# Patient Record
Sex: Male | Born: 1945
Health system: Southern US, Community
[De-identification: ages and names within clinical notes are randomized; demographics above are authoritative.]

## PROBLEM LIST (undated history)

## (undated) DIAGNOSIS — I219 Acute myocardial infarction, unspecified: Secondary | ICD-10-CM

## (undated) DIAGNOSIS — I340 Nonrheumatic mitral (valve) insufficiency: Secondary | ICD-10-CM

## (undated) DIAGNOSIS — K922 Gastrointestinal hemorrhage, unspecified: Secondary | ICD-10-CM

## (undated) DIAGNOSIS — C3490 Malignant neoplasm of unspecified part of unspecified bronchus or lung: Secondary | ICD-10-CM

## (undated) DIAGNOSIS — J9621 Acute and chronic respiratory failure with hypoxia: Secondary | ICD-10-CM

## (undated) DIAGNOSIS — J9383 Other pneumothorax: Secondary | ICD-10-CM

## (undated) DIAGNOSIS — I712 Thoracic aortic aneurysm, without rupture: Secondary | ICD-10-CM

## (undated) DIAGNOSIS — I5042 Chronic combined systolic (congestive) and diastolic (congestive) heart failure: Secondary | ICD-10-CM

## (undated) DIAGNOSIS — I5022 Chronic systolic (congestive) heart failure: Secondary | ICD-10-CM

## (undated) DIAGNOSIS — Q211 Atrial septal defect, unspecified: Secondary | ICD-10-CM

## (undated) DIAGNOSIS — I639 Cerebral infarction, unspecified: Secondary | ICD-10-CM

## (undated) DIAGNOSIS — I499 Cardiac arrhythmia, unspecified: Secondary | ICD-10-CM

## (undated) DIAGNOSIS — R51 Headache: Secondary | ICD-10-CM

## (undated) DIAGNOSIS — J189 Pneumonia, unspecified organism: Secondary | ICD-10-CM

## (undated) DIAGNOSIS — I10 Essential (primary) hypertension: Secondary | ICD-10-CM

## (undated) DIAGNOSIS — Z95810 Presence of automatic (implantable) cardiac defibrillator: Secondary | ICD-10-CM

## (undated) DIAGNOSIS — E119 Type 2 diabetes mellitus without complications: Secondary | ICD-10-CM

## (undated) DIAGNOSIS — Q2549 Other congenital malformations of aorta: Secondary | ICD-10-CM

## (undated) DIAGNOSIS — I272 Pulmonary hypertension, unspecified: Secondary | ICD-10-CM

## (undated) DIAGNOSIS — I482 Chronic atrial fibrillation, unspecified: Secondary | ICD-10-CM

## (undated) DIAGNOSIS — E785 Hyperlipidemia, unspecified: Secondary | ICD-10-CM

## (undated) DIAGNOSIS — I701 Atherosclerosis of renal artery: Secondary | ICD-10-CM

## (undated) DIAGNOSIS — I629 Nontraumatic intracranial hemorrhage, unspecified: Secondary | ICD-10-CM

## (undated) DIAGNOSIS — R9430 Abnormal result of cardiovascular function study, unspecified: Secondary | ICD-10-CM

## (undated) DIAGNOSIS — Z950 Presence of cardiac pacemaker: Secondary | ICD-10-CM

## (undated) DIAGNOSIS — J449 Chronic obstructive pulmonary disease, unspecified: Secondary | ICD-10-CM

## (undated) DIAGNOSIS — I509 Heart failure, unspecified: Secondary | ICD-10-CM

## (undated) DIAGNOSIS — K635 Polyp of colon: Secondary | ICD-10-CM

## (undated) DIAGNOSIS — I38 Endocarditis, valve unspecified: Secondary | ICD-10-CM

## (undated) DIAGNOSIS — Z953 Presence of xenogenic heart valve: Secondary | ICD-10-CM

## (undated) DIAGNOSIS — T8209XA Other mechanical complication of heart valve prosthesis, initial encounter: Secondary | ICD-10-CM

## (undated) DIAGNOSIS — I4891 Unspecified atrial fibrillation: Secondary | ICD-10-CM

## (undated) HISTORY — DX: Atherosclerosis of renal artery: I70.1

## (undated) HISTORY — DX: Other mechanical complication of heart valve prosthesis, initial encounter: T82.09XA

## (undated) HISTORY — PX: HERNIA REPAIR: SHX51

## (undated) HISTORY — DX: Chronic combined systolic (congestive) and diastolic (congestive) heart failure: I50.42

## (undated) HISTORY — DX: Chronic obstructive pulmonary disease, unspecified: J44.9

## (undated) HISTORY — DX: Nonrheumatic mitral (valve) insufficiency: I34.0

## (undated) HISTORY — PX: TOE SURGERY: SHX1073

## (undated) HISTORY — PX: PENILE PROSTHESIS IMPLANT: SHX240

## (undated) HISTORY — PX: INSERT / REPLACE / REMOVE PACEMAKER: SUR710

## (undated) HISTORY — PX: APPENDECTOMY: SHX54

## (undated) HISTORY — DX: Endocarditis, valve unspecified: I38

## (undated) HISTORY — DX: Atrial septal defect: Q21.1

## (undated) HISTORY — DX: Nontraumatic intracranial hemorrhage, unspecified: I62.9

## (undated) HISTORY — PX: CARDIAC CATHETERIZATION: SHX172

## (undated) HISTORY — PX: MASS BIOPSY: SHX5445

## (undated) HISTORY — PX: THORACOTOMY: SUR1349

## (undated) HISTORY — DX: Other pneumothorax: J93.83

## (undated) HISTORY — DX: Hyperlipidemia, unspecified: E78.5

## (undated) HISTORY — PX: CARDIAC VALVE REPLACEMENT: SHX585

## (undated) HISTORY — DX: Other congenital malformations of aorta: Q25.49

## (undated) HISTORY — DX: Unspecified atrial fibrillation: I48.91

## (undated) HISTORY — DX: Polyp of colon: K63.5

## (undated) HISTORY — PX: TONSILLECTOMY: SUR1361

## (undated) HISTORY — PX: CARDIAC DEFIBRILLATOR PLACEMENT: SHX171

## (undated) HISTORY — PX: CATARACT EXTRACTION W/ INTRAOCULAR LENS  IMPLANT, BILATERAL: SHX1307

## (undated) HISTORY — DX: Abnormal result of cardiovascular function study, unspecified: R94.30

## (undated) HISTORY — DX: Atrial septal defect, unspecified: Q21.10

## (undated) HISTORY — DX: Essential (primary) hypertension: I10

## (undated) HISTORY — DX: Presence of xenogenic heart valve: Z95.3

## (undated) HISTORY — DX: Pulmonary hypertension, unspecified: I27.20

---

## 1998-08-20 ENCOUNTER — Ambulatory Visit (HOSPITAL_COMMUNITY): Admission: RE | Admit: 1998-08-20 | Discharge: 1998-08-20 | Payer: Self-pay | Admitting: Unknown Physician Specialty

## 2000-11-27 ENCOUNTER — Encounter: Payer: Self-pay | Admitting: Family Medicine

## 2000-11-27 ENCOUNTER — Ambulatory Visit (HOSPITAL_COMMUNITY): Admission: RE | Admit: 2000-11-27 | Discharge: 2000-11-27 | Payer: Self-pay | Admitting: Family Medicine

## 2001-05-16 ENCOUNTER — Encounter: Payer: Self-pay | Admitting: Emergency Medicine

## 2001-05-16 ENCOUNTER — Inpatient Hospital Stay (HOSPITAL_COMMUNITY): Admission: EM | Admit: 2001-05-16 | Discharge: 2001-05-24 | Payer: Self-pay | Admitting: *Deleted

## 2001-05-17 ENCOUNTER — Encounter: Payer: Self-pay | Admitting: Internal Medicine

## 2001-05-19 ENCOUNTER — Encounter: Payer: Self-pay | Admitting: Internal Medicine

## 2001-05-20 ENCOUNTER — Encounter: Payer: Self-pay | Admitting: Internal Medicine

## 2001-05-21 ENCOUNTER — Encounter: Payer: Self-pay | Admitting: Internal Medicine

## 2001-05-24 ENCOUNTER — Encounter: Payer: Self-pay | Admitting: Internal Medicine

## 2001-08-31 ENCOUNTER — Emergency Department (HOSPITAL_COMMUNITY): Admission: EM | Admit: 2001-08-31 | Discharge: 2001-08-31 | Payer: Self-pay

## 2001-09-12 ENCOUNTER — Encounter: Payer: Self-pay | Admitting: Emergency Medicine

## 2001-09-12 ENCOUNTER — Inpatient Hospital Stay (HOSPITAL_COMMUNITY): Admission: EM | Admit: 2001-09-12 | Discharge: 2001-09-19 | Payer: Self-pay | Admitting: Emergency Medicine

## 2001-09-14 ENCOUNTER — Encounter: Payer: Self-pay | Admitting: Family Medicine

## 2003-04-29 ENCOUNTER — Emergency Department (HOSPITAL_COMMUNITY): Admission: EM | Admit: 2003-04-29 | Discharge: 2003-04-29 | Payer: Self-pay | Admitting: Emergency Medicine

## 2003-07-18 ENCOUNTER — Encounter: Admission: RE | Admit: 2003-07-18 | Discharge: 2003-07-18 | Payer: Self-pay | Admitting: General Surgery

## 2004-07-14 ENCOUNTER — Ambulatory Visit: Payer: Self-pay | Admitting: Family Medicine

## 2004-08-02 ENCOUNTER — Ambulatory Visit: Payer: Self-pay | Admitting: Family Medicine

## 2004-08-30 ENCOUNTER — Ambulatory Visit: Payer: Self-pay | Admitting: Family Medicine

## 2004-09-17 ENCOUNTER — Ambulatory Visit: Payer: Self-pay | Admitting: Internal Medicine

## 2004-09-23 ENCOUNTER — Ambulatory Visit: Payer: Self-pay | Admitting: Family Medicine

## 2004-10-28 ENCOUNTER — Ambulatory Visit: Payer: Self-pay | Admitting: Family Medicine

## 2004-12-14 ENCOUNTER — Ambulatory Visit: Payer: Self-pay | Admitting: Family Medicine

## 2005-01-13 ENCOUNTER — Ambulatory Visit: Payer: Self-pay | Admitting: Family Medicine

## 2005-03-01 ENCOUNTER — Ambulatory Visit: Payer: Self-pay | Admitting: Family Medicine

## 2005-03-31 ENCOUNTER — Ambulatory Visit: Payer: Self-pay | Admitting: Family Medicine

## 2005-05-04 ENCOUNTER — Ambulatory Visit: Payer: Self-pay | Admitting: Family Medicine

## 2005-05-11 ENCOUNTER — Ambulatory Visit: Payer: Self-pay | Admitting: Family Medicine

## 2005-06-06 ENCOUNTER — Ambulatory Visit: Payer: Self-pay | Admitting: Family Medicine

## 2005-07-07 ENCOUNTER — Ambulatory Visit: Payer: Self-pay | Admitting: Family Medicine

## 2005-09-30 ENCOUNTER — Ambulatory Visit: Payer: Self-pay | Admitting: Family Medicine

## 2005-09-30 ENCOUNTER — Encounter: Admission: RE | Admit: 2005-09-30 | Discharge: 2005-09-30 | Payer: Self-pay | Admitting: Family Medicine

## 2005-10-27 ENCOUNTER — Ambulatory Visit: Payer: Self-pay | Admitting: Family Medicine

## 2005-11-03 ENCOUNTER — Ambulatory Visit: Payer: Self-pay | Admitting: Family Medicine

## 2005-11-10 ENCOUNTER — Ambulatory Visit: Payer: Self-pay | Admitting: Family Medicine

## 2005-11-18 ENCOUNTER — Ambulatory Visit: Payer: Self-pay | Admitting: Gastroenterology

## 2005-12-14 ENCOUNTER — Encounter: Payer: Self-pay | Admitting: Gastroenterology

## 2005-12-14 ENCOUNTER — Ambulatory Visit: Payer: Self-pay | Admitting: Gastroenterology

## 2005-12-28 ENCOUNTER — Ambulatory Visit: Payer: Self-pay | Admitting: Family Medicine

## 2006-01-13 ENCOUNTER — Ambulatory Visit: Payer: Self-pay | Admitting: Family Medicine

## 2006-02-27 ENCOUNTER — Ambulatory Visit: Payer: Self-pay | Admitting: Family Medicine

## 2006-03-17 ENCOUNTER — Ambulatory Visit: Payer: Self-pay | Admitting: Family Medicine

## 2006-07-19 ENCOUNTER — Ambulatory Visit: Payer: Self-pay | Admitting: Family Medicine

## 2006-07-19 LAB — CONVERTED CEMR LAB: Microalb, Ur: 13.3 mg/dL — ABNORMAL HIGH (ref 0.0–1.9)

## 2006-08-15 ENCOUNTER — Ambulatory Visit: Payer: Self-pay | Admitting: Family Medicine

## 2006-09-14 ENCOUNTER — Emergency Department (HOSPITAL_COMMUNITY): Admission: EM | Admit: 2006-09-14 | Discharge: 2006-09-14 | Payer: Self-pay | Admitting: Emergency Medicine

## 2006-10-12 ENCOUNTER — Ambulatory Visit: Payer: Self-pay | Admitting: Family Medicine

## 2006-10-12 LAB — CONVERTED CEMR LAB
AST: 14 units/L (ref 0–37)
Albumin: 4.2 g/dL (ref 3.5–5.2)
Alkaline Phosphatase: 75 units/L (ref 39–117)
BUN: 12 mg/dL (ref 6–23)
Basophils Absolute: 0 10*3/uL (ref 0.0–0.1)
Calcium: 9.2 mg/dL (ref 8.4–10.5)
Chloride: 98 meq/L (ref 96–112)
Cholesterol: 134 mg/dL (ref 0–200)
Creatinine, Ser: 0.8 mg/dL (ref 0.4–1.5)
Eosinophils Absolute: 0.1 10*3/uL (ref 0.0–0.6)
GFR calc non Af Amer: 105 mL/min
HCT: 50.7 % (ref 39.0–52.0)
HDL: 38.7 mg/dL — ABNORMAL LOW (ref >39.0)
LDL Cholesterol: 72 mg/dL (ref 0–99)
MCHC: 35 g/dL (ref 30.0–36.0)
Monocytes Relative: 7.8 % (ref 3.0–11.0)
PSA: 0.73 ng/mL
PSA: 0.73 ng/mL (ref 0.10–4.00)
Platelets: 146 10*3/uL — ABNORMAL LOW (ref 150–400)
RBC: 5.41 M/uL (ref 4.22–5.81)
RDW: 14 % (ref 11.5–14.6)
Total Bilirubin: 3.4 mg/dL — ABNORMAL HIGH (ref 0.3–1.2)
Total CHOL/HDL Ratio: 3.5
Triglycerides: 115 mg/dL (ref 0–149)

## 2006-11-09 ENCOUNTER — Ambulatory Visit: Payer: Self-pay | Admitting: Family Medicine

## 2006-12-11 ENCOUNTER — Ambulatory Visit: Payer: Self-pay | Admitting: Family Medicine

## 2006-12-18 ENCOUNTER — Ambulatory Visit: Payer: Self-pay | Admitting: Family Medicine

## 2007-01-11 ENCOUNTER — Encounter: Payer: Self-pay | Admitting: Family Medicine

## 2007-01-11 ENCOUNTER — Ambulatory Visit: Payer: Self-pay | Admitting: Family Medicine

## 2007-01-11 DIAGNOSIS — K219 Gastro-esophageal reflux disease without esophagitis: Secondary | ICD-10-CM

## 2007-01-11 DIAGNOSIS — J449 Chronic obstructive pulmonary disease, unspecified: Secondary | ICD-10-CM

## 2007-02-07 ENCOUNTER — Ambulatory Visit: Payer: Self-pay | Admitting: Gastroenterology

## 2007-02-12 ENCOUNTER — Ambulatory Visit: Payer: Self-pay | Admitting: Family Medicine

## 2007-02-12 LAB — CONVERTED CEMR LAB: INR: 2.4

## 2007-03-15 ENCOUNTER — Ambulatory Visit: Payer: Self-pay | Admitting: Family Medicine

## 2007-03-15 LAB — CONVERTED CEMR LAB
INR: 2.2
Prothrombin Time: 18 s

## 2007-03-22 ENCOUNTER — Encounter: Payer: Self-pay | Admitting: Family Medicine

## 2007-03-22 ENCOUNTER — Ambulatory Visit: Payer: Self-pay | Admitting: Gastroenterology

## 2007-03-22 ENCOUNTER — Encounter: Payer: Self-pay | Admitting: Gastroenterology

## 2007-03-22 DIAGNOSIS — D126 Benign neoplasm of colon, unspecified: Secondary | ICD-10-CM | POA: Insufficient documentation

## 2007-04-19 ENCOUNTER — Ambulatory Visit: Payer: Self-pay | Admitting: Family Medicine

## 2007-05-01 IMAGING — CR DG CHEST 2V
2 series · 2 of 2 positions shown · non-contrast
Comparison: 07/18/03.

CLINICAL DATA: Cough.    Atrial fibrillation.
 CHEST X-RAY:

[w chest pa]
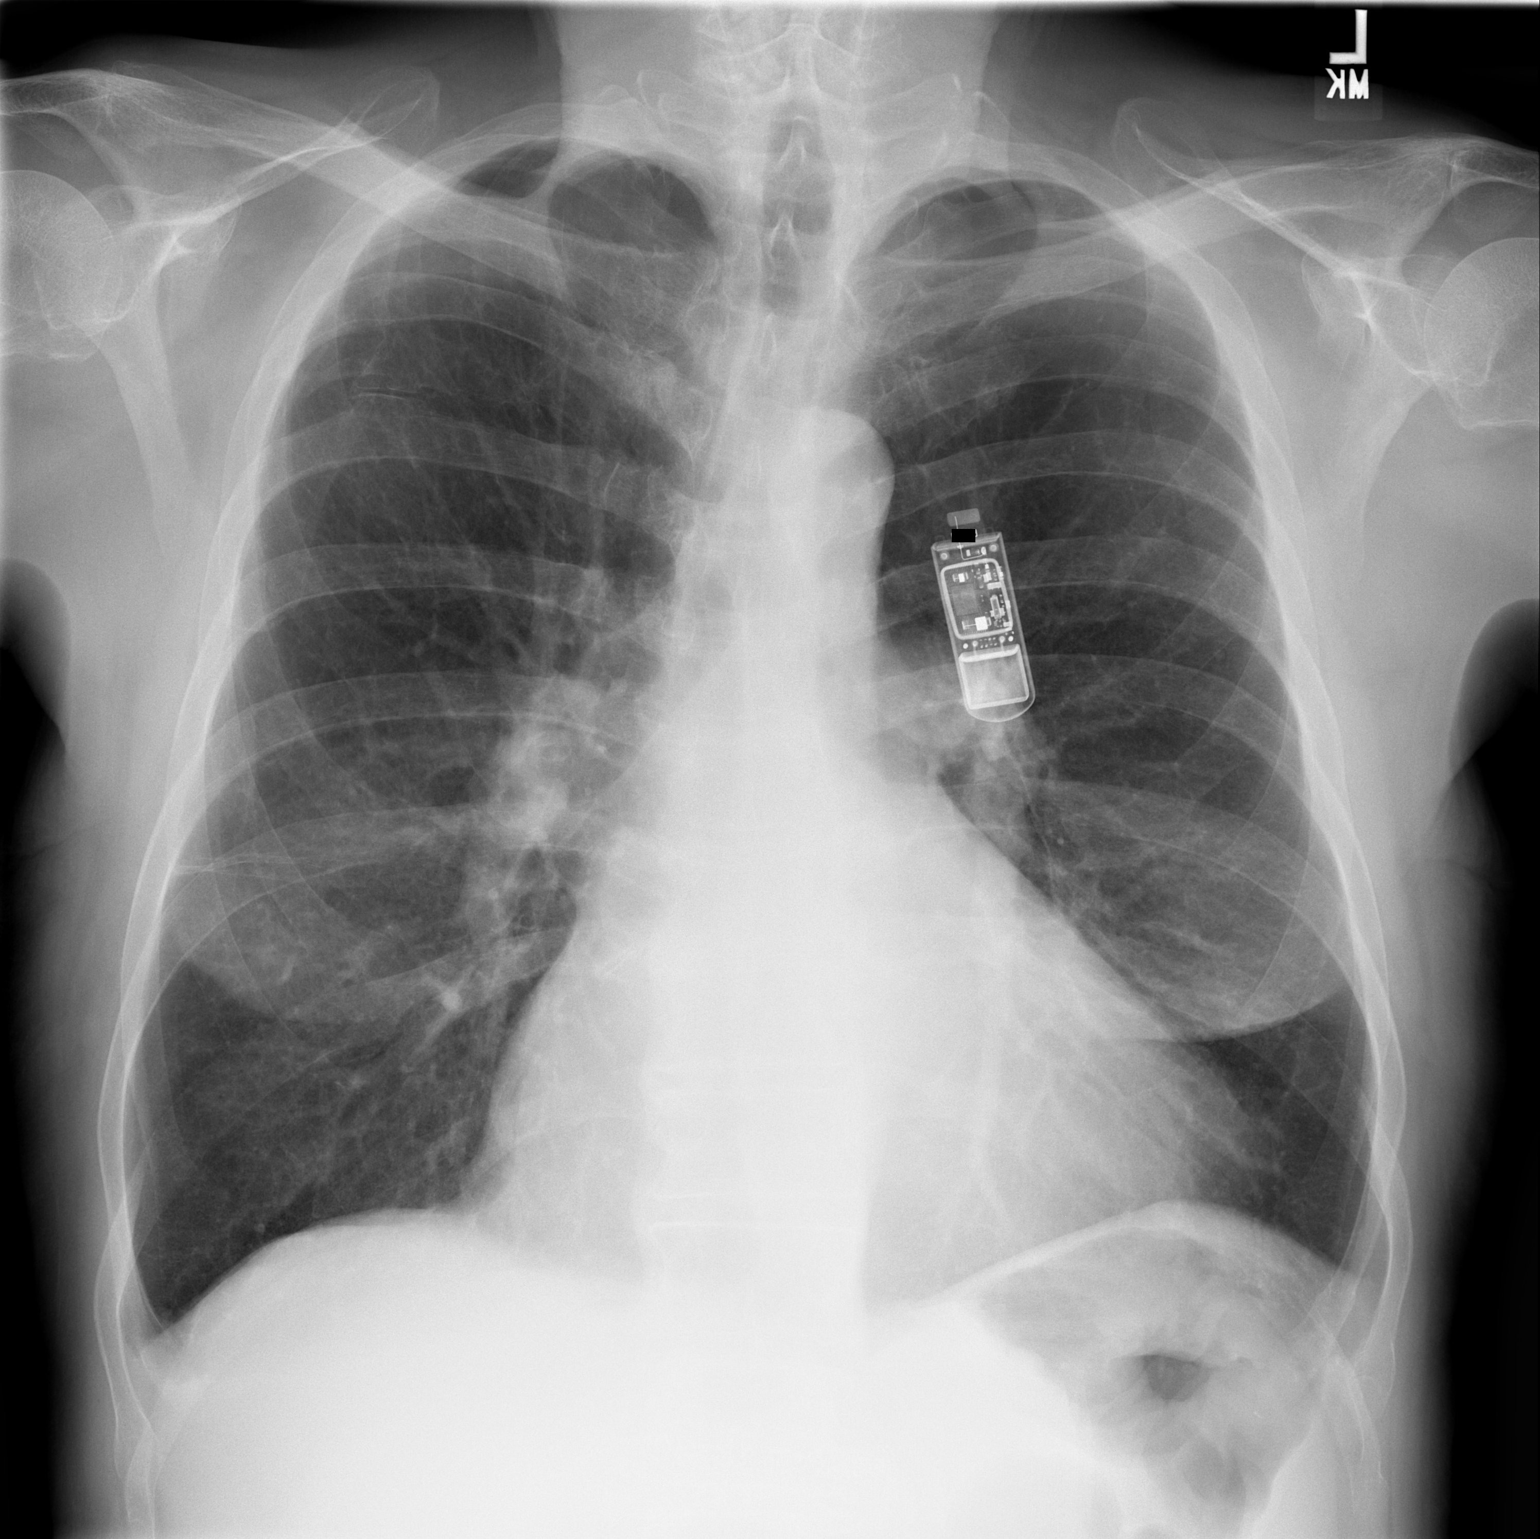

[w chest lat]
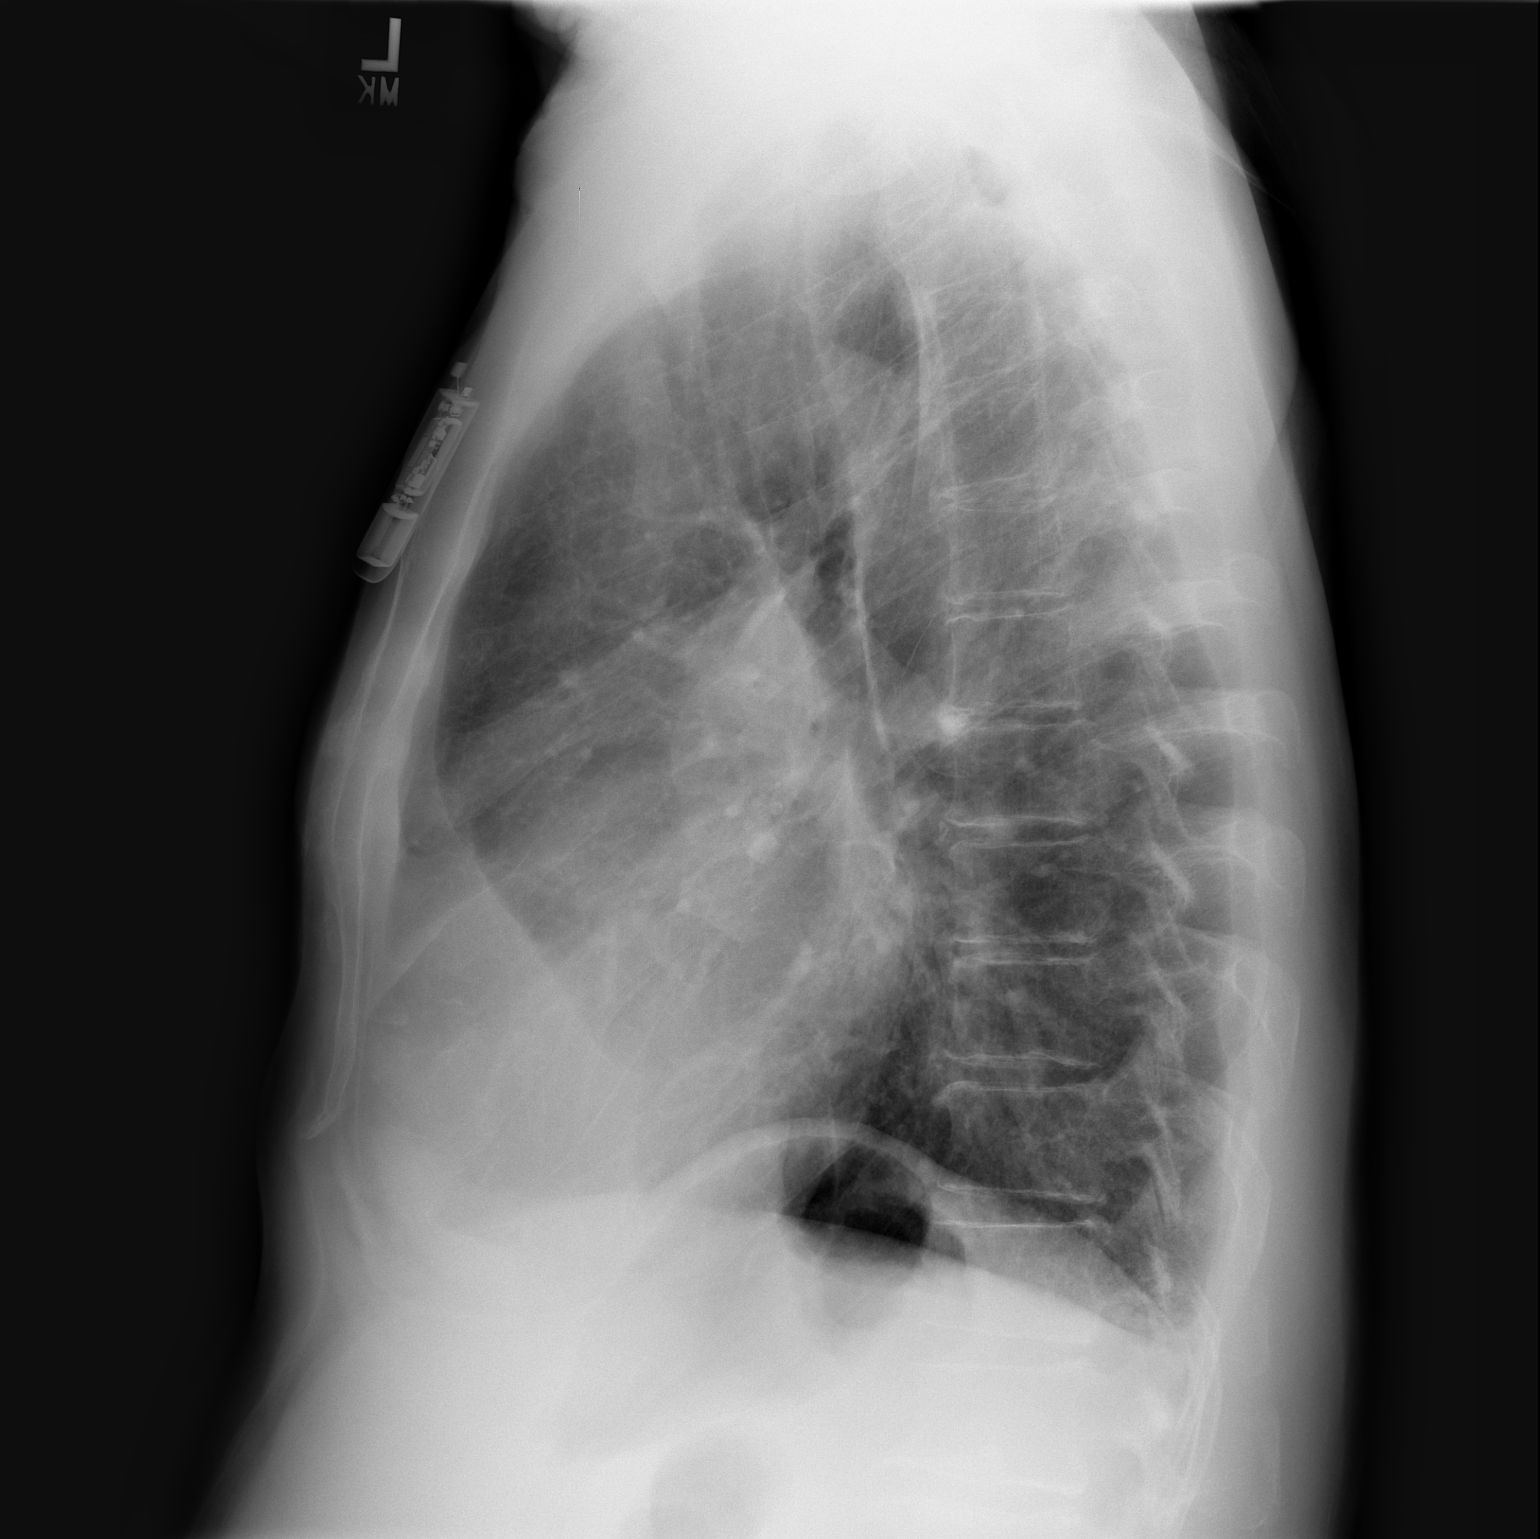

[2 of 2 positions shown; findings below may reference images not displayed]

The lungs are hyperaerated consistent with COPD.  Cardiomegaly is stable.  A loop recorder is noted overlying the anterior left chest.  No bony abnormality is seen.
IMPRESSION: Stable moderate cardiomegaly with COPD.  No active lung disease.

## 2007-05-21 ENCOUNTER — Ambulatory Visit: Payer: Self-pay | Admitting: Family Medicine

## 2007-05-21 ENCOUNTER — Encounter: Payer: Self-pay | Admitting: Family Medicine

## 2007-05-21 LAB — CONVERTED CEMR LAB
INR: 3.4
Prothrombin Time: 22.2 s

## 2007-06-18 ENCOUNTER — Ambulatory Visit: Payer: Self-pay | Admitting: Family Medicine

## 2007-06-18 LAB — CONVERTED CEMR LAB: Prothrombin Time: 19.5 s

## 2007-07-12 DIAGNOSIS — I639 Cerebral infarction, unspecified: Secondary | ICD-10-CM

## 2007-07-12 HISTORY — DX: Cerebral infarction, unspecified: I63.9

## 2007-07-16 ENCOUNTER — Ambulatory Visit: Payer: Self-pay | Admitting: Family Medicine

## 2007-07-16 LAB — CONVERTED CEMR LAB
INR: 3.2
Prothrombin Time: 21.5 s

## 2007-08-17 ENCOUNTER — Ambulatory Visit: Payer: Self-pay | Admitting: Family Medicine

## 2007-09-14 ENCOUNTER — Ambulatory Visit: Payer: Self-pay | Admitting: Family Medicine

## 2007-09-14 LAB — CONVERTED CEMR LAB: INR: 2.7

## 2007-10-12 ENCOUNTER — Ambulatory Visit: Payer: Self-pay | Admitting: Family Medicine

## 2007-10-12 LAB — CONVERTED CEMR LAB: Prothrombin Time: 20.4 s

## 2007-11-08 ENCOUNTER — Ambulatory Visit: Payer: Self-pay | Admitting: Family Medicine

## 2007-11-08 LAB — CONVERTED CEMR LAB: INR: 2.7

## 2007-11-10 ENCOUNTER — Ambulatory Visit: Payer: Self-pay | Admitting: Cardiology

## 2007-11-10 ENCOUNTER — Inpatient Hospital Stay (HOSPITAL_COMMUNITY): Admission: AD | Admit: 2007-11-10 | Discharge: 2007-11-15 | Payer: Self-pay | Admitting: Neurology

## 2007-11-10 ENCOUNTER — Encounter: Payer: Self-pay | Admitting: Emergency Medicine

## 2007-11-12 ENCOUNTER — Telehealth: Payer: Self-pay | Admitting: Family Medicine

## 2007-11-12 ENCOUNTER — Encounter (INDEPENDENT_AMBULATORY_CARE_PROVIDER_SITE_OTHER): Payer: Self-pay | Admitting: Neurology

## 2007-11-16 ENCOUNTER — Encounter: Admission: RE | Admit: 2007-11-16 | Discharge: 2008-02-14 | Payer: Self-pay | Admitting: Neurology

## 2007-11-29 ENCOUNTER — Ambulatory Visit: Payer: Self-pay

## 2007-12-11 ENCOUNTER — Ambulatory Visit: Payer: Self-pay | Admitting: Family Medicine

## 2007-12-27 ENCOUNTER — Telehealth: Payer: Self-pay | Admitting: *Deleted

## 2008-01-07 ENCOUNTER — Ambulatory Visit: Payer: Self-pay | Admitting: Cardiology

## 2008-01-16 ENCOUNTER — Encounter: Admission: RE | Admit: 2008-01-16 | Discharge: 2008-01-16 | Payer: Self-pay | Admitting: Neurology

## 2008-02-29 ENCOUNTER — Telehealth: Payer: Self-pay | Admitting: Gastroenterology

## 2008-03-20 ENCOUNTER — Telehealth: Payer: Self-pay | Admitting: Gastroenterology
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
## 2008-04-25 ENCOUNTER — Encounter: Payer: Self-pay | Admitting: Cardiology

## 2008-04-25 ENCOUNTER — Inpatient Hospital Stay (HOSPITAL_COMMUNITY): Admission: EM | Admit: 2008-04-25 | Discharge: 2008-05-08 | Payer: Self-pay | Admitting: Emergency Medicine

## 2008-04-25 ENCOUNTER — Ambulatory Visit: Payer: Self-pay | Admitting: *Deleted

## 2008-05-01 ENCOUNTER — Ambulatory Visit: Payer: Self-pay | Admitting: Internal Medicine

## 2008-05-01 ENCOUNTER — Encounter: Payer: Self-pay | Admitting: Cardiology

## 2008-05-29 ENCOUNTER — Ambulatory Visit: Payer: Self-pay | Admitting: Cardiology

## 2008-05-29 ENCOUNTER — Ambulatory Visit: Payer: Self-pay

## 2008-05-29 LAB — CONVERTED CEMR LAB
BUN: 16 mg/dL (ref 6–23)
Chloride: 97 meq/L (ref 96–112)
GFR calc Af Amer: 111 mL/min
GFR calc non Af Amer: 91 mL/min
Potassium: 4.3 meq/L (ref 3.5–5.1)
Sodium: 134 meq/L — ABNORMAL LOW (ref 135–145)

## 2008-06-07 ENCOUNTER — Inpatient Hospital Stay (HOSPITAL_COMMUNITY): Admission: EM | Admit: 2008-06-07 | Discharge: 2008-06-09 | Payer: Self-pay | Admitting: Emergency Medicine

## 2008-06-07 ENCOUNTER — Ambulatory Visit: Payer: Self-pay | Admitting: *Deleted

## 2008-06-10 ENCOUNTER — Ambulatory Visit: Payer: Self-pay

## 2008-06-10 ENCOUNTER — Encounter: Payer: Self-pay | Admitting: Infectious Diseases

## 2008-06-10 ENCOUNTER — Ambulatory Visit: Payer: Self-pay | Admitting: Thoracic Surgery (Cardiothoracic Vascular Surgery)

## 2008-06-10 ENCOUNTER — Ambulatory Visit: Payer: Self-pay | Admitting: Cardiology

## 2008-06-10 ENCOUNTER — Inpatient Hospital Stay (HOSPITAL_COMMUNITY): Admission: AD | Admit: 2008-06-10 | Discharge: 2008-06-20 | Payer: Self-pay | Admitting: Cardiology

## 2008-06-11 ENCOUNTER — Ambulatory Visit: Payer: Self-pay | Admitting: Infectious Diseases

## 2008-06-11 ENCOUNTER — Encounter: Payer: Self-pay | Admitting: Cardiology

## 2008-06-11 ENCOUNTER — Ambulatory Visit: Payer: Self-pay | Admitting: Surgery

## 2008-06-12 ENCOUNTER — Encounter: Payer: Self-pay | Admitting: Cardiology

## 2008-06-12 ENCOUNTER — Encounter: Payer: Self-pay | Admitting: Internal Medicine

## 2008-06-16 ENCOUNTER — Ambulatory Visit: Payer: Self-pay | Admitting: Dentistry

## 2008-06-17 ENCOUNTER — Encounter: Payer: Self-pay | Admitting: Thoracic Surgery (Cardiothoracic Vascular Surgery)

## 2008-06-18 ENCOUNTER — Encounter: Payer: Self-pay | Admitting: Cardiology

## 2008-06-25 ENCOUNTER — Ambulatory Visit: Payer: Self-pay | Admitting: Cardiology

## 2008-06-25 LAB — CONVERTED CEMR LAB
CO2: 33 meq/L — ABNORMAL HIGH (ref 19–32)
Chloride: 101 meq/L (ref 96–112)
GFR calc non Af Amer: 104 mL/min
Potassium: 5.5 meq/L — ABNORMAL HIGH (ref 3.5–5.1)
Sodium: 137 meq/L (ref 135–145)

## 2008-07-01 ENCOUNTER — Ambulatory Visit: Payer: Self-pay | Admitting: Cardiovascular Disease

## 2008-07-11 DIAGNOSIS — I219 Acute myocardial infarction, unspecified: Secondary | ICD-10-CM

## 2008-07-11 HISTORY — PX: AV NODE ABLATION: SHX1209

## 2008-07-11 HISTORY — DX: Acute myocardial infarction, unspecified: I21.9

## 2008-07-14 ENCOUNTER — Ambulatory Visit: Payer: Self-pay | Admitting: Internal Medicine

## 2008-07-14 ENCOUNTER — Inpatient Hospital Stay (HOSPITAL_COMMUNITY): Admission: AD | Admit: 2008-07-14 | Discharge: 2008-07-28 | Payer: Self-pay | Admitting: Internal Medicine

## 2008-07-14 ENCOUNTER — Ambulatory Visit: Payer: Self-pay | Admitting: Pulmonary Disease

## 2008-07-16 ENCOUNTER — Ambulatory Visit: Payer: Self-pay | Admitting: Thoracic Surgery (Cardiothoracic Vascular Surgery)

## 2008-07-17 ENCOUNTER — Other Ambulatory Visit: Payer: Self-pay | Admitting: Thoracic Surgery (Cardiothoracic Vascular Surgery)

## 2008-07-17 ENCOUNTER — Encounter: Payer: Self-pay | Admitting: Thoracic Surgery (Cardiothoracic Vascular Surgery)

## 2008-07-17 HISTORY — PX: MITRAL VALVE REPLACEMENT: SHX147

## 2008-07-17 HISTORY — PX: ASD REPAIR, SECUNDUM: SHX1195

## 2008-07-22 ENCOUNTER — Encounter: Payer: Self-pay | Admitting: Internal Medicine

## 2008-08-04 ENCOUNTER — Ambulatory Visit: Payer: Self-pay | Admitting: Thoracic Surgery (Cardiothoracic Vascular Surgery)

## 2008-08-04 ENCOUNTER — Encounter
Admission: RE | Admit: 2008-08-04 | Discharge: 2008-08-04 | Payer: Self-pay | Admitting: Thoracic Surgery (Cardiothoracic Vascular Surgery)

## 2008-08-07 ENCOUNTER — Ambulatory Visit: Payer: Self-pay

## 2008-08-07 ENCOUNTER — Encounter (INDEPENDENT_AMBULATORY_CARE_PROVIDER_SITE_OTHER): Payer: Self-pay | Admitting: *Deleted

## 2008-08-12 ENCOUNTER — Ambulatory Visit: Payer: Self-pay | Admitting: Cardiology

## 2008-08-13 ENCOUNTER — Ambulatory Visit: Payer: Self-pay | Admitting: Infectious Diseases

## 2008-08-27 ENCOUNTER — Telehealth: Payer: Self-pay | Admitting: *Deleted

## 2008-09-15 ENCOUNTER — Ambulatory Visit: Payer: Self-pay | Admitting: Thoracic Surgery (Cardiothoracic Vascular Surgery)

## 2008-10-24 ENCOUNTER — Ambulatory Visit: Payer: Self-pay | Admitting: Internal Medicine

## 2008-10-24 ENCOUNTER — Encounter: Payer: Self-pay | Admitting: Internal Medicine

## 2008-11-13 ENCOUNTER — Encounter: Payer: Self-pay | Admitting: Cardiology

## 2008-12-04 ENCOUNTER — Telehealth: Payer: Self-pay | Admitting: Cardiology

## 2008-12-04 ENCOUNTER — Ambulatory Visit: Payer: Self-pay | Admitting: Cardiology

## 2009-02-06 ENCOUNTER — Encounter (INDEPENDENT_AMBULATORY_CARE_PROVIDER_SITE_OTHER): Payer: Self-pay | Admitting: *Deleted

## 2009-02-11 ENCOUNTER — Telehealth: Payer: Self-pay | Admitting: Internal Medicine

## 2009-02-20 ENCOUNTER — Encounter: Payer: Self-pay | Admitting: Internal Medicine

## 2009-02-27 ENCOUNTER — Telehealth (INDEPENDENT_AMBULATORY_CARE_PROVIDER_SITE_OTHER): Payer: Self-pay | Admitting: *Deleted

## 2009-03-19 ENCOUNTER — Telehealth (INDEPENDENT_AMBULATORY_CARE_PROVIDER_SITE_OTHER): Payer: Self-pay | Admitting: *Deleted

## 2009-03-25 ENCOUNTER — Ambulatory Visit: Payer: Self-pay | Admitting: Family Medicine

## 2009-03-25 LAB — CONVERTED CEMR LAB
Albumin: 4.4 g/dL (ref 3.5–5.2)
BUN: 18 mg/dL (ref 6–23)
Basophils Absolute: 0 10*3/uL (ref 0.0–0.1)
Bilirubin Urine: NEGATIVE
Blood in Urine, dipstick: NEGATIVE
CO2: 33 meq/L — ABNORMAL HIGH (ref 19–32)
Calcium: 9.7 mg/dL (ref 8.4–10.5)
Cholesterol: 143 mg/dL (ref 0–200)
Eosinophils Absolute: 0.3 10*3/uL (ref 0.0–0.7)
Glucose, Bld: 171 mg/dL — ABNORMAL HIGH (ref 70–99)
Glucose, Urine, Semiquant: NEGATIVE
HDL: 42.2 mg/dL (ref >39.00)
Hemoglobin: 17.4 g/dL — ABNORMAL HIGH (ref 13.0–17.0)
Ketones, urine, test strip: NEGATIVE
Lymphocytes Relative: 12.3 % (ref 12.0–46.0)
Lymphs Abs: 1.1 10*3/uL (ref 0.7–4.0)
MCHC: 34.1 g/dL (ref 30.0–36.0)
Neutro Abs: 7.2 10*3/uL (ref 1.4–7.7)
Platelets: 161 10*3/uL (ref 150.0–400.0)
Protein, U semiquant: NEGATIVE
RDW: 14.5 % (ref 11.5–14.6)
Sodium: 145 meq/L (ref 135–145)
Specific Gravity, Urine: 1.02
TSH: 2.04 microintl units/mL (ref 0.35–5.50)
Total Bilirubin: 2.8 mg/dL — ABNORMAL HIGH (ref 0.3–1.2)
Triglycerides: 128 mg/dL (ref 0.0–149.0)
VLDL: 25.6 mg/dL (ref 0.0–40.0)
WBC Urine, dipstick: NEGATIVE
pH: 6

## 2009-03-31 ENCOUNTER — Telehealth (INDEPENDENT_AMBULATORY_CARE_PROVIDER_SITE_OTHER): Payer: Self-pay | Admitting: *Deleted

## 2009-04-01 ENCOUNTER — Ambulatory Visit: Payer: Self-pay | Admitting: Family Medicine

## 2009-04-01 DIAGNOSIS — R7309 Other abnormal glucose: Secondary | ICD-10-CM

## 2009-04-03 LAB — CONVERTED CEMR LAB
Creatinine,U: 39.3 mg/dL
GFR calc non Af Amer: 90.93 mL/min (ref >60)
Hgb A1c MFr Bld: 6.3 % (ref 4.6–6.5)
Microalb Creat Ratio: 63.6 mg/g — ABNORMAL HIGH (ref 0.0–30.0)
Microalb, Ur: 2.5 mg/dL — ABNORMAL HIGH (ref 0.0–1.9)
Potassium: 4.1 meq/L (ref 3.5–5.1)
Sodium: 141 meq/L (ref 135–145)

## 2009-04-14 ENCOUNTER — Ambulatory Visit: Payer: Self-pay | Admitting: Family Medicine

## 2009-04-17 ENCOUNTER — Encounter: Payer: Self-pay | Admitting: Internal Medicine

## 2009-04-20 ENCOUNTER — Telehealth: Payer: Self-pay | Admitting: Family Medicine

## 2009-04-20 ENCOUNTER — Ambulatory Visit: Payer: Self-pay | Admitting: Internal Medicine

## 2009-04-20 DIAGNOSIS — C3412 Malignant neoplasm of upper lobe, left bronchus or lung: Secondary | ICD-10-CM

## 2009-04-29 ENCOUNTER — Encounter: Payer: Self-pay | Admitting: Internal Medicine

## 2009-06-09 ENCOUNTER — Ambulatory Visit: Payer: Self-pay | Admitting: Cardiology

## 2009-06-10 IMAGING — CT CT HEAD W/O CM
1 series · 16 of 30 positions shown, 20 images · non-contrast
Comparison: None

CLINICAL DATA: Headache.  On Coumadin.

CT HEAD WITHOUT CONTRAST
TECHNIQUE: Contiguous axial images were obtained from the base of
the skull through the vertex without contrast.

[Series 2: headseq 4.8 h45s · axial · 0.43mm/px · z∈[-178,-26]mm · 16 of 36 slices shown, 20 images]
[im 2/36  brain]
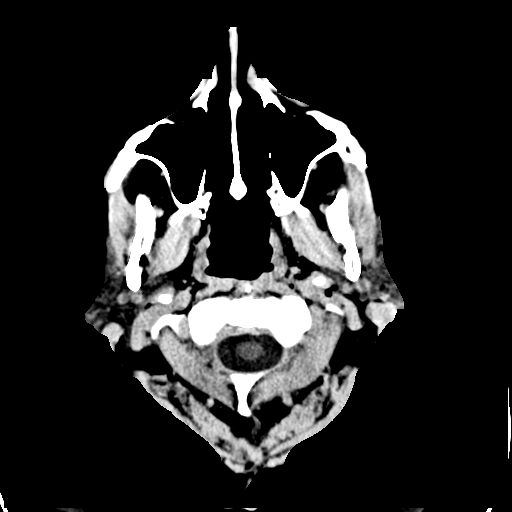
[im 2/36  bone]
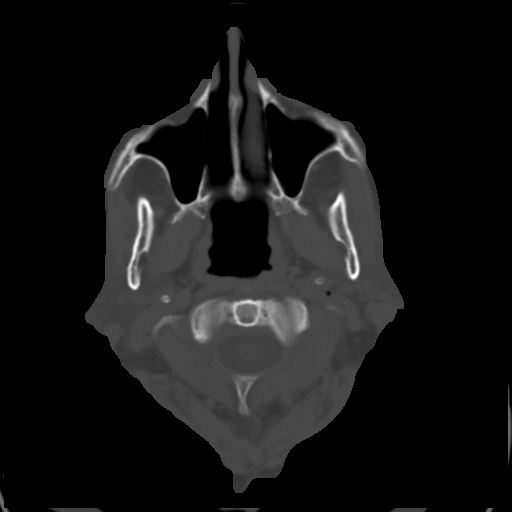
[im 4/36  brain]
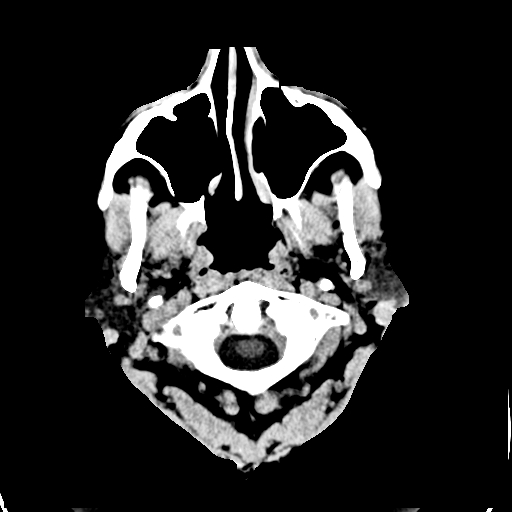
[im 7/36  brain]
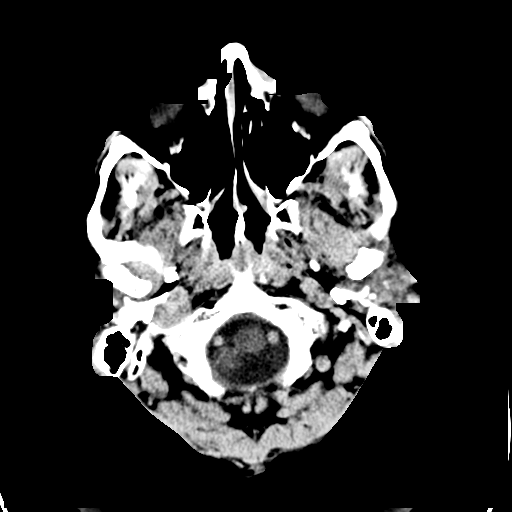
[im 9/36  brain]
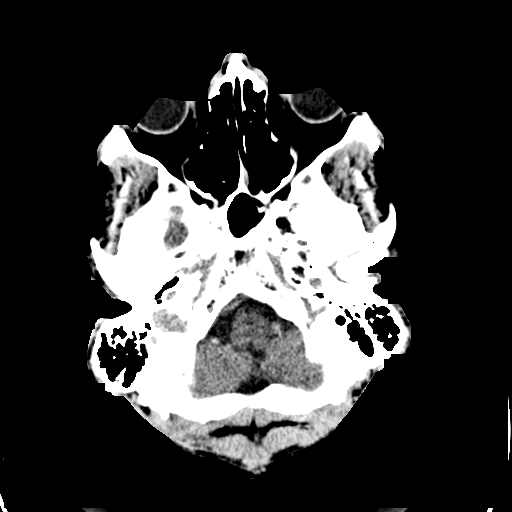
[im 10/36  brain]
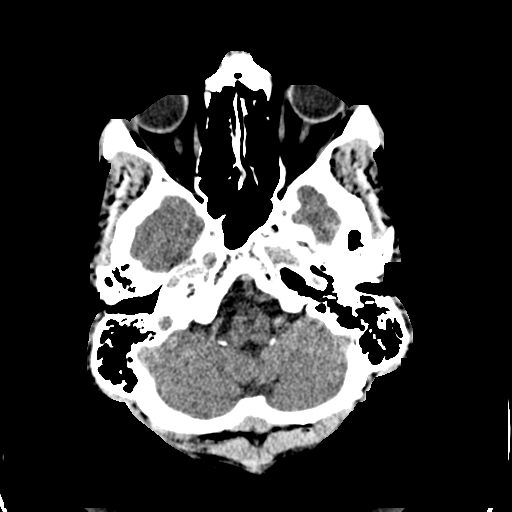
[im 10/36  bone]
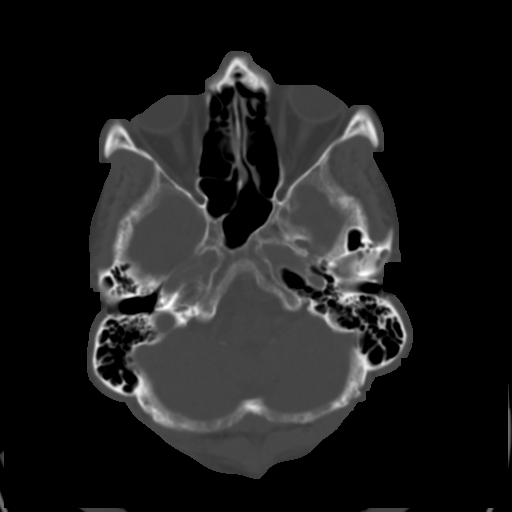
[im 13/36  brain]
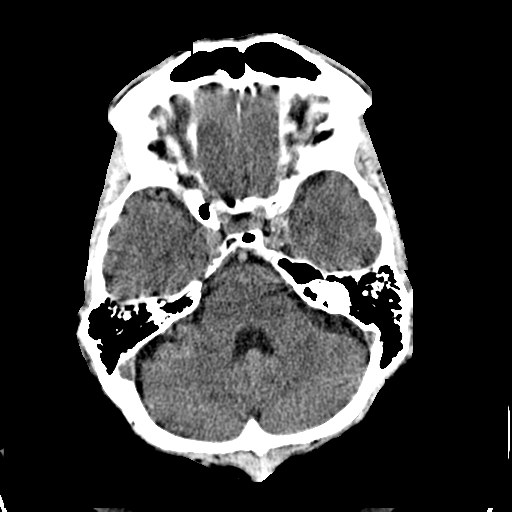
[im 15/36  brain]
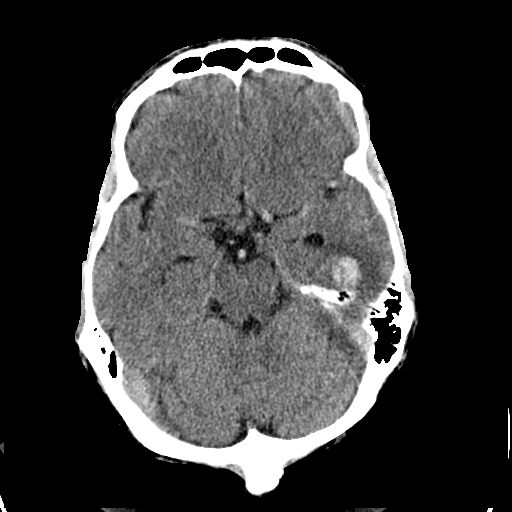
[im 17/36  brain]
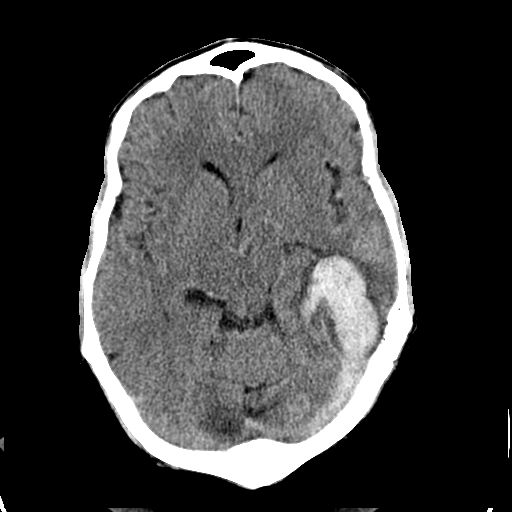
[im 19/36  brain]
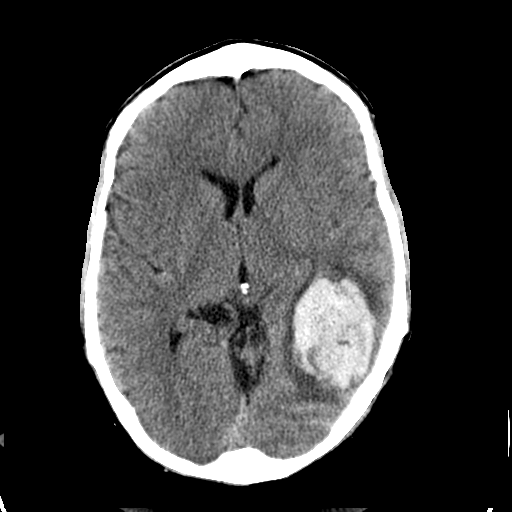
[im 19/36  bone]
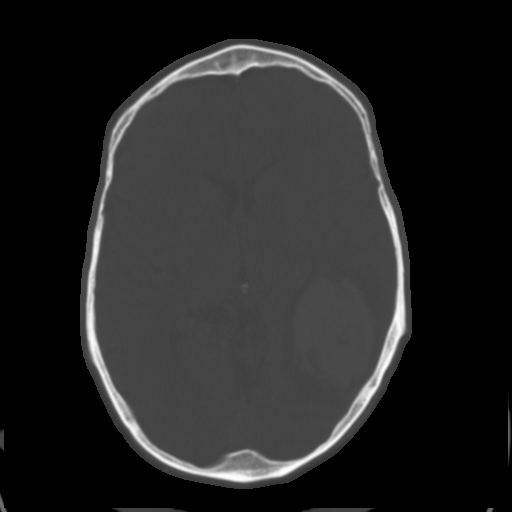
[im 21/36  brain]
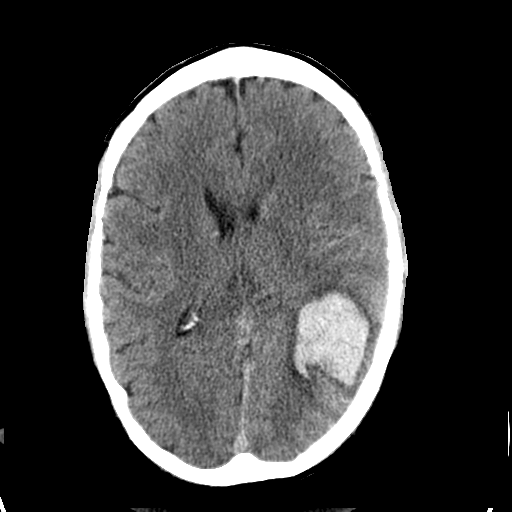
[im 23/36  brain]
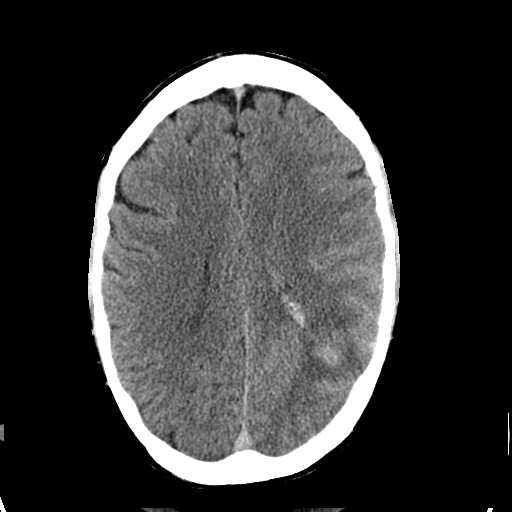
[im 26/36  brain]
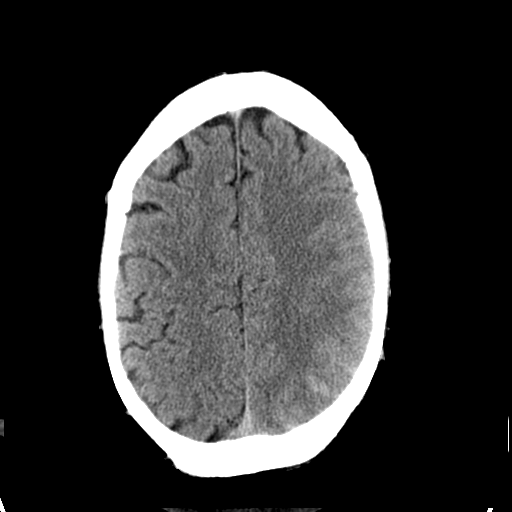
[im 27/36  brain]
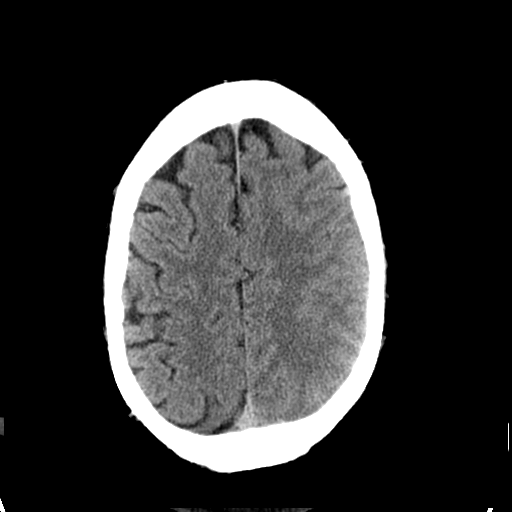
[im 27/36  bone]
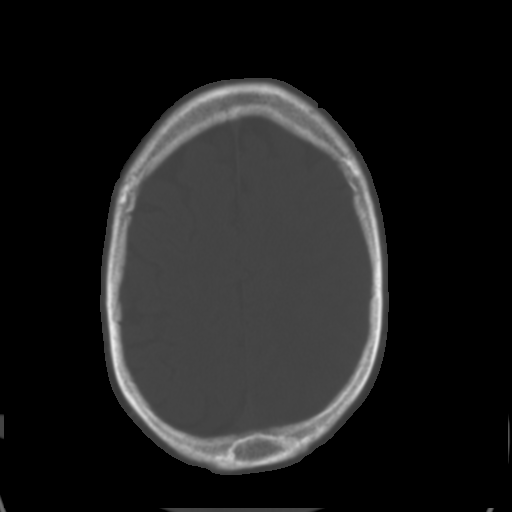
[im 29/36  brain]
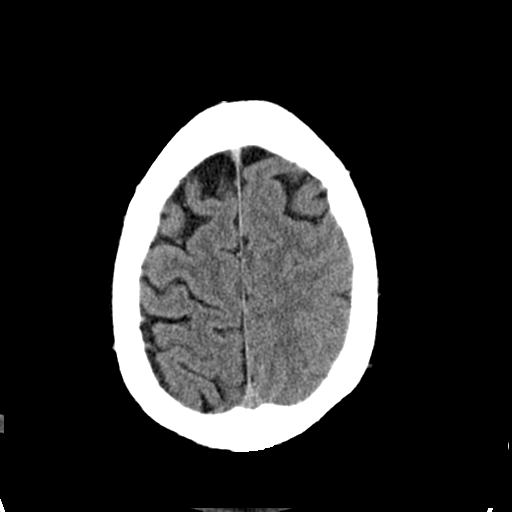
[im 32/36  brain]
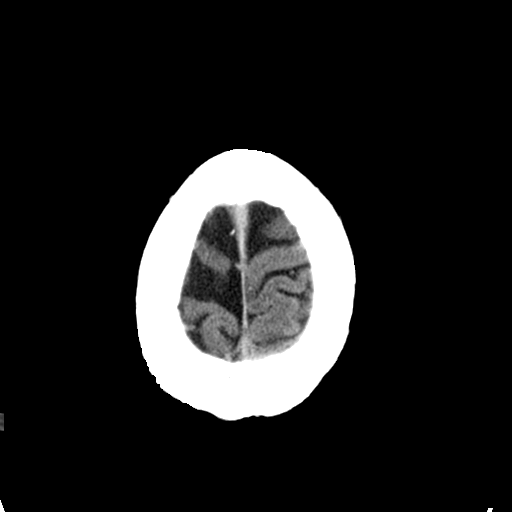
[im 34/36  brain]
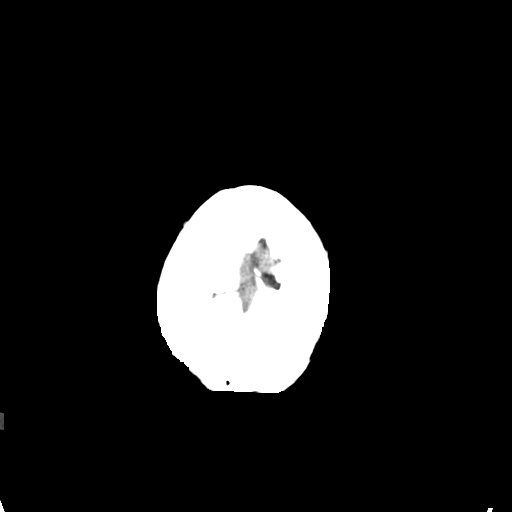

[16 of 30 positions shown; findings below may reference images not displayed]

FINDINGS: A 3.7 x 5 cm hematoma within the left temporoparietal
region is noted with adjacent edema and mass effect.  A small
amount of subarachnoid hemorrhage in the the left parietal region
is also identified.

No evidence of midline shift.

No evidence of cortical infarct or hydrocephalus.

The visualized bony calvarium is unremarkable.
IMPRESSION: 3.7 x 5 cm left temporoparietal hematoma with small amount of
adjacent subarachnoid hemorrhage.

Dr. Kushkaki notified of these results on 11/10/2007 at [DATE] p.m..

## 2009-06-10 IMAGING — CR DG CHEST 2V
2 series · 2 of 2 positions shown · non-contrast
Comparison: 09/16/2006

CLINICAL DATA: Headache

CHEST - 2 VIEW

[w chest pa]
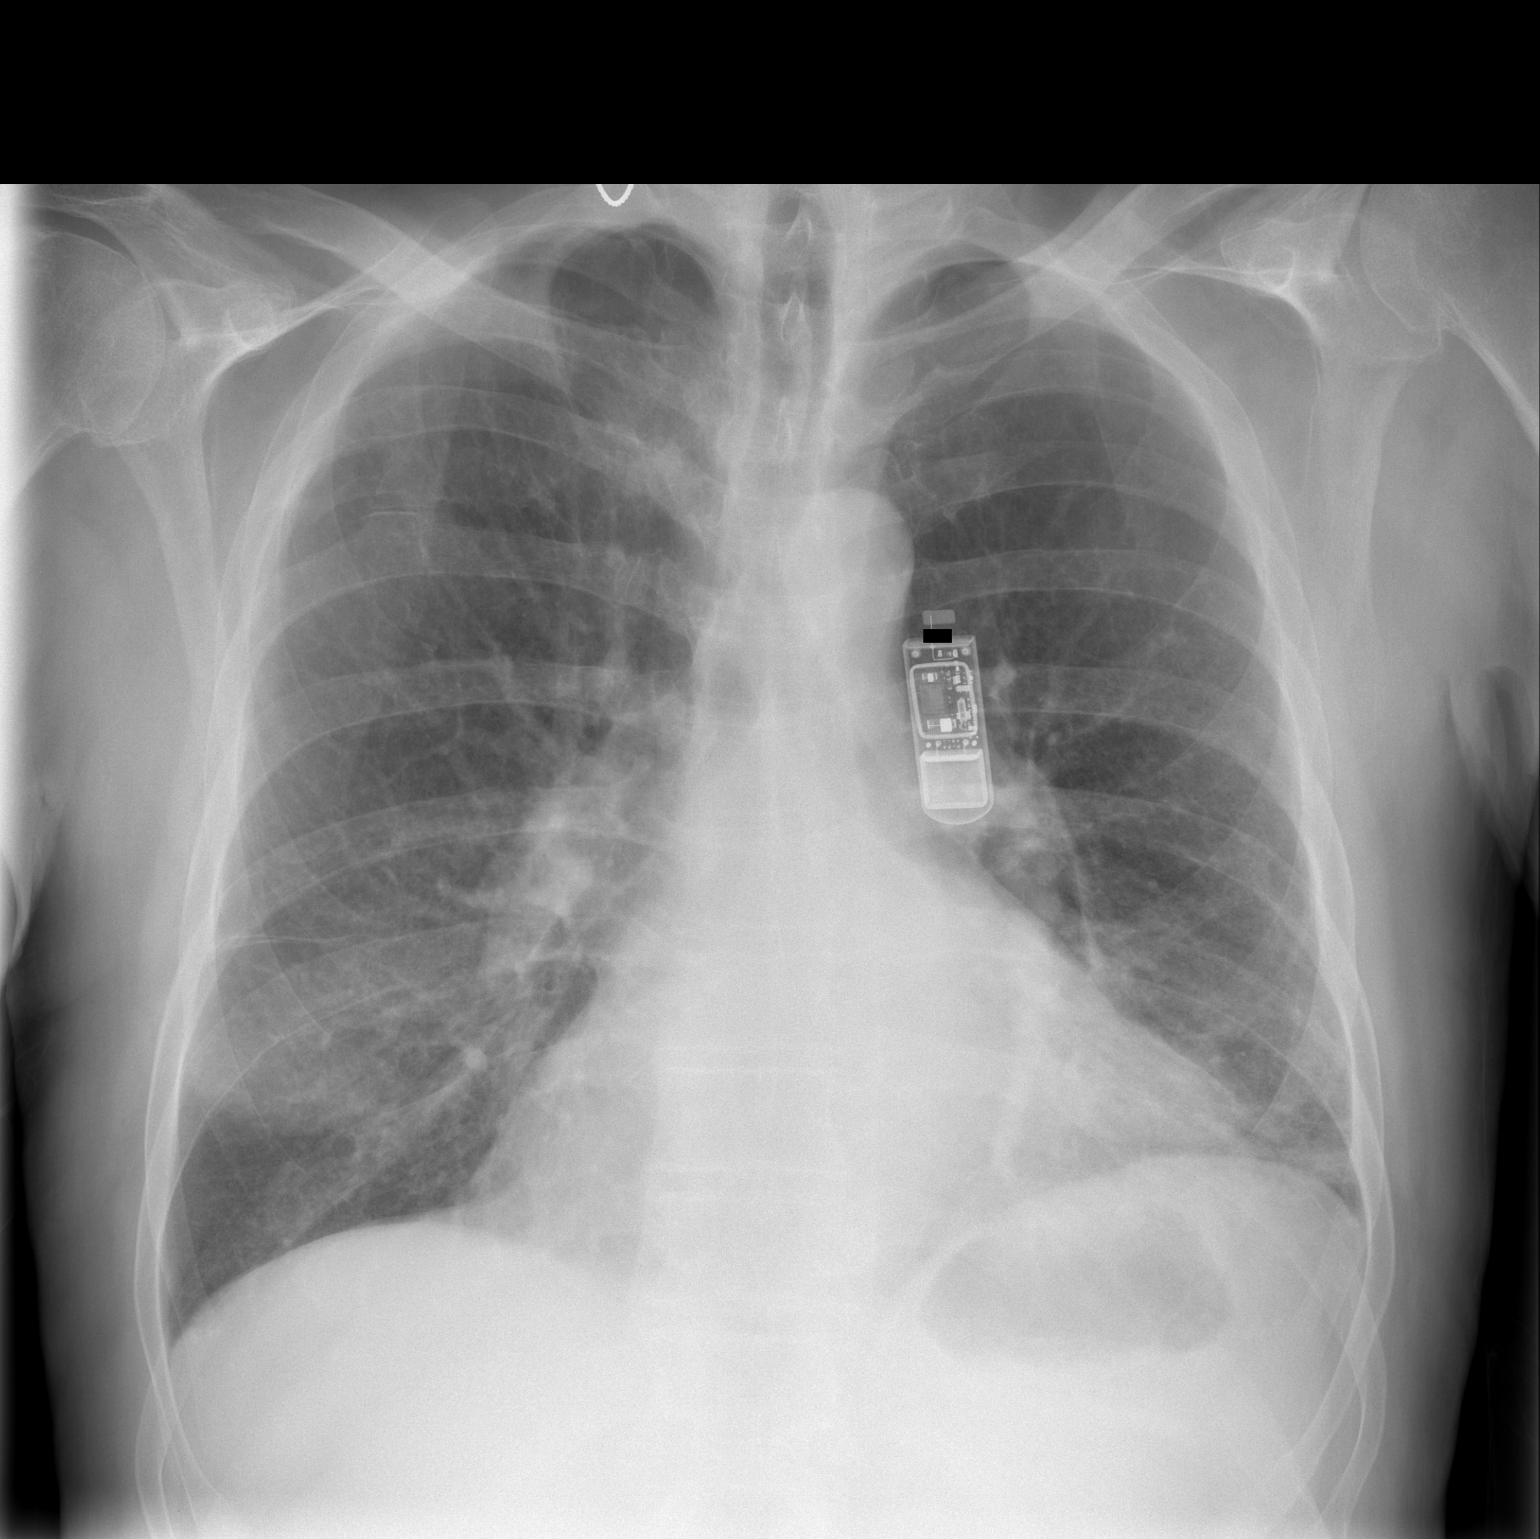

[w chest lat]
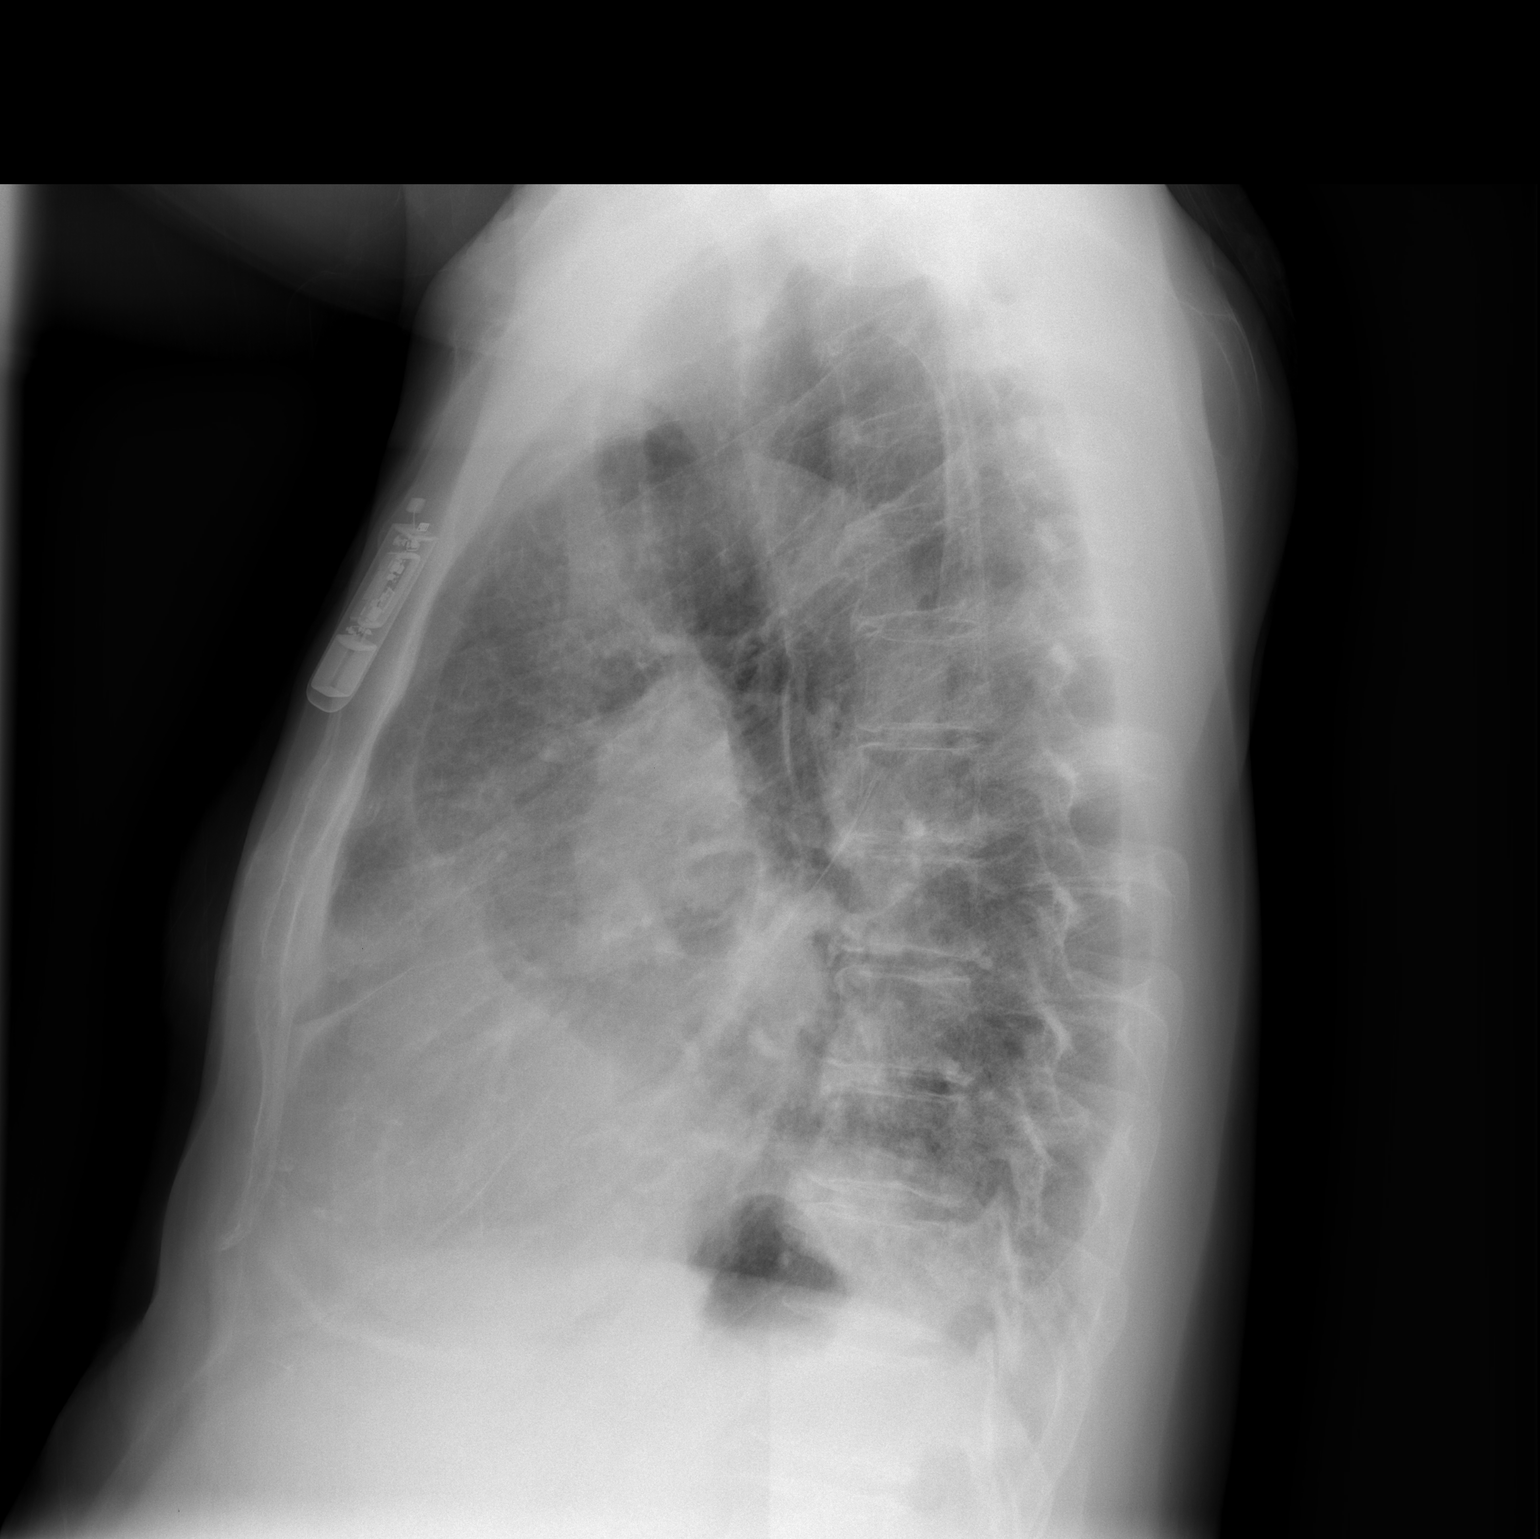

[2 of 2 positions shown; findings below may reference images not displayed]

FINDINGS: A neural stimulator is noted over the left hemithorax.
Cardiomegaly, no edema.  Left lower lobe atelectasis noted with
elevation of the left hemidiaphragm.  Right lung is grossly clear.
IMPRESSION: Left lower lobe atelectasis.

## 2009-06-11 ENCOUNTER — Ambulatory Visit: Payer: Self-pay | Admitting: Family Medicine

## 2009-06-13 IMAGING — CT CT HEAD W/O CM
1 series · 16 of 30 positions shown, 20 images · non-contrast
Comparison: 11/11/2007

CLINICAL DATA: Intracranial hemorrhage.  Confusion.  Headache.

CT HEAD WITHOUT CONTRAST
TECHNIQUE: Contiguous axial images were obtained from the base of
the skull through the vertex without contrast.

[Series 2: brain · axial · 0.47mm/px · z∈[+159,+293]mm · 16 of 32 slices shown, 20 images]
[im 2/32  brain]
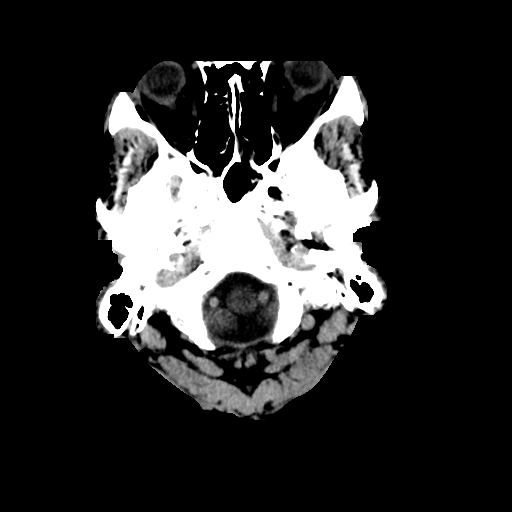
[im 2/32  bone]
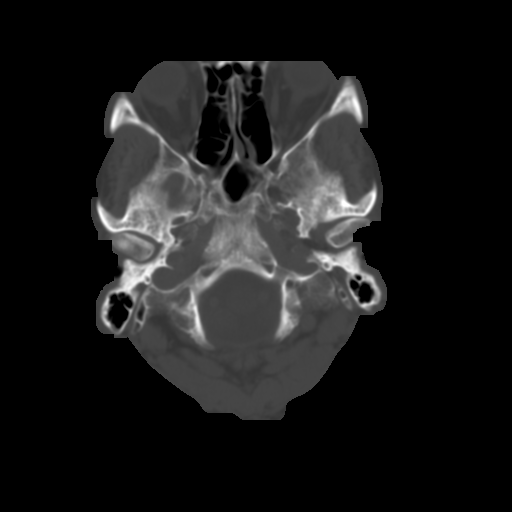
[im 4/32  brain]
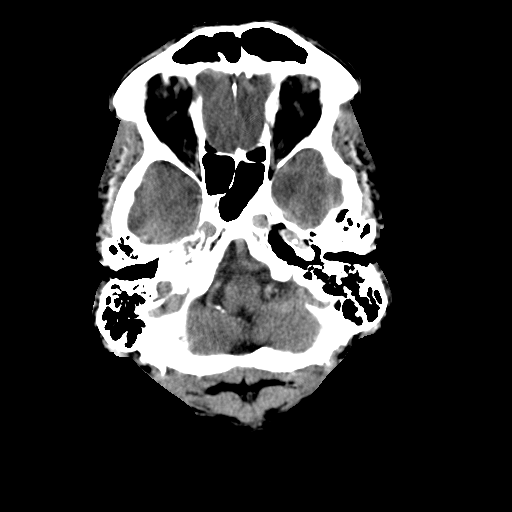
[im 6/32  brain]
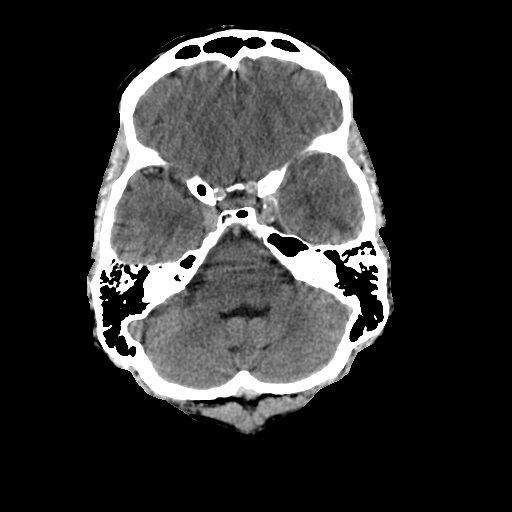
[im 8/32  brain]
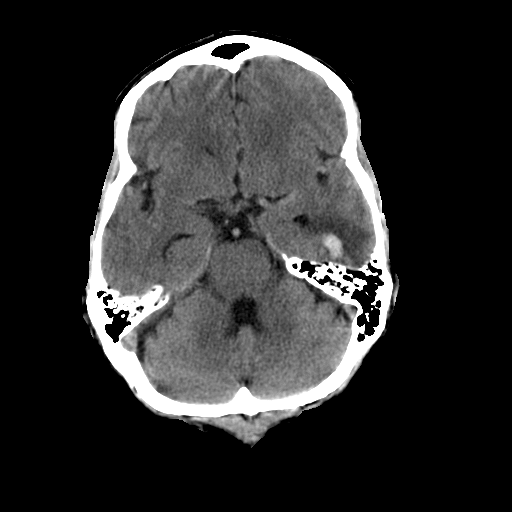
[im 9/32  brain]
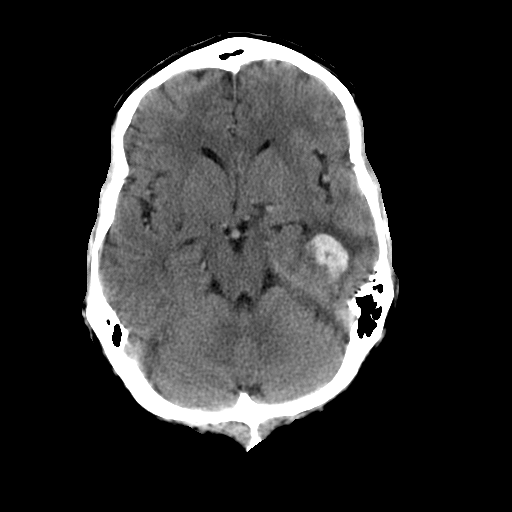
[im 9/32  bone]
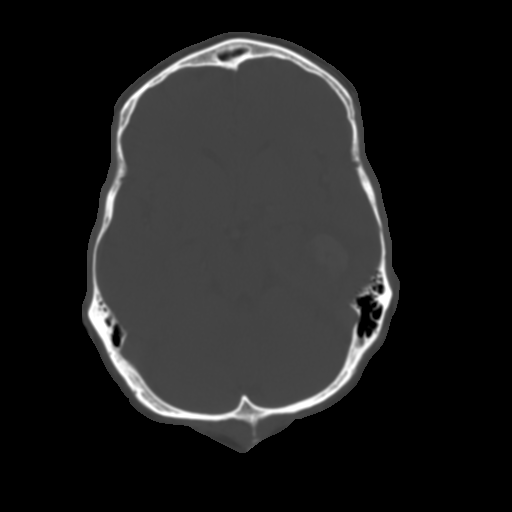
[im 11/32  brain]
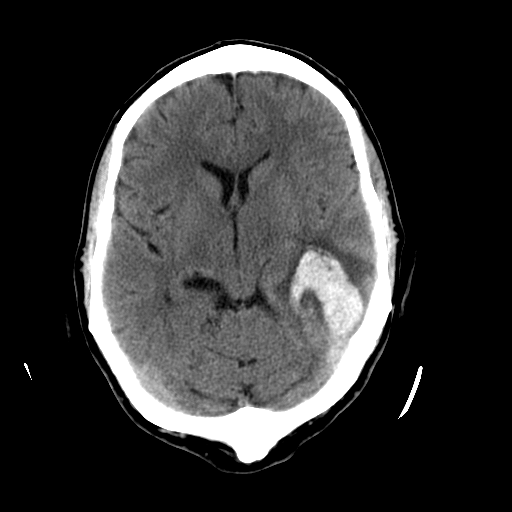
[im 13/32  brain]
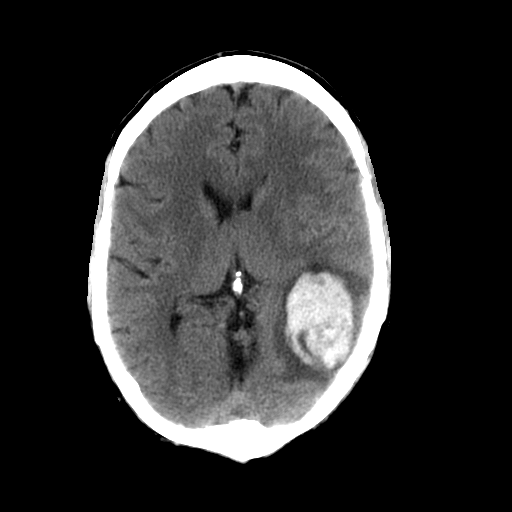
[im 15/32  brain]
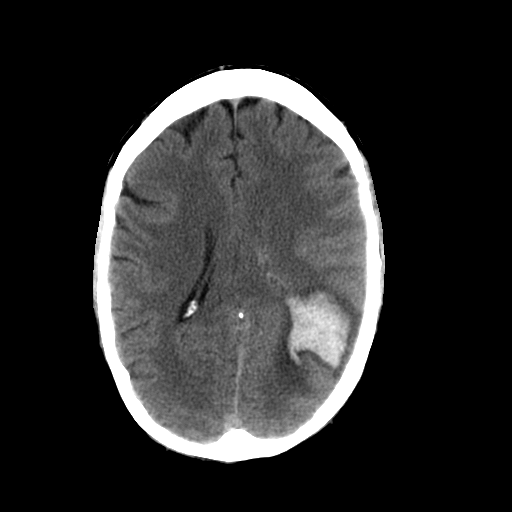
[im 17/32  brain]
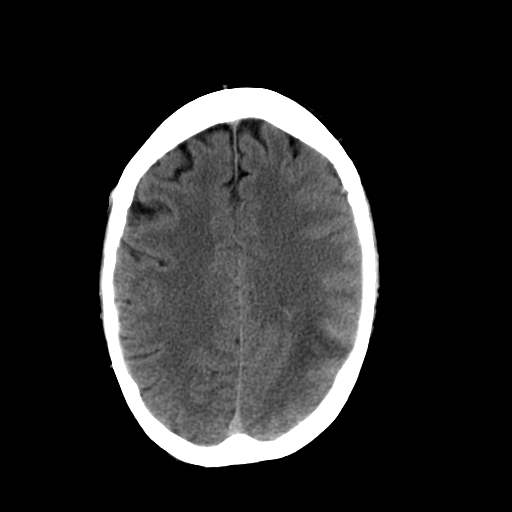
[im 17/32  bone]
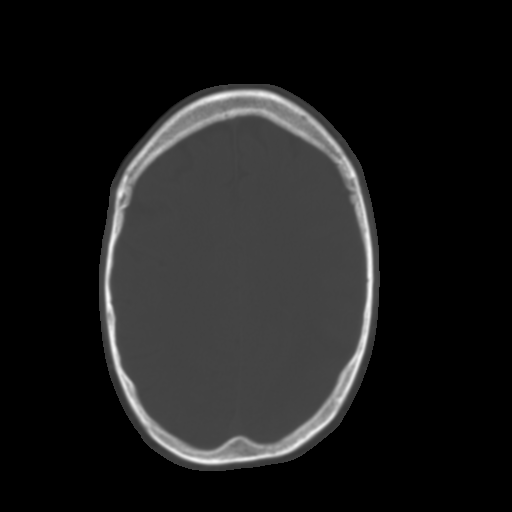
[im 19/32  brain]
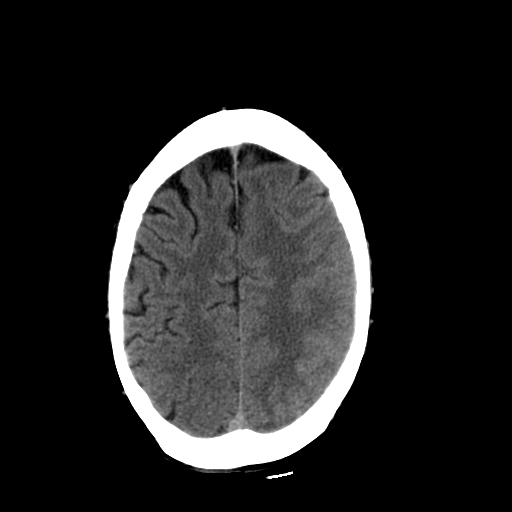
[im 21/32  brain]
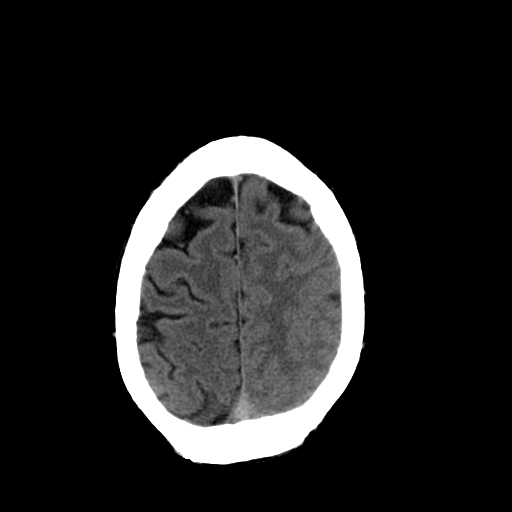
[im 23/32  brain]
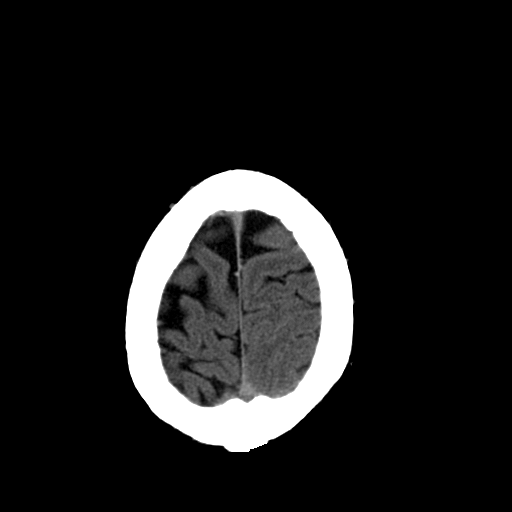
[im 24/32  brain]
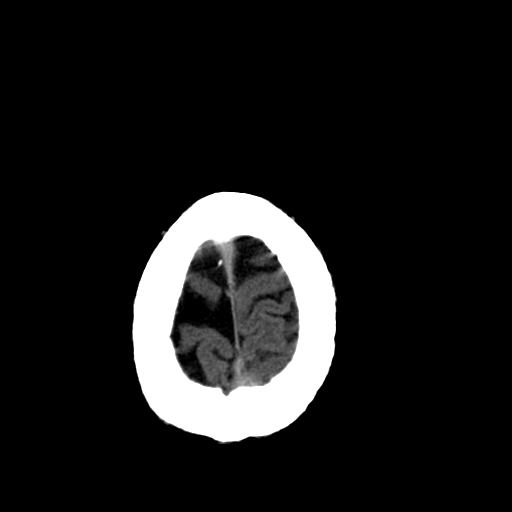
[im 24/32  bone]
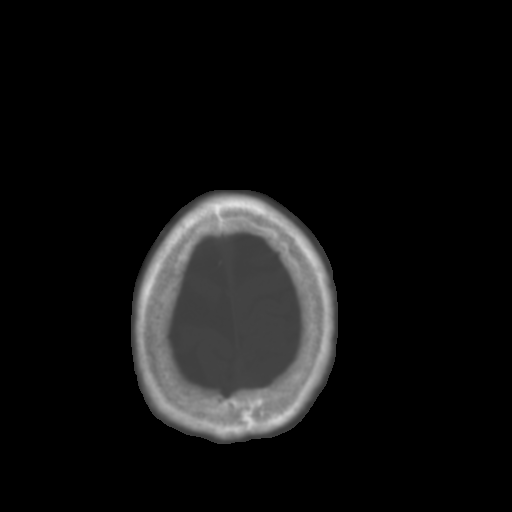
[im 26/32  brain]
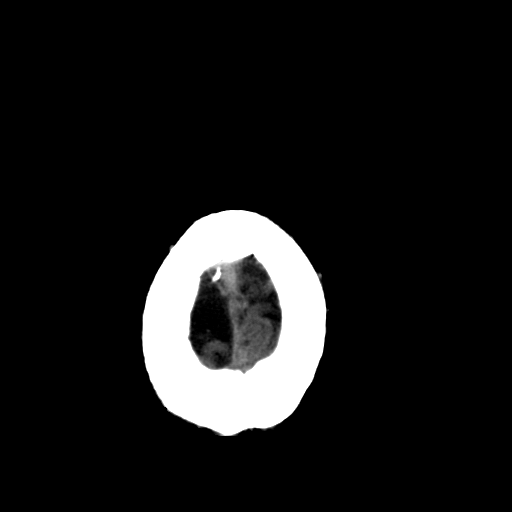
[im 28/32  brain]
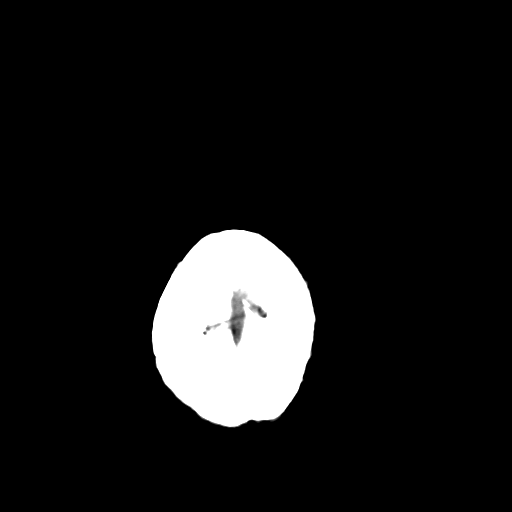
[im 30/32  brain]
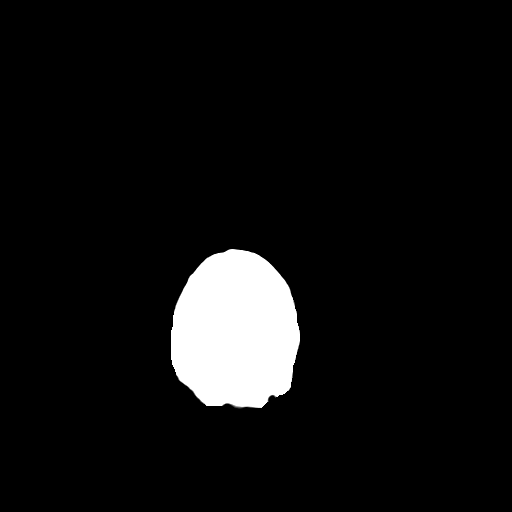

[16 of 30 positions shown; findings below may reference images not displayed]

FINDINGS: An intraparenchymal hematoma in the left temporal lobe
and temporoparietal region is no larger, measuring maximally 3.3 x
4.6 cm.  Surrounding vasogenic edema is very slightly increased.
There is left to right shift of 1 mm.  No sign of any new areas of
infarction or hemorrhage.  No extra-axial collection.
IMPRESSION: Stable examination with respect to the size of the intraparenchymal
hematoma on the left.  Perhaps minimal increase in vasogenic edema.

## 2009-07-06 ENCOUNTER — Ambulatory Visit (HOSPITAL_COMMUNITY): Admission: RE | Admit: 2009-07-06 | Discharge: 2009-07-06 | Payer: Self-pay | Admitting: Cardiology

## 2009-07-06 ENCOUNTER — Ambulatory Visit: Payer: Self-pay

## 2009-07-06 ENCOUNTER — Ambulatory Visit: Payer: Self-pay | Admitting: Cardiology

## 2009-07-06 ENCOUNTER — Encounter: Payer: Self-pay | Admitting: Cardiology

## 2009-07-15 ENCOUNTER — Encounter (INDEPENDENT_AMBULATORY_CARE_PROVIDER_SITE_OTHER): Payer: Self-pay | Admitting: *Deleted

## 2009-07-17 ENCOUNTER — Ambulatory Visit: Payer: Self-pay | Admitting: Cardiology

## 2009-07-28 ENCOUNTER — Ambulatory Visit: Payer: Self-pay | Admitting: Internal Medicine

## 2009-07-28 ENCOUNTER — Encounter: Payer: Self-pay | Admitting: Cardiology

## 2009-08-16 IMAGING — CT CT HEAD W/O CM
1 series · 16 of 30 positions shown, 20 images · non-contrast
Comparison: none

[Series 32: 3d filtered head · axial · 0.49mm/px · z∈[+17,+162]mm · 16 of 32 slices shown, 20 images]
[im 2/32  brain]
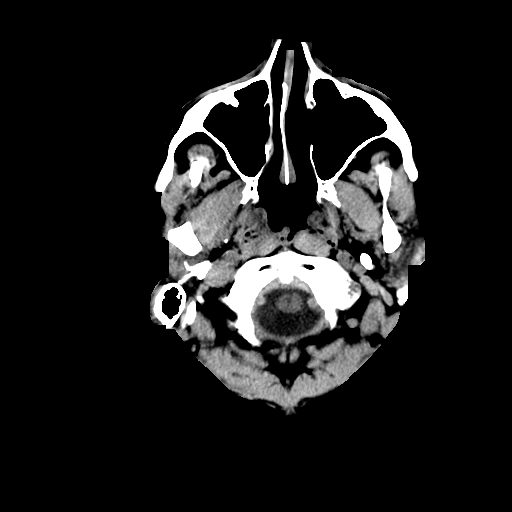
[im 2/32  bone]
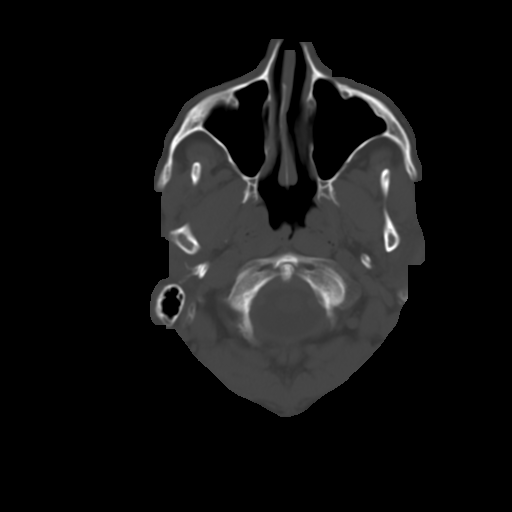
[im 4/32  brain]
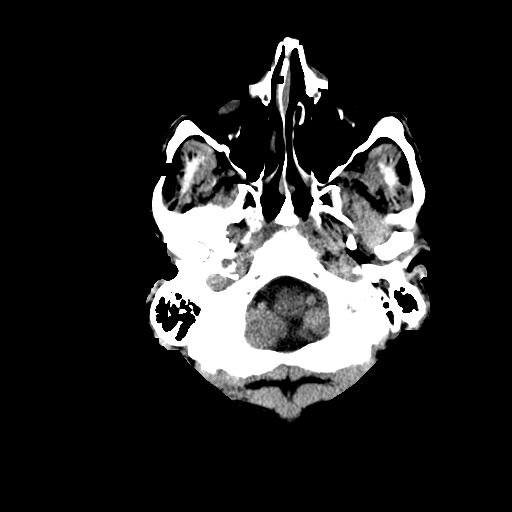
[im 6/32  brain]
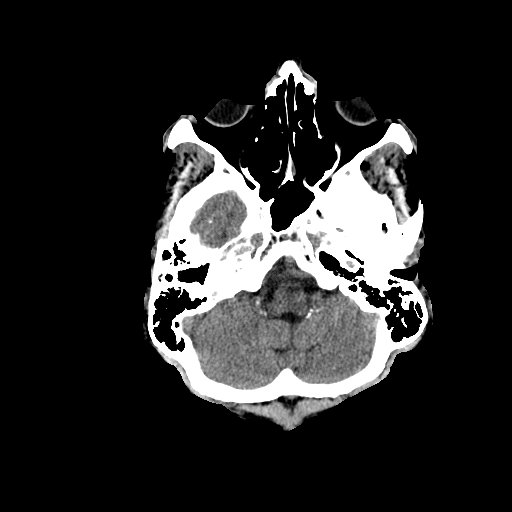
[im 8/32  brain]
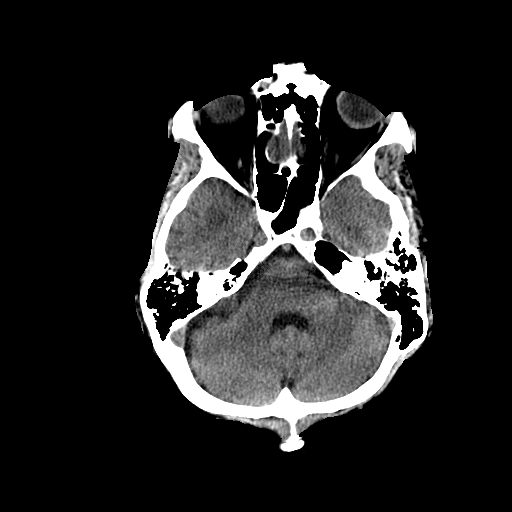
[im 9/32  brain]
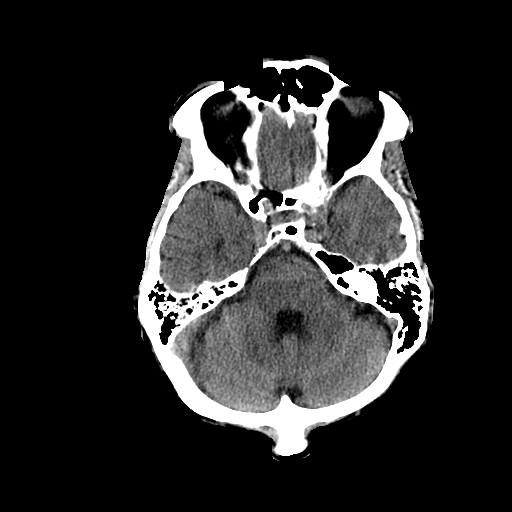
[im 9/32  bone]
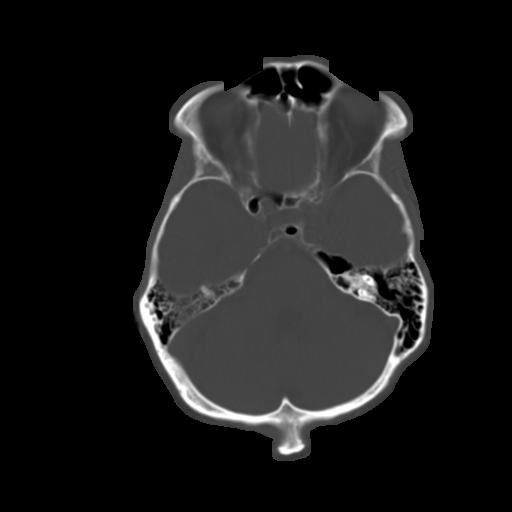
[im 11/32  brain]
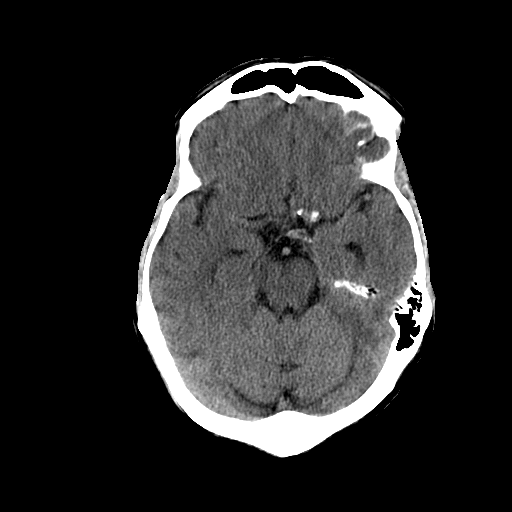
[im 13/32  brain]
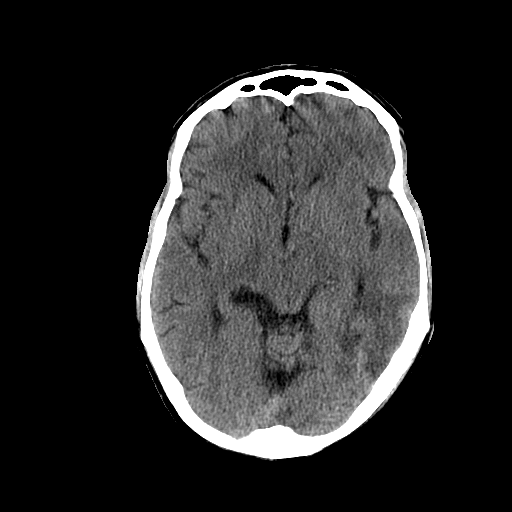
[im 15/32  brain]
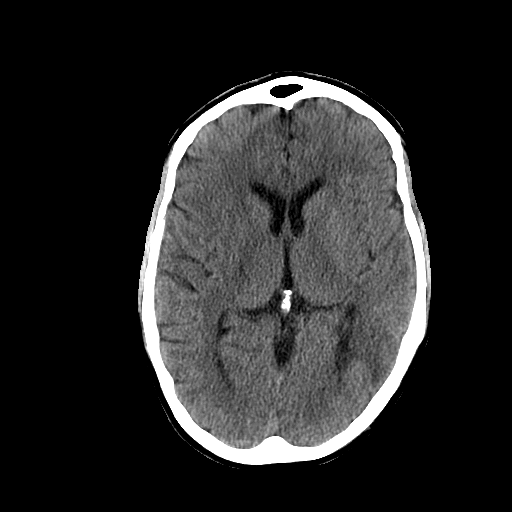
[im 17/32  brain]
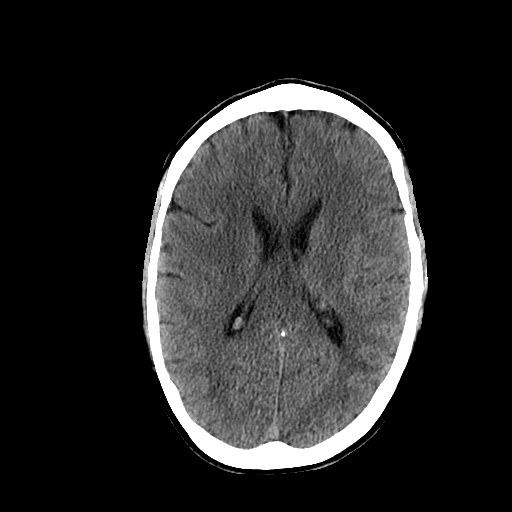
[im 17/32  bone]
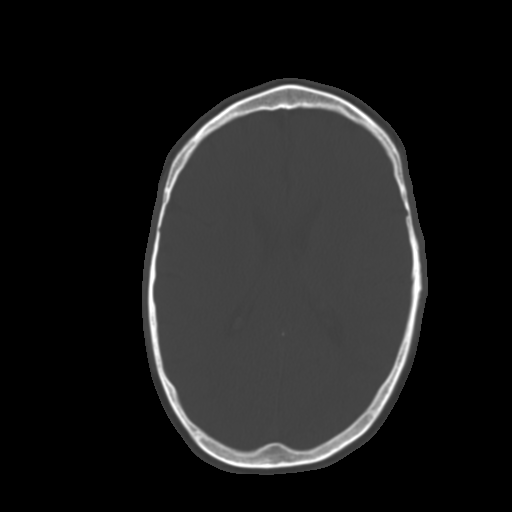
[im 19/32  brain]
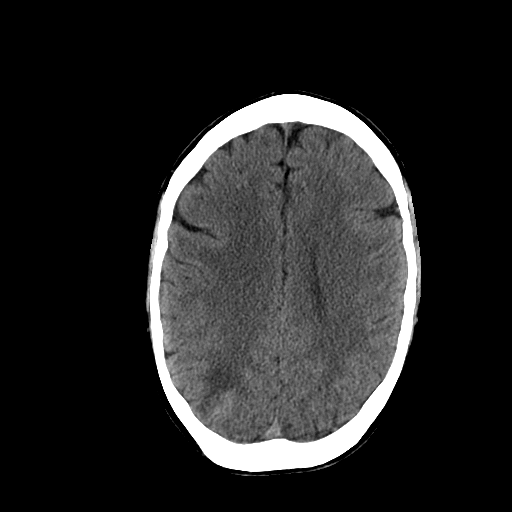
[im 21/32  brain]
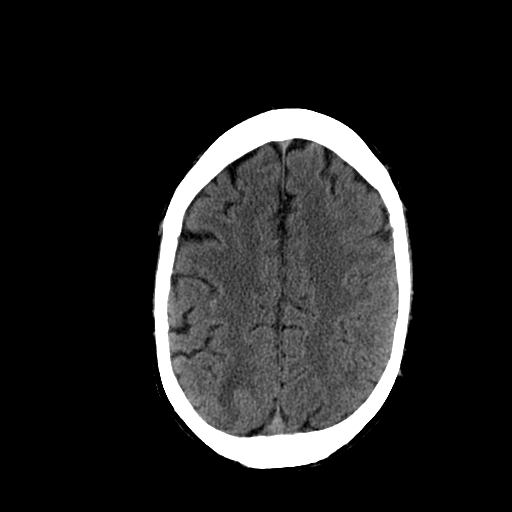
[im 23/32  brain]
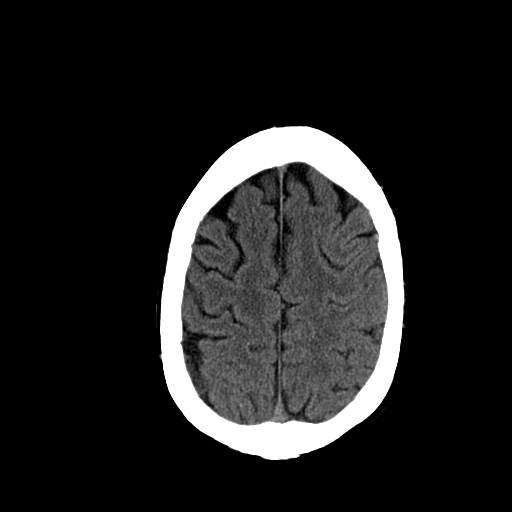
[im 24/32  brain]
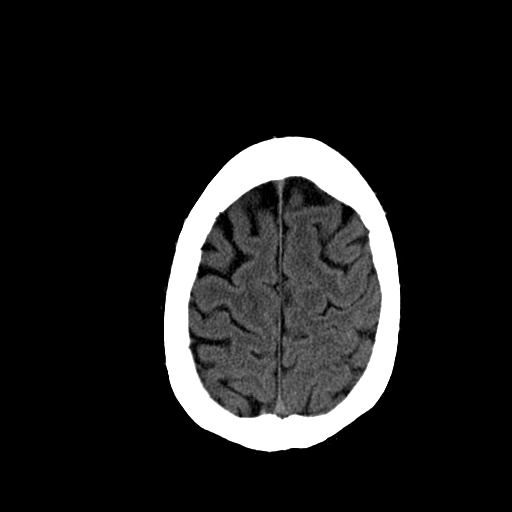
[im 24/32  bone]
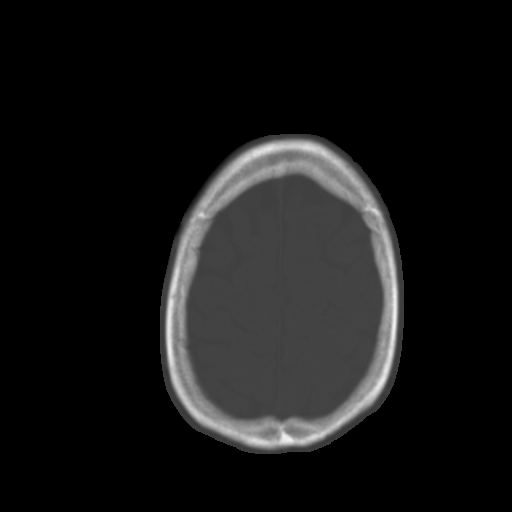
[im 26/32  brain]
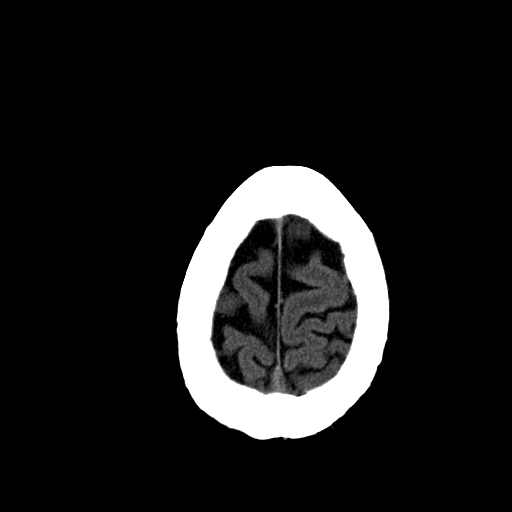
[im 28/32  brain]
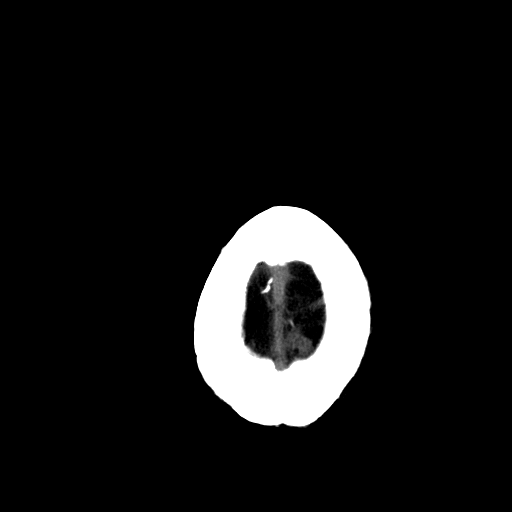
[im 30/32  brain]
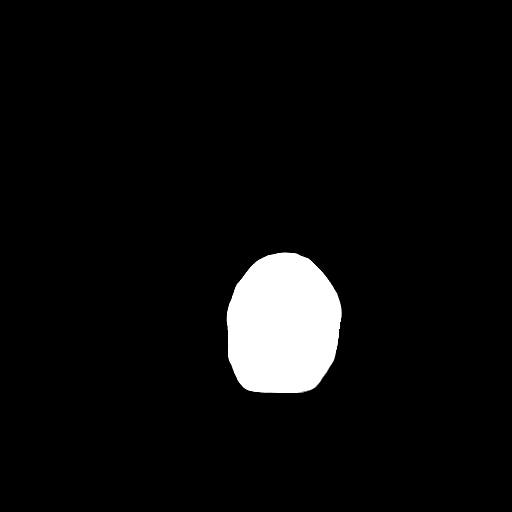

[16 of 30 positions shown; findings below may reference images not displayed]

This examination was performed at [HOSPITAL] at [HOSPITAL]
[HOSPITAL]. The interpretation will be provided by [REDACTED]

## 2009-09-15 ENCOUNTER — Ambulatory Visit: Payer: Self-pay | Admitting: Cardiology

## 2009-10-25 ENCOUNTER — Encounter: Payer: Self-pay | Admitting: Internal Medicine

## 2009-10-26 ENCOUNTER — Ambulatory Visit: Payer: Self-pay | Admitting: Internal Medicine

## 2009-11-06 ENCOUNTER — Encounter: Payer: Self-pay | Admitting: Internal Medicine

## 2009-11-24 IMAGING — CR DG CHEST 1V PORT
1 series · 1 of 1 positions shown · non-contrast
Comparison: 11/10/2007

CLINICAL DATA: Chest pain.

PORTABLE CHEST - 1 VIEW

[AP]
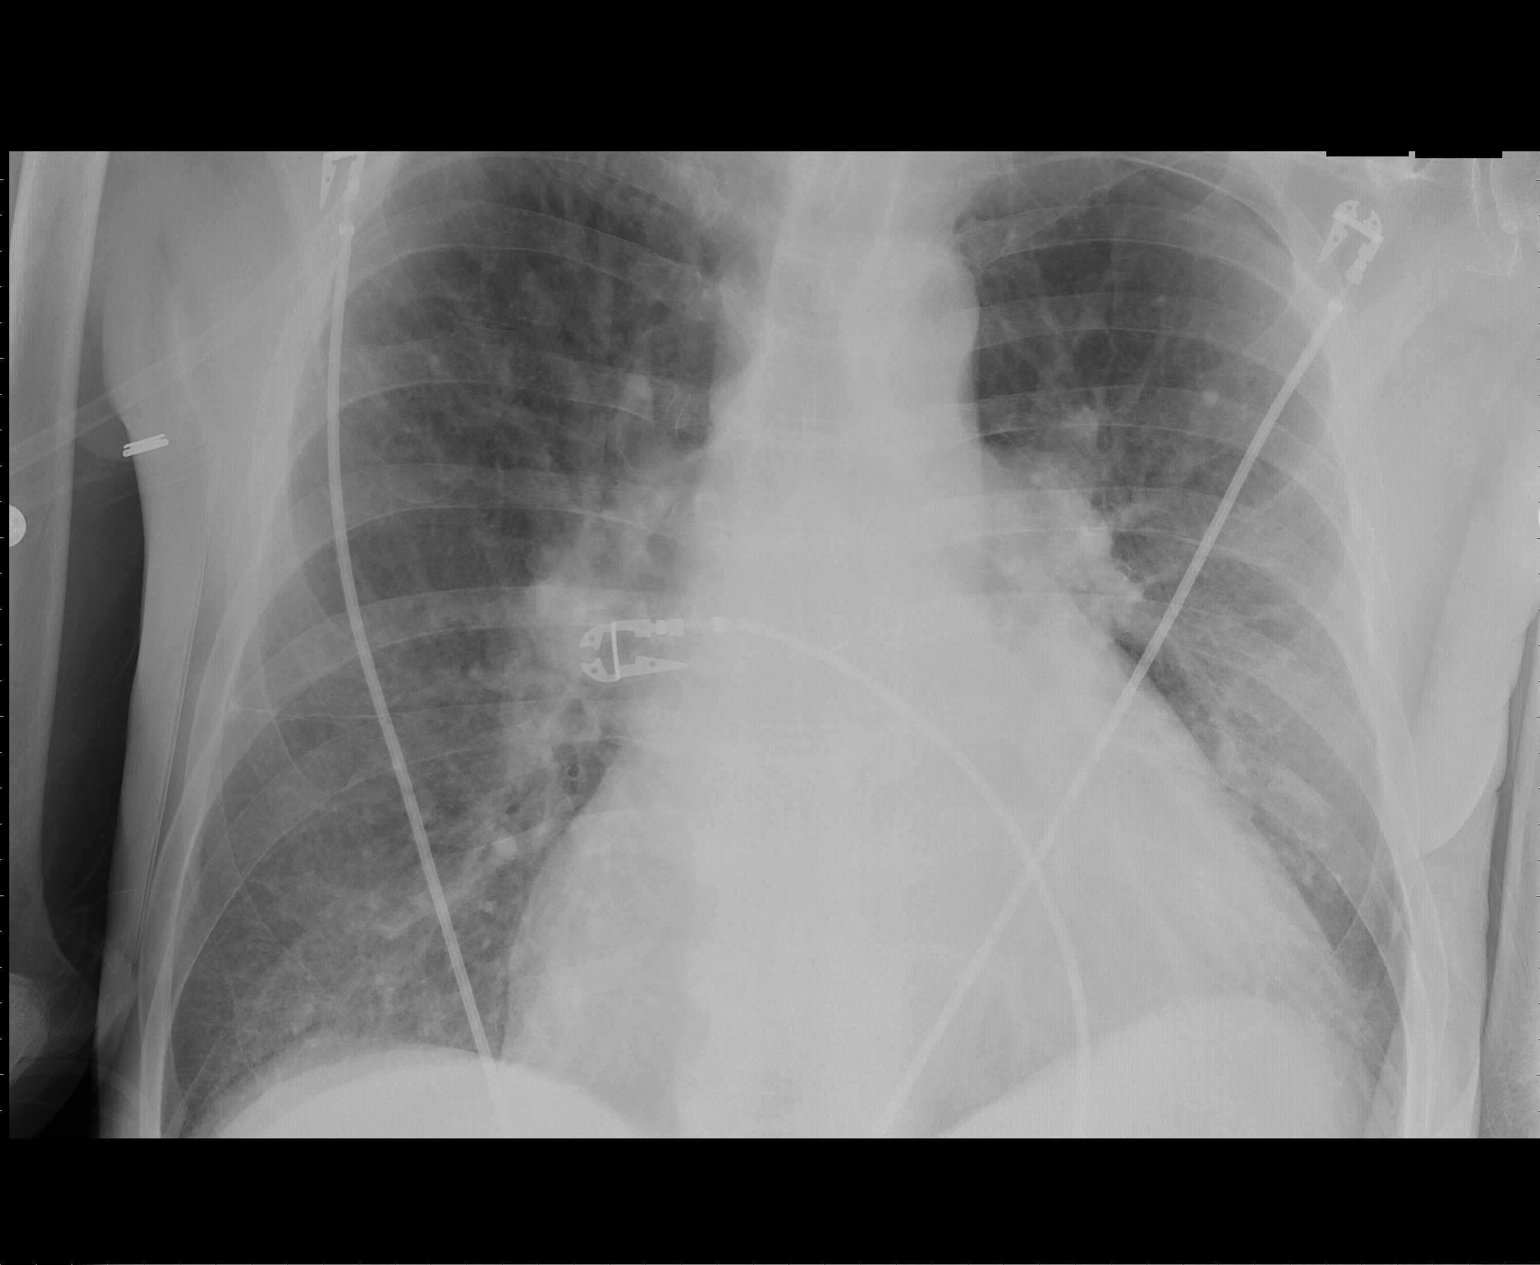

[1 of 1 positions shown; findings below may reference images not displayed]

FINDINGS: Prominent cardiomegaly is once again noted.

Pulmonary venous hypertension is present with faint Kerley B lines.
The appearance is compatible with mild congestive heart failure.
IMPRESSION: 1.  Prominent cardiomegaly with mild interstitial edema compatible
with mild congestive heart failure.

## 2009-11-29 IMAGING — CT CT HEAD W/O CM
1 of 2 series · 13 of 30 positions shown, 17 images · non-contrast
Comparison: 01/16/2008

CLINICAL DATA: Stroke.  Seizure.

CT HEAD WITHOUT CONTRAST
TECHNIQUE: Contiguous axial images were obtained from the base of
the skull through the vertex without contrast.

[Series 2: brain · axial · 0.49mm/px · z∈[+165,+295]mm · 13 of 32 slices shown, 17 images]
[im 3/32  brain]
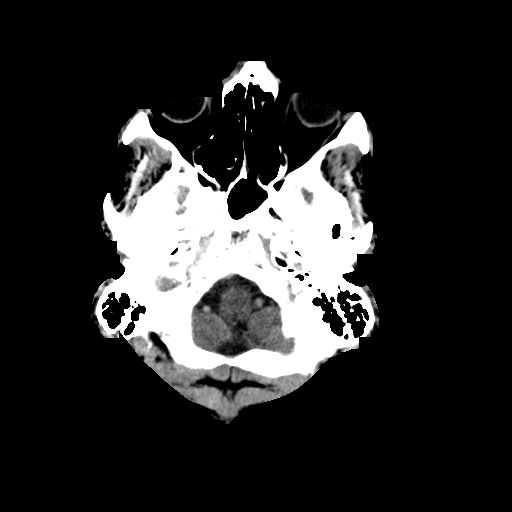
[im 3/32  bone]
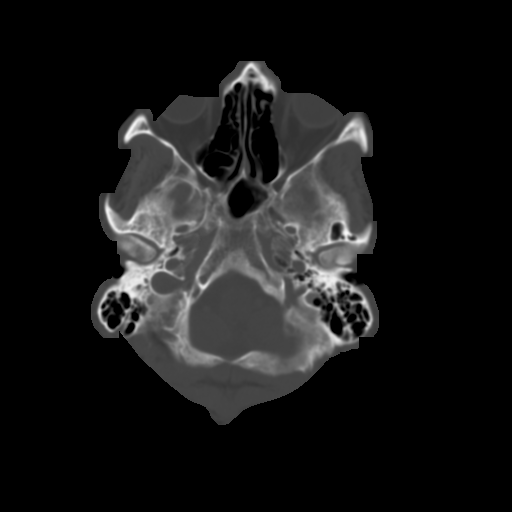
[im 5/32  brain]
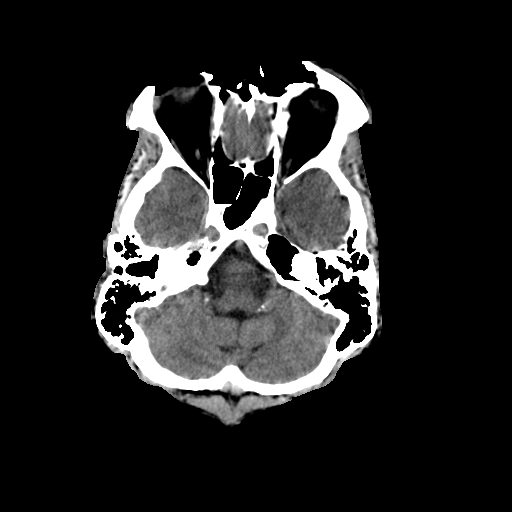
[im 7/32  brain]
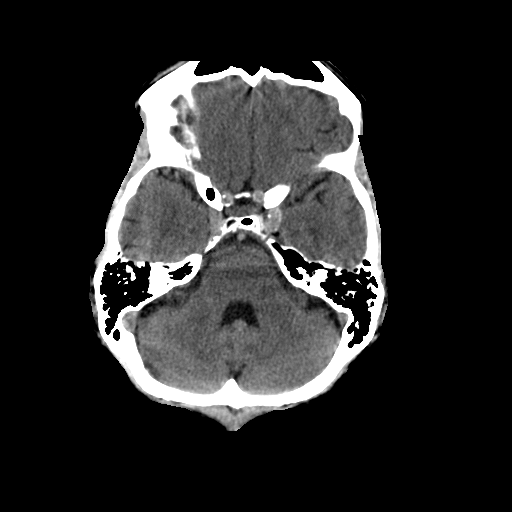
[im 9/32  brain]
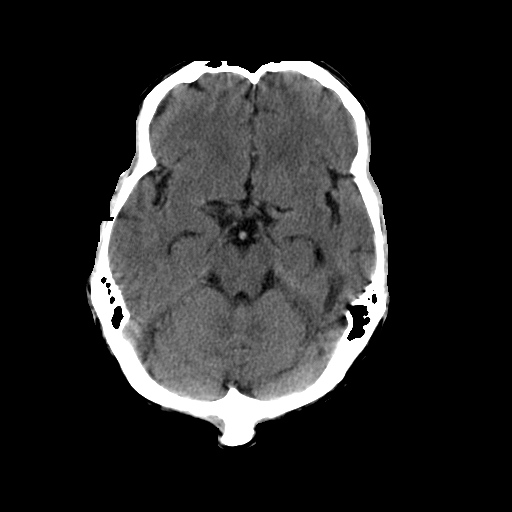
[im 12/32  brain]
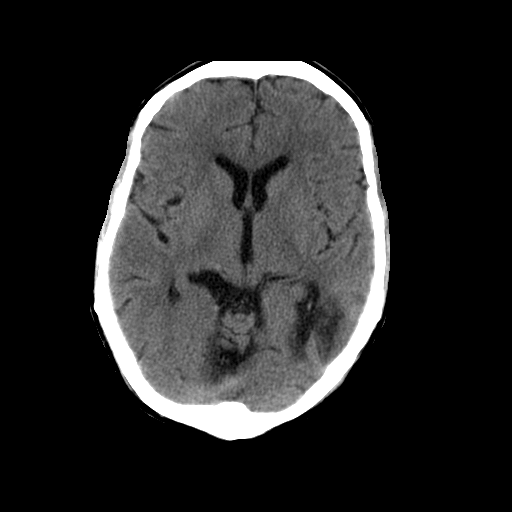
[im 12/32  bone]
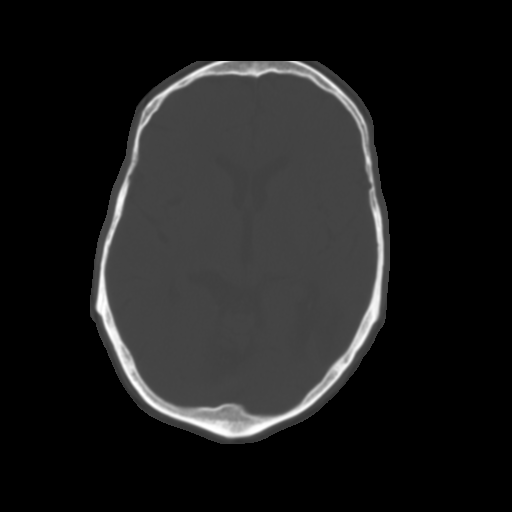
[im 14/32  brain]
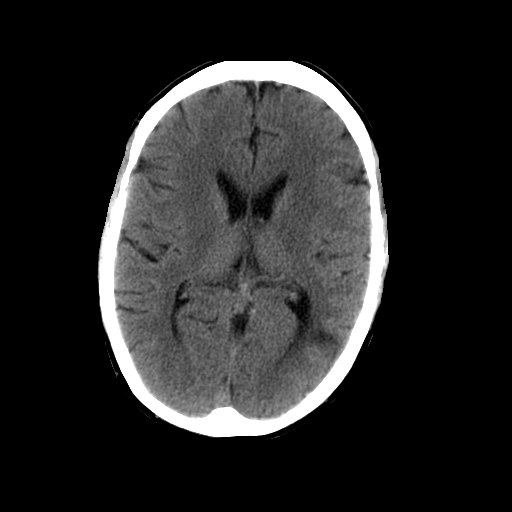
[im 16/32  brain]
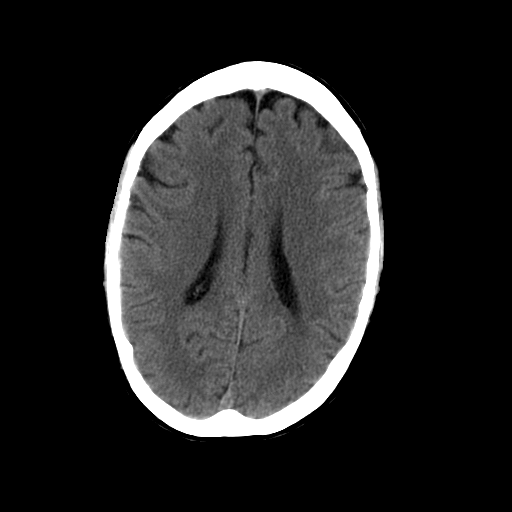
[im 18/32  brain]
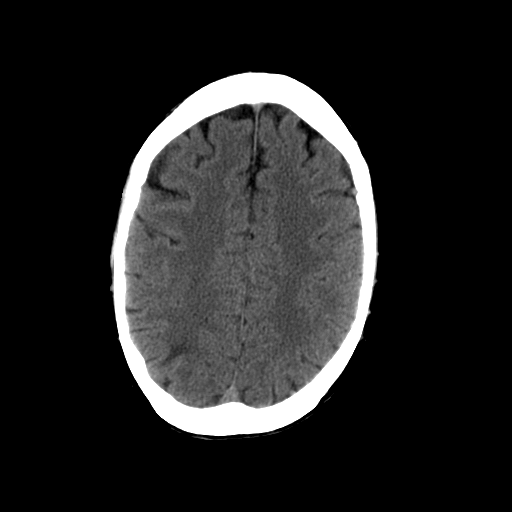
[im 20/32  brain]
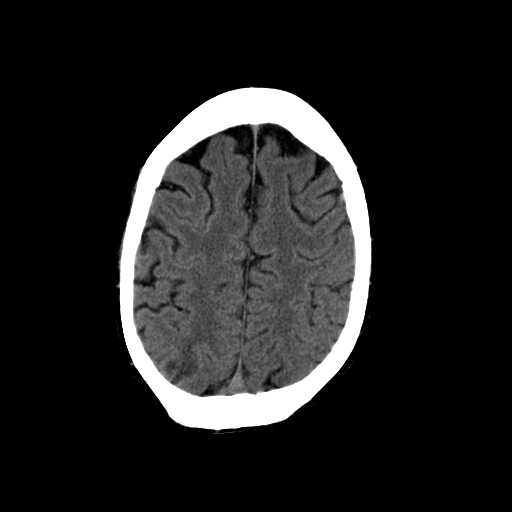
[im 20/32  bone]
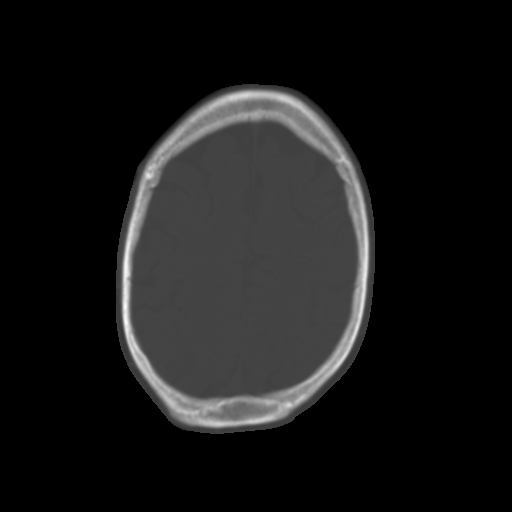
[im 23/32  brain]
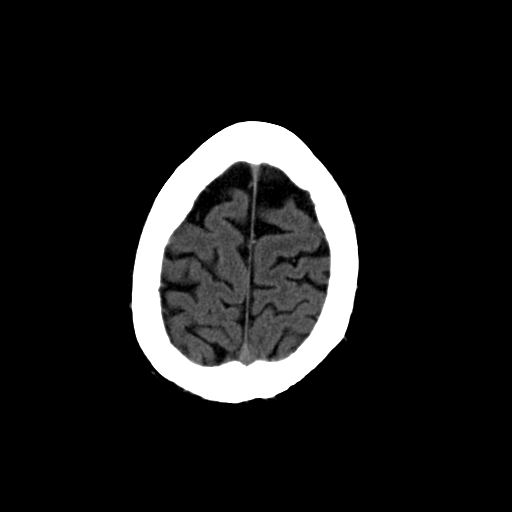
[im 25/32  brain]
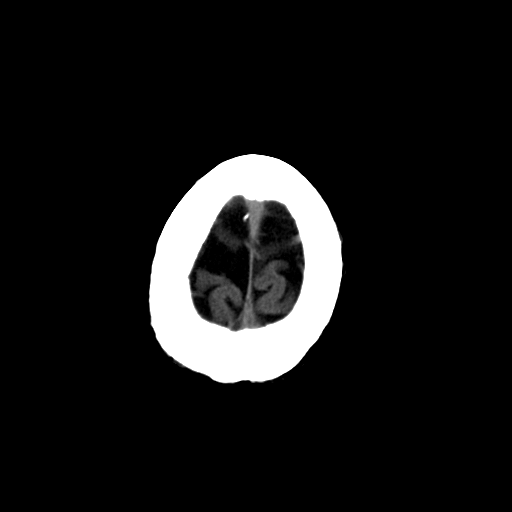
[im 27/32  brain]
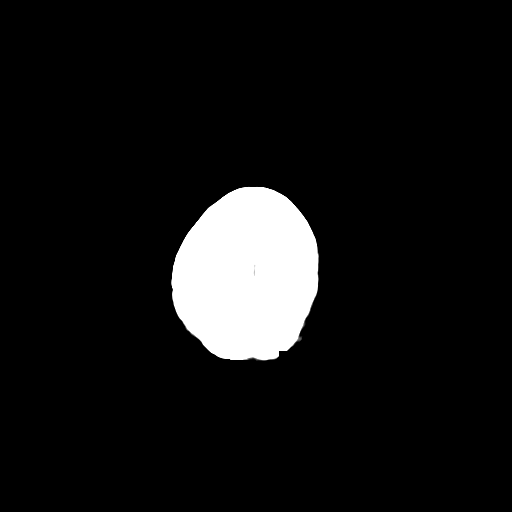
[im 29/32  brain]
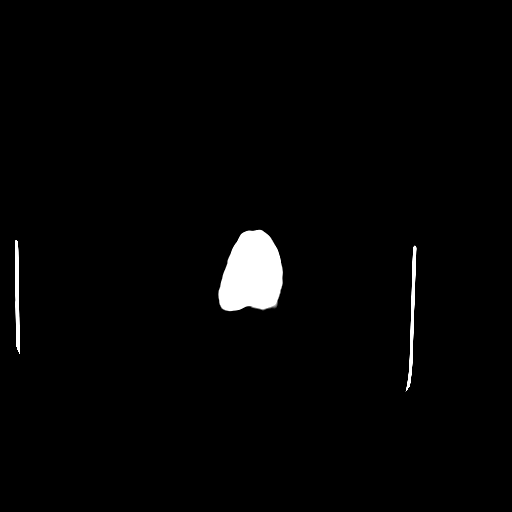
[im 29/32  bone]
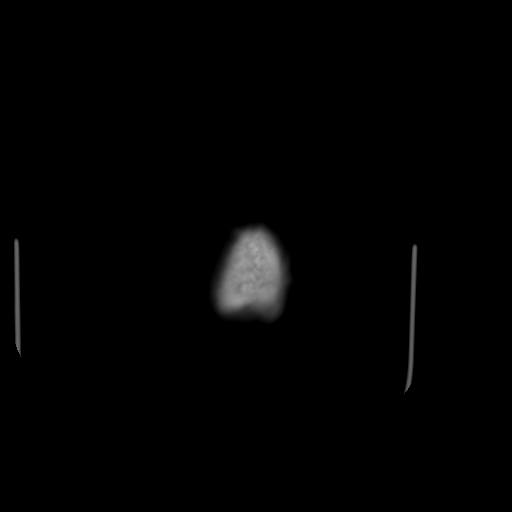

[13 of 30 positions shown; findings below may reference images not displayed]

FINDINGS: There are old infarctions in the right parietal lobe and
left temporo-parietal region which have progressed atrophy and
encephalomalacia.  No sign of acute infarction, mass lesion,
hemorrhage, hydrocephalus or extra-axial collection.  The calvarium
is unremarkable.  The sinuses, middle ears and mastoids are clear.
IMPRESSION: No evidence of acute pathology.  Old infarctions in the right
parietal and left temporo-parietal regions.

## 2009-12-01 ENCOUNTER — Telehealth (INDEPENDENT_AMBULATORY_CARE_PROVIDER_SITE_OTHER): Payer: Self-pay | Admitting: *Deleted

## 2009-12-06 IMAGING — CR DG CHEST 1V PORT
1 series · 1 of 1 positions shown · non-contrast
Comparison: Portable chest 04/25/2008.

CLINICAL DATA: Chest pain.  Status post pacemaker placement.

PORTABLE CHEST - 1 VIEW

[AP]
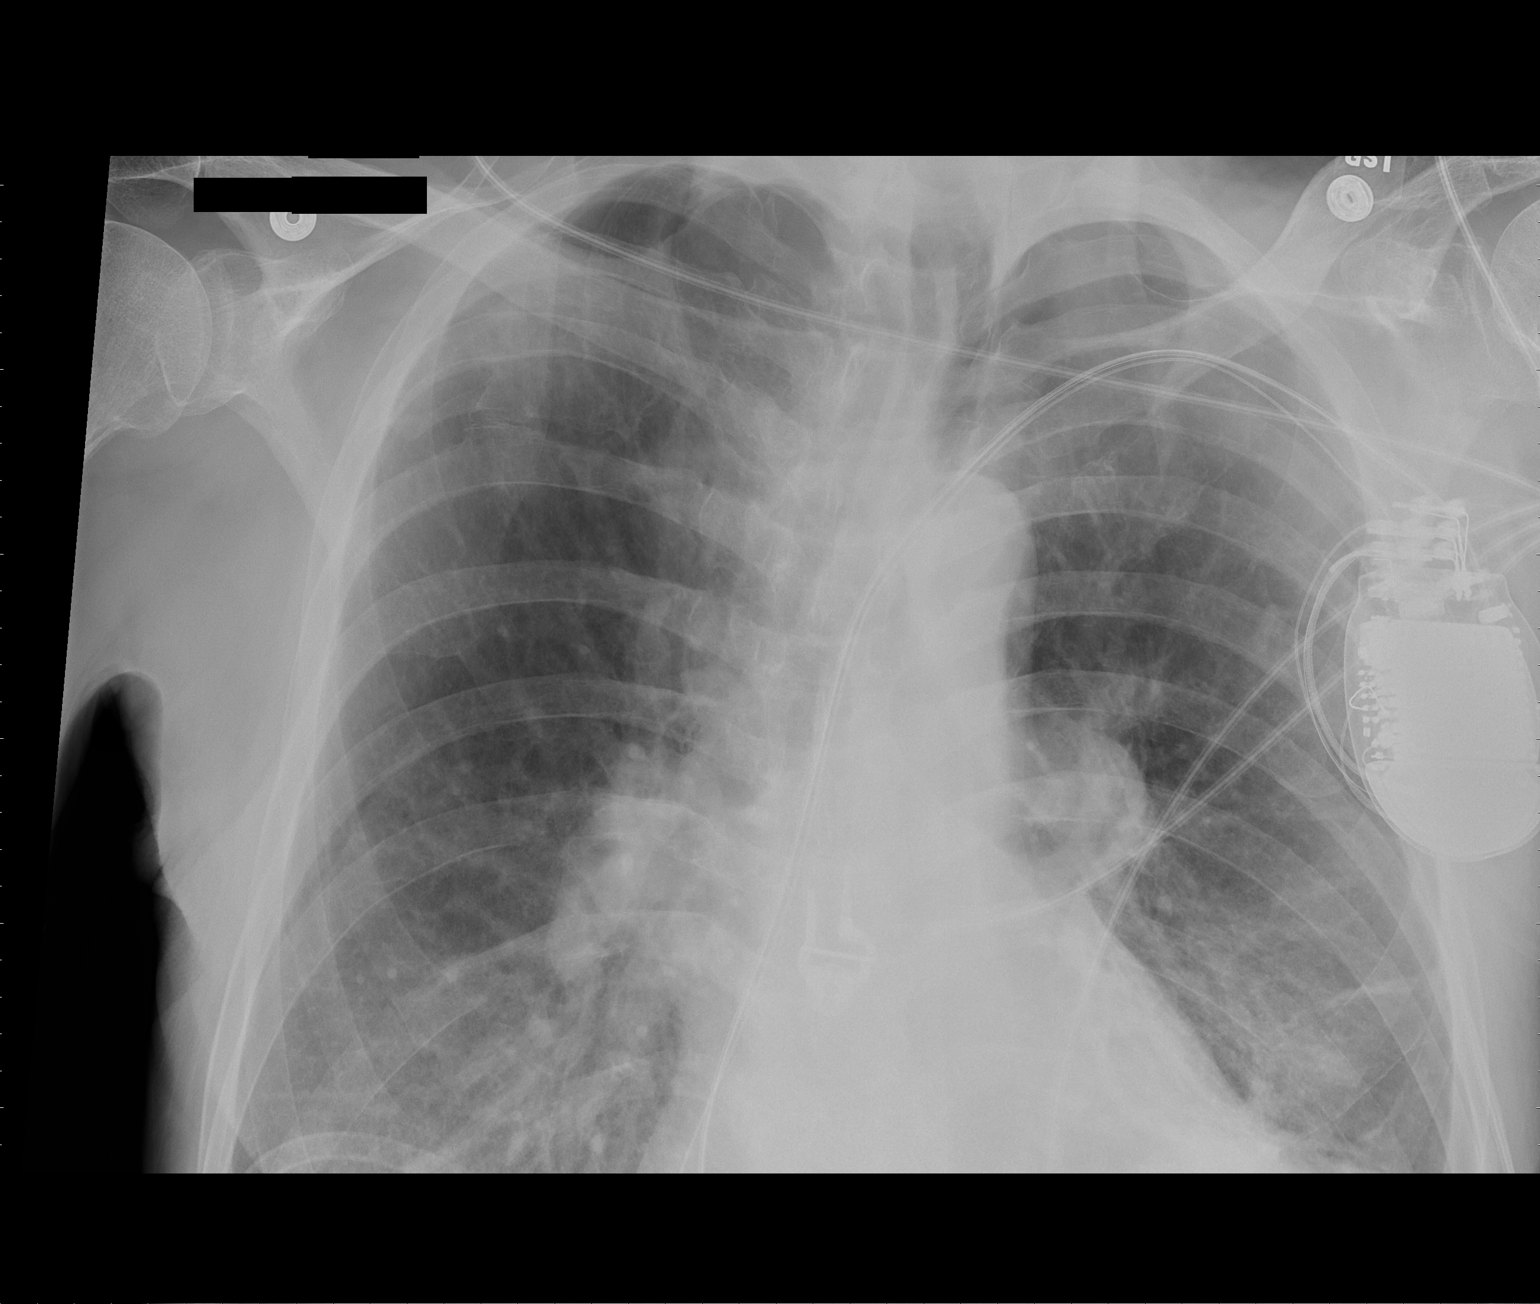

[1 of 1 positions shown; findings below may reference images not displayed]

FINDINGS: The patient has a new dual lead pacing device with both
leads projecting over the right ventricle.  There is no
pneumothorax.  Aeration is improved since the comparison study with
mild vascular congestion noted.  Cardiomegaly.
IMPRESSION: 1.  Pacing device in place.  Exact position of leads to be assessed
with PA and lateral films.

## 2010-01-06 IMAGING — CR DG CHEST 1V PORT
1 series · 1 of 1 positions shown · non-contrast
Comparison: Portable chest 05/07/2008.

CLINICAL DATA: Fall.

PORTABLE CHEST - 1 VIEW

[AP]
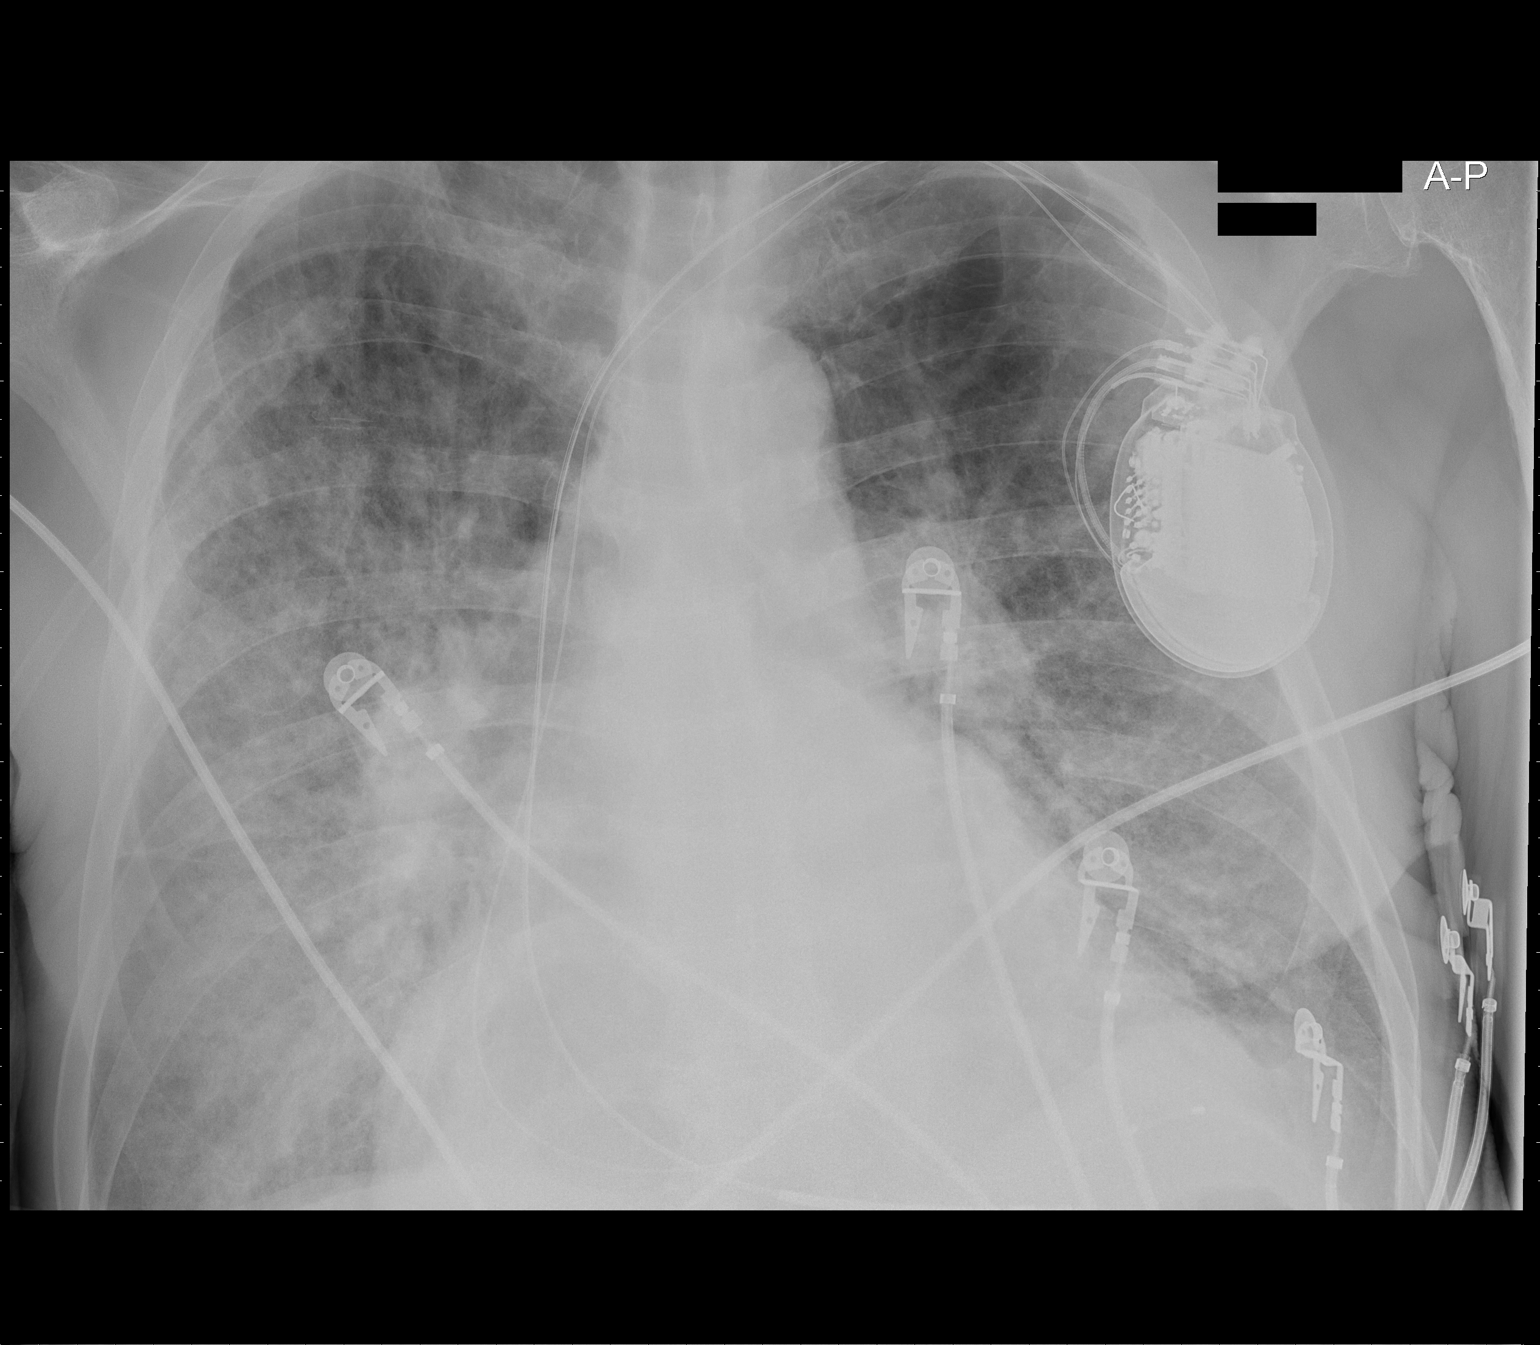

[1 of 1 positions shown; findings below may reference images not displayed]

FINDINGS: There is marked cardiomegaly.  Bilateral alveolar and
interstitial opacities are most consistent with pulmonary edema.
No effusion.
IMPRESSION: Cardiomegaly and pulmonary edema.

## 2010-01-06 IMAGING — CT CT HEAD W/O CM
1 of 2 series · 12 of 30 positions shown, 15 images · IV contrast (agent unspecified)
Comparison: 04/30/2008 and earlier.
COMPARISON: None.

CT ABDOMEN

Addendum Begins

On review, the head CT portion of this exam is remarkable for new
hypodensity and loss of gray-white matter differentiation in the
right occipital lobe suggestive of an acute or subacute right PCA
territory infarct.  No mass effect or hemorrhagic conversion is
evident on these images.  This finding is new since 04/30/2008.
This case was reviewed with Dr. Heia on 06/25/2008 at 7297 hours.
Addendum Ends
CLINICAL DATA: 62-year-old male status post fall.  The patient is
on Coumadin status post defibrillator placement 3 weeks ago.
Abdominal pain.
CT HEAD WITHOUT CONTRAST
TECHNIQUE: Contiguous axial images were obtained from the base of
the skull through the vertex without intravenous contrast.
TECHNIQUE: Multidetector CT imaging of the abdomen and pelvis was
performed following the standard protocol during bolus
administration of intravenous contrast.
Contrast: 80 ml Xmnipaque-GHH.

[Series 2: head routine 4.8 h37s · axial · 0.45mm/px · z∈[+1133,+1255]mm · 12 of 30 slices shown, 15 images]
[im 3/30  brain]
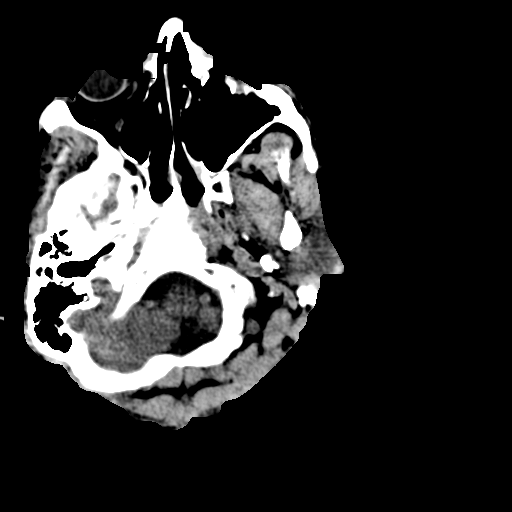
[im 3/30  bone]
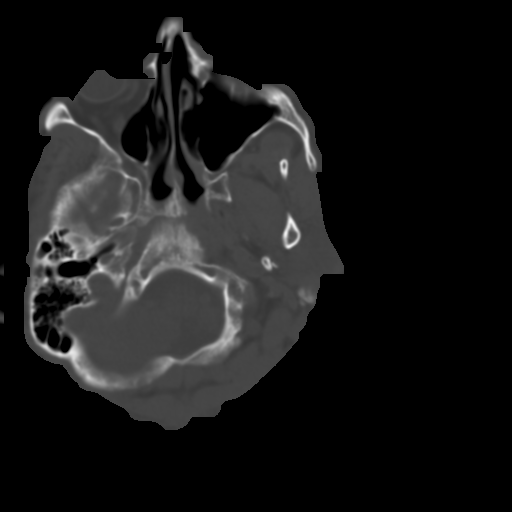
[im 5/30  brain]
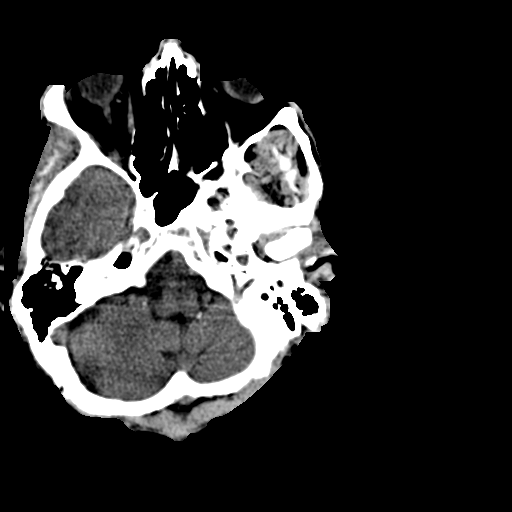
[im 7/30  brain]
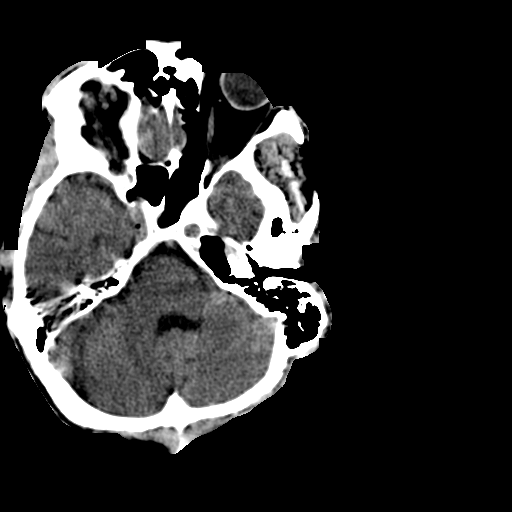
[im 9/30  brain]
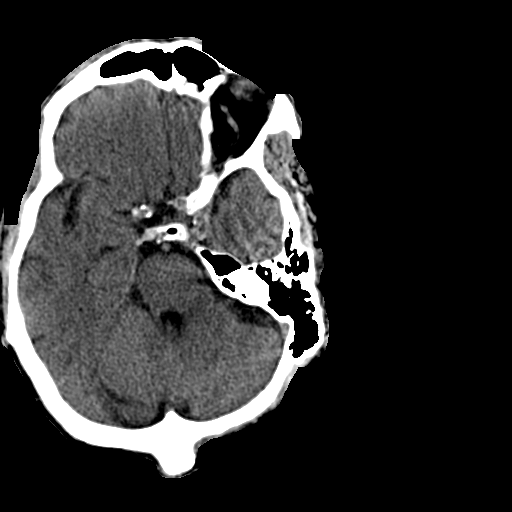
[im 11/30  brain]
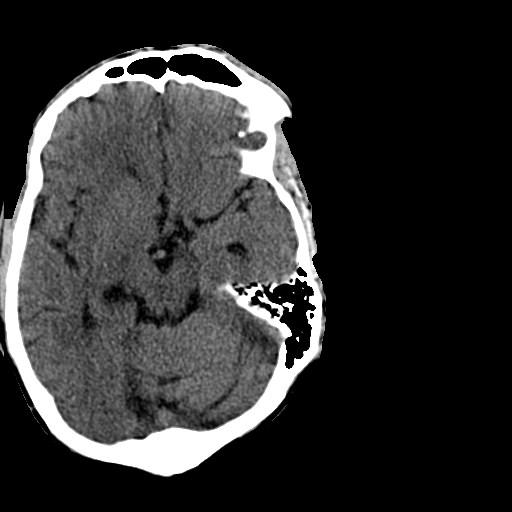
[im 11/30  bone]
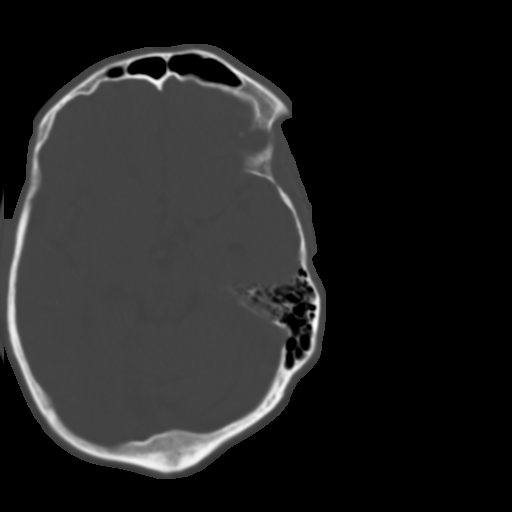
[im 13/30  brain]
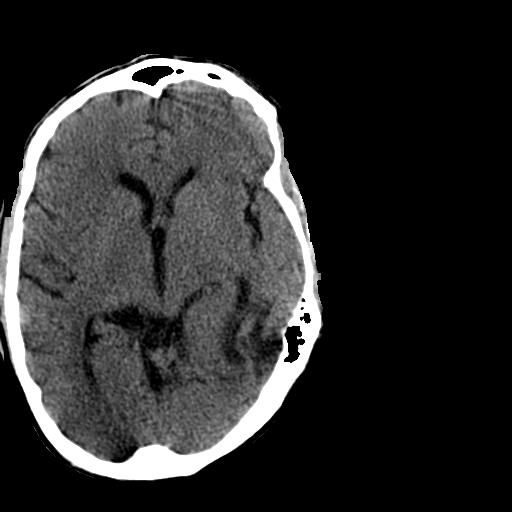
[im 17/30  brain]
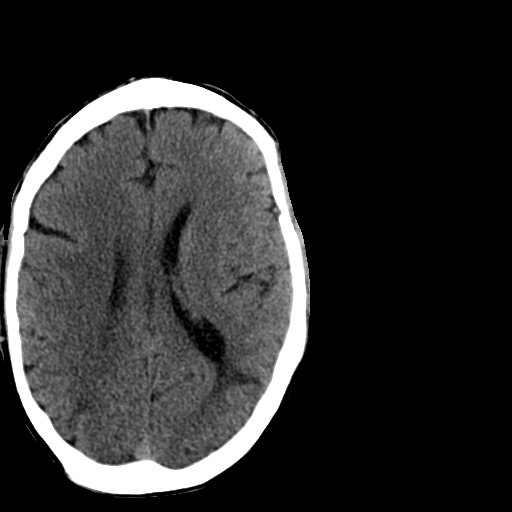
[im 19/30  brain]
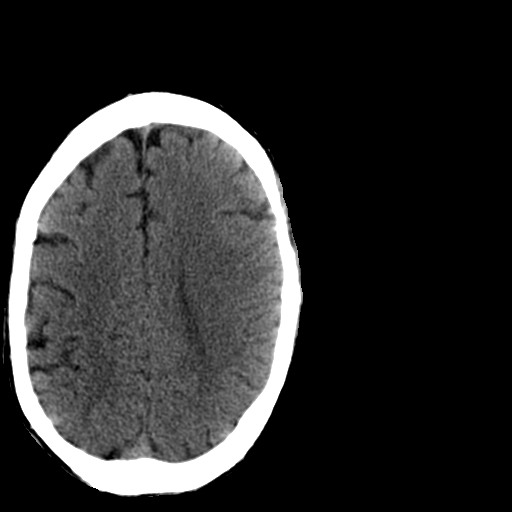
[im 21/30  brain]
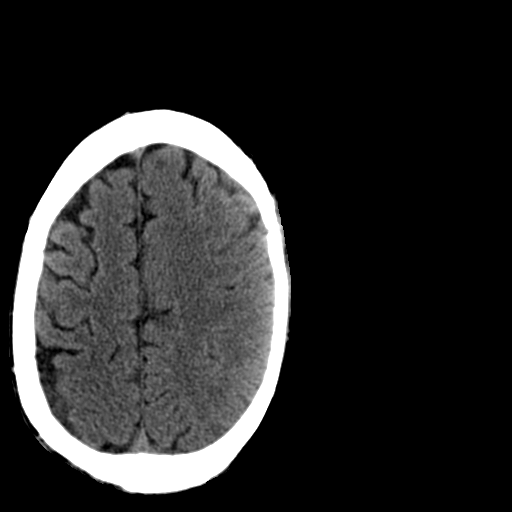
[im 21/30  bone]
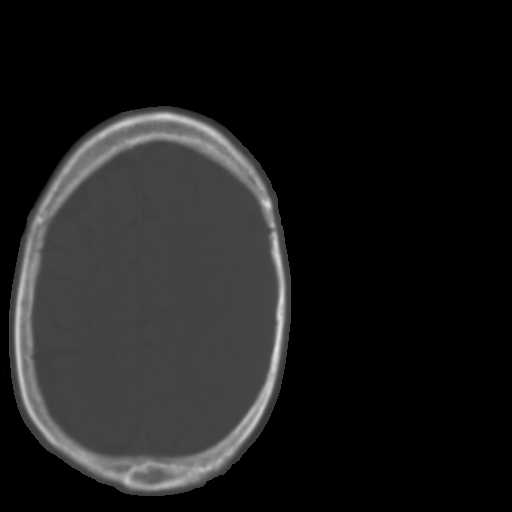
[im 23/30  brain]
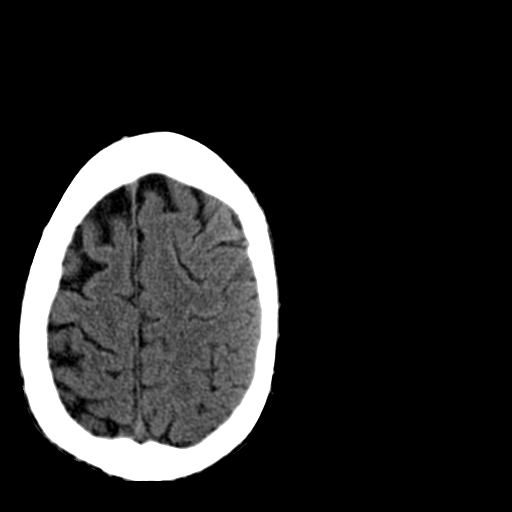
[im 25/30  brain]
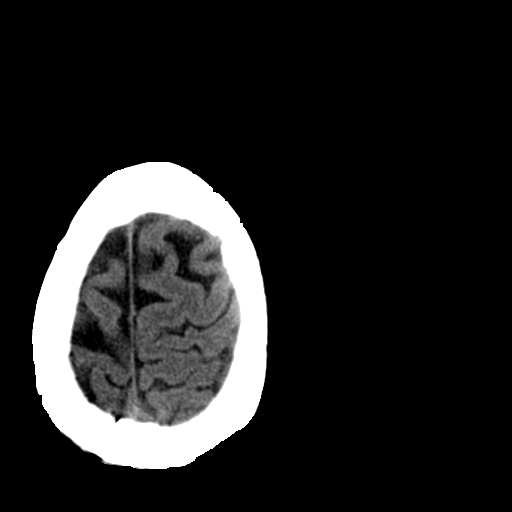
[im 27/30  brain]
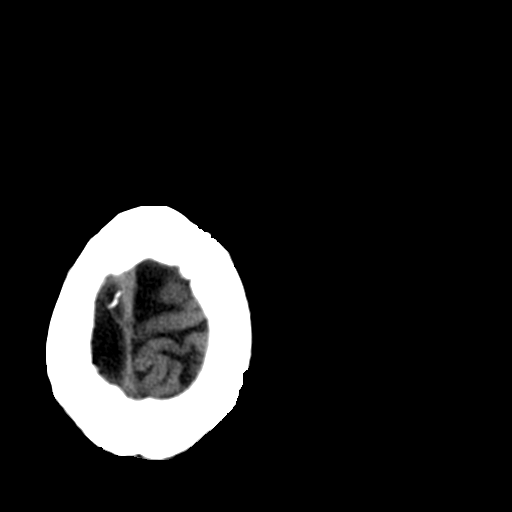

[12 of 30 positions shown; findings below may reference images not displayed]

FINDINGS: Visualized orbits and scalp soft tissues are within
normal limits.  Visualized paranasal sinuses and mastoids are
clear.  No acute osseous abnormality identified.  Sequelae of left
posterior temporal lobe hemorrhage with encephalomalacia and mild
ex vacuo dilatation of the atrium of the left lateral ventricle.
Stable cerebral volume.  Stable ventricle size and configuration.
No midline shift or mass effect. No acute intracranial hemorrhage
identified.  Stable smaller focus of encephalomalacia in the high
right parietal lobe. Stable gray-white matter differentiation
throughout the brain.  No evidence of acute cortically based
infarct identified.
IMPRESSION: No acute intracranial abnormality.

CT  ABDOMEN AND PELVIS WITH CONTRAST
FINDINGS: Scout view shows cardiomegaly and cardiac AICD in place.
Layering pleural effusions are present, right larger than left.
There is ground-glass opacity throughout the lung bases with a
dependent predominance.  The appearance favors pulmonary edema.
There is a small pericardial effusion.  There is marked biatrial
enlargement.  The the liver, gallbladder, pancreas and adrenal
glands are within normal limits.  The spleen is remarkable for a
wedge shaped hypodense lesion at the superior pole, suggestive of
splenic infarct.  There is diffuse mesenteric venous congestion and
haziness.  The portal venous system is inadequately opacified for
evaluation on this exam.  The major abdominal arterial structures
are patent.  There is calcified atherosclerosis of the abdominal
aorta extending into the iliac vessels.  The kidneys are enhancing
with bilateral low-density lesions present measuring 2-2.6 cm
bilaterally and with densitometry suggesting a simple cyst.
Smaller low density areas are also noted, too small to
characterize.  Delayed phase images show renal excretion of
contrast bilaterally.  The stomach is decompressed.  The duodenum
and proximal small bowel loops are within normal limits.  No oral
contrast was administered.  The sigmoid is mildly redundant.  There
are occasional colonic diverticula. Trauma, but visualized distal
small bowel loops are within normal limits.  The appendix is not
delineated, but no focal inflammatory changes are identified in the
cecum.  No free fluid identified in the abdomen.
IMPRESSION: 1.  Anasarca.  Pulmonary edema with layering pleural effusions, and
diffuse abdominal mesenteric venous congestion.
2.  Upper pole splenic infarct, age indeterminate.
3.  Bilateral probable simple renal cysts.

CT PELVIS
FINDINGS: There is a small volume of free fluid layering in the
deep pelvis.  The bladder is distended but otherwise within normal
limits.  Several distal small bowel loops in the pelvis are mildly
increased in size, but measure up to 2.1 cm in diameter, within
normal limits.  Distal colon is within normal limits aside from
mild tortuosity of the sigmoid.  Major pelvic arterial structures
are patent with calcified atherosclerosis throughout.  Diffuse
stranding of pelvic and to a lesser extent subcutaneous fat
suggestive of anasarca.  Degenerative changes in the lower lumbar
spine as detailed above. Advanced multilevel degenerative changes
in the spine.  This includes grade 1 anterolisthesis of L4 on L5
with chronic pars defects and calcified disc osteophyte complex.
Bilateral L5-S1 pars defects are also present, but there is no
spondylolisthesis at that level.  No acute osseous abnormality
identified.
IMPRESSION: 1.  Anasarca.  Small volume of free fluid in the deep pelvis.
2.  Multilevel spondylolysis and spondylolisthesis in the lower
lumbar spine as detailed above.

## 2010-01-08 IMAGING — CR DG CHEST 2V
2 series · 2 of 2 positions shown · non-contrast
Comparison: 06/07/2008

CLINICAL DATA: Syncope/weakness/pulmonary edema

CHEST - 2 VIEW

[w chest pa]
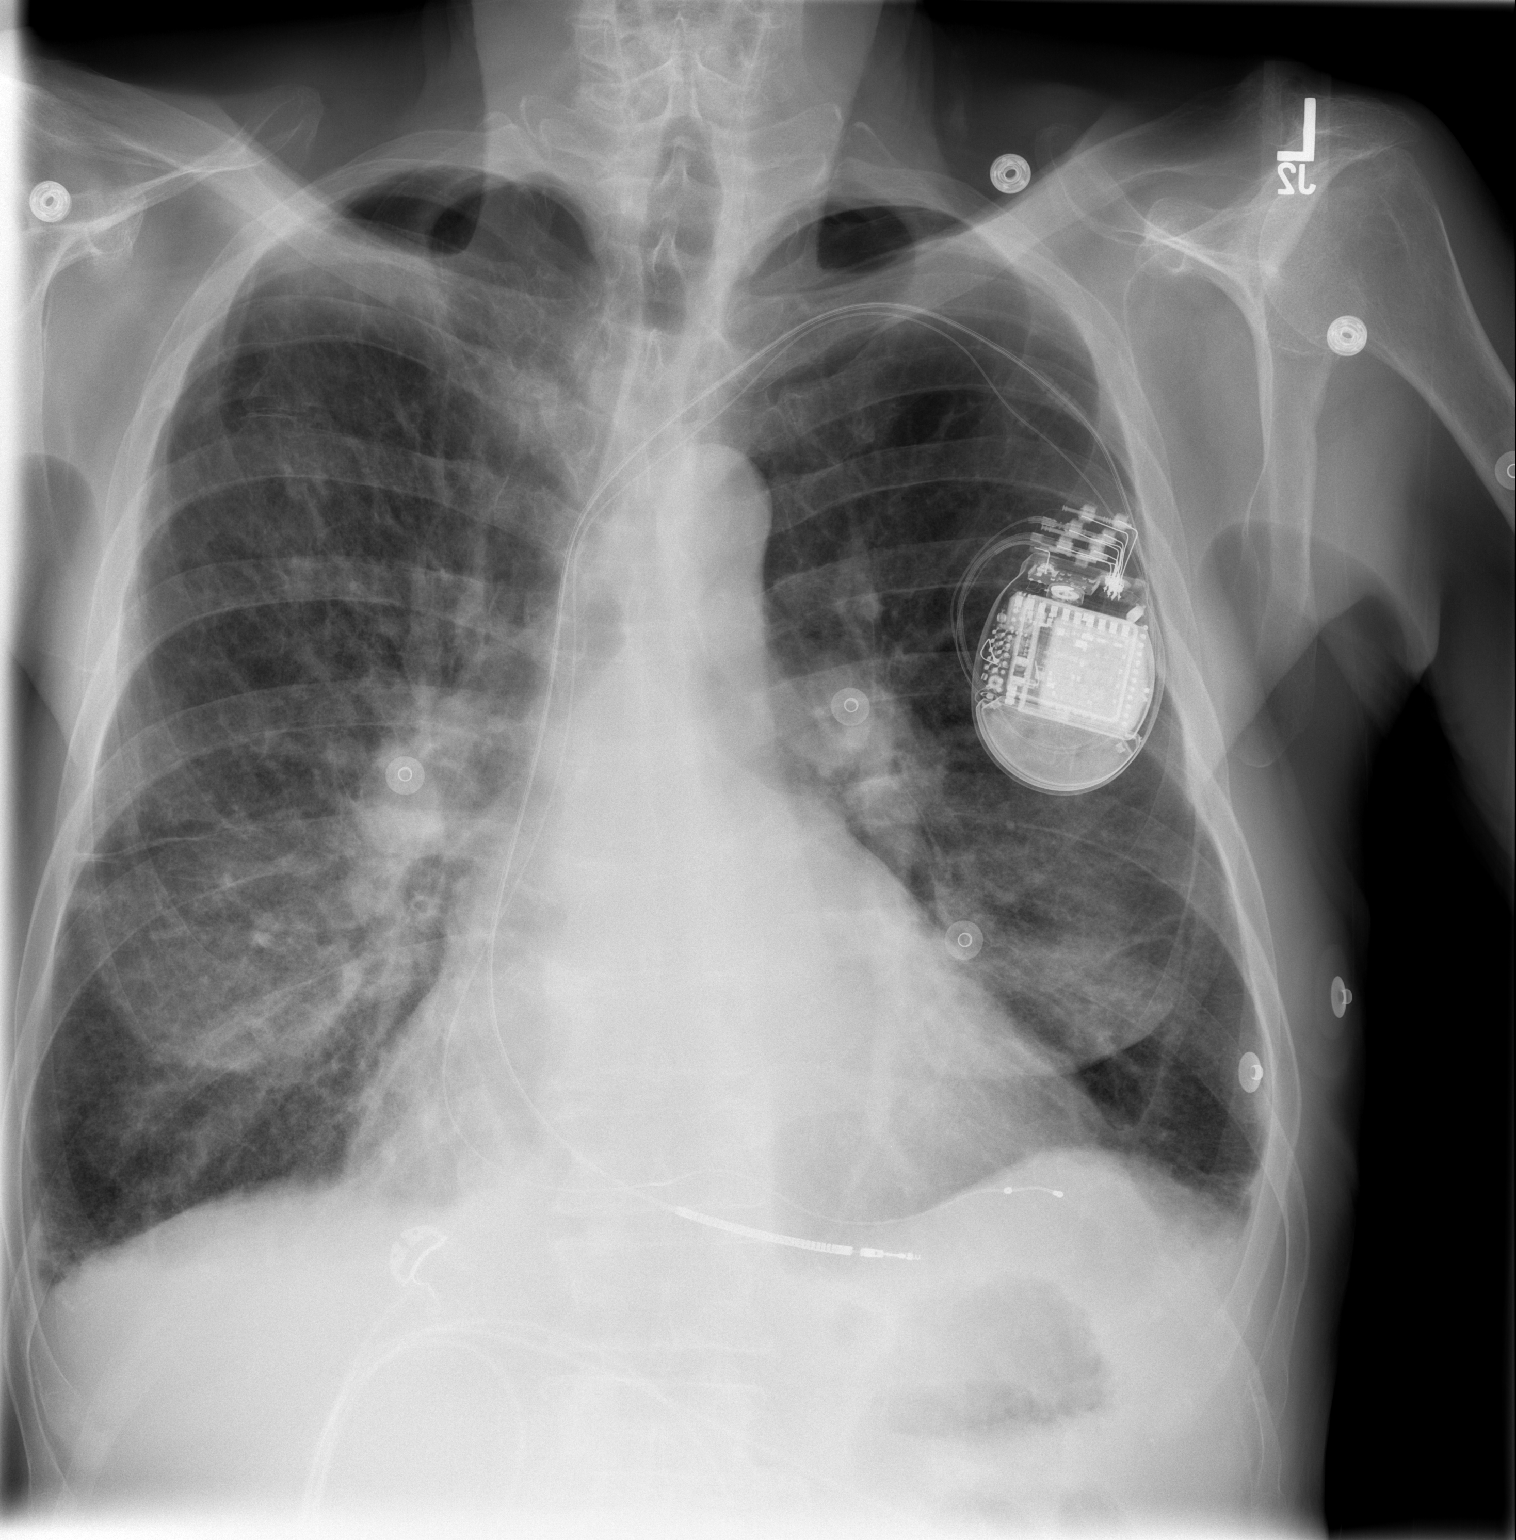

[w chest lat]
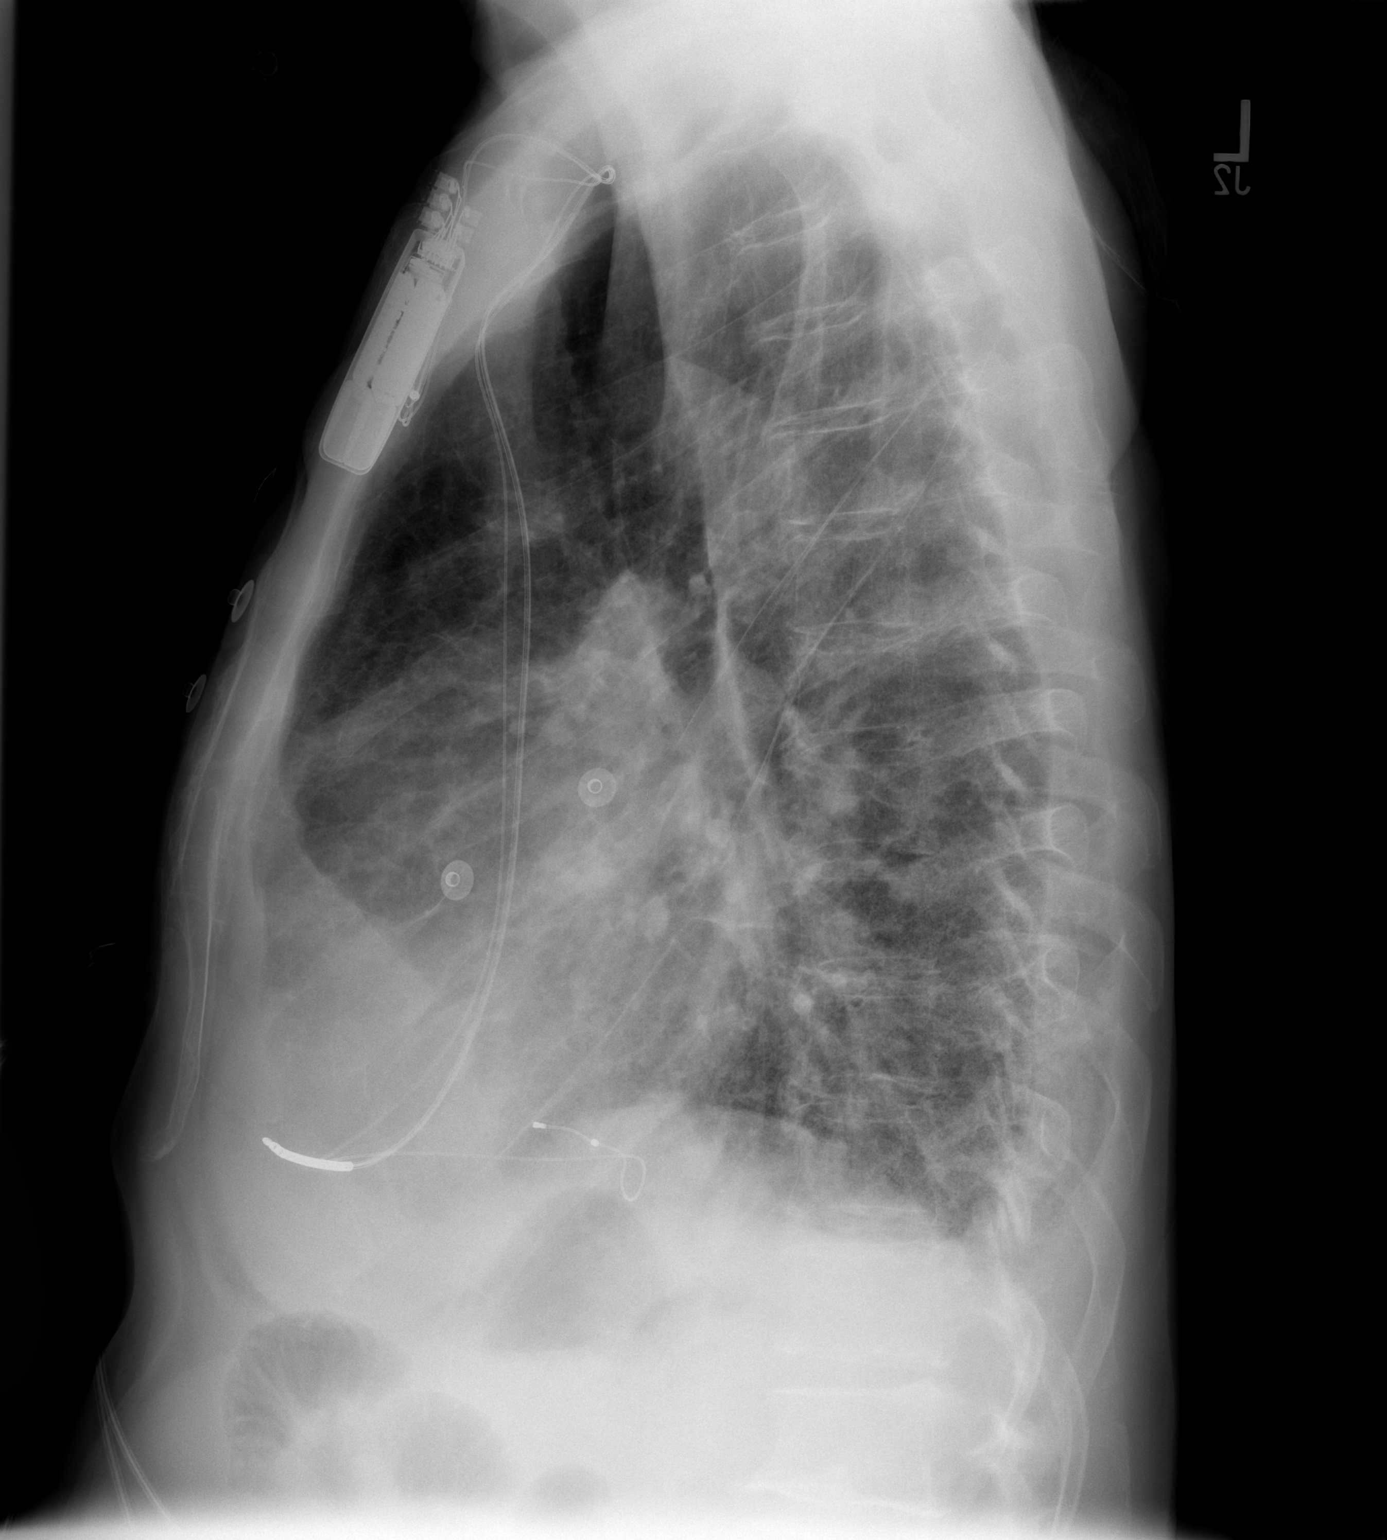

[2 of 2 positions shown; findings below may reference images not displayed]

FINDINGS: The significant interval improvement with decreased
interstitial pulmonary edema.  The heart remains enlarged.  There
is still some prominence of interstitial markings.  No significant
pleural fluid.  Pacemaker unchanged.
IMPRESSION: Significant interval improvement in congestive heart
failure/pulmonary edema.

## 2010-01-19 IMAGING — CR DG CHEST 1V PORT
1 series · 1 of 1 positions shown · non-contrast
Comparison: 06/09/2008

CLINICAL DATA: Assess PICC line placement

PORTABLE CHEST - 1 VIEW

[view not recorded]
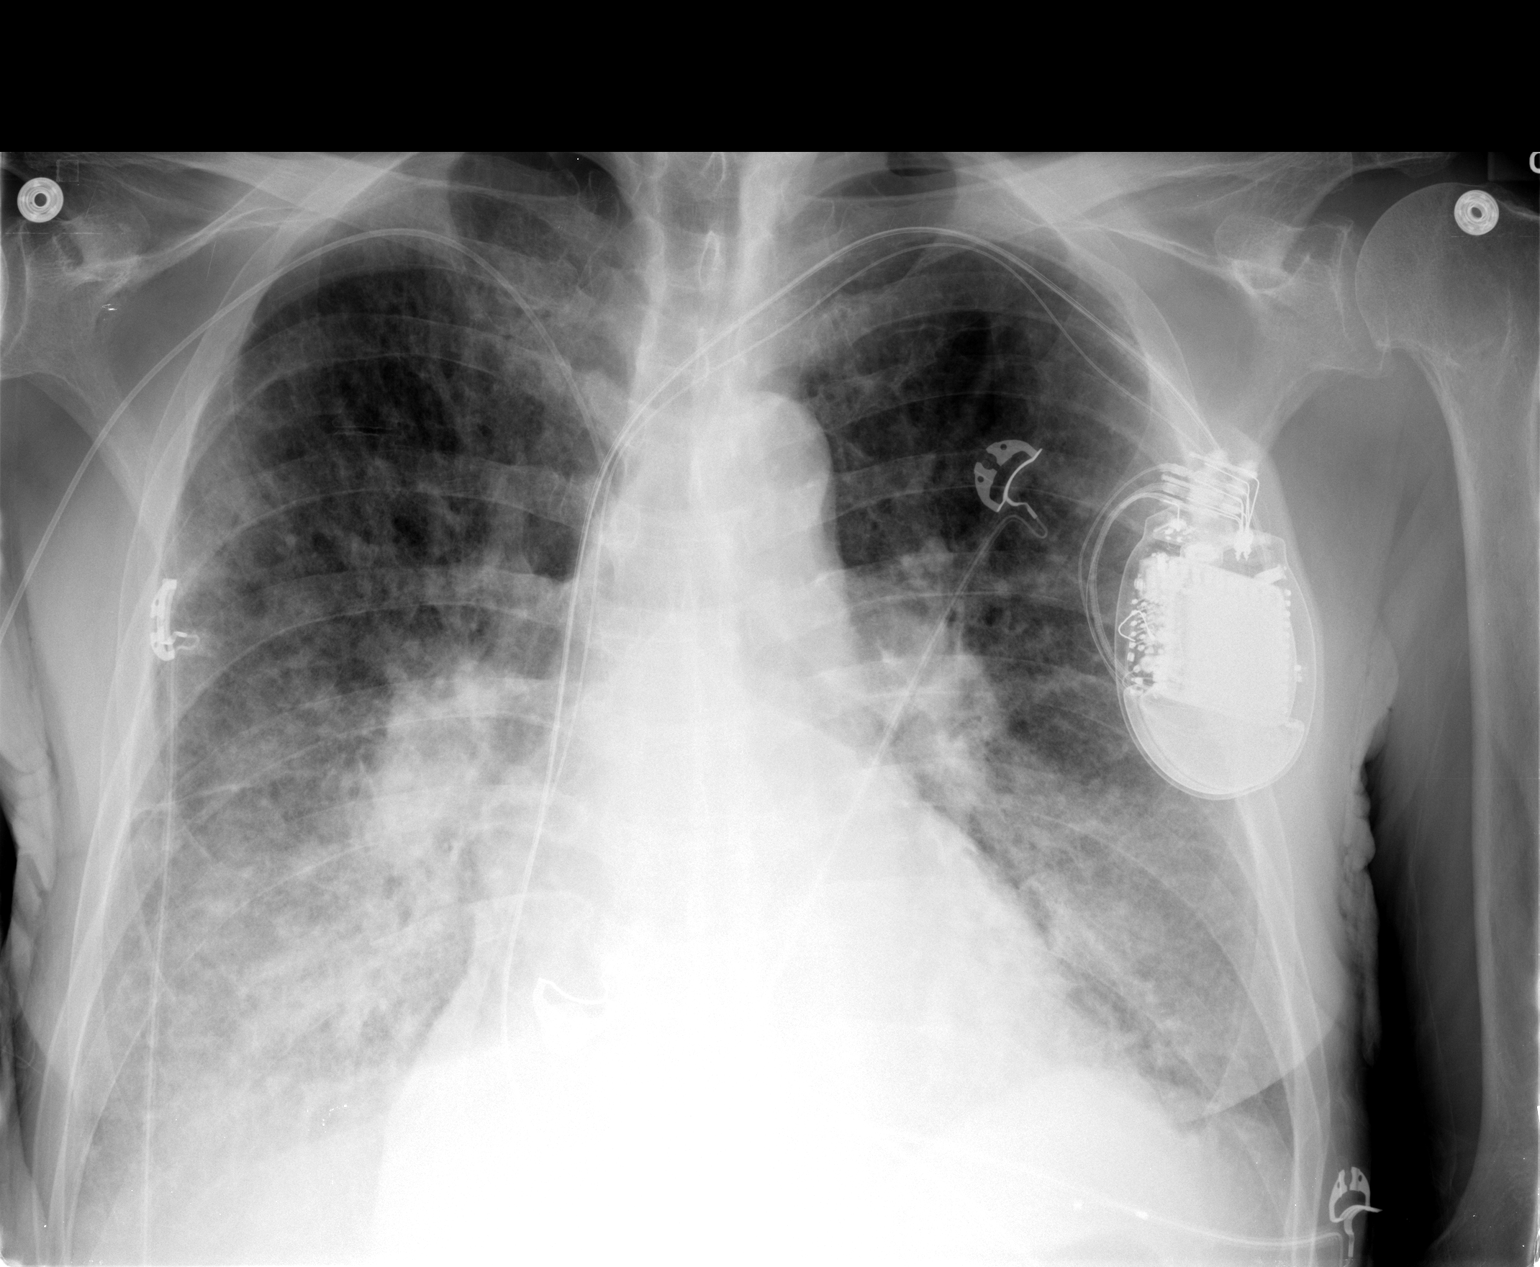

[1 of 1 positions shown; findings below may reference images not displayed]

FINDINGS: There is a right arm PICC line with tip in the SVC.

There is a left chest wall pacer device with leads in the right
ventricle.

The heart size is enlarged.

There is diffuse pulmonary interstitial edema which has increased
from prior exam.
IMPRESSION: 1.  Moderate to marked CHF pattern.
2.  PICC line tip is in the SVC.

## 2010-01-30 IMAGING — CT CT HEAD W/O CM
1 series · 16 of 30 positions shown, 20 images · non-contrast
Comparison: 06/07/2008.

CLINICAL DATA: Headaches.  History of stroke.

CT HEAD WITHOUT CONTRAST
TECHNIQUE: Contiguous axial images were obtained from the base of
the skull through the vertex without contrast.

[Series 2: head_seq 4.5 h37s st · axial · 0.43mm/px · z∈[-141,+3]mm · 16 of 36 slices shown, 20 images]
[im 2/36  brain]
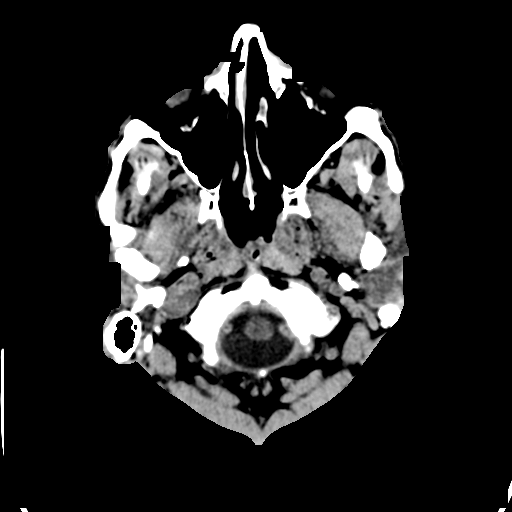
[im 2/36  bone]
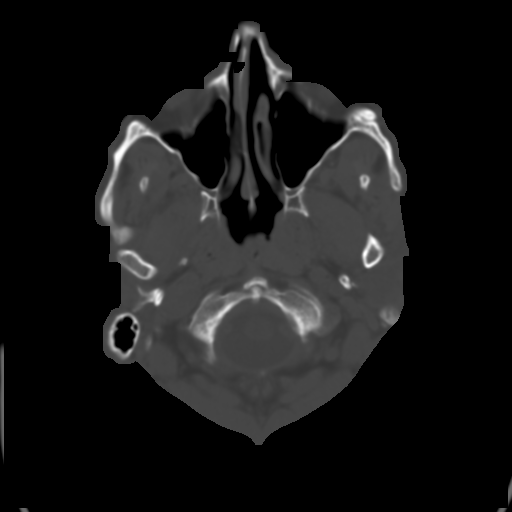
[im 4/36  brain]
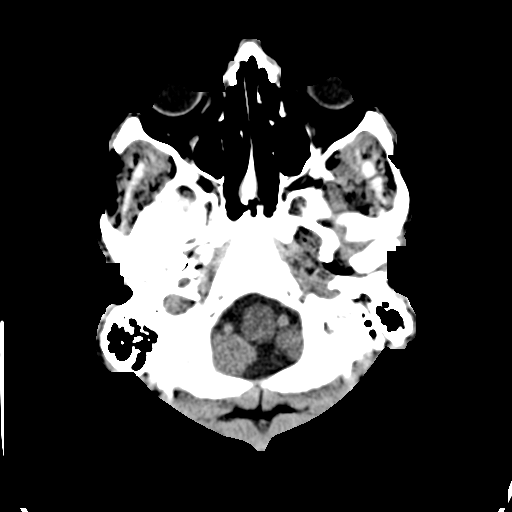
[im 7/36  brain]
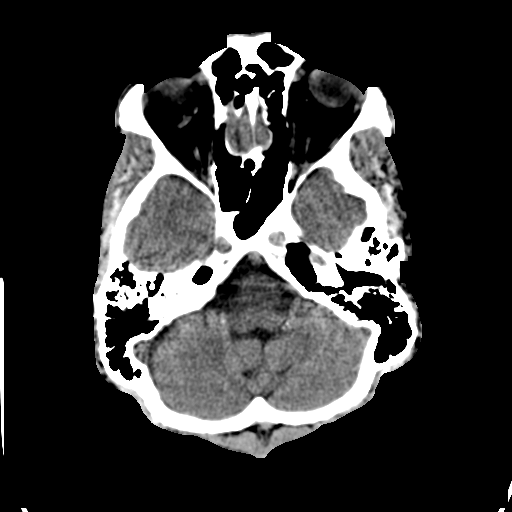
[im 9/36  brain]
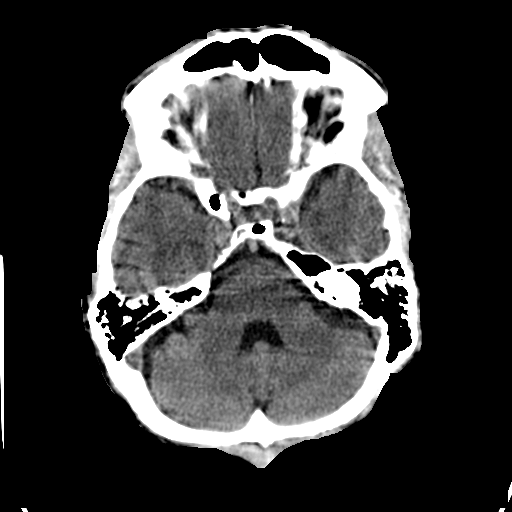
[im 10/36  brain]
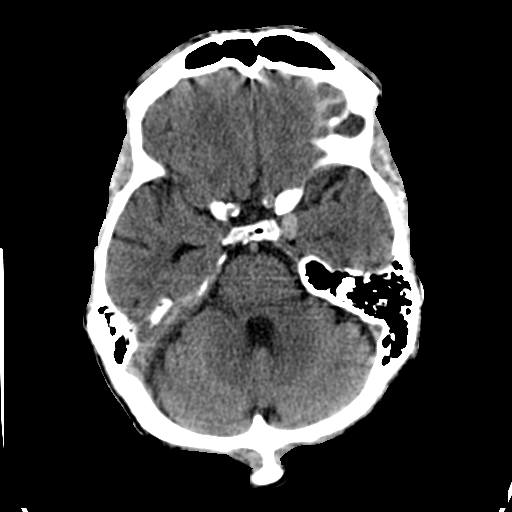
[im 10/36  bone]
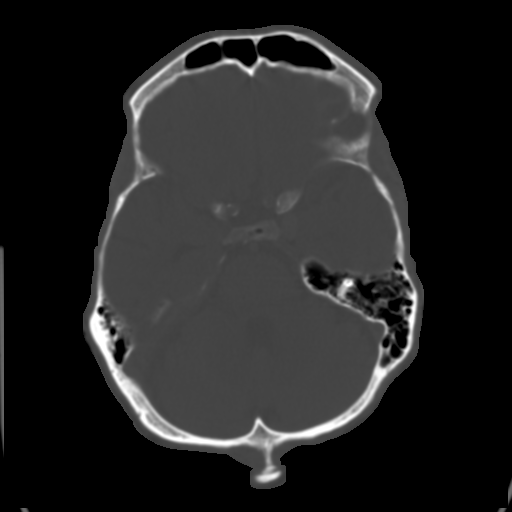
[im 13/36  brain]
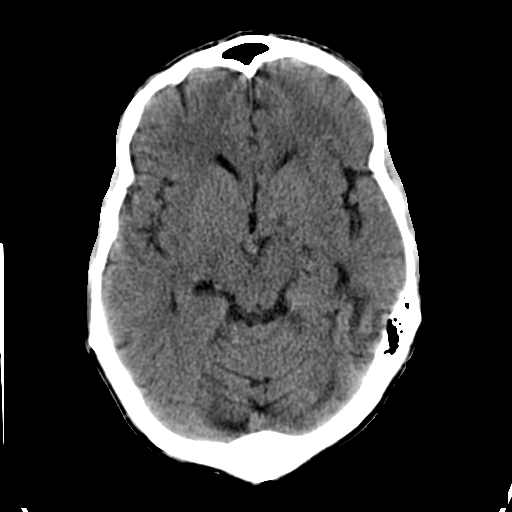
[im 15/36  brain]
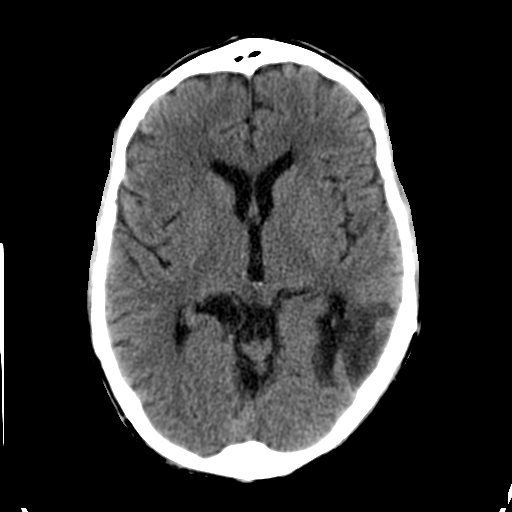
[im 17/36  brain]
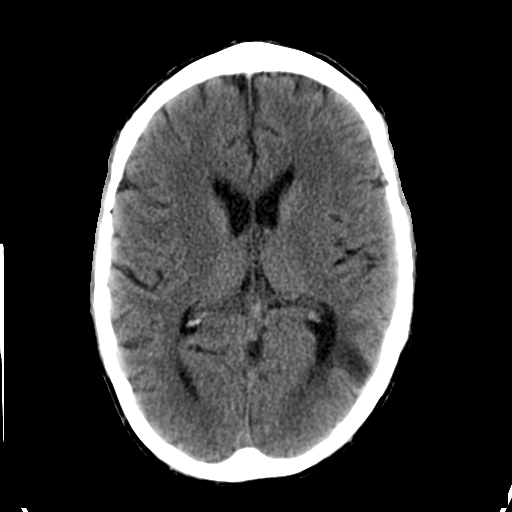
[im 19/36  brain]
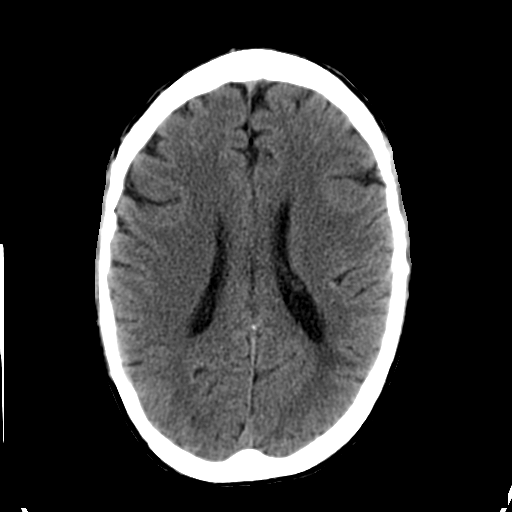
[im 19/36  bone]
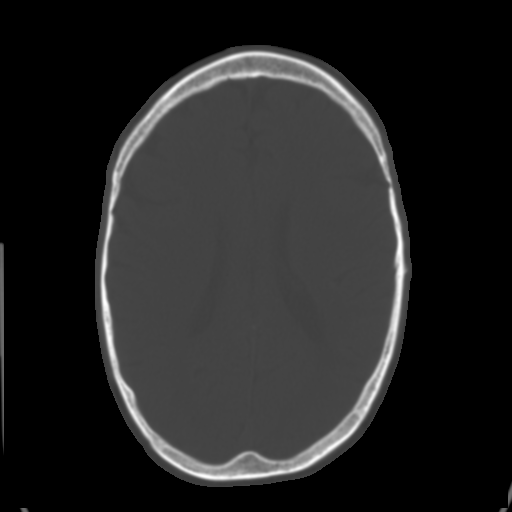
[im 21/36  brain]
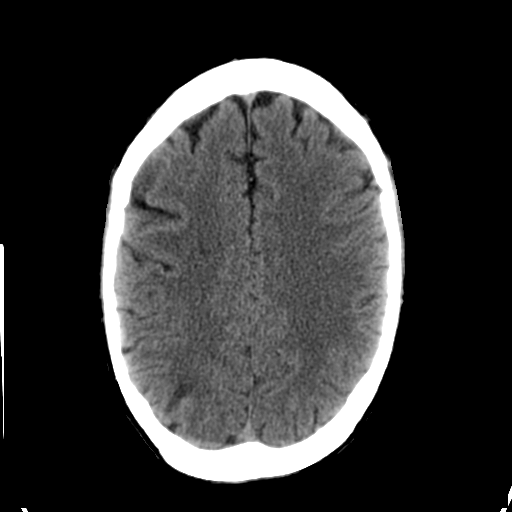
[im 23/36  brain]
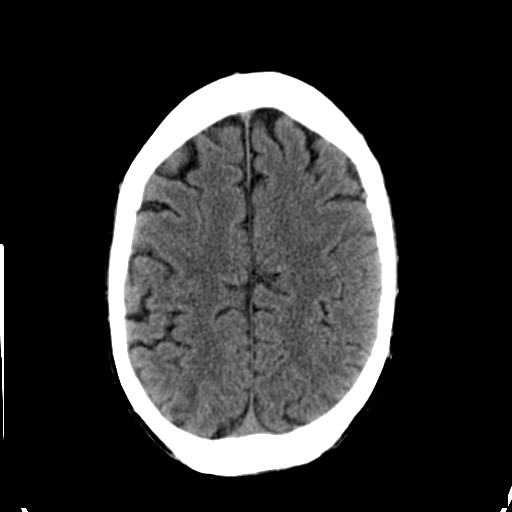
[im 26/36  brain]
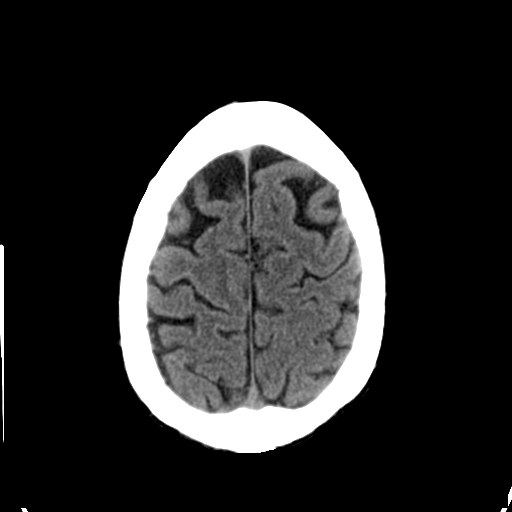
[im 27/36  brain]
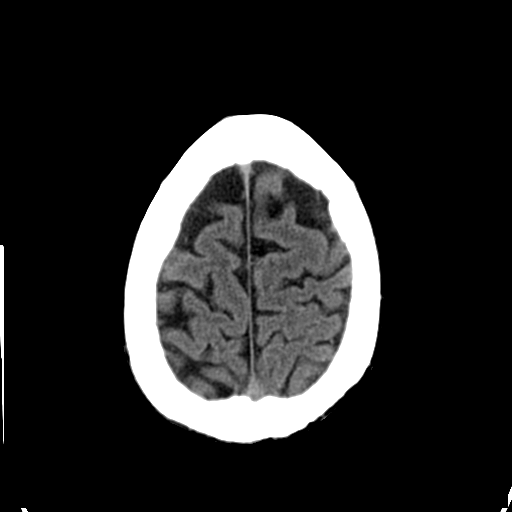
[im 27/36  bone]
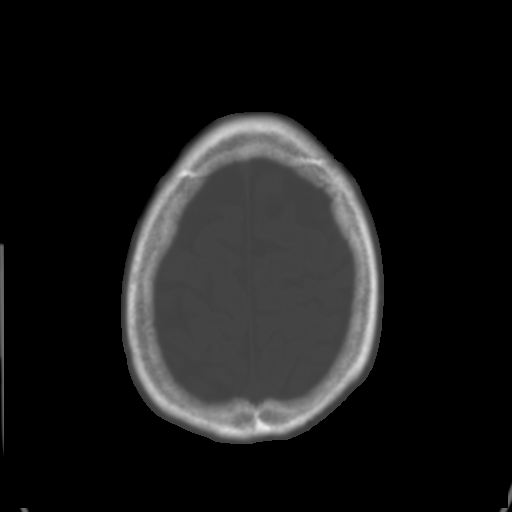
[im 29/36  brain]
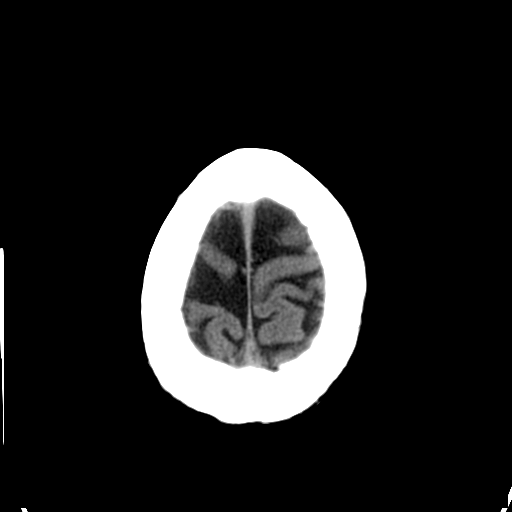
[im 32/36  brain]
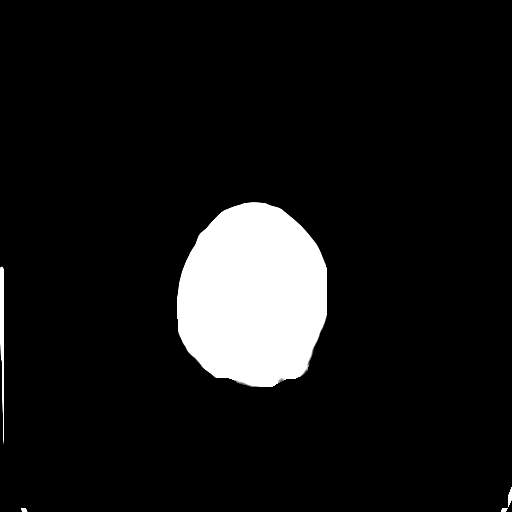
[im 34/36  brain]
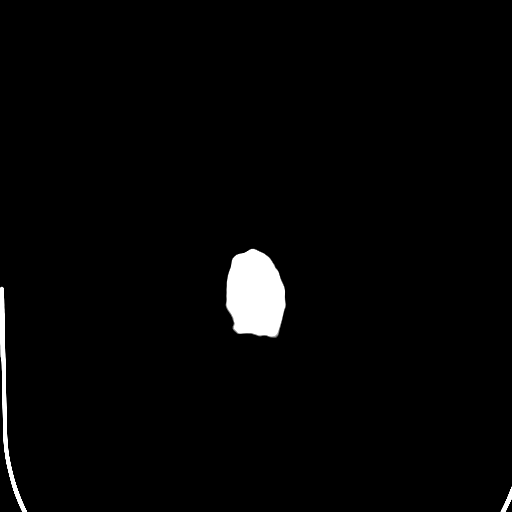

[16 of 30 positions shown; findings below may reference images not displayed]

FINDINGS: Chronic infarct and encephalomalacia in the left temporal
parietal lobe is unchanged.  There is also a chronic infarct in the
right occipital parietal lobe which is unchanged.  There is no
acute infarct.  There is no hemorrhage.  The ventricles are not
enlarged.  Negative for mass lesion.  There is no acute skull
abnormality.
IMPRESSION: Chronic ischemia bilaterally.  No acute abnormality.

## 2010-02-12 ENCOUNTER — Encounter: Payer: Self-pay | Admitting: Internal Medicine

## 2010-02-13 IMAGING — CR DG CHEST 1V PORT
1 series · 1 of 1 positions shown · non-contrast
Comparison: 06/20/2008

CLINICAL DATA: Pacemaker insertion

PORTABLE CHEST - 1 VIEW

[AP]
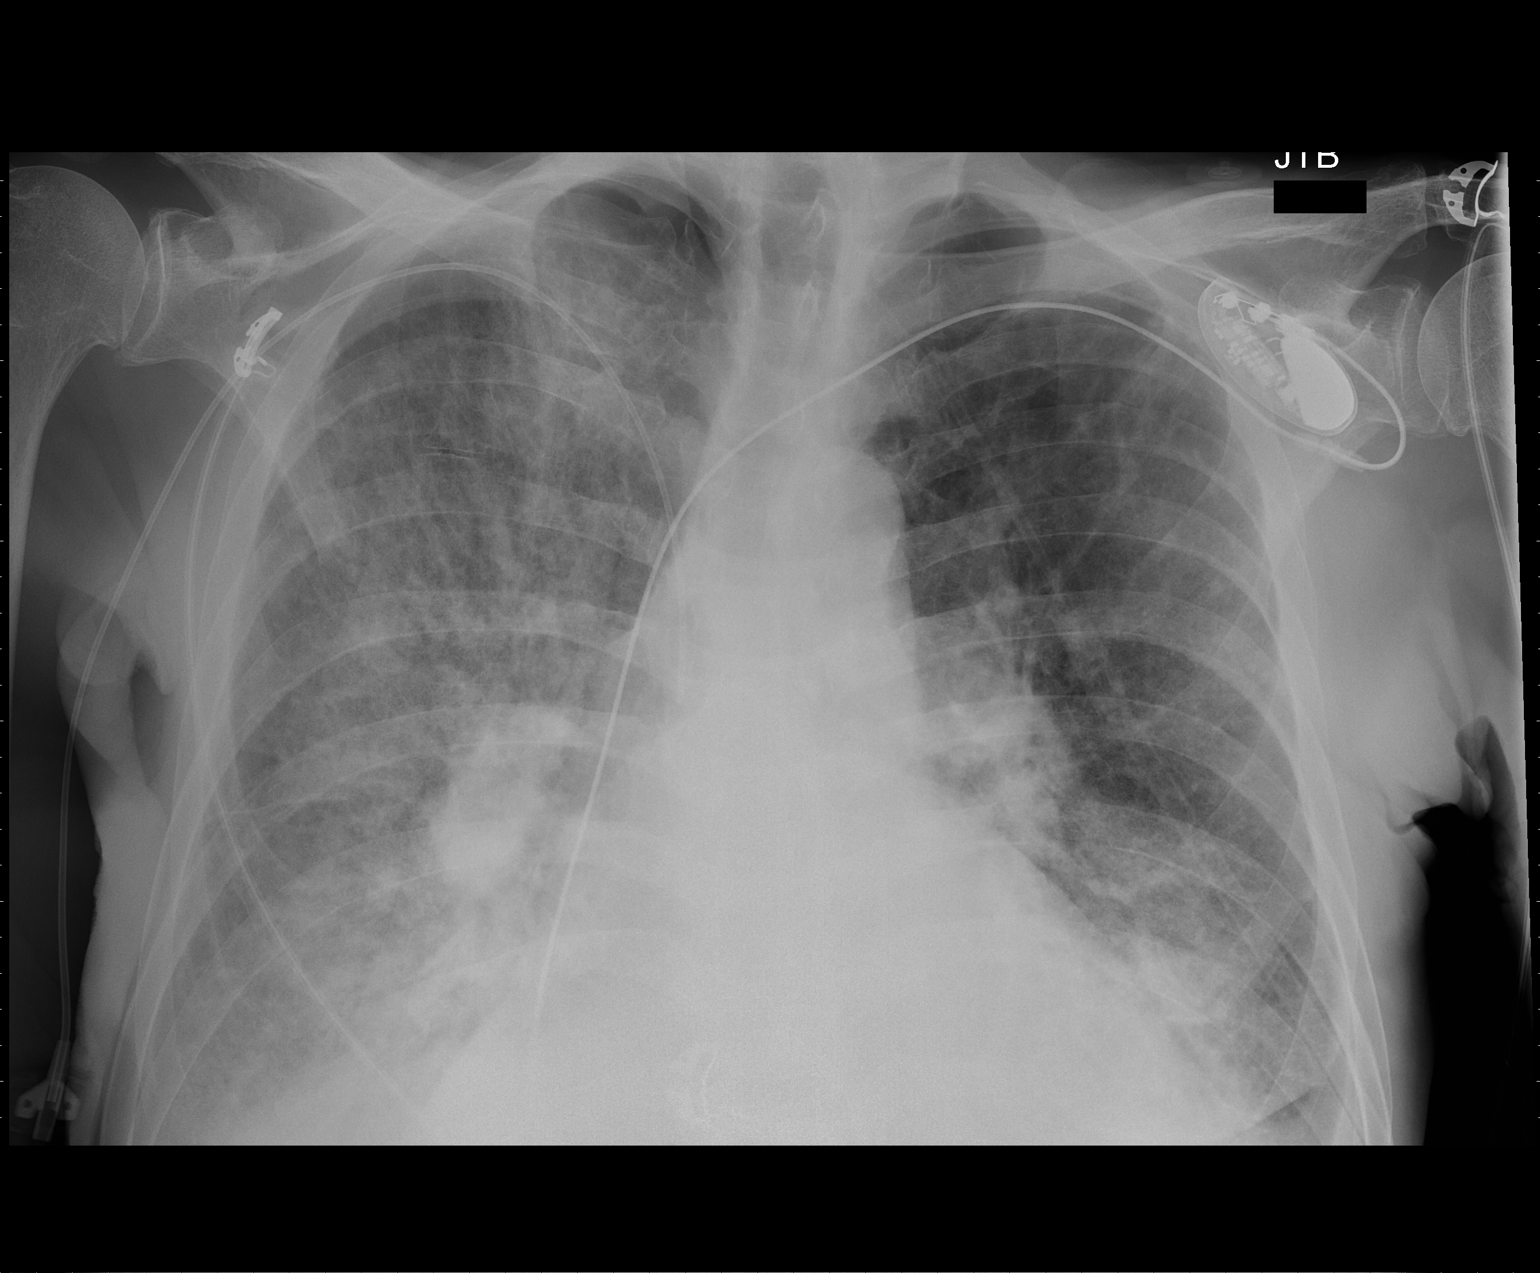

[1 of 1 positions shown; findings below may reference images not displayed]

FINDINGS: Is moderately enlarged.  Vascular congestion is stable.
Bilateral airspace edema right greater than left has worsened.
Bilateral effusions are again noted.  No pneumothorax.  Left
subclavian pacemaker device with only one lead is present.  This is
a new device.  The prior device with two leads have been removed.
The tip is beyond the lower limit the study.  A right upper
extremity PICC is stable.  Thorax.
IMPRESSION: Cardiomegaly and slight worsening of edema.

New left subclavian pacemaker device.  Tip is beyond the lower
limit of this study.

Right PICC is stable.

## 2010-02-14 IMAGING — CR DG CHEST 2V
2 series · 2 of 2 positions shown · non-contrast
Comparison: Portable chest x-rays yesterday and 06/20/2008.  Two-
view chest x-ray 06/09/2008.

CLINICAL DATA: History of endocarditis.  Follow up edema and
effusions.  Preop CABG.

CHEST - 2 VIEW 07/16/2008:

[w chest pa]
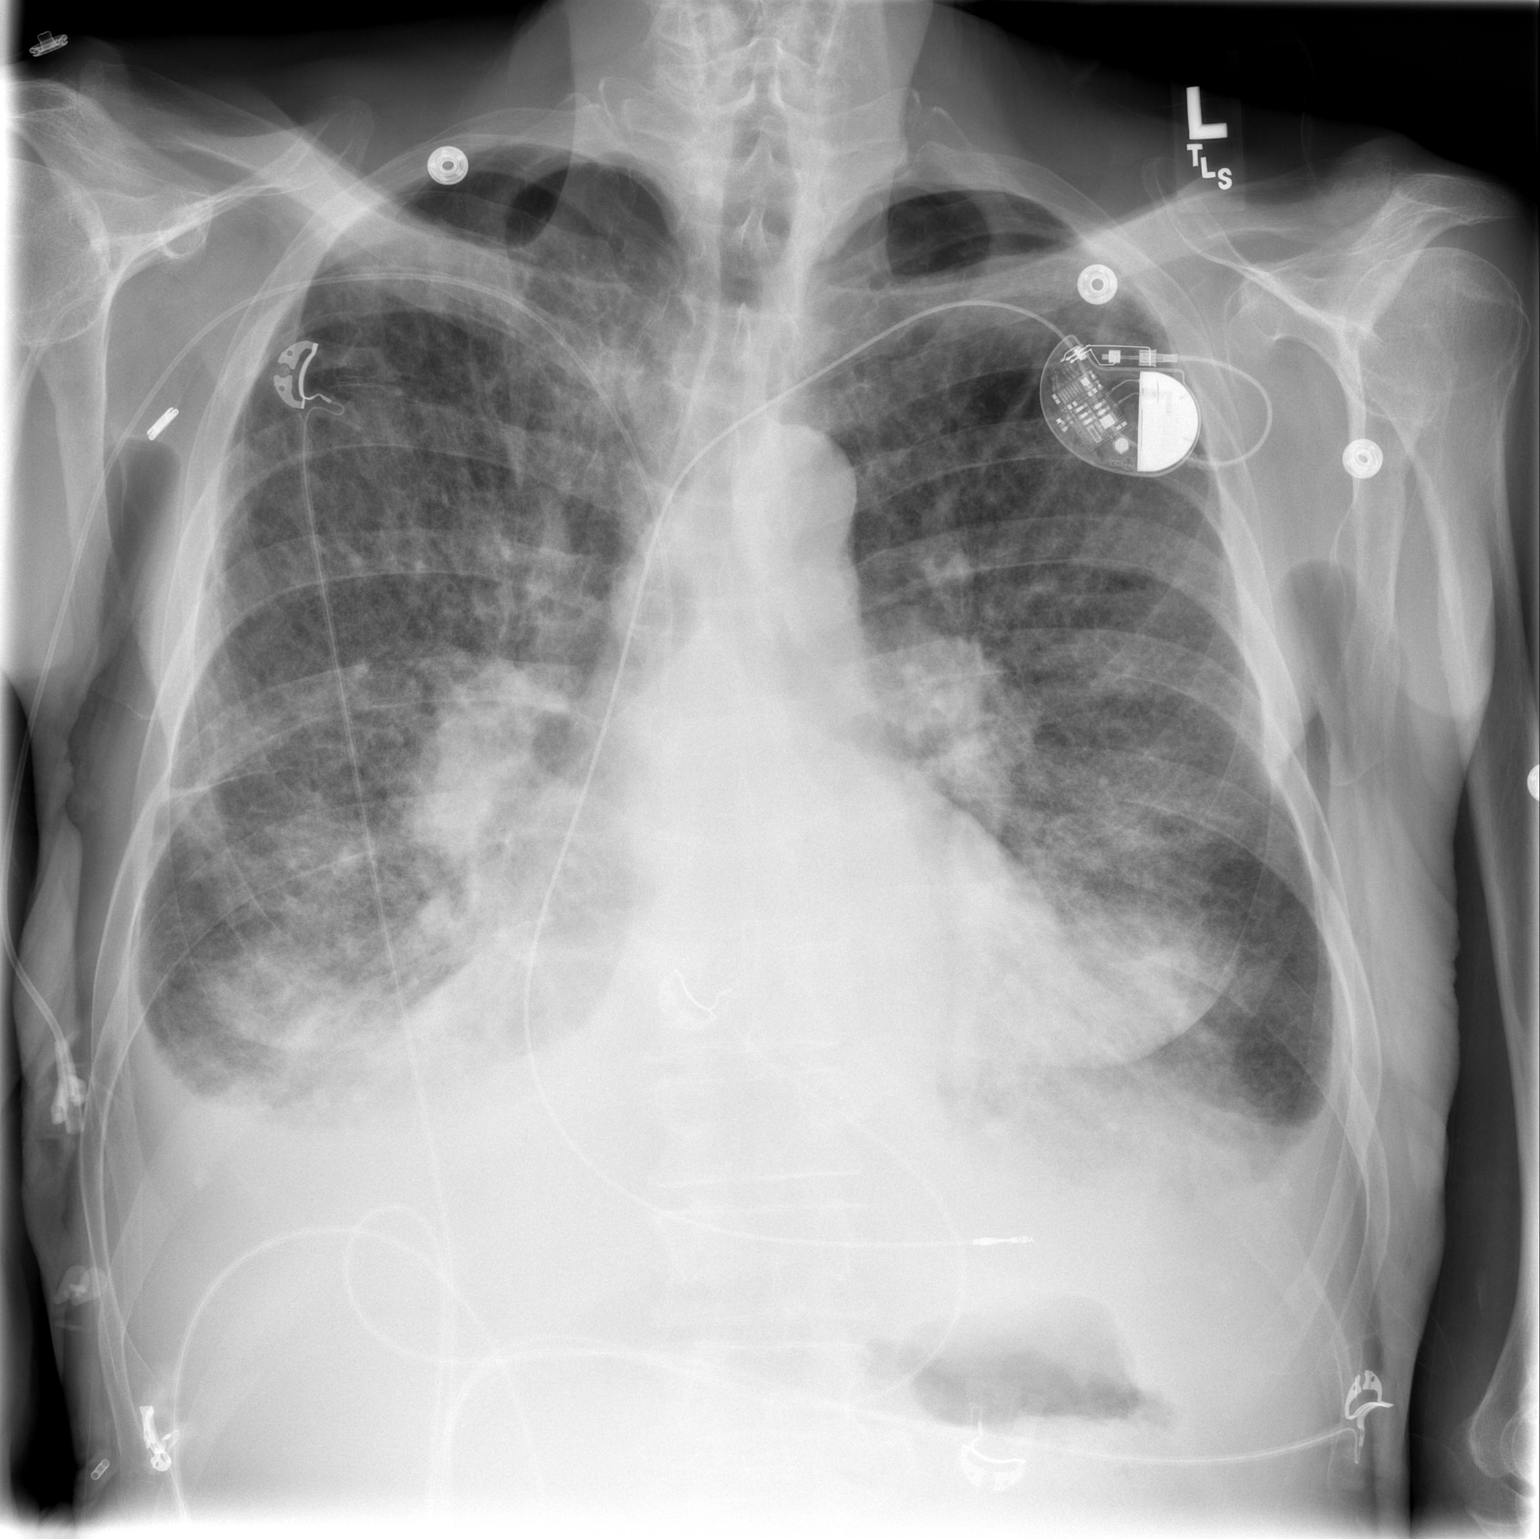

[w chest lat]
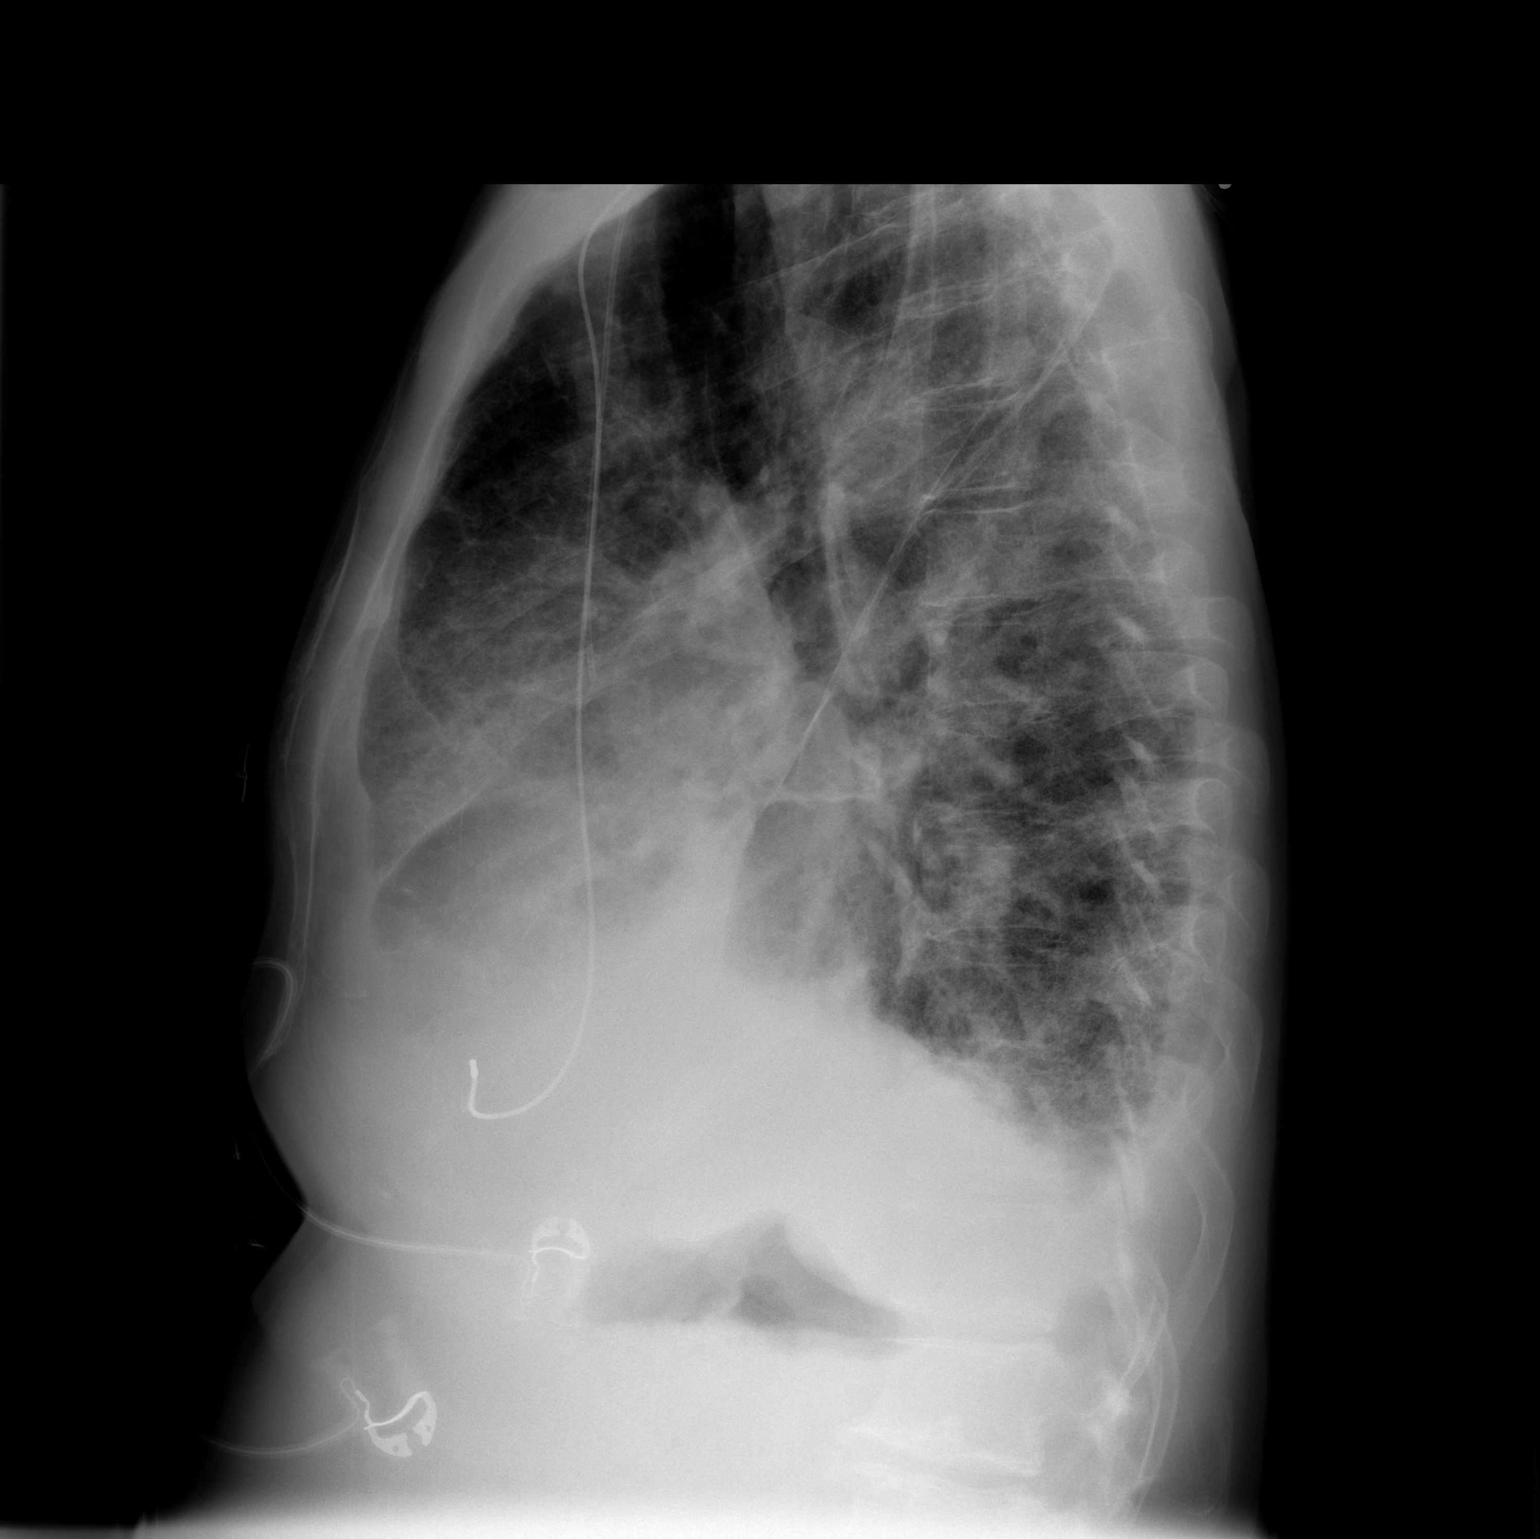

[2 of 2 positions shown; findings below may reference images not displayed]

FINDINGS: Heart enlarged but stable.  Improvement in the
interstitial and airspace pulmonary edema since yesterday, though
moderate edema persists.  Stable moderately large bilateral pleural
effusions and associated passive atelectasis in the lower lobes.
Left subclavian single lead transvenous pacemaker with its lead tip
at the RV apex.  Right arm PICC tip in the SVC.  Visualized bony
thorax intact.
IMPRESSION: Improvement in the interstitial and airspace pulmonary edema since
yesterday, though moderate edema persists.  Stable bilateral
pleural effusions and passive atelectasis in the lower lobes.  No
new abnormalities.

## 2010-02-15 IMAGING — CR DG CHEST 1V PORT
2 series · 2 of 2 positions shown · non-contrast
Comparison: Portable exam 1718 hours compared to 07/16/2008

CLINICAL DATA: Coronary artery disease status post CABG

PORTABLE CHEST - 1 VIEW

[AP (1 of 2)]
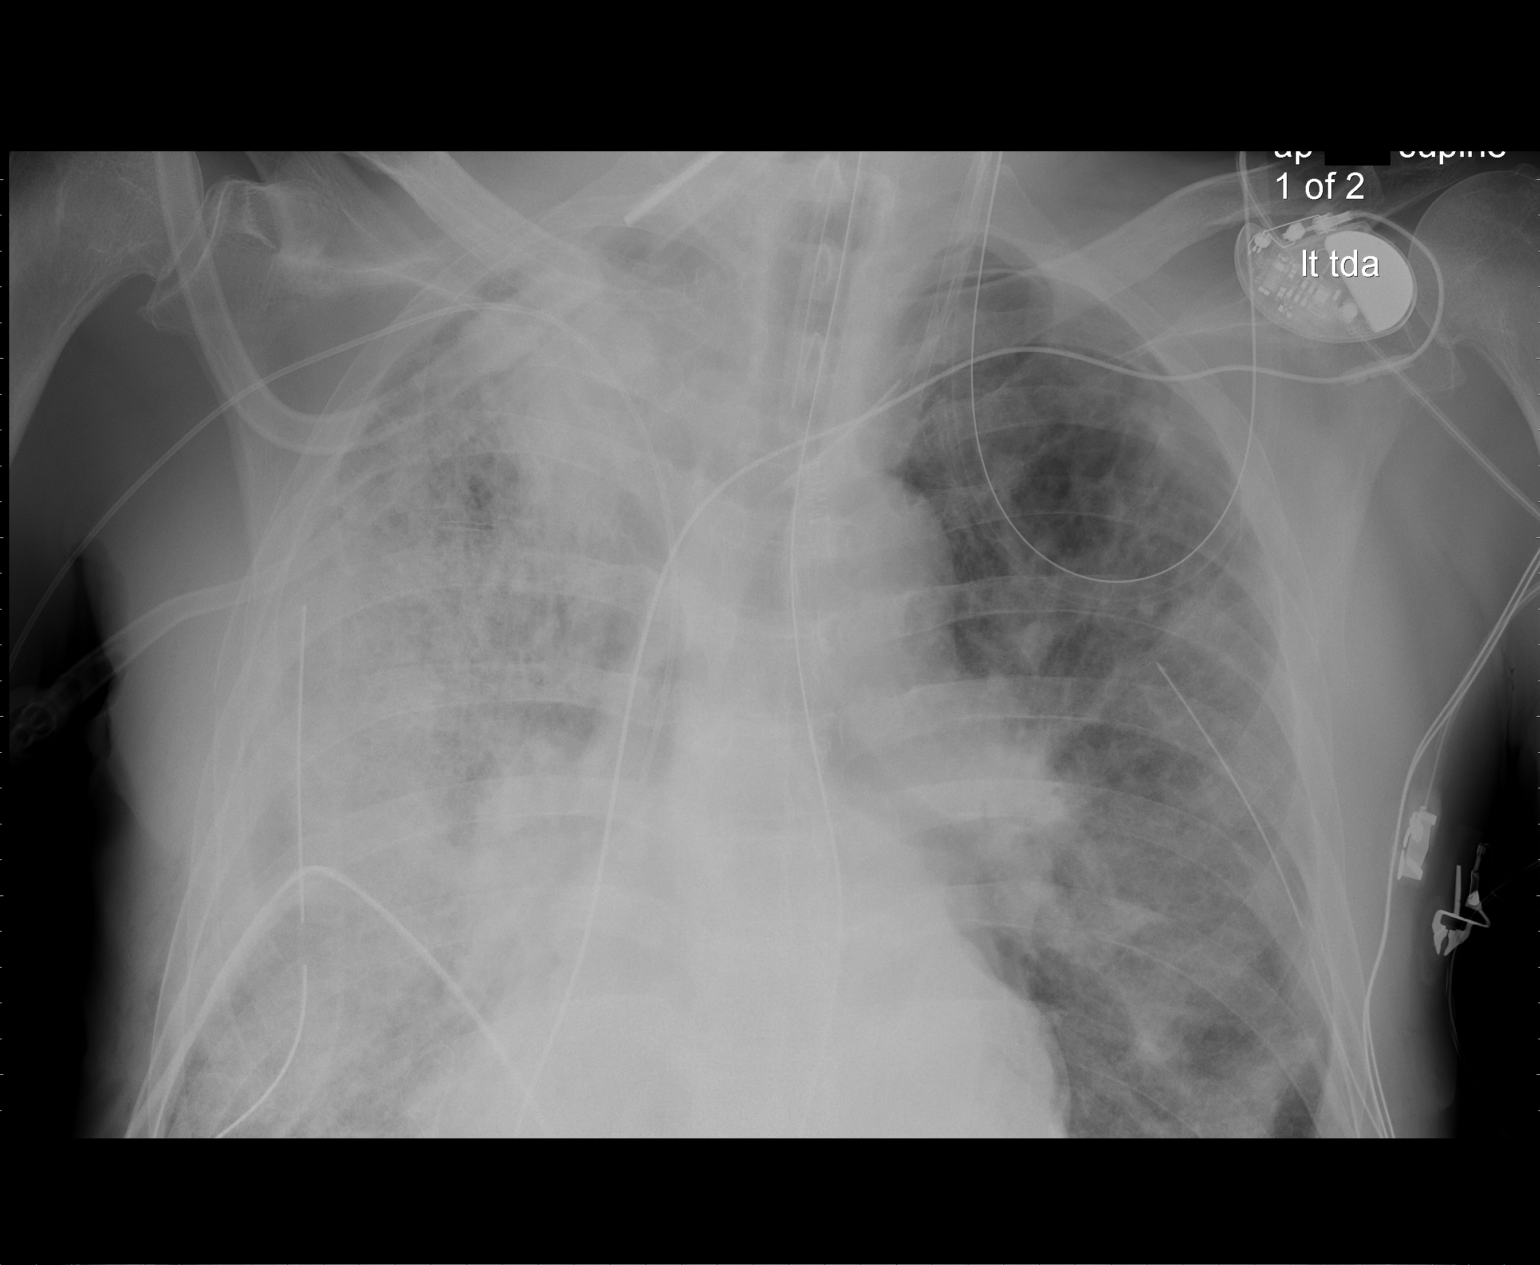

[AP (2 of 2)]
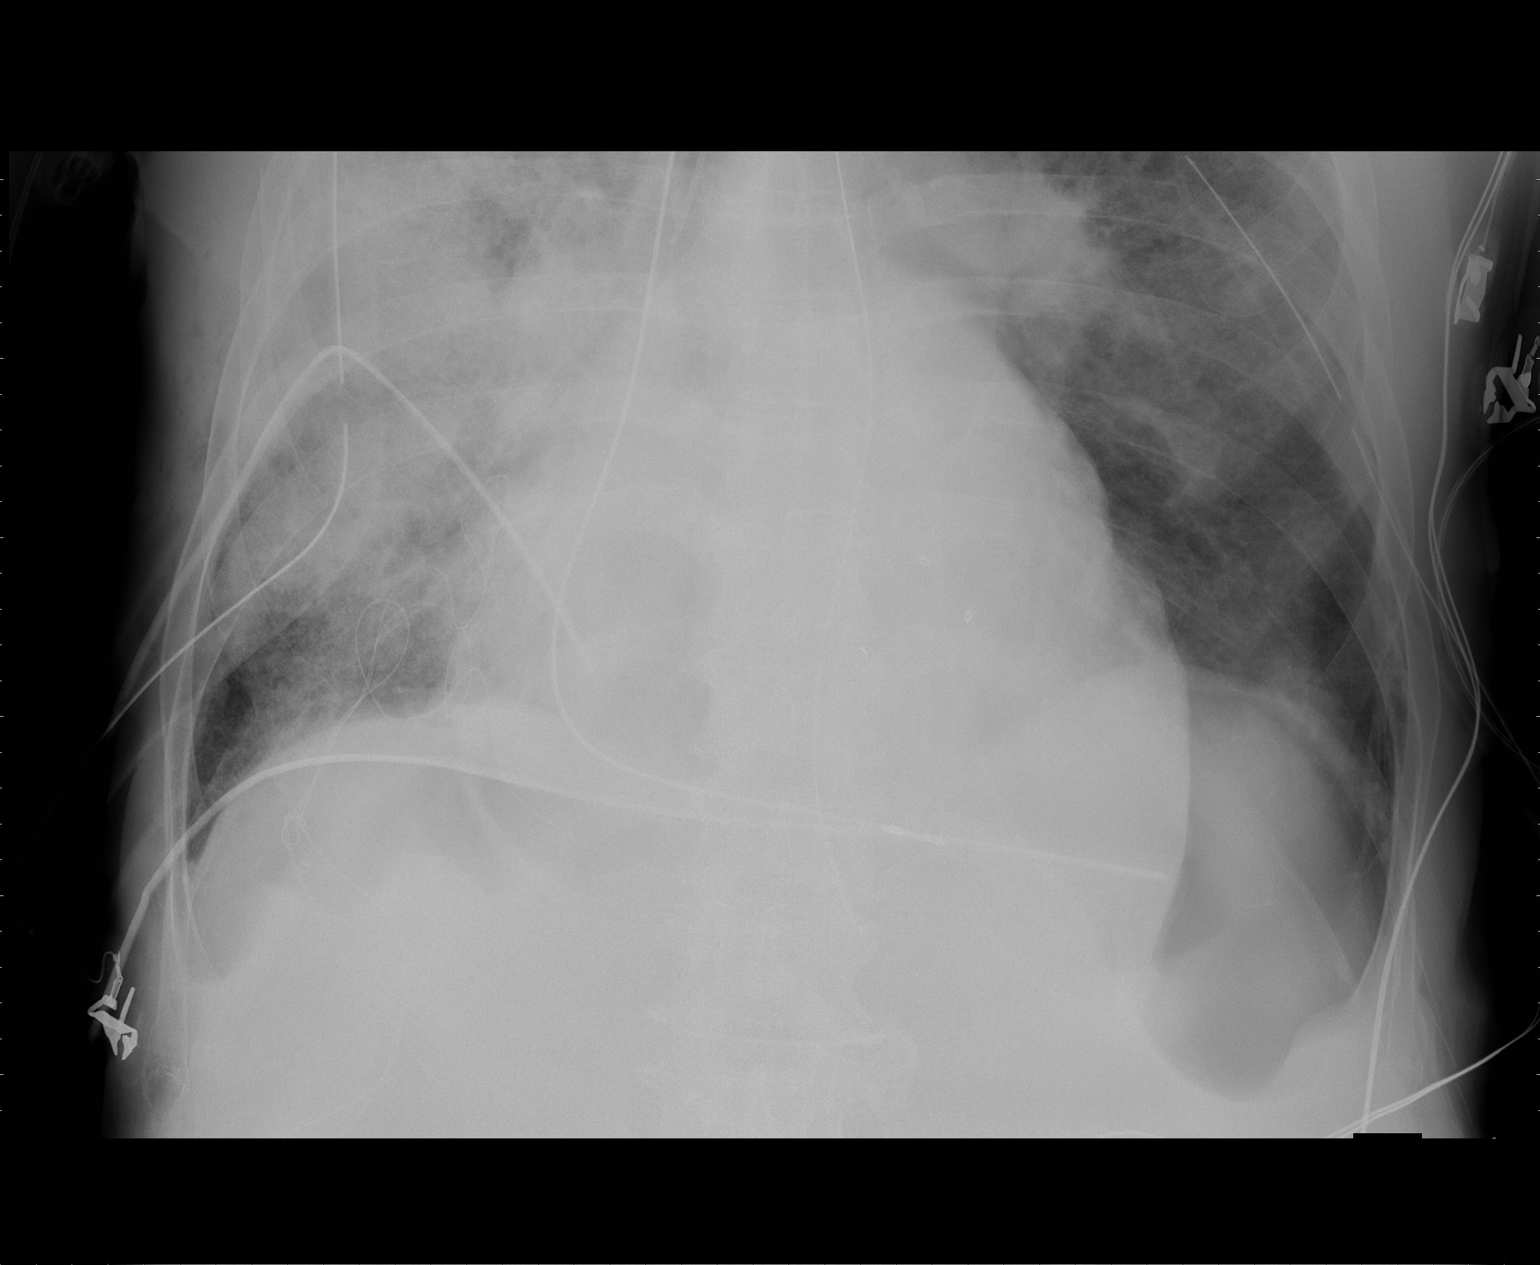

[2 of 2 positions shown; findings below may reference images not displayed]

FINDINGS: Tip of endotracheal tube 7.8 cm above carina.
Nasogastric tube extends to the gastroesophageal junction.
Bilateral thoracostomy tubes.
Right jugular line, tip left brachial cephalic vein.
Right arm PICC line, tip SVC.
Cardiac enlargement pulmonary vascular congestion.
Diffuse pulmonary edema increased since previous exam particularly
in the right lung.
Epicardial pacing wires project over lower right chest.
No definite right-sided pneumothorax.
Question fracture at lateral margin of right fifth rib.

Gaseous lucency identified adjacent to left heart border in the
left upper quadrant.
It is uncertain whether this gas collection represents air within
the stomach, lingula with poor visualization of pulmonary markings,
or basilar pneumothorax.
IMPRESSION: Recommend advance nasogastric tube into stomach, approximately 10
cm to position proximal side port at the gastroesophageal junction
Gas collection in left upper quadrant, adjacent left heart border,
cannot exclude basilar pneumothorax though this could represent
clear lingula or gas within the gastric fundus, recommend follow-
up/repeat portable chest radiograph following advancement of
nasogastric tube and aspiration of stomach, in order is to
determine whether gas collection represents stomach or potentially
pneumothorax.
Diffuse pulmonary edema with remaining heights and tubes as above.

Critical test results telephoned to patient's nurse Reboux RN on
[phone] at the time of interpretation on 07/17/2008 at 6518.

## 2010-02-15 IMAGING — CR DG CHEST 1V PORT
1 series · 1 of 1 positions shown · non-contrast
Comparison: Repeat portable exam 3232 hours compared to 3636 hours.

CLINICAL DATA: Reevaluated left lung, questionable basilar
pneumothorax, repositioning of nasogastric tube

PORTABLE CHEST - 1 VIEW

[view not recorded]
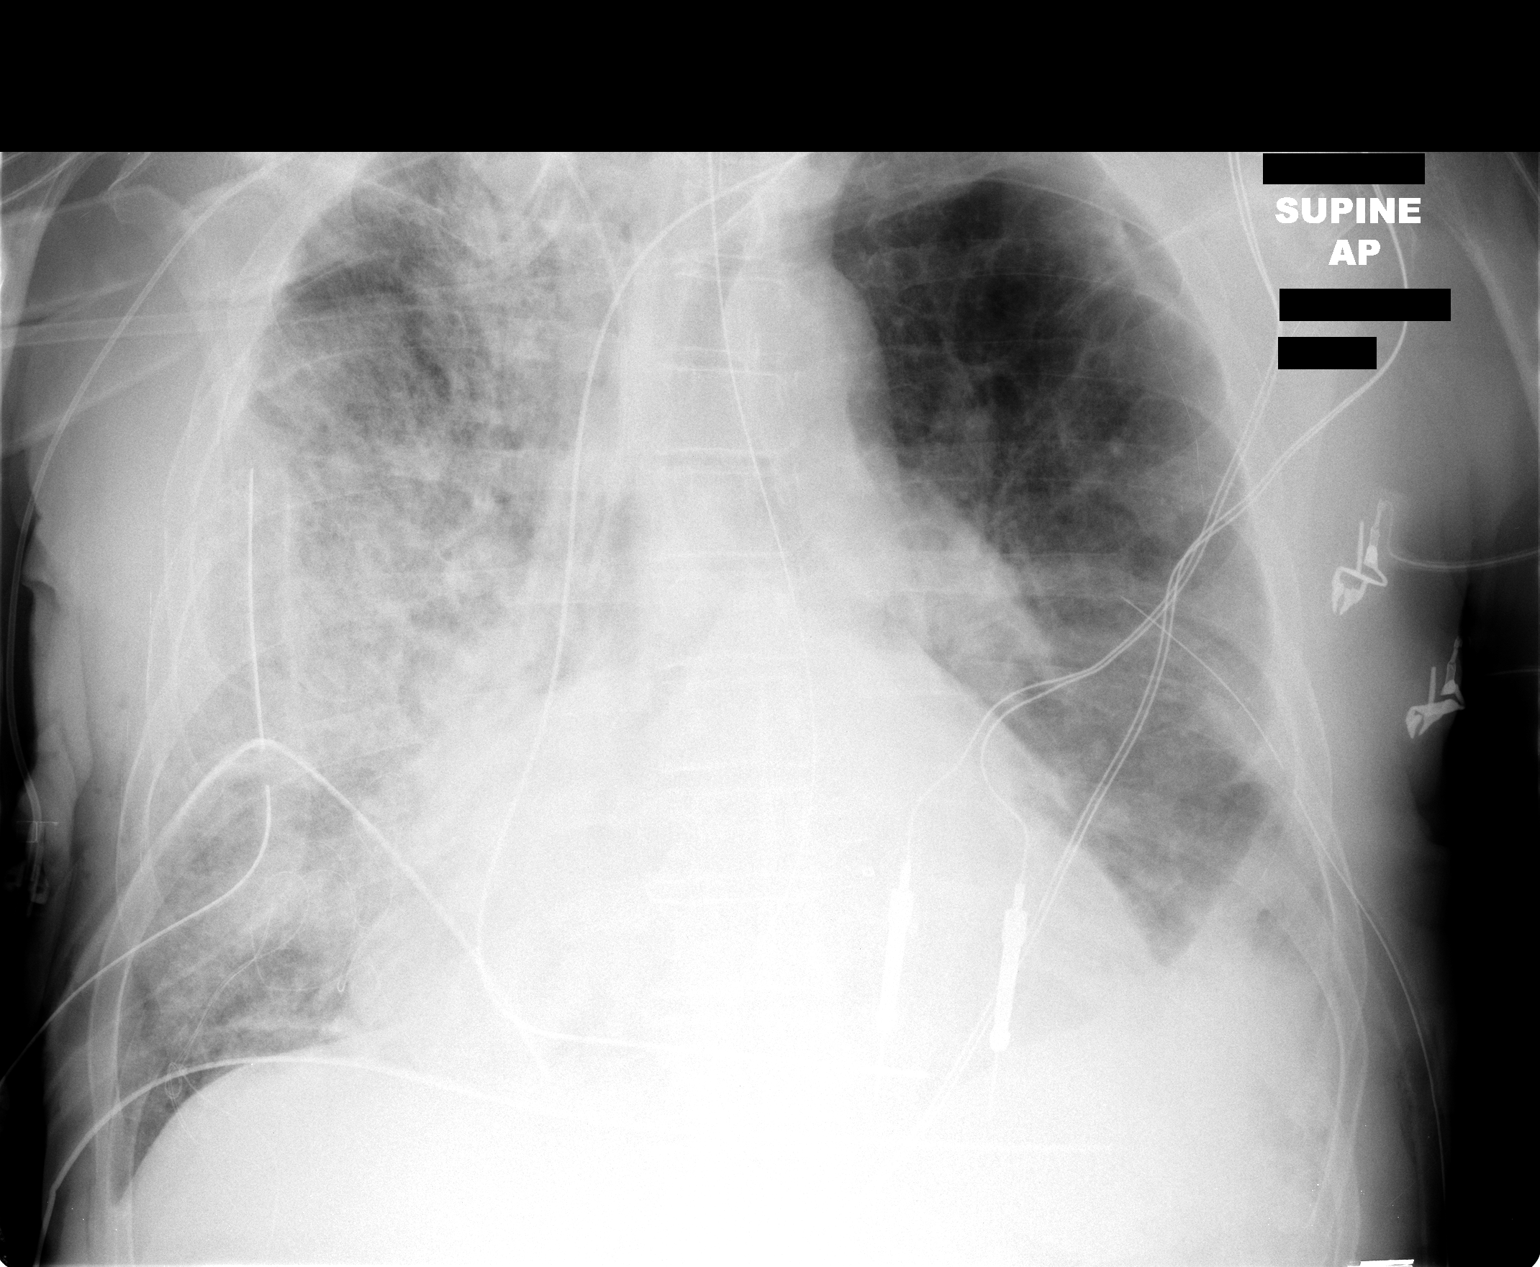

[1 of 1 positions shown; findings below may reference images not displayed]

FINDINGS: Tip of endotracheal tube 10.2 cm above carina.
Nasogastric tube extends into stomach.
Left subclavian pain pacemaker lead stable.
Right arm PICC line, and bilateral thoracostomy tubes unchanged.
Resolution of gas collection seen previously at left lung base.
Tiny pneumothorax at lateral aspect of mid left chest.
Severe diffuse infiltrate throughout right lung, minimally in left
lung, with increased left basilar atelectasis and effusion.
Epicardial pacing wires project over lower right chest.
IMPRESSION: Pulmonary edema, asymmetrically greater on right.
Resolution of gas collection seen previously at left lung base/left
upper quadrant.
Increased left basilar atelectasis and effusion.
Tiny pneumothorax lateral aspect mid left chest.

## 2010-02-16 IMAGING — CR DG CHEST 1V PORT
1 series · 2 of 2 positions shown · non-contrast
Comparison: 07/17/2008

CLINICAL DATA: Endocarditis and sepsis.

PORTABLE CHEST - 1 VIEW

[Series 1: AP · 0.16mm/px · 2 of 2 slices shown]
[im 1/2]
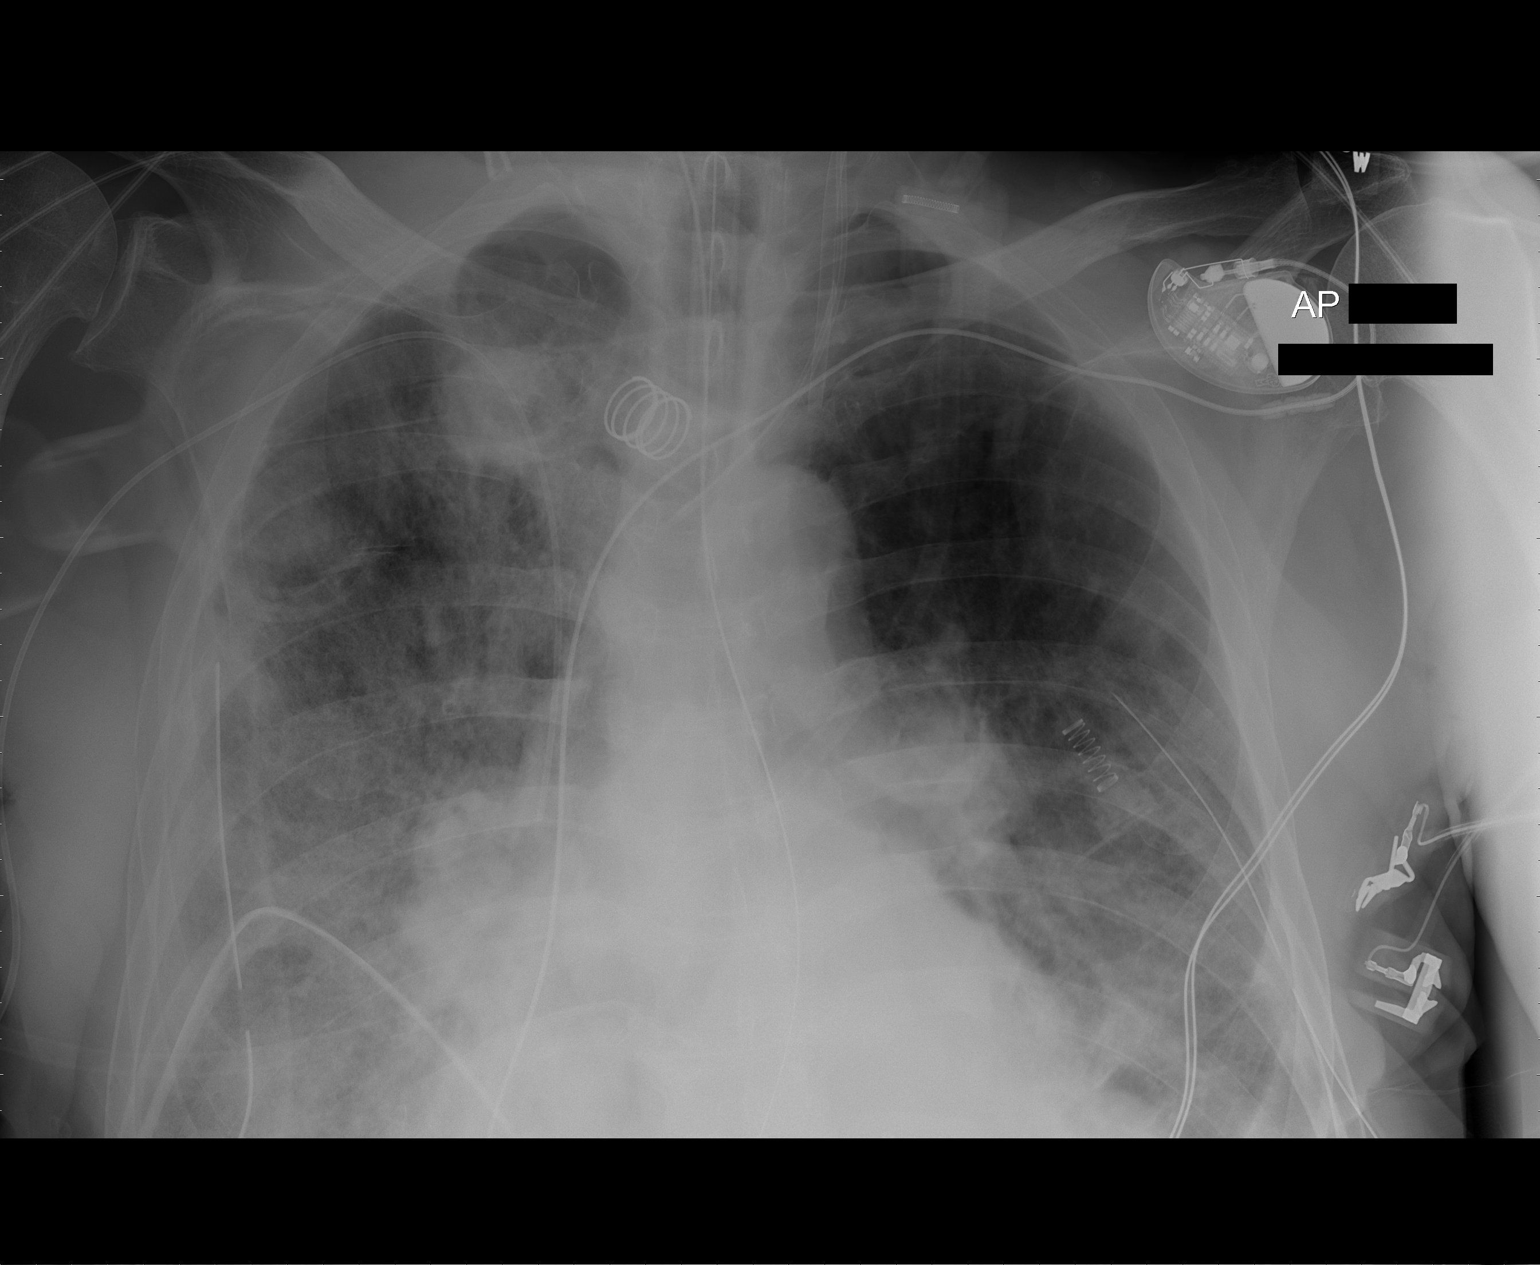
[im 2/2]
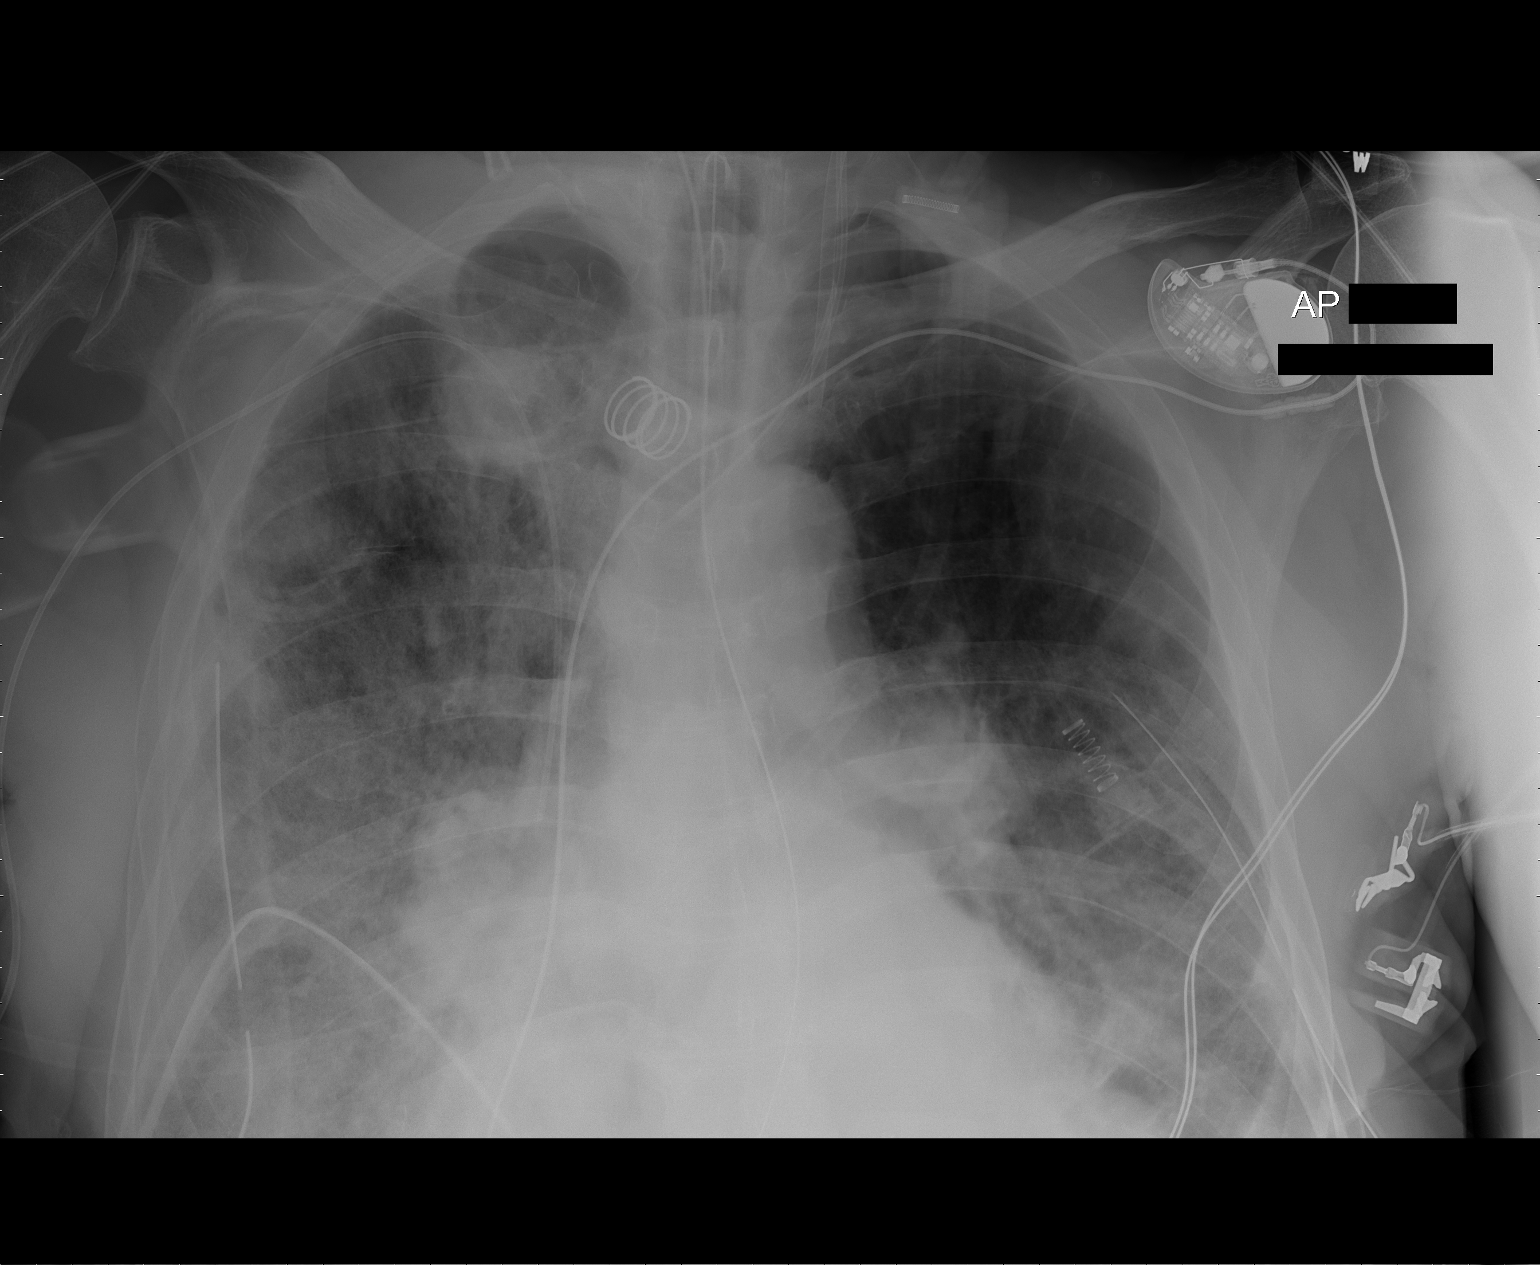

[2 of 2 positions shown; findings below may reference images not displayed]

FINDINGS: The tip of the endotracheal tube is 7 cm above the
carina.  There is a left internal jugular sheath and a separate
left internal jugular central venous catheter; the catheter
projects over the brachiocephalic vein.

The support apparatus is otherwise stable, with bilateral chest
tubes in place.  No definite pneumothorax is currently identified.
However, please note that the lung bases were inadvertently
excluded.

There is continued atelectasis at the left lung base and
considerable right-sided airspace opacity.  Underlying COPD is
suspected.  The degree of airspace opacity on the right side is
stable.
IMPRESSION: 1.  Similar degree of bilateral airspace and interstitial
opacities.
2.  No definite pneumothorax is currently identified.
3.  Left IJ catheter tip projects over the brachiocephalic vein.
4.  The endotracheal tube appears satisfactorily positioned

## 2010-02-17 IMAGING — CR DG CHEST 1V PORT
1 series · 1 of 1 positions shown · non-contrast
Comparison: 07/18/2008

CLINICAL DATA: History of endocarditis and sepsis.

PORTABLE CHEST - 1 VIEW

[view not recorded]
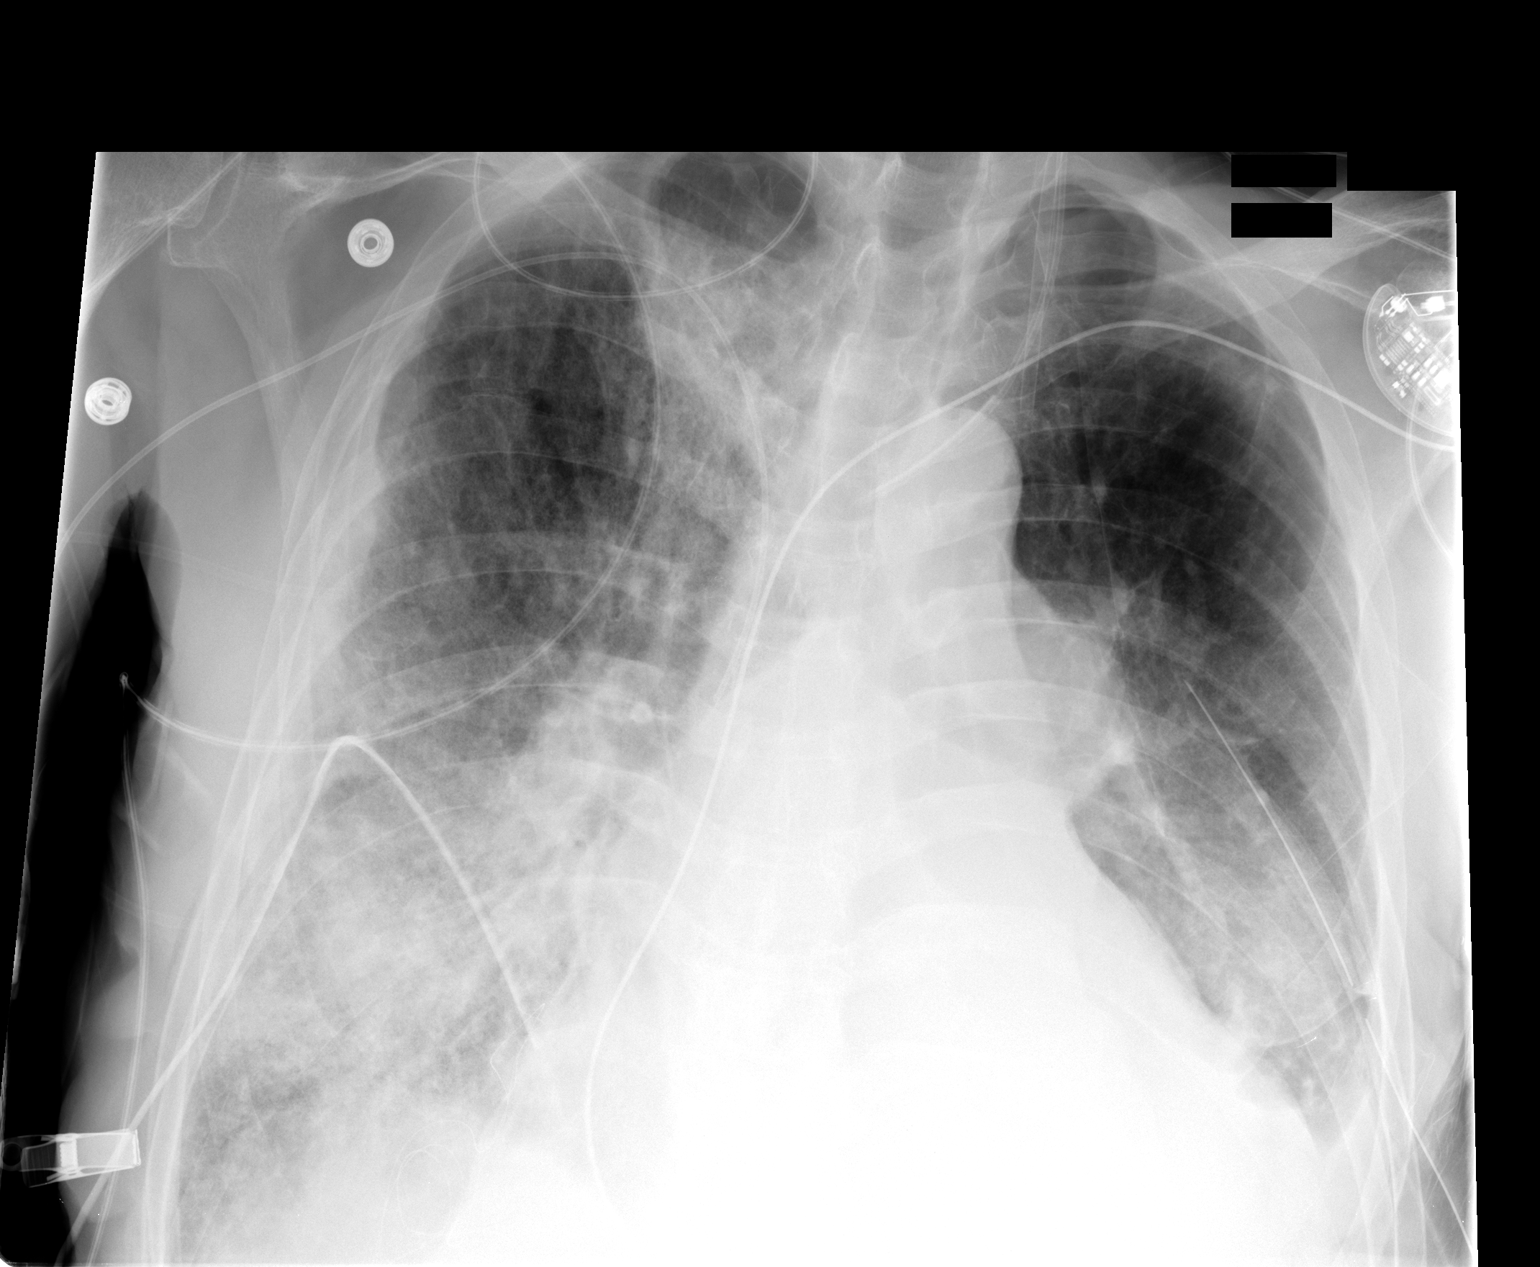

[1 of 1 positions shown; findings below may reference images not displayed]

FINDINGS: Portable view of the chest demonstrates removal of the
endotracheal tube and nasogastric tube.  The patient continues to
have bilateral chest tubes.  One of the right chest tubes has been
removed.  Diffuse interstitial densities throughout the right lung
have not significantly changed.  There is slightly increased
opacification or consolidation in the right lung base.  Heart size
remains upper limits of normal.  Suggestion for left pleural fluid.
The patient has a two left jugular central lines which terminate in
the left innominate vein region.  There is also a right PICC line
which terminates in the SVC .  Persistent opacification in the
medial right upper lung near the right clavicular head.
IMPRESSION: Minimal change in the lung fields.

Removal of support apparatuses as described.  No evidence for a
large pneumothorax.

## 2010-02-18 IMAGING — CR DG CHEST 1V PORT
1 series · 1 of 1 positions shown · non-contrast
Comparison: 07/19/2008

CLINICAL DATA: Endocarditis.  Sepsis.  Follow-up.

PORTABLE CHEST - 1 VIEW

[view not recorded]
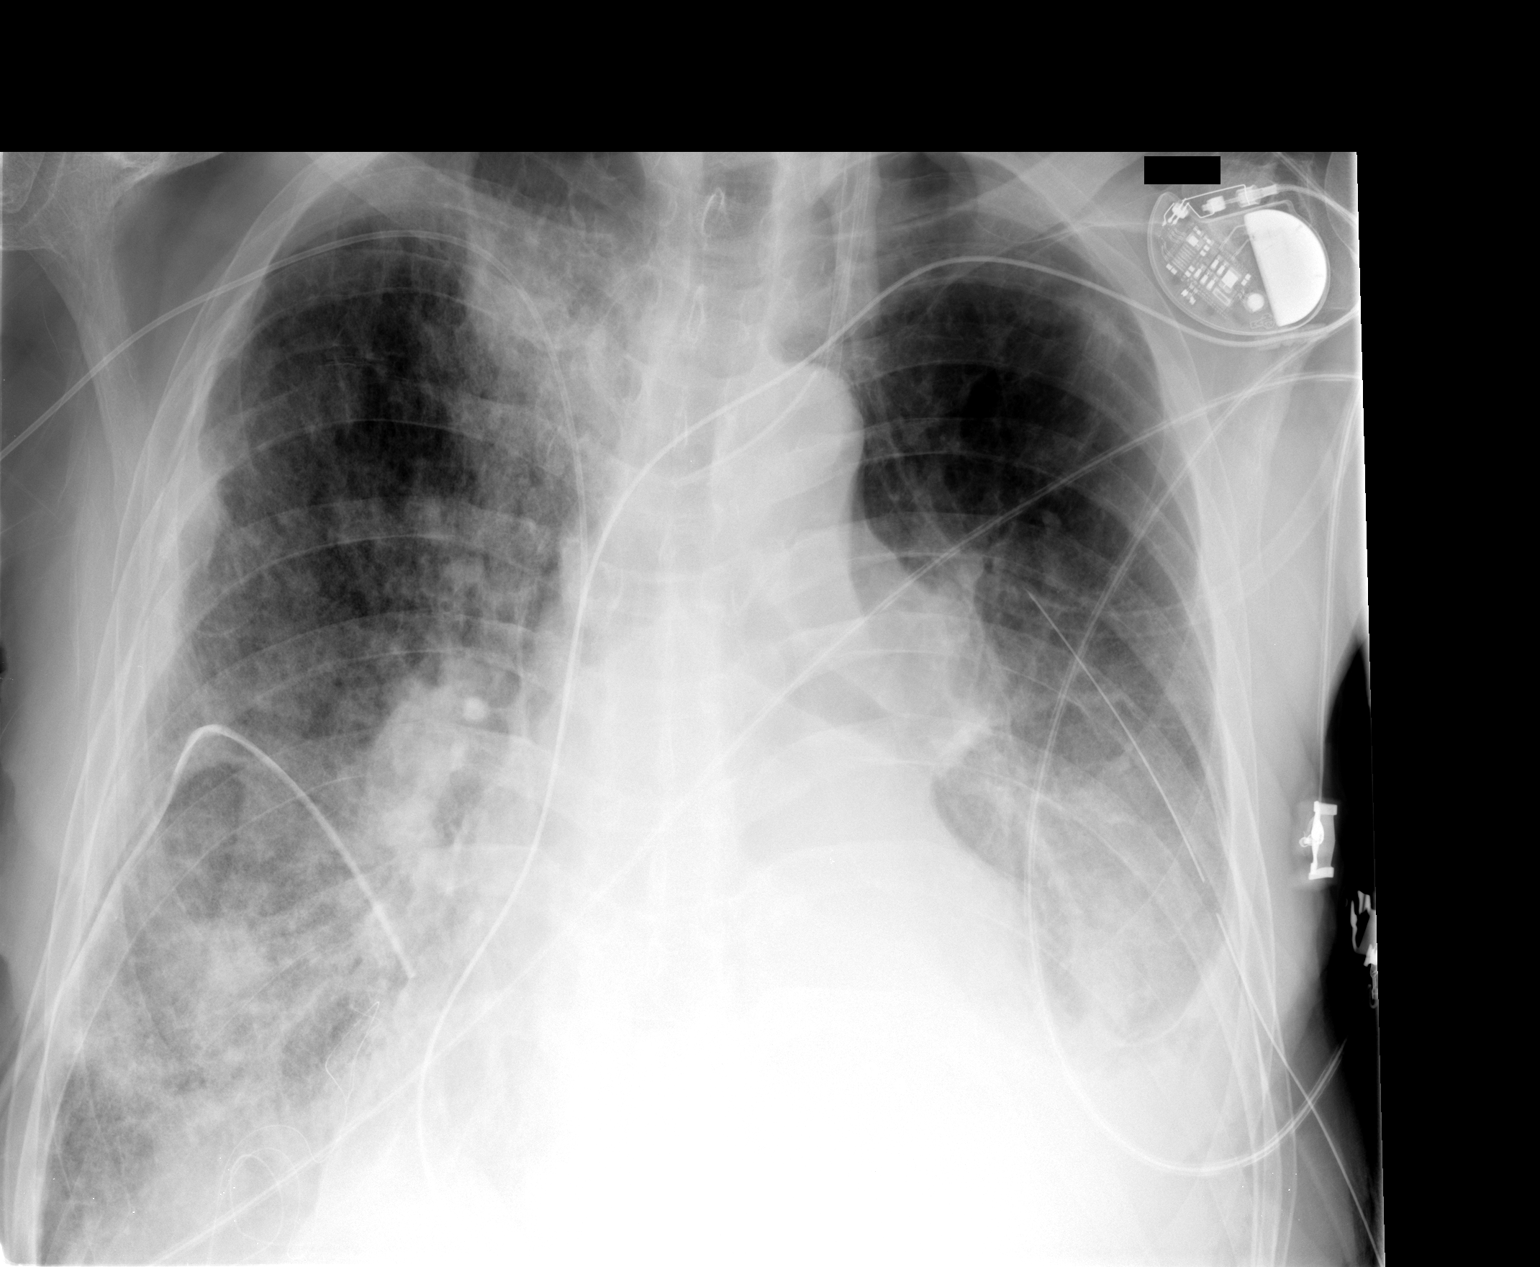

[1 of 1 positions shown; findings below may reference images not displayed]

FINDINGS: Patient has left-sided transvenous pacemaker with lead
off the film.  Right PICC line, left internal jugular line and
bilateral chest tubes are in place as before.

Cardiac, pericardial silhouette is enlarged.  The lung bases are
excluded from this film.  Patchy parenchymal infiltrates are again
noted.  Bilateral loculated pleural effusions or pleural thickening
appears stable.
IMPRESSION: Overall, little interval change since prior study.

## 2010-02-19 IMAGING — CR DG CHEST 1V PORT
1 series · 1 of 1 positions shown · non-contrast
Comparison: 07/20/2008

CLINICAL DATA: Postop endocarditis, sepsis.

PORTABLE CHEST - 1 VIEW

[view not recorded]
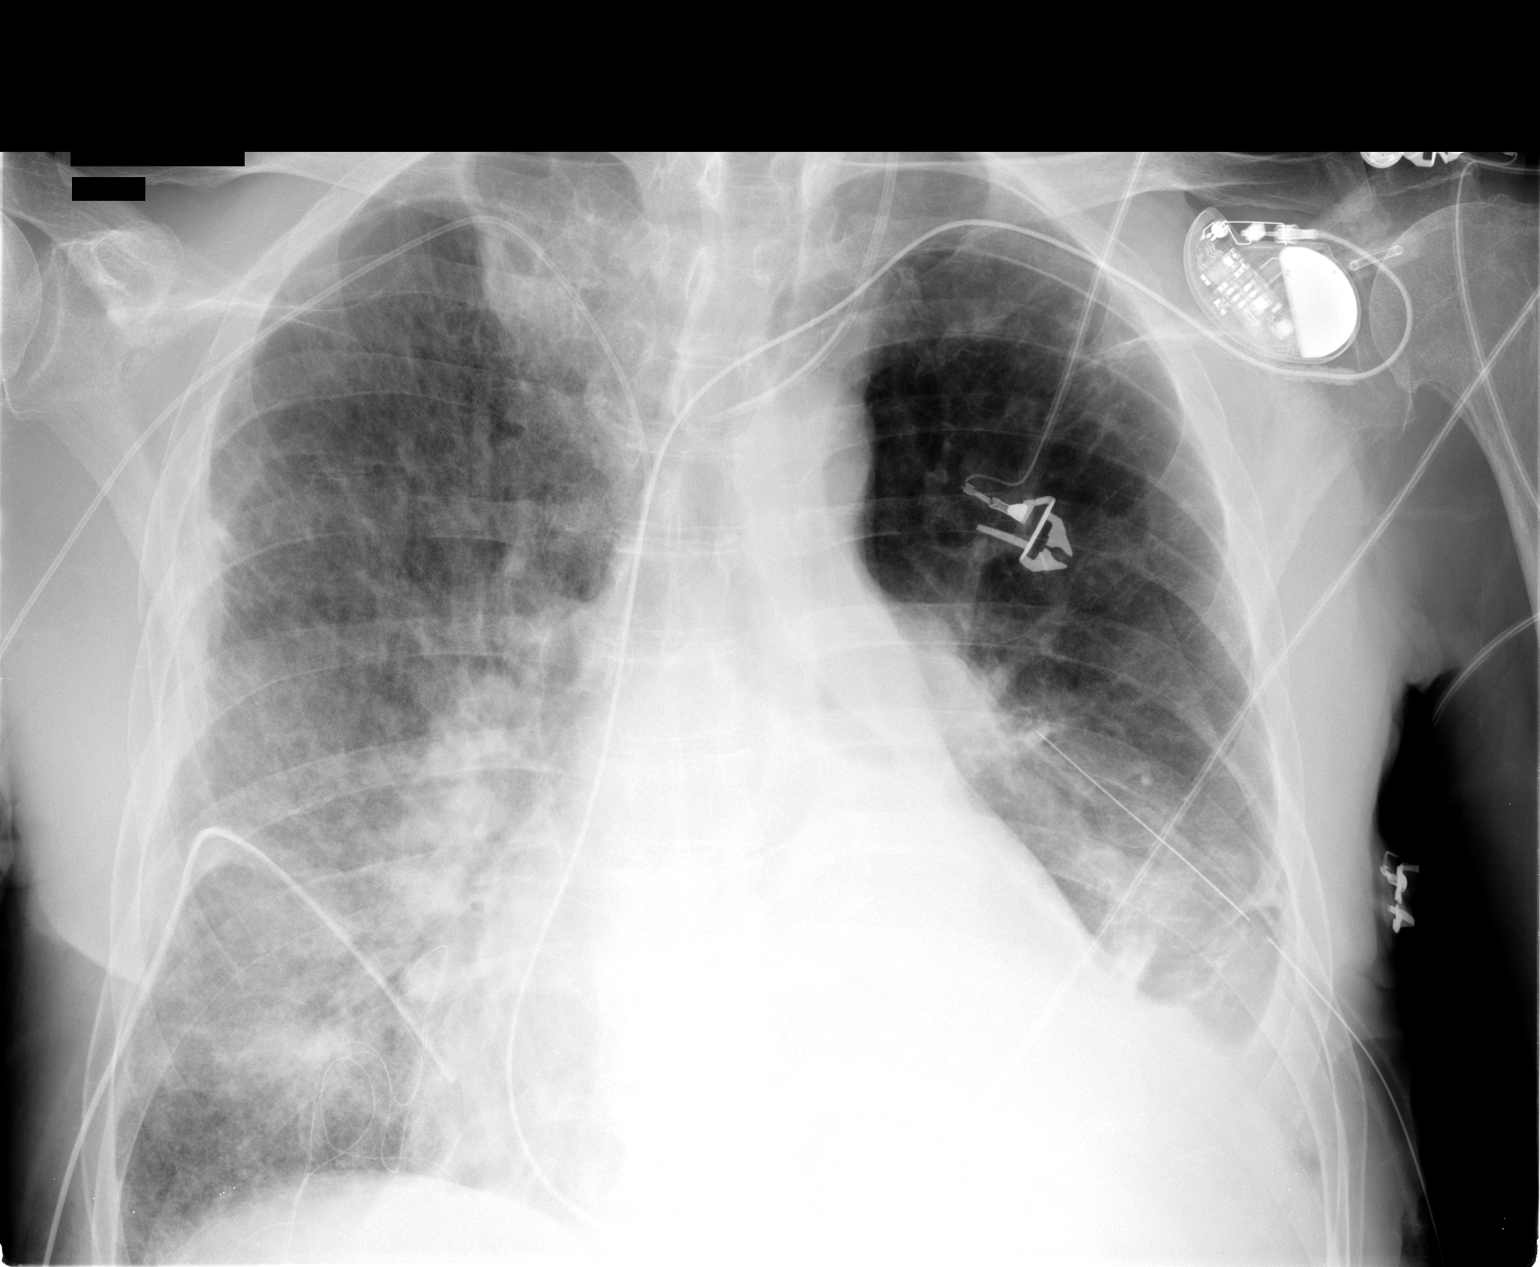

[1 of 1 positions shown; findings below may reference images not displayed]

FINDINGS: Trachea is midline.  Heart is enlarged, stable.  Support
apparatus is unchanged.  Left pleural effusion and mixed
interstitial and airspace disease persist.  There is left lower
lobe consolidation.
IMPRESSION: Left pleural effusion and bilateral mixed interstitial airspace
disease.  Left lower lobe consolidation.

## 2010-02-20 IMAGING — CR DG CHEST 1V PORT
1 series · 1 of 1 positions shown · non-contrast
Comparison: 07/21/2008

CLINICAL DATA: Status post PICC line insertion

PORTABLE CHEST - 1 VIEW

[view not recorded]
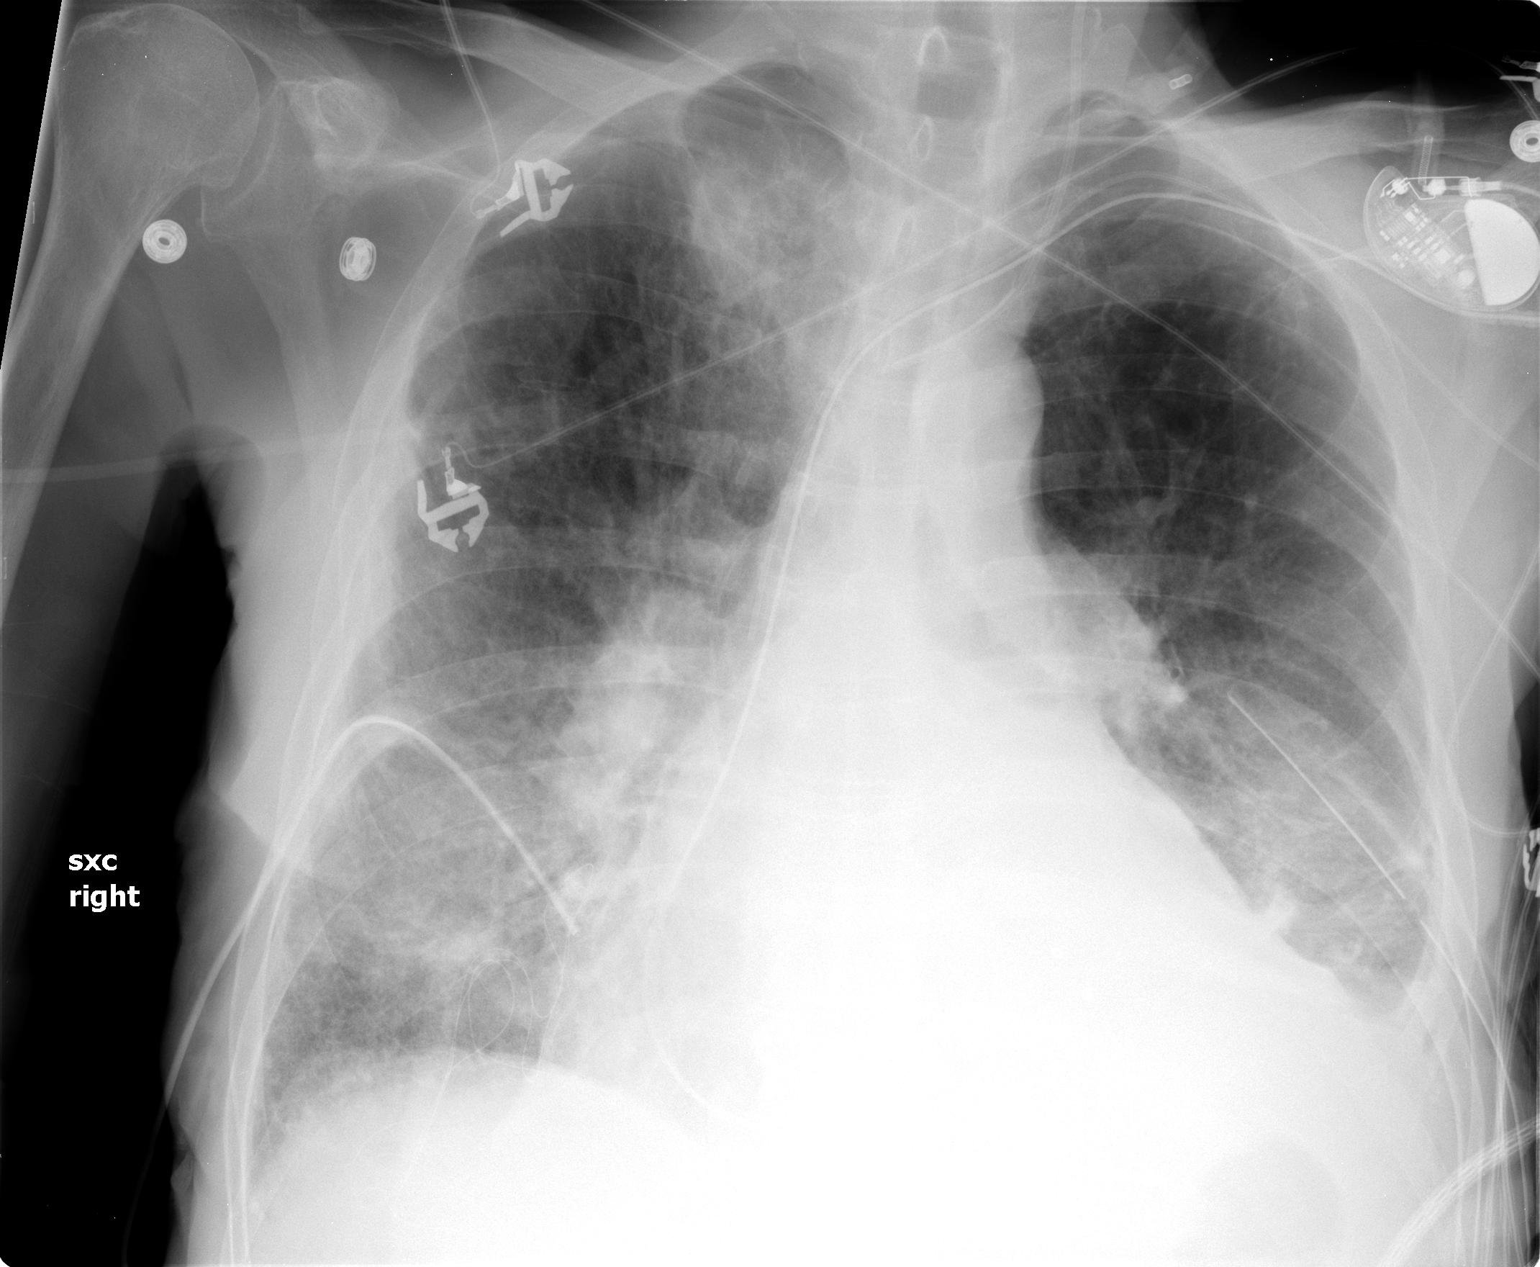

[1 of 1 positions shown; findings below may reference images not displayed]

FINDINGS: The left arm PICC line tip is in the SVC.

There is a left chest wall pacer device with lead in the right
ventricle.

The patient has bilateral chest tubes.  There is no visible
pneumothorax.

There is a left IJ catheter with tip in the innominate vein.

There is a left pleural effusion which is unchanged from prior
exam.

Moderate interstitial edema pattern is also unchanged.

Left lower lobe consolidation persists.
IMPRESSION: 1.  The PICC line tip is in the SVC.
2.  No change in aeration to the lungs.

## 2010-02-22 IMAGING — CR DG CHEST 1V PORT
1 series · 1 of 1 positions shown · non-contrast
Comparison: 07/24/2008 at [DATE] a.m.

CLINICAL DATA: Left pneumothorax.  Chest tube removal.

PORTABLE CHEST - 1 VIEW [DATE] p.m.

[view not recorded]
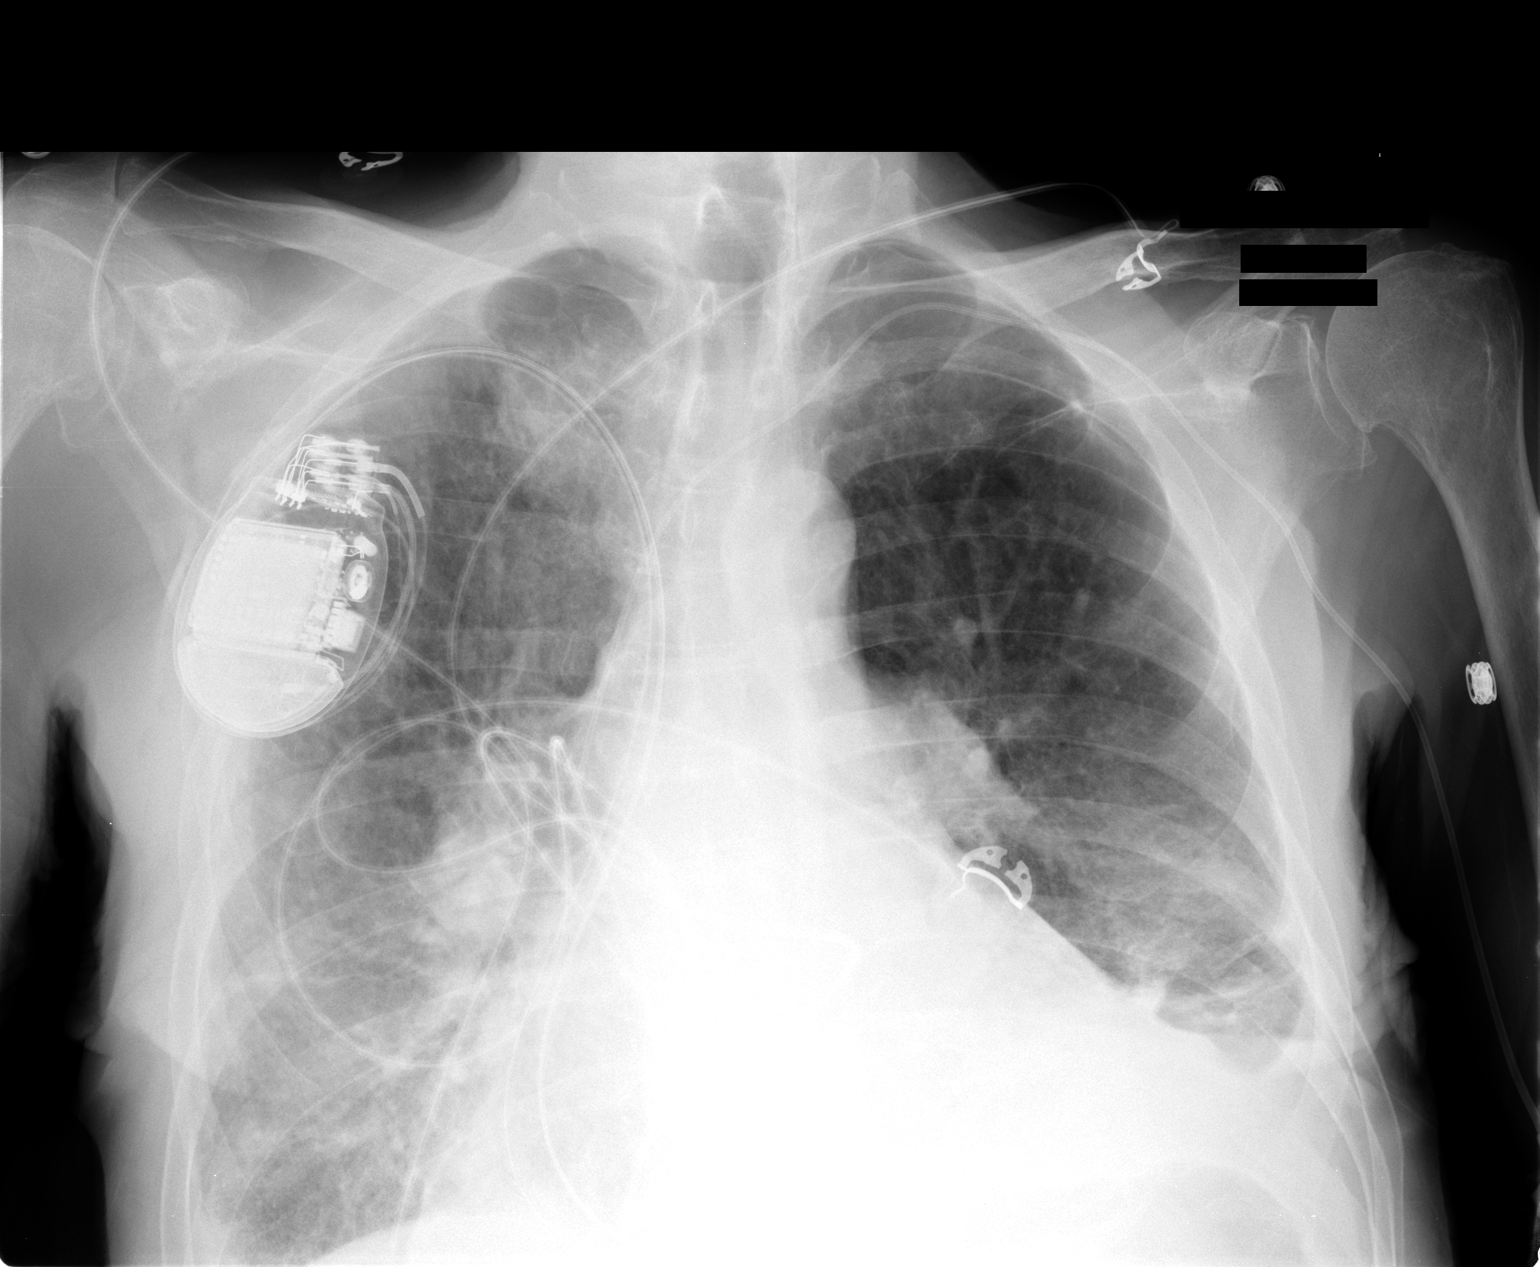

[1 of 1 positions shown; findings below may reference images not displayed]

FINDINGS: Left-sided and right sided chest tubes have been removed.
There is a tiny crescent of  air adjacent to the apex of the left
lung with no significant pneumothorax. No right pneumothorax.
There is slight increased atelectasis at the bases.  Left-sided
PICC line remains in good position.  Dual lead pacer appears
unchanged.
IMPRESSION: No significant pneumothorax after chest tube removal.  Slight
increased atelectasis at the bases.

## 2010-02-22 IMAGING — CR DG CHEST 2V
2 series · 2 of 2 positions shown · non-contrast
Comparison: 07/22/2008

CLINICAL DATA: Post pacemaker placement.  Bilateral chest tubes.
Endocarditis.

CHEST - 2 VIEW

[w chest pa]
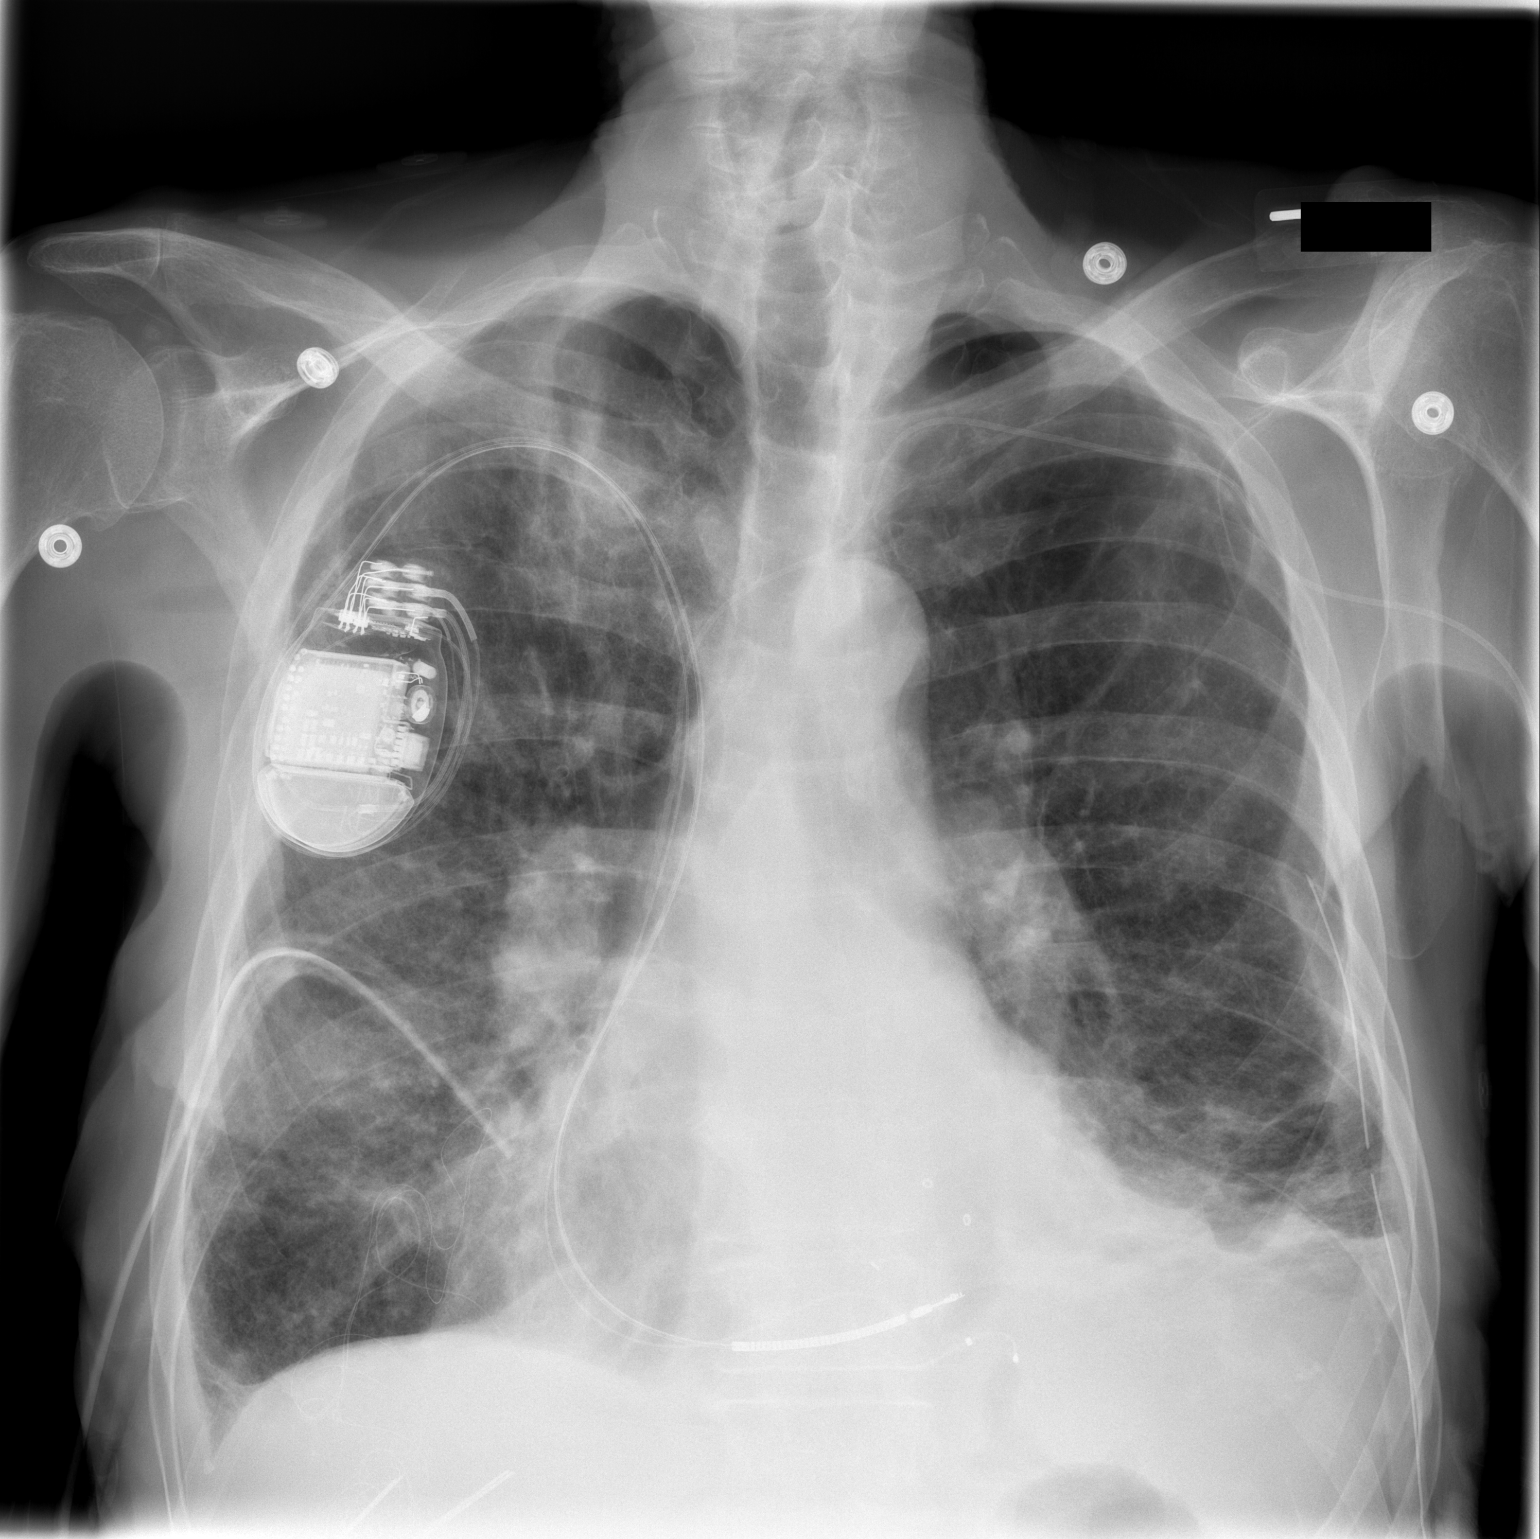

[w chest lat]
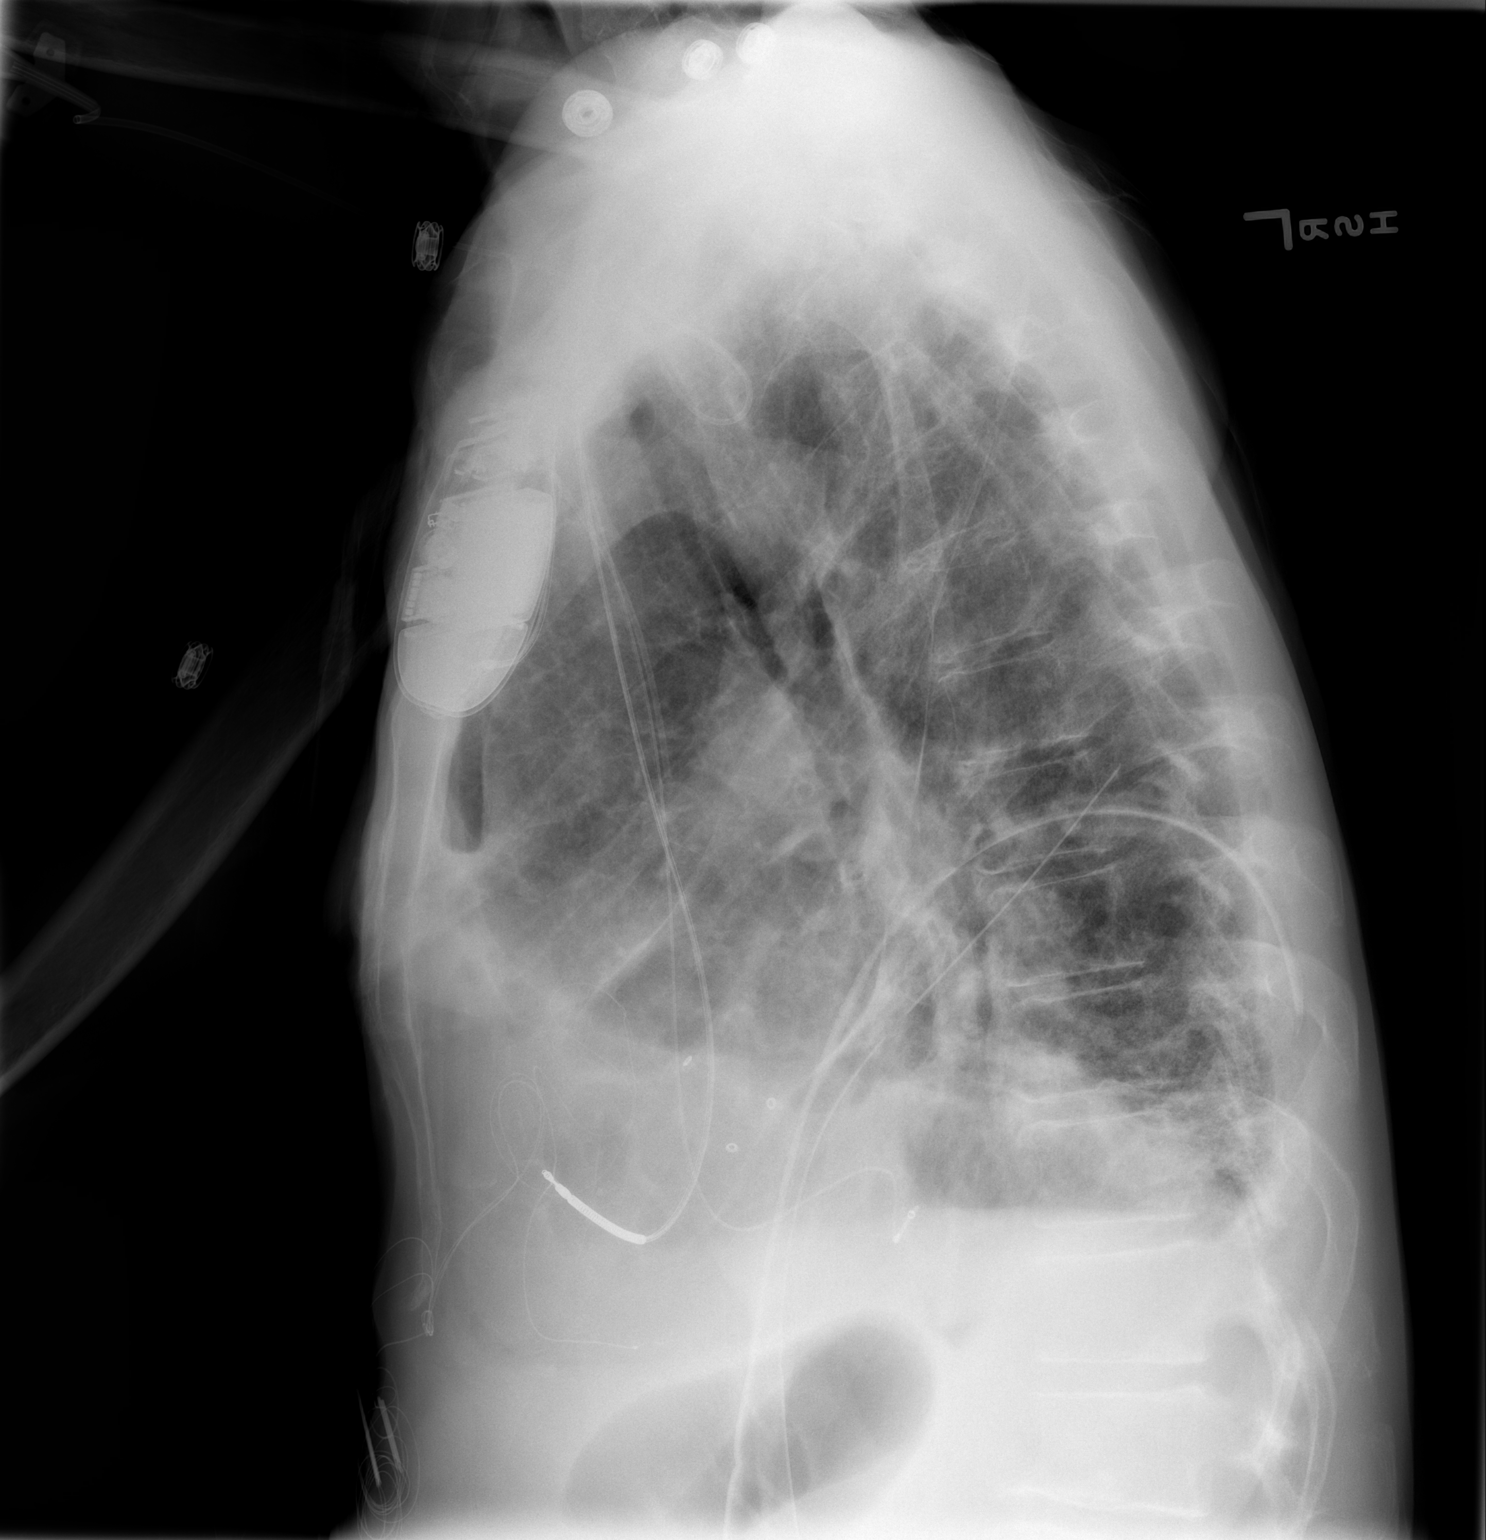

[2 of 2 positions shown; findings below may reference images not displayed]

FINDINGS: 7023 hours.  Placement of a pacer / AICD device.  Leads
terminate over the right ventricle and coronary sinus.  There is no
lead discontinuity.  Left-sided PICC line is unchanged.

Right-sided chest tube remains in place.  A left-sided chest tube
terminates more laterally and may been repositioned.

Small left apical pneumothorax.  Approximately 5%.  Apparent
anteriorly on lateral view as well.

Small left greater right pleural effusions are similar.  Normal
heart size.  Slight improvement in mild interstitial edema.
Decreased bibasilar airspace disease.
IMPRESSION: 1.  Bilateral chest tubes in place with development of a small left
apical pneumothorax. I called and personally discussed this report
with Tarla, rn at [DATE] a.m. on 07/24/2008.
2.  Interval placement of a dual lead pacer / AICD device.
3.  Slight improvement in congestive failure with persistent
bibasilar airspace disease and pleural effusions.

## 2010-02-23 IMAGING — CR DG CHEST 2V
2 series · 2 of 2 positions shown · non-contrast
Comparison: 07/24/2008

CLINICAL DATA: Endocarditis.  Sepsis.

CHEST - 2 VIEW

[w chest pa]
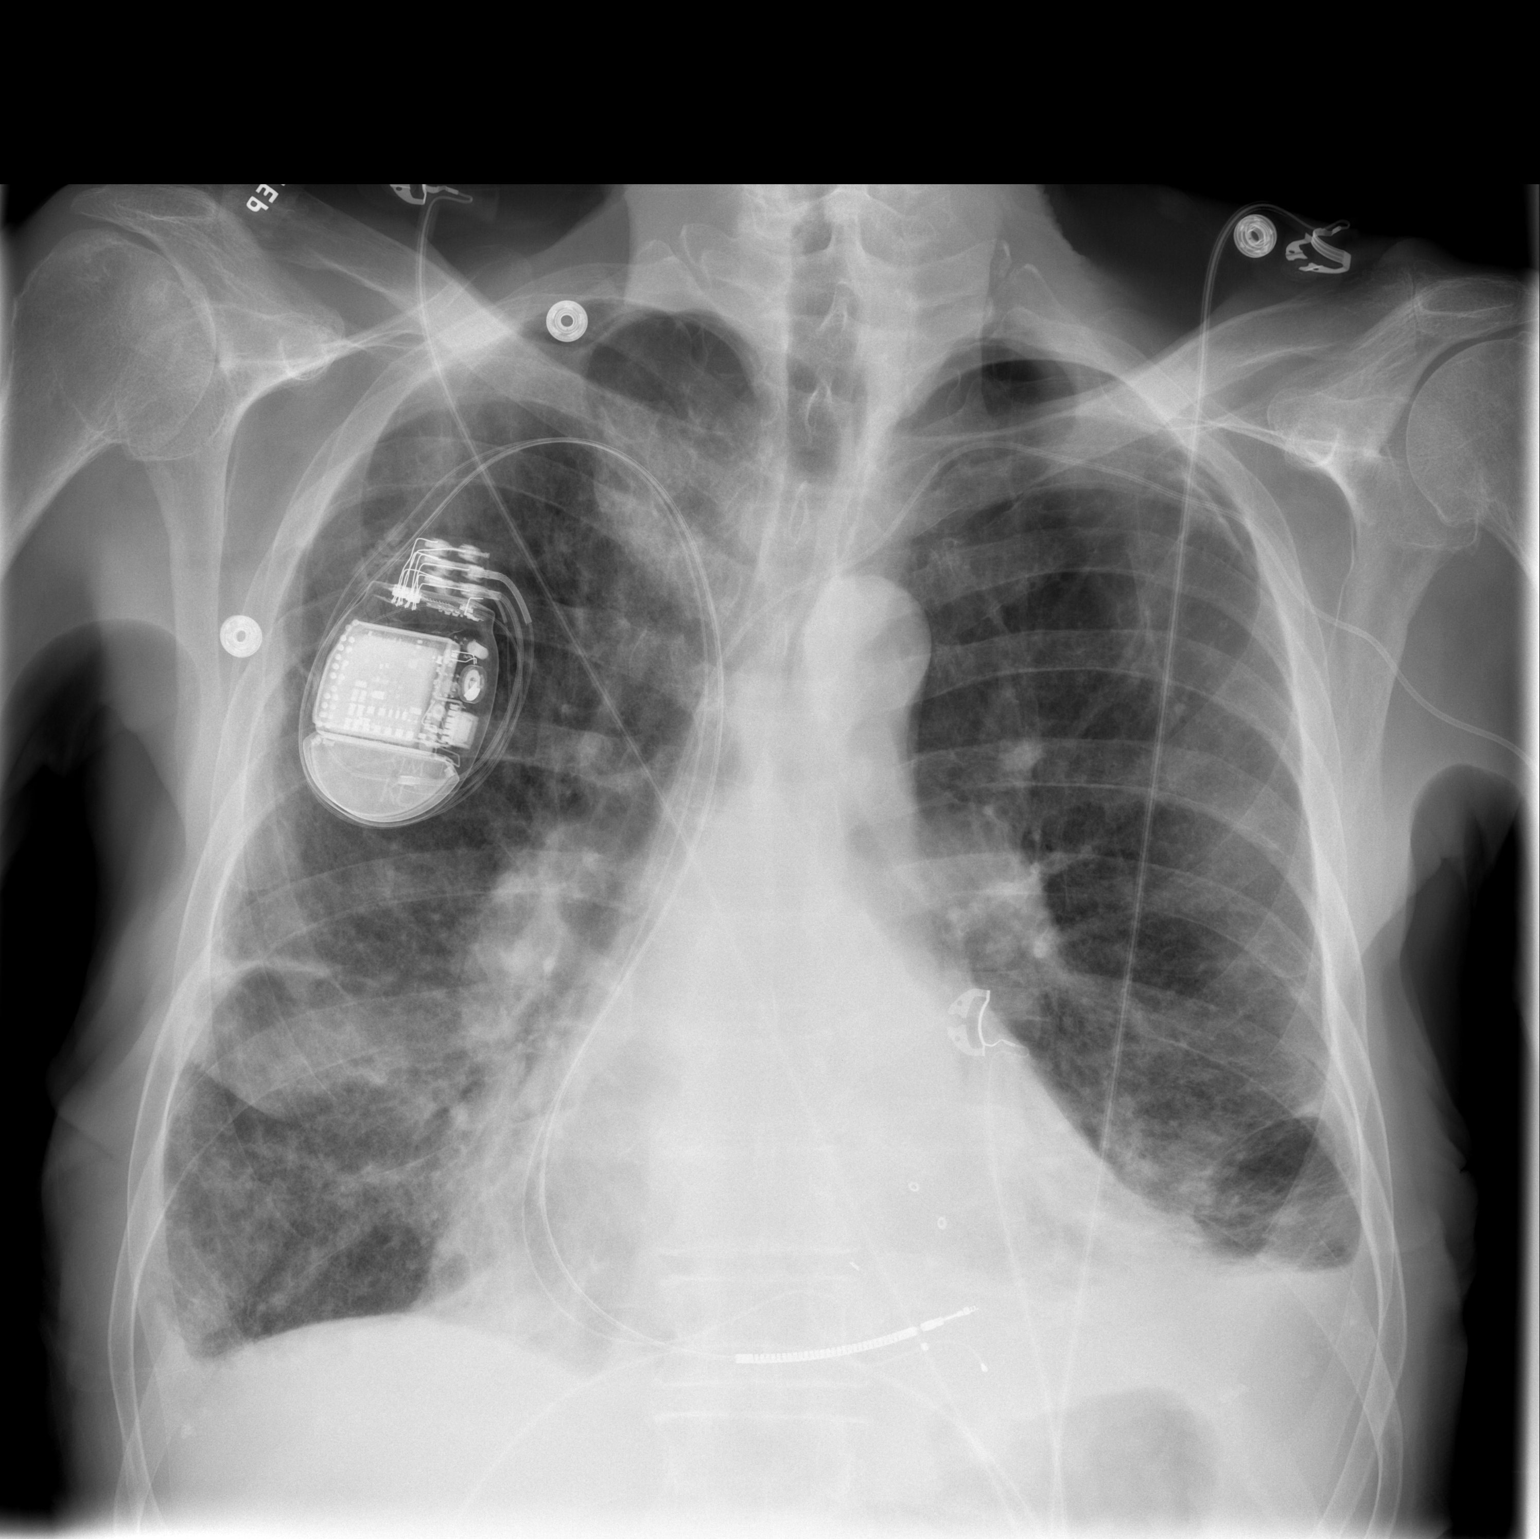

[w chest lat]
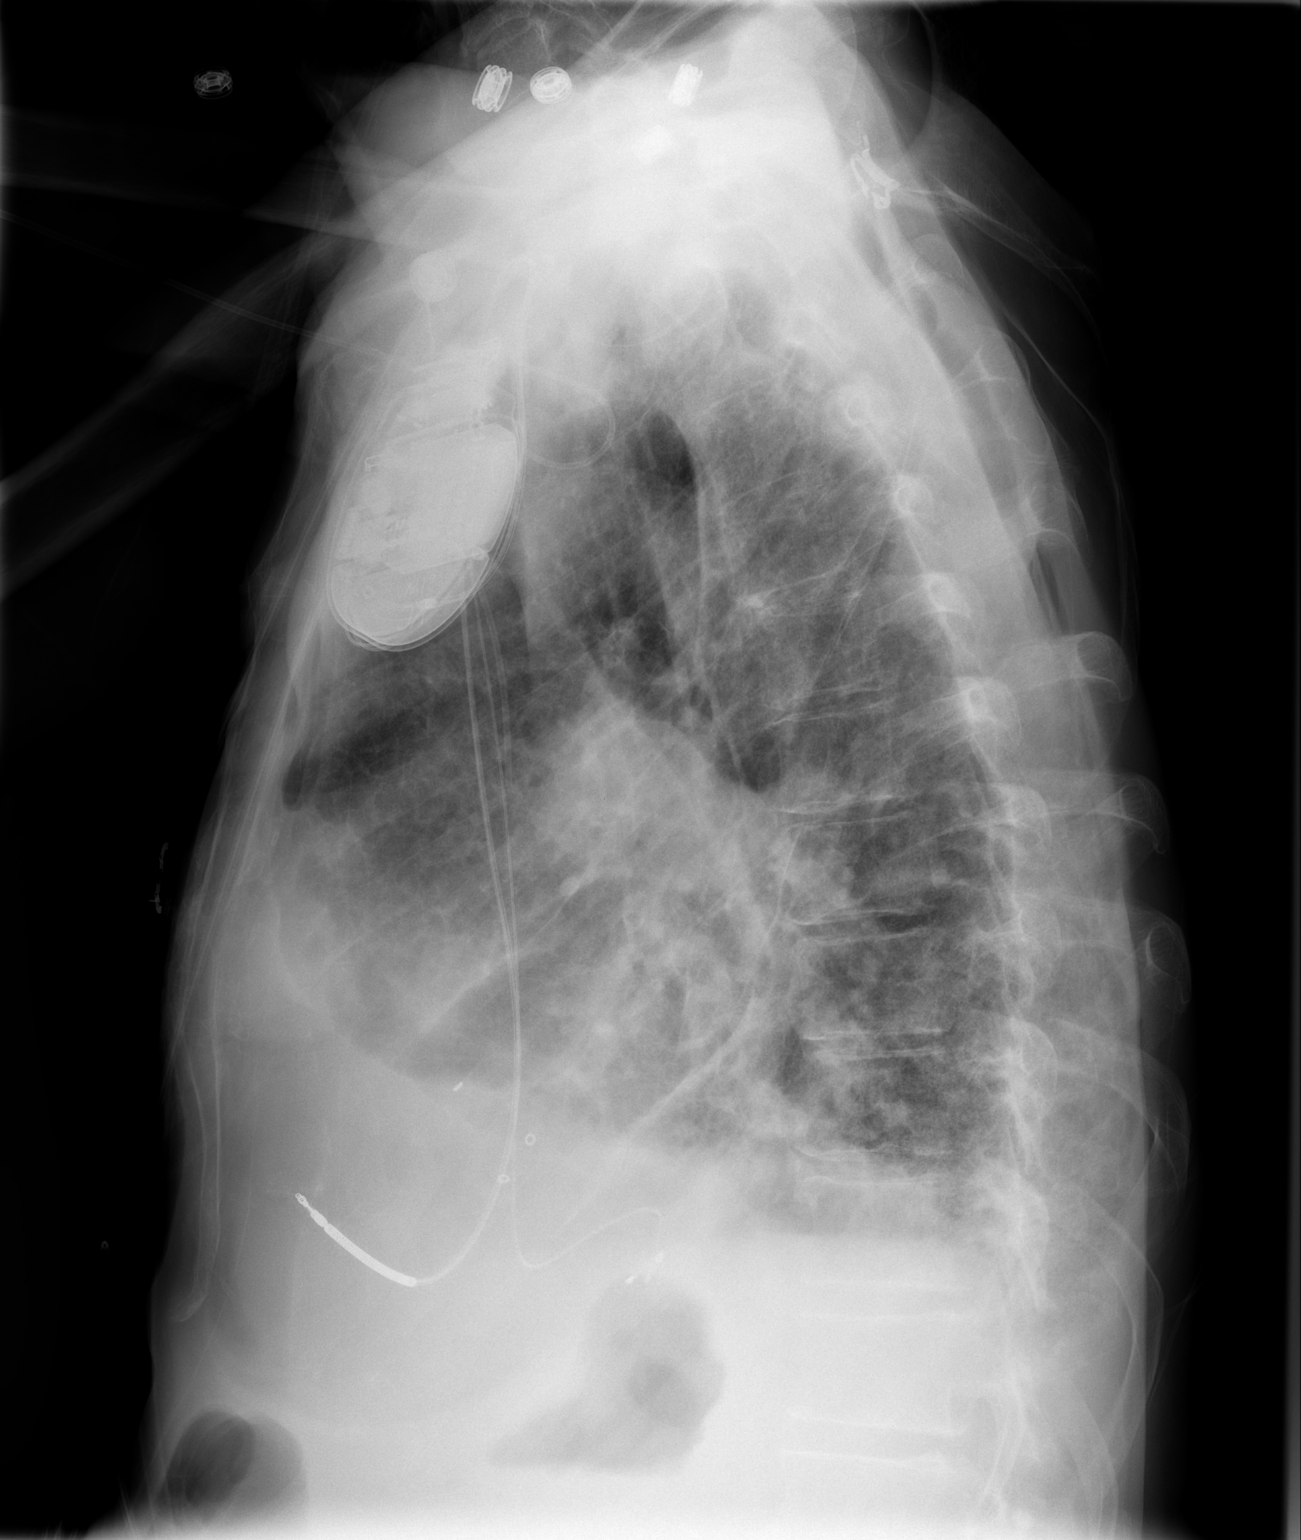

[2 of 2 positions shown; findings below may reference images not displayed]

FINDINGS: Small pleural effusions.  Cardiomegaly.  Minimal
atelectasis at the lung bases.  Overall slight improved lung
aeration. PICC line is in the SVC.  Dual lead pacer appears
unchanged.
IMPRESSION: Slight improved aeration of the lungs overall.  Mild atelectasis
and small pleural effusions.

## 2010-03-05 IMAGING — CR DG CHEST 2V
2 series · 2 of 2 positions shown · non-contrast
Comparison: 07/25/2008

CLINICAL DATA: Mitral valve replacement with cough.

CHEST - 2 VIEW

[w chest pa]
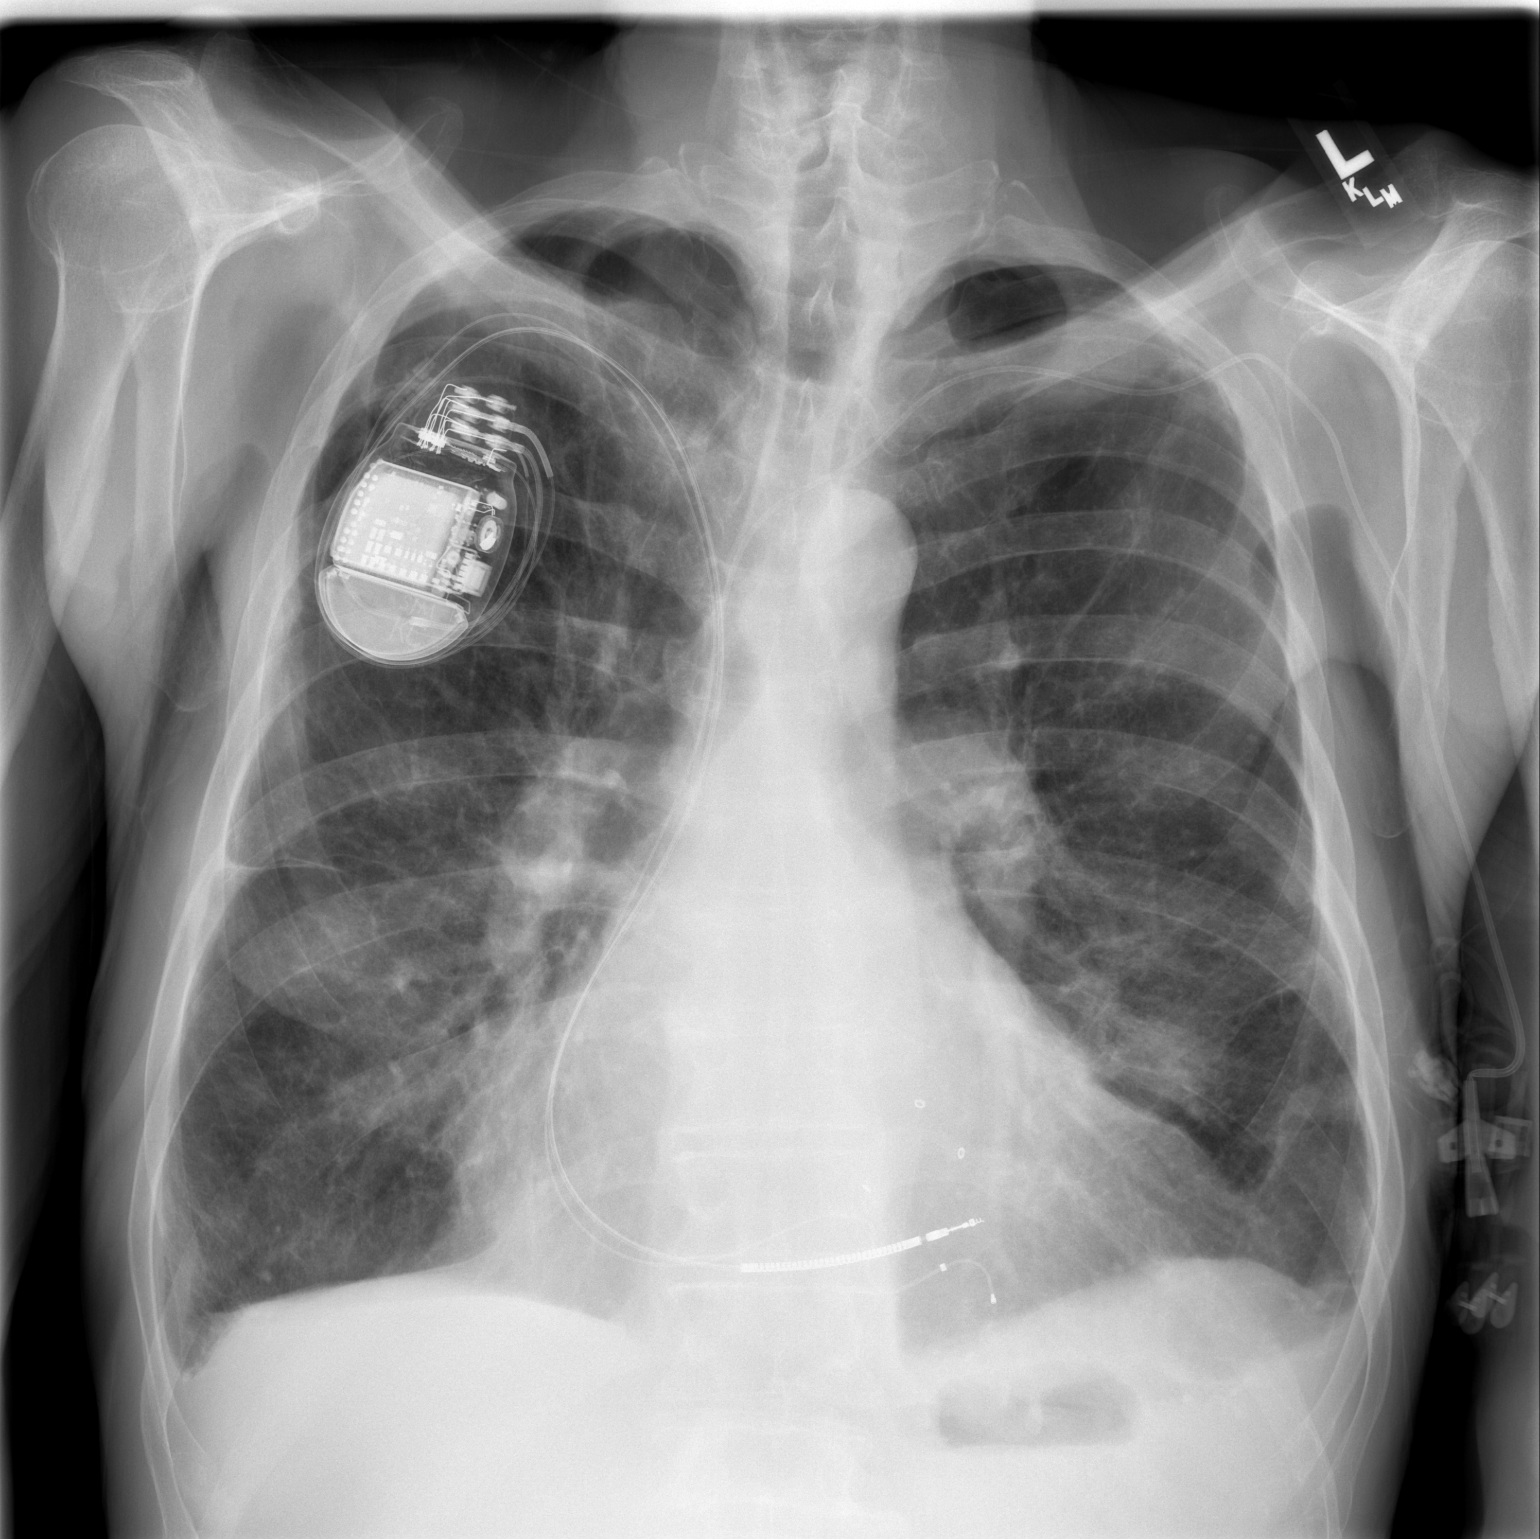

[w chest lat]
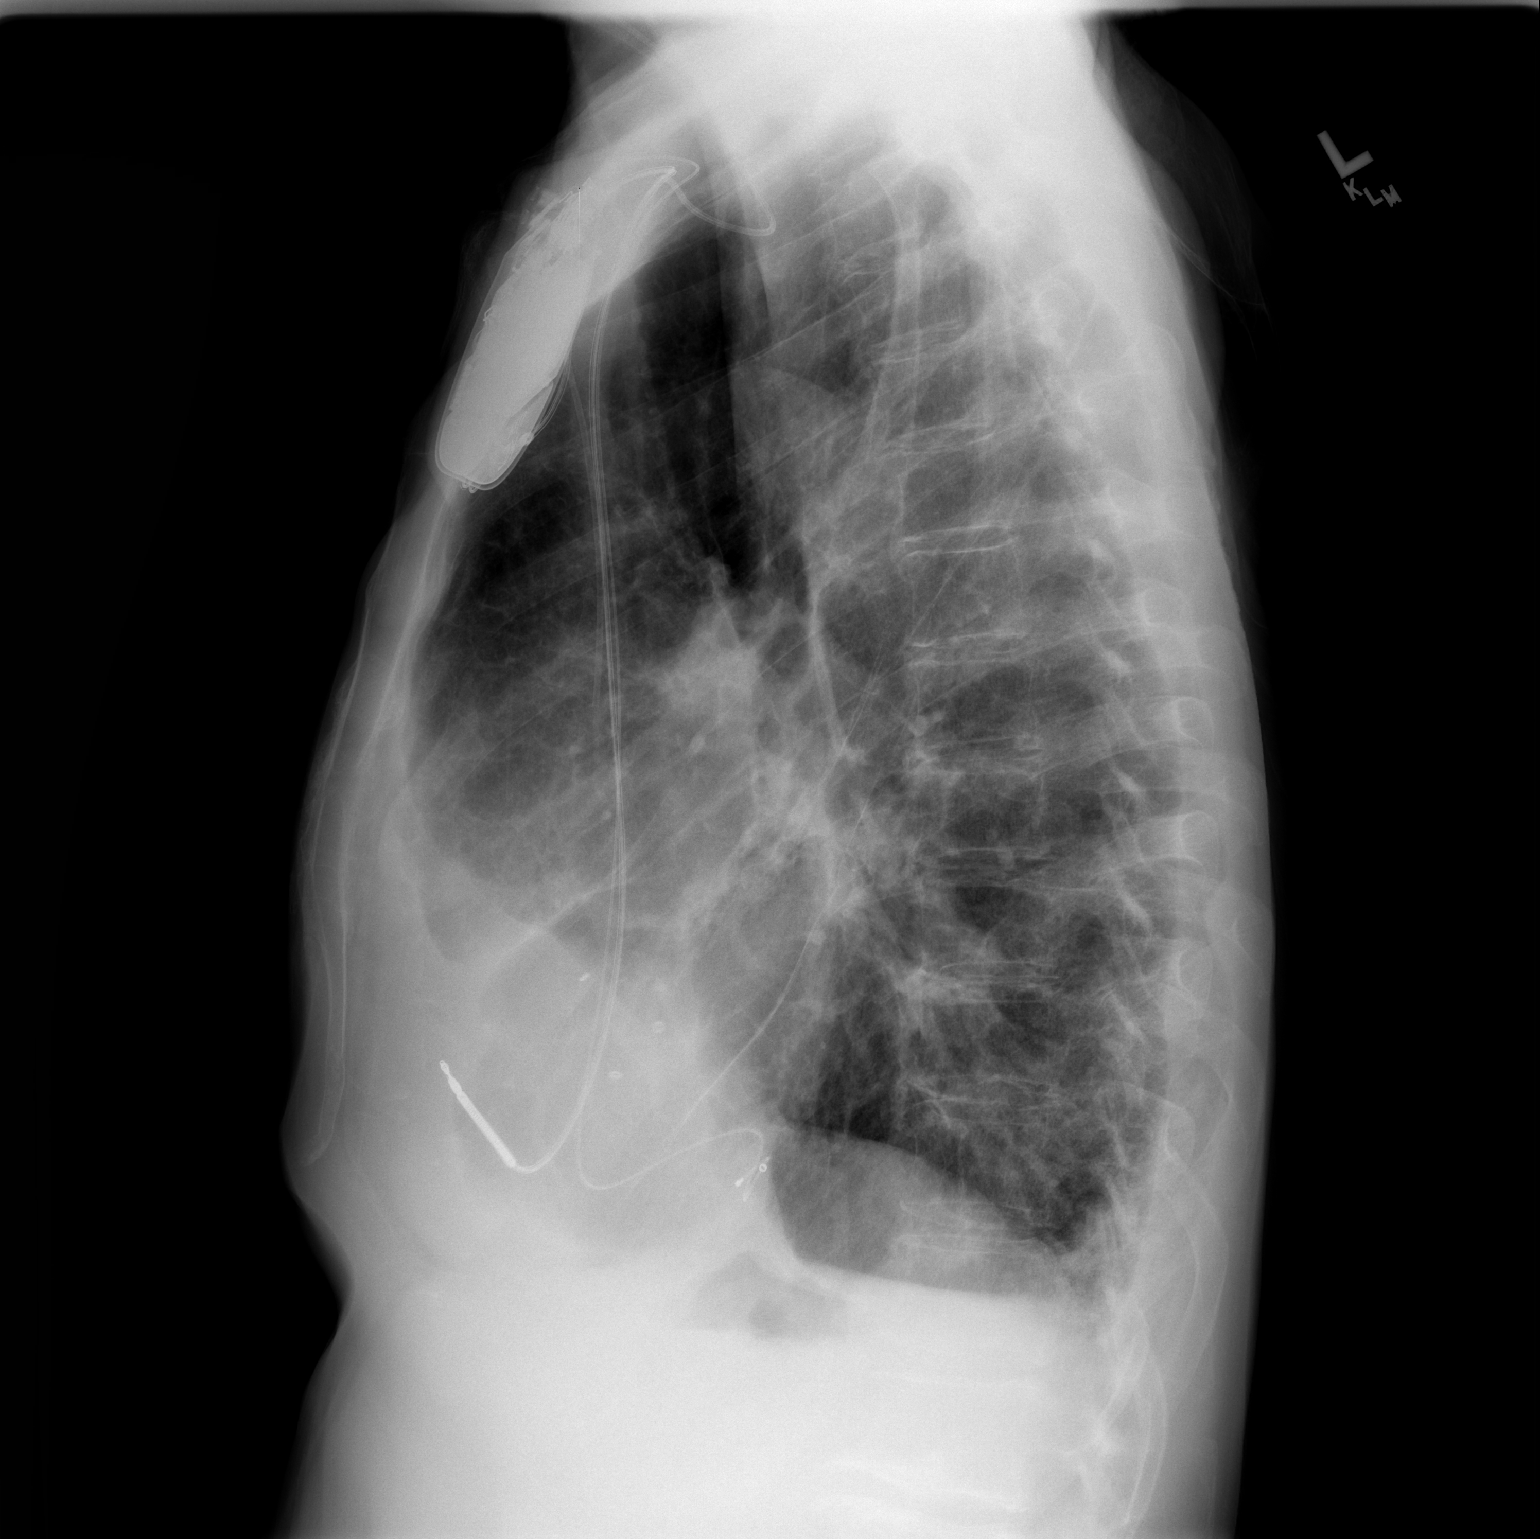

[2 of 2 positions shown; findings below may reference images not displayed]

FINDINGS: Trachea is midline.  Heart is enlarged.  Pacemaker / AICD
lead tips are stable in position.

There has been some interval improvement in mixed interstitial and
airspace disease.  Small bilateral pleural effusions have decreased
in the interval.
IMPRESSION: Decrease in pulmonary edema and bilateral pleural effusions.

## 2010-03-11 ENCOUNTER — Ambulatory Visit: Payer: Self-pay | Admitting: Cardiology

## 2010-03-11 ENCOUNTER — Encounter: Payer: Self-pay | Admitting: Internal Medicine

## 2010-04-05 ENCOUNTER — Ambulatory Visit: Payer: Self-pay | Admitting: Family Medicine

## 2010-04-05 LAB — CONVERTED CEMR LAB
AST: 12 units/L (ref 0–37)
Albumin: 4 g/dL (ref 3.5–5.2)
Alkaline Phosphatase: 70 units/L (ref 39–117)
Basophils Relative: 0.9 % (ref 0.0–3.0)
CO2: 32 meq/L (ref 19–32)
Calcium: 9 mg/dL (ref 8.4–10.5)
Chloride: 102 meq/L (ref 96–112)
Eosinophils Absolute: 0.2 10*3/uL (ref 0.0–0.7)
HDL: 35.4 mg/dL — ABNORMAL LOW (ref >39.00)
Hemoglobin: 16.9 g/dL (ref 13.0–17.0)
Lymphocytes Relative: 15.1 % (ref 12.0–46.0)
MCHC: 34 g/dL (ref 30.0–36.0)
Neutro Abs: 4.9 10*3/uL (ref 1.4–7.7)
Nitrite: NEGATIVE
Potassium: 4.9 meq/L (ref 3.5–5.1)
RBC: 5.19 M/uL (ref 4.22–5.81)
Sodium: 140 meq/L (ref 135–145)
Specific Gravity, Urine: 1.02
Total CHOL/HDL Ratio: 4
Total Protein: 6.7 g/dL (ref 6.0–8.3)
WBC Urine, dipstick: NEGATIVE

## 2010-04-12 ENCOUNTER — Ambulatory Visit: Payer: Self-pay | Admitting: Family Medicine

## 2010-05-23 ENCOUNTER — Emergency Department (HOSPITAL_COMMUNITY): Admission: EM | Admit: 2010-05-23 | Discharge: 2010-05-23 | Payer: Self-pay | Admitting: Emergency Medicine

## 2010-06-07 ENCOUNTER — Emergency Department (HOSPITAL_COMMUNITY): Admission: EM | Admit: 2010-06-07 | Discharge: 2010-06-07 | Payer: Self-pay | Admitting: Emergency Medicine

## 2010-06-08 ENCOUNTER — Ambulatory Visit: Payer: Self-pay | Admitting: Family Medicine

## 2010-06-14 ENCOUNTER — Ambulatory Visit: Payer: Self-pay | Admitting: Family Medicine

## 2010-06-23 ENCOUNTER — Encounter: Payer: Self-pay | Admitting: Infectious Diseases

## 2010-08-03 ENCOUNTER — Encounter: Payer: Self-pay | Admitting: Internal Medicine

## 2010-08-03 ENCOUNTER — Ambulatory Visit
Admission: RE | Admit: 2010-08-03 | Discharge: 2010-08-03 | Payer: Self-pay | Source: Home / Self Care | Attending: Internal Medicine | Admitting: Internal Medicine

## 2010-08-10 NOTE — Miscellaneous (Signed)
  Clinical Lists Changes  Observations: Added new observation of PAST MED HX: MYOCARDIAL INFARCTION, ACUTE (ICD-410.90)...... 10/16/ 2009 HYPERTENSION (ICD-401.9) CEREBROVASCULAR ACCIDENT (ICD-434.91)....2009 ENDOCARDITIS, BACTERIAL (ICD-421.0)....2009 COLONIC POLYPS, ADENOMATOUS (ICD-211.3) AFTERCARE, LONG-TERM USE, ANTICOAGULANTS (ICD-V58.61) ENCOUNTER FOR THERAPEUTIC DRUG MONITORING (ICD-V58.83) HYPERLIPIDEMIA (ICD-272.4) COPD (ICD-496) GERD (ICD-530.81) TRANSIENT ISCHEMIC ATTACK, HX OF (ICD-V12.50) ATRIAL FIBRILLATION (ICD-427.31) .Marland KitchenMarland KitchenCould not have Maze at time of MVR... AV node ablation Cardiomyopathy---Ischemic...hypotension from ACE inhibitor EF 30%... echo.... July 06, 2009.Marland Kitchen..apex dyskinetic... moderate hypokinesis posterior wall and the base and mid inferior walls ASD... closed....2010 Mitral Valve replacement  07/2008....tissue valve  /  echo... December, 2010... ICD / pacer (AV node ablation for rapid atrial fib NO COUMADIN because of prior CNS bleed Renal atery stenosis - mild pulmonary hypertension -  moderate   (07/17/2009 12:01) Added new observation of PRIMARY MD: Roderick Pee MD (07/17/2009 12:01)       Past History:  Past Medical History: MYOCARDIAL INFARCTION, ACUTE (ICD-410.90)...... 10/16/ 2009 HYPERTENSION (ICD-401.9) CEREBROVASCULAR ACCIDENT (ICD-434.91)....2009 ENDOCARDITIS, BACTERIAL (ICD-421.0)....2009 COLONIC POLYPS, ADENOMATOUS (ICD-211.3) AFTERCARE, LONG-TERM USE, ANTICOAGULANTS (ICD-V58.61) ENCOUNTER FOR THERAPEUTIC DRUG MONITORING (ICD-V58.83) HYPERLIPIDEMIA (ICD-272.4) COPD (ICD-496) GERD (ICD-530.81) TRANSIENT ISCHEMIC ATTACK, HX OF (ICD-V12.50) ATRIAL FIBRILLATION (ICD-427.31) .Marland KitchenMarland KitchenCould not have Maze at time of MVR... AV node ablation Cardiomyopathy---Ischemic...hypotension from ACE inhibitor EF 30%... echo.... July 06, 2009.Marland Kitchen..apex dyskinetic... moderate hypokinesis posterior wall and the base and mid inferior walls ASD...  closed....2010 Mitral Valve replacement  07/2008....tissue valve  /  echo... December, 2010... ICD / pacer (AV node ablation for rapid atrial fib NO COUMADIN because of prior CNS bleed Renal atery stenosis - mild pulmonary hypertension -  moderate

## 2010-08-10 NOTE — Letter (Signed)
Summary: Device-Delinqu[facility] Phone Journalist, newspaper, Main Office  [address]   [place], [place] [address]   Phone: [phone]  Fax: [phone]     February 12, 2010 MRN: [id number]   [name] [address] [place], [place]  [address]   Dear Mr. KEPNER,  According to our records, you were scheduled for a device phone transmission on 02-11-2010.     We did not receive any results from this check.  If you transmitted on your scheduled day, please call us to help troubleshoot your system.  If you forgot to send your transmission, please send one upon receipt of this letter.  Thank you,   Arch[facility]ural [facility]

## 2010-08-10 NOTE — Assessment & Plan Note (Signed)
Summary: 8 MO F/U [facility]   Visit Type:  Follow-up Primary Provider:  Roderick Pee MD   History of Present Illness: The patient is seen for followup of coronary artery disease, ischemic cardiomyopathy, bacterial endocarditis, mitral valve replacement, atrial fibrillation with a rapid rate, AV nodal ablation, s/p BiV  ICD placement.  The patient has not received Coumadin because of a prior significant CNS bleed.  Patient also had a TIA in the past. Patient is doing remarkably well. He denies c/p, sob, or peripheral edema.  Current Medications (verified): 1)  Nizatidine 150 Mg  Caps (Nizatidine) .... Two Times A Day 2)  Coreg 3.125 Mg Tabs (Carvedilol) .... Take 1 Tablet By Mouth Two Times A Day 3)  Furosemide 40 Mg Tabs (Furosemide) .... Take 1 Tablet By Mouth Once A Day 4)  Bufpirin 325 Mg Tabs (Aspirin Buffered) .... Take 1 Tablet By Mouth Once A Day 5)  Nitroglycerin 0.4 Mg Subl (Nitroglycerin) .... As Needed 6)  Advair Diskus 500-50 Mcg/dose Misc (Fluticasone-Salmeterol) .... Inhaled Two Times A Day 7)  Amoxicillin 500 Mg Caps (Amoxicillin) .... 4 Caps Before Dental Appts 8)  Ramipril 2.5 Mg Caps (Ramipril) .... Take One Capsule By Mouth Daily  Allergies: 1)  ! Coumadin  Past History:  Past Medical History: Last updated: 07/17/2009 MYOCARDIAL INFARCTION, ACUTE (ICD-410.90)...... 10/16/ 2009 HYPERTENSION (ICD-401.9) CEREBROVASCULAR ACCIDENT (ICD-434.91)....2009 ENDOCARDITIS, BACTERIAL (ICD-421.0)....2009 COLONIC POLYPS, ADENOMATOUS (ICD-211.3) AFTERCARE, LONG-TERM USE, ANTICOAGULANTS (ICD-V58.61) ENCOUNTER FOR THERAPEUTIC DRUG MONITORING (ICD-V58.83) HYPERLIPIDEMIA (ICD-272.4) COPD (ICD-496) GERD (ICD-530.81) TRANSIENT ISCHEMIC ATTACK, HX OF (ICD-V12.50) ATRIAL FIBRILLATION (ICD-427.31) .Marland KitchenMarland KitchenCould not have Maze at time of MVR... AV node ablation Cardiomyopathy---Ischemic...hypotension from ACE inhibitor EF 30%... echo.... July 06, 2009.Marland Kitchen..apex dyskinetic... moderate  hypokinesis posterior wall and the base and mid inferior walls ASD... closed....2010 Mitral Valve replacement  07/2008....tissue valve  /  echo... December, 2010...valve working very well ICD / pacer (AV node ablation for rapid atrial fib NO COUMADIN because of prior CNS bleed Renal atery stenosis - mild pulmonary hypertension -  moderate  Past Surgical History: Last updated: 10/23/2008 PENILE IMPLANT Appendectomy BX MASS L NECK  loop recorder 05/23/01   Explantation of an expired loop recorder.11/10/2007  Right thoracotomy for mitral valve replacement  07/17/2008   Review of Systems  The patient denies chest pain, syncope, dyspnea on exertion, and peripheral edema.    Vital Signs:  Patient profile:   65 year old male Height:      72 inches Weight:      189 pounds BMI:     25.73 Pulse rate:   68 / minute BP sitting:   100 / 66  (left arm)  Vitals Entered By: Laurance Flatten CMA (July 28, 2009 3:34 PM)  Physical Exam  General:  Well developed, middle aged man, well nourished, in no acute distress.  HEENT: normal Neck: supple. No JVD. Carotids 2+ bilaterally no bruits Cor: RRR no rubs, gallops or murmur Lungs: CTA.  Well healed ICD incision. Ab: soft, nontender. nondistended. No HSM. Good bowel sounds Ext: warm. no cyanosis, clubbing or edema Neuro: alert and oriented. Grossly nonfocal. affect pleasant     ICD Specifications Following MD:  Lewayne Bunting, MD     ICD Vendor:  [employer] Jude     ICD Model Number:  ZO1096-04     ICD Serial Number:  [id number] ICD DOI:  07/23/2008     ICD Implanting MD:  Sherryl Manges, MD  Lead 1:    Location: RV     DOI: 07/23/2008  Model #: [id number]     Serial #: [id number]     Status: active Lead 2:    Location: LV     DOI: 07/23/2008     Model #: 1158T     Serial #: [id number]     Status: active  ICD Follow Up Remote Check?  No Battery Voltage:  3.20 V     Charge Time:  11  seconds     Battery Est. Longevity:  5.7 years Underlying rhythm:   dependent ICD Dependent:  Yes       ICD Device Measurements Right Ventricle:  Amplitude: 11.7 mV, Impedance: 460 ohms, Threshold: 0.5 V at 0.5 msec Left Ventricle:  Impedance: 950 ohms, Threshold: 0.75 V at 0.5 msec  Episodes Coumadin:  No Shock:  0     ATP:  0     Nonsustained:  0     Ventricular Pacing:  100%  Brady Parameters Mode VVIR     Lower Rate Limit:  70     Upper Rate Limit 110  Tachy Zones VF:  222     VT:  200     VT1:  162     Next Remote Date:  10/26/2009     Next Cardiology Appt Due:  07/11/2010 Tech Comments:  No parameter changes.   Device function normal.  [facility] transmissions every 3 months.  ROV 1 year Dr. Ladona Ridgel. Altha Harm, LPN  July 28, 2009 3:48 PM  MD Comments:  Agree with above.  Impression & Recommendations:  Problem # 1:  AUTOMATIC IMPLANTABLE CARDIAC DEFIBRILLATOR SITU (ICD-V45.02) His BiV ICD is working normally today.  I will recheck in several months.    Problem # 2:  HYPERTENSION (ICD-401.9) His blood pressure is well controlled.  A low sodium diet is recommended. His updated medication list for this problem includes:    Coreg 3.125 Mg Tabs (Carvedilol) .Marland Kitchen... Take 1 tablet by mouth two times a day    Furosemide 40 Mg Tabs (Furosemide) .Marland Kitchen... Take 1 tablet by mouth once a day    Bufpirin 325 Mg Tabs (Aspirin buffered) .Marland Kitchen... Take 1 tablet by mouth once a day    Ramipril 2.5 Mg Caps (Ramipril) .Marland Kitchen... Take one capsule by mouth daily  Problem # 3:  ATRIAL FIBRILLATION (ICD-427.31) He remains in chronic atrial fibrillation s/p AV Node ablation. His updated medication list for this problem includes:    Coreg 3.125 Mg Tabs (Carvedilol) .Marland Kitchen... Take 1 tablet by mouth two times a day    Bufpirin 325 Mg Tabs (Aspirin buffered) .Marland Kitchen... Take 1 tablet by mouth once a day  Problem # 4:  CHRONIC SYSTOLIC HEART FAILURE (ICD-428.22) He remains class 2.  Continue meds below and maintain a low sodium diet. His updated medication list for this problem includes:     Coreg 3.125 Mg Tabs (Carvedilol) .Marland Kitchen... Take 1 tablet by mouth two times a day    Furosemide 40 Mg Tabs (Furosemide) .Marland Kitchen... Take 1 tablet by mouth once a day    Bufpirin 325 Mg Tabs (Aspirin buffered) .Marland Kitchen... Take 1 tablet by mouth once a day    Nitroglycerin 0.4 Mg Subl (Nitroglycerin) .Marland Kitchen... As needed    Ramipril 2.5 Mg Caps (Ramipril) .Marland Kitchen... Take one capsule by mouth daily  Patient Instructions: 1)  Your physician recommends that you schedule a follow-up appointment in: 12 months with Dr Ladona Ridgel

## 2010-08-10 NOTE — Cardiovascular Report (Signed)
Summary: Office Visit Remote   Office Visit Remote   Imported By: Roderic Ovens 11/11/2009 10:07:54  _____________________________________________________________________  External Attachment:    Type:   Image     Comment:   External Document

## 2010-08-10 NOTE — Letter (Signed)
Summary: Remote Device Check  [employer], Main Office  [address]   [place], [place] [address]   Phone: [phone]  Fax: [phone]     November 06, 2009 MRN: [id number]   [name] [address] [place], [place]  [address]   Dear [name],   Your remote transmission was recieved and reviewed by your physician.  All diagnostics were within normal limits for you.  __X___Your next transmission is scheduled for:  January 28, 2010.  Please transmit at any time this day.  If you have a wireless device your transmission will be sent automatically.     Sincerely,  Proofreader

## 2010-08-10 NOTE — Progress Notes (Signed)
Summary: Question about pacer phone check  due 01/28/10  Phone Note Call from Patient Call back at Home Phone [phone]   Caller: Patient Summary of Call: Pt have question about pacer phone check for 01/28/10 Initial call taken by: Judie Grieve,  Dec 01, 2009 3:14 PM  Follow-up for Phone Call        Patient will be out of town on 7/21, we rescheduled remote for 02/11/10. Follow-up by: Altha Harm, LPN,  Dec 02, 2009 4:52 PM

## 2010-08-10 NOTE — Cardiovascular Report (Signed)
Summary: Office Visit   Office Visit   Imported By: Roderic Ovens 08/03/2009 13:24:59  _____________________________________________________________________  External Attachment:    Type:   Image     Comment:   External Document

## 2010-08-10 NOTE — Cardiovascular Report (Signed)
Summary: Office Visit   Office Visit   Imported By: Roderic Ovens 04/06/2010 15:27:37  _____________________________________________________________________  External Attachment:    Type:   Image     Comment:   External Document

## 2010-08-10 NOTE — Procedures (Signed)
Summary: [facility]   Current Medications (verified): 1)  Nizatidine 150 Mg  Caps (Nizatidine) .... Two Times A Day 2)  Coreg 3.125 Mg Tabs (Carvedilol) .... Take 1 Tablet By Mouth Two Times A Day 3)  Furosemide 40 Mg Tabs (Furosemide) .... Take 1 Tablet By Mouth Once A Day 4)  Bufpirin 325 Mg Tabs (Aspirin Buffered) .... Take 1 Tablet By Mouth Once A Day 5)  Nitroglycerin 0.4 Mg Subl (Nitroglycerin) .... As Needed 6)  Advair Diskus 500-50 Mcg/dose Misc (Fluticasone-Salmeterol) .... Inhaled Two Times A Day 7)  Amoxicillin 500 Mg Caps (Amoxicillin) .... 4 Caps Before Dental Appts 8)  Ramipril 2.5 Mg Caps (Ramipril) .... Take One Capsule By Mouth Daily  Allergies (verified): 1)  ! Coumadin   ICD Specifications Following MD:  Lewayne Bunting, MD     ICD Vendor:  [employer]     ICD Model Number:  ZO1096-04     ICD Serial Number:  [id number] ICD DOI:  07/23/2008     ICD Implanting MD:  Sherryl Manges, MD  Lead 1:    Location: RV     DOI: 07/23/2008     Model #: [id number]     Serial #: [id number]     Status: active Lead 2:    Location: LV     DOI: 07/23/2008     Model #: 1158T     Serial #: [id number]     Status: active  ICD Follow Up Battery Voltage:  3.17 V     Charge Time:  10.9 seconds     Battery Est. Longevity:  5.0-5.3 YRS Underlying rhythm:  SINUS BRADY ICD Dependent:  Yes       ICD Device Measurements Right Ventricle:  Amplitude: 11.7 mV, Impedance: 560 ohms, Threshold: 0.5 V at 0.5 msec Left Ventricle:  Impedance: 960 ohms, Threshold: 0.75 V at 0.8 msec Shock Impedance: 77 ohms   Episodes MS Episodes:  0     Coumadin:  No Shock:  0     ATP:  0     Nonsustained:  0     Atrial Therapies:  0 Ventricular Pacing:  99%  Brady Parameters Mode VVIR     Lower Rate Limit:  70     Upper Rate Limit 110  Tachy Zones VF:  222     VT:  200     VT1:  162     Next Cardiology Appt Due:  07/12/2010 Tech Comments:  PT IN FOR OV W/DR KATZ.  PT WAS OUT OF TOWN FOR REMOTE CHECK.  PT IS AWARE THAT  [facility] TRANSMITTER CAN BE TAKEN WITH HIM.  NORMAL DEVICE FUNCTION.  NO EPISODES SINCE LAST CHECK.  NO CHANGES MADE.  ROV IN JAN 2012 W/GT. Vella Kohler  March 11, 2010 1:43 PM

## 2010-08-10 NOTE — Miscellaneous (Signed)
  Clinical Lists Changes  Observations: Added new observation of PAST MED HX: MYOCARDIAL INFARCTION, ACUTE (ICD-410.90)...... 10/16/ 2009 HYPERTENSION (ICD-401.9) CEREBROVASCULAR ACCIDENT (ICD-434.91)....2009 ENDOCARDITIS, BACTERIAL (ICD-421.0)....2009 COLONIC POLYPS, ADENOMATOUS (ICD-211.3) AFTERCARE, LONG-TERM USE, ANTICOAGULANTS (ICD-V58.61) ENCOUNTER FOR THERAPEUTIC DRUG MONITORING (ICD-V58.83) HYPERLIPIDEMIA (ICD-272.4) COPD (ICD-496) GERD (ICD-530.81) TRANSIENT ISCHEMIC ATTACK, HX OF (ICD-V12.50) ATRIAL FIBRILLATION (ICD-427.31) .Marland KitchenMarland KitchenCould not have Maze at time of MVR... AV node ablation Cardiomyopathy---Ischemic...hypotension from ACE inhibitor ASD... closed....2010 Mitral Valve replacement  07/2008....tissue valve  /   ICD / pacer (AV node ablation for rapid atrial fib NO COUMADIN because of prior CNS bleed Renal atery stenosis - mild pulmonary hypertension -  moderate   (07/17/2009 10:33) Added new observation of PRIMARY MD: Roderick Pee MD (07/17/2009 10:33)       Past History:  Past Medical History: MYOCARDIAL INFARCTION, ACUTE (ICD-410.90)...... 10/16/ 2009 HYPERTENSION (ICD-401.9) CEREBROVASCULAR ACCIDENT (ICD-434.91)....2009 ENDOCARDITIS, BACTERIAL (ICD-421.0)....2009 COLONIC POLYPS, ADENOMATOUS (ICD-211.3) AFTERCARE, LONG-TERM USE, ANTICOAGULANTS (ICD-V58.61) ENCOUNTER FOR THERAPEUTIC DRUG MONITORING (ICD-V58.83) HYPERLIPIDEMIA (ICD-272.4) COPD (ICD-496) GERD (ICD-530.81) TRANSIENT ISCHEMIC ATTACK, HX OF (ICD-V12.50) ATRIAL FIBRILLATION (ICD-427.31) .Marland KitchenMarland KitchenCould not have Maze at time of MVR... AV node ablation Cardiomyopathy---Ischemic...hypotension from ACE inhibitor ASD... closed....2010 Mitral Valve replacement  07/2008....tissue valve  /   ICD / pacer (AV node ablation for rapid atrial fib NO COUMADIN because of prior CNS bleed Renal atery stenosis - mild pulmonary hypertension -  moderate

## 2010-08-10 NOTE — Miscellaneous (Signed)
  Clinical Lists Changes  Observations: Added new observation of ECHOINTERP:  - Normal LV size with moderate to severely decreased systolic     function, EF 30%. The apex was dyskinetic; the apical inferior,     anterior, and septal walls were akinetic; and the posterior wall     and basal to mid inferior walls were moderately hypokinetic. No     definite LV apical thrombus was seen. There was a bioprosthetic     mitral valve with normal function. The RV was normal in size with     mild hypokinesis. (07/06/2009 14:47)      Echocardiogram  Procedure date:  07/06/2009  Findings:       - Normal LV size with moderate to severely decreased systolic     function, EF 30%. The apex was dyskinetic; the apical inferior,     anterior, and septal walls were akinetic; and the posterior wall     and basal to mid inferior walls were moderately hypokinetic. No     definite LV apical thrombus was seen. There was a bioprosthetic     mitral valve with normal function. The RV was normal in size with     mild hypokinesis.

## 2010-08-10 NOTE — Assessment & Plan Note (Signed)
Summary: seen in ER /concerned about low BP/dm   Vital Signs:  Patient profile:   65 year old male Weight:      202 pounds Temp:     97.7 degrees F oral Pulse rate:   64 / minute Pulse rhythm:   regular BP sitting:   98 / 52  (left arm) Cuff size:   regular  Vitals Entered By: Kern Reap CMA Duncan Dull) (June 08, 2010 1:30 PM) CC: follow-up visit   Primary Care Provider:  Roderick Pee MD  CC:  follow-up visit.  History of Present Illness: [name] is a 65 year old male, who comes in today accompanied by his wife, who now is evolving into his primary caregiver because he is having difficulty remembering things.  Over the weekend.  He went to the emergency room for evaluation of a headache as been present for two days.  Workup including CT brain scan was unrevealing.  His blood pressure today 98/52.  Right arm sitting position, and he is lightheaded when he stands up.  We will decrease his medication slowly because we want to be sure his pressure is not too low.  We also want to be sure that we give him the cardiac protection than he needs because of his chronic systolic heart failure   Allergies: 1)  ! Coumadin  Past History:  Past medical, surgical, family and social histories (including risk factors) reviewed for relevance to current acute and chronic problems.  Past Medical History: Reviewed history from 03/11/2010 and no changes required. MYOCARDIAL INFARCTION, ACUTE (ICD-410.90)...... 10/16/ 2009 HYPERTENSION (ICD-401.9) CEREBROVASCULAR ACCIDENT (ICD-434.91)....2009 ENDOCARDITIS, BACTERIAL (ICD-421.0)....2009 COLONIC POLYPS, ADENOMATOUS (ICD-211.3) AFTERCARE, LONG-TERM USE, ANTICOAGULANTS (ICD-V58.61) ENCOUNTER FOR THERAPEUTIC DRUG MONITORING (ICD-V58.83) HYPERLIPIDEMIA (ICD-272.4) COPD (ICD-496) GERD (ICD-530.81) TRANSIENT ISCHEMIC ATTACK, HX OF (ICD-V12.50) ATRIAL FIBRILLATION (ICD-427.31) .Marland KitchenMarland KitchenCould not have Maze at time of MVR... AV node  ablation Cardiomyopathy---Ischemic...hypotension from ACE inhibitor  / low dose ACE inhibitor started January, 2010 EF 30%... echo.... July 06, 2009.Marland Kitchen..apex dyskinetic... moderate hypokinesis posterior wall and the base and mid inferior walls ASD... closed....2010 Mitral Valve replacement  07/2008....tissue valve  /  echo... December, 2010...valve working very well ICD / pacer (AV node ablation for rapid atrial fib NO COUMADIN because of prior CNS bleed Renal atery stenosis - mild pulmonary hypertension -  moderate  Past Surgical History: Reviewed history from 10/23/2008 and no changes required. PENILE IMPLANT Appendectomy BX MASS L NECK  loop recorder 05/23/01   Explantation of an expired loop recorder.11/10/2007  Right thoracotomy for mitral valve replacement  07/17/2008   Family History: Reviewed history from 08/13/2008 and no changes required. cancer- stom[facility] - father.  Family History of Stroke F 1st degree relative <60  Social History: Reviewed history from 08/13/2008 and no changes required. Former Smoker quit 2009 Married Alcohol use-no  Review of Systems      See HPI  Physical Exam  General:  Well-developed,well-nourished,in no acute distress; alert,appropriate and cooperative throughout examination Head:  Normocephalic and atraumatic without obvious abnormalities. No apparent alopecia or balding. Eyes:  No corneal or conjunctival inflammation noted. EOMI. Perrla. Funduscopic exam benign, without hemorrhages, exudates or papilledema. Vision grossly normal. Ears:  External ear exam shows no significant lesions or deformities.  Otoscopic examination reveals clear canals, tympanic membranes are intact bilaterally without bulging, retraction, inflammation or discharge. Hearing is grossly normal bilaterally. Nose:  External nasal examination shows no deformity or inflammation. Nasal mucosa are pink and moist without lesions or exudates. Mouth:  Oral mucosa and oropharynx  without lesions  or exudates.  Teeth in good repair.   Impression & Recommendations:  Problem # 1:  HYPERTENSION (ICD-401.9) Assessment Deteriorated  His updated medication list for this problem includes:    Coreg 3.125 Mg Tabs (Carvedilol) .Marland Kitchen... Take 1 tablet by mouth two times a day    Furosemide 40 Mg Tabs (Furosemide) .Marland Kitchen... Take 1 tablet by mouth once a day    Ramipril 2.5 Mg Caps (Ramipril) .Marland Kitchen... Take one capsule by mouth daily  Complete Medication List: 1)  Nizatidine 150 Mg Caps (Nizatidine) .... Two times a day 2)  Coreg 3.125 Mg Tabs (Carvedilol) .... Take 1 tablet by mouth two times a day 3)  Furosemide 40 Mg Tabs (Furosemide) .... Take 1 tablet by mouth once a day 4)  Bufpirin 325 Mg Tabs (Aspirin buffered) .... Take 1 tablet by mouth once a day 5)  Nitroglycerin 0.4 Mg Subl (Nitroglycerin) .... As needed 6)  Advair Diskus 500-50 Mcg/dose Misc (Fluticasone-salmeterol) .... Inhaled two times a day 7)  Ramipril 2.5 Mg Caps (Ramipril) .... Take one capsule by mouth daily  Patient Instructions: 1)  only take one coreg daily. 2)  Continue your other medications. 3)  Check your blood pressure morning midafternoon and bedtime. 4)  Return in one week for follow-up with the data and the device   Orders Added: 1)  Est. Patient Level III [73220]

## 2010-08-10 NOTE — Assessment & Plan Note (Signed)
Summary: f14m   Visit Type:  Follow-up Primary Sonali Wivell:  Roderick Pee MD  CC:  mitral valve surgery.  History of Present Illness: The patient returns for followup of his multiple cardiac problems. He is doing great.  He is fully active.  He has no chest pain or shortness of breath.  Current Medications (verified): 1)  Nizatidine 150 Mg  Caps (Nizatidine) .... Two Times A Day 2)  Coreg 3.125 Mg Tabs (Carvedilol) .... Take 1 Tablet By Mouth Two Times A Day 3)  Furosemide 40 Mg Tabs (Furosemide) .... Take 1 Tablet By Mouth Once A Day 4)  Bufpirin 325 Mg Tabs (Aspirin Buffered) .... Take 1 Tablet By Mouth Once A Day 5)  Nitroglycerin 0.4 Mg Subl (Nitroglycerin) .... As Needed 6)  Advair Diskus 500-50 Mcg/dose Misc (Fluticasone-Salmeterol) .... Inhaled Two Times A Day 7)  Amoxicillin 500 Mg Caps (Amoxicillin) .... 4 Caps Before Dental Appts 8)  Ramipril 2.5 Mg Caps (Ramipril) .... Take One Capsule By Mouth Daily  Allergies (verified): 1)  ! Coumadin  Past History:  Past Medical History: MYOCARDIAL INFARCTION, ACUTE (ICD-410.90)...... 10/16/ 2009 HYPERTENSION (ICD-401.9) CEREBROVASCULAR ACCIDENT (ICD-434.91)....2009 ENDOCARDITIS, BACTERIAL (ICD-421.0)....2009 COLONIC POLYPS, ADENOMATOUS (ICD-211.3) AFTERCARE, LONG-TERM USE, ANTICOAGULANTS (ICD-V58.61) ENCOUNTER FOR THERAPEUTIC DRUG MONITORING (ICD-V58.83) HYPERLIPIDEMIA (ICD-272.4) COPD (ICD-496) GERD (ICD-530.81) TRANSIENT ISCHEMIC ATTACK, HX OF (ICD-V12.50) ATRIAL FIBRILLATION (ICD-427.31) .Marland KitchenMarland KitchenCould not have Maze at time of MVR... AV node ablation Cardiomyopathy---Ischemic...hypotension from ACE inhibitor  / low dose ACE inhibitor started January, 2010 EF 30%... echo.... July 06, 2009.Marland Kitchen..apex dyskinetic... moderate hypokinesis posterior wall and the base and mid inferior walls ASD... closed....2010 Mitral Valve replacement  07/2008....tissue valve  /  echo... December, 2010...valve working very well ICD / pacer (AV node  ablation for rapid atrial fib NO COUMADIN because of prior CNS bleed Renal atery stenosis - mild pulmonary hypertension -  moderate  Review of Systems       Patient denies fever, chills, headache, sweats, rash, change in vision, change in hearing, chest pain, cough, nausea vomiting, urinary symptoms.  All of the systems are reviewed and are negative.  Vital Signs:  Patient profile:   65 year old male Height:      72 inches Weight:      197 pounds BMI:     26.81 Pulse rate:   70 / minute BP sitting:   110 / 82  (right arm) Cuff size:   regular  Vitals Entered By: Hardin Negus, RMA (March 11, 2010 10:52 AM)  Physical Exam  General:  patient looked great. Eyes:  no xanthelasma. Neck:  no jugular venous attention. Lungs:  lungs are clear.  Respiratory effort is nonlabored. Heart:  cardiac exam reveals S1-S2.  No clicks or significant murmurs. Abdomen:  abdomen is soft. Extremities:  no peripheral edema. Psych:  patient is oriented to person time and place.  Affect is normal.    ICD Specifications Following MD:  Lewayne Bunting, MD     ICD Vendor:  St Jude     ICD Model Number:  NF6213-08     ICD Serial Number:  [id number] ICD DOI:  07/23/2008     ICD Implanting MD:  Sherryl Manges, MD  Lead 1:    Location: [facility]     DOI: 07/23/2008     Model #: [id number]     Serial #: [id number]     Status: active Lead 2:    Location: [facility]     DOI: 07/23/2008     Model #: [id number]  Serial #: [id number]     Status: active  ICD Follow Up ICD Dependent:  Yes      Episodes Coumadin:  No  Brady Parameters Mode VVIR     Lower Rate Limit:  70     Upper Rate Limit 110  Tachy Zones VF:  222     VT:  200     VT1:  162     Impression & Recommendations:  Problem # 1:  CHRONIC SYSTOLIC HEART FAILURE (ICD-428.22)  His updated medication list for this problem includes:    Coreg 3.125 Mg Tabs (Carvedilol) .Marland Kitchen... Take 1 tablet by mouth two times a day    Furosemide 40 Mg Tabs (Furosemide) .Marland Kitchen... Take 1 tablet by  mouth once a day    Bufpirin 325 Mg Tabs (Aspirin buffered) .Marland Kitchen... Take 1 tablet by mouth once a day    Nitroglycerin 0.4 Mg Subl (Nitroglycerin) .Marland Kitchen... As needed    Ramipril 2.5 Mg Caps (Ramipril) .Marland Kitchen... Take one capsule by mouth daily The patient's heart failure is under great control.  I will not be pushing his medications as he is at significant symptoms when we push his medicines.  I will consider a followup echo at some point.  Problem # 2:  MITRAL VALVE REPLACEMENT, HX OF (ICD-V15.1) The patient is doing extremely well post mitral valve replacement.  There is no murmur or mitral regurgitation.  Problem # 3:  AUTOMATIC IMPLANTABLE CARDIAC DEFIBRILLATOR SITU (ICD-V45.02) ICD is interrogated today.  He's had no unusual activity.  Problem # 4:  ATRIAL FIBRILLATION (ICD-427.31)  His updated medication list for this problem includes:    Coreg 3.125 Mg Tabs (Carvedilol) .Marland Kitchen... Take 1 tablet by mouth two times a day    Bufpirin 325 Mg Tabs (Aspirin buffered) .Marland Kitchen... Take 1 tablet by mouth once a day of course I am concerned about the fact that he has atrial fib and anticoagulation cannot be used.  He is on aspirin and is doing extremely well.  Six-month followup.  Patient Instructions: 1)  Your physician recommends that you continue on your current medications as directed. Please refer to the Current Medication list given to you today. 2)  Your physician wants you to follow-up in:  6 months You will receive a reminder letter in the mail two months in advance. If you don't receive a letter, please call our office to schedule the follow-up appointment.

## 2010-08-10 NOTE — Assessment & Plan Note (Signed)
Summary: per check out/sf   Visit Type:  Follow-up Primary Provider:  Roderick Pee MD  CC:  cardiomyopathy.  History of Present Illness: Patient returns today to followup cardiomyopathy and mitral regurgitation.  When I saw him last decision was made to repeat his 2-D echo.  This was done and his mitral valve prosthesis is working very well.  He continues to have an ejection fraction of 30%.  He is fully active..  He has made an incredible recovery.  Current Medications (verified): 1)  Nizatidine 150 Mg  Caps (Nizatidine) .... Two Times A Day 2)  Coreg 3.125 Mg Tabs (Carvedilol) .... Take 1 Tablet By Mouth Two Times A Day 3)  Furosemide 40 Mg Tabs (Furosemide) .... Take 1 Tablet By Mouth Once A Day 4)  Bufpirin 325 Mg Tabs (Aspirin Buffered) .... Take 1 Tablet By Mouth Once A Day 5)  Nitroglycerin 0.4 Mg Subl (Nitroglycerin) .... As Needed 6)  Advair Diskus 500-50 Mcg/dose Misc (Fluticasone-Salmeterol) .... Inhaled Two Times A Day 7)  Amoxicillin 500 Mg Caps (Amoxicillin) .... 4 Caps Before Dental Appts  Allergies (verified): 1)  ! Coumadin  Past History:  Past Medical History: MYOCARDIAL INFARCTION, ACUTE (ICD-410.90)...... 10/16/ 2009 HYPERTENSION (ICD-401.9) CEREBROVASCULAR ACCIDENT (ICD-434.91)....2009 ENDOCARDITIS, BACTERIAL (ICD-421.0)....2009 COLONIC POLYPS, ADENOMATOUS (ICD-211.3) AFTERCARE, LONG-TERM USE, ANTICOAGULANTS (ICD-V58.61) ENCOUNTER FOR THERAPEUTIC DRUG MONITORING (ICD-V58.83) HYPERLIPIDEMIA (ICD-272.4) COPD (ICD-496) GERD (ICD-530.81) TRANSIENT ISCHEMIC ATTACK, HX OF (ICD-V12.50) ATRIAL FIBRILLATION (ICD-427.31) .Marland KitchenMarland KitchenCould not have Maze at time of MVR... AV node ablation Cardiomyopathy---Ischemic...hypotension from ACE inhibitor EF 30%... echo.... July 06, 2009.Marland Kitchen..apex dyskinetic... moderate hypokinesis posterior wall and the base and mid inferior walls ASD... closed....2010 Mitral Valve replacement  07/2008....tissue valve  /  echo... December,  2010...valve working very well ICD / pacer (AV node ablation for rapid atrial fib NO COUMADIN because of prior CNS bleed Renal atery stenosis - mild pulmonary hypertension -  moderate  Review of Systems       Patient denies fever, chills, headache, sweats, rash, change in vision, change in hearing, chest pain, shortness of breath, cough, nausea vomiting, urinary symptoms, musculoskeletal problems.  All other systems are reviewed and are negative.  Vital Signs:  Patient profile:   65 year old male Height:      72 inches Weight:      191 pounds BMI:     26.00 Pulse rate:   70 / minute BP sitting:   114 / 66  (left arm) Cuff size:   regular  Vitals Entered By: Hardin Negus, RMA (July 17, 2009 2:01 PM)  Physical Exam  General:  he looks great today. Eyes:  no xanthelasma. Neck:  no jugular venous distention. Lungs:  lungs are clear respiratory effort is nonlabored. Heart:  cardiac exam reveals S1-S2.  No clicks or significant murmurs. Abdomen:  abdomen is soft. Extremities:  no peripheral edema. Psych:  patient is oriented to person time and place.  Affect is normal.    ICD Specifications ICD Vendor:  [employer] Jude     ICD Model Number:  [id number]     ICD Serial Number:  [id number] ICD DOI:  07/23/2008     ICD Implanting MD:  Sherryl Manges, MD  Lead 1:    Location: [facility]     DOI: 07/23/2008     Model #: [id number]     Serial #: [id number]     Status: active Lead 2:    Location: [facility]     DOI: 07/23/2008     Model #: [id number]  Serial #: [id number]     Status: active  ICD Follow Up ICD Dependent:  Yes      Episodes Coumadin:  No  Brady Parameters Mode VVIR     Lower Rate Limit:  70     Upper Rate Limit 110  Tachy Zones VF:  222     VT:  200     VT1:  162     Impression & Recommendations:  Problem # 1:  MITRAL VALVE REPLACEMENT, HX OF (ICD-V15.1) The patient's mitral valve works very well by followup echo.  No further workup.  Problem # 2:  CARDIOMYOPATHY (ICD-425.4)  His updated  medication list for this problem includes:    Coreg 3.125 Mg Tabs (Carvedilol) .Marland Kitchen... Take 1 tablet by mouth two times a day    Furosemide 40 Mg Tabs (Furosemide) .Marland Kitchen... Take 1 tablet by mouth once a day    Bufpirin 325 Mg Tabs (Aspirin buffered) .Marland Kitchen... Take 1 tablet by mouth once a day    Nitroglycerin 0.4 Mg Subl (Nitroglycerin) .Marland Kitchen... As needed    Ramipril 2.5 Mg Caps (Ramipril) .Marland Kitchen... Take one capsule by mouth daily The patient has significant ongoing cardiomyopathy.  He has no significant symptoms.  In the past I was not able to use an ACE inhibitor because of hypotension.  His blood pressure is now stable enough that we can give this a try carefully.  We'll put him on 2.5 mg of ramapril.  Problem # 3:  HYPERTENSION, BENIGN (ICD-401.1)  His updated medication list for this problem includes:    Coreg 3.125 Mg Tabs (Carvedilol) .Marland Kitchen... Take 1 tablet by mouth two times a day    Furosemide 40 Mg Tabs (Furosemide) .Marland Kitchen... Take 1 tablet by mouth once a day    Bufpirin 325 Mg Tabs (Aspirin buffered) .Marland Kitchen... Take 1 tablet by mouth once a day    Ramipril 2.5 Mg Caps (Ramipril) .Marland Kitchen... Take one capsule by mouth daily blood pressure is 115 today.  We're adding 2.5 mg ramapril  Patient Instructions: 1)  Start Ramipril 2.5mg  daily 2)  Follow up in 2 months Prescriptions: RAMIPRIL 2.5 MG CAPS (RAMIPRIL) Take one capsule by mouth daily  #30 x 6   Entered by:   Meredith Staggers, RN   Authorized by:   Talitha Givens, MD, [facility]   Signed by:   Meredith Staggers, RN on 07/17/2009   Method used:   Electronically to        [facility] [phone]* (retail)       [address]       [place], [place]  [address]       Ph: [phone] or [phone]       Fax: [phone]   RxID:   [id number]

## 2010-08-10 NOTE — Assessment & Plan Note (Signed)
Summary: F2M   Visit Type:  Follow-up Primary Provider:  Roderick Pee MD  CC:  cardiomyopathy.  History of Present Illness: This patient continues to have a remarkable recovery.  Is not having any chest pain or shortness of breath.  He has cardiomyopathy.  His blood pressure historically has been low, but I was able to start low dose ACE inhibitor recently.  He is tolerating this and he returns today for followup.  Current Medications (verified): 1)  Nizatidine 150 Mg  Caps (Nizatidine) .... Two Times A Day 2)  Coreg 3.125 Mg Tabs (Carvedilol) .... Take 1 Tablet By Mouth Two Times A Day 3)  Furosemide 40 Mg Tabs (Furosemide) .... Take 1 Tablet By Mouth Once A Day 4)  Bufpirin 325 Mg Tabs (Aspirin Buffered) .... Take 1 Tablet By Mouth Once A Day 5)  Nitroglycerin 0.4 Mg Subl (Nitroglycerin) .... As Needed 6)  Advair Diskus 500-50 Mcg/dose Misc (Fluticasone-Salmeterol) .... Inhaled Two Times A Day 7)  Amoxicillin 500 Mg Caps (Amoxicillin) .... 4 Caps Before Dental Appts 8)  Ramipril 2.5 Mg Caps (Ramipril) .... Take One Capsule By Mouth Daily  Allergies (verified): 1)  ! Coumadin  Past History:  Past Medical History: MYOCARDIAL INFARCTION, ACUTE (ICD-410.90)...... 10/16/ 2009 HYPERTENSION (ICD-401.9) CEREBROVASCULAR ACCIDENT (ICD-434.91)....2009 ENDOCARDITIS, BACTERIAL (ICD-421.0)....2009 COLONIC POLYPS, ADENOMATOUS (ICD-211.3) AFTERCARE, LONG-TERM USE, ANTICOAGULANTS (ICD-V58.61) ENCOUNTER FOR THERAPEUTIC DRUG MONITORING (ICD-V58.83) HYPERLIPIDEMIA (ICD-272.4) COPD (ICD-496) GERD (ICD-530.81) TRANSIENT ISCHEMIC ATTACK, HX OF (ICD-V12.50) ATRIAL FIBRILLATION (ICD-427.31) .Marland KitchenMarland KitchenCould not have Maze at time of MVR... AV node ablation Cardiomyopathy---Ischemic...hypotension from ACE inhibitor  / low dose ACE inhibitor started January, 2010 EF 30%... echo.... July 06, 2009.Marland Kitchen..apex dyskinetic... moderate hypokinesis posterior wall and the base and mid inferior walls ASD...  closed....2010 Mitral Valve replacement  07/2008....tissue valve  /  echo... December, 2010...valve working very well ICD / pacer (AV node ablation for rapid atrial fib NO COUMADIN because of prior CNS bleed Renal atery stenosis - mild pulmonary hypertension -  moderate  Review of Systems       Patient denies fever, chills, headache, sweats, rash, change in vision, change in hearing, chest pain, shortness of breath, cough, nausea vomiting, urinary symptoms.  All other systems are reviewed and are negative.  Vital Signs:  Patient profile:   65 year old male Height:      72 inches Weight:      189 pounds BMI:     25.73 Pulse rate:   70 / minute BP sitting:   100 / 60  (left arm) Cuff size:   regular  Vitals Entered By: Burnett Kanaris, CNA (September 15, 2009 9:04 AM)  Physical Exam  General:  he looks great today. Eyes:  no xanthelasma. Neck:  no jugular venous distention. Lungs:  lungs are clear.  Respiratory effort is not labored. Heart:  cardiac exam reveals an S1 and S2.  There is no mitral regurgitation. Abdomen:  abdomen is soft. Msk:  no musculoskeletal deformities. Extremities:  no peripheral edema. Psych:  patient is oriented to person time and place.  Affect is normal.    ICD Specifications Following MD:  Lewayne Bunting, MD     ICD Vendor:  [employer] Jude     ICD Model Number:  [id number]     ICD Serial Number:  [id number] ICD DOI:  07/23/2008     ICD Implanting MD:  Sherryl Manges, MD  Lead 1:    Location: RV     DOI: 07/23/2008     Model #: [id number]  Serial #: [id number]     Status: active Lead 2:    Location: [facility]     DOI: 07/23/2008     Model #: [id number]     Serial #: [id number]     Status: active  ICD Follow Up ICD Dependent:  Yes      Episodes Coumadin:  No  Brady Parameters Mode VVIR     Lower Rate Limit:  70     Upper Rate Limit 110  Tachy Zones VF:  222     VT:  200     VT1:  162     Impression & Recommendations:  Problem # 1:  MITRAL VALVE REPLACEMENT, HX OF (ICD-V15.1) The  patient continues to do extremely well after his mitral valve replacement.  Problem # 2:  CARDIOMYOPATHY (ICD-425.4)  His updated medication list for this problem includes:    Coreg 3.125 Mg Tabs (Carvedilol) .Marland Kitchen... Take 1 tablet by mouth two times a day    Furosemide 40 Mg Tabs (Furosemide) .Marland Kitchen... Take 1 tablet by mouth once a day    Bufpirin 325 Mg Tabs (Aspirin buffered) .Marland Kitchen... Take 1 tablet by mouth once a day    Nitroglycerin 0.4 Mg Subl (Nitroglycerin) .Marland Kitchen... As needed    Ramipril 2.5 Mg Caps (Ramipril) .Marland Kitchen... Take one capsule by mouth daily  I was able to successfully add low dose of an ACE inhibitor.  He is tolerating this.  Blood pressure is borderline today.  We will not push the dose higher.  Problem # 3:  ATRIAL FIBRILLATION (ICD-427.31)  His updated medication list for this problem includes:    Coreg 3.125 Mg Tabs (Carvedilol) .Marland Kitchen... Take 1 tablet by mouth two times a day    Bufpirin 325 Mg Tabs (Aspirin buffered) .Marland Kitchen... Take 1 tablet by mouth once a day Rhythm is regular on physical exam today.  Followup in 6 months.  Patient Instructions: 1)  Follow up in 6 months

## 2010-08-10 NOTE — Assessment & Plan Note (Signed)
Summary: 1 WEEK FUP PER DR//CCM   Vital Signs:  Patient profile:   65 year old male Height:      72 inches Weight:      200 pounds O2 Sat:      96 % on Room air Temp:     97.7 degrees F oral Pulse rate:   77 / minute BP sitting:   98 / 64  (left arm) Cuff size:   regular  Vitals Entered By: Kathlene November LPN (June 14, 2010 10:51 AM)  O2 Flow:  Room air CC: followup low BP   Primary Care Provider:  Roderick Pee MD  CC:  followup low BP.  History of Present Illness: [name] is a 65 year old male, who comes in today for follow-up of hypo-tension.  He was having episodes of lightheadedness when he stood up.  We decreased his beta blocker by 50% and his symptoms now abated.  Blood pressure remains in the 115 to 120 range systolic with a diastolic between 60 and 70.  No cardiac symptoms  Current Medications (verified): 1)  Nizatidine 150 Mg  Caps (Nizatidine) .... Two Times A Day 2)  Coreg 3.125 Mg Tabs (Carvedilol) .... Take 1 Tablet By Mouth Once A Day in The Mornings 3)  Furosemide 40 Mg Tabs (Furosemide) .... Take 1 Tablet By Mouth Once A Day 4)  Bufpirin 325 Mg Tabs (Aspirin Buffered) .... Take 1 Tablet By Mouth Once A Day 5)  Nitroglycerin 0.4 Mg Subl (Nitroglycerin) .... As Needed 6)  Advair Diskus 500-50 Mcg/dose Misc (Fluticasone-Salmeterol) .... Inhaled Two Times A Day 7)  Ramipril 2.5 Mg Caps (Ramipril) .... Take One Capsule By Mouth Daily  Allergies (verified): 1)  ! Coumadin  Comments:  Nurse/Medical Assistant: The patient's medications and allergies were reviewed with the patient and were updated in the Medication and Allergy Lists. Kathlene November LPN (June 14, 2010 10:52 AM)  Past History:  Past medical, surgical, family and social histories (including risk factors) reviewed for relevance to current acute and chronic problems.  Past Medical History: Reviewed history from 03/11/2010 and no changes required. MYOCARDIAL INFARCTION, ACUTE  (ICD-410.90)...... 10/16/ 2009 HYPERTENSION (ICD-401.9) CEREBROVASCULAR ACCIDENT (ICD-434.91)....2009 ENDOCARDITIS, BACTERIAL (ICD-421.0)....2009 COLONIC POLYPS, ADENOMATOUS (ICD-211.3) AFTERCARE, LONG-TERM USE, ANTICOAGULANTS (ICD-V58.61) ENCOUNTER FOR THERAPEUTIC DRUG MONITORING (ICD-V58.83) HYPERLIPIDEMIA (ICD-272.4) COPD (ICD-496) GERD (ICD-530.81) TRANSIENT ISCHEMIC ATTACK, HX OF (ICD-V12.50) ATRIAL FIBRILLATION (ICD-427.31) .Marland KitchenMarland KitchenCould not have Maze at time of MVR... AV node ablation Cardiomyopathy---Ischemic...hypotension from ACE inhibitor  / low dose ACE inhibitor started January, 2010 EF 30%... echo.... July 06, 2009.Marland Kitchen..apex dyskinetic... moderate hypokinesis posterior wall and the base and mid inferior walls ASD... closed....2010 Mitral Valve replacement  07/2008....tissue valve  /  echo... December, 2010...valve working very well ICD / pacer (AV node ablation for rapid atrial fib NO COUMADIN because of prior CNS bleed Renal atery stenosis - mild pulmonary hypertension -  moderate  Past Surgical History: Reviewed history from 10/23/2008 and no changes required. PENILE IMPLANT Appendectomy BX MASS L NECK  loop recorder 05/23/01   Explantation of an expired loop recorder.11/10/2007  Right thoracotomy for mitral valve replacement  07/17/2008   Family History: Reviewed history from 08/13/2008 and no changes required. cancer- stomahc - father.  Family History of Stroke F 1st degree relative <60  Social History: Reviewed history from 08/13/2008 and no changes required. Former Smoker quit 2009 Married Alcohol use-no  Review of Systems      See HPI  Physical Exam  General:  Well-developed,well-nourished,in no acute distress; alert,appropriate  and cooperative throughout examination   Impression & Recommendations:  Problem # 1:  HYPERTENSION, BENIGN (ICD-401.1) Assessment Improved  His updated medication list for this problem includes:    Coreg 3.125 Mg Tabs  (Carvedilol) .Marland Kitchen... Take 1 tablet by mouth once a day in the mornings    Furosemide 40 Mg Tabs (Furosemide) .Marland Kitchen... Take 1 tablet by mouth once a day    Ramipril 2.5 Mg Caps (Ramipril) .Marland Kitchen... Take one capsule by mouth daily  Complete Medication List: 1)  Nizatidine 150 Mg Caps (Nizatidine) .... Two times a day 2)  Coreg 3.125 Mg Tabs (Carvedilol) .... Take 1 tablet by mouth once a day in the mornings 3)  Furosemide 40 Mg Tabs (Furosemide) .... Take 1 tablet by mouth once a day 4)  Bufpirin 325 Mg Tabs (Aspirin buffered) .... Take 1 tablet by mouth once a day 5)  Nitroglycerin 0.4 Mg Subl (Nitroglycerin) .... As needed 6)  Advair Diskus 500-50 Mcg/dose Misc (Fluticasone-salmeterol) .... Inhaled two times a day 7)  Ramipril 2.5 Mg Caps (Ramipril) .... Take one capsule by mouth daily  Patient Instructions: 1)  continue current medications return p.r.n.   Orders Added: 1)  Est. Patient Level III [16109]

## 2010-08-10 NOTE — Assessment & Plan Note (Signed)
Summary: CPX//CCM/PT REQ TO GET A FLU SHOT/CJR   Vital Signs:  Patient profile:   65 year old male Height:      72 inches Weight:      200 pounds Temp:     97.6 degrees F oral BP sitting:   118 / 74  (left arm) Cuff size:   regular  Vitals Entered By: Kern Reap CMA Duncan Dull) (April 12, 2010 1:53 PM) CC: cpx Is Patient Diabetic? No Pain Assessment Patient in pain? no        Primary Care Provider:  Roderick Pee MD  CC:  cpx.  History of Present Illness: [name] is a 65 year old, married male ex smoker, who comes in today for a general physical examination  He has a history of underlying heart failure and atrial fibrillation is currently in sinus rhythm with a defibrillator.  He's on Corag 3.125 mg b.i.d., Lasix, 40 mg daily, ramapril , 2.5 mg daily, BP 118/74.  He states he is not lightheaded when he stands up.  He also takes an aspirin tablet daily.  He has underlying COPD from smoking.  He takes Advair 500 -- 50........ two puffs b.i.d..  His hearing is getting worse.  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  He has hearing aids.  The purchase through the [place] however, they do not improve.  His hearing  He also has a history of a mitral valve replacement renal artery stenosis ASD repair, myocardial infarction, and numerous other problems as outlined in this problem list.  He states he is otherwise asymptomatic and feels well now.  History GI care, dental care, colonoscopy, normal, that is 2002, Pneumovax 2010, seasonal flu shot today  Allergies: 1)  ! Coumadin  Past History:  Past medical, surgical, family and social histories (including risk factors) reviewed, and no changes noted (except as noted below).  Past Medical History: Reviewed history from 03/11/2010 and no changes required. MYOCARDIAL INFARCTION, ACUTE (ICD-410.90)...... 10/16/ 2009 HYPERTENSION (ICD-401.9) CEREBROVASCULAR ACCIDENT (ICD-434.91)....2009 ENDOCARDITIS, BACTERIAL  (ICD-421.0)....2009 COLONIC POLYPS, ADENOMATOUS (ICD-211.3) AFTERCARE, LONG-TERM USE, ANTICOAGULANTS (ICD-V58.61) ENCOUNTER FOR THERAPEUTIC DRUG MONITORING (ICD-V58.83) HYPERLIPIDEMIA (ICD-272.4) COPD (ICD-496) GERD (ICD-530.81) TRANSIENT ISCHEMIC ATTACK, HX OF (ICD-V12.50) ATRIAL FIBRILLATION (ICD-427.31) .Marland KitchenMarland KitchenCould not have Maze at time of MVR... AV node ablation Cardiomyopathy---Ischemic...hypotension from ACE inhibitor  / low dose ACE inhibitor started January, 2010 EF 30%... echo.... July 06, 2009.Marland Kitchen..apex dyskinetic... moderate hypokinesis posterior wall and the base and mid inferior walls ASD... closed....2010 Mitral Valve replacement  07/2008....tissue valve  /  echo... December, 2010...valve working very well ICD / pacer (AV node ablation for rapid atrial fib NO COUMADIN because of prior CNS bleed Renal atery stenosis - mild pulmonary hypertension -  moderate  Past Surgical History: Reviewed history from 10/23/2008 and no changes required. PENILE IMPLANT Appendectomy BX MASS L NECK  loop recorder 05/23/01   Explantation of an expired loop recorder.11/10/2007  Right thoracotomy for mitral valve replacement  07/17/2008   Family History: Reviewed history from 08/13/2008 and no changes required. cancer- stomahc - father.  Family History of Stroke F 1st degree relative <60  Social History: Reviewed history from 08/13/2008 and no changes required. Former Smoker quit 2009 Married Alcohol use-no  Review of Systems      See HPI  Physical Exam  General:  Well-developed,well-nourished,in no acute distress; alert,appropriate and cooperative throughout examination Head:  Normocephalic and atraumatic without obvious abnormalities. No apparent alopecia or balding. Eyes:  No corneal or conjunctival inflammation noted. EOMI. Perrla. Funduscopic exam  benign, without hemorrhages, exudates or papilledema. Vision grossly normal. Ears:  ear canals are normal.  The, eardrums appear  normal.  Almost no hearing at all in his right ear some in his left Nose:  External nasal examination shows no deformity or inflammation. Nasal mucosa are pink and moist without lesions or exudates. Mouth:  Oral mucosa and oropharynx without lesions or exudates.  Teeth in good repair. Neck:  No deformities, masses, or tenderness noted. Chest Wall:  No deformities, masses, tenderness or gynecomastia noted. Breasts:  No masses or gynecomastia noted Lungs:  Normal respiratory effort, chest expands symmetrically. Lungs are clear to auscultation, no crackles or wheezes. Heart:  Normal rate and regular rhythm. S1 and S2 normal without gallop, murmur, click, rub or other extra sounds. Abdomen:  Bowel sounds positive,abdomen soft and non-tender without masses, organomegaly or hernias noted. Rectal:  No external abnormalities noted. Normal sphincter tone. No rectal masses or tenderness. Genitalia:  Testes bilaterally descended without nodularity, tenderness or masses. No scrotal masses or lesions. No penis lesions or urethral discharge. Prostate:  Prostate gland firm and smooth, no enlargement, nodularity, tenderness, mass, asymmetry or induration. Msk:  No deformity or scoliosis noted of thoracic or lumbar spine.   Pulses:  R and L carotid,radial,femoral,dorsalis pedis and posterior tibial pulses are full and equal bilaterally Extremities:  No clubbing, cyanosis, edema, or deformity noted with normal full range of motion of all joints.   Neurologic:  No cranial nerve deficits noted. Station and gait are normal. Plantar reflexes are down-going bilaterally. DTRs are symmetrical throughout. Sensory, motor and coordinative functions appear intact. Skin:  skin exam normal except for scar in the left neck from previous skin cancer removal and scar on the anterior right chest wall, where he has an implantable defibrillator Cervical Nodes:  No lymphadenopathy noted Axillary Nodes:  No palpable  lymphadenopathy Inguinal Nodes:  No significant adenopathy Psych:  Cognition and judgment appear intact. Alert and cooperative with normal attention span and concentration. No apparent delusions, illusions, hallucinations   Impression & Recommendations:  Problem # 1:  HYPERTENSION, BENIGN (ICD-401.1) Assessment Improved  His updated medication list for this problem includes:    Coreg 3.125 Mg Tabs (Carvedilol) .Marland Kitchen... Take 1 tablet by mouth two times a day    Furosemide 40 Mg Tabs (Furosemide) .Marland Kitchen... Take 1 tablet by mouth once a day    Ramipril 2.5 Mg Caps (Ramipril) .Marland Kitchen... Take one capsule by mouth daily  Problem # 2:  HYPERLIPIDEMIA (ICD-272.4) Assessment: Improved  Problem # 3:  COPD (ICD-496) Assessment: Unchanged  His updated medication list for this problem includes:    Advair Diskus 500-50 Mcg/dose Misc (Fluticasone-salmeterol) ..... Inhaled two times a day  Problem # 4:  Preventive Health Care (ICD-V70.0) Assessment: Unchanged  Complete Medication List: 1)  Nizatidine 150 Mg Caps (Nizatidine) .... Two times a day 2)  Coreg 3.125 Mg Tabs (Carvedilol) .... Take 1 tablet by mouth two times a day 3)  Furosemide 40 Mg Tabs (Furosemide) .... Take 1 tablet by mouth once a day 4)  Bufpirin 325 Mg Tabs (Aspirin buffered) .... Take 1 tablet by mouth once a day 5)  Nitroglycerin 0.4 Mg Subl (Nitroglycerin) .... As needed 6)  Advair Diskus 500-50 Mcg/dose Misc (Fluticasone-salmeterol) .... Inhaled two times a day 7)  Amoxicillin 500 Mg Caps (Amoxicillin) .... 4 caps before dental appts 8)  Ramipril 2.5 Mg Caps (Ramipril) .... Take one capsule by mouth daily  Other Orders: Flu Vaccine 46yrs + (16109) Admin 1st Vaccine (  16109)  Patient Instructions: 1)  continue current medications return in one year for follow-up Prescriptions: RAMIPRIL 2.5 MG CAPS (RAMIPRIL) Take one capsule by mouth daily  #100 x 3   Entered and Authorized by:   Roderick Pee MD   Signed by:   Roderick Pee  MD on 04/12/2010   Method used:   Electronically to        [facility]. [phone]* (retail)       [address]       [place], [place]  [address]       Ph: [phone] or [phone]       Fax: [phone]   RxID:   [id number] ADVAIR DISKUS 500-50 MCG/DOSE MISC (FLUTICASONE-SALMETEROL) inhaled two times a day  #3 x 3   Entered and Authorized by:   Roderick Pee MD   Signed by:   Roderick Pee MD on 04/12/2010   Method used:   Electronically to        [facility] [phone]* (retail)       [address]       [place], [place]  [address]       Ph: [phone] or [phone]       Fax: [phone]   RxID:   [id number] NITROGLYCERIN 0.4 MG SUBL (NITROGLYCERIN) as needed  #25 x 1   Entered and Authorized by:   Roderick Pee MD   Signed by:   Roderick Pee MD on 04/12/2010   Method used:   Electronically to        [facility] [phone]* (retail)       [address] [place]  [address]       Ph: [phone] or [phone]       Fax: [phone]   RxID:   [id number] FUROSEMIDE 40 MG TABS (FUROSEMIDE) Take 1 tablet by mouth once a day  #100 x 3   Entered and Authorized by:   Roderick Pee MD   Signed by:   Roderick Pee MD on 04/12/2010   Method used:   Electronically to        [facility] [phone]* (retail)       [address], [place]  [address]       Ph: [phone] or [phone]       Fax: [phone]   RxID:   [id number] COREG 3.125 MG TABS (CARVEDILOL) Take 1 tablet by mouth two times a day  #200 Tablet x 3   Entered and Authorized by:   Roderick Pee MD   Signed by:   Roderick Pee MD on 04/12/2010   Method used:   Electronically to        [facility] [phone]* (retail)       [address] [place]  [address]       Ph: [phone] or [phone]       Fax: [phone]   RxID:   [id number] NIZATIDINE 150 MG  CAPS (NIZATIDINE) two times a day  #200 x 3   Entered and Authorized by:   Roderick Pee MD   Signed by:   Roderick Pee MD on 04/12/2010   Method used:   [facility]        [facility] [place] [phone]* (retail)       [phone], [place]  [address]       Ph: [phone] or [phone]       Fax: [phone]   RxID:   [id number]    Immunizations Administered:  Influenza Vaccine # 1:    Vaccine Type: Fluvax 3+    Site: left deltoid    Mfr: [employer]Smith[employer]    Dose: 0.5 ml    Route: IM    Given by: Kern Reap CMA (AAMA)    Exp. Date: 01/08/2011    Lot #: [id number]    Physician counseled: yes

## 2010-08-12 NOTE — Assessment & Plan Note (Signed)
Summary: Charles jude   Visit Type:  Follow-up Primary Provider:  Roderick Pee MD   History of Present Illness: [name] returns today for followup.  He is a pleasant middle aged man with coronary artery disease, ischemic cardiomyopathy, bacterial endocarditis, mitral valve replacement, atrial fibrillation with a rapid rate, AV nodal ablation, s/p BiV  ICD placement.  The patient has not received Coumadin because of a prior significant CNS bleed.  Patient also had a TIA in the past. Patient is doing remarkably well. He denies c/p, sob, or peripheral edema. No intercurrent ICD therapies.   Current Medications (verified): 1)  Nizatidine 150 Mg  Caps (Nizatidine) .... Two Times A Day 2)  Coreg 3.125 Mg Tabs (Carvedilol) .... Take 1 Tablet By Mouth Once A Day in The Mornings 3)  Furosemide 40 Mg Tabs (Furosemide) .... Take 1 Tablet By Mouth Once A Day 4)  Bufpirin 325 Mg Tabs (Aspirin Buffered) .... Take 1 Tablet By Mouth Once A Day 5)  Nitroglycerin 0.4 Mg Subl (Nitroglycerin) .... As Needed 6)  Advair Diskus 500-50 Mcg/dose Misc (Fluticasone-Salmeterol) .... Inhaled Two Times A Day 7)  Ramipril 2.5 Mg Caps (Ramipril) .... Take One Capsule By Mouth Daily 8)  Amoxicillin 500 Mg Caps (Amoxicillin) .... 4 Tabs Before Dental Work  Allergies: 1)  ! Coumadin  Past History:  Past Medical History: Last updated: 03/11/2010 MYOCARDIAL INFARCTION, ACUTE (ICD-410.90)...... 10/16/ 2009 HYPERTENSION (ICD-401.9) CEREBROVASCULAR ACCIDENT (ICD-434.91)....2009 ENDOCARDITIS, BACTERIAL (ICD-421.0)....2009 COLONIC POLYPS, ADENOMATOUS (ICD-211.3) AFTERCARE, LONG-TERM USE, ANTICOAGULANTS (ICD-V58.61) ENCOUNTER FOR THERAPEUTIC DRUG MONITORING (ICD-V58.83) HYPERLIPIDEMIA (ICD-272.4) COPD (ICD-496) GERD (ICD-530.81) TRANSIENT ISCHEMIC ATTACK, HX OF (ICD-V12.50) ATRIAL FIBRILLATION (ICD-427.31) .Marland KitchenMarland KitchenCould not have Maze at time of MVR... AV node ablation Cardiomyopathy---Ischemic...hypotension from ACE inhibitor   / low dose ACE inhibitor started January, 2010 EF 30%... echo.... July 06, 2009.Marland Kitchen..apex dyskinetic... moderate hypokinesis posterior wall and the base and mid inferior walls ASD... closed....2010 Mitral Valve replacement  07/2008....tissue valve  /  echo... December, 2010...valve working very well ICD / pacer (AV node ablation for rapid atrial fib NO COUMADIN because of prior CNS bleed Renal atery stenosis - mild pulmonary hypertension -  moderate  Past Surgical History: Last updated: 10/23/2008 PENILE IMPLANT Appendectomy BX MASS L NECK  loop recorder 05/23/01   Explantation of an expired loop recorder.11/10/2007  Right thoracotomy for mitral valve replacement  07/17/2008   Review of Systems  The patient denies chest pain, syncope, dyspnea on exertion, and peripheral edema.    Vital Signs:  Patient profile:   65 year old male Height:      72 inches Weight:      196 pounds BMI:     26.68 Pulse rate:   76 / minute BP sitting:   102 / 62  (left arm)  Vitals Entered By: Laurance Flatten CMA (August 03, 2010 11:36 AM)  Physical Exam  General:  Well-developed,well-nourished,in no acute distress; alert,appropriate and cooperative throughout examination Head:  Normocephalic and atraumatic without obvious abnormalities. No apparent alopecia or balding. Mouth:  Oral mucosa and oropharynx without lesions or exudates.  Teeth in good repair. Neck:  No deformities, masses, or tenderness noted. Chest Wall:  Well healed ICD incision. Lungs:  Normal respiratory effort, chest expands symmetrically. Lungs are clear to auscultation, no crackles or wheezes. Heart:  Normal rate and regular rhythm. S1 and S2 normal without gallop, murmur, click, rub or other extra sounds. Abdomen:  Bowel sounds positive,abdomen soft and non-tender without masses, organomegaly or hernias noted. Msk:  No deformity or  scoliosis noted of thoracic or lumbar spine.   Pulses:  R and L carotid,radial,femoral,dorsalis  pedis and posterior tibial pulses are full and equal bilaterally Extremities:  No clubbing, cyanosis, edema, or deformity noted with normal full range of motion of all joints.   Neurologic:  No cranial nerve deficits noted. Station and gait are normal. Plantar reflexes are down-going bilaterally. DTRs are symmetrical throughout. Sensory, motor and coordinative functions appear intact.    ICD Specifications Following MD:  Lewayne Bunting, MD     ICD Vendor:  [facility]     ICD Model Number:  [id number]     ICD Serial Number:  [id number] ICD DOI:  07/23/2008     ICD Implanting MD:  Sherryl Manges, MD  Lead 1:    Location: RV     DOI: 07/23/2008     Model #: [id number]     Serial #: [id number]     Status: active Lead 2:    Location: LV     DOI: 07/23/2008     Model #: [id number]     Serial #: [id number]     Status: active  ICD Follow Up Remote Check?  No Battery Voltage:  3.08 V     Charge Time:  11.3 seconds     Battery Est. Longevity:  4.5 years Underlying rhythm:  Brady ICD Dependent:  Yes       ICD Device Measurements Right Ventricle:  Amplitude: 11.7 mV, Impedance: 530 ohms, Threshold: 0.75 V at 0.5 msec Left Ventricle:  Impedance: 1050 ohms, Threshold: 0.75 V at 0.8 msec Shock Impedance: 77 ohms   Episodes Coumadin:  No Shock:  0     ATP:  0     Nonsustained:  0     Ventricular Pacing:  100%  Brady Parameters Mode VVIR     Lower Rate Limit:  70     Upper Rate Limit 110 Rate Response Parameters:  Threshold Auto (-0.5).  Tachy Zones VF:  222     VT:  200     VT1:  162     Next Remote Date:  11/04/2010     Next Cardiology Appt Due:  07/12/2011 Tech Comments:  Sensor reprogrammed as above for blunted rate response.Arsenio Katz transmissions every 3 months.  ROV 1 year with Dr. Ladona Ridgel. Altha Harm, LPN  August 03, 2010 11:34 AM  MD Comments:  Agree with above.  Impression & Recommendations:  Problem # 1:  AUTOMATIC IMPLANTABLE CARDIAC DEFIBRILLATOR SITU (ICD-V45.02) His device is working normally. Will  recheck in several months.  Problem # 2:  CHRONIC SYSTOLIC HEART FAILURE (ICD-428.22) His current symptoms are class 1-2. He will continue a low sodium diet. Continue meds as below. His updated medication list for this problem includes:    Coreg 3.125 Mg Tabs (Carvedilol) .Marland Kitchen... Take 1 tablet by mouth once a day in the mornings    Furosemide 40 Mg Tabs (Furosemide) .Marland Kitchen... Take 1 tablet by mouth once a day    Bufpirin 325 Mg Tabs (Aspirin buffered) .Marland Kitchen... Take 1 tablet by mouth once a day    Nitroglycerin 0.4 Mg Subl (Nitroglycerin) .Marland Kitchen... As needed    Ramipril 2.5 Mg Caps (Ramipril) .Marland Kitchen... Take one capsule by mouth daily  Patient Instructions: 1)  Your physician wants you to follow-up in: 12 months with  You will receive a reminder letter in the mail two months in advance. If you don't receive a letter, please call our office to schedule the follow-up appointment.

## 2010-08-13 NOTE — Miscellaneous (Signed)
Summary: [facility]: Verbal Order  [facility]: Verbal Order   Imported By: Florinda Marker 07/01/2010 10:13:37  _____________________________________________________________________  External Attachment:    Type:   Image     Comment:   External Document

## 2010-08-26 NOTE — Cardiovascular Report (Signed)
Summary: Office Visit   Office Visit   Imported By: Roderic Ovens 08/17/2010 16:14:01  _____________________________________________________________________  External Attachment:    Type:   Image     Comment:   External Document

## 2010-09-03 ENCOUNTER — Other Ambulatory Visit: Payer: Self-pay | Admitting: Dermatology

## 2010-09-17 ENCOUNTER — Ambulatory Visit (INDEPENDENT_AMBULATORY_CARE_PROVIDER_SITE_OTHER): Payer: Federal, State, Local not specified - PPO | Admitting: Cardiology

## 2010-09-17 ENCOUNTER — Encounter: Payer: Self-pay | Admitting: Cardiology

## 2010-09-17 DIAGNOSIS — I4891 Unspecified atrial fibrillation: Secondary | ICD-10-CM

## 2010-09-17 DIAGNOSIS — I059 Rheumatic mitral valve disease, unspecified: Secondary | ICD-10-CM

## 2010-09-21 LAB — BASIC METABOLIC PANEL
BUN: 20 mg/dL (ref 6–23)
CO2: 26 mEq/L (ref 19–32)
Calcium: 9.1 mg/dL (ref 8.4–10.5)
Chloride: 103 mEq/L (ref 96–112)
Creatinine, Ser: 0.97 mg/dL (ref 0.4–1.5)
GFR calc Af Amer: >60 mL/min (ref >60)
GFR calc non Af Amer: >60 mL/min (ref >60)
Glucose, Bld: 120 mg/dL — ABNORMAL HIGH (ref 70–99)
Potassium: 4.3 mEq/L (ref 3.5–5.1)
Sodium: 139 mEq/L (ref 135–145)

## 2010-09-21 LAB — POCT CARDIAC MARKERS
CKMB, poc: <1 ng/mL — ABNORMAL LOW (ref 1.0–8.0)
Troponin i, poc: <0.05 ng/mL (ref 0.00–0.09)

## 2010-09-21 LAB — CBC
HCT: 47.9 % (ref 39.0–52.0)
HCT: 48.5 % (ref 39.0–52.0)
Hemoglobin: 16.6 g/dL (ref 13.0–17.0)
Hemoglobin: 16.9 g/dL (ref 13.0–17.0)
MCH: 32.4 pg (ref 26.0–34.0)
MCV: 93.1 fL (ref 78.0–100.0)
MCV: 93.6 fL (ref 78.0–100.0)
Platelets: 160 10*3/uL (ref 150–400)
RBC: 5.12 MIL/uL (ref 4.22–5.81)
RBC: 5.21 MIL/uL (ref 4.22–5.81)
RDW: 13.7 % (ref 11.5–15.5)
WBC: 7.5 10*3/uL (ref 4.0–10.5)
WBC: 8.5 10*3/uL (ref 4.0–10.5)

## 2010-09-21 LAB — DIFFERENTIAL
Basophils Absolute: 0.1 10*3/uL (ref 0.0–0.1)
Eosinophils Relative: 2 % (ref 0–5)
Lymphocytes Relative: 13 % (ref 12–46)
Lymphs Abs: 1 10*3/uL (ref 0.7–4.0)
Monocytes Absolute: 0.8 10*3/uL (ref 0.1–1.0)
Neutro Abs: 5.5 10*3/uL (ref 1.7–7.7)

## 2010-09-28 NOTE — Assessment & Plan Note (Signed)
Summary: follow up from 03/11/10/   Visit Type:  Follow-up Primary Provider:  Roderick Pee MD  CC:  atrial fibrillation and mitral valve replacement.  History of Present Illness: The patient is seen for cardiology followup.  He continues to do remarkably well.  His history is very complex.  Fortunately he is recovered from his CVA and his endocarditis.  He has atrial fibrillation and cannot be anticoagulated because of his prior CNS bleed.  He could not have a Maze procedure at the time of his mitral valve replacement.  He hasn't had an AV node ablation.  Current Medications (verified): 1)  Nizatidine 150 Mg  Caps (Nizatidine) .... Two Times A Day 2)  Coreg 3.125 Mg Tabs (Carvedilol) .... Take 1 Tablet By Mouth Once A Day in The Mornings 3)  Furosemide 40 Mg Tabs (Furosemide) .... Take 1 Tablet By Mouth Once A Day 4)  Bufpirin 325 Mg Tabs (Aspirin Buffered) .... Take 1 Tablet By Mouth Once A Day 5)  Nitroglycerin 0.4 Mg Subl (Nitroglycerin) .... As Needed 6)  Advair Diskus 500-50 Mcg/dose Misc (Fluticasone-Salmeterol) .... Inhaled Two Times A Day 7)  Ramipril 2.5 Mg Caps (Ramipril) .... Take One Capsule By Mouth Daily 8)  Amoxicillin 500 Mg Caps (Amoxicillin) .... 4 Tabs Before Dental Work  Allergies: 1)  ! Coumadin  Past History:  Past Medical History: MYOCARDIAL INFARCTION, ACUTE (ICD-410.90)...... 10/16/ 2009 HYPERTENSION (ICD-401.9) CEREBROVASCULAR ACCIDENT (ICD-434.91)....2009 ENDOCARDITIS, BACTERIAL (ICD-421.0)....2009 COLONIC POLYPS, ADENOMATOUS (ICD-211.3) AFTERCARE, LONG-TERM USE, ANTICOAGULANTS (ICD-V58.61) ENCOUNTER FOR THERAPEUTIC DRUG MONITORING (ICD-V58.83) HYPERLIPIDEMIA (ICD-272.4) COPD (ICD-496).Marland Kitchen GERD (ICD-530.81) TRANSIENT ISCHEMIC ATTACK, HX OF (ICD-V12.50) ATRIAL FIBRILLATION (ICD-427.31) .Marland KitchenMarland KitchenCould not have Maze at time of MVR... AV node ablation Cardiomyopathy---Ischemic...hypotension from ACE inhibitor  / low dose ACE inhibitor started January,  2010 EF 30%... echo.... July 06, 2009.Marland Kitchen..apex dyskinetic... moderate hypokinesis posterior wall and the base and mid inferior walls ASD... closed....2010 Mitral Valve replacement  07/2008....tissue valve  /  echo... December, 2010...valve working very well ICD / pacer (AV node ablation for rapid atrial fib NO COUMADIN because of prior CNS bleed Renal atery stenosis - mild pulmonary hypertension -  moderate  Review of Systems       Patient denies fever, chills, headache, sweats, rash, she vision, change in hearing, chest pain, cough, nausea vomiting, urinary symptoms.  All other systems are reviewed and are negative.  Vital Signs:  Patient profile:   65 year old male Height:      72 inches Weight:      202 pounds BMI:     27.50 Pulse rate:   71 / minute Resp:     14 per minute BP sitting:   108 / 66  (left arm)  Vitals Entered By: Kem Parkinson (September 17, 2010 10:36 AM)  Physical Exam  General:  patient is stable. Eyes:  no xanthelasma. Neck:  no jugular venous stent. Lungs:  lungs are clear.  Respiratory effort is nonlabored. Heart:  cardiac exam reveals S1-S2.  There no significant murmurs. Abdomen:  abdomen is soft. Extremities:  no peripheral edema. Psych:  patient is oriented to person time and place.  Affect is normal.    ICD Specifications Following MD:  Lewayne Bunting, MD     ICD Vendor:  St Jude     ICD Model Number:  [id number]     ICD Serial Number:  [id number] ICD DOI:  07/23/2008     ICD Implanting MD:  Sherryl Manges, MD  Lead 1:    Location:  RV     DOI: 07/23/2008     Model #: [id number]     Serial #: [id number]     Status: active Lead 2:    Location: LV     DOI: 07/23/2008     Model #: [id number]     Serial #: [id number]     Status: active  ICD Follow Up ICD Dependent:  Yes      Episodes Coumadin:  No  Brady Parameters Mode VVIR     Lower Rate Limit:  70     Upper Rate Limit 110 Rate Response Parameters:  Threshold Auto (-0.5).  Tachy Zones VF:  222     VT:  200      VT1:  162     Impression & Recommendations:  Problem # 1:  CHRONIC SYSTOLIC HEART FAILURE (ICD-428.22) The patient has no significant signs or symptoms of heart failure.  Problem # 2:  MITRAL VALVE REPLACEMENT, HX OF (ICD-V15.1) He has a tissue mitral valve replacement.  He is stable.  Problem # 3:  SYNCOPE (ICD-780.2) There's been no recurrent syncope.  Problem # 4:  AUTOMATIC IMPLANTABLE CARDIAC DEFIBRILLATOR SITU (ICD-V45.02) His ICD and pacemaker were quite well.  Patient is quite stable.  No change in therapy.  EKG today shows the rhythm is paced.  There is underlying atrial fib or flutter.  Other Orders: EKG w/ Interpretation (93000)  Patient Instructions: 1)  Your physician recommends that you schedule a follow-up appointment in 6 months with Dr. Myrtis Ser 2)  Your physician recommends that you continue on your current medications as directed. Please refer to the Current Medication list given to you today.

## 2010-10-25 LAB — CBC
HCT: 27.5 % — ABNORMAL LOW (ref 39.0–52.0)
HCT: 27.6 % — ABNORMAL LOW (ref 39.0–52.0)
HCT: 28 % — ABNORMAL LOW (ref 39.0–52.0)
HCT: 28.5 % — ABNORMAL LOW (ref 39.0–52.0)
HCT: 31.3 % — ABNORMAL LOW (ref 39.0–52.0)
HCT: 33.7 % — ABNORMAL LOW (ref 39.0–52.0)
HCT: 37.5 % — ABNORMAL LOW (ref 39.0–52.0)
HCT: 38.4 % — ABNORMAL LOW (ref 39.0–52.0)
HCT: 39.1 % (ref 39.0–52.0)
HCT: 25 % — ABNORMAL LOW (ref 39.0–52.0)
Hemoglobin: 10.3 g/dL — ABNORMAL LOW (ref 13.0–17.0)
Hemoglobin: 11 g/dL — ABNORMAL LOW (ref 13.0–17.0)
Hemoglobin: 12.8 g/dL — ABNORMAL LOW (ref 13.0–17.0)
Hemoglobin: 7.7 g/dL — CL (ref 13.0–17.0)
Hemoglobin: 9.2 g/dL — ABNORMAL LOW (ref 13.0–17.0)
Hemoglobin: 9.3 g/dL — ABNORMAL LOW (ref 13.0–17.0)
Hemoglobin: 9.6 g/dL — ABNORMAL LOW (ref 13.0–17.0)
Hemoglobin: 8.3 g/dL — ABNORMAL LOW (ref 13.0–17.0)
MCHC: 32.1 g/dL (ref 30.0–36.0)
MCHC: 32.5 g/dL (ref 30.0–36.0)
MCHC: 32.7 g/dL (ref 30.0–36.0)
MCHC: 32.7 g/dL (ref 30.0–36.0)
MCHC: 32.9 g/dL (ref 30.0–36.0)
MCHC: 33.1 g/dL (ref 30.0–36.0)
MCHC: 33.2 g/dL (ref 30.0–36.0)
MCHC: 33.3 g/dL (ref 30.0–36.0)
MCHC: 33.5 g/dL (ref 30.0–36.0)
MCHC: 33.4 g/dL (ref 30.0–36.0)
MCV: 91 fL (ref 78.0–100.0)
MCV: 91.7 fL (ref 78.0–100.0)
MCV: 91.7 fL (ref 78.0–100.0)
MCV: 91.7 fL (ref 78.0–100.0)
MCV: 92.2 fL (ref 78.0–100.0)
MCV: 92.4 fL (ref 78.0–100.0)
MCV: 93 fL (ref 78.0–100.0)
MCV: 93.3 fL (ref 78.0–100.0)
MCV: 93.9 fL (ref 78.0–100.0)
MCV: 90.4 fL (ref 78.0–100.0)
Platelets: 104 10*3/uL — ABNORMAL LOW (ref 150–400)
Platelets: 114 10*3/uL — ABNORMAL LOW (ref 150–400)
Platelets: 120 10*3/uL — ABNORMAL LOW (ref 150–400)
Platelets: 136 10*3/uL — ABNORMAL LOW (ref 150–400)
Platelets: 148 10*3/uL — ABNORMAL LOW (ref 150–400)
Platelets: 151 10*3/uL (ref 150–400)
Platelets: 153 10*3/uL (ref 150–400)
Platelets: 271 10*3/uL (ref 150–400)
RBC: 2.52 MIL/uL — ABNORMAL LOW (ref 4.22–5.81)
RBC: 2.96 MIL/uL — ABNORMAL LOW (ref 4.22–5.81)
RBC: 3.01 MIL/uL — ABNORMAL LOW (ref 4.22–5.81)
RBC: 3.01 MIL/uL — ABNORMAL LOW (ref 4.22–5.81)
RBC: 3.02 MIL/uL — ABNORMAL LOW (ref 4.22–5.81)
RBC: 3.06 MIL/uL — ABNORMAL LOW (ref 4.22–5.81)
RBC: 3.13 MIL/uL — ABNORMAL LOW (ref 4.22–5.81)
RBC: 3.41 MIL/uL — ABNORMAL LOW (ref 4.22–5.81)
RBC: 3.68 MIL/uL — ABNORMAL LOW (ref 4.22–5.81)
RBC: 4.12 MIL/uL — ABNORMAL LOW (ref 4.22–5.81)
RBC: 4.19 MIL/uL — ABNORMAL LOW (ref 4.22–5.81)
RBC: 2.76 MIL/uL — ABNORMAL LOW (ref 4.22–5.81)
RDW: 17.6 % — ABNORMAL HIGH (ref 11.5–15.5)
RDW: 18.8 % — ABNORMAL HIGH (ref 11.5–15.5)
RDW: 18.8 % — ABNORMAL HIGH (ref 11.5–15.5)
RDW: 18.8 % — ABNORMAL HIGH (ref 11.5–15.5)
RDW: 19 % — ABNORMAL HIGH (ref 11.5–15.5)
RDW: 17.2 % — ABNORMAL HIGH (ref 11.5–15.5)
WBC: 10 10*3/uL (ref 4.0–10.5)
WBC: 5.1 10*3/uL (ref 4.0–10.5)
WBC: 5.3 10*3/uL (ref 4.0–10.5)
WBC: 6.1 10*3/uL (ref 4.0–10.5)
WBC: 6.2 10*3/uL (ref 4.0–10.5)
WBC: 6.3 10*3/uL (ref 4.0–10.5)
WBC: 6.6 10*3/uL (ref 4.0–10.5)
WBC: 7.3 10*3/uL (ref 4.0–10.5)
WBC: 7.5 10*3/uL (ref 4.0–10.5)
WBC: 7.9 10*3/uL (ref 4.0–10.5)
WBC: 5.3 10*3/uL (ref 4.0–10.5)

## 2010-10-25 LAB — URINALYSIS, ROUTINE W REFLEX MICROSCOPIC
Protein, ur: 30 mg/dL — AB
Urobilinogen, UA: 1 mg/dL (ref 0.0–1.0)

## 2010-10-25 LAB — PREPARE FRESH FROZEN PLASMA

## 2010-10-25 LAB — HEPATIC FUNCTION PANEL
ALT: 11 U/L (ref 0–53)
AST: 17 U/L (ref 0–37)
Albumin: 3 g/dL — ABNORMAL LOW (ref 3.5–5.2)
Alkaline Phosphatase: 68 U/L (ref 39–117)
Bilirubin, Direct: 0.7 mg/dL — ABNORMAL HIGH (ref 0.0–0.3)
Indirect Bilirubin: 1.2 mg/dL — ABNORMAL HIGH (ref 0.3–0.9)
Total Bilirubin: 1.9 mg/dL — ABNORMAL HIGH (ref 0.3–1.2)
Total Protein: 6.5 g/dL (ref 6.0–8.3)

## 2010-10-25 LAB — POCT I-STAT 3, ART BLOOD GAS (G3+)
Acid-Base Excess: 3 mmol/L — ABNORMAL HIGH (ref 0.0–2.0)
Acid-Base Excess: 3 mmol/L — ABNORMAL HIGH (ref 0.0–2.0)
Acid-Base Excess: 3 mmol/L — ABNORMAL HIGH (ref 0.0–2.0)
Acid-Base Excess: 4 mmol/L — ABNORMAL HIGH (ref 0.0–2.0)
Acid-Base Excess: 4 mmol/L — ABNORMAL HIGH (ref 0.0–2.0)
Acid-Base Excess: 5 mmol/L — ABNORMAL HIGH (ref 0.0–2.0)
Acid-Base Excess: 6 mmol/L — ABNORMAL HIGH (ref 0.0–2.0)
Acid-Base Excess: 7 mmol/L — ABNORMAL HIGH (ref 0.0–2.0)
Bicarbonate: 29.3 mEq/L — ABNORMAL HIGH (ref 20.0–24.0)
Bicarbonate: 29.6 mEq/L — ABNORMAL HIGH (ref 20.0–24.0)
Bicarbonate: 29.6 mEq/L — ABNORMAL HIGH (ref 20.0–24.0)
Bicarbonate: 30.8 mEq/L — ABNORMAL HIGH (ref 20.0–24.0)
Bicarbonate: 31.5 mEq/L — ABNORMAL HIGH (ref 20.0–24.0)
Bicarbonate: 31.8 mEq/L — ABNORMAL HIGH (ref 20.0–24.0)
Bicarbonate: 32.4 mEq/L — ABNORMAL HIGH (ref 20.0–24.0)
Bicarbonate: 33.7 mEq/L — ABNORMAL HIGH (ref 20.0–24.0)
O2 Saturation: 100 %
O2 Saturation: 100 %
O2 Saturation: 73 %
O2 Saturation: 81 %
O2 Saturation: 89 %
O2 Saturation: 94 %
O2 Saturation: 97 %
O2 Saturation: 99 %
TCO2: 31 mmol/L (ref 0–100)
TCO2: 31 mmol/L (ref 0–100)
TCO2: 33 mmol/L (ref 0–100)
TCO2: 33 mmol/L (ref 0–100)
TCO2: 34 mmol/L (ref 0–100)
TCO2: 34 mmol/L (ref 0–100)
TCO2: 36 mmol/L (ref 0–100)
pCO2 arterial: 44.7 mmHg (ref 35.0–45.0)
pCO2 arterial: 50.4 mmHg — ABNORMAL HIGH (ref 35.0–45.0)
pCO2 arterial: 53.6 mmHg — ABNORMAL HIGH (ref 35.0–45.0)
pCO2 arterial: 54.7 mmHg — ABNORMAL HIGH (ref 35.0–45.0)
pCO2 arterial: 64.6 mmHg (ref 35.0–45.0)
pCO2 arterial: 67.4 mmHg (ref 35.0–45.0)
pCO2 arterial: 70 mmHg (ref 35.0–45.0)
pH, Arterial: 7.266 — ABNORMAL LOW (ref 7.350–7.450)
pH, Arterial: 7.326 — ABNORMAL LOW (ref 7.350–7.450)
pH, Arterial: 7.343 — ABNORMAL LOW (ref 7.350–7.450)
pH, Arterial: 7.35 (ref 7.350–7.450)
pH, Arterial: 7.416 (ref 7.350–7.450)
pO2, Arterial: 148 mmHg — ABNORMAL HIGH (ref 80.0–100.0)
pO2, Arterial: 221 mmHg — ABNORMAL HIGH (ref 80.0–100.0)
pO2, Arterial: 238 mmHg — ABNORMAL HIGH (ref 80.0–100.0)
pO2, Arterial: 52 mmHg — ABNORMAL LOW (ref 80.0–100.0)
pO2, Arterial: 53 mmHg — ABNORMAL LOW (ref 80.0–100.0)
pO2, Arterial: 60 mmHg — ABNORMAL LOW (ref 80.0–100.0)
pO2, Arterial: 68 mmHg — ABNORMAL LOW (ref 80.0–100.0)
pO2, Arterial: 76 mmHg — ABNORMAL LOW (ref 80.0–100.0)
pO2, Arterial: 97 mmHg (ref 80.0–100.0)

## 2010-10-25 LAB — GLUCOSE, CAPILLARY
Glucose-Capillary: 102 mg/dL — ABNORMAL HIGH (ref 70–99)
Glucose-Capillary: 110 mg/dL — ABNORMAL HIGH (ref 70–99)
Glucose-Capillary: 110 mg/dL — ABNORMAL HIGH (ref 70–99)
Glucose-Capillary: 116 mg/dL — ABNORMAL HIGH (ref 70–99)
Glucose-Capillary: 117 mg/dL — ABNORMAL HIGH (ref 70–99)
Glucose-Capillary: 124 mg/dL — ABNORMAL HIGH (ref 70–99)
Glucose-Capillary: 125 mg/dL — ABNORMAL HIGH (ref 70–99)
Glucose-Capillary: 127 mg/dL — ABNORMAL HIGH (ref 70–99)
Glucose-Capillary: 129 mg/dL — ABNORMAL HIGH (ref 70–99)
Glucose-Capillary: 130 mg/dL — ABNORMAL HIGH (ref 70–99)
Glucose-Capillary: 133 mg/dL — ABNORMAL HIGH (ref 70–99)
Glucose-Capillary: 133 mg/dL — ABNORMAL HIGH (ref 70–99)
Glucose-Capillary: 134 mg/dL — ABNORMAL HIGH (ref 70–99)
Glucose-Capillary: 135 mg/dL — ABNORMAL HIGH (ref 70–99)
Glucose-Capillary: 139 mg/dL — ABNORMAL HIGH (ref 70–99)
Glucose-Capillary: 143 mg/dL — ABNORMAL HIGH (ref 70–99)
Glucose-Capillary: 144 mg/dL — ABNORMAL HIGH (ref 70–99)
Glucose-Capillary: 145 mg/dL — ABNORMAL HIGH (ref 70–99)
Glucose-Capillary: 157 mg/dL — ABNORMAL HIGH (ref 70–99)
Glucose-Capillary: 175 mg/dL — ABNORMAL HIGH (ref 70–99)
Glucose-Capillary: 176 mg/dL — ABNORMAL HIGH (ref 70–99)
Glucose-Capillary: 188 mg/dL — ABNORMAL HIGH (ref 70–99)
Glucose-Capillary: 37 mg/dL — CL (ref 70–99)
Glucose-Capillary: 44 mg/dL — ABNORMAL LOW (ref 70–99)
Glucose-Capillary: 78 mg/dL (ref 70–99)
Glucose-Capillary: 93 mg/dL (ref 70–99)

## 2010-10-25 LAB — BLOOD GAS, ARTERIAL
Acid-Base Excess: 8.5 mmol/L — ABNORMAL HIGH (ref 0.0–2.0)
Bicarbonate: 32.7 mEq/L — ABNORMAL HIGH (ref 20.0–24.0)
Bicarbonate: 34.8 mEq/L — ABNORMAL HIGH (ref 20.0–24.0)
Drawn by: 12206
O2 Content: 3 L/min
O2 Saturation: 90.5 %
Patient temperature: 98.6
TCO2: 34.1 mmol/L (ref 0–100)
TCO2: 36.4 mmol/L (ref 0–100)
pCO2 arterial: 47 mmHg — ABNORMAL HIGH (ref 35.0–45.0)
pCO2 arterial: 54.4 mmHg — ABNORMAL HIGH (ref 35.0–45.0)
pH, Arterial: 7.421 (ref 7.350–7.450)
pH, Arterial: 7.456 — ABNORMAL HIGH (ref 7.350–7.450)
pO2, Arterial: 44.5 mmHg — ABNORMAL LOW (ref 80.0–100.0)
pO2, Arterial: 57.5 mmHg — ABNORMAL LOW (ref 80.0–100.0)

## 2010-10-25 LAB — BASIC METABOLIC PANEL
BUN: 10 mg/dL (ref 6–23)
BUN: 10 mg/dL (ref 6–23)
BUN: 13 mg/dL (ref 6–23)
BUN: 22 mg/dL (ref 6–23)
BUN: 27 mg/dL — ABNORMAL HIGH (ref 6–23)
BUN: 27 mg/dL — ABNORMAL HIGH (ref 6–23)
BUN: 7 mg/dL (ref 6–23)
CO2: 31 mEq/L (ref 19–32)
CO2: 32 mEq/L (ref 19–32)
CO2: 33 mEq/L — ABNORMAL HIGH (ref 19–32)
CO2: 33 mEq/L — ABNORMAL HIGH (ref 19–32)
CO2: 35 mEq/L — ABNORMAL HIGH (ref 19–32)
CO2: 35 mEq/L — ABNORMAL HIGH (ref 19–32)
CO2: 30 mEq/L (ref 19–32)
Calcium: 7.6 mg/dL — ABNORMAL LOW (ref 8.4–10.5)
Calcium: 7.9 mg/dL — ABNORMAL LOW (ref 8.4–10.5)
Calcium: 8.3 mg/dL — ABNORMAL LOW (ref 8.4–10.5)
Calcium: 7.3 mg/dL — ABNORMAL LOW (ref 8.4–10.5)
Chloride: 103 mEq/L (ref 96–112)
Chloride: 92 mEq/L — ABNORMAL LOW (ref 96–112)
Chloride: 93 mEq/L — ABNORMAL LOW (ref 96–112)
Chloride: 93 mEq/L — ABNORMAL LOW (ref 96–112)
Chloride: 94 mEq/L — ABNORMAL LOW (ref 96–112)
Chloride: 94 mEq/L — ABNORMAL LOW (ref 96–112)
Chloride: 96 mEq/L (ref 96–112)
Chloride: 97 mEq/L (ref 96–112)
Chloride: 99 mEq/L (ref 96–112)
Creatinine, Ser: 0.6 mg/dL (ref 0.4–1.5)
Creatinine, Ser: 0.6 mg/dL (ref 0.4–1.5)
Creatinine, Ser: 0.62 mg/dL (ref 0.4–1.5)
Creatinine, Ser: 0.73 mg/dL (ref 0.4–1.5)
Creatinine, Ser: 0.8 mg/dL (ref 0.4–1.5)
Creatinine, Ser: 0.81 mg/dL (ref 0.4–1.5)
Creatinine, Ser: 0.52 mg/dL (ref 0.4–1.5)
GFR calc Af Amer: >60 mL/min (ref >60)
GFR calc Af Amer: >60 mL/min (ref >60)
GFR calc Af Amer: >60 mL/min (ref >60)
GFR calc Af Amer: >60 mL/min (ref >60)
GFR calc Af Amer: >60 mL/min (ref >60)
GFR calc non Af Amer: >60 mL/min (ref >60)
GFR calc non Af Amer: >60 mL/min (ref >60)
Glucose, Bld: 107 mg/dL — ABNORMAL HIGH (ref 70–99)
Glucose, Bld: 129 mg/dL — ABNORMAL HIGH (ref 70–99)
Glucose, Bld: 136 mg/dL — ABNORMAL HIGH (ref 70–99)
Glucose, Bld: 117 mg/dL — ABNORMAL HIGH (ref 70–99)
Potassium: 3.4 mEq/L — ABNORMAL LOW (ref 3.5–5.1)
Potassium: 3.8 mEq/L (ref 3.5–5.1)
Potassium: 3.9 mEq/L (ref 3.5–5.1)
Potassium: 4 mEq/L (ref 3.5–5.1)
Potassium: 4.3 mEq/L (ref 3.5–5.1)
Potassium: 4.5 mEq/L (ref 3.5–5.1)
Potassium: 3.9 mEq/L (ref 3.5–5.1)
Sodium: 133 mEq/L — ABNORMAL LOW (ref 135–145)
Sodium: 134 mEq/L — ABNORMAL LOW (ref 135–145)
Sodium: 134 mEq/L — ABNORMAL LOW (ref 135–145)
Sodium: 136 mEq/L (ref 135–145)

## 2010-10-25 LAB — PREPARE PLATELETS

## 2010-10-25 LAB — PROTIME-INR
INR: 1.7 — ABNORMAL HIGH (ref 0.00–1.49)
Prothrombin Time: 18.4 seconds — ABNORMAL HIGH (ref 11.6–15.2)
Prothrombin Time: 20.5 seconds — ABNORMAL HIGH (ref 11.6–15.2)

## 2010-10-25 LAB — DIFFERENTIAL
Basophils Relative: 0 % (ref 0–1)
Eosinophils Absolute: 0.1 10*3/uL (ref 0.0–0.7)
Eosinophils Relative: 2 % (ref 0–5)
Lymphs Abs: 0.4 10*3/uL — ABNORMAL LOW (ref 0.7–4.0)
Neutrophils Relative %: 82 % — ABNORMAL HIGH (ref 43–77)

## 2010-10-25 LAB — URINE MICROSCOPIC-ADD ON

## 2010-10-25 LAB — CULTURE, BLOOD (SINGLE): Culture: NO GROWTH

## 2010-10-25 LAB — APTT: aPTT: 51 seconds — ABNORMAL HIGH (ref 24–37)

## 2010-10-25 LAB — PREPARE RBC (CROSSMATCH)

## 2010-10-25 LAB — COMPREHENSIVE METABOLIC PANEL
ALT: 17 U/L (ref 0–53)
AST: 29 U/L (ref 0–37)
Albumin: 2.3 g/dL — ABNORMAL LOW (ref 3.5–5.2)
Alkaline Phosphatase: 40 U/L (ref 39–117)
BUN: 11 mg/dL (ref 6–23)
CO2: 30 mEq/L (ref 19–32)
Calcium: 7.7 mg/dL — ABNORMAL LOW (ref 8.4–10.5)
Chloride: 99 mEq/L (ref 96–112)
Creatinine, Ser: 0.68 mg/dL (ref 0.4–1.5)
GFR calc Af Amer: >60 mL/min (ref >60)
GFR calc non Af Amer: >60 mL/min (ref >60)
Glucose, Bld: 101 mg/dL — ABNORMAL HIGH (ref 70–99)
Potassium: 3.8 mEq/L (ref 3.5–5.1)
Sodium: 134 mEq/L — ABNORMAL LOW (ref 135–145)
Total Bilirubin: 3.7 mg/dL — ABNORMAL HIGH (ref 0.3–1.2)
Total Protein: 4.6 g/dL — ABNORMAL LOW (ref 6.0–8.3)

## 2010-10-25 LAB — TYPE AND SCREEN
ABO/RH(D): O POS
Antibody Screen: NEGATIVE

## 2010-10-25 LAB — POCT I-STAT 4, (NA,K, GLUC, HGB,HCT)
Glucose, Bld: 128 mg/dL — ABNORMAL HIGH (ref 70–99)
Glucose, Bld: 130 mg/dL — ABNORMAL HIGH (ref 70–99)
Glucose, Bld: 138 mg/dL — ABNORMAL HIGH (ref 70–99)
Glucose, Bld: 140 mg/dL — ABNORMAL HIGH (ref 70–99)
Glucose, Bld: 193 mg/dL — ABNORMAL HIGH (ref 70–99)
Glucose, Bld: 55 mg/dL — ABNORMAL LOW (ref 70–99)
HCT: 27 % — ABNORMAL LOW (ref 39.0–52.0)
HCT: 30 % — ABNORMAL LOW (ref 39.0–52.0)
HCT: 30 % — ABNORMAL LOW (ref 39.0–52.0)
HCT: 38 % — ABNORMAL LOW (ref 39.0–52.0)
HCT: 40 % (ref 39.0–52.0)
Hemoglobin: 10.2 g/dL — ABNORMAL LOW (ref 13.0–17.0)
Hemoglobin: 10.2 g/dL — ABNORMAL LOW (ref 13.0–17.0)
Hemoglobin: 12.9 g/dL — ABNORMAL LOW (ref 13.0–17.0)
Hemoglobin: 9.2 g/dL — ABNORMAL LOW (ref 13.0–17.0)
Potassium: 3.2 mEq/L — ABNORMAL LOW (ref 3.5–5.1)
Potassium: 3.5 mEq/L (ref 3.5–5.1)
Potassium: 3.7 mEq/L (ref 3.5–5.1)
Potassium: 4.2 mEq/L (ref 3.5–5.1)
Potassium: 4.4 mEq/L (ref 3.5–5.1)
Sodium: 132 mEq/L — ABNORMAL LOW (ref 135–145)
Sodium: 133 mEq/L — ABNORMAL LOW (ref 135–145)
Sodium: 134 mEq/L — ABNORMAL LOW (ref 135–145)
Sodium: 136 mEq/L (ref 135–145)
Sodium: 138 mEq/L (ref 135–145)

## 2010-10-25 LAB — MAGNESIUM
Magnesium: 2 mg/dL (ref 1.5–2.5)
Magnesium: 2.4 mg/dL (ref 1.5–2.5)

## 2010-10-25 LAB — PLATELET COUNT: Platelets: 106 10*3/uL — ABNORMAL LOW (ref 150–400)

## 2010-10-25 LAB — POCT I-STAT, CHEM 8
Calcium, Ion: 1.09 mmol/L — ABNORMAL LOW (ref 1.12–1.32)
Calcium, Ion: 1.15 mmol/L (ref 1.12–1.32)
Chloride: 100 mEq/L (ref 96–112)
Creatinine, Ser: 0.7 mg/dL (ref 0.4–1.5)
Glucose, Bld: 118 mg/dL — ABNORMAL HIGH (ref 70–99)
HCT: 29 % — ABNORMAL LOW (ref 39.0–52.0)
Hemoglobin: 8.5 g/dL — ABNORMAL LOW (ref 13.0–17.0)
Potassium: 4.5 mEq/L (ref 3.5–5.1)
TCO2: 28 mmol/L (ref 0–100)

## 2010-10-25 LAB — CREATININE, SERUM
Creatinine, Ser: 0.67 mg/dL (ref 0.4–1.5)
GFR calc Af Amer: >60 mL/min (ref >60)
GFR calc non Af Amer: >60 mL/min (ref >60)
GFR calc non Af Amer: >60 mL/min (ref >60)

## 2010-10-25 LAB — BRAIN NATRIURETIC PEPTIDE: Pro B Natriuretic peptide (BNP): 700 pg/mL — ABNORMAL HIGH (ref 0.0–100.0)

## 2010-10-25 LAB — POCT I-STAT GLUCOSE
Glucose, Bld: 138 mg/dL — ABNORMAL HIGH (ref 70–99)
Operator id: 3406
Operator id: 3406

## 2010-10-25 LAB — HEMOGLOBIN AND HEMATOCRIT, BLOOD: HCT: 26.9 % — ABNORMAL LOW (ref 39.0–52.0)

## 2010-10-25 LAB — TISSUE CULTURE: Culture: NO GROWTH

## 2010-11-04 ENCOUNTER — Encounter: Payer: Self-pay | Admitting: *Deleted

## 2010-11-07 ENCOUNTER — Encounter: Payer: Self-pay | Admitting: *Deleted

## 2010-11-23 NOTE — Discharge Summary (Signed)
NAME:  [name], [name] NO.:  [id number]   MEDICAL RECORD NO.:  [id number]          PATIENT TYPE:  INP   LOCATION:  4703                         FACILITY:  [facility]   PHYSICIAN:  Duke Salvia, MD, FACCDATE OF BIRTH:  [birth year]   DATE OF ADMISSION:  04/25/2008  DATE OF DISCHARGE:  05/08/2008                               DISCHARGE SUMMARY   ALLERGIES:  The patient has no known drug allergies, but he is under no  circumstances to have ASPIRIN, PLAVIX, or COUMADIN in the future.   FINAL DIAGNOSES:  1. Admitted with chest pain at rest radiating to the left arm.  2. Unstable angina.  3. Finding in emergency department, acute inferolateral myocardial      infarction (ST-segment elevation myocardial infarction).      a.     Allowed to complete.      b.     Zenith troponin I 31.30.      c.     Unable to intervene - would require IV heparin and       percutaneous intervention would require followup aspirin and       Plavix.      d.     History of left occipital parietal intracranial hemorrhage,       on anticoagulation therapy in the past.  4. Serial echocardiograms.  5. May 2009, ejection fraction 55%.  6. April 25, 2008, ejection fraction 30% with mid-to-apical inferior      wall akinesis.  7. May 01, 2008, ejection fraction 25-30%.  8. Atrial fibrillation/rapid ventricular rate, striving demand      ischemia this admission.  9. Implant of St. Jude PROMOTE biventricular implantable cardioverter      defibrillator, May 06, 2008.      a.     Atrioventricular node ablation, May 06, 2008 (atrial       fibrillation disconnect).  10.Relative hypotension, covered under this medical therapy at      discharge, unable yet to add ACE-I.  11.Urgent control of atrial fibrillation this admission with      amiodarone load until atrioventricular node ablation was      accomplished (amiodarone now off).  12.CT of the head, April 30, 2008.  Old infarcts, left   temporoparietal and right parietal infarcts with atrophy and      encephalomalacia.   SECONDARY DIAGNOSES:  1. Permanent atrial fibrillation.  2. Diabetes.  3. Hypertension.  4. History of syncope, unclear etiology, loop implant in 2003.  5. Ongoing tobacco habituation.  6. Chronic obstructive pulmonary disease.   PROCEDURES THIS ADMISSION:  1. May 06, 2008, implant of a St. Jude PROMOTE CRT-D system, Dr.      Sherryl Manges.  2. May 06, 2008, atrioventricular nodal ablation, Dr. Sherryl Manges.   BRIEF HISTORY:  [name] presents on April 25, 2008 with chest  pain radiating to the left arm.  This pain lasted about 2 hours in the  hospital before his medical therapy would allow cessation.  The patient  had STEMI consistent with inferolateral myocardial  infarction in the  setting of atrial fibrillation with rapid ventricular rate.  We were  able to treat medically only because he has a history of left  occipitoparietal intracranial hemorrhage in April 2009 on Coumadin.  We  are going to allow his myocardial infarction to self complete.  He will  be treated with digoxin, diltiazem beta-blockers for rate control of his  atrial fibrillation.  He may need amiodarone therapy urgently if these  measures do not help.   HOSPITAL COURSE:  The patient presents with chest pain on April 25, 2008.  He had an acute inferolateral myocardial infarction.  IV heparin  could not be started and percutaneous intervention could not be  considered secondary to history of occipitoparietal intracranial  hemorrhage on any anticoagulation.  He was started on rate control  medications and blood pressure control medication with an idea to  control his atrial fibrillation.  His chest pain did abate about 2 hours  after presentation to the emergency room with medical therapy.  His  troponin I cascaded to 31.3, that is the zenith.  The patient has had no  recurrent chest pain this  hospitalization.  His previous echocardiogram  in May 2009 was 55%.  On admission, his ejection fraction on April 25, 2008 was 30% with mid-to-apical inferior wall akinesis.  A subsequent  echocardiogram on May 01, 2008 showed an ejection fraction of 25-30%  with apical akinesis.  The lateral wall base and mid anterior wall are  the only walls moving well.  He had mild-to-moderate mitral  regurgitation and mild tricuspid regurgitation.  On hospital day #8, he  was started on IV amiodarone for better rate control, trying to get his  heart rate less than 100 beats per minute.  He was loaded with 5 mg  prior to his only procedure this admission which was implant of a [employer].  Jude PROMOTE CRT-D system, which is a biventricular ICD and then AV node  ablation to disconnect the AFib from causing increased ventricular rate.  This was as a result of an electrophysiology consult this admission.  The patient is maintaining medical therapy and is ready for discharge  postprocedure day #2 after the implant of the ICD and after the AV node  ablation.   Of note, the patient is pacing at 90 beats per minute.  This rate will  be adjusted at followup with the [facility] in 2 weeks.  The patient was  asked to stop aspirin and also he is to stop [place].  He is also to stop  his atenolol and to make sure that he never gets Plavix or Coumadin in  the future.  The patient is to weigh himself daily and I have stressed  this both on his discharge summary and to the patient himself.   DISCHARGE MEDICATIONS:  1. Lipitor 80 mg daily at bedtime.  This is a new medication.  2. Digoxin 0.125 mg daily.  3. Coreg 3.125 mg, a new medication.  4. Lasix 40 mg to take only if weight increases by 3-4 pounds over a      36-hour period.  5. Potassium chloride 20 mEq to take only if weight increases by 3-4      pounds over a 36-hour period.  6. Nitroglycerin 0.4 mg 1 tablet under the tongue every 5 minutes x3      doses  for chest pain and then he is to call 911.   DISCHARGE INSTRUCTIONS:  Once again,  he is to weigh himself every day.  He has a followup with [facility], [address] in  [place].  1. [facility], Thursday, May 29, 2008 at 9:40.  2. To see Dr. Myrtis Ser, Thursday, May 29, 2008 at 10:15.  3. To see Dr. Graciela Husbands, February 2010.  Dr. Odessa Fleming office will call for      that appointment.  4. At followup with Dr. Myrtis Ser, his relative hypotension will be      considered and maybe he will be able to tolerate an ACE-I.   LABORATORY STUDIES:  Troponin I studies were 0.02 in the emergency room,  then 11.25, then 18.08, and then 31.3.  Lipid panel this admission:  Cholesterol 74, triglycerides 71, HDL cholesterol 21, and LDL  cholesterol 39.  HGB A1c 6.5.  TSH is 0.669.  No cholesterol was taken  in setting of hypotension.  It was 15.5 during the morning hours which  was within normal limits.  Complete blood count this admission:  Hemoglobin 13.1, hematocrit 38.7,  white cells 11.3, and platelets of 196.  Serum electrolytes on the day  of discharge:  Sodium 134, potassium 4.2, chloride 96, carbonate 29, BUN  16, creatinine 0.81, and glucose 136.  Urinalysis was negative.  BNP on  May 03, 2008 was 269.      Maple Mirza, [place]      Duke Salvia, MD, [facility]  Electronically Signed    GM/MEDQ  D:  05/08/2008  T:  05/08/2008  Job:  [id number]

## 2010-11-23 NOTE — Op Note (Signed)
NAME:  [name], WIRZ NO.:  [id number]   MEDICAL RECORD NO.:  [id number]          PATIENT TYPE:  INP   LOCATION:  3020                         FACILITY:  [facility]   PHYSICIAN:  Duke Salvia, MD, FACCDATE OF BIRTH:  May 29, 1946   DATE OF PROCEDURE:  DATE OF DISCHARGE:  11/10/2007                               OPERATIVE REPORT   PREOPERATIVE DIAGNOSIS:  Syncope with the previously implanted loop  recorder.   POSTOPERATIVE DIAGNOSIS:  Syncope with the previously implanted loop  recorder.   PROCEDURE:  Explantation of an expired loop recorder.   Following obtaining informed consent, the patient was brought to the  electrophysiology laboratory and placed on the fluoroscopic table in  supine position.  After routine prep and drape, lidocaine was  infiltrated overlying the previous incision carried down to the layer of  the device pocket using sharp dissection electrocautery.  The pocket was  opened.  The device was explanted.  The retaining sutures were removed.  The anterior and posterior aspect of the pocket were opposed with a 3-0  Vicryl suture.  Hemostasis was obtained.  The pocket was copiously  irrigated with antibiotic containing saline solution and the wound was  then closed in three layers in normal fashion.  The wound was washed,  dried and Benzoin and Steri-Strip dressing was applied.  Needle counts,  sponge counts, and instrument counts were correct at the end of  procedure according to the staff.  The patient tolerated the procedure  without apparent complication.      Duke Salvia, MD, [facility]  Electronically Signed     SCK/MEDQ  D:  11/14/2007  T:  11/15/2007  Job:  [id number]   cc:   Electrophysiology Laboratory

## 2010-11-23 NOTE — Discharge Summary (Signed)
NAME:  ERDEM, NAAS NO.:  [id number]   MEDICAL RECORD NO.:  [id number]           PATIENT TYPE:   LOCATION:                                 FACILITY:   PHYSICIAN:  Maple Mirza, PA   DATE OF BIRTH:  04-Dec-1945   DATE OF ADMISSION:  DATE OF DISCHARGE:                               DISCHARGE SUMMARY   This concerns a loop recorder.  This was implanted for syncope, unclear  etiology 2003.  It is now expired.  He was in the hospital here for  about a week with embolic insults secondary to his atrial fibrillation  and a question of a cerebral bleed.  He is going home today on Nov 14, 2007, and we are taking the loop recorder out prior to his discharge.  He follows up at the [facility] on Thursday, Nov 29, 2007, at  9:40 for incision check and we would like to note that the patient has  had no recurrent syncope since loop recorder was implanted.   Laboratories have been reviewed prior to the explantation.  The patient  will received 1 dose of IV Ancef prior to on-call to the explantation,  probably discharging the same day on Nov 14, 2007.      Maple Mirza, PA     GM/MEDQ  D:  11/14/2007  T:  11/15/2007  Job:  [id number]   cc:   Duke Salvia, MD, [facility]

## 2010-11-23 NOTE — Op Note (Signed)
NAME:  [name], [name] NO.:  [id number]   MEDICAL RECORD NO.:  [id number]          PATIENT TYPE:  INP   LOCATION:  2302                         FACILITY:  [facility]   PHYSICIAN:  Salvatore Decent. Cornelius Moras, M.D. DATE OF BIRTH:  Jun 02, 1946   DATE OF PROCEDURE:  07/17/2008  DATE OF DISCHARGE:                               OPERATIVE REPORT   PREOPERATIVE DIAGNOSES:  1. Bacterial endocarditis.  2. Severe mitral regurgitation.  3. Patent foramen ovale.  4. Permanent atrial fibrillation.  5. Bilateral pleural effusions.  6. Class IV congestive heart failure.   POSTOPERATIVE DIAGNOSES:  1. Bacterial endocarditis.  2. Severe mitral regurgitation.  3. Secundum type atrial septal defect.  4. Permanent atrial fibrillation.  5. Bilateral pleural effusions.  6. Class IV congestive heart failure.   PROCEDURE:  Right thoracotomy for mitral valve replacement (33-mm  Medtronic Mosaic porcine bioprosthesis), autologous pericardial patch  closure of atrial septal defect and closure of left atrial appendage.   SURGEON:  Salvatore Decent. Cornelius Moras, MD   ASSISTANT:  Peterson Lombard   ANESTHESIA:  General.   BRIEF CLINICAL NOTE:  The patient is a 65 year old male with complicated  medical history.  The patient has a long-standing history of mitral  regurgitation and permanent atrial fibrillation.  The patient suffered a  stroke in May 2009 but recovered.  In October 2009, the patient  presented with congestive heart failure.  At that time, there was  concern regarding whether or not the patient could be anticoagulated,  and as a result his congestive heart failure and rapid atrial  fibrillation were treated with A-V node ablation followed by placement  of permanent pacemaker with defibrillator and biventricular pacing.  Class IV congestive heart failure symptoms improved somewhat, but the  patient then was noted to have blood cultures positive with  Streptococcus viridans species.  In early  December, a transesophageal  echocardiogram was performed demonstrating wide-open severe mitral  regurgitation with vegetations on the anterior leaflet of the mitral  valve.  There also was evidence for patent foramen ovale.  The patient  improved with medical therapy and was treated with a 4-week course of IV  antibiotics.  Blood cultures cleared.  The patient now presents for  elective surgical intervention.  In the meanwhile, the patient was re-  evaluated by the Neurology Team.  The patient is felt not to be a  candidate for long-term anticoagulation with Coumadin.  Given the life-  threatening nature of the patient's cardiac problems, short-term  anticoagulation with heparin for purposes of proceeding with heart  surgery is felt to be an acceptable risk under the circumstances.  A  full consultation note had been dictated previously.  The patient and  his wife have been counseled regarding the indications, risks, and  potential benefits of surgery.  The high-risk nature of surgical  intervention has been discussed with particular concern regarding the  potential for repeat intracranial bleeding or stroke as well as the  potential for prolonged respiratory failure postoperatively.  The  patient and his wife understand and accept all potential associated  risks  of surgery including but not limited to risk of death, stroke,  respiratory failure, congestive heart failure, bleeding requiring blood  transfusion, arrhythmia, recurrent infection or other late complications  related to valve repair or replacement.  The patient understands that  valve will be assessed as to whether or not it may be repairable, and if  it cannot be repaired the valve will be replaced using a bioprosthetic  tissue valve due to the inability for the patient to tolerate long-term  anticoagulation with Coumadin.  Prior to surgery, the patient was  admitted to the hospital and the indwelling permanent pacemaker and   defibrillator leads were all removed.  A temporary pacemaker was  replaced at the time due to the patient's pacer dependence following AV  node ablation.  The patient now desires to proceed with surgical  intervention as described.   OPERATIVE FINDINGS:  1. Severe (4+) mitral regurgitation.  2. Active vegetations on the anterior leaf of the mitral valve.  3. Flail anterior leaf of the mitral valve due to severe destruction      related to endocarditis, precluding any attempt at repair.  4. Secundum-type atrial septal defect measuring 8 mm in diameter.  5. Mild left ventricular dysfunction with ejection fraction estimated      40-45%.  6. Severe bi-atrial enlargement.  7. Moderate-to-severe left ventricular chamber enlargement.  8. Mild adhesions surrounding the right lung, presumably related to      previous spontaneous pneumothorax in the distant past.   OPERATIVE NOTE IN DETAIL:  The patient is brought to the operating room  on the above-mentioned date.  Of note, the patient is transported to the  preoperative holding area without oxygen, and on arrival the patient is  severely hypoxemic.  The patient rapidly responded to administration of  oxygen under the care and direction of the Anesthesia members present at  the time.  Central monitoring is established by the Anesthesia Team  under the care and direction of Dr. Kipp Brood.  An attempt at  placement of a Swan-Ganz catheter is performed via left internal jugular  approach.  The catheter could not be floated successfully through the  right ventricle into the pulmonary artery, and subsequently the catheter  is left in place functioning as a central venous pressure monitor.  A  radial arterial line is placed.  Intravenous antibiotics are  administered.  The patient is placed in the supine position on the  operating table.  General endotracheal anesthesia is induced  uneventfully under the care and direction of Dr. Noreene Larsson.  A  Foley  catheter is placed.  The patient placed in the supine position on the  operating table with a soft roll on the right scapula and the neck  gently extended and rotated towards the left side.   Baseline transesophageal echocardiogram is performed by Dr. Noreene Larsson.  This confirms the presence of severe (4+) mitral regurgitation with  nearly flail middle scallop of the anterior leaflet of the mitral valve  with obvious ruptured chordae tendineae.  There is moderate left  ventricular chamber dilatation with mild left ventricular dysfunction.  Ejection fraction is estimated at 40-45%.  There is biatrial  enlargement.  There is a fairly sizable shunt between the left and right  atrium consistent with patent foramen ovale or atrial septal defect.  There is  continuous left-to-right flow that fills the right atrium.   The patient's neck, chest, abdomen, both groins, and both lower  extremities were prepared and draped in  a sterile manner.  A small  incision is made over the anterior axillary line in the seventh  intercostal space.  The incision is completed through the subcutaneous  tissues and intercostal musculature with electrocautery.  The right  pleural space is entered bluntly.  A suction catheter is placed into the  right pleural space and a large right pleural effusion is drained.  A 10-  mm thoracoscopic port is passed through the incision and the right chest  is insufflated with carbon dioxide gas continuously throughout the  duration of the operation.  A 5-mm thoracoscope is passed through the  port and the right chest is explored visually.  There are some adhesions  posterolaterally around the right upper lobe.  Anteriorly, the right  lung is completely free with clear exposure of the medial chest and the  pericardium.  Subsequently, right anterolateral thoracotomy incision is  performed through the fourth intercostal space beginning at the level of  the right nipple.  Through  this incision, a few additional adhesions  surrounding the right upper lobe were divided with electrocautery.  The  incision is extended somewhat larger than typical due to the need to  divide these adhesions under direct vision.  A soft tissue retractor is  placed.   An oblique incision is made in the right groin.  The right common  femoral artery and right common femoral vein are dissected from  associated structures anteriorly.  The common femoral vein is isolated  at the level of the greater saphenous bulb.  The patient is heparinized  systemically.  Concentric pursestring sutures are placed around the  greater saphenous bulb.  Two concentric pursestring sutures are placed  in the anterior surface of the common femoral artery.  The common  femoral vein is then cannulated using a Seldinger technique with a  guidewire advanced through the saphenous bulb into the common femoral  vein up through the inferior vena cava through the right atrium into the  superior vena cava using transesophageal echocardiogram for guidance.  A  small stab incision is made in the right neck.  The right internal  jugular vein is cannulated using a Seldinger technique.  However,  despite multiple attempts with easy cannulation of the vessel, a  guidewire does not easily pass into the right atrium.  Therefore,  superior vena cava cannulation through the right neck is aborted and a  pursestring suture is placed across the small stab incision.  The right  common femoral artery is now cannulated using a Seldinger technique and  an 18-French femoral artery cannula is easily advanced into the femoral  artery.  Adequate heparinization is verified.   Cardiopulmonary bypass is begun and vacuum-assist venous drainage is  utilized.  A longitudinal incision is made in the pericardium 2 cm  anterior to the phrenic nerve.  Traction sutures are placed in either  direction to retract the pericardium.  There is excellent  exposure of  the interatrial groove and the right atrium.  There is an excellent  exposure of the proximal ascending thoracic aorta.  No other  abnormalities are noted.  There is severe biatrial enlargement.  There  is excellent venous drainage.  A cardioplegic cannula is placed directly  in the proximal ascending thoracic aorta through two concentric  pursestring pledgeted sutures.   The patient is cooled to 28 degrees systemic temperature.  The aortic  cross-clamp is applied and cold blood cardioplegia is delivered in  antegrade fashion through the aortic root.  The initial cardioplegic  arrest is rapid.  Repeat doses of cardioplegia are administered  intermittently throughout the entire cross-clamp portion of the  operation every 20 minutes or whenever return of any myocardial activity  is noted on the electrocardiogram.   A left atriotomy incision is performed posteriorly through the  interatrial groove.  The left atrium is examined from inside.  There is  an obvious secundum-type atrial septal defect with blood flow continuing  from the right atrium into the left atrium through this hole.  This hole  is temporarily closed with an oversewing 4-0 Prolene suture.   The mitral valve is exposed using a self-retaining retractor with the  retractor exited through the fourth intercostal space just lateral to  the right border of the sternum.  Exposure is felt to be excellent.  The  mitral valve is carefully examined.  There is active vegetations on the  free margin of the anterior leaflet of the mitral valve along the atrial  surface.  The majority of the primary chords to the anterior papillary  muscle and several chords to the posterior papillary muscle have been  destroyed and are flail.  Approximately, 50% of the free margin of the  anterior leaflet is involved with active vegetation with need to be  excised.  An attempt at repairing valve is felt to be unlikely to  succeed and  extremely unwise under the circumstances.  The anterior  leaflet is excised sharply including all of the vegetations and infected  tissue.  A portion of the subvalvular apparatus to the anterior leaflet  from the posterior papillary muscle is preserved.  All of the  subvalvular apparatus from the anterior leaflet to the anterior  papillary muscle has been involved and destroyed with endocarditis and  must be discarded.  The posterior leaflet is remarkably normal with the  exception of one small area and an area of vegetation immediately across  from the large vegetation from the anterior leaflet.  This is excised  sharply splitting the posterior leaflet in the midline between P1 and  P2.  The remainder of the posterior leaflet and subvalvular apparatus is  preserved.  Instruments are changed.  Cultures from the resected valve  specimens are sent for routine culture and sensitivity.  The remaining  mitral valve tissue, the left atrium and the left ventricle are now  irrigated with copious saline solution.   The valve is sized to accept a 33-mm stented bioprosthetic tissue valve.  Mitral valve replacement is performed using interrupted 2-0 Ethibond  horizontal mattress pledgeted sutures with pledgets in the supra-annular  position.  A Medtronic Mosaic porcine bioprosthetic tissue valve (serial  number [id number]) is secured in place uneventfully.  The valve appears  to seat normally.  The valve is carefully checked by pressurizing the  left ventricle with saline to make sure it is secured circumferentially  with no sign of any gaps for possible perivalvular leak.  Rewarming is  begun.   The left atrial appendage is oversewn from within the left atrium using  a two-layer closure of running 3-0 Prolene suture.   The atrial septal defect is now closed using a piece of autologous  pericardium placed to cover the entire atrial defect with a running 5-0  Prolene suture from within the left  atrium.   Left ventricular vent is placed across the mitral valve.  The left  atriotomy incision is now closed using a two-layer closure of running 3-  0 Prolene suture.  Epicardial pacing wires are fixed to the right  ventricular free wall.  A 28-French Bard drain is placed to drain the  pericardial sac.  One final dose of warm hot shot cardioplegia is administered.  The patient is placed in a Trendelenburg position.  The  aortic cross-clamp is removed after a total cross-clamp time of 140  minutes.   The heart begins to beat spontaneously without need for cardioversion.  Epicardial pacing wires are fixed to the right atrial appendage.  The  patient is rewarmed to 37 degrees centigrade temperature.  The lungs are  ventilated and heart allowed to fill briefly and then the left  ventricular vent is removed.  The patient is examined with  transesophageal echocardiogram and the mitral valve prosthesis appears  to be functioning normally.  Full cardiopulmonary bypass is reinstituted  and the antegrade cardioplegic cannula is removed.  The antegrade  cardioplegia site is carefully inspected for hemostasis.   The patient is weaned from cardiopulmonary bypass without difficulty.  The patient's rhythm at separation from bypass is ventricular paced.  Total cardiopulmonary bypass time of the operation is 193 minutes.  The  patient is weaned from bypass on dopamine at 3 mcg/kg/minute.  Followup  transesophageal echocardiogram performed by Dr. Noreene Larsson after separation  from bypass demonstrates a well-seated bioprosthetic tissue valve in the  mitral position.  The valve is functioning normally with no leak.  There  is no sign of perivalvular leak.  Left ventricular function is preserved  and if anything may appear improved in comparison with preoperatively.  There are no segmental wall motion abnormalities.  There is no air.  There is no residual defect across the interatrial septum with no sign   of any residual patent foramen ovale or septal defect.   Protamine is administered to reverse the anticoagulation.  The femoral  venous cannula is removed and the saphenous bulb ligated.  The femoral  arterial cannula is then removed and the pursestring sutures secured on  the anterior surface of the femoral artery.  There is an easily palpable  pulse in the distal femoral artery.   The right chest is irrigated with saline solution and inspected for  hemostasis.  There is some bleeding along the visceral surface of the  pleura along the right upper lobe along some adhesions which have torn  posteriorly.  This area is stapled off with two fires of  Echelon  endoscopic stapler.  The lung is now inspected and there is no sign of  any air leak or residual bleeding.  The right pleural space is drained  using one 28-French Bard drain and a 36-French chest tube placed  posteriorly through the two previous port incisions inferiorly.  The  thoracotomy incision is closed in multiple layers.  The skin incision is  closed with subcuticular skin closure.  The groin incision is inspected  for hemostasis, closed in multiple layers with skin incision closed with  subcuticular skin closure.  The chest tubes are fixed to a closed  suction drainage device.   The patient is now turned back to the flat supine position and the left  pleural chest tube is placed through the anterior axillary line and the  seventh intercostal space.  A large amount of thin, bloody fluid  emanates from the left pleural space, consistent with the patient's  known pleural effusion preoperatively as well as possible crossing into  the left pleural space intraoperatively.   Another attempt at passing a Swan-Ganz catheter is  performed by Dr.  Noreene Larsson.  This is unsuccessful and the Swan-Ganz catheter is removed.  The patient is reintubated using a single-lumen endotracheal tube.  The  patient is transported to the surgical intensive  care unit in stable  condition.  There are no intraoperative complications.  All sponge,  instrument and needle counts are verified and correct at completion of  the operation.  The patient was transfused 2 units of fresh frozen  plasma and two packs of platelets for coagulopathy after separation from  bypass.      Salvatore Decent. Cornelius Moras, M.D.  Electronically Signed     CHO/MEDQ  D:  07/17/2008  T:  07/18/2008  Job:  [id number]   cc:   Luis Abed, MD, [facility]  Duke Salvia, MD, [facility]  Tinnie Gens A. Tawanna Cooler, MD

## 2010-11-23 NOTE — H&P (Signed)
NAME:  [name], [name] NO.:  [id number]   MEDICAL RECORD NO.:  [id number]          PATIENT TYPE:  INP   LOCATION:  3702                         FACILITY:  [facility]   PHYSICIAN:  Unice Cobble, MD     DATE OF BIRTH:  1946/04/06   DATE OF ADMISSION:  06/07/2008  DATE OF DISCHARGE:                              HISTORY & PHYSICAL   CARDIOLOGIST:  Luis Abed, MD, [facility]   CHIEF COMPLAINT:  Syncope.   HISTORY OF PRESENT ILLNESS:  This is a 65 year old white male with a  recent history of MI treated medically status post AV nodal ablation for  chronic atrial fibrillation with placement of BiV ICD, CHF, COPD and  diabetes mellitus who presents with syncope.  The patient had stood up  and became lightheaded with resultant syncope.  He tells me he hit his  forehead on the floor with a resultant headache.  There has been no  bleed seen on CT.  No chest pain or shortness of breath or palpitations  preceded his syncope.  He did not have a headache prior to his syncope.  He denies edema, PND or orthopnea.  He is taking medications as  prescribed.   PAST MEDICAL HISTORY:  1. Status post inferolateral myocardial infarction on April 25, 2008, treated medically without intervention secondary to problems      with anticoagulants.  2. History of intercerebral hemorrhage with anticoagulation.  3. Status post AV nodal ablation with [employer] BiV ICD placed for      chronic atrial fibrillation.  4. Congestive heart failure with an ejection fraction of 25-30%      evaluated on May 01, 2008, with an echocardiogram.  5. History of relative hypotension.  6. COPD.  7. Diabetes mellitus.  8. History of syncope.  9. History of PFO.  10.Remote spontaneous pneumothorax.   ALLERGIES:  No known drug allergies.   MEDICATIONS:  These medications have been verified with the wife by  phone.  1. Nizatidine 150 mg b.i.d.  2. Potassium chloride 10 mg daily and 20 mg p.r.n. with  weight gain.  3. Lasix 20 mg daily and 40 mg p.r.n. with weight gain.  4. Ramipril 2.5 mg daily (started 1 week ago).  5. Nitroglycerin as needed.  6. Coreg 3.125 mg b.i.d.  7. Lipitor 80 mg q.h.s.   SOCIAL HISTORY:  He lives in [place] with his wife.  He quit smoking  in October with his myocardial infarction.  No alcohol or drugs.   FAMILY HISTORY:  Noncontributory.   REVIEW OF SYSTEMS:  Complete review of systems found to be otherwise  negative except as stated in the HPI.   PHYSICAL EXAM:  VITAL SIGNS:  His temperature is 97.3 with a pulse of  80, respiratory rate of 18, blood pressure is 96/59 lying down, 95/51  standing and 100/54 sitting.  His pulse with these same maneuvers is 80,  88 and 87.  His O2 sats are 92% on 2 liters.  GENERAL:  In general he is in no acute distress.  HEENT:  Shows NCAT, PERRLA, EOMI, MMM, oropharynx without erythema or  exudates.  NECK:  Supple without lymphadenopathy, thyromegaly, bruits or jugular  venous distention.  HEART:  Has a regular rate and rhythm with a normal S1-S2.  He is a 2/6  sharp early systolic murmur which is likely an ejection murmur.  LUNGS:  Lungs have bilateral rales with decreased sounds in the bases.  ABDOMEN:  Abdomen is soft and nontender with normal bowel sounds.  No  rebound or guarding.  EXTREMITIES:  Show no cyanosis, clubbing or edema.  His pacer is normal  without erythema or warmth.  MUSCULOSKELETAL:  Shows no joint deformities or effusions.  No spine or  CVA tenderness.  NEUROLOGICAL:  He is alert and oriented x3 with cranial nerves II-XII  grossly intact.  Strength is 5/5 all extremities and axial groups with  normal sensation throughout.   LABORATORY AND RADIOLOGY REVIEW:  A head CT showed small sequelae of  left posterotemporal hemorrhage without acute changes.  Abdominal CT was  done which showed pulmonary edema with layering effusions.  He has an  upper pole splenic infarct of unknown age.  He has  bilateral simple  renal cysts.  Chest x-ray showed cardiomegaly with pulmonary edema.  EKG  showed a rate of 84 in atrial flutter with V pacing.  Labs show a white  count of 12 with a neutrophil count of 10.6.  His hemoglobin is 11.7.  His basic metabolic panel is unremarkable.  Total bilirubin is 2.3 with  normal AST and ALT.  Total protein is 6.8 with an albumin of 2.8.  CK-MB  and troponins are both negative.  His digoxin is nondetectable.   ASSESSMENT AND PLAN:  This is a 65 year old white male with a history of  ischemic cardiomyopathy with a recent myocardial infarction and a BiV  ICD (implantable cardioverter defibrillator) placed secondary to atrial  fibrillation (who cannot take anticoagulation) who presented with  syncope.  1. Syncope:  It appears that his syncope is likely related to      hypotension, possibly related to starting his ACE inhibitor earlier      this week.  I will stop his ramipril and continue his other      medications as prescribed and evaluate overnight for blood pressure      changes.  He will need rule out of myocardial infarction as well.  2. Congestive heart failure:  Symptomatically he is stable but has      lung crackles on exam and effusions on x-ray.  Will attempt mild      diuresis with IV Lasix and reevaluate in the morning.  3. Increased white blood cell count:  Urinalysis and blood cultures      will be checked.  His pacer site is not warm which is comforting.      No evidence for pneumonia present.  4. Increased bilirubin:  Likely from hematoma pocket resorption.  As      he recieved a negative abdominal CT in the ED, no further workup      needed unless it continues a month down the line.  5. Low oxygen:  The patient has chronic obstructive pulmonary disease      history and has extra fluid in his lungs.  I suspect his baseline      O2 sats are around 90%.  I will attempt diuresis and try not to      give him too much oxygen  as I suspect  he is a CO2 retainer.  6. Gastrointestinal prophylaxis with his home medications.  Sequential      compressive devices.  7. No anticoagulants.      Unice Cobble, MD  Electronically Signed     ACJ/MEDQ  D:  06/07/2008  T:  06/08/2008  Job:  [id number]

## 2010-11-23 NOTE — Discharge Summary (Signed)
NAME:  [name], [name] NO.:  [id number]   MEDICAL RECORD NO.:  [id number]          PATIENT TYPE:  INP   LOCATION:  2003                         FACILITY:  [facility]   PHYSICIAN:  Luis Abed, MD, FACCDATE OF BIRTH:  May 31, 1946   DATE OF ADMISSION:  06/10/2008  DATE OF DISCHARGE:  06/19/2008                               DISCHARGE SUMMARY   ADDENDUM:   PRIMARY CARDIOLOGIST:  Dr. Willa Rough.   ORAL SURGEON:  Dr. Cindra Eves.   INFECTIOUS DISEASE:  Dr. Johny Sax.   NEURO:  Dr. Delia Heady.   THORACIC SURGERY:  Dr. Tressie Stalker.   This is an addendum to a previously dictated discharge summary, job  number is [id number].  Please refer to that dictation for complete details  of this admission.   Prior to discharge, patient will have a PICC line placed so that he may  receive his intravenous ceftriaxone 2 g IV daily for the next 31 days.   Complete list of discharge medications include:  1. Ceftriaxone 2 g IV daily x31 days.  2. Rifampin 30 mg p.o. b.i.d. x31 days.  3. Potassium chloride 10 mEq daily.  4. Lasix 20 mg daily.  5. Coreg 3.125 mg b.i.d.  6. Aspirin 81 mg daily.  7. Nizatidine 150 mg b.i.d.   FOLLOWUP PLANS AND APPOINTMENTS:  Patient will follow up with D. Willa Rough in our [facility] office on June 25, 2008, at 12:15 p.m.  On  the same day, June 25, 2008, he will follow up with Dr.  Cindra Eves in his office at 3 p.m. for teeth cleaning.  Dr.  Kristin Bruins is also in the process of setting the patient up for followup  with regard to root canal in tooth #30 with a Dr. Retta Mac who is an  endodontist.  Electrophysiology and thoracic surgery followup will be  arranged through Dr. Willa Rough as the patient's course progresses.      Nicolasa Ducking, ANP      Luis Abed, MD, [facility]  Electronically Signed    CB/MEDQ  D:  06/19/2008  T:  06/19/2008  Job:  [id number]   cc:   Salvatore Decent. Cornelius Moras, M.D.  Charlynne Pander, D.D.S.  Lacretia Leigh. Ninetta Lights, M.D.  Pramod P. Pearlean Brownie, MD

## 2010-11-23 NOTE — Discharge Summary (Signed)
NAME:  [name], [name] NO.:  [id number]   MEDICAL RECORD NO.:  [id number]          PATIENT TYPE:  INP   LOCATION:  4703                         FACILITY:  [facility]   PHYSICIAN:  Duke Salvia, MD, FACCDATE OF BIRTH:  1945-11-06   DATE OF ADMISSION:  04/25/2008  DATE OF DISCHARGE:  05/08/2008                               DISCHARGE SUMMARY   ADDENDUM:  The patient had been given at discharge a prescription for  Lasix 40 mg and potassium 20 mEq, to be taken as a rescue basis if his  weight increased by 3-4 pounds over a 36-hour period.  He also takes at  home Lasix 20 mg daily and potassium 10 mEq daily.  I thought it would  be best to just restart him on those, although with his relative  hypotension that might not be the best thing, but I thought it would be  best to restart him on those.      Maple Mirza, [place]      Duke Salvia, MD, [facility]  Electronically Signed    GM/MEDQ  D:  05/08/2008  T:  05/09/2008  Job:  [id number]   cc:   Tinnie Gens A. Tawanna Cooler, MD

## 2010-11-23 NOTE — H&P (Signed)
NAME:  DICK, HARK NO.:  [id number]   MEDICAL RECORD NO.:  [id number]          PATIENT TYPE:  INP   LOCATION:  1828                         FACILITY:  [facility]   PHYSICIAN:  Unice Cobble, MD     DATE OF BIRTH:  07-Nov-1945   DATE OF ADMISSION:  04/25/2008  DATE OF DISCHARGE:                              HISTORY & PHYSICAL   CARDIOLOGIST:  Dr. Myrtis Ser with [facility].   CHIEF COMPLAINT:  Chest pain.   HISTORY OF PRESENT ILLNESS:  This is a 65 year old white male with a  history of hemorrhagic CVA, atrial fibrillation, diabetes mellitus who  presents with STEMI.  The patient had onset of chest pain at rest  tonight, approximately one hour prior to admission.  The chest pain was  substernal and radiated to his left arm.  He had no shortness of breath,  nausea or diaphoresis.  He does not complain of orthopnea, PND or edema.  He has no residual deficits from his hemorrhagic stroke.  His original  stroke occurred in January in the setting of Coumadin use which was  subsequently stopped.  He thereafter was placed on aspirin only.  He  rebled on aspirin alone in May and has not been on any anticoagulation  since.   PAST MEDICAL HISTORY:  1. Atrial fibrillation.  2. Hemorrhagic stroke in February in the left temporal parietal region      in the setting of atrial fibrillation with an INR of 2.3.  He has      had no aspirin since July.  3. Diabetes mellitus.  4. CHF.  5. Syncope.  6. PFO.  7. Remote spontaneous pneumothorax.  8. Echo done in Nov 16, 2007 showed an ejection fraction of 55% with      mitral valve regurgitation and aortic sclerosis and left atrial      enlargement.   ALLERGIES:  NO KNOWN DRUG ALLERGIES.   MEDICATIONS:  1. Atenolol 100 mg b.i.d.  2. Lanoxin 0.25 mg.  3. Cardia XT 240.  4. Nizatidine.  5. Lasix 20.  6. Potassium   SOCIAL HISTORY:  Lives in [place] with his wife.  He smokes one pack  per day, started at the age of 75.   No alcohol or drugs.   FAMILY HISTORY:  Noncontributory.   REVIEW OF SYSTEMS:  Left groin pain last week.  All other systems  reviewed and found to be negative.   PHYSICAL EXAMINATION:  VITAL SIGNS:  Temperature is afebrile with pulse  of 124, respiratory rate 21, blood pressure is 107/71 with an O2 sat 91%  on room air.  GENERAL:  He is thin and in pain.  HEENT:  Shows PERRLA, EOMI, MMM, oropharynx without erythema or  exudates.  Supple without lymphadenopathy, thyromegaly, bruits, jugular  venous distention.  HEART:  Has regular rate and rhythm.  Normal S1-S2 with 2/6 systolic  murmur radiating to his axilla.  LUNGS:  Clear to auscultation bilaterally.  ABDOMEN:  Soft and nontender with normal bowel sounds.  No rebound or  guarding.  No hepatosplenomegaly.  EXTREMITIES:  Show no cyanosis, clubbing or edema.  NEUROLOGICAL:  He is alert and oriented x3 with cranial nerves II-XII  grossly intact.  Strength is 5/5 all extremities and axial groups.   LABORATORY DATA:  EKG shows a rate of 106 when sinus tachycardia.  His  axis is normal.  He has acute ST elevations indicating injury in the  inferior and lateral leads.  His labs show a hemoglobin of 15.3 with a  potassium of 3.5, creatinine of 1.0, and a glucose of 142.  CK-MB and  troponin are not yet back.   ASSESSMENT/PLAN:  This is a 64 year old white male with a history of  diabetes mellitus and hemorrhagic stroke who presents with STEMI in the  inferio-lateral regions.  He has a high intracerebral bleeding risk with  anticoagulation for PCI.  The alternative to PCI is to complete his  infarct medically with the likely results of CHF and possibly death.  Both options are potentially fatal.  I have discussed the risks of  catheterization with the patient and his wife and Dr. Amil Amen has  reiterated these in my attendance.  A conservative route has been chosen  as he is such high risk from anticoagulation for neurological bleed.   He  will be started on an aspirin, beta blocker, and statin without extra  heparin or Plavix.  He will receive an echocardiogram in the a.m.      Unice Cobble, MD  Electronically Signed     ACJ/MEDQ  D:  04/25/2008  T:  04/25/2008  Job:  [id number]

## 2010-11-23 NOTE — Discharge Summary (Signed)
NAME:  [name], [name] NO.:  [id number]   MEDICAL RECORD NO.:  [id number]          PATIENT TYPE:  INP   LOCATION:  4703                         FACILITY:  [facility]   PHYSICIAN:  Duke Salvia, MD, FACCDATE OF BIRTH:  August 13, 1945   DATE OF ADMISSION:  04/25/2008  DATE OF DISCHARGE:  05/08/2008                               DISCHARGE SUMMARY   ADDENDUM  Addendum covers medication change.   He is going home also on Lasix 20 mg daily and potassium chloride 10 mEq  daily.  He has been given a prescription as well for Lasix 40 to be  taken if his weight increases about 3-4 pounds over 36-hour period, but  I thought, he could go back on his regular Lasix and potassium that he  has been checking at home.  In addition, I wanted to mention that at his  ICD appointment in 2 weeks.  His device will probably be turned down  from 90 beats per minute with bi-V pacing to 80 beats per minute and  then subsequently to 70 beats per minute following up in 2 more weeks.      Maple Mirza, [place]      Duke Salvia, MD, [facility]  Electronically Signed    GM/MEDQ  D:  05/08/2008  T:  05/09/2008  Job:  [id number]

## 2010-11-23 NOTE — Assessment & Plan Note (Signed)
[facility]                            CARDIOLOGY OFFICE NOTE   Charles, [name]                        MRN:          [id number]  DATE:05/29/2008                            DOB:          1946/03/20    [name] is seen post hospitalization.  He had a complicated course, but  stabilized.  He presented with an acute inferolateral MI.  His peak  troponin was 31.  Because of his history of bleeding into his head, very  careful decision was made and an acute intervention could not be done.  He has a history of left occipital, parietal, intracranial bleed.  He  did have a followup CT scan that showed that this area was stable.  The  patient has atrial fibrillation.  Ultimately, we were not able to  control his rate.  Because of this, on May 06, 2008, he underwent AV  nodal ablation with the placement of a St. Jude Promote biventricular  implantable cardiac defibrillator because he has LV dysfunction.  He  recovered nicely from this and he has done well.  He is not having any  chest pain or shortness of breath.  He had a sweat one evening.  The  etiology is not clear.  His ICD has been interrogated and he did not  have a marked problem at that time.  He has relative hypotension and up  into this point we have not been able to use an ACE inhibitor.  Today he  returns.  He is not having any chest pain or shortness of breath.  As  mentioned, he did have one sweat.  He talks about a sensation of food  sticking in his throat intermittently when eating.  He has not had any  vomiting.  This is new since the hospital.   PAST MEDICAL HISTORY:   ALLERGIES:  No known drug allergies.   MEDICATIONS:  1. Nizatidine 150 b.i.d.  2. Furosemide 20 daily.  3. Potassium 10 daily.  4. Carvedilol 3.125 b.i.d.  5. Lipitor 80.   OTHER MEDICAL PROBLEMS:  See the complete list below.   REVIEW OF SYSTEMS:  He is not having any GU symptoms now.  He is having  some  swallowing difficulties.  He was not intubated while at the  hospital.  He is not having any rashes or fevers or chills.  Otherwise,  his review of systems is negative.   PHYSICAL EXAMINATION:  VITAL SIGNS:  Blood pressure is 112/56 with a  pulse of 65.  Weight is 165 pounds.  GENERAL:  The patient is oriented to person, time, and place.  Affect is  normal.  He is here with his wife today.  HEENT:  No xanthelasma.  He has normal extraocular motion.  There are no  carotid bruits.  There is no jugular venous distention.  LUNGS:  Clear.  Respiratory effort is not labored.  CARDIAC:  S1 with an S2.  There are no clicks or significant murmurs.  ABDOMEN:  Soft.  EXTREMITIES:  He has no significant peripheral edema.   His ICD site  is nicely healed.  His ICD has been interrogated.  [name]  will be checking further evaluating his lead configuration.  There was  no marked abnormality noted.   PROBLEMS:  1. Chronic atrial fibrillation.  2. Hypertension controlled.  3. Status post inferolateral myocardial infarction on April 25, 2008, treated medically because he cannot have anticoagulants of      any type used because of his head.  4. Left ventricular dysfunction.  His ejection fraction is reduced.      We will need to try to increase his medications now including the      addition of an ACE inhibitor and the titration of his carvedilol as      the best he tolerates.  5. History of an left occipital parietal intracranial hemorrhage.  He      had a followup CT scan that showed that this area was stable.  He      follows with Dr. Pearlean Brownie.  6. Rapid atrial fibrillation rate status post AV nodal ablation,      stable.  7. AV nodal ablation and left ventricular dysfunction leading to the      placement of a St. Jude biventricular implantable cardiac      defibrillator on May 06, 2008.  8. Relative hypotension.  This is somewhat better and we will      carefully add a low-dose ACE  inhibitor.  9. Chronic obstructive pulmonary disease.  We will add low-dose ACE      inhibitor and I will see him back in followup.     Charles Abed, MD, [facility]  Electronically Signed    JDK/MedQ  DD: 05/29/2008  DT: 05/29/2008  Job #: [id number]   cc:   Pramod P. Pearlean Brownie, MD  Eugenio Hoes Tawanna Cooler, MD

## 2010-11-23 NOTE — Assessment & Plan Note (Signed)
[facility]                            CARDIOLOGY OFFICE NOTE   [name], [name]                        MRN:          [id number]  DATE:01/07/2008                            DOB:          1946/02/13    [name] has chronic atrial fibrillation.  Recently, he had been  hospitalized.  He had a left temporal parieto-occipital hemorrhage in  the setting of atrial fibrillation on chronic Coumadin, with an INR of  2.3.  While in the hospital, he stabilized.  His anticoagulation was  reversed.  He improved over time.  It was felt that the hemorrhage was  likely due to Coumadin, but there was no way to identify if it was  possibly a hemorrhagic transfusion.  The patient had had a long-term  loop recorder in place, and it was removed.  There was a plan to  consider starting aspirin at 1 month.  The patient should not receive  Coumadin in the future.  The issue as to whether or not the patient  should have a left atrial occlusive device over time could be  considered.  He was discharged home.  Aspirin has not yet been  restarted.   The patient is feeling well.  He is not having any significant problems.   PAST MEDICAL HISTORY:  ALLERGIES:  No known drug allergies.   MEDICATIONS:  1. Atenolol 100 b.i.d.  2. Lanoxin 0.25.  3. Cartia XT 240.  4. Nizatidine.  5. Furosemide 20.  6. Potassium.   OTHER MEDICAL PROBLEMS:  See the list below.   REVIEW OF SYSTEMS:  He is feeling well and has recovered nicely from his  neurologic event.   PHYSICAL EXAMINATION:  VITAL SIGNS:  Blood pressure is 96/52 with a rate  of 86.  GENERAL:  The patient is oriented to person, time, and place.  Affect is  normal.  His skin is tan.  HEENT:  No xanthelasma.  He has normal extraocular motion.  NECK:  There are no carotid bruits.  There is no jugular venous  distention.  LUNGS:  Clear.  Respiratory effort is not labored.  CARDIAC:  S1 with an S2.  There are no clicks or  significant murmurs.  The rhythm is irregularly irregular.  ABDOMEN:  Soft.  EXTREMITIES:  He has no peripheral edema.   EKG reveals nonspecific ST-T wave changes and atrial fibrillation.   PROBLEMS:  1. History of a temporal parietooccipital hemorrhage in the setting of      good INR on Coumadin and therefore Coumadin was stopped.  2. Atrial fibrillation that will have to be followed over time.  3. Diabetes.  4. Question of history of congestive heart failure in the past.  5. History of syncope.  6. Incidental patent foramen ovale found during the workup.  7. Chronic obstructive pulmonary disease.  8. Continued smoking despite all recommendations.  9. History of remote spontaneous pneumothorax.  10.Status post loop recorder, that was finally removed recently.  11.Ejection fraction of 55% by recent echocardiogram.   Considering all issues, he is clinically stable and I will  not recommend  any other changes.  He will see Dr. Pearlean Brownie soon, and Dr. Pearlean Brownie can decide  if his aspirin can be restarted.     Luis Abed, MD, [facility]  Electronically Signed    JDK/MedQ  DD: 01/07/2008  DT: 01/08/2008  Job #: [id number]   cc:   Pramod P. Pearlean Brownie, MD  Eugenio Hoes Tawanna Cooler, MD

## 2010-11-23 NOTE — Consult Note (Signed)
NAME:  [name] NO.:  [id number]   MEDICAL RECORD NO.:  [id number]          PATIENT TYPE:  INP   LOCATION:  2003                         FACILITY:  [facility]   PHYSICIAN:  Salvatore Decent. Cornelius Moras, M.D. DATE OF BIRTH:  29-Sep-1945   DATE OF CONSULTATION:  06/17/2008  DATE OF DISCHARGE:                                 CONSULTATION   REQUESTING PHYSICIAN:  Luis Abed, MD, [facility]   REASON FOR CONSULTATION:  Severe mitral regurgitation with possible  bacterial endocarditis.   HISTORY OF PRESENT ILLNESS:  [name] is a 65 year old gentleman from  [place] with complicated past medical history.  The patient has  longstanding history of atrial fibrillation with recurrent syncopal  events.  The patient's atrial fibrillation apparently goes back more  than 25 years ago, and he has been followed recently through Dr. Myrtis Ser  and Dr. Graciela Husbands.  He has had several syncopal episodes in the past, with  subsequent negative cardiac loop monitors and EP testing for cardiac  sources of syncope.  Along with this, the patient has had intermittent  episodes of congestive heart failure and serial echocardiograms have  demonstrated at least mild mitral regurgitation for years.  In May 2009,  the patient presented acutely with an acute intracranial bleed while he  was therapeutic on Coumadin.  The patient recovered remarkably well  following this event with virtually no residual neurologic deficit.  All  anticoagulation was discontinued at the time of this event.  In October  2009, the patient presented with an acute ST-segment elevation  myocardial infarction reportedly with electrocardiograms consistent with  inferior lateral wall ischemia.  Because of the history of previous  intracranial bleed, the patient was treated without intervention and  never underwent cardiac catheterization.  An echocardiogram at that time  demonstrated moderate-to-severe mitral regurgitation and severe left  ventricular dysfunction.  Congestive heart failure was treated medically  initially.  Later that hospitalization the patient had implantation of a  biventricular pacemaker/ICD with AV ablation and ultimately discharged  without any type of anticoagulation.  The patient was readmitted to the  hospital in November with another syncopal episode and class IV  congestive heart failure.  Reportedly no definite sources for syncope  were identified and his defibrillator has never discharged.  His  symptoms were improved with diuretic therapy, and he was discharged  home.  However, blood cultures obtained during that hospital admission  were notably positive for Streptococcus viridans, and the patient was  then readmitted to the hospital on June 10, 2008 for further  management therapy.  Multiple sets of blood cultures have grown  Streptococcus viridans.  Repeat echocardiogram was performed confirming  the presence of persistent severe mitral regurgitation with flail  anterior leaflet of the mitral valve and possible vegetations.  This was  confirmed on transesophageal echocardiogram performed on June 12, 2008.  Transesophageal echocardiogram also confirms the presence of a  small patent foramen ovale with continuous left-to-right flow.  Cardiothoracic surgical consultation was requested.   REVIEW OF SYSTEMS:  GENERAL:  The patient reports that overall he has  been doing reasonably well at home despite the stroke he suffered last  May and the subsequent of heart attack in October.  He reports to be  ambulatory and overall, he denies other constitutional complaints.  He  specifically denies any history of fevers, chills, or night sweats.  He  is currently alone and no other family members including his wife are  currently available to assist with review of systems.  CARDIAC:  The  patient denies any episodes of chest pain, or chest tightness, or chest  pressure either with activity or at  rest other than that which occurred  at the time of his heart attack in October.  The patient has had some  shortness of breath and orthopnea, but this resolved after his recent  hospitalization and medical therapy with diuresis.  He currently denies  any shortness of breath either with activity or at rest.  He denies any  recent dizzy spells or syncopal events.  RESPIRATORY:  Notably the  absence of productive cough, hemoptysis, wheezing.  GASTROINTESTINAL:  Negative.  The patient has no difficulty swallowing.  He reports normal  bowel function.  He denies hematochezia, hematemesis, or melena.  MUSCULOSKELETAL:  Negative.  NEUROLOGIC:  Notable for some mild problems  with short long-term memory that the patient admits may be slightly  worse since his stroke.  The patient otherwise denies any significant  neurologic symptoms and he denies transient monocular blindness or  transient numbness or weakness involving either upper or lower  extremity.  He has not had problems with recurrent headaches.  GENITOURINARY:  Negative.  PSYCHIATRIC:  Negative.  HEENT:  Negative.  The patient has not seen to dentist in some time, but dental  consultation has been obtained this admission and is pending.  The  patient has no loose teeth or ongoing painful problems in his mouth.   PAST MEDICAL HISTORY:  1. Permanent atrial fibrillation, status post AV node ablation and      permanent dual-chamber ICD pacemaker placement, May 06, 2008.  2. Presumed coronary artery disease, status post acute myocardial      infarction October 2009.  3. Mitral regurgitation.  4. Recurrent congestive heart failure.  5. Recurrent syncopal episodes.  6. Chronic obstructive pulmonary disease.  7. Patent foramen ovale.  8. Cerebrovascular disease, status post acute intracranial bleed, May      2009.  9. Type 2 diabetes mellitus.  10.Hypertension.  11.Remote history of spontaneous pneumothorax.   PAST SURGICAL HISTORY:   Right inguinal hernia repair.   SOCIAL HISTORY:  The patient is married and lives with his wife here in  [place].  He is retired from the post office.  The patient has a  remote history of tobacco use and quit smoking in October 2009.  He  denies significant alcohol consumption.   DRUG ALLERGIES:  None known.   MEDICATIONS PRIOR TO ADMISSION:  1. Nizatidine 150 mg p.o. twice daily.  2. Lasix 20 mg daily.  3. Potassium 10 mEq daily.  4. Coreg 3.125 mg twice daily.  5. Lipitor 80 mg daily.   PHYSICAL EXAMINATION:  GENERAL:  The patient is a thin white male who  appears of stated age in no acute distress.  He is currently in a paced  rhythm with what appears to be underlying atrial fibrillation.  He is  afebrile.  HEENT:  Unrevealing.  NECK:  There is no lymphadenopathy.  There is no jugular venous distention.  CHEST:  Auscultation of the  chest reveals clear breath sounds bilaterally.  No wheezes or rhonchi  noted.  CARDIOVASCULAR:  Regular rate and rhythm.  There is a prominent  grade 3/6 holosystolic murmur heard best at the apex with radiation to  the axilla and back.  No diastolic murmurs are noted.  ABDOMEN:  Soft,  nondistended, and nontender.  There is no obvious ascites.  There are no  palpable masses.  Bowel sounds are present.  EXTREMITIES:  Warm and  adequately perfused.  There is no lower extremity edema.  Distal pulses  are diminished, but palpable in the posterior tibial position and  femoral pulses are strong and palpable bilaterally.  There is no  peripheral stigmata of bacterial endocarditis or embolization.  NEUROLOGIC:  Grossly nonfocal and symmetrical throughout.  RECTAL and  GU:  Both deferred.   DIAGNOSTIC TESTS:  Transesophageal echocardiogram performed June 12, 2008 is reviewed.  This confirms the presence of flail segment of the  anterior leaflet of the mitral valve with severe (4+) mitral  regurgitation.  There is some thickening of the leaflet and  there  appears to be a vegetation.  There does not appear to be any prolapse  involving the posterior leaflet of the mitral valve.  The left atrium is  severely enlarged.  There is a small patent foramen ovale.  There is  trace aortic regurgitation.  There is no obvious left atrial thrombus.  There is mild-to-moderate tricuspid regurgitation with pacing wires  present across the tricuspid valve.  There are no obvious vegetations on  the tricuspid valve or the pacing wires.  There was moderate left  ventricular dysfunction with ejection fraction probably 35-40% and  moderate left ventricular chamber dilatation.   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  The  patient has longstanding history of permanent atrial fibrillation,  congestive heart failure, and recurrent syncopal episodes.  The patient  suffered an acute intracranial bleed while anticoagulated on Coumadin in  May 2009.  Because of this, the patient was not felt to be candidate for  cardiac catheterization or otherwise typical medical therapy for  ischemic heart disease at the time of recent inferior wall ST-segment  elevation myocardial infarction that occurred in October 2009.  The  patient has now been readmitted with positive blood cultures presumably  related to bacterial endocarditis.  The patient has had symptoms of  congestive heart failure that have been improved on medical therapy and  are quite stable presently.  Long-term prognosis with medical therapy  for this patient's underlying severe mitral regurgitation would be  guarded.  However, it remains unclear whether or not this patient should  be considered candidate for surgery due to concerns regarding the  potential for repeat intracranial hemorrhage.  Recent consultation from  the Neurology Service  has been noted with suggestion that the patient  would likely tolerate medical therapy with aspirin, but questions  regarding whether or not the patient could be systemically  anticoagulated and undergo open heart surgery had not been addressed.  The presumed etiology of the patient's original intracranial bleed has  not been discussed.  The patient has yet to undergo cardiac  catheterization.  Upon my initial evaluation, I would tend to believe  that [name] could be candidate for surgical intervention, but I would  defer this decision somewhat until further consultation with our  Neurology colleagues can be addressed.  Left and right heart  catheterization will need to be done.  The patient needs a dental  consultation which is in process.  The patient appears to be responding  favorably to medical therapy for presumed bacterial endocarditis.  Optimal management of the recently placed pacemaker/defibrillator  remains unclear.   PLAN:  We will wait further consultation with the Neurology Team and  results of left and right heart catheterization.  We will continue to  follow along and help with subsequent decision making as desired.      Salvatore Decent. Cornelius Moras, M.D.  Electronically Signed     CHO/MEDQ  D:  06/17/2008  T:  06/17/2008  Job:  [id number]   cc:   Luis Abed, MD, [facility]  Duke Salvia, MD, [facility]  Tinnie Gens A. Tawanna Cooler, MD

## 2010-11-23 NOTE — Discharge Summary (Signed)
NAME:  [name], [name] NO.:  [id number]   MEDICAL RECORD NO.:  [id number]          PATIENT TYPE:  INP   LOCATION:  3702                         FACILITY:  [facility]   PHYSICIAN:  [name], MDDATE OF BIRTH:  17-Jul-1945   DATE OF ADMISSION:  06/07/2008  DATE OF DISCHARGE:  06/09/2008                               DISCHARGE SUMMARY   PRIMARY CARDIOLOGIST:  Charles Abed, MD, [facility]   PRIMARY CARE PHYSICIAN:  Not listed.   PROCEDURES PERFORMED DURING HOSPITALIZATION:  None.   FINAL DISCHARGE DIAGNOSES:  1. Syncopal episode.  A:  Question of vasovagal versus hypotension.  1. Acute on chronic systolic congestive heart failure.  2. Atrial fibrillation flutter.  A:  Status post arteriovenous nodal ablation with placement of  biventricular implantable cardioverter-defibrillator.  B:  Not a Coumadin candidate secondary to hemorrhagic stroke in May  2009.  1. Coronary artery disease.  2. Chronic obstructive pulmonary disease.  3. Diabetes.  4. History of patent foramen ovale.  5. History of intracranial hemorrhage with anticoagulation, not a      Coumadin candidate.   HOSPITAL COURSE:  This is a 65 year old Caucasian male with recent  history of MI, treated medically, also history of AV nodal ablation for  chronic atrial fibrillation and placement of BiV ICD with also history  of CHF and COPD with diabetes, who presented with syncope after standing  up and becoming lightheaded.  He said he hit his forehead on the floor  with resultant headache.  A CT scan was completed and revealed no bleed.  The patient had no chest pain, shortness of breath, or palpitations  preceding syncope.  The patient states he did not have a headache prior  to syncope, but presented to the emergency room for further evaluation  where he was seen and examined by Dr. Arabella [name], fellow for Dr.  Willa [name].  The patient was admitted for observation and to rule out  myocardial  infarction and evaluation of etiology of syncope.  The  patient's blood pressure was 98/54 on initial assessment the following  day.  His ACE inhibitor was discontinued and his beta-blocker was held  the night prior to evaluate its effectiveness on his blood pressure.  The patient's BNP was found to be mildly elevated at 624 and he was  given IV diuresis x1.  The patient did have a CT scan of the chest  showing diffuse anasarca, pulmonary edema, and mesenteric and venous  congestion.  The patient's IV diuresis resulted in a 2-liter urine  output with improvement in symptoms.  The patient's Lasix was changed to  p.o. on June 09, 2008, day of discharge and was monitored for his  responsiveness.  ICD pacemaker was evaluated and found to be functioning  appropriately with no need for discharges or evidence of arrhythmias.   The patient was seen and evaluated by Dr. Verne [name] on day of  discharge and found to be stable.  The patient's weight had decreased  from 76.3 kg to 73.8 kg.  The patient's blood pressure remained mildly  hypotensive with a  blood pressure of 93/62, pulse 85, and respirations  20 with a temperature 97.7.  The patient had no evidence of pulmonary  edema and no clinical evidence of heart failure.  The patient was  recommended to discontinue his ACE inhibitor until followed by Dr. Myrtis [name].  Also, the patient will be followed up in our office in the morning on a  previously scheduled appointment, but prior to this he will have carotid  Doppler studies completed for evaluation of syncope.   DISCHARGE LABORATORY DATA:  Hemoglobin 11.9, hematocrit 33.8, white  blood cells 9.4, and platelets 200.  Sodium 136, potassium 4.5, chloride  98, CO2 of 33, BUN 11, creatinine 0.72, and glucose 132.  BNP 557.  EKG  revealing atrial flutter, V-paced at 80 beats per minute.  Cardiac  enzymes were found to be negative x3 with troponin of 0.02, 0.02, and  0.01 respectively.  Chest  x-ray dated on June 09, 2008, revealed  significant interval improvement in congestive heart failure and  pulmonary edema.   DISCHARGE MEDICATIONS:  1. Furosemide 20 mg 1 p.o. q.a.m.  2. Carvedilol 3.125 mg b.i.d.  3. Nizatidine 150 mg b.i.d.  4. Klor-Con 10 mEq in the a.m.  5. Lipitor 80 mg at bedtime.  6. Nitroglycerin sublingual 0.4 mg p.r.n.  7. Furosemide 40 mg p.r.n. fluid retention with an additional 10 mg of      potassium to be added if needed.  8. THE PATIENT HAS BEEN ADVISED TO STOP TAKING RAMIPRIL.   FOLLOWUP PLANS AND APPOINTMENT:  1. The patient is to follow up with Dr. Willa [name] on a previously      scheduled appointment on June 10, 2008, at 11:30 a.m.  2. The patient is to have carotid Doppler studies on June 10, 2008,      at 10:30 a.m. prior to his scheduled office appointment.  3. The patient has been advised to stop taking ramipril.  4. The patient has been advised to continue taking all other      medications as prescribed.  5. The patient has been advised to bring all medications with him to      office appointment.   Time spent with the patient to include physician time 35 minutes.      Bettey Mare. Lyman Bishop, NP      Charles Carrow, MD  Electronically Signed    KML/MEDQ  D:  06/09/2008  T:  06/09/2008  Job:  [id number]

## 2010-11-23 NOTE — Discharge Summary (Signed)
NAME:  [name], JOLES NO.:  [id number]   MEDICAL RECORD NO.:  [id number]          PATIENT TYPE:  INP   LOCATION:  2018                         FACILITY:  [facility]   PHYSICIAN:  Salvatore Decent. Cornelius Moras, M.D. DATE OF BIRTH:  10-20-1945   DATE OF ADMISSION:  07/14/2008  DATE OF DISCHARGE:  07/28/2008                               DISCHARGE SUMMARY   ADDENDUM:  This is an addendum to the patient's discharge summary  dictated on July 25, 2008.  At time of dictation this date, the  patient is tentatively ready for discharge home in the next 2-3 days  pending he remained stable.  During this time, the patient remained on  IV antibiotics for his endocarditis.  All incisions remained clean, dry,  and intact and healing well.  His vital signs continued to be monitored  and remained stable.  He was arranged for home health nurse to continue  IV antibiotics at home and also to monitor his oxygen saturation.  He  was began on oxygen at home and was maintaining greater than 90% O2 sats  on 2 L.  The patient noted to be in normal sinus rhythm.  He was below  his preoperative weight.  He continued to progress well with cardiac  rehab.   The patient was felt to be stable and ready for discharge home on  July 28, 2008.   For details of the patient's follow-up appointments and discharge  instructions, please see dictated discharge summary.   DISCHARGE MEDICATIONS:  1. Lasix 40 mg b.i.d.  2. Carvedilol 3.125 mg b.i.d.  3. Nizatidine 150 mg b.i.d.  4. Enteric-coated aspirin 325 mg daily.  5. Rocephin 2 grams daily x2 weeks.  6. Oxycodone 5 mg 1-2 tablets q.4 h. p.r.n. pain.  7. Spiriva 18 mcg daily.  8. Potassium chloride 20 mEq b.i.d.  9. Advair 500/50 one puff b.i.d.  10.Combivent inhaler 2 puffs t.i.d.      Theda Belfast, PA      Salvatore Decent. Cornelius Moras, M.D.  Electronically Signed    KMD/MEDQ  D:  10/14/2008  T:  10/15/2008  Job:  [id number]

## 2010-11-23 NOTE — Op Note (Signed)
NAME:  [name], ANTONACCI NO.:  [id number]   MEDICAL RECORD NO.:  [id number]          PATIENT TYPE:  INP   LOCATION:  2018                         FACILITY:  [facility]   PHYSICIAN:  Doylene Canning. Ladona Ridgel, MD    DATE OF BIRTH:  [birth year]   DATE OF PROCEDURE:  07/23/2008  DATE OF DISCHARGE:                               OPERATIVE REPORT   PROCEDURE PERFORMED:  Insertion of a biventricular implantable  cardioverter-defibrillator.   INDICATION:  Nonischemic cardiomyopathy, congestive heart failure class  IV, status post mitral valve replacement with EF postprocedure of 30-  35%, and underlying complete heart block.   INTRODUCTION:  The patient is a 65 year old man who developed Strep  endocarditis with interventional mitral regurg.  The patient had a prior  AV node ablation and BiV ICD inserted, which was removed several weeks  ago with a temporary permanent transvenous pacemaker placed.  He is now  referred after undergoing mitral valve replacement for insertion of a  biventricular ICD secondary to the previous indications and removal of  his previously implanted temporary permanent pacemaker.   PROCEDURE:  After informed consent was obtained, the patient was taken  to the Diagnostic EP Lab in a fasting state.  After usual preparation  and draping, intravenous midazolam was given for sedation.  A 30 mL of  lidocaine was infiltrated in the right infraclavicular region.  A 7-cm  incision was carried out over this region.  Electrocautery was utilized  to dissect down to the fascial plane.  A 10 mL of contrast was injected  in the right upper venous system and demonstrated to be patent.  The  right subclavian vein was then punctured x2, and the St. Jude, model  7122, 60-cm active fixation single coil defibrillation lead, serial  #[id number] was advanced into the right ventricle.  Mapping was carried  out at the final site, and the R-waves measured 7 mV, and the pacing  impedance  with the lead actively fixed was 481 ohms with threshold of  0.7 volts at 0.5 msec.  With the RV lead in satisfactory position, it  was secured to the subpectoralis fascia with figure-of-eight silk  suture.  The sewing sleeve was secured with silk suture.  With the RV  lead in satisfactory position, attention was then turned to placement of  the LV lead.  The coronary sinus guiding catheter was utilized.  Initial  attempts utilizing the St. Jude device was unsuccessful.  A Medtronic  MB2 catheter was utilized, and a 6-French hexapolar EP catheter utilized  to cannulate the coronary sinus.  This was made much more difficult by  the patient's right atrial enlargement and posteriorly displaced  coronary sinus.  Venography of the coronary sinus was carried out.  This  demonstrated a small lateral vein, which was unacceptable for LV lead  placement and a posterior vein, which was utilized for LV lead pacing.  A 0.014 angioplasty guidewire was advanced with a subselective catheter  into the posterior vein.  The subselective catheter was removed.  The  St. Jude QuickFlex XL LV pacing lead, serial #  [id number] was advanced over  the 0.014 angioplasty guidewire into the posterior vein.  It should be  noted that at the most distal portion of the vein, where we initially  went, the patient's threshold was elevated, and diaphragmatic  stimulation was observed.  The lead was retracted back about halfway  from base to apex, and in this location, satisfactory pacing parameters  were demonstrated with pacing impedance of 550 ohms, and a threshold was  around 2.5 volts at 0.5 msec.  A 10-volt pacing did not stimulate the  diaphragm in this location.  With these leads in satisfactory position  and the LV lead liberated from its MB2 guiding catheter, the leads were  secured to the subpectoralis fascia with figure-of-eight silk suture.  The sewing sleeve was also secured with silk suture.  Electrocautery was   utilized to make subcutaneous pocket.  Kanamycin irrigation was utilized  to irrigate the pocket.  Electrocautery was utilized to assure  hemostasis.  The St. Jude Promote Plus BiV ICD, serial [id number] was  connected to the RV and LV leads and placed back in the subcutaneous  pocket.  Generator was secured with silk suture.  At this point, the  pocket was irrigated with kanamycin and incision closed with 2-0 Vicryl  and 3-0 Vicryl.   After the patient was more deeply sedated with fentanyl and Versed, VF  was induced with T-wave shock.  A 17-joule shock was delivered  terminating VF and restoring sinus rhythm.  At this point, no additional  defibrillation threshold testing was carried out, and the incision  closed with 2-0 Vicryl and 3-0 Vicryl.   This demonstrated successful implantation of a biventricular ICD.  The  atrial lead was plugged as was the SVC coil.  The patient was returned  to his room in satisfactory condition.   COMPLICATIONS:  There were no immediate procedure complications.   RESULTS:  This demonstrates successful implantation of a St. Jude  biventricular ICD in a patient with a nonischemic cardiomyopathy, class  IV heart failure, complete heart block, and is status post mitral valve  replacement.      Doylene Canning. Ladona Ridgel, MD  Electronically Signed     GWT/MEDQ  D:  07/23/2008  T:  07/24/2008  Job:  [id number]

## 2010-11-23 NOTE — Assessment & Plan Note (Signed)
[facility]                            CARDIOLOGY OFFICE NOTE   Charles, [name]                        MRN:          [id number]  DATE:11/14/2007                            DOB:          22-Apr-1946    I had seen [name] in approximately 2001.  He has some atrial fib.  After that time, he had some syncope and, in fact, he had an implantable  loop recorder placed in 2002 or 2003.  He is followed by Dr. Tawanna Cooler.  I  have not seen the patient since then.   It turns out that the patient is currently admitted to [facility] with a  CVA.  It seems to affect primarily position for him.  There is a  hemorrhagic CVA.  I cannot tell from the chart if it is felt that he  definitely had an embolus at that became hemorrhagic, or if it was  primarily hemorrhagic.  He was on Coumadin with a therapeutic INR.   He now has to be off Coumadin.  He is going to be going home soon.  He  still has his implantable loop in place.   I was in touch with the EP team and they will be removing his  implantable loop today.   I can see him for cardiology followup, but the key issues will be his  primary care followup and his neurologic followup, and rehab.     Luis Abed, MD, [facility]  Electronically Signed    JDK/MedQ  DD: 11/14/2007  DT: 11/14/2007  Job #: [id number]

## 2010-11-23 NOTE — Assessment & Plan Note (Signed)
OFFICE VISIT   JESTER, KLINGBERG A  DOB:  1945-10-29                                        September 15, 2008  CHART #:  [id number]   The patient returns for further followup today, status post right  miniature thoracotomy for mitral valve replacement and closure of atrial  septal defect on July 17, 2008.  He was last seen here in the office  on August 04, 2008.  Since then, he has continued to do remarkably  well.  The patient is now back to normal physical activity including  doing fairly strenuous chores around the house and yard work.  He states  that he feels great.  He has mild residual soreness in his right  anterior chest wall that is appropriate for his recent surgery.  This  does not bother him much and has not limited his physical activity  whatsoever.  He has no shortness of breath either with activity or at  rest.  He has had no fevers or chills.  He has had no tachypalpitations.  The remainder of his review of systems is unremarkable.  The remainder  of his past medical history is unchanged.   PHYSICAL EXAMINATION:  GENERAL:  A well-appearing male.  VITAL SIGNS:  Blood pressure 98/57, pulse 91 and regular, and oxygen  saturation 98% on room air.  HEENT:  Unrevealing.  NECK:  Supple.  There is no jugular venous distention.  LUNGS:  Auscultation of the chest reveals clear breath sounds which are  symmetrical bilaterally.  No wheezes or rhonchi are noted.  CARDIOVASCULAR:  Regular rate and rhythm.  No murmurs, rubs, or gallops  are noted.  The miniature thoracotomy incision has healed nicely.  ABDOMEN:  Soft and nontender.  EXTREMITIES:  Warm and well perfused.  There is no lower extremity  edema.  The remainder of his physical exam is unrevealing.   IMPRESSION:  The patient has done remarkably well.   PLAN:  In the future, the patient will call or return to see [place] here at  [facility] only should further problems or  difficulties arise.  I think he can return to normal, unrestricted  physical activity.  All of his questions had been addressed.   Salvatore Decent. Cornelius Moras, M.D.  Electronically Signed   CHO/MEDQ  D:  09/15/2008  T:  09/16/2008  Job:  [id number]   cc:   Luis Abed, MD, [employer]  Duke Salvia, MD, [facility]  Tinnie Gens A. Tawanna Cooler, MD  Lacretia Leigh Ninetta Lights, M.D.

## 2010-11-23 NOTE — Assessment & Plan Note (Signed)
[facility]                            CARDIOLOGY OFFICE NOTE   VONZELL, LINDBLAD                        MRN:          [id number]  DATE:08/12/2008                            DOB:          Sep 26, 1945    Mr. Marxen is seen for a followup after his mitral valve surgery.  He is  doing remarkably well.  He has seen Dr. Cornelius Moras back for followup and Dr.  Cornelius Moras agrees that he is doing remarkably well.  The patient ultimately  underwent a mitral valve surgery through a right thoracotomy.  His  mitral valve could not be repaired and he received a tissue prosthesis.  He had an ASD that was closed and his left atrial appendage was removed.  He says he feels the best that he has in 10 years.  His postoperative  antibiotics were completed in the last day or two and his PICC line is  to come out today.  He does not appear to need home oxygen.  He is here  with his wife today.   PAST MEDICAL HISTORY:   ALLERGIES:  The patient cannot use COUMADIN because of bleeding in his  head in the past.   MEDICATIONS:  See the flow sheet.   OTHER MEDICAL PROBLEMS:  See the list below.   REVIEW OF SYSTEMS:  He has no GI or GU complaints.  He has no fevers or  chills.  There are no skin rashes.  He is doing remarkably well.  His  review of systems is negative.   PHYSICAL EXAMINATION:  VITAL SIGNS:  Blood pressure is 100/55 with a  pulse of 70.  His weight is down at 151 pounds.  GENERAL:  The patient is oriented to person, time, and place.  Affect is  normal.  HEENT:  No xanthelasma.  He has normal extraocular motion.  NECK:  There are no carotid bruits.  There is no jugular venous  distention.  CHEST:  His new ICD is in place in the right anterior chest.  CARDIAC:  An S1 with an S2.  There are no clicks or significant murmurs.  His right thoracotomy scar is difficult to find under his right breast.  ABDOMEN:  Soft.  EXTREMITIES:  He has no significant edema.   EKG is  paced.   PROBLEMS INCLUDE:  1. Prior history of hypertension.  No longer a problem as blood      pressure runs low.  2. Inferolateral myocardial infarction on April 25, 2008, most      probably from an embolus as he has normal coronary artery.  3. Left ventricular dysfunction.  This had been severe.  His ejection      fraction then improved up into the 40% range.  We have not yet done      a 2-dimensional echo after his surgery and this will be considered.  4. History of left occipitoparietal intracranial hemorrhage in the      past.  He is never to receive Coumadin.  He is on aspirin.  We had      research  at great length whether he could undergo heart surgery and      eventually this was done.  5. Chronic atrial fibrillation.  He had an atrioventricular node      ablation for rate control and he is pacemaker dependent, although      he has a very slow escape rate.  He could not have a maze procedure      at the time of his mitral surgery.  Dr. Cornelius Moras felt this would be too      much to add to his operation.  However, he did have his atrial      appendage removed.  6. Significant chronic obstructive pulmonary disease.  Despite his      lung problems, he has done remarkably well and his O2 can be      stopped today.  7. Episode of syncope in June 07, 2008, probably related to low      blood pressure with the addition of a small dose of ACE inhibitor.  8. No Coumadin because of prior hemorrhage.  9. Atrial septal defect.  We thought he had a patent foramen ovale,      but in the operating room, it was clear that this was an atrial      septal defect and it was repaired.  10.History of mild renal artery stenosis.  This was documented in the      cath lab.  11.Moderate pulmonary hypertension.  12.Status post Strep viridans endocarditis.  He received 6 weeks of      antibiotics before his surgery and 2 weeks after this.  His PICC      line is to come out today.  13.Status post  severe mitral regurgitation with a flail mitral      leaflet.  He now has a new bioprosthetic mitral valve in place.  14.Dental abnormalities.  He had his teeth cleaned well before his      surgery.  He may need to have a crown done later and he will be      stable for this.  15.History of an implantable cardioverter-defibrillator in place.      This was carefully removed prior to his mitral surgery.  He had a      temporary perm pacer through his surgery and then a new implantable      cardioverter-defibrillator was placed.  Therefore, we feel that we      have absolutely minimized the possibility of any ongoing infection.   The patient looks remarkably good.  I am very pleased with his overall  progress.  Of course, it would be optimal for him to be on Coumadin, but  we cannot do this.  I will see him back for followup in 3 months.     Luis Abed, MD, [facility]  Electronically Signed    JDK/MedQ  DD: 08/12/2008  DT: 08/13/2008  Job #: [id number]

## 2010-11-23 NOTE — Assessment & Plan Note (Signed)
[facility]                                 ON-CALL NOTE   [name], [name]                        MRN:          [id number]  DATE:07/12/2008                            DOB:          01/08/1946    PRIMARY CARDIOLOGIST:  Luis Abed, MD, [facility]   [name]'s wife called because she was concerned.  She stated he is  coming in for upcoming procedure which include the removing a pacemaker  and then going to an open heart surgery.  She wanted to know if she was  supposed to stop his 81 mg of aspirin.  She had not been given  instructions to stop his 81 mg of aspirin.  I advised her that it would  be acceptable to continue it.  [name] is having no other issues or  problems.      Theodore Demark, PA-C  Electronically Signed      Luis Abed, MD, [facility]  Electronically Signed   RB/MedQ  DD: 07/12/2008  DT: 07/13/2008  Job #: [id number]

## 2010-11-23 NOTE — Consult Note (Signed)
NAME:  [name], [name] NO.:  [id number]   MEDICAL RECORD NO.:  [id number]          PATIENT TYPE:  INP   LOCATION:  2923                         FACILITY:  [facility]   PHYSICIAN:  Duke Salvia, MD, FACCDATE OF BIRTH:  [birth year]   DATE OF CONSULTATION:  05/05/2008  DATE OF DISCHARGE:                                 CONSULTATION   Thank you very much for asking [place] to see [name] in  electrophysiological consultation for atrial fibrillation with  uncontrolled ventricular response.   [name] is a 65 year old gentleman with a history of permanent atrial  fibrillation who presented to the hospital about 10 days ago with chest  pain and subsequently was found to be involving an inferolateral  myocardial infarction.  Because of an antecedent history of subdural  hemorrhage in the preceding months, it was elected to treat his  myocardial infarction medically.  His antecedent atrial fibrillation,  however, is becoming increasingly a problem with rapid rates into the  120s-150s range, notwithstanding multiple drugs and limited by  hypotension with blood pressure in the 90-100s.   He has intercurrently developed severe systolic LV dysfunction with  ejection fraction of about 30-35%.   He is not an anticoagulation candidate for the reasons noted above.   Thromboembolic risk factors notable for diabetes and hypertension.   He does have a prior history of syncope for which he underwent loop  recorder implantation and subsequent explantation without a diagnosis.   PAST MEDICAL HISTORY:  In addition to above is notable for:  1. Congestive heart failure.  2. PFO.  3. Spontaneous pneumothorax.   SOCIAL HISTORY:  He is married.  He lives in [place] with his wife.  He smokes but has said he has stopped since he had his heart attack 10  days ago that will be the first time in 56 years.  He does not use  alcohol or recreational drugs.   FAMILY HISTORY:   Noncontributory.   REVIEW OF SYSTEMS:  Broadly reviewed across multiple organ systems and  was noncontributory.   MEDICATIONS:  Yesterday included:  1. Aspirin.  2. Colace.  3. Crestor 40.  4. Metoprolol 37.5 q.6.  5. Digoxin 0.125.  6. Amiodarone 400 q.8 as well as Ambien.   He has no known drug allergies.   PHYSICAL EXAMINATION:  VITAL SIGNS:  On examination today, his blood  pressure was 110/70.  His pulse was about 95 and irregular.  His  respirations were 18.  GENERAL:  He is in no acute distress.  HEENT:  Demonstrated no icterus or xanthoma.  NECK:  His neck veins were 8-10 cm.  His carotids were brisk and full  bilaterally out bruits.  BACK:  Without kyphosis or scoliosis.  LUNGS:  Clear but had decreased breath sounds.  HEART:  Heart sounds were irregular with a displaced PMI.  ABDOMEN:  Soft and nonprotuberant.  EXTREMITIES:  Femoral pulses were 2+.  Distal pulses were trace.  There  was no clubbing, cyanosis, or edema.  NEUROLOGICAL:  Grossly normal.   Electrocardiograms demonstrated an atrial fibrillation with  rates in the  110 range.  Around the time of April 25, 2008, early the morning, he  has striking ST-segment elevation in the lateral leads and ST elevation  inferior leads which then gradually resolved over the ensuing days with  T-wave inversions then noted in the lateral leads.  His intervals  included a QRS duration of 82 milliseconds and QT interval about 350  milliseconds.   Echocardiogram demonstrated significant LV dysfunction as noted.   Creatinine was 0.78, hemoglobin of 13.1, and hemoglobin A1c was 6.5.   IMPRESSION:  1. Atrial fibrillation with a rapid ventricular response - permanent.  2. Two prior hemorrhagic cerebrovascular accident precluding Coumadin.  3. Intercurrent inferolateral myocardial infarct being treated      medically with subsequent deterioration of left ventricular      systolic function from 55 to 25.  4. Hypotension  interfering with use a rate controlling medications.  5. Chronic obstructing pulmonary disease.  The patient stopped      smoking.  6. Prior syncope with a negative loop recorder.  7. Diabetes.   DISCUSSION:  [name] has atrial fibrillation with a rapid ventricular  response deteriorating LV systolic function and hypotension precluding  further up titration of medications.  Amiodarone was utilized over the  weekend; has a class IIB indication here, but I can share the concerns  of Dr. Myrtis Ser about not using in long-term for rate control.  Thus I think  our best options are especially with his prior intracerebral bleed  precluding coumadinization which prevents AFib ablation, his prior  syncope, and hypotension that AV junction ablation with CRTD  implantation makes the most sense.  I have reviewed this with the  patient in the wake of Dr. Myrtis 65 Ser and Loura Pardon having done so  extensively.  He understood, agreed, and was willing to proceed.  We  discussed the potential benefits as well as potential risks including  but not limited to death, perforation, infection, and lead dislodgement.  He understands these risks and is willing to proceed.   I told him we would plan to put him in an intensive care unit as he has  some chance of being device dependent following the procedure.      Duke Salvia, MD, [facility]  Electronically Signed     SCK/MEDQ  D:  05/06/2008  T:  05/07/2008  Job:  [id number]

## 2010-11-23 NOTE — Assessment & Plan Note (Signed)
[facility]                         GASTROENTEROLOGY OFFICE NOTE   NAME:COOKNygel, Prokop                        MRN:          [id number]  DATE:02/07/2007                            DOB:          Apr 20, 1946    PROBLEM:  Mr. Kataoka has returned to set up his colonoscopy.  He was seen  in May 2007 for a similar problem.  He takes Coumadin for atrial  fibrillation.  He has no current GI complaints.   EXAM:  Pulse 76, blood pressure 106/64, weight 197.   ASSESSMENT AND PLAN:  I will schedule Mr. Coole for a screening  colonoscopy.  Coumadin will be held for 5 days.     Barbette Hair. Arlyce Dice, MD,FACG  Electronically Signed    RDK/MedQ  DD: 02/07/2007  DT: 02/08/2007  Job #: [id number]   cc:   Tinnie Gens A. Tawanna Cooler, MD

## 2010-11-23 NOTE — Assessment & Plan Note (Signed)
[facility]                            CARDIOLOGY OFFICE NOTE   CUONG, MOORMAN                        MRN:          [id number]  DATE:06/25/2008                            DOB:          19-Jun-1946     [name] is here today for cardiology followup after being discharged  from [facility] on June 19, 2008.  He is here with his wife.  His  problems are complex.  I have spoken with the patient and his wife  directly in counseling them about his current medical status for greater  than 1 hour today.   He is being treated with home IV antibiotics for endocarditis.  We are  trying to decide if he can have mitral valve surgery.  I am trying to  arrange for home oxygen and arrange to be sure that he has his followup  dental work.  I have explained all the aspects about the pros and cons  of the replacement of his pacemaker.   The patient is not having chest pain.  He is having some shortness of  breath at night.  We need to arrange for oxygen for him to sleep.   PAST MEDICAL HISTORY:  Allergies, the patient cannot take COUMADIN  because of his history of CNS bleeding.   MEDICATIONS:  1. Furosemide 20.  Today we will be checking a BMET and increasing his      furosemide 40 mg daily.  2. Potassium is 10 mEq today.  3. Carvedilol 3.125 b.i.d.  4. Aspirin 81.  5. Rifampin 300 mg b.i.d. p.o.  6. Ceftriaxone 2 grams IV daily.   OTHER MEDICAL PROBLEMS:  See the list below.   REVIEW OF SYSTEMS:  He is not having any fevers or chills.  He is not  having any headaches.  There are no skin rashes.  He has no GI or GU  symptoms.  Otherwise his review of systems is negative.   PHYSICAL EXAM:  VITAL SIGNS:  Today blood pressure is 98/60 with a pulse  of 75 and this is a usual blood pressure for him.  GENERAL:  He is here with his wife today.  The patient is oriented x3.  Affect is normal.  HEENT:  Reveals no xanthelasma.  He has normal extraocular  motions.  NECK:  There are no carotid bruits.  There is no jugular venous  distention.  LUNGS:  Lungs are clear.  Respiratory effort is not labored.  CARDIAC:  Exam reveals S1 and S2.  There is a murmur of mitral  regurgitation.  ABDOMEN:  His abdomen is soft.  He does have 1+ edema now in his left  leg.   The patient had labs that I have reviewed from December 14.  His white  count was 8200.  Hemoglobin 11.2.  BUN is 15 with a creatinine of 0.67.  Potassium is 4.7.   PROBLEMS INCLUDE:  #1.  Prior history of hypertension.  He is no longer  hypertensive and tends to run a low blood pressure.  #2.  History of an inferolateral MI  occurring on April 25, 2008.  This  was most likely from an embolus.  He has only very minimal coronary  artery disease.  #3.  Severe left ventricular dysfunction related to his infarct.  His  ejection fraction may now be as high as 40%.  However, this is in face  of significant mitral regurgitation.  #4.  History of a left occipital parietal intracranial hemorrhage in the  past.  He is not to have Coumadin.  He is on aspirin.  He is allowed to  have low dose heparin.  However, we are still deciding as to the heparin  that will be needed for potential heart surgery.  #5.  Chronic atrial fibrillation.  He has an AV node ablation.  He still  has atrial fibrillation however.  We are hopeful that with heart surgery  he will have his atrial appendage removed and a maze procedure such that  this will reduce his ongoing clot risk.  #6.  Significant COPD.  #7.  History of syncope on June 07, 2008, that could have been  related to a small dose of an ACE inhibitor along with his low blood  pressure.  #8.  Episode of volume overload on June 07, 2008, that is stable,  although I believe he is a little wet at this time and his diuretic dose  will be increased.  #9.  No Coumadin because of his prior hemorrhage.  #10.  History of a patent foramen ovale.  This  would be repaired at the  time of heart surgery.  #11.  Mild renal artery stenosis.  The right renal artery documented in  the cath lab.  #12.  Moderate pulmonary hypertension.  #13.  Strep viridans endocarditis.  This is being treated with  antibiotics.  #14.  Severe mitral regurgitation with a flail mitral leaflet.  We are  hopeful that he can have surgery for this.   We will try to arrange for oxygen for him at home because he is having  difficulties resting at night.  I understand now that he has not  followed through on having his dental cleaning.  We will make sure that  this arranged.  Dr. Kristin Bruins called me to say that the patient cancelled  his appointment.  He will have a BMET.  His Lasix will be increased to  40 mg daily.  He is receiving some heparin and keeping his PICC line  opened.  I will be checking to make sure that this is okay for him.  I  will then be arranging for him to remain on antibiotics until the next  step.  We will be using at least 6 weeks total.  He is now 2 weeks into  therapy.  I will work further on the timing of further visits.     Luis Abed, MD, [facility]  Electronically Signed    JDK/MedQ  DD: 06/25/2008  DT: 06/25/2008  Job #: [id number]

## 2010-11-23 NOTE — Op Note (Signed)
NAME:  [name], [name] NO.:  [id number]   MEDICAL RECORD NO.:  [id number]          PATIENT TYPE:  INP   LOCATION:  2923                         FACILITY:  [facility]   PHYSICIAN:  Duke Salvia, MD, FACCDATE OF BIRTH:  [birth year]   DATE OF PROCEDURE:  DATE OF DISCHARGE:                               OPERATIVE REPORT   PREOPERATIVE DIAGNOSIS:  Uncontrolled atrial fibrillation.   POSTOPERATIVE DIAGNOSIS:  Uncontrolled atrial fibrillation.   PROCEDURES:  Arteriovenous junction ablation; His bundle recording.   Following obtaining informed consent and the patient having had a CRTD  implanted, the patient's right groin was prepped.  Catheterization was  performed with local anesthesia.  An SR-0 sheath was used and a 4-mm tip  catheter was inserted to the AV junction where H-V interval was recorded  60 milliseconds, 53 seconds of RF was applied where the His bundle  potential could be recorder with a small atrial signal.  At the end,  patient had a junctional rhythm of 1700 milliseconds.  The patient was  watched and the underlying junctional rhythm persisted.  The sheath was  removed under fluoroscopic guidance.  The patient tolerated the  procedure without apparent complication.      Duke Salvia, MD, [facility]  Electronically Signed     SCK/MEDQ  D:  05/06/2008  T:  05/07/2008  Job:  [id number]

## 2010-11-23 NOTE — Consult Note (Signed)
NAME:  [name], [name] NO.:  [id number]   MEDICAL RECORD NO.:  [id number]          PATIENT TYPE:  INP   LOCATION:  3020                         FACILITY:  [facility]   PHYSICIAN:  Rollene Rotunda, MD, FACCDATE OF BIRTH:  10-07-1945   DATE OF CONSULTATION:  DATE OF DISCHARGE:                                 CONSULTATION   PRIMARY CARE PHYSICIAN:  Dr. Tawanna Cooler.   PRIMARY CARDIOLOGIST:  1. Luis Abed, MD, FACC  2. Duke Salvia, MD, [facility], Electrophysiologist.   REASON FOR CONSULTATION:  Embolic CVA, on Coumadin, in the setting of  atrial fibrillation.   HISTORY OF PRESENT ILLNESS:  This is a 66 year old Caucasian male with  known history of chronic atrial fibrillation, on Coumadin therapy with  complaints of headache, starting 4 days ago with increasing confusion  and staggering, along with increased headache pain.  The wife brought  the patient to the emergency room.  A CT scan was completed, showing a  large parietal parenchymal bleed.  The patient was diagnosed with CVA  and brought to the neuro unit.  On admission, the patient's INR was 2.3.  We  were asked to see the patient concerning Coumadin therapy in this  setting.  The patient has not seen a Cardiologist in years, only sees  primary care physician, Dr. Tawanna Cooler, at [facility].  The patient  does have an implantable loop recorder, which was placed in 2003, which  remains in place.   REVIEW OF SYSTEMS:  The patient denies any falls or injuries.  He did  complain of a headache, confusion, aphasia, and unsteady gait.  Denied  any chest pain or shortness of breath.   PAST MEDICAL HISTORY:  1. Chronic atrial fibrillation, greater than 20 years.  2. Type 2 diabetes.  3. COPD.  4. Implantable loop recorder, placed November 2002.  5. EP study on November 2001, secondary to syncope.  6. CHF.  7. Remote history of spontaneous pneumothorax.  8. The patient had a Cardiolite in 2003, which was negative for      ischemia.  Last echocardiogram revealed an EF of 40%, in November      2003.   SOCIAL HISTORY:  He lives in [place] with his wife.  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  He is married.  He is 1 to  1-1/2 pack a day smoker for 40 plus years.  Negative EtOH.  Negative for  drug use.   FAMILY HISTORY:  Mother with CVA, father with cancer, and brother with  cancer.   CURRENT MEDICATIONS:  At home:  1. Coumadin, for PT/INR.  2. Atenolol 100 mg b.i.d.  3. Nystatin 150 mg b.i.d.  4. Potassium 20 mEq daily.  5. Lanoxin 0.25 mg daily.  6. Cartia XT 240 mg daily.  7. Furosemide 40 mg daily.  8. Pepcid 20 mg twice a day.   ALLERGIES:  No known drug allergies.   CURRENT LABS:  Hemoglobin 13.0, hematocrit 30.0, white blood cells 11.9,  and platelets 158.  Sodium 133, potassium 3.8, chloride 99,  CO2 27, BUN  11, creatinine 7.71, and glucose 147.  Total bili 5.3, alkaline  phosphatase 67, AST 20, ALT 19, total protein 6.1, and albumin 3.1.  Hemoglobin A1c 6.5.  PT 15.8 and INR 1.2.   PHYSICAL EXAMINATION:  VITAL SIGNS:  Blood pressure 120/70, pulse 88,  respirations 15, temperature 97.6, and O2 sat 93% on 2 L.  GENERAL:  He is awake and alert.  When questioned, responses easily.  HEENT:  Head is normocephalic and atraumatic.  Eyes, PERRLA.  Mucous  membranes of mouth, pink moist.  Tongue is midline.  NECK:  Supple.  There is no JVD.  No carotid bruits appreciated.  CARDIOVASCULAR:  Irregular rate and rhythm with 2/6 systolic murmur and  AFib, radiating to the axilla.  LUNGS:  Poor inspiratory effort with some crackles noted.  ABDOMEN:  Soft, nontender, with 2+ bowel sounds.  EXTREMITIES:  Without clubbing, cyanosis, or edema.  MUSCULOSKELETAL:  The patient moves all extremities without any  weakness.  NEUROLOGIC:  Cranial nerves II-XII are grossly intact; however, the  patient is slow to respond with some memory deficit, knows his wife's  name and has some  confusion about where he is.   RADIOLOGY:  1. CT scan revealed a large parietal parenchymal hemorrhage, evolving      with now slightly increased mass effect.  2. Chest x-ray revealing left lower lobe atelectasis.  3. EKG, atrial fibrillation with a ventricular rate of 150 per minute.   IMPRESSION:  1. Acute hemorrhagic cerebrovascular accident in the setting of      Coumadin treatment.  2. History of chronic atrial fibrillation.  3. Alcohol and tobacco abuse.  4. Chronic obstructive pulmonary disease.  5. Diabetes.   PLAN:  The patient is seen and examined by myself and Dr. Rollene Rotunda.  The patient's AFib is rate controlled.  He is on chronic  Coumadin therapy with a therapeutic INR.  He did have a history of  syncope with a loop recorder in 2003, but no syncope since the loop was  implanted, now with hemorrhagic stroke, despite therapeutic INR.   Our plan will be to discontinue the Coumadin.  Obviously, he will no  longer be on Coumadin for a good while now.  His rate is well-controlled  on current medication regimen.  We will remove the loop recorder, while  in the hospital and recheck echocardiogram, secondary to mitral  regurgitation and make further recommendations depending upon hospital  course.      Bettey Mare. Lyman Bishop, NP      Rollene Rotunda, MD, [facility]  Electronically Signed    KML/MEDQ  D:  11/12/2007  T:  11/13/2007  Job:  [id number]   cc:   Tinnie Gens A. Tawanna Cooler, MD

## 2010-11-23 NOTE — Assessment & Plan Note (Signed)
[facility]                            CARDIOLOGY OFFICE NOTE   Charles, [name]                        MRN:          [id number]  DATE:06/27/2008                            DOB:          [birth year]    I saw [name] in the office on June 25, 2008.  After that time, I  called Dr. Avie Echevaria to ask him to review the entire neurologic status  of the patient.  He was nice enough to help with this and contacted me  back.  During his review, he actually reviewed all of the scans that the  patient has had of the head.  The patient had a CT scan of the head on  June 07, 2008, during a brief hospitalization.  Dr. Sandria Manly and  Radiology decided that in fact there was a slight change on that study  at that time.  It is therefore recommended that [name] have a followup  CT to see how this area has evolved overtime.   I will be arranging a CT of the head without contrast for [name].  All of this information will be integrated into the multiple  decisions that are being made about the approach to his care.     Luis Abed, MD, [facility]  Electronically Signed    JDK/MedQ  DD: 06/27/2008  DT: 06/27/2008  Job #: [id number]   cc:   Salvatore Decent. Cornelius Moras, M.D.  Genene Churn. Love, M.D.

## 2010-11-23 NOTE — Discharge Summary (Signed)
NAME:  [name], [name] NO.:  [id number]   MEDICAL RECORD NO.:  [id number]          PATIENT TYPE:  INP   LOCATION:  2003                         FACILITY:  [facility]   PHYSICIAN:  Luis Abed, MD, FACCDATE OF BIRTH:  June 28, 1946   DATE OF ADMISSION:  06/10/2008  DATE OF DISCHARGE:  06/19/2008                               DISCHARGE SUMMARY   [name] is being discharged from [facility] today after or  treatment for bacterial endocarditis.  See my complete H and P done on  June 10, 2008.  The patient had strep viridans bacteremia and was  seen in the office on December 1. As soon as this was learned he was  admitted to the hospital.  He was stable during his hospital stay.  There was an extensive evaluation done to help to decide how to approach  his endocarditis.  This included being seen by infectious disease.  He  was placed on ceftriaxone (Rocephin) and rifampin.  His blood cultures  became negative after 2 days of treatment.  Multiple consultations were  done as outlined below in the problem list to decide the best approach  for his care.   Laboratory studies during the hospitalization included his blood work.  His initial white count was mildly elevated.  This then came down into  the range of 8100.  His hemoglobin at the time of discharge is 10.9.  Blood cultures were done on June 10, 2008, the day of admission.  Those were positive also for strep viridans.  Cultures were repeated on  June 12, 2008.  As of today, June 19, 2008, these cultures remain  negative.  The patient underwent cardiac catheterization.  See the  discussion below.  He had carotid Dopplers done.  This revealed mixed  plaque bilaterally. Vertebral flow was antegrade bilaterally.  There  were no significant internal carotid artery stenoses.  The patient had a  2-D echo on June 12, 2008.  He had transesophageal echo on June 12, 2008.  He had a follow-up 2-D echo  on June 18, 2008.  See the  discussion below.  He had venous Dopplers of his right lower extremity.  There was no evidence of DVT, superficial thrombosis or Baker's cyst. He  was seen also in consultation by dental medicine. He had an ortho  pantogram. There was no obvious periapical abscess.  His renal function  remained normal.  Most recent BUN was 9 with a creatinine 0.67. Sodium  135 and potassium 3.9.  Blood pressure remained low in the range of  95/70.   HOSPITAL COURSE:  The patient did not show any signs of heart failure  while in the hospital.  He paced and had no signs of AV block.  Cardiac  catheterization was done on June 17, 2008.  This was done with a  right heart cath and coronaries.  No left heart cath was done.  There  was also distal aortography.  See the cath report for complete data.  Analysis of the coronary arteries revealed that there was possible 30%  narrowing of the distal LAD.  Otherwise there was no marked coronary  disease.  Distal aortography revealed the renal arteries to be patent.  There was perhaps 30-40% right renal artery ostial narrowing.  The aorta  distal was smooth.  There was calcification.  There were no critical  stenoses.  The left renal artery was widely patent.  Right heart cath  was done.  The central aortic pressure was 94/54.  Right atrial pressure  6, right ventricular pressure 50/6, pulmonary artery pressure 49/17 with  a mean of 32. Pulmonary capillary wedge pressure 19.  This data was  compatible with pulmonary hypertension that appeared to be related to  mitral regurgitation with elevated V-waves.  There were no critical  coronary stenoses.  There was 30-40% right renal artery ostial narrowing  without significant obstruction.  Full consultation was done by Dr. Terrace Arabia  of neurology.  Further discussion took place with Dr. Pearlean Brownie, who also  knows the patient.  It was felt that the patient could be placed on  aspirin.  It was hoped  that Coumadin would always be avoided.  Further  discussion with Dr. Pearlean Brownie by me led to the understanding that the  patient probably could have short-term heparin if needed, but hopefully  with no bolus heparin.  The patient was also seen in consultation by Dr.  Cindra Eves of dental medicine.  Overall it was felt that there was  no obvious abscess.  It was felt that he could have a follow-up careful  dental cleaning on antibiotics and follow-up of prior endodontic work  but that he had no obvious acute infection.  Consultation was obtained  from Dr. Tressie Stalker of [facility].  He continues to follow the patient very  carefully concerning the possibility of mitral valve surgery for his  endocarditis with flail mitral leaflet.  There is ongoing question as to  the safety of the overall procedure with heparin in the operating room  for the pump, and this issue will be discussed further at to be very  clear going forward.  The patient was also seen by Dr. Johny Sax  of infectious disease.  He helped to oversee the patient's antibiotic  regimens and the patient is on ceftriaxone and rifampin for at least 4  weeks.  The patient was seen by Dr. Hillis Range and Dr. Sherryl Manges of  electrophysiology.  It is felt that his original pacer ICD will have to  be replaced at some point and the timing is to be carefully related to  if and when the patient has heart surgery.  The patient does have an  underlying rhythm if his unit is turned off completely.   DISCHARGE MEDICATIONS:  The exact dosing and discharge medication list  will be added as an addendum to this note.  The medicines will include  Lasix 20 mg, potassium 10 mEq, Coreg 3.125 b.i.d., rifampicin 300 p.o.  b.i.d., aspirin 81 mg daily, chlorhexidine oral rinse, ceftriaxone  (Rocephin) 1 gram IV q.24h.   FINAL DIAGNOSES WITH DISCUSSION:  1. Prior history of hypertension, but this is no longer an issue.  The      patient's blood  pressure tends to be on the low side and he is      stable with this.  2. History of inferolateral myocardial infarction, occurring on      April 25, 2008.  This was treated medically because the patient      was felt not to be  able to obtain any type of anticoagulants at      that time.  Cardiac catheterization now shows that the patient has      only minimal coronary disease.  Presumably his infarct on April 25, 2008 was related to some type of embolus.  3. Severe left ventricular dysfunction.  The patient's ejection      fraction is reduced.  He had further echoes during this      hospitalization.  His ejection fraction at the time of discharge      may be as high as 40% with some dilatation of his ventricle and      decreased motion of the septum and apex.  He is receiving low-dose      beta blockade at this time.  He will not receive an ACE inhibitor      because of his low blood pressure.  4. History of left occipital parietal intracranial hemorrhage in the      past.  The patient has had repeated CT scans.  This area is      completely stable.  Historically it is my understanding that this      area was not wedge shaped, and it was felt not to have been an      embolus.  There will be further discussion about this.  It is felt      by all that he is quite stable.  He has had complete neurologic      recovery.  He is allowed now to be on aspirin.  He cannot receive      Coumadin.  The issue of Plavix is not issue at this time.  The      issue possible anticoagulation at the time of possible mitral valve      surgery continues to be discussed, and this will be reviewed      carefully.  It is felt that he could receive some heparin.  It is      felt that we should try to limit any type of bolus IV heparin.  How      this relates to the operating room will be discussed further.  5. Chronic atrial fibrillation.  During April 25, 2008 admission the      patient's atrial  fibrillation rate could not be controlled.  It is      possible that this played a role in his LV dysfunction also.      Ultimately he received AV nodal ablation and pacemaker.  At that      time a biventricular pacer was placed also because of significant      left ventricular dysfunction.  This was before we had any data      about his coronary arteries.  He cannot receive Coumadin.  He is on      aspirin.  If he can undergo surgery for his mitral valve it is      hoped that his left atrial appendage could be tied off and that      possibly he could receive a maze procedure.  This would decrease      the concern for his need for Coumadin in the future.  6. Significant chronic obstructive pulmonary disease.  This has been      stable.  7. History of syncope on June 07, 2008.  It was felt that this      episode was probably related to the beginning of very  low dose of      ACE inhibitor that I had started specifically because of his LV      dysfunction.  He may have had a vasovagal component to this.  This      was not a neurologic event.  8. Episode of volume overload with congestive heart failure when he      was hospitalized on July 07, 2008.  Fortunately with diuresis      he is stabilized, and despite his LV dysfunction and mitral      regurgitation he is remaining stable on low dose diuretics.  9. No Coumadin due to his prior hemorrhagic stroke.  10.Problem history of a patent foramen ovale.  This could be repaired      if he has heart surgery.  11.Mild discomfort in his right leg on admission.  Venous analysis      with Doppler of this area showed no significant abnormalities.  12.Mild renal artery stenosis of the right renal artery documented in      the cath lab.  13.Mild to moderate pulmonary hypertension related to the patient's      mitral regurgitation at this time.  14.Strep viridans endocarditis.  A dental evaluation has not shown any      obvious abscess.  The  plan will be for the patient to have dental      cleaning done within the next week as an outpatient while on      antibiotics.  There is question of follow-up of his endodontic      status.  Also need for his chipped tooth to be repaired in the      future.  The issues will be managed carefully while the patient is      on antibiotics before any potential mitral valve surgery.  The      patient will remain on ceftriaxone for at least [redacted] weeks along with      rifampin.  15.Severe mitral regurgitation with flail mitral leaflet and a large      vegetation on the mitral valve related to prior mitral valve      disease and current mitral endocarditis.  It is hoped that the      patient will be able to be a surgical candidate.  He is followed by      Dr. Tressie Stalker.  He may be able to receive mitral valve repair.      If not, there would have to be a mitral replacement with a tissue      valve as he cannot receive Coumadin.  He may be a candidate for      atrial appendage occlusion and for a maze procedure.  The issue of      possible surgery is still pending.  In summary, the ongoing plan      will be for continued electrophysiology follow-up by my      communicating with Dr. Graciela Husbands about the timing of his removal of his      current ICD with a new device placed.  All decisions about the      final device to be chosen are pending.  There will be further      discussion about the risk of heparinization at the time of possible      mitral valve surgery, and this discussion we will help with Dr.      Cornelius Moras come to his final decision.  His endocarditis will be treated  with antibiotics.  I will be sure that his antibiotics are      continued until all decisions and therapy are complete.  His dental      work arrangements will be made.  I will be in contact on an ongoing      basis ongoing basis with Dr. Tressie Stalker.   There will be an addendum to this note dictated listing exactly what  his  discharge meds are and exactly what the specifics of all of his post  hospital visits will be, time and place.      Luis Abed, MD, [facility]  Electronically Signed     JDK/MEDQ  D:  06/19/2008  T:  06/19/2008  Job:  [id number]   cc:   Salvatore Decent. Cornelius Moras, M.D.  Luis Abed, MD, [facility]  Charlynne Pander, D.D.S.  Lacretia Leigh. Ninetta Lights, M.D.  Pramod P. Pearlean Brownie, MD

## 2010-11-23 NOTE — Op Note (Signed)
NAMEREINHART, SAULTERS NO.:  [id number]   MEDICAL RECORD NO.:  [id number]          PATIENT TYPE:  INP   LOCATION:  2041                         FACILITY:  [facility]   PHYSICIAN:  Doylene Canning. Ladona Ridgel, MD    DATE OF BIRTH:  06/23/1946   DATE OF PROCEDURE:  07/15/2008  DATE OF DISCHARGE:                               OPERATIVE REPORT   PROCEDURE PERFORMED:  Removal of a previously implanted biventricular  ICD and insertion of a new temporary permanent RV pacing lead.   INDICATIONS:  The patient is status post ICD insertion who essentially  developed Strept viridans endocarditis with presumed involvement of the  defibrillator lead.  He has complete heart block.   PROCEDURE:  After informed consent was obtained, the patient was taken  to the diagnostic EP lab in fasting state.  After usual preparation and  draping, no intravenous fentanyl or midazolam was given secondary to the  patient's oxygen saturation which started the case at 80%.  ABG was  obtained to confirm the pulse oximetry.  Findings demonstrating a pH of  7.42, a CO2 of 54, and a pO2 of 44.  Despite these findings fairly  stable from a respiratory status, the left subclavian vein was punctured  and the St. Jude 7-French active fixation pacing lead advanced into the  right ventricle.  Mapping was carried out at the final site.  The R-  waves (paced) were 5 mV and the pacing threshold was less than a volt of  0.5 msec.  The St. Jude lead was a 1788T, 58-cm lead, serial number  [id number].  With these satisfactory parameters, the lead was secured to  the skin with a figure-of-eight silk suture and the sewing sleeve was  also secured to the skin with silk suture.  At this point, the old ICD  pocket incision was carried out and this was a 7-cm incision.  Electrocautery was utilized to dissect down to the ICD pocket and was  removed with gentle traction.  Electrocautery was utilized to free up  the ICD lead and the LV  lead without particular difficulty.  The sewing  sleeves were also freed up and the silk suture was cut.  At this point,  the active fixation defibrillation lead was a helix, was retracted back  into the body of the RV lead, and then the lead was pulled out with  gentle traction.  In the same way, the LV lead was also pulled out.  Pressure dressing was placed.  Blood pressure was obtained demonstrating  no hemodynamic sequela of removal of the lead.  Electrocautery was  utilized to assure hemostasis.  Gentamicin irrigation was utilized to  irrigate the pocket, and the incision closed with 2-0 Vicryl and 3-0  Vicryl.  At this point, the St. Jude single-chamber pacemaker was  connected to the newly implanted temporary permanent RV pacing leads and  placed outside the skin and secured with silk suture.  An OpSite was  placed over this, and a pressure dressing placed over the old  defibrillator incision and pocket, and the patient was  returned to his  room in satisfactory condition, albeit with reduced oxygen saturation.   COMPLICATIONS:  There were no immediate procedure complications.   RESULTS:  This demonstrates successful extraction of a previously  implanted BiV ICD insertion of a new device.  A new temporary permanent  RV pacing lead in a patient with complete heart block and Strept  viridans endocarditis for who was previously scheduled for mitral valve  surgery.       Doylene Canning. Ladona Ridgel, MD  Electronically Signed     GWT/MEDQ  D:  07/15/2008  T:  07/15/2008  Job:  [id number]   cc:   Duke Salvia, MD, [facility]  Luis Abed, MD, [facility]

## 2010-11-23 NOTE — Discharge Summary (Signed)
NAME:  [name], [name]                 ACCOUNT NO.:  [id number]   MEDICAL RECORD NO.:  [id number]          PATIENT TYPE:  INP   LOCATION:  3020                         FACILITY:  [facility]   PHYSICIAN:  Pramod P. Pearlean Brownie, MD    DATE OF BIRTH:  May 12, 1946   DATE OF ADMISSION:  11/10/2007  DATE OF DISCHARGE:  11/15/2007                               DISCHARGE SUMMARY   DIAGNOSES AT THE TIME OF DISCHARGE:  1. Left temporo-parieto-occipital hemorrhage in the setting of atrial      fibrillation on chronic Coumadin with INR of 2.3.  2. Atrial fibrillation followed by cardiology.  3. Diabetes.  4. Congestive heart failure.  5. Syncope.  6. Incidental patent foramen ovale found during workup.  7. Chronic obstructive pulmonary disease.  8. No history of remote spontaneous pneumothorax.  9. Status post loop recorder removal this admission had been present      for several years, but had been implanted in 2002.   MEDICINES AT THE TIME OF DISCHARGE:  1. Nizatidine 150 mg two tablets a day.  2. Atenolol 100 mg b.i.d.  3. Potassium 10 mEq a day.  4. Lanoxin 250 mcg a day.  5. Cartia XT 240 mg a day.  6. Lasix 20 mg a day.  7. No aspirin, Motrin, or Coumadin or any containing products.   STUDIES PERFORMED:  1. Chest x-ray shows left lower lobe atelectasis.  Initial CT of the      head shows a 3.7- x 5-cm left temperoparietal hematoma with small      amount of adjacent subarachnoid hemorrhage.  Follow up CT at 24      hours shows left parietal parenchymal hemorrhage evolving with      slightly increased mass effect, but no new hemorrhage or      hydrocephalus.  2. Follow up CT at 3 days shows stable exam with respect to hematoma      on the left, perhaps some minimal increase in vasogenic edema.  3. Transthoracic echocardiogram showed EF of 55%, aortic valve      calcified, now the aortic root dilatation.  Mild mitral valve      regurgitation.  Left atrium markedly dilated.  Right atrium  moderately to markedly dilated and PFO seen due to marked atrial      enlargement.  4. Carotid Doppler shows no ICA stenosis, vertebral artery flow      antegrade.  5. EKG shows AFib and atrial flutter.  6. Loop recorder removed Nov 14, 2007, by cardiologist without      incident.   LABORATORY STUDIES:  CBC with hemoglobin 17.2, hematocrit 50, white  blood cells 11.9, platelets 168,000, and neutrophils 90.  Otherwise,  normal chemistry.  Initial sodium 138 down to 133 and glucose 147.  Otherwise, normal coagulation studies on admission with INR 2.3 down to  1.2.  Liver function tests with total bilirubin 5.3, albumin 3.1,  otherwise normal.  Urinalysis was 0-2, red blood cells 0-2, white blood  cells, and rare bacteria.  No leukocyte esterase and hemoglobin A1c 6.5.  Alcohol level less  than 5.   HISTORY OF PRESENT ILLNESS:  [name] is a 65 year old left-  handed Caucasian male with history of atrial fibrillation who was on  Coumadin therapeutic at 2.3.  According to his wife on Thursday night,  he complained of posterior headache and the next few days seemed to be  increasingly confused and staggering.  She brought him to the emergency  room on Nov 10, 2007, where a CT revealed a large intracranial hemorrhage  in the left occipital parietal region.  The patient was aphasic and  confused and not really able to give a very good history.  The patient's  wife states he did not have any falls as far as she knew.  He was  admitted to the Neuro ICU.  His anticoagulation was reversed with fresh  frozen plasma and vitamin K.  He was not a TPA candidate secondary to  hemorrhage.   HOSPITAL COURSE:  INR was reversed to an INR of 1.4 within the first 24  hours using fresh-frozen plasma and vitamin K.  He was initially placed  on a Cardizem drip for rapid ventricular rate.  This was able to be  weaned.  He was transferred to the floor.  It was felt his hemorrhage  was likely secondary to  Coumadin, but there was no way to identify as it  was possibly a hemorrhagic transformation though the location leads more  towards a hypertensive spontaneous hemorrhage regardless we cannot do an  MRI at this time secondary to implantable loop recorder that was placed  in November 2002.  Cardiology was consulted due to the loop recorder and  atrial fibrillation and plans were made to remove the loop recorder.  Loop recorder was removed this hospitalization without difficulty.  Plans are to resume aspirin within 1 month if stable from the stroke  standpoint for his atrial fibrillation.  There is no indication for  Coumadin in the future due to a cerebral hemorrhage.  There is some  discussion by the cardiologist that the patient may be a candidate down  the road for the left atrial occlusive device.  He has follow up with  the [facility] on Nov 29, 2007, at 9:40 a.m.  From the stroke  standpoint, he was evaluated by PT, OT, and speech therapy, felt to  benefit from outpatient therapies.  We will set him up with outpatient  therapy at discharge.  The patient has been advised to stop smoking and  follow up with his cardiologist, regular medical physician, and Dr.  Pearlean Brownie.   CONDITION ON DISCHARGE:  The patient is alert and oriented to person and  place.  He has right hemianopsia.  He has nonfluent speech.  He does  follow commands.  His face is symmetric.  He moves all 4 extremities  well.  He has no sensory losses.  Gait is slightly unsteady.  Heart rate  is irregular.  His breath sounds are clear   DISCHARGE PLAN:  1. Discharge home with family.  2. No aspirin, Coumadin, Motrin, or containing products at time of      discharge.  He will not ever be able to take Coumadin again.  Plans      to resume aspirin in 1 month if remains stable.  3. Follow up primary care physician within 1 month for risk factor      control.  4. [facility] 2007, at 9:40 a.m.   5. Follow  up with Dr. Pearlean Brownie in 2-3 months.  6. Stop smoking.  7. Outpatient PT, OT, and speech therapy.  8. The patient has been advised not to drive or operate any other      large machinery.      Annie Main, N.P.    ______________________________  Sunny Schlein. Pearlean Brownie, MD    SB/MEDQ  D:  11/15/2007  T:  11/16/2007  Job:  [id number]   cc:   Pramod P. Pearlean Brownie, MD  Luis Abed, MD, [facility]  Duke Salvia, MD, [facility]  Tinnie Gens A. Tawanna Cooler, MD

## 2010-11-23 NOTE — Consult Note (Signed)
NAME:  GURINDER, TORAL NO.:  [id number]   MEDICAL RECORD NO.:  [id number]          PATIENT TYPE:  INP   LOCATION:  2003                         FACILITY:  [facility]   PHYSICIAN:  Charlynne Pander, D.D.S.DATE OF BIRTH:  Aug 09, 1945   DATE OF CONSULTATION:  06/16/2008  DATE OF DISCHARGE:                                 CONSULTATION   [name] is a 65 year old male referred by Dr. Myrtis Ser for dental  consultation.  The patient with recent identification of Streptococcus  viridans bacteremia.  Dental consultation requested to rule out dental  etiology as potential source of this infection.   PAST MEDICAL HISTORY:  1. Streptococcal viridans bacteremia.      a.     Recent TEE, which revealed anterior mitral leaflet being       flail secondary to ruptured chordae, thickened leaflet with       apparent vegetation, and tricuspid valve regurgitation.      b.     Current IV antibiotic therapy under infectious disease       protocol.  2. Ischemic cardiomyopathy.  3. Coronary disease status post acute myocardial infarction in October      2009.  4. Heart failure.  5. Atrial fibrillation.  6. History of intracerebral hemorrhage secondary to Coumadin therapy.  7. COPD.  8. Diabetes mellitus - type 2.  9. Hypertension.  10.History of patent foramen ovale.  11.Severe mitral regurgitation and moderate tricuspid valve      regurgitation with possible mitral valve/tricuspid valve      replacement in the future.   ALLERGIES/ADVERSE DRUG REACTION:  COUMADIN caused hemorrhagic stroke.   MEDICATIONS:  1. Aspirin 81 mg daily.  2. Coreg 3.125 mg twice daily.  3. Rocephin 2 g IV every 24 hours.  4. Pepcid 20 mg twice daily.  5. Lasix 20 mg daily.  6. Potassium chloride 10 mEq daily.  7. Rifampin 300 mg twice daily.   SOCIAL HISTORY:  The patient is married, lives with his wife.  The  patient is a retired Paramedic.  The patient with previous history  of smoking, but  quit in October 2009.  The patient denies current use of  alcohol.   FAMILY HISTORY:  Noncontributory.   FUNCTIONAL ASSESSMENT:  The patient remains independent with basic ADLs  at this time.   REVIEW OF SYSTEMS:  This is reviewed from the health history assessment  form for this admission.   DENTAL HISTORY:   CHIEF COMPLAINT:  Dental consultation requested to rule out dental  etiology for Streptococcal viridans bacteremia.   HISTORY OF PRESENT ILLNESS:  The patient recently identified to have a  streptococcal viridans bacteremia/infective endocarditis.  The patient  with recent TEE, which revealed probable mitral valve vegetation on the  anterior leaflet along with a flail leaflet.  The patient also with  tricuspid valve regurgitation.  Dental consultation requested to rule  out the etiology for the streptococcal viridans infection.   The patient currently denies acute toothache, swellings, or abscesses.  The patient was last seen by dentist anywhere from 6-10 years  ago.  The  patient does not seek regular dental care at this time.  The patient  denies the presence of partial dentures.  The patient indicates that he  last had a root canal therapy anywhere from 10-15 years ago.  The  patient indicates that he has a broken filling on the lower left molar,  which does not hurt him and is not sensitive to cold, hot, or any others  stimulus.  The patient denies toothache symptoms with this tooth.   PHYSICAL EXAMINATION:  GENERAL:  The patient is a well-developed and  well-nourished male in no acute distress.  VITAL SIGNS:  Blood pressure is 93/58, pulse rate is 73, temperature is  97.2, respirations are 20.  HEAD AND NECK:  There is no palpable lymphadenopathy.  The patient  denies acute TMJ symptoms.   INTRAORAL EXAM:  The patient with normal saliva.  There is no evidence  of abscess formation within the mouth.  There is no evidence of any  persistent symptoms associated with  tooth #30 at this time.   DENTITION:  The patient has missing teeth, #2, #3, #4, #5, #14, #15,  #17.   PERIODONTAL:  The patient with chronic periodontitis with minimal plaque  and calculus accumulations, selective areas of gingival recession, and  incipient to moderate bone loss.  There is no obvious tooth mobility  noted at this time.   DENTAL CARIES:  I do not see any significant dental caries, but would  need a full series of dental radiographs to rule out incipient dental  caries.  Tooth #19 does have a fractured lingual cusp and could be best  served by fabrication of a crown.   ENDODONTIC:  The patient currently denies acute pulpitis symptoms.  The  patient has a previous root canal therapy associated with tooth #30.  The patient denies acute symptoms at this time. Panoramic Xray indicates  a possible increase in the periodontal ligament space of tooth #30 taht  may be chronic in nature.  Ideally, he would benefit from an evaluation  by an endodontist for possible retreatment of the root canal therapy.  Tooth #19 with a fractured restoration, does not have any apparent  periapical pathology.   CROWN OR BRIDGE:  The patient has a crown restoration on tooth #30,  which appears to be acceptable.  The patient could benefit from  evaluation for other crowns including tooth #19.   PROSTHODONTIC:  The patient denies presence of partial dentures.   OCCLUSION:  The patient with a poor occlusal scheme secondary to  multiple missing teeth, supereruption and drifting of the unopposed  teeth into the edentulous areas, and lack of replacement of the missing  teeth with dental prosthesis.   RADIOGRAPHIC INTERPRETATION:  A panoramic x-ray was obtained on June 16, 2008.   There are multiple missing teeth.  There is incipient to moderate bone  loss.  There are no obvious periapical radiolucencies.  There is a  slight increase in the periodontal ligament space associated with the   mesial roots of tooth #30.  Ideally, this could be reevaluated by an  endodontist for retreatment as indicated.  There is a fractured  restoration associated tooth #19..   ASSESSMENT:  1. Chronic periodontitis with incipient bone loss.  2. Minimal plaque and calculus accumulations.  3. Selective areas of gingival recession.  4. No obvious tooth mobility.  5. Multiple missing teeth.  6. Supereruption and drifting of the unopposed teeth into the  edentulous areas.  7. No history of partial dentures.  8. Poor occlusal scheme.  9. Fractured lingual cusps on tooth #19, which ideally could be      restored with a crown.  10.Previous root canal therapy on tooth #30 with no obvious persistent      periapical pathology or symptoms.  There is an increased      periodontal ligament space, which could be reevaluated by an      endodontist for possible retreatment of tooth #30 as indicated.  11.The need for antibiotic premedication prior to invasive dental      procedures per American Heart Association guidelines.   PLAN/RECOMMENDATIONS:  1. I have discussed risks, benefits, complications, and various      treatment options with the patient in relation to his medical and      dental conditions. We also discussed selective extractions with      alveoloplasty, periodontal therapy, dental restorations, root canal      therapy, retreatment of root canal therapy #30, crown restorations,      partial denture fabrication, possible implant therapy, and no      treatment.  The patient currently would determine what he wishes to      proceed with after further discussion with Cardiology team.  The      patient is not interested in extraction of teeth at this time, but      could benefit from initial periodontal therapy prior to anticipated      heart valve surgery if this occurs.  The patient knows that he      should follow up with a general dentist of his choice for crown on      tooth #19 as well  as reevaluation tooth #30 for retreatment of the      root canal therapy.  2. Discussion of findings with cardiology and thoracic surgery team as      indicated.  The patient appears to have no real reason for dental      etiology for Strep viridans bacteremia other then activities of      daily living.  The patient understands that he needs to keep his      mouth in the utmost condition to prevent future episodes of      bacteremia and infective endocarditis.  Therefore, a follow up with      general dentist for routine periodontal therapy is indicated.  I      will discuss this further with Cardiology team as indicated      concerning provision of care either in the hospital operating room      as indicated or with general dentist of his choice once medically      stable.      Charlynne Pander, D.D.S.  Electronically Signed     RFK/MEDQ  D:  06/16/2008  T:  06/17/2008  Job:  [id number]   cc:   Luis Abed, MD, [facility]  Lacretia Leigh. Ninetta Lights, M.D.

## 2010-11-23 NOTE — Discharge Summary (Signed)
NAME:  [name], [name] NO.:  [id number]   MEDICAL RECORD NO.:  [id number]           PATIENT TYPE:   LOCATION:                                 FACILITY:   PHYSICIAN:  Duke Salvia, MD, FACCDATE OF BIRTH:  [birth year]   DATE OF ADMISSION:  06/10/2008  DATE OF DISCHARGE:  06/20/2008                               DISCHARGE SUMMARY   ADDENDUM:  The patient is stable at this time; blood pressure 93/60,  heart rate 75, afebrile.  Dr. Berton Mount has assessed the patient and  has spoken with Dr. Johny Sax.  Question is do we want to do  surgery on or off antibiotics.  Dr. Ninetta Lights and Dr. Graciela Husbands discussed  options.  Dr. Ninetta Lights states he will follow up with Dr. Cornelius Moras in regards  to timing of the patient's surgery whether it be on or off the  antibiotics.  Dr. Graciela Husbands states that he will also follow up with Dr. Myrtis Ser  regarding further arrangements.      Dorian Pod, ACNP      Duke Salvia, MD, [facility]  Electronically Signed    MB/MEDQ  D:  06/20/2008  T:  06/20/2008  Job:  [id number]   cc:   Luis Abed, MD, [facility]  Salvatore Decent. Cornelius Moras, M.D.  Lacretia Leigh. Ninetta Lights, M.D.  Charlynne Pander, D.D.S.  Pramod P. Pearlean Brownie, MD

## 2010-11-23 NOTE — Assessment & Plan Note (Signed)
[facility]                            CARDIOLOGY OFFICE NOTE   [name], [name]                        MRN:          [id number]  DATE:06/10/2008                            DOB:          28-Dec-1945    [name] was seen post very recent hospitalization.  I had seen him in  the office on May 29, 2008.  An ACE inhibitor was started.  He was  then admitted to [facility] on June 08, 2008.  He had a syncopal  episode.  There was question that it could be vasovagal.  It was also  noted that he had some white count elevation and blood cultures were  drawn.  He was volume overloaded.  He was discharged home, but his blood  cultures have come back 4/4 positive for gram-positive cocci in pairs  and chains.  Therefore, he is admitted to the hospital from the office  today.  See the hospital based complete history and physical exam.     Charles Abed, MD, [facility]  Electronically Signed    JDK/MedQ  DD: 06/10/2008  DT: 06/11/2008  Job #: [id number]

## 2010-11-23 NOTE — Consult Note (Signed)
NAME:  [name], [name] NO.:  [id number]   MEDICAL RECORD NO.:  [id number]          PATIENT TYPE:  INP   LOCATION:  2003                         FACILITY:  [facility]   PHYSICIAN:  Levert Feinstein, MD          DATE OF BIRTH:  [birth year]   DATE OF CONSULTATION:  DATE OF DISCHARGE:                                 CONSULTATION   REASON FOR CONSULTATION:  Is patient a candidate for antiplatelet agent  with aspirin or platelet, is MRI of the brain necessary to guide the  decision.   HISTORY OF PRESENT ILLNESS:  The patient is a 65 year old left-handed  Caucasian gentleman, with complicated past medical history, including  ischemic cardiomyopathy, coronary artery disease, congestive heart  failure, permanent atrial fibrillation, diabetes, COPD, recent AV node  ablation with biventricular ICD implantation, was admitted for  bacteremia, possible valvular endocarditis.   He has a long complicated hospital course, initially admitted in Nov 10, 2007, to Neurology Service for left temporoparietal area large size  intracranial hemorrhage (3.7 x 5 x 5 cm).  He was on long-term Coumadin  treatment because of atrial fibrillation with INR 2.3.  He apparently  recovered uneventfully, but all the anticoagulation was therefore  discontinued.   He subsequently suffered acute myocardial infarction in October 2009  with peak troponin of 31.  No anticoagulation or antiplatelet agent was  given at that time due to previous history of intracranial bleeding.  He  has significant decline in his cardiac function with ejection fraction  dropped from 55% to 25% to 30%.  He underwent implant of [employer] RF biventricular ICD on May 06, 2008, also AV node  ablation due to rapid ventricular rate, poorly controlled atrial  fibrillation.  He did well until May 07, 2008, has developed  syncope, orthostatic hypotension, later blood culture found viridian  streptococcus, most recent  positive being June 10, 2008.  He has been  on antibiotic treatment.   Cardiologist, Dr. Myrtis Ser is considering further cardiac evaluation or  intervention including cardiac cath, potential bypass, or mitral valve  replacement.  Neurology is consulted concerning long-term  anticoagulation versus antiplatelet agent, and also question of, if MRI  of the brain absolutely needed for further management and decision  making in his case.   REVIEW OF SYSTEMS:  The patient actually stated he is feeling well.  He  denies headache, fever, vision change, lateralized motor sensory  deficit, or chest pain.   PAST MEDICAL HISTORY:  1. Coronary artery disease.  2. Ischemic cardiomyopathy, ejection fraction of 25%-30%.  3. Congestive heart failure.  4. Permanent atrial fibrillation status post dual-chamber ICD on      May 06, 2008, status post AV node ablation on May 06, 2008,      and bacteremia with possible valvular endocarditis, previous left      temporoparietal intracranial hemorrhage with Coumadin, INR 2.3.  5. COPD.  6. Diabetes.  7. Hypertension.  8. History of patent foramen ovale.  9. Remote spontaneous pneumothorax.   ALLERGIES:  No known drug  allergies.   SOCIAL HISTORY:  He lives with his spouse in [name]  He is retired  from post office and quit smoking in October 2009 and denied drinking.   FAMILY HISTORY:  Noncontributory.   CURRENT MEDICATIONS:  Coreg, Rocephin, Pepcid, fentanyl p.r.n., Lasix  and Versedp.r.n., Crestor, Xanax, and Zofran.   PHYSICAL EXAMINATION:  VITAL SIGNS:  T-max is 99.1, pulse is 80, and  blood pressure was 90-102/53-52, heart rate of 80, and saturation 96%.  CARDIAC:  Regular with occasional PVCs.  PULMONARY:  Clear to auscultation bilaterally.  NECK:  Supple.  No carotid bruits.  NEUROLOGIC:  He is awake, alert, oriented to [name], June 12, 2008, and Thursday.  Cranial nerves II through XII.  HEENT:  Pupils equal, round, and  reactive to light.  Right fundi was  sharp.  Extraocular movements were full.  Facial sensation and strength  was normal.  Visual fields were full on confrontational test.  Uvula,  tongue midline.  Head turning, shoulder shrugging were normal and  symmetric.  Tongue protrusion and cheek strength was normal.  MOTOR:  Normal tone, bulk, and strength.  Sensory, normal to light  touch, pinprick, vibratory sense and sensation.  Deep tendon reflexes,  brisk in the upper extremity, patella, trace on the Achilles.  Plantar  responses were flexor.  Coordination, normal finger-to-nose and heel-to-  shin.  Gait, narrow based and steady.   LABORATORY DATA:  CT of the brain without contrast reviewed from Nov 10, 2007, Nov 11, 2007, Nov 13, 2007, January 16, 2008, most recent being April 30, 2008, and June 07, 2008, which has revealed gradual evolution  and maturation of the left temporoparietal lobe hemorrhagic bleeding,  the most recent June 07, 2008, had demonstrated encephalomalacia but  no acute change.   ASSESSMENT AND PLAN:  A 65 year old gentleman with complicated medical  history detailed above and previous hemorrhagic stroke, now with  possible valvular endocarditis, coronary artery disease, potential  candidate for cardiac cath, bypass surgery, and valve replacement.  1. The patient presently has multiple ischemic vascular risk factor.      He would benefit antiplatelet agents.  With stable neurological      status and repeat CAT scan, should be okay to proceed with      antiplatelet agents, but I do not think he is a good candidate for      anticoagulation treatment.  2. The patient is certainly prone to develop embolic stroke with      possible valvular endocarditis, especially with septic emboli prone      to develop bleeding complications; however, based on current stable      neurological examination and imaging studies, I think the benefit      of antiplatelet agent will  outweigh risk.  I would caution against      anticoagulation treatment.  3. We can follow the patient by clinical history and examination.  The      patient is not a candidate for MRI due to pacemaker placement, and      I do not think it is absolutely necessary.  If patient develops      neurological      complaints such as headache, vision change, focal deficit, we can      follow up with examination and the CT scan of the brain.   The patient will also be seen by our stroke specialist Dr. Pearlean Brownie in the  a.m.  Levert Feinstein, MD  Electronically Signed     YY/MEDQ  D:  06/12/2008  T:  06/13/2008  Job:  [id number]

## 2010-11-23 NOTE — H&P (Signed)
NAME:  [name], [name] NO.:  [id number]   MEDICAL RECORD NO.:  [id number]          PATIENT TYPE:  EMS   LOCATION:  ED                           FACILITY:  [facility]   PHYSICIAN:  Gustavus Messing. Orlin Hilding, M.D.DATE OF BIRTH:  1945-09-09   DATE OF ADMISSION:  11/10/2007  DATE OF DISCHARGE:                              HISTORY & PHYSICAL   CHIEF COMPLAINT:  Headache and confusion.   HISTORY OF PRESENT ILLNESS:  [name] is a 65 year old left-handed  white male with a history of atrial fibrillation, who was on Coumadin  which is therapeutic with an INR of 2.3.  According to his wife, on  Thursday night he complained of posterior headache and of the next few  days seemed to become increasingly confused with some staggering.  She  was brought into the emergency room today where CT revealed a large  intracranial hemorrhage in the left occipitoparietal region.  The  patient is somewhat aphasic and confused, not really able to give very  good history.  He did not have any falls according to the wife as far as  she knows.   REVIEW OF SYSTEMS:  She says it is negative except for the headache and  some environmental allergies.  Detailed review was elicited with 13  systems investigated.   PAST MEDICAL HISTORY:  Significant for:  1. Atrial fibrillation, on Coumadin.  2. Syncope.  3. CHF.  4. Type 2 diabetes, diet controlled.  5. COPD.  6. History of remote spontaneous pneumothorax.   MEDICATIONS:  Include:  1. Coumadin in varying doses.  2. Atenolol 100 mg b.i.d.  3. Nizatidine 150 mg b.i.d.  4. Potassium 20 mEq daily.  5. Lanoxin 250 mcg daily.  6. Cartia XT 240 mg daily.  7. Furosemide 40 mg daily.   ALLERGIES:  NO KNOWN DRUG ALLERGIES.   SOCIAL HISTORY:  He is married and just recently retired from the [employer] just a few months ago, has one child.  Smokes a pack and  half of cigarettes a day.  No alcohol or illicit drug use.   FAMILY HISTORY:   Positive for stroke in the mother and cancer in the  father.   OBJECTIVE:  On exam:  Temperature is 9.1, BP 103/71, heart rate 102, respirations 16, and 95%  saturation.  Head is normocephalic and atraumatic.  NECK: Supple without bruits.  HEART: Irregularly irregular with somewhat rapid response in the low  100s.  LUNGS: Clear to auscultation.  ABDOMEN: Soft and nontender.  SKIN: Dry.  Mucosa is moist.  NEUROLOGIC Exam: On the NIH stroke scale, he scored 7.  He was drowsy,  answered one question out of two correctly, but did follow 2/2 commands  correctly.  He had a normal gaze and a partial hemianopsia to the right  only on double simultaneous stimulation.  Minor right facial weakness.  No drift in either upper extremity or lower extremity.  Mild apraxia  with finger-to-nose rather than ataxia.  He does have trouble  understanding what I wanted him to do and performing it.  Sensory was  negative for any clear-cut asymmetry.  He had a mild-to-moderate  aphasia, mild dysarthria, and mild neglect with right visual field.  Deep tendon reflexes were diminished.  Toes are downgoing.   CT of the brain showed the hemorrhage in the left parietooccipital  region measuring about 3.5 x 5-cm temporoparietal or occipitoparietal  location, little bit of subarachnoid hemorrhage in the left parietal  region in the same area.  No mass effect as noted, however, is otherwise  unremarkable.   Sodium is 138, potassium 4.4, chloride 101, CO2 of 28, BUN is 10,  creatinine 0.78, glucose 171, and calcium 8.9.  White blood cell count  is 11.9, hemoglobin 17.2, hematocrit 50, and platelets 168,000.  PT 25.7  and INR 2.3.   ASSESSMENT:  Left parietooccipital hemorrhage, question whether this was  spontaneous.  It could be secondary conversion of an embolic ischemic  stroke, but the initial symptom was headache, and the shape of the bleed  is rounded and lobar, as opposed to wedge shaped within a  specific  vascular territory. There is no history of trauma.  No hypertension.  He  is on anticoagulation, however, for atrial fibrillation.  He has  confusion, aphasia, an extinguishing visual field cut, and headache.  1. Atrial fibrillation with ventricular rate in the low 100s, on      Lanoxin, Cartia, and atenolol.   PLAN:  Admit him to the Neuro ICU at [facility] for monitoring.  We will need  to reverse his anticoagulation with fresh frozen plasma and vitamin K.  We will recheck PT and INR in about 6 hours, recheck his CT of the brain  in the morning, and check MRI scan of the brain.  He will need  therapies, speech particularly, as well as occupational and physical  therapy.  He is not a TPA candidate secondary to hemorrhage.  He is not  an Accelerate candidate secondary to the time elapsed and the absence of  significantly elevated blood pressure.  He will be admitted to the  Stroke Service.      Catherine A. Orlin Hilding, M.D.  Electronically Signed     CAW/MEDQ  D:  11/10/2007  T:  11/10/2007  Job:  [id number]

## 2010-11-23 NOTE — Cardiovascular Report (Signed)
NAME:  [name], [name] NO.:  [id number]   MEDICAL RECORD NO.:  [id number]          PATIENT TYPE:  INP   LOCATION:  2003                         FACILITY:  [facility]   PHYSICIAN:  Arturo Morton. Riley Kill, MD, FACCDATE OF BIRTH:  11-Jun-1946   DATE OF PROCEDURE:  DATE OF DISCHARGE:                            CARDIAC CATHETERIZATION   INDICATIONS:  The patient is a 65 year old gentleman with a complex  recent history.  Please refer to the chart for complete evaluation.  Possible mitral valve surgery has been planned.  He has also had a prior  infarct.  As a result, coronary arteriography was felt to be necessary.  He underwent implantation of a Medtronic BiV defibrillator in October of  this last year due to rapid ventricular rate, poor controlled atrial  fibrillation.  He did well until May 07, 2008, where he developed  syncope, orthostatic hypotension, and was found to have viridans  streptococci.  The current study was done to assess his coronary  anatomy, right heart pressures, and distal aortogram.   PROCEDURES:  1. Right heart catheterization.  2. Selective coronary arteriography.  3. Distal aortography.   DESCRIPTION OF PROCEDURE:  The patient was brought to the Cath Lab and  prepped and draped in usual fashion.  We used the left groin.  Through  an anterior puncture, the femoral artery was entered and a 5-French  sheath was placed.  Following this, views of the left and right coronary  arteries were obtained in multiple angiographic projections.  Following  this, distal aortography was performed.  Right heart pressures were then  obtained.  Following this, all catheters were subsequently removed and  he was taken to the holding area in satisfactory clinical condition.   HEMODYNAMIC DATA:  1. Central aortic pressure 94/54, mean 70.  2. RA pressure 6.  3. RV pressure 50/6.  4. Pulmonary artery pressure 49/17, mean 32.  5. Pulmonary capillary wedge 19.  6. Fick  cardiac output 6.9 L per minute.  7. Fick cardiac index 3.48 L per minute per m2.  8. Thermodilution cardiac output 5.7 L per minute.  9. Fick cardiac index 2.8.  Thermodilution cardiac index 2.8 L per      minute per m2.  10.Aortic saturation 90%.  11.Pulmonary artery saturation 66%.   ANGIOGRAPHIC DATA:  1. The left main coronary artery is a long-caliber vessel and appears      to be free of critical disease.  2. The circumflex demonstrates some luminal irregularities proximally,      but divides distally in 2 moderate size marginal branches and      appears free of critical disease.  3. The left anterior descending artery courses to the apex as a major      diagonal branch.  There are some smooth luminal irregularities, but      no critical stenosis.  There is probably tapered narrowing of about      30% of the distal vessel.  4. The right coronary artery is a very large-caliber vessel.  No      obstruction is noted.  There is a posterior descending and      posterolateral system both of which appear to be free of critical      disease.   Distal aortography reveals what appear to be patent renal arteries with  perhaps 30-40% right renal artery ostial narrowing.  The aorta distal to  this is relatively smooth, but there is a fair amount of calcification,  it bifurcates distally.  There is mild plaque in both iliacs, but no  critical stenosis noted.  The left renal artery appears to be widely  patent.   CONCLUSIONS:  1. Pulmonary hypertension with what appears to be mitral regurgitation      with elevated V-waves.  2. No critical coronary artery stenosis noted.  3. A 30-40% right renal ostial narrowing without significant      obstruction.   The etiology of this infarct may well have been thromboembolic as he was  not on anticoagulation.  The patient currently has endocarditis.  Further evaluation will be with Dr. Myrtis Ser and Dr. Barry Dienes.      Arturo Morton. Riley Kill, MD, [facility]   Electronically Signed     TDS/MEDQ  D:  06/17/2008  T:  06/18/2008  Job:  [id number]   cc:   Luis Abed, MD, [facility]  CV Laboratory  Duke Salvia, MD, [facility]  Salvatore Decent. Cornelius Moras, M.D.

## 2010-11-23 NOTE — Op Note (Signed)
NAME:  [name], [name] NO.:  [id number]   MEDICAL RECORD NO.:  [id number]          PATIENT TYPE:  INP   LOCATION:  2302                         FACILITY:  [facility]   PHYSICIAN:  Guadalupe Maple, M.D.  DATE OF BIRTH:  [birth year]   DATE OF PROCEDURE:  07/17/2008  DATE OF DISCHARGE:                               OPERATIVE REPORT   PROCEDURE:  Intraoperative transesophageal echocardiography.   [name] is a 65 year old African American male with a history of  endocarditis and severe mitral regurgitation who was here to undergo  mitral valve repair or replacement by Dr. Cornelius Moras.  Intraoperative  transesophageal echocardiography was requested to evaluate the mitral  valve to determine if any valvular pathology was present to assess left  ventricular and right ventricular function.   The patient was brought to the operating room at [facility].  General anesthesia was induced without difficulty.  The trachea was  intubated without difficulty.  A transesophageal echocardiography probe  was inserted into the esophagus without difficulty.   PREBYPASS FINDINGS:  1. Aortic valve.  The aortic valve was trileaflet and appeared to open      normally with no aortic insufficiency and no calcification of the      leaflets.  2. Mitral valve.  There was a severe mitral regurgitation.  The      anterior leaflet in the area of A2 and A1 was flail.  There was a      severe jet of mitral regurgitation which was directed posteriorly.      There were torn chordae present which had appeared to have been      attached to the anterolateral papillary muscle and anterior      leaflet.  There was no obvious vegetation noted on the mitral      valve.  3. Left ventricle.  The left ventricle was dilated and had mild to      moderate global hypokinesis with an ejection fraction enlisted at      40-45%, a left ventricular end-diastolic diameter measured 75 mm at      the end diastole at  the mid papillary level, left ventricular wall      thickness measured 1.1-1.2 cm at the end diastole at the mid      papillary level.  There was no thrombus noted in the left      ventricular apex and no regional wall motion abnormalities.  4. Right ventricle.  The right ventricle appeared moderately dilated      but there was good contractility to the right ventricular free      wall.  5.  Tricuspid valve.  There was a pacing lead noted crossing      the tricuspid valve.  There was trace to 1+ tricuspid      insufficiency.  5. Interatrial septum.  There was a large patent foramen ovale or      atrial septal defect noted which appeared to create a jet which      wrapped around the wall of the right atrium.  The shunting  appeared      left-to-right.  There was a few bubbles noted in the left atrium      when agitated saline was injected into the right atrium.  6. Left atrium.  The left atrial size was enlarged.  There was no      thrombus noted in the left atrium and left atrial appendage.  7. Ascending aorta.  The ascending aorta with a well-defined      sinotubular ridge and no significant atheromatous disease noted.  8. Descending aorta.  The descending aorta showed no significant      atheromatous disease.   POSTBYPASS FINDINGS:  1. Aortic valve.  The aortic valve was unchanged from the prebypass      study and appeared normal.  2. Mitral valve.  There was a bioprosthetic valve noted in the mitral      position.  The leaflets opened well.  There was no perivalvular      insufficiency.  There was a central jet of mitral insufficiency.      The prosthesis was well seated.  There was no rocking noted.  3. Left ventricle.  The left ventricle again was dilated.  There was      mild-to-moderate global hypokinesis, ejection fraction estimated at      45%.  4. Right ventricle.  The right ventricular size was within normal      limits.  5. Interatrial septum.  There was no residual patent  foramen ovale      noted but appeared to be a pericardial patch noted in the area of      the interatrial septum at the foramen ovale.  No residual blood      flow across the interatrial septum could be appreciated.  6. Left atrium.  The left atrial size again was enlarged.  The left      atrial appendage appeared to be obliterated and there was no      residual left atrial appendage noted.           ______________________________  Guadalupe Maple, M.D.     DCJ/MEDQ  D:  07/17/2008  T:  07/18/2008  Job:  [id number]

## 2010-11-23 NOTE — H&P (Signed)
NAME:  Charles, [name] NO.:  [id number]   MEDICAL RECORD NO.:  [id number]          PATIENT TYPE:  INP   LOCATION:  3702                         FACILITY:  [facility]   PHYSICIAN:  Luis Abed, MD, FACCDATE OF BIRTH:  May 01, 1946   DATE OF ADMISSION:  06/10/2008  DATE OF DISCHARGE:                              HISTORY & PHYSICAL   [name] is admitted to the office today with history of bacteremia.  He  was recently in the hospital, and in fact went home yesterday.  Blood  cultures drawn on June 08, 2008 are reported back this morning as  showing gram-positive cocci in pairs and chains, and this is present in  4/4 bottles.  On this basis he needs to be admitted to the hospital.  More blood cultures need to be done.  He will be started on antibiotics.  Will obtain ID consultation and consultation from the EP team since he  has a recent IV ICD in place.   [name] also has significant other medical problems.  Recently he had  had an inferolateral MI on April 25, 2008.  He was treated in the  hospital.  He had to be treated medically because he has evidence of a  CNS bleed in the past, and he cannot receive aspirin and Plavix.  Then  because his atrial fib rate could not be controlled, he had an AV nodal  ablation and the placement of a biventricular [employer] ICD.   ALLERGIES:  No known definite drug allergies.   MEDICATIONS:  1. Nizatidine 150 mg b.i.d.  2. Furosemide 20.  3. Potassium 10 mEq daily.  4. Carvedilol 3.125 b.i.d.  5. Lipitor 80 mg daily.   OTHER MEDICAL PROBLEMS:  See the extensive problem list below.   SOCIAL HISTORY:  See the prior documented social histories in the  records.   FAMILY HISTORY:  See the prior family history as recorded.   REVIEW OF SYSTEMS:  Today he is actually feeling relatively well.  He is  having no chest pain.  He has no shortness of breath.  He is oriented  fully.  He is not having any fevers or chills at this  point.  Otherwise,  his review of systems is negative.   PHYSICAL EXAMINATION:  Blood pressure is 89/59 with a pulse of 80.  The patient is oriented to person, time, and place.  Affect is normal.  He is here with his wife today.  HEENT:  Reveals no xanthelasma.  He has normal extraocular motion.  There are no carotid bruits.  There is no jugular venous distention.  LUNGS:  Clear.  Respiratory effort is not labored.  CARDIAC:  Reveals S1 with an S2.  There is a systolic murmur.  ABDOMEN:  Soft.  He has no significant peripheral edema.  He has no skin lesions.  He has no petechiae.  He has no splinter  hemorrhages in his nail bed.   LABORATORIES:  The labs that I have available as of today relate to the  positive blood cultures that were called in.  He has recent labs in the  hospital.   He actually had a carotid Doppler done today before my seeing him.  On a  preliminary basis he had no significant carotid disease.  There was  possible aneurysmal change in the proximal internal carotid arteries  versus a normal variant.   PROBLEMS:  1. Chronic atrial fibrillation status post atrioventricular nodal      ablation.  2. Hypertension.  At this point he is hypotensive.  3. Status post inferolateral myocardial infarction on April 25, 2008      treated medically because he cannot have anticoagulants due to      central nervous system bleeding in the past.  4. Severe left ventricular dysfunction.  His ejection fraction is      reduced.  I have been trying to adjust his medicines recently by      adding an angiotensin-converting enzyme inhibitor.  He was      hypotensive with this, and he is not on an angiotensin-converting      enzyme inhibitor at this time.  5. History of a left occipital parietal intracranial hemorrhage in the      past.  He had a follow-up CT while in the hospital in October and      also recently in the emergency room showing no change in this area.      He follows  with Dr. Pearlean Brownie.  6. Status post atrioventricular nodal ablation for his atrial      fibrillation in late October 2009.  7. Atrioventricular nodal ablation with placement of a [employer]. Jude      biventricular pacemaker and implantable cardioverter-defibrillator      placed on May 06, 2008.  8. Ongoing relative hypotension.  He cannot tolerate the angiotensin-      converting enzyme inhibitor addition.  9. Significant chronic obstructive pulmonary disease.  10.Hospitalization on June 07, 2008 with syncope.  There was      question that it was vasovagal and related to hypotension from his      angiotensin-converting enzyme inhibitor.  11.Congestive heart failure while hospitalized on June 07, 2008.      He diuresed significantly.  12.The patient is not a Coumadin candidate related to his hemorrhagic      stroke in the past.  13.History of a patent foramen ovale.  14.***Positive blood cultures drawn in the past few days showing 4/4      cultures positive for gram-positive cocci in pairs and chains.  The      organism has not been definitively delineated yet.  The patient      will be hospitalized today.  He does not appear to be clinically      septic.  He will be admitted today.  We will call infectious      disease for consultation, and I will call electrophysiology to see      him as he recently had a device implanted.  He will have another      full set of blood cultures.  Then he will receive a dose of      Rocephin intravenous pending further recommendations from      infectious disease.  He will have a two-dimensional echocardiogram      to see if we can see any obvious vegetations.  Consideration will      be given to a transesophageal echocardiogram.  I explained all of      these issues to the patient and his wife.  We are all, of course,      disappointed with the finding of these positive blood cultures, but      we need to treat this immediately.  He is admitted at this  time.  15.No proven carotid stenoses, but question of aneurysmal changes at      the proximal internal carotid arteries.  This may be a variant.  We      will need to keep this in mind.      Luis Abed, MD, [facility]  Electronically Signed     JDK/MEDQ  D:  06/10/2008  T:  06/10/2008  Job:  [id number]

## 2010-11-23 NOTE — Consult Note (Signed)
NAME:  OMARR, HANN NO.:  [id number]   MEDICAL RECORD NO.:  [id number]          PATIENT TYPE:  INP   LOCATION:  2003                         FACILITY:  [facility]   PHYSICIAN:  Hillis Range, MD       DATE OF BIRTH:  01/20/1946   DATE OF CONSULTATION:  DATE OF DISCHARGE:                                 CONSULTATION   REQUESTING PHYSICIAN:  Luis Abed, MD, FACC   REASON FOR CONSULTATION:  Possible implantable cardioverter-  defibrillator system infection.   HISTORY OF PRESENT ILLNESS:  Mr. Feigel is a pleasant 65 year old  gentleman with multiple comorbidities including ischemic cardiomyopathy,  coronary artery disease, New York Heart Association class II/III heart  failure, permanent atrial fibrillation, diabetes, COPD, and recent AV  nodal ablation with biventricular ICD implantation who now presents for  further evaluation and management of bacteremia.  The patient has had a  long an unfortunate hospital course.  He was initially admitted in May  of this year with an intracranial hemorrhage while taking Coumadin.  All  anticoagulation was therefore discontinued.  He subsequently had an  acute inferolateral ST elevation MI in October with a peak troponin of  31.  Due to inability to anticoagulate the patient, intervention was not  performed.  The patient had significant decline in his ejection fraction  from 55% to between 25 and 30%.  He subsequently underwent implantation  of a [employer]. [employer] Promote RF biventricular ICD by Dr. Sherryl Manges  on May 06, 2008.  Due to rapid ventricular rates and poorly  controlled atrial fibrillation, he subsequent underwent AV nodal  ablation.  The patient did well until May 07, 2008, when he had a  syncopal episode and was readmitted to [facility].  During the  patient's hospital stay, he was diagnosed with orthostatic hypotension,  which was felt to be exacerbated by recently started ACE inhibitors.  During his hospital stay, he apparently did have blood cultures  obtained.  The patient subsequently was found to have positive blood  cultures with presently 2 separate blood cultures from June 07, 2008, growing viridans Streptococcus.  Subsequent blood cultures  obtained on June 10, 2008, remain negative to date.  The patient was  admitted for further evaluation.  He denies chest discomfort,  palpitations, further episodes of presyncope or syncope, nausea,  vomiting, fevers, chills, night sweats, or other symptoms.   PAST MEDICAL HISTORY:  1. Coronary artery disease.  2. Ischemic cardiomyopathy (ejection fraction 25-30%).  3. New York Heart Association class II/III heart failure symptoms.  4. Permanent atrial fibrillation.  5. Status post implantation of a St. Jude Medical Promote RF dual-      chamber ICD biventricular ICD implanted on May 06, 2008.  6. Status post AV nodal ablation, May 06, 2008.  7. Recent hospitalization for orthostatic hypotension.  8. Prior intracranial hemorrhage with Coumadin.  9. COPD.  10.Diabetes mellitus.  11.Hypertension.  12.History of patent foramen ovale.  13.Remote spontaneous pneumothorax.   ALLERGIES:  No known drug allergies.   CURRENT MEDICATIONS:  1. Pepcid  20 mg b.i.d.  2. Lasix 20 mg daily.  3. Potassium chloride 10 mEq daily.  4. Coreg 3.125 mg b.i.d.  5. Crestor 40 mg daily.  6. Ambien 5 mg nightly p.r.n.  7. Ceftriaxone 2 g IV daily.   SOCIAL HISTORY:  The patient lives with his spouse in [place].  He  quit smoking in October.  He denies alcohol or drug use.   FAMILY HISTORY:  Noncontributory.   REVIEW OF SYSTEMS:  All systems were reviewed and negative except as  outlined in the HPI above.   PHYSICAL EXAMINATION:  VITALS:  Blood pressure of 100/56, heart rate 77,  respirations 18, temperature 97.5, T max 99.1, and sats 90% on room air.  GENERAL:  The patient is a thin chronically ill-appearing male in no   acute distress.  He is alert and oriented x3.  HEENT:  Normocephalic and atraumatic.  Sclerae clear.  Conjunctivae  pink.  Oropharynx clear.  NECK:  Supple.  No JVD, lymphadenopathy, or bruits.  LUNGS:  Clear to auscultation bilaterally.  HEART:  Regular rate and rhythm, 2/6 systolic ejection murmur along the  left sternal border, and 3/6 systolic ejection murmur at the apex.  GI:  Soft, nontender, and nondistended.  Positive bowel sounds.  EXTREMITIES:  No clubbing, cyanosis, or edema.  SKIN:  No ecchymoses or lacerations.  MUSCULOSKELETAL:  No deformity or atrophy.  PSYCH:  Euthymic mood, full affect, ICD pocket without evidence of  obvious infection.   TELEMETRY:  AFib with ventricular paced rhythm at 70 beats per minute.   Labs are reviewed.   IMPRESSION:  Mr. Bergum is a pleasant 65 year old gentleman with a history  of coronary artery disease, ischemic cardiomyopathy, New York Heart  Association class II/III heart failure, permanent atrial fibrillation  status post AV nodal ablation, and status post biventricular ICD  implantation who now presents with Strep viridans bacteremia.  The  etiology of the patient's bacteremia is unknown, presently though I am  concerned that he may have endocarditis.  A transthoracic echocardiogram  has been obtained and the results are currently pending.  I think that a  transesophageal echocardiogram may also be helpful to evaluate the right  ventricular defibrillator lead as well as the tricuspid valve.  The  patient is currently being evaluated by Infectious Disease and has been  initiated on ceftriaxone and rifampin.  I think that we will need to  follow the patient closely.  Certainly, it is a little reassuring that  he has an  underlying ventricular escape rate of 40 beats per minute.  Further  decision regarding device extraction will be made based on the results  of the patient's transthoracic echocardiogram and transesophageal   echocardiogram, though I suspect that the device extraction for presumed  ICD system infection will be warranted.      Hillis Range, MD  Electronically Signed     JA/MEDQ  D:  06/11/2008  T:  06/12/2008  Job:  [id number]

## 2010-11-23 NOTE — Consult Note (Signed)
[name], [name] NO.:  [id number]   MEDICAL RECORD NO.:  [id number]          PATIENT TYPE:  INP   LOCATION:  2041                         FACILITY:  [facility]   PHYSICIAN:  Coralyn Helling, MD        DATE OF BIRTH:  1946/02/05   DATE OF CONSULTATION:  07/15/2008  DATE OF DISCHARGE:                                 CONSULTATION   REFERRING PHYSICIAN:  Doylene Canning. Ladona Ridgel, MD   REASON FOR CONSULTATION:  Preoperative pulmonary evaluation.   HISTORY OF PRESENT ILLNESS:  [name] is a 65 year old male who was  admitted on January 4, for further treatment of his mitral valve disease  secondary to endocarditis with Strep viridans as well as removal of his  pacemaker and repair of his PFO.  He sustained a myocardial infarction  in September 2009, and had a pacemaker inserted around that time.  He  was treated in December for Strep viridans bacteremia.  He has a history  of a significant tobacco abuse, and he smoked about 2 packets cigarettes  a day, up until the time of his myocardial infarction.  He has  occasional cough with a clear sputum.  He does get dyspnea on exertion  and has occasional wheeze.  He denies any symptoms of allergies or sinus  congestion.  He has been currently having any chest pain or abdominal  pain.  He denies any fevers, chills, or sweats.  He has not had any  significant worsening of his leg swelling.   PAST MEDICAL HISTORY:  1. ST elevation MI in October 2009.  2. Atrial fibrillation status post ablation with a BiV-ICD implant.  3. Diabetes.  4. Viridans Streptococcus bacteremia.  5. Mitral regurgitation.  6. Intracerebral bleed secondary to anticoagulation.   ALLERGIES:  COUMADIN.   CURRENT MEDICATIONS:  1. Aspirin 81 mg daily.  2. Coreg 3.125 mg b.i.d.  3. K-Dur 20 mEq daily.  4. Lasix 40 mg b.i.d.  5. Sliding scale insulin.  6. Pepcid 20 mg b.i.d.  7. Rifadin 300 mg b.i.d.  8. Rocephin 2 g daily.  9. Xopenex 0.63 mg nebulize  q.i.d.   SOCIAL HISTORY:  He smoked from the age of [age], and he smoked 2-pack  cigarettes a day.  He is a retired Paramedic.   FAMILY HISTORY:  Nonsignificant.   PHYSICAL EXAMINATION:  GENERAL:  He is seen in his hospital room,  sitting in a hospital chair.  He is awake, alert, does not appear to be  in any acute distress.  VITAL SIGNS:  Blood pressure 96/59, heart rate 69, respiratory rate 18,  temperature 98.6, oxygen saturation 96% on 4 L.  HEENT:  Pupils reactive.  Extraocular muscles intact.  There is no sinus  tenderness.  No oral lesions.  NECK:  No lymphadenopathy.  No jugular venous distention.  No  thyromegaly.  HEART:  S1 and S2, irregular with a 2/6 systolic murmur.  He has a  dressing over his chest.  CHEST:  He has coarse breath sounds bilaterally with a prolonged  expiratory phase but no wheezing.  ABDOMEN:  Soft, nontender, positive bowel sounds.  EXTREMITIES:  There is minimal ankle edema.  NEUROLOGIC:  He has normal strength.   LABORATORY DATA:  Arterial blood gas showed a pH of 7.42, pCO2 of 54,  pO2 of 44.5.  Hemoglobin 12.5, hematocrit 37.5, WBC is 5.3, platelet  count is 163.  Sodium is 134, potassium is 3.8, chloride is 92, CO2 is  35, BUN is 20, creatinine is 0.7, glucose is 107.  PT is 16.5, INR is  1.3, PTT is 40, and calcium is 8.1.  Chest x-ray shows only vascular  congestion with cardiomegaly.   ASSESSMENT:  1. Chronic obstructive pulmonary disease with history of tobacco      abuse.  Based on his symptoms, clinical findings, and radiographic      findings, I do believe that he does have underlying chronic      obstructive pulmonary disease with emphysema.  I do not have any      pulmonary function test for my review at this time.  I do not think      he would need to have pulmonary function testing done      preoperatively, although ideally it would be nice to have that done      at some point.  What I will do is try to optimize his inhaler       regimen.  I will start him on Spiriva 1 puff daily and change him      over to Xopenex nebulizer q.i.d.  I will also add a flutter valve      to help with expectoration.  2. Hypoxemia.  This is likely on a basis of his underlying chronic      obstructive disease, but also probably to some degree related to      his patent foramen ovale as well as heart failure related to his      mitral regurgitation.  I will continue him on supplemental oxygen.      I then reassess him after his surgical intervention for his cardiac      disease to determine what his oxygen requirements will be at that      time.  3. Mitral regurgitation.  I had a detailed discussion with [name]      regarding the perioperative pulmonary risks with related to valve      repair.  Given his overall clinical status, I think that he would      be at high risk for perioperative pulmonary complications including      possibility of pneumonia and prolonged stay in the ventilator.      However, I do not think that this would preclude him from having      surgery and certainly given the severity of his cardiac disease, I      think it would be in his best interest to pursue surgical      intervention.  4. Viridans Streptococcus endocarditis.  He is to continue on his      current antibiotic regimen.      Coralyn Helling, MD  Electronically Signed     VS/MEDQ  D:  07/15/2008  T:  07/16/2008  Job:  [id number]

## 2010-11-23 NOTE — Op Note (Signed)
NAME:  [name], [name] NO.:  [id number]   MEDICAL RECORD NO.:  [id number]          PATIENT TYPE:  INP   LOCATION:                               FACILITY:  [facility]   PHYSICIAN:  Duke Salvia, MD, FACCDATE OF BIRTH:  08-05-1945   DATE OF PROCEDURE:  05/06/2008  DATE OF DISCHARGE:                               OPERATIVE REPORT   PREOPERATIVE DIAGNOSIS:  Uncontrolled atrial fibrillation with an  ischemic cardiomyopathy.   POSTOPERATIVE DIAGNOSIS:  Uncontrolled atrial fibrillation with an  ischemic cardiomyopathy.   PROCEDURE:  Cardiac resynchronization therapy defibrillator  implantation, anticipation of atrioventricular junction ablation;  contrast venogram.   Following obtaining informed consent, the patient was brought to the  Electrophysiology Laboratory and placed on the fluoroscopic table in  supine position.  After routine prep and drape of the left upper chest,  lidocaine was infiltrated into prepectoral subclavicular region.  An  incision was made and carried down to the layer of the prepectoral  fascia.  Using electrocautery and sharp dissection, a pocket was formed  similarly.  Hemostasis was obtained.   Thereafter, attention was turned to gain access to the extrathoracic  left subclavian vein, which was accomplished with moderate difficulty  indeed requiring a venogram to demonstrate rather cephalad course of the  vein.  This then allowed for 2 separate venipunctures.  Guidewires were  placed and retained.  Then sequentially, an 8-French and 9.5 French  sheath were placed, which were passed through a St. Jude Durata 7122  single-coil defibrillator lead, serial number [id number], which was passed  under fluoroscopic guidance at the right ventricular apex.  Multiple  sites were mapped and then indeed at the procedure, we had to map again.  Because of the amplitudes, we finally found a 12 mV, which fell to about  5 mV.  This was true notwithstanding  inferior to the brisk current of  injury.  In the final location, after multiple sites of mapping, the RV  amplitude was 12 with a pace impedance of 521, threshold 1.1 volts at  0.5 milliseconds.  Current threshold is 2.2 MA.  The current injury was  brisk and there was no diaphragmatic pacing at 10 volts.  This lead was  secured and as suggested above was resecured at the end of the procedure  in this final location.  At this point, coronary artery sinus access was  obtained relatively easily with an MB2 catheter and a Wholey wire.  The  patient turned out to have a huge coronary sinus.  There was a low  lateral vein and a high lateral stub.  We initially targeted the high  lateral stub.  We were able to cannulate with wire, but it only went  about 0.5 cm before it petered out.  We then went back down to the low  lateral vein and using a variety of techniques, we were able to  cannulate it initially with an attained select sheath.  We passed a wire  to a distal ramification, however, the angles were so tight that the  attempted use of the  1158 St. Jude lead failed because  I was not able  to pass it past the curvatures.  Given the size of this vein, it was  appreciated that this was not related to the orifice, but the angle of  traversing of the leads and I tried a variety of ways with wires to  allow the lead to pass, it was that was ultimately unsuccessful.  We  then took and attained two 135 catheter.  This allowed for cannulation  of the vein and then subselection of this vein with the attained two  sheaths.  We then passed through this a 41 x 96 Medtronic LV lead serial  # [id number].  Thankfully, in our contrast free working window, the  patient had a vein that coursed up on the lateral wall, so that we were  able to deploy the entire curvature of the lead up into this branch.  In  this final location, the L wave had an amplitude of 30 with a pace  impedance of 507, with threshold  1.6 and 0.5 milliseconds and current  threshold 3.8 MA.  We then tried a variety of pacing configurations and  we planned to try and use the ring to coil cpnfiguration.  In any case,  these leads were then secured to the prepectoral fascia.  Following  removal of the 2 delivery systems under fluoroscopic guidance ensuring  stable location, the leads were secured and then attached to Promote RF  ICD model [id number].  Through the device, the R-wave was 4.8 with a  pacing impedance of 430 ohms and threshold of 0.75 @ 0.5 and the LV  impedance was 390 with threshold of 1 volt at 0.8.  The high-voltage  impedance was 55 ohms.  At this point, the pocket was copiously  irrigated with antibiotic containing saline solution.  Hemostasis was  assured.  The leads and the pulse generator were placed in the pocket,  secured to the prepectoral fascia, and the wound was closed in 3 layers  in the normal fashion.  The wound was washed, dried, and a benzoin Steri-  Strip dressings were applied.  The patient was then prepared for AV  junction ablation.      Duke Salvia, MD, [facility]  Electronically Signed     SCK/MEDQ  D:  05/06/2008  T:  05/07/2008  Job:  [id number]

## 2010-11-23 NOTE — Discharge Summary (Signed)
NAME:  [name], CAMPILLO NO.:  [id number]   MEDICAL RECORD NO.:  [id number]          PATIENT TYPE:  INP   LOCATION:  2003                         FACILITY:  [facility]   PHYSICIAN:  Luis Abed, MD, FACCDATE OF BIRTH:  12/23/1945   DATE OF ADMISSION:  06/10/2008  DATE OF DISCHARGE:  06/19/2008                               DISCHARGE SUMMARY   ADDENDUM:   ADDITIONAL DISCHARGE MEDICATIONS:  1. Chlorhexidine rinse 0.12% 15 mL swish and spit b.i.d.      Nicolasa Ducking, ANP      Luis Abed, MD, [facility]  Electronically Signed    CB/MEDQ  D:  06/19/2008  T:  06/19/2008  Job:  [id number]   cc:   Salvatore Decent. Cornelius Moras, M.D.  Dr. Veneta Penton, D.D.S.

## 2010-11-23 NOTE — Discharge Summary (Signed)
NAME:  [name], [name] NO.:  [id number]   MEDICAL RECORD NO.:  [id number]          PATIENT TYPE:  INP   LOCATION:  2018                         FACILITY:  [facility]   PHYSICIAN:  Salvatore Decent. Cornelius Moras, M.D. DATE OF BIRTH:  [birth year]   DATE OF ADMISSION:  07/14/2008  DATE OF DISCHARGE:                               DISCHARGE SUMMARY   PRIMARY ADMITTING DIAGNOSES:  1. Severe mitral regurgitation.  2. Patent foramen ovale.  3. Streptococcus viridans bacterial endocarditis.   ADDITIONAL/DISCHARGE DIAGNOSES:  1. Severe mitral regurgitation.  2. Secundum atrial septal defect.  3. Streptococcus viridans bacterial endocarditis.  4. Permanent atrial fibrillation status post atrioventricular node      ablation and permanent dual-chamber implantable cardioverter-      defibrillator pacemaker placement in October 2009.  5. History of acute myocardial infarction in October 2009.  6. Recurrent congestive heart failure, class IV.  7. Recurrent syncope.  8. Chronic obstructive pulmonary disease.  9. History of hemorrhagic cerebrovascular accident in May 2009.  10.Hypertension.   PROCEDURES PERFORMED:  1. Removal of biventricular ICD and insertion of temporary RV pacing      lead on July 15, 2008.  2. Right mini-thoracotomy for mitral valve replacement (33-mm      Medtronic Mosaic porcine bioprosthesis), autologous pericardial      patch closure of atrial septal defect and closure of left atrial      appendage on July 17, 2008.  3. Insertion of biventricular implantable cardioverter defibrillator      on July 23, 2008.   HISTORY:  The patient is a 65 year old male with a complicated past  medical history.  He has a long-standing history of atrial fibrillation  with recurrent syncopal episodes and CHF which has been followed by Dr.  Myrtis Ser and Dr. Graciela Husbands and has ultimately been treated with AV node ablation  and ICD placement.  He had been controlled medically for his  congestive  heart failure until around November 2009 when he required admission to  the hospital with syncope and class IV congestive heart failure.  His  symptoms improved with diuretic therapy and he was discharged home.  However, it was noted that blood cultures obtained during the hospital  stay were positive for Strep viridans and the patient was subsequently  readmitted on June 10, 2008 for further investigation.  Since that  time, he has had multiple sets of blood cultures which have been  positive for Strep viridans.  An echocardiogram showed persistent mitral  regurgitation in the severe range with a flail anterior leaflet and  possible vegetations.  A transesophageal echocardiogram was also  performed which confirmed the above findings as well as the presence of  a small patent foramen ovale.  At that time, a Cardiothoracic Surgery  consultation was obtained.  Dr. Tressie Stalker saw the patient and  reviewed his films.  It was felt at that time that he should undergo a  Neurology consultation in light of his history of hemorrhagic stroke and  inability to tolerate anticoagulation.  I have also felt that he should  undergo a  left and right heart catheterization for further delineation  of his coronary anatomy prior to any intervention of his valve.  He did  undergo a cardiac catheterization on June 17, 2008 and was found to  have no significant coronary artery stenosis with pulmonary hypertension  and mitral regurgitation.  He was also reevaluated by Neurology and was  again thought to be not a candidate for long-term anticoagulation with  Coumadin.  He has been treated with 4 weeks of IV antibiotics and blood  cultures have since cleared.  He has also have been seen by Dr. Kristin Bruins  for a dental evaluation and treatment prior to proceeding with surgery.  At this time, it is felt that the patient should be re-admitted to the  hospital for removal of permanent pacemaker,  placement of a temporary  pacemaker and to proceed with intervention on his mitral valve.  All  parties involved in his care have discussed the risks, benefits and  alternatives of surgery with the patient and he has agreed to proceed.   HOSPITAL COURSE:  Mr. Stainback was admitted to [facility] on  July 14, 2008.  He was taken to the Cath Lab by Dr. Ladona Ridgel and  underwent removal of his previously implanted biventricular ICD and a  new temporary RV pacing lead was placed.  The patient tolerated the  procedure well and he was returned to the floor in stable condition.  He  remained stable and was seen by Pulmonary for preoperative clearance.  He underwent PFTs and was found to have severe obstructive airway  disease and his condition was optimized with inhaler and nebulizer  therapy prior to surgery.  He was ultimately taken to the operating room  on July 17, 2008 where he underwent a right thoracotomy and mitral  valve replacement as well as closure of PFO and left atrial appendage  performed by Dr. Tressie Stalker.  He tolerated the procedure well and was  transferred to the SICU in stable condition.  He initially had a large  amount of drainage from his left pleural tube, however, it was felt that  this was secondary to a large preoperative effusion and this was watched  conservatively.  He remained stable and was slowly weaned from the  ventilator per protocol.  He was started on diuretic therapy and  responded well.  He was slowly mobilized with cardiac rehab phase 1 and  made good progress.  He was treated with aggressive pulmonary toilet  measures and was followed by Pulmonary and Critical Care  postoperatively.  By postop day #4, he was ready for transfer to the  step-down unit.  He has continued with treatment for his endocarditis  via a PICC line and he is tolerating the antibiotics therapy well.  He  was returned to the Cardiac Cath Lab on July 23, 2008 for placement   of a new biventricular ICD by Dr. Ladona Ridgel.  The device has been  interrogated and is working well.  A followup chest x-ray showed only  minimal evidence of pneumothorax.  He does have some mild atelectasis  and some mild pleural effusions but otherwise his x-rays have remained  stable.  He continues to slowly progress.  His incisions are all healing  well.  He is ambulating in the halls without difficulty.  He remained  somewhat volume overloaded but continues to diurese well on Lasix.  Also  he continued to have increased O2 requirements currently around 3 L to  maintain O2  sats greater than 90%.  His pulmonary medications have been  adjusted and he has been restarted on his preoperative Spiriva and  continued on Flovent as well as p.r.n. Combivent.  We will continue to  work on weaning and subsequently discontinuing his supplemental oxygen.  Hopefully, this will be resolved at the time of discharge.   His most recent labs show a sodium 134, potassium 4.2, BUN 10,  creatinine 0.6.  White count 7.3, hemoglobin 9.6, hematocrit 28.8,  platelets 214.  Followup blood cultures drawn on admission had been  negative x2.  It is Dr. Orvan July opinion that he will require an  additional 2 weeks worth of IV antibiotics and home health will be  arranged.  Overall, he is progressing well and it is anticipated that he  may be ready for discharge home within the next 2-3 days.   Discharge medications will be as follows.  1. Lasix 40 mg daily.  2. Potassium 20 mEq daily.  3. Coreg 3.125 mg b.i.d.  4. Axid 150 mg b.i.d.  5. Enteric-coated aspirin 325 mg daily.  6. Flovent 220 mcg 2 puffs b.i.d.  7. Rocephin 2 g IV daily x2 weeks.  8. Spiriva 18 mcg daily.  9. Oxycodone 5 mg one to two q.4 h. p.r.n. for pain.   DISCHARGE INSTRUCTIONS:  He was asked to refrain from driving, heavy  lifting or strenuous activity.  He may continue ambulating daily and  using his incentive spirometer.  He may shower daily  and clean his  incisions with soap and water.  He will continue a low-fat and low-  sodium diet.   DISCHARGE FOLLOWUP:  Mr. Klima will be scheduled to see Dr. Myrtis Ser in 2  weeks.  He will then follow up with Dr. Cornelius Moras on August 04, 2008 at 2:15  p.m. with a chest x-ray from [facility].  He also has followup  appointments with the [facility] and to see Dr. Ladona Ridgel in the coming  weeks.  In the interim, if he experiences problems or has questions, he  is asked to call our office immediately.      Coral Ceo, P.A.      Salvatore Decent. Cornelius Moras, M.D.  Electronically Signed    GC/MEDQ  D:  07/25/2008  T:  07/26/2008  Job:  [id number]   cc:   Luis Abed, MD, [facility]  Duke Salvia, MD, [facility]  Tinnie Gens A. Tawanna Cooler, MD

## 2010-11-23 NOTE — Assessment & Plan Note (Signed)
[facility]                                 ON-CALL NOTE   KOURY, RODDY                        MRN:          [id number]  DATE:07/04/2008                            DOB:          January 27, 1946    PRIMARY CARDIOLOGIST:  Luis Abed, MD, [facility]   Mr. Ricke's wife called this evening because she was concerned about  increasing lower extremity edema and weight gain.  She states that they  have done pretty well throughout the holidays, but she does admit to  some dietary indiscretions.  She feels that his weight is up about 9  pounds in the last couple of weeks.  He seems to be a little bit more  uncomfortable because of the increased lower extremity edema, but is not  having chest pain or new shortness of breath.   I reviewed the data.  Mr. Somera was recently taken off his potassium,  because his potassium level was 5.5.  He was put on Lasix with a dose  increase on June 25, 2008 from 20-40 mg a day.  I requested that she  gave him another 40 mg today.  She is to give him 40 mg p.o. b.i.d.  [facility] to request a home health nurse go  out tomorrow for BMET.  If his weight and lower extremity edema did not  improve on this increased Lasix dose, she is to contact me and he will  be brought in for IV diuretics.  They were in agreement with this as a  plan of care.      Theodore Demark, PA-C  Electronically Signed      Luis Abed, MD, [facility]  Electronically Signed   RB/MedQ  DD: 07/04/2008  DT: 07/04/2008  Job #: [id number]

## 2010-11-23 NOTE — Assessment & Plan Note (Signed)
OFFICE VISIT   ANGELINO, RUMERY A  DOB:  1946/01/08                                        August 04, 2008  CHART #:  [id number]   HISTORY OF PRESENT ILLNESS:  The patient returns for followup status  post right thoracotomy for mitral valve replacement with closure of  atrial septal defect and closure of left atrial appendage on July 17, 2008.  Despite the patient's long list of multiple complicated problems  and chronic illness, he has done remarkably well postoperatively.  Following hospital discharge, he has continued to improve without  setback or complication.  He has been receiving IV Rocephin at home, and  is due to complete his course of antibiotics within the next week.  He  returns to the office today with his wife, and he probably reports that  he feels better than he has in more than 10 years.  He reports  essentially no shortness of breath.  He has been walking some at home,  and getting around reasonably well.  He has still been using oxygen at  home, but his oxygen saturation is 98% on 2 liters and 93% on room air  here in our office today.  His appetite is excellent.  He has mild  soreness in the right chest wall related to his thoracotomy incision,  but he has not required any sort of pain medication.  He has not had any  dizzy spells or tachypalpitations.  Overall, he feels remarkably well  and has no complaints.   CURRENT MEDICATIONS:  Lasix 40 mg twice daily, Coreg 3.125 mg twice  daily, nizatidine 150 mg twice daily, enteric-coated aspirin 325 mg  daily, Spiriva 1 capsule daily, Advair Diskus 1 puff twice daily,  Combivent inhaler as needed, potassium chloride 20 mEq twice daily, and  Rocephin 2 g IV daily.   PHYSICAL EXAMINATION:  GENERAL:  Notable for a thin white male who  appears much better than he has in the past.  VITAL SIGNS:  Blood pressure is 93/50, pulse 68, and oxygen saturation  98% on 2 liters and 93% on room air.  HEENT:  Unrevealing.  CHEST:  The small incision from pacemaker defibrillator placement in the  right deltopectoral groove is healing nicely and there is no sign of  fluid around the defibrillator device itself.  The incision on the  contralateral side is also healing well.  The right thoracotomy incision  is healing nicely.  Breath sounds are remarkably clear to auscultation.  No wheezes or rhonchi are noted and breath sounds are symmetrical.  CARDIOVASCULAR:  Regular rate and rhythm.  No murmurs, rubs, or gallops  are noted.  ABDOMEN:  Soft, nontender.  EXTREMITIES:  Warm and well perfused.  There are no lower extremity  edema.  The right groin incision is healing nicely.   DIAGNOSTIC TEST:  Chest x-ray obtained today at the [facility] is reviewed.  This demonstrates improved aeration with trivial  bilateral pleural effusions, much better than the last x-ray prior to  hospital discharge.   IMPRESSION:  The patient looks remarkably good and is getting along  quite well.  He is now nearly completed his course of IV antibiotics.  He may no longer require oxygen.   PLAN:  I have encouraged the patient to continue to increase his  physical activity as tolerated without particular limitation.  I have  suggested that we try to wean him off the oxygen at home and it might be  that he will be able to get rid of the oxygen altogether.  Once his IV  antibiotics has completed, his PICC line should be removed as soon as  practical.  We will plan to see the patient back in 6 weeks for a  routine followup.  All of his questions have been addressed.   Salvatore Decent. Cornelius Moras, M.D.  Electronically Signed   CHO/MEDQ  D:  08/04/2008  T:  08/04/2008  Job:  [id number]   cc:   Luis Abed, MD, [facility]  Duke Salvia, MD, [facility]  Tinnie Gens A. Tawanna Cooler, MD  Lacretia Leigh Ninetta Lights, M.D.

## 2010-11-26 NOTE — Op Note (Signed)
[place]  Patient:    [name], [name] Visit Number: [id number] MRN: [id number]          Service Type: Attending:  Nathen May, M.D., [facility] Dictated by:   Nathen May, M.D., [facility] Proc. Date: 05/23/01   CC:         Corwin Levins, M.D. [facility]  Electrophysiology Lab   Operative Report  PREOPERATIVE DIAGNOSIS:  Syncope.  POSTOPERATIVE DIAGNOSIS:  Syncope.  PROCEDURE:  Insertion of an implantable loop monitor.  INDICATION:  [name] was admitted for implantable loop insertion following a negative electrophysiology study for syncope.  DESCRIPTION:  On obtaining informed consent the patient was brought to the cardiac catheterization laboratory and prepped in the usual fashion.  After routine draping, lidocaine was infiltrated 2-3 cm below and 2-3 cm lateral to the clavicle and sternum respectively.  This incision was carried down to the layer of the prepectoral fascia using sharp dissection and the pocket was formed using sharp dissection and electrocautery.  Hemostasis was obtained.  Then a Medtronic Reveal Plus [id number] implantable loop was inserted, serial number [id number].  It was secured to the cephalad portion of the pocket using two 2-0 sutures.  The pocket was copiously irrigated with antibiotic-containing saline solution.  Hemostasis was again ensured and the wound was then closed in three layers in the normal fashion.  The wound was washed, dried, and benzoin, Steri-Strips, dressing were then applied.  Needle counts, sponge counts, and instrument counts were correct after the procedure according to the staff.  COMPLICATIONS:  None apparent. Dictated by:   Nathen May, M.D., [facility] Attending:  Nathen May, M.D., [facility] DD:  05/23/01 TD:  05/23/01 Job: [id number]

## 2010-11-26 NOTE — Discharge Summary (Signed)
[place]  Patient:    [name], [name] Visit Number: [id number] MRN: [id number]          Service Type: MED Location: 2000 2030 01 Attending Physician:  Corwin Levins Dictated by:   Cornell Barman, P.A. Admit Date:  05/16/2001 Disc. Date: 05/24/01   CC:         Doylene Canning. Ladona Ridgel, M.D. [facility]  Luis Abed, M.D. [facility]  Evette Georges, M.D. [facility]   Discharge Summary  DISCHARGE DIAGNOSES: 1. Syncope. 2. Atrial fibrillation with rapid ventricular response. 3. Low normal ejection fraction. 4. Hyperglycemia. 5. Elevated total bilirubin. 6. Hypertriglyceridemia.  HISTORY OF PRESENT ILLNESS:  The patient is a 65 year old white male who had a sudden episode of syncope around 2:15 a.m. on the morning of admission.  He described loss of consciousness for approximately four seconds.  This was witnessed by his wife.  She denied any evidence of jerking or incontinence. He did not have any post loss of consciousness confusion.  The patient felt well on standing after falling.  He had no presyncopal nausea or dizziness.  By Dr. Clent Ridges in the office, he was found to be in atrial fibrillation with a rapid ventricular response.  He then sent him to the emergency room.  The patient was given IV Cardizem with good control of his rate.  PAST MEDICAL HISTORY: 1. History of SVT. 2. Elevated triglycerides. 3. History of pneumothorax, spontaneous, on the right in the 1970s.  HOSPITAL COURSE:  Syncope:  The patient was admitted for both cardiac and neurologic workup.  As noted, the patient did have atrial fibrillation with rapid ventricular response on admission.  This was managed with a Cardizem drip.  He did convert to sinus rhythm.  Cardiology was asked to see the patient.  They did obtain a 2D echo.  This revealed his overall left ventricular systolic function to be at the lower limits of normal.  His EF was estimated to be 50% to 55%.  The etiology of the  syncope was still unclear and could not exclude an event secondary to an arrhythmia, tachybrady arrhythmia. They recommended pursuing a Cardiolite which was negative for any ischemia. At that point, we did pursue an EP study.  Dr. Ladona Ridgel consulted on the patient.  The patient had an EP study on May 23, 2001 that was essentially normal.  He then had an inflatable loop recorder placed on May 23, 2001 secondary to syncope without any obvious source.  The patient has not had any further syncopal episodes.  Of note, we also asked neurology to evaluate for any neurologic cause for his syncope. They noted that his MRA was negative for any vertebrobasilar insufficiency and that his EEG was normal.  In other words, there was no neurologic source of syncope identified either.  At this time, in regards to his Coumadin, we will hold off on Coumadin. Dr. Ladona Ridgel feels that the patient is still at high risk for fall related to syncope and would not want to start Coumadin.  He did state, however, if the patient did not have any further syncopal episodes in the next six months that he would consider starting him on Coumadin at that time.  DISCHARGE DIAGNOSTIC STUDIES:  Coags are normal.  Hemoglobin was 15.1.  Total bilirubin was 2.3.  Hemoglobin A1C was 5.7.  Total cholesterol was 135. Triglycerides were elevated at 248, HDL 36, LDL 49.  TSH was 0.445.  MR/MRA of the head:  There  was intracranial atherosclerotic changes in the small to medium-size vessel distal the MCA trifurcation.  There is no acute finding.  No sign of stroke or other acute intracranial abnormalities.  There was a small arachnoid cyst dorsal to the mid brain to the right of the midline, likely incidental finding.  DISCHARGE MEDICATIONS: 1. Inderal 80 mg q.d. 2. Digoxin 0.125 mg q.d. 3. Aspirin 325 mg q.d. 4. Axid 150 mg q.d. 5. No Coumadin at this time.  WOUND CARE:  Keep clean and dry.  No shower for one week.  The  Steri-Strips will be removed on follow up with Dr. Ladona Ridgel.  FOLLOW-UP APPOINTMENT:  Follow up with Dr. Ladona Ridgel June 11, 2001 at 12 noon and with Dr. Tawanna Cooler in two to three weeks. Dictated by:   Cornell Barman, P.A. Attending Physician:  Corwin Levins DD:  05/24/01 TD:  05/24/01 Job: [id number]

## 2010-11-26 NOTE — Consult Note (Signed)
[place]. [facility]  Patient:    [name], BUENGER Visit Number: [id number] MRN: [id number]          Service Type: MED Location: 2000 2030 01 Attending Physician:  Corwin Levins Dictated by:   Marlan Palau, M.D. Proc. Date: 05/21/01 Admit Date:  05/16/2001   CC:         Corwin Levins, M.D. [facility]  [facility]  [address]  Luis Abed, M.D. [facility]   Consultation Report  HISTORY OF PRESENT ILLNESS:  Elana Alm. Mullin is a 65 year old left-handed white male born March 07, 1946 with a history of hyperlipidemia.  He has a history of atrial flutter with episodes of elevated heart rate in the past. This patient had an episode of very sudden onset of syncope that occurred on the day of admission.  The patient was at home, got up in the morning to go to work, and went to the bathroom, got a glass of water and came back, and then suddenly blacked out.  The patient notes no sensation of dizziness, light-headed sensations prior to the blackout.  Wife heard the fall and noted the patient was limp, not stiff or jerking on the floor of the room.  The patient hit his right maxillary area.  The patient came to in just a few seconds.  The patient was not diaphoretic or nauseated following the event.  The patient was brought to the emergency room.  He was noted to have atrial flutter with elevated heart rates in the 130 range with a systolic blood pressures down to the 90 range.  The patient was treated with Cardizem, has improved with the rates, and converted to normal sinus rhythm.  This patient has undergone fairly extensive workup with electrophysiology evaluation that has been relatively unremarkable.  The patient has had a MRI scan of the brain that shows no acute changes noted.  A small arachnoid cyst dorsal to the brain was noted.  The patient had myocardial perfusion study with no evidence of a perfusion defect. CT of the head was  unremarkable.  TEE was also performed showing left ventricular chamber as mildly dilated.  Ejection fraction 50-55%.  Aortic valve was mildly calcified.  Moderate aortic root dilation was noted.  There was mild mitral valve prolapse, mill mitral valve regurgitation, left atrium was mildly to moderately dilated.  Right atrium was mildly to moderately dilated.  No septal aneurysm or shunt was seen.  EEG studies also had been performed and was unremarkable.  Neurology was asked to see this patient for further evaluation.  PAST MEDICAL HISTORY:  Significant for history of syncope as above.  Atrial flutter with increased heart rate.  History of hyperlipidemia.  SVT.  History of pneumothorax in the past.  The patient currently is on IV heparin.  MEDICATIONS: 1. The patient is on aspirin one a day. 2. Inderal 80 mg q.d. 3. Lanoxin 0.125 mg q.d. 4. Restoril if needed.  ALLERGIES:  The patient states no known allergies.  HABITS:  Smokes 10 cigarettes a day.  Does not drink alcohol.  SOCIAL HISTORY:  This patient is married and lives in the [place] area.  He is employed.  FAMILY HISTORY:  Notable that father is alive with history of cancer.  Mother died with stroke.  The patient has a brother that is alive with a history of cancer.  REVIEW OF SYSTEMS:  Notable for no fevers or chills.  The  patient denies headaches, visual field changes.  Denies any shortness of breath, nausea, or vomiting.  Denies chest pain.  Denies any vertigo.  The patient has no prior history of syncope.  PHYSICAL EXAMINATION:  VITAL SIGNS:  Blood pressure is 100/78, heart rate is 80, respiratory rate 20, temperature afebrile.  GENERAL:  This patient is a fairly well-developed white male who is alert and cooperative at the time of the examination.  HEENT:  Head reveals ecchymosis around the right eye.  Abrasion on the right maxillary region.  Pupils are equal, round and reactive to light.   Disks are flat bilaterally.  NECK:  Supple.  No carotid bruits noted.  LUNGS:  Respiratory examination is clear.  CARDIOVASCULAR:  Regular rate and rhythm without obvious murmurs or rubs noted.  EXTREMITIES:  Without significant edema.  NEUROLOGICAL:  Cranial nerves as above.  Facial symmetry is present.  The patient has good sensation to face with pinprick and soft touch bilaterally. Patient has good strength to facial muscles and to muscles with head turning and shoulder shrug bilaterally.  The patient has full visual fields.  Good speech pattern.  No aphasia seen.  Motor testing reveals 5/5 strength in all fours.  Good symmetric motor tone is noted throughout.  Sensory testing is intact to pinprick, soft touch, vibratory sensation throughout.  The patient has good finger-nose-finger, heel-to-shin.  The patient has no drift.  Gait was not tested.  Deep tendon reflexes are depressed but symmetric throughout. Toes are neutral bilaterally.  LABORATORY DATA:  Notable for white count 5.5, hemoglobin 15.3, hematocrit 43.8, MCV of 89.2, platelets of 155,000.  INR of 1.0, sodium of 138, potassium 4.3, chloride of 100, CO2 of 27, glucose of 193, BUN of 17, creatinine of 1.0, calcium at 9.3, total protein 6.5, albumin 3.6.  AST of 24, ALT of 14, ALP 65, total bilirubin of 2.3, magnesium 1.9.  CPK of 96.  Troponin I of 0.3.  IMPRESSION: 1. History of syncope as above. 2. Atrial flutter. 3. Hypotension on admission.  DISPOSITION:  This patient appears to have had a syncopal event that is likely cardiogenic in nature associated with hypotension.  The patient had an event associated with rising from bed early in the morning and with onset of rapid  ventricular response with atrial flutter and hypotension.  The two events combined together likely is what lead to the syncopal event.  I doubt that this event was a primary neurologic episode.  An EEG study today was normal. A MRI scan of the  brain done previously was normal.  We will pursue a MR angiogram to fully exclude a vertebral basilar insufficiency, but I doubt that this is a significant risk for this patient.  PLAN:  Check orthostatic blood pressures.  MR angiogram.  Ongoing platelet therapy recommended. Dictated by:   Marlan Palau, M.D. Attending Physician:  Corwin Levins DD:  05/21/01 TD:  05/21/01 Job: [id number]

## 2010-11-26 NOTE — H&P (Signed)
[place]. [facility]  Patient:    [name], [name] Visit Number: [id number] MRN: [id number]          Service Type: MED Location: [phone] Attending Physician:  Nelwyn Salisbury Dictated by:   Gordy Savers, M.D. [facility] Admit Date:  09/12/2001                           History and Physical  CHIEF COMPLAINT:  Lower extremity swelling.  HISTORY OF PRESENT ILLNESS:  The patient is a 65 year old gentleman with a history of paroxysmal atrial fibrillation who was last admitted to the hospital on November 6th of 2002 for atrial fibrillation with rapid ventricular response.  It was associated with cardiac syncope.  The patient has had a four-week history of worsening lower extremity edema.  He presented to the emergency department approximately two weeks ago for evaluation and was discharged on no new medications.  He was seen in the office of his primary care Donnelle Olmeda on the afternoon of admission, where he was noted to be in atrial fibrillation with rapid ventricular response and sent to the emergency department for evaluation.  Evaluation included a chest x-ray that revealed cardiomegaly, bilateral small effusions and some edematous changes consistent with congestive heart failure.  The patient is now admitted for further evaluation and treatment of his atrial fibrillation with rapid ventricular response.  PAST MEDICAL HISTORY:  As mentioned, the patient was hospitalized approximately four months ago for uncontrolled atrial fibrillation.  He had an extensive cardiac and neurologic evaluation; this included EP study that was negative, a 2-D echocardiogram revealed an ejection fraction in the low-normal range of 50-55%, an adenosine Cardiolite stress test was negative. Neurological workup included an electroencephalogram as well as an MRA that were essentially negative.  During that hospital admission, an implantable loop recorder was inserted.  Medical  problems include COPD, type 2 diabetes, history of hyperbilirubinemia and hypertriglyceridemia.  He has remote history of a spontaneous pneumothorax in the 1970s and a history of ongoing tobacco use.  MEDICATIONS:  His medical regimen includes the following: 1. Axid 150 mg b.i.d. 2. Aspirin 325 mg daily. 3. Lanoxin 0.125 mg daily. 4. The patient apparently has taken both propranolol 80 mg daily and atenolol    100 mg daily, one of which was a preadmission medication and the other a    discharge medication.  SOCIAL HISTORY:  The patient is employed and married and lives with his wife. He continues to smoke one to one and one-half packs of cigarettes daily.  FAMILY HISTORY:  Please see prior note.  Father is living with a history of cancer.  Mother died of cerebrovascular disease.  PHYSICAL EXAMINATION:  VITAL SIGNS:  Blood pressure 130/80, pulse 130 to 150 and irregular, O2 saturation 93%.  GENERAL:  Exam revealed a well-developed, healthy-appearing male in no acute distress.  SKIN:  Unremarkable without rash.  HEENT:  Normal pupillary responses.  Conjunctivae clear.  ENT negative.  NECK:  No obvious neck vein distention and no bruits were appreciated.  CHEST:  Some ______ diminished breath sounds at the bases but essentially clear.  CARDIOVASCULAR:  Rapid irregular rhythm without murmurs or gallops.  ABDOMEN:  Soft, nontender.  No organomegaly.  GU:  External genitalia normal.  EXTREMITIES:  Peripheral edema +2 to 3.  The right dorsalis pedis pulse was full; other peripheral pulses were not easily palpable.  NEUROLOGIC:  Negative.  IMPRESSION: 1. Recurrent  atrial fibrillation with rapid ventricular response. 2. Mild congestive heart failure.  DISPOSITION:  The patient will be admitted to the hospital to a telemetry setting.  He will be carefully diuresed.  He will be maintained on digoxin and beta blocker therapy and will be placed on IV Cardizem for rate control.   He will also be placed on IV heparin. Dictated by:   Gordy Savers, M.D. [facility] Attending Physician:  Nelwyn Salisbury DD:  09/12/01 TD:  09/13/01 Job: [id number]

## 2010-11-26 NOTE — Cardiovascular Report (Signed)
[place]. [facility]  Patient:    ALEXSANDER, CAVINS Visit Number: [id number] MRN: [id number]          Service Type: MED Location: 2000 2030 01 Attending Physician:  Corwin Levins Dictated by:   Nathen May, M.D., [facility] Proc. Date: 05/23/01 Admit Date:  05/16/2001   CC:         Corwin Levins, M.D. [facility]  Electrophysiology Laboratory  [place], Attention: Device Clinic   Cardiac Catheterization  PREOPERATIVE DIAGNOSIS:  Syncope with longstanding history of tachycardic palpitations.  POSTOPERATIVE DIAGNOSIS:  Syncope with longstanding history of tachycardic palpitations.  PROCEDURE:  Invasive electrophysiological study and isoproterenol infusion.  CARDIOLOGIST:  Nathen May, M.D., [facility]  DESCRIPTION OF PROCEDURE:  Following obtained informed consent, the patient was brought to the electrophysiology laboratory and placed on the fluoroscopic table in the supine position.  After routine prep and drape, cardiac catheterization was performed with local anesthesia and conscious sedation. Noninvasive blood pressure monitoring and transcutaneous oxygen saturation monitoring performed continuously throughout the procedure.  Following the procedure, the catheter was removed.  Hemostasis was obtained, and the patient was transferred to the catheterization lab in stable condition.  Catheters:  A 5 French quadripolar catheter was inserted via the left femoral vein to high right atrium.  A 5 French quadripolar catheter was inserted via the left femoral vein to the A-V junction.  A 5-French quadripolar catheter was inserted via the left femora vein to the right ventricular apex.  Surface leads I, aVF, and V1 were monitored continuously throughout the procedure.  Following insertion of the catheters, the stimulation protocol included: 1. Incremental atrial pacing. 2. Incremental ventricular pacing. 3. Single atrial extrastimuli at 600  and 500 msec. 4. Single ventricular extrastimuli at pacing cycle length of 600 msec.  RESULTS: Surface Electrocardiogram:   Rhythm                         Sinus   Cycle Length                 1007 msec   P-R Interval                  170 msec   QRS Duration                   92 msec   Q-T Interval                  409 msec   P-Wave Duration               114 msec   Bundle Branch Block            Absent   Pre-excitation                 Absent  AV Nodal Function:   Baseline A-H INterval         085 msec   AV Wenckebach                 300 msec   VA Wenckebach                 350 msec   Retrograde conduction        discontinuity.  AV Nodal effective refractory period at a paced cycle length of 600 msec was 270 msec  Accessory Pathway Function: No evidence of accessory pathway was identified in the absence or presence  of isoproterenol.  His-Purkinje System Function: H-V Interval was 49 msec. His bundle duration is 14 msec.  Ventricular response to programmed stimulation:  Normal for stimulations described above.  IMPRESSION: 1. Normal sinus function. 2. Normal atrial function. 3. Normal antegrade atrioventricular nodal function with retrograde    discontinuity without inducible supraventricular tachycardia. 4. Normal His-Purkinje system function. 5. No evidence of accessory pathway. 6. Normal ventricular response as noted above.  SUMMARY AND CONCLUSION:  The results of electrophysiological testing failed to identify a substrate for supraventricular tachycardia which might be the triggering rhythm for the patients recurrent and longstanding atrial fibrillation.  Given the patients profound syncope that occurred without obvious provocation, unassociated with injury, will plan to proceed with loop recorder insertion. Dictated by:   Nathen May, M.D., [facility] Attending Physician:  Corwin Levins DD:  05/23/01 TD:  05/23/01 Job: [id number]

## 2010-11-26 NOTE — Consult Note (Signed)
. [facility]  Patient:    [name], [name] Visit Number: [id number] MRN: [id number]          Service Type: MED Location: 2000 2030 01 Attending Physician:  Corwin Levins Dictated by:   Chinita Pester, N.P. Proc. Date: 05/21/01 Admit Date:  05/16/2001   CC:         Corwin Levins, M.D. [facility]  Luis Abed, M.D. [facility]  Nathen May, M.D., [facility]  Evette Georges, M.D. [facility]   Consultation Report  REASON FOR CONSULTATION:  Syncope.  HISTORY OF PRESENT ILLNESS:  This is a 65 year old male with a past medical history of atrial fibrillation with a rapid ventricular response and borderline hypotension. He woke up and turned off his alarm clock, came from the bathroom back into his bedroom, remembers putting on glasses on a dresser. Next, he remembers his wife waking him on the floor. He hit the right side of his face on a dresser. He felt fine upon awakening. Denies presyncope or previous syncope. States a few years ago he had dizziness with standing too fast. Went to a family physician. He was sent to the emergency room and placed on a Cardizem drip for atrial fibrillation with a rate of 120 to 140. Denies chest pain, palpitations, or shortness of breath. He states that he was out for a few seconds and felt well awakening.  PAST MEDICAL HISTORY:  Positive for SVT, PAF for 18 years; hypotension; hypertriglyceridemia; spontaneous pneumothorax in the 1970s.  PAST SURGICAL HISTORY:  Right lung surgery for his pneumothorax.  ALLERGIES:  He states no known drug allergies.  SOCIAL HISTORY:  He lives with his wife. Positive tobacco, less than a pack a day. No alcohol.  FAMILY HISTORY:  His mother deceased of a CVA.  MEDICATIONS PRIOR TO ADMISSION: 1. Inderal 80 q.d. 2. Digoxin 0.125 daily. 3. Aspirin 325 daily.  REVIEW OF SYSTEMS:  HEENT:  Painful right orbit. CARDIOVASCULAR:  No chest pain or palpitations. RESPIRATORY:  No shortness of  breath or dyspnea. GI:  No nausea, vomiting, diarrhea. GU:  Negative. MUSCULOSKELETAL:  No joint pain.  LABORATORY DATA:  Last EKG May 2002 showed normal sinus rhythm. Present EKG shows atrial flutter with variable block.  Echo:  EF 50 to 55%, no wall motion abnormality.  Cardiolite was negative.  Telemetry shows atrial flutter, bradycardic at times, rate is in the 30s. November 11, sinus brady with PACs and November 11, normal sinus rhythm.  MRI of the brain showed no acute findings, small arachnoid cyst dorsal to midline.  Cranial CT was negative.  Chest x-ray:  Cardiomegaly, COPD, questionable bibasilar atelectasis.  C-spine with normal alignment.  WBCs 6.3, H&H 15.4 and 44.4, platelets 161. INR of 1.1, PTT 87. Cardiac enzymes are negative. Sodium 140, potassium 4, chloride 101, CO2 28, BUN 13, creatinine 0.7, glucose was 128. TSH 0.445. Digoxin level was 0.8.  PHYSICAL EXAMINATION:  VITAL SIGNS:  97.8, 80, 20, 100/78, saturation was 96% on room air, telemetry normal sinus rhythm.  GENERAL:  This is a well-developed, 65 year old male sitting on the bed in no apparent distress.  HEENT:  Sclerae clear. Right orbit ecchymotic.  NECK:  Supple. Nontender. No bruits.  CARDIOVASCULAR:  Rate regular rhythm. Positive S1, S2. No murmur. PMI slightly displaced to the left.  CHEST:  Clear to auscultation bilaterally.  ABDOMEN:  Soft, round, nontender. Normoactive bowel sounds.  EXTREMITIES:  No edema.  NEUROLOGICAL:  Awake, alert and oriented x  3.  ASSESSMENT AND PLAN:  As per Doylene Canning. Ladona Ridgel, M.D. 1. Unexplained syncope. Symptoms worrisome for arrhythmia. 2. Borderline low left ventricular function. Could this represent    cardiomyopathy? 3. Paroxysmal atrial fibrillation.  PLAN:  Agree if normal MRI. If negative, EP testing probably warranted, plus or minus a loop recorder. Dictated by:   Chinita Pester, N.P. Attending Physician:  Corwin Levins DD:  05/22/01 TD:   05/22/01 Job: [id number]

## 2010-11-26 NOTE — Discharge Summary (Signed)
[place]. [facility]  Patient:    [name], [name] Visit Number: [id number] MRN: [id number]          Service Type: MED Location: [phone] Attending Physician:  Nelwyn Salisbury Dictated by:   Cornell Barman, P.A. Admit Date:  09/12/2001 Discharge Date: 09/19/2001   CC:         Evette Georges, M.D. [facility]  Doylene Canning. Ladona Ridgel, M.D. [facility]   Discharge Summary  DISCHARGE DIAGNOSES: 1. Atrial fibrillation. 2. Rapid ventricular response. 3. Congestive heart failure. 4. Hypoxemia.  HISTORY OF PRESENT ILLNESS:  [name] is a 65 year old, white male with a history of paroxysmal atrial fibrillation last admitted May 16, 2001.  At that time, he had atrial fibrillation with a rapid ventricular response.  It was also associated with cardiac syncope.  On this admission, the patient describes a four-week history of worsening lower extremity edema.  He did present to the emergency department two weeks prior to this admission for evaluation and was discharged on new medications.  The patient was seen in the office of his primary care giver on the afternoon of admission where he was noted to be in atrial fibrillation with a rapid ventricular response.  He was then sent to the emergency department for evaluation.  Evaluation in the ER revealed a chest x-ray with cardiomegaly, bilateral small effusions and some edematous changes consistent with congestive heart failure.  PAST MEDICAL HISTORY:  1. Cardiovascular, atrial fibrillation with a rapid ventricular response,     status post EP study that was negative.  There was no SVT to trigger the     atrial fibrillation.  The patient underwent an implantable loop recorder     at that time.  2. Preserved LV function by 2D echocardiogram in November 2002.  3. Adenosine Cardiolite stress test negative for ischemia.  4. EEG, MRI and MRA were negative for any neurological etiology for syncope.  5. COPD.  6. Adult-onset  diabetes mellitus, apparently diet controlled.  7. History of hyperbilirubinemia.  8. Hypertriglyceridemia.  9. History of spontaneous pneumothorax in the 1970s. 10. Continued tobacco use.  HOSPITAL COURSE:  #1 - CARDIOVASCULAR:  As noted, the patient presented in atrial fibrillation with rapid ventricular response.  We were able to control his rhythm with IV Cardizem.  We felt that the patient should be anticoagulated at this time as he apparently is at risk for stroke.  The patient was started on heparin and then Lovenox and Coumadin.  We did ask for EP to see the patient again.  Dr. Ladona Ridgel saw the patient and initially discussed permanent pacemaker with the patient.  However, after further reviewing the patient, he noted the patient had spontaneously converted to sinus rhythm on his own.  Therefore, he recommended three to four weeks of anticoagulation and then starting the patient on an antiarrhythmic medication and/or pursing a TEE/VCCV.  At this time, we are not considering permanent pacemaker placement.  Of note, the patient also had congestive heart failure on admission.  This was initially thought to be secondary to the atrial fibrillation.  However, we did repeat the echocardiogram which now shows EF to be moderately depressed at 40% with septal, lateral and posterior hypokinesis. The patient will be discharged home on a diuretic.  Of note, the patients weight at discharge as 183.9 pounds.  #2 - GASTROINTESTINAL/HYPERBILIRUBINEMIA:  As noted, the patient does have a history of this.  His bilirubins were quite elevated on admission and are  currently normalizing.  We did obtain an abdominal ultrasound that showed no gallbladder dilatation or obstruction.  We have not pursued any other workup.  #3 - ADULT-ONSET DIABETES MELLITUS:  CBGs were well-controlled in the hospital, but his hemoglobin A1C was mildly elevated indicating he may not have good glycemic control in the  outpatient setting.  #4 - ABNORMAL THYROID FUNCTION:  This is thought more likely to be secondary to thick euthyroid, but we would have his primary physician followup.  DISCHARGE LABORATORY DATA AND X-RAY FINDINGS:  Protein C was low at 43. Protime 14.8, INR 1.2.  Hemoglobin 17.5.  Total bilirubin 1.8, direct bilirubin 0.5, indirect bilirubin 1.3, otherwise LFTs were normal.  Hemoglobin A1C was 6.6%.  TSH was 0.272, FT4 1.29, FT3 2.7.  DISCHARGE MEDICATIONS: 1. Cardizem CD 240 mg q.d. 2. Axid 150 mg b.i.d. 3. Lasix 40 mg q.d. 4. Potassium chloride 20 mEq q.d. 5. Atenolol 50 mg b.i.d. 6. Digoxin 0.125 mg one tablet Monday, Wednesday, Friday and Sunday; 1.5 mg    tablet on Tuesday, Thursday and Saturday. 7. Lovenox 85 mg subcu q.12h. x5 days. 8. Coumadin, dose to be determined at discharge.  SPECIAL INSTRUCTIONS:  The patient is instructed not to take propranolol.  FOLLOWUP:  The patient is to follow up with Dr. Tawanna Cooler in two to three weeks. Follow up with Dr. Ladona Ridgel in three to four weeks.  I have called and left a message with his nurse, Larita Fife, and they should call to schedule follow-up appointment.  He has been given a phone number for Larita Fife if he does not hear from them.  He is to follow up at Dr. Barbette Or office on Friday, March 14, for a PT and BMET with results to be forwarded to Dr. Tawanna Cooler for management. Dictated by:   Cornell Barman, P.A. Attending Physician:  Nelwyn Salisbury DD:  09/19/01 TD:  09/21/01 Job: [id number]

## 2010-11-26 NOTE — H&P (Signed)
[place]. [facility]  Patient:    [name], [name] Visit Number: [id number] MRN: [id number]          Service Type: MED Location: 2000 2030 01 Attending Physician:  Corwin Levins Dictated by:   Corwin Levins, M.D. [facility] Admit Date:  05/16/2001   CC:         Evette Georges, M.D. [facility]  Luis Abed, M.D. [facility]   History and Physical  CHIEF COMPLAINT:  Sudden syncope, 0215 this morning with loss of consciousness of approximately four seconds.  HISTORY OF PRESENT ILLNESS:  [name] is a 65 year old white male with a history of atrial fibrillation with rapid ventricular response and borderline hypotension in the past who has been relatively well-controlled with medication for over 20 years who presents with the above complaint.  He was getting ready for work as he goes to work at 4 a.m. and was simply walking across the bedroom and found himself next waking up on the floor after he had fallen down on the right side of his face and neck.  He suffered a contusion and bruise to the right maxillary area as well as subconjunctival hemorrhage of the right eye.  This was very sudden.  Prior to that he had felt no orthostatic-like symptoms, nausea, or other symptoms.  He felt remarkably well, as well, standing up just after the fall with fairly good strength; no nausea, dizziness, palpitation, chest pain, shortness of breath, or other symptoms.  He got an appointment with Dr. Clent Ridges at 9 a.m. as an outpatient, was found to be in atrial fibrillation with rapid ventricular response and borderline hypotension, was sent to the emergency room where he was treated with IV Cardizem drip with good control of the heart rate now.  Blood pressure initially prior to the bolus was around 90-100 systolic, and despite the Cardizem bolus he has hung in there with blood pressure now 107/67 with heart rate 63, whereas it had been in the 120-140 range.  He never, throughout this ordeal,  had any palpitations, chest pain, shortness of breath.  Now complains only of mild weakness and some stiffness of the neck and swelling around the eye where he fell and hit the floor.  PAST MEDICAL HISTORY:  ILLNESSES: 1. History of SVT, paroxysmal atrial fibrillation, with borderline hypotension    per patient for the last 20 years. 2. Hypertriglyceridemia.  SURGERIES:  History of spontaneous pneumothorax of the right side in 1970.  ALLERGIES:  No known drug allergies.  CURRENT MEDICATIONS: 1. Inderal LA 80 mg p.o. q.d. 2. Lanoxin 0.125 mg p.o. q.d. 3. Ecotrin 325 mg p.o. q.d.  SOCIAL HISTORY:  Tobacco:  Less than one pack per day.  Alcohol:  None.  FAMILY HISTORY:  Mother deceased with a stroke.  REVIEW OF SYSTEMS:  Otherwise noncontributory except for a stiff neck.  PHYSICAL EXAMINATION:  VITAL SIGNS:  As above; otherwise, afebrile.  HEENT:  With bruise and contusion of the right cheek, right subconjunctival hemorrhage.  HEENT otherwise negative.  NECK:  Without JVD.  CHEST:  No rales or wheezing.  CARDIAC:  Irregularly irregular, no murmur.  ABDOMEN:  Soft, nontender.  Positive bowel sounds.  No organomegaly, no masses.  EXTREMITIES:  No edema.  NEUROLOGIC:  Cranial nerves 2-12 intact.  Otherwise, nonfocal.  LABORATORY DATA:  C spine films negative.  Chest x-ray with minor atelectasis and infiltrate; otherwise, no acute disease.  ECG with atrial fibrillation with rapid ventricular response as  per sent from Dr. Carver Fila office.  Troponin I normal.  CPK 93.  White blood cell count 10.8, hemoglobin 17.6. INR 1.0.  Glucose 193.  CMET otherwise within normal limits.  ASSESSMENT AND PLAN: 1. Atrial fibrillation with rapid ventricular response, now improved in the    emergency room with acute treatment with Cardizem bolus.  He will be    maintained on the Cardizem drip.  Continue medications as outpatient.  I    suspect hypotension as the etiology for syncope.  He  is relatively    insensitive to any symptoms related to this problem.  He is otherwise    doing well without acute illness that seems to have caused the    exacerbation.  I think he is not on Coumadin per Dr. Myrtis Ser because he may    have lone atrial fibrillation, apparently had a previous echocardiogram    negative per patient but this was done more than a year ago.  He will be    admitted.  We will continue the home medications and Cardizem drip as    well as usual home medications.  I will check TSH.  Rule out MI with    serial enzymes.  Telemetry, to be monitored.  Will follow up 2-D    echocardiogram and cardiology consult in the morning. 2. Hyperglycemia.  Apparently a new problem.  Unsure how significant this is.    Will check CBGs, apply some sliding scale insulin, and check overall    hemoglobin A1c. Dictated by:   Corwin Levins, M.D. [facility] Attending Physician:  Corwin Levins DD:  05/16/01 TD:  05/17/01 Job: [id number]

## 2010-11-26 NOTE — Consult Note (Signed)
[name]. [facility]  Patient:    JAH, ALARID Visit Number: [id number] MRN: [id number]          Service Type: MED Location: [phone] Attending Physician:  Nelwyn Salisbury Dictated by:   Doylene Canning. Ladona Ridgel, M.D. [facility] Proc. Date: 09/18/01 Admit Date:  09/12/2001   CC:         Gordy Savers, M.D., [facility]  Kathrine Cords, [facility]   Consultation Report  REASON FOR CONSULTATION:  Evaluation of atrial fibrillation with rapid ventricular rate associated with congestive heart failure.  The patients consultation is requested by Dr. Gordy Savers.  HISTORY OF PRESENT ILLNESS:  The patient is a very pleasant 65 year old man with a long history of atrial fibrillation (at least 18 years).  He was hospitalized back in November 2002 with syncope, atrial fibrillation, and rapid ventricular rates, and at that time evaluation was notable for an EF of 50 to 55%, an adenosine Cardiolite stress test which was negative for ischemia, and an EP study demonstrating no SVT or VT.   He underwent implantation of an implantable loop recorder.  Over the last several week, he has had progressive increasing dyspnea and was subsequently admitted with congestive heart failure.  In the hospital, his atrial fibrillation has been fairly poorly controlled with rates of over 120 beats per minute.  This is despite calcium channel blockers, A-V nodal blockers, and digoxin.   He is now admitted for additional evaluation.  He denies any additional syncope.   He denies chest pain.  PAST MEDICAL HISTORY:  As previously stated.  He also has a history of type 2 diabetes and COPD.  MEDICATIONS ON ADMISSION: 1. Axid 150 a day. 2. Aspirin daily. 3. Lanoxin 0.125 a day. 4. Atenolol 100 mg a day.  SOCIAL HISTORY:  The patient is employed and lives with his wife.  He still smokes over a pack of cigarettes a day.  FAMILY HISTORY:  Notable for father with a history of cancer.   His mother died of a stroke.  REVIEW OF SYSTEMS:  As per the HPI.  In addition, there is no headache or vision changes.  There are no hearing problems.  There is no sore throat. There is no cough or hemoptysis.  There is no nausea, vomiting, or diarrhea. There are no arthritic complaints.  PHYSICAL EXAMINATION:  GENERAL:  He is a pleasant, well-appearing, middle-aged man in no distress.  VITAL SIGNS:  Blood pressure was 120/70, pulse between 95 and 120 and irregularly irregular, respirations 16.  NECK:  No jugular venous distension.  The trachea was midline.  There was no thyromegaly.  CARDIOVASCULAR:  Irregular irregular rhythm without murmur.  LUNGS:  Clear bilaterally to auscultation.  ABDOMEN:  Soft, nontender, nondistended.  EXTREMITIES:  No cyanosis, clubbing, or edema.  DIAGNOSTIC DATA:  EKG demonstrates atrial fibrillation with a rapid ventricular rate.  IMPRESSION:  The patient has had poorly controlled atrial fibrillation despite fairly reasonable medical therapy.  In addition to this, he has had episodes of bradycardia in the past with documented heart rates in the 30s.  Possible treatment options would include A-V node ablation and permanent pacemaker versus implantation of permanent pacemaker and up titration of his A-V nodal blocking drugs.   Because of his long history of atrial fibrillation, I do not think he is a candidate for primary antiarrhythmic drug therapy.  I will have to review his records to confirm this.   His left ventricular dysfunction  may, in fact, be related to his poorly controlled heart rate.  I have discussed the treatment options with the patient, and he is to decide on these options.  We will plan to hold Coumadin. Dictated by:   Doylene Canning. Ladona Ridgel, M.D. [facility] Attending Physician:  Nelwyn Salisbury DD:  09/18/01 TD:  09/18/01 Job: [id number]

## 2010-12-07 ENCOUNTER — Telehealth: Payer: Self-pay | Admitting: Internal Medicine

## 2010-12-07 NOTE — Telephone Encounter (Signed)
Spoke with wife and explained lead alert issues.  They will send a manuel transmission today and we will schedule the next remote for 03/10/11.

## 2010-12-07 NOTE — Telephone Encounter (Signed)
Pt wife has question re two letters pt received re pt device. One letter was re a defect and the other re transmitting. Pt wife has question wants to talk to the device clinic.

## 2010-12-08 ENCOUNTER — Ambulatory Visit (INDEPENDENT_AMBULATORY_CARE_PROVIDER_SITE_OTHER): Payer: Federal, State, Local not specified - PPO | Admitting: *Deleted

## 2010-12-08 ENCOUNTER — Other Ambulatory Visit: Payer: Self-pay | Admitting: Internal Medicine

## 2010-12-08 DIAGNOSIS — I428 Other cardiomyopathies: Secondary | ICD-10-CM

## 2010-12-08 DIAGNOSIS — Z95810 Presence of automatic (implantable) cardiac defibrillator: Secondary | ICD-10-CM

## 2010-12-08 DIAGNOSIS — I5022 Chronic systolic (congestive) heart failure: Secondary | ICD-10-CM

## 2010-12-08 DIAGNOSIS — I4891 Unspecified atrial fibrillation: Secondary | ICD-10-CM

## 2010-12-08 NOTE — Progress Notes (Signed)
icd remote  

## 2010-12-31 ENCOUNTER — Encounter: Payer: Self-pay | Admitting: *Deleted

## 2011-02-28 ENCOUNTER — Other Ambulatory Visit: Payer: Self-pay | Admitting: Family Medicine

## 2011-03-10 ENCOUNTER — Other Ambulatory Visit: Payer: Self-pay | Admitting: Internal Medicine

## 2011-03-10 ENCOUNTER — Encounter: Payer: Self-pay | Admitting: Internal Medicine

## 2011-03-10 ENCOUNTER — Ambulatory Visit (INDEPENDENT_AMBULATORY_CARE_PROVIDER_SITE_OTHER): Payer: Federal, State, Local not specified - PPO | Admitting: *Deleted

## 2011-03-10 DIAGNOSIS — I428 Other cardiomyopathies: Secondary | ICD-10-CM

## 2011-03-10 LAB — REMOTE ICD DEVICE
BRDY-0002RV: 70 {beats}/min
BRDY-0004RV: 110 {beats}/min
LV LEAD IMPEDENCE ICD: 1075 Ohm
RV LEAD IMPEDENCE ICD: 480 Ohm
TZAT-0001SLOWVT: 1
TZAT-0004FASTVT: 8
TZAT-0004SLOWVT: 8
TZAT-0012SLOWVT: 200 ms
TZAT-0013FASTVT: 2
TZAT-0013SLOWVT: 4
TZAT-0018SLOWVT: NEGATIVE
TZAT-0019SLOWVT: 7.5 V
TZON-0005SLOWVT: 6
TZON-0010SLOWVT: 80 ms
TZST-0001FASTVT: 2
TZST-0001FASTVT: 3
TZST-0001FASTVT: 5
TZST-0001SLOWVT: 3
TZST-0001SLOWVT: 5
TZST-0003FASTVT: 36 J
TZST-0003SLOWVT: 36 J
VENTRICULAR PACING ICD: 100 pct

## 2011-03-17 ENCOUNTER — Ambulatory Visit: Payer: Federal, State, Local not specified - PPO | Admitting: Cardiology

## 2011-03-23 ENCOUNTER — Encounter: Payer: Self-pay | Admitting: *Deleted

## 2011-03-24 NOTE — Progress Notes (Signed)
ICD checked by remote. 

## 2011-03-29 ENCOUNTER — Encounter: Payer: Self-pay | Admitting: Cardiology

## 2011-03-29 DIAGNOSIS — I10 Essential (primary) hypertension: Secondary | ICD-10-CM | POA: Insufficient documentation

## 2011-03-29 DIAGNOSIS — I4821 Permanent atrial fibrillation: Secondary | ICD-10-CM | POA: Insufficient documentation

## 2011-03-29 DIAGNOSIS — E785 Hyperlipidemia, unspecified: Secondary | ICD-10-CM | POA: Insufficient documentation

## 2011-03-29 DIAGNOSIS — I629 Nontraumatic intracranial hemorrhage, unspecified: Secondary | ICD-10-CM | POA: Insufficient documentation

## 2011-03-29 DIAGNOSIS — I272 Pulmonary hypertension, unspecified: Secondary | ICD-10-CM | POA: Insufficient documentation

## 2011-03-29 DIAGNOSIS — I639 Cerebral infarction, unspecified: Secondary | ICD-10-CM | POA: Insufficient documentation

## 2011-03-29 DIAGNOSIS — I701 Atherosclerosis of renal artery: Secondary | ICD-10-CM | POA: Insufficient documentation

## 2011-03-29 DIAGNOSIS — I340 Nonrheumatic mitral (valve) insufficiency: Secondary | ICD-10-CM | POA: Insufficient documentation

## 2011-03-29 DIAGNOSIS — Z95810 Presence of automatic (implantable) cardiac defibrillator: Secondary | ICD-10-CM | POA: Insufficient documentation

## 2011-03-29 DIAGNOSIS — Q211 Atrial septal defect: Secondary | ICD-10-CM | POA: Insufficient documentation

## 2011-03-29 DIAGNOSIS — I38 Endocarditis, valve unspecified: Secondary | ICD-10-CM | POA: Insufficient documentation

## 2011-03-29 DIAGNOSIS — R9430 Abnormal result of cardiovascular function study, unspecified: Secondary | ICD-10-CM | POA: Insufficient documentation

## 2011-03-30 ENCOUNTER — Encounter: Payer: Self-pay | Admitting: Cardiology

## 2011-03-30 ENCOUNTER — Ambulatory Visit (INDEPENDENT_AMBULATORY_CARE_PROVIDER_SITE_OTHER): Payer: Federal, State, Local not specified - PPO | Admitting: Cardiology

## 2011-03-30 DIAGNOSIS — I428 Other cardiomyopathies: Secondary | ICD-10-CM

## 2011-03-30 DIAGNOSIS — E785 Hyperlipidemia, unspecified: Secondary | ICD-10-CM

## 2011-03-30 DIAGNOSIS — I4891 Unspecified atrial fibrillation: Secondary | ICD-10-CM

## 2011-03-30 NOTE — Assessment & Plan Note (Signed)
We need to check a fasting lipid profile and proceed with treatment if appropriate.

## 2011-03-30 NOTE — Patient Instructions (Signed)
Your physician recommends that you return for lab work  For a fasting cholesterol level.  Your physician recommends that you schedule a follow-up appointment in: 6 months with Dr. Myrtis Ser

## 2011-03-30 NOTE — Assessment & Plan Note (Signed)
He had AV node ablation so the rate of his atrial fib is no longer a problem.  He cannot be anticoagulated.

## 2011-03-30 NOTE — Assessment & Plan Note (Signed)
The patient is on appropriate medications.  I will check again on the dose of his beta blocker and ACE inhibitor and consider titrating the dose is up.

## 2011-03-30 NOTE — Progress Notes (Signed)
HPI The patient is seen today to followup his atrial fibrillation and mitral valve disease.  The patient's history is very complex.  I have reviewed all the data from the old record and carefully updated the new EMR..  The patient had a CNS bleed in the past.  He cannot be anticoagulated.  He also has had myocardial infarction.  He had severe mitral regurgitation and endocarditis.  Ultimately he underwent mitral valve replacement with a tissue mitral valve.  This was done through a lateral thoracotomy.  His atrial fib rate was fast.  His atrial appendage was oversewn at the time of surgery but he could not have a Maze procedure.  He received an ICD for LV dysfunction and for pacing function and then had his AV node ablated to control his atrial fib rate.  He has done remarkably well.  He is not anticoagulated and of course this is a significant risk with his arrhythmia.  However he does extremely well.  It is of note also that he had an incidental ASD repaired at the time of his surgery. Allergies  Allergen Reactions  . Warfarin Sodium     Current Outpatient Prescriptions  Medication Sig Dispense Refill  . amoxicillin (AMOXIL) 500 MG capsule Take 500 mg by mouth. Before dental work       . aspirin (BUFPIRIN) 325 MG buffered tablet Take 325 mg by mouth daily.        . carvedilol (COREG) 3.125 MG tablet        . Fluticasone-Salmeterol (ADVAIR) 500-50 MCG/DOSE AEPB Inhale 1 puff into the lungs every 12 (twelve) hours.        . furosemide (LASIX) 40 MG tablet Take 40 mg by mouth 2 (two) times daily.       . nitroGLYCERIN (NITROSTAT) 0.4 MG SL tablet Place 0.4 mg under the tongue every 5 (five) minutes as needed.        . nizatidine (AXID) 150 MG capsule Take 150 mg by mouth 2 (two) times daily.        . ramipril (ALTACE) 2.5 MG capsule Take 2.5 mg by mouth 2 (two) times daily.         History   Social History  . Marital Status: Married    Spouse Name: N/A    Number of Children: N/A  . Years of  Education: N/A   Occupational History  . Not on file.   Social History Main Topics  . Smoking status: Former Games developer  . Smokeless tobacco: Not on file   Comment: quit 2009  . Alcohol Use: No  . Drug Use: No  . Sexually Active: Not on file   Other Topics Concern  . Not on file   Social History Narrative  . No narrative on file    Family History  Problem Relation Age of Onset  . Stomach cancer Father   . Stroke      family history    Past Medical History  Diagnosis Date  . CAD (coronary artery disease)     CABG was not done at that time mitral valve replacement done through lateral thoracotomy  . Hypertension   . CVA (cerebral infarction)     2009  . Endocarditis     Bacterial, 2009  . Colon polyps   . Dyslipidemia   . COPD (chronic obstructive pulmonary disease)   . GERD (gastroesophageal reflux disease)   . Atrial fibrillation     AV Node ablation January, 2010, for rapid atrial  fib  . Cardiomyopathy     Ischemic  . Ejection fraction < 50%     EF 30%, echo, December, 2010, apex dyskinetic, moderate hypokinesis posterior wall and inferior wall  . Atrial septal defect     Closed with surgery January, 2010  . Mitral regurgitation     Severe,Mitral valve replacement  . ICD (implantable cardiac defibrillator) battery depletion     LV dysfunction and pacer needed for AV node lesion  . Intracranial hemorrhage     Coumadin cannot be used because of the history of his bleed  . Renal artery stenosis     Mild by history  . Pulmonary hypertension     Moderate  . HLD (hyperlipidemia)     Past Surgical History  Procedure Date  . Mitral valve replacement     Tissue valve, January, 2010  / Echo, December, 2010, valve working well  . Cardiac defibrillator placement     St Jude, Remote  . Penile prosthesis implant   . Appendectomy   . Other surgical history     biospy of left neck mass  . Leep   . Thoracotomy     right, for mitral valve replacement  . Mitral  valve replacement 07/17/2008    ROS  Patient denies fever, chills, headache, sweats, rash, change in vision, change in hearing, chest pain, cough, nausea vomiting, urinary symptoms.  All other systems are reviewed and are negative. PHYSICAL EXAM Patient has gained some weight.  He knows it is beginning to lose some.  He is oriented to person time and place.  Affect is normal.  Head is atraumatic.  There is no jugular venous extension.  Lungs are clear.  Respiratory effort is nonlabored.  Cardiac exam reveals S1 and S2.  The rhythm is regular because it is paced.  There is no mitral regurgitation heard.  His abdomen is soft.  He has no significant peripheral edema. Filed Vitals:   03/30/11 1353  BP: 112/68  Pulse: 70  Height: 6\' 2"  (1.88 m)  Weight: 209 lb (94.802 kg)   EKG is done today and reviewed by me.  It is paced and he has underlying atrial fib.  ASSESSMENT & PLAN

## 2011-03-31 ENCOUNTER — Encounter: Payer: Self-pay | Admitting: Family Medicine

## 2011-03-31 ENCOUNTER — Ambulatory Visit (INDEPENDENT_AMBULATORY_CARE_PROVIDER_SITE_OTHER): Payer: Federal, State, Local not specified - PPO | Admitting: Family Medicine

## 2011-03-31 VITALS — BP 94/60 | Temp 98.0°F | Wt 211.0 lb

## 2011-03-31 DIAGNOSIS — I10 Essential (primary) hypertension: Secondary | ICD-10-CM

## 2011-03-31 DIAGNOSIS — K219 Gastro-esophageal reflux disease without esophagitis: Secondary | ICD-10-CM

## 2011-03-31 DIAGNOSIS — J449 Chronic obstructive pulmonary disease, unspecified: Secondary | ICD-10-CM

## 2011-03-31 DIAGNOSIS — Z23 Encounter for immunization: Secondary | ICD-10-CM

## 2011-03-31 LAB — CBC WITH DIFFERENTIAL/PLATELET
Basophils Absolute: 0 10*3/uL (ref 0.0–0.1)
Eosinophils Absolute: 0.2 10*3/uL (ref 0.0–0.7)
Hemoglobin: 16.3 g/dL (ref 13.0–17.0)
Lymphocytes Relative: 15.9 % (ref 12.0–46.0)
MCHC: 33.2 g/dL (ref 30.0–36.0)
Monocytes Relative: 8.8 % (ref 3.0–12.0)
Neutro Abs: 5.2 10*3/uL (ref 1.4–7.7)
Neutrophils Relative %: 72.3 % (ref 43.0–77.0)
RBC: 5.21 Mil/uL (ref 4.22–5.81)
RDW: 15.9 % — ABNORMAL HIGH (ref 11.5–14.6)

## 2011-03-31 LAB — PSA: PSA: 0.99 ng/mL (ref 0.10–4.00)

## 2011-03-31 LAB — HEPATIC FUNCTION PANEL
AST: 15 U/L (ref 0–37)
Albumin: 4.1 g/dL (ref 3.5–5.2)
Alkaline Phosphatase: 80 U/L (ref 39–117)
Bilirubin, Direct: 0.3 mg/dL (ref 0.0–0.3)

## 2011-03-31 LAB — LIPID PANEL
HDL: 37.7 mg/dL — ABNORMAL LOW (ref >39.00)
Total CHOL/HDL Ratio: 4
VLDL: 51.8 mg/dL — ABNORMAL HIGH (ref 0.0–40.0)

## 2011-03-31 LAB — BASIC METABOLIC PANEL
Calcium: 9 mg/dL (ref 8.4–10.5)
Chloride: 101 mEq/L (ref 96–112)
Creatinine, Ser: 0.9 mg/dL (ref 0.4–1.5)
GFR: 85.92 mL/min (ref >60.00)
Potassium: 4 mEq/L (ref 3.5–5.1)

## 2011-03-31 LAB — TSH: TSH: 0.9 u[IU]/mL (ref 0.35–5.50)

## 2011-03-31 LAB — LDL CHOLESTEROL, DIRECT: Direct LDL: 80.2 mg/dL

## 2011-03-31 MED ORDER — NIZATIDINE 150 MG PO CAPS: 150.0000 mg | ORAL_CAPSULE | Freq: Two times a day (BID) | ORAL | Status: DC

## 2011-03-31 MED ORDER — FUROSEMIDE 40 MG PO TABS: 40.0000 mg | ORAL_TABLET | Freq: Two times a day (BID) | ORAL | Status: DC

## 2011-03-31 MED ORDER — RAMIPRIL 2.5 MG PO CAPS: 2.5000 mg | ORAL_CAPSULE | Freq: Two times a day (BID) | ORAL | Status: DC

## 2011-03-31 MED ORDER — FLUTICASONE-SALMETEROL 500-50 MCG/DOSE IN AEPB: 1.0000 | INHALATION_SPRAY | Freq: Two times a day (BID) | RESPIRATORY_TRACT | Status: DC

## 2011-03-31 MED ORDER — CARVEDILOL 3.125 MG PO TABS: 3.1250 mg | ORAL_TABLET | ORAL | Status: DC

## 2011-03-31 NOTE — Progress Notes (Signed)
  Subjective:    Patient ID: [name], male    DOB: 1945/10/04, 65 y.o.   MRN: [id number]  HPI [name] is a 65 year old male, who comes in today for follow-up of hypertension, COPD, and reflux esophagitis.  He hasn't been here in a couple years because of his cardiac problems and is followed in cardiology by Dr. Myrtis Ser.  He takes carvedilol 3.125 mg daily and Lasix 40 mg b.i.d., Altace 2.5 mg b.i.d., blood pressure today 94/60.  He takes Advair 500 -- 51-cup b.i.d. For COPD he is an ex-smoker.  He takes Axid 150 mg b.i.d. For reflux esophagitis.  Is also allergic to come in and.  Not taking any blood thinner, except one aspirin tablet daily.  He states he recently within the last 30 days went for his annual [place] evaluation.  He told them.  His hearing aids were not working, but they told him they were fine.  However, he can not  hear   Review of Systems General he is systems otherwise negative    Objective:   Physical Exam  Well-developed male, very hard of hearing despite his hearing aids.  HEENT negative.  Neck was supple.  No adenopathy.  Thyroid normal.  Lungs showed symmetrical decrease in breath sounds.  No wheezing.  Cardiac exam negative.  Abdominal exam negative.  Extremities no edema      Assessment & Plan:  COPD continue the Advair one p.o. T.i.d., ......... Flu shot given today.  Tetanus and Pneumovax were updated last year.  Reflux esophagitis.  Continue Axid 150 b.i.d.  Hypertension.  Continue above medications............ His blood pressure is 94/60, however, he says he is not lightheaded when he stands up.  Because of his history of cardiac disease.  Dr. Myrtis Ser.......,  May want to Keep his blood pressure on the low side.  Significant hearing loss despite the hearing aids recommend he go back for reevaluation

## 2011-03-31 NOTE — Patient Instructions (Signed)
Continue your current medications.  We will call you within a week with your blood reports and also send a copy to Dr. Myrtis Ser, your cardiologist.  Return to see me in one year or sooner if any problems

## 2011-04-05 ENCOUNTER — Other Ambulatory Visit: Payer: Federal, State, Local not specified - PPO | Admitting: *Deleted

## 2011-04-06 LAB — COMPREHENSIVE METABOLIC PANEL
BUN: 11
Calcium: 8.6
Creatinine, Ser: 0.71
Glucose, Bld: 147 — ABNORMAL HIGH
Total Protein: 6.1

## 2011-04-06 LAB — URINALYSIS, ROUTINE W REFLEX MICROSCOPIC
Ketones, ur: 15 — AB
Specific Gravity, Urine: 1.024
Urobilinogen, UA: 1
pH: 6

## 2011-04-06 LAB — PREPARE FRESH FROZEN PLASMA

## 2011-04-06 LAB — DIFFERENTIAL
Lymphocytes Relative: 4 — ABNORMAL LOW
Lymphs Abs: 0.5 — ABNORMAL LOW
Monocytes Absolute: 0.6
Monocytes Relative: 5
Neutro Abs: 10.7 — ABNORMAL HIGH

## 2011-04-06 LAB — HEMOGLOBIN A1C
Hgb A1c MFr Bld: 6.5 — ABNORMAL HIGH
Mean Plasma Glucose: 154

## 2011-04-06 LAB — BASIC METABOLIC PANEL
Calcium: 8.9
GFR calc Af Amer: >60
GFR calc non Af Amer: >60
Potassium: 4.4
Sodium: 138

## 2011-04-06 LAB — URINE MICROSCOPIC-ADD ON

## 2011-04-06 LAB — CBC
HCT: 50
Hemoglobin: 17.2 — ABNORMAL HIGH
RBC: 5.48

## 2011-04-06 LAB — PROTIME-INR: Prothrombin Time: 15.8 — ABNORMAL HIGH

## 2011-04-06 LAB — APTT: aPTT: 35

## 2011-04-06 LAB — ABO/RH: ABO/RH(D): O POS

## 2011-04-11 LAB — GLUCOSE, CAPILLARY
Glucose-Capillary: 107 — ABNORMAL HIGH
Glucose-Capillary: 108 — ABNORMAL HIGH
Glucose-Capillary: 112 — ABNORMAL HIGH
Glucose-Capillary: 120 — ABNORMAL HIGH
Glucose-Capillary: 125 — ABNORMAL HIGH
Glucose-Capillary: 135 — ABNORMAL HIGH
Glucose-Capillary: 139 — ABNORMAL HIGH
Glucose-Capillary: 145 — ABNORMAL HIGH
Glucose-Capillary: 146 — ABNORMAL HIGH
Glucose-Capillary: 162 — ABNORMAL HIGH
Glucose-Capillary: 185 — ABNORMAL HIGH
Glucose-Capillary: 189 — ABNORMAL HIGH
Glucose-Capillary: 198 — ABNORMAL HIGH
Glucose-Capillary: 96

## 2011-04-11 LAB — BASIC METABOLIC PANEL
BUN: 10
BUN: 11
BUN: 12
BUN: 13
BUN: 16
BUN: 9
CO2: 28
CO2: 29
CO2: 29
CO2: 33 — ABNORMAL HIGH
Calcium: 8.5
Calcium: 8.5
Calcium: 8.5
Calcium: 8.6
Calcium: 8.6
Calcium: 9
Chloride: 100
Chloride: 100
Chloride: 106
Chloride: 96
Chloride: 97
Chloride: 98
Creatinine, Ser: 0.62
Creatinine, Ser: 0.69
Creatinine, Ser: 0.74
Creatinine, Ser: 0.83
GFR calc Af Amer: >60
GFR calc Af Amer: >60
GFR calc Af Amer: >60
GFR calc Af Amer: >60
GFR calc Af Amer: >60
GFR calc non Af Amer: >60
GFR calc non Af Amer: >60
GFR calc non Af Amer: >60
Glucose, Bld: 123 — ABNORMAL HIGH
Glucose, Bld: 127 — ABNORMAL HIGH
Glucose, Bld: 136 — ABNORMAL HIGH
Glucose, Bld: 145 — ABNORMAL HIGH
Potassium: 3.9
Potassium: 4.2
Potassium: 4.5
Sodium: 134 — ABNORMAL LOW
Sodium: 138
Sodium: 139

## 2011-04-11 LAB — URINE MICROSCOPIC-ADD ON

## 2011-04-11 LAB — COMPREHENSIVE METABOLIC PANEL
Albumin: 3.4 — ABNORMAL LOW
BUN: 16
Chloride: 99
Creatinine, Ser: 0.79
Total Bilirubin: 1.6 — ABNORMAL HIGH

## 2011-04-11 LAB — POCT I-STAT, CHEM 8
Chloride: 100
HCT: 45
Hemoglobin: 15.3
Potassium: 3.5
Sodium: 138

## 2011-04-11 LAB — CBC
HCT: 43
MCHC: 33.6
MCHC: 34.4
MCV: 92.2
Platelets: 196
Platelets: 209
RBC: 4.29
RBC: 4.44
RDW: 14.9
RDW: 14.9
RDW: 15.2
WBC: 11.3 — ABNORMAL HIGH

## 2011-04-11 LAB — CK TOTAL AND CKMB (NOT AT ARMC)
CK, MB: 0.9
CK, MB: 19.6 — ABNORMAL HIGH
Total CK: 400 — ABNORMAL HIGH
Total CK: 43

## 2011-04-11 LAB — PROTIME-INR
INR: 1.1
Prothrombin Time: 14.9

## 2011-04-11 LAB — DIGOXIN LEVEL
Digoxin Level: 1
Digoxin Level: 1.4
Digoxin Level: 1.7

## 2011-04-11 LAB — LIPID PANEL
Cholesterol: 88
LDL Cholesterol: 39
Total CHOL/HDL Ratio: 3.5
Total CHOL/HDL Ratio: 3.5
Triglycerides: 71
VLDL: 14
VLDL: 17

## 2011-04-11 LAB — CARDIAC PANEL(CRET KIN+CKTOT+MB+TROPI)
CK, MB: 72.8 — ABNORMAL HIGH
CK, MB: 78.8 — ABNORMAL HIGH
Relative Index: 11.6 — ABNORMAL HIGH
Total CK: 679 — ABNORMAL HIGH
Total CK: 761 — ABNORMAL HIGH

## 2011-04-11 LAB — DIFFERENTIAL
Basophils Absolute: 0
Basophils Absolute: 0.1
Lymphocytes Relative: 12
Lymphocytes Relative: 8 — ABNORMAL LOW
Lymphs Abs: 0.9
Monocytes Absolute: 0.6
Neutro Abs: 9.3 — ABNORMAL HIGH
Neutro Abs: 9.7 — ABNORMAL HIGH
Neutrophils Relative %: 81 — ABNORMAL HIGH

## 2011-04-11 LAB — URINALYSIS, ROUTINE W REFLEX MICROSCOPIC
Glucose, UA: NEGATIVE
Ketones, ur: NEGATIVE
Protein, ur: 30 — AB

## 2011-04-11 LAB — CORTISOL: Cortisol, Plasma: 15.5

## 2011-04-11 LAB — APTT: aPTT: 38 — ABNORMAL HIGH

## 2011-04-11 LAB — HEMOGLOBIN A1C: Hgb A1c MFr Bld: 6.4 — ABNORMAL HIGH

## 2011-04-11 LAB — B-NATRIURETIC PEPTIDE (CONVERTED LAB): Pro B Natriuretic peptide (BNP): 269 — ABNORMAL HIGH

## 2011-04-11 LAB — MAGNESIUM: Magnesium: 2.1

## 2011-04-12 LAB — POCT CARDIAC MARKERS
CKMB, poc: <1 ng/mL — ABNORMAL LOW (ref 1.0–8.0)
Troponin i, poc: <0.05 ng/mL (ref 0.00–0.09)

## 2011-04-12 LAB — CBC
HCT: 33.8 % — ABNORMAL LOW (ref 39.0–52.0)
HCT: 34.5 % — ABNORMAL LOW (ref 39.0–52.0)
HCT: 35.1 % — ABNORMAL LOW (ref 39.0–52.0)
Hemoglobin: 11.4 g/dL — ABNORMAL LOW (ref 13.0–17.0)
Hemoglobin: 11.7 g/dL — ABNORMAL LOW (ref 13.0–17.0)
MCHC: 33.7 g/dL (ref 30.0–36.0)
MCV: 88.6 fL (ref 78.0–100.0)
MCV: 89.4 fL (ref 78.0–100.0)
MCV: 90.6 fL (ref 78.0–100.0)
Platelets: 195 10*3/uL (ref 150–400)
Platelets: 200 10*3/uL (ref 150–400)
RBC: 3.86 MIL/uL — ABNORMAL LOW (ref 4.22–5.81)
RDW: 16.6 % — ABNORMAL HIGH (ref 11.5–15.5)
WBC: 10.9 10*3/uL — ABNORMAL HIGH (ref 4.0–10.5)
WBC: 9.4 10*3/uL (ref 4.0–10.5)

## 2011-04-12 LAB — GLUCOSE, CAPILLARY
Glucose-Capillary: 129 mg/dL — ABNORMAL HIGH (ref 70–99)
Glucose-Capillary: 138 mg/dL — ABNORMAL HIGH (ref 70–99)
Glucose-Capillary: 164 mg/dL — ABNORMAL HIGH (ref 70–99)

## 2011-04-12 LAB — CARDIAC PANEL(CRET KIN+CKTOT+MB+TROPI)
CK, MB: 1 ng/mL (ref 0.3–4.0)
CK, MB: 1.1 ng/mL (ref 0.3–4.0)
Relative Index: INVALID (ref 0.0–2.5)
Relative Index: INVALID (ref 0.0–2.5)
Total CK: 34 U/L (ref 7–232)
Troponin I: 0.02 ng/mL (ref 0.00–0.06)

## 2011-04-12 LAB — URINALYSIS, ROUTINE W REFLEX MICROSCOPIC
Bilirubin Urine: NEGATIVE
Ketones, ur: NEGATIVE mg/dL
Nitrite: NEGATIVE
Specific Gravity, Urine: 1.014 (ref 1.005–1.030)
pH: 5.5 (ref 5.0–8.0)

## 2011-04-12 LAB — COMPREHENSIVE METABOLIC PANEL
Alkaline Phosphatase: 62 U/L (ref 39–117)
BUN: 13 mg/dL (ref 6–23)
Chloride: 96 mEq/L (ref 96–112)
Creatinine, Ser: 0.86 mg/dL (ref 0.4–1.5)
Glucose, Bld: 138 mg/dL — ABNORMAL HIGH (ref 70–99)
Potassium: 4.9 mEq/L (ref 3.5–5.1)
Total Bilirubin: 2.3 mg/dL — ABNORMAL HIGH (ref 0.3–1.2)
Total Protein: 6.3 g/dL (ref 6.0–8.3)

## 2011-04-12 LAB — BASIC METABOLIC PANEL
BUN: 11 mg/dL (ref 6–23)
BUN: 9 mg/dL (ref 6–23)
CO2: 33 mEq/L — ABNORMAL HIGH (ref 19–32)
Chloride: 98 mEq/L (ref 96–112)
Chloride: 99 mEq/L (ref 96–112)
Creatinine, Ser: 0.72 mg/dL (ref 0.4–1.5)
GFR calc non Af Amer: >60 mL/min (ref >60)
Potassium: 4.1 mEq/L (ref 3.5–5.1)
Potassium: 4.5 mEq/L (ref 3.5–5.1)
Sodium: 135 mEq/L (ref 135–145)

## 2011-04-12 LAB — DIFFERENTIAL
Basophils Absolute: 0.1 10*3/uL (ref 0.0–0.1)
Basophils Relative: 0 % (ref 0–1)
Lymphocytes Relative: 6 % — ABNORMAL LOW (ref 12–46)
Neutro Abs: 10.6 10*3/uL — ABNORMAL HIGH (ref 1.7–7.7)
Neutrophils Relative %: 88 % — ABNORMAL HIGH (ref 43–77)

## 2011-04-12 LAB — URINE MICROSCOPIC-ADD ON

## 2011-04-12 LAB — CULTURE, BLOOD (ROUTINE X 2)

## 2011-04-12 LAB — PROTIME-INR: INR: 1.3 (ref 0.00–1.49)

## 2011-04-12 LAB — B-NATRIURETIC PEPTIDE (CONVERTED LAB): Pro B Natriuretic peptide (BNP): 674 pg/mL — ABNORMAL HIGH (ref 0.0–100.0)

## 2011-04-12 LAB — APTT
aPTT: 40 seconds — ABNORMAL HIGH (ref 24–37)
aPTT: 43 seconds — ABNORMAL HIGH (ref 24–37)

## 2011-04-14 LAB — BASIC METABOLIC PANEL
BUN: 9 mg/dL (ref 6–23)
CO2: 28 mEq/L (ref 19–32)
CO2: 28 mEq/L (ref 19–32)
Calcium: 8.6 mg/dL (ref 8.4–10.5)
Chloride: 100 mEq/L (ref 96–112)
Chloride: 95 mEq/L — ABNORMAL LOW (ref 96–112)
Creatinine, Ser: 0.67 mg/dL (ref 0.4–1.5)
Creatinine, Ser: 0.84 mg/dL (ref 0.4–1.5)
GFR calc Af Amer: >60 mL/min (ref >60)
GFR calc Af Amer: >60 mL/min (ref >60)
GFR calc Af Amer: >60 mL/min (ref >60)
GFR calc non Af Amer: >60 mL/min (ref >60)
Glucose, Bld: 112 mg/dL — ABNORMAL HIGH (ref 70–99)
Glucose, Bld: 146 mg/dL — ABNORMAL HIGH (ref 70–99)
Potassium: 4.6 mEq/L (ref 3.5–5.1)
Sodium: 131 mEq/L — ABNORMAL LOW (ref 135–145)
Sodium: 137 mEq/L (ref 135–145)

## 2011-04-14 LAB — CULTURE, BLOOD (ROUTINE X 2): Culture: NO GROWTH

## 2011-04-14 LAB — CBC
HCT: 35.3 % — ABNORMAL LOW (ref 39.0–52.0)
Hemoglobin: 11.7 g/dL — ABNORMAL LOW (ref 13.0–17.0)
Hemoglobin: 11.7 g/dL — ABNORMAL LOW (ref 13.0–17.0)
MCHC: 33.2 g/dL (ref 30.0–36.0)
MCHC: 33.3 g/dL (ref 30.0–36.0)
MCHC: 33.5 g/dL (ref 30.0–36.0)
MCV: 89.4 fL (ref 78.0–100.0)
MCV: 90.9 fL (ref 78.0–100.0)
Platelets: 227 10*3/uL (ref 150–400)
RBC: 3.58 MIL/uL — ABNORMAL LOW (ref 4.22–5.81)
RBC: 3.84 MIL/uL — ABNORMAL LOW (ref 4.22–5.81)
RBC: 3.91 MIL/uL — ABNORMAL LOW (ref 4.22–5.81)
RBC: 3.92 MIL/uL — ABNORMAL LOW (ref 4.22–5.81)
RDW: 16.7 % — ABNORMAL HIGH (ref 11.5–15.5)
RDW: 18.2 % — ABNORMAL HIGH (ref 11.5–15.5)
WBC: 9.8 10*3/uL (ref 4.0–10.5)

## 2011-04-14 LAB — COMPREHENSIVE METABOLIC PANEL
Albumin: 2.7 g/dL — ABNORMAL LOW (ref 3.5–5.2)
Alkaline Phosphatase: 66 U/L (ref 39–117)
BUN: 14 mg/dL (ref 6–23)
Creatinine, Ser: 0.83 mg/dL (ref 0.4–1.5)
Glucose, Bld: 127 mg/dL — ABNORMAL HIGH (ref 70–99)
Potassium: 4.3 mEq/L (ref 3.5–5.1)
Total Bilirubin: 2.3 mg/dL — ABNORMAL HIGH (ref 0.3–1.2)
Total Protein: 6.3 g/dL (ref 6.0–8.3)

## 2011-04-14 LAB — PROTIME-INR: INR: 1.3 (ref 0.00–1.49)

## 2011-04-14 LAB — POCT I-STAT 3, ART BLOOD GAS (G3+)
Bicarbonate: 25.6 mEq/L — ABNORMAL HIGH (ref 20.0–24.0)
pCO2 arterial: 35.9 mmHg (ref 35.0–45.0)
pH, Arterial: 7.461 — ABNORMAL HIGH (ref 7.350–7.450)
pO2, Arterial: 56 mmHg — ABNORMAL LOW (ref 80.0–100.0)

## 2011-04-14 LAB — POCT I-STAT 3, VENOUS BLOOD GAS (G3P V)
Bicarbonate: 26.7 mEq/L — ABNORMAL HIGH (ref 20.0–24.0)
O2 Saturation: 66 %
TCO2: 28 mmol/L (ref 0–100)
pCO2, Ven: 39.1 mmHg — ABNORMAL LOW (ref 45.0–50.0)
pO2, Ven: 33 mmHg (ref 30.0–45.0)

## 2011-04-14 LAB — APTT: aPTT: 43 seconds — ABNORMAL HIGH (ref 24–37)

## 2011-05-21 ENCOUNTER — Other Ambulatory Visit: Payer: Self-pay | Admitting: Family Medicine

## 2011-06-09 ENCOUNTER — Ambulatory Visit (INDEPENDENT_AMBULATORY_CARE_PROVIDER_SITE_OTHER): Payer: Federal, State, Local not specified - PPO | Admitting: *Deleted

## 2011-06-09 DIAGNOSIS — I4891 Unspecified atrial fibrillation: Secondary | ICD-10-CM

## 2011-06-09 DIAGNOSIS — I428 Other cardiomyopathies: Secondary | ICD-10-CM

## 2011-06-10 ENCOUNTER — Other Ambulatory Visit: Payer: Self-pay | Admitting: Internal Medicine

## 2011-06-10 ENCOUNTER — Encounter: Payer: Self-pay | Admitting: Internal Medicine

## 2011-06-10 LAB — REMOTE ICD DEVICE
LV LEAD IMPEDENCE ICD: 1050 Ohm
RV LEAD IMPEDENCE ICD: 450 Ohm
TZAT-0001FASTVT: 1
TZAT-0004FASTVT: 8
TZAT-0004SLOWVT: 8
TZAT-0018SLOWVT: NEGATIVE
TZAT-0019FASTVT: 7.5 V
TZAT-0019SLOWVT: 7.5 V
TZAT-0020FASTVT: 1 ms
TZON-0003SLOWVT: 370 ms
TZON-0004FASTVT: 16
TZON-0004SLOWVT: 24
TZON-0005FASTVT: 6
TZON-0010FASTVT: 80 ms
TZON-0010SLOWVT: 80 ms
TZST-0001FASTVT: 4
TZST-0001SLOWVT: 2
TZST-0001SLOWVT: 3
TZST-0003FASTVT: 27.5 J
TZST-0003SLOWVT: 27.5 J
TZST-0003SLOWVT: 36 J
VENTRICULAR PACING ICD: 100 pct

## 2011-06-14 NOTE — Progress Notes (Signed)
Remote icd check  

## 2011-06-17 ENCOUNTER — Encounter: Payer: Self-pay | Admitting: *Deleted

## 2011-07-19 ENCOUNTER — Ambulatory Visit (INDEPENDENT_AMBULATORY_CARE_PROVIDER_SITE_OTHER): Payer: Federal, State, Local not specified - PPO | Admitting: Internal Medicine

## 2011-07-19 ENCOUNTER — Encounter: Payer: Self-pay | Admitting: Internal Medicine

## 2011-07-19 DIAGNOSIS — Z4502 Encounter for adjustment and management of automatic implantable cardiac defibrillator: Secondary | ICD-10-CM

## 2011-07-19 DIAGNOSIS — I2510 Atherosclerotic heart disease of native coronary artery without angina pectoris: Secondary | ICD-10-CM

## 2011-07-19 DIAGNOSIS — I428 Other cardiomyopathies: Secondary | ICD-10-CM

## 2011-07-19 DIAGNOSIS — I4891 Unspecified atrial fibrillation: Secondary | ICD-10-CM

## 2011-07-19 LAB — ICD DEVICE OBSERVATION
BATTERY VOLTAGE: 2.6922 V
DEVICE MODEL ICD: 471151
HV IMPEDENCE: 80 Ohm
LV LEAD IMPEDENCE ICD: 1012.5 Ohm
MODE SWITCH EPISODES: 0
RV LEAD AMPLITUDE: 11.7 mv
RV LEAD IMPEDENCE ICD: 450 Ohm
TOT-0007: 2
TOT-0008: 0
TOT-0009: 1
TOT-0010: 16
TZAT-0001FASTVT: 1
TZAT-0001SLOWVT: 1
TZAT-0004SLOWVT: 8
TZAT-0012SLOWVT: 200 ms
TZAT-0018SLOWVT: NEGATIVE
TZAT-0019FASTVT: 7.5 V
TZAT-0019SLOWVT: 7.5 V
TZAT-0020FASTVT: 1 ms
TZAT-0020SLOWVT: 1 ms
TZON-0003SLOWVT: 370 ms
TZON-0004FASTVT: 16
TZON-0004SLOWVT: 24
TZON-0005FASTVT: 6
TZON-0005SLOWVT: 6
TZON-0010FASTVT: 80 ms
TZST-0001FASTVT: 2
TZST-0001FASTVT: 4
TZST-0001FASTVT: 5
TZST-0001SLOWVT: 3
TZST-0001SLOWVT: 4
TZST-0003FASTVT: 27.5 J
TZST-0003FASTVT: 36 J
TZST-0003FASTVT: 36 J
TZST-0003SLOWVT: 17.5 J
TZST-0003SLOWVT: 27.5 J
VF: 0

## 2011-07-19 NOTE — Patient Instructions (Signed)
Your physician wants you to follow-up in: 12 months with Dr Taylor You will receive a reminder letter in the mail two months in advance. If you don't receive a letter, please call our office to schedule the follow-up appointment.   Remote monitoring is used to monitor your Pacemaker of ICD from home. This monitoring reduces the number of office visits required to check your device to one time per year. It allows us to keep an eye on the functioning of your device to ensure it is working properly. You are scheduled for a device check from home on 10/20/2011. You may send your transmission at any time that day. If you have a wireless device, the transmission will be sent automatically. After your physician reviews your transmission, you will receive a postcard with your next transmission date.   

## 2011-07-19 NOTE — Assessment & Plan Note (Signed)
His ventricular rate is well controlled. He will continue his current medical therapy. He is not an anticoagulation candidate secondary to a history of intracranial bleed.

## 2011-07-19 NOTE — Assessment & Plan Note (Signed)
He denies anginal symptoms. He will continue his current medical therapy. 

## 2011-07-19 NOTE — Assessment & Plan Note (Signed)
His device is working normally. We'll plan a recheck in several months. 

## 2011-07-19 NOTE — Progress Notes (Signed)
HPI [name] returns today for followup. He is a pleasant 66 year old man with a history of mitral valve replacement secondary to endocarditis, complete heart block after AV node ablation, atrial fibrillation with an uncontrolled response, status post BIV ICD implantation. The patient denies chest pain, shortness of breath, or peripheral edema. No recent ICD shocks. Allergies  Allergen Reactions  . Warfarin Sodium      Current Outpatient Prescriptions  Medication Sig Dispense Refill  . amoxicillin (AMOXIL) 500 MG capsule Take 500 mg by mouth. Before dental work       . aspirin (BUFPIRIN) 325 MG buffered tablet Take 325 mg by mouth daily.        . carvedilol (COREG) 3.125 MG tablet Take 1 tablet (3.125 mg total) by mouth 1 day or 1 dose.  100 tablet  3  . Fluticasone-Salmeterol (ADVAIR) 500-50 MCG/DOSE AEPB Inhale 1 puff into the lungs every 12 (twelve) hours.  60 each  11  . furosemide (LASIX) 40 MG tablet TAKE 1 TABLET BY MOUTH ONCE A DAY  90 tablet  3  . nitroGLYCERIN (NITROSTAT) 0.4 MG SL tablet Place 0.4 mg under the tongue every 5 (five) minutes as needed.        . nizatidine (AXID) 150 MG capsule TAKE ONE CAPSULE BY MOUTH TWICE A DAY  180 capsule  3  . ramipril (ALTACE) 2.5 MG capsule Take 1 capsule (2.5 mg total) by mouth 2 (two) times daily.  100 capsule  3     Past Medical History  Diagnosis Date  . CAD (coronary artery disease)     CABG was not done at that time mitral valve replacement done through lateral thoracotomy  . Hypertension   . CVA (cerebral infarction)     2009  . Endocarditis     Bacterial, 2009  . Colon polyps   . Dyslipidemia   . COPD (chronic obstructive pulmonary disease)   . GERD (gastroesophageal reflux disease)   . Atrial fibrillation     AV Node ablation January, 2010, for rapid atrial fib  . Cardiomyopathy     Ischemic  . Ejection fraction < 50%     EF 30%, echo, December, 2010, apex dyskinetic, moderate hypokinesis posterior wall and inferior  wall  . Atrial septal defect     Closed with surgery January, 2010  . Mitral regurgitation     Severe,Mitral valve replacement  . ICD (implantable cardiac defibrillator) battery depletion     LV dysfunction and pacer needed for AV node lesion  . Intracranial hemorrhage     Coumadin cannot be used because of the history of his bleed  . Renal artery stenosis     Mild by history  . Pulmonary hypertension     Moderate  . HLD (hyperlipidemia)     ROS:   All systems reviewed and negative except as noted in the HPI.   Past Surgical History  Procedure Date  . Mitral valve replacement     Tissue valve, January, 2010  / Echo, December, 2010, valve working well  . Cardiac defibrillator placement     St Jude, Remote  . Penile prosthesis implant   . Appendectomy   . Other surgical history     biospy of left neck mass  . Leep   . Thoracotomy     right, for mitral valve replacement  . Mitral valve replacement 07/17/2008     Family History  Problem Relation Age of Onset  . Stomach cancer Father   .  Stroke      family history     History   Social History  . Marital Status: Married    Spouse Name: N/A    Number of Children: N/A  . Years of Education: N/A   Occupational History  . Not on file.   Social History Main Topics  . Smoking status: Former Games developer  . Smokeless tobacco: Not on file   Comment: quit 2009  . Alcohol Use: No  . Drug Use: No  . Sexually Active: Not on file   Other Topics Concern  . Not on file   Social History Narrative  . No narrative on file     BP 120/68  Pulse 70  Ht 6\' 2"  (1.88 m)  Wt 96.072 kg (211 lb 12.8 oz)  BMI 27.19 kg/m2  Physical Exam:  Well appearing NAD HEENT: Unremarkable Neck:  No JVD, no thyromegally Lymphatics:  No adenopathy Back:  No CVA tenderness Lungs:  Clear with no wheezes, rales, or rhonchi. Well-healed ICD incision. HEART:  Regular rate rhythm, no murmurs, no rubs, no clicks Abd:  soft, positive bowel  sounds, no organomegally, no rebound, no guarding Ext:  2 plus pulses, no edema, no cyanosis, no clubbing Skin:  No rashes no nodules Neuro:  CN II through XII intact, motor grossly intact  DEVICE  Normal device function.  See PaceArt for details.   Assess/Plan:

## 2011-09-17 ENCOUNTER — Other Ambulatory Visit: Payer: Self-pay | Admitting: Family Medicine

## 2011-09-26 ENCOUNTER — Ambulatory Visit: Payer: Federal, State, Local not specified - PPO | Admitting: Cardiology

## 2011-10-20 ENCOUNTER — Encounter: Payer: Federal, State, Local not specified - PPO | Admitting: *Deleted

## 2011-11-08 ENCOUNTER — Ambulatory Visit (INDEPENDENT_AMBULATORY_CARE_PROVIDER_SITE_OTHER): Payer: Federal, State, Local not specified - PPO | Admitting: Cardiology

## 2011-11-08 ENCOUNTER — Encounter: Payer: Self-pay | Admitting: Cardiology

## 2011-11-08 VITALS — BP 94/40 | HR 73 | Ht 74.0 in | Wt 212.0 lb

## 2011-11-08 DIAGNOSIS — I2510 Atherosclerotic heart disease of native coronary artery without angina pectoris: Secondary | ICD-10-CM

## 2011-11-08 DIAGNOSIS — Z4502 Encounter for adjustment and management of automatic implantable cardiac defibrillator: Secondary | ICD-10-CM

## 2011-11-08 DIAGNOSIS — I428 Other cardiomyopathies: Secondary | ICD-10-CM

## 2011-11-08 DIAGNOSIS — I38 Endocarditis, valve unspecified: Secondary | ICD-10-CM

## 2011-11-08 DIAGNOSIS — I4891 Unspecified atrial fibrillation: Secondary | ICD-10-CM

## 2011-11-08 NOTE — Assessment & Plan Note (Signed)
Patient has atrophic. However he has an AV node ablation. His left atrial appendage was closed at the time of cardiac surgery. A maze procedure could not be done at that time. He cannot be anticoagulated because of a prior intracerebral bleed.

## 2011-11-08 NOTE — Assessment & Plan Note (Signed)
His ICD is needed for pacing for his AV nodal ablation and for backup for his LV dysfunction.

## 2011-11-08 NOTE — Assessment & Plan Note (Signed)
Historically he has LV dysfunction. I cannot push his medications as he walks around with a very low blood pressure.

## 2011-11-08 NOTE — Patient Instructions (Signed)
Your physician wants you to follow-up in:  6 months. You will receive a reminder letter in the mail two months in advance. If you don't receive a letter, please call our office to schedule the follow-up appointment.   

## 2011-11-08 NOTE — Progress Notes (Signed)
HPI  The patient continues to do remarkably well. His history is complex. Ultimately he received the tissue mitral valve in January, 2010. He has an ICD in. He had endocarditis in 2009. He has atrial fibrillation. He had an AV node ablation in 08/11/2002 atrial fib bradycardia could not be controlled. Unfortunately he can not use Coumadin because of a prior intracranial hemorrhage. He does have some coronary disease. However CABG could not be done at the time of his mitral valve replacement because was done through a lateral thoracotomy. Despite all these issues he looks great and he is fully active.  Allergies  Allergen Reactions  . Warfarin Sodium     Current Outpatient Prescriptions  Medication Sig Dispense Refill  . amoxicillin (AMOXIL) 500 MG capsule Take 500 mg by mouth. Before dental work       . aspirin (BUFPIRIN) 325 MG buffered tablet Take 325 mg by mouth daily.        . carvedilol (COREG) 3.125 MG tablet Take 1 tablet (3.125 mg total) by mouth 1 day or 1 dose.  100 tablet  3  . Fluticasone-Salmeterol (ADVAIR) 500-50 MCG/DOSE AEPB Inhale 1 puff into the lungs every 12 (twelve) hours.  60 each  11  . furosemide (LASIX) 40 MG tablet TAKE 1 TABLET BY MOUTH ONCE A DAY  90 tablet  3  . nitroGLYCERIN (NITROSTAT) 0.4 MG SL tablet Place 0.4 mg under the tongue every 5 (five) minutes as needed.        . nizatidine (AXID) 150 MG capsule TAKE ONE CAPSULE BY MOUTH TWICE A DAY  180 capsule  3  . ramipril (ALTACE) 2.5 MG capsule Take 2.5 mg by mouth daily.      Marland Kitchen DISCONTD: ramipril (ALTACE) 2.5 MG capsule Take 1 capsule (2.5 mg total) by mouth 2 (two) times daily.  100 capsule  3  . DISCONTD: carvedilol (COREG) 3.125 MG tablet TAKE 1 TABLET BY MOUTH TWICE A DAY  200 tablet  2    History   Social History  . Marital Status: Married    Spouse Name: N/A    Number of Children: N/A  . Years of Education: N/A   Occupational History  . Not on file.   Social History Main Topics  . Smoking  status: Former Games developer  . Smokeless tobacco: Not on file   Comment: quit 2009  . Alcohol Use: No  . Drug Use: No  . Sexually Active: Not on file   Other Topics Concern  . Not on file   Social History Narrative  . No narrative on file    Family History  Problem Relation Age of Onset  . Stomach cancer Father   . Stroke      family history    Past Medical History  Diagnosis Date  . CAD (coronary artery disease)     CABG was not done at that time mitral valve replacement done through lateral thoracotomy  . Hypertension   . CVA (cerebral infarction)     2009  . Endocarditis     Bacterial, 2009  . Colon polyps   . Dyslipidemia   . COPD (chronic obstructive pulmonary disease)   . GERD (gastroesophageal reflux disease)   . Atrial fibrillation     AV Node ablation January, 2010, for rapid atrial fib  . Cardiomyopathy     Ischemic  . Ejection fraction < 50%     EF 30%, echo, December, 2010, apex dyskinetic, moderate hypokinesis posterior wall and inferior  wall  . Atrial septal defect     Closed with surgery January, 2010  . Mitral regurgitation     Severe,Mitral valve replacement  . ICD (implantable cardiac defibrillator) battery depletion     LV dysfunction and pacer needed for AV node lesion  . Intracranial hemorrhage     Coumadin cannot be used because of the history of his bleed  . Renal artery stenosis     Mild by history  . Pulmonary hypertension     Moderate  . HLD (hyperlipidemia)     Past Surgical History  Procedure Date  . Mitral valve replacement     Tissue valve, January, 2010  / Echo, December, 2010, valve working well  . Cardiac defibrillator placement     St Jude, Remote  . Penile prosthesis implant   . Appendectomy   . Other surgical history     biospy of left neck mass  . Leep   . Thoracotomy     right, for mitral valve replacement  . Mitral valve replacement 07/17/2008    ROS  Patient denies fever, chills, headache, sweats, rash, change  in vision, change in hearing, chest pain, cough, nausea vomiting, urinary symptoms. All of the systems are reviewed and are negative.  PHYSICAL EXAM  Filed Vitals:   11/08/11 1602  BP: 94/40  Pulse: 73  Height: 6\' 2"  (1.88 m)  Weight: 212 lb (96.163 kg)   EKG is done today and reviewed by me. There is atrial fib with pacing.    ASSESSMENT & PLAN

## 2011-11-08 NOTE — Assessment & Plan Note (Signed)
He had bacterial endocarditis in the past. He's had no recurrent problems.

## 2011-11-08 NOTE — Assessment & Plan Note (Signed)
He has not had any symptomatic coronary disease over time. He does have some coronary disease that could not be bypassed at the time of his surgery because it was done through a lateral thoracotomy.

## 2011-11-10 ENCOUNTER — Encounter: Payer: Federal, State, Local not specified - PPO | Admitting: *Deleted

## 2011-11-15 ENCOUNTER — Encounter: Payer: Self-pay | Admitting: *Deleted

## 2011-12-01 ENCOUNTER — Encounter: Payer: Self-pay | Admitting: Internal Medicine

## 2011-12-01 ENCOUNTER — Ambulatory Visit (INDEPENDENT_AMBULATORY_CARE_PROVIDER_SITE_OTHER): Payer: Federal, State, Local not specified - PPO | Admitting: *Deleted

## 2011-12-01 DIAGNOSIS — Z4502 Encounter for adjustment and management of automatic implantable cardiac defibrillator: Secondary | ICD-10-CM

## 2011-12-01 DIAGNOSIS — I428 Other cardiomyopathies: Secondary | ICD-10-CM

## 2011-12-02 LAB — REMOTE ICD DEVICE
BATTERY VOLTAGE: 2.57 V
DEVICE MODEL ICD: 471151
HV IMPEDENCE: 72 Ohm
LV LEAD IMPEDENCE ICD: 950 Ohm
RV LEAD AMPLITUDE: 11.7 mv
RV LEAD IMPEDENCE ICD: 410 Ohm
TZAT-0004FASTVT: 8
TZAT-0004SLOWVT: 8
TZAT-0012FASTVT: 200 ms
TZAT-0012SLOWVT: 200 ms
TZAT-0013SLOWVT: 4
TZAT-0018FASTVT: NEGATIVE
TZAT-0018SLOWVT: NEGATIVE
TZAT-0019FASTVT: 7.5 V
TZAT-0020FASTVT: 1 ms
TZON-0003FASTVT: 300 ms
TZON-0003SLOWVT: 370 ms
TZON-0004FASTVT: 16
TZON-0005FASTVT: 6
TZON-0010FASTVT: 80 ms
TZST-0001FASTVT: 3
TZST-0001FASTVT: 4
TZST-0001SLOWVT: 3
TZST-0001SLOWVT: 5
TZST-0003FASTVT: 36 J
TZST-0003SLOWVT: 27.5 J
TZST-0003SLOWVT: 36 J

## 2011-12-12 ENCOUNTER — Encounter: Payer: Self-pay | Admitting: *Deleted

## 2011-12-22 IMAGING — CR DG CHEST 1V PORT
1 series · 1 of 1 positions shown · non-contrast
Comparison: 08/04/2008

CLINICAL DATA: Defibrillator fired today.  Chest pain with
weakness.

PORTABLE CHEST - 1 VIEW

[AP]
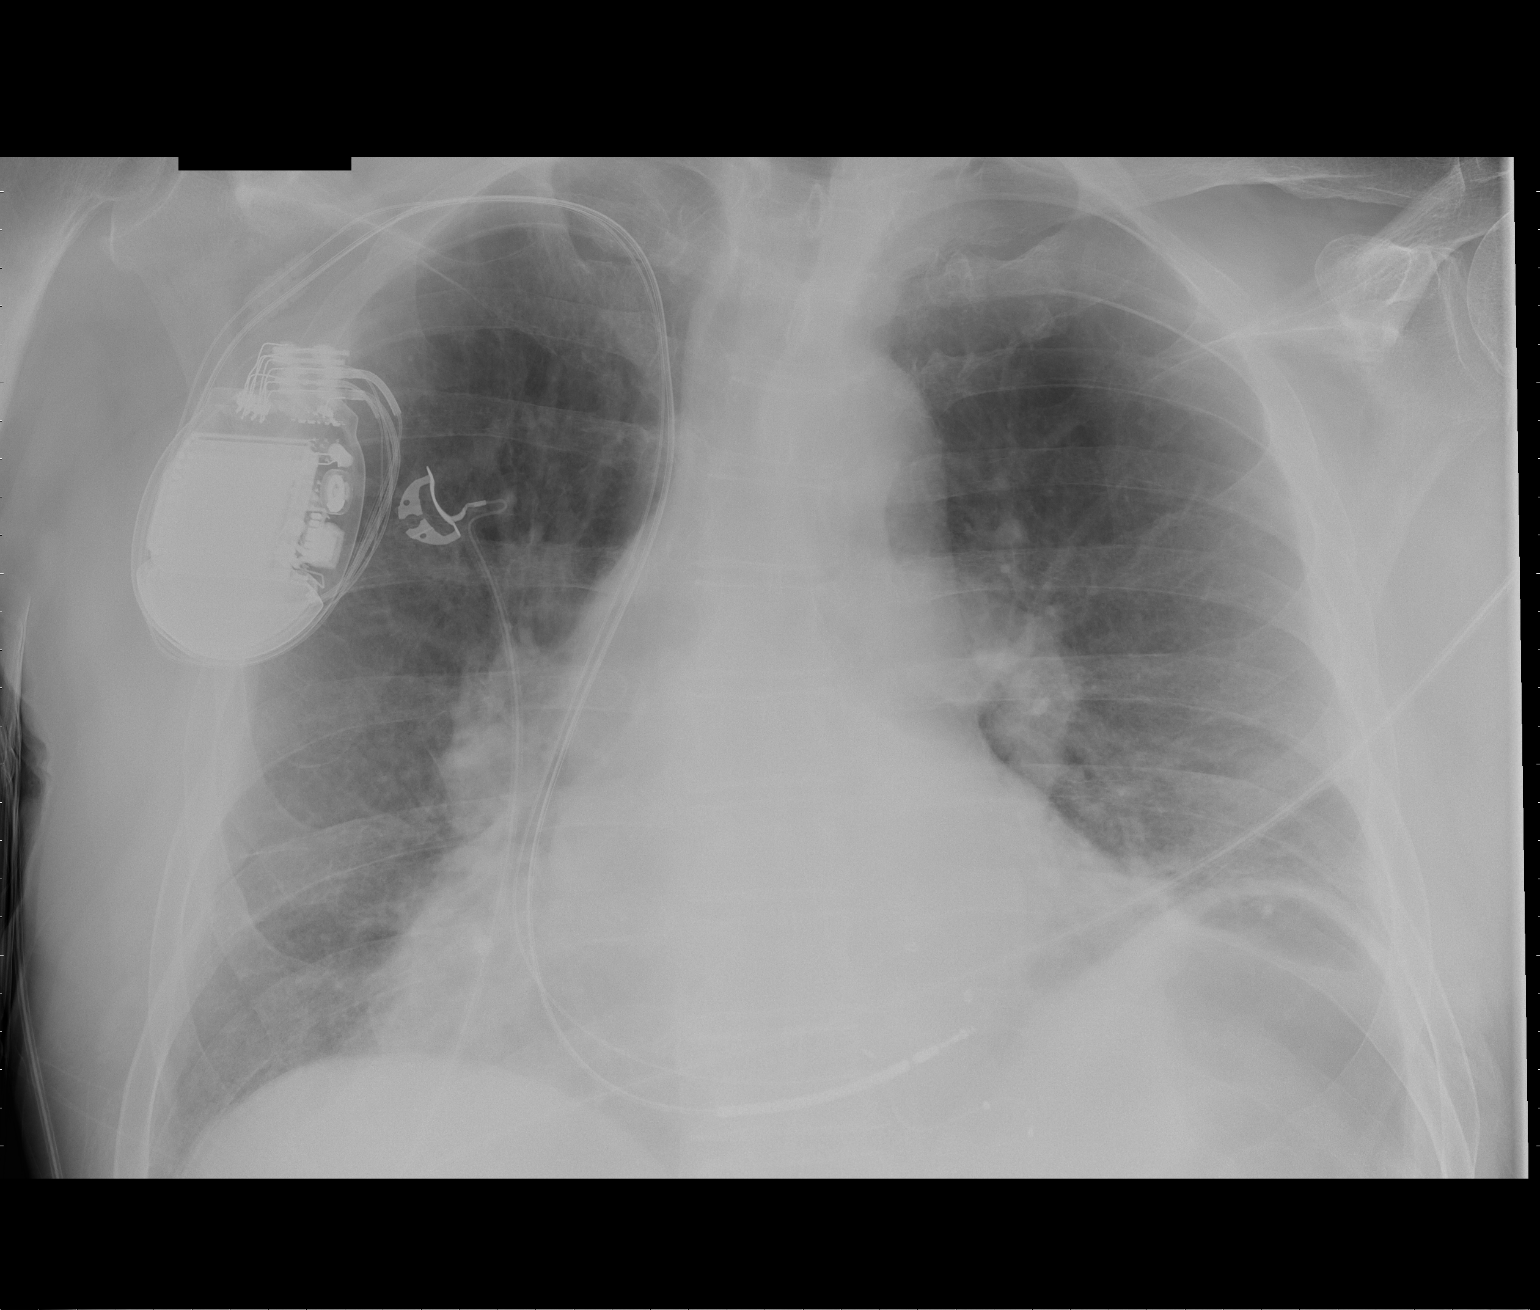

[1 of 1 positions shown; findings below may reference images not displayed]

FINDINGS: Cardiomegaly is present.  AICD device with leads entering
the heart from a right subclavian approach.  Low lung volumes are
present.  There is mild vascular congestion.  No definite failure
or infiltrates.  No pneumothorax is visible.  Worsening aeration
compared with priors.  Probable underlying COPD.
IMPRESSION: Low lung volumes.  Cardiomegaly with mild vascular congestion.
AICD.

## 2012-01-01 ENCOUNTER — Encounter: Payer: Self-pay | Admitting: Internal Medicine

## 2012-01-06 ENCOUNTER — Encounter: Payer: Self-pay | Admitting: Internal Medicine

## 2012-01-06 ENCOUNTER — Encounter: Payer: Self-pay | Admitting: *Deleted

## 2012-01-06 ENCOUNTER — Ambulatory Visit (INDEPENDENT_AMBULATORY_CARE_PROVIDER_SITE_OTHER): Payer: Federal, State, Local not specified - PPO | Admitting: Internal Medicine

## 2012-01-06 VITALS — BP 117/71 | HR 78 | Ht 74.0 in | Wt 215.1 lb

## 2012-01-06 DIAGNOSIS — I509 Heart failure, unspecified: Secondary | ICD-10-CM

## 2012-01-06 DIAGNOSIS — I4891 Unspecified atrial fibrillation: Secondary | ICD-10-CM

## 2012-01-06 DIAGNOSIS — Z4502 Encounter for adjustment and management of automatic implantable cardiac defibrillator: Secondary | ICD-10-CM

## 2012-01-06 DIAGNOSIS — I5022 Chronic systolic (congestive) heart failure: Secondary | ICD-10-CM

## 2012-01-06 LAB — ICD DEVICE OBSERVATION
AL IMPEDENCE ICD: 3187.5 Ohm
BATTERY VOLTAGE: 2.4064 V
DEVICE MODEL ICD: 471151
LV LEAD IMPEDENCE ICD: 1112.5 Ohm
MODE SWITCH EPISODES: 0
TOT-0006: 20100113000000
TOT-0007: 2
TOT-0010: 21
TZAT-0004SLOWVT: 8
TZAT-0012SLOWVT: 200 ms
TZAT-0013FASTVT: 2
TZAT-0013SLOWVT: 4
TZAT-0018FASTVT: NEGATIVE
TZAT-0019FASTVT: 7.5 V
TZAT-0019SLOWVT: 7.5 V
TZAT-0020FASTVT: 1 ms
TZAT-0020SLOWVT: 1 ms
TZON-0003FASTVT: 300 ms
TZON-0004SLOWVT: 24
TZON-0005FASTVT: 6
TZON-0005SLOWVT: 6
TZST-0001FASTVT: 2
TZST-0001SLOWVT: 3
TZST-0001SLOWVT: 4
TZST-0003FASTVT: 36 J
TZST-0003FASTVT: 36 J
TZST-0003SLOWVT: 27.5 J

## 2012-01-06 IMAGING — CT CT HEAD W/O CM
1 of 2 series · 13 of 30 positions shown, 17 images · non-contrast
Comparison: 07/01/2008 and earlier

CLINICAL DATA: Headache/right facial droop

CT HEAD WITHOUT CONTRAST
TECHNIQUE: Contiguous axial images were obtained from the base of
the skull through the vertex without contrast.

[Series 2: brain · axial · 0.49mm/px · z∈[+141,+275]mm · 13 of 32 slices shown, 17 images]
[im 3/32  brain]
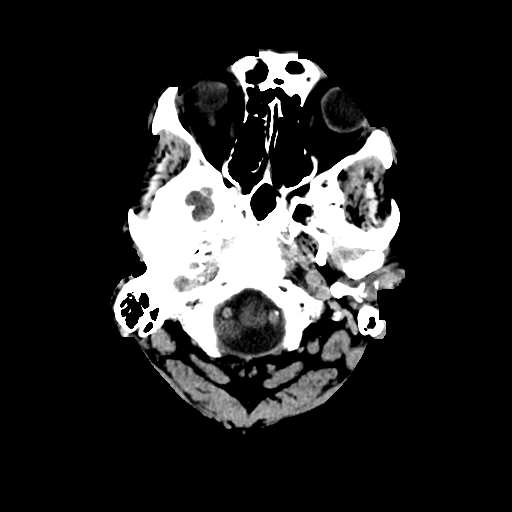
[im 3/32  bone]
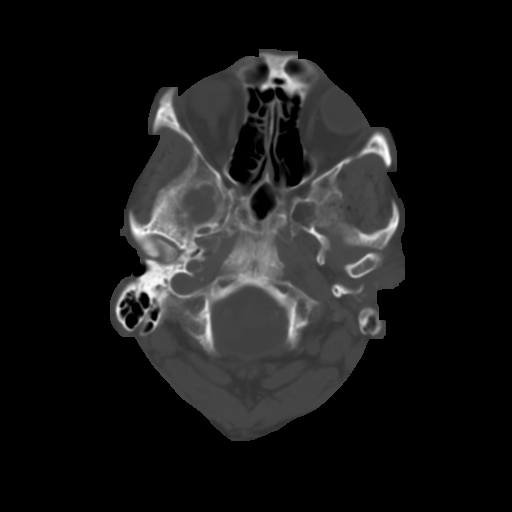
[im 5/32  brain]
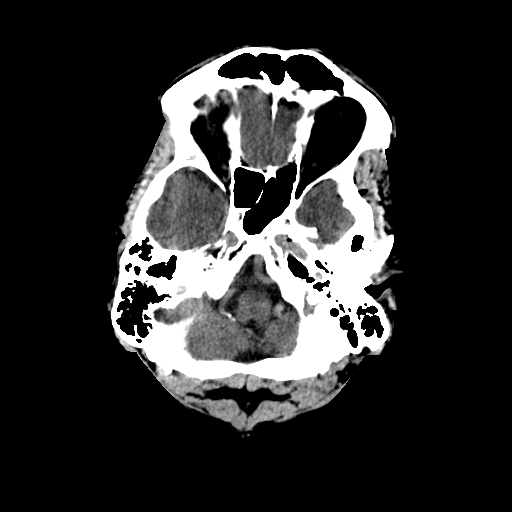
[im 7/32  brain]
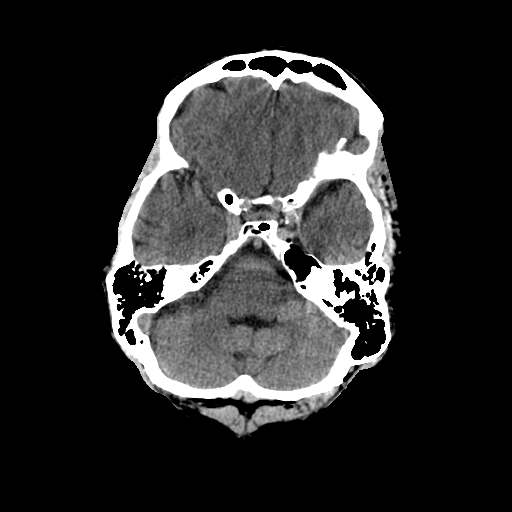
[im 9/32  brain]
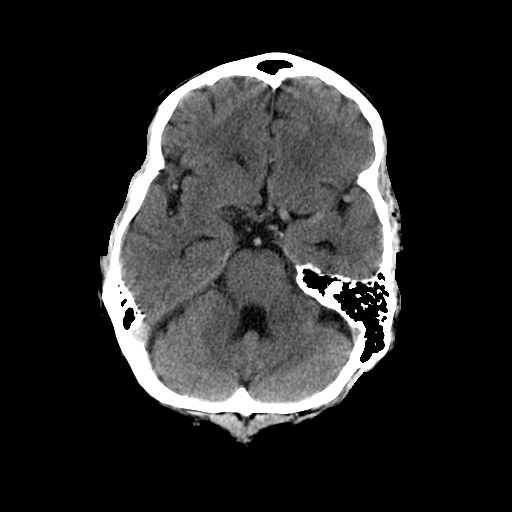
[im 12/32  brain]
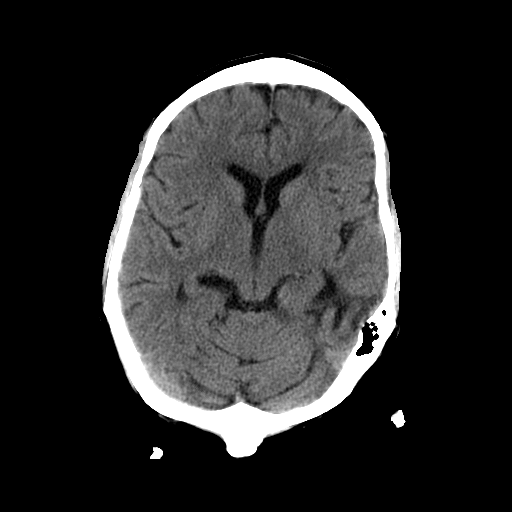
[im 12/32  bone]
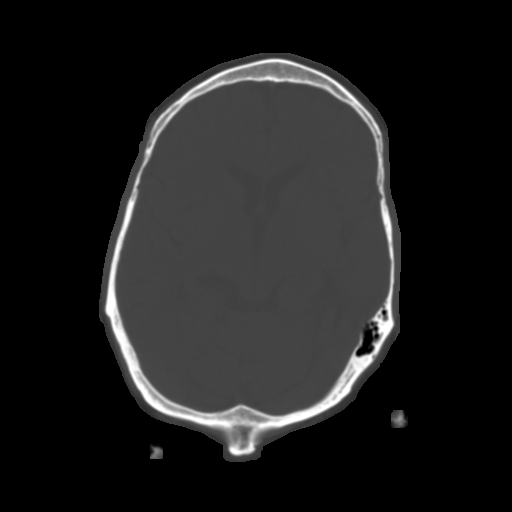
[im 14/32  brain]
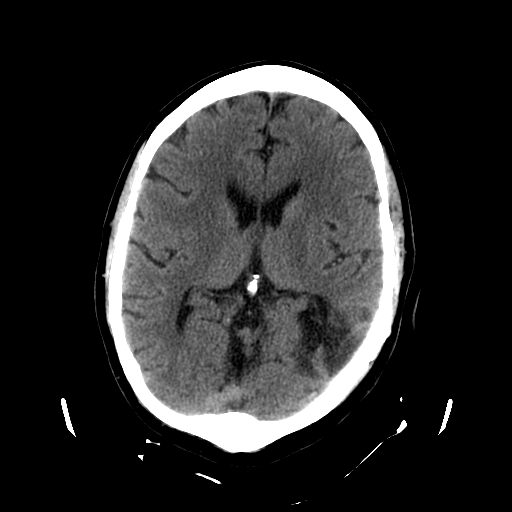
[im 16/32  brain]
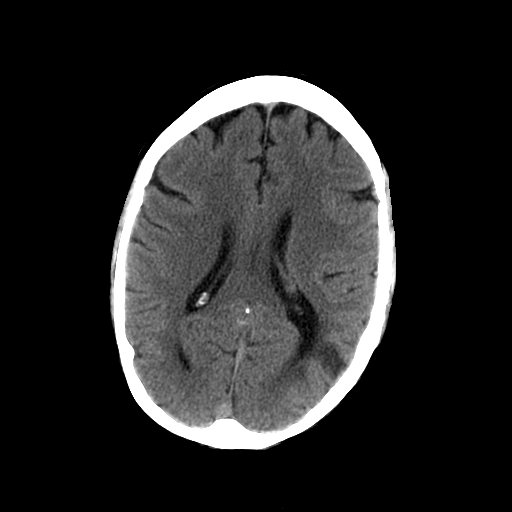
[im 18/32  brain]
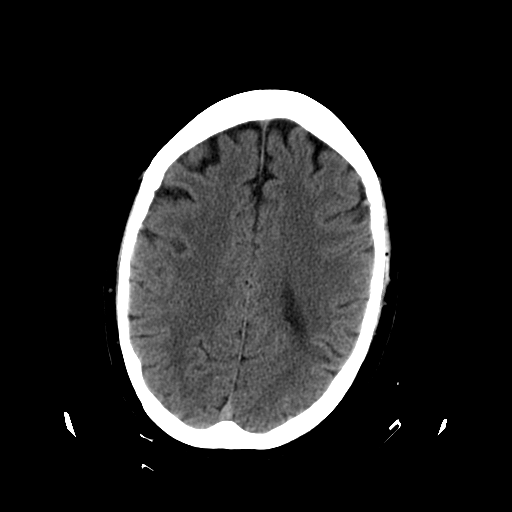
[im 20/32  brain]
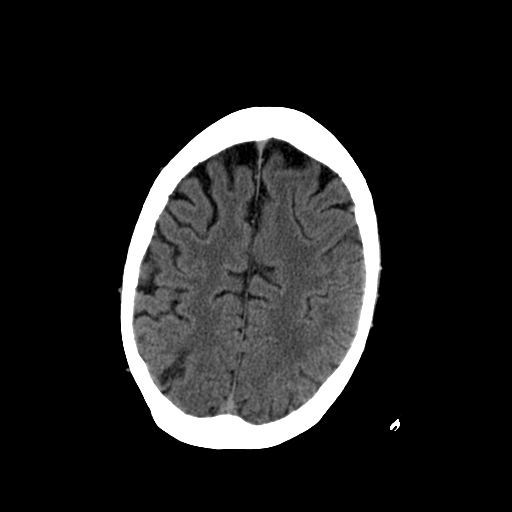
[im 20/32  bone]
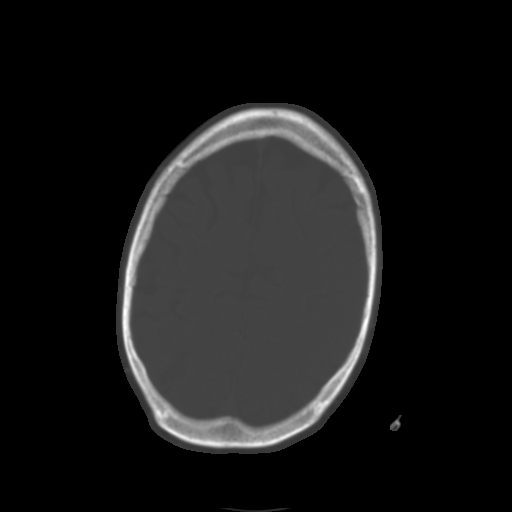
[im 23/32  brain]
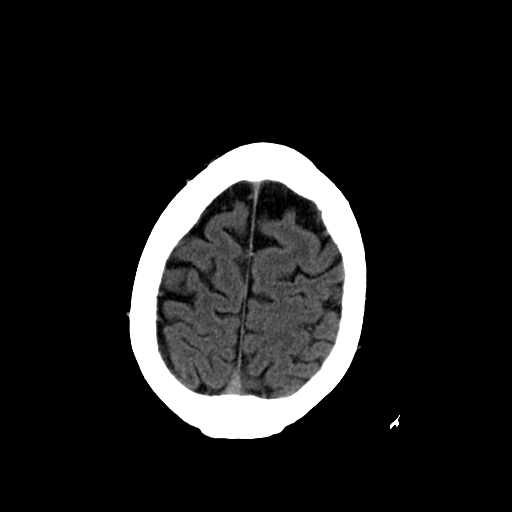
[im 25/32  brain]
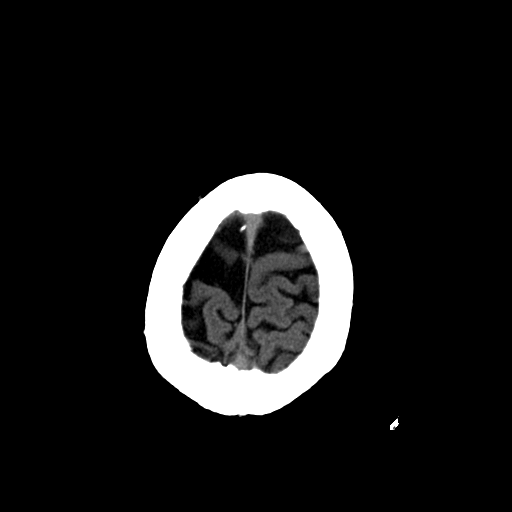
[im 27/32  brain]
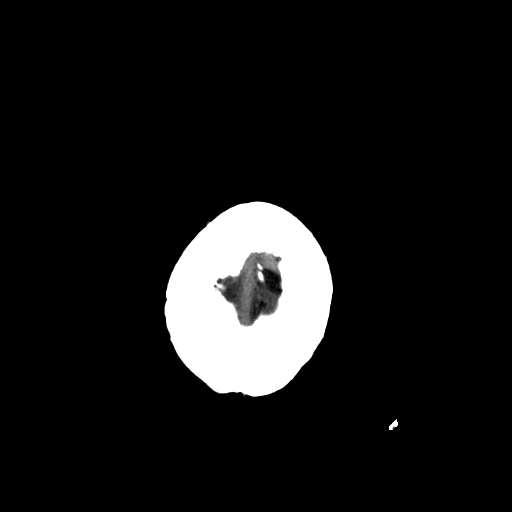
[im 29/32  brain]
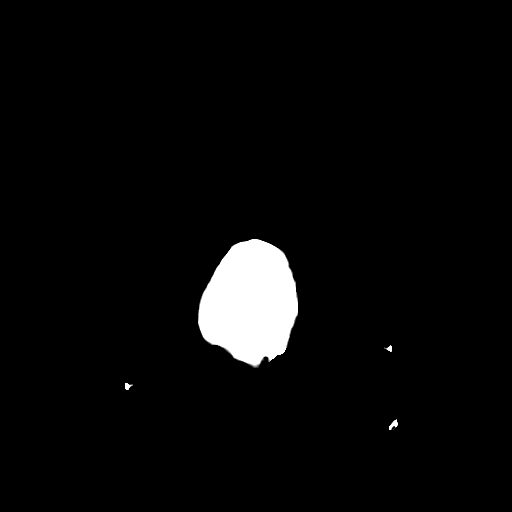
[im 29/32  bone]
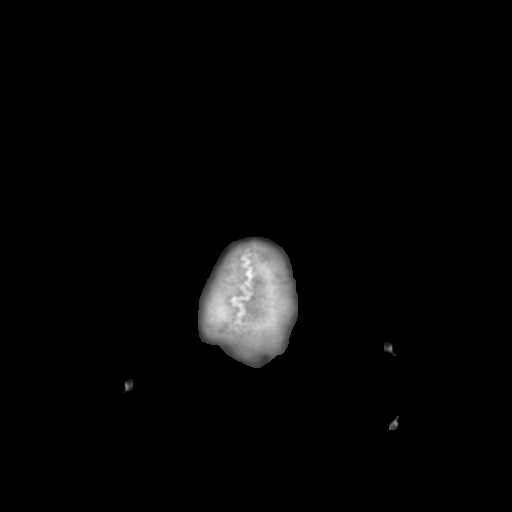

[13 of 30 positions shown; findings below may reference images not displayed]

FINDINGS: Again noted is an area of encephalomalacia in the left
temporal/parietal region due to an old infarct.  Also once again
noted is an old right parietal infarct.   No evidence for acute
infarct, hemorrhage, or mass lesion. No extra-axial fluid
collections or midline shift.  Calvarium intact.  No fluid in the
sinuses visualized.

Ventricular size and CSF spaces normal.

The left cavernous carotid artery is calcified and ectatic as
before.
IMPRESSION: 1.  Chronic bilateral ischemic changes as previously noted.
2.  No definite acute abnormality or significant interval change.

## 2012-01-08 ENCOUNTER — Encounter: Payer: Self-pay | Admitting: Internal Medicine

## 2012-01-08 DIAGNOSIS — I5022 Chronic systolic (congestive) heart failure: Secondary | ICD-10-CM | POA: Insufficient documentation

## 2012-01-08 NOTE — Progress Notes (Signed)
HPI [name] returns today for followup. He is a pleasant 66 yo man with a h/o ICM, mitral valve replacement, HTN, atrial fib s/p AV node ablation, s/p BIV ICD implant. The patient has done well in the interim but his ICD has reached ERI. He denies chest pain or sob. No palpitations, syncope, edema or recent ICD shock. Allergies  Allergen Reactions  . Warfarin Sodium      Current Outpatient Prescriptions  Medication Sig Dispense Refill  . amoxicillin (AMOXIL) 500 MG capsule Take 500 mg by mouth. Before dental work       . aspirin (BUFPIRIN) 325 MG buffered tablet Take 325 mg by mouth daily.        . carvedilol (COREG) 3.125 MG tablet Take 1 tablet (3.125 mg total) by mouth 1 day or 1 dose.  100 tablet  3  . Fluticasone-Salmeterol (ADVAIR) 500-50 MCG/DOSE AEPB Inhale 1 puff into the lungs every 12 (twelve) hours.  60 each  11  . furosemide (LASIX) 40 MG tablet TAKE 1 TABLET BY MOUTH ONCE A DAY  90 tablet  3  . nitroGLYCERIN (NITROSTAT) 0.4 MG SL tablet Place 0.4 mg under the tongue every 5 (five) minutes as needed.        . nizatidine (AXID) 150 MG capsule TAKE ONE CAPSULE BY MOUTH TWICE A DAY  180 capsule  3  . ramipril (ALTACE) 2.5 MG capsule Take 2.5 mg by mouth daily.         Past Medical History  Diagnosis Date  . CAD (coronary artery disease)     CABG was not done at that time mitral valve replacement done through lateral thoracotomy  . Hypertension   . CVA (cerebral infarction)     2009  . Endocarditis     Bacterial, 2009  . Colon polyps   . Dyslipidemia   . COPD (chronic obstructive pulmonary disease)   . GERD (gastroesophageal reflux disease)   . Atrial fibrillation     AV Node ablation January, 2010, for rapid atrial fib  . Cardiomyopathy     Ischemic  . Ejection fraction < 50%     EF 30%, echo, December, 2010, apex dyskinetic, moderate hypokinesis posterior wall and inferior wall  . Atrial septal defect     Closed with surgery January, 2010  . Mitral regurgitation      Severe,Mitral valve replacement  . ICD (implantable cardiac defibrillator) battery depletion     LV dysfunction and pacer needed for AV node lesion  . Intracranial hemorrhage     Coumadin cannot be used because of the history of his bleed  . Renal artery stenosis     Mild by history  . Pulmonary hypertension     Moderate  . HLD (hyperlipidemia)     ROS:   All systems reviewed and negative except as noted in the HPI.   Past Surgical History  Procedure Date  . Mitral valve replacement     Tissue valve, January, 2010  / Echo, December, 2010, valve working well  . Cardiac defibrillator placement     St Jude, Remote  . Penile prosthesis implant   . Appendectomy   . Other surgical history     biospy of left neck mass  . Leep   . Thoracotomy     right, for mitral valve replacement  . Mitral valve replacement 07/17/2008     Family History  Problem Relation Age of Onset  . Stomach cancer Father   . Stroke  family history     History   Social History  . Marital Status: Married    Spouse Name: N/A    Number of Children: N/A  . Years of Education: N/A   Occupational History  . Not on file.   Social History Main Topics  . Smoking status: Former Games developer  . Smokeless tobacco: Not on file   Comment: quit 2009  . Alcohol Use: No  . Drug Use: No  . Sexually Active: Not on file   Other Topics Concern  . Not on file   Social History Narrative  . No narrative on file     BP 117/71  Pulse 78  Ht 6\' 2"  (1.88 m)  Wt 215 lb 1.9 oz (97.578 kg)  BMI 27.62 kg/m2  Physical Exam:  Well appearing middle aged man, NAD HEENT: Unremarkable Neck:  No JVD, no thyromegally Lungs:  Clear with no wheezes, rales, or rhonchi. Well healed ICD incision. HEART:  Regular rate rhythm, no murmurs, no rubs, no clicks Abd:  soft, positive bowel sounds, no organomegally, no rebound, no guarding Ext:  2 plus pulses, no edema, no cyanosis, no clubbing Skin:  No rashes no  nodules Neuro:  CN II through XII intact, motor grossly intact  DEVICE  Normal device function.  See PaceArt for details. Device at [employer].  Assess/Plan:

## 2012-01-08 NOTE — Assessment & Plan Note (Signed)
His ventricular rate is well controlled. He will continue his current medical therapy. 

## 2012-01-08 NOTE — Assessment & Plan Note (Signed)
His BiV ICD is at elective replacement. Will schedule generator change.

## 2012-01-08 NOTE — Assessment & Plan Note (Signed)
His symptoms are class 2. He will continue his current meds. I have asked him to maintain a low sodium diet. 

## 2012-01-23 ENCOUNTER — Encounter (HOSPITAL_COMMUNITY): Payer: Self-pay | Admitting: Respiratory Therapy

## 2012-01-26 ENCOUNTER — Other Ambulatory Visit: Payer: Self-pay | Admitting: *Deleted

## 2012-01-26 DIAGNOSIS — I429 Cardiomyopathy, unspecified: Secondary | ICD-10-CM

## 2012-01-26 DIAGNOSIS — I4891 Unspecified atrial fibrillation: Secondary | ICD-10-CM

## 2012-01-26 DIAGNOSIS — I4892 Unspecified atrial flutter: Secondary | ICD-10-CM

## 2012-01-30 ENCOUNTER — Other Ambulatory Visit: Payer: Federal, State, Local not specified - PPO

## 2012-02-03 ENCOUNTER — Other Ambulatory Visit (INDEPENDENT_AMBULATORY_CARE_PROVIDER_SITE_OTHER): Payer: Federal, State, Local not specified - PPO

## 2012-02-03 DIAGNOSIS — E785 Hyperlipidemia, unspecified: Secondary | ICD-10-CM

## 2012-02-03 DIAGNOSIS — I429 Cardiomyopathy, unspecified: Secondary | ICD-10-CM

## 2012-02-03 DIAGNOSIS — I4891 Unspecified atrial fibrillation: Secondary | ICD-10-CM

## 2012-02-03 DIAGNOSIS — I428 Other cardiomyopathies: Secondary | ICD-10-CM

## 2012-02-03 LAB — HEPATIC FUNCTION PANEL
ALT: 10 U/L (ref 0–53)
AST: 16 U/L (ref 0–37)
Bilirubin, Direct: 0.2 mg/dL (ref 0.0–0.3)
Total Bilirubin: 1.6 mg/dL — ABNORMAL HIGH (ref 0.3–1.2)

## 2012-02-03 LAB — LIPID PANEL
LDL Cholesterol: 45 mg/dL (ref 0–99)
Total CHOL/HDL Ratio: 3
Triglycerides: 190 mg/dL — ABNORMAL HIGH (ref 0.0–149.0)
VLDL: 38 mg/dL (ref 0.0–40.0)

## 2012-02-03 LAB — BASIC METABOLIC PANEL
Chloride: 100 mEq/L (ref 96–112)
Creatinine, Ser: 1.1 mg/dL (ref 0.4–1.5)
GFR: 73.01 mL/min (ref >60.00)
Potassium: 4.5 mEq/L (ref 3.5–5.1)

## 2012-02-03 LAB — CBC WITH DIFFERENTIAL/PLATELET
Basophils Absolute: 0.1 10*3/uL (ref 0.0–0.1)
Eosinophils Absolute: 0.2 10*3/uL (ref 0.0–0.7)
Lymphocytes Relative: 11.5 % — ABNORMAL LOW (ref 12.0–46.0)
MCHC: 33.5 g/dL (ref 30.0–36.0)
Neutrophils Relative %: 77.7 % — ABNORMAL HIGH (ref 43.0–77.0)
RDW: 15 % — ABNORMAL HIGH (ref 11.5–14.6)

## 2012-02-04 ENCOUNTER — Other Ambulatory Visit: Payer: Self-pay | Admitting: Family Medicine

## 2012-02-05 MED ORDER — CEFAZOLIN SODIUM-DEXTROSE 2-3 GM-% IV SOLR
2.0000 g | INTRAVENOUS | Status: DC
  Filled 2012-02-05 (×2): qty 50

## 2012-02-05 MED ORDER — SODIUM CHLORIDE 0.9 % IR SOLN
80.0000 mg | Status: DC
  Filled 2012-02-05 (×2): qty 2

## 2012-02-06 ENCOUNTER — Ambulatory Visit (HOSPITAL_COMMUNITY): Payer: Federal, State, Local not specified - PPO

## 2012-02-06 ENCOUNTER — Ambulatory Visit (HOSPITAL_COMMUNITY)
Admission: RE | Admit: 2012-02-06 | Discharge: 2012-02-06 | Disposition: A | Discharge status: Home / Self Care | Payer: Federal, State, Local not specified - PPO | Source: Ambulatory Visit | Attending: Internal Medicine | Admitting: Internal Medicine

## 2012-02-06 ENCOUNTER — Encounter (HOSPITAL_COMMUNITY)
Admission: RE | Disposition: A | Discharge status: Home / Self Care | Payer: Self-pay | Source: Ambulatory Visit | Attending: Internal Medicine

## 2012-02-06 DIAGNOSIS — I4891 Unspecified atrial fibrillation: Secondary | ICD-10-CM | POA: Insufficient documentation

## 2012-02-06 DIAGNOSIS — I429 Cardiomyopathy, unspecified: Secondary | ICD-10-CM

## 2012-02-06 DIAGNOSIS — K219 Gastro-esophageal reflux disease without esophagitis: Secondary | ICD-10-CM | POA: Insufficient documentation

## 2012-02-06 DIAGNOSIS — I2510 Atherosclerotic heart disease of native coronary artery without angina pectoris: Secondary | ICD-10-CM | POA: Insufficient documentation

## 2012-02-06 DIAGNOSIS — Z954 Presence of other heart-valve replacement: Secondary | ICD-10-CM | POA: Insufficient documentation

## 2012-02-06 DIAGNOSIS — I2589 Other forms of chronic ischemic heart disease: Secondary | ICD-10-CM | POA: Insufficient documentation

## 2012-02-06 DIAGNOSIS — Z4502 Encounter for adjustment and management of automatic implantable cardiac defibrillator: Secondary | ICD-10-CM | POA: Insufficient documentation

## 2012-02-06 DIAGNOSIS — I10 Essential (primary) hypertension: Secondary | ICD-10-CM | POA: Insufficient documentation

## 2012-02-06 HISTORY — PX: IMPLANTABLE CARDIOVERTER DEFIBRILLATOR (ICD) GENERATOR CHANGE: SHX5469

## 2012-02-06 LAB — SURGICAL PCR SCREEN: MRSA, PCR: NEGATIVE

## 2012-02-06 SURGERY — ICD GENERATOR CHANGE
Anesthesia: LOCAL

## 2012-02-06 MED ORDER — SODIUM CHLORIDE 0.9 % IV SOLN: 250.0000 mL | INTRAVENOUS | Status: DC

## 2012-02-06 MED ORDER — LIDOCAINE HCL (PF) 1 % IJ SOLN
INTRAMUSCULAR | Status: AC
  Filled 2012-02-06: qty 60

## 2012-02-06 MED ORDER — MUPIROCIN 2 % EX OINT
TOPICAL_OINTMENT | Freq: Two times a day (BID) | CUTANEOUS | Status: DC
  Administered 2012-02-06: 1 via NASAL

## 2012-02-06 MED ORDER — SODIUM CHLORIDE 0.9 % IJ SOLN: 3.0000 mL | INTRAMUSCULAR | Status: DC | PRN

## 2012-02-06 MED ORDER — MUPIROCIN 2 % EX OINT
TOPICAL_OINTMENT | CUTANEOUS | Status: AC
  Filled 2012-02-06: qty 22

## 2012-02-06 MED ORDER — MIDAZOLAM HCL 5 MG/5ML IJ SOLN
INTRAMUSCULAR | Status: AC
  Filled 2012-02-06: qty 5

## 2012-02-06 MED ORDER — FENTANYL CITRATE 0.05 MG/ML IJ SOLN
INTRAMUSCULAR | Status: AC
  Filled 2012-02-06: qty 2

## 2012-02-06 MED ORDER — CHLORHEXIDINE GLUCONATE 4 % EX LIQD: 60.0000 mL | Freq: Once | CUTANEOUS | Status: DC

## 2012-02-06 MED ORDER — SODIUM CHLORIDE 0.9 % IJ SOLN: 3.0000 mL | Freq: Two times a day (BID) | INTRAMUSCULAR | Status: DC

## 2012-02-06 NOTE — Op Note (Signed)
ICD generator removal and insertion of a new ICD without immediate complication. [id number].

## 2012-02-06 NOTE — H&P (Addendum)
HPI  [name] returns today for ICD generator removal and insertion of a new device. He is a pleasant 66 yo man with a h/o ICM, mitral valve replacement, HTN, atrial fib s/p AV node ablation, s/p BIV ICD implant. The patient has done well in the interim but his ICD has reached ERI. He denies chest pain or sob. No palpitations, syncope, edema or recent ICD shock.  Allergies   Allergen  Reactions   .  Warfarin Sodium     Current Outpatient Prescriptions   Medication  Sig  Dispense  Refill   .  amoxicillin (AMOXIL) 500 MG capsule  Take 500 mg by mouth. Before dental work     .  aspirin (BUFPIRIN) 325 MG buffered tablet  Take 325 mg by mouth daily.     .  carvedilol (COREG) 3.125 MG tablet  Take 1 tablet (3.125 mg total) by mouth 1 day or 1 dose.  100 tablet  3   .  Fluticasone-Salmeterol (ADVAIR) 500-50 MCG/DOSE AEPB  Inhale 1 puff into the lungs every 12 (twelve) hours.  60 each  11   .  furosemide (LASIX) 40 MG tablet  TAKE 1 TABLET BY MOUTH ONCE A DAY  90 tablet  3   .  nitroGLYCERIN (NITROSTAT) 0.4 MG SL tablet  Place 0.4 mg under the tongue every 5 (five) minutes as needed.     .  nizatidine (AXID) 150 MG capsule  TAKE ONE CAPSULE BY MOUTH TWICE A DAY  180 capsule  3   .  ramipril (ALTACE) 2.5 MG capsule  Take 2.5 mg by mouth daily.      Past Medical History   Diagnosis  Date   .  CAD (coronary artery disease)      CABG was not done at that time mitral valve replacement done through lateral thoracotomy   .  Hypertension    .  CVA (cerebral infarction)      2009   .  Endocarditis      Bacterial, 2009   .  Colon polyps    .  Dyslipidemia    .  COPD (chronic obstructive pulmonary disease)    .  GERD (gastroesophageal reflux disease)    .  Atrial fibrillation      AV Node ablation January, 2010, for rapid atrial fib   .  Cardiomyopathy      Ischemic   .  Ejection fraction < 50%      EF 30%, echo, December, 2010, apex dyskinetic, moderate hypokinesis posterior wall and inferior wall    .  Atrial septal defect      Closed with surgery January, 2010   .  Mitral regurgitation      Severe,Mitral valve replacement   .  ICD (implantable cardiac defibrillator) battery depletion      LV dysfunction and pacer needed for AV node lesion   .  Intracranial hemorrhage      Coumadin cannot be used because of the history of his bleed   .  Renal artery stenosis      Mild by history   .  Pulmonary hypertension      Moderate   .  HLD (hyperlipidemia)     ROS:  All systems reviewed and negative except as noted in the HPI.  Past Surgical History   Procedure  Date   .  Mitral valve replacement      Tissue valve, January, 2010 / Echo, December, 2010, valve working well   .  Cardiac defibrillator placement      St Jude, Remote   .  Penile prosthesis implant    .  Appendectomy    .  Other surgical history      biospy of left neck mass   .  Leep    .  Thoracotomy      right, for mitral valve replacement   .  Mitral valve replacement  07/17/2008    Family History   Problem  Relation  Age of Onset   .  Stomach cancer  Father    .  Stroke        family history    History    Social History   .  Marital Status:  Married     Spouse Name:  N/A     Number of Children:  N/A   .  Years of Education:  N/A    Occupational History   .  Not on file.    Social History Main Topics   .  Smoking status:  Former Games developer   .  Smokeless tobacco:  Not on file     Comment: quit 2009    .  Alcohol Use:  No   .  Drug Use:  No   .  Sexually Active:  Not on file    Other Topics  Concern   .  Not on file    Social History Narrative   .  No narrative on file    BP 117/71  Pulse 78  Ht 6\' 2"  (1.88 m)  Wt 215 lb 1.9 oz (97.578 kg)  BMI 27.62 kg/m2  Physical Exam:  Well appearing middle aged man, NAD  HEENT: Unremarkable  Neck: No JVD, no thyromegally  Lungs: Clear with no wheezes, rales, or rhonchi. Well healed ICD incision.  HEART: Regular rate rhythm, no murmurs, no rubs, no  clicks  Abd: soft, positive bowel sounds, no organomegally, no rebound, no guarding  Ext: 2 plus pulses, no edema, no cyanosis, no clubbing  Skin: No rashes no nodules  Neuro: CN II through XII intact, motor grossly intact  DEVICE  Normal device function. See PaceArt for details. Device at [employer].  Assess/Plan:   ICD (implantable cardiac defibrillator) battery depletion - Lewayne Bunting, MD 01/08/2012 10:23 PM Signed  His BiV ICD is at elective replacement. Will schedule generator change. I have discussed the risks/benefits/goals/expectations of ICD generator change out and he wishes to proceed. Atrial fibrillation - Lewayne Bunting, MD 01/08/2012 10:23 PM Signed  His ventricular rate is well controlled. He will continue his current medical therapy. Chronic systolic CHF (congestive heart failure) - Lewayne Bunting, MD 01/08/2012 10:25 PM Signed  His symptoms are class 2. He will continue his current meds. I have asked him to maintain a low sodium diet.  Lewayne Bunting, M.D.

## 2012-02-06 NOTE — Op Note (Signed)
NAME:  [name], YEHLE NO.:  [id number]  MEDICAL RECORD NO.:  [id number]  LOCATION:  [facility]                         FACILITY:  [facility]  PHYSICIAN:  Doylene Canning. Ladona Ridgel, MD    DATE OF BIRTH:  May 30, 1946  DATE OF PROCEDURE:  02/06/2012 DATE OF DISCHARGE:  02/06/2012                              OPERATIVE REPORT   PROCEDURE PERFORMED:  Removal of previously implanted biventricular ICD, which had reached elective replacement prematurely and insertion of a new biventricular ICD.  INTRODUCTION:  The patient is a very pleasant 66 year old man with ischemic as well as nonischemic cardiomyopathy, chronic atrial fibrillation status post AV node ablation and severe left ventricular dysfunction, status post BiV ICD insertion.  His device reached elective replacement prematurely and he is now referred for insertion of a new device and removal of the old one.  PROCEDURE:  After informed consent was obtained, the patient was taken to the diagnostic EP lab in a fasting state.  After usual preparation and draping, intravenous fentanyl and midazolam was given for sedation. 30 mL of lidocaine was infiltrated into the right infraclavicular region.  A 6 cm incision was carried out over this region. Electrocautery was utilized to dissect down to the ICD pocket. Electrocautery was utilized to free up the dense fibrous adhesions.  The device was removed with gentle traction.  The leads were evaluated and found to be working satisfactorily.  The patient had an underlying escape rhythm at 30 beats per minute.  The new St. Jude [employer] BiV ICD serial number [id number] was connected to the old RV and LV lead and placed back in the subcutaneous pocket.  The pocket was irrigated with antibiotic irrigation and the incision was closed with 2-0 and 3-0 Vicryl.  Benzoin and Steri-Strips were painted on the skin, pressure dressing was applied, and the patient was returned to his room in  a satisfactory condition.  COMPLICATIONS:  There were no immediate procedure complications.  RESULTS:  This demonstrates successful removal of a previously implanted BiV ICD followed by insertion of a new device without immediate procedure complication.  Defibrillation threshold testing was not carried out because of the patient's chronic atrial fibrillation and inability to take anticoagulation.     Doylene Canning. Ladona Ridgel, MD     GWT/MEDQ  D:  02/06/2012  T:  02/06/2012  Job:  [id number]

## 2012-02-13 ENCOUNTER — Telehealth: Payer: Self-pay | Admitting: Internal Medicine

## 2012-02-13 NOTE — Telephone Encounter (Signed)
Spoke w/pt's wife in regards to [employer]. New merlin ordered from change out. Pt to bring old Merlin to appt on 02-15-12.

## 2012-02-13 NOTE — Telephone Encounter (Signed)
Please return call to patient wife [phone], regarding questions about remote monitor.

## 2012-02-15 ENCOUNTER — Encounter: Payer: Self-pay | Admitting: Internal Medicine

## 2012-02-15 ENCOUNTER — Ambulatory Visit (INDEPENDENT_AMBULATORY_CARE_PROVIDER_SITE_OTHER): Payer: Federal, State, Local not specified - PPO | Admitting: *Deleted

## 2012-02-15 DIAGNOSIS — I428 Other cardiomyopathies: Secondary | ICD-10-CM

## 2012-02-15 DIAGNOSIS — I4891 Unspecified atrial fibrillation: Secondary | ICD-10-CM

## 2012-02-15 LAB — ICD DEVICE OBSERVATION
CHARGE TIME: 8.5 s
DEV-0020ICD: NEGATIVE
HV IMPEDENCE: 68 Ohm
MODE SWITCH EPISODES: 0
TOT-0007: 0
TOT-0008: 0
TZAT-0001FASTVT: 1
TZAT-0001SLOWVT: 1
TZAT-0012SLOWVT: 200 ms
TZAT-0018SLOWVT: NEGATIVE
TZAT-0019FASTVT: 7.5 V
TZAT-0019SLOWVT: 7.5 V
TZAT-0020FASTVT: 1 ms
TZAT-0020SLOWVT: 1 ms
TZON-0003FASTVT: 300 ms
TZON-0003SLOWVT: 370 ms
TZON-0004FASTVT: 30
TZON-0004SLOWVT: 40
TZON-0005FASTVT: 6
TZST-0001FASTVT: 4
TZST-0001SLOWVT: 4
TZST-0003FASTVT: 27.5 J
TZST-0003SLOWVT: 17.5 J
TZST-0003SLOWVT: 30 J
VENTRICULAR PACING ICD: 99.75 pct

## 2012-02-15 NOTE — Progress Notes (Signed)
Wound check defib in clinic  

## 2012-03-27 ENCOUNTER — Other Ambulatory Visit (INDEPENDENT_AMBULATORY_CARE_PROVIDER_SITE_OTHER): Payer: Federal, State, Local not specified - PPO

## 2012-03-27 DIAGNOSIS — Z0000 Encounter for general adult medical examination without abnormal findings: Secondary | ICD-10-CM

## 2012-03-27 LAB — CBC WITH DIFFERENTIAL/PLATELET
Basophils Relative: 0.5 % (ref 0.0–3.0)
Eosinophils Relative: 1.6 % (ref 0.0–5.0)
HCT: 49.7 % (ref 39.0–52.0)
Hemoglobin: 16.2 g/dL (ref 13.0–17.0)
Lymphs Abs: 0.9 10*3/uL (ref 0.7–4.0)
MCV: 92.8 fl (ref 78.0–100.0)
Monocytes Absolute: 0.6 10*3/uL (ref 0.1–1.0)
Monocytes Relative: 7.6 % (ref 3.0–12.0)
RBC: 5.35 Mil/uL (ref 4.22–5.81)
WBC: 7.5 10*3/uL (ref 4.5–10.5)

## 2012-03-27 LAB — POCT URINALYSIS DIPSTICK
Bilirubin, UA: NEGATIVE
Blood, UA: NEGATIVE
Glucose, UA: NEGATIVE
Nitrite, UA: NEGATIVE
Spec Grav, UA: 1.01
pH, UA: 6

## 2012-03-27 LAB — LIPID PANEL
HDL: 35.7 mg/dL — ABNORMAL LOW (ref >39.00)
LDL Cholesterol: 69 mg/dL (ref 0–99)
Total CHOL/HDL Ratio: 3
Triglycerides: 97 mg/dL (ref 0.0–149.0)

## 2012-03-27 LAB — BASIC METABOLIC PANEL
Chloride: 97 mEq/L (ref 96–112)
GFR: 77.95 mL/min (ref >60.00)
Potassium: 5.3 mEq/L — ABNORMAL HIGH (ref 3.5–5.1)
Sodium: 141 mEq/L (ref 135–145)

## 2012-03-27 LAB — HEPATIC FUNCTION PANEL
ALT: 10 U/L (ref 0–53)
AST: 15 U/L (ref 0–37)
Bilirubin, Direct: 0.3 mg/dL (ref 0.0–0.3)
Total Protein: 6.9 g/dL (ref 6.0–8.3)

## 2012-03-27 LAB — TSH: TSH: 1.55 u[IU]/mL (ref 0.35–5.50)

## 2012-03-27 LAB — PSA: PSA: 0.92 ng/mL (ref 0.10–4.00)

## 2012-04-02 ENCOUNTER — Encounter: Payer: Self-pay | Admitting: Family Medicine

## 2012-04-02 ENCOUNTER — Ambulatory Visit (INDEPENDENT_AMBULATORY_CARE_PROVIDER_SITE_OTHER): Payer: Federal, State, Local not specified - PPO | Admitting: Family Medicine

## 2012-04-02 VITALS — BP 120/88 | Temp 98.1°F | Ht 72.5 in | Wt 213.0 lb

## 2012-04-02 DIAGNOSIS — J449 Chronic obstructive pulmonary disease, unspecified: Secondary | ICD-10-CM

## 2012-04-02 DIAGNOSIS — I2510 Atherosclerotic heart disease of native coronary artery without angina pectoris: Secondary | ICD-10-CM

## 2012-04-02 DIAGNOSIS — I10 Essential (primary) hypertension: Secondary | ICD-10-CM

## 2012-04-02 DIAGNOSIS — K219 Gastro-esophageal reflux disease without esophagitis: Secondary | ICD-10-CM

## 2012-04-02 DIAGNOSIS — D126 Benign neoplasm of colon, unspecified: Secondary | ICD-10-CM

## 2012-04-02 DIAGNOSIS — I272 Pulmonary hypertension, unspecified: Secondary | ICD-10-CM

## 2012-04-02 DIAGNOSIS — I059 Rheumatic mitral valve disease, unspecified: Secondary | ICD-10-CM

## 2012-04-02 DIAGNOSIS — Z23 Encounter for immunization: Secondary | ICD-10-CM

## 2012-04-02 DIAGNOSIS — I340 Nonrheumatic mitral (valve) insufficiency: Secondary | ICD-10-CM

## 2012-04-02 DIAGNOSIS — E785 Hyperlipidemia, unspecified: Secondary | ICD-10-CM

## 2012-04-02 DIAGNOSIS — R7309 Other abnormal glucose: Secondary | ICD-10-CM

## 2012-04-02 DIAGNOSIS — I2789 Other specified pulmonary heart diseases: Secondary | ICD-10-CM

## 2012-04-02 DIAGNOSIS — I701 Atherosclerosis of renal artery: Secondary | ICD-10-CM

## 2012-04-02 DIAGNOSIS — J4489 Other specified chronic obstructive pulmonary disease: Secondary | ICD-10-CM

## 2012-04-02 MED ORDER — FLUTICASONE-SALMETEROL 500-50 MCG/DOSE IN AEPB: 1.0000 | INHALATION_SPRAY | Freq: Two times a day (BID) | RESPIRATORY_TRACT | Status: DC

## 2012-04-02 MED ORDER — CARVEDILOL 3.125 MG PO TABS: 3.1250 mg | ORAL_TABLET | Freq: Two times a day (BID) | ORAL | Status: DC

## 2012-04-02 MED ORDER — NIZATIDINE 150 MG PO CAPS: 150.0000 mg | ORAL_CAPSULE | Freq: Two times a day (BID) | ORAL | Status: DC

## 2012-04-02 MED ORDER — FUROSEMIDE 40 MG PO TABS: 40.0000 mg | ORAL_TABLET | Freq: Every day | ORAL | Status: DC

## 2012-04-02 MED ORDER — RAMIPRIL 2.5 MG PO CAPS: 2.5000 mg | ORAL_CAPSULE | Freq: Every day | ORAL | Status: DC

## 2012-04-02 NOTE — Progress Notes (Signed)
  Subjective:    Patient ID: [name], male    DOB: July 24, 1945, 66 y.o.   MRN: [id number]  HPI C89 year old male X. smoker who comes in today for physical examination because of a history of smoking-related COPD, coronary disease, hypertension, reflux esophagitis,  He states he gets routine eye care dental care colonoscopy 8 years ago normal vaccinations up-to-date seasonal flu shot done.     Review of Systems  Constitutional: Negative.   HENT: Negative.   Eyes: Negative.   Respiratory: Negative.   Cardiovascular: Negative.   Gastrointestinal: Negative.   Genitourinary: Negative.   Musculoskeletal: Negative.   Skin: Negative.   Neurological: Negative.   Hematological: Negative.   Psychiatric/Behavioral: Negative.        Objective:   Physical Exam  Constitutional: He is oriented to person, place, and time. He appears well-developed and well-nourished.  HENT:  Head: Normocephalic and atraumatic.  Right Ear: External ear normal.  Left Ear: External ear normal.  Nose: Nose normal.  Mouth/Throat: Oropharynx is clear and moist.       Bilateral hearing aids the do not function he can't hear  Eyes: Conjunctivae normal and EOM are normal. Pupils are equal, round, and reactive to light.  Neck: Normal range of motion. Neck supple. No JVD present. No tracheal deviation present. No thyromegaly present.  Cardiovascular: Normal rate, regular rhythm, normal heart sounds and intact distal pulses.  Exam reveals no gallop and no friction rub.   No murmur heard. Pulmonary/Chest: Effort normal. No stridor. No respiratory distress. He has no wheezes. He has no rales. He exhibits no tenderness.       Symmetrical but distant breath sounds consistent with COPD  Abdominal: Soft. Bowel sounds are normal. He exhibits no distension and no mass. There is no tenderness. There is no rebound and no guarding.  Genitourinary: Rectum normal and penis normal. Guaiac negative stool. No penile tenderness.         Implant  3+ BPH nonnodular  Musculoskeletal: Normal range of motion. He exhibits no edema and no tenderness.  Lymphadenopathy:    He has no cervical adenopathy.  Neurological: He is alert and oriented to person, place, and time. He has normal reflexes. No cranial nerve deficit. He exhibits normal muscle tone.  Skin: Skin is warm and dry. No rash noted. No erythema. No pallor.       Venous insufficiency a lot of varicose veins right and left lower extremity trace edema  Psychiatric: He has a normal mood and affect. His behavior is normal. Judgment and thought content normal.          Assessment & Plan:  COPD stable continue Advair 1 puff twice a day  Peripheral edema continue Lasix 40 mg daily  Coronary disease continue Coreg 3.125 mg twice a day  Reflux esophagitis continue Axid 150 mg daily  Hypertension continue Altace 2.5 mg daily  H[facility] loss [facility]  Coronary disease with pacemaker defibrillator followup by Dr. Myrtis Ser in cardiology

## 2012-04-02 NOTE — Patient Instructions (Signed)
Continue your current medications  [facility] and get [facility] for an aud[facility] evaluation since your hearing aids do not work  Return in one year for general medical exam sooner if any problems

## 2012-05-02 ENCOUNTER — Encounter: Payer: Self-pay | Admitting: *Deleted

## 2012-05-08 ENCOUNTER — Ambulatory Visit (INDEPENDENT_AMBULATORY_CARE_PROVIDER_SITE_OTHER): Payer: Federal, State, Local not specified - PPO | Admitting: Internal Medicine

## 2012-05-08 ENCOUNTER — Encounter: Payer: Self-pay | Admitting: Internal Medicine

## 2012-05-08 VITALS — BP 113/67 | HR 71 | Ht 74.0 in | Wt 214.0 lb

## 2012-05-08 DIAGNOSIS — I4891 Unspecified atrial fibrillation: Secondary | ICD-10-CM

## 2012-05-08 DIAGNOSIS — Z4502 Encounter for adjustment and management of automatic implantable cardiac defibrillator: Secondary | ICD-10-CM

## 2012-05-08 DIAGNOSIS — I5022 Chronic systolic (congestive) heart failure: Secondary | ICD-10-CM

## 2012-05-08 DIAGNOSIS — I428 Other cardiomyopathies: Secondary | ICD-10-CM

## 2012-05-08 DIAGNOSIS — I509 Heart failure, unspecified: Secondary | ICD-10-CM

## 2012-05-08 LAB — ICD DEVICE OBSERVATION
CHARGE TIME: 8.5 s
DEV-0020ICD: NEGATIVE
FVT: 0
HV IMPEDENCE: 80 Ohm
LV LEAD IMPEDENCE ICD: 1075 Ohm
LV LEAD THRESHOLD: 0.875 V
RV LEAD AMPLITUDE: 12 mv
RV LEAD IMPEDENCE ICD: 487.5 Ohm
RV LEAD THRESHOLD: 0.625 V
TOT-0008: 0
TOT-0009: 0
TOT-0010: 1
TZAT-0001FASTVT: 1
TZAT-0001SLOWVT: 1
TZAT-0004FASTVT: 8
TZAT-0012FASTVT: 200 ms
TZAT-0013FASTVT: 3
TZAT-0020FASTVT: 1 ms
TZAT-0020SLOWVT: 1 ms
TZON-0004FASTVT: 30
TZON-0004SLOWVT: 40
TZON-0005SLOWVT: 6
TZST-0001FASTVT: 5
TZST-0001SLOWVT: 2
TZST-0001SLOWVT: 4
TZST-0003FASTVT: 27.5 J
TZST-0003FASTVT: 36 J
TZST-0003FASTVT: 40 J
TZST-0003SLOWVT: 17.5 J
TZST-0003SLOWVT: 40 J
VF: 0

## 2012-05-08 NOTE — Assessment & Plan Note (Signed)
His chronic systolic heart failure appears to be class II. I've asked the patient to increase his physical activity, and maintain a low-sodium diet.

## 2012-05-08 NOTE — Progress Notes (Signed)
HPI [name] returns today for followup. He is a very pleasant 66 year old man with a ischemic cardiomyopathy, chronic systolic heart failure, status post biventricular ICD implantation. He had his generator changed out several months ago. He is healed up nicely. He admits to a sedentary lifestyle. He denies peripheral edema or chest pain. No syncope. His heart failure symptoms are class II. He is not particularly motivated to exercise. He denies dietary indiscretion. Allergies  Allergen Reactions  . Warfarin Sodium      Current Outpatient Prescriptions  Medication Sig Dispense Refill  . amoxicillin (AMOXIL) 500 MG capsule Take 500 mg by mouth. Before dental work       . aspirin (BUFPIRIN) 325 MG buffered tablet Take 325 mg by mouth daily.       . carvedilol (COREG) 3.125 MG tablet Take 3.125 mg by mouth daily.      . Fluticasone-Salmeterol (ADVAIR) 500-50 MCG/DOSE AEPB Inhale 1 puff into the lungs every 12 (twelve) hours.  60 each  11  . furosemide (LASIX) 40 MG tablet Take 1 tablet (40 mg total) by mouth daily.  100 tablet  3  . nitroGLYCERIN (NITROSTAT) 0.4 MG SL tablet Place 0.4 mg under the tongue every 5 (five) minutes as needed. For chest pain      . nizatidine (AXID) 150 MG capsule Take 1 capsule (150 mg total) by mouth 2 (two) times daily.  200 capsule  3  . ramipril (ALTACE) 2.5 MG capsule Take 1 capsule (2.5 mg total) by mouth daily.  100 capsule  3     Past Medical History  Diagnosis Date  . CAD (coronary artery disease)     CABG was not done at that time mitral valve replacement done through lateral thoracotomy  . Hypertension   . CVA (cerebral infarction)     2009  . Endocarditis     Bacterial, 2009  . Colon polyps   . Dyslipidemia   . COPD (chronic obstructive pulmonary disease)   . GERD (gastroesophageal reflux disease)   . Atrial fibrillation     AV Node ablation January, 2010, for rapid atrial fib  . Cardiomyopathy     Ischemic  . Ejection fraction < 50%    EF 30%, echo, December, 2010, apex dyskinetic, moderate hypokinesis posterior wall and inferior wall  . Atrial septal defect     Closed with surgery January, 2010  . Mitral regurgitation     Severe,Mitral valve replacement  . ICD (implantable cardiac defibrillator) battery depletion     LV dysfunction and pacer needed for AV node lesion  . Intracranial hemorrhage     Coumadin cannot be used because of the history of his bleed  . Renal artery stenosis     Mild by history  . Pulmonary hypertension     Moderate  . HLD (hyperlipidemia)     ROS:   All systems reviewed and negative except as noted in the HPI.   Past Surgical History  Procedure Date  . Mitral valve replacement     Tissue valve, January, 2010  / Echo, December, 2010, valve working well  . Cardiac defibrillator placement     St Jude, Remote  . Penile prosthesis implant   . Appendectomy   . Other surgical history     biospy of left neck mass  . Leep   . Thoracotomy     right, for mitral valve replacement  . Mitral valve replacement 07/17/2008     Family History  Problem Relation Age  of Onset  . Stomach cancer Father   . Stroke      family history     History   Social History  . Marital Status: Married    Spouse Name: N/A    Number of Children: N/A  . Years of Education: N/A   Occupational History  . Not on file.   Social History Main Topics  . Smoking status: Former Games developer  . Smokeless tobacco: Not on file   Comment: quit 2009  . Alcohol Use: No  . Drug Use: No  . Sexually Active: Not on file   Other Topics Concern  . Not on file   Social History Narrative  . No narrative on file     BP 113/67  Pulse 71  Ht 6\' 2"  (1.88 m)  Wt 214 lb (97.07 kg)  BMI 27.48 kg/m2  SpO2 94%  Physical Exam:  Well appearing middle-aged man, NAD HEENT: Unremarkable Neck:  No JVD, no thyromegally Lungs:  Clear with no wheezes, rales, or rhonchi. Well-healed ICD incision. HEART:  Regular rate rhythm,  no murmurs, no rubs, no clicks Abd:  soft, positive bowel sounds, no organomegally, no rebound, no guarding Ext:  2 plus pulses, no edema, no cyanosis, no clubbing Skin:  No rashes no nodules Neuro:  CN II through XII intact, motor grossly intact  DEVICE  Normal device function.  See PaceArt for details.   Assess/Plan:

## 2012-05-08 NOTE — Assessment & Plan Note (Signed)
His St. Jude biventricular ICD is working normally. We'll plan to recheck in several months. 

## 2012-05-08 NOTE — Patient Instructions (Signed)
Your physician wants you to follow-up in: July 2014 with Dr Court Joy will receive a reminder letter in the mail two months in advance. If you don't receive a letter, please call our office to schedule the follow-up appointment.  Remote monitoring is used to monitor your Pacemaker of ICD from home. This monitoring reduces the number of office visits required to check your device to one time per year. It allows [place] to keep an eye on the functioning of your device to ensure it is working properly. You are scheduled for a device check from home on 08/13/12. You may send your transmission at any time that day. If you have a wireless device, the transmission will be sent automatically. After your physician reviews your transmission, you will receive a postcard with your next transmission date.

## 2012-05-08 NOTE — Assessment & Plan Note (Signed)
He has chronic atrial fibrillation, and his ventricular rate appears to be well-controlled.

## 2012-05-14 ENCOUNTER — Encounter: Payer: Self-pay | Admitting: Cardiology

## 2012-05-14 ENCOUNTER — Ambulatory Visit (INDEPENDENT_AMBULATORY_CARE_PROVIDER_SITE_OTHER): Payer: Medicare Other | Admitting: Cardiology

## 2012-05-14 VITALS — BP 120/70 | HR 74 | Ht 74.0 in | Wt 215.0 lb

## 2012-05-14 DIAGNOSIS — I5022 Chronic systolic (congestive) heart failure: Secondary | ICD-10-CM

## 2012-05-14 DIAGNOSIS — I629 Nontraumatic intracranial hemorrhage, unspecified: Secondary | ICD-10-CM

## 2012-05-14 DIAGNOSIS — I059 Rheumatic mitral valve disease, unspecified: Secondary | ICD-10-CM

## 2012-05-14 DIAGNOSIS — I2510 Atherosclerotic heart disease of native coronary artery without angina pectoris: Secondary | ICD-10-CM | POA: Diagnosis not present

## 2012-05-14 DIAGNOSIS — R0989 Other specified symptoms and signs involving the circulatory and respiratory systems: Secondary | ICD-10-CM

## 2012-05-14 DIAGNOSIS — E785 Hyperlipidemia, unspecified: Secondary | ICD-10-CM | POA: Diagnosis not present

## 2012-05-14 DIAGNOSIS — I340 Nonrheumatic mitral (valve) insufficiency: Secondary | ICD-10-CM

## 2012-05-14 DIAGNOSIS — I509 Heart failure, unspecified: Secondary | ICD-10-CM

## 2012-05-14 DIAGNOSIS — I4891 Unspecified atrial fibrillation: Secondary | ICD-10-CM | POA: Diagnosis not present

## 2012-05-14 DIAGNOSIS — R9430 Abnormal result of cardiovascular function study, unspecified: Secondary | ICD-10-CM

## 2012-05-14 NOTE — Assessment & Plan Note (Signed)
At the time of his cath he had only minimal coronary disease. However we have not followed up his lipids. This will be done in in all make decision about treatment.

## 2012-05-14 NOTE — Assessment & Plan Note (Signed)
Patient is on medications that are appropriate for his LV dysfunction. I've chosen not to repeat his echo at this time. I may consider at the time of the next visit and also consider further titration of his medications.

## 2012-05-14 NOTE — Assessment & Plan Note (Signed)
Patient had an AV node ablation in 2010. This was done for rapid atrial fib. Also his left atrial appendage was closed with cardiac surgery. Maze procedure could not be done at the time of his cardiac surgery. He had his valve replaced from a lateral incision. He is not anticoagulated because of his prior cerebral bleed.

## 2012-05-14 NOTE — Assessment & Plan Note (Signed)
Mitral regurgitation was treated with surgery with mitral valve replacement 2010.

## 2012-05-14 NOTE — Patient Instructions (Addendum)
Your physician wants you to follow-up in: 6 months.   You will receive a reminder letter in the mail two months in advance. If you don't receive a letter, please call our office to schedule the follow-up appointment.  Your physician recommends that you return for lab work in: fasting lipid on 05/21/12 between 8:30am and 4:00pm

## 2012-05-14 NOTE — Assessment & Plan Note (Signed)
Patient's volume status is stable. No change in therapy. 

## 2012-05-14 NOTE — Progress Notes (Signed)
Patient ID: [name], male   DOB: [birth year], 66 y.o.   MRN: [id number]   HPI  Patient is seen for cardiology followup. He is a remarkable gentleman who is done extremely well with a complex history. Ultimately he received a tissue mitral valve in January, 2010. He has an ICD. He has atrial fibrillation. He had AV nodal ablation when his atrial fib could not be controlled in the past. We cannot use Coumadin because of a prior intracranial hemorrhage. He had only very minimal coronary disease at the time of his cath in 2010. Overall he is doing well. He says he feels the best that he has ever felt.  As part of today's evaluation I have gone back to review older records once again. I needed to reassess what we knew about his coronary anatomy. Decisions have to be made about lipid therapy.  The patient saw Dr. Ladona Ridgel for followup of his ICD May 08, 2012. He is unit is working well.  Allergies  Allergen Reactions  . Warfarin Sodium     Current Outpatient Prescriptions  Medication Sig Dispense Refill  . amoxicillin (AMOXIL) 500 MG capsule Take 500 mg by mouth. Before dental work       . aspirin (BUFPIRIN) 325 MG buffered tablet Take 325 mg by mouth daily.       . carvedilol (COREG) 3.125 MG tablet Take 3.125 mg by mouth daily.      . Fluticasone-Salmeterol (ADVAIR) 500-50 MCG/DOSE AEPB Inhale 1 puff into the lungs every 12 (twelve) hours.  60 each  11  . furosemide (LASIX) 40 MG tablet Take 1 tablet (40 mg total) by mouth daily.  100 tablet  3  . nitroGLYCERIN (NITROSTAT) 0.4 MG SL tablet Place 0.4 mg under the tongue every 5 (five) minutes as needed. For chest pain      . nizatidine (AXID) 150 MG capsule Take 1 capsule (150 mg total) by mouth 2 (two) times daily.  200 capsule  3  . ramipril (ALTACE) 2.5 MG capsule Take 1 capsule (2.5 mg total) by mouth daily.  100 capsule  3    History   Social History  . Marital Status: Married    Spouse Name: N/A    Number of Children: N/A  .  Years of Education: N/A   Occupational History  . Not on file.   Social History Main Topics  . Smoking status: Former Games developer  . Smokeless tobacco: Not on file     Comment: quit 2009  . Alcohol Use: No  . Drug Use: No  . Sexually Active: Not on file   Other Topics Concern  . Not on file   Social History Narrative  . No narrative on file    Family History  Problem Relation Age of Onset  . Stomach cancer Father   . Stroke      family history    Past Medical History  Diagnosis Date  . CAD (coronary artery disease)     CABG was not done at that time mitral valve replacement done through lateral thoracotomy  . Hypertension   . CVA (cerebral infarction)     2009  . Endocarditis     Bacterial, 2009  . Colon polyps   . Dyslipidemia   . COPD (chronic obstructive pulmonary disease)   . GERD (gastroesophageal reflux disease)   . Atrial fibrillation     AV Node ablation January, 2010, for rapid atrial fib  . Cardiomyopathy     Ischemic  .  Ejection fraction < 50%     EF 30%, echo, December, 2010, apex dyskinetic, moderate hypokinesis posterior wall and inferior wall  . Atrial septal defect     Closed with surgery January, 2010  . Mitral regurgitation     Severe,Mitral valve replacement  . ICD (implantable cardiac defibrillator) battery depletion     LV dysfunction and pacer needed for AV node lesion  . Intracranial hemorrhage     Coumadin cannot be used because of the history of his bleed  . Renal artery stenosis     Mild by history  . Pulmonary hypertension     Moderate  . HLD (hyperlipidemia)     Past Surgical History  Procedure Date  . Mitral valve replacement     Tissue valve, January, 2010  / Echo, December, 2010, valve working well  . Cardiac defibrillator placement     St Jude, Remote  . Penile prosthesis implant   . Appendectomy   . Other surgical history     biospy of left neck mass  . Leep   . Thoracotomy     right, for mitral valve replacement  .  Mitral valve replacement 07/17/2008    Patient Active Problem List  Diagnosis  . CARCINOMA, SKIN, SQUAMOUS CELL  . COLONIC POLYPS, ADENOMATOUS  . COPD  . GERD  . HYPERGLYCEMIA, FASTING  . CAD (coronary artery disease)  . Hypertension  . CVA (cerebral infarction)  . Endocarditis  . Dyslipidemia  . Atrial fibrillation  . Cardiomyopathy  . Ejection fraction < 50%  . Atrial septal defect  . Mitral regurgitation  . ICD-St.Jude  . Intracranial hemorrhage  . Renal artery stenosis  . Pulmonary hypertension  . Chronic systolic CHF (congestive heart failure)    ROS   Patient denies fever, chills, headache, sweats, rash, change in vision, change in hearing, chest pain, cough, nausea vomiting, urinary symptoms. All other systems are reviewed and are negative.  PHYSICAL EXAM  Patient is in a good mood as always. He is oriented to person time and place. Affect is normal. There is no jugulovenous distention. Lungs are clear. Respiratory effort is nonlabored. Cardiac exam reveals S1 and S2. There are no clicks or significant murmurs. The abdomen is soft. There is no peripheral edema.  Filed Vitals:   05/14/12 1344  BP: 120/70  Pulse: 74  Height: 6\' 2"  (1.88 m)  Weight: 215 lb (97.523 kg)  SpO2: 96%      ASSESSMENT & PLAN

## 2012-05-14 NOTE — Assessment & Plan Note (Signed)
Patient cannot be anticoagulated because of this.

## 2012-05-16 ENCOUNTER — Other Ambulatory Visit: Payer: Self-pay | Admitting: *Deleted

## 2012-05-16 DIAGNOSIS — E785 Hyperlipidemia, unspecified: Secondary | ICD-10-CM

## 2012-05-16 DIAGNOSIS — I2510 Atherosclerotic heart disease of native coronary artery without angina pectoris: Secondary | ICD-10-CM

## 2012-05-16 DIAGNOSIS — I10 Essential (primary) hypertension: Secondary | ICD-10-CM

## 2012-05-21 ENCOUNTER — Other Ambulatory Visit (INDEPENDENT_AMBULATORY_CARE_PROVIDER_SITE_OTHER): Payer: Federal, State, Local not specified - PPO

## 2012-05-21 DIAGNOSIS — E785 Hyperlipidemia, unspecified: Secondary | ICD-10-CM

## 2012-05-21 DIAGNOSIS — I2510 Atherosclerotic heart disease of native coronary artery without angina pectoris: Secondary | ICD-10-CM | POA: Diagnosis not present

## 2012-05-21 DIAGNOSIS — I10 Essential (primary) hypertension: Secondary | ICD-10-CM

## 2012-05-21 LAB — LIPID PANEL
HDL: 37.7 mg/dL — ABNORMAL LOW (ref >39.00)
LDL Cholesterol: 80 mg/dL (ref 0–99)
Total CHOL/HDL Ratio: 4
VLDL: 28.4 mg/dL (ref 0.0–40.0)

## 2012-05-22 ENCOUNTER — Telehealth: Payer: Self-pay | Admitting: Internal Medicine

## 2012-05-28 ENCOUNTER — Telehealth: Payer: Self-pay | Admitting: Internal Medicine

## 2012-05-28 NOTE — Telephone Encounter (Signed)
Spoke to Ms. Ging in reference to her husband's bill for his device replacement.  This device was under warranty and was not to be charged to the patient.  After investigating and speaking with multiple individuals the below is the outcome.   Per Garrison [place], in the cath lab, the [facility]. Jude representative did not indicate this as a warranty device.  Therefore, the account was billed incorrectly.  Deanna had a conference call with billing Nehemiah Massed I think) and they are reversing the charges and contacting the insurance company.  I advised the patient she would receive a corrected bill once the insurance responds to the corrected claim.

## 2012-06-05 ENCOUNTER — Encounter: Payer: Self-pay | Admitting: Cardiology

## 2012-06-05 NOTE — Progress Notes (Signed)
   When I saw the patient last we decided to check his lipids. His LDL is 80. He has not on a statin. I have reviewed all the prior data. He actually had only minimal coronary disease in the past. He has done extremely well on all of his current medications. My inclination is to not start a statin at this time. I will rereview this issue over time and talk with him when I see him next

## 2012-06-11 ENCOUNTER — Telehealth: Payer: Self-pay | Admitting: Cardiology

## 2012-06-11 NOTE — Telephone Encounter (Signed)
Lab results were given to Mrs Serrette.

## 2012-06-11 NOTE — Telephone Encounter (Signed)
N/A.  LMTC. 

## 2012-06-11 NOTE — Telephone Encounter (Signed)
New Problem:    Called returning your call from 06/06/12 about her husbands lab work.  Please call back.

## 2012-06-19 ENCOUNTER — Other Ambulatory Visit: Payer: Self-pay | Admitting: Family Medicine

## 2012-07-02 DIAGNOSIS — I619 Nontraumatic intracerebral hemorrhage, unspecified: Secondary | ICD-10-CM | POA: Diagnosis not present

## 2012-07-02 DIAGNOSIS — I6789 Other cerebrovascular disease: Secondary | ICD-10-CM | POA: Diagnosis not present

## 2012-07-02 DIAGNOSIS — I6350 Cerebral infarction due to unspecified occlusion or stenosis of unspecified cerebral artery: Secondary | ICD-10-CM | POA: Diagnosis not present

## 2012-08-13 ENCOUNTER — Encounter: Payer: Self-pay | Admitting: Internal Medicine

## 2012-08-13 ENCOUNTER — Ambulatory Visit (INDEPENDENT_AMBULATORY_CARE_PROVIDER_SITE_OTHER): Payer: Medicare Other | Admitting: *Deleted

## 2012-08-13 DIAGNOSIS — I428 Other cardiomyopathies: Secondary | ICD-10-CM | POA: Diagnosis not present

## 2012-08-13 DIAGNOSIS — Z4502 Encounter for adjustment and management of automatic implantable cardiac defibrillator: Secondary | ICD-10-CM

## 2012-08-13 DIAGNOSIS — I509 Heart failure, unspecified: Secondary | ICD-10-CM

## 2012-08-13 LAB — REMOTE ICD DEVICE
HV IMPEDENCE: 78 Ohm
LV LEAD IMPEDENCE ICD: 1125 Ohm
RV LEAD IMPEDENCE ICD: 460 Ohm
RV LEAD THRESHOLD: 0.625 V
TZAT-0001FASTVT: 1
TZAT-0001SLOWVT: 1
TZAT-0012FASTVT: 200 ms
TZAT-0020FASTVT: 1 ms
TZAT-0020SLOWVT: 1 ms
TZON-0004FASTVT: 30
TZON-0004SLOWVT: 40
TZON-0005SLOWVT: 6
TZST-0001FASTVT: 5
TZST-0001SLOWVT: 2
TZST-0001SLOWVT: 4
TZST-0003FASTVT: 27.5 J
TZST-0003FASTVT: 40 J
TZST-0003SLOWVT: 17.5 J
VENTRICULAR PACING ICD: 100 pct

## 2012-08-30 ENCOUNTER — Encounter: Payer: Self-pay | Admitting: *Deleted

## 2012-09-13 ENCOUNTER — Telehealth: Payer: Self-pay | Admitting: Cardiology

## 2012-09-13 DIAGNOSIS — I5022 Chronic systolic (congestive) heart failure: Secondary | ICD-10-CM

## 2012-09-13 NOTE — Telephone Encounter (Signed)
Mrs Zelek states that Mr Callaway has a history of passing out and low bp while on the coreg bid which subsided when he decreased it to qd.  They want to know if he can go back to once daily.  Mrs Griggs states that Dr Tawanna Cooler told him he needs to stay take the Coreg bid.  She wants the prescription to read once daily in case something happens to him. She wants to know if Dr Myrtis Ser will order it once daily?

## 2012-09-13 NOTE — Telephone Encounter (Signed)
Pt wife wants to reduce his carvetilol and because he passes out and they need to discuss this

## 2012-09-14 NOTE — Telephone Encounter (Signed)
I am okay with ordering his carvedilol once daily.

## 2012-09-19 MED ORDER — CARVEDILOL 3.125 MG PO TABS: 3.1250 mg | ORAL_TABLET | Freq: Every day | ORAL | Status: DC

## 2012-09-19 NOTE — Telephone Encounter (Signed)
Pt.notified

## 2012-09-19 NOTE — Telephone Encounter (Signed)
N/A.  LMTC. 

## 2012-10-17 ENCOUNTER — Encounter: Payer: Self-pay | Admitting: Family Medicine

## 2012-11-05 ENCOUNTER — Encounter: Payer: Medicare Other | Admitting: *Deleted

## 2012-11-06 ENCOUNTER — Encounter: Payer: Self-pay | Admitting: *Deleted

## 2012-12-07 ENCOUNTER — Encounter: Payer: Self-pay | Admitting: Cardiology

## 2012-12-07 ENCOUNTER — Ambulatory Visit (INDEPENDENT_AMBULATORY_CARE_PROVIDER_SITE_OTHER): Payer: Medicare Other | Admitting: Cardiology

## 2012-12-07 VITALS — BP 117/69 | HR 74 | Ht 74.0 in | Wt 212.8 lb

## 2012-12-07 DIAGNOSIS — I059 Rheumatic mitral valve disease, unspecified: Secondary | ICD-10-CM

## 2012-12-07 DIAGNOSIS — I629 Nontraumatic intracranial hemorrhage, unspecified: Secondary | ICD-10-CM | POA: Diagnosis not present

## 2012-12-07 DIAGNOSIS — I340 Nonrheumatic mitral (valve) insufficiency: Secondary | ICD-10-CM

## 2012-12-07 DIAGNOSIS — I5022 Chronic systolic (congestive) heart failure: Secondary | ICD-10-CM | POA: Diagnosis not present

## 2012-12-07 DIAGNOSIS — E785 Hyperlipidemia, unspecified: Secondary | ICD-10-CM

## 2012-12-07 DIAGNOSIS — I4891 Unspecified atrial fibrillation: Secondary | ICD-10-CM

## 2012-12-07 DIAGNOSIS — I509 Heart failure, unspecified: Secondary | ICD-10-CM

## 2012-12-07 DIAGNOSIS — I428 Other cardiomyopathies: Secondary | ICD-10-CM

## 2012-12-07 DIAGNOSIS — I429 Cardiomyopathy, unspecified: Secondary | ICD-10-CM

## 2012-12-07 MED ORDER — ASPIRIN EC 81 MG PO TBEC: 81.0000 mg | DELAYED_RELEASE_TABLET | Freq: Every day | ORAL | Status: DC

## 2012-12-07 MED ORDER — CARVEDILOL 3.125 MG PO TABS: 3.1250 mg | ORAL_TABLET | Freq: Every day | ORAL | Status: DC

## 2012-12-07 MED ORDER — CARVEDILOL 3.125 MG PO TABS: 3.1250 mg | ORAL_TABLET | Freq: Two times a day (BID) | ORAL | Status: DC

## 2012-12-07 MED ORDER — ATORVASTATIN CALCIUM 20 MG PO TABS: 20.0000 mg | ORAL_TABLET | Freq: Every day | ORAL | Status: DC

## 2012-12-07 NOTE — Assessment & Plan Note (Addendum)
I want to continue to adjust his meds. Currently carvedilol is listed as 3.125 once daily. He will call us back from home to verify this. If he is taking it once daily, the dose will be increased to twice daily. If he is taking it twice daily, the dose will be increased to 6.25 twice a day.  I made this plan with the patient. He then called back to say that when we tried to raise his medicine the past he had a syncopal episode. This has been a long time. However I decided not to increase his medicines.

## 2012-12-07 NOTE — Assessment & Plan Note (Signed)
I've carefully reviewed all the issues concerning his lipids. His LDL is not elevated. He does have mild coronary disease. His 10 year risk score is in the range of 10%. The guidelines do recommend therapy. I will start him on 20 mg of Lipitor daily.

## 2012-12-07 NOTE — Assessment & Plan Note (Signed)
The patient could not have a Maze procedure at the time of his heart surgery. He has had an AV node ablation. He cannot be anticoagulated

## 2012-12-07 NOTE — Assessment & Plan Note (Signed)
The patient did not receive an anticoagulant for his atrial fib because of this problem.

## 2012-12-07 NOTE — Assessment & Plan Note (Signed)
He's doing very well after his mitral valve replacement.

## 2012-12-07 NOTE — Progress Notes (Signed)
HPI  Patient returns for followup of his multiple cardiac issues. He continues to do remarkably well. He's not having any chest pain or shortness of breath. There's been no syncope or presyncope.  Allergies  Allergen Reactions  . Warfarin Sodium     Current Outpatient Prescriptions  Medication Sig Dispense Refill  . amoxicillin (AMOXIL) 500 MG capsule Take 500 mg by mouth. Before dental work       . aspirin (BUFPIRIN) 325 MG buffered tablet Take 325 mg by mouth daily.       . carvedilol (COREG) 3.125 MG tablet Take 3.125 mg by mouth daily.      . carvedilol (COREG) 3.125 MG tablet Take 1 tablet (3.125 mg total) by mouth daily.  90 tablet  2  . Fluticasone-Salmeterol (ADVAIR) 500-50 MCG/DOSE AEPB Inhale 1 puff into the lungs every 12 (twelve) hours.  60 each  11  . furosemide (LASIX) 40 MG tablet Take 1 tablet (40 mg total) by mouth daily.  100 tablet  3  . nitroGLYCERIN (NITROSTAT) 0.4 MG SL tablet Place 0.4 mg under the tongue every 5 (five) minutes as needed. For chest pain      . nizatidine (AXID) 150 MG capsule Take 1 capsule (150 mg total) by mouth 2 (two) times daily.  200 capsule  3  . ramipril (ALTACE) 2.5 MG capsule Take 1 capsule (2.5 mg total) by mouth daily.  100 capsule  3   No current facility-administered medications for this visit.    History   Social History  . Marital Status: Married    Spouse Name: N/A    Number of Children: N/A  . Years of Education: N/A   Occupational History  . Not on file.   Social History Main Topics  . Smoking status: Former Games developer  . Smokeless tobacco: Not on file     Comment: quit 2009  . Alcohol Use: No  . Drug Use: No  . Sexually Active: Not on file   Other Topics Concern  . Not on file   Social History Narrative  . No narrative on file    Family History  Problem Relation Age of Onset  . Stomach cancer Father   . Stroke      family history    Past Medical History  Diagnosis Date  . CAD (coronary artery  disease)     CABG was not done at that time mitral valve replacement done through lateral thoracotomy  . Hypertension   . CVA (cerebral infarction)     2009  . Endocarditis     Bacterial, 2009  . Colon polyps   . Dyslipidemia   . COPD (chronic obstructive pulmonary disease)   . GERD (gastroesophageal reflux disease)   . Atrial fibrillation     AV Node ablation January, 2010, for rapid atrial fib  . Cardiomyopathy     Ischemic  . Ejection fraction < 50%     EF 30%, echo, December, 2010, apex dyskinetic, moderate hypokinesis posterior wall and inferior wall  . Atrial septal defect     Closed with surgery January, 2010  . Mitral regurgitation     Severe,Mitral valve replacement  . ICD (implantable cardiac defibrillator) battery depletion     LV dysfunction and pacer needed for AV node lesion  . Intracranial hemorrhage     Coumadin cannot be used because of the history of his bleed  . Renal artery stenosis     Mild by history  . Pulmonary hypertension  Moderate  . HLD (hyperlipidemia)     Past Surgical History  Procedure Laterality Date  . Mitral valve replacement      Tissue valve, January, 2010  / Echo, December, 2010, valve working well  . Cardiac defibrillator placement      St Jude, Remote  . Penile prosthesis implant    . Appendectomy    . Other surgical history      biospy of left neck mass  . Leep    . Thoracotomy      right, for mitral valve replacement  . Mitral valve replacement  07/17/2008    Patient Active Problem List   Diagnosis Date Noted  . Dyslipidemia     Priority: High  . Atrial fibrillation     Priority: High  . Cardiomyopathy     Priority: High  . Ejection fraction < 50%     Priority: High  . ICD-St.Jude     Priority: High  . Intracranial hemorrhage     Priority: High  . Chronic systolic CHF (congestive heart failure) 01/08/2012  . CAD (coronary artery disease)   . Hypertension   . CVA (cerebral infarction)   . Endocarditis   .  Atrial septal defect   . Mitral regurgitation   . Renal artery stenosis   . Pulmonary hypertension   . CARCINOMA, SKIN, SQUAMOUS CELL 04/20/2009  . HYPERGLYCEMIA, FASTING 04/01/2009  . COLONIC POLYPS, ADENOMATOUS 03/22/2007  . COPD 01/11/2007  . GERD 01/11/2007    ROS   Patient denies fever, chills, headache, sweats, rash, change in vision, change in hearing, chest pain, cough, nausea vomiting, urinary symptoms. All other systems are reviewed and are negative.  PHYSICAL EXAM  Patient is oriented to person time and place. Affect is normal. Lungs are clear. Respiratory effort is nonlabored. Cardiac exam reveals S1 and S2. The abdomen is soft. There's no peripheral edema.  Filed Vitals:   12/07/12 1112  BP: 117/69  Pulse: 74  Height: 6\' 2"  (1.88 m)  Weight: 212 lb 12.8 oz (96.525 kg)  SpO2: 97%     ASSESSMENT & PLAN

## 2012-12-07 NOTE — Patient Instructions (Addendum)
Your physician has recommended you make the following change in your medication: decrease Aspirin to 81 mg daily, start taking Lipitor 20 mg daily at bedtime and increase Coreg (Carvedilol) to 3.125 mg twice daily  Your physician wants you to follow-up in: 6 months. You will receive a reminder letter in the mail two months in advance. If you don't receive a letter, please call our office to schedule the follow-up appointment.

## 2012-12-07 NOTE — Assessment & Plan Note (Signed)
He continues to do remarkably well.

## 2012-12-10 ENCOUNTER — Telehealth: Payer: Self-pay | Admitting: Neurology

## 2012-12-10 NOTE — Telephone Encounter (Signed)
Ok to decrease aspirin to 81 mg daily

## 2012-12-10 NOTE — Telephone Encounter (Signed)
He went to see Dr. Myrtis Ser one day last week and Dr. Pearlean Brownie put him on a aspirin and Dr. Myrtis Ser dropped it to 61 and put him on Lipitor. Before she changes this she wantst to get an ok from "Dr. Pearlean Brownie. Please call and advisel.

## 2012-12-11 NOTE — Telephone Encounter (Signed)
Called the patient to let him know per Dr. Pearlean Brownie,  its ok to decrease medication to 81 MG daily.

## 2013-01-04 ENCOUNTER — Other Ambulatory Visit: Payer: Self-pay | Admitting: *Deleted

## 2013-01-04 MED ORDER — ATORVASTATIN CALCIUM 20 MG PO TABS: 20.0000 mg | ORAL_TABLET | Freq: Every day | ORAL | Status: DC

## 2013-01-04 NOTE — Telephone Encounter (Signed)
Patient calling to have rx of lipitor 90 day instead 30 day.  rx sent for 90day with 1RF    Micki Riley, CMA

## 2013-01-09 ENCOUNTER — Ambulatory Visit: Payer: Self-pay | Admitting: Neurology

## 2013-02-08 ENCOUNTER — Encounter: Payer: Self-pay | Admitting: Internal Medicine

## 2013-02-08 ENCOUNTER — Ambulatory Visit (INDEPENDENT_AMBULATORY_CARE_PROVIDER_SITE_OTHER): Payer: Medicare Other | Admitting: Internal Medicine

## 2013-02-08 VITALS — BP 105/68 | HR 74 | Ht 73.0 in | Wt 214.4 lb

## 2013-02-08 DIAGNOSIS — I509 Heart failure, unspecified: Secondary | ICD-10-CM

## 2013-02-08 DIAGNOSIS — Z4502 Encounter for adjustment and management of automatic implantable cardiac defibrillator: Secondary | ICD-10-CM

## 2013-02-08 DIAGNOSIS — I429 Cardiomyopathy, unspecified: Secondary | ICD-10-CM

## 2013-02-08 DIAGNOSIS — I428 Other cardiomyopathies: Secondary | ICD-10-CM | POA: Diagnosis not present

## 2013-02-08 DIAGNOSIS — I5022 Chronic systolic (congestive) heart failure: Secondary | ICD-10-CM | POA: Diagnosis not present

## 2013-02-08 DIAGNOSIS — I4891 Unspecified atrial fibrillation: Secondary | ICD-10-CM

## 2013-02-08 LAB — ICD DEVICE OBSERVATION
DEVICE MODEL ICD: 7053988
FVT: 0
HV IMPEDENCE: 80 Ohm
LV LEAD THRESHOLD: 0.75 V
MODE SWITCH EPISODES: 0
PACEART VT: 0
RV LEAD AMPLITUDE: 12 mv
TOT-0007: 0
TZAT-0004FASTVT: 8
TZAT-0004SLOWVT: 8
TZAT-0012FASTVT: 200 ms
TZAT-0012SLOWVT: 200 ms
TZAT-0013FASTVT: 3
TZAT-0018FASTVT: NEGATIVE
TZAT-0018SLOWVT: NEGATIVE
TZAT-0019FASTVT: 7.5 V
TZAT-0019SLOWVT: 7.5 V
TZON-0003FASTVT: 300 ms
TZON-0003SLOWVT: 370 ms
TZST-0001FASTVT: 3
TZST-0001FASTVT: 4
TZST-0001SLOWVT: 3
TZST-0001SLOWVT: 5
TZST-0003FASTVT: 36 J
TZST-0003FASTVT: 40 J
TZST-0003SLOWVT: 17.5 J
TZST-0003SLOWVT: 40 J

## 2013-02-08 NOTE — Assessment & Plan Note (Signed)
His chronic systolic heart failure is currently class II and well compensated. He will continue his current medical therapy, and I encouraged the patient to maintain a low-sodium diet.

## 2013-02-08 NOTE — Progress Notes (Signed)
HPI Mr. Spratling returns today for followup. He is a very pleasant 67 year old man with multiple medical problems including an ischemic cardiomyopathy, chronic systolic heart failure, atrial fibrillation, unable to take Coumadin, status post biventricular ICD implantation secondary to complete heart block and all of the above. In the interim, he has done well. He walks 2 miles a day. He denies chest pain or shortness of breath. No peripheral edema. Allergies  Allergen Reactions  . Warfarin Sodium      Current Outpatient Prescriptions  Medication Sig Dispense Refill  . amoxicillin (AMOXIL) 500 MG capsule Take 500 mg by mouth. Before dental work       . aspirin EC 81 MG tablet Take 1 tablet (81 mg total) by mouth daily.  90 tablet  3  . atorvastatin (LIPITOR) 20 MG tablet Take 1 tablet (20 mg total) by mouth daily.  90 tablet  1  . carvedilol (COREG) 3.125 MG tablet Take 1 tablet (3.125 mg total) by mouth daily.  90 tablet  1  . Fluticasone-Salmeterol (ADVAIR) 500-50 MCG/DOSE AEPB Inhale 1 puff into the lungs every 12 (twelve) hours.  60 each  11  . furosemide (LASIX) 40 MG tablet Take 1 tablet (40 mg total) by mouth daily.  100 tablet  3  . nitroGLYCERIN (NITROSTAT) 0.4 MG SL tablet Place 0.4 mg under the tongue every 5 (five) minutes as needed. For chest pain      . nizatidine (AXID) 150 MG capsule Take 1 capsule (150 mg total) by mouth 2 (two) times daily.  200 capsule  3  . ramipril (ALTACE) 2.5 MG capsule Take 1 capsule (2.5 mg total) by mouth daily.  100 capsule  3   No current facility-administered medications for this visit.     Past Medical History  Diagnosis Date  . CAD (coronary artery disease)     CABG was not done at that time mitral valve replacement done through lateral thoracotomy  . Hypertension   . CVA (cerebral infarction)     2009  . Endocarditis     Bacterial, 2009  . Colon polyps   . Dyslipidemia   . COPD (chronic obstructive pulmonary disease)   . GERD  (gastroesophageal reflux disease)   . Atrial fibrillation     AV Node ablation January, 2010, for rapid atrial fib  . Cardiomyopathy     Ischemic  . Ejection fraction < 50%     EF 30%, echo, December, 2010, apex dyskinetic, moderate hypokinesis posterior wall and inferior wall  . Atrial septal defect     Closed with surgery January, 2010  . Mitral regurgitation     Severe,Mitral valve replacement  . ICD (implantable cardiac defibrillator) battery depletion     LV dysfunction and pacer needed for AV node lesion  . Intracranial hemorrhage     Coumadin cannot be used because of the history of his bleed  . Renal artery stenosis     Mild by history  . Pulmonary hypertension     Moderate  . HLD (hyperlipidemia)     ROS:   All systems reviewed and negative except as noted in the HPI.   Past Surgical History  Procedure Laterality Date  . Mitral valve replacement      Tissue valve, January, 2010  / Echo, December, 2010, valve working well  . Cardiac defibrillator placement      St Jude, Remote  . Penile prosthesis implant    . Appendectomy    . Other surgical history  biospy of left neck mass  . Leep    . Thoracotomy      right, for mitral valve replacement  . Mitral valve replacement  07/17/2008     Family History  Problem Relation Age of Onset  . Stomach cancer Father   . Stroke      family history     History   Social History  . Marital Status: Married    Spouse Name: N/A    Number of Children: N/A  . Years of Education: N/A   Occupational History  . Not on file.   Social History Main Topics  . Smoking status: Former Games developer  . Smokeless tobacco: Not on file     Comment: quit 2009  . Alcohol Use: No  . Drug Use: No  . Sexually Active: Not on file   Other Topics Concern  . Not on file   Social History Narrative  . No narrative on file     BP 105/68  Pulse 74  Ht 6\' 1"  (1.854 m)  Wt 214 lb 6.4 oz (97.251 kg)  BMI 28.29 kg/m2  Physical  Exam:  Well appearing 67 year old man, NAD HEENT: Unremarkable Neck:  7 cm JVD, no thyromegally Back:  No CVA tenderness Lungs:  Clear with no wheezes, rales, or rhonchi. HEART:  Regular rate rhythm, no murmurs, no rubs, no clicks Abd:  soft, positive bowel sounds, no organomegally, no rebound, no guarding Ext:  2 plus pulses, no edema, no cyanosis, no clubbing Skin:  No rashes no nodules Neuro:  CN II through XII intact, motor grossly intact   DEVICE  Normal device function.  See PaceArt for details.   Assess/Plan:

## 2013-02-08 NOTE — Assessment & Plan Note (Signed)
His St. Jude biventricular ICD is working normally. We'll plan to recheck in several months. 

## 2013-02-08 NOTE — Patient Instructions (Addendum)
Your physician wants you to follow-up in: 12 months with Dr Taylor You will receive a reminder letter in the mail two months in advance. If you don't receive a letter, please call our office to schedule the follow-up appointment.   Remote monitoring is used to monitor your Pacemaker of ICD from home. This monitoring reduces the number of office visits required to check your device to one time per year. It allows us to keep an eye on the functioning of your device to ensure it is working properly. You are scheduled for a device check from home on 05/13/13. You may send your transmission at any time that day. If you have a wireless device, the transmission will be sent automatically. After your physician reviews your transmission, you will receive a postcard with your next transmission date.   

## 2013-03-05 DIAGNOSIS — E119 Type 2 diabetes mellitus without complications: Secondary | ICD-10-CM | POA: Diagnosis not present

## 2013-03-05 DIAGNOSIS — H25019 Cortical age-related cataract, unspecified eye: Secondary | ICD-10-CM | POA: Diagnosis not present

## 2013-03-05 DIAGNOSIS — H2510 Age-related nuclear cataract, unspecified eye: Secondary | ICD-10-CM | POA: Diagnosis not present

## 2013-03-05 DIAGNOSIS — H25049 Posterior subcapsular polar age-related cataract, unspecified eye: Secondary | ICD-10-CM | POA: Diagnosis not present

## 2013-03-05 DIAGNOSIS — H18419 Arcus senilis, unspecified eye: Secondary | ICD-10-CM | POA: Diagnosis not present

## 2013-03-05 DIAGNOSIS — H02839 Dermatochalasis of unspecified eye, unspecified eyelid: Secondary | ICD-10-CM | POA: Diagnosis not present

## 2013-03-25 ENCOUNTER — Telehealth: Payer: Self-pay | Admitting: Family Medicine

## 2013-03-25 DIAGNOSIS — I10 Essential (primary) hypertension: Secondary | ICD-10-CM

## 2013-03-25 DIAGNOSIS — I5022 Chronic systolic (congestive) heart failure: Secondary | ICD-10-CM

## 2013-03-25 DIAGNOSIS — K219 Gastro-esophageal reflux disease without esophagitis: Secondary | ICD-10-CM

## 2013-03-25 DIAGNOSIS — J449 Chronic obstructive pulmonary disease, unspecified: Secondary | ICD-10-CM

## 2013-03-25 MED ORDER — NIZATIDINE 150 MG PO CAPS: 150.0000 mg | ORAL_CAPSULE | Freq: Two times a day (BID) | ORAL | Status: DC

## 2013-03-25 MED ORDER — CARVEDILOL 3.125 MG PO TABS: 3.1250 mg | ORAL_TABLET | Freq: Every day | ORAL | Status: DC

## 2013-03-25 MED ORDER — RAMIPRIL 2.5 MG PO CAPS: 2.5000 mg | ORAL_CAPSULE | Freq: Every day | ORAL | Status: DC

## 2013-03-25 MED ORDER — ATORVASTATIN CALCIUM 20 MG PO TABS: 20.0000 mg | ORAL_TABLET | Freq: Every day | ORAL | Status: DC

## 2013-03-25 MED ORDER — FLUTICASONE-SALMETEROL 500-50 MCG/DOSE IN AEPB: 1.0000 | INHALATION_SPRAY | Freq: Two times a day (BID) | RESPIRATORY_TRACT | Status: DC

## 2013-03-25 MED ORDER — FUROSEMIDE 40 MG PO TABS: 40.0000 mg | ORAL_TABLET | Freq: Every day | ORAL | Status: DC

## 2013-03-25 NOTE — Telephone Encounter (Signed)
Patient is scheduled for a cpx w/ Dr. Tawanna Cooler on 06/18/13. He is requesting refills on all meds to get him to this appt. [facility] [address]

## 2013-03-25 NOTE — Telephone Encounter (Signed)
Rx sent to pharmacy   

## 2013-04-04 ENCOUNTER — Ambulatory Visit (INDEPENDENT_AMBULATORY_CARE_PROVIDER_SITE_OTHER): Payer: Medicare Other | Admitting: *Deleted

## 2013-04-04 DIAGNOSIS — Z23 Encounter for immunization: Secondary | ICD-10-CM | POA: Diagnosis not present

## 2013-04-09 ENCOUNTER — Ambulatory Visit (INDEPENDENT_AMBULATORY_CARE_PROVIDER_SITE_OTHER): Payer: Medicare Other | Admitting: Neurology

## 2013-04-09 ENCOUNTER — Encounter: Payer: Self-pay | Admitting: Neurology

## 2013-04-09 VITALS — BP 117/70 | HR 85 | Temp 97.5°F | Ht 73.0 in | Wt 215.5 lb

## 2013-04-09 DIAGNOSIS — I6789 Other cerebrovascular disease: Secondary | ICD-10-CM

## 2013-04-09 NOTE — Progress Notes (Signed)
[facility] [address] [place]. [place] [address] [phone]       OFFICE FOLLOW-UP NOTE  [name] Date of Birth:  [birth year] Medical Record Number:  [id number]   HPI:  67 year old male with remote left parieto-occipital intracerebral hemorrhage in May 2009 from which he has recovered quite well. Remote history of bacterial endocarditis in 2009 and valve replacement surgery , pacemaker and defibrillator in January 2010. Update 04/09/2013  he returns for followup of her last visit 07/02/12. Continues to do well without recurrence stroke or TIA symptoms. He has recovered his peripheral vision was completely. His quite active moving his yard and cutting trees. He states his blood pressure is well controlled and it is 117/70 now for today. He had his lipid profile checked at last visit with Dr. Tawanna Cooler and as per the patient was fine. He has not had any new health concerns since her last visit and has no complaints today. Carotid Doppler at last visit was fine.   ROS:   14 system review of systems is positive for no complaints today PMH:  Past Medical History  Diagnosis Date  . CAD (coronary artery disease)     CABG was not done at that time mitral valve replacement done through lateral thoracotomy  . Hypertension   . CVA (cerebral infarction)     2009  . Endocarditis     Bacterial, 2009  . Colon polyps   . Dyslipidemia   . COPD (chronic obstructive pulmonary disease)   . GERD (gastroesophageal reflux disease)   . Atrial fibrillation     AV Node ablation January, 2010, for rapid atrial fib  . Cardiomyopathy     Ischemic  . Ejection fraction < 50%     EF 30%, echo, December, 2010, apex dyskinetic, moderate hypokinesis posterior wall and inferior wall  . Atrial septal defect     Closed with surgery January, 2010  . Mitral regurgitation     Severe,Mitral valve replacement  . ICD (implantable cardiac defibrillator) battery depletion     LV dysfunction and  pacer needed for AV node lesion  . Intracranial hemorrhage     Coumadin cannot be used because of the history of his bleed  . Renal artery stenosis     Mild by history  . Pulmonary hypertension     Moderate  . HLD (hyperlipidemia)     Social History:  History   Social History  . Marital Status: Married    Spouse Name: Amil Amen    Number of Children: 0  . Years of Education: College   Occupational History  . Retired     Paramedic   Social History Main Topics  . Smoking status: Former Games developer  . Smokeless tobacco: Never Used     Comment: quit 2009  . Alcohol Use: No     Comment: quit: 1982  . Drug Use: No  . Sexual Activity: Not on file   Other Topics Concern  . Not on file   Social History Narrative   Patient lives at home with his spouse.   Caffeine Use: none    Medications:   Current Outpatient Prescriptions on File Prior to Visit  Medication Sig Dispense Refill  . amoxicillin (AMOXIL) 500 MG capsule Take 500 mg by mouth. Before dental work       . aspirin EC 81 MG tablet Take 1 tablet (81 mg total) by mouth daily.  90 tablet  3  . atorvastatin (LIPITOR) 20 MG  tablet Take 1 tablet (20 mg total) by mouth daily.  90 tablet  0  . carvedilol (COREG) 3.125 MG tablet Take 1 tablet (3.125 mg total) by mouth daily.  90 tablet  0  . Fluticasone-Salmeterol (ADVAIR) 500-50 MCG/DOSE AEPB Inhale 1 puff into the lungs every 12 (twelve) hours.  60 each  1  . furosemide (LASIX) 40 MG tablet Take 1 tablet (40 mg total) by mouth daily.  100 tablet  0  . nitroGLYCERIN (NITROSTAT) 0.4 MG SL tablet Place 0.4 mg under the tongue every 5 (five) minutes as needed. For chest pain      . nizatidine (AXID) 150 MG capsule Take 1 capsule (150 mg total) by mouth 2 (two) times daily.  200 capsule  0  . ramipril (ALTACE) 2.5 MG capsule Take 1 capsule (2.5 mg total) by mouth daily.  100 capsule  0   No current facility-administered medications on file prior to visit.    Allergies:    Allergies  Allergen Reactions  . Warfarin Sodium     Physical Exam General: well developed, well nourished, seated, in no evident distress Head: head normocephalic and atraumatic. Orohparynx benign Neck: supple with no carotid or supraclavicular bruits Cardiovascular: regular rate and rhythm, no murmurs Musculoskeletal: no deformity Skin:  no rash/petichiae. Pacemaker left infraclavicular Vascular:  Normal pulses all extremities Filed Vitals:   04/09/13 1424  BP: 117/70  Pulse: 85  Temp: 97.5 F (36.4 C)    Neurologic Exam Mental Status: Awake and fully alert. Oriented to place and time. Recent and remote memory intact. Attention span, concentration and fund of knowledge appropriate. Mood and affect appropriate.  Cranial Nerves: Fundoscopic exam reveals sharp disc margins. Pupils equal, briskly reactive to light. Extraocular movements full without nystagmus. Visual fields full to confrontation. Hearing diminished bilaterally with hearing aids. Facial sensation intact. Face, tongue, palate moves normally and symmetrically.  Motor: Normal bulk and tone. Normal strength in all tested extremity muscles. Sensory.: intact to tough and pinprick and vibratory.  Coordination: Rapid alternating movements normal in all extremities. Finger-to-nose and heel-to-shin performed accurately bilaterally. Gait and Station: Arises from chair without difficulty. Stance is normal. Gait demonstrates normal stride length and balance . Able to heel, toe and tandem walk without difficulty.  Reflexes: 1+ and symmetric. Toes downgoing.   NIHSS  0 Modified Rankin  1   ASSESSMENT: 68 year old male with remote left parieto-occipital intracerebral hemorrhage in May 2009 from which he has recovered quite well. Remote history of bacterial endocarditis in 2009 and valve replacement surgery , pacemaker and defibrillator in January 2010.    PLAN:  Continue aspirin for stroke prevention and strict control of  hypertension with blood pressure goal below 130/90 and lipids with LDL cholesterol goal below 100 mg percent. No routine followup is necessary with me as his brain hemorrhage was 5 years ago. Followup with his primary physician and may be referred back in the future if necessary.

## 2013-04-09 NOTE — Patient Instructions (Addendum)
Continue aspirin for stroke prevention and strict control of hypertension with blood pressure goal below 130/90 and lipids with LDL cholesterol goal below 100 mg percent. No routine followup is necessary with me as his brain hemorrhage was 5 years ago. Followup with his primary physician and may be referred back in the future if necessary.

## 2013-04-22 DIAGNOSIS — Z961 Presence of intraocular lens: Secondary | ICD-10-CM | POA: Diagnosis not present

## 2013-04-22 DIAGNOSIS — H269 Unspecified cataract: Secondary | ICD-10-CM | POA: Diagnosis not present

## 2013-04-22 DIAGNOSIS — H2510 Age-related nuclear cataract, unspecified eye: Secondary | ICD-10-CM | POA: Diagnosis not present

## 2013-04-23 DIAGNOSIS — H2510 Age-related nuclear cataract, unspecified eye: Secondary | ICD-10-CM | POA: Diagnosis not present

## 2013-04-30 DIAGNOSIS — H2510 Age-related nuclear cataract, unspecified eye: Secondary | ICD-10-CM | POA: Diagnosis not present

## 2013-04-30 DIAGNOSIS — Z961 Presence of intraocular lens: Secondary | ICD-10-CM | POA: Diagnosis not present

## 2013-05-13 ENCOUNTER — Ambulatory Visit (INDEPENDENT_AMBULATORY_CARE_PROVIDER_SITE_OTHER): Payer: Medicare Other | Admitting: *Deleted

## 2013-05-13 DIAGNOSIS — I509 Heart failure, unspecified: Secondary | ICD-10-CM | POA: Diagnosis not present

## 2013-05-13 DIAGNOSIS — I4891 Unspecified atrial fibrillation: Secondary | ICD-10-CM

## 2013-05-13 DIAGNOSIS — I5022 Chronic systolic (congestive) heart failure: Secondary | ICD-10-CM

## 2013-05-14 ENCOUNTER — Encounter: Payer: Self-pay | Admitting: Internal Medicine

## 2013-05-14 LAB — REMOTE ICD DEVICE
DEV-0020ICD: NEGATIVE
LV LEAD IMPEDENCE ICD: 1100 Ohm
LV LEAD THRESHOLD: 1 V
RV LEAD AMPLITUDE: 11.9 mv
RV LEAD IMPEDENCE ICD: 490 Ohm
TZAT-0004FASTVT: 8
TZAT-0004SLOWVT: 8
TZAT-0012SLOWVT: 200 ms
TZAT-0018FASTVT: NEGATIVE
TZAT-0018SLOWVT: NEGATIVE
TZAT-0019FASTVT: 7.5 V
TZAT-0019SLOWVT: 7.5 V
TZAT-0020FASTVT: 1 ms
TZAT-0020SLOWVT: 1 ms
TZON-0003FASTVT: 300 ms
TZON-0003SLOWVT: 370 ms
TZON-0004FASTVT: 30
TZON-0004SLOWVT: 40
TZON-0005FASTVT: 6
TZST-0001FASTVT: 2
TZST-0001FASTVT: 3
TZST-0001FASTVT: 4
TZST-0001FASTVT: 5
TZST-0001SLOWVT: 2
TZST-0001SLOWVT: 3
TZST-0003FASTVT: 36 J
TZST-0003FASTVT: 40 J
TZST-0003FASTVT: 40 J
TZST-0003SLOWVT: 17.5 J

## 2013-05-16 NOTE — Progress Notes (Signed)
remot defib received

## 2013-05-17 ENCOUNTER — Encounter: Payer: Self-pay | Admitting: *Deleted

## 2013-05-22 ENCOUNTER — Telehealth: Payer: Self-pay | Admitting: Family Medicine

## 2013-05-22 NOTE — Telephone Encounter (Signed)
Pt called stating he received a letter from [facility] stating that he needs a colonoscopy (pt last had one on 06/02/97) and a zostavax vaccine (pt states he got that here).  Pt wants to make sure that he does not need a colonoscopy and also wants to make sure that he had enough meds filled until he comes in to see Dr. Tawanna Cooler on 12/19. Please advise.

## 2013-05-24 ENCOUNTER — Other Ambulatory Visit: Payer: Self-pay | Admitting: Family Medicine

## 2013-05-24 DIAGNOSIS — R351 Nocturia: Secondary | ICD-10-CM

## 2013-05-24 DIAGNOSIS — R7309 Other abnormal glucose: Secondary | ICD-10-CM

## 2013-05-24 DIAGNOSIS — E785 Hyperlipidemia, unspecified: Secondary | ICD-10-CM

## 2013-05-24 DIAGNOSIS — I639 Cerebral infarction, unspecified: Secondary | ICD-10-CM

## 2013-05-24 DIAGNOSIS — I10 Essential (primary) hypertension: Secondary | ICD-10-CM

## 2013-05-24 NOTE — Telephone Encounter (Signed)
Patient will call GI regarding colonoscopy. Please schedule patient for CPX labs.  I will put orders.

## 2013-05-24 NOTE — Telephone Encounter (Signed)
Spoke with wife.  Patient did not have a zoster vaccine. Patient

## 2013-06-11 ENCOUNTER — Encounter: Payer: Self-pay | Admitting: Family Medicine

## 2013-06-11 ENCOUNTER — Ambulatory Visit (INDEPENDENT_AMBULATORY_CARE_PROVIDER_SITE_OTHER): Payer: Medicare Other | Admitting: Family Medicine

## 2013-06-11 VITALS — BP 118/70 | HR 79 | Temp 97.6°F | Wt 216.0 lb

## 2013-06-11 DIAGNOSIS — L0291 Cutaneous abscess, unspecified: Secondary | ICD-10-CM | POA: Diagnosis not present

## 2013-06-11 MED ORDER — DOXYCYCLINE HYCLATE 100 MG PO CAPS: 100.0000 mg | ORAL_CAPSULE | Freq: Two times a day (BID) | ORAL | Status: AC

## 2013-06-11 NOTE — Progress Notes (Signed)
Pre visit review using our clinic review tool, if applicable. No additional management support is needed unless otherwise documented below in the visit note. 

## 2013-06-11 NOTE — Progress Notes (Signed)
   Subjective:    Patient ID: [name], male    DOB: 02-21-1946, 67 y.o.   MRN: [id number]  HPI Here for one month of a painful boil in the perineum which opens and drains from time to time. No fever.    Review of Systems  Constitutional: Negative.   Skin: Positive for wound.       Objective:   Physical Exam  Constitutional: He appears well-developed and well-nourished.  Skin:  Large firm tender cystic lesion in the perineum          Assessment & Plan:  Use Doxycycline and hot soaks. If not better in 2 weeks I would suggest he see Surgery for an I and D of the area.

## 2013-06-12 ENCOUNTER — Encounter: Payer: Self-pay | Admitting: Cardiology

## 2013-06-12 ENCOUNTER — Ambulatory Visit (INDEPENDENT_AMBULATORY_CARE_PROVIDER_SITE_OTHER): Payer: Medicare Other | Admitting: Cardiology

## 2013-06-12 VITALS — BP 122/78 | HR 70 | Ht 74.0 in | Wt 218.0 lb

## 2013-06-12 DIAGNOSIS — R0989 Other specified symptoms and signs involving the circulatory and respiratory systems: Secondary | ICD-10-CM | POA: Diagnosis not present

## 2013-06-12 DIAGNOSIS — Z953 Presence of xenogenic heart valve: Secondary | ICD-10-CM

## 2013-06-12 DIAGNOSIS — I4891 Unspecified atrial fibrillation: Secondary | ICD-10-CM | POA: Diagnosis not present

## 2013-06-12 DIAGNOSIS — R9430 Abnormal result of cardiovascular function study, unspecified: Secondary | ICD-10-CM

## 2013-06-12 DIAGNOSIS — E785 Hyperlipidemia, unspecified: Secondary | ICD-10-CM

## 2013-06-12 DIAGNOSIS — Z952 Presence of prosthetic heart valve: Secondary | ICD-10-CM

## 2013-06-12 NOTE — Assessment & Plan Note (Signed)
The patient is doing extremely well. He had a tissue mitral prosthesis placed in 2010. There has been no echo since that time. Echocardiogram will be arranged.

## 2013-06-12 NOTE — Patient Instructions (Signed)

## 2013-06-12 NOTE — Assessment & Plan Note (Signed)
Patient had AV node ablation for rapid atrial fib. At the time of his mitral valve surgery in 2010 his left atrial appendage was closed with surgery. Maze procedure could not be done. He is not anticoagulated because of a cerebral hemorrhage in the past. He is doing extremely well.

## 2013-06-12 NOTE — Progress Notes (Signed)
HPI  The patient is seen to followup coronary disease and mitral valve disease and atrial fibrillation. This pleasant gentleman continues to do remarkably well. He had severe mitral regurgitation and left ventricular dysfunction and atrial fib. He cannot be anticoagulated because of an intracranial hemorrhage in the past. He had mitral valve surgery. He's done extremely well. His atrial fib bradycardia was very fast and he required AV node ablation. With this of course he requires pacing that is done in the form of biventricular pacing. He does extremely well.  Allergies  Allergen Reactions  . Warfarin Sodium     Current Outpatient Prescriptions  Medication Sig Dispense Refill  . amoxicillin (AMOXIL) 500 MG capsule Take 500 mg by mouth. Before dental work      . aspirin EC 81 MG tablet Take 1 tablet (81 mg total) by mouth daily.  90 tablet  3  . atorvastatin (LIPITOR) 20 MG tablet Take 1 tablet (20 mg total) by mouth daily.  90 tablet  0  . carvedilol (COREG) 3.125 MG tablet Take 1 tablet (3.125 mg total) by mouth daily.  90 tablet  0  . doxycycline (VIBRAMYCIN) 100 MG capsule Take 1 capsule (100 mg total) by mouth 2 (two) times daily.  28 capsule  0  . Fluticasone-Salmeterol (ADVAIR) 500-50 MCG/DOSE AEPB Inhale 1 puff into the lungs every 12 (twelve) hours.  60 each  1  . furosemide (LASIX) 40 MG tablet Take 1 tablet (40 mg total) by mouth daily.  100 tablet  0  . nitroGLYCERIN (NITROSTAT) 0.4 MG SL tablet Place 0.4 mg under the tongue every 5 (five) minutes as needed. For chest pain      . nizatidine (AXID) 150 MG capsule Take 1 capsule (150 mg total) by mouth 2 (two) times daily.  200 capsule  0  . ramipril (ALTACE) 2.5 MG capsule Take 1 capsule (2.5 mg total) by mouth daily.  100 capsule  0   No current facility-administered medications for this visit.    History   Social History  . Marital Status: Married    Spouse Name: Amil Amen    Number of Children: 0  . Years of Education:  College   Occupational History  . Retired     Paramedic   Social History Main Topics  . Smoking status: Former Games developer  . Smokeless tobacco: Never Used     Comment: quit 2009  . Alcohol Use: No     Comment: quit: 1982  . Drug Use: No  . Sexual Activity: Not on file   Other Topics Concern  . Not on file   Social History Narrative   Patient lives at home with his spouse.   Caffeine Use: none    Family History  Problem Relation Age of Onset  . Stomach cancer Father   . Cancer Father   . Stroke      family history  . Stroke Mother     Past Medical History  Diagnosis Date  . CAD (coronary artery disease)     CABG was not done at that time mitral valve replacement done through lateral thoracotomy  . Hypertension   . CVA (cerebral infarction)     2009  . Endocarditis     Bacterial, 2009  . Colon polyps   . Dyslipidemia   . COPD (chronic obstructive pulmonary disease)   . GERD (gastroesophageal reflux disease)   . Atrial fibrillation     AV Node ablation January, 2010, for rapid  atrial fib  . Cardiomyopathy     Ischemic  . Ejection fraction < 50%     EF 30%, echo, December, 2010, apex dyskinetic, moderate hypokinesis posterior wall and inferior wall  . Atrial septal defect     Closed with surgery January, 2010  . Mitral regurgitation     Severe,Mitral valve replacement  . ICD (implantable cardiac defibrillator) battery depletion     LV dysfunction and pacer needed for AV node lesion  . Intracranial hemorrhage     Coumadin cannot be used because of the history of his bleed  . Renal artery stenosis     Mild by history  . Pulmonary hypertension     Moderate  . HLD (hyperlipidemia)     Past Surgical History  Procedure Laterality Date  . Mitral valve replacement      Tissue valve, January, 2010  / Echo, December, 2010, valve working well  . Cardiac defibrillator placement      St Jude, Remote  . Penile prosthesis implant    . Appendectomy    . Other  surgical history      biospy of left neck mass  . Leep    . Thoracotomy      right, for mitral valve replacement  . Mitral valve replacement  07/17/2008    Patient Active Problem List   Diagnosis Date Noted  . Dyslipidemia     Priority: High  . Atrial fibrillation     Priority: High  . Cardiomyopathy     Priority: High  . Ejection fraction < 50%     Priority: High  . ICD (implantable cardioverter-defibrillator), biventricular, in situ     Priority: High  . Intracranial hemorrhage     Priority: High  . Chronic systolic CHF (congestive heart failure) 01/08/2012  . CAD (coronary artery disease)   . Hypertension   . CVA (cerebral infarction)   . Endocarditis   . Atrial septal defect   . Mitral regurgitation   . Renal artery stenosis   . Pulmonary hypertension   . CARCINOMA, SKIN, SQUAMOUS CELL 04/20/2009  . HYPERGLYCEMIA, FASTING 04/01/2009  . COLONIC POLYPS, ADENOMATOUS 03/22/2007  . COPD 01/11/2007  . GERD 01/11/2007    ROS   Patient denies fever, chills, headache, sweats, rash, change in vision, change in hearing, chest pain, cough, nausea vomiting, urinary symptoms. All other systems are reviewed and are negative.  PHYSICAL EXAM  Patient is oriented to person time and place. Affect is normal. There is no jugulovenous distention. Lungs are clear. Respiratory effort is nonlabored. Cardiac exam reveals S1 and S2. There are no significant murmurs. The abdomen is soft. There is no peripheral edema.  Filed Vitals:   06/12/13 1051  BP: 122/78  Pulse: 70  Height: 6\' 2"  (1.88 m)  Weight: 218 lb (98.884 kg)   EKG is done today and reviewed by me. There is old atrial fibrillation. There is a pacemaker suggestive of biventricular pacing.  ASSESSMENT & PLAN

## 2013-06-12 NOTE — Assessment & Plan Note (Signed)
Historically his ejection fraction was in the range of 30%. He's not had an echo since 2010. It is now time to reassess LV function and his mitral prosthetic function.

## 2013-06-12 NOTE — Assessment & Plan Note (Signed)
After his last visit I decided to start him on a statin. He is receiving atorvastatin. His labs will be checked by his primary care team.

## 2013-06-17 DIAGNOSIS — H269 Unspecified cataract: Secondary | ICD-10-CM | POA: Diagnosis not present

## 2013-06-17 DIAGNOSIS — H2510 Age-related nuclear cataract, unspecified eye: Secondary | ICD-10-CM | POA: Diagnosis not present

## 2013-06-18 ENCOUNTER — Encounter: Payer: Medicare Other | Admitting: Family Medicine

## 2013-06-18 DIAGNOSIS — H5210 Myopia, unspecified eye: Secondary | ICD-10-CM | POA: Diagnosis not present

## 2013-06-18 DIAGNOSIS — Z961 Presence of intraocular lens: Secondary | ICD-10-CM | POA: Diagnosis not present

## 2013-06-18 DIAGNOSIS — H52229 Regular astigmatism, unspecified eye: Secondary | ICD-10-CM | POA: Diagnosis not present

## 2013-06-18 DIAGNOSIS — H2510 Age-related nuclear cataract, unspecified eye: Secondary | ICD-10-CM | POA: Diagnosis not present

## 2013-06-26 ENCOUNTER — Ambulatory Visit (HOSPITAL_COMMUNITY): Payer: Medicare Other | Attending: Cardiology | Admitting: Radiology

## 2013-06-26 ENCOUNTER — Other Ambulatory Visit: Payer: Self-pay

## 2013-06-26 DIAGNOSIS — I059 Rheumatic mitral valve disease, unspecified: Secondary | ICD-10-CM

## 2013-06-26 DIAGNOSIS — I10 Essential (primary) hypertension: Secondary | ICD-10-CM | POA: Diagnosis not present

## 2013-06-26 DIAGNOSIS — Z952 Presence of prosthetic heart valve: Secondary | ICD-10-CM | POA: Insufficient documentation

## 2013-06-26 DIAGNOSIS — J4489 Other specified chronic obstructive pulmonary disease: Secondary | ICD-10-CM | POA: Insufficient documentation

## 2013-06-26 DIAGNOSIS — E785 Hyperlipidemia, unspecified: Secondary | ICD-10-CM | POA: Diagnosis not present

## 2013-06-26 DIAGNOSIS — I428 Other cardiomyopathies: Secondary | ICD-10-CM | POA: Diagnosis not present

## 2013-06-26 DIAGNOSIS — Z953 Presence of xenogenic heart valve: Secondary | ICD-10-CM

## 2013-06-26 DIAGNOSIS — J449 Chronic obstructive pulmonary disease, unspecified: Secondary | ICD-10-CM | POA: Insufficient documentation

## 2013-06-26 DIAGNOSIS — I509 Heart failure, unspecified: Secondary | ICD-10-CM | POA: Insufficient documentation

## 2013-06-26 DIAGNOSIS — I4891 Unspecified atrial fibrillation: Secondary | ICD-10-CM | POA: Diagnosis not present

## 2013-06-26 DIAGNOSIS — Z09 Encounter for follow-up examination after completed treatment for conditions other than malignant neoplasm: Secondary | ICD-10-CM | POA: Diagnosis not present

## 2013-06-26 DIAGNOSIS — I2510 Atherosclerotic heart disease of native coronary artery without angina pectoris: Secondary | ICD-10-CM | POA: Insufficient documentation

## 2013-06-26 NOTE — Progress Notes (Signed)
Echocardiogram performed.  

## 2013-07-01 ENCOUNTER — Encounter: Payer: Self-pay | Admitting: Cardiology

## 2013-07-01 ENCOUNTER — Other Ambulatory Visit (INDEPENDENT_AMBULATORY_CARE_PROVIDER_SITE_OTHER): Payer: Medicare Other

## 2013-07-01 DIAGNOSIS — R351 Nocturia: Secondary | ICD-10-CM

## 2013-07-01 DIAGNOSIS — I6350 Cerebral infarction due to unspecified occlusion or stenosis of unspecified cerebral artery: Secondary | ICD-10-CM

## 2013-07-01 DIAGNOSIS — R7309 Other abnormal glucose: Secondary | ICD-10-CM

## 2013-07-01 DIAGNOSIS — I639 Cerebral infarction, unspecified: Secondary | ICD-10-CM

## 2013-07-01 DIAGNOSIS — E785 Hyperlipidemia, unspecified: Secondary | ICD-10-CM

## 2013-07-01 DIAGNOSIS — I10 Essential (primary) hypertension: Secondary | ICD-10-CM | POA: Diagnosis not present

## 2013-07-01 LAB — LIPID PANEL
Cholesterol: 105 mg/dL (ref 0–200)
HDL: 40.8 mg/dL (ref >39.00)
LDL Cholesterol: 39 mg/dL (ref 0–99)
Triglycerides: 125 mg/dL (ref 0.0–149.0)
VLDL: 25 mg/dL (ref 0.0–40.0)

## 2013-07-01 LAB — CBC WITH DIFFERENTIAL/PLATELET
Basophils Relative: 0.2 % (ref 0.0–3.0)
Eosinophils Relative: 2.3 % (ref 0.0–5.0)
Lymphocytes Relative: 13.6 % (ref 12.0–46.0)
MCV: 92.3 fl (ref 78.0–100.0)
Monocytes Absolute: 0.6 10*3/uL (ref 0.1–1.0)
Monocytes Relative: 8.8 % (ref 3.0–12.0)
Neutro Abs: 5.5 10*3/uL (ref 1.4–7.7)
Neutrophils Relative %: 75.1 % (ref 43.0–77.0)
Platelets: 149 10*3/uL — ABNORMAL LOW (ref 150.0–400.0)
RBC: 5.59 Mil/uL (ref 4.22–5.81)
WBC: 7.4 10*3/uL (ref 4.5–10.5)

## 2013-07-01 LAB — BASIC METABOLIC PANEL
BUN: 21 mg/dL (ref 6–23)
Chloride: 95 mEq/L — ABNORMAL LOW (ref 96–112)
Potassium: 3.9 mEq/L (ref 3.5–5.1)
Sodium: 139 mEq/L (ref 135–145)

## 2013-07-01 LAB — HEPATIC FUNCTION PANEL
Albumin: 4.4 g/dL (ref 3.5–5.2)
Alkaline Phosphatase: 67 U/L (ref 39–117)
Bilirubin, Direct: 0.2 mg/dL (ref 0.0–0.3)
Total Protein: 7.2 g/dL (ref 6.0–8.3)

## 2013-07-01 LAB — TSH: TSH: 1.98 u[IU]/mL (ref 0.35–5.50)

## 2013-07-01 LAB — PSA: PSA: 0.94 ng/mL (ref 0.10–4.00)

## 2013-07-02 ENCOUNTER — Encounter: Payer: Medicare Other | Admitting: Family Medicine

## 2013-07-08 ENCOUNTER — Encounter: Payer: Medicare Other | Admitting: Family Medicine

## 2013-07-26 NOTE — Telephone Encounter (Signed)
NO OTHER INFO

## 2013-07-29 ENCOUNTER — Encounter: Payer: Medicare Other | Admitting: Family Medicine

## 2013-08-05 ENCOUNTER — Ambulatory Visit (INDEPENDENT_AMBULATORY_CARE_PROVIDER_SITE_OTHER): Payer: Medicare Other | Admitting: Family Medicine

## 2013-08-05 ENCOUNTER — Encounter: Payer: Self-pay | Admitting: Family Medicine

## 2013-08-05 VITALS — BP 120/70 | Temp 98.4°F | Ht 72.25 in | Wt 216.0 lb

## 2013-08-05 DIAGNOSIS — Z953 Presence of xenogenic heart valve: Secondary | ICD-10-CM

## 2013-08-05 DIAGNOSIS — J449 Chronic obstructive pulmonary disease, unspecified: Secondary | ICD-10-CM

## 2013-08-05 DIAGNOSIS — R7309 Other abnormal glucose: Secondary | ICD-10-CM | POA: Diagnosis not present

## 2013-08-05 DIAGNOSIS — Z23 Encounter for immunization: Secondary | ICD-10-CM | POA: Diagnosis not present

## 2013-08-05 DIAGNOSIS — J4489 Other specified chronic obstructive pulmonary disease: Secondary | ICD-10-CM

## 2013-08-05 DIAGNOSIS — I2510 Atherosclerotic heart disease of native coronary artery without angina pectoris: Secondary | ICD-10-CM | POA: Diagnosis not present

## 2013-08-05 DIAGNOSIS — I10 Essential (primary) hypertension: Secondary | ICD-10-CM

## 2013-08-05 DIAGNOSIS — I429 Cardiomyopathy, unspecified: Secondary | ICD-10-CM

## 2013-08-05 DIAGNOSIS — I509 Heart failure, unspecified: Secondary | ICD-10-CM

## 2013-08-05 DIAGNOSIS — K219 Gastro-esophageal reflux disease without esophagitis: Secondary | ICD-10-CM

## 2013-08-05 DIAGNOSIS — R7301 Impaired fasting glucose: Secondary | ICD-10-CM | POA: Insufficient documentation

## 2013-08-05 DIAGNOSIS — E785 Hyperlipidemia, unspecified: Secondary | ICD-10-CM

## 2013-08-05 DIAGNOSIS — I428 Other cardiomyopathies: Secondary | ICD-10-CM

## 2013-08-05 DIAGNOSIS — Z952 Presence of prosthetic heart valve: Secondary | ICD-10-CM

## 2013-08-05 DIAGNOSIS — I5022 Chronic systolic (congestive) heart failure: Secondary | ICD-10-CM

## 2013-08-05 MED ORDER — FLUTICASONE-SALMETEROL 500-50 MCG/DOSE IN AEPB: 1.0000 | INHALATION_SPRAY | Freq: Two times a day (BID) | RESPIRATORY_TRACT | Status: DC

## 2013-08-05 MED ORDER — NIZATIDINE 150 MG PO CAPS: 150.0000 mg | ORAL_CAPSULE | Freq: Two times a day (BID) | ORAL | Status: DC

## 2013-08-05 MED ORDER — RAMIPRIL 2.5 MG PO CAPS: 2.5000 mg | ORAL_CAPSULE | Freq: Every day | ORAL | Status: DC

## 2013-08-05 MED ORDER — ATORVASTATIN CALCIUM 20 MG PO TABS: 20.0000 mg | ORAL_TABLET | Freq: Every day | ORAL | Status: DC

## 2013-08-05 MED ORDER — CARVEDILOL 3.125 MG PO TABS: 3.1250 mg | ORAL_TABLET | Freq: Every day | ORAL | Status: DC

## 2013-08-05 MED ORDER — FUROSEMIDE 40 MG PO TABS: 40.0000 mg | ORAL_TABLET | Freq: Every day | ORAL | Status: DC

## 2013-08-05 MED ORDER — NITROGLYCERIN 0.4 MG SL SUBL: 0.4000 mg | SUBLINGUAL_TABLET | SUBLINGUAL | Status: DC | PRN

## 2013-08-05 NOTE — Patient Instructions (Addendum)
Continue your current medications  Call the [facility] and get set up to be seen ASAP for repeat hearing evaluation [facility]  Return in one year for general physical examination sooner if any problems  Stay on a sugar-free diet  Walk 30 minutes daily  Return in 3 months for followup  Labs one week prior

## 2013-08-05 NOTE — Progress Notes (Signed)
   Subjective:    Patient ID: [name], male    DOB: 17-Apr-1946, 68 y.o.   MRN: [id number]  HPI [name] is a 68 year old male X. Smoker,,,,, veteran,,,,,, who comes in today for a Medicare wellness examination because of a history of hyperlipidemia, coronary disease, congestive heart failure, COPD, hypertension, reflux esophagitis, hearing loss, and multiple vascular problems.  His medicines reviewed the been no changes  His hearing aid city got the [place] did not work.  Cognitive function normal he walks on a regular basis home health safety reviewed no issues identified, no guns in the house, he does have a health care power of attorney and living well  He gets routine eye care, dental care, colonoscopy and GI, vaccinations updated Prevnar given today   Review of Systems  Constitutional: Negative.   HENT: Negative.   Eyes: Negative.   Respiratory: Negative.   Cardiovascular: Negative.   Gastrointestinal: Negative.   Endocrine: Negative.   Genitourinary: Negative.   Musculoskeletal: Negative.   Skin: Negative.   Allergic/Immunologic: Negative.   Neurological: Negative.   Hematological: Negative.   Psychiatric/Behavioral: Negative.        Objective:   Physical Exam  Nursing note and vitals reviewed. Constitutional: He is oriented to person, place, and time. He appears well-developed and well-nourished.  HENT:  Head: Normocephalic and atraumatic.  Right Ear: External ear normal.  Left Ear: External ear normal.  Nose: Nose normal.  Mouth/Throat: Oropharynx is clear and moist.  Profound deafness you with the hearing aids ,,,,,,,, they obviously don't work  Eyes: Conjunctivae and EOM are normal. Pupils are equal, round, and reactive to light.  Neck: Normal range of motion. Neck supple. No JVD present. No tracheal deviation present. No thyromegaly present.  Cardiovascular: Normal rate, regular rhythm, normal heart sounds and intact distal pulses.  Exam reveals no gallop and  no friction rub.   No murmur heard. Pulmonary/Chest: Effort normal and breath sounds normal. No stridor. No respiratory distress. He has no wheezes. He has no rales. He exhibits no tenderness.  Abdominal: Soft. Bowel sounds are normal. He exhibits no distension and no mass. There is no tenderness. There is no rebound and no guarding.  Genitourinary: Rectum normal, prostate normal and penis normal. Guaiac negative stool. No penile tenderness.  Musculoskeletal: Normal range of motion. He exhibits no edema and no tenderness.  Lymphadenopathy:    He has no cervical adenopathy.  Neurological: He is alert and oriented to person, place, and time. He has normal reflexes. No cranial nerve deficit. He exhibits normal muscle tone.  Skin: Skin is warm and dry. No rash noted. No erythema. No pallor.  Psychiatric: He has a normal mood and affect. His behavior is normal. Judgment and thought content normal.          Assessment & Plan:  Hypertension at goal continue current therapy  Hyperlipidemia goal continue current therapy  COPD continue Advair 1 puff twice a day  Hypertension continue current treatment program  Reflux esophagitis continue Axid 150 mg daily or twice a day  Profound hearing loss referred back to the [place] for hearing aid evaluation  Coronary disease asymptomatic followed by Dr. Ron Parker  Multiple other cardiovascular problems again followed by Dr. Ron Parker

## 2013-08-05 NOTE — Progress Notes (Signed)
Pre visit review using our clinic review tool, if applicable. No additional management support is needed unless otherwise documented below in the visit note. 

## 2013-08-06 ENCOUNTER — Telehealth: Payer: Self-pay | Admitting: Family Medicine

## 2013-08-06 NOTE — Telephone Encounter (Signed)
Relevant patient education mailed to patient.  

## 2013-08-11 DIAGNOSIS — R6889 Other general symptoms and signs: Secondary | ICD-10-CM | POA: Diagnosis not present

## 2013-08-12 ENCOUNTER — Encounter: Payer: Self-pay | Admitting: Cardiology

## 2013-08-12 ENCOUNTER — Inpatient Hospital Stay (HOSPITAL_COMMUNITY)
Admission: AD | Admit: 2013-08-12 | Discharge: 2013-08-18 | DRG: 190 | Disposition: A | Discharge status: Home / Self Care | Payer: Medicare Other | Source: Ambulatory Visit | Attending: Family Medicine | Admitting: Family Medicine

## 2013-08-12 ENCOUNTER — Observation Stay (HOSPITAL_COMMUNITY): Payer: Medicare Other

## 2013-08-12 ENCOUNTER — Ambulatory Visit (INDEPENDENT_AMBULATORY_CARE_PROVIDER_SITE_OTHER): Payer: Medicare Other | Admitting: Cardiology

## 2013-08-12 VITALS — BP 99/61 | HR 85 | Ht 74.0 in | Wt 217.0 lb

## 2013-08-12 DIAGNOSIS — I272 Pulmonary hypertension, unspecified: Secondary | ICD-10-CM

## 2013-08-12 DIAGNOSIS — Z823 Family history of stroke: Secondary | ICD-10-CM

## 2013-08-12 DIAGNOSIS — Z800 Family history of malignant neoplasm of digestive organs: Secondary | ICD-10-CM | POA: Diagnosis not present

## 2013-08-12 DIAGNOSIS — I428 Other cardiomyopathies: Secondary | ICD-10-CM

## 2013-08-12 DIAGNOSIS — I2789 Other specified pulmonary heart diseases: Secondary | ICD-10-CM

## 2013-08-12 DIAGNOSIS — Q211 Atrial septal defect, unspecified: Secondary | ICD-10-CM

## 2013-08-12 DIAGNOSIS — Z95810 Presence of automatic (implantable) cardiac defibrillator: Secondary | ICD-10-CM | POA: Diagnosis not present

## 2013-08-12 DIAGNOSIS — R0902 Hypoxemia: Secondary | ICD-10-CM | POA: Diagnosis not present

## 2013-08-12 DIAGNOSIS — J4489 Other specified chronic obstructive pulmonary disease: Secondary | ICD-10-CM

## 2013-08-12 DIAGNOSIS — E785 Hyperlipidemia, unspecified: Secondary | ICD-10-CM

## 2013-08-12 DIAGNOSIS — I509 Heart failure, unspecified: Secondary | ICD-10-CM | POA: Diagnosis present

## 2013-08-12 DIAGNOSIS — I279 Pulmonary heart disease, unspecified: Secondary | ICD-10-CM | POA: Diagnosis not present

## 2013-08-12 DIAGNOSIS — R911 Solitary pulmonary nodule: Secondary | ICD-10-CM

## 2013-08-12 DIAGNOSIS — I4821 Permanent atrial fibrillation: Secondary | ICD-10-CM | POA: Diagnosis present

## 2013-08-12 DIAGNOSIS — J189 Pneumonia, unspecified organism: Secondary | ICD-10-CM

## 2013-08-12 DIAGNOSIS — I5022 Chronic systolic (congestive) heart failure: Secondary | ICD-10-CM | POA: Diagnosis present

## 2013-08-12 DIAGNOSIS — I252 Old myocardial infarction: Secondary | ICD-10-CM

## 2013-08-12 DIAGNOSIS — J439 Emphysema, unspecified: Secondary | ICD-10-CM

## 2013-08-12 DIAGNOSIS — I429 Cardiomyopathy, unspecified: Secondary | ICD-10-CM

## 2013-08-12 DIAGNOSIS — K219 Gastro-esophageal reflux disease without esophagitis: Secondary | ICD-10-CM

## 2013-08-12 DIAGNOSIS — R7301 Impaired fasting glucose: Secondary | ICD-10-CM

## 2013-08-12 DIAGNOSIS — R7309 Other abnormal glucose: Secondary | ICD-10-CM

## 2013-08-12 DIAGNOSIS — I38 Endocarditis, valve unspecified: Secondary | ICD-10-CM | POA: Diagnosis present

## 2013-08-12 DIAGNOSIS — I6350 Cerebral infarction due to unspecified occlusion or stenosis of unspecified cerebral artery: Secondary | ICD-10-CM

## 2013-08-12 DIAGNOSIS — I10 Essential (primary) hypertension: Secondary | ICD-10-CM | POA: Diagnosis present

## 2013-08-12 DIAGNOSIS — J9610 Chronic respiratory failure, unspecified whether with hypoxia or hypercapnia: Secondary | ICD-10-CM | POA: Diagnosis present

## 2013-08-12 DIAGNOSIS — I639 Cerebral infarction, unspecified: Secondary | ICD-10-CM | POA: Diagnosis present

## 2013-08-12 DIAGNOSIS — J449 Chronic obstructive pulmonary disease, unspecified: Secondary | ICD-10-CM

## 2013-08-12 DIAGNOSIS — I2510 Atherosclerotic heart disease of native coronary artery without angina pectoris: Secondary | ICD-10-CM | POA: Diagnosis present

## 2013-08-12 DIAGNOSIS — Z952 Presence of prosthetic heart valve: Secondary | ICD-10-CM | POA: Diagnosis not present

## 2013-08-12 DIAGNOSIS — I2589 Other forms of chronic ischemic heart disease: Secondary | ICD-10-CM | POA: Diagnosis present

## 2013-08-12 DIAGNOSIS — IMO0002 Reserved for concepts with insufficient information to code with codable children: Secondary | ICD-10-CM

## 2013-08-12 DIAGNOSIS — J9620 Acute and chronic respiratory failure, unspecified whether with hypoxia or hypercapnia: Secondary | ICD-10-CM | POA: Diagnosis not present

## 2013-08-12 DIAGNOSIS — J438 Other emphysema: Principal | ICD-10-CM | POA: Diagnosis present

## 2013-08-12 DIAGNOSIS — R9430 Abnormal result of cardiovascular function study, unspecified: Secondary | ICD-10-CM

## 2013-08-12 DIAGNOSIS — Z8673 Personal history of transient ischemic attack (TIA), and cerebral infarction without residual deficits: Secondary | ICD-10-CM

## 2013-08-12 DIAGNOSIS — I517 Cardiomegaly: Secondary | ICD-10-CM | POA: Diagnosis not present

## 2013-08-12 DIAGNOSIS — I4891 Unspecified atrial fibrillation: Secondary | ICD-10-CM

## 2013-08-12 DIAGNOSIS — J9621 Acute and chronic respiratory failure with hypoxia: Secondary | ICD-10-CM

## 2013-08-12 DIAGNOSIS — Z87891 Personal history of nicotine dependence: Secondary | ICD-10-CM | POA: Diagnosis not present

## 2013-08-12 DIAGNOSIS — Q2111 Secundum atrial septal defect: Secondary | ICD-10-CM

## 2013-08-12 DIAGNOSIS — Z9889 Other specified postprocedural states: Secondary | ICD-10-CM

## 2013-08-12 DIAGNOSIS — Z79899 Other long term (current) drug therapy: Secondary | ICD-10-CM

## 2013-08-12 DIAGNOSIS — C4490 Unspecified malignant neoplasm of skin, unspecified: Secondary | ICD-10-CM

## 2013-08-12 DIAGNOSIS — D126 Benign neoplasm of colon, unspecified: Secondary | ICD-10-CM

## 2013-08-12 DIAGNOSIS — Z7982 Long term (current) use of aspirin: Secondary | ICD-10-CM | POA: Diagnosis not present

## 2013-08-12 DIAGNOSIS — Z953 Presence of xenogenic heart valve: Secondary | ICD-10-CM

## 2013-08-12 DIAGNOSIS — R0989 Other specified symptoms and signs involving the circulatory and respiratory systems: Secondary | ICD-10-CM

## 2013-08-12 DIAGNOSIS — I701 Atherosclerosis of renal artery: Secondary | ICD-10-CM

## 2013-08-12 DIAGNOSIS — I340 Nonrheumatic mitral (valve) insufficiency: Secondary | ICD-10-CM

## 2013-08-12 DIAGNOSIS — Z85828 Personal history of other malignant neoplasm of skin: Secondary | ICD-10-CM | POA: Diagnosis not present

## 2013-08-12 DIAGNOSIS — Z8679 Personal history of other diseases of the circulatory system: Secondary | ICD-10-CM

## 2013-08-12 DIAGNOSIS — I629 Nontraumatic intracranial hemorrhage, unspecified: Secondary | ICD-10-CM

## 2013-08-12 DIAGNOSIS — J90 Pleural effusion, not elsewhere classified: Secondary | ICD-10-CM | POA: Diagnosis not present

## 2013-08-12 HISTORY — DX: Heart failure, unspecified: I50.9

## 2013-08-12 HISTORY — DX: Cerebral infarction, unspecified: I63.9

## 2013-08-12 HISTORY — DX: Presence of automatic (implantable) cardiac defibrillator: Z95.810

## 2013-08-12 HISTORY — DX: Acute myocardial infarction, unspecified: I21.9

## 2013-08-12 HISTORY — DX: Cardiac arrhythmia, unspecified: I49.9

## 2013-08-12 HISTORY — DX: Pneumonia, unspecified organism: J18.9

## 2013-08-12 LAB — COMPREHENSIVE METABOLIC PANEL
ALT: 14 U/L (ref 0–53)
AST: 15 U/L (ref 0–37)
Albumin: 3.4 g/dL — ABNORMAL LOW (ref 3.5–5.2)
Alkaline Phosphatase: 61 U/L (ref 39–117)
BILIRUBIN TOTAL: 2.9 mg/dL — AB (ref 0.3–1.2)
BUN: 25 mg/dL — AB (ref 6–23)
CHLORIDE: 96 meq/L (ref 96–112)
CO2: 25 mEq/L (ref 19–32)
Calcium: 8.5 mg/dL (ref 8.4–10.5)
Creatinine, Ser: 1.08 mg/dL (ref 0.50–1.35)
GFR calc non Af Amer: 70 mL/min — ABNORMAL LOW (ref >90)
GFR, EST AFRICAN AMERICAN: 81 mL/min — AB (ref >90)
GLUCOSE: 164 mg/dL — AB (ref 70–99)
POTASSIUM: 4 meq/L (ref 3.7–5.3)
Sodium: 138 mEq/L (ref 137–147)
Total Protein: 6.3 g/dL (ref 6.0–8.3)

## 2013-08-12 LAB — CBC
HEMATOCRIT: 48.7 % (ref 39.0–52.0)
HEMOGLOBIN: 16.4 g/dL (ref 13.0–17.0)
MCH: 31.1 pg (ref 26.0–34.0)
MCHC: 33.7 g/dL (ref 30.0–36.0)
MCV: 92.2 fL (ref 78.0–100.0)
Platelets: 197 10*3/uL (ref 150–400)
RBC: 5.28 MIL/uL (ref 4.22–5.81)
RDW: 15.7 % — ABNORMAL HIGH (ref 11.5–15.5)
WBC: 8.1 10*3/uL (ref 4.0–10.5)

## 2013-08-12 LAB — PROTIME-INR
INR: 1.22 (ref 0.00–1.49)
PROTHROMBIN TIME: 15.1 s (ref 11.6–15.2)

## 2013-08-12 LAB — MRSA PCR SCREENING: MRSA BY PCR: NEGATIVE

## 2013-08-12 LAB — BILIRUBIN, DIRECT: BILIRUBIN DIRECT: 0.2 mg/dL (ref 0.0–0.3)

## 2013-08-12 LAB — TROPONIN I
Troponin I: <0.3 ng/mL (ref <0.30)
Troponin I: <0.3 ng/mL (ref <0.30)

## 2013-08-12 LAB — APTT: APTT: 30 s (ref 24–37)

## 2013-08-12 LAB — GLUCOSE, CAPILLARY: Glucose-Capillary: 166 mg/dL — ABNORMAL HIGH (ref 70–99)

## 2013-08-12 MED ORDER — SODIUM CHLORIDE 0.9 % IV SOLN: 250.0000 mL | INTRAVENOUS | Status: DC | PRN

## 2013-08-12 MED ORDER — ALPRAZOLAM 0.25 MG PO TABS
0.2500 mg | ORAL_TABLET | Freq: Two times a day (BID) | ORAL | Status: DC | PRN
Start: 2013-08-12 — End: 2013-08-18

## 2013-08-12 MED ORDER — NITROGLYCERIN 0.4 MG SL SUBL: 0.4000 mg | SUBLINGUAL_TABLET | SUBLINGUAL | Status: DC | PRN

## 2013-08-12 MED ORDER — ZOLPIDEM TARTRATE 5 MG PO TABS: 5.0000 mg | ORAL_TABLET | Freq: Every evening | ORAL | Status: DC | PRN

## 2013-08-12 MED ORDER — ACETAMINOPHEN 325 MG PO TABS: 650.0000 mg | ORAL_TABLET | ORAL | Status: DC | PRN

## 2013-08-12 MED ORDER — ONDANSETRON HCL 4 MG/2ML IJ SOLN: 4.0000 mg | Freq: Four times a day (QID) | INTRAMUSCULAR | Status: DC | PRN

## 2013-08-12 MED ORDER — SODIUM CHLORIDE 0.9 % IJ SOLN: 3.0000 mL | INTRAMUSCULAR | Status: DC | PRN

## 2013-08-12 MED ORDER — SODIUM CHLORIDE 0.9 % IJ SOLN
3.0000 mL | Freq: Two times a day (BID) | INTRAMUSCULAR | Status: DC
  Administered 2013-08-12 – 2013-08-17 (×8): 3 mL via INTRAVENOUS

## 2013-08-12 NOTE — Assessment & Plan Note (Signed)
He had an atrial septal defect repaired at the time of his mitral valve surgery.

## 2013-08-12 NOTE — Assessment & Plan Note (Signed)
At this point today the etiology of his hypoxia is not yet clear. We have no labs or x-rays yet. All blood studies will be obtained. He'll be placed on oxygen. Chest x-ray will be done. We will then decide about further testing. Strong consideration will be given to CT scanning of the lungs. He does have a dry cough which seems to be new. He does have a history of COPD.

## 2013-08-12 NOTE — Assessment & Plan Note (Signed)
There is a history of moderate pulmonary hypertension from the past related to his mitral valve disease. We will have to see if this has had a dramatic worsening. This was not the case at the time of his echo in December, 2014

## 2013-08-12 NOTE — Progress Notes (Signed)
History and physical exam for admission August 12, 2013   HPI:    The patient called our office and was added into the office today. He is seen in the office and admitted to the hospital. The major problem at this moment is hypoxia with an O2 saturation of 79% at rest. There has been also some mild mental status change.  The most recent events her given best by the patient's wife who is in the room. She says that he has had a dry cough. He's also had some shortness of breath. Yesterday he told her he felt poorly. She says that he looked quite pale. She was concerned and called EMS. They came to the house and noted that his O2 saturation was 78%. They encouraged hospitalization. He was not willing and waited today to try to be seen in the office by me. He does have some chest heaviness. He has not been having nausea vomiting or diaphoresis. His overall history is extremely complex and is outlined below. Until we have labs and further data I cannot postulate what the basis of his current hypoxia is. However he clearly needs to be hospitalized for further evaluation.  The patient has a very complex prior history. He has coronary disease and mitral valve disease and atrial fibrillation. In the past he had severe mitral regurgitation and left ventricular dysfunction and atrial fibrillation. He cannot be anticoagulated because of the history of an intracranial hemorrhage in the past. Ultimately he underwent mitral valve surgery with a mitral valve repair. He had very fast atrial fibrillation and this required AV node ablation. Despite his atrial fib however, he cannot be anticoagulated. Of course with his AV node ablation he requires pacing. This is done in the form of biventricular pacing. He has done extremely well over time. He took his significant problems his listed in the problem list below.  Allergies  Allergen Reactions  . Warfarin Sodium     Current Outpatient Prescriptions  Medication Sig  Dispense Refill  . aspirin EC 81 MG tablet Take 1 tablet (81 mg total) by mouth daily.  90 tablet  3  . atorvastatin (LIPITOR) 20 MG tablet Take 1 tablet (20 mg total) by mouth daily.  90 tablet  0  . carvedilol (COREG) 3.125 MG tablet Take 1 tablet (3.125 mg total) by mouth daily.  90 tablet  3  . Fluticasone-Salmeterol (ADVAIR) 500-50 MCG/DOSE AEPB Inhale 1 puff into the lungs every 12 (twelve) hours.  60 each  10  . furosemide (LASIX) 40 MG tablet Take 1 tablet (40 mg total) by mouth daily.  100 tablet  3  . nitroGLYCERIN (NITROSTAT) 0.4 MG SL tablet Place 1 tablet (0.4 mg total) under the tongue every 5 (five) minutes as needed. For chest pain  25 tablet  1  . nizatidine (AXID) 150 MG capsule Take 1 capsule (150 mg total) by mouth 2 (two) times daily.  200 capsule  3  . ramipril (ALTACE) 2.5 MG capsule Take 1 capsule (2.5 mg total) by mouth daily.  100 capsule  3   No current facility-administered medications for this visit.    History   Social History  . Marital Status: Married    Spouse Name: [name]    Number of Children: 0  . Years of Education: College   Occupational History  . Retired     Tour manager   Social History Main Topics  . Smoking status: Former Research scientist (life sciences)  . Smokeless tobacco: Never Used  Comment: quit 2009  . Alcohol Use: No     Comment: quit: 1982  . Drug Use: No  . Sexual Activity: Not on file   Other Topics Concern  . Not on file   Social History Narrative   Patient lives at home with his spouse.   Caffeine Use: none    Family History  Problem Relation Age of Onset  . Stomach cancer Father   . Cancer Father   . Stroke      family history  . Stroke Mother     Past Medical History  Diagnosis Date  . CAD (coronary artery disease)     CABG was not done at that time mitral valve replacement done through lateral thoracotomy  . Hypertension   . CVA (cerebral infarction)     2009  . Endocarditis     Bacterial, 2009  . Colon polyps   .  Dyslipidemia   . COPD (chronic obstructive pulmonary disease)   . GERD (gastroesophageal reflux disease)   . Atrial fibrillation     AV Node ablation January, 2010, for rapid atrial fib  . Cardiomyopathy     Ischemic  . Ejection fraction < 50%     EF 30%, echo, December, 2010, apex dyskinetic, moderate hypokinesis posterior wall and inferior wall  . Atrial septal defect     Closed with surgery January, 2010  . Mitral regurgitation     Severe,Mitral valve replacement  . ICD (implantable cardiac defibrillator) battery depletion     LV dysfunction and pacer needed for AV node lesion  . Intracranial hemorrhage     Coumadin cannot be used because of the history of his bleed  . Renal artery stenosis     Mild by history  . Pulmonary hypertension     Moderate  . HLD (hyperlipidemia)   . Status post mitral valve replacement with bioprosthetic valve     Past Surgical History  Procedure Laterality Date  . Mitral valve replacement      Tissue valve, January, 2010  / Echo, December, 2010, valve working well  . Cardiac defibrillator placement      St Jude, Remote  . Penile prosthesis implant    . Appendectomy    . Other surgical history      biospy of left neck mass  . Leep    . Thoracotomy      right, for mitral valve replacement  . Mitral valve replacement  07/17/2008    Patient Active Problem List   Diagnosis Date Noted  . Dyslipidemia     Priority: High  . Atrial fibrillation     Priority: High  . Cardiomyopathy     Priority: High  . Ejection fraction < 50%     Priority: High  . ICD (implantable cardioverter-defibrillator), biventricular, in situ     Priority: High  . Intracranial hemorrhage     Priority: High  . Fasting hyperglycemia 08/05/2013  . Status post mitral valve replacement with bioprosthetic valve   . Chronic systolic CHF (congestive heart failure) 01/08/2012  . CAD (coronary artery disease)   . Hypertension   . CVA (cerebral infarction)   . Endocarditis    . Atrial septal defect   . Mitral regurgitation   . Renal artery stenosis   . Pulmonary hypertension   . CARCINOMA, SKIN, SQUAMOUS CELL 04/20/2009  . HYPERGLYCEMIA, FASTING 04/01/2009  . COLONIC POLYPS, ADENOMATOUS 03/22/2007  . COPD 01/11/2007  . GERD 01/11/2007    ROS   Patient  denies fever, chills, headache, sweats, rash, change in vision, change in hearing, nausea vomiting, urinary symptoms. All other systems are reviewed and are negative.  PHYSICAL EXAM   Patient's here with his wife. He is oriented to person time and place. Affect is normal. Lung exam reveals fine crackles. There is no respiratory distress. His O2 saturation however is 78%. This is at rest on room air. Cardiac exam reveals S1 and S2. His abdomen is soft. There is no significant peripheral edema.  Filed Vitals:   08/12/13 1416  BP: 99/61  Pulse: 85  Height: 6\' 2"  (1.88 m)  Weight: 217 lb (98.431 kg)  SpO2: 82%   EKG is done today and reviewed by me. He appears that P waves at a regular rate. However I do not believe that the pacing function is related to the P waves. There is a paced rhythm that appears to be biventricular pacing with a rate of 74.  ASSESSMENT & PLAN

## 2013-08-12 NOTE — Significant Event (Signed)
Patient accepted into 2W, 27 from direct admission from Dr. Ron Parker office. Patient arrived on RA with spouse, endorsed CP. Saturations 88% on RA, placed on 2L [place], saturations 93% afterwards. Spoke with MD Ron Parker to update and for orders-received telephone orders. Will continue to monitor. Tyson Parkison, Therapist, sports.

## 2013-08-12 NOTE — Assessment & Plan Note (Signed)
Most recent ejection fraction is 35-40% by echo in December, 2014.

## 2013-08-12 NOTE — Assessment & Plan Note (Signed)
His volume status has been under excellent control. It is possible that he now may have new congestive heart failure as the basis of his dyspnea. He does not have peripheral edema. His rales on exam sound very fine.

## 2013-08-12 NOTE — Assessment & Plan Note (Signed)
Patient had AV node ablation after his biventricular pacemaker was placed because he had uncontrollable rates with his atrial fibrillation

## 2013-08-12 NOTE — H&P (Addendum)
Hilda Blades      History and physical exam for admission August 12, 2013     HPI:     The patient called our office and was added into the office today. He is seen in the office and admitted to the hospital. The major problem at this moment is hypoxia with an O2 saturation of 79% at rest. There has been also some mild mental status change.   The most recent events her given best by the patient's wife who is in the room. She says that he has had a dry cough. He's also had some shortness of breath. Yesterday he told her he felt poorly. She says that he looked quite pale. She was concerned and called EMS. They came to the house and noted that his O2 saturation was 78%. They encouraged hospitalization. He was not willing and waited today to try to be seen in the office by me. He does have some chest heaviness. He has not been having nausea vomiting or diaphoresis. His overall history is extremely complex and is outlined below. Until we have labs and further data I cannot postulate what the basis of his current hypoxia is. However he clearly needs to be hospitalized for further evaluation.   The patient has a very complex prior history. He has coronary disease and mitral valve disease and atrial fibrillation. In the past he had severe mitral regurgitation and left ventricular dysfunction and atrial fibrillation. He cannot be anticoagulated because of the history of an intracranial hemorrhage in the past. Ultimately he underwent mitral valve surgery with a mitral valve repair. He had very fast atrial fibrillation and this required AV node ablation. Despite his atrial fib however, he cannot be anticoagulated. Of course with his AV node ablation he requires pacing. This is done in the form of biventricular pacing. He has done extremely well over time. He took his significant problems his listed in the problem list below.    Allergies   Allergen  Reactions   .  Warfarin Sodium           Current  Outpatient Prescriptions   Medication  Sig  Dispense  Refill   .  aspirin EC 81 MG tablet  Take 1 tablet (81 mg total) by mouth daily.   90 tablet   3   .  atorvastatin (LIPITOR) 20 MG tablet  Take 1 tablet (20 mg total) by mouth daily.   90 tablet   0   .  carvedilol (COREG) 3.125 MG tablet  Take 1 tablet (3.125 mg total) by mouth daily.   90 tablet   3   .  Fluticasone-Salmeterol (ADVAIR) 500-50 MCG/DOSE AEPB  Inhale 1 puff into the lungs every 12 (twelve) hours.   60 each   10   .  furosemide (LASIX) 40 MG tablet  Take 1 tablet (40 mg total) by mouth daily.   100 tablet   3   .  nitroGLYCERIN (NITROSTAT) 0.4 MG SL tablet  Place 1 tablet (0.4 mg total) under the tongue every 5 (five) minutes as needed. For chest pain   25 tablet   1   .  nizatidine (AXID) 150 MG capsule  Take 1 capsule (150 mg total) by mouth 2 (two) times daily.   200 capsule   3   .  ramipril (ALTACE) 2.5 MG capsule  Take 1 capsule (2.5 mg total) by mouth daily.   100 capsule   3  No current facility-administered medications for this visit.         History       Social History   .  Marital Status:  Married       Spouse Name:  [name]       Number of Children:  0   .  Years of Education:  College       Occupational History   .  Retired         Tour manager       Social History Main Topics   .  Smoking status:  Former Research scientist (life sciences)   .  Smokeless tobacco:  Never Used         Comment: quit 2009   .  Alcohol Use:  No         Comment: quit: 1982   .  Drug Use:  No   .  Sexual Activity:  Not on file       Other Topics  Concern   .  Not on file       Social History Narrative     Patient lives at home with his spouse.     Caffeine Use: none         Family History   Problem  Relation  Age of Onset   .  Stomach cancer  Father     .  Cancer  Father     .  Stroke           family history   .  Stroke  Mother           Past Medical History   Diagnosis  Date   .  CAD (coronary artery disease)          CABG was not done at that time mitral valve replacement done through lateral thoracotomy   .  Hypertension     .  CVA (cerebral infarction)         2009   .  Endocarditis         Bacterial, 2009   .  Colon polyps     .  Dyslipidemia     .  COPD (chronic obstructive pulmonary disease)     .  GERD (gastroesophageal reflux disease)     .  Atrial fibrillation         AV Node ablation January, 2010, for rapid atrial fib   .  Cardiomyopathy         Ischemic   .  Ejection fraction < 50%         EF 30%, echo, December, 2010, apex dyskinetic, moderate hypokinesis posterior wall and inferior wall   .  Atrial septal defect         Closed with surgery January, 2010   .  Mitral regurgitation         Severe,Mitral valve replacement   .  ICD (implantable cardiac defibrillator) battery depletion         LV dysfunction and pacer needed for AV node lesion   .  Intracranial hemorrhage         Coumadin cannot be used because of the history of his bleed   .  Renal artery stenosis         Mild by history   .  Pulmonary hypertension         Moderate   .  HLD (hyperlipidemia)     .  Status post mitral valve replacement with bioprosthetic valve  Past Surgical History   Procedure  Laterality  Date   .  Mitral valve replacement           Tissue valve, January, 2010  / Echo, December, 2010, valve working well   .  Cardiac defibrillator placement           St Jude, Remote   .  Penile prosthesis implant       .  Appendectomy       .  Other surgical history           biospy of left neck mass   .  Leep       .  Thoracotomy           right, for mitral valve replacement   .  Mitral valve replacement    07/17/2008         Patient Active Problem List     Diagnosis  Date Noted   .  Dyslipidemia         Priority: High   .  Atrial fibrillation         Priority: High   .  Cardiomyopathy         Priority: High   .  Ejection fraction < 50%         Priority: High   .  ICD (implantable  cardioverter-defibrillator), biventricular, in situ         Priority: High   .  Intracranial hemorrhage         Priority: High   .  Fasting hyperglycemia  08/05/2013   .  Status post mitral valve replacement with bioprosthetic valve     .  Chronic systolic CHF (congestive heart failure)  01/08/2012   .  CAD (coronary artery disease)     .  Hypertension     .  CVA (cerebral infarction)     .  Endocarditis     .  Atrial septal defect     .  Mitral regurgitation     .  Renal artery stenosis     .  Pulmonary hypertension     .  CARCINOMA, SKIN, SQUAMOUS CELL  04/20/2009   .  HYPERGLYCEMIA, FASTING  04/01/2009   .  COLONIC POLYPS, ADENOMATOUS  03/22/2007   .  COPD  01/11/2007   .  GERD  01/11/2007        ROS    Patient denies fever, chills, headache, sweats, rash, change in vision, change in hearing, nausea vomiting, urinary symptoms. All other systems are reviewed and are negative.   PHYSICAL EXAM   Patient's here with his wife. He is oriented to person time and place. Affect is normal. Lung exam reveals fine crackles. There is no respiratory distress. His O2 saturation however is 78%. This is at rest on room air. Cardiac exam reveals S1 and S2. His abdomen is soft. There is no significant peripheral edema.    Filed Vitals:     08/12/13 1416   BP:  99/61   Pulse:  85   Height:  6\' 2"  (1.88 m)   Weight:  217 lb (98.431 kg)   SpO2:  82%      EKG is done today and reviewed by me. He appears that P waves at a regular rate. However I do not believe that the pacing function is related to the P waves. There is a paced rhythm that appears to be biventricular pacing with a rate of 74.   ASSESSMENT &  PLAN         Hypoxia -   At this point today the etiology of his hypoxia is not yet clear. We have no labs or x-rays yet. All blood studies will be obtained. He'll be placed on oxygen. Chest x-ray will be done. We will then decide about further testing. Strong consideration will be  given to CT scanning of the lungs. He does have a dry cough which seems to be new. He does have a history of COPD. He does have a dry cough that has been persistent recently. The cough has not been productive.     Atrial fibrillation -    The patient has underlying atrial fib and atrial flutter by history. His left atrial appendage was closed at the time of his mitral valve surgery. Maze procedure could not be done at that time. He's had an AV node ablation. He has ventricular pacing in the mode of biventricular pacing. He cannot be anticoagulated because of his history of a cerebral bleed.       Atrial septal defect -    He had an atrial septal defect repaired at the time of his mitral valve surgery.       CAD (coronary artery disease) -    The patient had minimal coronary disease in 2010. CABG was not done. He had mitral valve surgery through a lateral thoracotomy. He has been having some chest discomfort. Ongoing ischemia will be ruled out.      Cardiomyopathy -    His most recent ejection fraction was 35-40%. This was by echo in December, 2014.       Chronic systolic CHF (congestive heart failure) -    His volume status has been under excellent control. It is possible that he now may have new congestive heart failure as the basis of his dyspnea. He does not have peripheral edema. His rales on exam sound very fine.      COPD -    There is a history of COPD. However it seems very unlikely that this is the cause of his recent marked hypoxia.       CVA (cerebral infarction) -    The patient did have a neurologic event in 2009. He recovered fully from this. In the past few days he's had some variation in his mental status but this is very nonspecific. We need to consider whether there has been a neurologic event.       Ejection fraction    Most recent ejection fraction is 35-40% by echo in December, 2014.       Endocarditis -   The patient had bacterial endocarditis in 2009. He's had no  recurrence since that time. Blood cultures will be obtained.       Fasting hyperglycemia    He has had some hyperglycemia recently that we'll need to be followed.        ICD (implantable cardioverter-defibrillator), biventricular, in situ -    The patient has an ICD in place. He has biventricular pacing. This will be interrogated.       Intracranial hemorrhage -   There is a history of intracranial hemorrhage in the past. The patient will not be heparinized. We will not use Coumadin or a novel anticoagulant.       Pulmonary hypertension -   There is a history of moderate pulmonary hypertension from the past related to his mitral valve disease. We will have to see if this has had a dramatic worsening.  This was not the case at the time of his echo in December, 2014        H/O atrioventricular nodal ablation -     Patient had AV node ablation after his biventricular pacemaker was placed because he had uncontrollable rates with his atrial fibrillation     Status post mitral valve replacement   2010, patient had a tissue mitral valve replacement.   Chest discomfort   The patient has noted some chest discomfort over the past several days. It is nonspecific. Ischemia will be ruled out.   The patient felt poorly yesterday. His wife says that he had an ashen appearance. EMS documented resting O2 sat of 78% in his home. He decided not to come to the hospital. He had some mild mental confusion. He came into the office today with the same symptoms. His O2 sat is 79% on room air. Etiology of the current events is not clear. The patient is admitted from the office into the hospital. He is not to receive any anticoagulants. Blood work and blood cultures and chest x-ray will be obtained tonight. Based on these results, further decisions can be made. He may need chest CT to rule out pulmonary emboli. Unfortunately, we will have no way to treat this other than an umbrella as he cannot receive an anticoagulant.  He is to be listed as allergic to Coumadin, Pradaxa Xarelto, Eliquis, heparin.. he can receive low-dose aspirin.  Daryel November, MD

## 2013-08-12 NOTE — Assessment & Plan Note (Signed)
The patient has an ICD in place. He has biventricular pacing. This will be interrogated.

## 2013-08-12 NOTE — Assessment & Plan Note (Signed)
The patient had bacterial endocarditis in 2009. He's had no recurrence since that time. Blood cultures will be obtained.

## 2013-08-12 NOTE — Assessment & Plan Note (Signed)
The patient had minimal coronary disease in 2010. CABG was not done. He had mitral valve surgery through a lateral thoracotomy.

## 2013-08-12 NOTE — Assessment & Plan Note (Signed)
The patient did have a neurologic event in 2009. He recovered fully from this. In the past few days he's had some variation in his mental status but this is very nonspecific. We need to consider whether there has been a neurologic event.

## 2013-08-12 NOTE — Assessment & Plan Note (Signed)
The patient has underlying atrial fib and atrial flutter by history. His left atrial appendage was closed at the time of his mitral valve surgery. Maze procedure could not be done at that time. He's had an AV node ablation. He has ventricular pacing in the mode of biventricular pacing. He cannot be anticoagulated because of his history of a cerebral bleed.

## 2013-08-12 NOTE — Assessment & Plan Note (Addendum)
His most recent ejection fraction was 35-40%. This was by echo in December, 2014.

## 2013-08-12 NOTE — Assessment & Plan Note (Signed)
There is a history of intracranial hemorrhage in the past. The patient will not be heparinized. We will not use Coumadin or a novel anticoagulant.

## 2013-08-12 NOTE — Assessment & Plan Note (Signed)
There is a history of COPD. However it seems very unlikely that this is the cause of his recent marked hypoxia.

## 2013-08-12 NOTE — Assessment & Plan Note (Signed)
He has had some hyperglycemia recently that we'll need to be followed.

## 2013-08-13 ENCOUNTER — Encounter (HOSPITAL_COMMUNITY): Payer: Self-pay

## 2013-08-13 ENCOUNTER — Telehealth: Payer: Self-pay | Admitting: Cardiology

## 2013-08-13 ENCOUNTER — Observation Stay (HOSPITAL_COMMUNITY): Payer: Medicare Other

## 2013-08-13 DIAGNOSIS — Z8673 Personal history of transient ischemic attack (TIA), and cerebral infarction without residual deficits: Secondary | ICD-10-CM

## 2013-08-13 DIAGNOSIS — I517 Cardiomegaly: Secondary | ICD-10-CM

## 2013-08-13 DIAGNOSIS — R0902 Hypoxemia: Secondary | ICD-10-CM

## 2013-08-13 DIAGNOSIS — I4891 Unspecified atrial fibrillation: Secondary | ICD-10-CM

## 2013-08-13 DIAGNOSIS — J189 Pneumonia, unspecified organism: Secondary | ICD-10-CM | POA: Insufficient documentation

## 2013-08-13 DIAGNOSIS — I2510 Atherosclerotic heart disease of native coronary artery without angina pectoris: Secondary | ICD-10-CM

## 2013-08-13 DIAGNOSIS — I10 Essential (primary) hypertension: Secondary | ICD-10-CM

## 2013-08-13 DIAGNOSIS — Z8679 Personal history of other diseases of the circulatory system: Secondary | ICD-10-CM

## 2013-08-13 DIAGNOSIS — J438 Other emphysema: Secondary | ICD-10-CM | POA: Diagnosis not present

## 2013-08-13 LAB — COMPREHENSIVE METABOLIC PANEL
ALT: 15 U/L (ref 0–53)
AST: 14 U/L (ref 0–37)
Albumin: 3.3 g/dL — ABNORMAL LOW (ref 3.5–5.2)
Alkaline Phosphatase: 59 U/L (ref 39–117)
BUN: 20 mg/dL (ref 6–23)
CO2: 28 meq/L (ref 19–32)
Calcium: 8.3 mg/dL — ABNORMAL LOW (ref 8.4–10.5)
Chloride: 98 mEq/L (ref 96–112)
Creatinine, Ser: 0.92 mg/dL (ref 0.50–1.35)
GFR, EST NON AFRICAN AMERICAN: 86 mL/min — AB (ref >90)
GLUCOSE: 175 mg/dL — AB (ref 70–99)
Potassium: 4.1 mEq/L (ref 3.7–5.3)
Sodium: 139 mEq/L (ref 137–147)
Total Bilirubin: 3 mg/dL — ABNORMAL HIGH (ref 0.3–1.2)
Total Protein: 6.2 g/dL (ref 6.0–8.3)

## 2013-08-13 LAB — GLUCOSE, CAPILLARY: Glucose-Capillary: 208 mg/dL — ABNORMAL HIGH (ref 70–99)

## 2013-08-13 LAB — TSH: TSH: 2.257 u[IU]/mL (ref 0.350–4.500)

## 2013-08-13 LAB — TROPONIN I: Troponin I: <0.3 ng/mL (ref <0.30)

## 2013-08-13 MED ORDER — DEXTROSE 5 % IV SOLN
1.0000 g | INTRAVENOUS | Status: DC
  Filled 2013-08-13: qty 10

## 2013-08-13 MED ORDER — LEVOFLOXACIN 500 MG PO TABS
500.0000 mg | ORAL_TABLET | Freq: Every day | ORAL | Status: DC
  Administered 2013-08-13 – 2013-08-15 (×3): 500 mg via ORAL
  Filled 2013-08-13 (×3): qty 1

## 2013-08-13 MED ORDER — FUROSEMIDE 40 MG PO TABS
40.0000 mg | ORAL_TABLET | Freq: Every day | ORAL | Status: DC
  Administered 2013-08-13 – 2013-08-18 (×5): 40 mg via ORAL
  Filled 2013-08-13 (×6): qty 1

## 2013-08-13 MED ORDER — IOHEXOL 300 MG/ML  SOLN
75.0000 mL | Freq: Once | INTRAMUSCULAR | Status: AC | PRN
  Administered 2013-08-13: 75 mL via INTRAVENOUS

## 2013-08-13 NOTE — Progress Notes (Signed)
ANTIBIOTIC CONSULT NOTE - INITIAL  Pharmacy Consult for Levaquin Indication: rule out pneumonia  Allergies  Allergen Reactions  . Anticoagulant Compound     Pt had intracranial bleed, therefore all anticoagulation is contra-indicated per Dr. Ron Parker.  . Warfarin Sodium     Patient Measurements: Weight: 213 lb 3 oz (96.7 kg)   Vital Signs: Temp: 97.5 F (36.4 C) (02/03 0504) Temp src: Oral (02/03 0504) BP: 111/72 mmHg (02/03 0504) Pulse Rate: 73 (02/03 0504) Intake/Output from previous day: 02/02 0701 - 02/03 0700 In: 240 [P.O.:240] Out: 225 [Urine:225] Intake/Output from this shift:    Labs:  Recent Labs  08/12/13 1810 08/13/13 0715  WBC 8.1  --   HGB 16.4  --   PLT 197  --   CREATININE 1.08 0.92   The CrCl is unknown because both a height and weight (above a minimum accepted value) are required for this calculation. No results found for this basename: VANCOTROUGH, Corlis Leak, VANCORANDOM, Vaughn, GENTPEAK, GENTRANDOM, TOBRATROUGH, TOBRAPEAK, TOBRARND, AMIKACINPEAK, AMIKACINTROU, AMIKACIN,  in the last 72 hours   Microbiology: Recent Results (from the past 720 hour(s))  MRSA PCR SCREENING     Status: None   Collection Time    08/12/13  8:45 PM      Result Value Range Status   MRSA by PCR NEGATIVE  NEGATIVE Final   Comment:            The GeneXpert MRSA Assay (FDA     approved for NASAL specimens     only), is one component of a     comprehensive MRSA colonization     surveillance program. It is not     intended to diagnose MRSA     infection nor to guide or     monitor treatment for     MRSA infections.    Medical History: Past Medical History  Diagnosis Date  . CAD (coronary artery disease)     CABG was not done at that time mitral valve replacement done through lateral thoracotomy  . Hypertension   . CVA (cerebral infarction)     2009  . Endocarditis     Bacterial, 2009  . Colon polyps   . Dyslipidemia   . COPD (chronic obstructive pulmonary  disease)   . GERD (gastroesophageal reflux disease)   . Atrial fibrillation     AV Node ablation January, 2010, for rapid atrial fib  . Cardiomyopathy     Ischemic  . Ejection fraction < 50%     EF 30%, echo, December, 2010, apex dyskinetic, moderate hypokinesis posterior wall and inferior wall  . Atrial septal defect     Closed with surgery January, 2010  . Mitral regurgitation     Severe,Mitral valve replacement  . ICD (implantable cardiac defibrillator) battery depletion     LV dysfunction and pacer needed for AV node lesion  . Intracranial hemorrhage     Coumadin cannot be used because of the history of his bleed  . Renal artery stenosis     Mild by history  . Pulmonary hypertension     Moderate  . HLD (hyperlipidemia)   . Status post mitral valve replacement with bioprosthetic valve   . H/O atrioventricular nodal ablation     Assessment: Patient is a 68 y.o M admitted to [facility] with c/o SOB.  Chest x-ray with suspicion for PNA.  To start Levaquin for PNA.  Plan:  1)  Levaquin 500mg  PO q24h 2) pharmacy will sign off  Dia Sitter  P 08/13/2013,10:50 AM

## 2013-08-13 NOTE — H&P (Addendum)
PCP:   Joycelyn Man, MD   Chief Complaint:  Worsening shortness of breath  HPI: 68 year old male who   has a past medical history of CAD (coronary artery disease); Hypertension; CVA (cerebral infarction); Endocarditis; Colon polyps; Dyslipidemia; COPD (chronic obstructive pulmonary disease); GERD (gastroesophageal reflux disease); Atrial fibrillation; Cardiomyopathy; Ejection fraction < 50%; Atrial septal defect; Mitral regurgitation; ICD (implantable cardiac defibrillator) battery depletion; Intracranial hemorrhage; Renal artery stenosis; Pulmonary hypertension; HLD (hyperlipidemia); Status post mitral valve replacement with bioprosthetic valve; and H/O atrioventricular nodal ablation. Patient was seen at the cardiology office yesterday, and was found to be hypoxic with O2 sats of 79% at rest. Patient has chronic shortness of breath, patient had dry cough but did not have any fever or chest pains. Patient denies fever, no nausea vomiting or diarrhea, denies chills. Has not been coughing up any phlegm. Chest x-ray showed bilateral infiltrates right more the left concerning for pneumonia more than asymmetric edema, triad hospitalist consulted for management of pneumonia.    Allergies:   Allergies  Allergen Reactions  . Anticoagulant Compound     Pt had intracranial bleed, therefore all anticoagulation is contra-indicated per Dr. Ron Parker.  . Warfarin Sodium       Past Medical History  Diagnosis Date  . CAD (coronary artery disease)     CABG was not done at that time mitral valve replacement done through lateral thoracotomy  . Hypertension   . CVA (cerebral infarction)     2009  . Endocarditis     Bacterial, 2009  . Colon polyps   . Dyslipidemia   . COPD (chronic obstructive pulmonary disease)   . GERD (gastroesophageal reflux disease)   . Atrial fibrillation     AV Node ablation January, 2010, for rapid atrial fib  . Cardiomyopathy     Ischemic  . Ejection fraction < 50%     EF 30%, echo, December, 2010, apex dyskinetic, moderate hypokinesis posterior wall and inferior wall  . Atrial septal defect     Closed with surgery January, 2010  . Mitral regurgitation     Severe,Mitral valve replacement  . ICD (implantable cardiac defibrillator) battery depletion     LV dysfunction and pacer needed for AV node lesion  . Intracranial hemorrhage     Coumadin cannot be used because of the history of his bleed  . Renal artery stenosis     Mild by history  . Pulmonary hypertension     Moderate  . HLD (hyperlipidemia)   . Status post mitral valve replacement with bioprosthetic valve   . H/O atrioventricular nodal ablation     Past Surgical History  Procedure Laterality Date  . Mitral valve replacement      Tissue valve, January, 2010  / Echo, December, 2010, valve working well  . Cardiac defibrillator placement      St Jude, Remote  . Penile prosthesis implant    . Appendectomy    . Other surgical history      biospy of left neck mass  . Leep    . Thoracotomy      right, for mitral valve replacement  . Mitral valve replacement  07/17/2008    Prior to Admission medications   Medication Sig Start Date End Date Taking? Authorizing Provider  aspirin EC 81 MG tablet Take 1 tablet (81 mg total) by mouth daily. 12/07/12  Yes Carlena Bjornstad, MD  atorvastatin (LIPITOR) 20 MG tablet Take 1 tablet (20 mg total) by mouth daily. 08/05/13  Yes Dellis Filbert  Delora Fuel, MD  carvedilol (COREG) 3.125 MG tablet Take 1 tablet (3.125 mg total) by mouth daily. 08/05/13  Yes Dorena Cookey, MD  Fluticasone-Salmeterol (ADVAIR) 500-50 MCG/DOSE AEPB Inhale 1 puff into the lungs every 12 (twelve) hours. 08/05/13  Yes Dorena Cookey, MD  furosemide (LASIX) 40 MG tablet Take 1 tablet (40 mg total) by mouth daily. 08/05/13  Yes Dorena Cookey, MD  nitroGLYCERIN (NITROSTAT) 0.4 MG SL tablet Place 1 tablet (0.4 mg total) under the tongue every 5 (five) minutes as needed. For chest pain 08/05/13  Yes Dorena Cookey, MD  nizatidine (AXID) 150 MG capsule Take 1 capsule (150 mg total) by mouth 2 (two) times daily. 08/05/13  Yes Dorena Cookey, MD  ramipril (ALTACE) 2.5 MG capsule Take 1 capsule (2.5 mg total) by mouth daily. 08/05/13  Yes Dorena Cookey, MD    Social History:  reports that he has quit smoking. He has never used smokeless tobacco. He reports that he does not drink alcohol or use illicit drugs.  Family History  Problem Relation Age of Onset  . Stomach cancer Father   . Cancer Father   . Stroke      family history  . Stroke Mother      All the positives are listed in BOLD  Review of Systems:  HEENT: Headache, blurred vision, runny nose, sore throat Neck: Hypothyroidism, hyperthyroidism,,lymphadenopathy Chest : Shortness of breath, history of COPD, Asthma Heart : Chest pain, history of coronary arterey disease GI:  Nausea, vomiting, diarrhea, constipation, GERD GU: Dysuria, urgency, frequency of urination, hematuria Neuro: Stroke, seizures, syncope Psych: Depression, anxiety, hallucinations   Physical Exam: Blood pressure 111/72, pulse 73, temperature 97.5 F (36.4 C), temperature source Oral, resp. rate 17, weight 96.7 kg (213 lb 3 oz), SpO2 93.00%. Constitutional:   Patient is a well-developed and well-nourished *male in no acute distress and cooperative with exam. Head: Normocephalic and atraumatic Mouth: Mucus membranes moist Eyes: PERRL, EOMI, conjunctivae normal Neck: Supple, No Thyromegaly Cardiovascular: RRR, S1 normal, S2 normal Pulmonary/Chest: Bibasilar crackles Abdominal: Soft. Non-tender, non-distended, bowel sounds are normal, no masses, organomegaly, or guarding present.  Neurological: A&O x3, Strenght is normal and symmetric bilaterally, cranial nerve II-XII are grossly intact, no focal motor deficit, sensory intact to light touch bilaterally.  Extremities : No Cyanosis, Clubbing or Edema   Labs on Admission:  Results for orders placed during the  hospital encounter of 08/12/13 (from the past 48 hour(s))  GLUCOSE, CAPILLARY     Status: Abnormal   Collection Time    08/12/13  5:41 PM      Result Value Range   Glucose-Capillary 166 (*) 70 - 99 mg/dL  CBC     Status: Abnormal   Collection Time    08/12/13  6:10 PM      Result Value Range   WBC 8.1  4.0 - 10.5 K/uL   RBC 5.28  4.22 - 5.81 MIL/uL   Hemoglobin 16.4  13.0 - 17.0 g/dL   HCT 48.7  39.0 - 52.0 %   MCV 92.2  78.0 - 100.0 fL   MCH 31.1  26.0 - 34.0 pg   MCHC 33.7  30.0 - 36.0 g/dL   RDW 15.7 (*) 11.5 - 15.5 %   Platelets 197  150 - 400 K/uL  COMPREHENSIVE METABOLIC PANEL     Status: Abnormal   Collection Time    08/12/13  6:10 PM      Result Value Range  Sodium 138  137 - 147 mEq/L   Potassium 4.0  3.7 - 5.3 mEq/L   Chloride 96  96 - 112 mEq/L   CO2 25  19 - 32 mEq/L   Glucose, Bld 164 (*) 70 - 99 mg/dL   BUN 25 (*) 6 - 23 mg/dL   Creatinine, Ser 1.08  0.50 - 1.35 mg/dL   Calcium 8.5  8.4 - 10.5 mg/dL   Total Protein 6.3  6.0 - 8.3 g/dL   Albumin 3.4 (*) 3.5 - 5.2 g/dL   AST 15  0 - 37 U/L   ALT 14  0 - 53 U/L   Alkaline Phosphatase 61  39 - 117 U/L   Total Bilirubin 2.9 (*) 0.3 - 1.2 mg/dL   GFR calc non Af Amer 70 (*) >90 mL/min   GFR calc Af Amer 81 (*) >90 mL/min   Comment: (NOTE)     The eGFR has been calculated using the CKD EPI equation.     This calculation has not been validated in all clinical situations.     eGFR's persistently <90 mL/min signify possible Chronic Kidney     Disease.  APTT     Status: None   Collection Time    08/12/13  6:10 PM      Result Value Range   aPTT 30  24 - 37 seconds  TROPONIN I     Status: None   Collection Time    08/12/13  6:10 PM      Result Value Range   Troponin I <0.30  <0.30 ng/mL   Comment:            Due to the release kinetics of cTnI,     a negative result within the first hours     of the onset of symptoms does not rule out     myocardial infarction with certainty.     If myocardial infarction  is still suspected,     repeat the test at appropriate intervals.  TSH     Status: None   Collection Time    08/12/13  6:10 PM      Result Value Range   TSH 2.257  0.350 - 4.500 uIU/mL   Comment: Performed at [facility]     Status: None   Collection Time    08/12/13  6:10 PM      Result Value Range   Prothrombin Time 15.1  11.6 - 15.2 seconds   INR 1.22  0.00 - 1.49  BILIRUBIN, DIRECT     Status: None   Collection Time    08/12/13  6:10 PM      Result Value Range   Bilirubin, Direct 0.2  0.0 - 0.3 mg/dL  MRSA PCR SCREENING     Status: None   Collection Time    08/12/13  8:45 PM      Result Value Range   MRSA by PCR NEGATIVE  NEGATIVE   Comment:            The GeneXpert MRSA Assay (FDA     approved for NASAL specimens     only), is one component of a     comprehensive MRSA colonization     surveillance program. It is not     intended to diagnose MRSA     infection nor to guide or     monitor treatment for     MRSA infections.  TROPONIN I     Status:  None   Collection Time    08/12/13 11:02 PM      Result Value Range   Troponin I <0.30  <0.30 ng/mL   Comment:            Due to the release kinetics of cTnI,     a negative result within the first hours     of the onset of symptoms does not rule out     myocardial infarction with certainty.     If myocardial infarction is still suspected,     repeat the test at appropriate intervals.  COMPREHENSIVE METABOLIC PANEL     Status: Abnormal   Collection Time    08/13/13  7:15 AM      Result Value Range   Sodium 139  137 - 147 mEq/L   Potassium 4.1  3.7 - 5.3 mEq/L   Chloride 98  96 - 112 mEq/L   CO2 28  19 - 32 mEq/L   Glucose, Bld 175 (*) 70 - 99 mg/dL   BUN 20  6 - 23 mg/dL   Creatinine, Ser 0.92  0.50 - 1.35 mg/dL   Calcium 8.3 (*) 8.4 - 10.5 mg/dL   Total Protein 6.2  6.0 - 8.3 g/dL   Albumin 3.3 (*) 3.5 - 5.2 g/dL   AST 14  0 - 37 U/L   ALT 15  0 - 53 U/L   Alkaline Phosphatase 59  39 -  117 U/L   Total Bilirubin 3.0 (*) 0.3 - 1.2 mg/dL   GFR calc non Af Amer 86 (*) >90 mL/min   GFR calc Af Amer >90  >90 mL/min   Comment: (NOTE)     The eGFR has been calculated using the CKD EPI equation.     This calculation has not been validated in all clinical situations.     eGFR's persistently <90 mL/min signify possible Chronic Kidney     Disease.  TROPONIN I     Status: None   Collection Time    08/13/13  7:15 AM      Result Value Range   Troponin I <0.30  <0.30 ng/mL   Comment:            Due to the release kinetics of cTnI,     a negative result within the first hours     of the onset of symptoms does not rule out     myocardial infarction with certainty.     If myocardial infarction is still suspected,     repeat the test at appropriate intervals.    Radiological Exams on Admission: Dg Chest 2 View  08/12/2013   CLINICAL DATA:  Shortness of breath.  Hypoxia.  EXAM: CHEST  2 VIEW  COMPARISON:  PA and lateral chest 02/06/2012.  FINDINGS: There is cardiomegaly. There is extensive right middle and lower lobe airspace disease. Milder degree of airspace opacity is seen in the lingula. No pneumothorax identified. The chest is hyperexpanded. Pacing device in place.  IMPRESSION: Right much worse than left airspace disease likely reflects pneumonia rather than asymmetric edema. Followup films to clearing recommended.  Cardiomegaly.  Pulmonary hyperexpansion compatible with emphysema.   Electronically Signed   By: Inge Rise M.D.   On: 08/12/2013 22:33    Assessment/Plan Active Problems:   COPD   CAD (coronary artery disease)   Atrial fibrillation   Cardiomyopathy   Ejection fraction < 50%   ICD (implantable cardioverter-defibrillator), biventricular, in situ   Pulmonary hypertension  Chronic systolic CHF (congestive heart failure)   Status post mitral valve replacement with bioprosthetic valve   Hypoxia   H/O atrioventricular nodal ablation   H/O intracranial  hemorrhage   H/O endocarditis   H/O: CVA (cerebrovascular accident)  Pneumonia Patient was started on Rocephin when he was admitted to the hospital, patient has allergy to anticoagulants so we'll not be able to give Zithromax. I will discontinue Rocephin at this time and start him on Levaquin per pharmacy consult. He does not have leukocytosis, no fever.  Will also obtain CT chest to r/o ILD  Chronic systolic CHF Patient takes Lasix 40 mg at home, we'll restart Lasix underlying  this time. Cardiology following.  Atrial fibrillation Patient has had AV node ablation,He has ventricular pacing in the mode of biventricular pacing. He cannot be anticoagulated because of his history of a cerebral bleed  Coronary artery disease Patient has history of CAD, troponin x3 are negative in the hospital  Elevated bilirubin Patient's liver enzymes are normal, does have mild elevation of bilirubin. We'll obtain abdominal ultrasound to rule out biliary disease.  COPD Patient has  underlying COPD, we'll continue with the Advair.  Code status: Patient is full code  Will take over the care patient under hospitalist service  Time Spent on Admission: 65 min  [facility] Pager: [phone] 08/13/2013, 10:28 AM  If 7PM-7AM, please contact night-coverage  [email or web address]  Password [email or web address]

## 2013-08-13 NOTE — Telephone Encounter (Signed)
LMTCB

## 2013-08-13 NOTE — Telephone Encounter (Signed)
Follow Up ° °Pt returning call from earlier. Please call. °

## 2013-08-13 NOTE — Progress Notes (Signed)
UR completed 

## 2013-08-13 NOTE — Progress Notes (Signed)
Patient ID: [name], male   DOB: 1946-01-22, 68 y.o.   MRN: [id number]    Subjective:  No complaints  Hearing aids not in   Objective:  Filed Vitals:   08/12/13 2100 08/12/13 2143 08/13/13 0504 08/13/13 0640  BP:   111/72   Pulse:   73   Temp:   97.5 F (36.4 C)   TempSrc:   Oral   Resp:  20 17   Weight:   213 lb 3 oz (96.7 kg)   SpO2: 90% 94% 91% 93%    Intake/Output from previous day:  Intake/Output Summary (Last 24 hours) at 08/13/13 0854 Last data filed at 08/13/13 0506  Gross per 24 hour  Intake    240 ml  Output    225 ml  Net     15 ml    Physical Exam: Affect appropriate Chronically ill white male  HEENT: normal Neck supple with no adenopathy JVP normal no bruits no thyromegaly Lungs Poor air movement no active wheezing decreased BS right base  Heart:  S1/S2 no murmur, no rub, gallop or click PMI normal Abdomen: benighn, BS positve, no tenderness, no AAA no bruit.  No HSM or HJR Distal pulses intact with no bruits No edema Neuro non-focal Skin warm and dry No muscular weakness   Lab Results: Basic Metabolic Panel:  Recent Labs  08/12/13 1810 08/13/13 0715  NA 138 139  K 4.0 4.1  CL 96 98  CO2 25 28  GLUCOSE 164* 175*  BUN 25* 20  CREATININE 1.08 0.92  CALCIUM 8.5 8.3*   Liver Function Tests:  Recent Labs  08/12/13 1810 08/13/13 0715  AST 15 14  ALT 14 15  ALKPHOS 61 59  BILITOT 2.9* 3.0*  PROT 6.3 6.2  ALBUMIN 3.4* 3.3*   CBC:  Recent Labs  08/12/13 1810  WBC 8.1  HGB 16.4  HCT 48.7  MCV 92.2  PLT 197   Cardiac Enzymes:  Recent Labs  08/12/13 1810 08/12/13 2302 08/13/13 0715  TROPONINI <0.30 <0.30 <0.30   Thyroid Function Tests:  Recent Labs  08/12/13 1810  TSH 2.257    Imaging: Dg Chest 2 View  08/12/2013   CLINICAL DATA:  Shortness of breath.  Hypoxia.  EXAM: CHEST  2 VIEW  COMPARISON:  PA and lateral chest 02/06/2012.  FINDINGS: There is cardiomegaly. There is extensive right middle and lower  lobe airspace disease. Milder degree of airspace opacity is seen in the lingula. No pneumothorax identified. The chest is hyperexpanded. Pacing device in place.  IMPRESSION: Right much worse than left airspace disease likely reflects pneumonia rather than asymmetric edema. Followup films to clearing recommended.  Cardiomegaly.  Pulmonary hyperexpansion compatible with emphysema.   Electronically Signed   By: Inge Rise M.D.   On: 08/12/2013 22:33    Cardiac Studies:  ECG:  Orders placed during the hospital encounter of 08/12/13  . EKG 12-LEAD     Telemetry:  SR biV pacing with RBBB pattern  Echo:  EF 40%  Bioprosthetic MV  Current facility-administered medications:0.9 %  sodium chloride infusion, 250 mL, Intravenous, PRN, Rhonda G Barrett, PA-C;  acetaminophen (TYLENOL) tablet 650 mg, 650 mg, Oral, Q4H PRN, Rhonda G Barrett, PA-C;  ALPRAZolam (XANAX) tablet 0.25 mg, 0.25 mg, Oral, BID PRN, Rhonda G Barrett, PA-C;  nitroGLYCERIN (NITROSTAT) SL tablet 0.4 mg, 0.4 mg, Sublingual, Q5 Min x 3 PRN, Rhonda G Barrett, PA-C ondansetron (ZOFRAN) injection 4 mg, 4 mg, Intravenous, Q6H PRN, Evelene Croon Barrett,  PA-C;  sodium chloride 0.9 % injection 3 mL, 3 mL, Intravenous, Q12H, Rhonda G Barrett, PA-C, 3 mL at 08/12/13 2141;  sodium chloride 0.9 % injection 3 mL, 3 mL, Intravenous, PRN, Rhonda G Barrett, PA-C;  zolpidem (AMBIEN) tablet 5 mg, 5 mg, Oral, QHS PRN, Evelene Croon Barrett, PA-C    Assessment/Plan:  Pneumonia:  Admitted from office with hypoxemia  Baseline smoking with COPD  CXR with RLL pneumonia.  Will ask hospitalist to take on their service   Will start Rx with ceftriaxone  Afebrile and WBC ok   MV Disease:  Bioprosthetic MV EF 40%  Appears euvolemic Check echo and BNP   PAF:  IN NSR with biV pacing now no anticoagulation per Dr April Manson [facility] 08/13/2013, 8:54 AM

## 2013-08-13 NOTE — Telephone Encounter (Signed)
New problem   Pt's wife want your to know that the pt has cataract implants on both eyes. FYI

## 2013-08-13 NOTE — Telephone Encounter (Signed)
**Note De-Identified [name] Obfuscation** Left detailed message on the pts VM that I received FYI message concerning the pts cataract implants and that if they need to speak to me to please call back @ [phone].

## 2013-08-14 ENCOUNTER — Encounter (HOSPITAL_COMMUNITY): Payer: Self-pay | Admitting: General Practice

## 2013-08-14 ENCOUNTER — Encounter: Payer: Medicare Other | Admitting: *Deleted

## 2013-08-14 ENCOUNTER — Telehealth: Payer: Self-pay | Admitting: Family Medicine

## 2013-08-14 DIAGNOSIS — I428 Other cardiomyopathies: Secondary | ICD-10-CM | POA: Diagnosis not present

## 2013-08-14 DIAGNOSIS — R911 Solitary pulmonary nodule: Secondary | ICD-10-CM

## 2013-08-14 DIAGNOSIS — R0902 Hypoxemia: Secondary | ICD-10-CM | POA: Diagnosis not present

## 2013-08-14 DIAGNOSIS — I4891 Unspecified atrial fibrillation: Secondary | ICD-10-CM | POA: Diagnosis not present

## 2013-08-14 MED ORDER — MOMETASONE FURO-FORMOTEROL FUM 200-5 MCG/ACT IN AERO
2.0000 | INHALATION_SPRAY | Freq: Two times a day (BID) | RESPIRATORY_TRACT | Status: DC
Start: 2013-08-14 — End: 2013-08-15
  Administered 2013-08-14 – 2013-08-15 (×2): 2 via RESPIRATORY_TRACT
  Filled 2013-08-14: qty 8.8

## 2013-08-14 MED ORDER — ATORVASTATIN CALCIUM 20 MG PO TABS
20.0000 mg | ORAL_TABLET | Freq: Every day | ORAL | Status: DC
  Administered 2013-08-14 – 2013-08-18 (×5): 20 mg via ORAL
  Filled 2013-08-14 (×5): qty 1

## 2013-08-14 NOTE — Telephone Encounter (Signed)
Relevant patient education mailed to patient.  

## 2013-08-14 NOTE — Progress Notes (Signed)
Weaned pt down to 3L [place]. Pt sats 93% on 3L. Will continue to monitor.

## 2013-08-14 NOTE — Progress Notes (Signed)
Patient ID: [name], male   DOB: 17-Oct-1945, 68 y.o.   MRN: [id number]    Subjective:  No complaints  Oxygen down to 3 L  Objective:  Filed Vitals:   08/13/13 0640 08/13/13 1135 08/13/13 2027 08/14/13 0330  BP:  109/64 107/68 106/65  Pulse:  76  74  Temp:   97.5 F (36.4 C) 97.6 F (36.4 C)  TempSrc:   Oral Oral  Resp:   18 18  Weight:    212 lb 4.9 oz (96.3 kg)  SpO2: 93%  95% 94%    Intake/Output from previous day:  Intake/Output Summary (Last 24 hours) at 08/14/13 0932 Last data filed at 08/14/13 7062  Gross per 24 hour  Intake    240 ml  Output   1425 ml  Net  -1185 ml    Physical Exam: Affect appropriate Chronically ill white male  HEENT: normal Neck supple with no adenopathy JVP normal no bruits no thyromegaly Lungs Poor air movement no active wheezing decreased BS right base  Heart:  S1/S2 no murmur, no rub, gallop or click PMI normal Abdomen: benighn, BS positve, no tenderness, no AAA no bruit.  No HSM or HJR Distal pulses intact with no bruits No edema Neuro non-focal Skin warm and dry No muscular weakness   Lab Results: Basic Metabolic Panel:  Recent Labs  08/12/13 1810 08/13/13 0715  NA 138 139  K 4.0 4.1  CL 96 98  CO2 25 28  GLUCOSE 164* 175*  BUN 25* 20  CREATININE 1.08 0.92  CALCIUM 8.5 8.3*   Liver Function Tests:  Recent Labs  08/12/13 1810 08/13/13 0715  AST 15 14  ALT 14 15  ALKPHOS 61 59  BILITOT 2.9* 3.0*  PROT 6.3 6.2  ALBUMIN 3.4* 3.3*   CBC:  Recent Labs  08/12/13 1810  WBC 8.1  HGB 16.4  HCT 48.7  MCV 92.2  PLT 197   Cardiac Enzymes:  Recent Labs  08/12/13 1810 08/12/13 2302 08/13/13 0715  TROPONINI <0.30 <0.30 <0.30   Thyroid Function Tests:  Recent Labs  08/12/13 1810  TSH 2.257    Imaging: Dg Chest 2 View  08/12/2013   CLINICAL DATA:  Shortness of breath.  Hypoxia.  EXAM: CHEST  2 VIEW  COMPARISON:  PA and lateral chest 02/06/2012.  FINDINGS: There is cardiomegaly. There is  extensive right middle and lower lobe airspace disease. Milder degree of airspace opacity is seen in the lingula. No pneumothorax identified. The chest is hyperexpanded. Pacing device in place.  IMPRESSION: Right much worse than left airspace disease likely reflects pneumonia rather than asymmetric edema. Followup films to clearing recommended.  Cardiomegaly.  Pulmonary hyperexpansion compatible with emphysema.   Electronically Signed   By: Inge Rise M.D.   On: 08/12/2013 22:33   Ct Chest W Contrast  08/13/2013   CLINICAL DATA:  Progressive shortness of breath. History of coronary artery disease, hypertension, CVA, COPD, atrial fibrillation and pulmonary hypertension.  EXAM: CT CHEST WITH CONTRAST  TECHNIQUE: Multidetector CT imaging of the chest was performed during intravenous contrast administration.  CONTRAST:  19mL OMNIPAQUE IOHEXOL 300 MG/ML  SOLN  COMPARISON:  DG CHEST 2 VIEW dated 08/12/2013; DG CHEST 2 VIEW dated 02/06/2012; CT ABD W/CM dated 06/07/2008  No prior chest CTs available.  FINDINGS: Right subclavian pacemaker leads extend into the right ventricle and coronary sinus. There is moderate cardiomegaly with asymmetric enlargement of the right cardiac chambers and the left atrium. There is central enlargement  of the pulmonary arteries. No acute pulmonary emboli are demonstrated. There is relatively mild atherosclerosis of the aorta, great vessels and coronary arteries.  There is a partially loculated pleural effusion on the right which extends into the fissures. There is a small dependent pleural effusion on the left. There are pleural calcifications bilaterally without demonstrated pleural-based mass. There is no significant pericardial effusion.  Numerous small mediastinal and hilar lymph nodes are not pathologically enlarged. There is no axillary adenopathy or chest wall abnormality. Asymmetric gynecomastia is noted on the left.  Advanced emphysematous changes are present in both lungs with  biapical bulla. There is atelectasis at both lung bases adjacent to the pleural effusions. In the left upper lobe, there is a somewhat spiculated nodule measuring up to 1.5 cm transverse on image 16. No other focal nodules are identified. There is no endobronchial lesion.  The visualized upper abdomen appears stable without suspicious findings. There are no worrisome osseous findings.  IMPRESSION: 1. Spiculated left upper lobe nodule is morphologically concerning for possible bronchogenic carcinoma. Given the patient's multiple other medical problems, consider chest CT follow-up in 4- 6 months to assess stability. PET-CT could be performed if clinically warranted. 2. Cardiomegaly with central enlargement of the pulmonary arteries consistent with pulmonary arterial hypertension. 3. Bilateral pleural effusions, partially loculated on the right. There is mild adjacent atelectasis, but no focal airspace disease. There underlying pleural calcifications bilaterally, but no evidence of pleural-based mass. 4. Emphysema and atherosclerosis noted.   Electronically Signed   By: Camie Patience M.D.   On: 08/13/2013 14:53    Cardiac Studies:  ECG:  SR RBBB biV paicing    Telemetry:  SR biV pacing with RBBB pattern  Echo:  EF 40%  Bioprosthetic MV  Current facility-administered medications:0.9 %  sodium chloride infusion, 250 mL, Intravenous, PRN, Rhonda G Barrett, PA-C;  acetaminophen (TYLENOL) tablet 650 mg, 650 mg, Oral, Q4H PRN, Rhonda G Barrett, PA-C;  ALPRAZolam (XANAX) tablet 0.25 mg, 0.25 mg, Oral, BID PRN, Rhonda G Barrett, PA-C;  furosemide (LASIX) tablet 40 mg, 40 mg, Oral, Daily, Oswald Hillock, MD, 40 mg at 08/13/13 1150 levofloxacin (LEVAQUIN) tablet 500 mg, 500 mg, Oral, Daily, Anh P Pham, RPH, 500 mg at 08/13/13 1157;  nitroGLYCERIN (NITROSTAT) SL tablet 0.4 mg, 0.4 mg, Sublingual, Q5 Min x 3 PRN, Rhonda G Barrett, PA-C;  ondansetron (ZOFRAN) injection 4 mg, 4 mg, Intravenous, Q6H PRN, Rhonda G Barrett,  PA-C;  sodium chloride 0.9 % injection 3 mL, 3 mL, Intravenous, Q12H, Rhonda G Barrett, PA-C, 3 mL at 08/13/13 2129 sodium chloride 0.9 % injection 3 mL, 3 mL, Intravenous, PRN, Rhonda G Barrett, PA-C;  zolpidem (AMBIEN) tablet 5 mg, 5 mg, Oral, QHS PRN, Evelene Croon Barrett, PA-C    Assessment/Plan:  Pulm:  Reviewed CT  He has a lot of chronic COPD scarring and airspace disease  Effusons loculated and would not be able to beDrained.  I discussed possible diagnosis of cancer with him  Would suggest pulmonary consult so he has outpatient f/u and PETScan would need to be done as outpatient  On Levaquin     MV Disease:  Bioprosthetic MV EF 40%  Appears euvolemic continue daily lasix  PAF:  IN NSR with biV pacing now no anticoagulation per Dr April Manson [facility] 08/14/2013, 9:32 AM

## 2013-08-14 NOTE — Progress Notes (Signed)
[facility] PROGRESS NOTE  [name] YSA:[id number] DOB: 03/21/1946 DOA: 08/12/2013 PCP: Joycelyn Man, MD  Assessment/Plan:  COPD/pulmonary nodule/Pulmonary hypertension/ CAP - Consult pulmonology next am for disposition planning regarding new pulmonary nodule - levaquin - supplemental oxygen - Dulera while in house    CAD (coronary artery disease) - stable continue statin    Atrial fibrillation Patient has had AV node ablation,He has ventricular pacing in the mode of biventricular pacing. He cannot be anticoagulated because of his history of a cerebral bleed    Cardiomyopathy - Cardiology on board. We will f/u with their recommendations.   Ejection fraction < 50%   ICD (implantable cardioverter-defibrillator), biventricular, in situ   Code Status: full Family Communication: no family at bedside Disposition Plan: Pending pulmonology evaluation next am and cardiology recommendations.   Consultants:  Cardiology  Procedures:  none  Antibiotics:  On levaquin  HPI/Subjective: No new complaints. Patient states that he feels about the same  Objective: Filed Vitals:   08/14/13 1404  BP: 113/70  Pulse: 71  Temp: 97.8 F (36.6 C)  Resp: 18    Intake/Output Summary (Last 24 hours) at 08/14/13 1832 Last data filed at 08/14/13 1726  Gross per 24 hour  Intake    720 ml  Output    725 ml  Net     -5 ml   Filed Weights   08/13/13 0504 08/14/13 0330  Weight: 96.7 kg (213 lb 3 oz) 96.3 kg (212 lb 4.9 oz)    Exam:   General:  In no acute distress, alert and awake  Cardiovascular: Normal S1 and S2 no murmurs  Respiratory: No wheezes, breathing comfortably with nasal cannula in place breath sounds heard bilaterally, speaking in full sentences  Abdomen: Soft, nondistended, nontender  Musculoskeletal: No cyanosis or clubbing   Data Reviewed: Basic Metabolic Panel:  Recent Labs Lab 08/12/13 1810 08/13/13 0715  NA 138 139  K 4.0 4.1  CL 96  98  CO2 25 28  GLUCOSE 164* 175*  BUN 25* 20  CREATININE 1.08 0.92  CALCIUM 8.5 8.3*   Liver Function Tests:  Recent Labs Lab 08/12/13 1810 08/13/13 0715  AST 15 14  ALT 14 15  ALKPHOS 61 59  BILITOT 2.9* 3.0*  PROT 6.3 6.2  ALBUMIN 3.4* 3.3*   No results found for this basename: LIPASE, AMYLASE,  in the last 168 hours No results found for this basename: AMMONIA,  in the last 168 hours CBC:  Recent Labs Lab 08/12/13 1810  WBC 8.1  HGB 16.4  HCT 48.7  MCV 92.2  PLT 197   Cardiac Enzymes:  Recent Labs Lab 08/12/13 1810 08/12/13 2302 08/13/13 0715  TROPONINI <0.30 <0.30 <0.30   BNP (last 3 results) No results found for this basename: PROBNP,  in the last 8760 hours CBG:  Recent Labs Lab 08/12/13 1741 08/13/13 1151  GLUCAP 166* 208*    Recent Results (from the past 240 hour(s))  CULTURE, BLOOD (ROUTINE X 2)     Status: None   Collection Time    08/12/13  5:52 PM      Result Value Range Status   Specimen Description BLOOD UPPER RIGHT ARM   Final   Special Requests BOTTLES DRAWN AEROBIC AND ANAEROBIC 10CC   Final   Culture  Setup Time     Final   Value: 08/13/2013 01:12     Performed at [facility]   Culture     Final   Value:  BLOOD CULTURE RECEIVED NO GROWTH TO DATE CULTURE WILL BE HELD FOR 5 DAYS BEFORE ISSUING A FINAL NEGATIVE REPORT     Performed at [facility]   Report Status PENDING   Incomplete  CULTURE, BLOOD (ROUTINE X 2)     Status: None   Collection Time    08/12/13  6:10 PM      Result Value Range Status   Specimen Description BLOOD LEFT UPPER ARM   Final   Special Requests BOTTLES DRAWN AEROBIC AND ANAEROBIC 10CC   Final   Culture  Setup Time     Final   Value: 08/13/2013 01:12     Performed at [facility]   Culture     Final   Value:        BLOOD CULTURE RECEIVED NO GROWTH TO DATE CULTURE WILL BE HELD FOR 5 DAYS BEFORE ISSUING A FINAL NEGATIVE REPORT     Performed at [facility]   Report  Status PENDING   Incomplete  MRSA PCR SCREENING     Status: None   Collection Time    08/12/13  8:45 PM      Result Value Range Status   MRSA by PCR NEGATIVE  NEGATIVE Final   Comment:            The GeneXpert MRSA Assay (FDA     approved for NASAL specimens     only), is one component of a     comprehensive MRSA colonization     surveillance program. It is not     intended to diagnose MRSA     infection nor to guide or     monitor treatment for     MRSA infections.     Studies: Dg Chest 2 View  08/12/2013   CLINICAL DATA:  Shortness of breath.  Hypoxia.  EXAM: CHEST  2 VIEW  COMPARISON:  PA and lateral chest 02/06/2012.  FINDINGS: There is cardiomegaly. There is extensive right middle and lower lobe airspace disease. Milder degree of airspace opacity is seen in the lingula. No pneumothorax identified. The chest is hyperexpanded. Pacing device in place.  IMPRESSION: Right much worse than left airspace disease likely reflects pneumonia rather than asymmetric edema. Followup films to clearing recommended.  Cardiomegaly.  Pulmonary hyperexpansion compatible with emphysema.   Electronically Signed   By: Inge Rise M.D.   On: 08/12/2013 22:33   Ct Chest W Contrast  08/13/2013   CLINICAL DATA:  Progressive shortness of breath. History of coronary artery disease, hypertension, CVA, COPD, atrial fibrillation and pulmonary hypertension.  EXAM: CT CHEST WITH CONTRAST  TECHNIQUE: Multidetector CT imaging of the chest was performed during intravenous contrast administration.  CONTRAST:  41mL OMNIPAQUE IOHEXOL 300 MG/ML  SOLN  COMPARISON:  DG CHEST 2 VIEW dated 08/12/2013; DG CHEST 2 VIEW dated 02/06/2012; CT ABD W/CM dated 06/07/2008  No prior chest CTs available.  FINDINGS: Right subclavian pacemaker leads extend into the right ventricle and coronary sinus. There is moderate cardiomegaly with asymmetric enlargement of the right cardiac chambers and the left atrium. There is central enlargement of the  pulmonary arteries. No acute pulmonary emboli are demonstrated. There is relatively mild atherosclerosis of the aorta, great vessels and coronary arteries.  There is a partially loculated pleural effusion on the right which extends into the fissures. There is a small dependent pleural effusion on the left. There are pleural calcifications bilaterally without demonstrated pleural-based mass. There is no significant pericardial effusion.  Numerous small  mediastinal and hilar lymph nodes are not pathologically enlarged. There is no axillary adenopathy or chest wall abnormality. Asymmetric gynecomastia is noted on the left.  Advanced emphysematous changes are present in both lungs with biapical bulla. There is atelectasis at both lung bases adjacent to the pleural effusions. In the left upper lobe, there is a somewhat spiculated nodule measuring up to 1.5 cm transverse on image 16. No other focal nodules are identified. There is no endobronchial lesion.  The visualized upper abdomen appears stable without suspicious findings. There are no worrisome osseous findings.  IMPRESSION: 1. Spiculated left upper lobe nodule is morphologically concerning for possible bronchogenic carcinoma. Given the patient's multiple other medical problems, consider chest CT follow-up in 4- 6 months to assess stability. PET-CT could be performed if clinically warranted. 2. Cardiomegaly with central enlargement of the pulmonary arteries consistent with pulmonary arterial hypertension. 3. Bilateral pleural effusions, partially loculated on the right. There is mild adjacent atelectasis, but no focal airspace disease. There underlying pleural calcifications bilaterally, but no evidence of pleural-based mass. 4. Emphysema and atherosclerosis noted.   Electronically Signed   By: Camie Patience M.D.   On: 08/13/2013 14:53    Scheduled Meds: . furosemide  40 mg Oral Daily  . levofloxacin  500 mg Oral Daily  . sodium chloride  3 mL Intravenous Q12H    Continuous Infusions:   Active Problems:   Time spent: > 35 minutes    Velvet Bathe  [facility] Pager [phone] If 7PM-7AM, please contact night-coverage at [email or web address], password [email or web address] 08/14/2013, 6:32 PM  LOS: 2 days

## 2013-08-15 ENCOUNTER — Observation Stay (HOSPITAL_COMMUNITY): Payer: Medicare Other

## 2013-08-15 DIAGNOSIS — I428 Other cardiomyopathies: Secondary | ICD-10-CM

## 2013-08-15 DIAGNOSIS — I2789 Other specified pulmonary heart diseases: Secondary | ICD-10-CM

## 2013-08-15 DIAGNOSIS — J9621 Acute and chronic respiratory failure with hypoxia: Secondary | ICD-10-CM

## 2013-08-15 DIAGNOSIS — R911 Solitary pulmonary nodule: Secondary | ICD-10-CM

## 2013-08-15 DIAGNOSIS — J438 Other emphysema: Principal | ICD-10-CM

## 2013-08-15 DIAGNOSIS — J9620 Acute and chronic respiratory failure, unspecified whether with hypoxia or hypercapnia: Secondary | ICD-10-CM

## 2013-08-15 DIAGNOSIS — J90 Pleural effusion, not elsewhere classified: Secondary | ICD-10-CM | POA: Diagnosis not present

## 2013-08-15 LAB — PRO B NATRIURETIC PEPTIDE
PRO B NATRI PEPTIDE: 1738 pg/mL — AB (ref 0–125)
Pro B Natriuretic peptide (BNP): 1743 pg/mL — ABNORMAL HIGH (ref 0–125)

## 2013-08-15 MED ORDER — FUROSEMIDE 10 MG/ML IJ SOLN
40.0000 mg | Freq: Two times a day (BID) | INTRAMUSCULAR | Status: AC
  Administered 2013-08-15 – 2013-08-16 (×2): 40 mg via INTRAVENOUS
  Filled 2013-08-15 (×2): qty 4

## 2013-08-15 MED ORDER — IPRATROPIUM BROMIDE 0.02 % IN SOLN: 0.5000 mg | Freq: Four times a day (QID) | RESPIRATORY_TRACT | Status: DC

## 2013-08-15 MED ORDER — POTASSIUM CHLORIDE CRYS ER 20 MEQ PO TBCR
40.0000 meq | EXTENDED_RELEASE_TABLET | Freq: Once | ORAL | Status: AC
  Administered 2013-08-15: 40 meq via ORAL
  Filled 2013-08-15: qty 2

## 2013-08-15 MED ORDER — ALBUTEROL SULFATE (2.5 MG/3ML) 0.083% IN NEBU
2.5000 mg | INHALATION_SOLUTION | RESPIRATORY_TRACT | Status: DC | PRN
  Administered 2013-08-15: 2.5 mg via RESPIRATORY_TRACT
  Filled 2013-08-15: qty 3

## 2013-08-15 MED ORDER — IPRATROPIUM-ALBUTEROL 0.5-2.5 (3) MG/3ML IN SOLN
3.0000 mL | Freq: Two times a day (BID) | RESPIRATORY_TRACT | Status: DC
  Administered 2013-08-16 – 2013-08-18 (×5): 3 mL via RESPIRATORY_TRACT
  Filled 2013-08-15 (×5): qty 3

## 2013-08-15 MED ORDER — IPRATROPIUM-ALBUTEROL 0.5-2.5 (3) MG/3ML IN SOLN: 3.0000 mL | Freq: Four times a day (QID) | RESPIRATORY_TRACT | Status: DC

## 2013-08-15 MED ORDER — BUDESONIDE 0.25 MG/2ML IN SUSP
0.2500 mg | Freq: Four times a day (QID) | RESPIRATORY_TRACT | Status: DC
  Administered 2013-08-16 – 2013-08-18 (×7): 0.25 mg via RESPIRATORY_TRACT
  Filled 2013-08-15 (×15): qty 2

## 2013-08-15 MED ORDER — ALBUTEROL SULFATE (2.5 MG/3ML) 0.083% IN NEBU: 2.5000 mg | INHALATION_SOLUTION | Freq: Four times a day (QID) | RESPIRATORY_TRACT | Status: DC

## 2013-08-15 NOTE — Progress Notes (Signed)
Patient changed to inpatient- con't to require supplemental O2 and requiring IV lasix.

## 2013-08-15 NOTE — Consult Note (Signed)
Name: [name] MRN: [id number] DOB: [birth year]    ADMISSION DATE:  08/12/2013 CONSULTATION DATE:  Darrall Dears  REFERRING MD :  Ron Parker  PRIMARY SERVICE:  [facility]ad  CHIEF COMPLAINT:  Hypoxia and left upper lobe lung mass   BRIEF PATIENT DESCRIPTION:  This is a 68 year old male w/ multiple medical comorbids, including significant cardiac history and chronic dyspnea. Admitted 2/2 from cardiology office where he reported for evaluation of chest pain. He was sent to [facility] after being found w/ resting O2 sats of 79% and working Dx of CAP. PCCM asked to see on 2/5 for persistent hypoxia and also new finding of LUL spiculated lung mass.    SIGNIFICANT EVENTS / STUDIES:  Echo 2/3: EF 30-35% akinesis of the apex, apical anterior, septal, inferior walls and hypokinesis of the mid and basal inferior and inferolateral wallsRight ventricle: The cavity size was mildly dilated.Systolic function was moderately reduced. Right atrium: The atrium was moderately dilated.   CULTURES: bcx2 2/2>>>  ANTIBIOTICS: levaquin 2/3>> 2/05  HISTORY OF PRESENT ILLNESS:   68 year old male w/ complex PMH:  of CAD (coronary artery disease); Hypertension; CVA (cerebral infarction); Endocarditis; Colon polyps; Dyslipidemia; COPD (chronic obstructive pulmonary disease); GERD (gastroesophageal reflux disease); Atrial fibrillation; Cardiomyopathy, Atrial septal defect; Mitral regurgitation; ICD (implantable cardiac defibrillator) battery depletion; Intracranial hemorrhage; Renal artery stenosis; Pulmonary hypertension; HLD (hyperlipidemia); Status post mitral valve replacement with bioprosthetic valve; and H/O atrioventricular nodal ablation.  Patient was seen at the cardiology office 2/1 for evaluation of CP, and was found to be hypoxic with O2 sats of 79% at rest. Patient has chronic shortness of breath, did not feel any different than baseline from a breathing standpoint. Did have a cough, this was non-productive . Chest x-ray showed  bilateral infiltrates right >  Left. Was admitted for possible CAP on 2/2. Diagnostics to date have included ECHO which showed decline in systolic fxn from 62-70% in dec 2014 to 30-35% currently, CT angio: neg for PE, CM w/ enlarged PAs, bilateral effusions (right appeared partially loculated), pulmonary edema, basilar atx and Left upper lobe lung mass. Treatment to date has included: empiric antibiotics, maintenance of home cardiac/diuretic regimen, and supplemental oxygen. As of 2/5 he has had little improvement in his dyspnea so PCCM was asked to see.    PAST MEDICAL HISTORY :  Past Medical History  Diagnosis Date  . CAD (coronary artery disease)     CABG was not done at that time mitral valve replacement done through lateral thoracotomy  . Hypertension   . Endocarditis     Bacterial, 2009  . Colon polyps   . Dyslipidemia   . COPD (chronic obstructive pulmonary disease)   . Atrial fibrillation     AV Node ablation January, 2010, for rapid atrial fib  . Cardiomyopathy     Ischemic  . Ejection fraction < 50%     EF 30%, echo, December, 2010, apex dyskinetic, moderate hypokinesis posterior wall and inferior wall  . Atrial septal defect     Closed with surgery January, 2010  . Mitral regurgitation     Severe,Mitral valve replacement  . Intracranial hemorrhage     Coumadin cannot be used because of the history of his bleed  . Renal artery stenosis     Mild by history  . Pulmonary hypertension     Moderate  . HLD (hyperlipidemia)   . Status post mitral valve replacement with bioprosthetic valve   . Automatic implantable cardioverter-defibrillator in situ  LV dysfunction and pacer needed for AV node lesion  . CVA (cerebral vascular accident) 2009    denies residual on 08/14/2013  . CHF (congestive heart failure)   . Myocardial infarction 2010  . Dysrhythmia   . Pre-diabetes   . Pneumonia     "this is my first case" (08/14/2013)   Past Surgical History  Procedure Laterality Date   . Mitral valve replacement  07/17/2008    Tissue valve / Echo, December, 2010, valve working well  . Cardiac defibrillator placement  ~ 2010    St Jude, Remote  . Penile prosthesis implant    . Appendectomy    . Mass biopsy Left     neck mass  . Leep    . Thoracotomy Right     right, for mitral valve replacement  . Cataract extraction w/ intraocular lens  implant, bilateral Bilateral   . Cardiac valve replacement    . Av node ablation  07/2008    for rapid atrial fib   Prior to Admission medications   Medication Sig Start Date End Date Taking? Authorizing Provider  aspirin EC 81 MG tablet Take 1 tablet (81 mg total) by mouth daily. 12/07/12  Yes Carlena Bjornstad, MD  atorvastatin (LIPITOR) 20 MG tablet Take 1 tablet (20 mg total) by mouth daily. 08/05/13  Yes Dorena Cookey, MD  carvedilol (COREG) 3.125 MG tablet Take 1 tablet (3.125 mg total) by mouth daily. 08/05/13  Yes Dorena Cookey, MD  Fluticasone-Salmeterol (ADVAIR) 500-50 MCG/DOSE AEPB Inhale 1 puff into the lungs every 12 (twelve) hours. 08/05/13  Yes Dorena Cookey, MD  furosemide (LASIX) 40 MG tablet Take 1 tablet (40 mg total) by mouth daily. 08/05/13  Yes Dorena Cookey, MD  nitroGLYCERIN (NITROSTAT) 0.4 MG SL tablet Place 1 tablet (0.4 mg total) under the tongue every 5 (five) minutes as needed. For chest pain 08/05/13  Yes Dorena Cookey, MD  nizatidine (AXID) 150 MG capsule Take 1 capsule (150 mg total) by mouth 2 (two) times daily. 08/05/13  Yes Dorena Cookey, MD  ramipril (ALTACE) 2.5 MG capsule Take 1 capsule (2.5 mg total) by mouth daily. 08/05/13  Yes Dorena Cookey, MD   Allergies  Allergen Reactions  . Anticoagulant Compound     Pt had intracranial bleed, therefore all anticoagulation is contra-indicated per Dr. Ron Parker.  . Warfarin Sodium     FAMILY HISTORY:  Family History  Problem Relation Age of Onset  . Stomach cancer Father   . Cancer Father   . Stroke      family history  . Stroke Mother    SOCIAL  HISTORY:  reports that he has quit smoking. His smoking use included Cigarettes. He has a 45 pack-year smoking history. He has never used smokeless tobacco. He reports that he drinks alcohol. He reports that he does not use illicit drugs. Review of Systems:   Bolds are positive  Constitutional: weight loss, gain, night sweats, Fevers, chills, fatigue .  HEENT: headaches, Sore throat, sneezing, nasal congestion, post nasal drip, Difficulty swallowing, Tooth/dental problems, visual complaints visual changes, ear ache CV:  chest pain, radiates: ,Orthopnea, PND, swelling in lower extremities, dizziness, palpitations, syncope.  GI  heartburn, indigestion, abdominal pain, nausea, vomiting, diarrhea, change in bowel habits, loss of appetite, bloody stools.  Resp: cough, resolved, productive: , hemoptysis, dyspnea, chest pain, pleuritic.  Skin: rash or itching or icterus GU: dysuria, change in color of urine, urgency or frequency. flank pain, hematuria  MS: joint pain or swelling. decreased range of motion  Psych: change in mood or affect. depression or anxiety.  Neuro: difficulty with speech, weakness, numbness, ataxia    SUBJECTIVE:  No acute distress  VITAL SIGNS: Temp:  [97.6 F (36.4 C)-98.2 F (36.8 C)] 98.2 F (36.8 C) (02/05 0609) Pulse Rate:  [71-74] 71 (02/05 0609) Resp:  [18] 18 (02/05 0609) BP: (102-115)/(63-72) 102/63 mmHg (02/05 0609) SpO2:  [90 %-92 %] 92 % (02/05 0838) Weight:  [96.1 kg (211 lb 13.8 oz)] 96.1 kg (211 lb 13.8 oz) (02/05 0609)  PHYSICAL EXAMINATION: General:  No distress.  Neuro:  Awake, alert, hard or hearing  HEENT:  [place], no JVD Cardiovascular:  rrr Lungs:  Crackles in bases  Abdomen:  Soft, non-tender  Musculoskeletal:  Intact  Skin:  Intact    Recent Labs Lab 08/12/13 1810 08/13/13 0715  NA 138 139  K 4.0 4.1  CL 96 98  CO2 25 28  BUN 25* 20  CREATININE 1.08 0.92  GLUCOSE 164* 175*    Recent Labs Lab 08/12/13 1810  HGB 16.4  HCT 48.7   WBC 8.1  PLT 197   CXR: CM, chronic appearing R pleural disease with small partially loculated effusion, probable emphysema  CT chest: Bullous emphysema, diffuse IS prominence, partially loculated R pleural effusion, minimal R pleural calcifications, very small L effusion, Marked bi-atrial enlargement as well as enlargement of PAs/LV/RV.   ASSESSMENT / PLAN New finding of hypoxemia Acute on chronic resp failure - multifactorial.  COPD, emphysema - by CT chest  40+ PY hx of smoking  No discernible benefit from Advair (has been on for several years) Chronic appearing pleural disease  Doubt significant contributor to his current symptoms Biventricular HF on basis of IHD and valvular disease Suspect severe PAH (on basis of findings of CT chest and Echo)  Secondary to LA hypertension, COPD and hypoxemia LUL oblong opacity worrisome for malignancy  DISC:  It is impossible to determine with certainty but I think the acute decompensation is probably related primarily to cardiac disease (evidenced by deterioration demonstrated on Echocardiogram). This might have triggered a chain reaction of interstitial edema > hypoxemia > worsening PAH, etc).   I don't find any convincing evidence of an acute infectious process.   There does not appear to be much evidence of reversible airflow obstruction  Other than treatment of underlying causes, there is no specific therapy for secondary PAH. Would not consider pulmonary vasodilators here  The LUL opacity is worrisome in appearance for malignancy but he is unlikely to ever be a candidate for aggressive eval and treatment of such. He certainly would not be benefited presently from making the dx of cancer.   Rec: -BNP and PCT ordered -Repeat CXR AM 2/06 -Maximize Rx of CHF/pulmonary edema - per Cards -D/C abx -Trial of nebulized BDs - if no benefit, DC. If seems to benefit, can set him up with budesonide/aformoterol as outpt -He will need long term  O2 therapy titrated to maintain SpO2 > 93 % (in setting of PAH) -He will need serial CT's of chest to follow LUL nodule. Repeat q3 months X 12 months, then q 6 months X 12 months. These can be limited, non-contrasted studies to minimize radiatio exposure. If at any time, the nodule increases in size, biopsy can be considered (a transthoracic approach would likely be feasible given its location)   Merton Border, MD ; [facility] service Mobile [phone].  After 5:30 PM or weekends, call [phone]  08/15/2013, 11:45 AM  \

## 2013-08-15 NOTE — Progress Notes (Signed)
Patient ambulated in hallway x1 assist with oxygen at 3-4 L [place], 150 feet. Back in room call bell within reach. Kadee Philyaw, Bettina Gavia RN

## 2013-08-15 NOTE — Progress Notes (Signed)
Patient ID: [name], male   DOB: [birth year], 68 y.o.   MRN: [id number]    Subjective:  No complaints  Oxygen down to 3 L needs portable oxygen to ambulate   Objective:  Filed Vitals:   08/14/13 1404 08/14/13 2100 08/15/13 0609 08/15/13 0838  BP: 113/70 115/72 102/63   Pulse: 71 74 71   Temp: 97.8 F (36.6 C) 97.6 F (36.4 C) 98.2 F (36.8 C)   TempSrc: Oral Oral Oral   Resp: 18 18 18    Weight:   211 lb 13.8 oz (96.1 kg)   SpO2: 92% 91% 90% 92%    Intake/Output from previous day:  Intake/Output Summary (Last 24 hours) at 08/15/13 6222 Last data filed at 08/15/13 0700  Gross per 24 hour  Intake    720 ml  Output    400 ml  Net    320 ml    Physical Exam: Affect appropriate Chronically ill white male  HEENT: normal Neck supple with no adenopathy JVP normal no bruits no thyromegaly Lungs Poor air movement no active wheezing decreased BS right base  Heart:  S1/S2 no murmur, no rub, gallop or click PMI normal Abdomen: benighn, BS positve, no tenderness, no AAA no bruit.  No HSM or HJR Distal pulses intact with no bruits No edema Neuro non-focal Skin warm and dry No muscular weakness   Lab Results: Basic Metabolic Panel:  Recent Labs  08/12/13 1810 08/13/13 0715  NA 138 139  K 4.0 4.1  CL 96 98  CO2 25 28  GLUCOSE 164* 175*  BUN 25* 20  CREATININE 1.08 0.92  CALCIUM 8.5 8.3*   Liver Function Tests:  Recent Labs  08/12/13 1810 08/13/13 0715  AST 15 14  ALT 14 15  ALKPHOS 61 59  BILITOT 2.9* 3.0*  PROT 6.3 6.2  ALBUMIN 3.4* 3.3*   CBC:  Recent Labs  08/12/13 1810  WBC 8.1  HGB 16.4  HCT 48.7  MCV 92.2  PLT 197   Cardiac Enzymes:  Recent Labs  08/12/13 1810 08/12/13 2302 08/13/13 0715  TROPONINI <0.30 <0.30 <0.30   Thyroid Function Tests:  Recent Labs  08/12/13 1810  TSH 2.257    Imaging: Dg Chest 2 View  08/15/2013   CLINICAL DATA:  Pneumonia versus CHF.  EXAM: CHEST  2 VIEW  COMPARISON:  CT, 08/13/2013 and chest  radiograph, 08/12/2013.  FINDINGS: Small bilateral pleural effusions, loculated on the right, are stable. There is interstitial thickening and fell areas of coarse reticular opacity in the lungs. Hazy airspace type opacity this also noted in the lower lungs. Stable focal opacity in the left upper lobe reflects the spiculated nodule noted on the current CT.  Cardiac silhouette is enlarged. Mediastinum is normal in contour. There is enlargement of both pulmonary arteries.  Right anterior chest wall sequential pacemaker is stable in well positioned.  IMPRESSION: 1. No change from the previous chest radiograph. 2. Interstitial thickening may be chronic. A component of interstitial edema is possible. No convincing pneumonia. 3. Left upper lobe mass as detailed on the previous CT. 4. Small pleural effusions, loculated on the right.   Electronically Signed   By: Lajean Manes M.D.   On: 08/15/2013 09:45   Ct Chest W Contrast  08/13/2013   CLINICAL DATA:  Progressive shortness of breath. History of coronary artery disease, hypertension, CVA, COPD, atrial fibrillation and pulmonary hypertension.  EXAM: CT CHEST WITH CONTRAST  TECHNIQUE: Multidetector CT imaging of the chest was performed  during intravenous contrast administration.  CONTRAST:  2mL OMNIPAQUE IOHEXOL 300 MG/ML  SOLN  COMPARISON:  DG CHEST 2 VIEW dated 08/12/2013; DG CHEST 2 VIEW dated 02/06/2012; CT ABD W/CM dated 06/07/2008  No prior chest CTs available.  FINDINGS: Right subclavian pacemaker leads extend into the right ventricle and coronary sinus. There is moderate cardiomegaly with asymmetric enlargement of the right cardiac chambers and the left atrium. There is central enlargement of the pulmonary arteries. No acute pulmonary emboli are demonstrated. There is relatively mild atherosclerosis of the aorta, great vessels and coronary arteries.  There is a partially loculated pleural effusion on the right which extends into the fissures. There is a small  dependent pleural effusion on the left. There are pleural calcifications bilaterally without demonstrated pleural-based mass. There is no significant pericardial effusion.  Numerous small mediastinal and hilar lymph nodes are not pathologically enlarged. There is no axillary adenopathy or chest wall abnormality. Asymmetric gynecomastia is noted on the left.  Advanced emphysematous changes are present in both lungs with biapical bulla. There is atelectasis at both lung bases adjacent to the pleural effusions. In the left upper lobe, there is a somewhat spiculated nodule measuring up to 1.5 cm transverse on image 16. No other focal nodules are identified. There is no endobronchial lesion.  The visualized upper abdomen appears stable without suspicious findings. There are no worrisome osseous findings.  IMPRESSION: 1. Spiculated left upper lobe nodule is morphologically concerning for possible bronchogenic carcinoma. Given the patient's multiple other medical problems, consider chest CT follow-up in 4- 6 months to assess stability. PET-CT could be performed if clinically warranted. 2. Cardiomegaly with central enlargement of the pulmonary arteries consistent with pulmonary arterial hypertension. 3. Bilateral pleural effusions, partially loculated on the right. There is mild adjacent atelectasis, but no focal airspace disease. There underlying pleural calcifications bilaterally, but no evidence of pleural-based mass. 4. Emphysema and atherosclerosis noted.   Electronically Signed   By: Camie Patience M.D.   On: 08/13/2013 14:53    Cardiac Studies:  ECG:  SR RBBB biV paicing    Telemetry:  SR biV pacing with RBBB pattern  Echo:  EF 40%  Bioprosthetic MV  Current facility-administered medications:0.9 %  sodium chloride infusion, 250 mL, Intravenous, PRN, Rhonda G Barrett, PA-C;  acetaminophen (TYLENOL) tablet 650 mg, 650 mg, Oral, Q4H PRN, Rhonda G Barrett, PA-C;  ALPRAZolam (XANAX) tablet 0.25 mg, 0.25 mg, Oral,  BID PRN, Rhonda G Barrett, PA-C;  atorvastatin (LIPITOR) tablet 20 mg, 20 mg, Oral, Daily, Velvet Bathe, MD, 20 mg at 08/14/13 2020 furosemide (LASIX) tablet 40 mg, 40 mg, Oral, Daily, Oswald Hillock, MD, 40 mg at 08/14/13 1004;  levofloxacin (LEVAQUIN) tablet 500 mg, 500 mg, Oral, Daily, Anh P Pham, RPH, 500 mg at 08/14/13 1005;  mometasone-formoterol (DULERA) 200-5 MCG/ACT inhaler 2 puff, 2 puff, Inhalation, BID, Velvet Bathe, MD, 2 puff at 08/15/13 9833;  nitroGLYCERIN (NITROSTAT) SL tablet 0.4 mg, 0.4 mg, Sublingual, Q5 Min x 3 PRN, Rhonda G Barrett, PA-C ondansetron (ZOFRAN) injection 4 mg, 4 mg, Intravenous, Q6H PRN, Rhonda G Barrett, PA-C;  sodium chloride 0.9 % injection 3 mL, 3 mL, Intravenous, Q12H, Rhonda G Barrett, PA-C, 3 mL at 08/14/13 2215;  sodium chloride 0.9 % injection 3 mL, 3 mL, Intravenous, PRN, Rhonda G Barrett, PA-C;  zolpidem (AMBIEN) tablet 5 mg, 5 mg, Oral, QHS PRN, Evelene Croon Barrett, PA-C    Assessment/Plan:  Pulm:  Reviewed CT  He has a lot of chronic COPD scarring  and airspace disease  Effusons loculated and would not be able to be drained.  I discussed possible diagnosis of cancer with him  Called Dr Alva Garnet for consult today and Dr Ron Parker had already spoken with him.  Spoke with nurse to ambulate and check sats to see if he needs home oxygen.  CXR today looks same or worse to me More apparent bilateral disease.  Will check BNP but clinically volume is ok    MV Disease:  Bioprosthetic MV EF 40%  Appears euvolemic continue daily lasix  PAF:  IN NSR with biV pacing now no anticoagulation per Dr April Manson [facility] 08/15/2013, 9:51 AM

## 2013-08-15 NOTE — Care Management Note (Unsigned)
    Page 1 of 1   08/15/2013     4:04:35 PM   CARE MANAGEMENT NOTE 08/15/2013  Patient:  [name], [name]   Account Number:  [id number]  Date Initiated:  08/15/2013  Documentation initiated by:  [name] Beckles  Subjective/Objective Assessment:   PT ADM ON 08/12/13 WITH HYPOXIA/PNA WITH COPD.  PTA, PT INDEPENDENT,LIVES AT HOME WITH SPOUSE.     Action/Plan:   WILL FOLLOW FOR DC NEEDS AS PT PROGRESSES.  MAY NEED HOME O2 UPON DC.   Anticipated DC Date:  08/17/2013   Anticipated DC Plan:  [place]  CM consult      Choice offered to / List presented to:             Status of service:  In process, will continue to follow Medicare Important Message given?   (If response is "NO", the following Medicare IM given date fields will be blank) Date Medicare IM given:   Date Additional Medicare IM given:    Discharge Disposition:    Per UR Regulation:  Reviewed for med. necessity/level of care/duration of stay  If discussed at [place] of Stay Meetings, dates discussed:    Comments:

## 2013-08-15 NOTE — Progress Notes (Signed)
SATURATION QUALIFICATIONS: (This note is used to comply with regulatory documentation for home oxygen)  Patient Saturations on Room Air at Rest = 90  Patient Saturations on Room Air while Ambulating = 82  Patient Saturations on 4 Liters of oxygen while Ambulating = 90  Please briefly explain why patient needs home oxygen:Patients oxygen saturation on room air dipped to as low as 82 on room air while ambulating, took up to 4L [place] of oxygen and a few minutes for 02 sat to rise to 90-91. Patient slighly short winded. Back in room on 02 at 3L at rest sat 94% on 3L resting. Kree Rafter, Bettina Gavia RN

## 2013-08-15 NOTE — Progress Notes (Signed)
[facility] PROGRESS NOTE  [name] BTD:[id number] DOB: 1945-09-20 DOA: 08/12/2013 PCP: Joycelyn Man, MD  Assessment/Plan:  COPD/pulmonary nodule/Pulmonary hypertension/ CAP - Pulmonology consult: check BNP and PCT, escalate diuresis, f/u CXR and if clear perform inpatient PFTs - re-image pulmonary nodule as recommended by Pulmonologist: Repeat q3 months X 12 months, then q 6 months X 12 months. These can be limited, non-contrasted studies to minimize radiatio exposure. If at any time, the nodule increases in size, biopsy can be considered (a transthoracic approach would likely be feasible given its location - Agree with discontinuing antibiotic given lack of fever and normal white blood cell count - requiring supplemental oxygen - Dulera while in house    CAD (coronary artery disease) - stable continue statin    Atrial fibrillation - Patient has had AV node ablation, He is biventricular paced.  - He cannot be anticoagulated because of his history of a cerebral bleed    Cardiomyopathy - Cardiology on board. We will f/u with their recommendations. -  Ejection fraction < 50% -  ICD (implantable cardioverter-defibrillator), biventricular, in situ   Code Status: full Family Communication: Discussed plan of care with patient and wife. Disposition Plan: Pending pulmonology and cardiology recommendations.   Consultants:  Cardiology  Pulmonology  Procedures:  none  Antibiotics:  On levaquin- day 3  HPI/Subjective: No new complaints. Reports persistent dry cough. Patient states that he feels about the same.   Objective: Filed Vitals:   08/15/13 1411  BP: 114/71  Pulse: 73  Temp: 97.3 F (36.3 C)  Resp: 18    Intake/Output Summary (Last 24 hours) at 08/15/13 1439 Last data filed at 08/15/13 1300  Gross per 24 hour  Intake    723 ml  Output      0 ml  Net    723 ml   Filed Weights   08/13/13 0504 08/14/13 0330 08/15/13 0609  Weight: 96.7 kg  (213 lb 3 oz) 96.3 kg (212 lb 4.9 oz) 96.1 kg (211 lb 13.8 oz)    Exam:   General:  In no acute distress, alert and awake  Cardiovascular: NSR, biventricular pacing, no MGR, + dry cough  Respiratory: No wheezes, breathing comfortably with nasal cannula in place breath sounds clear bilaterally, speaking in full sentences  Abdomen: Soft, nondistended, nontender  Musculoskeletal: No cyanosis or clubbing. +1 edema  Data Reviewed: Basic Metabolic Panel:  Recent Labs Lab 08/12/13 1810 08/13/13 0715  NA 138 139  K 4.0 4.1  CL 96 98  CO2 25 28  GLUCOSE 164* 175*  BUN 25* 20  CREATININE 1.08 0.92  CALCIUM 8.5 8.3*   Liver Function Tests:  Recent Labs Lab 08/12/13 1810 08/13/13 0715  AST 15 14  ALT 14 15  ALKPHOS 61 59  BILITOT 2.9* 3.0*  PROT 6.3 6.2  ALBUMIN 3.4* 3.3*   No results found for this basename: LIPASE, AMYLASE,  in the last 168 hours No results found for this basename: AMMONIA,  in the last 168 hours CBC:  Recent Labs Lab 08/12/13 1810  WBC 8.1  HGB 16.4  HCT 48.7  MCV 92.2  PLT 197   Cardiac Enzymes:  Recent Labs Lab 08/12/13 1810 08/12/13 2302 08/13/13 0715  TROPONINI <0.30 <0.30 <0.30   BNP (last 3 results)  Recent Labs  08/15/13 1205  PROBNP 1743.0*   CBG:  Recent Labs Lab 08/12/13 1741 08/13/13 1151  GLUCAP 166* 208*    Recent Results (from the past 240 hour(s))  CULTURE, BLOOD (  ROUTINE X 2)     Status: None   Collection Time    08/12/13  5:52 PM      Result Value Range Status   Specimen Description BLOOD UPPER RIGHT ARM   Final   Special Requests BOTTLES DRAWN AEROBIC AND ANAEROBIC 10CC   Final   Culture  Setup Time     Final   Value: 08/13/2013 01:12     Performed at [facility]   Culture     Final   Value:        BLOOD CULTURE RECEIVED NO GROWTH TO DATE CULTURE WILL BE HELD FOR 5 DAYS BEFORE ISSUING A FINAL NEGATIVE REPORT     Performed at [facility]   Report Status PENDING   Incomplete   CULTURE, BLOOD (ROUTINE X 2)     Status: None   Collection Time    08/12/13  6:10 PM      Result Value Range Status   Specimen Description BLOOD LEFT UPPER ARM   Final   Special Requests BOTTLES DRAWN AEROBIC AND ANAEROBIC 10CC   Final   Culture  Setup Time     Final   Value: 08/13/2013 01:12     Performed at [facility]   Culture     Final   Value:        BLOOD CULTURE RECEIVED NO GROWTH TO DATE CULTURE WILL BE HELD FOR 5 DAYS BEFORE ISSUING A FINAL NEGATIVE REPORT     Performed at [facility]   Report Status PENDING   Incomplete  MRSA PCR SCREENING     Status: None   Collection Time    08/12/13  8:45 PM      Result Value Range Status   MRSA by PCR NEGATIVE  NEGATIVE Final   Comment:            The GeneXpert MRSA Assay (FDA     approved for NASAL specimens     only), is one component of a     comprehensive MRSA colonization     surveillance program. It is not     intended to diagnose MRSA     infection nor to guide or     monitor treatment for     MRSA infections.     Studies: Dg Chest 2 View  08/15/2013   CLINICAL DATA:  Pneumonia versus CHF.  EXAM: CHEST  2 VIEW  COMPARISON:  CT, 08/13/2013 and chest radiograph, 08/12/2013.  FINDINGS: Small bilateral pleural effusions, loculated on the right, are stable. There is interstitial thickening and fell areas of coarse reticular opacity in the lungs. Hazy airspace type opacity this also noted in the lower lungs. Stable focal opacity in the left upper lobe reflects the spiculated nodule noted on the current CT.  Cardiac silhouette is enlarged. Mediastinum is normal in contour. There is enlargement of both pulmonary arteries.  Right anterior chest wall sequential pacemaker is stable in well positioned.  IMPRESSION: 1. No change from the previous chest radiograph. 2. Interstitial thickening may be chronic. A component of interstitial edema is possible. No convincing pneumonia. 3. Left upper lobe mass as detailed on the  previous CT. 4. Small pleural effusions, loculated on the right.   Electronically Signed   By: Lajean Manes M.D.   On: 08/15/2013 09:45    Scheduled Meds: . atorvastatin  20 mg Oral Daily  . furosemide  40 mg Intravenous BID  . furosemide  40 mg Oral Daily  .  mometasone-formoterol  2 puff Inhalation BID  . sodium chloride  3 mL Intravenous Q12H   Continuous Infusions:   Active Problems:   Time spent: > 35 minutes    Larwance Sachs  [facility] Pager [phone] If 7PM-7AM, please contact night-coverage at [email or web address], password [facility] 08/15/2013, 2:39 PM  LOS: 3 days

## 2013-08-15 NOTE — Progress Notes (Signed)
No pulmicort available at this time. Pt states he has not been having difficulty breathing while sitting or on exertion. No noticeable difference in breathing after tx.

## 2013-08-16 ENCOUNTER — Inpatient Hospital Stay (HOSPITAL_COMMUNITY): Payer: Medicare Other

## 2013-08-16 ENCOUNTER — Encounter (HOSPITAL_COMMUNITY)
Admission: AD | Disposition: A | Discharge status: Home / Self Care | Payer: Self-pay | Source: Ambulatory Visit | Attending: Family Medicine

## 2013-08-16 ENCOUNTER — Ambulatory Visit (HOSPITAL_COMMUNITY): Admit: 2013-08-16 | Payer: Self-pay | Admitting: Cardiovascular Disease

## 2013-08-16 DIAGNOSIS — J4489 Other specified chronic obstructive pulmonary disease: Secondary | ICD-10-CM

## 2013-08-16 DIAGNOSIS — J449 Chronic obstructive pulmonary disease, unspecified: Secondary | ICD-10-CM

## 2013-08-16 DIAGNOSIS — R911 Solitary pulmonary nodule: Secondary | ICD-10-CM

## 2013-08-16 DIAGNOSIS — I279 Pulmonary heart disease, unspecified: Secondary | ICD-10-CM

## 2013-08-16 HISTORY — PX: RIGHT HEART CATHETERIZATION: SHX5447

## 2013-08-16 LAB — POCT I-STAT 3, VENOUS BLOOD GAS (G3P V)
ACID-BASE EXCESS: 6 mmol/L — AB (ref 0.0–2.0)
Bicarbonate: 32.4 mEq/L — ABNORMAL HIGH (ref 20.0–24.0)
O2 SAT: 51 %
TCO2: 34 mmol/L (ref 0–100)
pCO2, Ven: 52.6 mmHg — ABNORMAL HIGH (ref 45.0–50.0)
pH, Ven: 7.398 — ABNORMAL HIGH (ref 7.250–7.300)
pO2, Ven: 28 mmHg — CL (ref 30.0–45.0)

## 2013-08-16 LAB — BASIC METABOLIC PANEL
BUN: 17 mg/dL (ref 6–23)
CO2: 33 mEq/L — ABNORMAL HIGH (ref 19–32)
CREATININE: 1.01 mg/dL (ref 0.50–1.35)
Calcium: 9.2 mg/dL (ref 8.4–10.5)
Chloride: 94 mEq/L — ABNORMAL LOW (ref 96–112)
GFR calc Af Amer: 87 mL/min — ABNORMAL LOW (ref >90)
GFR, EST NON AFRICAN AMERICAN: 75 mL/min — AB (ref >90)
Glucose, Bld: 287 mg/dL — ABNORMAL HIGH (ref 70–99)
Potassium: 4.3 mEq/L (ref 3.7–5.3)
SODIUM: 138 meq/L (ref 137–147)

## 2013-08-16 SURGERY — RIGHT HEART CATH
Anesthesia: LOCAL

## 2013-08-16 MED ORDER — SODIUM CHLORIDE 0.9 % IJ SOLN: 3.0000 mL | INTRAMUSCULAR | Status: DC | PRN

## 2013-08-16 MED ORDER — SODIUM CHLORIDE 0.9 % IJ SOLN
3.0000 mL | Freq: Two times a day (BID) | INTRAMUSCULAR | Status: DC
Start: 2013-08-16 — End: 2013-08-18
  Administered 2013-08-17 – 2013-08-18 (×2): 3 mL via INTRAVENOUS

## 2013-08-16 MED ORDER — ACETAMINOPHEN 325 MG PO TABS: 650.0000 mg | ORAL_TABLET | ORAL | Status: DC | PRN

## 2013-08-16 MED ORDER — HEPARIN (PORCINE) IN NACL 2-0.9 UNIT/ML-% IJ SOLN
INTRAMUSCULAR | Status: AC
  Filled 2013-08-16: qty 1000

## 2013-08-16 MED ORDER — ISOSORBIDE DINITRATE 10 MG PO TABS
10.0000 mg | ORAL_TABLET | Freq: Three times a day (TID) | ORAL | Status: DC
  Administered 2013-08-16 – 2013-08-18 (×5): 10 mg via ORAL
  Filled 2013-08-16 (×9): qty 1

## 2013-08-16 MED ORDER — ONDANSETRON HCL 4 MG/2ML IJ SOLN: 4.0000 mg | Freq: Four times a day (QID) | INTRAMUSCULAR | Status: DC | PRN

## 2013-08-16 MED ORDER — OXYCODONE-ACETAMINOPHEN 5-325 MG PO TABS: 1.0000 | ORAL_TABLET | ORAL | Status: DC | PRN

## 2013-08-16 MED ORDER — ASPIRIN 81 MG PO CHEW: 81.0000 mg | CHEWABLE_TABLET | ORAL | Status: DC

## 2013-08-16 MED ORDER — SODIUM CHLORIDE 0.9 % IJ SOLN: 3.0000 mL | Freq: Two times a day (BID) | INTRAMUSCULAR | Status: DC

## 2013-08-16 MED ORDER — LIDOCAINE HCL (PF) 1 % IJ SOLN
INTRAMUSCULAR | Status: AC
  Filled 2013-08-16: qty 30

## 2013-08-16 MED ORDER — SODIUM CHLORIDE 0.9 % IV SOLN: 250.0000 mL | INTRAVENOUS | Status: DC | PRN

## 2013-08-16 MED ORDER — MIDAZOLAM HCL 2 MG/2ML IJ SOLN
INTRAMUSCULAR | Status: AC
  Filled 2013-08-16: qty 2

## 2013-08-16 MED ORDER — BENZONATATE 100 MG PO CAPS
100.0000 mg | ORAL_CAPSULE | Freq: Three times a day (TID) | ORAL | Status: DC | PRN
  Administered 2013-08-16: 100 mg via ORAL
  Filled 2013-08-16: qty 1

## 2013-08-16 MED ORDER — SODIUM CHLORIDE 0.9 % IV SOLN: 1.0000 mL/kg/h | INTRAVENOUS | Status: DC

## 2013-08-16 NOTE — Interval H&P Note (Signed)
History and Physical Interval Note:  08/16/2013 11:39 AM  [name]  has presented today for surgery, with the diagnosis of cp  The various methods of treatment have been discussed with the patient and family. After consideration of risks, benefits and other options for treatment, the patient has consented to  Procedure(s): RIGHT HEART CATH (N/A) as a surgical intervention .  The patient's history has been reviewed, patient examined, no change in status, stable for surgery.  I have reviewed the patient's chart and labs.  Questions were answered to the patient's satisfaction.     [name]

## 2013-08-16 NOTE — Progress Notes (Signed)
Patient ID: [name], male   DOB: 1946-05-17, 68 y.o.   MRN: [id number]    Subjective:  Requires 4L to keep sats over 90% with ambulation No cough fever or sputum   Objective:  Filed Vitals:   08/15/13 0838 08/15/13 1411 08/15/13 1948 08/16/13 0624  BP:  114/71 108/50 113/68  Pulse:  73 71 77  Temp:  97.3 F (36.3 C) 97.8 F (36.6 C) 97.4 F (36.3 C)  TempSrc:  Oral Oral Oral  Resp:  18 18 18   Height: 6\' 2"  (1.88 m)     Weight:    208 lb 8 oz (94.575 kg)  SpO2: 92% 90% 93% 93%    Intake/Output from previous day:  Intake/Output Summary (Last 24 hours) at 08/16/13 1021 Last data filed at 08/16/13 1000  Gross per 24 hour  Intake    480 ml  Output   2900 ml  Net  -2420 ml    Physical Exam: Affect appropriate Chronically ill white male  HEENT: normal Neck supple with no adenopathy JVP normal no bruits no thyromegaly Lungs Poor air movement no active wheezing decreased BS right base  Heart:  S1/S2 no murmur, no rub, gallop or click PMI normal Abdomen: benighn, BS positve, no tenderness, no AAA no bruit.  No HSM or HJR Distal pulses intact with no bruits No edema Neuro non-focal Skin warm and dry No muscular weakness   Lab Results:  Imaging: Dg Chest 2 View  08/16/2013   CLINICAL DATA:  Follow-up pleural effusion.  Weakness.  Edema.  EXAM: CHEST  2 VIEW  COMPARISON:  08/15/2013  FINDINGS: Right subclavian dual lead pacemaker is unchanged. Enlargement of the cardiac silhouette is unchanged. Partially loculated right pleural effusion does not appear significantly changed. There may be trace left pleural fluid. Pulmonary vascular congestion has decreased. Coarse interstitial opacities remain bilaterally. Hazy airspace opacities in the right lower lung have improved. Spiculated left upper lobe nodule is unchanged. No acute osseous abnormality is seen.  IMPRESSION: 1. Mildly improved vascular congestion and right lower lobe airspace opacity, suggestive of improved  edema. 2. Unchanged, loculated right pleural effusion. Possible trace residual left pleural effusion.   Electronically Signed   By: Logan Bores   On: 08/16/2013 07:51   Dg Chest 2 View  08/15/2013   CLINICAL DATA:  Pneumonia versus CHF.  EXAM: CHEST  2 VIEW  COMPARISON:  CT, 08/13/2013 and chest radiograph, 08/12/2013.  FINDINGS: Small bilateral pleural effusions, loculated on the right, are stable. There is interstitial thickening and fell areas of coarse reticular opacity in the lungs. Hazy airspace type opacity this also noted in the lower lungs. Stable focal opacity in the left upper lobe reflects the spiculated nodule noted on the current CT.  Cardiac silhouette is enlarged. Mediastinum is normal in contour. There is enlargement of both pulmonary arteries.  Right anterior chest wall sequential pacemaker is stable in well positioned.  IMPRESSION: 1. No change from the previous chest radiograph. 2. Interstitial thickening may be chronic. A component of interstitial edema is possible. No convincing pneumonia. 3. Left upper lobe mass as detailed on the previous CT. 4. Small pleural effusions, loculated on the right.   Electronically Signed   By: Lajean Manes M.D.   On: 08/15/2013 09:45    Cardiac Studies:  ECG:  SR RBBB biV paicing    Telemetry:  SR biV pacing with RBBB pattern  Echo:  EF 40%  Bioprosthetic MV  No current facility-administered medications on file  prior to encounter.   Current Outpatient Prescriptions on File Prior to Encounter  Medication Sig Dispense Refill  . aspirin EC 81 MG tablet Take 1 tablet (81 mg total) by mouth daily.  90 tablet  3  . atorvastatin (LIPITOR) 20 MG tablet Take 1 tablet (20 mg total) by mouth daily.  90 tablet  0  . carvedilol (COREG) 3.125 MG tablet Take 1 tablet (3.125 mg total) by mouth daily.  90 tablet  3  . Fluticasone-Salmeterol (ADVAIR) 500-50 MCG/DOSE AEPB Inhale 1 puff into the lungs every 12 (twelve) hours.  60 each  10  . furosemide (LASIX)  40 MG tablet Take 1 tablet (40 mg total) by mouth daily.  100 tablet  3  . nitroGLYCERIN (NITROSTAT) 0.4 MG SL tablet Place 1 tablet (0.4 mg total) under the tongue every 5 (five) minutes as needed. For chest pain  25 tablet  1  . nizatidine (AXID) 150 MG capsule Take 1 capsule (150 mg total) by mouth 2 (two) times daily.  200 capsule  3  . ramipril (ALTACE) 2.5 MG capsule Take 1 capsule (2.5 mg total) by mouth daily.  100 capsule  3       Assessment/Plan:  Pulm:  Reviewed CT  He has a lot of chronic COPD scarring and airspace disease  Effusons loculated and would not be able to be drained.  I discussed possible diagnosis of cancer with him  Still requiring high oxygen.  BNP elevated but really not that much in 1700 range. Appreciate Pulmonary note Agree with stopping antibiotics but not about vasodilators.  Will arrange right heart cath today to see what PA and PCWP are with current Rx som we can optimize and hopefully d/c over weekend   [name] 08/16/2013, 10:21 AM

## 2013-08-16 NOTE — Progress Notes (Signed)
Name: STEFANO TRULSON MRN: [id number] DOB: [birth year]    ADMISSION DATE:  08/12/2013 CONSULTATION DATE:  Darrall Dears  REFERRING MD :  Ron Parker  PRIMARY SERVICE:  [facility]  CHIEF COMPLAINT:  Hypoxia and left upper lobe lung mass   BRIEF PATIENT DESCRIPTION:  This is a 68 year old male w/ multiple medical comorbids, including significant cardiac history and chronic dyspnea. Admitted 2/2 from cardiology office where he reported for evaluation of chest pain. He was sent to [facility] after being found w/ resting O2 sats of 79% and working Dx of CAP. PCCM asked to see on 2/5 for persistent hypoxia and also new finding of LUL spiculated lung mass.    SIGNIFICANT EVENTS / STUDIES:  Echo 2/3: EF 30-35% akinesis of the apex, apical anterior, septal, inferior walls and hypokinesis of the mid and basal inferior and inferolateral wallsRight ventricle: The cavity size was mildly dilated.Systolic function was moderately reduced. Right atrium: The atrium was moderately dilated. Cath 2/6 PCWP 25, Mean RA 11 mmHg  RV 54/5 mmHg  PA: 56/29 mean 39 mmHg CO: 3.39     CULTURES: bcx2 2/2>>>ng  ANTIBIOTICS: levaquin 2/3>> 2/05  SUBJECTIVE:  No acute distress  VITAL SIGNS: Temp:  [97.3 F (36.3 C)-97.8 F (36.6 C)] 97.4 F (36.3 C) (02/06 0624) Pulse Rate:  [71-77] 77 (02/06 0624) Resp:  [18] 18 (02/06 0624) BP: (108-114)/(50-71) 113/68 mmHg (02/06 0624) SpO2:  [90 %-93 %] 93 % (02/06 0624) Weight:  [94.575 kg (208 lb 8 oz)] 94.575 kg (208 lb 8 oz) (02/06 0624)  PHYSICAL EXAMINATION: General:  No distress.  Neuro:  Awake, alert, hard or hearing  HEENT:  Somers, no JVD Cardiovascular:  rrr Lungs:  Crackles in bases  Abdomen:  Soft, non-tender  Musculoskeletal:  Intact  Skin:  Intact    Recent Labs Lab 08/12/13 1810 08/13/13 0715  NA 138 139  K 4.0 4.1  CL 96 98  CO2 25 28  BUN 25* 20  CREATININE 1.08 0.92  GLUCOSE 164* 175*    Recent Labs Lab 08/12/13 1810  HGB 16.4  HCT 48.7  WBC 8.1  PLT 197     CXR: loculated chronic right effusion. Otherwise improved aeration s/p diuresis   CT chest: Bullous emphysema, diffuse IS prominence, partially loculated R pleural effusion, minimal R pleural calcifications, very small L effusion, Marked bi-atrial enlargement as well as enlargement of PAs/LV/RV.   ASSESSMENT / PLAN New finding of hypoxemia Acute on chronic resp failure - multifactorial.   -Think the acute decompensation is probably related primarily to cardiac disease (evidenced by deterioration demonstrated on Echocardiogram). This might have triggered a chain reaction of interstitial edema > hypoxemia > worsening PAH, etc). Has tolerated stopping abx and seems to be improving.    -Other than treatment of underlying causes, there is no specific therapy for secondary PAH. Would not consider pulmonary vasodilators here COPD, emphysema - by CT chest  40+ PY hx of smoking  No discernible benefit from Advair (has been on for several years) Chronic appearing pleural disease  -Doubt significant contributor to his current symptoms Biventricular HF on basis of IHD and valvular disease Suspect severe PAH (on basis of findings of CT chest and Echo)  -Secondary to LA hypertension, COPD and hypoxemia LUL oblong opacity worrisome for malignancy  - worrisome in appearance for malignancy   Rec: -Maximize Rx of CHF/pulmonary edema - per Cards -Trial of nebulized BDs - if no benefit, DC. If seems to benefit, can set him up  with budesonide/aformoterol as outpt -He will need long term O2 therapy titrated to maintain SpO2 > 93 % (in setting of PAH) -He will need serial CT's of chest to follow LUL nodule. Repeat CT in 3 months  These can be limited, non-contrasted studies to minimize radiatio exposure. If at any time, the nodule increases in size, biopsy can be considered (a transthoracic approach would likely be feasible given its location)  Pl arrange Fu with Tammy parrett , NP on discharge in 4-6  wks  pCCM to sign off  ALVA,RAKESH V. [phone]  08/16/2013, 9:30 AM  \

## 2013-08-16 NOTE — H&P (View-Only) (Signed)
Patient ID: [name], male   DOB: 25-May-1946, 68 y.o.   MRN: [id number]    Subjective:  Requires 4L to keep sats over 90% with ambulation No cough fever or sputum   Objective:  Filed Vitals:   08/15/13 0838 08/15/13 1411 08/15/13 1948 08/16/13 0624  BP:  114/71 108/50 113/68  Pulse:  73 71 77  Temp:  97.3 F (36.3 C) 97.8 F (36.6 C) 97.4 F (36.3 C)  TempSrc:  Oral Oral Oral  Resp:  18 18 18   Height: 6\' 2"  (1.88 m)     Weight:    208 lb 8 oz (94.575 kg)  SpO2: 92% 90% 93% 93%    Intake/Output from previous day:  Intake/Output Summary (Last 24 hours) at 08/16/13 1021 Last data filed at 08/16/13 1000  Gross per 24 hour  Intake    480 ml  Output   2900 ml  Net  -2420 ml    Physical Exam: Affect appropriate Chronically ill white male  HEENT: normal Neck supple with no adenopathy JVP normal no bruits no thyromegaly Lungs Poor air movement no active wheezing decreased BS right base  Heart:  S1/S2 no murmur, no rub, gallop or click PMI normal Abdomen: benighn, BS positve, no tenderness, no AAA no bruit.  No HSM or HJR Distal pulses intact with no bruits No edema Neuro non-focal Skin warm and dry No muscular weakness   Lab Results:  Imaging: Dg Chest 2 View  08/16/2013   CLINICAL DATA:  Follow-up pleural effusion.  Weakness.  Edema.  EXAM: CHEST  2 VIEW  COMPARISON:  08/15/2013  FINDINGS: Right subclavian dual lead pacemaker is unchanged. Enlargement of the cardiac silhouette is unchanged. Partially loculated right pleural effusion does not appear significantly changed. There may be trace left pleural fluid. Pulmonary vascular congestion has decreased. Coarse interstitial opacities remain bilaterally. Hazy airspace opacities in the right lower lung have improved. Spiculated left upper lobe nodule is unchanged. No acute osseous abnormality is seen.  IMPRESSION: 1. Mildly improved vascular congestion and right lower lobe airspace opacity, suggestive of improved  edema. 2. Unchanged, loculated right pleural effusion. Possible trace residual left pleural effusion.   Electronically Signed   By: Logan Bores   On: 08/16/2013 07:51   Dg Chest 2 View  08/15/2013   CLINICAL DATA:  Pneumonia versus CHF.  EXAM: CHEST  2 VIEW  COMPARISON:  CT, 08/13/2013 and chest radiograph, 08/12/2013.  FINDINGS: Small bilateral pleural effusions, loculated on the right, are stable. There is interstitial thickening and fell areas of coarse reticular opacity in the lungs. Hazy airspace type opacity this also noted in the lower lungs. Stable focal opacity in the left upper lobe reflects the spiculated nodule noted on the current CT.  Cardiac silhouette is enlarged. Mediastinum is normal in contour. There is enlargement of both pulmonary arteries.  Right anterior chest wall sequential pacemaker is stable in well positioned.  IMPRESSION: 1. No change from the previous chest radiograph. 2. Interstitial thickening may be chronic. A component of interstitial edema is possible. No convincing pneumonia. 3. Left upper lobe mass as detailed on the previous CT. 4. Small pleural effusions, loculated on the right.   Electronically Signed   By: Lajean Manes M.D.   On: 08/15/2013 09:45    Cardiac Studies:  ECG:  SR RBBB biV paicing    Telemetry:  SR biV pacing with RBBB pattern  Echo:  EF 40%  Bioprosthetic MV  No current facility-administered medications on file  prior to encounter.   Current Outpatient Prescriptions on File Prior to Encounter  Medication Sig Dispense Refill  . aspirin EC 81 MG tablet Take 1 tablet (81 mg total) by mouth daily.  90 tablet  3  . atorvastatin (LIPITOR) 20 MG tablet Take 1 tablet (20 mg total) by mouth daily.  90 tablet  0  . carvedilol (COREG) 3.125 MG tablet Take 1 tablet (3.125 mg total) by mouth daily.  90 tablet  3  . Fluticasone-Salmeterol (ADVAIR) 500-50 MCG/DOSE AEPB Inhale 1 puff into the lungs every 12 (twelve) hours.  60 each  10  . furosemide (LASIX)  40 MG tablet Take 1 tablet (40 mg total) by mouth daily.  100 tablet  3  . nitroGLYCERIN (NITROSTAT) 0.4 MG SL tablet Place 1 tablet (0.4 mg total) under the tongue every 5 (five) minutes as needed. For chest pain  25 tablet  1  . nizatidine (AXID) 150 MG capsule Take 1 capsule (150 mg total) by mouth 2 (two) times daily.  200 capsule  3  . ramipril (ALTACE) 2.5 MG capsule Take 1 capsule (2.5 mg total) by mouth daily.  100 capsule  3       Assessment/Plan:  Pulm:  Reviewed CT  He has a lot of chronic COPD scarring and airspace disease  Effusons loculated and would not be able to be drained.  I discussed possible diagnosis of cancer with him  Still requiring high oxygen.  BNP elevated but really not that much in 1700 range. Appreciate Pulmonary note Agree with stopping antibiotics but not about vasodilators.  Will arrange right heart cath today to see what PA and PCWP are with current Rx som we can optimize and hopefully d/c over weekend   [name] 08/16/2013, 10:21 AM

## 2013-08-16 NOTE — Progress Notes (Signed)
Pt coughing requesting medication, Dr Wendee Beavers made aware and orders recieved, given medication for cough as ordered. Will monitor . Shaaron Golliday, Bettina Gavia RN

## 2013-08-16 NOTE — Progress Notes (Signed)
[facility] PROGRESS NOTE  [name] AYT:[id number] DOB: [birth year] DOA: 08/12/2013 PCP: Joycelyn Man, MD  Assessment/Plan:  COPD/pulmonary nodule/Pulmonary hypertension/ CAP - Pulmonology consult: BNP 08/16/13-1738, escalated diuresis with good response, f/u CXR with improved vascular congestion/edema. Awaiting decision regarding inpatient PFTs - re-image pulmonary nodule as recommended by Pulmonologist: Repeat q3 months X 12 months, then q 6 months X 12 months. These can be limited, non-contrasted studies to minimize radiatio exposure. If at any time, the nodule increases in size, biopsy can be considered (a transthoracic approach would likely be feasible given its location - requiring supplemental oxygen - [place] while in house    CAD (coronary artery disease) - stable continue statin    Atrial fibrillation - Patient has had AV node ablation, He is biventricular paced.  - He cannot be anticoagulated because of his history of a cerebral bleed    Cardiomyopathy - Cardiology on board. We will f/u with their recommendations. -  Ejection fraction < 50% - Right heart cath on 08/16/13: significant pulmonary hypertension PCWP not that high given EF per cardiology -  ICD (implantable cardioverter-defibrillator), biventricular, in situ   Code Status: full Family Communication: Discussed plan of care with patient and wife. Disposition Plan: Pending pulmonology and cardiology recommendations.   Consultants:  Cardiology  Pulmonology  Procedures:  Right heart cath on 08/15/13  Antibiotics:  On levaquin- d/c 2/5  HPI/Subjective: No new complaints. Reports persistent dry cough. Patient states that he is breathing a little better.   Objective: Filed Vitals:   08/16/13 1400  BP: 104/61  Pulse:   Temp:   Resp:     Intake/Output Summary (Last 24 hours) at 08/16/13 1552 Last data filed at 08/16/13 1000  Gross per 24 hour  Intake    240 ml  Output   2900 ml  Net   -2660 ml   Filed Weights   08/14/13 0330 08/15/13 0609 08/16/13 0624  Weight: 96.3 kg (212 lb 4.9 oz) 96.1 kg (211 lb 13.8 oz) 94.575 kg (208 lb 8 oz)    Exam:   General:  In no acute distress, alert and awake  Cardiovascular: NSR, biventricular pacing, no MGR, + dry cough  Respiratory: No wheezes, breathing comfortably with nasal cannula in place breath sounds clear bilaterally, speaking in full sentences  Abdomen: Soft, nondistended, nontender  Musculoskeletal: No cyanosis or clubbing.  Data Reviewed: Basic Metabolic Panel:  Recent Labs Lab 08/12/13 1810 08/13/13 0715 08/16/13 1024  NA 138 139 138  K 4.0 4.1 4.3  CL 96 98 94*  CO2 25 28 33*  GLUCOSE 164* 175* 287*  BUN 25* 20 17  CREATININE 1.08 0.92 1.01  CALCIUM 8.5 8.3* 9.2   Liver Function Tests:  Recent Labs Lab 08/12/13 1810 08/13/13 0715  AST 15 14  ALT 14 15  ALKPHOS 61 59  BILITOT 2.9* 3.0*  PROT 6.3 6.2  ALBUMIN 3.4* 3.3*   No results found for this basename: LIPASE, AMYLASE,  in the last 168 hours No results found for this basename: AMMONIA,  in the last 168 hours CBC:  Recent Labs Lab 08/12/13 1810  WBC 8.1  HGB 16.4  HCT 48.7  MCV 92.2  PLT 197   Cardiac Enzymes:  Recent Labs Lab 08/12/13 1810 08/12/13 2302 08/13/13 0715  TROPONINI <0.30 <0.30 <0.30   BNP (last 3 results)  Recent Labs  08/15/13 1205 08/15/13 1405  PROBNP 1743.0* 1738.0*   CBG:  Recent Labs Lab 08/12/13 1741 08/13/13 1151  GLUCAP 166*  208*    Recent Results (from the past 240 hour(s))  CULTURE, BLOOD (ROUTINE X 2)     Status: None   Collection Time    08/12/13  5:52 PM      Result Value Range Status   Specimen Description BLOOD UPPER RIGHT ARM   Final   Special Requests BOTTLES DRAWN AEROBIC AND ANAEROBIC 10CC   Final   Culture  Setup Time     Final   Value: 08/13/2013 01:12     Performed at [facility]   Culture     Final   Value:        BLOOD CULTURE RECEIVED NO GROWTH TO DATE  CULTURE WILL BE HELD FOR 5 DAYS BEFORE ISSUING A FINAL NEGATIVE REPORT     Performed at [facility]   Report Status PENDING   Incomplete  CULTURE, BLOOD (ROUTINE X 2)     Status: None   Collection Time    08/12/13  6:10 PM      Result Value Range Status   Specimen Description BLOOD LEFT UPPER ARM   Final   Special Requests BOTTLES DRAWN AEROBIC AND ANAEROBIC 10CC   Final   Culture  Setup Time     Final   Value: 08/13/2013 01:12     Performed at [facility]   Culture     Final   Value:        BLOOD CULTURE RECEIVED NO GROWTH TO DATE CULTURE WILL BE HELD FOR 5 DAYS BEFORE ISSUING A FINAL NEGATIVE REPORT     Performed at [facility]   Report Status PENDING   Incomplete  MRSA PCR SCREENING     Status: None   Collection Time    08/12/13  8:45 PM      Result Value Range Status   MRSA by PCR NEGATIVE  NEGATIVE Final   Comment:            The GeneXpert MRSA Assay (FDA     approved for NASAL specimens     only), is one component of a     comprehensive MRSA colonization     surveillance program. It is not     intended to diagnose MRSA     infection nor to guide or     monitor treatment for     MRSA infections.     Studies: Dg Chest 2 View  08/16/2013   CLINICAL DATA:  Follow-up pleural effusion.  Weakness.  Edema.  EXAM: CHEST  2 VIEW  COMPARISON:  08/15/2013  FINDINGS: Right subclavian dual lead pacemaker is unchanged. Enlargement of the cardiac silhouette is unchanged. Partially loculated right pleural effusion does not appear significantly changed. There may be trace left pleural fluid. Pulmonary vascular congestion has decreased. Coarse interstitial opacities remain bilaterally. Hazy airspace opacities in the right lower lung have improved. Spiculated left upper lobe nodule is unchanged. No acute osseous abnormality is seen.  IMPRESSION: 1. Mildly improved vascular congestion and right lower lobe airspace opacity, suggestive of improved edema. 2. Unchanged,  loculated right pleural effusion. Possible trace residual left pleural effusion.   Electronically Signed   By: Logan Bores   On: 08/16/2013 07:51   Dg Chest 2 View  08/15/2013   CLINICAL DATA:  Pneumonia versus CHF.  EXAM: CHEST  2 VIEW  COMPARISON:  CT, 08/13/2013 and chest radiograph, 08/12/2013.  FINDINGS: Small bilateral pleural effusions, loculated on the right, are stable. There is interstitial thickening and fell areas  of coarse reticular opacity in the lungs. Hazy airspace type opacity this also noted in the lower lungs. Stable focal opacity in the left upper lobe reflects the spiculated nodule noted on the current CT.  Cardiac silhouette is enlarged. Mediastinum is normal in contour. There is enlargement of both pulmonary arteries.  Right anterior chest wall sequential pacemaker is stable in well positioned.  IMPRESSION: 1. No change from the previous chest radiograph. 2. Interstitial thickening may be chronic. A component of interstitial edema is possible. No convincing pneumonia. 3. Left upper lobe mass as detailed on the previous CT. 4. Small pleural effusions, loculated on the right.   Electronically Signed   By: Lajean Manes M.D.   On: 08/15/2013 09:45    Scheduled Meds: . atorvastatin  20 mg Oral Daily  . budesonide  0.25 mg Nebulization Q6H  . furosemide  40 mg Oral Daily  . ipratropium-albuterol  3 mL Nebulization BID  . isosorbide dinitrate  10 mg Oral TID  . sodium chloride  3 mL Intravenous Q12H  . sodium chloride  3 mL Intravenous Q12H   Continuous Infusions: . sodium chloride      Active Problems:   Time spent: > 35 minutes    Larwance Sachs  [facility] Pager [phone] If 7PM-7AM, please contact night-coverage at [email or web address], password [facility] 08/16/2013, 3:52 PM  LOS: 4 days

## 2013-08-16 NOTE — CV Procedure (Signed)
Right Heart Catheterization:  Hypoxia, CHF and pulmonary Hypertension  Mean RA 11 mmHg RV 54/5 mmHg  PA:  56/29 mean 39 mmHg PCWP:  Mean 25 mmHg  CO:  3.39   Arterial Sat 90% PA Sat 51%  Impression:  Significant pulmonary hypertension  PCWP not that high given EF  Certainly his resting hypoxia is dysproportionate to CHF And implies it is from his advanced underlying lung disease.  Would not want his PCWP much lower as his CO is already borderline.   Add nitrates and continue current diuretic dose.  Tolerated procedure well 2mg  Versed   [name]

## 2013-08-17 LAB — BASIC METABOLIC PANEL
BUN: 20 mg/dL (ref 6–23)
CO2: 31 mEq/L (ref 19–32)
CREATININE: 1.04 mg/dL (ref 0.50–1.35)
Calcium: 8.8 mg/dL (ref 8.4–10.5)
Chloride: 96 mEq/L (ref 96–112)
GFR, EST AFRICAN AMERICAN: 84 mL/min — AB (ref >90)
GFR, EST NON AFRICAN AMERICAN: 73 mL/min — AB (ref >90)
GLUCOSE: 180 mg/dL — AB (ref 70–99)
Potassium: 4.4 mEq/L (ref 3.7–5.3)
Sodium: 140 mEq/L (ref 137–147)

## 2013-08-17 LAB — CBC
HEMATOCRIT: 47.1 % (ref 39.0–52.0)
HEMOGLOBIN: 15.4 g/dL (ref 13.0–17.0)
MCH: 30.7 pg (ref 26.0–34.0)
MCHC: 32.7 g/dL (ref 30.0–36.0)
MCV: 94 fL (ref 78.0–100.0)
Platelets: 180 10*3/uL (ref 150–400)
RBC: 5.01 MIL/uL (ref 4.22–5.81)
RDW: 15.7 % — ABNORMAL HIGH (ref 11.5–15.5)
WBC: 8 10*3/uL (ref 4.0–10.5)

## 2013-08-17 LAB — GLUCOSE, CAPILLARY: Glucose-Capillary: 266 mg/dL — ABNORMAL HIGH (ref 70–99)

## 2013-08-17 MED ORDER — CARVEDILOL 3.125 MG PO TABS
3.1250 mg | ORAL_TABLET | Freq: Two times a day (BID) | ORAL | Status: DC
  Administered 2013-08-18: 3.125 mg via ORAL
  Filled 2013-08-17 (×4): qty 1

## 2013-08-17 MED ORDER — RAMIPRIL 2.5 MG PO CAPS
2.5000 mg | ORAL_CAPSULE | Freq: Every day | ORAL | Status: DC
  Administered 2013-08-18: 2.5 mg via ORAL
  Filled 2013-08-17 (×2): qty 1

## 2013-08-17 NOTE — Progress Notes (Signed)
Patient c/o dizziness while sitting on side of bed. BP 99/55, O2 sat 93%. Assisted back to bed but patient continued to c/o dizziness after 5 min in bed. BP 112/59. HR 60's. No neuro deficits noted; +grips noted. Wife at bedside. Wife stated that he was told he was pre-diabetic 1 week ago. She stated is glucose was high and he was given diet advice but has not followed up d/t hospitalization. Dr. Wendee Beavers notifed; order given for one time CBG.Cindee Salt

## 2013-08-17 NOTE — Progress Notes (Signed)
Patient ID: [name], male   DOB: 06-Apr-1946, 68 y.o.   MRN: [id number]    Subjective:  Breathing better Sore throat    Objective:  Filed Vitals:   08/16/13 1945 08/17/13 0456 08/17/13 0724 08/17/13 0952  BP: 113/69 100/62    Pulse: 73 79    Temp: 97.9 F (36.6 C) 98.7 F (37.1 C)    TempSrc: Oral Oral    Resp: 18 18    Height:      Weight:   209 lb 6.4 oz (94.983 kg)   SpO2: 96% 95%  98%    Intake/Output from previous day:  Intake/Output Summary (Last 24 hours) at 08/17/13 1116 Last data filed at 08/17/13 1035  Gross per 24 hour  Intake    243 ml  Output    400 ml  Net   -157 ml    Physical Exam: Affect appropriate Chronically ill white male  HEENT: normal Neck supple with no adenopathy JVP normal no bruits no thyromegaly Lungs Poor air movement no active wheezing decreased BS right base  Heart:  S1/S2 no murmur, no rub, gallop or click PMI normal Abdomen: benighn, BS positve, no tenderness, no AAA no bruit.  No HSM or HJR Distal pulses intact with no bruits No edema Neuro non-focal Skin warm and dry No muscular weakness   Lab Results:  Imaging: Dg Chest 2 View  08/16/2013   CLINICAL DATA:  Follow-up pleural effusion.  Weakness.  Edema.  EXAM: CHEST  2 VIEW  COMPARISON:  08/15/2013  FINDINGS: Right subclavian dual lead pacemaker is unchanged. Enlargement of the cardiac silhouette is unchanged. Partially loculated right pleural effusion does not appear significantly changed. There may be trace left pleural fluid. Pulmonary vascular congestion has decreased. Coarse interstitial opacities remain bilaterally. Hazy airspace opacities in the right lower lung have improved. Spiculated left upper lobe nodule is unchanged. No acute osseous abnormality is seen.  IMPRESSION: 1. Mildly improved vascular congestion and right lower lobe airspace opacity, suggestive of improved edema. 2. Unchanged, loculated right pleural effusion. Possible trace residual left pleural  effusion.   Electronically Signed   By: Logan Bores   On: 08/16/2013 07:51    Cardiac Studies:  ECG:  SR RBBB biV paicing    Telemetry:  SR biV pacing with RBBB pattern  Echo:  EF 40%  Bioprosthetic MV  No current facility-administered medications on file prior to encounter.   Current Outpatient Prescriptions on File Prior to Encounter  Medication Sig Dispense Refill  . aspirin EC 81 MG tablet Take 1 tablet (81 mg total) by mouth daily.  90 tablet  3  . atorvastatin (LIPITOR) 20 MG tablet Take 1 tablet (20 mg total) by mouth daily.  90 tablet  0  . carvedilol (COREG) 3.125 MG tablet Take 1 tablet (3.125 mg total) by mouth daily.  90 tablet  3  . Fluticasone-Salmeterol (ADVAIR) 500-50 MCG/DOSE AEPB Inhale 1 puff into the lungs every 12 (twelve) hours.  60 each  10  . furosemide (LASIX) 40 MG tablet Take 1 tablet (40 mg total) by mouth daily.  100 tablet  3  . nitroGLYCERIN (NITROSTAT) 0.4 MG SL tablet Place 1 tablet (0.4 mg total) under the tongue every 5 (five) minutes as needed. For chest pain  25 tablet  1  . nizatidine (AXID) 150 MG capsule Take 1 capsule (150 mg total) by mouth 2 (two) times daily.  200 capsule  3  . ramipril (ALTACE) 2.5 MG capsule Take 1  capsule (2.5 mg total) by mouth daily.  100 capsule  3     . sodium chloride      Assessment/Plan:  CHF:  Compensated with PCWP 25 on right heart  Continue current dose of lasix, ACE and beta blocker  PA pressure elevated near 60 systolic Added isordil for vasodilatation  Biggest issue is Gold Class 4 COPD.  Will need continuous home oxygen and close pulmonary f/u for lung nodule Will sign off Ok to go home Charles weekend Will arrange f/u with Dr April Manson [facility] 08/17/2013, 11:16 AM

## 2013-08-17 NOTE — Progress Notes (Signed)
CBG 266. Dr. Wendee Beavers notified; no new orders. Will continue to monitor.[name]

## 2013-08-17 NOTE — Progress Notes (Signed)
BP 110/63 HR 76. Coreg and Isordil held at this time due to previous dizziness. Dr. Wendee Beavers aware of situation.

## 2013-08-17 NOTE — Progress Notes (Signed)
[facility] PROGRESS NOTE  [name] GYI:[id number] DOB: [birth year] DOA: 08/12/2013 PCP: Joycelyn Man, MD  Assessment/Plan:  COPD/pulmonary nodule/Pulmonary hypertension/ CAP - Pulmonology consulted, plan will be for discharge from hospital with pulmonologist follow up for further evaluation (pulmonary function testing) and recommendations. - re-image pulmonary nodule as recommended by Pulmonologist: Repeat q3 months X 12 months, then q 6 months X 12 months. These can be limited, non-contrasted studies to minimize radiatio exposure. If at any time, the nodule increases in size, biopsy can be considered (a transthoracic approach would likely be feasible given its location - requiring supplemental oxygen    CAD (coronary artery disease) - stable continue statin    Atrial fibrillation - Patient has had AV node ablation, He is biventricular paced.  - He cannot be anticoagulated because of his history of a cerebral bleed    Cardiomyopathy -  Ejection fraction < 50% - Right heart cath on 08/16/13: significant pulmonary hypertension. Per last cardiology notes patient PA pressure elevated near 60 systolic. Biggest issue Gold Class 4 COPD -  ICD (implantable cardioverter-defibrillator), biventricular, in situ - Plan is to continue current dose of lasix, ACE and B Blocker   Code Status: full Family Communication: Discussed plan of care with patient and wife. Disposition Plan: Discharge most likely next am 08/18/13   Consultants:  Cardiology  Pulmonology  Procedures:  Right heart cath on 08/15/13  Antibiotics:  On levaquin- d/c 2/5  HPI/Subjective: No new complaints. Reports persistent dry cough. Patient states that he is breathing a little better.   Objective: Filed Vitals:   08/17/13 1231  BP: 112/59  Pulse: 73  Temp:   Resp:     Intake/Output Summary (Last 24 hours) at 08/17/13 1444 Last data filed at 08/17/13 1035  Gross per 24 hour  Intake    243 ml   Output    400 ml  Net   -157 ml   Filed Weights   08/15/13 0609 08/16/13 0624 08/17/13 0724  Weight: 96.1 kg (211 lb 13.8 oz) 94.575 kg (208 lb 8 oz) 94.983 kg (209 lb 6.4 oz)    Exam:   General:  In no acute distress, alert and awake  Cardiovascular: NSR, biventricular pacing, no MGR, + dry cough  Respiratory: No wheezes, breathing comfortably with nasal cannula in place breath sounds clear bilaterally, speaking in full sentences  Abdomen: Soft, nondistended, nontender  Musculoskeletal: No cyanosis or clubbing.  Data Reviewed: Basic Metabolic Panel:  Recent Labs Lab 08/12/13 1810 08/13/13 0715 08/16/13 1024 08/17/13 0443  NA 138 139 138 140  K 4.0 4.1 4.3 4.4  CL 96 98 94* 96  CO2 25 28 33* 31  GLUCOSE 164* 175* 287* 180*  BUN 25* 20 17 20   CREATININE 1.08 0.92 1.01 1.04  CALCIUM 8.5 8.3* 9.2 8.8   Liver Function Tests:  Recent Labs Lab 08/12/13 1810 08/13/13 0715  AST 15 14  ALT 14 15  ALKPHOS 61 59  BILITOT 2.9* 3.0*  PROT 6.3 6.2  ALBUMIN 3.4* 3.3*   No results found for this basename: LIPASE, AMYLASE,  in the last 168 hours No results found for this basename: AMMONIA,  in the last 168 hours CBC:  Recent Labs Lab 08/12/13 1810 08/17/13 0443  WBC 8.1 8.0  HGB 16.4 15.4  HCT 48.7 47.1  MCV 92.2 94.0  PLT 197 180   Cardiac Enzymes:  Recent Labs Lab 08/12/13 1810 08/12/13 2302 08/13/13 0715  TROPONINI <0.30 <0.30 <0.30   BNP (last  3 results)  Recent Labs  08/15/13 1205 08/15/13 1405  PROBNP 1743.0* 1738.0*   CBG:  Recent Labs Lab 08/12/13 1741 08/13/13 1151 08/17/13 1319  GLUCAP 166* 208* 266*    Recent Results (from the past 240 hour(s))  CULTURE, BLOOD (ROUTINE X 2)     Status: None   Collection Time    08/12/13  5:52 PM      Result Value Range Status   Specimen Description BLOOD UPPER RIGHT ARM   Final   Special Requests BOTTLES DRAWN AEROBIC AND ANAEROBIC 10CC   Final   Culture  Setup Time     Final   Value:  08/13/2013 01:12     Performed at [facility]   Culture     Final   Value:        BLOOD CULTURE RECEIVED NO GROWTH TO DATE CULTURE WILL BE HELD FOR 5 DAYS BEFORE ISSUING A FINAL NEGATIVE REPORT     Performed at [facility]   Report Status PENDING   Incomplete  CULTURE, BLOOD (ROUTINE X 2)     Status: None   Collection Time    08/12/13  6:10 PM      Result Value Range Status   Specimen Description BLOOD LEFT UPPER ARM   Final   Special Requests BOTTLES DRAWN AEROBIC AND ANAEROBIC 10CC   Final   Culture  Setup Time     Final   Value: 08/13/2013 01:12     Performed at [facility]   Culture     Final   Value:        BLOOD CULTURE RECEIVED NO GROWTH TO DATE CULTURE WILL BE HELD FOR 5 DAYS BEFORE ISSUING A FINAL NEGATIVE REPORT     Performed at [facility]   Report Status PENDING   Incomplete  MRSA PCR SCREENING     Status: None   Collection Time    08/12/13  8:45 PM      Result Value Range Status   MRSA by PCR NEGATIVE  NEGATIVE Final   Comment:            The GeneXpert MRSA Assay (FDA     approved for NASAL specimens     only), is one component of a     comprehensive MRSA colonization     surveillance program. It is not     intended to diagnose MRSA     infection nor to guide or     monitor treatment for     MRSA infections.     Studies: Dg Chest 2 View  08/16/2013   CLINICAL DATA:  Follow-up pleural effusion.  Weakness.  Edema.  EXAM: CHEST  2 VIEW  COMPARISON:  08/15/2013  FINDINGS: Right subclavian dual lead pacemaker is unchanged. Enlargement of the cardiac silhouette is unchanged. Partially loculated right pleural effusion does not appear significantly changed. There may be trace left pleural fluid. Pulmonary vascular congestion has decreased. Coarse interstitial opacities remain bilaterally. Hazy airspace opacities in the right lower lung have improved. Spiculated left upper lobe nodule is unchanged. No acute osseous abnormality is seen.   IMPRESSION: 1. Mildly improved vascular congestion and right lower lobe airspace opacity, suggestive of improved edema. 2. Unchanged, loculated right pleural effusion. Possible trace residual left pleural effusion.   Electronically Signed   By: Logan Bores   On: 08/16/2013 07:51    Scheduled Meds: . atorvastatin  20 mg Oral Daily  . budesonide  0.25 mg Nebulization  Q6H  . carvedilol  3.125 mg Oral BID WC  . furosemide  40 mg Oral Daily  . ipratropium-albuterol  3 mL Nebulization BID  . isosorbide dinitrate  10 mg Oral TID  . ramipril  2.5 mg Oral Daily  . sodium chloride  3 mL Intravenous Q12H  . sodium chloride  3 mL Intravenous Q12H   Continuous Infusions: . sodium chloride      Active Problems:   Time spent: > 35 minutes    [name]  [facility] Pager [phone] If 7PM-7AM, please contact night-coverage at [email or web address], password [facility] 08/17/2013, 2:44 PM  LOS: 5 days

## 2013-08-18 DIAGNOSIS — E785 Hyperlipidemia, unspecified: Secondary | ICD-10-CM

## 2013-08-18 MED ORDER — CARVEDILOL 3.125 MG PO TABS: 3.1250 mg | ORAL_TABLET | Freq: Two times a day (BID) | ORAL | Status: DC

## 2013-08-18 MED ORDER — ISOSORBIDE DINITRATE 10 MG PO TABS: 10.0000 mg | ORAL_TABLET | Freq: Three times a day (TID) | ORAL | Status: DC

## 2013-08-18 MED ORDER — BUDESONIDE-FORMOTEROL FUMARATE 160-4.5 MCG/ACT IN AERO: 2.0000 | INHALATION_SPRAY | Freq: Two times a day (BID) | RESPIRATORY_TRACT | Status: DC

## 2013-08-18 MED ORDER — BENZONATATE 100 MG PO CAPS: 100.0000 mg | ORAL_CAPSULE | Freq: Three times a day (TID) | ORAL | Status: DC | PRN

## 2013-08-18 NOTE — Progress Notes (Signed)
Small amount of bleeding noted to right groin; appears superficial. Manual pressure applied x 10 min. No further bleeding or hematoma palpable. Dr. Wendee Beavers notified; okay to continue with discharge. 2x2 pressure dressing applied. Activity and precautions explained. IVs d/c'd with catheters intact. Patient transported via wheelchair to lobby for d/c home. Belongings and home O2 (x2) with patient and family.[name]

## 2013-08-18 NOTE — Progress Notes (Signed)
Oxygen delivered to patient's room. Pulmonology notified for follow up visit. Instructed that pulmonary office (Dr. Alva Garnet) will notify patient in am for date/time of appointment.Cindee Salt

## 2013-08-18 NOTE — Progress Notes (Signed)
SATURATION QUALIFICATIONS: (This note is used to comply with regulatory documentation for home oxygen)  Patient Saturations on Room Air at Rest = 90%  Patient Saturations on Room Air while Ambulating = 83%  Patient Saturations on 4Liters of oxygen while Ambulating = 93%  Please briefly explain why patient needs home oxygen: At rest patient is 90% on room air. While ambulating on room air patient desats to 83% and when returned to bed desat to 80%. Patient becomes very short of breath while ambulating.

## 2013-08-18 NOTE — Discharge Summary (Addendum)
Physician Discharge Summary  [name] OIB:[id number] DOB: 28-Jan-1946 DOA: 08/12/2013  PCP: Joycelyn Man, MD  Admit date: 08/12/2013 Discharge date: 08/18/2013  Time spent: > 35 minutes  Recommendations for Outpatient Follow-up:  1. Patient will need continued follow up with pulmonologist and cardiology 2. Discharged on supplemental oxygen 3. Will need pulmonary nodule monitoring as indicated below.  Discharge Diagnoses:  Active Problems:   COPD   CAD (coronary artery disease)   Atrial fibrillation   Cardiomyopathy   Ejection fraction < 50%   ICD (implantable cardioverter-defibrillator), biventricular, in situ   Pulmonary hypertension   Chronic systolic CHF (congestive heart failure)   Status post mitral valve replacement with bioprosthetic valve   Hypoxia   H/O atrioventricular nodal ablation   H/O intracranial hemorrhage   H/O endocarditis   H/O: CVA (cerebrovascular accident)   Solitary pulmonary nodule   Acute on chronic respiratory failure   Lung nodule   Emphysema/COPD   Discharge Condition: stable  Diet recommendation: heart healthy  Filed Weights   08/16/13 0624 08/17/13 0724 08/18/13 0551  Weight: 94.575 kg (208 lb 8 oz) 94.983 kg (209 lb 6.4 oz) 95.709 kg (211 lb)    History of present illness:  The patient is a 68 year old Caucasian male with complicated past medical history significant for mitral valve repair, atrial fibrillation not anticoagulated secondary to history of intracranial hemorrhage, history of AV node ablation paced with biventricular pacer. Who presented to the ED complaining of worsening shortness of breath.  Hospital Course:  COPD/pulmonary nodule/Pulmonary hypertension - Pulmonology consulted while patient in house. Plans will be for patient to keep continue to follow up with pulmonology as outpatient. - re-image pulmonary nodule as recommended by Pulmonologist:  Repeat q3 months X 12 months, then q 6 months X 12 months. These can  be limited, non-contrasted studies to minimize radiatio exposure. If at any time, the nodule increases in size, biopsy can be considered (a transthoracic approach would likely be feasible given its location  - Next CT should be repeated around mid-May - requiring supplemental oxygen as such will discharge on supplemental oxygen - Discharge on Symbicort.  CAD (coronary artery disease)  - stable continue statin and asa  Atrial fibrillation  - Patient has had AV node ablation, He is biventricular paced.  - He cannot be anticoagulated because of his history of a cerebral bleed   Cardiomyopathy  - Ejection fraction < 50%  - Right heart cath on 08/16/13: significant pulmonary hypertension.  Per last cardiology notes patient PA pressure elevated near 60 systolic. Biggest issue Gold Class 4 COPD  - ICD (implantable cardioverter-defibrillator), biventricular, in situ  - Plan is to continue current dose of lasix, ACE and B Blocker    Procedures:  Right heart catheterization on 08/16/2013  Consultations:  Cardiology: Dr. Johnsie Cancel  Pulmonology: Dr. Alva Garnet  Discharge Exam: Filed Vitals:   08/18/13 1015  BP: 106/58  Pulse: 75  Temp:   Resp:     General: Pt in NAD, Alert and awake Cardiovascular: S1 and S2 WNL, no rubs Respiratory: CTA BL, no wheezes, speaking in full sentences on supplemental oxygen via nasal cannula  Discharge Instructions  Discharge Orders   Future Appointments Provider Department Dept Phone   10/28/2013 9:15 AM [facility] [place] [place]   11/04/2013 11:45 AM Dorena Cookey, MD [facility] [place]   Future Orders Complete By Expires   Call MD for:  difficulty breathing, headache or visual disturbances  As directed  Call MD for:  temperature >100.4  As directed    Diet - low sodium heart healthy  As directed    Increase activity slowly  As directed        Medication List    STOP taking these  medications       nizatidine 150 MG capsule  Commonly known as:  AXID      TAKE these medications       aspirin EC 81 MG tablet  Take 1 tablet (81 mg total) by mouth daily.     atorvastatin 20 MG tablet  Commonly known as:  LIPITOR  Take 1 tablet (20 mg total) by mouth daily.     benzonatate 100 MG capsule  Commonly known as:  TESSALON  Take 1 capsule (100 mg total) by mouth 3 (three) times daily as needed for cough.     budesonide-formoterol 160-4.5 MCG/ACT inhaler  Commonly known as:  SYMBICORT  Inhale 2 puffs into the lungs 2 (two) times daily.     carvedilol 3.125 MG tablet  Commonly known as:  COREG  Take 1 tablet (3.125 mg total) by mouth 2 (two) times daily with a meal.     Fluticasone-Salmeterol 500-50 MCG/DOSE Aepb  Commonly known as:  ADVAIR  Inhale 1 puff into the lungs every 12 (twelve) hours.     furosemide 40 MG tablet  Commonly known as:  LASIX  Take 1 tablet (40 mg total) by mouth daily.     isosorbide dinitrate 10 MG tablet  Commonly known as:  ISORDIL  Take 1 tablet (10 mg total) by mouth 3 (three) times daily.     nitroGLYCERIN 0.4 MG SL tablet  Commonly known as:  NITROSTAT  Place 1 tablet (0.4 mg total) under the tongue every 5 (five) minutes as needed. For chest pain     ramipril 2.5 MG capsule  Commonly known as:  ALTACE  Take 1 capsule (2.5 mg total) by mouth daily.       Allergies  Allergen Reactions  . Anticoagulant Compound     Pt had intracranial bleed, therefore all anticoagulation is contra-indicated per Dr. Ron Parker.  . Warfarin Sodium       The results of significant diagnostics from this hospitalization (including imaging, microbiology, ancillary and laboratory) are listed below for reference.    Significant Diagnostic Studies: Dg Chest 2 View  08/16/2013   CLINICAL DATA:  Follow-up pleural effusion.  Weakness.  Edema.  EXAM: CHEST  2 VIEW  COMPARISON:  08/15/2013  FINDINGS: Right subclavian dual lead pacemaker is unchanged.  Enlargement of the cardiac silhouette is unchanged. Partially loculated right pleural effusion does not appear significantly changed. There may be trace left pleural fluid. Pulmonary vascular congestion has decreased. Coarse interstitial opacities remain bilaterally. Hazy airspace opacities in the right lower lung have improved. Spiculated left upper lobe nodule is unchanged. No acute osseous abnormality is seen.  IMPRESSION: 1. Mildly improved vascular congestion and right lower lobe airspace opacity, suggestive of improved edema. 2. Unchanged, loculated right pleural effusion. Possible trace residual left pleural effusion.   Electronically Signed   By: Logan Bores   On: 08/16/2013 07:51   Dg Chest 2 View  08/15/2013   CLINICAL DATA:  Pneumonia versus CHF.  EXAM: CHEST  2 VIEW  COMPARISON:  CT, 08/13/2013 and chest radiograph, 08/12/2013.  FINDINGS: Small bilateral pleural effusions, loculated on the right, are stable. There is interstitial thickening and fell areas of coarse reticular opacity in the lungs. Hazy airspace  type opacity this also noted in the lower lungs. Stable focal opacity in the left upper lobe reflects the spiculated nodule noted on the current CT.  Cardiac silhouette is enlarged. Mediastinum is normal in contour. There is enlargement of both pulmonary arteries.  Right anterior chest wall sequential pacemaker is stable in well positioned.  IMPRESSION: 1. No change from the previous chest radiograph. 2. Interstitial thickening may be chronic. A component of interstitial edema is possible. No convincing pneumonia. 3. Left upper lobe mass as detailed on the previous CT. 4. Small pleural effusions, loculated on the right.   Electronically Signed   By: Lajean Manes M.D.   On: 08/15/2013 09:45   Dg Chest 2 View  08/12/2013   CLINICAL DATA:  Shortness of breath.  Hypoxia.  EXAM: CHEST  2 VIEW  COMPARISON:  PA and lateral chest 02/06/2012.  FINDINGS: There is cardiomegaly. There is extensive right  middle and lower lobe airspace disease. Milder degree of airspace opacity is seen in the lingula. No pneumothorax identified. The chest is hyperexpanded. Pacing device in place.  IMPRESSION: Right much worse than left airspace disease likely reflects pneumonia rather than asymmetric edema. Followup films to clearing recommended.  Cardiomegaly.  Pulmonary hyperexpansion compatible with emphysema.   Electronically Signed   By: Inge Rise M.D.   On: 08/12/2013 22:33   Ct Chest W Contrast  08/13/2013   CLINICAL DATA:  Progressive shortness of breath. History of coronary artery disease, hypertension, CVA, COPD, atrial fibrillation and pulmonary hypertension.  EXAM: CT CHEST WITH CONTRAST  TECHNIQUE: Multidetector CT imaging of the chest was performed during intravenous contrast administration.  CONTRAST:  36mL OMNIPAQUE IOHEXOL 300 MG/ML  SOLN  COMPARISON:  DG CHEST 2 VIEW dated 08/12/2013; DG CHEST 2 VIEW dated 02/06/2012; CT ABD W/CM dated 06/07/2008  No prior chest CTs available.  FINDINGS: Right subclavian pacemaker leads extend into the right ventricle and coronary sinus. There is moderate cardiomegaly with asymmetric enlargement of the right cardiac chambers and the left atrium. There is central enlargement of the pulmonary arteries. No acute pulmonary emboli are demonstrated. There is relatively mild atherosclerosis of the aorta, great vessels and coronary arteries.  There is a partially loculated pleural effusion on the right which extends into the fissures. There is a small dependent pleural effusion on the left. There are pleural calcifications bilaterally without demonstrated pleural-based mass. There is no significant pericardial effusion.  Numerous small mediastinal and hilar lymph nodes are not pathologically enlarged. There is no axillary adenopathy or chest wall abnormality. Asymmetric gynecomastia is noted on the left.  Advanced emphysematous changes are present in both lungs with biapical bulla.  There is atelectasis at both lung bases adjacent to the pleural effusions. In the left upper lobe, there is a somewhat spiculated nodule measuring up to 1.5 cm transverse on image 16. No other focal nodules are identified. There is no endobronchial lesion.  The visualized upper abdomen appears stable without suspicious findings. There are no worrisome osseous findings.  IMPRESSION: 1. Spiculated left upper lobe nodule is morphologically concerning for possible bronchogenic carcinoma. Given the patient's multiple other medical problems, consider chest CT follow-up in 4- 6 months to assess stability. PET-CT could be performed if clinically warranted. 2. Cardiomegaly with central enlargement of the pulmonary arteries consistent with pulmonary arterial hypertension. 3. Bilateral pleural effusions, partially loculated on the right. There is mild adjacent atelectasis, but no focal airspace disease. There underlying pleural calcifications bilaterally, but no evidence of pleural-based mass. 4. Emphysema  and atherosclerosis noted.   Electronically Signed   By: Camie Patience M.D.   On: 08/13/2013 14:53    Microbiology: Recent Results (from the past 240 hour(s))  CULTURE, BLOOD (ROUTINE X 2)     Status: None   Collection Time    08/12/13  5:52 PM      Result Value Range Status   Specimen Description BLOOD UPPER RIGHT ARM   Final   Special Requests BOTTLES DRAWN AEROBIC AND ANAEROBIC 10CC   Final   Culture  Setup Time     Final   Value: 08/13/2013 01:12     Performed at [facility]   Culture     Final   Value:        BLOOD CULTURE RECEIVED NO GROWTH TO DATE CULTURE WILL BE HELD FOR 5 DAYS BEFORE ISSUING A FINAL NEGATIVE REPORT     Performed at [facility]   Report Status PENDING   Incomplete  CULTURE, BLOOD (ROUTINE X 2)     Status: None   Collection Time    08/12/13  6:10 PM      Result Value Range Status   Specimen Description BLOOD LEFT UPPER ARM   Final   Special Requests BOTTLES  DRAWN AEROBIC AND ANAEROBIC 10CC   Final   Culture  Setup Time     Final   Value: 08/13/2013 01:12     Performed at [facility]   Culture     Final   Value:        BLOOD CULTURE RECEIVED NO GROWTH TO DATE CULTURE WILL BE HELD FOR 5 DAYS BEFORE ISSUING A FINAL NEGATIVE REPORT     Performed at [facility]   Report Status PENDING   Incomplete  MRSA PCR SCREENING     Status: None   Collection Time    08/12/13  8:45 PM      Result Value Range Status   MRSA by PCR NEGATIVE  NEGATIVE Final   Comment:            The GeneXpert MRSA Assay (FDA     approved for NASAL specimens     only), is one component of a     comprehensive MRSA colonization     surveillance program. It is not     intended to diagnose MRSA     infection nor to guide or     monitor treatment for     MRSA infections.     Labs: Basic Metabolic Panel:  Recent Labs Lab 08/12/13 1810 08/13/13 0715 08/16/13 1024 08/17/13 0443  NA 138 139 138 140  K 4.0 4.1 4.3 4.4  CL 96 98 94* 96  CO2 25 28 33* 31  GLUCOSE 164* 175* 287* 180*  BUN 25* 20 17 20   CREATININE 1.08 0.92 1.01 1.04  CALCIUM 8.5 8.3* 9.2 8.8   Liver Function Tests:  Recent Labs Lab 08/12/13 1810 08/13/13 0715  AST 15 14  ALT 14 15  ALKPHOS 61 59  BILITOT 2.9* 3.0*  PROT 6.3 6.2  ALBUMIN 3.4* 3.3*   No results found for this basename: LIPASE, AMYLASE,  in the last 168 hours No results found for this basename: AMMONIA,  in the last 168 hours CBC:  Recent Labs Lab 08/12/13 1810 08/17/13 0443  WBC 8.1 8.0  HGB 16.4 15.4  HCT 48.7 47.1  MCV 92.2 94.0  PLT 197 180   Cardiac Enzymes:  Recent Labs Lab 08/12/13 1810 08/12/13 2302  08/13/13 0715  TROPONINI <0.30 <0.30 <0.30   BNP: BNP (last 3 results)  Recent Labs  08/15/13 1205 08/15/13 1405  PROBNP 1743.0* 1738.0*   CBG:  Recent Labs Lab 08/12/13 1741 08/13/13 1151 08/17/13 1319  GLUCAP 166* 208* 266*       Signed:  Velvet Bathe  [facility] 08/18/2013, 11:20 AM

## 2013-08-19 ENCOUNTER — Ambulatory Visit (INDEPENDENT_AMBULATORY_CARE_PROVIDER_SITE_OTHER): Payer: Medicare Other | Admitting: *Deleted

## 2013-08-19 DIAGNOSIS — I4891 Unspecified atrial fibrillation: Secondary | ICD-10-CM

## 2013-08-19 DIAGNOSIS — I429 Cardiomyopathy, unspecified: Secondary | ICD-10-CM

## 2013-08-19 DIAGNOSIS — I428 Other cardiomyopathies: Secondary | ICD-10-CM

## 2013-08-19 DIAGNOSIS — I5022 Chronic systolic (congestive) heart failure: Secondary | ICD-10-CM

## 2013-08-19 DIAGNOSIS — I509 Heart failure, unspecified: Secondary | ICD-10-CM | POA: Diagnosis not present

## 2013-08-19 LAB — MDC_IDC_ENUM_SESS_TYPE_REMOTE
HIGH POWER IMPEDANCE MEASURED VALUE: 75 Ohm
Implantable Pulse Generator Model: 3257
Implantable Pulse Generator Serial Number: 7053988
Lead Channel Pacing Threshold Amplitude: 0.625 V
Lead Channel Pacing Threshold Amplitude: 0.875 V
Lead Channel Pacing Threshold Pulse Width: 0.5 ms
Lead Channel Setting Pacing Amplitude: 2 V
Lead Channel Setting Sensing Sensitivity: 0.5 mV
MDC IDC MSMT LEADCHNL LV IMPEDANCE VALUE: 900 Ohm
MDC IDC MSMT LEADCHNL RV IMPEDANCE VALUE: 410 Ohm
MDC IDC MSMT LEADCHNL RV PACING THRESHOLD PULSEWIDTH: 0.5 ms
MDC IDC MSMT LEADCHNL RV SENSING INTR AMPL: 11.9 mV
MDC IDC SET LEADCHNL LV PACING AMPLITUDE: 2 V
MDC IDC SET LEADCHNL LV PACING PULSEWIDTH: 0.5 ms
MDC IDC SET LEADCHNL RV PACING PULSEWIDTH: 0.5 ms
Zone Setting Detection Interval: 270 ms
Zone Setting Detection Interval: 300 ms
Zone Setting Detection Interval: 370 ms

## 2013-08-19 LAB — CULTURE, BLOOD (ROUTINE X 2)
CULTURE: NO GROWTH
Culture: NO GROWTH

## 2013-08-22 ENCOUNTER — Encounter: Payer: Self-pay | Admitting: Internal Medicine

## 2013-08-22 ENCOUNTER — Ambulatory Visit (INDEPENDENT_AMBULATORY_CARE_PROVIDER_SITE_OTHER): Payer: Medicare Other | Admitting: Internal Medicine

## 2013-08-22 VITALS — BP 94/62 | HR 75 | Temp 97.7°F | Ht 74.0 in | Wt 213.6 lb

## 2013-08-22 DIAGNOSIS — J9610 Chronic respiratory failure, unspecified whether with hypoxia or hypercapnia: Secondary | ICD-10-CM | POA: Diagnosis not present

## 2013-08-22 DIAGNOSIS — I2510 Atherosclerotic heart disease of native coronary artery without angina pectoris: Secondary | ICD-10-CM

## 2013-08-22 DIAGNOSIS — J449 Chronic obstructive pulmonary disease, unspecified: Secondary | ICD-10-CM

## 2013-08-22 DIAGNOSIS — R911 Solitary pulmonary nodule: Secondary | ICD-10-CM

## 2013-08-22 MED ORDER — IRBESARTAN 75 MG PO TABS: 75.0000 mg | ORAL_TABLET | Freq: Every day | ORAL | Status: DC

## 2013-08-22 NOTE — Patient Instructions (Addendum)
Stop advair and altace  Start Ibesartan 75 mg one daily   See Tammy NP w/in  1 weeks with all your medications, even over the counter meds, separated in two separate bags, the ones you take no matter what vs the ones you stop once you feel better and take only as needed when you feel you need them.   Tammy  will generate for you a new user friendly medication calendar that will put [place] all on the same page re: your medication use.     Without this process, it simply isn't possible to assure that we are providing  your outpatient care  with  the attention to detail we feel you deserve.   If we cannot assure that you're getting that kind of care,  then we cannot manage your problem effectively from this clinic.  Once you have seen Tammy and we are sure that we're all on the same page with your medication use she will arrange follow up with > Dr Elsworth Soho with pfts

## 2013-08-23 ENCOUNTER — Encounter: Payer: Self-pay | Admitting: Internal Medicine

## 2013-08-23 NOTE — Progress Notes (Signed)
Subjective:     Patient ID: [name], male   DOB: 1945/10/11 MRN: [id number]  HPI  68 yo male ? quit smoking on admit:  Admit date: 08/12/2013  Discharge date: 08/18/2013  Time spent: > 35 minutes  Recommendations for Outpatient Follow-up:  1. Patient will need continued follow up with pulmonologist and cardiology 2. Discharged on supplemental oxygen 3. Will need pulmonary nodule monitoring as indicated below. Discharge Diagnoses:  Active Problems:  COPD  CAD (coronary artery disease)  Atrial fibrillation  Cardiomyopathy  Ejection fraction < 50%  ICD (implantable cardioverter-defibrillator), biventricular, in situ  Pulmonary hypertension  Chronic systolic CHF (congestive heart failure)  Status post mitral valve replacement with bioprosthetic valve  Hypoxia  H/O atrioventricular nodal ablation  H/O intracranial hemorrhage  H/O endocarditis  H/O: CVA (cerebrovascular accident)  Solitary pulmonary nodule  Acute on chronic respiratory failure  Lung nodule  Emphysema/COPD   08/23/2013 1st [facility] office visit/ Yanette [place]/  Comprehensive transition of care  Chief Complaint  Patient presents with  . Follow-up    PHOS x 4days,no sob,denies wheezing,denies cp or tightness,cough-clear,   main complaint is cough day > night min productive on acei.   No obvious day to day or daytime variabilty or assoc  cp or chest tightness, subjective wheeze overt sinus or hb symptoms. No unusual exp hx or h/o childhood pna/ asthma or knowledge of premature birth.  Sleeping ok without nocturnal  or early am exacerbation  of respiratory  c/o's or need for noct saba. Also denies any obvious fluctuation of symptoms with weather or environmental changes or other aggravating or alleviating factors except as outlined above   Current Medications, Allergies, Complete Past Medical History, Past Surgical History, Family History, and Social History were reviewed in [employer]  record.  ROS  The following are not active complaints unless bolded sore throat, dysphagia, dental problems, itching, sneezing,  nasal congestion or excess/ purulent secretions, ear ache,   fever, chills, sweats, unintended wt loss, pleuritic or exertional cp, hemoptysis,  orthopnea pnd or leg swelling, presyncope, palpitations, heartburn, abdominal pain, anorexia, nausea, vomiting, diarrhea  or change in bowel or urinary habits, change in stools or urine, dysuria,hematuria,  rash, arthralgias, visual complaints, headache, numbness weakness or ataxia or problems with walking or coordination,  change in mood/affect or memory.         Review of Systems     Objective:   Physical Exam amb male ? Slowed mentation   Wt Readings from Last 3 Encounters:  08/22/13 213 lb 9.6 oz (96.888 kg)  08/18/13 211 lb (95.709 kg)  08/18/13 211 lb (95.709 kg)     HEENT mild turbinate edema.  Oropharynx no thrush or excess pnd or cobblestoning.  No JVD or cervical adenopathy. Mild accessory muscle hypertrophy. Trachea midline, nl thryroid. Chest was hyperinflated by percussion with diminished breath sounds and moderate increased exp time without wheeze. Hoover sign positive at mid inspiration. Regular rate and rhythm without murmur gallop or rub or increase P2 or edema.  Abd: no hsm, nl excursion. Ext warm without cyanosis or clubbing.    cxr 08/16/13  1. Mildly improved vascular congestion and right lower lobe airspace  opacity, suggestive of improved edema.  2. Unchanged, loculated right pleural effusion. Possible trace  residual left pleural effusion.     Assessment:

## 2013-08-23 NOTE — Assessment & Plan Note (Signed)
1. Spiculated left upper lobe nodule is morphologically concerning  for possible bronchogenic carcinoma. Given the patient's multiple  other medical problems, consider chest CT follow-up in 4- 6 months  to assess stability. PET-CT could be performed if clinically  Warranted. Clearly not a candidate for early intervention >>> placed in tickle for recall 12/11/13

## 2013-08-23 NOTE — Assessment & Plan Note (Addendum)
DDX of  difficult airways managment all start with A and  include Adherence, Ace Inhibitors, Acid Reflux, Active Sinus Disease, Alpha 1 Antitripsin deficiency, Anxiety masquerading as Airways dz,  ABPA,  allergy(esp in young), Aspiration (esp in elderly), Adverse effects of DPI,  Active smokers, plus two Bs  = Bronchiectasis and Beta blocker use..and one C= CHF  Adherence is always the initial "prime suspect" and is a multilayered concern that requires a "trust but verify" approach in every patient - starting with knowing how to use medications, especially inhalers, correctly, keeping up with refills and understanding the fundamental difference between maintenance and prns vs those medications only taken for a very short course and then stopped and not refilled.  - desperately needs med rec.  To keep things simple, I have asked the patient to first separate medicines that are perceived as maintenance, that is to be taken daily "no matter what", from those medicines that are taken on only on an as-needed basis and I have given the patient examples of both, and then return to see our NP to generate a  detailed  medication calendar which should be followed until the next physician sees the patient and updates it.     ?acei effects contributing to cough > try off x one week and on arb and regroup then  ? chf > recheck cxr in one week  ? Adverse effects of advair > he doesn't think it's helping and Dr Elsworth Soho already rec d/c so should leave it off for now and continue symbicort  ? Active smoking > clarify on return whether he's really quit but emphasized he can't smoke on 02

## 2013-08-23 NOTE — Assessment & Plan Note (Signed)
-   rx  4lpm 24/7 as of ov 08/22/13 as he has chf/pah

## 2013-08-26 ENCOUNTER — Encounter: Payer: Self-pay | Admitting: Internal Medicine

## 2013-08-28 ENCOUNTER — Encounter: Payer: Self-pay | Admitting: *Deleted

## 2013-08-30 ENCOUNTER — Ambulatory Visit (INDEPENDENT_AMBULATORY_CARE_PROVIDER_SITE_OTHER): Payer: Medicare Other | Admitting: Adult Health

## 2013-08-30 ENCOUNTER — Encounter: Payer: Self-pay | Admitting: Adult Health

## 2013-08-30 VITALS — BP 102/64 | HR 90 | Temp 96.9°F | Ht 74.0 in | Wt 211.6 lb

## 2013-08-30 DIAGNOSIS — R911 Solitary pulmonary nodule: Secondary | ICD-10-CM

## 2013-08-30 DIAGNOSIS — J449 Chronic obstructive pulmonary disease, unspecified: Secondary | ICD-10-CM

## 2013-08-30 DIAGNOSIS — J9610 Chronic respiratory failure, unspecified whether with hypoxia or hypercapnia: Secondary | ICD-10-CM

## 2013-08-30 NOTE — Assessment & Plan Note (Signed)
O2 sats are improved, will decrease O2 2l/m continuous flow

## 2013-08-30 NOTE — Patient Instructions (Signed)
Follow up Dr. Elsworth Soho  In 3-4 weeks with PFT  Follow med calendar closely and bring to each visit.  May decrease Oxygen 2l/m continuously.  Please contact office for sooner follow up if symptoms do not improve or worsen or seek emergency care

## 2013-08-30 NOTE — Progress Notes (Signed)
Subjective:     Patient ID: [name], male   DOB: 12/06/1945 MRN: [id number]  HPI 68 yo male ? quit smoking on admit:  Admit date: 08/12/2013  Discharge date: 08/18/2013  Time spent: > 35 minutes  Recommendations for Outpatient Follow-up:  1. Patient will need continued follow up with pulmonologist and cardiology 2. Discharged on supplemental oxygen 3. Will need pulmonary nodule monitoring as indicated below. Discharge Diagnoses:  Active Problems:  COPD  CAD (coronary artery disease)  Atrial fibrillation  Cardiomyopathy  Ejection fraction < 50%  ICD (implantable cardioverter-defibrillator), biventricular, in situ  Pulmonary hypertension  Chronic systolic CHF (congestive heart failure)  Status post mitral valve replacement with bioprosthetic valve  Hypoxia  H/O atrioventricular nodal ablation  H/O intracranial hemorrhage  H/O endocarditis  H/O: CVA (cerebrovascular accident)  Solitary pulmonary nodule  Acute on chronic respiratory failure  Lung nodule  Emphysema/COPD   08/23/2013 1st [facility] office visit/ [place]/  Comprehensive transition of care  Chief Complaint  Patient presents with  . Follow-up    PHOS x 4days,no sob,denies wheezing,denies cp or tightness,cough-clear,   main complaint is cough day > night min productive on acei.  >>d/c ACE , rx Avapro   08/30/2013 Follow up and Med review  Patient returns for a followup visit and medication review. We reviewed all his medications and organized them into a medication calendar with patient education. His wife helps him with his medications and it appears that he is taking them correctly. Last visit. Patient was changed over from head to ACE inhibitor to Avapro. Patient denies any chest pain, orthopnea, PND, leg swelling.  Patient has an occasional cough, mainly dry. He denies any significant shortness, of breath. Wife says that he is a "couch potato." And she also says that he continues to smoke  intermittently     Current Medications, Allergies, Complete Past Medical History, Past Surgical History, Family History, and Social History were reviewed in [employer] record.  ROS  The following are not active complaints unless bolded sore throat, dysphagia, dental problems, itching, sneezing,  nasal congestion or excess/ purulent secretions, ear ache,   fever, chills, sweats, unintended wt loss, pleuritic or exertional cp, hemoptysis,  orthopnea pnd or leg swelling, presyncope, palpitations, heartburn, abdominal pain, anorexia, nausea, vomiting, diarrhea  or change in bowel or urinary habits, change in stools or urine, dysuria,hematuria,  rash, arthralgias, visual complaints, headache, numbness weakness or ataxia or problems with walking or coordination,  change in mood/affect or memory.              Objective:   Physical Exam amb male   HEENT mild turbinate edema.  Oropharynx no thrush or excess pnd or cobblestoning.  No JVD or cervical adenopathy. Mild accessory muscle hypertrophy. Trachea midline, nl thryroid. Chest was hyperinflated by percussion with diminished breath sounds and moderate increased exp time without wheeze. Hoover sign positive at mid inspiration. Regular rate and rhythm without murmur gallop or rub or increase P2 or edema.  Abd: no hsm, nl excursion. Ext warm without cyanosis or clubbing.    cxr 08/16/13  1. Mildly improved vascular congestion and right lower lobe airspace  opacity, suggestive of improved edema.  2. Unchanged, loculated right pleural effusion. Possible trace  residual left pleural effusion.     Assessment:

## 2013-08-30 NOTE — Assessment & Plan Note (Signed)
Needs repeat CT chest in ~12/2013

## 2013-08-30 NOTE — Assessment & Plan Note (Signed)
Continue on Symbicort  Patient's medications were reviewed today and patient education was given. Computerized medication calendar was adjusted/completed PFT in  3-4 weeks

## 2013-09-04 ENCOUNTER — Encounter: Payer: Self-pay | Admitting: *Deleted

## 2013-09-06 IMAGING — CR DG CHEST 2V
2 series · 2 of 2 positions shown · non-contrast
Comparison: 05/23/2010

CLINICAL DATA: Pre-procedure

CHEST - 2 VIEW

[view not recorded (1 of 2)]
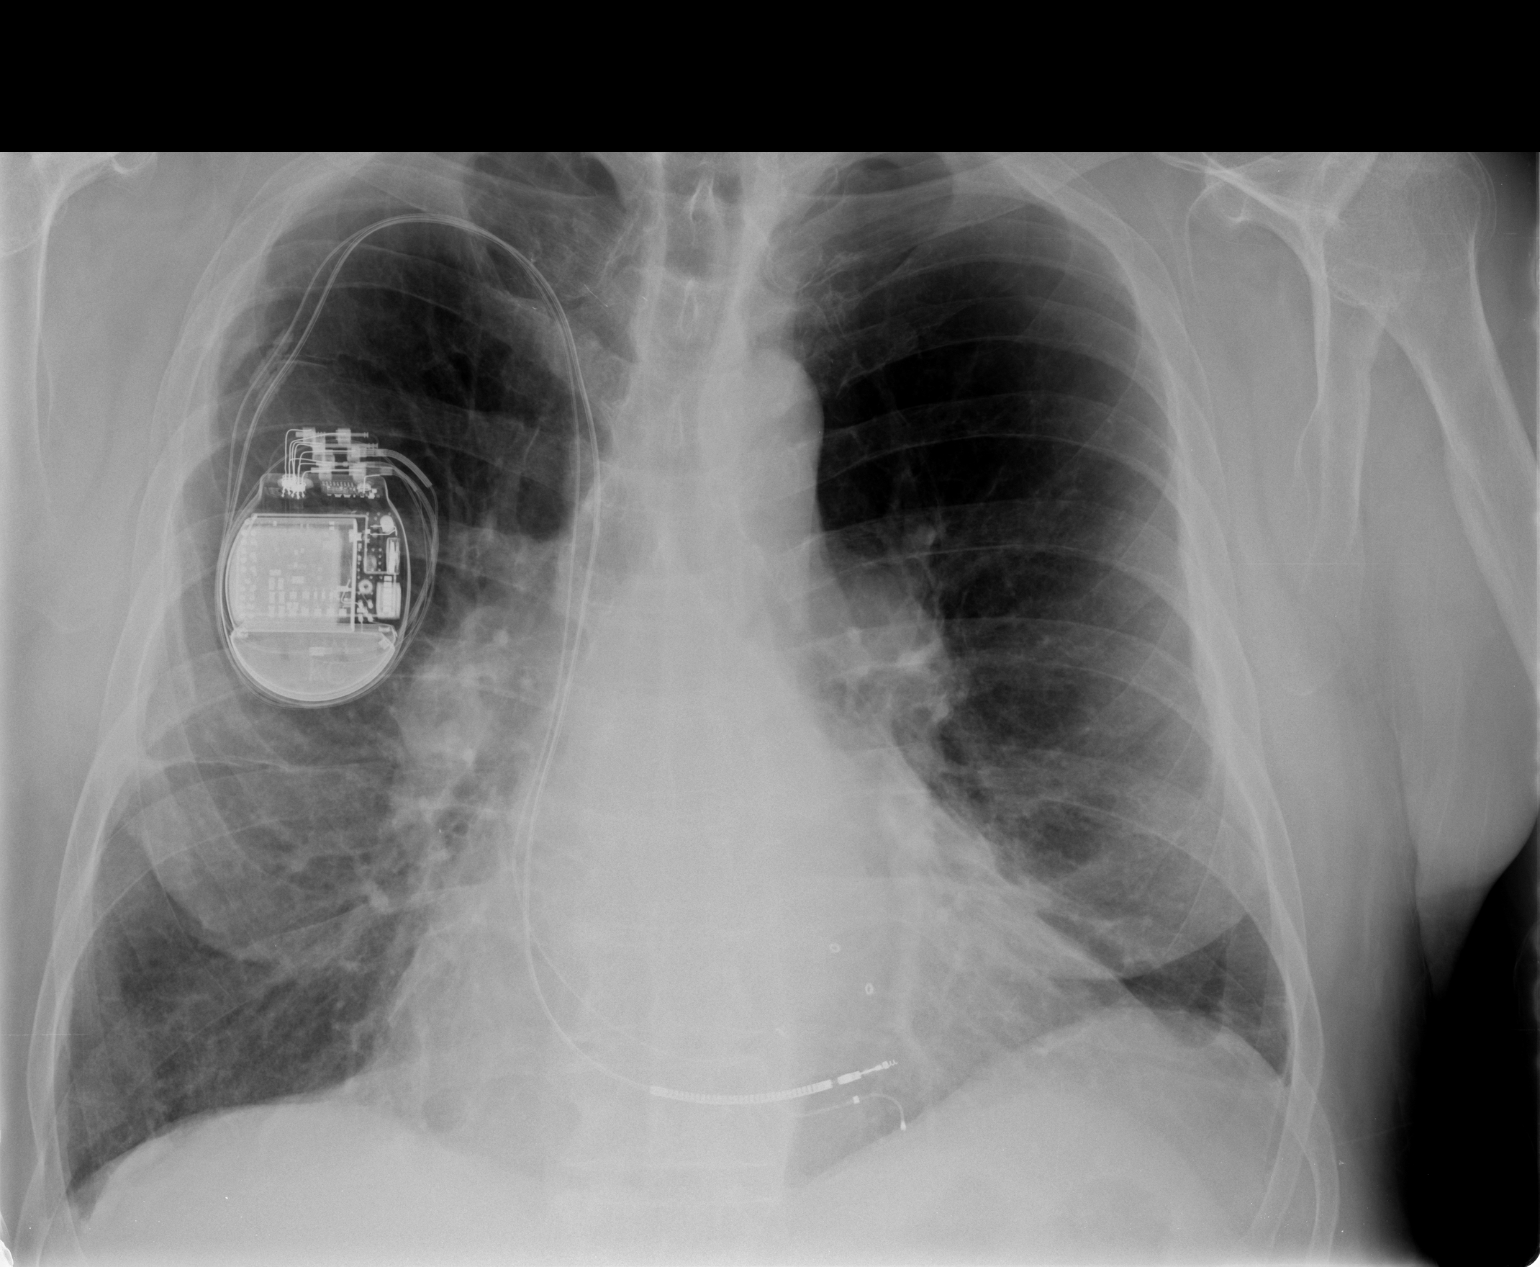

[view not recorded (2 of 2)]
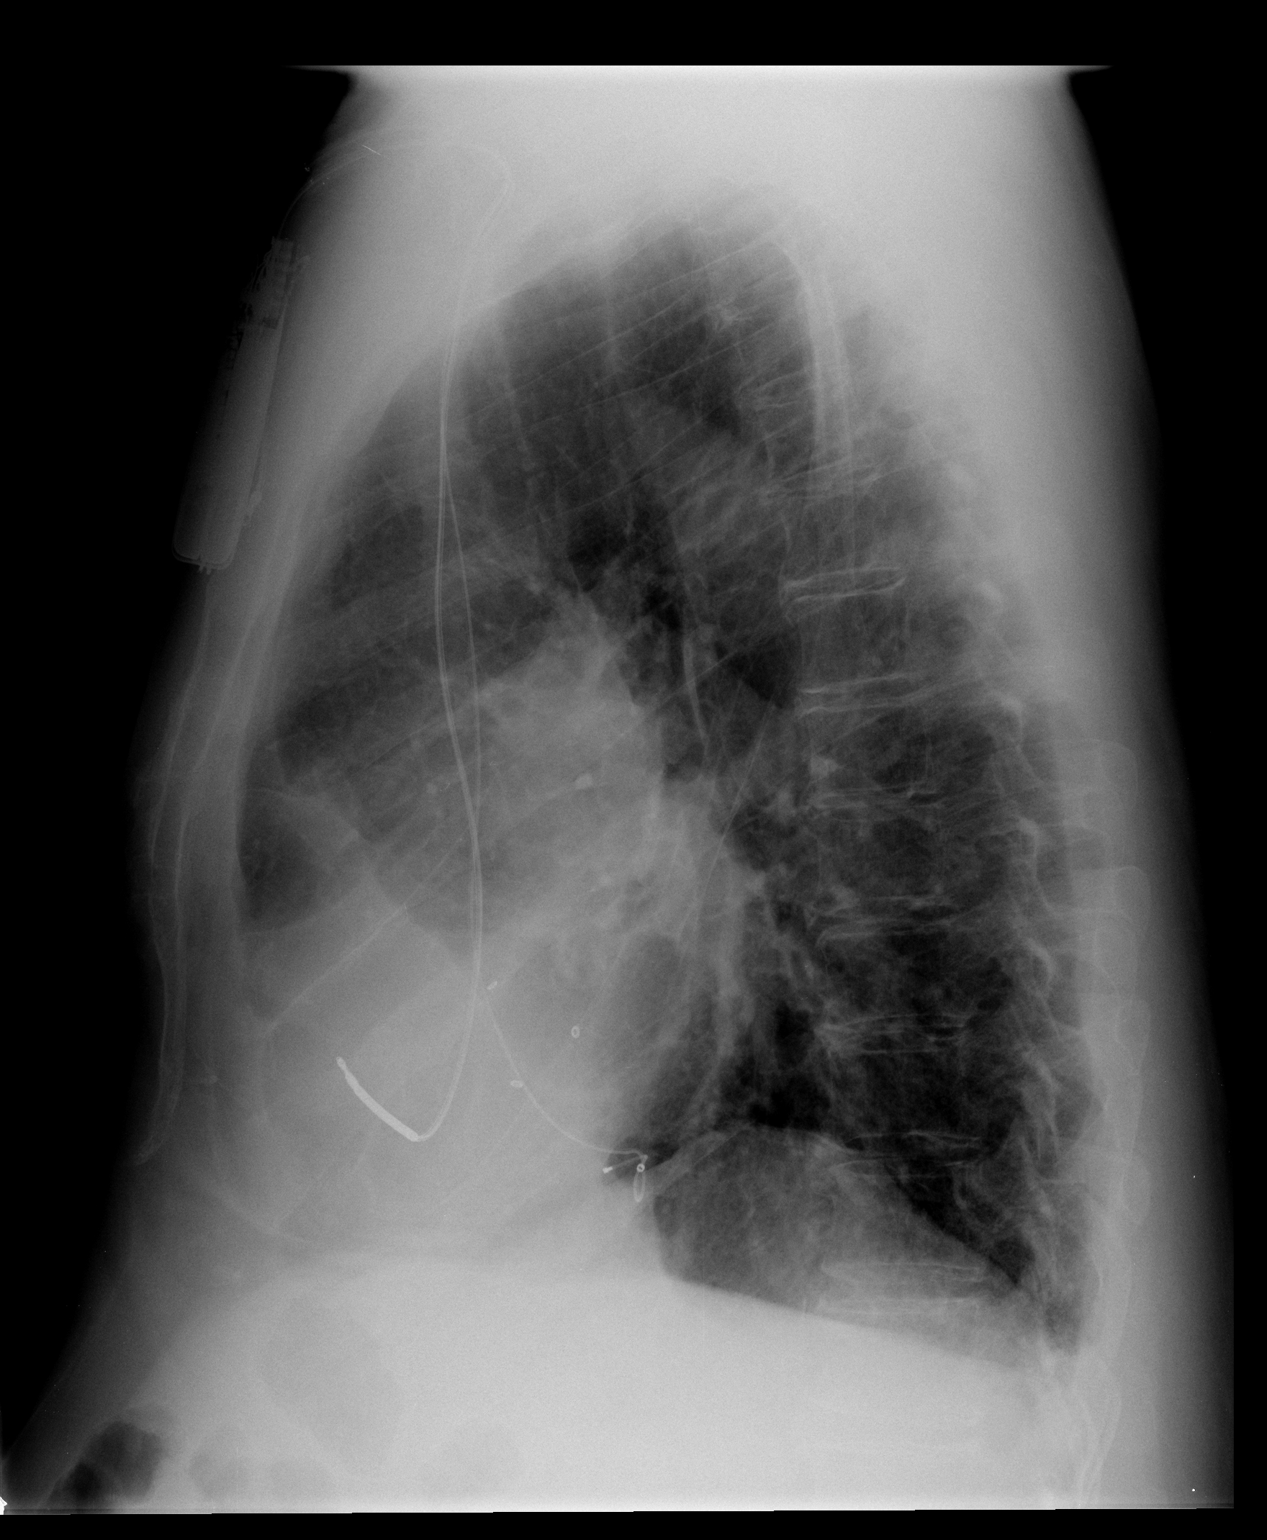

[2 of 2 positions shown; findings below may reference images not displayed]

FINDINGS: Right chest wall battery pack with lead tips unchanged in
position.  Cardiomegaly.  Central vascular congestion.  Mild left
hemidiaphragm elevation.  No definite consolidation or
pneumothorax.  No pleural effusion.  No acute osseous finding.
IMPRESSION: Cardiomegaly and central vascular fullness.  No focal
consolidation.

## 2013-09-07 ENCOUNTER — Other Ambulatory Visit: Payer: Self-pay | Admitting: Family Medicine

## 2013-09-08 ENCOUNTER — Other Ambulatory Visit: Payer: Self-pay | Admitting: Internal Medicine

## 2013-09-09 NOTE — Addendum Note (Signed)
Addended by: Parke Poisson E on: 09/09/2013 11:25 AM   Modules accepted: Orders, Medications

## 2013-09-11 ENCOUNTER — Other Ambulatory Visit: Payer: Self-pay | Admitting: Internal Medicine

## 2013-09-11 MED ORDER — IRBESARTAN 75 MG PO TABS: 75.0000 mg | ORAL_TABLET | Freq: Every day | ORAL | Status: DC

## 2013-09-24 ENCOUNTER — Other Ambulatory Visit: Payer: Self-pay | Admitting: Internal Medicine

## 2013-09-24 DIAGNOSIS — J449 Chronic obstructive pulmonary disease, unspecified: Secondary | ICD-10-CM

## 2013-09-25 ENCOUNTER — Ambulatory Visit (INDEPENDENT_AMBULATORY_CARE_PROVIDER_SITE_OTHER): Payer: Medicare Other | Admitting: Pulmonary Disease

## 2013-09-25 DIAGNOSIS — J449 Chronic obstructive pulmonary disease, unspecified: Secondary | ICD-10-CM

## 2013-09-25 LAB — PULMONARY FUNCTION TEST
DL/VA % PRED: 47 %
DL/VA: 2.23 ml/min/mmHg/L
DLCO unc % pred: 32 %
DLCO unc: 11 ml/min/mmHg
FEF 25-75 POST: 1.5 L/s
FEF 25-75 PRE: 1.17 L/s
FEF2575-%CHANGE-POST: 27 %
FEF2575-%PRED-POST: 54 %
FEF2575-%Pred-Pre: 42 %
FEV1-%Change-Post: 8 %
FEV1-%Pred-Post: 67 %
FEV1-%Pred-Pre: 61 %
FEV1-PRE: 2.18 L
FEV1-Post: 2.37 L
FEV1FVC-%CHANGE-POST: 1 %
FEV1FVC-%PRED-PRE: 86 %
FEV6-%CHANGE-POST: 7 %
FEV6-%PRED-POST: 78 %
FEV6-%Pred-Pre: 73 %
FEV6-Post: 3.52 L
FEV6-Pre: 3.28 L
FEV6FVC-%Change-Post: 0 %
FEV6FVC-%Pred-Post: 103 %
FEV6FVC-%Pred-Pre: 102 %
FVC-%CHANGE-POST: 7 %
FVC-%PRED-POST: 77 %
FVC-%Pred-Pre: 71 %
FVC-POST: 3.65 L
FVC-Pre: 3.4 L
POST FEV6/FVC RATIO: 98 %
PRE FEV1/FVC RATIO: 64 %
Post FEV1/FVC ratio: 65 %
Pre FEV6/FVC Ratio: 98 %
RV % pred: 83 %
RV: 2.03 L
TLC % PRED: 77 %
TLC: 5.61 L

## 2013-09-25 NOTE — Progress Notes (Signed)
PFT done today. 

## 2013-09-26 ENCOUNTER — Encounter: Payer: Self-pay | Admitting: Pulmonary Disease

## 2013-09-26 ENCOUNTER — Ambulatory Visit (INDEPENDENT_AMBULATORY_CARE_PROVIDER_SITE_OTHER): Payer: Medicare Other | Admitting: Pulmonary Disease

## 2013-09-26 VITALS — BP 112/62 | HR 74 | Ht 74.0 in | Wt 210.8 lb

## 2013-09-26 DIAGNOSIS — I428 Other cardiomyopathies: Secondary | ICD-10-CM | POA: Diagnosis not present

## 2013-09-26 DIAGNOSIS — I2789 Other specified pulmonary heart diseases: Secondary | ICD-10-CM | POA: Diagnosis not present

## 2013-09-26 DIAGNOSIS — J449 Chronic obstructive pulmonary disease, unspecified: Secondary | ICD-10-CM

## 2013-09-26 DIAGNOSIS — R911 Solitary pulmonary nodule: Secondary | ICD-10-CM | POA: Diagnosis not present

## 2013-09-26 DIAGNOSIS — I2510 Atherosclerotic heart disease of native coronary artery without angina pectoris: Secondary | ICD-10-CM | POA: Diagnosis not present

## 2013-09-26 DIAGNOSIS — J9610 Chronic respiratory failure, unspecified whether with hypoxia or hypercapnia: Secondary | ICD-10-CM

## 2013-09-26 DIAGNOSIS — I429 Cardiomyopathy, unspecified: Secondary | ICD-10-CM

## 2013-09-26 DIAGNOSIS — I272 Pulmonary hypertension, unspecified: Secondary | ICD-10-CM

## 2013-09-26 NOTE — Patient Instructions (Signed)
You have moderate COPD Stay on symbicort Referral to pulmonary rehab Make appt with Dr Kai Levins also have pulmonary hypertension - use oxygen as recommended You have 1.5 cm nodule in left upper lung -PET scan will be scheduled

## 2013-09-26 NOTE — Assessment & Plan Note (Signed)
You have moderate COPD Stay on symbicort Referral to pulmonary rehab

## 2013-09-26 NOTE — Progress Notes (Signed)
Subjective:    Patient ID: [name], male    DOB: 1945/10/15, 68 y.o.   MRN: [id number]  HPI   68 year old male smoker with ischemic cardiomyopathy and chronic dyspnea. Admitted 08/2013  from cardiology office for chest pain & resting O2 sats of 79% and working Dx of CAP.   Echo 2/3: EF 30-35% akinesis of the apex, apical anterior, septal, inferior walls and hypokinesis of the mid and basal inferior and inferolateral walls Right ventricle: The cavity size was mildly dilated.Systolic function was moderately reduced. Right atrium: The atrium was moderately dilated.  Cath 08/16/13 PCWP 25, Mean RA 11 mmHg  RV 54/5 mmHg  PA: 56/29 mean 39 mmHg  CO: 3.39   He has a 40+ PY hx of smoking  No discernible benefit from Advair (has been on for several years)  He had Chronic appearing pleural disease on right on CT chest & LUL 1.5 cm oblong opacity worrisome for malignancy    09/26/2013  Chief Complaint  Patient presents with  . Follow-up    Pt denies any complaints of cough, SOB, or congestion.   Accompanied by wife PFTs 09/2013 - FEV1 2.18- 61%, ratio 64, FVC 3.40 -71%, DLCO 11.0- 32% Desatn to 87% on RA, improved with 2L o2 Overall he is breathing better, goes without oxygen for short periods, no orhtopnea, PND, pedal edema is resolved  Past Medical History  Diagnosis Date  . CAD (coronary artery disease)     CABG was not done at that time mitral valve replacement done through lateral thoracotomy  . Hypertension   . Endocarditis     Bacterial, 2009  . Colon polyps   . Dyslipidemia   . COPD (chronic obstructive pulmonary disease)   . Atrial fibrillation     AV Node ablation January, 2010, for rapid atrial fib  . Cardiomyopathy     Ischemic  . Ejection fraction < 50%     EF 30%, echo, December, 2010, apex dyskinetic, moderate hypokinesis posterior wall and inferior wall  . Atrial septal defect     Closed with surgery January, 2010  . Mitral regurgitation     Severe,Mitral  valve replacement  . Intracranial hemorrhage     Coumadin cannot be used because of the history of his bleed  . Renal artery stenosis     Mild by history  . Pulmonary hypertension     Moderate  . HLD (hyperlipidemia)   . Status post mitral valve replacement with bioprosthetic valve   . Automatic implantable cardioverter-defibrillator in situ     LV dysfunction and pacer needed for AV node lesion  . CVA (cerebral vascular accident) 2009    denies residual on 08/14/2013  . CHF (congestive heart failure)   . Myocardial infarction 2010  . Dysrhythmia   . Pre-diabetes   . Pneumonia     "this is my first case" (08/14/2013)     Review of Systems neg for any significant sore throat, dysphagia, itching, sneezing, nasal congestion or excess/ purulent secretions, fever, chills, sweats, unintended wt loss, pleuritic or exertional cp, hempoptysis, orthopnea pnd or change in chronic leg swelling. Also denies presyncope, palpitations, heartburn, abdominal pain, nausea, vomiting, diarrhea or change in bowel or urinary habits, dysuria,hematuria, rash, arthralgias, visual complaints, headache, numbness weakness or ataxia.     Objective:   Physical Exam  Gen. Pleasant, well-nourished, in no distress, normal affect ENT - no lesions, no post nasal drip Neck: No JVD, no thyromegaly, no carotid bruits  Lungs: no use of accessory muscles, no dullness to percussion, clear without rales or rhonchi  Cardiovascular: Rhythm regular, heart sounds  normal, no murmurs or gallops, no peripheral edema Abdomen: soft and non-tender, no hepatosplenomegaly, BS normal. Musculoskeletal: No deformities, no cyanosis or clubbing Neuro:  alert, non focal       Assessment & Plan:

## 2013-09-26 NOTE — Assessment & Plan Note (Signed)
Ct 2L/m O2 on exertion & sleep

## 2013-09-26 NOTE — Assessment & Plan Note (Signed)
PET scan Optimise cardiopulm function, may need surgery- note low DLCO

## 2013-09-26 NOTE — Assessment & Plan Note (Signed)
You also have pulmonary hypertension - use oxygen as recommended -2L during exertion & sleep  -Other than treatment of underlying causes, there is no specific therapy for secondary PAH. Would not consider pulmonary vasodilators here You have 1.5 cm nodule in left upper lung -PET scan will be scheduled

## 2013-09-26 NOTE — Progress Notes (Deleted)
   Subjective:    Patient ID: [name], male    DOB: Apr 13, 1946, 68 y.o.   MRN: [id number]  HPI    Review of Systems     Objective:   Physical Exam        Assessment & Plan:

## 2013-09-26 NOTE — Assessment & Plan Note (Signed)
Make appt with Dr Ron Parker May need cardiac clearance & optimisation if LUL nodule does turn out to be cancer

## 2013-09-27 ENCOUNTER — Encounter: Payer: Self-pay | Admitting: Internal Medicine

## 2013-09-29 ENCOUNTER — Other Ambulatory Visit: Payer: Self-pay | Admitting: Family Medicine

## 2013-10-07 ENCOUNTER — Telehealth (HOSPITAL_COMMUNITY): Payer: Self-pay

## 2013-10-07 NOTE — Telephone Encounter (Signed)
Spoke with patient regarding Pulmonary Rehab.  Patient is interested in program but wants to start in the Fall because he and his wife go to the beach all summer.  I will follow up with Herbie Baltimore in November.

## 2013-10-13 ENCOUNTER — Other Ambulatory Visit: Payer: Self-pay | Admitting: Family Medicine

## 2013-10-16 ENCOUNTER — Other Ambulatory Visit: Payer: Self-pay | Admitting: Pulmonary Disease

## 2013-10-26 ENCOUNTER — Other Ambulatory Visit: Payer: Self-pay | Admitting: Family Medicine

## 2013-10-28 ENCOUNTER — Other Ambulatory Visit (INDEPENDENT_AMBULATORY_CARE_PROVIDER_SITE_OTHER): Payer: Medicare Other

## 2013-10-28 DIAGNOSIS — R7301 Impaired fasting glucose: Secondary | ICD-10-CM

## 2013-10-28 LAB — BASIC METABOLIC PANEL
BUN: 19 mg/dL (ref 6–23)
CHLORIDE: 99 meq/L (ref 96–112)
CO2: 32 meq/L (ref 19–32)
Calcium: 9.3 mg/dL (ref 8.4–10.5)
Creatinine, Ser: 1 mg/dL (ref 0.4–1.5)
GFR: 81.23 mL/min (ref >60.00)
Glucose, Bld: 174 mg/dL — ABNORMAL HIGH (ref 70–99)
Potassium: 4.4 mEq/L (ref 3.5–5.1)
SODIUM: 139 meq/L (ref 135–145)

## 2013-10-28 LAB — HEMOGLOBIN A1C: Hgb A1c MFr Bld: 7.6 % — ABNORMAL HIGH (ref 4.6–6.5)

## 2013-10-28 LAB — MICROALBUMIN / CREATININE URINE RATIO
CREATININE, U: 57.3 mg/dL
MICROALB UR: 2.7 mg/dL — AB (ref 0.0–1.9)
MICROALB/CREAT RATIO: 4.7 mg/g (ref 0.0–30.0)

## 2013-10-29 ENCOUNTER — Ambulatory Visit (HOSPITAL_COMMUNITY)
Admission: RE | Admit: 2013-10-29 | Discharge: 2013-10-29 | Disposition: A | Discharge status: Home / Self Care | Payer: Medicare Other | Source: Ambulatory Visit | Attending: Pulmonary Disease | Admitting: Pulmonary Disease

## 2013-10-29 DIAGNOSIS — J438 Other emphysema: Secondary | ICD-10-CM | POA: Diagnosis not present

## 2013-10-29 DIAGNOSIS — I2510 Atherosclerotic heart disease of native coronary artery without angina pectoris: Secondary | ICD-10-CM | POA: Insufficient documentation

## 2013-10-29 DIAGNOSIS — Z95810 Presence of automatic (implantable) cardiac defibrillator: Secondary | ICD-10-CM | POA: Insufficient documentation

## 2013-10-29 DIAGNOSIS — R911 Solitary pulmonary nodule: Secondary | ICD-10-CM | POA: Diagnosis not present

## 2013-10-29 DIAGNOSIS — I517 Cardiomegaly: Secondary | ICD-10-CM | POA: Diagnosis not present

## 2013-10-29 DIAGNOSIS — I700 Atherosclerosis of aorta: Secondary | ICD-10-CM | POA: Insufficient documentation

## 2013-10-29 LAB — GLUCOSE, CAPILLARY: Glucose-Capillary: 161 mg/dL — ABNORMAL HIGH (ref 70–99)

## 2013-10-29 MED ORDER — FLUDEOXYGLUCOSE F - 18 (FDG) INJECTION
10.3000 | Freq: Once | INTRAVENOUS | Status: AC | PRN
  Administered 2013-10-29: 10.3 via INTRAVENOUS

## 2013-11-01 ENCOUNTER — Ambulatory Visit (INDEPENDENT_AMBULATORY_CARE_PROVIDER_SITE_OTHER): Payer: Medicare Other | Admitting: Cardiology

## 2013-11-01 ENCOUNTER — Encounter: Payer: Self-pay | Admitting: Cardiology

## 2013-11-01 VITALS — BP 108/58 | HR 75 | Ht 74.0 in | Wt 208.1 lb

## 2013-11-01 DIAGNOSIS — Z8679 Personal history of other diseases of the circulatory system: Secondary | ICD-10-CM

## 2013-11-01 DIAGNOSIS — Q2111 Secundum atrial septal defect: Secondary | ICD-10-CM

## 2013-11-01 DIAGNOSIS — Z9889 Other specified postprocedural states: Secondary | ICD-10-CM

## 2013-11-01 DIAGNOSIS — I4891 Unspecified atrial fibrillation: Secondary | ICD-10-CM | POA: Diagnosis not present

## 2013-11-01 DIAGNOSIS — Z01810 Encounter for preprocedural cardiovascular examination: Secondary | ICD-10-CM | POA: Diagnosis not present

## 2013-11-01 DIAGNOSIS — I340 Nonrheumatic mitral (valve) insufficiency: Secondary | ICD-10-CM

## 2013-11-01 DIAGNOSIS — R9430 Abnormal result of cardiovascular function study, unspecified: Secondary | ICD-10-CM

## 2013-11-01 DIAGNOSIS — I2510 Atherosclerotic heart disease of native coronary artery without angina pectoris: Secondary | ICD-10-CM | POA: Diagnosis not present

## 2013-11-01 DIAGNOSIS — Q211 Atrial septal defect, unspecified: Secondary | ICD-10-CM

## 2013-11-01 DIAGNOSIS — Z95810 Presence of automatic (implantable) cardiac defibrillator: Secondary | ICD-10-CM

## 2013-11-01 DIAGNOSIS — Z953 Presence of xenogenic heart valve: Secondary | ICD-10-CM

## 2013-11-01 DIAGNOSIS — J9610 Chronic respiratory failure, unspecified whether with hypoxia or hypercapnia: Secondary | ICD-10-CM

## 2013-11-01 DIAGNOSIS — R0989 Other specified symptoms and signs involving the circulatory and respiratory systems: Secondary | ICD-10-CM

## 2013-11-01 DIAGNOSIS — I219 Acute myocardial infarction, unspecified: Secondary | ICD-10-CM | POA: Diagnosis not present

## 2013-11-01 DIAGNOSIS — Z952 Presence of prosthetic heart valve: Secondary | ICD-10-CM

## 2013-11-01 DIAGNOSIS — I059 Rheumatic mitral valve disease, unspecified: Secondary | ICD-10-CM

## 2013-11-01 DIAGNOSIS — R911 Solitary pulmonary nodule: Secondary | ICD-10-CM

## 2013-11-01 DIAGNOSIS — I428 Other cardiomyopathies: Secondary | ICD-10-CM | POA: Insufficient documentation

## 2013-11-01 NOTE — Patient Instructions (Signed)
Your physician recommends that you continue on your current medications as directed. Please refer to the Current Medication list given to you today.  Your physician recommends that you schedule a follow-up appointment in: 3 months  

## 2013-11-01 NOTE — Progress Notes (Signed)
Patient ID: AIRON [name], male   DOB: Apr 12, 1946, 68 y.o.   MRN: [id number]    HPI   The patient is seen after his hospitalization for severe hypoxia. I saw him last in the office on August 12, 2013. He walked in with a very low O2 saturation. He was then admitted to the hospital.  I have spent an extensive amount of time re\re reviewing all of the complexities of his prior medical history and updating the current problem list. The patient had atrial fibrillation in the past. He was on Coumadin. He had an intracerebral bleed in June, 2009 while his INR was 2.2. Therefore it was felt that he could never be anticoagulated again. In October, 2009 he presented with a myocardial infarction. Catheterization was not done because anticoagulation could not be used. Ejection fraction was 35%. He had persistent rapid atrial fibrillation in the hospital. Because of this he had AV node ablation. An ICD pacemaker was placed for his AV node ablation and left ventricular dysfunction. 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 Antibiotics were started. His ICD was removed on July 15, 2008. Catheterization was done revealing no significant coronary disease. He had severe mitral regurgitation with his endocarditis. He underwent bioprosthetic mitral valve replacement through a thoracotomy on July 17, 2008. At the same time and ASD was closed. His atrial appendage was tied off. Maze procedure could not be done because the procedure was done through a thoracotomy. On July 23, 2008 he received a new ICD. The patient had a remarkable recovery over the years.  He then presented to my office with very low O2 sat on August 12, 2013. He was admitted to the hospital. The exact etiology is not completely clear to me. I believe there was a significant pulmonary component, but I am still not clear about the pulmonary diagnosis. The patient did have some pleural effusions that he eventually improved. He  did not have marked diuresis. 2-D echo did show that his ejection fraction remains 30-35%. He had a right heart cath that showed significant pulmonary hypertension. In addition his wedge pressure was 25 and right atrial pressure was 11. It was felt that he might need these filling pressure because of his low cardiac output. He has improved since being out of the hospital. He has lost 8 pounds. However he has also been dieting because of elevated glucose. He went home on oxygen. He is now using it in frequently. When he walked in the office today his O2 sat was 95%. I am therefore not sure exactly why he has improved. I wonder if he did not have some type of pneumonic process along with worsening CHF which has now improved.  There was an additional finding in the hospital. He has a left upper lobe nodule. Most recently he saw Dr. Elsworth Soho of the pulmonary team. A PET scan was ordered. I have seen the report and it does show a spiculated lesion that does have hyperactivity. The patient has not been informed of the exact result by the pulmonary team yet. I told him that the study was abnormal. I told him that I would ask the pulmonary team to be in touch with him soon. I am aware that there is question that he might require surgery for this lesion. Part of the evaluation today is to see if he can be cleared from a cardiac basis.  He actually feels much better than when I saw him August 12, 2013.  Allergies  Allergen Reactions  . Anticoagulant Compound     Pt had intracranial bleed, therefore all anticoagulation is contra-indicated per Dr. Ron Parker.  . Warfarin Sodium     Current Outpatient Prescriptions  Medication Sig Dispense Refill  . acetaminophen (TYLENOL) 500 MG tablet Per bottle as needed for pain      . amoxicillin (AMOXIL) 500 MG capsule 4 capsules 1 hour prior to dental procedures      . aspirin EC 81 MG tablet Take 1 tablet (81 mg total) by mouth daily.  90 tablet  3  . atorvastatin (LIPITOR) 20  MG tablet Take 1 tablet (20 mg total) by mouth daily.  90 tablet  0  . benzonatate (TESSALON) 100 MG capsule Take 1 capsule (100 mg total) by mouth 3 (three) times daily as needed for cough.  20 capsule  0  . carvedilol (COREG) 3.125 MG tablet TAKE 1 TABLET BY MOUTH TWICE A DAY WITH MEALS  180 tablet  0  . furosemide (LASIX) 40 MG tablet Take 40 mg by mouth daily. May take 1 extra daily as needed      . irbesartan (AVAPRO) 75 MG tablet Take 1 tablet (75 mg total) by mouth daily.  90 tablet  3  . isosorbide dinitrate (ISORDIL) 10 MG tablet TAKE 1 TABLET BY MOUTH 3 TIMES A DAY  90 tablet  1  . nitroGLYCERIN (NITROSTAT) 0.4 MG SL tablet Place 1 tablet (0.4 mg total) under the tongue every 5 (five) minutes as needed. For chest pain  25 tablet  1  . SYMBICORT 160-4.5 MCG/ACT inhaler INHALE 2 PUFFS INTO THE LUNGS TWICE A DAY  10.2 g  6   No current facility-administered medications for this visit.    History   Social History  . Marital Status: Married    Spouse Name: [name]    Number of Children: 0  . Years of Education: College   Occupational History  . Retired     Tour manager   Social History Main Topics  . Smoking status: Former Smoker -- 1.00 packs/day for 45 years    Types: Cigarettes  . Smokeless tobacco: Never Used     Comment: 08/14/2013 "quit smoking in 2009"  . Alcohol Use: Yes     Comment: 08/14/2013 "used to drink beer; quit:in 1982"  . Drug Use: No  . Sexual Activity: Yes   Other Topics Concern  . Not on file   Social History Narrative   Patient lives at home with his spouse.   Caffeine Use: none    Family History  Problem Relation Age of Onset  . Stomach cancer Father   . Cancer Father   . Stroke      family history  . Stroke Mother     Past Medical History  Diagnosis Date  . CAD (coronary artery disease)     CABG was not done at that time mitral valve replacement done through lateral thoracotomy  . Hypertension   . Endocarditis     Bacterial, 2009  .  Colon polyps   . Dyslipidemia   . COPD (chronic obstructive pulmonary disease)   . Atrial fibrillation     AV Node ablation January, 2010, for rapid atrial fib  . Cardiomyopathy     Ischemic  . Ejection fraction < 50%     EF 30%, echo, December, 2010, apex dyskinetic, moderate hypokinesis posterior wall and inferior wall  . Atrial septal defect     Closed with surgery January, 2010  .  Mitral regurgitation     Severe,Mitral valve replacement  . Intracranial hemorrhage     Coumadin cannot be used because of the history of his bleed  . Renal artery stenosis     Mild by history  . Pulmonary hypertension     Moderate  . HLD (hyperlipidemia)   . Status post mitral valve replacement with bioprosthetic valve   . Automatic implantable cardioverter-defibrillator in situ     LV dysfunction and pacer needed for AV node lesion  . CVA (cerebral vascular accident) 2009    denies residual on 08/14/2013  . CHF (congestive heart failure)   . Myocardial infarction 2010  . Dysrhythmia   . Pre-diabetes   . Pneumonia     "this is my first case" (08/14/2013)    Past Surgical History  Procedure Laterality Date  . Mitral valve replacement  07/17/2008    Tissue valve / Echo, December, 2010, valve working well  . Cardiac defibrillator placement  ~ 2010    [employer] Jude, Remote  . Penile prosthesis implant    . Appendectomy    . Mass biopsy Left     neck mass  . Leep    . Thoracotomy Right     right, for mitral valve replacement  . Cataract extraction w/ intraocular lens  implant, bilateral Bilateral   . Cardiac valve replacement    . Av node ablation  07/2008    for rapid atrial fib    Patient Active Problem List   Diagnosis Date Noted  . Chronic respiratory failure assoc with chf/ [facility] 08/12/2013    Priority: High  . Dyslipidemia     Priority: High  . Status post mitral valve replacement with bioprosthetic valve     Priority: Medium  . Atrial fibrillation     Priority: Medium  . Ejection  fraction < 50%     Priority: Medium  . ICD (implantable cardioverter-defibrillator), biventricular, in situ     Priority: Medium  . Myocardial infarction 11/01/2013  . Cardiomyopathy, nonischemic 11/01/2013  . Solitary pulmonary nodule 08/14/2013  . H/O intracranial hemorrhage 08/13/2013  . H/O endocarditis 08/13/2013  . H/O: CVA (cerebrovascular accident) 08/13/2013  . H/O atrioventricular nodal ablation   . Fasting hyperglycemia 08/05/2013  . Chronic systolic CHF (congestive heart failure) 01/08/2012  . Hypertension   . Atrial septal defect   . Mitral regurgitation   . Renal artery stenosis   . Pulmonary hypertension   . CARCINOMA, SKIN, SQUAMOUS CELL 04/20/2009  . HYPERGLYCEMIA, FASTING 04/01/2009  . COLONIC POLYPS, ADENOMATOUS 03/22/2007  . COPD GOLD II 01/11/2007  . GERD 01/11/2007    ROS   Patient denies fever, chills, headache, sweats, rash, change in vision, change in hearing, chest pain, cough, nausea or vomiting, urinary symptoms. All other systems are reviewed and are negative.  PHYSICAL EXAM  Patient is here with his wife. He is oriented to person time and place. Affect is normal. There is no jugulovenous distention. Head is atraumatic. Conjunctiva are normal. Lungs are clear. Respiratory effort is nonlabored. Cardiac exam reveals S1 and S2. The abdomen is soft. There is no peripheral edema. There are no musculoskeletal deformities. There are no skin rashes.  Filed Vitals:   11/01/13 1008  BP: 108/58  Pulse: 75  Height: 6\' 2"  (1.88 m)  Weight: 208 lb 1.9 oz (94.403 kg)  SpO2: 95%    ASSESSMENT & PLAN

## 2013-11-01 NOTE — Assessment & Plan Note (Signed)
It appears that the patient's PET scan is abnormal. He may well need surgery. There is question in the report that a biopsy could be done first. I will leave this evaluation to Dr. Elsworth Soho.

## 2013-11-01 NOTE — Assessment & Plan Note (Signed)
The patient can be cleared for pulmonary surgery if it is appropriate for his lung nodule. I have spent greater than 90 minutes today researching all aspects of his care over many many years. This was necessary so that I could understand fully his overall status. He does have left ventricular dysfunction. He has never had proven coronary disease. He does not have ventricular arrhythmias. He did have a recent significant episode with hypoxia in February, 2015. I outlined at great length that I am not completely sure of the etiology of this event. There probably was some component of congestive heart failure. This is stable at this time.

## 2013-11-01 NOTE — Assessment & Plan Note (Signed)
He had a myocardial infarction in October, 2009. He was not cath at that time. Catheterization December, 2009 revealed no significant coronary disease. We do not know the exact etiology of his infarction in October, 2009

## 2013-11-01 NOTE — Assessment & Plan Note (Signed)
He has underlying atrial fibrillation. He has AV node ablation. His left atrial appendage was tied off in January, 2010. Because of a prior intracerebral bleed he cannot be anticoagulated.

## 2013-11-01 NOTE — Assessment & Plan Note (Signed)
He is being followed and assessed by Dr. Elsworth Soho.

## 2013-11-01 NOTE — Assessment & Plan Note (Signed)
His cardiomyopathy is in fact nonischemic by standard criteria. He is on the appropriate medications that he can tolerate.

## 2013-11-01 NOTE — Assessment & Plan Note (Signed)
His mitral valve replacement has worked extremely well. It is a tissue valve because he cannot be anticoagulated.

## 2013-11-01 NOTE — Assessment & Plan Note (Signed)
I have outlined the history of his ICD in October 2 009 that was explanted prior to his mitral valve surgery. He received second ICD July 23, 2008. This has been stable

## 2013-11-04 ENCOUNTER — Encounter: Payer: Self-pay | Admitting: Family Medicine

## 2013-11-04 ENCOUNTER — Telehealth: Payer: Self-pay | Admitting: Family Medicine

## 2013-11-04 ENCOUNTER — Ambulatory Visit (INDEPENDENT_AMBULATORY_CARE_PROVIDER_SITE_OTHER): Payer: Medicare Other | Admitting: Family Medicine

## 2013-11-04 VITALS — BP 110/70 | Temp 98.3°F | Wt 210.0 lb

## 2013-11-04 DIAGNOSIS — E119 Type 2 diabetes mellitus without complications: Secondary | ICD-10-CM

## 2013-11-04 DIAGNOSIS — I2510 Atherosclerotic heart disease of native coronary artery without angina pectoris: Secondary | ICD-10-CM | POA: Diagnosis not present

## 2013-11-04 MED ORDER — GLUCOSE BLOOD VI STRP: ORAL_STRIP | Status: DC

## 2013-11-04 MED ORDER — METFORMIN HCL 500 MG PO TABS: ORAL_TABLET | ORAL | Status: DC

## 2013-11-04 MED ORDER — ACCU-CHEK SOFTCLIX LANCETS MISC: Status: DC

## 2013-11-04 NOTE — Telephone Encounter (Signed)
Pt would like you to give him a call when you get a chance

## 2013-11-04 NOTE — Telephone Encounter (Signed)
Rx sent to pharmacy   

## 2013-11-04 NOTE — Progress Notes (Signed)
Pre visit review using our clinic review tool, if applicable. No additional management support is needed unless otherwise documented below in the visit note. 

## 2013-11-04 NOTE — Progress Notes (Signed)
   Subjective:    Patient ID: [name], male    DOB: 02/04/1946, 68 y.o.   MRN: [id number]  HPI [name] is a 68 year old male who comes in today to discuss new-onset diabetes  His A1c in the past at the normal recently his blood sugar has gone up slowly it's now in the 177 range fasting with an A1c of 7.6%  Asymptomatic    Review of Systems Review of systems otherwise negative    Objective:   Physical Exam  Well-developed well-nourished male in no acute distress vital signs stable he is afebrile      Assessment & Plan:  New-onset diabetes type 2.........Marland Kitchen metformin 500 daily followup in one month.

## 2013-11-04 NOTE — Patient Instructions (Signed)
Metformin 500 mg........ one tablet prior to breakfast  Check a fasting blood sugar Monday Wednesday Friday  Return in one month for followup  When you return bring a record of all your blood sugar readings and the meter

## 2013-11-08 ENCOUNTER — Encounter: Payer: Self-pay | Admitting: Pulmonary Disease

## 2013-11-08 ENCOUNTER — Ambulatory Visit (INDEPENDENT_AMBULATORY_CARE_PROVIDER_SITE_OTHER): Payer: Medicare Other | Admitting: Emergency Medicine

## 2013-11-08 ENCOUNTER — Ambulatory Visit: Payer: Medicare Other | Admitting: Pulmonary Disease

## 2013-11-08 ENCOUNTER — Telehealth: Payer: Self-pay

## 2013-11-08 VITALS — BP 108/64 | HR 72 | Temp 98.2°F | Resp 16 | Ht 73.1 in | Wt 208.6 lb

## 2013-11-08 VITALS — BP 106/58 | HR 71 | Temp 98.0°F | Wt 208.6 lb

## 2013-11-08 DIAGNOSIS — L02219 Cutaneous abscess of trunk, unspecified: Secondary | ICD-10-CM | POA: Diagnosis not present

## 2013-11-08 DIAGNOSIS — L03319 Cellulitis of trunk, unspecified: Secondary | ICD-10-CM

## 2013-11-08 DIAGNOSIS — E119 Type 2 diabetes mellitus without complications: Secondary | ICD-10-CM

## 2013-11-08 DIAGNOSIS — L02215 Cutaneous abscess of perineum: Secondary | ICD-10-CM

## 2013-11-08 DIAGNOSIS — R911 Solitary pulmonary nodule: Secondary | ICD-10-CM

## 2013-11-08 MED ORDER — ACETAMINOPHEN-CODEINE #3 300-30 MG PO TABS: 1.0000 | ORAL_TABLET | ORAL | Status: DC | PRN

## 2013-11-08 MED ORDER — SULFAMETHOXAZOLE-TMP DS 800-160 MG PO TABS: 1.0000 | ORAL_TABLET | Freq: Two times a day (BID) | ORAL | Status: DC

## 2013-11-08 NOTE — Telephone Encounter (Signed)
Relevant patient education mailed to patient.  

## 2013-11-08 NOTE — Assessment & Plan Note (Addendum)
Lung nodule lighted up on PET scan , probable cancer Referral to surgery If not a candidate , you will need biopsy & radiation - prefer CT guided to ENB given peripheral location, although would carry risk  I discussed with IR & they would be willing to perform CT guided bx - will await surgical opinion prior to scheduling

## 2013-11-08 NOTE — Patient Instructions (Signed)
Lung nodule lighted up on PET scan , probable cancer Referral to surgery If not a candidate , you will need biopsy & radiation

## 2013-11-08 NOTE — Progress Notes (Signed)
   Subjective:    Patient ID: [name], male    DOB: 25-Feb-1946, 68 y.o.   MRN: [id number]  HPI  68 year old male smoker with ischemic cardiomyopathy and chronic dyspnea presents for followup of COPD and pulmonary nodule. Admitted 08/2013 from cardiology office for chest pain & resting O2 sats of 79% and working Dx of CAP.  Echo showed EF 30-35% akinesis of the apex, apical anterior, septal, inferior walls and hypokinesis of the mid and basal inferior and inferolateral walls  Right ventricle: The cavity size was mildly dilated.Systolic function was moderately reduced. Right atrium: The atrium was moderately dilated.  Cath 08/16/13 PCWP 25, Mean RA 11 mmHg  RV 54/5 mmHg  PA: 56/29 mean 39 mmHg  CO: 3.39  He has a 40+ PY hx of smoking  No discernible benefit from Advair (has been on for several years)  He had Chronic appearing pleural disease on right on CT chest & LUL 1.5 cm oblong opacity worrisome for malignancy   PFTs 09/2013 - FEV1 2.18- 61%, ratio 64, FVC 3.40 -71%, DLCO 11.0- 32%  Desatn to 87% on RA, improved with 2L o2  Overall he is breathing better, goes without oxygen for short periods, no orhtopnea, PND, pedal edema is resolved PET - 1.6 x 1.1 cm macrolobulated pleural-based nodule with spiculated margins and hypermetabolism (SUVmax = 3.6)  Satn 95% on 2L , desat to 85% on walking on room air  Review of Systems neg for any significant sore throat, dysphagia, itching, sneezing, nasal congestion or excess/ purulent secretions, fever, chills, sweats, unintended wt loss, pleuritic or exertional cp, hempoptysis, orthopnea pnd or change in chronic leg swelling. Also denies presyncope, palpitations, heartburn, abdominal pain, nausea, vomiting, diarrhea or change in bowel or urinary habits, dysuria,hematuria, rash, arthralgias, visual complaints, headache, numbness weakness or ataxia.     Objective:   Physical Exam  Gen. Pleasant, well-nourished, in no distress ENT - no  lesions, no post nasal drip Neck: No JVD, no thyromegaly, no carotid bruits Lungs: no use of accessory muscles, no dullness to percussion, clear without rales or rhonchi  Cardiovascular: Rhythm regular, heart sounds  normal, no murmurs or gallops, no peripheral edema Musculoskeletal: No deformities, no cyanosis or clubbing        Assessment & Plan:

## 2013-11-08 NOTE — Patient Instructions (Signed)
Do warm soaks to help make sure all the pus is out of the wound.  Take the antibiotics until they are gone.  If you are not feeling better in 48h return for a recheck otherwise no follow-up is needed.

## 2013-11-08 NOTE — Progress Notes (Signed)
[facility] [address] [place] [address] [phone]  Date:  11/08/2013   Name:  [name]   DOB:  April 26, 1946   MRN:  [id number]  PCP:  Joycelyn Man, MD    Chief Complaint: abcess on bottom   History of Present Illness:  [name] is a 68 y.o. very pleasant male patient who presents with the following:  3 day history of painful swelling in left perineum just lateral to posterior scrotum.  No history of injury.  No fever or chills.  No nausea or vomiting.  No improvement with over the counter medications or other home remedies. Denies other complaint or health concern today.   Patient Active Problem List   Diagnosis Date Noted  . Diabetes 11/04/2013  . Myocardial infarction 11/01/2013  . Cardiomyopathy, nonischemic 11/01/2013  . Preop cardiovascular exam 11/01/2013  . Solitary pulmonary nodule 08/14/2013  . H/O intracranial hemorrhage 08/13/2013  . H/O endocarditis 08/13/2013  . H/O: CVA (cerebrovascular accident) 08/13/2013  . Chronic respiratory failure assoc with chf/ PAH 08/12/2013  . H/O atrioventricular nodal ablation   . Status post mitral valve replacement with bioprosthetic valve   . Chronic systolic CHF (congestive heart failure) 01/08/2012  . Hypertension   . Dyslipidemia   . Atrial fibrillation   . Ejection fraction < 50%   . Atrial septal defect   . Mitral regurgitation   . ICD (implantable cardioverter-defibrillator), biventricular, in situ   . Renal artery stenosis   . Pulmonary hypertension   . CARCINOMA, SKIN, SQUAMOUS CELL 04/20/2009  . HYPERGLYCEMIA, FASTING 04/01/2009  . COLONIC POLYPS, ADENOMATOUS 03/22/2007  . COPD GOLD II 01/11/2007  . GERD 01/11/2007    Past Medical History  Diagnosis Date  . CAD (coronary artery disease)     CABG was not done at that time mitral valve replacement done through lateral thoracotomy  . Hypertension   . Endocarditis     Bacterial, 2009  . Colon polyps   .  Dyslipidemia   . COPD (chronic obstructive pulmonary disease)   . Atrial fibrillation     AV Node ablation January, 2010, for rapid atrial fib  . Cardiomyopathy     Ischemic  . Ejection fraction < 50%     EF 30%, echo, December, 2010, apex dyskinetic, moderate hypokinesis posterior wall and inferior wall  . Atrial septal defect     Closed with surgery January, 2010  . Mitral regurgitation     Severe,Mitral valve replacement  . Intracranial hemorrhage     Coumadin cannot be used because of the history of his bleed  . Renal artery stenosis     Mild by history  . Pulmonary hypertension     Moderate  . HLD (hyperlipidemia)   . Status post mitral valve replacement with bioprosthetic valve   . Automatic implantable cardioverter-defibrillator in situ     LV dysfunction and pacer needed for AV node lesion  . CVA (cerebral vascular accident) 2009    denies residual on 08/14/2013  . CHF (congestive heart failure)   . Myocardial infarction 2010  . Dysrhythmia   . Pre-diabetes   . Pneumonia     "this is my first case" (08/14/2013)    Past Surgical History  Procedure Laterality Date  . Mitral valve replacement  07/17/2008    Tissue valve / Echo, December, 2010, valve working well  . Cardiac defibrillator placement  ~ 2010    [employer] Jude, Remote  . Penile prosthesis  implant    . Appendectomy    . Mass biopsy Left     neck mass  . Leep    . Thoracotomy Right     right, for mitral valve replacement  . Cataract extraction w/ intraocular lens  implant, bilateral Bilateral   . Cardiac valve replacement    . Av node ablation  07/2008    for rapid atrial fib    History  Substance Use Topics  . Smoking status: Former Smoker -- 1.00 packs/day for 45 years    Types: Cigarettes  . Smokeless tobacco: Never Used     Comment: 08/14/2013 "quit smoking in 2009"  . Alcohol Use: Yes     Comment: 08/14/2013 "used to drink beer; quit:in 1982"    Family History  Problem Relation Age of Onset  . Stomach  cancer Father   . Cancer Father   . Stroke      family history  . Stroke Mother     Allergies  Allergen Reactions  . Anticoagulant Compound     Pt had intracranial bleed, therefore all anticoagulation is contra-indicated per Dr. Ron Parker.  . Warfarin Sodium     Medication list has been reviewed and updated.  Current Outpatient Prescriptions on File Prior to Visit  Medication Sig Dispense Refill  . ACCU-CHEK SOFTCLIX LANCETS lancets Test once daily, dx 250.00  100 each  12  . acetaminophen (TYLENOL) 500 MG tablet Per bottle as needed for pain      . amoxicillin (AMOXIL) 500 MG capsule 4 capsules 1 hour prior to dental procedures      . aspirin EC 81 MG tablet Take 1 tablet (81 mg total) by mouth daily.  90 tablet  3  . atorvastatin (LIPITOR) 20 MG tablet Take 1 tablet (20 mg total) by mouth daily.  90 tablet  0  . glucose blood (ACCU-CHEK AVIVA PLUS) test strip Test once daily, dx 250.00  100 each  12  . irbesartan (AVAPRO) 75 MG tablet Take 1 tablet (75 mg total) by mouth daily.  90 tablet  3  . benzonatate (TESSALON) 100 MG capsule Take 1 capsule (100 mg total) by mouth 3 (three) times daily as needed for cough.  20 capsule  0  . carvedilol (COREG) 3.125 MG tablet TAKE 1 TABLET BY MOUTH TWICE A DAY WITH MEALS  180 tablet  0  . furosemide (LASIX) 40 MG tablet Take 40 mg by mouth daily. May take 1 extra daily as needed      . isosorbide dinitrate (ISORDIL) 10 MG tablet TAKE 1 TABLET BY MOUTH 3 TIMES A DAY  90 tablet  1  . metFORMIN (GLUCOPHAGE) 500 MG tablet One tablet prior to breakfast  100 tablet  3  . nitroGLYCERIN (NITROSTAT) 0.4 MG SL tablet Place 1 tablet (0.4 mg total) under the tongue every 5 (five) minutes as needed. For chest pain  25 tablet  1  . SYMBICORT 160-4.5 MCG/ACT inhaler INHALE 2 PUFFS INTO THE LUNGS TWICE A DAY  10.2 g  6   No current facility-administered medications on file prior to visit.    Review of Systems:  As per HPI, otherwise negative.    Physical  Examination: Filed Vitals:   11/08/13 1457  BP: 108/64  Pulse: 72  Temp: 98.2 F (36.8 C)  Resp: 16   Filed Vitals:   11/08/13 1457  Height: 6' 1.1" (1.857 m)  Weight: 208 lb 9.6 oz (94.62 kg)   Body mass index is 27.44 kg/(m^2).  Ideal Body Weight: Weight in (lb) to have BMI = 25: 189.6   GEN: WDWN, NAD, Non-toxic, Alert & Oriented x 3 HEENT: Atraumatic, Normocephalic.  Ears and Nose: No external deformity. EXTR: No clubbing/cyanosis/edema NEURO: Normal gait.  PSYCH: Normally interactive. Conversant. Not depressed or anxious appearing.  Calm demeanor.  Genitalia:  Normal male circumcised.  7 x 4 x 3 cm abscess Assessment and Plan: Perineal abscess I& D Septra Follow up   Signed,  Ellison Carwin, MD

## 2013-11-11 LAB — WOUND CULTURE: Gram Stain: NONE SEEN

## 2013-11-18 ENCOUNTER — Institutional Professional Consult (permissible substitution) (INDEPENDENT_AMBULATORY_CARE_PROVIDER_SITE_OTHER): Payer: Medicare Other | Admitting: Thoracic Surgery (Cardiothoracic Vascular Surgery)

## 2013-11-18 ENCOUNTER — Encounter: Payer: Self-pay | Admitting: Thoracic Surgery (Cardiothoracic Vascular Surgery)

## 2013-11-18 ENCOUNTER — Other Ambulatory Visit: Payer: Self-pay | Admitting: *Deleted

## 2013-11-18 VITALS — BP 78/51 | HR 84 | Resp 20 | Ht 74.0 in | Wt 208.0 lb

## 2013-11-18 DIAGNOSIS — R911 Solitary pulmonary nodule: Secondary | ICD-10-CM

## 2013-11-18 DIAGNOSIS — I5042 Chronic combined systolic (congestive) and diastolic (congestive) heart failure: Secondary | ICD-10-CM

## 2013-11-18 DIAGNOSIS — Z952 Presence of prosthetic heart valve: Secondary | ICD-10-CM | POA: Diagnosis not present

## 2013-11-18 DIAGNOSIS — I509 Heart failure, unspecified: Secondary | ICD-10-CM

## 2013-11-18 DIAGNOSIS — I4891 Unspecified atrial fibrillation: Secondary | ICD-10-CM

## 2013-11-18 DIAGNOSIS — I97190 Other postprocedural cardiac functional disturbances following cardiac surgery: Secondary | ICD-10-CM | POA: Diagnosis not present

## 2013-11-18 DIAGNOSIS — Z953 Presence of xenogenic heart valve: Secondary | ICD-10-CM

## 2013-11-18 DIAGNOSIS — T8209XA Other mechanical complication of heart valve prosthesis, initial encounter: Secondary | ICD-10-CM

## 2013-11-18 NOTE — H&P (Addendum)
[address]       [place] [address]             [phone]     CARDIOTHORACIC SURGERY CONSULTATION REPORT  Referring Provider is Rigoberto Noel, MD PCP is Joycelyn Man, MD  Chief Complaint  Patient presents with  . Lung Lesion    Surgical eval on  left upper lobe Pulmonary nodule, Chest CT 08/13/13, PET Scan 10/29/13    HPI:  Patient is a 68 year old white male with complex past medical history referred for possible surgical resection of lung nodule in the left upper lobe suspicious for possible primary lung cancer.  The patient has a long-standing history of tobacco abuse, COPD, hypertension, nonischemic cardiomyopathy, permanent atrial fibrillation, and chronic combined systolic and diastolic congestive heart failure.  In 2010 he underwent minimally invasive mitral valve replacement using a porcine bioprosthetic tissue valve and autologous pericardial patch closure of a secundum type atrial septal defect.  At the time he had bacterial endocarditis complicated by severe mitral regurgitation and congestive heart failure with history of previous embolic stroke.  He did not undergo Maze procedure at the time because he had previously been treated with AV node ablation.  His left atrial appendage was over-sewn at the time of surgery.  He recovered from his surgery uneventfully and has done well from a cardiac standpoint until recently.  In February of this year he presented with a brief episode of chest discomfort and shortness of breath associated with hypoxemia.  He was hospitalized and treated for possible pneumonia, COPD exacerbation and acute exacerbation of chronic combined systolic and diastolic congestive heart failure. He ruled out for acute myocardial infarction.  Left heart catheterization was not performed.  An echocardiogram was performed demonstrating stable chronic left ventricular dysfunction with ejection fraction estimated 30-35%. There was some  suggestion of slightly increased gradient across the patient's bioprosthetic tissue valve in the mitral position, although there was no significant mitral regurgitation nor any sign of perivalvular leak.  Right heart catheterization was performed, demonstrating significant pulmonary hypertension.  Transesophageal echocardiogram was suggested but not performed.  CT angiogram of the chest performed at the time of admission revealed a small spiculated nodule in the left upper lobe suspicious for primary lung cancer. The patient was seen in followup for this nodule recently by Dr. Elsworth Soho who subsequently obtained a PET-CT scan that confirmed the presence of the nodule seen on CT, which was noted to have increased metabolic activity (max SUV 3.6) further raising the suspicion of primary lung cancer. There was no sign of metastatic disease in the mediastinum or elsewhere in the body.  The patient was referred for surgical consultation.  The patient lives at home with his wife. He complains that he has been told not to perform any sort of strenuous activity since he left the hospital.  He claims that he feels well and that he thinks he could be doing more than he has been recently.  He does admit to some degree of exertional shortness of breath, although he states that this is really minimal. He denies any recent problems with exertional shortness of breath or chest discomfort since his most recent hospital discharge. He reports no PND, orthopnea, or lower extremity edema.  He has an intermittent dry cough. He denies any productive cough, hemoptysis, wheezing. He denies any dizzy spells, tachypalpitations, or syncope.  He was discharged from the hospital on home oxygen therapy, but according to the  patient and his wife the patient's oxygen saturations have been stable in the mid 90s off oxygen at rest, with a tendency to decrease to the 80s with exertion.  He states that he just quit smoking this past February.    Past  Medical History  Diagnosis Date  . Hypertension   . Endocarditis     Bacterial, 2009  . Colon polyps   . Dyslipidemia   . COPD (chronic obstructive pulmonary disease)   . Atrial fibrillation     AV Node ablation January, 2010, for rapid atrial fib  . Cardiomyopathy     non-ischemic  . Ejection fraction < 50%   . Atrial septal defect     Closed with surgery January, 2010  . Mitral regurgitation     Severe symptomatic primary MR due to bacterial endocarditis, treated w/ MVR  . Intracranial hemorrhage     Coumadin cannot be used because of the history of his bleed  . Renal artery stenosis     Mild by history  . Pulmonary hypertension     Moderate  . HLD (hyperlipidemia)   . Automatic implantable cardioverter-defibrillator in situ     LV dysfunction and pacer needed for AV node lesion  . CVA (cerebral vascular accident) 2009    denies residual on 08/14/2013  . CHF (congestive heart failure)   . Myocardial infarction 2010  . Dysrhythmia   . Pre-diabetes   . Pneumonia     "this is my first case" (08/14/2013)  . Spontaneous pneumothorax     right thoracotomy - distant past  . Permanent atrial fibrillation     Originally Coumadin use for atrial fibrillation  //   he had intracerebral hemorrhage with an INR of 2.3 June, 2009. Anticoagulation could no longer be used.  //  Rapid atrial fibrillation after inferior MI October, 2010..........Marland Kitchen AV node ablation done at that time with ICD pacemaker placed (EF 35%).   //   Left atrial appendage tied off at the time of mitral valve surgery January, 2010 (maze pro  . Status post minimally invasive mitral valve replacement with bioprosthetic valve     33 mm Medtronic Mosaic porcine bioprosthesis placed via right mini thoracotomy for bacterial endocarditis complicated by severe MR and CHF   . Prosthetic valve dysfunction     Mild mitral stenosis  . Chronic combined systolic and diastolic CHF (congestive heart failure)   . COPD GOLD II 01/11/2007     PFT's 09/25/13  FEV1  2.18 (61%) ratio 64 no change p B2 and DLC0  32% corrects to 68% - trial off advair and acei rec starting  08/23/2013       Past Surgical History  Procedure Laterality Date  . Mitral valve replacement Right 07/17/2008    71mm Medtronic Mosaic porcine bioprosthesis  . Cardiac defibrillator placement  ~ 2010    St Jude  . Penile prosthesis implant    . Appendectomy    . Mass biopsy Left     neck mass  . Leep    . Cataract extraction w/ intraocular lens  implant, bilateral Bilateral   . Asd repair, secundum  07/17/2008    pericardial patch closure of ASD  . Av node ablation  07/2008    for rapid atrial fib  . 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 Stomach cancer Father   . Cancer Father   .  Stroke      family history  . Stroke Mother     History   Social History  . Marital Status: Married    Spouse Name: [name]    Number of Children: 0  . Years of Education: College   Occupational History  . Retired     Tour manager   Social History Main Topics  . Smoking status: Former Smoker -- 1.00 packs/day for 45 years    Types: Cigarettes  . Smokeless tobacco: Never Used     Comment: 08/14/2013 "quit smoking in 2009"  . Alcohol Use: Yes     Comment: 08/14/2013 "used to drink beer; quit:in 1982"  . Drug Use: No  . Sexual Activity: Yes   Other Topics Concern  . Not on file   Social History Narrative   Patient lives at home with his spouse.   Caffeine Use: none    Current Outpatient Prescriptions  Medication Sig Dispense Refill  . ACCU-CHEK SOFTCLIX LANCETS lancets Test once daily, dx 250.00  100 each  12  . acetaminophen (TYLENOL) 500 MG tablet Per bottle as needed for pain      . acetaminophen-codeine (TYLENOL #3) 300-30 MG per tablet Take 1-2 tablets by mouth every 4 (four) hours as needed.  30 tablet  0  . amoxicillin (AMOXIL) 500 MG capsule 4 capsules 1 hour  prior to dental procedures      . aspirin EC 81 MG tablet Take 1 tablet (81 mg total) by mouth daily.  90 tablet  3  . atorvastatin (LIPITOR) 20 MG tablet Take 1 tablet (20 mg total) by mouth daily.  90 tablet  0  . benzonatate (TESSALON) 100 MG capsule Take 1 capsule (100 mg total) by mouth 3 (three) times daily as needed for cough.  20 capsule  0  . carvedilol (COREG) 3.125 MG tablet TAKE 1 TABLET BY MOUTH TWICE A DAY WITH MEALS  180 tablet  0  . furosemide (LASIX) 40 MG tablet Take 40 mg by mouth daily. May take 1 extra daily as needed      . glucose blood (ACCU-CHEK AVIVA PLUS) test strip Test once daily, dx 250.00  100 each  12  . irbesartan (AVAPRO) 75 MG tablet Take 1 tablet (75 mg total) by mouth daily.  90 tablet  3  . isosorbide dinitrate (ISORDIL) 10 MG tablet TAKE 1 TABLET BY MOUTH 3 TIMES A DAY  90 tablet  1  . metFORMIN (GLUCOPHAGE) 500 MG tablet One tablet prior to breakfast  100 tablet  3  . nitroGLYCERIN (NITROSTAT) 0.4 MG SL tablet Place 1 tablet (0.4 mg total) under the tongue every 5 (five) minutes as needed. For chest pain  25 tablet  1  . sulfamethoxazole-trimethoprim (BACTRIM DS) 800-160 MG per tablet Take 1 tablet by mouth 2 (two) times daily.  20 tablet  0  . SYMBICORT 160-4.5 MCG/ACT inhaler INHALE 2 PUFFS INTO THE LUNGS TWICE A DAY  10.2 g  6   No current facility-administered medications for this visit.    Allergies  Allergen Reactions  . Anticoagulant Compound     Pt had intracranial bleed, therefore all anticoagulation is contra-indicated per Dr. Ron Parker.  . Warfarin Sodium       Review of Systems:   General:  normal appetite, normal energy, no weight gain, no weight loss, no fever  Cardiac:  no chest pain with exertion, no chest pain at rest, perhaps mild SOB with exertion, no resting SOB, no PND, no  orthopnea, no palpitations, + arrhythmia, + atrial fibrillation, no LE edema, no dizzy spells, no syncope  Respiratory:  + mild shortness of breath, + home  oxygen, no productive cough, + intermittent dry cough, no bronchitis, no wheezing, no hemoptysis, no asthma, no pain with inspiration or cough, no sleep apnea, no CPAP at night  GI:   no difficulty swallowing, no reflux, no frequent heartburn, no hiatal hernia, no abdominal pain, no constipation, no diarrhea, no hematochezia, no hematemesis, no melena  GU:   no dysuria,  no frequency, no urinary tract infection, no hematuria, no enlarged prostate, no kidney stones, no kidney disease  Vascular:  no pain suggestive of claudication, no pain in feet, no leg cramps, no varicose veins, no DVT, no non-healing foot ulcer  Neuro:   + stroke, no TIA's, no seizures, no headaches, no temporary blindness one eye,  no slurred speech, no peripheral neuropathy, no chronic pain, no instability of gait, no memory/cognitive dysfunction  Musculoskeletal: no arthritis, no joint swelling, no myalgias, no difficulty walking, normal mobility   Skin:   no rash, no itching, no skin infections, no pressure sores or ulcerations  Psych:   no anxiety, no depression, no nervousness, no unusual recent stress  Eyes:   no blurry vision, no floaters, no recent vision changes, + wears glasses or contacts  ENT:   + hearing loss, no loose or painful teeth, no dentures, last saw dentist December 2014 - gets antibiotic prophylaxis  Hematologic:  no easy bruising, no abnormal bleeding, no clotting disorder, no frequent epistaxis  Endocrine:  + diabetes, checks CBG's at home     Physical Exam:   BP 78/51  Pulse 84  Resp 20  Ht 6\' 2"  (1.88 m)  Wt 208 lb (94.348 kg)  BMI 26.69 kg/m2  SpO2 94%  General:    well-appearing  HEENT:  Unremarkable   Neck:   no JVD, no bruits, no adenopathy   Chest:   clear to auscultation, symmetrical breath sounds, no wheezes, no rhonchi   CV:   RRR, no murmur   Abdomen:  soft, non-tender, no masses   Extremities:  warm, well-perfused, pulses palpable, no LE edema, + chronic varicose  veins  Rectal/GU  Deferred  Neuro:   Grossly non-focal and symmetrical throughout  Skin:   Clean and dry, no rashes, no breakdown   Diagnostic Tests:  Transthoracic Echocardiography  Patient: [name], [name] MR #: [id number] Study Date: 08/13/2013 Gender: M Age: 72 Height: 188cm Weight: 96.6kg BSA: 2.9m^2 Pt. Status: Room: 2W27C  ADMITTING Dola Argyle Celene Squibb ATTENDING Eleonore Chiquito SONOGRAPHER Donata Clay PERFORMING [facility], Inpatient cc:  ------------------------------------------------------------ LV EF: 30% - 35%  ------------------------------------------------------------ Indications: Shortness of breath 786.05.  ------------------------------------------------------------ History: PMH: Atrial Septal Defect. Hypoxia. Pulmonary Hypertension. AV Nodal Ablasion. Endocarditis.Ischemic Cardiomyopathy Atrial fibrillation. Coronary artery disease. Congestive heart failure. Chronic obstructive pulmonary disease.  ------------------------------------------------------------ Study Conclusions  - Left ventricle: There is akinesis of the apex, apical anterior, septal, inferior walls and hypokinesis of the mid and basal inferior and inferolateral walls. The cavity size was mildly dilated. There was mild concentric hypertrophy. Systolic function was moderately to severely reduced. The estimated ejection fraction was in the range of 30% to 35%. - Aortic root: Mildly dilated aortic root measuring 44 mm. - Ascending aorta: The ascending aorta was normal in size. - Mitral valve: There is a bioprosthetic mitral valve, that is well seated. There is no mitral regurgitation and no paravalvular leak. Transmitral gradients areelevated when compared  to the prior study fromDember 17,2014(average mean gradient 74mmHg, previosuly6 mmHg). Mobility cannot be assessed on current study.A TEE or fluoroscopy should be considered to rule out leaflet thrombosis. Valve area  by continuity equation (using LVOT flow): 0.87cm^2. - Left atrium: The atrium was severely dilated. - Right ventricle: The cavity size was mildly dilated. Systolic function was moderately reduced. - Right atrium: The atrium was moderately dilated. - Atrial septum: No defect or patent foramen ovale was identified. - Impressions: When compared to the prior study from 06/26/2013 the LV cavity is now mildly dilated. Transmitral gradient across bioprosthetic valve are elevated when compared to the prior study. Leaflet motion is not well visualized. If clinically indicated afluoroscopy or TEE is recommended. Impressions:  - When compared to the prior study from 06/26/2013 the LV cavity is now mildly dilated. Transmitral gradient across bioprosthetic valve are elevated when compared to the prior study. Leaflet motion is not well visualized. If clinically indicated afluoroscopy or TEE is recommended.  ------------------------------------------------------------ Labs, prior tests, procedures, and surgery: Permanent pacemaker system implantation.  Echocardiography. M-mode, complete 2D, spectral Doppler, and color Doppler. Height: Height: 188cm. Height: 74in. Weight: Weight: 96.6kg. Weight: 212.6lb. Body mass index: BMI: 27.3kg/m^2. Body surface area: BSA: 2.62m^2. Blood pressure: 111/72. Patient status: Inpatient. Location: Bedside.  ------------------------------------------------------------  ------------------------------------------------------------ Left ventricle: There is akinesis of the apex, apical anterior, septal, inferior walls and hypokinesis of the mid and basal inferior and inferolateral walls. The cavity size was mildly dilated. There was mild concentric hypertrophy. Systolic function was moderately to severely reduced. The estimated ejection fraction was in the range of 30% to 35%.  ------------------------------------------------------------ Aortic valve:  Structurally normal valve. Cusp separation was normal. Doppler: Transvalvular velocity was within the normal range. There was no stenosis. No regurgitation.  ------------------------------------------------------------ Aorta: Aortic root: Mildly dilated aortic root measuring 44 mm. Ascending aorta: The ascending aorta was normal in size.  ------------------------------------------------------------ Mitral valve: There is a bioprosthetic mitral valve, that is well seated. There is no mitral regurgitation and no paravalvular leak. Transmitral gradients areelevated when compared to the prior study fromDember 17,2014(average mean gradient 42mmHg, previosuly6 mmHg). Mobility cannot be assessed on current study.A TEE or fluoroscopy should be considered to rule out leaflet thrombosis. Doppler: Valve area by continuity equation (using LVOT flow): 0.87cm^2. Indexed valve area by continuity equation (using LVOT flow): 0.39cm^2/m^2. Mean gradient: 81mm Hg (D). Peak gradient: 97mm Hg (D).  ------------------------------------------------------------ Left atrium: The atrium was severely dilated.  ------------------------------------------------------------ Atrial septum: No defect or patent foramen ovale was identified.  ------------------------------------------------------------ Right ventricle: The cavity size was mildly dilated. Pacer wire or catheter noted in right ventricle. Systolic function was moderately reduced.  ------------------------------------------------------------ Right atrium: The atrium was moderately dilated.  ------------------------------------------------------------ Pericardium: There is mild posteriorly located pericardial effusin without evidence of hemodynamic compromise.  ------------------------------------------------------------  2D measurements Normal Doppler measurements Normal Left ventricle LVOT LVID ED, 55.9 mm 43-52 Peak vel, 55.5 cm/s  ------ chord, S PLAX VTI, S 8.63 cm ------ LVID ES, 46.6 mm 23-38 Mitral valve chord, Mean vel, 120 cm/s ------ PLAX D FS, chord, 17 % >29 Mean 8 mm Hg ------ PLAX gradient, LVPW, ED 11.4 mm ------ D IVS/LVPW 0.71 <1.3 Peak 9 mm Hg ------ ratio, ED gradient, Vol ED, 164 ml ------ D MOD1 Area 0.87 cm^2 ------ Vol ES, 116 ml ------ (LVOT) MOD1 continuit EF, MOD1 29 % ------ y Vol index, 74 ml/m^2 ------ Area 0.39 cm^2/m^ ------ ED, MOD1 index 2 Vol index, 52 ml/m^2 ------ (LVOT ES, MOD1  cont) Vol ED, 153 ml ------ Annulus 57.1 cm ------ MOD2 VTI Vol ES, 96 ml ------ Systemic veins MOD2 Estimated 3 mm Hg ------ EF, MOD2 37 % ------ CVP Stroke 57 ml ------ vol, MOD2 Vol index, 69 ml/m^2 ------ ED, MOD2 Vol index, 43 ml/m^2 ------ ES, MOD2 Stroke 25.6 ml/m^2 ------ index, MOD2 Ventricular septum IVS, ED 8.14 mm ------ LVOT Diam, S 27 mm ------ Area 5.73 cm^2 ------ Left atrium AP dim 65 mm ------ AP dim 2.91 cm/m^2 <2.2 index  ------------------------------------------------------------ Prepared and Electronically Authenticated by  Ena Dawley, M.D. 2015-02-03T19:[id number]    CT CHEST WITH CONTRAST   TECHNIQUE: Multidetector CT imaging of the chest was performed during intravenous contrast administration.   CONTRAST:  34mL OMNIPAQUE IOHEXOL 300 MG/ML  SOLN   COMPARISON:  DG CHEST 2 VIEW dated 08/12/2013; DG CHEST 2 VIEW dated 02/06/2012; CT ABD W/CM dated 06/07/2008   No prior chest CTs available.   FINDINGS: Right subclavian pacemaker leads extend into the right ventricle and coronary sinus. There is moderate cardiomegaly with asymmetric enlargement of the right cardiac chambers and the left atrium. There is central enlargement of the pulmonary arteries. No acute pulmonary emboli are demonstrated. There is relatively mild atherosclerosis of the aorta, great vessels and coronary arteries.   There is a partially loculated pleural effusion on the  right which extends into the fissures. There is a small dependent pleural effusion on the left. There are pleural calcifications bilaterally without demonstrated pleural-based mass. There is no significant pericardial effusion.   Numerous small mediastinal and hilar lymph nodes are not pathologically enlarged. There is no axillary adenopathy or chest wall abnormality. Asymmetric gynecomastia is noted on the left.   Advanced emphysematous changes are present in both lungs with biapical bulla. There is atelectasis at both lung bases adjacent to the pleural effusions. In the left upper lobe, there is a somewhat spiculated nodule measuring up to 1.5 cm transverse on image 16. No other focal nodules are identified. There is no endobronchial lesion.   The visualized upper abdomen appears stable without suspicious findings. There are no worrisome osseous findings.   IMPRESSION: 1. Spiculated left upper lobe nodule is morphologically concerning for possible bronchogenic carcinoma. Given the patient's multiple other medical problems, consider chest CT follow-up in 4- 6 months to assess stability. PET-CT could be performed if clinically warranted. 2. Cardiomegaly with central enlargement of the pulmonary arteries consistent with pulmonary arterial hypertension. 3. Bilateral pleural effusions, partially loculated on the right. There is mild adjacent atelectasis, but no focal airspace disease. There underlying pleural calcifications bilaterally, but no evidence of pleural-based mass. 4. Emphysema and atherosclerosis noted.     Electronically Signed   By: Camie Patience M.D.   On: 08/13/2013 14:53     NUCLEAR MEDICINE PET SKULL BASE TO THIGH   TECHNIQUE: 10.3 mCi F-18 FDG was injected intravenously. Full-ring PET imaging was performed from the skull base to thigh after the radiotracer. CT data was obtained and used for attenuation correction and anatomic localization.   FASTING  BLOOD GLUCOSE:  Value: 161 mg/dl   COMPARISON:  Chest CT 08/13/2013.   FINDINGS: NECK   No hypermetabolic lymph nodes in the neck.   CHEST   Image 20 of series 6 demonstrates a 1.6 x 1.1 cm macrolobulated lesion with slightly spiculated margins in the apex of the left upper lobe, abutting the pleura, which is mildly hypermetabolic (SUVmax = 3.6). No other definite suspicious appearing pulmonary nodules or masses are identified. There  is a background of mild to moderate centrilobular and paraseptal emphysema, most pronounced throughout the lung apices bilaterally. Compared to the prior examination, the previously noted bilateral pleural effusions have resolved. Multifocal areas of mild pleural thickening are again noted in the thorax bilaterally, some of which appear partially calcified, without a frank plaque-like appearance. Possible suture line in the periphery of the right middle lobe, suggesting prior wedge resection. No hypermetabolic mediastinal or hilar nodes. Heart size is markedly enlarged with biatrial dilatation. Curvilinear hypoattenuation throughout the apex of the left ventricle, presumably fibrofatty remodeling related to prior apical infarction. There is no significant pericardial fluid, thickening or pericardial calcification. There is atherosclerosis of the thoracic aorta, the great vessels of the mediastinum and the coronary arteries, including calcified atherosclerotic plaque in the left anterior descending, left circumflex and right coronary arteries. Right-sided biventricular pacemaker/AICD with lead tips terminating in the right ventricular apex and overlying the lateral wall of the left ventricle via the coronary sinus and coronary veins.   ABDOMEN/PELVIS   No abnormal hypermetabolic activity within the liver, pancreas, adrenal glands, or spleen. No hypermetabolic lymph nodes in the abdomen or pelvis. Low-attenuation lesions in the kidneys bilaterally  are incompletely characterized on today's non contrast CT examination, but favored to represent cysts, largest of which measures up to 3.5 cm in diameter in the posterior aspect of the interpolar region of the right kidney. Atherosclerosis throughout the abdominal and pelvic vasculature, without evidence of aneurysm. No significant volume of ascites. No pneumoperitoneum. No pathologic distention of small bowel. Penile prosthesis incidentally noted.   SKELETON   No focal hypermetabolic activity to suggest skeletal metastasis.   IMPRESSION: 1. 1.6 x 1.1 cm macrolobulated pleural-based nodule with spiculated margins and hypermetabolism (SUVmax = 3.6) in the apex of the left upper lobe, concerning for primary bronchogenic neoplasm. No associated mediastinal or hilar lymphadenopathy, and no definite signs of metastatic disease on today's examination. These findings suggest T1a, N0, M0 disease (i.e., likely stage IA disease). Correlation with biopsy is suggested to establish a tissue diagnosis. 2. Resolution of previously noted bilateral pleural effusions. 3. Mild to moderate paraseptal and centrilobular emphysema most pronounced in the lung apices. 4. Cardiomegaly with biatrial dilatation. 5. Atherosclerosis, including 3 vessel coronary artery disease. Please note that although the presence of coronary artery calcium documents the presence of coronary artery disease, the severity of this disease and any potential stenosis cannot be assessed on this non-gated CT examination. Assessment for potential risk factor modification, dietary therapy or pharmacologic therapy may be warranted, if clinically indicated. 6. Additional incidental findings, as above.     Electronically Signed   By: Vinnie Langton M.D.   On: 10/29/2013 10:58      Pulmonary Function Tests  Baseline      Post-bronchodilator  FVC  3.40 L  (71% predicted) FVC  3.65 L  (77% predicted) FEV1  2.18 L  (61%  predicted) FEV1  2.37 L  (67% predicted) FEF25-75 1.17 L  (42% predicted) FEF25-75 1.50 L  (54% predicted)  TLC  5.61 L  (77% predicted) RV  2.03 L  (83% predicted) DLCO  32% predicted     Impression:  The patient has a small nodule located peripherally in the left upper lobe that is hypermetabolic on PET imaging and highly suspicious for primary lung cancer, clinical stage I (T1-2,N0,M0).  He has underlying COPD, chronic combined systolic and diastolic congestive heart failure, nonischemic cardiomyopathy, permanent atrial fibrillation, moderate pulmonary hypertension, and possible mild-moderate mitral stenosis  related to prosthetic valve dysfunction.  He is pacemaker-dependent, having undergone AV node ablation for treatment of atrial fibrillation in 2009.  He has been very stable from a cardiac standpoint until this past February when he was hospitalized for a brief episode of chest discomfort and shortness of breath associated with hypoxemia.  He ruled out for acute myocardial infarction at that time.  He has numerous risk factors for the presence of ischemic heart disease, but cardiac catheterization performed in 2009 was notable for the absence of any significant coronary artery disease.  He has had 2 previous thoracotomies in the past, including a large right posterolateral thoracotomy more than 30 years ago for spontaneous pneumothorax and more recent right miniature anterior thoracotomy for mitral valve replacement and closure of atrial septal defect in 2010.  Fortunately, the suspicious lung lesion is on the left side.  Spirometry demonstrates the presence of moderate obstructive disease, but his diffusion capacity is severely reduced.  The patient reportedly quit smoking 3 months ago.  Risks associated with surgery will unquestionably elevated but are likely not neccessarily prohibitive.  SBRT could be considered as an alternative to high risk surgical resection if tissue diagnosis of  malignancy could be ascertained using TTNA or ENB for biopsy.    Plan:  I discussed matters at length with the patient and his wife in the office today. We discussed alternative treatment strategies including either proceeding directly to surgical resection or continued close followup with or without an attempt at biopsy for tissue diagnosis.  We also discussed the possibility of consultation with one of my partners who is more actively enrolled in the thoracic oncology program prior to making a definitive decision. We will obtain a cardiopulmonary stress test to better assess his respiratory capacity given his history of hypoxemia and reduced diffusion capacity.  We will also obtain CT scan of the brain to rule out metastatic disease.  The patient will return once these tests have been done to discuss matters further.  All of his questions been addressed.   I spent in excess of 90 minutes during the conduct of this office consultation and >50% of this time involved direct face-to-face encounter with the patient for counseling and/or coordination of their care.   Valentina Gu. Roxy Manns, MD 11/18/2013 12:04 PM

## 2013-11-19 ENCOUNTER — Other Ambulatory Visit: Payer: Self-pay | Admitting: *Deleted

## 2013-11-19 DIAGNOSIS — J449 Chronic obstructive pulmonary disease, unspecified: Secondary | ICD-10-CM

## 2013-11-19 DIAGNOSIS — J9610 Chronic respiratory failure, unspecified whether with hypoxia or hypercapnia: Secondary | ICD-10-CM

## 2013-11-19 MED ORDER — BD LANCET ULTRAFINE 30G MISC: 1.0000 | Freq: Every day | Status: DC

## 2013-11-20 ENCOUNTER — Ambulatory Visit (HOSPITAL_COMMUNITY): Payer: Medicare Other | Attending: Thoracic Surgery (Cardiothoracic Vascular Surgery)

## 2013-11-20 DIAGNOSIS — J4489 Other specified chronic obstructive pulmonary disease: Secondary | ICD-10-CM | POA: Insufficient documentation

## 2013-11-20 DIAGNOSIS — R0602 Shortness of breath: Secondary | ICD-10-CM | POA: Diagnosis not present

## 2013-11-20 DIAGNOSIS — J9610 Chronic respiratory failure, unspecified whether with hypoxia or hypercapnia: Secondary | ICD-10-CM | POA: Insufficient documentation

## 2013-11-20 DIAGNOSIS — J449 Chronic obstructive pulmonary disease, unspecified: Secondary | ICD-10-CM

## 2013-11-21 ENCOUNTER — Other Ambulatory Visit: Payer: Self-pay | Admitting: Family Medicine

## 2013-11-21 ENCOUNTER — Ambulatory Visit (INDEPENDENT_AMBULATORY_CARE_PROVIDER_SITE_OTHER): Payer: Medicare Other | Admitting: *Deleted

## 2013-11-21 DIAGNOSIS — I428 Other cardiomyopathies: Secondary | ICD-10-CM | POA: Diagnosis not present

## 2013-11-21 DIAGNOSIS — I5022 Chronic systolic (congestive) heart failure: Secondary | ICD-10-CM

## 2013-11-21 DIAGNOSIS — I4891 Unspecified atrial fibrillation: Secondary | ICD-10-CM

## 2013-11-21 DIAGNOSIS — R911 Solitary pulmonary nodule: Secondary | ICD-10-CM

## 2013-11-21 DIAGNOSIS — I5042 Chronic combined systolic (congestive) and diastolic (congestive) heart failure: Secondary | ICD-10-CM

## 2013-11-21 DIAGNOSIS — E119 Type 2 diabetes mellitus without complications: Secondary | ICD-10-CM

## 2013-11-21 DIAGNOSIS — I509 Heart failure, unspecified: Secondary | ICD-10-CM

## 2013-11-21 LAB — MDC_IDC_ENUM_SESS_TYPE_REMOTE
HIGH POWER IMPEDANCE MEASURED VALUE: 78 Ohm
Implantable Pulse Generator Model: 3257
Implantable Pulse Generator Serial Number: 7053988
Lead Channel Pacing Threshold Amplitude: 0.625 V
Lead Channel Pacing Threshold Amplitude: 1 V
Lead Channel Pacing Threshold Pulse Width: 0.5 ms
Lead Channel Setting Pacing Amplitude: 2 V
Lead Channel Setting Pacing Pulse Width: 0.5 ms
MDC IDC MSMT LEADCHNL LV IMPEDANCE VALUE: 1125 Ohm
MDC IDC MSMT LEADCHNL LV PACING THRESHOLD PULSEWIDTH: 0.5 ms
MDC IDC MSMT LEADCHNL RV IMPEDANCE VALUE: 490 Ohm
MDC IDC SET LEADCHNL LV PACING AMPLITUDE: 2 V
MDC IDC SET LEADCHNL LV PACING PULSEWIDTH: 0.5 ms
MDC IDC SET LEADCHNL RV SENSING SENSITIVITY: 0.5 mV
MDC IDC SET ZONE DETECTION INTERVAL: 270 ms
MDC IDC SET ZONE DETECTION INTERVAL: 300 ms
MDC IDC STAT BRADY RV PERCENT PACED: 99 % — AB
Zone Setting Detection Interval: 370 ms

## 2013-11-21 NOTE — Progress Notes (Signed)
Remote ICD transmission.   

## 2013-11-22 ENCOUNTER — Ambulatory Visit
Admission: RE | Admit: 2013-11-22 | Discharge: 2013-11-22 | Disposition: A | Discharge status: Home / Self Care | Payer: Medicare Other | Source: Ambulatory Visit | Attending: Thoracic Surgery (Cardiothoracic Vascular Surgery) | Admitting: Thoracic Surgery (Cardiothoracic Vascular Surgery)

## 2013-11-22 DIAGNOSIS — C3490 Malignant neoplasm of unspecified part of unspecified bronchus or lung: Secondary | ICD-10-CM | POA: Diagnosis not present

## 2013-11-22 DIAGNOSIS — R911 Solitary pulmonary nodule: Secondary | ICD-10-CM

## 2013-11-22 MED ORDER — IOHEXOL 300 MG/ML  SOLN
75.0000 mL | Freq: Once | INTRAMUSCULAR | Status: AC | PRN
  Administered 2013-11-22: 75 mL via INTRAVENOUS

## 2013-11-25 ENCOUNTER — Ambulatory Visit (INDEPENDENT_AMBULATORY_CARE_PROVIDER_SITE_OTHER): Payer: Medicare Other | Admitting: Thoracic Surgery (Cardiothoracic Vascular Surgery)

## 2013-11-25 ENCOUNTER — Other Ambulatory Visit: Payer: Self-pay

## 2013-11-25 ENCOUNTER — Encounter: Payer: Self-pay | Admitting: Thoracic Surgery (Cardiothoracic Vascular Surgery)

## 2013-11-25 VITALS — BP 87/51 | HR 83 | Resp 18 | Ht 74.0 in | Wt 208.0 lb

## 2013-11-25 DIAGNOSIS — R911 Solitary pulmonary nodule: Secondary | ICD-10-CM

## 2013-11-25 DIAGNOSIS — J449 Chronic obstructive pulmonary disease, unspecified: Secondary | ICD-10-CM

## 2013-11-25 DIAGNOSIS — R918 Other nonspecific abnormal finding of lung field: Secondary | ICD-10-CM

## 2013-11-25 NOTE — Progress Notes (Signed)
[address]       [place] [address]             [phone]     CARDIOTHORACIC SURGERY OFFICE NOTE  Referring Provider is Rigoberto Noel, MD PCP is Joycelyn Man, MD   HPI:  Patient returns for followup of recently discovered left lung nodule suspicious for possible primary lung cancer. He was originally seen in consultation on 11/18/2013. Past week the patient underwent formal cardioplasty stress testing and brain CT scan. He returns to the office today to review the results of the test and discuss treatment options further. He reports no new problems or complaints over the past week.   Current Outpatient Prescriptions  Medication Sig Dispense Refill  . ACCU-CHEK SOFTCLIX LANCETS lancets Test once daily, dx 250.00  100 each  12  . acetaminophen (TYLENOL) 500 MG tablet Per bottle as needed for pain      . acetaminophen-codeine (TYLENOL #3) 300-30 MG per tablet Take 1-2 tablets by mouth every 4 (four) hours as needed.  30 tablet  0  . amoxicillin (AMOXIL) 500 MG capsule 4 capsules 1 hour prior to dental procedures      . aspirin EC 81 MG tablet Take 1 tablet (81 mg total) by mouth daily.  90 tablet  3  . atorvastatin (LIPITOR) 20 MG tablet Take 1 tablet (20 mg total) by mouth daily.  90 tablet  0  . benzonatate (TESSALON) 100 MG capsule Take 1 capsule (100 mg total) by mouth 3 (three) times daily as needed for cough.  20 capsule  0  . carvedilol (COREG) 3.125 MG tablet TAKE 1 TABLET BY MOUTH TWICE A DAY WITH MEALS  180 tablet  0  . furosemide (LASIX) 40 MG tablet Take 40 mg by mouth daily. May take 1 extra daily as needed      . glucose blood (ACCU-CHEK AVIVA PLUS) test strip Test once daily, dx 250.00  100 each  12  . irbesartan (AVAPRO) 75 MG tablet Take 1 tablet (75 mg total) by mouth daily.  90 tablet  3  . isosorbide dinitrate (ISORDIL) 10 MG tablet TAKE 1 TABLET BY MOUTH 3 TIMES A DAY  90 tablet  1  . Lancets (BD LANCET ULTRAFINE 30G) MISC 1 each by  Does not apply route daily.  100 each  3  . metFORMIN (GLUCOPHAGE) 500 MG tablet One tablet prior to breakfast  100 tablet  3  . nitroGLYCERIN (NITROSTAT) 0.4 MG SL tablet Place 1 tablet (0.4 mg total) under the tongue every 5 (five) minutes as needed. For chest pain  25 tablet  1  . SYMBICORT 160-4.5 MCG/ACT inhaler INHALE 2 PUFFS INTO THE LUNGS TWICE A DAY  10.2 g  6   No current facility-administered medications for this visit.      Physical Exam:   BP 87/51  Pulse 83  Resp 18  Ht 6\' 2"  (1.88 m)  Wt 208 lb (94.348 kg)  BMI 26.69 kg/m2  SpO2 92%  General:  Well appearing  Chest:   clear  CV:   RRR w/out murmur  Incisions:  n/a  Abdomen:  soft  Extremities:  warm  Diagnostic Tests:  CARDIOPULMONARY EXERCISE TEST  Referred for: Chronic respiratory failure/pre-operative evaluation  Procedure: This patient underwent staged symptom-limited exercise treadmill testing using a modified Naughton protocol with expired gas analysis metabolic evaluation during exercise.  Demographics  Age: 68 Ht. (in.) 61 Wt. (lb) 203 BMI: 26.1  Predicted Peak VO2: 29 ml/kg/min  Gender: Male Ht (cm) 188 Wt. (kg) 92.1    Results  Pre-Exercise PFTs   FVC 3.70 (70%)   FEV1 2.28 (58%)   FEV1/FVC 62%   MVV 105 (71%)  Exercise Time: 6:00 Speed (mph): 2.0 Grade (%): 3.5    RPE: 13  Reason stopped: Patient stopped due to leg fatigue (8/10)  Additional symptoms: None reported  Resting HR: 83 Peak HR: 98 (64% age predicted max HR)  BP rest: 100/58 BP peak: 106/60  Peak VO2: 12.7 (43.7% predicted peak VO2)  VE/VCO2 slope: 40.5  OUES: 1.61  Peak RER: 0.95  Ventilatory Threshold: Could not be determined  Peak RR 31  Peak Ventilation: 49.2  VE/MVV: 46.7%  PETCO2 at peak: 29  O2pulse: 12 (71% predicted O2pulse)   Interpretation  Notes: Patient gave a good effort. Pulse-oximetry was 96% at rest and dropped steadily throughout the exercise to 90% at  peak exercise.  ECG: Resting ECG with complete heart block and RV pacing. There was an inadequate HR response to the exercise with continued complete heart block and RV pacing throughout the exercise. The BP response was relatively unchanging throughout the exercise.   PFT: Resting spirometry demonstrates a moderate obstructive pattern. The MVV was mildly reduced.  CPX: Evaluation of this exercise test with gas exchange is limited due to the sub-maximal aerobic effort demonstrated by the RER of 0.95. The peak VO2 was measured to be 12.7 ml/kg/min (44% of the age/gender/weight matched sedentary norms). The VE/VCO2 slope is significantly elevated indicating increased dead space ventilation and poor ventilatory efficiency. The oxygen uptake efficiency slope (OUES) is moderately reduced and suggestive of a higher potential functional capacity than was measured (supports sub-maximal exercise). The VO2 at the ventilatory threshold could not be determined. At peak exercise, the ventilation reached 47% of the measured MVV indicating ventilatory reserve remained (respiratory rate was below the expected range, Vt/IC [55%] was below the expected range, PETCO2 was below normal). The O2pulse (a surrogate for stroke volume) increased slowly throughout the exercise.   Conclusion: Interpretation of the exercise test with gas exchange is limited due to the submaximal aerobic effort. Based on available data, there is a moderate-to-severe functional limitation due primarily to a circulatory limitation. There is significant chronotropic incompetence. Given the patient's test results he is felt to be at moderate to high risk for peri-operative cardiac complications.   Daniel R Bensimhon,MD 12:14 AM    CT HEAD WITHOUT AND WITH CONTRAST  TECHNIQUE:  Contiguous axial images were obtained from the base of the skull  through the vertex without and with intravenous contrast  CONTRAST: 27mL OMNIPAQUE  IOHEXOL 300 MG/ML SOLN  COMPARISON: 06/07/2010  FINDINGS:  No evidence for acute infarction, hemorrhage, mass lesion,  hydrocephalus, or extra-axial fluid. Mild atrophy. Mild small vessel  disease. Bilateral chronic cerebral infarcts affect the left  posterior temporal and right parietal regions, stable from 2011. No  vasogenic edema. No abnormal postcontrast enhancement. No proximal  vascular occlusion. Prominent cortical veins. Negative orbits. No  acute sinus or mastoid fluid.  IMPRESSION:  No intracranial metastatic disease. Chronic changes as described. No  osseous lesions are evident. Similar appearance to 2011.  Electronically Signed  By: Rolla Flatten M.D.  On: 11/22/2013 14:08    Impression:  The patient has a small nodule located peripherally in the left upper lobe that is hypermetabolic on PET imaging and highly suspicious for primary lung cancer, clinical stage I (T1-2,N0,M0). He has underlying COPD, chronic  combined systolic and diastolic congestive heart failure, nonischemic cardiomyopathy, permanent atrial fibrillation, moderate pulmonary hypertension, and possible mild-moderate mitral stenosis related to prosthetic valve dysfunction. He is pacemaker-dependent, having undergone AV node ablation for treatment of atrial fibrillation in 2009. He has been very stable from a cardiac standpoint until this past February when he was hospitalized for a brief episode of chest discomfort and shortness of breath associated with hypoxemia. He ruled out for acute myocardial infarction at that time. He has numerous risk factors for the presence of ischemic heart disease, but cardiac catheterization performed in 2009 was notable for the absence of any significant coronary artery disease. He has had 2 previous thoracotomies in the past, including a large right posterolateral thoracotomy more than 30 years ago for spontaneous pneumothorax and more recent right miniature anterior thoracotomy for  mitral valve replacement and closure of atrial septal defect in 2010. Fortunately, the suspicious lung lesion is on the left side. Spirometry demonstrates the presence of moderate obstructive disease, but his diffusion capacity is severely reduced. The patient quit smoking 3 months ago. Risks associated with surgery will unquestionably elevated but are likely not neccessarily prohibitive. Because of the severity of underlying chronic lung disease with combination of chronic diastolic congestive heart failure the patient would not be considered candidate for lobectomy. However, the left lung nodule is peripherally located and potentially suitable for wedge resection. SBRT could be considered as an alternative to high risk surgical resection if tissue diagnosis of malignancy could be ascertained using TTNA or ENB for biopsy.     Plan:  I discussed options at length with the patient and his wife in the office today.  We discussed the risks associated with surgical resection including particular concerns regarding the possibility of postoperative respiratory failure. We contrasted surgical therapy for wedge resection with an alternative strategy of SBRT.  The patient desires to proceed with surgery as soon as practical. He understands and accepts all potential associated risks including but not limited to risk of death, stroke, myocardial infarction, respiratory failure, congestive heart failure, bleeding requiring blood transfusion, arrhythmia, prolonged chest wall pain, prolonged air leak requiring chest tube drainage, or late recurrence of malignancy. All of his questions been addressed.  We plan to proceed with surgery on Friday, 11/29/2013. The patient has been instructed to continue all medications without change through the night before surgery. On the morning of surgery the patient will take only carvedilol with a sip of water.  I spent in excess of 15 minutes during the conduct of this office  consultation and >50% of this time involved direct face-to-face encounter with the patient for counseling and/or coordination of their care.   Valentina Gu. Roxy Manns, MD 11/25/2013 10:30 AM

## 2013-11-25 NOTE — Patient Instructions (Signed)
Continue all current medications without change through the night before surgery.  On the morning of surgery take only your carvedilol (COREG) with a sip of water.

## 2013-11-26 ENCOUNTER — Encounter (HOSPITAL_COMMUNITY): Payer: Self-pay | Admitting: Pharmacy Technician

## 2013-11-28 ENCOUNTER — Encounter (HOSPITAL_COMMUNITY)
Admission: RE | Admit: 2013-11-28 | Discharge: 2013-11-28 | Disposition: A | Discharge status: Home / Self Care | Payer: Medicare Other | Source: Ambulatory Visit | Attending: Thoracic Surgery (Cardiothoracic Vascular Surgery) | Admitting: Thoracic Surgery (Cardiothoracic Vascular Surgery)

## 2013-11-28 ENCOUNTER — Encounter (HOSPITAL_COMMUNITY): Payer: Self-pay

## 2013-11-28 VITALS — BP 94/68 | HR 74 | Temp 97.7°F | Resp 18 | Ht 74.0 in | Wt 208.2 lb

## 2013-11-28 DIAGNOSIS — Z87891 Personal history of nicotine dependence: Secondary | ICD-10-CM | POA: Diagnosis not present

## 2013-11-28 DIAGNOSIS — R339 Retention of urine, unspecified: Secondary | ICD-10-CM | POA: Diagnosis not present

## 2013-11-28 DIAGNOSIS — I701 Atherosclerosis of renal artery: Secondary | ICD-10-CM | POA: Diagnosis present

## 2013-11-28 DIAGNOSIS — Z8673 Personal history of transient ischemic attack (TIA), and cerebral infarction without residual deficits: Secondary | ICD-10-CM | POA: Diagnosis not present

## 2013-11-28 DIAGNOSIS — J90 Pleural effusion, not elsewhere classified: Secondary | ICD-10-CM | POA: Diagnosis not present

## 2013-11-28 DIAGNOSIS — Z823 Family history of stroke: Secondary | ICD-10-CM | POA: Diagnosis not present

## 2013-11-28 DIAGNOSIS — C3410 Malignant neoplasm of upper lobe, unspecified bronchus or lung: Secondary | ICD-10-CM | POA: Diagnosis not present

## 2013-11-28 DIAGNOSIS — E785 Hyperlipidemia, unspecified: Secondary | ICD-10-CM | POA: Diagnosis present

## 2013-11-28 DIAGNOSIS — I739 Peripheral vascular disease, unspecified: Secondary | ICD-10-CM | POA: Diagnosis present

## 2013-11-28 DIAGNOSIS — I4891 Unspecified atrial fibrillation: Secondary | ICD-10-CM | POA: Diagnosis present

## 2013-11-28 DIAGNOSIS — I2789 Other specified pulmonary heart diseases: Secondary | ICD-10-CM | POA: Diagnosis not present

## 2013-11-28 DIAGNOSIS — Z951 Presence of aortocoronary bypass graft: Secondary | ICD-10-CM | POA: Diagnosis not present

## 2013-11-28 DIAGNOSIS — R091 Pleurisy: Secondary | ICD-10-CM | POA: Diagnosis present

## 2013-11-28 DIAGNOSIS — I69919 Unspecified symptoms and signs involving cognitive functions following unspecified cerebrovascular disease: Secondary | ICD-10-CM | POA: Diagnosis not present

## 2013-11-28 DIAGNOSIS — E119 Type 2 diabetes mellitus without complications: Secondary | ICD-10-CM | POA: Diagnosis present

## 2013-11-28 DIAGNOSIS — J438 Other emphysema: Secondary | ICD-10-CM | POA: Diagnosis present

## 2013-11-28 DIAGNOSIS — Z7982 Long term (current) use of aspirin: Secondary | ICD-10-CM | POA: Diagnosis not present

## 2013-11-28 DIAGNOSIS — J984 Other disorders of lung: Secondary | ICD-10-CM | POA: Diagnosis not present

## 2013-11-28 DIAGNOSIS — I501 Left ventricular failure: Secondary | ICD-10-CM | POA: Diagnosis not present

## 2013-11-28 DIAGNOSIS — R0602 Shortness of breath: Secondary | ICD-10-CM | POA: Diagnosis not present

## 2013-11-28 DIAGNOSIS — Z4682 Encounter for fitting and adjustment of non-vascular catheter: Secondary | ICD-10-CM | POA: Diagnosis not present

## 2013-11-28 DIAGNOSIS — R222 Localized swelling, mass and lump, trunk: Secondary | ICD-10-CM | POA: Diagnosis not present

## 2013-11-28 DIAGNOSIS — J9383 Other pneumothorax: Secondary | ICD-10-CM | POA: Diagnosis not present

## 2013-11-28 DIAGNOSIS — Z950 Presence of cardiac pacemaker: Secondary | ICD-10-CM | POA: Diagnosis not present

## 2013-11-28 DIAGNOSIS — Z800 Family history of malignant neoplasm of digestive organs: Secondary | ICD-10-CM | POA: Diagnosis not present

## 2013-11-28 DIAGNOSIS — I5042 Chronic combined systolic (congestive) and diastolic (congestive) heart failure: Secondary | ICD-10-CM | POA: Diagnosis not present

## 2013-11-28 DIAGNOSIS — J9610 Chronic respiratory failure, unspecified whether with hypoxia or hypercapnia: Secondary | ICD-10-CM | POA: Diagnosis not present

## 2013-11-28 DIAGNOSIS — I509 Heart failure, unspecified: Secondary | ICD-10-CM | POA: Diagnosis present

## 2013-11-28 DIAGNOSIS — I252 Old myocardial infarction: Secondary | ICD-10-CM | POA: Diagnosis not present

## 2013-11-28 DIAGNOSIS — R918 Other nonspecific abnormal finding of lung field: Secondary | ICD-10-CM

## 2013-11-28 DIAGNOSIS — Z954 Presence of other heart-valve replacement: Secondary | ICD-10-CM | POA: Diagnosis not present

## 2013-11-28 DIAGNOSIS — J9819 Other pulmonary collapse: Secondary | ICD-10-CM | POA: Diagnosis not present

## 2013-11-28 DIAGNOSIS — K219 Gastro-esophageal reflux disease without esophagitis: Secondary | ICD-10-CM | POA: Diagnosis present

## 2013-11-28 DIAGNOSIS — I2510 Atherosclerotic heart disease of native coronary artery without angina pectoris: Secondary | ICD-10-CM | POA: Diagnosis present

## 2013-11-28 DIAGNOSIS — I10 Essential (primary) hypertension: Secondary | ICD-10-CM | POA: Diagnosis not present

## 2013-11-28 HISTORY — DX: Headache: R51

## 2013-11-28 HISTORY — DX: Presence of cardiac pacemaker: Z95.0

## 2013-11-28 HISTORY — DX: Type 2 diabetes mellitus without complications: E11.9

## 2013-11-28 LAB — COMPREHENSIVE METABOLIC PANEL
ALBUMIN: 3.7 g/dL (ref 3.5–5.2)
ALT: 14 U/L (ref 0–53)
AST: 19 U/L (ref 0–37)
Alkaline Phosphatase: 62 U/L (ref 39–117)
BUN: 22 mg/dL (ref 6–23)
CHLORIDE: 101 meq/L (ref 96–112)
CO2: 21 mEq/L (ref 19–32)
Calcium: 9.6 mg/dL (ref 8.4–10.5)
Creatinine, Ser: 0.84 mg/dL (ref 0.50–1.35)
GFR calc Af Amer: >90 mL/min (ref >90)
GFR calc non Af Amer: 89 mL/min — ABNORMAL LOW (ref >90)
Glucose, Bld: 174 mg/dL — ABNORMAL HIGH (ref 70–99)
POTASSIUM: 4.7 meq/L (ref 3.7–5.3)
SODIUM: 138 meq/L (ref 137–147)
TOTAL PROTEIN: 6.8 g/dL (ref 6.0–8.3)
Total Bilirubin: 2.1 mg/dL — ABNORMAL HIGH (ref 0.3–1.2)

## 2013-11-28 LAB — BLOOD GAS, ARTERIAL
Acid-Base Excess: 1.8 mmol/L (ref 0.0–2.0)
Bicarbonate: 25.7 mEq/L — ABNORMAL HIGH (ref 20.0–24.0)
DRAWN BY: 20634
FIO2: 0.21 %
O2 SAT: 93.3 %
PATIENT TEMPERATURE: 98.6
TCO2: 26.9 mmol/L (ref 0–100)
pCO2 arterial: 39.1 mmHg (ref 35.0–45.0)
pH, Arterial: 7.433 (ref 7.350–7.450)
pO2, Arterial: 65.5 mmHg — ABNORMAL LOW (ref 80.0–100.0)

## 2013-11-28 LAB — URINALYSIS, ROUTINE W REFLEX MICROSCOPIC
BILIRUBIN URINE: NEGATIVE
Glucose, UA: 100 mg/dL — AB
Hgb urine dipstick: NEGATIVE
Ketones, ur: NEGATIVE mg/dL
LEUKOCYTES UA: NEGATIVE
Nitrite: NEGATIVE
Protein, ur: NEGATIVE mg/dL
Specific Gravity, Urine: 1.014 (ref 1.005–1.030)
UROBILINOGEN UA: 1 mg/dL (ref 0.0–1.0)
pH: 5 (ref 5.0–8.0)

## 2013-11-28 LAB — CBC
HCT: 45.3 % (ref 39.0–52.0)
Hemoglobin: 15.2 g/dL (ref 13.0–17.0)
MCH: 31.7 pg (ref 26.0–34.0)
MCHC: 33.6 g/dL (ref 30.0–36.0)
MCV: 94.4 fL (ref 78.0–100.0)
PLATELETS: 143 10*3/uL — AB (ref 150–400)
RBC: 4.8 MIL/uL (ref 4.22–5.81)
RDW: 15.8 % — AB (ref 11.5–15.5)
WBC: 7.4 10*3/uL (ref 4.0–10.5)

## 2013-11-28 LAB — TYPE AND SCREEN
ABO/RH(D): O POS
ANTIBODY SCREEN: NEGATIVE

## 2013-11-28 LAB — APTT: APTT: 39 s — AB (ref 24–37)

## 2013-11-28 LAB — PROTIME-INR
INR: 1.09 (ref 0.00–1.49)
PROTHROMBIN TIME: 13.9 s (ref 11.6–15.2)

## 2013-11-28 LAB — SURGICAL PCR SCREEN
MRSA, PCR: NEGATIVE
STAPHYLOCOCCUS AUREUS: NEGATIVE

## 2013-11-28 MED ORDER — DEXTROSE 5 % IV SOLN
1.5000 g | INTRAVENOUS | Status: AC
  Administered 2013-11-29: 1.5 g via INTRAVENOUS
  Filled 2013-11-28: qty 1.5

## 2013-11-28 NOTE — Pre-Procedure Instructions (Signed)
CARR SHARTZER  11/28/2013   Your procedure is scheduled on: Friday, Nov 29, 2013   Report to [facility] (use Main Entrance "A') at 5:30 AM.  Call this number if you have problems the morning of surgery: [phone]   Remember:   Do not eat food or drink liquids after midnight. TONIGHT   Take these medicines the morning of surgery with A SIP OF WATER: carvedilol (COREG),   isosorbide dinitrate (ISORDIL), budesonide-formoterol (SYMBICORT), if needed: tylenol for mild pain, Nitrostat for chest pain  Do not wear jewelry  Do not wear lotions, powders, or perfumes. You may NOT wear deodorant.    Men may shave face and neck.   Do not bring valuables to the hospital.  [facility] is not responsible for any belongings or valuables.                Contacts, dentures or bridgework may not be worn into surgery.   Leave suitcase in the car. After surgery it may be brought to your room.   For patients admitted to the hospital, discharge time is determined by your treatment team.                Patients discharged the day of surgery will not be allowed to drive home.  Name and phone number of your driver:   Special Instructions:  Special Instructions:Special Instructions: [facility] - Preparing for Surgery  Before surgery, you can play an important role.  Because skin is not sterile, your skin needs to be as free of germs as possible.  You can reduce the number of germs on you skin by washing with CHG (chlorahexidine gluconate) soap before surgery.  CHG is an antiseptic cleaner which kills germs and bonds with the skin to continue killing germs even after washing.  Please DO NOT use if you have an allergy to CHG or antibacterial soaps.  If your skin becomes reddened/irritated stop using the CHG and inform your nurse when you arrive at Short Stay.  Do not shave (including legs and underarms) for at least 48 hours prior to the first CHG shower.  You may shave your face.  Please  follow these instructions carefully:   1.  Shower with CHG Soap the night before surgery and the morning of Surgery.  2.  If you choose to wash your hair, wash your hair first as usual with your normal shampoo.  3.  After you shampoo, rinse your hair and body thoroughly to remove the Shampoo.  4.  Use CHG as you would any other liquid soap.  You can apply chg directly  to the skin and wash gently with scrungie or a clean washcloth.  5.  Apply the CHG Soap to your body ONLY FROM THE NECK DOWN.  Do not use on open wounds or open sores.  Avoid contact with your eyes, ears, mouth and genitals (private parts).  Wash genitals (private parts) with your normal soap.  6.  Wash thoroughly, paying special attention to the area where your surgery will be performed.  7.  Thoroughly rinse your body with warm water from the neck down.  8.  DO NOT shower/wash with your normal soap after using and rinsing off the CHG Soap.  9.  Pat yourself dry with a clean towel.            10.  Wear clean pajamas.            11.  Place clean  sheets on your bed the night of your first shower and do not sleep with pets.  Day of Surgery  Do not apply any lotions/deodorants the morning of surgery.  Please wear clean clothes to the hospital/surgery center.   Please read over the following fact sheets that you were given: Pain Booklet, Coughing and Deep Breathing, Blood Transfusion Information, MRSA Information and Surgical Site Infection Prevention

## 2013-11-28 NOTE — Progress Notes (Signed)
Spoke with [employer] rep., made him aware of pt. Surgery , in addition the time & what the order states from Dr. Lovena Le.

## 2013-11-28 NOTE — Progress Notes (Signed)
Call to [place], asked for rep. To be paged for informing them that surgery is scheduled for 0730 tomorrow a.m.

## 2013-11-28 NOTE — Progress Notes (Signed)
Anesthesia Chart Review:  Patient is a 68 year old male scheduled for left VATS, wedge resection tomorrow by Dr. Roxy Manns.  He has a left lung nodule suspicious for primary lung cancer.   History includes former smoker, HTN, dyslipidemia, afib, endocarditis, right mini-thoracotomy for MV replacement (tissue) and closure of ASD '10, non-ischemic cardiomyopathy, chronic combined CHF, s/p [employer] biventricular ICD (generator change 02/06/12), pulmonary hypertension, renal artery stenosis (30-40% right RAS by 2009 cath), HLD, DM2, COPD, spontaneous right PTX s/p right thoracotomy > 30 years ago. Cardiologist is Dr. Ron Parker who cleared patient for pulmonary surgery.  EP cardiologist is Dr. Cristopher Peru. Pulmonologist is Dr. Elsworth Soho who referred patient to [facility].  11/20/13 Cardiopulmonary stress test showed: Conclusion: Interpretation of the exercise test with gas exchange is limited due to the submaximal aerobic effort. Based on available data, there is a moderate-to-severe functional limitation due primarily to a circulatory limitation. There is significant chronotropic incompetence. Given the patient's test results he is felt to be at moderate to high risk for peri-operative cardiac complications. (See Results Review tab for complete details.)  Echo from 08/13/13 showed: Impressions: When compared to the prior study from 06/26/2013 the LV cavity is now mildly dilated. Transmitral gradient across bioprosthetic valve are elevated when compared to the prior study. Leaflet motion is not well visualized. If clinically indicated afluoroscopy or TEE is recommended. LV akinesis of the apex, apical anterior, septal, inferior wall and hypokinesis of the mid and basal inferior and inferolateral wall, LVEF 30-35%, mildly dilated aortic root (44 mm). (See Results Review tab for full report.)  Right heart cath on 08/16/13 showed: Impression: Significant pulmonary hypertension PCWP not that high given EF Certainly his resting hypoxia  is dysproportionate to CHF  And implies it is from his advanced underlying lung disease. Would not want his PCWP much lower as his CO is already borderline. Add nitrates and continue current diuretic dose. (Left heart cath on 06/17/08 showed luminal irregularities of the LCX, LAD but otherwise no critical coronary artery stenosis.)  EKG on 11/28/13 showed: SR with first degree AVB, right BBB, cannot rule out anterior infarct (age undetermined).  Preoperative CXR, PFTs, labs noted.   Cardiology and pulmonology are aware of plans for surgery. If no acute changes then I would anticipate that he could proceed as planned.  George Hugh [facility] Stay [facility]/Anesthesiology Phone [phone] 11/28/2013 6:39 PM

## 2013-11-28 NOTE — Progress Notes (Signed)
Call to [facility], Anesth. PA-C,  Reported that pt. Is seen by [facility] & has PPC/ICD. In addition, pt. Uses O2 2 liters at night .  She will review the chart.

## 2013-11-29 ENCOUNTER — Inpatient Hospital Stay (HOSPITAL_COMMUNITY): Payer: Medicare Other

## 2013-11-29 ENCOUNTER — Encounter (HOSPITAL_COMMUNITY): Payer: Self-pay | Admitting: *Deleted

## 2013-11-29 ENCOUNTER — Encounter (HOSPITAL_COMMUNITY): Payer: Medicare Other | Admitting: Vascular Surgery

## 2013-11-29 ENCOUNTER — Inpatient Hospital Stay (HOSPITAL_COMMUNITY)
Admission: RE | Admit: 2013-11-29 | Discharge: 2013-12-03 | DRG: 164 | Disposition: A | Discharge status: Home / Self Care | Payer: Medicare Other | Source: Ambulatory Visit | Attending: Thoracic Surgery (Cardiothoracic Vascular Surgery) | Admitting: Thoracic Surgery (Cardiothoracic Vascular Surgery)

## 2013-11-29 ENCOUNTER — Encounter (HOSPITAL_COMMUNITY)
Admission: RE | Disposition: A | Discharge status: Home / Self Care | Payer: Self-pay | Source: Ambulatory Visit | Attending: Thoracic Surgery (Cardiothoracic Vascular Surgery)

## 2013-11-29 ENCOUNTER — Inpatient Hospital Stay (HOSPITAL_COMMUNITY): Payer: Medicare Other | Admitting: Certified Registered Nurse Anesthetist

## 2013-11-29 DIAGNOSIS — Z7982 Long term (current) use of aspirin: Secondary | ICD-10-CM

## 2013-11-29 DIAGNOSIS — I501 Left ventricular failure: Secondary | ICD-10-CM | POA: Diagnosis not present

## 2013-11-29 DIAGNOSIS — I4821 Permanent atrial fibrillation: Secondary | ICD-10-CM | POA: Diagnosis present

## 2013-11-29 DIAGNOSIS — C3410 Malignant neoplasm of upper lobe, unspecified bronchus or lung: Principal | ICD-10-CM | POA: Diagnosis present

## 2013-11-29 DIAGNOSIS — T8209XA Other mechanical complication of heart valve prosthesis, initial encounter: Secondary | ICD-10-CM | POA: Diagnosis present

## 2013-11-29 DIAGNOSIS — I4891 Unspecified atrial fibrillation: Secondary | ICD-10-CM | POA: Diagnosis present

## 2013-11-29 DIAGNOSIS — K219 Gastro-esophageal reflux disease without esophagitis: Secondary | ICD-10-CM | POA: Diagnosis present

## 2013-11-29 DIAGNOSIS — C3490 Malignant neoplasm of unspecified part of unspecified bronchus or lung: Secondary | ICD-10-CM

## 2013-11-29 DIAGNOSIS — I152 Hypertension secondary to endocrine disorders: Secondary | ICD-10-CM | POA: Diagnosis present

## 2013-11-29 DIAGNOSIS — Z954 Presence of other heart-valve replacement: Secondary | ICD-10-CM

## 2013-11-29 DIAGNOSIS — I5042 Chronic combined systolic (congestive) and diastolic (congestive) heart failure: Secondary | ICD-10-CM | POA: Diagnosis present

## 2013-11-29 DIAGNOSIS — E1159 Type 2 diabetes mellitus with other circulatory complications: Secondary | ICD-10-CM | POA: Diagnosis present

## 2013-11-29 DIAGNOSIS — Z952 Presence of prosthetic heart valve: Secondary | ICD-10-CM

## 2013-11-29 DIAGNOSIS — I272 Pulmonary hypertension, unspecified: Secondary | ICD-10-CM | POA: Diagnosis present

## 2013-11-29 DIAGNOSIS — Z823 Family history of stroke: Secondary | ICD-10-CM

## 2013-11-29 DIAGNOSIS — I2789 Other specified pulmonary heart diseases: Secondary | ICD-10-CM | POA: Diagnosis present

## 2013-11-29 DIAGNOSIS — Z800 Family history of malignant neoplasm of digestive organs: Secondary | ICD-10-CM

## 2013-11-29 DIAGNOSIS — Z95810 Presence of automatic (implantable) cardiac defibrillator: Secondary | ICD-10-CM | POA: Diagnosis present

## 2013-11-29 DIAGNOSIS — I428 Other cardiomyopathies: Secondary | ICD-10-CM | POA: Diagnosis present

## 2013-11-29 DIAGNOSIS — J9819 Other pulmonary collapse: Secondary | ICD-10-CM | POA: Diagnosis not present

## 2013-11-29 DIAGNOSIS — I69919 Unspecified symptoms and signs involving cognitive functions following unspecified cerebrovascular disease: Secondary | ICD-10-CM | POA: Diagnosis not present

## 2013-11-29 DIAGNOSIS — I701 Atherosclerosis of renal artery: Secondary | ICD-10-CM | POA: Diagnosis present

## 2013-11-29 DIAGNOSIS — I10 Essential (primary) hypertension: Secondary | ICD-10-CM | POA: Diagnosis not present

## 2013-11-29 DIAGNOSIS — I5022 Chronic systolic (congestive) heart failure: Secondary | ICD-10-CM | POA: Diagnosis present

## 2013-11-29 DIAGNOSIS — I252 Old myocardial infarction: Secondary | ICD-10-CM

## 2013-11-29 DIAGNOSIS — Z8673 Personal history of transient ischemic attack (TIA), and cerebral infarction without residual deficits: Secondary | ICD-10-CM

## 2013-11-29 DIAGNOSIS — J9610 Chronic respiratory failure, unspecified whether with hypoxia or hypercapnia: Secondary | ICD-10-CM | POA: Diagnosis not present

## 2013-11-29 DIAGNOSIS — R918 Other nonspecific abnormal finding of lung field: Secondary | ICD-10-CM

## 2013-11-29 DIAGNOSIS — Z951 Presence of aortocoronary bypass graft: Secondary | ICD-10-CM

## 2013-11-29 DIAGNOSIS — E119 Type 2 diabetes mellitus without complications: Secondary | ICD-10-CM | POA: Diagnosis present

## 2013-11-29 DIAGNOSIS — E785 Hyperlipidemia, unspecified: Secondary | ICD-10-CM | POA: Diagnosis present

## 2013-11-29 DIAGNOSIS — I2510 Atherosclerotic heart disease of native coronary artery without angina pectoris: Secondary | ICD-10-CM | POA: Diagnosis present

## 2013-11-29 DIAGNOSIS — J449 Chronic obstructive pulmonary disease, unspecified: Secondary | ICD-10-CM | POA: Diagnosis present

## 2013-11-29 DIAGNOSIS — I739 Peripheral vascular disease, unspecified: Secondary | ICD-10-CM | POA: Diagnosis present

## 2013-11-29 DIAGNOSIS — Z950 Presence of cardiac pacemaker: Secondary | ICD-10-CM

## 2013-11-29 DIAGNOSIS — R911 Solitary pulmonary nodule: Secondary | ICD-10-CM | POA: Diagnosis present

## 2013-11-29 DIAGNOSIS — J438 Other emphysema: Secondary | ICD-10-CM | POA: Diagnosis present

## 2013-11-29 DIAGNOSIS — R339 Retention of urine, unspecified: Secondary | ICD-10-CM | POA: Diagnosis not present

## 2013-11-29 DIAGNOSIS — R091 Pleurisy: Secondary | ICD-10-CM | POA: Diagnosis present

## 2013-11-29 DIAGNOSIS — I509 Heart failure, unspecified: Secondary | ICD-10-CM | POA: Diagnosis present

## 2013-11-29 DIAGNOSIS — Z87891 Personal history of nicotine dependence: Secondary | ICD-10-CM

## 2013-11-29 DIAGNOSIS — Z9889 Other specified postprocedural states: Secondary | ICD-10-CM

## 2013-11-29 DIAGNOSIS — R222 Localized swelling, mass and lump, trunk: Secondary | ICD-10-CM

## 2013-11-29 HISTORY — PX: VIDEO ASSISTED THORACOSCOPY (VATS)/WEDGE RESECTION: SHX6174

## 2013-11-29 HISTORY — DX: Malignant neoplasm of unspecified part of unspecified bronchus or lung: C34.90

## 2013-11-29 LAB — GLUCOSE, CAPILLARY
GLUCOSE-CAPILLARY: 175 mg/dL — AB (ref 70–99)
GLUCOSE-CAPILLARY: 176 mg/dL — AB (ref 70–99)
Glucose-Capillary: 152 mg/dL — ABNORMAL HIGH (ref 70–99)
Glucose-Capillary: 148 mg/dL — ABNORMAL HIGH (ref 70–99)

## 2013-11-29 SURGERY — VIDEO ASSISTED THORACOSCOPY (VATS)/WEDGE RESECTION
Anesthesia: General | Site: Chest | Laterality: Left

## 2013-11-29 MED ORDER — GLYCOPYRROLATE 0.2 MG/ML IJ SOLN
INTRAMUSCULAR | Status: AC
  Filled 2013-11-29: qty 4

## 2013-11-29 MED ORDER — OXYCODONE HCL 5 MG PO TABS
ORAL_TABLET | ORAL | Status: AC
  Filled 2013-11-29: qty 2

## 2013-11-29 MED ORDER — KCL IN DEXTROSE-NACL 20-5-0.45 MEQ/L-%-% IV SOLN
INTRAVENOUS | Status: DC
  Administered 2013-11-29: 16:00:00 via INTRAVENOUS
  Filled 2013-11-29 (×2): qty 1000

## 2013-11-29 MED ORDER — ROCURONIUM BROMIDE 100 MG/10ML IV SOLN
INTRAVENOUS | Status: DC | PRN
  Administered 2013-11-29: 50 mg via INTRAVENOUS
  Administered 2013-11-29: 30 mg via INTRAVENOUS

## 2013-11-29 MED ORDER — BUPIVACAINE 0.5 % ON-Q PUMP SINGLE CATH 400 ML
400.0000 mL | INJECTION | Status: AC
  Filled 2013-11-29: qty 400

## 2013-11-29 MED ORDER — CARVEDILOL 3.125 MG PO TABS
3.1250 mg | ORAL_TABLET | Freq: Two times a day (BID) | ORAL | Status: DC
  Administered 2013-11-29 – 2013-12-03 (×7): 3.125 mg via ORAL
  Filled 2013-11-29 (×10): qty 1

## 2013-11-29 MED ORDER — MIDAZOLAM HCL 2 MG/2ML IJ SOLN
INTRAMUSCULAR | Status: AC
  Administered 2013-11-29: 2 mg via INTRAVENOUS
  Filled 2013-11-29: qty 2

## 2013-11-29 MED ORDER — DEXTROSE 5 % IV SOLN
1.5000 g | Freq: Two times a day (BID) | INTRAVENOUS | Status: AC
  Administered 2013-11-29 – 2013-11-30 (×2): 1.5 g via INTRAVENOUS
  Filled 2013-11-29 (×2): qty 1.5

## 2013-11-29 MED ORDER — FENTANYL CITRATE 0.05 MG/ML IJ SOLN
INTRAMUSCULAR | Status: DC | PRN
  Administered 2013-11-29 (×2): 50 ug via INTRAVENOUS
  Administered 2013-11-29 (×2): 100 ug via INTRAVENOUS

## 2013-11-29 MED ORDER — BISACODYL 5 MG PO TBEC
10.0000 mg | DELAYED_RELEASE_TABLET | Freq: Every day | ORAL | Status: DC
  Administered 2013-11-30 – 2013-12-01 (×2): 10 mg via ORAL
  Filled 2013-11-29 (×2): qty 2

## 2013-11-29 MED ORDER — FUROSEMIDE 10 MG/ML IJ SOLN
40.0000 mg | Freq: Once | INTRAMUSCULAR | Status: AC
  Administered 2013-11-29: 40 mg via INTRAVENOUS
  Filled 2013-11-29: qty 4

## 2013-11-29 MED ORDER — TRAMADOL HCL 50 MG PO TABS
50.0000 mg | ORAL_TABLET | Freq: Four times a day (QID) | ORAL | Status: DC | PRN
  Administered 2013-11-29 – 2013-12-03 (×2): 100 mg via ORAL
  Filled 2013-11-29: qty 2
  Filled 2013-11-29 (×2): qty 1

## 2013-11-29 MED ORDER — ALBUTEROL SULFATE (2.5 MG/3ML) 0.083% IN NEBU
2.5000 mg | INHALATION_SOLUTION | RESPIRATORY_TRACT | Status: DC
  Administered 2013-11-29 – 2013-12-01 (×5): 2.5 mg via RESPIRATORY_TRACT
  Filled 2013-11-29 (×5): qty 3

## 2013-11-29 MED ORDER — ACETAMINOPHEN 160 MG/5ML PO SOLN: 325.0000 mg | ORAL | Status: DC | PRN

## 2013-11-29 MED ORDER — ONDANSETRON HCL 4 MG/2ML IJ SOLN
INTRAMUSCULAR | Status: DC | PRN
  Administered 2013-11-29: 4 mg via INTRAVENOUS

## 2013-11-29 MED ORDER — OXYCODONE HCL 5 MG PO TABS
5.0000 mg | ORAL_TABLET | ORAL | Status: DC | PRN
  Administered 2013-11-29 – 2013-11-30 (×4): 10 mg via ORAL
  Administered 2013-12-01: 5 mg via ORAL
  Administered 2013-12-01 (×2): 10 mg via ORAL
  Administered 2013-12-01: 5 mg via ORAL
  Administered 2013-12-02 (×3): 10 mg via ORAL
  Filled 2013-11-29 (×3): qty 2
  Filled 2013-11-29: qty 1
  Filled 2013-11-29 (×6): qty 2

## 2013-11-29 MED ORDER — INSULIN ASPART 100 UNIT/ML ~~LOC~~ SOLN
0.0000 [IU] | SUBCUTANEOUS | Status: DC
  Administered 2013-11-29: 4 [IU] via SUBCUTANEOUS
  Administered 2013-11-29 – 2013-11-30 (×3): 2 [IU] via SUBCUTANEOUS
  Administered 2013-11-30: 4 [IU] via SUBCUTANEOUS

## 2013-11-29 MED ORDER — ASPIRIN EC 81 MG PO TBEC
81.0000 mg | DELAYED_RELEASE_TABLET | Freq: Every day | ORAL | Status: DC
  Administered 2013-11-29 – 2013-12-02 (×4): 81 mg via ORAL
  Filled 2013-11-29 (×5): qty 1

## 2013-11-29 MED ORDER — OXYCODONE HCL 5 MG PO TABS: 5.0000 mg | ORAL_TABLET | Freq: Once | ORAL | Status: DC | PRN

## 2013-11-29 MED ORDER — SENNOSIDES-DOCUSATE SODIUM 8.6-50 MG PO TABS
1.0000 | ORAL_TABLET | Freq: Every day | ORAL | Status: DC
  Administered 2013-11-29 – 2013-12-02 (×4): 1 via ORAL
  Filled 2013-11-29 (×5): qty 1

## 2013-11-29 MED ORDER — NEOSTIGMINE METHYLSULFATE 10 MG/10ML IV SOLN
INTRAVENOUS | Status: AC
  Filled 2013-11-29: qty 1

## 2013-11-29 MED ORDER — FENTANYL CITRATE 0.05 MG/ML IJ SOLN
INTRAMUSCULAR | Status: AC
  Filled 2013-11-29: qty 5

## 2013-11-29 MED ORDER — BUPIVACAINE 0.5 % ON-Q PUMP SINGLE CATH 400 ML
INJECTION | Status: DC | PRN
  Administered 2013-11-29: 400 mL

## 2013-11-29 MED ORDER — ATORVASTATIN CALCIUM 20 MG PO TABS
20.0000 mg | ORAL_TABLET | Freq: Every day | ORAL | Status: DC
  Administered 2013-11-29 – 2013-12-02 (×4): 20 mg via ORAL
  Filled 2013-11-29 (×5): qty 1

## 2013-11-29 MED ORDER — MIDAZOLAM HCL 2 MG/2ML IJ SOLN
INTRAMUSCULAR | Status: AC
  Filled 2013-11-29: qty 2

## 2013-11-29 MED ORDER — 0.9 % SODIUM CHLORIDE (POUR BTL) OPTIME
TOPICAL | Status: DC | PRN
  Administered 2013-11-29: 1000 mL

## 2013-11-29 MED ORDER — IRBESARTAN 75 MG PO TABS
75.0000 mg | ORAL_TABLET | Freq: Every day | ORAL | Status: DC
  Administered 2013-11-29 – 2013-12-02 (×4): 75 mg via ORAL
  Filled 2013-11-29 (×5): qty 1

## 2013-11-29 MED ORDER — ROCURONIUM BROMIDE 50 MG/5ML IV SOLN
INTRAVENOUS | Status: AC
  Filled 2013-11-29: qty 1

## 2013-11-29 MED ORDER — PROPOFOL 10 MG/ML IV BOLUS
INTRAVENOUS | Status: DC | PRN
  Administered 2013-11-29: 100 mg via INTRAVENOUS

## 2013-11-29 MED ORDER — BUDESONIDE-FORMOTEROL FUMARATE 160-4.5 MCG/ACT IN AERO
2.0000 | INHALATION_SPRAY | Freq: Two times a day (BID) | RESPIRATORY_TRACT | Status: DC
  Administered 2013-11-29 – 2013-12-03 (×8): 2 via RESPIRATORY_TRACT
  Filled 2013-11-29: qty 6

## 2013-11-29 MED ORDER — ARTIFICIAL TEARS OP OINT
TOPICAL_OINTMENT | OPHTHALMIC | Status: DC | PRN
  Administered 2013-11-29: 1 via OPHTHALMIC

## 2013-11-29 MED ORDER — PROPOFOL 10 MG/ML IV BOLUS
INTRAVENOUS | Status: AC
  Filled 2013-11-29: qty 20

## 2013-11-29 MED ORDER — LIDOCAINE HCL (CARDIAC) 20 MG/ML IV SOLN
INTRAVENOUS | Status: AC
  Filled 2013-11-29: qty 5

## 2013-11-29 MED ORDER — GLYCOPYRROLATE 0.2 MG/ML IJ SOLN
INTRAMUSCULAR | Status: DC | PRN
  Administered 2013-11-29: .8 mg via INTRAVENOUS

## 2013-11-29 MED ORDER — FUROSEMIDE 10 MG/ML IJ SOLN
INTRAMUSCULAR | Status: AC
Start: 2013-11-29 — End: 2013-11-29
  Filled 2013-11-29: qty 4

## 2013-11-29 MED ORDER — FENTANYL CITRATE 0.05 MG/ML IJ SOLN
INTRAMUSCULAR | Status: AC
  Administered 2013-11-29: 100 ug via INTRAVENOUS
  Filled 2013-11-29: qty 2

## 2013-11-29 MED ORDER — HEMOSTATIC AGENTS (NO CHARGE) OPTIME
TOPICAL | Status: DC | PRN
  Administered 2013-11-29: 1 via TOPICAL

## 2013-11-29 MED ORDER — HYDROMORPHONE HCL PF 1 MG/ML IJ SOLN
0.2500 mg | INTRAMUSCULAR | Status: DC | PRN
  Administered 2013-11-29 (×2): 0.5 mg via INTRAVENOUS

## 2013-11-29 MED ORDER — POTASSIUM CHLORIDE 10 MEQ/50ML IV SOLN
10.0000 meq | Freq: Every day | INTRAVENOUS | Status: DC | PRN
  Filled 2013-11-29: qty 50

## 2013-11-29 MED ORDER — ISOSORBIDE DINITRATE 10 MG PO TABS
10.0000 mg | ORAL_TABLET | Freq: Three times a day (TID) | ORAL | Status: DC
  Administered 2013-11-29 – 2013-12-02 (×8): 10 mg via ORAL
  Filled 2013-11-29 (×14): qty 1

## 2013-11-29 MED ORDER — HYDROMORPHONE HCL PF 1 MG/ML IJ SOLN
INTRAMUSCULAR | Status: AC
  Filled 2013-11-29: qty 1

## 2013-11-29 MED ORDER — ACETAMINOPHEN 500 MG PO TABS
1000.0000 mg | ORAL_TABLET | Freq: Four times a day (QID) | ORAL | Status: DC
  Administered 2013-11-29 – 2013-12-03 (×15): 1000 mg via ORAL
  Filled 2013-11-29 (×20): qty 2

## 2013-11-29 MED ORDER — OXYCODONE HCL 5 MG/5ML PO SOLN: 5.0000 mg | Freq: Once | ORAL | Status: DC | PRN

## 2013-11-29 MED ORDER — ACETAMINOPHEN 160 MG/5ML PO SOLN
1000.0000 mg | Freq: Four times a day (QID) | ORAL | Status: DC
  Filled 2013-11-29: qty 40

## 2013-11-29 MED ORDER — ONDANSETRON HCL 4 MG/2ML IJ SOLN: 4.0000 mg | Freq: Four times a day (QID) | INTRAMUSCULAR | Status: DC | PRN

## 2013-11-29 MED ORDER — MORPHINE SULFATE 2 MG/ML IJ SOLN
2.0000 mg | INTRAMUSCULAR | Status: DC | PRN
  Administered 2013-11-29 – 2013-12-01 (×3): 2 mg via INTRAVENOUS
  Filled 2013-11-29 (×3): qty 1

## 2013-11-29 MED ORDER — ACETAMINOPHEN 325 MG PO TABS: 325.0000 mg | ORAL_TABLET | ORAL | Status: DC | PRN

## 2013-11-29 MED ORDER — HYDROMORPHONE HCL PF 1 MG/ML IJ SOLN
0.2500 mg | INTRAMUSCULAR | Status: DC | PRN
  Administered 2013-11-29: 0.5 mg via INTRAVENOUS

## 2013-11-29 MED ORDER — LACTATED RINGERS IV SOLN
INTRAVENOUS | Status: DC | PRN
  Administered 2013-11-29: 07:00:00 via INTRAVENOUS

## 2013-11-29 MED ORDER — NEOSTIGMINE METHYLSULFATE 10 MG/10ML IV SOLN
INTRAVENOUS | Status: DC | PRN
  Administered 2013-11-29: 5 mg via INTRAVENOUS

## 2013-11-29 MED ORDER — ONDANSETRON HCL 4 MG/2ML IJ SOLN
INTRAMUSCULAR | Status: AC
  Filled 2013-11-29: qty 2

## 2013-11-29 MED ORDER — PROMETHAZINE HCL 25 MG/ML IJ SOLN: 6.2500 mg | INTRAMUSCULAR | Status: DC | PRN

## 2013-11-29 SURGICAL SUPPLY — 72 items
APPLICATOR TIP EXT COSEAL (VASCULAR PRODUCTS) IMPLANT
APPLIER CLIP ROT 10 11.4 M/L (STAPLE)
BLADE SURG 11 STRL SS (BLADE) ×2 IMPLANT
CANISTER SUCTION 2500CC (MISCELLANEOUS) ×2 IMPLANT
CATH KIT ON Q 5IN SLV (PAIN MANAGEMENT) ×2 IMPLANT
CATH THORACIC 28FR (CATHETERS) IMPLANT
CATH THORACIC 36FR (CATHETERS) IMPLANT
CATH THORACIC 36FR RT ANG (CATHETERS) IMPLANT
CLIP APPLIE ROT 10 11.4 M/L (STAPLE) IMPLANT
CLIP TI MEDIUM 6 (CLIP) ×2 IMPLANT
CONN ST 1/4X3/8  BEN (MISCELLANEOUS) ×1
CONN ST 1/4X3/8 BEN (MISCELLANEOUS) ×1 IMPLANT
CONT SPEC 4OZ CLIKSEAL STRL BL (MISCELLANEOUS) ×4 IMPLANT
COVER SURGICAL LIGHT HANDLE (MISCELLANEOUS) ×2 IMPLANT
DERMABOND ADHESIVE PROPEN (GAUZE/BANDAGES/DRESSINGS) ×1
DERMABOND ADVANCED (GAUZE/BANDAGES/DRESSINGS)
DERMABOND ADVANCED .7 DNX12 (GAUZE/BANDAGES/DRESSINGS) IMPLANT
DERMABOND ADVANCED .7 DNX6 (GAUZE/BANDAGES/DRESSINGS) ×1 IMPLANT
DRAIN CHANNEL 32F RND 10.7 FF (WOUND CARE) ×2 IMPLANT
DRAPE LAPAROSCOPIC ABDOMINAL (DRAPES) ×2 IMPLANT
DRAPE WARM FLUID 44X44 (DRAPE) ×4 IMPLANT
ELECT REM PT RETURN 9FT ADLT (ELECTROSURGICAL) ×2
ELECTRODE REM PT RTRN 9FT ADLT (ELECTROSURGICAL) ×1 IMPLANT
FLUID NSS /IRRIG 3000 ML XXX (IV SOLUTION) ×2 IMPLANT
GLOVE BIOGEL M STRL SZ7.5 (GLOVE) ×2 IMPLANT
GLOVE EUDERMIC 7 POWDERFREE (GLOVE) ×4 IMPLANT
GOWN STRL REUS W/ TWL LRG LVL3 (GOWN DISPOSABLE) ×2 IMPLANT
GOWN STRL REUS W/TWL LRG LVL3 (GOWN DISPOSABLE) ×2
HEMOSTAT SURGICEL 2X14 (HEMOSTASIS) ×2 IMPLANT
KIT BASIN OR (CUSTOM PROCEDURE TRAY) ×2 IMPLANT
KIT ROOM TURNOVER OR (KITS) ×2 IMPLANT
NS IRRIG 1000ML POUR BTL (IV SOLUTION) ×4 IMPLANT
PACK CHEST (CUSTOM PROCEDURE TRAY) ×2 IMPLANT
PAD ARMBOARD 7.5X6 YLW CONV (MISCELLANEOUS) ×4 IMPLANT
PAD ELECT DEFIB RADIOL ZOLL (MISCELLANEOUS) ×2 IMPLANT
RELOAD TRI 45 ART MED THCK BLK (STAPLE) ×2 IMPLANT
RELOAD TRI 45 ART MED THCK PUR (STAPLE) ×4 IMPLANT
RELOAD TRI 60 ART MED THCK PUR (STAPLE) ×4 IMPLANT
RETRACTOR PVM SOFT TISSUE M (INSTRUMENTS) ×2 IMPLANT
SEALANT SURG COSEAL 4ML (VASCULAR PRODUCTS) IMPLANT
SEALANT SURG COSEAL 8ML (VASCULAR PRODUCTS) IMPLANT
SET IRRIG TUBING LAPAROSCOPIC (IRRIGATION / IRRIGATOR) ×2 IMPLANT
SOLUTION ANTI FOG 6CC (MISCELLANEOUS) ×2 IMPLANT
SPONGE GAUZE 4X4 12PLY (GAUZE/BANDAGES/DRESSINGS) ×2 IMPLANT
SPONGE TONSIL 1 RF SGL (DISPOSABLE) ×2 IMPLANT
SUT MNCRL AB 3-0 PS2 18 (SUTURE) ×2 IMPLANT
SUT PROLENE 3 0 SH DA (SUTURE) IMPLANT
SUT PROLENE 4 0 RB 1 (SUTURE)
SUT PROLENE 4-0 RB1 .5 CRCL 36 (SUTURE) IMPLANT
SUT SILK  1 MH (SUTURE) ×3
SUT SILK 1 MH (SUTURE) ×3 IMPLANT
SUT SILK 2 0 SH CR/8 (SUTURE) ×2 IMPLANT
SUT SILK 2 0SH CR/8 30 (SUTURE) IMPLANT
SUT SILK 3 0 SH CR/8 (SUTURE) ×2 IMPLANT
SUT VIC AB 1 CTX 18 (SUTURE) ×4 IMPLANT
SUT VIC AB 1 CTX 36 (SUTURE)
SUT VIC AB 1 CTX36XBRD ANBCTR (SUTURE) IMPLANT
SUT VIC AB 2-0 CTX 36 (SUTURE) ×4 IMPLANT
SUT VIC AB 2-0 UR6 27 (SUTURE) ×2 IMPLANT
SUT VIC AB 3-0 SH 27 (SUTURE) ×1
SUT VIC AB 3-0 SH 27X BRD (SUTURE) ×1 IMPLANT
SUT VIC AB 3-0 SH 8-18 (SUTURE) ×4 IMPLANT
SUT VIC AB 3-0 X1 27 (SUTURE) ×2 IMPLANT
SUT VICRYL 2 TP 1 (SUTURE) ×2 IMPLANT
SYSTEM SAHARA CHEST DRAIN RE-I (WOUND CARE) ×2 IMPLANT
TAPE CLOTH 4X10 WHT NS (GAUZE/BANDAGES/DRESSINGS) ×2 IMPLANT
TIP APPLICATOR SPRAY EXTEND 16 (VASCULAR PRODUCTS) IMPLANT
TOWEL OR 17X24 6PK STRL BLUE (TOWEL DISPOSABLE) ×4 IMPLANT
TOWEL OR 17X26 10 PK STRL BLUE (TOWEL DISPOSABLE) ×4 IMPLANT
TRAY FOLEY CATH 14FRSI W/METER (CATHETERS) ×2 IMPLANT
TUNNELER SHEATH ON-Q 11GX8 DSP (PAIN MANAGEMENT) ×2 IMPLANT
WATER STERILE IRR 1000ML POUR (IV SOLUTION) ×4 IMPLANT

## 2013-11-29 NOTE — Progress Notes (Signed)
Pt's O2 sat continues to drop to 85% while sleeping despite being on 6 L O2 via nasal cannula. Pt place on venti mask 45% 10 L O2. O2 sat in low 90s. Will continue to monitor.  Vella Raring, RN

## 2013-11-29 NOTE — Transfer of Care (Signed)
Immediate Anesthesia Transfer of Care Note  Patient: [name]  Procedure(s) Performed: Procedure(s): Video assisted thoracoscopy for wedge resection; mini thoracotomy (Left)  Patient Location: PACU  Anesthesia Type:General  Level of Consciousness: awake, alert  and oriented  Airway & Oxygen Therapy: Patient Spontanous Breathing and Patient connected to face mask oxygen  Post-op Assessment: Report given to PACU RN, Post -op Vital signs reviewed and stable and Patient moving all extremities X 4  Post vital signs: Reviewed and stable  Complications: No apparent anesthesia complications

## 2013-11-29 NOTE — Op Note (Signed)
CARDIOTHORACIC SURGERY OPERATIVE NOTE  Date of Procedure:   11/29/2013  Preoperative Diagnosis:  Left Lung Nodule  Postoperative Diagnosis:  Non-small Cell Carcinoma Left Lung  Procedure:     Left Video-assisted Thoracoscopy for Wedge Resection Left Upper Lobe   Surgeon:    Valentina Gu. Roxy Manns, MD  Assistant:    John Giovanni, PA-C  Anesthesia:    Arabella Merles, MD  Operative Findings:   Severe COPD with moderate pleural adhesions, primarily surrounding left lower lobe  Small mass peripherally located in left upper lobe  Frozen section histology c/w non-small cell carcinoma  All surgical margins free of tumor     BRIEF CLINICAL NOTE AND INDICATIONS FOR SURGERY   Patient is a 68 year old white male with complex past medical history referred for possible surgical resection of lung nodule in the left upper lobe suspicious for possible primary lung cancer. The patient has a long-standing history of tobacco abuse, COPD, hypertension, nonischemic cardiomyopathy, permanent atrial fibrillation, and chronic combined systolic and diastolic congestive heart failure. In 2010 he underwent minimally invasive mitral valve replacement using a porcine bioprosthetic tissue valve and autologous pericardial patch closure of a secundum type atrial septal defect. At the time he had bacterial endocarditis complicated by severe mitral regurgitation and congestive heart failure with history of previous embolic stroke. He did not undergo Maze procedure at the time because he had previously been treated with AV node ablation. His left atrial appendage was over-sewn at the time of surgery. He recovered from his surgery uneventfully and has done well from a cardiac standpoint until recently. In February of this year he presented with a brief episode of chest discomfort and shortness of breath associated with hypoxemia. He was hospitalized and treated for possible pneumonia, COPD exacerbation and acute  exacerbation of chronic combined systolic and diastolic congestive heart failure. He ruled out for acute myocardial infarction. Left heart catheterization was not performed. An echocardiogram was performed demonstrating stable chronic left ventricular dysfunction with ejection fraction estimated 30-35%. There was some suggestion of slightly increased gradient across the patient's bioprosthetic tissue valve in the mitral position, although there was no significant mitral regurgitation nor any sign of perivalvular leak. Right heart catheterization was performed, demonstrating significant pulmonary hypertension. Transesophageal echocardiogram was suggested but not performed. CT angiogram of the chest performed at the time of admission revealed a small spiculated nodule in the left upper lobe suspicious for primary lung cancer. The patient was seen in followup for this nodule recently by Dr. Elsworth Soho who subsequently obtained a PET-CT scan that confirmed the presence of the nodule seen on CT, which was noted to have increased metabolic activity (max SUV 3.6) further raising the suspicion of primary lung cancer. There was no sign of metastatic disease in the mediastinum or elsewhere in the body. The patient was referred for surgical consultation.  The patient has been seen in consultation and counseled at length regarding the indications, risks and potential benefits of surgery.  All questions have been answered, and the patient provides full informed consent for the operation as described.      DETAILS OF THE OPERATIVE PROCEDURE  Prior to surgery the patient is existing pacemaker defibrillator is turned to continuous pacemaker with the defibrillator mode turned off.  The patient is brought to the operating room on the above mentioned date and placed in the supine position on the operating table. Central venous line and radial arterial line are placed by the anesthesia team. Intravenous antibiotics are administered  and pneumatic sequential compression boots are placed on both lower extremities. General endotracheal anesthesia is induced uneventfully.  The patient was intubated using a dual lumen endotracheal tube. The patient is turned to the right lateral decubitus position.  An axillary roll is placed and the patient's left chest was prepared and draped in a sterile manner.  A time out procedure is performed.  A small incision is made in the left midaxillary line overlying the seventh intercostal space. The left pleural space is entered bluntly. A 10 mm thoracoscopic port is placed through the incision. The left pleural space was explored visually using a thoracoscopic camera. There are moderate pleural adhesions located primarily surrounding the left lower lobe. A miniature anterior thoracotomy incision is performed overlying the fourth intercostal space. A soft tissue retractor is placed and rib spreading is completely avoided. Many of the pleural adhesions are divided with electrocautery to facilitate mobilization of the left upper lobe. There are no gross abnormalities identified with exception of anatomical findings consistent with severe COPD. There are large thin-walled blebs and areas of emphysema throughout the left upper lobe. No other gross anatomical abnormalities are identified.  The left upper lobe is mobilized to facilitate palpation. A firm nodule is easily located close to the apex anteriorly consistent with the nodule seen on preoperative radiographic imaging studies. Wedge resection is performed using endoscopic GIA stapling device with Peri-Guard strips to buttress the staple lines. A generous wedge resection is performed given the fact that the patient is felt not to be candidate for completion lobectomy. The specimen is removed and sent to pathology for frozen section histology.  The mediastinum was explored. No pathologic lymphadenopathy is identified. A lymph node specimen from the AP window is  removed and sent for permanent histology labeled as level V lymph node. Hemostasis was ascertained.  An On-Q catheter is placed through separate stab incision posteriorly and positioned in the subpleural space posteriorly to cover the nerve roots from the second through the sixth intercostal spaces.    Frozen section histology reports the primary nodule consistent with non-small cell carcinoma, favor subtype adenocarcinoma. All surgical margins were free of tumor.  The pleural space is irrigated with copious warm saline solution. The pleural space is drained using a single 65 French chest tube exited through the original port incision. The mini thoracotomy incision is closed in multiple layers in routine fashion. The chest tube was fixed to close suction drainage device.  The patient tolerated the procedure well, was extubated in the operative room, and transported to the postanesthesia care in stable condition. All sponge and instrument counts are verified correct. The needle count was incorrect. A chest x-ray was obtained in the operating room prior to completion of the procedure verifying no retained needle.  There were no intraoperative complications. Estimated blood loss was trivial.    Valentina Gu. Roxy Manns MD 11/29/2013 9:44 AM

## 2013-11-29 NOTE — Progress Notes (Signed)
[facility] BRIEF PROGRESS NOTE  Day of Surgery  S/P Procedure(s) (LRB): Video assisted thoracoscopy for wedge resection; mini thoracotomy (Left)   Awake and alert in PACU, c/o soreness in left chest Breathing comfortably on O2 via facemask No air leak, minimal chest tube output pCXR w/ increased CHF  Plan: Will give 1 dose lasix.  Needs defibrillator turned back on.  Monitor in SICU  [name] 11/29/2013 11:15 AM

## 2013-11-29 NOTE — Anesthesia Postprocedure Evaluation (Signed)
  Anesthesia Post-op Note  Patient: [name]  Procedure(s) Performed: Procedure(s): Video assisted thoracoscopy for wedge resection; mini thoracotomy (Left)  Patient Location: PACU  Anesthesia Type:General  Level of Consciousness: awake and alert   Airway and Oxygen Therapy: Patient Spontanous Breathing  Post-op Pain: mild  Post-op Assessment: Post-op Vital signs reviewed, Patient's Cardiovascular Status Stable and Respiratory Function Stable  Post-op Vital Signs: Reviewed  Filed Vitals:   11/29/13 1115  BP:   Pulse: 70  Temp:   Resp: 17    Complications: No apparent anesthesia complications

## 2013-11-29 NOTE — Anesthesia Preprocedure Evaluation (Addendum)
Anesthesia Evaluation  Patient identified by MRN, date of birth, ID band Patient awake    Reviewed: Allergy & Precautions, H&P , NPO status , Patient's Chart, lab work & pertinent test results, reviewed documented beta blocker date and time   Airway Mallampati: I TM Distance: >3 FB Neck ROM: Full    Dental no notable dental hx. (+) Teeth Intact, Dental Advisory Given   Pulmonary shortness of breath and with exertion, neg sleep apnea, COPD COPD inhaler, neg recent URI, former smoker,  breath sounds clear to auscultation  Pulmonary exam normal       Cardiovascular hypertension, Pt. on medications and On Home Beta Blockers - angina+ Past MI, + CABG, + Peripheral Vascular Disease and +CHF + dysrhythmias Atrial Fibrillation + pacemaker + Cardiac Defibrillator Rhythm:Regular Rate:Normal  EF 30-35%, quit stress due to short of breath and leg fatigue, deemed mod to high cardiac risk, St Jude biV pacing ICD, no shocks or pacing, VVIR   Neuro/Psych  Headaches, CVA, No Residual Symptoms negative psych ROS   GI/Hepatic negative GI ROS, Neg liver ROS, GERD-  Medicated and Controlled,  Endo/Other  diabetes, Well Controlled, Type 2  Renal/GU negative Renal ROS  negative genitourinary   Musculoskeletal   Abdominal   Peds  Hematology negative hematology ROS (+)   Anesthesia Other Findings   Reproductive/Obstetrics negative OB ROS                         Anesthesia Physical Anesthesia Plan  ASA: IV  Anesthesia Plan: General   Post-op Pain Management:    Induction: Intravenous  Airway Management Planned: Double Lumen EBT  Additional Equipment: Arterial line, CVP and Ultrasound Guidance Line Placement  Intra-op Plan:   Post-operative Plan: Extubation in OR and Possible Post-op intubation/ventilation  Informed Consent: I have reviewed the patients History and Physical, chart, labs and discussed the  procedure including the risks, benefits and alternatives for the proposed anesthesia with the patient or authorized representative who has indicated his/her understanding and acceptance.   Dental advisory given  Plan Discussed with: CRNA and Surgeon  Anesthesia Plan Comments:        Anesthesia Quick Evaluation

## 2013-11-29 NOTE — Brief Op Note (Addendum)
11/29/2013  9:42 AM  PATIENT:  [name]  68 y.o. male  PRE-OPERATIVE DIAGNOSIS:  Left lung mass  POST-OPERATIVE DIAGNOSIS:  Left lung mass  PROCEDURE:  Procedure(s): Video assisted thoracoscopy for wedge resection; mini thoracotomy (Left)  SURGEON:  Surgeon(s) and Role:    * [name], MD - Primary  PHYSICIAN ASSISTANT: John Giovanni, PA-C  ASSISTANTS: Genie Bantel   ANESTHESIA:   general  EBL:  Total I/O In: 1200 [I.V.:1200] Out: 650 [Urine:550; Blood:100]  BLOOD ADMINISTERED:none  DRAINS: single 32 French Chest Tube(s) in the left pleural space   LOCAL MEDICATIONS USED:  BUPIVICAINE   SPECIMEN:  Source of Specimen:  Wedge Resection Left Upper Lobe  DISPOSITION OF SPECIMEN:  PATHOLOGY  COUNTS:  INCORRECT  TOURNIQUET:  * No tourniquets in log *  DICTATION: .Note written in EPIC  PLAN OF CARE: Admit to inpatient   PATIENT DISPOSITION:  PACU - hemodynamically stable.   Delay start of Pharmacological VTE agent (>24hrs) due to surgical blood loss or risk of bleeding: yes  [name] 11/29/2013 9:44 AM

## 2013-11-29 NOTE — Progress Notes (Signed)
[facility] BRIEF SICU PROGRESS NOTE  Day of Surgery  S/P Procedure(s) (LRB): Video assisted thoracoscopy for wedge resection; mini thoracotomy (Left)   Resting comfortably Good analgesia Breathing comfortably w/ O2 sats 93% on 3 L/min via [place] UOP adequate Chest tube output low, no air leak  Plan: Continue routine care  [name] 11/29/2013 8:13 PM

## 2013-11-29 NOTE — Progress Notes (Signed)
Utilization Review Completed.Charles Laming T Dowell5/22/2015

## 2013-11-29 NOTE — H&P (Signed)
[address]       [place] [address]             [phone]          CARDIOTHORACIC SURGERY HISTORY AND PHYSICAL EXAM  Referring Provider is Rigoberto Noel, MD PCP is Joycelyn Man, MD    Chief Complaint   Patient presents with   .  Lung Lesion       Surgical eval on  left upper lobe Pulmonary nodule, Chest CT 08/13/13, PET Scan 10/29/13     HPI:  Patient is a 68 year old white male with complex past medical history referred for possible surgical resection of lung nodule in the left upper lobe suspicious for possible primary lung cancer.  The patient has a long-standing history of tobacco abuse, COPD, hypertension, nonischemic cardiomyopathy, permanent atrial fibrillation, and chronic combined systolic and diastolic congestive heart failure.  In 2010 he underwent minimally invasive mitral valve replacement using a porcine bioprosthetic tissue valve and autologous pericardial patch closure of a secundum type atrial septal defect.  At the time he had bacterial endocarditis complicated by severe mitral regurgitation and congestive heart failure with history of previous embolic stroke.  He did not undergo Maze procedure at the time because he had previously been treated with AV node ablation.  His left atrial appendage was over-sewn at the time of surgery.  He recovered from his surgery uneventfully and has done well from a cardiac standpoint until recently.  In February of this year he presented with a brief episode of chest discomfort and shortness of breath associated with hypoxemia.  He was hospitalized and treated for possible pneumonia, COPD exacerbation and acute exacerbation of chronic combined systolic and diastolic congestive heart failure. He ruled out for acute myocardial infarction.  Left heart catheterization was not performed.  An echocardiogram was performed demonstrating stable chronic left ventricular dysfunction with ejection fraction estimated 30-35%.  There was some suggestion of slightly increased gradient across the patient's bioprosthetic tissue valve in the mitral position, although there was no significant mitral regurgitation nor any sign of perivalvular leak.  Right heart catheterization was performed, demonstrating significant pulmonary hypertension.  Transesophageal echocardiogram was suggested but not performed.  CT angiogram of the chest performed at the time of admission revealed a small spiculated nodule in the left upper lobe suspicious for primary lung cancer. The patient was seen in followup for this nodule recently by Dr. Elsworth Soho who subsequently obtained a PET-CT scan that confirmed the presence of the nodule seen on CT, which was noted to have increased metabolic activity (max SUV 3.6) further raising the suspicion of primary lung cancer. There was no sign of metastatic disease in the mediastinum or elsewhere in the body.  The patient was referred for surgical consultation. He was originally seen in consultation on 11/18/2013. This past week the patient underwent formal cardioplasty stress testing and brain CT scan. He returns to the office today to review the results of the test and discuss treatment options further. He reports no new problems or complaints over the past week.  The patient lives at home with his wife. He complains that he has been told not to perform any sort of strenuous activity since he left the hospital.  He claims that he feels well and that he thinks he could be doing more than he has been recently.  He does admit to some degree of exertional shortness of breath, although he states that this is  really minimal. He denies any recent problems with exertional shortness of breath or chest discomfort since his most recent hospital discharge. He reports no PND, orthopnea, or lower extremity edema.  He has an intermittent dry cough. He denies any productive cough, hemoptysis, wheezing. He denies any dizzy spells,  tachypalpitations, or syncope.  He was discharged from the hospital on home oxygen therapy, but according to the patient and his wife the patient's oxygen saturations have been stable in the mid 90s off oxygen at rest, with a tendency to decrease to the 80s with exertion.  He states that he just quit smoking this past February.     Past Medical History  Diagnosis Date  . Hypertension   . Endocarditis     Bacterial, 2009  . Colon polyps   . Dyslipidemia   . Atrial fibrillation     AV Node ablation January, 2010, for rapid atrial fib  . Cardiomyopathy     non-ischemic  . Ejection fraction < 50%   . Atrial septal defect     Closed with surgery January, 2010  . Mitral regurgitation     Severe symptomatic primary MR due to bacterial endocarditis, treated w/ MVR  . Intracranial hemorrhage     Coumadin cannot be used because of the history of his bleed  . Renal artery stenosis     Mild by history  . Pulmonary hypertension     Moderate  . HLD (hyperlipidemia)   . Automatic implantable cardioverter-defibrillator in situ     LV dysfunction and pacer needed for AV node lesion  . CVA (cerebral vascular accident) 2009    denies residual on 08/14/2013  . CHF (congestive heart failure)   . Myocardial infarction 2010  . Dysrhythmia   . Pre-diabetes   . Pneumonia     "this is my first case" (08/14/2013)  . Spontaneous pneumothorax     right thoracotomy - distant past  . Permanent atrial fibrillation     Originally Coumadin use for atrial fibrillation  //   he had intracerebral hemorrhage with an INR of 2.3 June, 2009. Anticoagulation could no longer be used.  //  Rapid atrial fibrillation after inferior MI October, 2010..........Marland Kitchen AV node ablation done at that time with ICD pacemaker placed (EF 35%).   //   Left atrial appendage tied off at the time of mitral valve surgery January, 2010 (maze pro  . Status post minimally invasive mitral valve replacement with bioprosthetic valve     33 mm  Medtronic Mosaic porcine bioprosthesis placed via right mini thoracotomy for bacterial endocarditis complicated by severe MR and CHF   . Prosthetic valve dysfunction     Mild mitral stenosis  . Chronic combined systolic and diastolic CHF (congestive heart failure)   . Pacemaker   . Diabetes mellitus without complication   . Headache(784.0)     related to stroke only  . Cancer     lung- per pt.   Marland Kitchen COPD (chronic obstructive pulmonary disease)     O2- 2 liters, nasal cannula, q night   . COPD GOLD II 01/11/2007    PFT's 09/25/13  FEV1  2.18 (61%) ratio 64 no change p B2 and DLC0  32% corrects to 68% - trial off advair and acei rec starting  08/23/2013       Past Surgical History  Procedure Laterality Date  . Mitral valve replacement Right 07/17/2008    23mm Medtronic Mosaic porcine bioprosthesis  . Cardiac defibrillator placement  ~ 2010  [employer] Jude  . Penile prosthesis implant    . Appendectomy    . Mass biopsy Left     neck mass  . Leep    . Cataract extraction w/ intraocular lens  implant, bilateral Bilateral   . Asd repair, secundum  07/17/2008    pericardial patch closure of ASD  . Av node ablation  07/2008    for rapid atrial fib  . Thoracotomy Right 1970's    spontaneous pneumothorax - while in the [employer]  . Tonsillectomy    . Insert / replace / remove pacemaker    . Cardiac valve replacement    . Cardiac catheterization      Family History  Problem Relation Age of Onset  . Stomach cancer Father   . Cancer Father   . Stroke      family history  . Stroke Mother     Social History History  Substance Use Topics  . Smoking status: Former Smoker -- 1.00 packs/day for 45 years    Types: Cigarettes  . Smokeless tobacco: Never Used     Comment: 08/14/2013 "quit smoking in 2009"  . Alcohol Use: Yes     Comment: 08/14/2013 "used to drink beer; quit:in 1982"    Prior to Admission medications   Medication Sig Start Date End Date Taking? Authorizing Provider  amoxicillin  (AMOXIL) 500 MG capsule Take 2,000 mg by mouth See admin instructions. 1 hour prior to dental procedures   Yes Historical Provider, MD  aspirin EC 81 MG tablet Take 1 tablet (81 mg total) by mouth daily. 12/07/12  Yes Carlena Bjornstad, MD  atorvastatin (LIPITOR) 20 MG tablet Take 20 mg by mouth at bedtime. 08/05/13  Yes Dorena Cookey, MD  budesonide-formoterol [facility]) 160-4.5 MCG/ACT inhaler Inhale 2 puffs into the lungs 2 (two) times daily.   Yes Historical Provider, MD  carvedilol (COREG) 3.125 MG tablet Take 3.125 mg by mouth 2 (two) times daily with a meal.   Yes Historical Provider, MD  furosemide (LASIX) 40 MG tablet Take 40 mg by mouth daily. May take 1 extra daily as needed for edema 08/05/13  Yes Dorena Cookey, MD  glucose blood (ACCU-CHEK AVIVA PLUS) test strip Test once daily, dx 250.00 11/04/13  Yes Dorena Cookey, MD  irbesartan (AVAPRO) 75 MG tablet Take 1 tablet (75 mg total) by mouth daily. 09/11/13  Yes Tanda Rockers, MD  isosorbide dinitrate (ISORDIL) 10 MG tablet Take 10 mg by mouth 3 (three) times daily.   Yes Historical Provider, MD  Lancets (BD LANCET ULTRAFINE 30G) MISC 1 each by Does not apply route daily. 11/19/13  Yes Dorena Cookey, MD  metFORMIN (GLUCOPHAGE) 500 MG tablet Take 500 mg by mouth daily with breakfast.   Yes Historical Provider, MD  ACCU-CHEK SOFTCLIX LANCETS lancets Test once daily, dx 250.00 11/04/13   Dorena Cookey, MD  acetaminophen (TYLENOL) 500 MG tablet Take 500 mg by mouth every 6 (six) hours as needed for mild pain.     Historical Provider, MD  acetaminophen-codeine (TYLENOL #3) 300-30 MG per tablet Take 1-2 tablets by mouth every 4 (four) hours as needed. 11/08/13   Ellison Carwin, MD  nitroGLYCERIN (NITROSTAT) 0.4 MG SL tablet Place 1 tablet (0.4 mg total) under the tongue every 5 (five) minutes as needed. For chest pain 08/05/13   Dorena Cookey, MD    Allergies  Allergen Reactions  . Anticoagulant Compound     Pt had intracranial bleed, therefore  all anticoagulation is contra-indicated per Dr. Ron Parker.  . Warfarin Sodium      Review of Systems:              General:                      normal appetite, normal energy, no weight gain, no weight loss, no fever             Cardiac:                      no chest pain with exertion, no chest pain at rest, perhaps mild SOB with exertion, no resting SOB, no PND, no orthopnea, no palpitations, + arrhythmia, + atrial fibrillation, no LE edema, no dizzy spells, no syncope             Respiratory:                + mild shortness of breath, + home oxygen, no productive cough, + intermittent dry cough, no bronchitis, no wheezing, no hemoptysis, no asthma, no pain with inspiration or cough, no sleep apnea, no CPAP at night             GI:                                no difficulty swallowing, no reflux, no frequent heartburn, no hiatal hernia, no abdominal pain, no constipation, no diarrhea, no hematochezia, no hematemesis, no melena             GU:                              no dysuria,  no frequency, no urinary tract infection, no hematuria, no enlarged prostate, no kidney stones, no kidney disease             Vascular:                     no pain suggestive of claudication, no pain in feet, no leg cramps, no varicose veins, no DVT, no non-healing foot ulcer             Neuro:                         + stroke, no TIA's, no seizures, no headaches, no temporary blindness one eye,  no slurred speech, no peripheral neuropathy, no chronic pain, no instability of gait, no memory/cognitive dysfunction             Musculoskeletal:         no arthritis, no joint swelling, no myalgias, no difficulty walking, normal mobility               Skin:                            no rash, no itching, no skin infections, no pressure sores or ulcerations             Psych:                         no anxiety, no depression, no nervousness, no unusual recent stress             Eyes:  no blurry vision,  no floaters, no recent vision changes, + wears glasses or contacts             ENT:                            + hearing loss, no loose or painful teeth, no dentures, last saw dentist December 2014 - gets antibiotic prophylaxis             Hematologic:               no easy bruising, no abnormal bleeding, no clotting disorder, no frequent epistaxis             Endocrine:                   + diabetes, checks CBG's at home                           Physical Exam:              BP 78/51  Pulse 84  Resp 20  Ht 6\' 2"  (1.88 m)  Wt 208 lb (94.348 kg)  BMI 26.69 kg/m2  SpO2 94%             General:                        well-appearing             HEENT:                       Unremarkable               Neck:                           no JVD, no bruits, no adenopathy               Chest:                         clear to auscultation, symmetrical breath sounds, no wheezes, no rhonchi               CV:                              RRR, no murmur               Abdomen:                    soft, non-tender, no masses               Extremities:                 warm, well-perfused, pulses palpable, no LE edema, + chronic varicose veins             Rectal/GU                   Deferred             Neuro:                         Grossly non-focal and symmetrical throughout  Skin:                            Clean and dry, no rashes, no breakdown   Diagnostic Tests:  Transthoracic Echocardiography  Patient: [name], [name] MR #: [id number] Study Date: 08/13/2013 Gender: M Age: 65 Height: 188cm Weight: 96.6kg BSA: 2.40m^2 Pt. Status: Room: 2W27C  ADMITTING Dola Argyle Celene Squibb ATTENDING Eleonore Chiquito SONOGRAPHER Donata Clay PERFORMING [facility], Inpatient cc:  ------------------------------------------------------------ LV EF: 30% - 35%  ------------------------------------------------------------ Indications: Shortness of breath  786.05.  ------------------------------------------------------------ History: PMH: Atrial Septal Defect. Hypoxia. Pulmonary Hypertension. AV Nodal Ablasion. Endocarditis.Ischemic Cardiomyopathy Atrial fibrillation. Coronary artery disease. Congestive heart failure. Chronic obstructive pulmonary disease.  ------------------------------------------------------------ Study Conclusions  - Left ventricle: There is akinesis of the apex, apical anterior, septal, inferior walls and hypokinesis of the mid and basal inferior and inferolateral walls. The cavity size was mildly dilated. There was mild concentric hypertrophy. Systolic function was moderately to severely reduced. The estimated ejection fraction was in the range of 30% to 35%. - Aortic root: Mildly dilated aortic root measuring 44 mm. - Ascending aorta: The ascending aorta was normal in size. - Mitral valve: There is a bioprosthetic mitral valve, that is well seated. There is no mitral regurgitation and no paravalvular leak. Transmitral gradients areelevated when compared to the prior study fromDember 17,2014(average mean gradient 70mmHg, previosuly6 mmHg). Mobility cannot be assessed on current study.A TEE or fluoroscopy should be considered to rule out leaflet thrombosis. Valve area by continuity equation (using LVOT flow): 0.87cm^2. - Left atrium: The atrium was severely dilated. - Right ventricle: The cavity size was mildly dilated. Systolic function was moderately reduced. - Right atrium: The atrium was moderately dilated. - Atrial septum: No defect or patent foramen ovale was identified. - Impressions: When compared to the prior study from 06/26/2013 the LV cavity is now mildly dilated. Transmitral gradient across bioprosthetic valve are elevated when compared to the prior study. Leaflet motion is not well visualized. If clinically indicated afluoroscopy or TEE is recommended. Impressions:  - When compared to the  prior study from 06/26/2013 the LV cavity is now mildly dilated. Transmitral gradient across bioprosthetic valve are elevated when compared to the prior study. Leaflet motion is not well visualized. If clinically indicated afluoroscopy or TEE is recommended.  ------------------------------------------------------------ Labs, prior tests, procedures, and surgery: Permanent pacemaker system implantation.  Echocardiography. M-mode, complete 2D, spectral Doppler, and color Doppler. Height: Height: 188cm. Height: 74in. Weight: Weight: 96.6kg. Weight: 212.6lb. Body mass index: BMI: 27.3kg/m^2. Body surface area: BSA: 2.34m^2. Blood pressure: 111/72. Patient status: Inpatient. Location: Bedside.  ------------------------------------------------------------  ------------------------------------------------------------ Left ventricle: There is akinesis of the apex, apical anterior, septal, inferior walls and hypokinesis of the mid and basal inferior and inferolateral walls. The cavity size was mildly dilated. There was mild concentric hypertrophy. Systolic function was moderately to severely reduced. The estimated ejection fraction was in the range of 30% to 35%.  ------------------------------------------------------------ Aortic valve: Structurally normal valve. Cusp separation was normal. Doppler: Transvalvular velocity was within the normal range. There was no stenosis. No regurgitation.  ------------------------------------------------------------ Aorta: Aortic root: Mildly dilated aortic root measuring 44 mm. Ascending aorta: The ascending aorta was normal in size.  ------------------------------------------------------------ Mitral valve: There is a bioprosthetic mitral valve, that is well seated. There is no mitral regurgitation and no paravalvular leak. Transmitral gradients areelevated when compared to the prior study fromDember 17,2014(average mean gradient  49mmHg, previosuly6 mmHg). Mobility cannot be assessed  on current study.A TEE or fluoroscopy should be considered to rule out leaflet thrombosis. Doppler: Valve area by continuity equation (using LVOT flow): 0.87cm^2. Indexed valve area by continuity equation (using LVOT flow): 0.39cm^2/m^2. Mean gradient: 38mm Hg (D). Peak gradient: 66mm Hg (D).  ------------------------------------------------------------ Left atrium: The atrium was severely dilated.  ------------------------------------------------------------ Atrial septum: No defect or patent foramen ovale was identified.  ------------------------------------------------------------ Right ventricle: The cavity size was mildly dilated. Pacer wire or catheter noted in right ventricle. Systolic function was moderately reduced.  ------------------------------------------------------------ Right atrium: The atrium was moderately dilated.  ------------------------------------------------------------ Pericardium: There is mild posteriorly located pericardial effusin without evidence of hemodynamic compromise.  ------------------------------------------------------------  2D measurements Normal Doppler measurements Normal Left ventricle LVOT LVID ED, 55.9 mm 43-52 Peak vel, 55.5 cm/s ------ chord, S PLAX VTI, S 8.63 cm ------ LVID ES, 46.6 mm 23-38 Mitral valve chord, Mean vel, 120 cm/s ------ PLAX D FS, chord, 17 % >29 Mean 8 mm Hg ------ PLAX gradient, LVPW, ED 11.4 mm ------ D IVS/LVPW 0.71 <1.3 Peak 9 mm Hg ------ ratio, ED gradient, Vol ED, 164 ml ------ D MOD1 Area 0.87 cm^2 ------ Vol ES, 116 ml ------ (LVOT) MOD1 continuit EF, MOD1 29 % ------ y Vol index, 74 ml/m^2 ------ Area 0.39 cm^2/m^ ------ ED, MOD1 index 2 Vol index, 52 ml/m^2 ------ (LVOT ES, MOD1 cont) Vol ED, 153 ml ------ Annulus 57.1 cm ------ MOD2 VTI Vol ES, 96 ml ------ Systemic veins MOD2 Estimated 3 mm Hg ------ EF, MOD2 37 % ------  CVP Stroke 57 ml ------ vol, MOD2 Vol index, 69 ml/m^2 ------ ED, MOD2 Vol index, 43 ml/m^2 ------ ES, MOD2 Stroke 25.6 ml/m^2 ------ index, MOD2 Ventricular septum IVS, ED 8.14 mm ------ LVOT Diam, S 27 mm ------ Area 5.73 cm^2 ------ Left atrium AP dim 65 mm ------ AP dim 2.91 cm/m^2 <2.2 index  ------------------------------------------------------------ Prepared and Electronically Authenticated by  Ena Dawley, M.D. [id number]:[id number]    CT CHEST WITH CONTRAST   TECHNIQUE: Multidetector CT imaging of the chest was performed during intravenous contrast administration.   CONTRAST:  34mL OMNIPAQUE IOHEXOL 300 MG/ML  SOLN   COMPARISON:  DG CHEST 2 VIEW dated 08/12/2013; DG CHEST 2 VIEW dated 02/06/2012; CT ABD W/CM dated 06/07/2008   No prior chest CTs available.   FINDINGS: Right subclavian pacemaker leads extend into the right ventricle and coronary sinus. There is moderate cardiomegaly with asymmetric enlargement of the right cardiac chambers and the left atrium. There is central enlargement of the pulmonary arteries. No acute pulmonary emboli are demonstrated. There is relatively mild atherosclerosis of the aorta, great vessels and coronary arteries.   There is a partially loculated pleural effusion on the right which extends into the fissures. There is a small dependent pleural effusion on the left. There are pleural calcifications bilaterally without demonstrated pleural-based mass. There is no significant pericardial effusion.   Numerous small mediastinal and hilar lymph nodes are not pathologically enlarged. There is no axillary adenopathy or chest wall abnormality. Asymmetric gynecomastia is noted on the left.   Advanced emphysematous changes are present in both lungs with biapical bulla. There is atelectasis at both lung bases adjacent to the pleural effusions. In the left upper lobe, there is a somewhat spiculated nodule measuring up to  1.5 cm transverse on image 16. No other focal nodules are identified. There is no endobronchial lesion.   The visualized upper abdomen appears stable without suspicious findings. There are no worrisome osseous findings.   IMPRESSION: 1.  Spiculated left upper lobe nodule is morphologically concerning for possible bronchogenic carcinoma. Given the patient's multiple other medical problems, consider chest CT follow-up in 4- 6 months to assess stability. PET-CT could be performed if clinically warranted. 2. Cardiomegaly with central enlargement of the pulmonary arteries consistent with pulmonary arterial hypertension. 3. Bilateral pleural effusions, partially loculated on the right. There is mild adjacent atelectasis, but no focal airspace disease. There underlying pleural calcifications bilaterally, but no evidence of pleural-based mass. 4. Emphysema and atherosclerosis noted.     Electronically Signed   By: Camie Patience M.D.   On: 08/13/2013 14:53     NUCLEAR MEDICINE PET SKULL BASE TO THIGH   TECHNIQUE: 10.3 mCi F-18 FDG was injected intravenously. Full-ring PET imaging was performed from the skull base to thigh after the radiotracer. CT data was obtained and used for attenuation correction and anatomic localization.   FASTING BLOOD GLUCOSE:  Value: 161 mg/dl   COMPARISON:  Chest CT 08/13/2013.   FINDINGS: NECK   No hypermetabolic lymph nodes in the neck.   CHEST   Image 20 of series 6 demonstrates a 1.6 x 1.1 cm macrolobulated lesion with slightly spiculated margins in the apex of the left upper lobe, abutting the pleura, which is mildly hypermetabolic (SUVmax = 3.6). No other definite suspicious appearing pulmonary nodules or masses are identified. There is a background of mild to moderate centrilobular and paraseptal emphysema, most pronounced throughout the lung apices bilaterally. Compared to the prior examination, the previously noted bilateral pleural  effusions have resolved. Multifocal areas of mild pleural thickening are again noted in the thorax bilaterally, some of which appear partially calcified, without a frank plaque-like appearance. Possible suture line in the periphery of the right middle lobe, suggesting prior wedge resection. No hypermetabolic mediastinal or hilar nodes. Heart size is markedly enlarged with biatrial dilatation. Curvilinear hypoattenuation throughout the apex of the left ventricle, presumably fibrofatty remodeling related to prior apical infarction. There is no significant pericardial fluid, thickening or pericardial calcification. There is atherosclerosis of the thoracic aorta, the great vessels of the mediastinum and the coronary arteries, including calcified atherosclerotic plaque in the left anterior descending, left circumflex and right coronary arteries. Right-sided biventricular pacemaker/AICD with lead tips terminating in the right ventricular apex and overlying the lateral wall of the left ventricle via the coronary sinus and coronary veins.   ABDOMEN/PELVIS   No abnormal hypermetabolic activity within the liver, pancreas, adrenal glands, or spleen. No hypermetabolic lymph nodes in the abdomen or pelvis. Low-attenuation lesions in the kidneys bilaterally are incompletely characterized on today's non contrast CT examination, but favored to represent cysts, largest of which measures up to 3.5 cm in diameter in the posterior aspect of the interpolar region of the right kidney. Atherosclerosis throughout the abdominal and pelvic vasculature, without evidence of aneurysm. No significant volume of ascites. No pneumoperitoneum. No pathologic distention of small bowel. Penile prosthesis incidentally noted.   SKELETON   No focal hypermetabolic activity to suggest skeletal metastasis.   IMPRESSION: 1. 1.6 x 1.1 cm macrolobulated pleural-based nodule with spiculated margins and hypermetabolism (SUVmax  = 3.6) in the apex of the left upper lobe, concerning for primary bronchogenic neoplasm. No associated mediastinal or hilar lymphadenopathy, and no definite signs of metastatic disease on today's examination. These findings suggest T1a, N0, M0 disease (i.e., likely stage IA disease). Correlation with biopsy is suggested to establish a tissue diagnosis. 2. Resolution of previously noted bilateral pleural effusions. 3. Mild to moderate paraseptal and centrilobular emphysema  most pronounced in the lung apices. 4. Cardiomegaly with biatrial dilatation. 5. Atherosclerosis, including 3 vessel coronary artery disease. Please note that although the presence of coronary artery calcium documents the presence of coronary artery disease, the severity of this disease and any potential stenosis cannot be assessed on this non-gated CT examination. Assessment for potential risk factor modification, dietary therapy or pharmacologic therapy may be warranted, if clinically indicated. 6. Additional incidental findings, as above.     Electronically Signed   By: Vinnie Langton M.D.   On: 10/29/2013 10:58      Pulmonary Function Tests  Baseline                                                                      Post-bronchodilator  FVC                 3.40 L  (71% predicted)          FVC                 3.65 L  (77% predicted) FEV1               2.18 L  (61% predicted)          FEV1               2.37 L  (67% predicted) FEF25-75        1.17 L  (42% predicted)          FEF25-75        1.50 L  (54% predicted)  TLC                 5.61 L  (77% predicted) RV                   2.03 L  (83% predicted) DLCO              32% predicted    CARDIOPULMONARY EXERCISE TEST  Referred for: Chronic respiratory failure/pre-operative evaluation  Procedure: This patient underwent staged symptom-limited exercise treadmill testing using a modified Naughton protocol with expired gas analysis metabolic  evaluation during exercise.  Demographics  Age: 30 Ht. (in.) 81 Wt. (lb) 203 BMI: 26.1 Predicted Peak VO2: 29 ml/kg/min  Gender: Male Ht (cm) 188 Wt. (kg) 92.1    Results  Pre-Exercise PFTs   FVC 3.70 (70%)   FEV1 2.28 (58%)   FEV1/FVC 62%   MVV 105 (71%)  Exercise Time: 6:00 Speed (mph): 2.0 Grade (%): 3.5    RPE: 13  Reason stopped: Patient stopped due to leg fatigue (8/10)  Additional symptoms: None reported  Resting HR: 83 Peak HR: 98 (64% age predicted max HR)  BP rest: 100/58 BP peak: 106/60  Peak VO2: 12.7 (43.7% predicted peak VO2)  VE/VCO2 slope: 40.5  OUES: 1.61  Peak RER: 0.95  Ventilatory Threshold: Could not be determined  Peak RR 31  Peak Ventilation: 49.2  VE/MVV: 46.7%  PETCO2 at peak: 29  O2pulse: 12 (71% predicted O2pulse)   Interpretation  Notes: Patient gave a good effort. Pulse-oximetry was 96% at rest and dropped steadily throughout the exercise to 90% at peak exercise.  ECG: Resting ECG with complete heart block and RV pacing. There  was an inadequate HR response to the exercise with continued complete heart block and RV pacing throughout the exercise. The BP response was relatively unchanging throughout the exercise.   PFT: Resting spirometry demonstrates a moderate obstructive pattern. The MVV was mildly reduced.  CPX: Evaluation of this exercise test with gas exchange is limited due to the sub-maximal aerobic effort demonstrated by the RER of 0.95. The peak VO2 was measured to be 12.7 ml/kg/min (44% of the age/gender/weight matched sedentary norms). The VE/VCO2 slope is significantly elevated indicating increased dead space ventilation and poor ventilatory efficiency. The oxygen uptake efficiency slope (OUES) is moderately reduced and suggestive of a higher potential functional capacity than was measured (supports sub-maximal exercise). The VO2 at the ventilatory threshold could not be determined. At peak  exercise, the ventilation reached 47% of the measured MVV indicating ventilatory reserve remained (respiratory rate was below the expected range, Vt/IC [55%] was below the expected range, PETCO2 was below normal). The O2pulse (a surrogate for stroke volume) increased slowly throughout the exercise.   Conclusion: Interpretation of the exercise test with gas exchange is limited due to the submaximal aerobic effort. Based on available data, there is a moderate-to-severe functional limitation due primarily to a circulatory limitation. There is significant chronotropic incompetence. Given the patient's test results he is felt to be at moderate to high risk for peri-operative cardiac complications.   Daniel R Bensimhon,MD 12:14 AM    CT HEAD WITHOUT AND WITH CONTRAST  TECHNIQUE:   Contiguous axial images were obtained from the base of the skull   through the vertex without and with intravenous contrast   CONTRAST: 80mL OMNIPAQUE IOHEXOL 300 MG/ML SOLN   COMPARISON: 06/07/2010   FINDINGS:   No evidence for acute infarction, hemorrhage, mass lesion,   hydrocephalus, or extra-axial fluid. Mild atrophy. Mild small vessel   disease. Bilateral chronic cerebral infarcts affect the left   posterior temporal and right parietal regions, stable from 2011. No   vasogenic edema. No abnormal postcontrast enhancement. No proximal   vascular occlusion. Prominent cortical veins. Negative orbits. No   acute sinus or mastoid fluid.   IMPRESSION:   No intracranial metastatic disease. Chronic changes as described. No   osseous lesions are evident. Similar appearance to 2011.   Electronically Signed   By: Rolla Flatten M.D.   On: 11/22/2013 14:08    Impression:  The patient has a small nodule located peripherally in the left upper lobe that is hypermetabolic on PET imaging and highly suspicious for primary lung cancer, clinical stage I (T1-2,N0,M0). 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 He is pacemaker-dependent, having undergone AV node ablation for treatment of atrial fibrillation in 2009. He has been very stable from a cardiac standpoint until this past February when he was hospitalized for a brief episode of chest discomfort and shortness of breath associated with hypoxemia. He ruled out for acute myocardial infarction at that time. He has numerous risk factors for the presence of ischemic heart disease, but cardiac catheterization performed in 2009 was notable for the absence of any significant coronary artery disease. He has had 2 previous thoracotomies in the past, including a large right posterolateral thoracotomy more than 30 years ago for spontaneous pneumothorax and more recent right miniature anterior thoracotomy for mitral valve replacement and closure of atrial septal defect in 2010. Fortunately, the suspicious lung lesion  is on the left side. Spirometry demonstrates the presence of moderate obstructive disease, but his diffusion capacity is severely reduced. The patient quit smoking 3 months ago. Risks associated with surgery will unquestionably elevated but are likely not neccessarily prohibitive. Because of the severity of underlying chronic lung disease with combination of chronic diastolic congestive heart failure the patient would not be considered candidate for lobectomy. However, the left lung nodule is peripherally located and potentially suitable for wedge resection. SBRT could be considered as an alternative to high risk surgical resection if tissue diagnosis of malignancy could be ascertained using TTNA or ENB for biopsy.     Plan:  I discussed options at length with the patient and his wife in the office today.  We discussed the risks associated with  surgical resection including particular concerns regarding the possibility of postoperative respiratory failure. We contrasted surgical therapy for wedge resection with an alternative strategy of SBRT.  The patient desires to proceed with surgery as soon as practical. He understands and accepts all potential associated risks including but not limited to risk of death, stroke, myocardial infarction, respiratory failure, congestive heart failure, bleeding requiring blood transfusion, arrhythmia, prolonged chest wall pain, prolonged air leak requiring chest tube drainage, or late recurrence of malignancy. All of his questions been addressed.  We plan to proceed with surgery on Friday, 11/29/2013. The patient has been instructed to continue all medications without change through the night before surgery. On the morning of surgery the patient will take only carvedilol with a sip of water.    Valentina Gu. Roxy Manns, MD

## 2013-11-29 NOTE — Anesthesia Procedure Notes (Addendum)
Procedure Name: Intubation Date/Time: 11/29/2013 8:05 AM Performed by: Raphael Gibney T Pre-anesthesia Checklist: Patient identified, Timeout performed, Emergency Drugs available, Suction available and Patient being monitored Patient Re-evaluated:Patient Re-evaluated prior to inductionOxygen Delivery Method: Circle system utilized and Simple face mask Preoxygenation: Pre-oxygenation with 100% oxygen Intubation Type: IV induction Ventilation: Mask ventilation without difficulty Laryngoscope Size: Miller and 3 Grade View: Grade I Endobronchial tube: Left, Double lumen EBT, EBT position confirmed by fiberoptic bronchoscope and EBT position confirmed by auscultation and 41 Fr Number of attempts: 1 Airway Equipment and Method: Patient positioned with wedge pillow and Stylet Placement Confirmation: ETT inserted through vocal cords under direct vision,  positive ETCO2 and breath sounds checked- equal and bilateral Tube secured with: Tape Dental Injury: Teeth and Oropharynx as per pre-operative assessment     Right IJ CVL placed under US guidance ASA monitors, Time out, Sterile prep and drape, Anatomy Identified with [place], Vascular access obtained, CVL placed over wire via Seldinger technique All ports aspirated and free flowing Sutured and secured.

## 2013-11-30 ENCOUNTER — Inpatient Hospital Stay (HOSPITAL_COMMUNITY): Payer: Medicare Other

## 2013-11-30 DIAGNOSIS — J9383 Other pneumothorax: Secondary | ICD-10-CM | POA: Diagnosis not present

## 2013-11-30 DIAGNOSIS — Z4682 Encounter for fitting and adjustment of non-vascular catheter: Secondary | ICD-10-CM | POA: Diagnosis not present

## 2013-11-30 DIAGNOSIS — J984 Other disorders of lung: Secondary | ICD-10-CM | POA: Diagnosis not present

## 2013-11-30 LAB — POCT I-STAT 3, ART BLOOD GAS (G3+)
Acid-Base Excess: 6 mmol/L — ABNORMAL HIGH (ref 0.0–2.0)
BICARBONATE: 31.7 meq/L — AB (ref 20.0–24.0)
O2 SAT: 89 %
PCO2 ART: 49.2 mmHg — AB (ref 35.0–45.0)
PO2 ART: 56 mmHg — AB (ref 80.0–100.0)
Patient temperature: 97.4
TCO2: 33 mmol/L (ref 0–100)
pH, Arterial: 7.414 (ref 7.350–7.450)

## 2013-11-30 LAB — GLUCOSE, CAPILLARY
GLUCOSE-CAPILLARY: 105 mg/dL — AB (ref 70–99)
GLUCOSE-CAPILLARY: 142 mg/dL — AB (ref 70–99)
GLUCOSE-CAPILLARY: 146 mg/dL — AB (ref 70–99)
Glucose-Capillary: 166 mg/dL — ABNORMAL HIGH (ref 70–99)
Glucose-Capillary: 237 mg/dL — ABNORMAL HIGH (ref 70–99)
Glucose-Capillary: 242 mg/dL — ABNORMAL HIGH (ref 70–99)

## 2013-11-30 LAB — CBC
HCT: 43 % (ref 39.0–52.0)
Hemoglobin: 13.9 g/dL (ref 13.0–17.0)
MCH: 31.1 pg (ref 26.0–34.0)
MCHC: 32.3 g/dL (ref 30.0–36.0)
MCV: 96.2 fL (ref 78.0–100.0)
Platelets: 126 10*3/uL — ABNORMAL LOW (ref 150–400)
RBC: 4.47 MIL/uL (ref 4.22–5.81)
RDW: 16 % — ABNORMAL HIGH (ref 11.5–15.5)
WBC: 10.8 10*3/uL — AB (ref 4.0–10.5)

## 2013-11-30 LAB — BASIC METABOLIC PANEL
BUN: 20 mg/dL (ref 6–23)
CHLORIDE: 97 meq/L (ref 96–112)
CO2: 29 mEq/L (ref 19–32)
Calcium: 8.7 mg/dL (ref 8.4–10.5)
Creatinine, Ser: 0.93 mg/dL (ref 0.50–1.35)
GFR calc non Af Amer: 86 mL/min — ABNORMAL LOW (ref >90)
Glucose, Bld: 156 mg/dL — ABNORMAL HIGH (ref 70–99)
POTASSIUM: 4.2 meq/L (ref 3.7–5.3)
Sodium: 136 mEq/L — ABNORMAL LOW (ref 137–147)

## 2013-11-30 MED ORDER — FUROSEMIDE 40 MG PO TABS
40.0000 mg | ORAL_TABLET | Freq: Every day | ORAL | Status: DC
  Administered 2013-11-30 – 2013-12-02 (×3): 40 mg via ORAL
  Filled 2013-11-30 (×4): qty 1

## 2013-11-30 MED ORDER — METFORMIN HCL 500 MG PO TABS
500.0000 mg | ORAL_TABLET | Freq: Every day | ORAL | Status: DC
  Administered 2013-12-01 – 2013-12-02 (×2): 500 mg via ORAL
  Filled 2013-11-30 (×5): qty 1

## 2013-11-30 MED ORDER — INSULIN ASPART 100 UNIT/ML ~~LOC~~ SOLN
0.0000 [IU] | SUBCUTANEOUS | Status: DC
  Administered 2013-11-30 (×2): 8 [IU] via SUBCUTANEOUS
  Administered 2013-11-30: 2 [IU] via SUBCUTANEOUS
  Administered 2013-12-01 (×2): 4 [IU] via SUBCUTANEOUS
  Administered 2013-12-01: 8 [IU] via SUBCUTANEOUS
  Administered 2013-12-01: 4 [IU] via SUBCUTANEOUS
  Administered 2013-12-01 – 2013-12-02 (×4): 2 [IU] via SUBCUTANEOUS
  Administered 2013-12-02 (×3): 4 [IU] via SUBCUTANEOUS
  Administered 2013-12-03 (×2): 2 [IU] via SUBCUTANEOUS

## 2013-11-30 NOTE — Progress Notes (Signed)
[facility] BRIEF SICU PROGRESS NOTE  1 Day Post-Op  S/P Procedure(s) (LRB): Video assisted thoracoscopy for wedge resection; mini thoracotomy (Left)   Stable day  PORTABLE CHEST - 1 VIEW  COMPARISON: the previous day's study  FINDINGS:  Left chest tube has retracted slightly, tip still above the level of  the aortic arch. Pleural line projects over the posterior aspect  left third rib as before consistent with small residual apical  pneumothorax. Staple line in the left suprahilar region with some  adjacent parenchymal opacification, stable. Right IJ central line  stable. Right subclavian AICD stable. Moderate cardiomegaly stable.  Coarse interstitial and alveolar opacities bilaterally left greater  than right involving bases more than apices as before. No definite  effusion. .  Degenerative changes in the left shoulder.  IMPRESSION:  1. Slight retraction of left chest tube with stable small apical  pneumothorax.  2. Little change in asymmetric pulmonary opacities left greater than  right.  3. Stable cardiomegaly.  Electronically Signed  By: Arne Cleveland M.D.  On: 11/30/2013 10:15   Plan: Continue current plan  [name] 11/30/2013 6:19 PM

## 2013-11-30 NOTE — Progress Notes (Addendum)
      [address]       [place] [address]             [phone]     CARDIOTHORACIC SURGERY PROGRESS NOTE  1 Day Post-Op  S/P Procedure(s) (LRB): Video assisted thoracoscopy for wedge resection; mini thoracotomy (Left)  Subjective: Looks good.  Mild soreness in chest.  Denies SOB.  Just ate breakfast.  Objective: Vital signs in last 24 hours: Temp:  [97.3 F (36.3 C)-98.7 F (37.1 C)] 97.7 F (36.5 C) (05/23 0838) Pulse Rate:  [50-77] 75 (05/23 0900) Cardiac Rhythm:  [-] Ventricular paced (05/23 0800) Resp:  [12-23] 21 (05/23 0900) BP: (83-127)/(54-74) 98/63 mmHg (05/23 0900) SpO2:  [86 %-98 %] 90 % (05/23 0900) Arterial Line BP: (94-145)/(38-66) 104/50 mmHg (05/23 0900) FiO2 (%):  [45 %-55 %] 55 % (05/23 0200) Weight:  [92 kg (202 lb 13.2 oz)] 92 kg (202 lb 13.2 oz) (05/23 0500)  Physical Exam:  Rhythm:   AV paced  Breath sounds: clear  Heart sounds:  RRR  Incisions:  Dressing dry, intact  Abdomen:  Soft, non-distended, non-tender  Extremities:  Warm, well-perfused  Chest tube:  Trivial output, no air leak   Intake/Output from previous day: 05/22 0701 - 05/23 0700 In: 2810 [P.O.:360; I.V.:2350; IV Piggyback:100] Out: 3090 [Urine:2690; Blood:100; Chest Tube:300] Intake/Output this shift: Total I/O In: 300 [P.O.:200; I.V.:100] Out: 40 [Urine:40]  Lab Results:  Recent Labs  11/28/13 1001 11/30/13 0439  WBC 7.4 10.8*  HGB 15.2 13.9  HCT 45.3 43.0  PLT 143* 126*   BMET:  Recent Labs  11/28/13 1001 11/30/13 0439  NA 138 136*  K 4.7 4.2  CL 101 97  CO2 21 29  GLUCOSE 174* 156*  BUN 22 20  CREATININE 0.84 0.93  CALCIUM 9.6 8.7    CBG (last 3)   Recent Labs  11/30/13 0031 11/30/13 0426 11/30/13 0836  GLUCAP 142* 146* 166*   PT/INR:   Recent Labs  11/28/13 1001  LABPROT 13.9  INR 1.09    CXR:  Not done  Assessment/Plan: S/P Procedure(s) (LRB): Video assisted thoracoscopy for wedge resection; mini thoracotomy  (Left)  Overall looks very good POD1 although routine CXR has not been done Mobilize D/C a-line D/C IV fluids D/C foley Restart home dose Metformin and continue CBG's and SSI ac/hs Transfer step down if CXR looks okay  [name] 11/30/2013 9:42 AM

## 2013-12-01 ENCOUNTER — Encounter (HOSPITAL_COMMUNITY): Payer: Self-pay | Admitting: *Deleted

## 2013-12-01 ENCOUNTER — Inpatient Hospital Stay (HOSPITAL_COMMUNITY): Payer: Medicare Other

## 2013-12-01 DIAGNOSIS — J90 Pleural effusion, not elsewhere classified: Secondary | ICD-10-CM | POA: Diagnosis not present

## 2013-12-01 LAB — GLUCOSE, CAPILLARY
GLUCOSE-CAPILLARY: 176 mg/dL — AB (ref 70–99)
GLUCOSE-CAPILLARY: 214 mg/dL — AB (ref 70–99)
Glucose-Capillary: 126 mg/dL — ABNORMAL HIGH (ref 70–99)
Glucose-Capillary: 149 mg/dL — ABNORMAL HIGH (ref 70–99)
Glucose-Capillary: 167 mg/dL — ABNORMAL HIGH (ref 70–99)
Glucose-Capillary: 171 mg/dL — ABNORMAL HIGH (ref 70–99)

## 2013-12-01 LAB — COMPREHENSIVE METABOLIC PANEL
ALBUMIN: 3.1 g/dL — AB (ref 3.5–5.2)
ALT: 10 U/L (ref 0–53)
AST: 16 U/L (ref 0–37)
Alkaline Phosphatase: 52 U/L (ref 39–117)
BUN: 25 mg/dL — ABNORMAL HIGH (ref 6–23)
CALCIUM: 8.6 mg/dL (ref 8.4–10.5)
CO2: 30 meq/L (ref 19–32)
Chloride: 95 mEq/L — ABNORMAL LOW (ref 96–112)
Creatinine, Ser: 1.05 mg/dL (ref 0.50–1.35)
GFR calc Af Amer: 83 mL/min — ABNORMAL LOW (ref >90)
GFR, EST NON AFRICAN AMERICAN: 72 mL/min — AB (ref >90)
Glucose, Bld: 175 mg/dL — ABNORMAL HIGH (ref 70–99)
Potassium: 4 mEq/L (ref 3.7–5.3)
Sodium: 135 mEq/L — ABNORMAL LOW (ref 137–147)
Total Bilirubin: 2.6 mg/dL — ABNORMAL HIGH (ref 0.3–1.2)
Total Protein: 6 g/dL (ref 6.0–8.3)

## 2013-12-01 LAB — CBC
HCT: 40.2 % (ref 39.0–52.0)
Hemoglobin: 13 g/dL (ref 13.0–17.0)
MCH: 30.9 pg (ref 26.0–34.0)
MCHC: 32.3 g/dL (ref 30.0–36.0)
MCV: 95.5 fL (ref 78.0–100.0)
Platelets: DECREASED 10*3/uL (ref 150–400)
RBC: 4.21 MIL/uL — AB (ref 4.22–5.81)
RDW: 16.4 % — ABNORMAL HIGH (ref 11.5–15.5)
WBC: 8.3 10*3/uL (ref 4.0–10.5)

## 2013-12-01 MED ORDER — ALBUTEROL SULFATE (2.5 MG/3ML) 0.083% IN NEBU
2.5000 mg | INHALATION_SOLUTION | Freq: Two times a day (BID) | RESPIRATORY_TRACT | Status: DC
  Administered 2013-12-01 – 2013-12-03 (×4): 2.5 mg via RESPIRATORY_TRACT
  Filled 2013-12-01 (×4): qty 3

## 2013-12-01 MED ORDER — ALBUTEROL SULFATE (2.5 MG/3ML) 0.083% IN NEBU: 2.5000 mg | INHALATION_SOLUTION | RESPIRATORY_TRACT | Status: DC | PRN

## 2013-12-01 NOTE — Progress Notes (Addendum)
      [address]       [place] [address]             [phone]        CARDIOTHORACIC SURGERY PROGRESS NOTE   R2 Days Post-Op Procedure(s) (LRB): Video assisted thoracoscopy for wedge resection; mini thoracotomy (Left)  Subjective: Feels good.  Still w/ some soreness in chest.  Had to undergo I+O cath overnight due to urinary retention  Objective: Vital signs: BP Readings from Last 1 Encounters:  12/01/13 113/62   Pulse Readings from Last 1 Encounters:  12/01/13 74   Resp Readings from Last 1 Encounters:  12/01/13 15   Temp Readings from Last 1 Encounters:  12/01/13 97.9 F (36.6 C) Oral    Hemodynamics:    Physical Exam:  Rhythm:   AV paced  Breath sounds: clear  Heart sounds:  RRR  Incisions:  Dressing dry, intact  Abdomen:  soft  Extremities:  Warm  Chest tube:  Low volume output, no air leak   Intake/Output from previous day: 05/23 0701 - 05/24 0700 In: 920 [P.O.:820; I.V.:100] Out: 1195 [Urine:1045; Chest Tube:150] Intake/Output this shift:    Lab Results:  CBC: Recent Labs  11/30/13 0439 12/01/13 0400  WBC 10.8* 8.3  HGB 13.9 13.0  HCT 43.0 40.2  PLT 126* PLATELET CLUMPS NOTED ON SMEAR, COUNT APPEARS DECREASED    BMET:  Recent Labs  11/30/13 0439 12/01/13 0400  NA 136* 135*  K 4.2 4.0  CL 97 95*  CO2 29 30  GLUCOSE 156* 175*  BUN 20 25*  CREATININE 0.93 1.05  CALCIUM 8.7 8.6     CBG (last 3)   Recent Labs  11/30/13 1953 11/30/13 2353 12/01/13 0400  GLUCAP 242* 126* 171*    ABG    Component Value Date/Time   PHART 7.414 11/30/2013 0439   PCO2ART 49.2* 11/30/2013 0439   PO2ART 56.0* 11/30/2013 0439   HCO3 31.7* 11/30/2013 0439   TCO2 33 11/30/2013 0439   O2SAT 89.0 11/30/2013 0439    CXR: stable  Assessment/Plan: S/P Procedure(s) (LRB): Video assisted thoracoscopy for wedge resection; mini thoracotomy (Left)  Overall progressing well POD2 CBG's up yesterday but pharmacy refused to let RN give  metformin Chest tube to water seal Mobilize I+O cath as needed Transfer step down  [name] 12/01/2013 8:03 AM

## 2013-12-02 ENCOUNTER — Inpatient Hospital Stay (HOSPITAL_COMMUNITY): Payer: Medicare Other

## 2013-12-02 DIAGNOSIS — Z4682 Encounter for fitting and adjustment of non-vascular catheter: Secondary | ICD-10-CM | POA: Diagnosis not present

## 2013-12-02 DIAGNOSIS — J984 Other disorders of lung: Secondary | ICD-10-CM | POA: Diagnosis not present

## 2013-12-02 DIAGNOSIS — J9383 Other pneumothorax: Secondary | ICD-10-CM | POA: Diagnosis not present

## 2013-12-02 DIAGNOSIS — R0602 Shortness of breath: Secondary | ICD-10-CM | POA: Diagnosis not present

## 2013-12-02 LAB — GLUCOSE, CAPILLARY
GLUCOSE-CAPILLARY: 175 mg/dL — AB (ref 70–99)
GLUCOSE-CAPILLARY: 175 mg/dL — AB (ref 70–99)
Glucose-Capillary: 126 mg/dL — ABNORMAL HIGH (ref 70–99)
Glucose-Capillary: 142 mg/dL — ABNORMAL HIGH (ref 70–99)
Glucose-Capillary: 147 mg/dL — ABNORMAL HIGH (ref 70–99)
Glucose-Capillary: 177 mg/dL — ABNORMAL HIGH (ref 70–99)

## 2013-12-02 NOTE — Plan of Care (Signed)
Problem: Phase II Progression Outcomes Goal: Weaning O2 for sats > or equal to 88% Outcome: Progressing Weaning O2

## 2013-12-02 NOTE — Progress Notes (Signed)
      [address]       [place] [address]             [phone]      CARDIOTHORACIC SURGERY PROGRESS NOTE  3 Days Post-Op  S/P Procedure(s) (LRB): Video assisted thoracoscopy for wedge resection; mini thoracotomy (Left)  Subjective: No complaints.   Slept well.  Objective: Vital signs in last 24 hours: Temp:  [97.4 F (36.3 C)-98.7 F (37.1 C)] 98.3 F (36.8 C) (05/25 0400) Pulse Rate:  [70-78] 77 (05/25 0600) Cardiac Rhythm:  [-] Ventricular paced (05/25 0400) Resp:  [14-23] 14 (05/25 0600) BP: (69-127)/(29-72) 127/70 mmHg (05/25 0600) SpO2:  [87 %-99 %] 96 % (05/25 0600) Weight:  [94.8 kg (208 lb 15.9 oz)] 94.8 kg (208 lb 15.9 oz) (05/25 0500)  Physical Exam:  Rhythm:   AV paced  Breath sounds: clear  Heart sounds:  RRR  Incisions:  Clean and dry  Abdomen:  soft  Extremities:  warm  Chest tube(s):  No air leak.  Low volume output.   Intake/Output from previous day: 05/24 0701 - 05/25 0700 In: 1220 [P.O.:1220] Out: 3338 [Urine:3350; Chest Tube:100] Intake/Output this shift:    Lab Results:  Recent Labs  11/30/13 0439 12/01/13 0400  WBC 10.8* 8.3  HGB 13.9 13.0  HCT 43.0 40.2  PLT 126* PLATELET CLUMPS NOTED ON SMEAR, COUNT APPEARS DECREASED   BMET:  Recent Labs  11/30/13 0439 12/01/13 0400  NA 136* 135*  K 4.2 4.0  CL 97 95*  CO2 29 30  GLUCOSE 156* 175*  BUN 20 25*  CREATININE 0.93 1.05  CALCIUM 8.7 8.6    CBG (last 3)   Recent Labs  12/02/13 0005 12/02/13 0348 12/02/13 0738  GLUCAP 175* 126* 142*    CXR:  stable  Assessment/Plan: S/P Procedure(s) (LRB): Video assisted thoracoscopy for wedge resection; mini thoracotomy (Left)  Doing well Final path pending D/C chest tube Transfer floor Tentatively plan d/c home tomorrow  [name] 12/02/2013 8:18 AM

## 2013-12-03 ENCOUNTER — Inpatient Hospital Stay (HOSPITAL_COMMUNITY): Payer: Medicare Other

## 2013-12-03 ENCOUNTER — Encounter (HOSPITAL_COMMUNITY): Payer: Self-pay | Admitting: Thoracic Surgery (Cardiothoracic Vascular Surgery)

## 2013-12-03 DIAGNOSIS — J9383 Other pneumothorax: Secondary | ICD-10-CM | POA: Diagnosis not present

## 2013-12-03 LAB — GLUCOSE, CAPILLARY
GLUCOSE-CAPILLARY: 125 mg/dL — AB (ref 70–99)
Glucose-Capillary: 159 mg/dL — ABNORMAL HIGH (ref 70–99)
Glucose-Capillary: 225 mg/dL — ABNORMAL HIGH (ref 70–99)

## 2013-12-03 MED ORDER — OXYCODONE HCL 5 MG PO TABS: 5.0000 mg | ORAL_TABLET | Freq: Four times a day (QID) | ORAL | Status: DC | PRN

## 2013-12-03 NOTE — Discharge Summary (Signed)
[address]       [place] [address]             [phone]      Physician Discharge Summary  Patient ID: [name] MRN: [id number] DOB/AGE: 67-Nov-1947 67 y.o.  Admit date: 11/29/2013 Discharge date: 12/03/2013  Admission Diagnoses:Left upper lobe lung nodule (final pathology currently pending. Frozen section consistent with non-small cell carcinoma, favor adenocarcinoma)  Discharge Diagnoses:  Principal Problem:   Lung cancer Active Problems:   COPD Everrett Lacasse II   Hypertension   Permanent atrial fibrillation   ICD (implantable cardioverter-defibrillator), biventricular, in situ   Renal artery stenosis   Pulmonary hypertension   Chronic systolic CHF (congestive heart failure)   Status post minimally invasive mitral valve replacement with bioprosthetic valve   Chronic respiratory failure assoc with chf/ PAH   H/O atrioventricular nodal ablation   Solitary pulmonary nodule   Cardiomyopathy, nonischemic   Diabetes   Prosthetic valve dysfunction   Chronic combined systolic and diastolic CHF (congestive heart failure)   Nodule of left lung   COPD (chronic obstructive pulmonary disease)   Lung nodule   Discharged Condition: good  History of the present illness: Patient is a 68 year old white male with complex past medical history referred for possible surgical resection of lung nodule in the left upper lobe suspicious for possible primary lung cancer. The patient has a long-standing history of tobacco abuse, COPD, hypertension, nonischemic cardiomyopathy, permanent atrial fibrillation, and chronic combined systolic and diastolic congestive heart failure. In 2010 he underwent minimally invasive mitral valve replacement using a porcine bioprosthetic tissue valve and autologous pericardial patch closure of a secundum type atrial septal defect. At the time he had bacterial endocarditis complicated by severe mitral regurgitation and congestive heart failure  with history of previous embolic stroke. He did not undergo Maze procedure at the time because he had previously been treated with AV node ablation. His left atrial appendage was over-sewn at the time of surgery. He recovered from his surgery uneventfully and has done well from a cardiac standpoint until recently. In February of this year he presented with a brief episode of chest discomfort and shortness of breath associated with hypoxemia. He was hospitalized and treated for possible pneumonia, COPD exacerbation and acute exacerbation of chronic combined systolic and diastolic congestive heart failure. He ruled out for acute myocardial infarction. Left heart catheterization was not performed. An echocardiogram was performed demonstrating stable chronic left ventricular dysfunction with ejection fraction estimated 30-35%. There was some suggestion of slightly increased gradient across the patient's bioprosthetic tissue valve in the mitral position, although there was no significant mitral regurgitation nor any sign of perivalvular leak. Right heart catheterization was performed, demonstrating significant pulmonary hypertension. Transesophageal echocardiogram was suggested but not performed. CT angiogram of the chest performed at the time of admission revealed a small spiculated nodule in the left upper lobe suspicious for primary lung cancer. The patient was seen in followup for this nodule recently by Dr. Elsworth Soho who subsequently obtained a PET-CT scan that confirmed the presence of the nodule seen on CT, which was noted to have increased metabolic activity (max SUV 3.6) further raising the suspicion of primary lung cancer. There was no sign of metastatic disease in the mediastinum or elsewhere in the body. The patient was referred for surgical consultation. He was originally seen in consultation on 11/18/2013. This past week the patient underwent formal cardioplasty stress testing and brain CT scan. He returns to  the office today to review the results of the test and discuss treatment options further. He reports no new problems or complaints over the past week.  The patient lives at home with his wife. He complains that he has been told not to perform any sort of strenuous activity since he left the hospital. He claims that he feels well and that he thinks he could be doing more than he has been recently. He does admit to some degree of exertional shortness of breath, although he states that this is really minimal. He denies any recent problems with exertional shortness of breath or chest discomfort since his most recent hospital discharge. He reports no PND, orthopnea, or lower extremity edema. He has an intermittent dry cough. He denies any productive cough, hemoptysis, wheezing. He denies any dizzy spells, tachypalpitations, or syncope. He was discharged from the hospital on home oxygen therapy, but according to the patient and his wife the patient's oxygen saturations have been stable in the mid 90s off oxygen at rest, with a tendency to decrease to the 80s with exertion. He states that he just quit smoking this past February.  Dr. Darylene Price saw patient in cardiothoracic surgical consultation. After evaluating the patient and his studies he recommended left thoracoscopic surgery for resection.  Past Medical History   Diagnosis  Date   .  Hypertension    .  Endocarditis      Bacterial, 2009   .  Colon polyps    .  Dyslipidemia    .  Atrial fibrillation      AV Node ablation January, 2010, for rapid atrial fib   .  Cardiomyopathy      non-ischemic   .  Ejection fraction < 50%    .  Atrial septal defect      Closed with surgery January, 2010   .  Mitral regurgitation      Severe symptomatic primary MR due to bacterial endocarditis, treated w/ MVR   .  Intracranial hemorrhage      Coumadin cannot be used because of the history of his bleed   .  Renal artery stenosis      Mild by history   .   Pulmonary hypertension      Moderate   .  HLD (hyperlipidemia)    .  Automatic implantable cardioverter-defibrillator in situ      LV dysfunction and pacer needed for AV node lesion   .  CVA (cerebral vascular accident)  2009     denies residual on 08/14/2013   .  CHF (congestive heart failure)    .  Myocardial infarction  2010   .  Dysrhythmia    .  Pre-diabetes    .  Pneumonia      "this is my first case" (08/14/2013)   .  Spontaneous pneumothorax      right thoracotomy - distant past   .  Permanent atrial fibrillation      Originally Coumadin use for atrial fibrillation // he had intracerebral hemorrhage with an INR of 2.3 June, 2009. Anticoagulation could no longer be used. // Rapid atrial fibrillation after inferior MI October, 2010..........Marland Kitchen AV node ablation done at that time with ICD pacemaker placed (EF 35%). // Left atrial appendage tied off at the time of mitral valve surgery January, 2010 (maze pro   .  Status post minimally invasive mitral valve replacement with bioprosthetic valve      33 mm Medtronic Mosaic porcine bioprosthesis placed via  right mini thoracotomy for bacterial endocarditis complicated by severe MR and CHF   .  Prosthetic valve dysfunction      Mild mitral stenosis   .  Chronic combined systolic and diastolic CHF (congestive heart failure)    .  Pacemaker    .  Diabetes mellitus without complication    .  Headache(784.0)      related to stroke only   .  Cancer      lung- per pt.   Marland Kitchen  COPD (chronic obstructive pulmonary disease)      O2- 2 liters, nasal cannula, q night   .  COPD Tanelle Lanzo II  01/11/2007     PFT's 09/25/13 FEV1 2.18 (61%) ratio 64 no change p B2 and DLC0 32% corrects to 68% - trial off advair and acei rec starting 08/23/2013    Past Surgical History   Procedure  Laterality  Date   .  Mitral valve replacement  Right  07/17/2008     7mm Medtronic Mosaic porcine bioprosthesis   .  Cardiac defibrillator placement   ~ 2010     St Jude   .  Penile  prosthesis implant     .  Appendectomy     .  Mass biopsy  Left      neck mass   .  Leep     .  Cataract extraction w/ intraocular lens implant, bilateral  Bilateral    .  Asd repair, secundum   07/17/2008     pericardial patch closure of ASD   .  Av node ablation   07/2008     for rapid atrial fib   .  Thoracotomy  Right  1970's     spontaneous pneumothorax - while in the [employer]   .  Tonsillectomy     .  Insert / replace / remove pacemaker     .  Cardiac valve replacement     .  Cardiac catheterization      Family History   Problem  Relation  Age of Onset   .  Stomach cancer  Father    .  Cancer  Father    .  Stroke       family history   .  Stroke  Mother     Social History  History   Substance Use Topics   .  Smoking status:  Former Smoker -- 1.00 packs/day for 45 years     Types:  Cigarettes   .  Smokeless tobacco:  Never Used      Comment: 08/14/2013 "quit smoking in 2009"   .  Alcohol Use:  Yes      Comment: 08/14/2013 "used to drink beer; quit:in 1982"    Prior to Admission medications   Medication  Sig  Start Date  End Date  Taking?  Authorizing Provider   amoxicillin (AMOXIL) 500 MG capsule  Take 2,000 mg by mouth See admin instructions. 1 hour prior to dental procedures    Yes  Historical Provider, MD   aspirin EC 81 MG tablet  Take 1 tablet (81 mg total) by mouth daily.  12/07/12   Yes  Carlena Bjornstad, MD   atorvastatin (LIPITOR) 20 MG tablet  Take 20 mg by mouth at bedtime.  08/05/13   Yes  Dorena Cookey, MD   budesonide-formoterol [facility]) 160-4.5 MCG/ACT inhaler  Inhale 2 puffs into the lungs 2 (two) times daily.    Yes  Historical Provider, MD   carvedilol (  COREG) 3.125 MG tablet  Take 3.125 mg by mouth 2 (two) times daily with a meal.    Yes  Historical Provider, MD   furosemide (LASIX) 40 MG tablet  Take 40 mg by mouth daily. May take 1 extra daily as needed for edema  08/05/13   Yes  Dorena Cookey, MD   glucose blood (ACCU-CHEK AVIVA PLUS) test strip  Test  once daily, dx 250.00  11/04/13   Yes  Dorena Cookey, MD   irbesartan (AVAPRO) 75 MG tablet  Take 1 tablet (75 mg total) by mouth daily.  09/11/13   Yes  Tanda Rockers, MD   isosorbide dinitrate (ISORDIL) 10 MG tablet  Take 10 mg by mouth 3 (three) times daily.    Yes  Historical Provider, MD   Lancets (BD LANCET ULTRAFINE 30G) MISC  1 each by Does not apply route daily.  11/19/13   Yes  Dorena Cookey, MD   metFORMIN (GLUCOPHAGE) 500 MG tablet  Take 500 mg by mouth daily with breakfast.    Yes  Historical Provider, MD   ACCU-CHEK SOFTCLIX LANCETS lancets  Test once daily, dx 250.00  11/04/13    Dorena Cookey, MD   acetaminophen (TYLENOL) 500 MG tablet  Take 500 mg by mouth every 6 (six) hours as needed for mild pain.     Historical Provider, MD   acetaminophen-codeine (TYLENOL #3) 300-30 MG per tablet  Take 1-2 tablets by mouth every 4 (four) hours as needed.  11/08/13    Ellison Carwin, MD   nitroGLYCERIN (NITROSTAT) 0.4 MG SL tablet  Place 1 tablet (0.4 mg total) under the tongue every 5 (five) minutes as needed. For chest pain  08/05/13    Dorena Cookey, MD    Allergies   Allergen  Reactions   .  Anticoagulant Compound      Pt had intracranial bleed, therefore all anticoagulation is contra-indicated per Dr. Ron Parker.   .  Warfarin Sodium       Hospital Course: the patient was admitted electively and on 11/29/2013 taken to the operating room at which time he underwent the following procedure: CARDIOTHORACIC SURGERY OPERATIVE NOTE  Date of Procedure: 11/29/2013  Preoperative Diagnosis: Left Lung Nodule  Postoperative Diagnosis: Non-small Cell Carcinoma Left Lung  Procedure: Left Video-assisted Thoracoscopy for Wedge Resection Left Upper Lobe  Surgeon: Valentina Gu. Roxy Manns, MD  Assistant: John Giovanni, PA-C  Anesthesia: Arabella Merles, MD  Operative Findings:  Severe COPD with moderate pleural adhesions, primarily surrounding left lower lobe  Small mass peripherally located in left upper  lobe  Frozen section histology c/w non-small cell carcinoma  All surgical margins free of tumor  The patient tolerated the procedure well, was extubated in the operative room, and transported to the postanesthesia care in stable condition. All sponge and instrument counts are verified correct. The needle count was incorrect. A chest x-ray was obtained in the operating room prior to completion of the procedure verifying no retained needle. There were no intraoperative complications. Estimated blood loss was trivial.  Postoperative hospital course:  The patient has overall progressed quite nicely. Final pathology is currently pending. He has remained hemodynamically stable. His pacemaker is functioning within normal limits. All routine lines, monitors and drainage devices have been discontinued in a stepwise standard fashion. Incision is noted to be healing well without evidence of infection. He continues to require oxygen which she used at home at 2 L nasal cannula. His blood sugars have  been under adequate control using standard protocols with resumption of his metformin. He is tolerating gradually increasing activities using standard protocols. His overall status is felt to be stable for discharge on today's date.  Consults: None  Significant Diagnostic Studies:none   Discharge Exam: Blood pressure 103/65, pulse 73, temperature 98.7 F (37.1 C), temperature source Oral, resp. rate 18, height 6\' 2"  (1.88 m), weight 208 lb 15.9 oz (94.8 kg), SpO2 95.00%. General appearance: alert, cooperative and no distress  Heart: regular rate and rhythm  Lungs: fairly clear throughout  Abdomen: bemign  Extremities: warm  Wound: incis healing well   Disposition: 01-Home or Self Care     Medication List    STOP taking these medications       acetaminophen-codeine 300-30 MG per tablet  Commonly known as:  TYLENOL #3      TAKE these medications       ACCU-CHEK SOFTCLIX LANCETS lancets  Test  once daily, dx 250.00     BD LANCET ULTRAFINE 30G Misc  1 each by Does not apply route daily.     acetaminophen 500 MG tablet  Commonly known as:  TYLENOL  Take 500 mg by mouth every 6 (six) hours as needed for mild pain.     amoxicillin 500 MG capsule  Commonly known as:  AMOXIL  Take 2,000 mg by mouth See admin instructions. 1 hour prior to dental procedures     aspirin EC 81 MG tablet  Take 1 tablet (81 mg total) by mouth daily.     atorvastatin 20 MG tablet  Commonly known as:  LIPITOR  Take 20 mg by mouth at bedtime.     budesonide-formoterol 160-4.5 MCG/ACT inhaler  Commonly known as:  SYMBICORT  Inhale 2 puffs into the lungs 2 (two) times daily.     carvedilol 3.125 MG tablet  Commonly known as:  COREG  Take 3.125 mg by mouth 2 (two) times daily with a meal.     furosemide 40 MG tablet  Commonly known as:  LASIX  Take 40 mg by mouth daily. May take 1 extra daily as needed for edema     glucose blood test strip  Commonly known as:  ACCU-CHEK AVIVA PLUS  Test once daily, dx 250.00     irbesartan 75 MG tablet  Commonly known as:  AVAPRO  Take 1 tablet (75 mg total) by mouth daily.     isosorbide dinitrate 10 MG tablet  Commonly known as:  ISORDIL  Take 10 mg by mouth 3 (three) times daily.     metFORMIN 500 MG tablet  Commonly known as:  GLUCOPHAGE  Take 500 mg by mouth daily with breakfast.     nitroGLYCERIN 0.4 MG SL tablet  Commonly known as:  NITROSTAT  Place 1 tablet (0.4 mg total) under the tongue every 5 (five) minutes as needed. For chest pain     oxyCODONE 5 MG immediate release tablet  Commonly known as:  Oxy IR/ROXICODONE  Take 1-2 tablets (5-10 mg total) by mouth every 6 (six) hours as needed for moderate pain or severe pain.           Follow-up Information   Follow up with [name], MD. (office will contact you for appts)    Specialty:  Cardiothoracic Surgery   Contact information:   [address] [place]  [address] [phone]       Signed: John Giovanni 12/03/2013, 8:57 AM

## 2013-12-03 NOTE — Progress Notes (Addendum)
[address]       [place] [address]             [phone]      4 Days Post-Op Procedure(s) (LRB): Video assisted thoracoscopy for wedge resection; mini thoracotomy (Left) Subjective: Feels ok, has O2 at home which he typically wears at night  Objective: Vital signs in last 24 hours: Temp:  [97.7 F (36.5 C)-98.7 F (37.1 C)] 98.7 F (37.1 C) (05/26 0433) Pulse Rate:  [73-76] 73 (05/26 0609) Cardiac Rhythm:  [-] Ventricular paced (05/25 1950) Resp:  [16-21] 18 (05/26 0609) BP: (90-122)/(52-66) 103/65 mmHg (05/26 0609) SpO2:  [91 %-98 %] 95 % (05/26 0715)  Hemodynamic parameters for last 24 hours:    Intake/Output from previous day: 05/25 0701 - 05/26 0700 In: 1040 [P.O.:1040] Out: 1870 [Urine:1850; Chest Tube:20] Intake/Output this shift:    General appearance: alert, cooperative and no distress Heart: regular rate and rhythm Lungs: fairly clear throughout Abdomen: bemign Extremities: warm Wound: incis healing well  Lab Results:  Recent Labs  12/01/13 0400  WBC 8.3  HGB 13.0  HCT 40.2  PLT PLATELET CLUMPS NOTED ON SMEAR, COUNT APPEARS DECREASED   BMET:  Recent Labs  12/01/13 0400  NA 135*  K 4.0  CL 95*  CO2 30  GLUCOSE 175*  BUN 25*  CREATININE 1.05  CALCIUM 8.6    PT/INR: No results found for this basename: LABPROT, INR,  in the last 72 hours ABG    Component Value Date/Time   PHART 7.414 11/30/2013 0439   HCO3 31.7* 11/30/2013 0439   TCO2 33 11/30/2013 0439   O2SAT 89.0 11/30/2013 0439   CBG (last 3)   Recent Labs  12/02/13 2033 12/03/13 0015 12/03/13 0430  GLUCAP 177* 159* 125*   Scheduled Meds: . acetaminophen  1,000 mg Oral 4 times per day   Or  . acetaminophen (TYLENOL) oral liquid 160 mg/5 mL  1,000 mg Oral 4 times per day  . albuterol  2.5 mg Nebulization BID  . aspirin EC  81 mg Oral Daily  . atorvastatin  20 mg Oral QHS  . bisacodyl  10 mg Oral Daily  . budesonide-formoterol  2 puff Inhalation  BID  . carvedilol  3.125 mg Oral BID WC  . furosemide  40 mg Oral Daily  . insulin aspart  0-24 Units Subcutaneous 6 times per day  . irbesartan  75 mg Oral Daily  . isosorbide dinitrate  10 mg Oral TID  . metFORMIN  500 mg Oral Q breakfast  . senna-docusate  1 tablet Oral QHS   Continuous Infusions:  PRN Meds:.albuterol, morphine injection, ondansetron (ZOFRAN) IV, oxyCODONE, potassium chloride, traMADol  Dg Chest Port 1 View  12/02/2013   CLINICAL DATA:  Chest tube removal.  Shortness of breath.  EXAM: PORTABLE CHEST - 1 VIEW  COMPARISON:  12/02/2013 and 12/01/2013.  FINDINGS: 0950 hr. Left chest tube has been removed. The left apical pneumothorax appears unchanged. Left perihilar atelectasis/ contusion and pleural thickening or loculated pleural fluid on the left are stable. There is persistent vascular congestion. The heart size and mediastinal contours are stable. Right subclavian pacemaker leads appear unchanged.  IMPRESSION: No significant change in left-sided apical pneumothorax following left chest tube removal. Stable probable left perihilar atelectasis/ contusion and vascular congestion.   Electronically Signed   By: Camie Patience M.D.   On: 12/02/2013 10:58   Dg Chest Port 1 View  12/02/2013   CLINICAL DATA:  chest tubes in place  EXAM: PORTABLE CHEST - 1 VIEW  COMPARISON:  the previous day's study  FINDINGS: Left chest tube remains in place with probable a small residual apical pneumothorax, projecting just below the posterior aspect third rib. Staple line noted in the left hilar and suprahilar regions. The right IJ central line has been removed. Right subclavian AICD stable. Mild cardiomegaly stable. Coarse interstitial and airspace opacities in both lung bases and around the left hilum as before. Blunting of the lateral costophrenic angle suggesting tiny effusions. .  Visualized skeletal structures are unremarkable.  IMPRESSION: 1. Central line removal. Otherwise stable appearance since  prior study   Electronically Signed   By: Arne Cleveland M.D.   On: 12/02/2013 09:22   Assessment/Plan: S/P Procedure(s) (LRB): Video assisted thoracoscopy for wedge resection; mini thoracotomy (Left) Plan for discharge: see discharge orders Has O2 at home which hecan use all the time for now Sugars adeq controlled    LOS: 4 days    [name] 12/03/2013  I have seen and examined the patient and agree with the assessment and plan as outlined.  [name] 12/03/2013 9:36 AM

## 2013-12-03 NOTE — Discharge Instructions (Signed)
Thoracoscopy Care After Refer to this sheet in the next few weeks. These discharge instructions provide you with general information on caring for yourself after you leave the hospital. Your caregiver may also give you specific instructions. Your treatment has been planned according to the most current medical practices available, but unavoidable complications sometimes occur. If you have any problems or questions after discharge, call your caregiver. HOME CARE INSTRUCTIONS   Remove the bandage (dressing) over your chest tube site as directed by your caregiver.  It is normal to be sore for a couple weeks following surgery. See your caregiver if this seems to be getting worse rather than better.  Only take over-the-counter or prescription medicines for pain, discomfort, or fever as directed by your caregiver. It is very important to take pain medicine when you need it so that you will cough and breathe deeply enough to clear mucus (phlegm) and expand your lungs.  If it hurts to cough, hold a pillow against your chest when you cough. This may help with the discomfort. In spite of the discomfort, cough frequently, as this helps protect against getting an infection in your lung (pneumonia).  Taking deep breaths keeps lungs inflated and protects against pneumonia. Most patients will go home with an incentive spirometer that encourages deep breathing.  You may resume a normal diet and activities as directed.  Use showers for bathing until you see your caregiver, or as instructed.  Change dressings if necessary or as directed.  Avoid lifting or driving until you are instructed otherwise.  Make an appointment to see your caregiver for stitch (suture) or staple removal when instructed.  Do not travel by airplane for 2 weeks after the chest tube is removed. SEEK MEDICAL CARE IF:   You are bleeding from your wounds.  You have redness, swelling, or increasing pain in the wounds.  Your heartbeat  feels irregular or very fast.  There is pus coming from your wounds.  There is a bad smell coming from the wound or dressing. SEEK IMMEDIATE MEDICAL CARE IF:   You have a fever.  You develop a rash.  You have difficulty breathing.  You develop any reaction or side effects to medicines given.  You develop lightheadedness or feel faint.  You develop shortness of breath or chest pain. MAKE SURE YOU:   Understand these instructions.  Will watch your condition.  Will get help right away if you are not doing well or get worse. Document Released: 01/14/2005 Document Revised: 09/19/2011 Document Reviewed: 12/15/2010 [facility] Patient Information 2014 [place], [place].

## 2013-12-05 ENCOUNTER — Telehealth: Payer: Self-pay

## 2013-12-05 ENCOUNTER — Ambulatory Visit: Payer: Medicare Other | Admitting: Family Medicine

## 2013-12-05 NOTE — Telephone Encounter (Signed)
**Note De-Identified [name] Obfuscation** We received an O2 order from [facility] for the pt. The pt was not on O2 at his last OV with Dr Ron Parker but has since had surgery with Dr Roxy Manns and according to the pts wife Dr Ricard Dillon wants the pt to be on O2 24/7.  I spoke with Charlene at [employer] who states that she will contact the pts PCP and Dr Ricard Dillon office to find out what the pts needs are at this time. [name] is advised to call me back if she has any questions, she verbalized understanding.

## 2013-12-09 ENCOUNTER — Ambulatory Visit: Payer: Medicare Other | Admitting: Pulmonary Disease

## 2013-12-11 ENCOUNTER — Ambulatory Visit (INDEPENDENT_AMBULATORY_CARE_PROVIDER_SITE_OTHER): Payer: Self-pay

## 2013-12-11 DIAGNOSIS — Z4802 Encounter for removal of sutures: Secondary | ICD-10-CM

## 2013-12-11 DIAGNOSIS — D381 Neoplasm of uncertain behavior of trachea, bronchus and lung: Secondary | ICD-10-CM

## 2013-12-11 DIAGNOSIS — G8918 Other acute postprocedural pain: Secondary | ICD-10-CM

## 2013-12-11 MED ORDER — OXYCODONE HCL 5 MG PO TABS: 5.0000 mg | ORAL_TABLET | Freq: Four times a day (QID) | ORAL | Status: DC | PRN

## 2013-12-11 NOTE — Progress Notes (Signed)
Removed 2 chest tube sutures from incision sites, no signs of infection and patient tolerated well. Refilled RX for oxycodone 5 mg.

## 2013-12-12 ENCOUNTER — Other Ambulatory Visit: Payer: Self-pay | Admitting: Family Medicine

## 2013-12-13 ENCOUNTER — Other Ambulatory Visit: Payer: Self-pay | Admitting: Thoracic Surgery (Cardiothoracic Vascular Surgery)

## 2013-12-13 DIAGNOSIS — I97190 Other postprocedural cardiac functional disturbances following cardiac surgery: Secondary | ICD-10-CM

## 2013-12-16 ENCOUNTER — Encounter: Payer: Self-pay | Admitting: Thoracic Surgery (Cardiothoracic Vascular Surgery)

## 2013-12-16 ENCOUNTER — Ambulatory Visit
Admission: RE | Admit: 2013-12-16 | Discharge: 2013-12-16 | Disposition: A | Discharge status: Home / Self Care | Payer: Medicare Other | Source: Ambulatory Visit | Attending: Thoracic Surgery (Cardiothoracic Vascular Surgery) | Admitting: Thoracic Surgery (Cardiothoracic Vascular Surgery)

## 2013-12-16 ENCOUNTER — Other Ambulatory Visit: Payer: Self-pay | Admitting: *Deleted

## 2013-12-16 ENCOUNTER — Ambulatory Visit (INDEPENDENT_AMBULATORY_CARE_PROVIDER_SITE_OTHER): Payer: Self-pay | Admitting: Thoracic Surgery (Cardiothoracic Vascular Surgery)

## 2013-12-16 ENCOUNTER — Encounter (INDEPENDENT_AMBULATORY_CARE_PROVIDER_SITE_OTHER): Payer: Self-pay

## 2013-12-16 VITALS — BP 106/63 | HR 79 | Resp 16 | Ht 74.0 in | Wt 207.0 lb

## 2013-12-16 DIAGNOSIS — G8918 Other acute postprocedural pain: Secondary | ICD-10-CM

## 2013-12-16 DIAGNOSIS — Z09 Encounter for follow-up examination after completed treatment for conditions other than malignant neoplasm: Secondary | ICD-10-CM

## 2013-12-16 DIAGNOSIS — C3410 Malignant neoplasm of upper lobe, unspecified bronchus or lung: Secondary | ICD-10-CM

## 2013-12-16 DIAGNOSIS — I97190 Other postprocedural cardiac functional disturbances following cardiac surgery: Secondary | ICD-10-CM

## 2013-12-16 DIAGNOSIS — J9383 Other pneumothorax: Secondary | ICD-10-CM | POA: Diagnosis not present

## 2013-12-16 MED ORDER — OXYCODONE HCL 5 MG PO TABS: 5.0000 mg | ORAL_TABLET | Freq: Four times a day (QID) | ORAL | Status: DC | PRN

## 2013-12-16 NOTE — Progress Notes (Signed)
[address]       [place] [address]             [phone]     CARDIOTHORACIC SURGERY OFFICE NOTE  Referring Provider is Rigoberto Noel, MD PCP is Joycelyn Man, MD   HPI:  Patient returns for routine followup status post wedge resection of small, peripherally located non-small cell carcinoma of the lung on 11/29/2013. Despite the patient's numerous comorbid medical problems including Chronic combined systolic and diastolic congestive heart failure, severe COPD, and permanent atrial fibrillation, his postoperative recovery has been remarkably uncomplicated. Final surgical pathology was notably consistent with well-differentiated invasive adenocarcinoma (T1aN0 Stage IA) with negative surgical margins.  The patient returns to the office for routine followup today. He reports that other than the presence of persistent soreness in his left lateral chest wall he is doing well. He was on home oxygen therapy prior to surgery, and he has continued to use oxygen since discharge. He denies any problems with shortness of breath. He is sleeping well although he still sleep in a recliner because it is uncomfortable to lay flat in bed. He is eating well. He is ambulating without limitation. He denies any productive cough. He has not had any dizzy spells. The remainder of his review of systems is unremarkable.   Current Outpatient Prescriptions  Medication Sig Dispense Refill  . ACCU-CHEK SOFTCLIX LANCETS lancets Test once daily, dx 250.00  100 each  12  . acetaminophen (TYLENOL) 500 MG tablet Take 500 mg by mouth every 6 (six) hours as needed for mild pain.       Marland Kitchen amoxicillin (AMOXIL) 500 MG capsule Take 2,000 mg by mouth See admin instructions. 1 hour prior to dental procedures      . aspirin EC 81 MG tablet Take 1 tablet (81 mg total) by mouth daily.  90 tablet  3  . atorvastatin (LIPITOR) 20 MG tablet Take 20 mg by mouth at bedtime.      . budesonide-formoterol  (SYMBICORT) 160-4.5 MCG/ACT inhaler Inhale 2 puffs into the lungs 2 (two) times daily.      . carvedilol (COREG) 3.125 MG tablet Take 3.125 mg by mouth 2 (two) times daily with a meal.      . furosemide (LASIX) 40 MG tablet Take 40 mg by mouth daily. May take 1 extra daily as needed for edema      . glucose blood (ACCU-CHEK AVIVA PLUS) test strip Test once daily, dx 250.00  100 each  12  . irbesartan (AVAPRO) 75 MG tablet Take 1 tablet (75 mg total) by mouth daily.  90 tablet  3  . isosorbide dinitrate (ISORDIL) 10 MG tablet Take 10 mg by mouth 3 (three) times daily.      . isosorbide dinitrate (ISORDIL) 10 MG tablet TAKE 1 TABLET BY MOUTH 3 TIMES A DAY  90 tablet  3  . Lancets (BD LANCET ULTRAFINE 30G) MISC 1 each by Does not apply route daily.  100 each  3  . metFORMIN (GLUCOPHAGE) 500 MG tablet Take 500 mg by mouth daily with breakfast.      . nitroGLYCERIN (NITROSTAT) 0.4 MG SL tablet Place 1 tablet (0.4 mg total) under the tongue every 5 (five) minutes as needed. For chest pain  25 tablet  1  . oxyCODONE (OXY IR/ROXICODONE) 5 MG immediate release tablet Take 1-2 tablets (5-10 mg total) by mouth every 6 (six) hours as needed for moderate pain or severe pain.  50 tablet  0   No current facility-administered medications for this visit.      Physical Exam:   BP 106/63  Pulse 79  Resp 16  Ht 6\' 2"  (1.88 m)  Wt 207 lb (93.895 kg)  BMI 26.57 kg/m2  SpO2 94%  General:  Well-appearing  Chest:   Clear to auscultation  CV:   Regular rate and rhythm  Incisions:  Clean and dry and healing nicely  Abdomen:  Soft and nontender  Extremities:  Warm and well-perfused  Diagnostic Tests:  CHEST 2 VIEW  COMPARISON: 12/03/2013.  FINDINGS:  Allowing for differences in technique, there is little interval  change compared to the prior exam 12/03/2013. Right subclavian AICD  is present. Bioprosthetic mitral valve replacement is present.  Cardiopericardial silhouette remains enlarged. Lung staples  are  present in the left upper lobe.  The left apical pneumothorax the still present with hazy opacity  around the pleural line, suggesting accumulated fluid and loculated  hydro pneumothorax. Stable appearance of the right long, with hilar  fullness.  Chronic eventration of the left hemidiaphragm. This accounts for  opacity over the lower thoracic spine on the lateral view.  IMPRESSION:  1. Left apical pneumothorax, with opacity along the margin  suggesting developing loculated hydro pneumothorax. Adjacent left  upper lobe lung staples. Postsurgical volume loss in the left lung.  2. Cardiomegaly with unchanged pacemaker apparatus and mitral valve  replacement.  3. Compared to prior exam 12/03/2013, little interval change when  accounting for technical differences.  Electronically Signed  By: Dereck Ligas M.D.  On: 12/16/2013 16:23    Impression:  The patient is recovering uneventfully following recent left upper lobe wedge resection for T1aN0 stage IA adenocarcinoma of the lung.  He appears to be back to his baseline with chronic respiratory failure related to chronic combined systolic and diastolic congestive heart failure, COPD, and permanent atrial fibrillation.    Plan:  The patient will be referred to Dr. [name] at the thoracic oncology clinic to discuss treatment options and long-term followup plans, although under the circumstances it would appear that there is probably no role for adjuvant chemotherapy. The patient has been reminded to followup with Dr. Ron Parker and Dr. Elsworth Soho for continued management of chronic respiratory failure and his underlying cardiac disease.  His prescription for oxycodone has been renewed in the office today. He will return in 6 weeks for further followup and repeat chest x-ray.   Valentina Gu. Roxy Manns, MD 12/16/2013 5:16 PM

## 2013-12-17 ENCOUNTER — Telehealth: Payer: Self-pay | Admitting: *Deleted

## 2013-12-17 NOTE — Telephone Encounter (Signed)
Called pt with an appt with Dr. Julien Nordmann. I spoke to both pt and his wife.  Wife verbalized understanding of appt time and place.

## 2013-12-20 ENCOUNTER — Encounter: Payer: Self-pay | Admitting: Cardiology

## 2013-12-23 ENCOUNTER — Other Ambulatory Visit: Payer: Self-pay | Admitting: *Deleted

## 2013-12-23 DIAGNOSIS — C3490 Malignant neoplasm of unspecified part of unspecified bronchus or lung: Secondary | ICD-10-CM

## 2013-12-24 ENCOUNTER — Other Ambulatory Visit: Payer: Self-pay | Admitting: Internal Medicine

## 2013-12-24 ENCOUNTER — Encounter: Payer: Self-pay | Admitting: Internal Medicine

## 2013-12-24 ENCOUNTER — Ambulatory Visit (HOSPITAL_BASED_OUTPATIENT_CLINIC_OR_DEPARTMENT_OTHER): Payer: Medicare Other | Admitting: Internal Medicine

## 2013-12-24 ENCOUNTER — Other Ambulatory Visit (HOSPITAL_BASED_OUTPATIENT_CLINIC_OR_DEPARTMENT_OTHER): Payer: Medicare Other

## 2013-12-24 VITALS — BP 94/51 | HR 72 | Temp 97.7°F | Resp 18 | Ht 74.0 in | Wt 204.7 lb

## 2013-12-24 DIAGNOSIS — C3490 Malignant neoplasm of unspecified part of unspecified bronchus or lung: Secondary | ICD-10-CM

## 2013-12-24 DIAGNOSIS — J449 Chronic obstructive pulmonary disease, unspecified: Secondary | ICD-10-CM | POA: Diagnosis not present

## 2013-12-24 DIAGNOSIS — C3410 Malignant neoplasm of upper lobe, unspecified bronchus or lung: Secondary | ICD-10-CM

## 2013-12-24 LAB — COMPREHENSIVE METABOLIC PANEL (CC13)
ALK PHOS: 67 U/L (ref 40–150)
ALT: 14 U/L (ref 0–55)
AST: 15 U/L (ref 5–34)
Albumin: 3.9 g/dL (ref 3.5–5.0)
Anion Gap: 10 mEq/L (ref 3–11)
BILIRUBIN TOTAL: 2.24 mg/dL — AB (ref 0.20–1.20)
BUN: 22.4 mg/dL (ref 7.0–26.0)
CO2: 32 mEq/L — ABNORMAL HIGH (ref 22–29)
Calcium: 9.2 mg/dL (ref 8.4–10.4)
Chloride: 100 mEq/L (ref 98–109)
Creatinine: 1 mg/dL (ref 0.7–1.3)
GLUCOSE: 118 mg/dL (ref 70–140)
POTASSIUM: 4.4 meq/L (ref 3.5–5.1)
Sodium: 141 mEq/L (ref 136–145)
Total Protein: 7 g/dL (ref 6.4–8.3)

## 2013-12-24 LAB — CBC WITH DIFFERENTIAL/PLATELET
BASO%: 1.3 % (ref 0.0–2.0)
Basophils Absolute: 0.1 10*3/uL (ref 0.0–0.1)
EOS ABS: 0.3 10*3/uL (ref 0.0–0.5)
EOS%: 4 % (ref 0.0–7.0)
HCT: 41.1 % (ref 38.4–49.9)
HGB: 13.3 g/dL (ref 13.0–17.1)
LYMPH%: 10.8 % — AB (ref 14.0–49.0)
MCH: 30.7 pg (ref 27.2–33.4)
MCHC: 32.5 g/dL (ref 32.0–36.0)
MCV: 94.7 fL (ref 79.3–98.0)
MONO#: 0.5 10*3/uL (ref 0.1–0.9)
MONO%: 7.2 % (ref 0.0–14.0)
NEUT%: 76.7 % — ABNORMAL HIGH (ref 39.0–75.0)
NEUTROS ABS: 5.6 10*3/uL (ref 1.5–6.5)
PLATELETS: 196 10*3/uL (ref 140–400)
RBC: 4.34 10*6/uL (ref 4.20–5.82)
RDW: 15.5 % — ABNORMAL HIGH (ref 11.0–14.6)
WBC: 7.3 10*3/uL (ref 4.0–10.3)
lymph#: 0.8 10*3/uL — ABNORMAL LOW (ref 0.9–3.3)

## 2013-12-24 NOTE — Progress Notes (Signed)
[place] [phone]   [phone]) [phone]  CONSULT NOTE  REFERRING PHYSICIAN: Dr. Darylene Price  REASON FOR CONSULTATION:  68 years old white male recently diagnosed with lung cancer. HPI [name] is a 68 y.o. male with past medical history significant for multiple medical problems including history of COPD, hypertension, dyslipidemia, pulmonary hypertension, cardiomyopathy, myocardial infarction, renal artery stenosis, diabetes mellitus, stroke, atrial fibrillation and long history of smoking. The patient was admitted to [facility] in February of 2015 complaining of increasing shortness of breath and chest discomfort was questionable pneumonia. During his evaluation CT scan of the chest was performed on 08/13/2013 and it showed spiculated left upper lobe nodule measuring up to 1.5 CM suspicious for primary bronchogenic carcinoma. The patient was seen by Dr. Lillie Fragmin and a PET scan was performed on 10/29/2013 and it showed a 1.6 x 1.1 cmmacrolobulated  lesion with slightly spiculated margins in the apex of the left upper lobe, abutting the pleura, which is mildly hypermetabolic (SUVmax = 3.6) concerning for primary bronchogenic neoplasm. No associated mediastinal or hilar lymphadenopathy and no definite signs of metastatic disease. No other definite suspicious appearing pulmonary nodules or masses are identified. CT scan of the head on 11/22/2013 showed no evidence for metastatic disease to the brain. On Nov 29, 2013 the patient underwent left VATS with wedge resection of the left upper lobe under the care of Dr. Roxy Manns. The final pathology (Accession: [id number]) showed invasive well-differentiated adenocarcinoma measuring 1.3 CM was negative visceral pleural invasion and negative lymphovascular invasion. The invasive tumor is 1.1 CM from the nearest surgical margin. The dissected lymph nodes were negative for malignancy. Dr. Roxy Manns kindly referred the patient  to me today for further evaluation and recommendation regarding treatment of his condition. When seen today the patient continues to have soreness on the left side of the chest after the surgical resection and he is currently on oxycodone 5 mg by mouth every 6 hours as needed for pain. He also has baseline shortness of breath and currently on home oxygen. He denied having any significant cough or hemoptysis. He lost around 10 pounds over the last 4 months. He denied having any nausea or vomiting, no fever or chills. Family history significant for a mother diagnosed with a stroke, father had lung cancer and brother head and neck cancer. The patient is married and has 1 daughter. He was accompanied by his wife [name]. He is currently retired and used to work for the [employer]. He has a history of smoking one half pack per day for around 41 years and quit in February of 2015. He has no history of alcohol or drug abuse. HPI  Past Medical History  Diagnosis Date  . Hypertension   . Endocarditis     Bacterial, 2009  . Colon polyps   . Dyslipidemia   . Atrial fibrillation     AV Node ablation January, 2010, for rapid atrial fib  . Cardiomyopathy     non-ischemic  . Ejection fraction < 50%   . Atrial septal defect     Closed with surgery January, 2010  . Mitral regurgitation     Severe symptomatic primary MR due to bacterial endocarditis, treated w/ MVR  . Intracranial hemorrhage     Coumadin cannot be used because of the history of his bleed  . Renal artery stenosis     Mild by history  . Pulmonary hypertension     Moderate  . HLD (  hyperlipidemia)   . Automatic implantable cardioverter-defibrillator in situ     LV dysfunction and pacer needed for AV node lesion  . CVA (cerebral vascular accident) 2009    denies residual on 08/14/2013  . CHF (congestive heart failure)   . Myocardial infarction 2010  . Dysrhythmia   . Pre-diabetes   . Pneumonia     "this is my first case" (08/14/2013)    . Spontaneous pneumothorax     right thoracotomy - distant past  . Permanent atrial fibrillation     Originally Coumadin use for atrial fibrillation  //   he had intracerebral hemorrhage with an INR of 2.3 June, 2009. Anticoagulation could no longer be used.  //  Rapid atrial fibrillation after inferior MI October, 2010..........Marland Kitchen AV node ablation done at that time with ICD pacemaker placed (EF 35%).   //   Left atrial appendage tied off at the time of mitral valve surgery January, 2010 (maze pro  . Status post minimally invasive mitral valve replacement with bioprosthetic valve     33 mm Medtronic Mosaic porcine bioprosthesis placed via right mini thoracotomy for bacterial endocarditis complicated by severe MR and CHF   . Prosthetic valve dysfunction     Mild mitral stenosis  . Chronic combined systolic and diastolic CHF (congestive heart failure)   . Pacemaker   . Diabetes mellitus without complication   . Headache(784.0)     related to stroke only  . Cancer     lung- per pt.   Marland Kitchen COPD (chronic obstructive pulmonary disease)     O2- 2 liters, nasal cannula, q night   . COPD GOLD II 01/11/2007    PFT's 09/25/13  FEV1  2.18 (61%) ratio 64 no change p B2 and DLC0  32% corrects to 68% - trial off advair and acei rec starting  08/23/2013     . Lung cancer 11/29/2013    T1N0 Stage Ia non-small cell carcinoma left lung treated with wedge resection    Past Surgical History  Procedure Laterality Date  . Mitral valve replacement Right 07/17/2008    56mm Medtronic Mosaic porcine bioprosthesis  . Cardiac defibrillator placement  ~ 2010    St Jude  . Penile prosthesis implant    . Appendectomy    . Mass biopsy Left     neck mass  . Leep    . Cataract extraction w/ intraocular lens  implant, bilateral Bilateral   . Asd repair, secundum  07/17/2008    pericardial patch closure of ASD  . Av node ablation  07/2008    for rapid atrial fib  . Thoracotomy Right 1970's    spontaneous pneumothorax - while  in the [employer]  . Tonsillectomy    . Insert / replace / remove pacemaker    . Cardiac valve replacement    . Cardiac catheterization    . Video assisted thoracoscopy (vats)/wedge resection Left 11/29/2013    Procedure: Video assisted thoracoscopy for wedge resection; mini thoracotomy;  Surgeon: [name], MD;  Location[facility] OR;  Service: Thoracic;  Laterality: Left;    Family History  Problem Relation Age of Onset  . Stomach cancer Father   . Cancer Father   . Stroke      family history  . Stroke Mother     Social History History  Substance Use Topics  . Smoking status: Former Smoker -- 1.00 packs/day for 45 years    Types: Cigarettes    Quit date: 08/11/2013  .  Smokeless tobacco: Never Used     Comment: 08/14/2013 "quit smoking in 2009"  . Alcohol Use: Yes     Comment: 08/14/2013 "used to drink beer; quit:in 1982"    Allergies  Allergen Reactions  . Anticoagulant Compound     Pt had intracranial bleed, therefore all anticoagulation is contra-indicated per Dr. Ron Parker.  . Warfarin Sodium     Current Outpatient Prescriptions  Medication Sig Dispense Refill  . ACCU-CHEK SOFTCLIX LANCETS lancets Test once daily, dx 250.00  100 each  12  . acetaminophen (TYLENOL) 500 MG tablet Take 500 mg by mouth every 6 (six) hours as needed for mild pain.       Marland Kitchen amoxicillin (AMOXIL) 500 MG capsule Take 2,000 mg by mouth See admin instructions. 1 hour prior to dental procedures      . aspirin EC 81 MG tablet Take 1 tablet (81 mg total) by mouth daily.  90 tablet  3  . atorvastatin (LIPITOR) 20 MG tablet Take 20 mg by mouth at bedtime.      . budesonide-formoterol (SYMBICORT) 160-4.5 MCG/ACT inhaler Inhale 2 puffs into the lungs 2 (two) times daily.      . carvedilol (COREG) 3.125 MG tablet Take 3.125 mg by mouth 2 (two) times daily with a meal.      . furosemide (LASIX) 40 MG tablet Take 40 mg by mouth daily. May take 1 extra daily as needed for edema      . glucose blood (ACCU-CHEK AVIVA  PLUS) test strip Test once daily, dx 250.00  100 each  12  . irbesartan (AVAPRO) 75 MG tablet Take 1 tablet (75 mg total) by mouth daily.  90 tablet  3  . isosorbide dinitrate (ISORDIL) 10 MG tablet TAKE 1 TABLET BY MOUTH 3 TIMES A DAY  90 tablet  3  . metFORMIN (GLUCOPHAGE) 500 MG tablet Take 500 mg by mouth daily with breakfast.      . nitroGLYCERIN (NITROSTAT) 0.4 MG SL tablet Place 1 tablet (0.4 mg total) under the tongue every 5 (five) minutes as needed. For chest pain  25 tablet  1  . oxyCODONE (OXY IR/ROXICODONE) 5 MG immediate release tablet Take 1-2 tablets (5-10 mg total) by mouth every 6 (six) hours as needed for moderate pain or severe pain.  50 tablet  0   No current facility-administered medications for this visit.    Review of Systems  Constitutional: positive for weight loss Eyes: negative Ears, nose, mouth, throat, and face: negative Respiratory: positive for dyspnea on exertion and pleurisy/chest pain Cardiovascular: negative Gastrointestinal: negative Genitourinary:negative Integument/breast: negative Hematologic/lymphatic: negative Musculoskeletal:negative Neurological: negative Behavioral/Psych: negative Endocrine: negative Allergic/Immunologic: negative  Physical Exam  NAT:FTDDU, healthy, no distress, well nourished and well developed SKIN: skin color, texture, turgor are normal HEAD: Normocephalic, No masses, lesions, tenderness or abnormalities EYES: normal, PERRLA EARS: External ears normal, Canals clear OROPHARYNX:no exudate and no erythema  NECK: supple, no adenopathy, no JVD LYMPH:  no palpable lymphadenopathy, no hepatosplenomegaly LUNGS: clear to auscultation , and palpation HEART: irregularly irregular ABDOMEN:abdomen soft, non-tender, normal bowel sounds and no masses or organomegaly BACK: Back symmetric, no curvature., No CVA tenderness EXTREMITIES:no joint deformities, effusion, or inflammation, no edema, no skin discoloration  NEURO: alert  & oriented x 3 with fluent speech, no focal motor/sensory deficits  PERFORMANCE STATUS: ECOG 1  LABORATORY DATA: Lab Results  Component Value Date   WBC 7.3 12/24/2013   HGB 13.3 12/24/2013   HCT 41.1 12/24/2013   MCV 94.7 12/24/2013  PLT 196 12/24/2013      Chemistry      Component Value Date/Time   NA 141 12/24/2013 1420   NA 135* 12/01/2013 0400   K 4.4 12/24/2013 1420   K 4.0 12/01/2013 0400   CL 95* 12/01/2013 0400   CO2 32* 12/24/2013 1420   CO2 30 12/01/2013 0400   BUN 22.4 12/24/2013 1420   BUN 25* 12/01/2013 0400   CREATININE 1.0 12/24/2013 1420   CREATININE 1.05 12/01/2013 0400      Component Value Date/Time   CALCIUM 9.2 12/24/2013 1420   CALCIUM 8.6 12/01/2013 0400   ALKPHOS 67 12/24/2013 1420   ALKPHOS 52 12/01/2013 0400   AST 15 12/24/2013 1420   AST 16 12/01/2013 0400   ALT 14 12/24/2013 1420   ALT 10 12/01/2013 0400   BILITOT 2.24* 12/24/2013 1420   BILITOT 2.6* 12/01/2013 0400       RADIOGRAPHIC STUDIES: Dg Chest 2 View  12/16/2013   CLINICAL DATA:  Vats 11/29/2013.  Short of breath.  EXAM: CHEST  2 VIEW  COMPARISON:  12/03/2013.  FINDINGS: Allowing for differences in technique, there is little interval change compared to the prior exam 12/03/2013. Right subclavian AICD is present. Bioprosthetic mitral valve replacement is present. Cardiopericardial silhouette remains enlarged. Lung staples are present in the left upper lobe.  The left apical pneumothorax the still present with hazy opacity around the pleural line, suggesting accumulated fluid and loculated hydro pneumothorax. Stable appearance of the right long, with hilar fullness.  Chronic eventration of the left hemidiaphragm. This accounts for opacity over the lower thoracic spine on the lateral view.  IMPRESSION: 1. Left apical pneumothorax, with opacity along the margin suggesting developing loculated hydro pneumothorax. Adjacent left upper lobe lung staples. Postsurgical volume loss in the left lung. 2. Cardiomegaly with  unchanged pacemaker apparatus and mitral valve replacement. 3. Compared to prior exam 12/03/2013, little interval change when accounting for technical differences.   Electronically Signed   By: Dereck Ligas M.D.   On: 12/16/2013 16:23   Dg Chest 2 View  12/03/2013   CLINICAL DATA:  Left upper lobe lung resection for cancer  EXAM: CHEST  2 VIEW  COMPARISON:  12/02/2013  FINDINGS: Moderate to severe cardiac enlargement stable. 2 lead cardiac pacer is in unchanged position. Postsurgical change superior to the left hilum is stable. Lateral left pleural thickening/loculated effusion, unchanged. There is mild diffuse interstitial prominence. When compared to the prior study, the severity of interstitial opacification has decreased.  IMPRESSION: 1. Stable small left upper lobe pneumothorax 2. Interval decrease in the severity of bilateral interstitial change likely representing resolving pulmonary edema.   Electronically Signed   By: Skipper Cliche M.D.   On: 12/03/2013 08:18   Chest 2 View  11/28/2013   CLINICAL DATA:  Hypertension.  Endocarditis.  EXAM: CHEST  2 VIEW  COMPARISON:  08/16/2013.  FINDINGS: The heart is enlarged but stable. The pacer wires are stable. There are advanced emphysematous changes and chronic pulmonary scarring along with pleural thickening. No acute overlying pulmonary process. No pleural effusion.  IMPRESSION: Stable cardiac enlargement and significant chronic lung changes but no definite acute overlying pulmonary process.   Electronically Signed   By: Kalman Jewels M.D.   On: 11/28/2013 10:31   Dg Chest Port 1 View  12/02/2013   CLINICAL DATA:  Chest tube removal.  Shortness of breath.  EXAM: PORTABLE CHEST - 1 VIEW  COMPARISON:  12/02/2013 and 12/01/2013.  FINDINGS: 0950 hr. Left  chest tube has been removed. The left apical pneumothorax appears unchanged. Left perihilar atelectasis/ contusion and pleural thickening or loculated pleural fluid on the left are stable. There is  persistent vascular congestion. The heart size and mediastinal contours are stable. Right subclavian pacemaker leads appear unchanged.  IMPRESSION: No significant change in left-sided apical pneumothorax following left chest tube removal. Stable probable left perihilar atelectasis/ contusion and vascular congestion.   Electronically Signed   By: Camie Patience M.D.   On: 12/02/2013 10:58   Dg Chest Port 1 View  12/02/2013   CLINICAL DATA:  chest tubes in place  EXAM: PORTABLE CHEST - 1 VIEW  COMPARISON:  the previous day's study  FINDINGS: Left chest tube remains in place with probable a small residual apical pneumothorax, projecting just below the posterior aspect third rib. Staple line noted in the left hilar and suprahilar regions. The right IJ central line has been removed. Right subclavian AICD stable. Mild cardiomegaly stable. Coarse interstitial and airspace opacities in both lung bases and around the left hilum as before. Blunting of the lateral costophrenic angle suggesting tiny effusions. .  Visualized skeletal structures are unremarkable.  IMPRESSION: 1. Central line removal. Otherwise stable appearance since prior study   Electronically Signed   By: Arne Cleveland M.D.   On: 12/02/2013 09:22   Dg Chest Port 1 View  12/01/2013   CLINICAL DATA:  Status post left upper lobe resection.  EXAM: PORTABLE CHEST - 1 VIEW  COMPARISON:  DG CHEST 1V PORT dated 11/30/2013; DG CHEST 1V PORT dated 11/29/2013  FINDINGS: Right IJ central venous catheter tip projects over the superior vena cava, unchanged. Multi lead pacer apparatus overlies the right chest wall, leads appear stable in position. Stable cardiomegaly. Slight interval increase an bilateral heterogeneous opacities, most pronounced within the right mid lower lung. Probable small bilateral pleural effusions. Left chest tube remains in place. Tiny residual left apical pneumothorax, not significantly changed from prior.  IMPRESSION: Tiny residual left apical  pneumothorax unchanged from prior with left chest tube in place.  Stable support apparatus.  Cardiomegaly with slight interval increase in bilateral heterogeneous opacities.   Electronically Signed   By: Lovey Newcomer M.D.   On: 12/01/2013 10:57   Dg Chest Port 1 View  11/30/2013   CLINICAL DATA:  Chest tube placement  EXAM: PORTABLE CHEST - 1 VIEW  COMPARISON:  the previous day's study  FINDINGS: Left chest tube has retracted slightly, tip still above the level of the aortic arch. Pleural line projects over the posterior aspect left third rib as before consistent with small residual apical pneumothorax. Staple line in the left suprahilar region with some adjacent parenchymal opacification, stable. Right IJ central line stable. Right subclavian AICD stable. Moderate cardiomegaly stable. Coarse interstitial and alveolar opacities bilaterally left greater than right involving bases more than apices as before. No definite effusion. .  Degenerative changes in the left shoulder.  IMPRESSION: 1. Slight retraction of left chest tube with stable small apical pneumothorax. 2. Little change in asymmetric pulmonary opacities left greater than right. 3. Stable cardiomegaly.   Electronically Signed   By: Arne Cleveland M.D.   On: 11/30/2013 10:15   Dg Chest Port 1 View  11/29/2013   CLINICAL DATA:  Cardiac arrhythmia  EXAM: PORTABLE CHEST - 1 VIEW  COMPARISON:  Study obtained earlier in the day  FINDINGS: Endotracheal tube is no longer appreciable. There is a right jugular catheter with tip in the superior vena cava. There is  a chest tube on the left. There is a small left apical pneumothorax which is stable. There is consolidation in the left upper lobe. There is cardiomegaly with diffuse interstitial edema. Pacemaker leads are attached to the right atrium and right ventricle, stable.  IMPRESSION: Endotracheal tube no longer present. Central catheter tip in superior vena cava. There is a chest tube on the left with stable  left apical pneumothorax. There is cardiomegaly with interstitial edema consistent with a degree of congestive heart failure. Consolidation in the left upper lobe persists without change. There is no appreciable new opacity.   Electronically Signed   By: Lowella Grip M.D.   On: 11/29/2013 11:10   Dg Chest Portable 1 View  11/29/2013   CLINICAL DATA:  Incorrect needle count  EXAM: PORTABLE CHEST - 1 VIEW  COMPARISON:  Portable exam 0957 hr compared to 11/28/2013  FINDINGS: Tip of endotracheal tube projects 3.8 cm above carina with additional limb in LEFT mainstem bronchus.  RIGHT jugular central venous catheter tip projects over SVC confluence.  RIGHT subclavian transvenous pacemaker/ AICD leads project over RIGHT ventricle and coronary sinus.  LEFT thoracostomy tube present.  Staple line at LEFT upper lobe.  No definite radiopaque foreign body sites retained needle identified.  Diffuse BILATERAL pulmonary infiltrates question edema.  Atelectasis at LEFT upper lobe with small LEFT apex pneumothorax.  IMPRESSION: No definite retained metallic foreign body/needle identified.  Enlargement of cardiac silhouette with diffuse pulmonary infiltrates likely edema.  Atelectasis in LEFT upper lobe with LEFT apex pneumothorax despite thoracostomy tube.  Findings called to OR 15 on 11/29/2013 at 1021 hr.   Electronically Signed   By: Lavonia Dana M.D.   On: 11/29/2013 10:22    ASSESSMENT: This is a very pleasant 68 years old white male recently diagnosed with stage IA (T1a, N0, M0) non-small cell lung cancer, well-differentiated adenocarcinoma diagnosed in February of 2015 status post wedge resection of the left upper lobe under the care of Dr. Roxy Manns on 11/29/2013.   PLAN: I had a lengthy discussion with the patient and his wife today about his current disease stage, prognosis and treatment options. I explained to the patient that there is no survival benefit for adjuvant chemotherapy for stage IA non-small cell lung  cancer. The 5 year survival for stage IA non-small cell lung cancer is in the range of 80%. I recommended for the patient to continue on observation with repeat CT scan of the chest every 6 months for the first 2 years then annually for a total of 5 years. His surgical resection has close resection margin but no involvement of the margin. I will continue to monitor him closely with repeat scans. For COPD and shortness of breath, the patient will follow up with Dr. Elsworth Soho. He was advised to call immediately if he has any concerning symptoms in the interval.  The patient voices understanding of current disease status and treatment options and is in agreement with the current care plan.  All questions were answered. The patient knows to call the clinic with any problems, questions or concerns. We can certainly see the patient much sooner if necessary.  Thank you so much for allowing me to participate in the care of [name]. I will continue to follow up the patient with you and assist in his care.  I spent 40 minutes counseling the patient face to face. The total time spent in the appointment was 60 minutes.  Disclaimer: This note was dictated with voice  recognition software. Similar sounding words can inadvertently be transcribed and may not be corrected upon review.   MOHAMED,MOHAMED K. 12/24/2013, 3:25 PM

## 2013-12-25 ENCOUNTER — Ambulatory Visit: Payer: Medicare Other | Admitting: Cardiology

## 2013-12-25 ENCOUNTER — Encounter: Payer: Self-pay | Admitting: Internal Medicine

## 2013-12-26 ENCOUNTER — Telehealth: Payer: Self-pay | Admitting: Internal Medicine

## 2013-12-26 NOTE — Telephone Encounter (Signed)
s.w. pt wife and advised on Dec appt ok and aware

## 2014-01-04 ENCOUNTER — Other Ambulatory Visit: Payer: Self-pay | Admitting: Family Medicine

## 2014-01-08 ENCOUNTER — Ambulatory Visit: Payer: Medicare Other | Admitting: Pulmonary Disease

## 2014-01-08 ENCOUNTER — Encounter: Payer: Self-pay | Admitting: Pulmonary Disease

## 2014-01-08 VITALS — BP 124/76 | HR 70 | Temp 98.0°F | Ht 74.0 in | Wt 209.0 lb

## 2014-01-08 DIAGNOSIS — R911 Solitary pulmonary nodule: Secondary | ICD-10-CM

## 2014-01-08 DIAGNOSIS — J449 Chronic obstructive pulmonary disease, unspecified: Secondary | ICD-10-CM

## 2014-01-08 NOTE — Progress Notes (Signed)
   Subjective:    Patient ID: [name], male    DOB: March 09, 1946, 68 y.o.   MRN: [id number]  HPI  68 year old male smoker with ischemic cardiomyopathy for followup of COPD, hypoxia and lung cancer.  He has a 40+ PY hx of smoking  No discernible benefit from Advair (has been on for several years) , now on Symbicort  Significant tests/ events : Admitted 08/2013 from cardiology office for chest pain & resting O2 sats of 79% and working Dx of CAP.   Echo 08/2013 -EF 30-35% akinesis of the apex and multiple wall motion abnormalities. RV showed reduced systolic function . Cath 08/16/13 PCWP 25, Mean RA 11 mmHg  RV 54/5 mmHg  PA: 56/29 mean 39 mmHg  CO: 3.39    He had Chronic appearing pleural disease on right on CT chest & LUL 1.5 cm oblong opacity worrisome for malignancy  PFTs 09/2013 - FEV1 2.18- 61%, ratio 64, FVC 3.40 -71%, DLCO 11.0- 32%  CPET 11/20/13 FEV1 2.28 -58%, MVV 105 (71%), peak V O2 12.7, moderate-to-severe functional limitation due primarily to a circulatory limitation   11/29/13  left VATS with wedge resection of the left upper lobe - Dr. Roxy Manns.-invasive well-differentiated adenocarcinoma , neg LNs, close margins but no involvement  Chief Complaint  Patient presents with  . Follow-up    Pt reports breathing has been fine. Pt has very little dry cough.  No wheezing, no chest tx. Pt uses 2 liters O2 24/7.    Overall he is breathing better, goes without oxygen for short periods, no orhtopnea, PND,no pedal edema His breathing is doing okay He desaturates to 87% off oxygen    Review of Systems neg for any significant sore throat, dysphagia, itching, sneezing, nasal congestion or excess/ purulent secretions, fever, chills, sweats, unintended wt loss, pleuritic or exertional cp, hempoptysis, orthopnea pnd or change in chronic leg swelling. Also denies presyncope, palpitations, heartburn, abdominal pain, nausea, vomiting, diarrhea or change in bowel or urinary habits,  dysuria,hematuria, rash, arthralgias, visual complaints, headache, numbness weakness or ataxia.     Objective:   Physical Exam  Gen. Pleasant, well-nourished, in no distress ENT - no lesions, no post nasal drip Neck: No JVD, no thyromegaly, no carotid bruits Lungs: no use of accessory muscles, no dullness to percussion, clear without rales or rhonchi  Cardiovascular: Rhythm regular, heart sounds  normal, no murmurs or gallops, no peripheral edema Musculoskeletal: No deformities, no cyanosis or clubbing        Assessment & Plan:

## 2014-01-08 NOTE — Assessment & Plan Note (Signed)
stable You have to stay on oxygen - we will ask DME to evaluate for a portable concentrator OK to get your own oximeter Stay on symbicort

## 2014-01-08 NOTE — Assessment & Plan Note (Signed)
Surveillance CT scan in december

## 2014-01-08 NOTE — Patient Instructions (Signed)
COPD is stable You have to stay on oxygen - we will ask DME to evaluate for a portable concentrator OK to get your own oximeter Stay on symbicort CT scan in december

## 2014-01-09 ENCOUNTER — Telehealth: Payer: Self-pay | Admitting: Pulmonary Disease

## 2014-01-09 NOTE — Telephone Encounter (Signed)
Called spoke with Halesite from [facility]. He reports the POC's goes up to 2-3 l/m. I advised him we sent an order to evaluate pt for POC to see if he can tolerate 2-3 l/m w/ exertion. He will let the team know. Nothing further needed

## 2014-01-16 ENCOUNTER — Ambulatory Visit (INDEPENDENT_AMBULATORY_CARE_PROVIDER_SITE_OTHER): Payer: Medicare Other | Admitting: Family Medicine

## 2014-01-16 ENCOUNTER — Encounter: Payer: Self-pay | Admitting: Family Medicine

## 2014-01-16 VITALS — BP 90/56 | HR 75 | Temp 98.1°F | Wt 205.2 lb

## 2014-01-16 DIAGNOSIS — E139 Other specified diabetes mellitus without complications: Secondary | ICD-10-CM

## 2014-01-16 DIAGNOSIS — I219 Acute myocardial infarction, unspecified: Secondary | ICD-10-CM | POA: Diagnosis not present

## 2014-01-16 DIAGNOSIS — E089 Diabetes mellitus due to underlying condition without complications: Secondary | ICD-10-CM

## 2014-01-16 LAB — MICROALBUMIN / CREATININE URINE RATIO
CREATININE, U: 78.8 mg/dL
MICROALB/CREAT RATIO: 5.2 mg/g (ref 0.0–30.0)
Microalb, Ur: 4.1 mg/dL — ABNORMAL HIGH (ref 0.0–1.9)

## 2014-01-16 LAB — BASIC METABOLIC PANEL
BUN: 17 mg/dL (ref 6–23)
CO2: 29 meq/L (ref 19–32)
Calcium: 9.2 mg/dL (ref 8.4–10.5)
Chloride: 99 mEq/L (ref 96–112)
Creatinine, Ser: 0.9 mg/dL (ref 0.4–1.5)
GFR: 87.32 mL/min (ref >60.00)
GLUCOSE: 186 mg/dL — AB (ref 70–99)
POTASSIUM: 4.4 meq/L (ref 3.5–5.1)
SODIUM: 136 meq/L (ref 135–145)

## 2014-01-16 LAB — HEMOGLOBIN A1C: Hgb A1c MFr Bld: 7.3 % — ABNORMAL HIGH (ref 4.6–6.5)

## 2014-01-16 NOTE — Progress Notes (Signed)
   Subjective:    Patient ID: [name], male    DOB: August 30, 1945, 68 y.o.   MRN: [id number]  HPI Abayomi is a 68 year old male who comes in today for followup of diabetes  4 him in April with new-onset diabetes and start him on metformin 500 mg daily and a diabetic diet. He's not been following his diet he is taking his medication  He had a pulmonary lesion removed which turned out to be malignant. He is on oxygen continuously now   Review of Systems Review of systems otherwise negative    Objective:   Physical Exam  Well-developed well-nourished male no acute distress vital signs stable he is afebrile      Assessment & Plan:  Diabetes type 2.............. check labs.

## 2014-01-16 NOTE — Progress Notes (Signed)
Pre visit review using our clinic review tool, if applicable. No additional management support is needed unless otherwise documented below in the visit note. 

## 2014-01-16 NOTE — Patient Instructions (Signed)
Continue the metformin 500 mg............... one tablet daily in the morning with breakfast  Labs today  I will call you next week with the report

## 2014-01-22 ENCOUNTER — Other Ambulatory Visit: Payer: Self-pay | Admitting: Thoracic Surgery (Cardiothoracic Vascular Surgery)

## 2014-01-22 DIAGNOSIS — C3490 Malignant neoplasm of unspecified part of unspecified bronchus or lung: Secondary | ICD-10-CM

## 2014-01-27 ENCOUNTER — Encounter: Payer: Self-pay | Admitting: Thoracic Surgery (Cardiothoracic Vascular Surgery)

## 2014-01-27 ENCOUNTER — Ambulatory Visit (INDEPENDENT_AMBULATORY_CARE_PROVIDER_SITE_OTHER): Payer: Self-pay | Admitting: Thoracic Surgery (Cardiothoracic Vascular Surgery)

## 2014-01-27 ENCOUNTER — Ambulatory Visit: Payer: Medicare Other | Admitting: Thoracic Surgery (Cardiothoracic Vascular Surgery)

## 2014-01-27 ENCOUNTER — Ambulatory Visit
Admission: RE | Admit: 2014-01-27 | Discharge: 2014-01-27 | Disposition: A | Discharge status: Home / Self Care | Payer: Medicare Other | Source: Ambulatory Visit | Attending: Cardiothoracic Surgery | Admitting: Cardiothoracic Surgery

## 2014-01-27 ENCOUNTER — Other Ambulatory Visit: Payer: Self-pay | Admitting: Family Medicine

## 2014-01-27 VITALS — BP 86/52 | HR 77 | Resp 16 | Ht 74.0 in | Wt 196.0 lb

## 2014-01-27 DIAGNOSIS — C3410 Malignant neoplasm of upper lobe, unspecified bronchus or lung: Secondary | ICD-10-CM

## 2014-01-27 DIAGNOSIS — C3402 Malignant neoplasm of left main bronchus: Secondary | ICD-10-CM

## 2014-01-27 DIAGNOSIS — Z85118 Personal history of other malignant neoplasm of bronchus and lung: Secondary | ICD-10-CM | POA: Diagnosis not present

## 2014-01-27 DIAGNOSIS — E089 Diabetes mellitus due to underlying condition without complications: Secondary | ICD-10-CM

## 2014-01-27 DIAGNOSIS — C3400 Malignant neoplasm of unspecified main bronchus: Secondary | ICD-10-CM

## 2014-01-27 DIAGNOSIS — Z09 Encounter for follow-up examination after completed treatment for conditions other than malignant neoplasm: Secondary | ICD-10-CM

## 2014-01-27 DIAGNOSIS — C3490 Malignant neoplasm of unspecified part of unspecified bronchus or lung: Secondary | ICD-10-CM

## 2014-01-27 DIAGNOSIS — J9383 Other pneumothorax: Secondary | ICD-10-CM | POA: Diagnosis not present

## 2014-01-27 NOTE — Patient Instructions (Signed)
Patient may resume normal physical activity without any particular limitations at this time, and return to our office only as needed should any further problems or questions arise.

## 2014-01-27 NOTE — Progress Notes (Signed)
[address]       [place] [address]             [phone]     CARDIOTHORACIC SURGERY OFFICE NOTE  Referring Provider is Rigoberto Noel, MD PCP is Joycelyn Man, MD   HPI:  Patient returns for routine followup status post wedge resection of small, peripherally located non-small cell carcinoma of the lung on 11/29/2013.  He was last seen here in the office on 12/16/2013 at which time he was recovering well. Since then he has been seen in consultation by Dr. Julien Nordmann at the [facility].  He plans to continue to followup with Dr. Mckinley Jewel every 6 months.  He reports that he is doing very well. His breathing is back to baseline. He has mild residual soreness in his chest, but he is no longer taking any sort of pain relievers other than aspirin or Tylenol. Overall he has no complaints.    Current Outpatient Prescriptions  Medication Sig Dispense Refill  . ACCU-CHEK SOFTCLIX LANCETS lancets Test once daily, dx 250.00  100 each  12  . amoxicillin (AMOXIL) 500 MG capsule Take 2,000 mg by mouth See admin instructions. 1 hour prior to dental procedures      . aspirin EC 81 MG tablet Take 1 tablet (81 mg total) by mouth daily.  90 tablet  3  . atorvastatin (LIPITOR) 20 MG tablet TAKE 1 TABLET BY MOUTH EVERY DAY  90 tablet  2  . budesonide-formoterol (SYMBICORT) 160-4.5 MCG/ACT inhaler Inhale 2 puffs into the lungs 2 (two) times daily.      . carvedilol (COREG) 3.125 MG tablet TAKE 1 TABLET BY MOUTH TWICE A DAY WITH MEALS  180 tablet  2  . furosemide (LASIX) 40 MG tablet Take 40 mg by mouth daily. May take 1 extra daily as needed for edema      . glucose blood (ACCU-CHEK AVIVA PLUS) test strip Test once daily, dx 250.00  100 each  12  . irbesartan (AVAPRO) 75 MG tablet Take 1 tablet (75 mg total) by mouth daily.  90 tablet  3  . isosorbide dinitrate (ISORDIL) 10 MG tablet TAKE 1 TABLET BY MOUTH 3 TIMES A DAY  90 tablet  3  . metFORMIN (GLUCOPHAGE) 500 MG tablet Take  500 mg by mouth daily with breakfast.      . nitroGLYCERIN (NITROSTAT) 0.4 MG SL tablet Place 1 tablet (0.4 mg total) under the tongue every 5 (five) minutes as needed. For chest pain  25 tablet  1   No current facility-administered medications for this visit.      Physical Exam:   BP 86/52  Pulse 77  Resp 16  Ht 6\' 2"  (1.88 m)  Wt 196 lb (88.905 kg)  BMI 25.15 kg/m2  SpO2 98%  General:  Well-appearing  Chest:   Clear to auscultation  CV:   Irregular rate and rhythm without murmur  Incisions:  Well-healed  Abdomen:  Soft and nontender  Extremities:  Warm and well-perfused  Diagnostic Tests:  CHEST 2 VIEW  COMPARISON: 12/16/2013  FINDINGS:  Left upper lobe curvilinear scarring is reidentified, with trace  residual left apical pneumothorax reidentified. Right-sided AICD in  place. Moderate enlargement of the cardiac silhouette is  reidentified with hilar prominence. Hyperinflation suggests  emphysema. Linear bilateral lower lobe scarring or atelectasis is  noted. Elevation of the left hemidiaphragm is noted. Trace if any  pleural fluid.  IMPRESSION:  Trace residual left  apical pneumothorax reidentified without change  otherwise.  Electronically Signed  By: Conchita Paris M.D.  On: 01/27/2014 10:27    Impression:  The patient is recovering uneventfully following recent left upper lobe wedge resection for T1aN0 stage IA adenocarcinoma of the lung. He appears to be back to his baseline with chronic respiratory failure related to chronic combined systolic and diastolic congestive heart failure, COPD, and permanent atrial fibrillation.   Plan:  The patient will continue to followup with Dr. Mckinley Jewel for long-term surveillance related to his lung cancer. He will return to see [place] here in the office next May for routine followup 1 year postop.  Otherwise he will call and return to see [place] only as needed.    Valentina Gu. Roxy Manns, MD 01/27/2014 11:16 AM

## 2014-02-06 ENCOUNTER — Ambulatory Visit (INDEPENDENT_AMBULATORY_CARE_PROVIDER_SITE_OTHER): Payer: Medicare Other | Admitting: Cardiology

## 2014-02-06 ENCOUNTER — Encounter: Payer: Self-pay | Admitting: Cardiology

## 2014-02-06 VITALS — BP 100/62 | HR 73 | Ht 74.0 in | Wt 203.8 lb

## 2014-02-06 DIAGNOSIS — J9610 Chronic respiratory failure, unspecified whether with hypoxia or hypercapnia: Secondary | ICD-10-CM

## 2014-02-06 DIAGNOSIS — I5022 Chronic systolic (congestive) heart failure: Secondary | ICD-10-CM | POA: Diagnosis not present

## 2014-02-06 DIAGNOSIS — I219 Acute myocardial infarction, unspecified: Secondary | ICD-10-CM | POA: Diagnosis not present

## 2014-02-06 DIAGNOSIS — Z952 Presence of prosthetic heart valve: Secondary | ICD-10-CM

## 2014-02-06 DIAGNOSIS — I482 Chronic atrial fibrillation, unspecified: Secondary | ICD-10-CM

## 2014-02-06 DIAGNOSIS — I509 Heart failure, unspecified: Secondary | ICD-10-CM

## 2014-02-06 DIAGNOSIS — J9611 Chronic respiratory failure with hypoxia: Secondary | ICD-10-CM

## 2014-02-06 DIAGNOSIS — I4891 Unspecified atrial fibrillation: Secondary | ICD-10-CM | POA: Diagnosis not present

## 2014-02-06 DIAGNOSIS — Z953 Presence of xenogenic heart valve: Secondary | ICD-10-CM

## 2014-02-06 DIAGNOSIS — R0902 Hypoxemia: Secondary | ICD-10-CM

## 2014-02-06 NOTE — Assessment & Plan Note (Signed)
His volume status is stable. No change in therapy. 

## 2014-02-06 NOTE — Assessment & Plan Note (Signed)
He continues to do well after his tissue mitral valve in the past.

## 2014-02-06 NOTE — Progress Notes (Signed)
Patient ID: [name], male   DOB: 1945-12-15, 68 y.o.   MRN: [id number]    HPI  Patient is seen today to followup his overall cardiac status. When I saw him in the office last in April, 2015, high 8 outlined extensively his entire history. I also made a decision at that time that he could proceed with surgery for a lung nodule. This was done successfully and turned out to be lung cancer. He was treated with surgery only. He has not required chemotherapy or radiation. He does continue to have significant hypoxia and is using oxygen. He will be following up with the pulmonary team. He also has been seen in followup by Dr. Roxy Manns who removed his lung nodule. Overall the patient is doing very well. He is a remarkable gentleman who has undergone a multitude of procedures and has done well.  Allergies  Allergen Reactions  . Anticoagulant Compound     Pt had intracranial bleed, therefore all anticoagulation is contra-indicated per Dr. Ron Parker.  . Warfarin Sodium     Current Outpatient Prescriptions  Medication Sig Dispense Refill  . amoxicillin (AMOXIL) 500 MG capsule Take 2,000 mg by mouth See admin instructions. 1 hour prior to dental procedures      . aspirin EC 81 MG tablet Take 1 tablet (81 mg total) by mouth daily.  90 tablet  3  . atorvastatin (LIPITOR) 20 MG tablet TAKE 1 TABLET BY MOUTH EVERY DAY  90 tablet  2  . budesonide-formoterol (SYMBICORT) 160-4.5 MCG/ACT inhaler Inhale 2 puffs into the lungs 2 (two) times daily.      . carvedilol (COREG) 3.125 MG tablet TAKE 1 TABLET BY MOUTH TWICE A DAY WITH MEALS  180 tablet  2  . furosemide (LASIX) 40 MG tablet Take 40 mg by mouth daily. May take 1 extra daily as needed for edema      . irbesartan (AVAPRO) 75 MG tablet Take 1 tablet (75 mg total) by mouth daily.  90 tablet  3  . isosorbide dinitrate (ISORDIL) 10 MG tablet TAKE 1 TABLET BY MOUTH 3 TIMES A DAY  90 tablet  3  . metFORMIN (GLUCOPHAGE) 500 MG tablet Take 500 mg by mouth daily with  breakfast.      . nitroGLYCERIN (NITROSTAT) 0.4 MG SL tablet Place 1 tablet (0.4 mg total) under the tongue every 5 (five) minutes as needed. For chest pain  25 tablet  1   No current facility-administered medications for this visit.    History   Social History  . Marital Status: Married    Spouse Name: [name]    Number of Children: 0  . Years of Education: College   Occupational History  . Retired     Tour manager   Social History Main Topics  . Smoking status: Former Smoker -- 1.00 packs/day for 45 years    Types: Cigarettes    Quit date: 08/11/2013  . Smokeless tobacco: Never Used     Comment: 08/14/2013 "quit smoking in 2009"  . Alcohol Use: Yes     Comment: 08/14/2013 "used to drink beer; quit:in 1982"  . Drug Use: No  . Sexual Activity: Yes   Other Topics Concern  . Not on file   Social History Narrative   Patient lives at home with his spouse.   Caffeine Use: none    Family History  Problem Relation Age of Onset  . Stomach cancer Father   . Cancer Father   . Stroke  family history  . Stroke Mother     Past Medical History  Diagnosis Date  . Hypertension   . Endocarditis     Bacterial, 2009  . Colon polyps   . Dyslipidemia   . Atrial fibrillation     AV Node ablation January, 2010, for rapid atrial fib  . Cardiomyopathy     non-ischemic  . Ejection fraction < 50%   . Atrial septal defect     Closed with surgery January, 2010  . Mitral regurgitation     Severe symptomatic primary MR due to bacterial endocarditis, treated w/ MVR  . Intracranial hemorrhage     Coumadin cannot be used because of the history of his bleed  . Renal artery stenosis     Mild by history  . Pulmonary hypertension     Moderate  . HLD (hyperlipidemia)   . Automatic implantable cardioverter-defibrillator in situ     LV dysfunction and pacer needed for AV node lesion  . CVA (cerebral vascular accident) 2009    denies residual on 08/14/2013  . CHF (congestive heart  failure)   . Myocardial infarction 2010  . Dysrhythmia   . Pre-diabetes   . Pneumonia     "this is my first case" (08/14/2013)  . Spontaneous pneumothorax     right thoracotomy - distant past  . Permanent atrial fibrillation     Originally Coumadin use for atrial fibrillation  //   he had intracerebral hemorrhage with an INR of 2.3 June, 2009. Anticoagulation could no longer be used.  //  Rapid atrial fibrillation after inferior MI October, 2010..........Marland Kitchen AV node ablation done at that time with ICD pacemaker placed (EF 35%).   //   Left atrial appendage tied off at the time of mitral valve surgery January, 2010 (maze pro  . Status post minimally invasive mitral valve replacement with bioprosthetic valve     33 mm Medtronic Mosaic porcine bioprosthesis placed via right mini thoracotomy for bacterial endocarditis complicated by severe MR and CHF   . Prosthetic valve dysfunction     Mild mitral stenosis  . Chronic combined systolic and diastolic CHF (congestive heart failure)   . Pacemaker   . Diabetes mellitus without complication   . Headache(784.0)     related to stroke only  . Cancer     lung- per pt.   Marland Kitchen COPD (chronic obstructive pulmonary disease)     O2- 2 liters, nasal cannula, q night   . COPD GOLD II 01/11/2007    PFT's 09/25/13  FEV1  2.18 (61%) ratio 64 no change p B2 and DLC0  32% corrects to 68% - trial off advair and acei rec starting  08/23/2013     . Lung cancer 11/29/2013    T1N0 Stage Ia non-small cell carcinoma left lung treated with wedge resection    Past Surgical History  Procedure Laterality Date  . Mitral valve replacement Right 07/17/2008    15mm Medtronic Mosaic porcine bioprosthesis  . Cardiac defibrillator placement  ~ 2010    St Jude  . Penile prosthesis implant    . Appendectomy    . Mass biopsy Left     neck mass  . Leep    . Cataract extraction w/ intraocular lens  implant, bilateral Bilateral   . Asd repair, secundum  07/17/2008    pericardial patch  closure of ASD  . Av node ablation  07/2008    for rapid atrial fib  . Thoracotomy Right 1970's  spontaneous pneumothorax - while in the [employer]  . Tonsillectomy    . Insert / replace / remove pacemaker    . Cardiac valve replacement    . Cardiac catheterization    . Video assisted thoracoscopy (vats)/wedge resection Left 11/29/2013    Procedure: Video assisted thoracoscopy for wedge resection; mini thoracotomy;  Surgeon: [name], MD;  Location: [facility];  Service: Thoracic;  Laterality: Left;    Patient Active Problem List   Diagnosis Date Noted  . Chronic respiratory failure assoc with chf/ [facility] 08/12/2013    Priority: High  . Dyslipidemia     Priority: High  . Status post minimally invasive mitral valve replacement with bioprosthetic valve     Priority: Medium  . Permanent atrial fibrillation     Priority: Medium  . Ejection fraction < 50%     Priority: Medium  . ICD (implantable cardioverter-defibrillator), biventricular, in situ     Priority: Medium  . Follow-up examination, following unspecified surgery 01/27/2014  . Lung cancer 11/29/2013  . COPD (chronic obstructive pulmonary disease) 11/25/2013  . Prosthetic valve dysfunction   . Chronic combined systolic and diastolic CHF (congestive heart failure)   . Diabetes 11/04/2013  . Myocardial infarction 11/01/2013  . Cardiomyopathy, nonischemic 11/01/2013  . H/O intracranial hemorrhage 08/13/2013  . H/O endocarditis 08/13/2013  . H/O: CVA (cerebrovascular accident) 08/13/2013  . H/O atrioventricular nodal ablation   . Chronic systolic CHF (congestive heart failure) 01/08/2012  . Hypertension   . Renal artery stenosis   . Pulmonary hypertension   . CARCINOMA, SKIN, SQUAMOUS CELL 04/20/2009  . HYPERGLYCEMIA, FASTING 04/01/2009  . COLONIC POLYPS, ADENOMATOUS 03/22/2007  . COPD GOLD II 01/11/2007  . GERD 01/11/2007    ROS   Patient denies fever, chills, headache, sweats, rash, change in vision, change in  hearing, chest pain, cough, nausea or vomiting, urinary symptoms. All other systems are reviewed and are negative.  PHYSICAL EXAM  Patient is oriented to person time and place. Affect is normal. He is wearing his portable O2. He is here with his wife. Head is atraumatic. Sclera and conjunctiva are normal. There is no jugulovenous distention. Lungs are clear. Respiratory effort is nonlabored. Cardiac exam reveals S1 and S2. The rhythm is regular. Abdomen is soft. There is no peripheral edema.  Filed Vitals:   02/06/14 1026  BP: 100/62  Pulse: 73  Height: 6\' 2"  (1.88 m)  Weight: 203 lb 12.8 oz (92.443 kg)     ASSESSMENT & PLAN

## 2014-02-06 NOTE — Assessment & Plan Note (Signed)
The patient continues to require oxygen at home. This will be followed through the pulmonary team.

## 2014-02-06 NOTE — Assessment & Plan Note (Signed)
He has a non-small cell carcinoma of the lung. This was removed successfully. He is being followed by oncology.

## 2014-02-06 NOTE — Assessment & Plan Note (Signed)
He has chronic atrial fibrillation. His left appendage was tied off at the time of his mitral surgery in 2010. He's had an AV node ablation and has an ICD pacemaker in place. He is stable. He cannot be anticoagulated because of a prior cerebral bleed.

## 2014-02-06 NOTE — Patient Instructions (Signed)
Your physician recommends that you continue on your current medications as directed. Please refer to the Current Medication list given to you today.  Your physician wants you to follow-up in: 6 months. You will receive a reminder letter in the mail two months in advance. If you don't receive a letter, please call our office to schedule the follow-up appointment.  

## 2014-02-13 ENCOUNTER — Ambulatory Visit (INDEPENDENT_AMBULATORY_CARE_PROVIDER_SITE_OTHER): Payer: Medicare Other | Admitting: Internal Medicine

## 2014-02-13 ENCOUNTER — Encounter: Payer: Self-pay | Admitting: Internal Medicine

## 2014-02-13 VITALS — BP 103/54 | HR 75 | Ht 74.0 in | Wt 202.0 lb

## 2014-02-13 DIAGNOSIS — I509 Heart failure, unspecified: Secondary | ICD-10-CM | POA: Diagnosis not present

## 2014-02-13 DIAGNOSIS — I5022 Chronic systolic (congestive) heart failure: Secondary | ICD-10-CM | POA: Diagnosis not present

## 2014-02-13 DIAGNOSIS — I5042 Chronic combined systolic (congestive) and diastolic (congestive) heart failure: Secondary | ICD-10-CM | POA: Diagnosis not present

## 2014-02-13 DIAGNOSIS — Z95810 Presence of automatic (implantable) cardiac defibrillator: Secondary | ICD-10-CM

## 2014-02-13 DIAGNOSIS — I428 Other cardiomyopathies: Secondary | ICD-10-CM

## 2014-02-13 DIAGNOSIS — I482 Chronic atrial fibrillation, unspecified: Secondary | ICD-10-CM

## 2014-02-13 DIAGNOSIS — I4891 Unspecified atrial fibrillation: Secondary | ICD-10-CM | POA: Diagnosis not present

## 2014-02-13 DIAGNOSIS — I219 Acute myocardial infarction, unspecified: Secondary | ICD-10-CM | POA: Diagnosis not present

## 2014-02-13 LAB — MDC_IDC_ENUM_SESS_TYPE_INCLINIC
Brady Statistic RA Percent Paced: 0 %
Brady Statistic RV Percent Paced: 99.45 %
Date Time Interrogation Session: 20150806204435
HIGH POWER IMPEDANCE MEASURED VALUE: 69.75 Ohm
Implantable Pulse Generator Model: 3257
Implantable Pulse Generator Serial Number: 7053988
Lead Channel Impedance Value: 400 Ohm
Lead Channel Pacing Threshold Amplitude: 0.625 V
Lead Channel Pacing Threshold Pulse Width: 0.5 ms
Lead Channel Setting Pacing Amplitude: 2 V
Lead Channel Setting Pacing Pulse Width: 0.5 ms
Lead Channel Setting Pacing Pulse Width: 0.5 ms
Lead Channel Setting Sensing Sensitivity: 0.5 mV
MDC IDC MSMT BATTERY REMAINING LONGEVITY: 69.6 mo
MDC IDC MSMT LEADCHNL LV IMPEDANCE VALUE: 925 Ohm
MDC IDC MSMT LEADCHNL LV PACING THRESHOLD AMPLITUDE: 1 V
MDC IDC MSMT LEADCHNL LV PACING THRESHOLD PULSEWIDTH: 0.5 ms
MDC IDC MSMT LEADCHNL RV SENSING INTR AMPL: 12 mV
MDC IDC SET LEADCHNL LV PACING AMPLITUDE: 2 V
MDC IDC SET ZONE DETECTION INTERVAL: 270 ms
MDC IDC SET ZONE DETECTION INTERVAL: 300 ms
Zone Setting Detection Interval: 370 ms

## 2014-02-13 MED ORDER — ATORVASTATIN CALCIUM 20 MG PO TABS: 10.0000 mg | ORAL_TABLET | ORAL | Status: DC

## 2014-02-13 NOTE — Assessment & Plan Note (Signed)
His symptoms are class 2. His blood pressure has been running low. I have asked him to stop his isosorbide.

## 2014-02-13 NOTE — Assessment & Plan Note (Signed)
His device is working normally. Will recheck in several months. 

## 2014-02-13 NOTE — Assessment & Plan Note (Signed)
S/p AV node ablation. He is not a coumadin candidate secondary to intracranial hemorrhage on coumadin several years ago. His rate is well controlled.

## 2014-02-13 NOTE — Progress Notes (Signed)
HPI [name] returns today for followup. He is a very pleasant 68 year old man with multiple medical problems including a non-ischemic cardiomyopathy, chronic systolic heart failure, atrial fibrillation, unable to take Coumadin, status post biventricular ICD implantation secondary to complete heart block. In the interim, he has been diagnosed with lung CA and undergone resection. His convalescence has been uneventful.  He continues to walk but in a reduced capacity. He denies chest pain or shortness of breath. No peripheral edema.His blood pressure has been low.  Allergies  Allergen Reactions  . Anticoagulant Compound     Pt had intracranial bleed, therefore all anticoagulation is contra-indicated per Dr. Ron Parker.  . Warfarin Sodium      Current Outpatient Prescriptions  Medication Sig Dispense Refill  . amoxicillin (AMOXIL) 500 MG capsule Take 2,000 mg by mouth See admin instructions. 1 hour prior to dental procedures      . aspirin EC 81 MG tablet Take 1 tablet (81 mg total) by mouth daily.  90 tablet  3  . atorvastatin (LIPITOR) 20 MG tablet Take 0.5 tablets (10 mg total) by mouth daily after supper.  90 tablet  2  . budesonide-formoterol (SYMBICORT) 160-4.5 MCG/ACT inhaler Inhale 2 puffs into the lungs 2 (two) times daily.      . carvedilol (COREG) 3.125 MG tablet TAKE 1 TABLET BY MOUTH TWICE A DAY WITH MEALS  180 tablet  2  . furosemide (LASIX) 40 MG tablet Take 40 mg by mouth daily. May take 1 extra daily as needed for edema      . irbesartan (AVAPRO) 75 MG tablet Take 1 tablet (75 mg total) by mouth daily.  90 tablet  3  . metFORMIN (GLUCOPHAGE) 500 MG tablet Take 500 mg by mouth daily with breakfast.      . nitroGLYCERIN (NITROSTAT) 0.4 MG SL tablet Place 1 tablet (0.4 mg total) under the tongue every 5 (five) minutes as needed. For chest pain  25 tablet  1   No current facility-administered medications for this visit.     Past Medical History  Diagnosis Date  . Hypertension   .  Endocarditis     Bacterial, 2009  . Colon polyps   . Dyslipidemia   . Atrial fibrillation     AV Node ablation January, 2010, for rapid atrial fib  . Cardiomyopathy     non-ischemic  . Ejection fraction < 50%   . Atrial septal defect     Closed with surgery January, 2010  . Mitral regurgitation     Severe symptomatic primary MR due to bacterial endocarditis, treated w/ MVR  . Intracranial hemorrhage     Coumadin cannot be used because of the history of his bleed  . Renal artery stenosis     Mild by history  . Pulmonary hypertension     Moderate  . HLD (hyperlipidemia)   . Automatic implantable cardioverter-defibrillator in situ     LV dysfunction and pacer needed for AV node lesion  . CVA (cerebral vascular accident) 2009    denies residual on 08/14/2013  . CHF (congestive heart failure)   . Myocardial infarction 2010  . Dysrhythmia   . Pre-diabetes   . Pneumonia     "this is my first case" (08/14/2013)  . Spontaneous pneumothorax     right thoracotomy - distant past  . Permanent atrial fibrillation     Originally Coumadin use for atrial fibrillation  //   he had intracerebral hemorrhage with an INR of 2.3 June, 2009. Anticoagulation  could no longer be used.  //  Rapid atrial fibrillation after inferior MI October, 2010..........Marland Kitchen AV node ablation done at that time with ICD pacemaker placed (EF 35%).   //   Left atrial appendage tied off at the time of mitral valve surgery January, 2010 (maze pro  . Status post minimally invasive mitral valve replacement with bioprosthetic valve     33 mm Medtronic Mosaic porcine bioprosthesis placed via right mini thoracotomy for bacterial endocarditis complicated by severe MR and CHF   . Prosthetic valve dysfunction     Mild mitral stenosis  . Chronic combined systolic and diastolic CHF (congestive heart failure)   . Pacemaker   . Diabetes mellitus without complication   . Headache(784.0)     related to stroke only  . Cancer     lung- per  pt.   Marland Kitchen COPD (chronic obstructive pulmonary disease)     O2- 2 liters, nasal cannula, q night   . COPD GOLD II 01/11/2007    PFT's 09/25/13  FEV1  2.18 (61%) ratio 64 no change p B2 and DLC0  32% corrects to 68% - trial off advair and acei rec starting  08/23/2013     . Lung cancer 11/29/2013    T1N0 Stage Ia non-small cell carcinoma left lung treated with wedge resection    ROS:   All systems reviewed and negative except as noted in the HPI.   Past Surgical History  Procedure Laterality Date  . Mitral valve replacement Right 07/17/2008    91mm Medtronic Mosaic porcine bioprosthesis  . Cardiac defibrillator placement  ~ 2010    St Jude  . Penile prosthesis implant    . Appendectomy    . Mass biopsy Left     neck mass  . Leep    . Cataract extraction w/ intraocular lens  implant, bilateral Bilateral   . Asd repair, secundum  07/17/2008    pericardial patch closure of ASD  . Av node ablation  07/2008    for rapid atrial fib  . Thoracotomy Right 1970's    spontaneous pneumothorax - while in the [employer]  . Tonsillectomy    . Insert / replace / remove pacemaker    . Cardiac valve replacement    . Cardiac catheterization    . Video assisted thoracoscopy (vats)/wedge resection Left 11/29/2013    Procedure: Video assisted thoracoscopy for wedge resection; mini thoracotomy;  Surgeon: [name], MD;  Location: [facility] OR;  Service: Thoracic;  Laterality: Left;     Family History  Problem Relation Age of Onset  . Stomach cancer Father   . Cancer Father   . Stroke      family history  . Stroke Mother      History   Social History  . Marital Status: Married    Spouse Name: [name]    Number of Children: 0  . Years of Education: College   Occupational History  . Retired     Tour manager   Social History Main Topics  . Smoking status: Former Smoker -- 1.00 packs/day for 45 years    Types: Cigarettes    Quit date: 08/11/2013  . Smokeless tobacco: Never Used     Comment:  08/14/2013 "quit smoking in 2009"  . Alcohol Use: Yes     Comment: 08/14/2013 "used to drink beer; quit:in 1982"  . Drug Use: No  . Sexual Activity: Yes   Other Topics Concern  . Not on file   Social History  Narrative   Patient lives at home with his spouse.   Caffeine Use: none     BP 103/54  Pulse 75  Ht 6\' 2"  (1.88 m)  Wt 202 lb (91.627 kg)  BMI 25.92 kg/m2  Physical Exam:  Well appearing 68 year old man, NAD HEENT: Unremarkable Neck:  7 cm JVD, no thyromegally Back:  No CVA tenderness Lungs:  Clear with no wheezes, rales, or rhonchi. HEART:  Regular rate rhythm, no murmurs, no rubs, no clicks Abd:  soft, positive bowel sounds, no organomegally, no rebound, no guarding Ext:  2 plus pulses, no edema, no cyanosis, no clubbing Skin:  No rashes no nodules Neuro:  CN II through XII intact, motor grossly intact   DEVICE  Normal device function.  See PaceArt for details.   Assess/Plan:

## 2014-02-13 NOTE — Patient Instructions (Addendum)
Remote monitoring is used to monitor your ICD from home. This monitoring reduces the number of office visits required to check your device to one time per year. It allows [place] to keep an eye on the functioning of your device to ensure it is working properly. You are scheduled for a device check from home on 05-19-2014. You may send your transmission at any time that day. If you have a wireless device, the transmission will be sent automatically. After your physician reviews your transmission, you will receive a postcard with your next transmission date.  Your physician recommends that you schedule a follow-up appointment in: 12 months with Dr.Taylor  Merlin [phone].  Your physician has recommended you make the following change in your medication:  1) Stop Isosorbide 2) take 1/2 Lipitor

## 2014-02-19 ENCOUNTER — Encounter: Payer: Self-pay | Admitting: Internal Medicine

## 2014-04-18 ENCOUNTER — Encounter: Payer: Self-pay | Admitting: Pulmonary Disease

## 2014-04-18 ENCOUNTER — Ambulatory Visit (INDEPENDENT_AMBULATORY_CARE_PROVIDER_SITE_OTHER): Payer: Medicare Other | Admitting: Pulmonary Disease

## 2014-04-18 VITALS — BP 110/64 | HR 94 | Temp 96.8°F | Ht 73.0 in | Wt 203.8 lb

## 2014-04-18 DIAGNOSIS — Z23 Encounter for immunization: Secondary | ICD-10-CM

## 2014-04-18 DIAGNOSIS — J449 Chronic obstructive pulmonary disease, unspecified: Secondary | ICD-10-CM

## 2014-04-18 DIAGNOSIS — I2510 Atherosclerotic heart disease of native coronary artery without angina pectoris: Secondary | ICD-10-CM

## 2014-04-18 DIAGNOSIS — J9610 Chronic respiratory failure, unspecified whether with hypoxia or hypercapnia: Secondary | ICD-10-CM

## 2014-04-18 DIAGNOSIS — C3412 Malignant neoplasm of upper lobe, left bronchus or lung: Secondary | ICD-10-CM | POA: Diagnosis not present

## 2014-04-18 MED ORDER — ALBUTEROL SULFATE HFA 108 (90 BASE) MCG/ACT IN AERS
2.0000 | INHALATION_SPRAY | Freq: Four times a day (QID) | RESPIRATORY_TRACT | Status: DC | PRN
Start: 2014-04-18 — End: 2015-08-25

## 2014-04-18 NOTE — Progress Notes (Signed)
   Subjective:    Patient ID: [name], male    DOB: [birth year], 68 y.o.   MRN: [id number]  HPI 68 year old male smoker with ischemic cardiomyopathy for followup of COPD, hypoxia and lung cancer.  He has a 40+ PY hx of smoking  No discernible benefit from Advair (has been on for several years) , now on Symbicort   Significant tests/ events :  Admitted 08/2013 >> dc'd on O2 Echo 08/2013 -EF 30-35% akinesis of the apex and multiple wall motion abnormalities. RV showed reduced systolic function .  Cath 08/16/13 PCWP 25, Mean RA 11 mmHg  RV 54/5 mmHg  PA: 56/29 mean 39 mmHg  CO: 3.39  He had Chronic appearing pleural disease on right on CT chest & LUL 1.5 cm oblong opacity worrisome for malignancy  PFTs 09/2013 - FEV1 2.18- 61%, ratio 64, FVC 3.40 -71%, DLCO 11.0- 32%  CPET 11/20/13 FEV1 2.28 -58%, MVV 105 (71%), peak V O2 12.7, moderate-to-severe functional limitation due primarily to a circulatory limitation   11/29/13 left VATS with wedge resection of the left upper lobe - Dr. Roxy Manns.-invasive well-differentiated adenocarcinoma , neg LNs, close margins but no involvement  04/18/2014  Chief Complaint  Patient presents with  . Follow-up    Pulmonary nodule; no complaints    Overall he is breathing better, goes without oxygen for short periods, no orhtopnea, PND,no pedal edema  His breathing is doing okay  He desaturates to 87% off oxygen     Review of Systems neg for any significant sore throat, dysphagia, itching, sneezing, nasal congestion or excess/ purulent secretions, fever, chills, sweats, unintended wt loss, pleuritic or exertional cp, hempoptysis, orthopnea pnd or change in chronic leg swelling. Also denies presyncope, palpitations, heartburn, abdominal pain, nausea, vomiting, diarrhea or change in bowel or urinary habits, dysuria,hematuria, rash, arthralgias, visual complaints, headache, numbness weakness or ataxia.     Objective:   Physical Exam Gen. Pleasant,  well-nourished, in no distress ENT - no lesions, no post nasal drip Neck: No JVD, no thyromegaly, no carotid bruits Lungs: no use of accessory muscles, no dullness to percussion, decreased BL without rales or rhonchi  Cardiovascular: Rhythm regular, heart sounds  normal, no murmurs or gallops, no peripheral edema Musculoskeletal: No deformities, no cyanosis or clubbing         Assessment & Plan:

## 2014-04-18 NOTE — Patient Instructions (Signed)
Stay on symbicort We can provide you with albuterol pump to use as needed (RESCUE) Flu shot today

## 2014-04-21 NOTE — Assessment & Plan Note (Signed)
71m surveillance CT scheduled

## 2014-04-21 NOTE — Assessment & Plan Note (Signed)
Stay on symbicort We can provide you with albuterol pump to use as needed (RESCUE) Flu shot today Rpt spirometry for post op FEV1 at some point

## 2014-04-22 ENCOUNTER — Other Ambulatory Visit (INDEPENDENT_AMBULATORY_CARE_PROVIDER_SITE_OTHER): Payer: Medicare Other

## 2014-04-22 DIAGNOSIS — E089 Diabetes mellitus due to underlying condition without complications: Secondary | ICD-10-CM | POA: Diagnosis not present

## 2014-04-22 LAB — BASIC METABOLIC PANEL
BUN: 21 mg/dL (ref 6–23)
CHLORIDE: 101 meq/L (ref 96–112)
CO2: 32 meq/L (ref 19–32)
CREATININE: 1 mg/dL (ref 0.4–1.5)
Calcium: 9.3 mg/dL (ref 8.4–10.5)
GFR: 75.74 mL/min (ref >60.00)
GLUCOSE: 165 mg/dL — AB (ref 70–99)
Potassium: 5.3 mEq/L — ABNORMAL HIGH (ref 3.5–5.1)
Sodium: 139 mEq/L (ref 135–145)

## 2014-04-22 LAB — MICROALBUMIN / CREATININE URINE RATIO
CREATININE, U: 36.6 mg/dL
Microalb Creat Ratio: 6.8 mg/g (ref 0.0–30.0)
Microalb, Ur: 2.5 mg/dL — ABNORMAL HIGH (ref 0.0–1.9)

## 2014-04-22 LAB — HEMOGLOBIN A1C: Hgb A1c MFr Bld: 7.2 % — ABNORMAL HIGH (ref 4.6–6.5)

## 2014-04-29 ENCOUNTER — Encounter: Payer: Self-pay | Admitting: Family Medicine

## 2014-04-29 ENCOUNTER — Ambulatory Visit (INDEPENDENT_AMBULATORY_CARE_PROVIDER_SITE_OTHER): Payer: Medicare Other | Admitting: Family Medicine

## 2014-04-29 VITALS — BP 110/70 | HR 85 | Temp 97.8°F | Wt 203.6 lb

## 2014-04-29 DIAGNOSIS — I2510 Atherosclerotic heart disease of native coronary artery without angina pectoris: Secondary | ICD-10-CM

## 2014-04-29 DIAGNOSIS — E119 Type 2 diabetes mellitus without complications: Secondary | ICD-10-CM

## 2014-04-29 MED ORDER — METFORMIN HCL 500 MG PO TABS: ORAL_TABLET | ORAL | Status: DC

## 2014-04-29 NOTE — Progress Notes (Signed)
   Subjective:    Patient ID: [name], male    DOB: 01-Jun-1946, 67 y.o.   MRN: [id number]  HPI  [name]. 68 year old male he's had a history of COPD recent wedge resection for lung cancer who still smokes who comes in today for followup of diabetes  He's on metformin 500 mg once daily in the morning. Blood sugar a week ago was 165 with an A1c of 7.2% review of systems otherwise negative  Review of Systems     Objective:   Physical Exam Well-developed well-nourished male in no acute distress vital signs stable he is afebrile BP 110/70 he's got O2 continuously however he takes his oxygen off and smokes       Assessment & Plan:  Diabetes type 2.........Marland Kitchen

## 2014-04-29 NOTE — Progress Notes (Signed)
Pre visit review using our clinic review tool, if applicable. No additional management support is needed unless otherwise documented below in the visit note. Lab Results  Component Value Date   HGBA1C 7.2* 04/22/2014   HGBA1C 7.3* 01/16/2014   HGBA1C 7.6* 10/28/2013   Lab Results  Component Value Date   MICROALBUR 2.5* 04/22/2014   LDLCALC 39 07/01/2013   CREATININE 1.0 04/22/2014

## 2014-04-29 NOTE — Patient Instructions (Signed)
Metformin 500 mg.......Marland Kitchen 1 in the morning with breakfast............ half a tablet 3 male  Followup office visit in 3 months  Labs one week prior

## 2014-05-19 ENCOUNTER — Ambulatory Visit (INDEPENDENT_AMBULATORY_CARE_PROVIDER_SITE_OTHER): Payer: Medicare Other | Admitting: *Deleted

## 2014-05-19 DIAGNOSIS — I5022 Chronic systolic (congestive) heart failure: Secondary | ICD-10-CM

## 2014-05-19 DIAGNOSIS — I429 Cardiomyopathy, unspecified: Secondary | ICD-10-CM | POA: Diagnosis not present

## 2014-05-19 DIAGNOSIS — I428 Other cardiomyopathies: Secondary | ICD-10-CM

## 2014-05-19 NOTE — Progress Notes (Signed)
Remote ICD transmission.   

## 2014-05-20 ENCOUNTER — Encounter: Payer: Self-pay | Admitting: Internal Medicine

## 2014-05-20 LAB — MDC_IDC_ENUM_SESS_TYPE_REMOTE
Battery Remaining Longevity: 70 mo
Battery Remaining Percentage: 75 %
Battery Voltage: 2.96 V
Date Time Interrogation Session: 20151109070015
HIGH POWER IMPEDANCE MEASURED VALUE: 77 Ohm
HIGH POWER IMPEDANCE MEASURED VALUE: 77 Ohm
Implantable Pulse Generator Model: 3257
Implantable Pulse Generator Serial Number: 7053988
Lead Channel Impedance Value: 410 Ohm
Lead Channel Pacing Threshold Amplitude: 0.625 V
Lead Channel Pacing Threshold Amplitude: 1 V
Lead Channel Pacing Threshold Pulse Width: 0.5 ms
Lead Channel Pacing Threshold Pulse Width: 0.5 ms
Lead Channel Setting Pacing Amplitude: 2 V
Lead Channel Setting Pacing Pulse Width: 0.5 ms
Lead Channel Setting Sensing Sensitivity: 0.5 mV
MDC IDC MSMT LEADCHNL LV IMPEDANCE VALUE: 990 Ohm
MDC IDC SET LEADCHNL LV PACING AMPLITUDE: 2 V
MDC IDC SET LEADCHNL LV PACING PULSEWIDTH: 0.5 ms
MDC IDC SET ZONE DETECTION INTERVAL: 300 ms
Zone Setting Detection Interval: 270 ms
Zone Setting Detection Interval: 370 ms

## 2014-05-28 ENCOUNTER — Encounter: Payer: Self-pay | Admitting: Cardiology

## 2014-06-10 ENCOUNTER — Ambulatory Visit (INDEPENDENT_AMBULATORY_CARE_PROVIDER_SITE_OTHER): Payer: Medicare Other | Admitting: Pulmonary Disease

## 2014-06-10 ENCOUNTER — Encounter: Payer: Self-pay | Admitting: Pulmonary Disease

## 2014-06-10 VITALS — BP 100/62 | HR 72 | Temp 96.1°F | Ht 73.0 in | Wt 210.8 lb

## 2014-06-10 DIAGNOSIS — J449 Chronic obstructive pulmonary disease, unspecified: Secondary | ICD-10-CM | POA: Diagnosis not present

## 2014-06-10 DIAGNOSIS — I2510 Atherosclerotic heart disease of native coronary artery without angina pectoris: Secondary | ICD-10-CM

## 2014-06-10 MED ORDER — AZITHROMYCIN 250 MG PO TABS: ORAL_TABLET | ORAL | Status: DC

## 2014-06-10 NOTE — Progress Notes (Signed)
   Subjective:    Patient ID: [name], male    DOB: [birth year], 68 y.o.   MRN: [id number]  HPI  68 year old male smoker with ischemic cardiomyopathy for followup of COPD, hypoxia and lung cancer.  He has a 40+ PY hx of smoking  No discernible benefit from Advair (was on for several years) , now on Symbicort   Significant tests/ events :  Admitted 08/2013 >> dc'd on O2 Echo 08/2013 -EF 30-35% akinesis of the apex and multiple wall motion abnormalities. RV showed reduced systolic function .  Cath 08/16/13 PCWP 25, Mean RA 11 mmHg  RV 54/5 mmHg  PA: 56/29 mean 39 mmHg  CO: 3.39  He had  pleural disease on right on CT chest & LUL 1.5 cm oblong opacity worrisome for malignancy  PFTs 09/2013 - FEV1 2.18- 61%, ratio 64, FVC 3.40 -71%, DLCO 11.0- 32%  CPET 11/20/13 FEV1 2.28 -58%, MVV 105 (71%), peak V O2 12.7, moderate-to-severe functional limitation due primarily to a circulatory limitation   11/29/13 left VATS with wedge resection of the left upper lobe - Dr. Roxy Manns.-invasive well-differentiated adenocarcinoma , neg LNs, close margins but no involvement   06/10/2014  Chief Complaint  Patient presents with  . Acute Visit    sore throat, nose runny, cough, eyes watery, gets chills at night   Wife sick with same complaints -got flu shot last visit Cough with minimal green phlegm no orthopnea, PND,no pedal edema  His breathing is doing okay  He is compliant with oxygen FU CT scheduled for mid dec     Review of Systems neg for any significant sore throat, dysphagia, itching, sneezing, nasal congestion or excess/ purulent secretions, fever, chills, sweats, unintended wt loss, pleuritic or exertional cp, hempoptysis, orthopnea pnd or change in chronic leg swelling. Also denies presyncope, palpitations, heartburn, abdominal pain, nausea, vomiting, diarrhea or change in bowel or urinary habits, dysuria,hematuria, rash, arthralgias, visual complaints, headache, numbness weakness or ataxia.     Objective:   Physical Exam  Gen. Pleasant, well-nourished, in no distress ENT - no lesions, no post nasal drip Neck: No JVD, no thyromegaly, no carotid bruits Lungs: no use of accessory muscles, no dullness to percussion, clear without rales or rhonchi  Cardiovascular: Rhythm regular, heart sounds  normal, no murmurs or gallops, no peripheral edema Musculoskeletal: No deformities, no cyanosis or clubbing         Assessment & Plan:

## 2014-06-10 NOTE — Assessment & Plan Note (Signed)
Treat as acute bronchitis Zpak No bspasm - so does not need steroids Call if no better by Friday

## 2014-06-10 NOTE — Patient Instructions (Signed)
Zpak Mucinex for cough Tylenol for fever - plenty of oral fluids Call if no better by Friday

## 2014-06-19 ENCOUNTER — Encounter (HOSPITAL_COMMUNITY): Payer: Self-pay | Admitting: Internal Medicine

## 2014-06-21 ENCOUNTER — Other Ambulatory Visit: Payer: Self-pay | Admitting: Pulmonary Disease

## 2014-06-23 ENCOUNTER — Other Ambulatory Visit (HOSPITAL_BASED_OUTPATIENT_CLINIC_OR_DEPARTMENT_OTHER): Payer: Medicare Other

## 2014-06-23 ENCOUNTER — Encounter (HOSPITAL_COMMUNITY): Payer: Self-pay

## 2014-06-23 ENCOUNTER — Ambulatory Visit (HOSPITAL_COMMUNITY): Payer: Medicare Other

## 2014-06-23 ENCOUNTER — Ambulatory Visit (HOSPITAL_COMMUNITY)
Admission: RE | Admit: 2014-06-23 | Discharge: 2014-06-23 | Disposition: A | Discharge status: Home / Self Care | Payer: Medicare Other | Source: Ambulatory Visit | Attending: Internal Medicine | Admitting: Internal Medicine

## 2014-06-23 DIAGNOSIS — C3492 Malignant neoplasm of unspecified part of left bronchus or lung: Secondary | ICD-10-CM | POA: Diagnosis not present

## 2014-06-23 DIAGNOSIS — I255 Ischemic cardiomyopathy: Secondary | ICD-10-CM | POA: Diagnosis not present

## 2014-06-23 DIAGNOSIS — C3490 Malignant neoplasm of unspecified part of unspecified bronchus or lung: Secondary | ICD-10-CM

## 2014-06-23 DIAGNOSIS — J438 Other emphysema: Secondary | ICD-10-CM | POA: Diagnosis not present

## 2014-06-23 DIAGNOSIS — R0902 Hypoxemia: Secondary | ICD-10-CM | POA: Insufficient documentation

## 2014-06-23 DIAGNOSIS — J432 Centrilobular emphysema: Secondary | ICD-10-CM | POA: Diagnosis not present

## 2014-06-23 DIAGNOSIS — J449 Chronic obstructive pulmonary disease, unspecified: Secondary | ICD-10-CM | POA: Insufficient documentation

## 2014-06-23 DIAGNOSIS — C3410 Malignant neoplasm of upper lobe, unspecified bronchus or lung: Secondary | ICD-10-CM | POA: Diagnosis not present

## 2014-06-23 DIAGNOSIS — I517 Cardiomegaly: Secondary | ICD-10-CM | POA: Diagnosis not present

## 2014-06-23 DIAGNOSIS — I2510 Atherosclerotic heart disease of native coronary artery without angina pectoris: Secondary | ICD-10-CM | POA: Diagnosis not present

## 2014-06-23 LAB — COMPREHENSIVE METABOLIC PANEL (CC13)
ALT: 20 U/L (ref 0–55)
AST: 16 U/L (ref 5–34)
Albumin: 4.1 g/dL (ref 3.5–5.0)
Alkaline Phosphatase: 70 U/L (ref 40–150)
Anion Gap: 12 mEq/L — ABNORMAL HIGH (ref 3–11)
BUN: 22.2 mg/dL (ref 7.0–26.0)
CHLORIDE: 100 meq/L (ref 98–109)
CO2: 28 mEq/L (ref 22–29)
CREATININE: 1 mg/dL (ref 0.7–1.3)
Calcium: 9.4 mg/dL (ref 8.4–10.4)
EGFR: 74 mL/min/{1.73_m2} — ABNORMAL LOW (ref >90)
GLUCOSE: 147 mg/dL — AB (ref 70–140)
Potassium: 4.6 mEq/L (ref 3.5–5.1)
Sodium: 140 mEq/L (ref 136–145)
Total Bilirubin: 3.48 mg/dL — ABNORMAL HIGH (ref 0.20–1.20)
Total Protein: 7.1 g/dL (ref 6.4–8.3)

## 2014-06-23 LAB — CBC WITH DIFFERENTIAL/PLATELET
BASO%: 1.3 % (ref 0.0–2.0)
Basophils Absolute: 0.1 10*3/uL (ref 0.0–0.1)
EOS ABS: 0.2 10*3/uL (ref 0.0–0.5)
EOS%: 2.4 % (ref 0.0–7.0)
HCT: 48 % (ref 38.4–49.9)
HGB: 15.7 g/dL (ref 13.0–17.1)
LYMPH%: 10.9 % — ABNORMAL LOW (ref 14.0–49.0)
MCH: 30.4 pg (ref 27.2–33.4)
MCHC: 32.6 g/dL (ref 32.0–36.0)
MCV: 93 fL (ref 79.3–98.0)
MONO#: 0.8 10*3/uL (ref 0.1–0.9)
MONO%: 8.1 % (ref 0.0–14.0)
NEUT%: 77.3 % — ABNORMAL HIGH (ref 39.0–75.0)
NEUTROS ABS: 7.4 10*3/uL — AB (ref 1.5–6.5)
Platelets: 185 10*3/uL (ref 140–400)
RBC: 5.16 10*6/uL (ref 4.20–5.82)
RDW: 15.5 % — ABNORMAL HIGH (ref 11.0–14.6)
WBC: 9.6 10*3/uL (ref 4.0–10.3)
lymph#: 1 10*3/uL (ref 0.9–3.3)

## 2014-06-25 ENCOUNTER — Telehealth: Payer: Self-pay | Admitting: Family Medicine

## 2014-06-25 NOTE — Telephone Encounter (Signed)
Pt states Dr Elsworth Soho has not responded to his rx request for budesonide-formoterol (SYMBICORT) 160-4.5 MCG/ACT inhaler and wants to know if dr todd will refill this for him.  [facility] [address]  Pt requested this 12/12 and has not heard form their office,

## 2014-06-26 MED ORDER — BUDESONIDE-FORMOTEROL FUMARATE 160-4.5 MCG/ACT IN AERO: INHALATION_SPRAY | RESPIRATORY_TRACT | Status: DC

## 2014-06-30 ENCOUNTER — Ambulatory Visit (HOSPITAL_BASED_OUTPATIENT_CLINIC_OR_DEPARTMENT_OTHER): Payer: Medicare Other | Admitting: Internal Medicine

## 2014-06-30 ENCOUNTER — Encounter: Payer: Self-pay | Admitting: Internal Medicine

## 2014-06-30 ENCOUNTER — Telehealth: Payer: Self-pay | Admitting: Internal Medicine

## 2014-06-30 VITALS — BP 159/64 | HR 116 | Temp 97.8°F | Resp 18 | Ht 73.0 in | Wt 208.4 lb

## 2014-06-30 DIAGNOSIS — I10 Essential (primary) hypertension: Secondary | ICD-10-CM

## 2014-06-30 DIAGNOSIS — R17 Unspecified jaundice: Secondary | ICD-10-CM

## 2014-06-30 DIAGNOSIS — C3412 Malignant neoplasm of upper lobe, left bronchus or lung: Secondary | ICD-10-CM | POA: Diagnosis not present

## 2014-06-30 NOTE — Progress Notes (Signed)
[facility] [phone]   [phone]  OFFICE PROGRESS NOTE  Joycelyn Man, MD [place] [address]  DIAGNOSIS: Stage IA (T1a, N0, M0) non-small cell lung cancer, well-differentiated adenocarcinoma diagnosed in February of 2015   PRIOR THERAPY: Status post wedge resection of the left upper lobe under the care of Dr. Roxy Manns on 11/29/2013.  CURRENT THERAPY: Observation.  INTERVAL HISTORY: [name] 68 y.o. male returns to the clinic today for six-month follow-up visit accompanied by his wife. The patient is feeling fine today with no specific complaints except for the baseline shortness of breath and he is currently on home oxygen. He denied having any significant weight loss or night sweats. The patient denied having any nausea, vomiting, abdominal pain, diarrhea or constipation. He has no chest pain, cough or hemoptysis. His serum bilirubin has been elevated over the last few years with unclear etiology. The patient has repeat CT scan of the chest performed recently and he is here for evaluation and discussion of his scan results.  MEDICAL HISTORY: Past Medical History  Diagnosis Date  . Hypertension   . Endocarditis     Bacterial, 2009  . Colon polyps   . Dyslipidemia   . Atrial fibrillation     AV Node ablation January, 2010, for rapid atrial fib  . Cardiomyopathy     non-ischemic  . Ejection fraction < 50%   . Atrial septal defect     Closed with surgery January, 2010  . Mitral regurgitation     Severe symptomatic primary MR due to bacterial endocarditis, treated w/ MVR  . Intracranial hemorrhage     Coumadin cannot be used because of the history of his bleed  . Renal artery stenosis     Mild by history  . Pulmonary hypertension     Moderate  . HLD (hyperlipidemia)   . Automatic implantable cardioverter-defibrillator in situ     LV dysfunction and pacer needed for AV node lesion  . CVA (cerebral  vascular accident) 2009    denies residual on 08/14/2013  . CHF (congestive heart failure)   . Myocardial infarction 2010  . Dysrhythmia   . Pre-diabetes   . Pneumonia     "this is my first case" (08/14/2013)  . Spontaneous pneumothorax     right thoracotomy - distant past  . Permanent atrial fibrillation     Originally Coumadin use for atrial fibrillation  //   he had intracerebral hemorrhage with an INR of 2.3 June, 2009. Anticoagulation could no longer be used.  //  Rapid atrial fibrillation after inferior MI October, 2010..........Marland Kitchen AV node ablation done at that time with ICD pacemaker placed (EF 35%).   //   Left atrial appendage tied off at the time of mitral valve surgery January, 2010 (maze pro  . Status post minimally invasive mitral valve replacement with bioprosthetic valve     33 mm Medtronic Mosaic porcine bioprosthesis placed via right mini thoracotomy for bacterial endocarditis complicated by severe MR and CHF   . Prosthetic valve dysfunction     Mild mitral stenosis  . Chronic combined systolic and diastolic CHF (congestive heart failure)   . Pacemaker   . Diabetes mellitus without complication   . Headache(784.0)     related to stroke only  . COPD (chronic obstructive pulmonary disease)     O2- 2 liters, nasal cannula, q night   . COPD GOLD II 01/11/2007    PFT's  09/25/13  FEV1  2.18 (61%) ratio 64 no change p B2 and DLC0  32% corrects to 68% - trial off advair and acei rec starting  08/23/2013     . Cancer     lung- per pt.   . Lung cancer 11/29/2013    T1N0 Stage Ia non-small cell carcinoma left lung treated with wedge resection    ALLERGIES:  is allergic to anticoagulant compound and warfarin sodium.  MEDICATIONS:  Current Outpatient Prescriptions  Medication Sig Dispense Refill  . albuterol (PROVENTIL HFA;VENTOLIN HFA) 108 (90 BASE) MCG/ACT inhaler Inhale 2 puffs into the lungs every 6 (six) hours as needed for wheezing or shortness of breath. 1 Inhaler 6  . aspirin  EC 81 MG tablet Take 1 tablet (81 mg total) by mouth daily. 90 tablet 3  . atorvastatin (LIPITOR) 20 MG tablet Take 0.5 tablets (10 mg total) by mouth daily after supper. (Patient taking differently: Take 10 mg by mouth daily after supper. 1 tablet in AM, 1/2 tablet in PM) 90 tablet 2  . budesonide-formoterol (SYMBICORT) 160-4.5 MCG/ACT inhaler Inhale 2 puffs into the lungs 2 (two) times daily.    . carvedilol (COREG) 3.125 MG tablet TAKE 1 TABLET BY MOUTH TWICE A DAY WITH MEALS 180 tablet 2  . furosemide (LASIX) 40 MG tablet Take 40 mg by mouth daily. May take 1 extra daily as needed for edema    . irbesartan (AVAPRO) 75 MG tablet Take 1 tablet (75 mg total) by mouth daily. 90 tablet 3  . metFORMIN (GLUCOPHAGE) 500 MG tablet When the morning with breakfast and one half tablet with your evening meal 150 tablet 3  . amoxicillin (AMOXIL) 500 MG capsule Take 2,000 mg by mouth See admin instructions. 1 hour prior to dental procedures    . nitroGLYCERIN (NITROSTAT) 0.4 MG SL tablet Place 1 tablet (0.4 mg total) under the tongue every 5 (five) minutes as needed. For chest pain (Patient not taking: Reported on 06/30/2014) 25 tablet 1   No current facility-administered medications for this visit.    SURGICAL HISTORY:  Past Surgical History  Procedure Laterality Date  . Mitral valve replacement Right 07/17/2008    88mm Medtronic Mosaic porcine bioprosthesis  . Cardiac defibrillator placement  ~ 2010    [employer] Jude  . Penile prosthesis implant    . Appendectomy    . Mass biopsy Left     neck mass  . Leep    . Cataract extraction w/ intraocular lens  implant, bilateral Bilateral   . Asd repair, secundum  07/17/2008    pericardial patch closure of ASD  . Av node ablation  07/2008    for rapid atrial fib  . Thoracotomy Right 1970's    spontaneous pneumothorax - while in the [employer]  . Tonsillectomy    . Insert / replace / remove pacemaker    . Cardiac valve replacement    . Cardiac catheterization      . Video assisted thoracoscopy (vats)/wedge resection Left 11/29/2013    Procedure: Video assisted thoracoscopy for wedge resection; mini thoracotomy;  Surgeon: [name], MD;  Location: [facility];  Service: Thoracic;  Laterality: Left;  . Implantable cardioverter defibrillator (icd) generator change N/A 02/06/2012    Procedure: ICD GENERATOR CHANGE;  Surgeon: [name], MD;  Location: [facility] CATH LAB;  Service: Cardiovascular;  Laterality: N/A;  . Right heart catheterization N/A 08/16/2013    Procedure: RIGHT HEART CATH;  Surgeon: Josue Hector, MD;  Location: [facility]  CATH LAB;  Service: Cardiovascular;  Laterality: N/A;    REVIEW OF SYSTEMS:  A comprehensive review of systems was negative except for: Respiratory: positive for dyspnea on exertion   PHYSICAL EXAMINATION: General appearance: alert, cooperative, fatigued and no distress Head: Normocephalic, without obvious abnormality, atraumatic Neck: no adenopathy, no JVD, supple, symmetrical, trachea midline and thyroid not enlarged, symmetric, no tenderness/mass/nodules Lymph nodes: Cervical, supraclavicular, and axillary nodes normal. Resp: clear to auscultation bilaterally Back: negative, symmetric, no curvature. ROM normal. No CVA tenderness. Cardio: regular rate and rhythm, S1, S2 normal, no murmur, click, rub or gallop GI: soft, non-tender; bowel sounds normal; no masses,  no organomegaly Extremities: extremities normal, atraumatic, no cyanosis or edema  ECOG PERFORMANCE STATUS: 1 - Symptomatic but completely ambulatory  Blood pressure 159/64, pulse 116, temperature 97.8 F (36.6 C), temperature source Oral, resp. rate 18, height 6\' 1"  (1.854 m), weight 208 lb 6.4 oz (94.53 kg), SpO2 98 %.  LABORATORY DATA: Lab Results  Component Value Date   WBC 9.6 06/23/2014   HGB 15.7 06/23/2014   HCT 48.0 06/23/2014   MCV 93.0 06/23/2014   PLT 185 06/23/2014      Chemistry      Component Value Date/Time   NA 140 06/23/2014 1359   NA 139  04/22/2014 0808   K 4.6 06/23/2014 1359   K 5.3* 04/22/2014 0808   CL 101 04/22/2014 0808   CO2 28 06/23/2014 1359   CO2 32 04/22/2014 0808   BUN 22.2 06/23/2014 1359   BUN 21 04/22/2014 0808   CREATININE 1.0 06/23/2014 1359   CREATININE 1.0 04/22/2014 0808      Component Value Date/Time   CALCIUM 9.4 06/23/2014 1359   CALCIUM 9.3 04/22/2014 0808   ALKPHOS 70 06/23/2014 1359   ALKPHOS 52 12/01/2013 0400   AST 16 06/23/2014 1359   AST 16 12/01/2013 0400   ALT 20 06/23/2014 1359   ALT 10 12/01/2013 0400   BILITOT 3.48* 06/23/2014 1359   BILITOT 2.6* 12/01/2013 0400       RADIOGRAPHIC STUDIES: Ct Chest Wo Contrast  06/23/2014   CLINICAL DATA:  68 year old male with history of ischemic cardiomyopathy and COPD, presenting with history of hypoxia and lung cancer. Restaging examination.  EXAM: CT CHEST WITHOUT CONTRAST  TECHNIQUE: Multidetector CT imaging of the chest was performed following the standard protocol without IV contrast.  COMPARISON:  PET-CT 10/29/2013.  FINDINGS: Mediastinum: Heart size is moderately enlarged with biatrial dilatation. There is no significant pericardial fluid, thickening or pericardial calcification. There is atherosclerosis of the thoracic aorta, the great vessels of the mediastinum and the coronary arteries, including calcified atherosclerotic plaque in the left anterior descending, left circumflex and right coronary arteries. Status post mitral valve replacement with a bioprosthesis. Right-sided biventricular pacemaker/AICD in position with lead tips terminating in the right ventricular apex and overlying the inferolateral wall of the left ventricle via the coronary sinus and coronary veins. No pathologically enlarged mediastinal or hilar lymph nodes. Please note that accurate exclusion of hilar adenopathy is limited on noncontrast CT scans. Esophagus is unremarkable in appearance.  Lungs/Pleura: Compared to the prior examination there has been interval wedge  resection in the apex of the left upper lobe. No definite soft tissue nodularity along the suture line to suggest local recurrence of disease. No other suspicious appearing pulmonary nodules or masses are noted in the lungs at this time. Several tiny calcified pulmonary nodules are seen in the left lower lobe, compatible with calcified granulomas. Mild to  moderate centrilobular and paraseptal emphysema, most evident the lung apices. Focal area of architectural distortion in the inferior segment of the lingula is compatible with chronic scarring and is unchanged. No significant pleural effusions.  Upper Abdomen: The liver has a shrunken appearance and nodular contour, with expansion of the preportal fat, and expansion of fat in the fissure for the ligamentum venosum, suggestive of underlying cirrhosis.  Musculoskeletal: There are no aggressive appearing lytic or blastic lesions noted in the visualized portions of the skeleton.  IMPRESSION: 1. Status post wedge resection in the apex of the left upper lobe. No definite findings to suggest local recurrence of disease or metastatic disease in the chest on today's examination. 2. Mild diffuse bronchial wall thickening with moderate centrilobular and paraseptal emphysema; imaging findings compatible with report history of COPD. 3. Atherosclerosis, including three-vessel coronary artery disease. Please note that although the presence of coronary artery calcium documents the presence of coronary artery disease, the severity of this disease and any potential stenosis cannot be assessed on this non-gated CT examination. Assessment for potential risk factor modification, dietary therapy or pharmacologic therapy may be warranted, if clinically indicated. 4. Cardiomegaly with biatrial dilatation again noted. 5. The appearance of the liver suggests underlying cirrhosis, as above.   Electronically Signed   By: Vinnie Langton M.D.   On: 06/23/2014 15:05    ASSESSMENT AND PLAN:  This is a very pleasant 68 years old white male recently diagnosed with a stage IA non-small cell lung cancer status post wedge resection of the left upper lobe and he is currently on observation. The recent CT scan of the chest showed no evidence for disease recurrence. I discussed the scan results with the patient and his wife. I recommended for him to continue on observation with repeat CT scan of the chest in 6 months. The patient has elevated serum bilirubin for the last few years and I'm not sure of the etiology but this could be secondary to hemolysis in a patient with mitral valve replacement. His other liver enzymes are unremarkable which is clearly against an obstructive or destructive liver disease. I gave the patient a copy of his blood work to share with his primary care physician to see if he has any other explanation for his persistent serum bilirubin over the last few years. The patient would come back for follow-up visit in 6 months after repeating CT scan of the chest. He was advised to call immediately if he has any concerning symptoms in the interval. The patient voices understanding of current disease status and treatment options and is in agreement with the current care plan.  All questions were answered. The patient knows to call the clinic with any problems, questions or concerns. We can certainly see the patient much sooner if necessary.  Disclaimer: This note was dictated with voice recognition software. Similar sounding words can inadvertently be transcribed and may not be corrected upon review.

## 2014-06-30 NOTE — Telephone Encounter (Signed)
Gave avs & cal for June 2016.

## 2014-07-07 ENCOUNTER — Telehealth (HOSPITAL_COMMUNITY): Payer: Self-pay

## 2014-07-07 NOTE — Telephone Encounter (Signed)
Called patient regarding pulmonary rehab. Patient states he is not interested in the program.

## 2014-07-22 ENCOUNTER — Other Ambulatory Visit (INDEPENDENT_AMBULATORY_CARE_PROVIDER_SITE_OTHER): Payer: Medicare Other

## 2014-07-22 DIAGNOSIS — E119 Type 2 diabetes mellitus without complications: Secondary | ICD-10-CM | POA: Diagnosis not present

## 2014-07-22 LAB — HEMOGLOBIN A1C: Hgb A1c MFr Bld: 7.4 % — ABNORMAL HIGH (ref 4.6–6.5)

## 2014-07-22 LAB — BASIC METABOLIC PANEL
BUN: 22 mg/dL (ref 6–23)
CALCIUM: 9 mg/dL (ref 8.4–10.5)
CO2: 33 mEq/L — ABNORMAL HIGH (ref 19–32)
Chloride: 101 mEq/L (ref 96–112)
Creatinine, Ser: 0.9 mg/dL (ref 0.4–1.5)
GFR: 90.58 mL/min (ref >60.00)
Glucose, Bld: 169 mg/dL — ABNORMAL HIGH (ref 70–99)
Potassium: 4.4 mEq/L (ref 3.5–5.1)
Sodium: 137 mEq/L (ref 135–145)

## 2014-07-29 ENCOUNTER — Ambulatory Visit: Payer: Medicare Other | Admitting: Family Medicine

## 2014-07-30 ENCOUNTER — Encounter: Payer: Self-pay | Admitting: Family Medicine

## 2014-07-30 ENCOUNTER — Ambulatory Visit (INDEPENDENT_AMBULATORY_CARE_PROVIDER_SITE_OTHER): Payer: Medicare Other | Admitting: Family Medicine

## 2014-07-30 VITALS — BP 124/70 | HR 78 | Temp 97.9°F | Wt 212.0 lb

## 2014-07-30 DIAGNOSIS — E119 Type 2 diabetes mellitus without complications: Secondary | ICD-10-CM

## 2014-07-30 DIAGNOSIS — R17 Unspecified jaundice: Secondary | ICD-10-CM | POA: Diagnosis not present

## 2014-07-30 MED ORDER — METFORMIN HCL 500 MG PO TABS: ORAL_TABLET | ORAL | Status: DC

## 2014-07-30 MED ORDER — ATORVASTATIN CALCIUM 10 MG PO TABS: 10.0000 mg | ORAL_TABLET | Freq: Every day | ORAL | Status: DC

## 2014-07-30 NOTE — Progress Notes (Signed)
   Subjective:    Patient ID: [name], male    DOB: 1946/04/19, 69 y.o.   MRN: [id number]  HPI [name] is a 69 year old male who has numerous underlying cardiac and pulmonary problems who comes in today for follow-up of diabetes  He's on metformin 500 mg in the morning and 250 mg prior to his evening meal. Blood sugar in the 169 range A1c 7.4%  He states his bilirubin has been elevated for the past couple months. 6 months ago was 2.24 week ago was 3.48. He states his oncologist is concerned and wants it evaluated. Liver function studies are all normal.  He is splitting the 20 mg of Lipitor but has difficulty doing that. Will prescribe the 10 mg tablet   Review of Systems    review of systems otherwise negative no hypoglycemia Objective:   Physical Exam  Well-developed well-nourished male no acute distress vital signs stable he is afebrile. He has oxygen on continuous      Assessment & Plan:  Diabetes type 2 not at goal,,,,,,, increase metformin to 500 mg twice a day  Elevated bilirubin,,,,,,,, with normal liver functions,,,,,, I'm not sure I would pursue this,,,,, however patient would like this persisted,,,,,, therefore referred to GI for evaluation  Elevated cholesterol,,,,,,,,,,,, Lipitor 10 mg daily

## 2014-07-30 NOTE — Progress Notes (Signed)
Pre visit review using our clinic review tool, if applicable. No additional management support is needed unless otherwise documented below in the visit note. Lab Results  Component Value Date   HGBA1C 7.4* 07/22/2014   HGBA1C 7.2* 04/22/2014   HGBA1C 7.3* 01/16/2014   Lab Results  Component Value Date   MICROALBUR 2.5* 04/22/2014   LDLCALC 39 07/01/2013   CREATININE 0.9 07/22/2014

## 2014-07-30 NOTE — Patient Instructions (Signed)
Metformin 500 mg,,,,,,,, one in the morning........ One prior to your evening meal  Lipitor 10 mg.........Marland Kitchen 1 tablet at bedtime  Follow-up office visit in 3 months  Nonfasting labs one week prior

## 2014-08-05 ENCOUNTER — Ambulatory Visit (INDEPENDENT_AMBULATORY_CARE_PROVIDER_SITE_OTHER): Payer: Medicare Other | Admitting: Physician Assistant

## 2014-08-05 ENCOUNTER — Encounter: Payer: Self-pay | Admitting: Physician Assistant

## 2014-08-05 ENCOUNTER — Other Ambulatory Visit (INDEPENDENT_AMBULATORY_CARE_PROVIDER_SITE_OTHER): Payer: Medicare Other

## 2014-08-05 VITALS — BP 104/60 | HR 72 | Ht 72.0 in | Wt 212.5 lb

## 2014-08-05 DIAGNOSIS — K7460 Unspecified cirrhosis of liver: Secondary | ICD-10-CM

## 2014-08-05 DIAGNOSIS — R17 Unspecified jaundice: Secondary | ICD-10-CM

## 2014-08-05 DIAGNOSIS — Z86010 Personal history of colonic polyps: Secondary | ICD-10-CM

## 2014-08-05 DIAGNOSIS — Z1211 Encounter for screening for malignant neoplasm of colon: Secondary | ICD-10-CM

## 2014-08-05 LAB — HEPATITIS C ANTIBODY: HCV Ab: NEGATIVE

## 2014-08-05 LAB — AFP TUMOR MARKER: AFP-Tumor Marker: 2.2 ng/mL (ref <6.1)

## 2014-08-05 LAB — PROTIME-INR
INR: 1.1 ratio — ABNORMAL HIGH (ref 0.8–1.0)
Prothrombin Time: 11.7 s (ref 9.6–13.1)

## 2014-08-05 LAB — HEPATITIS B SURFACE ANTIBODY,QUALITATIVE: Hep B S Ab: NEGATIVE

## 2014-08-05 LAB — HEPATITIS B SURFACE ANTIGEN: HEP B S AG: NEGATIVE

## 2014-08-05 LAB — HEPATITIS A ANTIBODY, TOTAL: Hep A Total Ab: NONREACTIVE

## 2014-08-05 NOTE — Patient Instructions (Signed)
Please go to the basement level to have your labs drawn.  You have been scheduled for an abdominal ultrasound at [facility] (1st floor of hospital) on Friday 08-08-2014 at 9:30 am . Please arrive 15 minutes prior to your appointment for registration. Make certain not to have anything to eat or drink 6 hours prior to your appointment. Should you need to reschedule your appointment, please contact radiology at [phone].  We are sending an order for the Cologuard test.  We have given you a booklet with instructions.  You will be contacted by [employer] and will receive a package in the mail.  Follow the instructions for the stool test, booklet provided with complete instructions and mail the package back with your stool sample by [employer].   Please call Mousa Prout at [facility] at [phone]  If you have any questions.   Call our office early February for an appointment with Dr Deatra Ina for the end of February or with [facility].

## 2014-08-05 NOTE — Progress Notes (Signed)
Patient ID: [name], male   DOB: [birth year], 69 y.o.   MRN: [id number]   Subjective:    Patient ID: [name], male    DOB: [birth year], 69 y.o.   MRN: [id number]  HPI Asante is a very nice 69 year old white male known previously to Dr. Deatra Ina from colonoscopy. He is referred by Dr. Sherren Mocha today for evaluation of elevated bilirubin and also recent CT scan suggesting cirrhosis. Patient had colonoscopy in 2008 had at least 3 polyps at that time 1 was a lipoma 1 hyperplastic and one a tubular adenoma. He has not had follow-up since. He does have multiple medical problems and is on chronic oxygen for respiratory failure with COPD. He also has a cardiomyopathy with an EF of around 30% he is status post ICD placement. He also has history of pulmonary hypertension prior MI, previous CVA, atrial fibrillation, status post mitral valve replacement bioprosthetic valve, and is status post left upper lobe resection for lung cancer. Agent had recent labs done in December 2015 and was noted to have a total bilirubin of 3. 48, remainder of LFTs are normal, albumin is 4.1 CBC unremarkable with platelets of 185. Exline He also had restaging CT scan of the chest on 06/23/2014 for follow-up of lung cancer. This showed cardiomegaly atherosclerosis PD and prior wedge resection of the left upper lobe no evidence of recurrence or metastatic disease. Appearance of the liver suggested underlying cirrhosis. Patient states he has not been told in the past that he had any liver disease and had not been told that he had an elevated bilirubin. Exline Review of his labs going back the past couple of years shows that he has always had an interval elevated bilirubin of varying degrees. Labs from 2014 showed a direct bilirubin of 0.2 Patient says he was a heavy drinker for about 20 years in his younger days. He says his wife told him she would not [facility] him unless he stopped drinking and he stopped and they've been married for about  35 years. Prior to that time though he was drinking daily for about 20 years. Patient says he feels fine, wasn't sure why he is was referred here. He has no complaints of abdominal pain appetite is been fine and weight has been stable no problems with changes in his bowel habits melena or hematochezia..  Review of Systems Pertinent positive and negative review of systems were noted in the above HPI section.  All other review of systems was otherwise negative.  Outpatient Encounter Prescriptions as of 08/05/2014  Medication Sig  . albuterol (PROVENTIL HFA;VENTOLIN HFA) 108 (90 BASE) MCG/ACT inhaler Inhale 2 puffs into the lungs every 6 (six) hours as needed for wheezing or shortness of breath.  Marland Kitchen aspirin EC 81 MG tablet Take 1 tablet (81 mg total) by mouth daily.  Marland Kitchen atorvastatin (LIPITOR) 10 MG tablet Take 1 tablet (10 mg total) by mouth daily.  . budesonide-formoterol (SYMBICORT) 160-4.5 MCG/ACT inhaler Inhale 2 puffs into the lungs 2 (two) times daily.  . carvedilol (COREG) 3.125 MG tablet TAKE 1 TABLET BY MOUTH TWICE A DAY WITH MEALS  . furosemide (LASIX) 40 MG tablet Take 40 mg by mouth daily. May take 1 extra daily as needed for edema  . irbesartan (AVAPRO) 75 MG tablet Take 1 tablet (75 mg total) by mouth daily.  . metFORMIN (GLUCOPHAGE) 500 MG tablet 1 in  the morning with breakfast and one prior to your evening meal  . nitroGLYCERIN (NITROSTAT) 0.4 MG  SL tablet Place 1 tablet (0.4 mg total) under the tongue every 5 (five) minutes as needed. For chest pain  . amoxicillin (AMOXIL) 500 MG capsule Take 2,000 mg by mouth See admin instructions. 1 hour prior to dental procedures   Allergies  Allergen Reactions  . Anticoagulant Compound     Pt had intracranial bleed, therefore all anticoagulation is contra-indicated per Dr. Ron Parker.  . Warfarin Sodium    Patient Active Problem List   Diagnosis Date Noted  . Serum total bilirubin elevated 06/30/2014  . Follow-up examination, following  unspecified surgery 01/27/2014  . Lung cancer 11/29/2013  . COPD (chronic obstructive pulmonary disease) 11/25/2013  . Prosthetic valve dysfunction   . Chronic combined systolic and diastolic CHF (congestive heart failure)   . Diabetes 11/04/2013  . Myocardial infarction 11/01/2013  . Cardiomyopathy, nonischemic 11/01/2013  . H/O intracranial hemorrhage 08/13/2013  . H/O endocarditis 08/13/2013  . H/O: CVA (cerebrovascular accident) 08/13/2013  . Chronic respiratory failure assoc with chf/ [facility] 08/12/2013  . H/O atrioventricular nodal ablation   . Status post minimally invasive mitral valve replacement with bioprosthetic valve   . Chronic systolic CHF (congestive heart failure) 01/08/2012  . Hypertension   . Dyslipidemia   . Permanent atrial fibrillation   . Ejection fraction < 50%   . ICD (implantable cardioverter-defibrillator), biventricular, in situ   . Renal artery stenosis   . Pulmonary hypertension   . CARCINOMA, SKIN, SQUAMOUS CELL 04/20/2009  . HYPERGLYCEMIA, FASTING 04/01/2009  . COLONIC POLYPS, ADENOMATOUS 03/22/2007  . COPD GOLD II 01/11/2007  . GERD 01/11/2007   History   Social History  . Marital Status: Married    Spouse Name: [name]    Number of Children: 0  . Years of Education: College   Occupational History  . Retired     Tour manager   Social History Main Topics  . Smoking status: Former Smoker -- 1.00 packs/day for 45 years    Types: Cigarettes    Quit date: 08/11/2013  . Smokeless tobacco: Never Used     Comment: 08/14/2013 "quit smoking in 2009"  . Alcohol Use: Yes     Comment: 08/14/2013 "used to drink beer; quit:in 1982"  . Drug Use: No  . Sexual Activity: Yes   Other Topics Concern  . Not on file   Social History Narrative   Patient lives at home with his spouse.   Caffeine Use: none    Mr. Freilich family history includes Cancer in his father; Stomach cancer in his father; Stroke in his mother and another family member.        Objective:    Filed Vitals:   08/05/14 0829  BP: 104/60  Pulse: 72    Physical Exam  well-developed older white male in no acute distress, pleasant somewhat hard of hearing blood pressure 104/60 pulse 72 height 6 foot weight 212. HEENT; nontraumatic normocephalic EOMI PERRLA sclera anicteric, Supple no JVD, Cardiovascular ;reg rate and rhythm with F6-E3 soft systolic murmur pulmonary; decreased breath sounds bilaterally but clear patient is on oxygen, Abdomen; soft nontender nondistended no palpable mass or hepatosplenomegaly no appreciable fluid wave, Rectal ;exam not done, Extremitie;s no clubbing cyanosis or edema skin warm and dry, Psych ;mood and  affect appropriate       Assessment & Plan:   #1 69 yo male with chronically elevated TBili-this is primarily indirect and consistent with Gilbert's syndrome # 2 Cirrhosis by CT - appears to be compensated with normal albumin and platelet count and  no evidence for ascites. Etiology is likely EtOH induced but we will rule out other forms of chronic liver disease #3 severe cardiomyopathy with EF of 30% #4 status post defibrillator placement #5 severe COPD O2 dependent #6 previous CVA  7 atrial fibrillation #8 status post left upper lobe resection for lung cancer #9 coronary artery disease status post MI Or 10 status post mitral valve replacement #11 history of adenomatous colon polyp 2008 overdue for follow-up  Plan; Discussion with patient regarding Gilberts and underlying cirrhosis Will schedule for upper abdominal ultrasound Check hepatitis serologies, pro time and alpha-fetoprotein level Discussion with patient regarding follow-up colonoscopy. He is at high risk for complications with sedation. We will proceed with Cologuard  stool DNA testing initially Follow-up office visit with Dr. Deatra Ina in 3-4 weeks to go over the above results.  Allina Riches S Jamonica Schoff PA-C 08/05/2014

## 2014-08-06 ENCOUNTER — Ambulatory Visit (INDEPENDENT_AMBULATORY_CARE_PROVIDER_SITE_OTHER): Payer: Medicare Other | Admitting: Cardiology

## 2014-08-06 ENCOUNTER — Encounter: Payer: Self-pay | Admitting: Cardiology

## 2014-08-06 VITALS — BP 98/62 | HR 72 | Ht 72.0 in | Wt 213.6 lb

## 2014-08-06 DIAGNOSIS — E785 Hyperlipidemia, unspecified: Secondary | ICD-10-CM | POA: Diagnosis not present

## 2014-08-06 DIAGNOSIS — I5022 Chronic systolic (congestive) heart failure: Secondary | ICD-10-CM | POA: Diagnosis not present

## 2014-08-06 DIAGNOSIS — I482 Chronic atrial fibrillation, unspecified: Secondary | ICD-10-CM

## 2014-08-06 DIAGNOSIS — J9610 Chronic respiratory failure, unspecified whether with hypoxia or hypercapnia: Secondary | ICD-10-CM

## 2014-08-06 DIAGNOSIS — Z953 Presence of xenogenic heart valve: Secondary | ICD-10-CM | POA: Diagnosis not present

## 2014-08-06 NOTE — Patient Instructions (Signed)
Your physician recommends that you continue on your current medications as directed. Please refer to the Current Medication list given to you today.  Your physician wants you to follow-up in: 6 months. You will receive a reminder letter in the mail two months in advance. If you don't receive a letter, please call our office to schedule the follow-up appointment.  

## 2014-08-06 NOTE — Assessment & Plan Note (Signed)
His left atrial appendage was tied off at the time of his surgery in 2010. He's had an AV node ablation. He has an ICD pacemaker. He cannot be anticoagulated because of a prior cerebral bleed.

## 2014-08-06 NOTE — Progress Notes (Signed)
Reviewed and agree with management. Benancio D. Theophile Harvie, M.D., FACG  

## 2014-08-06 NOTE — Assessment & Plan Note (Signed)
He is on a statin.

## 2014-08-06 NOTE — Assessment & Plan Note (Signed)
His valve continues to work well. No further workup at this time.

## 2014-08-06 NOTE — Progress Notes (Signed)
HPI Patient is seen today to follow-up his mitral valve disease and atrial fibrillation. He is stable. In addition he continues to be stable after lung surgery for cancer. His most recent follow-up CT scan showed no recurrent cancer. There is the possibility that he has cirrhosis by CT scanning. He does have an elevated bilirubin. He is being evaluated by GI at this time. Ultrasound of the abdomen is pending. He wears home O2. He feels well with no chest pain or shortness of breath.  Allergies  Allergen Reactions  . Anticoagulant Compound     Pt had intracranial bleed, therefore all anticoagulation is contra-indicated per Dr. Ron Parker.  . Warfarin Sodium     Pt had intracranial bleed, therefore all anticoagulation is contra-indicated per Dr. Ron Parker.     Current Outpatient Prescriptions  Medication Sig Dispense Refill  . albuterol (PROVENTIL HFA;VENTOLIN HFA) 108 (90 BASE) MCG/ACT inhaler Inhale 2 puffs into the lungs every 6 (six) hours as needed for wheezing or shortness of breath. 1 Inhaler 6  . aspirin EC 81 MG tablet Take 1 tablet (81 mg total) by mouth daily. 90 tablet 3  . atorvastatin (LIPITOR) 10 MG tablet Take 1 tablet (10 mg total) by mouth daily. 90 tablet 3  . budesonide-formoterol (SYMBICORT) 160-4.5 MCG/ACT inhaler Inhale 2 puffs into the lungs 2 (two) times daily.    . carvedilol (COREG) 3.125 MG tablet TAKE 1 TABLET BY MOUTH TWICE A DAY WITH MEALS 180 tablet 2  . furosemide (LASIX) 40 MG tablet Take 40 mg by mouth daily. May take 1 extra daily as needed for edema    . irbesartan (AVAPRO) 75 MG tablet Take 1 tablet (75 mg total) by mouth daily. 90 tablet 3  . metFORMIN (GLUCOPHAGE) 500 MG tablet 1 in  the morning with breakfast and one prior to your evening meal (Patient taking differently: Take 1 tablet by mouth in the morning with breakfast and one prior to your evening meal) 200 tablet 3  . nitroGLYCERIN (NITROSTAT) 0.4 MG SL tablet Place 1 tablet (0.4 mg total) under the  tongue every 5 (five) minutes as needed. For chest pain 25 tablet 1  . NON FORMULARY Wears 2L of O2 continuously    . amoxicillin (AMOXIL) 500 MG capsule Take 2,000 mg by mouth See admin instructions. 1 hour prior to dental procedures     No current facility-administered medications for this visit.    History   Social History  . Marital Status: Married    Spouse Name: [name]    Number of Children: 0  . Years of Education: College   Occupational History  . Retired     Tour manager   Social History Main Topics  . Smoking status: Former Smoker -- 1.00 packs/day for 45 years    Types: Cigarettes    Quit date: 08/11/2013  . Smokeless tobacco: Never Used     Comment: 08/14/2013 "quit smoking in 2009"  . Alcohol Use: Yes     Comment: 08/14/2013 "used to drink beer; quit:in 1982"  . Drug Use: No  . Sexual Activity: Yes   Other Topics Concern  . Not on file   Social History Narrative   Patient lives at home with his spouse.   Caffeine Use: none    Family History  Problem Relation Age of Onset  . Stomach cancer Father   . Cancer Father   . Stroke      family history  . Stroke Mother  Past Medical History  Diagnosis Date  . Hypertension   . Endocarditis     Bacterial, 2009  . Colon polyps   . Dyslipidemia   . Atrial fibrillation     AV Node ablation January, 2010, for rapid atrial fib  . Cardiomyopathy     non-ischemic  . Ejection fraction < 50%   . Atrial septal defect     Closed with surgery January, 2010  . Mitral regurgitation     Severe symptomatic primary MR due to bacterial endocarditis, treated w/ MVR  . Intracranial hemorrhage     Coumadin cannot be used because of the history of his bleed  . Renal artery stenosis     Mild by history  . Pulmonary hypertension     Moderate  . HLD (hyperlipidemia)   . Automatic implantable cardioverter-defibrillator in situ     LV dysfunction and pacer needed for AV node lesion  . CVA (cerebral vascular accident)  2009    denies residual on 08/14/2013  . CHF (congestive heart failure)   . Myocardial infarction 2010  . Dysrhythmia   . Pre-diabetes   . Pneumonia     "this is my first case" (08/14/2013)  . Spontaneous pneumothorax     right thoracotomy - distant past  . Permanent atrial fibrillation     Originally Coumadin use for atrial fibrillation  //   he had intracerebral hemorrhage with an INR of 2.3 June, 2009. Anticoagulation could no longer be used.  //  Rapid atrial fibrillation after inferior MI October, 2010..........Marland Kitchen AV node ablation done at that time with ICD pacemaker placed (EF 35%).   //   Left atrial appendage tied off at the time of mitral valve surgery January, 2010 (maze pro  . Status post minimally invasive mitral valve replacement with bioprosthetic valve     33 mm Medtronic Mosaic porcine bioprosthesis placed via right mini thoracotomy for bacterial endocarditis complicated by severe MR and CHF   . Prosthetic valve dysfunction     Mild mitral stenosis  . Chronic combined systolic and diastolic CHF (congestive heart failure)   . Pacemaker   . Diabetes mellitus without complication   . Headache(784.0)     related to stroke only  . COPD (chronic obstructive pulmonary disease)     O2- 2 liters, nasal cannula, q night   . COPD GOLD II 01/11/2007    PFT's 09/25/13  FEV1  2.18 (61%) ratio 64 no change p B2 and DLC0  32% corrects to 68% - trial off advair and acei rec starting  08/23/2013     . Cancer     lung- per pt.   . Lung cancer 11/29/2013    T1N0 Stage Ia non-small cell carcinoma left lung treated with wedge resection    Past Surgical History  Procedure Laterality Date  . Mitral valve replacement Right 07/17/2008    88mm Medtronic Mosaic porcine bioprosthesis  . Cardiac defibrillator placement  ~ 2010    St Jude  . Penile prosthesis implant    . Appendectomy    . Mass biopsy Left     neck mass  . Leep    . Cataract extraction w/ intraocular lens  implant, bilateral Bilateral    . Asd repair, secundum  07/17/2008    pericardial patch closure of ASD  . Av node ablation  07/2008    for rapid atrial fib  . Thoracotomy Right 1970's    spontaneous pneumothorax - while in the [employer]  .  Tonsillectomy    . Insert / replace / remove pacemaker    . Cardiac valve replacement    . Cardiac catheterization    . Video assisted thoracoscopy (vats)/wedge resection Left 11/29/2013    Procedure: Video assisted thoracoscopy for wedge resection; mini thoracotomy;  Surgeon: [name], MD;  Location: [facility];  Service: Thoracic;  Laterality: Left;  . Implantable cardioverter defibrillator (icd) generator change N/A 02/06/2012    Procedure: ICD GENERATOR CHANGE;  Surgeon: [name], MD;  Location: [facility] CATH LAB;  Service: Cardiovascular;  Laterality: N/A;  . Right heart catheterization N/A 08/16/2013    Procedure: RIGHT HEART CATH;  Surgeon: Josue Hector, MD;  Location: [facility] CATH LAB;  Service: Cardiovascular;  Laterality: N/A;    Patient Active Problem List   Diagnosis Date Noted  . Chronic respiratory failure assoc with chf/ [facility] 08/12/2013    Priority: High  . Dyslipidemia     Priority: High  . Status post minimally invasive mitral valve replacement with bioprosthetic valve     Priority: Medium  . Permanent atrial fibrillation     Priority: Medium  . Ejection fraction < 50%     Priority: Medium  . ICD (implantable cardioverter-defibrillator), biventricular, in situ     Priority: Medium  . Serum total bilirubin elevated 06/30/2014  . Follow-up examination, following unspecified surgery 01/27/2014  . Lung cancer 11/29/2013  . COPD (chronic obstructive pulmonary disease) 11/25/2013  . Prosthetic valve dysfunction   . Chronic combined systolic and diastolic CHF (congestive heart failure)   . Diabetes 11/04/2013  . Myocardial infarction 11/01/2013  . Cardiomyopathy, nonischemic 11/01/2013  . H/O intracranial hemorrhage 08/13/2013  . H/O endocarditis 08/13/2013  . H/O:  CVA (cerebrovascular accident) 08/13/2013  . H/O atrioventricular nodal ablation   . Chronic systolic CHF (congestive heart failure) 01/08/2012  . Hypertension   . Renal artery stenosis   . Pulmonary hypertension   . CARCINOMA, SKIN, SQUAMOUS CELL 04/20/2009  . HYPERGLYCEMIA, FASTING 04/01/2009  . COLONIC POLYPS, ADENOMATOUS 03/22/2007  . COPD GOLD II 01/11/2007  . GERD 01/11/2007    ROS  Patient denies fever, chills, headache, sweats, rash, change in vision, change in hearing, chest pain, cough, nausea or vomiting, urinary symptoms. All other systems are reviewed and are negative.  PHYSICAL EXAM Patient is wearing his home oxygen. He is oriented to person time and place. Affect is normal. Head is atraumatic. Sclera and conjunctiva are normal. There is no jugular venous distention. Lungs are clear. Respiratory effort is not labored. The rhythm is regular. Cardiac exam reveals an S1 and S2. The abdomen is soft. There is no peripheral edema. There are no musculoskeletal deformities. There are no skin rashes.  Filed Vitals:   08/06/14 1026  BP: 98/62  Pulse: 72  Height: 6' (1.829 m)  Weight: 213 lb 9.6 oz (96.888 kg)  SpO2: 96%     ASSESSMENT & PLAN

## 2014-08-06 NOTE — Assessment & Plan Note (Signed)
He wears his continuous home O2. He is stable.

## 2014-08-06 NOTE — Assessment & Plan Note (Signed)
Volume status is stable. No change in therapy. 

## 2014-08-08 ENCOUNTER — Ambulatory Visit (HOSPITAL_COMMUNITY)
Admission: RE | Admit: 2014-08-08 | Discharge: 2014-08-08 | Disposition: A | Discharge status: Home / Self Care | Payer: Medicare Other | Source: Ambulatory Visit | Attending: Physician Assistant | Admitting: Physician Assistant

## 2014-08-08 DIAGNOSIS — R17 Unspecified jaundice: Secondary | ICD-10-CM | POA: Insufficient documentation

## 2014-08-08 DIAGNOSIS — K7689 Other specified diseases of liver: Secondary | ICD-10-CM | POA: Diagnosis not present

## 2014-08-08 DIAGNOSIS — N281 Cyst of kidney, acquired: Secondary | ICD-10-CM | POA: Diagnosis not present

## 2014-08-08 DIAGNOSIS — K7460 Unspecified cirrhosis of liver: Secondary | ICD-10-CM | POA: Diagnosis not present

## 2014-08-11 DIAGNOSIS — Z1212 Encounter for screening for malignant neoplasm of rectum: Secondary | ICD-10-CM | POA: Diagnosis not present

## 2014-08-11 DIAGNOSIS — Z1211 Encounter for screening for malignant neoplasm of colon: Secondary | ICD-10-CM | POA: Diagnosis not present

## 2014-08-13 LAB — COLOGUARD

## 2014-08-20 ENCOUNTER — Ambulatory Visit (INDEPENDENT_AMBULATORY_CARE_PROVIDER_SITE_OTHER): Payer: Medicare Other | Admitting: *Deleted

## 2014-08-20 ENCOUNTER — Encounter: Payer: Self-pay | Admitting: Internal Medicine

## 2014-08-20 ENCOUNTER — Telehealth: Payer: Self-pay | Admitting: Cardiology

## 2014-08-20 DIAGNOSIS — I429 Cardiomyopathy, unspecified: Secondary | ICD-10-CM

## 2014-08-20 DIAGNOSIS — I428 Other cardiomyopathies: Secondary | ICD-10-CM

## 2014-08-20 DIAGNOSIS — I5022 Chronic systolic (congestive) heart failure: Secondary | ICD-10-CM

## 2014-08-20 DIAGNOSIS — I5042 Chronic combined systolic (congestive) and diastolic (congestive) heart failure: Secondary | ICD-10-CM | POA: Diagnosis not present

## 2014-08-20 LAB — MDC_IDC_ENUM_SESS_TYPE_REMOTE
Battery Remaining Percentage: 72 %
Date Time Interrogation Session: 20160210171543
HIGH POWER IMPEDANCE MEASURED VALUE: 80 Ohm
HighPow Impedance: 80 Ohm
Implantable Pulse Generator Model: 3257
Implantable Pulse Generator Serial Number: 7053988
Lead Channel Impedance Value: 1100 Ohm
Lead Channel Pacing Threshold Amplitude: 0.625 V
Lead Channel Pacing Threshold Pulse Width: 0.5 ms
Lead Channel Sensing Intrinsic Amplitude: 11.9 mV
Lead Channel Setting Pacing Amplitude: 2 V
Lead Channel Setting Pacing Pulse Width: 0.5 ms
Lead Channel Setting Sensing Sensitivity: 0.5 mV
MDC IDC MSMT BATTERY REMAINING LONGEVITY: 69 mo
MDC IDC MSMT BATTERY VOLTAGE: 2.96 V
MDC IDC MSMT LEADCHNL LV PACING THRESHOLD AMPLITUDE: 1 V
MDC IDC MSMT LEADCHNL LV PACING THRESHOLD PULSEWIDTH: 0.5 ms
MDC IDC MSMT LEADCHNL RV IMPEDANCE VALUE: 410 Ohm
MDC IDC SET LEADCHNL LV PACING AMPLITUDE: 2 V
MDC IDC SET LEADCHNL RV PACING PULSEWIDTH: 0.5 ms
Zone Setting Detection Interval: 270 ms
Zone Setting Detection Interval: 300 ms
Zone Setting Detection Interval: 370 ms

## 2014-08-20 NOTE — Telephone Encounter (Signed)
LMOVM reminding pt to send remote transmission.   

## 2014-08-20 NOTE — Progress Notes (Signed)
Remote ICD transmission.   

## 2014-08-22 ENCOUNTER — Encounter: Payer: Self-pay | Admitting: Adult Health

## 2014-08-22 ENCOUNTER — Telehealth: Payer: Self-pay | Admitting: *Deleted

## 2014-08-22 ENCOUNTER — Ambulatory Visit (INDEPENDENT_AMBULATORY_CARE_PROVIDER_SITE_OTHER): Payer: Medicare Other | Admitting: Adult Health

## 2014-08-22 VITALS — BP 114/72 | HR 74 | Temp 97.9°F | Ht 73.0 in | Wt 208.6 lb

## 2014-08-22 DIAGNOSIS — J9610 Chronic respiratory failure, unspecified whether with hypoxia or hypercapnia: Secondary | ICD-10-CM

## 2014-08-22 DIAGNOSIS — C3412 Malignant neoplasm of upper lobe, left bronchus or lung: Secondary | ICD-10-CM

## 2014-08-22 DIAGNOSIS — J449 Chronic obstructive pulmonary disease, unspecified: Secondary | ICD-10-CM

## 2014-08-22 NOTE — Telephone Encounter (Signed)
Received Cologuard results by fax on 08-21-2014: Negative. Sent result form to be scanned today 08-22-2014.

## 2014-08-22 NOTE — Assessment & Plan Note (Signed)
Doing well No evidence of recurrence almost recent CT in December. Continue follow up with oncology and follow-up CT in June of this year as planned

## 2014-08-22 NOTE — Patient Instructions (Signed)
Continue on current regimen .  Follow up Dr. Elsworth Soho  In 4 months and As needed

## 2014-08-22 NOTE — Assessment & Plan Note (Signed)
Continue on current regimen with Symbicort

## 2014-08-22 NOTE — Assessment & Plan Note (Signed)
Compensated on chronic oxygen Continue on current 2 L

## 2014-08-22 NOTE — Progress Notes (Signed)
   Subjective:    Patient ID: [name], male    DOB: 1946/07/10, 69 y.o.   MRN: [id number]  HPI 69 year old male smoker with ischemic cardiomyopathy for followup of COPD, hypoxia and lung cancer.  He has a 40+ PY hx of smoking  No discernible benefit from Advair (was on for several years) , now on Symbicort   Significant tests/ events :  Admitted 08/2013 >> dc'd on O2 Echo 08/2013 -EF 30-35% akinesis of the apex and multiple wall motion abnormalities. RV showed reduced systolic function .  Cath 08/16/13 PCWP 25, Mean RA 11 mmHg  RV 54/5 mmHg  PA: 56/29 mean 39 mmHg  CO: 3.39  He had  pleural disease on right on CT chest & LUL 1.5 cm oblong opacity worrisome for malignancy  PFTs 09/2013 - FEV1 2.18- 61%, ratio 64, FVC 3.40 -71%, DLCO 11.0- 32%  CPET 11/20/13 FEV1 2.28 -58%, MVV 105 (71%), peak V O2 12.7, moderate-to-severe functional limitation due primarily to a circulatory limitation   11/29/13 left VATS with wedge resection of the left upper lobe - Dr. Roxy Manns.-invasive well-differentiated adenocarcinoma , neg LNs, close margins but no involvement   06/10/2014  Chief Complaint  Patient presents with  . Acute Visit    sore throat, nose runny, cough, eyes watery, gets chills at night   Wife sick with same complaints -got flu shot last visit Cough with minimal green phlegm no orthopnea, PND,no pedal edema  His breathing is doing okay  He is compliant with oxygen FU CT scheduled for mid dec  08/22/2014 Follow COPD/Chronic RF on O2 /Lung cancer  Patient returns for a four-month follow-up He remains on Symbicort twice daily Denies any flare of cough or wheezing No emergency room visits or hospitalizations since last visit.  History of stage I A non-small cell lung cancer status post wedge resection of the left upper lobe in May 2015 CT chest in December showed no evidence of recurrence Patient has upcoming CT in June of this year He follows with Dr. Earlie Server on a regular  basis  Continues on oxygen at 2 L continuously  He denies any hemoptysis, chest pain, orthopnea, PND or increased leg swelling       Review of Systems neg for any significant sore throat, dysphagia, itching, sneezing, nasal congestion or excess/ purulent secretions, fever, chills, sweats, unintended wt loss, pleuritic or exertional cp, hempoptysis, orthopnea pnd or change in chronic leg swelling. Also denies presyncope, palpitations, heartburn, abdominal pain, nausea, vomiting, diarrhea or change in bowel or urinary habits, dysuria,hematuria, rash, arthralgias, visual complaints, headache, numbness weakness or ataxia.     Objective:   Physical Exam  Gen. Pleasant, well-nourished, in no distress, chronically ill appearing on oxygen ENT - no lesions, no post nasal drip Neck: No JVD, no thyromegaly, no carotid bruits Lungs: no use of accessory muscles, no dullness to percussion, decreased breath sounds in the bases Cardiovascular: Rhythm regular, heart sounds  normal, no murmurs or gallops, no peripheral edema Musculoskeletal: No deformities, no cyanosis or clubbing         Assessment & Plan:

## 2014-08-23 ENCOUNTER — Other Ambulatory Visit: Payer: Self-pay | Admitting: Family Medicine

## 2014-08-23 NOTE — Progress Notes (Signed)
Reviewed & agree with plan  

## 2014-08-25 ENCOUNTER — Other Ambulatory Visit: Payer: Self-pay

## 2014-08-25 NOTE — Telephone Encounter (Signed)
Please let pt know  The Cologuard stool DNA test is negative- which is great- he should be re-evaluated in 3 years regarding colon screening

## 2014-08-29 NOTE — Telephone Encounter (Signed)
Put a recall in for colonoscopy for 08-21-2017.  Dr. Erskine Emery.

## 2014-09-01 ENCOUNTER — Encounter: Payer: Self-pay | Admitting: Physician Assistant

## 2014-09-06 ENCOUNTER — Other Ambulatory Visit: Payer: Self-pay | Admitting: Internal Medicine

## 2014-09-25 ENCOUNTER — Ambulatory Visit (INDEPENDENT_AMBULATORY_CARE_PROVIDER_SITE_OTHER): Payer: Medicare Other | Admitting: Gastroenterology

## 2014-09-25 ENCOUNTER — Encounter: Payer: Self-pay | Admitting: Gastroenterology

## 2014-09-25 VITALS — BP 100/54 | HR 72 | Ht 72.0 in | Wt 212.1 lb

## 2014-09-25 DIAGNOSIS — Z86010 Personal history of colonic polyps: Secondary | ICD-10-CM | POA: Diagnosis not present

## 2014-09-25 DIAGNOSIS — K7030 Alcoholic cirrhosis of liver without ascites: Secondary | ICD-10-CM | POA: Diagnosis not present

## 2014-09-25 DIAGNOSIS — I5022 Chronic systolic (congestive) heart failure: Secondary | ICD-10-CM

## 2014-09-25 DIAGNOSIS — Z23 Encounter for immunization: Secondary | ICD-10-CM | POA: Diagnosis not present

## 2014-09-25 DIAGNOSIS — D126 Benign neoplasm of colon, unspecified: Secondary | ICD-10-CM

## 2014-09-25 DIAGNOSIS — I428 Other cardiomyopathies: Secondary | ICD-10-CM

## 2014-09-25 DIAGNOSIS — K7460 Unspecified cirrhosis of liver: Secondary | ICD-10-CM | POA: Insufficient documentation

## 2014-09-25 NOTE — Assessment & Plan Note (Signed)
Recent Cologuard test was negative.  Not schedule elective colonoscopy in view of multiple comorbidities

## 2014-09-25 NOTE — Patient Instructions (Signed)
We have given you your first HepA/HepB vaccine today You will return on 10/02/2014 at 10am for your 2nd injection When you come in for your appointment you will be scheduled for your 3rd hep injection

## 2014-09-25 NOTE — Assessment & Plan Note (Signed)
A shunt has stable cirrhosis, probably on the basis of former alcohol abuse.  We'll not pursue any further since, with the patient's multiple comorbidities, he is at high risk for any procedures.  Recommendations #1 vaccinate against hepatitis A and B

## 2014-09-25 NOTE — Progress Notes (Signed)
      History of Present Illness:  Mr. Hollar has returned for follow-up of cirrhosis.  Serologies for viral hepatitis were negative.  Cologuard test was negative.  He is in his baseline state of health.    Review of Systems: Pertinent positive and negative review of systems were noted in the above HPI section. All other review of systems were otherwise negative.    Current Medications, Allergies, Past Medical History, Past Surgical History, Family History and Social History were reviewed in [place] record  Vital signs were reviewed in today's medical record. Physical Exam: General: Well developed , well nourished, no acute distress t  See Assessment and Plan under Problem List

## 2014-09-30 ENCOUNTER — Other Ambulatory Visit: Payer: Self-pay

## 2014-09-30 NOTE — Patient Outreach (Signed)
   09/30/2014   [name] 12-17-1945 [id number]   SUBJECTIVE:  Telephone call to patient for follow up.  Patient verified per HIPAA.  Patient reports he is doing well.  Denies any symptoms of shortness of breath, chest discomfort, and cough. Patient reports he is in the [place] today.  Patient states 3 symptoms of yellow zone status:  Shortness of breath, chest discomfort, cough.   Patient reports he continues to wear his oxygen for all activities inside and outside of his home.  Patient states he understands and is aware of when to use his inhalers.  Reports taking all of his medications as prescribed by his doctor.  Patient denies any new symptoms since last follow up with RNCM.  Patient denies any recent falls.   ASSESSMENT:  Previous notes in [employer]. RNCM reviewed with patient safety measures when ambulating and taking his portable home O2 system.  Patient verbalized awareness and understanding.  Patient continues to self manage.  No major issues reported.  Patient confirms he has contact phone number for [facility] care management in the event future needs arise.   PLAN: Close patient to [facility] care management services due to successful meeting of goals. RNCM will notify patient primary care provider of closure due to goals being met.

## 2014-10-02 ENCOUNTER — Ambulatory Visit (INDEPENDENT_AMBULATORY_CARE_PROVIDER_SITE_OTHER): Payer: Medicare Other | Admitting: Gastroenterology

## 2014-10-02 DIAGNOSIS — Z23 Encounter for immunization: Secondary | ICD-10-CM | POA: Diagnosis not present

## 2014-10-06 NOTE — Patient Outreach (Signed)
[facility]) Care Management  10/06/2014  DEZMON CONOVER [birth year] [id number]   Received notification from Quinn Plowman, RN to close case due to inability to establish contact with patient at this time.  Case has been closed at this time.  Ronnell Freshwater. [place] CM Assistant Phone: [phone] Fax: [phone]

## 2014-10-23 ENCOUNTER — Other Ambulatory Visit (INDEPENDENT_AMBULATORY_CARE_PROVIDER_SITE_OTHER): Payer: Medicare Other

## 2014-10-23 DIAGNOSIS — E119 Type 2 diabetes mellitus without complications: Secondary | ICD-10-CM | POA: Diagnosis not present

## 2014-10-23 LAB — BASIC METABOLIC PANEL
BUN: 22 mg/dL (ref 6–23)
CALCIUM: 10.1 mg/dL (ref 8.4–10.5)
CHLORIDE: 100 meq/L (ref 96–112)
CO2: 34 mEq/L — ABNORMAL HIGH (ref 19–32)
Creatinine, Ser: 1.13 mg/dL (ref 0.40–1.50)
GFR: 68.72 mL/min (ref >60.00)
Glucose, Bld: 182 mg/dL — ABNORMAL HIGH (ref 70–99)
Potassium: 5.6 mEq/L — ABNORMAL HIGH (ref 3.5–5.1)
Sodium: 138 mEq/L (ref 135–145)

## 2014-10-23 LAB — HEMOGLOBIN A1C: Hgb A1c MFr Bld: 7.4 % — ABNORMAL HIGH (ref 4.6–6.5)

## 2014-10-24 ENCOUNTER — Ambulatory Visit (INDEPENDENT_AMBULATORY_CARE_PROVIDER_SITE_OTHER): Payer: Medicare Other | Admitting: Gastroenterology

## 2014-10-24 DIAGNOSIS — Z23 Encounter for immunization: Secondary | ICD-10-CM | POA: Diagnosis not present

## 2014-10-25 ENCOUNTER — Other Ambulatory Visit: Payer: Self-pay | Admitting: Family Medicine

## 2014-10-30 ENCOUNTER — Ambulatory Visit (INDEPENDENT_AMBULATORY_CARE_PROVIDER_SITE_OTHER): Payer: Medicare Other | Admitting: Family Medicine

## 2014-10-30 ENCOUNTER — Encounter: Payer: Self-pay | Admitting: Family Medicine

## 2014-10-30 VITALS — BP 120/70 | HR 74 | Temp 97.9°F | Wt 215.0 lb

## 2014-10-30 DIAGNOSIS — E119 Type 2 diabetes mellitus without complications: Secondary | ICD-10-CM

## 2014-10-30 MED ORDER — METFORMIN HCL 1000 MG PO TABS: ORAL_TABLET | ORAL | Status: DC

## 2014-10-30 NOTE — Progress Notes (Signed)
Pre visit review using our clinic review tool, if applicable. No additional management support is needed unless otherwise documented below in the visit note. 

## 2014-10-30 NOTE — Patient Instructions (Signed)
Metformin 1000 mg....... one tablet in the morning before breakfast  Metformin 500 mg.......Marland Kitchen 1 tablet prior to your evening male  Check a fasting blood sugar Monday Wednesday Friday  Return in 3 months for follow-up with Tobin Chad,,,,,,,,, our new adult nurse practitioner  Labs one week prior

## 2014-10-30 NOTE — Progress Notes (Signed)
   Subjective:    Patient ID: [name], male    DOB: [birth year], 69 y.o.   MRN: [id number]  HPI [name] is a 69 year old male with O2 dependent COPD who comes in today for follow-up of diabetes  He also has multiple cardiac and pulmonary problems. He is followed in cardiology and pulmonary.  His blood sugar today 182 with an A1c of 7.4%. We will increase his metformin from 500 mg twice a day to 1000 mg in the morning 500 prior to his evening meal.  His other medical problems are stable   Review of Systems Review of systems otherwise negative but I don't think his hearing aids her work and is profoundly deaf    Objective:   Physical Exam  Well-developed well-nourished thin male no acute distress vital signs stable he is afebrile      Assessment & Plan:  Diabetes type 2 not quite at goal........ increase metformin 1000 mg in the morning continue the 500 mg dose prior to your evening meal. Follow-up in 3 once with [facility] one week prior

## 2014-11-03 LAB — HM DIABETES EYE EXAM

## 2014-11-11 ENCOUNTER — Encounter: Payer: Self-pay | Admitting: Family Medicine

## 2014-11-16 ENCOUNTER — Other Ambulatory Visit: Payer: Self-pay | Admitting: Family Medicine

## 2014-11-19 ENCOUNTER — Encounter: Payer: Self-pay | Admitting: Internal Medicine

## 2014-11-19 ENCOUNTER — Ambulatory Visit (INDEPENDENT_AMBULATORY_CARE_PROVIDER_SITE_OTHER): Payer: Medicare Other | Admitting: *Deleted

## 2014-11-19 DIAGNOSIS — I428 Other cardiomyopathies: Secondary | ICD-10-CM

## 2014-11-19 DIAGNOSIS — I429 Cardiomyopathy, unspecified: Secondary | ICD-10-CM | POA: Diagnosis not present

## 2014-11-19 DIAGNOSIS — I5022 Chronic systolic (congestive) heart failure: Secondary | ICD-10-CM | POA: Diagnosis not present

## 2014-11-19 NOTE — Progress Notes (Signed)
Remote ICD transmission.   

## 2014-11-20 LAB — CUP PACEART REMOTE DEVICE CHECK
Battery Remaining Longevity: 65 mo
Battery Remaining Percentage: 69 %
Battery Voltage: 2.96 V
Date Time Interrogation Session: 20160511080016
HIGH POWER IMPEDANCE MEASURED VALUE: 78 Ohm
HighPow Impedance: 78 Ohm
Lead Channel Impedance Value: 1075 Ohm
Lead Channel Impedance Value: 400 Ohm
Lead Channel Pacing Threshold Amplitude: 0.625 V
Lead Channel Pacing Threshold Pulse Width: 0.5 ms
Lead Channel Pacing Threshold Pulse Width: 0.5 ms
Lead Channel Sensing Intrinsic Amplitude: 11.9 mV
Lead Channel Setting Pacing Amplitude: 2 V
Lead Channel Setting Pacing Amplitude: 2 V
Lead Channel Setting Pacing Pulse Width: 0.5 ms
MDC IDC MSMT LEADCHNL LV PACING THRESHOLD AMPLITUDE: 1 V
MDC IDC PG MODEL: 3257
MDC IDC PG SERIAL: 7053988
MDC IDC SET LEADCHNL RV PACING PULSEWIDTH: 0.5 ms
MDC IDC SET LEADCHNL RV SENSING SENSITIVITY: 0.5 mV
MDC IDC SET ZONE DETECTION INTERVAL: 270 ms
MDC IDC SET ZONE DETECTION INTERVAL: 300 ms
Zone Setting Detection Interval: 370 ms

## 2014-11-28 ENCOUNTER — Encounter: Payer: Self-pay | Admitting: Cardiology

## 2014-12-01 ENCOUNTER — Ambulatory Visit (INDEPENDENT_AMBULATORY_CARE_PROVIDER_SITE_OTHER): Payer: Medicare Other | Admitting: Thoracic Surgery (Cardiothoracic Vascular Surgery)

## 2014-12-01 ENCOUNTER — Encounter: Payer: Self-pay | Admitting: Thoracic Surgery (Cardiothoracic Vascular Surgery)

## 2014-12-01 VITALS — BP 119/69 | HR 76 | Resp 16 | Ht 74.0 in | Wt 216.0 lb

## 2014-12-01 DIAGNOSIS — Z953 Presence of xenogenic heart valve: Secondary | ICD-10-CM

## 2014-12-01 DIAGNOSIS — C3412 Malignant neoplasm of upper lobe, left bronchus or lung: Secondary | ICD-10-CM | POA: Diagnosis not present

## 2014-12-01 NOTE — Progress Notes (Signed)
[address]       [place] [address]             [phone]     CARDIOTHORACIC SURGERY OFFICE NOTE  Referring Provider is Rigoberto Noel, MD PCP is Joycelyn Man, MD   HPI:  Patient returns for routine follow-up approximately one year status post wedge resection of stage IA (T1A, N0, M0) non-small cell lung cancer w/ histology c/w well-differentiated adenocarcinoma on 11/29/2013.  His postoperative recovery was uneventful and has been followed carefully ever since by Dr. Julien Nordmann who saw him most recently on 06/30/2014.  He returns to the office today reporting no new problems or complaints. Specifically denies any change in appetite, weight loss, chest pain, cough, or hemoptysis. He has chronic shortness of breath that remained stable. He has no residual pain or other symptoms related to his minithoracotomy incision.  Overall he feels well.   Current Outpatient Prescriptions  Medication Sig Dispense Refill  . albuterol (PROVENTIL HFA;VENTOLIN HFA) 108 (90 BASE) MCG/ACT inhaler Inhale 2 puffs into the lungs every 6 (six) hours as needed for wheezing or shortness of breath. 1 Inhaler 6  . amoxicillin (AMOXIL) 500 MG capsule Take 2,000 mg by mouth See admin instructions. 1 hour prior to dental procedures    . aspirin EC 81 MG tablet Take 1 tablet (81 mg total) by mouth daily. 90 tablet 3  . atorvastatin (LIPITOR) 10 MG tablet Take 1 tablet (10 mg total) by mouth daily. 90 tablet 3  . budesonide-formoterol (SYMBICORT) 160-4.5 MCG/ACT inhaler Inhale 2 puffs into the lungs 2 (two) times daily.    . carvedilol (COREG) 3.125 MG tablet TAKE 1 TABLET BY MOUTH TWICE A DAY WITH MEALS 180 tablet 1  . furosemide (LASIX) 40 MG tablet Take 40 mg by mouth daily. May take 1 extra daily as needed for edema    . irbesartan (AVAPRO) 75 MG tablet TAKE 1 TABLET BY MOUTH DAILY. 90 tablet 0  . nitroGLYCERIN (NITROSTAT) 0.4 MG SL tablet Place 1 tablet (0.4 mg total) under the tongue  every 5 (five) minutes as needed. For chest pain 25 tablet 1  . NON FORMULARY Wears 2L of O2 continuously     No current facility-administered medications for this visit.      Physical Exam:   BP 119/69 mmHg  Pulse 76  Resp 16  Ht '6\' 2"'$  (1.88 m)  Wt 216 lb (97.977 kg)  BMI 27.72 kg/m2  SpO2 96%  General:  Well-appearing  Chest:   Clear to auscultation  CV:   Regular rate and rhythm without murmur  Incisions:  Completely healed  Abdomen:  Soft and nontender  Extremities:  Warm and well-perfused  Diagnostic Tests:  n/a   Impression:  Patient is doing very well up proximally one year status post wedge resection for small, peripherally located early stage non-small cell lung cancer, stage IA (T1A, N0, M0).  The patient is doing well and has no signs or symptoms to suggest the possibility of locally recurrent or distant metastatic disease.  Plan:  The patient will continue to follow-up with Dr. Mckinley Jewel every 6 months as previously planned. In the future he will call and return to see [place] here at [facility] as needed. All of his questions been addressed.  I spent in excess of 10 minutes during the conduct of this office consultation and >50% of this time involved direct face-to-face encounter with the patient for counseling and/or coordination of their  care.   Valentina Gu. Roxy Manns, MD 12/01/2014 10:50 AM

## 2014-12-01 NOTE — Patient Instructions (Addendum)
Continue to follow up with Dr [name] as planned for long term surveillance of lung cancer    Endocarditis is a potentially serious infection of heart valves or inside lining of the heart.  It occurs more commonly in patients with diseased heart valves (such as patient's with aortic or mitral valve disease) and in patients who have undergone heart valve repair or replacement.  Certain surgical and dental procedures may put you at risk, such as dental cleaning, other dental procedures, or any surgery involving the respiratory, urinary, gastrointestinal tract, gallbladder or prostate gland.   To minimize your chances for develooping endocarditis, maintain good oral health and seek prompt medical attention for any infections involving the mouth, teeth, gums, skin or urinary tract.  Always notify your doctor or dentist about your underlying heart valve condition before having any invasive procedures. You will need to take antibiotics before certain procedures.

## 2014-12-11 ENCOUNTER — Other Ambulatory Visit: Payer: Self-pay | Admitting: Internal Medicine

## 2014-12-11 ENCOUNTER — Telehealth: Payer: Self-pay | Admitting: Family Medicine

## 2014-12-11 MED ORDER — IRBESARTAN 75 MG PO TABS: 75.0000 mg | ORAL_TABLET | Freq: Every day | ORAL | Status: DC

## 2014-12-11 NOTE — Telephone Encounter (Signed)
Pt last seen 4/21 by dr todd.  Pt has appt w/ cory on 7/21.  Pt request refill  irbesartan (AVAPRO) 75 MG tablet And states it was denied.  Can pt get enough to get him through until he sees cory? [address]

## 2014-12-21 ENCOUNTER — Other Ambulatory Visit: Payer: Self-pay | Admitting: Family Medicine

## 2014-12-22 ENCOUNTER — Ambulatory Visit (INDEPENDENT_AMBULATORY_CARE_PROVIDER_SITE_OTHER): Payer: Medicare Other | Admitting: Adult Health

## 2014-12-22 ENCOUNTER — Encounter: Payer: Self-pay | Admitting: Adult Health

## 2014-12-22 VITALS — BP 106/58 | HR 86 | Temp 97.3°F | Ht 74.0 in | Wt 215.0 lb

## 2014-12-22 DIAGNOSIS — C3492 Malignant neoplasm of unspecified part of left bronchus or lung: Secondary | ICD-10-CM | POA: Diagnosis not present

## 2014-12-22 DIAGNOSIS — J449 Chronic obstructive pulmonary disease, unspecified: Secondary | ICD-10-CM | POA: Diagnosis not present

## 2014-12-22 DIAGNOSIS — J9611 Chronic respiratory failure with hypoxia: Secondary | ICD-10-CM | POA: Diagnosis not present

## 2014-12-22 NOTE — Assessment & Plan Note (Signed)
Patient is to continue on oxygen at 2 L Encouraged on compliance

## 2014-12-22 NOTE — Patient Instructions (Signed)
Continue on current regimen .  Follow up Dr. Alva  In 4 months and As needed     

## 2014-12-22 NOTE — Assessment & Plan Note (Signed)
Compensated on present regimen  Plan Continue on current regimen

## 2014-12-22 NOTE — Progress Notes (Signed)
Subjective:    Patient ID: [name], male    DOB: 1945/08/04, 69 y.o.   MRN: [id number]  HPI 69 year old male smoker with ischemic cardiomyopathy for followup of COPD, hypoxia and lung cancer.  He has a 40+ PY hx of smoking  No discernible benefit from Advair (was on for several years) , now on Symbicort   Significant tests/ events :  Admitted 08/2013 >> dc'd on O2 Echo 08/2013 -EF 30-35% akinesis of the apex and multiple wall motion abnormalities. RV showed reduced systolic function .  Cath 08/16/13 PCWP 25, Mean RA 11 mmHg  RV 54/5 mmHg  PA: 56/29 mean 39 mmHg  CO: 3.39  He had  pleural disease on right on CT chest & LUL 1.5 cm oblong opacity worrisome for malignancy  PFTs 09/2013 - FEV1 2.18- 61%, ratio 64, FVC 3.40 -71%, DLCO 11.0- 32%  CPET 11/20/13 FEV1 2.28 -58%, MVV 105 (71%), peak V O2 12.7, moderate-to-severe functional limitation due primarily to a circulatory limitation   11/29/13 left VATS with wedge resection of the left upper lobe - Dr. Roxy Manns.-invasive well-differentiated adenocarcinoma , neg LNs, close margins but no involvement   06/10/2014  Chief Complaint  Patient presents with  . Acute Visit    sore throat, nose runny, cough, eyes watery, gets chills at night   Wife sick with same complaints -got flu shot last visit Cough with minimal green phlegm no orthopnea, PND,no pedal edema  His breathing is doing okay  He is compliant with oxygen FU CT scheduled for mid dec  08/22/2014 Follow COPD/Chronic RF on O2 /Lung cancer  Patient returns for a four-month follow-up He remains on Symbicort twice daily Denies any flare of cough or wheezing No emergency room visits or hospitalizations since last visit.  History of stage I A non-small cell lung cancer status post wedge resection of the left upper lobe in May 2015 CT chest in December showed no evidence of recurrence Patient has upcoming CT in June of this year He follows with Dr. Earlie Server on a regular  basis  Continues on oxygen at 2 L continuously >>no changes   12/22/2014 Follow up COPD and chronic respiratory failure on oxygen and history of lung cancer Patient returns for a four-month follow-up. He remains on Symbicort twice daily. He denies any flare cough, wheezing or shortness of breath. Wife did complain that patient does leave off his oxygen at time and has low oxygen levels. Unfortunately, patient and wife were  argumentative during the office visit. We discussed oxygen compliance and dangers of hypoxemia.  Continues to follow with Dr. Earlie Server for history of non-small lung cancer status post wedge resection of the left upper lobe in May 2015. CT chest in December showed no evidence of recurrence. Has a follow-up CT later on this month.  Denies any hemoptysis, chest pain, orthopnea, PND or leg swelling.      Review of Systems neg for any significant sore throat, dysphagia, itching, sneezing, nasal congestion or excess/ purulent secretions, fever, chills, sweats, unintended wt loss, pleuritic or exertional cp, hempoptysis, orthopnea pnd or change in chronic leg swelling. Also denies presyncope, palpitations, heartburn, abdominal pain, nausea, vomiting, diarrhea or change in bowel or urinary habits, dysuria,hematuria, rash, arthralgias, visual complaints, headache, numbness weakness or ataxia.     Objective:   Physical Exam  Gen. Pleasant, elderly, in no distress, chronically ill appearing on oxygen ENT - no lesions, no post nasal drip Neck: No JVD, no thyromegaly, no carotid  bruits Lungs: no use of accessory muscles, no dullness to percussion, decreased breath sounds in the bases Cardiovascular: Rhythm regular, heart sounds  normal, no murmurs or gallops, no peripheral edema Musculoskeletal: No deformities, no cyanosis or clubbing         Assessment & Plan:

## 2014-12-22 NOTE — Assessment & Plan Note (Signed)
Stage I a non-small cell carcinoma of the left lung, status post wedge resection Most recent CT in December showed no evidence of recurrence. Has an upcoming CT chest in June

## 2014-12-23 ENCOUNTER — Other Ambulatory Visit: Payer: Medicare Other

## 2014-12-24 NOTE — Progress Notes (Signed)
Reviewed & agree with plan  

## 2014-12-29 ENCOUNTER — Ambulatory Visit (HOSPITAL_COMMUNITY)
Admission: RE | Admit: 2014-12-29 | Discharge: 2014-12-29 | Disposition: A | Discharge status: Home / Self Care | Payer: Medicare Other | Source: Ambulatory Visit | Attending: Internal Medicine | Admitting: Internal Medicine

## 2014-12-29 ENCOUNTER — Other Ambulatory Visit (HOSPITAL_BASED_OUTPATIENT_CLINIC_OR_DEPARTMENT_OTHER): Payer: Medicare Other

## 2014-12-29 DIAGNOSIS — I517 Cardiomegaly: Secondary | ICD-10-CM | POA: Diagnosis not present

## 2014-12-29 DIAGNOSIS — R911 Solitary pulmonary nodule: Secondary | ICD-10-CM | POA: Diagnosis not present

## 2014-12-29 DIAGNOSIS — K7460 Unspecified cirrhosis of liver: Secondary | ICD-10-CM | POA: Diagnosis not present

## 2014-12-29 DIAGNOSIS — J432 Centrilobular emphysema: Secondary | ICD-10-CM | POA: Diagnosis not present

## 2014-12-29 DIAGNOSIS — R17 Unspecified jaundice: Secondary | ICD-10-CM

## 2014-12-29 DIAGNOSIS — C3412 Malignant neoplasm of upper lobe, left bronchus or lung: Secondary | ICD-10-CM | POA: Diagnosis not present

## 2014-12-29 LAB — CBC WITH DIFFERENTIAL/PLATELET
BASO%: 0.4 % (ref 0.0–2.0)
Basophils Absolute: 0 10*3/uL (ref 0.0–0.1)
EOS%: 2.2 % (ref 0.0–7.0)
Eosinophils Absolute: 0.2 10*3/uL (ref 0.0–0.5)
HCT: 48.2 % (ref 38.4–49.9)
HGB: 16.1 g/dL (ref 13.0–17.1)
LYMPH%: 10.4 % — ABNORMAL LOW (ref 14.0–49.0)
MCH: 31.4 pg (ref 27.2–33.4)
MCHC: 33.4 g/dL (ref 32.0–36.0)
MCV: 94.1 fL (ref 79.3–98.0)
MONO#: 0.6 10*3/uL (ref 0.1–0.9)
MONO%: 9 % (ref 0.0–14.0)
NEUT#: 5.3 10*3/uL (ref 1.5–6.5)
NEUT%: 78 % — ABNORMAL HIGH (ref 39.0–75.0)
Platelets: 143 10*3/uL (ref 140–400)
RBC: 5.12 10*6/uL (ref 4.20–5.82)
RDW: 14.9 % — ABNORMAL HIGH (ref 11.0–14.6)
WBC: 6.8 10*3/uL (ref 4.0–10.3)
lymph#: 0.7 10*3/uL — ABNORMAL LOW (ref 0.9–3.3)

## 2014-12-29 LAB — COMPREHENSIVE METABOLIC PANEL (CC13)
ALBUMIN: 4 g/dL (ref 3.5–5.0)
ALT: 12 U/L (ref 0–55)
ANION GAP: 8 meq/L (ref 3–11)
AST: 15 U/L (ref 5–34)
Alkaline Phosphatase: 67 U/L (ref 40–150)
BUN: 20 mg/dL (ref 7.0–26.0)
CO2: 31 mEq/L — ABNORMAL HIGH (ref 22–29)
Calcium: 9.3 mg/dL (ref 8.4–10.4)
Chloride: 100 mEq/L (ref 98–109)
Creatinine: 1 mg/dL (ref 0.7–1.3)
EGFR: 74 mL/min/{1.73_m2} — ABNORMAL LOW (ref >90)
Glucose: 194 mg/dl — ABNORMAL HIGH (ref 70–140)
Potassium: 4.6 mEq/L (ref 3.5–5.1)
Sodium: 139 mEq/L (ref 136–145)
TOTAL PROTEIN: 6.8 g/dL (ref 6.4–8.3)
Total Bilirubin: 2.87 mg/dL — ABNORMAL HIGH (ref 0.20–1.20)

## 2014-12-29 MED ORDER — IOHEXOL 300 MG/ML  SOLN
80.0000 mL | Freq: Once | INTRAMUSCULAR | Status: AC | PRN
  Administered 2014-12-29: 80 mL via INTRAVENOUS

## 2015-01-05 ENCOUNTER — Encounter: Payer: Self-pay | Admitting: Internal Medicine

## 2015-01-05 ENCOUNTER — Telehealth: Payer: Self-pay | Admitting: Internal Medicine

## 2015-01-05 ENCOUNTER — Ambulatory Visit (HOSPITAL_BASED_OUTPATIENT_CLINIC_OR_DEPARTMENT_OTHER): Payer: Medicare Other | Admitting: Internal Medicine

## 2015-01-05 VITALS — BP 105/57 | HR 79 | Temp 97.4°F | Resp 18 | Ht 74.0 in | Wt 213.8 lb

## 2015-01-05 DIAGNOSIS — C3412 Malignant neoplasm of upper lobe, left bronchus or lung: Secondary | ICD-10-CM

## 2015-01-05 DIAGNOSIS — C3492 Malignant neoplasm of unspecified part of left bronchus or lung: Secondary | ICD-10-CM

## 2015-01-05 NOTE — Progress Notes (Signed)
[facility] [phone]   [phone]  OFFICE PROGRESS NOTE  Joycelyn Man, MD [place] [address]  DIAGNOSIS: Stage IA (T1a, N0, M0) non-small cell lung cancer, well-differentiated adenocarcinoma diagnosed in February of 2015   PRIOR THERAPY: Status post wedge resection of the left upper lobe under the care of Dr. Roxy Manns on 11/29/2013.  CURRENT THERAPY: Observation.  INTERVAL HISTORY: [name] 69 y.o. male returns to the clinic today for six-month follow-up visit. The patient is feeling fine today with no specific complaints except for the baseline shortness of breath and he is currently on home oxygen. He denied having any significant weight loss or night sweats. The patient denied having any nausea, vomiting, abdominal pain, diarrhea or constipation. He has no chest pain, cough or hemoptysis. His serum bilirubin has been elevated over the last few years with unclear etiology. The patient has repeat CT scan of the chest performed recently and he is here for evaluation and discussion of his scan results.  MEDICAL HISTORY: Past Medical History  Diagnosis Date  . Hypertension   . Endocarditis     Bacterial, 2009  . Colon polyps   . Dyslipidemia   . Atrial fibrillation     AV Node ablation January, 2010, for rapid atrial fib  . Cardiomyopathy     non-ischemic  . Ejection fraction < 50%   . Atrial septal defect     Closed with surgery January, 2010  . Mitral regurgitation     Severe symptomatic primary MR due to bacterial endocarditis, treated w/ MVR  . Intracranial hemorrhage     Coumadin cannot be used because of the history of his bleed  . Renal artery stenosis     Mild by history  . Pulmonary hypertension     Moderate  . HLD (hyperlipidemia)   . Automatic implantable cardioverter-defibrillator in situ     LV dysfunction and pacer needed for AV node lesion  . CVA (cerebral vascular accident) 2009   denies residual on 08/14/2013  . CHF (congestive heart failure)   . Myocardial infarction 2010  . Dysrhythmia   . Pre-diabetes   . Pneumonia     "this is my first case" (08/14/2013)  . Spontaneous pneumothorax     right thoracotomy - distant past  . Permanent atrial fibrillation     Originally Coumadin use for atrial fibrillation  //   he had intracerebral hemorrhage with an INR of 2.3 June, 2009. Anticoagulation could no longer be used.  //  Rapid atrial fibrillation after inferior MI October, 2010..........Marland Kitchen AV node ablation done at that time with ICD pacemaker placed (EF 35%).   //   Left atrial appendage tied off at the time of mitral valve surgery January, 2010 (maze pro  . Status post minimally invasive mitral valve replacement with bioprosthetic valve     33 mm Medtronic Mosaic porcine bioprosthesis placed via right mini thoracotomy for bacterial endocarditis complicated by severe MR and CHF   . Prosthetic valve dysfunction     Mild mitral stenosis  . Chronic combined systolic and diastolic CHF (congestive heart failure)   . Pacemaker   . Diabetes mellitus without complication   . Headache(784.0)     related to stroke only  . COPD (chronic obstructive pulmonary disease)     O2- 2 liters, nasal cannula, q night   . COPD GOLD II 01/11/2007    PFT's 09/25/13  FEV1  2.18 (61%)  ratio 64 no change p B2 and DLC0  32% corrects to 68% - trial off advair and acei rec starting  08/23/2013     . Cancer     lung- per pt.   . Lung cancer 11/29/2013    T1N0 Stage Ia non-small cell carcinoma left lung treated with wedge resection    ALLERGIES:  is allergic to anticoagulant compound and warfarin sodium.  MEDICATIONS:  Current Outpatient Prescriptions  Medication Sig Dispense Refill  . aspirin EC 81 MG tablet Take 1 tablet (81 mg total) by mouth daily. 90 tablet 3  . atorvastatin (LIPITOR) 10 MG tablet Take 1 tablet (10 mg total) by mouth daily. 90 tablet 3  . budesonide-formoterol (SYMBICORT)  160-4.5 MCG/ACT inhaler Inhale 2 puffs into the lungs 2 (two) times daily.    . carvedilol (COREG) 3.125 MG tablet TAKE 1 TABLET BY MOUTH TWICE A DAY WITH MEALS 180 tablet 1  . furosemide (LASIX) 40 MG tablet Take 40 mg by mouth daily. May take 1 extra daily as needed for edema    . irbesartan (AVAPRO) 75 MG tablet Take 1 tablet (75 mg total) by mouth daily. 90 tablet 0  . metFORMIN (GLUCOPHAGE) 1000 MG tablet Take 1,000 mg by mouth every morning.  3  . metFORMIN (GLUCOPHAGE) 500 MG tablet Take 500 mg by mouth every evening.    . NON FORMULARY Wears 2L of O2 continuously    . albuterol (PROVENTIL HFA;VENTOLIN HFA) 108 (90 BASE) MCG/ACT inhaler Inhale 2 puffs into the lungs every 6 (six) hours as needed for wheezing or shortness of breath. (Patient not taking: Reported on 01/05/2015) 1 Inhaler 6  . amoxicillin (AMOXIL) 500 MG capsule Take 2,000 mg by mouth See admin instructions. 1 hour prior to dental procedures    . nitroGLYCERIN (NITROSTAT) 0.4 MG SL tablet Place 1 tablet (0.4 mg total) under the tongue every 5 (five) minutes as needed. For chest pain (Patient not taking: Reported on 01/05/2015) 25 tablet 1   No current facility-administered medications for this visit.    SURGICAL HISTORY:  Past Surgical History  Procedure Laterality Date  . Mitral valve replacement Right 07/17/2008    74m Medtronic Mosaic porcine bioprosthesis  . Cardiac defibrillator placement  ~ 2010    [employer] Jude  . Penile prosthesis implant    . Appendectomy    . Mass biopsy Left     neck mass  . Leep    . Cataract extraction w/ intraocular lens  implant, bilateral Bilateral   . Asd repair, secundum  07/17/2008    pericardial patch closure of ASD  . Av node ablation  07/2008    for rapid atrial fib  . Thoracotomy Right 1970's    spontaneous pneumothorax - while in the [employer] . Tonsillectomy    . Insert / replace / remove pacemaker    . Cardiac valve replacement    . Cardiac catheterization    . Video assisted  thoracoscopy (vats)/wedge resection Left 11/29/2013    Procedure: Video assisted thoracoscopy for wedge resection; mini thoracotomy;  Surgeon: CRexene Alberts MD;  [facility]  Service: Thoracic;  Laterality: Left;  . Implantable cardioverter defibrillator (icd) generator change N/A 02/06/2012    Procedure: ICD GENERATOR CHANGE;  Surgeon: GEvans Lance MD;  [facility];  Service: Cardiovascular;  Laterality: N/A;  . Right heart catheterization N/A 08/16/2013    Procedure: RIGHT HEART CATH;  Surgeon: PJosue Hector MD;  [facility];  Service: Cardiovascular;  Laterality: N/A;    REVIEW OF SYSTEMS:  A comprehensive review of systems was negative except for: Respiratory: positive for dyspnea on exertion   PHYSICAL EXAMINATION: General appearance: alert, cooperative, fatigued and no distress Head: Normocephalic, without obvious abnormality, atraumatic Neck: no adenopathy, no JVD, supple, symmetrical, trachea midline and thyroid not enlarged, symmetric, no tenderness/mass/nodules Lymph nodes: Cervical, supraclavicular, and axillary nodes normal. Resp: clear to auscultation bilaterally Back: negative, symmetric, no curvature. ROM normal. No CVA tenderness. Cardio: regular rate and rhythm, S1, S2 normal, no murmur, click, rub or gallop GI: soft, non-tender; bowel sounds normal; no masses,  no organomegaly Extremities: extremities normal, atraumatic, no cyanosis or edema  ECOG PERFORMANCE STATUS: 1 - Symptomatic but completely ambulatory  Blood pressure 105/57, pulse 79, temperature 97.4 F (36.3 C), temperature source Oral, resp. rate 18, height '6\' 2"'$  (1.88 m), weight 213 lb 12.8 oz (96.979 kg), SpO2 95 %.  LABORATORY DATA: Lab Results  Component Value Date   WBC 6.8 12/29/2014   HGB 16.1 12/29/2014   HCT 48.2 12/29/2014   MCV 94.1 12/29/2014   PLT 143 12/29/2014      Chemistry      Component Value Date/Time   NA 139 12/29/2014 0756   NA 138 10/23/2014 0825    K 4.6 12/29/2014 0756   K 5.6* 10/23/2014 0825   CL 100 10/23/2014 0825   CO2 31* 12/29/2014 0756   CO2 34* 10/23/2014 0825   BUN 20.0 12/29/2014 0756   BUN 22 10/23/2014 0825   CREATININE 1.0 12/29/2014 0756   CREATININE 1.13 10/23/2014 0825      Component Value Date/Time   CALCIUM 9.3 12/29/2014 0756   CALCIUM 10.1 10/23/2014 0825   ALKPHOS 67 12/29/2014 0756   ALKPHOS 52 12/01/2013 0400   AST 15 12/29/2014 0756   AST 16 12/01/2013 0400   ALT 12 12/29/2014 0756   ALT 10 12/01/2013 0400   BILITOT 2.87* 12/29/2014 0756   BILITOT 2.6* 12/01/2013 0400       RADIOGRAPHIC STUDIES: Ct Chest W Contrast  12/29/2014   CLINICAL DATA:  Restaging left lung cancer  EXAM: CT CHEST WITH CONTRAST  TECHNIQUE: Multidetector CT imaging of the chest was performed during intravenous contrast administration.  CONTRAST:  37m OMNIPAQUE IOHEXOL 300 MG/ML  SOLN  COMPARISON:  06/23/2014  FINDINGS: Mediastinum: Moderate cardiac enlargement. Aortic atherosclerosis noted. There are calcifications within the RCA and LAD coronary artery. No significant mediastinal or hilar adenopathy.  Lungs/Pleura: No pleural effusion. There is no airspace consolidation identified. Changes of centrilobular and paraseptal emphysema identified. Status post wedge resection of the left upper lobe. No focal evidence to suggest residual or recurrence of tumor. Small left upper lobe lung nodule is stable measuring 3 mm, image 21/series 5. No new or enlarging pulmonary nodules or masses.  Upper Abdomen: The liver has a slightly irregular contour compatible with cirrhosis. The adrenal glands are unremarkable. The visualized portions of the pancreas and spleen are normal. The visualized portions of the kidneys are also on unremarkable.  Musculoskeletal: Multi level thoracic spondylosis identified. No aggressive lytic or sclerotic bone lesions.  IMPRESSION: 1. Stable CT of the chest. Status post wedge resection of the left upper lobe. No  definite findings to suggest local recurrence of disease or metastatic disease. 2. Stable 4 mm left upper lobe lung nodule. Attention on follow-up imaging recommended. 3. Emphysema 4. Aortic atherosclerosis. 5. Cardiac enlargement 6. Cirrhosis.   Electronically Signed   By: TQueen SloughD.  On: 12/29/2014 10:23    ASSESSMENT AND PLAN: This is a very pleasant 69 years old white male recently diagnosed with a stage IA non-small cell lung cancer status post wedge resection of the left upper lobe and he is currently on observation. The recent CT scan of the chest showed no evidence for disease recurrence. I discussed the scan results with the patient. I recommended for him to continue on observation with repeat CT scan of the chest in 6 months. The patient has elevated serum bilirubin for the last few years and I'm not sure of the etiology but this could be secondary to hemolysis in a patient with mitral valve replacement. His other liver enzymes are unremarkable which is clearly against an obstructive or destructive liver disease. The patient would come back for follow-up visit in 6 months after repeating CT scan of the chest. He was advised to call immediately if he has any concerning symptoms in the interval. The patient voices understanding of current disease status and treatment options and is in agreement with the current care plan.  All questions were answered. The patient knows to call the clinic with any problems, questions or concerns. We can certainly see the patient much sooner if necessary.  Disclaimer: This note was dictated with voice recognition software. Similar sounding words can inadvertently be transcribed and may not be corrected upon review.

## 2015-01-05 NOTE — Telephone Encounter (Signed)
Gave adn printed appt sched and avs for pt for DEC

## 2015-01-22 ENCOUNTER — Other Ambulatory Visit (INDEPENDENT_AMBULATORY_CARE_PROVIDER_SITE_OTHER): Payer: Medicare Other

## 2015-01-22 DIAGNOSIS — E119 Type 2 diabetes mellitus without complications: Secondary | ICD-10-CM

## 2015-01-22 LAB — BASIC METABOLIC PANEL
BUN: 20 mg/dL (ref 6–23)
CALCIUM: 9.4 mg/dL (ref 8.4–10.5)
CHLORIDE: 102 meq/L (ref 96–112)
CO2: 31 mEq/L (ref 19–32)
Creatinine, Ser: 0.98 mg/dL (ref 0.40–1.50)
GFR: 80.93 mL/min (ref >60.00)
GLUCOSE: 187 mg/dL — AB (ref 70–99)
Potassium: 4.7 mEq/L (ref 3.5–5.1)
Sodium: 141 mEq/L (ref 135–145)

## 2015-01-22 LAB — HEMOGLOBIN A1C: Hgb A1c MFr Bld: 7.5 % — ABNORMAL HIGH (ref 4.6–6.5)

## 2015-01-22 LAB — MICROALBUMIN / CREATININE URINE RATIO
Creatinine,U: 68.2 mg/dL
Microalb Creat Ratio: 4.7 mg/g (ref 0.0–30.0)
Microalb, Ur: 3.2 mg/dL — ABNORMAL HIGH (ref 0.0–1.9)

## 2015-01-29 ENCOUNTER — Encounter: Payer: Self-pay | Admitting: Adult Health

## 2015-01-29 ENCOUNTER — Ambulatory Visit (INDEPENDENT_AMBULATORY_CARE_PROVIDER_SITE_OTHER): Payer: Medicare Other | Admitting: Adult Health

## 2015-01-29 VITALS — BP 120/60 | Temp 98.3°F | Ht 74.0 in | Wt 216.3 lb

## 2015-01-29 DIAGNOSIS — E119 Type 2 diabetes mellitus without complications: Secondary | ICD-10-CM | POA: Diagnosis not present

## 2015-01-29 DIAGNOSIS — I10 Essential (primary) hypertension: Secondary | ICD-10-CM | POA: Diagnosis not present

## 2015-01-29 DIAGNOSIS — C3492 Malignant neoplasm of unspecified part of left bronchus or lung: Secondary | ICD-10-CM

## 2015-01-29 DIAGNOSIS — Z7689 Persons encountering health services in other specified circumstances: Secondary | ICD-10-CM

## 2015-01-29 DIAGNOSIS — Z7189 Other specified counseling: Secondary | ICD-10-CM

## 2015-01-29 MED ORDER — METFORMIN HCL 1000 MG PO TABS: 1000.0000 mg | ORAL_TABLET | Freq: Two times a day (BID) | ORAL | Status: DC

## 2015-01-29 NOTE — Patient Instructions (Signed)
It was great meeting you today!   Start taking '1000mg'$  Metformin in the morning and in the evening. If you have any nausea, please notify me and go back to 500 mg in the morning and '1000mg'$  in the evening.   Continue to eat healthy and exercise.   Follow up with me in 3 months.

## 2015-01-29 NOTE — Progress Notes (Signed)
HPI:  [name] is here to establish care. He is a very pleasant 69 year old with significant PMH. He most recently was seen by Oncology for follow hx of lung CA. He had CT of chest done during this exam which showed no new cancer. He is to repeat CT in 6 months. His last hospital stay was 12/04/2014 for wedge resection of left upper lobe. Last PCP and physical: 11/04/2014.  Has the following chronic problems that require follow up and concerns today:  Diabetes: - He is currently taking Metformin '500mg'$  every morning and '1000mg'$  at night.  Lab Results  Component Value Date   HGBA1C 7.5* 01/22/2015   He is checking his blood sugars at home on M/W/F and endorses that they are in the 130's.  Hypertension - Well managed on current medication regimen.   He has no other complaints. States " I feel pretty good!"  ROS negative for unless reported above: fevers, chills,feeling poorly, unintentional weight loss, hearing or vision loss, chest pain, palpitations, leg claudication, struggling to breath,Not feeling congested in the chest, no orthopenia, no cough,no wheezing, normal appetite, no soft tissue swelling, no hemoptysis, melena, hematochezia, hematuria, falls, loc, si, or thoughts of self harm.  Immunizations:UTD Diet:Does not follow diabetic diet Exercise: He exercises as much as he can.  Colonoscopy: He did the mail order in February 2016 - Negative   Past Medical History  Diagnosis Date  . Hypertension   . Endocarditis     Bacterial, 2009  . Colon polyps   . Dyslipidemia   . Atrial fibrillation     AV Node ablation January, 2010, for rapid atrial fib  . Cardiomyopathy     non-ischemic  . Ejection fraction < 50%   . Atrial septal defect     Closed with surgery January, 2010  . Mitral regurgitation     Severe symptomatic primary MR due to bacterial endocarditis, treated w/ MVR  . Intracranial hemorrhage     Coumadin cannot be used because of the history of his bleed  .  Renal artery stenosis     Mild by history  . Pulmonary hypertension     Moderate  . HLD (hyperlipidemia)   . Automatic implantable cardioverter-defibrillator in situ     LV dysfunction and pacer needed for AV node lesion  . CVA (cerebral vascular accident) 2009    denies residual on 08/14/2013  . CHF (congestive heart failure)   . Myocardial infarction 2010  . Dysrhythmia   . Pre-diabetes   . Pneumonia     "this is my first case" (08/14/2013)  . Spontaneous pneumothorax     right thoracotomy - distant past  . Permanent atrial fibrillation     Originally Coumadin use for atrial fibrillation  //   he had intracerebral hemorrhage with an INR of 2.3 June, 2009. Anticoagulation could no longer be used.  //  Rapid atrial fibrillation after inferior MI October, 2010..........Marland Kitchen AV node ablation done at that time with ICD pacemaker placed (EF 35%).   //   Left atrial appendage tied off at the time of mitral valve surgery January, 2010 (maze pro  . Status post minimally invasive mitral valve replacement with bioprosthetic valve     33 mm Medtronic Mosaic porcine bioprosthesis placed via right mini thoracotomy for bacterial endocarditis complicated by severe MR and CHF   . Prosthetic valve dysfunction     Mild mitral stenosis  . Chronic combined systolic and diastolic CHF (congestive heart failure)   .  Pacemaker   . Diabetes mellitus without complication   . Headache(784.0)     related to stroke only  . COPD (chronic obstructive pulmonary disease)     O2- 2 liters, nasal cannula, q night   . COPD GOLD II 01/11/2007    PFT's 09/25/13  FEV1  2.18 (61%) ratio 64 no change p B2 and DLC0  32% corrects to 68% - trial off advair and acei rec starting  08/23/2013     . Cancer     lung- per pt.   . Lung cancer 11/29/2013    T1N0 Stage Ia non-small cell carcinoma left lung treated with wedge resection    Past Surgical History  Procedure Laterality Date  . Mitral valve replacement Right 07/17/2008    91m  Medtronic Mosaic porcine bioprosthesis  . Cardiac defibrillator placement  ~ 2010    St Jude  . Penile prosthesis implant    . Appendectomy    . Mass biopsy Left     neck mass  . Leep    . Cataract extraction w/ intraocular lens  implant, bilateral Bilateral   . Asd repair, secundum  07/17/2008    pericardial patch closure of ASD  . Av node ablation  07/2008    for rapid atrial fib  . Thoracotomy Right 1970's    spontaneous pneumothorax - while in the [employer] . Tonsillectomy    . Insert / replace / remove pacemaker    . Cardiac valve replacement    . Cardiac catheterization    . Video assisted thoracoscopy (vats)/wedge resection Left 11/29/2013    Procedure: Video assisted thoracoscopy for wedge resection; mini thoracotomy;  Surgeon: CRexene Alberts MD;  [place] [facility]  Service: Thoracic;  Laterality: Left;  . Implantable cardioverter defibrillator (icd) generator change N/A 02/06/2012    Procedure: ICD GENERATOR CHANGE;  Surgeon: GEvans Lance MD;  [facility];  Service: Cardiovascular;  Laterality: N/A;  . Right heart catheterization N/A 08/16/2013    Procedure: RIGHT HEART CATH;  Surgeon: PJosue Hector MD;  [facility];  Service: Cardiovascular;  Laterality: N/A;    Family History  Problem Relation Age of Onset  . Stomach cancer Father   . Cancer Father   . Stroke      family history  . Stroke Mother     History   Social History  . Marital Status: Married    Spouse Name: JGregary Signs . Number of Children: 0  . Years of Education: College   Occupational History  . Retired     PTour manager  Social History Main Topics  . Smoking status: Former Smoker -- 1.00 packs/day for 45 years    Types: Cigarettes    Quit date: 08/11/2013  . Smokeless tobacco: Never Used     Comment: 08/14/2013 "quit smoking in 2009"  . Alcohol Use: Yes     Comment: 08/14/2013 "used to drink beer; quit:in 1982"  . Drug Use: No  . Sexual Activity: Yes   Other Topics Concern   . None   Social History Narrative   Patient lives at home with his spouse.   Caffeine Use: none     Current outpatient prescriptions:  .  albuterol (PROVENTIL HFA;VENTOLIN HFA) 108 (90 BASE) MCG/ACT inhaler, Inhale 2 puffs into the lungs every 6 (six) hours as needed for wheezing or shortness of breath., Disp: 1 Inhaler, Rfl: 6 .  aspirin EC 81 MG tablet, Take 1 tablet (81 mg  total) by mouth daily., Disp: 90 tablet, Rfl: 3 .  atorvastatin (LIPITOR) 10 MG tablet, Take 1 tablet (10 mg total) by mouth daily., Disp: 90 tablet, Rfl: 3 .  budesonide-formoterol (SYMBICORT) 160-4.5 MCG/ACT inhaler, Inhale 2 puffs into the lungs 2 (two) times daily., Disp: , Rfl:  .  carvedilol (COREG) 3.125 MG tablet, TAKE 1 TABLET BY MOUTH TWICE A DAY WITH MEALS, Disp: 180 tablet, Rfl: 1 .  furosemide (LASIX) 40 MG tablet, Take 40 mg by mouth daily. May take 1 extra daily as needed for edema, Disp: , Rfl:  .  irbesartan (AVAPRO) 75 MG tablet, Take 1 tablet (75 mg total) by mouth daily., Disp: 90 tablet, Rfl: 0 .  metFORMIN (GLUCOPHAGE) 1000 MG tablet, Take 1,000 mg by mouth every morning., Disp: , Rfl: 3 .  metFORMIN (GLUCOPHAGE) 500 MG tablet, Take 500 mg by mouth every evening., Disp: , Rfl:  .  nitroGLYCERIN (NITROSTAT) 0.4 MG SL tablet, Place 1 tablet (0.4 mg total) under the tongue every 5 (five) minutes as needed. For chest pain, Disp: 25 tablet, Rfl: 1 .  NON FORMULARY, Wears 2L of O2 continuously, Disp: , Rfl:   EXAM:  Filed Vitals:   01/29/15 1344  BP: 120/60  Temp: 98.3 F (36.8 C)    Body mass index is 27.76 kg/(m^2).  GENERAL: vitals reviewed and listed above, alert, oriented, appears well hydrated and in no acute distress . Wearing [place] with 2 L O2.   HEENT: atraumatic, conjunttiva clear, no obvious abnormalities on inspection of external nose and ears. Wearing hearing aids. No cerumen impaction  NECK: Neck is soft and supple without masses, no adenopathy or thyromegaly, trachea midline, no  JVD. Normal range of motion.   LUNGS: clear to auscultation bilaterally, no wheezes, rales or rhonchi, good air movement  CV: Regular rate and rhythm, normal S1/S2, no audible murmurs, gallops, or rubs. No carotid bruit and no peripheral edema. Pacemaker on right chest wall  MS: moves all extremities without noticeable abnormality. No edema noted  Abd: soft/nontender/nondistended/normal bowel sounds   Skin: warm and dry, no rash. Varicose veins in bilateral legs  Extremities: No clubbing, cyanosis, or edema. Capillary refill is WNL. Pulses intact bilaterally in upper and lower extremities.   Neuro: CN II-XII intact, sensation and reflexes normal throughout, 5/5 muscle strength in bilateral upper and lower extremities. Normal finger to nose. Normal rapid alternating movements.   PSYCH: pleasant and cooperative, no obvious depression or anxiety  ASSESSMENT AND PLAN:  Discussed the following assessment and plan:  1. Encounter to establish care - Follow up in 3 months for A1c recheck - Follow up in April for next CPE - Follow up as needed  2. Type 2 diabetes mellitus without complication - metFORMIN (GLUCOPHAGE) 1000 MG tablet; Take 1 tablet (1,000 mg total) by mouth 2 (two) times daily with a meal.  Dispense: 60 tablet; Refill: 3 - Hemoglobin A1c; Future - Basic metabolic panel; Future - If any nausea, decrease dose by '500mg'$  in the morning - Information given on diabetic diet - Continue to monitor blood sugar  3. Essential hypertension - Controlled- no change  4. Malignant neoplasm of left lung, unspecified part of lung -Seen by Oncology in June 2016 for follow hx of lung CA. He had CT of chest done during this exam which showed no new cancer. He is to repeat CT in 6 months.  -We reviewed the PMH, PSH, FH, SH, Meds and Allergies. -We provided refills for any medications we  will prescribe as needed. -We addressed current concerns per orders and patient instructions. -We have  asked for records for pertinent exams, studies, vaccines and notes from previous providers. -We have advised patient to follow up per instructions below.   -Patient advised to return or notify a provider immediately if symptoms worsen or persist or new concerns arise.  There are no Patient Instructions on file for this visit.   [name], AGNP

## 2015-03-03 ENCOUNTER — Ambulatory Visit (INDEPENDENT_AMBULATORY_CARE_PROVIDER_SITE_OTHER): Payer: Medicare Other | Admitting: Internal Medicine

## 2015-03-03 ENCOUNTER — Encounter: Payer: Self-pay | Admitting: Internal Medicine

## 2015-03-03 VITALS — BP 126/72 | HR 98 | Ht 73.0 in | Wt 212.8 lb

## 2015-03-03 DIAGNOSIS — I482 Chronic atrial fibrillation, unspecified: Secondary | ICD-10-CM

## 2015-03-03 DIAGNOSIS — I429 Cardiomyopathy, unspecified: Secondary | ICD-10-CM | POA: Diagnosis not present

## 2015-03-03 DIAGNOSIS — I428 Other cardiomyopathies: Secondary | ICD-10-CM

## 2015-03-03 DIAGNOSIS — I5042 Chronic combined systolic (congestive) and diastolic (congestive) heart failure: Secondary | ICD-10-CM

## 2015-03-03 DIAGNOSIS — Z95810 Presence of automatic (implantable) cardiac defibrillator: Secondary | ICD-10-CM

## 2015-03-03 LAB — CUP PACEART INCLINIC DEVICE CHECK
Battery Remaining Longevity: 62.4 mo
Date Time Interrogation Session: 20160823131920
HighPow Impedance: 72 Ohm
Lead Channel Pacing Threshold Amplitude: 0.625 V
Lead Channel Pacing Threshold Amplitude: 0.875 V
Lead Channel Pacing Threshold Pulse Width: 0.5 ms
Lead Channel Sensing Intrinsic Amplitude: 11.9 mV
Lead Channel Setting Pacing Amplitude: 2 V
Lead Channel Setting Pacing Amplitude: 2 V
Lead Channel Setting Pacing Pulse Width: 0.5 ms
MDC IDC MSMT LEADCHNL LV IMPEDANCE VALUE: 1187.5 Ohm
MDC IDC MSMT LEADCHNL LV PACING THRESHOLD PULSEWIDTH: 0.5 ms
MDC IDC MSMT LEADCHNL RV IMPEDANCE VALUE: 450 Ohm
MDC IDC SET LEADCHNL RV PACING PULSEWIDTH: 0.5 ms
MDC IDC SET LEADCHNL RV SENSING SENSITIVITY: 0.5 mV
MDC IDC SET ZONE DETECTION INTERVAL: 270 ms
MDC IDC SET ZONE DETECTION INTERVAL: 300 ms
MDC IDC STAT BRADY RA PERCENT PACED: 0 %
MDC IDC STAT BRADY RV PERCENT PACED: 99.32 %
Pulse Gen Model: 3257
Pulse Gen Serial Number: 7053988
Zone Setting Detection Interval: 370 ms

## 2015-03-03 NOTE — Patient Instructions (Addendum)
  Medication Instructions:  Your physician recommends that you continue on your current medications as directed. Please refer to the Current Medication list given to you today.   Labwork: None ordred  Testing/Procedures: None ordered  Follow-Up: Your physician wants you to follow-up in: 12 months with Dr Knox Saliva will receive a reminder letter in the mail two months in advance. If you don't receive a letter, please call our office to schedule the follow-up appointment.   Remote monitoring is used to monitor your  ICD from home. This monitoring reduces the number of office visits required to check your device to one time per year. It allows [place] to keep an eye on the functioning of your device to ensure it is working properly. You are scheduled for a device check from home on 06/02/15. You may send your transmission at any time that day. If you have a wireless device, the transmission will be sent automatically. After your physician reviews your transmission, you will receive a postcard with your next transmission date.    Any Other Special Instructions Will Be Listed Below (If Applicable).

## 2015-03-05 ENCOUNTER — Encounter: Payer: Self-pay | Admitting: Internal Medicine

## 2015-03-05 NOTE — Assessment & Plan Note (Addendum)
His ventricular rate is controlled. Will follow. Not a candidate for systemic anti-coagulation in the setting of intracerebral bleeding

## 2015-03-05 NOTE — Assessment & Plan Note (Signed)
His symptoms are class 2-3. Difficult to assess as he also has chronic lung disease on oxygen. He will continue his current meds.

## 2015-03-05 NOTE — Progress Notes (Signed)
HPI [name] returns today for followup. He is a very pleasant 69 year old man with multiple medical problems including a non-ischemic cardiomyopathy, chronic systolic heart failure, atrial fibrillation, unable to take Coumadin, status post biventricular ICD implantation secondary to complete heart block. In the interim, he has been diagnosed with lung CA and undergone resection. His convalescence has been uneventful.  He continues to walk but in a reduced capacity. He denies chest pain or shortness of breath. No peripheral edema.His blood pressure has been low.  He requires home oxygen.  Allergies  Allergen Reactions  . Anticoagulant Compound Other (See Comments)    Pt had intracranial bleed, therefore all anticoagulation is contra-indicated per Dr. Ron Parker.  . Warfarin Sodium Other (See Comments)    Pt had intracranial bleed, therefore all anticoagulation is contra-indicated per Dr. Ron Parker.      Current Outpatient Prescriptions  Medication Sig Dispense Refill  . albuterol (PROVENTIL HFA;VENTOLIN HFA) 108 (90 BASE) MCG/ACT inhaler Inhale 2 puffs into the lungs every 6 (six) hours as needed for wheezing or shortness of breath. 1 Inhaler 6  . aspirin EC 81 MG tablet Take 1 tablet (81 mg total) by mouth daily. 90 tablet 3  . atorvastatin (LIPITOR) 10 MG tablet Take 1 tablet (10 mg total) by mouth daily. 90 tablet 3  . budesonide-formoterol (SYMBICORT) 160-4.5 MCG/ACT inhaler Inhale 2 puffs into the lungs 2 (two) times daily.    . carvedilol (COREG) 3.125 MG tablet TAKE 1 TABLET BY MOUTH TWICE A DAY WITH MEALS 180 tablet 1  . furosemide (LASIX) 40 MG tablet Take 40 mg by mouth daily. May take 1 extra daily as needed for edema    . irbesartan (AVAPRO) 75 MG tablet Take 1 tablet (75 mg total) by mouth daily. 90 tablet 0  . metFORMIN (GLUCOPHAGE) 1000 MG tablet Take 1 tablet (1,000 mg total) by mouth 2 (two) times daily with a meal. 60 tablet 3  . nitroGLYCERIN (NITROSTAT) 0.4 MG SL tablet Place 1 tablet  (0.4 mg total) under the tongue every 5 (five) minutes as needed. For chest pain 25 tablet 1  . NON FORMULARY Wears 2L of O2 continuously     No current facility-administered medications for this visit.     Past Medical History  Diagnosis Date  . Hypertension   . Endocarditis     Bacterial, 2009  . Colon polyps   . Dyslipidemia   . Atrial fibrillation     AV Node ablation January, 2010, for rapid atrial fib  . Cardiomyopathy     non-ischemic  . Ejection fraction < 50%   . Atrial septal defect     Closed with surgery January, 2010  . Mitral regurgitation     Severe symptomatic primary MR due to bacterial endocarditis, treated w/ MVR  . Intracranial hemorrhage     Coumadin cannot be used because of the history of his bleed  . Renal artery stenosis     Mild by history  . Pulmonary hypertension     Moderate  . HLD (hyperlipidemia)   . Automatic implantable cardioverter-defibrillator in situ     LV dysfunction and pacer needed for AV node lesion  . CVA (cerebral vascular accident) 2009    denies residual on 08/14/2013  . CHF (congestive heart failure)   . Myocardial infarction 2010  . Dysrhythmia   . Pneumonia     "this is my first case" (08/14/2013)  . Spontaneous pneumothorax     right thoracotomy - distant past  .  Permanent atrial fibrillation     Originally Coumadin use for atrial fibrillation  //   he had intracerebral hemorrhage with an INR of 2.3 June, 2009. Anticoagulation could no longer be used.  //  Rapid atrial fibrillation after inferior MI October, 2010..........Marland Kitchen AV node ablation done at that time with ICD pacemaker placed (EF 35%).   //   Left atrial appendage tied off at the time of mitral valve surgery January, 2010 (maze pro  . Status post minimally invasive mitral valve replacement with bioprosthetic valve     33 mm Medtronic Mosaic porcine bioprosthesis placed via right mini thoracotomy for bacterial endocarditis complicated by severe MR and CHF   . Prosthetic  valve dysfunction     Mild mitral stenosis  . Chronic combined systolic and diastolic CHF (congestive heart failure)   . Pacemaker   . Diabetes mellitus without complication   . Headache(784.0)     related to stroke only  . COPD (chronic obstructive pulmonary disease)     O2- 2 liters, nasal cannula, q night   . COPD GOLD II 01/11/2007    PFT's 09/25/13  FEV1  2.18 (61%) ratio 64 no change p B2 and DLC0  32% corrects to 68% - trial off advair and acei rec starting  08/23/2013     . Lung cancer 11/29/2013    T1N0 Stage Ia non-small cell carcinoma left lung treated with wedge resection    ROS:   All systems reviewed and negative except as noted in the HPI.   Past Surgical History  Procedure Laterality Date  . Mitral valve replacement Right 07/17/2008    45m Medtronic Mosaic porcine bioprosthesis  . Cardiac defibrillator placement  ~ 2010    St Jude  . Penile prosthesis implant    . Appendectomy    . Mass biopsy Left     neck mass  . Leep    . Cataract extraction w/ intraocular lens  implant, bilateral Bilateral   . Asd repair, secundum  07/17/2008    pericardial patch closure of ASD  . Av node ablation  07/2008    for rapid atrial fib  . Thoracotomy Right 1970's    spontaneous pneumothorax - while in the [employer] . Tonsillectomy    . Insert / replace / remove pacemaker    . Cardiac valve replacement    . Cardiac catheterization    . Video assisted thoracoscopy (vats)/wedge resection Left 11/29/2013    Procedure: Video assisted thoracoscopy for wedge resection; mini thoracotomy;  Surgeon: CRexene Alberts MD;  [place] [facility]  Service: Thoracic;  Laterality: Left;  . Implantable cardioverter defibrillator (icd) generator change N/A 02/06/2012    Procedure: ICD GENERATOR CHANGE;  Surgeon: GEvans Lance MD;  [facility];  Service: Cardiovascular;  Laterality: N/A;  . Right heart catheterization N/A 08/16/2013    Procedure: RIGHT HEART CATH;  Surgeon: PJosue Hector MD;   [facility];  Service: Cardiovascular;  Laterality: N/A;     Family History  Problem Relation Age of Onset  . Stomach cancer Father   . Cancer Father   . Stroke      family history  . Stroke Mother      Social History   Social History  . Marital Status: Married    Spouse Name: JGregary Signs . Number of Children: 0  . Years of Education: College   Occupational History  . Retired     PHotel manager  History Main Topics  . Smoking status: Former Smoker -- 1.00 packs/day for 45 years    Types: Cigarettes    Quit date: 08/11/2013  . Smokeless tobacco: Never Used     Comment: 08/14/2013 "quit smoking in 2009"  . Alcohol Use: Yes     Comment: 08/14/2013 "used to drink beer; quit:in 1982"  . Drug Use: No  . Sexual Activity: Yes   Other Topics Concern  . Not on file   Social History Narrative   Patient lives at home with his spouse.   Caffeine Use: none   Worked for the post office   Has two boys and a girl. All live local.      BP 126/72 mmHg  Pulse 98  Ht '6\' 1"'$  (1.854 m)  Wt 212 lb 12.8 oz (96.525 kg)  BMI 28.08 kg/m2  Physical Exam:  stable appearing 69 year old man, chronically ill, NAD HEENT: Unremarkable Neck:  7 cm JVD, no thyromegally, wearing nasal cannula Back:  No CVA tenderness Lungs:  Clear with no wheezes, rales, or rhonchi. HEART:  Regular rate rhythm, no murmurs, no rubs, no clicks Abd:  soft, positive bowel sounds, no organomegally, no rebound, no guarding Ext:  2 plus pulses, no edema, no cyanosis, no clubbing Skin:  No rashes no nodules Neuro:  CN II through XII intact, motor grossly intact   DEVICE  Normal device function.  See PaceArt for details.   Assess/Plan:

## 2015-03-05 NOTE — Assessment & Plan Note (Signed)
His St. Jude BiV ICD is working normally. Will recheck in several months.

## 2015-03-06 ENCOUNTER — Encounter: Payer: Self-pay | Admitting: Cardiology

## 2015-03-06 ENCOUNTER — Ambulatory Visit (INDEPENDENT_AMBULATORY_CARE_PROVIDER_SITE_OTHER): Payer: Medicare Other | Admitting: Cardiology

## 2015-03-06 VITALS — BP 110/54 | HR 76 | Ht 73.0 in | Wt 211.8 lb

## 2015-03-06 DIAGNOSIS — I428 Other cardiomyopathies: Secondary | ICD-10-CM

## 2015-03-06 DIAGNOSIS — R0989 Other specified symptoms and signs involving the circulatory and respiratory systems: Secondary | ICD-10-CM

## 2015-03-06 DIAGNOSIS — J9610 Chronic respiratory failure, unspecified whether with hypoxia or hypercapnia: Secondary | ICD-10-CM

## 2015-03-06 DIAGNOSIS — I5042 Chronic combined systolic (congestive) and diastolic (congestive) heart failure: Secondary | ICD-10-CM

## 2015-03-06 DIAGNOSIS — I482 Chronic atrial fibrillation, unspecified: Secondary | ICD-10-CM

## 2015-03-06 DIAGNOSIS — I429 Cardiomyopathy, unspecified: Secondary | ICD-10-CM

## 2015-03-06 DIAGNOSIS — Z95810 Presence of automatic (implantable) cardiac defibrillator: Secondary | ICD-10-CM | POA: Diagnosis not present

## 2015-03-06 DIAGNOSIS — Z8679 Personal history of other diseases of the circulatory system: Secondary | ICD-10-CM

## 2015-03-06 DIAGNOSIS — Z953 Presence of xenogenic heart valve: Secondary | ICD-10-CM

## 2015-03-06 DIAGNOSIS — R9430 Abnormal result of cardiovascular function study, unspecified: Secondary | ICD-10-CM

## 2015-03-06 NOTE — Assessment & Plan Note (Signed)
His volume status remains quite stable.

## 2015-03-06 NOTE — Assessment & Plan Note (Signed)
The patient had stage IA non-small cell carcinoma of the left lung treated by wedge resection. He is recovered nicely from this.

## 2015-03-06 NOTE — Assessment & Plan Note (Signed)
He has permanent atrial fibrillation. Initially he had been on Coumadin. He had an intracerebral bleed with an INR of 2.3. After that time he could no longer be anticoagulated. He had rapid atrial fibrillation after an inferior MI in October, 2010. His atrial fib rate could never be controlled. Therefore he received AV node ablation with an ICD at that time. Later his left atrial appendage was tied off when he had mitral valve surgery. Maze procedure was not done because of his previous AV node ablation.

## 2015-03-06 NOTE — Assessment & Plan Note (Signed)
Originally the patient had an MI in 2009. Cardiac catheterization could not be done at that time because he could not receive any antiplatelets medications. Catheterization was done in 2010 in preparation for mitral valve surgery. Coronaries were normal. Etiology of the original MI was never known.

## 2015-03-06 NOTE — Patient Instructions (Signed)
Medication Instructions:  Same-No changes  Labwork: None  Testing/Procedures: None  Follow-Up: Your physician wants you to follow-up in: 6 months with Dr Marlou Porch. You will receive a reminder letter in the mail two months in advance. If you don't receive a letter, please call our office to schedule the follow-up appointment.

## 2015-03-06 NOTE — Assessment & Plan Note (Addendum)
The patient received a porcine mitral bioprosthesis for bacterial endocarditis complicated by severe MR in the past. He has done well over many years since that time.  Despite the patient's multiple medical problems over the years, he is stable. He never complains. He is very appreciative of all the care that he is received.

## 2015-03-06 NOTE — Assessment & Plan Note (Signed)
The patient had an intracerebral bleed in 2009 with an INR of 2.3. After that time he could no longer be anticoagulated.

## 2015-03-06 NOTE — Assessment & Plan Note (Signed)
The patient had a second ICD placed in January, 2010. This was placed several days after his mitral valve surgery.

## 2015-03-06 NOTE — Progress Notes (Signed)
Cardiology Office Note   Date:  03/06/2015   ID:  [name], DOB October 24, 1945, MRN [id number]  PCP:  [name], NP  Cardiologist:  Dola Argyle, MD   Chief Complaint  Patient presents with  . Appointment    Follow-up atrial fibrillation and mitral valve disease.      History of Present Illness: [name] is a 69 y.o. male who presents for follow-up of his mitral valve disease and atrial fibrillation. He continues to do remarkably well. He is stable. His history is very complex. I have reviewed his problems in the problem was below. He had his mitral valve repaired many years ago. He had AV node ablation for atrial fibrillation rate that could not be controlled. He cannot be anticoagulated because of a prior CNS bleed. His left atrial appendage was tied off. Fortunately he has continued without any sign of a CVA over all of these years. He then developed lung cancer and has responded very well to treatment. He does wear continuous oxygen. He has always been very appreciative of everyone's care.  Past Medical History  Diagnosis Date  . Hypertension   . Endocarditis     Bacterial, 2009  . Colon polyps   . Dyslipidemia   . Atrial fibrillation     AV Node ablation January, 2010, for rapid atrial fib  . Cardiomyopathy     non-ischemic  . Ejection fraction < 50%   . Atrial septal defect     Closed with surgery January, 2010  . Mitral regurgitation     Severe symptomatic primary MR due to bacterial endocarditis, treated w/ MVR  . Intracranial hemorrhage     Coumadin cannot be used because of the history of his bleed  . Renal artery stenosis     Mild by history  . Pulmonary hypertension     Moderate  . HLD (hyperlipidemia)   . Automatic implantable cardioverter-defibrillator in situ     LV dysfunction and pacer needed for AV node lesion  . CVA (cerebral vascular accident) 2009    denies residual on 08/14/2013  . CHF (congestive heart failure)   . Myocardial  infarction 2010  . Dysrhythmia   . Pneumonia     "this is my first case" (08/14/2013)  . Spontaneous pneumothorax     right thoracotomy - distant past  . Permanent atrial fibrillation     Originally Coumadin use for atrial fibrillation  //   he had intracerebral hemorrhage with an INR of 2.3 June, 2009. Anticoagulation could no longer be used.  //  Rapid atrial fibrillation after inferior MI October, 2010..........Marland Kitchen AV node ablation done at that time with ICD pacemaker placed (EF 35%).   //   Left atrial appendage tied off at the time of mitral valve surgery January, 2010 (maze pro  . Status post minimally invasive mitral valve replacement with bioprosthetic valve     33 mm Medtronic Mosaic porcine bioprosthesis placed via right mini thoracotomy for bacterial endocarditis complicated by severe MR and CHF   . Prosthetic valve dysfunction     Mild mitral stenosis  . Chronic combined systolic and diastolic CHF (congestive heart failure)   . Pacemaker   . Diabetes mellitus without complication   . Headache(784.0)     related to stroke only  . COPD (chronic obstructive pulmonary disease)     O2- 2 liters, nasal cannula, q night   . COPD GOLD II 01/11/2007    PFT's 09/25/13  FEV1  2.18 (61%) ratio 64 no change p B2 and DLC0  32% corrects to 68% - trial off advair and acei rec starting  08/23/2013     . Lung cancer 11/29/2013    T1N0 Stage Ia non-small cell carcinoma left lung treated with wedge resection    Past Surgical History  Procedure Laterality Date  . Mitral valve replacement Right 07/17/2008    20m Medtronic Mosaic porcine bioprosthesis  . Cardiac defibrillator placement  ~ 2010    St Jude  . Penile prosthesis implant    . Appendectomy    . Mass biopsy Left     neck mass  . Leep    . Cataract extraction w/ intraocular lens  implant, bilateral Bilateral   . Asd repair, secundum  07/17/2008    pericardial patch closure of ASD  . Av node ablation  07/2008    for rapid atrial fib  .  Thoracotomy Right 1970's    spontaneous pneumothorax - while in the [employer] . Tonsillectomy    . Insert / replace / remove pacemaker    . Cardiac valve replacement    . Cardiac catheterization    . Video assisted thoracoscopy (vats)/wedge resection Left 11/29/2013    Procedure: Video assisted thoracoscopy for wedge resection; mini thoracotomy;  Surgeon: CRexene Alberts MD;  [place] [facility]  Service: Thoracic;  Laterality: Left;  . Implantable cardioverter defibrillator (icd) generator change N/A 02/06/2012    Procedure: ICD GENERATOR CHANGE;  Surgeon: GEvans Lance MD;  [facility];  Service: Cardiovascular;  Laterality: N/A;  . Right heart catheterization N/A 08/16/2013    Procedure: RIGHT HEART CATH;  Surgeon: PJosue Hector MD;  [facility];  Service: Cardiovascular;  Laterality: N/A;    Patient Active Problem List   Diagnosis Date Noted  . [facility]    Priority: High  . Dyslipidemia     Priority: High  . Status post minimally invasive mitral valve replacement with bioprosthetic valve     Priority: Medium  . Permanent atrial fibrillation     Priority: Medium  . Ejection fraction < 50%     Priority: Medium  . ICD (implantable cardioverter-defibrillator), biventricular, in situ     Priority: Medium  . Hepatic cirrhosis 09/25/2014  . History of colonic polyps 09/25/2014  . Serum total bilirubin elevated 06/30/2014  . Follow-up examination, following unspecified surgery 01/27/2014  . Lung cancer 11/29/2013  . Prosthetic valve dysfunction   . Chronic combined systolic and diastolic CHF (congestive heart failure)   . Diabetes 11/04/2013  . Myocardial infarction 11/01/2013  . Cardiomyopathy, nonischemic 11/01/2013  . H/O intracranial hemorrhage 08/13/2013  . H/O endocarditis 08/13/2013  . H/O: CVA (cerebrovascular accident) 08/13/2013  . H/O atrioventricular nodal ablation   . Chronic systolic CHF  (congestive heart failure) 01/08/2012  . Hypertension   . Renal artery stenosis   . Pulmonary hypertension   . CARCINOMA, SKIN, SQUAMOUS CELL 04/20/2009  . COLONIC POLYPS, ADENOMATOUS 03/22/2007  . COPD GOLD II 01/11/2007  . GERD 01/11/2007      Current Outpatient Prescriptions  Medication Sig Dispense Refill  . albuterol (PROVENTIL HFA;VENTOLIN HFA) 108 (90 BASE) MCG/ACT inhaler Inhale 2 puffs into the lungs every 6 (six) hours as needed for wheezing or shortness of breath. 1 Inhaler 6  . aspirin EC 81 MG tablet Take 1 tablet (81 mg total) by mouth daily. 90 tablet 3  . atorvastatin (LIPITOR) 10 MG tablet  Take 1 tablet (10 mg total) by mouth daily. 90 tablet 3  . budesonide-formoterol (SYMBICORT) 160-4.5 MCG/ACT inhaler Inhale 2 puffs into the lungs 2 (two) times daily.    . carvedilol (COREG) 3.125 MG tablet TAKE 1 TABLET BY MOUTH TWICE A DAY WITH MEALS 180 tablet 1  . furosemide (LASIX) 40 MG tablet Take 40 mg by mouth daily. May take 1 extra daily as needed for edema    . irbesartan (AVAPRO) 75 MG tablet Take 1 tablet (75 mg total) by mouth daily. 90 tablet 0  . metFORMIN (GLUCOPHAGE) 1000 MG tablet Take 1 tablet (1,000 mg total) by mouth 2 (two) times daily with a meal. 60 tablet 3  . nitroGLYCERIN (NITROSTAT) 0.4 MG SL tablet Place 1 tablet (0.4 mg total) under the tongue every 5 (five) minutes as needed. For chest pain 25 tablet 1  . NON FORMULARY Wears 2L of O2 continuously     No current facility-administered medications for this visit.    Allergies:   Anticoagulant compound and Warfarin sodium    Social History:  The patient  reports that he quit smoking about 18 months ago. His smoking use included Cigarettes. He has a 45 pack-year smoking history. He has never used smokeless tobacco. He reports that he drinks alcohol. He reports that he does not use illicit drugs.   Family History:  The patient's family history includes Cancer in his father; Stomach cancer in his father;  Stroke in his mother and another family member.    ROS:  Please see the history of present illness.      Patient denies fever, chills, headache, sweats, rash, change in vision, change in hearing, chest pain, cough, nausea or vomiting, urinary symptoms. All other systems are reviewed and are negative.   PHYSICAL EXAM: VS:  BP 110/54 mmHg  Pulse 76  Ht '6\' 1"'$  (1.854 m)  Wt 211 lb 12.8 oz (96.072 kg)  BMI 27.95 kg/m2 , Patient is wearing his home O2. He is oriented to person time and place. Affect is normal. Head is atraumatic. Sclera and conjunctiva are normal. There is no jugulovenous distention. Lungs are clear. Respiratory effort is not labored. Cardiac exam reveals an S1 and S2. Abdomen is soft. There is no peripheral edema. There are no skin rashes. Neurologic is grossly intact.  EKG:   EKG is done today and reviewed by me. His rhythm is paced.   Recent Labs: 12/29/2014: ALT 12; HGB 16.1; Platelets 143 01/22/2015: BUN 20; Creatinine, Ser 0.98; Potassium 4.7; Sodium 141    Lipid Panel    Component Value Date/Time   CHOL 105 07/01/2013 0957   TRIG 125.0 07/01/2013 0957   HDL 40.80 07/01/2013 0957   CHOLHDL 3 07/01/2013 0957   VLDL 25.0 07/01/2013 0957   LDLCALC 39 07/01/2013 0957   LDLDIRECT 80.2 03/31/2011 1411      Wt Readings from Last 3 Encounters:  03/06/15 211 lb 12.8 oz (96.072 kg)  03/03/15 212 lb 12.8 oz (96.525 kg)  01/29/15 216 lb 4.8 oz (98.113 kg)      Current medicines are reviewed  The patient understands his medications.     ASSESSMENT AND PLAN:

## 2015-03-06 NOTE — Assessment & Plan Note (Signed)
His ejection fraction has been reduced since at least 2010. Most recent ejection fraction is 35% by echo in 2015. He is on the medications that he can tolerate. He has an ICD in place.

## 2015-03-06 NOTE — Assessment & Plan Note (Signed)
In the past few years he's had significant problems with COPD. He wears continuous oxygen.

## 2015-03-08 ENCOUNTER — Other Ambulatory Visit: Payer: Self-pay | Admitting: Family Medicine

## 2015-03-12 NOTE — Patient Outreach (Signed)
[facility]) Care Management  03/12/2015  BROADY LAFOY [birth year] [id number]   Referral from [place] [facility], assigned Dannielle Huh, RN to outreach.  Thanks, Ronnell Freshwater. [place], [place] Assistant Phone: [phone] Fax: [phone]

## 2015-03-13 IMAGING — CR DG CHEST 2V
2 series · 2 of 2 positions shown · non-contrast
Comparison: PA and lateral chest 02/06/2012.

CLINICAL DATA: Shortness of breath.  Hypoxia.

EXAM:
CHEST  2 VIEW

[w chest pa]
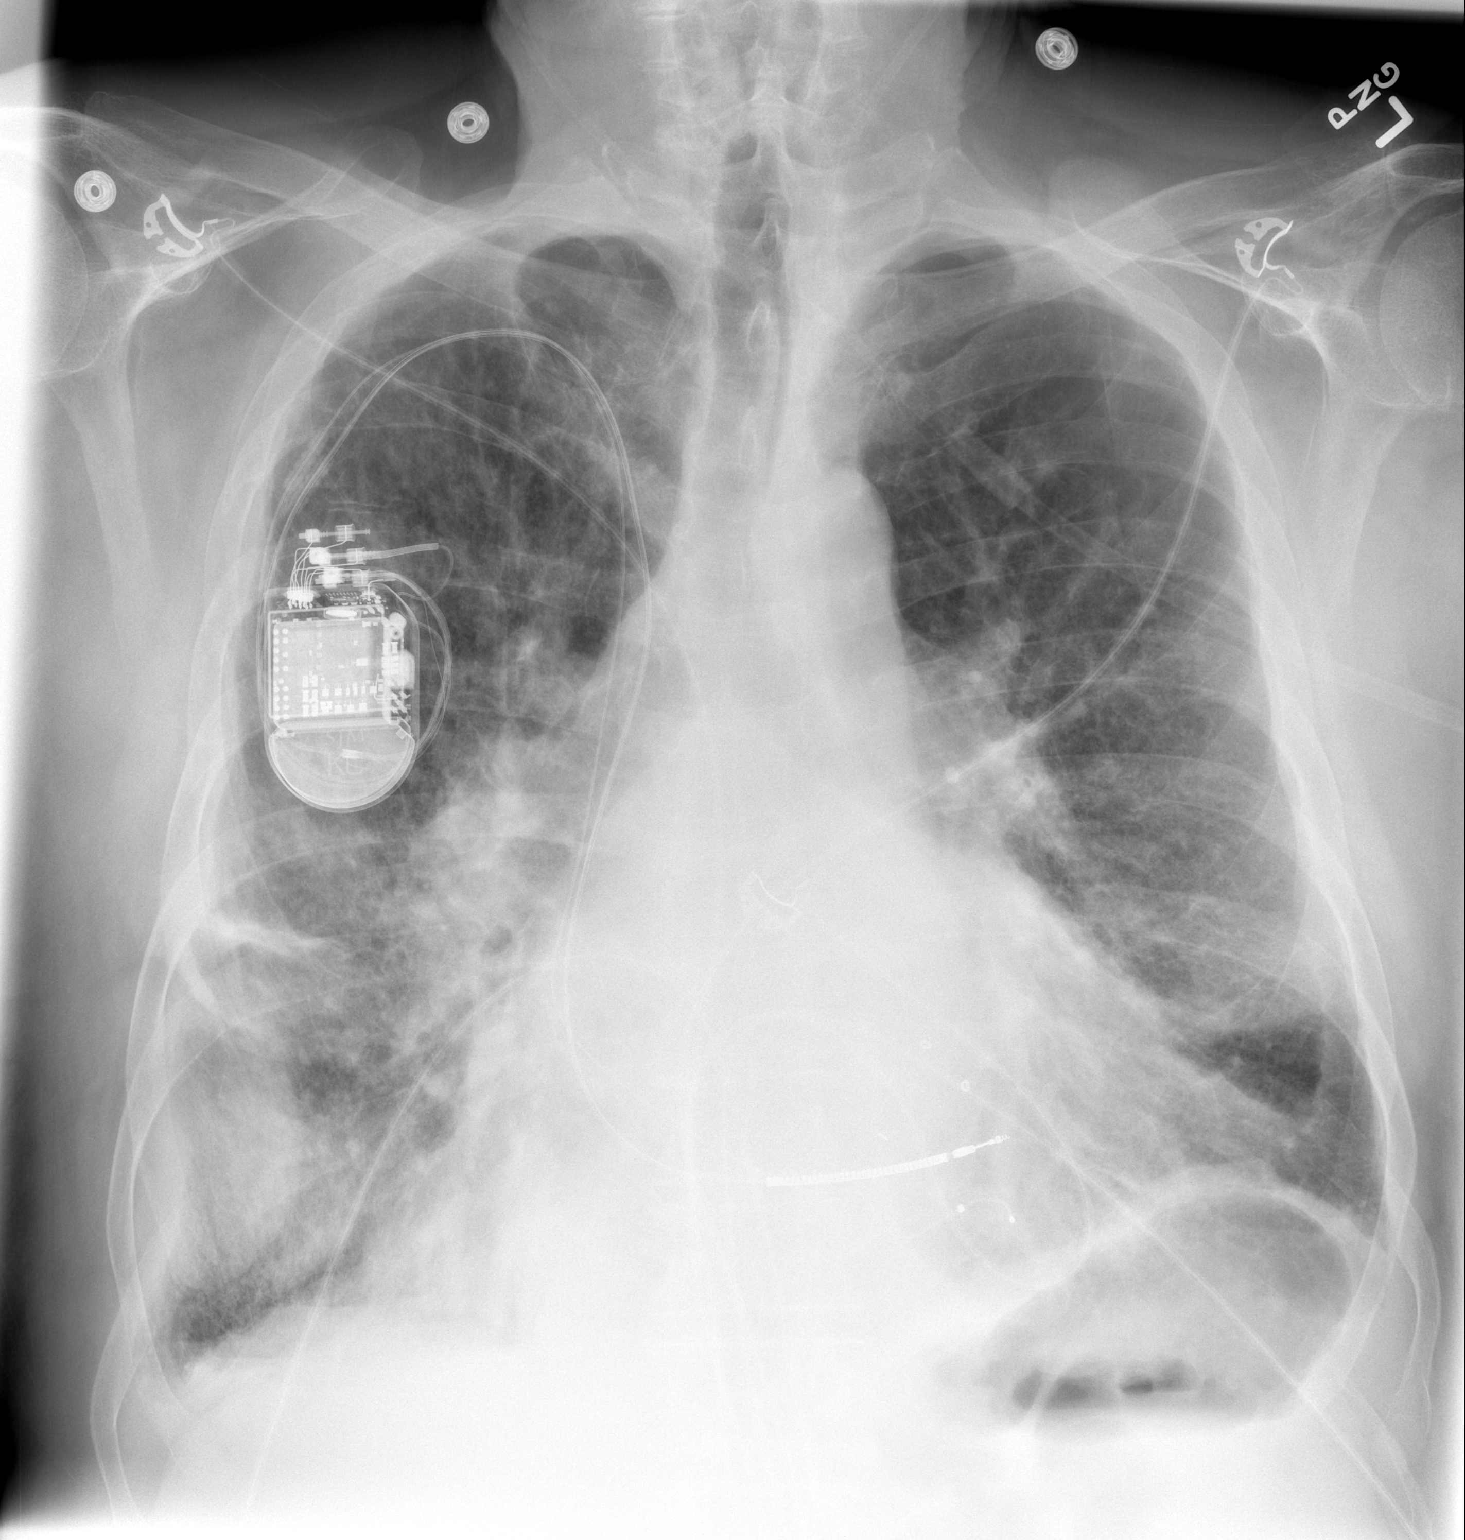

[w chest lat]
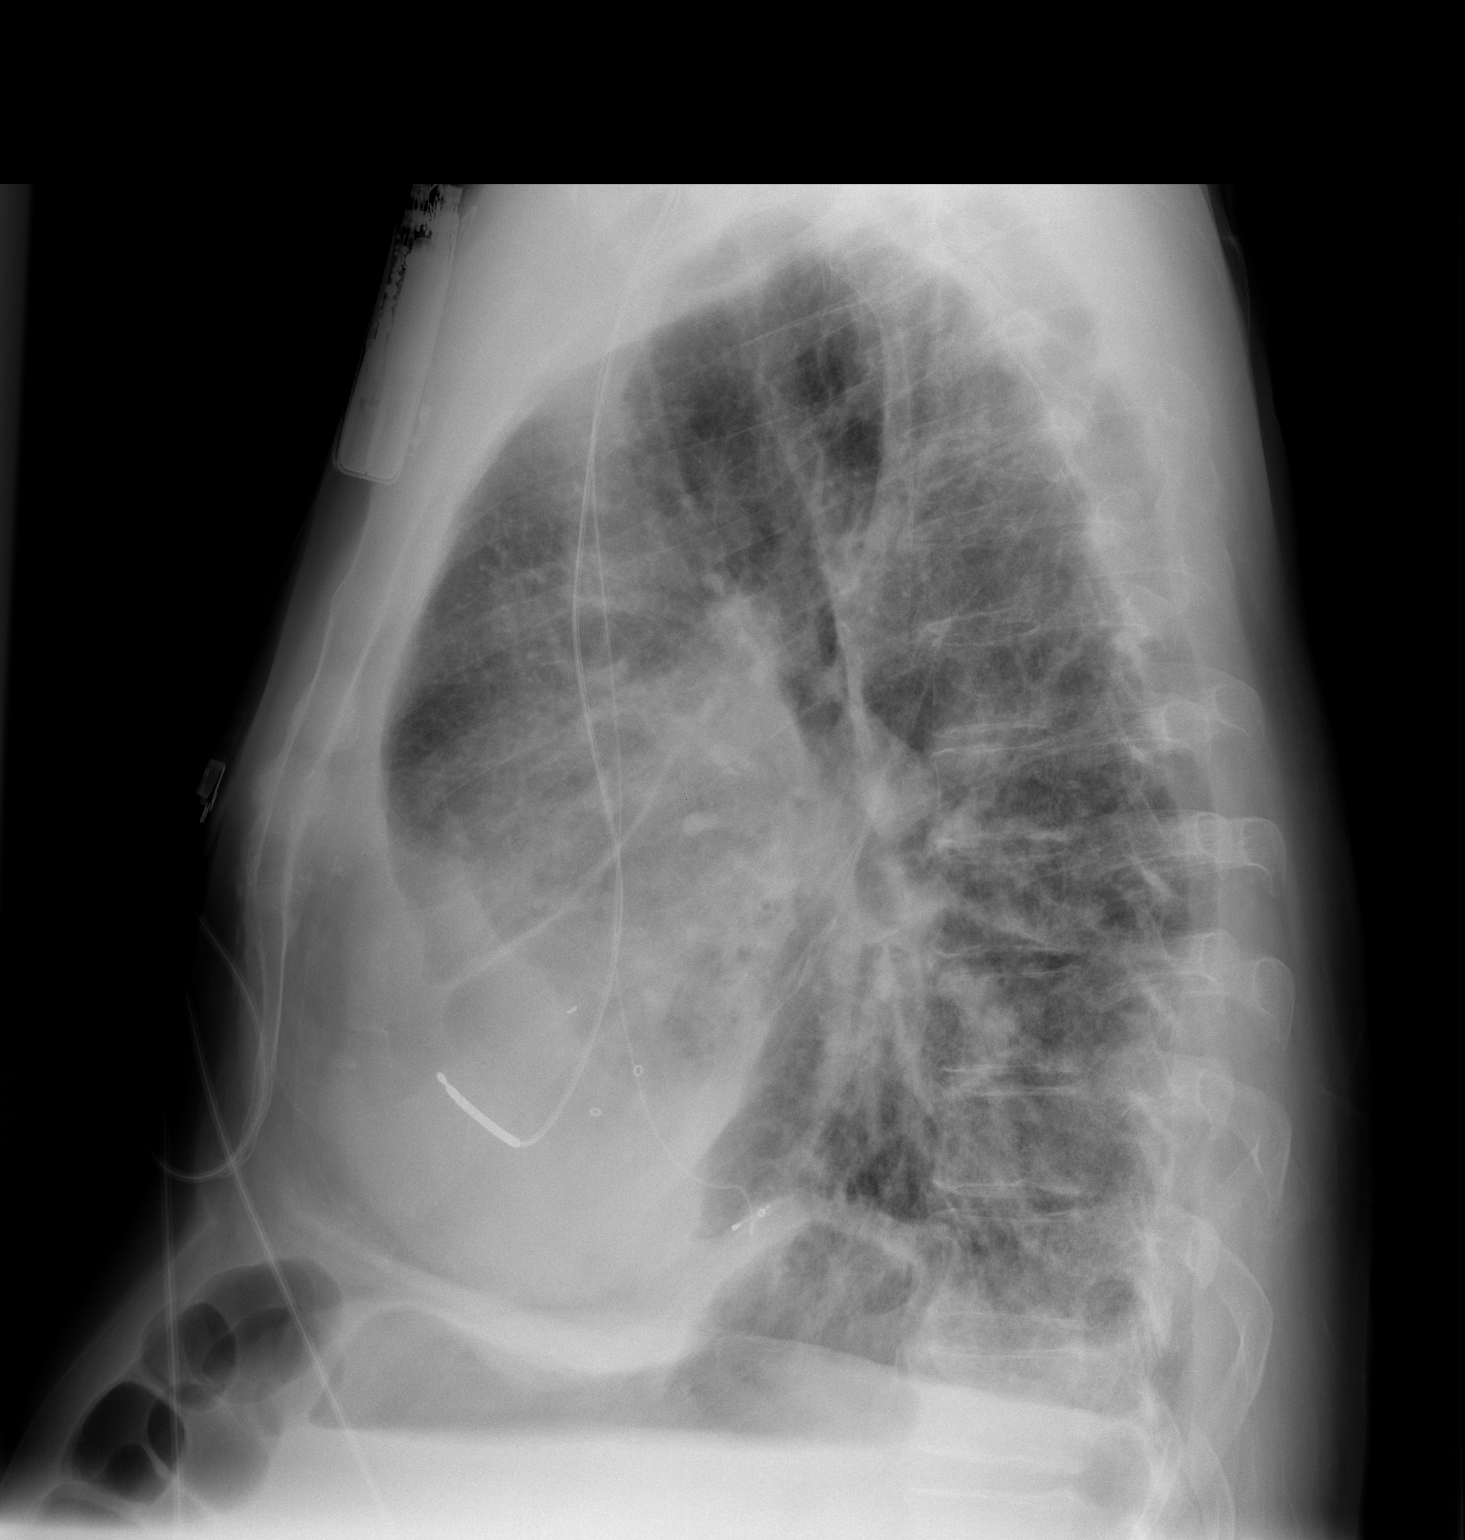

[2 of 2 positions shown; findings below may reference images not displayed]

FINDINGS: There is cardiomegaly. There is extensive right middle and lower
lobe airspace disease. Milder degree of airspace opacity is seen in
the lingula. No pneumothorax identified. The chest is hyperexpanded.
Pacing device in place.
IMPRESSION: Right much worse than left airspace disease likely reflects
pneumonia rather than asymmetric edema. Followup films to clearing
recommended.

Cardiomegaly.

Pulmonary hyperexpansion compatible with emphysema.

## 2015-03-14 IMAGING — CT CT CHEST W/ CM
2 of 3 series · 14 of 36 positions shown, 17 images · IV contrast (APPLIED)
Comparison: DG CHEST 2 VIEW dated 08/12/2013;

CLINICAL DATA: Progressive shortness of breath. History of coronary
artery disease, hypertension, CVA, COPD, atrial fibrillation and
pulmonary hypertension.

EXAM:
CT CHEST WITH CONTRAST
TECHNIQUE: Multidetector CT imaging of the chest was performed during
intravenous contrast administration.
CONTRAST:  75mL OMNIPAQUE IOHEXOL 300 MG/ML  SOLN

[Series 2: thorax 5.0 i31f 1 · axial · 0.85mm/px · z∈[-224,+60]mm · 11 of 69 slices shown, 14 images]
[im 6/69  mediastinal]
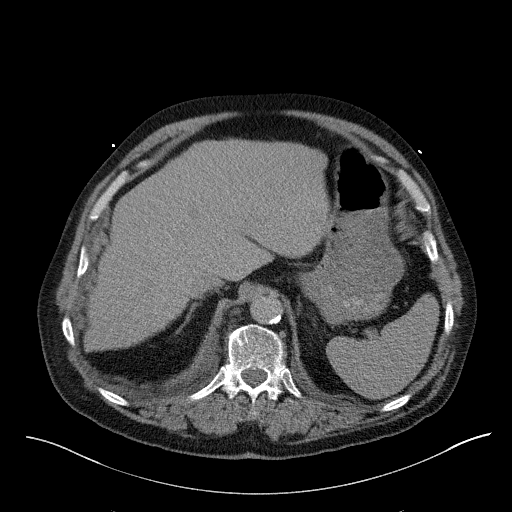
[im 6/69  lung]
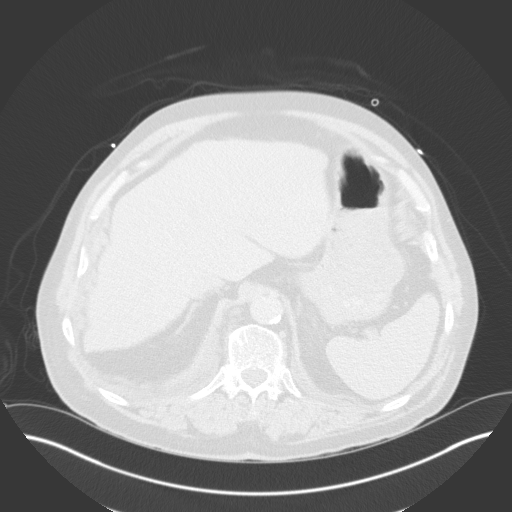
[im 11/69  lung]
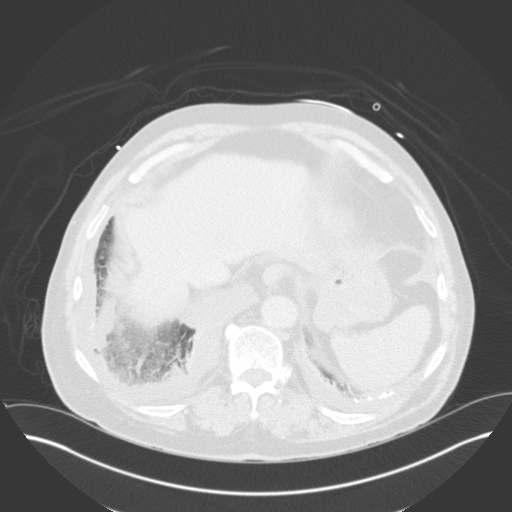
[im 16/69  lung]
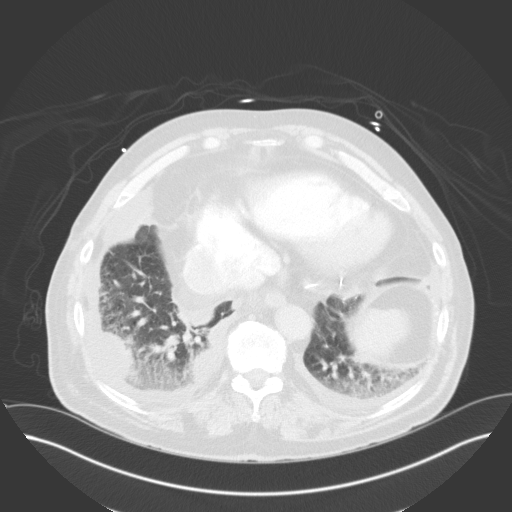
[im 23/69  lung]
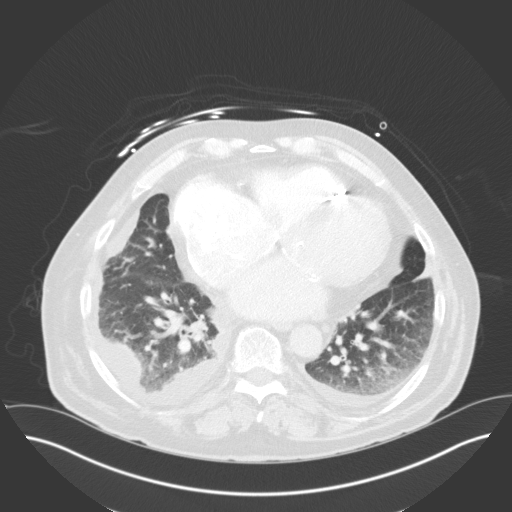
[im 28/69  mediastinal]
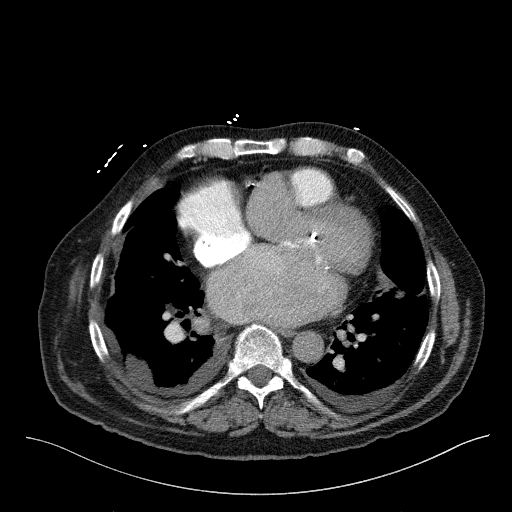
[im 28/69  lung]
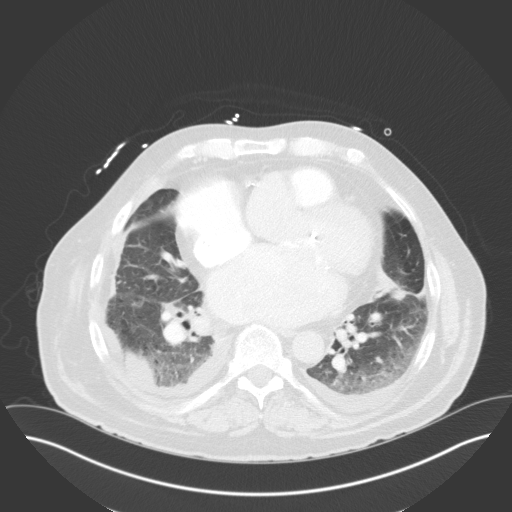
[im 36/69  lung]
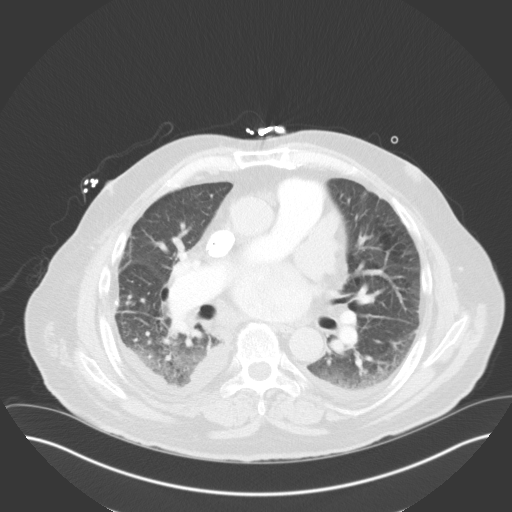
[im 41/69  lung]
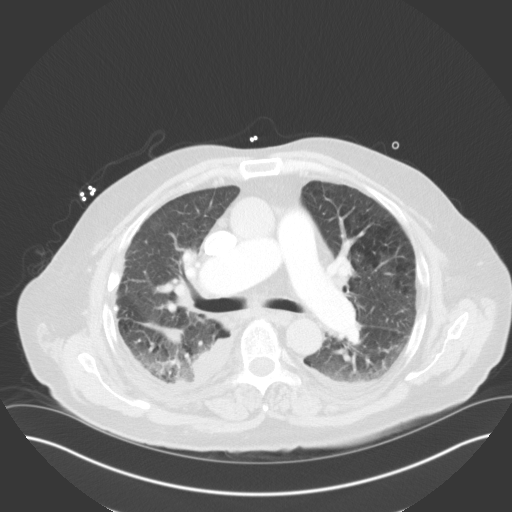
[im 46/69  lung]
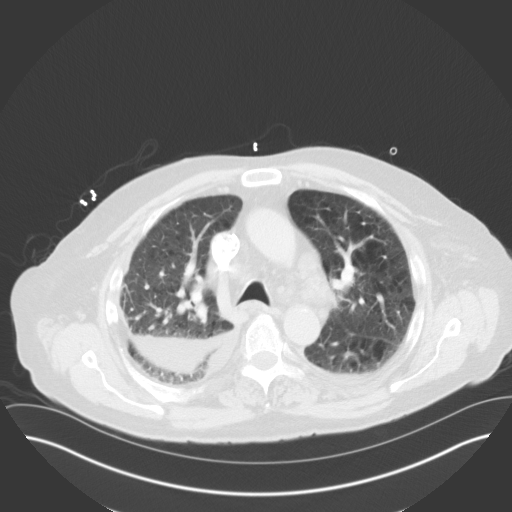
[im 53/69  mediastinal]
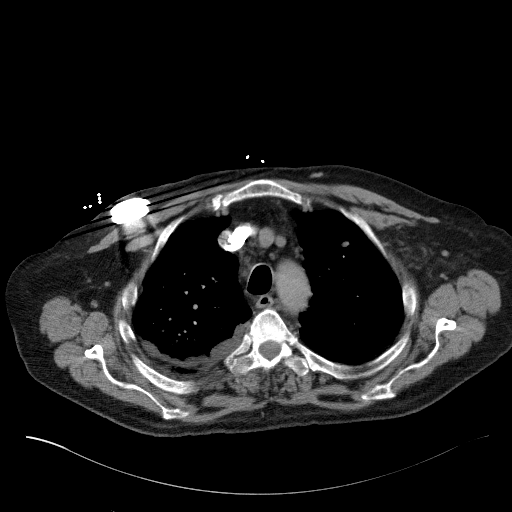
[im 53/69  lung]
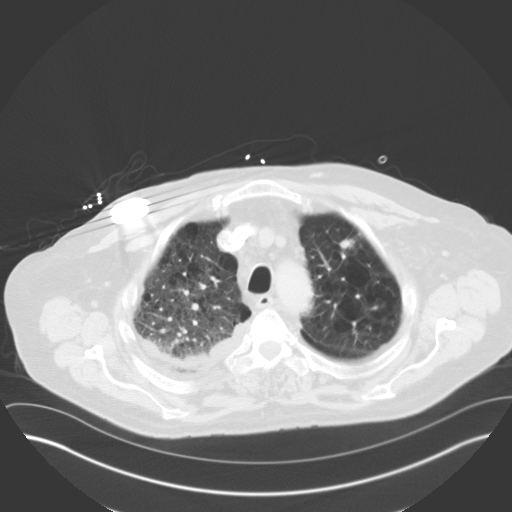
[im 58/69  lung]
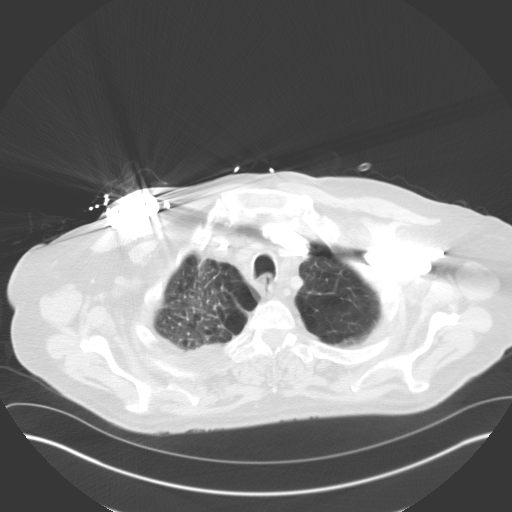
[im 63/69  lung]
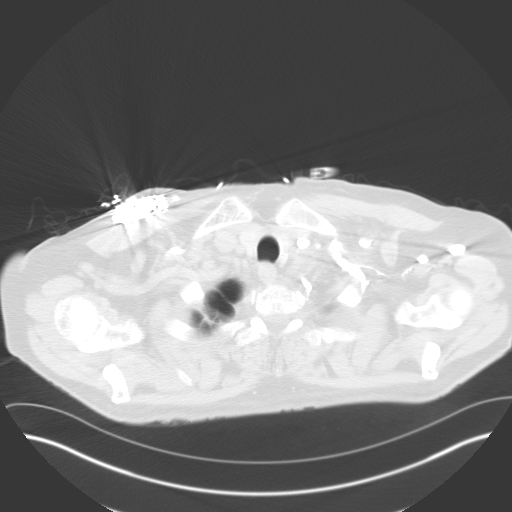

[Series 5: coronal · coronal · 0.67mm/px · 3 of 98 slices shown]
[im 20/98  lung]
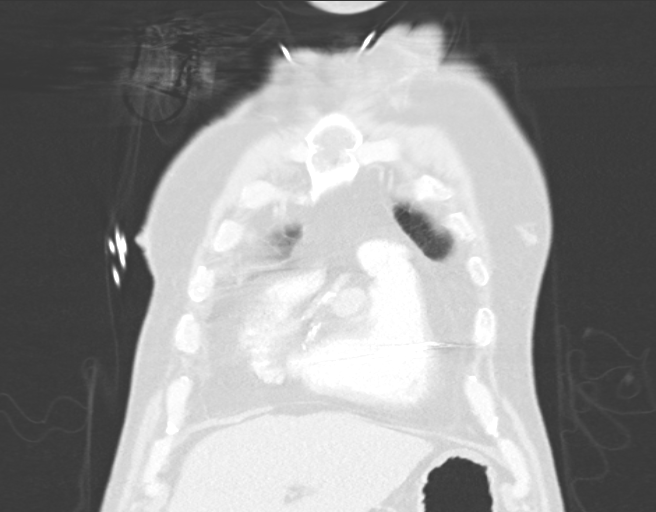
[im 39/98  lung]
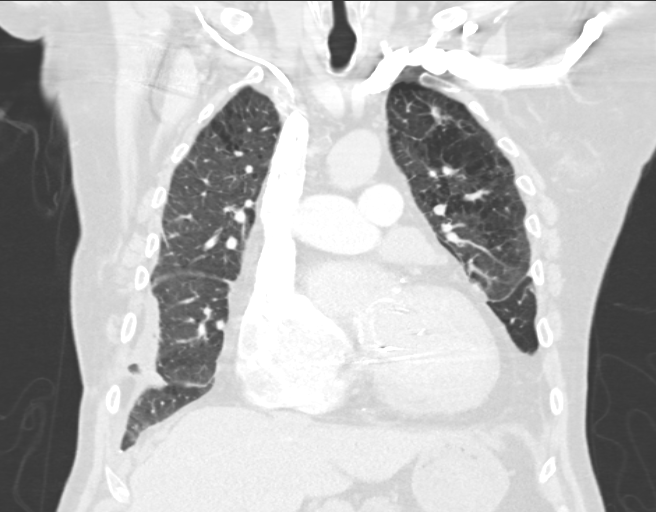
[im 59/98  lung]
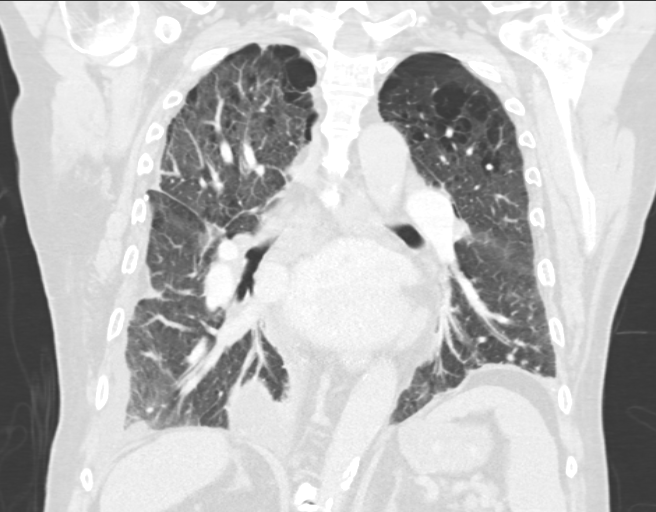

[14 of 36 positions shown; findings below may reference images not displayed]

DG CHEST 2 VIEW dated
02/06/2012; CT ABD W/CM dated 06/07/2008

No prior chest CTs available.
FINDINGS: Right subclavian pacemaker leads extend into the right ventricle and
coronary sinus. There is moderate cardiomegaly with asymmetric
enlargement of the right cardiac chambers and the left atrium. There
is central enlargement of the pulmonary arteries. No acute pulmonary
emboli are demonstrated. There is relatively mild atherosclerosis of
the aorta, great vessels and coronary arteries.

There is a partially loculated pleural effusion on the right which
extends into the fissures. There is a small dependent pleural
effusion on the left. There are pleural calcifications bilaterally
without demonstrated pleural-based mass. There is no significant
pericardial effusion.

Numerous small mediastinal and hilar lymph nodes are not
pathologically enlarged. There is no axillary adenopathy or chest
wall abnormality. Asymmetric gynecomastia is noted on the left.

Advanced emphysematous changes are present in both lungs with
biapical bulla. There is atelectasis at both lung bases adjacent to
the pleural effusions. In the left upper lobe, there is a somewhat
spiculated nodule measuring up to 1.5 cm transverse on image 16. No
other focal nodules are identified. There is no endobronchial
lesion.

The visualized upper abdomen appears stable without suspicious
findings. There are no worrisome osseous findings.
IMPRESSION: 1. Spiculated left upper lobe nodule is morphologically concerning
for possible bronchogenic carcinoma. Given the patient's multiple
other medical problems, consider chest CT follow-up in 4- 6 months
to assess stability. PET-CT could be performed if clinically
warranted.
2. Cardiomegaly with central enlargement of the pulmonary arteries
consistent with pulmonary arterial hypertension.
3. Bilateral pleural effusions, partially loculated on the right.
There is mild adjacent atelectasis, but no focal airspace disease.
There underlying pleural calcifications bilaterally, but no evidence
of pleural-based mass.
4. Emphysema and atherosclerosis noted.

## 2015-03-16 IMAGING — CR DG CHEST 2V
2 series · 2 of 2 positions shown · non-contrast
Comparison: CT, 08/13/2013 and chest radiograph, 08/12/2013.

CLINICAL DATA: Pneumonia versus CHF.

EXAM:
CHEST  2 VIEW

[w chest pa]
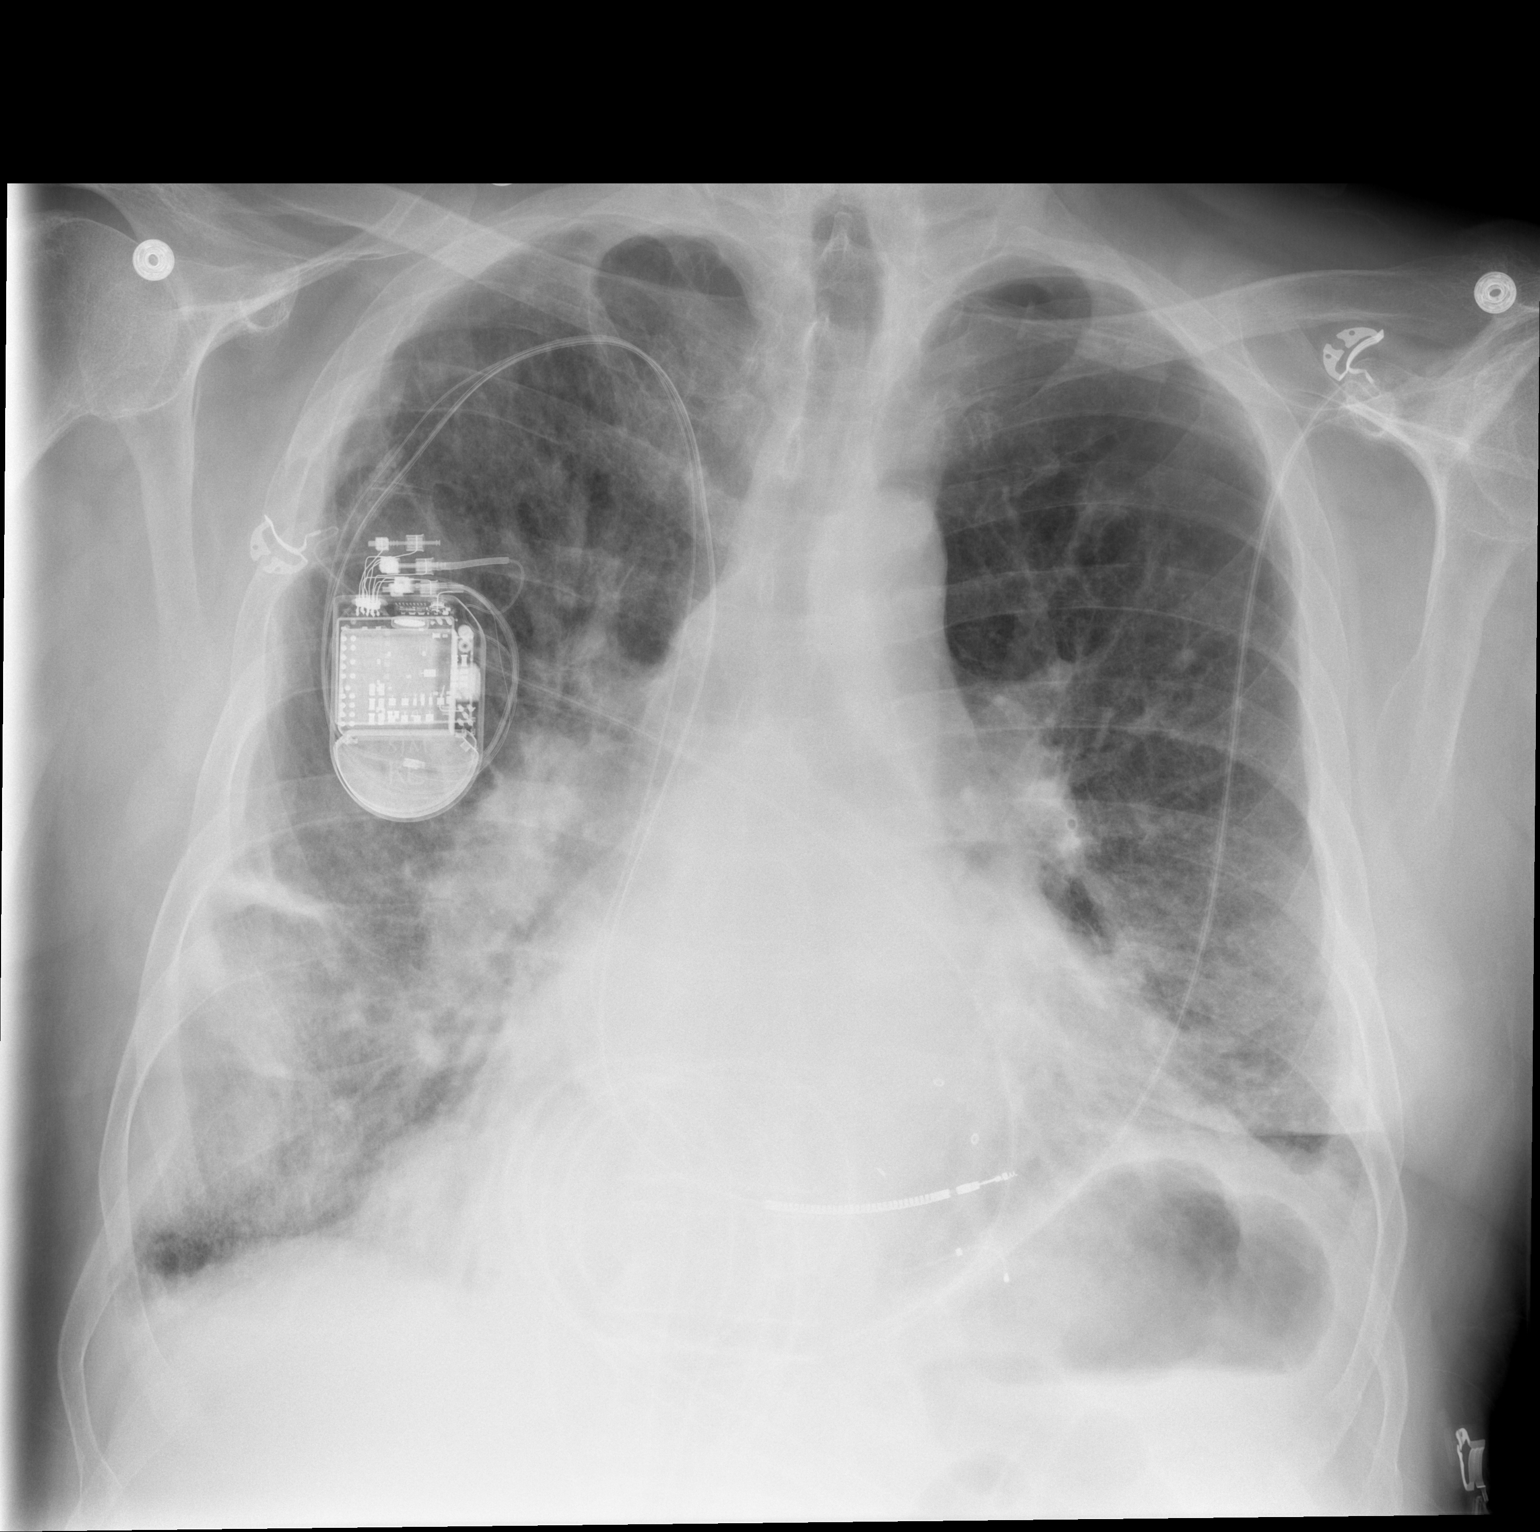

[w chest lat]
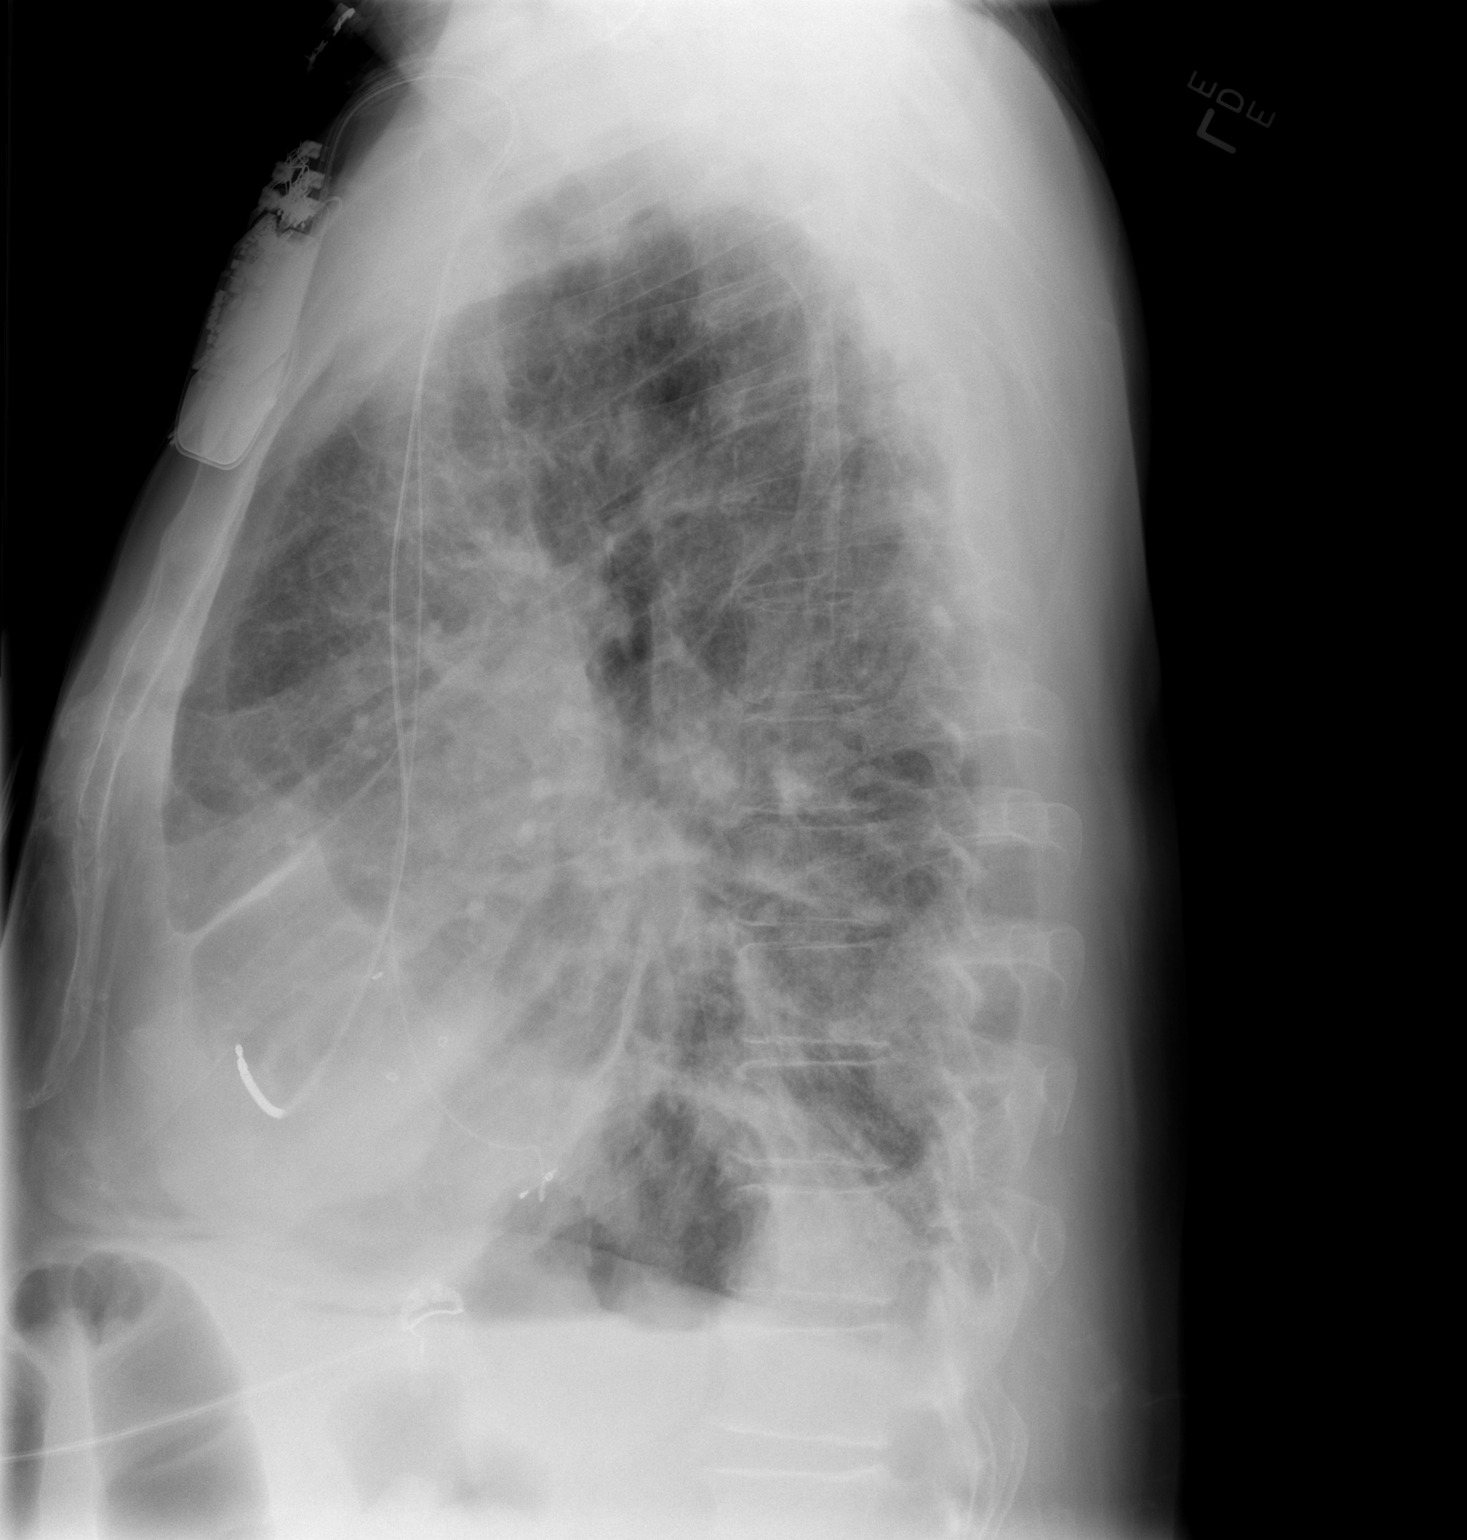

[2 of 2 positions shown; findings below may reference images not displayed]

FINDINGS: Small bilateral pleural effusions, loculated on the right, are
stable. There is interstitial thickening and fell areas of coarse
reticular opacity in the lungs. Hazy airspace type opacity this also
noted in the lower lungs. Stable focal opacity in the left upper
lobe reflects the spiculated nodule noted on the current CT.

Cardiac silhouette is enlarged. Mediastinum is normal in contour.
There is enlargement of both pulmonary arteries.

Right anterior chest wall sequential pacemaker is stable in well
positioned.
IMPRESSION: 1. No change from the previous chest radiograph.
2. Interstitial thickening may be chronic. A component of
interstitial edema is possible. No convincing pneumonia.
3. Left upper lobe mass as detailed on the previous CT.
4. Small pleural effusions, loculated on the right.

## 2015-03-17 IMAGING — CR DG CHEST 2V
2 series · 2 of 2 positions shown · non-contrast
Comparison: 08/15/2013

CLINICAL DATA: Follow-up pleural effusion.  Weakness.  Edema.

EXAM:
CHEST  2 VIEW

[w chest pa]
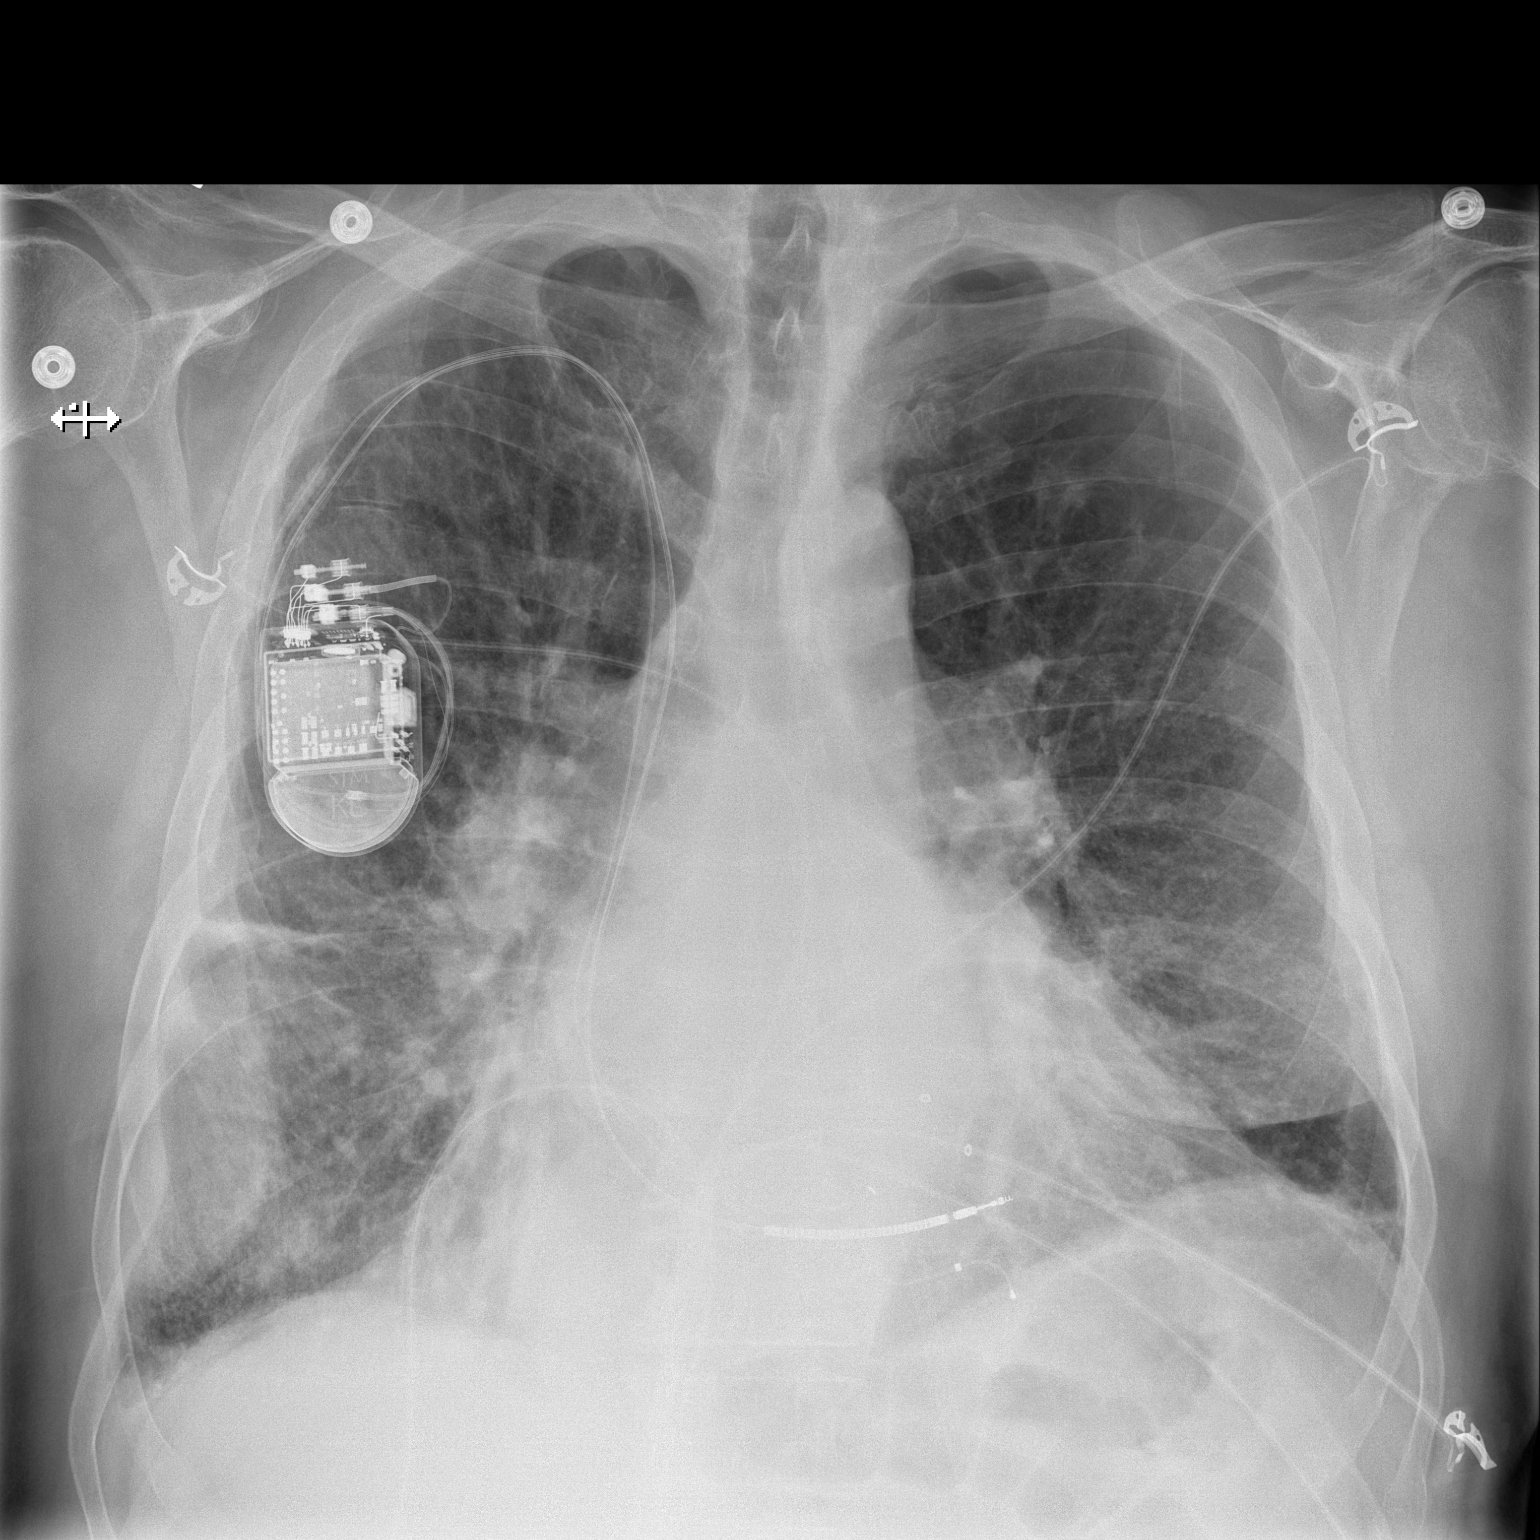

[w chest lat]
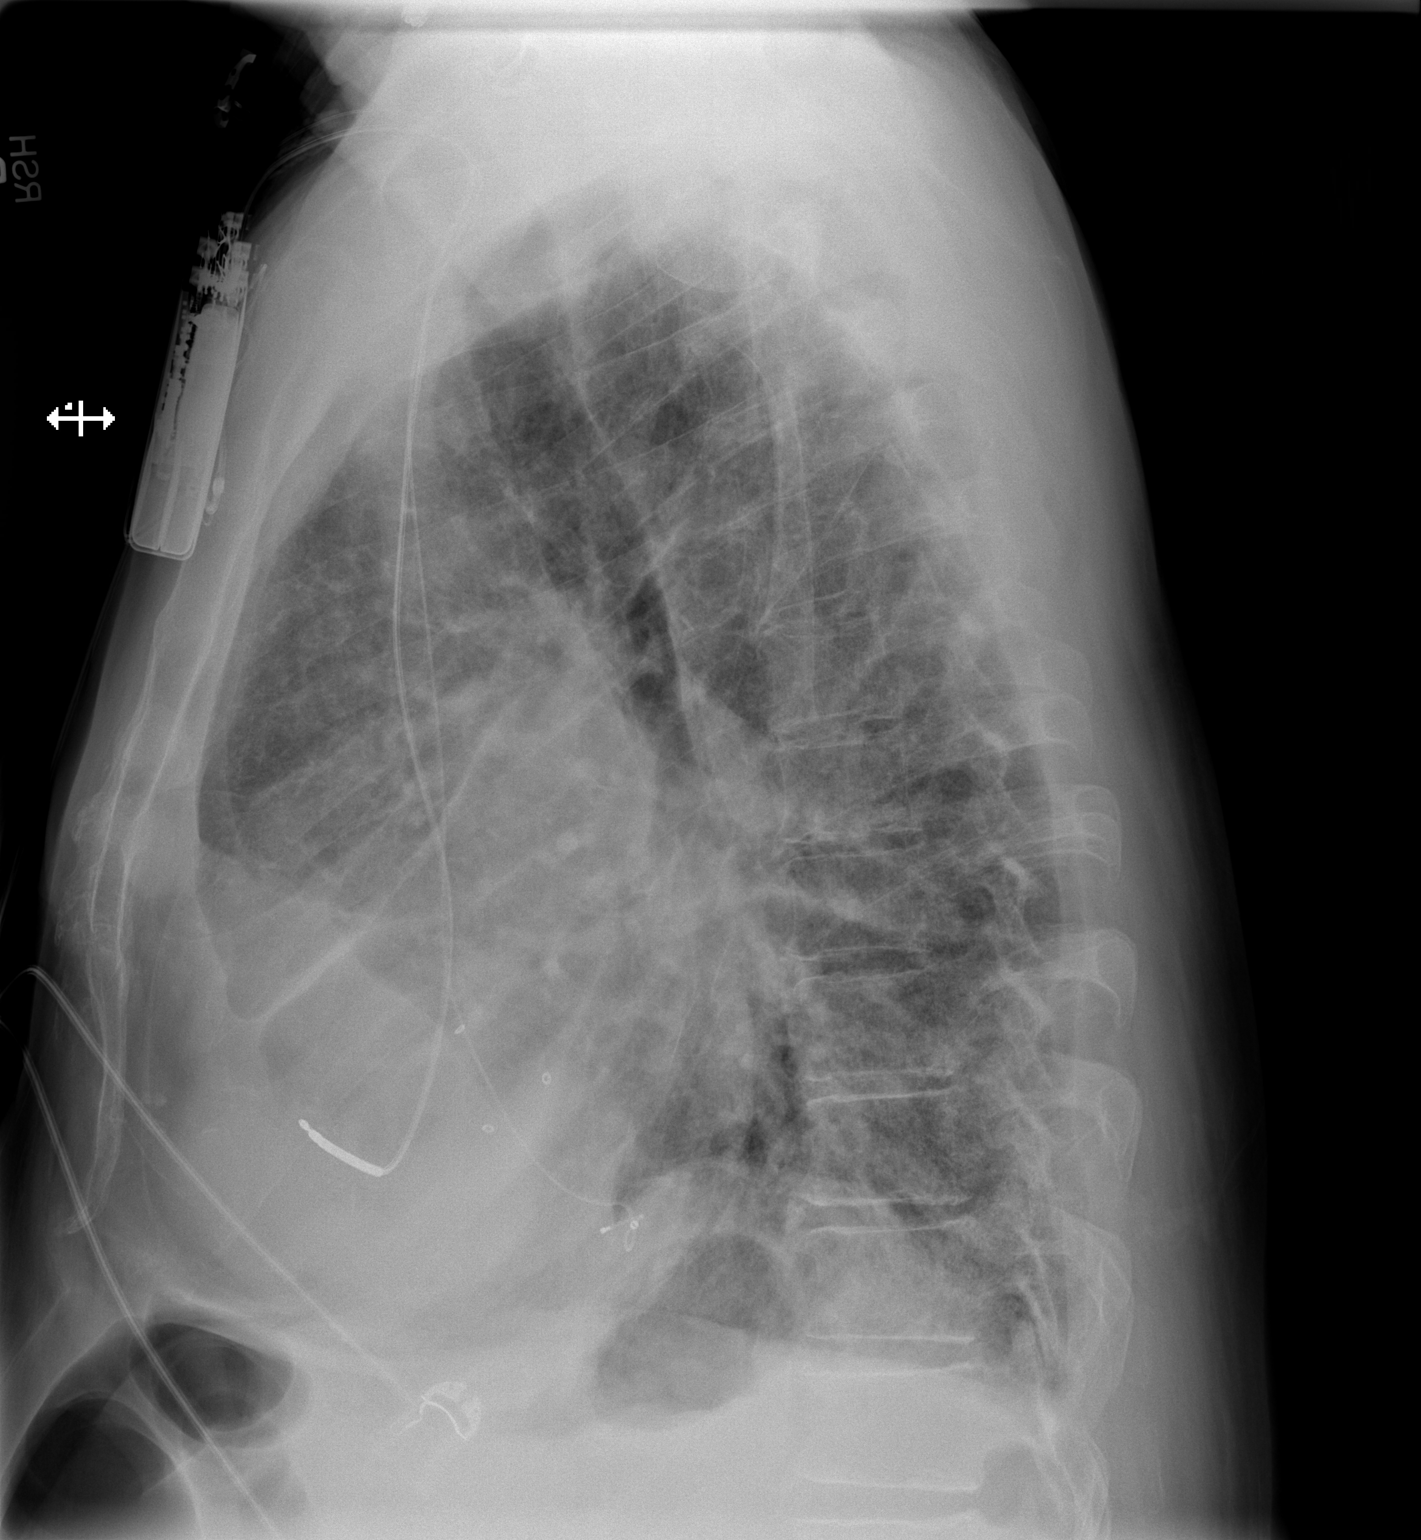

[2 of 2 positions shown; findings below may reference images not displayed]

FINDINGS: Right subclavian dual lead pacemaker is unchanged. Enlargement of
the cardiac silhouette is unchanged. Partially loculated right
pleural effusion does not appear significantly changed. There may be
trace left pleural fluid. Pulmonary vascular congestion has
decreased. Coarse interstitial opacities remain bilaterally. Hazy
airspace opacities in the right lower lung have improved. Spiculated
left upper lobe nodule is unchanged. No acute osseous abnormality is
seen.
IMPRESSION: 1. Mildly improved vascular congestion and right lower lobe airspace
opacity, suggestive of improved edema.
2. Unchanged, loculated right pleural effusion. Possible trace
residual left pleural effusion.

## 2015-03-19 ENCOUNTER — Other Ambulatory Visit: Payer: Self-pay | Admitting: *Deleted

## 2015-03-19 NOTE — Patient Outreach (Signed)
[facility]) Care Management  03/19/2015  [name] 1946/06/27 [id number]   Assessment: Inbound call Patient is High Risk List Referral Receive an incoming call from a lady who identified herself as patient's wife [name]). She asked what company I work with. Care management coordinator reintroduced self to her, and to [facility] care management. Explained to her the purpose of the call. She then explained that patient will not do anything with the program "as what he did in the past 5 years". Wife reports that nobody can persuade patient to get involved with anything. She states, "patient's doctor tried", "daughter tried", wife tried and "even home health nurse tried but unable to convince him". "It's like beating your head on the brick wall" as stated by wife. She further states, "you are just wasting your time since he will refuse every single thing". "All he wants is to sit in the living room and watch TV" per wife's report.  Care management coordinator thanked wife for taking time to call back. Encouraged to call [facility] care management if  needed services in the future.  Plan: Will close case. Will notify primary care provider for case closure.  [name] A. Lidiya Reise, BSN, RN-BC [place] Management Coordinator Cell: [phone]

## 2015-03-19 NOTE — Patient Outreach (Signed)
[facility]) Care Management  03/19/2015  [name] May 16, 1946 [id number]   Assessment: Telephone Assessment (Screening) Patient is a High Risk List Referral. Call placed to patient and was able to speak to him very briefly. Care management coordinator introduced self and purpose of the call. Patient identified himself and states "I don't need anything" and hang up the phone. Dialed back patient's phone number and spoke to a lady who identified self as wife and stated, " I am cooking breakfast right now and I would appreciate it if you quit calling here".  Plan: Will close case due to refusal to engage with [facility] services. Will notify primary care physician of case closure.  [name] [name], BSN, RN-BC [place] Management Coordinator Cell: [phone]

## 2015-03-19 NOTE — Patient Outreach (Signed)
[facility]) Care Management  03/19/2015  PRIMO INNIS 04-24-1946 [id number]   Notification from Dannielle Huh, RN to close case due to patient refused [place] Management services.  Thanks, Ronnell Freshwater. [place], [place] Assistant Phone: [phone] Fax: [phone]

## 2015-03-26 ENCOUNTER — Other Ambulatory Visit: Payer: Self-pay | Admitting: Family Medicine

## 2015-04-07 ENCOUNTER — Telehealth: Payer: Self-pay | Admitting: Pulmonary Disease

## 2015-04-07 NOTE — Telephone Encounter (Signed)
Per 12/22/14 OV w/ TP: Patient Instructions       Continue on current regimen .   Follow up Dr. Elsworth Soho  In 4 months and As needed    --  Dr. Elsworth Soho, do you want pt to be brought back in to see TP since no available appts? thanks

## 2015-04-07 NOTE — Telephone Encounter (Signed)
ok 

## 2015-04-07 NOTE — Telephone Encounter (Signed)
Called spoke with pt. appt scheduled to see TP on 06/19/15. Nothing further needed

## 2015-04-19 ENCOUNTER — Other Ambulatory Visit: Payer: Self-pay | Admitting: Family Medicine

## 2015-04-20 ENCOUNTER — Encounter: Payer: Self-pay | Admitting: Internal Medicine

## 2015-04-27 ENCOUNTER — Other Ambulatory Visit (INDEPENDENT_AMBULATORY_CARE_PROVIDER_SITE_OTHER): Payer: Medicare Other

## 2015-04-27 DIAGNOSIS — E119 Type 2 diabetes mellitus without complications: Secondary | ICD-10-CM | POA: Diagnosis not present

## 2015-04-27 LAB — BASIC METABOLIC PANEL
BUN: 20 mg/dL (ref 6–23)
CALCIUM: 9.6 mg/dL (ref 8.4–10.5)
CO2: 31 meq/L (ref 19–32)
CREATININE: 0.95 mg/dL (ref 0.40–1.50)
Chloride: 101 mEq/L (ref 96–112)
GFR: 83.82 mL/min (ref >60.00)
Glucose, Bld: 179 mg/dL — ABNORMAL HIGH (ref 70–99)
Potassium: 5.1 mEq/L (ref 3.5–5.1)
Sodium: 143 mEq/L (ref 135–145)

## 2015-04-27 LAB — HEMOGLOBIN A1C: HEMOGLOBIN A1C: 7 % — AB (ref 4.6–6.5)

## 2015-05-01 ENCOUNTER — Ambulatory Visit (INDEPENDENT_AMBULATORY_CARE_PROVIDER_SITE_OTHER): Payer: Medicare Other | Admitting: Adult Health

## 2015-05-01 ENCOUNTER — Encounter: Payer: Self-pay | Admitting: Adult Health

## 2015-05-01 VITALS — BP 120/68 | HR 74 | Temp 98.1°F | Ht 73.0 in | Wt 212.4 lb

## 2015-05-01 DIAGNOSIS — E119 Type 2 diabetes mellitus without complications: Secondary | ICD-10-CM | POA: Diagnosis not present

## 2015-05-01 DIAGNOSIS — Z23 Encounter for immunization: Secondary | ICD-10-CM

## 2015-05-01 NOTE — Patient Instructions (Signed)
It was great seeing you again!  Your A1c is right where we want it to be, but your blood sugars are still elevated. Continue to monitor at home and watch your diet.   Follow up with me in April for your physical.   If you need anything in the meantime, please let me know.

## 2015-05-01 NOTE — Progress Notes (Signed)
Subjective:    Patient ID: [name], male    DOB: 10-01-1945, 69 y.o.   MRN: [id number]  HPI  69 year old male who presents to the office today for three months follow up visit regarding his diabetes. His A1c was 7.5 in July, currently it is 7.0. He endorses feeling good, states " I have felt great over the last week and a half."  He has no specific complaints except for the baseline shortness of breath and he is currently on home oxygen. He denied having any significant weight loss or night sweats. The patient denied having any nausea, vomiting, abdominal pain, diarrhea or constipation.   He checks his blood sugars on M/W/F and reports that his sugars have been ranging in the 120's-130's. He did not bring his monitor with him.    Review of Systems  Constitutional: Negative.   Respiratory: Positive for shortness of breath. Negative for apnea, cough, choking, chest tightness and wheezing.   Cardiovascular: Negative.   Gastrointestinal: Negative.   Genitourinary: Negative.   Musculoskeletal: Negative.   Psychiatric/Behavioral: Negative.   All other systems reviewed and are negative.  Past Medical History  Diagnosis Date  . Hypertension   . Endocarditis     Bacterial, 2009  . Colon polyps   . Dyslipidemia   . Atrial fibrillation ([place])     AV Node ablation January, 2010, for rapid atrial fib  . Cardiomyopathy     non-ischemic  . Ejection fraction < 50%   . Atrial septal defect     Closed with surgery January, 2010  . Mitral regurgitation     Severe symptomatic primary MR due to bacterial endocarditis, treated w/ MVR  . Intracranial hemorrhage (HCC)     Coumadin cannot be used because of the history of his bleed  . Renal artery stenosis (HCC)     Mild by history  . Pulmonary hypertension (HCC)     Moderate  . HLD (hyperlipidemia)   . Automatic implantable cardioverter-defibrillator in situ     LV dysfunction and pacer needed for AV node lesion  . CVA (cerebral  vascular accident) ([place]) 2009    denies residual on 08/14/2013  . CHF (congestive heart failure) ([place])   . Myocardial infarction ([place]) 2010  . Dysrhythmia   . Pneumonia     "this is my first case" (08/14/2013)  . Spontaneous pneumothorax     right thoracotomy - distant past  . Permanent atrial fibrillation     Originally Coumadin use for atrial fibrillation  //   he had intracerebral hemorrhage with an INR of 2.3 June, 2009. Anticoagulation could no longer be used.  //  Rapid atrial fibrillation after inferior MI October, 2010..........Marland Kitchen AV node ablation done at that time with ICD pacemaker placed (EF 35%).   //   Left atrial appendage tied off at the time of mitral valve surgery January, 2010 (maze pro  . Status post minimally invasive mitral valve replacement with bioprosthetic valve     33 mm Medtronic Mosaic porcine bioprosthesis placed via right mini thoracotomy for bacterial endocarditis complicated by severe MR and CHF   . Prosthetic valve dysfunction     Mild mitral stenosis  . Chronic combined systolic and diastolic CHF (congestive heart failure) ([place])   . Pacemaker   . Diabetes mellitus without complication ([place])   . Headache(784.0)     related to stroke only  . COPD (chronic obstructive pulmonary disease) (HCC)     O2- 2 liters,  nasal cannula, q night   . COPD GOLD II 01/11/2007    PFT's 09/25/13  FEV1  2.18 (61%) ratio 64 no change p B2 and DLC0  32% corrects to 68% - trial off advair and acei rec starting  08/23/2013     . Lung cancer ([place]) 11/29/2013    T1N0 Stage Ia non-small cell carcinoma left lung treated with wedge resection    Social History   Social History  . Marital Status: Married    Spouse Name: [name]  . Number of Children: 0  . Years of Education: College   Occupational History  . Retired     Tour manager   Social History Main Topics  . Smoking status: Former Smoker -- 1.00 packs/day for 45 years    Types: Cigarettes    Quit date: 08/11/2013  . Smokeless  tobacco: Never Used     Comment: 08/14/2013 "quit smoking in 2009"  . Alcohol Use: Yes     Comment: 08/14/2013 "used to drink beer; quit:in 1982"  . Drug Use: No  . Sexual Activity: Yes   Other Topics Concern  . Not on file   Social History Narrative   Patient lives at home with his spouse.   Caffeine Use: none   Worked for the post office   Has two boys and a girl. All live local.     Past Surgical History  Procedure Laterality Date  . Mitral valve replacement Right 07/17/2008    35m Medtronic Mosaic porcine bioprosthesis  . Cardiac defibrillator placement  ~ 2010    St Jude  . Penile prosthesis implant    . Appendectomy    . Mass biopsy Left     neck mass  . Leep    . Cataract extraction w/ intraocular lens  implant, bilateral Bilateral   . Asd repair, secundum  07/17/2008    pericardial patch closure of ASD  . Av node ablation  07/2008    for rapid atrial fib  . Thoracotomy Right 1970's    spontaneous pneumothorax - while in the [employer] . Tonsillectomy    . Insert / replace / remove pacemaker    . Cardiac valve replacement    . Cardiac catheterization    . Video assisted thoracoscopy (vats)/wedge resection Left 11/29/2013    Procedure: Video assisted thoracoscopy for wedge resection; mini thoracotomy;  Surgeon: CRexene Alberts MD;  [facility]  Service: Thoracic;  Laterality: Left;  . Implantable cardioverter defibrillator (icd) generator change N/A 02/06/2012    Procedure: ICD GENERATOR CHANGE;  Surgeon: GEvans Lance MD;  [facility];  Service: Cardiovascular;  Laterality: N/A;  . Right heart catheterization N/A 08/16/2013    Procedure: RIGHT HEART CATH;  Surgeon: PJosue Hector MD;  [facility];  Service: Cardiovascular;  Laterality: N/A;    Family History  Problem Relation Age of Onset  . Stomach cancer Father   . Cancer Father   . Stroke      family history  . Stroke Mother     Allergies  Allergen Reactions  . Anticoagulant Compound  Other (See Comments)    Pt had intracranial bleed, therefore all anticoagulation is contra-indicated per Dr. KRon Parker  . Warfarin Sodium Other (See Comments)    Pt had intracranial bleed, therefore all anticoagulation is contra-indicated per Dr. KRon Parker     Current Outpatient Prescriptions on File Prior to Visit  Medication Sig Dispense Refill  . albuterol (PROVENTIL HFA;VENTOLIN HFA) 108 (90  BASE) MCG/ACT inhaler Inhale 2 puffs into the lungs every 6 (six) hours as needed for wheezing or shortness of breath. 1 Inhaler 6  . aspirin EC 81 MG tablet Take 1 tablet (81 mg total) by mouth daily. 90 tablet 3  . atorvastatin (LIPITOR) 10 MG tablet Take 1 tablet (10 mg total) by mouth daily. 90 tablet 3  . budesonide-formoterol (SYMBICORT) 160-4.5 MCG/ACT inhaler Inhale 2 puffs into the lungs 2 (two) times daily.    . carvedilol (COREG) 3.125 MG tablet TAKE 1 TABLET BY MOUTH TWICE A DAY WITH MEALS 180 tablet 1  . furosemide (LASIX) 40 MG tablet Take 40 mg by mouth daily. May take 1 extra daily as needed for edema    . irbesartan (AVAPRO) 75 MG tablet TAKE 1 TABLET BY MOUTH EVERY DAY 90 tablet 1  . metFORMIN (GLUCOPHAGE) 1000 MG tablet Take 1 tablet (1,000 mg total) by mouth 2 (two) times daily with a meal. 60 tablet 3  . nitroGLYCERIN (NITROSTAT) 0.4 MG SL tablet Place 1 tablet (0.4 mg total) under the tongue every 5 (five) minutes as needed. For chest pain 25 tablet 1  . NON FORMULARY Wears 2L of O2 continuously    . SYMBICORT 160-4.5 MCG/ACT inhaler INHALE 2 PUFFS INTO THE LUNGS TWICE A DAY 10.2 Inhaler 2   No current facility-administered medications on file prior to visit.    BP 120/68 mmHg  Pulse 74  Temp(Src) 98.1 F (36.7 C) (Oral)  Ht '6\' 1"'$  (1.854 m)  Wt 212 lb 6.4 oz (96.344 kg)  BMI 28.03 kg/m2  SpO2 94%       Objective:   Physical Exam  Constitutional: He is oriented to person, place, and time. He appears well-developed and well-nourished. No distress.  Neck: Normal range of  motion. Neck supple.  Cardiovascular: Normal rate, regular rhythm, normal heart sounds and intact distal pulses.  Exam reveals no gallop and no friction rub.   No murmur heard. Pulmonary/Chest: Effort normal and breath sounds normal. No respiratory distress. He has no wheezes. He has no rales. He exhibits no tenderness.  2 L via [place]  Musculoskeletal: Normal range of motion. He exhibits no edema or tenderness.  Lymphadenopathy:    He has no cervical adenopathy.  Neurological: He is alert and oriented to person, place, and time.  Skin: Skin is warm and dry. No rash noted. He is not diaphoretic. No erythema. No pallor.  Psychiatric: He has a normal mood and affect. His behavior is normal. Judgment and thought content normal.      Assessment & Plan:  1. Type 2 diabetes mellitus without complication, without long-term current use of insulin (HCC) - A1c at goal.  - Follow up in 6 months for recheck at [facility] - Follow up sooner if needed - Continue to monitor blood sugar at home and follow a diabetic diet - Follow up with highs or lows.  - I considered starting Glipizide, but patient did not want to start a new medication at this time.   2. Encounter for immunization - flu shot given

## 2015-05-01 NOTE — Progress Notes (Signed)
Pre visit review using our clinic review tool, if applicable. No additional management support is needed unless otherwise documented below in the visit note. 

## 2015-05-16 ENCOUNTER — Other Ambulatory Visit: Payer: Self-pay | Admitting: Family Medicine

## 2015-05-17 ENCOUNTER — Other Ambulatory Visit: Payer: Self-pay | Admitting: Family Medicine

## 2015-05-30 IMAGING — CT NM PET TUM IMG INITIAL (PI) SKULL BASE T - THIGH
8 series · 23 of 25 positions shown · non-contrast
Comparison: Chest CT 08/13/2013.

CLINICAL DATA: Additional treatment strategy for lung nodule.

EXAM:
NUCLEAR MEDICINE PET SKULL BASE TO THIGH
TECHNIQUE: 10.3 mCi F-18 FDG was injected intravenously. Full-ring PET imaging
was performed from the skull base to thigh after the radiotracer. CT
data was obtained and used for attenuation correction and anatomic
localization.
FASTING BLOOD GLUCOSE:  Value: 161 mg/dl

[Series 3: pet sk_thigh ac · axial · 5.0mm · 4.07mm/px · z∈[-1320,-396]mm · 4 of 232 slices shown]
[im 1/232]
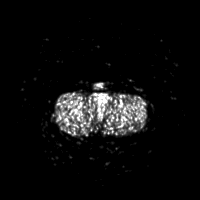
[im 78/232]
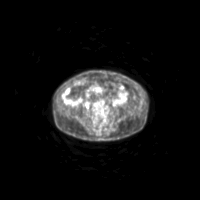
[im 155/232]
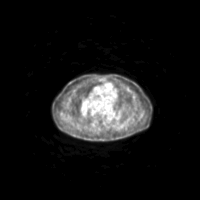
[im 232/232]
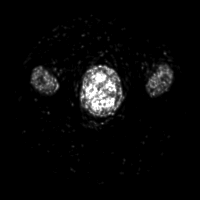

[Series 4: ct sk_thigh 5.0 b31f · axial · 5.0mm · 0.98mm/px · z∈[-1320,-396]mm · 4 of 232 slices shown]
[im 1/232]
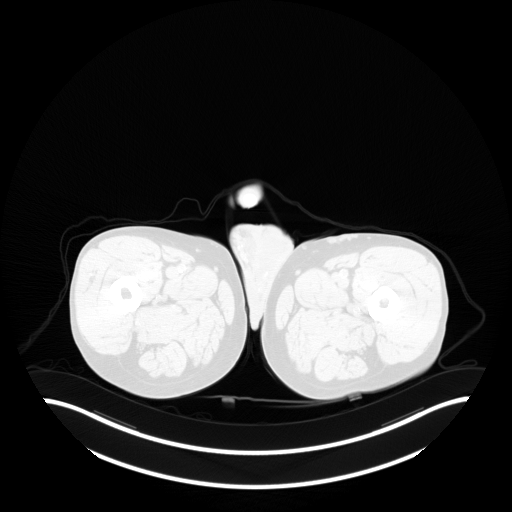
[im 58/232]
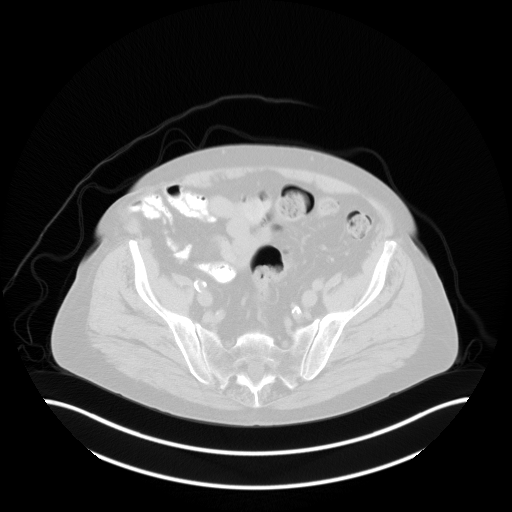
[im 174/232]
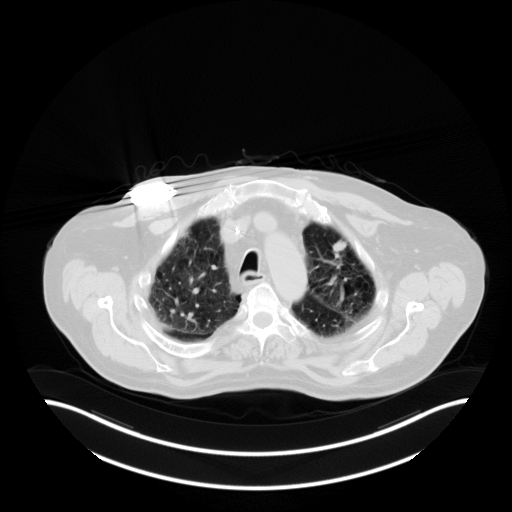
[im 232/232  bone]
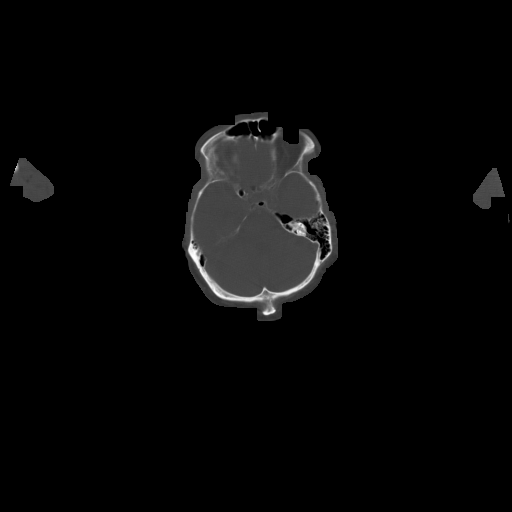

[Series 6: ct sk_thigh 5.0 b70f (id)_bone · axial · 5.0mm · 0.74mm/px · z∈[-840,-552]mm · 2 of 73 slices shown]
[im 1/73  bone]
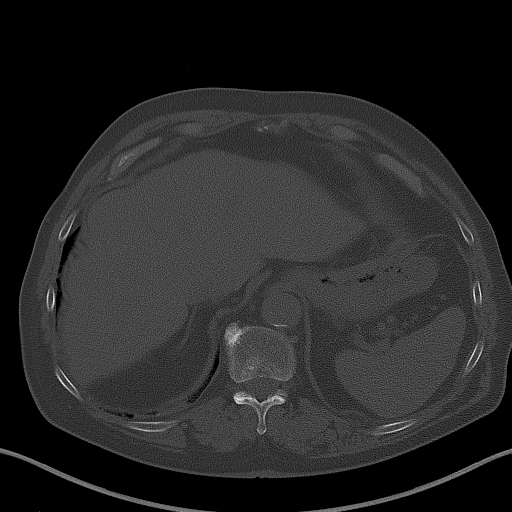
[im 73/73  bone]
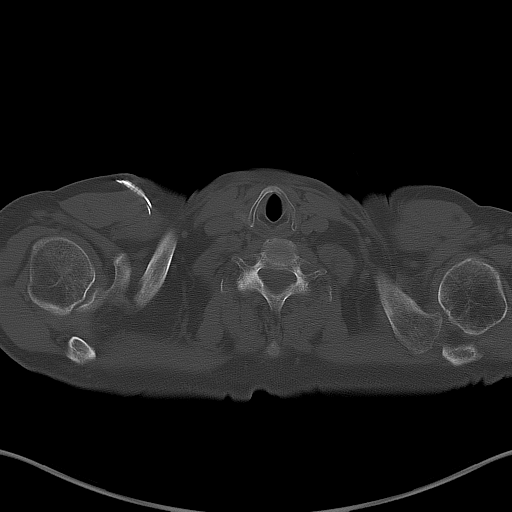

[Series 8: pet sk_thigh nac · axial · 5.0mm · 4.07mm/px · z∈[-1320,-396]mm · 5 of 232 slices shown]
[im 1/232]
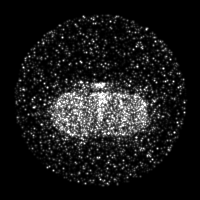
[im 58/232]
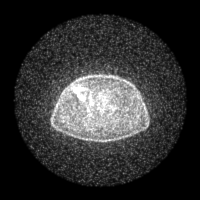
[im 116/232]
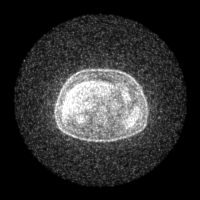
[im 174/232]
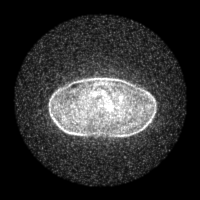
[im 232/232]
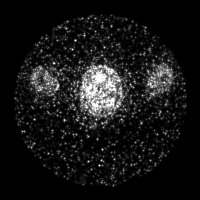

[Series 604: mip collection<mip range> · coronal · 1.92mm/px · 1 of 32 slices shown]
[im 1/32]
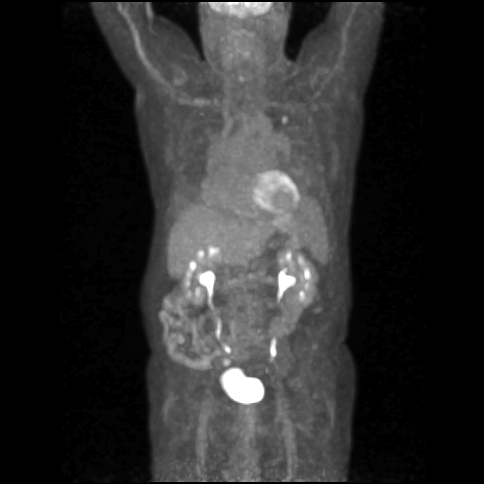

[Series 605: range-ct sk_thigh 5.0 (id)<alpha range> · 1 of 88 slices shown (1 of 2)]
[im 1/88]
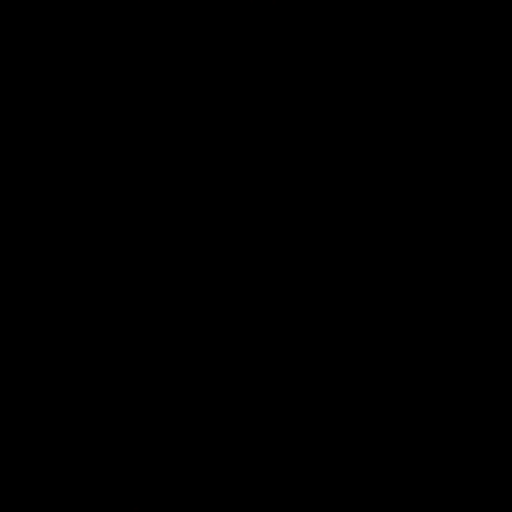

[Series 606: range-ct sk_thigh 5.0 (id)<alpha range> · 5 of 218 slices shown (2 of 2)]
[im 1/218]
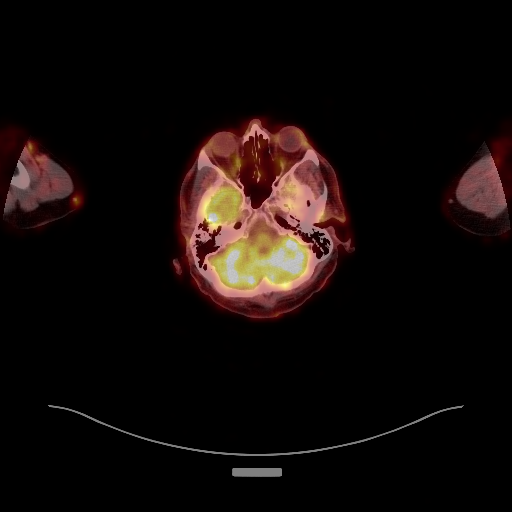
[im 55/218]
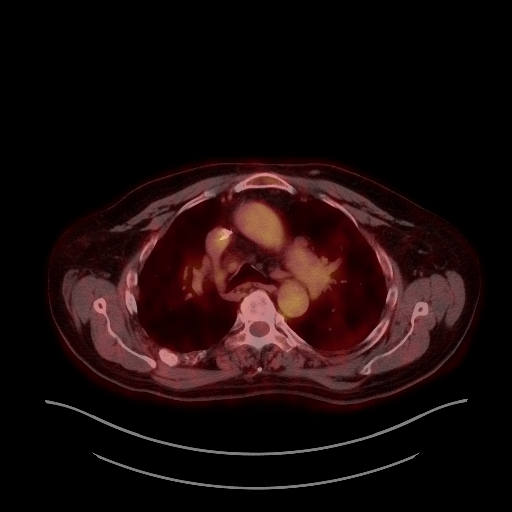
[im 109/218]
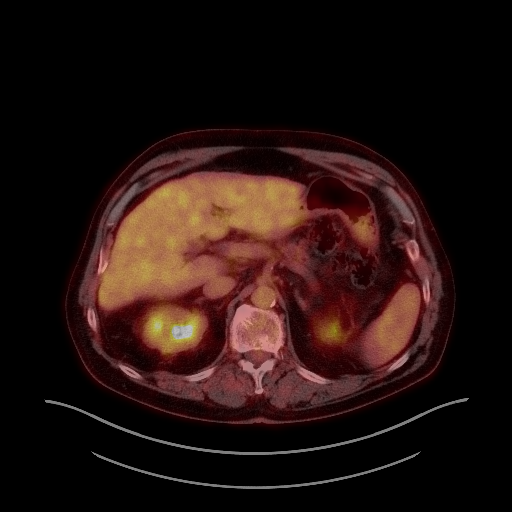
[im 163/218]
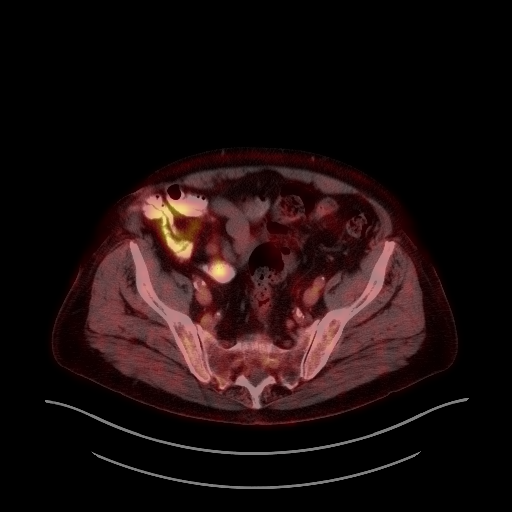
[im 218/218]
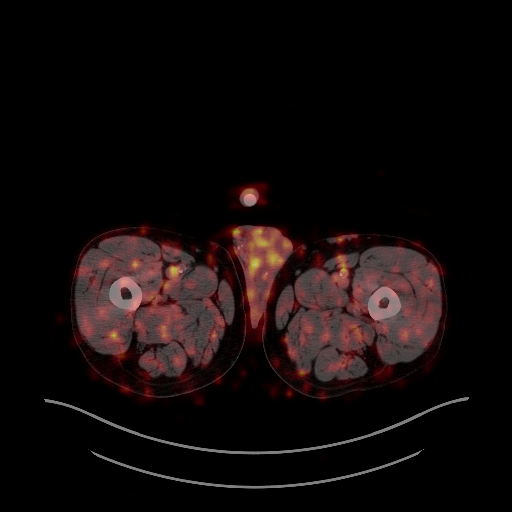

[Series 1032: results mm oncology reading · 0.85mm/px · 1 of 1 slices shown]
[im 1/1]
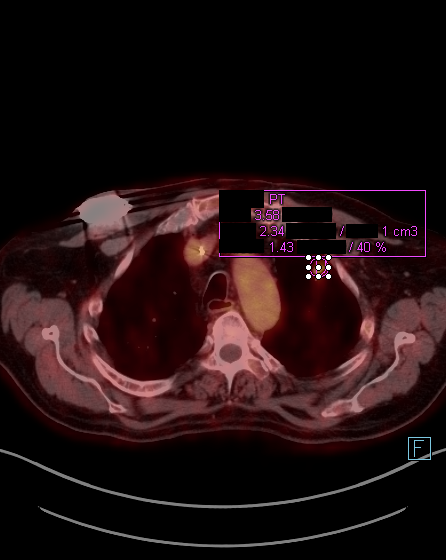

[23 of 25 positions shown; findings below may reference images not displayed]

FINDINGS: NECK

No hypermetabolic lymph nodes in the neck.

CHEST

Image 20 of series 6 demonstrates a 1.6 x 1.1 cm macrolobulated
lesion with slightly spiculated margins in the apex of the left
upper lobe, abutting the pleura, which is mildly hypermetabolic
(SUVmax = 3.6). No other definite suspicious appearing pulmonary
nodules or masses are identified. There is a background of mild to
moderate centrilobular and paraseptal emphysema, most pronounced
throughout the lung apices bilaterally. Compared to the prior
examination, the previously noted bilateral pleural effusions have
resolved. Multifocal areas of mild pleural thickening are again
noted in the thorax bilaterally, some of which appear partially
calcified, without a frank plaque-like appearance. Possible suture
line in the periphery of the right middle lobe, suggesting prior
wedge resection. No hypermetabolic mediastinal or hilar nodes. Heart
size is markedly enlarged with biatrial dilatation. Curvilinear
hypoattenuation throughout the apex of the left ventricle,
presumably fibrofatty remodeling related to prior apical infarction.
There is no significant pericardial fluid, thickening or pericardial
calcification. There is atherosclerosis of the thoracic aorta, the
great vessels of the mediastinum and the coronary arteries,
including calcified atherosclerotic plaque in the left anterior
descending, left circumflex and right coronary arteries. Right-sided
biventricular pacemaker/AICD with lead tips terminating in the right
ventricular apex and overlying the lateral wall of the left
ventricle via the coronary sinus and coronary veins.

ABDOMEN/PELVIS

No abnormal hypermetabolic activity within the liver, pancreas,
adrenal glands, or spleen. No hypermetabolic lymph nodes in the
abdomen or pelvis. Low-attenuation lesions in the kidneys
bilaterally are incompletely characterized on today's non contrast
CT examination, but favored to represent cysts, largest of which
measures up to 3.5 cm in diameter in the posterior aspect of the
interpolar region of the right kidney. Atherosclerosis throughout
the abdominal and pelvic vasculature, without evidence of aneurysm.
No significant volume of ascites. No pneumoperitoneum. No pathologic
distention of small bowel. Penile prosthesis incidentally noted.

SKELETON

No focal hypermetabolic activity to suggest skeletal metastasis.
IMPRESSION: 1. 1.6 x 1.1 cm macrolobulated pleural-based nodule with spiculated
margins and hypermetabolism (SUVmax = 3.6) in the apex of the left
upper lobe, concerning for primary bronchogenic neoplasm. No
associated mediastinal or hilar lymphadenopathy, and no definite
signs of metastatic disease on today's examination. These findings
suggest T1a, N0, M0 disease (i.e., likely stage IA disease).
Correlation with biopsy is suggested to establish a tissue
diagnosis.
2. Resolution of previously noted bilateral pleural effusions.
3. Mild to moderate paraseptal and centrilobular emphysema most
pronounced in the lung apices.
4. Cardiomegaly with biatrial dilatation.
5. Atherosclerosis, including 3 vessel coronary artery disease.
Please note that although the presence of coronary artery calcium
documents the presence of coronary artery disease, the severity of
this disease and any potential stenosis cannot be assessed on this
non-gated CT examination. Assessment for potential risk factor
modification, dietary therapy or pharmacologic therapy may be
warranted, if clinically indicated.
6. Additional incidental findings, as above.

## 2015-06-02 ENCOUNTER — Ambulatory Visit (INDEPENDENT_AMBULATORY_CARE_PROVIDER_SITE_OTHER): Payer: Medicare Other | Admitting: *Deleted

## 2015-06-02 DIAGNOSIS — I429 Cardiomyopathy, unspecified: Secondary | ICD-10-CM | POA: Diagnosis not present

## 2015-06-02 DIAGNOSIS — I5042 Chronic combined systolic (congestive) and diastolic (congestive) heart failure: Secondary | ICD-10-CM

## 2015-06-02 DIAGNOSIS — I482 Chronic atrial fibrillation, unspecified: Secondary | ICD-10-CM

## 2015-06-02 DIAGNOSIS — I428 Other cardiomyopathies: Secondary | ICD-10-CM

## 2015-06-02 NOTE — Progress Notes (Signed)
Remote ICD transmission.   

## 2015-06-15 LAB — CUP PACEART REMOTE DEVICE CHECK
Date Time Interrogation Session: 20161122070013
HighPow Impedance: 74 Ohm
HighPow Impedance: 74 Ohm
Implantable Lead Location: 753860
Implantable Lead Model: 1158
Implantable Lead Model: 7122
Lead Channel Pacing Threshold Amplitude: 1.125 V
Lead Channel Pacing Threshold Pulse Width: 0.5 ms
Lead Channel Setting Sensing Sensitivity: 0.5 mV
MDC IDC LEAD IMPLANT DT: 20100113
MDC IDC LEAD IMPLANT DT: 20100113
MDC IDC LEAD LOCATION: 753858
MDC IDC MSMT BATTERY REMAINING LONGEVITY: 60 mo
MDC IDC MSMT BATTERY REMAINING PERCENTAGE: 64 %
MDC IDC MSMT BATTERY VOLTAGE: 2.95 V
MDC IDC MSMT LEADCHNL LV IMPEDANCE VALUE: 1000 Ohm
MDC IDC MSMT LEADCHNL RV IMPEDANCE VALUE: 390 Ohm
MDC IDC MSMT LEADCHNL RV PACING THRESHOLD AMPLITUDE: 0.625 V
MDC IDC MSMT LEADCHNL RV PACING THRESHOLD PULSEWIDTH: 0.5 ms
MDC IDC MSMT LEADCHNL RV SENSING INTR AMPL: 12 mV
MDC IDC PG SERIAL: 7053988
MDC IDC SET LEADCHNL LV PACING AMPLITUDE: 2.125
MDC IDC SET LEADCHNL LV PACING PULSEWIDTH: 0.5 ms
MDC IDC SET LEADCHNL RV PACING AMPLITUDE: 2 V
MDC IDC SET LEADCHNL RV PACING PULSEWIDTH: 0.5 ms

## 2015-06-16 ENCOUNTER — Encounter: Payer: Self-pay | Admitting: Cardiology

## 2015-06-19 ENCOUNTER — Encounter: Payer: Self-pay | Admitting: Adult Health

## 2015-06-19 ENCOUNTER — Ambulatory Visit (INDEPENDENT_AMBULATORY_CARE_PROVIDER_SITE_OTHER): Payer: Medicare Other | Admitting: Adult Health

## 2015-06-19 VITALS — BP 118/66 | HR 70 | Temp 97.5°F | Ht 73.0 in | Wt 212.0 lb

## 2015-06-19 DIAGNOSIS — J449 Chronic obstructive pulmonary disease, unspecified: Secondary | ICD-10-CM | POA: Diagnosis not present

## 2015-06-19 DIAGNOSIS — J9611 Chronic respiratory failure with hypoxia: Secondary | ICD-10-CM

## 2015-06-19 DIAGNOSIS — C3490 Malignant neoplasm of unspecified part of unspecified bronchus or lung: Secondary | ICD-10-CM

## 2015-06-19 NOTE — Assessment & Plan Note (Signed)
Follow up with oncology as planned  CT next week as planned

## 2015-06-19 NOTE — Progress Notes (Signed)
   Subjective:    Patient ID: [name], male    DOB: May 16, 1946, 69 y.o.   MRN: [id number]  HPI 69 year old male smoker with ischemic cardiomyopathy for followup of COPD, hypoxia and lung cancer.  He has a 40+ PY hx of smoking  No discernible benefit from Advair (was on for several years) , now on Symbicort   Significant tests/ events :  Admitted 08/2013 >> dc'd on O2 Echo 08/2013 -EF 30-35% akinesis of the apex and multiple wall motion abnormalities. RV showed reduced systolic function .  Cath 08/16/13 PCWP 25, Mean RA 11 mmHg  RV 54/5 mmHg  PA: 56/29 mean 39 mmHg  CO: 3.39  He had  pleural disease on right on CT chest & LUL 1.5 cm oblong opacity worrisome for malignancy  PFTs 09/2013 - FEV1 2.18- 61%, ratio 64, FVC 3.40 -71%, DLCO 11.0- 32%  CPET 11/20/13 FEV1 2.28 -58%, MVV 105 (71%), peak V O2 12.7, moderate-to-severe functional limitation due primarily to a circulatory limitation   11/29/13 left VATS with wedge resection of the left upper lobe - Dr. Roxy Manns.-invasive well-differentiated adenocarcinoma , neg LNs, close margins but no involvement  History of stage I A non-small cell lung cancer status post wedge resection of the left upper lobe in May 2015 CT chest in December showed no evidence of recurrence  06/19/2015 Follow up COPD and chronic respiratory failure on oxygen and history of lung cancer Patient returns for a six -month follow-up. Says overall he is doing well.  Wears oxygen which helps with dyspnea.  He remains on Symbicort twice daily. He denies any flare cough, wheezing or shortness of breath.  Continues to follow with Dr. Earlie Server for history of non-small lung cancer status post wedge resection of the left upper lobe in May 2015. CT chest in June  showed no evidence of recurrence. Has a follow-up CT next week planned.  Denies any hemoptysis, chest pain, orthopnea, PND or leg swelling.      Review of Systems neg for any significant sore throat, dysphagia,  itching, sneezing, nasal congestion or excess/ purulent secretions, fever, chills, sweats, unintended wt loss, pleuritic or exertional cp, hempoptysis, orthopnea pnd or change in chronic leg swelling. Also denies presyncope, palpitations, heartburn, abdominal pain, nausea, vomiting, diarrhea or change in bowel or urinary habits, dysuria,hematuria, rash, arthralgias, visual complaints, headache, numbness weakness or ataxia.     Objective:   Physical Exam  Gen. Pleasant, elderly, in no distress, chronically ill appearing on oxygen ENT - no lesions, no post nasal drip Neck: No JVD, no thyromegaly, no carotid bruits Lungs: no use of accessory muscles, no dullness to percussion, decreased breath sounds in the bases Cardiovascular: Rhythm regular, heart sounds  normal, no murmurs or gallops, no peripheral edema Musculoskeletal: No deformities, no cyanosis or clubbing         Assessment & Plan:

## 2015-06-19 NOTE — Assessment & Plan Note (Signed)
Doing well on o2 .  Cont current regimen

## 2015-06-19 NOTE — Patient Instructions (Signed)
Continue on current regimen .  Follow up for CT next week as planned  Follow up Dr. Elsworth Soho  In  6 months and As needed

## 2015-06-19 NOTE — Assessment & Plan Note (Signed)
Compensated on present regimen  No changes  

## 2015-06-22 ENCOUNTER — Other Ambulatory Visit: Payer: Self-pay | Admitting: Adult Health

## 2015-06-22 ENCOUNTER — Ambulatory Visit (HOSPITAL_COMMUNITY)
Admission: RE | Admit: 2015-06-22 | Discharge: 2015-06-22 | Disposition: A | Discharge status: Home / Self Care | Payer: Medicare Other | Source: Ambulatory Visit | Attending: Internal Medicine | Admitting: Internal Medicine

## 2015-06-22 ENCOUNTER — Other Ambulatory Visit (HOSPITAL_BASED_OUTPATIENT_CLINIC_OR_DEPARTMENT_OTHER): Payer: Medicare Other

## 2015-06-22 ENCOUNTER — Other Ambulatory Visit: Payer: Medicare Other

## 2015-06-22 ENCOUNTER — Encounter (HOSPITAL_COMMUNITY): Payer: Self-pay

## 2015-06-22 ENCOUNTER — Other Ambulatory Visit: Payer: Self-pay | Admitting: Medical Oncology

## 2015-06-22 DIAGNOSIS — C3412 Malignant neoplasm of upper lobe, left bronchus or lung: Secondary | ICD-10-CM

## 2015-06-22 DIAGNOSIS — Z08 Encounter for follow-up examination after completed treatment for malignant neoplasm: Secondary | ICD-10-CM | POA: Insufficient documentation

## 2015-06-22 DIAGNOSIS — Z85118 Personal history of other malignant neoplasm of bronchus and lung: Secondary | ICD-10-CM | POA: Diagnosis not present

## 2015-06-22 DIAGNOSIS — C3490 Malignant neoplasm of unspecified part of unspecified bronchus or lung: Secondary | ICD-10-CM

## 2015-06-22 DIAGNOSIS — J439 Emphysema, unspecified: Secondary | ICD-10-CM | POA: Insufficient documentation

## 2015-06-22 DIAGNOSIS — C3492 Malignant neoplasm of unspecified part of left bronchus or lung: Secondary | ICD-10-CM

## 2015-06-22 DIAGNOSIS — R911 Solitary pulmonary nodule: Secondary | ICD-10-CM | POA: Insufficient documentation

## 2015-06-22 LAB — CBC WITH DIFFERENTIAL/PLATELET
BASO%: 0.6 % (ref 0.0–2.0)
Basophils Absolute: 0.1 10*3/uL (ref 0.0–0.1)
EOS%: 1.9 % (ref 0.0–7.0)
Eosinophils Absolute: 0.2 10*3/uL (ref 0.0–0.5)
HEMATOCRIT: 50.6 % — AB (ref 38.4–49.9)
HEMOGLOBIN: 16.7 g/dL (ref 13.0–17.1)
LYMPH#: 1.1 10*3/uL (ref 0.9–3.3)
LYMPH%: 12.8 % — ABNORMAL LOW (ref 14.0–49.0)
MCH: 31.6 pg (ref 27.2–33.4)
MCHC: 33 g/dL (ref 32.0–36.0)
MCV: 95.7 fL (ref 79.3–98.0)
MONO#: 0.7 10*3/uL (ref 0.1–0.9)
MONO%: 8.6 % (ref 0.0–14.0)
NEUT%: 76.1 % — AB (ref 39.0–75.0)
NEUTROS ABS: 6.2 10*3/uL (ref 1.5–6.5)
PLATELETS: 179 10*3/uL (ref 140–400)
RBC: 5.29 10*6/uL (ref 4.20–5.82)
RDW: 14.8 % — AB (ref 11.0–14.6)
WBC: 8.2 10*3/uL (ref 4.0–10.3)

## 2015-06-22 LAB — COMPREHENSIVE METABOLIC PANEL
ALBUMIN: 4.5 g/dL (ref 3.5–5.0)
ALK PHOS: 63 U/L (ref 40–150)
ALT: 13 U/L (ref 0–55)
ANION GAP: 11 meq/L (ref 3–11)
AST: 15 U/L (ref 5–34)
BILIRUBIN TOTAL: 4.09 mg/dL — AB (ref 0.20–1.20)
BUN: 20.8 mg/dL (ref 7.0–26.0)
CALCIUM: 9.7 mg/dL (ref 8.4–10.4)
CO2: 29 mEq/L (ref 22–29)
CREATININE: 1.1 mg/dL (ref 0.7–1.3)
Chloride: 101 mEq/L (ref 98–109)
EGFR: 68 mL/min/{1.73_m2} — ABNORMAL LOW (ref >90)
Glucose: 144 mg/dl — ABNORMAL HIGH (ref 70–140)
Potassium: 4.6 mEq/L (ref 3.5–5.1)
Sodium: 141 mEq/L (ref 136–145)
TOTAL PROTEIN: 8 g/dL (ref 6.4–8.3)

## 2015-06-22 MED ORDER — IOHEXOL 300 MG/ML  SOLN
75.0000 mL | Freq: Once | INTRAMUSCULAR | Status: AC | PRN
  Administered 2015-06-22: 75 mL via INTRAVENOUS

## 2015-06-23 ENCOUNTER — Telehealth: Payer: Self-pay | Admitting: Adult Health

## 2015-06-23 IMAGING — CT CT HEAD WO/W CM
1 of 2 series · 13 of 30 positions shown, 17 images · IV contrast (omnipaque)
Comparison: 06/07/2010

CLINICAL DATA: Staging lung cancer.

EXAM:
CT HEAD WITHOUT AND WITH CONTRAST
TECHNIQUE: Contiguous axial images were obtained from the base of the skull
through the vertex without and with intravenous contrast
CONTRAST:  75mL OMNIPAQUE IOHEXOL 300 MG/ML  SOLN

[Series 32: 3d filtered head w/o · axial · non-contrast · 0.45mm/px · z∈[+27,+151]mm · 13 of 28 slices shown, 17 images]
[im 2/28  brain]
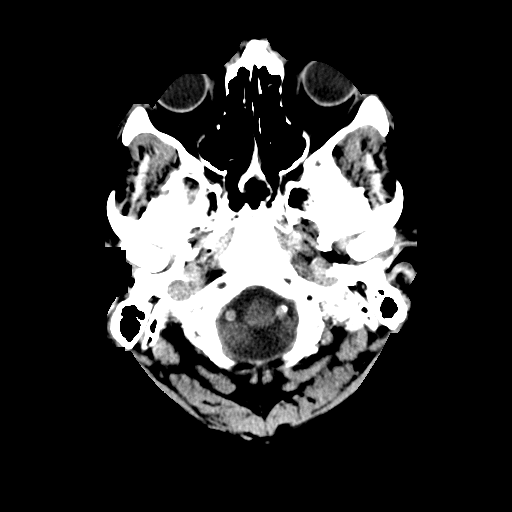
[im 2/28  bone]
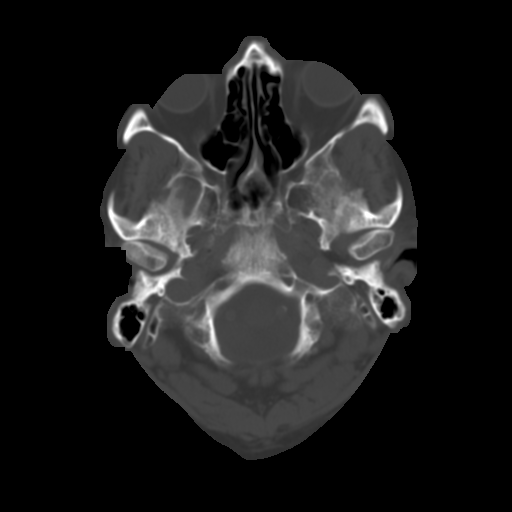
[im 4/28  brain]
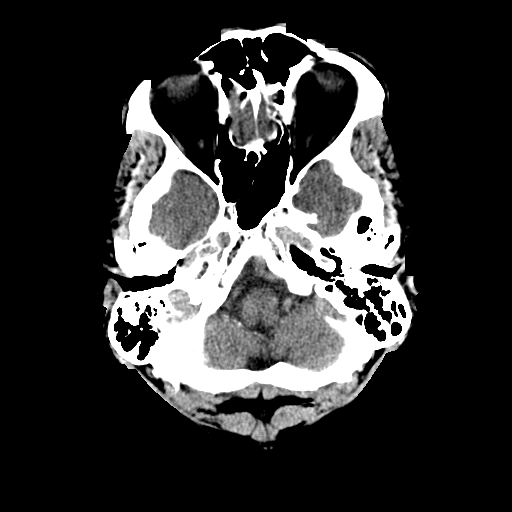
[im 6/28  brain]
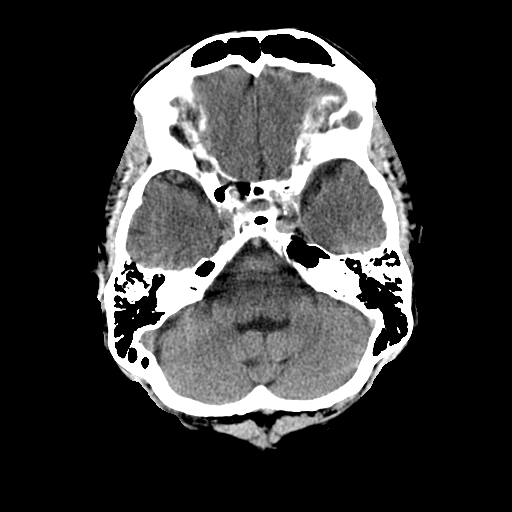
[im 8/28  brain]
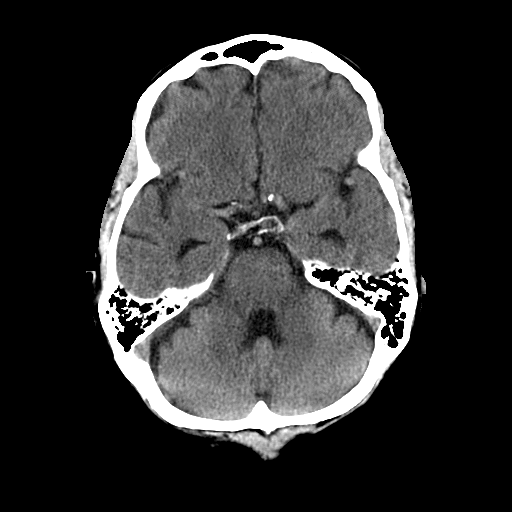
[im 10/28  brain]
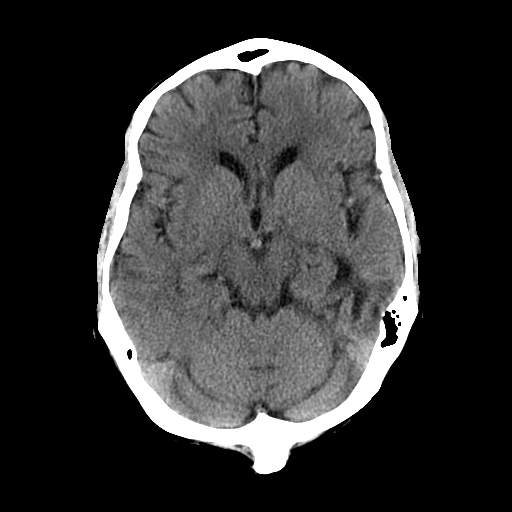
[im 10/28  bone]
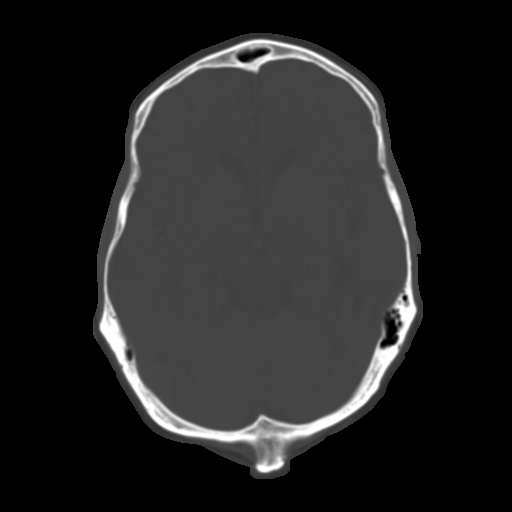
[im 12/28  brain]
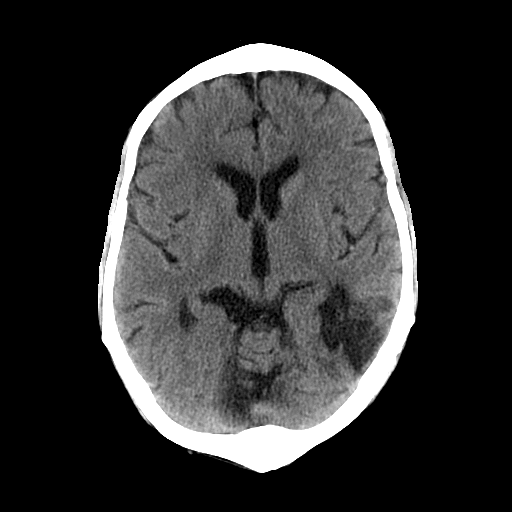
[im 14/28  brain]
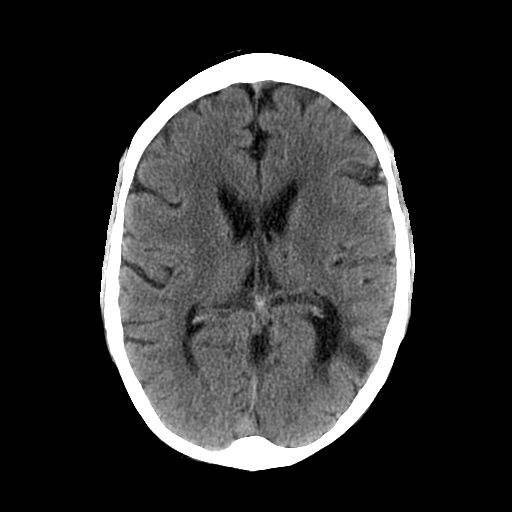
[im 16/28  brain]
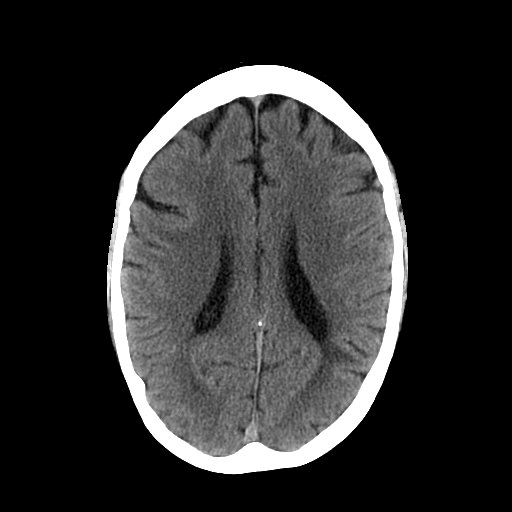
[im 18/28  brain]
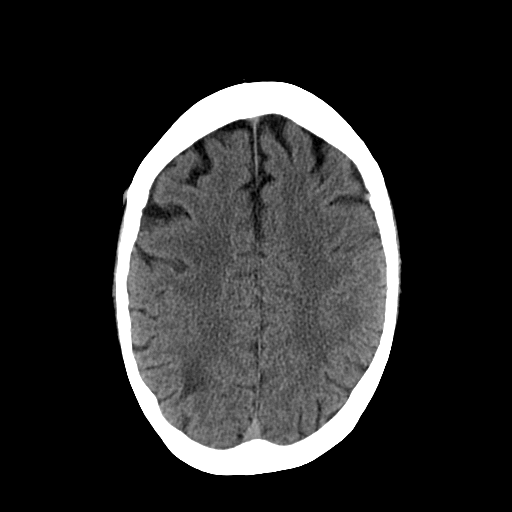
[im 18/28  bone]
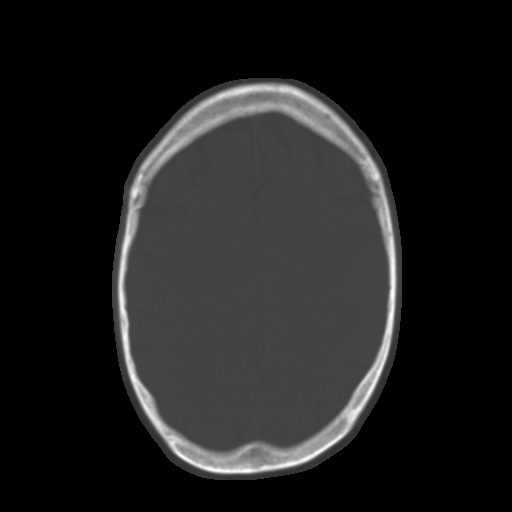
[im 20/28  brain]
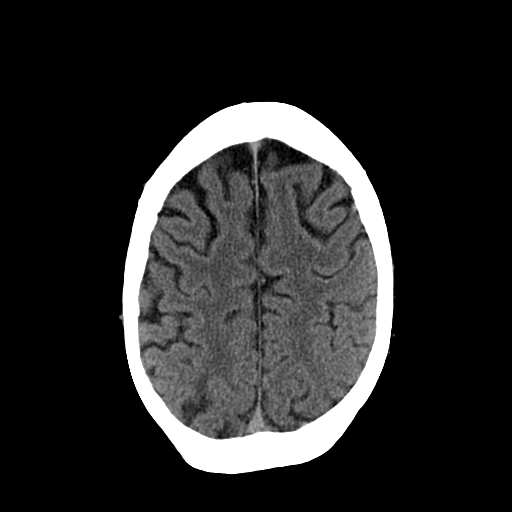
[im 22/28  brain]
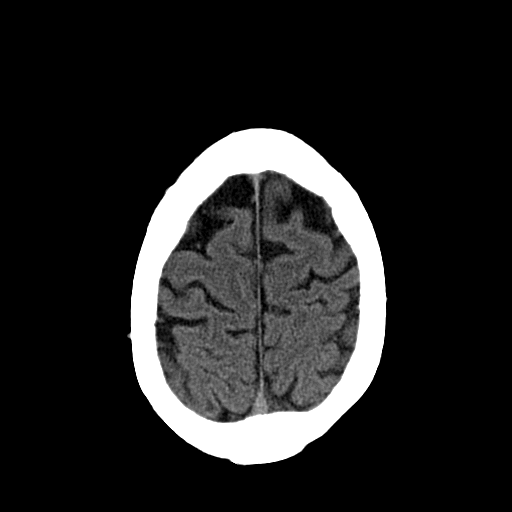
[im 24/28  brain]
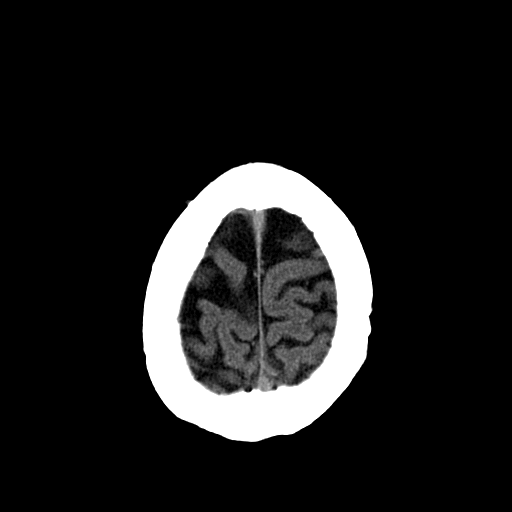
[im 26/28  brain]
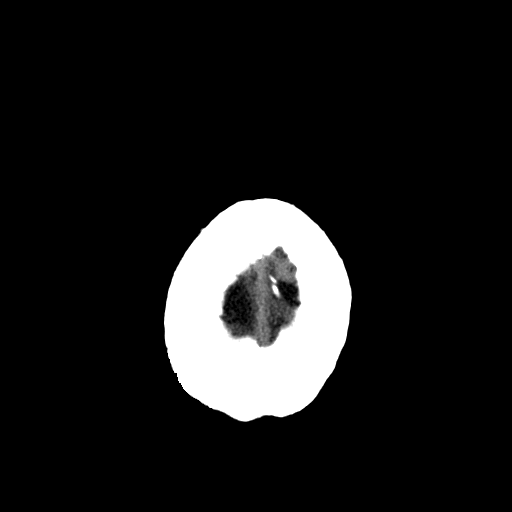
[im 26/28  bone]
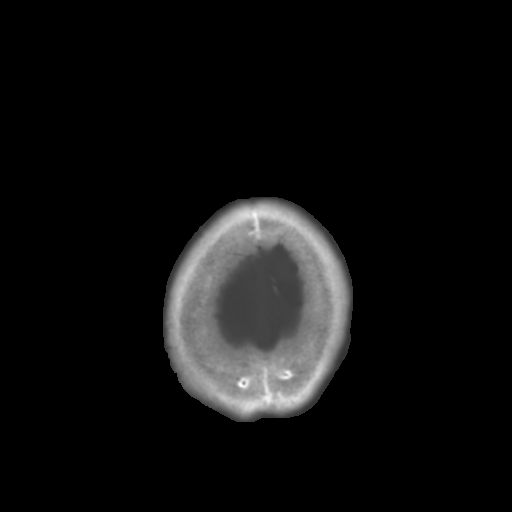

[13 of 30 positions shown; findings below may reference images not displayed]

FINDINGS: No evidence for acute infarction, hemorrhage, mass lesion,
hydrocephalus, or extra-axial fluid. Mild atrophy. Mild small vessel
disease. Bilateral chronic cerebral infarcts affect the left
posterior temporal and right parietal regions, stable from 0055. No
vasogenic edema. No abnormal postcontrast enhancement. No proximal
vascular occlusion. Prominent cortical veins. Negative orbits. No
acute sinus or mastoid fluid.
IMPRESSION: No intracranial metastatic disease. Chronic changes as described. No
osseous lesions are evident. Similar appearance to 0055.

## 2015-06-23 NOTE — Telephone Encounter (Signed)
[name] w/ Dr. Julien Nordmann office wanted the patient's PCP to know that his total bilirubin is 4.09.  It was taken on 06/22/15.

## 2015-06-24 ENCOUNTER — Telehealth: Payer: Self-pay | Admitting: Adult Health

## 2015-06-24 ENCOUNTER — Other Ambulatory Visit: Payer: Self-pay

## 2015-06-24 ENCOUNTER — Telehealth: Payer: Self-pay | Admitting: Gastroenterology

## 2015-06-24 DIAGNOSIS — R17 Unspecified jaundice: Secondary | ICD-10-CM

## 2015-06-24 NOTE — Telephone Encounter (Signed)
Pt scheduled to see Dr. Ardis Hughs 06/26/15'@1'$ :30pm. Wife aware of appt. Pt scheduled for [place] of abd at [facility] tommorow 06/25/15'@11am'$ , pt to arrive there at 10:45am and be NPO after midnight.

## 2015-06-24 NOTE — Telephone Encounter (Signed)
History of cirrhosis Would schedule abdominal ultrasound, last was in January 2016 Needs to be worked in to clinic this week with APP or available MD

## 2015-06-24 NOTE — Telephone Encounter (Signed)
Previous [name] pt. Wife called stating that pts PCP wants him seen by GI asap for elevated bili of 4.09. There are no App appts available. Dr. Hilarie Fredrickson as doc of the day please advise.

## 2015-06-24 NOTE — Telephone Encounter (Signed)
Spoke to [place] wife and informed her of her husbands elevated Bilirubin level. I advised her to make an appointment with his gastroenterologist. She will do that today

## 2015-06-25 ENCOUNTER — Ambulatory Visit (HOSPITAL_BASED_OUTPATIENT_CLINIC_OR_DEPARTMENT_OTHER)
Admission: RE | Admit: 2015-06-25 | Discharge: 2015-06-25 | Disposition: A | Discharge status: Home / Self Care | Payer: Medicare Other | Source: Ambulatory Visit | Attending: Internal Medicine | Admitting: Internal Medicine

## 2015-06-25 ENCOUNTER — Telehealth: Payer: Self-pay | Admitting: Internal Medicine

## 2015-06-25 DIAGNOSIS — R17 Unspecified jaundice: Secondary | ICD-10-CM | POA: Diagnosis not present

## 2015-06-25 DIAGNOSIS — D7389 Other diseases of spleen: Secondary | ICD-10-CM | POA: Insufficient documentation

## 2015-06-25 DIAGNOSIS — N281 Cyst of kidney, acquired: Secondary | ICD-10-CM | POA: Insufficient documentation

## 2015-06-25 DIAGNOSIS — I700 Atherosclerosis of aorta: Secondary | ICD-10-CM | POA: Insufficient documentation

## 2015-06-25 DIAGNOSIS — I701 Atherosclerosis of renal artery: Secondary | ICD-10-CM | POA: Insufficient documentation

## 2015-06-25 DIAGNOSIS — K7460 Unspecified cirrhosis of liver: Secondary | ICD-10-CM | POA: Insufficient documentation

## 2015-06-25 NOTE — Telephone Encounter (Signed)
Noted  

## 2015-06-26 ENCOUNTER — Other Ambulatory Visit (INDEPENDENT_AMBULATORY_CARE_PROVIDER_SITE_OTHER): Payer: Medicare Other

## 2015-06-26 ENCOUNTER — Encounter: Payer: Self-pay | Admitting: Gastroenterology

## 2015-06-26 ENCOUNTER — Ambulatory Visit (INDEPENDENT_AMBULATORY_CARE_PROVIDER_SITE_OTHER): Payer: Medicare Other | Admitting: Gastroenterology

## 2015-06-26 VITALS — BP 110/60 | HR 72 | Ht 73.0 in | Wt 210.8 lb

## 2015-06-26 DIAGNOSIS — R17 Unspecified jaundice: Secondary | ICD-10-CM | POA: Diagnosis not present

## 2015-06-26 DIAGNOSIS — K7460 Unspecified cirrhosis of liver: Secondary | ICD-10-CM

## 2015-06-26 LAB — PROTIME-INR
INR: 1.2 ratio — ABNORMAL HIGH (ref 0.8–1.0)
Prothrombin Time: 12.2 s (ref 9.6–13.1)

## 2015-06-26 NOTE — Patient Instructions (Signed)
[place] in 6 months for hepatoma screening. You will have labs checked today in the basement lab.  Please head down after you check out with the front desk  (inr). Your bilirubin has been elevated for 7 years at least. Please return to see Dr. Ardis Hughs in 12 months sooner if needed.

## 2015-06-26 NOTE — Progress Notes (Signed)
HPI: This is a  very pleasant 69 year old man whom I am meeting for the first time today. He was previously a patient of Dr. Kelby Fam.  Chief complaint is elevated liver tests   Recently he had labs drawn by his oncologist Was found to have elevated t bilirubin, drawn by Dr. Julien Nordmann, not sure who told him to come here for evaluation.  Getting around his usual. He is on nasal cannula oxygen he carries this around with him everywhere he goes. He is status post lung cancer surgery. He has severe CHF with an ejection fraction between 30 and 35% and an implanted defibrillator.  He has had no abdominal pains, he has noticed no yellowing of his eyes. He has no lower extremity edema.    Past Medical History  Diagnosis Date  . Hypertension   . Endocarditis     Bacterial, 2009  . Colon polyps   . Dyslipidemia   . Atrial fibrillation ([place])     AV Node ablation January, 2010, for rapid atrial fib  . Cardiomyopathy     non-ischemic  . Ejection fraction < 50%   . Atrial septal defect     Closed with surgery January, 2010  . Mitral regurgitation     Severe symptomatic primary MR due to bacterial endocarditis, treated w/ MVR  . Intracranial hemorrhage (HCC)     Coumadin cannot be used because of the history of his bleed  . Renal artery stenosis (HCC)     Mild by history  . Pulmonary hypertension (HCC)     Moderate  . HLD (hyperlipidemia)   . Automatic implantable cardioverter-defibrillator in situ     LV dysfunction and pacer needed for AV node lesion  . CVA (cerebral vascular accident) ([place]) 2009    denies residual on 08/14/2013  . CHF (congestive heart failure) ([place])   . Myocardial infarction ([place]) 2010  . Dysrhythmia   . Pneumonia     "this is my first case" (08/14/2013)  . Spontaneous pneumothorax     right thoracotomy - distant past  . Permanent atrial fibrillation     Originally Coumadin use for atrial fibrillation  //   he had intracerebral hemorrhage with an INR of 2.3 June,  2009. Anticoagulation could no longer be used.  //  Rapid atrial fibrillation after inferior MI October, 2010..........Marland Kitchen AV node ablation done at that time with ICD pacemaker placed (EF 35%).   //   Left atrial appendage tied off at the time of mitral valve surgery January, 2010 (maze pro  . Status post minimally invasive mitral valve replacement with bioprosthetic valve     33 mm Medtronic Mosaic porcine bioprosthesis placed via right mini thoracotomy for bacterial endocarditis complicated by severe MR and CHF   . Prosthetic valve dysfunction     Mild mitral stenosis  . Chronic combined systolic and diastolic CHF (congestive heart failure) ([place])   . Pacemaker   . Diabetes mellitus without complication ([place])   . Headache(784.0)     related to stroke only  . COPD (chronic obstructive pulmonary disease) (HCC)     O2- 2 liters, nasal cannula, q night   . COPD GOLD II 01/11/2007    PFT's 09/25/13  FEV1  2.18 (61%) ratio 64 no change p B2 and DLC0  32% corrects to 68% - trial off advair and acei rec starting  08/23/2013     . Lung cancer ([place]) 11/29/2013    T1N0 Stage Ia non-small cell carcinoma left lung treated with  wedge resection    Past Surgical History  Procedure Laterality Date  . Mitral valve replacement Right 07/17/2008    67m Medtronic Mosaic porcine bioprosthesis  . Cardiac defibrillator placement  ~ 2010    St Jude  . Penile prosthesis implant    . Appendectomy    . Mass biopsy Left     neck mass  . Leep    . Cataract extraction w/ intraocular lens  implant, bilateral Bilateral   . Asd repair, secundum  07/17/2008    pericardial patch closure of ASD  . Av node ablation  07/2008    for rapid atrial fib  . Thoracotomy Right 1970's    spontaneous pneumothorax - while in the [employer] . Tonsillectomy    . Insert / replace / remove pacemaker    . Cardiac valve replacement    . Cardiac catheterization    . Video assisted thoracoscopy (vats)/wedge resection Left 11/29/2013     Procedure: Video assisted thoracoscopy for wedge resection; mini thoracotomy;  Surgeon: CRexene Alberts MD;  [facility]  Service: Thoracic;  Laterality: Left;  . Implantable cardioverter defibrillator (icd) generator change N/A 02/06/2012    Procedure: ICD GENERATOR CHANGE;  Surgeon: GEvans Lance MD;  [facility];  Service: Cardiovascular;  Laterality: N/A;  . Right heart catheterization N/A 08/16/2013    Procedure: RIGHT HEART CATH;  Surgeon: PJosue Hector MD;  [facility];  Service: Cardiovascular;  Laterality: N/A;    Current Outpatient Prescriptions  Medication Sig Dispense Refill  . albuterol (PROVENTIL HFA;VENTOLIN HFA) 108 (90 BASE) MCG/ACT inhaler Inhale 2 puffs into the lungs every 6 (six) hours as needed for wheezing or shortness of breath. 1 Inhaler 6  . aspirin EC 81 MG tablet Take 1 tablet (81 mg total) by mouth daily. 90 tablet 3  . atorvastatin (LIPITOR) 10 MG tablet Take 1 tablet (10 mg total) by mouth daily. 90 tablet 3  . budesonide-formoterol (SYMBICORT) 160-4.5 MCG/ACT inhaler Inhale 2 puffs into the lungs 2 (two) times daily.    . carvedilol (COREG) 3.125 MG tablet TAKE 1 TABLET BY MOUTH TWICE A DAY WITH MEALS 180 tablet 1  . furosemide (LASIX) 40 MG tablet Take 40 mg by mouth daily. May take 1 extra daily as needed for edema    . irbesartan (AVAPRO) 75 MG tablet TAKE 1 TABLET BY MOUTH EVERY DAY 90 tablet 1  . metFORMIN (GLUCOPHAGE) 1000 MG tablet Take 1 tablet (1,000 mg total) by mouth 2 (two) times daily with a meal. (Patient taking differently: Take 1,000 mg by mouth daily with breakfast. ) 60 tablet 3  . metFORMIN (GLUCOPHAGE) 500 MG tablet TAKE 1 TABLET IN THE MORNING WITH BREAKFAST AND ONE HALF TABLET WITH YOUR EVENING MEAL (Patient taking differently: Take 1 tablet by mouth at bedtime) 150 tablet 1  . nitroGLYCERIN (NITROSTAT) 0.4 MG SL tablet Place 1 tablet (0.4 mg total) under the tongue every 5 (five) minutes as needed. For chest pain 25  tablet 1  . NON FORMULARY Wears 2L of O2 continuously     No current facility-administered medications for this visit.    Allergies as of 06/26/2015 - Review Complete 06/26/2015  Allergen Reaction Noted  . Anticoagulant compound Other (See Comments) 08/12/2013  . Warfarin sodium Other (See Comments)     Family History  Problem Relation Age of Onset  . Stomach cancer Father   . Stroke Mother     Social History   Social  History  . Marital Status: Married    Spouse Name: [name]  . Number of Children: 0  . Years of Education: College   Occupational History  . Retired     Tour manager   Social History Main Topics  . Smoking status: Former Smoker -- 1.00 packs/day for 45 years    Types: Cigarettes    Quit date: 08/11/2013  . Smokeless tobacco: Never Used     Comment: 08/14/2013 "quit smoking in 2009"  . Alcohol Use: No     Comment: 08/14/2013 "used to drink beer; quit:in 1982"  . Drug Use: No  . Sexual Activity: Yes   Other Topics Concern  . Not on file   Social History Narrative   Patient lives at home with his spouse.   Caffeine Use: none   Worked for the [employer]   Has two boys and a girl. All live local.      Physical Exam: BP 110/60 mmHg  Pulse 72  Ht '6\' 1"'$  (1.854 m)  Wt 210 lb 12.8 oz (95.618 kg)  BMI 27.82 kg/m2 Constitutional:Chronically ill-appearing with nasal cannula oxygen in place  Psychiatric: alert and oriented x3 Abdomen: soft, nontender, nondistended, no obvious ascites, no peritoneal signs, normal bowel sounds No lower extremity edema  Assessment and plan: 68 y.o. male with cirrhosis, chronically elevated total bilirubin  He recently had lab tests by his oncologist and it showed his total bilirubin was 4.1. This is not new for him, in fact his bilirubin has been ranging from 2-4 since at least 2010, 7 years ago. He does have cirrhosis based on imaging. This was evaluated by Nicoletta Ba in Dr. Deatra Ina earlier this year. He had a strong  history of alcohol abuse and they suspected that was the cause. Viral serologies were drawn and they were all negative.. He began immunization series for hepatitis A and B. Recent ultrasound done earlier this week shows cirrhotic liver without any discrete liver lesions. This is the same as his liver looked earlier this year.  He clearly has cirrhosis as well as several other significant medical issues including oxygen dependent COPD, severe heart failure with an EF of 30-35%, status post implanted defibrillator. I don't believe he is symptomatic from his cirrhosis area he was sent here for evaluation of elevated bilirubin which on review is a chronic situation for him. This is probably a combination of cirrhosis and also possible she'll Barrett's syndrome. His total bilirubin is always elevated and the direct is always normal. This is what he was told several months ago when he was in the office by Dr. Deatra Ina and Nicoletta Ba.  He will get repeat coags drawn today. Of note his platelet count is normal and the rest of his liver tests are all completely normal and so I do not think he has significant synthetic dysfunction for his cirrhosis.  He will need periodic hepatoma screening. The next test will be in 6 months with ultrasound and we will arrange for that to be done.  He will return to see me in 12 months and sooner if needed.

## 2015-06-29 ENCOUNTER — Encounter: Payer: Self-pay | Admitting: Internal Medicine

## 2015-06-29 ENCOUNTER — Ambulatory Visit (HOSPITAL_BASED_OUTPATIENT_CLINIC_OR_DEPARTMENT_OTHER): Payer: Medicare Other | Admitting: Internal Medicine

## 2015-06-29 ENCOUNTER — Telehealth: Payer: Self-pay | Admitting: Internal Medicine

## 2015-06-29 VITALS — BP 114/58 | HR 70 | Temp 98.0°F | Resp 18 | Ht 74.0 in | Wt 210.9 lb

## 2015-06-29 DIAGNOSIS — C3412 Malignant neoplasm of upper lobe, left bronchus or lung: Secondary | ICD-10-CM

## 2015-06-29 DIAGNOSIS — R748 Abnormal levels of other serum enzymes: Secondary | ICD-10-CM | POA: Diagnosis not present

## 2015-06-29 IMAGING — CR DG CHEST 2V
2 series · 2 of 2 positions shown · non-contrast
Comparison: 08/16/2013.

CLINICAL DATA: Hypertension.  Endocarditis.

EXAM:
CHEST  2 VIEW

[w chest pa]
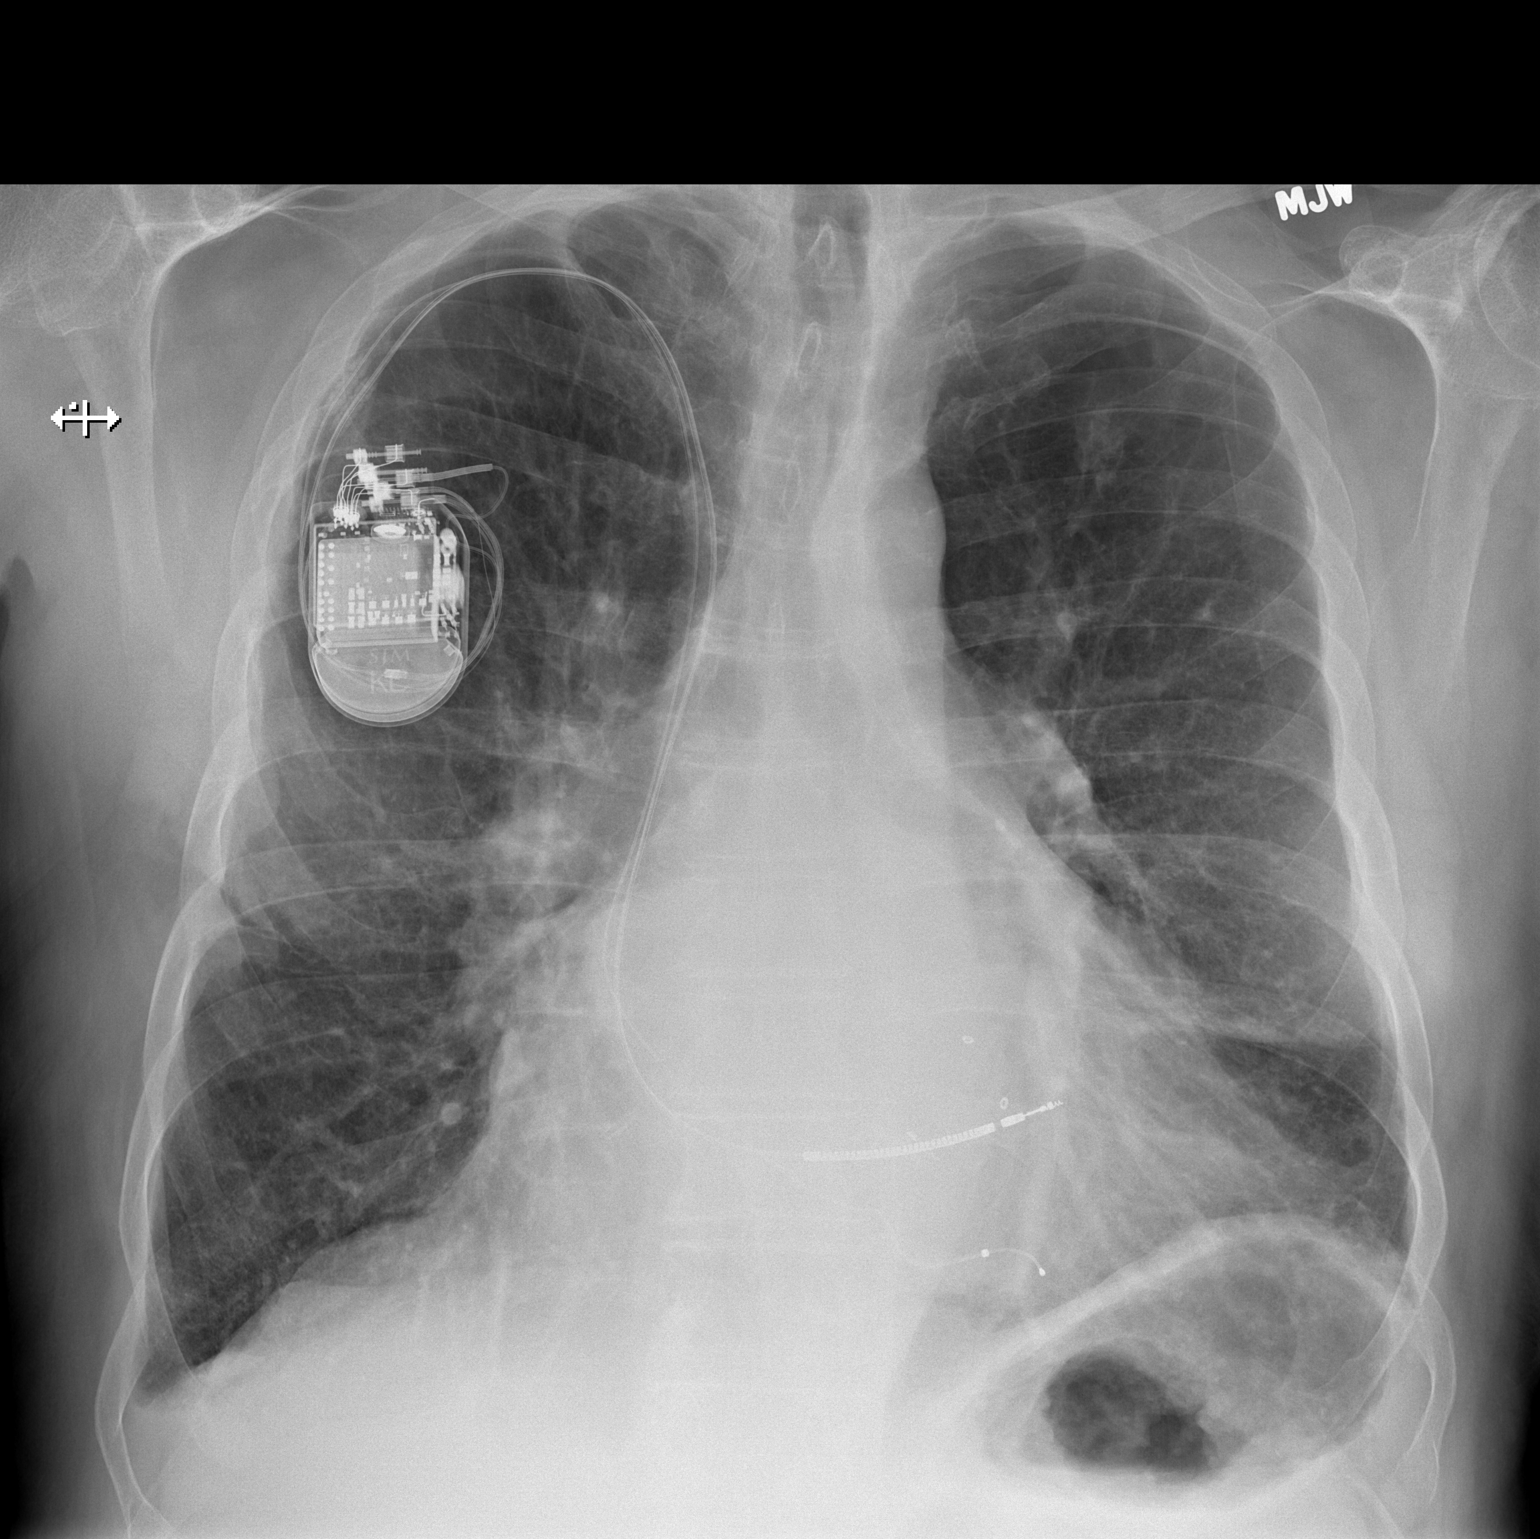

[w chest lat]
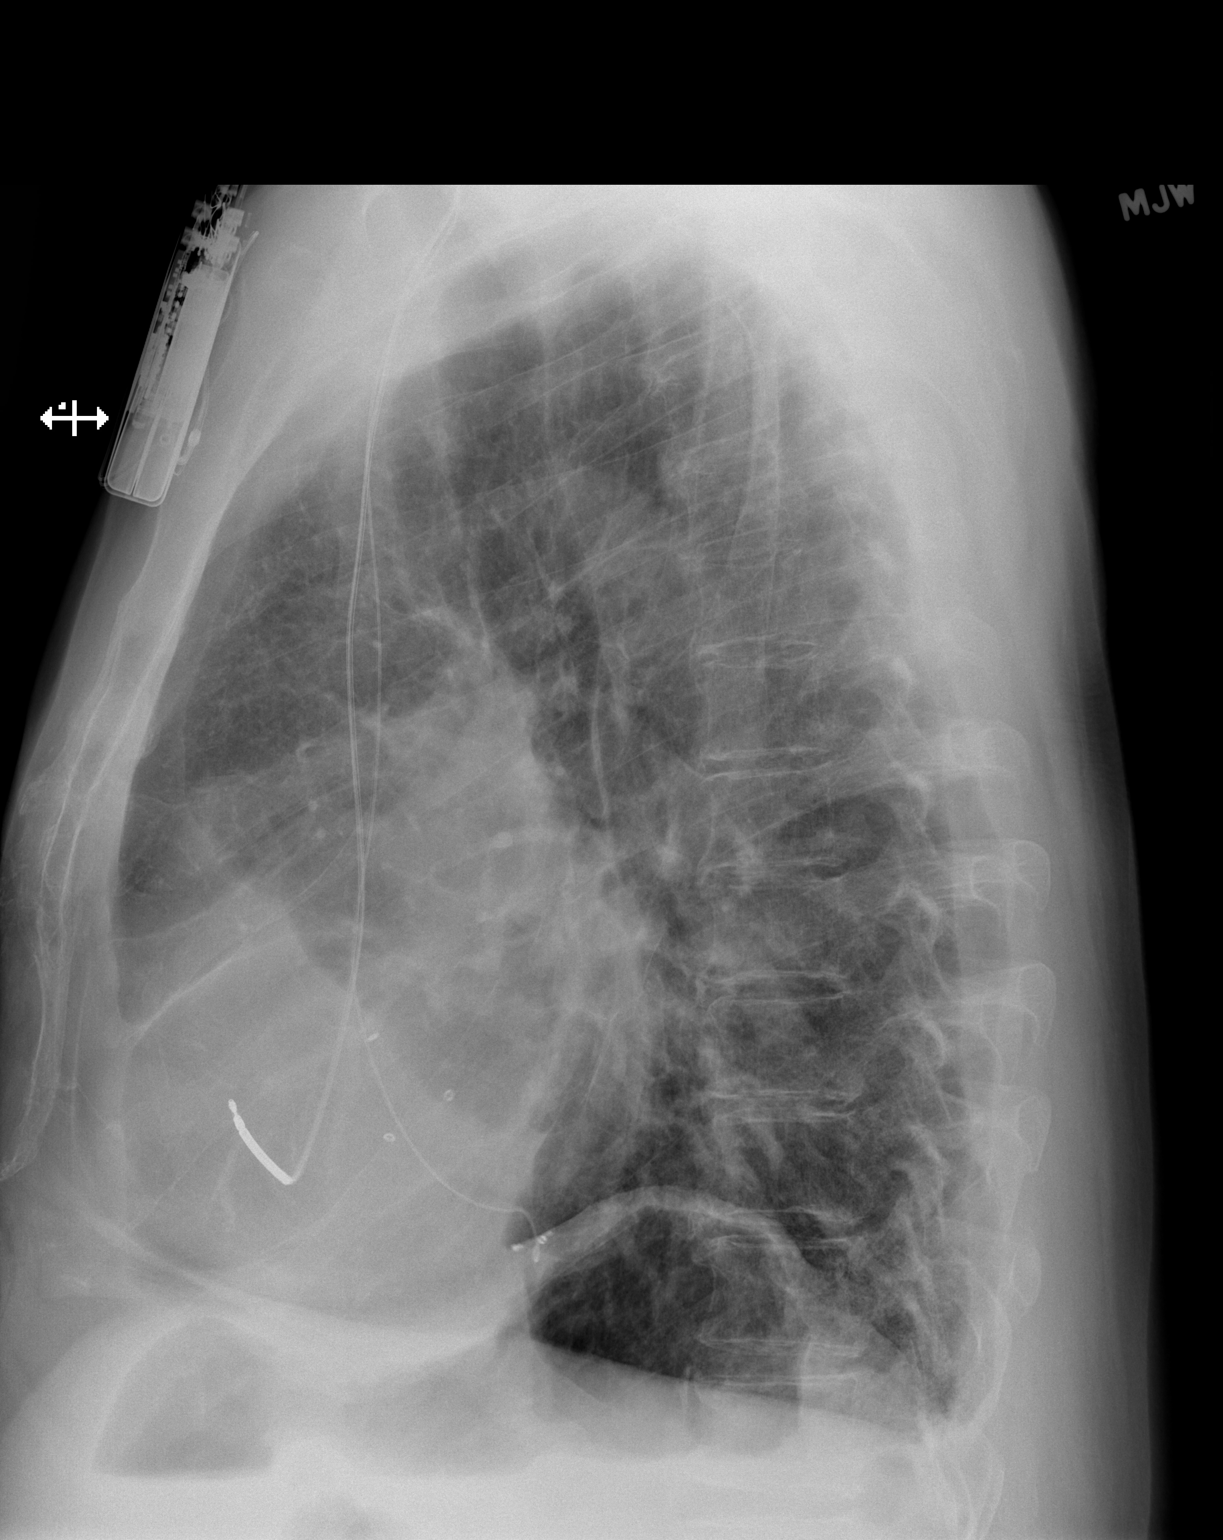

[2 of 2 positions shown; findings below may reference images not displayed]

FINDINGS: The heart is enlarged but stable. The pacer wires are stable. There
are advanced emphysematous changes and chronic pulmonary scarring
along with pleural thickening. No acute overlying pulmonary process.
No pleural effusion.
IMPRESSION: Stable cardiac enlargement and significant chronic lung changes but
no definite acute overlying pulmonary process.

## 2015-06-29 NOTE — Telephone Encounter (Signed)
per pof to sch pt appt-gave pt copy of avs-adv pt [facility] will call to sch

## 2015-06-29 NOTE — Progress Notes (Signed)
[place] [phone]   [phone]  OFFICE PROGRESS NOTE  [name], NP [place] [address]  DIAGNOSIS: Stage IA (T1a, N0, M0) non-small cell lung cancer, well-differentiated adenocarcinoma diagnosed in February of 2015   PRIOR THERAPY: Status post wedge resection of the left upper lobe under the care of Dr. Roxy Manns on 11/29/2013.  CURRENT THERAPY: Observation.  INTERVAL HISTORY: [name] 69 y.o. male returns to the clinic today for six-month follow-up visit. The patient is feeling fine today with no specific complaints except for the baseline shortness of breath and he is currently on home oxygen. He denied having any significant weight loss or night sweats. The patient denied having any nausea, vomiting, abdominal pain, diarrhea or constipation. He has no chest pain, cough or hemoptysis. His serum bilirubin has been elevated over the last few years with unclear etiology. He had ultrasound of the abdomen performed recently that showed evidence for liver cirrhosis. He was seen by gastroenterology by Dr. Hilarie Fredrickson recommended continuous observation. The patient has repeat CT scan of the chest performed recently and he is here for evaluation and discussion of his scan results.  MEDICAL HISTORY: Past Medical History  Diagnosis Date  . Hypertension   . Endocarditis     Bacterial, 2009  . Colon polyps   . Dyslipidemia   . Atrial fibrillation ([place])     AV Node ablation January, 2010, for rapid atrial fib  . Cardiomyopathy     non-ischemic  . Ejection fraction < 50%   . Atrial septal defect     Closed with surgery January, 2010  . Mitral regurgitation     Severe symptomatic primary MR due to bacterial endocarditis, treated w/ MVR  . Intracranial hemorrhage (HCC)     Coumadin cannot be used because of the history of his bleed  . Renal artery stenosis (HCC)     Mild by history  . Pulmonary hypertension (HCC)    Moderate  . HLD (hyperlipidemia)   . Automatic implantable cardioverter-defibrillator in situ     LV dysfunction and pacer needed for AV node lesion  . CVA (cerebral vascular accident) ([place]) 2009    denies residual on 08/14/2013  . CHF (congestive heart failure) ([place])   . Myocardial infarction ([place]) 2010  . Dysrhythmia   . Pneumonia     "this is my first case" (08/14/2013)  . Spontaneous pneumothorax     right thoracotomy - distant past  . Permanent atrial fibrillation     Originally Coumadin use for atrial fibrillation  //   he had intracerebral hemorrhage with an INR of 2.3 June, 2009. Anticoagulation could no longer be used.  //  Rapid atrial fibrillation after inferior MI October, 2010..........Marland Kitchen AV node ablation done at that time with ICD pacemaker placed (EF 35%).   //   Left atrial appendage tied off at the time of mitral valve surgery January, 2010 (maze pro  . Status post minimally invasive mitral valve replacement with bioprosthetic valve     33 mm Medtronic Mosaic porcine bioprosthesis placed via right mini thoracotomy for bacterial endocarditis complicated by severe MR and CHF   . Prosthetic valve dysfunction     Mild mitral stenosis  . Chronic combined systolic and diastolic CHF (congestive heart failure) ([place])   . Pacemaker   . Diabetes mellitus without complication ([place])   . Headache(784.0)     related to stroke only  . COPD (chronic obstructive  pulmonary disease) (HCC)     O2- 2 liters, nasal cannula, q night   . COPD GOLD II 01/11/2007    PFT's 09/25/13  FEV1  2.18 (61%) ratio 64 no change p B2 and DLC0  32% corrects to 68% - trial off advair and acei rec starting  08/23/2013     . Lung cancer ([place]) 11/29/2013    T1N0 Stage Ia non-small cell carcinoma left lung treated with wedge resection    ALLERGIES:  is allergic to anticoagulant compound and warfarin sodium.  MEDICATIONS:  Current Outpatient Prescriptions  Medication Sig Dispense Refill  . albuterol (PROVENTIL  HFA;VENTOLIN HFA) 108 (90 BASE) MCG/ACT inhaler Inhale 2 puffs into the lungs every 6 (six) hours as needed for wheezing or shortness of breath. 1 Inhaler 6  . aspirin EC 81 MG tablet Take 1 tablet (81 mg total) by mouth daily. 90 tablet 3  . atorvastatin (LIPITOR) 10 MG tablet Take 1 tablet (10 mg total) by mouth daily. 90 tablet 3  . budesonide-formoterol (SYMBICORT) 160-4.5 MCG/ACT inhaler Inhale 2 puffs into the lungs 2 (two) times daily.    . carvedilol (COREG) 3.125 MG tablet TAKE 1 TABLET BY MOUTH TWICE A DAY WITH MEALS 180 tablet 1  . furosemide (LASIX) 40 MG tablet Take 40 mg by mouth daily. May take 1 extra daily as needed for edema    . irbesartan (AVAPRO) 75 MG tablet TAKE 1 TABLET BY MOUTH EVERY DAY 90 tablet 1  . metFORMIN (GLUCOPHAGE) 1000 MG tablet Take 1 tablet (1,000 mg total) by mouth 2 (two) times daily with a meal. (Patient taking differently: Take 1,000 mg by mouth daily with breakfast. ) 60 tablet 3  . metFORMIN (GLUCOPHAGE) 500 MG tablet TAKE 1 TABLET IN THE MORNING WITH BREAKFAST AND ONE HALF TABLET WITH YOUR EVENING MEAL (Patient taking differently: Take 1 tablet by mouth at bedtime) 150 tablet 1  . nitroGLYCERIN (NITROSTAT) 0.4 MG SL tablet Place 1 tablet (0.4 mg total) under the tongue every 5 (five) minutes as needed. For chest pain 25 tablet 1  . NON FORMULARY Wears 2L of O2 continuously     No current facility-administered medications for this visit.    SURGICAL HISTORY:  Past Surgical History  Procedure Laterality Date  . Mitral valve replacement Right 07/17/2008    75m Medtronic Mosaic porcine bioprosthesis  . Cardiac defibrillator placement  ~ 2010    St Jude  . Penile prosthesis implant    . Appendectomy    . Mass biopsy Left     neck mass  . Leep    . Cataract extraction w/ intraocular lens  implant, bilateral Bilateral   . Asd repair, secundum  07/17/2008    pericardial patch closure of ASD  . Av node ablation  07/2008    for rapid atrial fib  .  Thoracotomy Right 1970's    spontaneous pneumothorax - while in the [employer] . Tonsillectomy    . Insert / replace / remove pacemaker    . Cardiac valve replacement    . Cardiac catheterization    . Video assisted thoracoscopy (vats)/wedge resection Left 11/29/2013    Procedure: Video assisted thoracoscopy for wedge resection; mini thoracotomy;  Surgeon: CRexene Alberts MD;  [facility]  Service: Thoracic;  Laterality: Left;  . Implantable cardioverter defibrillator (icd) generator change N/A 02/06/2012    Procedure: ICD GENERATOR CHANGE;  Surgeon: GEvans Lance MD;  [facility];  Service: Cardiovascular;  Laterality: N/A;  .  Right heart catheterization N/A 08/16/2013    Procedure: RIGHT HEART CATH;  Surgeon: Josue Hector, MD;  Location: [facility];  Service: Cardiovascular;  Laterality: N/A;    REVIEW OF SYSTEMS:  A comprehensive review of systems was negative except for: Respiratory: positive for dyspnea on exertion   PHYSICAL EXAMINATION: General appearance: alert, cooperative, fatigued and no distress Head: Normocephalic, without obvious abnormality, atraumatic Neck: no adenopathy, no JVD, supple, symmetrical, trachea midline and thyroid not enlarged, symmetric, no tenderness/mass/nodules Lymph nodes: Cervical, supraclavicular, and axillary nodes normal. Resp: clear to auscultation bilaterally Back: negative, symmetric, no curvature. ROM normal. No CVA tenderness. Cardio: regular rate and rhythm, S1, S2 normal, no murmur, click, rub or gallop GI: soft, non-tender; bowel sounds normal; no masses,  no organomegaly Extremities: extremities normal, atraumatic, no cyanosis or edema  ECOG PERFORMANCE STATUS: 1 - Symptomatic but completely ambulatory  Blood pressure 114/58, pulse 70, temperature 98 F (36.7 C), temperature source Oral, resp. rate 18, height '6\' 2"'$  (1.88 m), weight 210 lb 14.4 oz (95.664 kg), SpO2 94 %.  LABORATORY DATA: Lab Results  Component Value Date    WBC 8.2 06/22/2015   HGB 16.7 06/22/2015   HCT 50.6* 06/22/2015   MCV 95.7 06/22/2015   PLT 179 06/22/2015      Chemistry      Component Value Date/Time   NA 141 06/22/2015 1128   NA 143 04/27/2015 0852   K 4.6 06/22/2015 1128   K 5.1 04/27/2015 0852   CL 101 04/27/2015 0852   CO2 29 06/22/2015 1128   CO2 31 04/27/2015 0852   BUN 20.8 06/22/2015 1128   BUN 20 04/27/2015 0852   CREATININE 1.1 06/22/2015 1128   CREATININE 0.95 04/27/2015 0852      Component Value Date/Time   CALCIUM 9.7 06/22/2015 1128   CALCIUM 9.6 04/27/2015 0852   ALKPHOS 63 06/22/2015 1128   ALKPHOS 52 12/01/2013 0400   AST 15 06/22/2015 1128   AST 16 12/01/2013 0400   ALT 13 06/22/2015 1128   ALT 10 12/01/2013 0400   BILITOT 4.09* 06/22/2015 1128   BILITOT 2.6* 12/01/2013 0400       RADIOGRAPHIC STUDIES: Ct Chest W Contrast  06/22/2015  CLINICAL DATA:  Subsequent encounter for stage I IA non-small-cell lung cancer, well differentiated adenocarcinoma diagnosed in February of 2015. Patient is status post wedge resection of the left upper lobe lesion. EXAM: CT CHEST WITH CONTRAST TECHNIQUE: Multidetector CT imaging of the chest was performed during intravenous contrast administration. CONTRAST:  73m OMNIPAQUE IOHEXOL 300 MG/ML  SOLN COMPARISON:  12/29/2014. FINDINGS: Mediastinum / Lymph Nodes: There is no axillary lymphadenopathy. No mediastinal lymphadenopathy. Scattered small paratracheal and subcarinal lymph nodes are stable. There is no axillary lymphadenopathy. Heart is enlarged. Right-sided permanent pacer/ AICD noted in situ. No pericardial effusion. The esophagus has normal imaging features. Lungs / Pleura: Centrilobular and paraseptal emphysema noted bilaterally. Suture line again noted in the left upper lobe, stable in appearance. Tiny 2-3 mm nodule in the lung near the suture line (image 20 series 5 today) is unchanged in the interval. Multiple other scattered tiny bilateral pulmonary nodules  are unchanged. Suture line also noted laterally in the right middle lobe. No new or progressive findings on today's exam. Upper Abdomen:  Unremarkable. MSK / Soft Tissues: Bone windows reveal no worrisome lytic or sclerotic osseous lesions. IMPRESSION: 1. Stable exam. No new or progressive findings to suggest recurrent or metastatic disease in the chest. 2. Stable left upper lobe  pulmonary nodule with other stable scattered tiny bilateral parenchymal nodules. 3. Emphysema. Electronically Signed   By: Misty [name] M.D.   On: 06/22/2015 13:41   US Abdomen Complete  06/25/2015  CLINICAL DATA:  Cirrhosis. Elevated bilirubin. Renal artery stenosis. History of lung cancer. EXAM: ULTRASOUND ABDOMEN COMPLETE COMPARISON:  08/08/2014 and 10/29/2013 FINDINGS: Gallbladder: Mild gallbladder wall thickening at 4 mm. Sonographic Murphy's sign absent. No stones identified. Common bile duct: Diameter: 2 mm Liver: Nodular margins compatible with cirrhosis. No focal lesion identified. IVC: Poorly seen due to overlying bowel gas. Pancreas: Visualized pancreatic body normal. Pancreatic tail and head not seen due to overlying bowel gas. Spleen: Bandlike hyperechoic structure in the superior anterior spleen favoring site of prior infarct, faintly calcified. No splenomegaly. Right Kidney: Length: 13.0 cm. Renal cysts observed, including a 3.0 by 3.0 by 4.0 cm cyst in the mid to lower kidney and also a 2.2 cm cyst in the mid kidney which has subtle medial septation with mild calcification. Left Kidney: Length: 13.7 cm. Bilateral renal cysts measuring up to 3.3 cm in long axis. Abdominal aorta: No aneurysm visualized. Atherosclerotic. Incompletely visualized due to overlying bowel gas. Other findings: None. IMPRESSION: 1. Hepatic cirrhosis. No biliary dilatation identified to suggest an obstructive cause for the hyperbilirubinemia, query hepatocellular dysfunction. 2. Gallbladder wall thickening without sonographic Murphy's sign. Cause  uncertain, cannot exclude acalculous cholecystitis. No gallstones identified. 3. Bilateral renal cysts are present. A small cyst in the right mid kidney has a small septation with some calcification along the medial border. 4. Abdominal aortic atherosclerosis. 5. Faint linear calcification in the spleen likely from prior splenic infarct. Electronically Signed   By: Van Clines M.D.   On: 06/25/2015 11:46    ASSESSMENT AND PLAN: This is a very pleasant 69 years old white male recently diagnosed with a stage IA non-small cell lung cancer status post wedge resection of the left upper lobe and he is currently on observation. The recent CT scan of the chest showed no evidence for disease recurrence.  I discussed the scan results with the patient. I recommended for him to continue on observation with repeat CT scan of the chest in 6 months. The patient has elevated serum bilirubin for the last few years of unclear etiology. He is followed by gastroenterology.  The patient would come back for follow-up visit in 6 months after repeating CT scan of the chest. He was advised to call immediately if he has any concerning symptoms in the interval. The patient voices understanding of current disease status and treatment options and is in agreement with the current care plan.  All questions were answered. The patient knows to call the clinic with any problems, questions or concerns. We can certainly see the patient much sooner if necessary.  Disclaimer: This note was dictated with voice recognition software. Similar sounding words can inadvertently be transcribed and may not be corrected upon review.

## 2015-06-30 IMAGING — CR DG CHEST 1V PORT
1 series · 1 of 1 positions shown · non-contrast
Comparison: Portable exam 0777 hr compared to 11/28/2013

CLINICAL DATA: Incorrect needle count

EXAM:
PORTABLE CHEST - 1 VIEW

[AP]
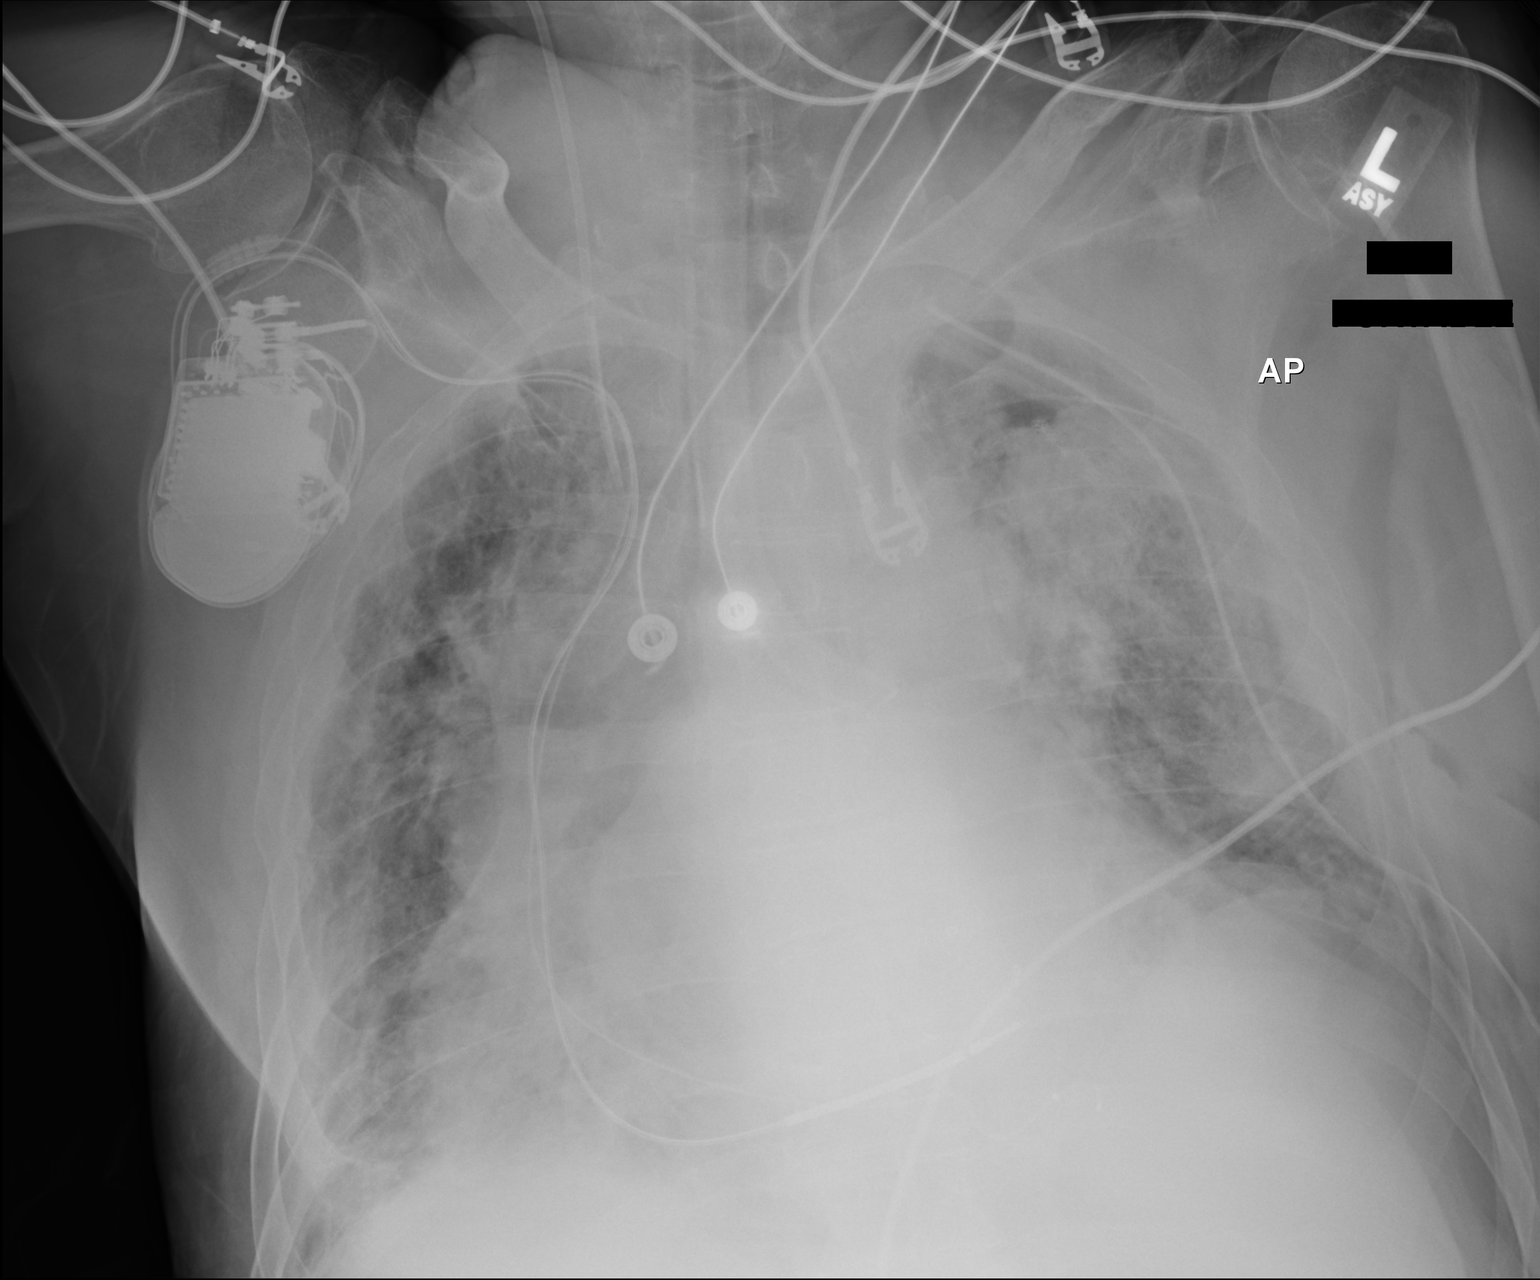

[1 of 1 positions shown; findings below may reference images not displayed]

FINDINGS: Tip of endotracheal tube projects 3.8 cm above carina with
additional limb in LEFT mainstem bronchus.

RIGHT jugular central venous catheter tip projects over SVC
confluence.

RIGHT subclavian transvenous pacemaker/ AICD leads project over
RIGHT ventricle and coronary sinus.

LEFT thoracostomy tube present.

Staple line at LEFT upper lobe.

No definite radiopaque foreign body sites retained needle
identified.

Diffuse BILATERAL pulmonary infiltrates question edema.

Atelectasis at LEFT upper lobe with small LEFT apex pneumothorax.
IMPRESSION: No definite retained metallic foreign body/needle identified.

Enlargement of cardiac silhouette with diffuse pulmonary infiltrates
likely edema.

Atelectasis in LEFT upper lobe with LEFT apex pneumothorax despite
thoracostomy tube.

Findings called to OR 15 on 11/29/2013 at 0460 hr.

## 2015-07-01 IMAGING — CR DG CHEST 1V PORT
1 series · 1 of 1 positions shown · non-contrast
Comparison: the previous day's study

CLINICAL DATA: Chest tube placement

EXAM:
PORTABLE CHEST - 1 VIEW

[AP]
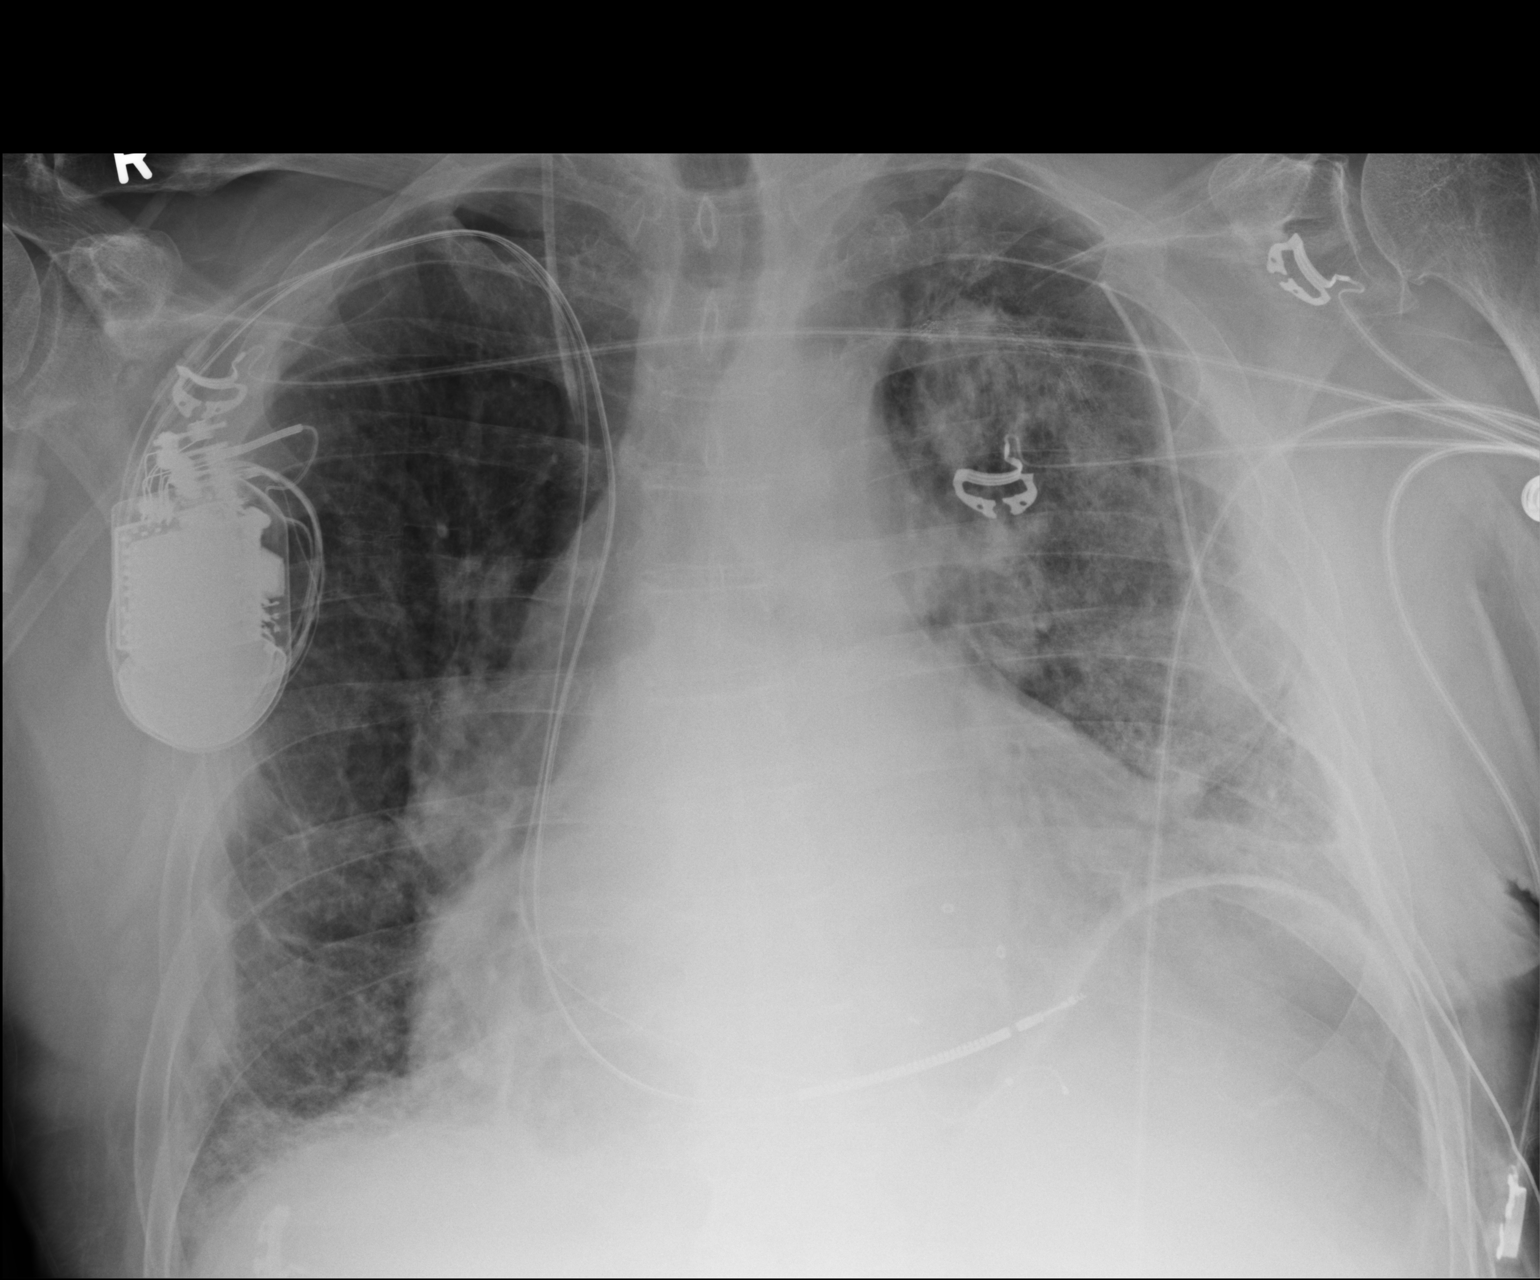

[1 of 1 positions shown; findings below may reference images not displayed]

FINDINGS: Left chest tube has retracted slightly, tip still above the level of
the aortic arch. Pleural line projects over the posterior aspect
left third rib as before consistent with small residual apical
pneumothorax. Staple line in the left suprahilar region with some
adjacent parenchymal opacification, stable. Right IJ central line
stable. Right subclavian AICD stable. Moderate cardiomegaly stable.
Coarse interstitial and alveolar opacities bilaterally left greater
than right involving bases more than apices as before. No definite
effusion. .

Degenerative changes in the left shoulder.
IMPRESSION: 1. Slight retraction of left chest tube with stable small apical
pneumothorax.
2. Little change in asymmetric pulmonary opacities left greater than
right.
3. Stable cardiomegaly.

## 2015-07-02 IMAGING — CR DG CHEST 1V PORT
1 series · 1 of 1 positions shown · non-contrast
Comparison: DG CHEST 1V PORT dated 11/30/2013; DG CHEST 1V PORT
dated 11/29/2013

CLINICAL DATA: Status post left upper lobe resection.

EXAM:
PORTABLE CHEST - 1 VIEW

[AP]
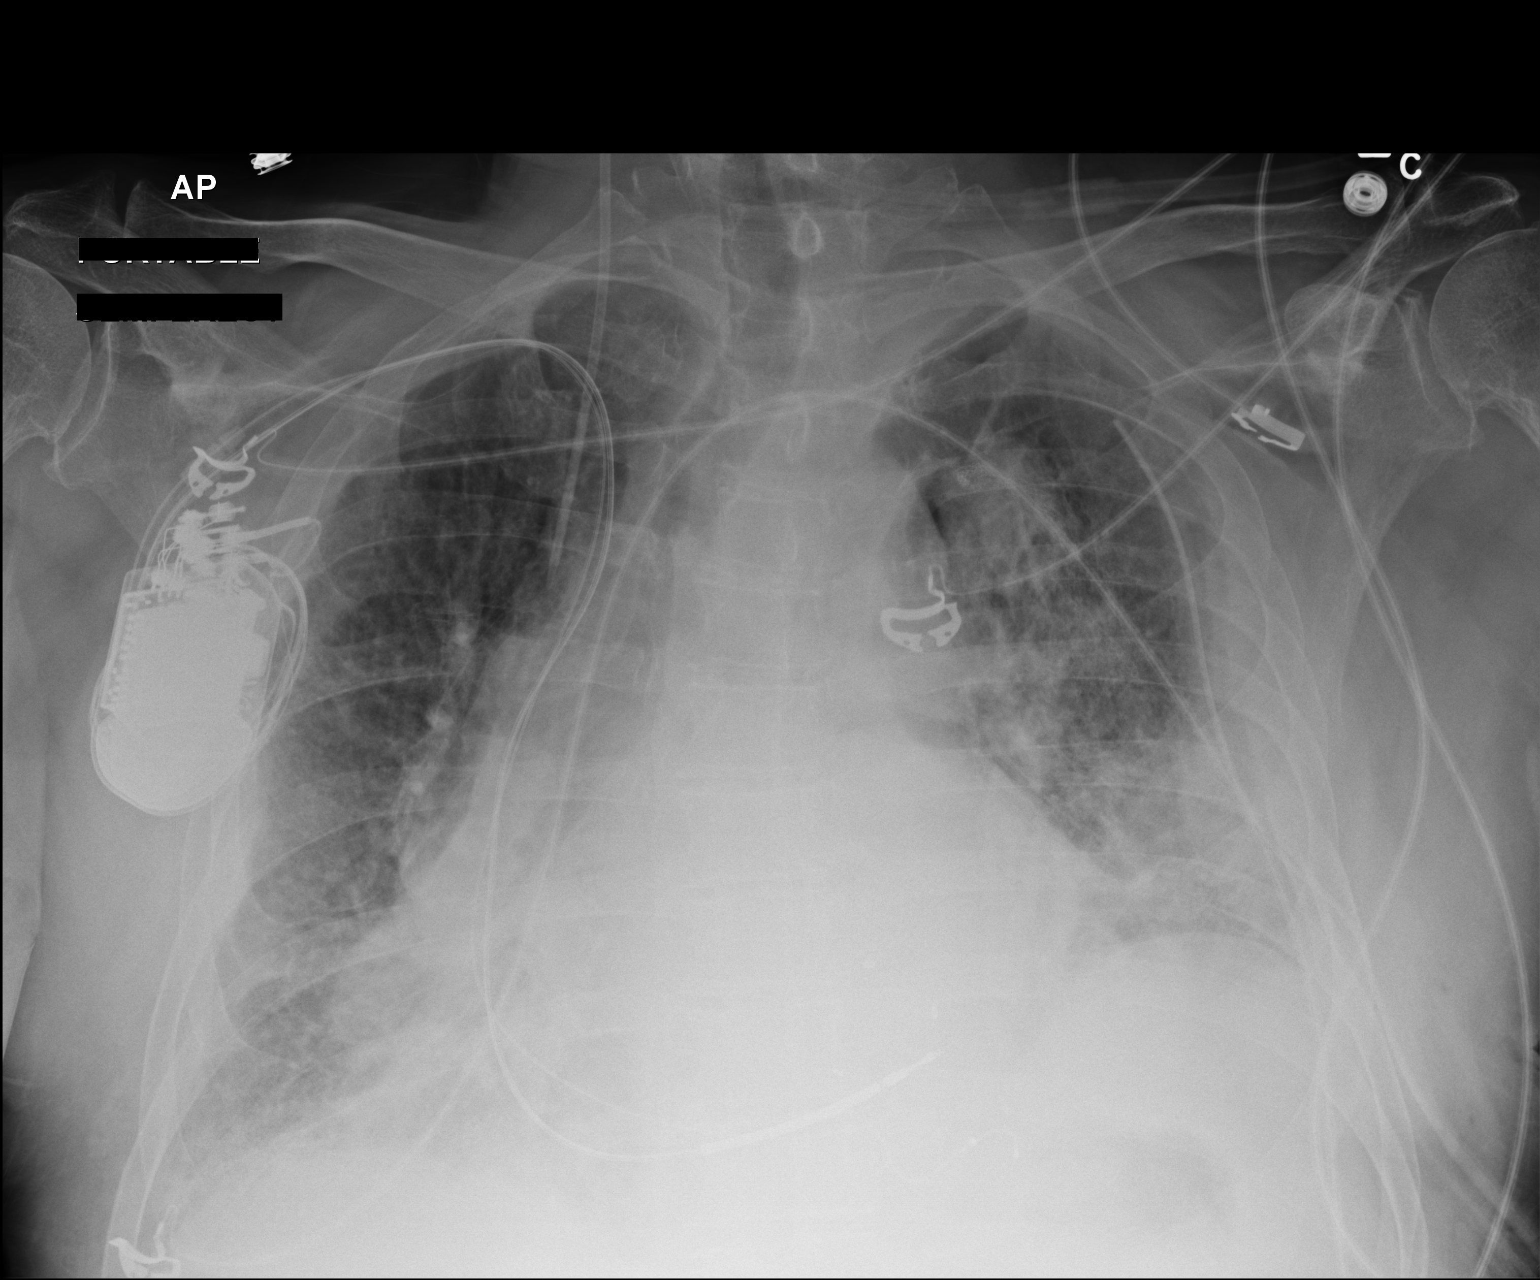

[1 of 1 positions shown; findings below may reference images not displayed]

FINDINGS: Right IJ central venous catheter tip projects over the superior vena
cava, unchanged. Multi lead pacer apparatus overlies the right chest
wall, leads appear stable in position. Stable cardiomegaly. Slight
interval increase an bilateral heterogeneous opacities, most
pronounced within the right mid lower lung. Probable small bilateral
pleural effusions. Left chest tube remains in place. Tiny residual
left apical pneumothorax, not significantly changed from prior.
IMPRESSION: Tiny residual left apical pneumothorax unchanged from prior with
left chest tube in place.

Stable support apparatus.

Cardiomegaly with slight interval increase in bilateral
heterogeneous opacities.

## 2015-07-03 IMAGING — CR DG CHEST 1V PORT
1 series · 1 of 1 positions shown · non-contrast
Comparison: 12/02/2013 and 12/01/2013.

CLINICAL DATA: Chest tube removal.  Shortness of breath.

EXAM:
PORTABLE CHEST - 1 VIEW

[portable]
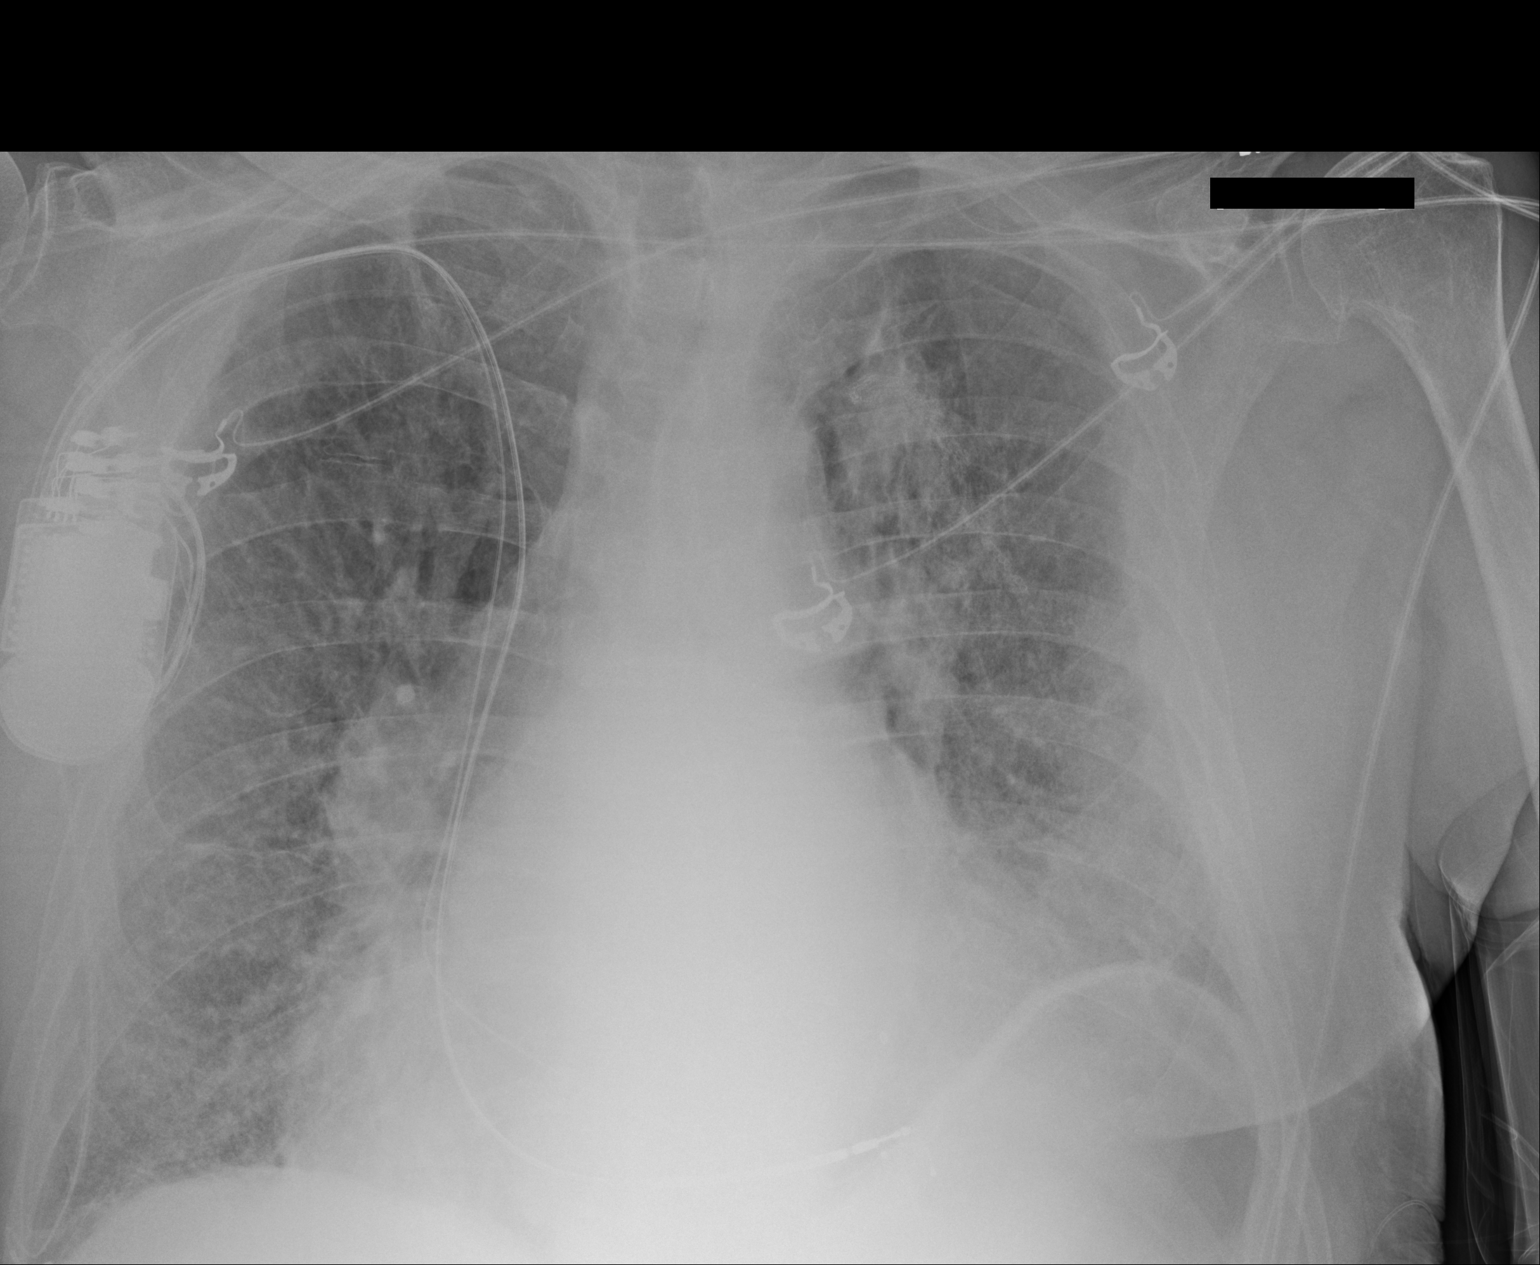

[1 of 1 positions shown; findings below may reference images not displayed]

FINDINGS: 5405 hr. Left chest tube has been removed. The left apical
pneumothorax appears unchanged. Left perihilar atelectasis/
contusion and pleural thickening or loculated pleural fluid on the
left are stable. There is persistent vascular congestion. The heart
size and mediastinal contours are stable. Right subclavian pacemaker
leads appear unchanged.
IMPRESSION: No significant change in left-sided apical pneumothorax following
left chest tube removal. Stable probable left perihilar atelectasis/
contusion and vascular congestion.

## 2015-07-03 IMAGING — CR DG CHEST 1V PORT
1 series · 1 of 1 positions shown · non-contrast
Comparison: the previous day's study

CLINICAL DATA: chest tubes in place

EXAM:
PORTABLE CHEST - 1 VIEW

[portable]
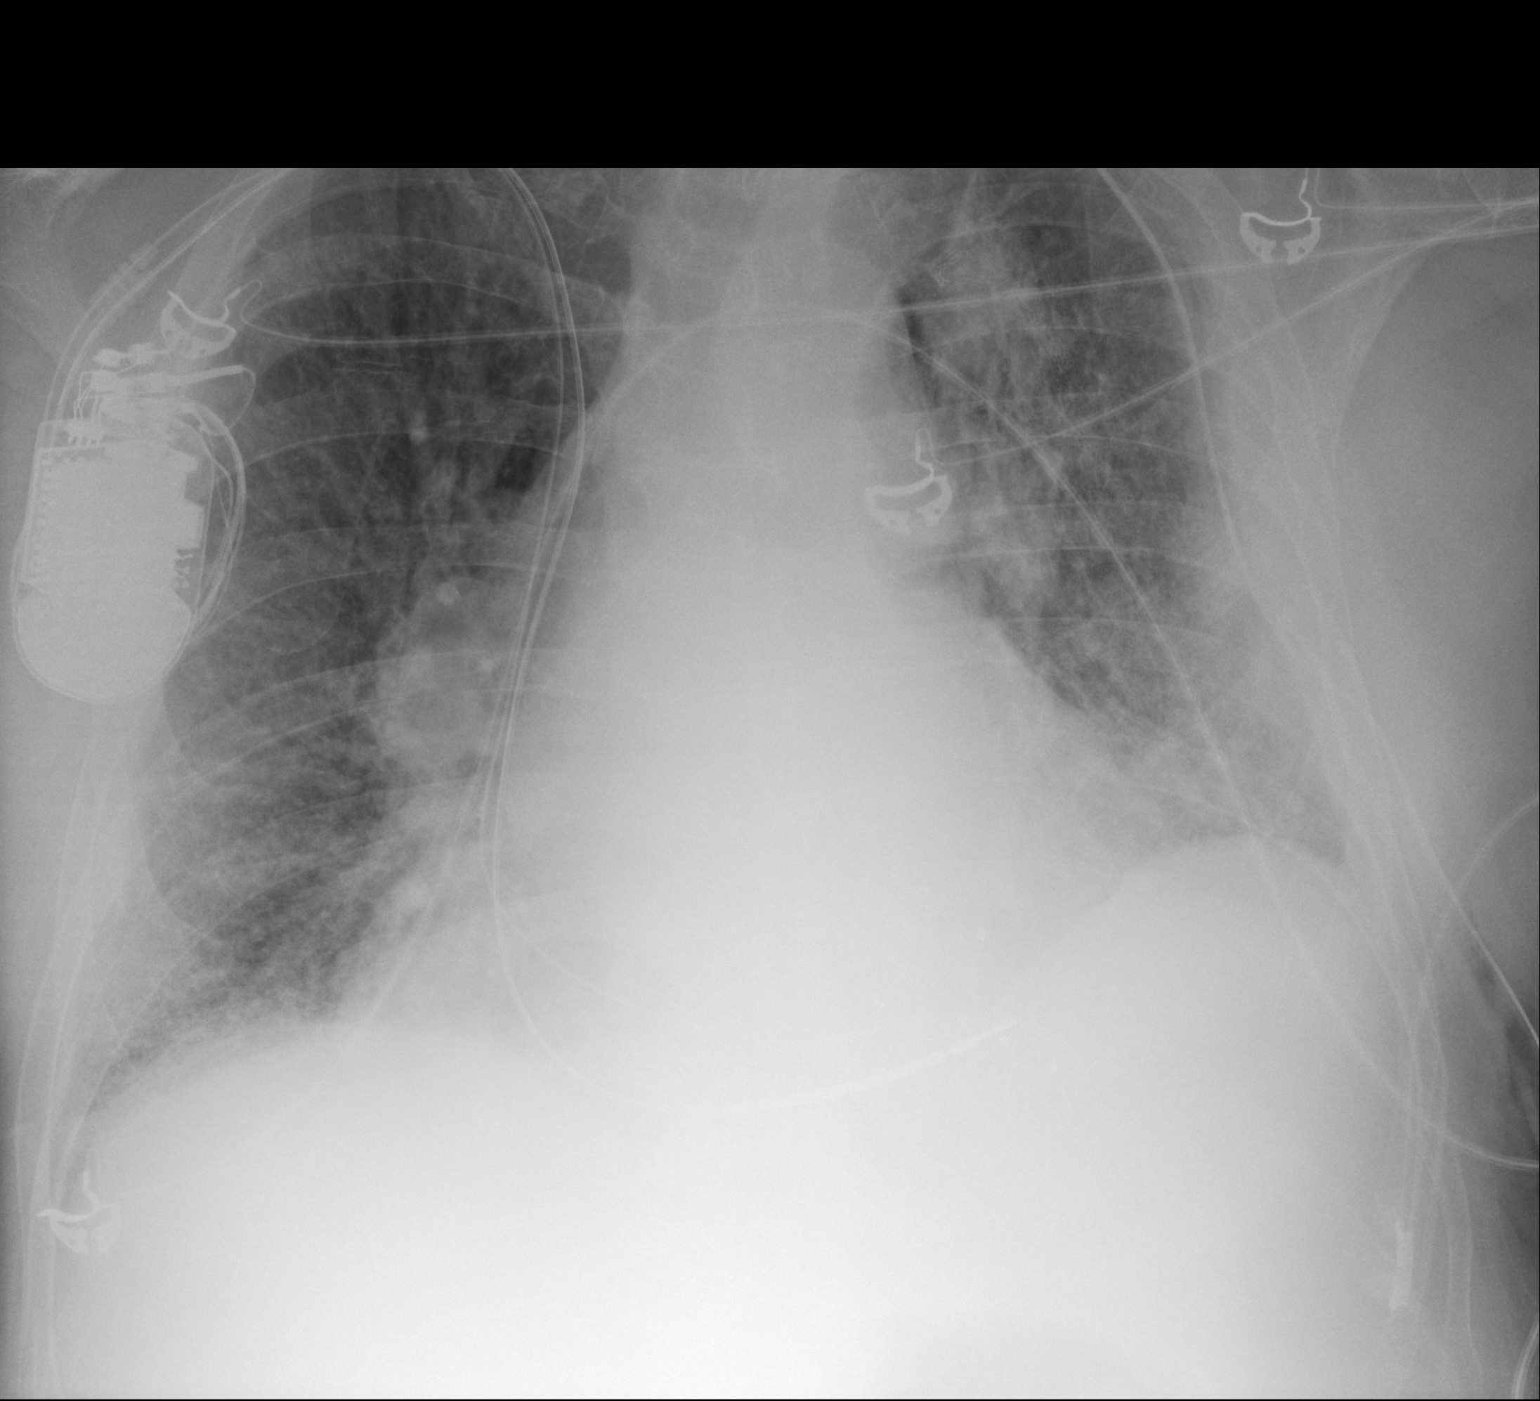

[1 of 1 positions shown; findings below may reference images not displayed]

FINDINGS: Left chest tube remains in place with probable a small residual
apical pneumothorax, projecting just below the posterior aspect
third rib. Staple line noted in the left hilar and suprahilar
regions. The right IJ central line has been removed. Right
subclavian AICD stable. Mild cardiomegaly stable. Coarse
interstitial and airspace opacities in both lung bases and around
the left hilum as before. Blunting of the lateral costophrenic angle
suggesting tiny effusions. .

Visualized skeletal structures are unremarkable.
IMPRESSION: 1. Central line removal. Otherwise stable appearance since prior
study

## 2015-07-04 IMAGING — CR DG CHEST 2V
2 series · 2 of 2 positions shown · non-contrast
Comparison: 12/02/2013

CLINICAL DATA: Left upper lobe lung resection for cancer

EXAM:
CHEST  2 VIEW

[w chest pa]
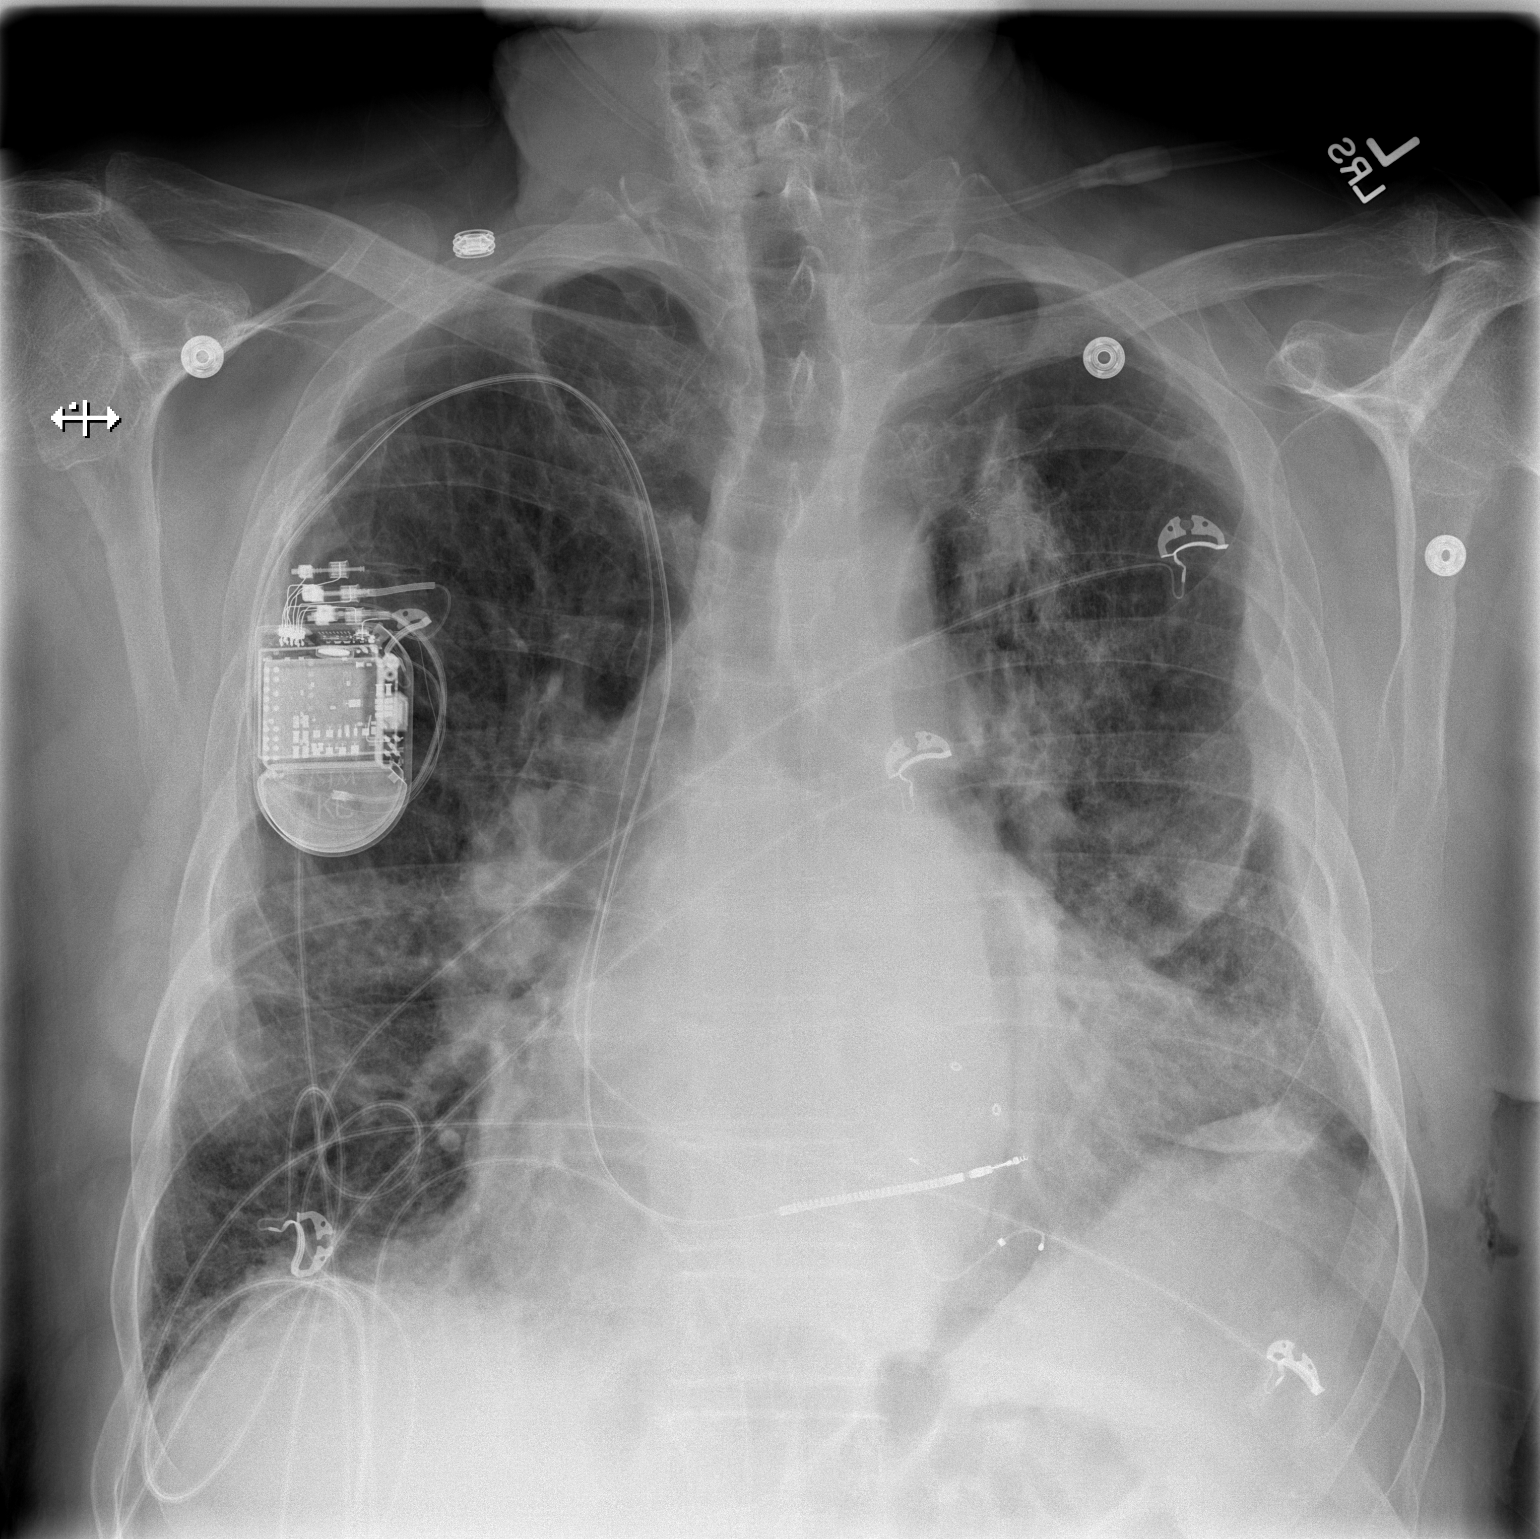

[w chest lat]
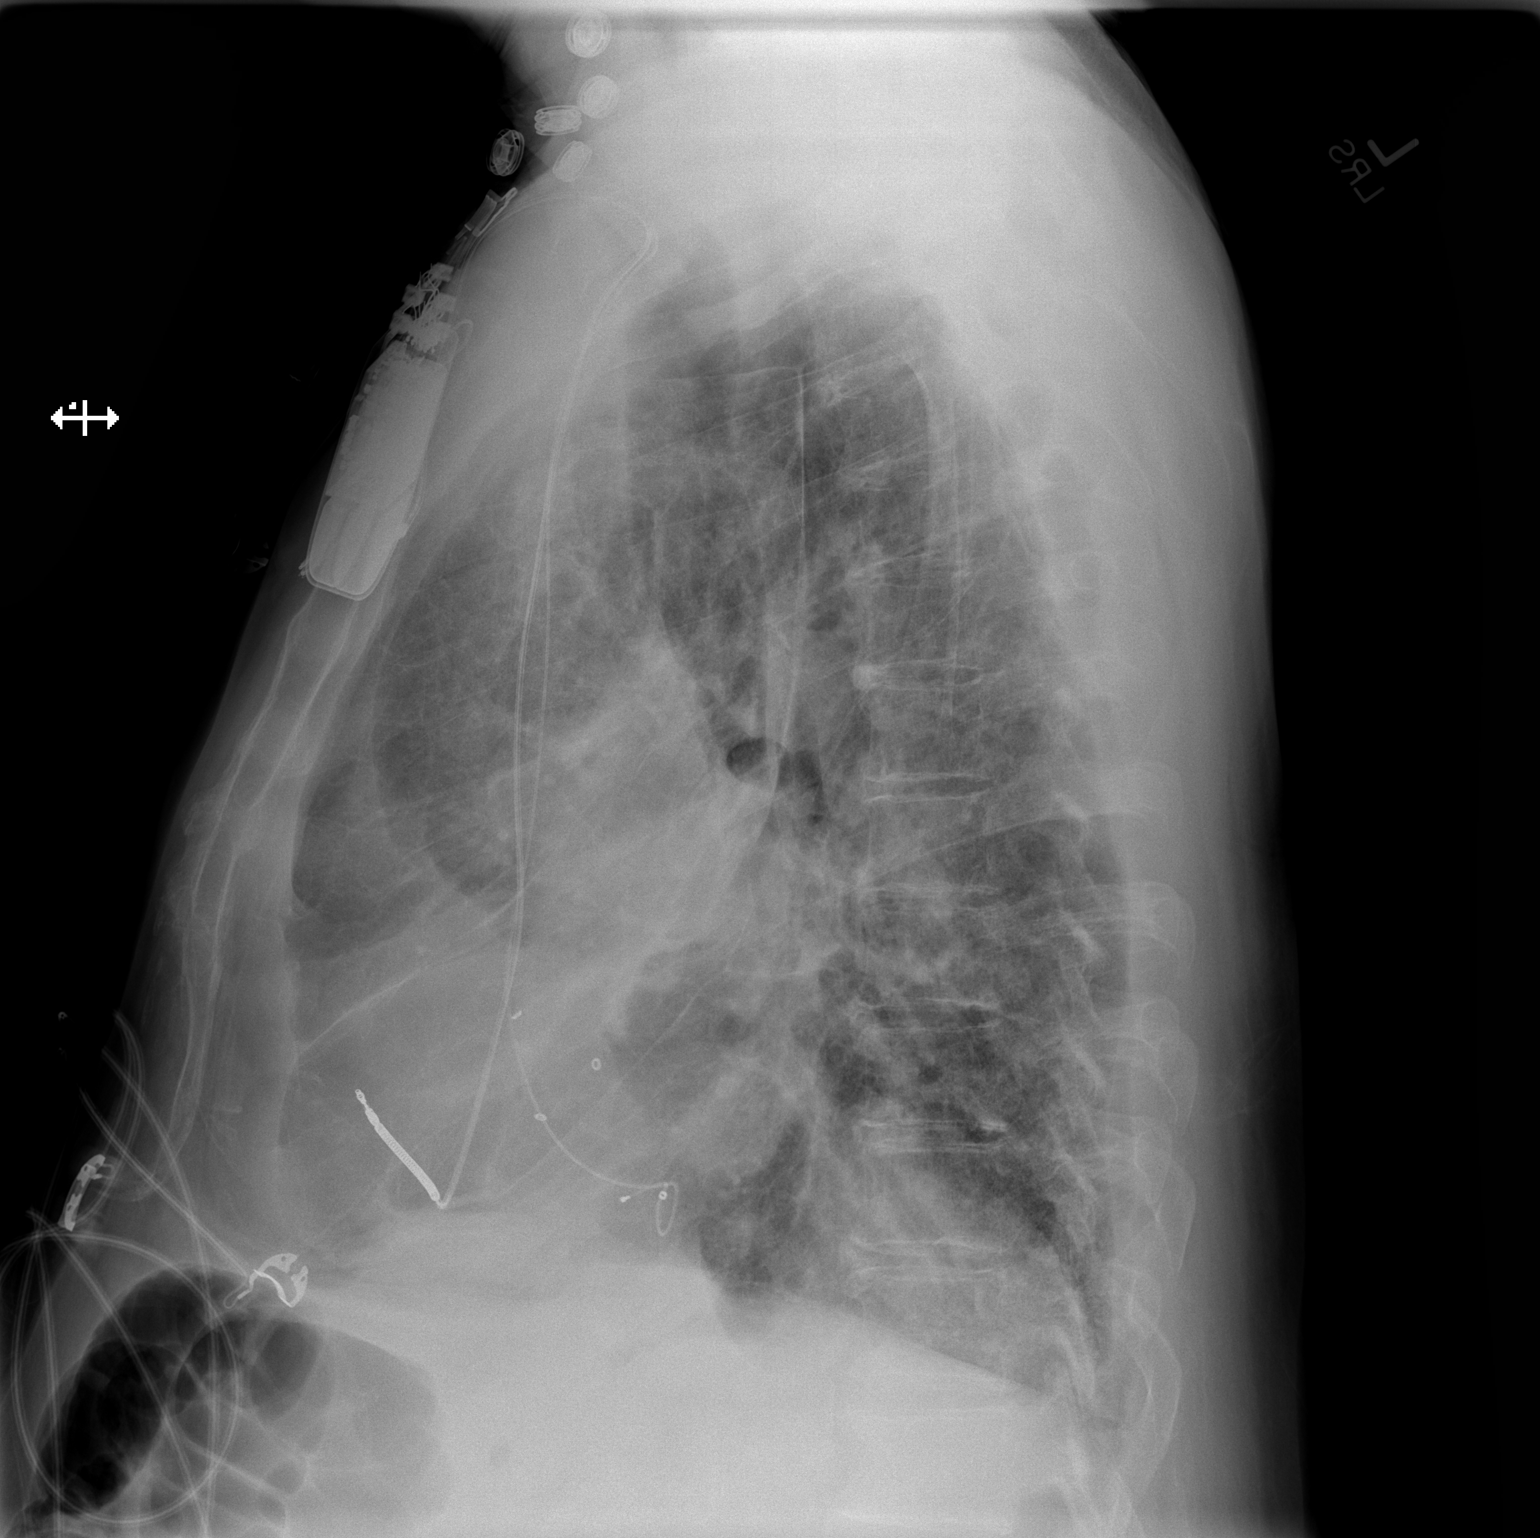

[2 of 2 positions shown; findings below may reference images not displayed]

FINDINGS: Moderate to severe cardiac enlargement stable. 2 lead cardiac pacer
is in unchanged position. Postsurgical change superior to the left
hilum is stable. Lateral left pleural thickening/loculated effusion,
unchanged. There is mild diffuse interstitial prominence. When
compared to the prior study, the severity of interstitial
opacification has decreased.
IMPRESSION: 1. Stable small left upper lobe pneumothorax
2. Interval decrease in the severity of bilateral interstitial
change likely representing resolving pulmonary edema.

## 2015-07-14 NOTE — Progress Notes (Signed)
Reviewed & agree with plan  

## 2015-07-17 IMAGING — CR DG CHEST 2V
2 series · 2 of 2 positions shown · non-contrast
Comparison: 12/03/2013.

CLINICAL DATA: Vats 11/29/2013.  Short of breath.

EXAM:
CHEST  2 VIEW

[w chest pa]
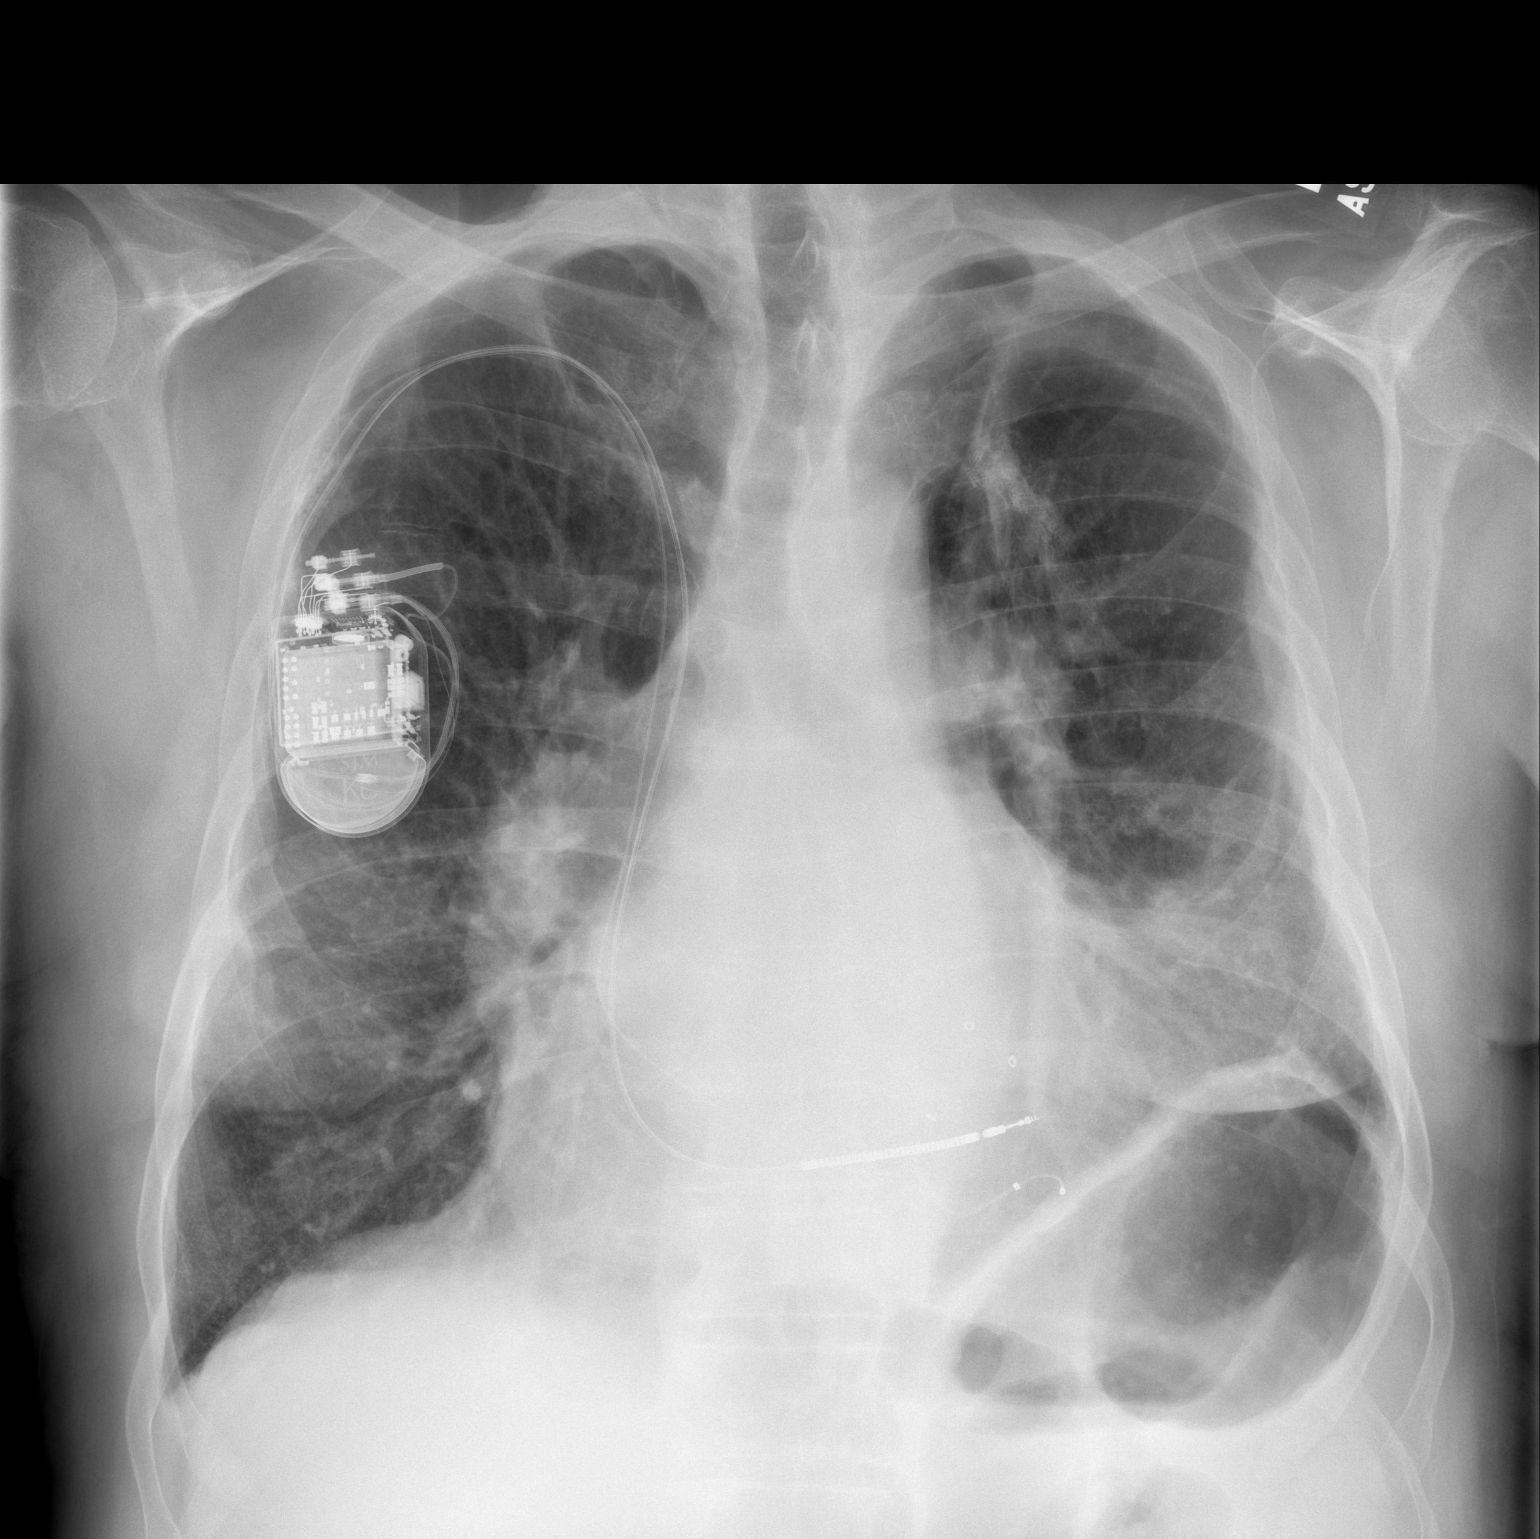

[w chest lat]
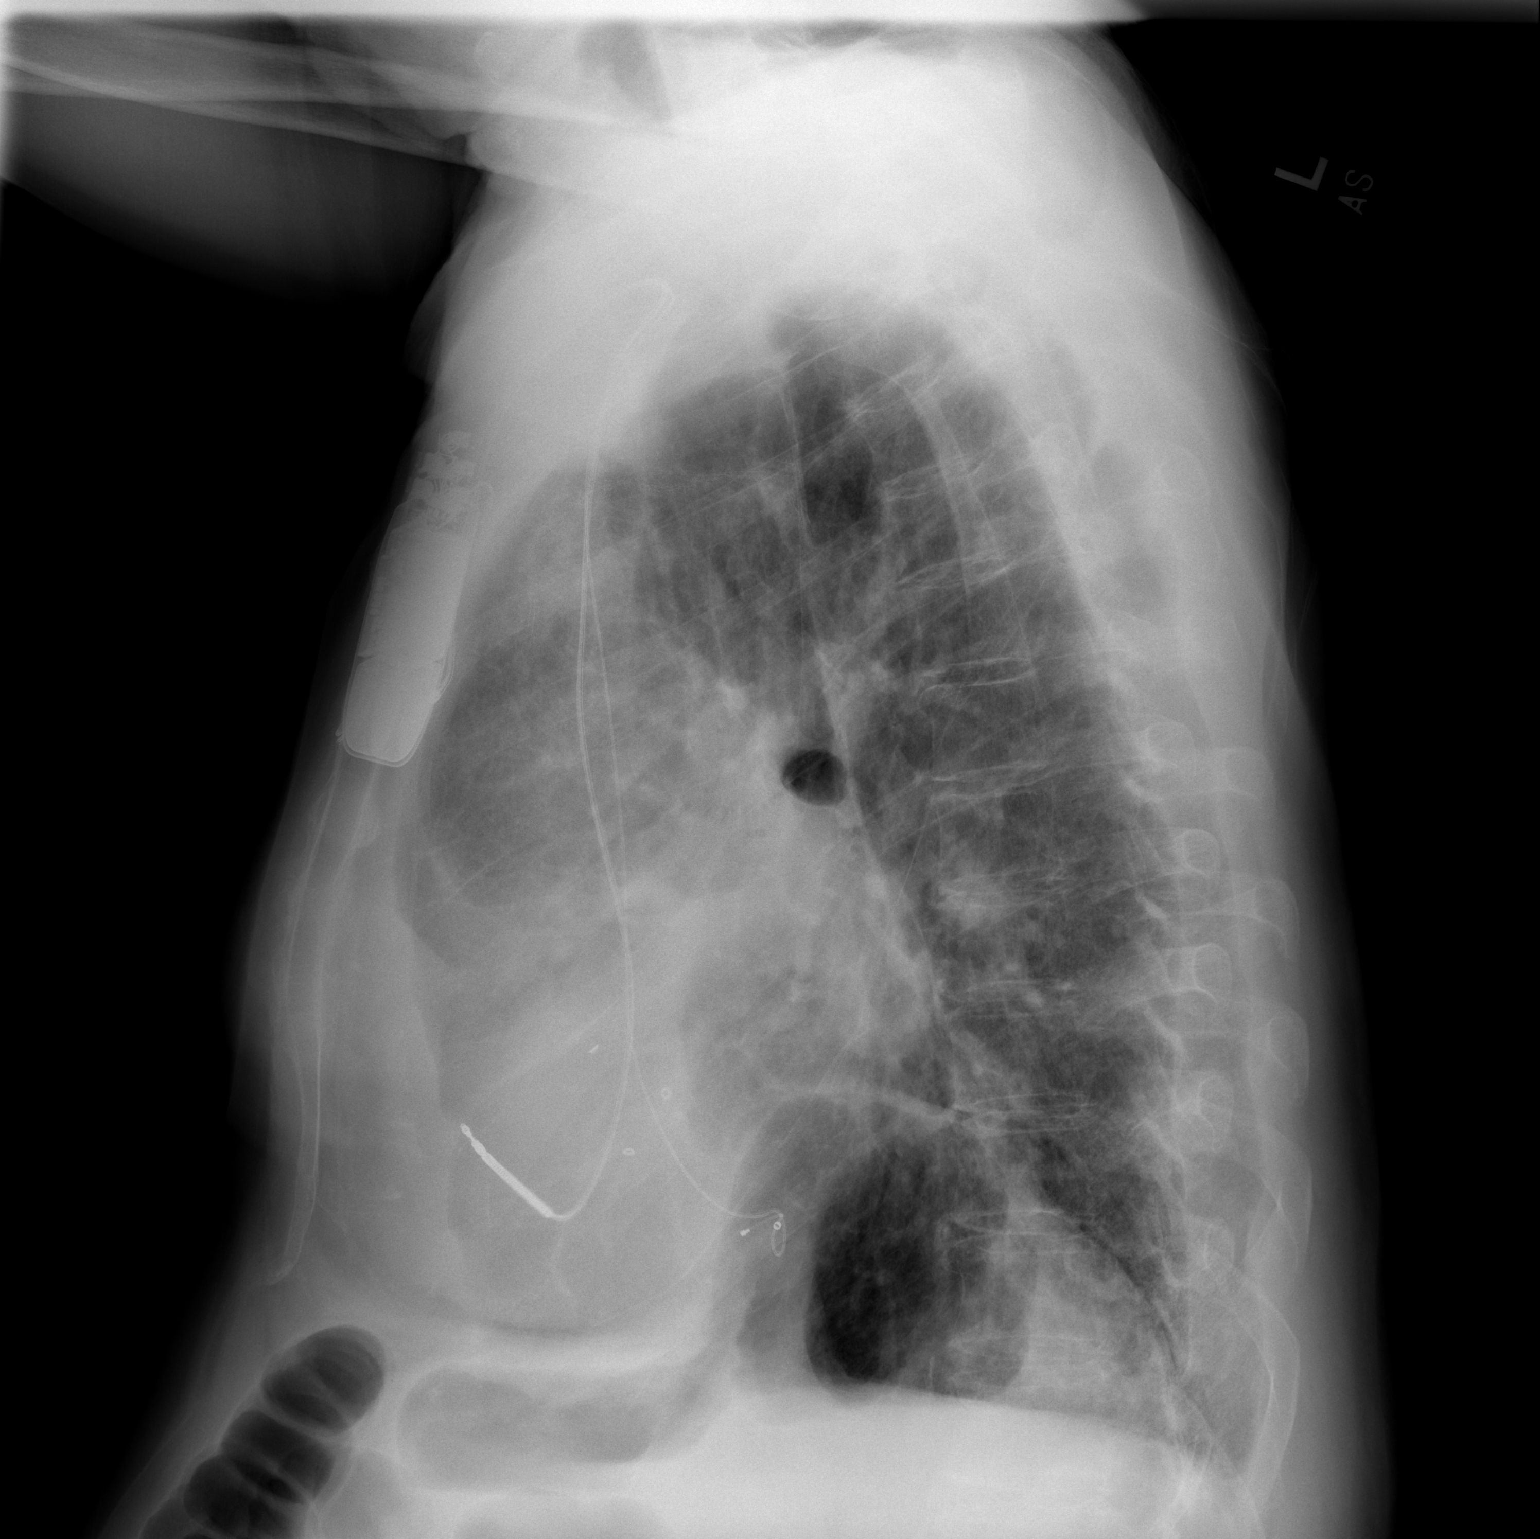

[2 of 2 positions shown; findings below may reference images not displayed]

FINDINGS: Allowing for differences in technique, there is little interval
change compared to the prior exam 12/03/2013. Right subclavian AICD
is present. Bioprosthetic mitral valve replacement is present.
Cardiopericardial silhouette remains enlarged. Lung staples are
present in the left upper lobe.

The left apical pneumothorax the still present with hazy opacity
around the pleural line, suggesting accumulated fluid and loculated
hydro pneumothorax. Stable appearance of the right long, with hilar
fullness.

Chronic eventration of the left hemidiaphragm. This accounts for
opacity over the lower thoracic spine on the lateral view.
IMPRESSION: 1. Left apical pneumothorax, with opacity along the margin
suggesting developing loculated hydro pneumothorax. Adjacent left
upper lobe lung staples. Postsurgical volume loss in the left lung.
2. Cardiomegaly with unchanged pacemaker apparatus and mitral valve
replacement.
3. Compared to prior exam 12/03/2013, little interval change when
accounting for technical differences.

## 2015-08-13 ENCOUNTER — Other Ambulatory Visit: Payer: Self-pay | Admitting: Family Medicine

## 2015-08-25 ENCOUNTER — Ambulatory Visit (INDEPENDENT_AMBULATORY_CARE_PROVIDER_SITE_OTHER): Payer: Medicare Other | Admitting: Cardiology

## 2015-08-25 ENCOUNTER — Encounter: Payer: Self-pay | Admitting: Cardiology

## 2015-08-25 VITALS — BP 102/60 | HR 70 | Ht 74.0 in | Wt 211.8 lb

## 2015-08-25 DIAGNOSIS — I5022 Chronic systolic (congestive) heart failure: Secondary | ICD-10-CM | POA: Diagnosis not present

## 2015-08-25 DIAGNOSIS — Z953 Presence of xenogenic heart valve: Secondary | ICD-10-CM

## 2015-08-25 DIAGNOSIS — Z9889 Other specified postprocedural states: Secondary | ICD-10-CM | POA: Diagnosis not present

## 2015-08-25 DIAGNOSIS — I482 Chronic atrial fibrillation, unspecified: Secondary | ICD-10-CM

## 2015-08-25 DIAGNOSIS — Z95810 Presence of automatic (implantable) cardiac defibrillator: Secondary | ICD-10-CM

## 2015-08-25 NOTE — Patient Instructions (Signed)
Medication Instructions:  Your physician recommends that you continue on your current medications as directed. Please refer to the Current Medication list given to you today.   Labwork: None ordered  Testing/Procedures: None ordered  Follow-Up: Your physician wants you to follow-up in: 6 months with Dr.Skains You will receive a reminder letter in the mail two months in advance. If you don't receive a letter, please call our office to schedule the follow-up appointment.   Any Other Special Instructions Will Be Listed Below (If Applicable).     If you need a refill on your cardiac medications before your next appointment, please call your pharmacy.

## 2015-08-25 NOTE — Progress Notes (Signed)
Cardiology Office Note    Date:  08/25/2015   ID:  [name], DOB Aug 10, 1945, MRN [id number]  PCP:  Charles Peng, NP  Cardiologist:   Charles Furbish, MD (former Dr. Ron [name])    History of Present Illness:  [name] is a 70 y.o. male former Dr. Ron [name] patient here for follow-up of mitral valve disease, bioprosthetic valve in mitral position. as well as atrial fibrillation. He also had AV node ablation, pacemaker. A fibrillation in the past could not be rate controlled. No anticoagulation because of prior CNS bleed. He had prior left atrial appendage occlusion/tied.  He developed lung cancer and is responded to treatments. Dr. Earlie [name]. Continuous oxygen. Resection  ICD in place January 2010 several days after his mitral valve surgery. Most recent ejection fraction was 35% in 2015.   Overall these feeling well without any shortness of breath, no orthopnea, no chest pain, no syncope, no bleeding.    Past Medical History  Diagnosis Date  . Hypertension   . Endocarditis     Bacterial, 2009  . Colon polyps   . Dyslipidemia   . Atrial fibrillation ([place])     AV Node ablation January, 2010, for rapid atrial fib  . Cardiomyopathy     non-ischemic  . Ejection fraction < 50%   . Atrial septal defect     Closed with surgery January, 2010  . Mitral regurgitation     Severe symptomatic primary MR due to bacterial endocarditis, treated w/ MVR  . Intracranial hemorrhage (HCC)     Coumadin cannot be used because of the history of his bleed  . Renal artery stenosis (HCC)     Mild by history  . Pulmonary hypertension (HCC)     Moderate  . HLD (hyperlipidemia)   . Automatic implantable cardioverter-defibrillator in situ     LV dysfunction and pacer needed for AV node lesion  . CVA (cerebral vascular accident) ([place]) 2009    denies residual on 08/14/2013  . CHF (congestive heart failure) ([place])   . Myocardial infarction ([place]) 2010  . Dysrhythmia   . Pneumonia     "this is my first  case" (08/14/2013)  . Spontaneous pneumothorax     right thoracotomy - distant past  . Permanent atrial fibrillation     Originally Coumadin use for atrial fibrillation  //   he had intracerebral hemorrhage with an INR of 2.3 June, 2009. Anticoagulation could no longer be used.  //  Rapid atrial fibrillation after inferior MI October, 2010..........Marland Kitchen AV node ablation done at that time with ICD pacemaker placed (EF 35%).   //   Left atrial appendage tied off at the time of mitral valve surgery January, 2010 (maze pro  . Status post minimally invasive mitral valve replacement with bioprosthetic valve     33 mm Medtronic Mosaic porcine bioprosthesis placed via right mini thoracotomy for bacterial endocarditis complicated by severe MR and CHF   . Prosthetic valve dysfunction     Mild mitral stenosis  . Chronic combined systolic and diastolic CHF (congestive heart failure) ([place])   . Pacemaker   . Diabetes mellitus without complication ([place])   . Headache(784.0)     related to stroke only  . COPD (chronic obstructive pulmonary disease) (HCC)     O2- 2 liters, nasal cannula, q night   . COPD GOLD II 01/11/2007    PFT's 09/25/13  FEV1  2.18 (61%) ratio 64 no change p B2 and DLC0  32% corrects to  68% - trial off advair and acei rec starting  08/23/2013     . Lung cancer ([place]) 11/29/2013    T1N0 Stage Ia non-small cell carcinoma left lung treated with wedge resection    Past Surgical History  Procedure Laterality Date  . Mitral valve replacement Right 07/17/2008    29m Medtronic Mosaic porcine bioprosthesis  . Cardiac defibrillator placement  ~ 2010    St Jude  . Penile prosthesis implant    . Appendectomy    . Mass biopsy Left     neck mass  . Leep    . Cataract extraction w/ intraocular lens  implant, bilateral Bilateral   . Asd repair, secundum  07/17/2008    pericardial patch closure of ASD  . Av node ablation  07/2008    for rapid atrial fib  . Thoracotomy Right 1970's    spontaneous  pneumothorax - while in the [employer] . Tonsillectomy    . Insert / replace / remove pacemaker    . Cardiac valve replacement    . Cardiac catheterization    . Video assisted thoracoscopy (vats)/wedge resection Left 11/29/2013    Procedure: Video assisted thoracoscopy for wedge resection; mini thoracotomy;  Surgeon: CRexene Alberts MD;  [facility]  Service: Thoracic;  Laterality: Left;  . Implantable cardioverter defibrillator (icd) generator change N/A 02/06/2012    Procedure: ICD GENERATOR CHANGE;  Surgeon: GEvans Lance MD;  [facility];  Service: Cardiovascular;  Laterality: N/A;  . Right heart catheterization N/A 08/16/2013    Procedure: RIGHT HEART CATH;  Surgeon: PJosue Hector MD;  [facility];  Service: Cardiovascular;  Laterality: N/A;    Outpatient Prescriptions Prior to Visit  Medication Sig Dispense Refill  . aspirin EC 81 MG tablet Take 1 tablet (81 mg total) by mouth daily. 90 tablet 3  . atorvastatin (LIPITOR) 10 MG tablet TAKE 1 TABLET BY MOUTH EVERY DAY 90 tablet 3  . budesonide-formoterol (SYMBICORT) 160-4.5 MCG/ACT inhaler Inhale 2 puffs into the lungs 2 (two) times daily.    . carvedilol (COREG) 3.125 MG tablet TAKE 1 TABLET BY MOUTH TWICE A DAY WITH MEALS 180 tablet 1  . furosemide (LASIX) 40 MG tablet Take 40 mg by mouth daily. May take 1 extra daily as needed for edema    . irbesartan (AVAPRO) 75 MG tablet TAKE 1 TABLET BY MOUTH EVERY DAY 90 tablet 1  . metFORMIN (GLUCOPHAGE) 1000 MG tablet Take 1 tablet (1,000 mg total) by mouth 2 (two) times daily with a meal. (Patient taking differently: Take 1,000 mg by mouth daily with breakfast. ) 60 tablet 3  . nitroGLYCERIN (NITROSTAT) 0.4 MG SL tablet Place 1 tablet (0.4 mg total) under the tongue every 5 (five) minutes as needed. For chest pain 25 tablet 1  . NON FORMULARY Wears 2L of O2 continuously    . albuterol (PROVENTIL HFA;VENTOLIN HFA) 108 (90 BASE) MCG/ACT inhaler Inhale 2 puffs into the  lungs every 6 (six) hours as needed for wheezing or shortness of breath. (Patient not taking: Reported on 08/25/2015) 1 Inhaler 6  . metFORMIN (GLUCOPHAGE) 500 MG tablet TAKE 1 TABLET IN THE MORNING WITH BREAKFAST AND ONE HALF TABLET WITH YOUR EVENING MEAL (Patient not taking: Reported on 08/25/2015) 150 tablet 1   No facility-administered medications prior to visit.     Allergies:   Anticoagulant compound and Warfarin sodium   Social History   Social History  . Marital Status: Married  Spouse Name: [name]  . Number of Children: 0  . Years of Education: College   Occupational History  . Retired     Tour manager   Social History Main Topics  . Smoking status: Former Smoker -- 1.00 packs/day for 45 years    Types: Cigarettes    Quit date: 08/11/2013  . Smokeless tobacco: Never Used     Comment: 08/14/2013 "quit smoking in 2009"  . Alcohol Use: No     Comment: 08/14/2013 "used to drink beer; quit:in 1982"  . Drug Use: No  . Sexual Activity: Yes   Other Topics Concern  . None   Social History Narrative   Patient lives at home with his spouse.   Caffeine Use: none   Worked for the [employer]   Has two boys and a girl. All live local.      Family History:  The patient's family history includes Stomach cancer in his father; Stroke in his mother.   ROS:   Please see the history of present illness.    ROS  no chest pain, no shortness of breath other than baseline, no bleeding. All other systems reviewed and are negative.   PHYSICAL EXAM:   VS:  BP 102/60 mmHg  Pulse 70  Ht '6\' 2"'$  (1.88 m)  Wt 211 lb 12.8 oz (96.072 kg)  BMI 27.18 kg/m2  SpO2 96%   GEN: Well nourished, well developed, in no acute distress HEENT: normal Neck: no JVD, carotid bruits, or masses Cardiac: RRR; no murmurs, rubs, or gallops,no edema  Respiratory:  clear to auscultation bilaterally, normal work of breathing, home oxygen noted GI: soft, nontender, nondistended, + BS MS: no deformity or  atrophy Skin: warm and dry, no rash Neuro:  Alert and Oriented x 3, Strength and sensation are intact Psych: euthymic mood, full affect  Wt Readings from Last 3 Encounters:  08/25/15 211 lb 12.8 oz (96.072 kg)  06/29/15 210 lb 14.4 oz (95.664 kg)  06/26/15 210 lb 12.8 oz (95.618 kg)      Studies/Labs Reviewed:   EKG:  EKG is not ordered today.    Recent Labs: 06/22/2015: ALT 13; BUN 20.8; Creatinine 1.1; HGB 16.7; Platelets 179; Potassium 4.6; Sodium 141   Lipid Panel    Component Value Date/Time   CHOL 105 07/01/2013 0957   TRIG 125.0 07/01/2013 0957   HDL 40.80 07/01/2013 0957   CHOLHDL 3 07/01/2013 0957   VLDL 25.0 07/01/2013 0957   LDLCALC 39 07/01/2013 0957   LDLDIRECT 80.2 03/31/2011 1411    Additional studies/ records that were reviewed today include:  Prior office notes reviewed, lab work reviewed    ASSESSMENT:    1. Chronic atrial fibrillation ([place])   2. H/O atrioventricular nodal ablation   3. ICD (implantable cardioverter-defibrillator), biventricular, in situ   4. Chronic systolic CHF (congestive heart failure) (HCC)   5. Status post minimally invasive mitral valve replacement with bioprosthetic valve      PLAN:  In order of problems listed above:  1. Chronic atrial fibrillation-Overall doing well. Unfortunately cannot anticoagulate secondary to history of intracranial hemorrhage. Had rapid atrial fibrillation with inferior MI in October 2010 rate control was always an issue therefore he went AV nodal ablation with ICD placement. His atrial appendage was then tied off when he had mitral valve surgery thankfully. 2. Chronic systolic heart failure-ejection fraction 35%. Stable fluid balance. Weight has been stable. Understands fluid restrictions. Good medications. 3. Nose to take antibiotics prior to dental work.  Had previous bacterial endocarditis, gated by severe MR. Very stable. Coronary arteries were normal prior to valve surgery. 2010. 4. Prior  intracranial bleed occurred in 2009 with INR of 2.3. After that point, he was no longer anticoagulated. 5. Has a history of stage IA non-small cell lung carcinoma left wedge resection. Home oxygen.    Medication Adjustments/Labs and Tests Ordered: Current medicines are reviewed at length with the patient today.  Concerns regarding medicines are outlined above.  Medication changes, Labs and Tests ordered today are listed in the Patient Instructions below. There are no Patient Instructions on file for this visit.     Bobby Rumpf, MD  08/25/2015 10:23 AM    [facility] [address], [place], [place]  [address] Phone: [phone]; Fax: [phone]

## 2015-08-28 IMAGING — CR DG CHEST 2V
3 series · 3 of 3 positions shown · non-contrast
Comparison: 12/16/2013

CLINICAL DATA: Chest pain and shortness of breath. Surgery 8 weeks
ago for lung cancer.

EXAM:
CHEST  2 VIEW

[view not recorded (1 of 3)]
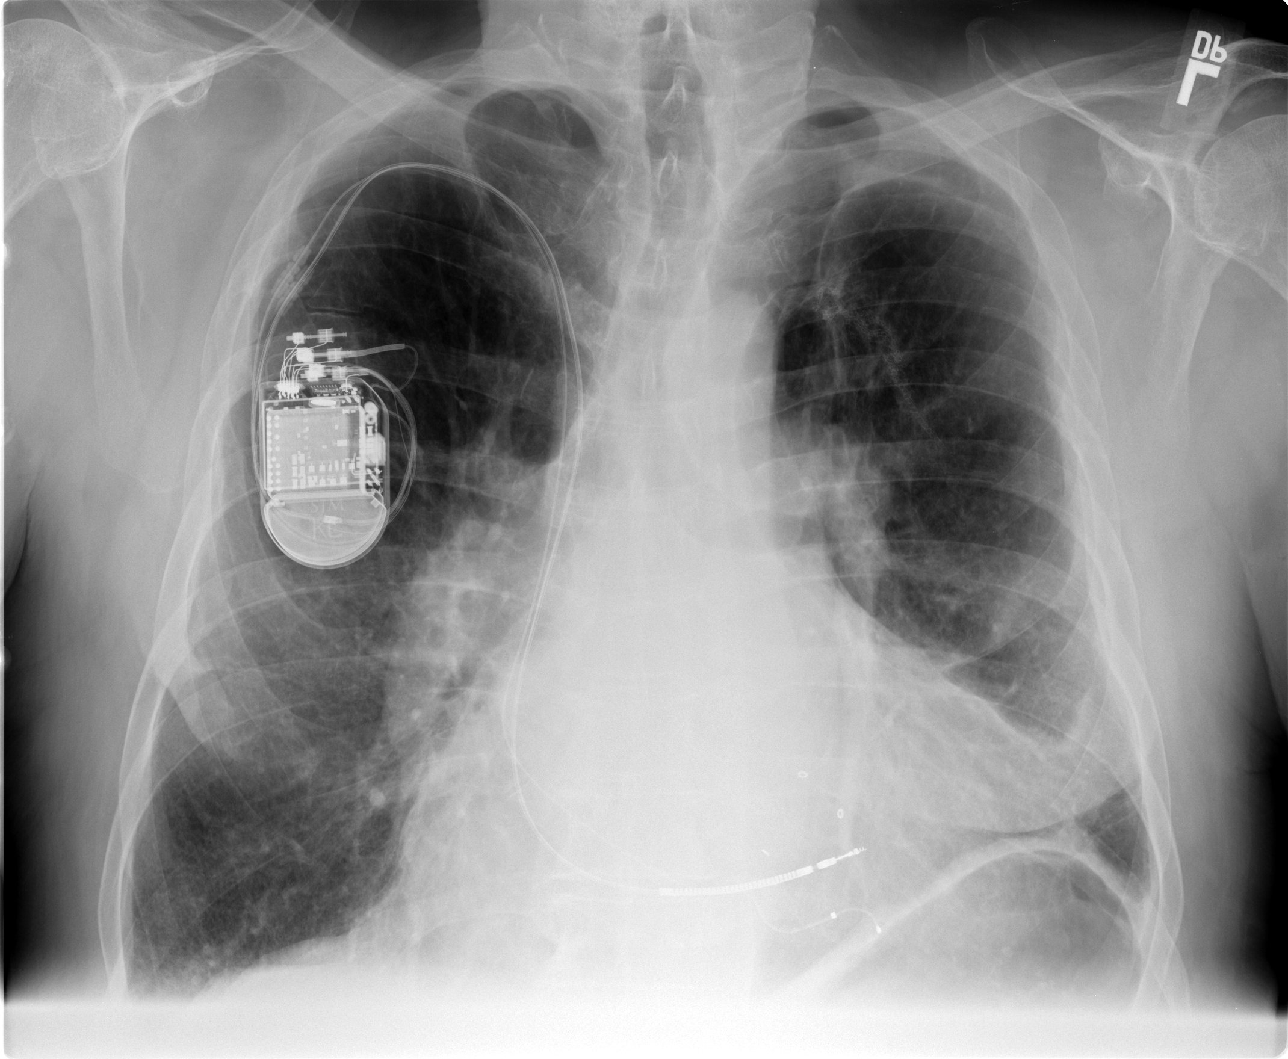

[view not recorded (2 of 3)]
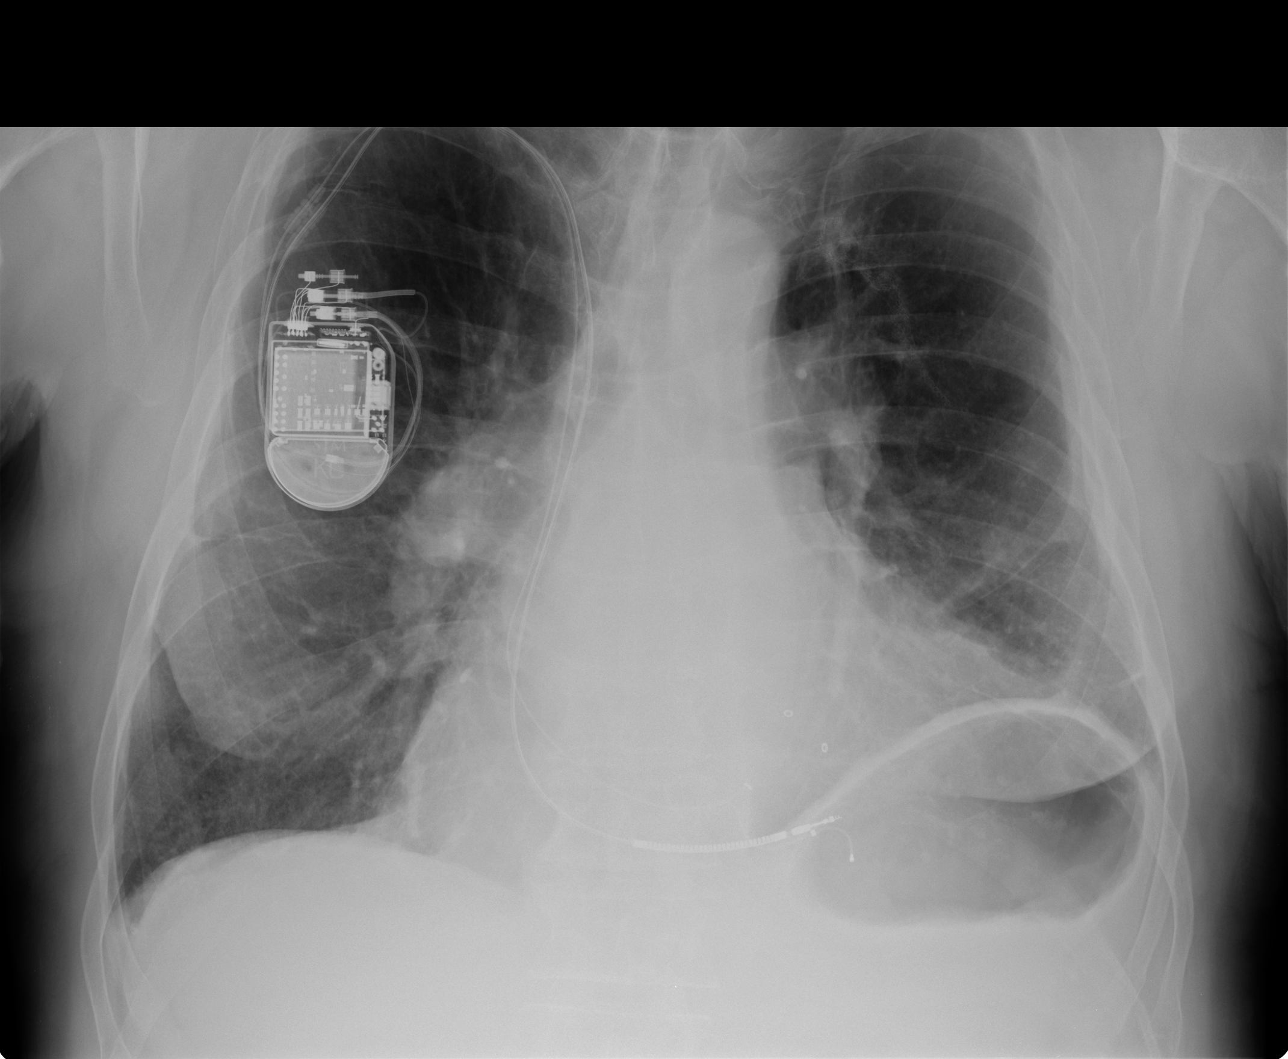

[view not recorded (3 of 3)]
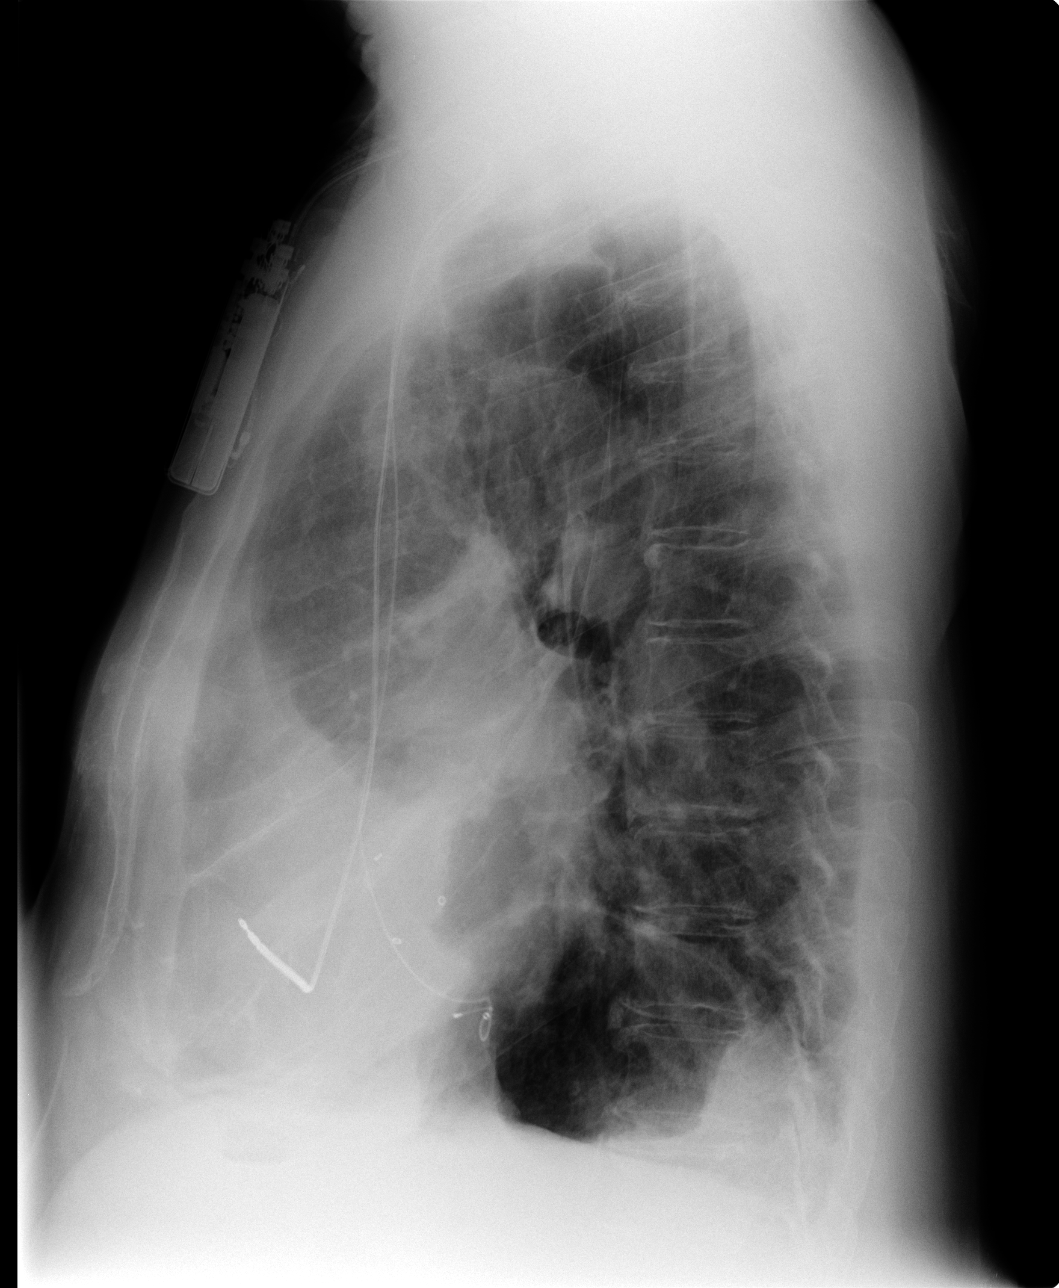

[3 of 3 positions shown; findings below may reference images not displayed]

FINDINGS: Left upper lobe curvilinear scarring is reidentified, with trace
residual left apical pneumothorax reidentified. Right-sided AICD in
place. Moderate enlargement of the cardiac silhouette is
reidentified with hilar prominence. Hyperinflation suggests
emphysema. Linear bilateral lower lobe scarring or atelectasis is
noted. Elevation of the left hemidiaphragm is noted. Trace if any
pleural fluid.
IMPRESSION: Trace residual left apical pneumothorax reidentified without change
otherwise.

## 2015-08-31 ENCOUNTER — Other Ambulatory Visit: Payer: Self-pay

## 2015-09-01 ENCOUNTER — Ambulatory Visit (INDEPENDENT_AMBULATORY_CARE_PROVIDER_SITE_OTHER): Payer: Medicare Other | Admitting: *Deleted

## 2015-09-01 DIAGNOSIS — Z95810 Presence of automatic (implantable) cardiac defibrillator: Secondary | ICD-10-CM | POA: Diagnosis not present

## 2015-09-01 DIAGNOSIS — I428 Other cardiomyopathies: Secondary | ICD-10-CM

## 2015-09-01 DIAGNOSIS — I429 Cardiomyopathy, unspecified: Secondary | ICD-10-CM | POA: Diagnosis not present

## 2015-09-01 DIAGNOSIS — I5042 Chronic combined systolic (congestive) and diastolic (congestive) heart failure: Secondary | ICD-10-CM | POA: Diagnosis not present

## 2015-09-02 ENCOUNTER — Emergency Department (HOSPITAL_COMMUNITY): Payer: Medicare Other

## 2015-09-02 ENCOUNTER — Encounter (HOSPITAL_COMMUNITY): Payer: Self-pay

## 2015-09-02 ENCOUNTER — Emergency Department (HOSPITAL_COMMUNITY)
Admission: EM | Admit: 2015-09-02 | Discharge: 2015-09-02 | Disposition: A | Discharge status: Home / Self Care | Payer: Medicare Other | Attending: Emergency Medicine | Admitting: Emergency Medicine

## 2015-09-02 DIAGNOSIS — Z79899 Other long term (current) drug therapy: Secondary | ICD-10-CM | POA: Diagnosis not present

## 2015-09-02 DIAGNOSIS — Q211 Atrial septal defect: Secondary | ICD-10-CM | POA: Diagnosis not present

## 2015-09-02 DIAGNOSIS — Z7951 Long term (current) use of inhaled steroids: Secondary | ICD-10-CM | POA: Diagnosis not present

## 2015-09-02 DIAGNOSIS — S79911A Unspecified injury of right hip, initial encounter: Secondary | ICD-10-CM | POA: Diagnosis present

## 2015-09-02 DIAGNOSIS — Z7982 Long term (current) use of aspirin: Secondary | ICD-10-CM | POA: Diagnosis not present

## 2015-09-02 DIAGNOSIS — Z85118 Personal history of other malignant neoplasm of bronchus and lung: Secondary | ICD-10-CM | POA: Insufficient documentation

## 2015-09-02 DIAGNOSIS — I4891 Unspecified atrial fibrillation: Secondary | ICD-10-CM | POA: Diagnosis not present

## 2015-09-02 DIAGNOSIS — I5042 Chronic combined systolic (congestive) and diastolic (congestive) heart failure: Secondary | ICD-10-CM | POA: Diagnosis not present

## 2015-09-02 DIAGNOSIS — Y998 Other external cause status: Secondary | ICD-10-CM | POA: Insufficient documentation

## 2015-09-02 DIAGNOSIS — Z8673 Personal history of transient ischemic attack (TIA), and cerebral infarction without residual deficits: Secondary | ICD-10-CM | POA: Diagnosis not present

## 2015-09-02 DIAGNOSIS — Z86010 Personal history of colonic polyps: Secondary | ICD-10-CM | POA: Diagnosis not present

## 2015-09-02 DIAGNOSIS — I10 Essential (primary) hypertension: Secondary | ICD-10-CM | POA: Diagnosis not present

## 2015-09-02 DIAGNOSIS — Y9289 Other specified places as the place of occurrence of the external cause: Secondary | ICD-10-CM | POA: Insufficient documentation

## 2015-09-02 DIAGNOSIS — I252 Old myocardial infarction: Secondary | ICD-10-CM | POA: Diagnosis not present

## 2015-09-02 DIAGNOSIS — Z7984 Long term (current) use of oral hypoglycemic drugs: Secondary | ICD-10-CM | POA: Insufficient documentation

## 2015-09-02 DIAGNOSIS — Y9389 Activity, other specified: Secondary | ICD-10-CM | POA: Diagnosis not present

## 2015-09-02 DIAGNOSIS — X58XXXA Exposure to other specified factors, initial encounter: Secondary | ICD-10-CM | POA: Diagnosis not present

## 2015-09-02 DIAGNOSIS — J449 Chronic obstructive pulmonary disease, unspecified: Secondary | ICD-10-CM | POA: Diagnosis not present

## 2015-09-02 DIAGNOSIS — M25551 Pain in right hip: Secondary | ICD-10-CM | POA: Diagnosis not present

## 2015-09-02 DIAGNOSIS — E785 Hyperlipidemia, unspecified: Secondary | ICD-10-CM | POA: Diagnosis not present

## 2015-09-02 DIAGNOSIS — M545 Low back pain: Secondary | ICD-10-CM | POA: Diagnosis not present

## 2015-09-02 DIAGNOSIS — M5431 Sciatica, right side: Secondary | ICD-10-CM | POA: Insufficient documentation

## 2015-09-02 DIAGNOSIS — M79606 Pain in leg, unspecified: Secondary | ICD-10-CM | POA: Diagnosis not present

## 2015-09-02 DIAGNOSIS — E119 Type 2 diabetes mellitus without complications: Secondary | ICD-10-CM | POA: Diagnosis not present

## 2015-09-02 DIAGNOSIS — Z950 Presence of cardiac pacemaker: Secondary | ICD-10-CM | POA: Insufficient documentation

## 2015-09-02 DIAGNOSIS — Z8701 Personal history of pneumonia (recurrent): Secondary | ICD-10-CM | POA: Diagnosis not present

## 2015-09-02 DIAGNOSIS — Z9889 Other specified postprocedural states: Secondary | ICD-10-CM | POA: Insufficient documentation

## 2015-09-02 MED ORDER — IBUPROFEN 200 MG PO TABS: 600.0000 mg | ORAL_TABLET | Freq: Three times a day (TID) | ORAL | Status: DC

## 2015-09-02 MED ORDER — KETOROLAC TROMETHAMINE 30 MG/ML IJ SOLN
30.0000 mg | Freq: Once | INTRAMUSCULAR | Status: AC
  Administered 2015-09-02: 30 mg via INTRAMUSCULAR
  Filled 2015-09-02: qty 1

## 2015-09-02 MED ORDER — IBUPROFEN 600 MG PO TABS: 600.0000 mg | ORAL_TABLET | Freq: Three times a day (TID) | ORAL | Status: DC

## 2015-09-02 MED ORDER — HYDROCODONE-ACETAMINOPHEN 5-325 MG PO TABS: 2.0000 | ORAL_TABLET | ORAL | Status: DC | PRN

## 2015-09-02 NOTE — ED Notes (Signed)
Per [facility]- Pt bent over to pick up tennis ball and felt a pop to right hip. Pain to right hip and right leg. No pain medication taken. Denis numbness and tingling.

## 2015-09-02 NOTE — Discharge Instructions (Signed)
Radicular Pain Radicular pain in either the arm or leg is usually from a bulging or herniated disk in the spine. A piece of the herniated disk may press against the nerves as the nerves exit the spine. This causes pain which is felt at the tips of the nerves down the arm or leg. Other causes of radicular pain may include:  Fractures.  Heart disease.  Cancer.  An abnormal and usually degenerative state of the nervous system or nerves (neuropathy). Diagnosis may require CT or MRI scanning to determine the primary cause.  Nerves that start at the neck (nerve roots) may cause radicular pain in the outer shoulder and arm. It can spread down to the thumb and fingers. The symptoms vary depending on which nerve root has been affected. In most cases radicular pain improves with conservative treatment. Neck problems may require physical therapy, a neck collar, or cervical traction. Treatment may take many weeks, and surgery may be considered if the symptoms do not improve.  Conservative treatment is also recommended for sciatica. Sciatica causes pain to radiate from the lower back or buttock area down the leg into the foot. Often there is a history of back problems. Most patients with sciatica are better after 2 to 4 weeks of rest and other supportive care. Short term bed rest can reduce the disk pressure considerably. Sitting, however, is not a good position since this increases the pressure on the disk. You should avoid bending, lifting, and all other activities which make the problem worse. Traction can be used in severe cases. Surgery is usually reserved for patients who do not improve within the first months of treatment. Only take over-the-counter or prescription medicines for pain, discomfort, or fever as directed by your caregiver. Narcotics and muscle relaxants may help by relieving more severe pain and spasm and by providing mild sedation. Cold or massage can give significant relief. Spinal manipulation  is not recommended. It can increase the degree of disc protrusion. Epidural steroid injections are often effective treatment for radicular pain. These injections deliver medicine to the spinal nerve in the space between the protective covering of the spinal cord and back bones (vertebrae). Your caregiver can give you more information about steroid injections. These injections are most effective when given within two weeks of the onset of pain.  You should see your caregiver for follow up care as recommended. A program for neck and back injury rehabilitation with stretching and strengthening exercises is an important part of management.  SEEK IMMEDIATE MEDICAL CARE IF:  You develop increased pain, weakness, or numbness in your arm or leg.  You develop difficulty with bladder or bowel control.  You develop abdominal pain.   This information is not intended to replace advice given to you by your health care provider. Make sure you discuss any questions you have with your health care provider.   Document Released: 08/04/2004 Document Revised: 07/18/2014 Document Reviewed: 01/21/2015 Elsevier Interactive Patient Education 2016 [place] The sciatic nerve runs from the back down the leg and is responsible for sensation and control of the muscles in the back (posterior) side of the thigh, lower leg, and foot. Sciatica is a condition that is characterized by inflammation of this nerve.  SYMPTOMS   Signs of nerve damage, including numbness and/or weakness along the posterior side of the lower extremity.  Pain in the back of the thigh that may also travel down the leg.  Pain that worsens when sitting for  long periods of time.  Occasionally, pain in the back or buttock. CAUSES  Inflammation of the sciatic nerve is the cause of sciatica. The inflammation is due to something irritating the nerve. Common sources of irritation include:  Sitting for long periods of  time.  Direct trauma to the nerve.  Arthritis of the spine.  Herniated or ruptured disk.  Slipping of the vertebrae (spondylolisthesis).  Pressure from soft tissues, such as muscles or ligament-like tissue (fascia). RISK INCREASES WITH:  Sports that place pressure or stress on the spine (football or weightlifting).  Poor strength and flexibility.  Failure to warm up properly before activity.  Family history of low back pain or disk disorders.  Previous back injury or surgery.  Poor body mechanics, especially when lifting, or poor posture. PREVENTION   Warm up and stretch properly before activity.  Maintain physical fitness:  Strength, flexibility, and endurance.  Cardiovascular fitness.  Learn and use proper technique, especially with posture and lifting. When possible, have coach correct improper technique.  Avoid activities that place stress on the spine. PROGNOSIS If treated properly, then sciatica usually resolves within 6 weeks. However, occasionally surgery is necessary.  RELATED COMPLICATIONS   Permanent nerve damage, including pain, numbness, tingle, or weakness.  Chronic back pain.  Risks of surgery: infection, bleeding, nerve damage, or damage to surrounding tissues. TREATMENT Treatment initially involves resting from any activities that aggravate your symptoms. The use of ice and medication may help reduce pain and inflammation. The use of strengthening and stretching exercises may help reduce pain with activity. These exercises may be performed at home or with referral to a therapist. A therapist may recommend further treatments, such as transcutaneous electronic nerve stimulation (TENS) or ultrasound. Your caregiver may recommend corticosteroid injections to help reduce inflammation of the sciatic nerve. If symptoms persist despite non-surgical (conservative) treatment, then surgery may be recommended. MEDICATION  If pain medication is necessary, then  nonsteroidal anti-inflammatory medications, such as aspirin and ibuprofen, or other minor pain relievers, such as acetaminophen, are often recommended.  Do not take pain medication for 7 days before surgery.  Prescription pain relievers may be given if deemed necessary by your caregiver. Use only as directed and only as much as you need.  Ointments applied to the skin may be helpful.  Corticosteroid injections may be given by your caregiver. These injections should be reserved for the most serious cases, because they may only be given a certain number of times. HEAT AND COLD  Cold treatment (icing) relieves pain and reduces inflammation. Cold treatment should be applied for 10 to 15 minutes every 2 to 3 hours for inflammation and pain and immediately after any activity that aggravates your symptoms. Use ice packs or massage the area with a piece of ice (ice massage).  Heat treatment may be used prior to performing the stretching and strengthening activities prescribed by your caregiver, physical therapist, or athletic trainer. Use a heat pack or soak the injury in warm water. SEEK MEDICAL CARE IF:  Treatment seems to offer no benefit, or the condition worsens.  Any medications produce adverse side effects. EXERCISES  RANGE OF MOTION (ROM) AND STRETCHING EXERCISES - Sciatica Most people with sciatic will find that their symptoms worsen with either excessive bending forward (flexion) or arching at the low back (extension). The exercises which will help resolve your symptoms will focus on the opposite motion. Your physician, physical therapist or athletic trainer will help you determine which exercises will be most helpful to  resolve your low back pain. Do not complete any exercises without first consulting with your clinician. Discontinue any exercises which worsen your symptoms until you speak to your clinician. If you have pain, numbness or tingling which travels down into your buttocks, leg or  foot, the goal of the therapy is for these symptoms to move closer to your back and eventually resolve. Occasionally, these leg symptoms will get better, but your low back pain may worsen; this is typically an indication of progress in your rehabilitation. Be certain to be very alert to any changes in your symptoms and the activities in which you participated in the 24 hours prior to the change. Sharing this information with your clinician will allow him/her to most efficiently treat your condition. These exercises may help you when beginning to rehabilitate your injury. Your symptoms may resolve with or without further involvement from your physician, physical therapist or athletic trainer. While completing these exercises, remember:   Restoring tissue flexibility helps normal motion to return to the joints. This allows healthier, less painful movement and activity.  An effective stretch should be held for at least 30 seconds.  A stretch should never be painful. You should only feel a gentle lengthening or release in the stretched tissue. FLEXION RANGE OF MOTION AND STRETCHING EXERCISES: STRETCH - Flexion, Single Knee to Chest   Lie on a firm bed or floor with both legs extended in front of you.  Keeping one leg in contact with the floor, bring your opposite knee to your chest. Hold your leg in place by either grabbing behind your thigh or at your knee.  Pull until you feel a gentle stretch in your low back. Hold __________ seconds.  Slowly release your grasp and repeat the exercise with the opposite side. Repeat __________ times. Complete this exercise __________ times per day.  STRETCH - Flexion, Double Knee to Chest  Lie on a firm bed or floor with both legs extended in front of you.  Keeping one leg in contact with the floor, bring your opposite knee to your chest.  Tense your stomach muscles to support your back and then lift your other knee to your chest. Hold your legs in place by  either grabbing behind your thighs or at your knees.  Pull both knees toward your chest until you feel a gentle stretch in your low back. Hold __________ seconds.  Tense your stomach muscles and slowly return one leg at a time to the floor. Repeat __________ times. Complete this exercise __________ times per day.  STRETCH - Low Trunk Rotation   Lie on a firm bed or floor. Keeping your legs in front of you, bend your knees so they are both pointed toward the ceiling and your feet are flat on the floor.  Extend your arms out to the side. This will stabilize your upper body by keeping your shoulders in contact with the floor.  Gently and slowly drop both knees together to one side until you feel a gentle stretch in your low back. Hold for __________ seconds.  Tense your stomach muscles to support your low back as you bring your knees back to the starting position. Repeat the exercise to the other side. Repeat __________ times. Complete this exercise __________ times per day  EXTENSION RANGE OF MOTION AND FLEXIBILITY EXERCISES: STRETCH - Extension, Prone on Elbows  Lie on your stomach on the floor, a bed will be too soft. Place your palms about shoulder width apart and at the  height of your head.  Place your elbows under your shoulders. If this is too painful, stack pillows under your chest.  Allow your body to relax so that your hips drop lower and make contact more completely with the floor.  Hold this position for __________ seconds.  Slowly return to lying flat on the floor. Repeat __________ times. Complete this exercise __________ times per day.  RANGE OF MOTION - Extension, Prone Press Ups  Lie on your stomach on the floor, a bed will be too soft. Place your palms about shoulder width apart and at the height of your head.  Keeping your back as relaxed as possible, slowly straighten your elbows while keeping your hips on the floor. You may adjust the placement of your hands to  maximize your comfort. As you gain motion, your hands will come more underneath your shoulders.  Hold this position __________ seconds.  Slowly return to lying flat on the floor. Repeat __________ times. Complete this exercise __________ times per day.  STRENGTHENING EXERCISES - Sciatica  These exercises may help you when beginning to rehabilitate your injury. These exercises should be done near your "sweet spot." This is the neutral, low-back arch, somewhere between fully rounded and fully arched, that is your least painful position. When performed in this safe range of motion, these exercises can be used for people who have either a flexion or extension based injury. These exercises may resolve your symptoms with or without further involvement from your physician, physical therapist or athletic trainer. While completing these exercises, remember:   Muscles can gain both the endurance and the strength needed for everyday activities through controlled exercises.  Complete these exercises as instructed by your physician, physical therapist or athletic trainer. Progress with the resistance and repetition exercises only as your caregiver advises.  You may experience muscle soreness or fatigue, but the pain or discomfort you are trying to eliminate should never worsen during these exercises. If this pain does worsen, stop and make certain you are following the directions exactly. If the pain is still present after adjustments, discontinue the exercise until you can discuss the trouble with your clinician. STRENGTHENING - Deep Abdominals, Pelvic Tilt   Lie on a firm bed or floor. Keeping your legs in front of you, bend your knees so they are both pointed toward the ceiling and your feet are flat on the floor.  Tense your lower abdominal muscles to press your low back into the floor. This motion will rotate your pelvis so that your tail bone is scooping upwards rather than pointing at your feet or into  the floor.  With a gentle tension and even breathing, hold this position for __________ seconds. Repeat __________ times. Complete this exercise __________ times per day.  STRENGTHENING - Abdominals, Crunches   Lie on a firm bed or floor. Keeping your legs in front of you, bend your knees so they are both pointed toward the ceiling and your feet are flat on the floor. Cross your arms over your chest.  Slightly tip your chin down without bending your neck.  Tense your abdominals and slowly lift your trunk high enough to just clear your shoulder blades. Lifting higher can put excessive stress on the low back and does not further strengthen your abdominal muscles.  Control your return to the starting position. Repeat __________ times. Complete this exercise __________ times per day.  STRENGTHENING - Quadruped, Opposite UE/LE Lift  Assume a hands and knees position on a firm surface.  Keep your hands under your shoulders and your knees under your hips. You may place padding under your knees for comfort.  Find your neutral spine and gently tense your abdominal muscles so that you can maintain this position. Your shoulders and hips should form a rectangle that is parallel with the floor and is not twisted.  Keeping your trunk steady, lift your right hand no higher than your shoulder and then your left leg no higher than your hip. Make sure you are not holding your breath. Hold this position __________ seconds.  Continuing to keep your abdominal muscles tense and your back steady, slowly return to your starting position. Repeat with the opposite arm and leg. Repeat __________ times. Complete this exercise __________ times per day.  STRENGTHENING - Abdominals and Quadriceps, Straight Leg Raise   Lie on a firm bed or floor with both legs extended in front of you.  Keeping one leg in contact with the floor, bend the other knee so that your foot can rest flat on the floor.  Find your neutral spine,  and tense your abdominal muscles to maintain your spinal position throughout the exercise.  Slowly lift your straight leg off the floor about 6 inches for a count of 15, making sure to not hold your breath.  Still keeping your neutral spine, slowly lower your leg all the way to the floor. Repeat this exercise with each leg __________ times. Complete this exercise __________ times per day. POSTURE AND BODY MECHANICS CONSIDERATIONS - Sciatica Keeping correct posture when sitting, standing or completing your activities will reduce the stress put on different body tissues, allowing injured tissues a chance to heal and limiting painful experiences. The following are general guidelines for improved posture. Your physician or physical therapist will provide you with any instructions specific to your needs. While reading these guidelines, remember:  The exercises prescribed by your provider will help you have the flexibility and strength to maintain correct postures.  The correct posture provides the optimal environment for your joints to work. All of your joints have less wear and tear when properly supported by a spine with good posture. This means you will experience a healthier, less painful body.  Correct posture must be practiced with all of your activities, especially prolonged sitting and standing. Correct posture is as important when doing repetitive low-stress activities (typing) as it is when doing a single heavy-load activity (lifting). RESTING POSITIONS Consider which positions are most painful for you when choosing a resting position. If you have pain with flexion-based activities (sitting, bending, stooping, squatting), choose a position that allows you to rest in a less flexed posture. You would want to avoid curling into a fetal position on your side. If your pain worsens with extension-based activities (prolonged standing, working overhead), avoid resting in an extended position such as  sleeping on your stomach. Most people will find more comfort when they rest with their spine in a more neutral position, neither too rounded nor too arched. Lying on a non-sagging bed on your side with a pillow between your knees, or on your back with a pillow under your knees will often provide some relief. Keep in mind, being in any one position for a prolonged period of time, no matter how correct your posture, can still lead to stiffness. PROPER SITTING POSTURE In order to minimize stress and discomfort on your spine, you must sit with correct posture Sitting with good posture should be effortless for a healthy body. Returning to good  posture is a gradual process. Many people can work toward this most comfortably by using various supports until they have the flexibility and strength to maintain this posture on their own. When sitting with proper posture, your ears will fall over your shoulders and your shoulders will fall over your hips. You should use the back of the chair to support your upper back. Your low back will be in a neutral position, just slightly arched. You may place a small pillow or folded towel at the base of your low back for support.  When working at a desk, create an environment that supports good, upright posture. Without extra support, muscles fatigue and lead to excessive strain on joints and other tissues. Keep these recommendations in mind: CHAIR:   A chair should be able to slide under your desk when your back makes contact with the back of the chair. This allows you to work closely.  The chair's height should allow your eyes to be level with the upper part of your monitor and your hands to be slightly lower than your elbows. BODY POSITION  Your feet should make contact with the floor. If this is not possible, use a foot rest.  Keep your ears over your shoulders. This will reduce stress on your neck and low back. INCORRECT SITTING POSTURES   If you are feeling tired and  unable to assume a healthy sitting posture, do not slouch or slump. This puts excessive strain on your back tissues, causing more damage and pain. Healthier options include:  Using more support, like a lumbar pillow.  Switching tasks to something that requires you to be upright or walking.  Talking a brief walk.  Lying down to rest in a neutral-spine position. PROLONGED STANDING WHILE SLIGHTLY LEANING FORWARD  When completing a task that requires you to lean forward while standing in one place for a long time, place either foot up on a stationary 2-4 inch high object to help maintain the best posture. When both feet are on the ground, the low back tends to lose its slight inward curve. If this curve flattens (or becomes too large), then the back and your other joints will experience too much stress, fatigue more quickly and can cause pain.  CORRECT STANDING POSTURES Proper standing posture should be assumed with all daily activities, even if they only take a few moments, like when brushing your teeth. As in sitting, your ears should fall over your shoulders and your shoulders should fall over your hips. You should keep a slight tension in your abdominal muscles to brace your spine. Your tailbone should point down to the ground, not behind your body, resulting in an over-extended swayback posture.  INCORRECT STANDING POSTURES  Common incorrect standing postures include a forward head, locked knees and/or an excessive swayback. WALKING Walk with an upright posture. Your ears, shoulders and hips should all line-up. PROLONGED ACTIVITY IN A FLEXED POSITION When completing a task that requires you to bend forward at your waist or lean over a low surface, try to find a way to stabilize 3 of 4 of your limbs. You can place a hand or elbow on your thigh or rest a knee on the surface you are reaching across. This will provide you more stability so that your muscles do not fatigue as quickly. By keeping your  knees relaxed, or slightly bent, you will also reduce stress across your low back. CORRECT LIFTING TECHNIQUES DO :   Assume a wide stance. This will  provide you more stability and the opportunity to get as close as possible to the object which you are lifting.  Tense your abdominals to brace your spine; then bend at the knees and hips. Keeping your back locked in a neutral-spine position, lift using your leg muscles. Lift with your legs, keeping your back straight.  Test the weight of unknown objects before attempting to lift them.  Try to keep your elbows locked down at your sides in order get the best strength from your shoulders when carrying an object.  Always ask for help when lifting heavy or awkward objects. INCORRECT LIFTING TECHNIQUES DO NOT:   Lock your knees when lifting, even if it is a small object.  Bend and twist. Pivot at your feet or move your feet when needing to change directions.  Assume that you cannot safely pick up a paperclip without proper posture.   This information is not intended to replace advice given to you by your health care provider. Make sure you discuss any questions you have with your health care provider.   Document Released: 06/27/2005 Document Revised: 11/11/2014 Document Reviewed: 10/09/2008 Elsevier Interactive Patient Education [employer].

## 2015-09-02 NOTE — ED Notes (Signed)
Bed: RF54 Expected date:  Expected time:  Means of arrival:  Comments: EMS/48M/R hip/leg pain/non-ambulatory

## 2015-09-02 NOTE — Progress Notes (Signed)
Remote ICD transmission.   

## 2015-09-02 NOTE — ED Notes (Signed)
MD at bedside. 

## 2015-09-02 NOTE — ED Notes (Signed)
Pt and wife ready to go

## 2015-09-02 NOTE — ED Provider Notes (Signed)
CSN: [id number]     Arrival date & time 09/02/15  1349 History   First MD Initiated Contact with Patient 09/02/15 1401     Chief Complaint  Patient presents with  . Hip Pain      HPI Patient states he was sitting on the commode and bent over on Monday when he felt pop and is low back on the right side.  Since that time he said pain and numbness going down his buttocks and into the right lower extremity.  Pain is worse when he walks bends or stoops.  Patient has previous history of lung cancer and had surgery for it approximately 2 years ago.  He had negative x-rays done in December and is followed twice a year with cancer Center for that area Past Medical History  Diagnosis Date  . Hypertension   . Endocarditis     Bacterial, 2009  . Colon polyps   . Dyslipidemia   . Atrial fibrillation ([place])     AV Node ablation January, 2010, for rapid atrial fib  . Cardiomyopathy     non-ischemic  . Ejection fraction < 50%   . Atrial septal defect     Closed with surgery January, 2010  . Mitral regurgitation     Severe symptomatic primary MR due to bacterial endocarditis, treated w/ MVR  . Intracranial hemorrhage (HCC)     Coumadin cannot be used because of the history of his bleed  . Renal artery stenosis (HCC)     Mild by history  . Pulmonary hypertension (HCC)     Moderate  . HLD (hyperlipidemia)   . Automatic implantable cardioverter-defibrillator in situ     LV dysfunction and pacer needed for AV node lesion  . CVA (cerebral vascular accident) ([place]) 2009    denies residual on 08/14/2013  . CHF (congestive heart failure) ([place])   . Myocardial infarction ([place]) 2010  . Dysrhythmia   . Pneumonia     "this is my first case" (08/14/2013)  . Spontaneous pneumothorax     right thoracotomy - distant past  . Permanent atrial fibrillation     Originally Coumadin use for atrial fibrillation  //   he had intracerebral hemorrhage with an INR of 2.3 June, 2009. Anticoagulation could no longer be  used.  //  Rapid atrial fibrillation after inferior MI October, 2010..........Marland Kitchen AV node ablation done at that time with ICD pacemaker placed (EF 35%).   //   Left atrial appendage tied off at the time of mitral valve surgery January, 2010 (maze pro  . Status post minimally invasive mitral valve replacement with bioprosthetic valve     33 mm Medtronic Mosaic porcine bioprosthesis placed via right mini thoracotomy for bacterial endocarditis complicated by severe MR and CHF   . Prosthetic valve dysfunction     Mild mitral stenosis  . Chronic combined systolic and diastolic CHF (congestive heart failure) ([place])   . Pacemaker   . Diabetes mellitus without complication ([place])   . Headache(784.0)     related to stroke only  . COPD (chronic obstructive pulmonary disease) (HCC)     O2- 2 liters, nasal cannula, q night   . COPD GOLD II 01/11/2007    PFT's 09/25/13  FEV1  2.18 (61%) ratio 64 no change p B2 and DLC0  32% corrects to 68% - trial off advair and acei rec starting  08/23/2013     . Lung cancer ([place]) 11/29/2013    T1N0 Stage Ia non-small cell carcinoma  left lung treated with wedge resection   Past Surgical History  Procedure Laterality Date  . Mitral valve replacement Right 07/17/2008    31m Medtronic Mosaic porcine bioprosthesis  . Cardiac defibrillator placement  ~ 2010    St Jude  . Penile prosthesis implant    . Appendectomy    . Mass biopsy Left     neck mass  . Leep    . Cataract extraction w/ intraocular lens  implant, bilateral Bilateral   . Asd repair, secundum  07/17/2008    pericardial patch closure of ASD  . Av node ablation  07/2008    for rapid atrial fib  . Thoracotomy Right 1970's    spontaneous pneumothorax - while in the [employer] . Tonsillectomy    . Insert / replace / remove pacemaker    . Cardiac valve replacement    . Cardiac catheterization    . Video assisted thoracoscopy (vats)/wedge resection Left 11/29/2013    Procedure: Video assisted thoracoscopy for wedge  resection; mini thoracotomy;  Surgeon: CRexene Alberts MD;  [facility]  Service: Thoracic;  Laterality: Left;  . Implantable cardioverter defibrillator (icd) generator change N/A 02/06/2012    Procedure: ICD GENERATOR CHANGE;  Surgeon: GEvans Lance MD;  [facility];  Service: Cardiovascular;  Laterality: N/A;  . Right heart catheterization N/A 08/16/2013    Procedure: RIGHT HEART CATH;  Surgeon: PJosue Hector MD;  [facility];  Service: Cardiovascular;  Laterality: N/A;   Family History  Problem Relation Age of Onset  . Stomach cancer Father   . Stroke Mother    Social History  Substance Use Topics  . Smoking status: Former Smoker -- 1.00 packs/day for 45 years    Types: Cigarettes    Quit date: 08/11/2013  . Smokeless tobacco: Never Used     Comment: 08/14/2013 "quit smoking in 2009"  . Alcohol Use: No     Comment: 08/14/2013 "used to drink beer; quit:in 1982"    Review of Systems  Neurological: Positive for numbness.  All other systems reviewed and are negative.     Allergies  Anticoagulant compound and Warfarin sodium  Home Medications   Prior to Admission medications   Medication Sig Start Date End Date Taking? Authorizing Provider  aspirin EC 81 MG tablet Take 1 tablet (81 mg total) by mouth daily. 12/07/12  Yes JCarlena Bjornstad MD  atorvastatin (LIPITOR) 10 MG tablet TAKE 1 TABLET BY MOUTH EVERY DAY 08/13/15  Yes CDorothyann Peng NP  budesonide-formoterol (SYMBICORT) 160-4.5 MCG/ACT inhaler Inhale 2 puffs into the lungs 2 (two) times daily.   Yes Historical Provider, MD  carvedilol (COREG) 3.125 MG tablet TAKE 1 TABLET BY MOUTH TWICE A DAY WITH MEALS 04/21/15  Yes CDorothyann Peng NP  furosemide (LASIX) 40 MG tablet Take 40 mg by mouth daily. May take 1 extra daily as needed for edema 08/05/13  Yes JDorena Cookey MD  irbesartan (AVAPRO) 75 MG tablet TAKE 1 TABLET BY MOUTH EVERY DAY 03/09/15  Yes CDorothyann Peng NP  metFORMIN (GLUCOPHAGE) 1000 MG tablet  Take 1 tablet (1,000 mg total) by mouth 2 (two) times daily with a meal. Patient taking differently: Take 1,000 mg by mouth daily with breakfast.  01/29/15  Yes CDorothyann Peng NP  nitroGLYCERIN (NITROSTAT) 0.4 MG SL tablet Place 1 tablet (0.4 mg total) under the tongue every 5 (five) minutes as needed. For chest pain 08/05/13  Yes JDorena Cookey MD  [facility]  Wears 2L of O2 continuously   Yes Historical Provider, MD  ibuprofen (ADVIL,MOTRIN) 600 MG tablet Take 1 tablet (600 mg total) by mouth 3 (three) times daily. 09/02/15   Leonard Schwartz, MD   BP 112/62 mmHg  Pulse 75  Temp(Src) 97.7 F (36.5 C) (Oral)  Resp 16  Ht '6\' 2"'$  (1.88 m)  Wt 211 lb (95.709 kg)  BMI 27.08 kg/m2  SpO2 97% Physical Exam  Constitutional: He is oriented to person, place, and time. He appears well-developed and well-nourished. No distress.  HENT:  Head: Normocephalic and atraumatic.  Eyes: Pupils are equal, round, and reactive to light.  Neck: Normal range of motion.  Cardiovascular: Normal rate and intact distal pulses.   Pulmonary/Chest: No respiratory distress.  Abdominal: Normal appearance. He exhibits no distension.  Musculoskeletal:       Back:  Tenderness to palpation over the right sciatic notch.  Neurological: He is alert and oriented to person, place, and time. No cranial nerve deficit.  Skin: Skin is warm and dry. No rash noted.  Psychiatric: He has a normal mood and affect. His behavior is normal.  Nursing note and vitals reviewed.   ED Course  Procedures (including critical care time) Medications  ketorolac (TORADOL) 30 MG/ML injection 30 mg (not administered)    Labs Review Labs Reviewed - No data to display  Imaging Review Dg Lumbar Spine Complete  09/02/2015  CLINICAL DATA:  The patient bent over and felt a pop in his low back 3 days ago with onset of pain. Initial encounter. EXAM: LUMBAR SPINE - COMPLETE 4+ VIEW COMPARISON:  CT abdomen and pelvis 06/07/2008. FINDINGS: No fracture  is identified. There is marked multilevel loss of disc space height. The patient has bilateral L4 pars interarticularis defects with associated 0.7 cm anterolisthesis L4 on L5, unchanged since the prior CT. Paraspinous structures demonstrate extensive atherosclerosis. IMPRESSION: No acute abnormality. Advanced multilevel spondylosis. Bilateral L4 pars interarticularis defects with unchanged grade 1 anterolisthesis L4 on L5. Convex right scoliosis. Electronically Signed   By: Inge Rise M.D.   On: 09/02/2015 15:22   Dg Hip Unilat With Pelvis 2-3 Views Right  09/02/2015  CLINICAL DATA:  70 year old male with acute onset lumbar back pain radiating to the posterior right hip. Initial encounter. EXAM: DG HIP (WITH OR WITHOUT PELVIS) 2-3V RIGHT COMPARISON:  None. FINDINGS: Femoral heads are normally located. Hip joint spaces are relatively preserved and appear symmetric. Right inguinal surgical clips. Pelvis intact. Sacral ala appear grossly intact. SI joints within normal limits. Grossly intact proximal left femur. Proximal right femur appears intact. Aortoiliac calcified atherosclerosis noted. Right femoral artery calcified atherosclerosis. IMPRESSION: 1. No acute osseous abnormality identified about the right hip or femur. 2. Calcified aortic atherosclerosis. Electronically Signed   By: Genevie Ann M.D.   On: 09/02/2015 15:12   I have personally reviewed and evaluated these images and lab results as part of my medical decision-making.    MDM   Final diagnoses:  Sciatica of right side        Leonard Schwartz, MD 09/02/15 1535

## 2015-09-08 ENCOUNTER — Other Ambulatory Visit: Payer: Self-pay | Admitting: Adult Health

## 2015-09-11 ENCOUNTER — Telehealth: Payer: Self-pay

## 2015-09-11 NOTE — Telephone Encounter (Signed)
-----   Message from Audrea Muscat, [place] sent at 09/11/2015 11:30 AM EST ----- Not sure why I sent this to myself - its a [name] patient.  I sent it to you because I'm afraid I'll forget to do it!!!!! :(  ----- Message -----    From: Audrea Muscat, CMA    Sent: 08/26/2015      To: Audrea Muscat, CMA  Patient due for 4th twinrix (accelerated)  Due 09/25/2015

## 2015-09-15 NOTE — Telephone Encounter (Signed)
Letter mailed to remind pt to come in for last twin rix

## 2015-09-24 ENCOUNTER — Encounter (HOSPITAL_COMMUNITY): Payer: Self-pay | Admitting: Emergency Medicine

## 2015-09-24 ENCOUNTER — Emergency Department (HOSPITAL_COMMUNITY): Payer: Medicare Other

## 2015-09-24 ENCOUNTER — Observation Stay (HOSPITAL_COMMUNITY)
Admission: EM | Admit: 2015-09-24 | Discharge: 2015-09-27 | Disposition: A | Discharge status: Home / Self Care | Payer: Medicare Other | Attending: Internal Medicine | Admitting: Internal Medicine

## 2015-09-24 DIAGNOSIS — Z85118 Personal history of other malignant neoplasm of bronchus and lung: Secondary | ICD-10-CM | POA: Diagnosis not present

## 2015-09-24 DIAGNOSIS — Z952 Presence of prosthetic heart valve: Secondary | ICD-10-CM | POA: Insufficient documentation

## 2015-09-24 DIAGNOSIS — I252 Old myocardial infarction: Secondary | ICD-10-CM | POA: Insufficient documentation

## 2015-09-24 DIAGNOSIS — J9610 Chronic respiratory failure, unspecified whether with hypoxia or hypercapnia: Secondary | ICD-10-CM | POA: Diagnosis not present

## 2015-09-24 DIAGNOSIS — I272 Other secondary pulmonary hypertension: Secondary | ICD-10-CM | POA: Diagnosis not present

## 2015-09-24 DIAGNOSIS — Z953 Presence of xenogenic heart valve: Secondary | ICD-10-CM

## 2015-09-24 DIAGNOSIS — R918 Other nonspecific abnormal finding of lung field: Secondary | ICD-10-CM | POA: Diagnosis not present

## 2015-09-24 DIAGNOSIS — I2510 Atherosclerotic heart disease of native coronary artery without angina pectoris: Secondary | ICD-10-CM | POA: Insufficient documentation

## 2015-09-24 DIAGNOSIS — I482 Chronic atrial fibrillation: Secondary | ICD-10-CM | POA: Insufficient documentation

## 2015-09-24 DIAGNOSIS — Z7984 Long term (current) use of oral hypoglycemic drugs: Secondary | ICD-10-CM | POA: Insufficient documentation

## 2015-09-24 DIAGNOSIS — R404 Transient alteration of awareness: Secondary | ICD-10-CM | POA: Diagnosis not present

## 2015-09-24 DIAGNOSIS — E119 Type 2 diabetes mellitus without complications: Secondary | ICD-10-CM | POA: Diagnosis not present

## 2015-09-24 DIAGNOSIS — Z9049 Acquired absence of other specified parts of digestive tract: Secondary | ICD-10-CM | POA: Insufficient documentation

## 2015-09-24 DIAGNOSIS — I110 Hypertensive heart disease with heart failure: Secondary | ICD-10-CM | POA: Diagnosis not present

## 2015-09-24 DIAGNOSIS — E785 Hyperlipidemia, unspecified: Secondary | ICD-10-CM | POA: Diagnosis not present

## 2015-09-24 DIAGNOSIS — I152 Hypertension secondary to endocrine disorders: Secondary | ICD-10-CM | POA: Diagnosis present

## 2015-09-24 DIAGNOSIS — R55 Syncope and collapse: Secondary | ICD-10-CM | POA: Diagnosis present

## 2015-09-24 DIAGNOSIS — Z95810 Presence of automatic (implantable) cardiac defibrillator: Secondary | ICD-10-CM | POA: Diagnosis present

## 2015-09-24 DIAGNOSIS — Z8673 Personal history of transient ischemic attack (TIA), and cerebral infarction without residual deficits: Secondary | ICD-10-CM | POA: Insufficient documentation

## 2015-09-24 DIAGNOSIS — J449 Chronic obstructive pulmonary disease, unspecified: Secondary | ICD-10-CM | POA: Diagnosis present

## 2015-09-24 DIAGNOSIS — I701 Atherosclerosis of renal artery: Secondary | ICD-10-CM | POA: Insufficient documentation

## 2015-09-24 DIAGNOSIS — I5042 Chronic combined systolic (congestive) and diastolic (congestive) heart failure: Secondary | ICD-10-CM | POA: Diagnosis present

## 2015-09-24 DIAGNOSIS — R42 Dizziness and giddiness: Secondary | ICD-10-CM | POA: Diagnosis not present

## 2015-09-24 DIAGNOSIS — Z79899 Other long term (current) drug therapy: Secondary | ICD-10-CM | POA: Diagnosis not present

## 2015-09-24 DIAGNOSIS — Z7982 Long term (current) use of aspirin: Secondary | ICD-10-CM | POA: Diagnosis not present

## 2015-09-24 DIAGNOSIS — R531 Weakness: Secondary | ICD-10-CM | POA: Diagnosis not present

## 2015-09-24 DIAGNOSIS — I951 Orthostatic hypotension: Secondary | ICD-10-CM | POA: Diagnosis not present

## 2015-09-24 DIAGNOSIS — Z87891 Personal history of nicotine dependence: Secondary | ICD-10-CM | POA: Diagnosis not present

## 2015-09-24 DIAGNOSIS — J189 Pneumonia, unspecified organism: Secondary | ICD-10-CM

## 2015-09-24 DIAGNOSIS — I10 Essential (primary) hypertension: Secondary | ICD-10-CM | POA: Diagnosis present

## 2015-09-24 LAB — URINALYSIS, ROUTINE W REFLEX MICROSCOPIC
BILIRUBIN URINE: NEGATIVE
Glucose, UA: 100 mg/dL — AB
HGB URINE DIPSTICK: NEGATIVE
Ketones, ur: NEGATIVE mg/dL
Leukocytes, UA: NEGATIVE
NITRITE: NEGATIVE
PROTEIN: NEGATIVE mg/dL
SPECIFIC GRAVITY, URINE: 1.01 (ref 1.005–1.030)
pH: 6 (ref 5.0–8.0)

## 2015-09-24 LAB — BASIC METABOLIC PANEL
Anion gap: 13 (ref 5–15)
BUN: 20 mg/dL (ref 6–20)
CALCIUM: 9.1 mg/dL (ref 8.9–10.3)
CHLORIDE: 101 mmol/L (ref 101–111)
CO2: 25 mmol/L (ref 22–32)
CREATININE: 0.9 mg/dL (ref 0.61–1.24)
GFR calc Af Amer: >60 mL/min (ref >60)
Glucose, Bld: 216 mg/dL — ABNORMAL HIGH (ref 65–99)
Potassium: 4.2 mmol/L (ref 3.5–5.1)
SODIUM: 139 mmol/L (ref 135–145)

## 2015-09-24 LAB — CBC
HCT: 48.4 % (ref 39.0–52.0)
HEMOGLOBIN: 15.5 g/dL (ref 13.0–17.0)
MCH: 31.3 pg (ref 26.0–34.0)
MCHC: 32 g/dL (ref 30.0–36.0)
MCV: 97.8 fL (ref 78.0–100.0)
PLATELETS: 184 10*3/uL (ref 150–400)
RBC: 4.95 MIL/uL (ref 4.22–5.81)
RDW: 15 % (ref 11.5–15.5)
WBC: 9.9 10*3/uL (ref 4.0–10.5)

## 2015-09-24 LAB — PROTIME-INR
INR: 1.1 (ref 0.00–1.49)
PROTHROMBIN TIME: 14.4 s (ref 11.6–15.2)

## 2015-09-24 LAB — GLUCOSE, CAPILLARY
GLUCOSE-CAPILLARY: 225 mg/dL — AB (ref 65–99)
GLUCOSE-CAPILLARY: 167 mg/dL — AB (ref 65–99)

## 2015-09-24 LAB — I-STAT TROPONIN, ED: TROPONIN I, POC: 0 ng/mL (ref 0.00–0.08)

## 2015-09-24 LAB — TROPONIN I: Troponin I: <0.03 ng/mL (ref <0.031)

## 2015-09-24 LAB — CBG MONITORING, ED: GLUCOSE-CAPILLARY: 198 mg/dL — AB (ref 65–99)

## 2015-09-24 LAB — TSH: TSH: 0.592 u[IU]/mL (ref 0.350–4.500)

## 2015-09-24 LAB — BRAIN NATRIURETIC PEPTIDE: B Natriuretic Peptide: 285 pg/mL — ABNORMAL HIGH (ref 0.0–100.0)

## 2015-09-24 MED ORDER — SODIUM CHLORIDE 0.9% FLUSH
3.0000 mL | Freq: Two times a day (BID) | INTRAVENOUS | Status: DC
  Administered 2015-09-26 – 2015-09-27 (×3): 3 mL via INTRAVENOUS

## 2015-09-24 MED ORDER — INSULIN ASPART 100 UNIT/ML ~~LOC~~ SOLN
0.0000 [IU] | Freq: Three times a day (TID) | SUBCUTANEOUS | Status: DC
  Administered 2015-09-25 (×3): 3 [IU] via SUBCUTANEOUS
  Administered 2015-09-26 (×3): 2 [IU] via SUBCUTANEOUS
  Administered 2015-09-27 (×2): 3 [IU] via SUBCUTANEOUS

## 2015-09-24 MED ORDER — FUROSEMIDE 40 MG PO TABS: 40.0000 mg | ORAL_TABLET | Freq: Every day | ORAL | Status: DC

## 2015-09-24 MED ORDER — FUROSEMIDE 10 MG/ML IJ SOLN
60.0000 mg | Freq: Once | INTRAMUSCULAR | Status: AC
  Administered 2015-09-24: 60 mg via INTRAVENOUS
  Filled 2015-09-24: qty 8

## 2015-09-24 MED ORDER — ACETAMINOPHEN 650 MG RE SUPP: 650.0000 mg | Freq: Four times a day (QID) | RECTAL | Status: DC | PRN

## 2015-09-24 MED ORDER — SODIUM CHLORIDE 0.9% FLUSH
3.0000 mL | Freq: Two times a day (BID) | INTRAVENOUS | Status: DC
  Administered 2015-09-24 – 2015-09-25 (×2): 3 mL via INTRAVENOUS

## 2015-09-24 MED ORDER — ACETAMINOPHEN 325 MG PO TABS
650.0000 mg | ORAL_TABLET | Freq: Four times a day (QID) | ORAL | Status: DC | PRN
  Administered 2015-09-27: 650 mg via ORAL
  Filled 2015-09-24: qty 2

## 2015-09-24 MED ORDER — ONDANSETRON HCL 4 MG PO TABS
4.0000 mg | ORAL_TABLET | Freq: Four times a day (QID) | ORAL | Status: DC | PRN
  Administered 2015-09-25: 4 mg via ORAL
  Filled 2015-09-24: qty 1

## 2015-09-24 MED ORDER — ENOXAPARIN SODIUM 40 MG/0.4ML ~~LOC~~ SOLN
40.0000 mg | SUBCUTANEOUS | Status: DC
  Administered 2015-09-24 – 2015-09-26 (×3): 40 mg via SUBCUTANEOUS
  Filled 2015-09-24 (×4): qty 0.4

## 2015-09-24 MED ORDER — IRBESARTAN 75 MG PO TABS
75.0000 mg | ORAL_TABLET | Freq: Every day | ORAL | Status: DC
  Administered 2015-09-25: 75 mg via ORAL
  Filled 2015-09-24: qty 1

## 2015-09-24 MED ORDER — ONDANSETRON HCL 4 MG/2ML IJ SOLN
4.0000 mg | Freq: Four times a day (QID) | INTRAMUSCULAR | Status: DC | PRN
Start: 2015-09-24 — End: 2015-09-27
  Administered 2015-09-27: 4 mg via INTRAVENOUS
  Filled 2015-09-24: qty 2

## 2015-09-24 MED ORDER — SODIUM CHLORIDE 0.9 % IV SOLN: 250.0000 mL | INTRAVENOUS | Status: DC | PRN

## 2015-09-24 MED ORDER — INSULIN ASPART 100 UNIT/ML ~~LOC~~ SOLN: 0.0000 [IU] | Freq: Every day | SUBCUTANEOUS | Status: DC

## 2015-09-24 MED ORDER — HYDROCODONE-ACETAMINOPHEN 5-325 MG PO TABS
2.0000 | ORAL_TABLET | ORAL | Status: DC | PRN
  Administered 2015-09-26 (×2): 2 via ORAL
  Filled 2015-09-24 (×2): qty 2

## 2015-09-24 MED ORDER — METFORMIN HCL 500 MG PO TABS
1000.0000 mg | ORAL_TABLET | Freq: Two times a day (BID) | ORAL | Status: DC
  Administered 2015-09-24 – 2015-09-27 (×6): 1000 mg via ORAL
  Filled 2015-09-24 (×7): qty 2

## 2015-09-24 MED ORDER — ATORVASTATIN CALCIUM 10 MG PO TABS
10.0000 mg | ORAL_TABLET | Freq: Every day | ORAL | Status: DC
  Administered 2015-09-24 – 2015-09-27 (×4): 10 mg via ORAL
  Filled 2015-09-24 (×4): qty 1

## 2015-09-24 MED ORDER — SODIUM CHLORIDE 0.9% FLUSH: 3.0000 mL | INTRAVENOUS | Status: DC | PRN

## 2015-09-24 MED ORDER — MOMETASONE FURO-FORMOTEROL FUM 200-5 MCG/ACT IN AERO
2.0000 | INHALATION_SPRAY | Freq: Two times a day (BID) | RESPIRATORY_TRACT | Status: DC
  Administered 2015-09-24 – 2015-09-26 (×4): 2 via RESPIRATORY_TRACT
  Filled 2015-09-24: qty 8.8

## 2015-09-24 MED ORDER — CARVEDILOL 3.125 MG PO TABS
3.1250 mg | ORAL_TABLET | Freq: Two times a day (BID) | ORAL | Status: DC
  Administered 2015-09-24 – 2015-09-27 (×6): 3.125 mg via ORAL
  Filled 2015-09-24 (×6): qty 1

## 2015-09-24 NOTE — ED Notes (Signed)
Pt can go up at 17:05

## 2015-09-24 NOTE — ED Notes (Signed)
Per EMS, patient lives at home, uses home O2 at 2L. Patient does not use his O2 when he goes out. Patient was out eating lunch when he became weak, nauseated, and dizzy. Patient was 94% on room air, placed patient on 4LNC. Fire initially responded and placed the patient on a NRB. Patient has hx of MI in 2014, has a ventricular pacer, also hx of CVA & DM.

## 2015-09-24 NOTE — ED Provider Notes (Signed)
CSN: [id number]     Arrival date & time 09/24/15  1259 History   First MD Initiated Contact with Patient 09/24/15 1344     Chief Complaint  Patient presents with  . Dizziness  . Nausea   HPI Comments: 70 year old male with complicated medical history who presents with acute onset of nausea and dizziness while eating at a restaurant with his wife this afternoon. Wife and daughter are at bedside. His wife states he suddenly had difficulty speaking, developed pallor and became diaphoretic. He did not have LOC. He currently reports feeling nauseous and dizzy. He was brought in by EMS who gave Zofran. He reports associated generalized weakness and fatigue. Denies fever, history of vertigo, headache, loss of vision, weakness on one side of the body, chest pain, SOB, abdominal pain, vomiting, diarrhea, numbness, blood in vomit or stool. He is currently on ASA 81 mg since he has A.fib but cannot be anticoagulated due to previous hx of ICH in 2009. Has a defibrillator/pacer. Hx of lung cancer with resection and is on 2L O2 via [place] at home and is requiring 4L in the ED today.  The history is provided by the patient, the spouse and a relative (wife and daughter).    Past Medical History  Diagnosis Date  . Hypertension   . Endocarditis     Bacterial, 2009  . Colon polyps   . Dyslipidemia   . Atrial fibrillation ([place])     AV Node ablation January, 2010, for rapid atrial fib  . Cardiomyopathy     non-ischemic  . Ejection fraction < 50%   . Atrial septal defect     Closed with surgery January, 2010  . Mitral regurgitation     Severe symptomatic primary MR due to bacterial endocarditis, treated w/ MVR  . Intracranial hemorrhage (HCC)     Coumadin cannot be used because of the history of his bleed  . Renal artery stenosis (HCC)     Mild by history  . Pulmonary hypertension (HCC)     Moderate  . HLD (hyperlipidemia)   . Automatic implantable cardioverter-defibrillator in situ     LV dysfunction and  pacer needed for AV node lesion  . CVA (cerebral vascular accident) ([place]) 2009    denies residual on 08/14/2013  . CHF (congestive heart failure) ([place])   . Myocardial infarction ([place]) 2010  . Dysrhythmia   . Pneumonia     "this is my first case" (08/14/2013)  . Spontaneous pneumothorax     right thoracotomy - distant past  . Permanent atrial fibrillation     Originally Coumadin use for atrial fibrillation  //   he had intracerebral hemorrhage with an INR of 2.3 June, 2009. Anticoagulation could no longer be used.  //  Rapid atrial fibrillation after inferior MI October, 2010..........Marland Kitchen AV node ablation done at that time with ICD pacemaker placed (EF 35%).   //   Left atrial appendage tied off at the time of mitral valve surgery January, 2010 (maze pro  . Status post minimally invasive mitral valve replacement with bioprosthetic valve     33 mm Medtronic Mosaic porcine bioprosthesis placed via right mini thoracotomy for bacterial endocarditis complicated by severe MR and CHF   . Prosthetic valve dysfunction     Mild mitral stenosis  . Chronic combined systolic and diastolic CHF (congestive heart failure) ([place])   . Pacemaker   . Diabetes mellitus without complication ([place])   . Headache(784.0)     related  to stroke only  . COPD (chronic obstructive pulmonary disease) (HCC)     O2- 2 liters, nasal cannula, q night   . COPD GOLD II 01/11/2007    PFT's 09/25/13  FEV1  2.18 (61%) ratio 64 no change p B2 and DLC0  32% corrects to 68% - trial off advair and acei rec starting  08/23/2013     . Lung cancer ([place]) 11/29/2013    T1N0 Stage Ia non-small cell carcinoma left lung treated with wedge resection   Past Surgical History  Procedure Laterality Date  . Mitral valve replacement Right 07/17/2008    99m Medtronic Mosaic porcine bioprosthesis  . Cardiac defibrillator placement  ~ 2010    St Jude  . Penile prosthesis implant    . Appendectomy    . Mass biopsy Left     neck mass  . Leep    . Cataract  extraction w/ intraocular lens  implant, bilateral Bilateral   . Asd repair, secundum  07/17/2008    pericardial patch closure of ASD  . Av node ablation  07/2008    for rapid atrial fib  . Thoracotomy Right 1970's    spontaneous pneumothorax - while in the [employer] . Tonsillectomy    . Insert / replace / remove pacemaker    . Cardiac valve replacement    . Cardiac catheterization    . Video assisted thoracoscopy (vats)/wedge resection Left 11/29/2013    Procedure: Video assisted thoracoscopy for wedge resection; mini thoracotomy;  Surgeon: CRexene Alberts MD;  [facility]  Service: Thoracic;  Laterality: Left;  . Implantable cardioverter defibrillator (icd) generator change N/A 02/06/2012    Procedure: ICD GENERATOR CHANGE;  Surgeon: GEvans Lance MD;  [facility];  Service: Cardiovascular;  Laterality: N/A;  . Right heart catheterization N/A 08/16/2013    Procedure: RIGHT HEART CATH;  Surgeon: PJosue Hector MD;  [facility];  Service: Cardiovascular;  Laterality: N/A;   Family History  Problem Relation Age of Onset  . Stomach cancer Father   . Stroke Mother    Social History  Substance Use Topics  . Smoking status: Former Smoker -- 1.00 packs/day for 45 years    Types: Cigarettes    Quit date: 08/11/2013  . Smokeless tobacco: Never Used     Comment: 08/14/2013 "quit smoking in 2009"  . Alcohol Use: No     Comment: 08/14/2013 "used to drink beer; quit:in 1982"    Review of Systems  Constitutional: Positive for diaphoresis and fatigue. Negative for fever and activity change.  HENT: Positive for hearing loss. Negative for trouble swallowing.        Hard of hearing  Eyes: Negative for visual disturbance.  Respiratory: Negative for shortness of breath.   Cardiovascular: Negative for chest pain, palpitations and leg swelling.  Gastrointestinal: Positive for nausea. Negative for vomiting, abdominal pain, diarrhea and blood in stool.  Endocrine: Negative.    Genitourinary: Negative.   Musculoskeletal: Positive for arthralgias.       Chronic R hip pain  Skin: Positive for pallor.  Neurological: Positive for dizziness, speech difficulty, weakness and light-headedness. Negative for tremors, syncope, facial asymmetry, numbness and headaches.  Psychiatric/Behavioral: Negative for confusion.      Allergies  Anticoagulant compound and Warfarin sodium  Home Medications   Prior to Admission medications   Medication Sig Start Date End Date Taking? Authorizing Provider  amoxicillin (AMOXIL) 500 MG capsule Take 2,000 mg by mouth See admin instructions.  1 hour prior to dental appointments. 09/09/15  Yes Historical Provider, MD  aspirin EC 81 MG tablet Take 1 tablet (81 mg total) by mouth daily. 12/07/12  Yes Carlena Bjornstad, MD  atorvastatin (LIPITOR) 10 MG tablet TAKE 1 TABLET BY MOUTH EVERY DAY 08/13/15  Yes [name], NP  budesonide-formoterol (SYMBICORT) 160-4.5 MCG/ACT inhaler Inhale 2 puffs into the lungs 2 (two) times daily.   Yes Historical Provider, MD  carvedilol (COREG) 3.125 MG tablet TAKE 1 TABLET BY MOUTH TWICE A DAY WITH MEALS 04/21/15  Yes [name], NP  furosemide (LASIX) 40 MG tablet Take 40 mg by mouth daily. May take 1 extra daily as needed for edema 08/05/13  Yes Dorena Cookey, MD  irbesartan (AVAPRO) 75 MG tablet TAKE 1 TABLET BY MOUTH EVERY DAY 09/08/15  Yes [name], NP  metFORMIN (GLUCOPHAGE) 1000 MG tablet Take 1 tablet (1,000 mg total) by mouth 2 (two) times daily with a meal. Patient taking differently: Take 1,000 mg by mouth daily with breakfast.  01/29/15  Yes [name], NP  nitroGLYCERIN (NITROSTAT) 0.4 MG SL tablet Place 1 tablet (0.4 mg total) under the tongue every 5 (five) minutes as needed. For chest pain 08/05/13  Yes Dorena Cookey, MD  NON FORMULARY Wears 2L of O2 continuously   Yes Historical Provider, MD  HYDROcodone-acetaminophen (NORCO/VICODIN) 5-325 MG tablet Take 2 tablets by mouth every 4 (four)  hours as needed. Patient not taking: Reported on 09/24/2015 09/02/15   Leonard Schwartz, MD  ibuprofen (ADVIL,MOTRIN) 600 MG tablet Take 1 tablet (600 mg total) by mouth 3 (three) times daily. Patient not taking: Reported on 09/24/2015 09/02/15   Leonard Schwartz, MD   BP 128/81 mmHg  Pulse 70  Temp(Src) 97.6 F (36.4 C) (Oral)  Resp 19  Ht '6\' 2"'$  (1.88 m)  Wt 95.709 kg  BMI 27.08 kg/m2  SpO2 94% Physical Exam  Constitutional: He appears well-nourished. No distress.  Appears chronically ill  HENT:  Head: Normocephalic and atraumatic.  Eyes: Pupils are equal, round, and reactive to light.  Neck: No JVD present.  Cardiovascular: Normal rate, regular rhythm, normal heart sounds and intact distal pulses.  Exam reveals no gallop and no friction rub.   No murmur heard. Pulmonary/Chest: No accessory muscle usage. No respiratory distress. He has decreased breath sounds. He has no wheezes. He has no rhonchi. He has no rales.  Abdominal: There is no tenderness.  Neurological:  Mental Status:  Alert, oriented, hard of hearing. Speech fluent without evidence of aphasia. Able to follow 2 step commands without difficulty.  Cranial Nerves:  II:  Peripheral visual fields grossly normal, pupils equal, round, reactive to light III,IV, VI: ptosis not present, extra-ocular motions intact bilaterally  V,VII: smile symmetric, facial light touch sensation equal VIII: hearing grossly normal to voice  X: uvula elevates symmetrically  XI: bilateral shoulder shrug symmetric and strong XII: midline tongue extension without fassiculations Motor:  Normal tone. 5/5 in upper and lower extremities bilaterally including strong and equal grip strength and dorsiflexion/plantar flexion Sensory: Pinprick and light touch normal in all extremities.  Cerebellar: normal finger-to-nose with bilateral upper extremities     Skin: Skin is warm and dry. He is not diaphoretic.  Psychiatric:  Slow to respond    ED Course   Procedures (including critical care time) Labs Review Labs Reviewed  BASIC METABOLIC PANEL - Abnormal; Notable for the following:    Glucose, Bld 216 (*)    All other components within normal  limits  BRAIN NATRIURETIC PEPTIDE - Abnormal; Notable for the following:    B Natriuretic Peptide 285.0 (*)    All other components within normal limits  CBG MONITORING, ED - Abnormal; Notable for the following:    Glucose-Capillary 198 (*)    All other components within normal limits  CBC  PROTIME-INR  URINALYSIS, ROUTINE W REFLEX MICROSCOPIC (NOT AT [facility])  Randolm Idol, ED    Imaging Review Dg Chest 2 View  09/24/2015  CLINICAL DATA:  Weakness and dizziness EXAM: CHEST  2 VIEW COMPARISON:  06/22/2015 FINDINGS: Cardiac shadow is within normal limits. A defibrillator is again noted. The lungs show patchy bibasilar infiltrates. No sizable effusion is seen. Elevation of left hemidiaphragm is noted. IMPRESSION: Patchy bibasilar infiltrates. Electronically Signed   By: Inez Catalina M.D.   On: 09/24/2015 15:01   I have personally reviewed and evaluated these images and lab results as part of my medical decision-making.   EKG Interpretation   Date/Time:  Thursday September 24 2015 13:31:24 EDT Ventricular Rate:  70 PR Interval:    QRS Duration: 135 QT Interval:  453 QTC Calculation: 489 R Axis:   -87 Text Interpretation:  Afib/flutter and ventricular-paced rhythm No further  analysis attempted due to paced rhythm Baseline wander in lead(s) V1 V2  VENTRICULAR PACED RHYTHM Artifact Abnormal ekg Confirmed by Carmin Muskrat  MD [phone]) on 09/24/2015 2:21:55 PM     Interrogation of pacer shows no arrhythmia.  MDM   Final diagnoses:  Near syncope    70 year old male with multiple medical problems presents with near syncope. Still awaiting CT of head results however unlikely he had a repeat stroke since he does not have neuro deficits and interrogation of pacer showed no arrhythmias. Also  unlikely he is having a MI. Troponin is 0. Do not suspect PNA since he is afebrile and does not have a white count. His CXR does show increased bibasilar infiltrates compared to previous CXR from 2015. Gave him '60mg'$  Lasix IV. Pt reports poor adherence with low Na diet. Due to his multiple medical problems he will be admitted over night for obs. Hospitalist was consulted. Pt seen in conjunction with Dr. Vanita Panda.    Recardo Evangelist, PA-C 09/24/15 1621  Carmin Muskrat, MD 09/28/15 [phone]

## 2015-09-24 NOTE — ED Notes (Signed)
Pt. Is unable to urinate at this time.  

## 2015-09-24 NOTE — H&P (Signed)
[facility] History and Physical  [name] ZDG:[id number] DOB: Aug 18, 1945 DOA: 09/24/2015  Referring physician:  PCP: Charles Peng, NP   Chief Complaint: Near Syncope  HPI: [name] is a 70 y.o. male with multiple comorbidities including having a history of endocarditis status post mitral valve replacement with Bob prostatic valve, history of atrial fibrillation, history of intracranial hemorrhage in setting of Coumadin therapy, status post AICD placement, chronic obstructive pulmonary disease, presented to the emergency department after having a near syncope event earlier in the day. [name] reports being in his usual state health, this afternoon went to [employer] with his wife, when he sat down at the [place] he had sided onset dizziness/lightheadedness feeling like he was about to pass out. His wife noted that he became pale and diaphoretic. She called 911 immediately. He denied chest pain, shortness of breath, fevers, chills, cough, sputum production, abdominal pain, bloody stools, focal neurological deficits. In the emergency department he had a CT scan of brain which did not reveal acute intracranial abnormalities. Symptoms resolving by the time he reached the emergency department. He reports having a fall in his bathroom about 3 weeks ago which attributed to losing his balance. Denies any other recent falls.                             Review of Systems:  Constitutional:  No weight loss, night sweats, Fevers, chills, fatigue. Positive for dizziness/lightheadedness  HEENT:  No headaches, Difficulty swallowing,Tooth/dental problems,Sore throat,  No sneezing, itching, ear ache, nasal congestion, post nasal drip,  Cardio-vascular:  No chest pain, Orthopnea, PND, swelling in lower extremities, anasarca, dizziness, palpitations  GI:  No heartburn, indigestion, abdominal pain, nausea, vomiting, diarrhea, change in bowel habits, loss of appetite  Resp:  No shortness of  breath with exertion or at rest. No excess mucus, no productive cough, No non-productive cough, No coughing up of blood.No change in color of mucus.No wheezing.No chest wall deformity  Skin:  no rash or lesions.  GU:  no dysuria, change in color of urine, no urgency or frequency. No flank pain.  Musculoskeletal:  No joint pain or swelling. No decreased range of motion. No back pain.  Psych:  No change in mood or affect. No depression or anxiety. No memory loss.   Past Medical History  Diagnosis Date  . Hypertension   . Endocarditis     Bacterial, 2009  . Colon polyps   . Dyslipidemia   . Atrial fibrillation ([place])     AV Node ablation January, 2010, for rapid atrial fib  . Cardiomyopathy     non-ischemic  . Ejection fraction < 50%   . Atrial septal defect     Closed with surgery January, 2010  . Mitral regurgitation     Severe symptomatic primary MR due to bacterial endocarditis, treated w/ MVR  . Intracranial hemorrhage (HCC)     Coumadin cannot be used because of the history of his bleed  . Renal artery stenosis (HCC)     Mild by history  . Pulmonary hypertension (HCC)     Moderate  . HLD (hyperlipidemia)   . Automatic implantable cardioverter-defibrillator in situ     LV dysfunction and pacer needed for AV node lesion  . CVA (cerebral vascular accident) ([place]) 2009    denies residual on 08/14/2013  . CHF (congestive heart failure) ([place])   . Myocardial infarction ([place]) 2010  . Dysrhythmia   .  Pneumonia     "this is my first case" (08/14/2013)  . Spontaneous pneumothorax     right thoracotomy - distant past  . Permanent atrial fibrillation     Originally Coumadin use for atrial fibrillation  //   he had intracerebral hemorrhage with an INR of 2.3 June, 2009. Anticoagulation could no longer be used.  //  Rapid atrial fibrillation after inferior MI October, 2010..........Marland Kitchen AV node ablation done at that time with ICD pacemaker placed (EF 35%).   //   Left atrial appendage tied  off at the time of mitral valve surgery January, 2010 (maze pro  . Status post minimally invasive mitral valve replacement with bioprosthetic valve     33 mm Medtronic Mosaic porcine bioprosthesis placed via right mini thoracotomy for bacterial endocarditis complicated by severe MR and CHF   . Prosthetic valve dysfunction     Mild mitral stenosis  . Chronic combined systolic and diastolic CHF (congestive heart failure) ([place])   . Pacemaker   . Diabetes mellitus without complication ([place])   . Headache(784.0)     related to stroke only  . COPD (chronic obstructive pulmonary disease) (HCC)     O2- 2 liters, nasal cannula, q night   . COPD GOLD II 01/11/2007    PFT's 09/25/13  FEV1  2.18 (61%) ratio 64 no change p B2 and DLC0  32% corrects to 68% - trial off advair and acei rec starting  08/23/2013     . Lung cancer ([place]) 11/29/2013    T1N0 Stage Ia non-small cell carcinoma left lung treated with wedge resection   Past Surgical History  Procedure Laterality Date  . Mitral valve replacement Right 07/17/2008    41m Medtronic Mosaic porcine bioprosthesis  . Cardiac defibrillator placement  ~ 2010    St Jude  . Penile prosthesis implant    . Appendectomy    . Mass biopsy Left     neck mass  . Leep    . Cataract extraction w/ intraocular lens  implant, bilateral Bilateral   . Asd repair, secundum  07/17/2008    pericardial patch closure of ASD  . Av node ablation  07/2008    for rapid atrial fib  . Thoracotomy Right 1970's    spontaneous pneumothorax - while in the [employer] . Tonsillectomy    . Insert / replace / remove pacemaker    . Cardiac valve replacement    . Cardiac catheterization    . Video assisted thoracoscopy (vats)/wedge resection Left 11/29/2013    Procedure: Video assisted thoracoscopy for wedge resection; mini thoracotomy;  Surgeon: CRexene Alberts MD;  [facility]  Service: Thoracic;  Laterality: Left;  . Implantable cardioverter defibrillator (icd) generator change N/A  02/06/2012    Procedure: ICD GENERATOR CHANGE;  Surgeon: GEvans Lance MD;  [facility];  Service: Cardiovascular;  Laterality: N/A;  . Right heart catheterization N/A 08/16/2013    Procedure: RIGHT HEART CATH;  Surgeon: PJosue Hector MD;  [facility];  Service: Cardiovascular;  Laterality: N/A;   Social History:  reports that he quit smoking about 2 years ago. His smoking use included Cigarettes. He has a 45 pack-year smoking history. He has never used smokeless tobacco. He reports that he does not drink alcohol or use illicit drugs.  Allergies  Allergen Reactions  . Anticoagulant Compound Other (See Comments)    Pt had intracranial bleed, therefore all anticoagulation is contra-indicated per Dr. KRon Parker  . Warfarin  Sodium Other (See Comments)    Pt had intracranial bleed, therefore all anticoagulation is contra-indicated per Dr. Ron Parker.     Family History  Problem Relation Age of Onset  . Stomach cancer Father   . Stroke Mother     Prior to Admission medications   Medication Sig Start Date End Date Taking? Authorizing Provider  amoxicillin (AMOXIL) 500 MG capsule Take 2,000 mg by mouth See admin instructions. 1 hour prior to dental appointments. 09/09/15  Yes Historical Provider, MD  aspirin EC 81 MG tablet Take 1 tablet (81 mg total) by mouth daily. 12/07/12  Yes Charles Bjornstad, MD  atorvastatin (LIPITOR) 10 MG tablet TAKE 1 TABLET BY MOUTH EVERY DAY 08/13/15  Yes Charles Peng, NP  budesonide-formoterol (SYMBICORT) 160-4.5 MCG/ACT inhaler Inhale 2 puffs into the lungs 2 (two) times daily.   Yes Historical Provider, MD  carvedilol (COREG) 3.125 MG tablet TAKE 1 TABLET BY MOUTH TWICE A DAY WITH MEALS 04/21/15  Yes Charles Peng, NP  furosemide (LASIX) 40 MG tablet Take 40 mg by mouth daily. May take 1 extra daily as needed for edema 08/05/13  Yes Dorena Cookey, MD  irbesartan (AVAPRO) 75 MG tablet TAKE 1 TABLET BY MOUTH EVERY DAY 09/08/15  Yes Charles Peng, NP  metFORMIN  (GLUCOPHAGE) 500 MG tablet Take 250-500 mg by mouth See admin instructions. Take 500 mg (1 tablet) every morning, and 250 mg (one-half tablet) every afternoon.   Yes Historical Provider, MD  nitroGLYCERIN (NITROSTAT) 0.4 MG SL tablet Place 1 tablet (0.4 mg total) under the tongue every 5 (five) minutes as needed. For chest pain 08/05/13  Yes Dorena Cookey, MD  NON FORMULARY Wears 2L of O2 continuously   Yes Historical Provider, MD  HYDROcodone-acetaminophen (NORCO/VICODIN) 5-325 MG tablet Take 2 tablets by mouth every 4 (four) hours as needed. Patient not taking: Reported on 09/24/2015 09/02/15   Leonard Schwartz, MD  ibuprofen (ADVIL,MOTRIN) 600 MG tablet Take 1 tablet (600 mg total) by mouth 3 (three) times daily. Patient not taking: Reported on 09/24/2015 09/02/15   Leonard Schwartz, MD  metFORMIN (GLUCOPHAGE) 1000 MG tablet Take 1 tablet (1,000 mg total) by mouth 2 (two) times daily with a meal. Patient not taking: Reported on 09/24/2015 01/29/15   Charles Peng, NP   Physical Exam: Filed Vitals:   09/24/15 1304 09/24/15 1312 09/24/15 1403 09/24/15 1600  BP:  122/74 128/81 122/77  Pulse:  70 70 73  Temp:  97.6 F (36.4 C)    TempSrc:  Oral    Resp:  '20 19 17  '$ Height:  '6\' 2"'$  (1.88 m)    Weight:  95.709 kg (211 lb)    SpO2: 94% 93% 94% 92%    Wt Readings from Last 3 Encounters:  09/24/15 95.709 kg (211 lb)  09/02/15 95.709 kg (211 lb)  08/25/15 96.072 kg (211 lb 12.8 oz)    General:  Appears calm and comfortable, he is in no acute distress, presently denies any symptoms. Eyes: PERRL, normal lids, irises & conjunctiva ENT: grossly normal hearing, lips & tongue Neck: no LAD, masses or thyromegaly Cardiovascular: RRR, no m/r/g. Trace edema to lower extremities Telemetry: SR, no arrhythmias  Respiratory: CTA bilaterally, no w/r/r. Normal respiratory effort. He has bibasilar crackles otherwise normal respiratory effort. Scattered tori wheezes. Diminished breath sounds bilaterally. Abdomen:  soft, ntnd Skin: no rash or induration seen on limited exam Musculoskeletal: grossly normal tone BUE/BLE Psychiatric: grossly normal mood and affect, speech fluent and appropriate Neurologic: grossly non-focal.  Labs on Admission:  Basic Metabolic Panel:  Recent Labs Lab 09/24/15 1350  NA 139  K 4.2  CL 101  CO2 25  GLUCOSE 216*  BUN 20  CREATININE 0.90  CALCIUM 9.1   Liver Function Tests: No results for input(s): AST, ALT, ALKPHOS, BILITOT, PROT, ALBUMIN in the last 168 hours. No results for input(s): LIPASE, AMYLASE in the last 168 hours. No results for input(s): AMMONIA in the last 168 hours. CBC:  Recent Labs Lab 09/24/15 1350  WBC 9.9  HGB 15.5  HCT 48.4  MCV 97.8  PLT 184   Cardiac Enzymes: No results for input(s): CKTOTAL, CKMB, CKMBINDEX, TROPONINI in the last 168 hours.  BNP (last 3 results)  Recent Labs  09/24/15 1350  BNP 285.0*    ProBNP (last 3 results) No results for input(s): PROBNP in the last 8760 hours.  CBG:  Recent Labs Lab 09/24/15 1419  GLUCAP 198*    Radiological Exams on Admission: Dg Chest 2 View  09/24/2015  CLINICAL DATA:  Weakness and dizziness EXAM: CHEST  2 VIEW COMPARISON:  06/22/2015 FINDINGS: Cardiac shadow is within normal limits. A defibrillator is again noted. The lungs show patchy bibasilar infiltrates. No sizable effusion is seen. Elevation of left hemidiaphragm is noted. IMPRESSION: Patchy bibasilar infiltrates. Electronically Signed   By: Inez Catalina M.D.   On: 09/24/2015 15:01   Ct Head Wo Contrast  09/24/2015  CLINICAL DATA:  Dizziness today at lunch Lung ca dx 2015 Left lung surgery only Hx COPD, CVA, EXAM: CT HEAD WITHOUT CONTRAST TECHNIQUE: Contiguous axial images were obtained from the base of the skull through the vertex without intravenous contrast. COMPARISON:  11/22/2013 FINDINGS: There is central and cortical atrophy. Periventricular white matter changes are consistent with small vessel  disease. Remote cortical infarcts involve the left posterior temporal lobe and the right posterior parietal lobe. There is no intra or extra-axial fluid collection or mass lesion. The basilar cisterns and ventricles have a normal appearance. There is no CT evidence for acute infarction or hemorrhage. Bone windows show no acute findings. There is atherosclerotic calcification of the internal carotid arteries. IMPRESSION: 1. Atrophy, small vessel disease. 2. Stable appearance of remote cortical infarcts bilaterally. 3.  No evidence for acute intracranial abnormality. Electronically Signed   By: Nolon Nations M.D.   On: 09/24/2015 16:25    EKG: Independently reviewed.   Assessment/Plan Principal Problem:   Near syncope Active Problems:   COPD GOLD II   Hypertension   ICD (implantable cardioverter-defibrillator), biventricular, in situ   Pulmonary hypertension (HCC)   Status post minimally invasive mitral valve replacement with bioprosthetic valve   Chronic respiratory failure assoc with chf/ PAH   Chronic combined systolic and diastolic CHF (congestive heart failure) (HCC)   Syncope   1. Near syncope. [name] having a complex past medical history, presenting to the emergency department after having an episode of dizziness/lightheadedness that occurred at a local restaurant. He denied worsening shortness of breath, chest pain, fevers, chills associated with this event. An EKG performed in the emergency department revealed a flutter without evidence for acute ischemic changes. Ventricular rates in the 38s. Unclear etiology of his near syncope event. Vitals in the emergency department were stable, had blood pressure 122/70, temp of 97.6, respiratory rate of 17, satting 92% on 2 L supplemental oxygen. Having complex past medical history it would be prudent to keep him overnight under observation with continuous cardiac monitoring. Will check orthostatics, cycle troponins, repeat EKG in  a.m. 2. Chronic systolic congestive heart failure. Last transthoracic echocardiogram performed on 08/13/2013 that showed EF in the range of 30-35%. Status post AICD placement. He is currently receiving his care at the cardiology clinic, following Dr. Ron Parker. On presentation appeared volume overloaded for which she was given 60 mg of IV Lasix 1. Plan to monitor ins and outs, continue 40 mg by mouth daily.  3. History of permanent atrial fibrillation, CHADSVasc score of 6. He was previously on Coumadin therapy which was complicated by intracerebral bleed with an INR of 2.3. Has undergone AV node ablation. Continue Coreg 3.125 mg by mouth twice a day 4. History of bacterial endocarditis. This was complicated by severe mitral regurgitation undergoing porcine mitral valve replacement 5. History of coronary artery disease, status post myocardial infarction in 2009. EKG performed in the emergency department did not reveal acute ischemic changes. Troponin undetectable. He presents with near syncope, plan to cycle troponins overnight. 6. History of non-small cell lung cancer status post right resection of left upper lobe in May 2015. A CT scan performed on 06/22/2015 did not reveal evidence of disease recurrence 7. Type 2 diabetes mellitus. Lab work in the emergency department revealed a glucose of 216. Unlikely that hypoglycemia may have accounted for patient's symptoms. Continue metformin. Accu-Cheks before meals and at bedtime with slight scale coverage.   Code Status: Full code DVT Prophylaxis: Lovenox Family Communication: Spoke with patient's wife was present at bedside Disposition Plan: Will place in overnight observation  Time spent: 55 min  Kelvin Cellar [facility] Pager [phone]

## 2015-09-24 NOTE — ED Notes (Signed)
Bed: WA17 Expected date:  Expected time:  Means of arrival:  Comments: Tr2

## 2015-09-24 NOTE — ED Notes (Signed)
Pt is aware of urine sample 

## 2015-09-25 ENCOUNTER — Observation Stay (HOSPITAL_COMMUNITY): Payer: Medicare Other

## 2015-09-25 ENCOUNTER — Encounter: Payer: Self-pay | Admitting: Cardiology

## 2015-09-25 DIAGNOSIS — R0602 Shortness of breath: Secondary | ICD-10-CM | POA: Diagnosis not present

## 2015-09-25 DIAGNOSIS — I10 Essential (primary) hypertension: Secondary | ICD-10-CM

## 2015-09-25 DIAGNOSIS — I951 Orthostatic hypotension: Secondary | ICD-10-CM

## 2015-09-25 DIAGNOSIS — R55 Syncope and collapse: Secondary | ICD-10-CM | POA: Diagnosis not present

## 2015-09-25 DIAGNOSIS — I5042 Chronic combined systolic (congestive) and diastolic (congestive) heart failure: Secondary | ICD-10-CM | POA: Diagnosis not present

## 2015-09-25 DIAGNOSIS — J449 Chronic obstructive pulmonary disease, unspecified: Secondary | ICD-10-CM | POA: Diagnosis not present

## 2015-09-25 DIAGNOSIS — I482 Chronic atrial fibrillation: Secondary | ICD-10-CM | POA: Diagnosis not present

## 2015-09-25 DIAGNOSIS — J9610 Chronic respiratory failure, unspecified whether with hypoxia or hypercapnia: Secondary | ICD-10-CM | POA: Diagnosis not present

## 2015-09-25 LAB — CUP PACEART REMOTE DEVICE CHECK
Battery Remaining Longevity: 58 mo
Battery Remaining Percentage: 61 %
Date Time Interrogation Session: 20170221070018
HIGH POWER IMPEDANCE MEASURED VALUE: 79 Ohm
HIGH POWER IMPEDANCE MEASURED VALUE: 79 Ohm
Implantable Lead Implant Date: 20100113
Implantable Lead Location: 753858
Implantable Lead Model: 1158
Lead Channel Impedance Value: 410 Ohm
Lead Channel Impedance Value: 940 Ohm
Lead Channel Pacing Threshold Amplitude: 0.625 V
Lead Channel Pacing Threshold Amplitude: 1.125 V
Lead Channel Pacing Threshold Pulse Width: 0.5 ms
Lead Channel Setting Pacing Amplitude: 2.125
Lead Channel Setting Pacing Pulse Width: 0.5 ms
Lead Channel Setting Pacing Pulse Width: 0.5 ms
MDC IDC LEAD IMPLANT DT: 20100113
MDC IDC LEAD LOCATION: 753860
MDC IDC LEAD MODEL: 7122
MDC IDC MSMT BATTERY VOLTAGE: 2.95 V
MDC IDC MSMT LEADCHNL LV PACING THRESHOLD PULSEWIDTH: 0.5 ms
MDC IDC MSMT LEADCHNL RV SENSING INTR AMPL: 11.9 mV
MDC IDC SET LEADCHNL RV PACING AMPLITUDE: 2 V
MDC IDC SET LEADCHNL RV SENSING SENSITIVITY: 0.5 mV
Pulse Gen Serial Number: 7053988

## 2015-09-25 LAB — CBC
HCT: 47.7 % (ref 39.0–52.0)
Hemoglobin: 15.2 g/dL (ref 13.0–17.0)
MCH: 31.5 pg (ref 26.0–34.0)
MCHC: 31.9 g/dL (ref 30.0–36.0)
MCV: 98.8 fL (ref 78.0–100.0)
PLATELETS: 163 10*3/uL (ref 150–400)
RBC: 4.83 MIL/uL (ref 4.22–5.81)
RDW: 15.2 % (ref 11.5–15.5)
WBC: 9.3 10*3/uL (ref 4.0–10.5)

## 2015-09-25 LAB — BASIC METABOLIC PANEL
Anion gap: 10 (ref 5–15)
BUN: 20 mg/dL (ref 6–20)
CALCIUM: 9 mg/dL (ref 8.9–10.3)
CO2: 31 mmol/L (ref 22–32)
CREATININE: 0.95 mg/dL (ref 0.61–1.24)
Chloride: 99 mmol/L — ABNORMAL LOW (ref 101–111)
GFR calc Af Amer: >60 mL/min (ref >60)
GFR calc non Af Amer: >60 mL/min (ref >60)
GLUCOSE: 174 mg/dL — AB (ref 65–99)
Potassium: 4.2 mmol/L (ref 3.5–5.1)
Sodium: 140 mmol/L (ref 135–145)

## 2015-09-25 LAB — GLUCOSE, CAPILLARY
GLUCOSE-CAPILLARY: 176 mg/dL — AB (ref 65–99)
Glucose-Capillary: 166 mg/dL — ABNORMAL HIGH (ref 65–99)
Glucose-Capillary: 171 mg/dL — ABNORMAL HIGH (ref 65–99)
Glucose-Capillary: 157 mg/dL — ABNORMAL HIGH (ref 65–99)

## 2015-09-25 LAB — TROPONIN I: Troponin I: <0.03 ng/mL (ref <0.031)

## 2015-09-25 MED ORDER — SODIUM CHLORIDE 0.9 % IV BOLUS (SEPSIS)
500.0000 mL | Freq: Once | INTRAVENOUS | Status: AC
Start: 2015-09-25 — End: 2015-09-25
  Administered 2015-09-25: 500 mL via INTRAVENOUS

## 2015-09-25 NOTE — Care Management Obs Status (Signed)
[facility] NOTIFICATION   Patient Details  Name: NIKASH MORTENSEN MRN: [id number] Date of Birth: [birth year]   Medicare Observation Status Notification Given:  Yes    MahabirJuliann Pulse, RN 09/25/2015, 2:00 PM

## 2015-09-25 NOTE — Progress Notes (Signed)
[facility] PROGRESS NOTE  [name] HYW:[id number] DOB: 1945/09/05 DOA: 09/24/2015 PCP: [name], NP  Assessment/Plan: 1. Orthostatic hypotension. -[name] presented with complaints of near syncope. On initial evaluation in the emergency room he was given IV Lasix as ED provider felt he could be volume overloaded. -He reported worsening dizziness from following day as he had a 20 mmHg drop in his systolic blood pressure from laying to standing. -I suspect that orthostasis is the cause of his near syncope -Will hold Lasix and provide a 500 mL bolus of NS.  -Repeat orthostatic signs in AM  2.  Chronic systolic congestive heart failure. -Last transthoracic echocardiogram performed on 09/09/2013 that showed EF of 30-35%. -He is orthostatic and continues to complain of dizziness with standing. -Have discontinued oral Lasix and will provide a 500 mL bolus of normal saline.  3.  History of permanent atrial fibrillation -Has a CHADSVasc score of 6. -Previously on anticoagulation which was discontinued due to intracerebral bleed. -Continue Coreg 3.125 Miller as by mouth twice a day  4.  Coronary artery disease. -EKG did not reveal acute ischemic changes. -Opponens cycled and remained negative 3 sets. -He presently denies chest pain or shortness of breath. I suspect dizziness is related to orthostatic hypotension  5.  Type 2 diabetes mellitus -Sugars controlled, continue metformin 1000 mg by mouth twice a day  6.  History of hypertension. -Systolic blood pressures coming down to 89 with standing. -He complains of worsening dizziness today. -Will discontinue ARB therapy  Code Status: Full code Family Communication:  Disposition Plan: Urine bolus of normal saline and stopping diuretic therapy   HPI/Subjective: [name] is a pleasant 70 year old gentleman with multiple comorbidities including chronic systolic congestive heart failure having an ejection fraction of 30-35%  presented to the emergency room with complaints of near syncope. On presentation he was given 60 mg of IV Lasix 1 in the emergency department. He was placed in overnight observation with continuous cardiac monitoring. Troponins were cycled and remained negative 3 sets. He was found to be orthostatic. By the following day reported increasing dizziness with standing feeling like he was going to "pass out". Repeat orthostatics the following day revealed 20 mmHg drop in systolic blood pressure going from laying to standing. His Lasix was held as he was given a 500 mL bolus of normal saline.  Objective: Filed Vitals:   09/25/15 1214 09/25/15 1241  BP: 102/56 102/56  Pulse:  72  Temp: 97.6 F (36.4 C)   Resp: 16     Intake/Output Summary (Last 24 hours) at 09/25/15 1312 Last data filed at 09/25/15 [id number]  Gross per 24 hour  Intake      0 ml  Output   1350 ml  Net  -1350 ml   Filed Weights   09/24/15 1312 09/24/15 1724  Weight: 95.709 kg (211 lb) 93.6 kg (206 lb 5.6 oz)    Exam:   General:  No acute distress awake and alert, reading dizziness upon standing  Cardiovascular: Irregular rate and rhythm normal S1-S2  Respiratory: Normal respiratory effort  Abdomen: Soft nontender nondistended  Musculoskeletal: No edema  Data Reviewed: Basic Metabolic Panel:  Recent Labs Lab 09/24/15 1350 09/25/15 0817  NA 139 140  K 4.2 4.2  CL 101 99*  CO2 25 31  GLUCOSE 216* 174*  BUN 20 20  CREATININE 0.90 0.95  CALCIUM 9.1 9.0   Liver Function Tests: No results for input(s): AST, ALT, ALKPHOS, BILITOT, PROT, ALBUMIN in the  last 168 hours. No results for input(s): LIPASE, AMYLASE in the last 168 hours. No results for input(s): AMMONIA in the last 168 hours. CBC:  Recent Labs Lab 09/24/15 1350 09/25/15 0817  WBC 9.9 9.3  HGB 15.5 15.2  HCT 48.4 47.7  MCV 97.8 98.8  PLT 184 163   Cardiac Enzymes:  Recent Labs Lab 09/24/15 1811 09/25/15 0111 09/25/15 0817  TROPONINI  <0.03 <0.03 <0.03   BNP (last 3 results)  Recent Labs  09/24/15 1350  BNP 285.0*    ProBNP (last 3 results) No results for input(s): PROBNP in the last 8760 hours.  CBG:  Recent Labs Lab 09/24/15 1419 09/24/15 1827 09/24/15 2140 09/25/15 0735 09/25/15 1141  GLUCAP 198* 225* 167* 166* 176*    No results found for this or any previous visit (from the past 240 hour(s)).   Studies: Dg Chest 2 View  09/25/2015  CLINICAL DATA:  70 year old male with history of pneumonia and shortness of breath. EXAM: CHEST  2 VIEW COMPARISON:  Chest x-ray 09/24/2015. FINDINGS: Elevation of left hemidiaphragm is unchanged. Diffuse peribronchial cuffing and interstitial prominence, similar to the prior examination. The ill-defined opacities in the lung bases bilaterally favored to predominantly reflect subsegmental atelectasis, although areas of airspace consolidation are not excluded. Pulmonary vascular engorgement. Mild cardiomegaly. Upper mediastinal contours are within normal limits allowing for patient positioning. Right-sided biventricular pacemaker/AICD with lead tips projecting over the expected location of the right ventricular apex and overlying the lateral wall the left ventricle via the coronary sinus and coronary veins. Marker in place suggestive of prior mitral valve replacement. IMPRESSION: 1. Findings suggest mild congestive heart failure, as above. 2. Additional bibasilar opacities are predominantly favored to reflect subsegmental atelectasis, although underlying airspace consolidation from infection or aspiration in the lung bases is not entirely excluded. 3. Elevation of left hemidiaphragm. Electronically Signed   By: Vinnie Langton M.D.   On: 09/25/2015 09:18   Dg Chest 2 View  09/24/2015  CLINICAL DATA:  Weakness and dizziness EXAM: CHEST  2 VIEW COMPARISON:  06/22/2015 FINDINGS: Cardiac shadow is within normal limits. A defibrillator is again noted. The lungs show patchy bibasilar  infiltrates. No sizable effusion is seen. Elevation of left hemidiaphragm is noted. IMPRESSION: Patchy bibasilar infiltrates. Electronically Signed   By: Inez Catalina M.D.   On: 09/24/2015 15:01   Ct Head Wo Contrast  09/24/2015  CLINICAL DATA:  Dizziness today at lunch Lung ca dx 2015 Left lung surgery only Hx COPD, CVA, EXAM: CT HEAD WITHOUT CONTRAST TECHNIQUE: Contiguous axial images were obtained from the base of the skull through the vertex without intravenous contrast. COMPARISON:  11/22/2013 FINDINGS: There is central and cortical atrophy. Periventricular white matter changes are consistent with small vessel disease. Remote cortical infarcts involve the left posterior temporal lobe and the right posterior parietal lobe. There is no intra or extra-axial fluid collection or mass lesion. The basilar cisterns and ventricles have a normal appearance. There is no CT evidence for acute infarction or hemorrhage. Bone windows show no acute findings. There is atherosclerotic calcification of the internal carotid arteries. IMPRESSION: 1. Atrophy, small vessel disease. 2. Stable appearance of remote cortical infarcts bilaterally. 3.  No evidence for acute intracranial abnormality. Electronically Signed   By: Nolon Nations M.D.   On: 09/24/2015 16:25    Scheduled Meds: . atorvastatin  10 mg Oral Daily  . carvedilol  3.125 mg Oral BID WC  . enoxaparin (LOVENOX) injection  40 mg Subcutaneous Q24H  .  insulin aspart  0-15 Units Subcutaneous TID WC  . insulin aspart  0-5 Units Subcutaneous QHS  . irbesartan  75 mg Oral Daily  . metFORMIN  1,000 mg Oral BID WC  . mometasone-formoterol  2 puff Inhalation BID  . sodium chloride  500 mL Intravenous Once  . sodium chloride flush  3 mL Intravenous Q12H  . sodium chloride flush  3 mL Intravenous Q12H   Continuous Infusions:   Principal Problem:   Near syncope Active Problems:   COPD GOLD II   Hypertension   ICD (implantable cardioverter-defibrillator),  biventricular, in situ   Pulmonary hypertension (HCC)   Status post minimally invasive mitral valve replacement with bioprosthetic valve   Chronic respiratory failure assoc with chf/ PAH   Chronic combined systolic and diastolic CHF (congestive heart failure) ([place])   Syncope    Time spent: 30 min    Kelvin Cellar  [facility] Pager [phone]. If 7PM-7AM, please contact night-coverage at [email or web address], password [facility] 09/25/2015, 1:12 PM

## 2015-09-25 NOTE — Care Management Note (Signed)
Case Management Note  Patient Details  Name: [name] MRN: [id number] Date of Birth: 1946-05-10  Subjective/Objective: 70 y/o m admitted w/Near syncope. From home. Has home 02-[facility]-has travel tank.PT cons-await recc.                   Action/Plan:d/c plan home.   Expected Discharge Date:   (unknown)               Expected Discharge Plan:  Home/Self Care  In-House Referral:     Discharge planning Services  CM Consult  Post Acute Care Choice:  Durable Medical Equipment [facility] home 02-has travel tank.) Choice offered to:     DME Arranged:    DME Agency:     HH Arranged:    HH Agency:     Status of Service:  In process, will continue to follow  Medicare Important Message Given:    Date Medicare IM Given:    Medicare IM give by:    Date Additional Medicare IM Given:    Additional Medicare Important Message give by:     If discussed at [place] of Stay Meetings, dates discussed:    Additional Comments:  Dessa Phi, RN 09/25/2015, 2:00 PM

## 2015-09-26 DIAGNOSIS — J9610 Chronic respiratory failure, unspecified whether with hypoxia or hypercapnia: Secondary | ICD-10-CM | POA: Diagnosis not present

## 2015-09-26 DIAGNOSIS — I482 Chronic atrial fibrillation: Secondary | ICD-10-CM | POA: Diagnosis not present

## 2015-09-26 DIAGNOSIS — R55 Syncope and collapse: Secondary | ICD-10-CM | POA: Diagnosis not present

## 2015-09-26 DIAGNOSIS — I951 Orthostatic hypotension: Secondary | ICD-10-CM | POA: Diagnosis present

## 2015-09-26 DIAGNOSIS — I5042 Chronic combined systolic (congestive) and diastolic (congestive) heart failure: Secondary | ICD-10-CM | POA: Diagnosis not present

## 2015-09-26 DIAGNOSIS — J449 Chronic obstructive pulmonary disease, unspecified: Secondary | ICD-10-CM | POA: Diagnosis not present

## 2015-09-26 LAB — GLUCOSE, CAPILLARY
GLUCOSE-CAPILLARY: 123 mg/dL — AB (ref 65–99)
Glucose-Capillary: 143 mg/dL — ABNORMAL HIGH (ref 65–99)
Glucose-Capillary: 138 mg/dL — ABNORMAL HIGH (ref 65–99)
Glucose-Capillary: 119 mg/dL — ABNORMAL HIGH (ref 65–99)

## 2015-09-26 LAB — TROPONIN I

## 2015-09-26 MED ORDER — NITROGLYCERIN 0.4 MG SL SUBL
0.4000 mg | SUBLINGUAL_TABLET | SUBLINGUAL | Status: DC | PRN
  Filled 2015-09-26: qty 25

## 2015-09-26 MED ORDER — SODIUM CHLORIDE 0.9 % IV BOLUS (SEPSIS)
250.0000 mL | Freq: Once | INTRAVENOUS | Status: AC
  Administered 2015-09-26: 250 mL via INTRAVENOUS

## 2015-09-26 NOTE — Evaluation (Signed)
Physical Therapy Evaluation Patient Details Name: [name] MRN: [id number] DOB: 10-24-1945 Today's Date: 09/26/2015   History of Present Illness  70 yo male admitted with near syncope, +orthostatic hypotension. hx of MI, pacemaker, CVA, DM, endocarditis, Aib, HTN, CHF, CAD.   Clinical Impression  On eval, pt required Min assist +2 for safety for mobility. Pt was able to walk ~35'x2 with RW. + dizziness, feeling unsteady throughout session. BP sitting: 110/63, standing: 105/55, after walking: 116/66.  Will continue to follow and progress activity as able. Recommend ambulation with RW and nursing supervision/assist during stay. Recommend HHPT follow up. At this time, pt will require 24 hour supervision/assist.     Follow Up Recommendations Home health PT;Supervision/Assistance - 24 hour    Equipment Recommendations  Rolling walker with 5" wheels    Recommendations for Other Services       Precautions / Restrictions Precautions Precautions: Fall Precaution Comments: monitor BP Restrictions Weight Bearing Restrictions: No      Mobility  Bed Mobility               General bed mobility comments: oob in recliner  Transfers Overall transfer level: Needs assistance Equipment used: None Transfers: Sit to/from Stand Sit to Stand: Min assist         General transfer comment: Assist to steady. Stood for several minutes to assess BP and allow dizziness to resolve.   Ambulation/Gait Ambulation/Gait assistance: Min assist;+2 safety/equipment Ambulation Distance (Feet): 35 Feet (x2) Assistive device: Rolling walker (2 wheeled) Gait Pattern/deviations: Step-through pattern;Decreased stride length     General Gait Details: Assist to stabilize pt. slow gait speed. unsteady. Seated break needed due to dizziness, pt feeling unsteady.   Stairs            Wheelchair Mobility    Modified Rankin (Stroke Patients Only)       Balance Overall balance assessment:  Needs assistance         Standing balance support: Bilateral upper extremity supported;During functional activity Standing balance-Leahy Scale: Fair                               Pertinent Vitals/Pain Pain Assessment: No/denies pain    Home Living Family/patient expects to be discharged to:: Private residence Living Arrangements: Spouse/significant other   Type of Home: House Home Access: Stairs to enter   Technical brewer of Steps: 2 Home Layout: One level Home Equipment: None      Prior Function Level of Independence: Independent               Hand Dominance        Extremity/Trunk Assessment   Upper Extremity Assessment: Generalized weakness           Lower Extremity Assessment: Generalized weakness      Cervical / Trunk Assessment: Normal  Communication   Communication: No difficulties  Cognition Arousal/Alertness: Awake/alert Behavior During Therapy: WFL for tasks assessed/performed Overall Cognitive Status: Within Functional Limits for tasks assessed                      General Comments      Exercises        Assessment/Plan    PT Assessment Patient needs continued PT services  PT Diagnosis Difficulty walking;Generalized weakness   PT Problem List Decreased strength;Decreased activity tolerance;Decreased balance;Decreased mobility;Decreased knowledge of use of DME  PT Treatment Interventions DME instruction;Gait training;Functional mobility training;Therapeutic activities;Wheelchair  mobility training;Balance training   PT Goals (Current goals can be found in the Care Plan section) Acute Rehab PT Goals Patient Stated Goal: get rid of dizziness. find out why it is occurring PT Goal Formulation: With patient Time For Goal Achievement: 10/10/15 Potential to Achieve Goals: Good    Frequency Min 3X/week   Barriers to discharge        Co-evaluation               End of Session Equipment Utilized  During Treatment: Gait belt Activity Tolerance:  (Limited by weakness, dizziness) Patient left: in chair;with call bell/phone within reach;with chair alarm set;with family/visitor present      Functional Assessment Tool Used: clinical judgement Functional Limitation: Mobility: Walking and moving around Mobility: Walking and Moving Around Current Status [phone]): At least 20 percent but less than 40 percent impaired, limited or restricted Mobility: Walking and Moving Around Goal Status [phone]): At least 1 percent but less than 20 percent impaired, limited or restricted    Time: 1421-1439 PT Time Calculation (min) (ACUTE ONLY): 18 min   Charges:   PT Evaluation $PT Eval Moderate Complexity: 1 Procedure     PT G Codes:   PT G-Codes **NOT FOR INPATIENT CLASS** Functional Assessment Tool Used: clinical judgement Functional Limitation: Mobility: Walking and moving around Mobility: Walking and Moving Around Current Status ([id number]): At least 20 percent but less than 40 percent impaired, limited or restricted Mobility: Walking and Moving Around Goal Status [phone]): At least 1 percent but less than 20 percent impaired, limited or restricted    Weston Anna, MPT Pager: [phone]

## 2015-09-26 NOTE — Progress Notes (Signed)
[facility] PROGRESS NOTE  PHYLLIS WHITEFIELD UKG:[id number] DOB: 10/27/1945 DOA: 09/24/2015 PCP: [name], NP  Assessment/Plan: 1. Orthostatic hypotension. -[name] presented with complaints of near syncope. On initial evaluation in the emergency room he was given IV Lasix as ED provider felt he could be volume overloaded. -He reported worsening dizziness from following day as he had a 20 mmHg drop in his systolic blood pressure from laying to standing. -I suspect that orthostasis is the cause of his near syncope -He was given a 500 mL bolus of normal saline on 09/25/2015 -Repeat orthostatics performed on 09/26/2015 continuing to show a > than 20 mmHg drop in his systolic blood pressure from laying to standing (130 to 105), that is associate with dizziness/lightheadedness. -Provide a 250 mL bolus of NS and watch volume status closely today.  2.  Chronic systolic congestive heart failure. -Last transthoracic echocardiogram performed on 09/09/2013 that showed EF of 30-35%. -He is orthostatic and continues to complain of dizziness with standing. -Discontinued oral Lasix and provided a 500 mL bolus of normal saline on 09/25/2015. -Repeat orthostatics on 09/26/2015 showing > 20 mmHg drop in his systolic blood pressures laying to standing L is associate with dizziness/lightheadedness. On this date I gave him an additional 250 mL bolus of normal saline. -Will need to monitor volume status closely given his reduced ejection fraction  3.  History of permanent atrial fibrillation -Has a CHADSVasc score of 6. -Previously on anticoagulation which was discontinued due to intracerebral bleed. -Continue Coreg 3.125 mg as by mouth twice a day  4.  Coronary artery disease. -EKG did not reveal acute ischemic changes. -Opponens cycled and remained negative 3 sets. -He presently denies chest pain or shortness of breath. I suspect dizziness is related to orthostatic hypotension  5.  Type 2 diabetes  mellitus -Sugars controlled, continue metformin 1000 mg by mouth twice a day  6.  History of hypertension. -Systolic blood pressures coming down to 89 with standing. -He complains of worsening dizziness today. -Discontinued ARB therapy  Code Status: Full code Family Communication:  Disposition Plan: Urine bolus of normal saline and stopping diuretic therapy   HPI/Subjective: [name] is a pleasant 70 year old gentleman with multiple comorbidities including chronic systolic congestive heart failure having an ejection fraction of 30-35% presented to the emergency room with complaints of near syncope. On presentation he was given 60 mg of IV Lasix 1 in the emergency department. He was placed in overnight observation with continuous cardiac monitoring. Troponins were cycled and remained negative 3 sets. He was found to be orthostatic. By the following day reported increasing dizziness with standing feeling like he was going to "pass out". Repeat orthostatics the following day revealed 20 mmHg drop in systolic blood pressure going from laying to standing. His Lasix was held as he was given a 500 mL bolus of normal saline.  Objective: Filed Vitals:   09/26/15 0604 09/26/15 0825  BP: 112/64 128/70  Pulse: 74 74  Temp: 98 F (36.7 C)   Resp: 16     Intake/Output Summary (Last 24 hours) at 09/26/15 0922 Last data filed at 09/26/15 0846  Gross per 24 hour  Intake      0 ml  Output    875 ml  Net   -875 ml   Filed Weights   09/24/15 1312 09/24/15 1724  Weight: 95.709 kg (211 lb) 93.6 kg (206 lb 5.6 oz)    Exam:   General:  He is sitting at bedside chair, appears  comfortable however reports dizziness/lightheadedness upon standing.  Cardiovascular: Irregular rate and rhythm normal S1-S2  Respiratory: Normal respiratory effort  Abdomen: Soft nontender nondistended  Musculoskeletal: No edema  Data Reviewed: Basic Metabolic Panel:  Recent Labs Lab 09/24/15 1350  09/25/15 0817  NA 139 140  K 4.2 4.2  CL 101 99*  CO2 25 31  GLUCOSE 216* 174*  BUN 20 20  CREATININE 0.90 0.95  CALCIUM 9.1 9.0   Liver Function Tests: No results for input(s): AST, ALT, ALKPHOS, BILITOT, PROT, ALBUMIN in the last 168 hours. No results for input(s): LIPASE, AMYLASE in the last 168 hours. No results for input(s): AMMONIA in the last 168 hours. CBC:  Recent Labs Lab 09/24/15 1350 09/25/15 0817  WBC 9.9 9.3  HGB 15.5 15.2  HCT 48.4 47.7  MCV 97.8 98.8  PLT 184 163   Cardiac Enzymes:  Recent Labs Lab 09/24/15 1811 09/25/15 0111 09/25/15 0817  TROPONINI <0.03 <0.03 <0.03   BNP (last 3 results)  Recent Labs  09/24/15 1350  BNP 285.0*    ProBNP (last 3 results) No results for input(s): PROBNP in the last 8760 hours.  CBG:  Recent Labs Lab 09/24/15 2140 09/25/15 0735 09/25/15 1141 09/25/15 1714 09/25/15 2128  GLUCAP 167* 166* 176* 171* 157*    No results found for this or any previous visit (from the past 240 hour(s)).   Studies: Dg Chest 2 View  09/25/2015  CLINICAL DATA:  70 year old male with history of pneumonia and shortness of breath. EXAM: CHEST  2 VIEW COMPARISON:  Chest x-ray 09/24/2015. FINDINGS: Elevation of left hemidiaphragm is unchanged. Diffuse peribronchial cuffing and interstitial prominence, similar to the prior examination. The ill-defined opacities in the lung bases bilaterally favored to predominantly reflect subsegmental atelectasis, although areas of airspace consolidation are not excluded. Pulmonary vascular engorgement. Mild cardiomegaly. Upper mediastinal contours are within normal limits allowing for patient positioning. Right-sided biventricular pacemaker/AICD with lead tips projecting over the expected location of the right ventricular apex and overlying the lateral wall the left ventricle via the coronary sinus and coronary veins. Marker in place suggestive of prior mitral valve replacement. IMPRESSION: 1. Findings  suggest mild congestive heart failure, as above. 2. Additional bibasilar opacities are predominantly favored to reflect subsegmental atelectasis, although underlying airspace consolidation from infection or aspiration in the lung bases is not entirely excluded. 3. Elevation of left hemidiaphragm. Electronically Signed   By: Vinnie Langton M.D.   On: 09/25/2015 09:18   Dg Chest 2 View  09/24/2015  CLINICAL DATA:  Weakness and dizziness EXAM: CHEST  2 VIEW COMPARISON:  06/22/2015 FINDINGS: Cardiac shadow is within normal limits. A defibrillator is again noted. The lungs show patchy bibasilar infiltrates. No sizable effusion is seen. Elevation of left hemidiaphragm is noted. IMPRESSION: Patchy bibasilar infiltrates. Electronically Signed   By: Inez Catalina M.D.   On: 09/24/2015 15:01   Ct Head Wo Contrast  09/24/2015  CLINICAL DATA:  Dizziness today at lunch Lung ca dx 2015 Left lung surgery only Hx COPD, CVA, EXAM: CT HEAD WITHOUT CONTRAST TECHNIQUE: Contiguous axial images were obtained from the base of the skull through the vertex without intravenous contrast. COMPARISON:  11/22/2013 FINDINGS: There is central and cortical atrophy. Periventricular white matter changes are consistent with small vessel disease. Remote cortical infarcts involve the left posterior temporal lobe and the right posterior parietal lobe. There is no intra or extra-axial fluid collection or mass lesion. The basilar cisterns and ventricles have a normal appearance. There is no  CT evidence for acute infarction or hemorrhage. Bone windows show no acute findings. There is atherosclerotic calcification of the internal carotid arteries. IMPRESSION: 1. Atrophy, small vessel disease. 2. Stable appearance of remote cortical infarcts bilaterally. 3.  No evidence for acute intracranial abnormality. Electronically Signed   By: Nolon Nations M.D.   On: 09/24/2015 16:25    Scheduled Meds: . atorvastatin  10 mg Oral Daily  . carvedilol   3.125 mg Oral BID WC  . enoxaparin (LOVENOX) injection  40 mg Subcutaneous Q24H  . insulin aspart  0-15 Units Subcutaneous TID WC  . insulin aspart  0-5 Units Subcutaneous QHS  . metFORMIN  1,000 mg Oral BID WC  . mometasone-formoterol  2 puff Inhalation BID  . sodium chloride flush  3 mL Intravenous Q12H  . sodium chloride flush  3 mL Intravenous Q12H   Continuous Infusions:   Principal Problem:   Near syncope Active Problems:   COPD GOLD II   Hypertension   ICD (implantable cardioverter-defibrillator), biventricular, in situ   Pulmonary hypertension (HCC)   Status post minimally invasive mitral valve replacement with bioprosthetic valve   Chronic respiratory failure assoc with chf/ PAH   Chronic combined systolic and diastolic CHF (congestive heart failure) ([place])   Syncope    Time spent: 30 min    Kelvin Cellar  [facility] Pager [phone]. If 7PM-7AM, please contact night-coverage at [email or web address], password [facility] 09/26/2015, 9:22 AM

## 2015-09-27 DIAGNOSIS — R55 Syncope and collapse: Secondary | ICD-10-CM | POA: Diagnosis not present

## 2015-09-27 DIAGNOSIS — I951 Orthostatic hypotension: Secondary | ICD-10-CM | POA: Diagnosis not present

## 2015-09-27 LAB — BASIC METABOLIC PANEL
ANION GAP: 9 (ref 5–15)
BUN: 21 mg/dL — ABNORMAL HIGH (ref 6–20)
CO2: 28 mmol/L (ref 22–32)
Calcium: 9.1 mg/dL (ref 8.9–10.3)
Chloride: 100 mmol/L — ABNORMAL LOW (ref 101–111)
Creatinine, Ser: 0.74 mg/dL (ref 0.61–1.24)
Glucose, Bld: 162 mg/dL — ABNORMAL HIGH (ref 65–99)
POTASSIUM: 4.5 mmol/L (ref 3.5–5.1)
SODIUM: 137 mmol/L (ref 135–145)

## 2015-09-27 LAB — CBC
HEMATOCRIT: 46.4 % (ref 39.0–52.0)
HEMOGLOBIN: 14.7 g/dL (ref 13.0–17.0)
MCH: 31.1 pg (ref 26.0–34.0)
MCHC: 31.7 g/dL (ref 30.0–36.0)
MCV: 98.1 fL (ref 78.0–100.0)
Platelets: 166 10*3/uL (ref 150–400)
RBC: 4.73 MIL/uL (ref 4.22–5.81)
RDW: 15.4 % (ref 11.5–15.5)
WBC: 7 10*3/uL (ref 4.0–10.5)

## 2015-09-27 LAB — GLUCOSE, CAPILLARY
GLUCOSE-CAPILLARY: 159 mg/dL — AB (ref 65–99)
GLUCOSE-CAPILLARY: 157 mg/dL — AB (ref 65–99)

## 2015-09-27 NOTE — Discharge Summary (Signed)
Physician Discharge Summary  [name]:[id number] DOB: 1946-07-09 DOA: 09/24/2015  PCP: [name], NP  Admit date: 09/24/2015 Discharge date: 09/27/2015  Time spent: 35 minutes  Recommendations for Outpatient Follow-up:  1. Please follow-up on orthostatics, he was admitted for near syncope likely related to orthostatic hypotension. During this hospitalization his Lasix was held. 2. He was given a follow-up upon with cardiology 3. Prior to discharge he was set up with home health services for home RN and physical therapy   Discharge Diagnoses:  Principal Problem:   Near syncope Active Problems:   Orthostatic hypotension   COPD GOLD II   Hypertension   ICD (implantable cardioverter-defibrillator), biventricular, in situ   Pulmonary hypertension (HCC)   Status post minimally invasive mitral valve replacement with bioprosthetic valve   Chronic respiratory failure assoc with chf/ PAH   Chronic combined systolic and diastolic CHF (congestive heart failure) ([place])   Syncope   Discharge Condition: Stable  Diet recommendation: Heart healthy  Filed Weights   09/24/15 1312 09/24/15 1724 09/27/15 0528  Weight: 95.709 kg (211 lb) 93.6 kg (206 lb 5.6 oz) 93.6 kg (206 lb 5.6 oz)    History of present illness:  [name] is a 70 y.o. male with multiple comorbidities including having a history of endocarditis status post mitral valve replacement with Bob prostatic valve, history of atrial fibrillation, history of intracranial hemorrhage in setting of Coumadin therapy, status post AICD placement, chronic obstructive pulmonary disease, presented to the emergency department after having a near syncope event earlier in the day. [name] reports being in his usual state health, this afternoon went to [employer] with his wife, when he sat down at the [place] he had sided onset dizziness/lightheadedness feeling like he was about to pass out. His wife noted that he became pale and  diaphoretic. She called 911 immediately. He denied chest pain, shortness of breath, fevers, chills, cough, sputum production, abdominal pain, bloody stools, focal neurological deficits. In the emergency department he had a CT scan of brain which did not reveal acute intracranial abnormalities. Symptoms resolving by the time he reached the emergency department. He reports having a fall in his bathroom about 3 weeks ago which attributed to losing his balance. Denies any other recent falls.  Hospital Course:  [name] is a pleasant 70 year old gentleman with multiple comorbidities including chronic systolic congestive heart failure having an ejection fraction of 30-35% presented to the emergency room with complaints of near syncope. On presentation he was given 60 mg of IV Lasix 1 in the emergency department. He was placed in overnight observation with continuous cardiac monitoring. Troponins were cycled and remained negative 3 sets. He was found to be orthostatic. By the following day reported increasing dizziness with standing feeling like he was going to "pass out". Repeat orthostatics the following day revealed 20 mmHg drop in systolic blood pressure going from laying to standing. His Lasix was held as he was given a 500 mL bolus of normal saline.   Orthostatic hypotension. -[name] presented with complaints of near syncope. On initial evaluation in the emergency room he was given IV Lasix as ED provider felt he could be volume overloaded. -He reported worsening dizziness from following day as he had a 20 mmHg drop in his systolic blood pressure from laying to standing. -I suspect that orthostasis is the cause of his near syncope -He was given a 500 mL bolus of normal saline on 09/25/2015 -Repeat orthostatics performed on 09/26/2015 continuing  to show a > than 20 mmHg drop in his systolic blood pressure from laying to standing (130 to 105), that is associate with dizziness/lightheadedness. -Provided  a 250 mL bolus of NS on 09/26/2015 -On day of discharge patient orthostatic vitals were checked SBP improved 126-116 laying to standing, where previously had greater than 20 mmHg drop in SBPs.   2. Chronic systolic congestive heart failure. -Last transthoracic echocardiogram performed on 09/09/2013 that showed EF of 30-35%. -He is orthostatic and continues to complain of dizziness with standing. -Discontinued oral Lasix and provided a 500 mL bolus of normal saline on 09/25/2015. -Repeat orthostatics on 09/26/2015 showing > 20 mmHg drop in his systolic blood pressures laying to standing L is associate with dizziness/lightheadedness. On this date I gave him an additional 250 mL bolus of normal saline. -Please follow-up on volume status closely given his reduced ejection fraction  3. History of permanent atrial fibrillation -Has a CHADSVasc score of 6. -Remains rate controlled  -Previously on anticoagulation which was discontinued due to intracerebral bleed. -Continue Coreg 3.125 mg as by mouth twice a day  4. Coronary artery disease. -EKG did not reveal acute ischemic changes. -Opponens cycled and remained negative 3 sets. -He presently denies chest pain or shortness of breath. I suspect dizziness is related to orthostatic hypotension  5. Type 2 diabetes mellitus -Sugars controlled, continue metformin 1000 mg by mouth twice a day  6. History of hypertension. -Systolic blood pressures coming down to 89 with standing. -He complains of worsening dizziness  -Discontinued ARB therapy during this hospitalization, reassess the outpatient setting   Discharge Exam: Filed Vitals:   09/26/15 2217 09/27/15 0528  BP: 114/70 125/70  Pulse: 70 79  Temp: 98 F (36.7 C) 97.8 F (36.6 C)  Resp: 18 18     General: He is sitting at bedside chair, appears comfortable watching the NCAA tournament, in no acute distress  Cardiovascular: Irregular rate and rhythm normal S1-S2  Respiratory:  Normal respiratory effort  Abdomen: Soft nontender nondistended  Musculoskeletal: No edema  Discharge Instructions   Discharge Instructions    Call MD for:  difficulty breathing, headache or visual disturbances    Complete by:  As directed      Call MD for:  extreme fatigue    Complete by:  As directed      Call MD for:  hives    Complete by:  As directed      Call MD for:  persistant dizziness or light-headedness    Complete by:  As directed      Call MD for:  persistant nausea and vomiting    Complete by:  As directed      Call MD for:  redness, tenderness, or signs of infection (pain, swelling, redness, odor or green/yellow discharge around incision site)    Complete by:  As directed      Call MD for:  severe uncontrolled pain    Complete by:  As directed      Call MD for:  temperature >100.4    Complete by:  As directed      Call MD for:    Complete by:  As directed      Diet - low sodium heart healthy    Complete by:  As directed      Increase activity slowly    Complete by:  As directed           Current Discharge Medication List    CONTINUE these medications which  have NOT CHANGED   Details  aspirin EC 81 MG tablet Take 1 tablet (81 mg total) by mouth daily. Qty: 90 tablet, Refills: 3    atorvastatin (LIPITOR) 10 MG tablet TAKE 1 TABLET BY MOUTH EVERY DAY Qty: 90 tablet, Refills: 3    budesonide-formoterol (SYMBICORT) 160-4.5 MCG/ACT inhaler Inhale 2 puffs into the lungs 2 (two) times daily.    carvedilol (COREG) 3.125 MG tablet TAKE 1 TABLET BY MOUTH TWICE A DAY WITH MEALS Qty: 180 tablet, Refills: 1    furosemide (LASIX) 40 MG tablet Take 40 mg by mouth daily. May take 1 extra daily as needed for edema    nitroGLYCERIN (NITROSTAT) 0.4 MG SL tablet Place 1 tablet (0.4 mg total) under the tongue every 5 (five) minutes as needed. For chest pain Qty: 25 tablet, Refills: 1   Associated Diagnoses: CAD (coronary artery disease)    HYDROcodone-acetaminophen  (NORCO/VICODIN) 5-325 MG tablet Take 2 tablets by mouth every 4 (four) hours as needed. Qty: 10 tablet, Refills: 0    metFORMIN (GLUCOPHAGE) 1000 MG tablet Take 1 tablet (1,000 mg total) by mouth 2 (two) times daily with a meal. Qty: 60 tablet, Refills: 3   Associated Diagnoses: Type 2 diabetes mellitus without complication (HCC)      STOP taking these medications     amoxicillin (AMOXIL) 500 MG capsule      irbesartan (AVAPRO) 75 MG tablet      NON FORMULARY      ibuprofen (ADVIL,MOTRIN) 600 MG tablet        Allergies  Allergen Reactions  . Anticoagulant Compound Other (See Comments)    Pt had intracranial bleed, therefore all anticoagulation is contra-indicated per Dr. Ron Parker.  . Warfarin Sodium Other (See Comments)    Pt had intracranial bleed, therefore all anticoagulation is contra-indicated per Dr. Ron Parker.    Follow-up Information    Follow up with [name], NP In 1 week.   Specialty:  Family Medicine   Contact information:   [address] [place] [address] [phone]       Follow up with Dola Argyle, MD.   Specialty:  Cardiology   Contact information:   [address] [place] [address] [phone]        The results of significant diagnostics from this hospitalization (including imaging, microbiology, ancillary and laboratory) are listed below for reference.    Significant Diagnostic Studies: Dg Chest 2 View  09/25/2015  CLINICAL DATA:  70 year old male with history of pneumonia and shortness of breath. EXAM: CHEST  2 VIEW COMPARISON:  Chest x-ray 09/24/2015. FINDINGS: Elevation of left hemidiaphragm is unchanged. Diffuse peribronchial cuffing and interstitial prominence, similar to the prior examination. The ill-defined opacities in the lung bases bilaterally favored to predominantly reflect subsegmental atelectasis, although areas of airspace consolidation are not excluded. Pulmonary vascular engorgement. Mild  cardiomegaly. Upper mediastinal contours are within normal limits allowing for patient positioning. Right-sided biventricular pacemaker/AICD with lead tips projecting over the expected location of the right ventricular apex and overlying the lateral wall the left ventricle via the coronary sinus and coronary veins. Marker in place suggestive of prior mitral valve replacement. IMPRESSION: 1. Findings suggest mild congestive heart failure, as above. 2. Additional bibasilar opacities are predominantly favored to reflect subsegmental atelectasis, although underlying airspace consolidation from infection or aspiration in the lung bases is not entirely excluded. 3. Elevation of left hemidiaphragm. Electronically Signed   By: Vinnie Langton M.D.   On: 09/25/2015 09:18  Dg Chest 2 View  09/24/2015  CLINICAL DATA:  Weakness and dizziness EXAM: CHEST  2 VIEW COMPARISON:  06/22/2015 FINDINGS: Cardiac shadow is within normal limits. A defibrillator is again noted. The lungs show patchy bibasilar infiltrates. No sizable effusion is seen. Elevation of left hemidiaphragm is noted. IMPRESSION: Patchy bibasilar infiltrates. Electronically Signed   By: Inez Catalina M.D.   On: 09/24/2015 15:01   Dg Lumbar Spine Complete  09/02/2015  CLINICAL DATA:  The patient bent over and felt a pop in his low back 3 days ago with onset of pain. Initial encounter. EXAM: LUMBAR SPINE - COMPLETE 4+ VIEW COMPARISON:  CT abdomen and pelvis 06/07/2008. FINDINGS: No fracture is identified. There is marked multilevel loss of disc space height. The patient has bilateral L4 pars interarticularis defects with associated 0.7 cm anterolisthesis L4 on L5, unchanged since the prior CT. Paraspinous structures demonstrate extensive atherosclerosis. IMPRESSION: No acute abnormality. Advanced multilevel spondylosis. Bilateral L4 pars interarticularis defects with unchanged grade 1 anterolisthesis L4 on L5. Convex right scoliosis. Electronically Signed   By:  Inge Rise M.D.   On: 09/02/2015 15:22   Ct Head Wo Contrast  09/24/2015  CLINICAL DATA:  Dizziness today at lunch Lung ca dx 2015 Left lung surgery only Hx COPD, CVA, EXAM: CT HEAD WITHOUT CONTRAST TECHNIQUE: Contiguous axial images were obtained from the base of the skull through the vertex without intravenous contrast. COMPARISON:  11/22/2013 FINDINGS: There is central and cortical atrophy. Periventricular white matter changes are consistent with small vessel disease. Remote cortical infarcts involve the left posterior temporal lobe and the right posterior parietal lobe. There is no intra or extra-axial fluid collection or mass lesion. The basilar cisterns and ventricles have a normal appearance. There is no CT evidence for acute infarction or hemorrhage. Bone windows show no acute findings. There is atherosclerotic calcification of the internal carotid arteries. IMPRESSION: 1. Atrophy, small vessel disease. 2. Stable appearance of remote cortical infarcts bilaterally. 3.  No evidence for acute intracranial abnormality. Electronically Signed   By: Nolon Nations M.D.   On: 09/24/2015 16:25   Dg Hip Unilat With Pelvis 2-3 Views Right  09/02/2015  CLINICAL DATA:  70 year old male with acute onset lumbar back pain radiating to the posterior right hip. Initial encounter. EXAM: DG HIP (WITH OR WITHOUT PELVIS) 2-3V RIGHT COMPARISON:  None. FINDINGS: Femoral heads are normally located. Hip joint spaces are relatively preserved and appear symmetric. Right inguinal surgical clips. Pelvis intact. Sacral ala appear grossly intact. SI joints within normal limits. Grossly intact proximal left femur. Proximal right femur appears intact. Aortoiliac calcified atherosclerosis noted. Right femoral artery calcified atherosclerosis. IMPRESSION: 1. No acute osseous abnormality identified about the right hip or femur. 2. Calcified aortic atherosclerosis. Electronically Signed   By: Genevie Ann M.D.   On: 09/02/2015 15:12     Microbiology: No results found for this or any previous visit (from the past 240 hour(s)).   Labs: Basic Metabolic Panel:  Recent Labs Lab 09/24/15 1350 09/25/15 0817 09/27/15 0551  NA 139 140 137  K 4.2 4.2 4.5  CL 101 99* 100*  CO2 '25 31 28  '$ GLUCOSE 216* 174* 162*  BUN 20 20 21*  CREATININE 0.90 0.95 0.74  CALCIUM 9.1 9.0 9.1   Liver Function Tests: No results for input(s): AST, ALT, ALKPHOS, BILITOT, PROT, ALBUMIN in the last 168 hours. No results for input(s): LIPASE, AMYLASE in the last 168 hours. No results for input(s): AMMONIA in the last 168 hours.  CBC:  Recent Labs Lab 09/24/15 1350 09/25/15 0817 09/27/15 0551  WBC 9.9 9.3 7.0  HGB 15.5 15.2 14.7  HCT 48.4 47.7 46.4  MCV 97.8 98.8 98.1  PLT 184 163 166   Cardiac Enzymes:  Recent Labs Lab 09/24/15 1811 09/25/15 0111 09/25/15 0817 09/26/15 0846  TROPONINI <0.03 <0.03 <0.03 <0.03   BNP: BNP (last 3 results)  Recent Labs  09/24/15 1350  BNP 285.0*    ProBNP (last 3 results) No results for input(s): PROBNP in the last 8760 hours.  CBG:  Recent Labs Lab 09/26/15 0751 09/26/15 1200 09/26/15 1630 09/26/15 2216 09/27/15 0740  GLUCAP 143* 138* 123* 119* 159*       Signed:  Kelvin Cellar MD.  [facility] 09/27/2015, 12:22 PM

## 2015-09-27 NOTE — Progress Notes (Signed)
Pt reports less dizziness and we has been able to ambulate c one person assist and RW tonight

## 2015-09-27 NOTE — Care Management Note (Signed)
Case Management Note  Patient Details  Name: [name] MRN: [id number] Date of Birth: January 07, 1946  Subjective/Objective:  Near syncope                  Action/Plan: Discharge Planning: AVS reviewed:  NCM spoke to pt and offered choice/HH list provided. Pt requested in [facility] for [place]. Contacted [facility] liaison, [place] with new referral. Pt states his wife will bring his portable oxygen tank. Pt states he can afford his medications. Has RW at home.   Melody Comas MD   Expected Discharge Date:  09/27/2015              Expected Discharge Plan:  [place]  In-House Referral:  NA  Discharge planning Services  CM Consult  Post Acute Care Choice:  Home [facility] home 02-has travel tank.) Choice offered to:  Patient  DME Arranged:  N/A DME Agency:  NA  HH Arranged:  RN, PT [place] Agency:  [facility]  Status of Service:  Completed, signed off  Medicare Important Message Given:    Date Medicare IM Given:    Medicare IM give by:    Date Additional Medicare IM Given:    Additional Medicare Important Message give by:     If discussed at [place] of Stay Meetings, dates discussed:    Additional Comments:  Erenest Rasher, RN 09/27/2015, 1:18 PM

## 2015-09-27 NOTE — Progress Notes (Signed)
[facility] PROGRESS NOTE  KOURTLAND COOPMAN NIO:[id number] DOB: 1945-09-29 DOA: 09/24/2015 PCP: [name], NP  Assessment/Plan: 1. Orthostatic hypotension. -Mr Vandermeulen presented with complaints of near syncope. On initial evaluation in the emergency room he was given IV Lasix as ED provider felt he could be volume overloaded. -He reported worsening dizziness from following day as he had a 20 mmHg drop in his systolic blood pressure from laying to standing. -I suspect that orthostasis is the cause of his near syncope -He was given a 500 mL bolus of normal saline on 09/25/2015 -Repeat orthostatics performed on 09/26/2015 continuing to show a > than 20 mmHg drop in his systolic blood pressure from laying to standing (130 to 105), that is associate with dizziness/lightheadedness. -Provided a 250 mL bolus of NS on 09/26/2015 -Still reporting dizziness, plan to repeat orthostatics today  2.  Chronic systolic congestive heart failure. -Last transthoracic echocardiogram performed on 09/09/2013 that showed EF of 30-35%. -He is orthostatic and continues to complain of dizziness with standing. -Discontinued oral Lasix and provided a 500 mL bolus of normal saline on 09/25/2015. -Repeat orthostatics on 09/26/2015 showing > 20 mmHg drop in his systolic blood pressures laying to standing L is associate with dizziness/lightheadedness. On this date I gave him an additional 250 mL bolus of normal saline. -Will need to monitor volume status closely given his reduced ejection fraction  3.  History of permanent atrial fibrillation -Has a CHADSVasc score of 6. -Remains rate controlled  -Previously on anticoagulation which was discontinued due to intracerebral bleed. -Continue Coreg 3.125 mg as by mouth twice a day  4.  Coronary artery disease. -EKG did not reveal acute ischemic changes. -Opponens cycled and remained negative 3 sets. -He presently denies chest pain or shortness of breath. I suspect  dizziness is related to orthostatic hypotension  5.  Type 2 diabetes mellitus -Sugars controlled, continue metformin 1000 mg by mouth twice a day  6.  History of hypertension. -Systolic blood pressures coming down to 89 with standing. -He complains of worsening dizziness  -Discontinued ARB therapy  Code Status: Full code Family Communication:  Disposition Plan: Remains dizzy repeating orthostatics today   HPI/Subjective: Mr. Leisinger is a pleasant 70 year old gentleman with multiple comorbidities including chronic systolic congestive heart failure having an ejection fraction of 30-35% presented to the emergency room with complaints of near syncope. On presentation he was given 60 mg of IV Lasix 1 in the emergency department. He was placed in overnight observation with continuous cardiac monitoring. Troponins were cycled and remained negative 3 sets. He was found to be orthostatic. By the following day reported increasing dizziness with standing feeling like he was going to "pass out". Repeat orthostatics the following day revealed 20 mmHg drop in systolic blood pressure going from laying to standing. His Lasix was held as he was given a 500 mL bolus of normal saline.  Objective: Filed Vitals:   09/26/15 2217 09/27/15 0528  BP: 114/70 125/70  Pulse: 70 79  Temp: 98 F (36.7 C) 97.8 F (36.6 C)  Resp: 18 18    Intake/Output Summary (Last 24 hours) at 09/27/15 0639 Last data filed at 09/27/15 0615  Gross per 24 hour  Intake   1440 ml  Output   1950 ml  Net   -510 ml   Filed Weights   09/24/15 1312 09/24/15 1724 09/27/15 0528  Weight: 95.709 kg (211 lb) 93.6 kg (206 lb 5.6 oz) 93.6 kg (206 lb 5.6 oz)    Exam:  General:  He is sitting at bedside chair, appears comfortable however reports dizziness/lightheadedness upon standing.  Cardiovascular: Irregular rate and rhythm normal S1-S2  Respiratory: Normal respiratory effort  Abdomen: Soft nontender  nondistended  Musculoskeletal: No edema  Data Reviewed: Basic Metabolic Panel:  Recent Labs Lab 09/24/15 1350 09/25/15 0817  NA 139 140  K 4.2 4.2  CL 101 99*  CO2 25 31  GLUCOSE 216* 174*  BUN 20 20  CREATININE 0.90 0.95  CALCIUM 9.1 9.0   Liver Function Tests: No results for input(s): AST, ALT, ALKPHOS, BILITOT, PROT, ALBUMIN in the last 168 hours. No results for input(s): LIPASE, AMYLASE in the last 168 hours. No results for input(s): AMMONIA in the last 168 hours. CBC:  Recent Labs Lab 09/24/15 1350 09/25/15 0817 09/27/15 0551  WBC 9.9 9.3 7.0  HGB 15.5 15.2 14.7  HCT 48.4 47.7 46.4  MCV 97.8 98.8 98.1  PLT 184 163 166   Cardiac Enzymes:  Recent Labs Lab 09/24/15 1811 09/25/15 0111 09/25/15 0817 09/26/15 0846  TROPONINI <0.03 <0.03 <0.03 <0.03   BNP (last 3 results)  Recent Labs  09/24/15 1350  BNP 285.0*    ProBNP (last 3 results) No results for input(s): PROBNP in the last 8760 hours.  CBG:  Recent Labs Lab 09/25/15 2128 09/26/15 0751 09/26/15 1200 09/26/15 1630 09/26/15 2216  GLUCAP 157* 143* 138* 123* 119*    No results found for this or any previous visit (from the past 240 hour(s)).   Studies: Dg Chest 2 View  09/25/2015  CLINICAL DATA:  70 year old male with history of pneumonia and shortness of breath. EXAM: CHEST  2 VIEW COMPARISON:  Chest x-ray 09/24/2015. FINDINGS: Elevation of left hemidiaphragm is unchanged. Diffuse peribronchial cuffing and interstitial prominence, similar to the prior examination. The ill-defined opacities in the lung bases bilaterally favored to predominantly reflect subsegmental atelectasis, although areas of airspace consolidation are not excluded. Pulmonary vascular engorgement. Mild cardiomegaly. Upper mediastinal contours are within normal limits allowing for patient positioning. Right-sided biventricular pacemaker/AICD with lead tips projecting over the expected location of the right ventricular apex  and overlying the lateral wall the left ventricle via the coronary sinus and coronary veins. Marker in place suggestive of prior mitral valve replacement. IMPRESSION: 1. Findings suggest mild congestive heart failure, as above. 2. Additional bibasilar opacities are predominantly favored to reflect subsegmental atelectasis, although underlying airspace consolidation from infection or aspiration in the lung bases is not entirely excluded. 3. Elevation of left hemidiaphragm. Electronically Signed   By: Vinnie Langton M.D.   On: 09/25/2015 09:18    Scheduled Meds: . atorvastatin  10 mg Oral Daily  . carvedilol  3.125 mg Oral BID WC  . enoxaparin (LOVENOX) injection  40 mg Subcutaneous Q24H  . insulin aspart  0-15 Units Subcutaneous TID WC  . insulin aspart  0-5 Units Subcutaneous QHS  . metFORMIN  1,000 mg Oral BID WC  . mometasone-formoterol  2 puff Inhalation BID  . sodium chloride flush  3 mL Intravenous Q12H  . sodium chloride flush  3 mL Intravenous Q12H   Continuous Infusions:   Principal Problem:   Near syncope Active Problems:   Orthostatic hypotension   COPD GOLD II   Hypertension   ICD (implantable cardioverter-defibrillator), biventricular, in situ   Pulmonary hypertension (HCC)   Status post minimally invasive mitral valve replacement with bioprosthetic valve   Chronic respiratory failure assoc with chf/ PAH   Chronic combined systolic and diastolic CHF (congestive heart failure) ([place])  Syncope    Time spent: 15 min    Kelvin Cellar  [facility] Pager [phone]. If 7PM-7AM, please contact night-coverage at [email or web address], password [facility] 09/27/2015, 6:39 AM

## 2015-09-27 NOTE — Care Management Important Message (Deleted)
Important Message  Patient Details  Name: [name] MRN: [id number] Date of Birth: 06-Jul-1946   Medicare Important Message Given:  Yes    Erenest Rasher, RN 09/27/2015, 1:21 PM

## 2015-09-28 ENCOUNTER — Telehealth: Payer: Self-pay | Admitting: Adult Health

## 2015-09-28 NOTE — Telephone Encounter (Signed)
Will route to PCP as FYI.

## 2015-09-28 NOTE — Telephone Encounter (Signed)
[name] with [facility] called to advise pt was dc'd from hospital yesterday. Pt had referral to home health. But when they got there, pt  has refused their services.

## 2015-09-29 ENCOUNTER — Telehealth: Payer: Self-pay | Admitting: General Practice

## 2015-09-29 NOTE — Telephone Encounter (Signed)
Transition Care Management Follow-up Telephone Call   Date discharged?  09/27/15  How have you been since you were released from the hospital? Weak but OK.  Do you understand why you were in the hospital? yes   Do you understand the discharge instructions? yes   Where were you discharged to? Home   Items Reviewed:  Medications reviewed: yes  Allergies reviewed: yes  Dietary changes reviewed: yes  Referrals reviewed: yes   Functional Questionnaire:   Activities of Daily Living (ADLs):   He states they are independent in the following: Ambulation, feeding, toileting, dressing, etc. States they require assistance with the following:    Any transportation issues/concerns?: no   Any patient concerns? no   Confirmed importance and date/time of follow-up visits scheduled yes  Provider Appointment booked with:  [name] on 3/24 @ 1:30.  Confirmed with patient if condition begins to worsen call PCP or go to the ER.  Patient was given the office number and encouraged to call back with question or concerns.  : yes

## 2015-10-02 ENCOUNTER — Ambulatory Visit (INDEPENDENT_AMBULATORY_CARE_PROVIDER_SITE_OTHER): Payer: Medicare Other | Admitting: Gastroenterology

## 2015-10-02 ENCOUNTER — Encounter: Payer: Self-pay | Admitting: Adult Health

## 2015-10-02 ENCOUNTER — Ambulatory Visit (INDEPENDENT_AMBULATORY_CARE_PROVIDER_SITE_OTHER): Payer: Medicare Other | Admitting: Adult Health

## 2015-10-02 VITALS — BP 104/60 | Temp 97.5°F | Ht 74.0 in | Wt 204.8 lb

## 2015-10-02 DIAGNOSIS — K7460 Unspecified cirrhosis of liver: Secondary | ICD-10-CM

## 2015-10-02 DIAGNOSIS — M545 Low back pain, unspecified: Secondary | ICD-10-CM

## 2015-10-02 DIAGNOSIS — Z23 Encounter for immunization: Secondary | ICD-10-CM

## 2015-10-02 DIAGNOSIS — R17 Unspecified jaundice: Secondary | ICD-10-CM

## 2015-10-02 DIAGNOSIS — I951 Orthostatic hypotension: Secondary | ICD-10-CM

## 2015-10-02 DIAGNOSIS — Z5189 Encounter for other specified aftercare: Secondary | ICD-10-CM

## 2015-10-02 DIAGNOSIS — Z09 Encounter for follow-up examination after completed treatment for conditions other than malignant neoplasm: Secondary | ICD-10-CM

## 2015-10-02 MED ORDER — METHYLPREDNISOLONE 4 MG PO TBPK: ORAL_TABLET | ORAL | Status: DC

## 2015-10-02 NOTE — Patient Instructions (Addendum)
It was great seeing you today. I am glad you are feeling better.   I have sent in a prescription for Prednisone, take as directed - this is for the sciatica pain.   Stay hydrated. It is believed that you were dehydrated and that is why you were getting dizzy.   Go to the ER with any continued dizziness.   Follow up with Cardiology

## 2015-10-02 NOTE — Progress Notes (Signed)
Subjective:    Patient ID: [name], male    DOB: [birth year], 70 y.o.   MRN: [id number]  HPI 70 year old male  has a past medical history of Hypertension; Endocarditis; Colon polyps; Dyslipidemia; Atrial fibrillation ([place]); Cardiomyopathy; Ejection fraction < 50%; Atrial septal defect; Mitral regurgitation; Intracranial hemorrhage ([place]); Renal artery stenosis ([place]); Pulmonary hypertension ([place]); HLD (hyperlipidemia); Automatic implantable cardioverter-defibrillator in situ; CVA (cerebral vascular accident) ([place]) (2009); CHF (congestive heart failure) ([place]); Myocardial infarction ([place]) (2010); Dysrhythmia; Pneumonia; Spontaneous pneumothorax; Permanent atrial fibrillation; Status post minimally invasive mitral valve replacement with bioprosthetic valve; Prosthetic valve dysfunction; Chronic combined systolic and diastolic CHF (congestive heart failure) ([place]); Pacemaker; Diabetes mellitus without complication ([place]); Headache(784.0); COPD (chronic obstructive pulmonary disease) ([place]); COPD GOLD II (01/11/2007); and Lung cancer ([place]) (11/29/2013).   Presents to the office today s/p hospital admission. He was admitted on 09/24/2015 and discharged on 09/27/2015.  He originally presented to the emergency room after having a near syncopal event earlier at a restaurant. [name] reports that prior to the incident he was in his usual state of health, when he went to sit down in the booth he had a sudden onset of dizziness, lightheadedness feeling as though he is about the pass out. His wife noted that he became pale and diaphoretic.He denied chest pain, shortness of breath, fevers, chills, cough, sputum production, abdominal pain, bloody stools, focal neurological deficits.  In the hospital he was orthostatic on multiple occasions and continued to complain of dizziness with standing. His oral Lasix was discontinued and he was given boluses of normal saline. Once adequately hydrated orthostatic hypotension had  resolved.  EKG was unremarkable  CT of head performed in the emergency room showed no evidence of acute intracranial abnormality  Labs were unremarkable except for an elevated blood sugar. He had troponins cycled 3 all were negative  In the office today he reports that he has not experienced any dizziness, lightheadedness, abnormal shortness of breath, or near syncopal episodes since being discharged from the hospital.  His only complaint has nothing to do with his most recent hospital admission. He reports that he was seen in the emergency room in February for right-sided low back pain with radiation to the right groin. This happened when he is sitting on the commode and bent over he felt a "pop". He has Been taking over-the-counter pain medications with minimal to moderate relief    Review of Systems  Constitutional: Negative.   Respiratory: Negative.   Cardiovascular: Negative.   Musculoskeletal: Positive for back pain.  Neurological: Negative.   All other systems reviewed and are negative.  Past Medical History  Diagnosis Date  . Hypertension   . Endocarditis     Bacterial, 2009  . Colon polyps   . Dyslipidemia   . Atrial fibrillation ([place])     AV Node ablation January, 2010, for rapid atrial fib  . Cardiomyopathy     non-ischemic  . Ejection fraction < 50%   . Atrial septal defect     Closed with surgery January, 2010  . Mitral regurgitation     Severe symptomatic primary MR due to bacterial endocarditis, treated w/ MVR  . Intracranial hemorrhage (H[facility])     Coumadin cannot be used because of the history of his bleed  . Renal artery stenosis (H[facility])     Mild by history  . Pulmonary hypertension ([facility])     Moderate  . HLD (hyperlipidemia)   . Automatic implantable cardioverter-defibrillator in situ  LV dysfunction and pacer needed for AV node lesion  . CVA (cerebral vascular accident) ([place]) 2009    denies residual on 08/14/2013  . CHF (congestive heart failure) ([place])    . Myocardial infarction ([place]) 2010  . Dysrhythmia   . Pneumonia     "this is my first case" (08/14/2013)  . Spontaneous pneumothorax     right thoracotomy - distant past  . Permanent atrial fibrillation     Originally Coumadin use for atrial fibrillation  //   he had intracerebral hemorrhage with an INR of 2.3 June, 2009. Anticoagulation could no longer be used.  //  Rapid atrial fibrillation after inferior MI October, 2010..........Marland Kitchen AV node ablation done at that time with ICD pacemaker placed (EF 35%).   //   Left atrial appendage tied off at the time of mitral valve surgery January, 2010 (maze pro  . Status post minimally invasive mitral valve replacement with bioprosthetic valve     33 mm Medtronic Mosaic porcine bioprosthesis placed via right mini thoracotomy for bacterial endocarditis complicated by severe MR and CHF   . Prosthetic valve dysfunction     Mild mitral stenosis  . Chronic combined systolic and diastolic CHF (congestive heart failure) ([place])   . Pacemaker   . Diabetes mellitus without complication ([place])   . Headache(784.0)     related to stroke only  . COPD (chronic obstructive pulmonary disease) (H[facility])     O2- 2 liters, nasal cannula, q night   . COPD GOLD II 01/11/2007    PFT's 09/25/13  FEV1  2.18 (61%) ratio 64 no change p B2 and DLC0  32% corrects to 68% - trial off advair and acei rec starting  08/23/2013     . Lung cancer ([place]) 11/29/2013    T1N0 Stage Ia non-small cell carcinoma left lung treated with wedge resection    Social History   Social History  . Marital Status: Married    Spouse Name: [name]  . Number of Children: 0  . Years of Education: College   Occupational History  . Retired     Tour manager   Social History Main Topics  . Smoking status: Former Smoker -- 1.00 packs/day for 45 years    Types: Cigarettes    Quit date: 08/11/2013  . Smokeless tobacco: Never Used     Comment: 08/14/2013 "quit smoking in 2009"  . Alcohol Use: No     Comment:  08/14/2013 "used to drink beer; quit:in 1982"  . Drug Use: No  . Sexual Activity: Yes   Other Topics Concern  . Not on file   Social History Narrative   Patient lives at home with his spouse.   Caffeine Use: none   Worked for the [employer]   Has two boys and a girl. All live local.     Past Surgical History  Procedure Laterality Date  . Mitral valve replacement Right 07/17/2008    12m Medtronic Mosaic porcine bioprosthesis  . Cardiac defibrillator placement  ~ 2010    St Jude  . Penile prosthesis implant    . Appendectomy    . Mass biopsy Left     neck mass  . Leep    . Cataract extraction w/ intraocular lens  implant, bilateral Bilateral   . Asd repair, secundum  07/17/2008    pericardial patch closure of ASD  . Av node ablation  07/2008    for rapid atrial fib  . Thoracotomy Right 1970's    spontaneous  pneumothorax - while in the [employer]  . Tonsillectomy    . Insert / replace / remove pacemaker    . Cardiac valve replacement    . Cardiac catheterization    . Video assisted thoracoscopy (vats)/wedge resection Left 11/29/2013    Procedure: Video assisted thoracoscopy for wedge resection; mini thoracotomy;  Surgeon: [name], MD;  Location: [facility];  Service: Thoracic;  Laterality: Left;  . Implantable cardioverter defibrillator (icd) generator change N/A 02/06/2012    Procedure: ICD GENERATOR CHANGE;  Surgeon: [name], MD;  Location[facility] CATH LAB;  Service: Cardiovascular;  Laterality: N/A;  . Right heart catheterization N/A 08/16/2013    Procedure: RIGHT HEART CATH;  Surgeon: Josue Hector, MD;  Location[facility] CATH LAB;  Service: Cardiovascular;  Laterality: N/A;    Family History  Problem Relation Age of Onset  . Stomach cancer Father   . Stroke Mother     Allergies  Allergen Reactions  . Anticoagulant Compound Other (See Comments)    Pt had intracranial bleed, therefore all anticoagulation is contra-indicated per Dr. Ron Parker.  . Warfarin Sodium Other (See  Comments)    Pt had intracranial bleed, therefore all anticoagulation is contra-indicated per Dr. Ron Parker.     Current Outpatient Prescriptions on File Prior to Visit  Medication Sig Dispense Refill  . aspirin EC 81 MG tablet Take 1 tablet (81 mg total) by mouth daily. 90 tablet 3  . atorvastatin (LIPITOR) 10 MG tablet TAKE 1 TABLET BY MOUTH EVERY DAY 90 tablet 3  . budesonide-formoterol (SYMBICORT) 160-4.5 MCG/ACT inhaler Inhale 2 puffs into the lungs 2 (two) times daily.    . carvedilol (COREG) 3.125 MG tablet TAKE 1 TABLET BY MOUTH TWICE A DAY WITH MEALS 180 tablet 1  . furosemide (LASIX) 40 MG tablet Take 40 mg by mouth daily. May take 1 extra daily as needed for edema    . HYDROcodone-acetaminophen (NORCO/VICODIN) 5-325 MG tablet Take 2 tablets by mouth every 4 (four) hours as needed. 10 tablet 0  . metFORMIN (GLUCOPHAGE) 1000 MG tablet Take 1 tablet (1,000 mg total) by mouth 2 (two) times daily with a meal. 60 tablet 3  . nitroGLYCERIN (NITROSTAT) 0.4 MG SL tablet Place 1 tablet (0.4 mg total) under the tongue every 5 (five) minutes as needed. For chest pain 25 tablet 1   No current facility-administered medications on file prior to visit.    BP 104/60 mmHg  Temp(Src) 97.5 F (36.4 C) (Oral)  Ht '6\' 2"'$  (1.88 m)  Wt 204 lb 12.8 oz (92.897 kg)  BMI 26.28 kg/m2       Objective:   Physical Exam  Constitutional: He is oriented to person, place, and time. He appears well-developed and well-nourished. No distress.  Cardiovascular: Normal rate, regular rhythm, normal heart sounds and intact distal pulses.  Exam reveals no gallop and no friction rub.   No murmur heard. Pulmonary/Chest: Effort normal and breath sounds normal. No respiratory distress. He has no wheezes. He has no rales. He exhibits no tenderness.  2 L via nasal cannula  Abdominal: Soft. Bowel sounds are normal. He exhibits no distension and no mass. There is no tenderness. There is no rebound and no guarding.    Musculoskeletal: Normal range of motion. He exhibits tenderness (Right lower back). He exhibits no edema.  Neurological: He is alert and oriented to person, place, and time.  Skin: Skin is warm and dry. No rash noted. He is not diaphoretic. No erythema. No pallor.  Psychiatric: He has a normal mood and affect. His behavior is normal. Judgment and thought content normal.  Nursing note and vitals reviewed.     Assessment & Plan:  1. Hospital discharge follow-up - Patient was not orthostatic in the office today. Supine blood pressure was 120/80, when sitting up his blood pressure was 118/82. He denied any dizziness or lightheadedness.  - Advised to stay hydrated and eat healthy balanced meals - he has a cardiology appointment in 3 days. - Reviewed hospital labs, EKG, and imaging studies  2. Right-sided low back pain without sciatica - Work on stretching exercises - methylPREDNISolone (MEDROL DOSEPAK) 4 MG TBPK tablet; Take as directed  Dispense: 21 tablet; Refill: 0 - He is heating pad on lower back

## 2015-10-04 NOTE — Progress Notes (Signed)
Cardiology Office Note   Date:  10/05/2015   ID:  [name], DOB [birth year], MRN [id number]  PCP:  [name], NP  Cardiologist:  Dr. Marlou Porch (formerly Dr. Ron Parker)  Chief Complaint  Patient presents with  . Follow-up    seen for Dr. Marlou Porch      History of Present Illness: [name] is a 70 y.o. male who presents for post hospital follow-up. He has a past medical history of hypertension, atrial septal defect s/p surgical correction 07/2008, moderate pulmonary hypertension, hyperlipidemia, DM, COPD, mitral valve disease s/p bioprosthetic valve, atrial fibrillation s/p AV nodal ablation in 07/2008 s/p ICD due to inability to control heart rate. He is not on any systemic anticoagulation because of prior CNS bleed. He has had prior left atrial appendage occlusion/tied. He is been followed by Dr. Earlie Server for lung cancer which was initially diagnosed in 2015 as non-small cell carcinoma, he underwent LUL wedge resection and has been on home O2 ever since.  He was recently admitted from 3/16 - 3/19 for near syncope. On initial evaluation in the ED, he was given IV Lasix as it was felt he may be fluid overloaded. He had worsening dizziness on the following day. He had 20 mmHg drop from his systolic blood pressure from lying to standing. It was felt his near syncope episode may be triggered by orthostatic hypotension.  Since discharge, patient has been doing well. He is on chronic oxygen at 2 L/m. He denies any increasing shortness of breath. He denies any recurrent dizziness since he left the hospital. She is low pressure is borderline today, last visit, his blood pressure was 104/60. Today was 94/56. He denies any significant symptom associated with this. The only blood pressure he is taking is 3.125 twice a day of carvedilol. I have instructed him to obtain home blood pressure cuff and  check blood pressure at 2 hour after morning medication and the second time around 6 PM. If his blood  pressure continued to decrease, we may have to discontinue the carvedilol as well. However given the fact that he is on the lowest dose of Carvedilol, taking away this medication may not improve his HR by much.    Past Medical History  Diagnosis Date  . Hypertension   . Endocarditis     Bacterial, 2009  . Colon polyps   . Dyslipidemia   . Atrial fibrillation ([place])     AV Node ablation January, 2010, for rapid atrial fib  . Cardiomyopathy     non-ischemic  . Ejection fraction < 50%   . Atrial septal defect     Closed with surgery January, 2010  . Mitral regurgitation     Severe symptomatic primary MR due to bacterial endocarditis, treated w/ MVR  . Intracranial hemorrhage (HCC)     Coumadin cannot be used because of the history of his bleed  . Renal artery stenosis (HCC)     Mild by history  . Pulmonary hypertension (HCC)     Moderate  . HLD (hyperlipidemia)   . Automatic implantable cardioverter-defibrillator in situ     LV dysfunction and pacer needed for AV node lesion  . CVA (cerebral vascular accident) ([place]) 2009    denies residual on 08/14/2013  . CHF (congestive heart failure) ([place])   . Myocardial infarction ([place]) 2010  . Dysrhythmia   . Pneumonia     "this is my first case" (08/14/2013)  . Spontaneous pneumothorax     right  thoracotomy - distant past  . Permanent atrial fibrillation     Originally Coumadin use for atrial fibrillation  //   he had intracerebral hemorrhage with an INR of 2.3 June, 2009. Anticoagulation could no longer be used.  //  Rapid atrial fibrillation after inferior MI October, 2010..........Marland Kitchen AV node ablation done at that time with ICD pacemaker placed (EF 35%).   //   Left atrial appendage tied off at the time of mitral valve surgery January, 2010 (maze pro  . Status post minimally invasive mitral valve replacement with bioprosthetic valve     33 mm Medtronic Mosaic porcine bioprosthesis placed via right mini thoracotomy for bacterial endocarditis  complicated by severe MR and CHF   . Prosthetic valve dysfunction     Mild mitral stenosis  . Chronic combined systolic and diastolic CHF (congestive heart failure) ([place])   . Pacemaker   . Diabetes mellitus without complication ([place])   . Headache(784.0)     related to stroke only  . COPD (chronic obstructive pulmonary disease) (HCC)     O2- 2 liters, nasal cannula, q night   . COPD GOLD II 01/11/2007    PFT's 09/25/13  FEV1  2.18 (61%) ratio 64 no change p B2 and DLC0  32% corrects to 68% - trial off advair and acei rec starting  08/23/2013     . Lung cancer ([place]) 11/29/2013    T1N0 Stage Ia non-small cell carcinoma left lung treated with wedge resection    Past Surgical History  Procedure Laterality Date  . Mitral valve replacement Right 07/17/2008    42m Medtronic Mosaic porcine bioprosthesis  . Cardiac defibrillator placement  ~ 2010    St Jude  . Penile prosthesis implant    . Appendectomy    . Mass biopsy Left     neck mass  . Leep    . Cataract extraction w/ intraocular lens  implant, bilateral Bilateral   . Asd repair, secundum  07/17/2008    pericardial patch closure of ASD  . Av node ablation  07/2008    for rapid atrial fib  . Thoracotomy Right 1970's    spontaneous pneumothorax - while in the [employer] . Tonsillectomy    . Insert / replace / remove pacemaker    . Cardiac valve replacement    . Cardiac catheterization    . Video assisted thoracoscopy (vats)/wedge resection Left 11/29/2013    Procedure: Video assisted thoracoscopy for wedge resection; mini thoracotomy;  Surgeon: CRexene Alberts MD;  [facility]  Service: Thoracic;  Laterality: Left;  . Implantable cardioverter defibrillator (icd) generator change N/A 02/06/2012    Procedure: ICD GENERATOR CHANGE;  Surgeon: GEvans Lance MD;  [facility];  Service: Cardiovascular;  Laterality: N/A;  . Right heart catheterization N/A 08/16/2013    Procedure: RIGHT HEART CATH;  Surgeon: PJosue Hector MD;   [facility];  Service: Cardiovascular;  Laterality: N/A;     Current Outpatient Prescriptions  Medication Sig Dispense Refill  . aspirin EC 81 MG tablet Take 1 tablet (81 mg total) by mouth daily. 90 tablet 3  . atorvastatin (LIPITOR) 10 MG tablet TAKE 1 TABLET BY MOUTH EVERY DAY 90 tablet 3  . budesonide-formoterol (SYMBICORT) 160-4.5 MCG/ACT inhaler Inhale 2 puffs into the lungs 2 (two) times daily.    . carvedilol (COREG) 3.125 MG tablet TAKE 1 TABLET BY MOUTH TWICE A DAY WITH MEALS 180 tablet 1  . furosemide (LASIX) 40 MG  tablet Take 40 mg by mouth daily. May take 1 extra daily as needed for edema    . HYDROcodone-acetaminophen (NORCO/VICODIN) 5-325 MG tablet Take 2 tablets by mouth every 4 (four) hours as needed. 10 tablet 0  . metFORMIN (GLUCOPHAGE) 1000 MG tablet Take 1 tablet (1,000 mg total) by mouth 2 (two) times daily with a meal. 60 tablet 3  . nitroGLYCERIN (NITROSTAT) 0.4 MG SL tablet Place 0.4 mg under the tongue every 5 (five) minutes as needed for chest pain (x 3 doses).     No current facility-administered medications for this visit.    Allergies:   Anticoagulant compound and Warfarin sodium    Social History:  The patient  reports that he quit smoking about 2 years ago. His smoking use included Cigarettes. He has a 45 pack-year smoking history. He has never used smokeless tobacco. He reports that he does not drink alcohol or use illicit drugs.   Family History:  The patient's family history includes Stomach cancer in his father; Stroke in his mother.    ROS:  Please see the history of present illness.   Otherwise, review of systems are positive for none.   All other systems are reviewed and negative.    PHYSICAL EXAM: VS:  BP 94/56 mmHg  Pulse 70  Ht '6\' 2"'$  (1.88 m)  Wt 200 lb 12.8 oz (91.082 kg)  BMI 25.77 kg/m2 , BMI Body mass index is 25.77 kg/(m^2). GEN: Well nourished, well developed, in no acute distress HEENT: normal Neck: no JVD, carotid  bruits, or masses Cardiac: RRR; no murmurs, rubs, or gallops,no edema  Respiratory:  clear to auscultation bilaterally, normal work of breathing GI: soft, nontender, nondistended, + BS MS: no deformity or atrophy Skin: warm and dry, no rash Neuro:  Strength and sensation are intact Psych: euthymic mood, full affect   EKG:  EKG is ordered today. The ekg ordered today demonstrates paced rhythm with underlying aflutter   Recent Labs: 06/22/2015: ALT 13 09/24/2015: B Natriuretic Peptide 285.0*; TSH 0.592 09/27/2015: BUN 21*; Creatinine, Ser 0.74; Hemoglobin 14.7; Platelets 166; Potassium 4.5; Sodium 137    Lipid Panel    Component Value Date/Time   CHOL 105 07/01/2013 0957   TRIG 125.0 07/01/2013 0957   HDL 40.80 07/01/2013 0957   CHOLHDL 3 07/01/2013 0957   VLDL 25.0 07/01/2013 0957   LDLCALC 39 07/01/2013 0957   LDLDIRECT 80.2 03/31/2011 1411      Wt Readings from Last 3 Encounters:  10/05/15 200 lb 12.8 oz (91.082 kg)  10/02/15 204 lb 12.8 oz (92.897 kg)  09/27/15 206 lb 5.6 oz (93.6 kg)      Other studies Reviewed: Additional studies/ records that were reviewed today include:   Echo 08/13/2013 LV EF: 30% -  35%  ------------------------------------------------------------ Indications:   Shortness of breath 786.05.  ------------------------------------------------------------ History:  PMH: Atrial Septal Defect. Hypoxia. Pulmonary Hypertension. AV Nodal Ablasion. Endocarditis.Ischemic Cardiomyopathy Atrial fibrillation. Coronary artery disease. Congestive heart failure. Chronic obstructive pulmonary disease.  ------------------------------------------------------------ Study Conclusions  - Left ventricle: There is akinesis of the apex, apical anterior, septal, inferior walls and hypokinesis of the mid and basal inferior and inferolateral walls. The cavity size was mildly dilated. There was mild concentric hypertrophy. Systolic function was  moderately to severely reduced. The estimated ejection fraction was in the range of 30% to 35%. - Aortic root: Mildly dilated aortic root measuring 44 mm. - Ascending aorta: The ascending aorta was normal in size. - Mitral valve: There is a  bioprosthetic mitral valve, that is well seated. There is no mitral regurgitation and no paravalvular leak. Transmitral gradients areelevated when compared to the prior study fromDember 17,2014(average mean gradient 49mHg, previosuly6 mmHg). Mobility cannot be assessed on current study.A TEE or fluoroscopy should be considered to rule out leaflet thrombosis. Valve area by continuity equation (using LVOT flow): 0.87cm^2. - Left atrium: The atrium was severely dilated. - Right ventricle: The cavity size was mildly dilated. Systolic function was moderately reduced. - Right atrium: The atrium was moderately dilated. - Atrial septum: No defect or patent foramen ovale was identified. - Impressions: When compared to the prior study from 06/26/2013 the LV cavity is now mildly dilated. Transmitral gradient across bioprosthetic valve are elevated when compared to the prior study. Leaflet motion is not well visualized. If clinically indicated afluoroscopy or TEE is recommended. Impressions:  - When compared to the prior study from 06/26/2013 the LV cavity is now mildly dilated. Transmitral gradient across bioprosthetic valve are elevated when compared to the prior study. Leaflet motion is not well visualized. If clinically indicated afluoroscopy or TEE is recommended.      Review of the above records demonstrates:   Patient with history of atrial fibrillation status post AV nodal ablation currently been placed on ICD recently admitted with presyncopal episode which was felt to be related to orthostatic hypotension. He presents today for post hospital follow-up.    ASSESSMENT AND PLAN:  1. Borderline  hypotension  - It is possible that his recent presyncope episode is related to sudden drop of blood pressure. He was tested positive for orthostatic hypotension in the hospital. I have instructed him to continue fall prevention and monitor for any symptoms with changing body position. His blood pressure is borderline low today, it has always been borderline before. I asked him to get a blood pressure cuff and measure his blood pressure twice a day. If he is blood pressure continued to be low, we may have to discontinue his carvedilol.  - previous echo reviewed low EF with wall motion abnormality in 2014 and 2015, no recent echo, however given lack of angina, i am not inclined to obtain ischemic workup at this time. Note cath 06/17/2008, no significant CAD  2. Permanent atrial fibrillation s/p AV nodal ablation in 07/2008 s/p ICD due to inability to control heart rate  - CHA2DS2-Vasc score 6 (HTN, DM, CVA, HF, age)  - He is not on any systemic anticoagulation because of prior CNS bleed. He has had prior left atrial appendage occlusion/tied.   3. atrial septal defect s/p surgical correction 07/2008   4. mitral valve disease s/p bioprosthetic valve: no obvious murmur on physical exam    Current medicines are reviewed at length with the patient today.  The patient does not have concerns regarding medicines.  The following changes have been made:  no change  Labs/ tests ordered today include:   Orders Placed This Encounter  Procedures  . EKG 12-Lead     Disposition:   FU with Dr. SMarlou Porchin 3 months  Signed, MAlmyra Deforest PUtah 10/05/2015 5:10 PM    [id number] [place]  [id number]: [phone] Fax: [phone]

## 2015-10-05 ENCOUNTER — Ambulatory Visit (INDEPENDENT_AMBULATORY_CARE_PROVIDER_SITE_OTHER): Payer: Medicare Other | Admitting: Physician Assistant

## 2015-10-05 ENCOUNTER — Encounter: Payer: Self-pay | Admitting: Physician Assistant

## 2015-10-05 VITALS — BP 94/56 | HR 70 | Ht 74.0 in | Wt 200.8 lb

## 2015-10-05 DIAGNOSIS — Z95810 Presence of automatic (implantable) cardiac defibrillator: Secondary | ICD-10-CM

## 2015-10-05 DIAGNOSIS — I428 Other cardiomyopathies: Secondary | ICD-10-CM

## 2015-10-05 DIAGNOSIS — I429 Cardiomyopathy, unspecified: Secondary | ICD-10-CM

## 2015-10-05 DIAGNOSIS — I482 Chronic atrial fibrillation, unspecified: Secondary | ICD-10-CM

## 2015-10-05 NOTE — Patient Instructions (Addendum)
Medication Instructions:  Your physician recommends that you continue on your current medications as directed. Please refer to the Current Medication list given to you today.   Labwork: NONE ORDERED  Testing/Procedures: NONE ORDERED  Follow-Up: Your physician recommends that you schedule a follow-up appointment in: 3 MONTHS WITH DR. Marlou Porch   Any Other Special Instructions Will Be Listed Below (If Applicable).     If you need a refill on your cardiac medications before your next appointment, please call your pharmacy.  Monitor your blood pressure every morning 2 hours after you take your blood pressure medication and recheck it at 6:00 p.m. Call your Cardiologist if your blood pressure decreases down into the 80's (top number)

## 2015-10-19 ENCOUNTER — Encounter: Payer: Self-pay | Admitting: Adult Health

## 2015-10-19 ENCOUNTER — Ambulatory Visit (INDEPENDENT_AMBULATORY_CARE_PROVIDER_SITE_OTHER): Payer: Medicare Other | Admitting: Adult Health

## 2015-10-19 VITALS — BP 102/70 | Temp 97.8°F | Ht 74.0 in | Wt 201.8 lb

## 2015-10-19 DIAGNOSIS — Z0000 Encounter for general adult medical examination without abnormal findings: Secondary | ICD-10-CM

## 2015-10-19 DIAGNOSIS — R1031 Right lower quadrant pain: Secondary | ICD-10-CM | POA: Diagnosis not present

## 2015-10-19 DIAGNOSIS — I10 Essential (primary) hypertension: Secondary | ICD-10-CM | POA: Diagnosis not present

## 2015-10-19 DIAGNOSIS — E785 Hyperlipidemia, unspecified: Secondary | ICD-10-CM | POA: Diagnosis not present

## 2015-10-19 DIAGNOSIS — E119 Type 2 diabetes mellitus without complications: Secondary | ICD-10-CM

## 2015-10-19 LAB — LIPID PANEL
CHOLESTEROL: 107 mg/dL (ref 0–200)
HDL: 35.4 mg/dL — AB (ref >39.00)
LDL Cholesterol: 39 mg/dL (ref 0–99)
NONHDL: 71.54
Total CHOL/HDL Ratio: 3
Triglycerides: 164 mg/dL — ABNORMAL HIGH (ref 0.0–149.0)
VLDL: 32.8 mg/dL (ref 0.0–40.0)

## 2015-10-19 LAB — BASIC METABOLIC PANEL
BUN: 20 mg/dL (ref 6–23)
CALCIUM: 9.7 mg/dL (ref 8.4–10.5)
CO2: 32 mEq/L (ref 19–32)
CREATININE: 0.95 mg/dL (ref 0.40–1.50)
Chloride: 99 mEq/L (ref 96–112)
GFR: 83.7 mL/min (ref >60.00)
GLUCOSE: 143 mg/dL — AB (ref 70–99)
POTASSIUM: 4.4 meq/L (ref 3.5–5.1)
SODIUM: 139 meq/L (ref 135–145)

## 2015-10-19 LAB — CBC WITH DIFFERENTIAL/PLATELET
BASOS ABS: 0 10*3/uL (ref 0.0–0.1)
Basophils Relative: 0.4 % (ref 0.0–3.0)
EOS ABS: 0.1 10*3/uL (ref 0.0–0.7)
Eosinophils Relative: 1.3 % (ref 0.0–5.0)
HCT: 45 % (ref 39.0–52.0)
Hemoglobin: 15.1 g/dL (ref 13.0–17.0)
LYMPHS ABS: 0.9 10*3/uL (ref 0.7–4.0)
Lymphocytes Relative: 9 % — ABNORMAL LOW (ref 12.0–46.0)
MCHC: 33.6 g/dL (ref 30.0–36.0)
MCV: 91.9 fl (ref 78.0–100.0)
MONO ABS: 0.7 10*3/uL (ref 0.1–1.0)
Monocytes Relative: 7.4 % (ref 3.0–12.0)
NEUTROS ABS: 7.8 10*3/uL — AB (ref 1.4–7.7)
NEUTROS PCT: 81.9 % — AB (ref 43.0–77.0)
PLATELETS: 220 10*3/uL (ref 150.0–400.0)
RBC: 4.9 Mil/uL (ref 4.22–5.81)
RDW: 15.6 % — ABNORMAL HIGH (ref 11.5–15.5)
WBC: 9.5 10*3/uL (ref 4.0–10.5)

## 2015-10-19 LAB — HEPATIC FUNCTION PANEL
ALT: 13 U/L (ref 0–53)
AST: 14 U/L (ref 0–37)
Albumin: 4.3 g/dL (ref 3.5–5.2)
Alkaline Phosphatase: 69 U/L (ref 39–117)
BILIRUBIN DIRECT: 0.4 mg/dL — AB (ref 0.0–0.3)
TOTAL PROTEIN: 6.8 g/dL (ref 6.0–8.3)
Total Bilirubin: 2.2 mg/dL — ABNORMAL HIGH (ref 0.2–1.2)

## 2015-10-19 LAB — HEMOGLOBIN A1C: HEMOGLOBIN A1C: 7.7 % — AB (ref 4.6–6.5)

## 2015-10-19 NOTE — Progress Notes (Signed)
Subjective:  Patient presents today for their annual wellness visit.  He is a pleasant caucasian male who has a past medical history of hypertension, atrial septal defect s/p surgical correction 07/2008, moderate pulmonary hypertension, hyperlipidemia, DM, COPD, mitral valve disease s/p bioprosthetic valve, atrial fibrillation s/p AV nodal ablation in 07/2008 s/p ICD due to inability to control heart rate. He is not on any systemic anticoagulation because of prior CNS bleed. He has had prior left atrial appendage occlusion/tied. He is been followed by Dr. Earlie Server for lung cancer which was initially diagnosed in 2015 as non-small cell carcinoma, he underwent LUL wedge resection and has been on home O2 ever since.  He was last admitted to the hospital on 09/24/2015 for near syncopal episode. He has not had any more dizziness since being released.   He has an advanced directive and living will  Preventive Screening-Counseling & Management  Smoking Status: Former Smoker [employer] Smoking status: No smokers in home  Risk Factors Regular exercise: Goes to the gym 2 times a week Diet: Does not follow a specific diet.  Fall Risk: one   Cardiac risk factors:  advanced age (older than 72 for men, 74 for women) Hyperlipidemia  Diabetes. Family History: Stroke- Mom  Depression Screen None. PHQ2 0   Activities of Daily Living Independent ADLs and IADLs   Hearing Difficulties: Has a hard time understanding soft or whispered voices.   Cognitive Testing No reported trouble.   Normal 3 word recall  List the Names of Other Physician/Practitioners you currently use: 1.[name]   Immunization History  Administered Date(s) Administered  . Hep A / Hep B 09/25/2014, 10/02/2014, 10/24/2014, 10/02/2015  . Influenza Split 03/31/2011, 04/02/2012  . Influenza Whole 07/11/2000, 04/01/2009, 04/12/2010  . Influenza, High Dose Seasonal PF 05/01/2015  . Influenza,inj,Quad PF,36+  Mos 04/04/2013, 04/18/2014  . Pneumococcal Conjugate-13 08/05/2013  . Pneumococcal Polysaccharide-23 07/11/2000, 04/01/2009, 04/04/2013  . Td 07/11/2000  . Tdap 08/03/2011   Required Immunizations needed today: None  Screening tests- up to date Health Maintenance Due  Topic Date Due  . ZOSTAVAX  06/03/2007  . FOOT EXAM  07/31/2015    ROS- Complaining of right abdominal/groin pain for 2 weeks. Had pain after picking up large branches in yard. Denies any trauma to the area.   The following were reviewed and entered/updated in epic: Past Medical History  Diagnosis Date  . Hypertension   . Endocarditis     Bacterial, 2009  . Colon polyps   . Dyslipidemia   . Atrial fibrillation ([place])     AV Node ablation January, 2010, for rapid atrial fib  . Cardiomyopathy     non-ischemic  . Ejection fraction < 50%   . Atrial septal defect     Closed with surgery January, 2010  . Mitral regurgitation     Severe symptomatic primary MR due to bacterial endocarditis, treated w/ MVR  . Intracranial hemorrhage (HCC)     Coumadin cannot be used because of the history of his bleed  . Renal artery stenosis (HCC)     Mild by history  . Pulmonary hypertension (HCC)     Moderate  . HLD (hyperlipidemia)   . Automatic implantable cardioverter-defibrillator in situ     LV dysfunction and pacer needed for AV node lesion  . CVA (cerebral vascular accident) ([place]) 2009    denies residual on 08/14/2013  . CHF (congestive heart failure) ([place])   . Myocardial infarction ([place]) 2010  . Dysrhythmia   .  Pneumonia     "this is my first case" (08/14/2013)  . Spontaneous pneumothorax     right thoracotomy - distant past  . Permanent atrial fibrillation     Originally Coumadin use for atrial fibrillation  //   he had intracerebral hemorrhage with an INR of 2.3 June, 2009. Anticoagulation could no longer be used.  //  Rapid atrial fibrillation after inferior MI October, 2010..........Marland Kitchen AV node ablation done at that  time with ICD pacemaker placed (EF 35%).   //   Left atrial appendage tied off at the time of mitral valve surgery January, 2010 (maze pro  . Status post minimally invasive mitral valve replacement with bioprosthetic valve     33 mm Medtronic Mosaic porcine bioprosthesis placed via right mini thoracotomy for bacterial endocarditis complicated by severe MR and CHF   . Prosthetic valve dysfunction     Mild mitral stenosis  . Chronic combined systolic and diastolic CHF (congestive heart failure) ([place])   . Pacemaker   . Diabetes mellitus without complication ([place])   . Headache(784.0)     related to stroke only  . COPD (chronic obstructive pulmonary disease) (HCC)     O2- 2 liters, nasal cannula, q night   . COPD GOLD II 01/11/2007    PFT's 09/25/13  FEV1  2.18 (61%) ratio 64 no change p B2 and DLC0  32% corrects to 68% - trial off advair and acei rec starting  08/23/2013     . Lung cancer ([place]) 11/29/2013    T1N0 Stage Ia non-small cell carcinoma left lung treated with wedge resection   Patient Active Problem List   Diagnosis Date Noted  . Orthostatic hypotension 09/26/2015  . Near syncope 09/24/2015  . Syncope 09/24/2015  . Hepatic cirrhosis ([place]) 09/25/2014  . History of colonic polyps 09/25/2014  . Serum total bilirubin elevated 06/30/2014  . Follow-up examination, following unspecified surgery 01/27/2014  . Lung cancer ([place]) 11/29/2013  . Prosthetic valve dysfunction   . Chronic combined systolic and diastolic CHF (congestive heart failure) ([place])   . Diabetes ([place]) 11/04/2013  . Myocardial infarction ([place]) 11/01/2013  . Cardiomyopathy, nonischemic ([place]) 11/01/2013  . H/O intracranial hemorrhage 08/13/2013  . H/O endocarditis 08/13/2013  . H/O: CVA (cerebrovascular accident) 08/13/2013  . Chronic respiratory failure assoc with chf/ PAH 08/12/2013  . H/O atrioventricular nodal ablation   . Status post minimally invasive mitral valve replacement with bioprosthetic valve   . Chronic  systolic CHF (congestive heart failure) ([place]) 01/08/2012  . Hypertension   . Dyslipidemia   . Permanent atrial fibrillation   . Ejection fraction < 50%   . ICD (implantable cardioverter-defibrillator), biventricular, in situ   . Renal artery stenosis ([place])   . Pulmonary hypertension ([place])   . Primary cancer of left upper lobe of lung ([place]) 04/20/2009  . COLONIC POLYPS, ADENOMATOUS 03/22/2007  . COPD GOLD II 01/11/2007  . GERD 01/11/2007   Past Surgical History  Procedure Laterality Date  . Mitral valve replacement Right 07/17/2008    93m Medtronic Mosaic porcine bioprosthesis  . Cardiac defibrillator placement  ~ 2010    St Jude  . Penile prosthesis implant    . Appendectomy    . Mass biopsy Left     neck mass  . Leep    . Cataract extraction w/ intraocular lens  implant, bilateral Bilateral   . Asd repair, secundum  07/17/2008    pericardial patch closure of ASD  . Av node ablation  07/2008  for rapid atrial fib  . Thoracotomy Right 1970's    spontaneous pneumothorax - while in the [employer]  . Tonsillectomy    . Insert / replace / remove pacemaker    . Cardiac valve replacement    . Cardiac catheterization    . Video assisted thoracoscopy (vats)/wedge resection Left 11/29/2013    Procedure: Video assisted thoracoscopy for wedge resection; mini thoracotomy;  Surgeon: [name], MD;  Location: [facility];  Service: Thoracic;  Laterality: Left;  . Implantable cardioverter defibrillator (icd) generator change N/A 02/06/2012    Procedure: ICD GENERATOR CHANGE;  Surgeon: [name], MD;  [facility] C[facility];  Service: Cardiovascular;  Laterality: N/A;  . Right heart catheterization N/A 08/16/2013    Procedure: RIGHT HEART CATH;  Surgeon: Josue Hector, MD;  Location[facility] CATH LAB;  Service: Cardiovascular;  Laterality: N/A;    Family History  Problem Relation Age of Onset  . Stomach cancer Father   . Stroke Mother     Medications- reviewed and updated Current Outpatient  Prescriptions  Medication Sig Dispense Refill  . aspirin EC 81 MG tablet Take 1 tablet (81 mg total) by mouth daily. 90 tablet 3  . atorvastatin (LIPITOR) 10 MG tablet TAKE 1 TABLET BY MOUTH EVERY DAY 90 tablet 3  . budesonide-formoterol (SYMBICORT) 160-4.5 MCG/ACT inhaler Inhale 2 puffs into the lungs 2 (two) times daily.    . carvedilol (COREG) 3.125 MG tablet TAKE 1 TABLET BY MOUTH TWICE A DAY WITH MEALS 180 tablet 1  . furosemide (LASIX) 40 MG tablet Take 40 mg by mouth daily. May take 1 extra daily as needed for edema    . HYDROcodone-acetaminophen (NORCO/VICODIN) 5-325 MG tablet Take 2 tablets by mouth every 4 (four) hours as needed. 10 tablet 0  . metFORMIN (GLUCOPHAGE) 1000 MG tablet Take 1 tablet (1,000 mg total) by mouth 2 (two) times daily with a meal. 60 tablet 3  . nitroGLYCERIN (NITROSTAT) 0.4 MG SL tablet Place 0.4 mg under the tongue every 5 (five) minutes as needed for chest pain (x 3 doses).     No current facility-administered medications for this visit.    Allergies-reviewed and updated Allergies  Allergen Reactions  . Anticoagulant Compound Other (See Comments)    Pt had intracranial bleed, therefore all anticoagulation is contra-indicated per Dr. Ron Parker.  . Warfarin Sodium Other (See Comments)    Pt had intracranial bleed, therefore all anticoagulation is contra-indicated per Dr. Ron Parker.     Social History   Social History  . Marital Status: Married    Spouse Name: [name]  . Number of Children: 0  . Years of Education: College   Occupational History  . Retired     Tour manager   Social History Main Topics  . Smoking status: Former Smoker -- 1.00 packs/day for 45 years    Types: Cigarettes    Quit date: 08/11/2013  . Smokeless tobacco: Never Used     Comment: 08/14/2013 "quit smoking in 2009"  . Alcohol Use: No     Comment: 08/14/2013 "used to drink beer; quit:in 1982"  . Drug Use: No  . Sexual Activity: Yes   Other Topics Concern  . None   Social  History Narrative   Patient lives at home with his spouse.   Caffeine Use: none   Worked for the post [employer]   Has two boys and a girl. All live local.     Objective: BP 102/70 mmHg  Temp(Src) 97.8 F (36.6  C) (Oral)  Ht '6\' 2"'$  (1.88 m)  Wt 201 lb 12.8 oz (91.536 kg)  BMI 25.90 kg/m2  Constitutional: He is oriented to person, place, and time. He appears well-developed and well-nourished. No distress.  Cardiovascular: Normal rate, regular rhythm, normal heart sounds and intact distal pulses. Exam reveals no gallop and no friction rub.  No murmur heard. Pulmonary/Chest: Effort normal and breath sounds normal. No respiratory distress. He has no wheezes. He has no rales. He exhibits no tenderness.  2 L via nasal cannula  Abdominal: Soft. Bowel sounds are normal. He exhibits no distension and no mass. He does have tenderness in right groin/right lower abdomen.  There is no rebound and no guarding. He has a surgical scar in about the area of the discomfort. Hard to tell if he has a hernia Musculoskeletal: Normal range of motion. He exhibits  He exhibits no edema.  Neurological: He is alert and oriented to person, place, and time.  Skin: Skin is warm and dry. No rash noted. He is not diaphoretic. No erythema. No pallor.  Psychiatric: He has a normal mood and affect. His behavior is normal. Judgment and thought content normal.  Return precautions advised.   Assessment/Plan:  1. Encounter for Medicare annual wellness exam - Follow up in one year for MWE - Follow up sooner if needed  2. Essential hypertension - Hepatic function panel - Basic metabolic panel - CBC with Differential/Platelet - Lipid panel  3. Dyslipidemia - Hepatic function panel - Hemoglobin W5I - Basic metabolic panel - CBC with Differential/Platelet - Lipid panel  4. Right lower quadrant abdominal pain - Hard to tell if this is a hernia or not due to prior surgical scar. Likely muscle strain. Doubt this is  liver related - US Pelvis Complete; Future  - Basic metabolic panel - CBC with Differential/Platelet  5. Type 2 diabetes mellitus without complication, without long-term current use of insulin (HCC)  - Hemoglobin O2V - Basic metabolic panel    No orders of the defined types were placed in this encounter.    No orders of the defined types were placed in this encounter.

## 2015-10-19 NOTE — Patient Instructions (Addendum)
It was great seeing you again today!  I will follow up with you regarding your lab work.  Someone will call you to schedule a CT of the abdomen.   Follow up with me in one year or sooner if needed  Health Maintenance, Male A healthy lifestyle and preventative care can promote health and wellness.  Maintain regular health, dental, and eye exams.  Eat a healthy diet. Foods like vegetables, fruits, whole grains, low-fat dairy products, and lean protein foods contain the nutrients you need and are low in calories. Decrease your intake of foods high in solid fats, added sugars, and salt. Get information about a proper diet from your health care provider, if necessary.  Regular physical exercise is one of the most important things you can do for your health. Most adults should get at least 150 minutes of moderate-intensity exercise (any activity that increases your heart rate and causes you to sweat) each week. In addition, most adults need muscle-strengthening exercises on 2 or more days a week.   Maintain a healthy weight. The body mass index (BMI) is a screening tool to identify possible weight problems. It provides an estimate of body fat based on height and weight. Your health care provider can find your BMI and can help you achieve or maintain a healthy weight. For males 20 years and older:  A BMI below 18.5 is considered underweight.  A BMI of 18.5 to 24.9 is normal.  A BMI of 25 to 29.9 is considered overweight.  A BMI of 30 and above is considered obese.  Maintain normal blood lipids and cholesterol by exercising and minimizing your intake of saturated fat. Eat a balanced diet with plenty of fruits and vegetables. Blood tests for lipids and cholesterol should begin at age 42 and be repeated every 5 years. If your lipid or cholesterol levels are high, you are over age 30, or you are at high risk for heart disease, you may need your cholesterol levels checked more frequently.Ongoing  high lipid and cholesterol levels should be treated with medicines if diet and exercise are not working.  If you smoke, find out from your health care provider how to quit. If you do not use tobacco, do not start.  Lung cancer screening is recommended for adults aged 30-80 years who are at high risk for developing lung cancer because of a history of smoking. A yearly low-dose CT scan of the lungs is recommended for people who have at least a 30-pack-year history of smoking and are current smokers or have quit within the past 15 years. A pack year of smoking is smoking an average of 1 pack of cigarettes a day for 1 year (for example, a 30-pack-year history of smoking could mean smoking 1 pack a day for 30 years or 2 packs a day for 15 years). Yearly screening should continue until the smoker has stopped smoking for at least 15 years. Yearly screening should be stopped for people who develop a health problem that would prevent them from having lung cancer treatment.  If you choose to drink alcohol, do not have more than 2 drinks per day. One drink is considered to be 12 oz (360 mL) of beer, 5 oz (150 mL) of wine, or 1.5 oz (45 mL) of liquor.  Avoid the use of street drugs. Do not share needles with anyone. Ask for help if you need support or instructions about stopping the use of drugs.  High blood pressure causes heart disease and  increases the risk of stroke. High blood pressure is more likely to develop in:  People who have blood pressure in the end of the normal range (100-139/85-89 mm Hg).  People who are overweight or obese.  People who are African American.  If you are 60-62 years of age, have your blood pressure checked every 3-5 years. If you are 51 years of age or older, have your blood pressure checked every year. You should have your blood pressure measured twice--once when you are at a hospital or clinic, and once when you are not at a hospital or clinic. Record the average of the two  measurements. To check your blood pressure when you are not at a hospital or clinic, you can use:  An automated blood pressure machine at a pharmacy.  A home blood pressure monitor.  If you are 1-33 years old, ask your health care provider if you should take aspirin to prevent heart disease.  Diabetes screening involves taking a blood sample to check your fasting blood sugar level. This should be done once every 3 years after age 61 if you are at a normal weight and without risk factors for diabetes. Testing should be considered at a younger age or be carried out more frequently if you are overweight and have at least 1 risk factor for diabetes.  Colorectal cancer can be detected and often prevented. Most routine colorectal cancer screening begins at the age of 14 and continues through age 10. However, your health care provider may recommend screening at an earlier age if you have risk factors for colon cancer. On a yearly basis, your health care provider may provide home test kits to check for hidden blood in the stool. A small camera at the end of a tube may be used to directly examine the colon (sigmoidoscopy or colonoscopy) to detect the earliest forms of colorectal cancer. Talk to your health care provider about this at age 87 when routine screening begins. A direct exam of the colon should be repeated every 5-10 years through age 60, unless early forms of precancerous polyps or small growths are found.  People who are at an increased risk for hepatitis B should be screened for this virus. You are considered at high risk for hepatitis B if:  You were born in a country where hepatitis B occurs often. Talk with your health care provider about which countries are considered high risk.  Your parents were born in a high-risk country and you have not received a shot to protect against hepatitis B (hepatitis B vaccine).  You have HIV or AIDS.  You use needles to inject street drugs.  You live  with, or have sex with, someone who has hepatitis B.  You are a man who has sex with other men (MSM).  You get hemodialysis treatment.  You take certain medicines for conditions like cancer, organ transplantation, and autoimmune conditions.  Hepatitis C blood testing is recommended for all people born from [birth year] through 1964 and any individual with known risk factors for hepatitis C.  Healthy men should no longer receive prostate-specific antigen (PSA) blood tests as part of routine cancer screening. Talk to your health care provider about prostate cancer screening.  Testicular cancer screening is not recommended for adolescents or adult males who have no symptoms. Screening includes self-exam, a health care provider exam, and other screening tests. Consult with your health care provider about any symptoms you have or any concerns you have about testicular cancer.  Practice safe sex. Use condoms and avoid high-risk sexual practices to reduce the spread of sexually transmitted infections (STIs).  You should be screened for STIs, including gonorrhea and chlamydia if:  You are sexually active and are younger than 24 years.  You are older than 24 years, and your health care provider tells you that you are at risk for this type of infection.  Your sexual activity has changed since you were last screened, and you are at an increased risk for chlamydia or gonorrhea. Ask your health care provider if you are at risk.  If you are at risk of being infected with HIV, it is recommended that you take a prescription medicine daily to prevent HIV infection. This is called pre-exposure prophylaxis (PrEP). You are considered at risk if:  You are a man who has sex with other men (MSM).  You are a heterosexual man who is sexually active with multiple partners.  You take drugs by injection.  You are sexually active with a partner who has HIV.  Talk with your health care provider about whether you are at  high risk of being infected with HIV. If you choose to begin PrEP, you should first be tested for HIV. You should then be tested every 3 months for as long as you are taking PrEP.  Use sunscreen. Apply sunscreen liberally and repeatedly throughout the day. You should seek shade when your shadow is shorter than you. Protect yourself by wearing long sleeves, pants, a wide-brimmed hat, and sunglasses year round whenever you are outdoors.  Tell your health care provider of new moles or changes in moles, especially if there is a change in shape or color. Also, tell your health care provider if a mole is larger than the size of a pencil eraser.  A one-time screening for abdominal aortic aneurysm (AAA) and surgical repair of large AAAs by ultrasound is recommended for men aged 8-75 years who are current or former smokers.  Stay current with your vaccines (immunizations).   This information is not intended to replace advice given to you by your health care provider. Make sure you discuss any questions you have with your health care provider.   Document Released: 12/24/2007 Document Revised: 07/18/2014 Document Reviewed: 11/22/2010 Elsevier Interactive Patient Education [employer].

## 2015-10-19 NOTE — Progress Notes (Signed)
Pre visit review using our clinic review tool, if applicable. No additional management support is needed unless otherwise documented below in the visit note. 

## 2015-10-19 NOTE — Telephone Encounter (Signed)
Spoke to patient and informed him of his labs. His A1c has increased from 7.0 to 7.7. He is going to work on diet as he does not want to go on another medication at this time.

## 2015-10-25 ENCOUNTER — Other Ambulatory Visit: Payer: Self-pay | Admitting: Adult Health

## 2015-10-26 ENCOUNTER — Other Ambulatory Visit: Payer: Self-pay | Admitting: Adult Health

## 2015-10-26 ENCOUNTER — Ambulatory Visit
Admission: RE | Admit: 2015-10-26 | Discharge: 2015-10-26 | Disposition: A | Discharge status: Home / Self Care | Payer: Medicare Other | Source: Ambulatory Visit | Attending: Adult Health | Admitting: Adult Health

## 2015-10-26 DIAGNOSIS — R1030 Lower abdominal pain, unspecified: Secondary | ICD-10-CM | POA: Diagnosis not present

## 2015-10-26 DIAGNOSIS — R1031 Right lower quadrant pain: Secondary | ICD-10-CM

## 2015-11-15 ENCOUNTER — Other Ambulatory Visit: Payer: Self-pay | Admitting: Adult Health

## 2015-11-17 NOTE — Telephone Encounter (Signed)
Ok to refill 

## 2015-11-17 NOTE — Telephone Encounter (Signed)
I think it was refilled yesterday?

## 2015-11-17 NOTE — Telephone Encounter (Signed)
Ok to refill for 6 months 

## 2015-11-22 ENCOUNTER — Other Ambulatory Visit: Payer: Self-pay | Admitting: Adult Health

## 2015-11-23 NOTE — Telephone Encounter (Signed)
Ok to refill for 6 months 

## 2015-12-01 ENCOUNTER — Ambulatory Visit (INDEPENDENT_AMBULATORY_CARE_PROVIDER_SITE_OTHER): Payer: Medicare Other | Admitting: *Deleted

## 2015-12-01 DIAGNOSIS — I429 Cardiomyopathy, unspecified: Secondary | ICD-10-CM

## 2015-12-01 DIAGNOSIS — I5042 Chronic combined systolic (congestive) and diastolic (congestive) heart failure: Secondary | ICD-10-CM

## 2015-12-01 DIAGNOSIS — Z95810 Presence of automatic (implantable) cardiac defibrillator: Secondary | ICD-10-CM

## 2015-12-01 DIAGNOSIS — I428 Other cardiomyopathies: Secondary | ICD-10-CM

## 2015-12-01 NOTE — Progress Notes (Signed)
Remote ICD transmission.   

## 2015-12-13 ENCOUNTER — Other Ambulatory Visit: Payer: Self-pay | Admitting: Adult Health

## 2015-12-15 NOTE — Telephone Encounter (Signed)
Ok to refill, 90 pills, 1 refill  

## 2015-12-16 ENCOUNTER — Ambulatory Visit (INDEPENDENT_AMBULATORY_CARE_PROVIDER_SITE_OTHER): Payer: Medicare Other | Admitting: Pulmonary Disease

## 2015-12-16 ENCOUNTER — Encounter: Payer: Self-pay | Admitting: Pulmonary Disease

## 2015-12-16 VITALS — BP 102/66 | HR 74 | Ht 74.0 in | Wt 201.6 lb

## 2015-12-16 DIAGNOSIS — C3412 Malignant neoplasm of upper lobe, left bronchus or lung: Secondary | ICD-10-CM | POA: Diagnosis not present

## 2015-12-16 DIAGNOSIS — J449 Chronic obstructive pulmonary disease, unspecified: Secondary | ICD-10-CM

## 2015-12-16 DIAGNOSIS — J9611 Chronic respiratory failure with hypoxia: Secondary | ICD-10-CM

## 2015-12-16 NOTE — Patient Instructions (Signed)
Stay on Symbicort Continue to use oxygen during walking and during sleep

## 2015-12-16 NOTE — Assessment & Plan Note (Signed)
Stay on Symbicort

## 2015-12-16 NOTE — Assessment & Plan Note (Signed)
Surveillance CT set up for 12/2015

## 2015-12-16 NOTE — Progress Notes (Signed)
   Subjective:    Patient ID: [name], male    DOB: [birth year], 70 y.o.   MRN: [id number]  HPI  70 year old male smoker with ischemic cardiomyopathy for followup of COPD, hypoxia and lung cancer.  He has a 40+ PY hx of smoking , quit 2015 No discernible benefit from Advair (was on for several years) , now on Symbicort   11/2013 left VATS with wedge resection of the left upper lobe - Dr. Roxy Manns.-invasive well-differentiated adenocarcinoma , neg LNs, close margins but no involvement  12/16/2015  Chief Complaint  Patient presents with  . Follow-up    breathing is doing fine, no concerns.    Continues to follow with Dr. Earlie Server for history of non-small lung cancer status post wedge resection of the left upper lobe in May 2015.  A CT scan in 06/2015 did not show any recurrence of malignancy-he is due for another CT scan this month.  His breathing has remained stable, he had no flareups over the last 2 years, he is compliant with Symbicort  His oxygen level stays above 90% at rest but per his report he desaturates to 80% on walking without oxygen   Significant tests/ events :  Admitted 08/2013 >> dc'd on O2 Echo 08/2013 -EF 30-35% akinesis of the apex and multiple wall motion abnormalities. RV showed reduced systolic function .  Cath 08/16/13 PCWP 25, Mean RA 11 mmHg  RV 54/5 mmHg  PA: 56/29 mean 39 mmHg  CO: 3.39  He had  pleural disease on right on CT chest & LUL 1.5 cm oblong opacity worrisome for malignancy   PFTs 09/2013 - FEV1 2.18- 61%, ratio 64, FVC 3.40 -71%, DLCO 11.0- 32%  CPET 11/20/13 FEV1 2.28 -58%, MVV 105 (71%), peak V O2 12.7, moderate-to-severe functional limitation due primarily to a circulatory limitation        Review of Systems Patient denies significant dyspnea,cough, hemoptysis,  chest pain, palpitations, pedal edema, orthopnea, paroxysmal nocturnal dyspnea, lightheadedness, nausea, vomiting, abdominal or  leg pains      Objective:   Physical  Exam  Gen. Pleasant, well-nourished, in no distress ENT -  no post nasal drip, nasal voice Neck: No JVD, no thyromegaly, no carotid bruits Lungs: no use of accessory muscles, no dullness to percussion, decreased without rales or rhonchi  Cardiovascular: Rhythm regular, heart sounds  normal, no murmurs or gallops, no peripheral edema Musculoskeletal: No deformities, no cyanosis or clubbing        Assessment & Plan:

## 2015-12-16 NOTE — Assessment & Plan Note (Signed)
Continue to use 2l  oxygen during walking and during sleep

## 2015-12-19 ENCOUNTER — Other Ambulatory Visit: Payer: Self-pay | Admitting: Adult Health

## 2015-12-21 LAB — CUP PACEART REMOTE DEVICE CHECK
Battery Voltage: 2.93 V
HIGH POWER IMPEDANCE MEASURED VALUE: 75 Ohm
HighPow Impedance: 75 Ohm
Implantable Lead Implant Date: 20100113
Implantable Lead Location: 753860
Lead Channel Impedance Value: 380 Ohm
Lead Channel Impedance Value: 890 Ohm
Lead Channel Pacing Threshold Amplitude: 1.125 V
Lead Channel Pacing Threshold Pulse Width: 0.5 ms
Lead Channel Setting Pacing Amplitude: 2.125
Lead Channel Setting Pacing Pulse Width: 0.5 ms
MDC IDC LEAD IMPLANT DT: 20100113
MDC IDC LEAD LOCATION: 753858
MDC IDC LEAD MODEL: 1158
MDC IDC LEAD MODEL: 7122
MDC IDC MSMT BATTERY REMAINING LONGEVITY: 53 mo
MDC IDC MSMT BATTERY REMAINING PERCENTAGE: 58 %
MDC IDC MSMT LEADCHNL LV PACING THRESHOLD PULSEWIDTH: 0.5 ms
MDC IDC MSMT LEADCHNL RV PACING THRESHOLD AMPLITUDE: 0.625 V
MDC IDC MSMT LEADCHNL RV SENSING INTR AMPL: 12 mV
MDC IDC SESS DTM: 20170523124448
MDC IDC SET LEADCHNL LV PACING PULSEWIDTH: 0.5 ms
MDC IDC SET LEADCHNL RV PACING AMPLITUDE: 2 V
MDC IDC SET LEADCHNL RV SENSING SENSITIVITY: 0.5 mV
Pulse Gen Serial Number: 7053988

## 2015-12-24 ENCOUNTER — Other Ambulatory Visit: Payer: Self-pay | Admitting: Medical Oncology

## 2015-12-24 DIAGNOSIS — C3412 Malignant neoplasm of upper lobe, left bronchus or lung: Secondary | ICD-10-CM

## 2015-12-25 ENCOUNTER — Ambulatory Visit (HOSPITAL_COMMUNITY)
Admission: RE | Admit: 2015-12-25 | Discharge: 2015-12-25 | Disposition: A | Discharge status: Home / Self Care | Payer: Medicare Other | Source: Ambulatory Visit | Attending: Internal Medicine | Admitting: Internal Medicine

## 2015-12-25 ENCOUNTER — Other Ambulatory Visit (HOSPITAL_BASED_OUTPATIENT_CLINIC_OR_DEPARTMENT_OTHER): Payer: Medicare Other

## 2015-12-25 ENCOUNTER — Telehealth: Payer: Self-pay

## 2015-12-25 ENCOUNTER — Encounter (HOSPITAL_COMMUNITY): Payer: Self-pay

## 2015-12-25 DIAGNOSIS — I517 Cardiomegaly: Secondary | ICD-10-CM | POA: Diagnosis not present

## 2015-12-25 DIAGNOSIS — I2510 Atherosclerotic heart disease of native coronary artery without angina pectoris: Secondary | ICD-10-CM | POA: Diagnosis not present

## 2015-12-25 DIAGNOSIS — C3412 Malignant neoplasm of upper lobe, left bronchus or lung: Secondary | ICD-10-CM | POA: Insufficient documentation

## 2015-12-25 DIAGNOSIS — M438X6 Other specified deforming dorsopathies, lumbar region: Secondary | ICD-10-CM | POA: Diagnosis not present

## 2015-12-25 DIAGNOSIS — N62 Hypertrophy of breast: Secondary | ICD-10-CM | POA: Diagnosis not present

## 2015-12-25 DIAGNOSIS — R911 Solitary pulmonary nodule: Secondary | ICD-10-CM | POA: Diagnosis not present

## 2015-12-25 DIAGNOSIS — C220 Liver cell carcinoma: Secondary | ICD-10-CM

## 2015-12-25 DIAGNOSIS — I7789 Other specified disorders of arteries and arterioles: Secondary | ICD-10-CM | POA: Diagnosis not present

## 2015-12-25 DIAGNOSIS — R590 Localized enlarged lymph nodes: Secondary | ICD-10-CM | POA: Insufficient documentation

## 2015-12-25 LAB — CBC WITH DIFFERENTIAL/PLATELET
BASO%: 1 % (ref 0.0–2.0)
Basophils Absolute: 0.1 10*3/uL (ref 0.0–0.1)
EOS ABS: 0.1 10*3/uL (ref 0.0–0.5)
EOS%: 2.1 % (ref 0.0–7.0)
HCT: 45.6 % (ref 38.4–49.9)
HEMOGLOBIN: 15 g/dL (ref 13.0–17.1)
LYMPH%: 11.4 % — AB (ref 14.0–49.0)
MCH: 31 pg (ref 27.2–33.4)
MCHC: 32.8 g/dL (ref 32.0–36.0)
MCV: 94.5 fL (ref 79.3–98.0)
MONO#: 0.6 10*3/uL (ref 0.1–0.9)
MONO%: 10.7 % (ref 0.0–14.0)
NEUT%: 74.8 % (ref 39.0–75.0)
NEUTROS ABS: 4.5 10*3/uL (ref 1.5–6.5)
PLATELETS: 136 10*3/uL — AB (ref 140–400)
RBC: 4.83 10*6/uL (ref 4.20–5.82)
RDW: 15 % — AB (ref 11.0–14.6)
WBC: 6 10*3/uL (ref 4.0–10.3)
lymph#: 0.7 10*3/uL — ABNORMAL LOW (ref 0.9–3.3)

## 2015-12-25 LAB — COMPREHENSIVE METABOLIC PANEL
ALBUMIN: 3.9 g/dL (ref 3.5–5.0)
ALK PHOS: 59 U/L (ref 40–150)
ALT: 10 U/L (ref 0–55)
ANION GAP: 9 meq/L (ref 3–11)
AST: 12 U/L (ref 5–34)
BUN: 22.2 mg/dL (ref 7.0–26.0)
CALCIUM: 9.2 mg/dL (ref 8.4–10.4)
CHLORIDE: 102 meq/L (ref 98–109)
CO2: 28 mEq/L (ref 22–29)
Creatinine: 1 mg/dL (ref 0.7–1.3)
EGFR: 74 mL/min/{1.73_m2} — AB (ref >90)
Glucose: 178 mg/dl — ABNORMAL HIGH (ref 70–140)
POTASSIUM: 4.4 meq/L (ref 3.5–5.1)
Sodium: 139 mEq/L (ref 136–145)
Total Bilirubin: 2.77 mg/dL — ABNORMAL HIGH (ref 0.20–1.20)
Total Protein: 6.9 g/dL (ref 6.4–8.3)

## 2015-12-25 MED ORDER — IOPAMIDOL (ISOVUE-300) INJECTION 61%
75.0000 mL | Freq: Once | INTRAVENOUS | Status: AC | PRN
  Administered 2015-12-25: 75 mL via INTRAVENOUS

## 2015-12-25 NOTE — Telephone Encounter (Signed)
-----   Message from Friend sent at 06/26/2015  1:38 PM EST ----- [place] in 6 months for hepatoma screening.

## 2015-12-29 ENCOUNTER — Telehealth: Payer: Self-pay | Admitting: Internal Medicine

## 2015-12-29 ENCOUNTER — Ambulatory Visit (HOSPITAL_BASED_OUTPATIENT_CLINIC_OR_DEPARTMENT_OTHER): Payer: Medicare Other | Admitting: Internal Medicine

## 2015-12-29 ENCOUNTER — Encounter: Payer: Self-pay | Admitting: Cardiology

## 2015-12-29 ENCOUNTER — Encounter: Payer: Self-pay | Admitting: Internal Medicine

## 2015-12-29 VITALS — BP 111/61 | HR 75 | Temp 97.0°F | Resp 18 | Ht 74.0 in | Wt 200.8 lb

## 2015-12-29 DIAGNOSIS — Z85118 Personal history of other malignant neoplasm of bronchus and lung: Secondary | ICD-10-CM

## 2015-12-29 DIAGNOSIS — C3412 Malignant neoplasm of upper lobe, left bronchus or lung: Secondary | ICD-10-CM

## 2015-12-29 NOTE — Telephone Encounter (Signed)
S.w. Pt wife and advised on June 2018

## 2015-12-29 NOTE — Progress Notes (Signed)
[facility] [phone]   [phone]  OFFICE PROGRESS NOTE  [name], NP [place] [address]  DIAGNOSIS: Stage IA (T1a, N0, M0) non-small cell lung cancer, well-differentiated adenocarcinoma of the left upper lobe diagnosed in February of 2015   PRIOR THERAPY: Status post wedge resection of the left upper lobe under the care of Dr. Roxy Manns on 11/29/2013.  CURRENT THERAPY: Observation.  INTERVAL HISTORY: [name] 70 y.o. male returns to the clinic today for six-month follow-up visit. The patient is feeling fine today with no specific complaints except for the baseline shortness of breath and he is currently on home oxygen. He denied having any significant weight loss or night sweats. The patient denied having any nausea, vomiting, abdominal pain, diarrhea or constipation. He has no chest pain, cough or hemoptysis. He was evaluated in the past by gastroenterology for the persistent elevated serum bilirubin. The patient has repeat CT scan of the chest performed recently and he is here for evaluation and discussion of his scan results.  MEDICAL HISTORY: Past Medical History  Diagnosis Date  . Hypertension   . Endocarditis     Bacterial, 2009  . Colon polyps   . Dyslipidemia   . Atrial fibrillation ([place])     AV Node ablation January, 2010, for rapid atrial fib  . Cardiomyopathy     non-ischemic  . Ejection fraction < 50%   . Atrial septal defect     Closed with surgery January, 2010  . Mitral regurgitation     Severe symptomatic primary MR due to bacterial endocarditis, treated w/ MVR  . Intracranial hemorrhage (HCC)     Coumadin cannot be used because of the history of his bleed  . Renal artery stenosis (HCC)     Mild by history  . Pulmonary hypertension (HCC)     Moderate  . HLD (hyperlipidemia)   . Automatic implantable cardioverter-defibrillator in situ     LV dysfunction and pacer needed for AV node  lesion  . CVA (cerebral vascular accident) ([place]) 2009    denies residual on 08/14/2013  . CHF (congestive heart failure) ([place])   . Myocardial infarction ([place]) 2010  . Dysrhythmia   . Pneumonia     "this is my first case" (08/14/2013)  . Spontaneous pneumothorax     right thoracotomy - distant past  . Permanent atrial fibrillation     Originally Coumadin use for atrial fibrillation  //   he had intracerebral hemorrhage with an INR of 2.3 June, 2009. Anticoagulation could no longer be used.  //  Rapid atrial fibrillation after inferior MI October, 2010..........Marland Kitchen AV node ablation done at that time with ICD pacemaker placed (EF 35%).   //   Left atrial appendage tied off at the time of mitral valve surgery January, 2010 (maze pro  . Status post minimally invasive mitral valve replacement with bioprosthetic valve     33 mm Medtronic Mosaic porcine bioprosthesis placed via right mini thoracotomy for bacterial endocarditis complicated by severe MR and CHF   . Prosthetic valve dysfunction     Mild mitral stenosis  . Chronic combined systolic and diastolic CHF (congestive heart failure) ([place])   . Pacemaker   . Diabetes mellitus without complication ([place])   . Headache(784.0)     related to stroke only  . COPD (chronic obstructive pulmonary disease) (HCC)     O2- 2 liters, nasal cannula, q night   . COPD  GOLD II 01/11/2007    PFT's 09/25/13  FEV1  2.18 (61%) ratio 64 no change p B2 and DLC0  32% corrects to 68% - trial off advair and acei rec starting  08/23/2013     . Lung cancer ([place]) 11/29/2013    T1N0 Stage Ia non-small cell carcinoma left lung treated with wedge resection    ALLERGIES:  is allergic to anticoagulant compound and warfarin sodium.  MEDICATIONS:  Current Outpatient Prescriptions  Medication Sig Dispense Refill  . aspirin EC 81 MG tablet Take 1 tablet (81 mg total) by mouth daily. 90 tablet 3  . atorvastatin (LIPITOR) 10 MG tablet TAKE 1 TABLET BY MOUTH EVERY DAY 90 tablet 3  .  carvedilol (COREG) 3.125 MG tablet TAKE 1 TABLET BY MOUTH TWICE A DAY WITH MEALS 180 tablet 1  . furosemide (LASIX) 40 MG tablet Take 40 mg by mouth daily. May take 1 extra daily as needed for edema    . irbesartan (AVAPRO) 75 MG tablet TAKE 1 TABLET BY MOUTH EVERY DAY 90 tablet 1  . metFORMIN (GLUCOPHAGE) 1000 MG tablet Take 1 tablet (1,000 mg total) by mouth 2 (two) times daily with a meal. 60 tablet 3  . SYMBICORT 160-4.5 MCG/ACT inhaler INHALE 2 PUFFS INTO THE LUNGS TWICE A DAY 10.2 Inhaler 5  . HYDROcodone-acetaminophen (NORCO/VICODIN) 5-325 MG tablet Take 2 tablets by mouth every 4 (four) hours as needed. (Patient not taking: Reported on 12/29/2015) 10 tablet 0  . nitroGLYCERIN (NITROSTAT) 0.4 MG SL tablet Place 0.4 mg under the tongue every 5 (five) minutes as needed for chest pain (x 3 doses). Reported on 12/29/2015     No current facility-administered medications for this visit.    SURGICAL HISTORY:  Past Surgical History  Procedure Laterality Date  . Mitral valve replacement Right 07/17/2008    31m Medtronic Mosaic porcine bioprosthesis  . Cardiac defibrillator placement  ~ 2010    [employer] Jude  . Penile prosthesis implant    . Appendectomy    . Mass biopsy Left     neck mass  . Leep    . Cataract extraction w/ intraocular lens  implant, bilateral Bilateral   . Asd repair, secundum  07/17/2008    pericardial patch closure of ASD  . Av node ablation  07/2008    for rapid atrial fib  . Thoracotomy Right 1970's    spontaneous pneumothorax - while in the [employer] . Tonsillectomy    . Insert / replace / remove pacemaker    . Cardiac valve replacement    . Cardiac catheterization    . Video assisted thoracoscopy (vats)/wedge resection Left 11/29/2013    Procedure: Video assisted thoracoscopy for wedge resection; mini thoracotomy;  Surgeon: CRexene Alberts MD;  [facility]  Service: Thoracic;  Laterality: Left;  . Implantable cardioverter defibrillator (icd) generator change N/A  02/06/2012    Procedure: ICD GENERATOR CHANGE;  Surgeon: GEvans Lance MD;  [facility];  Service: Cardiovascular;  Laterality: N/A;  . Right heart catheterization N/A 08/16/2013    Procedure: RIGHT HEART CATH;  Surgeon: PJosue Hector MD;  [facility];  Service: Cardiovascular;  Laterality: N/A;    REVIEW OF SYSTEMS:  A comprehensive review of systems was negative except for: Respiratory: positive for dyspnea on exertion   PHYSICAL EXAMINATION: General appearance: alert, cooperative, fatigued and no distress Head: Normocephalic, without obvious abnormality, atraumatic Neck: no adenopathy, no JVD, supple, symmetrical, trachea midline and thyroid not  enlarged, symmetric, no tenderness/mass/nodules Lymph nodes: Cervical, supraclavicular, and axillary nodes normal. Resp: clear to auscultation bilaterally Back: negative, symmetric, no curvature. ROM normal. No CVA tenderness. Cardio: regular rate and rhythm, S1, S2 normal, no murmur, click, rub or gallop GI: soft, non-tender; bowel sounds normal; no masses,  no organomegaly Extremities: extremities normal, atraumatic, no cyanosis or edema  ECOG PERFORMANCE STATUS: 1 - Symptomatic but completely ambulatory  Blood pressure 111/61, pulse 75, temperature 97 F (36.1 C), temperature source Oral, resp. rate 18, height '6\' 2"'$  (1.88 m), weight 200 lb 12.8 oz (91.082 kg), SpO2 95 %.  LABORATORY DATA: Lab Results  Component Value Date   WBC 6.0 12/25/2015   HGB 15.0 12/25/2015   HCT 45.6 12/25/2015   MCV 94.5 12/25/2015   PLT 136* 12/25/2015      Chemistry      Component Value Date/Time   NA 139 12/25/2015 0747   NA 139 10/19/2015 1050   K 4.4 12/25/2015 0747   K 4.4 10/19/2015 1050   CL 99 10/19/2015 1050   CO2 28 12/25/2015 0747   CO2 32 10/19/2015 1050   BUN 22.2 12/25/2015 0747   BUN 20 10/19/2015 1050   CREATININE 1.0 12/25/2015 0747   CREATININE 0.95 10/19/2015 1050      Component Value Date/Time   CALCIUM  9.2 12/25/2015 0747   CALCIUM 9.7 10/19/2015 1050   ALKPHOS 59 12/25/2015 0747   ALKPHOS 69 10/19/2015 1050   AST 12 12/25/2015 0747   AST 14 10/19/2015 1050   ALT 10 12/25/2015 0747   ALT 13 10/19/2015 1050   BILITOT 2.77* 12/25/2015 0747   BILITOT 2.2* 10/19/2015 1050       RADIOGRAPHIC STUDIES: Ct Chest W Contrast  12/25/2015  CLINICAL DATA:  Left-sided lung cancer diagnosed in 2014 with surgery only. Left-sided lobectomy. EXAM: CT CHEST WITH CONTRAST TECHNIQUE: Multidetector CT imaging of the chest was performed during intravenous contrast administration. CONTRAST:  66m ISOVUE-300 IOPAMIDOL (ISOVUE-300) INJECTION 61% COMPARISON:  Chest radiograph of 09/25/2015. Most recent chest CT of 06/22/2015. FINDINGS: Mediastinum/Nodes: No supraclavicular adenopathy. Aortic atherosclerosis. Pacer. Tortuous thoracic aorta. Moderate cardiomegaly with moderate to marked left atrial enlargement. Multivessel coronary artery atherosclerosis. Pulmonary artery enlargement, including 3.4 cm right pulmonary artery. No central pulmonary embolism, on this non-dedicated study. Node within the subcarinal station with extension into the azygoesophageal recess measures 11 mm and is similar. There is also borderline right hilar adenopathy at 1.3 cm on image 78/series 2, similar. Lungs/Pleura: Minimal right-sided pleural thickening. Advanced bullous type emphysema. Surgical sutures in the left upper lobe, without locally recurrent disease. 2 mm left upper lobe pulmonary nodule on image 49/ series 5 is not significantly changed. Left base nodules likely represent calcified granulomas and are at similar, on the order of 2 mm or less. Upper abdomen: Normal imaged portions of the liver, spleen, stomach, pancreas, gallbladder, biliary tract,, right kidney. Incompletely imaged low-density left renal lesion is likely a cyst. Abdominal aortic atherosclerosis. Musculoskeletal: Mild left-sided gynecomastia. Mild L1 superior endplate  irregularity with sclerosis, new. IMPRESSION: 1. Status post left upper lobe wedge resection, without recurrent or metastatic disease. 2. A left upper lobe tiny pulmonary nodule is unchanged. 3. Borderline to mild thoracic adenopathy, similar and likely reactive. 4. Cardiomegaly with atherosclerosis, including the coronary arteries. 5. Pulmonary artery enlargement suggests pulmonary arterial hypertension. 6. Left-sided gynecomastia. 7. New mild superior endplate compression deformity at L1. Electronically Signed   By: KAbigail MiyamotoM.D.   On: 12/25/2015 09:33  ASSESSMENT AND PLAN: This is a very pleasant 70 years old white male recently diagnosed with a stage IA non-small cell lung cancer status post wedge resection of the left upper lobe and he is currently on observation. The recent CT scan of the chest showed no evidence for disease recurrence.  I discussed the scan results with the patient. I recommended for him to continue on observation with repeat CT scan of the chest in one year. The patient has elevated serum bilirubin for the last few years of unclear etiology. He is followed by gastroenterology.  He was advised to call immediately if he has any concerning symptoms in the interval. The patient voices understanding of current disease status and treatment options and is in agreement with the current care plan.  All questions were answered. The patient knows to call the clinic with any problems, questions or concerns. We can certainly see the patient much sooner if necessary.  Disclaimer: This note was dictated with voice recognition software. Similar sounding words can inadvertently be transcribed and may not be corrected upon review.

## 2016-01-01 NOTE — Telephone Encounter (Signed)
You have been scheduled for an abdominal ultrasound at [facility] (1st floor of hospital) on 01/08/16 at 930 am. Please arrive 15 minutes prior to your appointment for registration. Make certain not to have anything to eat or drink 6 hours prior to your appointment. Should you need to reschedule your appointment, please contact radiology at [phone]. This test typically takes about 30 minutes to perform.   The pt has been notified and verbalized all instructions

## 2016-01-01 NOTE — Telephone Encounter (Signed)
Left message on machine to call back  

## 2016-01-04 ENCOUNTER — Encounter: Payer: Self-pay | Admitting: Cardiology

## 2016-01-04 ENCOUNTER — Ambulatory Visit (INDEPENDENT_AMBULATORY_CARE_PROVIDER_SITE_OTHER): Payer: Medicare Other | Admitting: Cardiology

## 2016-01-04 VITALS — BP 134/66 | HR 79 | Ht 74.0 in | Wt 203.1 lb

## 2016-01-04 DIAGNOSIS — I429 Cardiomyopathy, unspecified: Secondary | ICD-10-CM

## 2016-01-04 DIAGNOSIS — Z953 Presence of xenogenic heart valve: Secondary | ICD-10-CM

## 2016-01-04 DIAGNOSIS — I5042 Chronic combined systolic (congestive) and diastolic (congestive) heart failure: Secondary | ICD-10-CM

## 2016-01-04 DIAGNOSIS — Z95810 Presence of automatic (implantable) cardiac defibrillator: Secondary | ICD-10-CM

## 2016-01-04 DIAGNOSIS — Z8679 Personal history of other diseases of the circulatory system: Secondary | ICD-10-CM

## 2016-01-04 DIAGNOSIS — I428 Other cardiomyopathies: Secondary | ICD-10-CM

## 2016-01-04 NOTE — Progress Notes (Signed)
Cardiology Office Note    Date:  01/04/2016   ID:  [name], DOB 1946/03/08, MRN [id number]  PCP:  [name], NP  Cardiologist:   Candee Furbish, MD (former Dr. Ron Parker)    History of Present Illness:  [name] is a 70 y.o. male former Dr. Ron Parker patient here for follow-up of mitral valve disease, bioprosthetic valve in mitral position. as well as atrial fibrillation. He also had AV node ablation, pacemaker. A fibrillation in the past could not be rate controlled. No anticoagulation because of prior CNS bleed. He had prior left atrial appendage occlusion/tied.Therefore, not on chronic anticoagulation.  He was recently admitted 3/16 09/27/15 for near syncope. Orthostatic hypotension was noted. Since his discharge he had been doing well.  Bilirubin has been chronically increased- apparently is having an ultrasound of his liver.  He developed lung cancer and is responded to treatments. Dr. Earlie Server. Continuous oxygen. Resection.  ICD in place January 2010 several days after his mitral valve surgery. Most recent ejection fraction was 35% in 2015. No CAD 12/8/9 cath.  Overall these feeling well without any shortness of breath, no orthopnea, no chest pain, no syncope, no bleeding.    Past Medical History  Diagnosis Date  . Hypertension   . Endocarditis     Bacterial, 2009  . Colon polyps   . Dyslipidemia   . Atrial fibrillation ([place])     AV Node ablation January, 2010, for rapid atrial fib  . Cardiomyopathy     non-ischemic  . Ejection fraction < 50%   . Atrial septal defect     Closed with surgery January, 2010  . Mitral regurgitation     Severe symptomatic primary MR due to bacterial endocarditis, treated w/ MVR  . Intracranial hemorrhage (HCC)     Coumadin cannot be used because of the history of his bleed  . Renal artery stenosis (HCC)     Mild by history  . Pulmonary hypertension (HCC)     Moderate  . HLD (hyperlipidemia)   . Automatic implantable  cardioverter-defibrillator in situ     LV dysfunction and pacer needed for AV node lesion  . CVA (cerebral vascular accident) ([place]) 2009    denies residual on 08/14/2013  . CHF (congestive heart failure) ([place])   . Myocardial infarction ([place]) 2010  . Dysrhythmia   . Pneumonia     "this is my first case" (08/14/2013)  . Spontaneous pneumothorax     right thoracotomy - distant past  . Permanent atrial fibrillation     Originally Coumadin use for atrial fibrillation  //   he had intracerebral hemorrhage with an INR of 2.3 June, 2009. Anticoagulation could no longer be used.  //  Rapid atrial fibrillation after inferior MI October, 2010..........Marland Kitchen AV node ablation done at that time with ICD pacemaker placed (EF 35%).   //   Left atrial appendage tied off at the time of mitral valve surgery January, 2010 (maze pro  . Status post minimally invasive mitral valve replacement with bioprosthetic valve     33 mm Medtronic Mosaic porcine bioprosthesis placed via right mini thoracotomy for bacterial endocarditis complicated by severe MR and CHF   . Prosthetic valve dysfunction     Mild mitral stenosis  . Chronic combined systolic and diastolic CHF (congestive heart failure) ([place])   . Pacemaker   . Diabetes mellitus without complication ([place])   . Headache(784.0)     related to stroke only  . COPD (chronic obstructive  pulmonary disease) (HCC)     O2- 2 liters, nasal cannula, q night   . COPD GOLD II 01/11/2007    PFT's 09/25/13  FEV1  2.18 (61%) ratio 64 no change p B2 and DLC0  32% corrects to 68% - trial off advair and acei rec starting  08/23/2013     . Lung cancer ([place]) 11/29/2013    T1N0 Stage Ia non-small cell carcinoma left lung treated with wedge resection    Past Surgical History  Procedure Laterality Date  . Mitral valve replacement Right 07/17/2008    85m Medtronic Mosaic porcine bioprosthesis  . Cardiac defibrillator placement  ~ 2010    St Jude  . Penile prosthesis implant    . Appendectomy     . Mass biopsy Left     neck mass  . Leep    . Cataract extraction w/ intraocular lens  implant, bilateral Bilateral   . Asd repair, secundum  07/17/2008    pericardial patch closure of ASD  . Av node ablation  07/2008    for rapid atrial fib  . Thoracotomy Right 1970's    spontaneous pneumothorax - while in the [employer] . Tonsillectomy    . Insert / replace / remove pacemaker    . Cardiac valve replacement    . Cardiac catheterization    . Video assisted thoracoscopy (vats)/wedge resection Left 11/29/2013    Procedure: Video assisted thoracoscopy for wedge resection; mini thoracotomy;  Surgeon: CRexene Alberts MD;  [facility]  Service: Thoracic;  Laterality: Left;  . Implantable cardioverter defibrillator (icd) generator change N/A 02/06/2012    Procedure: ICD GENERATOR CHANGE;  Surgeon: GEvans Lance MD;  [facility];  Service: Cardiovascular;  Laterality: N/A;  . Right heart catheterization N/A 08/16/2013    Procedure: RIGHT HEART CATH;  Surgeon: PJosue Hector MD;  [facility];  Service: Cardiovascular;  Laterality: N/A;    Outpatient Prescriptions Prior to Visit  Medication Sig Dispense Refill  . aspirin EC 81 MG tablet Take 1 tablet (81 mg total) by mouth daily. 90 tablet 3  . atorvastatin (LIPITOR) 10 MG tablet TAKE 1 TABLET BY MOUTH EVERY DAY 90 tablet 3  . carvedilol (COREG) 3.125 MG tablet TAKE 1 TABLET BY MOUTH TWICE A DAY WITH MEALS 180 tablet 1  . furosemide (LASIX) 40 MG tablet Take 40 mg by mouth daily. May take 1 extra daily as needed for edema    . irbesartan (AVAPRO) 75 MG tablet TAKE 1 TABLET BY MOUTH EVERY DAY 90 tablet 1  . metFORMIN (GLUCOPHAGE) 1000 MG tablet Take 1 tablet (1,000 mg total) by mouth 2 (two) times daily with a meal. 60 tablet 3  . nitroGLYCERIN (NITROSTAT) 0.4 MG SL tablet Place 0.4 mg under the tongue every 5 (five) minutes as needed for chest pain (x 3 doses). Reported on 12/29/2015    . SYMBICORT 160-4.5 MCG/ACT inhaler  INHALE 2 PUFFS INTO THE LUNGS TWICE A DAY 10.2 Inhaler 5  . HYDROcodone-acetaminophen (NORCO/VICODIN) 5-325 MG tablet Take 2 tablets by mouth every 4 (four) hours as needed. (Patient not taking: Reported on 12/29/2015) 10 tablet 0   No facility-administered medications prior to visit.     Allergies:   Anticoagulant compound and Warfarin sodium   Social History   Social History  . Marital Status: Married    Spouse Name: JGregary Signs . Number of Children: 0  . Years of Education: College   Occupational History  .  Retired     Tour manager   Social History Main Topics  . Smoking status: Former Smoker -- 1.00 packs/day for 45 years    Types: Cigarettes    Quit date: 08/11/2013  . Smokeless tobacco: Never Used     Comment: 08/14/2013 "quit smoking in 2009"  . Alcohol Use: No     Comment: 08/14/2013 "used to drink beer; quit:in 1982"  . Drug Use: No  . Sexual Activity: Yes   Other Topics Concern  . None   Social History Narrative   Patient lives at home with his spouse.   Caffeine Use: none   Worked for the post office   Has two boys and a girl. All live local.      Family History:  The patient's family history includes Stomach cancer in his father; Stroke in his mother.   ROS:   Please see the history of present illness.    ROS  no chest pain, no shortness of breath other than baseline, no bleeding. All other systems reviewed and are negative.   PHYSICAL EXAM:   VS:  BP 134/66 mmHg  Pulse 79  Ht '6\' 2"'$  (1.88 m)  Wt 203 lb 1.9 oz (92.135 kg)  BMI 26.07 kg/m2   GEN: Well nourished, well developed, in no acute distress HEENT: normal Neck: no JVD, carotid bruits, or masses Cardiac: RRR; no murmurs, rubs, or gallops,no edema  Respiratory:  clear to auscultation bilaterally, normal work of breathing, home oxygen noted GI: soft, nontender, nondistended, + BS MS: no deformity or atrophy Skin: warm and dry, no rash, varicose veins noted, chronic Neuro:  Alert and Oriented x 3,  Strength and sensation are intact Psych: euthymic mood, full affect  Wt Readings from Last 3 Encounters:  01/04/16 203 lb 1.9 oz (92.135 kg)  12/29/15 200 lb 12.8 oz (91.082 kg)  12/16/15 201 lb 9.6 oz (91.445 kg)      Studies/Labs Reviewed:   EKG:  EKG is not ordered today.    Recent Labs: 09/24/2015: B Natriuretic Peptide 285.0*; TSH 0.592 12/25/2015: ALT 10; BUN 22.2; Creatinine 1.0; HGB 15.0; Platelets 136*; Potassium 4.4; Sodium 139   Lipid Panel    Component Value Date/Time   CHOL 107 10/19/2015 1050   TRIG 164.0* 10/19/2015 1050   HDL 35.40* 10/19/2015 1050   CHOLHDL 3 10/19/2015 1050   VLDL 32.8 10/19/2015 1050   LDLCALC 39 10/19/2015 1050   LDLDIRECT 80.2 03/31/2011 1411    Additional studies/ records that were reviewed today include:  Prior office notes reviewed, lab work reviewed    ASSESSMENT:    1. Cardiomyopathy, nonischemic ([place])   2. Chronic combined systolic and diastolic CHF (congestive heart failure) ([place])   3. ICD (implantable cardioverter-defibrillator), biventricular, in situ   4. Status post minimally invasive mitral valve replacement with bioprosthetic valve   5. H/O intracranial hemorrhage      PLAN:  In order of problems listed above:  1. Chronic atrial fibrillation-Overall doing well. Unfortunately cannot anticoagulate secondary to history of intracranial hemorrhage. Had rapid atrial fibrillation with inferior MI in October 2010 rate control was always an issue therefore he went AV nodal ablation with Bi-V-ICD placement. He is pacemaker dependent. His atrial appendage was then tied off when he had mitral valve surgery thankfully.So markedly reduce his risk of stroke. 2. Chronic systolic heart failure-ejection fraction 35%. Stable fluid balance. Weight has been stable. Understands fluid restrictions. Good medications. 3. Orthostatic hypotension-hospitalized essentially for this. Improved. 4. know to take  antibiotics prior to dental work. Had  previous bacterial endocarditis, gated by severe MR. Very stable. Coronary arteries were normal prior to valve surgery. 2010. 5. Prior intracranial bleed occurred in 2009 with INR of 2.3. After that point, he was no longer anticoagulated. 6. Has a history of stage IA non-small cell lung carcinoma left wedge resection. Home oxygen. 7. Elevated bilirubin-he is having a liver ultrasound performed.    Medication Adjustments/Labs and Tests Ordered: Current medicines are reviewed at length with the patient today.  Concerns regarding medicines are outlined above.  Medication changes, Labs and Tests ordered today are listed in the Patient Instructions below. Patient Instructions  Medication Instructions:  The current medical regimen is effective;  continue present plan and medications.  Follow-Up: Follow up in 6 months with [name], PA.  You will receive a letter in the mail 2 months before you are due.  Please call us when you receive this letter to schedule your follow up appointment.  If you need a refill on your cardiac medications before your next appointment, please call your pharmacy.  Thank you for choosing [facility]!!             Signed, Candee Furbish, MD  01/04/2016 2:44 PM    [address], [place], [place]  [address] Phone: [phone]; Fax: [phone]

## 2016-01-04 NOTE — Patient Instructions (Signed)
Medication Instructions:  The current medical regimen is effective;  continue present plan and medications.  Follow-Up: Follow up in 6 months with [name], PA.  You will receive a letter in the mail 2 months before you are due.  Please call us when you receive this letter to schedule your follow up appointment.  If you need a refill on your cardiac medications before your next appointment, please call your pharmacy.  Thank you for choosing [facility]!!

## 2016-01-08 ENCOUNTER — Ambulatory Visit (HOSPITAL_COMMUNITY)
Admission: RE | Admit: 2016-01-08 | Discharge: 2016-01-08 | Disposition: A | Discharge status: Home / Self Care | Payer: Medicare Other | Source: Ambulatory Visit | Attending: Gastroenterology | Admitting: Gastroenterology

## 2016-01-08 DIAGNOSIS — N281 Cyst of kidney, acquired: Secondary | ICD-10-CM | POA: Insufficient documentation

## 2016-01-08 DIAGNOSIS — C220 Liver cell carcinoma: Secondary | ICD-10-CM | POA: Diagnosis not present

## 2016-01-08 DIAGNOSIS — K7460 Unspecified cirrhosis of liver: Secondary | ICD-10-CM | POA: Diagnosis not present

## 2016-01-19 ENCOUNTER — Encounter: Payer: Self-pay | Admitting: Adult Health

## 2016-01-19 ENCOUNTER — Ambulatory Visit (INDEPENDENT_AMBULATORY_CARE_PROVIDER_SITE_OTHER): Payer: Medicare Other | Admitting: Adult Health

## 2016-01-19 VITALS — BP 110/62 | HR 76 | Temp 97.6°F | Ht 74.0 in | Wt 202.0 lb

## 2016-01-19 DIAGNOSIS — E119 Type 2 diabetes mellitus without complications: Secondary | ICD-10-CM | POA: Diagnosis not present

## 2016-01-19 LAB — POCT GLYCOSYLATED HEMOGLOBIN (HGB A1C): HEMOGLOBIN A1C: 7.2

## 2016-01-19 NOTE — Progress Notes (Signed)
Subjective:    Patient ID: [name], male    DOB: 1946/04/01, 71 y.o.   MRN: [id number]  HPI  70 year old male who presents to the office today for three month follow up regarding diabetes. His last A1c was 7.7 three months ago. He reports that he has been staying away from carbs and sweets.   He is checking his blood sugars at home periodically. He denies any acute issues and has no complaints.     Review of Systems  Constitutional: Negative.   Respiratory: Negative.   Cardiovascular: Negative.   Genitourinary: Negative.   Neurological: Negative.   Psychiatric/Behavioral: Negative.   All other systems reviewed and are negative.  Past Medical History  Diagnosis Date  . Hypertension   . Endocarditis     Bacterial, 2009  . Colon polyps   . Dyslipidemia   . Atrial fibrillation ([place])     AV Node ablation January, 2010, for rapid atrial fib  . Cardiomyopathy     non-ischemic  . Ejection fraction < 50%   . Atrial septal defect     Closed with surgery January, 2010  . Mitral regurgitation     Severe symptomatic primary MR due to bacterial endocarditis, treated w/ MVR  . Intracranial hemorrhage (HCC)     Coumadin cannot be used because of the history of his bleed  . Renal artery stenosis (HCC)     Mild by history  . Pulmonary hypertension (HCC)     Moderate  . HLD (hyperlipidemia)   . Automatic implantable cardioverter-defibrillator in situ     LV dysfunction and pacer needed for AV node lesion  . CVA (cerebral vascular accident) ([place]) 2009    denies residual on 08/14/2013  . CHF (congestive heart failure) ([place])   . Myocardial infarction ([place]) 2010  . Dysrhythmia   . Pneumonia     "this is my first case" (08/14/2013)  . Spontaneous pneumothorax     right thoracotomy - distant past  . Permanent atrial fibrillation     Originally Coumadin use for atrial fibrillation  //   he had intracerebral hemorrhage with an INR of 2.3 June, 2009. Anticoagulation could no longer  be used.  //  Rapid atrial fibrillation after inferior MI October, 2010..........Marland Kitchen AV node ablation done at that time with ICD pacemaker placed (EF 35%).   //   Left atrial appendage tied off at the time of mitral valve surgery January, 2010 (maze pro  . Status post minimally invasive mitral valve replacement with bioprosthetic valve     33 mm Medtronic Mosaic porcine bioprosthesis placed via right mini thoracotomy for bacterial endocarditis complicated by severe MR and CHF   . Prosthetic valve dysfunction     Mild mitral stenosis  . Chronic combined systolic and diastolic CHF (congestive heart failure) ([place])   . Pacemaker   . Diabetes mellitus without complication ([place])   . Headache(784.0)     related to stroke only  . COPD (chronic obstructive pulmonary disease) (HCC)     O2- 2 liters, nasal cannula, q night   . COPD GOLD II 01/11/2007    PFT's 09/25/13  FEV1  2.18 (61%) ratio 64 no change p B2 and DLC0  32% corrects to 68% - trial off advair and acei rec starting  08/23/2013     . Lung cancer ([place]) 11/29/2013    T1N0 Stage Ia non-small cell carcinoma left lung treated with wedge resection    Social History  Social History  . Marital Status: Married    Spouse Name: [name]  . Number of Children: 0  . Years of Education: College   Occupational History  . Retired     Tour manager   Social History Main Topics  . Smoking status: Former Smoker -- 1.00 packs/day for 45 years    Types: Cigarettes    Quit date: 08/11/2013  . Smokeless tobacco: Never Used     Comment: 08/14/2013 "quit smoking in 2009"  . Alcohol Use: No     Comment: 08/14/2013 "used to drink beer; quit:in 1982"  . Drug Use: No  . Sexual Activity: Yes   Other Topics Concern  . Not on file   Social History Narrative   Patient lives at home with his spouse.   Caffeine Use: none   Worked for the post office   Has two boys and a girl. All live local.     Past Surgical History  Procedure Laterality Date  . Mitral valve  replacement Right 07/17/2008    15m Medtronic Mosaic porcine bioprosthesis  . Cardiac defibrillator placement  ~ 2010    St Jude  . Penile prosthesis implant    . Appendectomy    . Mass biopsy Left     neck mass  . Leep    . Cataract extraction w/ intraocular lens  implant, bilateral Bilateral   . Asd repair, secundum  07/17/2008    pericardial patch closure of ASD  . Av node ablation  07/2008    for rapid atrial fib  . Thoracotomy Right 1970's    spontaneous pneumothorax - while in the [employer] . Tonsillectomy    . Insert / replace / remove pacemaker    . Cardiac valve replacement    . Cardiac catheterization    . Video assisted thoracoscopy (vats)/wedge resection Left 11/29/2013    Procedure: Video assisted thoracoscopy for wedge resection; mini thoracotomy;  Surgeon: CRexene Alberts MD;  [facility]  Service: Thoracic;  Laterality: Left;  . Implantable cardioverter defibrillator (icd) generator change N/A 02/06/2012    Procedure: ICD GENERATOR CHANGE;  Surgeon: GEvans Lance MD;  [facility];  Service: Cardiovascular;  Laterality: N/A;  . Right heart catheterization N/A 08/16/2013    Procedure: RIGHT HEART CATH;  Surgeon: PJosue Hector MD;  [facility];  Service: Cardiovascular;  Laterality: N/A;    Family History  Problem Relation Age of Onset  . Stomach cancer Father   . Stroke Mother     Allergies  Allergen Reactions  . Anticoagulant Compound Other (See Comments)    Pt had intracranial bleed, therefore all anticoagulation is contra-indicated per Dr. KRon Parker  . Warfarin Sodium Other (See Comments)    Pt had intracranial bleed, therefore all anticoagulation is contra-indicated per Dr. KRon Parker     Current Outpatient Prescriptions on File Prior to Visit  Medication Sig Dispense Refill  . aspirin EC 81 MG tablet Take 1 tablet (81 mg total) by mouth daily. 90 tablet 3  . atorvastatin (LIPITOR) 10 MG tablet TAKE 1 TABLET BY MOUTH EVERY DAY 90 tablet 3    . carvedilol (COREG) 3.125 MG tablet TAKE 1 TABLET BY MOUTH TWICE A DAY WITH MEALS 180 tablet 1  . furosemide (LASIX) 40 MG tablet Take 40 mg by mouth daily. May take 1 extra daily as needed for edema    . irbesartan (AVAPRO) 75 MG tablet TAKE 1 TABLET BY MOUTH EVERY DAY 90 tablet 1  .  metFORMIN (GLUCOPHAGE) 1000 MG tablet Take 1 tablet (1,000 mg total) by mouth 2 (two) times daily with a meal. 60 tablet 3  . nitroGLYCERIN (NITROSTAT) 0.4 MG SL tablet Place 0.4 mg under the tongue every 5 (five) minutes as needed for chest pain (x 3 doses). Reported on 12/29/2015    . SYMBICORT 160-4.5 MCG/ACT inhaler INHALE 2 PUFFS INTO THE LUNGS TWICE A DAY 10.2 Inhaler 5   No current facility-administered medications on file prior to visit.    BP 110/62 mmHg  Pulse 76  Temp(Src) 97.6 F (36.4 C) (Oral)  Ht '6\' 2"'$  (1.88 m)  Wt 202 lb (91.627 kg)  BMI 25.92 kg/m2  SpO2 94%       Objective:   Physical Exam  Constitutional: He is oriented to person, place, and time. He appears well-nourished. No distress.  Cardiovascular: Normal rate, regular rhythm, normal heart sounds and intact distal pulses.  Exam reveals no gallop and no friction rub.   No murmur heard. Pulmonary/Chest: Effort normal and breath sounds normal. No respiratory distress. He has no wheezes. He has no rales. He exhibits no tenderness.  2 L via [place]  Neurological: He is alert and oriented to person, place, and time.  Skin: Skin is warm and dry. No rash noted. He is not diaphoretic. No erythema. No pallor.  Psychiatric: He has a normal mood and affect. His behavior is normal. Judgment and thought content normal.  Nursing note and vitals reviewed.     Assessment & Plan:  1. Type 2 diabetes mellitus without complication, without long-term current use of insulin (HCC) - POC HgB A1c- 7.2 - Much improved. Continue to eat healthy and exercise - Follow up in 6 months or sooner if needed  [name], NP

## 2016-01-19 NOTE — Patient Instructions (Signed)
Your A1c is 7.2 today. This has improved from 7.7 three months ago. Please continue to do what you are doing.   Follow up in 6 months or sooner if needed

## 2016-01-22 IMAGING — CT CT CHEST W/O CM
1 of 2 series · 14 of 29 positions shown, 18 images · non-contrast
Comparison: PET-CT 10/29/2013.

CLINICAL DATA: 68-year-old male with history of ischemic
cardiomyopathy and COPD, presenting with history of hypoxia and lung
cancer. Restaging examination.

EXAM:
CT CHEST WITHOUT CONTRAST
TECHNIQUE: Multidetector CT imaging of the chest was performed following the
standard protocol without IV contrast..

[Series 2: rtn chest without st · axial · non-contrast · 0.87mm/px · z∈[-196,+110]mm · 14 of 73 slices shown, 18 images]
[im 6/73  mediastinal]
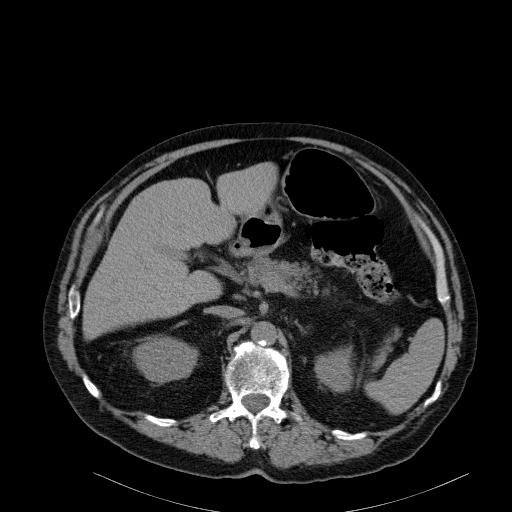
[im 6/73  lung]
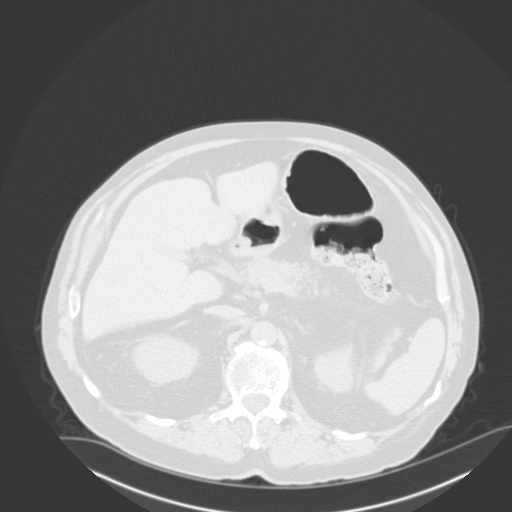
[im 11/73  lung]
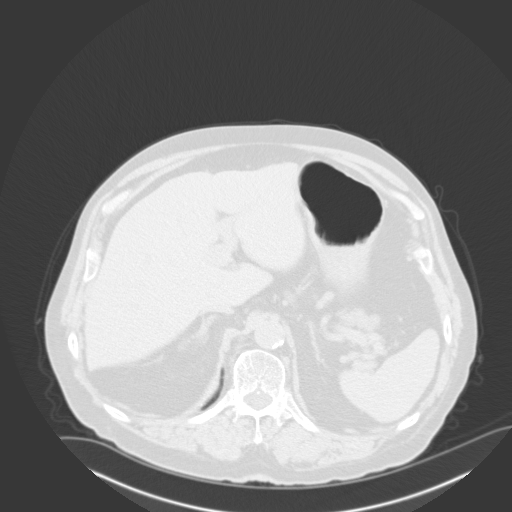
[im 16/73  lung]
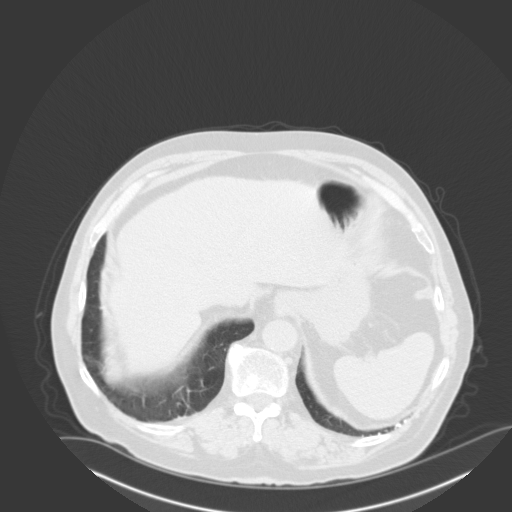
[im 21/73  lung]
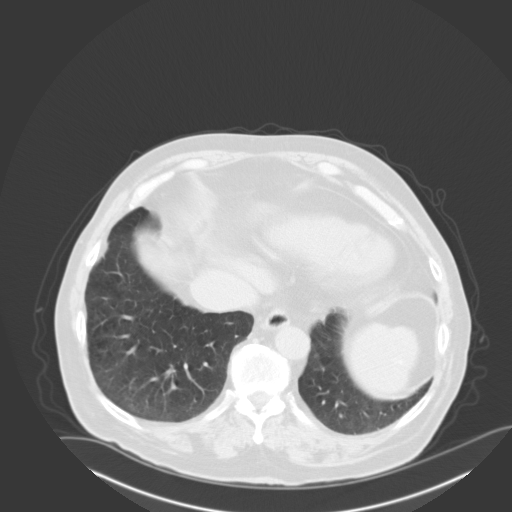
[im 26/73  mediastinal]
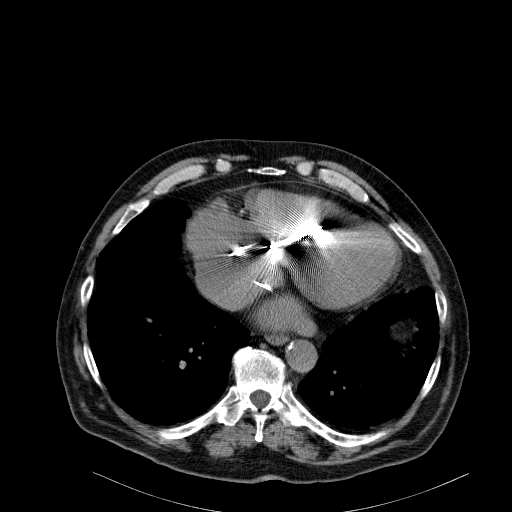
[im 26/73  lung]
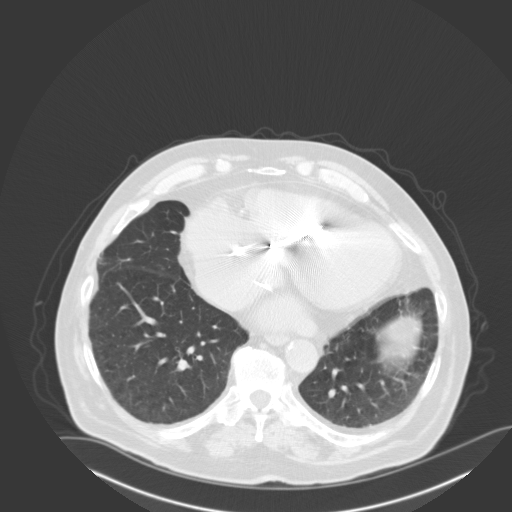
[im 31/73  lung]
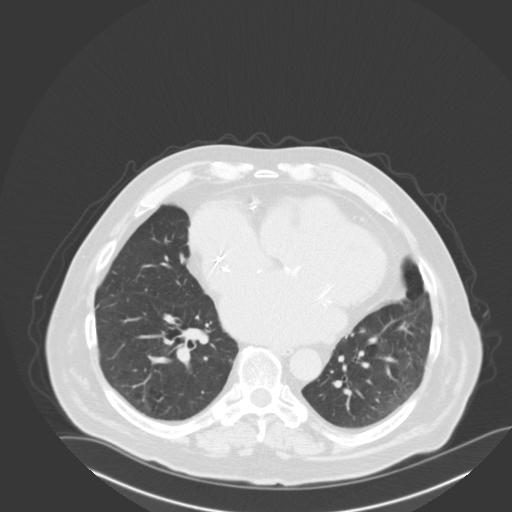
[im 35/73  lung]
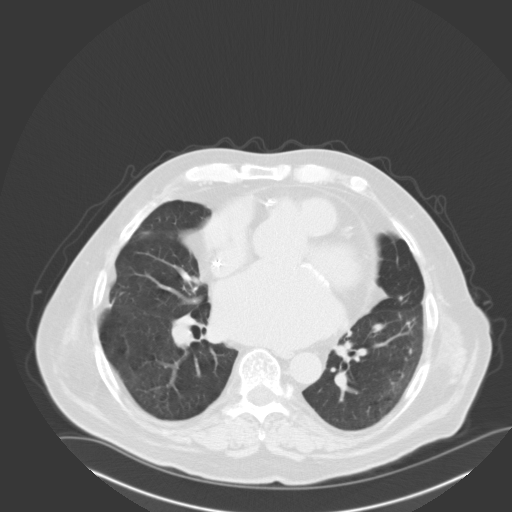
[im 37/73  lung]
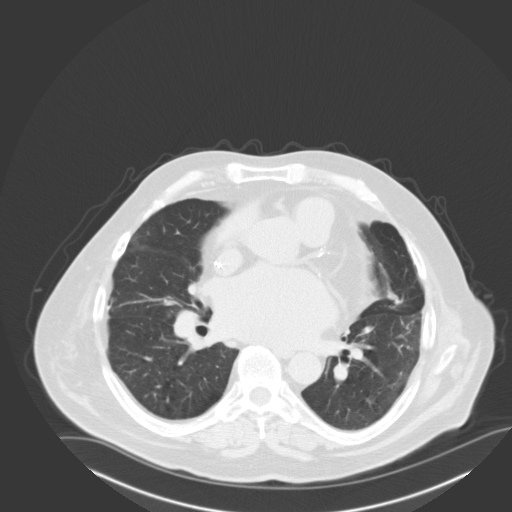
[im 42/73  mediastinal]
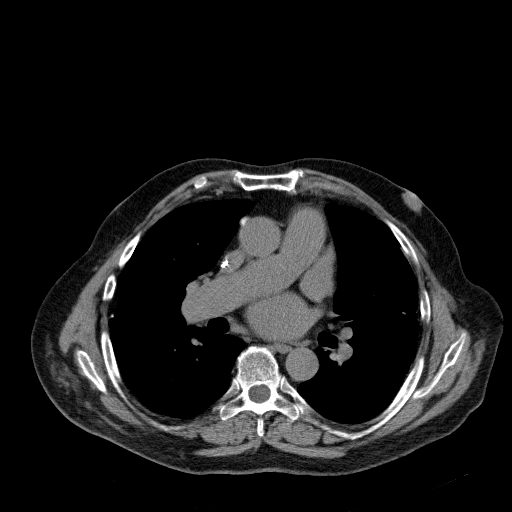
[im 42/73  lung]
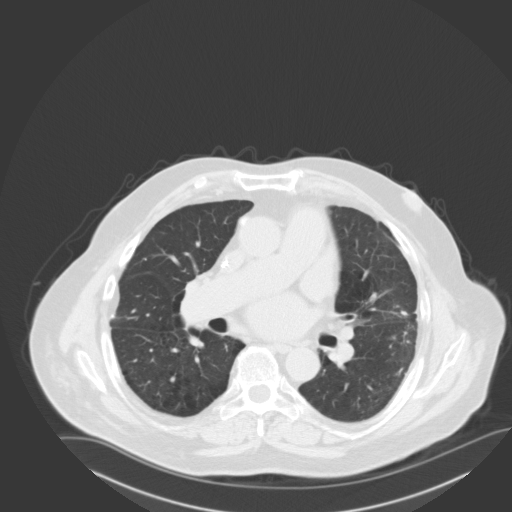
[im 47/73  lung]
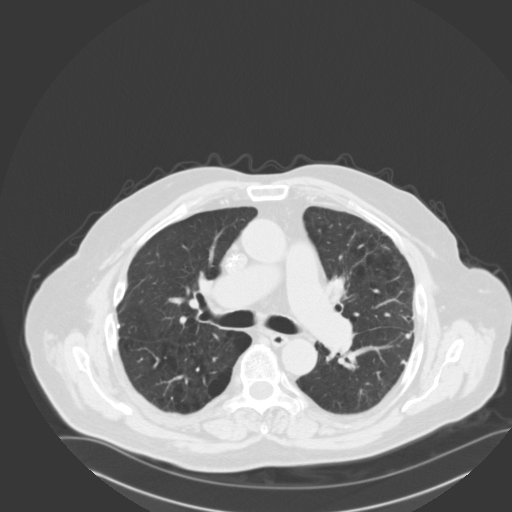
[im 52/73  lung]
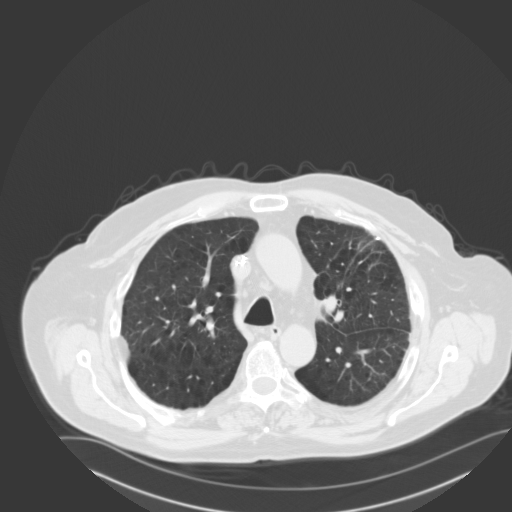
[im 57/73  lung]
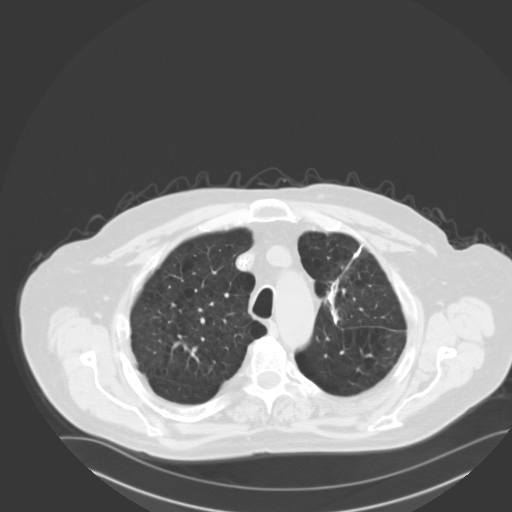
[im 62/73  mediastinal]
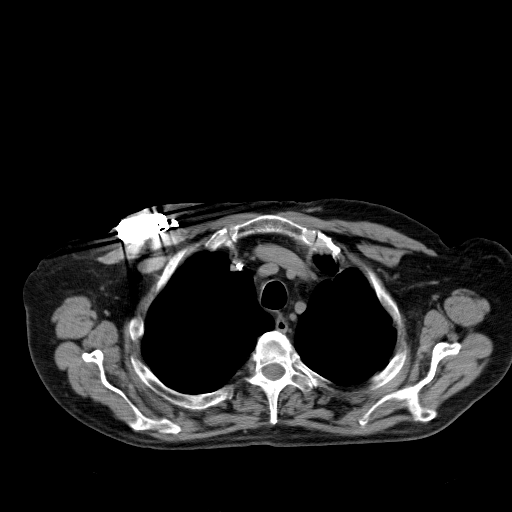
[im 62/73  lung]
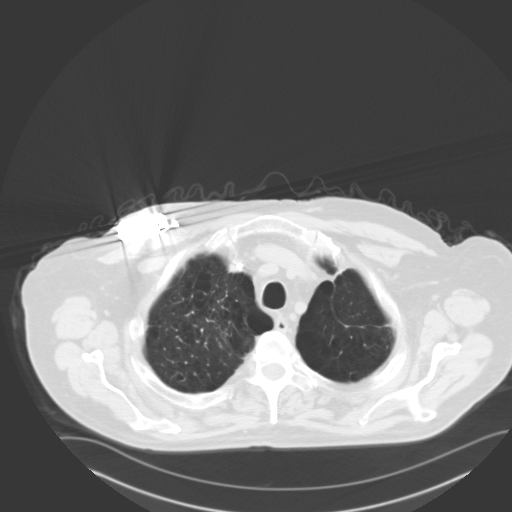
[im 67/73  lung]
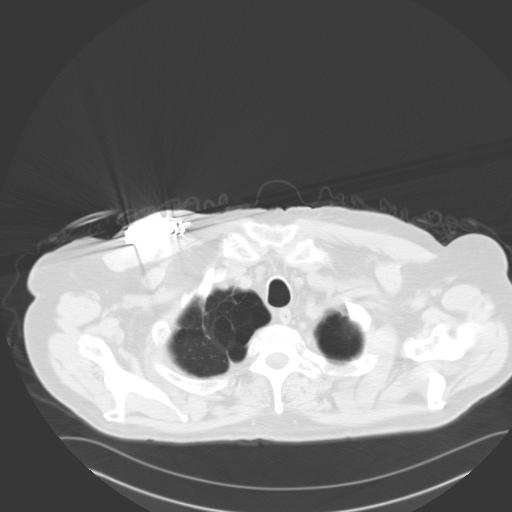

[14 of 29 positions shown; findings below may reference images not displayed]

FINDINGS: Mediastinum: Heart size is moderately enlarged with biatrial
dilatation. There is no significant pericardial fluid, thickening or
pericardial calcification. There is atherosclerosis of the thoracic
aorta, the great vessels of the mediastinum and the coronary
arteries, including calcified atherosclerotic plaque in the left
anterior descending, left circumflex and right coronary arteries.
Status post mitral valve replacement with a bioprosthesis.
Right-sided biventricular pacemaker/AICD in position with lead tips
terminating in the right ventricular apex and overlying the
inferolateral wall of the left ventricle via the coronary sinus and
coronary veins. No pathologically enlarged mediastinal or hilar
lymph nodes. Please note that accurate exclusion of hilar adenopathy
is limited on noncontrast CT scans. Esophagus is unremarkable in
appearance.

Lungs/Pleura: Compared to the prior examination there has been
interval wedge resection in the apex of the left upper lobe. No
definite soft tissue nodularity along the suture line to suggest
local recurrence of disease. No other suspicious appearing pulmonary
nodules or masses are noted in the lungs at this time. Several tiny
calcified pulmonary nodules are seen in the left lower lobe,
compatible with calcified granulomas. Mild to moderate centrilobular
and paraseptal emphysema, most evident the lung apices. Focal area
of architectural distortion in the inferior segment of the lingula
is compatible with chronic scarring and is unchanged. No significant
pleural effusions.

Upper Abdomen: The liver has a shrunken appearance and nodular
contour, with expansion of the preportal fat, and expansion of fat
in the fissure for the ligamentum venosum, suggestive of underlying
cirrhosis.

Musculoskeletal: There are no aggressive appearing lytic or blastic
lesions noted in the visualized portions of the skeleton.
IMPRESSION: 1. Status post wedge resection in the apex of the left upper lobe.
No definite findings to suggest local recurrence of disease or
metastatic disease in the chest on today's examination.
2. Mild diffuse bronchial wall thickening with moderate
centrilobular and paraseptal emphysema; imaging findings compatible
with report history of COPD.
3. Atherosclerosis, including three-vessel coronary artery disease.
Please note that although the presence of coronary artery calcium
documents the presence of coronary artery disease, the severity of
this disease and any potential stenosis cannot be assessed on this
non-gated CT examination. Assessment for potential risk factor
modification, dietary therapy or pharmacologic therapy may be
warranted, if clinically indicated.
4. Cardiomegaly with biatrial dilatation again noted.
5. The appearance of the liver suggests underlying cirrhosis, as
above.

## 2016-02-02 ENCOUNTER — Other Ambulatory Visit: Payer: Self-pay | Admitting: Adult Health

## 2016-02-08 ENCOUNTER — Telehealth: Payer: Self-pay | Admitting: Adult Health

## 2016-02-08 NOTE — Telephone Encounter (Signed)
Pt state that he went to the pharmacy to pick up his metformin and they would not release it to him due to pt telling them the he takes 1000 in the a.m. and p.m.  or 1000 in the a.m and 500 in the p.m.  So [facility] need clarification on the direction. [facility] [id number]

## 2016-02-08 NOTE — Telephone Encounter (Signed)
Called pharmacy to confirm.

## 2016-02-16 ENCOUNTER — Encounter: Payer: Self-pay | Admitting: Internal Medicine

## 2016-03-02 ENCOUNTER — Encounter: Payer: Self-pay | Admitting: Internal Medicine

## 2016-03-02 ENCOUNTER — Ambulatory Visit (INDEPENDENT_AMBULATORY_CARE_PROVIDER_SITE_OTHER): Payer: Medicare Other | Admitting: Internal Medicine

## 2016-03-02 DIAGNOSIS — I428 Other cardiomyopathies: Secondary | ICD-10-CM

## 2016-03-02 DIAGNOSIS — I429 Cardiomyopathy, unspecified: Secondary | ICD-10-CM

## 2016-03-02 LAB — CUP PACEART INCLINIC DEVICE CHECK
Battery Remaining Longevity: 50.4
Brady Statistic RV Percent Paced: 99.59 %
Date Time Interrogation Session: 20170823104012
HighPow Impedance: 74.25 Ohm
Implantable Lead Location: 753858
Lead Channel Pacing Threshold Amplitude: 0.75 V
Lead Channel Pacing Threshold Pulse Width: 0.5 ms
Lead Channel Pacing Threshold Pulse Width: 0.5 ms
Lead Channel Pacing Threshold Pulse Width: 0.5 ms
Lead Channel Setting Sensing Sensitivity: 0.5 mV
MDC IDC LEAD IMPLANT DT: 20100113
MDC IDC LEAD IMPLANT DT: 20100113
MDC IDC LEAD LOCATION: 753860
MDC IDC LEAD MODEL: 7122
MDC IDC MSMT LEADCHNL LV IMPEDANCE VALUE: 900 Ohm
MDC IDC MSMT LEADCHNL LV PACING THRESHOLD AMPLITUDE: 1 V
MDC IDC MSMT LEADCHNL LV PACING THRESHOLD AMPLITUDE: 1 V
MDC IDC MSMT LEADCHNL LV PACING THRESHOLD PULSEWIDTH: 0.5 ms
MDC IDC MSMT LEADCHNL RV IMPEDANCE VALUE: 400 Ohm
MDC IDC MSMT LEADCHNL RV PACING THRESHOLD AMPLITUDE: 0.75 V
MDC IDC PG SERIAL: 7053988
MDC IDC SET LEADCHNL LV PACING AMPLITUDE: 2.125
MDC IDC SET LEADCHNL LV PACING PULSEWIDTH: 0.5 ms
MDC IDC SET LEADCHNL RV PACING AMPLITUDE: 2 V
MDC IDC SET LEADCHNL RV PACING PULSEWIDTH: 0.5 ms
MDC IDC STAT BRADY RA PERCENT PACED: 0 %

## 2016-03-02 NOTE — Progress Notes (Signed)
HPI [name] returns today for followup. He is a very pleasant 70 year old man with multiple medical problems including a non-ischemic cardiomyopathy, chronic systolic heart failure, atrial fibrillation, unable to take Coumadin, status post biventricular ICD implantation secondary to complete heart block. In the interim, he has been stable.  He continues to walk but in a reduced capacity using nasal cannula. He denies chest pain. No peripheral edema. His blood pressure has been low.  He requires home oxygen.  Allergies  Allergen Reactions  . Anticoagulant Compound Other (See Comments)    Pt had intracranial bleed, therefore all anticoagulation is contra-indicated per Dr. Ron Parker.  . Warfarin Sodium Other (See Comments)    Pt had intracranial bleed, therefore all anticoagulation is contra-indicated per Dr. Ron Parker.      Current Outpatient Prescriptions  Medication Sig Dispense Refill  . aspirin EC 81 MG tablet Take 1 tablet (81 mg total) by mouth daily. 90 tablet 3  . atorvastatin (LIPITOR) 10 MG tablet TAKE 1 TABLET BY MOUTH EVERY DAY 90 tablet 3  . carvedilol (COREG) 3.125 MG tablet TAKE 1 TABLET BY MOUTH TWICE A DAY WITH MEALS 180 tablet 1  . furosemide (LASIX) 40 MG tablet Take 40 mg by mouth daily. May take 1 extra daily as needed for edema    . irbesartan (AVAPRO) 75 MG tablet TAKE 1 TABLET BY MOUTH EVERY DAY 90 tablet 1  . metFORMIN (GLUCOPHAGE) 1000 MG tablet Take 1 tablet by mouth in the morning and take 1/2 tablet in the evening    . nitroGLYCERIN (NITROSTAT) 0.4 MG SL tablet Place 0.4 mg under the tongue every 5 (five) minutes as needed for chest pain (x 3 doses). Reported on 12/29/2015    . SYMBICORT 160-4.5 MCG/ACT inhaler INHALE 2 PUFFS INTO THE LUNGS TWICE A DAY 10.2 Inhaler 5   No current facility-administered medications for this visit.      Past Medical History:  Diagnosis Date  . Atrial fibrillation ([place])    AV Node ablation January, 2010, for rapid atrial fib  . Atrial  septal defect    Closed with surgery January, 2010  . Automatic implantable cardioverter-defibrillator in situ    LV dysfunction and pacer needed for AV node lesion  . Cardiomyopathy    non-ischemic  . CHF (congestive heart failure) ([place])   . Chronic combined systolic and diastolic CHF (congestive heart failure) ([place])   . Colon polyps   . COPD (chronic obstructive pulmonary disease) (HCC)    O2- 2 liters, nasal cannula, q night   . COPD GOLD II 01/11/2007   PFT's 09/25/13  FEV1  2.18 (61%) ratio 64 no change p B2 and DLC0  32% corrects to 68% - trial off advair and acei rec starting  08/23/2013     . CVA (cerebral vascular accident) ([place]) 2009   denies residual on 08/14/2013  . Diabetes mellitus without complication ([place])   . Dyslipidemia   . Dysrhythmia   . Ejection fraction < 50%   . Endocarditis    Bacterial, 2009  . Headache(784.0)    related to stroke only  . HLD (hyperlipidemia)   . Hypertension   . Intracranial hemorrhage (HCC)    Coumadin cannot be used because of the history of his bleed  . Lung cancer ([place]) 11/29/2013   T1N0 Stage Ia non-small cell carcinoma left lung treated with wedge resection  . Mitral regurgitation    Severe symptomatic primary MR due to bacterial endocarditis, treated w/ MVR  . Myocardial infarction (  [place]) 2010  . Pacemaker   . Permanent atrial fibrillation    Originally Coumadin use for atrial fibrillation  //   he had intracerebral hemorrhage with an INR of 2.3 June, 2009. Anticoagulation could no longer be used.  //  Rapid atrial fibrillation after inferior MI October, 2010..........Marland Kitchen AV node ablation done at that time with ICD pacemaker placed (EF 35%).   //   Left atrial appendage tied off at the time of mitral valve surgery January, 2010 (maze pro  . Pneumonia    "this is my first case" (08/14/2013)  . Prosthetic valve dysfunction    Mild mitral stenosis  . Pulmonary hypertension (HCC)    Moderate  . Renal artery stenosis (HCC)    Mild by history   . Spontaneous pneumothorax    right thoracotomy - distant past  . Status post minimally invasive mitral valve replacement with bioprosthetic valve    33 mm Medtronic Mosaic porcine bioprosthesis placed via right mini thoracotomy for bacterial endocarditis complicated by severe MR and CHF     ROS:   All systems reviewed and negative except as noted in the HPI.   Past Surgical History:  Procedure Laterality Date  . APPENDECTOMY    . ASD REPAIR, SECUNDUM  07/17/2008   pericardial patch closure of ASD  . AV NODE ABLATION  07/2008   for rapid atrial fib  . CARDIAC CATHETERIZATION    . CARDIAC DEFIBRILLATOR PLACEMENT  ~ [address] Jude  . CARDIAC VALVE REPLACEMENT    . CATARACT EXTRACTION W/ INTRAOCULAR LENS  IMPLANT, BILATERAL Bilateral   . IMPLANTABLE CARDIOVERTER DEFIBRILLATOR (ICD) GENERATOR CHANGE N/A 02/06/2012   Procedure: ICD GENERATOR CHANGE;  Surgeon: [name], MD;  Location: [facility] CATH LAB;  Service: Cardiovascular;  Laterality: N/A;  . INSERT / REPLACE / REMOVE PACEMAKER    . LEEP    . MASS BIOPSY Left    neck mass  . MITRAL VALVE REPLACEMENT Right 07/17/2008   50m Medtronic Mosaic porcine bioprosthesis  . PENILE PROSTHESIS IMPLANT    . RIGHT HEART CATHETERIZATION N/A 08/16/2013   Procedure: RIGHT HEART CATH;  Surgeon: PJosue Hector MD;  [facility];  Service: Cardiovascular;  Laterality: N/A;  . THORACOTOMY Right 1970's   spontaneous pneumothorax - while in the [employer] . TONSILLECTOMY    . VIDEO ASSISTED THORACOSCOPY (VATS)/WEDGE RESECTION Left 11/29/2013   Procedure: Video assisted thoracoscopy for wedge resection; mini thoracotomy;  Surgeon: CRexene Alberts MD;  [facility];  Service: Thoracic;  Laterality: Left;     Family History  Problem Relation Age of Onset  . Stomach cancer Father   . Stroke Mother      Social History   Social History  . Marital status: Married    Spouse name: JGregary Signs . Number of children: 0  . Years of education:  College   Occupational History  . Retired Retired    PTour manager  Social History Main Topics  . Smoking status: Former Smoker    Packs/day: 1.00    Years: 45.00    Types: Cigarettes    Quit date: 08/11/2013  . Smokeless tobacco: Never Used     Comment: 08/14/2013 "quit smoking in 2009"  . Alcohol use No     Comment: 08/14/2013 "used to drink beer; quit:in 1982"  . Drug use: No  . Sexual activity: Yes   Other Topics Concern  . Not on file   Social History Narrative  Patient lives at home with his spouse.   Caffeine Use: none   Worked for the [employer]   Has two boys and a girl. All live local.      BP 108/78   Pulse 71   Ht '6\' 1"'$  (1.854 m)   Wt 206 lb 3.2 oz (93.5 kg)   BMI 27.20 kg/m   Physical Exam:  stable appearing 70 year old man, chronically ill, NAD HEENT: Unremarkable Neck:  7 cm JVD, no thyromegally, wearing nasal cannula Back:  No CVA tenderness Lungs:  Clear with no wheezes, rales, or rhonchi. HEART:  Regular rate rhythm, no murmurs, no rubs, no clicks Abd:  soft, positive bowel sounds, no organomegally, no rebound, no guarding Ext:  2 plus pulses, no edema, no cyanosis, no clubbing Skin:  No rashes no nodules Neuro:  CN II through XII intact, motor grossly intact   DEVICE  Normal device function.  See PaceArt for details.   Assess/Plan: 1. Chronic systolic heart failure - his symptoms are class 2. He is limited mostly by his lungs 2. ICD -his [employer] Jude ICD is working normally. Will recheck in several months 3. COPD - he is stable on oxygen by nasal canula. He will continue his inhalers.  Mikle Bosworth.D.

## 2016-03-02 NOTE — Patient Instructions (Signed)
Medication Instructions:  Your physician recommends that you continue on your current medications as directed. Please refer to the Current Medication list given to you today.   Labwork: None ordered   Testing/Procedures: None ordered   Follow-Up: Remote monitoring is used to monitor your  ICD from home. This monitoring reduces the number of office visits required to check your device to one time per year. It allows [place] to keep an eye on the functioning of your device to ensure it is working properly. You are scheduled for a device check from home on 06/01/16. You may send your transmission at any time that day. If you have a wireless device, the transmission will be sent automatically. After your physician reviews your transmission, you will receive a postcard with your next transmission date.  Your physician wants you to follow-up in: 12 months with Dr Knox Saliva will receive a reminder letter in the mail two months in advance. If you don't receive a letter, please call our office to schedule the follow-up appointment.     Thank you for choosing [facility]!!     Janan Halter, RN [phone]

## 2016-03-08 IMAGING — US US ABDOMEN COMPLETE
1 series · 14 of 25 positions shown · non-contrast
Comparison: PET-CT 10/29/2013 .

CLINICAL DATA: Cirrhosis .

EXAM:
ULTRASOUND ABDOMEN COMPLETE

[Series 1: us abdomen complete · 0.25mm/px · 14 of 80 slices shown]
[im 1/80]
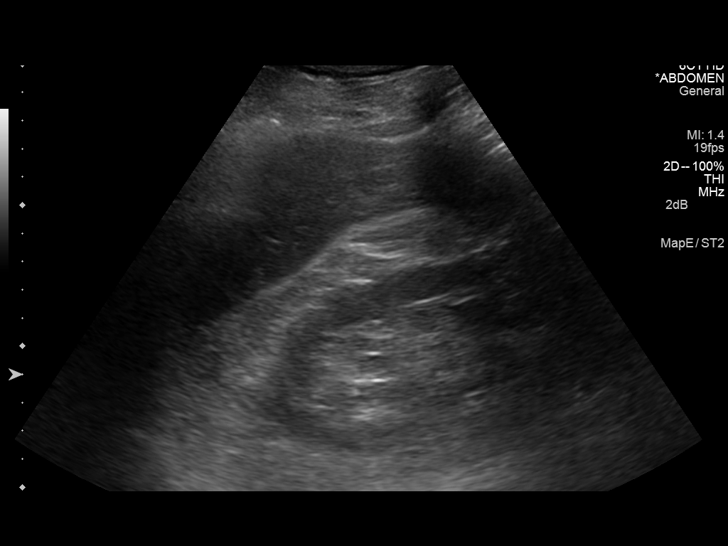
[im 7/80]
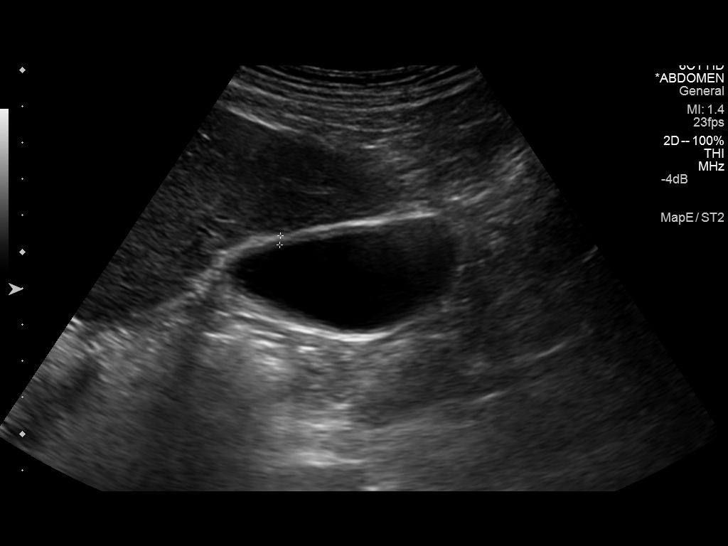
[im 14/80]
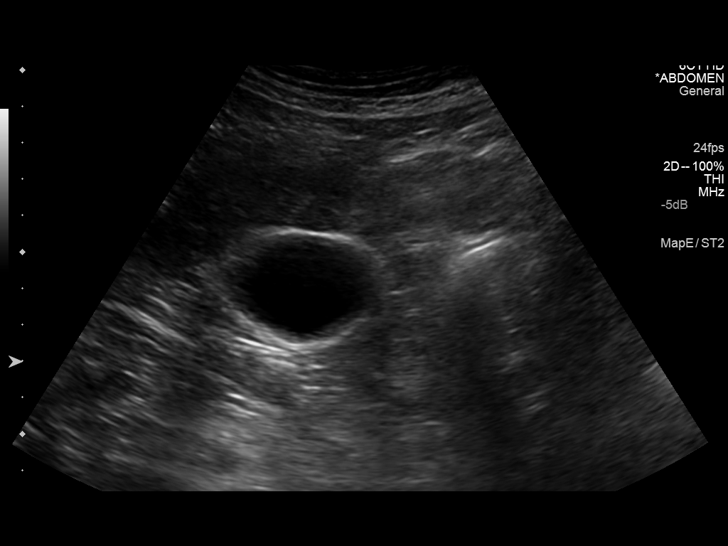
[im 20/80]
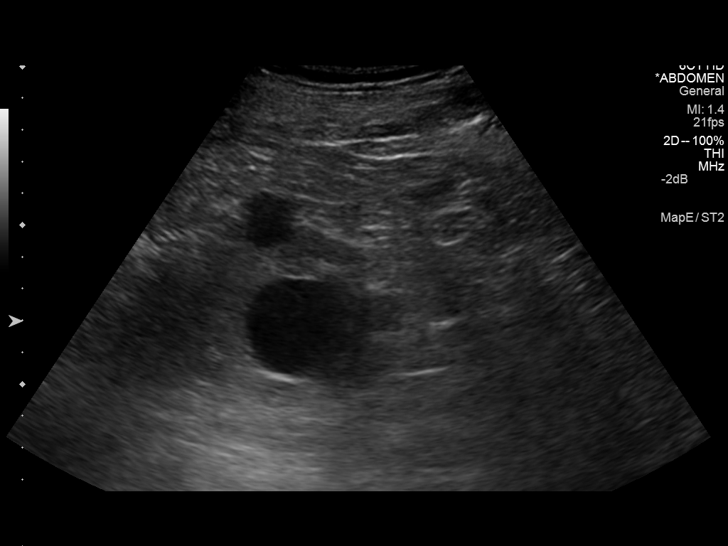
[im 27/80]
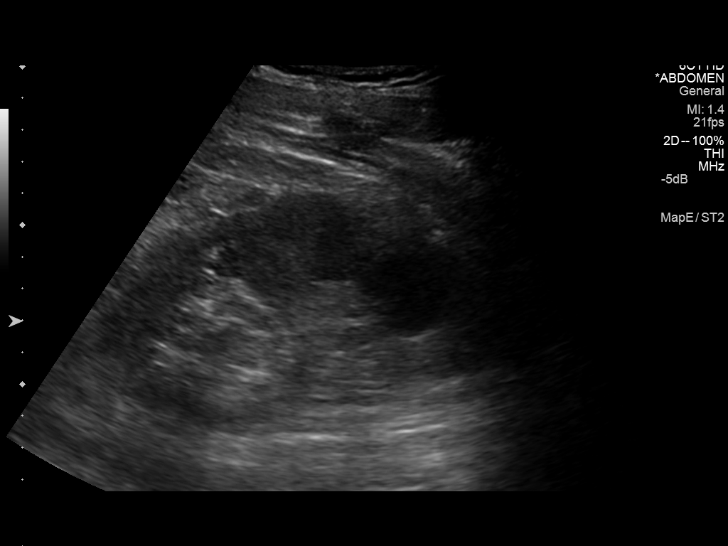
[im 30/80]
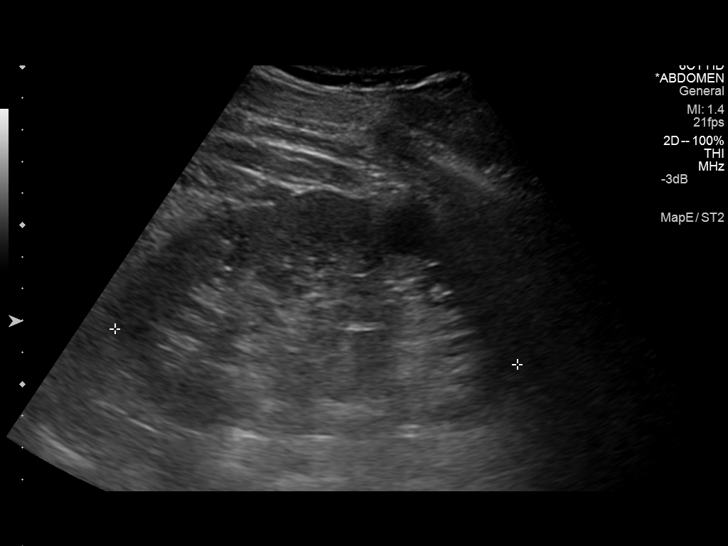
[im 37/80]
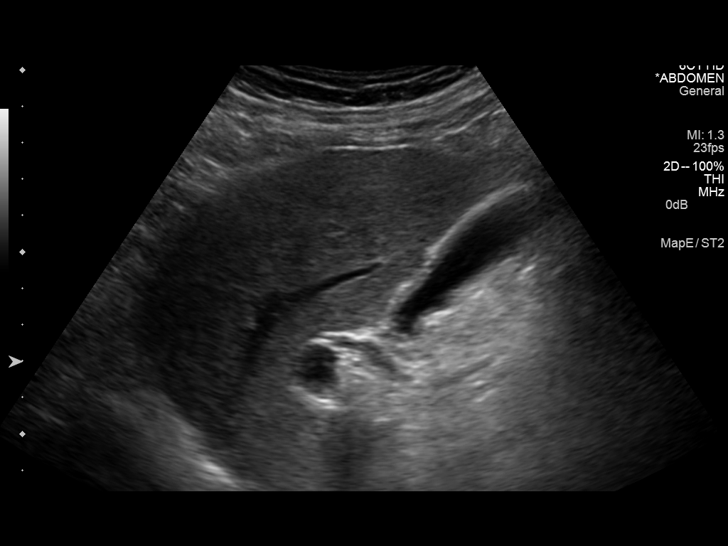
[im 43/80]
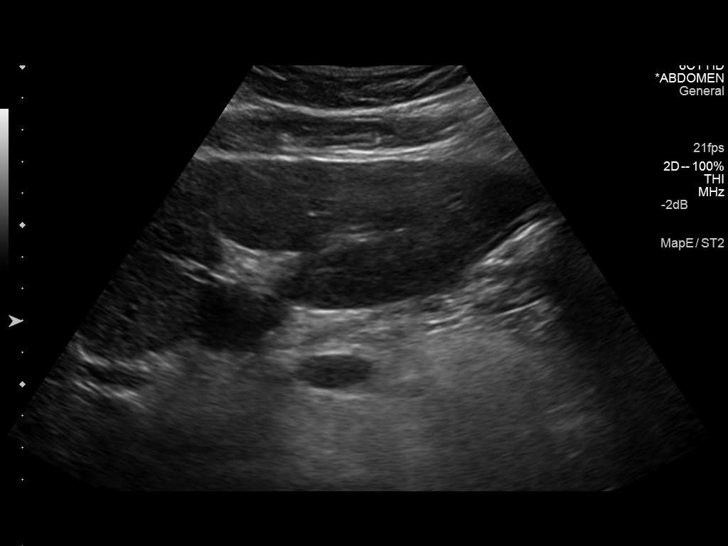
[im 50/80]
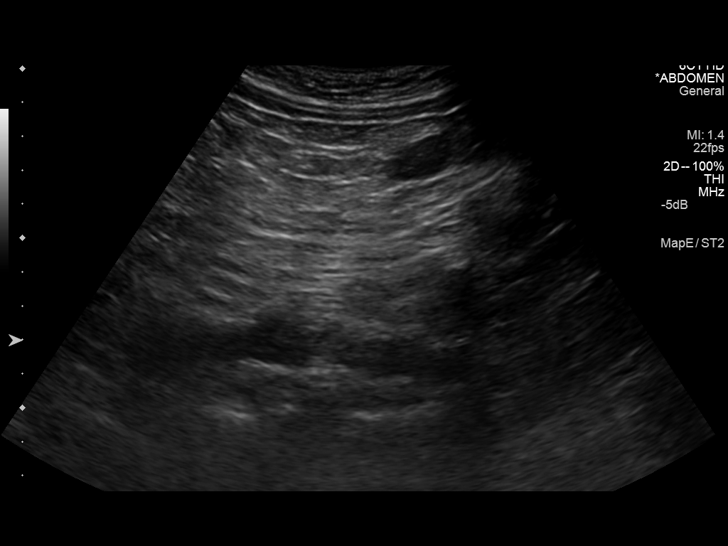
[im 53/80]
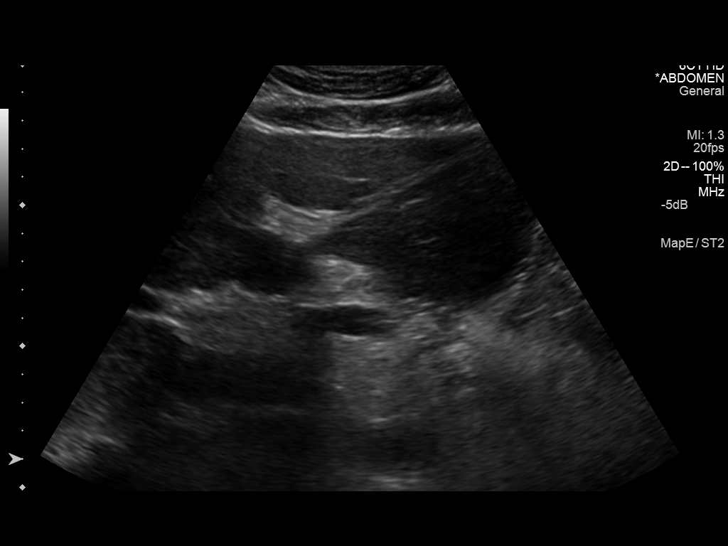
[im 60/80]
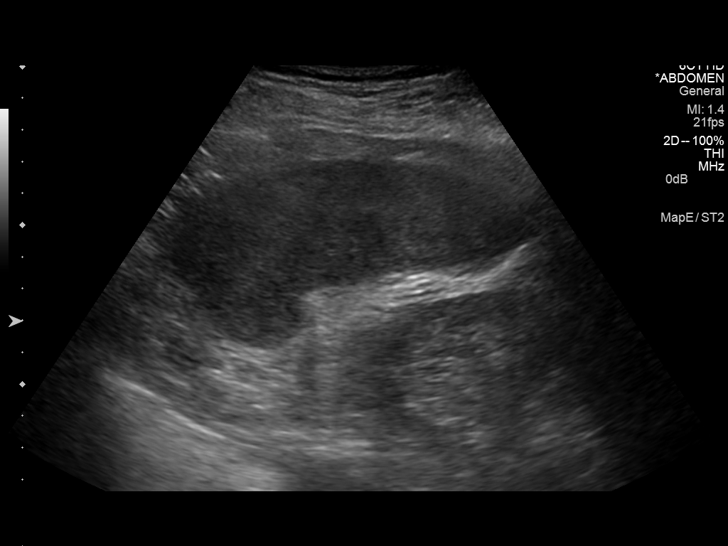
[im 66/80]
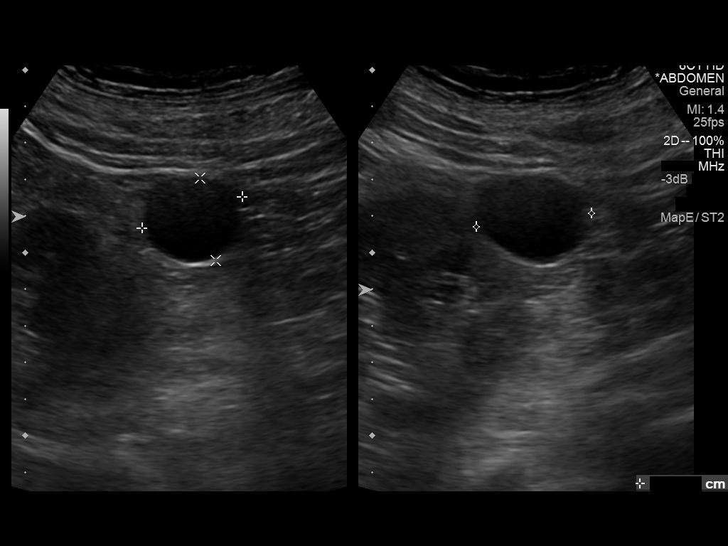
[im 73/80]
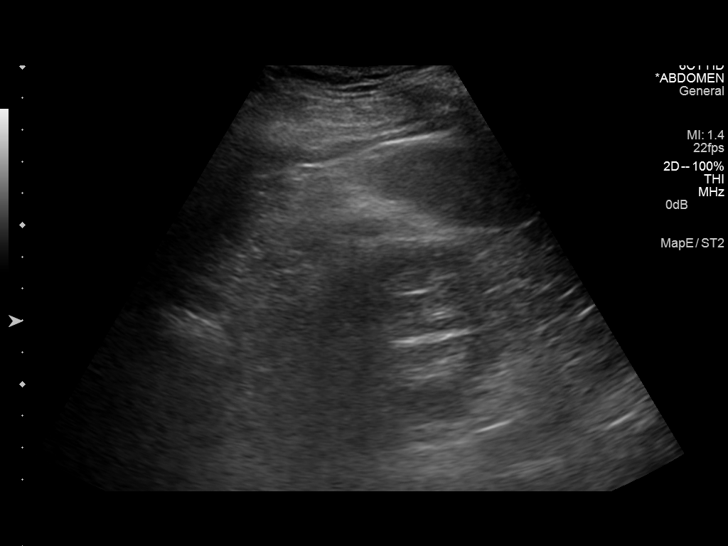
[im 80/80]
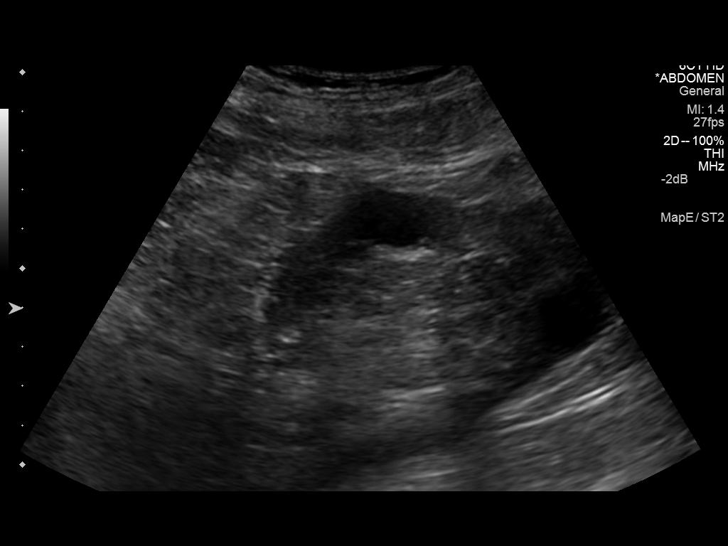

[14 of 25 positions shown; findings below may reference images not displayed]

FINDINGS: Gallbladder: No gallstones or wall thickening visualized. No
sonographic Murphy sign noted.

Common bile duct: Diameter: 4 mm

Liver: Liver is heterogeneous and nodular.  No focal mass.

IVC: No abnormality visualized.

Pancreas: Visualized portion unremarkable.

Spleen: Is prominent at 13.1 cm and 524 cubic cm.

Right Kidney: Length: 13.0 cm. Mild renal cortical thinning .
Echogenicity within normal limits. No hydronephrosis visualized.
Multiple right renal simple cysts.

Left Kidney: Length: 14.4 cm. Mild renal cortical thinning .
Echogenicity within normal limits. No mass or hydronephrosis
visualized. Multiple left renal simple cysts.

Abdominal aorta: No aneurysm visualized.

Other findings: None.
IMPRESSION: 1. Liver has a heterogeneous nodular appearance. Pedis 80 disease
including cirrhosis cannot be excluded. Fatty infiltration cannot be
excluded.

2.  Mild renal cortical thinning.  Simple bilateral renal cysts.

## 2016-03-24 ENCOUNTER — Encounter: Payer: Self-pay | Admitting: Gastroenterology

## 2016-04-17 ENCOUNTER — Other Ambulatory Visit: Payer: Self-pay | Admitting: Adult Health

## 2016-05-05 ENCOUNTER — Other Ambulatory Visit: Payer: Self-pay | Admitting: Adult Health

## 2016-05-05 NOTE — Telephone Encounter (Signed)
Ok to refill for one month  

## 2016-05-05 NOTE — Telephone Encounter (Signed)
Ok to refill 

## 2016-05-12 ENCOUNTER — Other Ambulatory Visit: Payer: Self-pay | Admitting: Adult Health

## 2016-05-12 NOTE — Telephone Encounter (Signed)
Ok to refill for one year  

## 2016-05-17 ENCOUNTER — Other Ambulatory Visit: Payer: Self-pay | Admitting: *Deleted

## 2016-05-17 MED ORDER — METFORMIN HCL 500 MG PO TABS
ORAL_TABLET | ORAL | 1 refills | Status: DC
Start: 2016-05-17 — End: 2016-07-05

## 2016-06-01 ENCOUNTER — Ambulatory Visit (INDEPENDENT_AMBULATORY_CARE_PROVIDER_SITE_OTHER): Payer: Medicare Other | Admitting: *Deleted

## 2016-06-01 DIAGNOSIS — I428 Other cardiomyopathies: Secondary | ICD-10-CM

## 2016-06-01 NOTE — Progress Notes (Signed)
Remote ICD transmission.   

## 2016-06-05 ENCOUNTER — Other Ambulatory Visit: Payer: Self-pay | Admitting: Adult Health

## 2016-06-09 ENCOUNTER — Encounter: Payer: Self-pay | Admitting: Cardiology

## 2016-06-17 ENCOUNTER — Ambulatory Visit (INDEPENDENT_AMBULATORY_CARE_PROVIDER_SITE_OTHER): Payer: Medicare Other | Admitting: Adult Health

## 2016-06-17 ENCOUNTER — Encounter: Payer: Self-pay | Admitting: Adult Health

## 2016-06-17 VITALS — BP 130/60 | HR 77 | Temp 98.0°F | Ht 73.0 in | Wt 203.4 lb

## 2016-06-17 DIAGNOSIS — J449 Chronic obstructive pulmonary disease, unspecified: Secondary | ICD-10-CM | POA: Diagnosis not present

## 2016-06-17 NOTE — Progress Notes (Signed)
   Subjective:    Patient ID: [name], male    DOB: [birth year], 70 y.o.   MRN: [id number]  HPI 70 year old male smoker with ischemic cardiomyopathy for followup of COPD, hypoxia and lung cancer.  He has a 40+ PY hx of smoking , quit 2015 No discernible benefit from Advair (was on for several years) , now on Symbicort   11/2013 left VATS with wedge resection of the left upper lobe - Dr. Roxy Manns.-invasive well-differentiated adenocarcinoma , neg LNs, close margins but no involvement  06/17/2016 Follow up : COPD  Pt returns for 6 month follow up for COPD .   Continues to follow with Dr. Earlie Server for history of non-small lung cancer status post wedge resection of the left upper lobe in May 2015. A CT scan in 12/2015  did not show any recurrence of malignancy  His breathing has remained stable, he had no flare of cough or wheezing.  He is compliant with Symbicort Walks at mall 2-3 weeks .  He remains on O2 at 2l/m walking and At bedtime    PVX , Flu and Prevnar are utd.   Significant tests/ events :  Admitted 08/2013 >> dc'd on O2 Echo 08/2013 -EF 30-35% akinesis of the apex and multiple wall motion abnormalities. RV showed reduced systolic function .  Cath 08/16/13 PCWP 25, Mean RA 11 mmHg  RV 54/5 mmHg  PA: 56/29 mean 39 mmHg  CO: 3.39  He had  pleural disease on right on CT chest & LUL 1.5 cm oblong opacity worrisome for malignancy  PFTs 09/2013 - FEV1 2.18- 61%, ratio 64, FVC 3.40 -71%, DLCO 11.0- 32%  CPET 11/20/13 FEV1 2.28 -58%, MVV 105 (71%), peak V O2 12.7, moderate-to-severe functional limitation due primarily to a circulatory limitation   Review of Systems Patient denies significant dyspnea,cough, hemoptysis,  chest pain, palpitations, pedal edema, orthopnea, paroxysmal nocturnal dyspnea, lightheadedness, nausea, vomiting, abdominal or  leg pains      Objective:   Physical Exam Vitals:   06/17/16 0958  BP: 130/60  Pulse: 77  Temp: 98 F (36.7 C)  TempSrc: Oral  SpO2:  93%  Weight: 203 lb 6.4 oz (92.3 kg)  Height: '6\' 1"'$  (1.854 m)    Gen. Pleasant, well-nourished, in no distress ENT -  no post nasal drip, nasal voice Neck: No JVD, no thyromegaly, no carotid bruits Lungs: no use of accessory muscles, no dullness to percussion, decreased without rales or rhonchi  Cardiovascular: Rhythm regular, heart sounds  normal, no murmurs or gallops, no peripheral edema Musculoskeletal: No deformities, no cyanosis or clubbing   Lateshia Schmoker NP-C  [facility]  06/17/2016

## 2016-06-17 NOTE — Assessment & Plan Note (Signed)
Controlled on Symbicort   Plan  Patient Instructions  Continue on Symbicort 2 puffs Twice daily  , rinse after use.  Order for POC oxygen device evaluation .  Follow up Dr. Elsworth Soho  In  6 months and As needed

## 2016-06-17 NOTE — Assessment & Plan Note (Signed)
Cont w/ o2 with walking and At bedtime

## 2016-06-17 NOTE — Patient Instructions (Signed)
Continue on Symbicort 2 puffs Twice daily  , rinse after use.  Order for POC oxygen device evaluation .  Follow up Dr. Elsworth Soho  In  6 months and As needed

## 2016-06-23 ENCOUNTER — Encounter: Payer: Self-pay | Admitting: Physician Assistant

## 2016-06-23 LAB — CUP PACEART REMOTE DEVICE CHECK
Date Time Interrogation Session: 20171122090025
HIGH POWER IMPEDANCE MEASURED VALUE: 75 Ohm
HighPow Impedance: 75 Ohm
Implantable Lead Implant Date: 20100113
Implantable Lead Implant Date: 20100113
Implantable Lead Location: 753858
Implantable Lead Model: 7122
Implantable Pulse Generator Implant Date: 20130729
Lead Channel Impedance Value: 910 Ohm
Lead Channel Pacing Threshold Amplitude: 0.625 V
Lead Channel Pacing Threshold Amplitude: 1.125 V
Lead Channel Pacing Threshold Pulse Width: 0.5 ms
Lead Channel Pacing Threshold Pulse Width: 0.5 ms
Lead Channel Setting Pacing Amplitude: 2.125
Lead Channel Setting Pacing Pulse Width: 0.5 ms
Lead Channel Setting Sensing Sensitivity: 0.5 mV
MDC IDC LEAD LOCATION: 753860
MDC IDC MSMT BATTERY REMAINING LONGEVITY: 48 mo
MDC IDC MSMT BATTERY REMAINING PERCENTAGE: 53 %
MDC IDC MSMT BATTERY VOLTAGE: 2.93 V
MDC IDC MSMT LEADCHNL RV IMPEDANCE VALUE: 410 Ohm
MDC IDC MSMT LEADCHNL RV SENSING INTR AMPL: 12 mV
MDC IDC PG SERIAL: 7053988
MDC IDC SET LEADCHNL RV PACING AMPLITUDE: 2 V
MDC IDC SET LEADCHNL RV PACING PULSEWIDTH: 0.5 ms

## 2016-06-27 ENCOUNTER — Other Ambulatory Visit: Payer: Self-pay | Admitting: Adult Health

## 2016-06-27 ENCOUNTER — Other Ambulatory Visit: Payer: Self-pay

## 2016-06-27 ENCOUNTER — Telehealth: Payer: Self-pay | Admitting: Pulmonary Disease

## 2016-06-27 MED ORDER — BUDESONIDE-FORMOTEROL FUMARATE 160-4.5 MCG/ACT IN AERO: INHALATION_SPRAY | RESPIRATORY_TRACT | 2 refills | Status: DC

## 2016-06-27 MED ORDER — BUDESONIDE-FORMOTEROL FUMARATE 160-4.5 MCG/ACT IN AERO: INHALATION_SPRAY | RESPIRATORY_TRACT | 5 refills | Status: DC

## 2016-06-27 NOTE — Telephone Encounter (Signed)
Pt needs refill on symbicort [facility]

## 2016-06-27 NOTE — Telephone Encounter (Signed)
Spoke with pt. And they stated that they were contacted from the pharmacy and stated that his refills for his symbicort were denied. The refills were from another office, and I informed the pt. I was unsure as to why they were denied but we could send refills in since according to his pt. instructions TP wanted him to continue on it. Refills were sent in to his pharmacy. Nothing further is needed at this time.   Instructions      Return in about 6 months (around 12/16/2016) for Follow up with Dr. Elsworth Soho.  Continue on Symbicort 2 puffs Twice daily  , rinse after use.  Order for POC oxygen device evaluation .  Follow up Dr. Elsworth Soho  In  6 months and As needed

## 2016-06-27 NOTE — Telephone Encounter (Signed)
Rx sent 

## 2016-07-05 ENCOUNTER — Encounter: Payer: Self-pay | Admitting: Physician Assistant

## 2016-07-05 ENCOUNTER — Ambulatory Visit (INDEPENDENT_AMBULATORY_CARE_PROVIDER_SITE_OTHER): Payer: Medicare Other | Admitting: Physician Assistant

## 2016-07-05 ENCOUNTER — Telehealth: Payer: Self-pay | Admitting: *Deleted

## 2016-07-05 VITALS — BP 118/60 | HR 70 | Ht 74.0 in | Wt 199.0 lb

## 2016-07-05 DIAGNOSIS — Z953 Presence of xenogenic heart valve: Secondary | ICD-10-CM

## 2016-07-05 DIAGNOSIS — I482 Chronic atrial fibrillation: Secondary | ICD-10-CM

## 2016-07-05 DIAGNOSIS — I5022 Chronic systolic (congestive) heart failure: Secondary | ICD-10-CM | POA: Diagnosis not present

## 2016-07-05 DIAGNOSIS — J449 Chronic obstructive pulmonary disease, unspecified: Secondary | ICD-10-CM

## 2016-07-05 DIAGNOSIS — I4821 Permanent atrial fibrillation: Secondary | ICD-10-CM

## 2016-07-05 DIAGNOSIS — E785 Hyperlipidemia, unspecified: Secondary | ICD-10-CM

## 2016-07-05 DIAGNOSIS — Z95810 Presence of automatic (implantable) cardiac defibrillator: Secondary | ICD-10-CM | POA: Diagnosis not present

## 2016-07-05 LAB — BASIC METABOLIC PANEL
BUN: 24 mg/dL (ref 7–25)
CALCIUM: 9.1 mg/dL (ref 8.6–10.3)
CO2: 28 mmol/L (ref 20–31)
Chloride: 101 mmol/L (ref 98–110)
Creat: 1.08 mg/dL (ref 0.70–1.25)
GLUCOSE: 140 mg/dL — AB (ref 65–99)
POTASSIUM: 4.4 mmol/L (ref 3.5–5.3)
SODIUM: 140 mmol/L (ref 135–146)

## 2016-07-05 NOTE — Telephone Encounter (Signed)
DPR on file for pt's wife who has been notified of lab results by phone with verbal understanding. Advised per Brynda Rim. PA to have pt f/u with PCP due to elevated glucose. Pt's wife agreeable to plan of care.

## 2016-07-05 NOTE — Patient Instructions (Signed)
Medication Instructions:  No changes.  See your medication list.  Labwork: Today - BMET   Testing/Procedures: 1. Schedule a follow up echocardiogram (Dx: s/p Mitral Valve Replacement in 0223; Chronic Systolic congestive heart failure) Your physician has requested that you have an echocardiogram. Echocardiography is a painless test that uses sound waves to create images of your heart. It provides your doctor with information about the size and shape of your heart and how well your heart's chambers and valves are working. This procedure takes approximately one hour. There are no restrictions for this procedure.   Follow-Up: Dr. Candee Furbish in 6 months.  Any Other Special Instructions Will Be Listed Below (If Applicable).  If you need a refill on your cardiac medications before your next appointment, please call your pharmacy.

## 2016-07-05 NOTE — Progress Notes (Signed)
Cardiology Office Note:    Date:  07/05/2016   ID:  [name], DOB 10-Nov-1945, MRN [id number]  PCP:  [name], NP  Cardiologist:  Dr. Cleatis Polka (retired) >> Dr. Candee Furbish   Electrophysiologist:  Dr. Cristopher Peru  Pulmonologist: Dr. Elsworth Soho Oncologist: Dr. Julien Nordmann  Referring MD: [name], NP   Chief Complaint  Patient presents with  . Follow-up    CHF, AFib    History of Present Illness:    [name] is a 70 y.o. male with a hx of mitral regurgitation 2/2 endocarditis s/p bioprosthetic MVR, permanent AF, intracerebral hemorrhage in 2009 (anticoagulation stopped), systolic CHF, NICM with EF 35%, inferior MI in 2009, prior CVA, COPD on chronic O2, Lung CA s/p wedge resection, pulmonary HTN.  He underwent AV node ablation due uncontrolled atrial fibrillation in 2010 with placement of BiV-ICD.  He also underwent LA appendage clipping at the time of his MV surgery.  LHC prior to MVR demonstrated normal coronaries (etiology of MI in 2009 unknown).    Admitted in 3/17 for near syncope related to orthostatic hypotension.    Last seen by Dr. Candee Furbish in 6/17.  He returns for Cardiology follow up.  He is here alone.  He is overall doing well. He denies chest pain.  He denies significant changes in his shortness of breath.  He uses continuous O2.  He denies syncope or ICD therapies.  He denies orthopnea, PND, edema.  He denies any bleeding issues.   Prior CV studies that were reviewed today include:    Echo 08/13/13 Apical, apical ant, septal, inf akinesis, inf and inf-lat HK, mild conc LVH, EF 30-35, mildly dilated Ao root (44 mm), MVR ok with mean 10 mmHg, severe LAE, mild RVE, mod reduced RVSF, mod RAE  LHC 12/09 LAD distal 30 R Renal Art  30-40  Past Medical History:  Diagnosis Date  . Atrial fibrillation ([place])    AV Node ablation January, 2010, for rapid atrial fib  . Atrial septal defect    Closed with surgery January, 2010  . Automatic implantable  cardioverter-defibrillator in situ    LV dysfunction and pacer needed for AV node lesion  . Cardiomyopathy    non-ischemic  . CHF (congestive heart failure) ([place])   . Chronic combined systolic and diastolic CHF (congestive heart failure) ([place])   . Colon polyps   . COPD (chronic obstructive pulmonary disease) (HCC)    O2- 2 liters, nasal cannula, q night   . COPD GOLD II 01/11/2007   PFT's 09/25/13  FEV1  2.18 (61%) ratio 64 no change p B2 and DLC0  32% corrects to 68% - trial off advair and acei rec starting  08/23/2013     . CVA (cerebral vascular accident) ([place]) 2009   denies residual on 08/14/2013  . Diabetes mellitus without complication ([place])   . Dyslipidemia   . Dysrhythmia   . Ejection fraction < 50%   . Endocarditis    Bacterial, 2009  . Headache(784.0)    related to stroke only  . HLD (hyperlipidemia)   . Hypertension   . Intracranial hemorrhage (HCC)    Coumadin cannot be used because of the history of his bleed  . Lung cancer ([place]) 11/29/2013   T1N0 Stage Ia non-small cell carcinoma left lung treated with wedge resection  . Mitral regurgitation    Severe symptomatic primary MR due to bacterial endocarditis, treated w/ MVR  . Myocardial infarction 2010  . Pacemaker   .  Permanent atrial fibrillation    Originally Coumadin use for atrial fibrillation  //   he had intracerebral hemorrhage with an INR of 2.3 June, 2009. Anticoagulation could no longer be used.  //  Rapid atrial fibrillation after inferior MI October, 2010..........Marland Kitchen AV node ablation done at that time with ICD pacemaker placed (EF 35%).   //   Left atrial appendage tied off at the time of mitral valve surgery January, 2010 (maze pro  . Pneumonia    "this is my first case" (08/14/2013)  . Prosthetic valve dysfunction    Mild mitral stenosis  . Pulmonary hypertension    Moderate  . Renal artery stenosis (HCC)    Mild by history  . Spontaneous pneumothorax    right thoracotomy - distant past  . Status post  minimally invasive mitral valve replacement with bioprosthetic valve    33 mm Medtronic Mosaic porcine bioprosthesis placed via right mini thoracotomy for bacterial endocarditis complicated by severe MR and CHF     Past Surgical History:  Procedure Laterality Date  . APPENDECTOMY    . ASD REPAIR, SECUNDUM  07/17/2008   pericardial patch closure of ASD  . AV NODE ABLATION  07/2008   for rapid atrial fib  . CARDIAC CATHETERIZATION    . CARDIAC DEFIBRILLATOR PLACEMENT  ~ [address] Jude  . CARDIAC VALVE REPLACEMENT    . CATARACT EXTRACTION W/ INTRAOCULAR LENS  IMPLANT, BILATERAL Bilateral   . IMPLANTABLE CARDIOVERTER DEFIBRILLATOR (ICD) GENERATOR CHANGE N/A 02/06/2012   Procedure: ICD GENERATOR CHANGE;  Surgeon: [name], MD;  [facility] LAB;  Service: Cardiovascular;  Laterality: N/A;  . INSERT / REPLACE / REMOVE PACEMAKER    . LEEP    . MASS BIOPSY Left    neck mass  . MITRAL VALVE REPLACEMENT Right 07/17/2008   59m Medtronic Mosaic porcine bioprosthesis  . PENILE PROSTHESIS IMPLANT    . RIGHT HEART CATHETERIZATION N/A 08/16/2013   Procedure: RIGHT HEART CATH;  Surgeon: PJosue Hector MD;  [facility];  Service: Cardiovascular;  Laterality: N/A;  . THORACOTOMY Right 1970's   spontaneous pneumothorax - while in the [employer] . TONSILLECTOMY    . VIDEO ASSISTED THORACOSCOPY (VATS)/WEDGE RESECTION Left 11/29/2013   Procedure: Video assisted thoracoscopy for wedge resection; mini thoracotomy;  Surgeon: CRexene Alberts MD;  Location: [facility] OR;  Service: Thoracic;  Laterality: Left;    Current Medications: Current Meds  Medication Sig  . aspirin EC 81 MG tablet Take 1 tablet (81 mg total) by mouth daily.  .Marland Kitchenatorvastatin (LIPITOR) 10 MG tablet TAKE 1 TABLET BY MOUTH EVERY DAY  . budesonide-formoterol (SYMBICORT) 160-4.5 MCG/ACT inhaler INHALE 2 PUFFS INTO THE LUNGS TWICE A DAY  . carvedilol (COREG) 3.125 MG tablet TAKE 1 TABLET BY MOUTH TWICE A DAY WITH MEALS  .  furosemide (LASIX) 40 MG tablet Take 40 mg by mouth daily. May take 1 extra daily as needed for edema  . irbesartan (AVAPRO) 75 MG tablet TAKE 1 TABLET BY MOUTH EVERY DAY  . metFORMIN (GLUCOPHAGE) 500 MG tablet TAKE 1 (ONE) TABLET BY MOUTH IN THE MORNING WITH BREAKFAST AND 1/2 TABLET BY MOUTH WITH EVENING MEAL  . nitroGLYCERIN (NITROSTAT) 0.4 MG SL tablet Place 0.4 mg under the tongue every 5 (five) minutes as needed for chest pain (x 3 doses). Reported on 12/29/2015     Allergies:   Anticoagulant compound and Warfarin sodium   Social History   Social History  .  Marital status: Married    Spouse name: [name]  . Number of children: 0  . Years of education: College   Occupational History  . Retired Retired    Tour manager   Social History Main Topics  . Smoking status: Former Smoker    Packs/day: 1.00    Years: 45.00    Types: Cigarettes    Quit date: 08/11/2013  . Smokeless tobacco: Never Used     Comment: 08/14/2013 "quit smoking in 2009"  . Alcohol use No     Comment: 08/14/2013 "used to drink beer; quit:in 1982"  . Drug use: No  . Sexual activity: Yes   Other Topics Concern  . None   Social History Narrative   Patient lives at home with his spouse.   Caffeine Use: none   Worked for the post office   Has two boys and a girl. All live local.      Family History:  The patient's family history includes Stomach cancer in his father; Stroke in his mother.   ROS:   Please see the history of present illness.    Review of Systems  HENT: Positive for hearing loss.   Eyes: Positive for visual disturbance.  Hematologic/Lymphatic: Bruises/bleeds easily.   All other systems reviewed and are negative.   EKGs/Labs/Other Test Reviewed:    EKG:  EKG is  ordered today.  The ekg ordered today demonstrates Probable underlying A. fib/flutter, ventricular pacing, HR 70  Recent Labs: 09/24/2015: B Natriuretic Peptide 285.0; TSH 0.592 12/25/2015: ALT 10; BUN 22.2; Creatinine 1.0; HGB  15.0; Platelets 136; Potassium 4.4; Sodium 139   Recent Lipid Panel    Component Value Date/Time   CHOL 107 10/19/2015 1050   TRIG 164.0 (H) 10/19/2015 1050   HDL 35.40 (L) 10/19/2015 1050   CHOLHDL 3 10/19/2015 1050   VLDL 32.8 10/19/2015 1050   LDLCALC 39 10/19/2015 1050   LDLDIRECT 80.2 03/31/2011 1411     Physical Exam:    VS:  BP 118/60   Pulse 70   Ht '6\' 2"'$  (1.88 m)   Wt 199 lb (90.3 kg)   BMI 25.55 kg/m     Wt Readings from Last 3 Encounters:  07/05/16 199 lb (90.3 kg)  06/17/16 203 lb 6.4 oz (92.3 kg)  03/02/16 206 lb 3.2 oz (93.5 kg)     Physical Exam  Constitutional: He is oriented to person, place, and time. He appears well-developed and well-nourished. No distress.  HENT:  Head: Normocephalic and atraumatic.  Eyes: No scleral icterus.  Neck: No JVD present.  Cardiovascular: Normal rate and regular rhythm.   No murmur heard. Pulmonary/Chest: He has no wheezes. He has no rales.  Abdominal: There is no tenderness.  Musculoskeletal: He exhibits no edema.  Neurological: He is alert and oriented to person, place, and time.  Skin: Skin is warm and dry.  Psychiatric: He has a normal mood and affect.    ASSESSMENT:    1. Chronic systolic CHF (congestive heart failure) (HCC)   2. Status post minimally invasive mitral valve replacement with bioprosthetic valve   3. Permanent atrial fibrillation ([place])   4. ICD (implantable cardioverter-defibrillator), biventricular, in situ   5. Dyslipidemia   6. COPD GOLD II    PLAN:    In order of problems listed above:  1. Chronic systolic CHF - 2/2 NICM. He had no significant CAD at [facility] In [facility] prior to his MVR.  Volume appears stable.  Continue beta-blocker, angiotensin receptor blocker, Lasix.  Check FU BMET.  Obtain follow up echocardiogram.  2. S/p MVR - Overall stable by exam.  Last echo was in 2015.  He had mild MS at that time.  Will arrange follow up Echo.  Continues SBE prophylaxis.     3. Permanent AF - s/p  AV node ablation and BiV-ICD.  Not a candidate for anticoagulation due to prior hx of intracranial hemorrhage on Coumadin.    4. S/p BiV-ICD - Continue FU with EP as planned.   5. HL -  LDL in 4/17 was 39.  Continue statin.  6. COPD - On O2.  Continue FU with Pulmonology as planned.    Medication Adjustments/Labs and Tests Ordered: Current medicines are reviewed at length with the patient today.  Concerns regarding medicines are outlined above.  Medication changes, Labs and Tests ordered today are outlined in the Patient Instructions noted below. Patient Instructions  Medication Instructions:  No changes.  See your medication list.  Labwork: Today - BMET   Testing/Procedures: 1. Schedule a follow up echocardiogram (Dx: s/p Mitral Valve Replacement in 9323; Chronic Systolic congestive heart failure) Your physician has requested that you have an echocardiogram. Echocardiography is a painless test that uses sound waves to create images of your heart. It provides your doctor with information about the size and shape of your heart and how well your heart's chambers and valves are working. This procedure takes approximately one hour. There are no restrictions for this procedure.   Follow-Up: Dr. Candee Furbish in 6 months.  Any Other Special Instructions Will Be Listed Below (If Applicable).  If you need a refill on your cardiac medications before your next appointment, please call your pharmacy.   Signed, [name], PA-C  07/05/2016 11:28 AM    [facility] [place], [place], [place]  [address] Phone: [phone]; Fax: [phone]

## 2016-07-19 ENCOUNTER — Other Ambulatory Visit: Payer: Self-pay

## 2016-07-19 DIAGNOSIS — K7460 Unspecified cirrhosis of liver: Secondary | ICD-10-CM

## 2016-07-19 DIAGNOSIS — R188 Other ascites: Principal | ICD-10-CM

## 2016-07-21 ENCOUNTER — Ambulatory Visit (INDEPENDENT_AMBULATORY_CARE_PROVIDER_SITE_OTHER): Payer: Medicare Other | Admitting: Adult Health

## 2016-07-21 VITALS — BP 124/68 | Temp 97.3°F | Ht 74.0 in | Wt 199.0 lb

## 2016-07-21 DIAGNOSIS — E119 Type 2 diabetes mellitus without complications: Secondary | ICD-10-CM | POA: Diagnosis not present

## 2016-07-21 LAB — POCT GLYCOSYLATED HEMOGLOBIN (HGB A1C): HEMOGLOBIN A1C: 7.7

## 2016-07-21 NOTE — Progress Notes (Signed)
Subjective:    Patient ID: [name], male    DOB: 05-Nov-1945, 71 y.o.   MRN: [id number]  HPI  71 year old male who  has a past medical history of Atrial fibrillation ([place]); Atrial septal defect; Automatic implantable cardioverter-defibrillator in situ; Cardiomyopathy; CHF (congestive heart failure) ([place]); Chronic combined systolic and diastolic CHF (congestive heart failure) ([place]); Colon polyps; COPD (chronic obstructive pulmonary disease) ([place]); COPD GOLD II (01/11/2007); CVA (cerebral vascular accident) ([place]) (2009); Diabetes mellitus without complication ([place]); Dyslipidemia; Dysrhythmia; Ejection fraction < 50%; Endocarditis; Headache(784.0); HLD (hyperlipidemia); Hypertension; Intracranial hemorrhage ([place]); Lung cancer ([place]) (11/29/2013); Mitral regurgitation; Myocardial infarction (2010); Pacemaker; Permanent atrial fibrillation; Pneumonia; Prosthetic valve dysfunction; Pulmonary hypertension; Renal artery stenosis ([place]); Spontaneous pneumothorax; and Status post minimally invasive mitral valve replacement with bioprosthetic valve.   He presents to the office today for 6 month follow up regarding diabetes. His last A1c was 7.2. He does not check his blood sugars on a regular basis. He reports that the holidays were hard for him and trying to eat healthy.   He takes Metformin '1000mg'$  in the morning and 500 mg in the evening.   He does not have any acute issues and has no complaints, states " I am feeling really good."     Review of Systems  Constitutional: Negative.   Respiratory: Negative.   Cardiovascular: Negative.   Endocrine: Negative.   Neurological: Negative.   All other systems reviewed and are negative.  Past Medical History:  Diagnosis Date  . Atrial fibrillation ([place])    AV Node ablation January, 2010, for rapid atrial fib  . Atrial septal defect    Closed with surgery January, 2010  . Automatic implantable cardioverter-defibrillator in situ    LV dysfunction and  pacer needed for AV node lesion  . Cardiomyopathy    non-ischemic  . CHF (congestive heart failure) ([place])   . Chronic combined systolic and diastolic CHF (congestive heart failure) ([place])   . Colon polyps   . COPD (chronic obstructive pulmonary disease) (HCC)    O2- 2 liters, nasal cannula, q night   . COPD GOLD II 01/11/2007   PFT's 09/25/13  FEV1  2.18 (61%) ratio 64 no change p B2 and DLC0  32% corrects to 68% - trial off advair and acei rec starting  08/23/2013     . CVA (cerebral vascular accident) ([place]) 2009   denies residual on 08/14/2013  . Diabetes mellitus without complication ([place])   . Dyslipidemia   . Dysrhythmia   . Ejection fraction < 50%   . Endocarditis    Bacterial, 2009  . Headache(784.0)    related to stroke only  . HLD (hyperlipidemia)   . Hypertension   . Intracranial hemorrhage (HCC)    Coumadin cannot be used because of the history of his bleed  . Lung cancer ([name]) 11/29/2013   T1N0 Stage Ia non-small cell carcinoma left lung treated with wedge resection  . Mitral regurgitation    Severe symptomatic primary MR due to bacterial endocarditis, treated w/ MVR  . Myocardial infarction 2010  . Pacemaker   . Permanent atrial fibrillation    Originally Coumadin use for atrial fibrillation  //   he had intracerebral hemorrhage with an INR of 2.3 June, 2009. Anticoagulation could no longer be used.  //  Rapid atrial fibrillation after inferior MI October, 2010..........Marland Kitchen AV node ablation done at that time with ICD pacemaker placed (EF 35%).   //   Left atrial appendage tied  off at the time of mitral valve surgery January, 2010 (maze pro  . Pneumonia    "this is my first case" (08/14/2013)  . Prosthetic valve dysfunction    Mild mitral stenosis  . Pulmonary hypertension    Moderate  . Renal artery stenosis (HCC)    Mild by history  . Spontaneous pneumothorax    right thoracotomy - distant past  . Status post minimally invasive mitral valve replacement with bioprosthetic  valve    33 mm Medtronic Mosaic porcine bioprosthesis placed via right mini thoracotomy for bacterial endocarditis complicated by severe MR and CHF     Social History   Social History  . Marital status: Married    Spouse name: [name]  . Number of children: 0  . Years of education: College   Occupational History  . Retired Retired    Tour manager   Social History Main Topics  . Smoking status: Former Smoker    Packs/day: 1.00    Years: 45.00    Types: Cigarettes    Quit date: 08/11/2013  . Smokeless tobacco: Never Used     Comment: 08/14/2013 "quit smoking in 2009"  . Alcohol use No     Comment: 08/14/2013 "used to drink beer; quit:in 1982"  . Drug use: No  . Sexual activity: Yes   Other Topics Concern  . Not on file   Social History Narrative   Patient lives at home with his spouse.   Caffeine Use: none   Worked for the post office   Has two boys and a girl. All live local.     Past Surgical History:  Procedure Laterality Date  . APPENDECTOMY    . ASD REPAIR, SECUNDUM  07/17/2008   pericardial patch closure of ASD  . AV NODE ABLATION  07/2008   for rapid atrial fib  . CARDIAC CATHETERIZATION    . CARDIAC DEFIBRILLATOR PLACEMENT  ~ [address] Jude  . CARDIAC VALVE REPLACEMENT    . CATARACT EXTRACTION W/ INTRAOCULAR LENS  IMPLANT, BILATERAL Bilateral   . IMPLANTABLE CARDIOVERTER DEFIBRILLATOR (ICD) GENERATOR CHANGE N/A 02/06/2012   Procedure: ICD GENERATOR CHANGE;  Surgeon: [name], MD;  Location: [facility] CATH LAB;  Service: Cardiovascular;  Laterality: N/A;  . INSERT / REPLACE / REMOVE PACEMAKER    . LEEP    . MASS BIOPSY Left    neck mass  . MITRAL VALVE REPLACEMENT Right 07/17/2008   40m Medtronic Mosaic porcine bioprosthesis  . PENILE PROSTHESIS IMPLANT    . RIGHT HEART CATHETERIZATION N/A 08/16/2013   Procedure: RIGHT HEART CATH;  Surgeon: PJosue Hector MD;  [facility];  Service: Cardiovascular;  Laterality: N/A;  . THORACOTOMY Right 1970's    spontaneous pneumothorax - while in the [employer] . TONSILLECTOMY    . VIDEO ASSISTED THORACOSCOPY (VATS)/WEDGE RESECTION Left 11/29/2013   Procedure: Video assisted thoracoscopy for wedge resection; mini thoracotomy;  Surgeon: CRexene Alberts MD;  [facility];  Service: Thoracic;  Laterality: Left;    Family History  Problem Relation Age of Onset  . Stomach cancer Father   . Stroke Mother     Allergies  Allergen Reactions  . Anticoagulant Compound Other (See Comments)    Pt had intracranial bleed, therefore all anticoagulation is contra-indicated per Dr. KRon Parker  . Warfarin Sodium Other (See Comments)    Pt had intracranial bleed, therefore all anticoagulation is contra-indicated per Dr. KRon Parker     Current Outpatient Prescriptions on File Prior  to Visit  Medication Sig Dispense Refill  . aspirin EC 81 MG tablet Take 1 tablet (81 mg total) by mouth daily. 90 tablet 3  . atorvastatin (LIPITOR) 10 MG tablet TAKE 1 TABLET BY MOUTH EVERY DAY 90 tablet 3  . budesonide-formoterol (SYMBICORT) 160-4.5 MCG/ACT inhaler INHALE 2 PUFFS INTO THE LUNGS TWICE A DAY 10.2 Inhaler 5  . carvedilol (COREG) 3.125 MG tablet TAKE 1 TABLET BY MOUTH TWICE A DAY WITH MEALS 180 tablet 1  . furosemide (LASIX) 40 MG tablet Take 40 mg by mouth daily. May take 1 extra daily as needed for edema    . irbesartan (AVAPRO) 75 MG tablet TAKE 1 TABLET BY MOUTH EVERY DAY 90 tablet 1  . metFORMIN (GLUCOPHAGE) 500 MG tablet TAKE 1 (ONE) TABLET BY MOUTH IN THE MORNING WITH BREAKFAST AND 1/2 TABLET BY MOUTH WITH EVENING MEAL    . nitroGLYCERIN (NITROSTAT) 0.4 MG SL tablet Place 0.4 mg under the tongue every 5 (five) minutes as needed for chest pain (x 3 doses). Reported on 12/29/2015     No current facility-administered medications on file prior to visit.     BP 124/68   Temp 97.3 F (36.3 C) (Oral)   Ht '6\' 2"'$  (1.88 m)   Wt 199 lb (90.3 kg)   BMI 25.55 kg/m       Objective:   Physical Exam  Constitutional: He is  oriented to person, place, and time. He appears well-developed and well-nourished. No distress.  Cardiovascular: Normal rate, regular rhythm, normal heart sounds and intact distal pulses.  Exam reveals no gallop and no friction rub.   No murmur heard. Pulmonary/Chest: Effort normal and breath sounds normal. No respiratory distress. He has no wheezes. He has no rales. He exhibits no tenderness.  Neurological: He is alert and oriented to person, place, and time.  Skin: Skin is warm and dry. No rash noted. He is not diaphoretic. No erythema. No pallor.  Psychiatric: He has a normal mood and affect. His behavior is normal. Thought content normal.  Nursing note and vitals reviewed.     Assessment & Plan:  1. Type 2 diabetes mellitus without complication, without long-term current use of insulin (HCC) - POC HgB A1c- 7.7  - Increase metformin to 1000 BID - Cut back on carbs and sweets - Increase H2o consumption - Follow up in 3 months or sooner if needed  [name], NP

## 2016-07-27 ENCOUNTER — Encounter (HOSPITAL_COMMUNITY): Payer: Self-pay | Admitting: Radiology

## 2016-07-27 ENCOUNTER — Other Ambulatory Visit (HOSPITAL_COMMUNITY): Payer: Medicare Other

## 2016-07-27 NOTE — CV Procedure (Signed)
Called and spoke with Otila Kluver in Radiology [facility], regarding patient's wife concerns about appointment tomorrow and told her patient wanted a call back.

## 2016-07-28 ENCOUNTER — Ambulatory Visit (HOSPITAL_COMMUNITY): Payer: Medicare Other

## 2016-07-29 IMAGING — CT CT CHEST W/ CM
1 of 2 series · 14 of 32 positions shown, 18 images · IV contrast (omnipaque)
Comparison: 06/23/2014

CLINICAL DATA: Restaging left lung cancer

EXAM:
CT CHEST WITH CONTRAST
TECHNIQUE: Multidetector CT imaging of the chest was performed during
intravenous contrast administration.
CONTRAST:  80mL OMNIPAQUE IOHEXOL 300 MG/ML  SOLN

[Series 2: rtn chest with st · axial · 0.83mm/px · z∈[+1004,+1318]mm · 14 of 75 slices shown, 18 images]
[im 6/75  mediastinal]
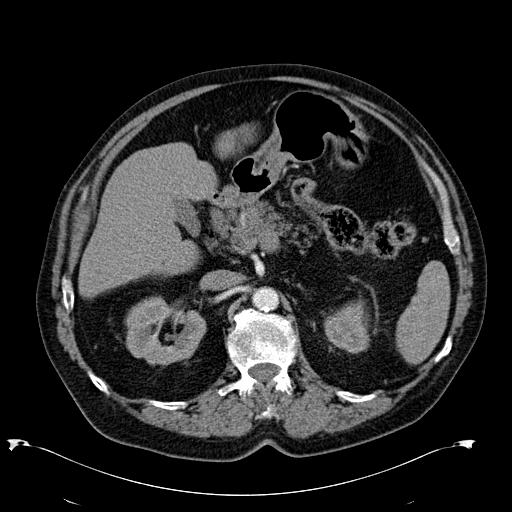
[im 6/75  lung]
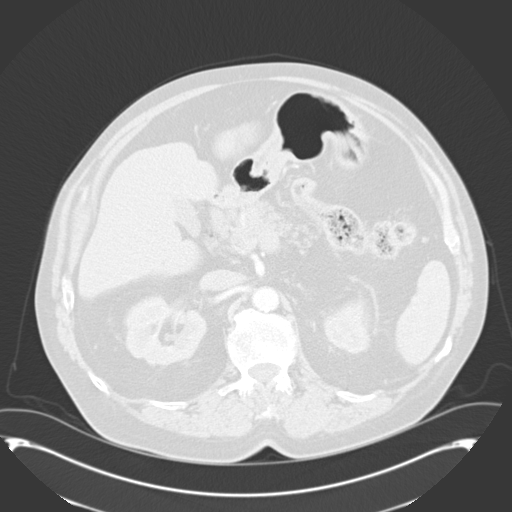
[im 12/75  lung]
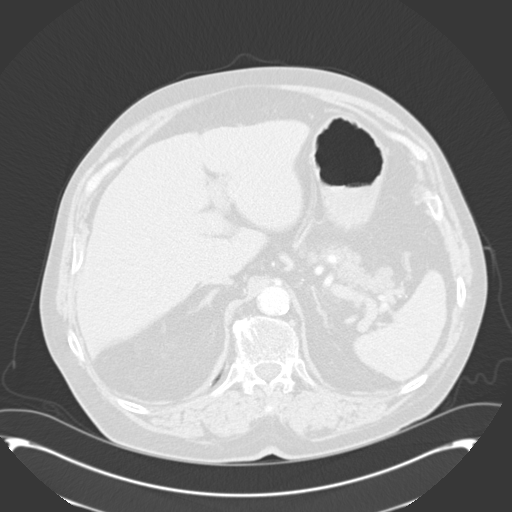
[im 18/75  lung]
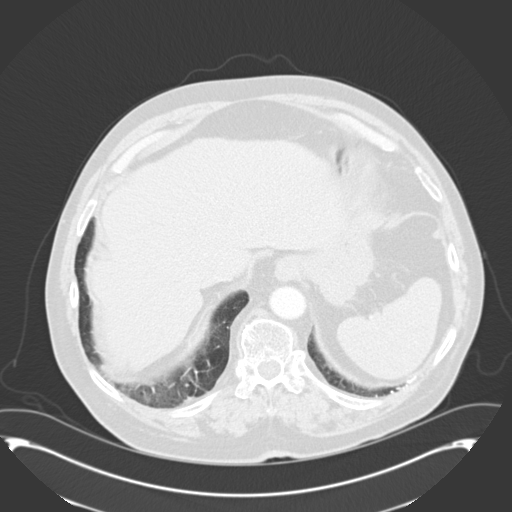
[im 23/75  lung]
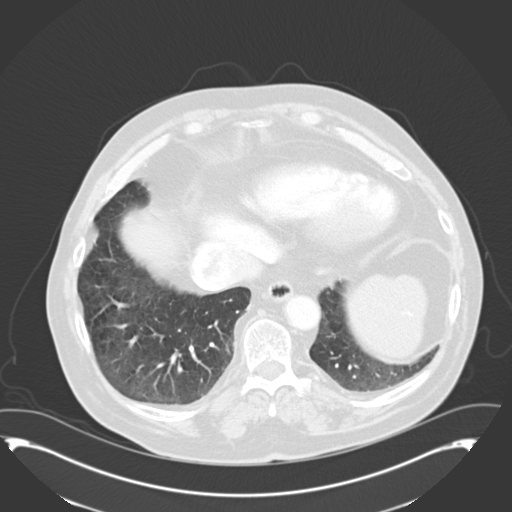
[im 29/75  mediastinal]
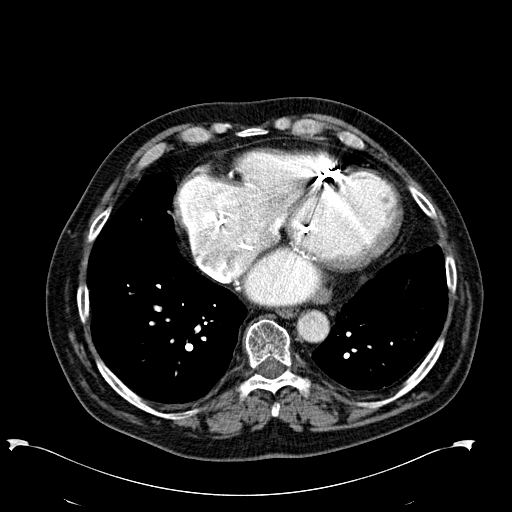
[im 29/75  lung]
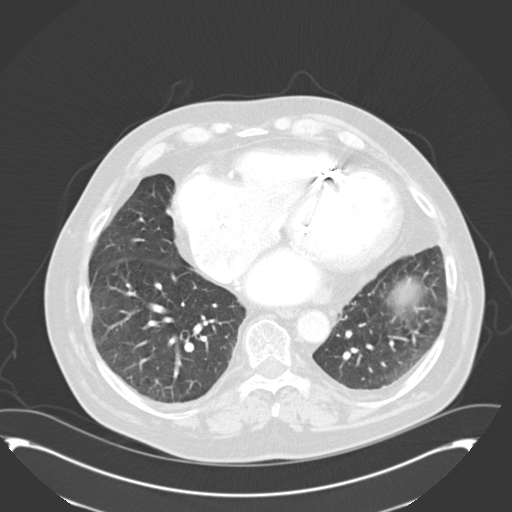
[im 35/75  lung]
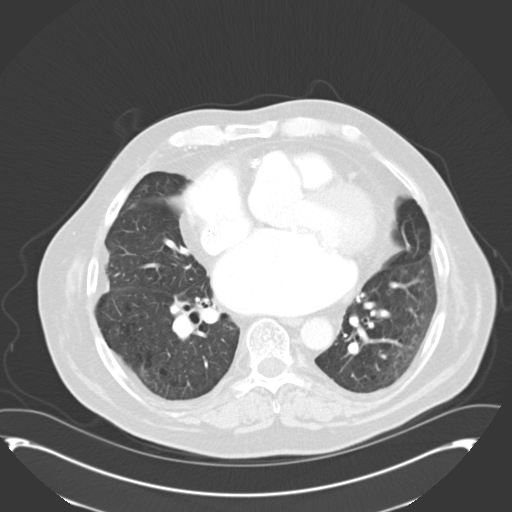
[im 36/75  lung]
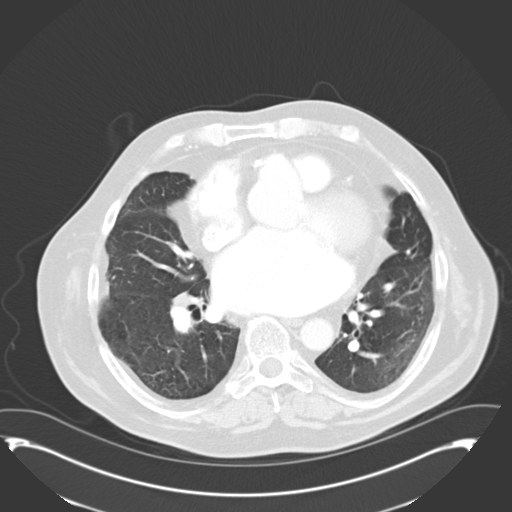
[im 38/75  lung]
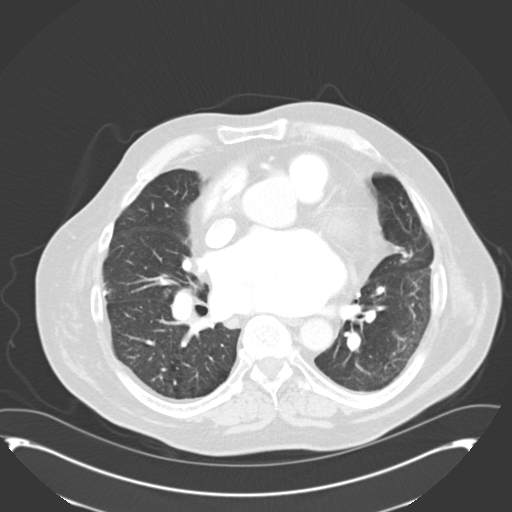
[im 40/75  mediastinal]
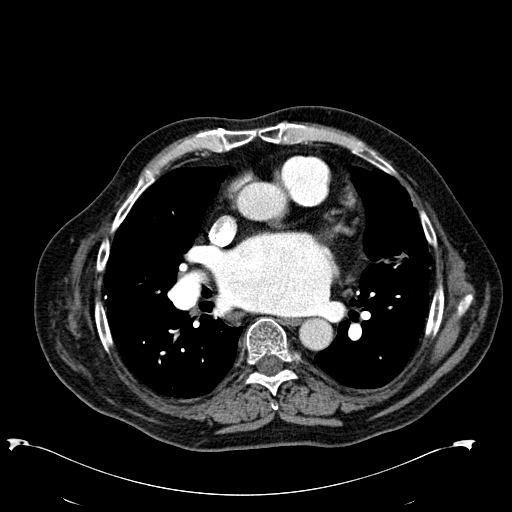
[im 40/75  lung]
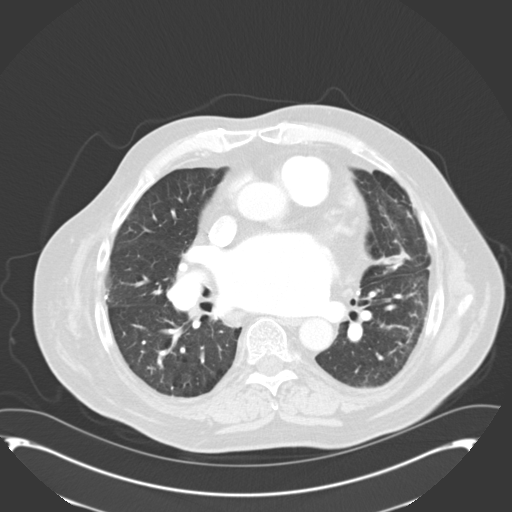
[im 46/75  lung]
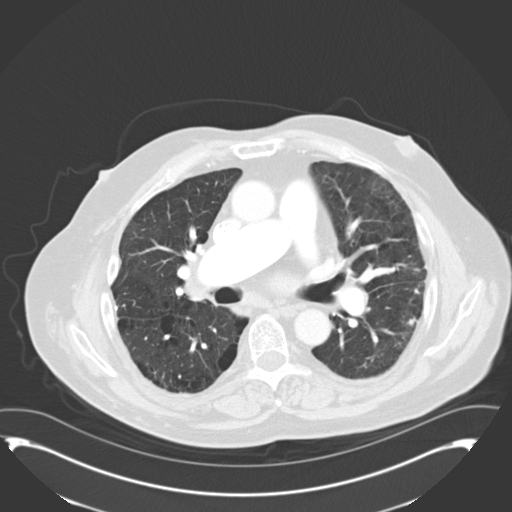
[im 52/75  lung]
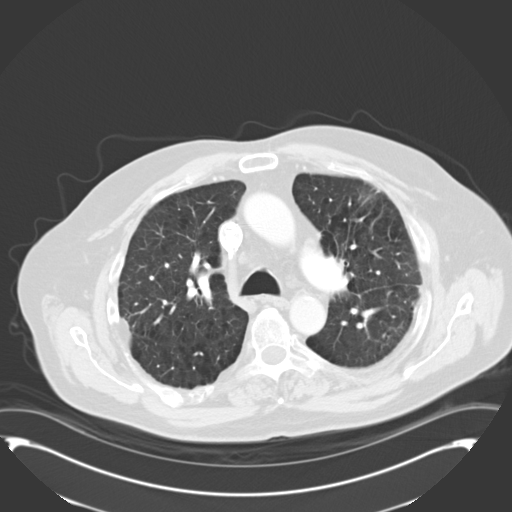
[im 57/75  lung]
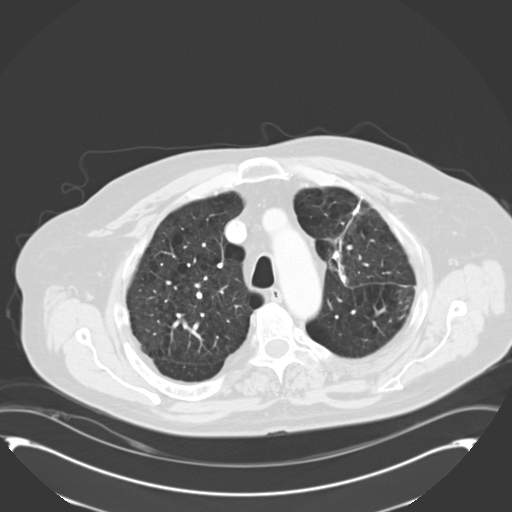
[im 63/75  mediastinal]
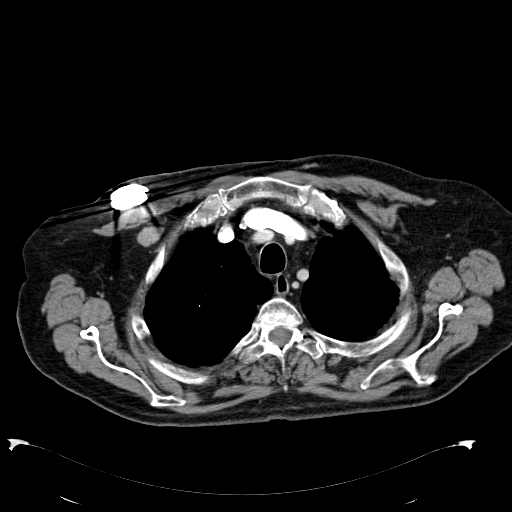
[im 63/75  lung]
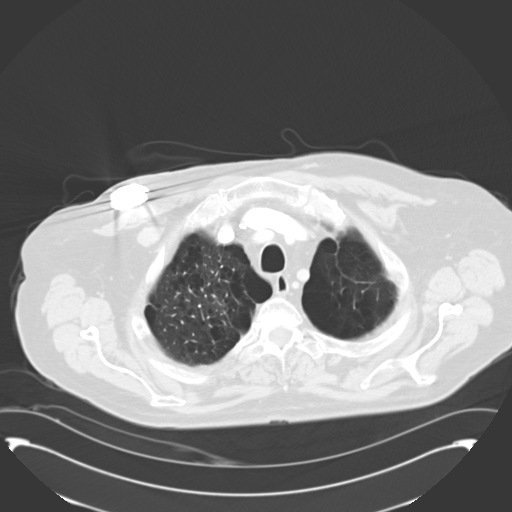
[im 69/75  lung]
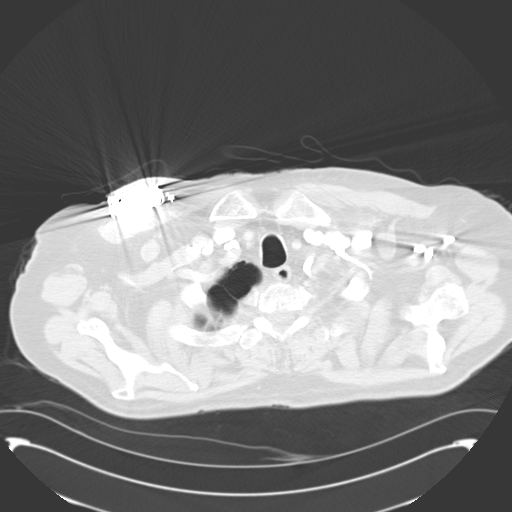

[14 of 32 positions shown; findings below may reference images not displayed]

FINDINGS: Mediastinum: Moderate cardiac enlargement. Aortic atherosclerosis
noted. There are calcifications within the RCA and LAD coronary
artery. No significant mediastinal or hilar adenopathy.

Lungs/Pleura: No pleural effusion. There is no airspace
consolidation identified. Changes of centrilobular and paraseptal
emphysema identified. Status post wedge resection of the left upper
lobe. No focal evidence to suggest residual or recurrence of tumor.
Small left upper lobe lung nodule is stable measuring 3 mm, image
21/series 5. No new or enlarging pulmonary nodules or masses.

Upper Abdomen: The liver has a slightly irregular contour compatible
with cirrhosis. The adrenal glands are unremarkable. The visualized
portions of the pancreas and spleen are normal. The visualized
portions of the kidneys are also on unremarkable.

Musculoskeletal: Multi level thoracic spondylosis identified. No
aggressive lytic or sclerotic bone lesions.
IMPRESSION: 1. Stable CT of the chest. Status post wedge resection of the left
upper lobe. No definite findings to suggest local recurrence of
disease or metastatic disease.
2. Stable 4 mm left upper lobe lung nodule. Attention on follow-up
imaging recommended.
3. Emphysema
4. Aortic atherosclerosis.
5. Cardiac enlargement
6. Cirrhosis.

## 2016-08-02 ENCOUNTER — Other Ambulatory Visit: Payer: Self-pay

## 2016-08-02 ENCOUNTER — Ambulatory Visit (HOSPITAL_COMMUNITY): Payer: Medicare Other | Attending: Internal Medicine

## 2016-08-02 DIAGNOSIS — I712 Thoracic aortic aneurysm, without rupture: Secondary | ICD-10-CM | POA: Diagnosis not present

## 2016-08-02 DIAGNOSIS — Z953 Presence of xenogenic heart valve: Secondary | ICD-10-CM | POA: Diagnosis not present

## 2016-08-02 DIAGNOSIS — I5022 Chronic systolic (congestive) heart failure: Secondary | ICD-10-CM | POA: Insufficient documentation

## 2016-08-02 DIAGNOSIS — I361 Nonrheumatic tricuspid (valve) insufficiency: Secondary | ICD-10-CM | POA: Insufficient documentation

## 2016-08-02 DIAGNOSIS — I420 Dilated cardiomyopathy: Secondary | ICD-10-CM | POA: Diagnosis not present

## 2016-08-03 ENCOUNTER — Encounter: Payer: Self-pay | Admitting: Physician Assistant

## 2016-08-04 ENCOUNTER — Ambulatory Visit (HOSPITAL_COMMUNITY)
Admission: RE | Admit: 2016-08-04 | Discharge: 2016-08-04 | Disposition: A | Discharge status: Home / Self Care | Payer: Medicare Other | Source: Ambulatory Visit | Attending: Gastroenterology | Admitting: Gastroenterology

## 2016-08-04 ENCOUNTER — Telehealth: Payer: Self-pay | Admitting: *Deleted

## 2016-08-04 DIAGNOSIS — N281 Cyst of kidney, acquired: Secondary | ICD-10-CM | POA: Insufficient documentation

## 2016-08-04 DIAGNOSIS — R188 Other ascites: Secondary | ICD-10-CM

## 2016-08-04 DIAGNOSIS — I5022 Chronic systolic (congestive) heart failure: Secondary | ICD-10-CM

## 2016-08-04 DIAGNOSIS — R932 Abnormal findings on diagnostic imaging of liver and biliary tract: Secondary | ICD-10-CM | POA: Diagnosis not present

## 2016-08-04 DIAGNOSIS — K7460 Unspecified cirrhosis of liver: Secondary | ICD-10-CM

## 2016-08-04 NOTE — Telephone Encounter (Signed)
Pt notified of echo results and findings by phone with verbal understanding. Pt is agreeable to having Chest CT-A due to unable to view aorta on echo well . I will have [facility] call the pt and schedule test.

## 2016-08-08 ENCOUNTER — Other Ambulatory Visit: Payer: Medicare Other

## 2016-08-08 DIAGNOSIS — R188 Other ascites: Principal | ICD-10-CM

## 2016-08-08 DIAGNOSIS — K7460 Unspecified cirrhosis of liver: Secondary | ICD-10-CM

## 2016-08-09 LAB — AFP TUMOR MARKER: AFP-Tumor Marker: 1.7 ng/mL (ref <6.1)

## 2016-08-11 ENCOUNTER — Ambulatory Visit (INDEPENDENT_AMBULATORY_CARE_PROVIDER_SITE_OTHER)
Admission: RE | Admit: 2016-08-11 | Discharge: 2016-08-11 | Disposition: A | Discharge status: Home / Self Care | Payer: Medicare Other | Source: Ambulatory Visit | Attending: Physician Assistant | Admitting: Physician Assistant

## 2016-08-11 ENCOUNTER — Encounter: Payer: Self-pay | Admitting: Physician Assistant

## 2016-08-11 DIAGNOSIS — I5022 Chronic systolic (congestive) heart failure: Secondary | ICD-10-CM | POA: Diagnosis not present

## 2016-08-11 DIAGNOSIS — I700 Atherosclerosis of aorta: Secondary | ICD-10-CM | POA: Insufficient documentation

## 2016-08-11 DIAGNOSIS — I712 Thoracic aortic aneurysm, without rupture, unspecified: Secondary | ICD-10-CM

## 2016-08-11 HISTORY — DX: Thoracic aortic aneurysm, without rupture: I71.2

## 2016-08-11 HISTORY — DX: Thoracic aortic aneurysm, without rupture, unspecified: I71.20

## 2016-08-11 MED ORDER — IOPAMIDOL (ISOVUE-370) INJECTION 76%
100.0000 mL | Freq: Once | INTRAVENOUS | Status: AC | PRN
  Administered 2016-08-11: 100 mL via INTRAVENOUS

## 2016-08-12 ENCOUNTER — Telehealth: Payer: Self-pay | Admitting: *Deleted

## 2016-08-12 DIAGNOSIS — I712 Thoracic aortic aneurysm, without rupture, unspecified: Secondary | ICD-10-CM

## 2016-08-12 NOTE — Telephone Encounter (Signed)
S/w both pt and his wife about CT-A results and findings by phone.Both the pt and his wife verbalized understanding to results given today as well as to the recommendations. Pt and wife agreeable to plan of care; Refer to [facility] to be followed by the thoracic surgeon for thoracic aortic aneurysm. . Pt and wife both aware pt should have CT scan or MRI in 6 months per [name], PA. Referral placed today to [facility].

## 2016-08-13 ENCOUNTER — Other Ambulatory Visit: Payer: Self-pay | Admitting: Adult Health

## 2016-08-26 ENCOUNTER — Other Ambulatory Visit: Payer: Self-pay | Admitting: *Deleted

## 2016-08-26 ENCOUNTER — Institutional Professional Consult (permissible substitution) (INDEPENDENT_AMBULATORY_CARE_PROVIDER_SITE_OTHER): Payer: Medicare Other | Admitting: Thoracic Surgery (Cardiothoracic Vascular Surgery)

## 2016-08-26 ENCOUNTER — Encounter: Payer: Self-pay | Admitting: Thoracic Surgery (Cardiothoracic Vascular Surgery)

## 2016-08-26 VITALS — BP 112/68 | HR 77 | Resp 18 | Ht 74.0 in | Wt 199.0 lb

## 2016-08-26 DIAGNOSIS — C3412 Malignant neoplasm of upper lobe, left bronchus or lung: Secondary | ICD-10-CM

## 2016-08-26 DIAGNOSIS — Q2549 Other congenital malformations of aorta: Secondary | ICD-10-CM

## 2016-08-26 DIAGNOSIS — Z953 Presence of xenogenic heart valve: Secondary | ICD-10-CM

## 2016-08-26 DIAGNOSIS — I509 Heart failure, unspecified: Secondary | ICD-10-CM

## 2016-08-26 DIAGNOSIS — I4891 Unspecified atrial fibrillation: Secondary | ICD-10-CM

## 2016-08-26 DIAGNOSIS — I712 Thoracic aortic aneurysm, without rupture, unspecified: Secondary | ICD-10-CM

## 2016-08-26 DIAGNOSIS — Q2543 Congenital aneurysm of aorta: Secondary | ICD-10-CM

## 2016-08-26 HISTORY — DX: Other congenital malformations of aorta: Q25.49

## 2016-08-26 HISTORY — DX: Congenital aneurysm of aorta: Q25.43

## 2016-08-26 NOTE — Progress Notes (Signed)
[name]       [place] [address]             [place] REPORT  Referring Provider is [name], PA-C  Primary Cardiologist is [name], MD Primary Electrophysiologist is [name], MD Primary Pulmonologist is [name], MD Primary Oncologist is [name], MD PCP is [name], NP  Chief Complaint  Patient presents with  . TAA    CTA CHEST 08/11/16    HPI:  Patient is a 71 year old male with complex past medical history who has been referred for evaluation of recently discovered thoracic aortic aneurysm.  The patient has a remote history of heavy tobacco abuse, COPD for which the patient remains O2 dependent, hypertension, nonischemic cardiomyopathy, permanent atrial fibrillation status post AV node ablation with permanent pacemaker and ICD placement, and chronic combined systolic and diastolic congestive heart failure. In 2009 the patient presented with bacterial endocarditis complicated by embolic stroke, severe mitral regurgitation and congestive heart failure.  He ultimately underwent mitral valve replacement using a bioprosthetic tissue valve, closure of secundum type atrial septal defect, and over sewing of left atrial appendage via a right mini thoracotomy approach in January 2010.  He recovered from his surgery uneventfully and remained stable until 2015 when he was hospitalized for pneumonia, COPD exacerbation, an acute exacerbation of congestive heart failure. CT scan performed at that time revealed a small spiculated lesion in the left upper lobe for which he eventually underwent video-assisted thoracoscopy for wedge resection for what turned out to be T1aN0 stage IA adenocarcinoma of the lung.  He again recovered uneventfully although he has remained oxygen dependent ever since. He has been followed regular by Dr. [name] and remains cancer free at this time.  He has had follow-up  CT scans of the chest performed on a regular basis ever since. He was seen in follow-up recently by [name] at [facility] on 07/05/2016. The patient was noted to be doing well at that time and overall very stable from a cardiac standpoint. A routine follow-up echocardiogram was performed 08/02/2016. Left ventricular systolic function remained essentially stable with ejection fraction estimated 40-45%. The patient's mitral valve prosthesis was functioning normally with no mitral regurgitation appreciated and mean transvalvular gradient across the valve estimated at only 3 mmHg. The proximal ascending thoracic aorta was dilated and somewhat abnormal in appearance. CT scan or TEE were recommended to definitively rule out the presence of an aortic dissection. CT angiogram of the chest was performed 08/11/2016. There was aneurysmal dilatation of the aortic root with maximum diameter reported 5.1 cm. There was no evidence of aortic dissection.  The patient was referred for surgical consultation.  The patient is married and lives with his wife locally in [place]. He has 1 adult daughter who is very supportive and accompanies the patient to our office for consultation today. The patient has been retired since 2009, having previously worked for the [employer]. He is not very active physically but he is reportedly functionally independent. He is oxygen dependent because of his COPD and chronic lung disease related to congestive heart failure. He wears oxygen continuously. He states that if oxygen is discontinued or his tach runs out, he does okay for a little while but developed progressive shortness of breath within minutes to hours.  He gets short of breath with moderate level exertion but is comfortable with ordinary activities  around the house. His primary limitation is that of exertional shortness of breath. He specifically denies any history of chest pain or back pain either with activity or at  rest. He denies any PND, orthopnea, or lower extremity edema. Appetite is stable. He has no difficulty swallowing. The remainder of his review of systems is noncontributory.  Past Medical History:  Diagnosis Date  . Atrial fibrillation ([place])    AV Node ablation January, 2010, for rapid atrial fib  . Atrial septal defect    Closed with surgery January, 2010  . Automatic implantable cardioverter-defibrillator in situ    LV dysfunction and pacer needed for AV node lesion  . Cardiomyopathy    non-ischemic  . CHF (congestive heart failure) ([place])   . Chronic combined systolic and diastolic CHF (congestive heart failure) ([place])   . Colon polyps   . COPD (chronic obstructive pulmonary disease) (HCC)    O2- 2 liters, nasal cannula, q night   . COPD GOLD II 01/11/2007   PFT's 09/25/13  FEV1  2.18 (61%) ratio 64 no change p B2 and DLC0  32% corrects to 68% - trial off advair and acei rec starting  08/23/2013     . CVA (cerebral vascular accident) ([place]) 2009   denies residual on 08/14/2013  . Diabetes mellitus without complication ([place])   . Dyslipidemia   . Dysrhythmia   . Ejection fraction < 50%   . Endocarditis    Bacterial, 2009  . Headache(784.0)    related to stroke only  . HLD (hyperlipidemia)   . Hypertension   . Intracranial hemorrhage (HCC)    Coumadin cannot be used because of the history of his bleed  . Lung cancer ([place]) 11/29/2013   T1N0 Stage Ia non-small cell carcinoma left lung treated with wedge resection  . Mitral regurgitation    Severe symptomatic primary MR due to bacterial endocarditis, treated w/ MVR // Echo 1/18 EF 40-45, diffuse HK, dilated aorta at 42 mm/aortic root 47 mm; linear echogenic structure in ascending aorta - suspect reverberation artifact / consider CT or TEE to rule out dissection flap, bioprosthetic MVR with mean gradient 3, severe LAE, low normal RVSF, severe RAE, moderate TR, PASP 42  . Myocardial infarction 2010  . Pacemaker   . Permanent atrial  fibrillation    Originally Coumadin use for atrial fibrillation  //   he had intracerebral hemorrhage with an INR of 2.3 June, 2009. Anticoagulation could no longer be used.  //  Rapid atrial fibrillation after inferior MI October, 2010..........Marland Kitchen AV node ablation done at that time with ICD pacemaker placed (EF 35%).   //   Left atrial appendage tied off at the time of mitral valve surgery January, 2010 (maze pro  . Pneumonia    "this is my first case" (08/14/2013)  . Prosthetic valve dysfunction    Mild mitral stenosis  . Pulmonary hypertension    Moderate  . Renal artery stenosis (HCC)    Mild by history  . Spontaneous pneumothorax    right thoracotomy - distant past  . Status post minimally invasive mitral valve replacement with bioprosthetic valve    33 mm Medtronic Mosaic porcine bioprosthesis placed via right mini thoracotomy for bacterial endocarditis complicated by severe MR and CHF   . Thoracic aortic aneurysm ([place]) 08/11/2016   a - Chest CTA 1/18:  Aneurysmal dilatation of aortic root is noted at 5.1 cm.     Past Surgical History:  Procedure Laterality Date  . APPENDECTOMY    .  ASD REPAIR, SECUNDUM  07/17/2008   pericardial patch closure of ASD  . AV NODE ABLATION  07/2008   for rapid atrial fib  . CARDIAC CATHETERIZATION    . CARDIAC DEFIBRILLATOR PLACEMENT  ~ [address] [employer]  . CARDIAC VALVE REPLACEMENT    . CATARACT EXTRACTION W/ INTRAOCULAR LENS  IMPLANT, BILATERAL Bilateral   . IMPLANTABLE CARDIOVERTER DEFIBRILLATOR (ICD) GENERATOR CHANGE N/A 02/06/2012   Procedure: ICD GENERATOR CHANGE;  Surgeon: [name], MD;  [facility] C[facility] LAB;  Service: Cardiovascular;  Laterality: N/A;  . INSERT / REPLACE / REMOVE PACEMAKER    . LEEP    . MASS BIOPSY Left    neck mass  . MITRAL VALVE REPLACEMENT Right 07/17/2008   52m Medtronic Mosaic porcine bioprosthesis  . PENILE PROSTHESIS IMPLANT    . RIGHT HEART CATHETERIZATION N/A 08/16/2013   Procedure: RIGHT HEART CATH;  Surgeon:  PJosue Hector MD;  [facility];  Service: Cardiovascular;  Laterality: N/A;  . THORACOTOMY Right 1970's   spontaneous pneumothorax - while in the [employer] . TONSILLECTOMY    . VIDEO ASSISTED THORACOSCOPY (VATS)/WEDGE RESECTION Left 11/29/2013   Procedure: Video assisted thoracoscopy for wedge resection; mini thoracotomy;  Surgeon: CRexene Alberts MD;  [facility];  Service: Thoracic;  Laterality: Left;    Family History  Problem Relation Age of Onset  . Stomach cancer Father   . Stroke Mother     Social History   Social History  . Marital status: Married    Spouse name: JGregary Signs . Number of children: 0  . Years of education: College   Occupational History  . Retired Retired    PTour manager  Social History Main Topics  . Smoking status: Former Smoker    Packs/day: 1.00    Years: 45.00    Types: Cigarettes    Quit date: 08/11/2013  . Smokeless tobacco: Never Used     Comment: 08/14/2013 "quit smoking in 2009"  . Alcohol use No     Comment: 08/14/2013 "used to drink beer; quit:in 1982"  . Drug use: No  . Sexual activity: Yes   Other Topics Concern  . Not on file   Social History Narrative   Patient lives at home with his spouse.   Caffeine Use: none   Worked for the [employer]   Has two boys and a girl. All live local.     Current Outpatient Prescriptions  Medication Sig Dispense Refill  . aspirin EC 81 MG tablet Take 1 tablet (81 mg total) by mouth daily. 90 tablet 3  . atorvastatin (LIPITOR) 10 MG tablet TAKE 1 TABLET BY MOUTH EVERY DAY 90 tablet 2  . budesonide-formoterol (SYMBICORT) 160-4.5 MCG/ACT inhaler INHALE 2 PUFFS INTO THE LUNGS TWICE A DAY 10.2 Inhaler 5  . carvedilol (COREG) 3.125 MG tablet TAKE 1 TABLET BY MOUTH TWICE A DAY WITH MEALS 180 tablet 1  . furosemide (LASIX) 40 MG tablet Take 40 mg by mouth daily. May take 1 extra daily as needed for edema    . irbesartan (AVAPRO) 75 MG tablet TAKE 1 TABLET BY MOUTH EVERY DAY 90 tablet 1  .  metFORMIN (GLUCOPHAGE) 500 MG tablet 1,000 mg 2 (two) times daily with a meal. TAKE 1 (ONE) TABLET BY MOUTH IN THE MORNING WITH BREAKFAST AND 1/2 TABLET BY MOUTH WITH EVENING MEAL     . nitroGLYCERIN (NITROSTAT) 0.4 MG SL tablet Place 0.4 mg under the tongue every 5 (  five) minutes as needed for chest pain (x 3 doses). Reported on 12/29/2015     No current facility-administered medications for this visit.     Allergies  Allergen Reactions  . Anticoagulant Compound Other (See Comments)    Pt had intracranial bleed, therefore all anticoagulation is contra-indicated per Dr. Ron Parker.  . Warfarin Sodium Other (See Comments)    Pt had intracranial bleed, therefore all anticoagulation is contra-indicated per Dr. Ron Parker.       Review of Systems:   General:  normal appetite, normal energy, no weight gain, no weight loss, no fever  Cardiac:  no chest pain with exertion, no chest pain at rest, + SOB with exertion, no resting SOB, no PND, no orthopnea, no palpitations, + arrhythmia, + atrial fibrillation, no LE edema, occasional dizzy spells, no syncope  Respiratory:  + exertional shortness of breath, + home oxygen, no productive cough, no dry cough, no bronchitis, no wheezing, no hemoptysis, no asthma, no pain with inspiration or cough, no sleep apnea, no CPAP at night  GI:   no difficulty swallowing, no reflux, no frequent heartburn, no hiatal hernia, no abdominal pain, no constipation, no diarrhea, no hematochezia, no hematemesis, no melena  GU:   no dysuria,  no frequency, no urinary tract infection, no hematuria, no enlarged prostate, no kidney stones, no kidney disease  Vascular:  no pain suggestive of claudication, no pain in feet, no leg cramps, no varicose veins, no DVT, no non-healing foot ulcer  Neuro:   + stroke, no TIA's, no seizures, no headaches, no temporary blindness one eye,  + slurred speech, no peripheral neuropathy, no chronic pain, no instability of gait, no memory/cognitive  dysfunction  Musculoskeletal: no arthritis, no joint swelling, no myalgias, no difficulty walking, somewhat decreased mobility   Skin:   no rash, no itching, no skin infections, no pressure sores or ulcerations  Psych:   no anxiety, no depression, no nervousness, no unusual recent stress  Eyes:   no blurry vision, no floaters, no recent vision changes, + wears glasses or contacts  ENT:   + hearing loss, no loose or painful teeth, no dentures  Hematologic:  + easy bruising, no abnormal bleeding, no clotting disorder, no frequent epistaxis  Endocrine:  + diabetes, does check CBG's at home     Physical Exam:   BP 112/68 (BP Location: Right Arm, Patient Position: Sitting, Cuff Size: Large)   Pulse 77   Resp 18   Ht '6\' 2"'$  (1.88 m)   Wt 199 lb (90.3 kg)   SpO2 95% Comment: ON 2L O2  BMI 25.55 kg/m   General:  Well-appearing but appears older than stated age  HEENT:  Unremarkable   Neck:   no JVD, no bruits, no adenopathy   Chest:   clear to auscultation, symmetrical breath sounds, no wheezes, no rhonchi   CV:   RRR, no  murmur   Abdomen:  soft, non-tender, no masses   Extremities:  warm, well-perfused, pulses diminshed, no LE edema  Rectal/GU  Deferred  Neuro:   Grossly non-focal and symmetrical throughout  Skin:   Clean and dry, no rashes, no breakdown   Diagnostic Tests:  Transthoracic Echocardiography  Patient:    [name], [name] MR #:       [id number] Study Date: 08/02/2016 Gender:     M Age:        39 Height:     188 cm Weight:     90.3 kg BSA:  2.18 m^2 Pt. Status: Room:   ATTENDING    Letha Cape, Scott T  REFERRING    [name] T  SONOGRAPHER  Marygrace Drought, RCS  PERFORMING   [facility], Outpatient  cc:  ------------------------------------------------------------------- LV EF: 40% -   45%  ------------------------------------------------------------------- Indications:      CHF  (!50.22).  ------------------------------------------------------------------- History:   PMH:  Hyperlipidemia. MVR.  Congestive heart failure. Congestive heart failure.  Risk factors:  Diabetes mellitus. Dyslipidemia.  ------------------------------------------------------------------- Study Conclusions  - Left ventricle: The cavity size was mildly dilated. Wall   thickness was normal. Systolic function was mildly to moderately   reduced. The estimated ejection fraction was in the range of 40%   to 45%. Diffuse hypokinesis. The study is not technically   sufficient to allow evaluation of LV diastolic function. - Aorta: Dilated aorta. Linear echogenic structure seen in the   ascending aorta - suspect reverberation artifact. Consider CT or   TEE to definitively r/o dissection flap. Aortic root dimension:   42 mm (ED, M-mode). Aortic root dimension: 47 mm (ED). - Mitral valve: Bioprosthetic MVR. No obstruction. Mean gradient   (D): 3 mm Hg. Valve area by pressure half-time: 1.67 cm^2. Valve   area by continuity equation (using LVOT flow): 1.9 cm^2. - Left atrium: Severely dilated. - Right ventricle: The cavity size was moderately dilated. Pacer   wire or catheter noted in right ventricle. Systolic function was   low normal. - Right atrium: Severely dilated. Pacer wire or catheter noted in   right atrium. - Tricuspid valve: There was moderate regurgitation. - Pulmonary arteries: PA peak pressure: 42 mm Hg (S). - Inferior vena cava: The vessel was normal in size. The   respirophasic diameter changes were in the normal range (= 50%),   consistent with normal central venous pressure.  Impressions:  - Compared to a prior study in 2015, the LVEF has improved to   40-45%. the bioprosthetic mitral valve appears to be functioning   normally. There is a linear density in the ascending aorta -   which is mildly dilated, this is likely a reverberation artifact,   but consider TEE or CT  to definitively r/o dissection if   clinically indicated.  ------------------------------------------------------------------- Study data:  Comparison was made to the study of 08/23/2013.  Study status:  Routine.  Procedure:  The patient reported no pain pre or post test. Transthoracic echocardiography. Image quality was adequate.          Transthoracic echocardiography.  M-mode, complete 2D, spectral Doppler, and color Doppler.  Birthdate: Patient birthdate: 1945/08/11.  Age:  Patient is 71 yr old.  Sex: Gender: male.    BMI: 25.5 kg/m^2.  Blood pressure:     124/68 Patient status:  Outpatient.  Study date:  Study date: 08/02/2016. Study time: 03:22 PM.  Location:  [facility]  -------------------------------------------------------------------  ------------------------------------------------------------------- Left ventricle:  The cavity size was mildly dilated. Wall thickness was normal. Systolic function was mildly to moderately reduced. The estimated ejection fraction was in the range of 40% to 45%. Diffuse hypokinesis. The study is not technically sufficient to allow evaluation of LV diastolic function.  ------------------------------------------------------------------- Aortic valve:   Structurally normal valve. Trileaflet. Cusp separation was normal.  Doppler:  Transvalvular velocity was within the normal range. There was no stenosis. There was no regurgitation.  ------------------------------------------------------------------- Aorta:  Dilated aorta. Linear echogenic structure seen in the ascending aorta - suspect reverberation artifact. Consider  CT or TEE to definitively r/o dissection flap.  ------------------------------------------------------------------- Mitral valve:  Bioprosthetic MVR. No obstruction.  Doppler: Valve area by pressure half-time: 1.67 cm^2. Indexed valve area by pressure half-time: 0.77 cm^2/m^2. Valve area by continuity equation  (using LVOT flow): 1.9 cm^2. Indexed valve area by continuity equation (using LVOT flow): 0.87 cm^2/m^2.    Mean gradient (D): 3 mm Hg. Peak gradient (D): 6 mm Hg.  ------------------------------------------------------------------- Left atrium:  Severely dilated.  ------------------------------------------------------------------- Right ventricle:  The cavity size was moderately dilated. Pacer wire or catheter noted in right ventricle. Systolic function was low normal.  ------------------------------------------------------------------- Pulmonic valve:    The valve appears to be grossly normal. Doppler:  There was trivial regurgitation.  ------------------------------------------------------------------- Tricuspid valve:   Doppler:  There was moderate regurgitation.  ------------------------------------------------------------------- Pulmonary artery:   The main pulmonary artery was normal-sized.  ------------------------------------------------------------------- Right atrium:  Severely dilated. Pacer wire or catheter noted in right atrium.  ------------------------------------------------------------------- Pericardium:  There was no pericardial effusion.  ------------------------------------------------------------------- Systemic veins: Inferior vena cava: The vessel was normal in size. The respirophasic diameter changes were in the normal range (= 50%), consistent with normal central venous pressure. Diameter: 19 mm.  ------------------------------------------------------------------- Post procedure conclusions Ascending Aorta:  - Dilated aorta. Linear echogenic structure seen in the ascending   aorta - suspect reverberation artifact. Consider CT or TEE to   definitively r/o dissection flap.  ------------------------------------------------------------------- Measurements   IVC                                       Value          Reference  ID                                         19    mm       ---------    Left ventricle                            Value          Reference  LV ID, ED, PLAX chordal           (H)     58.2  mm       43 - 52  LV ID, ES, PLAX chordal           (H)     42.2  mm       23 - 38  LV fx shortening, PLAX chordal    (L)     27    %        >=29  LV PW thickness, ED                       14    mm       ---------  IVS/LV PW ratio, ED                       0.72           <=1.3  Stroke volume, 2D                         75    ml       ---------  Stroke volume/bsa, 2D                     34    ml/m^2   ---------  LV ejection fraction, 1-p A4C             44    %        ---------  LV e&', lateral                            3.73  cm/s     ---------  LV E/e&', lateral                          33.24          ---------  LV e&', medial                             5.26  cm/s     ---------  LV E/e&', medial                           23.57          ---------  LV e&', average                            4.5   cm/s     ---------  LV E/e&', average                          27.59          ---------    Ventricular septum                        Value          Reference  IVS thickness, ED                         10.1  mm       ---------    LVOT                                      Value          Reference  LVOT ID, S                                29    mm       ---------  LVOT area                                 6.61  cm^2     ---------  LVOT peak velocity, S                     57.9  cm/s     ---------  LVOT mean velocity, S                     37.6  cm/s     ---------  LVOT VTI, S  11.3  cm       ---------    Aorta                                     Value          Reference  Aortic root ID, ED                        47    mm       ---------  Aortic root ID, ED, MM            (H)     42    mm       20 - 37    Left atrium                               Value          Reference  LA ID, A-P, ES                             71    mm       ---------  LA ID/bsa, A-P                    (H)     3.26  cm/m^2   <=2.2  LA volume, S                              183   ml       ---------  LA volume/bsa, S                          84    ml/m^2   ---------  LA volume, ES, 1-p A4C                    141   ml       ---------  LA volume/bsa, ES, 1-p A4C                64.7  ml/m^2   ---------  LA volume, ES, 1-p A2C                    222   ml       ---------  LA volume/bsa, ES, 1-p A2C                101.9 ml/m^2   ---------    Mitral valve                              Value          Reference  Mitral E-wave peak velocity               124   cm/s     ---------  Mitral mean velocity, D                   84.7  cm/s     ---------  Mitral deceleration time          (H)     437   ms  150 - 230  Mitral pressure half-time                 123   ms       ---------  Mitral mean gradient, D                   3     mm Hg    ---------  Mitral peak gradient, D                   6     mm Hg    ---------  Mitral valve area, PHT, DP                1.67  cm^2     ---------  Mitral valve area/bsa, PHT, DP            0.77  cm^2/m^2 ---------  Mitral valve area, LVOT                   1.9   cm^2     ---------  continuity  Mitral valve area/bsa, LVOT               0.87  cm^2/m^2 ---------  continuity  Mitral annulus VTI, D                     39.3  cm       ---------    Pulmonary arteries                        Value          Reference  PA pressure, S, DP                (H)     42    mm Hg    <=30    Tricuspid valve                           Value          Reference  Tricuspid regurg peak velocity            314   cm/s     ---------  Tricuspid peak RV-RA gradient             39    mm Hg    ---------  Tricuspid maximal regurg                  314   cm/s     ---------  velocity, PISA    Systemic veins                            Value          Reference  Estimated CVP                             3     mm Hg     ---------    Right ventricle                           Value          Reference  RV pressure, S, DP                (H)  42    mm Hg    <=30  RV s&', lateral, S                         10.1  cm/s     ---------  Legend: (L)  and  (H)  mark values outside specified reference range.  ------------------------------------------------------------------- Prepared and Electronically Authenticated by  Lyman Bishop MD 2018-01-23T18:16:49    CLINICAL DATA:  Possible aortic dissection.  EXAM: CT ANGIOGRAPHY CHEST WITH CONTRAST  TECHNIQUE: Multidetector CT imaging of the chest was performed using the standard protocol during bolus administration of intravenous contrast. Multiplanar CT image reconstructions and MIPs were obtained to evaluate the vascular anatomy.  CONTRAST:  100 mL of Isovue 370 intravenously.  COMPARISON:  CT scan of December 25, 2015.  FINDINGS: Cardiovascular: Aneurysmal dilatation of aortic root is noted measured at 5.1 cm. The remaining portion of the thoracic aorta is normal in caliber. There is no evidence of thoracic aortic dissection. Great vessels are widely patent without significant stenosis. Atherosclerosis of descending thoracic aorta is noted. Mild coronary artery calcifications are noted. Right-sided pacemaker is in grossly good position. Enlargement of pulmonary arteries is noted suggesting pulmonary artery hypertension. Enlargement of left atrium is noted suggesting mitral valve disorder.  Mediastinum/Nodes: Stable mildly enlarged mediastinal adenopathy is noted which most likely is inflammatory or reactive in etiology.  Lungs/Pleura: No pneumothorax or pleural effusion is noted. Emphysematous disease is noted in the upper lobes bilaterally.  Upper Abdomen: No acute abnormality.  Musculoskeletal: No chest wall abnormality. No acute or significant osseous findings.  Review of the MIP images confirms the above  findings.  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 There is no evidence of thoracic aortic dissection. Atherosclerosis of thoracic aorta is noted. Recommend semi-annual imaging followup by CTA or MRA and referral to cardiothoracic surgery if not already obtained. This recommendation follows 2010 ACCF/AHA/AATS/ACR/ASA/SCA/SCAI/SIR/STS/SVM Guidelines for the Diagnosis and Management of Patients With Thoracic Aortic Disease. Circulation. 2010; 121: e266-e36.  Mild coronary artery calcifications are noted suggesting coronary artery disease.  Stable mildly enlarged mediastinal adenopathy is noted which most likely is inflammatory or reactive in etiology.  Enlarged pulmonary arteries are noted suggesting pulmonary artery hypertension.  Stable enlargement of left atrium is noted.   Electronically Signed   By: Marijo Conception, M.D.   On: 08/11/2016 11:52   CLINICAL DATA:  Left-sided lung cancer diagnosed in 2014 with surgery only. Left-sided lobectomy.  EXAM: CT CHEST WITH CONTRAST  TECHNIQUE: Multidetector CT imaging of the chest was performed during intravenous contrast administration.  CONTRAST:  1m ISOVUE-300 IOPAMIDOL (ISOVUE-300) INJECTION 61%  COMPARISON:  Chest radiograph of 09/25/2015. Most recent chest CT of 06/22/2015.  FINDINGS: Mediastinum/Nodes: No supraclavicular adenopathy. Aortic atherosclerosis. Pacer. Tortuous thoracic aorta. Moderate cardiomegaly with moderate to marked left atrial enlargement. Multivessel coronary artery atherosclerosis. Pulmonary artery enlargement, including 3.4 cm right pulmonary artery. No central pulmonary embolism, on this non-dedicated study. Node within the subcarinal station with extension into the azygoesophageal recess measures 11 mm and is similar.  There is also borderline right hilar adenopathy at 1.3 cm on image 78/series 2, similar.  Lungs/Pleura: Minimal right-sided  pleural thickening. Advanced bullous type emphysema.  Surgical sutures in the left upper lobe, without locally recurrent disease.  2 mm left upper lobe pulmonary nodule on image 49/ series 5 is not significantly changed.  Left base nodules likely represent calcified granulomas and are at similar, on the  order of 2 mm or less.  Upper abdomen: Normal imaged portions of the liver, spleen, stomach, pancreas, gallbladder, biliary tract,, right kidney. Incompletely imaged low-density left renal lesion is likely a cyst. Abdominal aortic atherosclerosis.  Musculoskeletal: Mild left-sided gynecomastia. Mild L1 superior endplate irregularity with sclerosis, new.  IMPRESSION: 1. Status post left upper lobe wedge resection, without recurrent or metastatic disease. 2. A left upper lobe tiny pulmonary nodule is unchanged. 3. Borderline to mild thoracic adenopathy, similar and likely reactive. 4. Cardiomegaly with atherosclerosis, including the coronary arteries. 5. Pulmonary artery enlargement suggests pulmonary arterial hypertension. 6. Left-sided gynecomastia. 7. New mild superior endplate compression deformity at L1.   Electronically Signed   By: Abigail Miyamoto M.D.   On: 12/25/2015 09:33     Impression:  I have personally reviewed the patient's recent CT angiogram and compared it with several previous CT scans dating back to June 2016. 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  In retrospect this is clearly not a new finding despite the fact that it has never been mentioned on previous CT scan reports. The precise dimensions of the proximal ascending thoracic aorta were not reported on the recent scan, and the reported diameter 5.1 cm is tangential and clearly not something that could be utilized to make any sort of clinical decision. The appearance of the patient's aortic root and proximal aorta is somewhat tortuous and quite  irregular, and it is very possible that the aneurysm has enlarged over time. It is also likely that the radiographic appearance and size of the aneurysm may be difficult to accurately assess using non-gated scan because of motion given the proximity to the aortic valve.  There does not appear to be any suggestion of the presence of an aortic dissection.  No other significant abnormalities are noted.   Plan:  I discussed the implications of this finding at length with the patient and his daughter in the office today. On the one hand, it is very reassuring that this aneurysm has clearly been present for many years. On the other hand, it may be enlarging in size, and under the circumstances this may put the patient at risk for aortic dissection or rupture. Given the patient's numerous comorbid medical problems surgical repair would be very high risk under any circumstances. As a next step I favor proceeding directly to cardiac gated CT angiogram to more accurately assess the anatomy and size of the aortic root and proximal ascending thoracic aorta. We will arrange for the scan to be performed as soon as possible. In the meanwhile we will check pulmonary function tests. The patient will return to our office next week once these tests have been completed. In the meanwhile, the patient and his daughter both been educated regarding symptoms and signs to be concerned about, such as acute onset of chest pain or back pain with or without acute exacerbation of shortness of breath. All of their questions have been addressed.   I spent in excess of 90 minutes during the conduct of this office consultation and >50% of this time involved direct face-to-face encounter with the patient for counseling and/or coordination of their care.    Valentina Gu. Roxy Manns, MD 08/26/2016 11:41 AM

## 2016-08-26 NOTE — Patient Instructions (Addendum)
Continue all previous medications without any changes at this time  Schedule cardiac gated CT angiogram ASAP  Call or go directly to the emergency room ASAP if you develop sudden onset severe chest pain with or without worsening of shortness of breath

## 2016-08-29 ENCOUNTER — Encounter (HOSPITAL_COMMUNITY): Payer: Self-pay

## 2016-08-29 ENCOUNTER — Ambulatory Visit (HOSPITAL_COMMUNITY)
Admission: RE | Admit: 2016-08-29 | Discharge: 2016-08-29 | Disposition: A | Discharge status: Home / Self Care | Payer: Medicare Other | Source: Ambulatory Visit | Attending: Thoracic Surgery (Cardiothoracic Vascular Surgery) | Admitting: Thoracic Surgery (Cardiothoracic Vascular Surgery)

## 2016-08-29 DIAGNOSIS — I712 Thoracic aortic aneurysm, without rupture, unspecified: Secondary | ICD-10-CM

## 2016-08-29 DIAGNOSIS — I2510 Atherosclerotic heart disease of native coronary artery without angina pectoris: Secondary | ICD-10-CM | POA: Insufficient documentation

## 2016-08-29 DIAGNOSIS — I350 Nonrheumatic aortic (valve) stenosis: Secondary | ICD-10-CM | POA: Diagnosis not present

## 2016-08-29 DIAGNOSIS — I509 Heart failure, unspecified: Secondary | ICD-10-CM | POA: Diagnosis present

## 2016-08-29 DIAGNOSIS — I517 Cardiomegaly: Secondary | ICD-10-CM | POA: Diagnosis not present

## 2016-08-29 DIAGNOSIS — I4891 Unspecified atrial fibrillation: Secondary | ICD-10-CM | POA: Diagnosis not present

## 2016-08-29 DIAGNOSIS — J449 Chronic obstructive pulmonary disease, unspecified: Secondary | ICD-10-CM | POA: Insufficient documentation

## 2016-08-29 DIAGNOSIS — J984 Other disorders of lung: Secondary | ICD-10-CM | POA: Diagnosis not present

## 2016-08-29 DIAGNOSIS — I253 Aneurysm of heart: Secondary | ICD-10-CM | POA: Insufficient documentation

## 2016-08-29 LAB — PULMONARY FUNCTION TEST
DL/VA % pred: 40 %
DL/VA: 1.87 ml/min/mmHg/L
DLCO UNC: 8.04 ml/min/mmHg
DLCO unc % pred: 23 %
FEF 25-75 Post: 1.28 L/sec
FEF 25-75 Pre: 0.88 L/sec
FEF2575-%Change-Post: 45 %
FEF2575-%PRED-POST: 48 %
FEF2575-%Pred-Pre: 33 %
FEV1-%CHANGE-POST: 12 %
FEV1-%PRED-PRE: 52 %
FEV1-%Pred-Post: 58 %
FEV1-POST: 2 L
FEV1-Pre: 1.78 L
FEV1FVC-%Change-Post: 6 %
FEV1FVC-%Pred-Pre: 80 %
FEV6-%Change-Post: 6 %
FEV6-%PRED-POST: 71 %
FEV6-%PRED-PRE: 66 %
FEV6-POST: 3.11 L
FEV6-PRE: 2.92 L
FEV6FVC-%CHANGE-POST: 1 %
FEV6FVC-%PRED-POST: 104 %
FEV6FVC-%PRED-PRE: 103 %
FVC-%Change-Post: 5 %
FVC-%Pred-Post: 67 %
FVC-%Pred-Pre: 64 %
FVC-Post: 3.14 L
FVC-Pre: 2.98 L
POST FEV1/FVC RATIO: 64 %
PRE FEV6/FVC RATIO: 98 %
Post FEV6/FVC ratio: 99 %
Pre FEV1/FVC ratio: 60 %
RV % PRED: 168 %
RV: 4.2 L
TLC % pred: 99 %
TLC: 7.21 L

## 2016-08-29 IMAGING — CT CT HEART MORP W/ CTA COR W/ SCORE W/ CA W/CM &/OR W/O CM
1 of 10 series · 1 of 20 positions shown, 2 images · IV contrast (Iodine)
Comparison: Chest CT 08/11/2016.

CLINICAL DATA: 70-year-old male with h/o mitral valve replacement,
PM placement and aortic root dilatation.

EXAM:
Cardiac TAVR CT
TECHNIQUE: The patient was scanned on a Philips 256 scanner. A 120 kV
retrospective scan was triggered in the descending thoracic aorta at
111 HU's. Gantry rotation speed was 270 msecs and collimation was .9
mm. 10 mg of iv Metoprolol and 0.8 mg of sl NTG were given. The 3D
data set was reconstructed in 5% intervals of the R-R cycle.
Systolic and diastolic phases were analyzed on a dedicated work
station using MPR, MIP and VRT modes. The patient received 80 cc of
contrast.

[Series 300: locator · axial · 0.35mm/px · z∈[-4,-4]mm · 1 of 1 slices shown, 2 images]
[im 1/1  vessel]
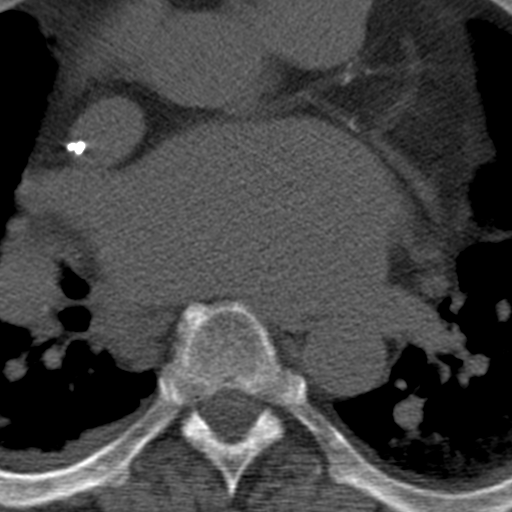
[im 1/1  lung]
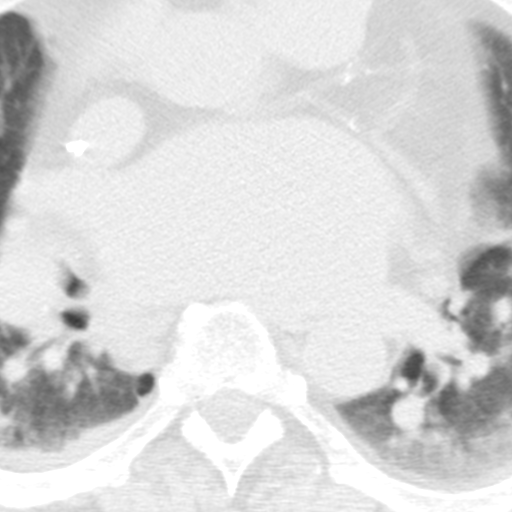

[1 of 20 positions shown; findings below may reference images not displayed]

FINDINGS: Aortic Valve: Aortic root has a pseudoaneurysm of the right coronary
cusp measuring 36 x 32 mm. As a result the right coronary cusp is
severely dilated measuring 61 mm in diameter. There is a septum that
is partially dividing a right coronary cusp in the middle. RCA
originates above the pseudoaneurysm.

Sinus of Valsalva Measurements:

Non-coronary:  48 mm

Right-coronary:  61 mm

Left-coronary:  62 mm

Aorta: Aortic root is dilated as described above. Sinotubular
junction, ascending aorta, aortic arch and descending thoracic aorta
have normal size. There is no dissection. Mild diffuse
calcifications.

Sinotubular Junction:  35 x 33 mm

Ascending Thoracic Aorta:  35 x 34 mm

Aortic Arch:  32 x 31 mm

Descending Thoracic Aorta:  26 x 25 mm

Coronary Arteries: Normal origin. Calcium score of 837 that
represents 83 percentile for age/sex.

Other findings:

Mitral valve prosthesis sits well in the mitral position.

Left atrium is massively dilated.

There is normal pulmonary vein drainage into the left atrium.

Left atrial appendage is large with no evidence of a thrombus.

Myocardium of the left ventricular apex is thinned and mildly
aneurysmal with no evidence of a thrombus.

Pacemaker leads are seen in the right atrium and right ventricle.
IMPRESSION: 1. Aortic root has a pseudoaneurysm of the right coronary cusp
measuring 36 x 32 mm. As a result the right coronary cusp is
severely dilated measuring 61 mm in diameter. There is a septum that
is partially dividing a right coronary cusp in the middle. This most
probably represents a result of a prior infection. There is no
communication (fistula) seen with the adjacent tissues.

2. Sinotubular junction, ascending aorta, aortic arch and descending
thoracic aorta have normal size. There is no dissection.

3. Mitral valve prosthesis sits well in the mitral position. Left
atrium is massively dilated.

4. Myocardium of the left ventricular apex is thinned and mildly
aneurysmal with no evidence of a thrombus.

5. Heavy calcifications of all three coronary arteries with calcium
score of 837 that represents 83 percentile for age/sex.

Adenyo Ologo

EXAM:
OVER-READ INTERPRETATION  CT CHEST

The following report is an over-read performed by radiologist Dr.
over-read does not include interpretation of cardiac or coronary
anatomy or pathology. The coronary calcium score/coronary CTA
interpretation by the cardiologist is attached.
FINDINGS: Suture line in the left upper lobe, presumably from prior wedge
resection. Diffuse bronchial wall thickening with moderate
centrilobular and paraseptal thickening of the superior aspect of
the right major fissure emphysema. , new compared to the prior
examination, likely a reflection of the small right-sided pleural
effusion on today's examination which otherwise lies dependently. No
acute consolidative airspace disease within the visualized portions
of the thorax. No definite suspicious appearing pulmonary nodules in
the visualized thorax. Aortic atherosclerosis. Innumerable
borderline enlarged mediastinal and hilar lymph nodes appear similar
to the prior study and appear to be chronic and reactive. Visualized
portions of the upper abdomen are unremarkable. There are no
aggressive appearing lytic or blastic lesions noted in the
visualized portions of the skeleton.
IMPRESSION: 1. Aortic atherosclerosis.
2. Diffuse bronchial wall thickening with moderate centrilobular and
paraseptal emphysema; imaging findings suggestive of underlying
COPD.
3. Increasing small right pleural effusion lying dependently.
4. Additional incidental findings, similar to prior examinations, as
above.

## 2016-08-29 MED ORDER — METOPROLOL TARTRATE 5 MG/5ML IV SOLN
5.0000 mg | INTRAVENOUS | Status: DC | PRN
  Administered 2016-08-29 (×3): 5 mg via INTRAVENOUS

## 2016-08-29 MED ORDER — ALBUTEROL SULFATE (2.5 MG/3ML) 0.083% IN NEBU
2.5000 mg | INHALATION_SOLUTION | Freq: Once | RESPIRATORY_TRACT | Status: AC
  Administered 2016-08-29: 2.5 mg via RESPIRATORY_TRACT

## 2016-08-29 MED ORDER — NITROGLYCERIN 0.4 MG SL SUBL
0.8000 mg | SUBLINGUAL_TABLET | Freq: Once | SUBLINGUAL | Status: AC
  Administered 2016-08-29: 0.8 mg via SUBLINGUAL
  Filled 2016-08-29: qty 25

## 2016-08-29 MED ORDER — IOPAMIDOL (ISOVUE-370) INJECTION 76%
INTRAVENOUS | Status: AC
  Administered 2016-08-29: 100 mL
  Filled 2016-08-29: qty 100

## 2016-08-29 MED ORDER — METOPROLOL TARTRATE 5 MG/5ML IV SOLN
INTRAVENOUS | Status: AC
  Administered 2016-08-29: 5 mg via INTRAVENOUS
  Filled 2016-08-29: qty 5

## 2016-08-29 MED ORDER — NITROGLYCERIN 0.4 MG SL SUBL
SUBLINGUAL_TABLET | SUBLINGUAL | Status: AC
  Administered 2016-08-29: 0.8 mg via SUBLINGUAL
  Filled 2016-08-29: qty 1

## 2016-08-29 MED ORDER — METOPROLOL TARTRATE 5 MG/5ML IV SOLN
INTRAVENOUS | Status: AC
  Administered 2016-08-29: 5 mg via INTRAVENOUS
  Filled 2016-08-29: qty 10

## 2016-08-29 NOTE — Progress Notes (Signed)
Patient signed outpatient metformin instruction sheet. States his Doctors office told patient to restart his Metformin on Thursday.

## 2016-08-29 NOTE — Progress Notes (Signed)
Patient returned from [place]. Denies any complaints or shortness of breath. States on 2L [place] pulse oximetry range from 90%-95%. Patient drinking and eating crackers without incident.

## 2016-08-31 ENCOUNTER — Ambulatory Visit (INDEPENDENT_AMBULATORY_CARE_PROVIDER_SITE_OTHER): Payer: Medicare Other | Admitting: *Deleted

## 2016-08-31 DIAGNOSIS — I428 Other cardiomyopathies: Secondary | ICD-10-CM

## 2016-08-31 NOTE — Progress Notes (Signed)
Remote ICD transmission.   

## 2016-09-01 ENCOUNTER — Encounter: Payer: Self-pay | Admitting: Cardiology

## 2016-09-01 ENCOUNTER — Encounter: Payer: Medicare Other | Admitting: Thoracic Surgery (Cardiothoracic Vascular Surgery)

## 2016-09-01 LAB — CUP PACEART REMOTE DEVICE CHECK
Battery Remaining Longevity: 46 mo
Battery Remaining Percentage: 50 %
Date Time Interrogation Session: 20180221090020
HighPow Impedance: 61 Ohm
HighPow Impedance: 61 Ohm
Implantable Lead Implant Date: 20100113
Implantable Lead Location: 753860
Implantable Lead Model: 7122
Implantable Pulse Generator Implant Date: 20130729
Lead Channel Impedance Value: 390 Ohm
Lead Channel Impedance Value: 860 Ohm
Lead Channel Pacing Threshold Amplitude: 1 V
Lead Channel Pacing Threshold Pulse Width: 0.5 ms
Lead Channel Setting Pacing Amplitude: 2 V
Lead Channel Setting Pacing Pulse Width: 0.5 ms
MDC IDC LEAD IMPLANT DT: 20100113
MDC IDC LEAD LOCATION: 753858
MDC IDC MSMT BATTERY VOLTAGE: 2.93 V
MDC IDC MSMT LEADCHNL LV PACING THRESHOLD PULSEWIDTH: 0.5 ms
MDC IDC MSMT LEADCHNL RV PACING THRESHOLD AMPLITUDE: 0.625 V
MDC IDC MSMT LEADCHNL RV SENSING INTR AMPL: 12 mV
MDC IDC SET LEADCHNL LV PACING PULSEWIDTH: 0.5 ms
MDC IDC SET LEADCHNL RV PACING AMPLITUDE: 2 V
MDC IDC SET LEADCHNL RV SENSING SENSITIVITY: 0.5 mV
Pulse Gen Serial Number: 7053988

## 2016-09-02 ENCOUNTER — Ambulatory Visit (INDEPENDENT_AMBULATORY_CARE_PROVIDER_SITE_OTHER): Payer: Medicare Other | Admitting: Thoracic Surgery (Cardiothoracic Vascular Surgery)

## 2016-09-02 ENCOUNTER — Encounter: Payer: Self-pay | Admitting: Thoracic Surgery (Cardiothoracic Vascular Surgery)

## 2016-09-02 VITALS — BP 107/67 | HR 73 | Resp 16 | Ht 73.0 in | Wt 199.0 lb

## 2016-09-02 DIAGNOSIS — Q2543 Congenital aneurysm of aorta: Secondary | ICD-10-CM | POA: Diagnosis not present

## 2016-09-02 DIAGNOSIS — Z953 Presence of xenogenic heart valve: Secondary | ICD-10-CM

## 2016-09-02 DIAGNOSIS — Q2549 Other congenital malformations of aorta: Secondary | ICD-10-CM

## 2016-09-02 NOTE — Patient Instructions (Signed)
Continue all previous medications without any changes at this time  

## 2016-09-02 NOTE — Progress Notes (Addendum)
[facility]       [place] [address]             [phone]     CARDIOTHORACIC SURGERY OFFICE NOTE  Referring Provider is [name], PA-C  Primary Cardiologist is [name], MD Primary Electrophysiologist is [name], MD Primary Pulmonologist is [name], MD Primary Oncologist is [name], MD PCP is [name], NP  HPI:  Patient returns to the office today for follow-up of sinus of Valsalva aneurysm.  He was seen in consultation last week and sent for cardiac gated CT angiogram of the heart and pulmonary function tests. He returns to the office today to review the results of these tests and discuss treatment options. His wife and daughter are with him for consultation this afternoon. He reports no new problems or complaints. He specifically denies any chest pain or shortness of breath that is different from his baseline chronic shortness of breath.   Current Outpatient Prescriptions  Medication Sig Dispense Refill  . aspirin EC 81 MG tablet Take 1 tablet (81 mg total) by mouth daily. 90 tablet 3  . atorvastatin (LIPITOR) 10 MG tablet TAKE 1 TABLET BY MOUTH EVERY DAY 90 tablet 2  . budesonide-formoterol (SYMBICORT) 160-4.5 MCG/ACT inhaler INHALE 2 PUFFS INTO THE LUNGS TWICE A DAY 10.2 Inhaler 5  . carvedilol (COREG) 3.125 MG tablet TAKE 1 TABLET BY MOUTH TWICE A DAY WITH MEALS 180 tablet 1  . furosemide (LASIX) 40 MG tablet Take 40 mg by mouth daily. May take 1 extra daily as needed for edema    . irbesartan (AVAPRO) 75 MG tablet TAKE 1 TABLET BY MOUTH EVERY DAY 90 tablet 1  . metFORMIN (GLUCOPHAGE) 500 MG tablet 1,000 mg 2 (two) times daily with a meal. TAKE 1 (ONE) TABLET BY MOUTH IN THE MORNING WITH BREAKFAST AND 1/2 TABLET BY MOUTH WITH EVENING MEAL     . nitroGLYCERIN (NITROSTAT) 0.4 MG SL tablet Place 0.4 mg under the tongue every 5 (five) minutes as needed for chest pain (x 3 doses). Reported on 12/29/2015     No current  facility-administered medications for this visit.       Physical Exam:   BP 107/67   Pulse 73   Resp 16   Ht '6\' 1"'$  (1.854 m)   Wt 199 lb (90.3 kg)   SpO2 94% Comment: ON 2L 02  BMI 26.25 kg/m   General:  Well-appearing  Chest:   Clear to auscultation  CV:   Regular rate and rhythm without murmur  Incisions:  n/a  Abdomen:  Soft nontender  Extremities:  Warm and well-perfused  Diagnostic Tests:  Cardiac TAVR CT  TECHNIQUE: The patient was scanned on a Philips 256 scanner. A 120 kV retrospective scan was triggered in the descending thoracic aorta at 111 HU's. Gantry rotation speed was 270 msecs and collimation was .9 mm. 10 mg of iv Metoprolol and 0.8 mg of sl NTG were given. The 3D data set was reconstructed in 5% intervals of the R-R cycle. Systolic and diastolic phases were analyzed on a dedicated work station using MPR, MIP and VRT modes. The patient received 80 cc of contrast.  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 As a result the right coronary cusp is severely dilated measuring 61 mm in diameter. There is a septum that is partially dividing a right coronary cusp in  the middle. RCA originates above the pseudoaneurysm.  Sinus of Valsalva Measurements:  Non-coronary:  48 mm  Right-coronary:  61 mm  Left-coronary:  62 mm  Aorta: Aortic root is dilated as described above. Sinotubular junction, ascending aorta, aortic arch and descending thoracic aorta have normal size. There is no dissection. Mild diffuse calcifications.  Sinotubular Junction:  35 x 33 mm  Ascending Thoracic Aorta:  35 x 34 mm  Aortic Arch:  32 x 31 mm  Descending Thoracic Aorta:  26 x 25 mm  Coronary Arteries: Normal origin. Calcium score of 837 that represents 83 percentile for age/sex.  Other findings:  Mitral valve prosthesis sits well in the mitral position.  Left atrium is massively  dilated.  There is normal pulmonary vein drainage into the left atrium.  Left atrial appendage is large with no evidence of a thrombus.  Myocardium of the left ventricular apex is thinned and mildly aneurysmal with no evidence of a thrombus.  Pacemaker leads are seen in the right atrium and right ventricle.  IMPRESSION: 1. Aortic root has a pseudoaneurysm of the right coronary cusp measuring 36 x 32 mm. As a result the right coronary cusp is severely dilated measuring 61 mm in diameter. There is a septum that is partially dividing a right coronary cusp in the middle. This most probably represents a result of a prior infection. There is no communication (fistula) seen with the adjacent tissues.  2. Sinotubular junction, ascending aorta, aortic arch and descending thoracic aorta have normal size. There is no dissection.  3. Mitral valve prosthesis sits well in the mitral position. Left atrium is massively dilated.  4. Myocardium of the left ventricular apex is thinned and mildly aneurysmal with no evidence of a thrombus.  5. Heavy calcifications of all three coronary arteries with calcium score of 837 that represents 83 percentile for age/sex.  Ena Dawley   Electronically Signed   By: Ena Dawley   On: 08/30/2016 15:06   Pulmonary Function Tests  Baseline      Post-bronchodilator  FVC  2.98 L  (64% predicted) FVC  3.14 L  (67% predicted) FEV1  1.78 L  (52% predicted) FEV1  2.00 L  (58% predicted) FEF25-75 0.88 L  (33% predicted) FEF25-75 1.28 L  (48% predicted)  TLC  7.21 L  (99% predicted) RV  4.20 L  (168% predicted) DLCO  23% predicted    Impression:  I have personally reviewed the patient's cardiac gated CT angiogram of the heart and pulmonary function tests.  CT angiogram confirms the presence of a large aneurysm involving the right sinus of Valsalva. There is a linear septation within the aneurysm suggesting that this is either a false  aneurysm or a localized chronic dissection, although in some images this structure appears to be contiguous with the right cusp of the aortic valve.  If this is a false aneurysm or contained localized dissection in the aortic root it seems likely that it may be related to the patient's previous history of endocarditis and/or his previous mitral valve replacement.  However, the chronicity is not clear.  Unfortunately, he did not have a CT scan of the chest performed at the time of his original presentation with endocarditis in 2009 nor around the time of his mitral valve replacement in early 2010.  Based upon review of several previous non-gated CT scans of the chest it is quite clear that this aneurysm has been present since 2015, although it may be getting slightly larger  in size. The patient does not have signs of worsening congestive heart failure and recent transthoracic echocardiogram revealed no signs of any aortic insufficiency or mitral regurgitation.  Surgical repair would require aortic root replacement, possibly valve sparing. Given the fact that the patient has had previous mitral valve replacement, has chronic nonischemic cardiomyopathy, and severe COPD for which the patient is oxygen dependent, risks associated with surgery would unquestionably be extremely high. The patient would be at particularly very high risk for the development of ventilator-dependent respiratory failure postoperatively. Long-term prognosis without surgery is difficult to predict. There is no question that the patient remains at significant risk for sudden death due to the development of aortic dissection, aortic rupture, aortic insufficiency, and/or myocardial infarction.  Elective surgical intervention would not be associated with any type of symptomatic improvement nor improvement in the patient's underlying quality of life. Rather, elective surgery would be with the sole intention of prolonging the patient's life and  decreasing the risks of acute decompensation and death. Given the patient's chronic medical problems I would not consider this patient a candidate for emergent surgical intervention if he were to present acutely in decompensated heart failure or shock.    Plan:  I have discussed the nature of this problem at length with the patient, his wife, and his daughter in the office today. We directly reviewed images from his cardiac gated CT angiogram. We discussed the potential life-threatening nature of this problem and the numerous issues complicating our management decisions. We discussed various approaches including continued observation with no immediate plans for elective surgical intervention versus proceeding with further diagnostic testing to consider high risk elective repair. The patient seems inclined to be interested in possible elective repair if he is felt to be a candidate, although he and his family wish to discuss matters further. As a next step he will need transesophageal echocardiogram to further characterize whether or not this is a false aneurysm, localized dissection, or simply a sinus of Valsalva aneurysm.  We will also need to carefully examine the aortic and mitral valves.  We will review the cardiac gated CT angiogram to see whether or not we could potentially proceed with surgery without coronary arteriography if the patient is felt to be at very low risk for significant proximal coronary artery disease based on his CTA.  Because of the high calcium score in his coronary arteries he may need diagnostic catheterization.  Right heart catheterization may be helpful.  We will follow-up with Dr. Elsworth Soho from the pulmonary critical care team to get his input regarding the patient's risks related to his underlying chronic lung disease. Finally, the patient will undergo formal physical therapy evaluation to get a better assessment of his current functional status, strength, and frailty. The patient  will return to our office for follow-up next week or as soon as possible once these steps been completed. During the interim period time the patient will discuss matters at length with his family. All of his questions been addressed.    I spent in excess of 30 minutes during the conduct of this office consultation and >50% of this time involved direct face-to-face encounter with the patient for counseling and/or coordination of their care.   Valentina Gu. Roxy Manns, MD 09/02/2016 2:53 PM    Addendum:  I have discussed the patient's circumstances at length over the telephone with Dr. Elsworth Soho this afternoon.  He noted that although the patient's forced vital capacity and FEV1 are not severely reduced, the patient has  very low diffusion capacity which explains his long-term oxygen dependency. The patient would unquestionably be at very high risk for respiratory failure following any type of elective surgery, but his respiratory mechanics are not by themselves necessarily prohibitive. He agrees with plans to proceed with formal physical therapy assessment and frailty examination to get a better feel for the patient's potential ability to withstand another major operation.  Because the patient would smoking in the remote past and does not have problems with chronic bronchospasm, there is probably no benefit to attempt further tune up from a pulmonary standpoint at this time.   [name], MD 09/02/2016 4:12 PM

## 2016-09-05 ENCOUNTER — Telehealth: Payer: Self-pay | Admitting: Physician Assistant

## 2016-09-05 NOTE — Telephone Encounter (Signed)
Mrs. Neyhart is calling to verify if patient( [name]) can take his medications tomorrow, as he is scheduled for an endoscopy. Thanks.

## 2016-09-05 NOTE — Telephone Encounter (Signed)
Patients wife called. Patient is having a TEE tomorrow at [facility].  She was told he was supposed to fast in a phone call from [facility].  She asked about his medications.  I advised that he should take them with a sip of water.

## 2016-09-06 ENCOUNTER — Ambulatory Visit (HOSPITAL_COMMUNITY)
Admission: RE | Admit: 2016-09-06 | Discharge: 2016-09-06 | Disposition: A | Discharge status: Home / Self Care | Payer: Medicare Other | Source: Ambulatory Visit | Attending: Cardiology | Admitting: Cardiology

## 2016-09-06 ENCOUNTER — Ambulatory Visit (HOSPITAL_COMMUNITY): Payer: Medicare Other | Admitting: Anesthesiology

## 2016-09-06 ENCOUNTER — Encounter (HOSPITAL_COMMUNITY): Payer: Self-pay | Admitting: *Deleted

## 2016-09-06 ENCOUNTER — Encounter (HOSPITAL_COMMUNITY)
Admission: RE | Disposition: A | Discharge status: Home / Self Care | Payer: Self-pay | Source: Ambulatory Visit | Attending: Cardiology

## 2016-09-06 ENCOUNTER — Ambulatory Visit (HOSPITAL_COMMUNITY): Payer: Medicare Other

## 2016-09-06 DIAGNOSIS — I342 Nonrheumatic mitral (valve) stenosis: Secondary | ICD-10-CM | POA: Diagnosis not present

## 2016-09-06 DIAGNOSIS — Z953 Presence of xenogenic heart valve: Secondary | ICD-10-CM | POA: Insufficient documentation

## 2016-09-06 DIAGNOSIS — E785 Hyperlipidemia, unspecified: Secondary | ICD-10-CM | POA: Diagnosis not present

## 2016-09-06 DIAGNOSIS — Z952 Presence of prosthetic heart valve: Secondary | ICD-10-CM

## 2016-09-06 DIAGNOSIS — I5022 Chronic systolic (congestive) heart failure: Secondary | ICD-10-CM | POA: Diagnosis not present

## 2016-09-06 DIAGNOSIS — I77810 Thoracic aortic ectasia: Secondary | ICD-10-CM

## 2016-09-06 DIAGNOSIS — I2541 Coronary artery aneurysm: Secondary | ICD-10-CM | POA: Diagnosis not present

## 2016-09-06 DIAGNOSIS — I110 Hypertensive heart disease with heart failure: Secondary | ICD-10-CM | POA: Diagnosis not present

## 2016-09-06 HISTORY — PX: TEE WITHOUT CARDIOVERSION: SHX5443

## 2016-09-06 LAB — GLUCOSE, CAPILLARY: Glucose-Capillary: 194 mg/dL — ABNORMAL HIGH (ref 65–99)

## 2016-09-06 SURGERY — ECHOCARDIOGRAM, TRANSESOPHAGEAL
Anesthesia: Monitor Anesthesia Care

## 2016-09-06 MED ORDER — BUTAMBEN-TETRACAINE-BENZOCAINE 2-2-14 % EX AERO
INHALATION_SPRAY | CUTANEOUS | Status: DC | PRN
Start: 2016-09-06 — End: 2016-09-06
  Administered 2016-09-06: 2 via TOPICAL

## 2016-09-06 MED ORDER — PROPOFOL 500 MG/50ML IV EMUL
INTRAVENOUS | Status: DC | PRN
  Administered 2016-09-06: 200 ug/kg/min via INTRAVENOUS

## 2016-09-06 MED ORDER — PHENYLEPHRINE 40 MCG/ML (10ML) SYRINGE FOR IV PUSH (FOR BLOOD PRESSURE SUPPORT)
PREFILLED_SYRINGE | INTRAVENOUS | Status: DC | PRN
  Administered 2016-09-06 (×5): 40 ug via INTRAVENOUS

## 2016-09-06 MED ORDER — SODIUM CHLORIDE 0.9 % IV SOLN: INTRAVENOUS | Status: DC

## 2016-09-06 MED ORDER — LIDOCAINE 2% (20 MG/ML) 5 ML SYRINGE
INTRAMUSCULAR | Status: DC | PRN
  Administered 2016-09-06: 100 mg via INTRAVENOUS

## 2016-09-06 MED ORDER — LACTATED RINGERS IV SOLN
INTRAVENOUS | Status: DC | PRN
  Administered 2016-09-06: 12:00:00 via INTRAVENOUS

## 2016-09-06 NOTE — CV Procedure (Signed)
     Transesophageal Echocardiogram Note  TRINDON DORTON [id number] 1945-12-14  Procedure: Transesophageal Echocardiogram Indications: aortic root dilatation, s/p MVR  Procedure Details Consent: Obtained Time Out: Verified patient identification, verified procedure, site/side was marked, verified correct patient position, special equipment/implants available, Radiology Safety Procedures followed,  medications/allergies/relevent history reviewed, required imaging and test results available.  Performed  Medications: Propofol 260 mg iv was administered by anesthesia staff.   - Left ventricle: Systolic function was mildly reduced. The   estimated ejection fraction was in the range of 45% to 50%. Wall   motion was normal; there were no regional wall motion   abnormalities. - Mitral valve: S/P MVR with bioprosthetic valve with 33 mm   Medtronic Mosaic porcine bioprosthesis. There is no central   mitral regurgitation or paravalvular leak. There is limited   motion of the posterior leaflet. Mean transmitral gradient is 7   mmHg.   Valve area by pressure half-time: 1.26 cm^2. - Left atrium: The atrium was massively dilated. No evidence of   thrombus in the atrial cavity or appendage but heavy smoke seen   in the left atrium. The appendage was multilobulated and large.   Emptying velocity was decreased. - Right atrium: The atrium was dilated. No evidence of thrombus in   the atrial cavity or appendage.  Impressions:  - Aortic valve leaflets are structurally normal, with normal   coaptation and no regurgitation.   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 The pseudoaneurysm measures 32 x 25   mm. The communication is seen on both 2D (image 21, 22) and 3D   images (image 26).     S/P MVR with bioprosthetic valve with 33 mm Medtronic Mosaic   porcine bioprosthesis. There is no central mitral regurgitation   or paravalvular leak.  There is limited motion of the posterior   leaflet however anterior leaflet opens very well. Mean   transmitral gradient is 7 mmHg that is most probably attributed   to prosthesis/patient mismatch. There is no significant stenosis.   Complications: No apparent complications Patient did tolerate procedure well.  Ena Dawley, MD, [facility] 09/06/2016, 1:25 PM

## 2016-09-06 NOTE — Transfer of Care (Signed)
Immediate Anesthesia Transfer of Care Note  Patient: [name]  Procedure(s) Performed: Procedure(s): TRANSESOPHAGEAL ECHOCARDIOGRAM (TEE) (N/A)  Patient Location: Endoscopy Unit  Anesthesia Type:MAC  Level of Consciousness: awake and patient cooperative  Airway & Oxygen Therapy: Patient Spontanous Breathing and Patient connected to nasal cannula oxygen  Post-op Assessment: Report given to RN, Post -op Vital signs reviewed and stable and Patient moving all extremities X 4  Post vital signs: Reviewed and stable  Last Vitals:  Vitals:   09/06/16 1116  BP: 137/73  Pulse: 70  Resp: 18  Temp: 36.9 C    Last Pain:  Vitals:   09/06/16 1116  TempSrc: Oral         Complications: No apparent anesthesia complications

## 2016-09-06 NOTE — Anesthesia Postprocedure Evaluation (Addendum)
Anesthesia Post Note  Patient: BRISON FIUMARA  Procedure(s) Performed: Procedure(s) (LRB): TRANSESOPHAGEAL ECHOCARDIOGRAM (TEE) (N/A)  Patient location during evaluation: Endoscopy Anesthesia Type: MAC Level of consciousness: awake and alert Pain management: pain level controlled Vital Signs Assessment: post-procedure vital signs reviewed and stable Respiratory status: spontaneous breathing, nonlabored ventilation, respiratory function stable and patient connected to nasal cannula oxygen Cardiovascular status: stable and blood pressure returned to baseline Anesthetic complications: no       Last Vitals:  Vitals:   09/06/16 1325 09/06/16 1335  BP: 104/65 107/64  Pulse: 70 73  Resp: 16 (!) 21  Temp:      Last Pain:  Vitals:   09/06/16 1312  TempSrc: Oral                 Elisabel Hanover,JAMES TERRILL

## 2016-09-06 NOTE — Anesthesia Preprocedure Evaluation (Addendum)
Anesthesia Evaluation  Patient identified by MRN, date of birth, ID band Patient awake    Reviewed: Allergy & Precautions, NPO status , Patient's Chart, lab work & pertinent test results  Airway Mallampati: II  TM Distance: >3 FB Neck ROM: Full    Dental  (+) Teeth Intact, Dental Advisory Given   Pulmonary COPD, former smoker,    breath sounds clear to auscultation       Cardiovascular hypertension, + Past MI, + Peripheral Vascular Disease and +CHF  + dysrhythmias + pacemaker + Cardiac Defibrillator  Rhythm:Regular Rate:Normal + Systolic murmurs    Neuro/Psych    GI/Hepatic GERD  ,(+) Cirrhosis       ,   Endo/Other  diabetes  Renal/GU Renal disease     Musculoskeletal   Abdominal   Peds  Hematology   Anesthesia Other Findings   Reproductive/Obstetrics                           Anesthesia Physical Anesthesia Plan  ASA: III  Anesthesia Plan: MAC   Post-op Pain Management:    Induction: Intravenous  Airway Management Planned: Natural Airway  Additional Equipment:   Intra-op Plan:   Post-operative Plan:   Informed Consent: I have reviewed the patients History and Physical, chart, labs and discussed the procedure including the risks, benefits and alternatives for the proposed anesthesia with the patient or authorized representative who has indicated his/her understanding and acceptance.   Dental advisory given  Plan Discussed with: Anesthesiologist  Anesthesia Plan Comments:       Anesthesia Quick Evaluation

## 2016-09-06 NOTE — Discharge Instructions (Signed)
TEE  YOU HAD AN CARDIAC PROCEDURE TODAY: Refer to the procedure report and other information in the discharge instructions given to you for any specific questions about what was found during the examination. If this information does not answer your questions, please call [facility] office at [phone] to clarify.   DIET: Your first meal following the procedure should be a light meal and then it is ok to progress to your normal diet. A half-sandwich or bowl of soup is an example of a good first meal. Heavy or fried foods are harder to digest and may make you feel nauseous or bloated. Drink plenty of fluids but you should avoid alcoholic beverages for 24 hours. If you had a esophageal dilation, please see attached instructions for diet.   ACTIVITY: Your care partner should take you home directly after the procedure. You should plan to take it easy, moving slowly for the rest of the day. You can resume normal activity the day after the procedure however YOU SHOULD NOT DRIVE, use power tools, machinery or perform tasks that involve climbing or major physical exertion for 24 hours (because of the sedation medicines used during the test).   SYMPTOMS TO REPORT IMMEDIATELY: A cardiologist can be reached at any hour. Please call [phone] for any of the following symptoms:  Vomiting of blood or coffee ground material  New, significant abdominal pain  New, significant chest pain or pain under the shoulder blades  Painful or persistently difficult swallowing  New shortness of breath  Black, tarry-looking or red, bloody stools  FOLLOW UP:  Please also call with any specific questions about appointments or follow up tests.  TEE  YOU HAD AN CARDIAC PROCEDURE TODAY: Refer to the procedure report and other information in the discharge instructions given to you for any specific questions about what was found during the examination. If this information does not answer your questions, please call [facility] office at [phone] to clarify.   DIET: Your first meal following the procedure should be a light meal and then it is ok to progress to your normal diet. A half-sandwich or bowl of soup is an example of a good first meal. Heavy or fried foods are harder to digest and may make you feel nauseous or bloated. Drink plenty of fluids but you should avoid alcoholic beverages for 24 hours. If you had a esophageal dilation, please see attached instructions for diet.   ACTIVITY: Your care partner should take you home directly after the procedure. You should plan to take it easy, moving slowly for the rest of the day. You can resume normal activity the day after the procedure however YOU SHOULD NOT DRIVE, use power tools, machinery or perform tasks that involve climbing or major physical exertion for 24 hours (because of the sedation medicines used during the test).   SYMPTOMS TO REPORT IMMEDIATELY: A cardiologist can be reached at any hour. Please call [phone] for any of the following symptoms:  Vomiting of blood or coffee ground material  New, significant abdominal pain  New, significant chest pain or pain under the shoulder blades  Painful or persistently difficult swallowing  New shortness of breath  Black, tarry-looking or red, bloody stools  FOLLOW UP:  Please also call with any specific questions about appointments or follow up tests.

## 2016-09-06 NOTE — Interval H&P Note (Signed)
History and Physical Interval Note:  09/06/2016 11:54 AM  [name]  has presented today for surgery, with the diagnosis of MITAL VALVE REPLACMENT  The various methods of treatment have been discussed with the patient and family. After consideration of risks, benefits and other options for treatment, the patient has consented to  Procedure(s): TRANSESOPHAGEAL ECHOCARDIOGRAM (TEE) (N/A) as a surgical intervention .  The patient's history has been reviewed, patient examined, no change in status, stable for surgery.  I have reviewed the patient's chart and labs.  Questions were answered to the patient's satisfaction.     Ena Dawley

## 2016-09-07 ENCOUNTER — Ambulatory Visit: Payer: Medicare Other | Attending: Thoracic Surgery (Cardiothoracic Vascular Surgery) | Admitting: Physical Therapy

## 2016-09-07 ENCOUNTER — Encounter: Payer: Self-pay | Admitting: Physical Therapy

## 2016-09-07 DIAGNOSIS — R2689 Other abnormalities of gait and mobility: Secondary | ICD-10-CM

## 2016-09-07 DIAGNOSIS — M6281 Muscle weakness (generalized): Secondary | ICD-10-CM | POA: Diagnosis not present

## 2016-09-07 DIAGNOSIS — R293 Abnormal posture: Secondary | ICD-10-CM

## 2016-09-07 NOTE — Therapy (Signed)
[place], [place], [address] Phone: [phone]   Fax:  [phone]  Physical Therapy Evaluation  Patient Details  Name: [name] MRN: [id number] Date of Birth: 1946-05-16 Referring Provider: Dr. Darylene Price   Encounter Date: 09/07/2016      PT End of Session - 09/07/16 1502    Visit Number 1   PT Start Time 5465   PT Stop Time 1435   PT Time Calculation (min) 40 min      Past Medical History:  Diagnosis Date  . Atrial fibrillation ([place])    AV Node ablation January, 2010, for rapid atrial fib  . Atrial septal defect    Closed with surgery January, 2010  . Automatic implantable cardioverter-defibrillator in situ    LV dysfunction and pacer needed for AV node lesion  . Cardiomyopathy    non-ischemic  . CHF (congestive heart failure) ([place])   . Chronic combined systolic and diastolic CHF (congestive heart failure) ([place])   . Colon polyps   . COPD (chronic obstructive pulmonary disease) (HCC)    O2- 2 liters, nasal cannula, q night   . COPD GOLD II 01/11/2007   PFT's 09/25/13  FEV1  2.18 (61%) ratio 64 no change p B2 and DLC0  32% corrects to 68% - trial off advair and acei rec starting  08/23/2013     . CVA (cerebral vascular accident) ([place]) 2009   denies residual on 08/14/2013  . Diabetes mellitus without complication ([place])   . Dyslipidemia   . Dysrhythmia   . Ejection fraction < 50%   . Endocarditis    Bacterial, 2009  . Headache(784.0)    related to stroke only  . HLD (hyperlipidemia)   . Hypertension   . Intracranial hemorrhage (HCC)    Coumadin cannot be used because of the history of his bleed  . Lung cancer ([place]) 11/29/2013   T1N0 Stage Ia non-small cell carcinoma left lung treated with wedge resection  . Mitral regurgitation    Severe symptomatic primary MR due to bacterial endocarditis, treated w/ MVR // Echo 1/18 EF 40-45, diffuse HK, dilated aorta at 42 mm/aortic root 47 mm;  linear echogenic structure in ascending aorta - suspect reverberation artifact / consider CT or TEE to rule out dissection flap, bioprosthetic MVR with mean gradient 3, severe LAE, low normal RVSF, severe RAE, moderate TR, PASP 42  . Myocardial infarction 2010  . Pacemaker   . Permanent atrial fibrillation    Originally Coumadin use for atrial fibrillation  //   he had intracerebral hemorrhage with an INR of 2.3 June, 2009. Anticoagulation could no longer be used.  //  Rapid atrial fibrillation after inferior MI October, 2010..........Marland Kitchen AV node ablation done at that time with ICD pacemaker placed (EF 35%).   //   Left atrial appendage tied off at the time of mitral valve surgery January, 2010 (maze pro  . Pneumonia    "this is my first case" (08/14/2013)  . Prosthetic valve dysfunction    Mild mitral stenosis  . Pulmonary hypertension    Moderate  . Renal artery stenosis (HCC)    Mild by history  . 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 Spontaneous pneumothorax    right thoracotomy - distant past  . Status post minimally invasive mitral valve replacement with bioprosthetic valve    33 mm Medtronic Mosaic porcine bioprosthesis placed via right mini thoracotomy for bacterial endocarditis complicated by severe MR and CHF   .  Thoracic aortic aneurysm ([place]) 08/11/2016   a - Chest CTA 1/18:  Aneurysmal dilatation of aortic root is noted at 5.1 cm.     Past Surgical History:  Procedure Laterality Date  . APPENDECTOMY    . ASD REPAIR, SECUNDUM  07/17/2008   pericardial patch closure of ASD  . AV NODE ABLATION  07/2008   for rapid atrial fib  . CARDIAC CATHETERIZATION    . CARDIAC DEFIBRILLATOR PLACEMENT  ~ [address] [facility]  . CARDIAC VALVE REPLACEMENT    . CATARACT EXTRACTION W/ INTRAOCULAR LENS  IMPLANT, BILATERAL Bilateral   . IMPLANTABLE CARDIOVERTER DEFIBRILLATOR (ICD) GENERATOR CHANGE N/A 02/06/2012   Procedure: ICD GENERATOR CHANGE;  Surgeon: [name], MD;  Location: [facility] CATH LAB;   Service: Cardiovascular;  Laterality: N/A;  . INSERT / REPLACE / REMOVE PACEMAKER    . LEEP    . MASS BIOPSY Left    neck mass  . MITRAL VALVE REPLACEMENT Right 07/17/2008   35m Medtronic Mosaic porcine bioprosthesis  . PENILE PROSTHESIS IMPLANT    . RIGHT HEART CATHETERIZATION N/A 08/16/2013   Procedure: RIGHT HEART CATH;  Surgeon: PJosue Hector MD;  [facility];  Service: Cardiovascular;  Laterality: N/A;  . THORACOTOMY Right 1970's   spontaneous pneumothorax - while in the [employer] . TONSILLECTOMY    . VIDEO ASSISTED THORACOSCOPY (VATS)/WEDGE RESECTION Left 11/29/2013   Procedure: Video assisted thoracoscopy for wedge resection; mini thoracotomy;  Surgeon: CRexene Alberts MD;  [facility]  Service: Thoracic;  Laterality: Left;    There were no vitals filed for this visit.       Subjective Assessment - 09/07/16 1359    Subjective Pt reports no real changes in function recently. MD wanted him to have physical therapy evaluation prior to surgical intervention for mitral valve replacement.   Currently in Pain? No/denies            [facility] - 09/07/16 0001      Assessment   Medical Diagnosis sinus of valsalve aneurysym   Referring Provider Dr. CDarylene Price   Onset Date/Surgical Date 09/02/16     Precautions   Precautions None     Restrictions   Weight Bearing Restrictions No     Balance Screen   Has the patient fallen in the past 6 months No   Has the patient had a decrease in activity level because of a fear of falling?  No   Is the patient reluctant to leave their home because of a fear of falling?  No     Home Environment   Living Environment Private residence   Living Arrangements Spouse/significant other   Home Access Stairs to enter   Entrance Stairs-Number of Steps 2   Entrance Stairs-Rails Left;Right;Can reach both   HSpiroOne level     Prior Function   Level of Independence Independent with household mobility without  device;Independent with community mobility without device     Observation/Other Assessments   Observations 2 L continuous supplemental oxygen     Posture/Postural Control   Posture/Postural Control Postural limitations   Postural Limitations Forward head;Rounded Shoulders  mild     ROM / Strength   AROM / PROM / Strength AROM;Strength     AROM   Overall AROM Comments grossly WNL, R ankle DF limited compared to R     Strength   Overall Strength Comments grossly 4/5 throughout   Strength Assessment Site Hand  Right/Left hand Right;Left   Right Hand 3 Point Pinch 61 lbs   Left Hand Grip (lbs) 66     Ambulation/Gait   Gait Comments Pt ambulated independently pulling portable oxygen behind him. Pt limited in gait distance due to oxygen desaturation despite supplemental oxygen.           [facility] Pre-Surgical Assessment - 09/07/16 0001    5 Meter Walk Test- trial 1 6 sec   5 Meter Walk Test- trial 2 6 sec.    5 Meter Walk Test- trial 3 6 sec.   5 meter walk test average 6 sec   4 Stage Balance Test tolerated for:  3 sec.   4 Stage Balance Test Position 3   Sit To Stand Test- trial 1 16 sec.   ADL/IADL Independent with: Bathing;Dressing;Meal prep;Finances   ADL/IADL Needs Assistance with: Yard work   6 Minute Walk- Baseline yes   BP (mmHg) 114/66   HR (bpm) 73   02 Sat (%RA) 92 %   Modified Borg Scale for Dyspnea 0- Nothing at all   Perceived Rate of Exertion (Borg) 6-   6 Minute Walk Post Test yes   BP (mmHg) 117/66   HR (bpm) 87   02 Sat (%RA) 87 %  decr to 79% immediately after; took 3 min to recover to 90%   Modified Borg Scale for Dyspnea 0- Nothing at all   Perceived Rate of Exertion (Borg) 9- very light   Aerobic Endurance Distance Walked 370   Endurance additional comments Pt had to rest due to oxygen desaturation to 87%. at the 2:40 minute mark. It took >3 minutes to return to 90% so unable to resume 6 minute walk. Pt did not report any shortness or breath nor  fatigue. Pt reports the distance walked during the test was about the farthest he would walk at any one time during his typical daily activities.                          PT Education - 09/07/16 1501    Education provided Yes   Education Details fall risk, oxygen desaturation with walking   Person(s) Educated Patient   Methods Explanation   Comprehension Verbalized understanding                    Plan - 09/07/16 1502    Clinical Impression Statement see below   PT Frequency One time visit   Consulted and Agree with Plan of Care Patient     Clinical Impression Statement: Pt is a 71 yo male presenting to OP PT for evaluation prior to mitral valve replacement surgery due to sinus of valsalva aneurysm. Pt reports he is asymptomatic. Pt presents with good ROM with mild limitation in Right ankle DF, mild weakness throughout, fair balance and is at high fall risk 4 stage balance test, good walking speed and poor aerobic endurance per 6 minute walk test. Pt ambulated a total of 370' feet in 6 minute walk, all in the first 2:40. He then needed to rest due to oxygen decreasing to 87% with 2L supplemental O2. Upon sitting, it dropped to 79% and took > 3 minutes to recover to 90%. Based on the Short Physical Performance Battery, patient has a frailty rating of 9/12 with </= 5/12 considered frail.   Patient demonstrated the following deficits and impairments:     Visit Diagnosis: Other abnormalities of gait and mobility  Abnormal posture  Muscle weakness (generalized)      G-Codes - 10-Sep-2016 1503    Functional Assessment Tool Used (Outpatient Only) 6 minute walk 370'   Functional Limitation Mobility: Walking and moving around   Mobility: Walking and Moving Around Current Status [phone]) At least 80 percent but less than 100 percent impaired, limited or restricted   Mobility: Walking and Moving Around Goal Status [phone]) At least 80 percent but less than 100 percent  impaired, limited or restricted   Mobility: Walking and Moving Around Discharge Status [phone]) At least 80 percent but less than 100 percent impaired, limited or restricted       Problem List Patient Active Problem List   Diagnosis Date Noted  . Aortic root dilatation ([place])   . 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 Thoracic aortic aneurysm ([place]) 08/11/2016  . Atherosclerosis of aorta ([place]) 08/11/2016  . Orthostatic hypotension 09/26/2015  . Near syncope 09/24/2015  . Syncope 09/24/2015  . Hepatic cirrhosis ([place]) 09/25/2014  . History of colonic polyps 09/25/2014  . Serum total bilirubin elevated 06/30/2014  . Follow-up examination, following unspecified surgery 01/27/2014  . Lung cancer ([place]) 11/29/2013  . Prosthetic valve dysfunction   . Chronic combined systolic and diastolic CHF (congestive heart failure) ([place])   . Diabetes ([place]) 11/04/2013  . Myocardial infarction 11/01/2013  . Cardiomyopathy, nonischemic ([place]) 11/01/2013  . H/O intracranial hemorrhage 08/13/2013  . H/O endocarditis 08/13/2013  . H/O: CVA (cerebrovascular accident) 08/13/2013  . Chronic respiratory failure assoc with chf/ PAH 08/12/2013  . H/O atrioventricular nodal ablation   . S/P MVR (mitral valve replacement)   . Chronic systolic CHF (congestive heart failure) ([place]) 01/08/2012  . Hypertension   . Dyslipidemia   . Permanent atrial fibrillation   . Ejection fraction < 50%   . ICD (implantable cardioverter-defibrillator), biventricular, in situ   . Renal artery stenosis ([place])   . Pulmonary hypertension ([place])   . Primary cancer of left upper lobe of lung ([place]) 04/20/2009  . COLONIC POLYPS, ADENOMATOUS 03/22/2007  . COPD GOLD II 01/11/2007  . GERD 01/11/2007    Roann, PT 09-10-16, 3:04 PM  [facility] [address] [place], [place], [address] Phone: [phone]   Fax:  [phone]  Name: [name] MRN: [id number] Date of  Birth: 03/01/1946

## 2016-09-12 ENCOUNTER — Ambulatory Visit (INDEPENDENT_AMBULATORY_CARE_PROVIDER_SITE_OTHER): Payer: Medicare Other | Admitting: Thoracic Surgery (Cardiothoracic Vascular Surgery)

## 2016-09-12 ENCOUNTER — Encounter: Payer: Self-pay | Admitting: Thoracic Surgery (Cardiothoracic Vascular Surgery)

## 2016-09-12 VITALS — BP 117/71 | HR 85 | Resp 16 | Ht 73.0 in | Wt 199.0 lb

## 2016-09-12 DIAGNOSIS — Z953 Presence of xenogenic heart valve: Secondary | ICD-10-CM | POA: Diagnosis not present

## 2016-09-12 DIAGNOSIS — Q2549 Other congenital malformations of aorta: Secondary | ICD-10-CM

## 2016-09-12 DIAGNOSIS — Q2543 Congenital aneurysm of aorta: Secondary | ICD-10-CM | POA: Diagnosis not present

## 2016-09-12 NOTE — Patient Instructions (Signed)
Continue all previous medications without any changes at this time  Call EMS or go to Emergency Department if you develop severe chest pain and/or shortness of breath that persists and will not improve with use of rescue inhaler

## 2016-09-12 NOTE — Progress Notes (Signed)
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  He was originally seen in consultation on 08/26/2016 and subsequently seen in follow-up on 09/02/2016. Since then he underwent transesophageal echocardiogram and a formal physical therapy evaluation with 6 minute walk test.  He returns to the office today for follow-up. He is accompanied by his wife and daughter who have been very supportive all along. He reports no new problems or complaints. He states that his breathing is at baseline and he really doesn't experience much dyspnea, although he is very much oxygen dependent and relatively inactive. He has never had any pain in his chest.   Current Outpatient Prescriptions  Medication Sig Dispense Refill  . aspirin EC 81 MG tablet Take 1 tablet (81 mg total) by mouth daily. 90 tablet 3  . atorvastatin (LIPITOR) 10 MG tablet TAKE 1 TABLET BY MOUTH EVERY DAY 90 tablet 2  . budesonide-formoterol (SYMBICORT) 160-4.5 MCG/ACT inhaler INHALE 2 PUFFS INTO THE LUNGS TWICE A DAY 10.2 Inhaler 5  . carvedilol (COREG) 3.125 MG tablet TAKE 1 TABLET BY MOUTH TWICE A DAY WITH MEALS 180 tablet 1  . furosemide (LASIX) 40 MG tablet Take 40 mg by mouth daily. May take 1 extra daily as needed for edema    . irbesartan (AVAPRO) 75 MG tablet TAKE 1 TABLET BY MOUTH EVERY DAY 90 tablet 1  . metFORMIN (GLUCOPHAGE) 500 MG tablet 1,000 mg 2 (two) times daily with a meal. TAKE 1 (ONE) TABLET BY MOUTH IN THE MORNING WITH BREAKFAST AND 1/2 TABLET BY MOUTH WITH EVENING MEAL     .  nitroGLYCERIN (NITROSTAT) 0.4 MG SL tablet Place 0.4 mg under the tongue every 5 (five) minutes as needed for chest pain (x 3 doses). Reported on 12/29/2015     No current facility-administered medications for this visit.       Physical Exam:   BP 117/71 (BP Location: Right Arm, Patient Position: Sitting, Cuff Size: Large)   Pulse 85   Resp 16   Ht '6\' 1"'$  (1.854 m)   Wt 199 lb (90.3 kg)   SpO2 95% Comment: ON 2L O2  BMI 26.25 kg/m   General:  Somewhat frail for his age  Chest:   Clear to auscultation  CV:   Regular rate and rhythm without murmur  Incisions:  n/a  Abdomen:  Soft nontender  Extremities:  Warm and well-perfused  Diagnostic Tests:  Transesophageal Echocardiography  Patient:    [name], [name] MR #:       [id number] Study Date: 09/06/2016 Gender:     M Age:        71 Height:     185.4 cm Weight:     90.3 kg BSA:        2.17 m^2 Pt. Status: Room:   ADMITTING  Ena Dawley, M.D.  ATTENDING  Ena Dawley, M.D.  ORDERING   Ena Dawley, M.D.  REFERRING  Ena Dawley, M.D.  cc:  ------------------------------------------------------------------- LV EF: 45% -  50%  ------------------------------------------------------------------- Study Conclusions  - Left ventricle: Systolic function was mildly reduced. The   estimated ejection fraction was in the range of 45% to 50%. Wall   motion was normal; there were no regional wall motion   abnormalities. - Mitral valve: S/P MVR with bioprosthetic valve with 33 mm   Medtronic Mosaic porcine bioprosthesis. There is no central   mitral regurgitation or paravalvular leak. There is limited   motion of the posterior leaflet. Mean transmitral gradient is 7   mmHg.   Valve area by pressure half-time: 1.26 cm^2. - Left atrium: The atrium was massively dilated. No evidence of   thrombus in the atrial cavity or appendage but heavy smoke seen   in the left atrium. The appendage was multilobulated and  large.   Emptying velocity was decreased. - Right atrium: The atrium was dilated. No evidence of thrombus in   the atrial cavity or appendage.  Impressions:  - Aortic valve leaflets are structurally normal, with normal   coaptation and no regurgitation.   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 The pseudoaneurysm measures 32 x 25   mm. The communication is seen on both 2D (image 21, 22) and 3D   images (image 26).     S/P MVR with bioprosthetic valve with 33 mm Medtronic Mosaic   porcine bioprosthesis. There is no central mitral regurgitation   or paravalvular leak. There is limited motion of the posterior   leaflet however anterior leaflet opens very well. Mean   transmitral gradient is 7 mmHg that is most probably attributed   to prosthesis/patient mismatch. There is no significant stenosis.  ------------------------------------------------------------------- Study data:   Procedure:  Initial setup. The patient was brought to the laboratory. Surface ECG leads were monitored. Sedation. Conscious sedation was administered. Transesophageal echocardiography. Topical anesthesia was obtained using viscous lidocaine. A transesophageal probe was inserted by the attending cardiologist. Image quality was adequate.  Study completion:  The patient tolerated the procedure well. There were no complications.         Diagnostic transesophageal echocardiography.  2D and color Doppler.  Birthdate:  Patient birthdate: 1946/06/12.  Age:  Patient is 71 yr old.  Sex:  Gender: male.    BMI: 26.3 kg/m^2.  Study date:  Study date: 09/06/2016. Study time: 12:01 PM.  -------------------------------------------------------------------  ------------------------------------------------------------------- Left ventricle:  Systolic function was mildly reduced. The estimated ejection fraction was in the range of 45% to 50%. Wall motion was normal;  there were no regional wall motion abnormalities.  ------------------------------------------------------------------- Aortic valve:   Structurally normal valve. Trileaflet; normal thickness leaflets. Cusp separation was normal.  Doppler:  There was no significant regurgitation.  ------------------------------------------------------------------- Aorta:  There was mild non-mobile atheroma. Ascending aorta: The ascending aorta was normal in size. Aortic arch: The aortic arch was normal in size. Descending aorta: The descending aorta was normal in size.  ------------------------------------------------------------------- Mitral valve:  S/P MVR with bioprosthetic valve with 33 mm Medtronic Mosaic porcine bioprosthesis. There is no central mitral regurgitation or paravalvular leak. There is limited motion of the posterior leaflet. Mean transmitral gradient is 7 mmHg. Doppler:  There was no significant regurgitation.    Valve area by pressure half-time: 1.26 cm^2. Indexed valve area by pressure half-time: 0.58 cm^2/m^2.    Mean gradient (D): 7 mm Hg.  ------------------------------------------------------------------- Left atrium:  The atrium was massively dilated. No evidence of thrombus in the atrial cavity or appendage but heavy smoke seen  in the left atrium. The appendage was multilobulated and large. Emptying velocity was decreased.  ------------------------------------------------------------------- Right ventricle:  The cavity size was normal. Wall thickness was normal. Pacer wire or catheter noted in right ventricle. Systolic function was normal.  ------------------------------------------------------------------- Pulmonic valve:    Structurally normal valve.  ------------------------------------------------------------------- Tricuspid valve:   Structurally normal valve.   Leaflet separation was normal.  Doppler:  There was mild  regurgitation.  ------------------------------------------------------------------- Right atrium:  The atrium was dilated. Pacer wire or catheter noted in right atrium.  No evidence of thrombus in the atrial cavity or appendage. The appendage was morphologically a right appendage.  ------------------------------------------------------------------- Pericardium:  There was no pericardial effusion.  ------------------------------------------------------------------- Measurements   Mitral valve                        Value  Mitral mean velocity, D             129   cm/s  Mitral pressure half-time           175   ms  Mitral mean gradient, D             7     mm Hg  Mitral valve area, PHT, DP          1.26  cm^2  Mitral valve area/bsa, PHT, DP      0.58  cm^2/m^2  Mitral annulus VTI, D               55    cm  Legend: (L)  and  (H)  mark values outside specified reference range.  ------------------------------------------------------------------- Prepared and Electronically Authenticated by  Ena Dawley, M.D. 2018-02-27T13:54:42   Physical Therapy Evaluation  Patient Details  Name: AIVAN FILLINGIM MRN: [id number] Date of Birth: [birth year] Referring Provider: Dr. Darylene Price   Encounter Date: 09/07/2016              PT End of Session - 09/07/16 1502    Visit Number 1   PT Start Time 1355   PT Stop Time 1435   PT Time Calculation (min) 40 min          Past Medical History:  Diagnosis Date  . Atrial fibrillation ([place])    AV Node ablation January, 2010, for rapid atrial fib  . Atrial septal defect    Closed with surgery January, 2010  . Automatic implantable cardioverter-defibrillator in situ    LV dysfunction and pacer needed for AV node lesion  . Cardiomyopathy    non-ischemic  . CHF (congestive heart failure) ([place])   . Chronic combined systolic and diastolic CHF (congestive heart failure) ([place])   . Colon polyps   . COPD  (chronic obstructive pulmonary disease) (HCC)    O2- 2 liters, nasal cannula, q night   . COPD GOLD II 01/11/2007   PFT's 09/25/13  FEV1  2.18 (61%) ratio 64 no change p B2 and DLC0  32% corrects to 68% - trial off advair and acei rec starting  08/23/2013     . CVA (cerebral vascular accident) ([place]) 2009   denies residual on 08/14/2013  . Diabetes mellitus without complication ([place])   . Dyslipidemia   . Dysrhythmia   . Ejection fraction < 50%   . Endocarditis    Bacterial, 2009  . Headache(784.0)    related to stroke only  . HLD (hyperlipidemia)   . Hypertension   . Intracranial hemorrhage (HCC)    Coumadin cannot  be used because of the history of his bleed  . Lung cancer ([place]) 11/29/2013   T1N0 Stage Ia non-small cell carcinoma left lung treated with wedge resection  . Mitral regurgitation    Severe symptomatic primary MR due to bacterial endocarditis, treated w/ MVR // Echo 1/18 EF 40-45, diffuse HK, dilated aorta at 42 mm/aortic root 47 mm; linear echogenic structure in ascending aorta - suspect reverberation artifact / consider CT or TEE to rule out dissection flap, bioprosthetic MVR with mean gradient 3, severe LAE, low normal RVSF, severe RAE, moderate TR, PASP 42  . Myocardial infarction 2010  . Pacemaker   . Permanent atrial fibrillation    Originally Coumadin use for atrial fibrillation  //   he had intracerebral hemorrhage with an INR of 2.3 June, 2009. Anticoagulation could no longer be used.  //  Rapid atrial fibrillation after inferior MI October, 2010..........[facility] Kitchen AV node ablation done at that time with ICD pacemaker placed (EF 35%).   //   Left atrial appendage tied off at the time of mitral valve surgery January, 2010 (maze pro  . Pneumonia    "this is my first case" (08/14/2013)  . Prosthetic valve dysfunction    Mild mitral stenosis  . Pulmonary hypertension    Moderate  . Renal artery stenosis (HCC)    Mild by history  . 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 Spontaneous pneumothorax    right thoracotomy - distant past  . Status post minimally invasive mitral valve replacement with bioprosthetic valve    33 mm Medtronic Mosaic porcine bioprosthesis placed via right mini thoracotomy for bacterial endocarditis complicated by severe MR and CHF   . Thoracic aortic aneurysm ([place]) 08/11/2016   a - Chest CTA 1/18:  Aneurysmal dilatation of aortic root is noted at 5.1 cm.          Past Surgical History:  Procedure Laterality Date  . APPENDECTOMY    . ASD REPAIR, SECUNDUM  07/17/2008   pericardial patch closure of ASD  . AV NODE ABLATION  07/2008   for rapid atrial fib  . CARDIAC CATHETERIZATION    . CARDIAC DEFIBRILLATOR PLACEMENT  ~ [address] [place]  . CARDIAC VALVE REPLACEMENT    . CATARACT EXTRACTION W/ INTRAOCULAR LENS  IMPLANT, BILATERAL Bilateral   . IMPLANTABLE CARDIOVERTER DEFIBRILLATOR (ICD) GENERATOR CHANGE N/A 02/06/2012   Procedure: ICD GENERATOR CHANGE;  Surgeon: [name], MD;  Location[facility] C[facility] LAB;  Service: Cardiovascular;  Laterality: N/A;  . INSERT / REPLACE / REMOVE PACEMAKER    . LEEP    . MASS BIOPSY Left    neck mass  . MITRAL VALVE REPLACEMENT Right 07/17/2008   75m Medtronic Mosaic porcine bioprosthesis  . PENILE PROSTHESIS IMPLANT    . RIGHT HEART CATHETERIZATION N/A 08/16/2013   Procedure: RIGHT HEART CATH;  Surgeon: PJosue Hector MD;  [facility];  Service: Cardiovascular;  Laterality: N/A;  . THORACOTOMY Right 1970's   spontaneous pneumothorax - while in the [employer] . TONSILLECTOMY    . VIDEO ASSISTED THORACOSCOPY (VATS)/WEDGE RESECTION Left 11/29/2013   Procedure: Video assisted thoracoscopy for wedge resection; mini thoracotomy;  Surgeon: CRexene Alberts MD;  [facility] [facility]  Service: Thoracic;  Laterality: Left;    There were no vitals filed for this visit.               Subjective Assessment - 09/07/16 1359    Subjective Pt  reports no real  changes in function recently. MD wanted him to have physical therapy evaluation prior to surgical intervention for mitral valve replacement.   Currently in Pain? No/denies                  [facility] PT Assessment - 09/07/16 0001            Assessment   Medical Diagnosis sinus of valsalve aneurysym   Referring Provider Dr. Darylene Price    Onset Date/Surgical Date 09/02/16       Precautions   Precautions None       Restrictions   Weight Bearing Restrictions No       Balance Screen   Has the patient fallen in the past 6 months No   Has the patient had a decrease in activity level because of a fear of falling?  No   Is the patient reluctant to leave their home because of a fear of falling?  No       Home Environment   Living Environment Private residence   Living Arrangements Spouse/significant other   Home Access Stairs to enter   Entrance Stairs-Number of Steps 2   Entrance Stairs-Rails Left;Right;Can reach both   [place] One level       Prior Function   Level of Independence Independent with household mobility without device;Independent with community mobility without device       Observation/Other Assessments   Observations 2 L continuous supplemental oxygen       Posture/Postural Control   Posture/Postural Control Postural limitations   Postural Limitations Forward head;Rounded Shoulders  mild       ROM / Strength   AROM / PROM / Strength AROM;Strength       AROM   Overall AROM Comments grossly WNL, R ankle DF limited compared to R       Strength   Overall Strength Comments grossly 4/5 throughout   Strength Assessment Site Hand   Right/Left hand Right;Left   Right Hand 3 Point Pinch 61 lbs   Left Hand Grip (lbs) 66       Ambulation/Gait   Gait Comments Pt ambulated independently pulling portable oxygen behind him. Pt limited in gait distance due to oxygen desaturation despite  supplemental oxygen.                  [facility] Pre-Surgical Assessment - 09/07/16 0001    5 Meter Walk Test- trial 1 6 sec   5 Meter Walk Test- trial 2 6 sec.    5 Meter Walk Test- trial 3 6 sec.   5 meter walk test average 6 sec   4 Stage Balance Test tolerated for:  3 sec.   4 Stage Balance Test Position 3   Sit To Stand Test- trial 1 16 sec.   ADL/IADL Independent with: Bathing;Dressing;Meal prep;Finances   ADL/IADL Needs Assistance with: Yard work   6 Minute Walk- Baseline yes   BP (mmHg) 114/66   HR (bpm) 73   02 Sat (%RA) 92 %   Modified Borg Scale for Dyspnea 0- Nothing at all   Perceived Rate of Exertion (Borg) 6-   6 Minute Walk Post Test yes   BP (mmHg) 117/66   HR (bpm) 87   02 Sat (%RA) 87 %  decr to 79% immediately after; took 3 min to recover to 90%   Modified Borg Scale for Dyspnea 0- Nothing at all   Perceived Rate of Exertion (Borg) 9- very light   Aerobic Endurance  Distance Walked 370   Endurance additional comments Pt had to rest due to oxygen desaturation to 87%. at the 2:40 minute mark. It took >3 minutes to return to 90% so unable to resume 6 minute walk. Pt did not report any shortness or breath nor fatigue. Pt reports the distance walked during the test was about the farthest he would walk at any one time during his typical daily activities.                    PT Education - September 25, 2016 1501    Education provided Yes   Education Details fall risk, oxygen desaturation with walking   Person(s) Educated Patient   Methods Explanation   Comprehension Verbalized understanding                    Plan - 2016-09-25 1502    Clinical Impression Statement see below   PT Frequency One time visit   Consulted and Agree with Plan of Care Patient     Clinical Impression Statement: Pt is a 72 yo male presenting to OP PT for evaluation prior to mitral valve replacement surgery due to sinus of valsalva aneurysm.  Pt reports he is asymptomatic. Pt presents with good ROM with mild limitation in Right ankle DF, mild weakness throughout, fair balance and is at high fall risk 4 stage balance test, good walking speed and poor aerobic endurance per 6 minute walk test. Pt ambulated a total of 370' feet in 6 minute walk, all in the first 2:40. He then needed to rest due to oxygen decreasing to 87% with 2L supplemental O2. Upon sitting, it dropped to 79% and took > 3 minutes to recover to 90%. Based on the Short Physical Performance Battery, patient has a frailty rating of 9/12 with </= 5/12 considered frail.   Patient demonstrated the following deficits and impairments:     Visit Diagnosis: Other abnormalities of gait and mobility  Abnormal posture  Muscle weakness (generalized)              G-Codes - 09/25/2016 1503    Functional Assessment Tool Used (Outpatient Only) 6 minute walk 370'   Functional Limitation Mobility: Walking and moving around   Mobility: Walking and Moving Around Current Status [phone]) At least 80 percent but less than 100 percent impaired, limited or restricted   Mobility: Walking and Moving Around Goal Status [phone]) At least 80 percent but less than 100 percent impaired, limited or restricted   Mobility: Walking and Moving Around Discharge Status [phone]) At least 80 percent but less than 100 percent impaired, limited or restricted       Problem List     Patient Active Problem List   Diagnosis Date Noted  . Aortic root dilatation ([place])   . 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 Thoracic aortic aneurysm ([place]) 08/11/2016  . Atherosclerosis of aorta ([place]) 08/11/2016  . Orthostatic hypotension 09/26/2015  . Near syncope 09/24/2015  . Syncope 09/24/2015  . Hepatic cirrhosis ([place]) 09/25/2014  . History of colonic polyps 09/25/2014  . Serum total bilirubin elevated 06/30/2014  . Follow-up examination, following unspecified surgery 01/27/2014  . Lung cancer  ([place]) 11/29/2013  . Prosthetic valve dysfunction   . Chronic combined systolic and diastolic CHF (congestive heart failure) ([place])   . Diabetes ([place]) 11/04/2013  . Myocardial infarction 11/01/2013  . Cardiomyopathy, nonischemic ([place]) 11/01/2013  . H/O intracranial hemorrhage 08/13/2013  . H/O endocarditis 08/13/2013  . H/O: CVA (cerebrovascular  accident) 08/13/2013  . Chronic respiratory failure assoc with chf/ PAH 08/12/2013  . H/O atrioventricular nodal ablation   . S/P MVR (mitral valve replacement)   . Chronic systolic CHF (congestive heart failure) ([place]) 01/08/2012  . Hypertension   . Dyslipidemia   . Permanent atrial fibrillation   . Ejection fraction < 50%   . ICD (implantable cardioverter-defibrillator), biventricular, in situ   . Renal artery stenosis ([place])   . Pulmonary hypertension ([place])   . Primary cancer of left upper lobe of lung ([place]) 04/20/2009  . COLONIC POLYPS, ADENOMATOUS 03/22/2007  . COPD GOLD II 01/11/2007  . GERD 01/11/2007    [place], PT 09/07/2016, 3:04 PM  [facility] [address] [place], [place], [address] Phone: [phone]   Fax:  [phone]    Impression:  I have personally reviewed the patient's cardiac gated CT angiogram and transesophageal echocardiogram with Dr. Meda Coffee.  CT angiogram and transesophageal echocardiogram confirm the presence of what appears to be a false aneurysm arising from the right sinus of Valsalva. This false aneurysm arises beneath the orifice of the right coronary artery.  The right coronary artery appears to be displaced in a cephalad direction and originating from the true lumen of the aorta. The aortic valve itself is trileaflet and functioning normally. All of these findings are not likely to be consistent with a chronic localized dissection, and under the circumstances it seems likely that this false aneurysm developed as a complication of the  patient's previous endocarditis. The false aneurysm is nowhere near the patient's mitral valve prosthesis and therefore probably not related to his surgery.  It has clearly been present since 2015 when the patient first underwent non-gated CT scan of the chest.  It is unclear whether or not the false aneurysm may be enlarging.    I have personally reviewed the results of the patient's recent pulmonary function testing and discuss them at length with the patient's pulmonologist, Dr. Elsworth Soho. In addition, I have reviewed the results from the patient's recent formal physical therapy evaluation. The patient is noted to have severely reduced diffusion capacity with chronic hypoxemia at baseline for which he is oxygen dependent.  He did very poorly with his 6 minute walk test and had to stop in less than 3 minutes due to severe hypoxemia during ambulation despite the fact that he was ambulating using continuous oxygen at 2 L/m.  There is no question that with any major surgical procedure the patient would be at very high risk for the development of postoperative respiratory failure.    Plan:  I have again discussed the nature of this problem at length with the patient, his wife, and his daughter in the office today. We discussed alternatives at length including continued observation using serial cardiac gated CT angiogram to assess for signs of progressive enlargement of the false aneurysm versus an attempt at high risk surgical repair. Although the patient remains entirely asymptomatic at this time, there remains some risk for acute rupture, aortic dissection, acute myocardial infarction, and/or death. On the other hand, risks associated with surgery would unquestionably be very high, with particular concern that the patient might develop ventilator-dependent respiratory failure even following uncomplicated surgical repair. After considerable discussion we have elected to manage the patient conservatively with plans  for him to return in approximately 6 months for follow-up cardiac gated CT angiogram of the heart to reassess the size and appearance of this false aneurysm. We will revisit consideration of possible  high risk surgical intervention at that time if there are clear signs that the aneurysm is enlarging. The patient and his family understand that during the interim period of time there remains some risk of acute rupture, aortic dissection, and/or death. We discussed symptoms to be concerned about including the development of chest pain and/or sudden changes in shortness of breath. The patient and his family understand that he would not be considered a candidate for emergent surgical intervention in the setting of hemodynamic compromise and/or acute respiratory failure.  All of their questions have been answered.  I spent in excess of 30 minutes during the conduct of this office consultation and >50% of this time involved direct face-to-face encounter with the patient for counseling and/or coordination of their care.   Valentina Gu. Roxy Manns, MD 09/12/2016 3:47 PM

## 2016-09-20 ENCOUNTER — Ambulatory Visit (INDEPENDENT_AMBULATORY_CARE_PROVIDER_SITE_OTHER): Payer: Medicare Other | Admitting: Gastroenterology

## 2016-09-20 ENCOUNTER — Encounter: Payer: Self-pay | Admitting: Gastroenterology

## 2016-09-20 ENCOUNTER — Other Ambulatory Visit (INDEPENDENT_AMBULATORY_CARE_PROVIDER_SITE_OTHER): Payer: Medicare Other

## 2016-09-20 VITALS — BP 100/50 | HR 72 | Ht 72.0 in | Wt 198.5 lb

## 2016-09-20 DIAGNOSIS — K7460 Unspecified cirrhosis of liver: Secondary | ICD-10-CM

## 2016-09-20 LAB — CBC WITH DIFFERENTIAL/PLATELET
BASOS ABS: 0.1 10*3/uL (ref 0.0–0.1)
Basophils Relative: 0.7 % (ref 0.0–3.0)
EOS PCT: 1.7 % (ref 0.0–5.0)
Eosinophils Absolute: 0.2 10*3/uL (ref 0.0–0.7)
HEMATOCRIT: 50.3 % (ref 39.0–52.0)
Hemoglobin: 16.8 g/dL (ref 13.0–17.0)
Lymphocytes Relative: 8.1 % — ABNORMAL LOW (ref 12.0–46.0)
Lymphs Abs: 0.7 10*3/uL (ref 0.7–4.0)
MCHC: 33.3 g/dL (ref 30.0–36.0)
MCV: 95 fl (ref 78.0–100.0)
MONOS PCT: 9.1 % (ref 3.0–12.0)
Monocytes Absolute: 0.8 10*3/uL (ref 0.1–1.0)
Neutro Abs: 7.3 10*3/uL (ref 1.4–7.7)
Neutrophils Relative %: 80.4 % — ABNORMAL HIGH (ref 43.0–77.0)
Platelets: 180 10*3/uL (ref 150.0–400.0)
RBC: 5.3 Mil/uL (ref 4.22–5.81)
RDW: 15 % (ref 11.5–15.5)
WBC: 9.1 10*3/uL (ref 4.0–10.5)

## 2016-09-20 LAB — COMPREHENSIVE METABOLIC PANEL
ALBUMIN: 4.5 g/dL (ref 3.5–5.2)
ALT: 7 U/L (ref 0–53)
AST: 10 U/L (ref 0–37)
Alkaline Phosphatase: 71 U/L (ref 39–117)
BUN: 27 mg/dL — ABNORMAL HIGH (ref 6–23)
CHLORIDE: 99 meq/L (ref 96–112)
CO2: 29 meq/L (ref 19–32)
Calcium: 10.1 mg/dL (ref 8.4–10.5)
Creatinine, Ser: 1.07 mg/dL (ref 0.40–1.50)
GFR: 72.77 mL/min (ref >60.00)
Glucose, Bld: 192 mg/dL — ABNORMAL HIGH (ref 70–99)
POTASSIUM: 5.2 meq/L — AB (ref 3.5–5.1)
SODIUM: 138 meq/L (ref 135–145)
Total Bilirubin: 3.2 mg/dL — ABNORMAL HIGH (ref 0.2–1.2)
Total Protein: 7.6 g/dL (ref 6.0–8.3)

## 2016-09-20 LAB — PROTIME-INR
INR: 1.1 ratio — AB (ref 0.8–1.0)
Prothrombin Time: 12 s (ref 9.6–13.1)

## 2016-09-20 NOTE — Progress Notes (Signed)
Review of pertinent gastrointestinal problems: 1. Cirrhosis: Originally seen by AE and Dr. Deatra Ina 2015: strong history of alcohol abuse and they suspected that was the cause. Viral serologies were drawn and they were all negative.. He began immunization series for hepatitis A and B.  Normal PLTs, normal INR  Most recent imaging [place] 07/2016 cirrhosisi, no masses.  Most recent AFP 07/2016  normal  Most recent EGD   Chronically elevated T bili (Gilberts' and cirrhosis mixed)  2. Personal history of adenomatous polyps:  Colonoscopy Dr. Deatra Ina 03/2007; several polyps removed; lipomas, HP and a single TA.  HPI: This is a  very pleasant 71 year old man whom I last saw December 2016, a little over a year ago. Chief complaint is cirrhosis  He feels very well from a liver perspective. No swelling in his legs, no ascites, no overt GI bleeding, no signs of hepatic encephalopathy.  He had a recent TEE that suggested his heart is functioning a bit better than it has previously with an ejection fraction of around 45% now.  He is still on chronic oxygen.   Weight down 12 pouinds since last visit 1-2 years ago.  ROS: complete GI ROS as described in HPI.  Constitutional:  No unintentional weight loss   Past Medical History:  Diagnosis Date  . Atrial fibrillation ([place])    AV Node ablation January, 2010, for rapid atrial fib  . Atrial septal defect    Closed with surgery January, 2010  . Automatic implantable cardioverter-defibrillator in situ    LV dysfunction and pacer needed for AV node lesion  . Cardiomyopathy    non-ischemic  . CHF (congestive heart failure) ([place])   . Chronic combined systolic and diastolic CHF (congestive heart failure) ([place])   . Colon polyps   . COPD (chronic obstructive pulmonary disease) (HCC)    O2- 2 liters, nasal cannula, q night   . COPD GOLD II 01/11/2007   PFT's 09/25/13  FEV1  2.18 (61%) ratio 64 no change p B2 and DLC0  32% corrects to 68% - trial off advair and  acei rec starting  08/23/2013     . CVA (cerebral vascular accident) ([employer]) 2009   denies residual on 08/14/2013  . Diabetes mellitus without complication ([place])   . Dyslipidemia   . Dysrhythmia   . Ejection fraction < 50%   . Endocarditis    Bacterial, 2009  . Headache(784.0)    related to stroke only  . HLD (hyperlipidemia)   . Hypertension   . Intracranial hemorrhage (HCC)    Coumadin cannot be used because of the history of his bleed  . Lung cancer ([place]) 11/29/2013   T1N0 Stage Ia non-small cell carcinoma left lung treated with wedge resection  . Mitral regurgitation    Severe symptomatic primary MR due to bacterial endocarditis, treated w/ MVR // Echo 1/18 EF 40-45, diffuse HK, dilated aorta at 42 mm/aortic root 47 mm; linear echogenic structure in ascending aorta - suspect reverberation artifact / consider CT or TEE to rule out dissection flap, bioprosthetic MVR with mean gradient 3, severe LAE, low normal RVSF, severe RAE, moderate TR, PASP 42  . Myocardial infarction 2010  . Pacemaker   . Permanent atrial fibrillation    Originally Coumadin use for atrial fibrillation  //   he had intracerebral hemorrhage with an INR of 2.3 June, 2009. Anticoagulation could no longer be used.  //  Rapid atrial fibrillation after inferior MI October, 2010..........Marland Kitchen AV node ablation done at that time  with ICD pacemaker placed (EF 35%).   //   Left atrial appendage tied off at the time of mitral valve surgery January, 2010 (maze pro  . Pneumonia    "this is my first case" (08/14/2013)  . Prosthetic valve dysfunction    Mild mitral stenosis  . Pulmonary hypertension    Moderate  . Renal artery stenosis (HCC)    Mild by history  . 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 Spontaneous pneumothorax    right thoracotomy - distant past  . Status post minimally invasive mitral valve replacement with bioprosthetic valve    33 mm Medtronic Mosaic porcine bioprosthesis placed via right mini thoracotomy for  bacterial endocarditis complicated by severe MR and CHF   . Thoracic aortic aneurysm ([place]) 08/11/2016   a - Chest CTA 1/18:  Aneurysmal dilatation of aortic root is noted at 5.1 cm.     Past Surgical History:  Procedure Laterality Date  . APPENDECTOMY    . ASD REPAIR, SECUNDUM  07/17/2008   pericardial patch closure of ASD  . AV NODE ABLATION  07/2008   for rapid atrial fib  . CARDIAC CATHETERIZATION    . CARDIAC DEFIBRILLATOR PLACEMENT  ~ [address] Jude  . CARDIAC VALVE REPLACEMENT    . CATARACT EXTRACTION W/ INTRAOCULAR LENS  IMPLANT, BILATERAL Bilateral   . IMPLANTABLE CARDIOVERTER DEFIBRILLATOR (ICD) GENERATOR CHANGE N/A 02/06/2012   Procedure: ICD GENERATOR CHANGE;  Surgeon: [name], MD;  Location: [facility] CATH LAB;  Service: Cardiovascular;  Laterality: N/A;  . INSERT / REPLACE / REMOVE PACEMAKER    . LEEP    . MASS BIOPSY Left    neck mass  . MITRAL VALVE REPLACEMENT Right 07/17/2008   58m Medtronic Mosaic porcine bioprosthesis  . PENILE PROSTHESIS IMPLANT    . RIGHT HEART CATHETERIZATION N/A 08/16/2013   Procedure: RIGHT HEART CATH;  Surgeon: PJosue Hector MD;  [facility];  Service: Cardiovascular;  Laterality: N/A;  . TEE WITHOUT CARDIOVERSION N/A 09/06/2016   Procedure: TRANSESOPHAGEAL ECHOCARDIOGRAM (TEE);  Surgeon: KDorothy Spark MD;  [facility];  Service: Cardiovascular;  Laterality: N/A;  . THORACOTOMY Right 1970's   [place]    . VIDEO ASSISTED THORACOSCOPY (VATS)/WEDGE RESECTION Left 11/29/2013   Procedure: Video assisted thoracoscopy for wedge resection; mini thoracotomy;  Surgeon: CRexene Alberts MD;  [facility];  Service: Thoracic;  Laterality: Left;    Current Outpatient Prescriptions  Medication Sig Dispense Refill  . aspirin EC 81 MG tablet Take 1 tablet (81 mg total) by mouth daily. 90 tablet 3  . atorvastatin (LIPITOR) 10 MG tablet TAKE 1 TABLET BY MOUTH EVERY DAY 90  tablet 2  . budesonide-formoterol (SYMBICORT) 160-4.5 MCG/ACT inhaler INHALE 2 PUFFS INTO THE LUNGS TWICE A DAY 10.2 Inhaler 5  . carvedilol (COREG) 3.125 MG tablet TAKE 1 TABLET BY MOUTH TWICE A DAY WITH MEALS 180 tablet 1  . furosemide (LASIX) 40 MG tablet Take 40 mg by mouth daily. May take 1 extra daily as needed for edema    . irbesartan (AVAPRO) 75 MG tablet TAKE 1 TABLET BY MOUTH EVERY DAY 90 tablet 1  . metFORMIN (GLUCOPHAGE) 500 MG tablet 1,000 mg 2 (two) times daily with a meal. TAKE 1 (ONE) TABLET BY MOUTH IN THE MORNING WITH BREAKFAST AND 1/2 TABLET BY MOUTH WITH EVENING MEAL     . OXYGEN Inhale 2 L/min into the lungs continuous.    .Marland Kitchen  nitroGLYCERIN (NITROSTAT) 0.4 MG SL tablet Place 0.4 mg under the tongue every 5 (five) minutes as needed for chest pain (x 3 doses). Reported on 12/29/2015     No current facility-administered medications for this visit.     Allergies as of 09/20/2016 - Review Complete 09/20/2016  Allergen Reaction Noted  . Anticoagulant compound Other (See Comments) 08/12/2013  . Warfarin sodium Other (See Comments)     Family History  Problem Relation Age of Onset  . Stomach cancer Father   . Stroke Mother     Social History   Social History  . Marital status: Married    Spouse name: [name]  . Number of children: 0  . Years of education: College   Occupational History  . Retired Retired    Tour manager   Social History Main Topics  . Smoking status: Former Smoker    Packs/day: 1.00    Years: 45.00    Types: Cigarettes    Quit date: 08/11/2013  . Smokeless tobacco: Never Used     Comment: 08/14/2013 "quit smoking in 2009"  . Alcohol use No     Comment: 08/14/2013 "used to drink beer; quit:in 1982"  . Drug use: No  . Sexual activity: Yes   Other Topics Concern  . Not on file   Social History Narrative   Patient lives at home with his spouse.   Caffeine Use: none   Worked for the post office   Has two boys and a girl. All live local.       Physical Exam: BP (!) 100/50 (BP Location: Left Arm, Patient Position: Sitting, Cuff Size: Normal)   Pulse 72   Ht 6' (1.829 m)   Wt 198 lb 8 oz (90 kg)   BMI 26.92 kg/m  Constitutional: generally Ill appearing, oxygen tank at his side Psychiatric: alert and oriented x3 Abdomen: soft, nontender, nondistended, no obvious ascites, no peritoneal signs, normal bowel sounds No peripheral edema noted in lower extremities  Assessment and plan: 71 y.o. male with cirrhosis well compensated  His cirrhosis is likely from long-standing alcohol abuse. His last drink was many years ago fortunately. He is up-to-date on hepatoma screening. I'm going to restage his liver disease with CBC, coags and complete metabolic profile. At his last visit I deferred screening for esophageal varices given his overall poor health and multiple significant comorbidities. If she looks a bit better today and his ejection fraction is improved and so I think it is reasonable to screen him for esophageal varices with EGD at his soonest convenience. He is due for colon cancer screening around now I think I'm going to defer on that given his multiple comorbidities.  Please see the "Patient Instructions" section for addition details about the plan.  Owens Loffler, MD [facility] 09/20/2016, 10:49 AM

## 2016-09-20 NOTE — Patient Instructions (Signed)
You will have labs checked today in the basement lab.  Please head down after you check out with the front desk  (cbc, cmet, inr) You will be set up for an upper endoscopy for varices screening ([place] on oxygen).

## 2016-09-26 ENCOUNTER — Telehealth: Payer: Self-pay | Admitting: Gastroenterology

## 2016-09-26 NOTE — Telephone Encounter (Signed)
Discuss the lab results from 09/20/16 with Mrs. Italiano. She will have the patient to [facility] long Endoscopy on 09/29/16 for his EGD. Confirmed this with her also.

## 2016-09-27 ENCOUNTER — Encounter (HOSPITAL_COMMUNITY): Payer: Self-pay | Admitting: *Deleted

## 2016-09-27 NOTE — Progress Notes (Signed)
Spoke with [name] aboot rep says rep does not need to come and he will call device clinic and get new orders faxed for patient that say rep does not need to come.

## 2016-09-28 NOTE — Anesthesia Preprocedure Evaluation (Addendum)
Anesthesia Evaluation  Patient identified by MRN, date of birth, ID band Patient awake    Airway Mallampati: II  TM Distance: >3 FB Neck ROM: Full    Dental  (+) Teeth Intact   Pulmonary COPD, former smoker,    breath sounds clear to auscultation       Cardiovascular hypertension, + Past MI and +CHF  + dysrhythmias + pacemaker + Cardiac Defibrillator  Rhythm:Regular Rate:Normal     Neuro/Psych    GI/Hepatic GERD  ,(+) Cirrhosis       ,   Endo/Other  diabetes  Renal/GU      Musculoskeletal   Abdominal (+) + obese,   Peds  Hematology   Anesthesia Other Findings   Reproductive/Obstetrics                            Anesthesia Physical Anesthesia Plan  ASA: IV  Anesthesia Plan: MAC   Post-op Pain Management:    Induction: Intravenous  Airway Management Planned: Natural Airway and Nasal Cannula  Additional Equipment:   Intra-op Plan:   Post-operative Plan:   Informed Consent: I have reviewed the patients History and Physical, chart, labs and discussed the procedure including the risks, benefits and alternatives for the proposed anesthesia with the patient or authorized representative who has indicated his/her understanding and acceptance.   Dental advisory given  Plan Discussed with: CRNA  Anesthesia Plan Comments:         Anesthesia Quick Evaluation

## 2016-09-29 ENCOUNTER — Ambulatory Visit (HOSPITAL_COMMUNITY)
Admission: RE | Admit: 2016-09-29 | Discharge: 2016-09-29 | Disposition: A | Discharge status: Home / Self Care | Payer: Medicare Other | Source: Ambulatory Visit | Attending: Gastroenterology | Admitting: Gastroenterology

## 2016-09-29 ENCOUNTER — Ambulatory Visit (HOSPITAL_COMMUNITY): Payer: Medicare Other | Admitting: Anesthesiology

## 2016-09-29 ENCOUNTER — Encounter (HOSPITAL_COMMUNITY): Payer: Self-pay | Admitting: Gastroenterology

## 2016-09-29 ENCOUNTER — Encounter (HOSPITAL_COMMUNITY)
Admission: RE | Disposition: A | Discharge status: Home / Self Care | Payer: Self-pay | Source: Ambulatory Visit | Attending: Gastroenterology

## 2016-09-29 DIAGNOSIS — Z7951 Long term (current) use of inhaled steroids: Secondary | ICD-10-CM | POA: Diagnosis not present

## 2016-09-29 DIAGNOSIS — I4891 Unspecified atrial fibrillation: Secondary | ICD-10-CM | POA: Insufficient documentation

## 2016-09-29 DIAGNOSIS — I272 Pulmonary hypertension, unspecified: Secondary | ICD-10-CM | POA: Insufficient documentation

## 2016-09-29 DIAGNOSIS — K2990 Gastroduodenitis, unspecified, without bleeding: Secondary | ICD-10-CM

## 2016-09-29 DIAGNOSIS — Q211 Atrial septal defect: Secondary | ICD-10-CM | POA: Diagnosis not present

## 2016-09-29 DIAGNOSIS — Z9981 Dependence on supplemental oxygen: Secondary | ICD-10-CM | POA: Diagnosis not present

## 2016-09-29 DIAGNOSIS — K2970 Gastritis, unspecified, without bleeding: Secondary | ICD-10-CM | POA: Diagnosis not present

## 2016-09-29 DIAGNOSIS — Z7984 Long term (current) use of oral hypoglycemic drugs: Secondary | ICD-10-CM | POA: Diagnosis not present

## 2016-09-29 DIAGNOSIS — I712 Thoracic aortic aneurysm, without rupture: Secondary | ICD-10-CM | POA: Diagnosis not present

## 2016-09-29 DIAGNOSIS — K209 Esophagitis, unspecified: Secondary | ICD-10-CM | POA: Diagnosis not present

## 2016-09-29 DIAGNOSIS — K319 Disease of stomach and duodenum, unspecified: Secondary | ICD-10-CM | POA: Insufficient documentation

## 2016-09-29 DIAGNOSIS — Z13810 Encounter for screening for upper gastrointestinal disorder: Secondary | ICD-10-CM

## 2016-09-29 DIAGNOSIS — Z87891 Personal history of nicotine dependence: Secondary | ICD-10-CM | POA: Diagnosis not present

## 2016-09-29 DIAGNOSIS — Z800 Family history of malignant neoplasm of digestive organs: Secondary | ICD-10-CM | POA: Insufficient documentation

## 2016-09-29 DIAGNOSIS — Z7982 Long term (current) use of aspirin: Secondary | ICD-10-CM | POA: Insufficient documentation

## 2016-09-29 DIAGNOSIS — Z95810 Presence of automatic (implantable) cardiac defibrillator: Secondary | ICD-10-CM | POA: Insufficient documentation

## 2016-09-29 DIAGNOSIS — K7460 Unspecified cirrhosis of liver: Secondary | ICD-10-CM | POA: Diagnosis not present

## 2016-09-29 DIAGNOSIS — K5710 Diverticulosis of small intestine without perforation or abscess without bleeding: Secondary | ICD-10-CM | POA: Diagnosis not present

## 2016-09-29 DIAGNOSIS — I5042 Chronic combined systolic (congestive) and diastolic (congestive) heart failure: Secondary | ICD-10-CM | POA: Diagnosis not present

## 2016-09-29 DIAGNOSIS — Z952 Presence of prosthetic heart valve: Secondary | ICD-10-CM | POA: Diagnosis not present

## 2016-09-29 DIAGNOSIS — Z79899 Other long term (current) drug therapy: Secondary | ICD-10-CM | POA: Insufficient documentation

## 2016-09-29 DIAGNOSIS — K2950 Unspecified chronic gastritis without bleeding: Secondary | ICD-10-CM | POA: Diagnosis not present

## 2016-09-29 DIAGNOSIS — Z888 Allergy status to other drugs, medicaments and biological substances status: Secondary | ICD-10-CM | POA: Insufficient documentation

## 2016-09-29 HISTORY — PX: ESOPHAGOGASTRODUODENOSCOPY (EGD) WITH PROPOFOL: SHX5813

## 2016-09-29 LAB — BASIC METABOLIC PANEL
Anion gap: 9 (ref 5–15)
BUN: 24 mg/dL — ABNORMAL HIGH (ref 6–20)
CALCIUM: 9.4 mg/dL (ref 8.9–10.3)
CO2: 31 mmol/L (ref 22–32)
CREATININE: 0.91 mg/dL (ref 0.61–1.24)
Chloride: 102 mmol/L (ref 101–111)
GFR calc non Af Amer: >60 mL/min (ref >60)
Glucose, Bld: 201 mg/dL — ABNORMAL HIGH (ref 65–99)
Potassium: 4.7 mmol/L (ref 3.5–5.1)
Sodium: 142 mmol/L (ref 135–145)

## 2016-09-29 LAB — GLUCOSE, CAPILLARY: Glucose-Capillary: 172 mg/dL — ABNORMAL HIGH (ref 65–99)

## 2016-09-29 SURGERY — ESOPHAGOGASTRODUODENOSCOPY (EGD) WITH PROPOFOL
Anesthesia: Monitor Anesthesia Care

## 2016-09-29 MED ORDER — PROPOFOL 10 MG/ML IV BOLUS
INTRAVENOUS | Status: AC
  Filled 2016-09-29: qty 60

## 2016-09-29 MED ORDER — PROPOFOL 10 MG/ML IV BOLUS
INTRAVENOUS | Status: DC | PRN
  Administered 2016-09-29: 20 mg via INTRAVENOUS
  Administered 2016-09-29: 10 mg via INTRAVENOUS

## 2016-09-29 MED ORDER — LIDOCAINE 2% (20 MG/ML) 5 ML SYRINGE
INTRAMUSCULAR | Status: DC | PRN
  Administered 2016-09-29: 60 mg via INTRAVENOUS

## 2016-09-29 MED ORDER — PHENYLEPHRINE HCL 10 MG/ML IJ SOLN
INTRAMUSCULAR | Status: DC | PRN
  Administered 2016-09-29: 40 ug via INTRAVENOUS

## 2016-09-29 MED ORDER — SODIUM CHLORIDE 0.9 % IV SOLN
INTRAVENOUS | Status: DC
  Administered 2016-09-29: 08:00:00 via INTRAVENOUS

## 2016-09-29 MED ORDER — PHENYLEPHRINE 40 MCG/ML (10ML) SYRINGE FOR IV PUSH (FOR BLOOD PRESSURE SUPPORT)
PREFILLED_SYRINGE | INTRAVENOUS | Status: AC
  Filled 2016-09-29: qty 10

## 2016-09-29 MED ORDER — PROPOFOL 500 MG/50ML IV EMUL
INTRAVENOUS | Status: DC | PRN
  Administered 2016-09-29: 50 ug/kg/min via INTRAVENOUS

## 2016-09-29 MED ORDER — LIDOCAINE 2% (20 MG/ML) 5 ML SYRINGE
INTRAMUSCULAR | Status: AC
  Filled 2016-09-29: qty 5

## 2016-09-29 SURGICAL SUPPLY — 14 items

## 2016-09-29 NOTE — H&P (View-Only) (Signed)
Review of pertinent gastrointestinal problems: 1. Cirrhosis: Originally seen by AE and Dr. Deatra Ina 2015: strong history of alcohol abuse and they suspected that was the cause. Viral serologies were drawn and they were all negative.. He began immunization series for hepatitis A and B.  Normal PLTs, normal INR  Most recent imaging [place] 07/2016 cirrhosisi, no masses.  Most recent AFP 07/2016  normal  Most recent EGD   Chronically elevated T bili (Gilberts' and cirrhosis mixed)  2. Personal history of adenomatous polyps:  Colonoscopy Dr. Deatra Ina 03/2007; several polyps removed; lipomas, HP and a single TA.  HPI: This is a  very pleasant 71 year old man whom I last saw December 2016, a little over a year ago. Chief complaint is cirrhosis  He feels very well from a liver perspective. No swelling in his legs, no ascites, no overt GI bleeding, no signs of hepatic encephalopathy.  He had a recent TEE that suggested his heart is functioning a bit better than it has previously with an ejection fraction of around 45% now.  He is still on chronic oxygen.   Weight down 12 pouinds since last visit 1-2 years ago.  ROS: complete GI ROS as described in HPI.  Constitutional:  No unintentional weight loss   Past Medical History:  Diagnosis Date  . Atrial fibrillation ([place])    AV Node ablation January, 2010, for rapid atrial fib  . Atrial septal defect    Closed with surgery January, 2010  . Automatic implantable cardioverter-defibrillator in situ    LV dysfunction and pacer needed for AV node lesion  . Cardiomyopathy    non-ischemic  . CHF (congestive heart failure) ([place])   . Chronic combined systolic and diastolic CHF (congestive heart failure) ([place])   . Colon polyps   . COPD (chronic obstructive pulmonary disease) (HCC)    O2- 2 liters, nasal cannula, q night   . COPD GOLD II 01/11/2007   PFT's 09/25/13  FEV1  2.18 (61%) ratio 64 no change p B2 and DLC0  32% corrects to 68% - trial off advair and  acei rec starting  08/23/2013     . CVA (cerebral vascular accident) ([place]) 2009   denies residual on 08/14/2013  . Diabetes mellitus without complication ([place])   . Dyslipidemia   . Dysrhythmia   . Ejection fraction < 50%   . Endocarditis    Bacterial, 2009  . Headache(784.0)    related to stroke only  . HLD (hyperlipidemia)   . Hypertension   . Intracranial hemorrhage (HCC)    Coumadin cannot be used because of the history of his bleed  . Lung cancer ([place]) 11/29/2013   T1N0 Stage Ia non-small cell carcinoma left lung treated with wedge resection  . Mitral regurgitation    Severe symptomatic primary MR due to bacterial endocarditis, treated w/ MVR // Echo 1/18 EF 40-45, diffuse HK, dilated aorta at 42 mm/aortic root 47 mm; linear echogenic structure in ascending aorta - suspect reverberation artifact / consider CT or TEE to rule out dissection flap, bioprosthetic MVR with mean gradient 3, severe LAE, low normal RVSF, severe RAE, moderate TR, PASP 42  . Myocardial infarction 2010  . Pacemaker   . Permanent atrial fibrillation    Originally Coumadin use for atrial fibrillation  //   he had intracerebral hemorrhage with an INR of 2.3 June, 2009. Anticoagulation could no longer be used.  //  Rapid atrial fibrillation after inferior MI October, 2010..........Marland Kitchen AV node ablation done at that time  with ICD pacemaker placed (EF 35%).   //   Left atrial appendage tied off at the time of mitral valve surgery January, 2010 (maze pro  . Pneumonia    "this is my first case" (08/14/2013)  . Prosthetic valve dysfunction    Mild mitral stenosis  . Pulmonary hypertension    Moderate  . Renal artery stenosis (HCC)    Mild by history  . 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 Spontaneous pneumothorax    right thoracotomy - distant past  . Status post minimally invasive mitral valve replacement with bioprosthetic valve    33 mm Medtronic Mosaic porcine bioprosthesis placed via right mini thoracotomy for  bacterial endocarditis complicated by severe MR and CHF   . Thoracic aortic aneurysm ([place]) 08/11/2016   a - Chest CTA 1/18:  Aneurysmal dilatation of aortic root is noted at 5.1 cm.     Past Surgical History:  Procedure Laterality Date  . APPENDECTOMY    . ASD REPAIR, SECUNDUM  07/17/2008   pericardial patch closure of ASD  . AV NODE ABLATION  07/2008   for rapid atrial fib  . CARDIAC CATHETERIZATION    . CARDIAC DEFIBRILLATOR PLACEMENT  ~ [address] [facility]  . CARDIAC VALVE REPLACEMENT    . CATARACT EXTRACTION W/ INTRAOCULAR LENS  IMPLANT, BILATERAL Bilateral   . IMPLANTABLE CARDIOVERTER DEFIBRILLATOR (ICD) GENERATOR CHANGE N/A 02/06/2012   Procedure: ICD GENERATOR CHANGE;  Surgeon: [name], MD;  [facility];  Service: Cardiovascular;  Laterality: N/A;  . INSERT / REPLACE / REMOVE PACEMAKER    . LEEP    . MASS BIOPSY Left    neck mass  . MITRAL VALVE REPLACEMENT Right 07/17/2008   21m Medtronic Mosaic porcine bioprosthesis  . PENILE PROSTHESIS IMPLANT    . RIGHT HEART CATHETERIZATION N/A 08/16/2013   Procedure: RIGHT HEART CATH;  Surgeon: PJosue Hector MD;  [facility];  Service: Cardiovascular;  Laterality: N/A;  . TEE WITHOUT CARDIOVERSION N/A 09/06/2016   Procedure: TRANSESOPHAGEAL ECHOCARDIOGRAM (TEE);  Surgeon: KDorothy Spark MD;  [facility];  Service: Cardiovascular;  Laterality: N/A;  . THORACOTOMY Right 1970's   [place]    . VIDEO ASSISTED THORACOSCOPY (VATS)/WEDGE RESECTION Left 11/29/2013   Procedure: Video assisted thoracoscopy for wedge resection; mini thoracotomy;  Surgeon: CRexene Alberts MD;  [facility];  Service: Thoracic;  Laterality: Left;    Current Outpatient Prescriptions  Medication Sig Dispense Refill  . aspirin EC 81 MG tablet Take 1 tablet (81 mg total) by mouth daily. 90 tablet 3  . atorvastatin (LIPITOR) 10 MG tablet TAKE 1 TABLET BY MOUTH EVERY DAY 90  tablet 2  . budesonide-formoterol (SYMBICORT) 160-4.5 MCG/ACT inhaler INHALE 2 PUFFS INTO THE LUNGS TWICE A DAY 10.2 Inhaler 5  . carvedilol (COREG) 3.125 MG tablet TAKE 1 TABLET BY MOUTH TWICE A DAY WITH MEALS 180 tablet 1  . furosemide (LASIX) 40 MG tablet Take 40 mg by mouth daily. May take 1 extra daily as needed for edema    . irbesartan (AVAPRO) 75 MG tablet TAKE 1 TABLET BY MOUTH EVERY DAY 90 tablet 1  . metFORMIN (GLUCOPHAGE) 500 MG tablet 1,000 mg 2 (two) times daily with a meal. TAKE 1 (ONE) TABLET BY MOUTH IN THE MORNING WITH BREAKFAST AND 1/2 TABLET BY MOUTH WITH EVENING MEAL     . OXYGEN Inhale 2 L/min into the lungs continuous.    .Marland Kitchen  nitroGLYCERIN (NITROSTAT) 0.4 MG SL tablet Place 0.4 mg under the tongue every 5 (five) minutes as needed for chest pain (x 3 doses). Reported on 12/29/2015     No current facility-administered medications for this visit.     Allergies as of 09/20/2016 - Review Complete 09/20/2016  Allergen Reaction Noted  . Anticoagulant compound Other (See Comments) 08/12/2013  . Warfarin sodium Other (See Comments)     Family History  Problem Relation Age of Onset  . Stomach cancer Father   . Stroke Mother     Social History   Social History  . Marital status: Married    Spouse name: [name]  . Number of children: 0  . Years of education: College   Occupational History  . Retired Retired    Tour manager   Social History Main Topics  . Smoking status: Former Smoker    Packs/day: 1.00    Years: 45.00    Types: Cigarettes    Quit date: 08/11/2013  . Smokeless tobacco: Never Used     Comment: 08/14/2013 "quit smoking in 2009"  . Alcohol use No     Comment: 08/14/2013 "used to drink beer; quit:in 1982"  . Drug use: No  . Sexual activity: Yes   Other Topics Concern  . Not on file   Social History Narrative   Patient lives at home with his spouse.   Caffeine Use: none   Worked for the post office   Has two boys and a girl. All live local.       Physical Exam: BP (!) 100/50 (BP Location: Left Arm, Patient Position: Sitting, Cuff Size: Normal)   Pulse 72   Ht 6' (1.829 m)   Wt 198 lb 8 oz (90 kg)   BMI 26.92 kg/m  Constitutional: generally Ill appearing, oxygen tank at his side Psychiatric: alert and oriented x3 Abdomen: soft, nontender, nondistended, no obvious ascites, no peritoneal signs, normal bowel sounds No peripheral edema noted in lower extremities  Assessment and plan: 71 y.o. male with cirrhosis well compensated  His cirrhosis is likely from long-standing alcohol abuse. His last drink was many years ago fortunately. He is up-to-date on hepatoma screening. I'm going to restage his liver disease with CBC, coags and complete metabolic profile. At his last visit I deferred screening for esophageal varices given his overall poor health and multiple significant comorbidities. If she looks a bit better today and his ejection fraction is improved and so I think it is reasonable to screen him for esophageal varices with EGD at his soonest convenience. He is due for colon cancer screening around now I think I'm going to defer on that given his multiple comorbidities.  Please see the "Patient Instructions" section for addition details about the plan.  Owens Loffler, MD [facility] 09/20/2016, 10:49 AM

## 2016-09-29 NOTE — Transfer of Care (Signed)
Immediate Anesthesia Transfer of Care Note  Patient: [name]  Procedure(s) Performed: Procedure(s): ESOPHAGOGASTRODUODENOSCOPY (EGD) WITH PROPOFOL (N/A)  Patient Location: PACU and Endoscopy Unit  Anesthesia Type:MAC  Level of Consciousness: awake and patient cooperative  Airway & Oxygen Therapy: Patient Spontanous Breathing and Patient connected to nasal cannula oxygen  Post-op Assessment: Report given to RN and Post -op Vital signs reviewed and stable  Post vital signs: Reviewed and stable  Last Vitals:  Vitals:   09/29/16 0804  BP: 125/70  Pulse: 78  Resp: 18  Temp: 36.6 C    Last Pain:  Vitals:   09/29/16 0804  TempSrc: Oral         Complications: No apparent anesthesia complications

## 2016-09-29 NOTE — Interval H&P Note (Signed)
History and Physical Interval Note:  09/29/2016 7:19 AM  [name]  has presented today for surgery, with the diagnosis of cirrhosis  The various methods of treatment have been discussed with the patient and family. After consideration of risks, benefits and other options for treatment, the patient has consented to  Procedure(s): ESOPHAGOGASTRODUODENOSCOPY (EGD) WITH PROPOFOL (N/A) as a surgical intervention .  The patient's history has been reviewed, patient examined, no change in status, stable for surgery.  I have reviewed the patient's chart and labs.  Questions were answered to the patient's satisfaction.     [name]

## 2016-09-29 NOTE — Discharge Instructions (Signed)

## 2016-09-29 NOTE — Anesthesia Procedure Notes (Signed)
Procedure Name: MAC Date/Time: 09/29/2016 8:24 AM Performed by: Dione Booze Pre-anesthesia Checklist: Patient identified, Suction available, Emergency Drugs available and Patient being monitored Patient Re-evaluated:Patient Re-evaluated prior to inductionOxygen Delivery Method: Nasal cannula Placement Confirmation: positive ETCO2

## 2016-09-29 NOTE — Anesthesia Postprocedure Evaluation (Addendum)
Anesthesia Post Note  Patient: [name]  Procedure(s) Performed: Procedure(s) (LRB): ESOPHAGOGASTRODUODENOSCOPY (EGD) WITH PROPOFOL (N/A)  Patient location during evaluation: Endoscopy Anesthesia Type: MAC Level of consciousness: awake and alert Pain management: pain level controlled Vital Signs Assessment: post-procedure vital signs reviewed and stable Respiratory status: spontaneous breathing, nonlabored ventilation, respiratory function stable and patient connected to nasal cannula oxygen Cardiovascular status: stable and blood pressure returned to baseline Anesthetic complications: no       Last Vitals:  Vitals:   09/29/16 0900 09/29/16 0910  BP: 129/67 132/83  Pulse: 74 72  Resp: 18 19  Temp:      Last Pain:  Vitals:   09/29/16 0842  TempSrc: Oral                 Dexter Sauser,JAMES TERRILL

## 2016-09-29 NOTE — Op Note (Signed)
[facility] Patient Name: [name] Procedure Date: 09/29/2016 MRN: [id number] Attending MD: [name] , MD Date of Birth: Jul 15, 1945 CSN: [id number] Age: 71 Admit Type: Outpatient Procedure:                Upper GI endoscopy Indications:              Screening procedure; known cirrhosis, screening for                            esophageal varices Providers:                [name], MD, Laverta Baltimore RN, RN,                            Cherylynn Ridges, Technician, Dione Booze, CRNA Referring MD:              Medicines:                Monitored Anesthesia Care Complications:            No immediate complications. Estimated blood loss:                            None. Estimated Blood Loss:     Estimated blood loss: none. Procedure:                Pre-Anesthesia Assessment:                           - Prior to the procedure, a History and Physical                            was performed, and patient medications and                            allergies were reviewed. The patient's tolerance of                            previous anesthesia was also reviewed. The risks                            and benefits of the procedure and the sedation                            options and risks were discussed with the patient.                            All questions were answered, and informed consent                            was obtained. Prior Anticoagulants: The patient has                            taken no previous anticoagulant or antiplatelet                            agents.  ASA Grade Assessment: IV - A patient with                            severe systemic disease that is a constant threat                            to life. After reviewing the risks and benefits,                            the patient was deemed in satisfactory condition to                            undergo the procedure.                           After obtaining informed  consent, the endoscope was                            passed under direct vision. Throughout the                            procedure, the patient's blood pressure, pulse, and                            oxygen saturations were monitored continuously. The                            Endoscope was introduced through the mouth, and                            advanced to the second part of duodenum. The upper                            GI endoscopy was accomplished without difficulty.                            The patient tolerated the procedure well. Scope In: Scope Out: Findings:      The Z-line was irregular, non-nodular.      Moderate inflammation characterized by congestion (edema), erosions and       erythema was found in the gastric antrum. Biopsies were taken with a       cold forceps for histology.      A medium non-bleeding diverticulum was found in the second portion of       the duodenum.      The exam was otherwise without abnormality. Impression:               - Z-line irregular but non-nodular.                           - Gastritis. Biopsied.                           - Non-bleeding duodenal diverticulum.                           -  No signs of portal hypertension. Moderate Sedation:      N/A- Per Anesthesia Care Recommendation:           - Patient has a contact number available for                            emergencies. The signs and symptoms of potential                            delayed complications were discussed with the                            patient. Return to normal activities tomorrow.                            Written discharge instructions were provided to the                            patient.                           - Resume previous diet.                           - Continue present medications.                           - Follow up cirrhosis care with Dr. Ardis Hughs in 4-5                            months, my office will call to arrange that                             appointment. Procedure Code(s):        --- Professional ---                           [phone], Esophagogastroduodenoscopy, flexible,                            transoral; with biopsy, single or multiple Diagnosis Code(s):        --- Professional ---                           K22.8, Other specified diseases of esophagus                           K29.70, Gastritis, unspecified, without bleeding                           Z13.810, Encounter for screening for upper                            gastrointestinal disorder                           K57.10, Diverticulosis of small intestine without  perforation or abscess without bleeding CPT copyright 2016 American Medical Association. All rights reserved. The codes documented in this report are preliminary and upon coder review may  be revised to meet current compliance requirements. [name], MD 09/29/2016 8:40:32 AM This report has been signed electronically. Number of Addenda: 0

## 2016-10-02 ENCOUNTER — Encounter (HOSPITAL_COMMUNITY): Payer: Self-pay | Admitting: Gastroenterology

## 2016-10-12 ENCOUNTER — Other Ambulatory Visit: Payer: Self-pay | Admitting: Adult Health

## 2016-10-15 ENCOUNTER — Other Ambulatory Visit: Payer: Self-pay | Admitting: Adult Health

## 2016-10-18 ENCOUNTER — Other Ambulatory Visit: Payer: Self-pay | Admitting: Adult Health

## 2016-10-25 ENCOUNTER — Encounter: Payer: Self-pay | Admitting: Adult Health

## 2016-10-25 ENCOUNTER — Ambulatory Visit (INDEPENDENT_AMBULATORY_CARE_PROVIDER_SITE_OTHER): Payer: Medicare Other | Admitting: Adult Health

## 2016-10-25 VITALS — BP 112/78 | HR 73 | Temp 97.7°F | Ht 72.0 in | Wt 202.4 lb

## 2016-10-25 DIAGNOSIS — E119 Type 2 diabetes mellitus without complications: Secondary | ICD-10-CM | POA: Diagnosis not present

## 2016-10-25 LAB — POCT GLYCOSYLATED HEMOGLOBIN (HGB A1C): HEMOGLOBIN A1C: 7.7

## 2016-10-25 MED ORDER — METFORMIN HCL 1000 MG PO TABS: 1000.0000 mg | ORAL_TABLET | Freq: Two times a day (BID) | ORAL | 3 refills | Status: DC

## 2016-10-25 NOTE — Progress Notes (Signed)
Subjective:    Patient ID: [name], male    DOB: [birth year], 71 y.o.   MRN: [id number]  HPI  71 year old male who  has a past medical history of Atrial fibrillation ([place]); Atrial septal defect; Automatic implantable cardioverter-defibrillator in situ; Cardiomyopathy; CHF (congestive heart failure) ([place]); Chronic combined systolic and diastolic CHF (congestive heart failure) ([place]); Colon polyps; COPD (chronic obstructive pulmonary disease) ([place]); COPD GOLD II (01/11/2007); CVA (cerebral vascular accident) ([place]) (2009); Diabetes mellitus without complication ([place]); Dyslipidemia; Dysrhythmia; Ejection fraction < 50%; Endocarditis; Headache(784.0); HLD (hyperlipidemia); Hypertension; Intracranial hemorrhage ([place]); Lung cancer ([place]) (11/29/2013); Mitral regurgitation; Myocardial infarction ([place]) (2010); Pacemaker; Permanent atrial fibrillation; Prosthetic valve dysfunction; Pulmonary hypertension ([place]); Renal artery stenosis ([place]); 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 He present so the office today for three month follow up regarding diabetes. During his last visit in January his A1c was 7.7. His Metformin was increased to '1000mg'$  BID. He was asked to cut back on carbs and sugars.   Today in the office he reports he has been feeling " really good". He denies changing his diet and it does nto appear as though he increased his Metformin from 500 mg BID to '1000mg'$  BID at home.   Wt Readings from Last 3 Encounters:  10/25/16 202 lb 6.4 oz (91.8 kg)  09/29/16 198 lb (89.8 kg)  09/20/16 198 lb 8 oz (90 kg)    Review of Systems See HPI   Past Medical History:  Diagnosis Date  . Atrial fibrillation ([place])    AV Node ablation January, 2010, for rapid atrial fib  . Atrial septal defect    Closed with surgery January, 2010  . Automatic implantable  cardioverter-defibrillator in situ    LV dysfunction and pacer needed for AV node lesion  . Cardiomyopathy    non-ischemic  . CHF (congestive heart failure) ([place])   . Chronic combined systolic and diastolic CHF (congestive heart failure) ([place])   . Colon polyps   . COPD (chronic obstructive pulmonary disease) ([facility])    O2- 2 liters, nasal cannula, q night   . COPD GOLD II 01/11/2007   PFT's 09/25/13  FEV1  2.18 (61%) ratio 64 no change p B2 and DLC0  32% corrects to 68% - trial off advair and acei rec starting  08/23/2013     . CVA (cerebral vascular accident) ([place]) 2009   denies residual on 08/14/2013  . Diabetes mellitus without complication ([place])    type 2  . Dyslipidemia   . Dysrhythmia   . Ejection fraction < 50%   . Endocarditis    Bacterial, 2009  . Headache(784.0)    related to stroke only  . HLD (hyperlipidemia)   . Hypertension   . Intracranial hemorrhage ([facility])    Coumadin cannot be used because of the history of his bleed  . Lung cancer ([place]) 11/29/2013   T1N0 Stage Ia non-small cell carcinoma left lung treated with wedge resection  . Mitral regurgitation    Severe symptomatic primary MR due to bacterial endocarditis, treated w/ MVR // Echo 1/18 EF 40-45, diffuse HK, dilated aorta at 42 mm/aortic root 47 mm; linear echogenic structure in ascending aorta - suspect reverberation artifact / consider CT or TEE to rule out dissection flap, bioprosthetic MVR with mean gradient 3, severe LAE, low normal RVSF, severe RAE, moderate TR, PASP 42  . Myocardial infarction ([place]) 2010  .  Pacemaker    combo pacer and icd  . Permanent atrial fibrillation    Originally Coumadin use for atrial fibrillation  //   he had intracerebral hemorrhage with an INR of 2.3 June, 2009. Anticoagulation could no longer be used.  //  Rapid atrial fibrillation after inferior MI October, 2010..........Marland Kitchen AV node ablation done at that time with ICD pacemaker placed (EF 35%).   //   Left atrial appendage tied off at  the time of mitral valve surgery January, 2010 (maze pro  . Prosthetic valve dysfunction    Mild mitral stenosis  . Pulmonary hypertension ([facility])    Moderate  . Renal artery stenosis ([facility])    Mild by history  . 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 Spontaneous pneumothorax    right thoracotomy - distant past  . Status post minimally invasive mitral valve replacement with bioprosthetic valve    33 mm Medtronic Mosaic porcine bioprosthesis placed via right mini thoracotomy for bacterial endocarditis complicated by severe MR and CHF   . Thoracic aortic aneurysm ([place]) 08/11/2016   a - Chest CTA 1/18:  Aneurysmal dilatation of aortic root is noted at 5.1 cm.     Social History   Social History  . Marital status: Married    Spouse name: [name]  . Number of children: 0  . Years of education: College   Occupational History  . Retired Retired    Tour manager   Social History Main Topics  . Smoking status: Former Smoker    Packs/day: 1.00    Years: 45.00    Types: Cigarettes    Quit date: 08/11/2013  . Smokeless tobacco: Never Used     Comment: 08/14/2013 "quit smoking in 2009"  . Alcohol use No     Comment: 08/14/2013 "used to drink beer; quit:in 1982"  . Drug use: No  . Sexual activity: Yes   Other Topics Concern  . Not on file   Social History Narrative   Patient lives at home with his spouse.   Caffeine Use: none   Worked for the post office   Has two boys and a girl. All live local.     Past Surgical History:  Procedure Laterality Date  . APPENDECTOMY    . ASD REPAIR, SECUNDUM  07/17/2008   pericardial patch closure of ASD  . AV NODE ABLATION  07/2008   for rapid atrial fib  . CARDIAC CATHETERIZATION    . CARDIAC DEFIBRILLATOR PLACEMENT  ~ [address] [place]  . CARDIAC VALVE REPLACEMENT    . CATARACT EXTRACTION W/ INTRAOCULAR LENS  IMPLANT, BILATERAL Bilateral   . ESOPHAGOGASTRODUODENOSCOPY (EGD) WITH PROPOFOL N/A 09/29/2016   Procedure: ESOPHAGOGASTRODUODENOSCOPY (EGD)  WITH PROPOFOL;  Surgeon: [name], MD;  Location: [facility] ENDOSCOPY;  Service: Endoscopy;  Laterality: N/A;  . HERNIA REPAIR    . IMPLANTABLE CARDIOVERTER DEFIBRILLATOR (ICD) GENERATOR CHANGE N/A 02/06/2012   Procedure: ICD GENERATOR CHANGE;  Surgeon: [name], MD;  Location[facility] C[facility] LAB;  Service: Cardiovascular;  Laterality: N/A;  . INSERT / REPLACE / REMOVE PACEMAKER    . MASS BIOPSY Left    neck mass  . MITRAL VALVE REPLACEMENT Right 07/17/2008   67m Medtronic Mosaic porcine bioprosthesis  . PENILE PROSTHESIS IMPLANT    . RIGHT HEART CATHETERIZATION N/A 08/16/2013   Procedure: RIGHT HEART CATH;  Surgeon: PJosue Hector MD;  [facility];  Service: Cardiovascular;  Laterality: N/A;  . TEE WITHOUT CARDIOVERSION N/A 09/06/2016  Procedure: TRANSESOPHAGEAL ECHOCARDIOGRAM (TEE);  Surgeon: Dorothy Spark, MD;  Location: [facility] ENDOSCOPY;  Service: Cardiovascular;  Laterality: N/A;  . THORACOTOMY Right 1970's   spontaneous pneumothorax - while in the [place]  . TOE SURGERY     left foot hammer toe  . TONSILLECTOMY    . VIDEO ASSISTED THORACOSCOPY (VATS)/WEDGE RESECTION Left 11/29/2013   Procedure: Video assisted thoracoscopy for wedge resection; mini thoracotomy;  Surgeon: [name], MD;  [facility]: [facility];  Service: Thoracic;  Laterality: Left;    Family History  Problem Relation Age of Onset  . Stomach cancer Father   . Stroke Mother     Allergies  Allergen Reactions  . Anticoagulant Compound Other (See Comments)    Pt had intracranial bleed, therefore all anticoagulation is contra-indicated per Dr. Ron Parker.  . Warfarin Sodium Other (See Comments)    Pt had intracranial bleed, therefore all anticoagulation is contra-indicated per Dr. Ron Parker.     Current Outpatient Prescriptions on File Prior to Visit  Medication Sig Dispense Refill  . acetaminophen (TYLENOL) 500 MG tablet Take 500-1,000 mg by mouth every 6 (six) hours as needed for moderate pain or headache.    Marland Kitchen  amoxicillin (AMOXIL) 500 MG capsule Take 2,000 mg by mouth as directed. 1 hour prior to dental procedures    . aspirin EC 81 MG tablet Take 1 tablet (81 mg total) by mouth daily. 90 tablet 3  . atorvastatin (LIPITOR) 10 MG tablet TAKE 1 TABLET BY MOUTH EVERY DAY (Patient taking differently: TAKE 1 TABLET BY MOUTH AT BEDTIME) 90 tablet 2  . budesonide-formoterol (SYMBICORT) 160-4.5 MCG/ACT inhaler INHALE 2 PUFFS INTO THE LUNGS TWICE A DAY 10.2 Inhaler 5  . carvedilol (COREG) 3.125 MG tablet TAKE 1 TABLET BY MOUTH TWICE A DAY WITH MEALS 180 tablet 1  . furosemide (LASIX) 40 MG tablet Take 40 mg by mouth daily. May take 1 extra daily as needed for edema    . irbesartan (AVAPRO) 75 MG tablet TAKE 1 TABLET BY MOUTH EVERY DAY 90 tablet 1  . nitroGLYCERIN (NITROSTAT) 0.4 MG SL tablet Place 0.4 mg under the tongue every 5 (five) minutes as needed for chest pain (x 3 doses). Reported on 12/29/2015    . OXYGEN Inhale 2 L/min into the lungs continuous.     No current facility-administered medications on file prior to visit.     BP 112/78 (BP Location: Left Arm, Patient Position: Sitting, Cuff Size: Large)   Pulse 73   Temp 97.7 F (36.5 C) (Oral)   Ht 6' (1.829 m)   Wt 202 lb 6.4 oz (91.8 kg)   SpO2 (!) 86% Comment: Pt is currently on 2L of O2  BMI 27.45 kg/m       Objective:   Physical Exam  Constitutional: He is oriented to person, place, and time. He appears well-developed and well-nourished. No distress.  Cardiovascular: Normal rate, regular rhythm and intact distal pulses.  Exam reveals no gallop and no friction rub.   No murmur heard. Pulmonary/Chest: Effort normal and breath sounds normal. No respiratory distress. He has no wheezes. He has no rales. He exhibits no tenderness.  Oxygen dependent. Has oxygen tank at side   Musculoskeletal: Normal range of motion.  Neurological: He is alert and oriented to person, place, and time.  Skin: Skin is warm and dry. No rash noted. He is not  diaphoretic. No erythema. No pallor.  Psychiatric: He has a normal mood and affect. His behavior is normal. Judgment and  thought content normal.  Nursing note and vitals reviewed.      Assessment & Plan:  1. Diabetes mellitus without complication ([place]) - POCT glycosylated hemoglobin (Hb A1C) - 7.7 - Has not changed. Will send in new prescription for Metformin '1000mg'$  BID.  - metFORMIN (GLUCOPHAGE) 1000 MG tablet; Take 1 tablet (1,000 mg total) by mouth 2 (two) times daily with a meal.  Dispense: 180 tablet; Refill: 3 - Will have him follow up in 3-6 months   [name], NP

## 2016-10-25 NOTE — Progress Notes (Signed)
Pre visit review using our clinic review tool, if applicable. No additional management support is needed unless otherwise documented below in the visit note. 

## 2016-11-16 ENCOUNTER — Other Ambulatory Visit: Payer: Self-pay | Admitting: Adult Health

## 2016-11-16 NOTE — Telephone Encounter (Signed)
Do not fill. During his last visit I increased Metfomrin to '1000mg'$  BID and sent in a new script for one year

## 2016-11-30 ENCOUNTER — Ambulatory Visit (INDEPENDENT_AMBULATORY_CARE_PROVIDER_SITE_OTHER): Payer: Medicare Other | Admitting: *Deleted

## 2016-11-30 DIAGNOSIS — I428 Other cardiomyopathies: Secondary | ICD-10-CM

## 2016-11-30 NOTE — Progress Notes (Signed)
Remote ICD transmission.   

## 2016-12-01 ENCOUNTER — Other Ambulatory Visit: Payer: Self-pay | Admitting: Adult Health

## 2016-12-01 ENCOUNTER — Encounter: Payer: Self-pay | Admitting: Cardiology

## 2016-12-01 LAB — CUP PACEART REMOTE DEVICE CHECK
Battery Remaining Longevity: 43 mo
Date Time Interrogation Session: 20180523080018
HIGH POWER IMPEDANCE MEASURED VALUE: 72 Ohm
HighPow Impedance: 72 Ohm
Implantable Lead Implant Date: 20100113
Implantable Lead Implant Date: 20100113
Implantable Lead Location: 753860
Implantable Pulse Generator Implant Date: 20130729
Lead Channel Impedance Value: 1075 Ohm
Lead Channel Pacing Threshold Amplitude: 1.125 V
Lead Channel Pacing Threshold Pulse Width: 0.5 ms
Lead Channel Pacing Threshold Pulse Width: 0.5 ms
Lead Channel Setting Sensing Sensitivity: 0.5 mV
MDC IDC LEAD LOCATION: 753858
MDC IDC MSMT BATTERY REMAINING PERCENTAGE: 47 %
MDC IDC MSMT BATTERY VOLTAGE: 2.92 V
MDC IDC MSMT LEADCHNL RV IMPEDANCE VALUE: 410 Ohm
MDC IDC MSMT LEADCHNL RV PACING THRESHOLD AMPLITUDE: 0.625 V
MDC IDC MSMT LEADCHNL RV SENSING INTR AMPL: 12 mV
MDC IDC PG SERIAL: 7053988
MDC IDC SET LEADCHNL LV PACING AMPLITUDE: 2.125
MDC IDC SET LEADCHNL LV PACING PULSEWIDTH: 0.5 ms
MDC IDC SET LEADCHNL RV PACING AMPLITUDE: 2 V
MDC IDC SET LEADCHNL RV PACING PULSEWIDTH: 0.5 ms

## 2016-12-01 NOTE — Telephone Encounter (Signed)
Ok to refill for 365 days

## 2016-12-04 ENCOUNTER — Other Ambulatory Visit: Payer: Self-pay | Admitting: Adult Health

## 2016-12-09 NOTE — Addendum Note (Signed)
Addendum  created 12/09/16 1248 by Rica Koyanagi, MD   Sign clinical note

## 2016-12-09 NOTE — Addendum Note (Signed)
Addendum  created 12/09/16 1207 by Rica Koyanagi, MD   Sign clinical note

## 2016-12-13 ENCOUNTER — Encounter: Payer: Self-pay | Admitting: Cardiology

## 2016-12-16 ENCOUNTER — Encounter: Payer: Self-pay | Admitting: Pulmonary Disease

## 2016-12-16 ENCOUNTER — Ambulatory Visit (INDEPENDENT_AMBULATORY_CARE_PROVIDER_SITE_OTHER): Payer: Medicare Other | Admitting: Pulmonary Disease

## 2016-12-16 DIAGNOSIS — J9611 Chronic respiratory failure with hypoxia: Secondary | ICD-10-CM | POA: Diagnosis not present

## 2016-12-16 DIAGNOSIS — J449 Chronic obstructive pulmonary disease, unspecified: Secondary | ICD-10-CM | POA: Diagnosis not present

## 2016-12-16 NOTE — Patient Instructions (Signed)
Stay on Symbicort. Call us as needed

## 2016-12-16 NOTE — Assessment & Plan Note (Signed)
DLCO decreased out of proportion to the FEV1 which may be related to pulmonary hypertension or emphysema  Continue 2 L oxygen He would be at Moderate risk for surgery for aortic aneurysm

## 2016-12-16 NOTE — Assessment & Plan Note (Signed)
Continue Symbicort twice daily  Some decline in lung function compared to 2015

## 2016-12-16 NOTE — Progress Notes (Signed)
   Subjective:    Patient ID: [name], male    DOB: Oct 13, 1945, 71 y.o.   MRN: [id number]  HPI  71 year old male smoker with ischemic cardiomyopathy for followup of COPD, hypoxia and lung cancer. He has a bioprosthetic mitral valve and moderate pulmonary hypertension based on right heart cath in 2015 He has a 40+ PY hx of smoking , quit 2015  11/2013 Underwent left VATS with wedge resection of the left upper lobe - Dr. Roxy Manns.-invasive well-differentiated adenocarcinoma , neg LNs, close margins but no involvement  CT angiogram to/2018 showed a 5.1 cm aortic root aneurysmal dilatation, he underwent detailed evaluation by Dr. Roxy Manns and presently is being monitored by serial CT  He is compliant with oxygen, denies cough, wheezing, sputum production   Significant tests/ events :  TEE 08/2016 EF 45%  PFTs 09/2013 - FEV1 2.18- 61%, ratio 64, FVC 3.40 -71%, DLCO 11.0- 32%  CPET 11/20/13 FEV1 2.28 -58%, MVV 105 (71%), peak V O2 12.7, moderate-to-severe functional limitation due primarily to a circulatory limitation     PFTs 08/2016 ratio 60, FEV1 52%, FVC 64%, DLCO 23%  Review of Systems  neg for any significant sore throat, dysphagia, itching, sneezing, nasal congestion or excess/ purulent secretions, fever, chills, sweats, unintended wt loss, pleuritic or exertional cp, hempoptysis, orthopnea pnd or change in chronic leg swelling. Also denies presyncope, palpitations, heartburn, abdominal pain, nausea, vomiting, diarrhea or change in bowel or urinary habits, dysuria,hematuria, rash, arthralgias, visual complaints, headache, numbness weakness or ataxia.     Objective:   Physical Exam  Gen. Pleasant, well-nourished, in no distress ENT - no thrush, no post nasal drip Neck: No JVD, no thyromegaly, no carotid bruits Lungs: no use of accessory muscles, no dullness to percussion, decreased without rales or rhonchi  Cardiovascular: Rhythm regular, heart sounds  normal, no murmurs or  gallops, no peripheral edema Musculoskeletal: No deformities, no cyanosis or clubbing        Assessment & Plan:

## 2016-12-20 ENCOUNTER — Other Ambulatory Visit (HOSPITAL_BASED_OUTPATIENT_CLINIC_OR_DEPARTMENT_OTHER): Payer: Medicare Other

## 2016-12-20 DIAGNOSIS — C3412 Malignant neoplasm of upper lobe, left bronchus or lung: Secondary | ICD-10-CM | POA: Diagnosis present

## 2016-12-20 LAB — COMPREHENSIVE METABOLIC PANEL
ALBUMIN: 4.1 g/dL (ref 3.5–5.0)
ALK PHOS: 64 U/L (ref 40–150)
ALT: 8 U/L (ref 0–55)
ANION GAP: 10 meq/L (ref 3–11)
AST: 11 U/L (ref 5–34)
BILIRUBIN TOTAL: 3.01 mg/dL — AB (ref 0.20–1.20)
BUN: 20.6 mg/dL (ref 7.0–26.0)
CALCIUM: 10 mg/dL (ref 8.4–10.4)
CO2: 29 mEq/L (ref 22–29)
Chloride: 101 mEq/L (ref 98–109)
Creatinine: 1.2 mg/dL (ref 0.7–1.3)
EGFR: 64 mL/min/{1.73_m2} — AB (ref >90)
Glucose: 166 mg/dl — ABNORMAL HIGH (ref 70–140)
POTASSIUM: 5.1 meq/L (ref 3.5–5.1)
Sodium: 140 mEq/L (ref 136–145)
TOTAL PROTEIN: 7.1 g/dL (ref 6.4–8.3)

## 2016-12-20 LAB — CBC WITH DIFFERENTIAL/PLATELET
BASO%: 0.3 % (ref 0.0–2.0)
BASOS ABS: 0 10*3/uL (ref 0.0–0.1)
EOS ABS: 0.2 10*3/uL (ref 0.0–0.5)
EOS%: 1.9 % (ref 0.0–7.0)
HEMATOCRIT: 49.3 % (ref 38.4–49.9)
HEMOGLOBIN: 15.6 g/dL (ref 13.0–17.1)
LYMPH%: 10.3 % — ABNORMAL LOW (ref 14.0–49.0)
MCH: 30.5 pg (ref 27.2–33.4)
MCHC: 31.6 g/dL — ABNORMAL LOW (ref 32.0–36.0)
MCV: 96.5 fL (ref 79.3–98.0)
MONO#: 0.7 10*3/uL (ref 0.1–0.9)
MONO%: 8.4 % (ref 0.0–14.0)
NEUT%: 79.1 % — ABNORMAL HIGH (ref 39.0–75.0)
NEUTROS ABS: 6.3 10*3/uL (ref 1.5–6.5)
PLATELETS: 151 10*3/uL (ref 140–400)
RBC: 5.11 10*6/uL (ref 4.20–5.82)
RDW: 15.2 % — AB (ref 11.0–14.6)
WBC: 7.9 10*3/uL (ref 4.0–10.3)
lymph#: 0.8 10*3/uL — ABNORMAL LOW (ref 0.9–3.3)

## 2016-12-21 ENCOUNTER — Encounter (HOSPITAL_COMMUNITY): Payer: Self-pay

## 2016-12-21 ENCOUNTER — Ambulatory Visit (HOSPITAL_COMMUNITY)
Admission: RE | Admit: 2016-12-21 | Discharge: 2016-12-21 | Disposition: A | Discharge status: Home / Self Care | Payer: Medicare Other | Source: Ambulatory Visit | Attending: Internal Medicine | Admitting: Internal Medicine

## 2016-12-21 DIAGNOSIS — I700 Atherosclerosis of aorta: Secondary | ICD-10-CM | POA: Diagnosis not present

## 2016-12-21 DIAGNOSIS — C3412 Malignant neoplasm of upper lobe, left bronchus or lung: Secondary | ICD-10-CM | POA: Insufficient documentation

## 2016-12-21 DIAGNOSIS — J439 Emphysema, unspecified: Secondary | ICD-10-CM | POA: Insufficient documentation

## 2016-12-21 DIAGNOSIS — C3490 Malignant neoplasm of unspecified part of unspecified bronchus or lung: Secondary | ICD-10-CM | POA: Diagnosis not present

## 2016-12-27 ENCOUNTER — Encounter: Payer: Self-pay | Admitting: Internal Medicine

## 2016-12-27 ENCOUNTER — Ambulatory Visit: Payer: Medicare Other | Admitting: Internal Medicine

## 2016-12-27 ENCOUNTER — Ambulatory Visit (HOSPITAL_BASED_OUTPATIENT_CLINIC_OR_DEPARTMENT_OTHER): Payer: Medicare Other | Admitting: Internal Medicine

## 2016-12-27 ENCOUNTER — Telehealth: Payer: Self-pay | Admitting: Internal Medicine

## 2016-12-27 VITALS — BP 122/61 | HR 77 | Temp 97.7°F | Resp 19 | Ht 74.0 in | Wt 199.8 lb

## 2016-12-27 DIAGNOSIS — J449 Chronic obstructive pulmonary disease, unspecified: Secondary | ICD-10-CM

## 2016-12-27 DIAGNOSIS — Z85118 Personal history of other malignant neoplasm of bronchus and lung: Secondary | ICD-10-CM

## 2016-12-27 DIAGNOSIS — C3412 Malignant neoplasm of upper lobe, left bronchus or lung: Secondary | ICD-10-CM

## 2016-12-27 NOTE — Telephone Encounter (Signed)
Appointments scheduled per 12/27/16 los. Patient was given a copy of the AVS report and appointment schedule, per 06/19/18los.

## 2016-12-27 NOTE — Progress Notes (Signed)
[name] [phone]   [phone]  OFFICE PROGRESS NOTE  Charles Peng, NP [place] [address]  DIAGNOSIS: Stage IA (T1a, N0, M0) non-small cell lung cancer, well-differentiated adenocarcinoma of the left upper lobe diagnosed in February of 2015   PRIOR THERAPY: Status post wedge resection of the left upper lobe under the care of Dr. Roxy Manns on 11/29/2013.  CURRENT THERAPY: Observation.  INTERVAL HISTORY: [name] 71 y.o. male returns to the clinic today for follow-up visit. The patient is feeling fine today with no specific complaints except for the baseline shortness of breath and he is currently on home oxygen. He is followed by Dr. Elsworth Soho for his COPD. He denied having any chest pain, cough or hemoptysis. He has no fever or chills. He denied having any nausea, vomiting, diarrhea or constipation. He has no weight loss or night sweats. He had repeat CT scan of the chest performed recently and he is here for evaluation and discussion of his scan results.   MEDICAL HISTORY: Past Medical History:  Diagnosis Date  . Atrial fibrillation ([place])    AV Node ablation January, 2010, for rapid atrial fib  . Atrial septal defect    Closed with surgery January, 2010  . Automatic implantable cardioverter-defibrillator in situ    LV dysfunction and pacer needed for AV node lesion  . Cardiomyopathy    non-ischemic  . CHF (congestive heart failure) ([place])   . Chronic combined systolic and diastolic CHF (congestive heart failure) ([place])   . Colon polyps   . COPD (chronic obstructive pulmonary disease) (HCC)    O2- 2 liters, nasal cannula, q night   . COPD GOLD II 01/11/2007   PFT's 09/25/13  FEV1  2.18 (61%) ratio 64 no change p B2 and DLC0  32% corrects to 68% - trial off advair and acei rec starting  08/23/2013     . CVA (cerebral vascular accident) ([place]) 2009   denies residual on 08/14/2013  . Diabetes mellitus without complication  ([place])    type 2  . Dyslipidemia   . Dysrhythmia   . Ejection fraction < 50%   . Endocarditis    Bacterial, 2009  . Headache(784.0)    related to stroke only  . HLD (hyperlipidemia)   . Hypertension   . Intracranial hemorrhage (HCC)    Coumadin cannot be used because of the history of his bleed  . Lung cancer ([place]) 11/29/2013   T1N0 Stage Ia non-small cell carcinoma left lung treated with wedge resection  . Mitral regurgitation    Severe symptomatic primary MR due to bacterial endocarditis, treated w/ MVR // Echo 1/18 EF 40-45, diffuse HK, dilated aorta at 42 mm/aortic root 47 mm; linear echogenic structure in ascending aorta - suspect reverberation artifact / consider CT or TEE to rule out dissection flap, bioprosthetic MVR with mean gradient 3, severe LAE, low normal RVSF, severe RAE, moderate TR, PASP 42  . Myocardial infarction ([facility]) 2010  . Pacemaker    combo pacer and icd  . Permanent atrial fibrillation    Originally Coumadin use for atrial fibrillation  //   he had intracerebral hemorrhage with an INR of 2.3 June, 2009. Anticoagulation could no longer be used.  //  Rapid atrial fibrillation after inferior MI October, 2010..........Marland Kitchen AV node ablation done at that time with ICD pacemaker placed (EF 35%).   //   Left atrial appendage tied off at the time of  mitral valve surgery January, 2010 (maze pro  . Prosthetic valve dysfunction    Mild mitral stenosis  . Pulmonary hypertension (HCC)    Moderate  . Renal artery stenosis (HCC)    Mild by history  . 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 Spontaneous pneumothorax    right thoracotomy - distant past  . Status post minimally invasive mitral valve replacement with bioprosthetic valve    33 mm Medtronic Mosaic porcine bioprosthesis placed via right mini thoracotomy for bacterial endocarditis complicated by severe MR and CHF   . Thoracic aortic aneurysm ([place]) 08/11/2016   a - Chest CTA 1/18:  Aneurysmal dilatation of aortic root is  noted at 5.1 cm.     ALLERGIES:  is allergic to anticoagulant compound and warfarin sodium.  MEDICATIONS:  Current Outpatient Prescriptions  Medication Sig Dispense Refill  . acetaminophen (TYLENOL) 500 MG tablet Take 500-1,000 mg by mouth every 6 (six) hours as needed for moderate pain or headache.    Marland Kitchen aspirin EC 81 MG tablet Take 1 tablet (81 mg total) by mouth daily. 90 tablet 3  . atorvastatin (LIPITOR) 10 MG tablet TAKE 1 TABLET BY MOUTH EVERY DAY (Patient taking differently: TAKE 1 TABLET BY MOUTH AT BEDTIME) 90 tablet 2  . budesonide-formoterol (SYMBICORT) 160-4.5 MCG/ACT inhaler INHALE 2 PUFFS INTO THE LUNGS TWICE A DAY 10.2 Inhaler 5  . carvedilol (COREG) 3.125 MG tablet TAKE 1 TABLET BY MOUTH TWICE A DAY WITH MEALS 180 tablet 1  . furosemide (LASIX) 40 MG tablet Take 40 mg by mouth daily. May take 1 extra daily as needed for edema    . irbesartan (AVAPRO) 75 MG tablet TAKE 1 TABLET BY MOUTH EVERY DAY 90 tablet 3  . metFORMIN (GLUCOPHAGE) 1000 MG tablet Take 1 tablet (1,000 mg total) by mouth 2 (two) times daily with a meal. 180 tablet 3  . OXYGEN Inhale 2 L/min into the lungs continuous.    Marland Kitchen amoxicillin (AMOXIL) 500 MG capsule Take 2,000 mg by mouth as directed. 1 hour prior to dental procedures    . nitroGLYCERIN (NITROSTAT) 0.4 MG SL tablet Place 0.4 mg under the tongue every 5 (five) minutes as needed for chest pain (x 3 doses). Reported on 12/29/2015     No current facility-administered medications for this visit.     SURGICAL HISTORY:  Past Surgical History:  Procedure Laterality Date  . APPENDECTOMY    . ASD REPAIR, SECUNDUM  07/17/2008   pericardial patch closure of ASD  . AV NODE ABLATION  07/2008   for rapid atrial fib  . CARDIAC CATHETERIZATION    . CARDIAC DEFIBRILLATOR PLACEMENT  ~ [address] [employer]  . CARDIAC VALVE REPLACEMENT    . CATARACT EXTRACTION W/ INTRAOCULAR LENS  IMPLANT, BILATERAL Bilateral   . ESOPHAGOGASTRODUODENOSCOPY (EGD) WITH PROPOFOL N/A  09/29/2016   Procedure: ESOPHAGOGASTRODUODENOSCOPY (EGD) WITH PROPOFOL;  Surgeon: [name], MD;  Location: [facility] ENDOSCOPY;  Service: Endoscopy;  Laterality: N/A;  . HERNIA REPAIR    . IMPLANTABLE CARDIOVERTER DEFIBRILLATOR (ICD) GENERATOR CHANGE N/A 02/06/2012   Procedure: ICD GENERATOR CHANGE;  Surgeon: [name], MD;  Location: [facility] CATH LAB;  Service: Cardiovascular;  Laterality: N/A;  . INSERT / REPLACE / REMOVE PACEMAKER    . MASS BIOPSY Left    neck mass  . MITRAL VALVE REPLACEMENT Right 07/17/2008   67mm Medtronic Mosaic porcine bioprosthesis  . PENILE PROSTHESIS IMPLANT    . RIGHT HEART CATHETERIZATION N/A 08/16/2013   Procedure:  RIGHT HEART CATH;  Surgeon: Josue Hector, MD;  Location: [facility] CATH LAB;  Service: Cardiovascular;  Laterality: N/A;  . TEE WITHOUT CARDIOVERSION N/A 09/06/2016   Procedure: TRANSESOPHAGEAL ECHOCARDIOGRAM (TEE);  Surgeon: Dorothy Spark, MD;  Location: [facility] ENDOSCOPY;  Service: Cardiovascular;  Laterality: N/A;  . THORACOTOMY Right 1970's   spontaneous pneumothorax - while in the [place]  . TOE SURGERY     left foot hammer toe  . TONSILLECTOMY    . VIDEO ASSISTED THORACOSCOPY (VATS)/WEDGE RESECTION Left 11/29/2013   Procedure: Video assisted thoracoscopy for wedge resection; mini thoracotomy;  Surgeon: [name], MD;  Location: [facility];  Service: Thoracic;  Laterality: Left;    REVIEW OF SYSTEMS:  A comprehensive review of systems was negative except for: Respiratory: positive for dyspnea on exertion   PHYSICAL EXAMINATION: General appearance: alert, cooperative, fatigued and no distress Head: Normocephalic, without obvious abnormality, atraumatic Neck: no adenopathy, no JVD, supple, symmetrical, trachea midline and thyroid not enlarged, symmetric, no tenderness/mass/nodules Lymph nodes: Cervical, supraclavicular, and axillary nodes normal. Resp: clear to auscultation bilaterally Back: negative, symmetric, no curvature. ROM normal. No CVA  tenderness. Cardio: regular rate and rhythm, S1, S2 normal, no murmur, click, rub or gallop GI: soft, non-tender; bowel sounds normal; no masses,  no organomegaly Extremities: extremities normal, atraumatic, no cyanosis or edema  ECOG PERFORMANCE STATUS: 1 - Symptomatic but completely ambulatory  Blood pressure 122/61, pulse 77, temperature 97.7 F (36.5 C), temperature source Oral, resp. rate 19, height 6\' 2"  (1.88 m), weight 199 lb 12.8 oz (90.6 kg), SpO2 91 %.  LABORATORY DATA: Lab Results  Component Value Date   WBC 7.9 12/20/2016   HGB 15.6 12/20/2016   HCT 49.3 12/20/2016   MCV 96.5 12/20/2016   PLT 151 12/20/2016      Chemistry      Component Value Date/Time   NA 140 12/20/2016 0746   K 5.1 12/20/2016 0746   CL 102 09/29/2016 0727   CO2 29 12/20/2016 0746   BUN 20.6 12/20/2016 0746   CREATININE 1.2 12/20/2016 0746      Component Value Date/Time   CALCIUM 10.0 12/20/2016 0746   ALKPHOS 64 12/20/2016 0746   AST 11 12/20/2016 0746   ALT 8 12/20/2016 0746   BILITOT 3.01 (H) 12/20/2016 0746       RADIOGRAPHIC STUDIES: Ct Chest Wo Contrast  Result Date: 12/21/2016 CLINICAL DATA:  Restaging left lung cancer. Initial diagnosis 2014. Status post partial left upper lobe lobectomy. EXAM: CT CHEST WITHOUT CONTRAST TECHNIQUE: Multidetector CT imaging of the chest was performed following the standard protocol without IV contrast. COMPARISON:  CT chest 08/12/2015 FINDINGS: Cardiovascular: The heart is enlarged but appears stable. Multi chamber enlargement but most notably the right and left atria. Stable mild tortuosity and atherosclerotic calcifications involving the abdominal aorta. Stable three-vessel coronary artery calcifications. Stable enlarged pulmonary artery suggesting pulmonary hypertension. Mediastinum/Nodes: No mediastinal or hilar mass or lymphadenopathy. Persistent borderline enlarged aorticopulmonary window and subcarinal lymph nodes. Largest subcarinal node  measures 13.5 mm on image number 77. This measures 12.5 mm on the prior study. The esophagus is grossly normal. Lungs/Pleura: Stable advanced emphysematous changes and pulmonary scarring. Stable surgical scarring changes involving the left upper lobe related to a prior partial lobectomy/ wedge resection. No findings for recurrent tumor. No worrisome pulmonary nodules to suggest pulmonary metastatic disease. Stable 2 mm left upper lobe pulmonary nodule on image number 52. No acute overlying pulmonary findings. No pleural effusion. Stable clustered peripheral left lower  lobe parenchymal calcifications and small scattered calcified granulomas. Upper Abdomen: No significant upper abdominal findings. Irregular liver contour or may suggest early changes of cirrhosis. No adrenal gland or hepatic metastatic lesions are identified. Stable abdominal aortic calcifications. Chest wall/ Musculoskeletal: No chest wall lesions are identified. Stable left-sided gynecomastia. Stable right-sided permanent pacemaker. No supraclavicular or axillary adenopathy. No significant bony findings. IMPRESSION: 1. Stable surgical changes involving the left upper lobe. No evidence of recurrent tumor. 2. Stable borderline enlarged mediastinal lymph nodes. 3. Stable cardiac enlargement most notably marked enlargement of the right atrium and left atrium. 4. Enlarged pulmonary artery suggesting pulmonary hypertension. 5. Stable advanced emphysematous changes, pulmonary scarring and tiny pulmonary nodules. 6. No acute overlying pulmonary process. Aortic Atherosclerosis (ICD10-I70.0) and Emphysema (ICD10-J43.9). Electronically Signed   By: Marijo Sanes M.D.   On: 12/21/2016 10:06    ASSESSMENT AND PLAN:  This is a very pleasant 71 years old white male with a stage IA non-small cell lung cancer status post wedge resection of the left upper lobe in 2015. The patient is currently on observation. His recent CT scan of the chest showed no evidence  for disease recurrence. I discussed the scan results with the patient today and recommended for him to continue on observation with repeat CT scan of the chest and lab work in one year. For COPD, he will continue his routine follow-up visit by Dr. Elsworth Soho. He was advised to call immediately if he has any concerning symptoms in the interval. The patient voices understanding of current disease status and treatment options and is in agreement with the current care plan.  All questions were answered. The patient knows to call the clinic with any problems, questions or concerns. We can certainly see the patient much sooner if necessary.  Disclaimer: This note was dictated with voice recognition software. Similar sounding words can inadvertently be transcribed and may not be corrected upon review.

## 2016-12-28 ENCOUNTER — Ambulatory Visit (INDEPENDENT_AMBULATORY_CARE_PROVIDER_SITE_OTHER): Payer: Medicare Other | Admitting: Nurse Practitioner

## 2016-12-28 ENCOUNTER — Encounter: Payer: Self-pay | Admitting: Nurse Practitioner

## 2016-12-28 VITALS — BP 104/50 | HR 74 | Ht 73.0 in | Wt 201.0 lb

## 2016-12-28 DIAGNOSIS — I428 Other cardiomyopathies: Secondary | ICD-10-CM

## 2016-12-28 NOTE — Progress Notes (Signed)
CARDIOLOGY OFFICE NOTE  Date:  12/28/2016    [name] Date of Birth: April 15, 1946 Medical Record #[id number]  PCP:  [name], NP  Cardiologist:  [facility]  Chief Complaint  Patient presents with  . Cardiomyopathy    Follow up visit - seen for Dr. Tamala Julian    History of Present Illness: EPIMENIO SCHETTER is a 71 y.o. male who presents today for a 6 month check. Seen for Dr. Marlou Porch.  He has a hx of mitral regurgitation due to endocarditis s/p bioprosthetic MVR, permanent AF, intracerebral hemorrhage in 2009 (anticoagulation stopped), systolic CHF, NICM with EF 35%, inferior MI in 2009, prior CVA, COPD on chronic O2, Lung CA s/p wedge resection - followed by Dr. Earlie Server with oncology, pulmonary HTN (Dr. Elsworth Soho).  He underwent AV node ablation due uncontrolled atrial fibrillation in 2010 with placement of BiV-ICD.  He also underwent LA appendage clipping at the time of his MV surgery.  LHC prior to MVR demonstrated normal coronaries (etiology of MI in 2009 unknown).    Admitted in 3/17 for near syncope related to orthostatic hypotension.    Last seen by Dr. Candee Furbish in 6/17.  Saw [name], [place] back in December - he was doing well. His echo was updated - this led to having a CT scan due to possible aneurysm - ended up getting referred back to Dr. Roxy Manns - he notes that the "right coronary artery appears to be displaced in a cephalad direction and originating from the true lumen of the aorta. The aortic valve itself is trileaflet and functioning normally. All of these findings are not likely to be consistent with a chronic localized dissection, and under the circumstances it seems likely that this false aneurysm developed as a complication of the patient's previous endocarditis. The false aneurysm is nowhere near the patient's mitral valve prosthesis and therefore probably not related to his surgery.  It has clearly been present since 2015 when the patient first underwent non-gated CT  scan of the chest.  It is unclear whether or not the false aneurysm may be enlarging. The patient is noted to have severely reduced diffusion capacity with chronic hypoxemia at baseline for which he is oxygen dependent.  He did very poorly with his 6 minute walk test and had to stop in less than 3 minutes due to severe hypoxemia during ambulation despite the fact that he was ambulating using continuous oxygen at 2 L/m.  There is no question that with any major surgical procedure the patient would be at very high risk for the development of postoperative respiratory failure."  It has been elected to rescan him again in September with follow up with Dr. Roxy Manns afterwards.   Comes in today. Here alone. He says he is doing ok. Remains on his oxygen all the time. Breathing is stable. Saw oncology yesterday and got a good report. No chest pain. Not dizzy or lightheaded. No syncope. Tells me he [place] able [place] three [place] about [place] other day and walks his dog. He feels like he is doing ok and has no real concerns today.   Past Medical History:  Diagnosis Date  . Atrial fibrillation ([place])    AV Node ablation January, 2010, for rapid atrial fib  . Atrial septal defect    Closed with surgery January, 2010  . Automatic implantable cardioverter-defibrillator in situ    LV dysfunction and pacer needed for AV node lesion  . Cardiomyopathy  non-ischemic  . CHF (congestive heart failure) ([place])   . Chronic combined systolic and diastolic CHF (congestive heart failure) ([place])   . Colon polyps   . COPD (chronic obstructive pulmonary disease) (HCC)    O2- 2 liters, nasal cannula, q night   . COPD GOLD II 01/11/2007   PFT's 09/25/13  FEV1  2.18 (61%) ratio 64 no change p B2 and DLC0  32% corrects to 68% - trial off advair and acei rec starting  08/23/2013     . CVA (cerebral vascular accident) ([place]) 2009   denies residual on 08/14/2013  . Diabetes mellitus without complication ([place])    type 2  .  Dyslipidemia   . Dysrhythmia   . Ejection fraction < 50%   . Endocarditis    Bacterial, 2009  . Headache(784.0)    related to stroke only  . HLD (hyperlipidemia)   . Hypertension   . Intracranial hemorrhage (HCC)    Coumadin cannot be used because of the history of his bleed  . Lung cancer ([place]) 11/29/2013   T1N0 Stage Ia non-small cell carcinoma left lung treated with wedge resection  . Mitral regurgitation    Severe symptomatic primary MR due to bacterial endocarditis, treated w/ MVR // Echo 1/18 EF 40-45, diffuse HK, dilated aorta at 42 mm/aortic root 47 mm; linear echogenic structure in ascending aorta - suspect reverberation artifact / consider CT or TEE to rule out dissection flap, bioprosthetic MVR with mean gradient 3, severe LAE, low normal RVSF, severe RAE, moderate TR, PASP 42  . Myocardial infarction ([place]) 2010  . Pacemaker    combo pacer and icd  . Permanent atrial fibrillation    Originally Coumadin use for atrial fibrillation  //   he had intracerebral hemorrhage with an INR of 2.3 June, 2009. Anticoagulation could no longer be used.  //  Rapid atrial fibrillation after inferior MI October, 2010..........Marland Kitchen AV node ablation done at that time with ICD pacemaker placed (EF 35%).   //   Left atrial appendage tied off at the time of mitral valve surgery January, 2010 (maze pro  . Prosthetic valve dysfunction    Mild mitral stenosis  . Pulmonary hypertension (HCC)    Moderate  . Renal artery stenosis (HCC)    Mild by history  . 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 Spontaneous pneumothorax    right thoracotomy - distant past  . Status post minimally invasive mitral valve replacement with bioprosthetic valve    33 mm Medtronic Mosaic porcine bioprosthesis placed via right mini thoracotomy for bacterial endocarditis complicated by severe MR and CHF   . Thoracic aortic aneurysm ([place]) 08/11/2016   a - Chest CTA 1/18:  Aneurysmal dilatation of aortic root is noted at 5.1 cm.      Past Surgical History:  Procedure Laterality Date  . APPENDECTOMY    . ASD REPAIR, SECUNDUM  07/17/2008   pericardial patch closure of ASD  . AV NODE ABLATION  07/2008   for rapid atrial fib  . CARDIAC CATHETERIZATION    . CARDIAC DEFIBRILLATOR PLACEMENT  ~ [address] [place]  . CARDIAC VALVE REPLACEMENT    . CATARACT EXTRACTION W/ INTRAOCULAR LENS  IMPLANT, BILATERAL Bilateral   . ESOPHAGOGASTRODUODENOSCOPY (EGD) WITH PROPOFOL N/A 09/29/2016   Procedure: ESOPHAGOGASTRODUODENOSCOPY (EGD) WITH PROPOFOL;  Surgeon: [name], MD;  Location: [facility] ENDOSCOPY;  Service: Endoscopy;  Laterality: N/A;  . HERNIA REPAIR    . IMPLANTABLE CARDIOVERTER DEFIBRILLATOR (ICD) GENERATOR CHANGE N/A  02/06/2012   Procedure: ICD GENERATOR CHANGE;  Surgeon: [name], MD;  Location: [facility] CATH LAB;  Service: Cardiovascular;  Laterality: N/A;  . INSERT / REPLACE / REMOVE PACEMAKER    . MASS BIOPSY Left    neck mass  . MITRAL VALVE REPLACEMENT Right 07/17/2008   74mm Medtronic Mosaic porcine bioprosthesis  . PENILE PROSTHESIS IMPLANT    . RIGHT HEART CATHETERIZATION N/A 08/16/2013   Procedure: RIGHT HEART CATH;  Surgeon: Josue Hector, MD;  Location: [facility] CATH LAB;  Service: Cardiovascular;  Laterality: N/A;  . TEE WITHOUT CARDIOVERSION N/A 09/06/2016   Procedure: TRANSESOPHAGEAL ECHOCARDIOGRAM (TEE);  Surgeon: Dorothy Spark, MD;  Location: [facility] ENDOSCOPY;  Service: Cardiovascular;  Laterality: N/A;  . THORACOTOMY Right 1970's   spontaneous pneumothorax - while in the [place]  . TOE SURGERY     left foot hammer toe  . TONSILLECTOMY    . VIDEO ASSISTED THORACOSCOPY (VATS)/WEDGE RESECTION Left 11/29/2013   Procedure: Video assisted thoracoscopy for wedge resection; mini thoracotomy;  Surgeon: [name], MD;  Location: [facility] OR;  Service: Thoracic;  Laterality: Left;     Medications: Current Outpatient Prescriptions  Medication Sig Dispense Refill  . acetaminophen (TYLENOL) 500 MG tablet Take 500-1,000  mg by mouth every 6 (six) hours as needed for moderate pain or headache.    Marland Kitchen amoxicillin (AMOXIL) 500 MG capsule Take 2,000 mg by mouth as directed. 1 hour prior to dental procedures    . aspirin EC 81 MG tablet Take 1 tablet (81 mg total) by mouth daily. 90 tablet 3  . atorvastatin (LIPITOR) 10 MG tablet Take 10 mg by mouth at bedtime.    . budesonide-formoterol (SYMBICORT) 160-4.5 MCG/ACT inhaler INHALE 2 PUFFS INTO THE LUNGS TWICE A DAY 10.2 Inhaler 5  . carvedilol (COREG) 3.125 MG tablet TAKE 1 TABLET BY MOUTH TWICE A DAY WITH MEALS 180 tablet 1  . furosemide (LASIX) 40 MG tablet Take 40 mg by mouth daily. May take 1 extra daily as needed for edema    . irbesartan (AVAPRO) 75 MG tablet TAKE 1 TABLET BY MOUTH EVERY DAY 90 tablet 3  . metFORMIN (GLUCOPHAGE) 1000 MG tablet Take 1 tablet (1,000 mg total) by mouth 2 (two) times daily with a meal. 180 tablet 3  . nitroGLYCERIN (NITROSTAT) 0.4 MG SL tablet Place 0.4 mg under the tongue every 5 (five) minutes as needed for chest pain (x 3 doses). Reported on 12/29/2015    . OXYGEN Inhale 2 L/min into the lungs continuous.     No current facility-administered medications for this visit.     Allergies: Allergies  Allergen Reactions  . Anticoagulant Compound Other (See Comments)    Pt had intracranial bleed, therefore all anticoagulation is contra-indicated per Dr. Ron Parker.  . Warfarin Sodium Other (See Comments)    Pt had intracranial bleed, therefore all anticoagulation is contra-indicated per Dr. Ron Parker.     Social History: The patient  reports that he quit smoking about 3 years ago. His smoking use included Cigarettes. He has a 45.00 pack-year smoking history. He has never used smokeless tobacco. He reports that he does not drink alcohol or use drugs.   Family History: The patient's family history includes Stomach cancer in his father; Stroke in his mother.   Review of Systems: Please see the history of present illness.   Otherwise, the  review of systems is positive for none.   All other systems are reviewed and negative.   Physical Exam: VS:  BP (!) 104/50 (BP Location: Left Arm, Patient Position: Sitting, Cuff Size: Normal)   Pulse 74   Ht 6\' 1"  (1.854 m)   Wt 201 lb (91.2 kg)   BMI 26.52 kg/m  .  BMI Body mass index is 26.52 kg/m.  Wt Readings from Last 3 Encounters:  12/28/16 201 lb (91.2 kg)  12/27/16 199 lb 12.8 oz (90.6 kg)  12/16/16 201 lb 3.2 oz (91.3 kg)    General: Pleasant. He is alert and in no acute distress. He has oxygen in place.   HEENT: Normal.  Neck: Supple, no JVD, carotid bruits, or masses noted.  Cardiac: Regular rate and rhythm. No murmurs, rubs, or gallops. Valve sounds ok. No edema. Lots of varicosities noted. Respiratory:  Lungs are clear to auscultation bilaterally with normal work of breathing.  GI: Soft and nontender.  MS: No deformity or atrophy. Gait and ROM intact.  Skin: Warm and dry. Color is normal.  Neuro:  Strength and sensation are intact and no gross focal deficits noted.  Psych: Alert, appropriate and with normal affect.   LABORATORY DATA:  EKG:  EKG is not ordered today.  Lab Results  Component Value Date   WBC 7.9 12/20/2016   HGB 15.6 12/20/2016   HCT 49.3 12/20/2016   PLT 151 12/20/2016   GLUCOSE 166 (H) 12/20/2016   CHOL 107 10/19/2015   TRIG 164.0 (H) 10/19/2015   HDL 35.40 (L) 10/19/2015   LDLDIRECT 80.2 03/31/2011   LDLCALC 39 10/19/2015   ALT 8 12/20/2016   AST 11 12/20/2016   NA 140 12/20/2016   K 5.1 12/20/2016   CL 102 09/29/2016   CREATININE 1.2 12/20/2016   BUN 20.6 12/20/2016   CO2 29 12/20/2016   TSH 0.592 09/24/2015   PSA 0.94 07/01/2013   INR 1.1 (H) 09/20/2016   HGBA1C 7.7 10/25/2016   MICROALBUR 3.2 (H) 01/22/2015     BNP (last 3 results) No results for input(s): BNP in the last 8760 hours.  ProBNP (last 3 results) No results for input(s): PROBNP in the last 8760 hours.   Other Studies Reviewed Today:  Echo Study  Conclusions 07/2016  - Left ventricle: The cavity size was mildly dilated. Wall   thickness was normal. Systolic function was mildly to moderately   reduced. The estimated ejection fraction was in the range of 40%   to 45%. Diffuse hypokinesis. The study is not technically   sufficient to allow evaluation of LV diastolic function. - Aorta: Dilated aorta. Linear echogenic structure seen in the   ascending aorta - suspect reverberation artifact. Consider CT or   TEE to definitively r/o dissection flap. Aortic root dimension:   42 mm (ED, M-mode). Aortic root dimension: 47 mm (ED). - Mitral valve: Bioprosthetic MVR. No obstruction. Mean gradient   (D): 3 mm Hg. Valve area by pressure half-time: 1.67 cm^2. Valve   area by continuity equation (using LVOT flow): 1.9 cm^2. - Left atrium: Severely dilated. - Right ventricle: The cavity size was moderately dilated. Pacer   wire or catheter noted in right ventricle. Systolic function was   low normal. - Right atrium: Severely dilated. Pacer wire or catheter noted in   right atrium. - Tricuspid valve: There was moderate regurgitation. - Pulmonary arteries: PA peak pressure: 42 mm Hg (S). - Inferior vena cava: The vessel was normal in size. The   respirophasic diameter changes were in the normal range (= 50%),   consistent with normal central venous pressure.  Impressions:  -  Compared to a prior study in 2015, the LVEF has improved to   40-45%. the bioprosthetic mitral valve appears to be functioning   normally. There is a linear density in the ascending aorta -   which is mildly dilated, this is likely a reverberation artifact,   but consider TEE or CT to definitively r/o dissection if   clinically indicated.   CARDIAC CT IMPRESSION 08/2016: 1. Aortic root has a pseudoaneurysm of the right coronary cusp measuring 36 x 32 mm. As a result the right coronary cusp is severely dilated measuring 61 mm in diameter. There is a septum that is  partially dividing a right coronary cusp in the middle. This most probably represents a result of a prior infection. There is no communication (fistula) seen with the adjacent tissues.  2. Sinotubular junction, ascending aorta, aortic arch and descending thoracic aorta have normal size. There is no dissection.  3. Mitral valve prosthesis sits well in the mitral position. Left atrium is massively dilated.  4. Myocardium of the left ventricular apex is thinned and mildly aneurysmal with no evidence of a thrombus.  5. Heavy calcifications of all three coronary arteries with calcium score of 837 that represents 83 percentile for age/sex.  Ena Dawley  Electronically Signed   By: Ena Dawley   On: 08/30/2016 15:06   [place] 12/09 LAD distal 30 R Renal Art  30-40   Assessment/Plan: 1. Chronic systolic CHF - with NICM. He had no significant CAD at [facility] 2009 prior to his MVR.  Volume continues to appear to be stable.  Continue beta-blocker, angiotensin receptor blocker, Lasix.  Recent labs noted.   2. S/p MVR - Overall stable by exam.   Continues SBE prophylaxis.     3. Permanent AF - s/p AV node ablation and BiV-ICD.  Not a candidate for anticoagulation due to prior hx of intracranial hemorrhage on Coumadin.    4. S/p BiV-ICD - Continue FU with EP as planned. Sees Dr. Lovena Le in August.   5. HLD -  on statin.  6. COPD/pulmonary HTN - On continuous O2.  Continue FU with Pulmonology as planned.   7. 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 Roxy Manns is following. Plan for repeat scan in September.   8. Cirrhosis - followed by GI.   Current medicines are reviewed with the patient today.  The patient does not have concerns regarding medicines other than what has been noted above.  The following changes have been made:  See above.  Labs/ tests ordered today include:   No orders of the  defined types were placed in this encounter.    Disposition:   FU with me in October. He has significant medical issues but overall seems to be holding his own but overall prognosis tenuous at best.   Patient is agreeable to this plan and will call if any problems develop in the interim.   SignedTruitt Merle, NP  12/28/2016 11:03 AM  [facility] [address] [place], [place]  [address] Phone: [phone] Fax: [phone]

## 2016-12-28 NOTE — Patient Instructions (Addendum)
We will be checking the following labs today - NONE   Medication Instructions:    Continue with your current medicines.     Testing/Procedures To Be Arranged:  N/A  Follow-Up:   See me in October.     Other Special Instructions:   Keep up the walking!    If you need a refill on your cardiac medications before your next appointment, please call your pharmacy.   Call the [place] office at [phone] if you have any questions, problems or concerns.

## 2017-01-02 ENCOUNTER — Ambulatory Visit: Payer: Medicare Other | Admitting: Cardiology

## 2017-01-20 IMAGING — CT CT CHEST W/ CM
2 of 4 series · 15 of 36 positions shown, 18 images · IV contrast (OMNIPAQUE)
Comparison: 12/29/2014.

CLINICAL DATA: Subsequent encounter for stage I IA non-small-cell
lung cancer, well differentiated adenocarcinoma diagnosed in
Sunday August, 2013. Patient is status post wedge resection of the left
upper lobe lesion.

EXAM:
CT CHEST WITH CONTRAST
TECHNIQUE: Multidetector CT imaging of the chest was performed during
intravenous contrast administration.
CONTRAST:  75mL OMNIPAQUE IOHEXOL 300 MG/ML  SOLN

[Series 2: chest with st · axial · 0.90mm/px · z∈[-378,-58]mm · 12 of 76 slices shown, 15 images]
[im 6/76  mediastinal]
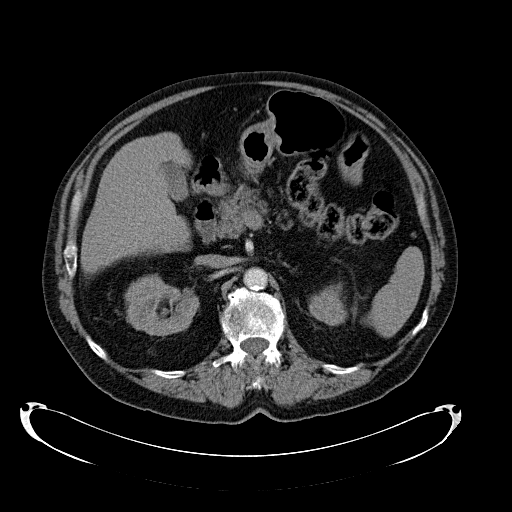
[im 6/76  lung]
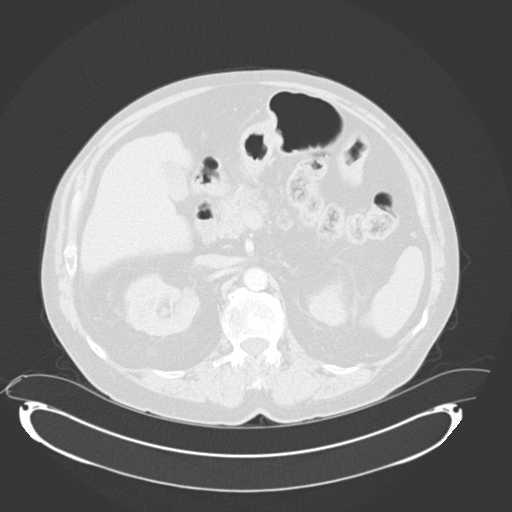
[im 11/76  lung]
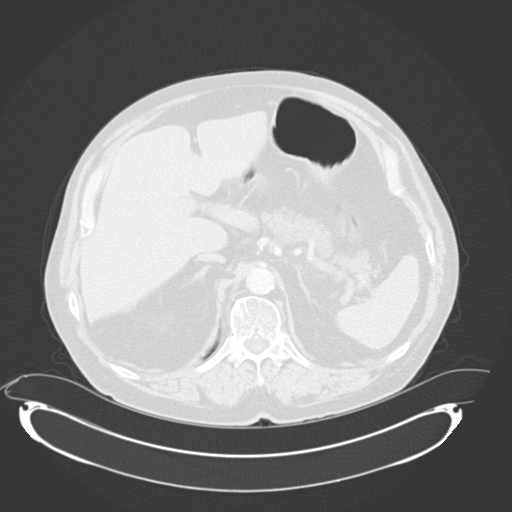
[im 17/76  lung]
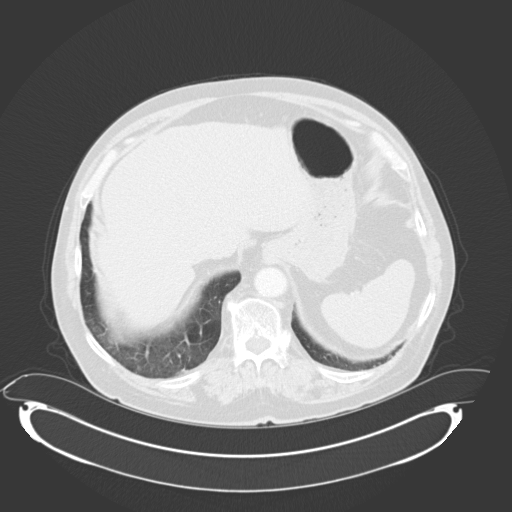
[im 22/76  lung]
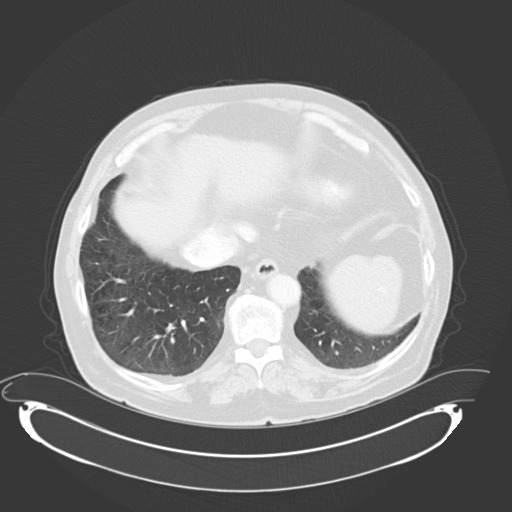
[im 27/76  mediastinal]
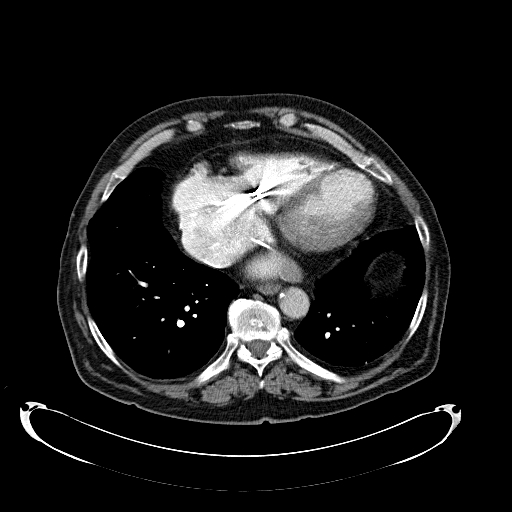
[im 27/76  lung]
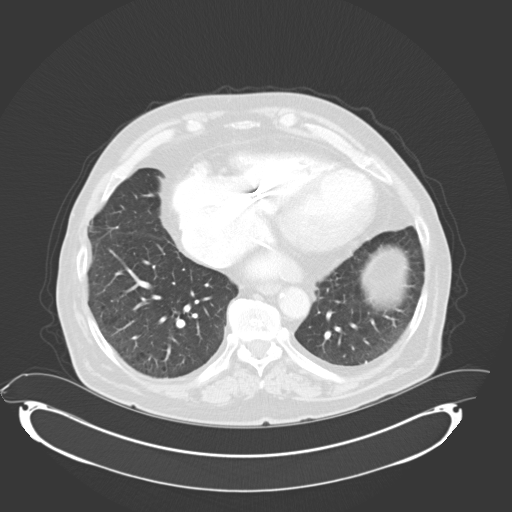
[im 33/76  lung]
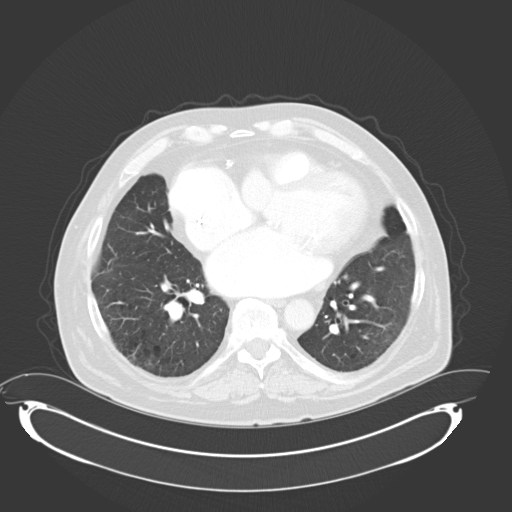
[im 43/76  lung]
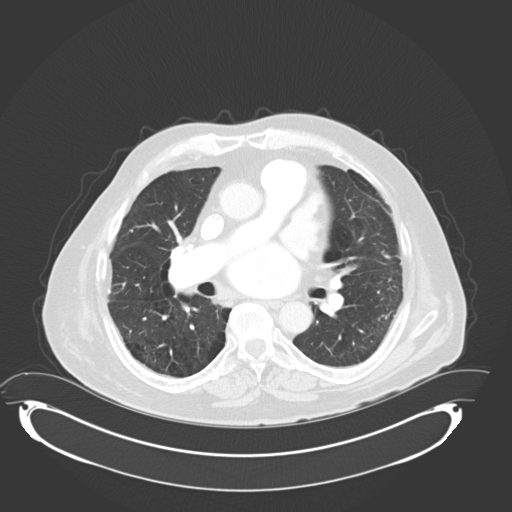
[im 49/76  lung]
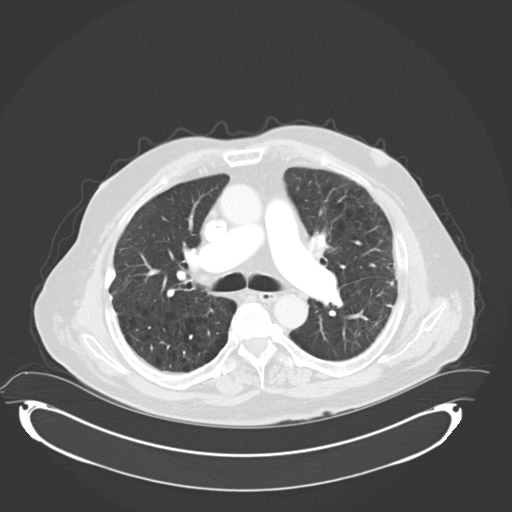
[im 54/76  mediastinal]
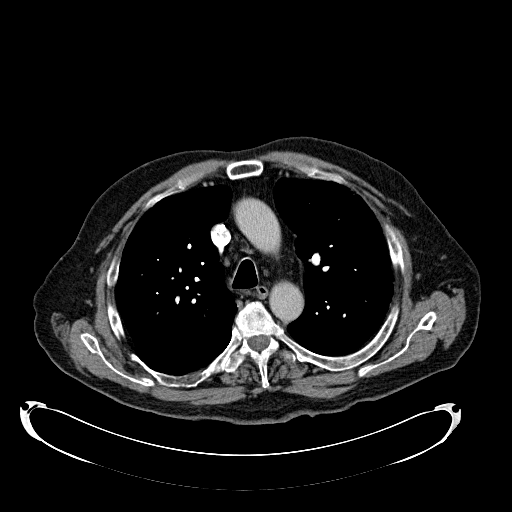
[im 54/76  lung]
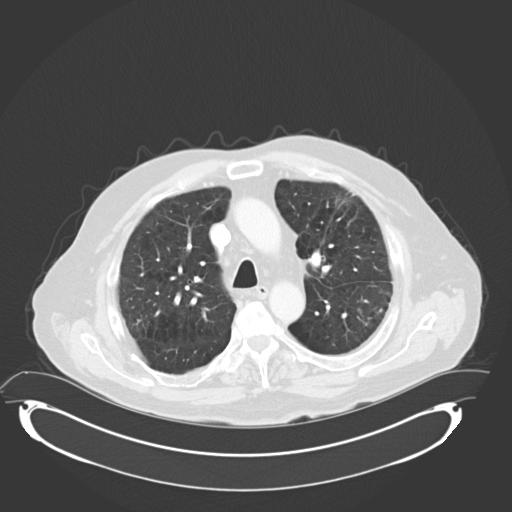
[im 59/76  lung]
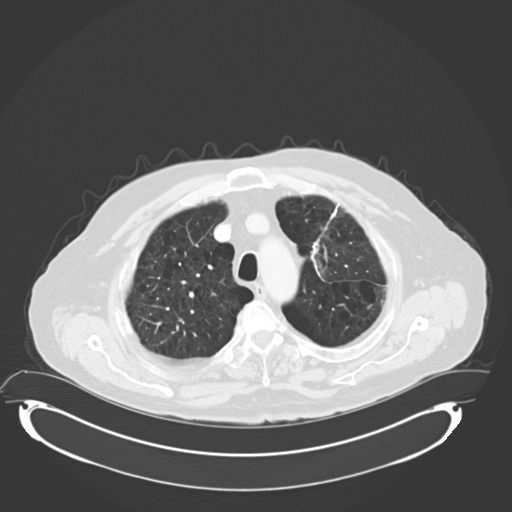
[im 65/76  lung]
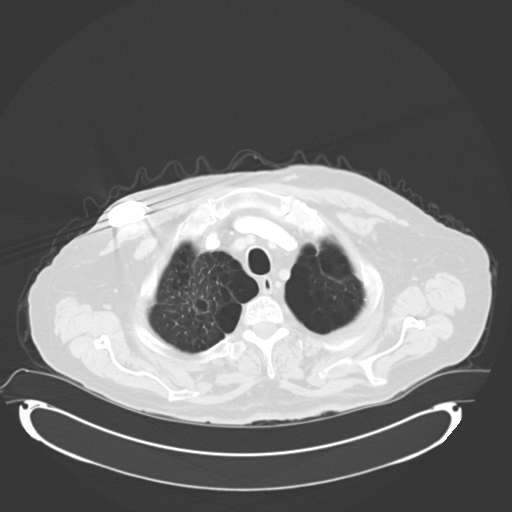
[im 70/76  lung]
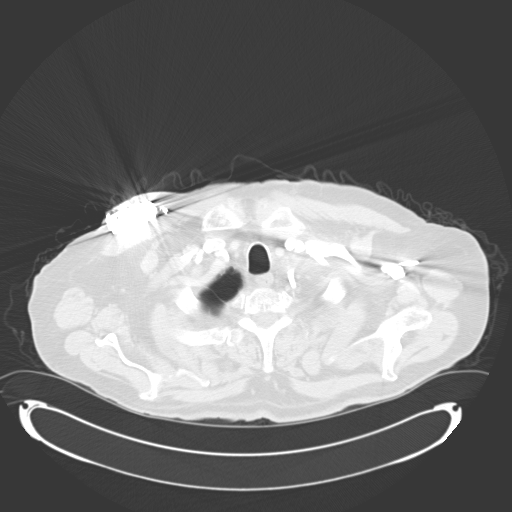

[Series 602: <mpr thick range> · coronal · 0.90mm/px · 3 of 103 slices shown]
[im 21/103  lung]
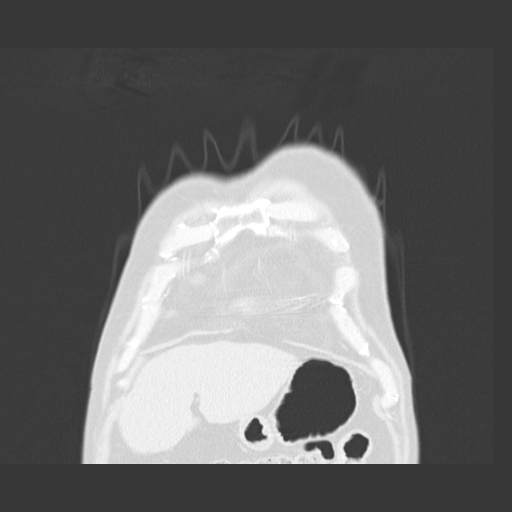
[im 41/103  lung]
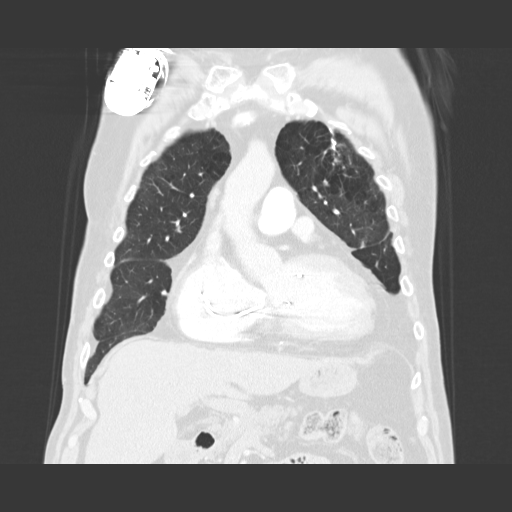
[im 62/103  lung]
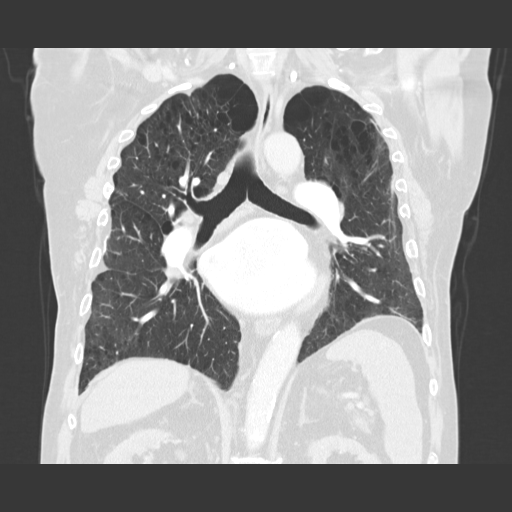

[15 of 36 positions shown; findings below may reference images not displayed]

FINDINGS: Mediastinum / Lymph Nodes: There is no axillary lymphadenopathy. No
mediastinal lymphadenopathy. Scattered small paratracheal and
subcarinal lymph nodes are stable. There is no axillary
lymphadenopathy. Heart is enlarged. Right-sided permanent pacer/
AICD noted in situ. No pericardial effusion. The esophagus has
normal imaging features.

Lungs / Pleura: Centrilobular and paraseptal emphysema noted
bilaterally. Suture line again noted in the left upper lobe, stable
in appearance. Tiny 2-3 mm nodule in the lung near the suture line
(image 20 series 5 today) is unchanged in the interval. Multiple
other scattered tiny bilateral pulmonary nodules are unchanged.
Suture line also noted laterally in the right middle lobe. No new or
progressive findings on today's exam.

Upper Abdomen:  Unremarkable.

[HOSPITAL] / Soft Tissues: Bone windows reveal no worrisome lytic or
sclerotic osseous lesions.
IMPRESSION: 1. Stable exam. No new or progressive findings to suggest recurrent
or metastatic disease in the chest.
2. Stable left upper lobe pulmonary nodule with other stable
scattered tiny bilateral parenchymal nodules.
3. Emphysema.

## 2017-01-31 ENCOUNTER — Encounter: Payer: Self-pay | Admitting: Gastroenterology

## 2017-02-20 ENCOUNTER — Encounter: Payer: Self-pay | Admitting: Internal Medicine

## 2017-02-21 ENCOUNTER — Other Ambulatory Visit: Payer: Self-pay | Admitting: *Deleted

## 2017-03-01 ENCOUNTER — Ambulatory Visit (INDEPENDENT_AMBULATORY_CARE_PROVIDER_SITE_OTHER): Payer: Medicare Other | Admitting: *Deleted

## 2017-03-01 DIAGNOSIS — I428 Other cardiomyopathies: Secondary | ICD-10-CM

## 2017-03-01 NOTE — Progress Notes (Signed)
Remote ICD transmission.   

## 2017-03-08 ENCOUNTER — Encounter: Payer: Self-pay | Admitting: Internal Medicine

## 2017-03-08 ENCOUNTER — Ambulatory Visit (INDEPENDENT_AMBULATORY_CARE_PROVIDER_SITE_OTHER): Payer: Medicare Other | Admitting: Internal Medicine

## 2017-03-08 ENCOUNTER — Other Ambulatory Visit: Payer: Self-pay | Admitting: *Deleted

## 2017-03-08 VITALS — BP 120/70 | HR 70 | Ht 73.0 in | Wt 200.4 lb

## 2017-03-08 DIAGNOSIS — I5042 Chronic combined systolic (congestive) and diastolic (congestive) heart failure: Secondary | ICD-10-CM

## 2017-03-08 DIAGNOSIS — I5022 Chronic systolic (congestive) heart failure: Secondary | ICD-10-CM

## 2017-03-08 DIAGNOSIS — I482 Chronic atrial fibrillation: Secondary | ICD-10-CM

## 2017-03-08 DIAGNOSIS — I428 Other cardiomyopathies: Secondary | ICD-10-CM | POA: Diagnosis not present

## 2017-03-08 DIAGNOSIS — I4821 Permanent atrial fibrillation: Secondary | ICD-10-CM

## 2017-03-08 LAB — CUP PACEART REMOTE DEVICE CHECK
Date Time Interrogation Session: 20180822080018
HIGH POWER IMPEDANCE MEASURED VALUE: 69 Ohm
HighPow Impedance: 69 Ohm
Implantable Lead Implant Date: 20100113
Implantable Lead Location: 753860
Implantable Lead Model: 7122
Lead Channel Impedance Value: 1025 Ohm
Lead Channel Impedance Value: 410 Ohm
Lead Channel Pacing Threshold Amplitude: 1.125 V
Lead Channel Pacing Threshold Pulse Width: 0.5 ms
Lead Channel Setting Pacing Amplitude: 2.125
Lead Channel Setting Pacing Pulse Width: 0.5 ms
MDC IDC LEAD IMPLANT DT: 20100113
MDC IDC LEAD LOCATION: 753858
MDC IDC MSMT BATTERY REMAINING LONGEVITY: 41 mo
MDC IDC MSMT BATTERY REMAINING PERCENTAGE: 44 %
MDC IDC MSMT BATTERY VOLTAGE: 2.92 V
MDC IDC MSMT LEADCHNL LV PACING THRESHOLD PULSEWIDTH: 0.5 ms
MDC IDC MSMT LEADCHNL RV PACING THRESHOLD AMPLITUDE: 0.625 V
MDC IDC MSMT LEADCHNL RV SENSING INTR AMPL: 12 mV
MDC IDC PG IMPLANT DT: 20130729
MDC IDC SET LEADCHNL LV PACING PULSEWIDTH: 0.5 ms
MDC IDC SET LEADCHNL RV PACING AMPLITUDE: 2 V
MDC IDC SET LEADCHNL RV SENSING SENSITIVITY: 0.5 mV
Pulse Gen Serial Number: 7053988

## 2017-03-08 LAB — CUP PACEART INCLINIC DEVICE CHECK
Battery Remaining Longevity: 40 mo
Brady Statistic RA Percent Paced: 0 %
HIGH POWER IMPEDANCE MEASURED VALUE: 77.625
Implantable Lead Implant Date: 20100113
Implantable Lead Implant Date: 20100113
Implantable Lead Location: 753860
Implantable Pulse Generator Implant Date: 20130729
Lead Channel Impedance Value: 387.5 Ohm
Lead Channel Pacing Threshold Amplitude: 0.75 V
Lead Channel Pacing Threshold Amplitude: 1 V
Lead Channel Pacing Threshold Pulse Width: 0.5 ms
Lead Channel Pacing Threshold Pulse Width: 0.5 ms
Lead Channel Pacing Threshold Pulse Width: 0.5 ms
Lead Channel Pacing Threshold Pulse Width: 0.5 ms
Lead Channel Sensing Intrinsic Amplitude: 12 mV
Lead Channel Setting Pacing Pulse Width: 0.5 ms
Lead Channel Setting Sensing Sensitivity: 0.5 mV
MDC IDC LEAD LOCATION: 753858
MDC IDC MSMT LEADCHNL LV IMPEDANCE VALUE: 962.5 Ohm
MDC IDC MSMT LEADCHNL LV PACING THRESHOLD AMPLITUDE: 1 V
MDC IDC MSMT LEADCHNL RV PACING THRESHOLD AMPLITUDE: 0.75 V
MDC IDC PG SERIAL: 7053988
MDC IDC SESS DTM: 20180829152359
MDC IDC SET LEADCHNL LV PACING AMPLITUDE: 2 V
MDC IDC SET LEADCHNL LV PACING PULSEWIDTH: 0.5 ms
MDC IDC SET LEADCHNL RV PACING AMPLITUDE: 2 V
MDC IDC STAT BRADY RV PERCENT PACED: 99.62 %

## 2017-03-08 NOTE — Patient Instructions (Signed)
Medication Instructions:  Your physician recommends that you continue on your current medications as directed. Please refer to the Current Medication list given to you today.  Labwork: None ordered.  Testing/Procedures: None ordered.  Follow-Up: Your physician wants you to follow-up in: one year with Dr. Lovena Le.   You will receive a reminder letter in the mail two months in advance. If you don't receive a letter, please call our office to schedule the follow-up appointment.  Remote monitoring is used to monitor your ICD from home. This monitoring reduces the number of office visits required to check your device to one time per year. It allows [place] to keep an eye on the functioning of your device to ensure it is working properly. You are scheduled for a device check from home on 05/31/2017. You may send your transmission at any time that day. If you have a wireless device, the transmission will be sent automatically. After your physician reviews your transmission, you will receive a postcard with your next transmission date.    Any Other Special Instructions Will Be Listed Below (If Applicable).     If you need a refill on your cardiac medications before your next appointment, please call your pharmacy.

## 2017-03-08 NOTE — Progress Notes (Signed)
HPI [name] returns today for followup of his ICD. He is a very pleasant 71 year old man with multiple medical problems including chronic systolic heart failure, status post biventricular ICD insertion, chronic atrial fibrillation with a rapid ventricular response status post AV node ablation, mitral valve replacement, COPD on home oxygen, and cirrhosis. In the interim, he has been using home oxygen therapy. He has had no ICD shocks. He denies peripheral edema. No anginal symptoms. Allergies  Allergen Reactions  . Anticoagulant Compound Other (See Comments)    Pt had intracranial bleed, therefore all anticoagulation is contra-indicated per Dr. Ron Parker.  . Warfarin Sodium Other (See Comments)    Pt had intracranial bleed, therefore all anticoagulation is contra-indicated per Dr. Ron Parker.      Current Outpatient Prescriptions  Medication Sig Dispense Refill  . acetaminophen (TYLENOL) 500 MG tablet Take 500-1,000 mg by mouth every 6 (six) hours as needed for moderate pain or headache.    Marland Kitchen amoxicillin (AMOXIL) 500 MG capsule Take 2,000 mg by mouth as directed. 1 hour prior to dental procedures    . aspirin EC 81 MG tablet Take 1 tablet (81 mg total) by mouth daily. 90 tablet 3  . atorvastatin (LIPITOR) 10 MG tablet Take 10 mg by mouth at bedtime.    . budesonide-formoterol (SYMBICORT) 160-4.5 MCG/ACT inhaler INHALE 2 PUFFS INTO THE LUNGS TWICE A DAY 10.2 Inhaler 5  . carvedilol (COREG) 3.125 MG tablet TAKE 1 TABLET BY MOUTH TWICE A DAY WITH MEALS 180 tablet 1  . furosemide (LASIX) 40 MG tablet Take 40 mg by mouth daily. May take 1 extra daily as needed for edema    . irbesartan (AVAPRO) 75 MG tablet TAKE 1 TABLET BY MOUTH EVERY DAY 90 tablet 3  . nitroGLYCERIN (NITROSTAT) 0.4 MG SL tablet Place 0.4 mg under the tongue every 5 (five) minutes as needed for chest pain (x 3 doses). Reported on 12/29/2015    . OXYGEN Inhale 2 L/min into the lungs continuous.    . metFORMIN (GLUCOPHAGE) 1000 MG  tablet Take 1 tablet (1,000 mg total) by mouth 2 (two) times daily with a meal. 180 tablet 3   No current facility-administered medications for this visit.      Past Medical History:  Diagnosis Date  . Atrial fibrillation ([place])    AV Node ablation January, 2010, for rapid atrial fib  . Atrial septal defect    Closed with surgery January, 2010  . Automatic implantable cardioverter-defibrillator in situ    LV dysfunction and pacer needed for AV node lesion  . Cardiomyopathy    non-ischemic  . CHF (congestive heart failure) ([place])   . Chronic combined systolic and diastolic CHF (congestive heart failure) ([place])   . Colon polyps   . COPD (chronic obstructive pulmonary disease) (HCC)    O2- 2 liters, nasal cannula, q night   . COPD GOLD II 01/11/2007   PFT's 09/25/13  FEV1  2.18 (61%) ratio 64 no change p B2 and DLC0  32% corrects to 68% - trial off advair and acei rec starting  08/23/2013     . CVA (cerebral vascular accident) ([place]) 2009   denies residual on 08/14/2013  . Diabetes mellitus without complication ([place])    type 2  . Dyslipidemia   . Dysrhythmia   . Ejection fraction < 50%   . Endocarditis    Bacterial, 2009  . Headache(784.0)    related to stroke only  . HLD (hyperlipidemia)   .  Hypertension   . Intracranial hemorrhage (HCC)    Coumadin cannot be used because of the history of his bleed  . Lung cancer ([place]) 11/29/2013   T1N0 Stage Ia non-small cell carcinoma left lung treated with wedge resection  . Mitral regurgitation    Severe symptomatic primary MR due to bacterial endocarditis, treated w/ MVR // Echo 1/18 EF 40-45, diffuse HK, dilated aorta at 42 mm/aortic root 47 mm; linear echogenic structure in ascending aorta - suspect reverberation artifact / consider CT or TEE to rule out dissection flap, bioprosthetic MVR with mean gradient 3, severe LAE, low normal RVSF, severe RAE, moderate TR, PASP 42  . Myocardial infarction ([place]) 2010  . Pacemaker    combo pacer and icd    . Permanent atrial fibrillation    Originally Coumadin use for atrial fibrillation  //   he had intracerebral hemorrhage with an INR of 2.3 June, 2009. Anticoagulation could no longer be used.  //  Rapid atrial fibrillation after inferior MI October, 2010..........Marland Kitchen AV node ablation done at that time with ICD pacemaker placed (EF 35%).   //   Left atrial appendage tied off at the time of mitral valve surgery January, 2010 (maze pro  . Prosthetic valve dysfunction    Mild mitral stenosis  . Pulmonary hypertension (HCC)    Moderate  . Renal artery stenosis (HCC)    Mild by history  . 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 Spontaneous pneumothorax    right thoracotomy - distant past  . Status post minimally invasive mitral valve replacement with bioprosthetic valve    33 mm Medtronic Mosaic porcine bioprosthesis placed via right mini thoracotomy for bacterial endocarditis complicated by severe MR and CHF   . Thoracic aortic aneurysm ([place]) 08/11/2016   a - Chest CTA 1/18:  Aneurysmal dilatation of aortic root is noted at 5.1 cm.     ROS:   All systems reviewed and negative except as noted in the HPI.   Past Surgical History:  Procedure Laterality Date  . APPENDECTOMY    . ASD REPAIR, SECUNDUM  07/17/2008   pericardial patch closure of ASD  . AV NODE ABLATION  07/2008   for rapid atrial fib  . CARDIAC CATHETERIZATION    . CARDIAC DEFIBRILLATOR PLACEMENT  ~ [address] Jude  . CARDIAC VALVE REPLACEMENT    . CATARACT EXTRACTION W/ INTRAOCULAR LENS  IMPLANT, BILATERAL Bilateral   . ESOPHAGOGASTRODUODENOSCOPY (EGD) WITH PROPOFOL N/A 09/29/2016   Procedure: ESOPHAGOGASTRODUODENOSCOPY (EGD) WITH PROPOFOL;  Surgeon: [name], MD;  Location: [facility] ENDOSCOPY;  Service: Endoscopy;  Laterality: N/A;  . HERNIA REPAIR    . IMPLANTABLE CARDIOVERTER DEFIBRILLATOR (ICD) GENERATOR CHANGE N/A 02/06/2012   Procedure: ICD GENERATOR CHANGE;  Surgeon: [name], MD;  Location: [facility] CATH LAB;  Service:  Cardiovascular;  Laterality: N/A;  . INSERT / REPLACE / REMOVE PACEMAKER    . MASS BIOPSY Left    neck mass  . MITRAL VALVE REPLACEMENT Right 07/17/2008   75mm Medtronic Mosaic porcine bioprosthesis  . PENILE PROSTHESIS IMPLANT    . RIGHT HEART CATHETERIZATION N/A 08/16/2013   Procedure: RIGHT HEART CATH;  Surgeon: Josue Hector, MD;  Location: [facility] CATH LAB;  Service: Cardiovascular;  Laterality: N/A;  . TEE WITHOUT CARDIOVERSION N/A 09/06/2016   Procedure: TRANSESOPHAGEAL ECHOCARDIOGRAM (TEE);  Surgeon: Dorothy Spark, MD;  Location: [facility] ENDOSCOPY;  Service: Cardiovascular;  Laterality: N/A;  . THORACOTOMY Right 1970's   spontaneous pneumothorax - while in  the [employer]  . TOE SURGERY     left foot hammer toe  . TONSILLECTOMY    . VIDEO ASSISTED THORACOSCOPY (VATS)/WEDGE RESECTION Left 11/29/2013   Procedure: Video assisted thoracoscopy for wedge resection; mini thoracotomy;  Surgeon: [name], MD;  Location: [facility] OR;  Service: Thoracic;  Laterality: Left;     Family History  Problem Relation Age of Onset  . Stomach cancer Father   . Stroke Mother      Social History   Social History  . Marital status: Married    Spouse name: [name]  . Number of children: 0  . Years of education: College   Occupational History  . Retired Retired    Tour manager   Social History Main Topics  . Smoking status: Former Smoker    Packs/day: 1.00    Years: 45.00    Types: Cigarettes    Quit date: 08/11/2013  . Smokeless tobacco: Never Used     Comment: 08/14/2013 "quit smoking in 2009"  . Alcohol use No     Comment: 08/14/2013 "used to drink beer; quit:in 1982"  . Drug use: No  . Sexual activity: Yes   Other Topics Concern  . Not on file   Social History Narrative   Patient lives at home with his spouse.   Caffeine Use: none   Worked for the [employer]   Has two boys and a girl. All live local.      BP 120/70   Pulse 70   Ht 6\' 1"  (1.854 m)   Wt 200 lb 6.4 oz (90.9 kg)   SpO2  93%   BMI 26.44 kg/m   Physical Exam:  Well appearing 71 year old man, NAD HEENT: Unremarkable, except for nasal cannula being warm. Neck:  6 cm JVD, no thyromegally Lymphatics:  No adenopathy Back:  No CVA tenderness Lungs:  Clear, with no wheezes, rales, or rhonchi breath sounds are reduced. HEART:  Regular rate rhythm, no murmurs, no rubs, no clicks Abd:  soft, positive bowel sounds, no organomegally, no rebound, no guarding Ext:  2 plus pulses, no edema, no cyanosis, no clubbing Skin:  No rashes no nodules Neuro:  CN II through XII intact, motor grossly intact  EKG - atrial fibrillation with biventricular pacing  DEVICE  Normal device function.  See PaceArt for details.   Assess/Plan: 1. Chronic atrial fibrillation - his ventricular rate is well controlled. No change in medical therapy. 2. Chronic systolic heart failure - his symptoms remain class II. He will continue his current medical therapy. 3. ICD - his [employer] Jude biventricular ICD is working normally. He has over 3 years of battery longevity. We discussed the possibility of a downgraded to a biventricular pacemaker. We'll discuss this further in the following visits.  [name], M.D.

## 2017-03-10 ENCOUNTER — Encounter: Payer: Self-pay | Admitting: Cardiology

## 2017-03-16 ENCOUNTER — Other Ambulatory Visit: Payer: Self-pay | Admitting: *Deleted

## 2017-03-16 DIAGNOSIS — I5042 Chronic combined systolic (congestive) and diastolic (congestive) heart failure: Secondary | ICD-10-CM

## 2017-03-22 ENCOUNTER — Other Ambulatory Visit: Payer: Self-pay | Admitting: *Deleted

## 2017-03-22 DIAGNOSIS — J449 Chronic obstructive pulmonary disease, unspecified: Secondary | ICD-10-CM

## 2017-03-23 ENCOUNTER — Ambulatory Visit (HOSPITAL_COMMUNITY): Payer: Medicare Other

## 2017-03-25 ENCOUNTER — Other Ambulatory Visit: Payer: Self-pay | Admitting: Pulmonary Disease

## 2017-03-29 ENCOUNTER — Ambulatory Visit (HOSPITAL_COMMUNITY): Payer: Medicare Other

## 2017-03-30 ENCOUNTER — Other Ambulatory Visit: Payer: Self-pay | Admitting: Pulmonary Disease

## 2017-03-31 ENCOUNTER — Ambulatory Visit (HOSPITAL_COMMUNITY): Payer: Medicare Other

## 2017-04-02 IMAGING — CR DG HIP (WITH OR WITHOUT PELVIS) 2-3V*R*
3 series · 3 of 3 positions shown · non-contrast
Comparison: None.

CLINICAL DATA: 69-year-old male with acute onset lumbar back pain
radiating to the posterior right hip. Initial encounter.

EXAM:
DG HIP (WITH OR WITHOUT PELVIS) 2-3V RIGHT

[t pelvis ap]
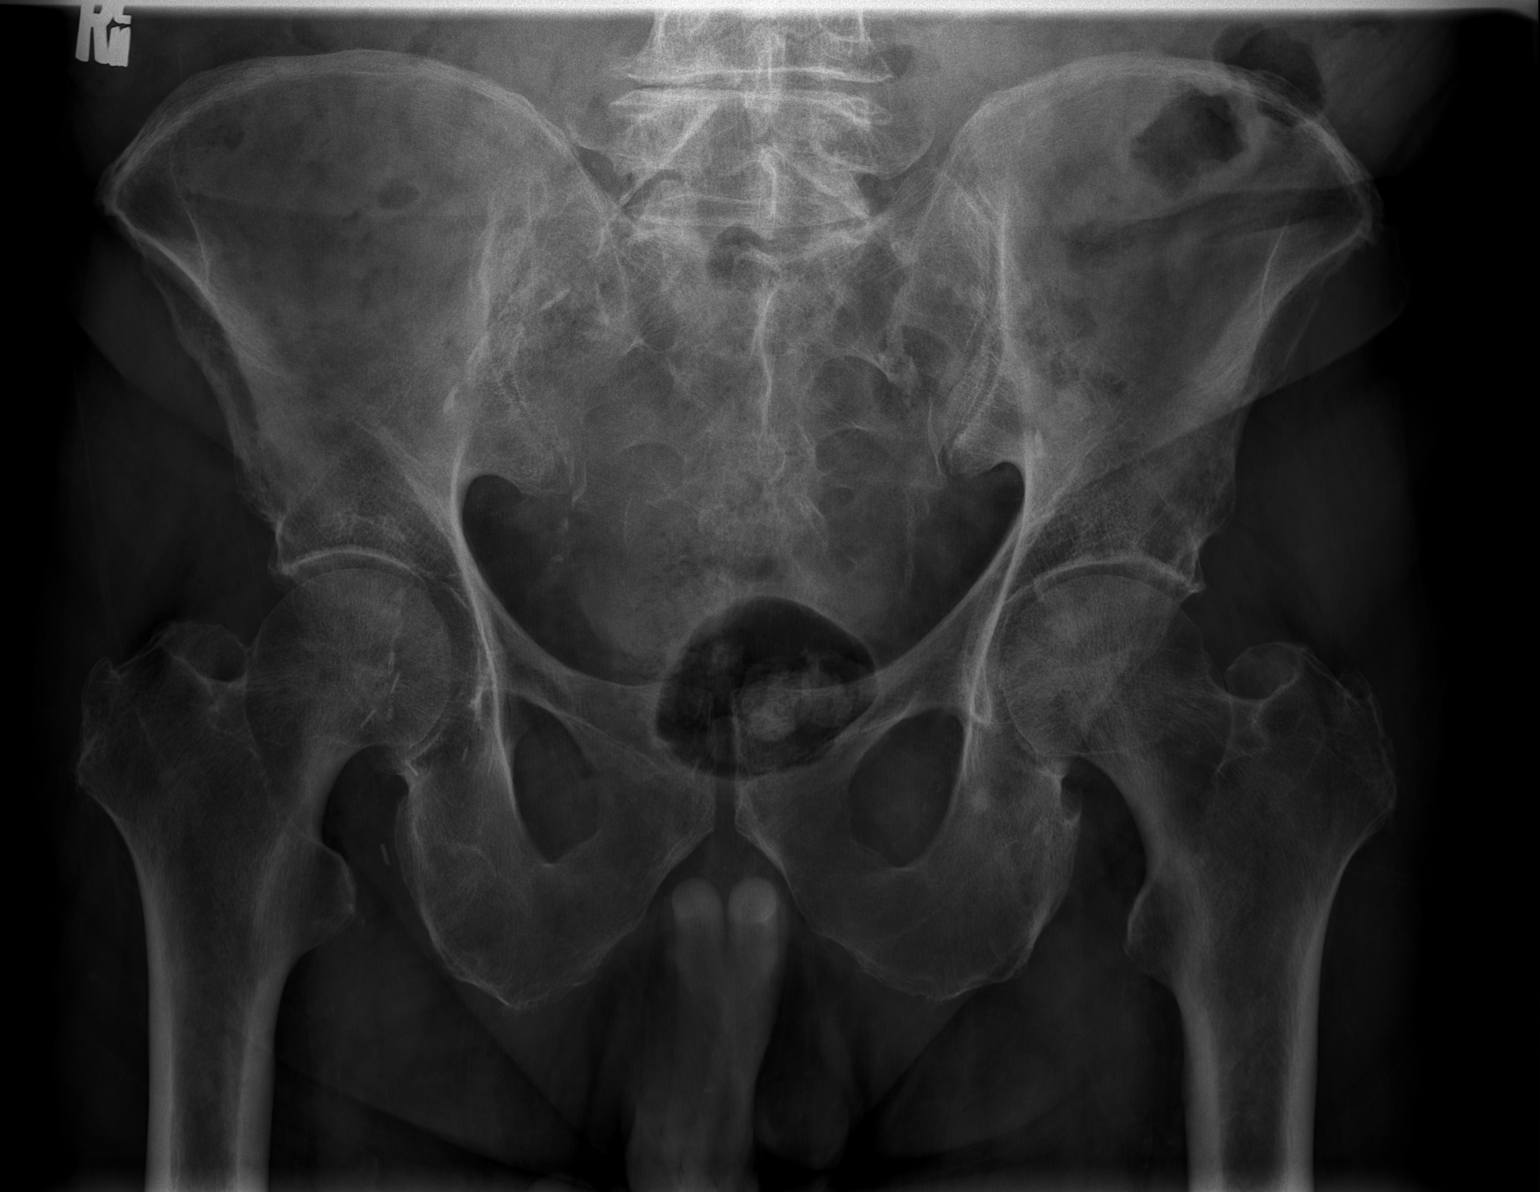

[t hip ap right]
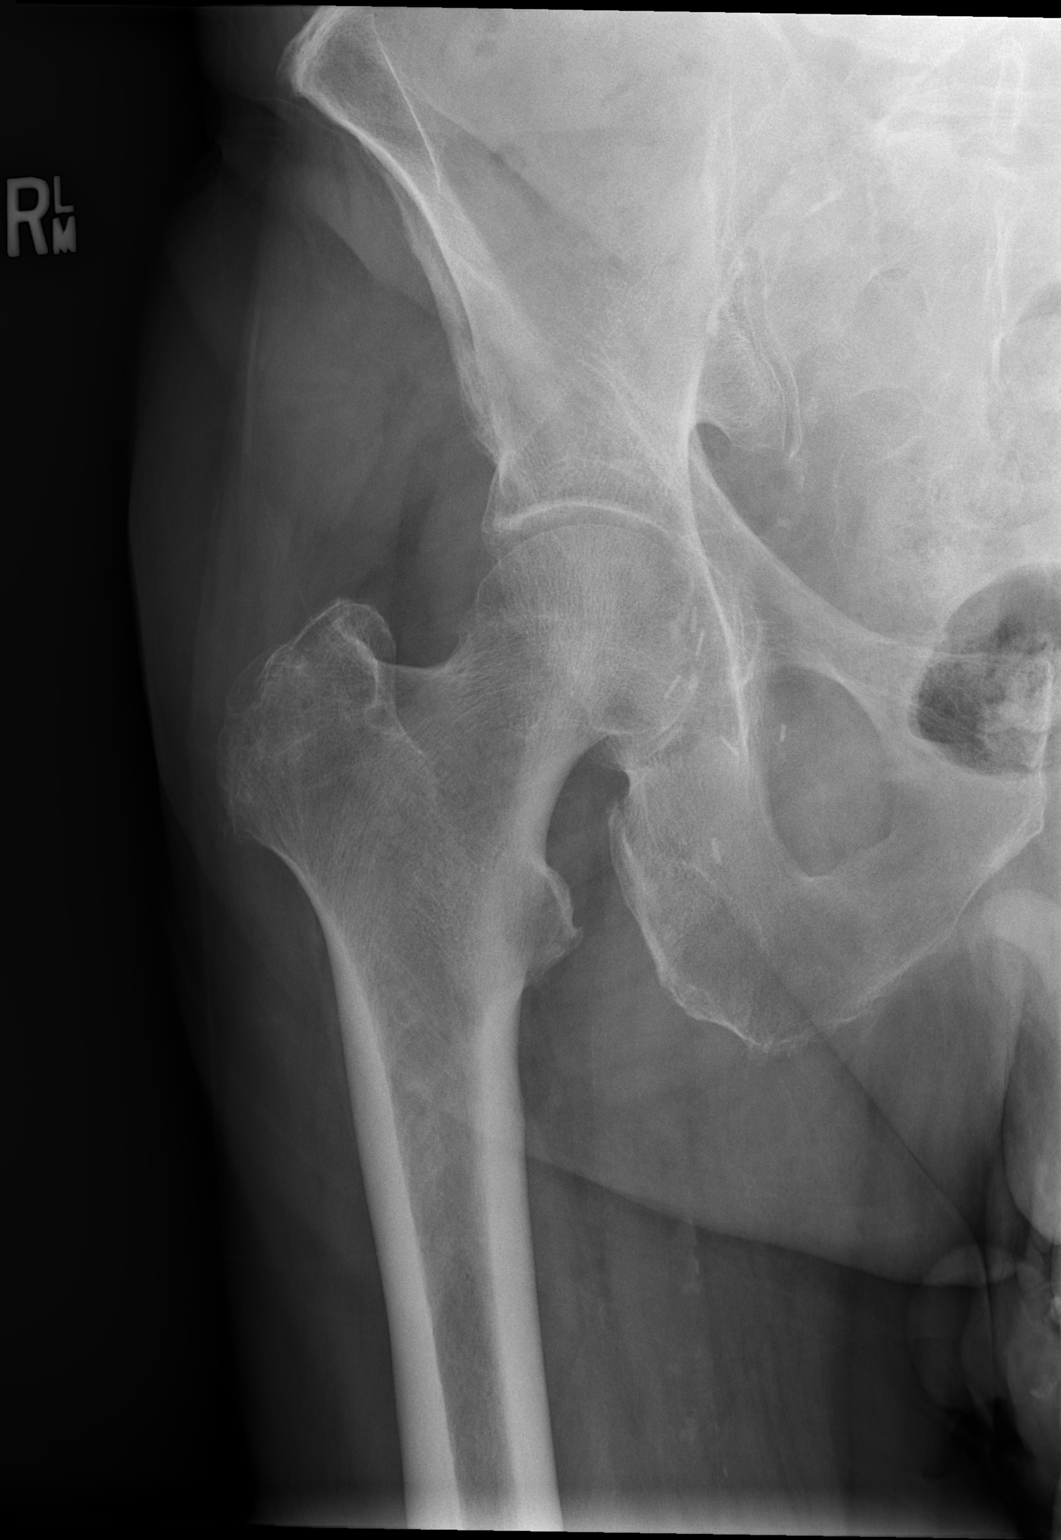

[t hip frog leg right]
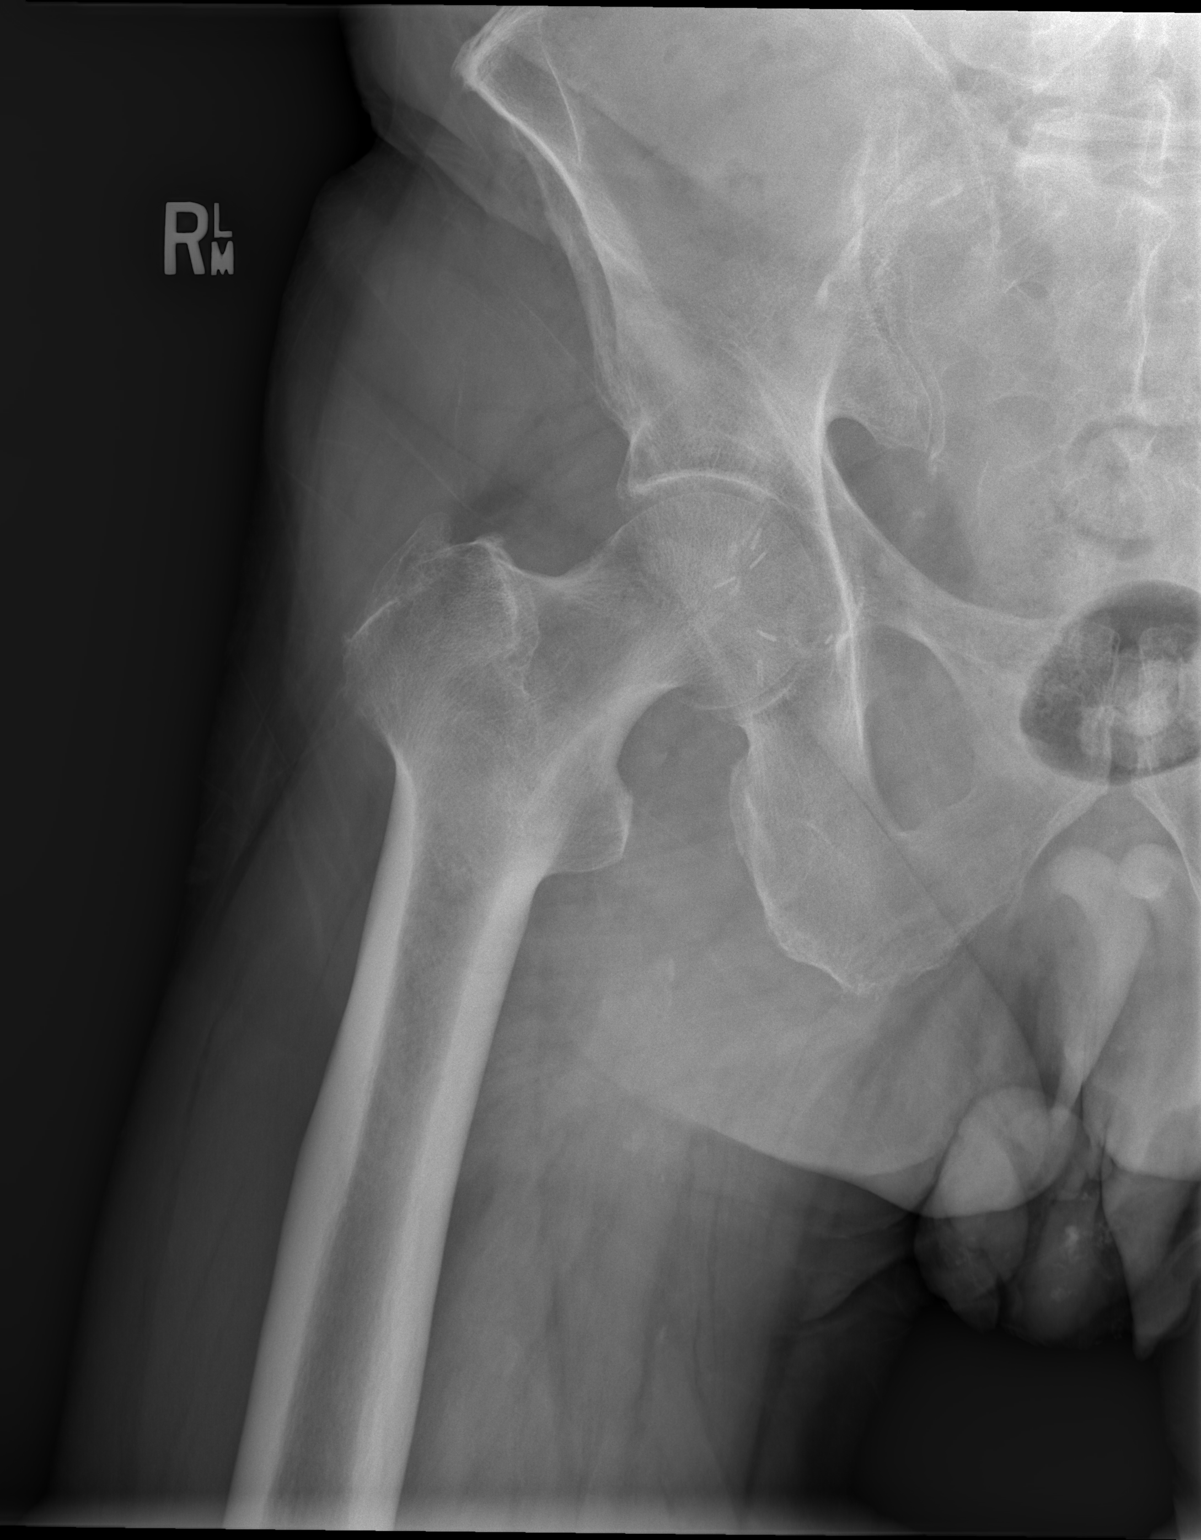

[3 of 3 positions shown; findings below may reference images not displayed]

FINDINGS: Femoral heads are normally located. Hip joint spaces are relatively
preserved and appear symmetric. Right inguinal surgical clips.
Pelvis intact. Sacral ala appear grossly intact. SI joints within
normal limits. Grossly intact proximal left femur. Proximal right
femur appears intact. Aortoiliac calcified atherosclerosis noted.
Right femoral artery calcified atherosclerosis.
IMPRESSION: 1. No acute osseous abnormality identified about the right hip or
femur.
2. Calcified aortic atherosclerosis.

## 2017-04-02 IMAGING — CR DG LUMBAR SPINE COMPLETE 4+V
5 series · 5 of 5 positions shown · non-contrast
Comparison: CT abdomen and pelvis 06/07/2008.

CLINICAL DATA: The patient bent over and felt a pop in his low back
3 days ago with onset of pain. Initial encounter.

EXAM:
LUMBAR SPINE - COMPLETE 4+ VIEW

[t lumbar spine ap]
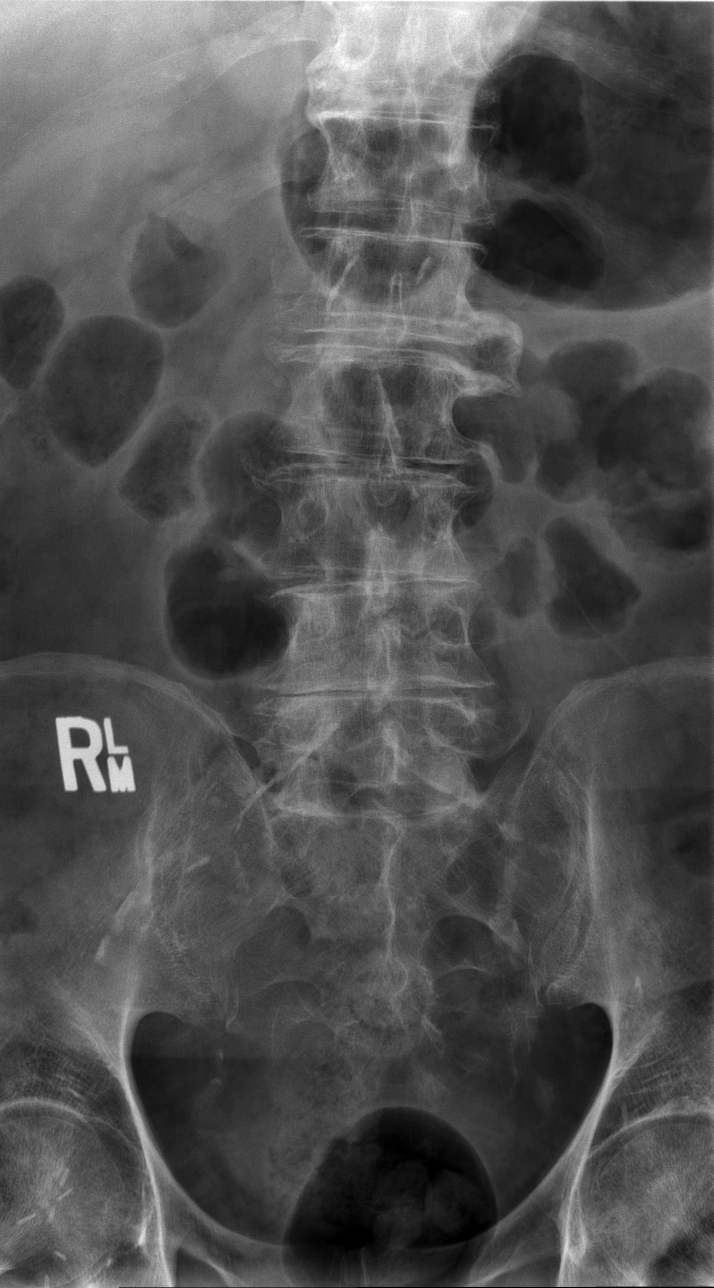

[t lumbar spine obl (1 of 2)]
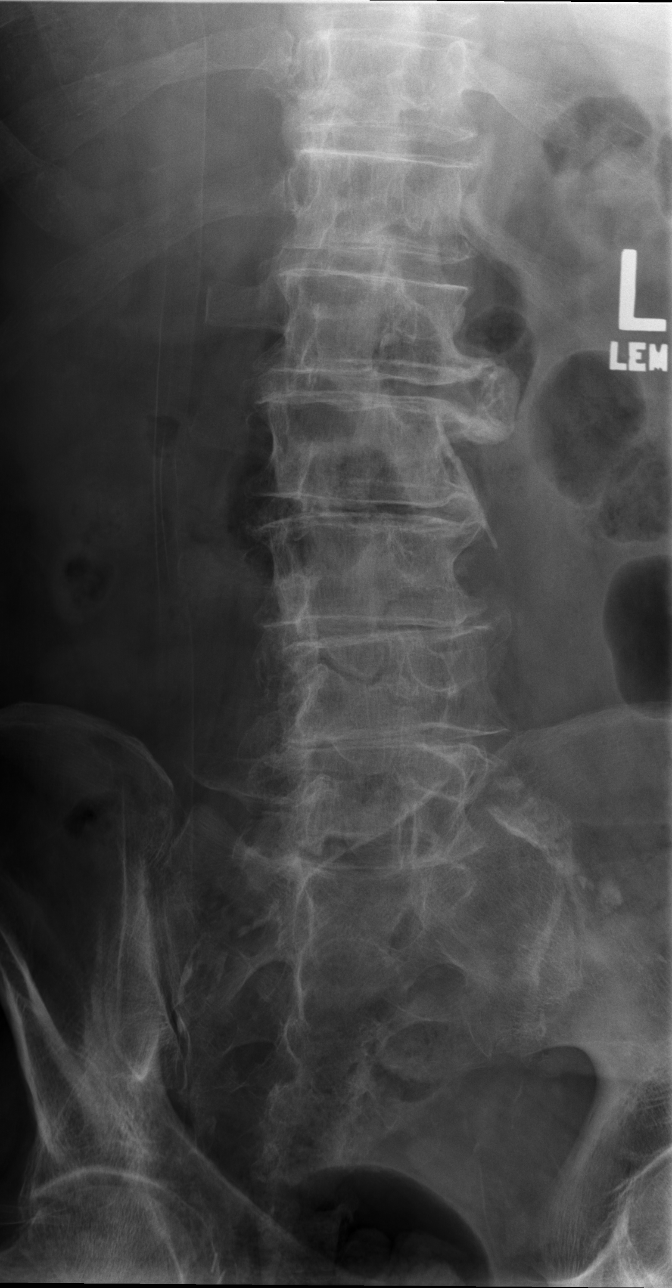

[t lumbar spine obl (2 of 2)]
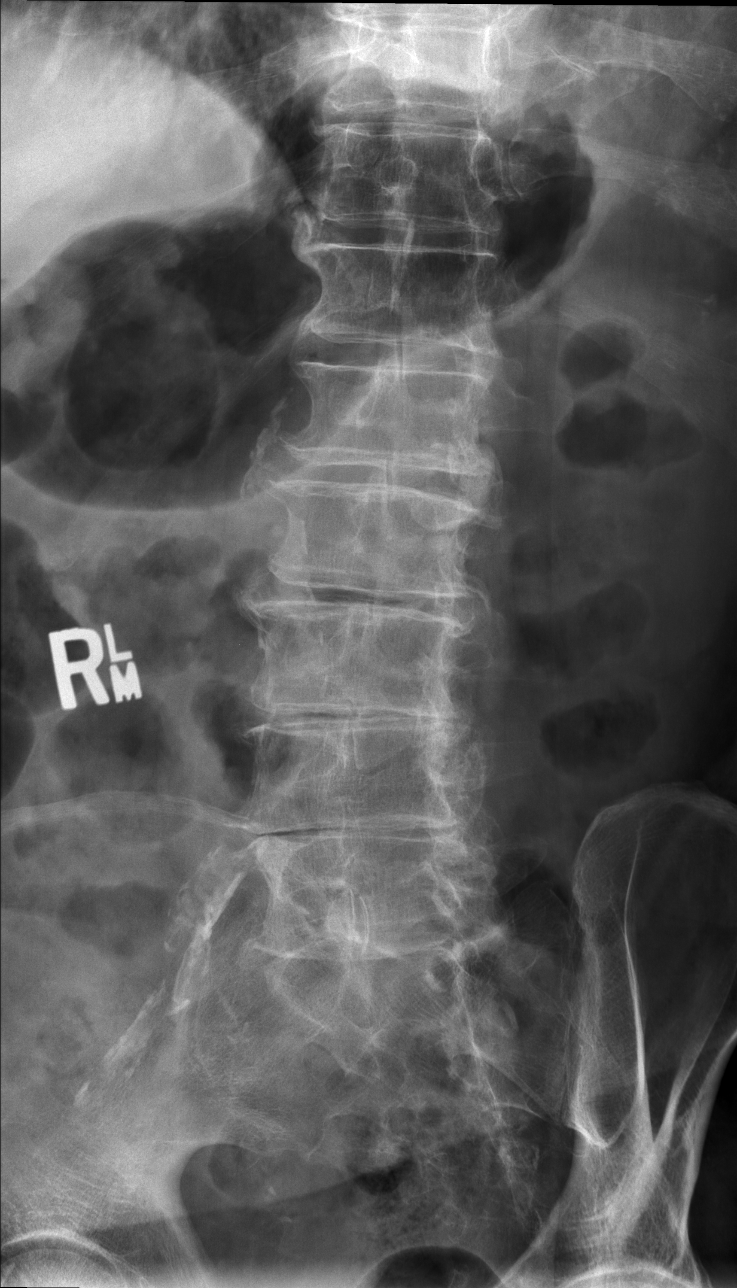

[t lumbar spine lat]
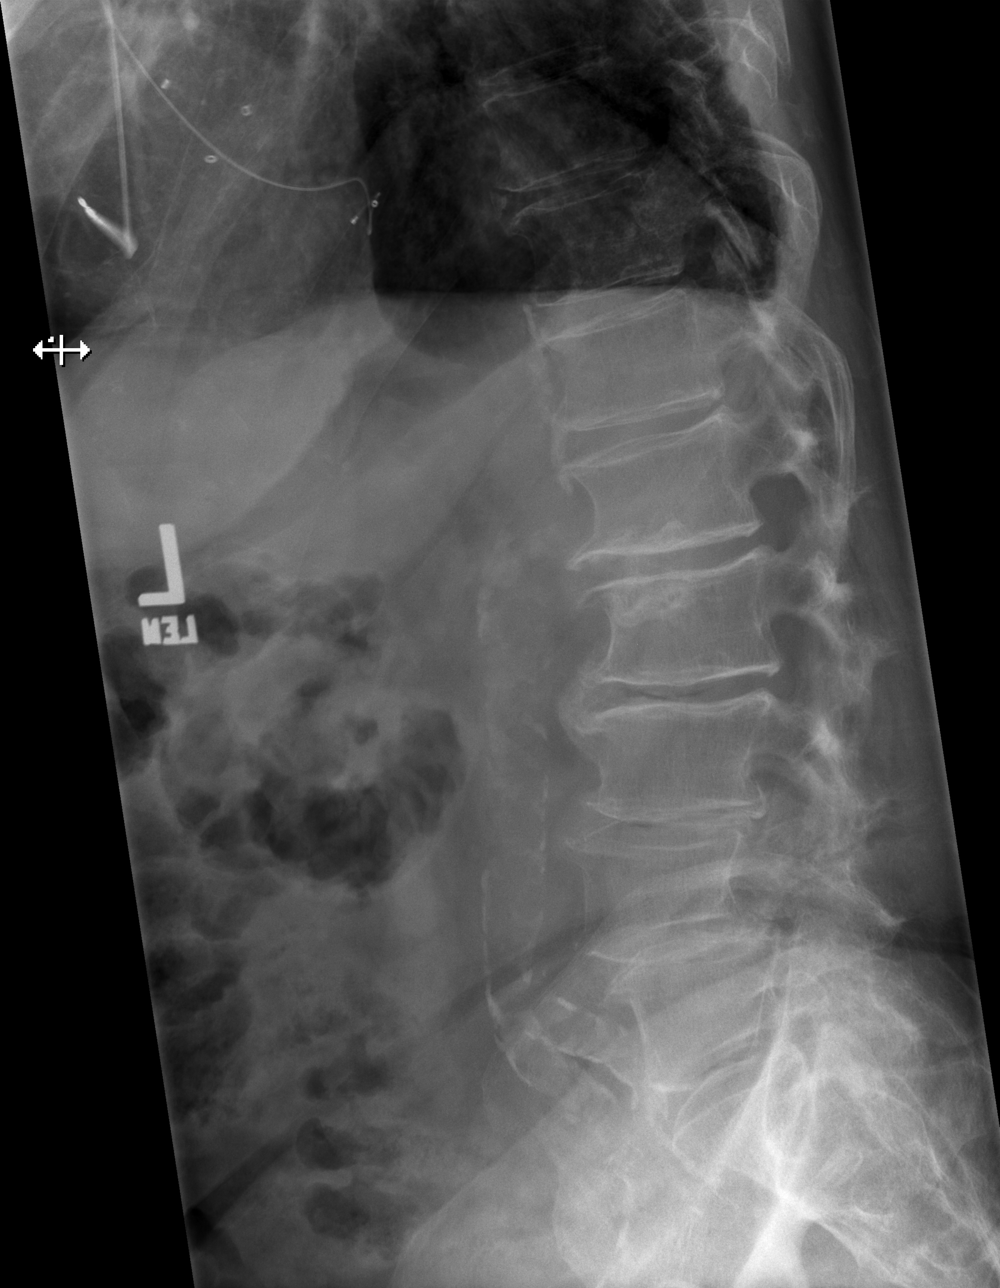

[t lumbar l-5 s-1 spot]
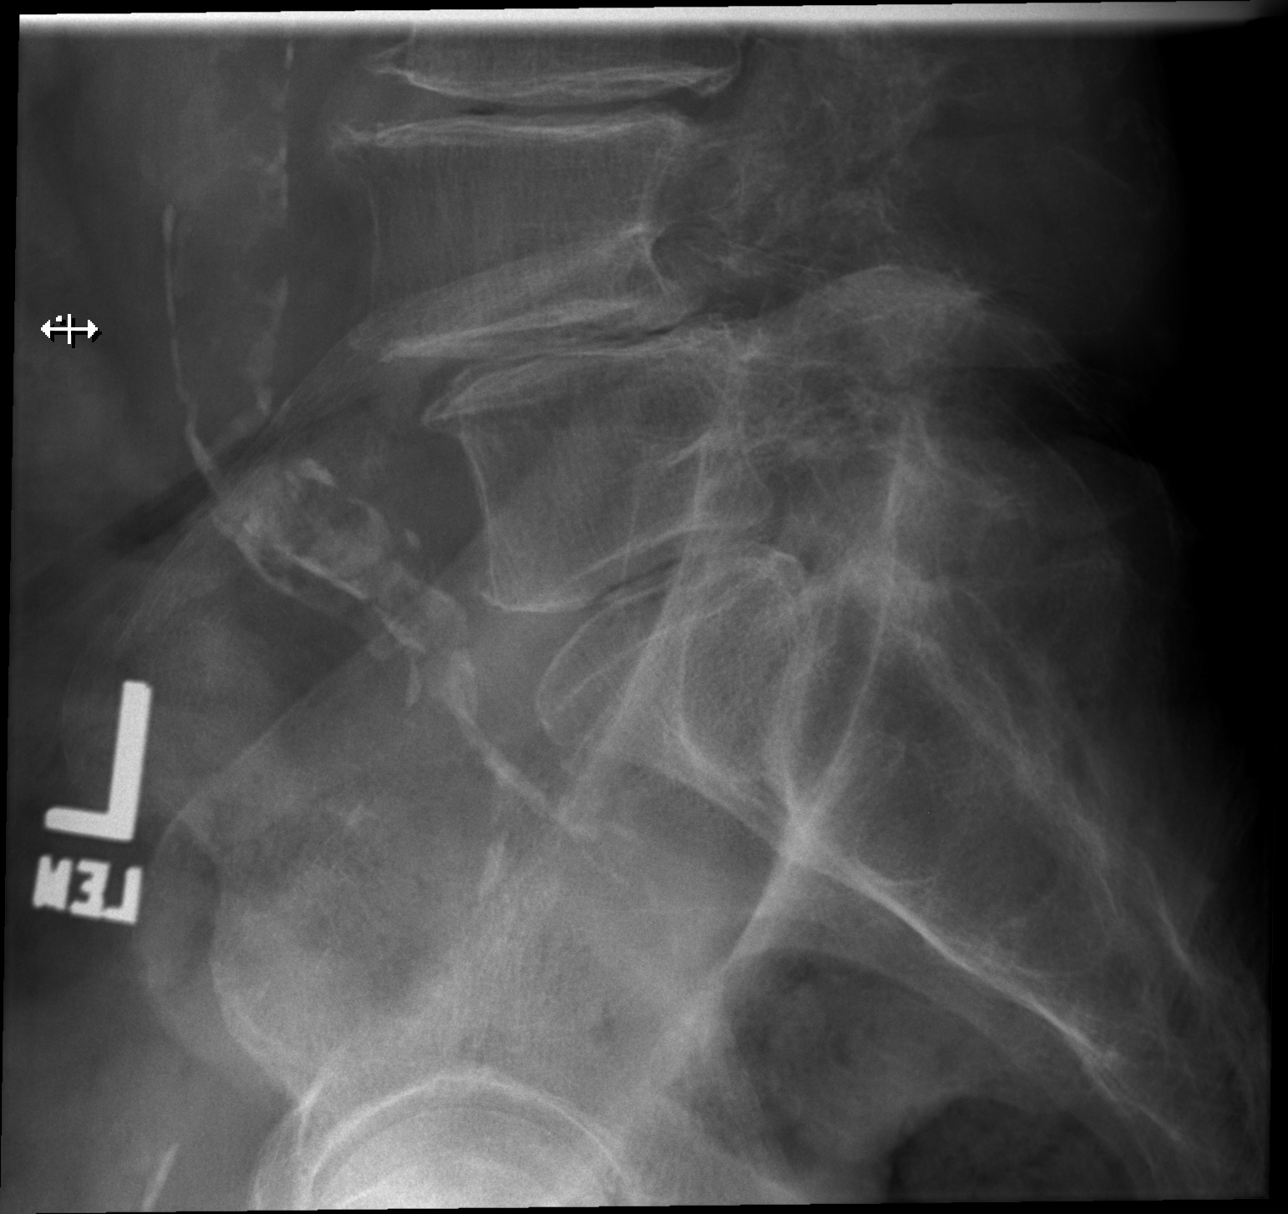

[5 of 5 positions shown; findings below may reference images not displayed]

FINDINGS: No fracture is identified. There is marked multilevel loss of disc
space height. The patient has bilateral L4 pars interarticularis
defects with associated 0.7 cm anterolisthesis L4 on L5, unchanged
since the prior CT. Paraspinous structures demonstrate extensive
atherosclerosis.
IMPRESSION: No acute abnormality.

Advanced multilevel spondylosis.

Bilateral L4 pars interarticularis defects with unchanged grade 1
anterolisthesis L4 on L5.

Convex right scoliosis.

## 2017-04-03 ENCOUNTER — Ambulatory Visit (HOSPITAL_COMMUNITY)
Admission: RE | Admit: 2017-04-03 | Discharge: 2017-04-03 | Disposition: A | Discharge status: Home / Self Care | Payer: Medicare Other | Source: Ambulatory Visit | Attending: Thoracic Surgery (Cardiothoracic Vascular Surgery) | Admitting: Thoracic Surgery (Cardiothoracic Vascular Surgery)

## 2017-04-03 ENCOUNTER — Ambulatory Visit: Payer: Medicare Other | Admitting: Thoracic Surgery (Cardiothoracic Vascular Surgery)

## 2017-04-03 DIAGNOSIS — I5042 Chronic combined systolic (congestive) and diastolic (congestive) heart failure: Secondary | ICD-10-CM

## 2017-04-03 DIAGNOSIS — J439 Emphysema, unspecified: Secondary | ICD-10-CM | POA: Diagnosis not present

## 2017-04-03 DIAGNOSIS — I700 Atherosclerosis of aorta: Secondary | ICD-10-CM | POA: Diagnosis not present

## 2017-04-03 DIAGNOSIS — I350 Nonrheumatic aortic (valve) stenosis: Secondary | ICD-10-CM | POA: Diagnosis not present

## 2017-04-03 LAB — POCT I-STAT CREATININE: Creatinine, Ser: 1.1 mg/dL (ref 0.61–1.24)

## 2017-04-03 MED ORDER — METOPROLOL TARTRATE 5 MG/5ML IV SOLN
INTRAVENOUS | Status: AC
  Administered 2017-04-03: 5 mg
  Filled 2017-04-03: qty 5

## 2017-04-03 MED ORDER — IOPAMIDOL (ISOVUE-370) INJECTION 76%
INTRAVENOUS | Status: AC
  Administered 2017-04-03: 100 mL
  Filled 2017-04-03: qty 100

## 2017-04-03 MED ORDER — NITROGLYCERIN 0.4 MG SL SUBL
SUBLINGUAL_TABLET | SUBLINGUAL | Status: AC
  Administered 2017-04-03: 0.4 mg
  Filled 2017-04-03: qty 2

## 2017-04-07 ENCOUNTER — Encounter: Payer: Self-pay | Admitting: Nurse Practitioner

## 2017-04-10 ENCOUNTER — Encounter: Payer: Self-pay | Admitting: Thoracic Surgery (Cardiothoracic Vascular Surgery)

## 2017-04-10 ENCOUNTER — Ambulatory Visit (INDEPENDENT_AMBULATORY_CARE_PROVIDER_SITE_OTHER): Payer: Medicare Other | Admitting: Thoracic Surgery (Cardiothoracic Vascular Surgery)

## 2017-04-10 ENCOUNTER — Other Ambulatory Visit: Payer: Self-pay

## 2017-04-10 VITALS — BP 118/67 | HR 74 | Ht 73.0 in | Wt 199.0 lb

## 2017-04-10 DIAGNOSIS — Z953 Presence of xenogenic heart valve: Secondary | ICD-10-CM | POA: Diagnosis not present

## 2017-04-10 DIAGNOSIS — I712 Thoracic aortic aneurysm, without rupture, unspecified: Secondary | ICD-10-CM

## 2017-04-10 DIAGNOSIS — Q2549 Other congenital malformations of aorta: Secondary | ICD-10-CM

## 2017-04-10 DIAGNOSIS — Q2543 Congenital aneurysm of aorta: Secondary | ICD-10-CM

## 2017-04-10 MED ORDER — BUDESONIDE-FORMOTEROL FUMARATE 160-4.5 MCG/ACT IN AERO: INHALATION_SPRAY | RESPIRATORY_TRACT | 2 refills | Status: DC

## 2017-04-10 NOTE — Patient Instructions (Signed)
Continue all previous medications without any changes at this time  

## 2017-04-10 NOTE — Progress Notes (Signed)
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 He was last seen in our office on 09/12/2016. Since then he underwent elective upper endoscopy by Dr. Ardis Hughs due to history of cirrhosis to rule out the possibility of underlying esophageal varices or other signs of portal hypertension on 09/29/2016.  He has been seen in follow-up by Dr. Elsworth Soho for management of COPD and Dr. Mckinley Jewel for follow-up of lung cancer.  More recently he has been seen in follow-up by Dr. Lovena Le in the EP clinic. The patient has reportedly remained quite stable and he returns to our office today stating that he feels well. He specifically denies any history of chest pain or chest tightness either with activity or at rest. He reports chronic shortness of breath that is no different than it has been in the past. He uses oxygen chronically.  Overall he has no complaints and the remainder of his review of systems is unrevealing.   Current Outpatient Prescriptions  Medication Sig Dispense Refill  . acetaminophen (TYLENOL) 500 MG tablet Take 500-1,000 mg by mouth every 6 (six) hours as needed for moderate pain or headache.    Marland Kitchen amoxicillin (AMOXIL) 500 MG capsule Take 2,000 mg by mouth as directed. 1 hour prior to dental procedures    . aspirin EC 81 MG tablet Take 1 tablet (81 mg total) by mouth daily. 90 tablet 3  . atorvastatin (LIPITOR) 10 MG tablet Take 10 mg by mouth at bedtime.    .  budesonide-formoterol (SYMBICORT) 160-4.5 MCG/ACT inhaler INHALE 2 PUFFS INTO THE LUNGS TWICE A DAY 30.6 Inhaler 2  . carvedilol (COREG) 3.125 MG tablet TAKE 1 TABLET BY MOUTH TWICE A DAY WITH MEALS 180 tablet 1  . furosemide (LASIX) 40 MG tablet Take 40 mg by mouth daily. May take 1 extra daily as needed for edema    . irbesartan (AVAPRO) 75 MG tablet TAKE 1 TABLET BY MOUTH EVERY DAY 90 tablet 3  . nitroGLYCERIN (NITROSTAT) 0.4 MG SL tablet Place 0.4 mg under the tongue every 5 (five) minutes as needed for chest pain (x 3 doses). Reported on 12/29/2015    . OXYGEN Inhale 2 L/min into the lungs continuous.    . metFORMIN (GLUCOPHAGE) 1000 MG tablet Take 1 tablet (1,000 mg total) by mouth 2 (two) times daily with a meal. 180 tablet 3   No current facility-administered medications for this visit.       Physical Exam:   BP 118/67   Pulse 74   Ht 6\' 1"  (1.854 m)   Wt 199 lb (90.3 kg)   SpO2 90% Comment: 2L O2  BMI 26.25 kg/m   General:  Chronically ill-appearing in good spirits in no acute distress  Chest:   Clear to auscultation  CV:   Regular rate and rhythm without murmur  Incisions:  n/a  Abdomen:  Soft nontender  Extremities:  Warm and  well-perfused  Diagnostic Tests:  Cardiac TAVR CT  TECHNIQUE: The patient was scanned on a [employer]. A 120 kV retrospective scan was triggered in the descending thoracic aorta at 111 HU's. Gantry rotation speed was 250 msecs and collimation was .6 mm. 5 mg of iv Metoprolol and 0.8 mg of sl nitro were given. The 3D data set was reconstructed in 5% intervals of the R-R cycle. Systolic and diastolic phases were analyzed on a dedicated work station using MPR, MIP and VRT modes. The patient received 80 cc of contrast.  COMPARISON:  CTA from 08/29/2016.  FINDINGS: Aortic Valve: Aortic root has a pseudoaneurysm of the right coronary  cusp measuring 36 x 32 mm. As a result the right coronary cusp is  severely dilated measuring  62 mm in diameter. There is a septum that  is partially dividing a right coronary cusp in the middle. RCA  originates above the pseudoaneurysm.  Sinus of Valsalva Measurements:  Non-coronary:  51 mm  Right-coronary:  62 mm  Left-coronary:  59 mm  Aorta: Aortic root is dilated as described above. Sinotubular  junction, ascending aorta, aortic arch and descending thoracic aorta  have normal size. There is no dissection. Mild diffuse  calcifications.  Sinotubular Junction:  34 x 31 mm  Ascending Thoracic Aorta:  34 x 33 mm  Aortic Arch:  29 x 28 mm  Descending Thoracic Aorta:  25 x 25 mm  Other findings:  Mitral valve prosthesis sits well in the mitral position.  Left atrium is massively dilated.  There is normal pulmonary vein drainage into the left atrium.  Left atrial appendage is large with no evidence of a thrombus.  Myocardium of the left ventricular apex is thinned with no evidence of a thrombus.  Pacemaker leads are seen in the right atrium and right ventricle.  IMPRESSION: 1. Stable size of the aortic root has a pseudoaneurysm of the right coronary cusp. There is no communication (fistula) seen with the adjacent tissues.  2. Sinotubular junction, ascending aorta, aortic arch and descending thoracic aorta have normal size and unchanged from the prior study. There is no dissection.  3. Mitral valve prosthesis sits well in the mitral position. Left atrium is massively dilated.  4. Myocardium of the left ventricular apex is thinned and mildly aneurysmal with no evidence of a thrombus.  5. Heavy calcifications of all three coronary arteries.  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 I have personally reviewed the recent follow-up cardiac gated CT angiogram and  compared it with the CT angiogram performed 6 months ago. There has been no interval change in size or anatomical characteristics. Furthermore, on review of the non-gated CT scan of the chest performed in February 2015 the aneurysm appears essentially stable.   Plan:  I have again reviewed the indications, risks, and potential benefits of elective surgical intervention for treatment of the patient's chronic aneurysm involving the sinus of Valsalva and compared risks of surgery with continued conservative management with close follow-up. The stability of the radiographic appearance of this aneurysm over time is clearly reassuring although no guarantee that the patient will never have a problem. The circumstances the patient remains disinclined to consider high risk surgical intervention, and I support his decision completely. We will plan another follow-up cardiac gated CT angiogram in 6 months to  reassess further stability.  The patient and his family have been advised regarding symptoms to be concerned about, most notably the development of severe substernal chest pain unrelieved by rest.  All questions answered.  I spent in excess of 30 minutes during the conduct of this office consultation and >50% of this time involved direct face-to-face encounter with the patient for counseling and/or coordination of their care.  Valentina Gu. Roxy Manns, MD 04/10/2017 3:33 PM

## 2017-04-11 ENCOUNTER — Other Ambulatory Visit (INDEPENDENT_AMBULATORY_CARE_PROVIDER_SITE_OTHER): Payer: Medicare Other

## 2017-04-11 ENCOUNTER — Encounter: Payer: Self-pay | Admitting: Gastroenterology

## 2017-04-11 ENCOUNTER — Ambulatory Visit (INDEPENDENT_AMBULATORY_CARE_PROVIDER_SITE_OTHER): Payer: Medicare Other | Admitting: Gastroenterology

## 2017-04-11 VITALS — BP 118/64 | HR 76 | Ht 73.0 in | Wt 199.0 lb

## 2017-04-11 DIAGNOSIS — K7460 Unspecified cirrhosis of liver: Secondary | ICD-10-CM

## 2017-04-11 LAB — COMPREHENSIVE METABOLIC PANEL
ALT: 9 U/L (ref 0–53)
AST: 10 U/L (ref 0–37)
Albumin: 4.3 g/dL (ref 3.5–5.2)
Alkaline Phosphatase: 52 U/L (ref 39–117)
BUN: 20 mg/dL (ref 6–23)
CALCIUM: 9.8 mg/dL (ref 8.4–10.5)
CHLORIDE: 102 meq/L (ref 96–112)
CO2: 31 meq/L (ref 19–32)
Creatinine, Ser: 1.06 mg/dL (ref 0.40–1.50)
GFR: 73.44 mL/min (ref >60.00)
Glucose, Bld: 138 mg/dL — ABNORMAL HIGH (ref 70–99)
POTASSIUM: 5.4 meq/L — AB (ref 3.5–5.1)
Sodium: 141 mEq/L (ref 135–145)
Total Bilirubin: 3.1 mg/dL — ABNORMAL HIGH (ref 0.2–1.2)
Total Protein: 6.8 g/dL (ref 6.0–8.3)

## 2017-04-11 LAB — CBC WITH DIFFERENTIAL/PLATELET
BASOS PCT: 0.8 % (ref 0.0–3.0)
Basophils Absolute: 0.1 10*3/uL (ref 0.0–0.1)
Eosinophils Absolute: 0.2 10*3/uL (ref 0.0–0.7)
Eosinophils Relative: 2 % (ref 0.0–5.0)
HEMATOCRIT: 48 % (ref 39.0–52.0)
Hemoglobin: 15.5 g/dL (ref 13.0–17.0)
LYMPHS PCT: 8.2 % — AB (ref 12.0–46.0)
Lymphs Abs: 0.6 10*3/uL — ABNORMAL LOW (ref 0.7–4.0)
MCHC: 32.4 g/dL (ref 30.0–36.0)
MCV: 94 fl (ref 78.0–100.0)
MONOS PCT: 9.8 % (ref 3.0–12.0)
Monocytes Absolute: 0.8 10*3/uL (ref 0.1–1.0)
NEUTROS ABS: 6.2 10*3/uL (ref 1.4–7.7)
Neutrophils Relative %: 79.2 % — ABNORMAL HIGH (ref 43.0–77.0)
PLATELETS: 169 10*3/uL (ref 150.0–400.0)
RBC: 5.11 Mil/uL (ref 4.22–5.81)
RDW: 15.6 % — AB (ref 11.5–15.5)
WBC: 7.8 10*3/uL (ref 4.0–10.5)

## 2017-04-11 LAB — PROTIME-INR
INR: 1.2 ratio — ABNORMAL HIGH (ref 0.8–1.0)
PROTHROMBIN TIME: 12.6 s (ref 9.6–13.1)

## 2017-04-11 NOTE — Progress Notes (Signed)
Review of pertinent gastrointestinal problems: 1. Cirrhosis: Originally seen by AE and Dr. Deatra Ina 2015: strong history of alcohol abuse and they suspected that was the cause. Viral serologies were drawn and they were all negative.. He began immunization series for hepatitis A and B.  Normal PLTs, normal INR  Most recent imaging [place] 07/2016 cirrhosis, no masses.  Most recent AFP 07/2016  normal  Most recent EGD 09/2016; no signs of portal hypertension; + gastritis, no h pylori (next around 09/2018)  Chronically elevated T bili (Gilberts' and cirrhosis mixed)  2. Personal history of adenomatous polyps:  Colonoscopy Dr. Deatra Ina 03/2007; several polyps removed; lipomas, HP and a single TA.  Surveillance deferred 2018 due to significant comorbid conditions (home o2, copd, cirrhosis, CHF, afib, defib placed)  HPI: This is a very pleasant 71 year old man whom I last saw about 7 months ago.  He has been very well from a liver perspective. He has noticed no swelling in his legs, swelling in his belly. No overt GI bleeding. No symptoms of encephalopathy.  Chief complaint is cirrhosis  ROS: complete GI ROS as described in HPI, all other review negative.  Constitutional:  No unintentional weight loss  Weight is up 1 pound in 7 months (same scale here in GI office).   Past Medical History:  Diagnosis Date  . Atrial fibrillation ([place])    AV Node ablation January, 2010, for rapid atrial fib  . Atrial septal defect    Closed with surgery January, 2010  . Automatic implantable cardioverter-defibrillator in situ    LV dysfunction and pacer needed for AV node lesion  . Cardiomyopathy    non-ischemic  . CHF (congestive heart failure) ([place])   . Chronic combined systolic and diastolic CHF (congestive heart failure) ([place])   . Colon polyps   . COPD (chronic obstructive pulmonary disease) (HCC)    O2- 2 liters, nasal cannula, q night   . COPD GOLD II 01/11/2007   PFT's 09/25/13  FEV1  2.18 (61%) ratio 64 no  change p B2 and DLC0  32% corrects to 68% - trial off advair and acei rec starting  08/23/2013     . CVA (cerebral vascular accident) ([place]) 2009   denies residual on 08/14/2013  . Diabetes mellitus without complication ([place])    type 2  . Dyslipidemia   . Dysrhythmia   . Ejection fraction < 50%   . Endocarditis    Bacterial, 2009  . Headache(784.0)    related to stroke only  . HLD (hyperlipidemia)   . Hypertension   . Intracranial hemorrhage (HCC)    Coumadin cannot be used because of the history of his bleed  . Lung cancer ([place]) 11/29/2013   T1N0 Stage Ia non-small cell carcinoma left lung treated with wedge resection  . Mitral regurgitation    Severe symptomatic primary MR due to bacterial endocarditis, treated w/ MVR // Echo 1/18 EF 40-45, diffuse HK, dilated aorta at 42 mm/aortic root 47 mm; linear echogenic structure in ascending aorta - suspect reverberation artifact / consider CT or TEE to rule out dissection flap, bioprosthetic MVR with mean gradient 3, severe LAE, low normal RVSF, severe RAE, moderate TR, PASP 42  . Myocardial infarction ([place]) 2010  . Pacemaker    combo pacer and icd  . Permanent atrial fibrillation    Originally Coumadin use for atrial fibrillation  //   he had intracerebral hemorrhage with an INR of 2.3 June, 2009. Anticoagulation could no longer be used.  //  Rapid atrial fibrillation after inferior MI October, 2010..........Marland Kitchen AV node ablation done at that time with ICD pacemaker placed (EF 35%).   //   Left atrial appendage tied off at the time of mitral valve surgery January, 2010 (maze pro  . Prosthetic valve dysfunction    Mild mitral stenosis  . Pulmonary hypertension (HCC)    Moderate  . Renal artery stenosis (HCC)    Mild by history  . 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 Spontaneous pneumothorax    right thoracotomy - distant past  . Status post minimally invasive mitral valve replacement with bioprosthetic valve    33 mm Medtronic Mosaic porcine  bioprosthesis placed via right mini thoracotomy for bacterial endocarditis complicated by severe MR and CHF   . Thoracic aortic aneurysm ([place]) 08/11/2016   a - Chest CTA 1/18:  Aneurysmal dilatation of aortic root is noted at 5.1 cm.     Past Surgical History:  Procedure Laterality Date  . APPENDECTOMY    . ASD REPAIR, SECUNDUM  07/17/2008   pericardial patch closure of ASD  . AV NODE ABLATION  07/2008   for rapid atrial fib  . CARDIAC CATHETERIZATION    . CARDIAC DEFIBRILLATOR PLACEMENT  ~ [address] [place]  . CARDIAC VALVE REPLACEMENT    . CATARACT EXTRACTION W/ INTRAOCULAR LENS  IMPLANT, BILATERAL Bilateral   . ESOPHAGOGASTRODUODENOSCOPY (EGD) WITH PROPOFOL N/A 09/29/2016   Procedure: ESOPHAGOGASTRODUODENOSCOPY (EGD) WITH PROPOFOL;  Surgeon: [name], MD;  Location: [facility] ENDOSCOPY;  Service: Endoscopy;  Laterality: N/A;  . HERNIA REPAIR    . IMPLANTABLE CARDIOVERTER DEFIBRILLATOR (ICD) GENERATOR CHANGE N/A 02/06/2012   Procedure: ICD GENERATOR CHANGE;  Surgeon: [name], MD;  Location: [facility] CATH LAB;  Service: Cardiovascular;  Laterality: N/A;  . INSERT / REPLACE / REMOVE PACEMAKER    . MASS BIOPSY Left    neck mass  . MITRAL VALVE REPLACEMENT Right 07/17/2008   91mm Medtronic Mosaic porcine bioprosthesis  . PENILE PROSTHESIS IMPLANT    . RIGHT HEART CATHETERIZATION N/A 08/16/2013   Procedure: RIGHT HEART CATH;  Surgeon: Josue Hector, MD;  Location: [facility] CATH LAB;  Service: Cardiovascular;  Laterality: N/A;  . TEE WITHOUT CARDIOVERSION N/A 09/06/2016   Procedure: TRANSESOPHAGEAL ECHOCARDIOGRAM (TEE);  Surgeon: Dorothy Spark, MD;  Location: [facility] ENDOSCOPY;  Service: Cardiovascular;  Laterality: N/A;  . THORACOTOMY Right 1970's   spontaneous pneumothorax - while in the [place]  . TOE SURGERY     left foot hammer toe  . TONSILLECTOMY    . VIDEO ASSISTED THORACOSCOPY (VATS)/WEDGE RESECTION Left 11/29/2013   Procedure: Video assisted thoracoscopy for wedge resection; mini thoracotomy;   Surgeon: [name], MD;  Location: [facility] OR;  Service: Thoracic;  Laterality: Left;    Current Outpatient Prescriptions  Medication Sig Dispense Refill  . acetaminophen (TYLENOL) 500 MG tablet Take 500-1,000 mg by mouth every 6 (six) hours as needed for moderate pain or headache.    Marland Kitchen amoxicillin (AMOXIL) 500 MG capsule Take 2,000 mg by mouth as directed. 1 hour prior to dental procedures    . aspirin EC 81 MG tablet Take 1 tablet (81 mg total) by mouth daily. 90 tablet 3  . atorvastatin (LIPITOR) 10 MG tablet Take 10 mg by mouth at bedtime.    . budesonide-formoterol (SYMBICORT) 160-4.5 MCG/ACT inhaler INHALE 2 PUFFS INTO THE LUNGS TWICE A DAY 30.6 Inhaler 2  . carvedilol (COREG) 3.125 MG tablet TAKE 1 TABLET BY MOUTH TWICE A DAY  WITH MEALS 180 tablet 1  . furosemide (LASIX) 40 MG tablet Take 40 mg by mouth daily. May take 1 extra daily as needed for edema    . irbesartan (AVAPRO) 75 MG tablet TAKE 1 TABLET BY MOUTH EVERY DAY 90 tablet 3  . nitroGLYCERIN (NITROSTAT) 0.4 MG SL tablet Place 0.4 mg under the tongue every 5 (five) minutes as needed for chest pain (x 3 doses). Reported on 12/29/2015    . OXYGEN Inhale 2 L/min into the lungs continuous.    . metFORMIN (GLUCOPHAGE) 1000 MG tablet Take 1 tablet (1,000 mg total) by mouth 2 (two) times daily with a meal. 180 tablet 3   No current facility-administered medications for this visit.     Allergies as of 04/11/2017 - Review Complete 04/11/2017  Allergen Reaction Noted  . Anticoagulant compound Other (See Comments) 08/12/2013  . Warfarin sodium Other (See Comments)     Family History  Problem Relation Age of Onset  . Stomach cancer Father   . Stroke Mother     Social History   Social History  . Marital status: Married    Spouse name: [name]  . Number of children: 0  . Years of education: College   Occupational History  . Retired Retired    Tour manager   Social History Main Topics  . Smoking status: Former Smoker     Packs/day: 1.00    Years: 45.00    Types: Cigarettes    Quit date: 08/11/2013  . Smokeless tobacco: Never Used     Comment: 08/14/2013 "quit smoking in 2009"  . Alcohol use No     Comment: 08/14/2013 "used to drink beer; quit:in 1982"  . Drug use: No  . Sexual activity: Yes   Other Topics Concern  . Not on file   Social History Narrative   Patient lives at home with his spouse.   Caffeine Use: none   Worked for the [employer]   Has two boys and a girl. All live local.      Physical Exam: BP 118/64   Pulse 76   Ht 6\' 1"  (1.854 m)   Wt 199 lb (90.3 kg)   BMI 26.25 kg/m  Constitutional: Chronically ill-appearing with oxygen nasal cannula Psychiatric: alert and oriented x3 Abdomen: soft, nontender, nondistended, no obvious ascites, no peritoneal signs, normal bowel sounds No peripheral edema noted in lower extremities  Assessment and plan: 71 y.o. male with compensated cirrhosis  His liver seems to be functioning well. He has had normal platelets, normal coags. We will recheck liver function tests today including a CBC, complete metabolic profile, coags. He is due for hepatoma screening with alpha-fetoprotein level as well as abdominal ultrasound the liver. He has no symptoms of decompensated liver disease. If his labs and ultrasound check out well and I would not need to see him again for about a year. He will need hepatoma screening every 6 months.  Please see the "Patient Instructions" section for addition details about the plan.  Owens Loffler, MD [facility] 04/11/2017, 10:55 AM

## 2017-04-11 NOTE — Patient Instructions (Addendum)
You have been scheduled for an abdominal ultrasound at [facility] (1st floor of hospital) on 04/13/17 at 9 am. Please arrive 15 minutes prior to your appointment for registration. Make certain not to have anything to eat or drink 6 hours prior to your appointment. Should you need to reschedule your appointment, please contact radiology at [phone]. This test typically takes about 30 minutes to perform.  You will have labs checked today in the basement lab.  Please head down after you check out with the front desk  (cbc, cmet, inr, AFP).  Please return to see Dr. Ardis Hughs in 12 months.  Normal BMI (Body Mass Index- based on height and weight) is between 23 and 30. Your BMI today is Body mass index is 26.25 kg/m. Marland Kitchen Please consider follow up  regarding your BMI with your Primary Care Provider.

## 2017-04-12 LAB — AFP TUMOR MARKER: AFP-Tumor Marker: 1.3 ng/mL (ref <6.1)

## 2017-04-13 ENCOUNTER — Ambulatory Visit (HOSPITAL_COMMUNITY)
Admission: RE | Admit: 2017-04-13 | Discharge: 2017-04-13 | Disposition: A | Discharge status: Home / Self Care | Payer: Medicare Other | Source: Ambulatory Visit | Attending: Gastroenterology | Admitting: Gastroenterology

## 2017-04-13 DIAGNOSIS — K7460 Unspecified cirrhosis of liver: Secondary | ICD-10-CM | POA: Diagnosis not present

## 2017-04-13 DIAGNOSIS — R932 Abnormal findings on diagnostic imaging of liver and biliary tract: Secondary | ICD-10-CM | POA: Insufficient documentation

## 2017-04-18 ENCOUNTER — Other Ambulatory Visit: Payer: Self-pay

## 2017-04-18 DIAGNOSIS — E875 Hyperkalemia: Secondary | ICD-10-CM

## 2017-04-19 ENCOUNTER — Encounter: Payer: Self-pay | Admitting: Nurse Practitioner

## 2017-04-19 ENCOUNTER — Ambulatory Visit (INDEPENDENT_AMBULATORY_CARE_PROVIDER_SITE_OTHER): Payer: Medicare Other | Admitting: Nurse Practitioner

## 2017-04-19 VITALS — BP 118/60 | HR 76 | Ht 72.0 in | Wt 201.8 lb

## 2017-04-19 DIAGNOSIS — I4821 Permanent atrial fibrillation: Secondary | ICD-10-CM

## 2017-04-19 DIAGNOSIS — I428 Other cardiomyopathies: Secondary | ICD-10-CM | POA: Diagnosis not present

## 2017-04-19 DIAGNOSIS — I5022 Chronic systolic (congestive) heart failure: Secondary | ICD-10-CM | POA: Diagnosis not present

## 2017-04-19 DIAGNOSIS — Z79899 Other long term (current) drug therapy: Secondary | ICD-10-CM

## 2017-04-19 DIAGNOSIS — I712 Thoracic aortic aneurysm, without rupture, unspecified: Secondary | ICD-10-CM

## 2017-04-19 DIAGNOSIS — I482 Chronic atrial fibrillation: Secondary | ICD-10-CM | POA: Diagnosis not present

## 2017-04-19 NOTE — Patient Instructions (Addendum)
We will be checking the following labs today - NONE   Medication Instructions:    Continue with your current medicines.     Testing/Procedures To Be Arranged:  N/A  Follow-Up:   See Dr. Marlou Porch in 6 months     Other Special Instructions:   Keep up the good work!!    If you need a refill on your cardiac medications before your next appointment, please call your pharmacy.   Call the [place] office at [phone] if you have any questions, problems or concerns.

## 2017-04-19 NOTE — Progress Notes (Signed)
CARDIOLOGY OFFICE NOTE  Date:  04/19/2017    [name] Date of Birth: 1946-07-02 Medical Record #[id number]  PCP:  [name], NP  Cardiologist:  Marisa Cyphers  Chief Complaint  Patient presents with  . Cardiomyopathy  . Atrial Fibrillation  . Coronary Artery Disease    Follow up visit - seen for Dr. Marlou Porch    History of Present Illness: [name] is a 71 y.o. male who presents today for a follow up visit. Seen for Dr. Marlou Porch.  He has a hx of mitral regurgitation due to endocarditis s/p bioprosthetic MVR, permanent AF, intracerebral hemorrhage in 2009 (anticoagulation stopped), systolic CHF, NICM with EF 35%, inferior MI in 2009, prior CVA, COPD on chronic O2, Lung CA s/p wedge resection - followed by Dr. Earlie Server with oncology, pulmonary HTN (Dr. Elsworth Soho). He underwent AV node ablation due uncontrolled atrial fibrillation in 2010 with placement of BiV-ICD. He also underwent LA appendage clipping at the time of his MV surgery. LHC prior to MVR demonstrated normal coronaries (etiology of MI in 2009 unknown).   Admitted in 3/17 for near syncope related to orthostatic hypotension.   Last seen by Dr. Candee Furbish in 6/17. Saw [name], [place] back in December of 2017 - he was doing well. His echo was updated - this led to having a CT scan due to possible aneurysm - ended up getting referred back to Dr. Roxy Manns - he notes that the "right coronary artery appears to be displaced in a cephalad direction and originating from the true lumen of the aorta. The aortic valve itself is trileaflet and functioning normally. All of these findings are not likely to be consistent with a chronic localized dissection, and under the circumstances it seems likely that this false aneurysm developed as a complication of the patient's previous endocarditis. The false aneurysmis nowhere near the patient's mitral valve prosthesisand therefore probably not related to his surgery. It has  clearly been present since 2015 when the patient first underwent non-gated CT scan of the chest. It is unclear whether or not the false aneurysm may be enlarging. The patient is noted to have severely reduced diffusion capacity with chronic hypoxemia at baseline for which he is oxygen dependent. He did very poorly with his 6 minute walk test and had to stop in less than 3 minutes due to severe hypoxemia during ambulation despite the fact that he was ambulating using continuous oxygen at 2 L/m. There is no question that with any major surgical procedure the patient would be at very high risk for the development of postoperative respiratory failure."  I last saw him back in June - he was doing well. Saw Dr. Lovena Le in August - noted about 3 years of battery life left - may be considering downgrading his device.    He has seen Dr. 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 No interval change. Noted that the stability was reassuring although no guarantee that Mr. Kincy will never have a problem. He has continue to decline surgical intervention. Will have repeat scan in 6 months.   Comes in today. Here alone. Has his oxygen in place. Says he is doing fine. He continues to go to the mall to walk. Walking his dog. No chest pain. Stable shortness of breath. Not dizzy or lightheaded. Seeing his PCP next week - will go fasting - no recent lipids.  Tolerating his medicines. He feels like he is doing well and has no real concerns. His spirit seems good as well.   Past Medical History:  Diagnosis Date  . Atrial fibrillation ([place])    AV Node ablation January, 2010, for rapid atrial fib  . Atrial septal defect    Closed with surgery January, 2010  . Automatic implantable cardioverter-defibrillator in situ    LV dysfunction and pacer needed for AV node lesion  . Cardiomyopathy    non-ischemic  . CHF (congestive heart  failure) ([place])   . Chronic combined systolic and diastolic CHF (congestive heart failure) ([place])   . Colon polyps   . COPD (chronic obstructive pulmonary disease) (H[facility])    O2- 2 liters, nasal cannula, q night   . COPD GOLD II 01/11/2007   PFT's 09/25/13  FEV1  2.18 (61%) ratio 64 no change p B2 and DLC0  32% corrects to 68% - trial off advair and acei rec starting  08/23/2013     . CVA (cerebral vascular accident) ([place]) 2009   denies residual on 08/14/2013  . Diabetes mellitus without complication ([place])    type 2  . Dyslipidemia   . Dysrhythmia   . Ejection fraction < 50%   . Endocarditis    Bacterial, 2009  . Headache(784.0)    related to stroke only  . HLD (hyperlipidemia)   . Hypertension   . Intracranial hemorrhage (H[facility])    Coumadin cannot be used because of the history of his bleed  . Lung cancer ([place]) 11/29/2013   T1N0 Stage Ia non-small cell carcinoma left lung treated with wedge resection  . Mitral regurgitation    Severe symptomatic primary MR due to bacterial endocarditis, treated w/ MVR // Echo 1/18 EF 40-45, diffuse HK, dilated aorta at 42 mm/aortic root 47 mm; linear echogenic structure in ascending aorta - suspect reverberation artifact / consider CT or TEE to rule out dissection flap, bioprosthetic MVR with mean gradient 3, severe LAE, low normal RVSF, severe RAE, moderate TR, PASP 42  . Myocardial infarction ([place]) 2010  . Pacemaker    combo pacer and icd  . Permanent atrial fibrillation    Originally Coumadin use for atrial fibrillation  //   he had intracerebral hemorrhage with an INR of 2.3 June, 2009. Anticoagulation could no longer be used.  //  Rapid atrial fibrillation after inferior MI October, 2010..........Marland Kitchen AV node ablation done at that time with ICD pacemaker placed (EF 35%).   //   Left atrial appendage tied off at the time of mitral valve surgery January, 2010 (maze pro  . Prosthetic valve dysfunction    Mild mitral stenosis  . Pulmonary hypertension (HCC)     Moderate  . Renal artery stenosis (HCC)    Mild by history  . 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 Spontaneous pneumothorax    right thoracotomy - distant past  . Status post minimally invasive mitral valve replacement with bioprosthetic valve    33 mm Medtronic Mosaic porcine bioprosthesis placed via right mini thoracotomy for bacterial endocarditis complicated by severe MR and CHF   . Thoracic aortic aneurysm ([place]) 08/11/2016   a - Chest CTA 1/18:  Aneurysmal dilatation of aortic root is noted at 5.1 cm.     Past Surgical History:  Procedure Laterality Date  . APPENDECTOMY    . ASD REPAIR, SECUNDUM  07/17/2008   pericardial patch closure of ASD  . AV NODE ABLATION  07/2008   for rapid atrial  fib  . CARDIAC CATHETERIZATION    . CARDIAC DEFIBRILLATOR PLACEMENT  ~ [address] [place]  . CARDIAC VALVE REPLACEMENT    . CATARACT EXTRACTION W/ INTRAOCULAR LENS  IMPLANT, BILATERAL Bilateral   . ESOPHAGOGASTRODUODENOSCOPY (EGD) WITH PROPOFOL N/A 09/29/2016   Procedure: ESOPHAGOGASTRODUODENOSCOPY (EGD) WITH PROPOFOL;  Surgeon: [name], MD;  Location: [facility] ENDOSCOPY;  Service: Endoscopy;  Laterality: N/A;  . HERNIA REPAIR    . IMPLANTABLE CARDIOVERTER DEFIBRILLATOR (ICD) GENERATOR CHANGE N/A 02/06/2012   Procedure: ICD GENERATOR CHANGE;  Surgeon: [name], MD;  Location: [facility] CATH LAB;  Service: Cardiovascular;  Laterality: N/A;  . INSERT / REPLACE / REMOVE PACEMAKER    . MASS BIOPSY Left    neck mass  . MITRAL VALVE REPLACEMENT Right 07/17/2008   77mm Medtronic Mosaic porcine bioprosthesis  . PENILE PROSTHESIS IMPLANT    . RIGHT HEART CATHETERIZATION N/A 08/16/2013   Procedure: RIGHT HEART CATH;  Surgeon: Josue Hector, MD;  Location: [facility] CATH LAB;  Service: Cardiovascular;  Laterality: N/A;  . TEE WITHOUT CARDIOVERSION N/A 09/06/2016   Procedure: TRANSESOPHAGEAL ECHOCARDIOGRAM (TEE);  Surgeon: Dorothy Spark, MD;  Location: [facility] ENDOSCOPY;  Service: Cardiovascular;  Laterality: N/A;  .  THORACOTOMY Right 1970's   spontaneous pneumothorax - while in the [place]  . TOE SURGERY     left foot hammer toe  . TONSILLECTOMY    . VIDEO ASSISTED THORACOSCOPY (VATS)/WEDGE RESECTION Left 11/29/2013   Procedure: Video assisted thoracoscopy for wedge resection; mini thoracotomy;  Surgeon: [name], MD;  Location: [facility] OR;  Service: Thoracic;  Laterality: Left;     Medications: Current Meds  Medication Sig  . acetaminophen (TYLENOL) 500 MG tablet Take 500-1,000 mg by mouth every 6 (six) hours as needed for moderate pain or headache.  Marland Kitchen amoxicillin (AMOXIL) 500 MG capsule Take 2,000 mg by mouth as directed. 1 hour prior to dental procedures  . aspirin EC 81 MG tablet Take 1 tablet (81 mg total) by mouth daily.  Marland Kitchen atorvastatin (LIPITOR) 10 MG tablet Take 10 mg by mouth at bedtime.  . budesonide-formoterol (SYMBICORT) 160-4.5 MCG/ACT inhaler INHALE 2 PUFFS INTO THE LUNGS TWICE A DAY  . carvedilol (COREG) 3.125 MG tablet TAKE 1 TABLET BY MOUTH TWICE A DAY WITH MEALS  . furosemide (LASIX) 40 MG tablet Take 40 mg by mouth daily. May take 1 extra daily as needed for edema  . irbesartan (AVAPRO) 75 MG tablet TAKE 1 TABLET BY MOUTH EVERY DAY  . nitroGLYCERIN (NITROSTAT) 0.4 MG SL tablet Place 0.4 mg under the tongue every 5 (five) minutes as needed for chest pain (x 3 doses). Reported on 12/29/2015  . OXYGEN Inhale 2 L/min into the lungs continuous.     Allergies: Allergies  Allergen Reactions  . Anticoagulant Compound Other (See Comments)    Pt had intracranial bleed, therefore all anticoagulation is contra-indicated per Dr. Ron Parker.  . Warfarin Sodium Other (See Comments)    Pt had intracranial bleed, therefore all anticoagulation is contra-indicated per Dr. Ron Parker.     Social History: The patient  reports that he quit smoking about 3 years ago. His smoking use included Cigarettes. He has a 45.00 pack-year smoking history. He has never used smokeless tobacco. He reports that he does  not drink alcohol or use drugs.   Family History: The patient's family history includes Stomach cancer in his father; Stroke in his mother.   Review of Systems: Please see the history of present illness.  Otherwise, the review of systems is positive for none.   All other systems are reviewed and negative.   Physical Exam: VS:  BP 118/60 (BP Location: Left Arm, Patient Position: Sitting, Cuff Size: Normal)   Pulse 76   Ht 6' (1.829 m)   Wt 201 lb 12.8 oz (91.5 kg)   SpO2 (!) 88%   BMI 27.37 kg/m  .  BMI Body mass index is 27.37 kg/m.  Wt Readings from Last 3 Encounters:  04/19/17 201 lb 12.8 oz (91.5 kg)  04/11/17 199 lb (90.3 kg)  04/10/17 199 lb (90.3 kg)    General: Pleasant. Chronically ill appearing but alert and in no acute distress.  Oxygen in place.  HEENT: Normal.  Neck: Supple, no JVD, carotid bruits, or masses noted.  Cardiac: Regular rate and rhythm. No murmurs, rubs, or gallops. No edema. Lots of varicosities.  Respiratory:  Lungs are clear to auscultation bilaterally with normal work of breathing.  GI: Soft and nontender.  MS: No deformity or atrophy. Gait and ROM intact.  Skin: Warm and dry. Color is normal.  Neuro:  Strength and sensation are intact and no gross focal deficits noted.  Psych: Alert, appropriate and with normal affect.   LABORATORY DATA:  EKG:  EKG is not ordered today.  Lab Results  Component Value Date   WBC 7.8 04/11/2017   HGB 15.5 04/11/2017   HCT 48.0 04/11/2017   PLT 169.0 04/11/2017   GLUCOSE 138 (H) 04/11/2017   CHOL 107 10/19/2015   TRIG 164.0 (H) 10/19/2015   HDL 35.40 (L) 10/19/2015   LDLDIRECT 80.2 03/31/2011   LDLCALC 39 10/19/2015   ALT 9 04/11/2017   AST 10 04/11/2017   NA 141 04/11/2017   K 5.4 (H) 04/11/2017   CL 102 04/11/2017   CREATININE 1.06 04/11/2017   BUN 20 04/11/2017   CO2 31 04/11/2017   TSH 0.592 09/24/2015   PSA 0.94 07/01/2013   INR 1.2 (H) 04/11/2017   HGBA1C 7.7 10/25/2016   MICROALBUR 3.2  (H) 01/22/2015     BNP (last 3 results) No results for input(s): BNP in the last 8760 hours.  ProBNP (last 3 results) No results for input(s): PROBNP in the last 8760 hours.   Other Studies Reviewed Today:  CT CORONARY IMPRESSION 03/2017: 1. Stable size of the aortic root has a pseudoaneurysm of the right coronary cusp. There is no communication (fistula) seen with the adjacent tissues.  2. Sinotubular junction, ascending aorta, aortic arch and descending thoracic aorta have normal size and unchanged from the prior study. There is no dissection.  3. Mitral valve prosthesis sits well in the mitral position. Left atrium is massively dilated.  4. Myocardium of the left ventricular apex is thinned and mildly aneurysmal with no evidence of a thrombus.  5. Heavy calcifications of all three coronary arteries.  Ena Dawley   Electronically Signed   By: Ena Dawley   On: 04/04/2017 12:31   Echo Study Conclusions 07/2016  - Left ventricle: The cavity size was mildly dilated. Wall thickness was normal. Systolic function was mildly to moderately reduced. The estimated ejection fraction was in the range of 40% to 45%. Diffuse hypokinesis. The study is not technically sufficient to allow evaluation of LV diastolic function. - Aorta: Dilated aorta. Linear echogenic structure seen in the ascending aorta - suspect reverberation artifact. Consider CT or TEE to definitively r/o dissection flap. Aortic root dimension: 42 mm (ED, M-mode). Aortic root dimension: 47 mm (ED). - Mitral  valve: Bioprosthetic MVR. No obstruction. Mean gradient (D): 3 mm Hg. Valve area by pressure half-time: 1.67 cm^2. Valve area by continuity equation (using LVOT flow): 1.9 cm^2. - Left atrium: Severely dilated. - Right ventricle: The cavity size was moderately dilated. Pacer wire or catheter noted in right ventricle. Systolic function was low normal. - Right atrium:  Severely dilated. Pacer wire or catheter noted in right atrium. - Tricuspid valve: There was moderate regurgitation. - Pulmonary arteries: PA peak pressure: 42 mm Hg (S). - Inferior vena cava: The vessel was normal in size. The respirophasic diameter changes were in the normal range (= 50%), consistent with normal central venous pressure.  Impressions:  - Compared to a prior study in 2015, the LVEF has improved to 40-45%. the bioprosthetic mitral valve appears to be functioning normally. There is a linear density in the ascending aorta - which is mildly dilated, this is likely a reverberation artifact, but consider TEE or CT to definitively r/o dissection if clinically indicated.   LHC 12/09 LAD distal 30 R Renal Art 30-40   Assessment/Plan:  1. Chronic systolic CHF - with NICM. He had no significant [facility] at [facility] prior to his MVR. Volume continues to appear to be stable. Continue beta-blocker, angiotensin receptor blocker, Lasix. Recent labs noted.   2. S/p MVR - Overall stable by exam. No symptoms noted. Continues SBE prophylaxis.   3. Permanent AF - s/p AV node ablation and BiV-ICD. Not a candidate for anticoagulation due to prior hx of intracranial hemorrhage on Coumadin.   4. S/p BiV-ICD - Continue FU with EP as planned. Sees Dr. Lovena Le next August.   5. HLD - on statin. No recent lipids noted - he is seeing PCP next week - will be fasting for that visit.   6. COPD/pulmonary HTN - On continuous O2. Continue FU with Pulmonology as planned. His breathing seems stable.   7. 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 Roxy Manns who has just seen earlier this month - plan to rescan in 6 months.   8. Cirrhosis - followed by GI. Not discussed today.   Current medicines are reviewed with the patient today.  The patient does not have concerns regarding medicines other than what has been noted  above.  The following changes have been made:  See above.  Labs/ tests ordered today include:   No orders of the defined types were placed in this encounter.    Disposition:   He is holding his own and looks really good. Encouraged him to keep up with his walking and activity level. FU with me or Dr. Marlou Porch in 6 months.   Patient is agreeable to this plan and will call if any problems develop in the interim.   SignedTruitt Merle, NP  04/19/2017 11:04 AM  [facility] [address] [place], [place]  [address] Phone: [phone] Fax: [phone]

## 2017-04-21 ENCOUNTER — Other Ambulatory Visit (INDEPENDENT_AMBULATORY_CARE_PROVIDER_SITE_OTHER): Payer: Medicare Other

## 2017-04-21 DIAGNOSIS — E875 Hyperkalemia: Secondary | ICD-10-CM | POA: Diagnosis not present

## 2017-04-21 LAB — BASIC METABOLIC PANEL
BUN: 21 mg/dL (ref 6–23)
CALCIUM: 9.2 mg/dL (ref 8.4–10.5)
CO2: 30 meq/L (ref 19–32)
Chloride: 97 mEq/L (ref 96–112)
Creatinine, Ser: 1.07 mg/dL (ref 0.40–1.50)
GFR: 72.64 mL/min (ref >60.00)
GLUCOSE: 190 mg/dL — AB (ref 70–99)
POTASSIUM: 4.9 meq/L (ref 3.5–5.1)
SODIUM: 139 meq/L (ref 135–145)

## 2017-04-24 IMAGING — CR DG CHEST 2V
2 series · 2 of 2 positions shown · non-contrast
Comparison: 06/22/2015

CLINICAL DATA: Weakness and dizziness

EXAM:
CHEST  2 VIEW

[w chest pa]
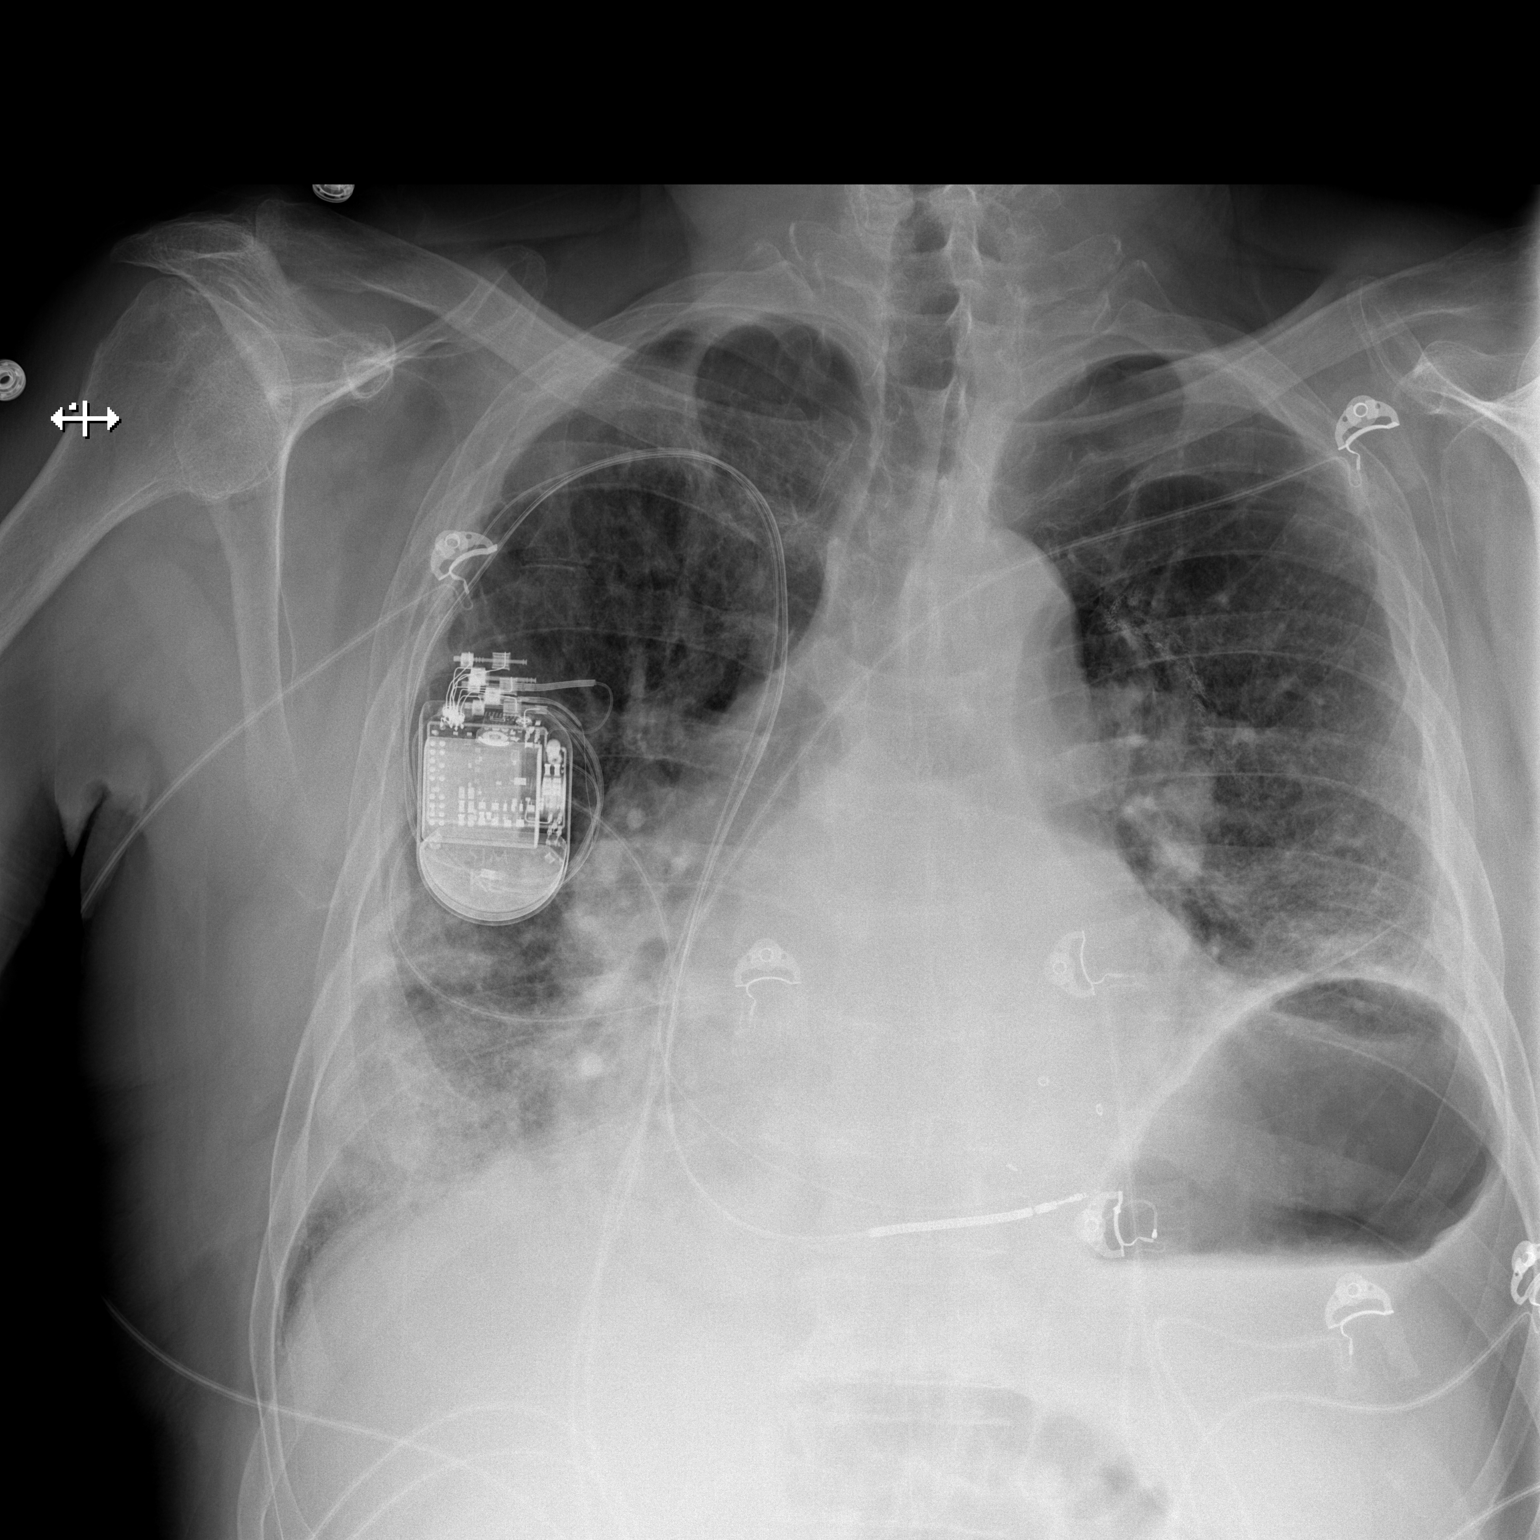

[w chest lat]
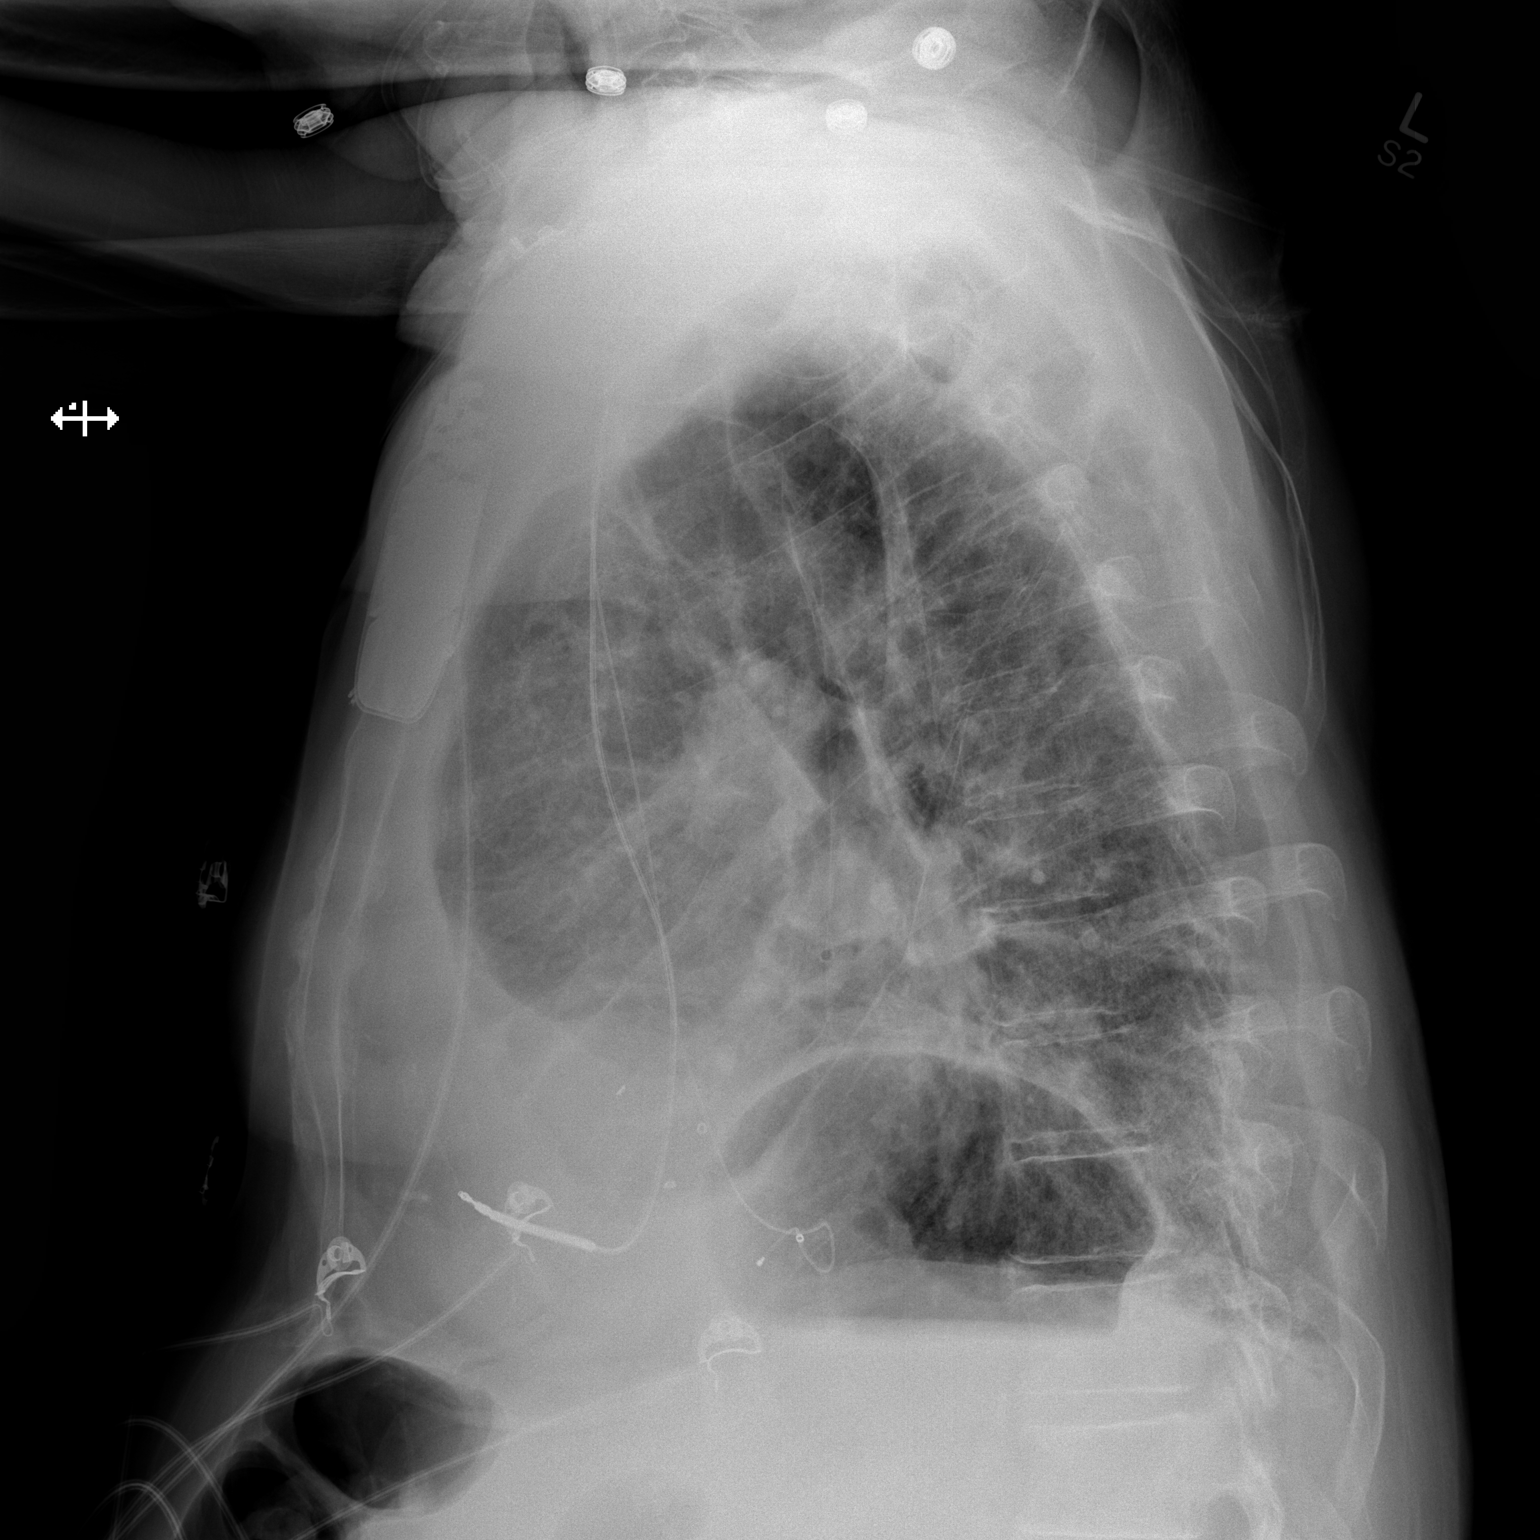

[2 of 2 positions shown; findings below may reference images not displayed]

FINDINGS: Cardiac shadow is within normal limits. A defibrillator is again
noted. The lungs show patchy bibasilar infiltrates. No sizable
effusion is seen. Elevation of left hemidiaphragm is noted.
IMPRESSION: Patchy bibasilar infiltrates.

## 2017-04-25 IMAGING — DX DG CHEST 2V
4 series · 4 of 4 positions shown · non-contrast
Comparison: Chest x-ray 09/24/2015.

CLINICAL DATA: 69-year-old male with history of pneumonia and
shortness of breath.

EXAM:
CHEST  2 VIEW

[chest lat (1 of 2)]
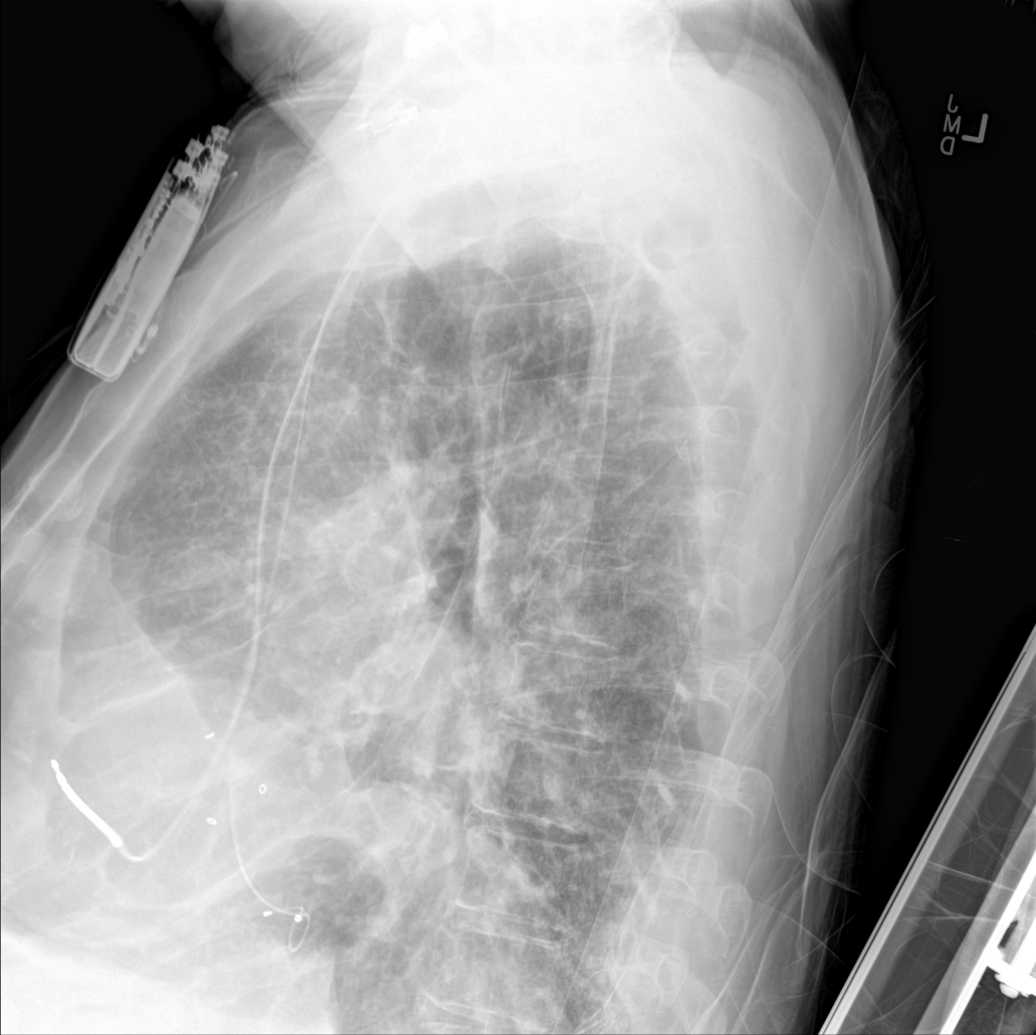

[chest lat (2 of 2)]
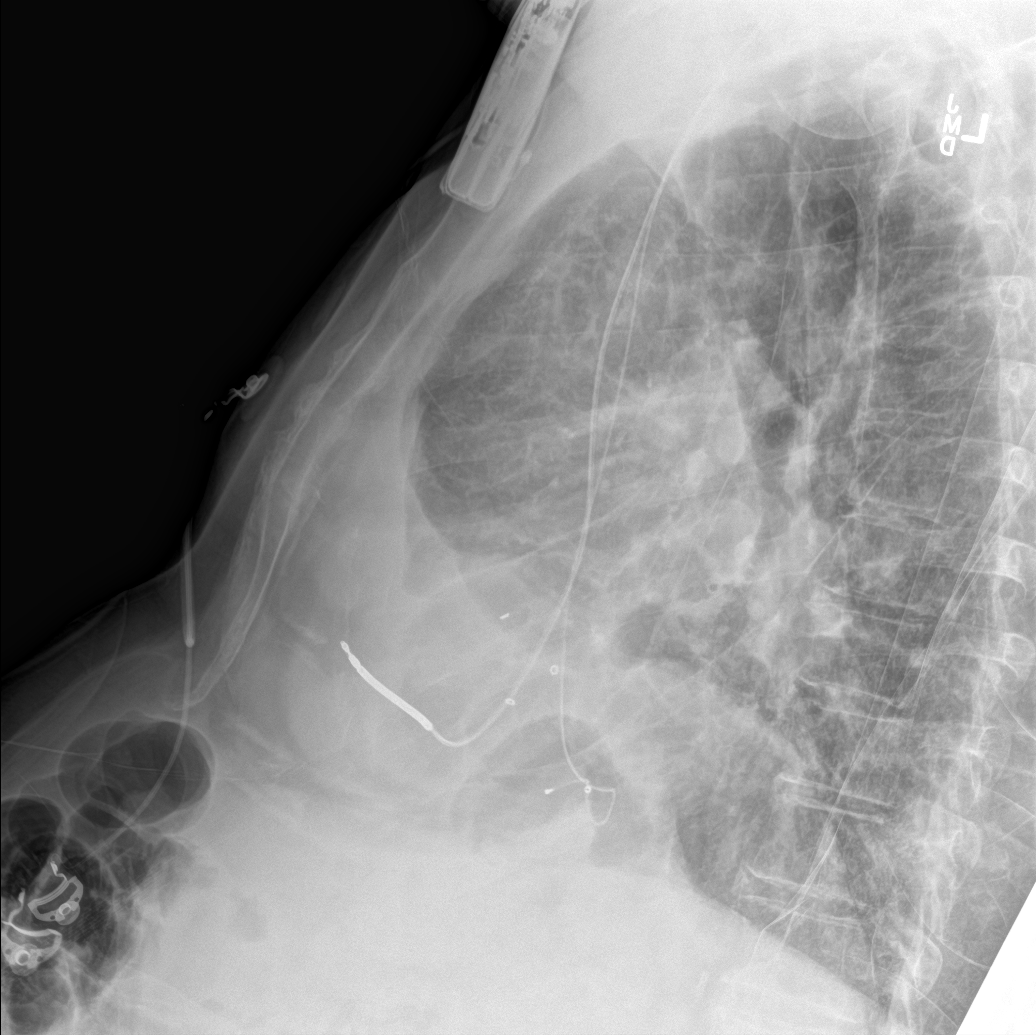

[chest ap (1 of 2)]
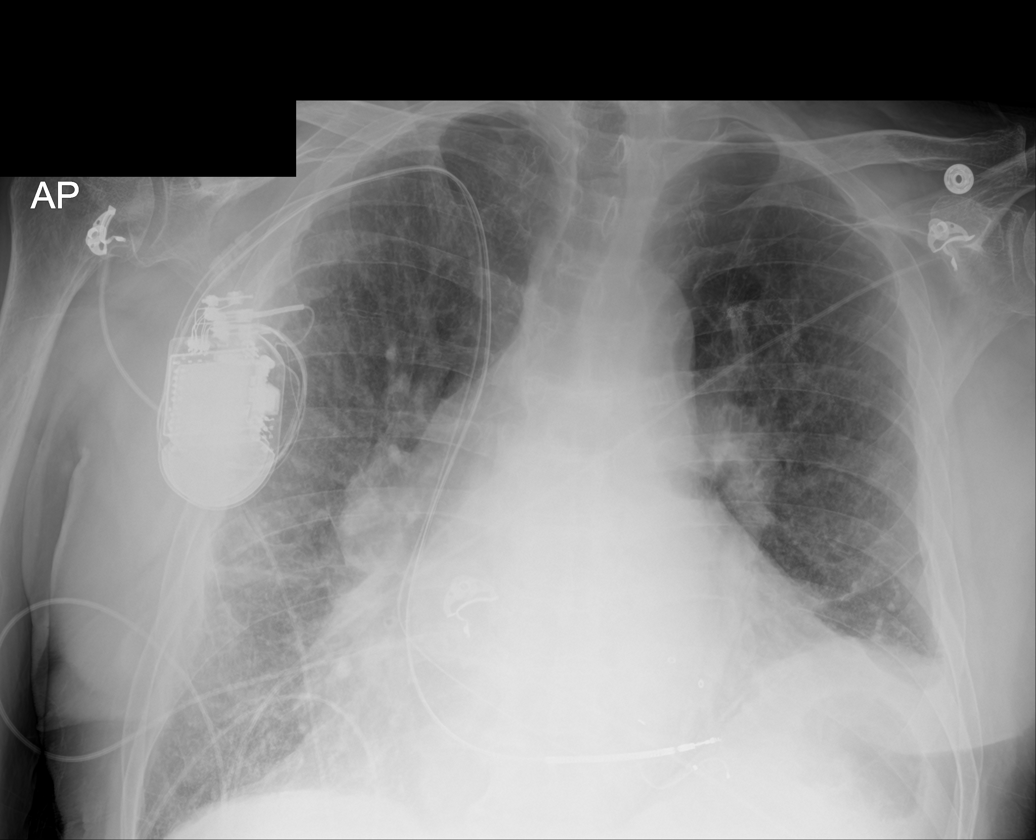

[chest ap (2 of 2)]
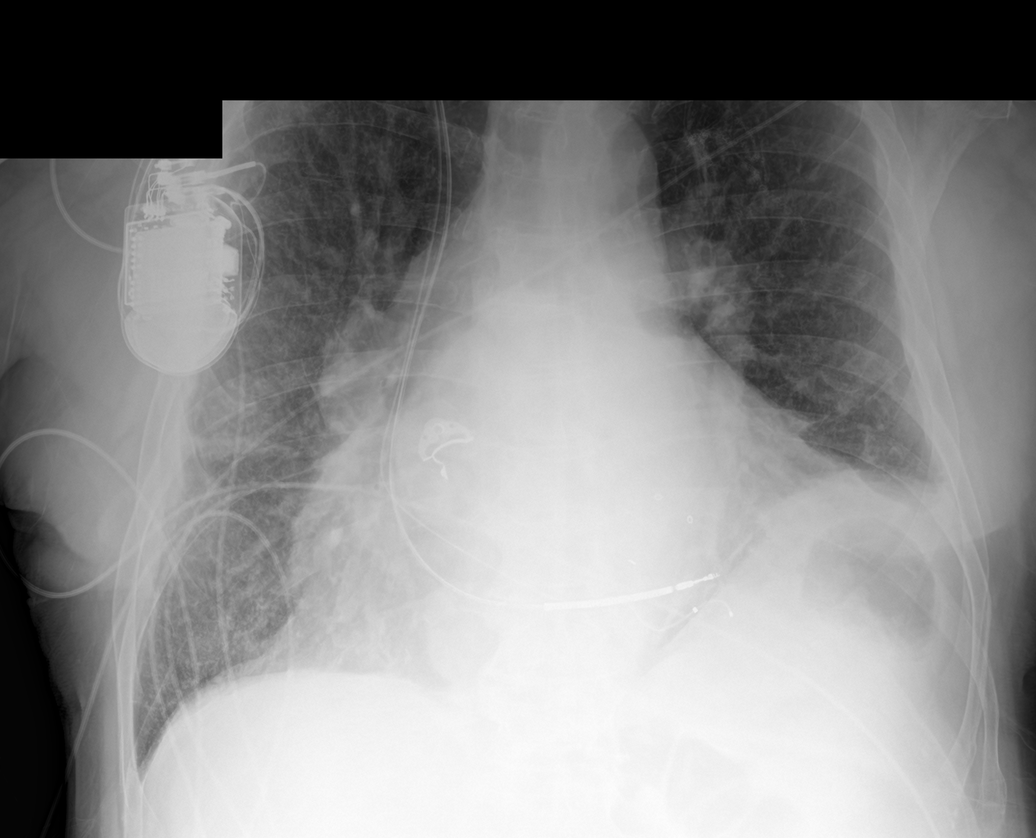

[4 of 4 positions shown; findings below may reference images not displayed]

FINDINGS: Elevation of left hemidiaphragm is unchanged. Diffuse peribronchial
cuffing and interstitial prominence, similar to the prior
examination. The ill-defined opacities in the lung bases bilaterally
favored to predominantly reflect subsegmental atelectasis, although
areas of airspace consolidation are not excluded. Pulmonary vascular
engorgement. Mild cardiomegaly. Upper mediastinal contours are
within normal limits allowing for patient positioning. Right-sided
biventricular pacemaker/AICD with lead tips projecting over the
expected location of the right ventricular apex and overlying the
lateral wall the left ventricle via the coronary sinus and coronary
veins. Marker in place suggestive of prior mitral valve replacement.
IMPRESSION: 1. Findings suggest mild congestive heart failure, as above.
2. Additional bibasilar opacities are predominantly favored to
reflect subsegmental atelectasis, although underlying airspace
consolidation from infection or aspiration in the lung bases is not
entirely excluded.
3. Elevation of left hemidiaphragm.

## 2017-04-26 ENCOUNTER — Ambulatory Visit (INDEPENDENT_AMBULATORY_CARE_PROVIDER_SITE_OTHER): Payer: Medicare Other | Admitting: Adult Health

## 2017-04-26 ENCOUNTER — Encounter: Payer: Self-pay | Admitting: Adult Health

## 2017-04-26 ENCOUNTER — Other Ambulatory Visit: Payer: Self-pay | Admitting: Adult Health

## 2017-04-26 VITALS — BP 110/50 | Temp 97.5°F | Wt 201.0 lb

## 2017-04-26 DIAGNOSIS — E785 Hyperlipidemia, unspecified: Secondary | ICD-10-CM | POA: Diagnosis not present

## 2017-04-26 DIAGNOSIS — Z23 Encounter for immunization: Secondary | ICD-10-CM | POA: Diagnosis not present

## 2017-04-26 DIAGNOSIS — E119 Type 2 diabetes mellitus without complications: Secondary | ICD-10-CM

## 2017-04-26 NOTE — Progress Notes (Signed)
Subjective:    Patient ID: [name], male    DOB: 10-29-1945, 71 y.o.   MRN: [id number]  HPI  71 year old male who  has a past medical history of Atrial fibrillation ([place]); Atrial septal defect; Automatic implantable cardioverter-defibrillator in situ; Cardiomyopathy; CHF (congestive heart failure) ([place]); Chronic combined systolic and diastolic CHF (congestive heart failure) ([place]); Colon polyps; COPD (chronic obstructive pulmonary disease) ([place]); COPD GOLD II (01/11/2007); CVA (cerebral vascular accident) ([place]) (2009); Diabetes mellitus without complication ([place]); Dyslipidemia; Dysrhythmia; Ejection fraction < 50%; Endocarditis; Headache(784.0); HLD (hyperlipidemia); Hypertension; Intracranial hemorrhage ([place]); Lung cancer ([place]) (11/29/2013); Mitral regurgitation; Myocardial infarction ([place]) (2010); Pacemaker; Permanent atrial fibrillation; Prosthetic valve dysfunction; Pulmonary hypertension ([place]); Renal artery stenosis ([place]); 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  He presents to the office today for six month follow up regarding diabetes and hyperlipidemia. He was last seen on 10/2016 and his A1c was 7.7. He is currently managed with Metformin 1000 mg BID. He does not check his blood sugars at home frequently but when he does " they are about normal".   Hyperlipidemia is controlled with Lipitor 10 mg   Lab Results  Component Value Date   CHOL 107 10/19/2015   HDL 35.40 (L) 10/19/2015   LDLCALC 39 10/19/2015   LDLDIRECT 80.2 03/31/2011   TRIG 164.0 (H) 10/19/2015   CHOLHDL 3 10/19/2015     Lab Results  Component Value Date   HGBA1C 7.7 10/25/2016   He was seen by Cardiology earlier this month and per their report his exam was overall stable. He is continued on beta blocker, angiotensin receptor blocker, and lasix. He is to follow  up with them in 6 months   He has no complaints today and states" I feel good".   Review of Systems See HPI   Past Medical History:  Diagnosis Date  . Atrial fibrillation ([place])    AV Node ablation January, 2010, for rapid atrial fib  . Atrial septal defect    Closed with surgery January, 2010  . Automatic implantable cardioverter-defibrillator in situ    LV dysfunction and pacer needed for AV node lesion  . Cardiomyopathy    non-ischemic  . CHF (congestive heart failure) ([place])   . Chronic combined systolic and diastolic CHF (congestive heart failure) ([place])   . Colon polyps   . COPD (chronic obstructive pulmonary disease) (HCC)    O2- 2 liters, nasal cannula, q night   . COPD GOLD II 01/11/2007   PFT's 09/25/13  FEV1  2.18 (61%) ratio 64 no change p B2 and DLC0  32% corrects to 68% - trial off advair and acei rec starting  08/23/2013     . CVA (cerebral vascular accident) ([place]) 2009   denies residual on 08/14/2013  . Diabetes mellitus without complication ([place])    type 2  . Dyslipidemia   . Dysrhythmia   . Ejection fraction < 50%   . Endocarditis    Bacterial, 2009  . Headache(784.0)    related to stroke only  . HLD (hyperlipidemia)   . Hypertension   . Intracranial hemorrhage (HCC)    Coumadin cannot be used because of the history of his bleed  . Lung cancer ([place]) 11/29/2013   T1N0 Stage Ia non-small cell carcinoma left lung treated with wedge resection  . Mitral regurgitation    Severe symptomatic primary MR due to bacterial endocarditis, treated  w/ MVR // Echo 1/18 EF 40-45, diffuse HK, dilated aorta at 42 mm/aortic root 47 mm; linear echogenic structure in ascending aorta - suspect reverberation artifact / consider CT or TEE to rule out dissection flap, bioprosthetic MVR with mean gradient 3, severe LAE, low normal RVSF, severe RAE, moderate TR, PASP 42  . Myocardial infarction ([place]) 2010  . Pacemaker    combo pacer and icd  . Permanent atrial fibrillation    Originally  Coumadin use for atrial fibrillation  //   he had intracerebral hemorrhage with an INR of 2.3 June, 2009. Anticoagulation could no longer be used.  //  Rapid atrial fibrillation after inferior MI October, 2010..........Marland Kitchen AV node ablation done at that time with ICD pacemaker placed (EF 35%).   //   Left atrial appendage tied off at the time of mitral valve surgery January, 2010 (maze pro  . Prosthetic valve dysfunction    Mild mitral stenosis  . Pulmonary hypertension (HCC)    Moderate  . Renal artery stenosis (HCC)    Mild by history  . 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 Spontaneous pneumothorax    right thoracotomy - distant past  . Status post minimally invasive mitral valve replacement with bioprosthetic valve    33 mm Medtronic Mosaic porcine bioprosthesis placed via right mini thoracotomy for bacterial endocarditis complicated by severe MR and CHF   . Thoracic aortic aneurysm ([place]) 08/11/2016   a - Chest CTA 1/18:  Aneurysmal dilatation of aortic root is noted at 5.1 cm.     Social History   Social History  . Marital status: Married    Spouse name: [name]  . Number of children: 0  . Years of education: College   Occupational History  . Retired Retired    Tour manager   Social History Main Topics  . Smoking status: Former Smoker    Packs/day: 1.00    Years: 45.00    Types: Cigarettes    Quit date: 08/11/2013  . Smokeless tobacco: Never Used     Comment: 08/14/2013 "quit smoking in 2009"  . Alcohol use No     Comment: 08/14/2013 "used to drink beer; quit:in 1982"  . Drug use: No  . Sexual activity: Yes   Other Topics Concern  . Not on file   Social History Narrative   Patient lives at home with his spouse.   Caffeine Use: none   Worked for the post [employer]   Has two boys and a girl. All live local.     Past Surgical History:  Procedure Laterality Date  . APPENDECTOMY    . ASD REPAIR, SECUNDUM  07/17/2008   pericardial patch closure of ASD  . AV NODE ABLATION   07/2008   for rapid atrial fib  . CARDIAC CATHETERIZATION    . CARDIAC DEFIBRILLATOR PLACEMENT  ~ [address] [place]  . CARDIAC VALVE REPLACEMENT    . CATARACT EXTRACTION W/ INTRAOCULAR LENS  IMPLANT, BILATERAL Bilateral   . ESOPHAGOGASTRODUODENOSCOPY (EGD) WITH PROPOFOL N/A 09/29/2016   Procedure: ESOPHAGOGASTRODUODENOSCOPY (EGD) WITH PROPOFOL;  Surgeon: [name], MD;  Location: [facility] ENDOSCOPY;  Service: Endoscopy;  Laterality: N/A;  . HERNIA REPAIR    . IMPLANTABLE CARDIOVERTER DEFIBRILLATOR (ICD) GENERATOR CHANGE N/A 02/06/2012   Procedure: ICD GENERATOR CHANGE;  Surgeon: [name], MD;  Location[facility] C[facility] LAB;  Service: Cardiovascular;  Laterality: N/A;  . INSERT / REPLACE / REMOVE PACEMAKER    . MASS BIOPSY Left  neck mass  . MITRAL VALVE REPLACEMENT Right 07/17/2008   53mm Medtronic Mosaic porcine bioprosthesis  . PENILE PROSTHESIS IMPLANT    . RIGHT HEART CATHETERIZATION N/A 08/16/2013   Procedure: RIGHT HEART CATH;  Surgeon: Josue Hector, MD;  Location[facility] CATH LAB;  Service: Cardiovascular;  Laterality: N/A;  . TEE WITHOUT CARDIOVERSION N/A 09/06/2016   Procedure: TRANSESOPHAGEAL ECHOCARDIOGRAM (TEE);  Surgeon: Dorothy Spark, MD;  Location: [facility] ENDOSCOPY;  Service: Cardiovascular;  Laterality: N/A;  . THORACOTOMY Right 1970's   spontaneous pneumothorax - while in the [place]  . TOE SURGERY     left foot hammer toe  . TONSILLECTOMY    . VIDEO ASSISTED THORACOSCOPY (VATS)/WEDGE RESECTION Left 11/29/2013   Procedure: Video assisted thoracoscopy for wedge resection; mini thoracotomy;  Surgeon: [name], MD;  Location: [facility] OR;  Service: Thoracic;  Laterality: Left;    Family History  Problem Relation Age of Onset  . Stomach cancer Father   . Stroke Mother     Allergies  Allergen Reactions  . Anticoagulant Compound Other (See Comments)    Pt had intracranial bleed, therefore all anticoagulation is contra-indicated per Dr. Ron Parker.  . Warfarin Sodium Other (See  Comments)    Pt had intracranial bleed, therefore all anticoagulation is contra-indicated per Dr. Ron Parker.     Current Outpatient Prescriptions on File Prior to Visit  Medication Sig Dispense Refill  . acetaminophen (TYLENOL) 500 MG tablet Take 500-1,000 mg by mouth every 6 (six) hours as needed for moderate pain or headache.    Marland Kitchen aspirin EC 81 MG tablet Take 1 tablet (81 mg total) by mouth daily. 90 tablet 3  . atorvastatin (LIPITOR) 10 MG tablet Take 10 mg by mouth at bedtime.    . budesonide-formoterol (SYMBICORT) 160-4.5 MCG/ACT inhaler INHALE 2 PUFFS INTO THE LUNGS TWICE A DAY 30.6 Inhaler 2  . carvedilol (COREG) 3.125 MG tablet TAKE 1 TABLET BY MOUTH TWICE A DAY WITH MEALS 180 tablet 1  . furosemide (LASIX) 40 MG tablet Take 40 mg by mouth daily. May take 1 extra daily as needed for edema    . irbesartan (AVAPRO) 75 MG tablet TAKE 1 TABLET BY MOUTH EVERY DAY 90 tablet 3  . nitroGLYCERIN (NITROSTAT) 0.4 MG SL tablet Place 0.4 mg under the tongue every 5 (five) minutes as needed for chest pain (x 3 doses). Reported on 12/29/2015    . OXYGEN Inhale 2 L/min into the lungs continuous.    Marland Kitchen amoxicillin (AMOXIL) 500 MG capsule Take 2,000 mg by mouth as directed. 1 hour prior to dental procedures    . metFORMIN (GLUCOPHAGE) 1000 MG tablet Take 1 tablet (1,000 mg total) by mouth 2 (two) times daily with a meal. 180 tablet 3   No current facility-administered medications on file prior to visit.     BP (!) 110/50   Temp (!) 97.5 F (36.4 C) (Oral)   Wt 201 lb (91.2 kg)   BMI 27.26 kg/m       Objective:   Physical Exam  Constitutional: He is oriented to person, place, and time. He appears well-developed and well-nourished. No distress.  Cardiovascular: Normal rate, regular rhythm, normal heart sounds and intact distal pulses.  Exam reveals no gallop.   No murmur heard. Pulmonary/Chest: Effort normal and breath sounds normal. No respiratory distress. He has no wheezes. He has no rales.    Oxygen via [place]   Neurological: He is alert and oriented to person, place, and time.  Skin: Skin  is warm and dry. No rash noted. No erythema. No pallor.  Psychiatric: He has a normal mood and affect. His behavior is normal. Judgment and thought content normal.  Nursing note and vitals reviewed.     Assessment & Plan:  1. Type 2 diabetes mellitus without complication, without long-term current use of insulin (HCC) - He will come back tomorrow as he is not fasting  - Hemoglobin A1c; Future - Lipid panel; Future - Basic metabolic panel; Future  2. Dyslipidemia - consider dose change  - Lipid panel; Future  3. Need for influenza vaccination  - Flu vaccine HIGH DOSE PF (Fluzone High dose)  [name], NP

## 2017-04-26 NOTE — Patient Instructions (Signed)
It was great seeing you today   Please follow up tomorrow for fasting labs. You can come in at 8 am

## 2017-04-27 ENCOUNTER — Other Ambulatory Visit: Payer: Medicare Other

## 2017-04-27 DIAGNOSIS — E119 Type 2 diabetes mellitus without complications: Secondary | ICD-10-CM

## 2017-04-27 LAB — LIPID PANEL
CHOLESTEROL: 93 mg/dL (ref 0–200)
HDL: 43.5 mg/dL (ref >39.00)
LDL Cholesterol: 25 mg/dL (ref 0–99)
NonHDL: 49.03
TRIGLYCERIDES: 119 mg/dL (ref 0.0–149.0)
Total CHOL/HDL Ratio: 2
VLDL: 23.8 mg/dL (ref 0.0–40.0)

## 2017-04-27 LAB — HEMOGLOBIN A1C: HEMOGLOBIN A1C: 7.3 % — AB (ref 4.6–6.5)

## 2017-04-27 LAB — BASIC METABOLIC PANEL
BUN: 20 mg/dL (ref 6–23)
CALCIUM: 9.5 mg/dL (ref 8.4–10.5)
CO2: 30 mEq/L (ref 19–32)
Chloride: 99 mEq/L (ref 96–112)
Creatinine, Ser: 0.98 mg/dL (ref 0.40–1.50)
GFR: 80.39 mL/min (ref >60.00)
Glucose, Bld: 179 mg/dL — ABNORMAL HIGH (ref 70–99)
POTASSIUM: 4.4 meq/L (ref 3.5–5.1)
SODIUM: 138 meq/L (ref 135–145)

## 2017-04-27 NOTE — Addendum Note (Signed)
Addended by: Denna Haggard K on: 04/27/2017 08:00 AM   Modules accepted: Orders

## 2017-04-27 NOTE — Telephone Encounter (Signed)
Sent to the pharmacy by e-scribe for 1 year per [facility].

## 2017-04-27 NOTE — Telephone Encounter (Signed)
Ok to refill for one year  

## 2017-05-03 ENCOUNTER — Other Ambulatory Visit: Payer: Self-pay | Admitting: Adult Health

## 2017-05-03 NOTE — Telephone Encounter (Signed)
Sent to the pharmacy by e-scribe. 

## 2017-05-26 IMAGING — US US PELVIS LIMITED
1 series · 10 of 10 positions shown · non-contrast
Comparison: None.

CLINICAL DATA: Right groin pain.

EXAM:
LIMITED ULTRASOUND OF PELVIS
TECHNIQUE: Limited transabdominal ultrasound examination of the pelvis was
performed.

[Series 1: us pelvis limited · 0.12mm/px · 10 acquisitions, 10 frames shown]
[im 1/10]
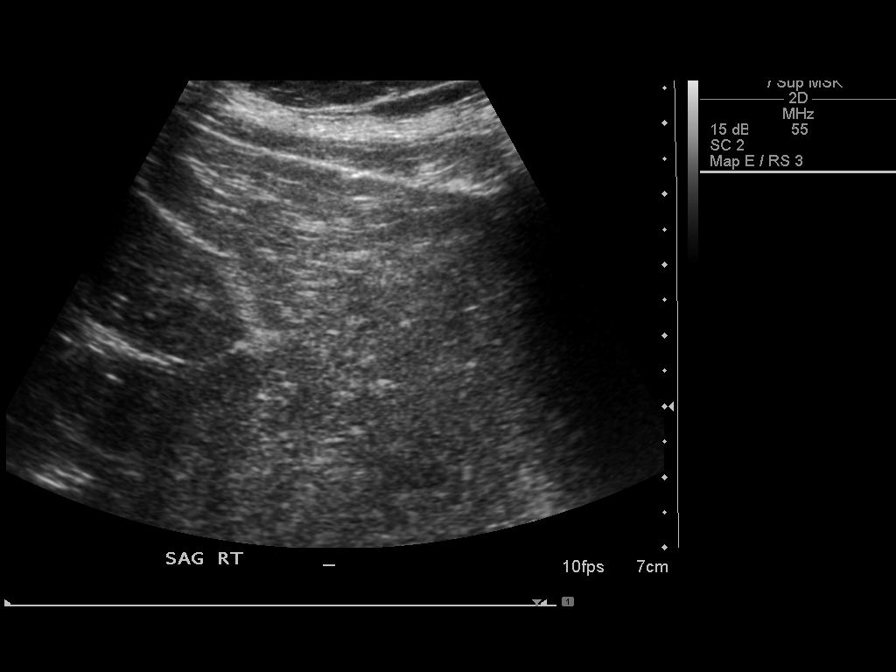
[im 2/10]
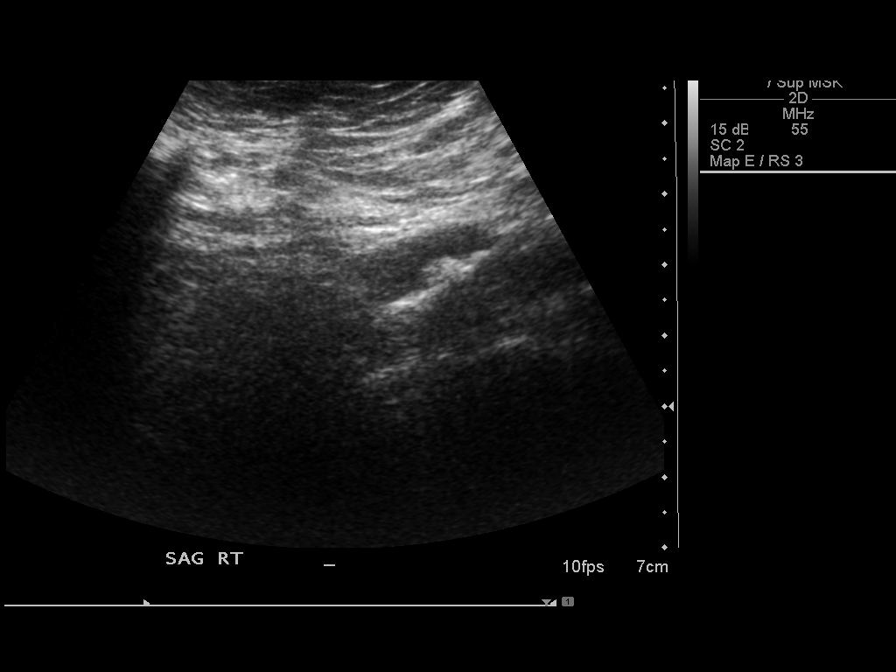
[im 3/10]
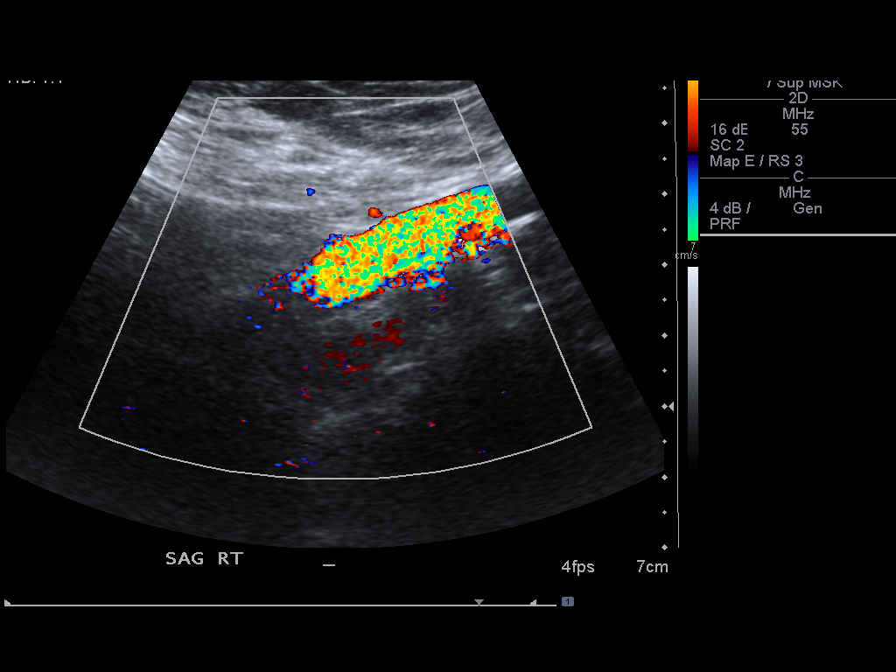
[im 4/10]
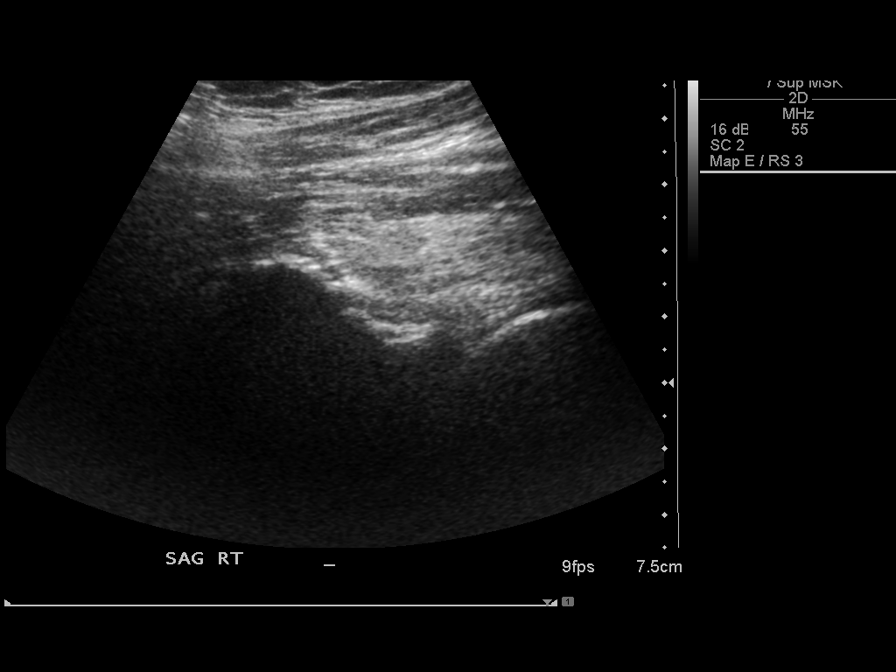
[im 5/10]
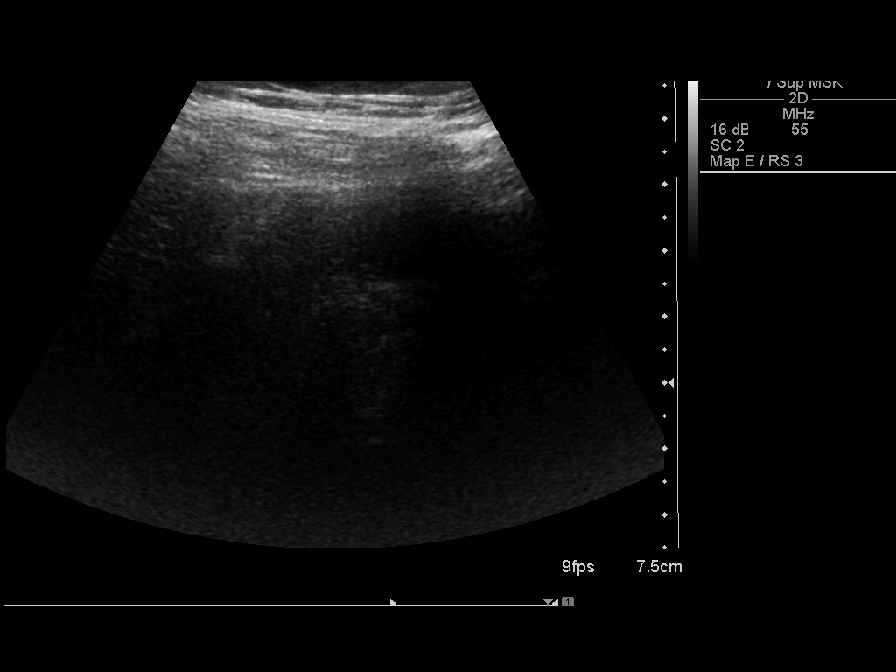
[im 6/10]
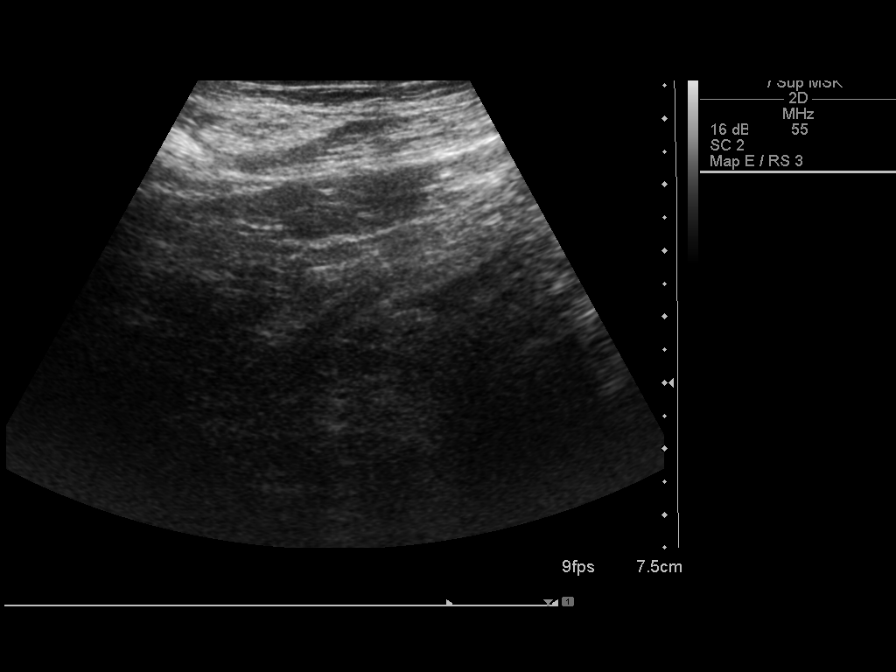
[im 7/10]
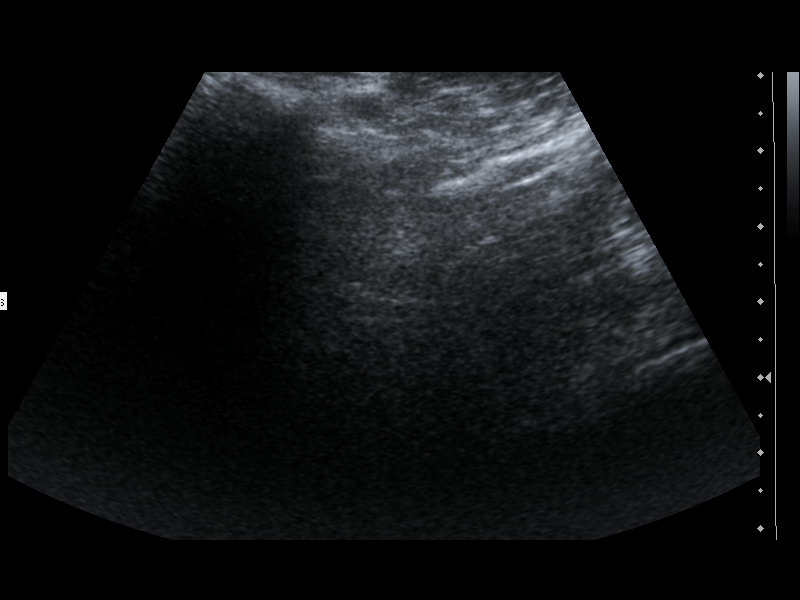
[im 8/10]
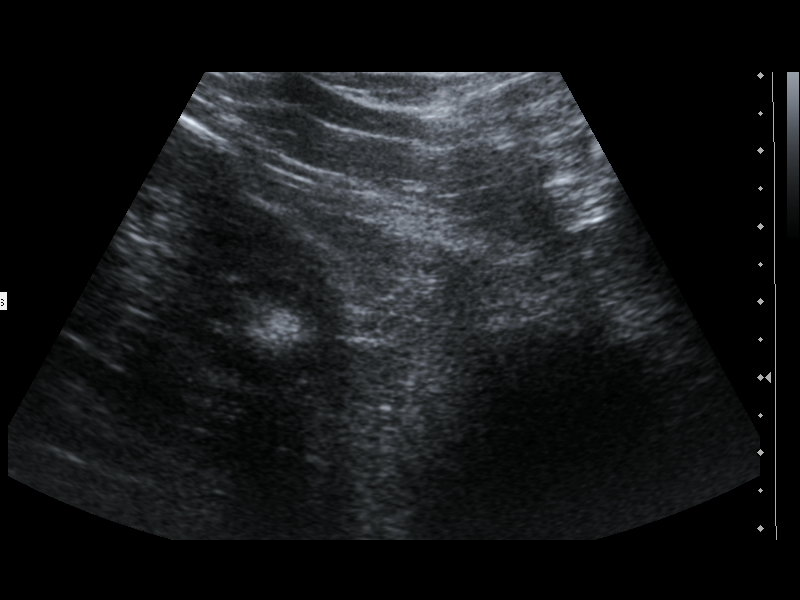
[im 9/10]
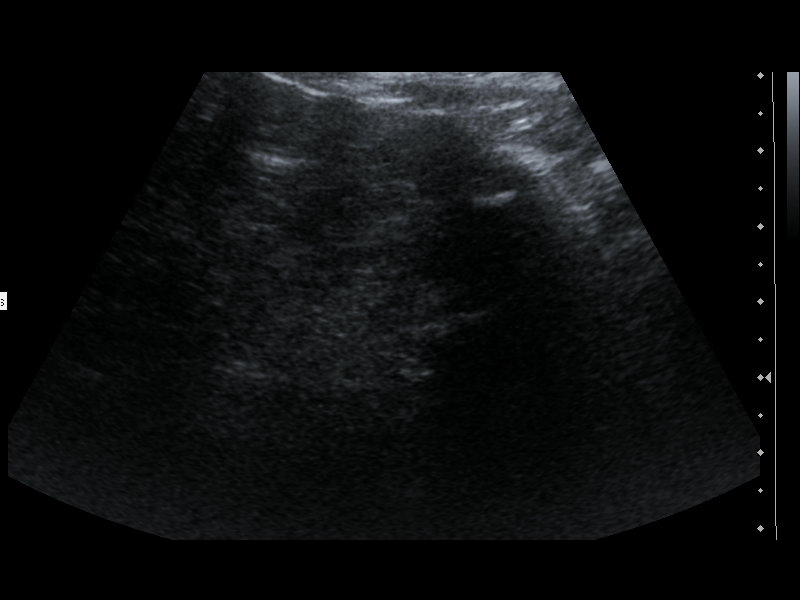
[im 10/10]
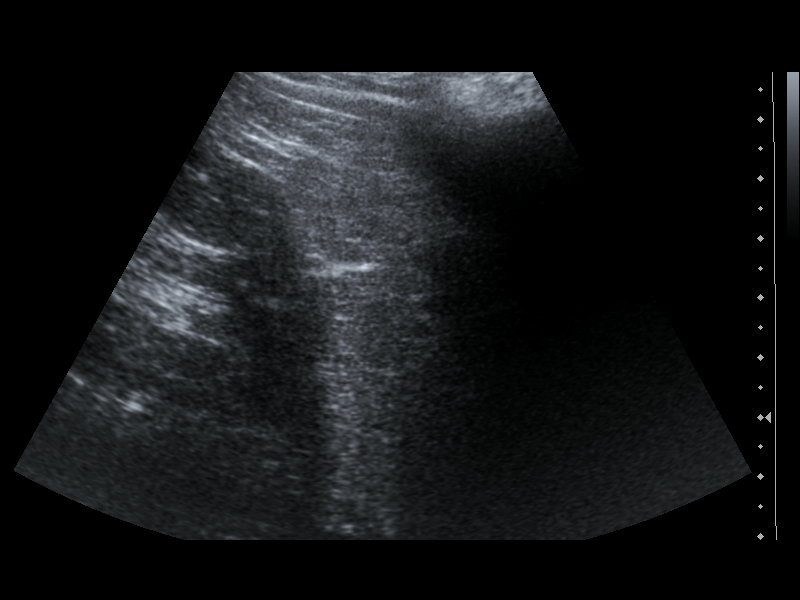

[10 of 10 positions shown; findings below may reference images not displayed]

FINDINGS: Limited sonographic evaluation was performed of the right inguinal
region. No mass or fluid collection is noted. No hernia is noted.
IMPRESSION: No sonographic abnormality seen in the right inguinal region.

## 2017-05-31 ENCOUNTER — Ambulatory Visit (INDEPENDENT_AMBULATORY_CARE_PROVIDER_SITE_OTHER): Payer: Medicare Other | Admitting: *Deleted

## 2017-05-31 DIAGNOSIS — I428 Other cardiomyopathies: Secondary | ICD-10-CM

## 2017-05-31 NOTE — Progress Notes (Signed)
Remote ICD transmission.   

## 2017-06-09 ENCOUNTER — Encounter: Payer: Self-pay | Admitting: Cardiology

## 2017-06-20 LAB — CUP PACEART REMOTE DEVICE CHECK
Date Time Interrogation Session: 20181121090015
HIGH POWER IMPEDANCE MEASURED VALUE: 72 Ohm
HighPow Impedance: 72 Ohm
Implantable Lead Implant Date: 20100113
Implantable Lead Location: 753860
Implantable Lead Model: 7122
Implantable Pulse Generator Implant Date: 20130729
Lead Channel Impedance Value: 1025 Ohm
Lead Channel Pacing Threshold Amplitude: 1.375 V
Lead Channel Pacing Threshold Pulse Width: 0.5 ms
Lead Channel Setting Pacing Amplitude: 2.375
MDC IDC LEAD IMPLANT DT: 20100113
MDC IDC LEAD LOCATION: 753858
MDC IDC MSMT BATTERY REMAINING LONGEVITY: 38 mo
MDC IDC MSMT BATTERY REMAINING PERCENTAGE: 42 %
MDC IDC MSMT BATTERY VOLTAGE: 2.92 V
MDC IDC MSMT LEADCHNL LV PACING THRESHOLD PULSEWIDTH: 0.5 ms
MDC IDC MSMT LEADCHNL RV IMPEDANCE VALUE: 390 Ohm
MDC IDC MSMT LEADCHNL RV PACING THRESHOLD AMPLITUDE: 0.625 V
MDC IDC MSMT LEADCHNL RV SENSING INTR AMPL: 12 mV
MDC IDC PG SERIAL: 7053988
MDC IDC SET LEADCHNL LV PACING PULSEWIDTH: 0.5 ms
MDC IDC SET LEADCHNL RV PACING AMPLITUDE: 2 V
MDC IDC SET LEADCHNL RV PACING PULSEWIDTH: 0.5 ms
MDC IDC SET LEADCHNL RV SENSING SENSITIVITY: 0.5 mV

## 2017-07-25 IMAGING — CT CT CHEST W/ CM
2 of 3 series · 15 of 36 positions shown, 18 images · IV contrast (ISOVUE)
Comparison: Chest radiograph of 09/25/2015. Most recent chest CT of
06/22/2015.

CLINICAL DATA: Left-sided lung cancer diagnosed in 1346 with
surgery only. Left-sided lobectomy.

EXAM:
CT CHEST WITH CONTRAST
TECHNIQUE: Multidetector CT imaging of the chest was performed during
intravenous contrast administration.
CONTRAST:  75mL PV1V7F-3LL IOPAMIDOL (PV1V7F-3LL) INJECTION 61%

[Series 2: chest with st · axial · 0.87mm/px · z∈[-234,+74]mm · 12 of 182 slices shown, 15 images]
[im 14/182  mediastinal]
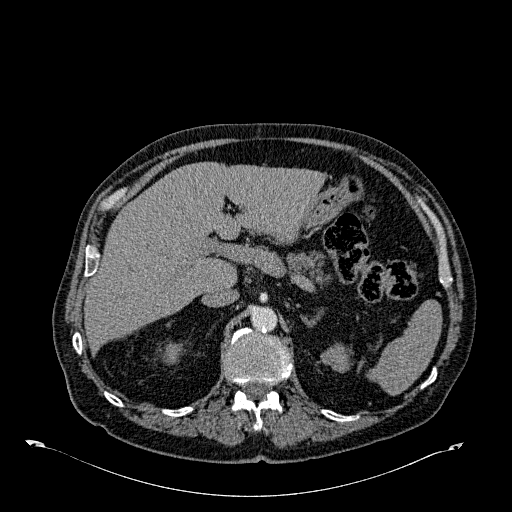
[im 14/182  lung]
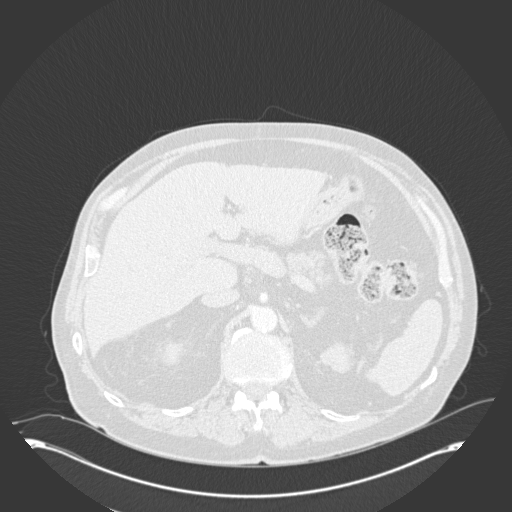
[im 27/182  lung]
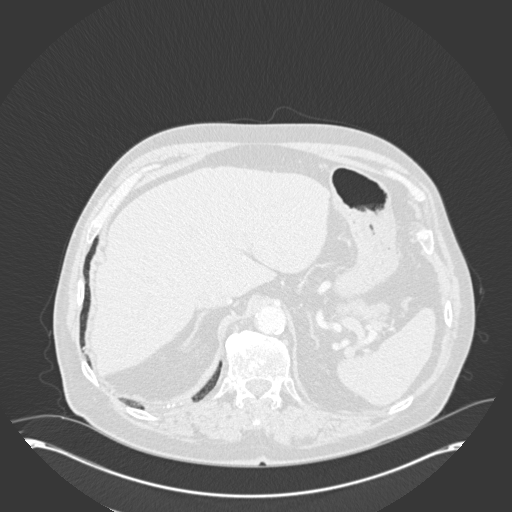
[im 41/182  lung]
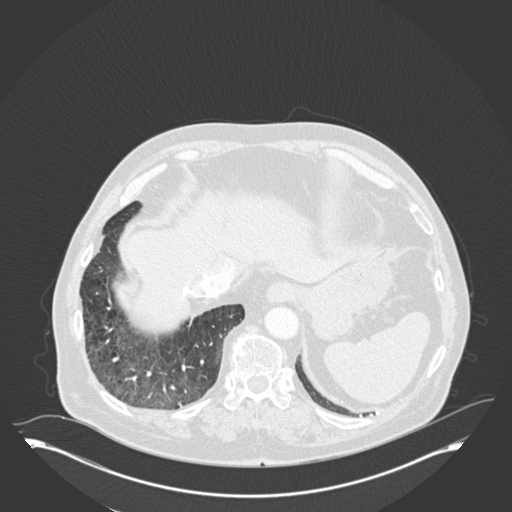
[im 54/182  lung]
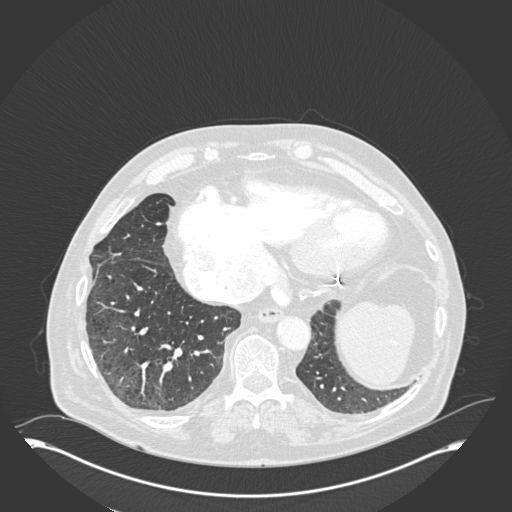
[im 68/182  mediastinal]
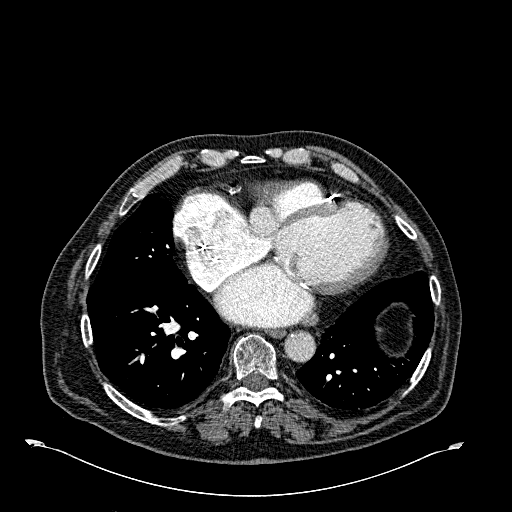
[im 68/182  lung]
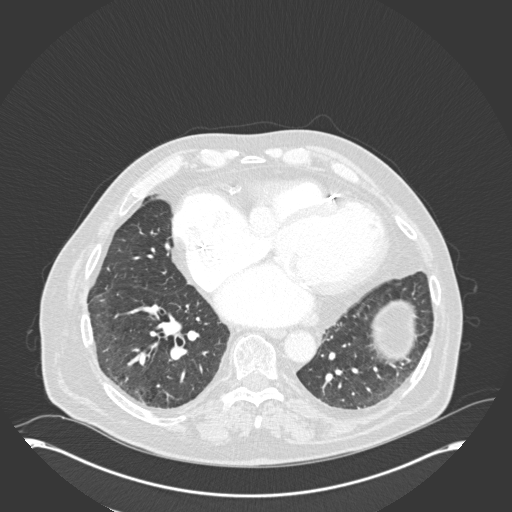
[im 81/182  lung]
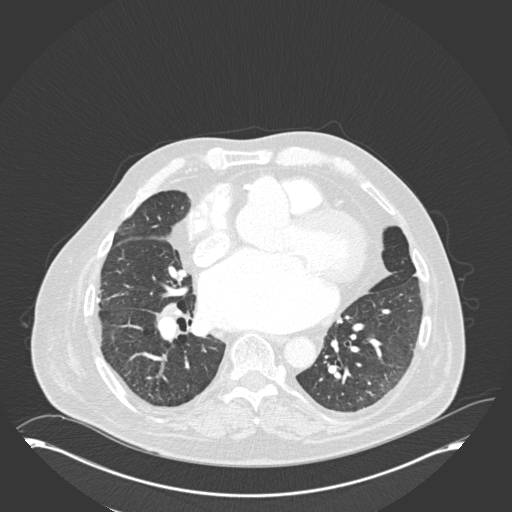
[im 101/182  lung]
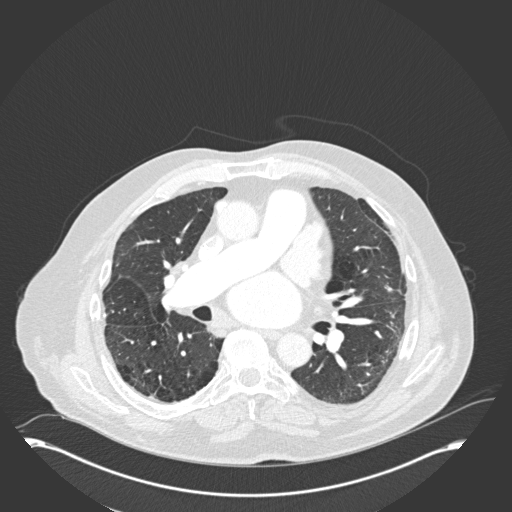
[im 114/182  lung]
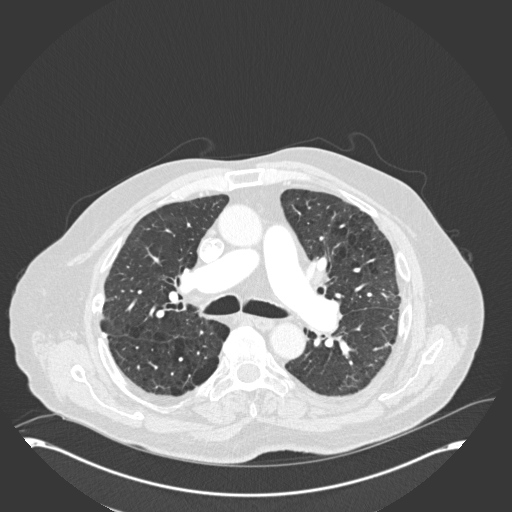
[im 128/182  mediastinal]
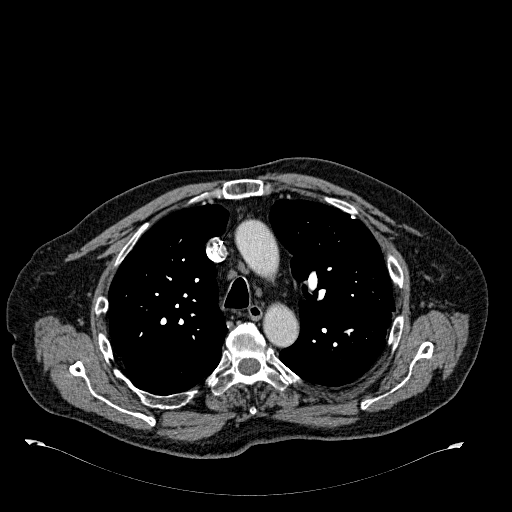
[im 128/182  lung]
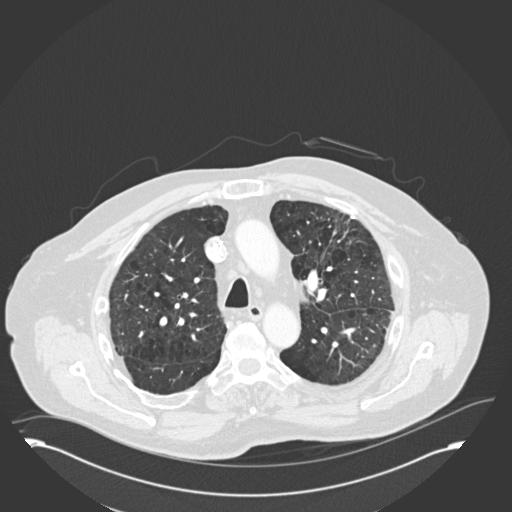
[im 141/182  lung]
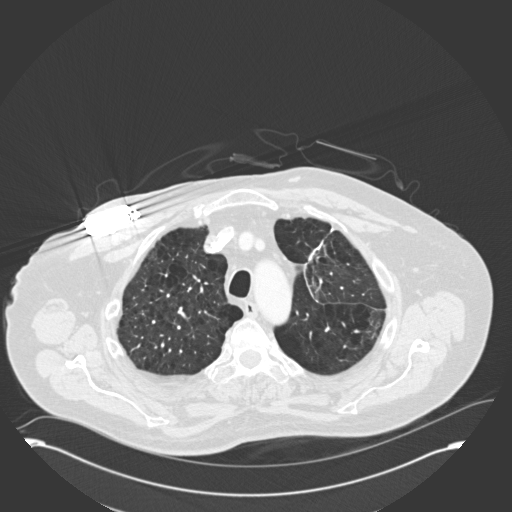
[im 155/182  lung]
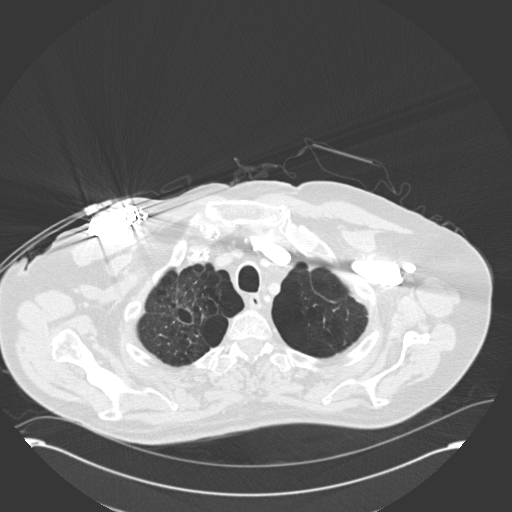
[im 168/182  lung]
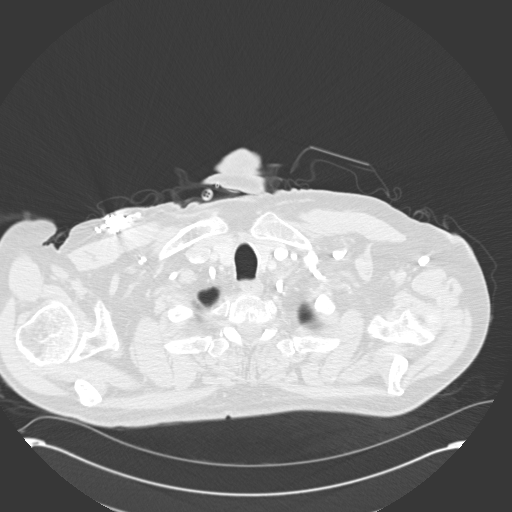

[Series 602: <mpr thick range> · coronal · 0.87mm/px · 3 of 153 slices shown]
[im 31/153  lung]
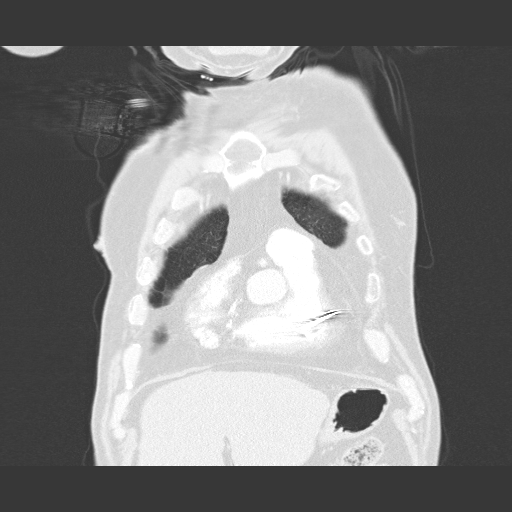
[im 61/153  lung]
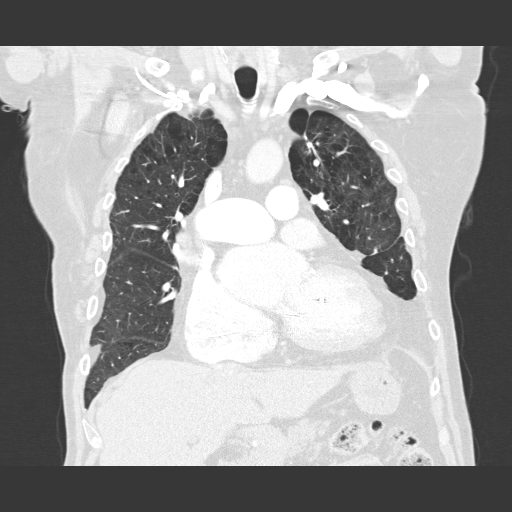
[im 92/153  lung]
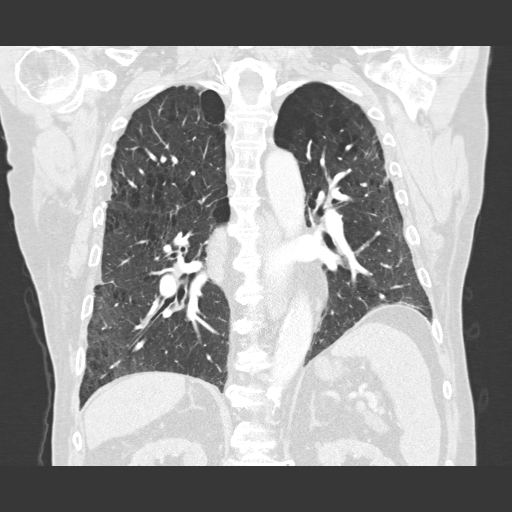

[15 of 36 positions shown; findings below may reference images not displayed]

FINDINGS: Mediastinum/Nodes: No supraclavicular adenopathy. Aortic
atherosclerosis. Pacer. Tortuous thoracic aorta. Moderate
cardiomegaly with moderate to marked left atrial enlargement.
Multivessel coronary artery atherosclerosis. Pulmonary artery
enlargement, including 3.4 cm right pulmonary artery. No central
pulmonary embolism, on this non-dedicated study. Node within the
subcarinal station with extension into the azygoesophageal recess
measures 11 mm and is similar.

There is also borderline right hilar adenopathy at 1.3 cm on image
78/series 2, similar.

Lungs/Pleura: Minimal right-sided pleural thickening. Advanced
bullous type emphysema.

Surgical sutures in the left upper lobe, without locally recurrent
disease.

2 mm left upper lobe pulmonary nodule on image 49/ series 5 is not
significantly changed.

Left base nodules likely represent calcified granulomas and are at
similar, on the order of 2 mm or less.

Upper abdomen: Normal imaged portions of the liver, spleen, stomach,
pancreas, gallbladder, biliary tract,, right kidney. Incompletely
imaged low-density left renal lesion is likely a cyst. Abdominal
aortic atherosclerosis.

Musculoskeletal: Mild left-sided gynecomastia. Mild L1 superior
endplate irregularity with sclerosis, new.
IMPRESSION: 1. Status post left upper lobe wedge resection, without recurrent or
metastatic disease.
2. A left upper lobe tiny pulmonary nodule is unchanged.
3. Borderline to mild thoracic adenopathy, similar and likely
reactive.
4. Cardiomegaly with atherosclerosis, including the coronary
arteries.
5. Pulmonary artery enlargement suggests pulmonary arterial
hypertension.
6. Left-sided gynecomastia.
7. New mild superior endplate compression deformity at L1.

## 2017-07-25 IMAGING — US US ABDOMEN COMPLETE
1 series · 13 of 25 positions shown · non-contrast
Comparison: 08/08/2014 and 10/29/2013

CLINICAL DATA: Cirrhosis. Elevated bilirubin. Renal artery
stenosis. History of lung cancer.

EXAM:
ULTRASOUND ABDOMEN COMPLETE

[Series 1: us abdomen complete · 0.17mm/px · 13 of 117 slices shown]
[im 1/117]
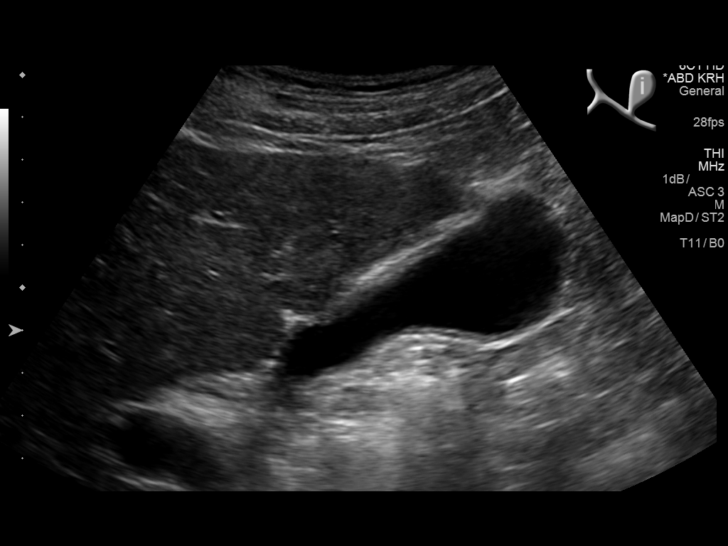
[im 10/117]
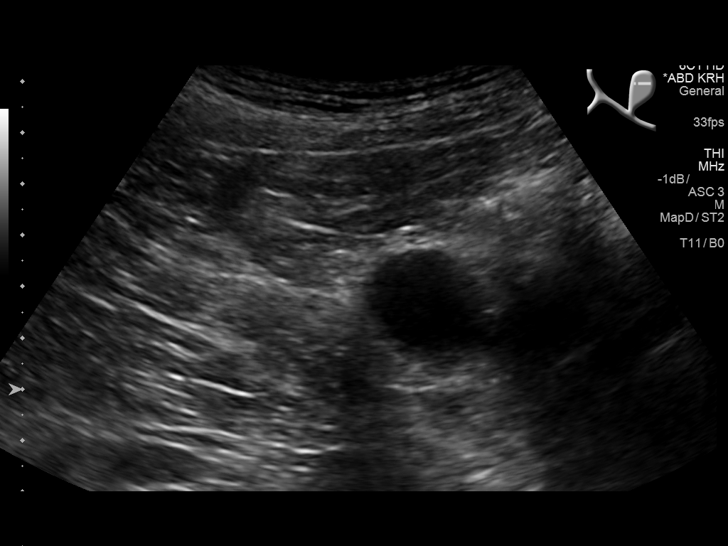
[im 20/117]
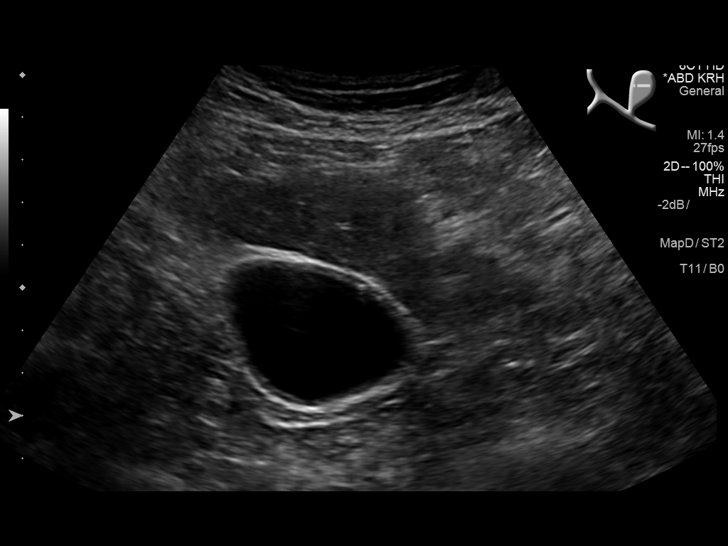
[im 30/117]
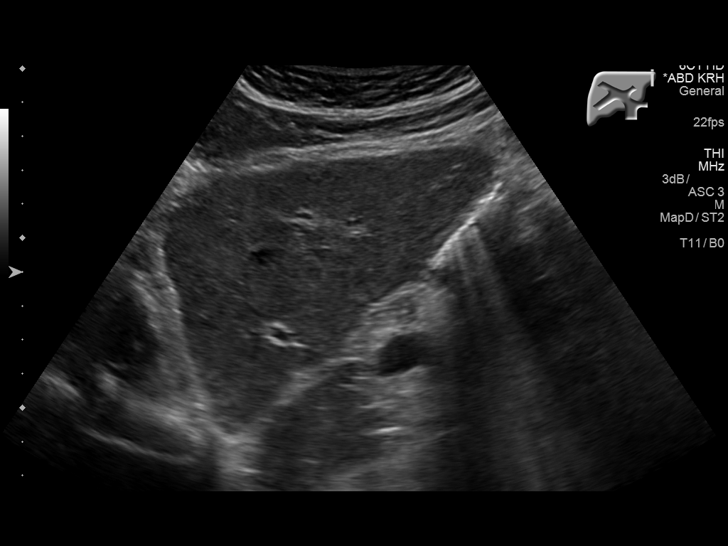
[im 39/117]
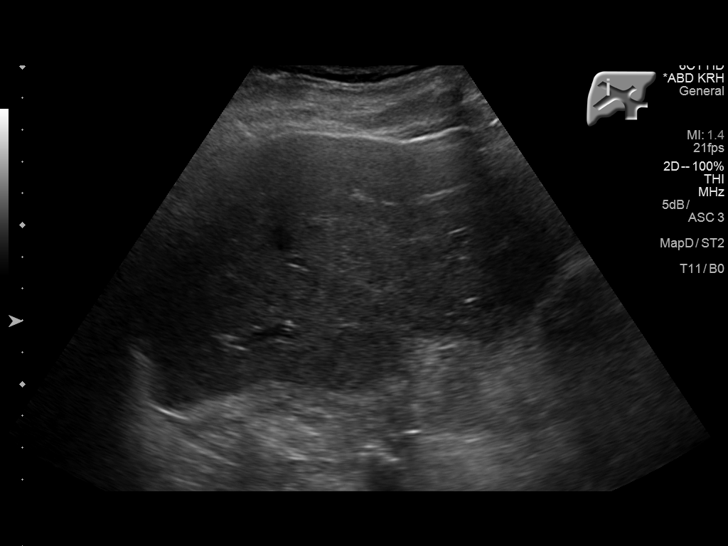
[im 49/117]
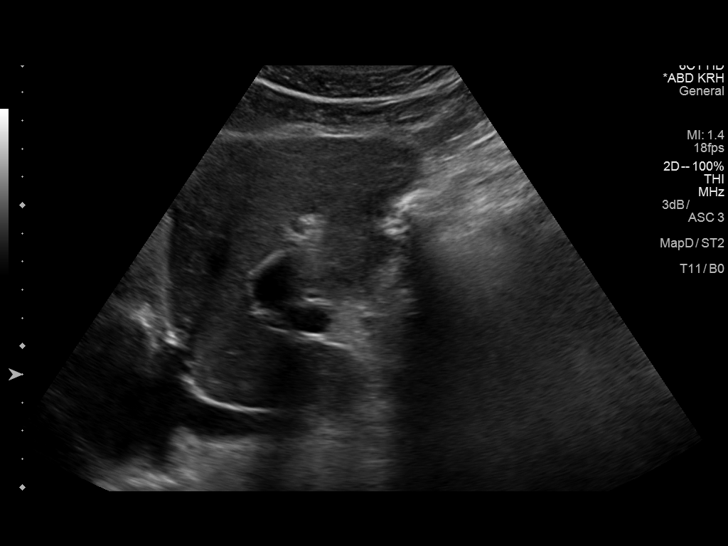
[im 59/117]
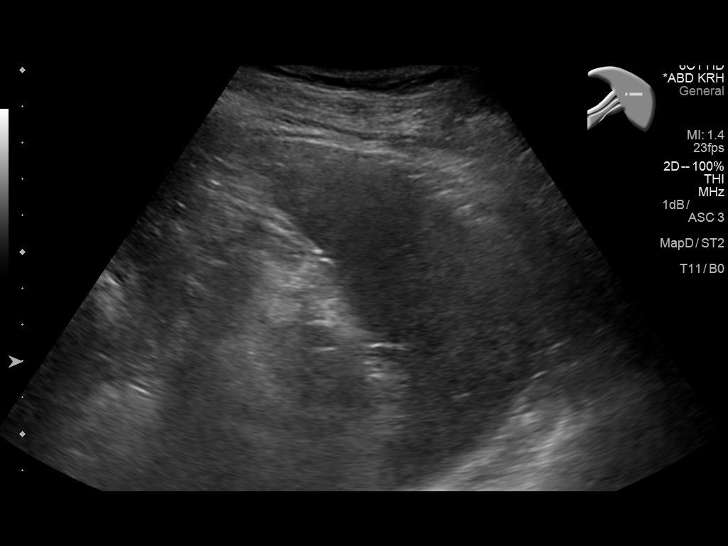
[im 68/117]
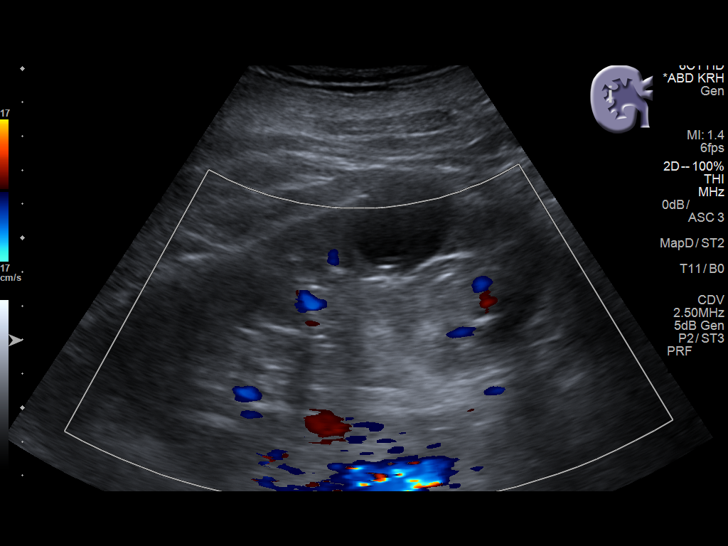
[im 78/117]
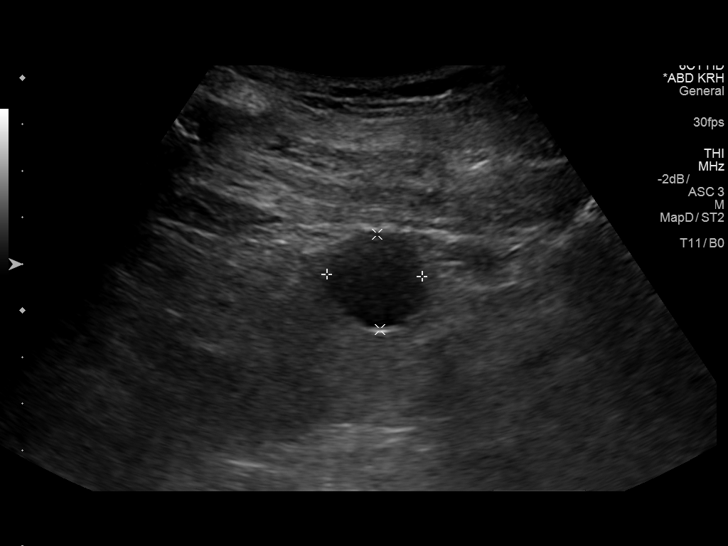
[im 88/117]
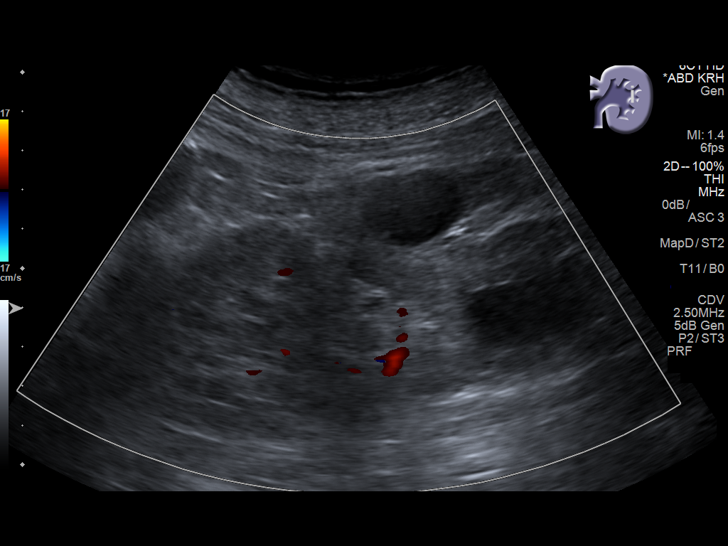
[im 97/117]
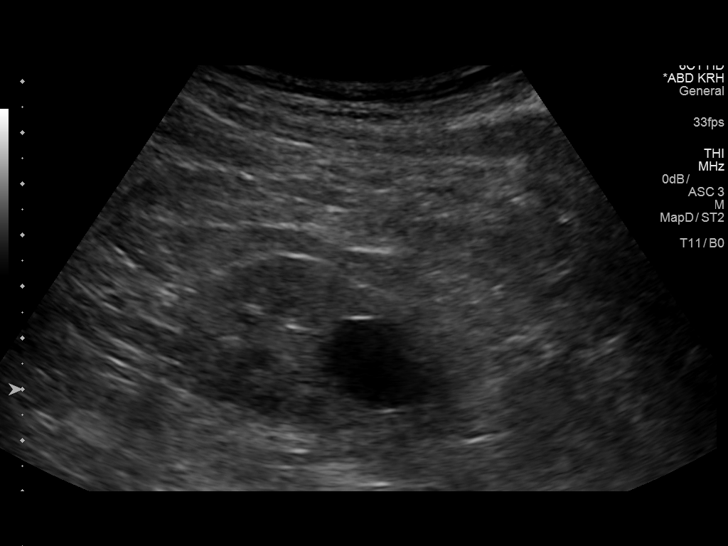
[im 107/117]
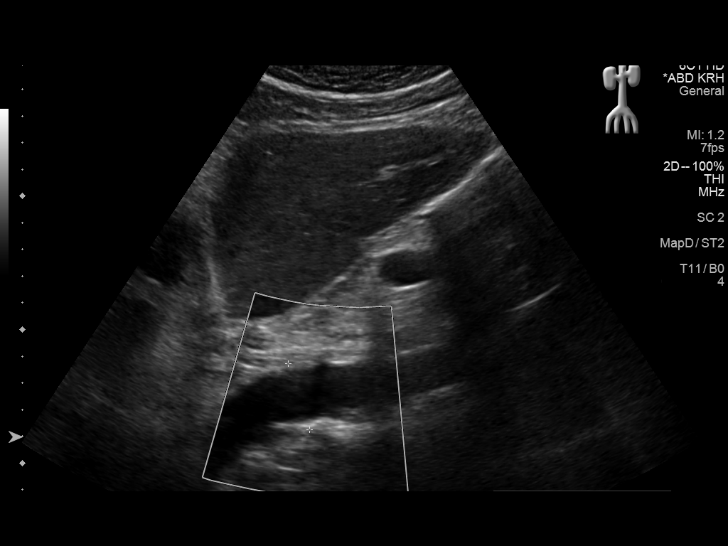
[im 117/117]
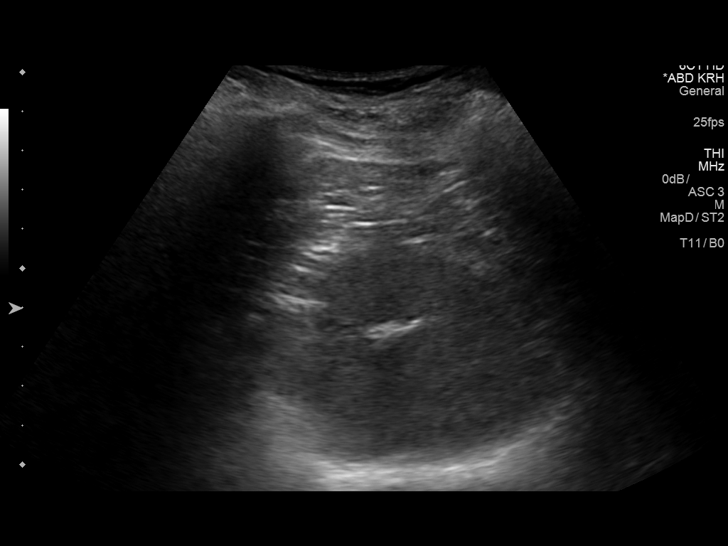

[13 of 25 positions shown; findings below may reference images not displayed]

FINDINGS: Gallbladder: Mild gallbladder wall thickening at 4 mm. Sonographic
Murphy's sign absent. No stones identified.

Common bile duct: Diameter: 2 mm

Liver: Nodular margins compatible with cirrhosis. No focal lesion
identified.

IVC: Poorly seen due to overlying bowel gas.

Pancreas: Visualized pancreatic body normal. Pancreatic tail and
head not seen due to overlying bowel gas.

Spleen: Bandlike hyperechoic structure in the superior anterior
spleen favoring site of prior infarct, faintly calcified. No
splenomegaly.

Right Kidney: Length: 13.0 cm. Renal cysts observed, including a
by 3.0 by 4.0 cm cyst in the mid to lower kidney and also a 2.2 cm
cyst in the mid kidney which has subtle medial septation with mild
calcification.

Left Kidney: Length: 13.7 cm. Bilateral renal cysts measuring up to
3.3 cm in long axis.

Abdominal aorta: No aneurysm visualized. Atherosclerotic.
Incompletely visualized due to overlying bowel gas.

Other findings: None.
IMPRESSION: 1. Hepatic cirrhosis. No biliary dilatation identified to suggest an
obstructive cause for the hyperbilirubinemia, query hepatocellular
dysfunction.
2. Gallbladder wall thickening without sonographic Murphy's sign.
Cause uncertain, cannot exclude acalculous cholecystitis. No
gallstones identified.
3. Bilateral renal cysts are present. A small cyst in the right mid
kidney has a small septation with some calcification along the
medial border.
4. Abdominal aortic atherosclerosis.
5. Faint linear calcification in the spleen likely from prior
splenic infarct.

## 2017-08-30 ENCOUNTER — Ambulatory Visit (INDEPENDENT_AMBULATORY_CARE_PROVIDER_SITE_OTHER): Payer: Medicare Other | Admitting: *Deleted

## 2017-08-30 ENCOUNTER — Encounter: Payer: Self-pay | Admitting: Gastroenterology

## 2017-08-30 DIAGNOSIS — I428 Other cardiomyopathies: Secondary | ICD-10-CM | POA: Diagnosis not present

## 2017-08-30 NOTE — Progress Notes (Signed)
Remote ICD transmission.   

## 2017-08-31 ENCOUNTER — Encounter: Payer: Self-pay | Admitting: Cardiology

## 2017-08-31 ENCOUNTER — Telehealth: Payer: Self-pay | Admitting: Family Medicine

## 2017-08-31 ENCOUNTER — Encounter: Payer: Self-pay | Admitting: Adult Health

## 2017-08-31 NOTE — Telephone Encounter (Signed)
Letter placed on desk

## 2017-08-31 NOTE — Telephone Encounter (Signed)
Copied from [place] [id number]. Topic: General - Other >> Aug 31, 2017  2:04 PM [name], NT wrote: Patient is stating he received a Jury Duty note and he is already on oxygen and the tank last no more than 2 hours, he also is hard of hearing. He is requesting a disability note to opt out. Please contact patient when the note is ready.

## 2017-09-01 NOTE — Telephone Encounter (Signed)
Spoke to the pt and informed him that letter is available for pick up.  Pt requested that letter be mailed.

## 2017-09-07 LAB — CUP PACEART REMOTE DEVICE CHECK
Battery Remaining Percentage: 39 %
Date Time Interrogation Session: 20190220090016
HIGH POWER IMPEDANCE MEASURED VALUE: 70 Ohm
HighPow Impedance: 70 Ohm
Implantable Lead Location: 753860
Implantable Lead Model: 7122
Lead Channel Impedance Value: 1000 Ohm
Lead Channel Impedance Value: 400 Ohm
Lead Channel Pacing Threshold Amplitude: 1.25 V
Lead Channel Pacing Threshold Pulse Width: 0.5 ms
Lead Channel Setting Pacing Amplitude: 2.25 V
MDC IDC LEAD IMPLANT DT: 20100113
MDC IDC LEAD IMPLANT DT: 20100113
MDC IDC LEAD LOCATION: 753858
MDC IDC MSMT BATTERY REMAINING LONGEVITY: 36 mo
MDC IDC MSMT BATTERY VOLTAGE: 2.9 V
MDC IDC MSMT LEADCHNL LV PACING THRESHOLD PULSEWIDTH: 0.5 ms
MDC IDC MSMT LEADCHNL RV PACING THRESHOLD AMPLITUDE: 0.625 V
MDC IDC MSMT LEADCHNL RV SENSING INTR AMPL: 12 mV
MDC IDC PG IMPLANT DT: 20130729
MDC IDC SET LEADCHNL LV PACING PULSEWIDTH: 0.5 ms
MDC IDC SET LEADCHNL RV PACING AMPLITUDE: 2 V
MDC IDC SET LEADCHNL RV PACING PULSEWIDTH: 0.5 ms
MDC IDC SET LEADCHNL RV SENSING SENSITIVITY: 0.5 mV
Pulse Gen Serial Number: 7053988

## 2017-09-27 ENCOUNTER — Telehealth: Payer: Self-pay | Admitting: Gastroenterology

## 2017-09-27 NOTE — Telephone Encounter (Signed)
The pt has been advised he is not a candidate for cologuard and has been scheduled for pre visit and colon

## 2017-09-28 ENCOUNTER — Encounter: Payer: Self-pay | Admitting: Cardiology

## 2017-09-28 ENCOUNTER — Ambulatory Visit (INDEPENDENT_AMBULATORY_CARE_PROVIDER_SITE_OTHER): Payer: Medicare Other | Admitting: Cardiology

## 2017-09-28 VITALS — BP 100/72 | HR 74 | Ht 73.0 in | Wt 190.4 lb

## 2017-09-28 DIAGNOSIS — J449 Chronic obstructive pulmonary disease, unspecified: Secondary | ICD-10-CM

## 2017-09-28 DIAGNOSIS — Z8679 Personal history of other diseases of the circulatory system: Secondary | ICD-10-CM

## 2017-09-28 DIAGNOSIS — I482 Chronic atrial fibrillation: Secondary | ICD-10-CM | POA: Diagnosis not present

## 2017-09-28 DIAGNOSIS — I5042 Chronic combined systolic (congestive) and diastolic (congestive) heart failure: Secondary | ICD-10-CM

## 2017-09-28 DIAGNOSIS — D692 Other nonthrombocytopenic purpura: Secondary | ICD-10-CM

## 2017-09-28 DIAGNOSIS — I4821 Permanent atrial fibrillation: Secondary | ICD-10-CM

## 2017-09-28 DIAGNOSIS — I5022 Chronic systolic (congestive) heart failure: Secondary | ICD-10-CM

## 2017-09-28 NOTE — Progress Notes (Signed)
Cardiology Office Note    Date:  09/28/2017   ID:  [name], DOB Oct 02, 1945, MRN [id number]  PCP:  [name], NP  Cardiologist:   Candee Furbish, MD (former Dr. Ron Parker)    History of Present Illness:  [name] is a 72 y.o. male former Dr. Ron Parker patient here for follow-up of mitral valve disease, bioprosthetic valve in mitral position. as well as atrial fibrillation. He also had AV node ablation, pacemaker. A fibrillation in the past could not be rate controlled. No anticoagulation because of prior CNS bleed. He had prior left atrial appendage occlusion/tied.Therefore, not on chronic anticoagulation.  He was recently admitted 3/16 09/27/15 for near syncope. Orthostatic hypotension was noted. Since his discharge he had been doing well.  Bilirubin has been chronically increased- apparently is having an ultrasound of his liver.  He developed lung cancer and is responded to treatments. Dr. Earlie Server. Continuous oxygen. Resection.  ICD in place January 2010 several days after his mitral valve surgery. Most recent ejection fraction was 35% in 2015. No CAD 12/8/9 cath.  09/28/17 - no CP, no change in baseline Shortness of breath. O2 at baseline. Doing well. No new complaints.   Past Medical History:  Diagnosis Date  . Atrial fibrillation ([place])    AV Node ablation January, 2010, for rapid atrial fib  . Atrial septal defect    Closed with surgery January, 2010  . Automatic implantable cardioverter-defibrillator in situ    LV dysfunction and pacer needed for AV node lesion  . Cardiomyopathy    non-ischemic  . CHF (congestive heart failure) ([place])   . Chronic combined systolic and diastolic CHF (congestive heart failure) ([place])   . Colon polyps   . COPD (chronic obstructive pulmonary disease) (HCC)    O2- 2 liters, nasal cannula, q night   . COPD GOLD II 01/11/2007   PFT's 09/25/13  FEV1  2.18 (61%) ratio 64 no change p B2 and DLC0  32% corrects to 68% - trial off advair and acei rec  starting  08/23/2013     . CVA (cerebral vascular accident) ([place]) 2009   denies residual on 08/14/2013  . Diabetes mellitus without complication ([place])    type 2  . Dyslipidemia   . Dysrhythmia   . Ejection fraction < 50%   . Endocarditis    Bacterial, 2009  . Headache(784.0)    related to stroke only  . HLD (hyperlipidemia)   . Hypertension   . Intracranial hemorrhage (HCC)    Coumadin cannot be used because of the history of his bleed  . Lung cancer ([place]) 11/29/2013   T1N0 Stage Ia non-small cell carcinoma left lung treated with wedge resection  . Mitral regurgitation    Severe symptomatic primary MR due to bacterial endocarditis, treated w/ MVR // Echo 1/18 EF 40-45, diffuse HK, dilated aorta at 42 mm/aortic root 47 mm; linear echogenic structure in ascending aorta - suspect reverberation artifact / consider CT or TEE to rule out dissection flap, bioprosthetic MVR with mean gradient 3, severe LAE, low normal RVSF, severe RAE, moderate TR, PASP 42  . Myocardial infarction ([place]) 2010  . Pacemaker    combo pacer and icd  . Permanent atrial fibrillation    Originally Coumadin use for atrial fibrillation  //   he had intracerebral hemorrhage with an INR of 2.3 June, 2009. Anticoagulation could no longer be used.  //  Rapid atrial fibrillation after inferior MI October, 2010..........Marland Kitchen AV node ablation done at  that time with ICD pacemaker placed (EF 35%).   //   Left atrial appendage tied off at the time of mitral valve surgery January, 2010 (maze pro  . Prosthetic valve dysfunction    Mild mitral stenosis  . Pulmonary hypertension (HCC)    Moderate  . Renal artery stenosis (HCC)    Mild by history  . 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 Spontaneous pneumothorax    right thoracotomy - distant past  . Status post minimally invasive mitral valve replacement with bioprosthetic valve    33 mm Medtronic Mosaic porcine bioprosthesis placed via right mini thoracotomy for bacterial endocarditis  complicated by severe MR and CHF   . Thoracic aortic aneurysm ([place]) 08/11/2016   a - Chest CTA 1/18:  Aneurysmal dilatation of aortic root is noted at 5.1 cm.     Past Surgical History:  Procedure Laterality Date  . APPENDECTOMY    . ASD REPAIR, SECUNDUM  07/17/2008   pericardial patch closure of ASD  . AV NODE ABLATION  07/2008   for rapid atrial fib  . CARDIAC CATHETERIZATION    . CARDIAC DEFIBRILLATOR PLACEMENT  ~ [address]  . CARDIAC VALVE REPLACEMENT    . CATARACT EXTRACTION W/ INTRAOCULAR LENS  IMPLANT, BILATERAL Bilateral   . ESOPHAGOGASTRODUODENOSCOPY (EGD) WITH PROPOFOL N/A 09/29/2016   Procedure: ESOPHAGOGASTRODUODENOSCOPY (EGD) WITH PROPOFOL;  Surgeon: [name], MD;  Location: [facility] ENDOSCOPY;  Service: Endoscopy;  Laterality: N/A;  . HERNIA REPAIR    . IMPLANTABLE CARDIOVERTER DEFIBRILLATOR (ICD) GENERATOR CHANGE N/A 02/06/2012   Procedure: ICD GENERATOR CHANGE;  Surgeon: [name], MD;  Location: [facility] CATH LAB;  Service: Cardiovascular;  Laterality: N/A;  . INSERT / REPLACE / REMOVE PACEMAKER    . MASS BIOPSY Left    neck mass  . MITRAL VALVE REPLACEMENT Right 07/17/2008   17mm Medtronic Mosaic porcine bioprosthesis  . PENILE PROSTHESIS IMPLANT    . RIGHT HEART CATHETERIZATION N/A 08/16/2013   Procedure: RIGHT HEART CATH;  Surgeon: Josue Hector, MD;  Location: [facility] CATH LAB;  Service: Cardiovascular;  Laterality: N/A;  . TEE WITHOUT CARDIOVERSION N/A 09/06/2016   Procedure: TRANSESOPHAGEAL ECHOCARDIOGRAM (TEE);  Surgeon: Dorothy Spark, MD;  Location: [facility] ENDOSCOPY;  Service: Cardiovascular;  Laterality: N/A;  . THORACOTOMY Right 1970's   spontaneous pneumothorax - while in the [place]  . TOE SURGERY     left foot hammer toe  . TONSILLECTOMY    . VIDEO ASSISTED THORACOSCOPY (VATS)/WEDGE RESECTION Left 11/29/2013   Procedure: Video assisted thoracoscopy for wedge resection; mini thoracotomy;  Surgeon: [name], MD;  Location: [place];  Service: Thoracic;   Laterality: Left;    Outpatient Medications Prior to Visit  Medication Sig Dispense Refill  . acetaminophen (TYLENOL) 500 MG tablet Take 500-1,000 mg by mouth every 6 (six) hours as needed for moderate pain or headache.    Marland Kitchen amoxicillin (AMOXIL) 500 MG capsule Take 2,000 mg by mouth as directed. 1 hour prior to dental procedures    . aspirin EC 81 MG tablet Take 1 tablet (81 mg total) by mouth daily. 90 tablet 3  . atorvastatin (LIPITOR) 10 MG tablet Take 10 mg by mouth at bedtime.    . budesonide-formoterol (SYMBICORT) 160-4.5 MCG/ACT inhaler INHALE 2 PUFFS INTO THE LUNGS TWICE A DAY 30.6 Inhaler 2  . carvedilol (COREG) 3.125 MG tablet TAKE 1 TABLET BY MOUTH TWICE A DAY WITH MEALS 180 tablet 1  . furosemide (LASIX) 40 MG  tablet Take 40 mg by mouth daily. May take 1 extra daily as needed for edema    . irbesartan (AVAPRO) 75 MG tablet TAKE 1 TABLET BY MOUTH EVERY DAY 90 tablet 3  . nitroGLYCERIN (NITROSTAT) 0.4 MG SL tablet Place 0.4 mg under the tongue every 5 (five) minutes as needed for chest pain (x 3 doses). Reported on 12/29/2015    . OXYGEN Inhale 2 L/min into the lungs continuous.    . metFORMIN (GLUCOPHAGE) 1000 MG tablet Take 1 tablet (1,000 mg total) by mouth 2 (two) times daily with a meal. 180 tablet 3  . atorvastatin (LIPITOR) 10 MG tablet TAKE 1 TABLET BY MOUTH EVERY DAY 90 tablet 3  . furosemide (LASIX) 40 MG tablet TAKE 1 TABLET BY MOUTH EVERY DAY 90 tablet 3   No facility-administered medications prior to visit.      Allergies:   Anticoagulant compound and Warfarin sodium   Social History   Socioeconomic History  . Marital status: Married    Spouse name: [name]  . Number of children: 0  . Years of education: College  . Highest education level: Not on file  Occupational History  . 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 Financial resource strain: Not on file  . Food insecurity:    Worry: Not on file    Inability: Not  on file  . Transportation needs:    Medical: Not on file    Non-medical: Not on file  Tobacco Use  . Smoking status: Former Smoker    Packs/day: 1.00    Years: 45.00    Pack years: 45.00    Types: Cigarettes    Last attempt to quit: 08/11/2013    Years since quitting: 4.1  . Smokeless tobacco: Never Used  . Tobacco comment: 08/14/2013 "quit smoking in 2009"  Substance and Sexual Activity  . Alcohol use: No    Alcohol/week: 0.0 oz    Comment: 08/14/2013 "used to drink beer; quit:in 1982"  . Drug use: No  . Sexual activity: Yes  Lifestyle  . Physical activity:    Days per week: Not on file    Minutes per session: Not on file  . Stress: Not on file  Relationships  . Social connections:    Talks on phone: Not on file    Gets together: Not on file    Attends religious service: Not on file    Active member of club or organization: Not on file    Attends meetings of clubs or organizations: Not on file    Relationship status: Not on file  Other Topics Concern  . Not on file  Social History Narrative   Patient lives at home with his spouse.   Caffeine Use: none   Worked for the [employer]   Has two boys and a girl. All live local.      Family History:  The patient's family history includes Stomach cancer in his father; Stroke in his mother.   ROS:   Please see the history of present illness.    Review of Systems  All other systems reviewed and are negative.      PHYSICAL EXAM:   VS:  BP 100/72   Pulse 74   Ht 6\' 1"  (1.854 m)   Wt 190 lb 6.4 oz (86.4 kg)   SpO2 (!) 88%   BMI 25.12 kg/m    GEN: Well nourished, well developed, in no  acute distress  HEENT: normal  Neck: no JVD, carotid bruits, or masses Cardiac: RRR; no murmurs, rubs, or gallops,no edema  Respiratory:  clear to auscultation bilaterally, normal work of breathing, home oxygen noted GI: soft, nontender, nondistended, + BS MS: no deformity or atrophy  Skin: warm and dry, no rash, varicose veins noted,  chronic Neuro:  Alert and Oriented x 3, Strength and sensation are intact Psych: euthymic mood, full affect  Wt Readings from Last 3 Encounters:  09/28/17 190 lb 6.4 oz (86.4 kg)  04/26/17 201 lb (91.2 kg)  04/19/17 201 lb 12.8 oz (91.5 kg)      Studies/Labs Reviewed:   EKG:  EKG is not ordered today.    Recent Labs: 04/11/2017: ALT 9; Hemoglobin 15.5; Platelets 169.0 04/27/2017: BUN 20; Creatinine, Ser 0.98; Potassium 4.4; Sodium 138   Lipid Panel    Component Value Date/Time   CHOL 93 04/27/2017 0800   TRIG 119.0 04/27/2017 0800   HDL 43.50 04/27/2017 0800   CHOLHDL 2 04/27/2017 0800   VLDL 23.8 04/27/2017 0800   LDLCALC 25 04/27/2017 0800   LDLDIRECT 80.2 03/31/2011 1411    Additional studies/ records that were reviewed today include:  Prior office notes reviewed, lab work reviewed  ECHO 09/06/16: - Left ventricle: Systolic function was mildly reduced. The   estimated ejection fraction was in the range of 45% to 50%. Wall   motion was normal; there were no regional wall motion   abnormalities. - Mitral valve: S/P MVR with bioprosthetic valve with 33 mm   Medtronic Mosaic porcine bioprosthesis. There is no central   mitral regurgitation or paravalvular leak. There is limited   motion of the posterior leaflet. Mean transmitral gradient is 7   mmHg.   Valve area by pressure half-time: 1.26 cm^2. - Left atrium: The atrium was massively dilated. No evidence of   thrombus in the atrial cavity or appendage but heavy smoke seen   in the left atrium. The appendage was multilobulated and large.   Emptying velocity was decreased. - Right atrium: The atrium was dilated. No evidence of thrombus in   the atrial cavity or appendage.  Impressions:  - Aortic valve leaflets are structurally normal, with normal   coaptation and no regurgitation.   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 The pseudoaneurysm measures 32 x  25   mm. The communication is seen on both 2D (image 21, 22) and 3D   images (image 26).     S/P MVR with bioprosthetic valve with 33 mm Medtronic Mosaic   porcine bioprosthesis. There is no central mitral regurgitation   or paravalvular leak. There is limited motion of the posterior   leaflet however anterior leaflet opens very well. Mean   transmitral gradient is 7 mmHg that is most probably attributed   to prosthesis/patient mismatch. There is no significant stenosis.   ASSESSMENT:    1. Permanent atrial fibrillation ([place])   2. Chronic systolic CHF (congestive heart failure) ([place])   3. Chronic combined systolic and diastolic CHF (congestive heart failure) ([place])   4. COPD GOLD II   5. H/O intracranial hemorrhage   6. Solar purpura ([place])      PLAN:  In order of problems listed above:  1. Permament atrial fibrillation-Overall no symptoms. Unfortunately cannot anticoagulate secondary to history of intracranial hemorrhage, however had LAA clipped with surgery. Had rapid atrial fibrillation with inferior MI in October 2010 rate control was  always an issue therefore he went AV nodal ablation with Bi-V-ICD placement. He is pacemaker dependent.  2. Chronic systolic heart failure-ejection fraction 35%. Doing well, no changes. Meds reviewed.  3. Orthostatic hypotension-hospitalized previously for this. Improved. No recurrence.  4. Knows to take antibiotics prior to dental work. Had previous bacterial endocarditis, severe MR. Coronary arteries were normal prior to valve surgery. 2010. No changes 5. Prior intracranial bleed occurred in 2009 with INR of 2.3. After that point, he was no longer anticoagulated. Once again LAA clipped.  6. Has a history of stage IA non-small cell lung carcinoma left wedge resection. Home oxygen. 7. Solar purpura - asa. Noted on exam.  8. Elevated bilirubin-has been evaluated.     Medication Adjustments/Labs and Tests Ordered: Current medicines are reviewed at  length with the patient today.  Concerns regarding medicines are outlined above.  Medication changes, Labs and Tests ordered today are listed in the Patient Instructions below. Patient Instructions  Medication Instructions:  The current medical regimen is effective;  continue present plan and medications.  Follow-Up: Follow up in 6 months with Truitt Merle, NP.  You will receive a letter in the mail 2 months before you are due.  Please call us when you receive this letter to schedule your follow up appointment.  Follow up in 1 year with Dr. Marlou Porch.  You will receive a letter in the mail 2 months before you are due.  Please call us when you receive this letter to schedule your follow up appointment.  If you need a refill on your cardiac medications before your next appointment, please call your pharmacy.  Thank you for choosing [facility]!!          Signed, Candee Furbish, MD  09/28/2017 9:26 AM    [address], [place], [place]  [address] Phone: [phone]; Fax: [phone]

## 2017-09-28 NOTE — Patient Instructions (Signed)
Medication Instructions:  The current medical regimen is effective;  continue present plan and medications.  Follow-Up: Follow up in 6 months with Lori Gerhardt, NP.  You will receive a letter in the mail 2 months before you are due.  Please call us when you receive this letter to schedule your follow up appointment.  Follow up in 1 year with Dr. Skains.  You will receive a letter in the mail 2 months before you are due.  Please call us when you receive this letter to schedule your follow up appointment.  If you need a refill on your cardiac medications before your next appointment, please call your pharmacy.  Thank you for choosing [facility]!!     

## 2017-09-29 ENCOUNTER — Other Ambulatory Visit: Payer: Self-pay | Admitting: *Deleted

## 2017-09-29 DIAGNOSIS — I77810 Thoracic aortic ectasia: Secondary | ICD-10-CM

## 2017-09-29 DIAGNOSIS — Z9889 Other specified postprocedural states: Secondary | ICD-10-CM

## 2017-09-29 DIAGNOSIS — Q2549 Other congenital malformations of aorta: Secondary | ICD-10-CM

## 2017-09-29 DIAGNOSIS — I428 Other cardiomyopathies: Secondary | ICD-10-CM

## 2017-10-08 ENCOUNTER — Other Ambulatory Visit: Payer: Self-pay | Admitting: Adult Health

## 2017-10-10 NOTE — Telephone Encounter (Signed)
Cory, I do not see that this pt has had a cpx with you.  Did have some lab work done in 04/2017.  Please advise.

## 2017-10-11 ENCOUNTER — Encounter: Payer: Self-pay | Admitting: Gastroenterology

## 2017-10-17 ENCOUNTER — Ambulatory Visit (HOSPITAL_COMMUNITY)
Admission: RE | Admit: 2017-10-17 | Discharge: 2017-10-17 | Disposition: A | Discharge status: Home / Self Care | Payer: Medicare Other | Source: Ambulatory Visit | Attending: Thoracic Surgery (Cardiothoracic Vascular Surgery) | Admitting: Thoracic Surgery (Cardiothoracic Vascular Surgery)

## 2017-10-17 DIAGNOSIS — R918 Other nonspecific abnormal finding of lung field: Secondary | ICD-10-CM | POA: Insufficient documentation

## 2017-10-17 DIAGNOSIS — I2510 Atherosclerotic heart disease of native coronary artery without angina pectoris: Secondary | ICD-10-CM | POA: Diagnosis not present

## 2017-10-17 DIAGNOSIS — I517 Cardiomegaly: Secondary | ICD-10-CM | POA: Diagnosis not present

## 2017-10-17 DIAGNOSIS — I712 Thoracic aortic aneurysm, without rupture: Secondary | ICD-10-CM | POA: Diagnosis not present

## 2017-10-17 DIAGNOSIS — I288 Other diseases of pulmonary vessels: Secondary | ICD-10-CM | POA: Diagnosis not present

## 2017-10-17 DIAGNOSIS — I428 Other cardiomyopathies: Secondary | ICD-10-CM | POA: Diagnosis not present

## 2017-10-17 DIAGNOSIS — Q2543 Congenital aneurysm of aorta: Secondary | ICD-10-CM | POA: Diagnosis not present

## 2017-10-17 LAB — POCT I-STAT CREATININE: CREATININE: 1.1 mg/dL (ref 0.61–1.24)

## 2017-10-17 MED ORDER — NITROGLYCERIN 0.4 MG SL SUBL
0.4000 mg | SUBLINGUAL_TABLET | Freq: Once | SUBLINGUAL | Status: AC
  Administered 2017-10-17: 0.4 mg via SUBLINGUAL
  Filled 2017-10-17: qty 25

## 2017-10-17 MED ORDER — NITROGLYCERIN 0.4 MG SL SUBL
SUBLINGUAL_TABLET | SUBLINGUAL | Status: AC
  Administered 2017-10-17: 0.4 mg via SUBLINGUAL
  Filled 2017-10-17: qty 1

## 2017-10-17 MED ORDER — IOPAMIDOL (ISOVUE-370) INJECTION 76%
100.0000 mL | Freq: Once | INTRAVENOUS | Status: AC | PRN
  Administered 2017-10-17: 80 mL via INTRAVENOUS

## 2017-10-18 ENCOUNTER — Other Ambulatory Visit: Payer: Self-pay | Admitting: Adult Health

## 2017-10-18 DIAGNOSIS — E119 Type 2 diabetes mellitus without complications: Secondary | ICD-10-CM

## 2017-10-19 NOTE — Telephone Encounter (Signed)
Needs A1C Follow Up

## 2017-10-19 NOTE — Telephone Encounter (Signed)
Spoke to the pt and now scheduled for 10/20/17

## 2017-10-20 ENCOUNTER — Ambulatory Visit (INDEPENDENT_AMBULATORY_CARE_PROVIDER_SITE_OTHER): Payer: Medicare Other | Admitting: Adult Health

## 2017-10-20 ENCOUNTER — Encounter: Payer: Self-pay | Admitting: Adult Health

## 2017-10-20 VITALS — BP 110/60 | Temp 97.6°F | Wt 188.0 lb

## 2017-10-20 DIAGNOSIS — E119 Type 2 diabetes mellitus without complications: Secondary | ICD-10-CM

## 2017-10-20 LAB — POCT GLYCOSYLATED HEMOGLOBIN (HGB A1C): HEMOGLOBIN A1C: 6.7

## 2017-10-20 NOTE — Progress Notes (Signed)
Subjective:    Patient ID: [name], male    DOB: 19-Nov-1945, 72 y.o.   MRN: [id number]  HPI 72 year old male who  has a past medical history of Atrial fibrillation ([place]), Atrial septal defect, Automatic implantable cardioverter-defibrillator in situ, Cardiomyopathy, CHF (congestive heart failure) ([place]), Chronic combined systolic and diastolic CHF (congestive heart failure) ([place]), Colon polyps, COPD (chronic obstructive pulmonary disease) ([place]), COPD GOLD II (01/11/2007), CVA (cerebral vascular accident) ([place]) (2009), Diabetes mellitus without complication ([place]), Dyslipidemia, Dysrhythmia, Ejection fraction < 50%, Endocarditis, Headache(784.0), HLD (hyperlipidemia), Hypertension, Intracranial hemorrhage ([place]), Lung cancer ([place]) (11/29/2013), Mitral regurgitation, Myocardial infarction ([place]) (2010), Pacemaker, Permanent atrial fibrillation, Prosthetic valve dysfunction, Pulmonary hypertension ([place]), Renal artery stenosis (HCC), 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  He presents to the office today for 6 month follow up regarding DM II. He is currently maintained on Metformin 1000 mg BID. His last A1c was 7.3 in October 2018.   Today he reports that over the last week his diet has been poor. He has been eating a lot of carbs.   Denies any issues and feels good.    Review of Systems See HPI   Past Medical History:  Diagnosis Date  . Atrial fibrillation ([place])    AV Node ablation January, 2010, for rapid atrial fib  . Atrial septal defect    Closed with surgery January, 2010  . Automatic implantable cardioverter-defibrillator in situ    LV dysfunction and pacer needed for AV node lesion  . Cardiomyopathy    non-ischemic  . CHF (congestive heart failure) ([place])   . Chronic combined systolic and diastolic CHF (congestive heart failure)  ([place])   . Colon polyps   . COPD (chronic obstructive pulmonary disease) (HCC)    O2- 2 liters, nasal cannula, q night   . COPD GOLD II 01/11/2007   PFT's 09/25/13  FEV1  2.18 (61%) ratio 64 no change p B2 and DLC0  32% corrects to 68% - trial off advair and acei rec starting  08/23/2013     . CVA (cerebral vascular accident) ([place]) 2009   denies residual on 08/14/2013  . Diabetes mellitus without complication ([place])    type 2  . Dyslipidemia   . Dysrhythmia   . Ejection fraction < 50%   . Endocarditis    Bacterial, 2009  . Headache(784.0)    related to stroke only  . HLD (hyperlipidemia)   . Hypertension   . Intracranial hemorrhage (HCC)    Coumadin cannot be used because of the history of his bleed  . Lung cancer ([place]) 11/29/2013   T1N0 Stage Ia non-small cell carcinoma left lung treated with wedge resection  . Mitral regurgitation    Severe symptomatic primary MR due to bacterial endocarditis, treated w/ MVR // Echo 1/18 EF 40-45, diffuse HK, dilated aorta at 42 mm/aortic root 47 mm; linear echogenic structure in ascending aorta - suspect reverberation artifact / consider CT or TEE to rule out dissection flap, bioprosthetic MVR with mean gradient 3, severe LAE, low normal RVSF, severe RAE, moderate TR, PASP 42  . Myocardial infarction ([place]) 2010  . Pacemaker    combo pacer and icd  . Permanent atrial fibrillation    Originally Coumadin use for atrial fibrillation  //   he had intracerebral hemorrhage with an INR of 2.3 June, 2009. Anticoagulation could no longer be used.  //  Rapid atrial fibrillation after inferior MI October, 2010..........Marland Kitchen AV node ablation done at that time with ICD pacemaker placed (EF 35%).   //   Left atrial appendage tied off at the time of mitral valve surgery January, 2010 (maze pro  . Prosthetic valve dysfunction    Mild mitral stenosis  . Pulmonary hypertension (HCC)    Moderate  . Renal artery stenosis (HCC)    Mild by history  . Sinus of Valsalva aneurysm  08/26/2016  . Spontaneous pneumothorax    right thoracotomy - distant past  . Status post minimally invasive mitral valve replacement with bioprosthetic valve    33 mm Medtronic Mosaic porcine bioprosthesis placed via right mini thoracotomy for bacterial endocarditis complicated by severe MR and CHF   . Thoracic aortic aneurysm ([place]) 08/11/2016   a - Chest CTA 1/18:  Aneurysmal dilatation of aortic root is noted at 5.1 cm.     Social History   Socioeconomic History  . Marital status: Married    Spouse name: [name]  . Number of children: 0  . Years of education: College  . Highest education level: Not on file  Occupational History  . 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 Financial resource strain: Not on file  . Food insecurity:    Worry: Not on file    Inability: Not on file  . Transportation needs:    Medical: Not on file    Non-medical: Not on file  Tobacco Use  . Smoking status: Former Smoker    Packs/day: 1.00    Years: 45.00    Pack years: 45.00    Types: Cigarettes    Last attempt to quit: 08/11/2013    Years since quitting: 4.1  . Smokeless tobacco: Never Used  . Tobacco comment: 08/14/2013 "quit smoking in 2009"  Substance and Sexual Activity  . Alcohol use: No    Alcohol/week: 0.0 oz    Comment: 08/14/2013 "used to drink beer; quit:in 1982"  . Drug use: No  . Sexual activity: Yes  Lifestyle  . Physical activity:    Days per week: Not on file    Minutes per session: Not on file  . Stress: Not on file  Relationships  . Social connections:    Talks on phone: Not on file    Gets together: Not on file    Attends religious service: Not on file    Active member of club or organization: Not on file    Attends meetings of clubs or organizations: Not on file    Relationship status: Not on file  . Intimate partner violence:    Fear of current or ex partner: Not on file    Emotionally abused: Not on file    Physically  abused: Not on file    Forced sexual activity: Not on file  Other Topics Concern  . Not on file  Social History Narrative   Patient lives at home with his spouse.   Caffeine Use: none   Worked for the post [employer]   Has two boys and a girl. All live local.     Past Surgical History:  Procedure Laterality Date  . APPENDECTOMY    . ASD REPAIR, SECUNDUM  07/17/2008   pericardial patch closure of ASD  . AV NODE ABLATION  07/2008   for rapid atrial fib  . CARDIAC CATHETERIZATION    . CARDIAC DEFIBRILLATOR PLACEMENT  ~ [address]  [place]  . CARDIAC VALVE REPLACEMENT    . CATARACT EXTRACTION W/ INTRAOCULAR LENS  IMPLANT, BILATERAL Bilateral   . ESOPHAGOGASTRODUODENOSCOPY (EGD) WITH PROPOFOL N/A 09/29/2016   Procedure: ESOPHAGOGASTRODUODENOSCOPY (EGD) WITH PROPOFOL;  Surgeon: [name], MD;  Location: [facility] ENDOSCOPY;  Service: Endoscopy;  Laterality: N/A;  . HERNIA REPAIR    . IMPLANTABLE CARDIOVERTER DEFIBRILLATOR (ICD) GENERATOR CHANGE N/A 02/06/2012   Procedure: ICD GENERATOR CHANGE;  Surgeon: [name], MD;  Location: [facility] CATH LAB;  Service: Cardiovascular;  Laterality: N/A;  . INSERT / REPLACE / REMOVE PACEMAKER    . MASS BIOPSY Left    neck mass  . MITRAL VALVE REPLACEMENT Right 07/17/2008   50mm Medtronic Mosaic porcine bioprosthesis  . PENILE PROSTHESIS IMPLANT    . RIGHT HEART CATHETERIZATION N/A 08/16/2013   Procedure: RIGHT HEART CATH;  Surgeon: Josue Hector, MD;  Location: [facility] CATH LAB;  Service: Cardiovascular;  Laterality: N/A;  . TEE WITHOUT CARDIOVERSION N/A 09/06/2016   Procedure: TRANSESOPHAGEAL ECHOCARDIOGRAM (TEE);  Surgeon: Dorothy Spark, MD;  Location: [facility] ENDOSCOPY;  Service: Cardiovascular;  Laterality: N/A;  . THORACOTOMY Right 1970's   spontaneous pneumothorax - while in the [place]  . TOE SURGERY     left foot hammer toe  . TONSILLECTOMY    . VIDEO ASSISTED THORACOSCOPY (VATS)/WEDGE RESECTION Left 11/29/2013   Procedure: Video assisted thoracoscopy for  wedge resection; mini thoracotomy;  Surgeon: [name], MD;  Location: [facility] OR;  Service: Thoracic;  Laterality: Left;    Family History  Problem Relation Age of Onset  . Stomach cancer Father   . Stroke Mother     Allergies  Allergen Reactions  . Anticoagulant Compound Other (See Comments)    Pt had intracranial bleed, therefore all anticoagulation is contra-indicated per Dr. Ron Parker.  . Warfarin Sodium Other (See Comments)    Pt had intracranial bleed, therefore all anticoagulation is contra-indicated per Dr. Ron Parker.     Current Outpatient Medications on File Prior to Visit  Medication Sig Dispense Refill  . acetaminophen (TYLENOL) 500 MG tablet Take 500-1,000 mg by mouth every 6 (six) hours as needed for moderate pain or headache.    Marland Kitchen amoxicillin (AMOXIL) 500 MG capsule Take 2,000 mg by mouth as directed. 1 hour prior to dental procedures    . aspirin EC 81 MG tablet Take 1 tablet (81 mg total) by mouth daily. 90 tablet 3  . atorvastatin (LIPITOR) 10 MG tablet Take 10 mg by mouth at bedtime.    . budesonide-formoterol (SYMBICORT) 160-4.5 MCG/ACT inhaler INHALE 2 PUFFS INTO THE LUNGS TWICE A DAY 30.6 Inhaler 2  . carvedilol (COREG) 3.125 MG tablet TAKE 1 TABLET BY MOUTH TWICE A DAY WITH MEALS 180 tablet 1  . carvedilol (COREG) 3.125 MG tablet TAKE 1 TABLET BY MOUTH TWICE A DAY WITH MEALS 180 tablet 3  . furosemide (LASIX) 40 MG tablet Take 40 mg by mouth daily. May take 1 extra daily as needed for edema    . irbesartan (AVAPRO) 75 MG tablet TAKE 1 TABLET BY MOUTH EVERY DAY 90 tablet 3  . nitroGLYCERIN (NITROSTAT) 0.4 MG SL tablet Place 0.4 mg under the tongue every 5 (five) minutes as needed for chest pain (x 3 doses). Reported on 12/29/2015    . OXYGEN Inhale 2 L/min into the lungs continuous.    . metFORMIN (GLUCOPHAGE) 1000 MG tablet Take 1 tablet (1,000 mg total) by mouth 2 (two) times daily with a meal. 180 tablet 3  No current facility-administered medications on file prior  to visit.     BP 110/60   Temp 97.6 F (36.4 C) (Oral)   Wt 188 lb (85.3 kg)   BMI 24.80 kg/m       Objective:   Physical Exam  Constitutional: He is oriented to person, place, and time. He appears well-developed and well-nourished. No distress.  Cardiovascular: Normal rate, regular rhythm, normal heart sounds and intact distal pulses. Exam reveals no gallop and no friction rub.  No murmur heard. Pulmonary/Chest: Effort normal and breath sounds normal. No respiratory distress. He has no wheezes. He has no rales. He exhibits no tenderness.  Neurological: He is alert and oriented to person, place, and time.  Skin: Skin is warm and dry. No rash noted. He is not diaphoretic. No erythema. No pallor.  Psychiatric: He has a normal mood and affect. His behavior is normal. Thought content normal.  Nursing note and vitals reviewed.     Assessment & Plan:  1. Type 2 diabetes mellitus without complication, without long-term current use of insulin (HCC)  - POCT A1C- 6.7 - has improved  - No change in medications  - Follow up in Oct for CPE   [name], NP

## 2017-10-20 NOTE — Telephone Encounter (Signed)
Sent to the pharmacy by e-scribe.  Per Tommi Rumps, pt to return in 6 months.

## 2017-10-20 NOTE — Patient Instructions (Addendum)
It was great seeing you today   Your A1c has improved from 7.3 to 6.7   Please continue to work on diet...everything in moderation   Follow up in October for physical exam

## 2017-10-23 ENCOUNTER — Ambulatory Visit (INDEPENDENT_AMBULATORY_CARE_PROVIDER_SITE_OTHER): Payer: Medicare Other | Admitting: Thoracic Surgery (Cardiothoracic Vascular Surgery)

## 2017-10-23 ENCOUNTER — Other Ambulatory Visit: Payer: Self-pay

## 2017-10-23 ENCOUNTER — Encounter: Payer: Self-pay | Admitting: Thoracic Surgery (Cardiothoracic Vascular Surgery)

## 2017-10-23 VITALS — BP 106/59 | HR 73 | Resp 16 | Ht 73.0 in | Wt 188.0 lb

## 2017-10-23 DIAGNOSIS — Z953 Presence of xenogenic heart valve: Secondary | ICD-10-CM

## 2017-10-23 DIAGNOSIS — Q2549 Other congenital malformations of aorta: Secondary | ICD-10-CM | POA: Diagnosis not present

## 2017-10-23 NOTE — Patient Instructions (Signed)
Continue all previous medications without any changes at this time  

## 2017-10-23 NOTE — Progress Notes (Signed)
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  He has numerous chronic medical problems and probably should not be considered a candidate for elective surgical repair.  He was last seen in our office on April 10, 2017 at which time the aneurysm appeared radiographically stable.  He returns to our office today and reports no significant problems or complaints.  He is quite limited physically but he has not had any significant problems over the past 6 months.   Current Outpatient Medications  Medication Sig Dispense Refill  . acetaminophen (TYLENOL) 500 MG tablet Take 500-1,000 mg by mouth every 6 (six) hours as needed for moderate pain or headache.    Marland Kitchen amoxicillin (AMOXIL) 500 MG capsule Take 2,000 mg by mouth as directed. 1 hour prior to dental procedures    . aspirin EC 81 MG tablet Take 1 tablet (81 mg total) by mouth daily. 90 tablet 3  . atorvastatin (LIPITOR) 10 MG tablet Take 10 mg by mouth at bedtime.    . budesonide-formoterol (SYMBICORT) 160-4.5 MCG/ACT inhaler INHALE 2 PUFFS INTO THE LUNGS TWICE A DAY 30.6 Inhaler 2  . carvedilol (COREG) 3.125 MG tablet TAKE 1 TABLET BY MOUTH TWICE A DAY WITH MEALS 180 tablet 1  . carvedilol (COREG) 3.125 MG tablet TAKE 1 TABLET BY MOUTH TWICE A DAY WITH MEALS 180 tablet 3  . furosemide (LASIX) 40 MG tablet Take 40 mg by mouth daily. May take 1 extra daily as needed for edema    . irbesartan (AVAPRO) 75 MG tablet TAKE 1 TABLET BY  MOUTH EVERY DAY 90 tablet 3  . metFORMIN (GLUCOPHAGE) 1000 MG tablet TAKE 1 TABLET BY MOUTH TWICE A DAY WITH MEALS 180 tablet 1  . nitroGLYCERIN (NITROSTAT) 0.4 MG SL tablet Place 0.4 mg under the tongue every 5 (five) minutes as needed for chest pain (x 3 doses). Reported on 12/29/2015    . OXYGEN Inhale 2 L/min into the lungs continuous.     No current facility-administered medications for this visit.       Physical Exam:   BP (!) 106/59 (BP Location: Left Arm, Patient Position: Sitting, Cuff Size: Normal)   Pulse 73   Resp 16   Ht 6\' 1"  (1.854 m)   Wt 188 lb (85.3 kg)   SpO2 94% Comment: ON 2 L O2  BMI 24.80 kg/m   General:  Chronically ill-appearing  Chest:   Clear to auscultation  CV:   Regular rate and rhythm without murmur  Incisions:  n/a  Abdomen:  Soft nontender  Extremities:  Warm and well perfused  Diagnostic Tests:  Cardiac TAVR CT  TECHNIQUE: The patient was scanned on a [employer]. A 120 kV retrospective scan was triggered in the descending thoracic aorta at 111 HU's. Gantry rotation speed was 250 msecs and collimation was .6 mm. No beta blockade or nitro were given. The 3D data set was  reconstructed in 5% intervals of the R-R cycle. Systolic and diastolic phases were analyzed on a dedicated work station using MPR, MIP and VRT modes. The patient received 80 cc of contrast.  COMPARISON:  Cardiac/Chest CTA from 04/03/2017  FINDINGS: Aortic Valve: There is a pseudoaneurysm of the right coronary cusp measuring 34 x 32 mm. As a result the right coronary cusp is severely dilated measuring 61 mm in diameter. RCA originates above the pseudoaneurysm.  Aorta: Aortic root is dilated as described above. Sinotubular junction, ascending aorta, aortic arch and descending thoracic aorta have normal size. There is no dissection. Mild diffuse calcifications in the descending thoracic aorta.  Sinotubular Junction: 34 x 32 mm (previously 34 x 31  mm)  Ascending Thoracic Aorta: 34 x 33 mm (unchanged from prior)  Aortic Arch: 29 x 28 mm (unchanged from prior)  Descending Thoracic Aorta: 26 x 26 mm (previously 25 x 25 mm)  Sinus of Valsalva Measurements:  Non-coronary:  49 mm (unchanged from prior)  Right-coronary:  61 mm (unchanged from prior)  Left-coronary:  59 mm (unchanged from prior)  Other findings:  Mitral valve prosthesis sits well in the mitral position.  Left atrium is massively dilated.  There is normal pulmonary vein drainage into the left atrium.  Large left atrial appendage is large with no evidence of a thrombus.  Myocardium of the left ventricular apex is thinned with no evidence of a thrombus.  Pacemaker leads are seen in the right atrium and right ventricle.  Dilated pulmonary artery measuring 38 mm.  IMPRESSION: 1. Stable size of the aortic root with a pseudoaneurysm of the right coronary cusp.  2. Sinotubular junction, ascending aorta, aortic arch and descending thoracic aorta have normal size and unchanged from the prior study. There is no dissection.  3. Mitral valve prosthesis sits well in the mitral position. Left atrium is massively dilated.  4. Myocardium of the left ventricular apex is thinned and mildly aneurysmal with no evidence of a thrombus. Unchanged from the prior study.  5. Significant calcifications of all three coronary arteries.  6. Dilated pulmonary artery measuring 38 mm.   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  I have personally reviewed the patient's recent follow-up gated CT angiogram which appears unchanged in comparison with the last gated CTA.  I feel the patient would likely do extremely poorly with any attempt at elective surgical repair.  In addition, he should not be considered candidate for emergency surgical  intervention.  Plan:  We plan follow-up cardiac gated CT angiogram in 1 year.  All questions answered.  I spent in excess of 15 minutes during the conduct of this office consultation and >50% of this time involved direct face-to-face encounter with the patient for counseling and/or coordination of their care.    Valentina Gu. Roxy Manns, MD 10/23/2017 5:07 PM

## 2017-11-17 ENCOUNTER — Encounter: Payer: Medicare Other | Admitting: Gastroenterology

## 2017-11-29 ENCOUNTER — Ambulatory Visit (INDEPENDENT_AMBULATORY_CARE_PROVIDER_SITE_OTHER): Payer: Medicare Other | Admitting: *Deleted

## 2017-11-29 DIAGNOSIS — I5022 Chronic systolic (congestive) heart failure: Secondary | ICD-10-CM

## 2017-11-29 DIAGNOSIS — I428 Other cardiomyopathies: Secondary | ICD-10-CM | POA: Diagnosis not present

## 2017-11-29 NOTE — Progress Notes (Signed)
Remote ICD transmission.   

## 2017-11-30 LAB — CUP PACEART REMOTE DEVICE CHECK
Battery Remaining Percentage: 37 %
Battery Voltage: 2.89 V
HIGH POWER IMPEDANCE MEASURED VALUE: 78 Ohm
HighPow Impedance: 64 Ohm
Implantable Lead Implant Date: 20100113
Implantable Lead Location: 753858
Implantable Lead Model: 7122
Implantable Pulse Generator Implant Date: 20130729
Lead Channel Impedance Value: 400 Ohm
Lead Channel Pacing Threshold Amplitude: 0.625 V
Lead Channel Sensing Intrinsic Amplitude: 12 mV
Lead Channel Setting Pacing Amplitude: 2 V
Lead Channel Setting Pacing Pulse Width: 0.5 ms
Lead Channel Setting Pacing Pulse Width: 0.5 ms
MDC IDC LEAD IMPLANT DT: 20100113
MDC IDC LEAD LOCATION: 753860
MDC IDC MSMT BATTERY REMAINING LONGEVITY: 34 mo
MDC IDC MSMT LEADCHNL LV IMPEDANCE VALUE: 910 Ohm
MDC IDC MSMT LEADCHNL LV PACING THRESHOLD AMPLITUDE: 1.125 V
MDC IDC MSMT LEADCHNL LV PACING THRESHOLD PULSEWIDTH: 0.5 ms
MDC IDC MSMT LEADCHNL RV PACING THRESHOLD PULSEWIDTH: 0.5 ms
MDC IDC SESS DTM: 20190522124759
MDC IDC SET LEADCHNL LV PACING AMPLITUDE: 2.125
MDC IDC SET LEADCHNL RV SENSING SENSITIVITY: 0.5 mV
Pulse Gen Serial Number: 7053988

## 2017-12-02 ENCOUNTER — Other Ambulatory Visit: Payer: Self-pay | Admitting: Adult Health

## 2017-12-05 NOTE — Telephone Encounter (Signed)
Sent to the pharmacy by e-scribe for 6 months.  Due back 04/2018 for cpx.

## 2017-12-13 ENCOUNTER — Other Ambulatory Visit: Payer: Self-pay | Admitting: Adult Health

## 2017-12-13 NOTE — Telephone Encounter (Signed)
I do not see where this has been prescribed in the past.

## 2017-12-13 NOTE — Telephone Encounter (Signed)
Please call pharmacy.  I have not heard of his Avapro being on the recall list.  This medication is never been prescribed and his blood pressure is under good control with the medications that he is on.

## 2017-12-14 ENCOUNTER — Telehealth: Payer: Self-pay | Admitting: Adult Health

## 2017-12-14 NOTE — Telephone Encounter (Signed)
Ok to switch 90 + 1

## 2017-12-14 NOTE — Telephone Encounter (Signed)
Left a message for a return call.  Pharmacy stated the Avapro is on back order.  Pt could see if another pharmacy has prescription.  Will see what pt wants to do.

## 2017-12-14 NOTE — Telephone Encounter (Signed)
[facility] and [facility] one [facility] irbesartan.  Pt would like med changed. Please advise.

## 2017-12-14 NOTE — Telephone Encounter (Signed)
Copied from [place] [id number]. Topic: Quick Communication - See Telephone Encounter >> Dec 14, 2017  4:40 PM Percell Belt A wrote: CRM for notification. See Telephone encounter for: 12/14/17. Pt called state that the pharmacy told him that they no longer have the irbesartan (AVAPRO) 75 MG tablet [[id number]] .  He would like to know if there is something that he can take instead of this?    Please advise

## 2017-12-15 MED ORDER — VALSARTAN 80 MG PO TABS: 80.0000 mg | ORAL_TABLET | Freq: Every day | ORAL | 1 refills | Status: DC

## 2017-12-15 NOTE — Addendum Note (Signed)
Addended by: Miles Costain T on: 12/15/2017 07:52 AM   Modules accepted: Orders

## 2017-12-15 NOTE — Telephone Encounter (Signed)
Sent to the pharmacy by e-scribe. 

## 2017-12-15 NOTE — Telephone Encounter (Signed)
Wife notified that new rx has been sent to the pharmacy.

## 2017-12-22 ENCOUNTER — Inpatient Hospital Stay: Payer: Medicare Other | Attending: Internal Medicine

## 2017-12-22 ENCOUNTER — Ambulatory Visit (HOSPITAL_COMMUNITY)
Admission: RE | Admit: 2017-12-22 | Discharge: 2017-12-22 | Disposition: A | Discharge status: Home / Self Care | Payer: Medicare Other | Source: Ambulatory Visit | Attending: Internal Medicine | Admitting: Internal Medicine

## 2017-12-22 DIAGNOSIS — Z902 Acquired absence of lung [part of]: Secondary | ICD-10-CM | POA: Diagnosis not present

## 2017-12-22 DIAGNOSIS — R932 Abnormal findings on diagnostic imaging of liver and biliary tract: Secondary | ICD-10-CM | POA: Insufficient documentation

## 2017-12-22 DIAGNOSIS — I289 Disease of pulmonary vessels, unspecified: Secondary | ICD-10-CM | POA: Diagnosis not present

## 2017-12-22 DIAGNOSIS — J439 Emphysema, unspecified: Secondary | ICD-10-CM | POA: Diagnosis not present

## 2017-12-22 DIAGNOSIS — I700 Atherosclerosis of aorta: Secondary | ICD-10-CM | POA: Insufficient documentation

## 2017-12-22 DIAGNOSIS — C3412 Malignant neoplasm of upper lobe, left bronchus or lung: Secondary | ICD-10-CM | POA: Insufficient documentation

## 2017-12-22 DIAGNOSIS — I517 Cardiomegaly: Secondary | ICD-10-CM | POA: Insufficient documentation

## 2017-12-22 DIAGNOSIS — C3490 Malignant neoplasm of unspecified part of unspecified bronchus or lung: Secondary | ICD-10-CM | POA: Diagnosis not present

## 2017-12-22 DIAGNOSIS — Z9981 Dependence on supplemental oxygen: Secondary | ICD-10-CM | POA: Diagnosis not present

## 2017-12-22 LAB — CBC WITH DIFFERENTIAL/PLATELET
BASOS ABS: 0.1 10*3/uL (ref 0.0–0.1)
BASOS PCT: 1 %
EOS ABS: 0.2 10*3/uL (ref 0.0–0.5)
Eosinophils Relative: 3 %
HCT: 44.3 % (ref 38.4–49.9)
Hemoglobin: 14.1 g/dL (ref 13.0–17.1)
Lymphocytes Relative: 9 %
Lymphs Abs: 0.6 10*3/uL — ABNORMAL LOW (ref 0.9–3.3)
MCH: 27.4 pg (ref 27.2–33.4)
MCHC: 31.9 g/dL — ABNORMAL LOW (ref 32.0–36.0)
MCV: 86.1 fL (ref 79.3–98.0)
MONO ABS: 0.7 10*3/uL (ref 0.1–0.9)
Monocytes Relative: 10 %
Neutro Abs: 5.7 10*3/uL (ref 1.5–6.5)
Neutrophils Relative %: 77 %
Platelets: 176 10*3/uL (ref 140–400)
RBC: 5.14 MIL/uL (ref 4.20–5.82)
RDW: 17.4 % — AB (ref 11.0–14.6)
WBC: 7.3 10*3/uL (ref 4.0–10.3)

## 2017-12-22 LAB — COMPREHENSIVE METABOLIC PANEL
ALBUMIN: 4.4 g/dL (ref 3.5–5.0)
ALT: 6 U/L (ref 0–55)
AST: 11 U/L (ref 5–34)
Alkaline Phosphatase: 65 U/L (ref 40–150)
Anion gap: 11 (ref 3–11)
BUN: 26 mg/dL (ref 7–26)
CHLORIDE: 100 mmol/L (ref 98–109)
CO2: 30 mmol/L — ABNORMAL HIGH (ref 22–29)
Calcium: 10.4 mg/dL (ref 8.4–10.4)
Creatinine, Ser: 1.16 mg/dL (ref 0.70–1.30)
GFR calc Af Amer: >60 mL/min (ref >60)
GFR calc non Af Amer: >60 mL/min (ref >60)
GLUCOSE: 120 mg/dL (ref 70–140)
POTASSIUM: 5.2 mmol/L — AB (ref 3.5–5.1)
Sodium: 141 mmol/L (ref 136–145)
Total Bilirubin: 2.3 mg/dL — ABNORMAL HIGH (ref 0.2–1.2)
Total Protein: 7.6 g/dL (ref 6.4–8.3)

## 2017-12-22 MED ORDER — IOHEXOL 300 MG/ML  SOLN
75.0000 mL | Freq: Once | INTRAMUSCULAR | Status: AC | PRN
  Administered 2017-12-22: 75 mL via INTRAVENOUS

## 2017-12-25 ENCOUNTER — Inpatient Hospital Stay (HOSPITAL_BASED_OUTPATIENT_CLINIC_OR_DEPARTMENT_OTHER): Payer: Medicare Other | Admitting: Internal Medicine

## 2017-12-25 ENCOUNTER — Encounter: Payer: Self-pay | Admitting: Internal Medicine

## 2017-12-25 ENCOUNTER — Telehealth: Payer: Self-pay | Admitting: Internal Medicine

## 2017-12-25 DIAGNOSIS — Z9981 Dependence on supplemental oxygen: Secondary | ICD-10-CM | POA: Diagnosis not present

## 2017-12-25 DIAGNOSIS — C3490 Malignant neoplasm of unspecified part of unspecified bronchus or lung: Secondary | ICD-10-CM | POA: Diagnosis not present

## 2017-12-25 DIAGNOSIS — C3412 Malignant neoplasm of upper lobe, left bronchus or lung: Secondary | ICD-10-CM | POA: Diagnosis not present

## 2017-12-25 DIAGNOSIS — Z902 Acquired absence of lung [part of]: Secondary | ICD-10-CM | POA: Diagnosis not present

## 2017-12-25 NOTE — Progress Notes (Signed)
[facility] [phone]   [phone]  OFFICE PROGRESS NOTE  [name], NP [place] [address]  DIAGNOSIS: Stage IA (T1a, N0, M0) non-small cell lung cancer, well-differentiated adenocarcinoma of the left upper lobe diagnosed in February of 2015   PRIOR THERAPY: Status post wedge resection of the left upper lobe under the care of Dr. Roxy Manns on 11/29/2013.  CURRENT THERAPY: Observation.  INTERVAL HISTORY: [name] 72 y.o. male returns to the clinic today for annual follow-up visit.  The patient is feeling fine today with no specific complaints except for the baseline shortness of breath since his surgery and he is currently on home oxygen.  He denied having any fever or chills.  He has no chest pain, cough or hemoptysis.  He denied having any weight loss or night sweats.  He had repeat CT scan of the chest performed recently and he is here for evaluation and discussion of his discuss results.   MEDICAL HISTORY: Past Medical History:  Diagnosis Date  . Atrial fibrillation ([place])    AV Node ablation January, 2010, for rapid atrial fib  . Atrial septal defect    Closed with surgery January, 2010  . Automatic implantable cardioverter-defibrillator in situ    LV dysfunction and pacer needed for AV node lesion  . Cardiomyopathy    non-ischemic  . CHF (congestive heart failure) ([place])   . Chronic combined systolic and diastolic CHF (congestive heart failure) ([place])   . Colon polyps   . COPD (chronic obstructive pulmonary disease) (HCC)    O2- 2 liters, nasal cannula, q night   . COPD GOLD II 01/11/2007   PFT's 09/25/13  FEV1  2.18 (61%) ratio 64 no change p B2 and DLC0  32% corrects to 68% - trial off advair and acei rec starting  08/23/2013     . CVA (cerebral vascular accident) ([place]) 2009   denies residual on 08/14/2013  . Diabetes mellitus without complication ([place])    type 2  . Dyslipidemia   . Dysrhythmia   .  Ejection fraction < 50%   . Endocarditis    Bacterial, 2009  . Headache(784.0)    related to stroke only  . HLD (hyperlipidemia)   . Hypertension   . Intracranial hemorrhage (HCC)    Coumadin cannot be used because of the history of his bleed  . Lung cancer ([place]) 11/29/2013   T1N0 Stage Ia non-small cell carcinoma left lung treated with wedge resection  . Mitral regurgitation    Severe symptomatic primary MR due to bacterial endocarditis, treated w/ MVR // Echo 1/18 EF 40-45, diffuse HK, dilated aorta at 42 mm/aortic root 47 mm; linear echogenic structure in ascending aorta - suspect reverberation artifact / consider CT or TEE to rule out dissection flap, bioprosthetic MVR with mean gradient 3, severe LAE, low normal RVSF, severe RAE, moderate TR, PASP 42  . Myocardial infarction ([place]) 2010  . Pacemaker    combo pacer and icd  . Permanent atrial fibrillation    Originally Coumadin use for atrial fibrillation  //   he had intracerebral hemorrhage with an INR of 2.3 June, 2009. Anticoagulation could no longer be used.  //  Rapid atrial fibrillation after inferior MI October, 2010..........Marland Kitchen AV node ablation done at that time with ICD pacemaker placed (EF 35%).   //   Left atrial appendage tied off at the time of mitral valve surgery January, 2010 (maze pro  .  Prosthetic valve dysfunction    Mild mitral stenosis  . Pulmonary hypertension (HCC)    Moderate  . Renal artery stenosis (HCC)    Mild by history  . 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 Spontaneous pneumothorax    right thoracotomy - distant past  . Status post minimally invasive mitral valve replacement with bioprosthetic valve    33 mm Medtronic Mosaic porcine bioprosthesis placed via right mini thoracotomy for bacterial endocarditis complicated by severe MR and CHF   . Thoracic aortic aneurysm ([place]) 08/11/2016   a - Chest CTA 1/18:  Aneurysmal dilatation of aortic root is noted at 5.1 cm.     ALLERGIES:  is allergic to  anticoagulant compound and warfarin sodium.  MEDICATIONS:  Current Outpatient Medications  Medication Sig Dispense Refill  . acetaminophen (TYLENOL) 500 MG tablet Take 500-1,000 mg by mouth every 6 (six) hours as needed for moderate pain or headache.    Marland Kitchen amoxicillin (AMOXIL) 500 MG capsule Take 2,000 mg by mouth as directed. 1 hour prior to dental procedures    . aspirin EC 81 MG tablet Take 1 tablet (81 mg total) by mouth daily. 90 tablet 3  . atorvastatin (LIPITOR) 10 MG tablet Take 10 mg by mouth at bedtime.    . budesonide-formoterol (SYMBICORT) 160-4.5 MCG/ACT inhaler INHALE 2 PUFFS INTO THE LUNGS TWICE A DAY 30.6 Inhaler 2  . carvedilol (COREG) 3.125 MG tablet TAKE 1 TABLET BY MOUTH TWICE A DAY WITH MEALS 180 tablet 1  . carvedilol (COREG) 3.125 MG tablet TAKE 1 TABLET BY MOUTH TWICE A DAY WITH MEALS 180 tablet 3  . furosemide (LASIX) 40 MG tablet Take 40 mg by mouth daily. May take 1 extra daily as needed for edema    . metFORMIN (GLUCOPHAGE) 1000 MG tablet TAKE 1 TABLET BY MOUTH TWICE A DAY WITH MEALS 180 tablet 1  . nitroGLYCERIN (NITROSTAT) 0.4 MG SL tablet Place 0.4 mg under the tongue every 5 (five) minutes as needed for chest pain (x 3 doses). Reported on 12/29/2015    . OXYGEN Inhale 2 L/min into the lungs continuous.    . valsartan (DIOVAN) 80 MG tablet Take 1 tablet (80 mg total) by mouth daily. 90 tablet 1   No current facility-administered medications for this visit.     SURGICAL HISTORY:  Past Surgical History:  Procedure Laterality Date  . APPENDECTOMY    . ASD REPAIR, SECUNDUM  07/17/2008   pericardial patch closure of ASD  . AV NODE ABLATION  07/2008   for rapid atrial fib  . CARDIAC CATHETERIZATION    . CARDIAC DEFIBRILLATOR PLACEMENT  ~ [address] [place]  . CARDIAC VALVE REPLACEMENT    . CATARACT EXTRACTION W/ INTRAOCULAR LENS  IMPLANT, BILATERAL Bilateral   . ESOPHAGOGASTRODUODENOSCOPY (EGD) WITH PROPOFOL N/A 09/29/2016   Procedure: ESOPHAGOGASTRODUODENOSCOPY  (EGD) WITH PROPOFOL;  Surgeon: [name], MD;  Location: [facility] ENDOSCOPY;  Service: Endoscopy;  Laterality: N/A;  . HERNIA REPAIR    . IMPLANTABLE CARDIOVERTER DEFIBRILLATOR (ICD) GENERATOR CHANGE N/A 02/06/2012   Procedure: ICD GENERATOR CHANGE;  Surgeon: [name], MD;  Location: [facility] CATH LAB;  Service: Cardiovascular;  Laterality: N/A;  . INSERT / REPLACE / REMOVE PACEMAKER    . MASS BIOPSY Left    neck mass  . MITRAL VALVE REPLACEMENT Right 07/17/2008   50mm Medtronic Mosaic porcine bioprosthesis  . PENILE PROSTHESIS IMPLANT    . RIGHT HEART CATHETERIZATION N/A 08/16/2013   Procedure: RIGHT HEART CATH;  Surgeon: Josue Hector, MD;  Location: [facility] CATH LAB;  Service: Cardiovascular;  Laterality: N/A;  . TEE WITHOUT CARDIOVERSION N/A 09/06/2016   Procedure: TRANSESOPHAGEAL ECHOCARDIOGRAM (TEE);  Surgeon: Dorothy Spark, MD;  Location[facility] ENDOSCOPY;  Service: Cardiovascular;  Laterality: N/A;  . THORACOTOMY Right 1970's   spontaneous pneumothorax - while in the [facility]  . TOE SURGERY     left foot hammer toe  . TONSILLECTOMY    . VIDEO ASSISTED THORACOSCOPY (VATS)/WEDGE RESECTION Left 11/29/2013   Procedure: Video assisted thoracoscopy for wedge resection; mini thoracotomy;  Surgeon: [name], MD;  Location: [facility];  Service: Thoracic;  Laterality: Left;    REVIEW OF SYSTEMS:  A comprehensive review of systems was negative except for: Respiratory: positive for dyspnea on exertion   PHYSICAL EXAMINATION: General appearance: alert, cooperative and no distress Head: Normocephalic, without obvious abnormality, atraumatic Neck: no adenopathy, no JVD, supple, symmetrical, trachea midline and thyroid not enlarged, symmetric, no tenderness/mass/nodules Lymph nodes: Cervical, supraclavicular, and axillary nodes normal. Resp: clear to auscultation bilaterally Back: negative, symmetric, no curvature. ROM normal. No CVA tenderness. Cardio: regular rate and rhythm, S1, S2 normal, no  murmur, click, rub or gallop GI: soft, non-tender; bowel sounds normal; no masses,  no organomegaly Extremities: extremities normal, atraumatic, no cyanosis or edema  ECOG PERFORMANCE STATUS: 1 - Symptomatic but completely ambulatory  Blood pressure (!) 100/55, pulse 70, temperature (!) 97.5 F (36.4 C), temperature source Oral, resp. rate 18, height 6\' 1"  (1.854 m), weight 183 lb 3.2 oz (83.1 kg), SpO2 95 %.  LABORATORY DATA: Lab Results  Component Value Date   WBC 7.3 12/22/2017   HGB 14.1 12/22/2017   HCT 44.3 12/22/2017   MCV 86.1 12/22/2017   PLT 176 12/22/2017      Chemistry      Component Value Date/Time   NA 141 12/22/2017 1020   NA 140 12/20/2016 0746   K 5.2 (H) 12/22/2017 1020   K 5.1 12/20/2016 0746   CL 100 12/22/2017 1020   CO2 30 (H) 12/22/2017 1020   CO2 29 12/20/2016 0746   BUN 26 12/22/2017 1020   BUN 20.6 12/20/2016 0746   CREATININE 1.16 12/22/2017 1020   CREATININE 1.2 12/20/2016 0746      Component Value Date/Time   CALCIUM 10.4 12/22/2017 1020   CALCIUM 10.0 12/20/2016 0746   ALKPHOS 65 12/22/2017 1020   ALKPHOS 64 12/20/2016 0746   AST 11 12/22/2017 1020   AST 11 12/20/2016 0746   ALT 6 12/22/2017 1020   ALT 8 12/20/2016 0746   BILITOT 2.3 (H) 12/22/2017 1020   BILITOT 3.01 (H) 12/20/2016 0746       RADIOGRAPHIC STUDIES: Ct Chest W Contrast  Result Date: 12/22/2017 CLINICAL DATA:  Restaging lung cancer. EXAM: CT CHEST WITH CONTRAST TECHNIQUE: Multidetector CT imaging of the chest was performed during intravenous contrast administration. CONTRAST:  75mL OMNIPAQUE IOHEXOL 300 MG/ML  SOLN COMPARISON:  CT chest 12/21/2016. FINDINGS: Cardiovascular: Cardiac enlargement noted. Calcifications in the LAD, left circumflex, left main and RCA coronary arteries noted. Aortic atherosclerosis identified. Enlargement of the pulmonary arteries again noted suggestive of PA hypertension. Mediastinum/Nodes: No enlarged hilar, or axillary lymph nodes. Stable  sub- carinal lymph node measuring 1.7 cm, image 74/2. Right hilar lymph node measures 1.4 cm, image 75/2. Unchanged from previous exam. Thyroid gland, trachea, and esophagus demonstrate no significant findings. Lungs/Pleura: Bullous emphysema identified. No pleural effusions, airspace consolidation or pneumothorax. Status post prior partial lobectomy/wedge resection from the  left upper lobe. Stable 3 mm left upper lobe lung nodule, image 46/2. Scattered calcified granulomas identified in the left lower lobe. Right upper lobe and right middle lobe granulomas again noted. No suspicious pulmonary nodules or masses identified. Upper Abdomen: There the contour the liver appears irregular. The lateral segment of left lobe of liver appears slightly enlarged. No focal scratch set no acute abnormality noted within the upper abdomen. Musculoskeletal: Multi level spondylosis identified within the thoracic spine. No suspicious bone lesions identified. IMPRESSION: 1. Stable exam. No evidence for recurrent tumor or metastatic disease. 2. Stable borderline enlarged mediastinal and right hilar lymph nodes. 3. Aortic Atherosclerosis (ICD10-I70.0) and Emphysema (ICD10-J43.9). 4. Coronary artery calcifications 5. Morphologic features of liver suggestive of cirrhosis. 6. Cardiac enlargement 7. Enlarged pulmonary artery suggestive of PA hypertension. Electronically Signed   By: Kerby Moors M.D.   On: 12/22/2017 13:32    ASSESSMENT AND PLAN:  This is a very pleasant 72 years old white male with a stage IA non-small cell lung cancer status post wedge resection of the left upper lobe in 2015. The patient has been in observation since that time.  He continues to have baseline shortness of breath and currently on home oxygen.  Repeat CT scan of the chest showed no concerning findings for disease progression.  I discussed the scan results with the patient recommended for him to continue on observation with repeat CT scan of the chest  in 1 year.  He was advised to call immediately if he has any concerning symptoms in the interval. The patient voices understanding of current disease status and treatment options and is in agreement with the current care plan.  All questions were answered. The patient knows to call the clinic with any problems, questions or concerns. We can certainly see the patient much sooner if necessary.  Disclaimer: This note was dictated with voice recognition software. Similar sounding words can inadvertently be transcribed and may not be corrected upon review.

## 2017-12-25 NOTE — Telephone Encounter (Signed)
Scheduled appt per 6/17 los- pt to get an updated schedule in the mail.

## 2017-12-26 ENCOUNTER — Other Ambulatory Visit (INDEPENDENT_AMBULATORY_CARE_PROVIDER_SITE_OTHER): Payer: Medicare Other

## 2017-12-26 ENCOUNTER — Ambulatory Visit (INDEPENDENT_AMBULATORY_CARE_PROVIDER_SITE_OTHER): Payer: Medicare Other | Admitting: Gastroenterology

## 2017-12-26 ENCOUNTER — Encounter: Payer: Self-pay | Admitting: Gastroenterology

## 2017-12-26 VITALS — BP 108/58 | HR 64 | Ht 73.0 in | Wt 184.1 lb

## 2017-12-26 DIAGNOSIS — K7460 Unspecified cirrhosis of liver: Secondary | ICD-10-CM

## 2017-12-26 LAB — COMPREHENSIVE METABOLIC PANEL
ALT: 8 U/L (ref 0–53)
AST: 9 U/L (ref 0–37)
Albumin: 4.3 g/dL (ref 3.5–5.2)
Alkaline Phosphatase: 51 U/L (ref 39–117)
BILIRUBIN TOTAL: 2.4 mg/dL — AB (ref 0.2–1.2)
BUN: 24 mg/dL — ABNORMAL HIGH (ref 6–23)
CHLORIDE: 101 meq/L (ref 96–112)
CO2: 29 meq/L (ref 19–32)
Calcium: 9.7 mg/dL (ref 8.4–10.5)
Creatinine, Ser: 0.99 mg/dL (ref 0.40–1.50)
GFR: 79.3 mL/min (ref >60.00)
Glucose, Bld: 146 mg/dL — ABNORMAL HIGH (ref 70–99)
POTASSIUM: 5.1 meq/L (ref 3.5–5.1)
Sodium: 138 mEq/L (ref 135–145)
Total Protein: 7.1 g/dL (ref 6.0–8.3)

## 2017-12-26 LAB — CBC WITH DIFFERENTIAL/PLATELET
BASOS ABS: 0.1 10*3/uL (ref 0.0–0.1)
Basophils Relative: 1.2 % (ref 0.0–3.0)
Eosinophils Absolute: 0.2 10*3/uL (ref 0.0–0.7)
Eosinophils Relative: 2.2 % (ref 0.0–5.0)
HEMATOCRIT: 41.5 % (ref 39.0–52.0)
Hemoglobin: 13.4 g/dL (ref 13.0–17.0)
LYMPHS PCT: 8 % — AB (ref 12.0–46.0)
Lymphs Abs: 0.5 10*3/uL — ABNORMAL LOW (ref 0.7–4.0)
MCHC: 32.4 g/dL (ref 30.0–36.0)
MCV: 86.2 fl (ref 78.0–100.0)
MONOS PCT: 10.3 % (ref 3.0–12.0)
Monocytes Absolute: 0.7 10*3/uL (ref 0.1–1.0)
NEUTROS ABS: 5.4 10*3/uL (ref 1.4–7.7)
Neutrophils Relative %: 78.3 % — ABNORMAL HIGH (ref 43.0–77.0)
PLATELETS: 184 10*3/uL (ref 150.0–400.0)
RBC: 4.82 Mil/uL (ref 4.22–5.81)
RDW: 17.4 % — ABNORMAL HIGH (ref 11.5–15.5)
WBC: 6.9 10*3/uL (ref 4.0–10.5)

## 2017-12-26 LAB — PROTIME-INR
INR: 1.1 ratio — AB (ref 0.8–1.0)
Prothrombin Time: 13.4 s — ABNORMAL HIGH (ref 9.6–13.1)

## 2017-12-26 NOTE — Patient Instructions (Addendum)
No further colon cancer screening is recommended (including stool based tests) due to your age, serious comorbid conditions. You will have labs checked today in the basement lab.  Please head down after you check out with the front desk  (cbc, cmet, inr, AFP). You will be set up for an ultrasound for hepatoma screening.  You have been scheduled for an abdominal ultrasound at [facility] (1st floor of hospital) on 01/01/18 at 9:00am. Please arrive 15 minutes prior to your appointment for registration. Make certain not to have anything to eat or drink 6 hours prior to your appointment. Should you need to reschedule your appointment, please contact radiology at [phone]. This test typically takes about 30 minutes to perform.  Normal BMI (Body Mass Index- based on height and weight) is between 23 and 30. Your BMI today is Body mass index is 24.29 kg/m. Marland Kitchen Please consider follow up  regarding your BMI with your Primary Care Provider.

## 2017-12-26 NOTE — Progress Notes (Signed)
Review of pertinent gastrointestinal problems: 1. Cirrhosis:Originally seen by AE and Dr. Deatra Ina 2015: strong history of alcohol abuse and they suspected that was the cause. Viral serologies were drawn and they were all negative.. He began immunization series for hepatitis A and B. Normal PLTs, normal INR  Most recent imagingUS 04/2017 nodular cirrhosis, no masses, normal PV blood flow  Most recent AFP10/2018 normal  Most recent EGD 09/2016; no signs of portal hypertension; + gastritis, no h pylori (next around 09/2018)  MELD 14 04/2017 (however T bili elation in part from Gilbert's)  Chronically elevated T bili (Gilberts' and cirrhosis mixed)  2. Personal history of adenomatous polyps:Colonoscopy Dr. Deatra Ina 03/2007; several polyps removed; lipomas, HP and a single TA.  Surveillance deferred 2018 due to significant comorbid conditions (home o2, copd, cirrhosis, CHF, afib, defib placed)   HPI: This is a very pleasant 72 year old man whom I last saw 7 or 8 months ago.  He is here today for routine cirrhosis follow-up.  He also wanted to discuss colon cancer screening.   Chief complaint is cirrhosis, colon cancer screening  He has had no new swelling in his legs or his abdomen.  He requires 2 L of nasal cannula oxygen chronically and that need has not increased recently.  No obvious signs of encephalopathy.  No overt GI bleeding.  He is not bothered by his bowels.  No overt GI bleeding and no abdominal pains.  ROS: complete GI ROS as described in HPI, all other review negative.  Constitutional:  No unintentional weight loss   Past Medical History:  Diagnosis Date  . Atrial fibrillation ([place])    AV Node ablation January, 2010, for rapid atrial fib  . Atrial septal defect    Closed with surgery January, 2010  . Automatic implantable cardioverter-defibrillator in situ    LV dysfunction and pacer needed for AV node lesion  . Cardiomyopathy    non-ischemic  . CHF (congestive  heart failure) ([place])   . Chronic combined systolic and diastolic CHF (congestive heart failure) ([place])   . Colon polyps   . COPD (chronic obstructive pulmonary disease) (HCC)    O2- 2 liters, nasal cannula, q night   . COPD GOLD II 01/11/2007   PFT's 09/25/13  FEV1  2.18 (61%) ratio 64 no change p B2 and DLC0  32% corrects to 68% - trial off advair and acei rec starting  08/23/2013     . CVA (cerebral vascular accident) ([place]) 2009   denies residual on 08/14/2013  . Diabetes mellitus without complication ([place])    type 2  . Dyslipidemia   . Dysrhythmia   . Ejection fraction < 50%   . Endocarditis    Bacterial, 2009  . Headache(784.0)    related to stroke only  . HLD (hyperlipidemia)   . Hypertension   . Intracranial hemorrhage (HCC)    Coumadin cannot be used because of the history of his bleed  . Lung cancer ([place]) 11/29/2013   T1N0 Stage Ia non-small cell carcinoma left lung treated with wedge resection  . Mitral regurgitation    Severe symptomatic primary MR due to bacterial endocarditis, treated w/ MVR // Echo 1/18 EF 40-45, diffuse HK, dilated aorta at 42 mm/aortic root 47 mm; linear echogenic structure in ascending aorta - suspect reverberation artifact / consider CT or TEE to rule out dissection flap, bioprosthetic MVR with mean gradient 3, severe LAE, low normal RVSF, severe RAE, moderate TR, PASP 42  . Myocardial infarction ([place]) 2010  .  Pacemaker    combo pacer and icd  . Permanent atrial fibrillation    Originally Coumadin use for atrial fibrillation  //   he had intracerebral hemorrhage with an INR of 2.3 June, 2009. Anticoagulation could no longer be used.  //  Rapid atrial fibrillation after inferior MI October, 2010..........Marland Kitchen AV node ablation done at that time with ICD pacemaker placed (EF 35%).   //   Left atrial appendage tied off at the time of mitral valve surgery January, 2010 (maze pro  . Prosthetic valve dysfunction    Mild mitral stenosis  . Pulmonary hypertension (HCC)     Moderate  . Renal artery stenosis (HCC)    Mild by history  . 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 Spontaneous pneumothorax    right thoracotomy - distant past  . Status post minimally invasive mitral valve replacement with bioprosthetic valve    33 mm Medtronic Mosaic porcine bioprosthesis placed via right mini thoracotomy for bacterial endocarditis complicated by severe MR and CHF   . Thoracic aortic aneurysm ([place]) 08/11/2016   a - Chest CTA 1/18:  Aneurysmal dilatation of aortic root is noted at 5.1 cm.     Past Surgical History:  Procedure Laterality Date  . APPENDECTOMY    . ASD REPAIR, SECUNDUM  07/17/2008   pericardial patch closure of ASD  . AV NODE ABLATION  07/2008   for rapid atrial fib  . CARDIAC CATHETERIZATION    . CARDIAC DEFIBRILLATOR PLACEMENT  ~ [address] [place]  . CARDIAC VALVE REPLACEMENT    . CATARACT EXTRACTION W/ INTRAOCULAR LENS  IMPLANT, BILATERAL Bilateral   . ESOPHAGOGASTRODUODENOSCOPY (EGD) WITH PROPOFOL N/A 09/29/2016   Procedure: ESOPHAGOGASTRODUODENOSCOPY (EGD) WITH PROPOFOL;  Surgeon: [name], MD;  Location: [facility] ENDOSCOPY;  Service: Endoscopy;  Laterality: N/A;  . HERNIA REPAIR    . IMPLANTABLE CARDIOVERTER DEFIBRILLATOR (ICD) GENERATOR CHANGE N/A 02/06/2012   Procedure: ICD GENERATOR CHANGE;  Surgeon: [name], MD;  Location: [facility] CATH LAB;  Service: Cardiovascular;  Laterality: N/A;  . INSERT / REPLACE / REMOVE PACEMAKER    . MASS BIOPSY Left    neck mass  . MITRAL VALVE REPLACEMENT Right 07/17/2008   57mm Medtronic Mosaic porcine bioprosthesis  . PENILE PROSTHESIS IMPLANT    . RIGHT HEART CATHETERIZATION N/A 08/16/2013   Procedure: RIGHT HEART CATH;  Surgeon: Josue Hector, MD;  Location: [facility] CATH LAB;  Service: Cardiovascular;  Laterality: N/A;  . TEE WITHOUT CARDIOVERSION N/A 09/06/2016   Procedure: TRANSESOPHAGEAL ECHOCARDIOGRAM (TEE);  Surgeon: Dorothy Spark, MD;  Location: [facility] ENDOSCOPY;  Service: Cardiovascular;  Laterality: N/A;   . THORACOTOMY Right 1970's   spontaneous pneumothorax - while in the [place]  . TOE SURGERY     left foot hammer toe  . TONSILLECTOMY    . VIDEO ASSISTED THORACOSCOPY (VATS)/WEDGE RESECTION Left 11/29/2013   Procedure: Video assisted thoracoscopy for wedge resection; mini thoracotomy;  Surgeon: [name], MD;  Location: [facility] OR;  Service: Thoracic;  Laterality: Left;    Current Outpatient Medications  Medication Sig Dispense Refill  . acetaminophen (TYLENOL) 500 MG tablet Take 500-1,000 mg by mouth every 6 (six) hours as needed for moderate pain or headache.    Marland Kitchen amoxicillin (AMOXIL) 500 MG capsule Take 2,000 mg by mouth as directed. 1 hour prior to dental procedures    . aspirin EC 81 MG tablet Take 1 tablet (81 mg total) by mouth daily. 90 tablet 3  . atorvastatin (LIPITOR)  10 MG tablet Take 10 mg by mouth at bedtime.    . budesonide-formoterol (SYMBICORT) 160-4.5 MCG/ACT inhaler INHALE 2 PUFFS INTO THE LUNGS TWICE A DAY 30.6 Inhaler 2  . carvedilol (COREG) 3.125 MG tablet TAKE 1 TABLET BY MOUTH TWICE A DAY WITH MEALS 180 tablet 1  . furosemide (LASIX) 40 MG tablet Take 40 mg by mouth daily. May take 1 extra daily as needed for edema    . metFORMIN (GLUCOPHAGE) 1000 MG tablet TAKE 1 TABLET BY MOUTH TWICE A DAY WITH MEALS 180 tablet 1  . nitroGLYCERIN (NITROSTAT) 0.4 MG SL tablet Place 0.4 mg under the tongue every 5 (five) minutes as needed for chest pain (x 3 doses). Reported on 12/29/2015    . OXYGEN Inhale 2 L/min into the lungs continuous.    . valsartan (DIOVAN) 80 MG tablet Take 1 tablet (80 mg total) by mouth daily. 90 tablet 1   No current facility-administered medications for this visit.     Allergies as of 12/26/2017 - Review Complete 12/26/2017  Allergen Reaction Noted  . Anticoagulant compound Other (See Comments) 08/12/2013  . Warfarin sodium Other (See Comments)     Family History  Problem Relation Age of Onset  . Stomach cancer Father   . Stroke Mother      Social History   Socioeconomic History  . Marital status: Married    Spouse name: [name]  . Number of children: 0  . Years of education: College  . Highest education level: Not on file  Occupational History  . 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 Financial resource strain: Not on file  . Food insecurity:    Worry: Not on file    Inability: Not on file  . Transportation needs:    Medical: Not on file    Non-medical: Not on file  Tobacco Use  . Smoking status: Former Smoker    Packs/day: 1.00    Years: 45.00    Pack years: 45.00    Types: Cigarettes    Last attempt to quit: 08/11/2013    Years since quitting: 4.3  . Smokeless tobacco: Never Used  . Tobacco comment: 08/14/2013 "quit smoking in 2009"  Substance and Sexual Activity  . Alcohol use: No    Alcohol/week: 0.0 oz    Comment: 08/14/2013 "used to drink beer; quit:in 1982"  . Drug use: No  . Sexual activity: Yes  Lifestyle  . Physical activity:    Days per week: Not on file    Minutes per session: Not on file  . Stress: Not on file  Relationships  . Social connections:    Talks on phone: Not on file    Gets together: Not on file    Attends religious service: Not on file    Active member of club or organization: Not on file    Attends meetings of clubs or organizations: Not on file    Relationship status: Not on file  . Intimate partner violence:    Fear of current or ex partner: Not on file    Emotionally abused: Not on file    Physically abused: Not on file    Forced sexual activity: Not on file  Other Topics Concern  . Not on file  Social History Narrative   Patient lives at home with his spouse.   Caffeine Use: none   Worked for the post [employer]   Has two boys and a  girl. All live local.      Physical Exam: BP (!) 108/58   Pulse 64   Ht 6\' 1"  (1.854 m)   Wt 184 lb 2 oz (83.5 kg)   BMI 24.29 kg/m  Constitutional: Chronically ill-appearing, nasal  cannula oxygen at his side Psychiatric: alert and oriented x3 Abdomen: soft, nontender, nondistended, no obvious ascites, no peritoneal signs, normal bowel sounds No peripheral edema noted in lower extremities  Assessment and plan: 72 y.o. male with cirrhosis, clinically well compensated.  Routine risk for colon cancer  First his cirrhosis is very well compensated clinically.  His meld score from 2018 is 14 but I think a lot of that is driven by total bilirubin over 3 of which much of it is due to his underlying gilbert's syndrome.   I will restage his liver disease today with blood tests and he will get hepatoma screening with alpha-fetoprotein and ultrasound.  We discussed colon cancer screening and given his serious underlying comorbidities I recommended that he forego any further colon cancer screening including stool based tests.  He and his wife seemed very relieved to hear that  Please see the "Patient Instructions" section for addition details about the plan.  Owens Loffler, MD [facility] 12/26/2017, 10:58 AM

## 2017-12-27 LAB — AFP TUMOR MARKER: AFP-Tumor Marker: 1.7 ng/mL

## 2017-12-28 NOTE — Telephone Encounter (Signed)
I gave the test results to the patient.

## 2017-12-30 ENCOUNTER — Other Ambulatory Visit: Payer: Self-pay | Admitting: Pulmonary Disease

## 2018-01-01 ENCOUNTER — Ambulatory Visit (HOSPITAL_COMMUNITY)
Admission: RE | Admit: 2018-01-01 | Discharge: 2018-01-01 | Disposition: A | Discharge status: Home / Self Care | Payer: Medicare Other | Source: Ambulatory Visit | Attending: Gastroenterology | Admitting: Gastroenterology

## 2018-01-01 DIAGNOSIS — N281 Cyst of kidney, acquired: Secondary | ICD-10-CM | POA: Insufficient documentation

## 2018-01-01 DIAGNOSIS — K7460 Unspecified cirrhosis of liver: Secondary | ICD-10-CM | POA: Insufficient documentation

## 2018-01-03 ENCOUNTER — Encounter: Payer: Self-pay | Admitting: Family Medicine

## 2018-01-03 ENCOUNTER — Ambulatory Visit (INDEPENDENT_AMBULATORY_CARE_PROVIDER_SITE_OTHER): Payer: Medicare Other | Admitting: Family Medicine

## 2018-01-03 VITALS — BP 110/70 | HR 70 | Temp 97.6°F | Resp 12 | Ht 73.0 in | Wt 183.2 lb

## 2018-01-03 DIAGNOSIS — H1012 Acute atopic conjunctivitis, left eye: Secondary | ICD-10-CM | POA: Diagnosis not present

## 2018-01-03 MED ORDER — OLOPATADINE HCL 0.7 % OP SOLN: 1.0000 [drp] | Freq: Every day | OPHTHALMIC | 0 refills | Status: DC

## 2018-01-03 NOTE — Progress Notes (Signed)
ACUTE VISIT   HPI:  Chief Complaint  Patient presents with  . Eye Drainage    watery and itchy eyes started yesterday, possible pink eye    [name] is a 72 y.o. male, who is here today complaining of 1 day of left eye itching and "watery." He has not noted purulent discharge, conjunctival erythema, or changes in vision.  He denies chills, fever, sore throat, nasal congestion, runny nose. He states that the problem seems to be worse at night when he is watching TV but he denies prior history of similar symptoms.  No history of trauma. No known seasonal allergies.  He has not used OTC medication.  No sick contacts or recent travel. Problem seems to be stable.  He has history of DM 2, hypertension, and COPD on supplemental oxygen.  Lab Results  Component Value Date   HGBA1C 6.7 10/20/2017     Review of Systems  Constitutional: Negative for activity change, appetite change, chills, fatigue and fever.  HENT: Negative for mouth sores, postnasal drip, rhinorrhea and sore throat.   Eyes: Positive for discharge and itching. Negative for photophobia, pain, redness and visual disturbance.  Gastrointestinal: Negative for abdominal pain, diarrhea, nausea and vomiting.  Skin: Negative for rash.  Allergic/Immunologic: Negative for environmental allergies.  Neurological: Negative for weakness and headaches.      Current Outpatient Medications on File Prior to Visit  Medication Sig Dispense Refill  . acetaminophen (TYLENOL) 500 MG tablet Take 500-1,000 mg by mouth every 6 (six) hours as needed for moderate pain or headache.    Marland Kitchen amoxicillin (AMOXIL) 500 MG capsule Take 2,000 mg by mouth as directed. 1 hour prior to dental procedures    . aspirin EC 81 MG tablet Take 1 tablet (81 mg total) by mouth daily. 90 tablet 3  . atorvastatin (LIPITOR) 10 MG tablet Take 10 mg by mouth at bedtime.    . budesonide-formoterol (SYMBICORT) 160-4.5 MCG/ACT inhaler INHALE 2 PUFFS  INTO THE LUNGS TWICE A DAY 10.2 Inhaler 0  . carvedilol (COREG) 3.125 MG tablet TAKE 1 TABLET BY MOUTH TWICE A DAY WITH MEALS 180 tablet 1  . furosemide (LASIX) 40 MG tablet Take 40 mg by mouth daily. May take 1 extra daily as needed for edema    . metFORMIN (GLUCOPHAGE) 1000 MG tablet TAKE 1 TABLET BY MOUTH TWICE A DAY WITH MEALS 180 tablet 1  . nitroGLYCERIN (NITROSTAT) 0.4 MG SL tablet Place 0.4 mg under the tongue every 5 (five) minutes as needed for chest pain (x 3 doses). Reported on 12/29/2015    . OXYGEN Inhale 2 L/min into the lungs continuous.    . valsartan (DIOVAN) 80 MG tablet Take 1 tablet (80 mg total) by mouth daily. 90 tablet 1   No current facility-administered medications on file prior to visit.      Past Medical History:  Diagnosis Date  . Atrial fibrillation ([place])    AV Node ablation January, 2010, for rapid atrial fib  . Atrial septal defect    Closed with surgery January, 2010  . Automatic implantable cardioverter-defibrillator in situ    LV dysfunction and pacer needed for AV node lesion  . Cardiomyopathy    non-ischemic  . CHF (congestive heart failure) ([place])   . Chronic combined systolic and diastolic CHF (congestive heart failure) ([place])   . Colon polyps   . COPD (chronic obstructive pulmonary disease) (HCC)    O2- 2 liters, nasal cannula, q night   .  COPD GOLD II 01/11/2007   PFT's 09/25/13  FEV1  2.18 (61%) ratio 64 no change p B2 and DLC0  32% corrects to 68% - trial off advair and acei rec starting  08/23/2013     . CVA (cerebral vascular accident) ([place]) 2009   denies residual on 08/14/2013  . Diabetes mellitus without complication ([place])    type 2  . Dyslipidemia   . Dysrhythmia   . Ejection fraction < 50%   . Endocarditis    Bacterial, 2009  . Headache(784.0)    related to stroke only  . HLD (hyperlipidemia)   . Hypertension   . Intracranial hemorrhage (HCC)    Coumadin cannot be used because of the history of his bleed  . Lung cancer ([place])  11/29/2013   T1N0 Stage Ia non-small cell carcinoma left lung treated with wedge resection  . Mitral regurgitation    Severe symptomatic primary MR due to bacterial endocarditis, treated w/ MVR // Echo 1/18 EF 40-45, diffuse HK, dilated aorta at 42 mm/aortic root 47 mm; linear echogenic structure in ascending aorta - suspect reverberation artifact / consider CT or TEE to rule out dissection flap, bioprosthetic MVR with mean gradient 3, severe LAE, low normal RVSF, severe RAE, moderate TR, PASP 42  . Myocardial infarction ([place]) 2010  . Pacemaker    combo pacer and icd  . Permanent atrial fibrillation    Originally Coumadin use for atrial fibrillation  //   he had intracerebral hemorrhage with an INR of 2.3 June, 2009. Anticoagulation could no longer be used.  //  Rapid atrial fibrillation after inferior MI October, 2010..........Marland Kitchen AV node ablation done at that time with ICD pacemaker placed (EF 35%).   //   Left atrial appendage tied off at the time of mitral valve surgery January, 2010 (maze pro  . Prosthetic valve dysfunction    Mild mitral stenosis  . Pulmonary hypertension (HCC)    Moderate  . Renal artery stenosis (HCC)    Mild by history  . 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 Spontaneous pneumothorax    right thoracotomy - distant past  . Status post minimally invasive mitral valve replacement with bioprosthetic valve    33 mm Medtronic Mosaic porcine bioprosthesis placed via right mini thoracotomy for bacterial endocarditis complicated by severe MR and CHF   . Thoracic aortic aneurysm ([place]) 08/11/2016   a - Chest CTA 1/18:  Aneurysmal dilatation of aortic root is noted at 5.1 cm.    Allergies  Allergen Reactions  . Anticoagulant Compound Other (See Comments)    Pt had intracranial bleed, therefore all anticoagulation is contra-indicated per Dr. Ron Parker.  . Warfarin Sodium Other (See Comments)    Pt had intracranial bleed, therefore all anticoagulation is contra-indicated per Dr.  Ron Parker.     Social History   Socioeconomic History  . Marital status: Married    Spouse name: [name]  . Number of children: 0  . Years of education: College  . Highest education level: Not on file  Occupational History  . 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 Financial resource strain: Not on file  . Food insecurity:    Worry: Not on file    Inability: Not on file  . Transportation needs:    Medical: Not on file    Non-medical: Not on file  Tobacco Use  . Smoking status: Former Smoker    Packs/day: 1.00  Years: 45.00    Pack years: 45.00    Types: Cigarettes    Last attempt to quit: 08/11/2013    Years since quitting: 4.4  . Smokeless tobacco: Never Used  . Tobacco comment: 08/14/2013 "quit smoking in 2009"  Substance and Sexual Activity  . Alcohol use: No    Alcohol/week: 0.0 oz    Comment: 08/14/2013 "used to drink beer; quit:in 1982"  . Drug use: No  . Sexual activity: Yes  Lifestyle  . Physical activity:    Days per week: Not on file    Minutes per session: Not on file  . Stress: Not on file  Relationships  . Social connections:    Talks on phone: Not on file    Gets together: Not on file    Attends religious service: Not on file    Active member of club or organization: Not on file    Attends meetings of clubs or organizations: Not on file    Relationship status: Not on file  Other Topics Concern  . Not on file  Social History Narrative   Patient lives at home with his spouse.   Caffeine Use: none   Worked for the post office   Has two boys and a girl. All live local.     Vitals:   01/03/18 1025  BP: 110/70  Pulse: 70  Resp: 12  Temp: 97.6 F (36.4 C)  SpO2: 94%   Body mass index is 24.18 kg/m.   Physical Exam  Nursing note and vitals reviewed. Constitutional: He is oriented to person, place, and time. He appears well-developed and well-nourished. He does not appear ill. No distress.  HENT:   Head: Normocephalic and atraumatic.  Mouth/Throat: Oropharynx is clear and moist and mucous membranes are normal.  Eyes: Pupils are equal, round, and reactive to light. Conjunctivae and EOM are normal. Right eye exhibits no discharge. Left eye exhibits no discharge.  Mild tarsal conjunctival erythema lower eyelid, left eye. There is no conjunctival erythema, photophobia, or purulent drainage.  Respiratory: Effort normal and breath sounds normal. No respiratory distress.  On supplemental O2  Lymphadenopathy:       Head (right side): No preauricular and no posterior auricular adenopathy present.       Head (left side): No preauricular and no posterior auricular adenopathy present.    He has no cervical adenopathy.  Neurological: He is alert and oriented to person, place, and time. He has normal strength.  Skin: Skin is warm. No rash noted. No erythema.  Psychiatric: He has a normal mood and affect.  Well groomed, good eye contact.      ASSESSMENT AND PLAN:   Mr. Hussien was seen today for eye drainage.  Diagnoses and all orders for this visit:  Allergic conjunctivitis of left eye -     Olopatadine HCl (PAZEO) 0.7 % SOLN; Apply 1 drop to eye daily.   We discussed possible etiologies, clinical examination does not suggest bacterial conjunctivitis. Recommend topical antihistaminic. He was clearly instructed about warning signs. Recommend following up with PCP if symptoms are not resolved in 1 to 2 weeks, before if symptoms get worse. She voices understanding and agrees with plan.   Return if symptoms worsen or fail to improve.     Betty G. Martinique, MD  [facility]. [place] office.

## 2018-01-03 NOTE — Patient Instructions (Signed)
  [name] I have seen you today for an acute visit.  A few things to remember from today's visit:   Allergic conjunctivitis of left eye - Plan: Olopatadine HCl (PAZEO) 0.7 % SOLN   Medications prescribed today are intended for short period of time and will not be refill upon request, a follow up appointment might be necessary to discuss continuation of of treatment if appropriate.    Allergic Conjunctivitis A clear membrane (conjunctiva) covers the white part of your eye and the inner surface of your eyelid. Allergic conjunctivitis happens when this membrane has inflammation. This is caused by allergies. Common causes of allergic reactions (allergens)include:  Outdoor allergens, such as: ? Pollen. ? Grass and weeds. ? Mold spores.  Indoor allergens, such as: ? Dust. ? Smoke. ? Mold. ? Pet dander. ? Animal hair.  This condition can make your eye red or pink. It can also make your eye feel itchy. This condition cannot be spread from one person to another person (is not contagious). Follow these instructions at home:  Try not to be around things that you are allergic to.  Take or apply over-the-counter and prescription medicines only as told by your doctor. These include any eye drops.  Place a cool, clean washcloth on your eye for 10-20 minutes. Do this 3-4 times a day.  Do not touch or rub your eyes.  Do not wear contact lenses until the inflammation is gone. Wear glasses instead.  Do not wear eye makeup until the inflammation is gone.  Keep all follow-up visits as told by your doctor. This is important. Contact a doctor if:  Your symptoms get worse.  Your symptoms do not get better with treatment.  You have mild eye pain.  You are sensitive to light,  You have spots or blisters on your eyes.  You have pus coming from your eye.  You have a fever. Get help right away if:  You have redness, swelling, or other symptoms in only one eye.  Your vision  is blurry.  You have vision changes.  You have very bad eye pain. Summary  Allergic conjunctivitis is caused by allergies. It can make your eye red or pink, and it can make your eye feel itchy.  This condition cannot be spread from one person to another person (is not contagious).  Try not to be around things that you are allergic to.  Take or apply over-the-counter and prescription medicines only as told by your doctor. These include any eye drops.  Contact your doctor if your symptoms get worse or they do not get better with treatment. This information is not intended to replace advice given to you by your health care provider. Make sure you discuss any questions you have with your health care provider. Document Released: 12/15/2009 Document Revised: 02/19/2016 Document Reviewed: 02/19/2016 Elsevier Interactive Patient Education  2017 [employer].   In general please monitor for signs of worsening symptoms and seek immediate medical attention if any concerning.  If symptoms are not resolved in 1-2 weeks you should schedule a follow up appointment with your doctor, before if needed.  I hope you get better soon!

## 2018-02-07 IMAGING — US US ABDOMEN COMPLETE
1 series · 13 of 25 positions shown · non-contrast
Comparison: Abdominal ultrasound June 25, 2015

CLINICAL DATA: Hepatoma screening, history of cirrhosis, diabetes,
lung malignancy.

EXAM:
ABDOMEN ULTRASOUND COMPLETE

[Series 1: us abdomen complete · 0.20mm/px · 13 of 131 slices shown]
[im 1/131]
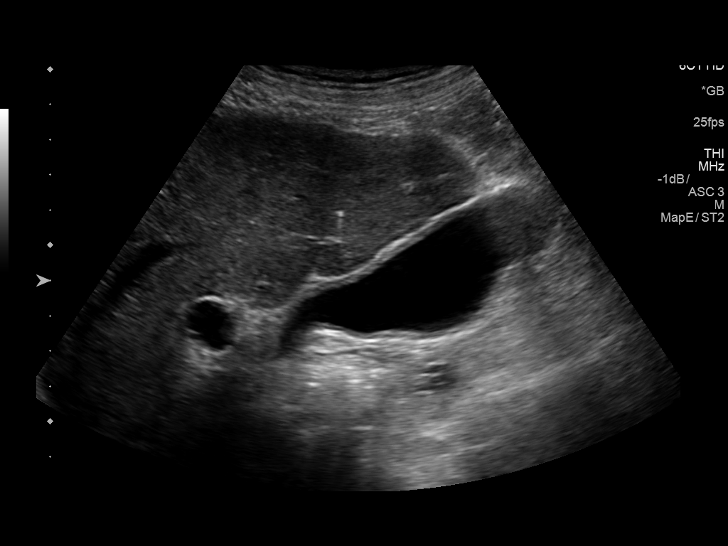
[im 11/131]
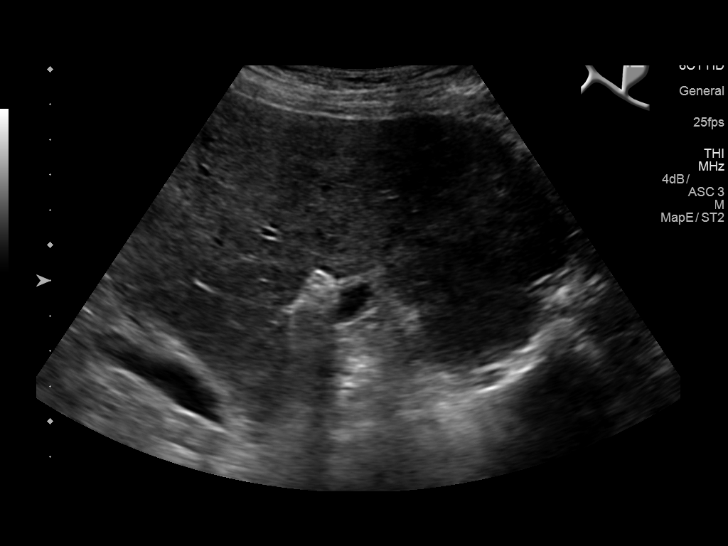
[im 22/131]
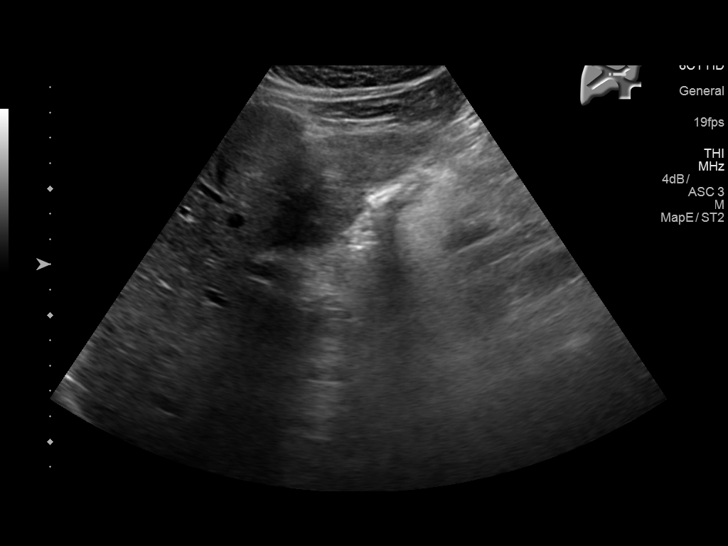
[im 33/131]
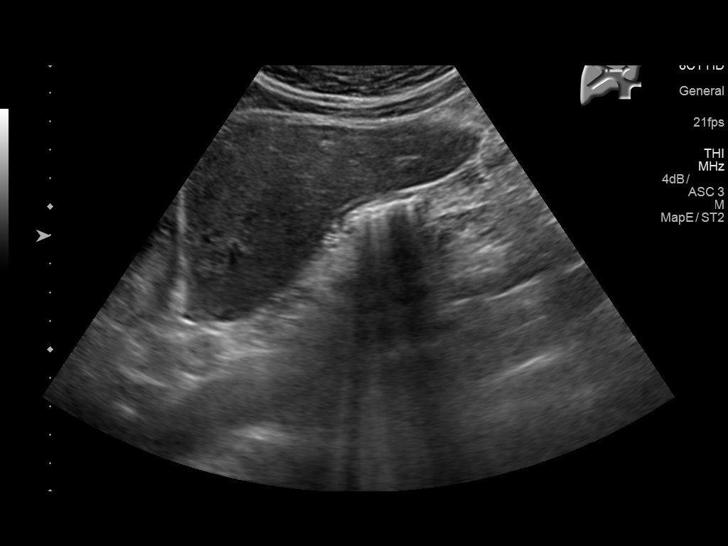
[im 44/131]
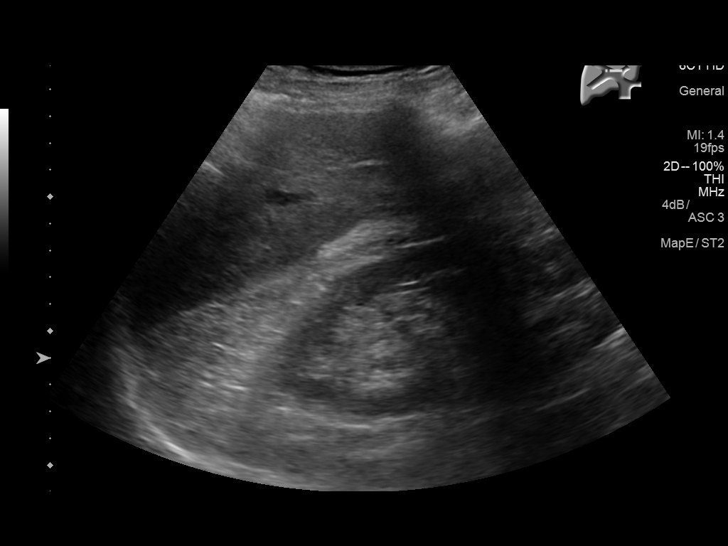
[im 55/131]
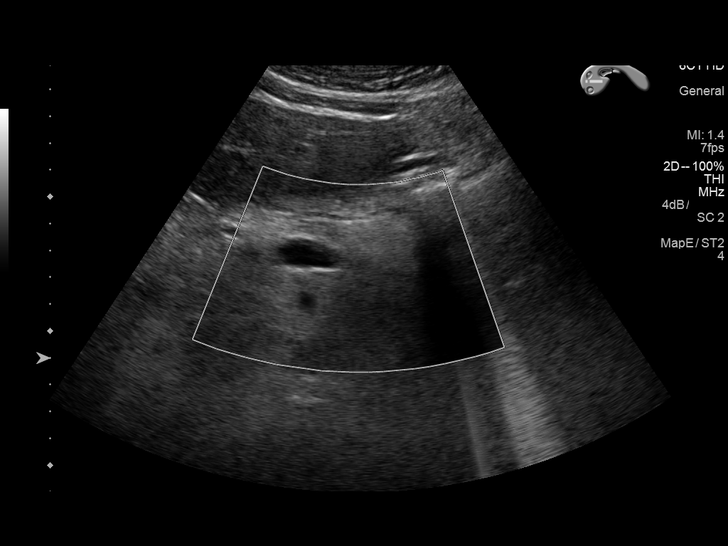
[im 66/131]
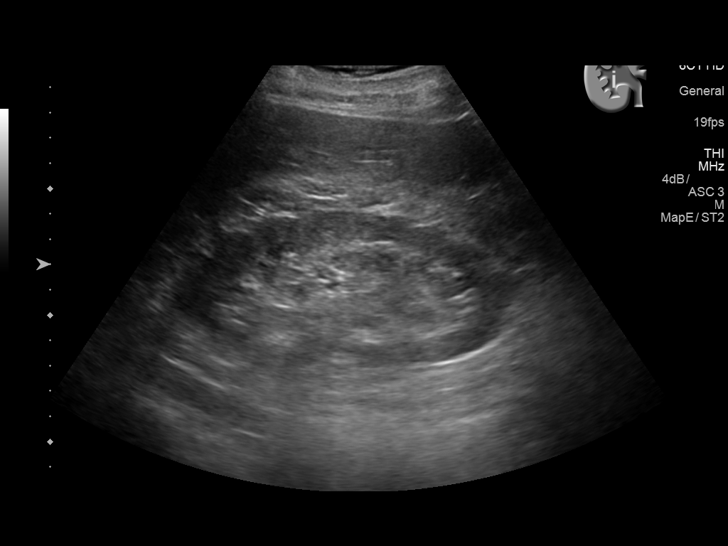
[im 76/131]
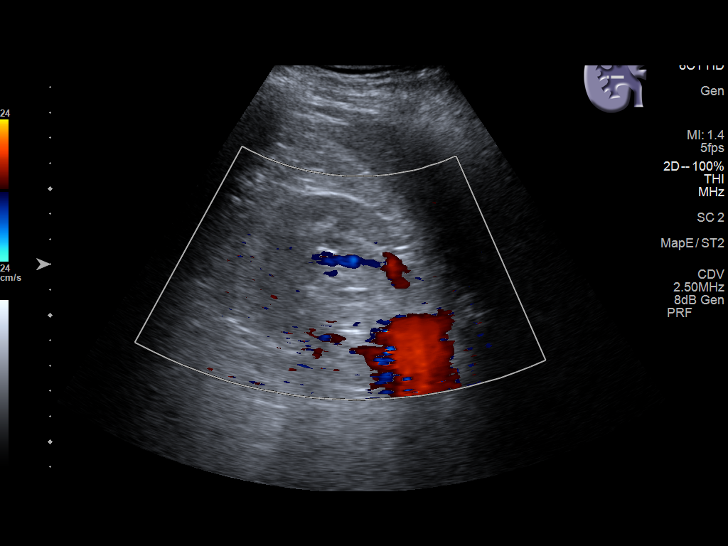
[im 87/131]
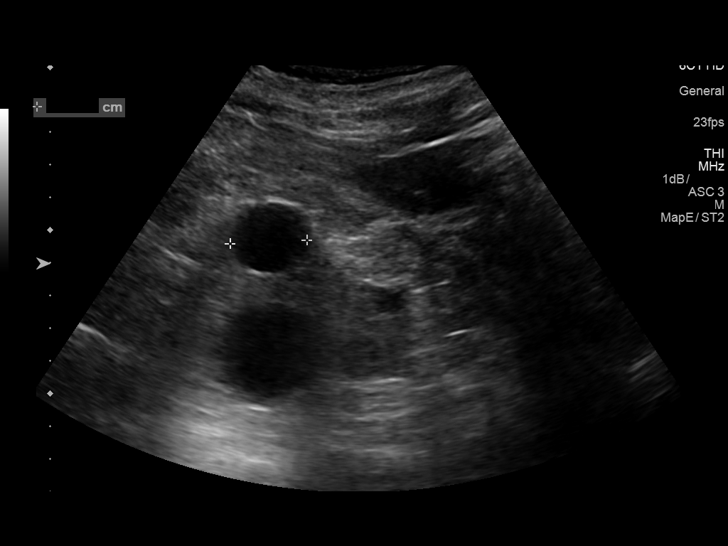
[im 98/131]
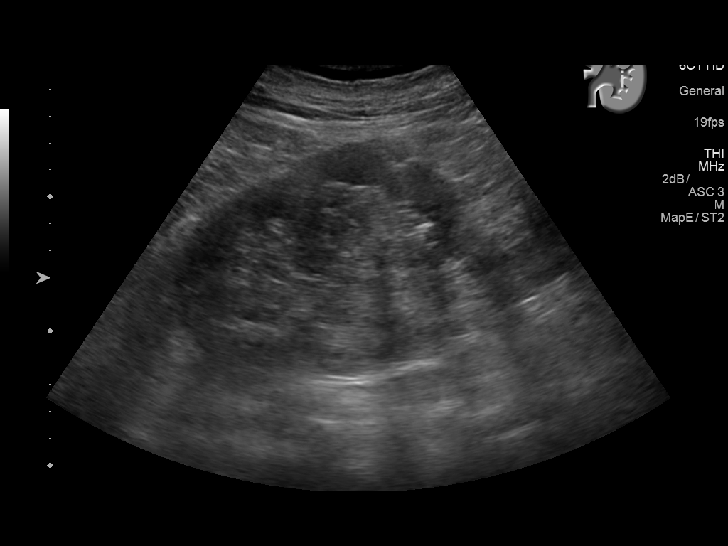
[im 109/131]
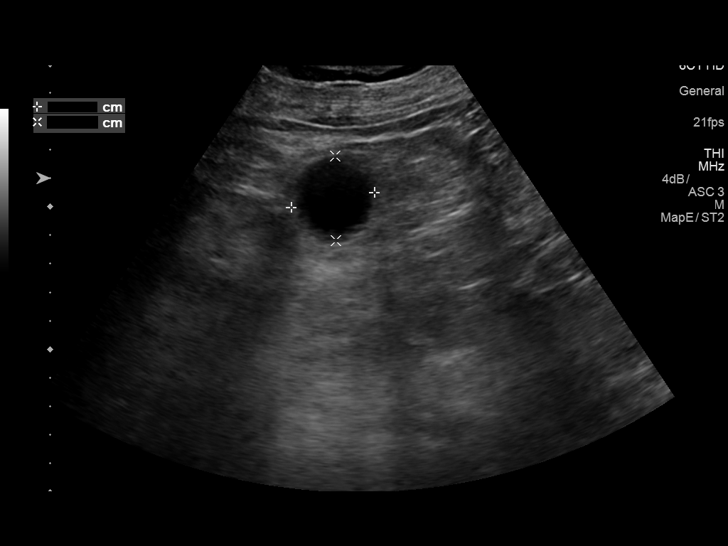
[im 120/131]
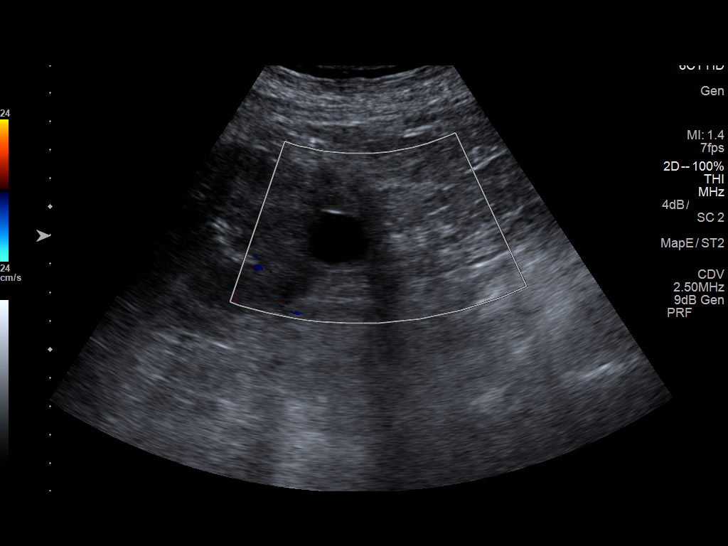
[im 131/131]
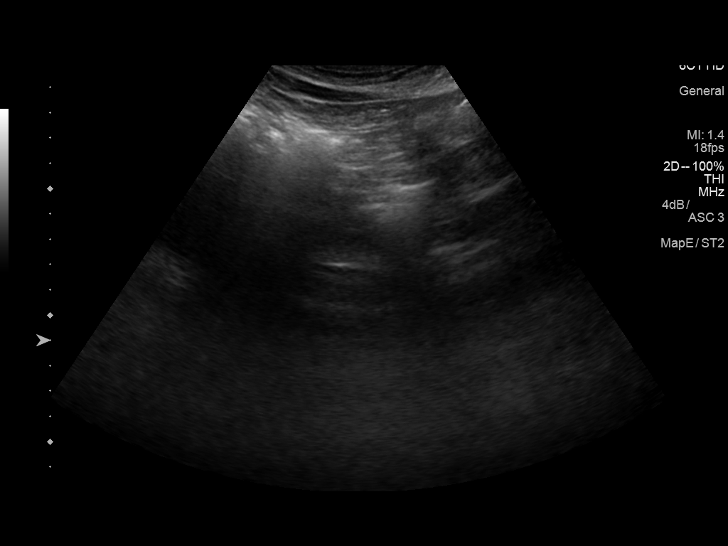

[13 of 25 positions shown; findings below may reference images not displayed]

FINDINGS: Gallbladder: The gallbladder is adequately distended. The
gallbladder wall is mildly prominent though stable. No stones or
pericholecystic fluid collections are observed. There is no positive
sonographic Murphy's sign.

Common bile duct: Diameter: 1.8 mm

Liver: The hepatic echotexture is heterogeneous. The surface contour
is mildly irregular. No discrete hepatic mass is observed. There is
no intrahepatic ductal dilation.

IVC: No abnormality visualized.

Pancreas: Bowel gas limits evaluation of the pancreatic head and
tail. The pancreatic body is grossly normal.

Spleen: The spleen measures 11.7 cm in length. There is a focal 2
hyperechoic focus within the spleen that suggests scarring given its
contour.

Right Kidney: Length: 13.8 cm. The renal cortical echotexture is
greater than that of the liver. There is mild diffuse cortical
thinning. There are cysts in the midpole measuring up to 3.1 cm in
diameter. There is no hydronephrosis.

Left Kidney: Length: 14.0. The renal cortical echotexture is
increased similar to that on the right. There are cysts present
measuring up to 3 cm in diameter. There is no hydronephrosis.

Abdominal aorta: Limited visualization of the abdominal aorta. No
definite aneurysm is observed. Next item no ascites is demonstrated.

Other findings: Night struck
IMPRESSION: 1. No gallstones or sonographic evidence of acute cholecystitis.
Mild chronic thickening of the gallbladder wall measuring up to
mm.
2. Cirrhotic changes within the liver. No abnormal mass is observed.
The spleen is top-normal in size. The visualized portions of the
pancreas are normal.
3. Simple appearing bilateral renal cysts. Increased renal cortical
echotexture bilaterally is consistent with medical renal disease.

## 2018-02-22 ENCOUNTER — Other Ambulatory Visit: Payer: Self-pay | Admitting: Pulmonary Disease

## 2018-02-23 NOTE — Telephone Encounter (Signed)
One refill only.  Needs to be seen at office

## 2018-02-28 ENCOUNTER — Ambulatory Visit (INDEPENDENT_AMBULATORY_CARE_PROVIDER_SITE_OTHER): Payer: Medicare Other | Admitting: *Deleted

## 2018-02-28 DIAGNOSIS — I428 Other cardiomyopathies: Secondary | ICD-10-CM

## 2018-02-28 DIAGNOSIS — I5022 Chronic systolic (congestive) heart failure: Secondary | ICD-10-CM

## 2018-02-28 NOTE — Progress Notes (Signed)
Remote ICD transmission.   

## 2018-03-02 ENCOUNTER — Encounter: Payer: Self-pay | Admitting: Cardiology

## 2018-03-12 IMAGING — CT CT ANGIO CHEST
2 of 8 series · 18 of 46 positions shown · IV contrast (isovue)
Comparison: CT scan of December 25, 2015.

CLINICAL DATA: Possible aortic dissection.

EXAM:
CT ANGIOGRAPHY CHEST WITH CONTRAST
TECHNIQUE: Multidetector CT imaging of the chest was performed using the
standard protocol during bolus administration of intravenous
contrast. Multiplanar CT image reconstructions and MIPs were
obtained to evaluate the vascular anatomy.
CONTRAST:  100 mL of Isovue 370 intravenously.

[Series 6: dissection 3.0 i31f 2 · axial · 0.77mm/px · z∈[-399,-66]mm · 15 of 127 slices shown]
[im 8/127  lung]
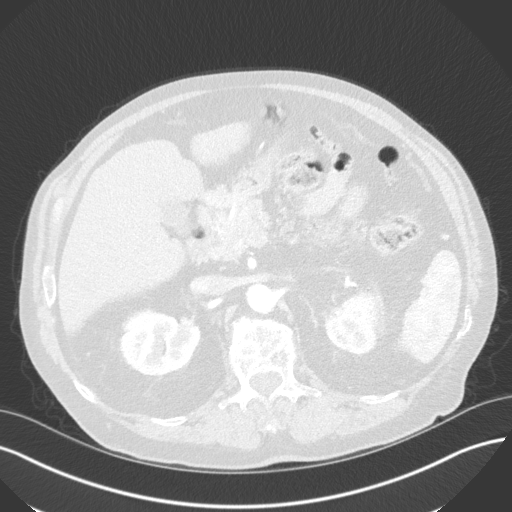
[im 15/127  soft-tissue]
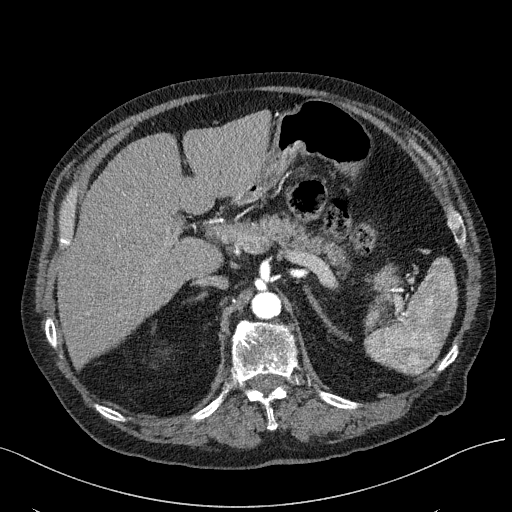
[im 23/127  lung]
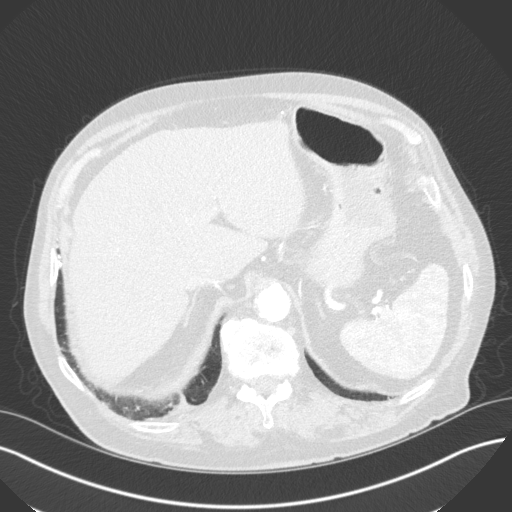
[im 30/127  soft-tissue]
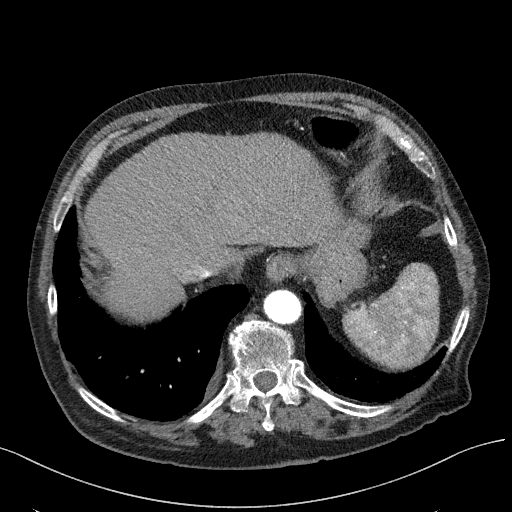
[im 38/127  lung]
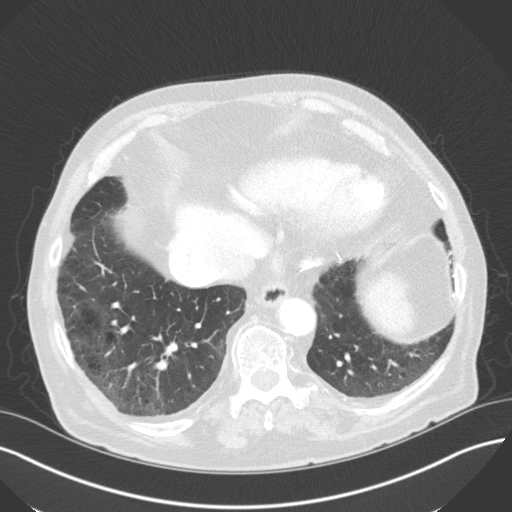
[im 45/127  soft-tissue]
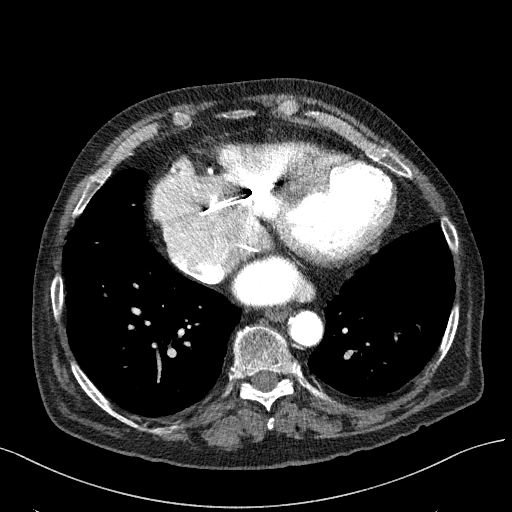
[im 52/127  lung]
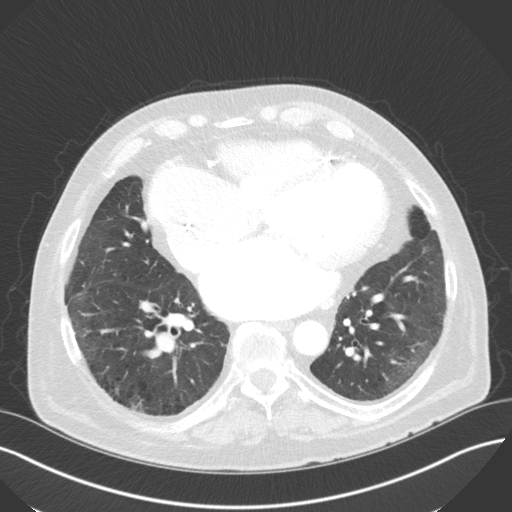
[im 67/127  soft-tissue]
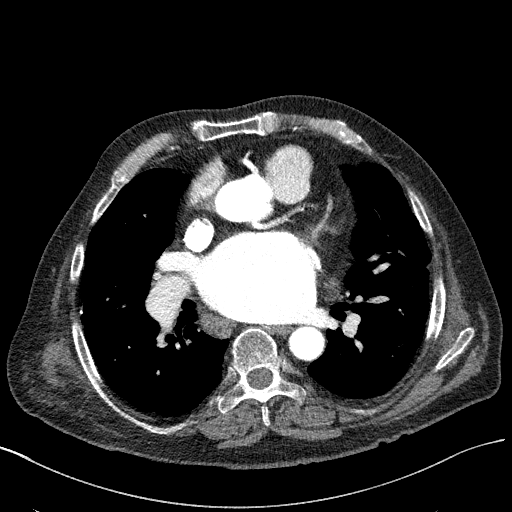
[im 75/127  lung]
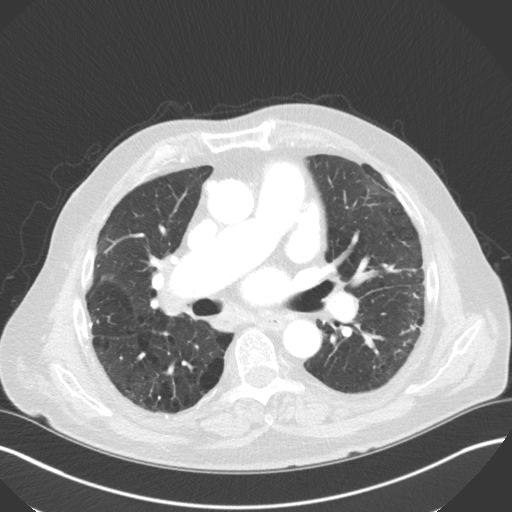
[im 82/127  soft-tissue]
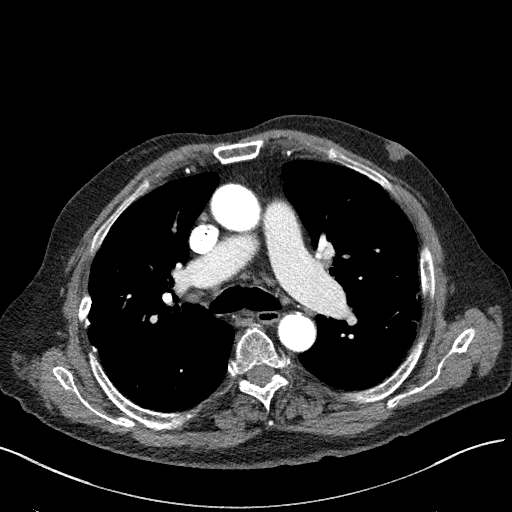
[im 89/127  lung]
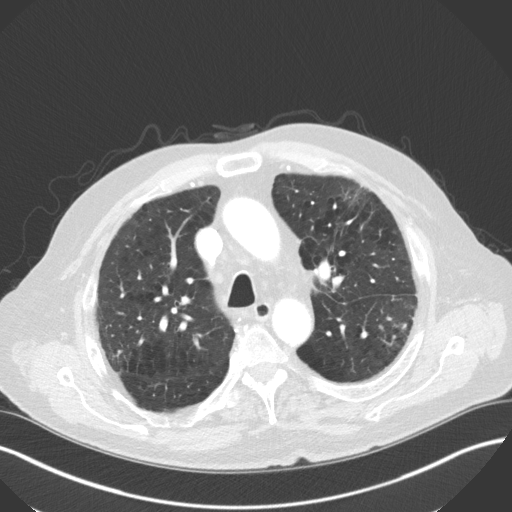
[im 97/127  soft-tissue]
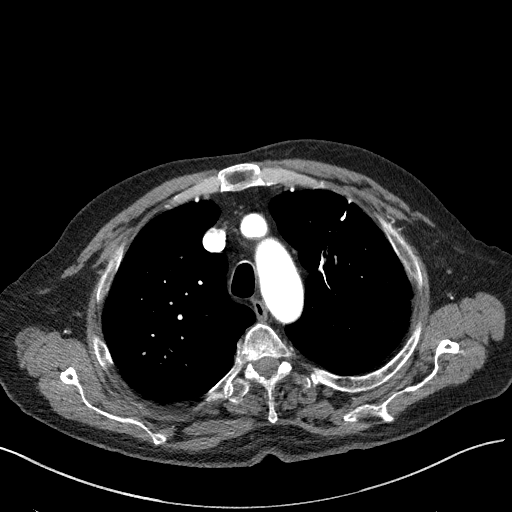
[im 104/127  lung]
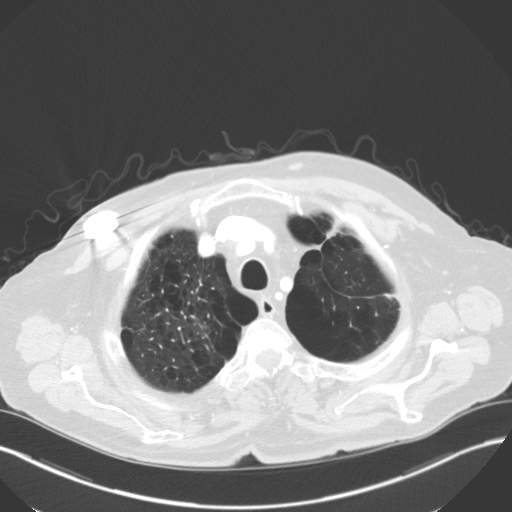
[im 112/127  soft-tissue]
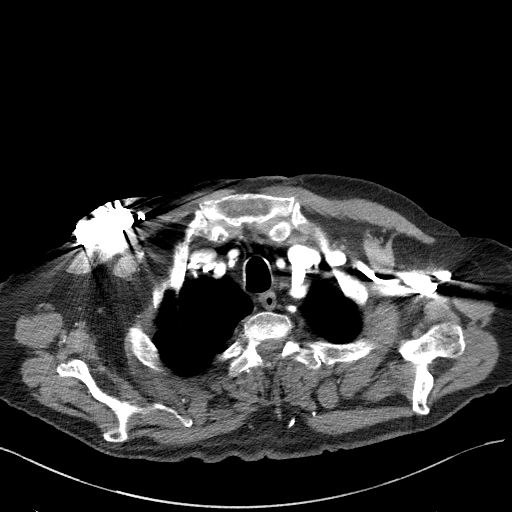
[im 119/127  lung]
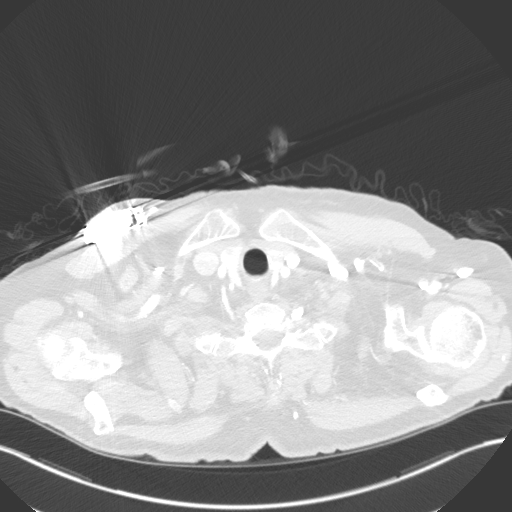

[Series 9: coronals · coronal · 0.73mm/px · 3 of 135 slices shown]
[im 34/135  soft-tissue]
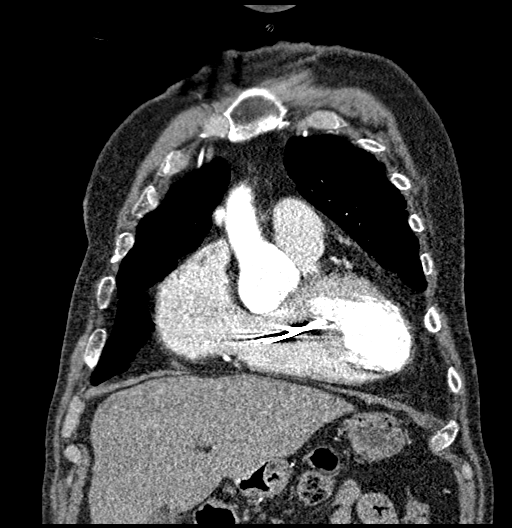
[im 68/135  soft-tissue]
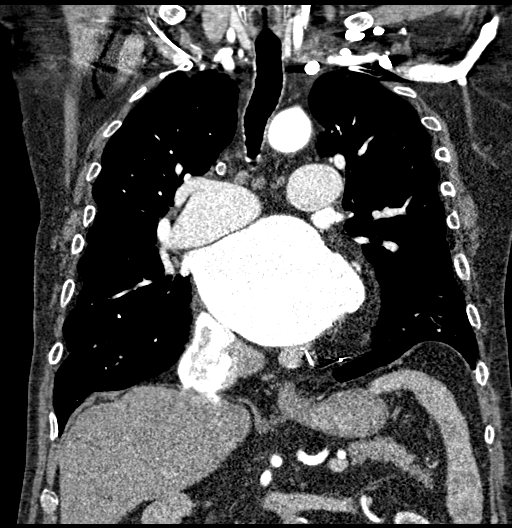
[im 101/135  soft-tissue]
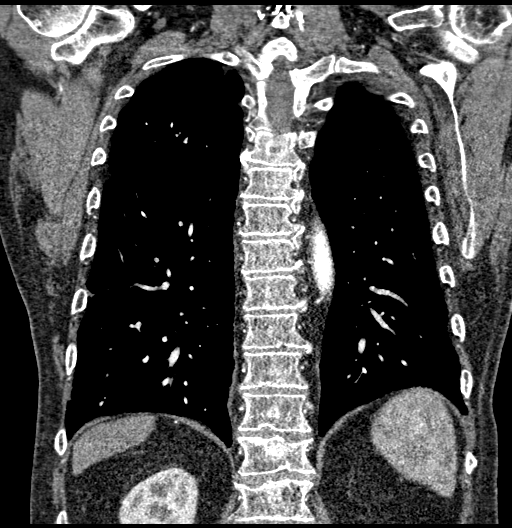

[18 of 46 positions shown; findings below may reference images not displayed]

FINDINGS: Cardiovascular: Aneurysmal dilatation of aortic root is noted
measured at 5.1 cm. The remaining portion of the thoracic aorta is
normal in caliber. There is no evidence of thoracic aortic
dissection. Great vessels are widely patent without significant
stenosis. Atherosclerosis of descending thoracic aorta is noted.
Mild coronary artery calcifications are noted. Right-sided pacemaker
is in grossly good position. Enlargement of pulmonary arteries is
noted suggesting pulmonary artery hypertension. Enlargement of left
atrium is noted suggesting mitral valve disorder.

Mediastinum/Nodes: Stable mildly enlarged mediastinal adenopathy is
noted which most likely is inflammatory or reactive in etiology.

Lungs/Pleura: No pneumothorax or pleural effusion is noted.
Emphysematous disease is noted in the upper lobes bilaterally.

Upper Abdomen: No acute abnormality.

Musculoskeletal: No chest wall abnormality. No acute or significant
osseous findings.

Review of the MIP images confirms the above findings.
IMPRESSION: Aneurysmal dilatation of aortic root is noted at 5.1 cm. There is no
evidence of thoracic aortic dissection. Atherosclerosis of thoracic
aorta is noted. Recommend semi-annual imaging followup by CTA or MRA
and referral to cardiothoracic surgery if not already obtained. This
recommendation follows 8747
ACCF/AHA/AATS/ACR/ASA/SCA/SANTHI/URI/DOL/LIMAN Guidelines for the
Diagnosis and Management of Patients With Thoracic Aortic Disease.
Circulation. 8747; 121: [id number].

Mild coronary artery calcifications are noted suggesting coronary
artery disease.

Stable mildly enlarged mediastinal adenopathy is noted which most
likely is inflammatory or reactive in etiology.

Enlarged pulmonary arteries are noted suggesting pulmonary artery
hypertension.

Stable enlargement of left atrium is noted.

## 2018-03-14 ENCOUNTER — Encounter: Payer: Self-pay | Admitting: Internal Medicine

## 2018-03-14 ENCOUNTER — Ambulatory Visit (INDEPENDENT_AMBULATORY_CARE_PROVIDER_SITE_OTHER): Payer: Medicare Other | Admitting: Internal Medicine

## 2018-03-14 VITALS — BP 104/68 | HR 70 | Ht 73.0 in | Wt 178.8 lb

## 2018-03-14 DIAGNOSIS — I5022 Chronic systolic (congestive) heart failure: Secondary | ICD-10-CM

## 2018-03-14 DIAGNOSIS — I482 Chronic atrial fibrillation: Secondary | ICD-10-CM | POA: Diagnosis not present

## 2018-03-14 DIAGNOSIS — Z95810 Presence of automatic (implantable) cardiac defibrillator: Secondary | ICD-10-CM | POA: Diagnosis not present

## 2018-03-14 DIAGNOSIS — I4821 Permanent atrial fibrillation: Secondary | ICD-10-CM

## 2018-03-14 NOTE — Progress Notes (Signed)
HPI [name] returns today for followup of his ICD. He is a very pleasant 72 year old man with multiple medical problems including chronic systolic heart failure, status post biventricular ICD insertion, chronic atrial fibrillation with a rapid ventricular response status post AV node ablation, mitral valve replacement, COPD on home oxygen, and cirrhosis. In the interim, he has been using home oxygen therapy. He has had no ICD shocks. He denies peripheral edema. No anginal symptoms. He has not been in the hospital since his last clinic visit.  Allergies  Allergen Reactions  . Anticoagulant Compound Other (See Comments)    Pt had intracranial bleed, therefore all anticoagulation is contra-indicated per Dr. Ron Parker.  . Warfarin Sodium Other (See Comments)    Pt had intracranial bleed, therefore all anticoagulation is contra-indicated per Dr. Ron Parker.      Current Outpatient Medications  Medication Sig Dispense Refill  . acetaminophen (TYLENOL) 500 MG tablet Take 500-1,000 mg by mouth every 6 (six) hours as needed for moderate pain or headache.    Marland Kitchen amoxicillin (AMOXIL) 500 MG capsule Take 2,000 mg by mouth as directed. 1 hour prior to dental procedures    . aspirin EC 81 MG tablet Take 1 tablet (81 mg total) by mouth daily. 90 tablet 3  . atorvastatin (LIPITOR) 10 MG tablet Take 10 mg by mouth at bedtime.    . budesonide-formoterol (SYMBICORT) 160-4.5 MCG/ACT inhaler INHALE 2 PUFFS INTO THE LUNGS TWICE A DAY 10.2 Inhaler 0  . carvedilol (COREG) 3.125 MG tablet TAKE 1 TABLET BY MOUTH TWICE A DAY WITH MEALS 180 tablet 1  . furosemide (LASIX) 40 MG tablet Take 40 mg by mouth daily. May take 1 extra daily as needed for edema    . metFORMIN (GLUCOPHAGE) 1000 MG tablet TAKE 1 TABLET BY MOUTH TWICE A DAY WITH MEALS 180 tablet 1  . nitroGLYCERIN (NITROSTAT) 0.4 MG SL tablet Place 0.4 mg under the tongue every 5 (five) minutes as needed for chest pain (x 3 doses). Reported on 12/29/2015    .  Olopatadine HCl (PAZEO) 0.7 % SOLN Apply 1 drop to eye daily. 1 Bottle 0  . OXYGEN Inhale 2 L/min into the lungs continuous.    . valsartan (DIOVAN) 80 MG tablet Take 1 tablet (80 mg total) by mouth daily. 90 tablet 1   No current facility-administered medications for this visit.      Past Medical History:  Diagnosis Date  . Atrial fibrillation ([place])    AV Node ablation January, 2010, for rapid atrial fib  . Atrial septal defect    Closed with surgery January, 2010  . Automatic implantable cardioverter-defibrillator in situ    LV dysfunction and pacer needed for AV node lesion  . Cardiomyopathy    non-ischemic  . CHF (congestive heart failure) ([place])   . Chronic combined systolic and diastolic CHF (congestive heart failure) ([place])   . Colon polyps   . COPD (chronic obstructive pulmonary disease) (H[facility])    O2- 2 liters, nasal cannula, q night   . COPD GOLD II 01/11/2007   PFT's 09/25/13  FEV1  2.18 (61%) ratio 64 no change p B2 and DLC0  32% corrects to 68% - trial off advair and acei rec starting  08/23/2013     . CVA (cerebral vascular accident) ([place]) 2009   denies residual on 08/14/2013  . Diabetes mellitus without complication ([place])    type 2  . Dyslipidemia   . Dysrhythmia   . Ejection fraction <  50%   . Endocarditis    Bacterial, 2009  . Headache(784.0)    related to stroke only  . HLD (hyperlipidemia)   . Hypertension   . Intracranial hemorrhage ([facility])    Coumadin cannot be used because of the history of his bleed  . Lung cancer ([place]) 11/29/2013   T1N0 Stage Ia non-small cell carcinoma left lung treated with wedge resection  . Mitral regurgitation    Severe symptomatic primary MR due to bacterial endocarditis, treated w/ MVR // Echo 1/18 EF 40-45, diffuse HK, dilated aorta at 42 mm/aortic root 47 mm; linear echogenic structure in ascending aorta - suspect reverberation artifact / consider CT or TEE to rule out dissection flap, bioprosthetic MVR with mean gradient 3, severe LAE,  low normal RVSF, severe RAE, moderate TR, PASP 42  . Myocardial infarction ([place]) 2010  . Pacemaker    combo pacer and icd  . Permanent atrial fibrillation    Originally Coumadin use for atrial fibrillation  //   he had intracerebral hemorrhage with an INR of 2.3 June, 2009. Anticoagulation could no longer be used.  //  Rapid atrial fibrillation after inferior MI October, 2010..........Marland Kitchen AV node ablation done at that time with ICD pacemaker placed (EF 35%).   //   Left atrial appendage tied off at the time of mitral valve surgery January, 2010 (maze pro  . Prosthetic valve dysfunction    Mild mitral stenosis  . Pulmonary hypertension (HCC)    Moderate  . Renal artery stenosis (H[facility])    Mild by history  . 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 Spontaneous pneumothorax    right thoracotomy - distant past  . Status post minimally invasive mitral valve replacement with bioprosthetic valve    33 mm Medtronic Mosaic porcine bioprosthesis placed via right mini thoracotomy for bacterial endocarditis complicated by severe MR and CHF   . Thoracic aortic aneurysm ([place]) 08/11/2016   a - Chest CTA 1/18:  Aneurysmal dilatation of aortic root is noted at 5.1 cm.     ROS:   All systems reviewed and negative except as noted in the HPI.   Past Surgical History:  Procedure Laterality Date  . APPENDECTOMY    . ASD REPAIR, SECUNDUM  07/17/2008   pericardial patch closure of ASD  . AV NODE ABLATION  07/2008   for rapid atrial fib  . CARDIAC CATHETERIZATION    . CARDIAC DEFIBRILLATOR PLACEMENT  ~ [address] Jude  . CARDIAC VALVE REPLACEMENT    . CATARACT EXTRACTION W/ INTRAOCULAR LENS  IMPLANT, BILATERAL Bilateral   . ESOPHAGOGASTRODUODENOSCOPY (EGD) WITH PROPOFOL N/A 09/29/2016   Procedure: ESOPHAGOGASTRODUODENOSCOPY (EGD) WITH PROPOFOL;  Surgeon: [name], MD;  Location: [facility] END[facility]COPY;  Service: Endoscopy;  Laterality: N/A;  . HERNIA REPAIR    . IMPLANTABLE CARDIOVERTER DEFIBRILLATOR (ICD)  GENERATOR CHANGE N/A 02/06/2012   Procedure: ICD GENERATOR CHANGE;  Surgeon: [name], MD;  Location[facility] C[facility] LAB;  Service: Cardiovascular;  Laterality: N/A;  . INSERT / REPLACE / REMOVE PACEMAKER    . MASS BIOPSY Left    neck mass  . MITRAL VALVE REPLACEMENT Right 07/17/2008   67mm Medtronic Mosaic porcine bioprosthesis  . PENILE PROSTHESIS IMPLANT    . RIGHT HEART CATHETERIZATION N/A 08/16/2013   Procedure: RIGHT HEART CATH;  Surgeon: Josue Hector, MD;  Location[facility];  Service: Cardiovascular;  Laterality: N/A;  . TEE WITHOUT CARDIOVERSION N/A 09/06/2016   Procedure: TRANSESOPHAGEAL ECHOCARDIOGRAM (TEE);  Surgeon:  Dorothy Spark, MD;  Location: [place];  Service: Cardiovascular;  Laterality: N/A;  . THORACOTOMY Right 1970's   spontaneous pneumothorax - while in the [place]  . TOE SURGERY     left foot hammer toe  . TONSILLECTOMY    . VIDEO ASSISTED THORACOSCOPY (VATS)/WEDGE RESECTION Left 11/29/2013   Procedure: Video assisted thoracoscopy for wedge resection; mini thoracotomy;  Surgeon: [name], MD;  Location: [facility] OR;  Service: Thoracic;  Laterality: Left;     Family History  Problem Relation Age of Onset  . Stomach cancer Father   . Stroke Mother      Social History   Socioeconomic History  . Marital status: Married    Spouse name: [name]  . Number of children: 0  . Years of education: College  . Highest education level: Not on file  Occupational History  . 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 Financial resource strain: Not on file  . Food insecurity:    Worry: Not on file    Inability: Not on file  . Transportation needs:    Medical: Not on file    Non-medical: Not on file  Tobacco Use  . Smoking status: Former Smoker    Packs/day: 1.00    Years: 45.00    Pack years: 45.00    Types: Cigarettes    Last attempt to quit: 08/11/2013    Years since quitting: 4.5  . Smokeless tobacco: Never  Used  . Tobacco comment: 08/14/2013 "quit smoking in 2009"  Substance and Sexual Activity  . Alcohol use: No    Alcohol/week: 0.0 standard drinks    Comment: 08/14/2013 "used to drink beer; quit:in 1982"  . Drug use: No  . Sexual activity: Yes  Lifestyle  . Physical activity:    Days per week: Not on file    Minutes per session: Not on file  . Stress: Not on file  Relationships  . Social connections:    Talks on phone: Not on file    Gets together: Not on file    Attends religious service: Not on file    Active member of club or organization: Not on file    Attends meetings of clubs or organizations: Not on file    Relationship status: Not on file  . Intimate partner violence:    Fear of current or ex partner: Not on file    Emotionally abused: Not on file    Physically abused: Not on file    Forced sexual activity: Not on file  Other Topics Concern  . Not on file  Social History Narrative   Patient lives at home with his spouse.   Caffeine Use: none   Worked for the post office   Has two boys and a girl. All live local.      BP 104/68   Pulse 70   Ht 6\' 1"  (1.854 m)   Wt 178 lb 12.8 oz (81.1 kg)   SpO2 96%   BMI 23.59 kg/m   Physical Exam:  Well appearing NAD HEENT: Unremarkable Neck:  No JVD, no thyromegally Lymphatics:  No adenopathy Back:  No CVA tenderness Lungs:  Clear HEART:  Regular rate rhythm, no murmurs, no rubs, no clicks Abd:  soft, positive bowel sounds, no organomegally, no rebound, no guarding Ext:  2 plus pulses, no edema, no cyanosis, no clubbing Skin:  No rashes no nodules Neuro:  CN II through XII  intact, motor grossly intact  EKG - atrial fib/flutter with biv pacing  DEVICE  Normal device function.  See PaceArt for details.   Assess/Plan: 1. Chronic atrial fib - his ventricular rate is well controlled. We will continue his current meds. 2. Chronic systolic heart failure - his symptoms are class 2. He will continue medical therapy.  3.  ICD - his [employer] Jude Biv ICD is working normally and has about 2.5 years on the battery.  Mikle [email or web address].D.

## 2018-03-14 NOTE — Patient Instructions (Signed)
Medication Instructions:  Your physician recommends that you continue on your current medications as directed. Please refer to the Current Medication list given to you today.  Labwork: None ordered.  Testing/Procedures: None ordered.  Follow-Up: Your physician wants you to follow-up in: one year with Dr. Lovena Le.   You will receive a reminder letter in the mail two months in advance. If you don't receive a letter, please call our office to schedule the follow-up appointment.  Remote monitoring is used to monitor your ICD from home. This monitoring reduces the number of office visits required to check your device to one time per year. It allows [place] to keep an eye on the functioning of your device to ensure it is working properly. You are scheduled for a device check from home on 05/30/2018. You may send your transmission at any time that day. If you have a wireless device, the transmission will be sent automatically. After your physician reviews your transmission, you will receive a postcard with your next transmission date.  Any Other Special Instructions Will Be Listed Below (If Applicable).  If you need a refill on your cardiac medications before your next appointment, please call your pharmacy.

## 2018-03-19 ENCOUNTER — Other Ambulatory Visit: Payer: Self-pay | Admitting: Pulmonary Disease

## 2018-03-26 NOTE — Progress Notes (Signed)
CARDIOLOGY OFFICE NOTE  Date:  03/27/2018    [name] Date of Birth: 08-22-1945 Medical Record #[id number]  PCP:  [name], NP  Cardiologist:  Servando Snare & Skains/Taylor    Chief Complaint  Patient presents with  . Congestive Heart Failure    6 month check - seen for Dr. Larwance Sachs    History of Present Illness: [name] is a 72 y.o. male who presents today for a follow up visit. Seen for Dr. Marlou Porch & Lovena Le.   He has a hx of mitral regurgitation due toendocarditis s/p bioprosthetic MVR, permanent AF, intracerebral hemorrhage in 2009 (anticoagulation stopped), systolic CHF, NICM with EF 35%, inferior MI in 2009, prior CVA, COPD on chronic O2, Lung CA s/p wedge resection - followed by Dr. Earlie Server with oncology, pulmonary HTN (Dr. Elsworth Soho). He underwent AV node ablation due uncontrolled atrial fibrillation in 2010 with placement of BiV-ICD. He also underwent LA appendage clipping at the time of his MV surgery. LHC prior to MVR demonstrated normal coronaries (etiology of MI in 2009 unknown).   Admitted in 3/17 for near syncope related to orthostatic hypotension. 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 He has declined surgical intervention.   Saw Dr. Lovena Le about 2 weeks ago for his device check/visit - was felt to be doing well. Has about 2.5 years of battery life. Last seen by Dr. Marlou Porch in March and was felt to be stable at that time as well.   Comes in today. Here alone. He says he is doing ok. Denies chest pain. Remains on oxygen. Says he has been working out in the yard - picking up sticks. Not dizzy or lightheaded. BP is soft but does not seem symptomatic. Having his physical later this month with PCP. He has no real concerns.    Past Medical History:  Diagnosis Date  . Atrial fibrillation ([place])    AV Node ablation January, 2010, for  rapid atrial fib  . Atrial septal defect    Closed with surgery January, 2010  . Automatic implantable cardioverter-defibrillator in situ    LV dysfunction and pacer needed for AV node lesion  . Cardiomyopathy    non-ischemic  . CHF (congestive heart failure) ([place])   . Chronic combined systolic and diastolic CHF (congestive heart failure) ([place])   . Colon polyps   . COPD (chronic obstructive pulmonary disease) (HCC)    O2- 2 liters, nasal cannula, q night   . COPD GOLD II 01/11/2007   PFT's 09/25/13  FEV1  2.18 (61%) ratio 64 no change p B2 and DLC0  32% corrects to 68% - trial off advair and acei rec starting  08/23/2013     . CVA (cerebral vascular accident) ([place]) 2009   denies residual on 08/14/2013  . Diabetes mellitus without complication ([place])    type 2  . Dyslipidemia   . Dysrhythmia   . Ejection fraction < 50%   . Endocarditis    Bacterial, 2009  . Headache(784.0)    related to stroke only  . HLD (hyperlipidemia)   . Hypertension   . Intracranial hemorrhage (HCC)    Coumadin cannot be used because of the history of his bleed  . Lung cancer ([place]) 11/29/2013   T1N0 Stage Ia non-small cell carcinoma left lung treated with wedge resection  . Mitral regurgitation    Severe  symptomatic primary MR due to bacterial endocarditis, treated w/ MVR // Echo 1/18 EF 40-45, diffuse HK, dilated aorta at 42 mm/aortic root 47 mm; linear echogenic structure in ascending aorta - suspect reverberation artifact / consider CT or TEE to rule out dissection flap, bioprosthetic MVR with mean gradient 3, severe LAE, low normal RVSF, severe RAE, moderate TR, PASP 42  . Myocardial infarction ([place]) 2010  . Pacemaker    combo pacer and icd  . Permanent atrial fibrillation    Originally Coumadin use for atrial fibrillation  //   he had intracerebral hemorrhage with an INR of 2.3 June, 2009. Anticoagulation could no longer be used.  //  Rapid atrial fibrillation after inferior MI October, 2010..........Marland Kitchen AV node  ablation done at that time with ICD pacemaker placed (EF 35%).   //   Left atrial appendage tied off at the time of mitral valve surgery January, 2010 (maze pro  . Prosthetic valve dysfunction    Mild mitral stenosis  . Pulmonary hypertension (HCC)    Moderate  . Renal artery stenosis (HCC)    Mild by history  . 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 Spontaneous pneumothorax    right thoracotomy - distant past  . Status post minimally invasive mitral valve replacement with bioprosthetic valve    33 mm Medtronic Mosaic porcine bioprosthesis placed via right mini thoracotomy for bacterial endocarditis complicated by severe MR and CHF   . Thoracic aortic aneurysm ([place]) 08/11/2016   a - Chest CTA 1/18:  Aneurysmal dilatation of aortic root is noted at 5.1 cm.     Past Surgical History:  Procedure Laterality Date  . APPENDECTOMY    . ASD REPAIR, SECUNDUM  07/17/2008   pericardial patch closure of ASD  . AV NODE ABLATION  07/2008   for rapid atrial fib  . CARDIAC CATHETERIZATION    . CARDIAC DEFIBRILLATOR PLACEMENT  ~ [address] [employer]  . CARDIAC VALVE REPLACEMENT    . CATARACT EXTRACTION W/ INTRAOCULAR LENS  IMPLANT, BILATERAL Bilateral   . ESOPHAGOGASTRODUODENOSCOPY (EGD) WITH PROPOFOL N/A 09/29/2016   Procedure: ESOPHAGOGASTRODUODENOSCOPY (EGD) WITH PROPOFOL;  Surgeon: [name], MD;  Location: [facility] ENDOSCOPY;  Service: Endoscopy;  Laterality: N/A;  . HERNIA REPAIR    . IMPLANTABLE CARDIOVERTER DEFIBRILLATOR (ICD) GENERATOR CHANGE N/A 02/06/2012   Procedure: ICD GENERATOR CHANGE;  Surgeon: [name], MD;  Location[facility] CATH LAB;  Service: Cardiovascular;  Laterality: N/A;  . INSERT / REPLACE / REMOVE PACEMAKER    . MASS BIOPSY Left    neck mass  . MITRAL VALVE REPLACEMENT Right 07/17/2008   27mm Medtronic Mosaic porcine bioprosthesis  . PENILE PROSTHESIS IMPLANT    . RIGHT HEART CATHETERIZATION N/A 08/16/2013   Procedure: RIGHT HEART CATH;  Surgeon: Josue Hector, MD;  Location[facility]  CATH LAB;  Service: Cardiovascular;  Laterality: N/A;  . TEE WITHOUT CARDIOVERSION N/A 09/06/2016   Procedure: TRANSESOPHAGEAL ECHOCARDIOGRAM (TEE);  Surgeon: Dorothy Spark, MD;  Location: [facility] ENDOSCOPY;  Service: Cardiovascular;  Laterality: N/A;  . THORACOTOMY Right 1970's   spontaneous pneumothorax - while in the [place]  . TOE SURGERY     left foot hammer toe  . TONSILLECTOMY    . VIDEO ASSISTED THORACOSCOPY (VATS)/WEDGE RESECTION Left 11/29/2013   Procedure: Video assisted thoracoscopy for wedge resection; mini thoracotomy;  Surgeon: [name], MD;  Location: [facility] OR;  Service: Thoracic;  Laterality: Left;     Medications: Current Meds  Medication Sig  .  acetaminophen (TYLENOL) 500 MG tablet Take 500-1,000 mg by mouth every 6 (six) hours as needed for moderate pain or headache.  Marland Kitchen amoxicillin (AMOXIL) 500 MG capsule Take 2,000 mg by mouth as directed. 1 hour prior to dental procedures  . aspirin EC 81 MG tablet Take 1 tablet (81 mg total) by mouth daily.  Marland Kitchen atorvastatin (LIPITOR) 10 MG tablet Take 10 mg by mouth at bedtime.  . budesonide-formoterol (SYMBICORT) 160-4.5 MCG/ACT inhaler INHALE 2 PUFFS INTO THE LUNGS TWICE A DAY  . carvedilol (COREG) 3.125 MG tablet TAKE 1 TABLET BY MOUTH TWICE A DAY WITH MEALS  . furosemide (LASIX) 40 MG tablet Take 40 mg by mouth daily. May take 1 extra daily as needed for edema  . metFORMIN (GLUCOPHAGE) 1000 MG tablet TAKE 1 TABLET BY MOUTH TWICE A DAY WITH MEALS  . nitroGLYCERIN (NITROSTAT) 0.4 MG SL tablet Place 0.4 mg under the tongue every 5 (five) minutes as needed for chest pain (x 3 doses). Reported on 12/29/2015  . Olopatadine HCl (PAZEO) 0.7 % SOLN Apply 1 drop to eye daily.  . OXYGEN Inhale 2 L/min into the lungs continuous.  . valsartan (DIOVAN) 80 MG tablet Take 1 tablet (80 mg total) by mouth daily.     Allergies: Allergies  Allergen Reactions  . Anticoagulant Compound Other (See Comments)    Pt had intracranial bleed,  therefore all anticoagulation is contra-indicated per Dr. Ron Parker.  . Warfarin Sodium Other (See Comments)    Pt had intracranial bleed, therefore all anticoagulation is contra-indicated per Dr. Ron Parker.     Social History: The patient  reports that he quit smoking about 4 years ago. His smoking use included cigarettes. He has a 45.00 pack-year smoking history. He has never used smokeless tobacco. He reports that he does not drink alcohol or use drugs.   Family History: The patient's family history includes Stomach cancer in his father; Stroke in his mother.   Review of Systems: Please see the history of present illness.   Otherwise, the review of systems is positive for none.   All other systems are reviewed and negative.   Physical Exam: VS:  BP (!) 100/50 (BP Location: Left Arm, Patient Position: Sitting, Cuff Size: Normal)   Pulse 74   Ht 6\' 2"  (1.88 m)   Wt 180 lb 1.9 oz (81.7 kg)   SpO2 96%   BMI 23.13 kg/m  .  BMI Body mass index is 23.13 kg/m.  Wt Readings from Last 3 Encounters:  03/27/18 180 lb 1.9 oz (81.7 kg)  03/14/18 178 lb 12.8 oz (81.1 kg)  01/03/18 183 lb 4 oz (83.1 kg)    General: Pleasant. Chronically ill appearing - alert and in no acute distress.   HEENT: Normal.  Neck: Supple, no JVD, carotid bruits, or masses noted.  Cardiac: Fairly regular - presumed paced. No edema. Lots of varicosities.  Respiratory:  Lungs are clear to auscultation bilaterally with normal work of breathing. He has oxygen in place.  GI: Soft and nontender.  MS: No deformity or atrophy. Gait and ROM intact.  Skin: Warm and dry. Color is normal.  Neuro:  Strength and sensation are intact and no gross focal deficits noted.  Psych: Alert, appropriate and with normal affect.   LABORATORY DATA:  EKG:  EKG is not ordered today.  Lab Results  Component Value Date   WBC 6.9 12/26/2017   HGB 13.4 12/26/2017   HCT 41.5 12/26/2017   PLT 184.0 12/26/2017   GLUCOSE 146 (  H) 12/26/2017   CHOL  93 04/27/2017   TRIG 119.0 04/27/2017   HDL 43.50 04/27/2017   LDLDIRECT 80.2 03/31/2011   LDLCALC 25 04/27/2017   ALT 8 12/26/2017   AST 9 12/26/2017   NA 138 12/26/2017   K 5.1 12/26/2017   CL 101 12/26/2017   CREATININE 0.99 12/26/2017   BUN 24 (H) 12/26/2017   CO2 29 12/26/2017   TSH 0.592 09/24/2015   PSA 0.94 07/01/2013   INR 1.1 (H) 12/26/2017   HGBA1C 6.7 10/20/2017   MICROALBUR 3.2 (H) 01/22/2015     BNP (last 3 results) No results for input(s): BNP in the last 8760 hours.  ProBNP (last 3 results) No results for input(s): PROBNP in the last 8760 hours.   Other Studies Reviewed Today:  ECHO 09/06/16: - Left ventricle: Systolic function was mildly reduced. The estimated ejection fraction was in the range of 45% to 50%. Wall motion was normal; there were no regional wall motion abnormalities. - Mitral valve: S/P MVR with bioprosthetic valve with 33 mm Medtronic Mosaic porcine bioprosthesis. There is no central mitral regurgitation or paravalvular leak. There is limited motion of the posterior leaflet. Mean transmitral gradient is 7 mmHg. Valve area by pressure half-time: 1.26 cm^2. - Left atrium: The atrium was massively dilated. No evidence of thrombus in the atrial cavity or appendage but heavy smoke seen in the left atrium. The appendage was multilobulated and large. Emptying velocity was decreased. - Right atrium: The atrium was dilated. No evidence of thrombus in the atrial cavity or appendage.  Impressions:  - Aortic valve leaflets are structurally normal, with normal coaptation and no regurgitation. 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 The pseudoaneurysm measures 32 x 25 mm. The communication is seen on both 2D (image 21, 22) and 3D images (image 26).  S/P MVR with bioprosthetic valve with 33 mm Medtronic Mosaic porcine bioprosthesis. There is no  central mitral regurgitation or paravalvular leak. There is limited motion of the posterior leaflet however anterior leaflet opens very well. Mean transmitral gradient is 7 mmHg that is most probably attributed to prosthesis/patient mismatch. There is no significant stenosis.   Assessment/Plan:   1. Permanent AF - has device in place - not a candidate for anticoagulation. Rate is ok.   2. CM/Chronic systolic HF - looks fairly compensated. No changes made.   3. COPD - on oxygen.   4. Prior intracranial hemorrhage  5. History of lung cancer  6. Prior MVR  7. Prior AV nodal ablation with BiV/ICD in place - saw EP about 2 weeks ago - device check ok.    Current medicines are reviewed with the patient today.  The patient does not have concerns regarding medicines other than what has been noted above.  The following changes have been made:  See above.  Labs/ tests ordered today include:   No orders of the defined types were placed in this encounter.    Disposition:   FU with Dr. Marlou Porch in 6 months. Seeing Dr. Lovena Le again in one year.   Patient is agreeable to this plan and will call if any problems develop in the interim.   SignedTruitt Merle, NP  03/27/2018 9:23 AM  [facility] [address] [place], [place]  [address] Phone: [phone] Fax: [phone]

## 2018-03-27 ENCOUNTER — Encounter: Payer: Self-pay | Admitting: Nurse Practitioner

## 2018-03-27 ENCOUNTER — Ambulatory Visit (INDEPENDENT_AMBULATORY_CARE_PROVIDER_SITE_OTHER): Payer: Medicare Other | Admitting: Nurse Practitioner

## 2018-03-27 VITALS — BP 100/50 | HR 74 | Ht 74.0 in | Wt 180.1 lb

## 2018-03-27 DIAGNOSIS — I5042 Chronic combined systolic (congestive) and diastolic (congestive) heart failure: Secondary | ICD-10-CM

## 2018-03-27 DIAGNOSIS — Z95810 Presence of automatic (implantable) cardiac defibrillator: Secondary | ICD-10-CM

## 2018-03-27 DIAGNOSIS — I5022 Chronic systolic (congestive) heart failure: Secondary | ICD-10-CM

## 2018-03-27 DIAGNOSIS — I482 Chronic atrial fibrillation: Secondary | ICD-10-CM | POA: Diagnosis not present

## 2018-03-27 DIAGNOSIS — I428 Other cardiomyopathies: Secondary | ICD-10-CM | POA: Diagnosis not present

## 2018-03-27 DIAGNOSIS — I4821 Permanent atrial fibrillation: Secondary | ICD-10-CM

## 2018-03-27 NOTE — Patient Instructions (Addendum)
We will be checking the following labs today - NONE   Medication Instructions:    Continue with your current medicines.     Testing/Procedures To Be Arranged:  N/A  Follow-Up:   See Dr. Marlou Porch in 6 months    Other Special Instructions:   N/A    If you need a refill on your cardiac medications before your next appointment, please call your pharmacy.   Call the [place] office at [phone] if you have any questions, problems or concerns.

## 2018-04-03 LAB — CUP PACEART REMOTE DEVICE CHECK
Battery Remaining Longevity: 31 mo
Battery Remaining Percentage: 34 %
HIGH POWER IMPEDANCE MEASURED VALUE: 66 Ohm
HIGH POWER IMPEDANCE MEASURED VALUE: 66 Ohm
Implantable Lead Implant Date: 20100113
Implantable Lead Location: 753858
Implantable Lead Model: 7122
Lead Channel Impedance Value: 380 Ohm
Lead Channel Pacing Threshold Amplitude: 0.625 V
Lead Channel Pacing Threshold Pulse Width: 0.5 ms
Lead Channel Pacing Threshold Pulse Width: 0.5 ms
Lead Channel Sensing Intrinsic Amplitude: 11.9 mV
Lead Channel Setting Pacing Amplitude: 2 V
Lead Channel Setting Pacing Amplitude: 2.125
Lead Channel Setting Pacing Pulse Width: 0.5 ms
Lead Channel Setting Pacing Pulse Width: 0.5 ms
Lead Channel Setting Sensing Sensitivity: 0.5 mV
MDC IDC LEAD IMPLANT DT: 20100113
MDC IDC LEAD LOCATION: 753860
MDC IDC MSMT BATTERY VOLTAGE: 2.89 V
MDC IDC MSMT LEADCHNL LV IMPEDANCE VALUE: 990 Ohm
MDC IDC MSMT LEADCHNL LV PACING THRESHOLD AMPLITUDE: 1.125 V
MDC IDC PG IMPLANT DT: 20130729
MDC IDC SESS DTM: 20190821080017
Pulse Gen Serial Number: 7053988

## 2018-04-06 ENCOUNTER — Ambulatory Visit (INDEPENDENT_AMBULATORY_CARE_PROVIDER_SITE_OTHER): Payer: Medicare Other | Admitting: Adult Health

## 2018-04-06 ENCOUNTER — Encounter: Payer: Self-pay | Admitting: Adult Health

## 2018-04-06 VITALS — BP 100/48 | HR 73 | Temp 98.2°F | Ht 72.0 in | Wt 180.6 lb

## 2018-04-06 DIAGNOSIS — J449 Chronic obstructive pulmonary disease, unspecified: Secondary | ICD-10-CM

## 2018-04-06 DIAGNOSIS — I482 Chronic atrial fibrillation: Secondary | ICD-10-CM | POA: Diagnosis not present

## 2018-04-06 DIAGNOSIS — I10 Essential (primary) hypertension: Secondary | ICD-10-CM

## 2018-04-06 DIAGNOSIS — E118 Type 2 diabetes mellitus with unspecified complications: Secondary | ICD-10-CM | POA: Diagnosis not present

## 2018-04-06 DIAGNOSIS — E785 Hyperlipidemia, unspecified: Secondary | ICD-10-CM

## 2018-04-06 DIAGNOSIS — Z23 Encounter for immunization: Secondary | ICD-10-CM | POA: Diagnosis not present

## 2018-04-06 DIAGNOSIS — I5022 Chronic systolic (congestive) heart failure: Secondary | ICD-10-CM | POA: Diagnosis not present

## 2018-04-06 DIAGNOSIS — I4821 Permanent atrial fibrillation: Secondary | ICD-10-CM

## 2018-04-06 LAB — CBC WITH DIFFERENTIAL/PLATELET
BASOS PCT: 1.3 % (ref 0.0–3.0)
Basophils Absolute: 0.1 10*3/uL (ref 0.0–0.1)
EOS ABS: 0.2 10*3/uL (ref 0.0–0.7)
EOS PCT: 2.8 % (ref 0.0–5.0)
HCT: 40 % (ref 39.0–52.0)
HEMOGLOBIN: 13.2 g/dL (ref 13.0–17.0)
Lymphocytes Relative: 9 % — ABNORMAL LOW (ref 12.0–46.0)
Lymphs Abs: 0.6 10*3/uL — ABNORMAL LOW (ref 0.7–4.0)
MCHC: 32.9 g/dL (ref 30.0–36.0)
MCV: 85.4 fl (ref 78.0–100.0)
MONO ABS: 0.7 10*3/uL (ref 0.1–1.0)
Monocytes Relative: 9.8 % (ref 3.0–12.0)
NEUTROS ABS: 5.4 10*3/uL (ref 1.4–7.7)
Neutrophils Relative %: 77.1 % — ABNORMAL HIGH (ref 43.0–77.0)
PLATELETS: 179 10*3/uL (ref 150.0–400.0)
RBC: 4.69 Mil/uL (ref 4.22–5.81)
RDW: 16.5 % — AB (ref 11.5–15.5)
WBC: 7 10*3/uL (ref 4.0–10.5)

## 2018-04-06 LAB — BASIC METABOLIC PANEL
BUN: 22 mg/dL (ref 6–23)
CHLORIDE: 102 meq/L (ref 96–112)
CO2: 31 meq/L (ref 19–32)
CREATININE: 0.99 mg/dL (ref 0.40–1.50)
Calcium: 9.6 mg/dL (ref 8.4–10.5)
GFR: 79.24 mL/min (ref >60.00)
GLUCOSE: 146 mg/dL — AB (ref 70–99)
POTASSIUM: 5.3 meq/L — AB (ref 3.5–5.1)
Sodium: 140 mEq/L (ref 135–145)

## 2018-04-06 LAB — LIPID PANEL
CHOLESTEROL: 104 mg/dL (ref 0–200)
HDL: 48.2 mg/dL (ref >39.00)
LDL CALC: 33 mg/dL (ref 0–99)
NonHDL: 56.14
TRIGLYCERIDES: 115 mg/dL (ref 0.0–149.0)
Total CHOL/HDL Ratio: 2
VLDL: 23 mg/dL (ref 0.0–40.0)

## 2018-04-06 LAB — HEPATIC FUNCTION PANEL
ALBUMIN: 4.4 g/dL (ref 3.5–5.2)
ALT: 6 U/L (ref 0–53)
AST: 9 U/L (ref 0–37)
Alkaline Phosphatase: 56 U/L (ref 39–117)
BILIRUBIN TOTAL: 2.6 mg/dL — AB (ref 0.2–1.2)
Bilirubin, Direct: 0.5 mg/dL — ABNORMAL HIGH (ref 0.0–0.3)
Total Protein: 7.1 g/dL (ref 6.0–8.3)

## 2018-04-06 LAB — TSH: TSH: 2.11 u[IU]/mL (ref 0.35–4.50)

## 2018-04-06 LAB — HEMOGLOBIN A1C: HEMOGLOBIN A1C: 6.9 % — AB (ref 4.6–6.5)

## 2018-04-06 MED ORDER — VALSARTAN 40 MG PO TABS: 40.0000 mg | ORAL_TABLET | Freq: Every day | ORAL | 0 refills | Status: DC

## 2018-04-06 NOTE — Patient Instructions (Signed)
It was great seeing you today   You received your flu shot today   I will follow up with you regarding your blood work   Your blood pressure is getting a little low - I am going to cut your dose of Diovan in half, I have sent in a new prescription   Please follow up in 2 weeks for a blood pressure check

## 2018-04-06 NOTE — Progress Notes (Signed)
Subjective:    Patient ID: [name], male    DOB: August 03, 1945, 72 y.o.   MRN: [id number]  HPI  Patient presents for yearly preventative medicine examination. He is a pleasant 72 year old male who  has a past medical history of Atrial fibrillation ([place]), Atrial septal defect, Automatic implantable cardioverter-defibrillator in situ, Cardiomyopathy, CHF (congestive heart failure) ([place]), Chronic combined systolic and diastolic CHF (congestive heart failure) ([place]), Colon polyps, COPD (chronic obstructive pulmonary disease) ([place]), COPD GOLD II (01/11/2007), CVA (cerebral vascular accident) ([place]) (2009), Diabetes mellitus without complication ([place]), Dyslipidemia, Dysrhythmia, Ejection fraction < 50%, Endocarditis, Headache(784.0), HLD (hyperlipidemia), Hypertension, Intracranial hemorrhage ([place]), Lung cancer ([place]) (11/29/2013), Mitral regurgitation, Myocardial infarction ([place]) (2010), Pacemaker, Permanent atrial fibrillation, Prosthetic valve dysfunction, Pulmonary hypertension ([place]), Renal artery stenosis (HCC), 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  DM -takes metformin 1000 mg twice daily.  He denies any symptoms of hypoglycemia. He does not check his blood sugars at home. He has not been watching his diet.   Lab Results  Component Value Date   HGBA1C 6.7 10/20/2017   Permanent AF -he has a device in place.  Not a candidate for anticoagulation.  Seen by cardiology earlier this month  COPD/Hx of lung cancer -chronic oxygen therapy.  Reports that he has not had to go up on oxygen at home  Chronic Systolic Heart failure -takes Lasix and Coreg.  He appears well compensated.  Was seen by cardiology earlier this month  Hypertension -takes Lasix, Coreg, Diovan.  BP well controlled.  He denies any lightheadedness or dizziness BP Readings from Last 3  Encounters:  04/06/18 (!) 100/48  03/27/18 (!) 100/50  03/14/18 104/68   Hyperlipidemia - takes Lipitor - controlled.  Lab Results  Component Value Date   CHOL 93 04/27/2017   HDL 43.50 04/27/2017   LDLCALC 25 04/27/2017   LDLDIRECT 80.2 03/31/2011   TRIG 119.0 04/27/2017   CHOLHDL 2 04/27/2017    All immunizations and health maintenance protocols were reviewed with the patient and needed orders were placed. Due for flu shot   Appropriate screening laboratory values were ordered for the patient including screening of hyperlipidemia, renal function and hepatic function. If indicated by BPH, a PSA was ordered.  Medication reconciliation,  past medical history, social history, problem list and allergies were reviewed in detail with the patient  Goals were established with regard to weight loss, exercise, and  diet in compliance with medications. He has been working out in the yard, picking up sticks.   End of life planning was discussed.  He is due for a colonoscopy( deferred due to significant comorbidity) . He is up to date on vision screens  He has no acute complaints, reports that he " feels pretty good"     Review of Systems  Constitutional: Negative.   HENT: Negative.   Eyes: Negative.   Respiratory: Positive for shortness of breath.   Cardiovascular: Negative.   Gastrointestinal: Negative.   Endocrine: Negative.   Genitourinary: Negative.   Musculoskeletal: Negative.   Skin: Negative.   Allergic/Immunologic: Negative.   Neurological: Negative.   Hematological: Negative.   Psychiatric/Behavioral: Negative.   All other systems reviewed and are negative.  Past Medical History:  Diagnosis Date  . Atrial fibrillation ([place])    AV Node ablation January, 2010, for rapid atrial fib  . Atrial septal defect    Closed with  surgery January, 2010  . Automatic implantable cardioverter-defibrillator in situ    LV dysfunction and pacer needed for AV node lesion  .  Cardiomyopathy    non-ischemic  . CHF (congestive heart failure) ([place])   . Chronic combined systolic and diastolic CHF (congestive heart failure) ([place])   . Colon polyps   . COPD (chronic obstructive pulmonary disease) (HCC)    O2- 2 liters, nasal cannula, q night   . COPD GOLD II 01/11/2007   PFT's 09/25/13  FEV1  2.18 (61%) ratio 64 no change p B2 and DLC0  32% corrects to 68% - trial off advair and acei rec starting  08/23/2013     . CVA (cerebral vascular accident) ([place]) 2009   denies residual on 08/14/2013  . Diabetes mellitus without complication ([place])    type 2  . Dyslipidemia   . Dysrhythmia   . Ejection fraction < 50%   . Endocarditis    Bacterial, 2009  . Headache(784.0)    related to stroke only  . HLD (hyperlipidemia)   . Hypertension   . Intracranial hemorrhage (HCC)    Coumadin cannot be used because of the history of his bleed  . Lung cancer ([place]) 11/29/2013   T1N0 Stage Ia non-small cell carcinoma left lung treated with wedge resection  . Mitral regurgitation    Severe symptomatic primary MR due to bacterial endocarditis, treated w/ MVR // Echo 1/18 EF 40-45, diffuse HK, dilated aorta at 42 mm/aortic root 47 mm; linear echogenic structure in ascending aorta - suspect reverberation artifact / consider CT or TEE to rule out dissection flap, bioprosthetic MVR with mean gradient 3, severe LAE, low normal RVSF, severe RAE, moderate TR, PASP 42  . Myocardial infarction ([place]) 2010  . Pacemaker    combo pacer and icd  . Permanent atrial fibrillation    Originally Coumadin use for atrial fibrillation  //   he had intracerebral hemorrhage with an INR of 2.3 June, 2009. Anticoagulation could no longer be used.  //  Rapid atrial fibrillation after inferior MI October, 2010..........Marland Kitchen AV node ablation done at that time with ICD pacemaker placed (EF 35%).   //   Left atrial appendage tied off at the time of mitral valve surgery January, 2010 (maze pro  . Prosthetic valve dysfunction     Mild mitral stenosis  . Pulmonary hypertension (HCC)    Moderate  . Renal artery stenosis (HCC)    Mild by history  . 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 Spontaneous pneumothorax    right thoracotomy - distant past  . Status post minimally invasive mitral valve replacement with bioprosthetic valve    33 mm Medtronic Mosaic porcine bioprosthesis placed via right mini thoracotomy for bacterial endocarditis complicated by severe MR and CHF   . Thoracic aortic aneurysm ([place]) 08/11/2016   a - Chest CTA 1/18:  Aneurysmal dilatation of aortic root is noted at 5.1 cm.     Social History   Socioeconomic History  . Marital status: Married    Spouse name: [name]  . Number of children: 0  . Years of education: College  . Highest education level: Not on file  Occupational History  . 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 Financial resource strain: Not on file  . Food insecurity:    Worry: Not on file    Inability: Not on file  . Transportation needs:    Medical: Not on  file    Non-medical: Not on file  Tobacco Use  . Smoking status: Former Smoker    Packs/day: 1.00    Years: 45.00    Pack years: 45.00    Types: Cigarettes    Last attempt to quit: 08/11/2013    Years since quitting: 4.6  . Smokeless tobacco: Never Used  . Tobacco comment: 08/14/2013 "quit smoking in 2009"  Substance and Sexual Activity  . Alcohol use: No    Alcohol/week: 0.0 standard drinks    Comment: 08/14/2013 "used to drink beer; quit:in 1982"  . Drug use: No  . Sexual activity: Yes  Lifestyle  . Physical activity:    Days per week: Not on file    Minutes per session: Not on file  . Stress: Not on file  Relationships  . Social connections:    Talks on phone: Not on file    Gets together: Not on file    Attends religious service: Not on file    Active member of club or organization: Not on file    Attends meetings of clubs or organizations: Not on file     Relationship status: Not on file  . Intimate partner violence:    Fear of current or ex partner: Not on file    Emotionally abused: Not on file    Physically abused: Not on file    Forced sexual activity: Not on file  Other Topics Concern  . Not on file  Social History Narrative   Patient lives at home with his spouse.   Caffeine Use: none   Worked for the [employer]   Has two boys and a girl. All live local.     Past Surgical History:  Procedure Laterality Date  . APPENDECTOMY    . ASD REPAIR, SECUNDUM  07/17/2008   pericardial patch closure of ASD  . AV NODE ABLATION  07/2008   for rapid atrial fib  . CARDIAC CATHETERIZATION    . CARDIAC DEFIBRILLATOR PLACEMENT  ~ [address]  . CARDIAC VALVE REPLACEMENT    . CATARACT EXTRACTION W/ INTRAOCULAR LENS  IMPLANT, BILATERAL Bilateral   . ESOPHAGOGASTRODUODENOSCOPY (EGD) WITH PROPOFOL N/A 09/29/2016   Procedure: ESOPHAGOGASTRODUODENOSCOPY (EGD) WITH PROPOFOL;  Surgeon: [name], MD;  Location: [facility] ENDOSCOPY;  Service: Endoscopy;  Laterality: N/A;  . HERNIA REPAIR    . IMPLANTABLE CARDIOVERTER DEFIBRILLATOR (ICD) GENERATOR CHANGE N/A 02/06/2012   Procedure: ICD GENERATOR CHANGE;  Surgeon: [name], MD;  [facility] C[facility] LAB;  Service: Cardiovascular;  Laterality: N/A;  . INSERT / REPLACE / REMOVE PACEMAKER    . MASS BIOPSY Left    neck mass  . MITRAL VALVE REPLACEMENT Right 07/17/2008   62mm Medtronic Mosaic porcine bioprosthesis  . PENILE PROSTHESIS IMPLANT    . RIGHT HEART CATHETERIZATION N/A 08/16/2013   Procedure: RIGHT HEART CATH;  Surgeon: Josue Hector, MD;  Location: [facility] CATH LAB;  Service: Cardiovascular;  Laterality: N/A;  . TEE WITHOUT CARDIOVERSION N/A 09/06/2016   Procedure: TRANSESOPHAGEAL ECHOCARDIOGRAM (TEE);  Surgeon: Dorothy Spark, MD;  Location: [facility] ENDOSCOPY;  Service: Cardiovascular;  Laterality: N/A;  . THORACOTOMY Right 1970's   spontaneous pneumothorax - while in the [place]  . TOE SURGERY      left foot hammer toe  . TONSILLECTOMY    . VIDEO ASSISTED THORACOSCOPY (VATS)/WEDGE RESECTION Left 11/29/2013   Procedure: Video assisted thoracoscopy for wedge resection; mini thoracotomy;  Surgeon: [name], MD;  Location: [facility];  Service: Thoracic;  Laterality: Left;    Family History  Problem Relation Age of Onset  . Stomach cancer Father   . Stroke Mother     Allergies  Allergen Reactions  . Anticoagulant Compound Other (See Comments)    Pt had intracranial bleed, therefore all anticoagulation is contra-indicated per Dr. Ron Parker.  . Warfarin Sodium Other (See Comments)    Pt had intracranial bleed, therefore all anticoagulation is contra-indicated per Dr. Ron Parker.     Current Outpatient Medications on File Prior to Visit  Medication Sig Dispense Refill  . acetaminophen (TYLENOL) 500 MG tablet Take 500-1,000 mg by mouth every 6 (six) hours as needed for moderate pain or headache.    Marland Kitchen amoxicillin (AMOXIL) 500 MG capsule Take 2,000 mg by mouth as directed. 1 hour prior to dental procedures    . aspirin EC 81 MG tablet Take 1 tablet (81 mg total) by mouth daily. 90 tablet 3  . atorvastatin (LIPITOR) 10 MG tablet Take 10 mg by mouth at bedtime.    . budesonide-formoterol (SYMBICORT) 160-4.5 MCG/ACT inhaler INHALE 2 PUFFS INTO THE LUNGS TWICE A DAY 10.2 Inhaler 0  . carvedilol (COREG) 3.125 MG tablet TAKE 1 TABLET BY MOUTH TWICE A DAY WITH MEALS 180 tablet 1  . furosemide (LASIX) 40 MG tablet Take 40 mg by mouth daily. May take 1 extra daily as needed for edema    . metFORMIN (GLUCOPHAGE) 1000 MG tablet TAKE 1 TABLET BY MOUTH TWICE A DAY WITH MEALS 180 tablet 1  . nitroGLYCERIN (NITROSTAT) 0.4 MG SL tablet Place 0.4 mg under the tongue every 5 (five) minutes as needed for chest pain (x 3 doses). Reported on 12/29/2015    . Olopatadine HCl (PAZEO) 0.7 % SOLN Apply 1 drop to eye daily. 1 Bottle 0  . OXYGEN Inhale 2 L/min into the lungs continuous.    . valsartan (DIOVAN) 80 MG tablet  Take 1 tablet (80 mg total) by mouth daily. 90 tablet 1   No current facility-administered medications on file prior to visit.     BP (!) 100/48 (BP Location: Left Arm, Patient Position: Sitting, Cuff Size: Normal)   Pulse 73   Temp 98.2 F (36.8 C) (Oral)   Ht 6' (1.829 m)   Wt 180 lb 9.6 oz (81.9 kg)   SpO2 (!) 82%   BMI 24.49 kg/m       Objective:   Physical Exam  Constitutional: He is oriented to person, place, and time. He appears well-developed and well-nourished. No distress.  HENT:  Head: Normocephalic and atraumatic.  Right Ear: External ear normal.  Left Ear: External ear normal.  Nose: Nose normal.  Mouth/Throat: Oropharynx is clear and moist. No oropharyngeal exudate.  Eyes: Pupils are equal, round, and reactive to light. Conjunctivae and EOM are normal. Right eye exhibits no discharge. Left eye exhibits no discharge. No scleral icterus.  Neck: Normal range of motion. Neck supple. No JVD present. No tracheal deviation present. No thyromegaly present.  Cardiovascular: Normal rate, regular rhythm, normal heart sounds and intact distal pulses. Exam reveals no gallop and no friction rub.  No murmur heard. Pulmonary/Chest: Effort normal and breath sounds normal. No stridor. No respiratory distress. He has no wheezes. He has no rales. He exhibits no tenderness.  2 l via [place]   Abdominal: Soft. Bowel sounds are normal. He exhibits no distension and no mass. There is no tenderness. There is no rebound and no guarding. No hernia.  Musculoskeletal: Normal range of motion.  He exhibits no edema, tenderness or deformity.  Lymphadenopathy:    He has no cervical adenopathy.  Neurological: He is alert and oriented to person, place, and time. He displays normal reflexes. No cranial nerve deficit or sensory deficit. He exhibits normal muscle tone. Coordination normal.  Skin: Skin is warm and dry. No rash noted. He is not diaphoretic. No erythema. No pallor.  Psychiatric: He has a normal  mood and affect. Judgment and thought content normal.  Nursing note and vitals reviewed.     Assessment & Plan:  1. Essential hypertension - Hypotensive today and in the past. Will decrease Diovan to 40 mg. Follow up in two weeks or sooner if needed - Basic metabolic panel - CBC with Differential/Platelet - Hemoglobin A1c - Hepatic function panel - Lipid panel - TSH - valsartan (DIOVAN) 40 MG tablet; Take 1 tablet (40 mg total) by mouth daily.  Dispense: 90 tablet; Refill: 0  2. Permanent atrial fibrillation (HCC) - stable. Rate controlled.  - Basic metabolic panel - CBC with Differential/Platelet - Hemoglobin A1c - Hepatic function panel - Lipid panel - TSH  3. Chronic systolic CHF (congestive heart failure) (HCC) - stable  - Basic metabolic panel - CBC with Differential/Platelet - Hepatic function panel  4. COPD GOLD II - Stable  - Basic metabolic panel - CBC with Differential/Platelet - Hemoglobin A1c - Hepatic function panel - Lipid panel - TSH  5. Type 2 diabetes mellitus with complication, without long-term current use of insulin (HCC) - Consider increase in Metformin  - Encouraged heart healthy diet  - Basic metabolic panel - CBC with Differential/Platelet - Hemoglobin A1c - Hepatic function panel - Lipid panel - TSH  6. Dyslipidemia - Consider increase in statin  - Basic metabolic panel - CBC with Differential/Platelet - Hemoglobin A1c - Hepatic function panel - Lipid panel - TSH  7. Need for influenza vaccination  - Flu vaccine HIGH DOSE PF (Fluzone High dose)   [name], NP

## 2018-04-14 ENCOUNTER — Other Ambulatory Visit: Payer: Self-pay | Admitting: Adult Health

## 2018-04-14 DIAGNOSIS — E119 Type 2 diabetes mellitus without complications: Secondary | ICD-10-CM

## 2018-04-16 ENCOUNTER — Other Ambulatory Visit: Payer: Self-pay | Admitting: Adult Health

## 2018-04-16 NOTE — Telephone Encounter (Signed)
Sent to the pharmacy by e-scribe. 

## 2018-04-18 NOTE — Telephone Encounter (Signed)
Medication filled to pharmacy as requested.   

## 2018-04-20 ENCOUNTER — Encounter: Payer: Self-pay | Admitting: Adult Health

## 2018-04-20 ENCOUNTER — Ambulatory Visit (INDEPENDENT_AMBULATORY_CARE_PROVIDER_SITE_OTHER): Payer: Medicare Other | Admitting: Adult Health

## 2018-04-20 VITALS — BP 114/68 | HR 87 | Temp 98.0°F | Wt 179.2 lb

## 2018-04-20 DIAGNOSIS — I952 Hypotension due to drugs: Secondary | ICD-10-CM | POA: Diagnosis not present

## 2018-04-20 NOTE — Progress Notes (Signed)
Subjective:    Patient ID: [name], male    DOB: 06-Jun-1946, 72 y.o.   MRN: [id number]  HPI 72 year old male who  has a past medical history of Atrial fibrillation ([place]), Atrial septal defect, Automatic implantable cardioverter-defibrillator in situ, Cardiomyopathy, CHF (congestive heart failure) ([place]), Chronic combined systolic and diastolic CHF (congestive heart failure) ([place]), Colon polyps, COPD (chronic obstructive pulmonary disease) ([place]), COPD GOLD II (01/11/2007), CVA (cerebral vascular accident) ([place]) (2009), Diabetes mellitus without complication ([place]), Dyslipidemia, Dysrhythmia, Ejection fraction < 50%, Endocarditis, Headache(784.0), HLD (hyperlipidemia), Hypertension, Intracranial hemorrhage ([place]), Lung cancer ([place]) (11/29/2013), Mitral regurgitation, Myocardial infarction ([place]) (2010), Pacemaker, Permanent atrial fibrillation, Prosthetic valve dysfunction, Pulmonary hypertension ([place]), Renal artery stenosis (HCC), 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   During his physical two weeks ago Diovan was decreased to 80 mg to 40 mg due to hypotension.  He is here for follow-up and blood pressure check. He reports that since he started the new dose of Diovan " I actually feel better, I feel like I have more energy". He denies any lightheadedness, dizziness, headaches or blurred vision   BP Readings from Last 3 Encounters:  04/20/18 114/68  04/06/18 (!) 100/48  03/27/18 (!) 100/50   Review of Systems See HPI   Past Medical History:  Diagnosis Date  . Atrial fibrillation ([place])    AV Node ablation January, 2010, for rapid atrial fib  . Atrial septal defect    Closed with surgery January, 2010  . Automatic implantable cardioverter-defibrillator in situ    LV dysfunction and pacer needed for AV node lesion  . Cardiomyopathy    non-ischemic  . CHF (congestive heart failure) ([place])   . Chronic combined systolic and diastolic CHF (congestive heart failure) ([place])   . Colon polyps   . COPD (chronic obstructive pulmonary disease) (HCC)    O2- 2 liters, nasal cannula, q night   . COPD GOLD II 01/11/2007   PFT's 09/25/13  FEV1  2.18 (61%) ratio 64 no change p B2 and DLC0  32% corrects to 68% - trial off advair and acei rec starting  08/23/2013     . CVA (cerebral vascular accident) ([place]) 2009   denies residual on 08/14/2013  . Diabetes mellitus without complication ([place])    type 2  . Dyslipidemia   . Dysrhythmia   . Ejection fraction < 50%   . Endocarditis    Bacterial, 2009  . Headache(784.0)    related to stroke only  . HLD (hyperlipidemia)   . Hypertension   . Intracranial hemorrhage (HCC)    Coumadin cannot be used because of the history of his bleed  . Lung cancer ([place]) 11/29/2013   T1N0 Stage Ia non-small cell carcinoma left lung treated with wedge resection  . Mitral regurgitation    Severe symptomatic primary MR due to bacterial endocarditis, treated w/ MVR // Echo 1/18 EF 40-45, diffuse HK, dilated aorta at 42 mm/aortic root 47 mm; linear echogenic structure in ascending aorta - suspect reverberation artifact / consider CT or TEE to rule out dissection flap, bioprosthetic MVR with mean gradient 3, severe LAE, low normal RVSF, severe RAE, moderate TR, PASP 42  . Myocardial infarction ([place]) 2010  . Pacemaker    combo pacer and icd  . Permanent atrial fibrillation    Originally Coumadin use for atrial fibrillation  //   he had intracerebral hemorrhage  with an INR of 2.3 June, 2009. Anticoagulation could no longer be used.  //  Rapid atrial fibrillation after inferior MI October, 2010..........Marland Kitchen AV node ablation done at that time with ICD pacemaker placed (EF 35%).   //   Left atrial appendage tied off at the time of mitral valve surgery January, 2010 (maze pro  . Prosthetic valve dysfunction    Mild mitral stenosis    . Pulmonary hypertension (HCC)    Moderate  . Renal artery stenosis (HCC)    Mild by history  . 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 Spontaneous pneumothorax    right thoracotomy - distant past  . Status post minimally invasive mitral valve replacement with bioprosthetic valve    33 mm Medtronic Mosaic porcine bioprosthesis placed via right mini thoracotomy for bacterial endocarditis complicated by severe MR and CHF   . Thoracic aortic aneurysm ([place]) 08/11/2016   a - Chest CTA 1/18:  Aneurysmal dilatation of aortic root is noted at 5.1 cm.     Social History   Socioeconomic History  . Marital status: Married    Spouse name: [name]  . Number of children: 0  . Years of education: College  . Highest education level: Not on file  Occupational History  . 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 Financial resource strain: Not on file  . Food insecurity:    Worry: Not on file    Inability: Not on file  . Transportation needs:    Medical: Not on file    Non-medical: Not on file  Tobacco Use  . Smoking status: Former Smoker    Packs/day: 1.00    Years: 45.00    Pack years: 45.00    Types: Cigarettes    Last attempt to quit: 08/11/2013    Years since quitting: 4.6  . Smokeless tobacco: Never Used  . Tobacco comment: 08/14/2013 "quit smoking in 2009"  Substance and Sexual Activity  . Alcohol use: No    Alcohol/week: 0.0 standard drinks    Comment: 08/14/2013 "used to drink beer; quit:in 1982"  . Drug use: No  . Sexual activity: Yes  Lifestyle  . Physical activity:    Days per week: Not on file    Minutes per session: Not on file  . Stress: Not on file  Relationships  . Social connections:    Talks on phone: Not on file    Gets together: Not on file    Attends religious service: Not on file    Active member of club or organization: Not on file    Attends meetings of clubs or organizations: Not on file    Relationship status: Not  on file  . Intimate partner violence:    Fear of current or ex partner: Not on file    Emotionally abused: Not on file    Physically abused: Not on file    Forced sexual activity: Not on file  Other Topics Concern  . Not on file  Social History Narrative   Patient lives at home with his spouse.   Caffeine Use: none   Worked for the [employer]   Has two boys and a girl. All live local.     Past Surgical History:  Procedure Laterality Date  . APPENDECTOMY    . ASD REPAIR, SECUNDUM  07/17/2008   pericardial patch closure of ASD  . AV NODE ABLATION  07/2008   for rapid atrial  fib  . CARDIAC CATHETERIZATION    . CARDIAC DEFIBRILLATOR PLACEMENT  ~ [address] [place]  . CARDIAC VALVE REPLACEMENT    . CATARACT EXTRACTION W/ INTRAOCULAR LENS  IMPLANT, BILATERAL Bilateral   . ESOPHAGOGASTRODUODENOSCOPY (EGD) WITH PROPOFOL N/A 09/29/2016   Procedure: ESOPHAGOGASTRODUODENOSCOPY (EGD) WITH PROPOFOL;  Surgeon: [name], MD;  Location: [facility] ENDOSCOPY;  Service: Endoscopy;  Laterality: N/A;  . HERNIA REPAIR    . IMPLANTABLE CARDIOVERTER DEFIBRILLATOR (ICD) GENERATOR CHANGE N/A 02/06/2012   Procedure: ICD GENERATOR CHANGE;  Surgeon: [name], MD;  Location: [facility] CATH LAB;  Service: Cardiovascular;  Laterality: N/A;  . INSERT / REPLACE / REMOVE PACEMAKER    . MASS BIOPSY Left    neck mass  . MITRAL VALVE REPLACEMENT Right 07/17/2008   59mm Medtronic Mosaic porcine bioprosthesis  . PENILE PROSTHESIS IMPLANT    . RIGHT HEART CATHETERIZATION N/A 08/16/2013   Procedure: RIGHT HEART CATH;  Surgeon: Josue Hector, MD;  Location: [facility] CATH LAB;  Service: Cardiovascular;  Laterality: N/A;  . TEE WITHOUT CARDIOVERSION N/A 09/06/2016   Procedure: TRANSESOPHAGEAL ECHOCARDIOGRAM (TEE);  Surgeon: Dorothy Spark, MD;  Location: [facility] ENDOSCOPY;  Service: Cardiovascular;  Laterality: N/A;  . THORACOTOMY Right 1970's   spontaneous pneumothorax - while in the [place]  . TOE SURGERY     left foot hammer toe  .  TONSILLECTOMY    . VIDEO ASSISTED THORACOSCOPY (VATS)/WEDGE RESECTION Left 11/29/2013   Procedure: Video assisted thoracoscopy for wedge resection; mini thoracotomy;  Surgeon: [name], MD;  Location: [facility] OR;  Service: Thoracic;  Laterality: Left;    Family History  Problem Relation Age of Onset  . Stomach cancer Father   . Stroke Mother     Allergies  Allergen Reactions  . Anticoagulant Compound Other (See Comments)    Pt had intracranial bleed, therefore all anticoagulation is contra-indicated per Dr. Ron Parker.  . Warfarin Sodium Other (See Comments)    Pt had intracranial bleed, therefore all anticoagulation is contra-indicated per Dr. Ron Parker.     Current Outpatient Medications on File Prior to Visit  Medication Sig Dispense Refill  . acetaminophen (TYLENOL) 500 MG tablet Take 500-1,000 mg by mouth every 6 (six) hours as needed for moderate pain or headache.    Marland Kitchen amoxicillin (AMOXIL) 500 MG capsule Take 2,000 mg by mouth as directed. 1 hour prior to dental procedures    . aspirin EC 81 MG tablet Take 1 tablet (81 mg total) by mouth daily. 90 tablet 3  . atorvastatin (LIPITOR) 10 MG tablet TAKE 1 TABLET BY MOUTH EVERY DAY 90 tablet 3  . budesonide-formoterol (SYMBICORT) 160-4.5 MCG/ACT inhaler INHALE 2 PUFFS INTO THE LUNGS TWICE A DAY 10.2 Inhaler 0  . carvedilol (COREG) 3.125 MG tablet TAKE 1 TABLET BY MOUTH TWICE A DAY WITH MEALS 180 tablet 1  . furosemide (LASIX) 40 MG tablet TAKE 1 TABLET BY MOUTH EVERY DAY 90 tablet 3  . metFORMIN (GLUCOPHAGE) 1000 MG tablet TAKE 1 TABLET BY MOUTH TWICE A DAY WITH MEALS 180 tablet 1  . nitroGLYCERIN (NITROSTAT) 0.4 MG SL tablet Place 0.4 mg under the tongue every 5 (five) minutes as needed for chest pain (x 3 doses). Reported on 12/29/2015    . Olopatadine HCl (PAZEO) 0.7 % SOLN Apply 1 drop to eye daily. 1 Bottle 0  . OXYGEN Inhale 2 L/min into the lungs continuous.    . valsartan (DIOVAN) 40 MG tablet Take 1 tablet (40 mg total) by mouth  daily. 90 tablet 0   No current facility-administered medications on file prior to visit.     BP 114/68 (BP Location: Left Arm, Patient Position: Sitting, Cuff Size: Normal)   Pulse 87   Temp 98 F (36.7 C) (Oral)   Wt 179 lb 3.2 oz (81.3 kg)   SpO2 90%   BMI 24.30 kg/m       Objective:   Physical Exam  Constitutional: He is oriented to person, place, and time. He appears well-developed and well-nourished.  Neurological: He is alert and oriented to person, place, and time.  Skin: Skin is warm and dry.  Psychiatric: He has a normal mood and affect. His behavior is normal. Judgment and thought content normal.  Nursing note and vitals reviewed.     Assessment & Plan:  1. Hypotension due to drugs - Will keep on Diovan 40 mg  - Follow up as needed  [name], NP

## 2018-04-24 ENCOUNTER — Other Ambulatory Visit: Payer: Self-pay | Admitting: Pulmonary Disease

## 2018-05-07 ENCOUNTER — Telehealth: Payer: Self-pay | Admitting: Adult Health

## 2018-05-07 ENCOUNTER — Other Ambulatory Visit: Payer: Self-pay | Admitting: Pulmonary Disease

## 2018-05-07 NOTE — Telephone Encounter (Signed)
Copied from [place] [phone]. Topic: General - Other >> May 07, 2018  9:52 AM Lennox Solders wrote: Reason for CRM: pt needs refill on symbicort. [facility] [address]

## 2018-05-08 ENCOUNTER — Telehealth: Payer: Self-pay | Admitting: Pulmonary Disease

## 2018-05-08 ENCOUNTER — Other Ambulatory Visit: Payer: Self-pay | Admitting: Pulmonary Disease

## 2018-05-08 NOTE — Telephone Encounter (Signed)
Pt states that Tommi Rumps has been filling this medicine for him he states that he fils all his medicines

## 2018-05-08 NOTE — Telephone Encounter (Signed)
Looks like this prescription has been filled by another provider.  Please advise.

## 2018-05-08 NOTE — Telephone Encounter (Signed)
Left a message for a return call.  Agent may tell the pt to call Dr. Bari Mantis office for refill as he/she is the prescribing provider.  CRM created.

## 2018-05-08 NOTE — Telephone Encounter (Signed)
Spoke to the pt's wife.  Informed her that last 5 times medication was filled it was given by Dr. Elsworth Soho.  She will call Dr. Bari Mantis office.  No further action required.

## 2018-05-08 NOTE — Telephone Encounter (Signed)
That is filled by Dr. Elsworth Soho

## 2018-05-08 NOTE — Telephone Encounter (Signed)
Due to it being over a year since we have seen pt, pt will need an appt before he can receive med refills.  Called pt and spoke with his wife [name] stating this information to her. I stated to her that pt could check with PCP to see if they would refill med if pt did not want to make an appt.  [name] stated they did call PCP and they were told to contact our office.  I stated to her again that we could not refill med for pt without an appt.  [name] expressed understanding. I scheduled pt an appt tomorrow, 10/30 with Wyn Quaker, NP at 9:15. Nothing further needed.

## 2018-05-08 NOTE — Progress Notes (Signed)
@Patient  ID: [name], male    DOB: 08-11-1945, 72 y.o.   MRN: [id number]  Chief Complaint  Patient presents with  . Follow-up    Refill of medications, COPD follow-up    Referring provider: [name], NP  HPI:  72 year old male current every day smoker followed in our office for COPD  PMH: Cardiomyopathy, bioprosthetic mitral valve, pulmonary hypertension, left VATS with wedge resection of left upper lobe by Dr. Roxy Manns (11/2013-well-differentiated adenocarcinoma) Smoker/ Smoking History: Former smoker.  40-pack-year smoking history.  Quit 2015. Maintenance:  Symbicort 160 Pt of: Dr. Elsworth Soho   Recent [facility] Encounters:   Last seen June/2018 in [place]  05/09/2018  - Visit   72 year old male patient following up with our office today to get refills of his Symbicort 160.  Patient reports he is been doing well.  Patient reports he had close follow-up with cardiology and will see them again next month.  Patient reports close follow-up with primary care.  Patient continues to use 2 L of oxygen regularly.  Reports no drops in oxygenation.  Patient reporting that he was just here today to get refills.  He has no issues or complaints at this time.   Tests:   TEE 08/2016 EF 45%  PFTs 09/2013 - FEV1 2.18- 61%, ratio 64, FVC 3.40 -71%, DLCO 11.0- 32%  CPET 11/20/13 FEV1 2.28 -58%, MVV 105 (71%), peak V O2 12.7, moderate-to-severe functional limitation due primarily to a circulatory limitation   PFTs 08/2016 ratio 60, FEV1 52%, FVC 64%, DLCO 23%  FENO:  No results found for: NITRICOXIDE  PFT: PFT Results Latest Ref Rng & Units 08/29/2016 09/25/2013  FVC-Pre L 2.98 3.40  FVC-Predicted Pre % 64 71  FVC-Post L 3.14 3.65  FVC-Predicted Post % 67 77  Pre FEV1/FVC % % 60 64  Post FEV1/FCV % % 64 65  FEV1-Pre L 1.78 2.18  FEV1-Predicted Pre % 52 61  DLCO UNC% % 23 32  DLCO COR %Predicted % 40 47  TLC L 7.21 5.61  TLC % Predicted % 99 77  RV % Predicted % 168 83     Imaging: No results found.  Chart Review:    Specialty Problems      Pulmonary Problems   COPD GOLD II    PFT's 09/25/13  FEV1  2.18 (61%) ratio 64 no change p B2 and DLC0  32% corrects to 68% -(pre-op)    PFTs 08/2016 ratio 60, FEV1 52%, FVC 64%, DLCO 23%      Primary cancer of left upper lobe of lung ([place])    Qualifier: Diagnosis of  By: Sherren Mocha MD, Dellis Filbert A       Chronic respiratory failure assoc with chf/ PAH    Significant hypoxia when seen in the office and admitted to the hospital August 12, 2013 - rx  2lpm exertion & sleep       Lung cancer ([place])    T1N0 Stage Ia non-small cell carcinoma left lung treated with wedge resection         Allergies  Allergen Reactions  . Anticoagulant Compound Other (See Comments)    Pt had intracranial bleed, therefore all anticoagulation is contra-indicated per Dr. Ron Parker.  . Warfarin Sodium Other (See Comments)    Pt had intracranial bleed, therefore all anticoagulation is contra-indicated per Dr. Ron Parker.     Immunization History  Administered Date(s) Administered  . Hep A / Hep B 09/25/2014, 10/02/2014, 10/24/2014, 10/02/2015  . Influenza Split 03/31/2011, 04/02/2012  .  Influenza Whole 07/11/2000, 04/01/2009, 04/12/2010  . Influenza, High Dose Seasonal PF 05/01/2015, 04/26/2017, 04/06/2018  . Influenza,inj,Quad PF,6+ Mos 04/04/2013, 04/18/2014  . Influenza-Unspecified 03/23/2016  . Pneumococcal Conjugate-13 08/05/2013  . Pneumococcal Polysaccharide-23 07/11/2000, 04/01/2009, 04/04/2013  . Td 07/11/2000  . Tdap 08/03/2011    Past Medical History:  Diagnosis Date  . Atrial fibrillation ([place])    AV Node ablation January, 2010, for rapid atrial fib  . Atrial septal defect    Closed with surgery January, 2010  . Automatic implantable cardioverter-defibrillator in situ    LV dysfunction and pacer needed for AV node lesion  . Cardiomyopathy    non-ischemic  . CHF (congestive heart failure) ([place])   . Chronic combined  systolic and diastolic CHF (congestive heart failure) ([place])   . Colon polyps   . COPD (chronic obstructive pulmonary disease) (H[facility])    O2- 2 liters, nasal cannula, q night   . COPD GOLD II 01/11/2007   PFT's 09/25/13  FEV1  2.18 (61%) ratio 64 no change p B2 and DLC0  32% corrects to 68% - trial off advair and acei rec starting  08/23/2013     . CVA (cerebral vascular accident) ([place]) 2009   denies residual on 08/14/2013  . Diabetes mellitus without complication ([place])    type 2  . Dyslipidemia   . Dysrhythmia   . Ejection fraction < 50%   . Endocarditis    Bacterial, 2009  . Headache(784.0)    related to stroke only  . HLD (hyperlipidemia)   . Hypertension   . Intracranial hemorrhage (H[facility])    Coumadin cannot be used because of the history of his bleed  . Lung cancer ([place]) 11/29/2013   T1N0 Stage Ia non-small cell carcinoma left lung treated with wedge resection  . Mitral regurgitation    Severe symptomatic primary MR due to bacterial endocarditis, treated w/ MVR // Echo 1/18 EF 40-45, diffuse HK, dilated aorta at 42 mm/aortic root 47 mm; linear echogenic structure in ascending aorta - suspect reverberation artifact / consider CT or TEE to rule out dissection flap, bioprosthetic MVR with mean gradient 3, severe LAE, low normal RVSF, severe RAE, moderate TR, PASP 42  . Myocardial infarction ([place]) 2010  . Pacemaker    combo pacer and icd  . Permanent atrial fibrillation    Originally Coumadin use for atrial fibrillation  //   he had intracerebral hemorrhage with an INR of 2.3 June, 2009. Anticoagulation could no longer be used.  //  Rapid atrial fibrillation after inferior MI October, 2010..........Marland Kitchen AV node ablation done at that time with ICD pacemaker placed (EF 35%).   //   Left atrial appendage tied off at the time of mitral valve surgery January, 2010 (maze pro  . Prosthetic valve dysfunction    Mild mitral stenosis  . Pulmonary hypertension (HCC)    Moderate  . Renal artery stenosis  (HCC)    Mild by history  . 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 Spontaneous pneumothorax    right thoracotomy - distant past  . Status post minimally invasive mitral valve replacement with bioprosthetic valve    33 mm Medtronic Mosaic porcine bioprosthesis placed via right mini thoracotomy for bacterial endocarditis complicated by severe MR and CHF   . Thoracic aortic aneurysm ([place]) 08/11/2016   a - Chest CTA 1/18:  Aneurysmal dilatation of aortic root is noted at 5.1 cm.     Tobacco History: Social History   Tobacco Use  Smoking Status Former  Smoker  . Packs/day: 1.00  . Years: 45.00  . Pack years: 45.00  . Types: Cigarettes  . Last attempt to quit: 08/11/2013  . Years since quitting: 4.7  Smokeless Tobacco Never Used  Tobacco Comment   08/14/2013 "quit smoking in 2009"   Counseling given: Yes Comment: 08/14/2013 "quit smoking in 2009"  Continue to not smoke  Outpatient Encounter Medications as of 05/09/2018  Medication Sig  . acetaminophen (TYLENOL) 500 MG tablet Take 500-1,000 mg by mouth every 6 (six) hours as needed for moderate pain or headache.  Marland Kitchen amoxicillin (AMOXIL) 500 MG capsule Take 2,000 mg by mouth as directed. 1 hour prior to dental procedures  . aspirin EC 81 MG tablet Take 1 tablet (81 mg total) by mouth daily.  Marland Kitchen atorvastatin (LIPITOR) 10 MG tablet TAKE 1 TABLET BY MOUTH EVERY DAY  . budesonide-formoterol (SYMBICORT) 160-4.5 MCG/ACT inhaler INHALE 2 PUFFS INTO THE LUNGS TWICE A DAY  . carvedilol (COREG) 3.125 MG tablet TAKE 1 TABLET BY MOUTH TWICE A DAY WITH MEALS  . furosemide (LASIX) 40 MG tablet TAKE 1 TABLET BY MOUTH EVERY DAY  . metFORMIN (GLUCOPHAGE) 1000 MG tablet TAKE 1 TABLET BY MOUTH TWICE A DAY WITH MEALS  . nitroGLYCERIN (NITROSTAT) 0.4 MG SL tablet Place 0.4 mg under the tongue every 5 (five) minutes as needed for chest pain (x 3 doses). Reported on 12/29/2015  . Olopatadine HCl (PAZEO) 0.7 % SOLN Apply 1 drop to eye daily.  . OXYGEN Inhale 2 L/min  into the lungs continuous.  . valsartan (DIOVAN) 40 MG tablet Take 1 tablet (40 mg total) by mouth daily.   No facility-administered encounter medications on file as of 05/09/2018.     Review of Systems  Review of Systems  Constitutional: Negative for activity change, chills, fatigue, fever and unexpected weight change.  HENT: Negative for postnasal drip, rhinorrhea, sinus pressure, sinus pain, sneezing and sore throat.   Eyes: Negative.   Respiratory: Negative for cough, shortness of breath and wheezing.   Cardiovascular: Negative for chest pain and palpitations.  Gastrointestinal: Negative for constipation, diarrhea, nausea and vomiting.  Endocrine: Negative.   Musculoskeletal: Negative.   Skin: Negative.   Neurological: Negative for dizziness and headaches.  Psychiatric/Behavioral: Negative.  Negative for dysphoric mood. The patient is not nervous/anxious.   All other systems reviewed and are negative.    Physical Exam  BP (!) 94/58 (BP Location: Left Arm, Cuff Size: Normal)   Pulse 77   Ht 6\' 1"  (1.854 m)   Wt 180 lb 3.2 oz (81.7 kg)   SpO2 92%   BMI 23.77 kg/m   Wt Readings from Last 5 Encounters:  05/09/18 180 lb 3.2 oz (81.7 kg)  04/20/18 179 lb 3.2 oz (81.3 kg)  04/06/18 180 lb 9.6 oz (81.9 kg)  03/27/18 180 lb 1.9 oz (81.7 kg)  03/14/18 178 lb 12.8 oz (81.1 kg)     Physical Exam  Constitutional: He is oriented to person, place, and time and well-developed, well-nourished, and in no distress. No distress.  HENT:  Head: Normocephalic and atraumatic.  Right Ear: Tympanic membrane, external ear and ear canal normal.  Left Ear: Tympanic membrane, external ear and ear canal normal.  Nose: Nose normal. Right sinus exhibits no maxillary sinus tenderness and no frontal sinus tenderness. Left sinus exhibits no maxillary sinus tenderness and no frontal sinus tenderness.  Mouth/Throat: Uvula is midline and oropharynx is clear and moist. No oropharyngeal exudate.  + Hard  of hearing  Eyes:  Pupils are equal, round, and reactive to light.  Neck: Normal range of motion. Neck supple. No JVD present.  Cardiovascular: Normal rate, regular rhythm and normal heart sounds.  Pulmonary/Chest: Effort normal and breath sounds normal. No accessory muscle usage. No respiratory distress. He has no decreased breath sounds. He has no wheezes. He has no rhonchi. He has no rales.  Abdominal: Soft. Bowel sounds are normal. There is no tenderness.  Musculoskeletal: Normal range of motion. He exhibits no edema.  Lymphadenopathy:    He has no cervical adenopathy.  Neurological: He is alert and oriented to person, place, and time. Gait normal.  Skin: Skin is warm and dry. He is not diaphoretic.  Scattered healing bruising most likely due to patient's chronic Coumadin use  Psychiatric: Mood, memory, affect and judgment normal.  Nursing note and vitals reviewed.     Lab Results:  CBC    Component Value Date/Time   WBC 7.0 04/06/2018 0942   RBC 4.69 04/06/2018 0942   HGB 13.2 04/06/2018 0942   HGB 15.6 12/20/2016 0746   HCT 40.0 04/06/2018 0942   HCT 49.3 12/20/2016 0746   PLT 179.0 04/06/2018 0942   PLT 151 12/20/2016 0746   MCV 85.4 04/06/2018 0942   MCV 96.5 12/20/2016 0746   MCH 27.4 12/22/2017 1020   MCHC 32.9 04/06/2018 0942   RDW 16.5 (H) 04/06/2018 0942   RDW 15.2 (H) 12/20/2016 0746   LYMPHSABS 0.6 (L) 04/06/2018 0942   LYMPHSABS 0.8 (L) 12/20/2016 0746   MONOABS 0.7 04/06/2018 0942   MONOABS 0.7 12/20/2016 0746   EOSABS 0.2 04/06/2018 0942   EOSABS 0.2 12/20/2016 0746   BASOSABS 0.1 04/06/2018 0942   BASOSABS 0.0 12/20/2016 0746    BMET    Component Value Date/Time   NA 140 04/06/2018 0942   NA 140 12/20/2016 0746   K 5.3 (H) 04/06/2018 0942   K 5.1 12/20/2016 0746   CL 102 04/06/2018 0942   CO2 31 04/06/2018 0942   CO2 29 12/20/2016 0746   GLUCOSE 146 (H) 04/06/2018 0942   GLUCOSE 166 (H) 12/20/2016 0746   GLUCOSE 100 (H) 07/19/2006 1101    BUN 22 04/06/2018 0942   BUN 20.6 12/20/2016 0746   CREATININE 0.99 04/06/2018 0942   CREATININE 1.2 12/20/2016 0746   CALCIUM 9.6 04/06/2018 0942   CALCIUM 10.0 12/20/2016 0746   GFRNONAA >60 12/22/2017 1020   GFRAA >60 12/22/2017 1020    BNP    Component Value Date/Time   BNP 285.0 (H) 09/24/2015 1350    ProBNP    Component Value Date/Time   PROBNP 1,738.0 (H) 08/15/2013 1405      Assessment & Plan:   72 year old male patient presenting today for one-year follow-up to get refills of Symbicort 160.  Patient is doing well.  Will refill medications.  Patient can follow-up in 1 year or sooner if breathing or symptoms worsen.  Patient is up-to-date with his flu vaccine.  COPD GOLD II Continue Symbicort 160 >>> 2 puffs in the morning right when you wake up, rinse out your mouth after use, 12 hours later 2 puffs, rinse after use >>> Take this daily, no matter what >>> This is not a rescue inhaler  >>>refilled prescription    Chronic respiratory failure assoc with chf/ PAH  Continue oxygen therapy as prescribed >>>Check oxygen levels sporadically to ensure your SPO2 is remaining greater than 90%  If you are unable to maintain oxygen saturations greater than 90% on your current oxygen  please let us know  Follow-up in 1 year, or sooner if symptoms worsen or you have concerns regarding your breathing   Health care maintenance Up-to-date with flu vaccine  Follow-up in 1 year, or sooner if symptoms worsen or you have concerns regarding your breathing  Keep follow-up with cardiology Keep follow up with [place], NP 05/09/2018

## 2018-05-09 ENCOUNTER — Encounter: Payer: Self-pay | Admitting: Pulmonary Disease

## 2018-05-09 ENCOUNTER — Ambulatory Visit (INDEPENDENT_AMBULATORY_CARE_PROVIDER_SITE_OTHER): Payer: Medicare Other | Admitting: Pulmonary Disease

## 2018-05-09 DIAGNOSIS — J449 Chronic obstructive pulmonary disease, unspecified: Secondary | ICD-10-CM

## 2018-05-09 DIAGNOSIS — J9611 Chronic respiratory failure with hypoxia: Secondary | ICD-10-CM

## 2018-05-09 DIAGNOSIS — Z0000 Encounter for general adult medical examination without abnormal findings: Secondary | ICD-10-CM

## 2018-05-09 MED ORDER — BUDESONIDE-FORMOTEROL FUMARATE 160-4.5 MCG/ACT IN AERO: 2.0000 | INHALATION_SPRAY | Freq: Two times a day (BID) | RESPIRATORY_TRACT | 0 refills | Status: DC

## 2018-05-09 MED ORDER — BUDESONIDE-FORMOTEROL FUMARATE 160-4.5 MCG/ACT IN AERO: INHALATION_SPRAY | RESPIRATORY_TRACT | 6 refills | Status: DC

## 2018-05-09 NOTE — Assessment & Plan Note (Signed)
Up-to-date with flu vaccine  Follow-up in 1 year, or sooner if symptoms worsen or you have concerns regarding your breathing  Keep follow-up with cardiology Keep follow up with Primary Care

## 2018-05-09 NOTE — Addendum Note (Signed)
Addended by: Amado Coe on: 05/09/2018 10:11 AM   Modules accepted: Orders

## 2018-05-09 NOTE — Assessment & Plan Note (Signed)
  Continue oxygen therapy as prescribed >>>Check oxygen levels sporadically to ensure your SPO2 is remaining greater than 90%  If you are unable to maintain oxygen saturations greater than 90% on your current oxygen please let us know  Follow-up in 1 year, or sooner if symptoms worsen or you have concerns regarding your breathing

## 2018-05-09 NOTE — Assessment & Plan Note (Signed)
Continue Symbicort 160 >>> 2 puffs in the morning right when you wake up, rinse out your mouth after use, 12 hours later 2 puffs, rinse after use >>> Take this daily, no matter what >>> This is not a rescue inhaler  >>>refilled prescription

## 2018-05-09 NOTE — Patient Instructions (Signed)
Continue Symbicort 160 >>> 2 puffs in the morning right when you wake up, rinse out your mouth after use, 12 hours later 2 puffs, rinse after use >>> Take this daily, no matter what >>> This is not a rescue inhaler  >>>refilled prescription   Continue oxygen therapy as prescribed >>>Check oxygen levels sporadically to ensure your SPO2 is remaining greater than 90%  If you are unable to maintain oxygen saturations greater than 90% on your current oxygen please let us know  Follow-up in 1 year, or sooner if symptoms worsen or you have concerns regarding your breathing  Keep follow-up with cardiology Keep follow up with Primary Care    November/2019 we will be moving! We will no longer be at our [place] location.  Be on the look out for a post card/mailer to let you know we have officially moved.  Our new address and phone number will be:  [address] [place], [place] [address] Telephone number: [phone]  It is flu season:   >>>Remember to be washing your hands regularly, using hand sanitizer, be careful to use around herself with has contact with people who are sick will increase her chances of getting sick yourself. >>> Best ways to protect herself from the flu: Receive the yearly flu vaccine, practice good hand hygiene washing with soap and also using hand sanitizer when available, eat a nutritious meals, get adequate rest, hydrate appropriately   Please contact the office if your symptoms worsen or you have concerns that you are not improving.   Thank you for choosing [facility] for your healthcare, and for allowing [place] to partner with you on your healthcare journey. I am thankful to be able to provide care to you today.   Wyn Quaker FNP-C

## 2018-05-10 DIAGNOSIS — D485 Neoplasm of uncertain behavior of skin: Secondary | ICD-10-CM | POA: Diagnosis not present

## 2018-05-10 DIAGNOSIS — D045 Carcinoma in situ of skin of trunk: Secondary | ICD-10-CM | POA: Diagnosis not present

## 2018-05-10 DIAGNOSIS — D1801 Hemangioma of skin and subcutaneous tissue: Secondary | ICD-10-CM | POA: Diagnosis not present

## 2018-05-10 DIAGNOSIS — L570 Actinic keratosis: Secondary | ICD-10-CM | POA: Diagnosis not present

## 2018-05-10 DIAGNOSIS — L738 Other specified follicular disorders: Secondary | ICD-10-CM | POA: Diagnosis not present

## 2018-05-10 DIAGNOSIS — D692 Other nonthrombocytopenic purpura: Secondary | ICD-10-CM | POA: Diagnosis not present

## 2018-05-15 ENCOUNTER — Encounter: Payer: Self-pay | Admitting: Gastroenterology

## 2018-05-26 ENCOUNTER — Other Ambulatory Visit: Payer: Self-pay | Admitting: Adult Health

## 2018-05-28 NOTE — Telephone Encounter (Signed)
DENIED.  FILLED ON 04/06/18 FOR 90 DAYS.  REQUEST IS EARLY.

## 2018-05-30 ENCOUNTER — Ambulatory Visit (INDEPENDENT_AMBULATORY_CARE_PROVIDER_SITE_OTHER): Payer: Medicare Other

## 2018-05-30 DIAGNOSIS — I428 Other cardiomyopathies: Secondary | ICD-10-CM | POA: Diagnosis not present

## 2018-05-31 NOTE — Progress Notes (Signed)
Remote ICD transmission.   

## 2018-06-26 DIAGNOSIS — D485 Neoplasm of uncertain behavior of skin: Secondary | ICD-10-CM | POA: Diagnosis not present

## 2018-06-26 DIAGNOSIS — D045 Carcinoma in situ of skin of trunk: Secondary | ICD-10-CM | POA: Diagnosis not present

## 2018-06-26 DIAGNOSIS — C44722 Squamous cell carcinoma of skin of right lower limb, including hip: Secondary | ICD-10-CM | POA: Diagnosis not present

## 2018-06-26 DIAGNOSIS — C44712 Basal cell carcinoma of skin of right lower limb, including hip: Secondary | ICD-10-CM | POA: Diagnosis not present

## 2018-07-01 ENCOUNTER — Other Ambulatory Visit: Payer: Self-pay | Admitting: Adult Health

## 2018-07-01 DIAGNOSIS — I10 Essential (primary) hypertension: Secondary | ICD-10-CM

## 2018-07-02 NOTE — Telephone Encounter (Signed)
Sent to the pharmacy by e-scribe. 

## 2018-07-11 DIAGNOSIS — J189 Pneumonia, unspecified organism: Secondary | ICD-10-CM

## 2018-07-11 HISTORY — DX: Pneumonia, unspecified organism: J18.9

## 2018-07-22 IMAGING — CT CT CHEST W/O CM
2 of 4 series · 15 of 36 positions shown, 18 images · non-contrast
Comparison: CT chest 08/12/2015

CLINICAL DATA: Restaging left lung cancer. Initial diagnosis 6641.
Status post partial left upper lobe lobectomy.

EXAM:
CT CHEST WITHOUT CONTRAST
TECHNIQUE: Multidetector CT imaging of the chest was performed following the
standard protocol without IV contrast.

[Series 2: thorax · axial · 0.86mm/px · z∈[+1458,+1768]mm · 12 of 181 slices shown, 15 images]
[im 13/181  mediastinal]
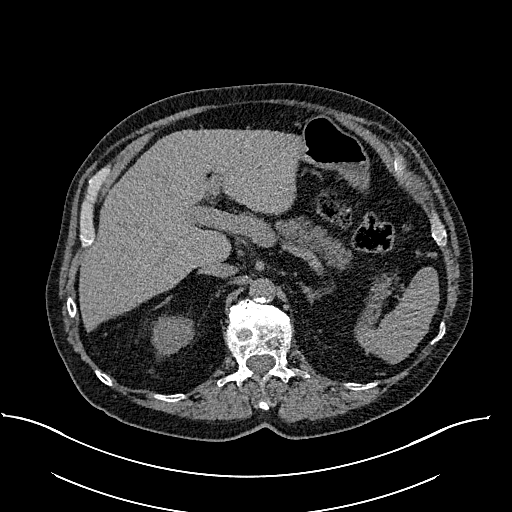
[im 13/181  lung]
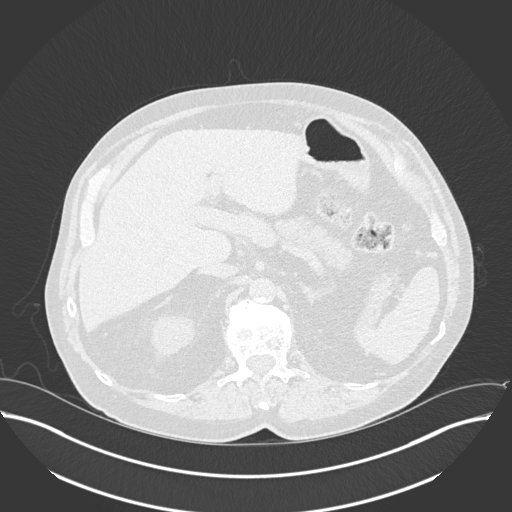
[im 26/181  lung]
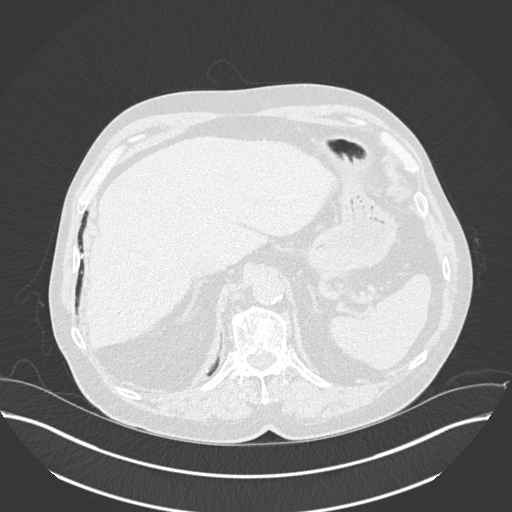
[im 39/181  lung]
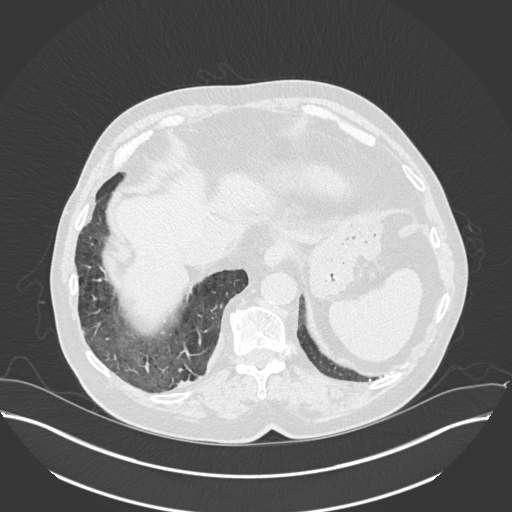
[im 52/181  lung]
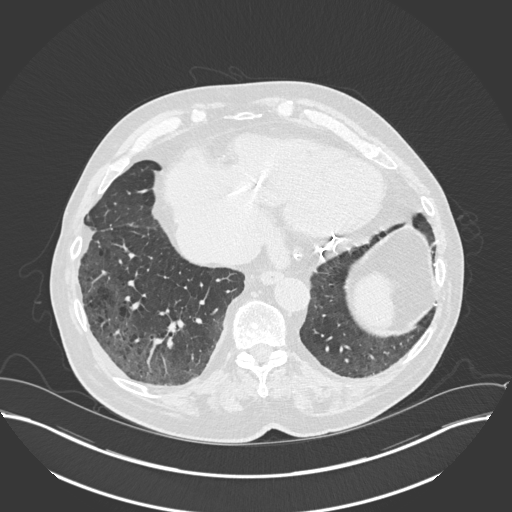
[im 65/181  mediastinal]
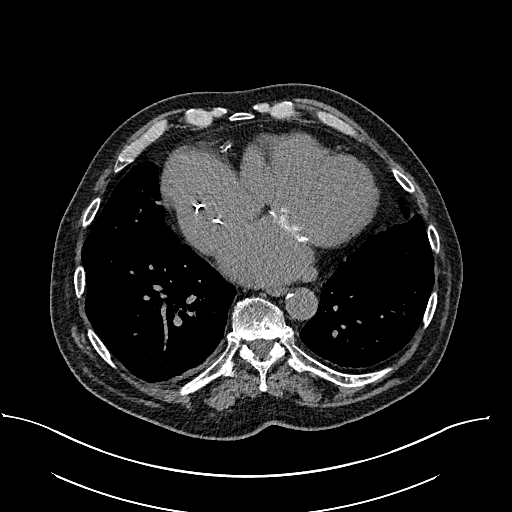
[im 65/181  lung]
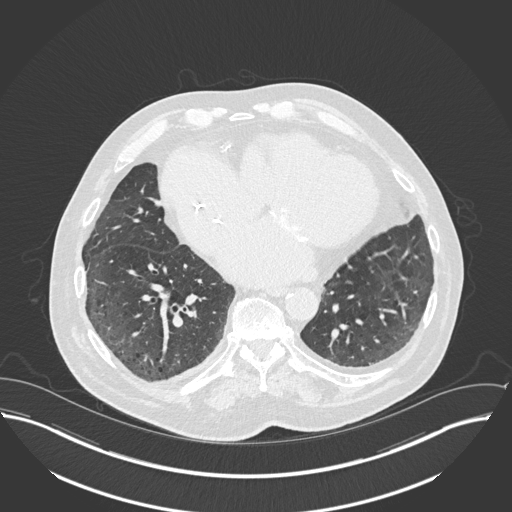
[im 78/181  lung]
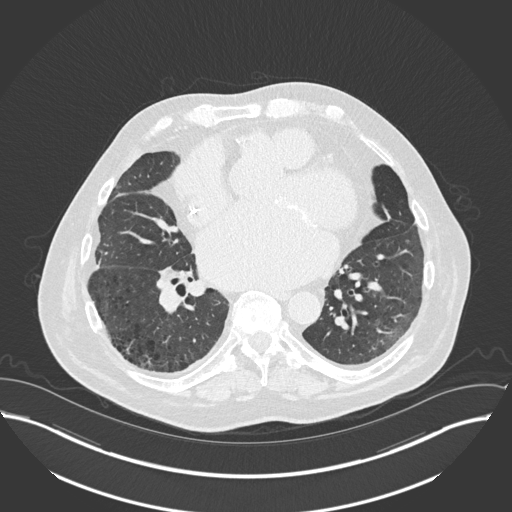
[im 103/181  lung]
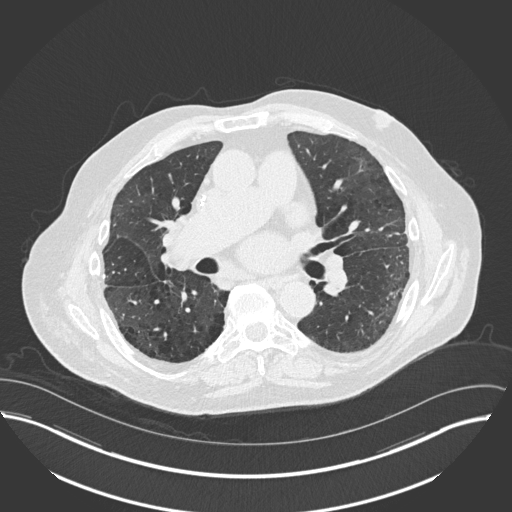
[im 116/181  lung]
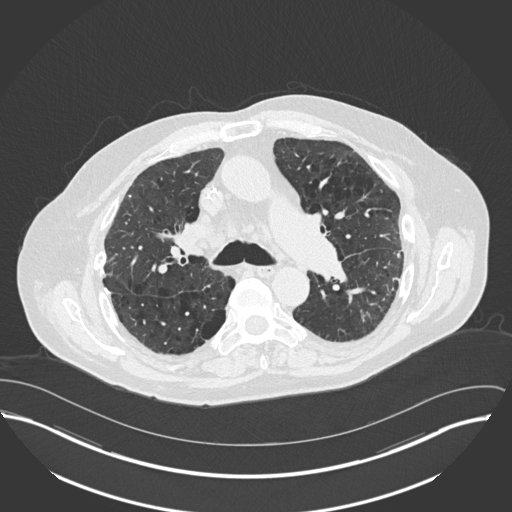
[im 129/181  mediastinal]
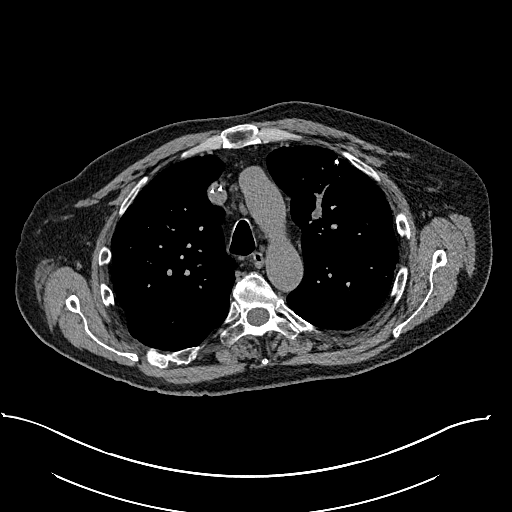
[im 129/181  lung]
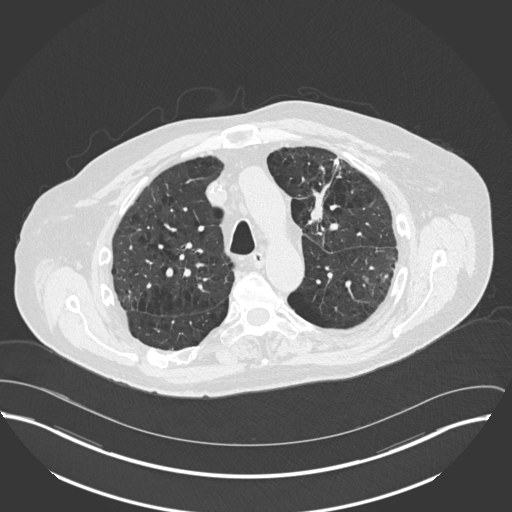
[im 142/181  lung]
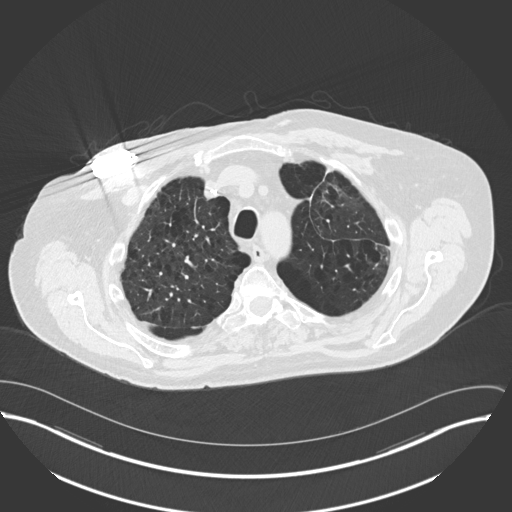
[im 155/181  lung]
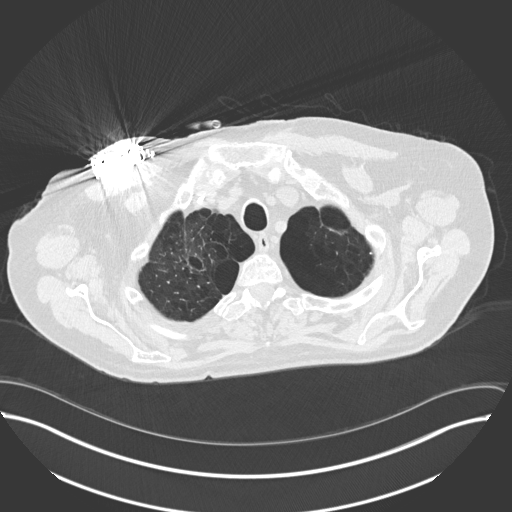
[im 168/181  lung]
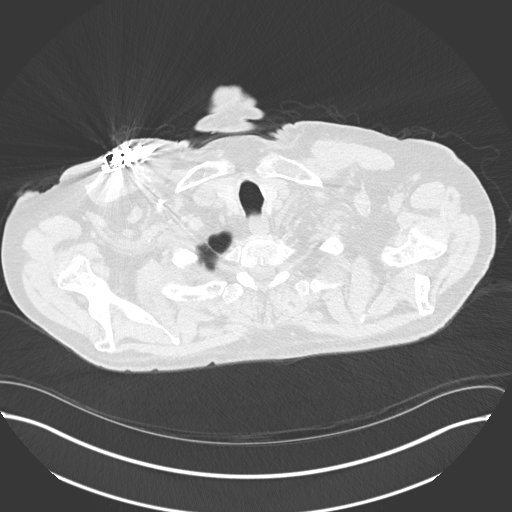

[Series 6: coronal · coronal · 0.76mm/px · 3 of 151 slices shown]
[im 31/151  lung]
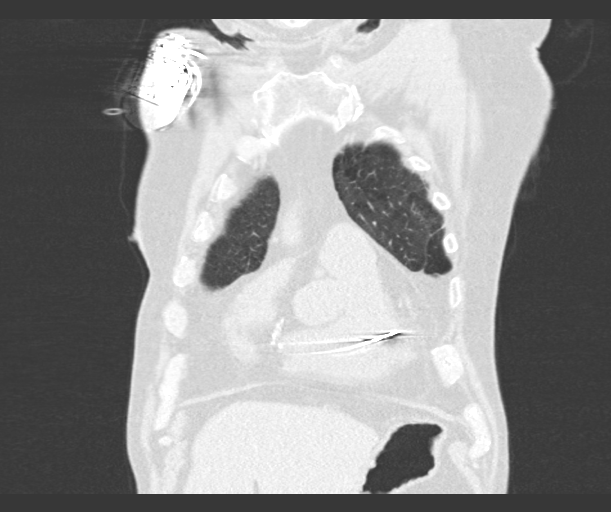
[im 61/151  lung]
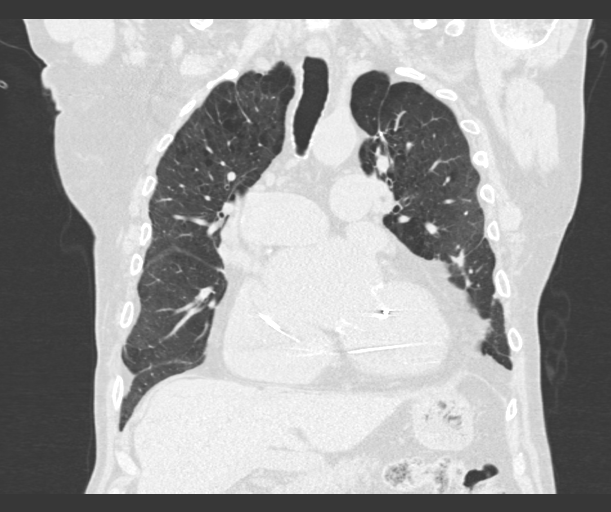
[im 91/151  lung]
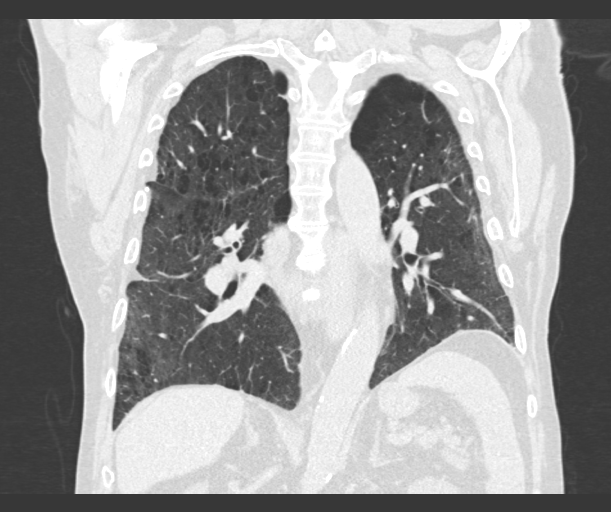

[15 of 36 positions shown; findings below may reference images not displayed]

FINDINGS: Cardiovascular: The heart is enlarged but appears stable. Multi
chamber enlargement but most notably the right and left atria.
Stable mild tortuosity and atherosclerotic calcifications involving
the abdominal aorta. Stable three-vessel coronary artery
calcifications. Stable enlarged pulmonary artery suggesting
pulmonary hypertension.

Mediastinum/Nodes: No mediastinal or hilar mass or lymphadenopathy.
Persistent borderline enlarged aorticopulmonary window and
subcarinal lymph nodes. Largest subcarinal node measures 13.5 mm on
image number 77. This measures 12.5 mm on the prior study. The
esophagus is grossly normal.

Lungs/Pleura: Stable advanced emphysematous changes and pulmonary
scarring.

Stable surgical scarring changes involving the left upper lobe
related to a prior partial lobectomy/ wedge resection. No findings
for recurrent tumor. No worrisome pulmonary nodules to suggest
pulmonary metastatic disease. Stable 2 mm left upper lobe pulmonary
nodule on image number 52. No acute overlying pulmonary findings. No
pleural effusion. Stable clustered peripheral left lower lobe
parenchymal calcifications and small scattered calcified granulomas.

Upper Abdomen: No significant upper abdominal findings. Irregular
liver contour or may suggest early changes of cirrhosis. No adrenal
gland or hepatic metastatic lesions are identified. Stable abdominal
aortic calcifications.

Chest wall/ Musculoskeletal: No chest wall lesions are identified.
Stable left-sided gynecomastia. Stable right-sided permanent
pacemaker. No supraclavicular or axillary adenopathy.

No significant bony findings.
IMPRESSION: 1. Stable surgical changes involving the left upper lobe. No
evidence of recurrent tumor.
2. Stable borderline enlarged mediastinal lymph nodes.
3. Stable cardiac enlargement most notably marked enlargement of the
right atrium and left atrium.
4. Enlarged pulmonary artery suggesting pulmonary hypertension.
5. Stable advanced emphysematous changes, pulmonary scarring and
tiny pulmonary nodules.
6. No acute overlying pulmonary process.

Aortic Atherosclerosis (XIJT9-5MB.B) and Emphysema ([id number].X).

## 2018-07-25 ENCOUNTER — Telehealth: Payer: Self-pay

## 2018-07-25 DIAGNOSIS — K7460 Unspecified cirrhosis of liver: Secondary | ICD-10-CM

## 2018-07-25 NOTE — Telephone Encounter (Signed)
Lab order has been added to Epic.    You have been scheduled for an abdominal ultrasound at [facility] (1st floor of hospital) on 08/01/18 at 9 am. Please arrive 15 minutes prior to your appointment for registration. Make certain not to have anything to eat or drink 6 hours prior to your appointment. Should you need to reschedule your appointment, please contact radiology at [phone]. This test typically takes about 30 minutes to perform.  The patient has been notified of this information and all questions answered. The pt has been advised of the information and verbalized understanding.

## 2018-07-25 NOTE — Telephone Encounter (Signed)
-----   Message from Timothy Lasso, RN sent at 07/18/2018  8:36 AM EST -----  ----- Message ----- From: Timothy Lasso, RN Sent: 07/18/2018 To: Timothy Lasso, RN   ----- Message ----- From: Timothy Lasso, RN Sent: 07/12/2018 To: Timothy Lasso, RN   ----- Message ----- From: Timothy Lasso, RN Sent: 07/04/2018 To: Timothy Lasso, RN  He needs AFP and [place] in 6 months for hepatoma screening

## 2018-07-26 ENCOUNTER — Ambulatory Visit (INDEPENDENT_AMBULATORY_CARE_PROVIDER_SITE_OTHER): Payer: Medicare Other

## 2018-07-26 ENCOUNTER — Ambulatory Visit (INDEPENDENT_AMBULATORY_CARE_PROVIDER_SITE_OTHER): Payer: Medicare Other | Admitting: Adult Health

## 2018-07-26 ENCOUNTER — Encounter: Payer: Self-pay | Admitting: Adult Health

## 2018-07-26 VITALS — BP 110/50 | HR 74 | Temp 97.7°F | Wt 173.0 lb

## 2018-07-26 DIAGNOSIS — J069 Acute upper respiratory infection, unspecified: Secondary | ICD-10-CM | POA: Diagnosis not present

## 2018-07-26 DIAGNOSIS — J449 Chronic obstructive pulmonary disease, unspecified: Secondary | ICD-10-CM | POA: Diagnosis not present

## 2018-07-26 LAB — CUP PACEART REMOTE DEVICE CHECK
Battery Remaining Longevity: 30 mo
Date Time Interrogation Session: 20191120090017
HIGH POWER IMPEDANCE MEASURED VALUE: 71 Ohm
HighPow Impedance: 71 Ohm
Implantable Lead Implant Date: 20100113
Implantable Lead Location: 753860
Lead Channel Impedance Value: 940 Ohm
Lead Channel Pacing Threshold Amplitude: 1.125 V
Lead Channel Pacing Threshold Pulse Width: 0.5 ms
Lead Channel Pacing Threshold Pulse Width: 0.5 ms
Lead Channel Setting Sensing Sensitivity: 0.5 mV
MDC IDC LEAD IMPLANT DT: 20100113
MDC IDC LEAD LOCATION: 753858
MDC IDC MSMT BATTERY REMAINING PERCENTAGE: 32 %
MDC IDC MSMT BATTERY VOLTAGE: 2.89 V
MDC IDC MSMT LEADCHNL RV IMPEDANCE VALUE: 380 Ohm
MDC IDC MSMT LEADCHNL RV PACING THRESHOLD AMPLITUDE: 0.5 V
MDC IDC MSMT LEADCHNL RV SENSING INTR AMPL: 11.9 mV
MDC IDC PG IMPLANT DT: 20130729
MDC IDC PG SERIAL: 7053988
MDC IDC SET LEADCHNL LV PACING AMPLITUDE: 2.125
MDC IDC SET LEADCHNL LV PACING PULSEWIDTH: 0.5 ms
MDC IDC SET LEADCHNL RV PACING AMPLITUDE: 2 V
MDC IDC SET LEADCHNL RV PACING PULSEWIDTH: 0.5 ms

## 2018-07-26 MED ORDER — PREDNISONE 10 MG PO TABS: 10.0000 mg | ORAL_TABLET | Freq: Every day | ORAL | 0 refills | Status: DC

## 2018-07-26 MED ORDER — DOXYCYCLINE HYCLATE 100 MG PO CAPS: 100.0000 mg | ORAL_CAPSULE | Freq: Two times a day (BID) | ORAL | 0 refills | Status: DC

## 2018-07-26 NOTE — Progress Notes (Signed)
Subjective:    Patient ID: [name], male    DOB: June 11, 1946, 73 y.o.   MRN: [id number]  History of lung CA with wedge resection  on chronic oxygen therapy.   URI   This is a new problem. The current episode started 1 to 4 weeks ago (10 days ). The problem has been gradually worsening. There has been no fever. Associated symptoms include congestion, coughing (productive) and wheezing. Pertinent negatives include no chest pain, ear pain, headaches, joint swelling, plugged ear sensation, rhinorrhea, sinus pain, sore throat or swollen glands. He has tried decongestant for the symptoms. The treatment provided no relief.      Review of Systems  Constitutional: Negative.   HENT: Positive for congestion. Negative for ear pain, rhinorrhea, sinus pressure, sinus pain and sore throat.   Respiratory: Positive for cough (productive), shortness of breath and wheezing.   Cardiovascular: Negative for chest pain.  Musculoskeletal: Negative.   Neurological: Negative for headaches.  Psychiatric/Behavioral: Negative.    Past Medical History:  Diagnosis Date  . Atrial fibrillation ([place])    AV Node ablation January, 2010, for rapid atrial fib  . Atrial septal defect    Closed with surgery January, 2010  . Automatic implantable cardioverter-defibrillator in situ    LV dysfunction and pacer needed for AV node lesion  . Cardiomyopathy    non-ischemic  . CHF (congestive heart failure) ([place])   . Chronic combined systolic and diastolic CHF (congestive heart failure) ([place])   . Colon polyps   . COPD (chronic obstructive pulmonary disease) (HCC)    O2- 2 liters, nasal cannula, q night   . COPD GOLD II 01/11/2007   PFT's 09/25/13  FEV1  2.18 (61%) ratio 64 no change p B2 and DLC0  32% corrects to 68% - trial off advair and acei rec starting  08/23/2013     . CVA (cerebral vascular accident) ([place]) 2009   denies residual on 08/14/2013  . Diabetes mellitus without complication ([place])    type 2  .  Dyslipidemia   . Dysrhythmia   . Ejection fraction < 50%   . Endocarditis    Bacterial, 2009  . Headache(784.0)    related to stroke only  . HLD (hyperlipidemia)   . Hypertension   . Intracranial hemorrhage (HCC)    Coumadin cannot be used because of the history of his bleed  . Lung cancer ([place]) 11/29/2013   T1N0 Stage Ia non-small cell carcinoma left lung treated with wedge resection  . Mitral regurgitation    Severe symptomatic primary MR due to bacterial endocarditis, treated w/ MVR // Echo 1/18 EF 40-45, diffuse HK, dilated aorta at 42 mm/aortic root 47 mm; linear echogenic structure in ascending aorta - suspect reverberation artifact / consider CT or TEE to rule out dissection flap, bioprosthetic MVR with mean gradient 3, severe LAE, low normal RVSF, severe RAE, moderate TR, PASP 42  . Myocardial infarction ([place]) 2010  . Pacemaker    combo pacer and icd  . Permanent atrial fibrillation    Originally Coumadin use for atrial fibrillation  //   he had intracerebral hemorrhage with an INR of 2.3 June, 2009. Anticoagulation could no longer be used.  //  Rapid atrial fibrillation after inferior MI October, 2010..........Marland Kitchen AV node ablation done at that time with ICD pacemaker placed (EF 35%).   //   Left atrial appendage tied off at the time of mitral valve surgery January, 2010 (maze pro  . Prosthetic valve dysfunction  Mild mitral stenosis  . Pulmonary hypertension (HCC)    Moderate  . Renal artery stenosis (HCC)    Mild by history  . 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 Spontaneous pneumothorax    right thoracotomy - distant past  . Status post minimally invasive mitral valve replacement with bioprosthetic valve    33 mm Medtronic Mosaic porcine bioprosthesis placed via right mini thoracotomy for bacterial endocarditis complicated by severe MR and CHF   . Thoracic aortic aneurysm ([place]) 08/11/2016   a - Chest CTA 1/18:  Aneurysmal dilatation of aortic root is noted at 5.1 cm.      Social History   Socioeconomic History  . Marital status: Married    Spouse name: [name]  . Number of children: 0  . Years of education: College  . Highest education level: Not on file  Occupational History  . 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 Financial resource strain: Not on file  . Food insecurity:    Worry: Not on file    Inability: Not on file  . Transportation needs:    Medical: Not on file    Non-medical: Not on file  Tobacco Use  . Smoking status: Former Smoker    Packs/day: 1.00    Years: 45.00    Pack years: 45.00    Types: Cigarettes    Last attempt to quit: 08/11/2013    Years since quitting: 4.9  . Smokeless tobacco: Never Used  . Tobacco comment: 08/14/2013 "quit smoking in 2009"  Substance and Sexual Activity  . Alcohol use: No    Alcohol/week: 0.0 standard drinks    Comment: 08/14/2013 "used to drink beer; quit:in 1982"  . Drug use: No  . Sexual activity: Yes  Lifestyle  . Physical activity:    Days per week: Not on file    Minutes per session: Not on file  . Stress: Not on file  Relationships  . Social connections:    Talks on phone: Not on file    Gets together: Not on file    Attends religious service: Not on file    Active member of club or organization: Not on file    Attends meetings of clubs or organizations: Not on file    Relationship status: Not on file  . Intimate partner violence:    Fear of current or ex partner: Not on file    Emotionally abused: Not on file    Physically abused: Not on file    Forced sexual activity: Not on file  Other Topics Concern  . Not on file  Social History Narrative   Patient lives at home with his spouse.   Caffeine Use: none   Worked for the [employer]   Has two boys and a girl. All live local.     Past Surgical History:  Procedure Laterality Date  . APPENDECTOMY    . ASD REPAIR, SECUNDUM  07/17/2008   pericardial patch closure of ASD  . AV NODE  ABLATION  07/2008   for rapid atrial fib  . CARDIAC CATHETERIZATION    . CARDIAC DEFIBRILLATOR PLACEMENT  ~ [address] [place]  . CARDIAC VALVE REPLACEMENT    . CATARACT EXTRACTION W/ INTRAOCULAR LENS  IMPLANT, BILATERAL Bilateral   . ESOPHAGOGASTRODUODENOSCOPY (EGD) WITH PROPOFOL N/A 09/29/2016   Procedure: ESOPHAGOGASTRODUODENOSCOPY (EGD) WITH PROPOFOL;  Surgeon: [name], MD;  Location: [facility] ENDOSCOPY;  Service: Endoscopy;  Laterality: N/A;  . HERNIA REPAIR    . IMPLANTABLE CARDIOVERTER DEFIBRILLATOR (ICD) GENERATOR CHANGE N/A 02/06/2012   Procedure: ICD GENERATOR CHANGE;  Surgeon: [name], MD;  Location: [facility] CATH LAB;  Service: Cardiovascular;  Laterality: N/A;  . INSERT / REPLACE / REMOVE PACEMAKER    . MASS BIOPSY Left    neck mass  . MITRAL VALVE REPLACEMENT Right 07/17/2008   1mm Medtronic Mosaic porcine bioprosthesis  . PENILE PROSTHESIS IMPLANT    . RIGHT HEART CATHETERIZATION N/A 08/16/2013   Procedure: RIGHT HEART CATH;  Surgeon: Josue Hector, MD;  Location: [facility] CATH LAB;  Service: Cardiovascular;  Laterality: N/A;  . TEE WITHOUT CARDIOVERSION N/A 09/06/2016   Procedure: TRANSESOPHAGEAL ECHOCARDIOGRAM (TEE);  Surgeon: Dorothy Spark, MD;  Location: [facility] ENDOSCOPY;  Service: Cardiovascular;  Laterality: N/A;  . THORACOTOMY Right 1970's   spontaneous pneumothorax - while in the [place]  . TOE SURGERY     left foot hammer toe  . TONSILLECTOMY    . VIDEO ASSISTED THORACOSCOPY (VATS)/WEDGE RESECTION Left 11/29/2013   Procedure: Video assisted thoracoscopy for wedge resection; mini thoracotomy;  Surgeon: [name], MD;  Location: [facility] OR;  Service: Thoracic;  Laterality: Left;    Family History  Problem Relation Age of Onset  . Stomach cancer Father   . Stroke Mother     Allergies  Allergen Reactions  . Anticoagulant Compound Other (See Comments)    Pt had intracranial bleed, therefore all anticoagulation is contra-indicated per Dr. Ron Parker.  . Warfarin Sodium Other  (See Comments)    Pt had intracranial bleed, therefore all anticoagulation is contra-indicated per Dr. Ron Parker.     Current Outpatient Medications on File Prior to Visit  Medication Sig Dispense Refill  . acetaminophen (TYLENOL) 500 MG tablet Take 500-1,000 mg by mouth every 6 (six) hours as needed for moderate pain or headache.    Marland Kitchen aspirin EC 81 MG tablet Take 1 tablet (81 mg total) by mouth daily. 90 tablet 3  . atorvastatin (LIPITOR) 10 MG tablet TAKE 1 TABLET BY MOUTH EVERY DAY 90 tablet 3  . budesonide-formoterol (SYMBICORT) 160-4.5 MCG/ACT inhaler Inhale 2 puffs into the lungs 2 (two) times daily. 1 Inhaler 0  . budesonide-formoterol (SYMBICORT) 160-4.5 MCG/ACT inhaler INHALE 2 PUFFS INTO THE LUNGS TWICE A DAY 10.2 Inhaler 6  . carvedilol (COREG) 3.125 MG tablet TAKE 1 TABLET BY MOUTH TWICE A DAY WITH MEALS 180 tablet 1  . furosemide (LASIX) 40 MG tablet TAKE 1 TABLET BY MOUTH EVERY DAY 90 tablet 3  . metFORMIN (GLUCOPHAGE) 1000 MG tablet TAKE 1 TABLET BY MOUTH TWICE A DAY WITH MEALS 180 tablet 1  . nitroGLYCERIN (NITROSTAT) 0.4 MG SL tablet Place 0.4 mg under the tongue every 5 (five) minutes as needed for chest pain (x 3 doses). Reported on 12/29/2015    . Olopatadine HCl (PAZEO) 0.7 % SOLN Apply 1 drop to eye daily. 1 Bottle 0  . OXYGEN Inhale 2 L/min into the lungs continuous.    . valsartan (DIOVAN) 40 MG tablet TAKE 1 TABLET BY MOUTH EVERY DAY 90 tablet 2  . amoxicillin (AMOXIL) 500 MG capsule Take 2,000 mg by mouth as directed. 1 hour prior to dental procedures     No current facility-administered medications on file prior to visit.     BP (!) 110/50   Pulse 74   Temp 97.7 F (36.5 C)   Wt 173 lb (78.5 kg)   SpO2 (!) 80%   BMI 22.82  kg/m       Objective:   Physical Exam Vitals signs and nursing note reviewed.  Constitutional:      Appearance: Normal appearance.  HENT:     Right Ear: Tympanic membrane, ear canal and external ear normal.     Left Ear: Tympanic  membrane, ear canal and external ear normal.     Nose: Nose normal.     Mouth/Throat:     Mouth: Mucous membranes are moist.     Pharynx: Oropharynx is clear.  Cardiovascular:     Rate and Rhythm: Normal rate and regular rhythm.     Pulses: Normal pulses.     Heart sounds: Normal heart sounds.  Pulmonary:     Breath sounds: Rhonchi and rales present.  Neurological:     General: No focal deficit present.     Mental Status: He is alert and oriented to person, place, and time.  Psychiatric:        Mood and Affect: Mood normal.        Behavior: Behavior normal.        Thought Content: Thought content normal.        Judgment: Judgment normal.       Assessment & Plan:  1. Upper respiratory tract infection, unspecified type - Will treat due to time duration and PMH.  - Advised close follow up if no improvement over the next 48 hours or sooner if symptoms worsen or fever develops  - DG Chest 2 View; Future - doxycycline (VIBRAMYCIN) 100 MG capsule; Take 1 capsule (100 mg total) by mouth 2 (two) times daily.  Dispense: 14 capsule; Refill: 0 - predniSONE (DELTASONE) 10 MG tablet; Take 1 tablet (10 mg total) by mouth daily with breakfast.  Dispense: 7 tablet; Refill: 0  [name], NP

## 2018-07-26 NOTE — Patient Instructions (Signed)
It was great seeing you today and I am sorry you are feeling bad.   I have sent in a prescription for doxycycline and prednisone   I have also ordered a chest xray and will call you when I get the results

## 2018-07-30 ENCOUNTER — Encounter (HOSPITAL_COMMUNITY): Payer: Self-pay | Admitting: *Deleted

## 2018-07-30 ENCOUNTER — Inpatient Hospital Stay (HOSPITAL_COMMUNITY)
Admission: EM | Admit: 2018-07-30 | Discharge: 2018-08-02 | DRG: 193 | Disposition: A | Discharge status: Home / Self Care | Payer: Medicare Other | Attending: Family Medicine | Admitting: Family Medicine

## 2018-07-30 ENCOUNTER — Telehealth: Payer: Self-pay | Admitting: Gastroenterology

## 2018-07-30 ENCOUNTER — Emergency Department (HOSPITAL_COMMUNITY): Payer: Medicare Other

## 2018-07-30 DIAGNOSIS — I4821 Permanent atrial fibrillation: Secondary | ICD-10-CM | POA: Diagnosis present

## 2018-07-30 DIAGNOSIS — T380X5A Adverse effect of glucocorticoids and synthetic analogues, initial encounter: Secondary | ICD-10-CM | POA: Diagnosis present

## 2018-07-30 DIAGNOSIS — J440 Chronic obstructive pulmonary disease with acute lower respiratory infection: Secondary | ICD-10-CM | POA: Diagnosis present

## 2018-07-30 DIAGNOSIS — I110 Hypertensive heart disease with heart failure: Secondary | ICD-10-CM | POA: Diagnosis present

## 2018-07-30 DIAGNOSIS — Z87891 Personal history of nicotine dependence: Secondary | ICD-10-CM

## 2018-07-30 DIAGNOSIS — G8929 Other chronic pain: Secondary | ICD-10-CM | POA: Diagnosis present

## 2018-07-30 DIAGNOSIS — Z952 Presence of prosthetic heart valve: Secondary | ICD-10-CM

## 2018-07-30 DIAGNOSIS — Z8673 Personal history of transient ischemic attack (TIA), and cerebral infarction without residual deficits: Secondary | ICD-10-CM | POA: Diagnosis not present

## 2018-07-30 DIAGNOSIS — I252 Old myocardial infarction: Secondary | ICD-10-CM

## 2018-07-30 DIAGNOSIS — J441 Chronic obstructive pulmonary disease with (acute) exacerbation: Secondary | ICD-10-CM | POA: Diagnosis present

## 2018-07-30 DIAGNOSIS — J811 Chronic pulmonary edema: Secondary | ICD-10-CM | POA: Diagnosis present

## 2018-07-30 DIAGNOSIS — E785 Hyperlipidemia, unspecified: Secondary | ICD-10-CM | POA: Diagnosis present

## 2018-07-30 DIAGNOSIS — I351 Nonrheumatic aortic (valve) insufficiency: Secondary | ICD-10-CM | POA: Diagnosis not present

## 2018-07-30 DIAGNOSIS — I5042 Chronic combined systolic (congestive) and diastolic (congestive) heart failure: Secondary | ICD-10-CM | POA: Diagnosis present

## 2018-07-30 DIAGNOSIS — K219 Gastro-esophageal reflux disease without esophagitis: Secondary | ICD-10-CM | POA: Diagnosis present

## 2018-07-30 DIAGNOSIS — J449 Chronic obstructive pulmonary disease, unspecified: Secondary | ICD-10-CM | POA: Diagnosis not present

## 2018-07-30 DIAGNOSIS — I2510 Atherosclerotic heart disease of native coronary artery without angina pectoris: Secondary | ICD-10-CM | POA: Diagnosis present

## 2018-07-30 DIAGNOSIS — J181 Lobar pneumonia, unspecified organism: Secondary | ICD-10-CM | POA: Diagnosis not present

## 2018-07-30 DIAGNOSIS — Z95810 Presence of automatic (implantable) cardiac defibrillator: Secondary | ICD-10-CM

## 2018-07-30 DIAGNOSIS — J189 Pneumonia, unspecified organism: Secondary | ICD-10-CM

## 2018-07-30 DIAGNOSIS — Z7982 Long term (current) use of aspirin: Secondary | ICD-10-CM

## 2018-07-30 DIAGNOSIS — I272 Pulmonary hypertension, unspecified: Secondary | ICD-10-CM | POA: Diagnosis present

## 2018-07-30 DIAGNOSIS — Z7952 Long term (current) use of systemic steroids: Secondary | ICD-10-CM

## 2018-07-30 DIAGNOSIS — I10 Essential (primary) hypertension: Secondary | ICD-10-CM | POA: Diagnosis present

## 2018-07-30 DIAGNOSIS — Z66 Do not resuscitate: Secondary | ICD-10-CM | POA: Diagnosis present

## 2018-07-30 DIAGNOSIS — I428 Other cardiomyopathies: Secondary | ICD-10-CM | POA: Diagnosis present

## 2018-07-30 DIAGNOSIS — K2960 Other gastritis without bleeding: Secondary | ICD-10-CM | POA: Diagnosis present

## 2018-07-30 DIAGNOSIS — R9430 Abnormal result of cardiovascular function study, unspecified: Secondary | ICD-10-CM | POA: Diagnosis present

## 2018-07-30 DIAGNOSIS — I152 Hypertension secondary to endocrine disorders: Secondary | ICD-10-CM | POA: Diagnosis present

## 2018-07-30 DIAGNOSIS — I5022 Chronic systolic (congestive) heart failure: Secondary | ICD-10-CM | POA: Diagnosis present

## 2018-07-30 DIAGNOSIS — J9621 Acute and chronic respiratory failure with hypoxia: Secondary | ICD-10-CM | POA: Diagnosis present

## 2018-07-30 DIAGNOSIS — E119 Type 2 diabetes mellitus without complications: Secondary | ICD-10-CM | POA: Diagnosis not present

## 2018-07-30 DIAGNOSIS — J9611 Chronic respiratory failure with hypoxia: Secondary | ICD-10-CM | POA: Diagnosis not present

## 2018-07-30 DIAGNOSIS — Z9981 Dependence on supplemental oxygen: Secondary | ICD-10-CM | POA: Diagnosis not present

## 2018-07-30 DIAGNOSIS — E1165 Type 2 diabetes mellitus with hyperglycemia: Secondary | ICD-10-CM | POA: Diagnosis present

## 2018-07-30 DIAGNOSIS — Z7951 Long term (current) use of inhaled steroids: Secondary | ICD-10-CM

## 2018-07-30 DIAGNOSIS — C3412 Malignant neoplasm of upper lobe, left bronchus or lung: Secondary | ICD-10-CM | POA: Diagnosis present

## 2018-07-30 DIAGNOSIS — R0789 Other chest pain: Secondary | ICD-10-CM | POA: Diagnosis not present

## 2018-07-30 DIAGNOSIS — Z800 Family history of malignant neoplasm of digestive organs: Secondary | ICD-10-CM

## 2018-07-30 DIAGNOSIS — R0902 Hypoxemia: Secondary | ICD-10-CM | POA: Diagnosis not present

## 2018-07-30 DIAGNOSIS — Z823 Family history of stroke: Secondary | ICD-10-CM

## 2018-07-30 DIAGNOSIS — I4510 Unspecified right bundle-branch block: Secondary | ICD-10-CM | POA: Diagnosis not present

## 2018-07-30 DIAGNOSIS — R079 Chest pain, unspecified: Secondary | ICD-10-CM | POA: Diagnosis not present

## 2018-07-30 DIAGNOSIS — R231 Pallor: Secondary | ICD-10-CM | POA: Diagnosis not present

## 2018-07-30 DIAGNOSIS — I712 Thoracic aortic aneurysm, without rupture: Secondary | ICD-10-CM | POA: Diagnosis present

## 2018-07-30 DIAGNOSIS — J9610 Chronic respiratory failure, unspecified whether with hypoxia or hypercapnia: Secondary | ICD-10-CM | POA: Diagnosis present

## 2018-07-30 LAB — BASIC METABOLIC PANEL
Anion gap: 12 (ref 5–15)
BUN: 20 mg/dL (ref 8–23)
CALCIUM: 8.8 mg/dL — AB (ref 8.9–10.3)
CO2: 25 mmol/L (ref 22–32)
Chloride: 102 mmol/L (ref 98–111)
Creatinine, Ser: 1.2 mg/dL (ref 0.61–1.24)
GFR calc Af Amer: >60 mL/min (ref >60)
GLUCOSE: 170 mg/dL — AB (ref 70–99)
Potassium: 4.4 mmol/L (ref 3.5–5.1)
Sodium: 139 mmol/L (ref 135–145)

## 2018-07-30 LAB — CBC
HCT: 40.6 % (ref 39.0–52.0)
HEMOGLOBIN: 12.1 g/dL — AB (ref 13.0–17.0)
MCH: 26.2 pg (ref 26.0–34.0)
MCHC: 29.8 g/dL — AB (ref 30.0–36.0)
MCV: 87.9 fL (ref 80.0–100.0)
Platelets: 213 10*3/uL (ref 150–400)
RBC: 4.62 MIL/uL (ref 4.22–5.81)
RDW: 17.3 % — ABNORMAL HIGH (ref 11.5–15.5)
WBC: 8.2 10*3/uL (ref 4.0–10.5)
nRBC: 0 % (ref 0.0–0.2)

## 2018-07-30 LAB — I-STAT TROPONIN, ED: TROPONIN I, POC: 0.01 ng/mL (ref 0.00–0.08)

## 2018-07-30 LAB — BRAIN NATRIURETIC PEPTIDE: B NATRIURETIC PEPTIDE 5: 437.2 pg/mL — AB (ref 0.0–100.0)

## 2018-07-30 LAB — I-STAT CG4 LACTIC ACID, ED: Lactic Acid, Venous: 1.31 mmol/L (ref 0.5–1.9)

## 2018-07-30 MED ORDER — SODIUM CHLORIDE 0.9 % IV SOLN
500.0000 mg | INTRAVENOUS | Status: DC
  Administered 2018-07-31: 500 mg via INTRAVENOUS
  Filled 2018-07-30: qty 500

## 2018-07-30 MED ORDER — METFORMIN HCL 500 MG PO TABS: 1000.0000 mg | ORAL_TABLET | Freq: Two times a day (BID) | ORAL | Status: DC

## 2018-07-30 MED ORDER — ACETAMINOPHEN 500 MG PO TABS
500.0000 mg | ORAL_TABLET | Freq: Four times a day (QID) | ORAL | Status: DC | PRN
  Administered 2018-08-01 – 2018-08-02 (×2): 1000 mg via ORAL
  Filled 2018-07-30 (×2): qty 2

## 2018-07-30 MED ORDER — INSULIN ASPART 100 UNIT/ML ~~LOC~~ SOLN
0.0000 [IU] | Freq: Three times a day (TID) | SUBCUTANEOUS | Status: DC
  Administered 2018-07-31 – 2018-08-01 (×4): 5 [IU] via SUBCUTANEOUS
  Administered 2018-08-01 (×2): 3 [IU] via SUBCUTANEOUS
  Administered 2018-08-02: 7 [IU] via SUBCUTANEOUS
  Administered 2018-08-02: 1 [IU] via SUBCUTANEOUS

## 2018-07-30 MED ORDER — OLOPATADINE HCL 0.1 % OP SOLN
1.0000 [drp] | Freq: Every day | OPHTHALMIC | Status: DC
  Administered 2018-08-01 – 2018-08-02 (×2): 1 [drp] via OPHTHALMIC
  Filled 2018-07-30: qty 5

## 2018-07-30 MED ORDER — METHYLPREDNISOLONE SODIUM SUCC 125 MG IJ SOLR
125.0000 mg | Freq: Two times a day (BID) | INTRAMUSCULAR | Status: DC
  Administered 2018-07-30 – 2018-07-31 (×2): 125 mg via INTRAVENOUS
  Filled 2018-07-30 (×2): qty 2

## 2018-07-30 MED ORDER — SODIUM CHLORIDE 0.9 % IV SOLN
1.0000 g | Freq: Once | INTRAVENOUS | Status: AC
  Administered 2018-07-30: 1 g via INTRAVENOUS
  Filled 2018-07-30: qty 10

## 2018-07-30 MED ORDER — MORPHINE SULFATE (PF) 2 MG/ML IV SOLN
2.0000 mg | Freq: Once | INTRAVENOUS | Status: AC
  Administered 2018-07-30: 2 mg via INTRAVENOUS
  Filled 2018-07-30: qty 1

## 2018-07-30 MED ORDER — ASPIRIN EC 81 MG PO TBEC
81.0000 mg | DELAYED_RELEASE_TABLET | Freq: Every day | ORAL | Status: DC
  Administered 2018-07-31 – 2018-08-02 (×3): 81 mg via ORAL
  Filled 2018-07-30 (×3): qty 1

## 2018-07-30 MED ORDER — AZITHROMYCIN 250 MG PO TABS
500.0000 mg | ORAL_TABLET | Freq: Once | ORAL | Status: AC
  Administered 2018-07-30: 500 mg via ORAL
  Filled 2018-07-30: qty 2

## 2018-07-30 MED ORDER — ATORVASTATIN CALCIUM 10 MG PO TABS
10.0000 mg | ORAL_TABLET | Freq: Every day | ORAL | Status: DC
  Administered 2018-07-30 – 2018-08-01 (×3): 10 mg via ORAL
  Filled 2018-07-30 (×3): qty 1

## 2018-07-30 MED ORDER — SODIUM CHLORIDE 0.9 % IV SOLN
1.0000 g | INTRAVENOUS | Status: DC
  Administered 2018-07-31: 1 g via INTRAVENOUS
  Filled 2018-07-30: qty 10

## 2018-07-30 MED ORDER — IRBESARTAN 150 MG PO TABS: 75.0000 mg | ORAL_TABLET | Freq: Every day | ORAL | Status: DC

## 2018-07-30 MED ORDER — NITROGLYCERIN 0.4 MG SL SUBL: 0.4000 mg | SUBLINGUAL_TABLET | SUBLINGUAL | Status: DC | PRN

## 2018-07-30 MED ORDER — FUROSEMIDE 40 MG PO TABS
40.0000 mg | ORAL_TABLET | Freq: Every day | ORAL | Status: DC
  Administered 2018-07-31 – 2018-08-02 (×3): 40 mg via ORAL
  Filled 2018-07-30 (×3): qty 1

## 2018-07-30 MED ORDER — IOPAMIDOL (ISOVUE-370) INJECTION 76%
INTRAVENOUS | Status: AC
  Filled 2018-07-30: qty 100

## 2018-07-30 MED ORDER — ENOXAPARIN SODIUM 40 MG/0.4ML ~~LOC~~ SOLN
40.0000 mg | SUBCUTANEOUS | Status: DC
  Administered 2018-07-31 – 2018-08-02 (×3): 40 mg via SUBCUTANEOUS
  Filled 2018-07-30 (×3): qty 0.4

## 2018-07-30 MED ORDER — SODIUM CHLORIDE 0.9% FLUSH: 3.0000 mL | Freq: Once | INTRAVENOUS | Status: DC

## 2018-07-30 MED ORDER — CARVEDILOL 3.125 MG PO TABS
3.1250 mg | ORAL_TABLET | Freq: Two times a day (BID) | ORAL | Status: DC
  Administered 2018-07-30 – 2018-08-02 (×6): 3.125 mg via ORAL
  Filled 2018-07-30 (×6): qty 1

## 2018-07-30 MED ORDER — FENTANYL CITRATE (PF) 100 MCG/2ML IJ SOLN
50.0000 ug | Freq: Once | INTRAMUSCULAR | Status: AC
  Administered 2018-07-30: 50 ug via INTRAVENOUS
  Filled 2018-07-30: qty 2

## 2018-07-30 NOTE — H&P (Signed)
History and Physical  [name] QQV:[id number] DOB: 12-Jun-1946 DOA: 07/30/2018   Ward, Ozella Almond, PA-C      PCP: [name], NP  Outpatient Specialists:  Patient coming from: Home & is able to ambulate   Chief Complaint: Chest pain midsternal  HPI: [name] is a 73 y.o. male with medical history significant for atrial fibrillation, CHF, COPD, type 2 diabetes mellitus, hyperlipidemia, nonischemic cardiomyopathy, colonic polyp, chronic respiratory failure on 2 L/min at bedtime, prior MI inferior MI in 2009, who presents to the emergency department with complaint of chest pain that began at 9 AM this morning pain was constant and tight in nature there is central part of his chest with intermittent radiation to his neck he feels like this is similar pain to his previous heart attack.  He called EMS and they gave him 325 mg of aspirin and he also received 1 nitroglycerin which did not change his level of pain.  He stated that he still has about 8/10 chest pain but he is looking comfortably I asked him with the pain he said is in the midsternal he points to the midsternal he is looking very calm and comfortable however..  As a result of the nitroglycerin his blood pressure drops to 300s for second dose was not given he denies any shortness of breath he is typically on 2 L/min of O2 at home but it had to be increased to 4 L when he arrived in the emergency room he has some upper respiratory infection or cold last week and still has cough and nasal congestion now denies any fever.  He was seen by his primary doctor on his January 16 and he was started on doxycycline and prednisone but that has not helped.   ED Course: Chest x-ray shows that he has bilateral pulmonary edema and pneumonia.  He was noted to have oxygen level of 74 so his O2 was increased from 2 L/min to 4 L/min he is doing well now  Review of Systems: Patient seen  . Pt complains of midsternal chest pain, cough and upper  respiratory infection symptoms Pt denies any denies shortness of breath fever chills nausea vomiting.  Review of systems are otherwise negative   Past Medical History:  Diagnosis Date  . Atrial fibrillation ([place])    AV Node ablation January, 2010, for rapid atrial fib  . Atrial septal defect    Closed with surgery January, 2010  . Automatic implantable cardioverter-defibrillator in situ    LV dysfunction and pacer needed for AV node lesion  . Cardiomyopathy    non-ischemic  . CHF (congestive heart failure) ([place])   . Chronic combined systolic and diastolic CHF (congestive heart failure) ([place])   . Colon polyps   . COPD (chronic obstructive pulmonary disease) (HCC)    O2- 2 liters, nasal cannula, q night   . COPD GOLD II 01/11/2007   PFT's 09/25/13  FEV1  2.18 (61%) ratio 64 no change p B2 and DLC0  32% corrects to 68% - trial off advair and acei rec starting  08/23/2013     . CVA (cerebral vascular accident) ([place]) 2009   denies residual on 08/14/2013  . Diabetes mellitus without complication ([place])    type 2  . Dyslipidemia   . Dysrhythmia   . Ejection fraction < 50%   . Endocarditis    Bacterial, 2009  . Headache(784.0)    related to stroke only  . HLD (hyperlipidemia)   . Hypertension   .  Intracranial hemorrhage (HCC)    Coumadin cannot be used because of the history of his bleed  . Lung cancer ([place]) 11/29/2013   T1N0 Stage Ia non-small cell carcinoma left lung treated with wedge resection  . Mitral regurgitation    Severe symptomatic primary MR due to bacterial endocarditis, treated w/ MVR // Echo 1/18 EF 40-45, diffuse HK, dilated aorta at 42 mm/aortic root 47 mm; linear echogenic structure in ascending aorta - suspect reverberation artifact / consider CT or TEE to rule out dissection flap, bioprosthetic MVR with mean gradient 3, severe LAE, low normal RVSF, severe RAE, moderate TR, PASP 42  . Myocardial infarction ([place]) 2010  . Pacemaker    combo pacer and icd  . Permanent  atrial fibrillation    Originally Coumadin use for atrial fibrillation  //   he had intracerebral hemorrhage with an INR of 2.3 June, 2009. Anticoagulation could no longer be used.  //  Rapid atrial fibrillation after inferior MI October, 2010..........Marland Kitchen AV node ablation done at that time with ICD pacemaker placed (EF 35%).   //   Left atrial appendage tied off at the time of mitral valve surgery January, 2010 (maze pro  . Prosthetic valve dysfunction    Mild mitral stenosis  . Pulmonary hypertension (HCC)    Moderate  . Renal artery stenosis (HCC)    Mild by history  . 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 Spontaneous pneumothorax    right thoracotomy - distant past  . Status post minimally invasive mitral valve replacement with bioprosthetic valve    33 mm Medtronic Mosaic porcine bioprosthesis placed via right mini thoracotomy for bacterial endocarditis complicated by severe MR and CHF   . Thoracic aortic aneurysm ([place]) 08/11/2016   a - Chest CTA 1/18:  Aneurysmal dilatation of aortic root is noted at 5.1 cm.    Past Surgical History:  Procedure Laterality Date  . APPENDECTOMY    . ASD REPAIR, SECUNDUM  07/17/2008   pericardial patch closure of ASD  . AV NODE ABLATION  07/2008   for rapid atrial fib  . CARDIAC CATHETERIZATION    . CARDIAC DEFIBRILLATOR PLACEMENT  ~ 75 [place] [employer]  . CARDIAC VALVE REPLACEMENT    . CATARACT EXTRACTION W/ INTRAOCULAR LENS  IMPLANT, BILATERAL Bilateral   . ESOPHAGOGASTRODUODENOSCOPY (EGD) WITH PROPOFOL N/A 09/29/2016   Procedure: ESOPHAGOGASTRODUODENOSCOPY (EGD) WITH PROPOFOL;  Surgeon: [name], MD;  Location: [facility] ENDOSCOPY;  Service: Endoscopy;  Laterality: N/A;  . HERNIA REPAIR    . IMPLANTABLE CARDIOVERTER DEFIBRILLATOR (ICD) GENERATOR CHANGE N/A 02/06/2012   Procedure: ICD GENERATOR CHANGE;  Surgeon: [name], MD;  Location: [facility] C[facility] LAB;  Service: Cardiovascular;  Laterality: N/A;  . INSERT / REPLACE / REMOVE PACEMAKER    . MASS BIOPSY  Left    neck mass  . MITRAL VALVE REPLACEMENT Right 07/17/2008   56mm Medtronic Mosaic porcine bioprosthesis  . PENILE PROSTHESIS IMPLANT    . RIGHT HEART CATHETERIZATION N/A 08/16/2013   Procedure: RIGHT HEART CATH;  Surgeon: Josue Hector, MD;  Location: [facility] CATH LAB;  Service: Cardiovascular;  Laterality: N/A;  . TEE WITHOUT CARDIOVERSION N/A 09/06/2016   Procedure: TRANSESOPHAGEAL ECHOCARDIOGRAM (TEE);  Surgeon: Dorothy Spark, MD;  Location: [facility] ENDOSCOPY;  Service: Cardiovascular;  Laterality: N/A;  . THORACOTOMY Right 1970's   spontaneous pneumothorax - while in the [place]  . TOE SURGERY     left foot hammer toe  . TONSILLECTOMY    . VIDEO  ASSISTED THORACOSCOPY (VATS)/WEDGE RESECTION Left 11/29/2013   Procedure: Video assisted thoracoscopy for wedge resection; mini thoracotomy;  Surgeon: [name], MD;  Location: [place];  Service: Thoracic;  Laterality: Left;    Social History:  reports that he quit smoking about 4 years ago. His smoking use included cigarettes. He has a 45.00 pack-year smoking history. He has never used smokeless tobacco. He reports that he does not drink alcohol or use drugs.   Allergies  Allergen Reactions  . Anticoagulant Compound Other (See Comments)    Pt had intracranial bleed, therefore all anticoagulation is contra-indicated per Dr. Ron Parker.  . Warfarin Sodium Other (See Comments)    Pt had intracranial bleed, therefore all anticoagulation is contra-indicated per Dr. Ron Parker.     Family History  Problem Relation Age of Onset  . Stomach cancer Father   . Stroke Mother       Prior to Admission medications   Medication Sig Start Date End Date Taking? Authorizing Provider  acetaminophen (TYLENOL) 500 MG tablet Take 500-1,000 mg by mouth every 6 (six) hours as needed for moderate pain or headache.    [provider]  amoxicillin (AMOXIL) 500 MG capsule Take 2,000 mg by mouth as directed. 1 hour prior to dental procedures    [provider]  aspirin EC 81 MG tablet Take 1 tablet (81 mg total) by mouth daily. 12/07/12   Carlena Bjornstad, MD  atorvastatin (LIPITOR) 10 MG tablet TAKE 1 TABLET BY MOUTH EVERY DAY Patient taking differently: Take 10 mg by mouth daily at 6 PM.  04/18/18   Nafziger, Tommi Rumps, NP  budesonide-formoterol (SYMBICORT) 160-4.5 MCG/ACT inhaler Inhale 2 puffs into the lungs 2 (two) times daily. 05/09/18   Lauraine Rinne, NP  budesonide-formoterol (SYMBICORT) 160-4.5 MCG/ACT inhaler INHALE 2 PUFFS INTO THE LUNGS TWICE A DAY Patient taking differently: Inhale 2 puffs into the lungs 2 (two) times daily.  05/09/18   Lauraine Rinne, NP  carvedilol (COREG) 3.125 MG tablet TAKE 1 TABLET BY MOUTH TWICE A DAY WITH MEALS Patient taking differently: Take 3.125 mg by mouth 2 (two) times daily with a meal.  10/12/16   Nafziger, Tommi Rumps, NP  doxycycline (VIBRAMYCIN) 100 MG capsule Take 1 capsule (100 mg total) by mouth 2 (two) times daily. 07/26/18   Nafziger, Tommi Rumps, NP  furosemide (LASIX) 40 MG tablet TAKE 1 TABLET BY MOUTH EVERY DAY Patient taking differently: Take 40 mg by mouth daily.  04/18/18   Nafziger, Tommi Rumps, NP  metFORMIN (GLUCOPHAGE) 1000 MG tablet TAKE 1 TABLET BY MOUTH TWICE A DAY WITH MEALS Patient taking differently: Take 1,000 mg by mouth 2 (two) times daily with a meal.  04/16/18   Nafziger, Tommi Rumps, NP  nitroGLYCERIN (NITROSTAT) 0.4 MG SL tablet Place 0.4 mg under the tongue every 5 (five) minutes as needed for chest pain (x 3 doses). Reported on 12/29/2015    [provider]  Olopatadine HCl (PAZEO) 0.7 % SOLN Apply 1 drop to eye daily. 01/03/18   [place]ique, Betty G, MD  OXYGEN Inhale 2 L/min into the lungs continuous.    [provider]  predniSONE (DELTASONE) 10 MG tablet Take 1 tablet (10 mg total) by mouth daily with breakfast. 07/26/18   Nafziger, Tommi Rumps, NP  valsartan (DIOVAN) 40 MG tablet TAKE 1 TABLET BY MOUTH EVERY DAY Patient taking differently: Take 40 mg by mouth daily.  07/02/18   [name],  NP    Physical Exam: BP (!) 95/56   Pulse 74  Temp 98.4 F (36.9 C) (Oral)   Resp 16   SpO2 95%   Exam:  . General: 73 y.o. year-old male well developed well nourished in no acute distress.  Alert and oriented x3. . Cardiovascular: Regular rate and rhythm with no rubs or gallops.  No thyromegaly or JVD noted.  A defibrillator is noted on the right upper chest wall . Respiratory: Clear to auscultation with no wheezes or rales breath sounds in the lower lung fields. Good inspiratory effort. . Abdomen: Soft nontender nondistended with normal bowel sounds x4 quadrants. . Musculoskeletal: No lower extremity edema. 2/4 pulses in all 4 extremities. . Skin: No ulcerative lesions noted or rashes, . Psychiatry: Mood is appropriate for condition and setting           Labs on Admission:  Basic Metabolic Panel: Recent Labs  Lab 07/30/18 1115  NA 139  K 4.4  CL 102  CO2 25  GLUCOSE 170*  BUN 20  CREATININE 1.20  CALCIUM 8.8*   Liver Function Tests: No results for input(s): AST, ALT, ALKPHOS, BILITOT, PROT, ALBUMIN in the last 168 hours. No results for input(s): LIPASE, AMYLASE in the last 168 hours. No results for input(s): AMMONIA in the last 168 hours. CBC: Recent Labs  Lab 07/30/18 1115  WBC 8.2  HGB 12.1*  HCT 40.6  MCV 87.9  PLT 213   Cardiac Enzymes: No results for input(s): CKTOTAL, CKMB, CKMBINDEX, TROPONINI in the last 168 hours.  BNP (last 3 results) Recent Labs    07/30/18 1330  BNP 437.2*    ProBNP (last 3 results) No results for input(s): PROBNP in the last 8760 hours.  CBG: No results for input(s): GLUCAP in the last 168 hours.  Radiological Exams on Admission: Dg Chest 2 View  Result Date: 07/30/2018 CLINICAL DATA:  Chest pain EXAM: CHEST - 2 VIEW COMPARISON:  07/26/2018 FINDINGS: Stable cardiomegaly with chronic appearing vascular congestion. Worsening asymmetric mixed interstitial and airspace disease, more consolidated in the bases,  difficult to exclude multilobar pneumonia. Asymmetric alveolar edema could have a similar appearance. Trace pleural effusions suspected. Background COPD/emphysema noted. No pneumothorax. Right subclavian AICD present. IMPRESSION: Cardiomegaly with chronic COPD and worsening mixed interstitial/alveolar opacities. Suspect a combination of bilateral edema and pneumonia particularly in the lower lobes. Trace pleural effusions Electronically Signed   By: Jerilynn Mages.  Shick M.D.   On: 07/30/2018 12:33    EKG: Independently reviewed.  Sinus rhythm right bundle branch block with old inferior infarct  Assessment/Plan Present on Admission: . COPD GOLD II . GERD . Hypertension . Dyslipidemia . Permanent atrial fibrillation . Ejection fraction < 50% . ICD (implantable cardioverter-defibrillator), biventricular, in situ . Chronic systolic CHF (congestive heart failure) ([place]) . Chronic combined systolic and diastolic CHF (congestive heart failure) ([place]) . Chronic respiratory failure assoc with chf/ PAH . Primary cancer of left upper lobe of lung ([place]) . Pulmonary edema  Principal Problem:   Community acquired pneumonia Active Problems:   Primary cancer of left upper lobe of lung (HCC)   COPD GOLD II   GERD   Hypertension   Dyslipidemia   Permanent atrial fibrillation   Ejection fraction < 50%   ICD (implantable cardioverter-defibrillator), biventricular, in situ   Chronic systolic CHF (congestive heart failure) (HCC)   Chronic respiratory failure assoc with chf/ PAH   H/O: CVA (cerebrovascular accident)   Cardiomyopathy, nonischemic (HCC)   Diabetes ([place])   Chronic combined systolic and diastolic CHF (congestive heart failure) ([place])   Pulmonary edema   #  1  Community-acquired pneumonia he failed outpatient therapy with oral doxycycline and prednisone.  Patient will be started on IV Zithromax and ceftriaxone.  Blood culture is pending  2.  COPD we will continue with Solu-Medrol.  3.  Bilateral  pulmonary edema IV Lasix has been started  4.  Chest pain with history of coronary artery disease he will be ruled out for MI by EKG and enzyme criteria his troponins are reassuring so far.  Most likely pleurisy and pain secondary to the pneumonia  5.  Acute on chronic respiratory failure continue oxygen therapy  6.  Diabetes mildly uncontrolled sliding scale insulin has been started    Severity of Illness: The appropriate patient status for this patient is INPATIENT. Inpatient status is judged to be reasonable and necessary in order to provide the required intensity of service to ensure the patient's safety. The patient's presenting symptoms, physical exam findings, and initial radiographic and laboratory data in the context of their chronic comorbidities is felt to place them at high risk for further clinical deterioration. Furthermore, it is not anticipated that the patient will be medically stable for discharge from the hospital within 2 midnights of admission. The following factors support the patient status of inpatient.   " The patient's presenting symptoms include chest pain with cough and congestion. " The worrisome physical exam findings include hypoxia of 72 on 2 L/min. " The initial radiographic and laboratory data are worrisome because of pneumonia failed outpatient oral antibiotics. " The chronic co-morbidities include COPD.   * I certify that at the point of admission it is my clinical judgment that the patient will require inpatient hospital care spanning beyond 2 midnights from the point of admission due to high intensity of service, high risk for further deterioration and high frequency of surveillance required.*    DVT prophylaxis: Lovenox  Code Status: DNR  Family Communication: None at bedside  Disposition Plan: When improved  Consults called: None  Admission status: Patient    Cristal Deer MD [facility] Pager [phone]  If 7PM-7AM, please  contact night-coverage [email or web address] Password [email or web address]  07/30/2018, 4:06 PM

## 2018-07-30 NOTE — ED Triage Notes (Signed)
Pt in from home via [facility], per report pt had non radiating CP onset today at 9am, pt has [place] Pacemaker, pt ventricular paced per EMS, pt O2 sats 90% on 2L, pt uses 2L [place] @ baseline, pt placed on 4L [place] with O2 sats improving to 95%, pt reports recent cold like symptoms, pt rcvd 324 mg ASA pta, pt rcvd 1 sL nitro with no reported pain improvement, pt sys dropped 10 pts with nitro per EMS

## 2018-07-30 NOTE — Telephone Encounter (Signed)
Patients wife states patient is at [place] with pneumonia and needs to cancel the CT for this Wednesday 1.22.2020.

## 2018-07-30 NOTE — ED Provider Notes (Signed)
[facility] EMERGENCY DEPARTMENT Provider Note   CSN: [id number] Arrival date & time: 07/30/18  1104     History   Chief Complaint Chief Complaint  Patient presents with  . Chest Pain    HPI [name] is a 73 y.o. male.  The history is provided by the patient and medical records. No language interpreter was used.  Chest Pain  Associated symptoms: cough   Associated symptoms: no shortness of breath    [name] is a 73 y.o. male  with an extensive cardiac history including prior inferior MI in 2009, COPD, CHF, A. fib who presents to the Emergency Department complaining of central chest pain which began at 9 AM this morning.  Patient reports pain is constant tightness to the central chest which will intermittently radiate to his back.  Pain feels similar to his previous heart attack.  He was given 324 aspirin by EMS in route.  He received 1 nitroglycerin which did not change his level of pain.  EMS notes that this did drop his blood pressure to about 505 systolic, therefore a second dose was not given.  He is still having constant pain.  He denies any shortness of breath.  He is typically on 2 L of o2 at home.  He does report having a cold throughout the last week.  Just having a cough and nasal congestion.  Denies any fever or trouble breathing with this.  Seen by his primary care doctor on the 16th when he was started on doxycycline and prednisone.  Past Medical History:  Diagnosis Date  . Atrial fibrillation ([place])    AV Node ablation January, 2010, for rapid atrial fib  . Atrial septal defect    Closed with surgery January, 2010  . Automatic implantable cardioverter-defibrillator in situ    LV dysfunction and pacer needed for AV node lesion  . Cardiomyopathy    non-ischemic  . CHF (congestive heart failure) ([place])   . Chronic combined systolic and diastolic CHF (congestive heart failure) ([place])   . Colon polyps   . COPD (chronic obstructive pulmonary  disease) (HCC)    O2- 2 liters, nasal cannula, q night   . COPD GOLD II 01/11/2007   PFT's 09/25/13  FEV1  2.18 (61%) ratio 64 no change p B2 and DLC0  32% corrects to 68% - trial off advair and acei rec starting  08/23/2013     . CVA (cerebral vascular accident) ([place]) 2009   denies residual on 08/14/2013  . Diabetes mellitus without complication ([place])    type 2  . Dyslipidemia   . Dysrhythmia   . Ejection fraction < 50%   . Endocarditis    Bacterial, 2009  . Headache(784.0)    related to stroke only  . HLD (hyperlipidemia)   . Hypertension   . Intracranial hemorrhage (HCC)    Coumadin cannot be used because of the history of his bleed  . Lung cancer ([place]) 11/29/2013   T1N0 Stage Ia non-small cell carcinoma left lung treated with wedge resection  . Mitral regurgitation    Severe symptomatic primary MR due to bacterial endocarditis, treated w/ MVR // Echo 1/18 EF 40-45, diffuse HK, dilated aorta at 42 mm/aortic root 47 mm; linear echogenic structure in ascending aorta - suspect reverberation artifact / consider CT or TEE to rule out dissection flap, bioprosthetic MVR with mean gradient 3, severe LAE, low normal RVSF, severe RAE, moderate TR, PASP 42  . Myocardial infarction ([place])  2010  . Pacemaker    combo pacer and icd  . Permanent atrial fibrillation    Originally Coumadin use for atrial fibrillation  //   he had intracerebral hemorrhage with an INR of 2.3 June, 2009. Anticoagulation could no longer be used.  //  Rapid atrial fibrillation after inferior MI October, 2010..........Marland Kitchen AV node ablation done at that time with ICD pacemaker placed (EF 35%).   //   Left atrial appendage tied off at the time of mitral valve surgery January, 2010 (maze pro  . Prosthetic valve dysfunction    Mild mitral stenosis  . Pulmonary hypertension (HCC)    Moderate  . Renal artery stenosis (HCC)    Mild by history  . 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 Spontaneous pneumothorax    right thoracotomy -  distant past  . Status post minimally invasive mitral valve replacement with bioprosthetic valve    33 mm Medtronic Mosaic porcine bioprosthesis placed via right mini thoracotomy for bacterial endocarditis complicated by severe MR and CHF   . Thoracic aortic aneurysm ([place]) 08/11/2016   a - Chest CTA 1/18:  Aneurysmal dilatation of aortic root is noted at 5.1 cm.     Patient Active Problem List   Diagnosis Date Noted  . Health care maintenance 05/09/2018  . Gastritis and gastroduodenitis   . Diverticulum of duodenum   . Aortic root dilatation ([place])   . 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 Atherosclerosis of aorta ([place]) 08/11/2016  . Orthostatic hypotension 09/26/2015  . Near syncope 09/24/2015  . Syncope 09/24/2015  . Hepatic cirrhosis ([place]) 09/25/2014  . History of colonic polyps 09/25/2014  . Serum total bilirubin elevated 06/30/2014  . Follow-up examination, following unspecified surgery 01/27/2014  . Lung cancer ([place]) 11/29/2013  . Prosthetic valve dysfunction   . Chronic combined systolic and diastolic CHF (congestive heart failure) ([place])   . Diabetes ([place]) 11/04/2013  . Myocardial infarction ([place]) 11/01/2013  . Cardiomyopathy, nonischemic ([place]) 11/01/2013  . H/O intracranial hemorrhage 08/13/2013  . H/O endocarditis 08/13/2013  . H/O: CVA (cerebrovascular accident) 08/13/2013  . Chronic respiratory failure assoc with chf/ PAH 08/12/2013  . H/O atrioventricular nodal ablation   . S/P MVR (mitral valve replacement)   . Chronic systolic CHF (congestive heart failure) ([place]) 01/08/2012  . Hypertension   . Dyslipidemia   . Permanent atrial fibrillation   . Ejection fraction < 50%   . ICD (implantable cardioverter-defibrillator), biventricular, in situ   . Renal artery stenosis ([place])   . Pulmonary hypertension ([place])   . Primary cancer of left upper lobe of lung ([place]) 04/20/2009  . COLONIC POLYPS, ADENOMATOUS 03/22/2007  . COPD GOLD II 01/11/2007  . GERD 01/11/2007     Past Surgical History:  Procedure Laterality Date  . APPENDECTOMY    . ASD REPAIR, SECUNDUM  07/17/2008   pericardial patch closure of ASD  . AV NODE ABLATION  07/2008   for rapid atrial fib  . CARDIAC CATHETERIZATION    . CARDIAC DEFIBRILLATOR PLACEMENT  ~ [address] [employer]  . CARDIAC VALVE REPLACEMENT    . CATARACT EXTRACTION W/ INTRAOCULAR LENS  IMPLANT, BILATERAL Bilateral   . ESOPHAGOGASTRODUODENOSCOPY (EGD) WITH PROPOFOL N/A 09/29/2016   Procedure: ESOPHAGOGASTRODUODENOSCOPY (EGD) WITH PROPOFOL;  Surgeon: [name], MD;  Location: [facility] ENDOSCOPY;  Service: Endoscopy;  Laterality: N/A;  . HERNIA REPAIR    . IMPLANTABLE CARDIOVERTER DEFIBRILLATOR (ICD) GENERATOR CHANGE N/A 02/06/2012   Procedure: ICD GENERATOR CHANGE;  Surgeon: Carleene Overlie  Peyton Najjar, MD;  Location: [facility] CATH LAB;  Service: Cardiovascular;  Laterality: N/A;  . INSERT / REPLACE / REMOVE PACEMAKER    . MASS BIOPSY Left    neck mass  . MITRAL VALVE REPLACEMENT Right 07/17/2008   18mm Medtronic Mosaic porcine bioprosthesis  . PENILE PROSTHESIS IMPLANT    . RIGHT HEART CATHETERIZATION N/A 08/16/2013   Procedure: RIGHT HEART CATH;  Surgeon: Josue Hector, MD;  Location: [facility] CATH LAB;  Service: Cardiovascular;  Laterality: N/A;  . TEE WITHOUT CARDIOVERSION N/A 09/06/2016   Procedure: TRANSESOPHAGEAL ECHOCARDIOGRAM (TEE);  Surgeon: Dorothy Spark, MD;  Location: [facility] ENDOSCOPY;  Service: Cardiovascular;  Laterality: N/A;  . THORACOTOMY Right 1970's   spontaneous pneumothorax - while in the [facility]  . TOE SURGERY     left foot hammer toe  . TONSILLECTOMY    . VIDEO ASSISTED THORACOSCOPY (VATS)/WEDGE RESECTION Left 11/29/2013   Procedure: Video assisted thoracoscopy for wedge resection; mini thoracotomy;  Surgeon: [name], MD;  Location: [facility];  Service: Thoracic;  Laterality: Left;        Home Medications    Prior to Admission medications   Medication Sig Start Date End Date Taking? Authorizing Provider   acetaminophen (TYLENOL) 500 MG tablet Take 500-1,000 mg by mouth every 6 (six) hours as needed for moderate pain or headache.    [provider]  amoxicillin (AMOXIL) 500 MG capsule Take 2,000 mg by mouth as directed. 1 hour prior to dental procedures    [provider]  aspirin EC 81 MG tablet Take 1 tablet (81 mg total) by mouth daily. 12/07/12   Carlena Bjornstad, MD  atorvastatin (LIPITOR) 10 MG tablet TAKE 1 TABLET BY MOUTH EVERY DAY Patient taking differently: Take 10 mg by mouth daily at 6 PM.  04/18/18   Nafziger, Tommi Rumps, NP  budesonide-formoterol (SYMBICORT) 160-4.5 MCG/ACT inhaler Inhale 2 puffs into the lungs 2 (two) times daily. 05/09/18   Lauraine Rinne, NP  budesonide-formoterol (SYMBICORT) 160-4.5 MCG/ACT inhaler INHALE 2 PUFFS INTO THE LUNGS TWICE A DAY Patient taking differently: Inhale 2 puffs into the lungs 2 (two) times daily.  05/09/18   Lauraine Rinne, NP  carvedilol (COREG) 3.125 MG tablet TAKE 1 TABLET BY MOUTH TWICE A DAY WITH MEALS Patient taking differently: Take 3.125 mg by mouth 2 (two) times daily with a meal.  10/12/16   Nafziger, Tommi Rumps, NP  doxycycline (VIBRAMYCIN) 100 MG capsule Take 1 capsule (100 mg total) by mouth 2 (two) times daily. 07/26/18   Nafziger, Tommi Rumps, NP  furosemide (LASIX) 40 MG tablet TAKE 1 TABLET BY MOUTH EVERY DAY Patient taking differently: Take 40 mg by mouth daily.  04/18/18   Nafziger, Tommi Rumps, NP  metFORMIN (GLUCOPHAGE) 1000 MG tablet TAKE 1 TABLET BY MOUTH TWICE A DAY WITH MEALS Patient taking differently: Take 1,000 mg by mouth 2 (two) times daily with a meal.  04/16/18   Nafziger, Tommi Rumps, NP  nitroGLYCERIN (NITROSTAT) 0.4 MG SL tablet Place 0.4 mg under the tongue every 5 (five) minutes as needed for chest pain (x 3 doses). Reported on 12/29/2015    [provider]  Olopatadine HCl (PAZEO) 0.7 % SOLN Apply 1 drop to eye daily. 01/03/18   [place]ique, Betty G, MD  OXYGEN Inhale 2 L/min into the lungs continuous.    [provider]   predniSONE (DELTASONE) 10 MG tablet Take 1 tablet (10 mg total) by mouth daily with breakfast. 07/26/18   Nafziger, Tommi Rumps, NP  valsartan (DIOVAN)  40 MG tablet TAKE 1 TABLET BY MOUTH EVERY DAY 07/02/18   [name], NP    Family History Family History  Problem Relation Age of Onset  . Stomach cancer Father   . Stroke Mother     Social History Social History   Tobacco Use  . Smoking status: Former Smoker    Packs/day: 1.00    Years: 45.00    Pack years: 45.00    Types: Cigarettes    Last attempt to quit: 08/11/2013    Years since quitting: 4.9  . Smokeless tobacco: Never Used  . Tobacco comment: 08/14/2013 "quit smoking in 2009"  Substance Use Topics  . Alcohol use: No    Alcohol/week: 0.0 standard drinks    Comment: 08/14/2013 "used to drink beer; quit:in 1982"  . Drug use: No     Allergies   Anticoagulant compound and Warfarin sodium   Review of Systems Review of Systems  HENT: Positive for congestion.   Respiratory: Positive for cough. Negative for shortness of breath and wheezing.   Cardiovascular: Positive for chest pain.  All other systems reviewed and are negative.    Physical Exam Updated Vital Signs BP 108/60   Pulse 74   Temp 98.4 F (36.9 C) (Oral)   Resp 19   SpO2 94%   Physical Exam Vitals signs and nursing note reviewed.  Constitutional:      General: He is not in acute distress.    Appearance: He is well-developed.  HENT:     Head: Normocephalic and atraumatic.  Neck:     Musculoskeletal: Neck supple.  Cardiovascular:     Rate and Rhythm: Normal rate and regular rhythm.     Heart sounds: Normal heart sounds. No murmur.  Pulmonary:     Effort: Pulmonary effort is normal. No respiratory distress.     Breath sounds: Decreased breath sounds present.  Abdominal:     General: There is no distension.     Palpations: Abdomen is soft.     Tenderness: There is no abdominal tenderness.  Skin:    General: Skin is warm and dry.  Neurological:      Mental Status: He is alert and oriented to person, place, and time.      ED Treatments / Results  Labs (all labs ordered are listed, but only abnormal results are displayed) Labs Reviewed  BASIC METABOLIC PANEL - Abnormal; Notable for the following components:      Result Value   Glucose, Bld 170 (*)    Calcium 8.8 (*)    All other components within normal limits  CBC - Abnormal; Notable for the following components:   Hemoglobin 12.1 (*)    MCHC 29.8 (*)    RDW 17.3 (*)    All other components within normal limits  CULTURE, BLOOD (ROUTINE X 2)  CULTURE, BLOOD (ROUTINE X 2)  BRAIN NATRIURETIC PEPTIDE  I-STAT TROPONIN, ED  I-STAT CG4 LACTIC ACID, ED    EKG EKG Interpretation  Date/Time:  Monday July 30 2018 12:04:58 EST Ventricular Rate:  70 PR Interval:    QRS Duration: 149 QT Interval:  450 QTC Calculation: 486 R Axis:   -109 Text Interpretation:  Sinus rhythm Right bundle branch block Inferior infarct, old Abnormal lateral Q waves no significant change since earlier in the day Confirmed by Sherwood Gambler [phone]) on 07/30/2018 12:57:24 PM   Radiology Dg Chest 2 View  Result Date: 07/30/2018 CLINICAL DATA:  Chest pain EXAM: CHEST - 2 VIEW COMPARISON:  07/26/2018 FINDINGS: Stable cardiomegaly with chronic appearing vascular congestion. Worsening asymmetric mixed interstitial and airspace disease, more consolidated in the bases, difficult to exclude multilobar pneumonia. Asymmetric alveolar edema could have a similar appearance. Trace pleural effusions suspected. Background COPD/emphysema noted. No pneumothorax. Right subclavian AICD present. IMPRESSION: Cardiomegaly with chronic COPD and worsening mixed interstitial/alveolar opacities. Suspect a combination of bilateral edema and pneumonia particularly in the lower lobes. Trace pleural effusions Electronically Signed   By: Jerilynn Mages.  Shick M.D.   On: 07/30/2018 12:33    Procedures Procedures (including critical care  time)  Medications Ordered in ED Medications  sodium chloride flush (NS) 0.9 % injection 3 mL (has no administration in time range)  iopamidol (ISOVUE-370) 76 % injection (has no administration in time range)  cefTRIAXone (ROCEPHIN) 1 g in sodium chloride 0.9 % 100 mL IVPB (has no administration in time range)  azithromycin (ZITHROMAX) tablet 500 mg (has no administration in time range)  fentaNYL (SUBLIMAZE) injection 50 mcg (50 mcg Intravenous Given 07/30/18 1253)     Initial Impression / Assessment and Plan / ED Course  I have reviewed the triage vital signs and the nursing notes.  Pertinent labs & imaging results that were available during my care of the patient were reviewed by me and considered in my medical decision making (see chart for details).    MANUAL NAVARRA is a 73 y.o. male who presents to ED for central chest pain which began today around 9 AM.  He does have significant cardiac history.  Troponin normal and EKG without acute ischemic changes.  He has been recently treated for URI by PCP on January 16 and started on prednisone and doxycycline.  Does not feel as if his symptoms are improving.  Normally on 2 L of oxygen and now requiring 4 L to maintain sats.  Chest x-ray shows worsening mixed interstitial/alveolar opacities suspecting a combination of bilateral edema and pneumonia.  Started on azithromycin and Rocephin after blood cultures were obtained.  Hospitalist consulted who will admit.  Patient seen by and discussed with Dr. Regenia Skeeter who agrees with treatment plan.    Final Clinical Impressions(s) / ED Diagnoses   Final diagnoses:  Community acquired pneumonia, unspecified laterality    ED Discharge Orders    None       Gillian Meeuwsen, Ozella Almond, PA-C 07/30/18 1405    Sherwood Gambler, MD 07/31/18 [phone]

## 2018-07-30 NOTE — Telephone Encounter (Signed)
The [place] was cancelled due to the pt being hospitalized with pneumonia. The pt's wife was advised to call when the pt is ready to set up.

## 2018-07-31 ENCOUNTER — Other Ambulatory Visit: Payer: Self-pay

## 2018-07-31 ENCOUNTER — Encounter (HOSPITAL_COMMUNITY): Payer: Self-pay | Admitting: General Practice

## 2018-07-31 LAB — COMPREHENSIVE METABOLIC PANEL
ALT: 8 U/L (ref 0–44)
AST: 12 U/L — ABNORMAL LOW (ref 15–41)
Albumin: 3.5 g/dL (ref 3.5–5.0)
Alkaline Phosphatase: 39 U/L (ref 38–126)
Anion gap: 11 (ref 5–15)
BUN: 19 mg/dL (ref 8–23)
CALCIUM: 9.1 mg/dL (ref 8.9–10.3)
CO2: 26 mmol/L (ref 22–32)
Chloride: 102 mmol/L (ref 98–111)
Creatinine, Ser: 1.12 mg/dL (ref 0.61–1.24)
GFR calc non Af Amer: >60 mL/min (ref >60)
Glucose, Bld: 291 mg/dL — ABNORMAL HIGH (ref 70–99)
Potassium: 4.5 mmol/L (ref 3.5–5.1)
Sodium: 139 mmol/L (ref 135–145)
Total Bilirubin: 1.8 mg/dL — ABNORMAL HIGH (ref 0.3–1.2)
Total Protein: 6.6 g/dL (ref 6.5–8.1)

## 2018-07-31 LAB — CBC WITH DIFFERENTIAL/PLATELET
Abs Immature Granulocytes: 0.03 10*3/uL (ref 0.00–0.07)
Basophils Absolute: 0 10*3/uL (ref 0.0–0.1)
Basophils Relative: 0 %
EOS PCT: 2 %
Eosinophils Absolute: 0.1 10*3/uL (ref 0.0–0.5)
HCT: 39.5 % (ref 39.0–52.0)
Hemoglobin: 12 g/dL — ABNORMAL LOW (ref 13.0–17.0)
Immature Granulocytes: 1 %
Lymphocytes Relative: 3 %
Lymphs Abs: 0.2 10*3/uL — ABNORMAL LOW (ref 0.7–4.0)
MCH: 25.8 pg — AB (ref 26.0–34.0)
MCHC: 30.4 g/dL (ref 30.0–36.0)
MCV: 84.9 fL (ref 80.0–100.0)
MONO ABS: 0.1 10*3/uL (ref 0.1–1.0)
Monocytes Relative: 3 %
Neutro Abs: 4.8 10*3/uL (ref 1.7–7.7)
Neutrophils Relative %: 91 %
Platelets: 217 10*3/uL (ref 150–400)
RBC: 4.65 MIL/uL (ref 4.22–5.81)
RDW: 17.3 % — ABNORMAL HIGH (ref 11.5–15.5)
WBC: 5.2 10*3/uL (ref 4.0–10.5)
nRBC: 0 % (ref 0.0–0.2)

## 2018-07-31 LAB — LACTIC ACID, PLASMA: Lactic Acid, Venous: 1.8 mmol/L (ref 0.5–1.9)

## 2018-07-31 LAB — GLUCOSE, CAPILLARY
Glucose-Capillary: 252 mg/dL — ABNORMAL HIGH (ref 70–99)
Glucose-Capillary: 290 mg/dL — ABNORMAL HIGH (ref 70–99)
Glucose-Capillary: 273 mg/dL — ABNORMAL HIGH (ref 70–99)
Glucose-Capillary: 279 mg/dL — ABNORMAL HIGH (ref 70–99)

## 2018-07-31 LAB — TROPONIN I
Troponin I: <0.03 ng/mL (ref <0.03)
Troponin I: <0.03 ng/mL (ref <0.03)
Troponin I: <0.03 ng/mL (ref <0.03)

## 2018-07-31 LAB — HIV ANTIBODY (ROUTINE TESTING W REFLEX): HIV Screen 4th Generation wRfx: NONREACTIVE

## 2018-07-31 LAB — D-DIMER, QUANTITATIVE: D-Dimer, Quant: 1.55 ug/mL-FEU — ABNORMAL HIGH (ref 0.00–0.50)

## 2018-07-31 LAB — PROCALCITONIN: Procalcitonin: <0.1 ng/mL

## 2018-07-31 LAB — MAGNESIUM: Magnesium: 1.8 mg/dL (ref 1.7–2.4)

## 2018-07-31 MED ORDER — PANTOPRAZOLE SODIUM 40 MG PO TBEC
40.0000 mg | DELAYED_RELEASE_TABLET | Freq: Two times a day (BID) | ORAL | Status: DC
  Administered 2018-08-01 – 2018-08-02 (×3): 40 mg via ORAL
  Filled 2018-07-31 (×3): qty 1

## 2018-07-31 MED ORDER — METHYLPREDNISOLONE SODIUM SUCC 40 MG IJ SOLR
40.0000 mg | Freq: Two times a day (BID) | INTRAMUSCULAR | Status: DC
  Administered 2018-07-31: 40 mg via INTRAVENOUS
  Filled 2018-07-31: qty 1

## 2018-07-31 MED ORDER — PREDNISONE 20 MG PO TABS
50.0000 mg | ORAL_TABLET | Freq: Every day | ORAL | Status: DC
  Administered 2018-08-01: 50 mg via ORAL
  Filled 2018-07-31 (×2): qty 2

## 2018-07-31 MED ORDER — SUCRALFATE 1 GM/10ML PO SUSP
1.0000 g | Freq: Three times a day (TID) | ORAL | Status: DC
  Administered 2018-08-01 – 2018-08-02 (×3): 1 g via ORAL
  Filled 2018-07-31 (×4): qty 10

## 2018-07-31 NOTE — Progress Notes (Signed)
[facility] Progress Note  Patient: [name] CWC:[id number]   PCP: [name], NP DOB: 06-09-1946   DOA: 07/30/2018   DOS: 07/31/2018   Date of Service: the patient was seen and examined on 07/31/2018  Brief hospital course: Pt. with PMH of A. fib, CHF, COPD, type II DM, HLD, chronic systolic CHF, chronic respiratory failure,; admitted on 07/30/2018, presented with complaint of chest pain, was found to have COPD exacerbation. Currently further plan is continue further work-up for chest pain.  Subjective: Continues to have chest pain.  No nausea no vomiting no fever no chills.  Breathing is actually somewhat better but still heavy.  Cough reported.  No diarrhea.  Telemetry: Sinus tachycardia  Assessment and Plan: 1.  Chest pain. We will continue serial troponin. Order echocardiogram. Currently pain-free. Troponins are negative so far.  EKG does not show any acute STEMI. Monitor on telemetry.  2.  Community-acquired pneumonia. COPD. Patient is gastritis from doxycycline and prednisone. Presents with complaints of epigastric pain concerning for ACS. X-ray shows that the patient has a possible pneumonia. Although do not hear any crackles or wheezing on my examination. This could be pill induced gastritis. Add PPI to the regimen. Switch to oral steroids.  3.  HTN. Chronic systolic CHF. Continue Coreg.  Continue Lasix. Will ARB.  4.  Type 2 diabetes mellitus. Uncontrolled with steroid-induced hyperglycemia. Holding metformin. Continue sliding scale insulin. Taper prednisone ASAP.  Diet: cardiac diet DVT Prophylaxis: subcutaneous Heparin  Advance goals of care discussion: full code  Family Communication: nofamily was present at bedside, at the time of interview.   Disposition:  Discharge to home tomorrow.  Consultants: none Procedures: Echocardiogram pending  Scheduled Meds: . aspirin EC  81 mg Oral Daily  . atorvastatin  10 mg Oral q1800  . carvedilol   3.125 mg Oral BID WC  . enoxaparin (LOVENOX) injection  40 mg Subcutaneous Q24H  . furosemide  40 mg Oral Daily  . insulin aspart  0-9 Units Subcutaneous TID WC  . olopatadine  1 drop Both Eyes Daily  . [START ON 08/01/2018] predniSONE  50 mg Oral Q breakfast  . sodium chloride flush  3 mL Intravenous Once   Continuous Infusions: PRN Meds: acetaminophen, nitroGLYCERIN Antibiotics: Anti-infectives (From admission, onward)   Start     Dose/Rate Route Frequency Ordered Stop   07/31/18 1400  azithromycin (ZITHROMAX) 500 mg in sodium chloride 0.9 % 250 mL IVPB  Status:  Discontinued     500 mg 250 mL/hr over 60 Minutes Intravenous Every 24 hours 07/30/18 1722 07/31/18 1853   07/31/18 1000  cefTRIAXone (ROCEPHIN) 1 g in sodium chloride 0.9 % 100 mL IVPB  Status:  Discontinued     1 g 200 mL/hr over 30 Minutes Intravenous Every 24 hours 07/30/18 1722 07/31/18 1853   07/30/18 1415  cefTRIAXone (ROCEPHIN) 1 g in sodium chloride 0.9 % 100 mL IVPB     1 g 200 mL/hr over 30 Minutes Intravenous  Once 07/30/18 1402 07/30/18 1459   07/30/18 1415  azithromycin (ZITHROMAX) tablet 500 mg     500 mg Oral  Once 07/30/18 1402 07/30/18 1428       Objective: Physical Exam: Vitals:   07/30/18 1703 07/30/18 1704 07/31/18 0815 07/31/18 1656  BP:  103/67 107/67 111/66  Pulse:  75 74 77  Resp:  12 12 12   Temp:  (!) 97.5 F (36.4 C) 97.8 F (36.6 C) 97.6 F (36.4 C)  TempSrc:  Oral Oral Oral  SpO2:  90% 92% 95%  Weight: 77.6 kg     Height: 6\' 1"  (1.854 m)       Intake/Output Summary (Last 24 hours) at 07/31/2018 2011 Last data filed at 07/31/2018 1100 Gross per 24 hour  Intake 100 ml  Output -  Net 100 ml   Filed Weights   07/30/18 1703  Weight: 77.6 kg   General: Alert, Awake and Oriented to Time, Place and Person. Appear in mild distress, affect appropriate Eyes: PERRL, Conjunctiva normal ENT: Oral Mucosa clear moist. Neck: no JVD, no Abnormal Mass Or lumps Cardiovascular: S1 and S2  Present, no Murmur, Peripheral Pulses Present Respiratory: normal respiratory effort, Bilateral Air entry equal and Decreased, no use of accessory muscle, Clear to Auscultation, no Crackles, no wheezes Abdomen: Bowel Sound present, Soft and mild epigastric tenderness, no hernia Skin: no redness, no Rash, no induration Extremities: no Pedal edema, no calf tenderness Neurologic: Grossly no focal neuro deficit. Bilaterally Equal motor strength  Data Reviewed: CBC: Recent Labs  Lab 07/30/18 1115 07/31/18 0923  WBC 8.2 5.2  NEUTROABS  --  4.8  HGB 12.1* 12.0*  HCT 40.6 39.5  MCV 87.9 84.9  PLT 213 287   Basic Metabolic Panel: Recent Labs  Lab 07/30/18 1115 07/31/18 0923  NA 139 139  K 4.4 4.5  CL 102 102  CO2 25 26  GLUCOSE 170* 291*  BUN 20 19  CREATININE 1.20 1.12  CALCIUM 8.8* 9.1  MG  --  1.8    Liver Function Tests: Recent Labs  Lab 07/31/18 0923  AST 12*  ALT 8  ALKPHOS 39  BILITOT 1.8*  PROT 6.6  ALBUMIN 3.5   No results for input(s): LIPASE, AMYLASE in the last 168 hours. No results for input(s): AMMONIA in the last 168 hours. Coagulation Profile: No results for input(s): INR, PROTIME in the last 168 hours. Cardiac Enzymes: Recent Labs  Lab 07/31/18 0923 07/31/18 1556  TROPONINI <0.03 <0.03   BNP (last 3 results) No results for input(s): PROBNP in the last 8760 hours. CBG: Recent Labs  Lab 07/31/18 0815 07/31/18 1146 07/31/18 1652  GLUCAP 252* 290* 273*   Studies: No results found.   Time spent: 35 minutes  Author: Berle Mull, MD [facility] 07/31/2018 8:11 PM  Between 7PM-7AM, please contact night-coverage at [email or web address]

## 2018-08-01 ENCOUNTER — Ambulatory Visit (HOSPITAL_COMMUNITY): Payer: Medicare Other

## 2018-08-01 ENCOUNTER — Inpatient Hospital Stay (HOSPITAL_COMMUNITY): Payer: Medicare Other

## 2018-08-01 DIAGNOSIS — J441 Chronic obstructive pulmonary disease with (acute) exacerbation: Secondary | ICD-10-CM

## 2018-08-01 DIAGNOSIS — I5042 Chronic combined systolic (congestive) and diastolic (congestive) heart failure: Secondary | ICD-10-CM

## 2018-08-01 DIAGNOSIS — I428 Other cardiomyopathies: Secondary | ICD-10-CM

## 2018-08-01 DIAGNOSIS — J189 Pneumonia, unspecified organism: Secondary | ICD-10-CM

## 2018-08-01 DIAGNOSIS — E119 Type 2 diabetes mellitus without complications: Secondary | ICD-10-CM

## 2018-08-01 DIAGNOSIS — I351 Nonrheumatic aortic (valve) insufficiency: Secondary | ICD-10-CM

## 2018-08-01 LAB — COMPREHENSIVE METABOLIC PANEL
ALT: 7 U/L (ref 0–44)
ANION GAP: 7 (ref 5–15)
AST: 10 U/L — ABNORMAL LOW (ref 15–41)
Albumin: 3.1 g/dL — ABNORMAL LOW (ref 3.5–5.0)
Alkaline Phosphatase: 35 U/L — ABNORMAL LOW (ref 38–126)
BUN: 22 mg/dL (ref 8–23)
CO2: 27 mmol/L (ref 22–32)
Calcium: 8.8 mg/dL — ABNORMAL LOW (ref 8.9–10.3)
Chloride: 105 mmol/L (ref 98–111)
Creatinine, Ser: 1.05 mg/dL (ref 0.61–1.24)
Glucose, Bld: 252 mg/dL — ABNORMAL HIGH (ref 70–99)
Potassium: 4.3 mmol/L (ref 3.5–5.1)
Sodium: 139 mmol/L (ref 135–145)
Total Bilirubin: 1.2 mg/dL (ref 0.3–1.2)
Total Protein: 5.7 g/dL — ABNORMAL LOW (ref 6.5–8.1)

## 2018-08-01 LAB — CBC WITH DIFFERENTIAL/PLATELET
Abs Immature Granulocytes: 0.04 10*3/uL (ref 0.00–0.07)
Basophils Absolute: 0 10*3/uL (ref 0.0–0.1)
Basophils Relative: 0 %
EOS PCT: 0 %
Eosinophils Absolute: 0 10*3/uL (ref 0.0–0.5)
HCT: 35.8 % — ABNORMAL LOW (ref 39.0–52.0)
Hemoglobin: 10.5 g/dL — ABNORMAL LOW (ref 13.0–17.0)
Immature Granulocytes: 1 %
LYMPHS ABS: 0.4 10*3/uL — AB (ref 0.7–4.0)
Lymphocytes Relative: 4 %
MCH: 25.3 pg — AB (ref 26.0–34.0)
MCHC: 29.3 g/dL — AB (ref 30.0–36.0)
MCV: 86.3 fL (ref 80.0–100.0)
Monocytes Absolute: 0.6 10*3/uL (ref 0.1–1.0)
Monocytes Relative: 7 %
Neutro Abs: 7.6 10*3/uL (ref 1.7–7.7)
Neutrophils Relative %: 88 %
Platelets: 208 10*3/uL (ref 150–400)
RBC: 4.15 MIL/uL — ABNORMAL LOW (ref 4.22–5.81)
RDW: 17.1 % — ABNORMAL HIGH (ref 11.5–15.5)
WBC: 8.7 10*3/uL (ref 4.0–10.5)
nRBC: 0 % (ref 0.0–0.2)

## 2018-08-01 LAB — ECHOCARDIOGRAM COMPLETE
Height: 73 in
WEIGHTICAEL: 2736 [oz_av]

## 2018-08-01 LAB — GLUCOSE, CAPILLARY
GLUCOSE-CAPILLARY: 216 mg/dL — AB (ref 70–99)
GLUCOSE-CAPILLARY: 215 mg/dL — AB (ref 70–99)
Glucose-Capillary: 250 mg/dL — ABNORMAL HIGH (ref 70–99)
Glucose-Capillary: 254 mg/dL — ABNORMAL HIGH (ref 70–99)

## 2018-08-01 LAB — MAGNESIUM: MAGNESIUM: 2.1 mg/dL (ref 1.7–2.4)

## 2018-08-01 MED ORDER — MOMETASONE FURO-FORMOTEROL FUM 200-5 MCG/ACT IN AERO
2.0000 | INHALATION_SPRAY | Freq: Two times a day (BID) | RESPIRATORY_TRACT | Status: DC
  Administered 2018-08-01 – 2018-08-02 (×2): 2 via RESPIRATORY_TRACT
  Filled 2018-08-01: qty 8.8

## 2018-08-01 MED ORDER — INSULIN GLARGINE 100 UNIT/ML ~~LOC~~ SOLN
5.0000 [IU] | Freq: Every day | SUBCUTANEOUS | Status: DC
  Administered 2018-08-01: 5 [IU] via SUBCUTANEOUS
  Filled 2018-08-01 (×2): qty 0.05

## 2018-08-01 MED ORDER — PREDNISONE 20 MG PO TABS
40.0000 mg | ORAL_TABLET | Freq: Every day | ORAL | Status: DC
  Administered 2018-08-02: 40 mg via ORAL
  Filled 2018-08-01: qty 2

## 2018-08-01 MED ORDER — ALBUTEROL SULFATE (2.5 MG/3ML) 0.083% IN NEBU: 2.5000 mg | INHALATION_SOLUTION | RESPIRATORY_TRACT | Status: DC | PRN

## 2018-08-01 MED ORDER — SODIUM CHLORIDE 0.9 % IV SOLN
1.0000 g | INTRAVENOUS | Status: DC
  Administered 2018-08-01: 1 g via INTRAVENOUS
  Filled 2018-08-01 (×2): qty 10

## 2018-08-01 MED ORDER — AZITHROMYCIN 250 MG PO TABS
250.0000 mg | ORAL_TABLET | Freq: Every day | ORAL | Status: DC
  Administered 2018-08-01 – 2018-08-02 (×2): 250 mg via ORAL
  Filled 2018-08-01 (×2): qty 1

## 2018-08-01 NOTE — Progress Notes (Signed)
  Echocardiogram 2D Echocardiogram has been performed.  Johny Chess 08/01/2018, 4:14 PM

## 2018-08-01 NOTE — Progress Notes (Signed)
Inpatient Diabetes Program Recommendations  AACE/ADA: New Consensus Statement on Inpatient Glycemic Control (2015)  Target Ranges:  Prepandial:   less than 140 mg/dL      Peak postprandial:   less than 180 mg/dL (1-2 hours)      Critically ill patients:  140 - 180 mg/dL   Lab Results  Component Value Date   GLUCAP 216 (H) 08/01/2018   HGBA1C 6.9 (H) 04/06/2018    Review of Glycemic Control Results for [name], [name] (MRN [id number]) as of 08/01/2018 11:01  Ref. Range 07/31/2018 11:46 07/31/2018 16:52 07/31/2018 21:02 08/01/2018 07:22  Glucose-Capillary Latest Ref Range: 70 - 99 mg/dL 290 (H) 273 (H) 279 (H) 216 (H)   Diabetes history: DM 2 Outpatient Diabetes medications: Metformin 1000 mg bid Current orders for Inpatient glycemic control:  Novolog sensitive tid with meals and HS Prednisone 50 mg daily  Inpatient Diabetes Program Recommendations:    While on steroids, consider adding Lantus 12 units daily.  Once steroids are tapered, should not need insulin.   Thanks  Adah Perl, RN, BC-ADM Inpatient Diabetes Coordinator Pager [phone] (8a-5p)

## 2018-08-01 NOTE — Progress Notes (Signed)
PROGRESS NOTE  [name] FGH:[id number] DOB: 09-10-1945 DOA: 07/30/2018 PCP: [name], NP  Brief History   73 year old man PMH nonischemic cardiomyopathy, atrial fibrillation, oxygen dependent COPD, diabetes presented with chest pain.  Chest x-ray showed bilateral pulmonary edema and pneumonia.  Admitted for treatment of COPD exacerbation, pneumonia, pulmonary edema, chest pain.  A & P  Lobar pneumonia, right lower lobe --Appears to be improving.  Continue ceftriaxone, resume azithromycin  COPD exacerbation --Appears to be improving, wean steroids.  Continue bronchodilators.  Chest pain, pseudoaneurysm --Echocardiogram showed that aortic root aneurysm was unchanged from January 2018. --Troponins negative, EKG nonacute.  Presumably secondary to pneumonia and COPD exacerbation.  No further evaluation suggested.  Acute on chronic hypoxic respiratory failure on 2 L --Appears to be stabilizing.  Wean to 2 L.  Nonischemic cardiomyopathy, chronic combined systolic, diastolic CHF --Appears to be improving.  Continue diuresis.  Severe mitral regurg regurgitation secondary to bacterial endocarditis, status post prosthetic valve replacement --Per echocardiogram "Bioprosthetic mitral valve mean gradient has increased from 7 mmHg in 08/2016 to 10 mmHg. Based on the DVI and PHT, findings are most consistent with patient prosthesis mismatch" --Follow-up as an outpatient  Afib, status post pacemaker, not a candidate for anticoagulation secondary to history of intracranial hemorrhage per chart --Appears stable.  Continue carvedilol.  Diabetes mellitus type 2 with steroid-induced hyperglycemia.  Metformin on hold. --Appears stable.  Continue sliding scale insulin.     DVT prophylaxis: enoxaparin Code Status: DNR Family Communication: cousin at bedside Disposition Plan: home    Murray Hodgkins, MD  [facility] Direct contact: see [email or web address]  7PM-7AM contact night  coverage as above 08/01/2018, 8:23 PM  LOS: 2 days   Consultants  .   Procedures  . Echo Study Conclusions  - Left ventricle: The cavity size was normal. There was mild   concentric hypertrophy. Systolic function was moderately reduced.   The estimated ejection fraction was in the range of 35% to 40%.   Diffuse hypokinesis. Akinesis of the apical myocardium. - Aortic valve: Transvalvular velocity was within the normal range.   There was no stenosis. There was mild regurgitation. - Aorta: There was dilation of the sinus(es) of Valsalva. Aortic   root dimension: 47 mm (ED). Ascending aortic diameter: 42.1 mm   (S). - Mitral valve: A 48mm Medtronic Mosaic bioprosthesis was present.   Transvalvular velocity was within the normal range. There was no   evidence for stenosis. There was trivial regurgitation. Pressure   half-time: 170 ms. Mean gradient (D): 10 mm Hg. Valve area by   pressure half-time: 1.29 cm^2. Valve area by continuity equation   (using LVOT flow): 1.33 cm^2. - Left atrium: The atrium was severely dilated. - Right ventricle: The cavity size was normal. Wall thickness was   normal. Systolic function was moderately reduced. - Right atrium: The atrium was severely dilated. - Tricuspid valve: There was trivial regurgitation. - Pulmonary arteries: Systolic pressure was mildly to moderately   increased. PA peak pressure: 49 mm Hg (S).  Impressions:  - Bioprosthetic mitral valve mean gradient has increased from 7   mmHg in 08/2016 to 10 mmHg. Based on the DVI and PHT, findings are   most consistent with patient prosthesis mismatch. There is an   aortic root aneurysm that is unchanged from 07/2016.  Antibiotics  . Azithromycin 1/20 . Ceftriaxone 1/20 >  Interval History/Subjective  Afebrile, vital signs stable.  Continues to have central chest pain, much improved over admission, now 5/10.  No nausea or vomiting.  Breathing okay.  Objective   Vitals:  Vitals:    08/01/18 0723 08/01/18 1643  BP: 114/68 108/81  Pulse: 73 76  Resp:    Temp: 97.9 F (36.6 C)   SpO2: 93% 96%    Exam:  Constitutional:  . Appears calm and comfortable Eyes:  . pupils and irises appear normal ENMT:  . Hard of hearing . Tongue appears unremarkable Respiratory:  . CTA bilaterally, no w/r/r.  . Respiratory effort normal.  Cardiovascular:  . RRR, no m/r/g . Paced rhythm on telemetry . No LE extremity edema   Abdomen:  . Soft, nontender Musculoskeletal:  . No lower extremity edema Psychiatric:  . Mental status o Mood, affect appropriate   I have personally reviewed the following:   Today's Data  . CBG 200s. . CMP unremarkable otherwise . Troponins negative . Lactic acid within normal limits, procalcitonin negative . Hemoglobin 10.5, WBC 8.7, platelets within normal limits.  Lab Data  . Complete metabolic panel unremarkable  Micro Data  .   Imaging  . Chest x-ray independently reviewed showed bilateral pulmonary infiltrates, especially right lower lung field, also seen fluid in lower fissure on the right.  Pulmonary edema seen.  Cardiology Data  . EKG showed sinus rhythm with right bundle branch block, old, inferior infarct, old  Other Data  .   Scheduled Meds: . aspirin EC  81 mg Oral Daily  . atorvastatin  10 mg Oral q1800  . azithromycin  250 mg Oral Daily  . carvedilol  3.125 mg Oral BID WC  . enoxaparin (LOVENOX) injection  40 mg Subcutaneous Q24H  . furosemide  40 mg Oral Daily  . insulin aspart  0-9 Units Subcutaneous TID WC  . mometasone-formoterol  2 puff Inhalation BID  . olopatadine  1 drop Both Eyes Daily  . pantoprazole  40 mg Oral BID AC  . [START ON 08/02/2018] predniSONE  40 mg Oral Q breakfast  . sodium chloride flush  3 mL Intravenous Once  . sucralfate  1 g Oral TID WC & HS   Continuous Infusions: . cefTRIAXone (ROCEPHIN)  IV      Principal Problem:   Community acquired pneumonia Active Problems:   Primary  cancer of left upper lobe of lung (HCC)   COPD GOLD II   GERD   Hypertension   Permanent atrial fibrillation   Ejection fraction < 50%   ICD (implantable cardioverter-defibrillator), biventricular, in situ   Chronic respiratory failure assoc with chf/ PAH   Cardiomyopathy, nonischemic (HCC)   Diabetes ([place])   Chronic combined systolic and diastolic CHF (congestive heart failure) ([place])   Pulmonary edema   COPD with acute exacerbation ([place])   LOS: 2 days

## 2018-08-02 DIAGNOSIS — R079 Chest pain, unspecified: Secondary | ICD-10-CM

## 2018-08-02 DIAGNOSIS — I4821 Permanent atrial fibrillation: Secondary | ICD-10-CM

## 2018-08-02 DIAGNOSIS — J9611 Chronic respiratory failure with hypoxia: Secondary | ICD-10-CM

## 2018-08-02 LAB — GLUCOSE, CAPILLARY
Glucose-Capillary: 134 mg/dL — ABNORMAL HIGH (ref 70–99)
Glucose-Capillary: 320 mg/dL — ABNORMAL HIGH (ref 70–99)

## 2018-08-02 MED ORDER — AZITHROMYCIN 250 MG PO TABS: 250.0000 mg | ORAL_TABLET | Freq: Every day | ORAL | 0 refills | Status: AC

## 2018-08-02 MED ORDER — CEFUROXIME AXETIL 500 MG PO TABS: 500.0000 mg | ORAL_TABLET | Freq: Two times a day (BID) | ORAL | 0 refills | Status: AC

## 2018-08-02 MED ORDER — PREDNISONE 10 MG PO TABS: ORAL_TABLET | ORAL | 0 refills | Status: AC

## 2018-08-02 NOTE — Discharge Summary (Signed)
Physician Discharge Summary  [name] CWC:[id number] DOB: Oct 12, 1945 DOA: 07/30/2018  PCP: [name], NP  Admit date: 07/30/2018 Discharge date: 08/02/2018  Recommendations for Outpatient Follow-up:   Severe mitral regurg regurgitation secondary to bacterial endocarditis, status post prosthetic valve replacement --Per echocardiogram "Bioprosthetic mitral valve mean gradient has increased from 54mmHg in 08/2016 to 10 mmHg. Based on the DVI and PHT, findings aremost consistent with patient prosthesis mismatch" --Follow-up as an outpatient   Follow-up Information    [name], NP. Schedule an appointment as soon as possible for a visit in 2 week(s).   Specialty:  Family Medicine Contact information: [address] [place] [address] [phone]            Discharge Diagnoses: Principal diagnosis is #1 1. Lobar pneumonia, right lower lobe 2. COPD exacerbation 3. Chest pain, pseudoaneurysm 4. Acute on chronic hypoxic respiratory failure on 2 L 5. Nonischemic cardiomyopathy, chronic combined systolic, diastolic CHF 6. Severe mitral regurg regurgitation secondary to bacterial endocarditis, status post prosthetic valve replacement 7. Afib, status post pacemaker 8. Diabetes mellitus type 2 with steroid-induced hyperglycemia.   Discharge Condition: improved Disposition: home  Diet recommendation: heart healthy, diabetic diet  Filed Weights   07/30/18 1703  Weight: 77.6 kg    History of present illness:  73 year old man PMH nonischemic cardiomyopathy, atrial fibrillation, oxygen dependent COPD, diabetes presented with chest pain.  Chest x-ray showed bilateral pulmonary edema and pneumonia.  Admitted for treatment of COPD exacerbation, pneumonia, pulmonary edema, chest pain.  Hospital Course:  Patient was treated with IV antibiotics with rapid clinical improvement.  COPD responded to standard treatment.  Chest pain appears to be chronic and work-up  was negative.  Known aortic pseudoaneurysm was stable by echocardiogram and is already followed in the outpatient setting by Dr. Roxy Manns.  Hospitalization was uncomplicated, see individual issues below.  Lobar pneumonia, right lower lobe --Appears resolved.    Complete antibiotics as an outpatient.  COPD exacerbation --Continues to improve, wean steroids.  Continue bronchodilators.  Chest pain, pseudoaneurysm --Echocardiogram showed that aortic root aneurysm was unchanged from January 2018. --Troponins negative, EKG nonacute.  Presumably secondary to pneumonia and COPD exacerbation.  No further evaluation suggested. --Patient reports chronic chest pain.  No evidence of ACS.  Acute on chronic hypoxic respiratory failure on 2 L --Appears to be at baseline 2 L now  Nonischemic cardiomyopathy, chronic combined systolic, diastolic CHF --Appears to be euvolemic.  Continue oral diuretic.  Severe mitral regurg regurgitation secondary to bacterial endocarditis, status post prosthetic valve replacement --Per echocardiogram "Bioprosthetic mitral valve mean gradient has increased from 42mmHg in 08/2016 to 10 mmHg. Based on the DVI and PHT, findings aremost consistent with patient prosthesis mismatch" --Follow-up as an outpatient  Afib, status post pacemaker, not a candidate for anticoagulation secondary to history of intracranial hemorrhage per chart --Appears stable.  Continue carvedilol.  Diabetes mellitus type 2 with steroid-induced hyperglycemia.  Metformin on hold. --Appears stable.  Resume metformin on discharge.  Consultants     Procedures   Echo Study Conclusions  - Left ventricle: The cavity size was normal. There was mild concentric hypertrophy. Systolic function was moderately reduced. The estimated ejection fraction was in the range of 35% to 40%. Diffuse hypokinesis. Akinesis of the apical myocardium. - Aortic valve: Transvalvular velocity was within the normal  range. There was no stenosis. There was mild regurgitation. - Aorta: There was dilation of the sinus(es) of Valsalva. Aortic root dimension: 47 mm (ED). Ascending aortic diameter: 42.1  mm (S). - Mitral valve: A 17mm Medtronic Mosaic bioprosthesis was present. Transvalvular velocity was within the normal range. There was no evidence for stenosis. There was trivial regurgitation. Pressure half-time: 170 ms. Mean gradient (D): 10 mm Hg. Valve area by pressure half-time: 1.29 cm^2. Valve area by continuity equation (using LVOT flow): 1.33 cm^2. - Left atrium: The atrium was severely dilated. - Right ventricle: The cavity size was normal. Wall thickness was normal. Systolic function was moderately reduced. - Right atrium: The atrium was severely dilated. - Tricuspid valve: There was trivial regurgitation. - Pulmonary arteries: Systolic pressure was mildly to moderately increased. PA peak pressure: 49 mm Hg (S).  Impressions:  - Bioprosthetic mitral valve mean gradient has increased from 7 mmHg in 08/2016 to 10 mmHg. Based on the DVI and PHT, findings are most consistent with patient prosthesis mismatch. There is an aortic root aneurysm that is unchanged from 07/2016.  Antibiotics   Azithromycin 1/20 > 1/24  Ceftriaxone 1/20 > 1/22  Ceftin 1/23 > 1/24  Today's assessment: S: Overall feels better.  Breathing fine.  Tolerating diet.  Has some soreness where he had the echocardiogram yesterday and the chest wall.  He reports chronic chest pain for several weeks now without change. O: Vitals:  Vitals:   08/02/18 0735 08/02/18 0740  BP:  110/73  Pulse:  78  Resp:    Temp:  97.7 F (36.5 C)  SpO2: 96% 95%    Constitutional:  . Appears calm and comfortable Respiratory:  . CTA bilaterally, no w/r/r.  . Respiratory effort normal. N Cardiovascular:  . RRR, no m/r/g . No LE extremity edema   Psychiatric:  . Mental status o Mood, affect  appropriate  CBG stable  Discharge Instructions  Discharge Instructions    Diet - low sodium heart healthy   Complete by:  As directed    Diet Carb Modified   Complete by:  As directed    Discharge instructions   Complete by:  As directed    Call your physician or seek immediate medical attention for fever, shortness of breath, worsening pain or worsening of condition.   Increase activity slowly   Complete by:  As directed      Allergies as of 08/02/2018      Reactions   Anticoagulant Compound Other (See Comments)   Pt had intracranial bleed, therefore all anticoagulation is contraindicated per Dr. Ron Parker   Other Other (See Comments)   Per Dr. Halford Chessman (Surgeon): stated that the patient cannot be put under for any surgery, as he has an enlarged aorta. He would stand only a 50/50 chance of surviving. He has lung issues, diminished lung tissue, COPD, and emphysema.   Warfarin Sodium Other (See Comments)   Pt had intracranial bleed, therefore all anticoagulation is contraindicated per Dr. Ron Parker      Medication List    STOP taking these medications   doxycycline 100 MG capsule Commonly known as:  VIBRAMYCIN     TAKE these medications   acetaminophen 500 MG tablet Commonly known as:  TYLENOL Take 500-1,000 mg by mouth every 6 (six) hours as needed for moderate pain or headache.   amoxicillin 500 MG capsule Commonly known as:  AMOXIL Take 2,000 mg by mouth See admin instructions. Take 2,000 mg by mouth one hour prior to dental procedures   aspirin EC 81 MG tablet Take 1 tablet (81 mg total) by mouth daily.   atorvastatin 10 MG tablet Commonly known as:  LIPITOR TAKE  1 TABLET BY MOUTH EVERY DAY What changed:  when to take this   azithromycin 250 MG tablet Commonly known as:  ZITHROMAX Take 1 tablet (250 mg total) by mouth daily for 1 dose. Take 1/24 in AM Start taking on:  August 03, 2018   budesonide-formoterol 160-4.5 MCG/ACT inhaler Commonly known as:   SYMBICORT INHALE 2 PUFFS INTO THE LUNGS TWICE A DAY What changed:    how much to take  how to take this  when to take this  additional instructions   carvedilol 3.125 MG tablet Commonly known as:  COREG TAKE 1 TABLET BY MOUTH TWICE A DAY WITH MEALS   cefUROXime 500 MG tablet Commonly known as:  CEFTIN Take 1 tablet (500 mg total) by mouth 2 (two) times daily for 3 doses. Start 1/23 in the evening.   cetirizine 10 MG tablet Commonly known as:  ZYRTEC Take 10 mg by mouth daily.   [facility] TUSSIN DM COUGH/CHEST PO Take 5-10 mLs by mouth every 6 (six) hours as needed (for congestion or to loosen mucous).   furosemide 40 MG tablet Commonly known as:  LASIX TAKE 1 TABLET BY MOUTH EVERY DAY   metFORMIN 1000 MG tablet Commonly known as:  GLUCOPHAGE TAKE 1 TABLET BY MOUTH TWICE A DAY WITH MEALS   nitroGLYCERIN 0.4 MG SL tablet Commonly known as:  NITROSTAT Place 0.4 mg under the tongue every 5 (five) minutes x 3 doses as needed for chest pain.   Olopatadine HCl 0.7 % Soln Commonly known as:  PAZEO Apply 1 drop to eye daily. What changed:  how to take this   OXYGEN Inhale 2 L into the lungs continuous.   predniSONE 10 MG tablet Commonly known as:  DELTASONE Take 4 tablets (40 mg total) by mouth daily with breakfast for 3 days, THEN 2 tablets (20 mg total) daily with breakfast for 3 days, THEN 1 tablet (10 mg total) daily with breakfast for 3 days. Start taking on:  August 03, 2018 What changed:  See the new instructions.   PROVENTIL HFA 108 (90 Base) MCG/ACT inhaler Generic drug:  albuterol Inhale 1-2 puffs into the lungs every 6 (six) hours as needed for wheezing or shortness of breath.   valsartan 40 MG tablet Commonly known as:  DIOVAN TAKE 1 TABLET BY MOUTH EVERY DAY      Allergies  Allergen Reactions  . Anticoagulant Compound Other (See Comments)    Pt had intracranial bleed, therefore all anticoagulation is contraindicated per Dr. Ron Parker  . Other Other (See  Comments)    Per Dr. Halford Chessman (Surgeon): stated that the patient cannot be put under for any surgery, as he has an enlarged aorta. He would stand only a 50/50 chance of surviving. He has lung issues, diminished lung tissue, COPD, and emphysema.  . Warfarin Sodium Other (See Comments)    Pt had intracranial bleed, therefore all anticoagulation is contraindicated per Dr. Ron Parker    The results of significant diagnostics from this hospitalization (including imaging, microbiology, ancillary and laboratory) are listed below for reference.    Significant Diagnostic Studies: Dg Chest 2 View  Result Date: 07/30/2018 CLINICAL DATA:  Chest pain EXAM: CHEST - 2 VIEW COMPARISON:  07/26/2018 FINDINGS: Stable cardiomegaly with chronic appearing vascular congestion. Worsening asymmetric mixed interstitial and airspace disease, more consolidated in the bases, difficult to exclude multilobar pneumonia. Asymmetric alveolar edema could have a similar appearance. Trace pleural effusions suspected. Background COPD/emphysema noted. No pneumothorax. Right subclavian AICD present. IMPRESSION: Cardiomegaly  with chronic COPD and worsening mixed interstitial/alveolar opacities. Suspect a combination of bilateral edema and pneumonia particularly in the lower lobes. Trace pleural effusions Electronically Signed   By: Jerilynn Mages.  Shick M.D.   On: 07/30/2018 12:33   Dg Chest 2 View  Result Date: 07/26/2018 CLINICAL DATA:  Cough. EXAM: CHEST - 2 VIEW COMPARISON:  CT 12/22/2017.  Chest x-ray 09/25/2015. FINDINGS: Mediastinum and hilar structures are stable. AICD in stable position. Stable cardiomegaly. COPD. Diffuse bilateral pulmonary interstitial prominence. Although a component these changes may be chronic active interstitial process including pneumonitis/interstitial edema can not be excluded. Tiny left pleural effusion. Small bilateral pleural effusions. Stable elevation left hemidiaphragm. Mild scoliosis thoracic spine. Osteopenia  and degenerative changes thoracic spine. Lower thoracic upper lumbar spine compression fracture, age undetermined. IMPRESSION: 1.  AICD in stable position.  Stable cardiomegaly. 2. COPD. Diffuse bilateral pulmonary interstitial prominence. Although a component these changes may be chronic, an active interstitial process including pneumonitis/interstitial edema can not be excluded. Tiny bilateral pleural effusions. 3. Diffuse thoracic spine osteopenia degenerative change. Lower thoracic/upper lumbar spine compression fracture, age undetermined. Electronically Signed   By: Marcello Moores  Register   On: 07/26/2018 08:59    Microbiology: Recent Results (from the past 240 hour(s))  Culture, blood (routine x 2)     Status: None (Preliminary result)   Collection Time: 07/30/18  1:30 PM  Result Value Ref Range Status   Specimen Description BLOOD RIGHT ANTECUBITAL  Final   Special Requests   Final    BOTTLES DRAWN AEROBIC AND ANAEROBIC Blood Culture results may not be optimal due to an excessive volume of blood received in culture bottles   Culture   Final    NO GROWTH 3 DAYS Performed at [facility], [place] [address], [place], [place] [address]    Report Status PENDING  Incomplete  Culture, blood (routine x 2)     Status: None (Preliminary result)   Collection Time: 07/30/18  1:30 PM  Result Value Ref Range Status   Specimen Description BLOOD BLOOD RIGHT HAND  Final   Special Requests   Final    BOTTLES DRAWN AEROBIC AND ANAEROBIC Blood Culture adequate volume   Culture   Final    NO GROWTH 3 DAYS Performed at [facility], [place] [address], [place], [place] [address]    Report Status PENDING  Incomplete     Labs: Basic Metabolic Panel: Recent Labs  Lab 07/30/18 1115 07/31/18 0923 08/01/18 0531  NA 139 139 139  K 4.4 4.5 4.3  CL 102 102 105  CO2 25 26 27   GLUCOSE 170* 291* 252*  BUN 20 19 22   CREATININE 1.20 1.12 1.05  CALCIUM 8.8* 9.1 8.8*  MG  --  1.8 2.1   Liver Function  Tests: Recent Labs  Lab 07/31/18 0923 08/01/18 0531  AST 12* 10*  ALT 8 7  ALKPHOS 39 35*  BILITOT 1.8* 1.2  PROT 6.6 5.7*  ALBUMIN 3.5 3.1*   CBC: Recent Labs  Lab 07/30/18 1115 07/31/18 0923 08/01/18 0531  WBC 8.2 5.2 8.7  NEUTROABS  --  4.8 7.6  HGB 12.1* 12.0* 10.5*  HCT 40.6 39.5 35.8*  MCV 87.9 84.9 86.3  PLT 213 217 208   Cardiac Enzymes: Recent Labs  Lab 07/31/18 0923 07/31/18 1556 07/31/18 2128  TROPONINI <0.03 <0.03 <0.03    Recent Labs    07/30/18 1330  BNP 437.2*    CBG: Recent Labs  Lab 08/01/18 1147 08/01/18 1642  08/01/18 2105 08/02/18 0739 08/02/18 1145  GLUCAP 250* 254* 215* 134* 320*    Principal Problem:   Community acquired pneumonia Active Problems:   Primary cancer of left upper lobe of lung (HCC)   COPD GOLD II   GERD   Hypertension   Permanent atrial fibrillation   Ejection fraction < 50%   ICD (implantable cardioverter-defibrillator), biventricular, in situ   Chronic respiratory failure assoc with chf/ PAH   Cardiomyopathy, nonischemic (HCC)   Diabetes ([place])   Chronic combined systolic and diastolic CHF (congestive heart failure) (HCC)   Pulmonary edema   COPD with acute exacerbation ([place])   Time coordinating discharge: 40 minutes  Signed:  Murray Hodgkins, MD  [facility]  08/02/2018, 12:01 PM

## 2018-08-02 NOTE — Care Management Important Message (Signed)
Important Message  Patient Details  Name: [name] MRN: [id number] Date of Birth: [birth year]   Medicare Important Message Given:  Yes    Virgin Zellers 08/02/2018, 2:10 PM

## 2018-08-02 NOTE — Consult Note (Signed)
            [facility] CM Primary Care Navigator  08/02/2018  [name] 1946-03-12 [id number]   Seen patient at the bedside to identify possible discharge needs.   Patient reports having "chest pain", with chest x-ray showing bilateral pulmonary edema and pneumonia which had led to this admission. (lobar pneumonia- right lower lobe, COPD exacerbation, pulmonary edema, acute on chronic hypoxic respiratory failure on 2 L O2, DM II with steroid-induced hyperglycemia)  Patient endorses Diplomatic Services operational officer with [employer] [facility] [place] as his primary care provider.  [facility] [address] [facility].   Patient's wife [name]) has been managing his medications at home using "pill box" filled weekly.   Patient reports that he has been driving prior to admission but wife will be able to transport him to his doctors' appointments if needed after discharge.   Patient lives at home with wife who serves as his primary caregiver when needed.   Anticipated discharge plan is home per patient.  Patient voiced understanding to call primary care provider's office when he gets home for a post discharge follow-up appointment within 1- 2 weeks or sooner if needs arise. Patient letter (with PCP's contact number) was provided as a reminder.   Explained to patient regarding [facility] CM services available for health management and resources at home but states he does not need the services for now and verbalized having no pressing issues that bothers him at this point. Patient declined [facility] CM services offered and states will have close follow-up with primary care provider.  Encouraged patientto seekreferral from primary care provider to [facility] care management if deemednecessaryand appropriate for services in the nearfuture or when patient changes his mind about needing services.  [facility] care management information was provided for future needs  thathemay have.  At this time, patientwould only opt and verbally agree forEMMI Pneumonia calls to follow-up with hisrecovery at home.  Referral made for EMMI Pneumonia calls after discharge.  Primary care provider's office is listed as providing transition of care (TOC).     For additional questions please contact:  Edwena Felty A. Jerome Otter, BSN, RN-BC [facility] PRIMARY CARE Navigator Cell: [phone]

## 2018-08-04 LAB — CULTURE, BLOOD (ROUTINE X 2)
Culture: NO GROWTH
Culture: NO GROWTH
Special Requests: ADEQUATE

## 2018-08-17 ENCOUNTER — Ambulatory Visit (INDEPENDENT_AMBULATORY_CARE_PROVIDER_SITE_OTHER): Payer: Medicare Other | Admitting: Adult Health

## 2018-08-17 ENCOUNTER — Ambulatory Visit (INDEPENDENT_AMBULATORY_CARE_PROVIDER_SITE_OTHER): Payer: Medicare Other

## 2018-08-17 ENCOUNTER — Encounter: Payer: Self-pay | Admitting: Adult Health

## 2018-08-17 VITALS — BP 100/74 | HR 74 | Temp 97.8°F | Wt 164.0 lb

## 2018-08-17 DIAGNOSIS — J181 Lobar pneumonia, unspecified organism: Secondary | ICD-10-CM

## 2018-08-17 DIAGNOSIS — J189 Pneumonia, unspecified organism: Secondary | ICD-10-CM

## 2018-08-17 NOTE — Patient Instructions (Signed)
It was great seeing you today!   I am very happy you are feeling better.   We will get another xray today to make sure the infection has cleared

## 2018-08-17 NOTE — Progress Notes (Signed)
Subjective:    Patient ID: [name], male    DOB: 12/09/1945, 73 y.o.   MRN: [id number]  HPI 73 year old male who  has a past medical history of Atrial fibrillation ([place]), Atrial septal defect, Automatic implantable cardioverter-defibrillator in situ, Cardiomyopathy, CHF (congestive heart failure) ([place]), Chronic combined systolic and diastolic CHF (congestive heart failure) ([place]), Colon polyps, COPD (chronic obstructive pulmonary disease) ([place]), COPD GOLD II (01/11/2007), CVA (cerebral vascular accident) ([place]) (2009), Diabetes mellitus without complication ([place]), Dyslipidemia, Dysrhythmia, Ejection fraction < 50%, Endocarditis, Headache(784.0), HLD (hyperlipidemia), Hypertension, Intracranial hemorrhage ([place]), Lung cancer ([place]) (11/29/2013), Mitral regurgitation, Myocardial infarction ([place]) (2010), Pacemaker, Permanent atrial fibrillation, Pneumonia (07/2018), Prosthetic valve dysfunction, Pulmonary hypertension ([place]), Renal artery stenosis (HCC), 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  He presents to the office today for TCM visit   Admit Date: 07/30/2018 Discharge Date: 08/02/2018  He originally presented to the emergency room with chest pain.  Chest x-ray showed bilateral pulmonary edema and pneumonia.  He was admitted for treatment of COPD exacerbation, pneumonia, pulmonary edema, and chest pain.  Hospital course She was treated with IV antibiotics with rapid clinical improvement.  COPD responded to standard treatment.  Chest pain appeared to be chronic and work-up was negative.  He has a known aortic pseudoaneurysm which was stable by echocardiogram and is followed as outpatient by Dr. Roxy Manns.  Right Lower Lobe Pneumonia  -Was treated with IV antibiotics and discharged with azithromycin 1 dose and Ceftin twice daily x3 days.  COPD  Exacerbation  -Continue to improve throughout his hospital admission.  Was sent home on a prednisone taper  Chest Pain, pseudoaneurysm  -Echocardiogram showed that aortic root aneurysm was unchanged from January 2018. -Troponins were negative, EKG was nonacute.  Presumably secondary to pneumonia and COPD exacerbation.  There was no evidence of ACS  Acute on chronic hypoxic respiratory failure on 2 L -On baseline 2 L upon discharge  Ischemic cardiomyopathy, chronic combined systolic diastolic CHF -Was euvolemic.  Was continued on oral diuretic  Severe mitral regurg secondary to bacterial endocarditis, status post prosthetic valve replacement -Echocardiogram "bio prosthetic mitral valve mean gradient has increased from 7 mmHg into 2018 to 10 mmHg.  Based on the DVI of the PHT, findings are most consistent with patient prosthesis mismatch" -follow-up as outpatient   Today in the office he reports " I am feeling fine!". He denies fevers, chills, feeling acutely ill, productive cough, SOB past baseline, or CP. He finished his outpatient abx course.     Review of Systems  Constitutional: Negative.   HENT: Negative.   Respiratory: Positive for shortness of breath (chronic).   Cardiovascular: Negative.   Genitourinary: Negative.   Neurological: Negative.   Hematological: Negative.   Psychiatric/Behavioral: Negative.   All other systems reviewed and are negative.  Past Medical History:  Diagnosis Date  . Atrial fibrillation ([place])    AV Node ablation January, 2010, for rapid atrial fib  . Atrial septal defect    Closed with surgery January, 2010  . Automatic implantable cardioverter-defibrillator in situ    LV dysfunction and pacer needed for AV node lesion  . Cardiomyopathy    non-ischemic  . CHF (congestive heart failure) ([place])   . Chronic combined systolic and diastolic CHF (congestive heart failure) ([place])   . Colon polyps   . COPD (chronic obstructive pulmonary disease) (HCC)     O2-  2 liters, nasal cannula, q night   . COPD GOLD II 01/11/2007   PFT's 09/25/13  FEV1  2.18 (61%) ratio 64 no change p B2 and DLC0  32% corrects to 68% - trial off advair and acei rec starting  08/23/2013     . CVA (cerebral vascular accident) ([place]) 2009   denies residual on 08/14/2013  . Diabetes mellitus without complication ([place])    type 2  . Dyslipidemia   . Dysrhythmia   . Ejection fraction < 50%   . Endocarditis    Bacterial, 2009  . Headache(784.0)    related to stroke only  . HLD (hyperlipidemia)   . Hypertension   . Intracranial hemorrhage (HCC)    Coumadin cannot be used because of the history of his bleed  . Lung cancer ([place]) 11/29/2013   T1N0 Stage Ia non-small cell carcinoma left lung treated with wedge resection  . Mitral regurgitation    Severe symptomatic primary MR due to bacterial endocarditis, treated w/ MVR // Echo 1/18 EF 40-45, diffuse HK, dilated aorta at 42 mm/aortic root 47 mm; linear echogenic structure in ascending aorta - suspect reverberation artifact / consider CT or TEE to rule out dissection flap, bioprosthetic MVR with mean gradient 3, severe LAE, low normal RVSF, severe RAE, moderate TR, PASP 42  . Myocardial infarction ([place]) 2010  . Pacemaker    combo pacer and icd  . Permanent atrial fibrillation    Originally Coumadin use for atrial fibrillation  //   he had intracerebral hemorrhage with an INR of 2.3 June, 2009. Anticoagulation could no longer be used.  //  Rapid atrial fibrillation after inferior MI October, 2010..........Marland Kitchen AV node ablation done at that time with ICD pacemaker placed (EF 35%).   //   Left atrial appendage tied off at the time of mitral valve surgery January, 2010 (maze pro  . Pneumonia 07/2018  . Prosthetic valve dysfunction    Mild mitral stenosis  . Pulmonary hypertension (HCC)    Moderate  . Renal artery stenosis (HCC)    Mild by history  . 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 Spontaneous pneumothorax    right thoracotomy  - distant past  . Status post minimally invasive mitral valve replacement with bioprosthetic valve    33 mm Medtronic Mosaic porcine bioprosthesis placed via right mini thoracotomy for bacterial endocarditis complicated by severe MR and CHF   . Thoracic aortic aneurysm ([place]) 08/11/2016   a - Chest CTA 1/18:  Aneurysmal dilatation of aortic root is noted at 5.1 cm.     Social History   Socioeconomic History  . Marital status: Married    Spouse name: [name]  . Number of children: 0  . Years of education: College  . Highest education level: Not on file  Occupational History  . 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 Financial resource strain: Not on file  . Food insecurity:    Worry: Not on file    Inability: Not on file  . Transportation needs:    Medical: Not on file    Non-medical: Not on file  Tobacco Use  . Smoking status: Former Smoker    Packs/day: 1.00    Years: 45.00    Pack years: 45.00    Types: Cigarettes    Last attempt to quit: 08/11/2013    Years since quitting: 5.0  . Smokeless tobacco: Never Used  . Tobacco comment: 08/14/2013 "  quit smoking in 2009"  Substance and Sexual Activity  . Alcohol use: No    Alcohol/week: 0.0 standard drinks    Comment: 08/14/2013 "used to drink beer; quit:in 1982"  . Drug use: No  . Sexual activity: Yes  Lifestyle  . Physical activity:    Days per week: Not on file    Minutes per session: Not on file  . Stress: Not on file  Relationships  . Social connections:    Talks on phone: Not on file    Gets together: Not on file    Attends religious service: Not on file    Active member of club or organization: Not on file    Attends meetings of clubs or organizations: Not on file    Relationship status: Not on file  . Intimate partner violence:    Fear of current or ex partner: Not on file    Emotionally abused: Not on file    Physically abused: Not on file    Forced sexual activity: Not  on file  Other Topics Concern  . Not on file  Social History Narrative   Patient lives at home with his spouse.   Caffeine Use: none   Worked for the post office   Has two boys and a girl. All live local.     Past Surgical History:  Procedure Laterality Date  . APPENDECTOMY    . ASD REPAIR, SECUNDUM  07/17/2008   pericardial patch closure of ASD  . AV NODE ABLATION  07/2008   for rapid atrial fib  . CARDIAC CATHETERIZATION    . CARDIAC DEFIBRILLATOR PLACEMENT  ~ [address] [employer]  . CARDIAC VALVE REPLACEMENT    . CATARACT EXTRACTION W/ INTRAOCULAR LENS  IMPLANT, BILATERAL Bilateral   . ESOPHAGOGASTRODUODENOSCOPY (EGD) WITH PROPOFOL N/A 09/29/2016   Procedure: ESOPHAGOGASTRODUODENOSCOPY (EGD) WITH PROPOFOL;  Surgeon: [name], MD;  Location: [facility] ENDOSCOPY;  Service: Endoscopy;  Laterality: N/A;  . HERNIA REPAIR    . IMPLANTABLE CARDIOVERTER DEFIBRILLATOR (ICD) GENERATOR CHANGE N/A 02/06/2012   Procedure: ICD GENERATOR CHANGE;  Surgeon: [name], MD;  Location: [facility] CATH LAB;  Service: Cardiovascular;  Laterality: N/A;  . INSERT / REPLACE / REMOVE PACEMAKER    . MASS BIOPSY Left    neck mass  . MITRAL VALVE REPLACEMENT Right 07/17/2008   56mm Medtronic Mosaic porcine bioprosthesis  . PENILE PROSTHESIS IMPLANT    . RIGHT HEART CATHETERIZATION N/A 08/16/2013   Procedure: RIGHT HEART CATH;  Surgeon: Josue Hector, MD;  Location: [facility] CATH LAB;  Service: Cardiovascular;  Laterality: N/A;  . TEE WITHOUT CARDIOVERSION N/A 09/06/2016   Procedure: TRANSESOPHAGEAL ECHOCARDIOGRAM (TEE);  Surgeon: Dorothy Spark, MD;  Location: [facility] ENDOSCOPY;  Service: Cardiovascular;  Laterality: N/A;  . THORACOTOMY Right 1970's   spontaneous pneumothorax - while in the [place]  . TOE SURGERY     left foot hammer toe  . TONSILLECTOMY    . VIDEO ASSISTED THORACOSCOPY (VATS)/WEDGE RESECTION Left 11/29/2013   Procedure: Video assisted thoracoscopy for wedge resection; mini thoracotomy;  Surgeon: [name], MD;  Location: [facility] OR;  Service: Thoracic;  Laterality: Left;    Family History  Problem Relation Age of Onset  . Stomach cancer Father   . Stroke Mother     Allergies  Allergen Reactions  . Anticoagulant Compound Other (See Comments)    Pt had intracranial bleed, therefore all anticoagulation is contraindicated per Dr. Ron Parker  . Other Other (See Comments)  Per Dr. Halford Chessman (Surgeon): stated that the patient cannot be put under for any surgery, as he has an enlarged aorta. He would stand only a 50/50 chance of surviving. He has lung issues, diminished lung tissue, COPD, and emphysema.  . Warfarin Sodium Other (See Comments)    Pt had intracranial bleed, therefore all anticoagulation is contraindicated per Dr. Ron Parker    Current Outpatient Medications on File Prior to Visit  Medication Sig Dispense Refill  . acetaminophen (TYLENOL) 500 MG tablet Take 500-1,000 mg by mouth every 6 (six) hours as needed for moderate pain or headache.    . albuterol (PROVENTIL HFA) 108 (90 Base) MCG/ACT inhaler Inhale 1-2 puffs into the lungs every 6 (six) hours as needed for wheezing or shortness of breath.    Marland Kitchen amoxicillin (AMOXIL) 500 MG capsule Take 2,000 mg by mouth See admin instructions. Take 2,000 mg by mouth one hour prior to dental procedures    . aspirin EC 81 MG tablet Take 1 tablet (81 mg total) by mouth daily. 90 tablet 3  . atorvastatin (LIPITOR) 10 MG tablet TAKE 1 TABLET BY MOUTH EVERY DAY (Patient taking differently: Take 10 mg by mouth at bedtime. ) 90 tablet 3  . budesonide-formoterol (SYMBICORT) 160-4.5 MCG/ACT inhaler INHALE 2 PUFFS INTO THE LUNGS TWICE A DAY (Patient taking differently: Inhale 2 puffs into the lungs 2 (two) times daily. ) 10.2 Inhaler 6  . carvedilol (COREG) 3.125 MG tablet TAKE 1 TABLET BY MOUTH TWICE A DAY WITH MEALS (Patient taking differently: Take 3.125 mg by mouth 2 (two) times daily with a meal. ) 180 tablet 1  . cetirizine (ZYRTEC) 10 MG tablet Take 10  mg by mouth daily.    Marland Kitchen Dextromethorphan-guaiFENesin ([employer] TUSSIN DM COUGH/CHEST PO) Take 5-10 mLs by mouth every 6 (six) hours as needed (for congestion or to loosen mucous).    . furosemide (LASIX) 40 MG tablet TAKE 1 TABLET BY MOUTH EVERY DAY (Patient taking differently: Take 40 mg by mouth daily. ) 90 tablet 3  . metFORMIN (GLUCOPHAGE) 1000 MG tablet TAKE 1 TABLET BY MOUTH TWICE A DAY WITH MEALS (Patient taking differently: Take 1,000 mg by mouth 2 (two) times daily with a meal. ) 180 tablet 1  . nitroGLYCERIN (NITROSTAT) 0.4 MG SL tablet Place 0.4 mg under the tongue every 5 (five) minutes x 3 doses as needed for chest pain.     Marland Kitchen Olopatadine HCl (PAZEO) 0.7 % SOLN Apply 1 drop to eye daily. (Patient taking differently: Place 1 drop into both eyes daily. ) 1 Bottle 0  . OXYGEN Inhale 2 L into the lungs continuous.     . valsartan (DIOVAN) 40 MG tablet TAKE 1 TABLET BY MOUTH EVERY DAY (Patient taking differently: Take 40 mg by mouth daily. ) 90 tablet 2   No current facility-administered medications on file prior to visit.     BP 100/74   Pulse 74   Temp 97.8 F (36.6 C)   Wt 164 lb (74.4 kg)   SpO2 (!) 89%   BMI 21.64 kg/m       Objective:   Physical Exam Vitals signs and nursing note reviewed.  Constitutional:      Appearance: Normal appearance.  HENT:     Head: Normocephalic and atraumatic.  Cardiovascular:     Pulses: Normal pulses.     Heart sounds: Murmur present. No friction rub. No gallop.   Pulmonary:     Effort: Pulmonary effort is normal.  Breath sounds: Normal breath sounds.     Comments: 2 L via [place] Skin:    General: Skin is warm and dry.  Neurological:     General: No focal deficit present.     Mental Status: He is alert and oriented to person, place, and time.  Psychiatric:        Mood and Affect: Mood normal.        Behavior: Behavior normal.        Thought Content: Thought content normal.        Judgment: Judgment normal.       Assessment &  Plan:  1. Pneumonia of left lower lobe due to infectious organism [facility]) - We reviewed his hospital discharge notes, labs, imaging, and follow up appointments. All questions answered. Will get repeat chest xray today to look for resolution.  - DG Chest 2 View; Future - Follow up as needed  [name], NP

## 2018-08-27 ENCOUNTER — Emergency Department (HOSPITAL_COMMUNITY)
Admission: EM | Admit: 2018-08-27 | Discharge: 2018-08-27 | Disposition: A | Discharge status: Home / Self Care | Payer: Medicare Other | Attending: Emergency Medicine | Admitting: Emergency Medicine

## 2018-08-27 ENCOUNTER — Emergency Department (HOSPITAL_COMMUNITY): Payer: Medicare Other

## 2018-08-27 ENCOUNTER — Encounter (HOSPITAL_COMMUNITY): Payer: Self-pay | Admitting: Emergency Medicine

## 2018-08-27 ENCOUNTER — Other Ambulatory Visit: Payer: Self-pay

## 2018-08-27 DIAGNOSIS — Z79899 Other long term (current) drug therapy: Secondary | ICD-10-CM | POA: Diagnosis not present

## 2018-08-27 DIAGNOSIS — Z7982 Long term (current) use of aspirin: Secondary | ICD-10-CM | POA: Diagnosis not present

## 2018-08-27 DIAGNOSIS — R51 Headache: Secondary | ICD-10-CM | POA: Insufficient documentation

## 2018-08-27 DIAGNOSIS — J449 Chronic obstructive pulmonary disease, unspecified: Secondary | ICD-10-CM | POA: Insufficient documentation

## 2018-08-27 DIAGNOSIS — Z85118 Personal history of other malignant neoplasm of bronchus and lung: Secondary | ICD-10-CM | POA: Insufficient documentation

## 2018-08-27 DIAGNOSIS — I959 Hypotension, unspecified: Secondary | ICD-10-CM | POA: Diagnosis not present

## 2018-08-27 DIAGNOSIS — R4781 Slurred speech: Secondary | ICD-10-CM | POA: Diagnosis not present

## 2018-08-27 DIAGNOSIS — Z950 Presence of cardiac pacemaker: Secondary | ICD-10-CM | POA: Insufficient documentation

## 2018-08-27 DIAGNOSIS — I69311 Memory deficit following cerebral infarction: Secondary | ICD-10-CM | POA: Diagnosis not present

## 2018-08-27 DIAGNOSIS — I5042 Chronic combined systolic (congestive) and diastolic (congestive) heart failure: Secondary | ICD-10-CM | POA: Insufficient documentation

## 2018-08-27 DIAGNOSIS — Z87891 Personal history of nicotine dependence: Secondary | ICD-10-CM | POA: Insufficient documentation

## 2018-08-27 DIAGNOSIS — R42 Dizziness and giddiness: Secondary | ICD-10-CM | POA: Diagnosis not present

## 2018-08-27 DIAGNOSIS — I110 Hypertensive heart disease with heart failure: Secondary | ICD-10-CM | POA: Diagnosis not present

## 2018-08-27 DIAGNOSIS — R0902 Hypoxemia: Secondary | ICD-10-CM | POA: Diagnosis not present

## 2018-08-27 DIAGNOSIS — R519 Headache, unspecified: Secondary | ICD-10-CM

## 2018-08-27 DIAGNOSIS — Z7984 Long term (current) use of oral hypoglycemic drugs: Secondary | ICD-10-CM | POA: Diagnosis not present

## 2018-08-27 DIAGNOSIS — E119 Type 2 diabetes mellitus without complications: Secondary | ICD-10-CM | POA: Diagnosis not present

## 2018-08-27 DIAGNOSIS — R55 Syncope and collapse: Secondary | ICD-10-CM | POA: Diagnosis not present

## 2018-08-27 LAB — COMPREHENSIVE METABOLIC PANEL
ALT: 10 U/L (ref 0–44)
AST: 16 U/L (ref 15–41)
Albumin: 3.5 g/dL (ref 3.5–5.0)
Alkaline Phosphatase: 40 U/L (ref 38–126)
Anion gap: 9 (ref 5–15)
BUN: 18 mg/dL (ref 8–23)
CO2: 26 mmol/L (ref 22–32)
Calcium: 9 mg/dL (ref 8.9–10.3)
Chloride: 104 mmol/L (ref 98–111)
Creatinine, Ser: 1.04 mg/dL (ref 0.61–1.24)
GFR calc Af Amer: >60 mL/min (ref >60)
Glucose, Bld: 143 mg/dL — ABNORMAL HIGH (ref 70–99)
Potassium: 4.7 mmol/L (ref 3.5–5.1)
Sodium: 139 mmol/L (ref 135–145)
Total Bilirubin: 2 mg/dL — ABNORMAL HIGH (ref 0.3–1.2)
Total Protein: 6.2 g/dL — ABNORMAL LOW (ref 6.5–8.1)

## 2018-08-27 LAB — PROTIME-INR
INR: 1.19
Prothrombin Time: 14.9 seconds (ref 11.4–15.2)

## 2018-08-27 LAB — DIFFERENTIAL
Abs Immature Granulocytes: 0.02 10*3/uL (ref 0.00–0.07)
Basophils Absolute: 0.1 10*3/uL (ref 0.0–0.1)
Basophils Relative: 1 %
Eosinophils Absolute: 0.1 10*3/uL (ref 0.0–0.5)
Eosinophils Relative: 2 %
Immature Granulocytes: 0 %
Lymphocytes Relative: 8 %
Lymphs Abs: 0.5 10*3/uL — ABNORMAL LOW (ref 0.7–4.0)
Monocytes Absolute: 0.5 10*3/uL (ref 0.1–1.0)
Monocytes Relative: 8 %
Neutro Abs: 5.1 10*3/uL (ref 1.7–7.7)
Neutrophils Relative %: 81 %

## 2018-08-27 LAB — CBC
HCT: 36.8 % — ABNORMAL LOW (ref 39.0–52.0)
Hemoglobin: 10.6 g/dL — ABNORMAL LOW (ref 13.0–17.0)
MCH: 25.5 pg — ABNORMAL LOW (ref 26.0–34.0)
MCHC: 28.8 g/dL — ABNORMAL LOW (ref 30.0–36.0)
MCV: 88.5 fL (ref 80.0–100.0)
PLATELETS: 216 10*3/uL (ref 150–400)
RBC: 4.16 MIL/uL — AB (ref 4.22–5.81)
RDW: 18.7 % — ABNORMAL HIGH (ref 11.5–15.5)
WBC: 6.3 10*3/uL (ref 4.0–10.5)
nRBC: 0 % (ref 0.0–0.2)

## 2018-08-27 LAB — I-STAT TROPONIN, ED: Troponin i, poc: 0 ng/mL (ref 0.00–0.08)

## 2018-08-27 LAB — APTT: aPTT: 35 seconds (ref 24–36)

## 2018-08-27 MED ORDER — DIPHENHYDRAMINE HCL 50 MG/ML IJ SOLN
25.0000 mg | Freq: Once | INTRAMUSCULAR | Status: AC
  Administered 2018-08-27: 25 mg via INTRAVENOUS
  Filled 2018-08-27: qty 1

## 2018-08-27 MED ORDER — SODIUM CHLORIDE 0.9 % IV BOLUS
500.0000 mL | Freq: Once | INTRAVENOUS | Status: AC
  Administered 2018-08-27: 500 mL via INTRAVENOUS

## 2018-08-27 MED ORDER — METOCLOPRAMIDE HCL 5 MG/ML IJ SOLN
10.0000 mg | Freq: Once | INTRAMUSCULAR | Status: AC
  Administered 2018-08-27: 10 mg via INTRAVENOUS
  Filled 2018-08-27: qty 2

## 2018-08-27 MED ORDER — IOPAMIDOL (ISOVUE-370) INJECTION 76%
INTRAVENOUS | Status: AC
  Administered 2018-08-27: 75 mL via INTRAVENOUS
  Filled 2018-08-27: qty 100

## 2018-08-27 NOTE — ED Triage Notes (Signed)
Pt arrives to ED from home with complaints of a near syncopal episode this morning while he was sitting down. Pt stated that he got dizzy then got a 10/10 frontal headache. EMS reports pts BP on arrival was 86/54.

## 2018-08-27 NOTE — ED Notes (Signed)
Patient verbalizes understanding of discharge instructions. Opportunity for questioning and answers were provided. Armband removed by staff, pt discharged from ED.  

## 2018-08-27 NOTE — ED Provider Notes (Signed)
[name] EMERGENCY DEPARTMENT Provider Note   CSN: [id number] Arrival date & time:        History   Chief Complaint Chief Complaint  Patient presents with  . Near Syncope  . Headache    HPI [name] is a 73 y.o. male.  Patient with history of atrial fibrillation, implantable pacemaker, congestive heart failure, COPD, previous hemorrhagic stroke with long-term memory loss per family and overall slower to respond, stroke previously caused by Coumadin --currently on aspirin only, diabetes, pulmonary hypertension, known thoracic aortic aneurysm --presents to the emergency department today with complaints of headache, near syncopal episode.  Patient states that he developed a severe generalized headache this morning.  This occurred around 8 AM.  Patient walked into the den and asked his wife to call EMS.  Patient does not typically get headaches and his last severe headache was with his hemorrhagic stroke.  Patient currently denies vertigo or lightheadedness.  He states that he felt dizzy when he walked from his couch to the EMS stretcher.  This occurred when he stood up.  He did not have any full syncope reported by EMS or by family.  Patient's blood pressure on EMS arrival was 86/54.  This improved with time and blood pressure is normalized on arrival.  No reported neck pain or confusion.  No chest pain or shortness of breath.  No weakness in the arms of the legs.  Patient denies signs of stroke including: facial droop, slurred speech, aphasia, weakness/numbness in extremities, imbalance/trouble walking.      Past Medical History:  Diagnosis Date  . Atrial fibrillation ([place])    AV Node ablation January, 2010, for rapid atrial fib  . Atrial septal defect    Closed with surgery January, 2010  . Automatic implantable cardioverter-defibrillator in situ    LV dysfunction and pacer needed for AV node lesion  . Cardiomyopathy    non-ischemic  . CHF (congestive  heart failure) ([place])   . Chronic combined systolic and diastolic CHF (congestive heart failure) ([place])   . Colon polyps   . COPD (chronic obstructive pulmonary disease) (HCC)    O2- 2 liters, nasal cannula, q night   . COPD GOLD II 01/11/2007   PFT's 09/25/13  FEV1  2.18 (61%) ratio 64 no change p B2 and DLC0  32% corrects to 68% - trial off advair and acei rec starting  08/23/2013     . CVA (cerebral vascular accident) ([place]) 2009   denies residual on 08/14/2013  . Diabetes mellitus without complication ([place])    type 2  . Dyslipidemia   . Dysrhythmia   . Ejection fraction < 50%   . Endocarditis    Bacterial, 2009  . Headache(784.0)    related to stroke only  . HLD (hyperlipidemia)   . Hypertension   . Intracranial hemorrhage (HCC)    Coumadin cannot be used because of the history of his bleed  . Lung cancer ([place]) 11/29/2013   T1N0 Stage Ia non-small cell carcinoma left lung treated with wedge resection  . Mitral regurgitation    Severe symptomatic primary MR due to bacterial endocarditis, treated w/ MVR // Echo 1/18 EF 40-45, diffuse HK, dilated aorta at 42 mm/aortic root 47 mm; linear echogenic structure in ascending aorta - suspect reverberation artifact / consider CT or TEE to rule out dissection flap, bioprosthetic MVR with mean gradient 3, severe LAE, low normal RVSF, severe RAE, moderate TR, PASP 42  . Myocardial infarction ([place])  2010  . Pacemaker    combo pacer and icd  . Permanent atrial fibrillation    Originally Coumadin use for atrial fibrillation  //   he had intracerebral hemorrhage with an INR of 2.3 June, 2009. Anticoagulation could no longer be used.  //  Rapid atrial fibrillation after inferior MI October, 2010..........Marland Kitchen AV node ablation done at that time with ICD pacemaker placed (EF 35%).   //   Left atrial appendage tied off at the time of mitral valve surgery January, 2010 (maze pro  . Pneumonia 07/2018  . Prosthetic valve dysfunction    Mild mitral stenosis  .  Pulmonary hypertension (HCC)    Moderate  . Renal artery stenosis (HCC)    Mild by history  . 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 Spontaneous pneumothorax    right thoracotomy - distant past  . Status post minimally invasive mitral valve replacement with bioprosthetic valve    33 mm Medtronic Mosaic porcine bioprosthesis placed via right mini thoracotomy for bacterial endocarditis complicated by severe MR and CHF   . Thoracic aortic aneurysm ([place]) 08/11/2016   a - Chest CTA 1/18:  Aneurysmal dilatation of aortic root is noted at 5.1 cm.     Patient Active Problem List   Diagnosis Date Noted  . COPD with acute exacerbation ([place]) 08/01/2018  . Pulmonary edema 07/30/2018  . Health care maintenance 05/09/2018  . Gastritis and gastroduodenitis   . Diverticulum of duodenum   . Aortic root dilatation ([place])   . 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 Atherosclerosis of aorta ([place]) 08/11/2016  . Orthostatic hypotension 09/26/2015  . Near syncope 09/24/2015  . Syncope 09/24/2015  . Hepatic cirrhosis ([place]) 09/25/2014  . History of colonic polyps 09/25/2014  . Serum total bilirubin elevated 06/30/2014  . Follow-up examination, following unspecified surgery 01/27/2014  . Lung cancer ([place]) 11/29/2013  . Prosthetic valve dysfunction   . Chronic combined systolic and diastolic CHF (congestive heart failure) ([place])   . Diabetes ([place]) 11/04/2013  . Myocardial infarction ([place]) 11/01/2013  . Cardiomyopathy, nonischemic ([place]) 11/01/2013  . Acute on chronic respiratory failure ([place]) 08/15/2013  . H/O intracranial hemorrhage 08/13/2013  . H/O endocarditis 08/13/2013  . H/O: CVA (cerebrovascular accident) 08/13/2013  . Community acquired pneumonia 08/13/2013  . Chronic respiratory failure assoc with chf/ PAH 08/12/2013  . H/O atrioventricular nodal ablation   . S/P MVR (mitral valve replacement)   . Hypertension   . Dyslipidemia   . Permanent atrial fibrillation   . Ejection fraction < 50%     . ICD (implantable cardioverter-defibrillator), biventricular, in situ   . Renal artery stenosis ([place])   . Pulmonary hypertension ([place])   . Primary cancer of left upper lobe of lung ([place]) 04/20/2009  . COLONIC POLYPS, ADENOMATOUS 03/22/2007  . COPD GOLD II 01/11/2007  . GERD 01/11/2007    Past Surgical History:  Procedure Laterality Date  . APPENDECTOMY    . ASD REPAIR, SECUNDUM  07/17/2008   pericardial patch closure of ASD  . AV NODE ABLATION  07/2008   for rapid atrial fib  . CARDIAC CATHETERIZATION    . CARDIAC DEFIBRILLATOR PLACEMENT  ~ [address] [employer]  . CARDIAC VALVE REPLACEMENT    . CATARACT EXTRACTION W/ INTRAOCULAR LENS  IMPLANT, BILATERAL Bilateral   . ESOPHAGOGASTRODUODENOSCOPY (EGD) WITH PROPOFOL N/A 09/29/2016   Procedure: ESOPHAGOGASTRODUODENOSCOPY (EGD) WITH PROPOFOL;  Surgeon: [name], MD;  Location: [facility] ENDOSCOPY;  Service: Endoscopy;  Laterality: N/A;  .  HERNIA REPAIR    . IMPLANTABLE CARDIOVERTER DEFIBRILLATOR (ICD) GENERATOR CHANGE N/A 02/06/2012   Procedure: ICD GENERATOR CHANGE;  Surgeon: [name], MD;  Location: [facility] CATH LAB;  Service: Cardiovascular;  Laterality: N/A;  . INSERT / REPLACE / REMOVE PACEMAKER    . MASS BIOPSY Left    neck mass  . MITRAL VALVE REPLACEMENT Right 07/17/2008   19mm Medtronic Mosaic porcine bioprosthesis  . PENILE PROSTHESIS IMPLANT    . RIGHT HEART CATHETERIZATION N/A 08/16/2013   Procedure: RIGHT HEART CATH;  Surgeon: Josue Hector, MD;  Location: [facility] CATH LAB;  Service: Cardiovascular;  Laterality: N/A;  . TEE WITHOUT CARDIOVERSION N/A 09/06/2016   Procedure: TRANSESOPHAGEAL ECHOCARDIOGRAM (TEE);  Surgeon: Dorothy Spark, MD;  Location: [facility] ENDOSCOPY;  Service: Cardiovascular;  Laterality: N/A;  . THORACOTOMY Right 1970's   spontaneous pneumothorax - while in the [place]  . TOE SURGERY     left foot hammer toe  . TONSILLECTOMY    . VIDEO ASSISTED THORACOSCOPY (VATS)/WEDGE RESECTION Left 11/29/2013   Procedure: Video  assisted thoracoscopy for wedge resection; mini thoracotomy;  Surgeon: [name], MD;  Location: [facility];  Service: Thoracic;  Laterality: Left;        Home Medications    Prior to Admission medications   Medication Sig Start Date End Date Taking? Authorizing Provider  acetaminophen (TYLENOL) 500 MG tablet Take 500-1,000 mg by mouth every 6 (six) hours as needed for moderate pain or headache.    [provider]  albuterol (PROVENTIL HFA) 108 (90 Base) MCG/ACT inhaler Inhale 1-2 puffs into the lungs every 6 (six) hours as needed for wheezing or shortness of breath.    [provider]  amoxicillin (AMOXIL) 500 MG capsule Take 2,000 mg by mouth See admin instructions. Take 2,000 mg by mouth one hour prior to dental procedures    [provider]  aspirin EC 81 MG tablet Take 1 tablet (81 mg total) by mouth daily. 12/07/12   Carlena Bjornstad, MD  atorvastatin (LIPITOR) 10 MG tablet TAKE 1 TABLET BY MOUTH EVERY DAY Patient taking differently: Take 10 mg by mouth at bedtime.  04/18/18   Nafziger, Tommi Rumps, NP  budesonide-formoterol (SYMBICORT) 160-4.5 MCG/ACT inhaler INHALE 2 PUFFS INTO THE LUNGS TWICE A DAY Patient taking differently: Inhale 2 puffs into the lungs 2 (two) times daily.  05/09/18   Lauraine Rinne, NP  carvedilol (COREG) 3.125 MG tablet TAKE 1 TABLET BY MOUTH TWICE A DAY WITH MEALS Patient taking differently: Take 3.125 mg by mouth 2 (two) times daily with a meal.  10/12/16   Nafziger, Tommi Rumps, NP  cetirizine (ZYRTEC) 10 MG tablet Take 10 mg by mouth daily.    [provider]  Dextromethorphan-guaiFENesin ([employer] TUSSIN DM COUGH/CHEST PO) Take 5-10 mLs by mouth every 6 (six) hours as needed (for congestion or to loosen mucous).    [provider]  furosemide (LASIX) 40 MG tablet TAKE 1 TABLET BY MOUTH EVERY DAY Patient taking differently: Take 40 mg by mouth daily.  04/18/18   Nafziger, Tommi Rumps, NP  metFORMIN (GLUCOPHAGE) 1000 MG tablet TAKE 1 TABLET BY  MOUTH TWICE A DAY WITH MEALS Patient taking differently: Take 1,000 mg by mouth 2 (two) times daily with a meal.  04/16/18   Nafziger, Tommi Rumps, NP  nitroGLYCERIN (NITROSTAT) 0.4 MG SL tablet Place 0.4 mg under the tongue every 5 (five) minutes x 3 doses as needed for chest pain.     [provider]  Olopatadine  HCl (PAZEO) 0.7 % SOLN Apply 1 drop to eye daily. Patient taking differently: Place 1 drop into both eyes daily.  01/03/18   Martinique, Betty G, MD  OXYGEN Inhale 2 L into the lungs continuous.     [provider]  valsartan (DIOVAN) 40 MG tablet TAKE 1 TABLET BY MOUTH EVERY DAY Patient taking differently: Take 40 mg by mouth daily.  07/02/18   [name], NP    Family History Family History  Problem Relation Age of Onset  . Stomach cancer Father   . Stroke Mother     Social History Social History   Tobacco Use  . Smoking status: Former Smoker    Packs/day: 1.00    Years: 45.00    Pack years: 45.00    Types: Cigarettes    Last attempt to quit: 08/11/2013    Years since quitting: 5.0  . Smokeless tobacco: Never Used  . Tobacco comment: 08/14/2013 "quit smoking in 2009"  Substance Use Topics  . Alcohol use: No    Alcohol/week: 0.0 standard drinks    Comment: 08/14/2013 "used to drink beer; quit:in 1982"  . Drug use: No     Allergies   Anticoagulant compound; Other; and Warfarin sodium   Review of Systems Review of Systems  Constitutional: Negative for fever.  HENT: Negative for congestion, dental problem, rhinorrhea and sinus pressure.   Eyes: Negative for photophobia, discharge, redness and visual disturbance.  Respiratory: Negative for shortness of breath.   Cardiovascular: Negative for chest pain.  Gastrointestinal: Negative for nausea and vomiting.  Musculoskeletal: Negative for gait problem, neck pain and neck stiffness.  Skin: Negative for rash.  Neurological: Positive for dizziness, light-headedness and headaches. Negative for syncope (near  syncope), speech difficulty, weakness and numbness.  Psychiatric/Behavioral: Negative for confusion.     Physical Exam Updated Vital Signs BP 125/65 (BP Location: Right Arm)   Pulse 71   Temp 97.8 F (36.6 C) (Oral)   Resp 17   SpO2 95%   Physical Exam Vitals signs and nursing note reviewed.  Constitutional:      Appearance: He is well-developed.  HENT:     Head: Normocephalic and atraumatic.     Right Ear: Tympanic membrane, ear canal and external ear normal.     Left Ear: Tympanic membrane, ear canal and external ear normal.     Nose: Nose normal.     Mouth/Throat:     Pharynx: Uvula midline.  Eyes:     General: Lids are normal.     Conjunctiva/sclera: Conjunctivae normal.     Pupils: Pupils are equal, round, and reactive to light.  Neck:     Musculoskeletal: Normal range of motion and neck supple.  Cardiovascular:     Rate and Rhythm: Normal rate and regular rhythm.  Pulmonary:     Effort: Pulmonary effort is normal.     Breath sounds: Normal breath sounds.  Abdominal:     Palpations: Abdomen is soft.     Tenderness: There is no abdominal tenderness.  Musculoskeletal: Normal range of motion.     Cervical back: He exhibits normal range of motion, no tenderness and no bony tenderness.  Skin:    General: Skin is warm and dry.  Neurological:     Mental Status: He is alert and oriented to person, place, and time.     GCS: GCS eye subscore is 4. GCS verbal subscore is 5. GCS motor subscore is 6.     Cranial Nerves: No cranial nerve  deficit.     Sensory: No sensory deficit.     Motor: No abnormal muscle tone.     Coordination: Coordination normal.     Gait: Gait normal.     Deep Tendon Reflexes: Reflexes are normal and symmetric.      ED Treatments / Results  Labs (all labs ordered are listed, but only abnormal results are displayed) Labs Reviewed  CBC - Abnormal; Notable for the following components:      Result Value   RBC 4.16 (*)    Hemoglobin 10.6 (*)      HCT 36.8 (*)    MCH 25.5 (*)    MCHC 28.8 (*)    RDW 18.7 (*)    All other components within normal limits  DIFFERENTIAL - Abnormal; Notable for the following components:   Lymphs Abs 0.5 (*)    All other components within normal limits  COMPREHENSIVE METABOLIC PANEL - Abnormal; Notable for the following components:   Glucose, Bld 143 (*)    Total Protein 6.2 (*)    Total Bilirubin 2.0 (*)    All other components within normal limits  PROTIME-INR  APTT  I-STAT TROPONIN, ED    EKG EKG Interpretation  Date/Time:  Monday August 27 2018 10:45:57 EST Ventricular Rate:  70 PR Interval:    QRS Duration: 149 QT Interval:  453 QTC Calculation: 489 R Axis:   -103 Text Interpretation:  Sinus rhythm Right bundle branch block Inferior infarct, old No significant change since last tracing Confirmed by Gareth Morgan [phone]) on 08/27/2018 11:06:53 AM   Radiology Ct Angio Head W Or Wo Contrast  Result Date: 08/27/2018 CLINICAL DATA:  Acute headache.  Near syncope EXAM: CT ANGIOGRAPHY HEAD AND NECK TECHNIQUE: Multidetector CT imaging of the head and neck was performed using the standard protocol during bolus administration of intravenous contrast. Multiplanar CT image reconstructions and MIPs were obtained to evaluate the vascular anatomy. Carotid stenosis measurements (when applicable) are obtained utilizing NASCET criteria, using the distal internal carotid diameter as the denominator. CONTRAST:  75 mL Isovue 370 IV COMPARISON:  CT head 09/24/2015 FINDINGS: CT HEAD FINDINGS Brain: Generalized atrophy. Chronic microvascular ischemia in the white matter. Chronic infarcts in the left posterior temporal/parietal lobe and high right parietal lobe. Negative for acute infarct, hemorrhage, mass. Vascular: Atherosclerotic calcification. Negative for hyperdense vessel Skull: Negative Sinuses: Negative Orbits: Negative for mass.  Bilateral cataract surgery Review of the MIP images confirms the above  findings CTA NECK FINDINGS Aortic arch: Mild atherosclerotic disease in the aortic arch. No aneurysm or dissection. Right carotid system: Widely patent without stenosis or irregularity Left carotid system: Widely patent without stenosis. Mild atherosclerotic calcification left carotid bulb. Vertebral arteries: Both vertebral arteries patent to the basilar without stenosis. Left vertebral artery origin from the aortic arch. Skeleton: No acute abnormality. Other neck: 12 mm right thyroid nodule. No adenopathy or mass in the neck. Upper chest: Apical emphysema and scarring bilaterally. Small left effusion and left upper lobe infiltrate or atelectasis posteriorly. Review of the MIP images confirms the above findings CTA HEAD FINDINGS Anterior circulation: Atherosclerotic calcification in the cavernous carotid bilaterally with mild stenosis. Anterior and middle cerebral arteries patent bilaterally without significant stenosis or large vessel occlusion. Posterior circulation: Both vertebral arteries patent to the basilar. PICA patent bilaterally. Basilar widely patent. Superior cerebellar and posterior cerebral arteries patent bilaterally without stenosis. Venous sinuses: Patent Anatomic variants: Negative for cerebral aneurysm. Delayed phase: Normal enhancement on delayed imaging Review of the MIP images  confirms the above findings IMPRESSION: 1. Atrophy and chronic ischemic change.  No acute abnormality 2. No significant carotid or vertebral artery stenosis in the neck 3. Negative for emergent large vessel occlusion. No significant intracranial stenosis. Electronically Signed   By: Franchot Gallo M.D.   On: 08/27/2018 13:01   Ct Angio Neck W And/or Wo Contrast  Result Date: 08/27/2018 CLINICAL DATA:  Acute headache.  Near syncope EXAM: CT ANGIOGRAPHY HEAD AND NECK TECHNIQUE: Multidetector CT imaging of the head and neck was performed using the standard protocol during bolus administration of intravenous contrast.  Multiplanar CT image reconstructions and MIPs were obtained to evaluate the vascular anatomy. Carotid stenosis measurements (when applicable) are obtained utilizing NASCET criteria, using the distal internal carotid diameter as the denominator. CONTRAST:  75 mL Isovue 370 IV COMPARISON:  CT head 09/24/2015 FINDINGS: CT HEAD FINDINGS Brain: Generalized atrophy. Chronic microvascular ischemia in the white matter. Chronic infarcts in the left posterior temporal/parietal lobe and high right parietal lobe. Negative for acute infarct, hemorrhage, mass. Vascular: Atherosclerotic calcification. Negative for hyperdense vessel Skull: Negative Sinuses: Negative Orbits: Negative for mass.  Bilateral cataract surgery Review of the MIP images confirms the above findings CTA NECK FINDINGS Aortic arch: Mild atherosclerotic disease in the aortic arch. No aneurysm or dissection. Right carotid system: Widely patent without stenosis or irregularity Left carotid system: Widely patent without stenosis. Mild atherosclerotic calcification left carotid bulb. Vertebral arteries: Both vertebral arteries patent to the basilar without stenosis. Left vertebral artery origin from the aortic arch. Skeleton: No acute abnormality. Other neck: 12 mm right thyroid nodule. No adenopathy or mass in the neck. Upper chest: Apical emphysema and scarring bilaterally. Small left effusion and left upper lobe infiltrate or atelectasis posteriorly. Review of the MIP images confirms the above findings CTA HEAD FINDINGS Anterior circulation: Atherosclerotic calcification in the cavernous carotid bilaterally with mild stenosis. Anterior and middle cerebral arteries patent bilaterally without significant stenosis or large vessel occlusion. Posterior circulation: Both vertebral arteries patent to the basilar. PICA patent bilaterally. Basilar widely patent. Superior cerebellar and posterior cerebral arteries patent bilaterally without stenosis. Venous sinuses: Patent  Anatomic variants: Negative for cerebral aneurysm. Delayed phase: Normal enhancement on delayed imaging Review of the MIP images confirms the above findings IMPRESSION: 1. Atrophy and chronic ischemic change.  No acute abnormality 2. No significant carotid or vertebral artery stenosis in the neck 3. Negative for emergent large vessel occlusion. No significant intracranial stenosis. Electronically Signed   By: Franchot Gallo M.D.   On: 08/27/2018 13:01    Procedures Procedures (including critical care time)  Medications Ordered in ED Medications  iopamidol (ISOVUE-370) 76 % injection (75 mLs Intravenous Contrast Given 08/27/18 1235)  metoCLOPramide (REGLAN) injection 10 mg (10 mg Intravenous Given 08/27/18 1530)  diphenhydrAMINE (BENADRYL) injection 25 mg (25 mg Intravenous Given 08/27/18 1530)  sodium chloride 0.9 % bolus 500 mL (500 mLs Intravenous New Bag/Given 08/27/18 1530)     Initial Impression / Assessment and Plan / ED Course  I have reviewed the triage vital signs and the nursing notes.  Pertinent labs & imaging results that were available during my care of the patient were reviewed by me and considered in my medical decision making (see chart for details).     Patient seen and examined. Work-up initiated.  Patient is a poor historian.  Will attempt to obtain more history from family when they arrive.  Discussed case with Dr. Billy Fischer.  Advises CT angio of the head and neck.  Vital signs reviewed and are as follows: BP 111/64   Pulse 70   Temp 97.8 F (36.6 C) (Oral)   Resp 17   SpO2 98%   11:47 AM patient reexamined.  Exam is stable.  Additional details obtained from family.  Awaiting renal function.  3:03 PM Work-up negative to this point. Pt re-checked. Continues to c/o HA. Reglan/benadryl ordered. Pt d/w Dr. Billy Fischer who will see.   4:16 PM Patient doing well, says headache is feeling better.  He has not had any negative reactions from the migraine  cocktail.  Reiterated findings today with patient and daughter at bedside.  Strongly encouraged PCP follow-up for recheck in the next 2 days.  We discussed signs and symptoms which should cause return including development of chest pain, shortness of breath, any strokelike symptoms. Patient counseled to return if they have weakness in their arms or legs, slurred speech, trouble walking or talking, confusion, trouble with their balance, or if they have any other concerns. Patient verbalizes understanding and agrees with plan.   They are ready and they are comfortable with discharge to home.  Final Clinical Impressions(s) / ED Diagnoses   Final diagnoses:  Acute nonintractable headache, unspecified headache type   HA: Patient with severe headache with short-lived episode of dizziness.  No full syncope.  Patient has a history of hemorrhagic stroke and is no longer taking any anticoagulants.  He is on an aspirin daily.  Patient presented within 6 hours of symptom onset.  He had CT angio of the head and neck which did not demonstrate any acute bleed or aneurysm.  Headache treated with migraine cocktail.  Patient has a normal complete neurological exam, normal vital signs, normal level of consciousness, no signs of meningismus, is well-appearing/non-toxic appearing, no signs of trauma.  Patient is at his baseline mental status and functioning per family who is at bedside.  They are comfortable with going home tonight and following up with PCP.   Remainder of work-up today unremarkable.  No signs of ACS and symptoms are not really suspicious for this.   No dangerous or life-threatening conditions suspected or identified by history, physical exam, and by work-up. No indications for hospitalization identified.    ED Discharge Orders    None       Carlisle Cater, Hershal Coria 08/27/18 1622    Gareth Morgan, MD 08/28/18 1040

## 2018-08-27 NOTE — ED Notes (Signed)
Pt's LKN [id number] today per EMS

## 2018-08-27 NOTE — Discharge Instructions (Signed)
Please read and follow all provided instructions.  Your diagnoses today include:  1. Acute nonintractable headache, unspecified headache type     Tests performed today include:  CT of your head and neck which was normal and did not show any serious cause of your headache such as bleeding, stroke, or aneurysm  Blood counts and electrolytes  Blood test for the heart attack - normal  Vital signs. See below for your results today.   Medications:  In the Emergency Department you received:  Reglan - antinausea/headache medication  Benadryl - antihistamine to counteract potential side effects of reglan  Take any prescribed medications only as directed.  Additional information:  Follow any educational materials contained in this packet.  You are having a headache. No specific cause was found today for your headache. It may have been a migraine or other cause of headache. Stress, anxiety, fatigue, and depression are common triggers for headaches.   Your headache today does not appear to be life-threatening or require hospitalization, but often the exact cause of headaches is not determined in the emergency department. Therefore, follow-up with your doctor is very important to find out what may have caused your headache and whether or not you need any further diagnostic testing or treatment.   Sometimes headaches can appear benign (not harmful), but then more serious symptoms can develop which should prompt an immediate re-evaluation by your doctor or the emergency department.  BE VERY CAREFUL not to take multiple medicines containing Tylenol (also called acetaminophen). Doing so can lead to an overdose which can damage your liver and cause liver failure and possibly death.   Follow-up instructions: Please follow-up with your primary care provider in the next 3 days for further evaluation of your symptoms.   Return instructions:   Please return to the Emergency Department if you  experience worsening symptoms.  Return if the medications do not resolve your headache, if it recurs, or if you have multiple episodes of vomiting or cannot keep down fluids.  Return if you have a change from the usual headache.  RETURN IMMEDIATELY IF you:  Develop a sudden, severe headache  Develop confusion or become poorly responsive or faint  Develop a fever above 100.78F or problem breathing  Have a change in speech, vision, swallowing, or understanding  Develop new weakness, numbness, tingling, incoordination in your arms or legs  Have a seizure  Please return if you have any other emergent concerns.  Additional Information:  Your vital signs today were: BP 114/69    Pulse 73    Temp 97.8 F (36.6 C) (Oral)    Resp 19    SpO2 96%  If your blood pressure (BP) was elevated above 135/85 this visit, please have this repeated by your doctor within one month. --------------

## 2018-08-29 ENCOUNTER — Other Ambulatory Visit: Payer: Self-pay | Admitting: *Deleted

## 2018-08-29 ENCOUNTER — Ambulatory Visit (INDEPENDENT_AMBULATORY_CARE_PROVIDER_SITE_OTHER): Payer: Medicare Other

## 2018-08-29 DIAGNOSIS — I428 Other cardiomyopathies: Secondary | ICD-10-CM

## 2018-08-29 DIAGNOSIS — I5022 Chronic systolic (congestive) heart failure: Secondary | ICD-10-CM

## 2018-08-29 DIAGNOSIS — Z953 Presence of xenogenic heart valve: Secondary | ICD-10-CM

## 2018-08-29 DIAGNOSIS — I712 Thoracic aortic aneurysm, without rupture, unspecified: Secondary | ICD-10-CM

## 2018-08-29 DIAGNOSIS — I4821 Permanent atrial fibrillation: Secondary | ICD-10-CM

## 2018-08-29 NOTE — Progress Notes (Signed)
Ordered 2 D Echo per Dr Marlou Porch due after 08/02/2019

## 2018-08-30 LAB — CUP PACEART REMOTE DEVICE CHECK
Battery Remaining Longevity: 28 mo
Battery Remaining Percentage: 31 %
Battery Voltage: 2.87 V
Date Time Interrogation Session: 20200219090017
HIGH POWER IMPEDANCE MEASURED VALUE: 59 Ohm
HighPow Impedance: 59 Ohm
Implantable Lead Implant Date: 20100113
Implantable Lead Implant Date: 20100113
Implantable Lead Location: 753858
Implantable Lead Location: 753860
Implantable Lead Model: 7122
Lead Channel Impedance Value: 360 Ohm
Lead Channel Pacing Threshold Amplitude: 0.625 V
Lead Channel Pacing Threshold Amplitude: 1.25 V
Lead Channel Pacing Threshold Pulse Width: 0.5 ms
Lead Channel Sensing Intrinsic Amplitude: 11.9 mV
Lead Channel Setting Pacing Amplitude: 2 V
Lead Channel Setting Pacing Amplitude: 2.25 V
Lead Channel Setting Pacing Pulse Width: 0.5 ms
Lead Channel Setting Pacing Pulse Width: 0.5 ms
Lead Channel Setting Sensing Sensitivity: 0.5 mV
MDC IDC MSMT LEADCHNL LV IMPEDANCE VALUE: 760 Ohm
MDC IDC MSMT LEADCHNL RV PACING THRESHOLD PULSEWIDTH: 0.5 ms
MDC IDC PG IMPLANT DT: 20130729
Pulse Gen Serial Number: 7053988

## 2018-09-04 IMAGING — US US ABDOMEN COMPLETE
1 series · 13 of 25 positions shown · non-contrast
Comparison: Ultrasound June 25, 2015.

CLINICAL DATA: Hepatic cirrhosis.

EXAM:
ABDOMEN ULTRASOUND COMPLETE

[Series 1: us abdomen complete · 0.26mm/px · 13 of 43 slices shown]
[im 1/43]
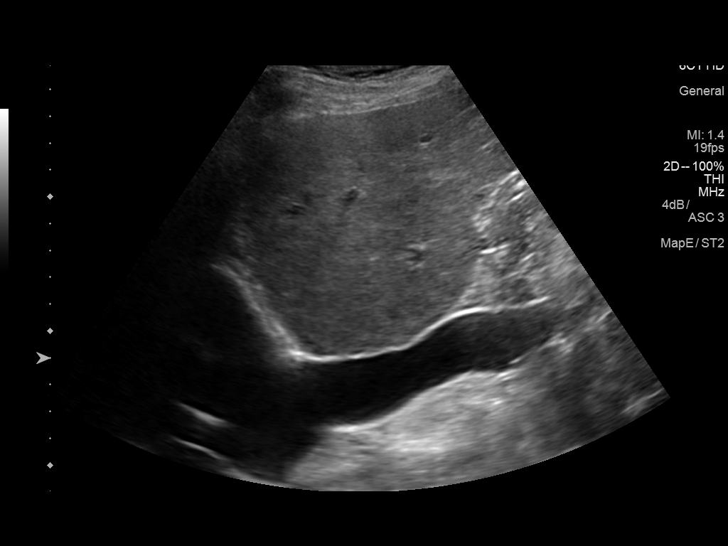
[im 4/43]
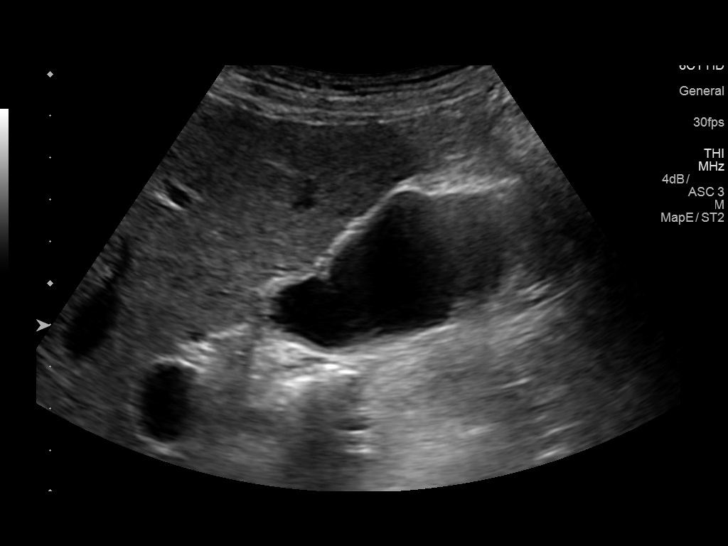
[im 8/43]
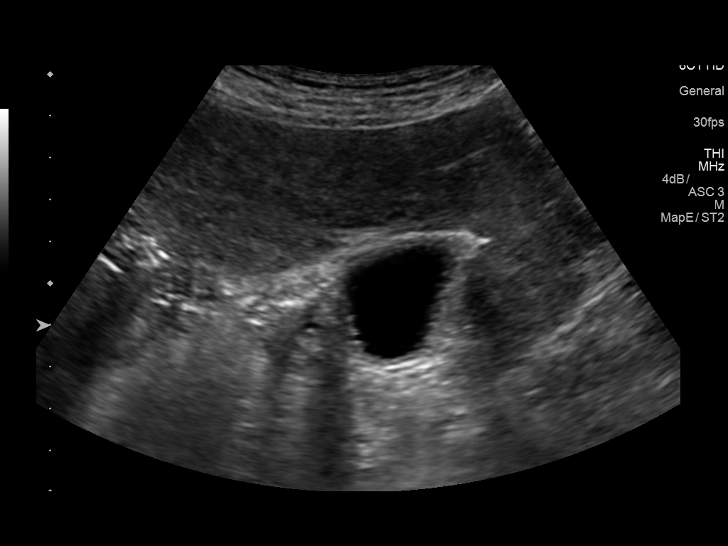
[im 11/43]
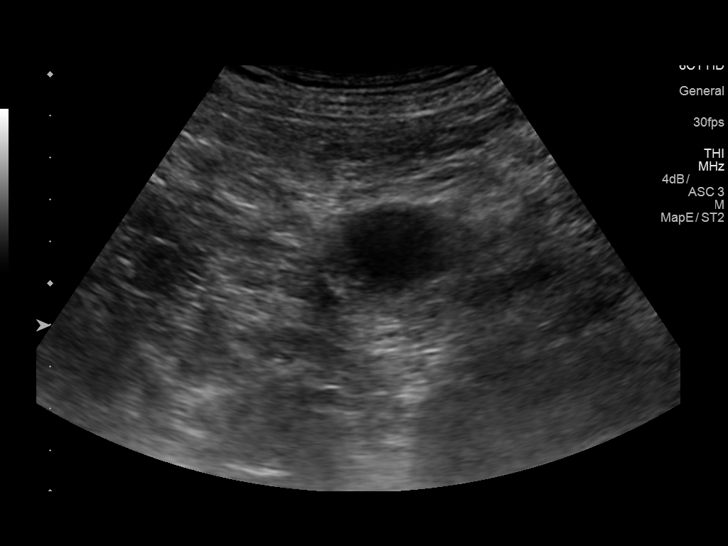
[im 15/43]
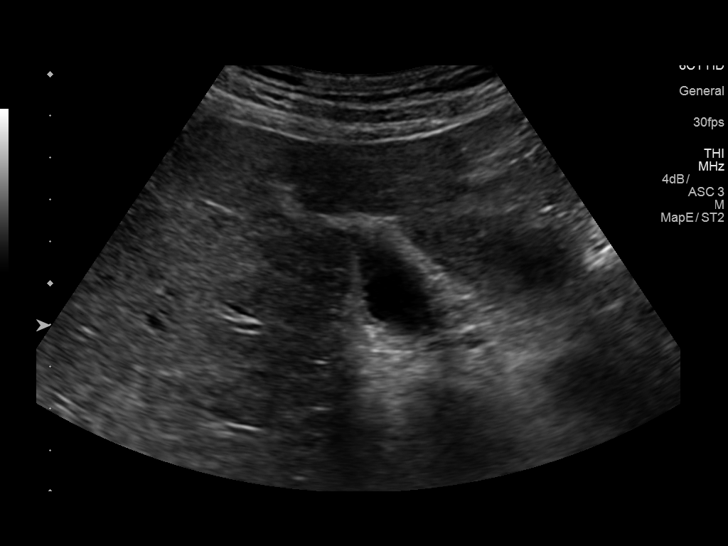
[im 18/43]
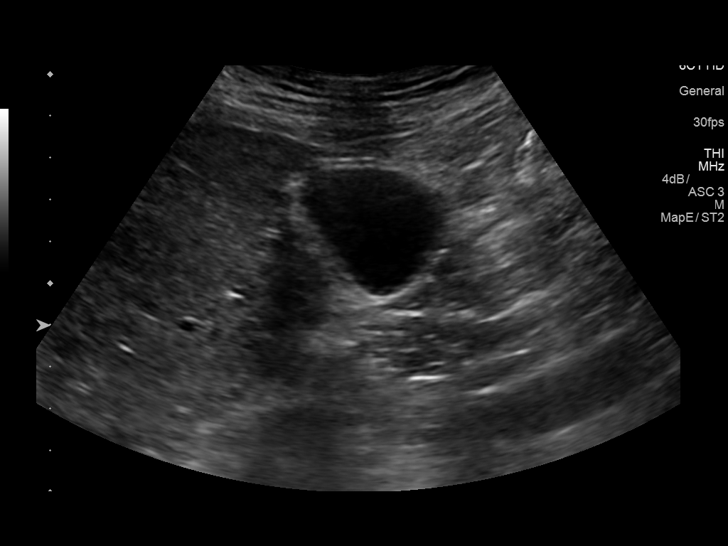
[im 22/43]
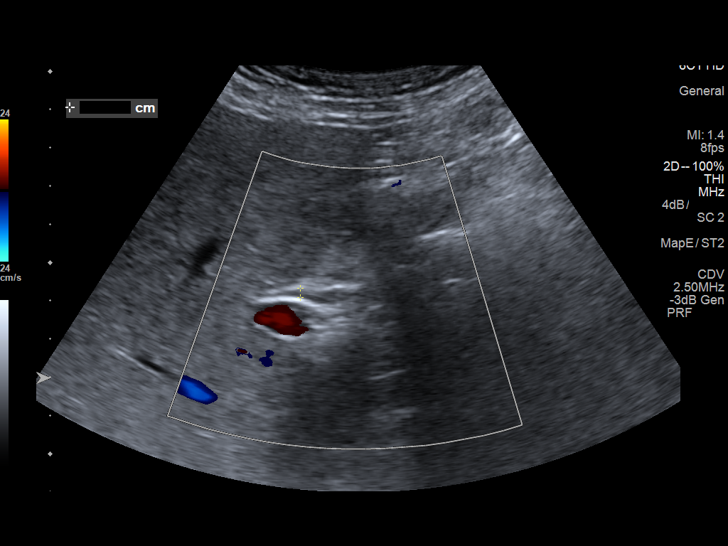
[im 25/43]
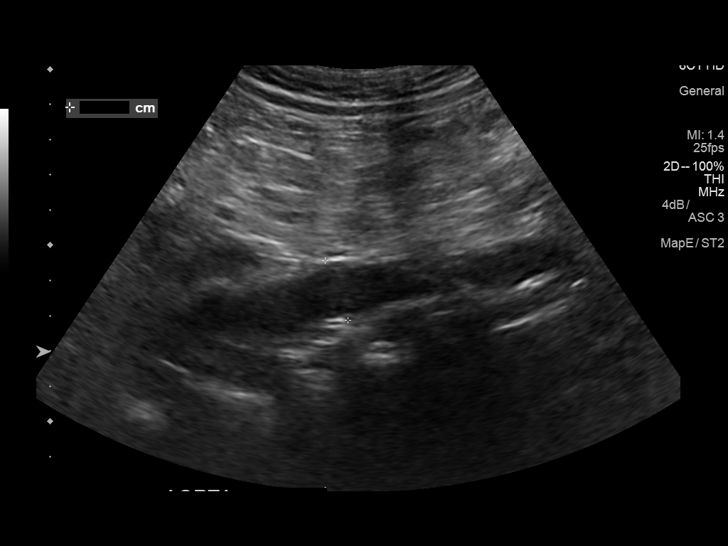
[im 29/43]
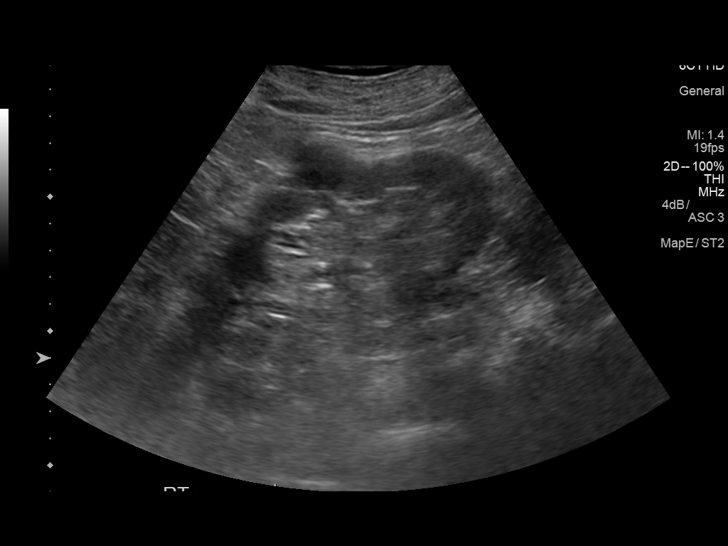
[im 32/43]
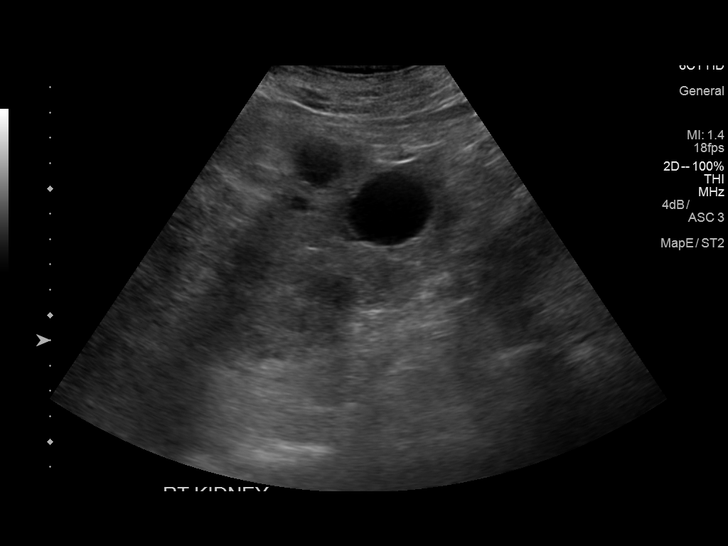
[im 36/43]
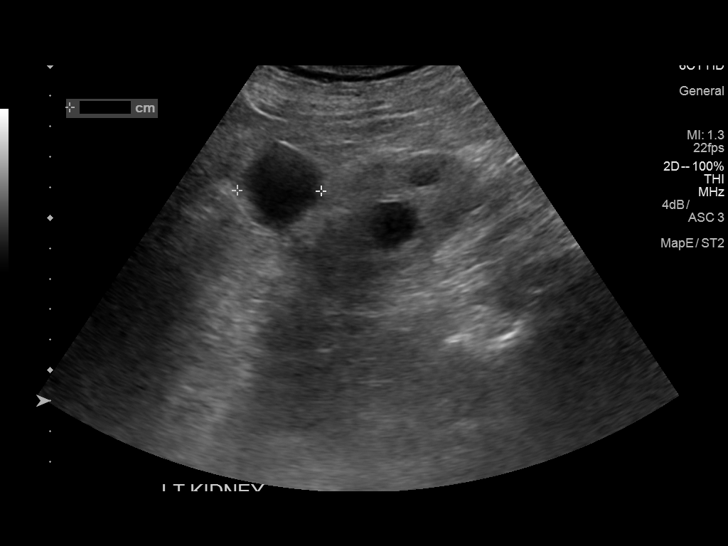
[im 39/43]
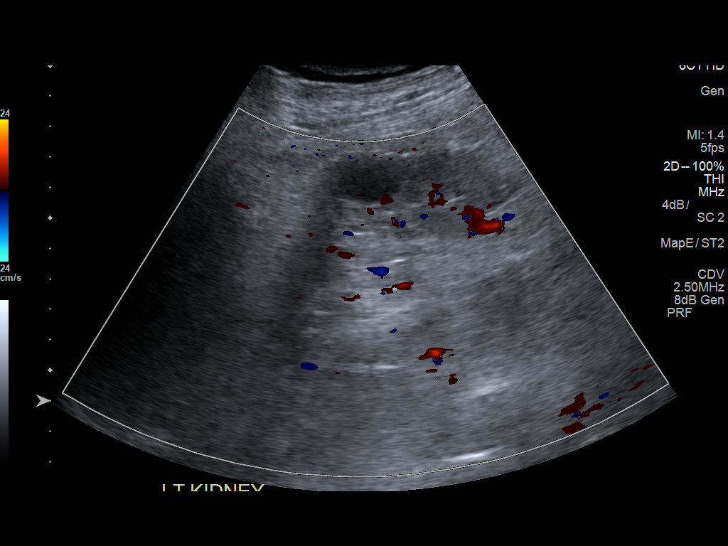
[im 43/43]
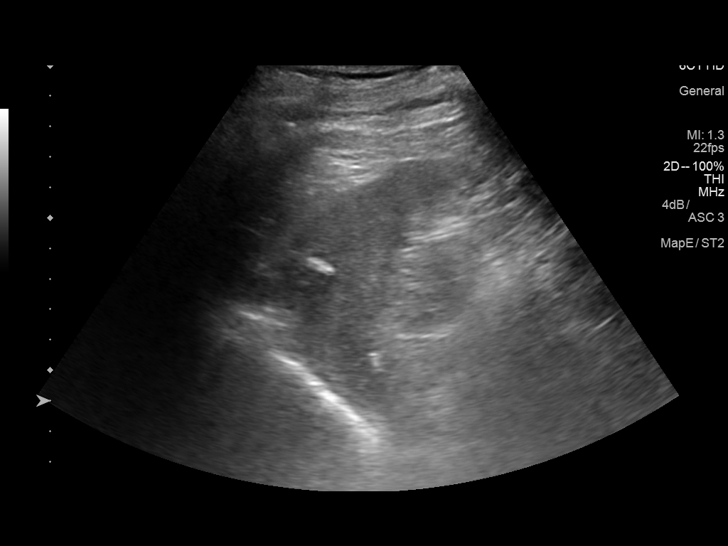

[13 of 25 positions shown; findings below may reference images not displayed]

FINDINGS: Gallbladder: No gallstones or wall thickening visualized. No
sonographic Murphy sign noted by sonographer.

Common bile duct: Diameter: 1.7 mm which is within normal limits.

Liver: No focal lesion identified. Increased echogenicity of hepatic
parenchyma is noted with nodular hepatic contours consistent with
hepatic cirrhosis.

IVC: No abnormality visualized.

Pancreas: Visualized portion unremarkable.

Spleen: Size is within normal limits. Stable linear echogenic
density is noted in the spleen compared to prior exam consistent
with old infarction. No other focal abnormality seen.

Right Kidney: Length: 13.6 cm. 3.9 cm simple cyst is noted.
Echogenicity within normal limits. No mass or hydronephrosis
visualized.

Left Kidney: Length: 13.8 cm. 3.1 cm simple cyst is noted.
Echogenicity within normal limits. No mass or hydronephrosis
visualized.

Abdominal aorta: No aneurysm visualized.

Other findings: None.
IMPRESSION: Increased echogenicity of hepatic parenchyma with nodular hepatic
contours consistent with hepatic cirrhosis. Bilateral simple renal
cysts are noted. No other significant abnormality seen in the
abdomen.

## 2018-09-05 NOTE — Progress Notes (Signed)
Remote ICD transmission.   

## 2018-09-07 ENCOUNTER — Encounter: Payer: Self-pay | Admitting: Cardiology

## 2018-09-19 ENCOUNTER — Other Ambulatory Visit: Payer: Self-pay | Admitting: *Deleted

## 2018-09-19 DIAGNOSIS — I77810 Thoracic aortic ectasia: Secondary | ICD-10-CM

## 2018-09-19 DIAGNOSIS — Z953 Presence of xenogenic heart valve: Secondary | ICD-10-CM

## 2018-09-19 DIAGNOSIS — I712 Thoracic aortic aneurysm, without rupture, unspecified: Secondary | ICD-10-CM

## 2018-09-24 ENCOUNTER — Ambulatory Visit: Payer: Medicare Other | Admitting: Thoracic Surgery (Cardiothoracic Vascular Surgery)

## 2018-09-26 ENCOUNTER — Other Ambulatory Visit: Payer: Self-pay | Admitting: Adult Health

## 2018-09-26 NOTE — Telephone Encounter (Signed)
Should only take twice a day

## 2018-09-26 NOTE — Telephone Encounter (Signed)
Pt reported taking twice daily.  Directions say TID.  Please advise.

## 2018-09-27 NOTE — Telephone Encounter (Signed)
Sent to the pharmacy by e-scribe. 

## 2018-10-10 ENCOUNTER — Other Ambulatory Visit: Payer: Self-pay | Admitting: Adult Health

## 2018-10-10 DIAGNOSIS — E119 Type 2 diabetes mellitus without complications: Secondary | ICD-10-CM

## 2018-10-10 NOTE — Telephone Encounter (Signed)
No A1C since 03/2018.  Please advise.

## 2018-10-10 NOTE — Telephone Encounter (Signed)
Sent to the pharmacy by e-scribe. 

## 2018-10-10 NOTE — Telephone Encounter (Signed)
He has history of lung CA and significant COPD. A1c has been stable. I am ok with extending him 90 days.

## 2018-10-16 ENCOUNTER — Ambulatory Visit (HOSPITAL_COMMUNITY): Payer: Medicare Other

## 2018-10-19 ENCOUNTER — Other Ambulatory Visit: Payer: Self-pay | Admitting: Pulmonary Disease

## 2018-10-29 ENCOUNTER — Ambulatory Visit: Payer: Medicare Other | Admitting: Thoracic Surgery (Cardiothoracic Vascular Surgery)

## 2018-10-30 ENCOUNTER — Ambulatory Visit (HOSPITAL_COMMUNITY): Payer: Medicare Other

## 2018-11-05 ENCOUNTER — Ambulatory Visit: Payer: Medicare Other | Admitting: Thoracic Surgery (Cardiothoracic Vascular Surgery)

## 2018-11-12 ENCOUNTER — Telehealth: Payer: Self-pay | Admitting: Cardiology

## 2018-11-12 NOTE — Telephone Encounter (Signed)
New message     Phone visit scheduled for 11-19-18.  Per pt, he does not have a landline phone.  Pt gave consent for phone visit.  He does not hear well and wife may also be on the line during visit.  YOUR CARDIOLOGY TEAM HAS ARRANGED FOR AN E-VISIT FOR YOUR APPOINTMENT - PLEASE REVIEW IMPORTANT INFORMATION BELOW SEVERAL DAYS PRIOR TO YOUR APPOINTMENT  Due to the recent COVID-19 pandemic, we are transitioning in-person office visits to tele-medicine visits in an effort to decrease unnecessary exposure to our patients, their families, and staff. These visits are billed to your insurance just like a normal visit is. We also encourage you to sign up for MyChart if you have not already done so. You will need a smartphone if possible. For patients that do not have this, we can still complete the visit using a regular telephone but do prefer a smartphone to enable video when possible. You may have a family member that lives with you that can help. If possible, we also ask that you have a blood pressure cuff and scale at home to measure your blood pressure, heart rate and weight prior to your scheduled appointment. Patients with clinical needs that need an in-person evaluation and testing will still be able to come to the office if absolutely necessary. If you have any questions, feel free to call our office.     YOUR PROVIDER WILL BE USING THE FOLLOWING PLATFORM TO COMPLETE YOUR VISIT: Phone   IF USING MYCHART - How to Download the MyChart App to Your SmartPhone   - If Apple, go to [employer] and type in MyChart in the search bar and download the app. If Android, ask patient to go to [employer] and type in [place] in the search bar and download the app. The app is free but as with any other app downloads, your phone may require you to verify saved payment information or Apple/Android password.  - You will need to then log into the app with your MyChart username and password, and select [facility]  as your healthcare provider to link the account.  - When it is time for your visit, go to the MyChart app, find appointments, and click Begin Video Visit. Be sure to Select Allow for your device to access the Microphone and Camera for your visit. You will then be connected, and your provider will be with you shortly.  **If you have any issues connecting or need assistance, please contact MyChart service desk [phone])**  **If using a computer, in order to ensure the best quality for your visit, you will need to use either of the following Internet Browsers: Insurance underwriter or [employer] Edge**   IF USING DOXIMITY or [email or web address] - The staff will give you instructions on receiving your link to join the meeting the day of your visit.      2-3 DAYS BEFORE YOUR APPOINTMENT  You will receive a telephone call from one of our [facility] team members - your caller ID may say "Unknown caller." If this is a video visit, we will walk you through how to get the video launched on your phone. We will remind you check your blood pressure, heart rate and weight prior to your scheduled appointment. If you have an Apple Watch or Kardia, please upload any pertinent ECG strips the day before or morning of your appointment to [facility]. Our staff will also make sure you have reviewed the consent and agree to  move forward with your scheduled tele-health visit.     THE DAY OF YOUR APPOINTMENT  Approximately 15 minutes prior to your scheduled appointment, you will receive a telephone call from one of [facility] team - your caller ID may say "Unknown caller."  Our staff will confirm medications, vital signs for the day and any symptoms you may be experiencing. Please have this information available prior to the time of visit start. It may also be helpful for you to have a pad of paper and pen handy for any instructions given during your visit. They will also walk you through joining the smartphone meeting if this  is a video visit.    CONSENT FOR TELE-HEALTH VISIT - PLEASE REVIEW  I hereby voluntarily request, consent and authorize [facility] and its employed or contracted physicians, physician assistants, nurse practitioners or other licensed health care professionals (the Practitioner), to provide me with telemedicine health care services (the Services") as deemed necessary by the treating Practitioner. I acknowledge and consent to receive the Services by the Practitioner via telemedicine. I understand that the telemedicine visit will involve communicating with the Practitioner through live audiovisual communication technology and the disclosure of certain medical information by electronic transmission. I acknowledge that I have been given the opportunity to request an in-person assessment or other available alternative prior to the telemedicine visit and am voluntarily participating in the telemedicine visit.  I understand that I have the right to withhold or withdraw my consent to the use of telemedicine in the course of my care at any time, without affecting my right to future care or treatment, and that the Practitioner or I may terminate the telemedicine visit at any time. I understand that I have the right to inspect all information obtained and/or recorded in the course of the telemedicine visit and may receive copies of available information for a reasonable fee.  I understand that some of the potential risks of receiving the Services via telemedicine include:   Delay or interruption in medical evaluation due to technological equipment failure or disruption;  Information transmitted may not be sufficient (e.g. poor resolution of images) to allow for appropriate medical decision making by the Practitioner; and/or   In rare instances, security protocols could fail, causing a breach of personal health information.  Furthermore, I acknowledge that it is my responsibility to provide information about  my medical history, conditions and care that is complete and accurate to the best of my ability. I acknowledge that Practitioner's advice, recommendations, and/or decision may be based on factors not within their control, such as incomplete or inaccurate data provided by me or distortions of diagnostic images or specimens that may result from electronic transmissions. I understand that the practice of medicine is not an exact science and that Practitioner makes no warranties or guarantees regarding treatment outcomes. I acknowledge that I will receive a copy of this consent concurrently upon execution via email to the email address I last provided but may also request a printed copy by calling the office of [facility].    I understand that my insurance will be billed for this visit.   I have read or had this consent read to me.  I understand the contents of this consent, which adequately explains the benefits and risks of the Services being provided via telemedicine.   I have been provided ample opportunity to ask questions regarding this consent and the Services and have had my questions answered to my satisfaction.  I give  my informed consent for the services to be provided through the use of telemedicine in my medical care  By participating in this telemedicine visit I agree to the above.

## 2018-11-16 NOTE — Progress Notes (Signed)
Virtual Visit via Telephone Note   This visit type was conducted due to national recommendations for restrictions regarding the COVID-19 Pandemic (e.g. social distancing) in an effort to limit this patient's exposure and mitigate transmission in our community.  Due to his co-morbid illnesses, this patient is at least at moderate risk for complications without adequate follow up.  This format is felt to be most appropriate for this patient at this time.  The patient did not have access to video technology/had technical difficulties with video requiring transitioning to audio format only (telephone).  All issues noted in this document were discussed and addressed.  No physical exam could be performed with this format.  Please refer to the patient's chart for his  consent to telehealth for [facility].   Date:  11/16/2018   ID:  [name], DOB Oct 31, 1945, MRN [id number]  Patient Location: Home Provider Location: Office  PCP:  [name], NP  Cardiologist:  Candee Furbish, MD  Electrophysiologist:  Cristopher Peru, MD   Evaluation Performed:  Follow up  Chief Complaint:  HF   History of Present Illness:    [name] is a 73 y.o. male with he has a hx of mitral regurgitation due toendocarditis s/p bioprosthetic MVR, permanent AF, intracerebral hemorrhage in 2009 (anticoagulation stopped), systolic CHF, NICM with EF 35%, inferior MI in 2009, prior CVA, COPD on chronic O2, Lung CA s/p wedge resection - followed by Dr. Earlie Server with oncology, pulmonary HTN (Dr. Elsworth Soho). He underwent AV node ablation due uncontrolled atrial fibrillation in 2010 with placement of BiV-ICD. He also underwent LA appendage clipping at the time of his MV surgery. LHC prior to MVR demonstrated normal coronaries (etiology of MI in 2009 unknown). not on anticoagulation due prior CNS bleed and prior Lt atrial appendage occlusion/tied- no chronic anticoagulation.   Admitted in 3/17 for near syncope related to  orthostatic hypotension. 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 He has declined surgical intervention.   Saw Dr. Lovena Le about 2 weeks ago for his device check/visit - was felt to be doing well. Has about 2.5 years of battery life. Last seen by Dr. Marlou Porch in March and was felt to be stable at that time as well.   On last visit was stable. Having his physical later this month with PCP. He has no real concerns.   ER visit 2/20 for dizziness and H/A.  CTA of head and neck with no acute abnormality.      I/2020 hospitalized with PNA and COPD exacerbation.  Echo was done at that time.  Per echocardiogram "Bioprosthetic mitral valve mean gradient has increased from 78mmHg in 08/2016 to 10 mmHg. Based on the DVI and PHT, findings aremost consistent with patient prosthesis mismatch  He is to have cardiac CTA  For aneurysm of aortic root that is followed by Dr. Roxy Manns and on last echo was 47 mm, he has refused surgery in the past.  Last remote check 08/2018 was stable Today no complaints, no chest pain, no rapid HR, no palpitations no fevers.  Continues on home oxygen.  No blood in stool or urine. No syncope.  With Coronavirus he is staying in the house or yard.     The patient does not have symptoms concerning for COVID-19 infection (fever, chills, cough, or new shortness of breath).    Past Medical History:  Diagnosis Date  . Atrial fibrillation ([place])    AV Node ablation January, 2010, for rapid atrial fib  . Atrial septal defect    Closed with surgery January, 2010  . Automatic implantable cardioverter-defibrillator in situ    LV dysfunction and pacer needed for AV node lesion  . Cardiomyopathy    non-ischemic  . CHF (congestive heart failure) ([place])   . Chronic combined systolic and diastolic CHF (congestive heart failure) ([place])   . Colon polyps   . COPD (chronic  obstructive pulmonary disease) (HCC)    O2- 2 liters, nasal cannula, q night   . COPD GOLD II 01/11/2007   PFT's 09/25/13  FEV1  2.18 (61%) ratio 64 no change p B2 and DLC0  32% corrects to 68% - trial off advair and acei rec starting  08/23/2013     . CVA (cerebral vascular accident) ([place]) 2009   denies residual on 08/14/2013  . Diabetes mellitus without complication ([place])    type 2  . Dyslipidemia   . Dysrhythmia   . Ejection fraction < 50%   . Endocarditis    Bacterial, 2009  . Headache(784.0)    related to stroke only  . HLD (hyperlipidemia)   . Hypertension   . Intracranial hemorrhage (HCC)    Coumadin cannot be used because of the history of his bleed  . Lung cancer ([place]) 11/29/2013   T1N0 Stage Ia non-small cell carcinoma left lung treated with wedge resection  . Mitral regurgitation    Severe symptomatic primary MR due to bacterial endocarditis, treated w/ MVR // Echo 1/18 EF 40-45, diffuse HK, dilated aorta at 42 mm/aortic root 47 mm; linear echogenic structure in ascending aorta - suspect reverberation artifact / consider CT or TEE to rule out dissection flap, bioprosthetic MVR with mean gradient 3, severe LAE, low normal RVSF, severe RAE, moderate TR, PASP 42  . Myocardial infarction ([place]) 2010  . Pacemaker    combo pacer and icd  . Permanent atrial fibrillation    Originally Coumadin use for atrial fibrillation  //   he had intracerebral hemorrhage with an INR of 2.3 June, 2009. Anticoagulation could no longer be used.  //  Rapid atrial fibrillation after inferior MI October, 2010..........Marland Kitchen AV node ablation done at that time with ICD pacemaker placed (EF 35%).   //   Left atrial appendage tied off at the time of mitral valve surgery January, 2010 (maze pro  . Pneumonia 07/2018  . Prosthetic valve dysfunction    Mild mitral stenosis  . Pulmonary hypertension (HCC)    Moderate  . Renal artery stenosis (HCC)    Mild by history  . 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  Spontaneous pneumothorax    right thoracotomy - distant past  . Status post minimally invasive mitral valve replacement with bioprosthetic valve    33 mm Medtronic Mosaic porcine bioprosthesis placed via right mini thoracotomy for bacterial endocarditis complicated by severe MR and CHF   . Thoracic aortic aneurysm ([place]) 08/11/2016   a - Chest CTA 1/18:  Aneurysmal dilatation of aortic root is noted at 5.1 cm.    Past Surgical History:  Procedure Laterality Date  . APPENDECTOMY    . ASD REPAIR, SECUNDUM  07/17/2008   pericardial patch closure of ASD  . AV NODE ABLATION  07/2008   for rapid atrial fib  . CARDIAC CATHETERIZATION    . CARDIAC DEFIBRILLATOR PLACEMENT  ~ [address] [place]  . CARDIAC VALVE REPLACEMENT    .  CATARACT EXTRACTION W/ INTRAOCULAR LENS  IMPLANT, BILATERAL Bilateral   . ESOPHAGOGASTRODUODENOSCOPY (EGD) WITH PROPOFOL N/A 09/29/2016   Procedure: ESOPHAGOGASTRODUODENOSCOPY (EGD) WITH PROPOFOL;  Surgeon: [name], MD;  Location: [facility] ENDOSCOPY;  Service: Endoscopy;  Laterality: N/A;  . HERNIA REPAIR    . IMPLANTABLE CARDIOVERTER DEFIBRILLATOR (ICD) GENERATOR CHANGE N/A 02/06/2012   Procedure: ICD GENERATOR CHANGE;  Surgeon: [name], MD;  Location: [facility] C[facility] LAB;  Service: Cardiovascular;  Laterality: N/A;  . INSERT / REPLACE / REMOVE PACEMAKER    . MASS BIOPSY Left    neck mass  . MITRAL VALVE REPLACEMENT Right 07/17/2008   33mm Medtronic Mosaic porcine bioprosthesis  . PENILE PROSTHESIS IMPLANT    . RIGHT HEART CATHETERIZATION N/A 08/16/2013   Procedure: RIGHT HEART CATH;  Surgeon: Josue Hector, MD;  Location: [facility] CATH LAB;  Service: Cardiovascular;  Laterality: N/A;  . TEE WITHOUT CARDIOVERSION N/A 09/06/2016   Procedure: TRANSESOPHAGEAL ECHOCARDIOGRAM (TEE);  Surgeon: Dorothy Spark, MD;  Location: [facility] ENDOSCOPY;  Service: Cardiovascular;  Laterality: N/A;  . THORACOTOMY Right 1970's   spontaneous pneumothorax - while in the [place]  . TOE SURGERY     left foot  hammer toe  . TONSILLECTOMY    . VIDEO ASSISTED THORACOSCOPY (VATS)/WEDGE RESECTION Left 11/29/2013   Procedure: Video assisted thoracoscopy for wedge resection; mini thoracotomy;  Surgeon: [name], MD;  Location: [facility];  Service: Thoracic;  Laterality: Left;     No outpatient medications have been marked as taking for the 11/19/18 encounter (Appointment) with Isaiah Serge, NP.     Allergies:   Anticoagulant compound; Other; and Warfarin sodium   Social History   Tobacco Use  . Smoking status: Former Smoker    Packs/day: 1.00    Years: 45.00    Pack years: 45.00    Types: Cigarettes    Last attempt to quit: 08/11/2013    Years since quitting: 5.2  . Smokeless tobacco: Never Used  . Tobacco comment: 08/14/2013 "quit smoking in 2009"  Substance Use Topics  . Alcohol use: No    Alcohol/week: 0.0 standard drinks    Comment: 08/14/2013 "used to drink beer; quit:in 1982"  . Drug use: No     Family Hx: The patient's family history includes Stomach cancer in his father; Stroke in his mother.  ROS:   Please see the history of present illness.    General:no colds or fevers, no weight changes Skin:no rashes or ulcers HEENT:no blurred vision, no congestion CV:see HPI PUL:see HPI GI:no diarrhea constipation or melena, no indigestion GU:no hematuria, no dysuria MS:no joint pain, no claudication Neuro:no syncope, no lightheadedness Endo:+ diabetes stable per pt., no thyroid disease  All other systems reviewed and are negative.   Prior CV studies:   The following studies were reviewed today:  Echo 08/01/18 Study Conclusions  - Left ventricle: The cavity size was normal. There was mild   concentric hypertrophy. Systolic function was moderately reduced.   The estimated ejection fraction was in the range of 35% to 40%.   Diffuse hypokinesis. Akinesis of the apical myocardium. - Aortic valve: Transvalvular velocity was within the normal range.   There was no stenosis. There  was mild regurgitation. - Aorta: There was dilation of the sinus(es) of Valsalva. Aortic   root dimension: 47 mm (ED). Ascending aortic diameter: 42.1 mm   (S). - Mitral valve: A 40mm Medtronic Mosaic bioprosthesis was present.   Transvalvular velocity was within the normal range. There was  no   evidence for stenosis. There was trivial regurgitation. Pressure   half-time: 170 ms. Mean gradient (D): 10 mm Hg. Valve area by   pressure half-time: 1.29 cm^2. Valve area by continuity equation   (using LVOT flow): 1.33 cm^2. - Left atrium: The atrium was severely dilated. - Right ventricle: The cavity size was normal. Wall thickness was   normal. Systolic function was moderately reduced. - Right atrium: The atrium was severely dilated. - Tricuspid valve: There was trivial regurgitation. - Pulmonary arteries: Systolic pressure was mildly to moderately   increased. PA peak pressure: 49 mm Hg (S).  Impressions:  - Bioprosthetic mitral valve mean gradient has increased from 7   mmHg in 08/2016 to 10 mmHg. Based on the DVI and PHT, findings are   most consistent with patient prosthesis mismatch. There is an   aortic root aneurysm that is unchanged from 07/2016.  TEE 09/06/16 Study Conclusions  - Left ventricle: Systolic function was mildly reduced. The   estimated ejection fraction was in the range of 45% to 50%. Wall   motion was normal; there were no regional wall motion   abnormalities. - Mitral valve: S/P MVR with bioprosthetic valve with 33 mm   Medtronic Mosaic porcine bioprosthesis. There is no central   mitral regurgitation or paravalvular leak. There is limited   motion of the posterior leaflet. Mean transmitral gradient is 7   mmHg.   Valve area by pressure half-time: 1.26 cm^2. - Left atrium: The atrium was massively dilated. No evidence of   thrombus in the atrial cavity or appendage but heavy smoke seen   in the left atrium. The appendage was multilobulated and large.    Emptying velocity was decreased. - Right atrium: The atrium was dilated. No evidence of thrombus in   the atrial cavity or appendage.  Impressions:  - Aortic valve leaflets are structurally normal, with normal   coaptation and no regurgitation.   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 The pseudoaneurysm measures 32 x 25   mm. The communication is seen on both 2D (image 21, 22) and 3D   images (image 26).     S/P MVR with bioprosthetic valve with 33 mm Medtronic Mosaic   porcine bioprosthesis. There is no central mitral regurgitation   or paravalvular leak. There is limited motion of the posterior   leaflet however anterior leaflet opens very well. Mean   transmitral gradient is 7 mmHg that is most probably attributed   to prosthesis/patient mismatch. There is no significant stenosis.   Labs/Other Tests and Data Reviewed:    EKG:  An ECG dated 08/28/18 was personally reviewed today and demonstrated:  a flutter with RBBB   Recent Labs: 04/06/2018: TSH 2.11 07/30/2018: B Natriuretic Peptide 437.2 08/01/2018: Magnesium 2.1 08/27/2018: ALT 10; BUN 18; Creatinine, Ser 1.04; Hemoglobin 10.6; Platelets 216; Potassium 4.7; Sodium 139   Recent Lipid Panel Lab Results  Component Value Date/Time   CHOL 104 04/06/2018 09:42 AM   TRIG 115.0 04/06/2018 09:42 AM   HDL 48.20 04/06/2018 09:42 AM   CHOLHDL 2 04/06/2018 09:42 AM   LDLCALC 33 04/06/2018 09:42 AM   LDLDIRECT 80.2 03/31/2011 02:11 PM    Wt Readings from Last 3 Encounters:  08/17/18 164 lb (74.4 kg)  07/30/18 171 lb (77.6 kg)  07/26/18 173 lb (78.5 kg)     Objective:    Vital Signs:  There were no vitals taken for this  visit.  Pt could not take BP In ER 08/27/18 BP was 125/65  VITAL SIGNS:  reviewed  General NAD mild SOB Neuro A&O X 3  Follows commands, answers questions approp Lungs mild SOB with talking. No audible wheezes Psych: pleasant affect  ASSESSMENT &  PLAN:    1. Permanent a fib not a candidate for anticoagulation due prior CNS bleed and prior Lt atrial appendage occlusion/tied- rate controlled 2. CM/Cronic systolic HF no edema per pt today. No increase of chronic SOB 3. COPD on home 02, stable, exacerbation and PNA in Jan 20. Now back to baseline pe rpt 4. Prior MVR and increased gradient to 10 from 7 will defer to Dr. Marlou Porch.  And pt will see Dr. Marlou Porch back in 3 months. 5. Aortic root aneurysm. Followed by Dr. Roxy Manns.   6. Hx of lung cancer followed by Dr. Julien Nordmann. 7. Hx of AV nodal ablation with BiV/ICD in place, for device check 11/28/18, last one in Feb was stable. 8. Hx of intracranial hemorrhage, recent dizziness and headache seen in ER and CT of head stable.   COVID-19 Education: The signs and symptoms of COVID-19 were discussed with the patient and how to seek care for testing (follow up with PCP or arrange E-visit).  The importance of social distancing was discussed today.  Time:   Today, I have spent 10 minutes with the patient with telehealth technology discussing the above problems.     Medication Adjustments/Labs and Tests Ordered: Current medicines are reviewed at length with the patient today.  Concerns regarding medicines are outlined above.   Tests Ordered: No orders of the defined types were placed in this encounter.   Medication Changes: No orders of the defined types were placed in this encounter.   Disposition:  Follow up in 3 month(s)  Signed, Cecilie Kicks, NP  11/16/2018 5:06 PM    [facility]

## 2018-11-19 ENCOUNTER — Telehealth (INDEPENDENT_AMBULATORY_CARE_PROVIDER_SITE_OTHER): Payer: Medicare Other | Admitting: Cardiology

## 2018-11-19 ENCOUNTER — Other Ambulatory Visit: Payer: Self-pay

## 2018-11-19 ENCOUNTER — Encounter: Payer: Self-pay | Admitting: Cardiology

## 2018-11-19 VITALS — Ht 73.0 in | Wt 165.0 lb

## 2018-11-19 DIAGNOSIS — J449 Chronic obstructive pulmonary disease, unspecified: Secondary | ICD-10-CM

## 2018-11-19 DIAGNOSIS — I428 Other cardiomyopathies: Secondary | ICD-10-CM | POA: Diagnosis not present

## 2018-11-19 DIAGNOSIS — I7121 Aneurysm of the ascending aorta, without rupture: Secondary | ICD-10-CM

## 2018-11-19 DIAGNOSIS — Z95810 Presence of automatic (implantable) cardiac defibrillator: Secondary | ICD-10-CM

## 2018-11-19 DIAGNOSIS — I5022 Chronic systolic (congestive) heart failure: Secondary | ICD-10-CM

## 2018-11-19 DIAGNOSIS — I4821 Permanent atrial fibrillation: Secondary | ICD-10-CM

## 2018-11-19 DIAGNOSIS — I719 Aortic aneurysm of unspecified site, without rupture: Secondary | ICD-10-CM

## 2018-11-19 DIAGNOSIS — Z953 Presence of xenogenic heart valve: Secondary | ICD-10-CM

## 2018-11-19 NOTE — Patient Instructions (Addendum)
Medication Instructions:  Your physician recommends that you continue on your current medications as directed. Please refer to the Current Medication list given to you today.  If you need a refill on your cardiac medications before your next appointment, please call your pharmacy.   Lab work: None ordered  If you have labs (blood work) drawn today and your tests are completely normal, you will receive your results only by: Marland Kitchen MyChart Message (if you have MyChart) OR . A paper copy in the mail If you have any lab test that is abnormal or we need to change your treatment, we will call you to review the results.  Testing/Procedures: None ordered   Follow-Up: At [facility], you and your health needs are our priority.  As part of our continuing mission to provide you with exceptional heart care, we have created designated Provider Care Teams.  These Care Teams include your primary Cardiologist (physician) and Advanced Practice Providers (APPs -  Physician Assistants and Nurse Practitioners) who all work together to provide you with the care you need, when you need it. You will need a follow up appointment in 3 months.  YOU HAVE BEEN SCHEDULED FOR AN IN OFFICE VISIT [id number] AT 10:20 WITH DR. Marlou Porch

## 2018-11-28 ENCOUNTER — Other Ambulatory Visit: Payer: Self-pay

## 2018-11-28 ENCOUNTER — Ambulatory Visit (INDEPENDENT_AMBULATORY_CARE_PROVIDER_SITE_OTHER): Payer: Medicare Other | Admitting: *Deleted

## 2018-11-28 DIAGNOSIS — I428 Other cardiomyopathies: Secondary | ICD-10-CM | POA: Diagnosis not present

## 2018-11-28 LAB — CUP PACEART REMOTE DEVICE CHECK
Date Time Interrogation Session: 20200520172608
Implantable Lead Implant Date: 20100113
Implantable Lead Implant Date: 20100113
Implantable Lead Location: 753858
Implantable Lead Location: 753860
Implantable Lead Model: 7122
Implantable Pulse Generator Implant Date: 20130729
Pulse Gen Serial Number: 7053988

## 2018-12-07 ENCOUNTER — Encounter: Payer: Self-pay | Admitting: Cardiology

## 2018-12-07 NOTE — Progress Notes (Signed)
Remote ICD transmission.   

## 2018-12-21 ENCOUNTER — Ambulatory Visit (HOSPITAL_COMMUNITY)
Admission: RE | Admit: 2018-12-21 | Discharge: 2018-12-21 | Disposition: A | Discharge status: Home / Self Care | Payer: Medicare Other | Source: Ambulatory Visit | Attending: Internal Medicine | Admitting: Internal Medicine

## 2018-12-21 ENCOUNTER — Other Ambulatory Visit: Payer: Self-pay

## 2018-12-21 ENCOUNTER — Inpatient Hospital Stay: Payer: Medicare Other | Attending: Internal Medicine

## 2018-12-21 DIAGNOSIS — Z85118 Personal history of other malignant neoplasm of bronchus and lung: Secondary | ICD-10-CM | POA: Diagnosis not present

## 2018-12-21 DIAGNOSIS — C3490 Malignant neoplasm of unspecified part of unspecified bronchus or lung: Secondary | ICD-10-CM | POA: Insufficient documentation

## 2018-12-21 DIAGNOSIS — J439 Emphysema, unspecified: Secondary | ICD-10-CM | POA: Diagnosis not present

## 2018-12-21 DIAGNOSIS — I700 Atherosclerosis of aorta: Secondary | ICD-10-CM | POA: Diagnosis not present

## 2018-12-21 DIAGNOSIS — R590 Localized enlarged lymph nodes: Secondary | ICD-10-CM | POA: Insufficient documentation

## 2018-12-21 LAB — CBC WITH DIFFERENTIAL (CANCER CENTER ONLY)
Abs Immature Granulocytes: 0.04 10*3/uL (ref 0.00–0.07)
Basophils Absolute: 0.1 10*3/uL (ref 0.0–0.1)
Basophils Relative: 1 %
Eosinophils Absolute: 0.2 10*3/uL (ref 0.0–0.5)
Eosinophils Relative: 2 %
HCT: 37.8 % — ABNORMAL LOW (ref 39.0–52.0)
Hemoglobin: 11 g/dL — ABNORMAL LOW (ref 13.0–17.0)
Immature Granulocytes: 0 %
Lymphocytes Relative: 9 %
Lymphs Abs: 0.8 10*3/uL (ref 0.7–4.0)
MCH: 25.2 pg — ABNORMAL LOW (ref 26.0–34.0)
MCHC: 29.1 g/dL — ABNORMAL LOW (ref 30.0–36.0)
MCV: 86.7 fL (ref 80.0–100.0)
Monocytes Absolute: 0.9 10*3/uL (ref 0.1–1.0)
Monocytes Relative: 10 %
Neutro Abs: 7.3 10*3/uL (ref 1.7–7.7)
Neutrophils Relative %: 78 %
Platelet Count: 240 10*3/uL (ref 150–400)
RBC: 4.36 MIL/uL (ref 4.22–5.81)
RDW: 17.4 % — ABNORMAL HIGH (ref 11.5–15.5)
WBC Count: 9.2 10*3/uL (ref 4.0–10.5)
nRBC: 0 % (ref 0.0–0.2)

## 2018-12-21 LAB — CMP (CANCER CENTER ONLY)
ALT: 9 U/L (ref 0–44)
AST: 11 U/L — ABNORMAL LOW (ref 15–41)
Albumin: 4.2 g/dL (ref 3.5–5.0)
Alkaline Phosphatase: 59 U/L (ref 38–126)
Anion gap: 16 — ABNORMAL HIGH (ref 5–15)
BUN: 27 mg/dL — ABNORMAL HIGH (ref 8–23)
CO2: 23 mmol/L (ref 22–32)
Calcium: 9.5 mg/dL (ref 8.9–10.3)
Chloride: 102 mmol/L (ref 98–111)
Creatinine: 1.16 mg/dL (ref 0.61–1.24)
GFR, Est AFR Am: >60 mL/min (ref >60)
GFR, Estimated: >60 mL/min (ref >60)
Glucose, Bld: 122 mg/dL — ABNORMAL HIGH (ref 70–99)
Potassium: 4.6 mmol/L (ref 3.5–5.1)
Sodium: 141 mmol/L (ref 135–145)
Total Bilirubin: 1.6 mg/dL — ABNORMAL HIGH (ref 0.3–1.2)
Total Protein: 7.3 g/dL (ref 6.5–8.1)

## 2018-12-21 MED ORDER — IOHEXOL 300 MG/ML  SOLN
75.0000 mL | Freq: Once | INTRAMUSCULAR | Status: AC | PRN
  Administered 2018-12-21: 75 mL via INTRAVENOUS

## 2018-12-21 MED ORDER — SODIUM CHLORIDE (PF) 0.9 % IJ SOLN
INTRAMUSCULAR | Status: AC
  Filled 2018-12-21: qty 50

## 2018-12-24 ENCOUNTER — Inpatient Hospital Stay (HOSPITAL_BASED_OUTPATIENT_CLINIC_OR_DEPARTMENT_OTHER): Payer: Medicare Other | Admitting: Internal Medicine

## 2018-12-24 ENCOUNTER — Other Ambulatory Visit: Payer: Self-pay

## 2018-12-24 ENCOUNTER — Telehealth (HOSPITAL_COMMUNITY): Payer: Self-pay | Admitting: Emergency Medicine

## 2018-12-24 ENCOUNTER — Encounter: Payer: Self-pay | Admitting: Internal Medicine

## 2018-12-24 VITALS — BP 113/54 | HR 72 | Temp 98.0°F | Resp 18 | Ht 73.0 in | Wt 163.0 lb

## 2018-12-24 DIAGNOSIS — Z85118 Personal history of other malignant neoplasm of bronchus and lung: Secondary | ICD-10-CM | POA: Diagnosis not present

## 2018-12-24 DIAGNOSIS — R590 Localized enlarged lymph nodes: Secondary | ICD-10-CM | POA: Diagnosis not present

## 2018-12-24 DIAGNOSIS — C3490 Malignant neoplasm of unspecified part of unspecified bronchus or lung: Secondary | ICD-10-CM

## 2018-12-24 DIAGNOSIS — C3412 Malignant neoplasm of upper lobe, left bronchus or lung: Secondary | ICD-10-CM

## 2018-12-24 DIAGNOSIS — I5042 Chronic combined systolic (congestive) and diastolic (congestive) heart failure: Secondary | ICD-10-CM

## 2018-12-24 NOTE — Telephone Encounter (Signed)
Unable to leave message

## 2018-12-24 NOTE — Progress Notes (Signed)
[place] [phone]   [phone]  OFFICE PROGRESS NOTE  [name], NP [place] [address]  DIAGNOSIS: Stage IA (T1a, N0, M0) non-small cell lung cancer, well-differentiated adenocarcinoma of the left upper lobe diagnosed in February of 2015   PRIOR THERAPY: Status post wedge resection of the left upper lobe under the care of Dr. Roxy Manns on 11/29/2013.  CURRENT THERAPY: Observation.  INTERVAL HISTORY: [name] 73 y.o. male returns to the clinic today for follow-up visit.  The patient is feeling fine today with no concerning complaints except for the baseline shortness of breath and he is currently on home oxygen.  He denied having any chest pain, cough or hemoptysis.  He denied having any fever or chills.  He has no nausea, vomiting, diarrhea or constipation.  He had repeat CT scan of the chest performed recently and is here for evaluation and discussion of his scan results.  MEDICAL HISTORY: Past Medical History:  Diagnosis Date  . Atrial fibrillation ([place])    AV Node ablation January, 2010, for rapid atrial fib  . Atrial septal defect    Closed with surgery January, 2010  . Automatic implantable cardioverter-defibrillator in situ    LV dysfunction and pacer needed for AV node lesion  . Cardiomyopathy    non-ischemic  . CHF (congestive heart failure) ([place])   . Chronic combined systolic and diastolic CHF (congestive heart failure) ([place])   . Colon polyps   . COPD (chronic obstructive pulmonary disease) (HCC)    O2- 2 liters, nasal cannula, q night   . COPD GOLD II 01/11/2007   PFT's 09/25/13  FEV1  2.18 (61%) ratio 64 no change p B2 and DLC0  32% corrects to 68% - trial off advair and acei rec starting  08/23/2013     . CVA (cerebral vascular accident) ([place]) 2009   denies residual on 08/14/2013  . Diabetes mellitus without complication ([place])    type 2  . Dyslipidemia   . Dysrhythmia   . Ejection fraction <  50%   . Endocarditis    Bacterial, 2009  . Headache(784.0)    related to stroke only  . HLD (hyperlipidemia)   . Hypertension   . Intracranial hemorrhage (HCC)    Coumadin cannot be used because of the history of his bleed  . Lung cancer ([place]) 11/29/2013   T1N0 Stage Ia non-small cell carcinoma left lung treated with wedge resection  . Mitral regurgitation    Severe symptomatic primary MR due to bacterial endocarditis, treated w/ MVR // Echo 1/18 EF 40-45, diffuse HK, dilated aorta at 42 mm/aortic root 47 mm; linear echogenic structure in ascending aorta - suspect reverberation artifact / consider CT or TEE to rule out dissection flap, bioprosthetic MVR with mean gradient 3, severe LAE, low normal RVSF, severe RAE, moderate TR, PASP 42  . Myocardial infarction ([place]) 2010  . Pacemaker    combo pacer and icd  . Permanent atrial fibrillation    Originally Coumadin use for atrial fibrillation  //   he had intracerebral hemorrhage with an INR of 2.3 June, 2009. Anticoagulation could no longer be used.  //  Rapid atrial fibrillation after inferior MI October, 2010..........Marland Kitchen AV node ablation done at that time with ICD pacemaker placed (EF 35%).   //   Left atrial appendage tied off at the time of mitral valve surgery January, 2010 (maze pro  . Pneumonia 07/2018  . Prosthetic  valve dysfunction    Mild mitral stenosis  . Pulmonary hypertension (HCC)    Moderate  . Renal artery stenosis (HCC)    Mild by history  . 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 Spontaneous pneumothorax    right thoracotomy - distant past  . Status post minimally invasive mitral valve replacement with bioprosthetic valve    33 mm Medtronic Mosaic porcine bioprosthesis placed via right mini thoracotomy for bacterial endocarditis complicated by severe MR and CHF   . Thoracic aortic aneurysm ([place]) 08/11/2016   a - Chest CTA 1/18:  Aneurysmal dilatation of aortic root is noted at 5.1 cm.     ALLERGIES:  is allergic to  anticoagulant compound; other; and warfarin sodium.  MEDICATIONS:  Current Outpatient Medications  Medication Sig Dispense Refill  . amoxicillin (AMOXIL) 500 MG capsule Take 2,000 mg by mouth See admin instructions. Take 2,000 mg by mouth one hour prior to dental procedures    . aspirin EC 81 MG tablet Take 1 tablet (81 mg total) by mouth daily. 90 tablet 3  . atorvastatin (LIPITOR) 10 MG tablet TAKE 1 TABLET BY MOUTH EVERY DAY 90 tablet 3  . budesonide-formoterol (SYMBICORT) 160-4.5 MCG/ACT inhaler INHALE 2 PUFFS INTO THE LUNGS TWICE A DAY 10.2 Inhaler 6  . carvedilol (COREG) 3.125 MG tablet TAKE 1 TABLET BY MOUTH TWICE A DAY WITH MEALS 180 tablet 1  . cetirizine (ZYRTEC) 10 MG tablet Take 10 mg by mouth daily.    . furosemide (LASIX) 40 MG tablet TAKE 1 TABLET BY MOUTH EVERY DAY 90 tablet 3  . metFORMIN (GLUCOPHAGE) 1000 MG tablet TAKE 1 TABLET BY MOUTH TWICE A DAY WITH MEALS 180 tablet 0  . nitroGLYCERIN (NITROSTAT) 0.4 MG SL tablet Place 0.4 mg under the tongue every 5 (five) minutes x 3 doses as needed for chest pain.     Marland Kitchen Olopatadine HCl (PAZEO) 0.7 % SOLN Apply 1 drop to eye daily. 1 Bottle 0  . OXYGEN Inhale 2 L into the lungs continuous.     . valsartan (DIOVAN) 40 MG tablet TAKE 1 TABLET BY MOUTH EVERY DAY 90 tablet 2   No current facility-administered medications for this visit.     SURGICAL HISTORY:  Past Surgical History:  Procedure Laterality Date  . APPENDECTOMY    . ASD REPAIR, SECUNDUM  07/17/2008   pericardial patch closure of ASD  . AV NODE ABLATION  07/2008   for rapid atrial fib  . CARDIAC CATHETERIZATION    . CARDIAC DEFIBRILLATOR PLACEMENT  ~ [address] [place]  . CARDIAC VALVE REPLACEMENT    . CATARACT EXTRACTION W/ INTRAOCULAR LENS  IMPLANT, BILATERAL Bilateral   . ESOPHAGOGASTRODUODENOSCOPY (EGD) WITH PROPOFOL N/A 09/29/2016   Procedure: ESOPHAGOGASTRODUODENOSCOPY (EGD) WITH PROPOFOL;  Surgeon: [name], MD;  Location: [facility] ENDOSCOPY;  Service: Endoscopy;   Laterality: N/A;  . HERNIA REPAIR    . IMPLANTABLE CARDIOVERTER DEFIBRILLATOR (ICD) GENERATOR CHANGE N/A 02/06/2012   Procedure: ICD GENERATOR CHANGE;  Surgeon: [name], MD;  Location: [facility] C[facility] LAB;  Service: Cardiovascular;  Laterality: N/A;  . INSERT / REPLACE / REMOVE PACEMAKER    . MASS BIOPSY Left    neck mass  . MITRAL VALVE REPLACEMENT Right 07/17/2008   59mm Medtronic Mosaic porcine bioprosthesis  . PENILE PROSTHESIS IMPLANT    . RIGHT HEART CATHETERIZATION N/A 08/16/2013   Procedure: RIGHT HEART CATH;  Surgeon: Josue Hector, MD;  Location: [facility] CATH LAB;  Service: Cardiovascular;  Laterality:  N/A;  . TEE WITHOUT CARDIOVERSION N/A 09/06/2016   Procedure: TRANSESOPHAGEAL ECHOCARDIOGRAM (TEE);  Surgeon: Dorothy Spark, MD;  Location: [facility] ENDOSCOPY;  Service: Cardiovascular;  Laterality: N/A;  . THORACOTOMY Right 1970's   spontaneous pneumothorax - while in the [place]  . TOE SURGERY     left foot hammer toe  . TONSILLECTOMY    . VIDEO ASSISTED THORACOSCOPY (VATS)/WEDGE RESECTION Left 11/29/2013   Procedure: Video assisted thoracoscopy for wedge resection; mini thoracotomy;  Surgeon: [name], MD;  Location: [facility];  Service: Thoracic;  Laterality: Left;    REVIEW OF SYSTEMS:  A comprehensive review of systems was negative except for: Respiratory: positive for dyspnea on exertion   PHYSICAL EXAMINATION: General appearance: alert, cooperative and no distress Head: Normocephalic, without obvious abnormality, atraumatic Neck: no adenopathy, no JVD, supple, symmetrical, trachea midline and thyroid not enlarged, symmetric, no tenderness/mass/nodules Lymph nodes: Cervical, supraclavicular, and axillary nodes normal. Resp: clear to auscultation bilaterally Back: negative, symmetric, no curvature. ROM normal. No CVA tenderness. Cardio: regular rate and rhythm, S1, S2 normal, no murmur, click, rub or gallop GI: soft, non-tender; bowel sounds normal; no masses,  no organomegaly  Extremities: extremities normal, atraumatic, no cyanosis or edema  ECOG PERFORMANCE STATUS: 1 - Symptomatic but completely ambulatory  Blood pressure (!) 113/54, pulse 72, temperature 98 F (36.7 C), temperature source Oral, resp. rate 18, height 6\' 1"  (1.854 m), weight 163 lb (73.9 kg), SpO2 93 %.  LABORATORY DATA: Lab Results  Component Value Date   WBC 9.2 12/21/2018   HGB 11.0 (L) 12/21/2018   HCT 37.8 (L) 12/21/2018   MCV 86.7 12/21/2018   PLT 240 12/21/2018      Chemistry      Component Value Date/Time   NA 141 12/21/2018 1325   NA 140 12/20/2016 0746   K 4.6 12/21/2018 1325   K 5.1 12/20/2016 0746   CL 102 12/21/2018 1325   CO2 23 12/21/2018 1325   CO2 29 12/20/2016 0746   BUN 27 (H) 12/21/2018 1325   BUN 20.6 12/20/2016 0746   CREATININE 1.16 12/21/2018 1325   CREATININE 1.2 12/20/2016 0746      Component Value Date/Time   CALCIUM 9.5 12/21/2018 1325   CALCIUM 10.0 12/20/2016 0746   ALKPHOS 59 12/21/2018 1325   ALKPHOS 64 12/20/2016 0746   AST 11 (L) 12/21/2018 1325   AST 11 12/20/2016 0746   ALT 9 12/21/2018 1325   ALT 8 12/20/2016 0746   BILITOT 1.6 (H) 12/21/2018 1325   BILITOT 3.01 (H) 12/20/2016 0746       RADIOGRAPHIC STUDIES: Ct Chest W Contrast  Result Date: 12/21/2018 CLINICAL DATA:  Follow-up lung cancer EXAM: CT CHEST WITH CONTRAST TECHNIQUE: Multidetector CT imaging of the chest was performed during intravenous contrast administration. CONTRAST:  51mL OMNIPAQUE IOHEXOL 300 MG/ML  SOLN COMPARISON:  12/22/2017 FINDINGS: Cardiovascular: Cardiomegaly with left atrial enlargement. No evidence of thoracic aortic aneurysm. Mild atherosclerotic calcifications of the aortic arch. Three vessel coronary atherosclerosis. Mediastinum/Nodes: Small mediastinal nodes, including a 14 mm short axis AP window node, previously 12 mm. Dominant 19 mm short axis subcarinal node (series 2/image 81), previously 16 mm. 13 mm short axis right hilar node (series 2/image  83), unchanged. Visualized thyroid is unremarkable. Lungs/Pleura: Status post left upper lobe wedge resection. Moderate centrilobular and paraseptal emphysematous changes, upper lung predominant. 3 mm subpleural left upper lobe nodule (series 5/image 64), unchanged. No new/suspicious pulmonary nodules. Scattered calcified granulomata, left lower lobe predominant, benign. Mild  dependent atelectasis in the bilateral lower lobes. No focal consolidation. Trace pleural fluid in the right lung base.  No pneumothorax. Upper Abdomen: Visualized upper abdomen is grossly unremarkable, noting vascular calcifications. Musculoskeletal: Mild degenerative changes of the visualized thoracolumbar spine. IMPRESSION: Status post left upper lobe wedge resection. Mild thoracic lymphadenopathy, including a 19 mm short axis subcarinal node, mildly progressive. Nodal metastases are not excluded. Continued attention on follow-up is suggested. Otherwise, no evidence of metastatic disease. Aortic Atherosclerosis (ICD10-I70.0) and Emphysema (ICD10-J43.9). Electronically Signed   By: Julian Hy M.D.   On: 12/21/2018 15:13    ASSESSMENT AND PLAN:  This is a very pleasant 73 years old white male with a stage IA non-small cell lung cancer status post wedge resection of the left upper lobe in 2015. The patient has been on observation since that time. He had repeat CT scan of the chest performed recently.  The scan showed mild thoracic lymphadenopathy with enlargement compared to the previous exam. I discussed the scan results with the patient today and recommended for him to have repeat CT scan of the chest in 3 months for further evaluation of the mediastinal lymphadenopathy. He was advised to call immediately if he has any other concerning symptoms in the interval. The patient voices understanding of current disease status and treatment options and is in agreement with the current care plan.  All questions were answered. The  patient knows to call the clinic with any problems, questions or concerns. We can certainly see the patient much sooner if necessary.  Disclaimer: This note was dictated with voice recognition software. Similar sounding words can inadvertently be transcribed and may not be corrected upon review.

## 2018-12-25 ENCOUNTER — Ambulatory Visit (HOSPITAL_COMMUNITY)
Admission: RE | Admit: 2018-12-25 | Discharge: 2018-12-25 | Disposition: A | Discharge status: Home / Self Care | Payer: Medicare Other | Source: Ambulatory Visit | Attending: Thoracic Surgery (Cardiothoracic Vascular Surgery) | Admitting: Thoracic Surgery (Cardiothoracic Vascular Surgery)

## 2018-12-25 DIAGNOSIS — I77810 Thoracic aortic ectasia: Secondary | ICD-10-CM | POA: Diagnosis not present

## 2018-12-25 DIAGNOSIS — Z953 Presence of xenogenic heart valve: Secondary | ICD-10-CM | POA: Diagnosis not present

## 2018-12-25 DIAGNOSIS — I712 Thoracic aortic aneurysm, without rupture, unspecified: Secondary | ICD-10-CM

## 2018-12-25 MED ORDER — NITROGLYCERIN 0.4 MG SL SUBL
0.8000 mg | SUBLINGUAL_TABLET | SUBLINGUAL | Status: DC | PRN
  Administered 2018-12-25: 0.8 mg via SUBLINGUAL

## 2018-12-25 MED ORDER — IOHEXOL 350 MG/ML SOLN
100.0000 mL | Freq: Once | INTRAVENOUS | Status: AC | PRN
  Administered 2018-12-25: 100 mL via INTRAVENOUS

## 2018-12-25 MED ORDER — METOPROLOL TARTRATE 5 MG/5ML IV SOLN: 5.0000 mg | INTRAVENOUS | Status: DC | PRN

## 2018-12-25 MED ORDER — NITROGLYCERIN 0.4 MG SL SUBL
SUBLINGUAL_TABLET | SUBLINGUAL | Status: AC
  Filled 2018-12-25: qty 2

## 2018-12-26 ENCOUNTER — Telehealth: Payer: Self-pay | Admitting: Internal Medicine

## 2018-12-26 NOTE — Telephone Encounter (Signed)
Scheduled appt per los. Mailed printout.

## 2018-12-31 ENCOUNTER — Encounter: Payer: Self-pay | Admitting: Thoracic Surgery (Cardiothoracic Vascular Surgery)

## 2018-12-31 ENCOUNTER — Other Ambulatory Visit: Payer: Self-pay

## 2018-12-31 ENCOUNTER — Ambulatory Visit (INDEPENDENT_AMBULATORY_CARE_PROVIDER_SITE_OTHER): Payer: Medicare Other | Admitting: Thoracic Surgery (Cardiothoracic Vascular Surgery)

## 2018-12-31 VITALS — BP 93/53 | HR 74 | Temp 97.7°F | Resp 16 | Ht 73.0 in | Wt 163.0 lb

## 2018-12-31 DIAGNOSIS — Q2549 Other congenital malformations of aorta: Secondary | ICD-10-CM

## 2018-12-31 DIAGNOSIS — Z953 Presence of xenogenic heart valve: Secondary | ICD-10-CM | POA: Diagnosis not present

## 2018-12-31 NOTE — Progress Notes (Signed)
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  He has numerous chronic medical problems including a remote history of heavy tobacco abuse, COPD for which he remains O2 dependent, hypertension, nonischemic cardiomyopathy, permanent atrial fibrillation status post AV node ablation with permanent pacemaker and ICD placement, and chronic combined systolic and diastolic congestive heart failure. In 2009 the patient presented with bacterial endocarditis complicated by embolic stroke, severe mitral regurgitation and congestive heart failure.  He ultimately underwent mitral valve replacement using a bioprosthetic tissue valve, closure of secundum type atrial septal defect, and over sewing of left atrial appendage via a right mini thoracotomy approach in January 2010.  He recovered from his surgery uneventfully and remained stable until 2015 when he was hospitalized for pneumonia, COPD exacerbation, an acute exacerbation of congestive heart failure. CT scan performed at that time revealed a small spiculated lesion in the left upper lobe for which he eventually underwent video-assisted thoracoscopy for wedge resection for what turned out to be T1aN0 stage IA adenocarcinoma of the lung.  He again recovered uneventfully although he has remained oxygen dependent ever since. He has been followed regularly by Dr. [name] and remains cancer free at this time.  He has had multiple CT scans of the chest performed  ever since his surgery for lung cancer in 2015 which have documented the presence of dilated aortic root, despite the fact that no reports were made until 2018 when the aortic root appeared dilated on echocardiogram.  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  The patient is felt to be very poor candidate for elective surgical repair and has been followed carefully with serial imaging ever since.   Patient was last seen in our office on October 23, 2017.  He returns her office for routine follow-up today and reports that he has remained quite stable for the past year.  He specifically denies any symptoms of chest pain which might be attributed to his aortic aneurysm.  He has chronic shortness of breath but this has not changed.  He remains reasonably functional despite his underlying limitations related to chronic respiratory failure.  He reports no new problems or complaints.   Current Outpatient Medications  Medication Sig Dispense Refill   amoxicillin (AMOXIL) 500 MG capsule Take 2,000 mg by mouth See admin instructions. Take 2,000 mg by mouth one hour prior to dental procedures     aspirin EC 81 MG tablet Take 1 tablet (81 mg total) by mouth daily. 90 tablet 3   atorvastatin (LIPITOR) 10 MG tablet TAKE 1 TABLET BY MOUTH EVERY DAY 90 tablet 3   budesonide-formoterol (SYMBICORT) 160-4.5 MCG/ACT inhaler INHALE 2 PUFFS INTO THE LUNGS TWICE A DAY 10.2 Inhaler 6   carvedilol (COREG) 3.125 MG tablet TAKE 1 TABLET BY MOUTH TWICE A DAY WITH MEALS 180 tablet 1  cetirizine (ZYRTEC) 10 MG tablet Take 10 mg by mouth daily.     furosemide (LASIX) 40 MG tablet TAKE 1 TABLET BY MOUTH EVERY DAY 90 tablet 3   metFORMIN (GLUCOPHAGE) 1000 MG tablet TAKE 1 TABLET BY MOUTH TWICE A DAY WITH MEALS 180 tablet 0   nitroGLYCERIN (NITROSTAT) 0.4 MG SL tablet Place 0.4 mg under the tongue every 5 (five)  minutes x 3 doses as needed for chest pain.      Olopatadine HCl (PAZEO) 0.7 % SOLN Apply 1 drop to eye daily. 1 Bottle 0   OXYGEN Inhale 2 L into the lungs continuous.      valsartan (DIOVAN) 40 MG tablet TAKE 1 TABLET BY MOUTH EVERY DAY 90 tablet 2   No current facility-administered medications for this visit.       Physical Exam:   BP (!) 93/53 (BP Location: Left Arm, Patient Position: Sitting, Cuff Size: Normal)    Pulse 74    Temp 97.7 F (36.5 C) (Skin)    Resp 16    Ht 6\' 1"  (1.854 m)    Wt 163 lb (73.9 kg)    SpO2 91% Comment: ON 2L O2 CONT   BMI 21.51 kg/m   General:  Chronically ill-appearing in no distress  Chest:   Few inspiratory crackles with symmetrical breath sounds  CV:   Regular rate and rhythm without murmur  Incisions:  n/a  Abdomen:  Soft nontender  Extremities:  Warm and well-perfused  Diagnostic Tests:  Transthoracic Echocardiography  Patient:    [name], [name] MR #:       [id number] Study Date: 08/01/2018 Gender:     M Age:        38 Height:     185.4 cm Weight:     77.6 kg BSA:        2 m^2 Pt. Status: Room:       2W09C   SONOGRAPHER  Johny Chess, RDCS, CCT  Laverda Page, Diamantina Providence M  ATTENDING    [facility]ler T  PERFORMING   [facility], Inpatient  ADMITTING    Cristal Deer  cc:  ------------------------------------------------------------------- LV EF: 35% -   40%  ------------------------------------------------------------------- Indications:      Chest pain 786.51.  ------------------------------------------------------------------- History:   PMH:   Atrial fibrillation.  Congestive heart failure. Chronic obstructive pulmonary disease.  Risk factors: Hypertension. Dyslipidemia.  ------------------------------------------------------------------- Study Conclusions  - Left ventricle: The cavity size was normal. There was mild   concentric hypertrophy. Systolic function was moderately reduced.   The estimated  ejection fraction was in the range of 35% to 40%.   Diffuse hypokinesis. Akinesis of the apical myocardium. - Aortic valve: Transvalvular velocity was within the normal range.   There was no stenosis. There was mild regurgitation. - Aorta: There was dilation of the sinus(es) of Valsalva. Aortic   root dimension: 47 mm (ED). Ascending aortic diameter: 42.1 mm   (S). - Mitral valve: A 22mm Medtronic Mosaic bioprosthesis was present.   Transvalvular velocity was within the normal range. There was no   evidence for stenosis. There was trivial regurgitation. Pressure   half-time: 170 ms. Mean gradient (D): 10 mm Hg. Valve area by   pressure half-time: 1.29 cm^2. Valve area by continuity equation   (using LVOT flow): 1.33 cm^2. - Left atrium: The atrium was severely dilated. - Right ventricle: The cavity size was normal. Wall thickness was   normal. Systolic function was moderately reduced. - Right  atrium: The atrium was severely dilated. - Tricuspid valve: There was trivial regurgitation. - Pulmonary arteries: Systolic pressure was mildly to moderately   increased. PA peak pressure: 49 mm Hg (S).  Impressions:  - Bioprosthetic mitral valve mean gradient has increased from 7   mmHg in 08/2016 to 10 mmHg. Based on the DVI and PHT, findings are   most consistent with patient prosthesis mismatch. There is an   aortic root aneurysm that is unchanged from 07/2016.  ------------------------------------------------------------------- Study data:  Comparison was made to the study of 09/06/2016.  Study status:  Routine.  Procedure:  The patient reported no pain pre or post test. Transthoracic echocardiography. Image quality was adequate.          Transthoracic echocardiography.  M-mode, complete 2D, spectral Doppler, and color Doppler.  Birthdate: Patient birthdate: 24-Sep-1945.  Age:  Patient is 73 yr old.  Sex: Gender: male.    BMI: 22.6 kg/m^2.  Blood pressure:     114/68 Patient status:   Inpatient.  Study date:  Study date: 08/01/2018. Study time: 03:07 PM.  Location:  [facility] laboratory.  -------------------------------------------------------------------  ------------------------------------------------------------------- Left ventricle:  The cavity size was normal. There was mild concentric hypertrophy. Systolic function was moderately reduced. The estimated ejection fraction was in the range of 35% to 40%. Diffuse hypokinesis.  Regional wall motion abnormalities: Akinesis of the apical myocardium.  ------------------------------------------------------------------- Aortic valve:   Trileaflet; mildly thickened, mildly calcified leaflets. Mobility was not restricted.  Doppler:  Transvalvular velocity was within the normal range. There was no stenosis. There was mild regurgitation.  ------------------------------------------------------------------- Aorta:  There was dilation of the sinus(es) of Valsalva. Aortic root: The aortic root was normal in size.  ------------------------------------------------------------------- Mitral valve:  A 46mm Medtronic Mosaic bioprosthesis was present. Mobility was not restricted.  Doppler:  Transvalvular velocity was within the normal range. There was no evidence for stenosis. There was trivial regurgitation.    Valve area by pressure half-time: 1.29 cm^2. Indexed valve area by pressure half-time: 0.65 cm^2/m^2. Valve area by continuity equation (using LVOT flow): 1.33 cm^2. Indexed valve area by continuity equation (using LVOT flow): 0.67 cm^2/m^2.    Mean gradient (D): 10 mm Hg.  ------------------------------------------------------------------- Left atrium:  The atrium was severely dilated.  ------------------------------------------------------------------- Right ventricle:  The cavity size was normal. Wall thickness was normal. Pacer wire or catheter noted in right ventricle. Systolic function was moderately  reduced.  ------------------------------------------------------------------- Pulmonic valve:    Structurally normal valve.   Cusp separation was normal.  Doppler:  Transvalvular velocity was within the normal range. There was no evidence for stenosis. There was no regurgitation.  ------------------------------------------------------------------- Tricuspid valve:   Structurally normal valve.    Doppler: Transvalvular velocity was within the normal range. There was trivial regurgitation.  ------------------------------------------------------------------- Pulmonary artery:   The main pulmonary artery was normal-sized. Systolic pressure was mildly to moderately increased.  ------------------------------------------------------------------- Right atrium:  The atrium was severely dilated. Pacer wire or catheter noted in right atrium.  ------------------------------------------------------------------- Pericardium:  There was no pericardial effusion.  ------------------------------------------------------------------- Systemic veins: Inferior vena cava: The vessel was normal in size. The respirophasic diameter changes were in the normal range (= 50%), consistent with normal central venous pressure.  ------------------------------------------------------------------- Measurements   Left ventricle                           Value          Reference  LV ID, ED, PLAX chordal          (  H)     53    mm       43 - 52  LV ID, ES, PLAX chordal          (H)     44    mm       23 - 38  LV fx shortening, PLAX chordal   (L)     17    %        >=29  LV PW thickness, ED                      15    mm       ----------  IVS/LV PW ratio, ED                      0.73           <=1.3  Stroke volume, 2D                        87    ml       ----------  Stroke volume/bsa, 2D                    44    ml/m^2   ----------    Ventricular septum                       Value          Reference  IVS  thickness, ED                        11    mm       ----------    LVOT                                     Value          Reference  LVOT ID, S                               27    mm       ----------  LVOT area                                5.73  cm^2     ----------  LVOT peak velocity, S                    79.4  cm/s     ----------  LVOT mean velocity, S                    48.3  cm/s     ----------  LVOT VTI, S                              15.1  cm       ----------    Aorta                                    Value          Reference  Aortic root ID, ED                       47    mm       ----------  Ascending aorta ID, A-P, S               42.1  mm       ----------    Left atrium                              Value          Reference  LA ID, A-P, ES                           73    mm       ----------  LA ID/bsa, A-P                   (H)     3.66  cm/m^2   <=2.2  LA volume, ES, 1-p A4C                   181   ml       ----------  LA volume/bsa, ES, 1-p A4C               90.6  ml/m^2   ----------    Mitral valve                             Value          Reference  Mitral mean velocity, D                  151   cm/s     ----------  Mitral pressure half-time                170   ms       ----------  Mitral mean gradient, D                  10    mm Hg    ----------  Mitral valve area, PHT, DP               1.29  cm^2     ----------  Mitral valve area/bsa, PHT, DP           0.65  cm^2/m^2 ----------  Mitral valve area, LVOT                  1.33  cm^2     ----------  continuity  Mitral valve area/bsa, LVOT              0.67  cm^2/m^2 ----------  continuity  Mitral annulus VTI, D                    65.3  cm       ----------    Pulmonary arteries                       Value          Reference  PA pressure, S, DP               (H)     49    mm Hg    <=30  Tricuspid valve                          Value          Reference  Tricuspid regurg peak velocity           320   cm/s      ----------  Tricuspid peak RV-RA gradient            41    mm Hg    ----------    Right atrium                             Value          Reference  RA ID, S-I, ES, A4C              (H)     68.4  mm       34 - 49  RA area, ES, A4C                 (H)     32.1  cm^2     8.3 - 19.5  RA volume, ES, A/L                       124   ml       ----------  RA volume/bsa, ES, A/L                   62.1  ml/m^2   ----------    Systemic veins                           Value          Reference  Estimated CVP                            8     mm Hg    ----------    Right ventricle                          Value          Reference  TAPSE                                    10.7  mm       ----------  RV pressure, S, DP               (H)     49    mm Hg    <=30  RV s&', lateral, S                        6.58  cm/s     ----------  Legend: (L)  and  (H)  mark values outside specified reference range.  ------------------------------------------------------------------- Prepared and Electronically Authenticated by  Skeet Latch, MD 2020-01-22T18:38:09   Cardiac CTA  MEDICATIONS: Sub lingual nitro. 4mg  and lopressor 5mg   TECHNIQUE: The patient was scanned on a Siemens Force 485 slice scanner. Gantry rotation speed was 250 msecs. Collimation was .6 mm. A 100 kV prospective scan was triggered in the ascending thoracic aorta at 140 HU's Full mA was used between 35% and 75% of the  R-R interval. Average HR during the scan was 74 bpm. The 3D data set was interpreted on a dedicated work station using MPR, MIP and VRT modes. A total of 80 cc of contrast was used.  FINDINGS: Aorta: Normal aortic root with bovine arch.  STJ: 33 mm  Aortic Root: 34 mm  Arch: 29 mm  Descending Thoracic aorta: 25 mm  There is a large right sinus of Valsalva aneurysm with impingement of the RVOT. The RCA ostium is above the aneurysm and displaced cephalad  Right Sinus: 61 mm  Left Sinus: 45  mm  Non Sinus: 49 mm  The patient is s/o MVR with 33 mm bioprosthetic Mosaic Medtronic valve. Sewing ring intact  The LV apex is thinned and aneurysmal with no mural thrombus  Pacing wires are seen in the RA/RV  There is severe biatrial enlargement with no LAA thrombus  IMPRESSION: 1. Large right sinus of Valsalva aneurysm measuring 61 mm and impinging on RVOT. RCA emanates above aneurysm and is displaced cephaled  2.  Severe bi atrial enlargement with no LAA thrombus  3.  MVR Medtronic 33 mm Mosaic bioprosthesis with intact sewing ring  4.  Thinned and aneurysmal LV apex no mural thrombus  5.  Pacing wires in RA/RV  6.  Normal aortic root with bovine arch no dissection noted  Findings are similar to CT done on 10/17/17  [name]  Electronically Signed: By: [name] M.D. On: 12/25/2018 10:46   Impression:  I have personally reviewed the patient's recent follow-up cardiac gated CT angiogram of the heart.  He has a large aneurysm involving the right sinus of Valsalva which remains entirely stable.  The patient has numerous comorbid medical problems which would make surgical intervention extremely high risk including chronic systolic congestive heart failure and severe COPD with chronic respiratory failure on home oxygen therapy.    Plan:  We plan follow-up cardiac gated CT angiogram in 1 year.  I spent in excess of 20 minutes during the conduct of this office consultation and >50% of this time involved direct face-to-face encounter with the patient for counseling and/or coordination of their care.    Valentina Gu. Roxy Manns, MD 12/31/2018 1:28 PM

## 2018-12-31 NOTE — Patient Instructions (Signed)
Continue all previous medications without any changes at this time  

## 2019-01-02 ENCOUNTER — Other Ambulatory Visit: Payer: Self-pay | Admitting: Adult Health

## 2019-01-02 DIAGNOSIS — E119 Type 2 diabetes mellitus without complications: Secondary | ICD-10-CM

## 2019-01-03 NOTE — Telephone Encounter (Signed)
Sent to the pharmacy by e-scribe.  Pt now scheduled for A1c and follow up.

## 2019-01-08 ENCOUNTER — Other Ambulatory Visit (INDEPENDENT_AMBULATORY_CARE_PROVIDER_SITE_OTHER): Payer: Medicare Other

## 2019-01-08 ENCOUNTER — Other Ambulatory Visit: Payer: Self-pay

## 2019-01-08 DIAGNOSIS — E118 Type 2 diabetes mellitus with unspecified complications: Secondary | ICD-10-CM

## 2019-01-08 LAB — HEMOGLOBIN A1C: Hgb A1c MFr Bld: 7 % — ABNORMAL HIGH (ref 4.6–6.5)

## 2019-01-09 ENCOUNTER — Encounter: Payer: Self-pay | Admitting: Adult Health

## 2019-01-09 ENCOUNTER — Ambulatory Visit (INDEPENDENT_AMBULATORY_CARE_PROVIDER_SITE_OTHER): Payer: Medicare Other | Admitting: Adult Health

## 2019-01-09 DIAGNOSIS — E119 Type 2 diabetes mellitus without complications: Secondary | ICD-10-CM | POA: Diagnosis not present

## 2019-01-09 NOTE — Progress Notes (Signed)
Virtual Visit via Telephone Note  I connected with [name] on 01/09/19 at 11:00 AM EDT by telephone and verified that I am speaking with the correct person using two identifiers.   I discussed the limitations, risks, security and privacy concerns of performing an evaluation and management service by telephone and the availability of in person appointments. I also discussed with the patient that there may be a patient responsible charge related to this service. The patient expressed understanding and agreed to proceed.  Location patient: home Location provider: work or home office Participants present for the call: patient, provider Patient did not have a visit in the prior 7 days to address this/these issue(s).   History of Present Illness: 73 year old male who is being evaluated today for follow-up regarding diabetes.  Is currently prescribed metformin 1000 mg twice daily.  Last A1c approximately 9 months ago was 6.9.  He does not monitor his blood sugars at home but tries to eat a heart healthy diet and stay as active as he possibly can.  Today his A1c has increased slightly to 7.0.  Denies episodes of hypoglycemia, nausea, vomiting, or diarrhea.   Observations/Objective: Patient sounds cheerful and well on the phone. I do not appreciate any SOB. Speech and thought processing are grossly intact. Patient reported vitals:  Assessment and Plan: 1. Type 2 diabetes mellitus without complication, without long-term current use of insulin (HCC) -A1c has increased slightly to 7.0.  Still within range for this patient.  We will continue on metformin 1000 mg twice daily.  Will recheck at his physical in September.  Ice follow-up as needed   Follow Up Instructions:  I did not refer this patient for an OV in the next 24 hours for this/these issue(s).  I discussed the assessment and treatment plan with the patient. The patient was provided an opportunity to ask questions and all were  answered. The patient agreed with the plan and demonstrated an understanding of the instructions.   The patient was advised to call back or seek an in-person evaluation if the symptoms worsen or if the condition fails to improve as anticipated.  I provided 13 minutes of non-face-to-face time during this encounter.   [name], NP

## 2019-02-27 ENCOUNTER — Ambulatory Visit (INDEPENDENT_AMBULATORY_CARE_PROVIDER_SITE_OTHER): Payer: Medicare Other | Admitting: *Deleted

## 2019-02-27 DIAGNOSIS — I428 Other cardiomyopathies: Secondary | ICD-10-CM | POA: Diagnosis not present

## 2019-02-27 LAB — CUP PACEART REMOTE DEVICE CHECK
Battery Remaining Longevity: 24 mo
Battery Remaining Percentage: 28 %
Battery Voltage: 2.86 V
Date Time Interrogation Session: 20200819080016
HighPow Impedance: 51 Ohm
HighPow Impedance: 51 Ohm
Implantable Lead Implant Date: 20100113
Implantable Lead Implant Date: 20100113
Implantable Lead Location: 753858
Implantable Lead Location: 753860
Implantable Lead Model: 7122
Implantable Pulse Generator Implant Date: 20130729
Lead Channel Impedance Value: 360 Ohm
Lead Channel Impedance Value: 650 Ohm
Lead Channel Pacing Threshold Amplitude: 0.625 V
Lead Channel Pacing Threshold Amplitude: 1 V
Lead Channel Pacing Threshold Pulse Width: 0.5 ms
Lead Channel Pacing Threshold Pulse Width: 0.5 ms
Lead Channel Sensing Intrinsic Amplitude: 9.7 mV
Lead Channel Setting Pacing Amplitude: 2 V
Lead Channel Setting Pacing Amplitude: 2 V
Lead Channel Setting Pacing Pulse Width: 0.5 ms
Lead Channel Setting Pacing Pulse Width: 0.5 ms
Lead Channel Setting Sensing Sensitivity: 0.5 mV
Pulse Gen Serial Number: 7053988

## 2019-03-07 ENCOUNTER — Encounter: Payer: Self-pay | Admitting: Cardiology

## 2019-03-07 NOTE — Progress Notes (Signed)
Remote ICD transmission.   

## 2019-03-12 ENCOUNTER — Encounter: Payer: Self-pay | Admitting: Cardiology

## 2019-03-12 ENCOUNTER — Ambulatory Visit (INDEPENDENT_AMBULATORY_CARE_PROVIDER_SITE_OTHER): Payer: Medicare Other | Admitting: Cardiology

## 2019-03-12 ENCOUNTER — Other Ambulatory Visit: Payer: Self-pay

## 2019-03-12 VITALS — BP 110/60 | HR 72 | Ht 73.0 in | Wt 158.2 lb

## 2019-03-12 DIAGNOSIS — Q2543 Congenital aneurysm of aorta: Secondary | ICD-10-CM

## 2019-03-12 DIAGNOSIS — Z953 Presence of xenogenic heart valve: Secondary | ICD-10-CM

## 2019-03-12 DIAGNOSIS — I5022 Chronic systolic (congestive) heart failure: Secondary | ICD-10-CM | POA: Diagnosis not present

## 2019-03-12 DIAGNOSIS — J449 Chronic obstructive pulmonary disease, unspecified: Secondary | ICD-10-CM | POA: Diagnosis not present

## 2019-03-12 DIAGNOSIS — I428 Other cardiomyopathies: Secondary | ICD-10-CM

## 2019-03-12 DIAGNOSIS — I719 Aortic aneurysm of unspecified site, without rupture: Secondary | ICD-10-CM

## 2019-03-12 DIAGNOSIS — I7121 Aneurysm of the ascending aorta, without rupture: Secondary | ICD-10-CM

## 2019-03-12 DIAGNOSIS — I4821 Permanent atrial fibrillation: Secondary | ICD-10-CM

## 2019-03-12 NOTE — Progress Notes (Signed)
Cardiology Office Note    Date:  03/12/2019   ID:  [name], DOB 01-29-1946, MRN [id number]  PCP:  [name], NP  Cardiologist:   Candee Furbish, MD (former Dr. Ron Parker)    History of Present Illness:  [name] is a 73 y.o. male former Dr. Ron Parker patient here for follow-up of mitral valve disease, bioprosthetic valve in mitral position. as well as atrial fibrillation. He also had AV node ablation, pacemaker. A fibrillation in the past could not be rate controlled. No anticoagulation because of prior CNS bleed. He had prior left atrial appendage occlusion/tied.Therefore, not on chronic anticoagulation.  He was recently admitted 3/16 09/27/15 for near syncope. Orthostatic hypotension was noted. Since his discharge he had been doing well.  Bilirubin has been chronically increased- apparently is having an ultrasound of his liver.  He developed lung cancer and is responded to treatments. Dr. Earlie Server. Continuous oxygen. Resection.  ICD in place January 2010 several days after his mitral valve surgery. Most recent ejection fraction was 35% in 2015. No CAD 12/8/9 cath.  09/28/17 - no CP, no change in baseline Shortness of breath. O2 at baseline. Doing well. No new complaints.   03/12/2019- here for the follow-up of valvular abnormality.  Was last seen by Dr. Roxy Manns on 12/31/2018- has been quite stable.  Chronic shortness of breath unchanged.  No new complaints.  Past Medical History:  Diagnosis Date  . Atrial fibrillation ([place])    AV Node ablation January, 2010, for rapid atrial fib  . Atrial septal defect    Closed with surgery January, 2010  . Automatic implantable cardioverter-defibrillator in situ    LV dysfunction and pacer needed for AV node lesion  . Cardiomyopathy    non-ischemic  . CHF (congestive heart failure) ([place])   . Chronic combined systolic and diastolic CHF (congestive heart failure) ([place])   . Colon polyps   . COPD (chronic obstructive pulmonary disease) (HCC)    O2- 2 liters, nasal cannula, q night   . COPD GOLD II 01/11/2007   PFT's 09/25/13  FEV1  2.18 (61%) ratio 64 no change p B2 and DLC0  32% corrects to 68% - trial off advair and acei rec starting  08/23/2013     . CVA (cerebral vascular accident) ([place]) 2009   denies residual on 08/14/2013  . Diabetes mellitus without complication ([place])    type 2  . Dyslipidemia   . Dysrhythmia   . Ejection fraction < 50%   . Endocarditis    Bacterial, 2009  . Headache(784.0)    related to stroke only  . HLD (hyperlipidemia)   . Hypertension   . Intracranial hemorrhage (HCC)    Coumadin cannot be used because of the history of his bleed  . Lung cancer ([place]) 11/29/2013   T1N0 Stage Ia non-small cell carcinoma left lung treated with wedge resection  . Mitral regurgitation    Severe symptomatic primary MR due to bacterial endocarditis, treated w/ MVR // Echo 1/18 EF 40-45, diffuse HK, dilated aorta at 42 mm/aortic root 47 mm; linear echogenic structure in ascending aorta - suspect reverberation artifact / consider CT or TEE to rule out dissection flap, bioprosthetic MVR with mean gradient 3, severe LAE, low normal RVSF, severe RAE, moderate TR, PASP 42  . Myocardial infarction ([place]) 2010  . Pacemaker    combo pacer and icd  . Permanent atrial fibrillation    Originally Coumadin use for atrial fibrillation  //   he had  intracerebral hemorrhage with an INR of 2.3 June, 2009. Anticoagulation could no longer be used.  //  Rapid atrial fibrillation after inferior MI October, 2010..........Marland Kitchen AV node ablation done at that time with ICD pacemaker placed (EF 35%).   //   Left atrial appendage tied off at the time of mitral valve surgery January, 2010 (maze pro  . Pneumonia 07/2018  . Prosthetic valve dysfunction    Mild mitral stenosis  . Pulmonary hypertension (HCC)    Moderate  . Renal artery stenosis (HCC)    Mild by history  . 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 Spontaneous pneumothorax    right thoracotomy -  distant past  . Status post minimally invasive mitral valve replacement with bioprosthetic valve    33 mm Medtronic Mosaic porcine bioprosthesis placed via right mini thoracotomy for bacterial endocarditis complicated by severe MR and CHF   . Thoracic aortic aneurysm ([place]) 08/11/2016   a - Chest CTA 1/18:  Aneurysmal dilatation of aortic root is noted at 5.1 cm.     Past Surgical History:  Procedure Laterality Date  . APPENDECTOMY    . ASD REPAIR, SECUNDUM  07/17/2008   pericardial patch closure of ASD  . AV NODE ABLATION  07/2008   for rapid atrial fib  . CARDIAC CATHETERIZATION    . CARDIAC DEFIBRILLATOR PLACEMENT  ~ [address] [place]  . CARDIAC VALVE REPLACEMENT    . CATARACT EXTRACTION W/ INTRAOCULAR LENS  IMPLANT, BILATERAL Bilateral   . ESOPHAGOGASTRODUODENOSCOPY (EGD) WITH PROPOFOL N/A 09/29/2016   Procedure: ESOPHAGOGASTRODUODENOSCOPY (EGD) WITH PROPOFOL;  Surgeon: [name], MD;  Location: [facility] ENDOSCOPY;  Service: Endoscopy;  Laterality: N/A;  . HERNIA REPAIR    . IMPLANTABLE CARDIOVERTER DEFIBRILLATOR (ICD) GENERATOR CHANGE N/A 02/06/2012   Procedure: ICD GENERATOR CHANGE;  Surgeon: [name], MD;  Location: [facility] CATH LAB;  Service: Cardiovascular;  Laterality: N/A;  . INSERT / REPLACE / REMOVE PACEMAKER    . MASS BIOPSY Left    neck mass  . MITRAL VALVE REPLACEMENT Right 07/17/2008   34mm Medtronic Mosaic porcine bioprosthesis  . PENILE PROSTHESIS IMPLANT    . RIGHT HEART CATHETERIZATION N/A 08/16/2013   Procedure: RIGHT HEART CATH;  Surgeon: Josue Hector, MD;  Location: [facility] CATH LAB;  Service: Cardiovascular;  Laterality: N/A;  . TEE WITHOUT CARDIOVERSION N/A 09/06/2016   Procedure: TRANSESOPHAGEAL ECHOCARDIOGRAM (TEE);  Surgeon: Dorothy Spark, MD;  Location: [facility] ENDOSCOPY;  Service: Cardiovascular;  Laterality: N/A;  . THORACOTOMY Right 1970's   spontaneous pneumothorax - while in the [place]  . TOE SURGERY     left foot hammer toe  . TONSILLECTOMY    . VIDEO  ASSISTED THORACOSCOPY (VATS)/WEDGE RESECTION Left 11/29/2013   Procedure: Video assisted thoracoscopy for wedge resection; mini thoracotomy;  Surgeon: [name], MD;  Location: [facility];  Service: Thoracic;  Laterality: Left;    Outpatient Medications Prior to Visit  Medication Sig Dispense Refill  . amoxicillin (AMOXIL) 500 MG capsule Take 2,000 mg by mouth See admin instructions. Take 2,000 mg by mouth one hour prior to dental procedures    . aspirin EC 81 MG tablet Take 1 tablet (81 mg total) by mouth daily. 90 tablet 3  . atorvastatin (LIPITOR) 10 MG tablet TAKE 1 TABLET BY MOUTH EVERY DAY 90 tablet 3  . budesonide-formoterol (SYMBICORT) 160-4.5 MCG/ACT inhaler INHALE 2 PUFFS INTO THE LUNGS TWICE A DAY 10.2 Inhaler 6  . carvedilol (COREG) 3.125 MG tablet TAKE 1  TABLET BY MOUTH TWICE A DAY WITH MEALS 180 tablet 1  . cetirizine (ZYRTEC) 10 MG tablet Take 10 mg by mouth daily.    . furosemide (LASIX) 40 MG tablet TAKE 1 TABLET BY MOUTH EVERY DAY 90 tablet 3  . metFORMIN (GLUCOPHAGE) 1000 MG tablet TAKE 1 TABLET BY MOUTH TWICE A DAY WITH MEALS 180 tablet 0  . nitroGLYCERIN (NITROSTAT) 0.4 MG SL tablet Place 0.4 mg under the tongue every 5 (five) minutes x 3 doses as needed for chest pain.     Marland Kitchen Olopatadine HCl (PAZEO) 0.7 % SOLN Apply 1 drop to eye daily. 1 Bottle 0  . OXYGEN Inhale 2 L into the lungs continuous.     . valsartan (DIOVAN) 40 MG tablet TAKE 1 TABLET BY MOUTH EVERY DAY 90 tablet 2   No facility-administered medications prior to visit.      Allergies:   Anticoagulant compound, Other, and Warfarin sodium   Social History   Socioeconomic History  . Marital status: Married    Spouse name: [name]  . Number of children: 0  . Years of education: College  . Highest education level: Not on file  Occupational History  . 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 Financial resource strain: Not on file  . Food insecurity    Worry: Not  on file    Inability: Not on file  . Transportation needs    Medical: Not on file    Non-medical: Not on file  Tobacco Use  . Smoking status: Former Smoker    Packs/day: 1.00    Years: 45.00    Pack years: 45.00    Types: Cigarettes    Quit date: 08/11/2013    Years since quitting: 5.5  . Smokeless tobacco: Never Used  . Tobacco comment: 08/14/2013 "quit smoking in 2009"  Substance and Sexual Activity  . Alcohol use: No    Alcohol/week: 0.0 standard drinks    Comment: 08/14/2013 "used to drink beer; quit:in 1982"  . Drug use: No  . Sexual activity: Yes  Lifestyle  . Physical activity    Days per week: Not on file    Minutes per session: Not on file  . Stress: Not on file  Relationships  . Social Herbalist on phone: Not on file    Gets together: Not on file    Attends religious service: Not on file    Active member of club or organization: Not on file    Attends meetings of clubs or organizations: Not on file    Relationship status: Not on file  Other Topics Concern  . Not on file  Social History Narrative   Patient lives at home with his spouse.   Caffeine Use: none   Worked for the post office   Has two boys and a girl. All live local.      Family History:  The patient's family history includes Stomach cancer in his father; Stroke in his mother.   ROS:   Please see the history of present illness.    Review of Systems  All other systems reviewed and are negative.      PHYSICAL EXAM:   VS:  BP 110/60   Pulse 72   Ht 6\' 1"  (1.854 m)   Wt 158 lb 3.2 oz (71.8 kg)   SpO2 (!) 82%   BMI 20.87 kg/m    GEN: Well nourished, well developed,  in no acute distress  HEENT: normal  Neck: no JVD, carotid bruits, or masses Cardiac: RRR; soft systolic murmurs, rubs, or gallops,no edema  Respiratory:  Decreased BS bilat GI: soft, nontender, nondistended, + BS MS: no deformity or atrophy  Skin: warm and dry, no rash Neuro:  Alert and Oriented x 3, Strength and  sensation are intact Psych: euthymic mood, full affect   Wt Readings from Last 3 Encounters:  03/12/19 158 lb 3.2 oz (71.8 kg)  12/31/18 163 lb (73.9 kg)  12/24/18 163 lb (73.9 kg)      Studies/Labs Reviewed:   EKG:  EKG is not ordered today.    Recent Labs: 04/06/2018: TSH 2.11 07/30/2018: B Natriuretic Peptide 437.2 08/01/2018: Magnesium 2.1 12/21/2018: ALT 9; BUN 27; Creatinine 1.16; Hemoglobin 11.0; Platelet Count 240; Potassium 4.6; Sodium 141   Lipid Panel    Component Value Date/Time   CHOL 104 04/06/2018 0942   TRIG 115.0 04/06/2018 0942   HDL 48.20 04/06/2018 0942   CHOLHDL 2 04/06/2018 0942   VLDL 23.0 04/06/2018 0942   LDLCALC 33 04/06/2018 0942   LDLDIRECT 80.2 03/31/2011 1411    Additional studies/ records that were reviewed today include:  Prior office notes reviewed, lab work reviewed  ECHO 09/06/16: - Left ventricle: Systolic function was mildly reduced. The   estimated ejection fraction was in the range of 45% to 50%. Wall   motion was normal; there were no regional wall motion   abnormalities. - Mitral valve: S/P MVR with bioprosthetic valve with 33 mm   Medtronic Mosaic porcine bioprosthesis. There is no central   mitral regurgitation or paravalvular leak. There is limited   motion of the posterior leaflet. Mean transmitral gradient is 7   mmHg.   Valve area by pressure half-time: 1.26 cm^2. - Left atrium: The atrium was massively dilated. No evidence of   thrombus in the atrial cavity or appendage but heavy smoke seen   in the left atrium. The appendage was multilobulated and large.   Emptying velocity was decreased. - Right atrium: The atrium was dilated. No evidence of thrombus in   the atrial cavity or appendage.  Impressions:  - Aortic valve leaflets are structurally normal, with normal   coaptation and no regurgitation.   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 The pseudoaneurysm  measures 32 x 25   mm. The communication is seen on both 2D (image 21, 22) and 3D   images (image 26).     S/P MVR with bioprosthetic valve with 33 mm Medtronic Mosaic   porcine bioprosthesis. There is no central mitral regurgitation   or paravalvular leak. There is limited motion of the posterior   leaflet however anterior leaflet opens very well. Mean   transmitral gradient is 7 mmHg that is most probably attributed   to prosthesis/patient mismatch. There is no significant stenosis.  ECHO 08/01/18: - Left ventricle: The cavity size was normal. There was mild   concentric hypertrophy. Systolic function was moderately reduced.   The estimated ejection fraction was in the range of 35% to 40%.   Diffuse hypokinesis. Akinesis of the apical myocardium. - Aortic valve: Transvalvular velocity was within the normal range.   There was no stenosis. There was mild regurgitation. - Aorta: There was dilation of the sinus(es) of Valsalva. Aortic   root dimension: 47 mm (ED). Ascending aortic diameter: 42.1 mm   (S). - Mitral valve: A 59mm Medtronic Mosaic bioprosthesis  was present.   Transvalvular velocity was within the normal range. There was no   evidence for stenosis. There was trivial regurgitation. Pressure   half-time: 170 ms. Mean gradient (D): 10 mm Hg. Valve area by   pressure half-time: 1.29 cm^2. Valve area by continuity equation   (using LVOT flow): 1.33 cm^2. - Left atrium: The atrium was severely dilated. - Right ventricle: The cavity size was normal. Wall thickness was   normal. Systolic function was moderately reduced. - Right atrium: The atrium was severely dilated. - Tricuspid valve: There was trivial regurgitation. - Pulmonary arteries: Systolic pressure was mildly to moderately   increased. PA peak pressure: 49 mm Hg (S).  Impressions:  - Bioprosthetic mitral valve mean gradient has increased from 7   mmHg in 08/2016 to 10 mmHg. Based on the DVI and PHT, findings are    most consistent with patient prosthesis mismatch. There is an   aortic root aneurysm that is unchanged from 07/2016.   Cardiac CT Cardiac CTA  MEDICATIONS: Sub lingual nitro. 4mg  and lopressor 5mg   TECHNIQUE: The patient was scanned on a Siemens Force 371 slice scanner. Gantry rotation speed was 250 msecs. Collimation was .6 mm. A 100 kV prospective scan was triggered in the ascending thoracic aorta at 140 HU's Full mA was used between 35% and 75% of the R-R interval. Average HR during the scan was 74 bpm. The 3D data set was interpreted on a dedicated work station using MPR, MIP and VRT modes. A total of 80 cc of contrast was used.  FINDINGS: Aorta: Normal aortic root with bovine arch.  STJ: 33 mm  Aortic Root: 34 mm  Arch: 29 mm  Descending Thoracic aorta: 25 mm  There is a large right sinus of Valsalva aneurysm with impingement of the RVOT. The RCA ostium is above the aneurysm and displaced cephalad  Right Sinus: 61 mm  Left Sinus: 45 mm  Non Sinus: 49 mm  The patient is s/o MVR with 33 mm bioprosthetic Mosaic Medtronic valve. Sewing ring intact  The LV apex is thinned and aneurysmal with no mural thrombus  Pacing wires are seen in the RA/RV  There is severe biatrial enlargement with no LAA thrombus  IMPRESSION: 1. Large right sinus of Valsalva aneurysm measuring 61 mm and impinging on RVOT. RCA emanates above aneurysm and is displaced cephaled  2. Severe bi atrial enlargement with no LAA thrombus  3. MVR Medtronic 33 mm Mosaic bioprosthesis with intact sewing ring  4. Thinned and aneurysmal LV apex no mural thrombus  5. Pacing wires in RA/RV  6. Normal aortic root with bovine arch no dissection noted  Findings are similar to CT done on 10/17/17  [name]  ASSESSMENT:    1. Cardiomyopathy, nonischemic ([place])   2. Permanent atrial fibrillation   3. History of mitral valve replacement with bioprosthetic valve   4.  Aortic root aneurysm ([place])   5. COPD GOLD II   6. Chronic systolic CHF (congestive heart failure) (HCC)      PLAN:  In order of problems listed above:  1. Sinus of Valsalva aneurysm, right, no changes on CT scan.  Reviewed.  Once again, would be high risk for any surgical procedure.  Continue to monitor, appreciate Dr. Roxy Manns 2. Permament atrial fibrillation-Overall no symptoms. Unfortunately cannot anticoagulate secondary to history of intracranial hemorrhage, however had LAA clipped with surgery. Had rapid atrial fibrillation with inferior MI in October 2010 rate control was always an issue therefore he  went AV nodal ablation with Bi-V-ICD placement. He is pacemaker dependent.  3. Chronic systolic heart failure-ejection fraction 35%. Doing well, no changes. Meds reviewed.  No changes made 4. Orthostatic hypotension-hospitalized previously for this. Improved. No recurrence.  Watch blood pressure. 5. Knows to take antibiotics prior to dental work. Had previous bacterial endocarditis, mitral valve replacement as above coronary arteries were normal prior to valve surgery. 2010. No changes 6. Prior intracranial bleed occurred in 2009 with INR of 2.3. After that point, he was no longer anticoagulated. Once again LAA clipped.  No changes 7. Has a history of stage IA non-small cell lung carcinoma left wedge resection. Home oxygen.  Wearing under mask 8. Solar purpura - asa. Noted on exam.  No changes 9. Elevated bilirubin-has been evaluated.  Stable.    Medication Adjustments/Labs and Tests Ordered: Current medicines are reviewed at length with the patient today.  Concerns regarding medicines are outlined above.  Medication changes, Labs and Tests ordered today are listed in the Patient Instructions below. Patient Instructions  Medication Instructions:  The current medical regimen is effective;  continue present plan and medications.  If you need a refill on your cardiac medications before your  next appointment, please call your pharmacy.      Follow-Up: At [facility], you and your health needs are our priority.  As part of our continuing mission to provide you with exceptional heart care, we have created designated Provider Care Teams.  These Care Teams include your primary Cardiologist (physician) and Advanced Practice Providers (APPs -  Physician Assistants and Nurse Practitioners) who all work together to provide you with the care you need, when you need it. You will need a follow up appointment in 6 months with Cecilie Kicks, NP and Dr Marlou Porch in 1 year.  Please call our office 2 months in advance to schedule this appointment.  You may see Candee Furbish, MD or one of the following Advanced Practice Providers on your designated Care Team:   Truitt Merle, NP Cecilie Kicks, NP . Kathyrn Drown, NP  Thank you for choosing [facility]!!          Signed, Candee Furbish, MD  03/12/2019 10:38 AM    [facility] [place], [place], [place]  [address] Phone: [phone]; Fax: [phone]

## 2019-03-12 NOTE — Patient Instructions (Signed)
Medication Instructions:  The current medical regimen is effective;  continue present plan and medications.  If you need a refill on your cardiac medications before your next appointment, please call your pharmacy.   Follow-Up: At [facility], you and your health needs are our priority.  As part of our continuing mission to provide you with exceptional heart care, we have created designated Provider Care Teams.  These Care Teams include your primary Cardiologist (physician) and Advanced Practice Providers (APPs -  Physician Assistants and Nurse Practitioners) who all work together to provide you with the care you need, when you need it. You will need a follow up appointment in 6 months with Laura Ingold, NP and Dr Skains in 1 year.  Please call our office 2 months in advance to schedule this appointment.  You may see Mark Skains, MD or one of the following Advanced Practice Providers on your designated Care Team:   Lori Gerhardt, NP Laura Ingold, NP . Jill McDaniel, NP  Thank you for choosing [facility]!!      

## 2019-03-19 ENCOUNTER — Other Ambulatory Visit: Payer: Self-pay | Admitting: Adult Health

## 2019-03-19 DIAGNOSIS — Z85828 Personal history of other malignant neoplasm of skin: Secondary | ICD-10-CM | POA: Diagnosis not present

## 2019-03-19 DIAGNOSIS — L821 Other seborrheic keratosis: Secondary | ICD-10-CM | POA: Diagnosis not present

## 2019-03-19 DIAGNOSIS — D1801 Hemangioma of skin and subcutaneous tissue: Secondary | ICD-10-CM | POA: Diagnosis not present

## 2019-03-19 DIAGNOSIS — L570 Actinic keratosis: Secondary | ICD-10-CM | POA: Diagnosis not present

## 2019-03-19 DIAGNOSIS — D692 Other nonthrombocytopenic purpura: Secondary | ICD-10-CM | POA: Diagnosis not present

## 2019-03-19 NOTE — Telephone Encounter (Signed)
Sent to the pharmacy by e-scribe for 90 days.  Pt has upcoming cpx on 04/05/2019

## 2019-03-22 ENCOUNTER — Telehealth: Payer: Self-pay | Admitting: Internal Medicine

## 2019-03-22 ENCOUNTER — Inpatient Hospital Stay: Payer: Medicare Other

## 2019-03-22 ENCOUNTER — Ambulatory Visit (HOSPITAL_COMMUNITY): Admission: RE | Admit: 2019-03-22 | Payer: Medicare Other | Source: Ambulatory Visit

## 2019-03-22 NOTE — Telephone Encounter (Signed)
Returned phone call regarding rescheduling a missed appointment, informed patient he will need to call to reschedule CT scan before scheduling any other appointments. Cancelled upcoming follow up and lab appointment, I will reschedule once CT scan has been rescheduled.  Message to provider.

## 2019-03-22 NOTE — Telephone Encounter (Signed)
Opened by accident

## 2019-03-25 ENCOUNTER — Ambulatory Visit (HOSPITAL_COMMUNITY)
Admission: RE | Admit: 2019-03-25 | Discharge: 2019-03-25 | Disposition: A | Discharge status: Home / Self Care | Payer: Medicare Other | Source: Ambulatory Visit | Attending: Internal Medicine | Admitting: Internal Medicine

## 2019-03-25 ENCOUNTER — Inpatient Hospital Stay: Payer: Medicare Other | Attending: Internal Medicine

## 2019-03-25 ENCOUNTER — Other Ambulatory Visit: Payer: Self-pay

## 2019-03-25 DIAGNOSIS — Z7984 Long term (current) use of oral hypoglycemic drugs: Secondary | ICD-10-CM | POA: Diagnosis not present

## 2019-03-25 DIAGNOSIS — J449 Chronic obstructive pulmonary disease, unspecified: Secondary | ICD-10-CM | POA: Insufficient documentation

## 2019-03-25 DIAGNOSIS — E785 Hyperlipidemia, unspecified: Secondary | ICD-10-CM | POA: Insufficient documentation

## 2019-03-25 DIAGNOSIS — Z85118 Personal history of other malignant neoplasm of bronchus and lung: Secondary | ICD-10-CM | POA: Insufficient documentation

## 2019-03-25 DIAGNOSIS — I10 Essential (primary) hypertension: Secondary | ICD-10-CM | POA: Insufficient documentation

## 2019-03-25 DIAGNOSIS — Z950 Presence of cardiac pacemaker: Secondary | ICD-10-CM | POA: Diagnosis not present

## 2019-03-25 DIAGNOSIS — C3490 Malignant neoplasm of unspecified part of unspecified bronchus or lung: Secondary | ICD-10-CM

## 2019-03-25 DIAGNOSIS — R590 Localized enlarged lymph nodes: Secondary | ICD-10-CM | POA: Insufficient documentation

## 2019-03-25 DIAGNOSIS — Z8673 Personal history of transient ischemic attack (TIA), and cerebral infarction without residual deficits: Secondary | ICD-10-CM | POA: Diagnosis not present

## 2019-03-25 DIAGNOSIS — Z7951 Long term (current) use of inhaled steroids: Secondary | ICD-10-CM | POA: Diagnosis not present

## 2019-03-25 DIAGNOSIS — J90 Pleural effusion, not elsewhere classified: Secondary | ICD-10-CM | POA: Diagnosis not present

## 2019-03-25 DIAGNOSIS — I252 Old myocardial infarction: Secondary | ICD-10-CM | POA: Insufficient documentation

## 2019-03-25 DIAGNOSIS — Z79899 Other long term (current) drug therapy: Secondary | ICD-10-CM | POA: Insufficient documentation

## 2019-03-25 DIAGNOSIS — Z7982 Long term (current) use of aspirin: Secondary | ICD-10-CM | POA: Diagnosis not present

## 2019-03-25 DIAGNOSIS — I4821 Permanent atrial fibrillation: Secondary | ICD-10-CM | POA: Diagnosis not present

## 2019-03-25 DIAGNOSIS — Z95810 Presence of automatic (implantable) cardiac defibrillator: Secondary | ICD-10-CM | POA: Diagnosis not present

## 2019-03-25 DIAGNOSIS — C3412 Malignant neoplasm of upper lobe, left bronchus or lung: Secondary | ICD-10-CM | POA: Diagnosis not present

## 2019-03-25 DIAGNOSIS — E119 Type 2 diabetes mellitus without complications: Secondary | ICD-10-CM | POA: Diagnosis not present

## 2019-03-25 LAB — CBC WITH DIFFERENTIAL (CANCER CENTER ONLY)
Abs Immature Granulocytes: 0.03 10*3/uL (ref 0.00–0.07)
Basophils Absolute: 0.1 10*3/uL (ref 0.0–0.1)
Basophils Relative: 1 %
Eosinophils Absolute: 0.2 10*3/uL (ref 0.0–0.5)
Eosinophils Relative: 2 %
HCT: 33.3 % — ABNORMAL LOW (ref 39.0–52.0)
Hemoglobin: 9.8 g/dL — ABNORMAL LOW (ref 13.0–17.0)
Immature Granulocytes: 0 %
Lymphocytes Relative: 9 %
Lymphs Abs: 0.7 10*3/uL (ref 0.7–4.0)
MCH: 23.7 pg — ABNORMAL LOW (ref 26.0–34.0)
MCHC: 29.4 g/dL — ABNORMAL LOW (ref 30.0–36.0)
MCV: 80.4 fL (ref 80.0–100.0)
Monocytes Absolute: 0.8 10*3/uL (ref 0.1–1.0)
Monocytes Relative: 10 %
Neutro Abs: 6.2 10*3/uL (ref 1.7–7.7)
Neutrophils Relative %: 78 %
Platelet Count: 259 10*3/uL (ref 150–400)
RBC: 4.14 MIL/uL — ABNORMAL LOW (ref 4.22–5.81)
RDW: 17.7 % — ABNORMAL HIGH (ref 11.5–15.5)
WBC Count: 8 10*3/uL (ref 4.0–10.5)
nRBC: 0 % (ref 0.0–0.2)

## 2019-03-25 LAB — CMP (CANCER CENTER ONLY)
ALT: 6 U/L (ref 0–44)
AST: 9 U/L — ABNORMAL LOW (ref 15–41)
Albumin: 3.9 g/dL (ref 3.5–5.0)
Alkaline Phosphatase: 62 U/L (ref 38–126)
Anion gap: 12 (ref 5–15)
BUN: 26 mg/dL — ABNORMAL HIGH (ref 8–23)
CO2: 25 mmol/L (ref 22–32)
Calcium: 9 mg/dL (ref 8.9–10.3)
Chloride: 102 mmol/L (ref 98–111)
Creatinine: 1.24 mg/dL (ref 0.61–1.24)
GFR, Est AFR Am: >60 mL/min (ref >60)
GFR, Estimated: 58 mL/min — ABNORMAL LOW (ref >60)
Glucose, Bld: 145 mg/dL — ABNORMAL HIGH (ref 70–99)
Potassium: 4.7 mmol/L (ref 3.5–5.1)
Sodium: 139 mmol/L (ref 135–145)
Total Bilirubin: 1.9 mg/dL — ABNORMAL HIGH (ref 0.3–1.2)
Total Protein: 6.6 g/dL (ref 6.5–8.1)

## 2019-03-25 MED ORDER — IOHEXOL 300 MG/ML  SOLN
75.0000 mL | Freq: Once | INTRAMUSCULAR | Status: AC | PRN
  Administered 2019-03-25: 75 mL via INTRAVENOUS

## 2019-03-25 MED ORDER — SODIUM CHLORIDE (PF) 0.9 % IJ SOLN
INTRAMUSCULAR | Status: AC
  Filled 2019-03-25: qty 50

## 2019-03-26 ENCOUNTER — Telehealth: Payer: Self-pay | Admitting: Internal Medicine

## 2019-03-26 ENCOUNTER — Ambulatory Visit: Payer: Medicare Other | Admitting: Internal Medicine

## 2019-03-26 ENCOUNTER — Inpatient Hospital Stay (HOSPITAL_BASED_OUTPATIENT_CLINIC_OR_DEPARTMENT_OTHER): Payer: Medicare Other | Admitting: Internal Medicine

## 2019-03-26 ENCOUNTER — Other Ambulatory Visit: Payer: Self-pay

## 2019-03-26 ENCOUNTER — Encounter: Payer: Self-pay | Admitting: Internal Medicine

## 2019-03-26 VITALS — BP 97/50 | HR 76 | Temp 98.5°F | Resp 16 | Ht 73.0 in | Wt 160.1 lb

## 2019-03-26 DIAGNOSIS — I10 Essential (primary) hypertension: Secondary | ICD-10-CM | POA: Diagnosis not present

## 2019-03-26 DIAGNOSIS — I252 Old myocardial infarction: Secondary | ICD-10-CM | POA: Diagnosis not present

## 2019-03-26 DIAGNOSIS — R590 Localized enlarged lymph nodes: Secondary | ICD-10-CM | POA: Diagnosis not present

## 2019-03-26 DIAGNOSIS — E119 Type 2 diabetes mellitus without complications: Secondary | ICD-10-CM | POA: Diagnosis not present

## 2019-03-26 DIAGNOSIS — Z85118 Personal history of other malignant neoplasm of bronchus and lung: Secondary | ICD-10-CM | POA: Diagnosis not present

## 2019-03-26 DIAGNOSIS — C3490 Malignant neoplasm of unspecified part of unspecified bronchus or lung: Secondary | ICD-10-CM

## 2019-03-26 DIAGNOSIS — E785 Hyperlipidemia, unspecified: Secondary | ICD-10-CM | POA: Diagnosis not present

## 2019-03-26 NOTE — Progress Notes (Signed)
[facility] [phone]   Fax:([phone]) [phone]  OFFICE PROGRESS NOTE  [name], NP [place] [address]  DIAGNOSIS: Stage IA (T1a, N0, M0) non-small cell lung cancer, well-differentiated adenocarcinoma of the left upper lobe diagnosed in February of 2015   PRIOR THERAPY: Status post wedge resection of the left upper lobe under the care of Dr. Roxy Manns on 11/29/2013.  CURRENT THERAPY: Observation.  INTERVAL HISTORY: [name] 73 y.o. male returns to the clinic today for 3 months follow-up visit.  The patient is feeling fine today with no concerning complaints except for the baseline shortness of breath and he is currently on home oxygen.  He denied having any current chest pain, cough or hemoptysis.  He denied having any fever or chills.  He has no nausea, vomiting, diarrhea or constipation.  He has no headache or visual changes.  He was found on previous CT scan of the chest to have enlarged mediastinal lymph node.  The patient had a recent CT scan of the chest for further evaluation of this lymphadenopathy.  He is here today for discussion of his scan results.  MEDICAL HISTORY: Past Medical History:  Diagnosis Date  . Atrial fibrillation ([place])    AV Node ablation January, 2010, for rapid atrial fib  . Atrial septal defect    Closed with surgery January, 2010  . Automatic implantable cardioverter-defibrillator in situ    LV dysfunction and pacer needed for AV node lesion  . Cardiomyopathy    non-ischemic  . CHF (congestive heart failure) ([place])   . Chronic combined systolic and diastolic CHF (congestive heart failure) ([place])   . Colon polyps   . COPD (chronic obstructive pulmonary disease) (HCC)    O2- 2 liters, nasal cannula, q night   . COPD GOLD II 01/11/2007   PFT's 09/25/13  FEV1  2.18 (61%) ratio 64 no change p B2 and DLC0  32% corrects to 68% - trial off advair and acei rec starting  08/23/2013     . CVA (cerebral  vascular accident) ([place]) 2009   denies residual on 08/14/2013  . Diabetes mellitus without complication ([place])    type 2  . Dyslipidemia   . Dysrhythmia   . Ejection fraction < 50%   . Endocarditis    Bacterial, 2009  . Headache(784.0)    related to stroke only  . HLD (hyperlipidemia)   . Hypertension   . Intracranial hemorrhage (HCC)    Coumadin cannot be used because of the history of his bleed  . Lung cancer ([place]) 11/29/2013   T1N0 Stage Ia non-small cell carcinoma left lung treated with wedge resection  . Mitral regurgitation    Severe symptomatic primary MR due to bacterial endocarditis, treated w/ MVR // Echo 1/18 EF 40-45, diffuse HK, dilated aorta at 42 mm/aortic root 47 mm; linear echogenic structure in ascending aorta - suspect reverberation artifact / consider CT or TEE to rule out dissection flap, bioprosthetic MVR with mean gradient 3, severe LAE, low normal RVSF, severe RAE, moderate TR, PASP 42  . Myocardial infarction ([place]) 2010  . Pacemaker    combo pacer and icd  . Permanent atrial fibrillation    Originally Coumadin use for atrial fibrillation  //   he had intracerebral hemorrhage with an INR of 2.3 June, 2009. Anticoagulation could no longer be used.  //  Rapid atrial fibrillation after inferior MI October, 2010..........Marland Kitchen AV node ablation done at that time  with ICD pacemaker placed (EF 35%).   //   Left atrial appendage tied off at the time of mitral valve surgery January, 2010 (maze pro  . Pneumonia 07/2018  . Prosthetic valve dysfunction    Mild mitral stenosis  . Pulmonary hypertension (HCC)    Moderate  . Renal artery stenosis (HCC)    Mild by history  . 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 Spontaneous pneumothorax    right thoracotomy - distant past  . Status post minimally invasive mitral valve replacement with bioprosthetic valve    33 mm Medtronic Mosaic porcine bioprosthesis placed via right mini thoracotomy for bacterial endocarditis complicated by  severe MR and CHF   . Thoracic aortic aneurysm ([place]) 08/11/2016   a - Chest CTA 1/18:  Aneurysmal dilatation of aortic root is noted at 5.1 cm.     ALLERGIES:  is allergic to anticoagulant compound; other; and warfarin sodium.  MEDICATIONS:  Current Outpatient Medications  Medication Sig Dispense Refill  . amoxicillin (AMOXIL) 500 MG capsule Take 2,000 mg by mouth See admin instructions. Take 2,000 mg by mouth one hour prior to dental procedures    . aspirin EC 81 MG tablet Take 1 tablet (81 mg total) by mouth daily. 90 tablet 3  . atorvastatin (LIPITOR) 10 MG tablet TAKE 1 TABLET BY MOUTH EVERY DAY 90 tablet 3  . budesonide-formoterol (SYMBICORT) 160-4.5 MCG/ACT inhaler INHALE 2 PUFFS INTO THE LUNGS TWICE A DAY 10.2 Inhaler 6  . carvedilol (COREG) 3.125 MG tablet TAKE 1 TABLET BY MOUTH TWICE A DAY WITH MEALS 180 tablet 0  . cetirizine (ZYRTEC) 10 MG tablet Take 10 mg by mouth daily.    . furosemide (LASIX) 40 MG tablet TAKE 1 TABLET BY MOUTH EVERY DAY 90 tablet 3  . metFORMIN (GLUCOPHAGE) 1000 MG tablet TAKE 1 TABLET BY MOUTH TWICE A DAY WITH MEALS 180 tablet 0  . nitroGLYCERIN (NITROSTAT) 0.4 MG SL tablet Place 0.4 mg under the tongue every 5 (five) minutes x 3 doses as needed for chest pain.     Marland Kitchen Olopatadine HCl (PAZEO) 0.7 % SOLN Apply 1 drop to eye daily. 1 Bottle 0  . OXYGEN Inhale 2 L into the lungs continuous.     . valsartan (DIOVAN) 40 MG tablet TAKE 1 TABLET BY MOUTH EVERY DAY 90 tablet 2   No current facility-administered medications for this visit.     SURGICAL HISTORY:  Past Surgical History:  Procedure Laterality Date  . APPENDECTOMY    . ASD REPAIR, SECUNDUM  07/17/2008   pericardial patch closure of ASD  . AV NODE ABLATION  07/2008   for rapid atrial fib  . CARDIAC CATHETERIZATION    . CARDIAC DEFIBRILLATOR PLACEMENT  ~ [address] [place]  . CARDIAC VALVE REPLACEMENT    . CATARACT EXTRACTION W/ INTRAOCULAR LENS  IMPLANT, BILATERAL Bilateral   .  ESOPHAGOGASTRODUODENOSCOPY (EGD) WITH PROPOFOL N/A 09/29/2016   Procedure: ESOPHAGOGASTRODUODENOSCOPY (EGD) WITH PROPOFOL;  Surgeon: [name], MD;  Location: [facility] ENDOSCOPY;  Service: Endoscopy;  Laterality: N/A;  . HERNIA REPAIR    . IMPLANTABLE CARDIOVERTER DEFIBRILLATOR (ICD) GENERATOR CHANGE N/A 02/06/2012   Procedure: ICD GENERATOR CHANGE;  Surgeon: [name], MD;  Location: [facility] CATH LAB;  Service: Cardiovascular;  Laterality: N/A;  . INSERT / REPLACE / REMOVE PACEMAKER    . MASS BIOPSY Left    neck mass  . MITRAL VALVE REPLACEMENT Right 07/17/2008   53mm Medtronic Mosaic porcine bioprosthesis  .  PENILE PROSTHESIS IMPLANT    . RIGHT HEART CATHETERIZATION N/A 08/16/2013   Procedure: RIGHT HEART CATH;  Surgeon: Josue Hector, MD;  Location: [facility] CATH LAB;  Service: Cardiovascular;  Laterality: N/A;  . TEE WITHOUT CARDIOVERSION N/A 09/06/2016   Procedure: TRANSESOPHAGEAL ECHOCARDIOGRAM (TEE);  Surgeon: Dorothy Spark, MD;  Location: [facility] ENDOSCOPY;  Service: Cardiovascular;  Laterality: N/A;  . THORACOTOMY Right 1970's   spontaneous pneumothorax - while in the [place]  . TOE SURGERY     left foot hammer toe  . TONSILLECTOMY    . VIDEO ASSISTED THORACOSCOPY (VATS)/WEDGE RESECTION Left 11/29/2013   Procedure: Video assisted thoracoscopy for wedge resection; mini thoracotomy;  Surgeon: [name], MD;  Location: [facility];  Service: Thoracic;  Laterality: Left;    REVIEW OF SYSTEMS:  A comprehensive review of systems was negative except for: Respiratory: positive for dyspnea on exertion   PHYSICAL EXAMINATION: General appearance: alert, cooperative, fatigued and no distress Head: Normocephalic, without obvious abnormality, atraumatic Neck: no adenopathy, no JVD, supple, symmetrical, trachea midline and thyroid not enlarged, symmetric, no tenderness/mass/nodules Lymph nodes: Cervical, supraclavicular, and axillary nodes normal. Resp: clear to auscultation bilaterally Back: negative,  symmetric, no curvature. ROM normal. No CVA tenderness. Cardio: regular rate and rhythm, S1, S2 normal, no murmur, click, rub or gallop GI: soft, non-tender; bowel sounds normal; no masses,  no organomegaly Extremities: extremities normal, atraumatic, no cyanosis or edema  ECOG PERFORMANCE STATUS: 1 - Symptomatic but completely ambulatory  Blood pressure (!) 97/50, pulse 76, temperature 98.5 F (36.9 C), resp. rate 16, height 6\' 1"  (1.854 m), weight 160 lb 1.6 oz (72.6 kg), SpO2 94 %.  LABORATORY DATA: Lab Results  Component Value Date   WBC 8.0 03/25/2019   HGB 9.8 (L) 03/25/2019   HCT 33.3 (L) 03/25/2019   MCV 80.4 03/25/2019   PLT 259 03/25/2019      Chemistry      Component Value Date/Time   NA 139 03/25/2019 1359   NA 140 12/20/2016 0746   K 4.7 03/25/2019 1359   K 5.1 12/20/2016 0746   CL 102 03/25/2019 1359   CO2 25 03/25/2019 1359   CO2 29 12/20/2016 0746   BUN 26 (H) 03/25/2019 1359   BUN 20.6 12/20/2016 0746   CREATININE 1.24 03/25/2019 1359   CREATININE 1.2 12/20/2016 0746      Component Value Date/Time   CALCIUM 9.0 03/25/2019 1359   CALCIUM 10.0 12/20/2016 0746   ALKPHOS 62 03/25/2019 1359   ALKPHOS 64 12/20/2016 0746   AST 9 (L) 03/25/2019 1359   AST 11 12/20/2016 0746   ALT 6 03/25/2019 1359   ALT 8 12/20/2016 0746   BILITOT 1.9 (H) 03/25/2019 1359   BILITOT 3.01 (H) 12/20/2016 0746       RADIOGRAPHIC STUDIES: Ct Chest W Contrast  Result Date: 03/25/2019 CLINICAL DATA:  Lung cancer staging, stage IA left upper lobe status post wedge resection EXAM: CT CHEST WITH CONTRAST TECHNIQUE: Multidetector CT imaging of the chest was performed during intravenous contrast administration. CONTRAST:  54mL OMNIPAQUE IOHEXOL 300 MG/ML  SOLN COMPARISON:  12/21/2018, 12/22/2017 FINDINGS: Cardiovascular: Scattered aortic atherosclerosis. Gross cardiomegaly with severe enlargement of the atria. Three-vessel coronary artery calcifications. Right chest multi lead pacer  defibrillator. No pericardial effusion. Enlargement of the pulmonary arteries to 3.7 cm. Mediastinum/Nodes: Redemonstrated enlargement of numerous mediastinal and hilar lymph nodes, the largest left hilar node measuring 1.5 cm in short axis (series 2, image 62) and subcarinal nodes measuring 2.0 cm  in short axis (series 2, image 25). Thyroid gland, trachea, and esophagus demonstrate no significant findings. Lungs/Pleura: Severe emphysema. Status post left upper lobe wedge resection. Dependent bibasilar scarring with numerous tiny benign calcified nodules of the dependent lungs. Very small, loculated right pleural effusion, new compared to prior examination. Upper Abdomen: No acute abnormality. Musculoskeletal: No chest wall mass or suspicious bone lesions identified. Unchanged superior endplate deformity of U27. IMPRESSION: 1. Redemonstrated postoperative findings of left upper lobe wedge resection. No evidence of local malignant recurrence. 2. Redemonstrated enlargement of numerous mediastinal and hilar lymph nodes, the largest left hilar node measuring 1.5 cm in short axis (series 2, image 62) and subcarinal nodes measuring 2.0 cm in short axis (series 2, image 25). These are not significantly changed compared to recent CT dated 12/21/2018 although are slightly enlarged in comparison to examinations dating back to 12/22/2017. 3. Very small, loculated right pleural effusion, new compared to prior examination. 4.  Cardiomegaly and coronary artery disease. 5. Aortic Atherosclerosis (ICD10-I70.0) and Emphysema (ICD10-J43.9). Electronically Signed   By: Eddie Candle M.D.   On: 03/25/2019 15:55    ASSESSMENT AND PLAN:  This is a very pleasant 73 years old white male with a stage IA non-small cell lung cancer status post wedge resection of the left upper lobe in 2015. The patient has been on observation since that time. The patient had repeat CT scan of the chest performed recently.  I personally and independently  reviewed the scan images and discussed the results with the patient today. He has no concerning findings for disease progression but there was stable enlarged mediastinal lymphadenopathy. I recommended for the patient to continue on observation with repeat CT scan of the chest in 4 months for further evaluation of the mediastinal lymphadenopathy and to rule out any disease recurrence. For the hypertension, the patient is currently on several blood pressure medications.  I recommended for him to discuss with his primary care physician and cardiologist adjustment of his medication. He was advised to call immediately if he has any concerning symptoms in the interval. The patient voices understanding of current disease status and treatment options and is in agreement with the current care plan.  All questions were answered. The patient knows to call the clinic with any problems, questions or concerns. We can certainly see the patient much sooner if necessary.  Disclaimer: This note was dictated with voice recognition software. Similar sounding words can inadvertently be transcribed and may not be corrected upon review.

## 2019-03-26 NOTE — Telephone Encounter (Signed)
Scheduled per 09/15 los, patient received after visit summary and calender.

## 2019-03-27 ENCOUNTER — Other Ambulatory Visit: Payer: Self-pay | Admitting: Adult Health

## 2019-03-27 DIAGNOSIS — I10 Essential (primary) hypertension: Secondary | ICD-10-CM

## 2019-03-27 DIAGNOSIS — E119 Type 2 diabetes mellitus without complications: Secondary | ICD-10-CM

## 2019-04-05 ENCOUNTER — Encounter: Payer: Medicare Other | Admitting: Adult Health

## 2019-04-05 NOTE — Progress Notes (Deleted)
   Subjective:    Patient ID: [name], male    DOB: 03-17-1946, 73 y.o.   MRN: [id number]  HPI Patient presents for yearly preventative medicine examination. He is a pleasant 73 year old male who  has a past medical history of Atrial fibrillation ([place]), Atrial septal defect, Automatic implantable cardioverter-defibrillator in situ, Cardiomyopathy, CHF (congestive heart failure) ([place]), Chronic combined systolic and diastolic CHF (congestive heart failure) ([place]), Colon polyps, COPD (chronic obstructive pulmonary disease) ([place]), COPD GOLD II (01/11/2007), CVA (cerebral vascular accident) ([place]) (2009), Diabetes mellitus without complication ([place]), Dyslipidemia, Dysrhythmia, Ejection fraction < 50%, Endocarditis, Headache(784.0), HLD (hyperlipidemia), Hypertension, Intracranial hemorrhage ([place]), Lung cancer ([place]) (11/29/2013), Mitral regurgitation, Myocardial infarction ([place]) (2010), Pacemaker, Permanent atrial fibrillation, Pneumonia (07/2018), Prosthetic valve dysfunction, Pulmonary hypertension ([place]), Renal artery stenosis (HCC), Sinus of Valsalva aneurysm (08/26/2016), Spontaneous pneumothorax, Status post minimally invasive mitral valve replacement with bioprosthetic valve, and Thoracic aortic aneurysm ([place]) (08/11/2016).    All immunizations and health maintenance protocols were reviewed with the patient and needed orders were placed.  Appropriate screening laboratory values were ordered for the patient including screening of hyperlipidemia, renal function and hepatic function. If indicated by BPH, a PSA was ordered.  Medication reconciliation,  past medical history, social history, problem list and allergies were reviewed in detail with the patient  Goals were established with regard to weight loss, exercise, and  diet in compliance with medications  End of life planning was discussed.     Review of Systems     Objective:   Physical Exam        Assessment & Plan:

## 2019-04-06 ENCOUNTER — Other Ambulatory Visit: Payer: Self-pay | Admitting: Adult Health

## 2019-04-09 ENCOUNTER — Telehealth: Payer: Self-pay | Admitting: Pulmonary Disease

## 2019-04-09 NOTE — Telephone Encounter (Signed)
Called and spoke with Patient.  Patient stated he was contacted by [employer].  Patient stated he needed a new prescription to continue getting his O2.  Patient is concerned he will not be able to get his O2 anymore.  Patient was scheduled 05/10/19, with Brian,NP for 1 year follow up. OV moved to 04/11/19 with Brian,NP.  Patient is aware of office new location.  Nothing further at this time.

## 2019-04-09 NOTE — Telephone Encounter (Signed)
Sent to the pharmacy by e-scribe.  Pt has upcoming cpx. 

## 2019-04-10 NOTE — Progress Notes (Signed)
@Patient  ID: [name], male    DOB: [birth year], 73 y.o.   MRN: [id number]  Chief Complaint  Patient presents with   Follow-up    F/U for COPD and O2. States his breathing has been ok since last visit. Needs order to be sent to [employer].     Referring provider: [name], NP  HPI:  73 year old male current every day smoker followed in our office for COPD  PMH: Cardiomyopathy, bioprosthetic mitral valve, pulmonary hypertension, left VATS with wedge resection of left upper lobe by Dr. Roxy Manns (11/2013-well-differentiated adenocarcinoma) Smoker/ Smoking History: Former smoker.  40-pack-year smoking history.  Quit 2015. Maintenance:  Symbicort 160 Pt of: Dr. Elsworth Soho   04/11/2019  - Visit   73 year old male former smoker presenting to our office today as a one-year follow-up visit.  Patient also reporting he needs to be requalified for a concentrator at home.  His DME is [employer] [facility].  His old concentrator that he had at home broke needed to be replaced after 5 years.  He already has obtained the new concentrator but needs a prescription from our office so that way he does not have to pay for everything out of pocket.  Patient reports he still maintained on Symbicort 160 and is doing well.  He reports no new or acute concerns today.  On arrival to our office patient was walked.  Patient qualified for oxygen 3 L with exertion continuous.  Patient also needs to be maintained on 2 L of O2 at night.   Questionaires / Pulmonary Flowsheets:   MMRC: mMRC Dyspnea Scale mMRC Score  04/11/2019 0    Tests:   TEE 08/2016 EF 45%  PFTs 09/2013 - FEV1 2.18- 61%, ratio 64, FVC 3.40 -71%, DLCO 11.0- 32%  CPET 11/20/13 FEV1 2.28 -58%, MVV 105 (71%), peak V O2 12.7, moderate-to-severe functional limitation due primarily to a circulatory limitation   PFTs 08/2016 ratio 60, FEV1 52%, FVC 64%, DLCO 23%  FENO:  No results found for: NITRICOXIDE  PFT: PFT Results Latest Ref Rng & Units 08/29/2016  09/25/2013  FVC-Pre L 2.98 3.40  FVC-Predicted Pre % 64 71  FVC-Post L 3.14 3.65  FVC-Predicted Post % 67 77  Pre FEV1/FVC % % 60 64  Post FEV1/FCV % % 64 65  FEV1-Pre L 1.78 2.18  FEV1-Predicted Pre % 52 61  FEV1-Post L 2.00 2.37  DLCO UNC% % 23 32  DLCO COR %Predicted % 40 47  TLC L 7.21 5.61  TLC % Predicted % 99 77  RV % Predicted % 168 83    WALK:  SIX MIN WALK 04/11/2019 12/16/2015 04/18/2014  Supplimental Oxygen during Test? (L/min) Yes No -  O2 Flow Rate 3 - -  Type Continuous - -  Tech Comments: Patient was able to complete 1/2 lap before his O2 dropped to 80%. Patient was placed on 2L of O2, O2 only increased to 82%. Patient was then placed on 3L, O2 increased to 91%. Patient denied any chest pain or SOB during or after walk. placed on 2L - 91% on 2L No complaints during walk; Lowest SAT 85%    Imaging: Ct Chest W Contrast  Result Date: 03/25/2019 CLINICAL DATA:  Lung cancer staging, stage IA left upper lobe status post wedge resection EXAM: CT CHEST WITH CONTRAST TECHNIQUE: Multidetector CT imaging of the chest was performed during intravenous contrast administration. CONTRAST:  54mL OMNIPAQUE IOHEXOL 300 MG/ML  SOLN COMPARISON:  12/21/2018, 12/22/2017 FINDINGS: Cardiovascular: Scattered aortic atherosclerosis. Johney Maine  cardiomegaly with severe enlargement of the atria. Three-vessel coronary artery calcifications. Right chest multi lead pacer defibrillator. No pericardial effusion. Enlargement of the pulmonary arteries to 3.7 cm. Mediastinum/Nodes: Redemonstrated enlargement of numerous mediastinal and hilar lymph nodes, the largest left hilar node measuring 1.5 cm in short axis (series 2, image 62) and subcarinal nodes measuring 2.0 cm in short axis (series 2, image 25). Thyroid gland, trachea, and esophagus demonstrate no significant findings. Lungs/Pleura: Severe emphysema. Status post left upper lobe wedge resection. Dependent bibasilar scarring with numerous tiny benign calcified  nodules of the dependent lungs. Very small, loculated right pleural effusion, new compared to prior examination. Upper Abdomen: No acute abnormality. Musculoskeletal: No chest wall mass or suspicious bone lesions identified. Unchanged superior endplate deformity of N39. IMPRESSION: 1. Redemonstrated postoperative findings of left upper lobe wedge resection. No evidence of local malignant recurrence. 2. Redemonstrated enlargement of numerous mediastinal and hilar lymph nodes, the largest left hilar node measuring 1.5 cm in short axis (series 2, image 62) and subcarinal nodes measuring 2.0 cm in short axis (series 2, image 25). These are not significantly changed compared to recent CT dated 12/21/2018 although are slightly enlarged in comparison to examinations dating back to 12/22/2017. 3. Very small, loculated right pleural effusion, new compared to prior examination. 4.  Cardiomegaly and coronary artery disease. 5. Aortic Atherosclerosis (ICD10-I70.0) and Emphysema (ICD10-J43.9). Electronically Signed   By: Eddie Candle M.D.   On: 03/25/2019 15:55    Lab Results:  CBC    Component Value Date/Time   WBC 8.0 03/25/2019 1359   WBC 6.3 08/27/2018 1059   RBC 4.14 (L) 03/25/2019 1359   HGB 9.8 (L) 03/25/2019 1359   HGB 15.6 12/20/2016 0746   HCT 33.3 (L) 03/25/2019 1359   HCT 49.3 12/20/2016 0746   PLT 259 03/25/2019 1359   PLT 151 12/20/2016 0746   MCV 80.4 03/25/2019 1359   MCV 96.5 12/20/2016 0746   MCH 23.7 (L) 03/25/2019 1359   MCHC 29.4 (L) 03/25/2019 1359   RDW 17.7 (H) 03/25/2019 1359   RDW 15.2 (H) 12/20/2016 0746   LYMPHSABS 0.7 03/25/2019 1359   LYMPHSABS 0.8 (L) 12/20/2016 0746   MONOABS 0.8 03/25/2019 1359   MONOABS 0.7 12/20/2016 0746   EOSABS 0.2 03/25/2019 1359   EOSABS 0.2 12/20/2016 0746   BASOSABS 0.1 03/25/2019 1359   BASOSABS 0.0 12/20/2016 0746    BMET    Component Value Date/Time   NA 139 03/25/2019 1359   NA 140 12/20/2016 0746   K 4.7 03/25/2019 1359   K 5.1  12/20/2016 0746   CL 102 03/25/2019 1359   CO2 25 03/25/2019 1359   CO2 29 12/20/2016 0746   GLUCOSE 145 (H) 03/25/2019 1359   GLUCOSE 166 (H) 12/20/2016 0746   GLUCOSE 100 (H) 07/19/2006 1101   BUN 26 (H) 03/25/2019 1359   BUN 20.6 12/20/2016 0746   CREATININE 1.24 03/25/2019 1359   CREATININE 1.2 12/20/2016 0746   CALCIUM 9.0 03/25/2019 1359   CALCIUM 10.0 12/20/2016 0746   GFRNONAA 58 (L) 03/25/2019 1359   GFRAA >60 03/25/2019 1359    BNP    Component Value Date/Time   BNP 437.2 (H) 07/30/2018 1330    ProBNP    Component Value Date/Time   PROBNP 1,738.0 (H) 08/15/2013 1405    Specialty Problems      Pulmonary Problems   COPD GOLD II    PFT's 09/25/13  FEV1  2.18 (61%) ratio 64 no change p B2 and  DLC0  32% corrects to 68% -(pre-op)    PFTs 08/2016 ratio 60, FEV1 52%, FVC 64%, DLCO 23%      Primary cancer of left upper lobe of lung ([place])    Qualifier: Diagnosis of  By: Sherren Mocha MD, Dellis Filbert A       Chronic respiratory failure assoc with chf/ PAH    Significant hypoxia when seen in the office and admitted to the hospital August 12, 2013 - rx  2lpm exertion & sleep       Community acquired pneumonia   Acute on chronic respiratory failure ([place])   Lung cancer ([place])    T1N0 Stage Ia non-small cell carcinoma left lung treated with wedge resection      Pulmonary edema   COPD with acute exacerbation (HCC)      Allergies  Allergen Reactions   Anticoagulant Compound Other (See Comments)    Pt had intracranial bleed, therefore all anticoagulation is contraindicated per Dr. Ron Parker   Other Other (See Comments)    Per Dr. Halford Chessman (Surgeon): stated that the patient cannot be put under for any surgery, as he has an enlarged aorta. He would stand only a 50/50 chance of surviving. He has lung issues, diminished lung tissue, COPD, and emphysema.   Warfarin Sodium Other (See Comments)    Pt had intracranial bleed, therefore all anticoagulation is contraindicated per  Dr. Ron Parker    Immunization History  Administered Date(s) Administered   Hep A / Hep B 09/25/2014, 10/02/2014, 10/24/2014, 10/02/2015   Influenza Split 03/31/2011, 04/02/2012   Influenza Whole 07/11/2000, 04/01/2009, 04/12/2010   Influenza, High Dose Seasonal PF 05/01/2015, 04/26/2017, 04/06/2018   Influenza,inj,Quad PF,6+ Mos 04/04/2013, 04/18/2014   Influenza-Unspecified 03/23/2016   Pneumococcal Conjugate-13 08/05/2013   Pneumococcal Polysaccharide-23 07/11/2000, 04/01/2009, 04/04/2013   Td 07/11/2000   Tdap 08/03/2011    Past Medical History:  Diagnosis Date   Atrial fibrillation ([place])    AV Node ablation January, 2010, for rapid atrial fib   Atrial septal defect    Closed with surgery January, 2010   Automatic implantable cardioverter-defibrillator in situ    LV dysfunction and pacer needed for AV node lesion   Cardiomyopathy    non-ischemic   CHF (congestive heart failure) (HCC)    Chronic combined systolic and diastolic CHF (congestive heart failure) (HCC)    Colon polyps    COPD (chronic obstructive pulmonary disease) (HCC)    O2- 2 liters, nasal cannula, q night    COPD GOLD II 01/11/2007   PFT's 09/25/13  FEV1  2.18 (61%) ratio 64 no change p B2 and DLC0  32% corrects to 68% - trial off advair and acei rec starting  08/23/2013      CVA (cerebral vascular accident) ([place]) 2009   denies residual on 08/14/2013   Diabetes mellitus without complication ([place])    type 2   Dyslipidemia    Dysrhythmia    Ejection fraction < 50%    Endocarditis    Bacterial, 2009   Headache(784.0)    related to stroke only   HLD (hyperlipidemia)    Hypertension    Intracranial hemorrhage (HCC)    Coumadin cannot be used because of the history of his bleed   Lung cancer ([place]) 11/29/2013   T1N0 Stage Ia non-small cell carcinoma left lung treated with wedge resection   Mitral regurgitation    Severe symptomatic primary MR due to bacterial endocarditis, treated w/  MVR // Echo 1/18 EF 40-45, diffuse HK, dilated aorta at  42 mm/aortic root 47 mm; linear echogenic structure in ascending aorta - suspect reverberation artifact / consider CT or TEE to rule out dissection flap, bioprosthetic MVR with mean gradient 3, severe LAE, low normal RVSF, severe RAE, moderate TR, PASP 42   Myocardial infarction [facility]) 2010   Pacemaker    combo pacer and icd   Permanent atrial fibrillation    Originally Coumadin use for atrial fibrillation  //   he had intracerebral hemorrhage with an INR of 2.3 June, 2009. Anticoagulation could no longer be used.  //  Rapid atrial fibrillation after inferior MI October, 2010..........Marland Kitchen AV node ablation done at that time with ICD pacemaker placed (EF 35%).   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     Tobacco History: Social History   Tobacco Use  Smoking Status Former Smoker   Packs/day: 1.00   Years: 45.00   Pack years: 45.00   Types: Cigarettes   Quit date: 08/11/2013   Years since quitting: 5.6  Smokeless Tobacco Never Used  Tobacco Comment   08/14/2013 "quit smoking in 2009"   Counseling given: Yes Comment: 08/14/2013 "quit smoking in 2009"   Continue to not smoke  Outpatient Encounter Medications as of 04/11/2019  Medication Sig    amoxicillin (AMOXIL) 500 MG capsule Take 2,000 mg by mouth See admin instructions. Take 2,000 mg by mouth one hour prior to dental procedures   aspirin EC 81 MG tablet Take 1 tablet (81 mg total) by mouth daily.   atorvastatin (LIPITOR) 10 MG tablet TAKE 1 TABLET BY MOUTH EVERY DAY   budesonide-formoterol (SYMBICORT) 160-4.5 MCG/ACT inhaler INHALE 2 PUFFS INTO THE LUNGS TWICE A DAY   carvedilol (COREG) 3.125 MG tablet TAKE 1 TABLET BY MOUTH TWICE A DAY WITH MEALS   cetirizine (ZYRTEC) 10 MG tablet Take 10 mg by mouth daily.   furosemide (LASIX) 40 MG tablet TAKE 1 TABLET BY MOUTH EVERY DAY   metFORMIN (GLUCOPHAGE) 1000 MG tablet TAKE 1 TABLET BY MOUTH TWICE A DAY WITH MEALS   nitroGLYCERIN (NITROSTAT) 0.4 MG SL tablet Place 0.4 mg under the tongue every 5 (five) minutes x 3 doses as needed for chest pain.    Olopatadine HCl (PAZEO) 0.7 % SOLN Apply 1 drop to eye daily.   OXYGEN Inhale 2 L into the lungs continuous.    valsartan (DIOVAN) 40 MG tablet TAKE 1 TABLET BY MOUTH EVERY DAY   budesonide-formoterol (SYMBICORT) 160-4.5 MCG/ACT inhaler Inhale 2 puffs into the lungs 2 (two) times daily.   No facility-administered encounter medications on file as of 04/11/2019.      Review of Systems  Review of Systems  Constitutional: Negative for activity change, chills, fatigue, fever and unexpected weight change.  HENT: Negative for postnasal drip,  rhinorrhea, sinus pressure, sinus pain and sore throat.   Eyes: Negative.   Respiratory: Negative for cough, shortness of breath and wheezing.   Cardiovascular: Negative for chest pain and palpitations.  Gastrointestinal: Negative for constipation, diarrhea, nausea and vomiting.  Endocrine: Negative.   Genitourinary: Negative.   Musculoskeletal: Negative.   Skin: Negative.   Neurological: Negative for dizziness and headaches.  Psychiatric/Behavioral: Negative.  Negative for dysphoric mood. The patient is not nervous/anxious.   All other  systems reviewed and are negative.    Physical Exam  BP 118/74    Pulse 71    Temp 97.7 F (36.5 C) (Temporal)    Ht 6\' 1"  (1.854 m)    Wt 161 lb (73 kg)    SpO2 98%    BMI 21.24 kg/m   Wt Readings from Last 5 Encounters:  04/11/19 161 lb (73 kg)  03/26/19 160 lb 1.6 oz (72.6 kg)  03/12/19 158 lb 3.2 oz (71.8 kg)  12/31/18 163 lb (73.9 kg)  12/24/18 163 lb (73.9 kg)    BMI Readings from Last 5 Encounters:  04/11/19 21.24 kg/m  03/26/19 21.12 kg/m  03/12/19 20.87 kg/m  12/31/18 21.51 kg/m  12/24/18 21.51 kg/m     Physical Exam Vitals signs and nursing note reviewed.  Constitutional:      General: He is not in acute distress.    Appearance: Normal appearance. He is obese.  HENT:     Head: Normocephalic and atraumatic.     Right Ear: Hearing, tympanic membrane, ear canal and external ear normal.     Left Ear: Hearing, tympanic membrane, ear canal and external ear normal.     Ears:     Comments: Very hard of hearing    Nose: Nose normal. No mucosal edema or rhinorrhea.     Right Turbinates: Not enlarged.     Left Turbinates: Not enlarged.     Mouth/Throat:     Mouth: Mucous membranes are dry.     Pharynx: Oropharynx is clear. No oropharyngeal exudate.  Eyes:     Pupils: Pupils are equal, round, and reactive to light.  Neck:     Musculoskeletal: Normal range of motion.  Cardiovascular:     Rate and Rhythm: Normal rate and regular rhythm.     Pulses: Normal pulses.     Heart sounds: Normal heart sounds. No murmur.  Pulmonary:     Effort: Pulmonary effort is normal.     Breath sounds: Normal breath sounds. No decreased breath sounds, wheezing or rales.  Abdominal:     Palpations: Abdomen is soft.  Musculoskeletal:     Right lower leg: No edema.     Left lower leg: No edema.  Lymphadenopathy:     Cervical: No cervical adenopathy.  Skin:    General: Skin is warm and dry.     Capillary Refill: Capillary refill takes less than 2 seconds.     Findings: No  erythema or rash.  Neurological:     General: No focal deficit present.     Mental Status: He is alert and oriented to person, place, and time.     Motor: No weakness.     Coordination: Coordination normal.     Gait: Gait is intact. Gait (Tolerated walk in office, 3 laps, required 3 L of O2) normal.  Psychiatric:        Mood and Affect: Mood normal.        Behavior: Behavior normal. Behavior is cooperative.  Thought Content: Thought content normal.        Judgment: Judgment normal.       Assessment & Plan:   Chronic respiratory failure assoc with chf/ PAH Plan: Continue oxygen therapy as prescribed-3 L with physical exertion, 2 L with sleep We will send an order to your DME company to ensure that you can be maintained on this dose  COPD GOLD II Plan: Continue Symbicort 160, sample provided today [facility] for COPD and symptom management telephonic outreach Follow-up with our office in 6 to 12 months    Return in about 6 months (around 10/10/2019), or if symptoms worsen or fail to improve, for Follow up with Dr. Elsworth Soho.   Lauraine Rinne, NP 04/11/2019   This appointment was 28 minutes long with over 50% of the time in direct face-to-face patient care, assessment, plan of care, and follow-up.

## 2019-04-11 ENCOUNTER — Encounter: Payer: Self-pay | Admitting: Pulmonary Disease

## 2019-04-11 ENCOUNTER — Other Ambulatory Visit: Payer: Self-pay

## 2019-04-11 ENCOUNTER — Ambulatory Visit (INDEPENDENT_AMBULATORY_CARE_PROVIDER_SITE_OTHER): Payer: Medicare Other | Admitting: Pulmonary Disease

## 2019-04-11 VITALS — BP 118/74 | HR 71 | Temp 97.7°F | Ht 73.0 in | Wt 161.0 lb

## 2019-04-11 DIAGNOSIS — J449 Chronic obstructive pulmonary disease, unspecified: Secondary | ICD-10-CM

## 2019-04-11 DIAGNOSIS — J9611 Chronic respiratory failure with hypoxia: Secondary | ICD-10-CM

## 2019-04-11 MED ORDER — BUDESONIDE-FORMOTEROL FUMARATE 160-4.5 MCG/ACT IN AERO: 2.0000 | INHALATION_SPRAY | Freq: Two times a day (BID) | RESPIRATORY_TRACT | 0 refills | Status: DC

## 2019-04-11 NOTE — Patient Instructions (Addendum)
You were seen today by Lauraine Rinne, NP  for:   1. Chronic respiratory failure with hypoxia (HCC)  Continue oxygen therapy as prescribed: 3 L with physical exertion, 2 L at night >>>maintain oxygen saturations greater than 88 percent  >>>if unable to maintain oxygen saturations please contact the office  >>>do not smoke with oxygen  >>>can use nasal saline gel or nasal saline rinses to moisturize nose if oxygen causes dryness   2. COPD GOLD II  Continue Symbicort >>> 2 puffs in the morning right when you wake up, rinse out your mouth after use, 12 hours later 2 puffs, rinse after use >>> Take this daily, no matter what >>> This is not a rescue inhaler   Note your daily symptoms > remember "red flags" for COPD:   >>>Increase in cough >>>increase in sputum production >>>increase in shortness of breath or activity  intolerance.   If you notice these symptoms, please call the office to be seen.    - AMB Referral to [facility]   We recommend today:  Orders Placed This Encounter  Procedures  . AMB Referral to [facility]    Referral Priority:   Routine    Referral Type:   Consultation    Referral Reason:   [facility]    Number of Visits Requested:   1   Orders Placed This Encounter  Procedures  . AMB Referral to [facility]   No orders of the defined types were placed in this encounter.   Follow Up:    Return in about 6 months (around 10/10/2019), or if symptoms worsen or fail to improve, for Follow up with Dr. Elsworth Soho.   Please do your part to reduce the spread of COVID-19:      Reduce your risk of any infection  and COVID19 by using the similar precautions used for avoiding the common cold or flu:  Marland Kitchen Wash your hands often with soap and warm water for at least 20 seconds.  If soap and water are not readily available, use an alcohol-based hand sanitizer with at least 60% alcohol.  . If coughing or sneezing, cover your mouth and nose by  coughing or sneezing into the elbow areas of your shirt or coat, into a tissue or into your sleeve (not your hands). Langley Gauss A MASK when in public  . Avoid shaking hands with others and consider head nods or verbal greetings only. . Avoid touching your eyes, nose, or mouth with unwashed hands.  . Avoid close contact with people who are sick. . Avoid places or events with large numbers of people in one location, like concerts or sporting events. . If you have some symptoms but not all symptoms, continue to monitor at home and seek medical attention if your symptoms worsen. . If you are having a medical emergency, call 911.   [place] / e-Visit: [email or web address]         [facility]: [facility]: [phone]                   [facility]: [phone]     It is flu season:   >>> Best ways to protect herself from the flu: Receive the yearly flu vaccine, practice good hand hygiene washing with soap and also using hand sanitizer when available, eat a nutritious meals, get adequate rest, hydrate appropriately   Please contact  the office if your symptoms worsen or you have concerns that you are not improving.   Thank you for choosing [facility] for your healthcare, and for allowing [place] to partner with you on your healthcare journey. I am thankful to be able to provide care to you today.   Wyn Quaker FNP-C

## 2019-04-11 NOTE — Assessment & Plan Note (Signed)
Plan: Continue oxygen therapy as prescribed-3 L with physical exertion, 2 L with sleep We will send an order to your DME company to ensure that you can be maintained on this dose

## 2019-04-11 NOTE — Assessment & Plan Note (Signed)
Plan: Continue Symbicort 160, sample provided today [facility] for COPD and symptom management telephonic outreach Follow-up with our office in 6 to 12 months

## 2019-04-12 ENCOUNTER — Encounter: Payer: Self-pay | Admitting: Adult Health

## 2019-04-12 ENCOUNTER — Other Ambulatory Visit: Payer: Self-pay

## 2019-04-12 ENCOUNTER — Ambulatory Visit (INDEPENDENT_AMBULATORY_CARE_PROVIDER_SITE_OTHER): Payer: Medicare Other | Admitting: Adult Health

## 2019-04-12 VITALS — BP 100/56 | Temp 97.8°F | Ht 71.5 in | Wt 161.0 lb

## 2019-04-12 DIAGNOSIS — Z23 Encounter for immunization: Secondary | ICD-10-CM | POA: Diagnosis not present

## 2019-04-12 DIAGNOSIS — E119 Type 2 diabetes mellitus without complications: Secondary | ICD-10-CM | POA: Diagnosis not present

## 2019-04-12 DIAGNOSIS — Z125 Encounter for screening for malignant neoplasm of prostate: Secondary | ICD-10-CM

## 2019-04-12 DIAGNOSIS — I5022 Chronic systolic (congestive) heart failure: Secondary | ICD-10-CM

## 2019-04-12 DIAGNOSIS — J449 Chronic obstructive pulmonary disease, unspecified: Secondary | ICD-10-CM | POA: Diagnosis not present

## 2019-04-12 DIAGNOSIS — I10 Essential (primary) hypertension: Secondary | ICD-10-CM | POA: Diagnosis not present

## 2019-04-12 DIAGNOSIS — Z1211 Encounter for screening for malignant neoplasm of colon: Secondary | ICD-10-CM

## 2019-04-12 LAB — CBC WITH DIFFERENTIAL/PLATELET
Basophils Absolute: 0.1 10*3/uL (ref 0.0–0.1)
Basophils Relative: 1.5 % (ref 0.0–3.0)
Eosinophils Absolute: 0.2 10*3/uL (ref 0.0–0.7)
Eosinophils Relative: 2 % (ref 0.0–5.0)
HCT: 33 % — ABNORMAL LOW (ref 39.0–52.0)
Hemoglobin: 10.1 g/dL — ABNORMAL LOW (ref 13.0–17.0)
Lymphocytes Relative: 7 % — ABNORMAL LOW (ref 12.0–46.0)
Lymphs Abs: 0.5 10*3/uL — ABNORMAL LOW (ref 0.7–4.0)
MCHC: 30.6 g/dL (ref 30.0–36.0)
MCV: 74.9 fl — ABNORMAL LOW (ref 78.0–100.0)
Monocytes Absolute: 0.8 10*3/uL (ref 0.1–1.0)
Monocytes Relative: 11.2 % (ref 3.0–12.0)
Neutro Abs: 5.9 10*3/uL (ref 1.4–7.7)
Neutrophils Relative %: 78.3 % — ABNORMAL HIGH (ref 43.0–77.0)
Platelets: 228 10*3/uL (ref 150.0–400.0)
RBC: 4.41 Mil/uL (ref 4.22–5.81)
RDW: 18.4 % — ABNORMAL HIGH (ref 11.5–15.5)
WBC: 7.5 10*3/uL (ref 4.0–10.5)

## 2019-04-12 LAB — LIPID PANEL
Cholesterol: 85 mg/dL (ref 0–200)
HDL: 45.3 mg/dL (ref >39.00)
LDL Cholesterol: 20 mg/dL (ref 0–99)
NonHDL: 39.35
Total CHOL/HDL Ratio: 2
Triglycerides: 95 mg/dL (ref 0.0–149.0)
VLDL: 19 mg/dL (ref 0.0–40.0)

## 2019-04-12 LAB — COMPREHENSIVE METABOLIC PANEL
ALT: 6 U/L (ref 0–53)
AST: 9 U/L (ref 0–37)
Albumin: 4.2 g/dL (ref 3.5–5.2)
Alkaline Phosphatase: 57 U/L (ref 39–117)
BUN: 31 mg/dL — ABNORMAL HIGH (ref 6–23)
CO2: 27 mEq/L (ref 19–32)
Calcium: 9.5 mg/dL (ref 8.4–10.5)
Chloride: 101 mEq/L (ref 96–112)
Creatinine, Ser: 1.13 mg/dL (ref 0.40–1.50)
GFR: 63.81 mL/min (ref >60.00)
Glucose, Bld: 166 mg/dL — ABNORMAL HIGH (ref 70–99)
Potassium: 4.9 mEq/L (ref 3.5–5.1)
Sodium: 138 mEq/L (ref 135–145)
Total Bilirubin: 2.1 mg/dL — ABNORMAL HIGH (ref 0.2–1.2)
Total Protein: 6.6 g/dL (ref 6.0–8.3)

## 2019-04-12 LAB — HEMOGLOBIN A1C: Hgb A1c MFr Bld: 7.5 % — ABNORMAL HIGH (ref 4.6–6.5)

## 2019-04-12 LAB — TSH: TSH: 3.37 u[IU]/mL (ref 0.35–4.50)

## 2019-04-12 LAB — PSA: PSA: 1.85 ng/mL (ref 0.10–4.00)

## 2019-04-12 NOTE — Patient Instructions (Signed)
It was great seeing you today   We will follow up with you regarding your blood work   Please let me know if you need anything    

## 2019-04-12 NOTE — Progress Notes (Signed)
Subjective:    Patient ID: [name], male    DOB: 1946/05/13, 72 y.o.   MRN: [id number]  HPI  Patient presents for yearly preventative medicine examination. He is a pleasant 73 year old male who  has a past medical history of Atrial fibrillation ([place]), Atrial septal defect, Automatic implantable cardioverter-defibrillator in situ, Cardiomyopathy, CHF (congestive heart failure) ([place]), Chronic combined systolic and diastolic CHF (congestive heart failure) ([place]), Colon polyps, COPD (chronic obstructive pulmonary disease) ([place]), COPD GOLD II (01/11/2007), CVA (cerebral vascular accident) ([place]) (2009), Diabetes mellitus without complication ([place]), Dyslipidemia, Dysrhythmia, Ejection fraction < 50%, Endocarditis, Headache(784.0), HLD (hyperlipidemia), Hypertension, Intracranial hemorrhage ([place]), Lung cancer ([place]) (11/29/2013), Mitral regurgitation, Myocardial infarction ([place]) (2010), Pacemaker, Permanent atrial fibrillation, Pneumonia (07/2018), Prosthetic valve dysfunction, Pulmonary hypertension ([place]), Renal artery stenosis (HCC), 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  DM - Controlled with Metformin. He does not monitor his blood sugars at home. Denies feeling hypoglycemic. Appetite is good and he tries to eat a heart healthy diet.   Lab Results  Component Value Date   HGBA1C 7.0 (H) 01/08/2019   COPD -he is maintained on Symbicort 160 continuous oxygen at 3 L with exertion and 2 L at rest and nighttime.  Reports that he has been doing well and no increased shortness of breath past baseline.  He was seen by pulmonary yesterday for recertification of home oxygen concentrator.  H/O Lung CA -former smoker.  Left VATS with wedge resection of left upper lobe by Dr. Loetta Rough in May 2015.  Permanent atrial fibrillation-asymptomatic.  Not a candidate for  anticoagulation due to history of intracranial hemorrhage.  Has had LAA clip with surgery.  Pacemaker dependent  Chronic systolic heart failure-ejection fraction 35%.  No changes in medications were made by cardiology in September 2020  Hyperlipidemia - takes Lipitor. Denies myalgia or fatigue   Lab Results  Component Value Date   CHOL 104 04/06/2018   HDL 48.20 04/06/2018   LDLCALC 33 04/06/2018   LDLDIRECT 80.2 03/31/2011   TRIG 115.0 04/06/2018   CHOLHDL 2 04/06/2018   All immunizations and health maintenance protocols were reviewed with the patient and needed orders were placed.  Appropriate screening laboratory values were ordered for the patient including screening of hyperlipidemia, renal function and hepatic function. If indicated by BPH, a PSA was ordered.  Medication reconciliation,  past medical history, social history, problem list and allergies were reviewed in detail with the patient  Goals were established with regard to weight loss, exercise, and  diet in compliance with medications Wt Readings from Last 3 Encounters:  04/12/19 161 lb (73 kg)  04/11/19 161 lb (73 kg)  03/26/19 160 lb 1.6 oz (72.6 kg)     End of life planning was discussed.  He is not a candidate for colonoscopy.  Last Cologuard in 2016 was negative.  Is okay with doing this again.  He has no acute complaints today   Review of Systems  Constitutional: Negative.   HENT: Positive for hearing loss.   Respiratory: Positive for shortness of breath.   Cardiovascular: Negative.   Gastrointestinal: Negative.   Genitourinary: Negative.   Musculoskeletal: Negative.   Skin: Negative.   Neurological: Negative.   Hematological: Negative.   Psychiatric/Behavioral: Negative.    Past Medical History:  Diagnosis Date  . Atrial fibrillation ([place])    AV Node ablation January, 2010, for rapid atrial fib  .  Atrial septal defect    Closed with surgery January, 2010  . Automatic implantable  cardioverter-defibrillator in situ    LV dysfunction and pacer needed for AV node lesion  . Cardiomyopathy    non-ischemic  . CHF (congestive heart failure) ([place])   . Chronic combined systolic and diastolic CHF (congestive heart failure) ([employer])   . Colon polyps   . COPD (chronic obstructive pulmonary disease) (HCC)    O2- 2 liters, nasal cannula, q night   . COPD GOLD II 01/11/2007   PFT's 09/25/13  FEV1  2.18 (61%) ratio 64 no change p B2 and DLC0  32% corrects to 68% - trial off advair and acei rec starting  08/23/2013     . CVA (cerebral vascular accident) ([place]) 2009   denies residual on 08/14/2013  . Diabetes mellitus without complication ([place])    type 2  . Dyslipidemia   . Dysrhythmia   . Ejection fraction < 50%   . Endocarditis    Bacterial, 2009  . Headache(784.0)    related to stroke only  . HLD (hyperlipidemia)   . Hypertension   . Intracranial hemorrhage (HCC)    Coumadin cannot be used because of the history of his bleed  . Lung cancer ([place]) 11/29/2013   T1N0 Stage Ia non-small cell carcinoma left lung treated with wedge resection  . Mitral regurgitation    Severe symptomatic primary MR due to bacterial endocarditis, treated w/ MVR // Echo 1/18 EF 40-45, diffuse HK, dilated aorta at 42 mm/aortic root 47 mm; linear echogenic structure in ascending aorta - suspect reverberation artifact / consider CT or TEE to rule out dissection flap, bioprosthetic MVR with mean gradient 3, severe LAE, low normal RVSF, severe RAE, moderate TR, PASP 42  . Myocardial infarction ([place]) 2010  . Pacemaker    combo pacer and icd  . Permanent atrial fibrillation    Originally Coumadin use for atrial fibrillation  //   he had intracerebral hemorrhage with an INR of 2.3 June, 2009. Anticoagulation could no longer be used.  //  Rapid atrial fibrillation after inferior MI October, 2010..........Marland Kitchen AV node ablation done at that time with ICD pacemaker placed (EF 35%).   //   Left atrial appendage tied off at  the time of mitral valve surgery January, 2010 (maze pro  . Pneumonia 07/2018  . Prosthetic valve dysfunction    Mild mitral stenosis  . Pulmonary hypertension (HCC)    Moderate  . Renal artery stenosis (HCC)    Mild by history  . 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 Spontaneous pneumothorax    right thoracotomy - distant past  . Status post minimally invasive mitral valve replacement with bioprosthetic valve    33 mm Medtronic Mosaic porcine bioprosthesis placed via right mini thoracotomy for bacterial endocarditis complicated by severe MR and CHF   . Thoracic aortic aneurysm ([place]) 08/11/2016   a - Chest CTA 1/18:  Aneurysmal dilatation of aortic root is noted at 5.1 cm.     Social History   Socioeconomic History  . Marital status: Married    Spouse name: [name]  . Number of children: 0  . Years of education: College  . Highest education level: Not on file  Occupational History  . 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 Financial resource strain: Not on file  . Food insecurity    Worry: Not on file    Inability: Not  on file  . Transportation needs    Medical: Not on file    Non-medical: Not on file  Tobacco Use  . Smoking status: Former Smoker    Packs/day: 1.00    Years: 45.00    Pack years: 45.00    Types: Cigarettes    Quit date: 08/11/2013    Years since quitting: 5.6  . Smokeless tobacco: Never Used  . Tobacco comment: 08/14/2013 "quit smoking in 2009"  Substance and Sexual Activity  . Alcohol use: No    Alcohol/week: 0.0 standard drinks    Comment: 08/14/2013 "used to drink beer; quit:in 1982"  . Drug use: No  . Sexual activity: Yes  Lifestyle  . Physical activity    Days per week: Not on file    Minutes per session: Not on file  . Stress: Not on file  Relationships  . Social Herbalist on phone: Not on file    Gets together: Not on file    Attends religious service: Not on file    Active member of  club or organization: Not on file    Attends meetings of clubs or organizations: Not on file    Relationship status: Not on file  . Intimate partner violence    Fear of current or ex partner: Not on file    Emotionally abused: Not on file    Physically abused: Not on file    Forced sexual activity: Not on file  Other Topics Concern  . Not on file  Social History Narrative   Patient lives at home with his spouse.   Caffeine Use: none   Worked for the post office   Has two boys and a girl. All live local.     Past Surgical History:  Procedure Laterality Date  . APPENDECTOMY    . ASD REPAIR, SECUNDUM  07/17/2008   pericardial patch closure of ASD  . AV NODE ABLATION  07/2008   for rapid atrial fib  . CARDIAC CATHETERIZATION    . CARDIAC DEFIBRILLATOR PLACEMENT  ~ [address] [facility]  . CARDIAC VALVE REPLACEMENT    . CATARACT EXTRACTION W/ INTRAOCULAR LENS  IMPLANT, BILATERAL Bilateral   . ESOPHAGOGASTRODUODENOSCOPY (EGD) WITH PROPOFOL N/A 09/29/2016   Procedure: ESOPHAGOGASTRODUODENOSCOPY (EGD) WITH PROPOFOL;  Surgeon: [name], MD;  Location: [facility] ENDOSCOPY;  Service: Endoscopy;  Laterality: N/A;  . HERNIA REPAIR    . IMPLANTABLE CARDIOVERTER DEFIBRILLATOR (ICD) GENERATOR CHANGE N/A 02/06/2012   Procedure: ICD GENERATOR CHANGE;  Surgeon: [name], MD;  Location: [facility] CATH LAB;  Service: Cardiovascular;  Laterality: N/A;  . INSERT / REPLACE / REMOVE PACEMAKER    . MASS BIOPSY Left    neck mass  . MITRAL VALVE REPLACEMENT Right 07/17/2008   5mm Medtronic Mosaic porcine bioprosthesis  . PENILE PROSTHESIS IMPLANT    . RIGHT HEART CATHETERIZATION N/A 08/16/2013   Procedure: RIGHT HEART CATH;  Surgeon: Josue Hector, MD;  Location: [facility] CATH LAB;  Service: Cardiovascular;  Laterality: N/A;  . TEE WITHOUT CARDIOVERSION N/A 09/06/2016   Procedure: TRANSESOPHAGEAL ECHOCARDIOGRAM (TEE);  Surgeon: Dorothy Spark, MD;  Location: [facility] ENDOSCOPY;  Service: Cardiovascular;  Laterality: N/A;  .  THORACOTOMY Right 1970's   spontaneous pneumothorax - while in the [place]  . TOE SURGERY     left foot hammer toe  . TONSILLECTOMY    . VIDEO ASSISTED THORACOSCOPY (VATS)/WEDGE RESECTION Left 11/29/2013   Procedure: Video assisted thoracoscopy for wedge resection; mini thoracotomy;  Surgeon: [name], MD;  Location: [facility] OR;  Service: Thoracic;  Laterality: Left;    Family History  Problem Relation Age of Onset  . Stomach cancer Father   . Stroke Mother     Allergies  Allergen Reactions  . Anticoagulant Compound Other (See Comments)    Pt had intracranial bleed, therefore all anticoagulation is contraindicated per Dr. Ron Parker  . Other Other (See Comments)    Per Dr. Halford Chessman (Surgeon): stated that the patient cannot be put under for any surgery, as he has an enlarged aorta. He would stand only a 50/50 chance of surviving. He has lung issues, diminished lung tissue, COPD, and emphysema.  . Warfarin Sodium Other (See Comments)    Pt had intracranial bleed, therefore all anticoagulation is contraindicated per Dr. Ron Parker    Current Outpatient Medications on File Prior to Visit  Medication Sig Dispense Refill  . amoxicillin (AMOXIL) 500 MG capsule Take 2,000 mg by mouth See admin instructions. Take 2,000 mg by mouth one hour prior to dental procedures    . aspirin EC 81 MG tablet Take 1 tablet (81 mg total) by mouth daily. 90 tablet 3  . atorvastatin (LIPITOR) 10 MG tablet TAKE 1 TABLET BY MOUTH EVERY DAY 90 tablet 2  . budesonide-formoterol (SYMBICORT) 160-4.5 MCG/ACT inhaler INHALE 2 PUFFS INTO THE LUNGS TWICE A DAY 10.2 Inhaler 6  . budesonide-formoterol (SYMBICORT) 160-4.5 MCG/ACT inhaler Inhale 2 puffs into the lungs 2 (two) times daily. 1 Inhaler 0  . carvedilol (COREG) 3.125 MG tablet TAKE 1 TABLET BY MOUTH TWICE A DAY WITH MEALS 180 tablet 0  . cetirizine (ZYRTEC) 10 MG tablet Take 10 mg by mouth daily.    . furosemide (LASIX) 40 MG tablet TAKE 1 TABLET BY MOUTH EVERY DAY  90 tablet 2  . metFORMIN (GLUCOPHAGE) 1000 MG tablet TAKE 1 TABLET BY MOUTH TWICE A DAY WITH MEALS 180 tablet 0  . nitroGLYCERIN (NITROSTAT) 0.4 MG SL tablet Place 0.4 mg under the tongue every 5 (five) minutes x 3 doses as needed for chest pain.     Marland Kitchen Olopatadine HCl (PAZEO) 0.7 % SOLN Apply 1 drop to eye daily. 1 Bottle 0  . OXYGEN Inhale 2 L into the lungs continuous.     . valsartan (DIOVAN) 40 MG tablet TAKE 1 TABLET BY MOUTH EVERY DAY 90 tablet 0   No current facility-administered medications on file prior to visit.     There were no vitals taken for this visit.      Objective:   Physical Exam Vitals signs and nursing note reviewed.  Constitutional:      Appearance: Normal appearance.  HENT:     Right Ear: Tympanic membrane, ear canal and external ear normal. There is no impacted cerumen.     Left Ear: Tympanic membrane, ear canal and external ear normal. There is no impacted cerumen.     Ears:     Comments: HOH    Nose: Nose normal. No congestion or rhinorrhea.     Mouth/Throat:     Mouth: Mucous membranes are moist.     Pharynx: Oropharynx is clear.  Eyes:     Extraocular Movements: Extraocular movements intact.     Conjunctiva/sclera: Conjunctivae normal.     Pupils: Pupils are equal, round, and reactive to light.  Cardiovascular:     Rate and Rhythm: Normal rate and regular rhythm.     Pulses: Normal pulses.     Heart sounds: Murmur present. Systolic murmur  present. No friction rub.  Pulmonary:     Effort: Pulmonary effort is normal. No respiratory distress.     Breath sounds: No stridor. Decreased breath sounds (bilateral ) present. No wheezing, rhonchi or rales.     Comments: 2 L O2 via nasal cannula Chest:     Chest wall: No tenderness.  Abdominal:     General: Abdomen is flat. Bowel sounds are normal. There is no distension.     Palpations: Abdomen is soft. There is no mass.     Tenderness: There is no abdominal tenderness. There is no right CVA tenderness,  left CVA tenderness, guarding or rebound.     Hernia: No hernia is present.  Musculoskeletal: Normal range of motion.        General: No swelling or tenderness.     Right lower leg: No edema.     Left lower leg: No edema.  Skin:    General: Skin is warm and dry.     Capillary Refill: Capillary refill takes less than 2 seconds.     Coloration: Skin is not jaundiced or pale.     Findings: No bruising, erythema, lesion or rash.  Neurological:     General: No focal deficit present.     Mental Status: He is alert and oriented to person, place, and time.     Cranial Nerves: No cranial nerve deficit.     Sensory: No sensory deficit.     Motor: No weakness.     Coordination: Coordination normal.     Gait: Gait normal.     Deep Tendon Reflexes: Reflexes normal.  Psychiatric:        Mood and Affect: Mood normal.        Behavior: Behavior normal.        Thought Content: Thought content normal.        Judgment: Judgment normal.       Assessment & Plan:  1. Type 2 diabetes mellitus without complication, without long-term current use of insulin (HCC) - Consider increase in metformin  - Continue to stay active and exercise  - CBC with Differential/Platelet - Comprehensive metabolic panel - Hemoglobin A1c - Lipid panel - TSH  2. Essential hypertension - No change in medications  - CBC with Differential/Platelet - Comprehensive metabolic panel - Hemoglobin A1c - Lipid panel - TSH  3. Chronic systolic CHF (congestive heart failure) (HCC) -Change in medications.  Follow-up with cardiology as directed - CBC with Differential/Platelet - Comprehensive metabolic panel - Hemoglobin A1c - Lipid panel - TSH  4. COPD GOLD II -Continue Symbicort.  Follow-up with pulmonary as directed - CBC with Differential/Platelet - Comprehensive metabolic panel - Hemoglobin A1c - Lipid panel - TSH  5. Prostate cancer screening  - PSA  6. Need for prophylactic vaccination and inoculation  against influenza  - Flu Vaccine QUAD High Dose(Fluad)  7. Colon cancer screening - Will order cologuard   [name], NP

## 2019-04-15 ENCOUNTER — Other Ambulatory Visit: Payer: Self-pay | Admitting: *Deleted

## 2019-04-15 NOTE — Patient Outreach (Signed)
[place]) Care Management  04/15/2019  [name] 1946/05/13 [id number]    RN spoke briefly with the pt and verified identifiers and received permission to speak with his spouse today. RN introduced [facility] services to the spouse and the purpose for today's call. Explained the available services and communication that would take place with the pt's provider concerning today's assessment (receptive). Wife started to explain the many services that are calling and how overwhelming these sources are becoming however wife familiar with [facility] in the past and aware of some of the services. Several topics discussed related to pt's medical history and RN further inquired on pt's COPD. Pt reports she has some information on the COPD action plan and denies pt having any issues with swelling, no weight gained and no SOB. States pt uses his inhalers when needed with no encountered problems at this time. Reports a recent visit to his pulmonologist with pt now requiring home O2. Pt uses mostly at night but this is helping.  RN offered to send additional update information on COPD along with several letters that would contact [place] contacts. Offered to enrolling the pt into the COPD program with monthly or quarterly contact to assist pt with managing his COPD along with increase in his knowledge base related to his COPD. Pt receptive and aware Dr. Carlisle Cater would be notified of pt's disposition in participating in the program.   RN will generate a care plan related to COPD knowledge based and adherence with medications and scheduled appointments (pt agreed). Primary provider will be aware of the goals of care related to the care plan noted below. Due to the introduction today offered to call back at a later date to completed the initial assessment for entering the program. Wife agreed with next Thursday morning. Will follow up accordingly with the additional information to completed the assessment. No other  inquires or questions at this time.  THN CM Care Plan Problem One     Most Recent Value  Care Plan Problem One  Deficient Knowledge Related to COPD unfamiliarity with information/education  Role Documenting the Problem One  Care Management Coordinator  Care Plan for Problem One  Active  THN Long Term Goal   Pt will verbalize two symptoms of COPD exacerbation within the next 90 days.  THN Long Term Goal Start Date  04/15/19  Interventions for Problem One Long Term Goal  Will discuss the COPD action plan and verified the importance of pt staying in the GREEN zone. Will send printed educational material for future guidance on what to do if symptoms become acute.  THN CM Short Term Goal #1   Adherence with medications related to COPD within the next 30 days.  THN CM Short Term Goal #1 Start Date  04/15/19  Interventions for Short Term Goal #1  Will review all medications and verify supplies and pt's knowledge of his inhalers. Will verify usage and educate accordingly.  THN CM Short Term Goal #2   Adherence with all medical appointments over the next 30 days.  THN CM Short Term Goal #2 Start Date  04/15/19  Interventions for Short Term Goal #2  Will stress the importance of attending all appointments to avoid acute symptoms from occurring. Will also verify pt has sufficient transportation. Based upon the limited support system will also offer additional transportation resources if this is evere needed.      Raina Mina, RN Care Management Coordinator [place] Office [phone]

## 2019-04-19 ENCOUNTER — Telehealth: Payer: Self-pay | Admitting: Family Medicine

## 2019-04-19 NOTE — Telephone Encounter (Signed)
Error

## 2019-04-23 ENCOUNTER — Telehealth: Payer: Self-pay | Admitting: Pulmonary Disease

## 2019-04-23 NOTE — Telephone Encounter (Signed)
Returned call to [place] re: 02 orders  Needed actual documentation of ambulatory walk. Fax # [phone] Also wanted to know if patient was have exacerbation day of visit. Notified 1 year f/u 02 re qualification not in acute state. Faxed documentation Nothing further needed at this time.     04/11/2019  - Visit   73 year old male former smoker presenting to our office today as a one-year follow-up visit.  Patient also reporting he needs to be requalified for a concentrator at home.  His DME is [facility].  His old concentrator that he had at home broke needed to be replaced after 5 years.  He already has obtained the new concentrator but needs a prescription from our office so that way he does not have to pay for everything out of pocket.  Patient reports he still maintained on Symbicort 160 and is doing well.  He reports no new or acute concerns today.  On arrival to our office patient was walked.  Patient qualified for oxygen 3 L with exertion continuous.  Patient also needs to be maintained on 2 L of O2 at night.

## 2019-04-25 ENCOUNTER — Encounter: Payer: Self-pay | Admitting: *Deleted

## 2019-04-25 ENCOUNTER — Telehealth: Payer: Self-pay | Admitting: Pulmonary Disease

## 2019-04-25 ENCOUNTER — Other Ambulatory Visit: Payer: Self-pay | Admitting: *Deleted

## 2019-04-25 DIAGNOSIS — J9611 Chronic respiratory failure with hypoxia: Secondary | ICD-10-CM

## 2019-04-25 DIAGNOSIS — J449 Chronic obstructive pulmonary disease, unspecified: Secondary | ICD-10-CM

## 2019-04-25 NOTE — Telephone Encounter (Signed)
Spoke with [name]  She states [employer] is needing further information from out office  I called and spoke with Levada Dy at [facility]  She states that they have all the information that they need from out office, however they have an insurance issue that they need to speak with the pt about and have not been able to reach the pt due to the pt having blocked their phone calls using smart block  They need to call [employer] at [phone] or [phone]  I spoke with the pt's spouse and notified of recs and she verbalized understanding

## 2019-04-25 NOTE — Patient Outreach (Signed)
[facility]) Care Management  04/25/2019  [name] June 09, 1946 [id number]    Telephone Assessment-COPD (initial assessment completed)  RN spoke with pt today and completed the initial assessment. Also received an updated on her ongoing management of care related to his COPD. Pt confirmed he received the educational material in the mail and continues to review the information. Able to recite some information as pt informed to review for increase knowledge bases and this topic will be further discussed on the next follow up call next month. Discussed the possible symptoms if encountered and what to do if acute along with verifying pt remains in the GREEN zone today with no precipitating symptoms. Pt indicated he had an office visit with his primary provider 2 weeks ago for his annual with no reported problems or issues. Pt will follow up in 6 months on the next visit. Confirms he is taking all his prescribed medications and attending all medical appointments as pt continues to drive with no problems.   Will update the plan of care and continue to communicate with pt's primary provider accordingly concerning pt's participation in the program. No other inquires or request as pt informed of other [facility] services for pharmacy and/or social worker for any needed resources. Will follow up next month concerning pt's ongoing progress.  THN CM Care Plan Problem One     Most Recent Value  Care Plan Problem One  Deficient Knowledge Related to COPD unfamiliarity with information/education  Role Documenting the Problem One  Care Management Coordinator  Care Plan for Problem One  Active  THN Long Term Goal   Pt will verbalize two symptoms of COPD exacerbation within the next 90 days.  THN Long Term Goal Start Date  04/15/19  Interventions for Problem One Long Term Goal  Pt received COPD packet. RN reviewed the material and  reiterated on the COPD action plan verifying pt remains in the GREEN  zone with no acute problems. Will continue to encourage pt to read all material and discussed the possible signs and symptoms he may encounter and what to do if acute sypmtoms are presented.  THN CM Short Term Goal #1   Adherence with medications related to COPD within the next 30 days.  THN CM Short Term Goal #1 Start Date  04/15/19  THN CM Short Term Goal #1 Met Date  04/25/19  THN CM Short Term Goal #2   Adherence with all medical appointments over the next 30 days.  THN CM Short Term Goal #2 Start Date  04/15/19  [facility] CM Short Term Goal #2 Met Date  04/25/19      Raina Mina, RN Care Management Coordinator [place] Office [phone]

## 2019-04-25 NOTE — Telephone Encounter (Signed)
Last office note and qualifying walk results from 04/11/19 faxed to [employer] at [phone]. Called [employer] billing number [phone], spoke with rep.  All needed documentation for oxygen has been received and is being processed. [employer] Patient's Wife x's 3, busy signal each time.

## 2019-04-26 NOTE — Telephone Encounter (Signed)
Left message for patient to call back  

## 2019-04-26 NOTE — Telephone Encounter (Signed)
Pt called stated that she has found a company that will take over husband O2 machine. [employer] [phone] fax number [phone]. Please advise

## 2019-04-27 ENCOUNTER — Other Ambulatory Visit: Payer: Self-pay

## 2019-04-27 ENCOUNTER — Inpatient Hospital Stay (HOSPITAL_COMMUNITY)
Admission: EM | Admit: 2019-04-27 | Discharge: 2019-05-02 | DRG: 291 | Disposition: A | Discharge status: Home / Self Care | Payer: Medicare Other | Attending: Internal Medicine | Admitting: Internal Medicine

## 2019-04-27 ENCOUNTER — Emergency Department (HOSPITAL_COMMUNITY): Payer: Medicare Other

## 2019-04-27 DIAGNOSIS — Z66 Do not resuscitate: Secondary | ICD-10-CM | POA: Diagnosis present

## 2019-04-27 DIAGNOSIS — I712 Thoracic aortic aneurysm, without rupture: Secondary | ICD-10-CM | POA: Diagnosis present

## 2019-04-27 DIAGNOSIS — E119 Type 2 diabetes mellitus without complications: Secondary | ICD-10-CM

## 2019-04-27 DIAGNOSIS — Z87891 Personal history of nicotine dependence: Secondary | ICD-10-CM

## 2019-04-27 DIAGNOSIS — Z888 Allergy status to other drugs, medicaments and biological substances status: Secondary | ICD-10-CM

## 2019-04-27 DIAGNOSIS — J9621 Acute and chronic respiratory failure with hypoxia: Secondary | ICD-10-CM | POA: Diagnosis present

## 2019-04-27 DIAGNOSIS — Z9981 Dependence on supplemental oxygen: Secondary | ICD-10-CM

## 2019-04-27 DIAGNOSIS — Q2543 Congenital aneurysm of aorta: Secondary | ICD-10-CM

## 2019-04-27 DIAGNOSIS — E1159 Type 2 diabetes mellitus with other circulatory complications: Secondary | ICD-10-CM | POA: Diagnosis present

## 2019-04-27 DIAGNOSIS — R079 Chest pain, unspecified: Secondary | ICD-10-CM | POA: Diagnosis not present

## 2019-04-27 DIAGNOSIS — I10 Essential (primary) hypertension: Secondary | ICD-10-CM | POA: Diagnosis present

## 2019-04-27 DIAGNOSIS — I110 Hypertensive heart disease with heart failure: Secondary | ICD-10-CM | POA: Diagnosis not present

## 2019-04-27 DIAGNOSIS — D638 Anemia in other chronic diseases classified elsewhere: Secondary | ICD-10-CM | POA: Diagnosis present

## 2019-04-27 DIAGNOSIS — Z20828 Contact with and (suspected) exposure to other viral communicable diseases: Secondary | ICD-10-CM | POA: Diagnosis present

## 2019-04-27 DIAGNOSIS — Z9841 Cataract extraction status, right eye: Secondary | ICD-10-CM

## 2019-04-27 DIAGNOSIS — I701 Atherosclerosis of renal artery: Secondary | ICD-10-CM | POA: Diagnosis present

## 2019-04-27 DIAGNOSIS — Z7982 Long term (current) use of aspirin: Secondary | ICD-10-CM

## 2019-04-27 DIAGNOSIS — I2510 Atherosclerotic heart disease of native coronary artery without angina pectoris: Secondary | ICD-10-CM | POA: Diagnosis present

## 2019-04-27 DIAGNOSIS — I5043 Acute on chronic combined systolic (congestive) and diastolic (congestive) heart failure: Secondary | ICD-10-CM

## 2019-04-27 DIAGNOSIS — Z823 Family history of stroke: Secondary | ICD-10-CM

## 2019-04-27 DIAGNOSIS — Z79899 Other long term (current) drug therapy: Secondary | ICD-10-CM

## 2019-04-27 DIAGNOSIS — Z9889 Other specified postprocedural states: Secondary | ICD-10-CM

## 2019-04-27 DIAGNOSIS — Z95810 Presence of automatic (implantable) cardiac defibrillator: Secondary | ICD-10-CM | POA: Diagnosis present

## 2019-04-27 DIAGNOSIS — R069 Unspecified abnormalities of breathing: Secondary | ICD-10-CM | POA: Diagnosis not present

## 2019-04-27 DIAGNOSIS — Z85118 Personal history of other malignant neoplasm of bronchus and lung: Secondary | ICD-10-CM

## 2019-04-27 DIAGNOSIS — Z961 Presence of intraocular lens: Secondary | ICD-10-CM | POA: Diagnosis present

## 2019-04-27 DIAGNOSIS — E785 Hyperlipidemia, unspecified: Secondary | ICD-10-CM | POA: Diagnosis present

## 2019-04-27 DIAGNOSIS — I5084 End stage heart failure: Secondary | ICD-10-CM | POA: Diagnosis present

## 2019-04-27 DIAGNOSIS — I130 Hypertensive heart and chronic kidney disease with heart failure and stage 1 through stage 4 chronic kidney disease, or unspecified chronic kidney disease: Principal | ICD-10-CM | POA: Diagnosis present

## 2019-04-27 DIAGNOSIS — J449 Chronic obstructive pulmonary disease, unspecified: Secondary | ICD-10-CM | POA: Diagnosis present

## 2019-04-27 DIAGNOSIS — Z8673 Personal history of transient ischemic attack (TIA), and cerebral infarction without residual deficits: Secondary | ICD-10-CM

## 2019-04-27 DIAGNOSIS — Z800 Family history of malignant neoplasm of digestive organs: Secondary | ICD-10-CM

## 2019-04-27 DIAGNOSIS — I4821 Permanent atrial fibrillation: Secondary | ICD-10-CM | POA: Diagnosis not present

## 2019-04-27 DIAGNOSIS — R531 Weakness: Secondary | ICD-10-CM

## 2019-04-27 DIAGNOSIS — Q2549 Other congenital malformations of aorta: Secondary | ICD-10-CM

## 2019-04-27 DIAGNOSIS — D509 Iron deficiency anemia, unspecified: Secondary | ICD-10-CM | POA: Diagnosis present

## 2019-04-27 DIAGNOSIS — R0602 Shortness of breath: Secondary | ICD-10-CM | POA: Diagnosis not present

## 2019-04-27 DIAGNOSIS — Z8719 Personal history of other diseases of the digestive system: Secondary | ICD-10-CM

## 2019-04-27 DIAGNOSIS — E1122 Type 2 diabetes mellitus with diabetic chronic kidney disease: Secondary | ICD-10-CM | POA: Diagnosis present

## 2019-04-27 DIAGNOSIS — Z7984 Long term (current) use of oral hypoglycemic drugs: Secondary | ICD-10-CM

## 2019-04-27 DIAGNOSIS — N182 Chronic kidney disease, stage 2 (mild): Secondary | ICD-10-CM | POA: Diagnosis present

## 2019-04-27 DIAGNOSIS — Z9842 Cataract extraction status, left eye: Secondary | ICD-10-CM

## 2019-04-27 DIAGNOSIS — I4892 Unspecified atrial flutter: Secondary | ICD-10-CM | POA: Diagnosis present

## 2019-04-27 DIAGNOSIS — I959 Hypotension, unspecified: Secondary | ICD-10-CM | POA: Diagnosis present

## 2019-04-27 DIAGNOSIS — I428 Other cardiomyopathies: Secondary | ICD-10-CM | POA: Diagnosis present

## 2019-04-27 DIAGNOSIS — I272 Pulmonary hypertension, unspecified: Secondary | ICD-10-CM | POA: Diagnosis present

## 2019-04-27 DIAGNOSIS — I252 Old myocardial infarction: Secondary | ICD-10-CM

## 2019-04-27 DIAGNOSIS — Z952 Presence of prosthetic heart valve: Secondary | ICD-10-CM

## 2019-04-27 DIAGNOSIS — Z7951 Long term (current) use of inhaled steroids: Secondary | ICD-10-CM

## 2019-04-27 LAB — TROPONIN I (HIGH SENSITIVITY)
Troponin I (High Sensitivity): 19 ng/L — ABNORMAL HIGH (ref <18)
Troponin I (High Sensitivity): 17 ng/L (ref <18)

## 2019-04-27 LAB — BRAIN NATRIURETIC PEPTIDE: B Natriuretic Peptide: 630 pg/mL — ABNORMAL HIGH (ref 0.0–100.0)

## 2019-04-27 LAB — RETICULOCYTES
Immature Retic Fract: 26.9 % — ABNORMAL HIGH (ref 2.3–15.9)
RBC.: 4.43 MIL/uL (ref 4.22–5.81)
Retic Count, Absolute: 96.1 10*3/uL (ref 19.0–186.0)
Retic Ct Pct: 2.2 % (ref 0.4–3.1)

## 2019-04-27 LAB — GLUCOSE, CAPILLARY: Glucose-Capillary: 399 mg/dL — ABNORMAL HIGH (ref 70–99)

## 2019-04-27 LAB — CBC WITH DIFFERENTIAL/PLATELET
Abs Immature Granulocytes: 0.04 10*3/uL (ref 0.00–0.07)
Basophils Absolute: 0.1 10*3/uL (ref 0.0–0.1)
Basophils Relative: 1 %
Eosinophils Absolute: 0.1 10*3/uL (ref 0.0–0.5)
Eosinophils Relative: 1 %
HCT: 31.6 % — ABNORMAL LOW (ref 39.0–52.0)
Hemoglobin: 9.3 g/dL — ABNORMAL LOW (ref 13.0–17.0)
Immature Granulocytes: 1 %
Lymphocytes Relative: 7 %
Lymphs Abs: 0.6 10*3/uL — ABNORMAL LOW (ref 0.7–4.0)
MCH: 23.4 pg — ABNORMAL LOW (ref 26.0–34.0)
MCHC: 29.4 g/dL — ABNORMAL LOW (ref 30.0–36.0)
MCV: 79.4 fL — ABNORMAL LOW (ref 80.0–100.0)
Monocytes Absolute: 0.9 10*3/uL (ref 0.1–1.0)
Monocytes Relative: 11 %
Neutro Abs: 6.8 10*3/uL (ref 1.7–7.7)
Neutrophils Relative %: 79 %
Platelets: 233 10*3/uL (ref 150–400)
RBC: 3.98 MIL/uL — ABNORMAL LOW (ref 4.22–5.81)
RDW: 18.3 % — ABNORMAL HIGH (ref 11.5–15.5)
WBC: 8.4 10*3/uL (ref 4.0–10.5)
nRBC: 0.2 % (ref 0.0–0.2)

## 2019-04-27 LAB — BASIC METABOLIC PANEL
Anion gap: 12 (ref 5–15)
BUN: 25 mg/dL — ABNORMAL HIGH (ref 8–23)
CO2: 23 mmol/L (ref 22–32)
Calcium: 8.9 mg/dL (ref 8.9–10.3)
Chloride: 104 mmol/L (ref 98–111)
Creatinine, Ser: 1.18 mg/dL (ref 0.61–1.24)
GFR calc Af Amer: >60 mL/min (ref >60)
GFR calc non Af Amer: >60 mL/min (ref >60)
Glucose, Bld: 139 mg/dL — ABNORMAL HIGH (ref 70–99)
Potassium: 4.4 mmol/L (ref 3.5–5.1)
Sodium: 139 mmol/L (ref 135–145)

## 2019-04-27 LAB — VITAMIN B12: Vitamin B-12: 88 pg/mL — ABNORMAL LOW (ref 180–914)

## 2019-04-27 LAB — IRON AND TIBC
Iron: 22 ug/dL — ABNORMAL LOW (ref 45–182)
Saturation Ratios: 5 % — ABNORMAL LOW (ref 17.9–39.5)
TIBC: 442 ug/dL (ref 250–450)
UIBC: 420 ug/dL

## 2019-04-27 LAB — SARS CORONAVIRUS 2 (TAT 6-24 HRS): SARS Coronavirus 2: NEGATIVE

## 2019-04-27 LAB — FOLATE: Folate: 19.5 ng/mL (ref >5.9)

## 2019-04-27 LAB — FERRITIN: Ferritin: 10 ng/mL — ABNORMAL LOW (ref 24–336)

## 2019-04-27 MED ORDER — NITROGLYCERIN 0.4 MG SL SUBL
0.4000 mg | SUBLINGUAL_TABLET | SUBLINGUAL | Status: DC | PRN
  Administered 2019-04-29: 11:00:00 0.4 mg via SUBLINGUAL
  Filled 2019-04-27: qty 1

## 2019-04-27 MED ORDER — ASPIRIN EC 81 MG PO TBEC
81.0000 mg | DELAYED_RELEASE_TABLET | Freq: Every day | ORAL | Status: DC
  Administered 2019-04-28 – 2019-05-02 (×5): 81 mg via ORAL
  Filled 2019-04-27 (×5): qty 1

## 2019-04-27 MED ORDER — ATORVASTATIN CALCIUM 10 MG PO TABS
10.0000 mg | ORAL_TABLET | Freq: Every day | ORAL | Status: DC
  Administered 2019-04-28 – 2019-05-02 (×5): 10 mg via ORAL
  Filled 2019-04-27 (×5): qty 1

## 2019-04-27 MED ORDER — SODIUM CHLORIDE 0.9% FLUSH
3.0000 mL | INTRAVENOUS | Status: DC | PRN
  Administered 2019-04-29: 3 mL via INTRAVENOUS
  Filled 2019-04-27: qty 3

## 2019-04-27 MED ORDER — SODIUM CHLORIDE 0.9% FLUSH
3.0000 mL | Freq: Two times a day (BID) | INTRAVENOUS | Status: DC
  Administered 2019-04-27 – 2019-05-02 (×9): 3 mL via INTRAVENOUS

## 2019-04-27 MED ORDER — ALBUTEROL SULFATE HFA 108 (90 BASE) MCG/ACT IN AERS
2.0000 | INHALATION_SPRAY | Freq: Once | RESPIRATORY_TRACT | Status: AC
  Administered 2019-04-27: 2 via RESPIRATORY_TRACT
  Filled 2019-04-27: qty 6.7

## 2019-04-27 MED ORDER — ACETAMINOPHEN 325 MG PO TABS
650.0000 mg | ORAL_TABLET | ORAL | Status: DC | PRN
  Administered 2019-04-29 – 2019-04-30 (×3): 650 mg via ORAL
  Filled 2019-04-27 (×3): qty 2

## 2019-04-27 MED ORDER — SODIUM CHLORIDE 0.9 % IV SOLN
250.0000 mL | INTRAVENOUS | Status: DC | PRN
  Administered 2019-04-28: 250 mL via INTRAVENOUS

## 2019-04-27 MED ORDER — CARVEDILOL 3.125 MG PO TABS
3.1250 mg | ORAL_TABLET | Freq: Two times a day (BID) | ORAL | Status: DC
  Administered 2019-04-27 – 2019-04-30 (×4): 3.125 mg via ORAL
  Filled 2019-04-27 (×9): qty 1

## 2019-04-27 MED ORDER — METHYLPREDNISOLONE SODIUM SUCC 125 MG IJ SOLR
80.0000 mg | Freq: Once | INTRAMUSCULAR | Status: AC
  Administered 2019-04-27: 80 mg via INTRAVENOUS
  Filled 2019-04-27: qty 2

## 2019-04-27 MED ORDER — MOMETASONE FURO-FORMOTEROL FUM 200-5 MCG/ACT IN AERO
2.0000 | INHALATION_SPRAY | Freq: Two times a day (BID) | RESPIRATORY_TRACT | Status: DC
  Administered 2019-04-28 – 2019-05-02 (×8): 2 via RESPIRATORY_TRACT
  Filled 2019-04-27: qty 8.8

## 2019-04-27 MED ORDER — IRBESARTAN 75 MG PO TABS
37.5000 mg | ORAL_TABLET | Freq: Every day | ORAL | Status: DC
  Administered 2019-04-28: 37.5 mg via ORAL
  Filled 2019-04-27: qty 0.5

## 2019-04-27 MED ORDER — FUROSEMIDE 10 MG/ML IJ SOLN
40.0000 mg | Freq: Two times a day (BID) | INTRAMUSCULAR | Status: DC
  Administered 2019-04-27 – 2019-05-01 (×7): 40 mg via INTRAVENOUS
  Filled 2019-04-27 (×7): qty 4

## 2019-04-27 MED ORDER — POTASSIUM CHLORIDE 20 MEQ/15ML (10%) PO SOLN
20.0000 meq | Freq: Every day | ORAL | Status: DC
  Administered 2019-04-28 – 2019-05-02 (×5): 20 meq via ORAL
  Filled 2019-04-27 (×6): qty 15

## 2019-04-27 MED ORDER — ALBUTEROL SULFATE (2.5 MG/3ML) 0.083% IN NEBU: 2.5000 mg | INHALATION_SOLUTION | Freq: Four times a day (QID) | RESPIRATORY_TRACT | Status: DC | PRN

## 2019-04-27 MED ORDER — MORPHINE SULFATE (PF) 4 MG/ML IV SOLN
4.0000 mg | Freq: Once | INTRAVENOUS | Status: AC
  Administered 2019-04-27: 4 mg via INTRAVENOUS
  Filled 2019-04-27: qty 1

## 2019-04-27 MED ORDER — ONDANSETRON HCL 4 MG/2ML IJ SOLN: 4.0000 mg | Freq: Four times a day (QID) | INTRAMUSCULAR | Status: DC | PRN

## 2019-04-27 MED ORDER — INSULIN ASPART 100 UNIT/ML ~~LOC~~ SOLN
8.0000 [IU] | Freq: Once | SUBCUTANEOUS | Status: AC
  Administered 2019-04-27: 8 [IU] via SUBCUTANEOUS

## 2019-04-27 MED ORDER — FUROSEMIDE 10 MG/ML IJ SOLN
60.0000 mg | Freq: Once | INTRAMUSCULAR | Status: AC
  Administered 2019-04-27: 60 mg via INTRAVENOUS
  Filled 2019-04-27: qty 6

## 2019-04-27 MED ORDER — POTASSIUM CHLORIDE CRYS ER 20 MEQ PO TBCR
20.0000 meq | EXTENDED_RELEASE_TABLET | Freq: Once | ORAL | Status: AC
  Administered 2019-04-27: 17:00:00 20 meq via ORAL
  Filled 2019-04-27: qty 1

## 2019-04-27 MED ORDER — INSULIN ASPART 100 UNIT/ML ~~LOC~~ SOLN
0.0000 [IU] | Freq: Three times a day (TID) | SUBCUTANEOUS | Status: DC
  Administered 2019-04-28 (×2): 2 [IU] via SUBCUTANEOUS
  Administered 2019-04-28 – 2019-04-29 (×2): 3 [IU] via SUBCUTANEOUS
  Administered 2019-04-29: 1 [IU] via SUBCUTANEOUS
  Administered 2019-04-29 – 2019-04-30 (×2): 2 [IU] via SUBCUTANEOUS
  Administered 2019-04-30 – 2019-05-02 (×5): 1 [IU] via SUBCUTANEOUS

## 2019-04-27 NOTE — ED Notes (Signed)
ED TO INPATIENT HANDOFF REPORT  ED Nurse Name and Phone #: Navpreet Szczygiel [phone]  S Name/Age/Gender [name] 73 y.o. male Room/Bed: 169C/789F  Code Status   Code Status: DNR  Home/SNF/Other Home Patient oriented to: self, place and situation Is this baseline? Yes   Triage Complete: Triage complete  Chief Complaint sob  Triage Note Pt states he started having increased shortness of breath since earlier today.  Report hx of COPD and lung CA.  Pt normally on 2L [place] at home   Allergies Allergies  Allergen Reactions  . Anticoagulant Compound Other (See Comments)    Pt had intracranial bleed, therefore all anticoagulation is contraindicated per Dr. Ron Parker  . Other Other (See Comments)    Per Dr. Halford Chessman (Surgeon): stated that the patient cannot be put under for any surgery, as he has an enlarged aorta. He would stand only a 50/50 chance of surviving. He has lung issues, diminished lung tissue, COPD, and emphysema.  . Warfarin Sodium Other (See Comments)    Pt had intracranial bleed, therefore all anticoagulation is contraindicated per Dr. Ron Parker    Level of Care/Admitting Diagnosis ED Disposition    ED Disposition Condition [place]: [place] [100100]  Level of Care: Telemetry Cardiac [103]  I expect the patient will be discharged within 24 hours: No (not a candidate for 5C-Observation unit)  Covid Evaluation: Asymptomatic Screening Protocol (No Symptoms)  Diagnosis: Acute on chronic combined systolic (congestive) and diastolic (congestive) heart failure [facility]) [810175]  Admitting Physician: Lenore Cordia [[id number]]  Attending Physician: Lenore Cordia [[id number]]  PT Class (Do Not Modify): Observation [104]  PT Acc Code (Do Not Modify): Observation [10022]       B Medical/Surgery History Past Medical History:  Diagnosis Date  . Atrial fibrillation ([place])    AV Node ablation January, 2010, for rapid atrial fib  . Atrial  septal defect    Closed with surgery January, 2010  . Automatic implantable cardioverter-defibrillator in situ    LV dysfunction and pacer needed for AV node lesion  . Cardiomyopathy    non-ischemic  . CHF (congestive heart failure) ([place])   . Chronic combined systolic and diastolic CHF (congestive heart failure) ([place])   . Colon polyps   . COPD (chronic obstructive pulmonary disease) (HCC)    O2- 2 liters, nasal cannula, q night   . COPD GOLD II 01/11/2007   PFT's 09/25/13  FEV1  2.18 (61%) ratio 64 no change p B2 and DLC0  32% corrects to 68% - trial off advair and acei rec starting  08/23/2013     . CVA (cerebral vascular accident) ([place]) 2009   denies residual on 08/14/2013  . Diabetes mellitus without complication ([place])    type 2  . Dyslipidemia   . Dysrhythmia   . Ejection fraction < 50%   . Endocarditis    Bacterial, 2009  . Headache(784.0)    related to stroke only  . HLD (hyperlipidemia)   . Hypertension   . Intracranial hemorrhage (HCC)    Coumadin cannot be used because of the history of his bleed  . Lung cancer ([place]) 11/29/2013   T1N0 Stage Ia non-small cell carcinoma left lung treated with wedge resection  . Mitral regurgitation    Severe symptomatic primary MR due to bacterial endocarditis, treated w/ MVR // Echo 1/18 EF 40-45, diffuse HK, dilated aorta at 42 mm/aortic root 47 mm; linear echogenic structure in ascending aorta -  suspect reverberation artifact / consider CT or TEE to rule out dissection flap, bioprosthetic MVR with mean gradient 3, severe LAE, low normal RVSF, severe RAE, moderate TR, PASP 42  . Myocardial infarction ([place]) 2010  . Pacemaker    combo pacer and icd  . Permanent atrial fibrillation    Originally Coumadin use for atrial fibrillation  //   he had intracerebral hemorrhage with an INR of 2.3 June, 2009. Anticoagulation could no longer be used.  //  Rapid atrial fibrillation after inferior MI October, 2010..........Marland Kitchen AV node ablation done at that time  with ICD pacemaker placed (EF 35%).   //   Left atrial appendage tied off at the time of mitral valve surgery January, 2010 (maze pro  . Pneumonia 07/2018  . Prosthetic valve dysfunction    Mild mitral stenosis  . Pulmonary hypertension (HCC)    Moderate  . Renal artery stenosis (HCC)    Mild by history  . 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 Spontaneous pneumothorax    right thoracotomy - distant past  . Status post minimally invasive mitral valve replacement with bioprosthetic valve    33 mm Medtronic Mosaic porcine bioprosthesis placed via right mini thoracotomy for bacterial endocarditis complicated by severe MR and CHF   . Thoracic aortic aneurysm ([place]) 08/11/2016   a - Chest CTA 1/18:  Aneurysmal dilatation of aortic root is noted at 5.1 cm.    Past Surgical History:  Procedure Laterality Date  . APPENDECTOMY    . ASD REPAIR, SECUNDUM  07/17/2008   pericardial patch closure of ASD  . AV NODE ABLATION  07/2008   for rapid atrial fib  . CARDIAC CATHETERIZATION    . CARDIAC DEFIBRILLATOR PLACEMENT  ~ [address] [place]  . CARDIAC VALVE REPLACEMENT    . CATARACT EXTRACTION W/ INTRAOCULAR LENS  IMPLANT, BILATERAL Bilateral   . ESOPHAGOGASTRODUODENOSCOPY (EGD) WITH PROPOFOL N/A 09/29/2016   Procedure: ESOPHAGOGASTRODUODENOSCOPY (EGD) WITH PROPOFOL;  Surgeon: [name], MD;  Location: [facility] ENDOSCOPY;  Service: Endoscopy;  Laterality: N/A;  . HERNIA REPAIR    . IMPLANTABLE CARDIOVERTER DEFIBRILLATOR (ICD) GENERATOR CHANGE N/A 02/06/2012   Procedure: ICD GENERATOR CHANGE;  Surgeon: [name], MD;  Location: [facility] C[facility] LAB;  Service: Cardiovascular;  Laterality: N/A;  . INSERT / REPLACE / REMOVE PACEMAKER    . MASS BIOPSY Left    neck mass  . MITRAL VALVE REPLACEMENT Right 07/17/2008   19mm Medtronic Mosaic porcine bioprosthesis  . PENILE PROSTHESIS IMPLANT    . RIGHT HEART CATHETERIZATION N/A 08/16/2013   Procedure: RIGHT HEART CATH;  Surgeon: Josue Hector, MD;  Location: [facility] CATH  LAB;  Service: Cardiovascular;  Laterality: N/A;  . TEE WITHOUT CARDIOVERSION N/A 09/06/2016   Procedure: TRANSESOPHAGEAL ECHOCARDIOGRAM (TEE);  Surgeon: Dorothy Spark, MD;  Location: [facility] ENDOSCOPY;  Service: Cardiovascular;  Laterality: N/A;  . THORACOTOMY Right 1970's   spontaneous pneumothorax - while in the [place]  . TOE SURGERY     left foot hammer toe  . TONSILLECTOMY    . VIDEO ASSISTED THORACOSCOPY (VATS)/WEDGE RESECTION Left 11/29/2013   Procedure: Video assisted thoracoscopy for wedge resection; mini thoracotomy;  Surgeon: [name], MD;  Location: [facility] OR;  Service: Thoracic;  Laterality: Left;     A IV Location/Drains/Wounds Patient Lines/Drains/Airways Status   Active Line/Drains/Airways    Name:   Placement date:   Placement time:   Site:   Days:   Peripheral IV 04/27/19 Left Antecubital  04/27/19    1440    Antecubital   less than 1          Intake/Output Last 24 hours  Intake/Output Summary (Last 24 hours) at 04/27/2019 1956 Last data filed at 04/27/2019 1925 Gross per 24 hour  Intake -  Output 900 ml  Net -900 ml    Labs/Imaging Results for orders placed or performed during the hospital encounter of 04/27/19 (from the past 48 hour(s))  CBC with Differential     Status: Abnormal   Collection Time: 04/27/19  3:02 PM  Result Value Ref Range   WBC 8.4 4.0 - 10.5 K/uL   RBC 3.98 (L) 4.22 - 5.81 MIL/uL   Hemoglobin 9.3 (L) 13.0 - 17.0 g/dL   HCT 31.6 (L) 39.0 - 52.0 %   MCV 79.4 (L) 80.0 - 100.0 fL   MCH 23.4 (L) 26.0 - 34.0 pg   MCHC 29.4 (L) 30.0 - 36.0 g/dL   RDW 18.3 (H) 11.5 - 15.5 %   Platelets 233 150 - 400 K/uL   nRBC 0.2 0.0 - 0.2 %   Neutrophils Relative % 79 %   Neutro Abs 6.8 1.7 - 7.7 K/uL   Lymphocytes Relative 7 %   Lymphs Abs 0.6 (L) 0.7 - 4.0 K/uL   Monocytes Relative 11 %   Monocytes Absolute 0.9 0.1 - 1.0 K/uL   Eosinophils Relative 1 %   Eosinophils Absolute 0.1 0.0 - 0.5 K/uL   Basophils Relative 1 %   Basophils Absolute  0.1 0.0 - 0.1 K/uL   Immature Granulocytes 1 %   Abs Immature Granulocytes 0.04 0.00 - 0.07 K/uL    Comment: Performed at [facility], [address], [place], [place] [address]  Basic metabolic panel     Status: Abnormal   Collection Time: 04/27/19  3:02 PM  Result Value Ref Range   Sodium 139 135 - 145 mmol/L   Potassium 4.4 3.5 - 5.1 mmol/L   Chloride 104 98 - 111 mmol/L   CO2 23 22 - 32 mmol/L   Glucose, Bld 139 (H) 70 - 99 mg/dL   BUN 25 (H) 8 - 23 mg/dL   Creatinine, Ser 1.18 0.61 - 1.24 mg/dL   Calcium 8.9 8.9 - 10.3 mg/dL   GFR calc non Af Amer >60 >60 mL/min   GFR calc Af Amer >60 >60 mL/min   Anion gap 12 5 - 15    Comment: Performed at [facility], [place] [address], [place], [place] [address]  Troponin I (High Sensitivity)     Status: Abnormal   Collection Time: 04/27/19  3:02 PM  Result Value Ref Range   Troponin I (High Sensitivity) 19 (H) <18 ng/L    Comment: (NOTE) Elevated high sensitivity troponin I (hsTnI) values and significant  changes across serial measurements may suggest ACS but many other  chronic and acute conditions are known to elevate hsTnI results.  Refer to the "Links" section for chest pain algorithms and additional  guidance. Performed at [facility], [place] [address], [place], [place] [address]   Brain natriuretic peptide     Status: Abnormal   Collection Time: 04/27/19  3:02 PM  Result Value Ref Range   B Natriuretic Peptide 630.0 (H) 0.0 - 100.0 pg/mL    Comment: Performed at [facility] [address] [place], [place] [address]   Dg Chest Portable 1 View  Result Date: 04/27/2019 CLINICAL DATA:  73 year old male with  history of chest pain. Dyspnea. EXAM: PORTABLE CHEST 1 VIEW COMPARISON:  Chest x-ray 08/17/2018. FINDINGS: Chronic elevation of the left hemidiaphragm. There is cephalization of the pulmonary vasculature, indistinctness of the interstitial markings, and patchy airspace disease throughout the lungs  bilaterally suggestive of moderate pulmonary edema. No definite pleural effusions. Mild cardiomegaly. Upper mediastinal contours are within normal limits. Right-sided biventricular pacemaker/AICD with lead tips projecting over the right ventricle and lateral wall the left ventricle via the coronary sinus and coronary veins. IMPRESSION: 1. The appearance the chest suggests congestive heart failure, as above. Electronically Signed   By: Vinnie Langton M.D.   On: 04/27/2019 15:27    Pending Labs Unresulted Labs (From admission, onward)    Start     Ordered   04/28/19 7619  Basic metabolic panel  Daily,   R     04/27/19 1937   04/28/19 0500  CBC  Tomorrow morning,   R     04/27/19 1937   04/28/19 0500  Magnesium  Tomorrow morning,   R     04/27/19 1937   04/27/19 1945  Vitamin B12  (Anemia Panel (PNL))  Add-on,   AD     04/27/19 1944   04/27/19 1945  Folate  (Anemia Panel (PNL))  Add-on,   AD     04/27/19 1944   04/27/19 1945  Iron and TIBC  (Anemia Panel (PNL))  Add-on,   AD     04/27/19 1944   04/27/19 1945  Ferritin  (Anemia Panel (PNL))  Add-on,   AD     04/27/19 1945   04/27/19 1945  Reticulocytes  (Anemia Panel (PNL))  Once,   STAT     04/27/19 1945   04/27/19 1452  SARS CORONAVIRUS 2 (TAT 6-24 HRS) Nasopharyngeal Nasopharyngeal Swab  (Asymptomatic/Tier 2 Patients Labs)  ONCE - STAT,   STAT    Question Answer Comment  Is this test for diagnosis or screening Screening   Symptomatic for COVID-19 as defined by CDC No   Hospitalized for COVID-19 No   Admitted to ICU for COVID-19 No   Previously tested for COVID-19 No   Resident in a congregate (group) care setting No   Employed in healthcare setting No      04/27/19 1451          Vitals/Pain Today's Vitals   04/27/19 1845 04/27/19 1900 04/27/19 1917 04/27/19 1930  BP: (!) 108/56 (!) 109/59    Pulse: 73 71    Resp:  18    Temp:      TempSrc:      SpO2: 91% 93%    Weight:      Height:      PainSc:   4  0-No pain     Isolation Precautions No active isolations  Medications Medications  nitroGLYCERIN (NITROSTAT) SL tablet 0.4 mg (has no administration in time range)  sodium chloride flush (NS) 0.9 % injection 3 mL (has no administration in time range)  sodium chloride flush (NS) 0.9 % injection 3 mL (has no administration in time range)  0.9 %  sodium chloride infusion (has no administration in time range)  acetaminophen (TYLENOL) tablet 650 mg (has no administration in time range)  ondansetron (ZOFRAN) injection 4 mg (has no administration in time range)  furosemide (LASIX) injection 40 mg (has no administration in time range)  potassium chloride 20 MEQ/15ML (10%) solution 20 mEq (has no administration in time range)  albuterol (VENTOLIN HFA) 108 (90 Base) MCG/ACT inhaler 2  puff (has no administration in time range)  aspirin EC tablet 81 mg (has no administration in time range)  atorvastatin (LIPITOR) tablet 10 mg (has no administration in time range)  mometasone-formoterol (DULERA) 200-5 MCG/ACT inhaler 2 puff (has no administration in time range)  carvedilol (COREG) tablet 3.125 mg (has no administration in time range)  irbesartan (AVAPRO) tablet 37.5 mg (has no administration in time range)  insulin aspart (novoLOG) injection 0-9 Units (has no administration in time range)  morphine 4 MG/ML injection 4 mg (4 mg Intravenous Given 04/27/19 1509)  methylPREDNISolone sodium succinate (SOLU-MEDROL) 125 mg/2 mL injection 80 mg (80 mg Intravenous Given 04/27/19 1509)  albuterol (VENTOLIN HFA) 108 (90 Base) MCG/ACT inhaler 2 puff (2 puffs Inhalation Given 04/27/19 1508)  furosemide (LASIX) injection 60 mg (60 mg Intravenous Given 04/27/19 1632)  potassium chloride SA (KLOR-CON) CR tablet 20 mEq (20 mEq Oral Given 04/27/19 1632)    Mobility walks with person assist Moderate fall risk   Focused Assessments Pulmonary Assessment Handoff:  Lung sounds: Bilateral Breath Sounds: Clear, Diminished L  Breath Sounds: Clear R Breath Sounds: Clear, Diminished O2 Device: Nasal Cannula O2 Flow Rate (L/min): 3 L/min      R Recommendations: See Admitting Provider Note  Report given to:   Additional Notes:  CHF exacerbation/observation

## 2019-04-27 NOTE — H&P (Signed)
History and Physical    [name]:[id number] DOB: May 25, 1946 DOA: 04/27/2019  PCP: [name], NP  Patient coming from: Home  I have personally briefly reviewed patient's old medical records in [place]  Chief Complaint: Shortness of breath  HPI: [name] is a 73 y.o. male with medical history significant for chronic combined systolic and diastolic CHF, permanent atrial fibrillation s/p AV nodal ablation and AICD, s/p mitral valve replacement with bioprosthetic valve, COPD on 2 L supplemental O2 via [place], history of CVA, history of CNS bleed, hypertension, hyperlipidemia, type 2 diabetes, history of lung cancer treated with wedge resection who presents to the ED for evaluation of dyspnea.  Patient reports 1 day of worsening dyspnea and orthopnea.  He has been having some lightheadedness without falls or syncope.  He has had some intermittent left-sided chest discomfort.  He says his doctor recently told him to increase his home O2 to 3 L supplemental O2 via [place] continuously at all times.  He has not noticed any increased swelling in his legs.  He has been taking Lasix 40 mg daily.  He denies any cough.  He denies any fevers, chills, diaphoresis, abdominal pain, dysuria.   ED Course:  Initial vitals showed BP 111/63, pulse 70, RR 19, temp 98.0 Fahrenheit, SPO2 90% on 3 L supplemental O2 via [place].  Per EDP O2 saturation dropped to 84% on 3 L O2 while standing.  Labs notable for WBC 8.4, hemoglobin 9.3, platelets 233,000, sodium 139, potassium 4.4, bicarb 23, BUN 25, creatinine 1.18, serum glucose 139, high-sensitivity troponin I 19, BNP 630.  Formal chest x-ray showed pulmonary edema bilaterally without definite pleural effusions.  Right-sided AICD is seen in place.  Patient was given IV Lasix 60 mg once, IV Solu-Medrol 80 mg once, oral potassium 20 mEq once, and albuterol inhaler treatment.  Hospitalist service was consulted admit for further evaluation and management.   Review of Systems: All systems reviewed and are negative except as documented in history of present illness above.   Past Medical History:  Diagnosis Date  . Atrial fibrillation ([place])    AV Node ablation January, 2010, for rapid atrial fib  . Atrial septal defect    Closed with surgery January, 2010  . Automatic implantable cardioverter-defibrillator in situ    LV dysfunction and pacer needed for AV node lesion  . Cardiomyopathy    non-ischemic  . CHF (congestive heart failure) ([place])   . Chronic combined systolic and diastolic CHF (congestive heart failure) ([place])   . Colon polyps   . COPD (chronic obstructive pulmonary disease) (HCC)    O2- 2 liters, nasal cannula, q night   . COPD GOLD II 01/11/2007   PFT's 09/25/13  FEV1  2.18 (61%) ratio 64 no change p B2 and DLC0  32% corrects to 68% - trial off advair and acei rec starting  08/23/2013     . CVA (cerebral vascular accident) ([place]) 2009   denies residual on 08/14/2013  . Diabetes mellitus without complication ([place])    type 2  . Dyslipidemia   . Dysrhythmia   . Ejection fraction < 50%   . Endocarditis    Bacterial, 2009  . Headache(784.0)    related to stroke only  . HLD (hyperlipidemia)   . Hypertension   . Intracranial hemorrhage (HCC)    Coumadin cannot be used because of the history of his bleed  . Lung cancer ([place]) 11/29/2013   T1N0 Stage Ia non-small cell carcinoma  left lung treated with wedge resection  . Mitral regurgitation    Severe symptomatic primary MR due to bacterial endocarditis, treated w/ MVR // Echo 1/18 EF 40-45, diffuse HK, dilated aorta at 42 mm/aortic root 47 mm; linear echogenic structure in ascending aorta - suspect reverberation artifact / consider CT or TEE to rule out dissection flap, bioprosthetic MVR with mean gradient 3, severe LAE, low normal RVSF, severe RAE, moderate TR, PASP 42  . Myocardial infarction ([name]) 2010  . Pacemaker    combo pacer and icd  . Permanent atrial fibrillation    Originally  Coumadin use for atrial fibrillation  //   he had intracerebral hemorrhage with an INR of 2.3 June, 2009. Anticoagulation could no longer be used.  //  Rapid atrial fibrillation after inferior MI October, 2010..........Marland Kitchen AV node ablation done at that time with ICD pacemaker placed (EF 35%).   //   Left atrial appendage tied off at the time of mitral valve surgery January, 2010 (maze pro  . Pneumonia 07/2018  . Prosthetic valve dysfunction    Mild mitral stenosis  . Pulmonary hypertension (HCC)    Moderate  . Renal artery stenosis (HCC)    Mild by history  . 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 Spontaneous pneumothorax    right thoracotomy - distant past  . Status post minimally invasive mitral valve replacement with bioprosthetic valve    33 mm Medtronic Mosaic porcine bioprosthesis placed via right mini thoracotomy for bacterial endocarditis complicated by severe MR and CHF   . Thoracic aortic aneurysm ([place]) 08/11/2016   a - Chest CTA 1/18:  Aneurysmal dilatation of aortic root is noted at 5.1 cm.     Past Surgical History:  Procedure Laterality Date  . APPENDECTOMY    . ASD REPAIR, SECUNDUM  07/17/2008   pericardial patch closure of ASD  . AV NODE ABLATION  07/2008   for rapid atrial fib  . CARDIAC CATHETERIZATION    . CARDIAC DEFIBRILLATOR PLACEMENT  ~ [address]  . CARDIAC VALVE REPLACEMENT    . CATARACT EXTRACTION W/ INTRAOCULAR LENS  IMPLANT, BILATERAL Bilateral   . ESOPHAGOGASTRODUODENOSCOPY (EGD) WITH PROPOFOL N/A 09/29/2016   Procedure: ESOPHAGOGASTRODUODENOSCOPY (EGD) WITH PROPOFOL;  Surgeon: [name], MD;  Location: [facility] ENDOSCOPY;  Service: Endoscopy;  Laterality: N/A;  . HERNIA REPAIR    . IMPLANTABLE CARDIOVERTER DEFIBRILLATOR (ICD) GENERATOR CHANGE N/A 02/06/2012   Procedure: ICD GENERATOR CHANGE;  Surgeon: [name], MD;  Location[facility] C[facility] LAB;  Service: Cardiovascular;  Laterality: N/A;  . INSERT / REPLACE / REMOVE PACEMAKER    . MASS BIOPSY Left    neck  mass  . MITRAL VALVE REPLACEMENT Right 07/17/2008   88mm Medtronic Mosaic porcine bioprosthesis  . PENILE PROSTHESIS IMPLANT    . RIGHT HEART CATHETERIZATION N/A 08/16/2013   Procedure: RIGHT HEART CATH;  Surgeon: Josue Hector, MD;  Location: [facility] CATH LAB;  Service: Cardiovascular;  Laterality: N/A;  . TEE WITHOUT CARDIOVERSION N/A 09/06/2016   Procedure: TRANSESOPHAGEAL ECHOCARDIOGRAM (TEE);  Surgeon: Dorothy Spark, MD;  Location: [facility] ENDOSCOPY;  Service: Cardiovascular;  Laterality: N/A;  . THORACOTOMY Right 1970's   spontaneous pneumothorax - while in the [place]  . TOE SURGERY     left foot hammer toe  . TONSILLECTOMY    . VIDEO ASSISTED THORACOSCOPY (VATS)/WEDGE RESECTION Left 11/29/2013   Procedure: Video assisted thoracoscopy for wedge resection; mini thoracotomy;  Surgeon: [name], MD;  Location[facility]  OR;  Service: Thoracic;  Laterality: Left;    Social History:  reports that he quit smoking about 5 years ago. His smoking use included cigarettes. He has a 45.00 pack-year smoking history. He has never used smokeless tobacco. He reports that he does not drink alcohol or use drugs.  Allergies  Allergen Reactions  . Anticoagulant Compound Other (See Comments)    Pt had intracranial bleed, therefore all anticoagulation is contraindicated per Dr. Ron Parker  . Other Other (See Comments)    Per Dr. Halford Chessman (Surgeon): stated that the patient cannot be put under for any surgery, as he has an enlarged aorta. He would stand only a 50/50 chance of surviving. He has lung issues, diminished lung tissue, COPD, and emphysema.  . Warfarin Sodium Other (See Comments)    Pt had intracranial bleed, therefore all anticoagulation is contraindicated per Dr. Ron Parker    Family History  Problem Relation Age of Onset  . Stomach cancer Father   . Stroke Mother      Prior to Admission medications   Medication Sig Start Date End Date Taking? Authorizing Provider  acetaminophen (TYLENOL) 325 MG  tablet Take 325-650 mg by mouth every 8 (eight) hours as needed for headache.   Yes [provider]  albuterol (PROVENTIL HFA) 108 (90 Base) MCG/ACT inhaler Inhale 2 puffs into the lungs every 6 (six) hours as needed for wheezing or shortness of breath.   Yes [provider]  amoxicillin (AMOXIL) 500 MG capsule Take 2,000 mg by mouth See admin instructions. Take 2,000 mg by mouth one hour prior to dental procedures   Yes [provider]  aspirin EC 81 MG tablet Take 1 tablet (81 mg total) by mouth daily. 12/07/12  Yes Carlena Bjornstad, MD  atorvastatin (LIPITOR) 10 MG tablet TAKE 1 TABLET BY MOUTH EVERY DAY Patient taking differently: Take 10 mg by mouth daily.  04/09/19  Yes Nafziger, Tommi Rumps, NP  budesonide-formoterol (SYMBICORT) 160-4.5 MCG/ACT inhaler INHALE 2 PUFFS INTO THE LUNGS TWICE A DAY Patient taking differently: Inhale 2 puffs into the lungs 2 (two) times daily.  10/22/18  Yes Lauraine Rinne, NP  carvedilol (COREG) 3.125 MG tablet TAKE 1 TABLET BY MOUTH TWICE A DAY WITH MEALS Patient taking differently: Take 3.125 mg by mouth 2 (two) times daily with a meal.  03/19/19  Yes Nafziger, Tommi Rumps, NP  furosemide (LASIX) 40 MG tablet TAKE 1 TABLET BY MOUTH EVERY DAY Patient taking differently: Take 40 mg by mouth daily.  04/09/19  Yes Nafziger, Tommi Rumps, NP  metFORMIN (GLUCOPHAGE) 1000 MG tablet TAKE 1 TABLET BY MOUTH TWICE A DAY WITH MEALS Patient taking differently: Take 1,000 mg by mouth 2 (two) times daily with a meal.  03/29/19  Yes Nafziger, Tommi Rumps, NP  nitroGLYCERIN (NITROSTAT) 0.4 MG SL tablet Place 0.4 mg under the tongue every 5 (five) minutes x 3 doses as needed for chest pain.    Yes [provider]  OXYGEN Inhale 2-3 L/min into the lungs continuous.    Yes [provider]  valsartan (DIOVAN) 40 MG tablet TAKE 1 TABLET BY MOUTH EVERY DAY Patient taking differently: Take 40 mg by mouth daily.  03/29/19  Yes Nafziger, Tommi Rumps, NP  budesonide-formoterol (SYMBICORT)  160-4.5 MCG/ACT inhaler Inhale 2 puffs into the lungs 2 (two) times daily. Patient not taking: Reported on 04/27/2019 04/11/19   Lauraine Rinne, NP  Olopatadine HCl (PAZEO) 0.7 % SOLN Apply 1 drop to eye daily. Patient not taking: Reported on 04/27/2019 01/03/18  [place], Betty G, MD    Physical Exam: Vitals:   04/27/19 1427 04/27/19 1430  BP:  111/63  Pulse:  71  Resp:  19  Temp:  98 F (36.7 C)  TempSrc:  Oral  SpO2:  90%  Weight: 73 kg   Height: 5\' 11"  (1.803 m)     Constitutional: Chronically ill-appearing man resting in bed with head elevated, NAD, calm, comfortable Eyes: PERRL, lids and conjunctivae normal ENMT: Mucous membranes are moist. Posterior pharynx clear of any exudate or lesions.  Neck: normal, supple, no masses. Respiratory: Bibasilar inspiratory crackles without obvious wheezing.  Normal respiratory effort. No accessory muscle use.  Cardiovascular: Regular rate and rhythm, no murmurs / rubs / gallops.  Trace lower extremity edema. 2+ pedal pulses. Abdomen: no tenderness, no masses palpated. No hepatosplenomegaly. Bowel sounds positive.  Musculoskeletal: no clubbing / cyanosis. No joint deformity upper and lower extremities. Good ROM, no contractures. Normal muscle tone.  Skin: no rashes, lesions, ulcers. No induration Neurologic: CN 2-12 grossly intact. Sensation intact, Strength 5/5 in all 4.  Psychiatric: Normal judgment and insight. Alert and oriented x 3. Normal mood.    Labs on Admission: I have personally reviewed following labs and imaging studies  CBC: Recent Labs  Lab 04/27/19 1502  WBC 8.4  NEUTROABS 6.8  HGB 9.3*  HCT 31.6*  MCV 79.4*  PLT 846   Basic Metabolic Panel: Recent Labs  Lab 04/27/19 1502  NA 139  K 4.4  CL 104  CO2 23  GLUCOSE 139*  BUN 25*  CREATININE 1.18  CALCIUM 8.9   GFR: Estimated Creatinine Clearance: 59.3 mL/min (by C-G formula based on SCr of 1.18 mg/dL). Liver Function Tests: No results for input(s): AST,  ALT, ALKPHOS, BILITOT, PROT, ALBUMIN in the last 168 hours. No results for input(s): LIPASE, AMYLASE in the last 168 hours. No results for input(s): AMMONIA in the last 168 hours. Coagulation Profile: No results for input(s): INR, PROTIME in the last 168 hours. Cardiac Enzymes: No results for input(s): CKTOTAL, CKMB, CKMBINDEX, TROPONINI in the last 168 hours. BNP (last 3 results) No results for input(s): PROBNP in the last 8760 hours. HbA1C: No results for input(s): HGBA1C in the last 72 hours. CBG: No results for input(s): GLUCAP in the last 168 hours. Lipid Profile: No results for input(s): CHOL, HDL, LDLCALC, TRIG, CHOLHDL, LDLDIRECT in the last 72 hours. Thyroid Function Tests: No results for input(s): TSH, T4TOTAL, FREET4, T3FREE, THYROIDAB in the last 72 hours. Anemia Panel: No results for input(s): VITAMINB12, FOLATE, FERRITIN, TIBC, IRON, RETICCTPCT in the last 72 hours. Urine analysis:    Component Value Date/Time   COLORURINE YELLOW 09/24/2015 1702   APPEARANCEUR CLEAR 09/24/2015 1702   LABSPEC 1.010 09/24/2015 1702   PHURINE 6.0 09/24/2015 1702   GLUCOSEU 100 (A) 09/24/2015 1702   HGBUR NEGATIVE 09/24/2015 1702   HGBUR negative 04/05/2010 0837   BILIRUBINUR NEGATIVE 09/24/2015 1702   BILIRUBINUR n 03/27/2012 1323   KETONESUR NEGATIVE 09/24/2015 1702   PROTEINUR NEGATIVE 09/24/2015 1702   UROBILINOGEN 1.0 11/28/2013 1000   NITRITE NEGATIVE 09/24/2015 1702   LEUKOCYTESUR NEGATIVE 09/24/2015 1702    Radiological Exams on Admission: Dg Chest Portable 1 View  Result Date: 04/27/2019 CLINICAL DATA:  73 year old male with history of chest pain. Dyspnea. EXAM: PORTABLE CHEST 1 VIEW COMPARISON:  Chest x-ray 08/17/2018. FINDINGS: Chronic elevation of the left hemidiaphragm. There is cephalization of the pulmonary vasculature, indistinctness of the interstitial markings, and patchy airspace disease throughout the lungs bilaterally  suggestive of moderate pulmonary edema. No  definite pleural effusions. Mild cardiomegaly. Upper mediastinal contours are within normal limits. Right-sided biventricular pacemaker/AICD with lead tips projecting over the right ventricle and lateral wall the left ventricle via the coronary sinus and coronary veins. IMPRESSION: 1. The appearance the chest suggests congestive heart failure, as above. Electronically Signed   By: Vinnie Langton M.D.   On: 04/27/2019 15:27    EKG: Independently reviewed.  Paced rhythm with questionable flutter waves.  Assessment/Plan Principal Problem:   Acute on chronic combined systolic (congestive) and diastolic (congestive) heart failure (HCC) Active Problems:   COPD GOLD II   Hypertension   Dyslipidemia   Permanent atrial fibrillation   ICD (implantable cardioverter-defibrillator), biventricular, in situ   S/P MVR (mitral valve replacement)   H/O atrioventricular nodal ablation   H/O: CVA (cerebrovascular accident)   Diabetes ([place])  SORIN FRIMPONG is a 73 y.o. male with medical history significant for chronic combined systolic and diastolic CHF, permanent atrial fibrillation s/p AV nodal ablation and AICD, s/p mitral valve replacement with bioprosthetic valve, COPD on 2 L supplemental O2 via [place], history of CVA, history of CNS bleed, hypertension, hyperlipidemia, type 2 diabetes, history of lung cancer treated with wedge resection who is admitted with acute on chronic combined systolic and diastolic CHF exacerbation.   Acute on chronic respiratory failure with hypoxia due to acute on chronic combined systolic diastolic CHF exacerbation: Echocardiogram 08/01/2018 showed EF 35-40% with diffuse hypokinesis.  Chest x-ray with pulmonary edema hydration.  He has begun to diurese well after receiving IV Lasix 60 mg in the ED. -Continue IV Lasix 40 mg twice daily -Monitor strict I/O's and daily weights -Continue home Coreg and valsartan -Update echocardiogram.  COPD: Recently increased to 3 L supplemental  O2 via [place] continuously at home.  Received IV Solu-Medrol in ED.  No wheezing at time of admission. -Continue supplemental O2 to maintain saturations 88-90% -Continue Symbicort and as needed albuterol  A. fib s/p AV nodal ablation and AICD: Not on anticoagulation due to history of CNS bleed.  Currently rate controlled, continue home Coreg.  Type 2 diabetes: A1c 7.5% on 04/12/2019.  On metformin at home. -Continue sensitive SSI while in hospital  Anemia: Hemoglobin slightly decreased relative to recent baseline.  No obvious bleeding noted.  Check anemia labs.  Hypertension: Currently stable.  Resume home Coreg and Lasix as above.  Will resume home irbesartan tomorrow.  Hyperlipidemia: Continue atorvastatin.  History of CVA: Continue aspirin 81 mg daily.   DVT prophylaxis: SCDs Code Status: DNR, confirmed with patient Family Communication: Discussed with patient Disposition Plan: Pending clinical progress Consults called: None Admission status: Observation   Zada Finders MD [facility]  If 7PM-7AM, please contact night-coverage [email or web address]  04/27/2019, 6:36 PM

## 2019-04-27 NOTE — ED Provider Notes (Signed)
[facility] EMERGENCY DEPARTMENT Provider Note   CSN: [id number] Arrival date & time: 04/27/19  1419     History   Chief Complaint Chief Complaint  Patient presents with  . Shortness of Breath    HPI [name] is a 73 y.o. male.  HPI   57yM with dyspnea. Acute on chronic.  He has underlying history of COPD. VATS with wedge resection LUL in 2015 for adenocarcinoma. He also has nonischemic cardiomyopathy with combined systolic/diastolic heart failure.  Bioprosthetic mitral valve.  Atrial fibrillation status post AV node ablation/pacemaker.  Previously the A. fib that could not be be rate controlled.  He has had a prior CNS bleed and had prior left atrial appendage tied so he is not on chronic anticoagulation.    He reports worsening shortness of breath over the past day or so.  He is normally on 2 to 3 L of oxygen at all times.  He reports a chronic cough without acute change.  Denies any acute pain.  Denies swelling.  Does state his breathing feels worse when laying back.  No fevers or chills.  Reports compliance with his medications.  Past Medical History:  Diagnosis Date  . Atrial fibrillation ([place])    AV Node ablation January, 2010, for rapid atrial fib  . Atrial septal defect    Closed with surgery January, 2010  . Automatic implantable cardioverter-defibrillator in situ    LV dysfunction and pacer needed for AV node lesion  . Cardiomyopathy    non-ischemic  . CHF (congestive heart failure) ([place])   . Chronic combined systolic and diastolic CHF (congestive heart failure) ([place])   . Colon polyps   . COPD (chronic obstructive pulmonary disease) (HCC)    O2- 2 liters, nasal cannula, q night   . COPD GOLD II 01/11/2007   PFT's 09/25/13  FEV1  2.18 (61%) ratio 64 no change p B2 and DLC0  32% corrects to 68% - trial off advair and acei rec starting  08/23/2013     . CVA (cerebral vascular accident) ([place]) 2009   denies residual on 08/14/2013  . Diabetes mellitus  without complication ([place])    type 2  . Dyslipidemia   . Dysrhythmia   . Ejection fraction < 50%   . Endocarditis    Bacterial, 2009  . Headache(784.0)    related to stroke only  . HLD (hyperlipidemia)   . Hypertension   . Intracranial hemorrhage (HCC)    Coumadin cannot be used because of the history of his bleed  . Lung cancer ([place]) 11/29/2013   T1N0 Stage Ia non-small cell carcinoma left lung treated with wedge resection  . Mitral regurgitation    Severe symptomatic primary MR due to bacterial endocarditis, treated w/ MVR // Echo 1/18 EF 40-45, diffuse HK, dilated aorta at 42 mm/aortic root 47 mm; linear echogenic structure in ascending aorta - suspect reverberation artifact / consider CT or TEE to rule out dissection flap, bioprosthetic MVR with mean gradient 3, severe LAE, low normal RVSF, severe RAE, moderate TR, PASP 42  . Myocardial infarction ([place]) 2010  . Pacemaker    combo pacer and icd  . Permanent atrial fibrillation    Originally Coumadin use for atrial fibrillation  //   he had intracerebral hemorrhage with an INR of 2.3 June, 2009. Anticoagulation could no longer be used.  //  Rapid atrial fibrillation after inferior MI October, 2010..........Marland Kitchen AV node ablation done at that time with ICD pacemaker placed (  EF 35%).   //   Left atrial appendage tied off at the time of mitral valve surgery January, 2010 (maze pro  . Pneumonia 07/2018  . Prosthetic valve dysfunction    Mild mitral stenosis  . Pulmonary hypertension (HCC)    Moderate  . Renal artery stenosis (HCC)    Mild by history  . 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 Spontaneous pneumothorax    right thoracotomy - distant past  . Status post minimally invasive mitral valve replacement with bioprosthetic valve    33 mm Medtronic Mosaic porcine bioprosthesis placed via right mini thoracotomy for bacterial endocarditis complicated by severe MR and CHF   . Thoracic aortic aneurysm ([place]) 08/11/2016   a - Chest CTA 1/18:   Aneurysmal dilatation of aortic root is noted at 5.1 cm.     Patient Active Problem List   Diagnosis Date Noted  . COPD with acute exacerbation ([place]) 08/01/2018  . Pulmonary edema 07/30/2018  . Health care maintenance 05/09/2018  . Gastritis and gastroduodenitis   . Diverticulum of duodenum   . Aortic root dilatation ([place])   . 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 Atherosclerosis of aorta ([place]) 08/11/2016  . Orthostatic hypotension 09/26/2015  . Near syncope 09/24/2015  . Syncope 09/24/2015  . Hepatic cirrhosis ([place]) 09/25/2014  . History of colonic polyps 09/25/2014  . Serum total bilirubin elevated 06/30/2014  . Follow-up examination, following unspecified surgery 01/27/2014  . Lung cancer ([place]) 11/29/2013  . Prosthetic valve dysfunction   . Chronic combined systolic and diastolic CHF (congestive heart failure) ([place])   . Diabetes ([place]) 11/04/2013  . Myocardial infarction ([place]) 11/01/2013  . Cardiomyopathy, nonischemic ([place]) 11/01/2013  . Acute on chronic respiratory failure ([place]) 08/15/2013  . H/O intracranial hemorrhage 08/13/2013  . H/O endocarditis 08/13/2013  . H/O: CVA (cerebrovascular accident) 08/13/2013  . Community acquired pneumonia 08/13/2013  . Chronic respiratory failure assoc with chf/ PAH 08/12/2013  . H/O atrioventricular nodal ablation   . S/P MVR (mitral valve replacement)   . Hypertension   . Dyslipidemia   . Permanent atrial fibrillation   . Ejection fraction < 50%   . ICD (implantable cardioverter-defibrillator), biventricular, in situ   . Renal artery stenosis ([place])   . Pulmonary hypertension ([place])   . Primary cancer of left upper lobe of lung ([place]) 04/20/2009  . COLONIC POLYPS, ADENOMATOUS 03/22/2007  . COPD GOLD II 01/11/2007  . GERD 01/11/2007    Past Surgical History:  Procedure Laterality Date  . APPENDECTOMY    . ASD REPAIR, SECUNDUM  07/17/2008   pericardial patch closure of ASD  . AV NODE ABLATION  07/2008   for rapid atrial fib  .  CARDIAC CATHETERIZATION    . CARDIAC DEFIBRILLATOR PLACEMENT  ~ [address] [employer]  . CARDIAC VALVE REPLACEMENT    . CATARACT EXTRACTION W/ INTRAOCULAR LENS  IMPLANT, BILATERAL Bilateral   . ESOPHAGOGASTRODUODENOSCOPY (EGD) WITH PROPOFOL N/A 09/29/2016   Procedure: ESOPHAGOGASTRODUODENOSCOPY (EGD) WITH PROPOFOL;  Surgeon: [name], MD;  Location: [facility] ENDOSCOPY;  Service: Endoscopy;  Laterality: N/A;  . HERNIA REPAIR    . IMPLANTABLE CARDIOVERTER DEFIBRILLATOR (ICD) GENERATOR CHANGE N/A 02/06/2012   Procedure: ICD GENERATOR CHANGE;  Surgeon: [name], MD;  Location: [facility] C[facility] LAB;  Service: Cardiovascular;  Laterality: N/A;  . INSERT / REPLACE / REMOVE PACEMAKER    . MASS BIOPSY Left    neck mass  . MITRAL VALVE REPLACEMENT Right 07/17/2008   39mm Medtronic Mosaic  porcine bioprosthesis  . PENILE PROSTHESIS IMPLANT    . RIGHT HEART CATHETERIZATION N/A 08/16/2013   Procedure: RIGHT HEART CATH;  Surgeon: Josue Hector, MD;  Location: [facility] CATH LAB;  Service: Cardiovascular;  Laterality: N/A;  . TEE WITHOUT CARDIOVERSION N/A 09/06/2016   Procedure: TRANSESOPHAGEAL ECHOCARDIOGRAM (TEE);  Surgeon: Dorothy Spark, MD;  Location: [facility] ENDOSCOPY;  Service: Cardiovascular;  Laterality: N/A;  . THORACOTOMY Right 1970's   spontaneous pneumothorax - while in the [place]  . TOE SURGERY     left foot hammer toe  . TONSILLECTOMY    . VIDEO ASSISTED THORACOSCOPY (VATS)/WEDGE RESECTION Left 11/29/2013   Procedure: Video assisted thoracoscopy for wedge resection; mini thoracotomy;  Surgeon: [name], MD;  Location: [facility];  Service: Thoracic;  Laterality: Left;        Home Medications    Prior to Admission medications   Medication Sig Start Date End Date Taking? Authorizing Provider  amoxicillin (AMOXIL) 500 MG capsule Take 2,000 mg by mouth See admin instructions. Take 2,000 mg by mouth one hour prior to dental procedures    [provider]  aspirin EC 81 MG tablet Take 1 tablet (81  mg total) by mouth daily. 12/07/12   Carlena Bjornstad, MD  atorvastatin (LIPITOR) 10 MG tablet TAKE 1 TABLET BY MOUTH EVERY DAY 04/09/19   Nafziger, Tommi Rumps, NP  budesonide-formoterol (SYMBICORT) 160-4.5 MCG/ACT inhaler INHALE 2 PUFFS INTO THE LUNGS TWICE A DAY 10/22/18   Lauraine Rinne, NP  budesonide-formoterol (SYMBICORT) 160-4.5 MCG/ACT inhaler Inhale 2 puffs into the lungs 2 (two) times daily. 04/11/19   Lauraine Rinne, NP  carvedilol (COREG) 3.125 MG tablet TAKE 1 TABLET BY MOUTH TWICE A DAY WITH MEALS 03/19/19   Nafziger, Tommi Rumps, NP  cetirizine (ZYRTEC) 10 MG tablet Take 10 mg by mouth daily.    [provider]  furosemide (LASIX) 40 MG tablet TAKE 1 TABLET BY MOUTH EVERY DAY 04/09/19   Nafziger, Tommi Rumps, NP  metFORMIN (GLUCOPHAGE) 1000 MG tablet TAKE 1 TABLET BY MOUTH TWICE A DAY WITH MEALS 03/29/19   Nafziger, Tommi Rumps, NP  nitroGLYCERIN (NITROSTAT) 0.4 MG SL tablet Place 0.4 mg under the tongue every 5 (five) minutes x 3 doses as needed for chest pain.     [provider]  Olopatadine HCl (PAZEO) 0.7 % SOLN Apply 1 drop to eye daily. Patient not taking: Reported on 04/25/2019 01/03/18   Martinique, Betty G, MD  OXYGEN Inhale 2 L into the lungs continuous.     [provider]  valsartan (DIOVAN) 40 MG tablet TAKE 1 TABLET BY MOUTH EVERY DAY 03/29/19   [name], NP    Family History Family History  Problem Relation Age of Onset  . Stomach cancer Father   . Stroke Mother     Social History Social History   Tobacco Use  . Smoking status: Former Smoker    Packs/day: 1.00    Years: 45.00    Pack years: 45.00    Types: Cigarettes    Quit date: 08/11/2013    Years since quitting: 5.7  . Smokeless tobacco: Never Used  . Tobacco comment: 08/14/2013 "quit smoking in 2009"  Substance Use Topics  . Alcohol use: No    Alcohol/week: 0.0 standard drinks    Comment: 08/14/2013 "used to drink beer; quit:in 1982"  . Drug use: No     Allergies   Anticoagulant compound, Other, and  Warfarin sodium   Review of Systems Review of Systems  All  systems reviewed and negative, other than as noted in HPI.  Physical Exam Updated Vital Signs BP 111/63   Pulse 71   Temp 98 F (36.7 C) (Oral)   Resp 19   Ht 5\' 11"  (1.803 m)   Wt 73 kg   SpO2 90%   BMI 22.45 kg/m   Physical Exam Vitals signs and nursing note reviewed.  Constitutional:      Appearance: He is well-developed.     Comments: Frail/chronically debilitated in appearance but not acutely distressed.  HENT:     Head: Normocephalic and atraumatic.  Eyes:     General:        Right eye: No discharge.        Left eye: No discharge.     Conjunctiva/sclera: Conjunctivae normal.  Neck:     Musculoskeletal: Neck supple.  Cardiovascular:     Rate and Rhythm: Normal rate and regular rhythm.     Heart sounds: Normal heart sounds. No murmur. No friction rub. No gallop.   Pulmonary:     Comments: Overall diminished with faint expiratory wheezing. Abdominal:     General: There is no distension.     Palpations: Abdomen is soft.     Tenderness: There is no abdominal tenderness.  Musculoskeletal:        General: No tenderness.  Skin:    General: Skin is warm and dry.  Neurological:     Mental Status: He is alert.  Psychiatric:        Behavior: Behavior normal.        Thought Content: Thought content normal.      ED Treatments / Results  Labs (all labs ordered are listed, but only abnormal results are displayed) Labs Reviewed  CBC WITH DIFFERENTIAL/PLATELET - Abnormal; Notable for the following components:      Result Value   RBC 3.98 (*)    Hemoglobin 9.3 (*)    HCT 31.6 (*)    MCV 79.4 (*)    MCH 23.4 (*)    MCHC 29.4 (*)    RDW 18.3 (*)    Lymphs Abs 0.6 (*)    All other components within normal limits  BASIC METABOLIC PANEL - Abnormal; Notable for the following components:   Glucose, Bld 139 (*)    BUN 25 (*)    All other components within normal limits  BRAIN NATRIURETIC PEPTIDE -  Abnormal; Notable for the following components:   B Natriuretic Peptide 630.0 (*)    All other components within normal limits  TROPONIN I (HIGH SENSITIVITY) - Abnormal; Notable for the following components:   Troponin I (High Sensitivity) 19 (*)    All other components within normal limits  SARS CORONAVIRUS 2 (TAT 6-24 HRS)  TROPONIN I (HIGH SENSITIVITY)    EKG None   EKG:  Rhythm: ventricular pacing with underlying atrial fibrillation Rate: 73 QRS: 156 ms QTc: 481 ms ST segments: NS ST changes   Radiology Dg Chest Portable 1 View  Result Date: 04/27/2019 CLINICAL DATA:  73 year old male with history of chest pain. Dyspnea. EXAM: PORTABLE CHEST 1 VIEW COMPARISON:  Chest x-ray 08/17/2018. FINDINGS: Chronic elevation of the left hemidiaphragm. There is cephalization of the pulmonary vasculature, indistinctness of the interstitial markings, and patchy airspace disease throughout the lungs bilaterally suggestive of moderate pulmonary edema. No definite pleural effusions. Mild cardiomegaly. Upper mediastinal contours are within normal limits. Right-sided biventricular pacemaker/AICD with lead tips projecting over the right ventricle and lateral wall the left ventricle via the  coronary sinus and coronary veins. IMPRESSION: 1. The appearance the chest suggests congestive heart failure, as above. Electronically Signed   By: Vinnie Langton M.D.   On: 04/27/2019 15:27    Procedures Procedures (including critical care time)  Medications Ordered in ED Medications  nitroGLYCERIN (NITROSTAT) SL tablet 0.4 mg (has no administration in time range)  morphine 4 MG/ML injection 4 mg (has no administration in time range)  methylPREDNISolone sodium succinate (SOLU-MEDROL) 125 mg/2 mL injection 80 mg (has no administration in time range)  albuterol (VENTOLIN HFA) 108 (90 Base) MCG/ACT inhaler 2 puff (has no administration in time range)     Initial Impression / Assessment and Plan / ED Course   I have reviewed the triage vital signs and the nursing notes.  Pertinent labs & imaging results that were available during my care of the patient were reviewed by me and considered in my medical decision making (see chart for details).       73 year old male with acute on chronic dyspnea.  Some faint wheezing on exam but suspect acute symptoms more from HF exacerbation.  BNP elevated.  Chest x-ray as above.  He was given Lasix and is beginning to diurese.  He desaturated to 84% when just standing at the bedside to urinate.  This was while he was on 3 L of oxygen.   Final Clinical Impressions(s) / ED Diagnoses   Final diagnoses:  Acute on chronic combined systolic and diastolic congestive heart failure [facility])    ED Discharge Orders    None       Virgel Manifold, MD 04/27/19 [phone]

## 2019-04-27 NOTE — ED Triage Notes (Signed)
Pt states he started having increased shortness of breath since earlier today.  Report hx of COPD and lung CA.  Pt normally on 2L [place] at home

## 2019-04-28 ENCOUNTER — Observation Stay (HOSPITAL_BASED_OUTPATIENT_CLINIC_OR_DEPARTMENT_OTHER): Payer: Medicare Other

## 2019-04-28 ENCOUNTER — Encounter (HOSPITAL_COMMUNITY): Payer: Self-pay

## 2019-04-28 DIAGNOSIS — R0602 Shortness of breath: Secondary | ICD-10-CM | POA: Diagnosis not present

## 2019-04-28 DIAGNOSIS — I5043 Acute on chronic combined systolic (congestive) and diastolic (congestive) heart failure: Secondary | ICD-10-CM | POA: Diagnosis present

## 2019-04-28 DIAGNOSIS — I10 Essential (primary) hypertension: Secondary | ICD-10-CM | POA: Diagnosis not present

## 2019-04-28 DIAGNOSIS — I4892 Unspecified atrial flutter: Secondary | ICD-10-CM | POA: Diagnosis present

## 2019-04-28 DIAGNOSIS — Z952 Presence of prosthetic heart valve: Secondary | ICD-10-CM | POA: Diagnosis not present

## 2019-04-28 DIAGNOSIS — Z66 Do not resuscitate: Secondary | ICD-10-CM | POA: Diagnosis present

## 2019-04-28 DIAGNOSIS — I252 Old myocardial infarction: Secondary | ICD-10-CM | POA: Diagnosis not present

## 2019-04-28 DIAGNOSIS — I712 Thoracic aortic aneurysm, without rupture: Secondary | ICD-10-CM | POA: Diagnosis present

## 2019-04-28 DIAGNOSIS — I4821 Permanent atrial fibrillation: Secondary | ICD-10-CM | POA: Diagnosis present

## 2019-04-28 DIAGNOSIS — N182 Chronic kidney disease, stage 2 (mild): Secondary | ICD-10-CM | POA: Diagnosis present

## 2019-04-28 DIAGNOSIS — I361 Nonrheumatic tricuspid (valve) insufficiency: Secondary | ICD-10-CM | POA: Diagnosis not present

## 2019-04-28 DIAGNOSIS — D509 Iron deficiency anemia, unspecified: Secondary | ICD-10-CM | POA: Diagnosis present

## 2019-04-28 DIAGNOSIS — I2510 Atherosclerotic heart disease of native coronary artery without angina pectoris: Secondary | ICD-10-CM | POA: Diagnosis present

## 2019-04-28 DIAGNOSIS — Z8673 Personal history of transient ischemic attack (TIA), and cerebral infarction without residual deficits: Secondary | ICD-10-CM | POA: Diagnosis not present

## 2019-04-28 DIAGNOSIS — E1122 Type 2 diabetes mellitus with diabetic chronic kidney disease: Secondary | ICD-10-CM | POA: Diagnosis present

## 2019-04-28 DIAGNOSIS — J449 Chronic obstructive pulmonary disease, unspecified: Secondary | ICD-10-CM | POA: Diagnosis present

## 2019-04-28 DIAGNOSIS — I5084 End stage heart failure: Secondary | ICD-10-CM | POA: Diagnosis present

## 2019-04-28 DIAGNOSIS — Q2543 Congenital aneurysm of aorta: Secondary | ICD-10-CM | POA: Diagnosis not present

## 2019-04-28 DIAGNOSIS — E785 Hyperlipidemia, unspecified: Secondary | ICD-10-CM | POA: Diagnosis present

## 2019-04-28 DIAGNOSIS — E119 Type 2 diabetes mellitus without complications: Secondary | ICD-10-CM | POA: Diagnosis not present

## 2019-04-28 DIAGNOSIS — Z95810 Presence of automatic (implantable) cardiac defibrillator: Secondary | ICD-10-CM | POA: Diagnosis not present

## 2019-04-28 DIAGNOSIS — J9621 Acute and chronic respiratory failure with hypoxia: Secondary | ICD-10-CM | POA: Diagnosis present

## 2019-04-28 DIAGNOSIS — Z9889 Other specified postprocedural states: Secondary | ICD-10-CM | POA: Diagnosis not present

## 2019-04-28 DIAGNOSIS — I130 Hypertensive heart and chronic kidney disease with heart failure and stage 1 through stage 4 chronic kidney disease, or unspecified chronic kidney disease: Secondary | ICD-10-CM | POA: Diagnosis present

## 2019-04-28 DIAGNOSIS — R531 Weakness: Secondary | ICD-10-CM | POA: Diagnosis not present

## 2019-04-28 DIAGNOSIS — I701 Atherosclerosis of renal artery: Secondary | ICD-10-CM | POA: Diagnosis present

## 2019-04-28 DIAGNOSIS — I959 Hypotension, unspecified: Secondary | ICD-10-CM | POA: Diagnosis present

## 2019-04-28 DIAGNOSIS — Q2549 Other congenital malformations of aorta: Secondary | ICD-10-CM | POA: Diagnosis not present

## 2019-04-28 DIAGNOSIS — I428 Other cardiomyopathies: Secondary | ICD-10-CM | POA: Diagnosis present

## 2019-04-28 DIAGNOSIS — Z20828 Contact with and (suspected) exposure to other viral communicable diseases: Secondary | ICD-10-CM | POA: Diagnosis present

## 2019-04-28 DIAGNOSIS — I272 Pulmonary hypertension, unspecified: Secondary | ICD-10-CM | POA: Diagnosis present

## 2019-04-28 DIAGNOSIS — D638 Anemia in other chronic diseases classified elsewhere: Secondary | ICD-10-CM | POA: Diagnosis present

## 2019-04-28 LAB — BASIC METABOLIC PANEL
Anion gap: 12 (ref 5–15)
BUN: 28 mg/dL — ABNORMAL HIGH (ref 8–23)
CO2: 25 mmol/L (ref 22–32)
Calcium: 8.8 mg/dL — ABNORMAL LOW (ref 8.9–10.3)
Chloride: 102 mmol/L (ref 98–111)
Creatinine, Ser: 1.08 mg/dL (ref 0.61–1.24)
GFR calc Af Amer: >60 mL/min (ref >60)
GFR calc non Af Amer: >60 mL/min (ref >60)
Glucose, Bld: 208 mg/dL — ABNORMAL HIGH (ref 70–99)
Potassium: 3.8 mmol/L (ref 3.5–5.1)
Sodium: 139 mmol/L (ref 135–145)

## 2019-04-28 LAB — ECHOCARDIOGRAM COMPLETE
Height: 71 in
Weight: 2536 oz

## 2019-04-28 LAB — CBC
HCT: 31.4 % — ABNORMAL LOW (ref 39.0–52.0)
Hemoglobin: 8.9 g/dL — ABNORMAL LOW (ref 13.0–17.0)
MCH: 22.1 pg — ABNORMAL LOW (ref 26.0–34.0)
MCHC: 28.3 g/dL — ABNORMAL LOW (ref 30.0–36.0)
MCV: 78.1 fL — ABNORMAL LOW (ref 80.0–100.0)
Platelets: 229 10*3/uL (ref 150–400)
RBC: 4.02 MIL/uL — ABNORMAL LOW (ref 4.22–5.81)
RDW: 18.2 % — ABNORMAL HIGH (ref 11.5–15.5)
WBC: 6.8 10*3/uL (ref 4.0–10.5)
nRBC: 0.3 % — ABNORMAL HIGH (ref 0.0–0.2)

## 2019-04-28 LAB — GLUCOSE, CAPILLARY
Glucose-Capillary: 207 mg/dL — ABNORMAL HIGH (ref 70–99)
Glucose-Capillary: 181 mg/dL — ABNORMAL HIGH (ref 70–99)
Glucose-Capillary: 192 mg/dL — ABNORMAL HIGH (ref 70–99)
Glucose-Capillary: 143 mg/dL — ABNORMAL HIGH (ref 70–99)

## 2019-04-28 LAB — MAGNESIUM: Magnesium: 1.9 mg/dL (ref 1.7–2.4)

## 2019-04-28 MED ORDER — ENOXAPARIN SODIUM 40 MG/0.4ML ~~LOC~~ SOLN
40.0000 mg | SUBCUTANEOUS | Status: DC
  Administered 2019-04-28 – 2019-05-02 (×5): 40 mg via SUBCUTANEOUS
  Filled 2019-04-28 (×5): qty 0.4

## 2019-04-28 MED ORDER — SODIUM CHLORIDE 0.9 % IV SOLN
510.0000 mg | Freq: Once | INTRAVENOUS | Status: AC
  Administered 2019-04-28: 510 mg via INTRAVENOUS
  Filled 2019-04-28: qty 17

## 2019-04-28 MED ORDER — ORAL CARE MOUTH RINSE
15.0000 mL | Freq: Two times a day (BID) | OROMUCOSAL | Status: DC
  Administered 2019-04-28 – 2019-05-02 (×7): 15 mL via OROMUCOSAL

## 2019-04-28 MED ORDER — INSULIN GLARGINE 100 UNIT/ML ~~LOC~~ SOLN
12.0000 [IU] | Freq: Every day | SUBCUTANEOUS | Status: DC
  Administered 2019-04-28 – 2019-05-02 (×5): 12 [IU] via SUBCUTANEOUS
  Filled 2019-04-28 (×5): qty 0.12

## 2019-04-28 NOTE — Progress Notes (Signed)
PROGRESS NOTE    TOLBERT MATHESON  ZOX:[id number] DOB: [birth year] DOA: 04/27/2019 PCP: [name], NP  Brief Narrative: Charles SCHUNK is a 73 y.o. male with medical history significant for chronic combined systolic and diastolic CHF, permanent atrial fibrillation s/p AV nodal ablation and AICD, s/p mitral valve replacement with bioprosthetic valve, COPD on 2 L supplemental O2 via [place], history of CVA, history of CNS bleed, hypertension, hyperlipidemia, type 2 diabetes, history of lung cancer treated with wedge resection who presented to the ED for evaluation of dyspnea. in ED O2 saturation dropped to 84% on 3 L O2 while standing. - chest x-ray showed pulmonary edema bilaterally    Assessment & Plan:   Acute on chronic respiratory failure with hypoxia Acute on chronic combined systolic diastolic CHF exacerbation: Echocardiogram 08/01/2018 showed EF 35-40% with diffuse hypokinesis.  -Chest x-ray on admission with bilateral pulmonary edema -Remains dyspneic this morning with bibasilar crackles on exam, continue IV Lasix 40 mg every 12 -Continue Coreg, and soft blood pressures will hold ARB today -Follow-up repeat echocardiogram -Monitor I's/O, daily weights -Out of bed, ambulate  COPD/chronic respiratory failure -On 2 to 3 L home O2 at baseline: -No wheezing at this time -Continue Symbicort and as needed albuterol  A. fib s/p AV nodal ablation and AICD: Not on anticoagulation due to history of CNS bleed.   -Controlled, continueCoreg.  Type 2 diabetes: A1c 7.5% on 04/12/2019.  On metformin at home. -CBGs high now, worsening did receive a dose of Solu-Medrol in the ER last night -Continue sliding scale, add Lantus today  Iron deficiency anemia -Trend hemoglobin noted from baseline, anemia panel with severe iron deficiency will give IV iron x1 now, patient denies any overt bleeding or blood loss recommend gastroenterology follow-up  Hypertension: Currently stable.  Restart  Coreg, hold ARB  Hyperlipidemia: Continue atorvastatin.  History of CVA: Continue aspirin 81 mg daily.   DVT prophylaxis:  Add Lovenox Code Status: DNR Family Communication: Discussed with patient Disposition Plan:  Pending clinical improvement   Consultants:      Procedures:   Antimicrobials:    Subjective: -Complains of shortness of breath, feels a little better from yesterday but not close to baseline yet  Objective: Vitals:   04/27/19 2125 04/28/19 0538 04/28/19 0803 04/28/19 0902  BP: 117/65 105/68 (!) 103/58   Pulse: 69 72 72   Resp:  18 18   Temp:  98.4 F (36.9 C) 97.6 F (36.4 C)   TempSrc:  Oral Oral   SpO2:  90% 96% 95%  Weight:  71.9 kg    Height:        Intake/Output Summary (Last 24 hours) at 04/28/2019 1137 Last data filed at 04/28/2019 1048 Gross per 24 hour  Intake 720 ml  Output 3725 ml  Net -3005 ml   Filed Weights   04/27/19 1427 04/27/19 2030 04/28/19 0538  Weight: 73 kg 72.6 kg 71.9 kg    Examination:  General exam: Really chronically ill male, sitting up in bed, no distress Respiratory system: Bilateral basilar crackles noted  cardiovascular system: S1 & S2 heard, RRR.   Gastrointestinal system: Abdomen is nondistended, soft and nontender.Normal bowel sounds heard. Central nervous system: Alert and oriented. No focal neurological deficits. Extremities: No edema Skin: No rashes, lesions or ulcers Psychiatry:  Mood & affect appropriate.     Data Reviewed:   CBC: Recent Labs  Lab 04/27/19 1502 04/28/19 0617  WBC 8.4 6.8  NEUTROABS 6.8  --   HGB 9.3*  8.9*  HCT 31.6* 31.4*  MCV 79.4* 78.1*  PLT 233 762   Basic Metabolic Panel: Recent Labs  Lab 04/27/19 1502 04/28/19 0617  NA 139 139  K 4.4 3.8  CL 104 102  CO2 23 25  GLUCOSE 139* 208*  BUN 25* 28*  CREATININE 1.18 1.08  CALCIUM 8.9 8.8*  MG  --  1.9   GFR: Estimated Creatinine Clearance: 63.8 mL/min (by C-G formula based on SCr of 1.08 mg/dL).  Liver Function Tests: No results for input(s): AST, ALT, ALKPHOS, BILITOT, PROT, ALBUMIN in the last 168 hours. No results for input(s): LIPASE, AMYLASE in the last 168 hours. No results for input(s): AMMONIA in the last 168 hours. Coagulation Profile: No results for input(s): INR, PROTIME in the last 168 hours. Cardiac Enzymes: No results for input(s): CKTOTAL, CKMB, CKMBINDEX, TROPONINI in the last 168 hours. BNP (last 3 results) No results for input(s): PROBNP in the last 8760 hours. HbA1C: No results for input(s): HGBA1C in the last 72 hours. CBG: Recent Labs  Lab 04/27/19 2204 04/28/19 0633  GLUCAP 399* 207*   Lipid Profile: No results for input(s): CHOL, HDL, LDLCALC, TRIG, CHOLHDL, LDLDIRECT in the last 72 hours. Thyroid Function Tests: No results for input(s): TSH, T4TOTAL, FREET4, T3FREE, THYROIDAB in the last 72 hours. Anemia Panel: Recent Labs    04/27/19 2034  VITAMINB12 88*  FOLATE 19.5  FERRITIN 10*  TIBC 442  IRON 22*  RETICCTPCT 2.2   Urine analysis:    Component Value Date/Time   COLORURINE YELLOW 09/24/2015 1702   APPEARANCEUR CLEAR 09/24/2015 1702   LABSPEC 1.010 09/24/2015 1702   PHURINE 6.0 09/24/2015 1702   GLUCOSEU 100 (A) 09/24/2015 1702   HGBUR NEGATIVE 09/24/2015 1702   HGBUR negative 04/05/2010 0837   BILIRUBINUR NEGATIVE 09/24/2015 1702   BILIRUBINUR n 03/27/2012 1323   KETONESUR NEGATIVE 09/24/2015 1702   PROTEINUR NEGATIVE 09/24/2015 1702   UROBILINOGEN 1.0 11/28/2013 1000   NITRITE NEGATIVE 09/24/2015 1702   LEUKOCYTESUR NEGATIVE 09/24/2015 1702   Sepsis Labs: @LABRCNTIP (procalcitonin:4,lacticidven:4)  ) Recent Results (from the past 240 hour(s))  SARS CORONAVIRUS 2 (TAT 6-24 HRS) Nasopharyngeal Nasopharyngeal Swab     Status: None   Collection Time: 04/27/19  3:02 PM   Specimen: Nasopharyngeal Swab  Result Value Ref Range Status   SARS Coronavirus 2 NEGATIVE NEGATIVE Final    Comment: (NOTE) SARS-CoV-2 target nucleic  acids are NOT DETECTED. The SARS-CoV-2 RNA is generally detectable in upper and lower respiratory specimens during the acute phase of infection. Negative results do not preclude SARS-CoV-2 infection, do not rule out co-infections with other pathogens, and should not be used as the sole basis for treatment or other patient management decisions. Negative results must be combined with clinical observations, patient history, and epidemiological information. The expected result is Negative. Fact Sheet for Patients: SugarRoll.be Fact Sheet for Healthcare Providers: https://www.woods-mathews.com/ This test is not yet approved or cleared by the [place] FDA and  has been authorized for detection and/or diagnosis of SARS-CoV-2 by FDA under an Emergency Use Authorization (EUA). This EUA will remain  in effect (meaning this test can be used) for the duration of the COVID-19 declaration under Section 56 4(b)(1) of the Act, 21 U.S.C. section 360bbb-3(b)(1), unless the authorization is terminated or revoked sooner. Performed at [facility], [place] [address], [place], [place] [address]          Radiology Studies: Dg Chest Portable 1 View  Result Date: 04/27/2019 CLINICAL DATA:  73 year old male with history of chest pain. Dyspnea. EXAM: PORTABLE CHEST 1 VIEW COMPARISON:  Chest x-ray 08/17/2018. FINDINGS: Chronic elevation of the left hemidiaphragm. There is cephalization of the pulmonary vasculature, indistinctness of the interstitial markings, and patchy airspace disease throughout the lungs bilaterally suggestive of moderate pulmonary edema. No definite pleural effusions. Mild cardiomegaly. Upper mediastinal contours are within normal limits. Right-sided biventricular pacemaker/AICD with lead tips projecting over the right ventricle and lateral wall the left ventricle via the coronary sinus and coronary veins. IMPRESSION: 1. The appearance the  chest suggests congestive heart failure, as above. Electronically Signed   By: Vinnie Langton M.D.   On: 04/27/2019 15:27        Scheduled Meds: . aspirin EC  81 mg Oral Daily  . atorvastatin  10 mg Oral Daily  . carvedilol  3.125 mg Oral BID WC  . furosemide  40 mg Intravenous Q12H  . insulin aspart  0-9 Units Subcutaneous TID WC  . irbesartan  37.5 mg Oral Daily  . mouth rinse  15 mL Mouth Rinse BID  . mometasone-formoterol  2 puff Inhalation BID  . potassium chloride  20 mEq Oral Daily  . sodium chloride flush  3 mL Intravenous Q12H   Continuous Infusions: . sodium chloride 250 mL (04/28/19 0852)     LOS: 0 days    Time spent: 48min   Domenic Polite, MD [facility]   04/28/2019, 11:37 AM

## 2019-04-28 NOTE — Plan of Care (Signed)
  Problem: Education: Goal: Knowledge of General Education information will improve Description: Including pain rating scale, medication(s)/side effects and non-pharmacologic comfort measures Outcome: Progressing   Problem: Health Behavior/Discharge Planning: Goal: Ability to manage health-related needs will improve Outcome: Progressing   Problem: Clinical Measurements: Goal: Ability to maintain clinical measurements within normal limits will improve Outcome: Progressing   Problem: Activity: Goal: Risk for activity intolerance will decrease Outcome: Progressing   Problem: Nutrition: Goal: Adequate nutrition will be maintained Outcome: Progressing   Problem: Elimination: Goal: Will not experience complications related to bowel motility Outcome: Progressing Goal: Will not experience complications related to urinary retention Outcome: Progressing   Problem: Safety: Goal: Ability to remain free from injury will improve Outcome: Progressing   Problem: Skin Integrity: Goal: Risk for impaired skin integrity will decrease Outcome: Progressing

## 2019-04-28 NOTE — Progress Notes (Signed)
Paged MD about low BP 89/42. Per MD hold carvedilol 5pm dose and evening lasix dose.

## 2019-04-28 NOTE — Progress Notes (Signed)
  Echocardiogram 2D Echocardiogram has been performed.  Fawnda Vitullo L Androw 04/28/2019, 8:51 AM

## 2019-04-29 DIAGNOSIS — Z952 Presence of prosthetic heart valve: Secondary | ICD-10-CM

## 2019-04-29 DIAGNOSIS — E785 Hyperlipidemia, unspecified: Secondary | ICD-10-CM | POA: Diagnosis not present

## 2019-04-29 DIAGNOSIS — I4821 Permanent atrial fibrillation: Secondary | ICD-10-CM

## 2019-04-29 DIAGNOSIS — I5043 Acute on chronic combined systolic (congestive) and diastolic (congestive) heart failure: Secondary | ICD-10-CM | POA: Diagnosis not present

## 2019-04-29 DIAGNOSIS — J449 Chronic obstructive pulmonary disease, unspecified: Secondary | ICD-10-CM

## 2019-04-29 DIAGNOSIS — J9621 Acute and chronic respiratory failure with hypoxia: Secondary | ICD-10-CM

## 2019-04-29 DIAGNOSIS — Z9889 Other specified postprocedural states: Secondary | ICD-10-CM

## 2019-04-29 DIAGNOSIS — Z8673 Personal history of transient ischemic attack (TIA), and cerebral infarction without residual deficits: Secondary | ICD-10-CM

## 2019-04-29 DIAGNOSIS — Z95810 Presence of automatic (implantable) cardiac defibrillator: Secondary | ICD-10-CM

## 2019-04-29 LAB — BASIC METABOLIC PANEL
Anion gap: 8 (ref 5–15)
BUN: 27 mg/dL — ABNORMAL HIGH (ref 8–23)
CO2: 29 mmol/L (ref 22–32)
Calcium: 8.6 mg/dL — ABNORMAL LOW (ref 8.9–10.3)
Chloride: 102 mmol/L (ref 98–111)
Creatinine, Ser: 1.14 mg/dL (ref 0.61–1.24)
GFR calc Af Amer: >60 mL/min (ref >60)
GFR calc non Af Amer: >60 mL/min (ref >60)
Glucose, Bld: 113 mg/dL — ABNORMAL HIGH (ref 70–99)
Potassium: 4.4 mmol/L (ref 3.5–5.1)
Sodium: 139 mmol/L (ref 135–145)

## 2019-04-29 LAB — CBC
HCT: 33.3 % — ABNORMAL LOW (ref 39.0–52.0)
Hemoglobin: 9.4 g/dL — ABNORMAL LOW (ref 13.0–17.0)
MCH: 22.1 pg — ABNORMAL LOW (ref 26.0–34.0)
MCHC: 28.2 g/dL — ABNORMAL LOW (ref 30.0–36.0)
MCV: 78.4 fL — ABNORMAL LOW (ref 80.0–100.0)
Platelets: 219 10*3/uL (ref 150–400)
RBC: 4.25 MIL/uL (ref 4.22–5.81)
RDW: 18.3 % — ABNORMAL HIGH (ref 11.5–15.5)
WBC: 7.5 10*3/uL (ref 4.0–10.5)
nRBC: 0 % (ref 0.0–0.2)

## 2019-04-29 LAB — GLUCOSE, CAPILLARY
Glucose-Capillary: 131 mg/dL — ABNORMAL HIGH (ref 70–99)
Glucose-Capillary: 142 mg/dL — ABNORMAL HIGH (ref 70–99)
Glucose-Capillary: 235 mg/dL — ABNORMAL HIGH (ref 70–99)
Glucose-Capillary: 160 mg/dL — ABNORMAL HIGH (ref 70–99)
Glucose-Capillary: 164 mg/dL — ABNORMAL HIGH (ref 70–99)

## 2019-04-29 LAB — TROPONIN I (HIGH SENSITIVITY)
Troponin I (High Sensitivity): 15 ng/L (ref <18)
Troponin I (High Sensitivity): 16 ng/L (ref <18)

## 2019-04-29 MED ORDER — LIDOCAINE 5 % EX PTCH
1.0000 | MEDICATED_PATCH | CUTANEOUS | Status: DC
  Administered 2019-04-29 – 2019-05-01 (×3): 1 via TRANSDERMAL
  Filled 2019-04-29 (×3): qty 1

## 2019-04-29 NOTE — Progress Notes (Signed)
Per CCMD, at 14:27, patient heart rate dropped to 37 for 6 seconds. Upon assessment, patient's vitals are as follows:   04/29/19 1459  Vitals  Temp 98.4 F (36.9 C)  Temp Source Oral  BP (!) 97/55  MAP (mmHg) 68  BP Location Right Arm  BP Method Automatic  Patient Position (if appropriate) Lying  Pulse Rate 70  Pulse Rate Source Monitor  Resp 16  Oxygen Therapy  SpO2 95 %  O2 Device Nasal Cannula  O2 Flow Rate (L/min) 3 L/min  MEWS Score  MEWS RR 0  MEWS Pulse 0  MEWS Systolic 1  MEWS LOC 0  MEWS Temp 0  MEWS Score 1  MEWS Score Color Green   Patient asymptomatic, denies shortness of breath or dizziness. Patient's current monitor heart rate at 88. Patient is currently working with physical therapy. [place], [place] paged.

## 2019-04-29 NOTE — Progress Notes (Signed)
PROGRESS NOTE  POSEIDON PAM FBP:[id number] DOB: Oct 15, 1945 DOA: 04/27/2019 PCP: [name], NP  Brief History   [name] Cookis a 73 y.o.malewith medical history significant forchronic combined systolic and diastolic CHF, permanent atrial fibrillation s/p AV nodal ablation and AICD, s/p mitral valve replacement with bioprosthetic valve, COPD on 2 L supplemental O2 via [place], history of CVA, history of CNS bleed, hypertension, hyperlipidemia, type 2 diabetes, history of lung cancer treated with wedge resection who presented to the ED for evaluation ofdyspnea. In the  ED O2 saturation dropped to 84% on 3 L O2 while standing. Chest x-ray showed pulmonary edema bilaterally   Consultants   Cardiology  Procedures   None  Antibiotics   Anti-infectives (From admission, onward)   None      Subjective  The patient is resting comfortably. No new complaints.  Objective   Vitals:  Vitals:   04/29/19 1459 04/29/19 1617  BP: (!) 97/55 (!) 97/53  Pulse: 70 72  Resp: 16   Temp: 98.4 F (36.9 C)   SpO2: 95%    Exam:  Constitutional:   The patient is awake, alert, and oriented x 3. No acute distress. Eyes:   pupils and irises appear normal  Normal lids and conjunctivae ENMT:   grossly normal hearing   Lips appear normal  external ears, nose appear normal  Oropharynx: mucosa, tongue,posterior pharynx appear normal Neck:   neck appears normal, no masses, normal ROM, supple  no thyromegaly Respiratory:   No increased work of breathing.  No wheezes, rales, or rhonchi  No tactile fremitus Cardiovascular:   Regular rate and rhythm  No murmurs, ectopy, or gallups.  No lateral PMI. No thrills. Abdomen:   Abdomen is soft, non-tender, non-distended  No hernias, masses, or organomegaly  Normoactive bowel sounds.  Musculoskeletal:   No cyanosis, clubbing, or edema Skin:   No rashes, lesions, ulcers  palpation of skin: no induration or  nodules Neurologic:   CN 2-12 intact  Sensation all 4 extremities intact Psychiatric:   Mental status o Mood, affect appropriate o Orientation to person, place, time   judgment and insight appear intact  I have personally reviewed the following:   Today's Data   Vitals, BMP, CBC  Cardiology Data   Echocardiogram  Scheduled Meds:  aspirin EC  81 mg Oral Daily   atorvastatin  10 mg Oral Daily   carvedilol  3.125 mg Oral BID WC   enoxaparin (LOVENOX) injection  40 mg Subcutaneous Q24H   furosemide  40 mg Intravenous Q12H   insulin aspart  0-9 Units Subcutaneous TID WC   insulin glargine  12 Units Subcutaneous Daily   lidocaine  1 patch Transdermal Q24H   mouth rinse  15 mL Mouth Rinse BID   mometasone-formoterol  2 puff Inhalation BID   potassium chloride  20 mEq Oral Daily   sodium chloride flush  3 mL Intravenous Q12H   Continuous Infusions:  sodium chloride Stopped (04/28/19 1048)    Principal Problem:   Acute on chronic combined systolic (congestive) and diastolic (congestive) heart failure (HCC) Active Problems:   COPD GOLD II   Hypertension   Dyslipidemia   Permanent atrial fibrillation   ICD (implantable cardioverter-defibrillator), biventricular, in situ   S/P MVR (mitral valve replacement)   H/O atrioventricular nodal ablation   H/O: CVA (cerebrovascular accident)   Acute on chronic respiratory failure with hypoxia (HCC)   Diabetes ([place])   Acute on chronic combined systolic and diastolic CHF (congestive heart failure) (  [place])   LOS: 1 day   A & P  Acute on chronic respiratory failure with hypoxia due to acute on chronic combined systolic diastolic CHF exacerbation: Echocardiogram 08/01/2018 showed EF 35-40% with diffuse hypokinesis.Echo performed on 04/28/2019 demonstrates a reduction in EF to 25 - 30 % with global right ventricular hypokinesis. Bilateral pulmonary edema on CXR. Remains dyspneic this morning with bibasilar crackles on exam,  continue IV Lasix 40 mg every 12 hours. Continue Coreg, but ARB held due to hypotension. Per her Fluid balance the patient is down 2.295.5 L since admission. I appreciate cardiology's assistance.  COPD/chronic respiratory failure: the patient is on 2 to 3 L home O2 at baseline. He is currently saturating at 95% on 3Liters. Continue Symbicort and as needed albuterol  A. fib s/p AV nodal ablation and AICD:Not on anticoagulation due to history of CNS bleed.  Controlled, continue Coreg.  DM II: HbA1c 7.5% on 04/12/2019. On metformin at home. Glucoses for the last 24 hours have been 131- 235. The patient is receiving Lantus 12 units daily and SSI. Bump in glucoses may have been due to one dose of solumedrol the patient received in the ED on admission.  Anemia: Multifactorial. Iron deficiency and AOCD. The patient has received feraheme IV x 1. Monitor hemoglobin.   CKD: Creatinine at baseline at 1.14. Monitor creatinine, electrolytes, and volume status.  Hypertension: Currently stable on home dose of Coreg. ARB is held due to hypotension.  Hyperlipidemia: Continue atorvastatin.  History of CVA: Continue aspirin 81 mg daily.  I have seen and examined this patient myself. I have spent 35 minutes in his evaluation and care.  DVT prophylaxis: Add Lovenox Code Status:DNR Family Communication:Discussed with patient Disposition Plan: Pending clinical improvement   Elisah Parmer, DO [facility] Direct contact: see [email or web address]  7PM-7AM contact night coverage as above 04/29/2019, 4:49 PM  LOS: 1 day

## 2019-04-29 NOTE — TOC Initial Note (Signed)
Transition of Care [facility]) - Initial/Assessment Note    Patient Details  Name: [name] MRN: [id number] Date of Birth: 1945-12-14  Transition of Care [facility]) CM/SW Contact:    Alberteen Sam, [place] Lovelyn Sheeran Phone Number: [phone] 04/29/2019, 10:08 AM  Clinical Narrative:                  CSW met with patient at bedside for discharge planning and heart failure home health screen. Patient reports he lives at home with his wife and states that he has received calls from a nurse that provides check in's but is unsure of where she calls from. Patient reports he is declining any home health services at this time.   No further needs identified at this time. Please contact CSW should needs arise.   Expected Discharge Plan: Home/Self Care Barriers to Discharge: Continued Medical Work up   Patient Goals and CMS Choice Patient states their goals for this hospitalization and ongoing recovery are:: patient wishes to go home with wife CMS Medicare.gov Compare Post Acute Care list provided to:: Patient Choice offered to / list presented to : Patient  Expected Discharge Plan and Services Expected Discharge Plan: Home/Self Care       Living arrangements for the past 2 months: Single Family Home                                      Prior Living Arrangements/Services Living arrangements for the past 2 months: Single Family Home Lives with:: Spouse Patient language and need for interpreter reviewed:: Yes Do you feel safe going back to the place where you live?: Yes      Need for Family Participation in Patient Care: Yes (Comment) Care giver support system in place?: Yes (comment)   Criminal Activity/Legal Involvement Pertinent to Current Situation/Hospitalization: No - Comment as needed  Activities of Daily Living Home Assistive Devices/Equipment: Hearing aid, Eyeglasses, Oxygen ADL Screening (condition at time of admission) Patient's cognitive ability adequate to safely complete  daily activities?: Yes Is the patient deaf or have difficulty hearing?: Yes Does the patient have difficulty seeing, even when wearing glasses/contacts?: No Does the patient have difficulty concentrating, remembering, or making decisions?: No Patient able to express need for assistance with ADLs?: Yes Does the patient have difficulty dressing or bathing?: No Independently performs ADLs?: Yes (appropriate for developmental age) Does the patient have difficulty walking or climbing stairs?: No Weakness of Legs: None Weakness of Arms/Hands: None  Permission Sought/Granted Permission sought to share information with : Case Manager Permission granted to share information with : Yes, Verbal Permission Granted              Emotional Assessment Appearance:: Appears stated age Attitude/Demeanor/Rapport: Gracious Affect (typically observed): Calm Orientation: : Oriented to Self, Oriented to Place, Oriented to  Time, Oriented to Situation Alcohol / Substance Use: Not Applicable Psych Involvement: No (comment)  Admission diagnosis:  Acute on chronic combined systolic and diastolic congestive heart failure ([place]) [I50.43] Patient Active Problem List   Diagnosis Date Noted  . Acute on chronic combined systolic and diastolic CHF (congestive heart failure) ([place]) 04/28/2019  . Acute on chronic combined systolic (congestive) and diastolic (congestive) heart failure ([place]) 04/27/2019  . COPD with acute exacerbation ([place]) 08/01/2018  . Pulmonary edema 07/30/2018  . Health care maintenance 05/09/2018  . Gastritis and gastroduodenitis   . Diverticulum of duodenum   . Aortic root  dilatation ([place])   . 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 Atherosclerosis of aorta ([place]) 08/11/2016  . Orthostatic hypotension 09/26/2015  . Near syncope 09/24/2015  . Syncope 09/24/2015  . Hepatic cirrhosis ([place]) 09/25/2014  . History of colonic polyps 09/25/2014  . Serum total bilirubin elevated 06/30/2014  .  Follow-up examination, following unspecified surgery 01/27/2014  . Lung cancer ([place]) 11/29/2013  . Prosthetic valve dysfunction   . Chronic combined systolic and diastolic CHF (congestive heart failure) ([place])   . Diabetes ([place]) 11/04/2013  . Myocardial infarction ([place]) 11/01/2013  . Cardiomyopathy, nonischemic ([place]) 11/01/2013  . Acute on chronic respiratory failure with hypoxia ([place]) 08/15/2013  . H/O intracranial hemorrhage 08/13/2013  . H/O endocarditis 08/13/2013  . H/O: CVA (cerebrovascular accident) 08/13/2013  . Community acquired pneumonia 08/13/2013  . Chronic respiratory failure assoc with chf/ PAH 08/12/2013  . H/O atrioventricular nodal ablation   . S/P MVR (mitral valve replacement)   . Hypertension   . Dyslipidemia   . Permanent atrial fibrillation   . Ejection fraction < 50%   . ICD (implantable cardioverter-defibrillator), biventricular, in situ   . Renal artery stenosis ([place])   . Pulmonary hypertension ([place])   . Primary cancer of left upper lobe of lung ([place]) 04/20/2009  . COLONIC POLYPS, ADENOMATOUS 03/22/2007  . COPD GOLD II 01/11/2007  . GERD 01/11/2007   PCP:  [name], NP Pharmacy:   [id number] [place] [id number]: [phone]: [phone]    Social Determinants of Health (SDOH) Interventions    Readmission Risk Interventions No flowsheet data found.

## 2019-04-29 NOTE — Progress Notes (Signed)
Patients vitals are as follows after first nitroglycerin sublingual given:   04/29/19 1048  Vitals  BP (!) 80/55  MAP (mmHg) (!) 63  BP Method Automatic  Pulse Rate 72  Pulse Rate Source Monitor  Oxygen Therapy  SpO2 98 %  MEWS Score  MEWS RR 0  MEWS Pulse 0  MEWS Systolic 2  MEWS LOC 0  MEWS Temp 0  MEWS Score 2  MEWS Score Color Yellow   Chest pain rated at 6 out of 1-10 scale, patient describes chest pain as stabbing on the left side of chest. After five minutes, patient states pain level at an 8. Nurse paged Benny Lennert, MD. Second nitroglycerin sublingual not given due to drop in blood pressure. Tylenol was administered. EKG completed. Will monitor patients chest pain and vitals.

## 2019-04-29 NOTE — Evaluation (Signed)
Occupational Therapy Evaluation Patient Details Name: [name] MRN: [id number] DOB: [birth year] Today's Date: 04/29/2019    History of Present Illness 73 y.o. male with medical history significant for chronic combined systolic and diastolic CHF, permanent atrial fibrillation s/p AV nodal ablation and AICD, s/p mitral valve replacement with bioprosthetic valve, COPD on 2 L supplemental O2 via [place], history of CVA, history of CNS bleed, hypertension, hyperlipidemia, type 2 diabetes, history of lung cancer treated with wedge resection who presented to the ED for evaluation of dyspnea. in ED O2 saturation dropped to 84% on 3 L O2 while standing   Clinical Impression   Patient presenting with decreased I in self care, functional mobility/transfers, endurance, balance, and safety awareness. Patient reports being independent and living with wife PTA. Patient currently functioning at min A overall. Pt was on 3 L O2 via [place] and was at 91% after short distance ambulation in room. BP of 99/54 as well.  Patient will benefit from acute OT to increase overall independence in the areas of ADLs, functional mobility, and safety awareness in order to safely discharge home with caregiver.    Follow Up Recommendations  Home health OT;Supervision/Assistance - 24 hour (He will likely refuse HHOT recommendation at discharge)   Equipment Recommendations  Tub/shower seat    Recommendations for Other Services (none at this time)     Precautions / Restrictions Precautions Precautions: Fall Precaution Comments: Watch O2 and BP      Mobility Bed Mobility Overal bed mobility: Needs Assistance Bed Mobility: Rolling;Sit to Supine;Supine to Sit Rolling: Supervision   Supine to sit: Supervision Sit to supine: Supervision   General bed mobility comments: min cuing for technique and hand placement  Transfers Overall transfer level: Needs assistance Equipment used: 1 person hand held assist Transfers: Sit  to/from Stand;Stand Pivot Transfers Sit to Stand: Min guard Stand pivot transfers: Min assist;Min guard       General transfer comment: min A with mobility with ambulating 10' in room to recliner chair    Balance Overall balance assessment: Needs assistance Sitting-balance support: Feet supported Sitting balance-Leahy Scale: Good     Standing balance support: During functional activity Standing balance-Leahy Scale: Fair Standing balance comment: min A            ADL either performed or assessed with clinical judgement   ADL Overall ADL's : Needs assistance/impaired     Grooming: Wash/dry hands;Wash/dry face;Oral care;Sitting;Supervision/safety   Upper Body Bathing: Independent;Modified independent;Supervision/ safety;Set up   Lower Body Bathing: Min guard;Sit to/from stand   Upper Body Dressing : Set up;Sitting   Lower Body Dressing: Minimal assistance;Sit to/from stand   Toilet Transfer: Minimal assistance;Comfort height toilet   Toileting- Clothing Manipulation and Hygiene: Minimal assistance        Vision Baseline Vision/History: Wears glasses Wears Glasses: At all times Patient Visual Report: No change from baseline Vision Assessment?: No apparent visual deficits            Pertinent Vitals/Pain Pain Assessment: Faces Faces Pain Scale: Hurts a little bit Pain Location: chest Pain Descriptors / Indicators: Aching Pain Intervention(s): Limited activity within patient's tolerance;Monitored during session;Other (comment)(RN is aware)     Hand Dominance Left   Extremity/Trunk Assessment Upper Extremity Assessment Upper Extremity Assessment: Generalized weakness   Lower Extremity Assessment Lower Extremity Assessment: Defer to PT evaluation   Cervical / Trunk Assessment Cervical / Trunk Assessment: Kyphotic   Communication Communication Communication: No difficulties   Cognition Arousal/Alertness: Awake/alert Behavior During Therapy: [facility]  for  tasks assessed/performed Overall Cognitive Status: Within Functional Limits for tasks assessed               Home Living Family/patient expects to be discharged to:: Private residence Living Arrangements: Spouse/significant other Available Help at Discharge: Family Type of Home: House Home Access: Stairs to enter Technical brewer of Steps: 2   Home Layout: One level     Bathroom Shower/Tub: Occupational psychologist: Standard Bathroom Accessibility: Yes   Home Equipment: Other (comment)   Additional Comments: oxygen concentrator      Prior Functioning/Environment Level of Independence: Independent             OT Problem List: Decreased strength;Decreased activity tolerance;Decreased safety awareness;Impaired balance (sitting and/or standing);Decreased knowledge of precautions;Cardiopulmonary status limiting activity      OT Treatment/Interventions: Self-care/ADL training;Therapeutic exercise;Therapeutic activities;Energy conservation;DME and/or AE instruction;Patient/family education;Manual therapy;Balance training    OT Goals(Current goals can be found in the care plan section) Acute Rehab OT Goals Patient Stated Goal: to go home OT Goal Formulation: With patient Time For Goal Achievement: 05/13/19 Potential to Achieve Goals: Good ADL Goals Pt Will Perform Upper Body Bathing: with modified independence Pt Will Perform Lower Body Bathing: with modified independence Pt Will Perform Upper Body Dressing: with modified independence Pt Will Perform Lower Body Dressing: with modified independence Pt Will Transfer to Toilet: with modified independence Pt Will Perform Toileting - Clothing Manipulation and hygiene: with modified independence Pt Will Perform Tub/Shower Transfer: with modified independence;shower seat  OT Frequency: Min 2X/week   Barriers to D/C: Other (comment)  none known at this time          AM-PAC OT "6 Clicks" Daily Activity      Outcome Measure Help from another person eating meals?: None Help from another person taking care of personal grooming?: A Little Help from another person toileting, which includes using toliet, bedpan, or urinal?: A Little Help from another person bathing (including washing, rinsing, drying)?: A Little Help from another person to put on and taking off regular upper body clothing?: A Little Help from another person to put on and taking off regular lower body clothing?: A Little 6 Click Score: 19   End of Session Equipment Utilized During Treatment: Oxygen Nurse Communication: Precautions;Mobility status  Activity Tolerance: Patient limited by fatigue Patient left: in chair;with call bell/phone within reach;with bed alarm set  OT Visit Diagnosis: Unsteadiness on feet (R26.81);Muscle weakness (generalized) (M62.81)                Time: 3267-1245 OT Time Calculation (min): 16 min Charges:  OT General Charges $OT Visit: 1 Visit OT Evaluation $OT Eval Low Complexity: 1 Low  Dewayne Severe P MS, OTR/L 04/29/2019, 2:19 PM

## 2019-04-29 NOTE — Consult Note (Signed)
   [facility]   04/29/2019  MAYO FAULK Jan 10, 1946 [id number]    Patient is currently active with [facility] for chronic disease management services. Patient has been recently engaged by [facility] management Coordinator for COPD management.  Our community based plan of care has focused on disease management and community resource support.    Patient was reviewed for 21% risk score for unplanned readmission and hospitalization under his Medicare/ [employer] plan.   Patient's primary care provider [name] with [facility] [place]) will be providing transition of care post discharge. Patient will be receiving follow-up call from [facility] care management coordinator for complex disease management.   Chart review and MD brief narrative on 04/28/19 reveal as: Charles Camel Cookis a 73 y.o.malewith medical history significant forchronic combined systolic and diastolic CHF, permanent atrial fibrillation s/p AV nodal ablation and AICD, s/p mitral valve replacement with bioprosthetic valve, COPD on 2 L supplemental O2 via [place], history of CVA, history of CNS bleed, hypertension, hyperlipidemia, type 2 diabetes, history of lung cancer treated with wedge resection,    who presented to the ED for evaluation ofdyspnea. in ED O2 saturation dropped to 84% on 3 L O2 while standing. - chest x-ray showed pulmonary edema bilaterally   (Acute on chronic respiratory failure with hypoxia- Acute on chronic combined systolic diastolic CHF exacerbation; COPD, A-Fib)  Transition of care SW note reveals that patient is declining any home health services at this time and wishes to go home with wife.  Plan: Will follow for progress and disposition. Will notify [facility] RN CM of disposition/ needs for post hospital follow-up.  Inpatient TOC team member to be made aware that [facility] is following.     Of note, [facility] Care Management services does not replace or interfere with any  services that are needed or arranged by inpatient case management or social work.      For questions and additional information, please contact:  Christobal Morado A. Ronasia Isola, BSN, RN-BC [facility] Liaison Cell: [phone]

## 2019-04-29 NOTE — Consult Note (Addendum)
Cardiology Consultation:   Patient ID: [name]; [id number]; 16-Jan-1946   Admit date: 04/27/2019 Date of Consult: 04/29/2019  Primary Care Provider: [name], NP Primary Cardiologist: Candee Furbish, MD Primary Electrophysiologist:  Cristopher Peru, MD  Patient Profile:   [name] is a 73 y.o. male with a PMH of chronic combined CHF, non-ischemic cardiomyopathy s/p Bi-V-ICD, permanent atrial fibrillation s/p AV nodal ablation, mitral valve endocarditis c/b severe MR s/p minimally invasive bioprosthetic MVR 2018, sinus of valsalva aneurysm, HTN, HLD, DM type 2, COPD on home O2, hx of intracranial hemorrhage, and lung cancer s/p resection, who is being seen today for the evaluation of atrial flutter, chest pain, and CHF at the request of Dr. Benny Lennert.  History of Present Illness:   [name] was in his usual state of health until 04/27/2019 when he began experiencing worsening SOB. He also reported intermittent lightheadedness and left sided chest pain which started 04/28/2019. He increased his home O2 from 2 to 3L without improvement in symptoms, prompting him to present to the ED for further evaluation.   He was last evaluated by cardiology at an outpatient visit with Dr. Marlou Porch 03/12/2019, at which time he was felt to be stable with no change in chronic SOB and no new cardiac complaints. Prior to this admission, his last echocardiogram 07/2018 showed EF 35-40%, mild AI, stable MVR, severely dilated LA/RA, moderate pulm HTN, and stable aortic root aneurysm. His last ischemic evaluation appears to be a LHC in 2009 which revealed 30% distal LAD stenosis, otherwise normal coronary arteries. He has had several surveillance coronary CTA studies which mention significant coronary artery calcifications of the 3 major vessels, though does not classify them further. He has a history of atrial fibrillation s/p ablation, though is not on anticoagulation given history of intracranial hemorrhage. He has  a history of sinus of valsalva aneurysm, followed by Dr. Roxy Manns with annual cardiac CTA, stable on last check 12/2018 .  He denies any significant improvement in his symptoms since presentation. Still feeling SOB at rest which is worse with activity. He also reports left lateral lower chest wall pain which he describes as sharp in nature but he has a hard time characterizing this further. No clear exertional component. Denies pleuritic nature, changes in activity, or recent pulled muscle. He reports pain has been constant over the past 24 hours at a 4/10, with occasional spikes in pain to 7/10 which occur without rhyme or reason. He received SL nitro this morning for an increase in pain which improved pain back to a 4/10. Also some improvement with tylenol. He denies changes in chronic orthopnea, PND, LE edema, weight gain, dizziness, syncope, or palpitations.   Hospital course: BP soft, otherwise VSS; noted to be hypoxic to 84% on admission which has improved. Labs notable for electrolytes wnl, Cr 1.14, Hgb 9.4, PLT wnl, B12 low, iron studies c/w iron deficiency, BNP 630, Trop 19>17>15. Recent outpatient labs notable for A1C 7.5 and LDL 20.   Echo with EF 25-30% (previously 35-40% 07/2018), mild LVH, severe biatrial enlargement, s/p MVR, and moderate pulm HTN. EKG with V-paced rhythm with underlying atrial flutter, rate 75 bpm. Patient was admitted to medicine for acute on chronic respiratory failure 2/2 acute on chronic combined CHF. Patient reported chest pain this morning and was given SL nitro with some improvement in symptoms but not resolution. He has been diuresing with IV lasix 40mg  BID with UOP net -40mL in the past 24 hours and -1.9L this  admission. Weight is down from 160lbs to 158lbs today. Cardiology asked to evaluate for chest pain, CHF, and atrial flutter.   Past Medical History:  Diagnosis Date   Atrial fibrillation ([place])    AV Node ablation January, 2010, for rapid atrial fib   Atrial  septal defect    Closed with surgery January, 2010   Automatic implantable cardioverter-defibrillator in situ    LV dysfunction and pacer needed for AV node lesion   Cardiomyopathy    non-ischemic   CHF (congestive heart failure) (HCC)    Chronic combined systolic and diastolic CHF (congestive heart failure) (HCC)    Colon polyps    COPD (chronic obstructive pulmonary disease) (HCC)    O2- 2 liters, nasal cannula, q night    COPD GOLD II 01/11/2007   PFT's 09/25/13  FEV1  2.18 (61%) ratio 64 no change p B2 and DLC0  32% corrects to 68% - trial off advair and acei rec starting  08/23/2013      CVA (cerebral vascular accident) [facility]) 2009   denies residual on 08/14/2013   Diabetes mellitus without complication ([place])    type 2   Dyslipidemia    Dysrhythmia    Ejection fraction < 50%    Endocarditis    Bacterial, 2009   Headache(784.0)    related to stroke only   HLD (hyperlipidemia)    Hypertension    Intracranial hemorrhage (HCC)    Coumadin cannot be used because of the history of his bleed   Lung cancer ([place]) 11/29/2013   T1N0 Stage Ia non-small cell carcinoma left lung treated with wedge resection   Mitral regurgitation    Severe symptomatic primary MR due to bacterial endocarditis, treated w/ MVR // Echo 1/18 EF 40-45, diffuse HK, dilated aorta at 42 mm/aortic root 47 mm; linear echogenic structure in ascending aorta - suspect reverberation artifact / consider CT or TEE to rule out dissection flap, bioprosthetic MVR with mean gradient 3, severe LAE, low normal RVSF, severe RAE, moderate TR, PASP 42   Myocardial infarction [facility]) 2010   Pacemaker    combo pacer and icd   Permanent atrial fibrillation    Originally Coumadin use for atrial fibrillation  //   he had intracerebral hemorrhage with an INR of 2.3 June, 2009. Anticoagulation could no longer be used.  //  Rapid atrial fibrillation after inferior MI October, 2010..........Marland Kitchen AV node ablation done at that time  with ICD pacemaker placed (EF 35%).   //   Left atrial appendage tied off at the time of mitral valve surgery January, 2010 (maze pro   Pneumonia 07/2018   Prosthetic valve dysfunction    Mild mitral stenosis   Pulmonary hypertension (HCC)    Moderate   Renal artery stenosis (HCC)    Mild by history   Sinus of Valsalva aneurysm 08/26/2016   Spontaneous pneumothorax    right thoracotomy - distant past   Status post minimally invasive mitral valve replacement with bioprosthetic valve    33 mm Medtronic Mosaic porcine bioprosthesis placed via right mini thoracotomy for bacterial endocarditis complicated by severe MR and CHF    Thoracic aortic aneurysm ([place]) 08/11/2016   a - Chest CTA 1/18:  Aneurysmal dilatation of aortic root is noted at 5.1 cm.     Past Surgical History:  Procedure Laterality Date   APPENDECTOMY     ASD REPAIR, SECUNDUM  07/17/2008   pericardial patch closure of ASD   AV NODE ABLATION  07/2008  for rapid atrial fib   CARDIAC CATHETERIZATION     CARDIAC DEFIBRILLATOR PLACEMENT  ~ 2010   [employer]   CARDIAC VALVE REPLACEMENT     CATARACT EXTRACTION W/ INTRAOCULAR LENS  IMPLANT, BILATERAL Bilateral    ESOPHAGOGASTRODUODENOSCOPY (EGD) WITH PROPOFOL N/A 09/29/2016   Procedure: ESOPHAGOGASTRODUODENOSCOPY (EGD) WITH PROPOFOL;  Surgeon: [name], MD;  Location: [facility] ENDOSCOPY;  Service: Endoscopy;  Laterality: N/A;   HERNIA REPAIR     IMPLANTABLE CARDIOVERTER DEFIBRILLATOR (ICD) GENERATOR CHANGE N/A 02/06/2012   Procedure: ICD GENERATOR CHANGE;  Surgeon: [name], MD;  Location: [facility] CATH LAB;  Service: Cardiovascular;  Laterality: N/A;   INSERT / REPLACE / REMOVE PACEMAKER     MASS BIOPSY Left    neck mass   MITRAL VALVE REPLACEMENT Right 07/17/2008   82mm Medtronic Mosaic porcine bioprosthesis   PENILE PROSTHESIS IMPLANT     RIGHT HEART CATHETERIZATION N/A 08/16/2013   Procedure: RIGHT HEART CATH;  Surgeon: Josue Hector, MD;  Location: [facility] CATH  LAB;  Service: Cardiovascular;  Laterality: N/A;   TEE WITHOUT CARDIOVERSION N/A 09/06/2016   Procedure: TRANSESOPHAGEAL ECHOCARDIOGRAM (TEE);  Surgeon: Dorothy Spark, MD;  Location: [facility] ENDOSCOPY;  Service: Cardiovascular;  Laterality: N/A;   THORACOTOMY Right 1970's   spontaneous pneumothorax - while in the [place]     left foot hammer toe   TONSILLECTOMY     VIDEO ASSISTED THORACOSCOPY (VATS)/WEDGE RESECTION Left 11/29/2013   Procedure: Video assisted thoracoscopy for wedge resection; mini thoracotomy;  Surgeon: [name], MD;  Location: [place];  Service: Thoracic;  Laterality: Left;     Home Medications:  Prior to Admission medications   Medication Sig Start Date End Date Taking? Authorizing Provider  acetaminophen (TYLENOL) 325 MG tablet Take 325-650 mg by mouth every 8 (eight) hours as needed for headache.   Yes [provider]  albuterol (PROVENTIL HFA) 108 (90 Base) MCG/ACT inhaler Inhale 2 puffs into the lungs every 6 (six) hours as needed for wheezing or shortness of breath.   Yes [provider]  amoxicillin (AMOXIL) 500 MG capsule Take 2,000 mg by mouth See admin instructions. Take 2,000 mg by mouth one hour prior to dental procedures   Yes [provider]  aspirin EC 81 MG tablet Take 1 tablet (81 mg total) by mouth daily. 12/07/12  Yes Carlena Bjornstad, MD  atorvastatin (LIPITOR) 10 MG tablet TAKE 1 TABLET BY MOUTH EVERY DAY Patient taking differently: Take 10 mg by mouth daily.  04/09/19  Yes Nafziger, Tommi Rumps, NP  budesonide-formoterol (SYMBICORT) 160-4.5 MCG/ACT inhaler INHALE 2 PUFFS INTO THE LUNGS TWICE A DAY Patient taking differently: Inhale 2 puffs into the lungs 2 (two) times daily.  10/22/18  Yes Lauraine Rinne, NP  carvedilol (COREG) 3.125 MG tablet TAKE 1 TABLET BY MOUTH TWICE A DAY WITH MEALS Patient taking differently: Take 3.125 mg by mouth 2 (two) times daily with a meal.  03/19/19  Yes Nafziger, Tommi Rumps, NP  furosemide  (LASIX) 40 MG tablet TAKE 1 TABLET BY MOUTH EVERY DAY Patient taking differently: Take 40 mg by mouth daily.  04/09/19  Yes Nafziger, Tommi Rumps, NP  metFORMIN (GLUCOPHAGE) 1000 MG tablet TAKE 1 TABLET BY MOUTH TWICE A DAY WITH MEALS Patient taking differently: Take 1,000 mg by mouth 2 (two) times daily with a meal.  03/29/19  Yes Nafziger, Tommi Rumps, NP  nitroGLYCERIN (NITROSTAT) 0.4 MG SL tablet Place 0.4 mg under the tongue every 5 (five) minutes  x 3 doses as needed for chest pain.    Yes [provider]  OXYGEN Inhale 2-3 L/min into the lungs continuous.    Yes [provider]  valsartan (DIOVAN) 40 MG tablet TAKE 1 TABLET BY MOUTH EVERY DAY Patient taking differently: Take 40 mg by mouth daily.  03/29/19  Yes Nafziger, Tommi Rumps, NP    Inpatient Medications: Scheduled Meds:  aspirin EC  81 mg Oral Daily   atorvastatin  10 mg Oral Daily   carvedilol  3.125 mg Oral BID WC   enoxaparin (LOVENOX) injection  40 mg Subcutaneous Q24H   furosemide  40 mg Intravenous Q12H   insulin aspart  0-9 Units Subcutaneous TID WC   insulin glargine  12 Units Subcutaneous Daily   mouth rinse  15 mL Mouth Rinse BID   mometasone-formoterol  2 puff Inhalation BID   potassium chloride  20 mEq Oral Daily   sodium chloride flush  3 mL Intravenous Q12H   Continuous Infusions:  sodium chloride Stopped (04/28/19 1048)   PRN Meds: sodium chloride, acetaminophen, albuterol, nitroGLYCERIN, ondansetron (ZOFRAN) IV, sodium chloride flush  Allergies:    Allergies  Allergen Reactions   Anticoagulant Compound Other (See Comments)    Pt had intracranial bleed, therefore all anticoagulation is contraindicated per Dr. Ron Parker   Other Other (See Comments)    Per Dr. Halford Chessman (Surgeon): stated that the patient cannot be put under for any surgery, as he has an enlarged aorta. He would stand only a 50/50 chance of surviving. He has lung issues, diminished lung tissue, COPD, and emphysema.   Warfarin  Sodium Other (See Comments)    Pt had intracranial bleed, therefore all anticoagulation is contraindicated per Dr. Ron Parker    Social History:   Social History   Socioeconomic History   Marital status: Married    Spouse name: [name]   Number of children: 0   Years of education: [employer] education level: Not on file  Occupational History   Occupation: Retired    Fish farm manager: RETIRED    Comment: Acupuncturist strain: Not on file   Food insecurity    Worry: Not on file    Inability: Not on Lexicographer needs    Medical: Not on file    Non-medical: Not on file  Tobacco Use   Smoking status: Former Smoker    Packs/day: 1.00    Years: 45.00    Pack years: 45.00    Types: Cigarettes    Quit date: 08/11/2013    Years since quitting: 5.7   Smokeless tobacco: Never Used   Tobacco comment: 08/14/2013 "quit smoking in 2009"  Substance and Sexual Activity   Alcohol use: No    Alcohol/week: 0.0 standard drinks    Comment: 08/14/2013 "used to drink beer; quit:in 1982"   Drug use: No   Sexual activity: Yes  Lifestyle   Physical activity    Days per week: Not on file    Minutes per session: Not on file   Stress: Not on file  Relationships   Social connections    Talks on phone: Not on file    Gets together: Not on file    Attends religious service: Not on file    Active member of club or organization: Not on file    Attends meetings of clubs or organizations: Not on file    Relationship status: Not on file   Intimate partner violence  Fear of current or ex partner: Not on file    Emotionally abused: Not on file    Physically abused: Not on file    Forced sexual activity: Not on file  Other Topics Concern   Not on file  Social History Narrative   Patient lives at home with his spouse.   Caffeine Use: none   Worked for the [employer]   Has two boys and a girl. All live local.     Family History:    Family  History  Problem Relation Age of Onset   Stomach cancer Father    Stroke Mother      ROS:  Please see the history of present illness.   All other ROS reviewed and negative.     Physical Exam/Data:   Vitals:   04/29/19 0801 04/29/19 0906 04/29/19 1048 04/29/19 1056  BP: 112/66 104/64 (!) 80/55 92/63  Pulse: 73 74 72 74  Resp: 15 20    Temp: 98.2 F (36.8 C) 98.4 F (36.9 C)    TempSrc: Oral Oral    SpO2: 92% 95% 98% 98%  Weight:      Height:        Intake/Output Summary (Last 24 hours) at 04/29/2019 1128 Last data filed at 04/29/2019 1027 Gross per 24 hour  Intake 1209.5 ml  Output 600 ml  Net 609.5 ml   Filed Weights   04/27/19 2030 04/28/19 0538 04/29/19 0531  Weight: 72.6 kg 71.9 kg 71.8 kg   Body mass index is 22.08 kg/m.  General:  Chronically ill appearing gentleman laying in bed in no acute distress HEENT: sclera anicteric  Neck: no JVD Vascular: No carotid bruits; distal pulses 2+ bilaterally Cardiac:  normal S1, S2; RRR; no murmus, rubs, or gallops; + TTP of left lower lateral chest wall Lungs:  clear to auscultation bilaterally, no wheezing, rhonchi or rales; conversationally dyspneic  Abd: NABS, soft, nontender, no hepatomegaly Ext: no edema; varicose veins present Musculoskeletal:  No deformities, BUE and BLE strength normal and equal Skin: warm and dry  Neuro:  CNs 2-12 intact, no focal abnormalities noted Psych:  Normal affect   EKG:  The EKG was personally reviewed and demonstrates:  V-paced rhythm with underlying atrial flutter, rate 75 bpm. Telemetry:  Telemetry was personally reviewed and demonstrates:  V-paced rhythm with underlying atrial flutter; rate in the 70s  Relevant CV Studies: Echocardiogram 04/28/2019: 1. Left ventricular ejection fraction, by visual estimation, is 25 to 30%. The left ventricle has moderate to severely decreased function. Mildly increased left ventricular size. There is mildly increased left ventricular  hypertrophy.  2. Multiple segmental abnormalities exist. See findings.  3. Left ventricular diastolic Doppler parameters are indeterminate pattern of LV diastolic filling.  4. Global right ventricle has moderately reduced systolic function.The right ventricular size is normal. No increase in right ventricular wall thickness.  5. Left atrial size was severely dilated.  6. Right atrial size was severely dilated.  7. Mild aortic valve annular calcification.  8. The mitral valve has been repaired/replaced. There is a 33 mm Medtronic Mosiac prosthesis in position. Trace mitral valve regurgitation. Mean gradient of 5 mmHg suggest mildly stenotic flow.  9. The tricuspid valve is grossly normal. Tricuspid valve regurgitation is mild. 10. The aortic valve is tricuspid Aortic valve regurgitation is trivial by color flow Doppler. 11. The pulmonic valve was grossly normal. Pulmonic valve regurgitation is not visualized by color flow Doppler. 12. The aortic root was not well visualized. 13. Proximal  aorta and arch are not well defined. The aortic root appears dilated. CTA would define course better if clinically indicated. 14. Moderately elevated pulmonary artery systolic pressure. 15. A device wire is visualized. 16. The inferior vena cava is dilated in size with <50% respiratory variability, suggesting right atrial pressure of 15 mmHg. 17. The tricuspid regurgitant velocity is 3.19 m/s, and with an assumed right atrial pressure of 15 mmHg, the estimated right ventricular systolic pressure is moderately elevated at 55.7 mmHg.  Laboratory Data:  Chemistry Recent Labs  Lab 04/27/19 1502 04/28/19 0617 04/29/19 0524  NA 139 139 139  K 4.4 3.8 4.4  CL 104 102 102  CO2 23 25 29   GLUCOSE 139* 208* 113*  BUN 25* 28* 27*  CREATININE 1.18 1.08 1.14  CALCIUM 8.9 8.8* 8.6*  GFRNONAA >60 >60 >60  GFRAA >60 >60 >60  ANIONGAP 12 12 8     No results for input(s): PROT, ALBUMIN, AST, ALT, ALKPHOS, BILITOT in  the last 168 hours. Hematology Recent Labs  Lab 04/27/19 1502 04/27/19 2034 04/28/19 0617 04/29/19 0524  WBC 8.4  --  6.8 7.5  RBC 3.98* 4.43 4.02* 4.25  HGB 9.3*  --  8.9* 9.4*  HCT 31.6*  --  31.4* 33.3*  MCV 79.4*  --  78.1* 78.4*  MCH 23.4*  --  22.1* 22.1*  MCHC 29.4*  --  28.3* 28.2*  RDW 18.3*  --  18.2* 18.3*  PLT 233  --  229 219   Cardiac EnzymesNo results for input(s): TROPONINI in the last 168 hours. No results for input(s): TROPIPOC in the last 168 hours.  BNP Recent Labs  Lab 04/27/19 1502  BNP 630.0*    DDimer No results for input(s): DDIMER in the last 168 hours.  Radiology/Studies:  Dg Chest Portable 1 View  Result Date: 04/27/2019 CLINICAL DATA:  73 year old male with history of chest pain. Dyspnea. EXAM: PORTABLE CHEST 1 VIEW COMPARISON:  Chest x-ray 08/17/2018. FINDINGS: Chronic elevation of the left hemidiaphragm. There is cephalization of the pulmonary vasculature, indistinctness of the interstitial markings, and patchy airspace disease throughout the lungs bilaterally suggestive of moderate pulmonary edema. No definite pleural effusions. Mild cardiomegaly. Upper mediastinal contours are within normal limits. Right-sided biventricular pacemaker/AICD with lead tips projecting over the right ventricle and lateral wall the left ventricle via the coronary sinus and coronary veins. IMPRESSION: 1. The appearance the chest suggests congestive heart failure, as above. Electronically Signed   By: Vinnie Langton M.D.   On: 04/27/2019 15:27    Assessment and Plan:   1. Chest pain in patient with non-obstructive CAD: atypical sharp chest pain. TTP of left chest wall. Trops 19>17>15. Trend not consistent with ACS. Cardiac CTA 12/2018 reviewed with Dr. Meda Coffee and patient felt to not have significant obstructive coronary artery disease.  - No further ischemic evaluation at this time.  - Continue risk factor modifications below   2. Acute on chronic combined CHF:  patient presented with SOB. Found to have EF 25-30% (previously 35-40% 07/2018).  He was started on IV lasix 40mg  BID with UOP net -47mL in the past 24 hours with -1.9L this admission. Weight is down from 160lbs to 158lbs today. Also with complaints of chest pain and found to be in atrial flutter on EKG. Possible decline in EF is 2/2 underlying arrhythmia. Soft blood pressures limit titration of CHF medications. - Will continue IV lasix 40mg  BID - Could consider transition from valsartan to entresto if BP will allow - Continue  carvedilol - Continue to monitor strict I&Os and daily weights.  - Will have PPM interrogated to rule out other underlying arrhythmias that may have contributed to decline in EF.  3. Permanent atrial flutter with CVR: noted on EKG this admission, consistent with prior EKGs for the past several years. Rate well controlled in the 70s. Not on anticoagulation due to history of intracranial hemorrhage on coumadin in the past.  - Continue carvedilol for rate control  4. HTN: BP soft. Home valsartan on hold.  - Continue carvedilol  5. Non-ischemic cardiomyopathy s/p BiV-ICD: Biventricular pacing on EKG this admission. No complaints of ICD shock.  - Continue diuresis as above - Will have PPM interrogated  6. Sinus valsalva aneurysm: followed outpatient by Dr. Roxy Manns. Stable on cardiac CTA 12/2018 - Continue routine outpatient monitoring - Continue aggressive BP control   7. HLD: LDL 30 on recent lipid panel - Continue atorvastatin  8. Mitral valve endocarditis c/b severe MR s/p MVR in 2018: valve appeared stable on echo  - Continue routine monitoring.   9. DM type 2: Hgb A1C 7.5 04/12/2019; goal <7 - Continue management per primary team.  Given multiple comorbidities and continued decline in cardiac function would not be unreasonable to obtain a palliative care consult.    For questions or updates, please contact [place] Please consult [email or web address] for contact info  under Cardiology/STEMI.   Signed, Abigail Butts, PA-C  04/29/2019 11:28 AM [phone]  The patient was seen, examined and discussed with Abigail Butts, PA-C  and I agree with the above.   73 y.o. male with a PMH of chronic combined CHF, non-ischemic cardiomyopathy s/p Bi-V-ICD, permanent atrial fibrillation s/p AV nodal ablation, mitral valve endocarditis c/b severe MR s/p minimally invasive bioprosthetic MVR 2018, sinus of valsalva aneurysm, HTN, HLD, DM type 2, COPD on home O2, hx of intracranial hemorrhage, and lung cancer s/p resection, who is being seen today for the evaluation of atrial flutter, chest pain, and CHF. Patient states that he has been experiencing worsening shortness of breath for about a week, now with left-sided chest pain that he cannot describe in more details.  And episodes of lightheadedness but no falls.  He is on chronic home oxygen.  He is compliant with his medications. He was last evaluated by cardiology at an outpatient visit with Dr. Marlou Porch 03/12/2019, at which time he was felt to be stable with no change in chronic SOB and no new cardiac complaints. Prior to this admission, his last echocardiogram 07/2018 showed EF 35-40%, mild AI, stable MVR, severely dilated LA/RA, moderate pulm HTN, and stable aortic root aneurysm.  During this visit his LVEF decreased now 25 to 30%, I have personally reviewed his last echo and his LVEF is not significantly changed from prior.  However his RVEF now seems to be decreased from prior. He is telemetry shows atrial flutter with ventricular rates in 70s.  On physical exam this is a cachectic gentleman that appears mildly confused, his lungs are clear, he has 3 out of 6 systolic murmur.  His lower extremities do not have significant edema.  His BiV ICD was interrogated today and appears to function normally, there is no evidence for ventricular tachycardia.  Because he does not have an atrial lead they cannot see in what rhythm he  is however EKG clearly consistent with atrial flutter that he has been in for years.  His ICD interrogation showed also shows increased impedance consistent with fluid overload. I have reviewed  his last CTA performed for his regular evaluation of sinus of Valsalva aneurysm and his coronaries have normal origin with right dominance and there is mild at most moderate CAD in RCA and LAD but by no means there is obstructive CAD.  This was in June 2020  Assessment and plan:  This is a very complicated gentleman with complicated cardiac history as depicted above with multiorgan failure currently with acute on chronic combined systolic diastolic CHF, pulmonary hypertension, now with worsening RV failure, worsening chronic respiratory failure secondary to COPD on home O2, in chronic atrial flutter, with chronic malnutrition and rapid weight loss.  Patient does not seem to be in significant fluid overload on physical exam however increased impedance, I will continue IV Lasix, and follow electrolytes creatinine closely, short-term he might get some benefit from diuresis however overall his prognosis is very poor as this is end-stage CHF.  Atrial flutter is probably contributing to this however he is post AV nodal ablation and he cannot be anticoagulated given the fact that he had a prior cerebral hemorrhage.  We will consult palliative care to establish goals or care.  Ena Dawley, MD 04/29/2019

## 2019-04-29 NOTE — Progress Notes (Signed)
   Vital Signs MEWS/VS Documentation      04/29/2019 0801 04/29/2019 0906 04/29/2019 1048 04/29/2019 1056   MEWS Score:  0  0  2  1   MEWS Score Color:  Green  Green  Yellow  Green   Resp:  15  20  -  -   Pulse:  73  74  72  74   BP:  112/66  104/64  (!) 80/55  92/63   Temp:  98.2 F (36.8 C)  98.4 F (36.9 C)  -  -   O2 Device:  Nasal Cannula  Nasal Cannula  -  -   O2 Flow Rate (L/min):  3 L/min  3 L/min  -  -       Read RN progress notes.     Lurlene Ronda G Saryah Loper 04/29/2019,11:07 AM

## 2019-04-29 NOTE — Progress Notes (Signed)
Pts daughter at bedside  

## 2019-04-29 NOTE — Evaluation (Signed)
Physical Therapy Evaluation Patient Details Name: [name] MRN: [id number] DOB: 1945-09-22 Today's Date: 04/29/2019   History of Present Illness  73 y.o. male with medical history significant for chronic combined systolic and diastolic CHF, permanent atrial fibrillation s/p AV nodal ablation and AICD, s/p mitral valve replacement with bioprosthetic valve, COPD on 2 L supplemental O2 via [place] , history of CVA, history of CNS bleed, hypertension, hyperlipidemia, type 2 diabetes, history of lung cancer treated with wedge resection who presented to the ED for evaluation of dyspnea, x-ray showing bilateral pulmonary edema.  Clinical Impression  Pt demonstrates deficits in gait, balance, and endurance, but reports he is close to his baseline. Pt is able to transfer and ambulate household distances without physical assistance and no assistive devices. Pt desaturates to 85% at end of ambulation, but reports no SOB, recovering to 90% within 2 minutes of sitting. PT educated pt on benefits of outpatient pulmonary rehab however pt seems to not be interested in these services at discharge. Pt will continue to benefit from PT POC to restored independence in mobility.    Follow Up Recommendations Other (comment)(pulmonary rehab)    Equipment Recommendations  None recommended by PT    Recommendations for Other Services       Precautions / Restrictions Precautions Precautions: Fall Precaution Comments: Watch O2 and BP      Mobility  Bed Mobility Overal bed mobility: (pt sitting edge of bed upon arrival) Bed Mobility: Rolling;Sit to Supine;Supine to Sit Rolling: Supervision   Supine to sit: Supervision Sit to supine: Supervision   General bed mobility comments: min cuing for technique and hand placement  Transfers Overall transfer level: Needs assistance Equipment used: None Transfers: Sit to/from Stand Sit to Stand: Supervision Stand pivot transfers: Min assist;Min guard        General transfer comment: min A with mobility with ambulating 10' in room to recliner chair  Ambulation/Gait Ambulation/Gait assistance: Supervision Gait Distance (Feet): 10 Feet Assistive device: None Gait Pattern/deviations: Step-through pattern;Drifts right/left Gait velocity: reduced Gait velocity interpretation: 1.31 - 2.62 ft/sec, indicative of limited community ambulator General Gait Details: pt with slowed step through gait, increased drift to left and right, otherwise unremarkable  Stairs            Wheelchair Mobility    Modified Rankin (Stroke Patients Only)       Balance Overall balance assessment: Needs assistance Sitting-balance support: No upper extremity supported;Feet supported Sitting balance-Leahy Scale: Good     Standing balance support: No upper extremity supported Standing balance-Leahy Scale: Fair Standing balance comment: supervision                             Pertinent Vitals/Pain Pain Assessment: No/denies pain Faces Pain Scale: Hurts a little bit Pain Location: chest Pain Descriptors / Indicators: Aching Pain Intervention(s): Limited activity within patient's tolerance;Monitored during session;Other (comment)(RN is aware)    Home Living Family/patient expects to be discharged to:: Private residence Living Arrangements: Spouse/significant other Available Help at Discharge: Family Type of Home: House Home Access: Stairs to enter Entrance Stairs-Rails: Can reach both Entrance Stairs-Number of Steps: 2 Home Layout: One level Home Equipment: None Additional Comments: oxygen concentrator    Prior Function Level of Independence: Independent         Comments: pt ambulates around household and backyard     Hand Dominance   Dominant Hand: Left    Extremity/Trunk Assessment   Upper Extremity Assessment Upper  Extremity Assessment: Defer to OT evaluation    Lower Extremity Assessment Lower Extremity Assessment:  Overall WFL for tasks assessed    Cervical / Trunk Assessment Cervical / Trunk Assessment: Kyphotic  Communication   Communication: HOH  Cognition Arousal/Alertness: Awake/alert Behavior During Therapy: WFL for tasks assessed/performed Overall Cognitive Status: Within Functional Limits for tasks assessed                                        General Comments      Exercises     Assessment/Plan    PT Assessment Patient needs continued PT services  PT Problem List Decreased activity tolerance;Decreased balance;Decreased mobility       PT Treatment Interventions Gait training;Stair training;Functional mobility training;Patient/family education;Therapeutic exercise;Balance training;Neuromuscular re-education    PT Goals (Current goals can be found in the Care Plan section)  Acute Rehab PT Goals Patient Stated Goal: to go home PT Goal Formulation: With patient Time For Goal Achievement: 05/13/19 Potential to Achieve Goals: Good    Frequency Min 2X/week   Barriers to discharge        Co-evaluation               AM-PAC PT "6 Clicks" Mobility  Outcome Measure Help needed turning from your back to your side while in a flat bed without using bedrails?: None Help needed moving from lying on your back to sitting on the side of a flat bed without using bedrails?: None Help needed moving to and from a bed to a chair (including a wheelchair)?: None Help needed standing up from a chair using your arms (e.g., wheelchair or bedside chair)?: None Help needed to walk in hospital room?: None Help needed climbing 3-5 steps with a railing? : None 6 Click Score: 24    End of Session Equipment Utilized During Treatment: Gait belt Activity Tolerance: Patient tolerated treatment well Patient left: in bed;with call bell/phone within reach Nurse Communication: Mobility status PT Visit Diagnosis: Unsteadiness on feet (R26.81)    Time: 3151-7616 PT Time  Calculation (min) (ACUTE ONLY): 17 min   Charges:   PT Evaluation $PT Eval Low Complexity: [place], PT, DPT Acute Rehabilitation Pager: [phone]   Zenaida Niece 04/29/2019, 4:13 PM

## 2019-04-30 DIAGNOSIS — J9621 Acute and chronic respiratory failure with hypoxia: Secondary | ICD-10-CM | POA: Diagnosis not present

## 2019-04-30 DIAGNOSIS — J449 Chronic obstructive pulmonary disease, unspecified: Secondary | ICD-10-CM | POA: Diagnosis not present

## 2019-04-30 DIAGNOSIS — I5043 Acute on chronic combined systolic (congestive) and diastolic (congestive) heart failure: Secondary | ICD-10-CM | POA: Diagnosis not present

## 2019-04-30 DIAGNOSIS — Z9889 Other specified postprocedural states: Secondary | ICD-10-CM | POA: Diagnosis not present

## 2019-04-30 LAB — GLUCOSE, CAPILLARY
Glucose-Capillary: 117 mg/dL — ABNORMAL HIGH (ref 70–99)
Glucose-Capillary: 178 mg/dL — ABNORMAL HIGH (ref 70–99)
Glucose-Capillary: 139 mg/dL — ABNORMAL HIGH (ref 70–99)
Glucose-Capillary: 133 mg/dL — ABNORMAL HIGH (ref 70–99)

## 2019-04-30 LAB — BASIC METABOLIC PANEL
Anion gap: 9 (ref 5–15)
BUN: 24 mg/dL — ABNORMAL HIGH (ref 8–23)
CO2: 27 mmol/L (ref 22–32)
Calcium: 8.6 mg/dL — ABNORMAL LOW (ref 8.9–10.3)
Chloride: 102 mmol/L (ref 98–111)
Creatinine, Ser: 1.05 mg/dL (ref 0.61–1.24)
GFR calc Af Amer: >60 mL/min (ref >60)
GFR calc non Af Amer: >60 mL/min (ref >60)
Glucose, Bld: 112 mg/dL — ABNORMAL HIGH (ref 70–99)
Potassium: 3.6 mmol/L (ref 3.5–5.1)
Sodium: 138 mmol/L (ref 135–145)

## 2019-04-30 NOTE — Telephone Encounter (Signed)
ATC pt, line rang busy x1.

## 2019-04-30 NOTE — Progress Notes (Signed)
Physical Therapy Treatment Patient Details Name: [name] MRN: [id number] DOB: 1946/05/14 Today's Date: 04/30/2019    History of Present Illness 73 y.o. male with medical history significant for chronic combined systolic and diastolic CHF, permanent atrial fibrillation s/p AV nodal ablation and AICD, s/p mitral valve replacement with bioprosthetic valve, COPD on 2 L supplemental O2 via [place], history of CVA, history of CNS bleed, hypertension, hyperlipidemia, type 2 diabetes, history of lung cancer treated with wedge resection who presented to the ED for evaluation of dyspnea, x-ray showing bilateral pulmonary edema.    PT Comments    Pt tolerated treatment well, ambulating for increased distances. Pt with more significant gait deviations compared to evaluation, however pt contributes this to morning stiffness. Pt reports mild SOB with ambulation, saturating at or above 90% for first 80' and then desaturating to 78% at end of walk once seated before recovering within 2 minutes of sitting. PT notices no significant labored breathing. PT provides instruction on energy conservation techniques. PT also reinforces recommendation for pulmonary rehab however pt not agreeable at this time.   Follow Up Recommendations  (pulmonary rehab, pt not agreeable however)     Equipment Recommendations  None recommended by PT    Recommendations for Other Services       Precautions / Restrictions Precautions Precautions: Fall Precaution Comments: Watch O2 and BP    Mobility  Bed Mobility Overal bed mobility: (pt sitting edge of bed upon arrival)                Transfers Overall transfer level: Independent Equipment used: None Transfers: Sit to/from Stand Sit to Stand: Independent            Ambulation/Gait Ambulation/Gait assistance: Supervision Gait Distance (Feet): 140 Feet Assistive device: None Gait Pattern/deviations: Step-through pattern Gait velocity: reduced Gait  velocity interpretation: 1.31 - 2.62 ft/sec, indicative of limited community ambulator General Gait Details: step through gait with slight L trunk lean and decreased stance time on LLE   Stairs Stairs: (pt declines stair training)           Wheelchair Mobility    Modified Rankin (Stroke Patients Only)       Balance Overall balance assessment: Needs assistance Sitting-balance support: No upper extremity supported;Feet supported Sitting balance-Leahy Scale: Good     Standing balance support: No upper extremity supported Standing balance-Leahy Scale: Fair Standing balance comment: supervision for standing balance                            Cognition Arousal/Alertness: Awake/alert Behavior During Therapy: WFL for tasks assessed/performed Overall Cognitive Status: Within Functional Limits for tasks assessed                                        Exercises      General Comments        Pertinent Vitals/Pain Pain Assessment: 0-10 Pain Score: 2  Pain Location: chest Pain Descriptors / Indicators: Aching Pain Intervention(s): Limited activity within patient's tolerance    Home Living                      Prior Function            PT Goals (current goals can now be found in the care plan section) Acute Rehab PT Goals Patient Stated Goal: to go  home Progress towards PT goals: Progressing toward goals    Frequency    Min 2X/week      PT Plan Current plan remains appropriate    Co-evaluation              AM-PAC PT "6 Clicks" Mobility   Outcome Measure  Help needed turning from your back to your side while in a flat bed without using bedrails?: None Help needed moving from lying on your back to sitting on the side of a flat bed without using bedrails?: None Help needed moving to and from a bed to a chair (including a wheelchair)?: None Help needed standing up from a chair using your arms (e.g., wheelchair or  bedside chair)?: None Help needed to walk in hospital room?: None Help needed climbing 3-5 steps with a railing? : None 6 Click Score: 24    End of Session Equipment Utilized During Treatment: Gait belt Activity Tolerance: Patient tolerated treatment well Patient left: in bed;with call bell/phone within reach Nurse Communication: Mobility status PT Visit Diagnosis: Unsteadiness on feet (R26.81)     Time: 1314-3888 PT Time Calculation (min) (ACUTE ONLY): 16 min  Charges:  $Gait Training: 8-22 mins                     Zenaida Niece, PT, DPT Acute Rehabilitation Pager: [phone]    Zenaida Niece 04/30/2019, 10:06 AM

## 2019-04-30 NOTE — Progress Notes (Signed)
PROGRESS NOTE  [name] YKD:[id number] DOB: 03/29/1946 DOA: 04/27/2019 PCP: [name], NP  Brief History   Charles Camel Cookis a 73 y.o.malewith medical history significant forchronic combined systolic and diastolic CHF, permanent atrial fibrillation s/p AV nodal ablation and AICD, s/p mitral valve replacement with bioprosthetic valve, COPD on 2 L supplemental O2 via [place], history of CVA, history of CNS bleed, hypertension, hyperlipidemia, type 2 diabetes, history of lung cancer treated with wedge resection who presented to the ED for evaluation ofdyspnea. In the  ED O2 saturation dropped to 84% on 3 L O2 while standing. Chest x-ray showed pulmonary edema bilaterally.  The patient was admitted to a telemetry bed. Cardiology has been consulted. The patient's treatment has been thwarted by his hypotension, and elevated creatinine. Pt is felt to be at end stage CHF. Palliative care has been consulted.  Consultants   Cardiology  Palliative care  Procedures   None  Antibiotics   Anti-infectives (From admission, onward)   None     Subjective  The patient is resting comfortably. No new complaints.  Objective   Vitals:  Vitals:   04/30/19 0846 04/30/19 1125  BP: (!) 97/56 110/70  Pulse: 77 75  Resp: 18 18  Temp: 98.8 F (37.1 C) 98.2 F (36.8 C)  SpO2: 97% 99%   Exam:  Constitutional:   The patient is awake, alert, and oriented x 3. No acute distress. Respiratory:   No increased work of breathing.  No wheezes, rales, or rhonchi  No tactile fremitus Cardiovascular:   Regular rate and rhythm  No murmurs, ectopy, or gallups.  No lateral PMI. No thrills. Abdomen:   Abdomen is soft, non-tender, non-distended  No hernias, masses, or organomegaly  Normoactive bowel sounds.  Musculoskeletal:   No cyanosis, clubbing, or edema Skin:   No rashes, lesions, ulcers  palpation of skin: no induration or nodules Neurologic:   CN 2-12 intact  Sensation  all 4 extremities intact Psychiatric:   Mental status o Mood, affect appropriate o Orientation to person, place, time   judgment and insight appear intact  I have personally reviewed the following:   Today's Data   Vitals, BMP  Cardiology Data   Echocardiogram  Scheduled Meds:  aspirin EC  81 mg Oral Daily   atorvastatin  10 mg Oral Daily   enoxaparin (LOVENOX) injection  40 mg Subcutaneous Q24H   furosemide  40 mg Intravenous Q12H   insulin aspart  0-9 Units Subcutaneous TID WC   insulin glargine  12 Units Subcutaneous Daily   lidocaine  1 patch Transdermal Q24H   mouth rinse  15 mL Mouth Rinse BID   mometasone-formoterol  2 puff Inhalation BID   potassium chloride  20 mEq Oral Daily   sodium chloride flush  3 mL Intravenous Q12H   Continuous Infusions:  sodium chloride Stopped (04/28/19 1048)    Principal Problem:   Acute on chronic combined systolic (congestive) and diastolic (congestive) heart failure (HCC) Active Problems:   COPD GOLD II   Hypertension   Dyslipidemia   Permanent atrial fibrillation   ICD (implantable cardioverter-defibrillator), biventricular, in situ   S/P MVR (mitral valve replacement)   H/O atrioventricular nodal ablation   H/O: CVA (cerebrovascular accident)   Acute on chronic respiratory failure with hypoxia (HCC)   Diabetes ([place])   Acute on chronic combined systolic and diastolic CHF (congestive heart failure) ([place])   LOS: 2 days   A & P  Acute on chronic respiratory failure with hypoxia  due to acute on chronic combined systolic diastolic CHF exacerbation: Echocardiogram 08/01/2018 showed EF 35-40% with diffuse hypokinesis.Echo performed on 04/28/2019 demonstrates a reduction in EF to 25 - 30 % with global right ventricular hypokinesis. Bilateral pulmonary edema on CXR. Remains dyspneic this morning with bibasilar crackles on exam, continue IV Lasix 40 mg every 12 hours. Continue Coreg, but ARB held due to hypotension. Per  her Fluid balance the patient is down 4.5L L since admission, however furthe diuresis is limited by hypotension. Cardiology feels that the patient is at end stage CHF. Palliative care has been consulted. I appreciate cardiology's assistance.  COPD/chronic respiratory failure: the patient is on 2 to 3 L home O2 at baseline. He is currently saturating at 95% on 3 Liters. Continue Symbicort and as needed albuterol  A. fib s/p AV nodal ablation and AICD:Not on anticoagulation due to history of CNS bleed.  Controlled, continue Coreg.  DM II: HbA1c 7.5% on 04/12/2019. On metformin at home. Glucoses for the last 24 hours have been 117 - 178. The patient is receiving Lantus 12 units daily and SSI. Bump in glucoses may have been due to one dose of solumedrol the patient received in the ED on admission.  Anemia: Multifactorial. Iron deficiency and AOCD. The patient has received feraheme IV x 1. Monitor hemoglobin.   CKD: Creatinine at baseline at 1.14. Monitor creatinine, electrolytes, and volume status.  Hypertension: Currently stable on home dose of Coreg. ARB is held due to hypotension.  Hyperlipidemia: Continue atorvastatin.  History of CVA: Continue aspirin 81 mg daily.  I have seen and examined this patient myself. I have spent 32 minutes in his evaluation and care.  DVT prophylaxis: Add Lovenox Code Status:DNR Family Communication:Discussed with patient Disposition Plan: Pending clinical improvement   Pat Sires, DO [facility] Direct contact: see [email or web address]  7PM-7AM contact night coverage as above 04/30/2019, 2:19 PM  LOS: 1 day

## 2019-04-30 NOTE — Progress Notes (Addendum)
Progress Note  Patient Name: [name] Date of Encounter: 04/30/2019  Primary Cardiologist: Candee Furbish, MD   Subjective   Ambulated in the hall this morning and reports weakness and ongoing SOB. Overall SOB seems better than yesterday. He reports chest pain is down to 2/10. No complaints of palpitations, LE edema, or syncope.   Inpatient Medications    Scheduled Meds: . aspirin EC  81 mg Oral Daily  . atorvastatin  10 mg Oral Daily  . carvedilol  3.125 mg Oral BID WC  . enoxaparin (LOVENOX) injection  40 mg Subcutaneous Q24H  . furosemide  40 mg Intravenous Q12H  . insulin aspart  0-9 Units Subcutaneous TID WC  . insulin glargine  12 Units Subcutaneous Daily  . lidocaine  1 patch Transdermal Q24H  . mouth rinse  15 mL Mouth Rinse BID  . mometasone-formoterol  2 puff Inhalation BID  . potassium chloride  20 mEq Oral Daily  . sodium chloride flush  3 mL Intravenous Q12H   Continuous Infusions: . sodium chloride Stopped (04/28/19 1048)   PRN Meds: sodium chloride, acetaminophen, albuterol, nitroGLYCERIN, ondansetron (ZOFRAN) IV, sodium chloride flush   Vital Signs    Vitals:   04/29/19 2049 04/30/19 0456 04/30/19 0746 04/30/19 0846  BP: (!) 95/55 111/69  (!) 97/56  Pulse: 73 74 74 77  Resp: 16 16 18 18   Temp: 98 F (36.7 C) 98.6 F (37 C)  98.8 F (37.1 C)  TempSrc: Oral Oral  Oral  SpO2: 93% 94% 96% 97%  Weight:  72.4 kg    Height:        Intake/Output Summary (Last 24 hours) at 04/30/2019 1005 Last data filed at 04/30/2019 0948 Gross per 24 hour  Intake 1083 ml  Output 3700 ml  Net -2617 ml   Filed Weights   04/28/19 0538 04/29/19 0531 04/30/19 0456  Weight: 71.9 kg 71.8 kg 72.4 kg    Telemetry    V-paced with atrial flutter; HR in the 70s - Personally Reviewed Physical Exam   GEN: Chronically ill appearing gentleman, sitting upright in bed in no acute distress.   Neck: No JVD, no carotid bruits Cardiac: RRR, no murmurs, rubs, or gallops.   Respiratory: Clear to auscultation bilaterally, no wheezes/ rales/ rhonchi GI: NABS, Soft, nontender, non-distended  MS: No edema; No deformity. Neuro:  Nonfocal, moving all extremities spontaneously Psych: Normal affect   Labs    Chemistry Recent Labs  Lab 04/28/19 0617 04/29/19 0524 04/30/19 0308  NA 139 139 138  K 3.8 4.4 3.6  CL 102 102 102  CO2 25 29 27   GLUCOSE 208* 113* 112*  BUN 28* 27* 24*  CREATININE 1.08 1.14 1.05  CALCIUM 8.8* 8.6* 8.6*  GFRNONAA >60 >60 >60  GFRAA >60 >60 >60  ANIONGAP 12 8 9      Hematology Recent Labs  Lab 04/27/19 1502 04/27/19 2034 04/28/19 0617 04/29/19 0524  WBC 8.4  --  6.8 7.5  RBC 3.98* 4.43 4.02* 4.25  HGB 9.3*  --  8.9* 9.4*  HCT 31.6*  --  31.4* 33.3*  MCV 79.4*  --  78.1* 78.4*  MCH 23.4*  --  22.1* 22.1*  MCHC 29.4*  --  28.3* 28.2*  RDW 18.3*  --  18.2* 18.3*  PLT 233  --  229 219    Cardiac EnzymesNo results for input(s): TROPONINI in the last 168 hours. No results for input(s): TROPIPOC in the last 168 hours.   BNP Recent Labs  Lab 04/27/19 1502  BNP 630.0*     DDimer No results for input(s): DDIMER in the last 168 hours.   Radiology    No results found.  Cardiac Studies   Echocardiogram 04/28/2019: 1. Left ventricular ejection fraction, by visual estimation, is 25 to 30%. The left ventricle has moderate to severely decreased function. Mildly increased left ventricular size. There is mildly increased left ventricular hypertrophy. 2. Multiple segmental abnormalities exist. See findings. 3. Left ventricular diastolic Doppler parameters are indeterminate pattern of LV diastolic filling. 4. Global right ventricle has moderately reduced systolic function.The right ventricular size is normal. No increase in right ventricular wall thickness. 5. Left atrial size was severely dilated. 6. Right atrial size was severely dilated. 7. Mild aortic valve annular calcification. 8. The mitral valve has been  repaired/replaced. There is a 33 mm Medtronic Mosiac prosthesis in position. Trace mitral valve regurgitation. Mean gradient of 5 mmHg suggest mildly stenotic flow. 9. The tricuspid valve is grossly normal. Tricuspid valve regurgitation is mild. 10. The aortic valve is tricuspid Aortic valve regurgitation is trivial by color flow Doppler. 11. The pulmonic valve was grossly normal. Pulmonic valve regurgitation is not visualized by color flow Doppler. 12. The aortic root was not well visualized. 13. Proximal aorta and arch are not well defined. The aortic root appears dilated. CTA would define course better if clinically indicated. 14. Moderately elevated pulmonary artery systolic pressure. 15. A device wire is visualized. 16. The inferior vena cava is dilated in size with <50% respiratory variability, suggesting right atrial pressure of 15 mmHg. 17. The tricuspid regurgitant velocity is 3.19 m/s, and with an assumed right atrial pressure of 15 mmHg, the estimated right ventricular systolic pressure is moderately elevated at 55.7 mmHg.  Patient Profile     73 y.o. male with a PMH of chronic combined CHF, non-ischemic cardiomyopathy s/p Bi-V-ICD, permanent atrial fibrillation s/p AV nodal ablation, mitral valve endocarditis c/b severe MR s/p minimally invasive bioprosthetic MVR 2018, sinus of valsalva aneurysm, HTN, HLD, DM type 2, COPD on home O2, hx of intracranial hemorrhage, and lung cancer s/p resection, who is being followed by cardiology for the evaluation of atrial flutter, chest pain, and CHF  Assessment & Plan    1. Acute on chronic combined CHF: patient presented with SOB. Found to have EF 25-30% (previously 35-40% 07/2018).  He was started on IV lasix 40mg  BID with UOP net -2.2L in the past 24 hours with -4.2L this admission. Weight is up 1 lbs from yesterday to 159lbs. PPM interrogation with significant ventricular arrhythmias but suggested increased impedence. Soft blood pressures limit  titration of CHF medications. - Will continue IV lasix 40mg  BID; anticipate transition to po tomorrow - Continue carvedilol - BP continues to be low limiting addition of home ARB - Continue to monitor strict I&Os and daily weights.    2. Chest pain in patient with non-obstructive CAD: atypical sharp chest pain. TTP of left chest wall. Trops 19>17>15. Trend not consistent with ACS. Cardiac CTA 12/2018 reviewed with Dr. Meda Coffee and patient felt to not have significant obstructive coronary artery disease.  - No further ischemic evaluation at this time.  - Continue risk factor modifications below    3. Permanent atrial flutter with CVR: noted on EKG this admission, consistent with prior EKGs for the past several years. Rate well controlled in the 70s. Not on anticoagulation due to history of intracranial hemorrhage on coumadin in the past.  - Continue carvedilol for rate control  4.  HTN: BP soft. Home valsartan on hold.  - Continue carvedilol  5. Non-ischemic cardiomyopathy s/p BiV-ICD: Biventricular pacing on EKG this admission. No complaints of ICD shock.  - Continue diuresis as above  6. Sinus valsalva aneurysm: followed outpatient by Dr. Roxy Manns. Stable on cardiac CTA 12/2018 - Continue routine outpatient monitoring - Continue aggressive BP control   7. HLD: LDL 30 on recent lipid panel - Continue atorvastatin  8. Mitral valve endocarditis c/b severe MR s/p MVR in 2018: valve appeared stable on echo  - Continue routine monitoring.   9. DM type 2: Hgb A1C 7.5 04/12/2019; goal <7 - Continue management per primary team.   Palliative care consulted yesterday to establish [place] in this gentleman with end-stage CHF and multiple comorbidities.   For questions or updates, please contact [place] Please consult [email or web address] for contact info under Cardiology/STEMI.     Signed, Abigail Butts, PA-C  04/30/2019, 10:05 AM   [phone]  The patient was seen, examined and  discussed with Abigail Butts, PA-C  and I agree with the above.   Patient does not seem to be in significant fluid overload on physical exam however increased impedance, I will continue IV Lasix till tomorrow, then switch to Lasix 40 mg p.o. twice daily, creatinine has been stable and normal, overall his prognosis is very poor as this is end-stage CHF.  Atrial flutter is probably contributing to this however he is post AV nodal ablation and he cannot be anticoagulated given the fact that he had a prior cerebral hemorrhage.  His blood pressure is very soft, I will hold his carvedilol. We will consult palliative care to establish goals or care.  Ena Dawley, MD 04/30/2019

## 2019-05-01 DIAGNOSIS — E785 Hyperlipidemia, unspecified: Secondary | ICD-10-CM | POA: Diagnosis not present

## 2019-05-01 DIAGNOSIS — Z66 Do not resuscitate: Secondary | ICD-10-CM

## 2019-05-01 DIAGNOSIS — J449 Chronic obstructive pulmonary disease, unspecified: Secondary | ICD-10-CM | POA: Diagnosis not present

## 2019-05-01 DIAGNOSIS — I4821 Permanent atrial fibrillation: Secondary | ICD-10-CM | POA: Diagnosis not present

## 2019-05-01 DIAGNOSIS — Z95810 Presence of automatic (implantable) cardiac defibrillator: Secondary | ICD-10-CM | POA: Diagnosis not present

## 2019-05-01 DIAGNOSIS — R531 Weakness: Secondary | ICD-10-CM | POA: Diagnosis not present

## 2019-05-01 DIAGNOSIS — J9621 Acute and chronic respiratory failure with hypoxia: Secondary | ICD-10-CM | POA: Diagnosis not present

## 2019-05-01 DIAGNOSIS — E119 Type 2 diabetes mellitus without complications: Secondary | ICD-10-CM

## 2019-05-01 DIAGNOSIS — I10 Essential (primary) hypertension: Secondary | ICD-10-CM

## 2019-05-01 DIAGNOSIS — I5043 Acute on chronic combined systolic (congestive) and diastolic (congestive) heart failure: Secondary | ICD-10-CM | POA: Diagnosis not present

## 2019-05-01 LAB — BASIC METABOLIC PANEL
Anion gap: 9 (ref 5–15)
BUN: 20 mg/dL (ref 8–23)
CO2: 29 mmol/L (ref 22–32)
Calcium: 8.9 mg/dL (ref 8.9–10.3)
Chloride: 103 mmol/L (ref 98–111)
Creatinine, Ser: 1.11 mg/dL (ref 0.61–1.24)
GFR calc Af Amer: >60 mL/min (ref >60)
GFR calc non Af Amer: >60 mL/min (ref >60)
Glucose, Bld: 121 mg/dL — ABNORMAL HIGH (ref 70–99)
Potassium: 4.7 mmol/L (ref 3.5–5.1)
Sodium: 141 mmol/L (ref 135–145)

## 2019-05-01 LAB — GLUCOSE, CAPILLARY
Glucose-Capillary: 121 mg/dL — ABNORMAL HIGH (ref 70–99)
Glucose-Capillary: 146 mg/dL — ABNORMAL HIGH (ref 70–99)
Glucose-Capillary: 132 mg/dL — ABNORMAL HIGH (ref 70–99)
Glucose-Capillary: 177 mg/dL — ABNORMAL HIGH (ref 70–99)

## 2019-05-01 MED ORDER — CARVEDILOL 3.125 MG PO TABS
3.1250 mg | ORAL_TABLET | Freq: Two times a day (BID) | ORAL | Status: DC
  Administered 2019-05-01 – 2019-05-02 (×2): 3.125 mg via ORAL
  Filled 2019-05-01 (×2): qty 1

## 2019-05-01 MED ORDER — FUROSEMIDE 40 MG PO TABS
40.0000 mg | ORAL_TABLET | Freq: Two times a day (BID) | ORAL | Status: DC
  Administered 2019-05-01 – 2019-05-02 (×2): 40 mg via ORAL
  Filled 2019-05-01 (×2): qty 1

## 2019-05-01 NOTE — Telephone Encounter (Signed)
Calling to check on the status of request for 02 can be reached @ [phone].[name]

## 2019-05-01 NOTE — Consult Note (Signed)
Consultation Note Date: 05/01/2019   Patient Name: [name]  DOB: 21-Jun-1946  MRN: [id number]  Age / Sex: 73 y.o., male  PCP: [name], NP Referring Physician: Ezekiel Slocumb, DO  Reason for Consultation: Establishing goals of care and Psychosocial/spiritual support  HPI/Patient Profile: 73 y.o. male  admitted on 04/27/2019 with past   medical history significant for chronic combined systolic and diastolic CHF, permanent atrial fibrillation s/p AV nodal ablation and AICD, s/p mitral valve replacement with bioprosthetic valve, COPD on 2 L supplemental O2 via [place], history of CVA, history of CNS bleed, hypertension, hyperlipidemia, type 2 diabetes, history of lung cancer treated with wedge resection who presents to the ED for evaluation of dyspnea.  Patient reports 1 day of worsening dyspnea and orthopnea.  He has been having some lightheadedness without falls or syncope.  He has had some intermittent left-sided chest discomfort.  He says his doctor recently told him to increase his home O2 to 3 L supplemental O2 via [place] continuously at all times.  He has not noticed any increased swelling in his legs.  He has been taking Lasix 40 mg daily.  He denies any cough.  He denies any fevers, chills, diaphoresis, abdominal pain, dysuria.  ED Course:  Initial vitals showed BP 111/63, pulse 70, RR 19, temp 98.0 Fahrenheit, SPO2 90% on 3 L supplemental O2 via [place].  Per EDP O2 saturation dropped to 84% on 3 L O2 while standing.  Labs notable for WBC 8.4, hemoglobin 9.3, platelets 233,000, sodium 139, potassium 4.4, bicarb 23, BUN 25, creatinine 1.18, serum glucose 139, high-sensitivity troponin I 19, BNP 630.  Formal chest x-ray showed pulmonary edema bilaterally without definite pleural effusions.  Right-sided AICD is seen in place.  Patient was given IV Lasix 60 mg once, IV Solu-Medrol 80 mg once, oral  potassium 20 mEq once, and albuterol inhaler treatment.  Hospitalist service was consulted admit for further evaluation and management.  Patient is stabilized and his old today is to "get home" as soon as possible   Patient and family face treatment option decisions, advanced directive decisions and anticipatory care needs.  He is high risk for decompensation secondary to multiple comorbidities  Clinical Assessment and Goals of Care:   This NP Wadie Lessen reviewed medical records, received report from team, assessed the patient and then meet at the patient's bedside along with his daughter/ Tanay Misuraca # [phone]  to discuss diagnosis, prognosis, GOC,  and options.  Concept of Palliative Care was discussed  A  discussion was had today regarding advanced directives.  Concepts specific to code status, artifical feeding and hydration, continued IV antibiotics and rehospitalization was had.  The difference between a aggressive medical intervention path  and a palliative comfort care path for this patient at this time was had.  Values and goals of care important to patient and family were attempted to be elicited.  Patient himself was irritated with the conversation and had minimal participation however his daughter Olivia Mackie was involved and grateful for  the opportunity to have a goals of care discussion.  There is no documented healthcare power of attorney and at this time Mr. Simmering wife Gunnard Dorrance # [phone] would be active decision maker for this patient.   MOST form introduced and Hard Choices left for review   Questions and concerns addressed.   Family encouraged to call with questions or concerns.    PMT will continue to support holistically.     SUMMARY OF RECOMMENDATIONS    Code Status/Advance Care Planning:  DNR/DNI  Although patient participated minimally in today's discussion he was able to speak to his desire for all offered and available medical interventions to  prolong life.   Additional Recommendations (Limitations, Scope, Preferences):  Patient is open to all offered and  available medical interventions to prolong life  Psycho-social/Spiritual:   Desire for further Chaplaincy support:no  Additional Recommendations: Education on Hospice  Prognosis:  Unable to determine   Discussed with patient the importance of continued conversation with family and his medical providers regarding overall plan of care and treatment options,  ensuring decisions are within the context of the patients values and GOCs   Discharge Planning:  Recommended community-based palliative services but patient declined   Home with Home Health      Primary Diagnoses: Present on Admission: . Acute on chronic combined systolic (congestive) and diastolic (congestive) heart failure ([place]) . COPD GOLD II . Dyslipidemia . Hypertension . ICD (implantable cardioverter-defibrillator), biventricular, in situ . Permanent atrial fibrillation . Acute on chronic combined systolic and diastolic CHF (congestive heart failure) ([place])   I have reviewed the medical record, interviewed the patient and family, and examined the patient. The following aspects are pertinent.  Past Medical History:  Diagnosis Date  . Atrial fibrillation ([place])    AV Node ablation January, 2010, for rapid atrial fib  . Atrial septal defect    Closed with surgery January, 2010  . Automatic implantable cardioverter-defibrillator in situ    LV dysfunction and pacer needed for AV node lesion  . Cardiomyopathy    non-ischemic  . CHF (congestive heart failure) ([place])   . Chronic combined systolic and diastolic CHF (congestive heart failure) ([place])   . Colon polyps   . COPD (chronic obstructive pulmonary disease) (HCC)    O2- 2 liters, nasal cannula, q night   . COPD GOLD II 01/11/2007   PFT's 09/25/13  FEV1  2.18 (61%) ratio 64 no change p B2 and DLC0  32% corrects to 68% - trial off advair and acei rec  starting  08/23/2013     . CVA (cerebral vascular accident) ([place]) 2009   denies residual on 08/14/2013  . Diabetes mellitus without complication ([place])    type 2  . Dyslipidemia   . Dysrhythmia   . Ejection fraction < 50%   . Endocarditis    Bacterial, 2009  . Headache(784.0)    related to stroke only  . HLD (hyperlipidemia)   . Hypertension   . Intracranial hemorrhage (H[facility])    Coumadin cannot be used because of the history of his bleed  . Lung cancer ([place]) 11/29/2013   T1N0 Stage Ia non-small cell carcinoma left lung treated with wedge resection  . Mitral regurgitation    Severe symptomatic primary MR due to bacterial endocarditis, treated w/ MVR // Echo 1/18 EF 40-45, diffuse HK, dilated aorta at 42 mm/aortic root 47 mm; linear echogenic structure in ascending aorta - suspect reverberation artifact / consider CT or TEE to rule out dissection  flap, bioprosthetic MVR with mean gradient 3, severe LAE, low normal RVSF, severe RAE, moderate TR, PASP 42  . Myocardial infarction ([place]) 2010  . Pacemaker    combo pacer and icd  . Permanent atrial fibrillation    Originally Coumadin use for atrial fibrillation  //   he had intracerebral hemorrhage with an INR of 2.3 June, 2009. Anticoagulation could no longer be used.  //  Rapid atrial fibrillation after inferior MI October, 2010..........Marland Kitchen AV node ablation done at that time with ICD pacemaker placed (EF 35%).   //   Left atrial appendage tied off at the time of mitral valve surgery January, 2010 (maze pro  . Pneumonia 07/2018  . Prosthetic valve dysfunction    Mild mitral stenosis  . Pulmonary hypertension (HCC)    Moderate  . Renal artery stenosis (HCC)    Mild by history  . 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 Spontaneous pneumothorax    right thoracotomy - distant past  . Status post minimally invasive mitral valve replacement with bioprosthetic valve    33 mm Medtronic Mosaic porcine bioprosthesis placed via right mini thoracotomy for  bacterial endocarditis complicated by severe MR and CHF   . Thoracic aortic aneurysm ([place]) 08/11/2016   a - Chest CTA 1/18:  Aneurysmal dilatation of aortic root is noted at 5.1 cm.    Social History   Socioeconomic History  . Marital status: Married    Spouse name: [name]  . Number of children: 0  . Years of education: College  . Highest education level: Not on file  Occupational History  . 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 Financial resource strain: Not on file  . Food insecurity    Worry: Not on file    Inability: Not on file  . Transportation needs    Medical: Not on file    Non-medical: Not on file  Tobacco Use  . Smoking status: Former Smoker    Packs/day: 1.00    Years: 45.00    Pack years: 45.00    Types: Cigarettes    Quit date: 08/11/2013    Years since quitting: 5.7  . Smokeless tobacco: Never Used  . Tobacco comment: 08/14/2013 "quit smoking in 2009"  Substance and Sexual Activity  . Alcohol use: No    Alcohol/week: 0.0 standard drinks    Comment: 08/14/2013 "used to drink beer; quit:in 1982"  . Drug use: No  . Sexual activity: Yes  Lifestyle  . Physical activity    Days per week: Not on file    Minutes per session: Not on file  . Stress: Not on file  Relationships  . Social Herbalist on phone: Not on file    Gets together: Not on file    Attends religious service: Not on file    Active member of club or organization: Not on file    Attends meetings of clubs or organizations: Not on file    Relationship status: Not on file  Other Topics Concern  . Not on file  Social History Narrative   Patient lives at home with his spouse.   Caffeine Use: none   Worked for the post [employer]   Has two boys and a girl. All live local.    Family History  Problem Relation Age of Onset  . Stomach cancer Father   . Stroke Mother    Scheduled Meds: . aspirin EC  81 mg Oral Daily  . atorvastatin  10 mg Oral  Daily  . carvedilol  3.125 mg Oral BID WC  . enoxaparin (LOVENOX) injection  40 mg Subcutaneous Q24H  . furosemide  40 mg Oral BID  . insulin aspart  0-9 Units Subcutaneous TID WC  . insulin glargine  12 Units Subcutaneous Daily  . lidocaine  1 patch Transdermal Q24H  . mouth rinse  15 mL Mouth Rinse BID  . mometasone-formoterol  2 puff Inhalation BID  . potassium chloride  20 mEq Oral Daily  . sodium chloride flush  3 mL Intravenous Q12H   Continuous Infusions: . sodium chloride Stopped (04/28/19 1048)   PRN Meds:.sodium chloride, acetaminophen, albuterol, nitroGLYCERIN, ondansetron (ZOFRAN) IV, sodium chloride flush Medications Prior to Admission:  Prior to Admission medications   Medication Sig Start Date End Date Taking? Authorizing Provider  acetaminophen (TYLENOL) 325 MG tablet Take 325-650 mg by mouth every 8 (eight) hours as needed for headache.   Yes [provider]  albuterol (PROVENTIL HFA) 108 (90 Base) MCG/ACT inhaler Inhale 2 puffs into the lungs every 6 (six) hours as needed for wheezing or shortness of breath.   Yes [provider]  amoxicillin (AMOXIL) 500 MG capsule Take 2,000 mg by mouth See admin instructions. Take 2,000 mg by mouth one hour prior to dental procedures   Yes [provider]  aspirin EC 81 MG tablet Take 1 tablet (81 mg total) by mouth daily. 12/07/12  Yes Carlena Bjornstad, MD  atorvastatin (LIPITOR) 10 MG tablet TAKE 1 TABLET BY MOUTH EVERY DAY Patient taking differently: Take 10 mg by mouth daily.  04/09/19  Yes Nafziger, Tommi Rumps, NP  budesonide-formoterol (SYMBICORT) 160-4.5 MCG/ACT inhaler INHALE 2 PUFFS INTO THE LUNGS TWICE A DAY Patient taking differently: Inhale 2 puffs into the lungs 2 (two) times daily.  10/22/18  Yes Lauraine Rinne, NP  carvedilol (COREG) 3.125 MG tablet TAKE 1 TABLET BY MOUTH TWICE A DAY WITH MEALS Patient taking differently: Take 3.125 mg by mouth 2 (two) times daily with a meal.  03/19/19  Yes Nafziger,  Tommi Rumps, NP  furosemide (LASIX) 40 MG tablet TAKE 1 TABLET BY MOUTH EVERY DAY Patient taking differently: Take 40 mg by mouth daily.  04/09/19  Yes Nafziger, Tommi Rumps, NP  metFORMIN (GLUCOPHAGE) 1000 MG tablet TAKE 1 TABLET BY MOUTH TWICE A DAY WITH MEALS Patient taking differently: Take 1,000 mg by mouth 2 (two) times daily with a meal.  03/29/19  Yes Nafziger, Tommi Rumps, NP  nitroGLYCERIN (NITROSTAT) 0.4 MG SL tablet Place 0.4 mg under the tongue every 5 (five) minutes x 3 doses as needed for chest pain.    Yes [provider]  OXYGEN Inhale 2-3 L/min into the lungs continuous.    Yes [provider]  valsartan (DIOVAN) 40 MG tablet TAKE 1 TABLET BY MOUTH EVERY DAY Patient taking differently: Take 40 mg by mouth daily.  03/29/19  Yes Nafziger, Tommi Rumps, NP   Allergies  Allergen Reactions  . Anticoagulant Compound Other (See Comments)    Pt had intracranial bleed, therefore all anticoagulation is contraindicated per Dr. Ron Parker  . Other Other (See Comments)    Per Dr. Halford Chessman (Surgeon): stated that the patient cannot be put under for any surgery, as he has an enlarged aorta. He would stand only a 50/50 chance of surviving. He has lung issues, diminished lung tissue, COPD, and emphysema.  . Warfarin Sodium Other (See Comments)    Pt had intracranial bleed, therefore  all anticoagulation is contraindicated per Dr. Ron Parker   Review of Systems  Physical Exam Cardiovascular:     Rate and Rhythm: Normal rate.  Pulmonary:     Effort: Pulmonary effort is normal.  Skin:    General: Skin is warm and dry.  Neurological:     Mental Status: He is oriented to person, place, and time.     Vital Signs: BP 112/68   Pulse 73   Temp 98.7 F (37.1 C) (Oral)   Resp 16   Ht 5\' 11"  (1.803 m)   Wt 69.4 kg   SpO2 98%   BMI 21.34 kg/m  Pain Scale: 0-10   Pain Score: 2    SpO2: SpO2: 98 % O2 Device:SpO2: 98 % O2 Flow Rate: .O2 Flow Rate (L/min): 3 L/min  IO: Intake/output summary:    Intake/Output Summary (Last 24 hours) at 05/01/2019 1233 Last data filed at 05/01/2019 1058 Gross per 24 hour  Intake 1404 ml  Output 676 ml  Net 728 ml    LBM: Last BM Date: 05/01/19 Baseline Weight: Weight: 73 kg Most recent weight: Weight: 69.4 kg     Palliative Assessment/Data: 60 %   Discussed with Dr Arbutus Ped   Time In: 1440 Time Out: 1600 Time Total: 70 minutes Greater than 50%  of this time was spent counseling and coordinating care related to the above assessment and plan.  Signed by: Wadie Lessen, NP   Please contact Palliative Medicine Team phone at [phone] for questions and concerns.  For individual provider: See Shea Evans

## 2019-05-01 NOTE — Progress Notes (Addendum)
Progress Note  Patient Name: [name] Date of Encounter: 05/01/2019  Primary Cardiologist: Candee Furbish, MD   Subjective   Pt states he dropped his O2 when walking. [place] now on 2 L, he takes 3 L at home.  Inpatient Medications    Scheduled Meds: . aspirin EC  81 mg Oral Daily  . atorvastatin  10 mg Oral Daily  . enoxaparin (LOVENOX) injection  40 mg Subcutaneous Q24H  . furosemide  40 mg Intravenous Q12H  . insulin aspart  0-9 Units Subcutaneous TID WC  . insulin glargine  12 Units Subcutaneous Daily  . lidocaine  1 patch Transdermal Q24H  . mouth rinse  15 mL Mouth Rinse BID  . mometasone-formoterol  2 puff Inhalation BID  . potassium chloride  20 mEq Oral Daily  . sodium chloride flush  3 mL Intravenous Q12H   Continuous Infusions: . sodium chloride Stopped (04/28/19 1048)   PRN Meds: sodium chloride, acetaminophen, albuterol, nitroGLYCERIN, ondansetron (ZOFRAN) IV, sodium chloride flush   Vital Signs    Vitals:   04/30/19 1924 04/30/19 2008 05/01/19 0456 05/01/19 0728  BP:  116/61 108/66   Pulse:  71 73   Resp:  20 20   Temp:  97.8 F (36.6 C) 98.1 F (36.7 C)   TempSrc:  Oral Oral   SpO2: 94% 90% 91% 93%  Weight:   69.4 kg   Height:        Intake/Output Summary (Last 24 hours) at 05/01/2019 0912 Last data filed at 05/01/2019 0700 Gross per 24 hour  Intake 1182 ml  Output 1001 ml  Net 181 ml   Last 3 Weights 05/01/2019 04/30/2019 04/29/2019  Weight (lbs) 153 lb 159 lb 9.8 oz 158 lb 4.8 oz  Weight (kg) 69.4 kg 72.4 kg 71.804 kg      Telemetry    Atrial flutter, with V pacing - Personally Reviewed  ECG    No new tracings - Personally Reviewed  Physical Exam   GEN: No acute distress.   Neck: mild JVD Cardiac: RRR, no murmurs, rubs, or gallops.  Respiratory: Clear to auscultation bilaterally. GI: Soft, nontender, non-distended  MS: No edema; No deformity. Neuro:  Nonfocal  Psych: Normal affect   Labs    High Sensitivity Troponin:    Recent Labs  Lab 04/27/19 1502 04/27/19 1930 04/29/19 1105 04/29/19 1405  TROPONINIHS 19* 17 15 16       Chemistry Recent Labs  Lab 04/29/19 0524 04/30/19 0308 05/01/19 0501  NA 139 138 141  K 4.4 3.6 4.7  CL 102 102 103  CO2 29 27 29   GLUCOSE 113* 112* 121*  BUN 27* 24* 20  CREATININE 1.14 1.05 1.11  CALCIUM 8.6* 8.6* 8.9  GFRNONAA >60 >60 >60  GFRAA >60 >60 >60  ANIONGAP 8 9 9      Hematology Recent Labs  Lab 04/27/19 1502 04/27/19 2034 04/28/19 0617 04/29/19 0524  WBC 8.4  --  6.8 7.5  RBC 3.98* 4.43 4.02* 4.25  HGB 9.3*  --  8.9* 9.4*  HCT 31.6*  --  31.4* 33.3*  MCV 79.4*  --  78.1* 78.4*  MCH 23.4*  --  22.1* 22.1*  MCHC 29.4*  --  28.3* 28.2*  RDW 18.3*  --  18.2* 18.3*  PLT 233  --  229 219    BNP Recent Labs  Lab 04/27/19 1502  BNP 630.0*     DDimer No results for input(s): DDIMER in the last 168 hours.  Radiology    No results found.  Cardiac Studies   Echocardiogram 04/28/2019: 1. Left ventricular ejection fraction, by visual estimation, is 25 to 30%. The left ventricle has moderate to severely decreased function. Mildly increased left ventricular size. There is mildly increased left ventricular hypertrophy. 2. Multiple segmental abnormalities exist. See findings. 3. Left ventricular diastolic Doppler parameters are indeterminate pattern of LV diastolic filling. 4. Global right ventricle has moderately reduced systolic function.The right ventricular size is normal. No increase in right ventricular wall thickness. 5. Left atrial size was severely dilated. 6. Right atrial size was severely dilated. 7. Mild aortic valve annular calcification. 8. The mitral valve has been repaired/replaced. There is a 33 mm Medtronic Mosiac prosthesis in position. Trace mitral valve regurgitation. Mean gradient of 5 mmHg suggest mildly stenotic flow. 9. The tricuspid valve is grossly normal. Tricuspid valve regurgitation is mild. 10. The aortic  valve is tricuspid Aortic valve regurgitation is trivial by color flow Doppler. 11. The pulmonic valve was grossly normal. Pulmonic valve regurgitation is not visualized by color flow Doppler. 12. The aortic root was not well visualized. 13. Proximal aorta and arch are not well defined. The aortic root appears dilated. CTA would define course better if clinically indicated. 14. Moderately elevated pulmonary artery systolic pressure. 15. A device wire is visualized. 16. The inferior vena cava is dilated in size with <50% respiratory variability, suggesting right atrial pressure of 15 mmHg. 17. The tricuspid regurgitant velocity is 3.19 m/s, and with an assumed right atrial pressure of 15 mmHg, the estimated right ventricular systolic pressure is moderately elevated at 55.7 mmHg.  Patient Profile     73 y.o. male with a PMH of chronic combined CHF, non-ischemic cardiomyopathy s/pBi-V-ICD, permanent atrial fibrillation s/p AV nodal ablation, mitral valve endocarditis c/b severe MR s/p minimally invasive bioprosthetic MVR 2018,sinus of valsalvaaneurysm, HTN, HLD, DM type 2, COPD on home O2, hx of intracranial hemorrhage, and lung cancer s/p resection, who is being followed by cardiology for the evaluation ofatrial flutter, chest pain, and CHF.  Assessment & Plan    1. Acute on chronic combined systolic heart failure 2. Nonischemic cardiomyopathy s/p BiV-ICD - EF 25-30% (previously 35-40% in Jan 2020) - PPM interrogation with significant arrhythmias suggesting increased impedence - diuresing on 40 mg IV lasix BID - overall net negative 4L with 1.2 L urine output yesterday - weight 153 lbs, down from 160 lbs on admission - sCr 1.11 - K is 4.7 - O2 is down to 2 L at rest, he is normally on 3 L at home, but states he continues to drop his O2 sats with ambulation - agree with transitioning lasix to PO dosing - home dose if 40 mg daily - BP in the 100-110s - appears to be on valsartan 40 mg at  home, not yet started due to marginal  pressures  3. Chest pain 4. Nonobstructive CAD - troponin trend mild and flat, inconsistent with ACS - cardiac CT reviewed and felt to have nonobstructive CAD - continues to complain of left sided chest wall pain with ambulation  5. Permanent atrial flutter with controlled ventricular rate - continue BB - ventricular rate in the 70s with pacing - biatrial dilation on echo  6. 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 Roxy Manns - stable on cardiac CT 12/2018 - agressive BP control  7. Hyperlipidemia  - continue lipitor - 04/12/2019: Cholesterol 85; HDL 45.30; LDL Cholesterol 20; Triglycerides 95.0; VLDL 19.0  8. Mitral  valve endocarditis - severe MR s/p MVR in 2018 - stable on echo  9. DM2 - A1c 7.5%, goal < 7%  For questions or updates, please contact [place] Please consult [email or web address] for contact info under     Signed, Ledora Bottcher, PA  05/01/2019, 9:12 AM    The patient was seen, examined and discussed with Minette Brine , PA-C and I agree with the above.   Patient's I's and O's are not accurate, however he has decreased his weight by 7 pounds since admission, he became hypoxic while walking.  I would switch to p.o. Lasix 40 mg twice daily, his creatinine is stable, I would arrange for home O2 evaluation.  We are still waiting palliative care consult to establish goals of care as he is in end-stage CHF and not a candidate for further advanced therapies. Atrial flutter is probably contributing to this however he is post AV nodal ablation and he cannot be anticoagulated given the fact that he had a prior cerebral hemorrhage.   I will restart low-dose carvedilol.   Ena Dawley, MD 05/01/2019

## 2019-05-01 NOTE — Telephone Encounter (Signed)
Called [employer] billing department [phone]. Confirmed paperwork has been forwarded to authorizations. He stated they will reach out to the patient once they have completed the process on their end.   I tried to call the patient and his wife back on the contact number provided and the line was busy on each attempt (there were 4).

## 2019-05-01 NOTE — Progress Notes (Signed)
Occupational Therapy Treatment Patient Details Name: [name] MRN: [id number] DOB: 06-Apr-1946 Today's Date: 05/01/2019    History of present illness 73 y.o. male with medical history significant for chronic combined systolic and diastolic CHF, permanent atrial fibrillation s/p AV nodal ablation and AICD, s/p mitral valve replacement with bioprosthetic valve, COPD on 2 L supplemental O2 via [place], history of CVA, history of CNS bleed, hypertension, hyperlipidemia, type 2 diabetes, history of lung cancer treated with wedge resection who presented to the ED for evaluation of dyspnea, x-ray showing bilateral pulmonary edema.   OT comments   Pt MIN A to don hospital gown at EOB. Pt completed simulated toilet transfer with MIN A needing initial assist for sit>stand to maintain balance with no AD. Pt complete functional mobility with no AD in hallway. Pt O2 de-sat to 74 after functional mobility on 2L via [place]. Pt required ~3 minutes of a seated rest break with education on pursed lip breathing for O2 to rebound back to 90. Pt 93% O2 HR 81 at end of session. Pt continues to not be receptive to [facility] services, stating he won't be home enough to participate. Education provided on benefit of [facility] services with pt partly agreeable by end of session. DC plan remains appropriate. Will continue to follow for acute OT needs.    Follow Up Recommendations  Home health OT;Supervision/Assistance - 24 hour    Equipment Recommendations  Tub/shower seat    Recommendations for Other Services      Precautions / Restrictions Precautions Precautions: Fall Precaution Comments: Watch O2 and BP       Mobility Bed Mobility Overal bed mobility: Modified Independent Bed Mobility: Supine to Sit     Supine to sit: Modified independent (Device/Increase time)     General bed mobility comments: no physical assist; use of bed features  Transfers Overall transfer level: Needs assistance Equipment used:  None Transfers: Sit to/from Stand Sit to Stand: Independent;Supervision         General transfer comment: supervision for safety; no physical assist    Balance Overall balance assessment: Needs assistance Sitting-balance support: No upper extremity supported;Feet supported Sitting balance-Leahy Scale: Good     Standing balance support: No upper extremity supported Standing balance-Leahy Scale: Fair Standing balance comment: supervision for standing balance; pt requires assist during initial sit>stand to sustain standing balance                           ADL either performed or assessed with clinical judgement   ADL Overall ADL's : Needs assistance/impaired                 Upper Body Dressing : Minimal assistance;Sitting;Set up Upper Body Dressing Details (indicate cue type and reason): MIN to don<>doff gown at EOB     Toilet Transfer: Minimal assistance;Ambulation Toilet Transfer Details (indicate cue type and reason): simulated to EOB;  MIN for initial standing balance upon sit>stand       Tub/Shower Transfer Details (indicate cue type and reason): education provided on using seat in shower with pt stating he "does not need that" Functional mobility during ADLs: Minimal assistance General ADL Comments: MIN A UB ADL; MIN A for funcitonal mobility with no AD     Vision Baseline Vision/History: Wears glasses Wears Glasses: At all times Patient Visual Report: No change from baseline Vision Assessment?: No apparent visual deficits   Perception     Praxis  Cognition   Behavior During Therapy: WFL for tasks assessed/performed;Flat affect Overall Cognitive Status: Within Functional Limits for tasks assessed                                          Exercises     Shoulder Instructions       General Comments      Pertinent Vitals/ Pain       Pain Score: 2  Pain Location: chest Pain Descriptors / Indicators:  Aching;Discomfort Pain Intervention(s): Monitored during session;Repositioned  Home Living                                          Prior Functioning/Environment              Frequency  Min 2X/week        Progress Toward Goals  OT Goals(current goals can now be found in the care plan section)  Progress towards OT goals: Progressing toward goals  Acute Rehab OT Goals Patient Stated Goal: to go home OT Goal Formulation: With patient Time For Goal Achievement: 05/13/19 Potential to Achieve Goals: Good  Plan Discharge plan remains appropriate    Co-evaluation                 AM-PAC OT "6 Clicks" Daily Activity     Outcome Measure   Help from another person eating meals?: None Help from another person taking care of personal grooming?: A Little Help from another person toileting, which includes using toliet, bedpan, or urinal?: A Little Help from another person bathing (including washing, rinsing, drying)?: A Little Help from another person to put on and taking off regular upper body clothing?: A Little Help from another person to put on and taking off regular lower body clothing?: A Little 6 Click Score: 19    End of Session Equipment Utilized During Treatment: Oxygen;Gait belt;Other (comment)(2L O2 via [place])  OT Visit Diagnosis: Unsteadiness on feet (R26.81);Muscle weakness (generalized) (M62.81)   Activity Tolerance Patient tolerated treatment well   Patient Left in bed;with call bell/phone within reach   Nurse Communication Mobility status        Time: 7209-4709 OT Time Calculation (min): 19 min  Charges: OT General Charges $OT Visit: 1 Visit OT Treatments $Self Care/Home Management : 8-22 mins  [place], [place] [phone] [place] 05/01/2019, 9:04 AM

## 2019-05-01 NOTE — Telephone Encounter (Addendum)
Called and was able to reach patient spouse [name]. She stated that [facility] is not within in network with [employer] so the related charges will not be covered.  [name] said she talked with [place] with [employer] (DME in the [place]) they would need the order and a copy of the office notes so they can process the request.  New order placed with [facility] or [employer] to not process the original request made on 04/11/19.  Nothing further needed at this time. Skype message and verbal conversation with [facility] - Vella Kohler to advise.

## 2019-05-01 NOTE — Progress Notes (Signed)
PROGRESS NOTE    [name]  YBO:[id number] DOB: [birth year] DOA: 04/27/2019 PCP: [name], NP    Brief Narrative:  [name] a 73 y.o.malewith medical history significant forchronic combined systolic and diastolic CHF, permanent atrial fibrillation s/p AV nodal ablation and AICD, s/p mitral valve replacement with bioprosthetic valve, COPD on 2 L supplemental O2 via [place], history of CVA, history of CNS bleed, hypertension, hyperlipidemia, type 2 diabetes, history of lung cancer treated with wedge resection who presentedto the ED for evaluation ofdyspnea. In the  EDO2 saturation dropped to 84% on 3 L O2 while standing. Chest x-ray showed pulmonary edema bilaterally.  The patient was admitted to a telemetry bed. Cardiology has been consulted. The patient's treatment has been thwarted by his hypotension, and elevated creatinine. Pt is felt to be at end stage CHF. Palliative care has been consulted and met with patient and daughter today (10/21).  Patient wishes to remain a DNR, but otherwise wants to proceed with all therapies to treat his heart failure.   Assessment & Plan:   Principal Problem:   Acute on chronic combined systolic (congestive) and diastolic (congestive) heart failure (HCC) Active Problems:   COPD GOLD II   Hypertension   Dyslipidemia   Permanent atrial fibrillation   ICD (implantable cardioverter-defibrillator), biventricular, in situ   S/P MVR (mitral valve replacement)   H/O atrioventricular nodal ablation   H/O: CVA (cerebrovascular accident)   Acute on chronic respiratory failure with hypoxia (HCC)   Diabetes ([place])   Acute on chronic combined systolic and diastolic CHF (congestive heart failure) (HCC)   Acute on chronic respiratory failure with hypoxia due to  Acute on chronic combined systolic/diastolic CHF exacerbation:  Due to non-ischemic cardiomyopathy.  Echo on 04/28/2019 demonstrates a reduction in EF to 25-30 % (from 35-40% in Jan  2020) with global right ventricular hypokinesis. Bilateral pulmonary edema on CXR. Minimal crackles appreciated on today's exam, appears mildly dyspneic.  Fluid balance net negative 4979 cc for this admission.  Cardiology's assessment is this is end stage heart failure.  Palliative care met with patient and daughter today, patient expressed wish to continue treating this condition as best as possible, declined palliative services on discharge. - cardiology following  - transitioned to oral Lasix 40 mg daily today - Continue Coreg - ARB held due to hypotension - sodium & fluid restriction - strict I/O's and daily weights - supplemental O2, increase with ambulation  COPD/chronic respiratory failure: the patient is on 2 to 3 L home O2 at baseline, now stable on 3 L/min.  - Continue Symbicort, albuterol PRN - ensure home oxygen  Permanent A. Flutter: rate controlled, paced in 70's s/p AV nodal ablation and AICD:  - no anticoagulation due to history of CNS bleed - continue Coreg  History of Mitral Valve Endocarditis: s/p MVR in 2018. Stable  Type 2 Diabetes: HbA1c 7.5% on 04/12/2019.  - hold home metformin - Lantus 12 units daily + sliding scale coverage  Anemia: Multifactorial with iron deficiency and anemia of chronic disease. The patient has received feraheme IV x 1.  - CBC qAM to monitor Hbg   CKD: Creatinine at baseline at 1.14.  - daily BMP to monitor creatinine, electrolytes - monitor volume status as above  Hypertension: Currently with soft BP's.  Otherwise stable on home dose of Coreg. ARB is held due to hypotension.  Hyperlipidemia: Continue atorvastatin  History of CVA: Continue aspirin 81 mg daily   DVT prophylaxis: Lovenox Code Status:  Code Status: DNR Family Communication:  Disposition Plan:  (Include barriers to DC)   Consultants:   Cardiology  Procedures:   Echo 10/18 EF 25-30%  Antimicrobials: (specify start and planned stop date.)  None     Subjective: Patient seen and examined, sitting up in bed.  States he feels about the same.  Denies chest pain.  Reports ongoing but stable SOB.  No fevers/chills, N/V/D.  Objective: Vitals:   05/01/19 0728 05/01/19 1047 05/01/19 1110 05/01/19 1239  BP:  112/68  (!) 105/58  Pulse:  73  72  Resp:  16  16  Temp:  98.7 F (37.1 C)    TempSrc:  Oral Oral   SpO2: 93% 98%  96%  Weight:      Height:        Intake/Output Summary (Last 24 hours) at 05/01/2019 1402 Last data filed at 05/01/2019 1314 Gross per 24 hour  Intake 1404 ml  Output 1701 ml  Net -297 ml   Filed Weights   04/29/19 0531 04/30/19 0456 05/01/19 0456  Weight: 71.8 kg 72.4 kg 69.4 kg    Examination:  General exam: awake, alert, no acute distress, frail, underweight HEENT: atraumatic, clear conjunctiva, anicteric sclera, hearing difficulty  Respiratory system: clear to auscultation bilaterally, no wheezes, rales or rhonchi, normal respiratory effort. Cardiovascular system: normal S1/S2, RRR, no murmurs, rubs, gallops, no pedal edema.   Gastrointestinal system: soft, non-tender, non-distended abdomen, normal bowel sounds. Central nervous system: alert and oriented, no gross focal neurologic deficits Extremities: moves all, no edema, normal tone Skin: dry, intact, normal temperature,  Psychiatry: normal mood, congruent affect   Data Reviewed: I have personally reviewed following labs and imaging studies  CBC: Recent Labs  Lab 04/27/19 1502 04/28/19 0617 04/29/19 0524  WBC 8.4 6.8 7.5  NEUTROABS 6.8  --   --   HGB 9.3* 8.9* 9.4*  HCT 31.6* 31.4* 33.3*  MCV 79.4* 78.1* 78.4*  PLT 233 229 893   Basic Metabolic Panel: Recent Labs  Lab 04/27/19 1502 04/28/19 0617 04/29/19 0524 04/30/19 0308 05/01/19 0501  NA 139 139 139 138 141  K 4.4 3.8 4.4 3.6 4.7  CL 104 102 102 102 103  CO2 _0 GLUCOSE 139* 208* 113* 112* 121*  BUN 25* 28* 27* 24* 20  CREATININE 1.18 1.08 1.14 1.05 1.11   CALCIUM 8.9 8.8* 8.6* 8.6* 8.9  MG  --  1.9  --   --   --    GFR: Estimated Creatinine Clearance: 59.9 mL/min (by C-G formula based on SCr of 1.11 mg/dL). Liver Function Tests: No results for input(s): AST, ALT, ALKPHOS, BILITOT, PROT, ALBUMIN in the last 168 hours. No results for input(s): LIPASE, AMYLASE in the last 168 hours. No results for input(s): AMMONIA in the last 168 hours. Coagulation Profile: No results for input(s): INR, PROTIME in the last 168 hours. Cardiac Enzymes: No results for input(s): CKTOTAL, CKMB, CKMBINDEX, TROPONINI in the last 168 hours. BNP (last 3 results) No results for input(s): PROBNP in the last 8760 hours. HbA1C: No results for input(s): HGBA1C in the last 72 hours. CBG: Recent Labs  Lab 04/30/19 1122 04/30/19 1604 04/30/19 2113 05/01/19 0534 05/01/19 1108  GLUCAP 178* 139* 133* 121* 146*   Lipid Profile: No results for input(s): CHOL, HDL, LDLCALC, TRIG, CHOLHDL, LDLDIRECT in the last 72 hours. Thyroid Function Tests: No results for input(s): TSH, T4TOTAL, FREET4, T3FREE, THYROIDAB in the last 72 hours. Anemia Panel: No results for  input(s): VITAMINB12, FOLATE, FERRITIN, TIBC, IRON, RETICCTPCT in the last 72 hours. Sepsis Labs: No results for input(s): PROCALCITON, LATICACIDVEN in the last 168 hours.  Recent Results (from the past 240 hour(s))  SARS CORONAVIRUS 2 (TAT 6-24 HRS) Nasopharyngeal Nasopharyngeal Swab     Status: None   Collection Time: 04/27/19  3:02 PM   Specimen: Nasopharyngeal Swab  Result Value Ref Range Status   SARS Coronavirus 2 NEGATIVE NEGATIVE Final    Comment: (NOTE) SARS-CoV-2 target nucleic acids are NOT DETECTED. The SARS-CoV-2 RNA is generally detectable in upper and lower respiratory specimens during the acute phase of infection. Negative results do not preclude SARS-CoV-2 infection, do not rule out co-infections with other pathogens, and should not be used as the sole basis for treatment or other patient  management decisions. Negative results must be combined with clinical observations, patient history, and epidemiological information. The expected result is Negative. Fact Sheet for Patients: SugarRoll.be Fact Sheet for Healthcare Providers: https://www.woods-mathews.com/ This test is not yet approved or cleared by the [place] FDA and  has been authorized for detection and/or diagnosis of SARS-CoV-2 by FDA under an Emergency Use Authorization (EUA). This EUA will remain  in effect (meaning this test can be used) for the duration of the COVID-19 declaration under Section 56 4(b)(1) of the Act, 21 U.S.C. section 360bbb-3(b)(1), unless the authorization is terminated or revoked sooner. Performed at [facility], [place] [address], [place], [place] [address]          Radiology Studies: No results found.      Scheduled Meds: . aspirin EC  81 mg Oral Daily  . atorvastatin  10 mg Oral Daily  . carvedilol  3.125 mg Oral BID WC  . enoxaparin (LOVENOX) injection  40 mg Subcutaneous Q24H  . furosemide  40 mg Oral BID  . insulin aspart  0-9 Units Subcutaneous TID WC  . insulin glargine  12 Units Subcutaneous Daily  . lidocaine  1 patch Transdermal Q24H  . mouth rinse  15 mL Mouth Rinse BID  . mometasone-formoterol  2 puff Inhalation BID  . potassium chloride  20 mEq Oral Daily  . sodium chloride flush  3 mL Intravenous Q12H   Continuous Infusions: . sodium chloride Stopped (04/28/19 1048)     LOS: 3 days    Time spent: 35-40 min    Ezekiel Slocumb, DO [facility] Pager: [phone]  If 7PM-7AM, please contact night-coverage [email or web address] Password [email or web address] 05/01/2019, 2:02 PM

## 2019-05-02 ENCOUNTER — Telehealth: Payer: Self-pay

## 2019-05-02 DIAGNOSIS — J449 Chronic obstructive pulmonary disease, unspecified: Secondary | ICD-10-CM | POA: Diagnosis not present

## 2019-05-02 DIAGNOSIS — R531 Weakness: Secondary | ICD-10-CM

## 2019-05-02 DIAGNOSIS — Z66 Do not resuscitate: Secondary | ICD-10-CM

## 2019-05-02 DIAGNOSIS — I4821 Permanent atrial fibrillation: Secondary | ICD-10-CM | POA: Diagnosis not present

## 2019-05-02 DIAGNOSIS — J9621 Acute and chronic respiratory failure with hypoxia: Secondary | ICD-10-CM | POA: Diagnosis not present

## 2019-05-02 DIAGNOSIS — E119 Type 2 diabetes mellitus without complications: Secondary | ICD-10-CM | POA: Diagnosis not present

## 2019-05-02 DIAGNOSIS — I5043 Acute on chronic combined systolic (congestive) and diastolic (congestive) heart failure: Secondary | ICD-10-CM | POA: Diagnosis not present

## 2019-05-02 LAB — BASIC METABOLIC PANEL
Anion gap: 9 (ref 5–15)
BUN: 23 mg/dL (ref 8–23)
CO2: 28 mmol/L (ref 22–32)
Calcium: 8.6 mg/dL — ABNORMAL LOW (ref 8.9–10.3)
Chloride: 101 mmol/L (ref 98–111)
Creatinine, Ser: 1.02 mg/dL (ref 0.61–1.24)
GFR calc Af Amer: >60 mL/min (ref >60)
GFR calc non Af Amer: >60 mL/min (ref >60)
Glucose, Bld: 112 mg/dL — ABNORMAL HIGH (ref 70–99)
Potassium: 4 mmol/L (ref 3.5–5.1)
Sodium: 138 mmol/L (ref 135–145)

## 2019-05-02 LAB — GLUCOSE, CAPILLARY
Glucose-Capillary: 118 mg/dL — ABNORMAL HIGH (ref 70–99)
Glucose-Capillary: 147 mg/dL — ABNORMAL HIGH (ref 70–99)

## 2019-05-02 LAB — MAGNESIUM: Magnesium: 2.4 mg/dL (ref 1.7–2.4)

## 2019-05-02 MED ORDER — FUROSEMIDE 40 MG PO TABS: 40.0000 mg | ORAL_TABLET | Freq: Two times a day (BID) | ORAL | 0 refills | Status: DC

## 2019-05-02 NOTE — Telephone Encounter (Signed)
[name] sent to [name], [name]; [name], [name], [name]        Patient has been with [facility] for several years now for o2, it was to recertify him thru his insurance. If he is switching to another dme company once they deliver their equipment he will need to call us to have ours picked up.   Previous Messages  ----- Message -----  From: Vella Kohler D  Sent: 05/01/2019  3:24 PM EDT  To: Teryl Lucy, *  Subject: O2                        DOB: 2045-10-29   Order placed by Wyn Quaker on 10/1 & sent to you on 10/1 for O2. Pt has now told us that you are out of network with his insurance plan & would like to go with a different DME. Please disregard prior order sent to you.   Thank you,  [facility]

## 2019-05-02 NOTE — Progress Notes (Addendum)
Progress Note  Patient Name: [name] Date of Encounter: 05/02/2019  Primary Cardiologist: Candee Furbish, MD   Subjective   Pt states he is breathing fine and wants to discharge home. He refused palliative care yesterday.  Inpatient Medications    Scheduled Meds: . aspirin EC  81 mg Oral Daily  . atorvastatin  10 mg Oral Daily  . carvedilol  3.125 mg Oral BID WC  . enoxaparin (LOVENOX) injection  40 mg Subcutaneous Q24H  . furosemide  40 mg Oral BID  . insulin aspart  0-9 Units Subcutaneous TID WC  . insulin glargine  12 Units Subcutaneous Daily  . lidocaine  1 patch Transdermal Q24H  . mouth rinse  15 mL Mouth Rinse BID  . mometasone-formoterol  2 puff Inhalation BID  . potassium chloride  20 mEq Oral Daily  . sodium chloride flush  3 mL Intravenous Q12H   Continuous Infusions: . sodium chloride Stopped (04/28/19 1048)   PRN Meds: sodium chloride, acetaminophen, albuterol, nitroGLYCERIN, ondansetron (ZOFRAN) IV, sodium chloride flush   Vital Signs    Vitals:   05/01/19 2058 05/02/19 0536 05/02/19 0609 05/02/19 0719  BP: 106/62 105/68  (!) 103/59  Pulse: 73 71  78  Resp: 16 14  16   Temp: 97.6 F (36.4 C) 98.2 F (36.8 C)    TempSrc: Oral Oral    SpO2: 93% 97%  94%  Weight:   68.6 kg   Height:        Intake/Output Summary (Last 24 hours) at 05/02/2019 0857 Last data filed at 05/02/2019 0845 Gross per 24 hour  Intake 1422 ml  Output 2800 ml  Net -1378 ml   Last 3 Weights 05/02/2019 05/01/2019 04/30/2019  Weight (lbs) 151 lb 4.8 oz 153 lb 159 lb 9.8 oz  Weight (kg) 68.629 kg 69.4 kg 72.4 kg      Telemetry    V-paced rhythm - Personally Reviewed  ECG    No new tracings - Personally Reviewed  Physical Exam   GEN: No acute distress.   Neck: + JVD Cardiac: RRR.  Respiratory: respirations unlabored GI: Soft, nontender, non-distended  MS: No edema; No deformity. Neuro:  Nonfocal  Psych: Normal affect   Labs    High Sensitivity Troponin:    Recent Labs  Lab 04/27/19 1502 04/27/19 1930 04/29/19 1105 04/29/19 1405  TROPONINIHS 19* 17 15 16       Chemistry Recent Labs  Lab 04/30/19 0308 05/01/19 0501 05/02/19 0448  NA 138 141 138  K 3.6 4.7 4.0  CL 102 103 101  CO2 27 29 28   GLUCOSE 112* 121* 112*  BUN 24* 20 23  CREATININE 1.05 1.11 1.02  CALCIUM 8.6* 8.9 8.6*  GFRNONAA >60 >60 >60  GFRAA >60 >60 >60  ANIONGAP 9 9 9      Hematology Recent Labs  Lab 04/27/19 1502 04/27/19 2034 04/28/19 0617 04/29/19 0524  WBC 8.4  --  6.8 7.5  RBC 3.98* 4.43 4.02* 4.25  HGB 9.3*  --  8.9* 9.4*  HCT 31.6*  --  31.4* 33.3*  MCV 79.4*  --  78.1* 78.4*  MCH 23.4*  --  22.1* 22.1*  MCHC 29.4*  --  28.3* 28.2*  RDW 18.3*  --  18.2* 18.3*  PLT 233  --  229 219    BNP Recent Labs  Lab 04/27/19 1502  BNP 630.0*     DDimer No results for input(s): DDIMER in the last 168 hours.   Radiology  No results found.  Cardiac Studies   Echocardiogram 04/28/2019: 1. Left ventricular ejection fraction, by visual estimation, is 25 to 30%. The left ventricle has moderate to severely decreased function. Mildly increased left ventricular size. There is mildly increased left ventricular hypertrophy. 2. Multiple segmental abnormalities exist. See findings. 3. Left ventricular diastolic Doppler parameters are indeterminate pattern of LV diastolic filling. 4. Global right ventricle has moderately reduced systolic function.The right ventricular size is normal. No increase in right ventricular wall thickness. 5. Left atrial size was severely dilated. 6. Right atrial size was severely dilated. 7. Mild aortic valve annular calcification. 8. The mitral valve has been repaired/replaced. There is a 33 mm Medtronic Mosiac prosthesis in position. Trace mitral valve regurgitation. Mean gradient of 5 mmHg suggest mildly stenotic flow. 9. The tricuspid valve is grossly normal. Tricuspid valve regurgitation is mild. 10. The aortic  valve is tricuspid Aortic valve regurgitation is trivial by color flow Doppler. 11. The pulmonic valve was grossly normal. Pulmonic valve regurgitation is not visualized by color flow Doppler. 12. The aortic root was not well visualized. 13. Proximal aorta and arch are not well defined. The aortic root appears dilated. CTA would define course better if clinically indicated. 14. Moderately elevated pulmonary artery systolic pressure. 15. A device wire is visualized. 16. The inferior vena cava is dilated in size with <50% respiratory variability, suggesting right atrial pressure of 15 mmHg. 17. The tricuspid regurgitant velocity is 3.19 m/s, and with an assumed right atrial pressure of 15 mmHg, the estimated right ventricular systolic pressure is moderately elevated at 55.7 mmHg.  Patient Profile     73 y.o. male  with a PMH of chronic combined CHF, non-ischemic cardiomyopathy s/pBi-V-ICD, permanent atrial fibrillation s/p AV nodal ablation, mitral valve endocarditis c/b severe MR s/p minimally invasive bioprosthetic MVR 2018,sinus of valsalvaaneurysm, HTN, HLD, DM type 2, COPD on home O2, hx of intracranial hemorrhage, and lung cancer s/p resection, who is beingfollowed by cardiologyfor the evaluation ofatrial flutter, chest pain, and CHF.  Assessment & Plan    Acute on chronic combined heart failure Nonischemic cardiomyopathy - EF 25-30%, previously 35-40% in Jan 2020 - pt switched to PO lasix yesterday - continues to diurese - weight is down another 2 lbs to 151 lbs from 160 lbs on arrival - pt is end-stage CHF - palliative care consulted - home valsartan on hold - sCr 1.02 - K 4.0 - continue 40 mg PO lasix BID   Chest pain Nonobstructive CAD - troponin trend mild and flat, inconsistent with ACS - cardiac CT reviewed, nonobstructive CAD  Permanent atrial flutter BiV ICD in place - pt is s/p AV node ablation - no anticoagulation for prior cerebral hemorrhage    Hyperlipidemia 04/12/2019: Cholesterol 85; HDL 45.30; LDL Cholesterol 20; Triglycerides 95.0; VLDL 19.0 - continue lipitor  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 Wants to discharge home. Dispo may be complicated by finding a new DME company for his O2.    For questions or updates, please contact [place] Please consult [email or web address] for contact info under     Signed, Ledora Bottcher, PA  05/02/2019, 8:57 AM    The patient was seen, examined and discussed with Minette Brine , PA-C and I agree with the above.   He is not interested in palliative care and wants to go home, he appears euvolemic,  I would recommend Lasix 40 mg twice daily at home, weight down 9 lbs since admission, now 151 lbs. He is in end-stage CHF and not a candidate for further advanced therapies. Atrial flutter is probably contributing to this however he is post AV nodal ablation and he cannot be anticoagulated given the fact that he had a prior cerebral hemorrhage. We restarted low-dose carvedilol that he is tolerating well. We will arrange for a follow up.  Ena Dawley, MD 05/02/2019

## 2019-05-02 NOTE — Discharge Summary (Signed)
Physician Discharge Summary  [name] IFO:[id number] DOB: 04-06-1946 DOA: 04/27/2019  PCP: [name], NP  Admit date: 04/27/2019 Discharge date: 05/02/2019  Admitted From: Home Disposition:  Home  Recommendations for Outpatient Follow-up:  1. Follow up with PCP in 1-2 weeks 2. Please obtain BMP/CBC in one week 3. Please follow up with cardiology   Home Health: No Equipment/Devices: n/a, already has home O2  Discharge Condition: guarded CODE STATUS: DNR Diet recommendation: Heart Healthy, fluid restriction   Brief/Interim Summary:  [name] a 73 y.o.malewith medical history significant forchronic combined systolic and diastolic CHF, permanent atrial fibrillation s/p AV nodal ablation and AICD, s/p mitral valve replacement with bioprosthetic valve, COPD on 2 L supplemental O2 via [place], history of CVA, history of CNS bleed, hypertension, hyperlipidemia, type 2 diabetes, history of lung cancer treated with wedge resection who presentedto the ED for evaluation ofdyspnea. In the EDO2 saturation dropped to 84% on 3 L O2 while standing. Chest x-ray showed pulmonary edema bilaterally.  He was admitted to a telemetry bed, and cardiology consulted.  Diuresis was initiated but was initially thwarted by hypotension, and elevated creatinine.  In total, net fluid balance was 6.119 L over the course of admission.  Renal function returned to normal.  Blood pressure did remain soft, so home ARB was held and discontinued on discharge.  Pt is felt to be at end stage CHF. Palliative care was consulted and met with patient and daughter on 10/21.  Patient wishes to remain a DNR, but otherwise wants to proceed with all therapies to treat his heart failure.  He is clinically improved, on his baseline oxygen requirements, and hemodynamically stable for discharge home today on oral Lasix 40 mg BID and cardiology follow up.    Discharge Diagnoses: Principal Problem:   Acute on chronic  combined systolic (congestive) and diastolic (congestive) heart failure (HCC) Active Problems:   COPD GOLD II   Hypertension   Dyslipidemia   Permanent atrial fibrillation   ICD (implantable cardioverter-defibrillator), biventricular, in situ   S/P MVR (mitral valve replacement)   H/O atrioventricular nodal ablation   H/O: CVA (cerebrovascular accident)   Acute on chronic respiratory failure with hypoxia (HCC)   Diabetes ([place])   Acute on chronic combined systolic and diastolic CHF (congestive heart failure) [facility])    Discharge Instructions   Discharge Instructions    (HEART FAILURE PATIENTS) Call MD:  Anytime you have any of the following symptoms: 1) 3 pound weight gain in 24 hours or 5 pounds in 1 week 2) shortness of breath, with or without a dry hacking cough 3) swelling in the hands, feet or stomach 4) if you have to sleep on extra pillows at night in order to breathe.   Complete by: As directed    Avoid straining   Complete by: As directed    Diet - low sodium heart healthy   Complete by: As directed    Restrict fluid intake to less that 1.5 L per day.   Diet Carb Modified   Complete by: As directed    Discharge instructions   Complete by: As directed    Follow-up with Cardiologist in 2 weeks.   Heart Failure patients record your daily weight using the same scale at the same time of day   Complete by: As directed    Increase activity slowly   Complete by: As directed    STOP any activity that causes chest pain, shortness of breath, dizziness, sweating, or exessive weakness  Complete by: As directed      Allergies as of 05/02/2019      Reactions   Anticoagulant Compound Other (See Comments)   Pt had intracranial bleed, therefore all anticoagulation is contraindicated per Dr. Ron Parker   Other Other (See Comments)   Per Dr. Halford Chessman (Surgeon): stated that the patient cannot be put under for any surgery, as he has an enlarged aorta. He would stand only a 50/50 chance  of surviving. He has lung issues, diminished lung tissue, COPD, and emphysema.   Warfarin Sodium Other (See Comments)   Pt had intracranial bleed, therefore all anticoagulation is contraindicated per Dr. Ron Parker      Medication List    STOP taking these medications   amoxicillin 500 MG capsule Commonly known as: AMOXIL   valsartan 40 MG tablet Commonly known as: DIOVAN     TAKE these medications   acetaminophen 325 MG tablet Commonly known as: TYLENOL Take 325-650 mg by mouth every 8 (eight) hours as needed for headache.   aspirin EC 81 MG tablet Take 1 tablet (81 mg total) by mouth daily.   atorvastatin 10 MG tablet Commonly known as: LIPITOR TAKE 1 TABLET BY MOUTH EVERY DAY   budesonide-formoterol 160-4.5 MCG/ACT inhaler Commonly known as: Symbicort INHALE 2 PUFFS INTO THE LUNGS TWICE A DAY What changed:   how much to take  how to take this  when to take this  additional instructions   carvedilol 3.125 MG tablet Commonly known as: COREG TAKE 1 TABLET BY MOUTH TWICE A DAY WITH MEALS   furosemide 40 MG tablet Commonly known as: LASIX Take 1 tablet (40 mg total) by mouth 2 (two) times daily. What changed: when to take this   metFORMIN 1000 MG tablet Commonly known as: GLUCOPHAGE TAKE 1 TABLET BY MOUTH TWICE A DAY WITH MEALS   nitroGLYCERIN 0.4 MG SL tablet Commonly known as: NITROSTAT Place 0.4 mg under the tongue every 5 (five) minutes x 3 doses as needed for chest pain.   OXYGEN Inhale 2-3 L/min into the lungs continuous.   Proventil HFA 108 (90 Base) MCG/ACT inhaler Generic drug: albuterol Inhale 2 puffs into the lungs every 6 (six) hours as needed for wheezing or shortness of breath.            Durable Medical Equipment  (From admission, onward)         Start     Ordered   05/02/19 1027  For home use only DME oxygen  Once    Question Answer Comment  Length of Need Lifetime   Mode or (Route) Nasal cannula   Liters per Minute 3   Frequency  Continuous (stationary and portable oxygen unit needed)   Oxygen conserving device No   Oxygen delivery system Gas      05/02/19 1027          Allergies  Allergen Reactions  . Anticoagulant Compound Other (See Comments)    Pt had intracranial bleed, therefore all anticoagulation is contraindicated per Dr. Ron Parker  . Other Other (See Comments)    Per Dr. Halford Chessman (Surgeon): stated that the patient cannot be put under for any surgery, as he has an enlarged aorta. He would stand only a 50/50 chance of surviving. He has lung issues, diminished lung tissue, COPD, and emphysema.  . Warfarin Sodium Other (See Comments)    Pt had intracranial bleed, therefore all anticoagulation is contraindicated per Dr. Ron Parker    Consultations:  Cardiology   Procedures/Studies:  Dg Chest Portable 1 View  Result Date: 04/27/2019 CLINICAL DATA:  73 year old male with history of chest pain. Dyspnea. EXAM: PORTABLE CHEST 1 VIEW COMPARISON:  Chest x-ray 08/17/2018. FINDINGS: Chronic elevation of the left hemidiaphragm. There is cephalization of the pulmonary vasculature, indistinctness of the interstitial markings, and patchy airspace disease throughout the lungs bilaterally suggestive of moderate pulmonary edema. No definite pleural effusions. Mild cardiomegaly. Upper mediastinal contours are within normal limits. Right-sided biventricular pacemaker/AICD with lead tips projecting over the right ventricle and lateral wall the left ventricle via the coronary sinus and coronary veins. IMPRESSION: 1. The appearance the chest suggests congestive heart failure, as above. Electronically Signed   By: Vinnie Langton M.D.   On: 04/27/2019 15:27      Echo 10/18: EF 25-30%   Subjective: patient seen and examined sitting up in bed.  Reports feeling better today.  States breathing improved.  Denies chest pain, fever, chills, N/V/D.     Discharge Exam: Vitals:   05/02/19 0719 05/02/19 0920  BP: (!) 103/59 104/61   Pulse: 78 72  Resp: 16 16  Temp:  97.8 F (36.6 C)  SpO2: 94% 99%   Vitals:   05/02/19 0536 05/02/19 0609 05/02/19 0719 05/02/19 0920  BP: 105/68  (!) 103/59 104/61  Pulse: 71  78 72  Resp: _0 Temp: 98.2 F (36.8 C)   97.8 F (36.6 C)  TempSrc: Oral   Oral  SpO2: 97%  94% 99%  Weight:  68.6 kg    Height:        General: Pt is alert, awake, not in acute distress Cardiovascular: RRR, S1/S2 +, no rubs, no gallops Respiratory: CTA bilaterally, no rales, no wheezing, no rhonchi, diminished at bases. Normal respiratory effort.  On 3 L/min O2 [place]. Abdominal: Soft, NT, ND, bowel sounds + Extremities: no edema, no cyanosis    The results of significant diagnostics from this hospitalization (including imaging, microbiology, ancillary and laboratory) are listed below for reference.     Microbiology: Recent Results (from the past 240 hour(s))  SARS CORONAVIRUS 2 (TAT 6-24 HRS) Nasopharyngeal Nasopharyngeal Swab     Status: None   Collection Time: 04/27/19  3:02 PM   Specimen: Nasopharyngeal Swab  Result Value Ref Range Status   SARS Coronavirus 2 NEGATIVE NEGATIVE Final    Comment: (NOTE) SARS-CoV-2 target nucleic acids are NOT DETECTED. The SARS-CoV-2 RNA is generally detectable in upper and lower respiratory specimens during the acute phase of infection. Negative results do not preclude SARS-CoV-2 infection, do not rule out co-infections with other pathogens, and should not be used as the sole basis for treatment or other patient management decisions. Negative results must be combined with clinical observations, patient history, and epidemiological information. The expected result is Negative. Fact Sheet for Patients: SugarRoll.be Fact Sheet for Healthcare Providers: https://www.woods-mathews.com/ This test is not yet approved or cleared by the [place] FDA and  has been authorized for detection and/or diagnosis of  SARS-CoV-2 by FDA under an Emergency Use Authorization (EUA). This EUA will remain  in effect (meaning this test can be used) for the duration of the COVID-19 declaration under Section 56 4(b)(1) of the Act, 21 U.S.C. section 360bbb-3(b)(1), unless the authorization is terminated or revoked sooner. Performed at [facility], [place] [address], [place], [place] [address]      Labs: BNP (last 3 results) Recent Labs    07/30/18 1330 04/27/19 1502  BNP 437.2* 604.5*   Basic Metabolic  Panel: Recent Labs  Lab 04/28/19 0617 04/29/19 0524 04/30/19 0308 05/01/19 0501 05/02/19 0448  NA 139 139 138 141 138  K 3.8 4.4 3.6 4.7 4.0  CL 102 102 102 103 101  CO2 _0 GLUCOSE 208* 113* 112* 121* 112*  BUN 28* 27* 24* 20 23  CREATININE 1.08 1.14 1.05 1.11 1.02  CALCIUM 8.8* 8.6* 8.6* 8.9 8.6*  MG 1.9  --   --   --  2.4   Liver Function Tests: No results for input(s): AST, ALT, ALKPHOS, BILITOT, PROT, ALBUMIN in the last 168 hours. No results for input(s): LIPASE, AMYLASE in the last 168 hours. No results for input(s): AMMONIA in the last 168 hours. CBC: Recent Labs  Lab 04/27/19 1502 04/28/19 0617 04/29/19 0524  WBC 8.4 6.8 7.5  NEUTROABS 6.8  --   --   HGB 9.3* 8.9* 9.4*  HCT 31.6* 31.4* 33.3*  MCV 79.4* 78.1* 78.4*  PLT 233 229 219   Cardiac Enzymes: No results for input(s): CKTOTAL, CKMB, CKMBINDEX, TROPONINI in the last 168 hours. BNP: Invalid input(s): POCBNP CBG: Recent Labs  Lab 05/01/19 0534 05/01/19 1108 05/01/19 1548 05/01/19 2148 05/02/19 0641  GLUCAP 121* 146* 132* 177* 118*   D-Dimer No results for input(s): DDIMER in the last 72 hours. Hgb A1c No results for input(s): HGBA1C in the last 72 hours. Lipid Profile No results for input(s): CHOL, HDL, LDLCALC, TRIG, CHOLHDL, LDLDIRECT in the last 72 hours. Thyroid function studies No results for input(s): TSH, T4TOTAL, T3FREE, THYROIDAB in the last 72 hours.  Invalid input(s):  FREET3 Anemia work up No results for input(s): VITAMINB12, FOLATE, FERRITIN, TIBC, IRON, RETICCTPCT in the last 72 hours. Urinalysis    Component Value Date/Time   COLORURINE YELLOW 09/24/2015 1702   APPEARANCEUR CLEAR 09/24/2015 1702   LABSPEC 1.010 09/24/2015 1702   PHURINE 6.0 09/24/2015 1702   GLUCOSEU 100 (A) 09/24/2015 1702   HGBUR NEGATIVE 09/24/2015 1702   HGBUR negative 04/05/2010 0837   BILIRUBINUR NEGATIVE 09/24/2015 1702   BILIRUBINUR n 03/27/2012 1323   KETONESUR NEGATIVE 09/24/2015 1702   PROTEINUR NEGATIVE 09/24/2015 1702   UROBILINOGEN 1.0 11/28/2013 1000   NITRITE NEGATIVE 09/24/2015 1702   LEUKOCYTESUR NEGATIVE 09/24/2015 1702   Sepsis Labs Invalid input(s): PROCALCITONIN,  WBC,  LACTICIDVEN Microbiology Recent Results (from the past 240 hour(s))  SARS CORONAVIRUS 2 (TAT 6-24 HRS) Nasopharyngeal Nasopharyngeal Swab     Status: None   Collection Time: 04/27/19  3:02 PM   Specimen: Nasopharyngeal Swab  Result Value Ref Range Status   SARS Coronavirus 2 NEGATIVE NEGATIVE Final    Comment: (NOTE) SARS-CoV-2 target nucleic acids are NOT DETECTED. The SARS-CoV-2 RNA is generally detectable in upper and lower respiratory specimens during the acute phase of infection. Negative results do not preclude SARS-CoV-2 infection, do not rule out co-infections with other pathogens, and should not be used as the sole basis for treatment or other patient management decisions. Negative results must be combined with clinical observations, patient history, and epidemiological information. The expected result is Negative. Fact Sheet for Patients: SugarRoll.be Fact Sheet for Healthcare Providers: https://www.woods-mathews.com/ This test is not yet approved or cleared by the [place] FDA and  has been authorized for detection and/or diagnosis of SARS-CoV-2 by FDA under an Emergency Use Authorization (EUA). This EUA will remain  in  effect (meaning this test can be used) for the duration of the COVID-19 declaration under Section 56 4(b)(1) of the Act, 21 U.S.C.  section 360bbb-3(b)(1), unless the authorization is terminated or revoked sooner. Performed at [facility], [place] [address], [place], [place] [address]      Time coordinating discharge: Over 30 minutes  SIGNED:   Ezekiel Slocumb, DO [facility] 05/02/2019, 10:34 AM Pager [phone]  If 7PM-7AM, please contact night-coverage [email or web address] Password [email or web address]

## 2019-05-02 NOTE — Telephone Encounter (Signed)
ATC pt, line rang busy multiple times. WCB.

## 2019-05-02 NOTE — Progress Notes (Signed)
SATURATION QUALIFICATIONS: (This note is used to comply with regulatory documentation for home oxygen)  Patient Saturations on Room Air at Rest =85%  Patient Saturations on Room Air while Ambulating =81%  Patient Saturations on3 Liters of oxygen while Ambulating =97%  Please briefly explain why patient needs home oxygen:

## 2019-05-02 NOTE — Progress Notes (Signed)
PT Cancellation Note  Patient Details Name: [name] MRN: [id number] DOB: 1946/01/17   Cancelled Treatment:    Reason Eval/Treat Not Completed: Patient declined, no reason specified. PT attempted to see pt for treatment however pt declining, reporting that he has been on multiple walks in the hallway today and feels comfortable with his mobility. Pt preparing for discharge this afternoon. PT will continue to follow pt until discharge is complete.   Zenaida Niece 05/02/2019, 2:28 PM

## 2019-05-02 NOTE — Progress Notes (Signed)
Patient alert and oriented, VSS, pt. Denies pain, iv and tele d/c. D/c instruction expaalin and  given to the patient. All questions answered. Patient d/c home per order.

## 2019-05-02 NOTE — TOC Progression Note (Addendum)
Transition of Care [facility]) - Progression Note    Patient Details  Name: KILO ESHELMAN MRN: [id number] Date of Birth: 1945/10/10  Transition of Care [facility]) CM/SW Contact  Zenon Mayo, RN Phone Number: 05/02/2019, 2:47 PM  Clinical Narrative:    Patient already has home oxygen with [employer], wife states she was told [employer] is not in net work with their insurance. NCM contacted [employer] and rep states they are in network.  NCM contacted Zack with [employer] he states they are in network and that patient act is on freeze, she will need to call and update the account.  NCM contacted wife and informed her of this information.  She states she has the concentrator and the tanks at the home so patient can come home and use the oxygen.  NCM informed her that will have Zack the [employer] rep give her a call.    10/22 Tomi Bamberger RN, BSN- NCM spoke with Crystal at [facility], had daughter on line also, NCM faxed in sats and new order.  Crystal states that patient should not worry about the bill for 1500.00  And that the act was under manager review and everything was taken care of and patient is good to go.   Expected Discharge Plan: Home/Self Care Barriers to Discharge: Continued Medical Work up  Expected Discharge Plan and Services Expected Discharge Plan: Home/Self Care       Living arrangements for the past 2 months: Single Family Home Expected Discharge Date: 05/02/19                                     Social Determinants of Health (SDOH) Interventions    Readmission Risk Interventions No flowsheet data found.

## 2019-05-02 NOTE — Care Management Important Message (Signed)
Important Message  Patient Details  Name: [name] MRN: [id number] Date of Birth: [birth year]   Medicare Important Message Given:  Yes     Shelda Altes 05/02/2019, 1:26 PM

## 2019-05-03 NOTE — Telephone Encounter (Signed)
ATC Patient. Phone rang several times, no answer, no VM/answering machine.

## 2019-05-03 NOTE — Telephone Encounter (Signed)
Vella Kohler D sent to Mariann Barter        I spoke to [place] about this one yesterday. [employer] called me yesterday & stated that Medicare is the primary insurance & that this needs to be billed to Medicare first & I believe that y'all are in network with Medicare. Melissa advised she would be checking in on this & will provide an update.

## 2019-05-07 NOTE — Telephone Encounter (Signed)
Spoke to [place].  Pt IS IN network w/ them.  Pt just needs to be re-certified for medicare for his O2 use.  States that pt & his spouse has spoken to reps from [employer] who has advised pt of this.  [employer] can be reached at [phone] or [phone].  Per Lenna Sciara, it looks like they have been having trouble with patient answering their calls.    Nothing further needed at this time.

## 2019-05-09 ENCOUNTER — Inpatient Hospital Stay: Payer: Medicare Other | Admitting: Adult Health

## 2019-05-10 ENCOUNTER — Ambulatory Visit: Payer: Medicare Other | Admitting: Pulmonary Disease

## 2019-05-10 ENCOUNTER — Other Ambulatory Visit: Payer: Self-pay

## 2019-05-10 ENCOUNTER — Ambulatory Visit (INDEPENDENT_AMBULATORY_CARE_PROVIDER_SITE_OTHER): Payer: Medicare Other | Admitting: Internal Medicine

## 2019-05-10 ENCOUNTER — Encounter: Payer: Self-pay | Admitting: Internal Medicine

## 2019-05-10 VITALS — BP 96/46 | HR 70 | Ht 71.0 in | Wt 153.0 lb

## 2019-05-10 DIAGNOSIS — I5042 Chronic combined systolic (congestive) and diastolic (congestive) heart failure: Secondary | ICD-10-CM | POA: Diagnosis not present

## 2019-05-10 DIAGNOSIS — Z95810 Presence of automatic (implantable) cardiac defibrillator: Secondary | ICD-10-CM | POA: Diagnosis not present

## 2019-05-10 DIAGNOSIS — I4821 Permanent atrial fibrillation: Secondary | ICD-10-CM

## 2019-05-10 NOTE — Progress Notes (Signed)
HPI Mr. Horton returns today for followup. He is a pleasant 73 yo man with a h/o chronic systolic heart failure, s/p ICD insertion, CHB after AV node ablation, chronic atrial fib and COPD on home oxygen. He is s/p MV replacement.  Allergies  Allergen Reactions  . Anticoagulant Compound Other (See Comments)    Pt had intracranial bleed, therefore all anticoagulation is contraindicated per Dr. Ron Parker  . Other Other (See Comments)    Per Dr. Halford Chessman (Surgeon): stated that the patient cannot be put under for any surgery, as he has an enlarged aorta. He would stand only a 50/50 chance of surviving. He has lung issues, diminished lung tissue, COPD, and emphysema.  . Warfarin Sodium Other (See Comments)    Pt had intracranial bleed, therefore all anticoagulation is contraindicated per Dr. Ron Parker     Current Outpatient Medications  Medication Sig Dispense Refill  . acetaminophen (TYLENOL) 325 MG tablet Take 325-650 mg by mouth every 8 (eight) hours as needed for headache.    . albuterol (PROVENTIL HFA) 108 (90 Base) MCG/ACT inhaler Inhale 2 puffs into the lungs every 6 (six) hours as needed for wheezing or shortness of breath.    Marland Kitchen aspirin EC 81 MG tablet Take 1 tablet (81 mg total) by mouth daily. 90 tablet 3  . atorvastatin (LIPITOR) 10 MG tablet TAKE 1 TABLET BY MOUTH EVERY DAY (Patient taking differently: Take 10 mg by mouth daily. ) 90 tablet 2  . budesonide-formoterol (SYMBICORT) 160-4.5 MCG/ACT inhaler INHALE 2 PUFFS INTO THE LUNGS TWICE A DAY (Patient taking differently: Inhale 2 puffs into the lungs 2 (two) times daily. ) 10.2 Inhaler 6  . carvedilol (COREG) 3.125 MG tablet TAKE 1 TABLET BY MOUTH TWICE A DAY WITH MEALS (Patient taking differently: Take 3.125 mg by mouth 2 (two) times daily with a meal. ) 180 tablet 0  . furosemide (LASIX) 40 MG tablet Take 1 tablet (40 mg total) by mouth 2 (two) times daily. 60 tablet 0  . metFORMIN (GLUCOPHAGE) 1000 MG tablet TAKE 1 TABLET BY MOUTH  TWICE A DAY WITH MEALS (Patient taking differently: Take 1,000 mg by mouth 2 (two) times daily with a meal. ) 180 tablet 0  . nitroGLYCERIN (NITROSTAT) 0.4 MG SL tablet Place 0.4 mg under the tongue every 5 (five) minutes x 3 doses as needed for chest pain.     . OXYGEN Inhale 2-3 L/min into the lungs continuous.      No current facility-administered medications for this visit.      Past Medical History:  Diagnosis Date  . Atrial fibrillation ([place])    AV Node ablation January, 2010, for rapid atrial fib  . Atrial septal defect    Closed with surgery January, 2010  . Automatic implantable cardioverter-defibrillator in situ    LV dysfunction and pacer needed for AV node lesion  . Cardiomyopathy    non-ischemic  . CHF (congestive heart failure) ([place])   . Chronic combined systolic and diastolic CHF (congestive heart failure) ([place])   . Colon polyps   . COPD (chronic obstructive pulmonary disease) (HCC)    O2- 2 liters, nasal cannula, q night   . COPD GOLD II 01/11/2007   PFT's 09/25/13  FEV1  2.18 (61%) ratio 64 no change p B2 and DLC0  32% corrects to 68% - trial off advair and acei rec starting  08/23/2013     . CVA (cerebral vascular accident) [facility]) 2009   denies residual on  08/14/2013  . Diabetes mellitus without complication ([place])    type 2  . Dyslipidemia   . Dysrhythmia   . Ejection fraction < 50%   . Endocarditis    Bacterial, 2009  . Headache(784.0)    related to stroke only  . HLD (hyperlipidemia)   . Hypertension   . Intracranial hemorrhage (HCC)    Coumadin cannot be used because of the history of his bleed  . Lung cancer ([place]) 11/29/2013   T1N0 Stage Ia non-small cell carcinoma left lung treated with wedge resection  . Mitral regurgitation    Severe symptomatic primary MR due to bacterial endocarditis, treated w/ MVR // Echo 1/18 EF 40-45, diffuse HK, dilated aorta at 42 mm/aortic root 47 mm; linear echogenic structure in ascending aorta - suspect reverberation artifact /  consider CT or TEE to rule out dissection flap, bioprosthetic MVR with mean gradient 3, severe LAE, low normal RVSF, severe RAE, moderate TR, PASP 42  . Myocardial infarction ([place]) 2010  . Pacemaker    combo pacer and icd  . Permanent atrial fibrillation    Originally Coumadin use for atrial fibrillation  //   he had intracerebral hemorrhage with an INR of 2.3 June, 2009. Anticoagulation could no longer be used.  //  Rapid atrial fibrillation after inferior MI October, 2010..........Marland Kitchen AV node ablation done at that time with ICD pacemaker placed (EF 35%).   //   Left atrial appendage tied off at the time of mitral valve surgery January, 2010 (maze pro  . Pneumonia 07/2018  . Prosthetic valve dysfunction    Mild mitral stenosis  . Pulmonary hypertension (HCC)    Moderate  . Renal artery stenosis (HCC)    Mild by history  . 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 Spontaneous pneumothorax    right thoracotomy - distant past  . Status post minimally invasive mitral valve replacement with bioprosthetic valve    33 mm Medtronic Mosaic porcine bioprosthesis placed via right mini thoracotomy for bacterial endocarditis complicated by severe MR and CHF   . Thoracic aortic aneurysm ([place]) 08/11/2016   a - Chest CTA 1/18:  Aneurysmal dilatation of aortic root is noted at 5.1 cm.     ROS:   All systems reviewed and negative except as noted in the HPI.   Past Surgical History:  Procedure Laterality Date  . APPENDECTOMY    . ASD REPAIR, SECUNDUM  07/17/2008   pericardial patch closure of ASD  . AV NODE ABLATION  07/2008   for rapid atrial fib  . CARDIAC CATHETERIZATION    . CARDIAC DEFIBRILLATOR PLACEMENT  ~ [address]  . CARDIAC VALVE REPLACEMENT    . CATARACT EXTRACTION W/ INTRAOCULAR LENS  IMPLANT, BILATERAL Bilateral   . ESOPHAGOGASTRODUODENOSCOPY (EGD) WITH PROPOFOL N/A 09/29/2016   Procedure: ESOPHAGOGASTRODUODENOSCOPY (EGD) WITH PROPOFOL;  Surgeon: [name], MD;  Location: [facility]  ENDOSCOPY;  Service: Endoscopy;  Laterality: N/A;  . HERNIA REPAIR    . IMPLANTABLE CARDIOVERTER DEFIBRILLATOR (ICD) GENERATOR CHANGE N/A 02/06/2012   Procedure: ICD GENERATOR CHANGE;  Surgeon: [name], MD;  Location: [facility] CATH LAB;  Service: Cardiovascular;  Laterality: N/A;  . INSERT / REPLACE / REMOVE PACEMAKER    . MASS BIOPSY Left    neck mass  . MITRAL VALVE REPLACEMENT Right 07/17/2008   69mm Medtronic Mosaic porcine bioprosthesis  . PENILE PROSTHESIS IMPLANT    . RIGHT HEART CATHETERIZATION N/A 08/16/2013   Procedure: RIGHT HEART CATH;  Surgeon:  Josue Hector, MD;  Location[facility] C[facility] LAB;  Service: Cardiovascular;  Laterality: N/A;  . TEE WITHOUT CARDIOVERSION N/A 09/06/2016   Procedure: TRANSESOPHAGEAL ECHOCARDIOGRAM (TEE);  Surgeon: Dorothy Spark, MD;  Location: [facility] ENDOSCOPY;  Service: Cardiovascular;  Laterality: N/A;  . THORACOTOMY Right 1970's   spontaneous pneumothorax - while in the [place]  . TOE SURGERY     left foot hammer toe  . TONSILLECTOMY    . VIDEO ASSISTED THORACOSCOPY (VATS)/WEDGE RESECTION Left 11/29/2013   Procedure: Video assisted thoracoscopy for wedge resection; mini thoracotomy;  Surgeon: [name], MD;  Location: [facility] OR;  Service: Thoracic;  Laterality: Left;     Family History  Problem Relation Age of Onset  . Stomach cancer Father   . Stroke Mother      Social History   Socioeconomic History  . Marital status: Married    Spouse name: [name]  . Number of children: 0  . Years of education: College  . Highest education level: Not on file  Occupational History  . 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 Financial resource strain: Not on file  . Food insecurity    Worry: Not on file    Inability: Not on file  . Transportation needs    Medical: Not on file    Non-medical: Not on file  Tobacco Use  . Smoking status: Former Smoker    Packs/day: 1.00    Years: 45.00    Pack years: 45.00     Types: Cigarettes    Quit date: 08/11/2013    Years since quitting: 5.7  . Smokeless tobacco: Never Used  . Tobacco comment: 08/14/2013 "quit smoking in 2009"  Substance and Sexual Activity  . Alcohol use: No    Alcohol/week: 0.0 standard drinks    Comment: 08/14/2013 "used to drink beer; quit:in 1982"  . Drug use: No  . Sexual activity: Yes  Lifestyle  . Physical activity    Days per week: Not on file    Minutes per session: Not on file  . Stress: Not on file  Relationships  . Social Herbalist on phone: Not on file    Gets together: Not on file    Attends religious service: Not on file    Active member of club or organization: Not on file    Attends meetings of clubs or organizations: Not on file    Relationship status: Not on file  . Intimate partner violence    Fear of current or ex partner: Not on file    Emotionally abused: Not on file    Physically abused: Not on file    Forced sexual activity: Not on file  Other Topics Concern  . Not on file  Social History Narrative   Patient lives at home with his spouse.   Caffeine Use: none   Worked for the [employer]   Has two boys and a girl. All live local.      BP (!) 96/46   Pulse 70   Ht 5\' 11"  (1.803 m)   Wt 153 lb (69.4 kg)   SpO2 98%   BMI 21.34 kg/m   Physical Exam:  Well appearing NAD HEENT: Unremarkable Neck:  No JVD, no thyromegally Lymphatics:  No adenopathy Back:  No CVA tenderness Lungs:  Clear HEART:  Regular rate rhythm, no murmurs, no rubs, no clicks Abd:  soft, positive bowel sounds, no organomegally,  no rebound, no guarding Ext:  2 plus pulses, no edema, no cyanosis, no clubbing Skin:  No rashes no nodules Neuro:  CN II through XII intact, motor grossly intact  EKG - atrial fib with biv pacing  DEVICE  Normal device function.  See PaceArt for details.   Assess/Plan: 1. Chronic systolic heart failure - his symptoms are well controlled. His fluid index is elevated. He does not  have worsening sob.  2. Chronic atrial fib - his rates are well controlled. We will recheck in several months. 3. ICD - his medtronic biv ICD is working normally. He has about 2 years of battery longevity.  Mikle Bosworth.D.

## 2019-05-10 NOTE — Patient Instructions (Signed)
Medication Instructions:  Your physician recommends that you continue on your current medications as directed. Please refer to the Current Medication list given to you today.  Labwork: None ordered.  Testing/Procedures: None ordered.  Follow-Up: Your physician wants you to follow-up in: one year with Dr. Lovena Le.   You will receive a reminder letter in the mail two months in advance. If you don't receive a letter, please call our office to schedule the follow-up appointment.  Remote monitoring is used to monitor your ICD from home. This monitoring reduces the number of office visits required to check your device to one time per year. It allows [place] to keep an eye on the functioning of your device to ensure it is working properly. You are scheduled for a device check from home on 05/29/2019. You may send your transmission at any time that day. If you have a wireless device, the transmission will be sent automatically. After your physician reviews your transmission, you will receive a postcard with your next transmission date.  Any Other Special Instructions Will Be Listed Below (If Applicable).  If you need a refill on your cardiac medications before your next appointment, please call your pharmacy.

## 2019-05-14 ENCOUNTER — Other Ambulatory Visit: Payer: Self-pay | Admitting: *Deleted

## 2019-05-14 IMAGING — US US ABDOMEN COMPLETE
1 series · 13 of 25 positions shown · non-contrast
Comparison: 08/04/2016

CLINICAL DATA: Hepatic cirrhosis, continued surveillance.

EXAM:
ABDOMEN ULTRASOUND COMPLETE

[Series 1: us abdomen complete · 0.22mm/px · 13 of 83 slices shown]
[im 1/83]
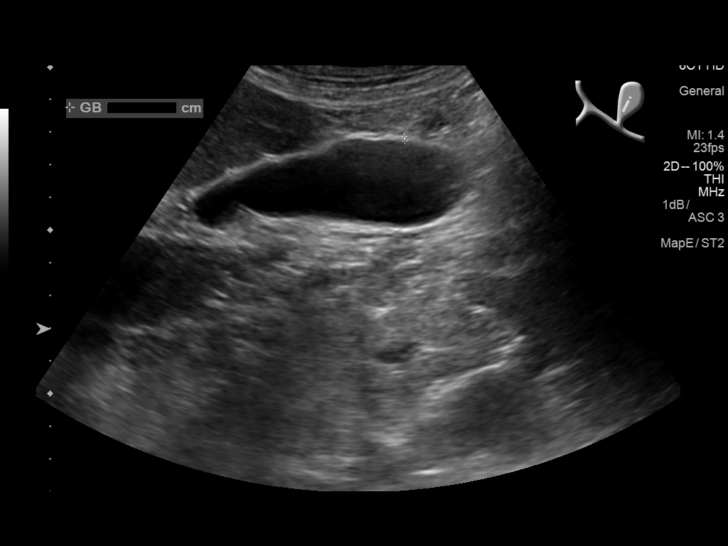
[im 7/83]
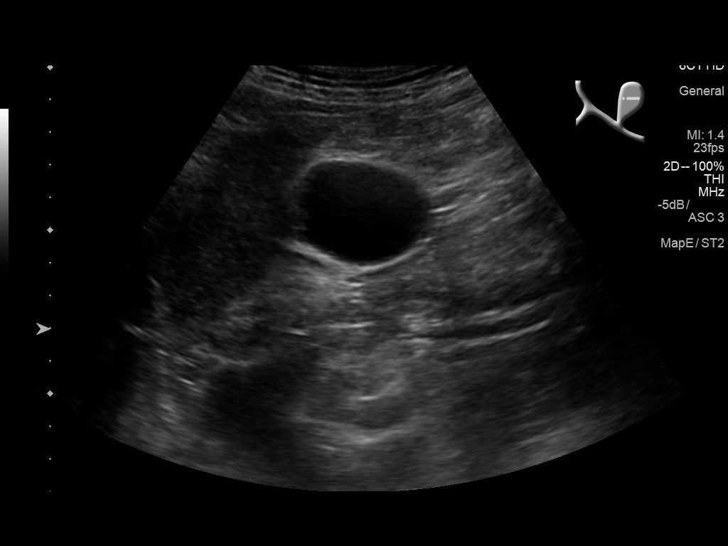
[im 14/83]
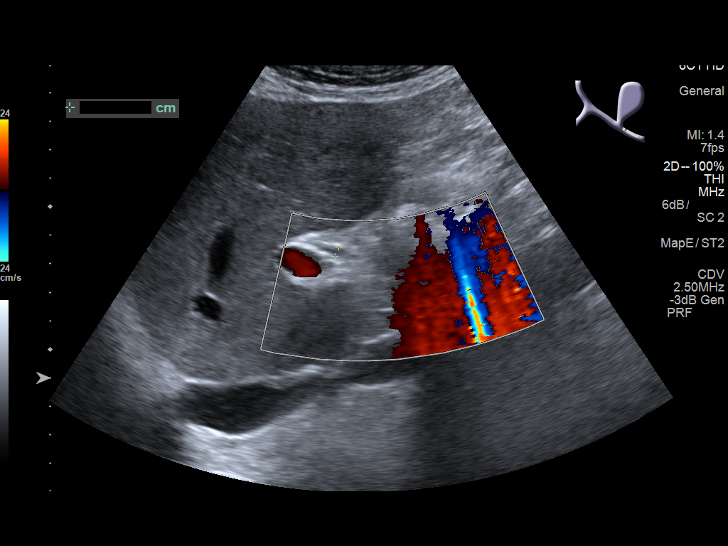
[im 21/83]
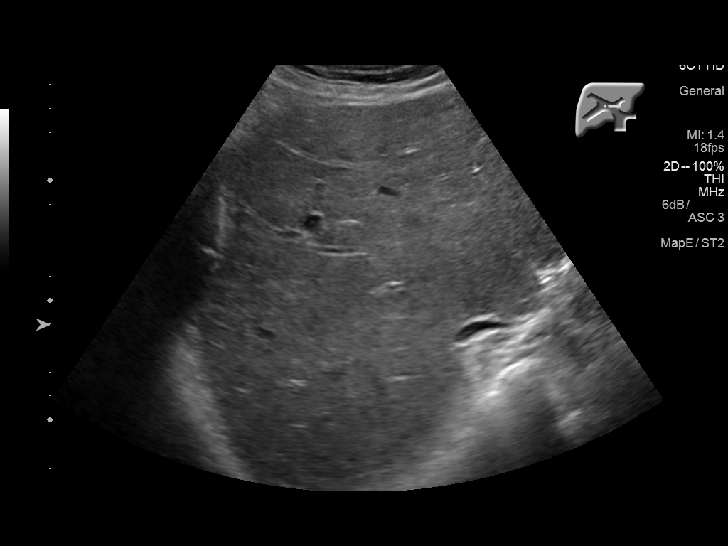
[im 28/83]
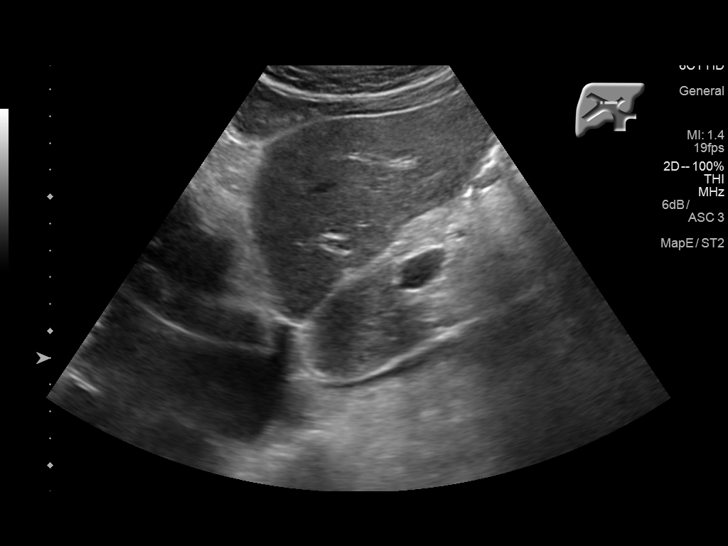
[im 35/83]
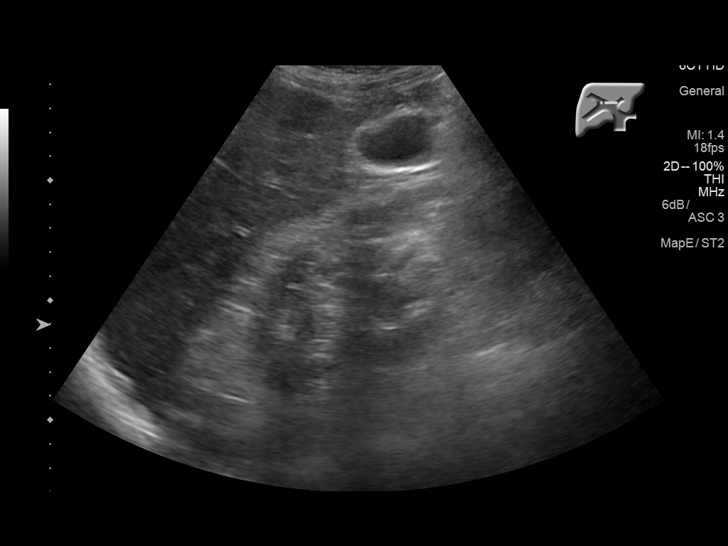
[im 42/83]
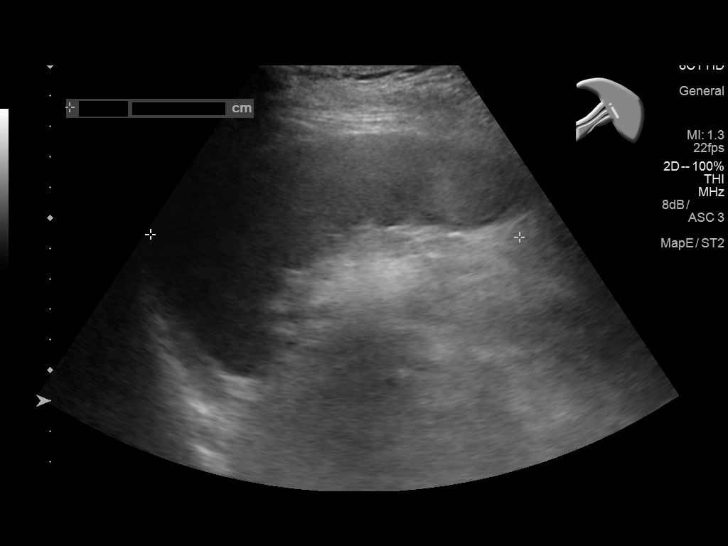
[im 48/83]
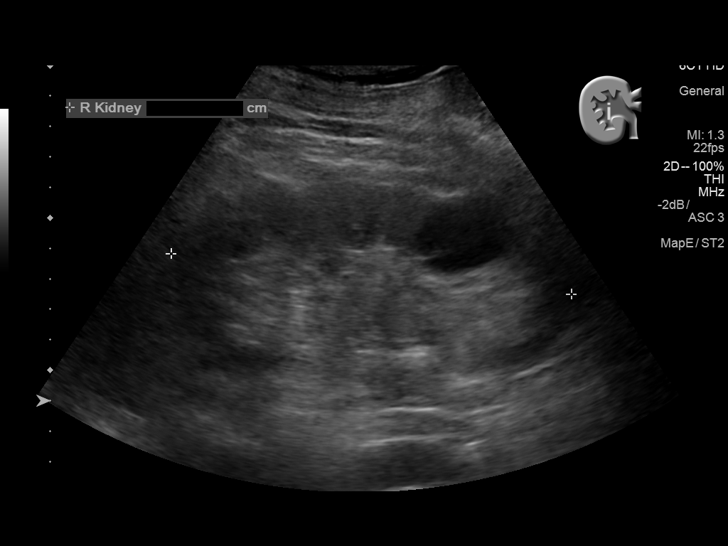
[im 55/83]
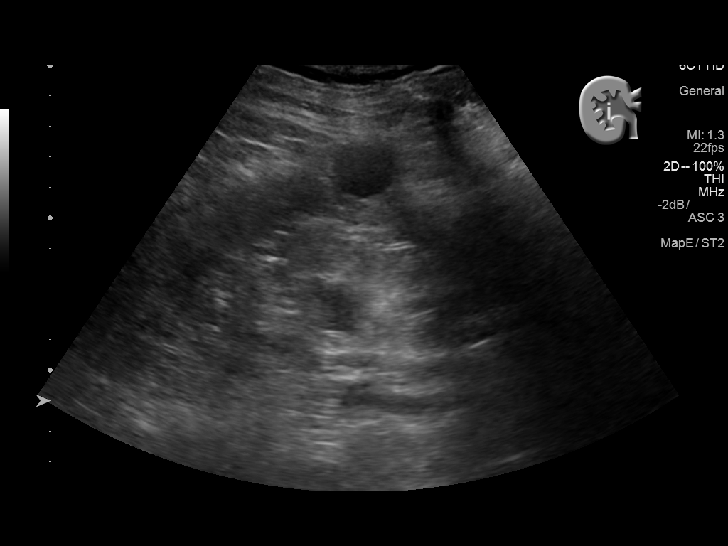
[im 62/83]
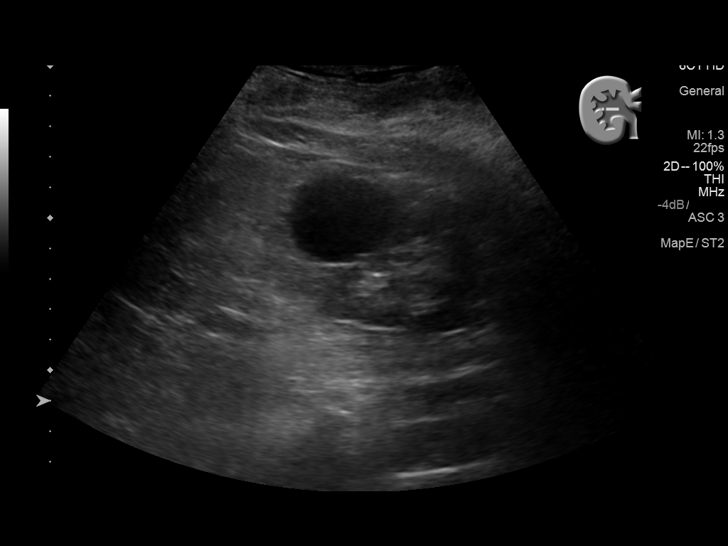
[im 69/83]
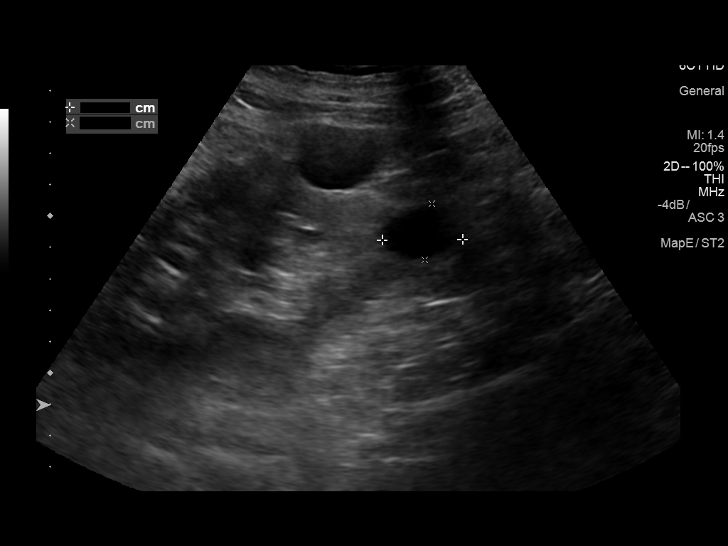
[im 76/83]
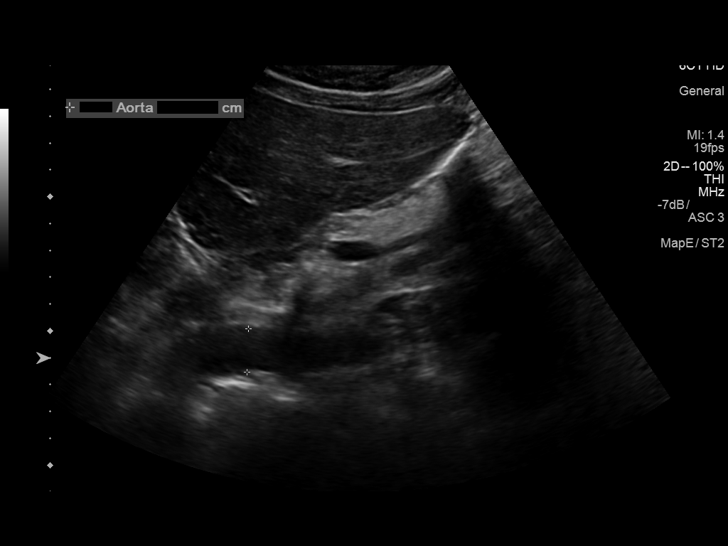
[im 83/83]
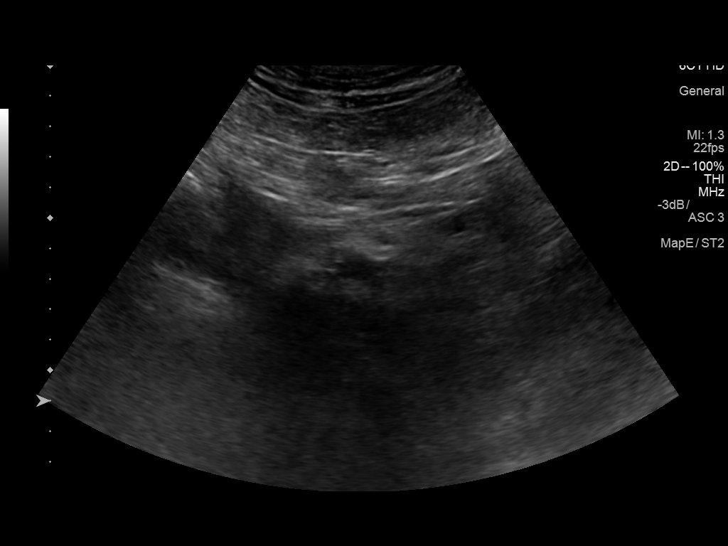

[13 of 25 positions shown; findings below may reference images not displayed]

FINDINGS: Gallbladder: No gallstones or wall thickening visualized. No
sonographic Murphy sign noted by sonographer.

Common bile duct: Diameter: 3.5 mm

Liver: Increased echogenicity. Nodular contour. Portal vein is
patent on color Doppler imaging with normal direction of blood flow
towards the liver.

IVC: No abnormality visualized.

Pancreas: Visualized portion unremarkable.

Spleen: Splenomegaly.  12.1 cm length.

Right Kidney: Length: 13.2 cm. Echogenicity within normal limits. No
mass or hydronephrosis visualized. Simple cysts in the RIGHT kidney
with maximum transverse dimensions of 3.6 and 2.3 cm are identified.

Left Kidney: Length: 14.5. Echogenicity within normal limits. No
mass or hydronephrosis visualized. Simple cysts in the LEFT kidney
with maximum transverse dimensions of 3.1 and 2.6 cm

Abdominal aorta: No aneurysm visualized.

Other findings: No ascites.
IMPRESSION: Stable exam. Increased echogenicity of hepatic parenchyma with
nodular hepatic contour is consistent with cirrhosis. Normal
direction of portal vein blood flow toward the liver.

## 2019-05-14 NOTE — Patient Outreach (Signed)
[place]) Care Management  05/14/2019  [name] [birth year] [id number]    Telephone Assessment  RN attempted outreach call today post hospital follow up however unsuccessful in reaching the pt. Unable to leave a message.   PLAN: Will continue to follow up with this pt for ongoing [facility] services. Will follow up with another outreach call next week.  Raina Mina, RN Care Management Coordinator [place] Office [phone]

## 2019-05-16 NOTE — Progress Notes (Signed)
CARDIOLOGY OFFICE NOTE  Date:  05/21/2019    [name] Date of Birth: 1946/01/18 Medical Record #[id number]  PCP:  Charles Peng, NP  Cardiologist:  [place]    Chief Complaint  Patient presents with  . Hospitalization Follow-up    History of Present Illness: [name] is a 73 y.o. male who presents today for a 10 day check/post hospital visit. Seen for Dr. Marlou [name] & Dr. Lovena [name].   He has a history of MV disease with prior bioprosthetic valve in the mitral position, persistent AF, prior AV nodal ablation (due to inability to rate control), underlying ICD/PPM and prior CNS bleed (not on anticoagulation). Has had prior left atrial appendage occlusion/tied. He has had chronic elevation of his bilirubin. He has developed lung cancer and is followed with Dr. Earlie [name] - on oxygen. Other issues include HTN, HLD, COPD, DM and a very large sinus of valsalva aneurysm.   Last seen by Dr. Marlou [name] in early March - had seen Dr. Roxy [name] back in June and felt to be stable - no plans for any type intervention due to his numerous co-morbidities and high risk status. Has chronic shortness of breath - on oxygen.   Saw Dr. Lovena [name] just 10 days ago. Had been admitted about a month ago with acute on chronic systolic HF - felt to be end stage CHF - seen by Palliative care - noted that he has wished to remain a DNR but otherwise wanted to proceed with all other treatment therapies and declined their services.   The patient does not have symptoms concerning for COVID-19 infection (fever, chills, cough, or new shortness of breath).   Comes in today. Here alone. Says he is doing ok. Tells me he is "great". No chest pain. Wearing his oxygen. Has a more portable unit that allows him to go out - to the point he says he has raked some leaves. Breathing is ok. Weight is stable. Taking Lasix twice a day. BP chronically soft - not really symptomatic. Overall, seems to be holding his own.   Past Medical  History:  Diagnosis Date  . Atrial fibrillation ([place])    AV Node ablation January, 2010, for rapid atrial fib  . Atrial septal defect    Closed with surgery January, 2010  . Automatic implantable cardioverter-defibrillator in situ    LV dysfunction and pacer needed for AV node lesion  . Cardiomyopathy    non-ischemic  . CHF (congestive heart failure) ([place])   . Chronic combined systolic and diastolic CHF (congestive heart failure) ([place])   . Colon polyps   . COPD (chronic obstructive pulmonary disease) (HCC)    O2- 2 liters, nasal cannula, q night   . COPD GOLD II 01/11/2007   PFT's 09/25/13  FEV1  2.18 (61%) ratio 64 no change p B2 and DLC0  32% corrects to 68% - trial off advair and acei rec starting  08/23/2013     . CVA (cerebral vascular accident) ([place]) 2009   denies residual on 08/14/2013  . Diabetes mellitus without complication ([place])    type 2  . Dyslipidemia   . Dysrhythmia   . Ejection fraction < 50%   . Endocarditis    Bacterial, 2009  . Headache(784.0)    related to stroke only  . HLD (hyperlipidemia)   . Hypertension   . Intracranial hemorrhage (HCC)    Coumadin cannot be used because of the history of his bleed  . Lung cancer ([place])  11/29/2013   T1N0 Stage Ia non-small cell carcinoma left lung treated with wedge resection  . Mitral regurgitation    Severe symptomatic primary MR due to bacterial endocarditis, treated w/ MVR // Echo 1/18 EF 40-45, diffuse HK, dilated aorta at 42 mm/aortic root 47 mm; linear echogenic structure in ascending aorta - suspect reverberation artifact / consider CT or TEE to rule out dissection flap, bioprosthetic MVR with mean gradient 3, severe LAE, low normal RVSF, severe RAE, moderate TR, PASP 42  . Myocardial infarction ([place]) 2010  . Pacemaker    combo pacer and icd  . Permanent atrial fibrillation    Originally Coumadin use for atrial fibrillation  //   he had intracerebral hemorrhage with an INR of 2.3 June, 2009. Anticoagulation could no  longer be used.  //  Rapid atrial fibrillation after inferior MI October, 2010..........Marland Kitchen AV node ablation done at that time with ICD pacemaker placed (EF 35%).   //   Left atrial appendage tied off at the time of mitral valve surgery January, 2010 (maze pro  . Pneumonia 07/2018  . Prosthetic valve dysfunction    Mild mitral stenosis  . Pulmonary hypertension (HCC)    Moderate  . Renal artery stenosis (HCC)    Mild by history  . 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 Spontaneous pneumothorax    right thoracotomy - distant past  . Status post minimally invasive mitral valve replacement with bioprosthetic valve    33 mm Medtronic Mosaic porcine bioprosthesis placed via right mini thoracotomy for bacterial endocarditis complicated by severe MR and CHF   . Thoracic aortic aneurysm ([facility]) 08/11/2016   a - Chest CTA 1/18:  Aneurysmal dilatation of aortic root is noted at 5.1 cm.     Past Surgical History:  Procedure Laterality Date  . APPENDECTOMY    . ASD REPAIR, SECUNDUM  07/17/2008   pericardial patch closure of ASD  . AV NODE ABLATION  07/2008   for rapid atrial fib  . CARDIAC CATHETERIZATION    . CARDIAC DEFIBRILLATOR PLACEMENT  ~ [address] [employer]  . CARDIAC VALVE REPLACEMENT    . CATARACT EXTRACTION W/ INTRAOCULAR LENS  IMPLANT, BILATERAL Bilateral   . ESOPHAGOGASTRODUODENOSCOPY (EGD) WITH PROPOFOL N/A 09/29/2016   Procedure: ESOPHAGOGASTRODUODENOSCOPY (EGD) WITH PROPOFOL;  Surgeon: [name], MD;  Location: [facility] ENDOSCOPY;  Service: Endoscopy;  Laterality: N/A;  . HERNIA REPAIR    . IMPLANTABLE CARDIOVERTER DEFIBRILLATOR (ICD) GENERATOR CHANGE N/A 02/06/2012   Procedure: ICD GENERATOR CHANGE;  Surgeon: [name], MD;  Location: [facility] C[facility] LAB;  Service: Cardiovascular;  Laterality: N/A;  . INSERT / REPLACE / REMOVE PACEMAKER    . MASS BIOPSY Left    neck mass  . MITRAL VALVE REPLACEMENT Right 07/17/2008   39mm Medtronic Mosaic porcine bioprosthesis  . PENILE PROSTHESIS IMPLANT     . RIGHT HEART CATHETERIZATION N/A 08/16/2013   Procedure: RIGHT HEART CATH;  Surgeon: Josue Hector, MD;  Location: [facility] CATH LAB;  Service: Cardiovascular;  Laterality: N/A;  . TEE WITHOUT CARDIOVERSION N/A 09/06/2016   Procedure: TRANSESOPHAGEAL ECHOCARDIOGRAM (TEE);  Surgeon: Dorothy Spark, MD;  Location: [facility] ENDOSCOPY;  Service: Cardiovascular;  Laterality: N/A;  . THORACOTOMY Right 1970's   spontaneous pneumothorax - while in the [place]  . TOE SURGERY     left foot hammer toe  . TONSILLECTOMY    . VIDEO ASSISTED THORACOSCOPY (VATS)/WEDGE RESECTION Left 11/29/2013   Procedure: Video assisted thoracoscopy for wedge resection; mini thoracotomy;  Surgeon: [name], MD;  Location: [facility] OR;  Service: Thoracic;  Laterality: Left;     Medications: Current Meds  Medication Sig  . atorvastatin (LIPITOR) 10 MG tablet Take 10 mg by mouth daily.  . carvedilol (COREG) 3.125 MG tablet Take 3.125 mg by mouth 2 (two) times daily with a meal.  . metFORMIN (GLUCOPHAGE) 1000 MG tablet Take 1,000 mg by mouth 2 (two) times daily with a meal.     Allergies: Allergies  Allergen Reactions  . Anticoagulant Compound Other (See Comments)    Pt had intracranial bleed, therefore all anticoagulation is contraindicated per Dr. Ron Parker  . Other Other (See Comments)    Per Dr. Halford Chessman (Surgeon): stated that the patient cannot be put under for any surgery, as he has an enlarged aorta. He would stand only a 50/50 chance of surviving. He has lung issues, diminished lung tissue, COPD, and emphysema.  . Warfarin Sodium Other (See Comments)    Pt had intracranial bleed, therefore all anticoagulation is contraindicated per Dr. Ron Parker    Social History: The patient  reports that he quit smoking about 5 years ago. His smoking use included cigarettes. He has a 45.00 pack-year smoking history. He has never used smokeless tobacco. He reports that he does not drink alcohol or use drugs.   Family History: The  patient's family history includes Stomach cancer in his father; Stroke in his mother.   Review of Systems: Please see the history of present illness.   All other systems are reviewed and negative.   Physical Exam: VS:  BP (!) 104/50 (BP Location: Left Arm, Patient Position: Sitting, Cuff Size: Normal)   Pulse 72   Ht 6\' 1"  (1.854 m)   Wt 155 lb (70.3 kg)   SpO2 100% Comment: on 3 L  BMI 20.45 kg/m  .  BMI Body mass index is 20.45 kg/m.  Wt Readings from Last 3 Encounters:  05/21/19 155 lb (70.3 kg)  05/10/19 153 lb (69.4 kg)  05/02/19 151 lb 4.8 oz (68.6 kg)    General: Pleasant. Looks chronically ill. He seems to have really declined since I last saw him.Looks thinner since I last saw him.    HEENT: Normal. Hard of hearing.  Neck: Supple, no JVD, carotid bruits, or masses noted.  Cardiac: Pretty regular rhythm - presumed paced. Rate is ok. No edema.  Respiratory:  Lungs are clear to auscultation bilaterally with normal work of breathing.  GI: Soft and nontender.  MS: No deformity or atrophy. Gait and ROM intact.  Skin: Warm and dry. Color is normal.  Neuro:  Strength and sensation are intact and no gross focal deficits noted.  Psych: Alert, appropriate and with normal affect.   LABORATORY DATA:  EKG:  EKG is not ordered today.   Lab Results  Component Value Date   WBC 7.5 04/29/2019   HGB 9.4 (L) 04/29/2019   HCT 33.3 (L) 04/29/2019   PLT 219 04/29/2019   GLUCOSE 112 (H) 05/02/2019   CHOL 85 04/12/2019   TRIG 95.0 04/12/2019   HDL 45.30 04/12/2019   LDLDIRECT 80.2 03/31/2011   LDLCALC 20 04/12/2019   ALT 6 04/12/2019   AST 9 04/12/2019   NA 138 05/02/2019   K 4.0 05/02/2019   CL 101 05/02/2019   CREATININE 1.02 05/02/2019   BUN 23 05/02/2019   CO2 28 05/02/2019   TSH 3.37 04/12/2019   PSA 1.85 04/12/2019   INR 1.19 08/27/2018   HGBA1C 7.5 (H) 04/12/2019  MICROALBUR 3.2 (H) 01/22/2015     BNP (last 3 results) Recent Labs    07/30/18 1330 04/27/19  1502  BNP 437.2* 630.0*    ProBNP (last 3 results) No results for input(s): PROBNP in the last 8760 hours.   Other Studies Reviewed Today:   ECHO IMPRESSIONS 04/2019   1. Left ventricular ejection fraction, by visual estimation, is 25 to 30%. The left ventricle has moderate to severely decreased function. Mildly increased left ventricular size. There is mildly increased left ventricular hypertrophy.  2. Multiple segmental abnormalities exist. See findings.  3. Left ventricular diastolic Doppler parameters are indeterminate pattern of LV diastolic filling.  4. Global right ventricle has moderately reduced systolic function.The right ventricular size is normal. No increase in right ventricular wall thickness.  5. Left atrial size was severely dilated.  6. Right atrial size was severely dilated.  7. Mild aortic valve annular calcification.  8. The mitral valve has been repaired/replaced. There is a 33 mm Medtronic Mosiac prosthesis in position. Trace mitral valve regurgitation. Mean gradient of 5 mmHg suggest mildly stenotic flow.  9. The tricuspid valve is grossly normal. Tricuspid valve regurgitation is mild. 10. The aortic valve is tricuspid Aortic valve regurgitation is trivial by color flow Doppler. 11. The pulmonic valve was grossly normal. Pulmonic valve regurgitation is not visualized by color flow Doppler. 12. The aortic root was not well visualized. 13. Proximal aorta and arch are not well defined. The aortic root appears dilated. CTA would define course better if clinically indicated. 14. Moderately elevated pulmonary artery systolic pressure. 15. A device wire is visualized. 16. The inferior vena cava is dilated in size with <50% respiratory variability, suggesting right atrial pressure of 15 mmHg. 17. The tricuspid regurgitant velocity is 3.19 m/s, and with an assumed right atrial pressure of 15 mmHg, the estimated right ventricular systolic pressure is moderately elevated  at 55.7 mmHg.   ASSESSMENT & PLAN:    1. NICM - with chronic systolic HF - recent admission - he is a DNR - felt to be end stage - declined Palliative Care services. Seems to be holding his own but overall a tenuous situation.   2. Persistent AF - not a candidate for anticoagulation due to prior intracranial bleed - he has had clipping of the LA appendage - has had prior AV nodal ablation and has underlying BiV/ICD in place - rate is fine today.   3. Prior MV replacement with bioprosthetic valve due to endocarditis - last echo noted.   4. 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 Roxy [name] - no plans for intervention.   5. COPD - on chronic oxygen use. His cannula was leaking today and we changed it out.   6. Chronic systolic HF - EF of 27% - managed medically - felt to be end-stage - see above.    7. CAD - remote inferior MI 2010 - normal coronaries - no active chest pain noted. Would favor conservative management.   8. History of orthostatic hypotension - BP is soft but he is not symptomatic.   9. Non small cell lung cancer - s/p wedge resection - followed by Oncology - not discussed but adds to his overall tenuous situation.   10. Chronic bilirubin elevation - not discussed.   39. COVID-19 Education: The signs and symptoms of COVID-19 were discussed with the patient and how to seek care for testing (follow up with PCP or arrange  E-visit).  The importance of social distancing, staying at home, hand hygiene and wearing a mask when out in public were discussed today.  Current medicines are reviewed with the patient today.  The patient does not have concerns regarding medicines other than what has been noted above.  The following changes have been made:  See above.  Labs/ tests ordered today include:   No orders of the defined types were placed in this encounter.    Disposition:   FU with [place] in about 3 months.     Patient is agreeable to this plan and will call if any problems develop in the interim.   SignedTruitt Merle, NP  05/21/2019 3:27 PM  [facility] [address] [place],   [address] Phone: [phone] Fax: [phone]

## 2019-05-18 IMAGING — CT CT HEART MORP W/ CTA COR W/ SCORE W/ CA W/CM &/OR W/O CM
4 of 7 series · 8 of 20 positions shown, 9 images · non-contrast
Comparison: Cardiac/Chest CTA from 04/03/2017
COMPARISON: 04/03/2017

CLINICAL DATA: 71-year-old male with known pseudoaneurysm of the
right coronary cusp, 6 months follow up scan.

EXAM:
Cardiac TAVR CT
TECHNIQUE: The patient was scanned on a Phillips Force scanner. A 120 kV
retrospective scan was triggered in the descending thoracic aorta at
111 HU's. Gantry rotation speed was 250 msecs and collimation was .6
mm. No beta blockade or nitro were given. The 3D data set was
reconstructed in 5% intervals of the R-R cycle. Systolic and
diastolic phases were analyzed on a dedicated work station using
MPR, MIP and VRT modes. The patient received 80 cc of contrast.

[Series 6: best diast 67 % · axial · 0.47mm/px · z∈[+1129,+1219]mm · 2 of 673 slices shown, 3 images]
[im 225/673  vessel]
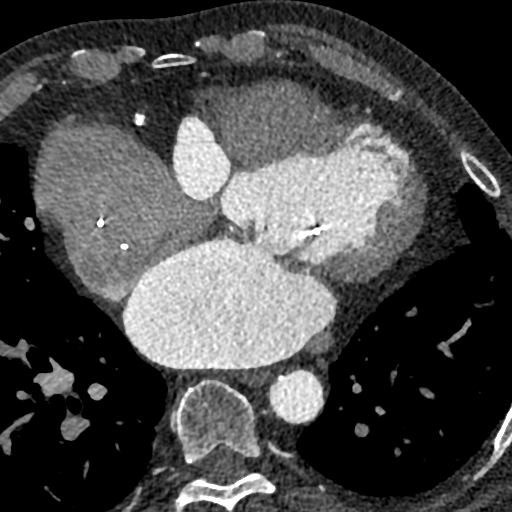
[im 225/673  lung]
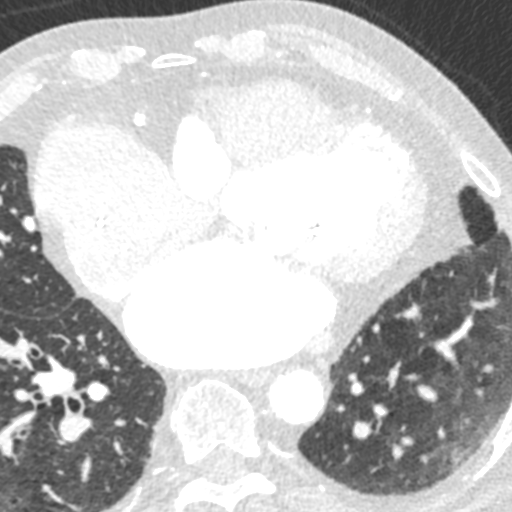
[im 449/673  vessel]
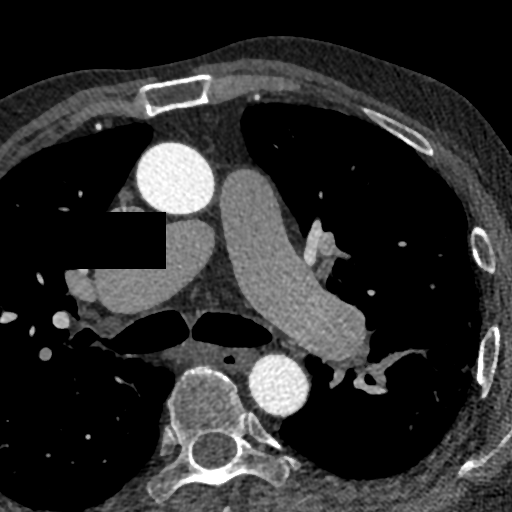

[Series 7: best syst 43 % · axial · 0.47mm/px · z∈[+1129,+1219]mm · 2 of 673 slices shown]
[im 225/673  vessel]
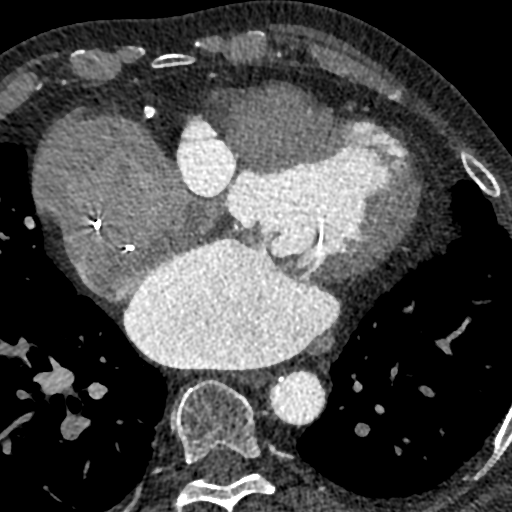
[im 449/673  vessel]
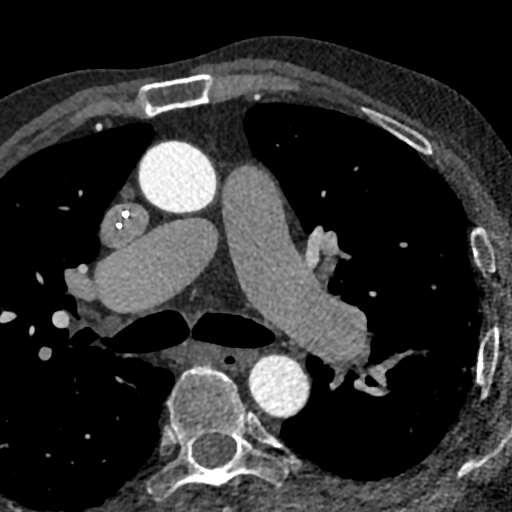

[Series 8: ts diast sharp 67 % · axial · 0.47mm/px · z∈[+1129,+1219]mm · 2 of 673 slices shown]
[im 225/673  lung]
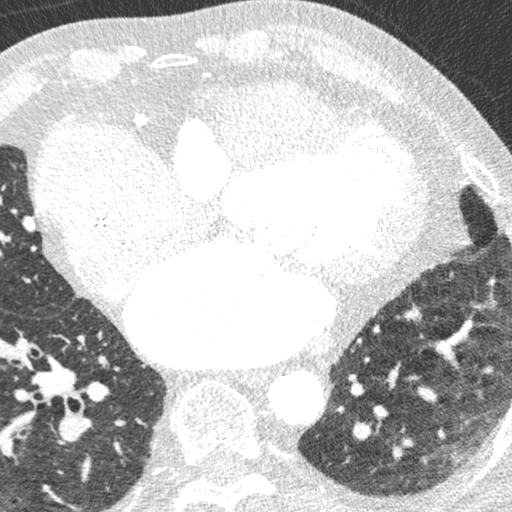
[im 449/673  lung]
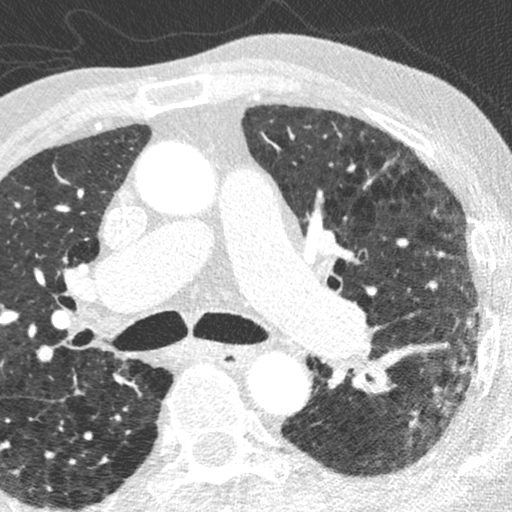

[Series 9: ts syst sharp 43 % · axial · 0.47mm/px · z∈[+1129,+1219]mm · 2 of 673 slices shown]
[im 225/673  lung]
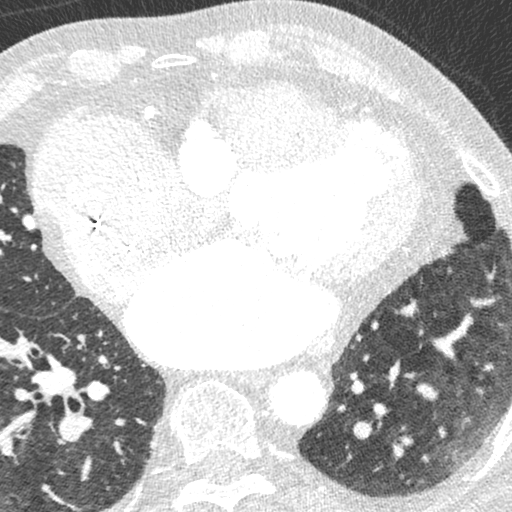
[im 449/673  lung]
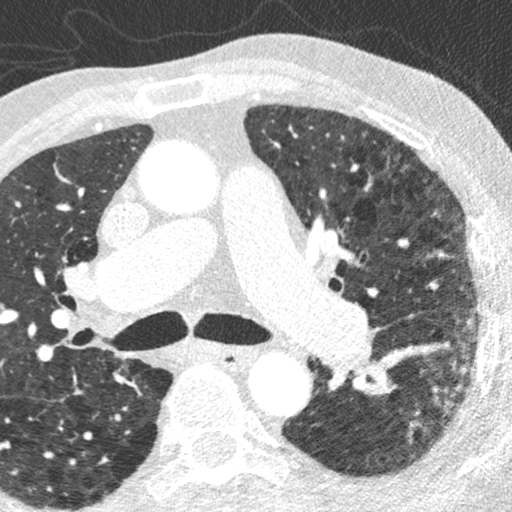

[8 of 20 positions shown; findings below may reference images not displayed]

FINDINGS: Aortic Valve: There is a pseudoaneurysm of the right coronary cusp
measuring 34 x 32 mm. As a result the right coronary cusp is
severely dilated measuring 61 mm in diameter. RCA originates above
the pseudoaneurysm.

Aorta: Aortic root is dilated as described above. Sinotubular
junction, ascending aorta, aortic arch and descending thoracic aorta
have normal size. There is no dissection. Mild diffuse
calcifications in the descending thoracic aorta.

Sinotubular Junction: 34 x 32 mm (previously 34 x 31 mm)

Ascending Thoracic Aorta: 34 x 33 mm (unchanged from prior)

Aortic Arch: 29 x 28 mm (unchanged from prior)

Descending Thoracic Aorta: 26 x 26 mm (previously 25 x 25 mm)

Sinus of Valsalva Measurements:

Non-coronary:  49 mm (unchanged from prior)

Right-coronary:  61 mm (unchanged from prior)

Left-coronary:  59 mm (unchanged from prior)

Other findings:

Mitral valve prosthesis sits well in the mitral position.

Left atrium is massively dilated.

There is normal pulmonary vein drainage into the left atrium.

Large left atrial appendage is large with no evidence of a thrombus.

Myocardium of the left ventricular apex is thinned with no evidence
of a thrombus.

Pacemaker leads are seen in the right atrium and right ventricle.

Dilated pulmonary artery measuring 38 mm.
IMPRESSION: 1. Stable size of the aortic root with a pseudoaneurysm of the right
coronary cusp.

2. Sinotubular junction, ascending aorta, aortic arch and descending
thoracic aorta have normal size and unchanged from the prior study.
There is no dissection.

3. Mitral valve prosthesis sits well in the mitral position. Left
atrium is massively dilated.

4. Myocardium of the left ventricular apex is thinned and mildly
aneurysmal with no evidence of a thrombus. Unchanged from the prior
study.

5. Significant calcifications of all three coronary arteries.

6. Dilated pulmonary artery measuring 38 mm.

EXAM:
OVER-READ INTERPRETATION  CT CHEST

The following report is an over-read performed by radiologist Dr.
Mosstapha Ae [[email or web address]] on 10/17/2017. This over-read
does not include interpretation of cardiac or coronary anatomy or
pathology. The coronary CTA interpretation by the cardiologist is
attached.
FINDINGS: Vascular: Large sinus of Valsalva aneurysm again noted, unchanged.
There is cardiomegaly.

Mediastinum/Nodes: Borderline scattered mediastinal lymph nodes,
stable. Left AP window has a short axis diameter of 11 mm. Other
similarly sized or smaller mediastinal lymph nodes. No hilar or
visualized axillary adenopathy.

Lungs/Pleura: Severe centrilobular emphysema. Bullous changes in the
apices. Scarring in both upper lobes and bases. No effusions.

Upper Abdomen: Imaging into the upper abdomen shows no acute
findings.

Musculoskeletal: Mild gynecomastia bilaterally, left slightly
greater than right. This is unchanged. No acute bony abnormality.
IMPRESSION: Severe bullous emphysematous changes. Scarring in the apices and
lung bases.

Stable mildly prominent mediastinal lymph nodes, likely reactive.

## 2019-05-21 ENCOUNTER — Other Ambulatory Visit: Payer: Self-pay

## 2019-05-21 ENCOUNTER — Encounter: Payer: Self-pay | Admitting: Nurse Practitioner

## 2019-05-21 ENCOUNTER — Ambulatory Visit (INDEPENDENT_AMBULATORY_CARE_PROVIDER_SITE_OTHER): Payer: Medicare Other | Admitting: Nurse Practitioner

## 2019-05-21 VITALS — BP 104/50 | HR 72 | Ht 73.0 in | Wt 155.0 lb

## 2019-05-21 DIAGNOSIS — I259 Chronic ischemic heart disease, unspecified: Secondary | ICD-10-CM | POA: Diagnosis not present

## 2019-05-21 DIAGNOSIS — Z7189 Other specified counseling: Secondary | ICD-10-CM

## 2019-05-21 DIAGNOSIS — Q2549 Other congenital malformations of aorta: Secondary | ICD-10-CM

## 2019-05-21 DIAGNOSIS — I5042 Chronic combined systolic (congestive) and diastolic (congestive) heart failure: Secondary | ICD-10-CM

## 2019-05-21 DIAGNOSIS — J449 Chronic obstructive pulmonary disease, unspecified: Secondary | ICD-10-CM

## 2019-05-21 DIAGNOSIS — I4821 Permanent atrial fibrillation: Secondary | ICD-10-CM | POA: Diagnosis not present

## 2019-05-21 NOTE — Patient Instructions (Addendum)
After Visit Summary:  We will be checking the following labs today - NONE   Medication Instructions:    Continue with your current medicines.    If you need a refill on your cardiac medications before your next appointment, please call your pharmacy.     Testing/Procedures To Be Arranged:  N/A  Follow-Up:   See me in 3 months    At [facility], you and your health needs are our priority.  As part of our continuing mission to provide you with exceptional heart care, we have created designated Provider Care Teams.  These Care Teams include your primary Cardiologist (physician) and Advanced Practice Providers (APPs -  Physician Assistants and Nurse Practitioners) who all work together to provide you with the care you need, when you need it.  Special Instructions:  . Stay safe, stay home, wash your hands for at least 20 seconds and wear a mask when out in public.  . It was good to talk with you today.    Call the [place] office at [phone] if you have any questions, problems or concerns.

## 2019-05-22 ENCOUNTER — Other Ambulatory Visit: Payer: Self-pay | Admitting: Pulmonary Disease

## 2019-05-23 ENCOUNTER — Other Ambulatory Visit: Payer: Self-pay

## 2019-05-23 ENCOUNTER — Other Ambulatory Visit: Payer: Self-pay | Admitting: *Deleted

## 2019-05-23 ENCOUNTER — Other Ambulatory Visit: Payer: Self-pay | Admitting: Adult Health

## 2019-05-23 ENCOUNTER — Encounter: Payer: Self-pay | Admitting: Adult Health

## 2019-05-23 ENCOUNTER — Ambulatory Visit (INDEPENDENT_AMBULATORY_CARE_PROVIDER_SITE_OTHER): Payer: Medicare Other | Admitting: Adult Health

## 2019-05-23 VITALS — BP 100/52 | HR 70 | Temp 98.0°F | Wt 152.0 lb

## 2019-05-23 DIAGNOSIS — I952 Hypotension due to drugs: Secondary | ICD-10-CM | POA: Diagnosis not present

## 2019-05-23 DIAGNOSIS — J449 Chronic obstructive pulmonary disease, unspecified: Secondary | ICD-10-CM | POA: Diagnosis not present

## 2019-05-23 DIAGNOSIS — I4821 Permanent atrial fibrillation: Secondary | ICD-10-CM | POA: Diagnosis not present

## 2019-05-23 DIAGNOSIS — I5042 Chronic combined systolic (congestive) and diastolic (congestive) heart failure: Secondary | ICD-10-CM

## 2019-05-23 DIAGNOSIS — I259 Chronic ischemic heart disease, unspecified: Secondary | ICD-10-CM

## 2019-05-23 DIAGNOSIS — E875 Hyperkalemia: Secondary | ICD-10-CM

## 2019-05-23 LAB — CBC WITH DIFFERENTIAL/PLATELET
Basophils Absolute: 0.1 10*3/uL (ref 0.0–0.1)
Basophils Relative: 1.3 % (ref 0.0–3.0)
Eosinophils Absolute: 0.2 10*3/uL (ref 0.0–0.7)
Eosinophils Relative: 2.4 % (ref 0.0–5.0)
HCT: 37.3 % — ABNORMAL LOW (ref 39.0–52.0)
Hemoglobin: 11.4 g/dL — ABNORMAL LOW (ref 13.0–17.0)
Lymphocytes Relative: 6.9 % — ABNORMAL LOW (ref 12.0–46.0)
Lymphs Abs: 0.6 10*3/uL — ABNORMAL LOW (ref 0.7–4.0)
MCHC: 30.6 g/dL (ref 30.0–36.0)
MCV: 78.6 fl (ref 78.0–100.0)
Monocytes Absolute: 0.8 10*3/uL (ref 0.1–1.0)
Monocytes Relative: 9.7 % (ref 3.0–12.0)
Neutro Abs: 6.5 10*3/uL (ref 1.4–7.7)
Neutrophils Relative %: 79.7 % — ABNORMAL HIGH (ref 43.0–77.0)
Platelets: 247 10*3/uL (ref 150.0–400.0)
RBC: 4.75 Mil/uL (ref 4.22–5.81)
RDW: 23.7 % — ABNORMAL HIGH (ref 11.5–15.5)
WBC: 8.2 10*3/uL (ref 4.0–10.5)

## 2019-05-23 LAB — BASIC METABOLIC PANEL
BUN: 29 mg/dL — ABNORMAL HIGH (ref 6–23)
CO2: 31 mEq/L (ref 19–32)
Calcium: 9.6 mg/dL (ref 8.4–10.5)
Chloride: 99 mEq/L (ref 96–112)
Creatinine, Ser: 1.06 mg/dL (ref 0.40–1.50)
GFR: 68.68 mL/min (ref >60.00)
Glucose, Bld: 167 mg/dL — ABNORMAL HIGH (ref 70–99)
Potassium: 5.3 mEq/L — ABNORMAL HIGH (ref 3.5–5.1)
Sodium: 139 mEq/L (ref 135–145)

## 2019-05-23 NOTE — Patient Outreach (Signed)
[facility]) Care Management  05/23/2019  NAMISH KRISE [birth year] [id number]    Telephone Assessment-Unsuccessful  RN completed another outreach call today however remains unsuccessful. RN able to leave a HIPAA approved voice message requesting a call back. Will further engage with case management services at that time.   PLAN: Will send outreach letter and rescheduled another outreach call over the next week.  Raina Mina, RN Care Management Coordinator [place] Office [phone]

## 2019-05-23 NOTE — Progress Notes (Signed)
Subjective:    Patient ID: [name], male    DOB: [birth year], 73 y.o.   MRN: [id number]  HPI  73 year old male who  has a past medical history of Atrial fibrillation ([place]), Atrial septal defect, Automatic implantable cardioverter-defibrillator in situ, Cardiomyopathy, CHF (congestive heart failure) ([place]), Chronic combined systolic and diastolic CHF (congestive heart failure) ([place]), Colon polyps, COPD (chronic obstructive pulmonary disease) ([place]), COPD GOLD II (01/11/2007), CVA (cerebral vascular accident) ([place]) (2009), Diabetes mellitus without complication ([place]), Dyslipidemia, Dysrhythmia, Ejection fraction < 50%, Endocarditis, Headache(784.0), HLD (hyperlipidemia), Hypertension, Intracranial hemorrhage ([place]), Lung cancer ([place]) (11/29/2013), Mitral regurgitation, Myocardial infarction ([employer]) (2010), Pacemaker, Permanent atrial fibrillation, Pneumonia (07/2018), Prosthetic valve dysfunction, Pulmonary hypertension ([place]), Renal artery stenosis (HCC), 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  Presents to the clinic today for TCM visit   Admit Date 04/27/2019 Discharge Date 05/02/2019  He was admitted with acute on chronic systolic heart failure.  He is felt to be end-stage C HF.  During this admission he was seen by palliative care but wished to remain DNR but otherwise wanted to proceed with all therapies and declined their services.  In the hospital his chest x-ray showed pulmonary edema bilaterally.  Diuresis was initiated but initially reported by hypotension and elevated creatinine.  Renal function returned to normal upon discharge.  His blood pressure remains soft so his ARB was held and discontinued on discharge.  He was seen by cardiology follow-up on 05/10/2019 as well as on 05/21/2019.  Today, he states that he is "doing great" and has  no complaints.  He has no chest pain.  He does wear his oxygen as directed, feels as though his breathing is okay.  Continues to take Lasix twice a day  Review of Systems  HENT: Positive for hearing loss.   Respiratory: Positive for shortness of breath (chronic).   Cardiovascular: Negative.   Gastrointestinal: Negative.   Genitourinary: Negative.   Musculoskeletal: Negative.   Neurological: Negative.   Psychiatric/Behavioral: Negative.    Past Medical History:  Diagnosis Date  . Atrial fibrillation ([place])    AV Node ablation January, 2010, for rapid atrial fib  . Atrial septal defect    Closed with surgery January, 2010  . Automatic implantable cardioverter-defibrillator in situ    LV dysfunction and pacer needed for AV node lesion  . Cardiomyopathy    non-ischemic  . CHF (congestive heart failure) ([place])   . Chronic combined systolic and diastolic CHF (congestive heart failure) ([place])   . Colon polyps   . COPD (chronic obstructive pulmonary disease) (HCC)    O2- 2 liters, nasal cannula, q night   . COPD GOLD II 01/11/2007   PFT's 09/25/13  FEV1  2.18 (61%) ratio 64 no change p B2 and DLC0  32% corrects to 68% - trial off advair and acei rec starting  08/23/2013     . CVA (cerebral vascular accident) ([place]) 2009   denies residual on 08/14/2013  . Diabetes mellitus without complication ([place])    type 2  . Dyslipidemia   . Dysrhythmia   . Ejection fraction < 50%   . Endocarditis    Bacterial, 2009  . Headache(784.0)    related to stroke only  . HLD (hyperlipidemia)   . Hypertension   . Intracranial hemorrhage (HCC)    Coumadin cannot be used because of the history of his bleed  .  Lung cancer ([place]) 11/29/2013   T1N0 Stage Ia non-small cell carcinoma left lung treated with wedge resection  . Mitral regurgitation    Severe symptomatic primary MR due to bacterial endocarditis, treated w/ MVR // Echo 1/18 EF 40-45, diffuse HK, dilated aorta at 42 mm/aortic root 47 mm; linear echogenic  structure in ascending aorta - suspect reverberation artifact / consider CT or TEE to rule out dissection flap, bioprosthetic MVR with mean gradient 3, severe LAE, low normal RVSF, severe RAE, moderate TR, PASP 42  . Myocardial infarction ([place]) 2010  . Pacemaker    combo pacer and icd  . Permanent atrial fibrillation    Originally Coumadin use for atrial fibrillation  //   he had intracerebral hemorrhage with an INR of 2.3 June, 2009. Anticoagulation could no longer be used.  //  Rapid atrial fibrillation after inferior MI October, 2010..........Marland Kitchen AV node ablation done at that time with ICD pacemaker placed (EF 35%).   //   Left atrial appendage tied off at the time of mitral valve surgery January, 2010 (maze pro  . Pneumonia 07/2018  . Prosthetic valve dysfunction    Mild mitral stenosis  . Pulmonary hypertension (HCC)    Moderate  . Renal artery stenosis (HCC)    Mild by history  . 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 Spontaneous pneumothorax    right thoracotomy - distant past  . Status post minimally invasive mitral valve replacement with bioprosthetic valve    33 mm Medtronic Mosaic porcine bioprosthesis placed via right mini thoracotomy for bacterial endocarditis complicated by severe MR and CHF   . Thoracic aortic aneurysm ([place]) 08/11/2016   a - Chest CTA 1/18:  Aneurysmal dilatation of aortic root is noted at 5.1 cm.     Social History   Socioeconomic History  . Marital status: Married    Spouse name: [name]  . Number of children: 0  . Years of education: College  . Highest education level: Not on file  Occupational History  . 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 Financial resource strain: Not on file  . Food insecurity    Worry: Not on file    Inability: Not on file  . Transportation needs    Medical: Not on file    Non-medical: Not on file  Tobacco Use  . Smoking status: Former Smoker    Packs/day: 1.00    Years:  45.00    Pack years: 45.00    Types: Cigarettes    Quit date: 08/11/2013    Years since quitting: 5.7  . Smokeless tobacco: Never Used  . Tobacco comment: 08/14/2013 "quit smoking in 2009"  Substance and Sexual Activity  . Alcohol use: No    Alcohol/week: 0.0 standard drinks    Comment: 08/14/2013 "used to drink beer; quit:in 1982"  . Drug use: No  . Sexual activity: Yes  Lifestyle  . Physical activity    Days per week: Not on file    Minutes per session: Not on file  . Stress: Not on file  Relationships  . Social Herbalist on phone: Not on file    Gets together: Not on file    Attends religious service: Not on file    Active member of club or organization: Not on file    Attends meetings of clubs or organizations: Not on file    Relationship status: Not on file  .  Intimate partner violence    Fear of current or ex partner: Not on file    Emotionally abused: Not on file    Physically abused: Not on file    Forced sexual activity: Not on file  Other Topics Concern  . Not on file  Social History Narrative   Patient lives at home with his spouse.   Caffeine Use: none   Worked for the [employer]   Has two boys and a girl. All live local.     Past Surgical History:  Procedure Laterality Date  . APPENDECTOMY    . ASD REPAIR, SECUNDUM  07/17/2008   pericardial patch closure of ASD  . AV NODE ABLATION  07/2008   for rapid atrial fib  . CARDIAC CATHETERIZATION    . CARDIAC DEFIBRILLATOR PLACEMENT  ~ 10 [employer]  . CARDIAC VALVE REPLACEMENT    . CATARACT EXTRACTION W/ INTRAOCULAR LENS  IMPLANT, BILATERAL Bilateral   . ESOPHAGOGASTRODUODENOSCOPY (EGD) WITH PROPOFOL N/A 09/29/2016   Procedure: ESOPHAGOGASTRODUODENOSCOPY (EGD) WITH PROPOFOL;  Surgeon: [name], MD;  Location: [facility] ENDOSCOPY;  Service: Endoscopy;  Laterality: N/A;  . HERNIA REPAIR    . IMPLANTABLE CARDIOVERTER DEFIBRILLATOR (ICD) GENERATOR CHANGE N/A 02/06/2012   Procedure: ICD GENERATOR CHANGE;   Surgeon: [name], MD;  Location: [facility] CATH LAB;  Service: Cardiovascular;  Laterality: N/A;  . INSERT / REPLACE / REMOVE PACEMAKER    . MASS BIOPSY Left    neck mass  . MITRAL VALVE REPLACEMENT Right 07/17/2008   57mm Medtronic Mosaic porcine bioprosthesis  . PENILE PROSTHESIS IMPLANT    . RIGHT HEART CATHETERIZATION N/A 08/16/2013   Procedure: RIGHT HEART CATH;  Surgeon: Josue Hector, MD;  Location: [facility] CATH LAB;  Service: Cardiovascular;  Laterality: N/A;  . TEE WITHOUT CARDIOVERSION N/A 09/06/2016   Procedure: TRANSESOPHAGEAL ECHOCARDIOGRAM (TEE);  Surgeon: Dorothy Spark, MD;  Location: [facility] ENDOSCOPY;  Service: Cardiovascular;  Laterality: N/A;  . THORACOTOMY Right 1970's   spontaneous pneumothorax - while in the [place]  . TOE SURGERY     left foot hammer toe  . TONSILLECTOMY    . VIDEO ASSISTED THORACOSCOPY (VATS)/WEDGE RESECTION Left 11/29/2013   Procedure: Video assisted thoracoscopy for wedge resection; mini thoracotomy;  Surgeon: [name], MD;  Location: [facility] OR;  Service: Thoracic;  Laterality: Left;    Family History  Problem Relation Age of Onset  . Stomach cancer Father   . Stroke Mother     Allergies  Allergen Reactions  . Anticoagulant Compound Other (See Comments)    Pt had intracranial bleed, therefore all anticoagulation is contraindicated per Dr. Ron Parker  . Other Other (See Comments)    Per Dr. Halford Chessman (Surgeon): stated that the patient cannot be put under for any surgery, as he has an enlarged aorta. He would stand only a 50/50 chance of surviving. He has lung issues, diminished lung tissue, COPD, and emphysema.  . Warfarin Sodium Other (See Comments)    Pt had intracranial bleed, therefore all anticoagulation is contraindicated per Dr. Ron Parker    Current Outpatient Medications on File Prior to Visit  Medication Sig Dispense Refill  . acetaminophen (TYLENOL) 325 MG tablet Take 325-650 mg by mouth every 8 (eight) hours as needed for headache.    .  albuterol (PROVENTIL HFA) 108 (90 Base) MCG/ACT inhaler Inhale 2 puffs into the lungs every 6 (six) hours as needed for wheezing or shortness of breath.    Marland Kitchen aspirin EC 81  MG tablet Take 1 tablet (81 mg total) by mouth daily. 90 tablet 3  . atorvastatin (LIPITOR) 10 MG tablet Take 10 mg by mouth daily.    . budesonide-formoterol (SYMBICORT) 160-4.5 MCG/ACT inhaler INHALE 2 PUFFS INTO THE LUNGS TWICE A DAY 10.2 Inhaler 6  . carvedilol (COREG) 3.125 MG tablet Take 3.125 mg by mouth 2 (two) times daily with a meal.    . furosemide (LASIX) 40 MG tablet Take 1 tablet (40 mg total) by mouth 2 (two) times daily. 60 tablet 0  . metFORMIN (GLUCOPHAGE) 1000 MG tablet Take 1,000 mg by mouth 2 (two) times daily with a meal.    . nitroGLYCERIN (NITROSTAT) 0.4 MG SL tablet Place 0.4 mg under the tongue every 5 (five) minutes x 3 doses as needed for chest pain.     . OXYGEN Inhale 2-3 L/min into the lungs continuous.      No current facility-administered medications on file prior to visit.     BP (!) 100/52   Pulse 70   Temp 98 F (36.7 C)   Wt 152 lb (68.9 kg)   SpO2 (!) 82%   BMI 20.05 kg/m       Objective:   Physical Exam Vitals signs and nursing note reviewed.  Constitutional:      Appearance: He is ill-appearing (chronic).  HENT:     Ears:     Comments: HOH   Cardiovascular:     Rate and Rhythm: Normal rate and regular rhythm.     Pulses: Normal pulses.     Heart sounds: Normal heart sounds.  Pulmonary:     Effort: Pulmonary effort is normal. No respiratory distress.     Breath sounds: Normal breath sounds. No stridor. No wheezing, rhonchi or rales.  Chest:     Chest wall: No tenderness.  Abdominal:     General: Abdomen is flat.     Palpations: Abdomen is soft.  Musculoskeletal: Normal range of motion.     Right lower leg: No edema.     Left lower leg: No edema.  Skin:    General: Skin is warm and dry.     Capillary Refill: Capillary refill takes less than 2 seconds.   Neurological:     General: No focal deficit present.     Mental Status: He is alert and oriented to person, place, and time.  Psychiatric:        Mood and Affect: Mood normal.        Behavior: Behavior normal. Behavior is cooperative.        Thought Content: Thought content normal.        Judgment: Judgment normal.       Assessment & Plan:  1. Chronic combined systolic and diastolic CHF (congestive heart failure) (HCC) -Continues to be DNR.  Overall he feels as though he is doing okay has no complaints today - Basic Metabolic Panel - CBC with Differential/Platelet  2. COPD GOLD II -On chronic oxygen. -Continue pulmonary follow-up as directed  3. Permanent atrial fibrillation (HCC) -Is not a candidate for anticoagulation due to previous intracranial bleed.  Tinea with medication management - Basic Metabolic Panel - CBC with Differential/Platelet  4. Hypotension due to drugs -Pressure continues to be low but he is asymptomatic.  Continue with Coreg and Lasix as directed - Basic Metabolic Panel - CBC with Differential/Platelet  [name], NP

## 2019-05-28 ENCOUNTER — Other Ambulatory Visit: Payer: Self-pay | Admitting: *Deleted

## 2019-05-28 NOTE — Patient Outreach (Signed)
[name]) Care Management  05/28/2019  [name] 13-May-1946 [id number]    Telephone Assessment-Unsuccessful (3rd outreach).  RN spoke with pt's wife [name]) who indicated pt just let the house. RN offered to follow up at another time or day (receptive). Will attempt to reach pt once again within the next week.   Raina Mina, RN Care Management Coordinator [place] Office [phone]

## 2019-05-29 ENCOUNTER — Ambulatory Visit (INDEPENDENT_AMBULATORY_CARE_PROVIDER_SITE_OTHER): Payer: Medicare Other | Admitting: *Deleted

## 2019-05-29 DIAGNOSIS — I482 Chronic atrial fibrillation, unspecified: Secondary | ICD-10-CM

## 2019-05-29 DIAGNOSIS — I428 Other cardiomyopathies: Secondary | ICD-10-CM

## 2019-05-30 ENCOUNTER — Other Ambulatory Visit: Payer: Self-pay

## 2019-05-30 LAB — CUP PACEART REMOTE DEVICE CHECK
Battery Remaining Longevity: 24 mo
Battery Remaining Percentage: 27 %
Battery Voltage: 2.83 V
Date Time Interrogation Session: 20201118083201
HighPow Impedance: 56 Ohm
HighPow Impedance: 56 Ohm
Implantable Lead Implant Date: 20100113
Implantable Lead Implant Date: 20100113
Implantable Lead Location: 753858
Implantable Lead Location: 753860
Implantable Lead Model: 7122
Implantable Pulse Generator Implant Date: 20130729
Lead Channel Impedance Value: 360 Ohm
Lead Channel Impedance Value: 690 Ohm
Lead Channel Pacing Threshold Amplitude: 0.625 V
Lead Channel Pacing Threshold Amplitude: 0.625 V
Lead Channel Pacing Threshold Pulse Width: 0.5 ms
Lead Channel Pacing Threshold Pulse Width: 0.5 ms
Lead Channel Sensing Intrinsic Amplitude: 11.7 mV
Lead Channel Setting Pacing Amplitude: 2 V
Lead Channel Setting Pacing Amplitude: 2 V
Lead Channel Setting Pacing Pulse Width: 0.5 ms
Lead Channel Setting Pacing Pulse Width: 0.5 ms
Lead Channel Setting Sensing Sensitivity: 0.5 mV
Pulse Gen Serial Number: 7053988

## 2019-05-31 ENCOUNTER — Other Ambulatory Visit (INDEPENDENT_AMBULATORY_CARE_PROVIDER_SITE_OTHER): Payer: Medicare Other

## 2019-05-31 DIAGNOSIS — E875 Hyperkalemia: Secondary | ICD-10-CM

## 2019-05-31 LAB — BASIC METABOLIC PANEL
BUN: 27 mg/dL — ABNORMAL HIGH (ref 6–23)
CO2: 29 mEq/L (ref 19–32)
Calcium: 9.2 mg/dL (ref 8.4–10.5)
Chloride: 101 mEq/L (ref 96–112)
Creatinine, Ser: 1 mg/dL (ref 0.40–1.50)
GFR: 73.45 mL/min (ref >60.00)
Glucose, Bld: 236 mg/dL — ABNORMAL HIGH (ref 70–99)
Potassium: 4.8 mEq/L (ref 3.5–5.1)
Sodium: 139 mEq/L (ref 135–145)

## 2019-06-03 ENCOUNTER — Other Ambulatory Visit: Payer: Self-pay | Admitting: *Deleted

## 2019-06-03 NOTE — Patient Outreach (Signed)
[place] [facility]) Care Management  06/03/2019  [name] Aug 27, 1945 [id number]   Telephone Assessment-Unsuccessful  RN attempted another outreach however remains unsuccessful. Will allow the 10 days from outreach letter that was mailed earlier this month. If no response will completed closure to provider and close this case via [facility] services. Note several unsuccessful outreach calls have taken place.  Raina Mina, RN Care Management Coordinator [place] Office [phone]

## 2019-06-07 ENCOUNTER — Other Ambulatory Visit: Payer: Self-pay

## 2019-06-07 ENCOUNTER — Inpatient Hospital Stay (HOSPITAL_COMMUNITY): Payer: Medicare Other

## 2019-06-07 ENCOUNTER — Emergency Department (HOSPITAL_COMMUNITY): Payer: Medicare Other

## 2019-06-07 ENCOUNTER — Other Ambulatory Visit: Payer: Self-pay | Admitting: Adult Health

## 2019-06-07 ENCOUNTER — Inpatient Hospital Stay (HOSPITAL_COMMUNITY)
Admission: EM | Admit: 2019-06-07 | Discharge: 2019-06-10 | DRG: 291 | Disposition: A | Discharge status: Home Health | Payer: Medicare Other | Attending: Internal Medicine | Admitting: Internal Medicine

## 2019-06-07 ENCOUNTER — Encounter (HOSPITAL_COMMUNITY): Payer: Self-pay

## 2019-06-07 DIAGNOSIS — I482 Chronic atrial fibrillation, unspecified: Secondary | ICD-10-CM | POA: Diagnosis not present

## 2019-06-07 DIAGNOSIS — Z79899 Other long term (current) drug therapy: Secondary | ICD-10-CM | POA: Diagnosis not present

## 2019-06-07 DIAGNOSIS — I272 Pulmonary hypertension, unspecified: Secondary | ICD-10-CM | POA: Diagnosis present

## 2019-06-07 DIAGNOSIS — I252 Old myocardial infarction: Secondary | ICD-10-CM

## 2019-06-07 DIAGNOSIS — Z800 Family history of malignant neoplasm of digestive organs: Secondary | ICD-10-CM | POA: Diagnosis not present

## 2019-06-07 DIAGNOSIS — Z85118 Personal history of other malignant neoplasm of bronchus and lung: Secondary | ICD-10-CM

## 2019-06-07 DIAGNOSIS — E43 Unspecified severe protein-calorie malnutrition: Secondary | ICD-10-CM | POA: Diagnosis present

## 2019-06-07 DIAGNOSIS — Z66 Do not resuscitate: Secondary | ICD-10-CM | POA: Diagnosis present

## 2019-06-07 DIAGNOSIS — Z7984 Long term (current) use of oral hypoglycemic drugs: Secondary | ICD-10-CM

## 2019-06-07 DIAGNOSIS — I110 Hypertensive heart disease with heart failure: Principal | ICD-10-CM | POA: Diagnosis present

## 2019-06-07 DIAGNOSIS — Z7951 Long term (current) use of inhaled steroids: Secondary | ICD-10-CM | POA: Diagnosis not present

## 2019-06-07 DIAGNOSIS — I5043 Acute on chronic combined systolic (congestive) and diastolic (congestive) heart failure: Secondary | ICD-10-CM | POA: Diagnosis not present

## 2019-06-07 DIAGNOSIS — I959 Hypotension, unspecified: Secondary | ICD-10-CM | POA: Diagnosis not present

## 2019-06-07 DIAGNOSIS — Z20828 Contact with and (suspected) exposure to other viral communicable diseases: Secondary | ICD-10-CM | POA: Diagnosis present

## 2019-06-07 DIAGNOSIS — I9589 Other hypotension: Secondary | ICD-10-CM | POA: Diagnosis not present

## 2019-06-07 DIAGNOSIS — Z953 Presence of xenogenic heart valve: Secondary | ICD-10-CM

## 2019-06-07 DIAGNOSIS — Q2549 Other congenital malformations of aorta: Secondary | ICD-10-CM | POA: Diagnosis not present

## 2019-06-07 DIAGNOSIS — Z95810 Presence of automatic (implantable) cardiac defibrillator: Secondary | ICD-10-CM

## 2019-06-07 DIAGNOSIS — Z9981 Dependence on supplemental oxygen: Secondary | ICD-10-CM | POA: Diagnosis not present

## 2019-06-07 DIAGNOSIS — Z7982 Long term (current) use of aspirin: Secondary | ICD-10-CM

## 2019-06-07 DIAGNOSIS — I509 Heart failure, unspecified: Secondary | ICD-10-CM

## 2019-06-07 DIAGNOSIS — E119 Type 2 diabetes mellitus without complications: Secondary | ICD-10-CM

## 2019-06-07 DIAGNOSIS — Z87891 Personal history of nicotine dependence: Secondary | ICD-10-CM | POA: Diagnosis not present

## 2019-06-07 DIAGNOSIS — I10 Essential (primary) hypertension: Secondary | ICD-10-CM | POA: Diagnosis not present

## 2019-06-07 DIAGNOSIS — R0902 Hypoxemia: Secondary | ICD-10-CM | POA: Diagnosis not present

## 2019-06-07 DIAGNOSIS — E1165 Type 2 diabetes mellitus with hyperglycemia: Secondary | ICD-10-CM | POA: Diagnosis present

## 2019-06-07 DIAGNOSIS — I5084 End stage heart failure: Secondary | ICD-10-CM | POA: Diagnosis present

## 2019-06-07 DIAGNOSIS — E785 Hyperlipidemia, unspecified: Secondary | ICD-10-CM | POA: Diagnosis present

## 2019-06-07 DIAGNOSIS — I152 Hypertension secondary to endocrine disorders: Secondary | ICD-10-CM | POA: Diagnosis present

## 2019-06-07 DIAGNOSIS — Z823 Family history of stroke: Secondary | ICD-10-CM

## 2019-06-07 DIAGNOSIS — I2510 Atherosclerotic heart disease of native coronary artery without angina pectoris: Secondary | ICD-10-CM | POA: Diagnosis present

## 2019-06-07 DIAGNOSIS — I428 Other cardiomyopathies: Secondary | ICD-10-CM

## 2019-06-07 DIAGNOSIS — Z6820 Body mass index (BMI) 20.0-20.9, adult: Secondary | ICD-10-CM

## 2019-06-07 DIAGNOSIS — R079 Chest pain, unspecified: Secondary | ICD-10-CM | POA: Diagnosis not present

## 2019-06-07 DIAGNOSIS — I4821 Permanent atrial fibrillation: Secondary | ICD-10-CM | POA: Diagnosis present

## 2019-06-07 DIAGNOSIS — R778 Other specified abnormalities of plasma proteins: Secondary | ICD-10-CM | POA: Diagnosis not present

## 2019-06-07 DIAGNOSIS — I5040 Unspecified combined systolic (congestive) and diastolic (congestive) heart failure: Secondary | ICD-10-CM

## 2019-06-07 DIAGNOSIS — Z8673 Personal history of transient ischemic attack (TIA), and cerebral infarction without residual deficits: Secondary | ICD-10-CM | POA: Diagnosis not present

## 2019-06-07 DIAGNOSIS — R062 Wheezing: Secondary | ICD-10-CM | POA: Diagnosis not present

## 2019-06-07 DIAGNOSIS — I4892 Unspecified atrial flutter: Secondary | ICD-10-CM | POA: Diagnosis present

## 2019-06-07 DIAGNOSIS — J9621 Acute and chronic respiratory failure with hypoxia: Secondary | ICD-10-CM | POA: Diagnosis present

## 2019-06-07 DIAGNOSIS — J449 Chronic obstructive pulmonary disease, unspecified: Secondary | ICD-10-CM | POA: Diagnosis present

## 2019-06-07 DIAGNOSIS — C3490 Malignant neoplasm of unspecified part of unspecified bronchus or lung: Secondary | ICD-10-CM | POA: Diagnosis present

## 2019-06-07 DIAGNOSIS — R0602 Shortness of breath: Secondary | ICD-10-CM | POA: Diagnosis not present

## 2019-06-07 DIAGNOSIS — I5023 Acute on chronic systolic (congestive) heart failure: Secondary | ICD-10-CM | POA: Diagnosis not present

## 2019-06-07 LAB — COMPREHENSIVE METABOLIC PANEL
ALT: 53 U/L — ABNORMAL HIGH (ref 0–44)
AST: 66 U/L — ABNORMAL HIGH (ref 15–41)
Albumin: 3.1 g/dL — ABNORMAL LOW (ref 3.5–5.0)
Alkaline Phosphatase: 56 U/L (ref 38–126)
Anion gap: 12 (ref 5–15)
BUN: 28 mg/dL — ABNORMAL HIGH (ref 8–23)
CO2: 24 mmol/L (ref 22–32)
Calcium: 8.6 mg/dL — ABNORMAL LOW (ref 8.9–10.3)
Chloride: 103 mmol/L (ref 98–111)
Creatinine, Ser: 1.29 mg/dL — ABNORMAL HIGH (ref 0.61–1.24)
GFR calc Af Amer: >60 mL/min (ref >60)
GFR calc non Af Amer: 55 mL/min — ABNORMAL LOW (ref >60)
Glucose, Bld: 226 mg/dL — ABNORMAL HIGH (ref 70–99)
Potassium: 4.1 mmol/L (ref 3.5–5.1)
Sodium: 139 mmol/L (ref 135–145)
Total Bilirubin: 2.3 mg/dL — ABNORMAL HIGH (ref 0.3–1.2)
Total Protein: 6 g/dL — ABNORMAL LOW (ref 6.5–8.1)

## 2019-06-07 LAB — ECHOCARDIOGRAM COMPLETE
Height: 73 in
Weight: 2433.88 oz

## 2019-06-07 LAB — BRAIN NATRIURETIC PEPTIDE: B Natriuretic Peptide: 1011.1 pg/mL — ABNORMAL HIGH (ref 0.0–100.0)

## 2019-06-07 LAB — CBC
HCT: 37.9 % — ABNORMAL LOW (ref 39.0–52.0)
Hemoglobin: 10.7 g/dL — ABNORMAL LOW (ref 13.0–17.0)
MCH: 23.5 pg — ABNORMAL LOW (ref 26.0–34.0)
MCHC: 28.2 g/dL — ABNORMAL LOW (ref 30.0–36.0)
MCV: 83.3 fL (ref 80.0–100.0)
Platelets: 222 10*3/uL (ref 150–400)
RBC: 4.55 MIL/uL (ref 4.22–5.81)
RDW: 22 % — ABNORMAL HIGH (ref 11.5–15.5)
WBC: 9 10*3/uL (ref 4.0–10.5)
nRBC: 0 % (ref 0.0–0.2)

## 2019-06-07 LAB — URINALYSIS, ROUTINE W REFLEX MICROSCOPIC
Bilirubin Urine: NEGATIVE
Glucose, UA: 150 mg/dL — AB
Ketones, ur: NEGATIVE mg/dL
Nitrite: NEGATIVE
Protein, ur: NEGATIVE mg/dL
Specific Gravity, Urine: 1.013 (ref 1.005–1.030)
WBC, UA: >50 WBC/hpf — ABNORMAL HIGH (ref 0–5)
pH: 5 (ref 5.0–8.0)

## 2019-06-07 LAB — CBG MONITORING, ED: Glucose-Capillary: 207 mg/dL — ABNORMAL HIGH (ref 70–99)

## 2019-06-07 LAB — TROPONIN I (HIGH SENSITIVITY)
Troponin I (High Sensitivity): 44 ng/L — ABNORMAL HIGH (ref <18)
Troponin I (High Sensitivity): 72 ng/L — ABNORMAL HIGH (ref <18)

## 2019-06-07 LAB — POC SARS CORONAVIRUS 2 AG -  ED: SARS Coronavirus 2 Ag: NEGATIVE

## 2019-06-07 LAB — SARS CORONAVIRUS 2 BY RT PCR (HOSPITAL ORDER, PERFORMED IN ~~LOC~~ HOSPITAL LAB): SARS Coronavirus 2: NEGATIVE

## 2019-06-07 LAB — GLUCOSE, CAPILLARY: Glucose-Capillary: 187 mg/dL — ABNORMAL HIGH (ref 70–99)

## 2019-06-07 MED ORDER — SODIUM CHLORIDE 0.9 % IV SOLN: 250.0000 mL | INTRAVENOUS | Status: DC | PRN

## 2019-06-07 MED ORDER — ALBUTEROL SULFATE HFA 108 (90 BASE) MCG/ACT IN AERS
4.0000 | INHALATION_SPRAY | Freq: Once | RESPIRATORY_TRACT | Status: AC
  Administered 2019-06-07: 11:00:00 4 via RESPIRATORY_TRACT
  Filled 2019-06-07: qty 6.7

## 2019-06-07 MED ORDER — CARVEDILOL 3.125 MG PO TABS
3.1250 mg | ORAL_TABLET | Freq: Two times a day (BID) | ORAL | Status: DC
  Administered 2019-06-07 – 2019-06-08 (×2): 3.125 mg via ORAL
  Filled 2019-06-07 (×3): qty 1

## 2019-06-07 MED ORDER — INSULIN ASPART 100 UNIT/ML ~~LOC~~ SOLN
0.0000 [IU] | Freq: Three times a day (TID) | SUBCUTANEOUS | Status: DC
  Administered 2019-06-08: 12:00:00 5 [IU] via SUBCUTANEOUS
  Administered 2019-06-08: 07:00:00 3 [IU] via SUBCUTANEOUS
  Administered 2019-06-08 – 2019-06-09 (×3): 2 [IU] via SUBCUTANEOUS
  Administered 2019-06-09 – 2019-06-10 (×2): 3 [IU] via SUBCUTANEOUS
  Administered 2019-06-10: 09:00:00 8 [IU] via SUBCUTANEOUS
  Administered 2019-06-10: 12:00:00 3 [IU] via SUBCUTANEOUS

## 2019-06-07 MED ORDER — FUROSEMIDE 10 MG/ML IJ SOLN
60.0000 mg | Freq: Once | INTRAMUSCULAR | Status: AC
  Administered 2019-06-07: 12:00:00 60 mg via INTRAVENOUS
  Filled 2019-06-07: qty 6

## 2019-06-07 MED ORDER — ONDANSETRON HCL 4 MG/2ML IJ SOLN: 4.0000 mg | Freq: Four times a day (QID) | INTRAMUSCULAR | Status: DC | PRN

## 2019-06-07 MED ORDER — SODIUM CHLORIDE 0.9% FLUSH: 3.0000 mL | INTRAVENOUS | Status: DC | PRN

## 2019-06-07 MED ORDER — SODIUM CHLORIDE 0.9% FLUSH
3.0000 mL | Freq: Two times a day (BID) | INTRAVENOUS | Status: DC
  Administered 2019-06-08 – 2019-06-10 (×5): 3 mL via INTRAVENOUS

## 2019-06-07 MED ORDER — IPRATROPIUM BROMIDE HFA 17 MCG/ACT IN AERS
2.0000 | INHALATION_SPRAY | Freq: Once | RESPIRATORY_TRACT | Status: AC
  Administered 2019-06-07: 11:00:00 2 via RESPIRATORY_TRACT
  Filled 2019-06-07: qty 12.9

## 2019-06-07 MED ORDER — ATORVASTATIN CALCIUM 10 MG PO TABS
10.0000 mg | ORAL_TABLET | Freq: Every day | ORAL | Status: DC
  Administered 2019-06-08 – 2019-06-10 (×3): 10 mg via ORAL
  Filled 2019-06-07 (×3): qty 1

## 2019-06-07 MED ORDER — FUROSEMIDE 10 MG/ML IJ SOLN
40.0000 mg | Freq: Two times a day (BID) | INTRAMUSCULAR | Status: DC
  Administered 2019-06-07 – 2019-06-09 (×5): 40 mg via INTRAVENOUS
  Filled 2019-06-07 (×5): qty 4

## 2019-06-07 MED ORDER — SODIUM CHLORIDE 0.9 % IV SOLN: 500.0000 mg | INTRAVENOUS | Status: DC

## 2019-06-07 MED ORDER — SODIUM CHLORIDE 0.9 % IV SOLN: 2.0000 g | INTRAVENOUS | Status: DC

## 2019-06-07 MED ORDER — ALBUTEROL SULFATE (2.5 MG/3ML) 0.083% IN NEBU: 3.0000 mL | INHALATION_SOLUTION | Freq: Four times a day (QID) | RESPIRATORY_TRACT | Status: DC | PRN

## 2019-06-07 MED ORDER — ASPIRIN EC 81 MG PO TBEC
81.0000 mg | DELAYED_RELEASE_TABLET | Freq: Every day | ORAL | Status: DC
  Administered 2019-06-08 – 2019-06-10 (×3): 81 mg via ORAL
  Filled 2019-06-07 (×3): qty 1

## 2019-06-07 MED ORDER — MOMETASONE FURO-FORMOTEROL FUM 200-5 MCG/ACT IN AERO
2.0000 | INHALATION_SPRAY | Freq: Two times a day (BID) | RESPIRATORY_TRACT | Status: DC
  Administered 2019-06-08 – 2019-06-10 (×4): 2 via RESPIRATORY_TRACT
  Filled 2019-06-07 (×2): qty 8.8

## 2019-06-07 MED ORDER — ACETAMINOPHEN 325 MG PO TABS: 650.0000 mg | ORAL_TABLET | ORAL | Status: DC | PRN

## 2019-06-07 NOTE — ED Notes (Signed)
Wife(Judy [place]) [phone], and daughter Bryndon [place]- [phone]. Called for pt updates.

## 2019-06-07 NOTE — ED Notes (Signed)
Dr Lorin Mercy aware of trop

## 2019-06-07 NOTE — Telephone Encounter (Signed)
Medication Refill - Medication: furosemide (LASIX) 40 MG tablet  Has the patient contacted their pharmacy? yes (Agent: If no, request that the patient contact the pharmacy for the refill.) (Agent: If yes, when and what did the pharmacy advise?)  Preferred Pharmacy (with phone number or street name):  [id number] - , [place] [phone] (Phone) [phone] (Fax)   Agent: Please be advised that RX refills may take up to 3 business days. We ask that you follow-up with your pharmacy.

## 2019-06-07 NOTE — ED Notes (Signed)
Call report to [facility], advised to wait until after shift change to bring pt as they are having a rapid response situation with the primary nurse and several others.

## 2019-06-07 NOTE — ED Notes (Signed)
Attempted to give report to receiving 3E nurse; nurse will give me a call back.

## 2019-06-07 NOTE — H&P (Signed)
History and Physical    [name] ZLD:[id number] DOB: 1945/12/23 DOA: 06/07/2019  PCP: [name], NP Consultants:  Lovena Le - cardiology; [facility] - pulmonology; [facility] - oncology; Roxy Manns - CT surgery; Ardis Hughs - GI Patient coming from:  Home - lives with wife; NOK: Wife, [phone]  Chief Complaint: SOB   HPI: [name] is a 73 y.o. male with medical history significant of AAA; MVR; RAS; moderate pulmonary HTN; afib s/p nodal ablation (unable to be on [facility] due to [place]); lung CA (2015); HTN; HLD; DM; CVA (2009); COPD on 3L [place] O2; and chronic combined CHF with AICD placement presenting with SOB.   He reports that he "couldn't breathe good" for the last 2 days.  No cough.  No weight change.  No LE edema.  Nothing makes it worse.  His wife reports that they have an oxygen machine at home but he was down to 58% when EMS arrived.  She thinks it unlikely that he has COVID.  His daughter will be his designated visitor.   ED Course:  EF 25%, h/o lung cancer, COPD at baseline.  SOB x few days.  Wears 3L home O2, here with oxygen mask with improved sats.  +edema on CXR, elevated BNP.  Mainly CHF. Rapid COVID negative.  Rectal temp 100, added cultures, lactate, covered with abx for possible PNA.  He is DNR/DNI.  Palliative care has consulted in the past.  Review of Systems: As per HPI; otherwise review of systems reviewed and negative.   Ambulatory Status:  Ambulates without assistance  Past Medical History:  Diagnosis Date  . Atrial septal defect    Closed with surgery January, 2010  . Automatic implantable cardioverter-defibrillator in situ    LV dysfunction and pacer needed for AV node lesion  . Chronic combined systolic and diastolic CHF (congestive heart failure) ([place])   . Colon polyps   . COPD (chronic obstructive pulmonary disease) (HCC)    O2- 2 liters, nasal cannula, q night   . CVA (cerebral vascular accident) ([place]) 2009   denies residual on 08/14/2013  . Diabetes mellitus without  complication ([place])    type 2  . Dyslipidemia   . Endocarditis    Bacterial, 2009  . Hypertension   . Intracranial hemorrhage (HCC)    Coumadin cannot be used because of the history of his bleed  . Lung cancer ([place]) 11/29/2013   T1N0 Stage Ia non-small cell carcinoma left lung treated with wedge resection  . Myocardial infarction ([place]) 2010  . Pacemaker    combo pacer and icd  . Permanent atrial fibrillation    Originally Coumadin use for atrial fibrillation  //   he had intracerebral hemorrhage with an INR of 2.3 June, 2009. Anticoagulation could no longer be used.  //  Rapid atrial fibrillation after inferior MI October, 2010..........Marland Kitchen AV node ablation done at that time with ICD pacemaker placed (EF 35%).   //   Left atrial appendage tied off at the time of mitral valve surgery January, 2010 (maze pro  . Pneumonia 07/2018  . Pulmonary hypertension (HCC)    Moderate  . Renal artery stenosis (HCC)    Mild by history  . 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 Spontaneous pneumothorax    right thoracotomy - distant past  . Status post minimally invasive mitral valve replacement with bioprosthetic valve    33 mm Medtronic Mosaic porcine bioprosthesis placed via right mini thoracotomy for bacterial endocarditis complicated by severe MR and CHF   .  Thoracic aortic aneurysm ([place]) 08/11/2016   a - Chest CTA 1/18:  Aneurysmal dilatation of aortic root is noted at 5.1 cm.     Past Surgical History:  Procedure Laterality Date  . APPENDECTOMY    . ASD REPAIR, SECUNDUM  07/17/2008   pericardial patch closure of ASD  . AV NODE ABLATION  07/2008   for rapid atrial fib  . CARDIAC CATHETERIZATION    . CARDIAC DEFIBRILLATOR PLACEMENT  ~ [address] [facility]  . CARDIAC VALVE REPLACEMENT    . CATARACT EXTRACTION W/ INTRAOCULAR LENS  IMPLANT, BILATERAL Bilateral   . ESOPHAGOGASTRODUODENOSCOPY (EGD) WITH PROPOFOL N/A 09/29/2016   Procedure: ESOPHAGOGASTRODUODENOSCOPY (EGD) WITH PROPOFOL;  Surgeon: [name], MD;  Location: [facility] ENDOSCOPY;  Service: Endoscopy;  Laterality: N/A;  . HERNIA REPAIR    . IMPLANTABLE CARDIOVERTER DEFIBRILLATOR (ICD) GENERATOR CHANGE N/A 02/06/2012   Procedure: ICD GENERATOR CHANGE;  Surgeon: [name], MD;  Location: [facility] C[facility] LAB;  Service: Cardiovascular;  Laterality: N/A;  . INSERT / REPLACE / REMOVE PACEMAKER    . MASS BIOPSY Left    neck mass  . MITRAL VALVE REPLACEMENT Right 07/17/2008   56mm Medtronic Mosaic porcine bioprosthesis  . PENILE PROSTHESIS IMPLANT    . RIGHT HEART CATHETERIZATION N/A 08/16/2013   Procedure: RIGHT HEART CATH;  Surgeon: Josue Hector, MD;  Location: [facility] CATH LAB;  Service: Cardiovascular;  Laterality: N/A;  . TEE WITHOUT CARDIOVERSION N/A 09/06/2016   Procedure: TRANSESOPHAGEAL ECHOCARDIOGRAM (TEE);  Surgeon: Dorothy Spark, MD;  Location: [facility] ENDOSCOPY;  Service: Cardiovascular;  Laterality: N/A;  . THORACOTOMY Right 1970's   spontaneous pneumothorax - while in the [place]  . TOE SURGERY     left foot hammer toe  . TONSILLECTOMY    . VIDEO ASSISTED THORACOSCOPY (VATS)/WEDGE RESECTION Left 11/29/2013   Procedure: Video assisted thoracoscopy for wedge resection; mini thoracotomy;  Surgeon: [name], MD;  Location: [place];  Service: Thoracic;  Laterality: Left;    Social History   Socioeconomic History  . Marital status: Married    Spouse name: [name]  . Number of children: 0  . Years of education: College  . Highest education level: Not on file  Occupational History  . 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 Financial resource strain: Not on file  . Food insecurity    Worry: Not on file    Inability: Not on file  . Transportation needs    Medical: Not on file    Non-medical: Not on file  Tobacco Use  . Smoking status: Former Smoker    Packs/day: 1.00    Years: 45.00    Pack years: 45.00    Types: Cigarettes    Quit date: 2011    Years since quitting: 9.9  .  Smokeless tobacco: Never Used  Substance and Sexual Activity  . Alcohol use: No    Alcohol/week: 0.0 standard drinks    Comment: 08/14/2013 "used to drink beer; quit:in 1982"  . Drug use: No  . Sexual activity: Yes  Lifestyle  . Physical activity    Days per week: Not on file    Minutes per session: Not on file  . Stress: Not on file  Relationships  . Social Herbalist on phone: Not on file    Gets together: Not on file    Attends religious service: Not on file    Active  member of club or organization: Not on file    Attends meetings of clubs or organizations: Not on file    Relationship status: Not on file  . Intimate partner violence    Fear of current or ex partner: Not on file    Emotionally abused: Not on file    Physically abused: Not on file    Forced sexual activity: Not on file  Other Topics Concern  . Not on file  Social History Narrative   Patient lives at home with his spouse.   Caffeine Use: none   Worked for the post [employer]   Has two boys and a girl. All live local.     Allergies  Allergen Reactions  . Anticoagulant Compound Other (See Comments)    Pt had intracranial bleed, therefore all anticoagulation is contraindicated per Dr. Ron Parker  . Other Other (See Comments)    Per Dr. Halford Chessman (Surgeon): stated that the patient cannot be put under for any surgery, as he has an enlarged aorta. He would stand only a 50/50 chance of surviving. He has lung issues, diminished lung tissue, COPD, and emphysema.  . Warfarin Sodium Other (See Comments)    Pt had intracranial bleed, therefore all anticoagulation is contraindicated per Dr. Ron Parker    Family History  Problem Relation Age of Onset  . Stomach cancer Father   . Stroke Mother     Prior to Admission medications   Medication Sig Start Date End Date Taking? Authorizing Provider  acetaminophen (TYLENOL) 325 MG tablet Take 325-650 mg by mouth every 8 (eight) hours as needed for headache.    [provider]  albuterol (PROVENTIL HFA) 108 (90 Base) MCG/ACT inhaler Inhale 2 puffs into the lungs every 6 (six) hours as needed for wheezing or shortness of breath.    [provider]  aspirin EC 81 MG tablet Take 1 tablet (81 mg total) by mouth daily. 12/07/12   Carlena Bjornstad, MD  atorvastatin (LIPITOR) 10 MG tablet Take 10 mg by mouth daily.    [provider]  budesonide-formoterol (SYMBICORT) 160-4.5 MCG/ACT inhaler INHALE 2 PUFFS INTO THE LUNGS TWICE A DAY 05/22/19   Lauraine Rinne, NP  carvedilol (COREG) 3.125 MG tablet Take 3.125 mg by mouth 2 (two) times daily with a meal.    [provider]  furosemide (LASIX) 40 MG tablet Take 1 tablet (40 mg total) by mouth 2 (two) times daily. 05/02/19   Ezekiel Slocumb, DO  metFORMIN (GLUCOPHAGE) 1000 MG tablet Take 1,000 mg by mouth 2 (two) times daily with a meal.    [provider]  nitroGLYCERIN (NITROSTAT) 0.4 MG SL tablet Place 0.4 mg under the tongue every 5 (five) minutes x 3 doses as needed for chest pain.     [provider]  OXYGEN Inhale 2-3 L/min into the lungs continuous.     [provider]    Physical Exam: Vitals:   06/07/19 1715 06/07/19 1745 06/07/19 1754 06/07/19 1800  BP: 117/65 113/70 113/70 118/68  Pulse: 72 70 74 73  Resp: 19 (!) 21 20 (!) 24  Temp:      TempSrc:      SpO2: 92% 99% 100% 98%  Weight:      Height:         . General:  Appears ill, on face mask O2 . Eyes:  PERRL, EOMI, normal lids, iris . ENT:  Hard of hearing, normal lips & tongue, mmm . Neck:  no LAD, masses or thyromegaly . Cardiovascular:  RRR, no m/r/g. 1-2+ LE edema.  Marland Kitchen Respiratory:   Bibasilar crackles, L > R.  Increased respiratory effort on FM O2 . Abdomen:  soft, NT, ND, NABS . Back:   normal alignment, no CVAT . Skin:  no rash or induration seen on limited exam . Musculoskeletal:  grossly normal tone BUE/BLE, good ROM, no bony abnormality . Psychiatric:  Mildly anxious mood and  affect, speech fluent and appropriate, AOx3 . Neurologic:  CN 2-12 grossly intact, moves all extremities in coordinated fashion, sensation intact    Radiological Exams on Admission: Xr Chest Portable  Result Date: 06/07/2019 CLINICAL DATA:  Shortness of breath, feeling bad for 2 days. EXAM: PORTABLE CHEST 1 VIEW COMPARISON:  04/27/2019 FINDINGS: Cardiomediastinal contours remain enlarged. Signs of right-sided dual lead pacer defibrillator are unchanged. Signs of increased interstitial markings are increased from the prior exam. Postoperative changes in the left lung are again noted. Background pulmonary emphysema as before. Graded opacity at the right lung base suggests pleural effusion. No acute bone findings. IMPRESSION: 1. Worsening pulmonary edema. 2. Graded opacity at the right lung base suggests pleural effusion. 3. Background pulmonary emphysema. 4. Stable enlargement of cardiomediastinal contours. Electronically Signed   By: Zetta Bills M.D.   On: 06/07/2019 10:41    EKG: Independently reviewed.  Afib with ventricular escape complexes with rate 74; nonspecific ST changes with no evidence of acute ischemia   Labs on Admission: I have personally reviewed the available labs and imaging studies at the time of the admission.  Pertinent labs:   Glucose 226 BUN 28/Creatinine 1.29/GFR 55 - at or near baseline Albumin 3.1 Bili 2.3 BNP 1011.1 HS troponin 44, 72 WBC 9.0 Hgb 10.7 Rapid COVID negative; confirmatory testing is also negative UA: 150 glucose, small Hgb, large LE, rare bacteria  Assessment/Plan Principal Problem:   Acute on chronic respiratory failure with hypoxia (HCC) Active Problems:   COPD GOLD II   Hypertension   Dyslipidemia   Diabetes (HCC)   Lung cancer (HCC)   Acute on chronic combined systolic (congestive) and diastolic (congestive) heart failure (HCC)    Acute on chronic respiratory failure associated with decompensated severe systolic CHF -Patient  with chronic respiratory failure on 3L home O2 presenting with presenting with worsening SOB and hypoxia  -CXR consistent with pulmonary edema -Normal WBC count -Elevated BNP -With elevated BNP and abnl CXR, recurrent CHF seems probable as diagnosis -COVID negative -Will admit, as per the Emergency HF Mortality Risk Grade.  The patient has:  severe pulmonary edema requiring new O2 therapy. -Currently with NRB O2 and maintaining sats so long as he doesn't move; will keep NPO until more stable from a respiratory standpoint. -prn BIPAP -Repeat echo does not appear significantly worse than prior but possibly mildly worse with EF 25-30%; aortic root aneurysm appears to be stable. -Will continue ASA -Will hold ACE due to renal dysfunction but since this appears to be stable would consider adding once diuresing well -Continue Coreg -CHF order set utilized -Cardiology consult -Was given Lasix 40 mg x 1 in ER and will repeat with 40 mg IV BID -Doubt ACS based on symptoms - elevated troponin is likely associated with demand ischemia -No AC due to h/o ICH  HTN -Takes low-dose Coreg monotherapy at home, will continue  HLD -Continue Lipitor -Lipids were checked on 04/12/19 (TC 85, HDL 45, LDL 20, TG 95) so will not repeat at this time  DM, uncontrolled -Last A1c  was 7.5 on 10/2 -Hold Glucophage -Will cover with moderate-scale SSI for now  COPD -Patient does not describe cough, wheezing, or infectious symptoms -Low current suspicious for COPD exacerbation -Continue O2 and treatment as above -Rectal temp was 100 and ER physician was concerned about PNA - as noted above, low suspicion for this currently and so antibiotics discontinued for now -Blood cultures are pending  Lung cancer -Stage 1A NSCLC, well-differentiated adenocarcinoma in 08/2013, s/p wedge resection -On observation for  Now -Due for CT to further evaluate mediastinal LAD in January  Goals of care -Has previously refused  palliative care services -His daughter seemed very concerned, "Is my daddy going to die?"  -He is DNR but for now will continue to aggressively treat   Note: This patient has been tested and is negative for the novel coronavirus COVID-19 by both BD and Cepheid testing.    DVT prophylaxis: SCDs due to h/o ICH on Coumadin Code Status:  DNR - confirmed with patient Family Communication: None present; I spoke with his wife and daughter by telephone Disposition Plan:  Home once clinically improved Consults called: Cardiology; CM/SW/PT/OT Admission status: Admit - It is my clinical opinion that admission to INPATIENT is reasonable and necessary because this patient will require at least 2 midnights in the hospital to treat this condition based on the medical complexity of the problems presented.  Given the aforementioned information, the predictability of an adverse outcome is felt to be significant.     Karmen Bongo MD [facility]   How to contact the [facility] Attending or Consulting provider [place] or covering provider during after hours [place], for this patient?  1. Check the care team in [facility] and look for a) attending/consulting [facility] provider listed and b) the [facility] team listed 2. Log into [email or web address] and use [place]'s universal password to access. If you do not have the password, please contact the hospital operator. 3. Locate the [facility] provider you are looking for under [facility] and page to a number that you can be directly reached. 4. If you still have difficulty reaching the provider, please page the [facility] (Director on Call) for the Hospitalists listed on amion for assistance.   06/07/2019, 6:10 PM

## 2019-06-07 NOTE — ED Notes (Signed)
Attempted to call report again to [place]; nurse was handling a patient situation and is not currently available for report. Will attempt again in 15 min.

## 2019-06-07 NOTE — ED Triage Notes (Signed)
C/o feeling bad for two days, today became extremely shob, wears home O2, initial pulse ox readings 58%, then EMS placed non rebreather 100%   Saturations immediately fall when 02 turned off, while transferring to wall acess.

## 2019-06-07 NOTE — Telephone Encounter (Signed)
Forwarding medication refill request the PCP for review.

## 2019-06-07 NOTE — Consult Note (Signed)
Cardiology Consultation:   Patient ID: [name] MRN: [id number]; DOB: [birth year]  Admit date: 06/07/2019 Date of Consult: 06/07/2019  Primary Care Provider: [name], NP Primary Cardiologist: Candee Furbish, MD  Primary Electrophysiologist:  Cristopher Peru, MD    Patient Profile:   [name] is a 73 y.o. male with a hx of MV endocarditis c/b severe MR s/p minimally invasive bioprosthetic MVR 2018, moderate pulmonary HTN, non-ischemic cardiomyopathy, aifb s/p nodal ablation (not able to on [facility] due to [place]),  s/p BI -V-ICD, lung cancer, sinus valsalva aneurysm, HTN, HLD, DM, CVA (2009), COPD on 3L [place] O2, chronic combined CHF with AICD placement who is being seen today for the evaluation of shortness of breath at the request of Dr. Lorin Mercy.  History of Present Illness:   [name] follows with Dr. Marlou Porch for the above cardiac issues. His last ischemic evaluation was in 2009 which revealed 30% distal LAD stenosis, otherwise normal coronaries. He had follow-up coronary CTA studies which mention significant coronary artery calcifications of the 3 major vessels, but does not classify them further. He has a h/o of afib s/p ablation, but has not been on anticoagulation due to h/o IHC. He has a h/o of sinus valsalva aneurysm follows by Dr. Roxy Manns with annual cardiac CTA, stable on last check 12/2018. EF in January 2020 was 35-40% with diffuse hypokinesis PA peak pressure 49 mm Hg.   The patient was hospitalized 10/17-10/22/20 for acute heart failure and chest pain. Echo showed EF 25-30%, mild LVH, diastolic dysfunction, moderately reduced RV function, LA and RA severely dilated, mild aortic calcification, trace MR, aortic root dilated, moderately elevated pulmonary artery pressures.  He was felt to be end stage CHF and he was seen by palliative care (but declined services), changed to DNR at that time. He was discharged on lasix 40 mg BID.   His last office visit was 05/21/2019 and was doing OK. No  chest pain. Weight was stable at that point. BP chronically soft.   The patient presetned to the ED 06/07/19 for increasing shortness of breath for the last 2-3 days. He is normally on 2-3 L O2. He was at first short of breath on exertion but this morning he felt acutely short of breath at rest. He also noted chest pain/tightness on the left side of his chest that came on with the sob. When symptoms continued to worsen EMS was called. The reported O2 in the 60s. He was placed on supplemental O2. Denied leg or pain swelling.   In the ED pulse 74, RR 24. Labs revealed potassium 4.1, glucose 226, creatinine 1.29 (baseline 1), albumin 3.1. AST 66, ALT, 53. WBC 9.0, Hgb 10.7. BNP 1,011. HS troponin 44 > 72. CXR showed worsening pulmonary edema, graded opacity at the right lung base suggestive of pleural effusion, chronic emphysema. He was given IV lasix. Patient confirmed DNI. Bps were noted to be low. Recheck rectal temp was 100 and he was started on IV abx. Rapid covid negative.   Heart Pathway Score:     Past Medical History:  Diagnosis Date  . Atrial septal defect    Closed with surgery January, 2010  . Automatic implantable cardioverter-defibrillator in situ    LV dysfunction and pacer needed for AV node lesion  . Chronic combined systolic and diastolic CHF (congestive heart failure) ([place])   . Colon polyps   . COPD (chronic obstructive pulmonary disease) (HCC)    O2- 2 liters, nasal cannula, q night   .  CVA (cerebral vascular accident) ([place]) 2009   denies residual on 08/14/2013  . Diabetes mellitus without complication ([place])    type 2  . Dyslipidemia   . Endocarditis    Bacterial, 2009  . Hypertension   . Intracranial hemorrhage (HCC)    Coumadin cannot be used because of the history of his bleed  . Lung cancer ([place]) 11/29/2013   T1N0 Stage Ia non-small cell carcinoma left lung treated with wedge resection  . Myocardial infarction ([place]) 2010  . Pacemaker    combo pacer and icd  .  Permanent atrial fibrillation    Originally Coumadin use for atrial fibrillation  //   he had intracerebral hemorrhage with an INR of 2.3 June, 2009. Anticoagulation could no longer be used.  //  Rapid atrial fibrillation after inferior MI October, 2010..........Marland Kitchen AV node ablation done at that time with ICD pacemaker placed (EF 35%).   //   Left atrial appendage tied off at the time of mitral valve surgery January, 2010 (maze pro  . Pneumonia 07/2018  . Pulmonary hypertension (HCC)    Moderate  . Renal artery stenosis (HCC)    Mild by history  . 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 Spontaneous pneumothorax    right thoracotomy - distant past  . Status post minimally invasive mitral valve replacement with bioprosthetic valve    33 mm Medtronic Mosaic porcine bioprosthesis placed via right mini thoracotomy for bacterial endocarditis complicated by severe MR and CHF   . Thoracic aortic aneurysm ([place]) 08/11/2016   a - Chest CTA 1/18:  Aneurysmal dilatation of aortic root is noted at 5.1 cm.     Past Surgical History:  Procedure Laterality Date  . APPENDECTOMY    . ASD REPAIR, SECUNDUM  07/17/2008   pericardial patch closure of ASD  . AV NODE ABLATION  07/2008   for rapid atrial fib  . CARDIAC CATHETERIZATION    . CARDIAC DEFIBRILLATOR PLACEMENT  ~ [address] [place]  . CARDIAC VALVE REPLACEMENT    . CATARACT EXTRACTION W/ INTRAOCULAR LENS  IMPLANT, BILATERAL Bilateral   . ESOPHAGOGASTRODUODENOSCOPY (EGD) WITH PROPOFOL N/A 09/29/2016   Procedure: ESOPHAGOGASTRODUODENOSCOPY (EGD) WITH PROPOFOL;  Surgeon: [name], MD;  Location: [facility] ENDOSCOPY;  Service: Endoscopy;  Laterality: N/A;  . HERNIA REPAIR    . IMPLANTABLE CARDIOVERTER DEFIBRILLATOR (ICD) GENERATOR CHANGE N/A 02/06/2012   Procedure: ICD GENERATOR CHANGE;  Surgeon: [name], MD;  Location: [facility] CATH LAB;  Service: Cardiovascular;  Laterality: N/A;  . INSERT / REPLACE / REMOVE PACEMAKER    . MASS BIOPSY Left    neck mass  .  MITRAL VALVE REPLACEMENT Right 07/17/2008   34mm Medtronic Mosaic porcine bioprosthesis  . PENILE PROSTHESIS IMPLANT    . RIGHT HEART CATHETERIZATION N/A 08/16/2013   Procedure: RIGHT HEART CATH;  Surgeon: Josue Hector, MD;  Location: [facility] CATH LAB;  Service: Cardiovascular;  Laterality: N/A;  . TEE WITHOUT CARDIOVERSION N/A 09/06/2016   Procedure: TRANSESOPHAGEAL ECHOCARDIOGRAM (TEE);  Surgeon: Dorothy Spark, MD;  Location: [facility] ENDOSCOPY;  Service: Cardiovascular;  Laterality: N/A;  . THORACOTOMY Right 1970's   spontaneous pneumothorax - while in the [place]  . TOE SURGERY     left foot hammer toe  . TONSILLECTOMY    . VIDEO ASSISTED THORACOSCOPY (VATS)/WEDGE RESECTION Left 11/29/2013   Procedure: Video assisted thoracoscopy for wedge resection; mini thoracotomy;  Surgeon: [name], MD;  Location: [place];  Service: Thoracic;  Laterality: Left;  Home Medications:  Prior to Admission medications   Medication Sig Start Date End Date Taking? Authorizing Provider  acetaminophen (TYLENOL) 500 MG tablet Take 500-1,000 mg by mouth every 8 (eight) hours as needed for moderate pain or headache.    Yes [provider]  albuterol (PROVENTIL HFA) 108 (90 Base) MCG/ACT inhaler Inhale 2 puffs into the lungs every 6 (six) hours as needed for wheezing or shortness of breath.   Yes [provider]  aspirin EC 81 MG tablet Take 1 tablet (81 mg total) by mouth daily. 12/07/12  Yes Carlena Bjornstad, MD  atorvastatin (LIPITOR) 10 MG tablet Take 10 mg by mouth daily.   Yes [provider]  budesonide-formoterol (SYMBICORT) 160-4.5 MCG/ACT inhaler INHALE 2 PUFFS INTO THE LUNGS TWICE A DAY Patient taking differently: Inhale 2 puffs into the lungs 2 (two) times daily.  05/22/19  Yes Lauraine Rinne, NP  carvedilol (COREG) 3.125 MG tablet Take 3.125 mg by mouth 2 (two) times daily with a meal.   Yes [provider]  furosemide (LASIX) 40 MG tablet Take 1 tablet (40 mg total) by  mouth 2 (two) times daily. 05/02/19  Yes Nicole Kindred A, DO  metFORMIN (GLUCOPHAGE) 1000 MG tablet Take 1,000 mg by mouth 2 (two) times daily with a meal.   Yes [provider]  nitroGLYCERIN (NITROSTAT) 0.4 MG SL tablet Place 0.4 mg under the tongue every 5 (five) minutes x 3 doses as needed for chest pain.    Yes [provider]  OXYGEN Inhale 2-3 L/min into the lungs continuous.    Yes [provider]    Inpatient Medications: Scheduled Meds: . [START ON 06/08/2019] aspirin EC  81 mg Oral Daily  . [START ON 06/08/2019] atorvastatin  10 mg Oral Daily  . carvedilol  3.125 mg Oral BID WC  . furosemide  40 mg Intravenous Q12H  . insulin aspart  0-15 Units Subcutaneous TID WC  . mometasone-formoterol  2 puff Inhalation BID  . sodium chloride flush  3 mL Intravenous Q12H   Continuous Infusions: . sodium chloride     PRN Meds: sodium chloride, acetaminophen, albuterol, ondansetron (ZOFRAN) IV, sodium chloride flush  Allergies:    Allergies  Allergen Reactions  . Anticoagulant Compound Other (See Comments)    Pt had intracranial bleed, therefore all anticoagulation is contraindicated per Dr. Ron Parker  . Other Other (See Comments)    Per Dr. Halford Chessman (Surgeon): stated that the patient cannot be put under for any surgery, as he has an enlarged aorta. He would stand only a 50/50 chance of surviving. He has lung issues, diminished lung tissue, COPD, and emphysema.  . Warfarin Sodium Other (See Comments)    Pt had intracranial bleed, therefore all anticoagulation is contraindicated per Dr. Ron Parker    Social History:   Social History   Socioeconomic History  . Marital status: Married    Spouse name: [name]  . Number of children: 0  . Years of education: College  . Highest education level: Not on file  Occupational History  . 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 Financial resource strain: Not on file  .  Food insecurity    Worry: Not on file    Inability: Not on file  . Transportation needs    Medical: Not on file    Non-medical: Not on file  Tobacco Use  . Smoking status: Former Smoker  Packs/day: 1.00    Years: 45.00    Pack years: 45.00    Types: Cigarettes    Quit date: 2011    Years since quitting: 9.9  . Smokeless tobacco: Never Used  Substance and Sexual Activity  . Alcohol use: No    Alcohol/week: 0.0 standard drinks    Comment: 08/14/2013 "used to drink beer; quit:in 1982"  . Drug use: No  . Sexual activity: Yes  Lifestyle  . Physical activity    Days per week: Not on file    Minutes per session: Not on file  . Stress: Not on file  Relationships  . Social Herbalist on phone: Not on file    Gets together: Not on file    Attends religious service: Not on file    Active member of club or organization: Not on file    Attends meetings of clubs or organizations: Not on file    Relationship status: Not on file  . Intimate partner violence    Fear of current or ex partner: Not on file    Emotionally abused: Not on file    Physically abused: Not on file    Forced sexual activity: Not on file  Other Topics Concern  . Not on file  Social History Narrative   Patient lives at home with his spouse.   Caffeine Use: none   Worked for the post [employer]   Has two boys and a girl. All live local.     Family History:   Family History  Problem Relation Age of Onset  . Stomach cancer Father   . Stroke Mother      ROS:  Please see the history of present illness.  All other ROS reviewed and negative.     Physical Exam/Data:   Vitals:   06/07/19 1415 06/07/19 1430 06/07/19 1445 06/07/19 1500  BP: 119/69 105/64 (!) 107/58 106/62  Pulse: 73 73 74 77  Resp: (!) 28 (!) 28 (!) 28 (!) 26  Temp:      TempSrc:      SpO2: 94% 94% 94% 99%  Weight:      Height:        Intake/Output Summary (Last 24 hours) at 06/07/2019 1522 Last data filed at 06/07/2019 1519  Gross per 24 hour  Intake -  Output 1950 ml  Net -1950 ml   Last 3 Weights 06/07/2019 05/23/2019 05/21/2019  Weight (lbs) 152 lb 1.9 oz 152 lb 155 lb  Weight (kg) 69 kg 68.947 kg 70.308 kg     Body mass index is 20.07 kg/m.  General:  Well nourished, well developed, in no acute distress HEENT: normal Lymph: no adenopathy Neck: no JVD Endocrine:  No thryomegaly Vascular: No carotid bruits; FA pulses 2+ bilaterally without bruits  Cardiac:  normal S1, S2; Reg Irreg Lungs: diffusely diminished breath sounds; +crackles at bases, no wheezing; On 15L FM O2 Abd: soft, nontender, no hepatomegaly  Ext: no edema Musculoskeletal:  No deformities, BUE and BLE strength normal and equal Skin: warm and dry  Neuro:  CNs 2-12 intact, no focal abnormalities noted Psych:  Normal affect   EKG:  The EKG was personally reviewed and demonstrates:  Aflutter, 4:1 block, 70 bpm, old RBBB, Rad, poor R wave progression Telemetry:  Telemetry was personally reviewed and demonstrates:  Aflutter 4:1 AV block, ; rare PVC; 3 beats NSVT; HR 70-80s  Relevant CV Studies:  Echo 04/28/19  1. Left ventricular ejection fraction, by visual estimation,  is 25 to 30%. The left ventricle has moderate to severely decreased function. Mildly increased left ventricular size. There is mildly increased left ventricular hypertrophy.  2. Multiple segmental abnormalities exist. See findings.  3. Left ventricular diastolic Doppler parameters are indeterminate pattern of LV diastolic filling.  4. Global right ventricle has moderately reduced systolic function.The right ventricular size is normal. No increase in right ventricular wall thickness.  5. Left atrial size was severely dilated.  6. Right atrial size was severely dilated.  7. Mild aortic valve annular calcification.  8. The mitral valve has been repaired/replaced. There is a 33 mm Medtronic Mosiac prosthesis in position. Trace mitral valve regurgitation. Mean gradient of 5  mmHg suggest mildly stenotic flow.  9. The tricuspid valve is grossly normal. Tricuspid valve regurgitation is mild. 10. The aortic valve is tricuspid Aortic valve regurgitation is trivial by color flow Doppler. 11. The pulmonic valve was grossly normal. Pulmonic valve regurgitation is not visualized by color flow Doppler. 12. The aortic root was not well visualized. 13. Proximal aorta and arch are not well defined. The aortic root appears dilated. CTA would define course better if clinically indicated. 14. Moderately elevated pulmonary artery systolic pressure. 15. A device wire is visualized. 16. The inferior vena cava is dilated in size with <50% respiratory variability, suggesting right atrial pressure of 15 mmHg. 17. The tricuspid regurgitant velocity is 3.19 m/s, and with an assumed right atrial pressure of 15 mmHg, the estimated right ventricular systolic pressure is moderately elevated at 55.7 mmHg.  Laboratory Data:  High Sensitivity Troponin:   Recent Labs  Lab 06/07/19 1007 06/07/19 1159  TROPONINIHS 44* 72*     Chemistry Recent Labs  Lab 06/07/19 1007  NA 139  K 4.1  CL 103  CO2 24  GLUCOSE 226*  BUN 28*  CREATININE 1.29*  CALCIUM 8.6*  GFRNONAA 55*  GFRAA >60  ANIONGAP 12    Recent Labs  Lab 06/07/19 1007  PROT 6.0*  ALBUMIN 3.1*  AST 66*  ALT 53*  ALKPHOS 56  BILITOT 2.3*   Hematology Recent Labs  Lab 06/07/19 1007  WBC 9.0  RBC 4.55  HGB 10.7*  HCT 37.9*  MCV 83.3  MCH 23.5*  MCHC 28.2*  RDW 22.0*  PLT 222   BNP Recent Labs  Lab 06/07/19 1007  BNP 1,011.1*    DDimer No results for input(s): DDIMER in the last 168 hours.   Radiology/Studies:  Xr Chest Portable  Result Date: 06/07/2019 CLINICAL DATA:  Shortness of breath, feeling bad for 2 days. EXAM: PORTABLE CHEST 1 VIEW COMPARISON:  04/27/2019 FINDINGS: Cardiomediastinal contours remain enlarged. Signs of right-sided dual lead pacer defibrillator are unchanged. Signs of  increased interstitial markings are increased from the prior exam. Postoperative changes in the left lung are again noted. Background pulmonary emphysema as before. Graded opacity at the right lung base suggests pleural effusion. No acute bone findings. IMPRESSION: 1. Worsening pulmonary edema. 2. Graded opacity at the right lung base suggests pleural effusion. 3. Background pulmonary emphysema. 4. Stable enlargement of cardiomediastinal contours. Electronically Signed   By: Zetta Bills M.D.   On: 06/07/2019 10:41    Assessment and Plan:   SOB/Acute on Chronic combined HF Patient presents with 2-3 days of progressive SOB. BNP elevated to >1,000. CXR significant for pulmonary edema, right pleural effusion. Rapid COVID negative. Admitted for IV diuresis. Echo in October with EF 70-62% with diastolic dysfunction. - home meds include Lasix 40 mg Bid, coreg 3.125 mg  BID. Patient says he hasn't been taking lasix at home.  - creatinine 1.29 (baseline around 1) - On IV lasix 40 mg BID - Volume status -1.9 L since admission - weight on admission 152 lbs.        11/10- 155 lbs       05/02/19-151 lbs - Patient is on FM 15 L. Does not appear to have significant lower leg edema.  - patient was previously on Valsartan>> was discontinued for hypotension. If BP allows can consider starting ARB with plan to switch to entresto - Echo ordered  - Patient is end stage CHF previously refused palliative care services - continue daily weights/strict I&Os  Rt pleural effusion - contributing to severe SOB - CTA chest ordered to evaluate  Fever - rectal temp 100 - blood cultures and lactate pending - abx for possible PNA  Elevated troponin/Nonobstructive CAD Patient complained of chest pain/tightness.  - Last reported cath was 2009 which revealed 30% distal LAD stenosis, otherwise normal coronaries.  - Hs troponin 44>72  - EKG with no new ischemic changes - trend troponin. Elevation likely from demand  ischemia - continue aspirin, BB, statin - Favor conservative management  Persistent afib s/p ablation / Aflutter - not a candidate for a/c due to [place] - has BiV/ICD - rate control with BB  Prior MV replacement - seen on echo 04/28/19 trace MR, mean gradient 5 mmHg  HTN/H/o orthostatic hypotension - Some soft Bps - continue BB  HLD - continue Lipitor  Large right sinus valsalva anueryms (34mm) - following with Dr. Roxy Manns no plans for intervention   COPD on chronic O2  DM2 - SSI per IM  For questions or updates, please contact [facility] Please consult [email or web address] for contact info under     Signed, Givanni Staron Ninfa Meeker, PA-C  06/07/2019 3:22 PM

## 2019-06-07 NOTE — ED Notes (Signed)
Patient did not tolerate 5lnc, increased to [place] and still had difficulty with sats.  Placed back on Nonrebreather, 02 increased to 92%

## 2019-06-07 NOTE — ED Provider Notes (Signed)
[facility] EMERGENCY DEPARTMENT Provider Note   CSN: [id number] Arrival date & time: 06/07/19  0935     History   Chief Complaint No chief complaint on file.   HPI [name] is a 73 y.o. male.     Patient with hx nicm, chf, afib, lung ca, presents via EMS with increased sob in the past 2-3 days. Symptoms gradual onset, severe, persistent, felt worse today. Normally uses home o2 3 liters, on his normal o2 was found by EMS to have sats ` 60%. EMS placed on o2 mask and transported. Patient with mid to left chest pain/tightness for past couple days. No pleuritic pain. No exertional pain. Symptoms constant, non radiating, without specific exacerbating or allev factors. No leg pain or swelling. Occasional non prod cough. No sore throat or other uri symptoms. No fever or chills. No known covid+ exposure. Compliant w home meds.   The history is provided by the patient and the EMS personnel. The history is limited by the condition of the patient.    Past Medical History:  Diagnosis Date   Atrial fibrillation ([place])    AV Node ablation January, 2010, for rapid atrial fib   Atrial septal defect    Closed with surgery January, 2010   Automatic implantable cardioverter-defibrillator in situ    LV dysfunction and pacer needed for AV node lesion   Cardiomyopathy    non-ischemic   CHF (congestive heart failure) (HCC)    Chronic combined systolic and diastolic CHF (congestive heart failure) (HCC)    Colon polyps    COPD (chronic obstructive pulmonary disease) (HCC)    O2- 2 liters, nasal cannula, q night    COPD GOLD II 01/11/2007   PFT's 09/25/13  FEV1  2.18 (61%) ratio 64 no change p B2 and DLC0  32% corrects to 68% - trial off advair and acei rec starting  08/23/2013      CVA (cerebral vascular accident) [facility]) 2009   denies residual on 08/14/2013   Diabetes mellitus without complication ([place])    type 2   Dyslipidemia    Dysrhythmia    Ejection fraction <  50%    Endocarditis    Bacterial, 2009   Headache(784.0)    related to stroke only   HLD (hyperlipidemia)    Hypertension    Intracranial hemorrhage (HCC)    Coumadin cannot be used because of the history of his bleed   Lung cancer ([place]) 11/29/2013   T1N0 Stage Ia non-small cell carcinoma left lung treated with wedge resection   Mitral regurgitation    Severe symptomatic primary MR due to bacterial endocarditis, treated w/ MVR // Echo 1/18 EF 40-45, diffuse HK, dilated aorta at 42 mm/aortic root 47 mm; linear echogenic structure in ascending aorta - suspect reverberation artifact / consider CT or TEE to rule out dissection flap, bioprosthetic MVR with mean gradient 3, severe LAE, low normal RVSF, severe RAE, moderate TR, PASP 42   Myocardial infarction [facility]) 2010   Pacemaker    combo pacer and icd   Permanent atrial fibrillation    Originally Coumadin use for atrial fibrillation  //   he had intracerebral hemorrhage with an INR of 2.3 June, 2009. Anticoagulation could no longer be used.  //  Rapid atrial fibrillation after inferior MI October, 2010..........Marland Kitchen AV node ablation done at that time with ICD pacemaker placed (EF 35%).   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   Atherosclerosis of aorta (HCC) 08/11/2016   Orthostatic hypotension 09/26/2015   Near syncope 09/24/2015   Syncope 09/24/2015   Hepatic cirrhosis ([place]) 09/25/2014   History of colonic polyps 09/25/2014   Serum total bilirubin elevated 06/30/2014   Follow-up examination, following unspecified surgery 01/27/2014   Lung cancer ([place]) 11/29/2013   Prosthetic valve dysfunction    Chronic combined systolic and diastolic CHF (congestive heart failure) ([place])    Diabetes ([place]) 11/04/2013   Myocardial infarction ([place]) 11/01/2013   Cardiomyopathy, nonischemic ([place]) 11/01/2013   Acute on chronic respiratory failure with hypoxia ([place]) 08/15/2013   H/O intracranial hemorrhage 08/13/2013   H/O endocarditis 08/13/2013   H/O: CVA (cerebrovascular accident) 08/13/2013   Community acquired pneumonia 08/13/2013   Chronic respiratory failure assoc with chf/ PAH 08/12/2013   H/O atrioventricular nodal ablation    S/P MVR (mitral valve replacement)    Hypertension    Dyslipidemia    Permanent atrial fibrillation    Ejection fraction < 50%    ICD (implantable cardioverter-defibrillator), biventricular, in situ    Renal artery stenosis ([place])    Pulmonary hypertension ([place])    Primary cancer of left upper lobe of lung ([place]) 04/20/2009   COLONIC POLYPS, ADENOMATOUS 03/22/2007   COPD GOLD II 01/11/2007   GERD 01/11/2007    Past  Surgical History:  Procedure Laterality Date   APPENDECTOMY     ASD REPAIR, SECUNDUM  07/17/2008   pericardial patch closure of ASD   AV NODE ABLATION  07/2008   for rapid atrial fib   CARDIAC CATHETERIZATION     CARDIAC DEFIBRILLATOR PLACEMENT  ~ 2010   [employer]   CARDIAC VALVE REPLACEMENT     CATARACT EXTRACTION W/ INTRAOCULAR LENS  IMPLANT, BILATERAL Bilateral    ESOPHAGOGASTRODUODENOSCOPY (EGD) WITH PROPOFOL N/A 09/29/2016   Procedure: ESOPHAGOGASTRODUODENOSCOPY (EGD) WITH PROPOFOL;  Surgeon: [name], MD;  Location: [facility] ENDOSCOPY;  Service: Endoscopy;  Laterality: N/A;   HERNIA REPAIR     IMPLANTABLE CARDIOVERTER DEFIBRILLATOR (ICD) GENERATOR CHANGE N/A 02/06/2012   Procedure: ICD GENERATOR CHANGE;  Surgeon: [name], MD;  Location: [facility] CATH LAB;  Service: Cardiovascular;  Laterality: N/A;   INSERT / REPLACE / REMOVE  PACEMAKER     MASS BIOPSY Left    neck mass   MITRAL VALVE REPLACEMENT Right 07/17/2008   22m Medtronic Mosaic porcine bioprosthesis   PENILE PROSTHESIS IMPLANT     RIGHT HEART CATHETERIZATION N/A 08/16/2013   Procedure: RIGHT HEART CATH;  Surgeon: PJosue Hector MD;  [facility];  Service: Cardiovascular;  Laterality: N/A;   TEE WITHOUT CARDIOVERSION N/A 09/06/2016   Procedure: TRANSESOPHAGEAL ECHOCARDIOGRAM (TEE);  Surgeon: KDorothy Spark MD;  [facility];  Service: Cardiovascular;  Laterality: N/A;   THORACOTOMY Right 1970's   spontaneous pneumothorax - while in [place]    left foot hammer toe   TONSILLECTOMY     VIDEO ASSISTED THORACOSCOPY (VATS)/WEDGE RESECTION Left 11/29/2013   Procedure: Video assisted thoracoscopy for wedge resection; mini thoracotomy;  Surgeon: CRexene Alberts MD;  Location: [facility]  Service: Thoracic;  Laterality: Left;        Home Medications    Prior to Admission medications   Medication Sig Start Date End Date Taking? Authorizing Provider  acetaminophen  (TYLENOL) 325 MG tablet Take 325-650 mg by mouth every 8 (eight) hours as needed for headache.    [provider]  albuterol (PROVENTIL HFA) 108 (90 Base) MCG/ACT inhaler Inhale 2 puffs into the lungs every 6 (six) hours as needed for wheezing or shortness of breath.    [provider]  aspirin EC 81 MG tablet Take 1 tablet (81 mg total) by mouth daily. 12/07/12   KCarlena Bjornstad MD  atorvastatin (LIPITOR) 10 MG tablet Take 10 mg by mouth daily.    [provider]  budesonide-formoterol (SYMBICORT) 160-4.5 MCG/ACT inhaler INHALE 2 PUFFS INTO THE LUNGS TWICE A DAY 05/22/19   MLauraine Rinne NP  carvedilol (COREG) 3.125 MG tablet Take 3.125 mg by mouth 2 (two) times daily with a meal.    [provider]  furosemide (LASIX) 40 MG tablet Take 1 tablet (40 mg total) by mouth 2 (two) times daily. 05/02/19   GEzekiel Slocumb DO  metFORMIN (GLUCOPHAGE) 1000 MG tablet Take 1,000 mg by mouth 2 (two) times daily with a meal.    [provider]  nitroGLYCERIN (NITROSTAT) 0.4 MG SL tablet Place 0.4 mg under the tongue every 5 (five) minutes x 3 doses as needed for chest pain.     [provider]  OXYGEN Inhale 2-3 L/min into the lungs continuous.     [provider]    Family History Family History  Problem Relation Age of Onset   Stomach cancer Father    Stroke Mother     Social History Social History   Tobacco Use   Smoking status: Former Smoker    Packs/day: 1.00    Years: 45.00    Pack years: 45.00    Types: Cigarettes    Quit date: 08/11/2013    Years since quitting: 5.8   Smokeless tobacco: Never Used   Tobacco comment: 08/14/2013 "quit smoking in 2009"  Substance Use Topics   Alcohol use: No    Alcohol/week: 0.0 standard drinks    Comment: 08/14/2013 "used to drink beer; quit:in 1982"   Drug use: No     Allergies   Anticoagulant compound, Other, and Warfarin sodium   Review of Systems Review of Systems    Constitutional: Negative for fever.  HENT: Negative for sore throat.   Eyes: Negative for redness.  Respiratory: Positive for cough and shortness of breath.  Cardiovascular: Positive for chest pain. Negative for leg swelling.  Gastrointestinal: Negative for abdominal pain, diarrhea and vomiting.  Endocrine: Negative for polyuria.  Genitourinary: Negative for dysuria and flank pain.  Musculoskeletal: Negative for back pain and neck pain.  Skin: Negative for rash.  Neurological: Negative for headaches.  Hematological: Does not bruise/bleed easily.  Psychiatric/Behavioral: Negative for confusion.     Physical Exam Updated Vital Signs Pulse 74    Resp (!) 24    Ht 1.854 m (6' 1" )    Wt 69 kg    SpO2 99%    BMI 20.07 kg/m   Physical Exam Vitals signs and nursing note reviewed.  Constitutional:      Appearance: Normal appearance. He is well-developed.  HENT:     Head: Atraumatic.     Nose: Nose normal.     Mouth/Throat:     Mouth: Mucous membranes are moist.     Pharynx: Oropharynx is clear.  Eyes:     General: No scleral icterus.    Conjunctiva/sclera: Conjunctivae normal.  Neck:     Musculoskeletal: Normal range of motion and neck supple. No neck rigidity.     Trachea: No tracheal deviation.  Cardiovascular:     Rate and Rhythm: Normal rate and regular rhythm.     Pulses: Normal pulses.     Heart sounds: Normal heart sounds. No murmur. No friction rub. No gallop.   Pulmonary:     Effort: Pulmonary effort is normal. No accessory muscle usage or respiratory distress.     Breath sounds: Normal breath sounds.  Abdominal:     General: Bowel sounds are normal. There is no distension.     Palpations: Abdomen is soft.     Tenderness: There is no abdominal tenderness. There is no guarding.  Genitourinary:    Comments: No cva tenderness. Musculoskeletal:        General: No swelling or tenderness.     Right lower leg: No edema.     Left lower leg: No edema.  Skin:     General: Skin is warm and dry.     Findings: No rash.  Neurological:     Mental Status: He is alert.     Comments: Alert, speech clear.   Psychiatric:        Mood and Affect: Mood normal.      ED Treatments / Results  Labs (all labs ordered are listed, but only abnormal results are displayed) Results for orders placed or performed during the hospital encounter of 06/07/19  CBC  Result Value Ref Range   WBC 9.0 4.0 - 10.5 K/uL   RBC 4.55 4.22 - 5.81 MIL/uL   Hemoglobin 10.7 (L) 13.0 - 17.0 g/dL   HCT 37.9 (L) 39.0 - 52.0 %   MCV 83.3 80.0 - 100.0 fL   MCH 23.5 (L) 26.0 - 34.0 pg   MCHC 28.2 (L) 30.0 - 36.0 g/dL   RDW 22.0 (H) 11.5 - 15.5 %   Platelets 222 150 - 400 K/uL   nRBC 0.0 0.0 - 0.2 %  CMET  Result Value Ref Range   Sodium 139 135 - 145 mmol/L   Potassium 4.1 3.5 - 5.1 mmol/L   Chloride 103 98 - 111 mmol/L   CO2 24 22 - 32 mmol/L   Glucose, Bld 226 (H) 70 - 99 mg/dL   BUN 28 (H) 8 - 23 mg/dL   Creatinine, Ser 1.29 (H) 0.61 - 1.24 mg/dL   Calcium 8.6 (L) 8.9 -  10.3 mg/dL   Total Protein 6.0 (L) 6.5 - 8.1 g/dL   Albumin 3.1 (L) 3.5 - 5.0 g/dL   AST 66 (H) 15 - 41 U/L   ALT 53 (H) 0 - 44 U/L   Alkaline Phosphatase 56 38 - 126 U/L   Total Bilirubin 2.3 (H) 0.3 - 1.2 mg/dL   GFR calc non Af Amer 55 (L) >60 mL/min   GFR calc Af Amer >60 >60 mL/min   Anion gap 12 5 - 15  Brain natriuretic peptide  Result Value Ref Range   B Natriuretic Peptide 1,011.1 (H) 0.0 - 100.0 pg/mL  POC SARS Coronavirus 2 Ag-ED - Nasal Swab (BD Veritor Kit)  Result Value Ref Range   SARS Coronavirus 2 Ag NEGATIVE NEGATIVE  Troponin I (High Sensitivity)  Result Value Ref Range   Troponin I (High Sensitivity) 44 (H) <18 ng/L   Xr Chest Portable  Result Date: 06/07/2019 CLINICAL DATA:  Shortness of breath, feeling bad for 2 days. EXAM: PORTABLE CHEST 1 VIEW COMPARISON:  04/27/2019 FINDINGS: Cardiomediastinal contours remain enlarged. Signs of right-sided dual lead pacer defibrillator  are unchanged. Signs of increased interstitial markings are increased from the prior exam. Postoperative changes in the left lung are again noted. Background pulmonary emphysema as before. Graded opacity at the right lung base suggests pleural effusion. No acute bone findings. IMPRESSION: 1. Worsening pulmonary edema. 2. Graded opacity at the right lung base suggests pleural effusion. 3. Background pulmonary emphysema. 4. Stable enlargement of cardiomediastinal contours. Electronically Signed   By: Zetta Bills M.D.   On: 06/07/2019 10:41    EKG EKG Interpretation  Date/Time:  Friday June 07 2019 09:52:50 EST Ventricular Rate:  74 PR Interval:    QRS Duration: 143 QT Interval:  412 QTC Calculation: 458 R Axis:   -110 Text Interpretation: Atrial flutter with ventricular escape complexes Nonspecific T wave abnormality Baseline wander Confirmed by Lajean Saver [id number]) on 06/07/2019 10:05:19 AM   Radiology Xr Chest Portable  Result Date: 06/07/2019 CLINICAL DATA:  Shortness of breath, feeling bad for 2 days. EXAM: PORTABLE CHEST 1 VIEW COMPARISON:  04/27/2019 FINDINGS: Cardiomediastinal contours remain enlarged. Signs of right-sided dual lead pacer defibrillator are unchanged. Signs of increased interstitial markings are increased from the prior exam. Postoperative changes in the left lung are again noted. Background pulmonary emphysema as before. Graded opacity at the right lung base suggests pleural effusion. No acute bone findings. IMPRESSION: 1. Worsening pulmonary edema. 2. Graded opacity at the right lung base suggests pleural effusion. 3. Background pulmonary emphysema. 4. Stable enlargement of cardiomediastinal contours. Electronically Signed   By: Zetta Bills M.D.   On: 06/07/2019 10:41    Procedures Procedures (including critical care time)  Medications Ordered in ED Medications - No data to display   Initial Impression / Assessment and Plan / ED Course  I have reviewed  the triage vital signs and the nursing notes.  Pertinent labs & imaging results that were available during my care of the patient were reviewed by me and considered in my medical decision making (see chart for details).  Iv ns. Continuous pulse ox and monitor. o2 mask. Stat labs, ecg, cxr.   Reviewed nursing notes and prior charts for additional history. Patient noted to be dnr/dni - pt confirms. Patient previously referred to palliative care team, patient does want medical tx/admission if need.   Labs reviewed/interpreted by me - covid neg. Wbc normal. Initial trop sl elev.   CXR reviewed/interpreted by me -  edema. Lasix iv.   [name] was evaluated in Emergency Department on 06/07/2019 for the symptoms described in the history of present illness. He was evaluated in the context of the global COVID-19 pandemic, which necessitated consideration that the patient might be at risk for infection with the SARS-CoV-2 virus that causes COVID-19. Institutional protocols and algorithms that pertain to the evaluation of patients at risk for COVID-19 are in a state of rapid change based on information released by regulatory bodies including the CDC and federal and state organizations. These policies and algorithms were followed during the patient's care in the ED.  Recheck, bp lower than prior, now hypotensive. Rectal temp added to workup. Lactate added. Given pulm edema/chf, and pt wishes for dni, will observe closely, recheck bp - repeat bp 120/84.   Rectal temp 100. Iv abx given.  Hospitalists consulted for admission.   CRITICAL CARE RE: acute on chronic respiratory failure, hypotension r/o sepsis. General weakness, protein cal malnutrition/severe.  Performed by: Mirna Mires Total critical care time: 110 minutes Critical care time was exclusive of separately billable procedures and treating other patients. Critical care was necessary to treat or prevent imminent or life-threatening  deterioration. Critical care was time spent personally by me on the following activities: development of treatment plan with patient and/or surrogate as well as nursing, discussions with consultants, evaluation of patient's response to treatment, examination of patient, obtaining history from patient or surrogate, ordering and performing treatments and interventions, ordering and review of laboratory studies, ordering and review of radiographic studies, pulse oximetry and re-evaluation of patient's condition.     Final Clinical Impressions(s) / ED Diagnoses   Final diagnoses:  None    ED Discharge Orders    None       Lajean Saver, MD 06/07/19 1227

## 2019-06-07 NOTE — Progress Notes (Signed)
  Echocardiogram 2D Echocardiogram has been performed.  [name] 06/07/2019, 2:55 PM

## 2019-06-07 NOTE — ED Notes (Signed)
ED TO INPATIENT HANDOFF REPORT  ED Nurse Name and Phone #: [place]ette [place] Name/Age/Gender [name] 73 y.o. male Room/Bed: 020C/020C  Code Status   Code Status: DNR  Home/SNF/Other Home Patient oriented to: self, place, time and situation Is this baseline? Yes   Triage Complete: Triage complete  Chief Complaint sob  Triage Note C/o feeling bad for two days, today became extremely shob, wears home O2, initial pulse ox readings 58%, then EMS placed non rebreather 100%   Saturations immediately fall when 02 turned off, while transferring to wall acess.   Allergies Allergies  Allergen Reactions  . Anticoagulant Compound Other (See Comments)    Pt had intracranial bleed, therefore all anticoagulation is contraindicated per Dr. Ron Parker  . Other Other (See Comments)    Per Dr. Halford Chessman (Surgeon): stated that the patient cannot be put under for any surgery, as he has an enlarged aorta. He would stand only a 50/50 chance of surviving. He has lung issues, diminished lung tissue, COPD, and emphysema.  . Warfarin Sodium Other (See Comments)    Pt had intracranial bleed, therefore all anticoagulation is contraindicated per Dr. Ron Parker    Level of Care/Admitting Diagnosis ED Disposition    ED Disposition Condition [place]: [place] [[id number]]  Level of Care: Progressive [102]  Admit to Progressive based on following criteria: CARDIOVASCULAR & THORACIC of moderate stability with acute coronary syndrome symptoms/low risk myocardial infarction/hypertensive urgency/arrhythmias/heart failure potentially compromising stability and stable post cardiovascular intervention patients.  Covid Evaluation: Confirmed COVID Negative  Diagnosis: Acute exacerbation of CHF (congestive heart failure) [facility]) [[id number]]  Admitting Physician: Karmen Bongo [[id number]]  Attending Physician: Karmen Bongo [[id number]]  Estimated length of stay: 3 - 4 days  Certification:: I certify this patient will need inpatient services for at least 2 midnights  PT Class (Do Not Modify): Inpatient [101]  PT Acc Code (Do Not Modify): Private [1]       B Medical/Surgery History Past Medical History:  Diagnosis Date  . Atrial septal defect    Closed with surgery January, 2010  . Automatic implantable cardioverter-defibrillator in situ    LV dysfunction and pacer needed for AV node lesion  . Chronic combined systolic and diastolic CHF (congestive heart failure) ([place])   . Colon polyps   . COPD (chronic obstructive pulmonary disease) (HCC)    O2- 2 liters, nasal cannula, q night   . CVA (cerebral vascular accident) ([place]) 2009   denies residual on 08/14/2013  . Diabetes mellitus without complication ([place])    type 2  . Dyslipidemia   . Endocarditis    Bacterial, 2009  . Hypertension   . Intracranial hemorrhage (HCC)    Coumadin cannot be used because of the history of his bleed  . Lung cancer ([place]) 11/29/2013   T1N0 Stage Ia non-small cell carcinoma left lung treated with wedge resection  . Myocardial infarction ([place]) 2010  . Pacemaker    combo pacer and icd  . Permanent atrial fibrillation    Originally Coumadin use for atrial fibrillation  //   he had intracerebral hemorrhage with an INR of 2.3 June, 2009. Anticoagulation could no longer be used.  //  Rapid atrial fibrillation after inferior MI October, 2010..........Marland Kitchen AV node ablation done at that time with ICD pacemaker placed (EF 35%).   //   Left atrial appendage tied off at the time of mitral valve surgery January, 2010 (maze pro  . Pneumonia  07/2018  . Pulmonary hypertension (HCC)    Moderate  . Renal artery stenosis (HCC)    Mild by history  . 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 Spontaneous pneumothorax    right thoracotomy - distant past  . Status post minimally invasive mitral valve replacement with bioprosthetic valve    33 mm Medtronic Mosaic porcine bioprosthesis placed via right  mini thoracotomy for bacterial endocarditis complicated by severe MR and CHF   . Thoracic aortic aneurysm ([place]) 08/11/2016   a - Chest CTA 1/18:  Aneurysmal dilatation of aortic root is noted at 5.1 cm.    Past Surgical History:  Procedure Laterality Date  . APPENDECTOMY    . ASD REPAIR, SECUNDUM  07/17/2008   pericardial patch closure of ASD  . AV NODE ABLATION  07/2008   for rapid atrial fib  . CARDIAC CATHETERIZATION    . CARDIAC DEFIBRILLATOR PLACEMENT  ~ [address] [employer]  . CARDIAC VALVE REPLACEMENT    . CATARACT EXTRACTION W/ INTRAOCULAR LENS  IMPLANT, BILATERAL Bilateral   . ESOPHAGOGASTRODUODENOSCOPY (EGD) WITH PROPOFOL N/A 09/29/2016   Procedure: ESOPHAGOGASTRODUODENOSCOPY (EGD) WITH PROPOFOL;  Surgeon: [name], MD;  Location: [facility] ENDOSCOPY;  Service: Endoscopy;  Laterality: N/A;  . HERNIA REPAIR    . IMPLANTABLE CARDIOVERTER DEFIBRILLATOR (ICD) GENERATOR CHANGE N/A 02/06/2012   Procedure: ICD GENERATOR CHANGE;  Surgeon: [name], MD;  Location: [facility] C[facility] LAB;  Service: Cardiovascular;  Laterality: N/A;  . INSERT / REPLACE / REMOVE PACEMAKER    . MASS BIOPSY Left    neck mass  . MITRAL VALVE REPLACEMENT Right 07/17/2008   75m Medtronic Mosaic porcine bioprosthesis  . PENILE PROSTHESIS IMPLANT    . RIGHT HEART CATHETERIZATION N/A 08/16/2013   Procedure: RIGHT HEART CATH;  Surgeon: PJosue Hector MD;  [facility];  Service: Cardiovascular;  Laterality: N/A;  . TEE WITHOUT CARDIOVERSION N/A 09/06/2016   Procedure: TRANSESOPHAGEAL ECHOCARDIOGRAM (TEE);  Surgeon: KDorothy Spark MD;  [facility];  Service: Cardiovascular;  Laterality: N/A;  . THORACOTOMY Right 1970's   [place]     left foot hammer toe  . TONSILLECTOMY    . VIDEO ASSISTED THORACOSCOPY (VATS)/WEDGE RESECTION Left 11/29/2013   Procedure: Video assisted thoracoscopy for wedge resection; mini thoracotomy;  Surgeon: CRexene Alberts MD;   [place]  Service: Thoracic;  Laterality: Left;     A IV Location/Drains/Wounds Patient Lines/Drains/Airways Status   Active Line/Drains/Airways    Name:   Placement date:   Placement time:   Site:   Days:   Peripheral IV 04/27/19 Left Antecubital   04/27/19    1440    Antecubital   41          Intake/Output Last 24 hours  Intake/Output Summary (Last 24 hours) at 06/07/2019 1805 Last data filed at 06/07/2019 1519 Gross per 24 hour  Intake -  Output 1950 ml  Net -1950 ml    Labs/Imaging Results for orders placed or performed during the hospital encounter of 06/07/19 (from the past 48 hour(s))  CBC     Status: Abnormal   Collection Time: 06/07/19 10:07 AM  Result Value Ref Range   WBC 9.0 4.0 - 10.5 K/uL   RBC 4.55 4.22 - 5.81 MIL/uL   Hemoglobin 10.7 (L) 13.0 - 17.0 g/dL   HCT 37.9 (L) 39.0 - 52.0 %   MCV 83.3 80.0 - 100.0 fL  MCH 23.5 (L) 26.0 - 34.0 pg   MCHC 28.2 (L) 30.0 - 36.0 g/dL   RDW 22.0 (H) 11.5 - 15.5 %   Platelets 222 150 - 400 K/uL   nRBC 0.0 0.0 - 0.2 %    Comment: Performed at [facility] [address] [place], [place] [address]  CMET     Status: Abnormal   Collection Time: 06/07/19 10:07 AM  Result Value Ref Range   Sodium 139 135 - 145 mmol/L   Potassium 4.1 3.5 - 5.1 mmol/L   Chloride 103 98 - 111 mmol/L   CO2 24 22 - 32 mmol/L   Glucose, Bld 226 (H) 70 - 99 mg/dL   BUN 28 (H) 8 - 23 mg/dL   Creatinine, Ser 1.29 (H) 0.61 - 1.24 mg/dL   Calcium 8.6 (L) 8.9 - 10.3 mg/dL   Total Protein 6.0 (L) 6.5 - 8.1 g/dL   Albumin 3.1 (L) 3.5 - 5.0 g/dL   AST 66 (H) 15 - 41 U/L   ALT 53 (H) 0 - 44 U/L   Alkaline Phosphatase 56 38 - 126 U/L   Total Bilirubin 2.3 (H) 0.3 - 1.2 mg/dL   GFR calc non Af Amer 55 (L) >60 mL/min   GFR calc Af Amer >60 >60 mL/min   Anion gap 12 5 - 15    Comment: Performed at [facility], [place] [address], [place], [place] [address]  Troponin I (High Sensitivity)     Status: Abnormal   Collection Time:  06/07/19 10:07 AM  Result Value Ref Range   Troponin I (High Sensitivity) 44 (H) <18 ng/L    Comment: (NOTE) Elevated high sensitivity troponin I (hsTnI) values and significant  changes across serial measurements may suggest ACS but many other  chronic and acute conditions are known to elevate hsTnI results.  Refer to the "Links" section for chest pain algorithms and additional  guidance. Performed at [facility], [place] [address], [place], [place] [address]   Brain natriuretic peptide     Status: Abnormal   Collection Time: 06/07/19 10:07 AM  Result Value Ref Range   B Natriuretic Peptide 1,011.1 (H) 0.0 - 100.0 pg/mL    Comment: Performed at [facility] [address], [place],  [address]  POC SARS Coronavirus 2 Ag-ED - Nasal Swab (BD Veritor Kit)     Status: None   Collection Time: 06/07/19 10:47 AM  Result Value Ref Range   SARS Coronavirus 2 Ag NEGATIVE NEGATIVE    Comment: (NOTE) SARS-CoV-2 antigen NOT DETECTED.  Negative results are presumptive.  Negative results do not preclude SARS-CoV-2 infection and should not be used as the sole basis for treatment or other patient management decisions, including infection  control decisions, particularly in the presence of clinical signs and  symptoms consistent with COVID-19, or in those who have been in contact with the virus.  Negative results must be combined with clinical observations, patient history, and epidemiological information. The expected result is Negative. Fact Sheet for Patients: PodPark.tn Fact Sheet for Healthcare Providers: GiftContent.is This test is not yet approved or cleared by the [place] FDA and  has been authorized for detection and/or diagnosis of SARS-CoV-2 by FDA under an Emergency Use Authorization (EUA).  This EUA will remain in effect (meaning this test can be used) for the duration of  the COVID-19 de claration  under Section 564(b)(1) of the Act, 21 U.S.C. section 360bbb-3(b)(1), unless the authorization  is terminated or revoked sooner.   Troponin I (High Sensitivity)     Status: Abnormal   Collection Time: 06/07/19 11:59 AM  Result Value Ref Range   Troponin I (High Sensitivity) 72 (H) <18 ng/L    Comment: RESULT CALLED TO, READ BACK BY AND VERIFIED WITH: [facility] RN @ [phone] 06/07/19 LEONARD,A (NOTE) Elevated high sensitivity troponin I (hsTnI) values and significant  changes across serial measurements may suggest ACS but many other  chronic and acute conditions are known to elevate hsTnI results.  Refer to the Links section for chest pain algorithms and additional  guidance. Performed at [facility], [place] [address] [place], [place] [address]   UA     Status: Abnormal   Collection Time: 06/07/19 12:31 PM  Result Value Ref Range   Color, Urine YELLOW YELLOW   APPearance CLOUDY (A) CLEAR   Specific Gravity, Urine 1.013 1.005 - 1.030   pH 5.0 5.0 - 8.0   Glucose, UA 150 (A) NEGATIVE mg/dL   Hgb urine dipstick SMALL (A) NEGATIVE   Bilirubin Urine NEGATIVE NEGATIVE   Ketones, ur NEGATIVE NEGATIVE mg/dL   Protein, ur NEGATIVE NEGATIVE mg/dL   Nitrite NEGATIVE NEGATIVE   Leukocytes,Ua LARGE (A) NEGATIVE   RBC / HPF 21-50 0 - 5 RBC/hpf   WBC, UA >50 (H) 0 - 5 WBC/hpf   Bacteria, UA RARE (A) NONE SEEN   Squamous Epithelial / LPF 0-5 0 - 5   WBC Clumps PRESENT     Comment: Performed at [facility], [place] [address] [place], [place] [address]  SARS Coronavirus 2 by RT PCR (hospital order, performed in [facility] hospital lab) Nasopharyngeal Nasopharyngeal Swab     Status: None   Collection Time: 06/07/19  3:20 PM   Specimen: Nasopharyngeal Swab  Result Value Ref Range   SARS Coronavirus 2 NEGATIVE NEGATIVE    Comment: (NOTE) SARS-CoV-2 target nucleic acids are NOT DETECTED. The SARS-CoV-2 RNA is generally detectable in upper and lower respiratory specimens during the  acute phase of infection. The lowest concentration of SARS-CoV-2 viral copies this assay can detect is 250 copies / mL. A negative result does not preclude SARS-CoV-2 infection and should not be used as the sole basis for treatment or other patient management decisions.  A negative result may occur with improper specimen collection / handling, submission of specimen other than nasopharyngeal swab, presence of viral mutation(s) within the areas targeted by this assay, and inadequate number of viral copies (<250 copies / mL). A negative result must be combined with clinical observations, patient history, and epidemiological information. Fact Sheet for Patients:   StrictlyIdeas.no Fact Sheet for Healthcare Providers: BankingDealers.co.za This test is not yet approved or cleared  by the [place] FDA and has been authorized for detection and/or diagnosis of SARS-CoV-2 by FDA under an Emergency Use Authorization (EUA).  This EUA will remain in effect (meaning this test can be used) for the duration of the COVID-19 declaration under Section 564(b)(1) of the Act, 21 U.S.C. section 360bbb-3(b)(1), unless the authorization is terminated or revoked sooner. Performed at [facility], [place] [address] [place], [place] [address]   CBG monitoring, ED     Status: Abnormal   Collection Time: 06/07/19  4:09 PM  Result Value Ref Range   Glucose-Capillary 207 (H) 70 - 99 mg/dL   Xr Chest Portable  Result Date: 06/07/2019 CLINICAL DATA:  Shortness of breath, feeling bad for 2 days. EXAM: PORTABLE CHEST  1 VIEW COMPARISON:  04/27/2019 FINDINGS: Cardiomediastinal contours remain enlarged. Signs of right-sided dual lead pacer defibrillator are unchanged. Signs of increased interstitial markings are increased from the prior exam. Postoperative changes in the left lung are again noted. Background pulmonary emphysema as before. Graded opacity at the right  lung base suggests pleural effusion. No acute bone findings. IMPRESSION: 1. Worsening pulmonary edema. 2. Graded opacity at the right lung base suggests pleural effusion. 3. Background pulmonary emphysema. 4. Stable enlargement of cardiomediastinal contours. Electronically Signed   By: Zetta Bills M.D.   On: 06/07/2019 10:41    Pending Labs Unresulted Labs (From admission, onward)    Start     Ordered   06/08/19 4097  Basic metabolic panel  Daily,   R     06/07/19 1321   06/08/19 0000  CBC WITH DIFFERENTIAL  Tomorrow morning,   R     06/07/19 1321   06/07/19 1223  Blood culture (routine x 2)  BLOOD CULTURE X 2,   STAT     06/07/19 1222          Vitals/Pain Today's Vitals   06/07/19 1700 06/07/19 1715 06/07/19 1745 06/07/19 1754  BP: 118/66 117/65 113/70 113/70  Pulse: 73 72 70 74  Resp: 19 19 (!) 21 20  Temp:      TempSrc:      SpO2: 99% 92% 99% 100%  Weight:      Height:      PainSc:        Isolation Precautions No active isolations  Medications Medications  aspirin EC tablet 81 mg (has no administration in time range)  atorvastatin (LIPITOR) tablet 10 mg (has no administration in time range)  carvedilol (COREG) tablet 3.125 mg (has no administration in time range)  albuterol (PROVENTIL) (2.5 MG/3ML) 0.083% nebulizer solution 3 mL (has no administration in time range)  mometasone-formoterol (DULERA) 200-5 MCG/ACT inhaler 2 puff (has no administration in time range)  sodium chloride flush (NS) 0.9 % injection 3 mL (has no administration in time range)  sodium chloride flush (NS) 0.9 % injection 3 mL (has no administration in time range)  0.9 %  sodium chloride infusion (has no administration in time range)  acetaminophen (TYLENOL) tablet 650 mg (has no administration in time range)  ondansetron (ZOFRAN) injection 4 mg (has no administration in time range)  insulin aspart (novoLOG) injection 0-15 Units (has no administration in time range)  furosemide (LASIX)  injection 40 mg (has no administration in time range)  albuterol (VENTOLIN HFA) 108 (90 Base) MCG/ACT inhaler 4 puff (4 puffs Inhalation Given 06/07/19 1100)  ipratropium (ATROVENT HFA) inhaler 2 puff (2 puffs Inhalation Given 06/07/19 1100)  furosemide (LASIX) injection 60 mg (60 mg Intravenous Given 06/07/19 1214)    Mobility walks Low fall risk   Focused Assessments Cardiac Assessment Handoff:  Cardiac Rhythm: Normal sinus rhythm Lab Results  Component Value Date   CKTOTAL 34 06/08/2008   CKMB 1.0 06/08/2008   TROPONINI <0.03 07/31/2018   Lab Results  Component Value Date   DDIMER 1.55 (H) 07/31/2018   Does the Patient currently have chest pain? No     R Recommendations: See Admitting Provider Note  Report given to:   Additional Notes: N/A

## 2019-06-08 ENCOUNTER — Other Ambulatory Visit: Payer: Self-pay

## 2019-06-08 DIAGNOSIS — I482 Chronic atrial fibrillation, unspecified: Secondary | ICD-10-CM

## 2019-06-08 DIAGNOSIS — I5023 Acute on chronic systolic (congestive) heart failure: Secondary | ICD-10-CM

## 2019-06-08 DIAGNOSIS — I428 Other cardiomyopathies: Secondary | ICD-10-CM

## 2019-06-08 DIAGNOSIS — R778 Other specified abnormalities of plasma proteins: Secondary | ICD-10-CM

## 2019-06-08 LAB — CBC WITH DIFFERENTIAL/PLATELET
Abs Immature Granulocytes: 0.03 10*3/uL (ref 0.00–0.07)
Basophils Absolute: 0 10*3/uL (ref 0.0–0.1)
Basophils Relative: 0 %
Eosinophils Absolute: 0 10*3/uL (ref 0.0–0.5)
Eosinophils Relative: 0 %
HCT: 38.8 % — ABNORMAL LOW (ref 39.0–52.0)
Hemoglobin: 11.4 g/dL — ABNORMAL LOW (ref 13.0–17.0)
Immature Granulocytes: 0 %
Lymphocytes Relative: 6 %
Lymphs Abs: 0.4 10*3/uL — ABNORMAL LOW (ref 0.7–4.0)
MCH: 23.9 pg — ABNORMAL LOW (ref 26.0–34.0)
MCHC: 29.4 g/dL — ABNORMAL LOW (ref 30.0–36.0)
MCV: 81.3 fL (ref 80.0–100.0)
Monocytes Absolute: 0.7 10*3/uL (ref 0.1–1.0)
Monocytes Relative: 9 %
Neutro Abs: 6.1 10*3/uL (ref 1.7–7.7)
Neutrophils Relative %: 85 %
Platelets: 221 10*3/uL (ref 150–400)
RBC: 4.77 MIL/uL (ref 4.22–5.81)
RDW: 21.9 % — ABNORMAL HIGH (ref 11.5–15.5)
WBC: 7.2 10*3/uL (ref 4.0–10.5)
nRBC: 0 % (ref 0.0–0.2)

## 2019-06-08 LAB — BASIC METABOLIC PANEL
Anion gap: 13 (ref 5–15)
BUN: 28 mg/dL — ABNORMAL HIGH (ref 8–23)
CO2: 27 mmol/L (ref 22–32)
Calcium: 8.7 mg/dL — ABNORMAL LOW (ref 8.9–10.3)
Chloride: 100 mmol/L (ref 98–111)
Creatinine, Ser: 1.18 mg/dL (ref 0.61–1.24)
GFR calc Af Amer: >60 mL/min (ref >60)
GFR calc non Af Amer: >60 mL/min (ref >60)
Glucose, Bld: 222 mg/dL — ABNORMAL HIGH (ref 70–99)
Potassium: 4.3 mmol/L (ref 3.5–5.1)
Sodium: 140 mmol/L (ref 135–145)

## 2019-06-08 LAB — GLUCOSE, CAPILLARY
Glucose-Capillary: 186 mg/dL — ABNORMAL HIGH (ref 70–99)
Glucose-Capillary: 204 mg/dL — ABNORMAL HIGH (ref 70–99)
Glucose-Capillary: 134 mg/dL — ABNORMAL HIGH (ref 70–99)
Glucose-Capillary: 184 mg/dL — ABNORMAL HIGH (ref 70–99)

## 2019-06-08 MED ORDER — METOPROLOL SUCCINATE ER 25 MG PO TB24
12.5000 mg | ORAL_TABLET | Freq: Every day | ORAL | Status: DC
  Administered 2019-06-08 – 2019-06-10 (×3): 12.5 mg via ORAL
  Filled 2019-06-08 (×3): qty 1

## 2019-06-08 NOTE — Progress Notes (Signed)
Nurse paged Barrett PA regarding pt's vitals prior to metoprolol 24 hr tablet 12.5mg  administration. Vitals: BP 97/60 HR 73.  Medication was administered and vitals will be monitored.  Medication parameters were updated.

## 2019-06-08 NOTE — Evaluation (Signed)
Physical Therapy Evaluation Patient Details Name: [name] MRN: [id number] DOB: June 18, 1946 Today's Date: 06/08/2019   History of Present Illness  Patient with hx nicm, chf, afib, lung ca, AICD, mitral valve replacement, COPD, CVA, HTN, DM2 presents via EMS with increased sob in the past 2-3 days. Symptoms gradual onset, severe, persistent, felt worse today. Normally uses home o2 3 liters. Found to have acute on chronic respiratory failure with hypoxia  Clinical Impression  Pt admitted with above diagnosis and presents to PT with functional limitations due to deficits listed below (See PT problem list). Pt needs skilled PT to maximize independence and safety to allow discharge to home with possible HHPT.      Follow Up Recommendations Home health PT;Supervision - Intermittent    Equipment Recommendations  None recommended by PT    Recommendations for Other Services       Precautions / Restrictions Precautions Precautions: Fall Restrictions Weight Bearing Restrictions: No      Mobility  Bed Mobility               General bed mobility comments: Pt sitting EOB  Transfers Overall transfer level: Needs assistance Equipment used: None Transfers: Sit to/from Stand Sit to Stand: Min guard         General transfer comment: Assist for safety and lines  Ambulation/Gait Ambulation/Gait assistance: Min guard;Min assist Gait Distance (Feet): 75 Feet Assistive device: None Gait Pattern/deviations: Step-through pattern;Decreased stride length Gait velocity: decr Gait velocity interpretation: <1.31 ft/sec, indicative of household ambulator General Gait Details: Pt with unsteady gait requiring assist for balance as pt fatigued. Amb on 6L with SpO2 85% after amb  Stairs            Wheelchair Mobility    Modified Rankin (Stroke Patients Only)       Balance Overall balance assessment: Mild deficits observed, not formally tested                                           Pertinent Vitals/Pain Pain Assessment: No/denies pain    Home Living Family/patient expects to be discharged to:: Private residence Living Arrangements: Spouse/significant other Available Help at Discharge: Family Type of Home: House Home Access: Stairs to enter Entrance Stairs-Rails: Can reach both Entrance Stairs-Number of Steps: 3 Home Layout: One level Home Equipment: None Additional Comments: oxygen concentrator (3 liters at home)    Prior Function Level of Independence: Independent         Comments: pt ambulates around household and backyard without AD     Hand Dominance   Dominant Hand: Left    Extremity/Trunk Assessment   Upper Extremity Assessment Upper Extremity Assessment: Defer to OT evaluation    Lower Extremity Assessment Lower Extremity Assessment: Generalized weakness       Communication   Communication: HOH  Cognition Arousal/Alertness: Awake/alert Behavior During Therapy: WFL for tasks assessed/performed Overall Cognitive Status: Within Functional Limits for tasks assessed                                        General Comments      Exercises     Assessment/Plan    PT Assessment Patient needs continued PT services  PT Problem List Decreased strength;Decreased activity tolerance;Decreased balance;Decreased mobility  PT Treatment Interventions DME instruction;Gait training;Functional mobility training;Therapeutic activities;Therapeutic exercise;Balance training;Patient/family education    PT Goals (Current goals can be found in the Care Plan section)  Acute Rehab PT Goals Patient Stated Goal: to go home PT Goal Formulation: With patient Time For Goal Achievement: 06/22/19 Potential to Achieve Goals: Good    Frequency Min 3X/week   Barriers to discharge        Co-evaluation               AM-PAC PT "6 Clicks" Mobility  Outcome Measure Help needed turning from your  back to your side while in a flat bed without using bedrails?: None Help needed moving from lying on your back to sitting on the side of a flat bed without using bedrails?: None Help needed moving to and from a bed to a chair (including a wheelchair)?: A Little Help needed standing up from a chair using your arms (e.g., wheelchair or bedside chair)?: None Help needed to walk in hospital room?: A Little Help needed climbing 3-5 steps with a railing? : A Little 6 Click Score: 21    End of Session Equipment Utilized During Treatment: Oxygen Activity Tolerance: Patient limited by fatigue Patient left: in bed;with call bell/phone within reach   PT Visit Diagnosis: Unsteadiness on feet (R26.81);Muscle weakness (generalized) (M62.81)    Time: 8280-0349 PT Time Calculation (min) (ACUTE ONLY): 11 min   Charges:   PT Evaluation $PT Eval Moderate Complexity: [place] Pager [phone] Office [place] 06/08/2019, 3:41 PM

## 2019-06-08 NOTE — Progress Notes (Addendum)
Progress Note  Patient Name: [name] Date of Encounter: 06/08/2019  Primary Cardiologist: Candee Furbish, MD   Subjective   Overall looks better this am.  He says breathing has not improved much but O2 requirements have decreased.  COVID neg.  Complains of pleuritic CP on left side.  He put out 2.75L overnight and is net neg 2.87L.  O2 sat 94% on HFNC at 5L  Inpatient Medications    Scheduled Meds: . aspirin EC  81 mg Oral Daily  . atorvastatin  10 mg Oral Daily  . carvedilol  3.125 mg Oral BID WC  . furosemide  40 mg Intravenous Q12H  . insulin aspart  0-15 Units Subcutaneous TID WC  . mometasone-formoterol  2 puff Inhalation BID  . sodium chloride flush  3 mL Intravenous Q12H   Continuous Infusions: . sodium chloride     PRN Meds: sodium chloride, acetaminophen, albuterol, ondansetron (ZOFRAN) IV, sodium chloride flush   Vital Signs    Vitals:   06/08/19 0446 06/08/19 0600 06/08/19 0844 06/08/19 1009  BP: 103/64  109/71   Pulse: 74  75   Resp: 20     Temp: 98.9 F (37.2 C)     TempSrc: Oral     SpO2: 97% 94%    Weight:    70.8 kg  Height:        Intake/Output Summary (Last 24 hours) at 06/08/2019 1011 Last data filed at 06/08/2019 0900 Gross per 24 hour  Intake 480 ml  Output 3350 ml  Net -2870 ml   Filed Weights   06/07/19 0943 06/08/19 1009  Weight: 69 kg 70.8 kg    Telemetry    Atrial fibrillation - Personally Reviewed  ECG    Atrial fibrillation with V paced rhythm - Personally Reviewed  Physical Exam   GEN: No acute distress.  Cachectic appearing Neck: No JVD Cardiac: irregularly irregular, no murmurs, rubs, or gallops.  Respiratory: decreased BS at bases GI: Soft, nontender, non-distended  MS: No edema; No deformity. Neuro:  Nonfocal  Psych: Normal affect   Labs    Chemistry Recent Labs  Lab 06/07/19 1007 06/08/19 0452  NA 139 140  K 4.1 4.3  CL 103 100  CO2 24 27  GLUCOSE 226* 222*  BUN 28* 28*  CREATININE 1.29*  1.18  CALCIUM 8.6* 8.7*  PROT 6.0*  --   ALBUMIN 3.1*  --   AST 66*  --   ALT 53*  --   ALKPHOS 56  --   BILITOT 2.3*  --   GFRNONAA 55* >60  GFRAA >60 >60  ANIONGAP 12 13     Hematology Recent Labs  Lab 06/07/19 1007 06/07/19 2326  WBC 9.0 7.2  RBC 4.55 4.77  HGB 10.7* 11.4*  HCT 37.9* 38.8*  MCV 83.3 81.3  MCH 23.5* 23.9*  MCHC 28.2* 29.4*  RDW 22.0* 21.9*  PLT 222 221    Cardiac EnzymesNo results for input(s): TROPONINI in the last 168 hours. No results for input(s): TROPIPOC in the last 168 hours.   BNP Recent Labs  Lab 06/07/19 1007  BNP 1,011.1*     DDimer No results for input(s): DDIMER in the last 168 hours.   Radiology    Xr Chest Portable  Result Date: 06/07/2019 CLINICAL DATA:  Shortness of breath, feeling bad for 2 days. EXAM: PORTABLE CHEST 1 VIEW COMPARISON:  04/27/2019 FINDINGS: Cardiomediastinal contours remain enlarged. Signs of right-sided dual lead pacer defibrillator are unchanged. Signs of increased  interstitial markings are increased from the prior exam. Postoperative changes in the left lung are again noted. Background pulmonary emphysema as before. Graded opacity at the right lung base suggests pleural effusion. No acute bone findings. IMPRESSION: 1. Worsening pulmonary edema. 2. Graded opacity at the right lung base suggests pleural effusion. 3. Background pulmonary emphysema. 4. Stable enlargement of cardiomediastinal contours. Electronically Signed   By: Zetta Bills M.D.   On: 06/07/2019 10:41    Cardiac Studies   Echo 04/28/19 1. Left ventricular ejection fraction, by visual estimation, is 25 to 30%. The left ventricle has moderate to severely decreased function. Mildly increased left ventricular size. There is mildly increased left ventricular hypertrophy. 2. Multiple segmental abnormalities exist. See findings. 3. Left ventricular diastolic Doppler parameters are indeterminate pattern of LV diastolic filling. 4. Global right  ventricle has moderately reduced systolic function.The right ventricular size is normal. No increase in right ventricular wall thickness. 5. Left atrial size was severely dilated. 6. Right atrial size was severely dilated. 7. Mild aortic valve annular calcification. 8. The mitral valve has been repaired/replaced. There is a 33 mm Medtronic Mosiac prosthesis in position. Trace mitral valve regurgitation. Mean gradient of 5 mmHg suggest mildly stenotic flow. 9. The tricuspid valve is grossly normal. Tricuspid valve regurgitation is mild. 10. The aortic valve is tricuspid Aortic valve regurgitation is trivial by color flow Doppler. 11. The pulmonic valve was grossly normal. Pulmonic valve regurgitation is not visualized by color flow Doppler. 12. The aortic root was not well visualized. 13. Proximal aorta and arch are not well defined. The aortic root appears dilated. CTA would define course better if clinically indicated. 14. Moderately elevated pulmonary artery systolic pressure. 15. A device wire is visualized. 16. The inferior vena cava is dilated in size with <50% respiratory variability, suggesting right atrial pressure of 15 mmHg. 17. The tricuspid regurgitant velocity is 3.19 m/s, and with an assumed right atrial pressure of 15 mmHg, the estimated right ventricular systolic pressure is moderately elevated at 55.7 mmHg.  Patient Profile     73 y.o. male with a hx of MV endocarditis c/b severe MR s/p minimally invasive bioprosthetic MVR 2018, moderate pulmonary HTN, non-ischemic cardiomyopathy, aifb s/p nodal ablation (not able to on [facility] due to [place]),  s/p BI -V-ICD, lung cancer, sinus valsalva aneurysm, HTN, HLD, DM, CVA (2009), COPD on 3L [place] O2, chronic combined CHF with AICD placement who is being seen  for the evaluation of shortness of breath at the request of Dr. Lorin Mercy.  Assessment & Plan    SOB/Acute on Chronic combined HF -Patient presents with 2-3 days of progressive SOB.  -BNP  elevated to >1,000. CXR significant for pulmonary edema, right pleural effusion. -Rapid COVID negative.  -Echo in October with EF 54-27% with diastolic dysfunction. -currently on Lasix 40mg  IV BID -he put out 2.75L yesterday and is net neg 2.87L today -weight actually up 4lbs and likely inaccurate -- weight on admission 152 lbs.        11/10- 155 lbs       05/02/19-151 lbs -creatinine improved with diuresis 1.29>>1.18 (baseline around 1) -Continue IV lasix 40 mg BID at least one more day -patient was previously on Valsartan>> was discontinued for hypotension. -currently BP soft but if improves can consider starting ARB with plan to switch to entresto -Patient is end stage CHF previously refused palliative care services -stop carvedilol and change to B1 selective BB - Toprol XL 12.5mg  daily  Rt pleural effusion - contributing  to severe SOB  Fever - rectal temp 100 - blood cultures and lactate pending - abx for possible PNA  Elevated troponin/Nonobstructive CAD -Patient complained of chest pain/tightness.  -Last reported cath was 2009 which revealed 30% distal LAD stenosis, otherwise normal coronaries.  -he has known 55mm right SOV aneurysm impinging on RVOT by coronary CTA 12/2018 and not a surgical candidate for candidate for PCI in the future - he is DNR - Hs troponin 44>72  - EKG with no new ischemic changes - Elevation likely from demand ischemia - continue aspirin, BB, statin - Conservative management - consider revisiting Palliative Care Consult  Persistent afib s/p ablation / Aflutter - not a candidate for a/c due to [place] - has BiV/ICD - rate control with BB  Prior MV replacement - seen on echo 04/28/19 trace MR, mean gradient 5 mmHg  HTN/H/o orthostatic hypotension - Some soft Bps - continue BB  HLD - continue Lipitor  Large right sinus valsalva anueryms (71mm) - following with Dr. Roxy Manns no plans for intervention   COPD on chronic O2  DM2 - SSI  per IM  I have spent a total of 35 minutes with patient reviewing 2D echo , telemetry, EKGs, labs and examining patient as well as establishing an assessment and plan that was discussed with the patient.  > 50% of time was spent in direct patient care.       For questions or updates, please contact [place] Please consult [email or web address] for contact info under Cardiology/STEMI.      Signed, Fransico Him, MD  06/08/2019, 10:11 AM

## 2019-06-08 NOTE — Progress Notes (Signed)
  Pt denies dizziness or lightheadedness. Pt is currently lying in bed, call bell within reach. pt was instructed to call nurse immediately if he experience any dizziness or syncope feeling. Pt verbalizes understanding.    06/08/19 1304  Vitals  Temp 98.3 F (36.8 C)  Temp Source Oral  BP 94/63  MAP (mmHg) 74  BP Location Right Arm  BP Method Automatic  Patient Position (if appropriate) Sitting  Pulse Rate 73  Pulse Rate Source Monitor  Resp 18  Oxygen Therapy  SpO2 96 %  O2 Device Nasal Cannula  O2 Flow Rate (L/min) 5 L/min  MEWS Score  MEWS RR 0  MEWS Pulse 0  MEWS Systolic 1  MEWS LOC 0  MEWS Temp 0  MEWS Score 1  MEWS Score Color [name]

## 2019-06-08 NOTE — Evaluation (Signed)
Occupational Therapy Evaluation Patient Details Name: [name] MRN: [id number] DOB: [birth year] Today's Date: 06/08/2019    History of Present Illness Patient with hx nicm, chf, afib, lung ca, AICD, mitral valve replacement, COPD, CVA, HTN, DM2 presents via EMS with increased sob in the past 2-3 days. Symptoms gradual onset, severe, persistent, felt worse today. Normally uses home o2 3 liters. Found to have acute on chronic respiratory failure with hypoxia   Clinical Impression   This 73 yo male admitted with above presents to acute OT with generalized deconditioning thus making him feel weak especially when up on his feet, as well as increased O2 requirements at rest and with mobility. He will benefit from acute OT with follow up [place] to get back to his PLOF which was independent.  Pts sats at rest on 5 liters HFNC    95% Pts sats upon returning from ambulation on 6 liters [place] with O2 tank 86% with up to 91% with rest and purse lipped breathing (pt reported he did not feel SOB) Left on 5 liters HFNC with sats 93% a rest in bed    Follow Up Recommendations  Home health OT;Supervision/Assistance - 24 hour    Equipment Recommendations  None recommended by OT       Precautions / Restrictions Precautions Precautions: Fall Precaution Comments: (P) monitor O2 sats Restrictions Weight Bearing Restrictions: No      Mobility Bed Mobility Overal bed mobility: Modified Independent             General bed mobility comments: HOB up  Transfers Overall transfer level: Needs assistance Equipment used: None Transfers: Sit to/from Stand Sit to Stand: Min guard         General transfer comment: ambulation in room and short distance down hall with minguard A on 6 liters of O2 and no AD (did not challenge balance)    Balance Overall balance assessment: Mild deficits observed, not formally tested                                         ADL either performed or  assessed with clinical judgement   ADL Overall ADL's : Needs assistance/impaired Eating/Feeding: Independent;Sitting   Grooming: Set up;Supervision/safety;Sitting   Upper Body Bathing: Supervision/ safety;Set up;Sitting   Lower Body Bathing: Min guard;Sit to/from stand   Upper Body Dressing : Set up;Supervision/safety;Sitting   Lower Body Dressing: Min guard;Sit to/from stand   Toilet Transfer: Min guard;Ambulation   Toileting- Clothing Manipulation and Hygiene: Min guard;Sit to/from stand         General ADL Comments: "I feel a little weak when I am up on my feet". We discussed that a shower chair would be a good idea since he is feeling weak, that insurance does not cover them so he would need to get one on his own.     Vision Baseline Vision/History: Wears glasses Wears Glasses: At all times Patient Visual Report: No change from baseline              Pertinent Vitals/Pain Pain Assessment: 0-10 Pain Score: 6  Pain Location: left mid chest intermittently when he takes a deep breath Pain Descriptors / Indicators: Sharp;Shooting(then dissipates) Pain Intervention(s): Monitored during session(occurred 2 times during OT session)     Hand Dominance Left   Extremity/Trunk Assessment Upper Extremity Assessment Upper Extremity Assessment: Overall WFL for tasks assessed  Communication Communication Communication: HOH   Cognition Arousal/Alertness: Awake/alert Behavior During Therapy: WFL for tasks assessed/performed Overall Cognitive Status: Within Functional Limits for tasks assessed                                                Home Living Family/patient expects to be discharged to:: Private residence Living Arrangements: Spouse/significant other Available Help at Discharge: Family Type of Home: House Home Access: Stairs to enter Technical brewer of Steps: 3 Entrance Stairs-Rails: Can reach both Home Layout: One level      Bathroom Shower/Tub: Teacher, early years/pre: Standard     Home Equipment: None   Additional Comments: oxygen concentrator (3 liters at home)      Prior Functioning/Environment Level of Independence: Independent        Comments: pt ambulates around household and backyard without AD        OT Problem List: Impaired balance (sitting and/or standing);Cardiopulmonary status limiting activity      OT Treatment/Interventions: Patient/family education;Balance training;Energy conservation    OT Goals(Current goals can be found in the care plan section) Acute Rehab OT Goals Patient Stated Goal: to go home OT Goal Formulation: With patient Time For Goal Achievement: 06/22/19 Potential to Achieve Goals: Good  OT Frequency: Min 2X/week              AM-PAC OT "6 Clicks" Daily Activity     Outcome Measure Help from another person eating meals?: None Help from another person taking care of personal grooming?: A Little Help from another person toileting, which includes using toliet, bedpan, or urinal?: A Little Help from another person bathing (including washing, rinsing, drying)?: A Little Help from another person to put on and taking off regular upper body clothing?: A Little Help from another person to put on and taking off regular lower body clothing?: A Little 6 Click Score: 19   End of Session Equipment Utilized During Treatment: Gait belt Nurse Communication: Mobility status(O2 dropped to 86 on 6 liters of O2 while ambulating)  Activity Tolerance: Patient tolerated treatment well Patient left: in bed;with call bell/phone within reach;with bed alarm set  OT Visit Diagnosis: Unsteadiness on feet (R26.81);Muscle weakness (generalized) (M62.81)                Time: 0822-0910 OT Time Calculation (min): 48 min Charges:  OT Evaluation $OT Eval Moderate Complexity: 1 Mod OT Treatments $Self Care/Home Management : 23-37 mins  [place], OTR/L Acute [employer] Pager [phone] Office [phone]    Almon Register 06/08/2019, 9:31 AM

## 2019-06-08 NOTE — Progress Notes (Signed)
PROGRESS NOTE  [name] YDX:[id number] DOB: 1945-10-23 DOA: 06/07/2019 PCP: [name], NP  Brief History   The patient is a 73 yr old man who is on 3L of O2 at home at baseline. He presents to [facility] ED on 06/07/2019 with complaints of dyspnea x 2 days. In the ED he was found to have acute on chronic respiratory failure due to CHF exacerbation in setting of moderate pulmonary hypertension and COPD. BNP was elevated as was troponin. Troponin trended up from 44 to 72.   [facility] were consulted to admit the patient for further evaluation and treatment. Cardiology was consulted. He was admitted to a telemetry bed. Echocardiogram was obtained that demonstrated LVEF of 25-30% with severely reduced LV function and regional wall motion abnormalities. There is also apical akinesis. 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 Stable from recent CTA. Bioprosthetic mitral valve is present. Gradients stable.   The patient will be diuresed with lasix 40 mg IV bid. The patient is end stage CHF. He has refused palliative care services. CTA chest has been ordered by cardiology to investigate Rt pleural effusion.  Consultants  . Cardiology  Procedures  . None  Antibiotics   Anti-infectives (From admission, onward)   Start     Dose/Rate Route Frequency Ordered Stop   06/07/19 1230  cefTRIAXone (ROCEPHIN) 2 g in sodium chloride 0.9 % 100 mL IVPB  Status:  Discontinued     2 g 200 mL/hr over 30 Minutes Intravenous Every 24 hours 06/07/19 1223 06/07/19 1321   06/07/19 1230  azithromycin (ZITHROMAX) 500 mg in sodium chloride 0.9 % 250 mL IVPB  Status:  Discontinued     500 mg 250 mL/hr over 60 Minutes Intravenous Every 24 hours 06/07/19 1223 06/07/19 1321    .   Subjective  The patient is resting. He is complaining of left sided pleuritic chest pain.  Objective   Vitals:  Vitals:   06/08/19 1202 06/08/19 1304  BP: 97/60 94/63  Pulse: 73 73  Resp:  18 18  Temp: 98.6 F (37 C) 98.3 F (36.8 C)  SpO2: 96% 96%   Exam:  Constitutional:  . The patient is awake, alert, and oriented x 3. Mild distress from pleuritic chest pain. Respiratory:  Marland Kitchen Mildly increased work of breathing especially with conversation. . No wheezes or rhonchi . Positive for scattered rales. . No tactile fremitus Cardiovascular:  . Regular rate and rhythm . No murmurs, ectopy, or gallups. . No lateral PMI. No thrills. Abdomen:  . Abdomen is soft, non-tender, non-distended . No hernias, masses, or organomegaly . Normoactive bowel sounds.  Musculoskeletal:  . No cyanosis, clubbing, or edema Skin:  . No rashes, lesions, ulcers . palpation of skin: no induration or nodules Neurologic:  . CN 2-12 intact . Sensation all 4 extremities intact Psychiatric:  . Mental status o Mood, affect appropriate o Orientation to person, place, time  . judgment and insight appear intact  I have personally reviewed the following:   Today's Data  . Vitals, BMP, CBC, BNP, Troponin I  Micro Data  . Blood cultures x 2: No growth Scheduled Meds: . aspirin EC  81 mg Oral Daily  . atorvastatin  10 mg Oral Daily  . furosemide  40 mg Intravenous Q12H  . insulin aspart  0-15 Units Subcutaneous TID WC  . metoprolol succinate  12.5 mg Oral Daily  . mometasone-formoterol  2 puff Inhalation BID  . sodium chloride flush  3 mL Intravenous Q12H   Continuous Infusions: . sodium chloride      Principal Problem:   Acute on chronic respiratory failure with hypoxia (HCC) Active Problems:   COPD GOLD II   Hypertension   Dyslipidemia   Diabetes ([place])   Lung cancer (HCC)   Acute on chronic combined systolic (congestive) and diastolic (congestive) heart failure (HCC)   Elevated troponin   Chronic atrial fibrillation (HCC)   LOS: 1 day   A & P   Acute on chronic respiratory failure associated with decompensated severe systolic CHF:  Echocardiogram performed on 06/07/2019  demonstrates LVEF of 25-30% with severely reduced LV function and regional wall motion abnormalities. There is also apical akinesis. 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 Stable from recent CTA. Bioprosthetic mitral valve is present. Gradients stable. CXR consistent with pulmonary edema that is severe and requires new O2 therapy. He will likely also require prn IBPAP. No real evidence of pneumonia as a cause. Antibiotics given in ED not continued. COVID negative. Cardiology has been consulted. ACE I held for now due to acute exacerbation and elevated creatinine. The patient will be continued on ASA, and Coreg. He will be diuresed with lasix 40 mg IV bid. No anticoagulation due to history of intracranial hemorrhage history.   Essential hypertension: The patient blood pressures are on the low side with Coreg, Imdur and lasix. Monitor. Blood pressures may limit ability to diurese.  Hyperlipidemia: Continue Lipitor as prior to admission. Lipids were checked on 04/12/19 (TC 85, HDL 45, LDL 20, TG 95) so will not repeat at this time.  DM II: Last A1c was 7.5 on 04/12/19. Glucophage has been held. Will follow glucoses with FSBS and SSI. Glucoses over the past 24 hours have been 186 - 207.   COPD: No exacerbation is evident currently. As needed nebulizer treatments available. Monitor.  Lung cancer: Stage 1A NSCLC, well-differentiated adenocarcinoma in 08/2013, s/p wedge resection. Observation of now. Due for CT to further evaluate mediastinal LAD in January.  Goals of care: Has previously refused palliative care services. He is a DNR, but entire scope of treatment is desired. His daughter seemed very concerned, "Is my daddy going to die?"   Note: This patient has been tested and is negative for the novel coronavirus COVID-19 by both BD and Cepheid testing.  I have seen and examined this patient myself. I have spent 35 minutes in his evaluation and care.  DVT  prophylaxis: SCDs due to h/o ICH on Coumadin Code Status:  DNR - confirmed with patient Family Communication: None present. Disposition Plan:  Home once clinically improved  Sinia Antosh, DO [facility] Direct contact: see [email or web address]  7PM-7AM contact night coverage as above 06/08/2019, 2:45 PM  LOS: 1 day

## 2019-06-09 LAB — CBC WITH DIFFERENTIAL/PLATELET
Abs Immature Granulocytes: 0.04 10*3/uL (ref 0.00–0.07)
Basophils Absolute: 0.1 10*3/uL (ref 0.0–0.1)
Basophils Relative: 1 %
Eosinophils Absolute: 0.2 10*3/uL (ref 0.0–0.5)
Eosinophils Relative: 2 %
HCT: 34.4 % — ABNORMAL LOW (ref 39.0–52.0)
Hemoglobin: 10.2 g/dL — ABNORMAL LOW (ref 13.0–17.0)
Immature Granulocytes: 1 %
Lymphocytes Relative: 10 %
Lymphs Abs: 0.8 10*3/uL (ref 0.7–4.0)
MCH: 23.9 pg — ABNORMAL LOW (ref 26.0–34.0)
MCHC: 29.7 g/dL — ABNORMAL LOW (ref 30.0–36.0)
MCV: 80.8 fL (ref 80.0–100.0)
Monocytes Absolute: 0.8 10*3/uL (ref 0.1–1.0)
Monocytes Relative: 10 %
Neutro Abs: 6 10*3/uL (ref 1.7–7.7)
Neutrophils Relative %: 76 %
Platelets: 225 10*3/uL (ref 150–400)
RBC: 4.26 MIL/uL (ref 4.22–5.81)
RDW: 21.5 % — ABNORMAL HIGH (ref 11.5–15.5)
WBC: 7.9 10*3/uL (ref 4.0–10.5)
nRBC: 0 % (ref 0.0–0.2)

## 2019-06-09 LAB — BASIC METABOLIC PANEL
Anion gap: 12 (ref 5–15)
BUN: 23 mg/dL (ref 8–23)
CO2: 31 mmol/L (ref 22–32)
Calcium: 8.2 mg/dL — ABNORMAL LOW (ref 8.9–10.3)
Chloride: 96 mmol/L — ABNORMAL LOW (ref 98–111)
Creatinine, Ser: 1 mg/dL (ref 0.61–1.24)
GFR calc Af Amer: >60 mL/min (ref >60)
GFR calc non Af Amer: >60 mL/min (ref >60)
Glucose, Bld: 151 mg/dL — ABNORMAL HIGH (ref 70–99)
Potassium: 3.5 mmol/L (ref 3.5–5.1)
Sodium: 139 mmol/L (ref 135–145)

## 2019-06-09 LAB — GLUCOSE, CAPILLARY
Glucose-Capillary: 140 mg/dL — ABNORMAL HIGH (ref 70–99)
Glucose-Capillary: 169 mg/dL — ABNORMAL HIGH (ref 70–99)
Glucose-Capillary: 142 mg/dL — ABNORMAL HIGH (ref 70–99)
Glucose-Capillary: 229 mg/dL — ABNORMAL HIGH (ref 70–99)

## 2019-06-09 MED ORDER — POTASSIUM CHLORIDE CRYS ER 20 MEQ PO TBCR
40.0000 meq | EXTENDED_RELEASE_TABLET | Freq: Every day | ORAL | Status: DC
  Administered 2019-06-09 – 2019-06-10 (×2): 40 meq via ORAL
  Filled 2019-06-09 (×2): qty 2

## 2019-06-09 NOTE — Progress Notes (Signed)
Pt resting comfortably on O2- 4L NCAN. BIPAP not needed at this time

## 2019-06-09 NOTE — Progress Notes (Signed)
PROGRESS NOTE  [name] ERX:[id number] DOB: 01/24/1946 DOA: 06/07/2019 PCP: [name], NP  Brief History   The patient is a 73 yr old man who is on 3L of O2 at home at baseline. He presents to [facility] ED on 06/07/2019 with complaints of dyspnea x 2 days. In the ED he was found to have acute on chronic respiratory failure due to CHF exacerbation in setting of moderate pulmonary hypertension and COPD. BNP was elevated as was troponin. Troponin trended up from 44 to 72.   [facility] were consulted to admit the patient for further evaluation and treatment. Cardiology was consulted. He was admitted to a telemetry bed. Echocardiogram was obtained that demonstrated LVEF of 25-30% with severely reduced LV function and regional wall motion abnormalities. There is also apical akinesis. 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 Stable from recent CTA. Bioprosthetic mitral valve is present. Gradients stable.   The patient will be diuresed with lasix 40 mg IV bid. The patient is end stage CHF. He has refused palliative care services in the past, although cardiology contineus to feel that this is appropriate.  Consultants  . Cardiology  Procedures  . None  Antibiotics   Anti-infectives (From admission, onward)   Start     Dose/Rate Route Frequency Ordered Stop   06/07/19 1230  cefTRIAXone (ROCEPHIN) 2 g in sodium chloride 0.9 % 100 mL IVPB  Status:  Discontinued     2 g 200 mL/hr over 30 Minutes Intravenous Every 24 hours 06/07/19 1223 06/07/19 1321   06/07/19 1230  azithromycin (ZITHROMAX) 500 mg in sodium chloride 0.9 % 250 mL IVPB  Status:  Discontinued     500 mg 250 mL/hr over 60 Minutes Intravenous Every 24 hours 06/07/19 1223 06/07/19 1321      Subjective  The patient is resting. He is complaining of left sided pleuritic chest pain.  Objective   Vitals:  Vitals:   06/09/19 0851 06/09/19 1153  BP: 104/62 108/65  Pulse: 74 69  Resp:  18 20  Temp: 97.6 F (36.4 C) 98.2 F (36.8 C)  SpO2: 94% 95%   Exam:  Constitutional:  . The patient is awake, alert, and oriented x 3. No new complaints. Respiratory:  Marland Kitchen Mildly increased work of breathing especially with conversation. . No wheezes, rhonchi, or rales . No tactile fremitus Cardiovascular:  . Regular rate and rhythm . No murmurs, ectopy, or gallups. . No lateral PMI. No thrills. Abdomen:  . Abdomen is soft, non-tender, non-distended . No hernias, masses, or organomegaly . Normoactive bowel sounds.  Musculoskeletal:  . No cyanosis, clubbing, or edema Skin:  . No rashes, lesions, ulcers . palpation of skin: no induration or nodules Neurologic:  . CN 2-12 intact . Sensation all 4 extremities intact Psychiatric:  . Mental status o Mood, affect appropriate o Orientation to person, place, time  . judgment and insight appear intact  I have personally reviewed the following:   Today's Data  . Vitals, BMP, CBC  Micro Data  . Blood cultures x 2: No growth Scheduled Meds: . aspirin EC  81 mg Oral Daily  . atorvastatin  10 mg Oral Daily  . furosemide  40 mg Intravenous Q12H  . insulin aspart  0-15 Units Subcutaneous TID WC  . metoprolol succinate  12.5 mg Oral Daily  . mometasone-formoterol  2 puff Inhalation BID  . potassium chloride  40 mEq Oral Daily  . sodium chloride flush  3  mL Intravenous Q12H   Continuous Infusions: . sodium chloride      Principal Problem:   Acute on chronic respiratory failure with hypoxia (HCC) Active Problems:   COPD GOLD II   Hypertension   Dyslipidemia   Diabetes ([place])   Lung cancer (HCC)   Acute on chronic combined systolic (congestive) and diastolic (congestive) heart failure (HCC)   Elevated troponin   Chronic atrial fibrillation (HCC)   LOS: 2 days   A & P   Acute on chronic respiratory failure associated with decompensated severe systolic CHF:  Echocardiogram performed on 06/07/2019 demonstrates LVEF of  25-30% with severely reduced LV function and regional wall motion abnormalities. There is also apical akinesis. 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 Stable from recent CTA. Bioprosthetic mitral valve is present. Gradients stable. CXR consistent with pulmonary edema that is severe and requires new O2 therapy. He will likely also require prn IBPAP. No real evidence of pneumonia as a cause. Antibiotics given in ED not continued. COVID negative. Cardiology has been consulted. ACE I held for now due to acute exacerbation and elevated creatinine. The patient will be continued on ASA, and Coreg. He will be diuresed with lasix 40 mg IV bid. Cardiology plans to convert to oral diuresis tomorrow. No anticoagulation due to history of intracranial hemorrhage history.   Essential hypertension: The patient blood pressures are on the low side with Coreg, Imdur and lasix. Monitor. Blood pressures may limit ability to diurese. Cardiology would like to consider initiation of ACE I or entresto if blood pressures will tolerate it.   Hyperlipidemia: Continue Lipitor as prior to admission. Lipids were checked on 04/12/19 (TC 85, HDL 45, LDL 20, TG 95) so will not repeat at this time.  DM II: Last A1c was 7.5 on 04/12/19. Glucophage has been held. Will follow glucoses with FSBS and SSI. Glucoses over the past 24 hours have been 140 - 184.   COPD: No exacerbation is evident currently. As needed nebulizer treatments available. Monitor.  Lung cancer: Stage 1A NSCLC, well-differentiated adenocarcinoma in 08/2013, s/p wedge resection. Observation of now. Due for CT to further evaluate mediastinal LAD in January.  Goals of care: Has previously refused palliative care services. He is a DNR, but entire scope of treatment is desired. His daughter seemed very concerned, "Is my daddy going to die?". Cardiology feels that it is appropriate to revisit this. I will order palliative care consult.   Note: This patient has been tested and is negative for the novel coronavirus COVID-19 by both BD and Cepheid testing.  I have seen and examined this patient myself. I have spent 32 minutes in his evaluation and care.  DVT prophylaxis: SCDs due to h/o ICH on Coumadin Code Status:  DNR - confirmed with patient Family Communication: None present. Disposition Plan:  Home once clinically improved  Christofer Shen, DO [facility] Direct contact: see [email or web address]  7PM-7AM contact night coverage as above 06/09/2019, 2:22 PM  LOS: 1 day

## 2019-06-09 NOTE — Progress Notes (Addendum)
Progress Note  Patient Name: [name] Date of Encounter: 06/09/2019  Primary Cardiologist: Candee Furbish, MD   Subjective   Feeling better.  Breathing not back to baseline yet but much improved.   Inpatient Medications    Scheduled Meds: . aspirin EC  81 mg Oral Daily  . atorvastatin  10 mg Oral Daily  . furosemide  40 mg Intravenous Q12H  . insulin aspart  0-15 Units Subcutaneous TID WC  . metoprolol succinate  12.5 mg Oral Daily  . mometasone-formoterol  2 puff Inhalation BID  . sodium chloride flush  3 mL Intravenous Q12H   Continuous Infusions: . sodium chloride     PRN Meds: sodium chloride, acetaminophen, albuterol, ondansetron (ZOFRAN) IV, sodium chloride flush   Vital Signs    Vitals:   06/08/19 1304 06/08/19 2012 06/08/19 2029 06/09/19 0508  BP: 94/63 101/62  107/67  Pulse: 73 75 74 74  Resp: 18 18 16 18   Temp: 98.3 F (36.8 C) 98.9 F (37.2 C)  98.6 F (37 C)  TempSrc: Oral Oral  Oral  SpO2: 96%  95% (!) 79%  Weight:    70.4 kg  Height:        Intake/Output Summary (Last 24 hours) at 06/09/2019 0745 Last data filed at 06/08/2019 2200 Gross per 24 hour  Intake 1180 ml  Output 2300 ml  Net -1120 ml   Filed Weights   06/07/19 0943 06/08/19 1009 06/09/19 0508  Weight: 69 kg 70.8 kg 70.4 kg    Telemetry    Atrial flutter with V pacing with occasional PVCs - Personally Reviewed  ECG    Atrial fibrillation with V paced rhythm - Personally Reviewed  Physical Exam   GEN: No acute distress.  Cachectic appearing Neck: No JVD Cardiac: irregularly irregular, no murmurs, rubs, or gallops.  Respiratory: decreased BS at bases GI: Soft, nontender, non-distended  MS: 1+ LE edema; No deformity. Neuro:  Nonfocal  Psych: Normal affect   Labs    Chemistry Recent Labs  Lab 06/07/19 1007 06/08/19 0452 06/09/19 0445  NA 139 140 139  K 4.1 4.3 3.5  CL 103 100 96*  CO2 24 27 31   GLUCOSE 226* 222* 151*  BUN 28* 28* 23  CREATININE 1.29*  1.18 1.00  CALCIUM 8.6* 8.7* 8.2*  PROT 6.0*  --   --   ALBUMIN 3.1*  --   --   AST 66*  --   --   ALT 53*  --   --   ALKPHOS 56  --   --   BILITOT 2.3*  --   --   GFRNONAA 55* >60 >60  GFRAA >60 >60 >60  ANIONGAP 12 13 12      Hematology Recent Labs  Lab 06/07/19 1007 06/07/19 2326 06/09/19 0445  WBC 9.0 7.2 7.9  RBC 4.55 4.77 4.26  HGB 10.7* 11.4* 10.2*  HCT 37.9* 38.8* 34.4*  MCV 83.3 81.3 80.8  MCH 23.5* 23.9* 23.9*  MCHC 28.2* 29.4* 29.7*  RDW 22.0* 21.9* 21.5*  PLT 222 221 225    Cardiac EnzymesNo results for input(s): TROPONINI in the last 168 hours. No results for input(s): TROPIPOC in the last 168 hours.   BNP Recent Labs  Lab 06/07/19 1007  BNP 1,011.1*     DDimer No results for input(s): DDIMER in the last 168 hours.   Radiology    Xr Chest Portable  Result Date: 06/07/2019 CLINICAL DATA:  Shortness of breath, feeling bad for 2 days.  EXAM: PORTABLE CHEST 1 VIEW COMPARISON:  04/27/2019 FINDINGS: Cardiomediastinal contours remain enlarged. Signs of right-sided dual lead pacer defibrillator are unchanged. Signs of increased interstitial markings are increased from the prior exam. Postoperative changes in the left lung are again noted. Background pulmonary emphysema as before. Graded opacity at the right lung base suggests pleural effusion. No acute bone findings. IMPRESSION: 1. Worsening pulmonary edema. 2. Graded opacity at the right lung base suggests pleural effusion. 3. Background pulmonary emphysema. 4. Stable enlargement of cardiomediastinal contours. Electronically Signed   By: Zetta Bills M.D.   On: 06/07/2019 10:41    Cardiac Studies   Echo 04/28/19 1. Left ventricular ejection fraction, by visual estimation, is 25 to 30%. The left ventricle has moderate to severely decreased function. Mildly increased left ventricular size. There is mildly increased left ventricular hypertrophy. 2. Multiple segmental abnormalities exist. See findings. 3.  Left ventricular diastolic Doppler parameters are indeterminate pattern of LV diastolic filling. 4. Global right ventricle has moderately reduced systolic function.The right ventricular size is normal. No increase in right ventricular wall thickness. 5. Left atrial size was severely dilated. 6. Right atrial size was severely dilated. 7. Mild aortic valve annular calcification. 8. The mitral valve has been repaired/replaced. There is a 33 mm Medtronic Mosiac prosthesis in position. Trace mitral valve regurgitation. Mean gradient of 5 mmHg suggest mildly stenotic flow. 9. The tricuspid valve is grossly normal. Tricuspid valve regurgitation is mild. 10. The aortic valve is tricuspid Aortic valve regurgitation is trivial by color flow Doppler. 11. The pulmonic valve was grossly normal. Pulmonic valve regurgitation is not visualized by color flow Doppler. 12. The aortic root was not well visualized. 13. Proximal aorta and arch are not well defined. The aortic root appears dilated. CTA would define course better if clinically indicated. 14. Moderately elevated pulmonary artery systolic pressure. 15. A device wire is visualized. 16. The inferior vena cava is dilated in size with <50% respiratory variability, suggesting right atrial pressure of 15 mmHg. 17. The tricuspid regurgitant velocity is 3.19 m/s, and with an assumed right atrial pressure of 15 mmHg, the estimated right ventricular systolic pressure is moderately elevated at 55.7 mmHg.  Patient Profile     74 y.o. male with a hx of MV endocarditis c/b severe MR s/p minimally invasive bioprosthetic MVR 2018, moderate pulmonary HTN, non-ischemic cardiomyopathy, aifb s/p nodal ablation (not able to on [facility] due to [place]),  s/p BI -V-ICD, lung cancer, sinus valsalva aneurysm, HTN, HLD, DM, CVA (2009), COPD on 3L [place] O2, chronic combined CHF with AICD placement who is being seen  for the evaluation of shortness of breath at the request of Dr. Lorin Mercy.   Assessment & Plan    SOB/Acute on Chronic combined HF -Patient presents with 2-3 days of progressive SOB.  -BNP elevated to >1,000. CXR significant for pulmonary edema, right pleural effusion. -Rapid COVID negative.  -Echo in October with EF 99-37% with diastolic dysfunction. -currently on Lasix 40mg  IV BID -he put out 2.3L yesterday and is net neg 3.63L today -weight only down 1 lbs from yesterday and up 3lbs from admit - likely inaccurate -weight on admission 152 lbs.        11/10- 155 lbs       05/02/19-151 lbs -creatinine improved with diuresis 1.29>>1.18>>1 today (baseline around 1) -still has LE edema so will continue lasix IV one more day and change to PO tomorrow -K+ 3.5 > will replete -check BMET in am -patient was previously on Valsartan>> was  discontinued for hypotension. -currently BP soft but if improves can consider starting ARB with plan to switch to entresto -continue Toprol XL 12.5mg  daily (changed from tartrate due to COPD)  -patient is endstage CHF but refuses Palliative Care - he is DNR  Rt pleural effusion - contributing to severe SOB  Fever - rectal temp 100>>resolved - blood cultures and lactate pending - felt not to have PNA and antibx stopped  Elevated troponin/Nonobstructive CAD -Patient complained of chest pain/tightness.  -Last reported cath was 2009 which revealed 30% distal LAD stenosis, otherwise normal coronaries.  -he has known 36mm right SOV aneurysm impinging on RVOT by coronary CTA 12/2018 and not a surgical candidate or candidate for PCI in the future - he is DNR - Hs troponin 44>72  - EKG with no new ischemic changes - Elevation likely from demand ischemia - continue aspirin, BB, statin - Conservative management - consider revisiting Palliative Care Consult  Persistent afib s/p ablation / Aflutter - rate controlled in aflutter with V paced rhythm - not a candidate for a/c due to [place] - has BiV/ICD - rate control with BB  Prior MV  replacement - seen on echo 04/28/19 trace MR, mean gradient 5 mmHg  HTN/H/o orthostatic hypotension - BP remains soft at 107/7mmHg - continue low dose BB  HLD - continue Lipitor  Large right sinus valsalva anueryms (36mm) - following with Dr. Roxy Manns no plans for intervention   COPD on chronic O2 - per [facility]  DM2 - SSI per IM    I have spent a total of 30 minutes with patient reviewing notes , telemetry, EKGs, labs and examining patient as well as establishing an assessment and plan that was discussed with the patient.  > 50% of time was spent in direct patient care.    For questions or updates, please contact [place] Please consult [email or web address] for contact info under Cardiology/STEMI.      Signed, Fransico Him, MD  06/09/2019, 7:45 AM

## 2019-06-10 ENCOUNTER — Inpatient Hospital Stay (HOSPITAL_COMMUNITY): Payer: Medicare Other

## 2019-06-10 ENCOUNTER — Telehealth: Payer: Self-pay | Admitting: Adult Health

## 2019-06-10 ENCOUNTER — Other Ambulatory Visit: Payer: Self-pay | Admitting: *Deleted

## 2019-06-10 LAB — D-DIMER, QUANTITATIVE: D-Dimer, Quant: 1.4 ug/mL-FEU — ABNORMAL HIGH (ref 0.00–0.50)

## 2019-06-10 LAB — BASIC METABOLIC PANEL
Anion gap: 10 (ref 5–15)
Anion gap: 9 (ref 5–15)
BUN: 20 mg/dL (ref 8–23)
BUN: 25 mg/dL — ABNORMAL HIGH (ref 8–23)
CO2: 31 mmol/L (ref 22–32)
CO2: 31 mmol/L (ref 22–32)
Calcium: 8.3 mg/dL — ABNORMAL LOW (ref 8.9–10.3)
Calcium: 8.4 mg/dL — ABNORMAL LOW (ref 8.9–10.3)
Chloride: 95 mmol/L — ABNORMAL LOW (ref 98–111)
Chloride: 98 mmol/L (ref 98–111)
Creatinine, Ser: 1.02 mg/dL (ref 0.61–1.24)
Creatinine, Ser: 1.11 mg/dL (ref 0.61–1.24)
GFR calc Af Amer: >60 mL/min (ref >60)
GFR calc Af Amer: >60 mL/min (ref >60)
GFR calc non Af Amer: >60 mL/min (ref >60)
GFR calc non Af Amer: >60 mL/min (ref >60)
Glucose, Bld: 176 mg/dL — ABNORMAL HIGH (ref 70–99)
Glucose, Bld: 278 mg/dL — ABNORMAL HIGH (ref 70–99)
Potassium: 3.5 mmol/L (ref 3.5–5.1)
Potassium: 3.9 mmol/L (ref 3.5–5.1)
Sodium: 136 mmol/L (ref 135–145)
Sodium: 138 mmol/L (ref 135–145)

## 2019-06-10 LAB — GLUCOSE, CAPILLARY
Glucose-Capillary: 159 mg/dL — ABNORMAL HIGH (ref 70–99)
Glucose-Capillary: 251 mg/dL — ABNORMAL HIGH (ref 70–99)
Glucose-Capillary: 158 mg/dL — ABNORMAL HIGH (ref 70–99)
Glucose-Capillary: 154 mg/dL — ABNORMAL HIGH (ref 70–99)

## 2019-06-10 LAB — TROPONIN I (HIGH SENSITIVITY): Troponin I (High Sensitivity): 34 ng/L — ABNORMAL HIGH (ref <18)

## 2019-06-10 MED ORDER — METOPROLOL SUCCINATE ER 25 MG PO TB24: 12.5000 mg | ORAL_TABLET | Freq: Every day | ORAL | 0 refills | Status: DC

## 2019-06-10 MED ORDER — FUROSEMIDE 40 MG PO TABS
40.0000 mg | ORAL_TABLET | Freq: Two times a day (BID) | ORAL | Status: DC
  Administered 2019-06-10 (×2): 40 mg via ORAL
  Filled 2019-06-10 (×2): qty 1

## 2019-06-10 MED ORDER — METFORMIN HCL 1000 MG PO TABS: 1000.0000 mg | ORAL_TABLET | Freq: Two times a day (BID) | ORAL | 0 refills | Status: DC

## 2019-06-10 MED ORDER — POTASSIUM CHLORIDE CRYS ER 20 MEQ PO TBCR: 40.0000 meq | EXTENDED_RELEASE_TABLET | Freq: Every day | ORAL | 0 refills | Status: DC

## 2019-06-10 MED ORDER — IOHEXOL 350 MG/ML SOLN
100.0000 mL | Freq: Once | INTRAVENOUS | Status: AC | PRN
  Administered 2019-06-10: 16:00:00 80 mL via INTRAVENOUS

## 2019-06-10 NOTE — Discharge Summary (Signed)
Physician Discharge Summary  [name] JIR:[id number] DOB: 03/19/1946 DOA: 06/07/2019  PCP: [name], NP  Admit date: 06/07/2019 Discharge date: 06/10/2019  Admitted From: Home  Disposition:  Home   Recommendations for Outpatient Follow-up:  1. Follow up with PCP in 1-2 weeks 2. Please obtain BMP/CBC in one week 3. Follow up with cardiology for further care of Heart failure.     Discharge Condition: Stable.  CODE STATUS: DNR Diet recommendation: Heart Healthy   Brief/Interim Summary: The patient is a 73 yr old man who is on 3L of O2 at home at baseline. He presents to [facility] ED on 06/07/2019 with complaints of dyspnea x 2 days. In the ED he was found to have acute on chronic respiratory failure due to CHF exacerbation in setting of moderate pulmonary hypertension and COPD. BNP was elevated as was troponin. Troponin trended up from 44 to 72.   [facility] were consulted to admit the patient for further evaluation and treatment. Cardiology was consulted. He was admitted to a telemetry bed. Echocardiogram was obtained that demonstrated LVEF of 25-30% with severely reduced LV function and regional wall motion abnormalities. There is also apical akinesis. 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 Stable from recent CTA. Bioprosthetic mitral valve is present. Gradients stable.   The patient will be diuresed with lasix 40 mg IV bid. The patient is end stage CHF. He has refused palliative care servicesin the past, although cardiology contineus to feel that this is appropriate.  He complained of transient chest cramp today, repeated troponin are lower. D dimer mildly elevated. CT angio negative for PE.   1-Acute on chronic respiratory failure associated with decompensated severe systolic CHF:  Echocardiogram performed on 06/07/2019 demonstrates LVEF of 25-30% with severely reduced LV function and regional wall motion abnormalities. There is  also apical akinesis. 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 Stable from recent CTA. Bioprosthetic mitral valve is present. Gradients stable. CXR consistent with pulmonary edema that is severe and requires new O2 therapy. No real evidence of pneumonia as a cause. Antibiotics given in ED not continued. COVID negative. Cardiology has been consulted. ACE I held for now due to acute exacerbation and elevated creatinine. The patient will be continued on ASA, and Coreg. He was diuresed with lasix 40 mg IV bid.Cardiology plans to convert to oral diuresis today. Patient stable for discharge today. No anticoagulation due to history of intracranial hemorrhage history.   Essential hypertension: The patient blood pressures are on the low side with Coreg, Imdur and lasix. Monitor. Blood pressures may limit ability to diurese.Cardiology would like to consider initiation of ACE I or entresto if blood pressures will tolerate it.follow out patient.   Hyperlipidemia: Continue Lipitor as prior to admission. Lipids were checkedon 04/12/19(TC 85, HDL 45, LDL20, TG95) so will not repeat at this time.  DM II: Last A1c was7.5 on 04/12/19. Glucophage has been held during admission. Resume at discharge. . Will follow glucoses with FSBS and SSI. Glucoses over the past 24 hours have been 140- 184.   COPD: No exacerbation is evident currently. As needed nebulizer treatments available. Monitor.  Lung cancer: Stage 1A NSCLC, well-differentiated adenocarcinoma in 08/2013, s/p wedge resection. Observation of now. Due for CT to further evaluate mediastinal LAD in January.  Goals of care: Has previously refused palliative care services. He is a DNR, but entire scope of treatment is desired.   Discharge Diagnoses:  Principal  Problem:   Acute on chronic respiratory failure with hypoxia (HCC) Active Problems:   COPD GOLD II   Hypertension   Dyslipidemia   Diabetes ([name])   Lung  cancer (HCC)   Acute on chronic combined systolic (congestive) and diastolic (congestive) heart failure (HCC)   Elevated troponin   Chronic atrial fibrillation [facility])    Discharge Instructions  Discharge Instructions    Diet - low sodium heart healthy   Complete by: As directed    Increase activity slowly   Complete by: As directed      Allergies as of 06/10/2019      Reactions   Anticoagulant Compound Other (See Comments)   Pt had intracranial bleed, therefore all anticoagulation is contraindicated per Dr. Ron Parker   Other Other (See Comments)   Per Dr. Halford Chessman (Surgeon): stated that the patient cannot be put under for any surgery, as he has an enlarged aorta. He would stand only a 50/50 chance of surviving. He has lung issues, diminished lung tissue, COPD, and emphysema.   Warfarin Sodium Other (See Comments)   Pt had intracranial bleed, therefore all anticoagulation is contraindicated per Dr. Ron Parker      Medication List    STOP taking these medications   carvedilol 3.125 MG tablet Commonly known as: COREG     TAKE these medications   acetaminophen 500 MG tablet Commonly known as: TYLENOL Take 500-1,000 mg by mouth every 8 (eight) hours as needed for moderate pain or headache.   aspirin EC 81 MG tablet Take 1 tablet (81 mg total) by mouth daily.   atorvastatin 10 MG tablet Commonly known as: LIPITOR Take 10 mg by mouth daily.   budesonide-formoterol 160-4.5 MCG/ACT inhaler Commonly known as: Symbicort INHALE 2 PUFFS INTO THE LUNGS TWICE A DAY What changed:   how much to take  how to take this  when to take this  additional instructions   furosemide 40 MG tablet Commonly known as: LASIX Take 1 tablet (40 mg total) by mouth 2 (two) times daily.   metFORMIN 1000 MG tablet Commonly known as: GLUCOPHAGE Take 1 tablet (1,000 mg total) by mouth 2 (two) times daily with a meal. Start taking on: June 12, 2019 What changed: These instructions start on  June 12, 2019. If you are unsure what to do until then, ask your doctor or other care provider.   metoprolol succinate 25 MG 24 hr tablet Commonly known as: TOPROL-XL Take 0.5 tablets (12.5 mg total) by mouth daily. Start taking on: June 11, 2019   nitroGLYCERIN 0.4 MG SL tablet Commonly known as: NITROSTAT Place 0.4 mg under the tongue every 5 (five) minutes x 3 doses as needed for chest pain.   OXYGEN Inhale 2-3 L/min into the lungs continuous.   potassium chloride SA 20 MEQ tablet Commonly known as: KLOR-CON Take 2 tablets (40 mEq total) by mouth daily. Start taking on: June 11, 2019   Proventil HFA 108 (90 Base) MCG/ACT inhaler Generic drug: albuterol Inhale 2 puffs into the lungs every 6 (six) hours as needed for wheezing or shortness of breath.            Durable Medical Equipment  (From admission, onward)         Start     Ordered   06/10/19 1234  For home use only DME oxygen  Once    Question Answer Comment  Length of Need 12 Months   Mode or (Route) Nasal cannula   Liters per Minute  4   Frequency Continuous (stationary and portable oxygen unit needed)   Oxygen conserving device Yes   Oxygen delivery system Gas      06/10/19 1234         Follow-up Information    [name], MD. Go on 06/14/2019.   Specialty: Cardiology Why: @11 :40am for hospital follow up Contact information: [address]  [place] [id number] [phone]          Allergies  Allergen Reactions  . Anticoagulant Compound Other (See Comments)    Pt had intracranial bleed, therefore all anticoagulation is contraindicated per Dr. Ron Parker  . Other Other (See Comments)    Per Dr. Halford Chessman (Surgeon): stated that the patient cannot be put under for any surgery, as he has an enlarged aorta. He would stand only a 50/50 chance of surviving. He has lung issues, diminished lung tissue, COPD, and emphysema.  . Warfarin Sodium Other (See Comments)    Pt  had intracranial bleed, therefore all anticoagulation is contraindicated per Dr. Ron Parker    Consultations: Cardiology  Procedures/Studies: Ct Angio Chest Pe W Or Wo Contrast  Result Date: 06/10/2019 CLINICAL DATA:  Chest pain.  History of lung cancer. EXAM: CT ANGIOGRAPHY CHEST WITH CONTRAST TECHNIQUE: Multidetector CT imaging of the chest was performed using the standard protocol during bolus administration of intravenous contrast. Multiplanar CT image reconstructions and MIPs were obtained to evaluate the vascular anatomy. CONTRAST:  43mL OMNIPAQUE IOHEXOL 350 MG/ML SOLN COMPARISON:  Chest radiograph dated 06/07/2019. CT chest dated 03/25/2019. FINDINGS: Cardiovascular: Satisfactory opacification of the pulmonary arteries to the segmental level. No evidence of pulmonary embolism. Cardiomegaly with left atrial enlargement. No pericardial effusion. Right subclavian ICD. No evidence of thoracic aortic aneurysm. Three vessel coronary atherosclerosis. Mediastinum/Nodes: Stable thoracic lymphadenopathy, including a 1.6 cm short axis AP window node (series 5/image 56) and a 2.1 cm short axis right azygoesophageal recess node (series 5/image 79), chronic. Visualized thyroid is unremarkable. Lungs/Pleura: Status post left upper lobe wedge resection with associated volume loss. Dependent atelectasis in the bilateral lower lobes. Small right pleural effusion, partially loculated, mildly increased. Moderate centrilobular and paraseptal emphysematous changes, upper lung predominant. No suspicious pulmonary nodules. No focal consolidation. No pneumothorax. Upper Abdomen: Visualized upper abdomen is notable for vascular calcifications and a dominant 3.0 cm posterior left renal cyst (series 5/image 135). Musculoskeletal: Degenerative changes of the visualized thoracolumbar spine. Review of the MIP images confirms the above findings. IMPRESSION: No evidence of pulmonary embolism. Status post left upper lobe wedge resection.  No findings specific for recurrent or metastatic disease. Chronic thoracic lymphadenopathy, technically indeterminate, although favored to be reactive. Additional stable ancillary findings as above. Aortic Atherosclerosis (ICD10-I70.0) and Emphysema (ICD10-J43.9). Electronically Signed   By: Julian Hy M.D.   On: 06/10/2019 16:27   Xr Chest Portable  Result Date: 06/07/2019 CLINICAL DATA:  Shortness of breath, feeling bad for 2 days. EXAM: PORTABLE CHEST 1 VIEW COMPARISON:  04/27/2019 FINDINGS: Cardiomediastinal contours remain enlarged. Signs of right-sided dual lead pacer defibrillator are unchanged. Signs of increased interstitial markings are increased from the prior exam. Postoperative changes in the left lung are again noted. Background pulmonary emphysema as before. Graded opacity at the right lung base suggests pleural effusion. No acute bone findings. IMPRESSION: 1. Worsening pulmonary edema. 2. Graded opacity at the right lung base suggests pleural effusion. 3. Background pulmonary emphysema. 4. Stable enlargement of cardiomediastinal contours. Electronically Signed   By: Zetta Bills M.D.   On: 06/07/2019 10:41  Subjective: Dyspnea improved. Report transient episode of chest pain, cramps.   Discharge Exam: Vitals:   06/10/19 1152 06/10/19 1519  BP:  (!) 107/57  Pulse:  70  Resp:    Temp:  98.5 F (36.9 C)  SpO2: 96% 93%     General: Pt is alert, awake, not in acute distress Cardiovascular: RRR, S1/S2 +, no rubs, no gallops Respiratory: CTA bilaterally, no wheezing, no rhonchi Abdominal: Soft, NT, ND, bowel sounds + Extremities: no edema, no cyanosis    The results of significant diagnostics from this hospitalization (including imaging, microbiology, ancillary and laboratory) are listed below for reference.     Microbiology: Recent Results (from the past 240 hour(s))  Blood culture (routine x 2)     Status: None (Preliminary result)   Collection Time:  06/07/19 12:50 PM   Specimen: BLOOD RIGHT ARM  Result Value Ref Range Status   Specimen Description BLOOD RIGHT ARM  Final   Special Requests   Final    BOTTLES DRAWN AEROBIC AND ANAEROBIC Blood Culture adequate volume   Culture   Final    NO GROWTH 3 DAYS Performed at [facility], [address], [place], [place] [address]    Report Status PENDING  Incomplete  Blood culture (routine x 2)     Status: None (Preliminary result)   Collection Time: 06/07/19 12:55 PM   Specimen: BLOOD RIGHT HAND  Result Value Ref Range Status   Specimen Description BLOOD RIGHT HAND  Final   Special Requests   Final    BOTTLES DRAWN AEROBIC AND ANAEROBIC Blood Culture adequate volume   Culture   Final    NO GROWTH 3 DAYS Performed at [facility], [place] [address] [place], [place] [address]    Report Status PENDING  Incomplete  SARS Coronavirus 2 by RT PCR (hospital order, performed in [facility] hospital lab) Nasopharyngeal Nasopharyngeal Swab     Status: None   Collection Time: 06/07/19  3:20 PM   Specimen: Nasopharyngeal Swab  Result Value Ref Range Status   SARS Coronavirus 2 NEGATIVE NEGATIVE Final    Comment: (NOTE) SARS-CoV-2 target nucleic acids are NOT DETECTED. The SARS-CoV-2 RNA is generally detectable in upper and lower respiratory specimens during the acute phase of infection. The lowest concentration of SARS-CoV-2 viral copies this assay can detect is 250 copies / mL. A negative result does not preclude SARS-CoV-2 infection and should not be used as the sole basis for treatment or other patient management decisions.  A negative result may occur with improper specimen collection / handling, submission of specimen other than nasopharyngeal swab, presence of viral mutation(s) within the areas targeted by this assay, and inadequate number of viral copies (<250 copies / mL). A negative result must be combined with clinical observations, patient history, and epidemiological  information. Fact Sheet for Patients:   StrictlyIdeas.no Fact Sheet for Healthcare Providers: BankingDealers.co.za This test is not yet approved or cleared  by the [place] FDA and has been authorized for detection and/or diagnosis of SARS-CoV-2 by FDA under an Emergency Use Authorization (EUA).  This EUA will remain in effect (meaning this test can be used) for the duration of the COVID-19 declaration under Section 564(b)(1) of the Act, 21 U.S.C. section 360bbb-3(b)(1), unless the authorization is terminated or revoked sooner. Performed at [facility], [place] [address], [place],  [address]      Labs: BNP (last 3 results) Recent Labs    07/30/18 1330 04/27/19  1502 06/07/19 1007  BNP 437.2* 630.0* 5,456.2*   Basic Metabolic Panel: Recent Labs  Lab 06/07/19 1007 06/08/19 0452 06/09/19 0445 06/10/19 0012 06/10/19 0517  NA 139 140 139 136 138  K 4.1 4.3 3.5 3.9 3.5  CL 103 100 96* 95* 98  CO2 24 27 31 31 31   GLUCOSE 226* 222* 151* 278* 176*  BUN 28* 28* 23 25* 20  CREATININE 1.29* 1.18 1.00 1.11 1.02  CALCIUM 8.6* 8.7* 8.2* 8.3* 8.4*   Liver Function Tests: Recent Labs  Lab 06/07/19 1007  AST 66*  ALT 53*  ALKPHOS 56  BILITOT 2.3*  PROT 6.0*  ALBUMIN 3.1*   No results for input(s): LIPASE, AMYLASE in the last 168 hours. No results for input(s): AMMONIA in the last 168 hours. CBC: Recent Labs  Lab 06/07/19 1007 06/07/19 2326 06/09/19 0445  WBC 9.0 7.2 7.9  NEUTROABS  --  6.1 6.0  HGB 10.7* 11.4* 10.2*  HCT 37.9* 38.8* 34.4*  MCV 83.3 81.3 80.8  PLT 222 221 225   Cardiac Enzymes: No results for input(s): CKTOTAL, CKMB, CKMBINDEX, TROPONINI in the last 168 hours. BNP: Invalid input(s): POCBNP CBG: Recent Labs  Lab 06/09/19 1640 06/09/19 2211 06/10/19 0651 06/10/19 0809 06/10/19 1120  GLUCAP 142* 229* 159* 251* 158*   D-Dimer Recent Labs    06/10/19 1050  DDIMER 1.40*   Hgb  A1c No results for input(s): HGBA1C in the last 72 hours. Lipid Profile No results for input(s): CHOL, HDL, LDLCALC, TRIG, CHOLHDL, LDLDIRECT in the last 72 hours. Thyroid function studies No results for input(s): TSH, T4TOTAL, T3FREE, THYROIDAB in the last 72 hours.  Invalid input(s): FREET3 Anemia work up No results for input(s): VITAMINB12, FOLATE, FERRITIN, TIBC, IRON, RETICCTPCT in the last 72 hours. Urinalysis    Component Value Date/Time   COLORURINE YELLOW 06/07/2019 1231   APPEARANCEUR CLOUDY (A) 06/07/2019 1231   LABSPEC 1.013 06/07/2019 1231   PHURINE 5.0 06/07/2019 1231   GLUCOSEU 150 (A) 06/07/2019 1231   HGBUR SMALL (A) 06/07/2019 1231   HGBUR negative 04/05/2010 0837   BILIRUBINUR NEGATIVE 06/07/2019 1231   BILIRUBINUR n 03/27/2012 1323   KETONESUR NEGATIVE 06/07/2019 1231   PROTEINUR NEGATIVE 06/07/2019 1231   UROBILINOGEN 1.0 11/28/2013 1000   NITRITE NEGATIVE 06/07/2019 1231   LEUKOCYTESUR LARGE (A) 06/07/2019 1231   Sepsis Labs Invalid input(s): PROCALCITONIN,  WBC,  LACTICIDVEN Microbiology Recent Results (from the past 240 hour(s))  Blood culture (routine x 2)     Status: None (Preliminary result)   Collection Time: 06/07/19 12:50 PM   Specimen: BLOOD RIGHT ARM  Result Value Ref Range Status   Specimen Description BLOOD RIGHT ARM  Final   Special Requests   Final    BOTTLES DRAWN AEROBIC AND ANAEROBIC Blood Culture adequate volume   Culture   Final    NO GROWTH 3 DAYS Performed at [facility], [place] [address], [place], [place] [address]    Report Status PENDING  Incomplete  Blood culture (routine x 2)     Status: None (Preliminary result)   Collection Time: 06/07/19 12:55 PM   Specimen: BLOOD RIGHT HAND  Result Value Ref Range Status   Specimen Description BLOOD RIGHT HAND  Final   Special Requests   Final    BOTTLES DRAWN AEROBIC AND ANAEROBIC Blood Culture adequate volume   Culture   Final    NO GROWTH 3 DAYS Performed at [facility], [place] [address] [place], [place]  [address]    Report Status PENDING  Incomplete  SARS Coronavirus 2 by RT PCR (hospital order, performed in [facility] hospital lab) Nasopharyngeal Nasopharyngeal Swab     Status: None   Collection Time: 06/07/19  3:20 PM   Specimen: Nasopharyngeal Swab  Result Value Ref Range Status   SARS Coronavirus 2 NEGATIVE NEGATIVE Final    Comment: (NOTE) SARS-CoV-2 target nucleic acids are NOT DETECTED. The SARS-CoV-2 RNA is generally detectable in upper and lower respiratory specimens during the acute phase of infection. The lowest concentration of SARS-CoV-2 viral copies this assay can detect is 250 copies / mL. A negative result does not preclude SARS-CoV-2 infection and should not be used as the sole basis for treatment or other patient management decisions.  A negative result may occur with improper specimen collection / handling, submission of specimen other than nasopharyngeal swab, presence of viral mutation(s) within the areas targeted by this assay, and inadequate number of viral copies (<250 copies / mL). A negative result must be combined with clinical observations, patient history, and epidemiological information. Fact Sheet for Patients:   StrictlyIdeas.no Fact Sheet for Healthcare Providers: BankingDealers.co.za This test is not yet approved or cleared  by the [place] FDA and has been authorized for detection and/or diagnosis of SARS-CoV-2 by FDA under an Emergency Use Authorization (EUA).  This EUA will remain in effect (meaning this test can be used) for the duration of the COVID-19 declaration under Section 564(b)(1) of the Act, 21 U.S.C. section 360bbb-3(b)(1), unless the authorization is terminated or revoked sooner. Performed at [facility], [place] [address], [place], [place] [address]      Time coordinating discharge: 40 minutes  SIGNED:   Elmarie Shiley,  MD  [facility]

## 2019-06-10 NOTE — Progress Notes (Signed)
Case Menager contacted and requested walking saturation. Saturation done, CM contacted back

## 2019-06-10 NOTE — Progress Notes (Signed)
Patient discharged: Home with family  Via: Wheelchair   Discharge paperwork given: to patient and family  Reviewed with teach back  IV and telemetry disconnected  Belongings given to patient    

## 2019-06-10 NOTE — Progress Notes (Signed)
Occupational Therapy Treatment Patient Details Name: [name] MRN: [id number] DOB: [birth year] Today's Date: 06/10/2019    History of present illness Patient with hx nicm, chf, afib, lung ca, AICD, mitral valve replacement, COPD, CVA, HTN, DM2 presents via EMS with increased sob in the past 2-3 days. Symptoms gradual onset, severe, persistent, felt worse today. Normally uses home o2 3 liters. Found to have acute on chronic respiratory failure with hypoxia   OT comments  Patient sitting up in bed upon arrival, reports he is going home today. Declining OOB activity however agreeable to education regarding energy conservation strategies. OT educate patient which would be most applicable to him at home.    Follow Up Recommendations  Home health OT;Supervision/Assistance - 24 hour    Equipment Recommendations  None recommended by OT       Precautions / Restrictions Precautions Precautions: Fall Precaution Comments: monitor O2 sats Restrictions Weight Bearing Restrictions: No       Mobility Bed Mobility               General bed mobility comments: max encouragement however patient decline getting out of bed        ADL either performed or assessed with clinical judgement   ADL                                         General ADL Comments: max encouragement to get out of bed, patient denies needing bathroom, already performed grooming/hygiene               Cognition Arousal/Alertness: Awake/alert Behavior During Therapy: Flat affect Overall Cognitive Status: Within Functional Limits for tasks assessed                                 General Comments: required multiple cues/explanations however seemed to be due to hard of hearing              General Comments Educate and provide patient with energy conservation hand out and those that will be most applicable to patient at home. Patient require multiple explanations appeared to  have some difficulty hearing this OT.     Pertinent Vitals/ Pain       Pain Assessment: No/denies pain         Frequency  Min 2X/week        Progress Toward Goals  OT Goals(current goals can now be found in the care plan section)  Progress towards OT goals: Progressing toward goals  Acute Rehab OT Goals Patient Stated Goal: to go home OT Goal Formulation: With patient Time For Goal Achievement: 06/22/19 Potential to Achieve Goals: Good ADL Goals Additional ADL Goal #1: Pt will be aware of energy conservation strategies from handout that will be of benefit to him  Plan Discharge plan remains appropriate       AM-PAC OT "6 Clicks" Daily Activity     Outcome Measure   Help from another person eating meals?: None Help from another person taking care of personal grooming?: A Little Help from another person toileting, which includes using toliet, bedpan, or urinal?: A Little Help from another person bathing (including washing, rinsing, drying)?: A Little Help from another person to put on and taking off regular upper body clothing?: A Little Help from another person to put on and taking off  regular lower body clothing?: A Little 6 Click Score: 19    End of Session  OT Visit Diagnosis: Unsteadiness on feet (R26.81);Muscle weakness (generalized) (M62.81)   Activity Tolerance Patient tolerated treatment well   Patient Left in bed;with call bell/phone within reach           Time: 0902-0911 OT Time Calculation (min): 9 min  Charges: OT General Charges $OT Visit: 1 Visit OT Treatments $Self Care/Home Management : 8-22 mins  Shon Millet OT OT office: [place] 06/10/2019, 12:16 PM

## 2019-06-10 NOTE — Consult Note (Addendum)
   [facility] CM Inpatient Consult   06/10/2019  [name] 05/06/1946 [id number]   1700:  Attempts to reach patient in room x 2 phone was ringing busy. Spoke with the patient on the 3rd call and he request that [facility] can follow up with his wife for needs.   Will have staff follow up upon transition to home.  10:59 am:  Patient is active with [place] Coordinator with a medium  risk score for unplanned readmission. [facility] RN Care Cooridnator had outreach attempts without much success per notes.   Review of patient's medical record reveals patient is admitted with respiratory failure per 06/08/2019 Cardiology MD notes with need for high flow oxygen.  Primary Care Provider:  [name], NP  Pharmacy is:  [facility]  Transportation to provider:self/family  Follow up with inpatient [facility] team member to make aware of [facility] was following without success. Spoke with [facility] RNCM and is willing to follow up if patient agrees to post hospital follow up. Patient is in the Medicare [employer] ACO and eligible for benefits. Plan: Continue to follow progress and disposition to assess for post hospital care management needs. Notified inpatient TOC of barriers in community prior to hospital.   Please place a [place] Management consult as appropriate and for questions contact:   Natividad Brood, RN BSN [facility] Liaison  [phone] business mobile phone Toll free office [phone]  Fax number: [phone] [email or web address]

## 2019-06-10 NOTE — Progress Notes (Signed)
SATURATION QUALIFICATIONS: (This note is used to comply with regulatory documentation for home oxygen)  Patient Saturations on Room Air at Rest = 84%  Patient Saturations on Room Air while Ambulating = 82%  Patient Saturations on 2 Liters of oxygen while Ambulating = 88%  Patient Saturations on 4 Liters of oxygen while Ambulating = 92%  Please briefly explain why patient needs home oxygen:

## 2019-06-10 NOTE — Progress Notes (Signed)
Progress Note  Patient Name: [name] Date of Encounter: 06/10/2019  Primary Cardiologist: Candee Furbish, MD   Subjective   Breathing is stable. Uses oxygen 24/7.  Inpatient Medications    Scheduled Meds: . aspirin EC  81 mg Oral Daily  . atorvastatin  10 mg Oral Daily  . furosemide  40 mg Intravenous Q12H  . insulin aspart  0-15 Units Subcutaneous TID WC  . metoprolol succinate  12.5 mg Oral Daily  . mometasone-formoterol  2 puff Inhalation BID  . potassium chloride  40 mEq Oral Daily  . sodium chloride flush  3 mL Intravenous Q12H   Continuous Infusions: . sodium chloride     PRN Meds: sodium chloride, acetaminophen, albuterol, ondansetron (ZOFRAN) IV, sodium chloride flush   Vital Signs    Vitals:   06/09/19 1643 06/09/19 2025 06/09/19 2138 06/10/19 0411  BP: 109/68  114/67 (!) 98/53  Pulse: 78 72 73 74  Resp: (!) 22 16 20 20   Temp: 98 F (36.7 C)  99.2 F (37.3 C) 98.6 F (37 C)  TempSrc: Oral  Oral Oral  SpO2: 96% 96% 94% 94%  Weight:    69.4 kg  Height:        Intake/Output Summary (Last 24 hours) at 06/10/2019 0733 Last data filed at 06/10/2019 0700 Gross per 24 hour  Intake 940 ml  Output 1525 ml  Net -585 ml   Last 3 Weights 06/10/2019 06/09/2019 06/08/2019  Weight (lbs) 153 lb 1.6 oz 155 lb 1.6 oz 156 lb  Weight (kg) 69.446 kg 70.353 kg 70.761 kg      Telemetry    Atrial flutter with V pacing - Personally Reviewed  ECG    N/A  Physical Exam   GEN: No acute distress.   Neck: No JVD Cardiac: Irregular, no murmurs, rubs, or gallops.  Respiratory: diminished breath sound at base GI: Soft, nontender, non-distended  MS: No edema; No deformity. Neuro:  Nonfocal  Psych: Normal affect   Labs    High Sensitivity Troponin:   Recent Labs  Lab 06/07/19 1007 06/07/19 1159  TROPONINIHS 44* 72*      Chemistry Recent Labs  Lab 06/07/19 1007  06/09/19 0445 06/10/19 0012 06/10/19 0517  NA 139   < > 139 136 138  K 4.1   < >  3.5 3.9 3.5  CL 103   < > 96* 95* 98  CO2 24   < > 31 31 31   GLUCOSE 226*   < > 151* 278* 176*  BUN 28*   < > 23 25* 20  CREATININE 1.29*   < > 1.00 1.11 1.02  CALCIUM 8.6*   < > 8.2* 8.3* 8.4*  PROT 6.0*  --   --   --   --   ALBUMIN 3.1*  --   --   --   --   AST 66*  --   --   --   --   ALT 53*  --   --   --   --   ALKPHOS 56  --   --   --   --   BILITOT 2.3*  --   --   --   --   GFRNONAA 55*   < > >60 >60 >60  GFRAA >60   < > >60 >60 >60  ANIONGAP 12   < > 12 10 9    < > = values in this interval not displayed.     Hematology Recent  Labs  Lab 06/07/19 1007 06/07/19 2326 06/09/19 0445  WBC 9.0 7.2 7.9  RBC 4.55 4.77 4.26  HGB 10.7* 11.4* 10.2*  HCT 37.9* 38.8* 34.4*  MCV 83.3 81.3 80.8  MCH 23.5* 23.9* 23.9*  MCHC 28.2* 29.4* 29.7*  RDW 22.0* 21.9* 21.5*  PLT 222 221 225    BNP Recent Labs  Lab 06/07/19 1007  BNP 1,011.1*     Cardiac Studies    Echo 04/28/19 1. Left ventricular ejection fraction, by visual estimation, is 25 to 30%. The left ventricle has moderate to severely decreased function. Mildly increased left ventricular size. There is mildly increased left ventricular hypertrophy. 2. Multiple segmental abnormalities exist. See findings. 3. Left ventricular diastolic Doppler parameters are indeterminate pattern of LV diastolic filling. 4. Global right ventricle has moderately reduced systolic function.The right ventricular size is normal. No increase in right ventricular wall thickness. 5. Left atrial size was severely dilated. 6. Right atrial size was severely dilated. 7. Mild aortic valve annular calcification. 8. The mitral valve has been repaired/replaced. There is a 33 mm Medtronic Mosiac prosthesis in position. Trace mitral valve regurgitation. Mean gradient of 5 mmHg suggest mildly stenotic flow. 9. The tricuspid valve is grossly normal. Tricuspid valve regurgitation is mild. 10. The aortic valve is tricuspid Aortic valve regurgitation is  trivial by color flow Doppler. 11. The pulmonic valve was grossly normal. Pulmonic valve regurgitation is not visualized by color flow Doppler. 12. The aortic root was not well visualized. 13. Proximal aorta and arch are not well defined. The aortic root appears dilated. CTA would define course better if clinically indicated. 14. Moderately elevated pulmonary artery systolic pressure. 15. A device wire is visualized. 16. The inferior vena cava is dilated in size with <50% respiratory variability, suggesting right atrial pressure of 15 mmHg. 17. The tricuspid regurgitant velocity is 3.19 m/s, and with an assumed right atrial pressure of 15 mmHg, the estimated right ventricular systolic pressure is moderately elevated at 55.7 mmHg.  Patient Profile     73 y.o. male with a hx ofMV endocarditis c/b severe MR s/p minimally invasive bioprosthetic MVR 2018, moderate pulmonary HTN, non-ischemic cardiomyopathy, aifb s/p nodal ablation (not able to on ACdue to [place]), s/p BI -V-ICD, lung cancer, sinus valsalva aneurysm,HTN, HLD, DM, CVA (2009), COPD on 3L [place] O2, chronic combined CHF with AICD placementwho is being seen  for the evaluation of shortness of breathat the request of Dr. Lorin Mercy.  Assessment & Plan    1. Acute on chronic Combined CHF -Echo in October with EF 10-27% with diastolic dysfunction.  - BNP elevated to >1,000. CXR significant for pulmonary edema, right pleural effusion. - BNP elevated to >1,000. CXR significant for pulmonary edema, right pleural effusion. - Diuresed 4.4L with 3 lb weight loss (156>>153lb). His weight was 155lb when last seen by Dr. Gillian Shields - Coreg changed to Toprol due to COPD - Patient was previously on Valsartan>> was discontinued for hypotension. Resume ARB when stable BP and  Eventual plan to change to Entresto - Will change IV lasix to PO 40mg  BID (home dose) >> may take additional 20mg  for 3lb overnight weight or 5 lb in 1 week or worsening dyspnea - Declined  Palliative care  2. Right pleural effusion - Contributing to dyspnea  3. Elevated troponin - Hs troponin 44>>72. EKG without acute abnormality. Conservative management - he has known 62mm right SOV aneurysm impinging on RVOT by coronary CTA 12/2018 and not a surgical candidate or candidate for PCI in  the future - he is DNR  4. Persistent atrial fib/aflutter - Rate stable. Not a anticoagulate candidate duet to [place] - has BiV/ICD  5. COPD - On oxygen 24/7  For questions or updates, please contact [place] Please consult [email or web address] for contact info under        SignedLeanor Kail, PA  06/10/2019, 7:33 AM

## 2019-06-10 NOTE — Progress Notes (Signed)
Per MD patient stable for d/c. Pt. Has oxygen in his room and per CM concentrator will be deliver today. Spoke with daughter Lynelle Smoke, she is on the way to pick him up.

## 2019-06-10 NOTE — Progress Notes (Signed)
Contacted CT that patient has appropriate IV and that he is ready.

## 2019-06-10 NOTE — Patient Outreach (Signed)
[facility]) Care Management  06/10/2019  [name] 03/11/1946 [id number]   Case closure   No contact to pt over the last several call attempts and no response to the outreach letter sent earlier this month. Pt not hospitalized on high flow O2 and DNR with palliative care involved.   RN spoke with hospital liaison concerning this case. Decided to close however if pt improves via hospital discharge with any case/disease management needs will resume care once again via [facility] services.  Raina Mina, RN Care Management Coordinator [place] Office [phone]

## 2019-06-10 NOTE — TOC Transition Note (Signed)
Transition of Care [facility]) - CM/SW Discharge Note   Patient Details  Name: [name] MRN: [id number] Date of Birth: [birth year]  Transition of Care [facility]) CM/SW Contact:  Zenon Mayo, RN Phone Number: 06/10/2019, 5:07 PM   Clinical Narrative:    Patient for discharge today, NCM spoke with patient and daughter , Lynelle Smoke, she states patient has oxygen with [employer] and he needs a bigger concentrator at home, the one he has only goes up to 2 liters.  NCM contacted [facility] with [employer], MD put in new oxygen orders for 4 liters and ambulatory sats were documented.  Patient also offered choice for HHPT, daughter states since he has oxygen with [employer] she would like [facility].  Referral made to [facility] for [place], she states she is able to take referral.  Soc will begin 24 to 48 hrs post dc.  NCM confirmed with Zack that the oxygen is to be delivered to patient's home tonight.     Final next level of care: [place] Barriers to Discharge: No Barriers Identified   Patient Goals and CMS Choice Patient states their goals for this hospitalization and ongoing recovery are:: go home CMS Medicare.gov Compare Post Acute Care list provided to:: Patient Represenative (must comment)(daughter Tammy)    Discharge Placement                       Discharge Plan and Services                DME Arranged: Oxygen DME Agency: [employer] Date DME Agency Contacted: 06/10/19 Time DME Agency Contacted: [phone] Representative spoke with at DME Agency: zack HH Arranged: PT [place]: [place] ([place]) Date [place]: 06/10/19 Time [place]: [place] Representative spoke with at [place]: valerie  Social Determinants of Health ([place]) Interventions     Readmission Risk Interventions No flowsheet data found.

## 2019-06-10 NOTE — Progress Notes (Signed)
PT Cancellation Note  Patient Details Name: [name] MRN: [id number] DOB: Apr 19, 1946   Cancelled Treatment:    Reason Eval/Treat Not Completed: Medical issues which prohibited therapy  Pt with elevated d dimer and Chest CT ordered to r/o PE.  Will f/u as able after Chest CT results available.    Mikael Spray Leidi Astle 06/10/2019, 1:33 PM

## 2019-06-10 NOTE — Telephone Encounter (Signed)
Handicap Placard form to be filled out-- placed in dr's folder.  Call [phone] upon completion.

## 2019-06-11 ENCOUNTER — Telehealth: Payer: Self-pay | Admitting: *Deleted

## 2019-06-11 ENCOUNTER — Other Ambulatory Visit: Payer: Self-pay | Admitting: Adult Health

## 2019-06-11 MED ORDER — FUROSEMIDE 40 MG PO TABS: 40.0000 mg | ORAL_TABLET | Freq: Two times a day (BID) | ORAL | 3 refills | Status: DC

## 2019-06-11 NOTE — Telephone Encounter (Signed)
Transition Care Management Follow-up Telephone Call   Date discharged? 11.30.2020   How have you been since you were released from the hospital? Fine    Do you understand why you were in the hospital? No " they said I had water on my lungs"    Do you understand the discharge instructions? yes   Where were you discharged to? Home    Items Reviewed:  Medications reviewed: yes  Allergies reviewed: yes  Dietary changes reviewed: no  Referrals reviewed: yes   Functional Questionnaire:  Activities of Daily Living (ADLs):   He states they are independent in the following: ambulation, bathing and hygiene, feeding, continence, grooming, toileting and dressing States they require assistance with the following: N/A   Any transportation issues/concerns?: no   Any patient concerns? no   Confirmed importance and date/time of follow-up visits scheduled yes  Provider Appointment booked with Tommi Rumps 06/13/2019 at [place]  Confirmed with patient if condition begins to worsen call PCP or go to the ER.  Patient was given the office number and encouraged to call back with question or concerns.  : yes

## 2019-06-12 ENCOUNTER — Telehealth: Payer: Self-pay | Admitting: Adult Health

## 2019-06-12 LAB — CULTURE, BLOOD (ROUTINE X 2)
Culture: NO GROWTH
Culture: NO GROWTH
Special Requests: ADEQUATE
Special Requests: ADEQUATE

## 2019-06-12 NOTE — Telephone Encounter (Signed)
[name] notified to proceed with orders.  Nothing further needed.

## 2019-06-12 NOTE — Telephone Encounter (Signed)
Ok for verbal orders ?

## 2019-06-12 NOTE — Telephone Encounter (Signed)
See note

## 2019-06-12 NOTE — Telephone Encounter (Signed)
Home Health Verbal Orders - Caller/Agency: Jerilynn/ [facility] Callback Number: [phone] Requesting OT/PT/Skilled Nursing/Social Work/Speech Therapy: PT Frequency: 1x a week 4 weeks, every other week for 4 weeks

## 2019-06-13 ENCOUNTER — Ambulatory Visit (INDEPENDENT_AMBULATORY_CARE_PROVIDER_SITE_OTHER): Payer: Medicare Other | Admitting: Adult Health

## 2019-06-13 ENCOUNTER — Other Ambulatory Visit: Payer: Self-pay

## 2019-06-13 ENCOUNTER — Encounter: Payer: Self-pay | Admitting: Adult Health

## 2019-06-13 ENCOUNTER — Other Ambulatory Visit: Payer: Self-pay | Admitting: *Deleted

## 2019-06-13 VITALS — BP 100/40 | HR 73 | Temp 98.4°F | Wt 158.0 lb

## 2019-06-13 DIAGNOSIS — I482 Chronic atrial fibrillation, unspecified: Secondary | ICD-10-CM

## 2019-06-13 DIAGNOSIS — J441 Chronic obstructive pulmonary disease with (acute) exacerbation: Secondary | ICD-10-CM

## 2019-06-13 DIAGNOSIS — I5042 Chronic combined systolic (congestive) and diastolic (congestive) heart failure: Secondary | ICD-10-CM | POA: Diagnosis not present

## 2019-06-13 DIAGNOSIS — I10 Essential (primary) hypertension: Secondary | ICD-10-CM | POA: Diagnosis not present

## 2019-06-13 LAB — BASIC METABOLIC PANEL
BUN: 21 mg/dL (ref 6–23)
CO2: 29 mEq/L (ref 19–32)
Calcium: 8.9 mg/dL (ref 8.4–10.5)
Chloride: 101 mEq/L (ref 96–112)
Creatinine, Ser: 1.02 mg/dL (ref 0.40–1.50)
GFR: 71.79 mL/min (ref >60.00)
Glucose, Bld: 179 mg/dL — ABNORMAL HIGH (ref 70–99)
Potassium: 4.6 mEq/L (ref 3.5–5.1)
Sodium: 140 mEq/L (ref 135–145)

## 2019-06-13 LAB — CBC WITH DIFFERENTIAL/PLATELET
Basophils Absolute: 0.1 10*3/uL (ref 0.0–0.1)
Basophils Relative: 1 % (ref 0.0–3.0)
Eosinophils Absolute: 0.2 10*3/uL (ref 0.0–0.7)
Eosinophils Relative: 2.1 % (ref 0.0–5.0)
HCT: 36.2 % — ABNORMAL LOW (ref 39.0–52.0)
Hemoglobin: 10.9 g/dL — ABNORMAL LOW (ref 13.0–17.0)
Lymphocytes Relative: 5.1 % — ABNORMAL LOW (ref 12.0–46.0)
Lymphs Abs: 0.5 10*3/uL — ABNORMAL LOW (ref 0.7–4.0)
MCHC: 30.3 g/dL (ref 30.0–36.0)
MCV: 78.7 fl (ref 78.0–100.0)
Monocytes Absolute: 1 10*3/uL (ref 0.1–1.0)
Monocytes Relative: 10.2 % (ref 3.0–12.0)
Neutro Abs: 8 10*3/uL — ABNORMAL HIGH (ref 1.4–7.7)
Neutrophils Relative %: 81.6 % — ABNORMAL HIGH (ref 43.0–77.0)
Platelets: 231 10*3/uL (ref 150.0–400.0)
RBC: 4.59 Mil/uL (ref 4.22–5.81)
RDW: 22.8 % — ABNORMAL HIGH (ref 11.5–15.5)
WBC: 9.7 10*3/uL (ref 4.0–10.5)

## 2019-06-13 NOTE — Telephone Encounter (Signed)
Form completed.  Pt has an appt today.  Will give at that time.

## 2019-06-13 NOTE — Progress Notes (Signed)
Subjective:    Patient ID: [name], male    DOB: [birth year], 73 y.o.   MRN: [id number]  HPI 73 year old male who  has a past medical history of Atrial septal defect, Automatic implantable cardioverter-defibrillator in situ, Chronic combined systolic and diastolic CHF (congestive heart failure) ([place]), Colon polyps, COPD (chronic obstructive pulmonary disease) ([place]), CVA (cerebral vascular accident) ([place]) (2009), Diabetes mellitus without complication ([place]), Dyslipidemia, Endocarditis, Hypertension, Intracranial hemorrhage ([place]), Lung cancer ([place]) (11/29/2013), Myocardial infarction ([place]) (2010), Pacemaker, Permanent atrial fibrillation, Pneumonia (07/2018), Pulmonary hypertension ([place]), Renal artery stenosis (HCC), 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  He presents to the office today for TCM visit   Admit Date: 06/07/2019 Discharge Date: 06/10/2019  He presented to [facility] emergency room with the complaints of dyspnea x2 days.  While in the emergency room he was found to have acute on chronic respiratory failure due to CHF exacerbation in the setting of moderate pulmonary hypertension and COPD.  His BNP was elevated as was his troponin.  Troponin trended up to 72 from 44.  Was admitted to a telemetry bed.  His echo demonstrated LVEF of 25 to 30% with severely reduced LV function and regional wall motion abnormalities.  Is also atypical akinesis.  The patient was diuresed with Lasix 40 mg IV twice daily.  In the hospital he complained of a transient chest cramp, repeated troponin was lower, D-dimer mildly elevated.  CT angio chest was negative for PE.  His blood pressures were on the low side, as they have been in the past with Coreg, Imdur, and Lasix.  Cardiology  wanted to consider initiation of ACE inhibitor or Entresto  if blood pressures can tolerate.  Coreg was discontinued on discharge  He refused palliative care, as he has in the past.  Today in the office he reports "I am feeling a lot better".  Denies chest pain or shortness of breath past baseline.  10 used to take his medications as directed and has not been taking Coreg while at home.  He is set up with a cardiology appointment tomorrow.  Today he has no acute complaints   Review of Systems  Constitutional: Negative.   HENT: Negative.   Respiratory: Positive for shortness of breath (chronic).   Cardiovascular: Negative.   Gastrointestinal: Negative.   Genitourinary: Negative.   Musculoskeletal: Positive for gait problem.  Skin: Negative.   Psychiatric/Behavioral: Negative.   All other systems reviewed and are negative.  Past Medical History:  Diagnosis Date  . Atrial septal defect    Closed with surgery January, 2010  . Automatic implantable cardioverter-defibrillator in situ    LV dysfunction and pacer needed for AV node lesion  . Chronic combined systolic and diastolic CHF (congestive heart failure) ([place])   . Colon polyps   . COPD (chronic obstructive pulmonary disease) (HCC)    O2- 2 liters, nasal cannula, q night   . CVA (cerebral vascular accident) ([place]) 2009   denies residual on 08/14/2013  . Diabetes mellitus without complication ([place])    type 2  . Dyslipidemia   . Endocarditis    Bacterial, 2009  . Hypertension   . Intracranial hemorrhage (HCC)    Coumadin cannot be used because of the history of his bleed  . Lung cancer ([place]) 11/29/2013   T1N0 Stage Ia non-small cell carcinoma left lung treated with wedge resection  .  Myocardial infarction ([place]) 2010  . Pacemaker    combo pacer and icd  . Permanent atrial fibrillation    Originally Coumadin use for atrial fibrillation  //   he had intracerebral hemorrhage with an INR of 2.3 June, 2009. Anticoagulation could no longer be used.  //  Rapid atrial fibrillation after inferior  MI October, 2010..........Marland Kitchen AV node ablation done at that time with ICD pacemaker placed (EF 35%).   //   Left atrial appendage tied off at the time of mitral valve surgery January, 2010 (maze pro  . Pneumonia 07/2018  . Pulmonary hypertension (HCC)    Moderate  . Renal artery stenosis (HCC)    Mild by history  . 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 Spontaneous pneumothorax    right thoracotomy - distant past  . Status post minimally invasive mitral valve replacement with bioprosthetic valve    33 mm Medtronic Mosaic porcine bioprosthesis placed via right mini thoracotomy for bacterial endocarditis complicated by severe MR and CHF   . Thoracic aortic aneurysm ([place]) 08/11/2016   a - Chest CTA 1/18:  Aneurysmal dilatation of aortic root is noted at 5.1 cm.     Social History   Socioeconomic History  . Marital status: Married    Spouse name: [name]  . Number of children: 0  . Years of education: College  . Highest education level: Not on file  Occupational History  . 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 Financial resource strain: Not on file  . Food insecurity    Worry: Not on file    Inability: Not on file  . Transportation needs    Medical: Not on file    Non-medical: Not on file  Tobacco Use  . Smoking status: Former Smoker    Packs/day: 1.00    Years: 45.00    Pack years: 45.00    Types: Cigarettes    Quit date: 2011    Years since quitting: 9.9  . Smokeless tobacco: Never Used  Substance and Sexual Activity  . Alcohol use: No    Alcohol/week: 0.0 standard drinks    Comment: 08/14/2013 "used to drink beer; quit:in 1982"  . Drug use: No  . Sexual activity: Yes  Lifestyle  . Physical activity    Days per week: Not on file    Minutes per session: Not on file  . Stress: Not on file  Relationships  . Social Herbalist on phone: Not on file    Gets together: Not on file    Attends religious service: Not on  file    Active member of club or organization: Not on file    Attends meetings of clubs or organizations: Not on file    Relationship status: Not on file  . Intimate partner violence    Fear of current or ex partner: Not on file    Emotionally abused: Not on file    Physically abused: Not on file    Forced sexual activity: Not on file  Other Topics Concern  . Not on file  Social History Narrative   Patient lives at home with his spouse.   Caffeine Use: none   Worked for the post [employer]   Has two boys and a girl. All live local.     Past Surgical History:  Procedure Laterality Date  . APPENDECTOMY    . ASD REPAIR, SECUNDUM  07/17/2008  pericardial patch closure of ASD  . AV NODE ABLATION  07/2008   for rapid atrial fib  . CARDIAC CATHETERIZATION    . CARDIAC DEFIBRILLATOR PLACEMENT  ~ [address] [place]  . CARDIAC VALVE REPLACEMENT    . CATARACT EXTRACTION W/ INTRAOCULAR LENS  IMPLANT, BILATERAL Bilateral   . ESOPHAGOGASTRODUODENOSCOPY (EGD) WITH PROPOFOL N/A 09/29/2016   Procedure: ESOPHAGOGASTRODUODENOSCOPY (EGD) WITH PROPOFOL;  Surgeon: [name], MD;  Location: [facility] ENDOSCOPY;  Service: Endoscopy;  Laterality: N/A;  . HERNIA REPAIR    . IMPLANTABLE CARDIOVERTER DEFIBRILLATOR (ICD) GENERATOR CHANGE N/A 02/06/2012   Procedure: ICD GENERATOR CHANGE;  Surgeon: [name], MD;  Location[facility] CATH LAB;  Service: Cardiovascular;  Laterality: N/A;  . INSERT / REPLACE / REMOVE PACEMAKER    . MASS BIOPSY Left    neck mass  . MITRAL VALVE REPLACEMENT Right 07/17/2008   50mm Medtronic Mosaic porcine bioprosthesis  . PENILE PROSTHESIS IMPLANT    . RIGHT HEART CATHETERIZATION N/A 08/16/2013   Procedure: RIGHT HEART CATH;  Surgeon: Josue Hector, MD;  Location: [facility] CATH LAB;  Service: Cardiovascular;  Laterality: N/A;  . TEE WITHOUT CARDIOVERSION N/A 09/06/2016   Procedure: TRANSESOPHAGEAL ECHOCARDIOGRAM (TEE);  Surgeon: Dorothy Spark, MD;  Location: [facility] ENDOSCOPY;  Service:  Cardiovascular;  Laterality: N/A;  . THORACOTOMY Right 1970's   spontaneous pneumothorax - while in the [place]  . TOE SURGERY     left foot hammer toe  . TONSILLECTOMY    . VIDEO ASSISTED THORACOSCOPY (VATS)/WEDGE RESECTION Left 11/29/2013   Procedure: Video assisted thoracoscopy for wedge resection; mini thoracotomy;  Surgeon: [name], MD;  Location: [facility] OR;  Service: Thoracic;  Laterality: Left;    Family History  Problem Relation Age of Onset  . Stomach cancer Father   . Stroke Mother     Allergies  Allergen Reactions  . Anticoagulant Compound Other (See Comments)    Pt had intracranial bleed, therefore all anticoagulation is contraindicated per Dr. Ron Parker  . Other Other (See Comments)    Per Dr. Halford Chessman (Surgeon): stated that the patient cannot be put under for any surgery, as he has an enlarged aorta. He would stand only a 50/50 chance of surviving. He has lung issues, diminished lung tissue, COPD, and emphysema.  . Warfarin Sodium Other (See Comments)    Pt had intracranial bleed, therefore all anticoagulation is contraindicated per Dr. Ron Parker    Current Outpatient Medications on File Prior to Visit  Medication Sig Dispense Refill  . acetaminophen (TYLENOL) 500 MG tablet Take 500-1,000 mg by mouth every 8 (eight) hours as needed for moderate pain or headache.     . albuterol (PROVENTIL HFA) 108 (90 Base) MCG/ACT inhaler Inhale 2 puffs into the lungs every 6 (six) hours as needed for wheezing or shortness of breath.    Marland Kitchen aspirin EC 81 MG tablet Take 1 tablet (81 mg total) by mouth daily. 90 tablet 3  . atorvastatin (LIPITOR) 10 MG tablet Take 10 mg by mouth daily.    . budesonide-formoterol (SYMBICORT) 160-4.5 MCG/ACT inhaler INHALE 2 PUFFS INTO THE LUNGS TWICE A DAY (Patient taking differently: Inhale 2 puffs into the lungs 2 (two) times daily. ) 10.2 Inhaler 6  . furosemide (LASIX) 40 MG tablet Take 1 tablet (40 mg total) by mouth 2 (two) times daily. 60 tablet 3   . metFORMIN (GLUCOPHAGE) 1000 MG tablet Take 1 tablet (1,000 mg total) by mouth 2 (two) times daily with a meal.  10 tablet 0  . metoprolol succinate (TOPROL-XL) 25 MG 24 hr tablet Take 0.5 tablets (12.5 mg total) by mouth daily. 30 tablet 0  . nitroGLYCERIN (NITROSTAT) 0.4 MG SL tablet Place 0.4 mg under the tongue every 5 (five) minutes x 3 doses as needed for chest pain.     . OXYGEN Inhale 2-3 L/min into the lungs continuous.     . potassium chloride SA (KLOR-CON) 20 MEQ tablet Take 2 tablets (40 mEq total) by mouth daily. 30 tablet 0   No current facility-administered medications on file prior to visit.     BP (!) 100/40   Pulse 73   Temp 98.4 F (36.9 C) (Temporal)   Wt 158 lb (71.7 kg)   SpO2 (!) 86%   BMI 20.85 kg/m       Objective:   Physical Exam Vitals signs and nursing note reviewed.  Constitutional:      Appearance: Normal appearance.  Cardiovascular:     Rate and Rhythm: Normal rate and regular rhythm.     Pulses: Normal pulses.     Heart sounds: Normal heart sounds.  Pulmonary:     Effort: Pulmonary effort is normal.     Breath sounds: Decreased breath sounds present.     Comments: 3 l via [place] Abdominal:     General: Abdomen is flat.     Palpations: Abdomen is soft.  Musculoskeletal: Normal range of motion.  Skin:    General: Skin is warm and dry.     Capillary Refill: Capillary refill takes less than 2 seconds.  Neurological:     General: No focal deficit present.     Mental Status: He is alert and oriented to person, place, and time.     Gait: Gait abnormal.     Comments: Walks with cane  Psychiatric:        Mood and Affect: Mood normal.        Behavior: Behavior normal.        Thought Content: Thought content normal.        Judgment: Judgment normal.       Assessment & Plan:  1. Chronic combined systolic and diastolic CHF (congestive heart failure) ([place]) -We reviewed hospital discharge notes, hospital admission notes, labs, imaging, and  procedures.  All answers admission notes, labs, and procedures. All questions answered to the best of my ability  - Follow up with cardiology tomorrow  - Basic Metabolic Panel - CBC with Differential/Platelet  2. Essential hypertension - Continues to be soft w/o Coreg. He is asymptomatic  - Basic Metabolic Panel - CBC with Differential/Platelet  3. Chronic atrial fibrillation (HCC)  - Basic Metabolic Panel - CBC with Differential/Platelet  4. COPD with acute exacerbation ([place])  - Basic Metabolic Panel - CBC with Differential/Platelet   [name], NP

## 2019-06-13 NOTE — Patient Outreach (Signed)
[facility]) Care Management  06/13/2019  [name] October 01, 1945 [id number]    Transition of care/Telephone Assessment/Case Closure (DECLINE)  RN spoke with the spouse and caregiver [name]) who verified identifiers and indicates pt was not available "out driving". States pt is not suppose to be driving. States pt visits his daughter daily from 1:30-4:00 pm and not available. States pt is non adherent to any services at this time. States this is pt's history and he will "just not participate or do anything you ask". Pt currently involved with [facility] and received a  Nurse visit on yesterday. States pt was not engaged although the RN set up PT services.  States pt has refused in-home care and will decline PT services. Caregiver feels [facility] will be "wasting time" awaiting pt to participate as again pt will not engage at all. Spouse has received the discharge summary however did not wish to review all the medications.   Based upon this information from the caregiver RN will close this case from further call attempts for [facility] services. Note this RN previous has this pt but was not able to engage and maintain contact for ongoing follow up. Therefore pt was previously discharged from [facility] services. Once Admitted received another referral with the above information on pt's non adherence with his participation for disease management services.   No further attempts at this time as RN will update provider accordingly.  Raina Mina, RN Care Management Coordinator [place] Office [phone]

## 2019-06-14 ENCOUNTER — Ambulatory Visit (INDEPENDENT_AMBULATORY_CARE_PROVIDER_SITE_OTHER): Payer: Medicare Other | Admitting: Cardiology

## 2019-06-14 ENCOUNTER — Encounter: Payer: Self-pay | Admitting: Cardiology

## 2019-06-14 VITALS — BP 110/50 | HR 74 | Ht 73.0 in | Wt 158.0 lb

## 2019-06-14 DIAGNOSIS — I719 Aortic aneurysm of unspecified site, without rupture: Secondary | ICD-10-CM | POA: Diagnosis not present

## 2019-06-14 DIAGNOSIS — J449 Chronic obstructive pulmonary disease, unspecified: Secondary | ICD-10-CM

## 2019-06-14 DIAGNOSIS — I5042 Chronic combined systolic (congestive) and diastolic (congestive) heart failure: Secondary | ICD-10-CM

## 2019-06-14 DIAGNOSIS — Z95810 Presence of automatic (implantable) cardiac defibrillator: Secondary | ICD-10-CM

## 2019-06-14 DIAGNOSIS — I259 Chronic ischemic heart disease, unspecified: Secondary | ICD-10-CM

## 2019-06-14 DIAGNOSIS — I7121 Aneurysm of the ascending aorta, without rupture: Secondary | ICD-10-CM

## 2019-06-14 NOTE — Progress Notes (Signed)
Cardiology Office Note    Date:  06/14/2019   ID:  [name], DOB 03-Jan-1946, MRN [id number]  PCP:  Charles Peng, NP  Cardiologist:   Charles Furbish, MD (former Dr. Ron [name])    History of Present Illness:  [name] is a 73 y.o. male former Dr. Ron [name] patient here for follow-up of mitral valve disease, bioprosthetic valve in mitral position. as well as atrial fibrillation. He also had AV node ablation, pacemaker. A fibrillation in the past could not be rate controlled. No anticoagulation because of prior CNS bleed. He had prior left atrial appendage occlusion/tied.Therefore, not on chronic anticoagulation.  He was recently admitted 3/16 09/27/15 for near syncope. Orthostatic hypotension was noted. Since his discharge he had been doing well.  Bilirubin has been chronically increased- apparently is having an ultrasound of his liver.  He developed lung cancer and is responded to treatments. Dr. Earlie [name]. Continuous oxygen. Resection.  ICD in place January 2010 several days after his mitral valve surgery. Most recent ejection fraction was 35% in 2015. No CAD 12/8/9 cath.  09/28/17 - no CP, no change in baseline Shortness of breath. O2 at baseline. Doing well. No new complaints.   03/12/2019- here for the follow-up of valvular abnormality.  Was last seen by Dr. Roxy [name] on 12/31/2018- has been quite stable.  Chronic shortness of breath unchanged.  No new complaints.  06/14/2019-here for the follow-up of acute chronic respiratory failure, CHF exacerbation in setting of moderate pulmonary pretension and COPD.  Troponin was 44-72.  BNP was also elevated.  Ejection fraction was 25 to 30% severely reduced EF.  He is DNR status.  He was diuresed with Lasix 40 mg IV twice daily and refused palliative care services in the past although cardiology had felt as though this was appropriate.  Bioprosthetic mitral valve.  Has a BiV defibrillator in place.  Rate controlled atrial fib.  [name] saw  him in the hospital on 06/10/2019.  Carvedilol was changed to Toprol.  Valsartan was previously discontinued to hypotension.  " From [name]: Saw [name] just 10 days ago. Had been admitted about a month ago with acute on chronic systolic HF - felt to be end stage CHF - seen by Palliative care - noted that he has wished to remain a DNR but otherwise wanted to proceed with all other treatment therapies and declined their services."  Seems to be doing quite well although appears chronically ill with chronic O2.  No recent fevers.  Past Medical History:  Diagnosis Date   Atrial septal defect    Closed with surgery January, 2010   Automatic implantable cardioverter-defibrillator in situ    LV dysfunction and pacer needed for AV node lesion   Chronic combined systolic and diastolic CHF (congestive heart failure) (HCC)    Colon polyps    COPD (chronic obstructive pulmonary disease) (HCC)    O2- 2 liters, nasal cannula, q night    CVA (cerebral vascular accident) ([name]) 2009   denies residual on 08/14/2013   Diabetes mellitus without complication ([place])    type 2   Dyslipidemia    Endocarditis    Bacterial, 2009   Hypertension    Intracranial hemorrhage (HCC)    Coumadin cannot be used because of the history of his bleed   Lung cancer ([place]) 11/29/2013   T1N0 Stage Ia non-small cell carcinoma left lung treated with wedge resection   Myocardial infarction [facility]) 2010   Pacemaker    combo pacer  and icd   Permanent atrial fibrillation    Originally Coumadin use for atrial fibrillation  //   he had intracerebral hemorrhage with an INR of 2.3 June, 2009. Anticoagulation could no longer be used.  //  Rapid atrial fibrillation after inferior MI October, 2010..........Marland Kitchen AV node ablation done at that time with ICD pacemaker placed (EF 35%).   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     Past Surgical History:  Procedure Laterality Date   APPENDECTOMY     ASD REPAIR, SECUNDUM  07/17/2008   pericardial patch closure of ASD   AV NODE ABLATION  07/2008   for rapid atrial fib   CARDIAC CATHETERIZATION     CARDIAC DEFIBRILLATOR PLACEMENT  ~ 2010   [place] REPLACEMENT     CATARACT EXTRACTION W/ INTRAOCULAR LENS  IMPLANT, BILATERAL Bilateral    ESOPHAGOGASTRODUODENOSCOPY (EGD) WITH PROPOFOL N/A 09/29/2016   Procedure: ESOPHAGOGASTRODUODENOSCOPY (EGD) WITH PROPOFOL;  Surgeon: [name], MD;  Location: [facility] ENDOSCOPY;  Service: Endoscopy;  Laterality: N/A;   HERNIA REPAIR     IMPLANTABLE CARDIOVERTER DEFIBRILLATOR (ICD) GENERATOR CHANGE N/A 02/06/2012   Procedure: ICD GENERATOR CHANGE;  Surgeon: [name], MD;  Location: [facility] CATH LAB;  Service: Cardiovascular;  Laterality: N/A;   INSERT / REPLACE / REMOVE PACEMAKER     MASS BIOPSY Left    neck mass   MITRAL VALVE REPLACEMENT Right 07/17/2008   55mm Medtronic Mosaic porcine bioprosthesis   PENILE PROSTHESIS IMPLANT     RIGHT HEART CATHETERIZATION N/A 08/16/2013   Procedure: RIGHT HEART CATH;  Surgeon: Josue Hector, MD;  Location: [facility] C[facility] LAB;  Service: Cardiovascular;  Laterality: N/A;   TEE WITHOUT CARDIOVERSION N/A 09/06/2016   Procedure: TRANSESOPHAGEAL ECHOCARDIOGRAM (TEE);  Surgeon: Dorothy Spark, MD;  Location: [facility] ENDOSCOPY;  Service: Cardiovascular;  Laterality: N/A;    THORACOTOMY Right 1970's   spontaneous pneumothorax - while in the [place]     left foot hammer toe   TONSILLECTOMY     VIDEO ASSISTED THORACOSCOPY (VATS)/WEDGE RESECTION Left 11/29/2013   Procedure: Video assisted thoracoscopy for wedge resection; mini thoracotomy;  Surgeon: [name], MD;  Location: [place];  Service: Thoracic;  Laterality: Left;    Outpatient Medications Prior to Visit  Medication Sig Dispense Refill   acetaminophen (TYLENOL) 500 MG tablet Take 500-1,000 mg by mouth every 8 (eight) hours as needed for moderate pain or headache.      albuterol (PROVENTIL HFA) 108 (90 Base) MCG/ACT inhaler Inhale 2 puffs into the lungs every 6 (six) hours as needed for wheezing or shortness of breath.     aspirin EC 81 MG tablet Take 1 tablet (81 mg total) by mouth daily. 90 tablet 3   atorvastatin (LIPITOR) 10  MG tablet Take 10 mg by mouth daily.     budesonide-formoterol (SYMBICORT) 160-4.5 MCG/ACT inhaler INHALE 2 PUFFS INTO THE LUNGS TWICE A DAY 10.2 Inhaler 6   furosemide (LASIX) 40 MG tablet Take 1 tablet (40 mg total) by mouth 2 (two) times daily. 60 tablet 3   metFORMIN (GLUCOPHAGE) 1000 MG tablet Take 1 tablet (1,000 mg total) by mouth 2 (two) times daily with a meal. 10 tablet 0   metoprolol succinate (TOPROL-XL) 25 MG 24 hr tablet Take 0.5 tablets (12.5 mg total) by mouth daily. 30 tablet 0   nitroGLYCERIN (NITROSTAT) 0.4 MG SL tablet Place 0.4 mg under the tongue every 5 (five) minutes x 3 doses as needed for chest pain.      OXYGEN Inhale 2-3 L/min into the lungs continuous.      potassium chloride SA (KLOR-CON) 20 MEQ tablet Take 2 tablets (40 mEq total) by mouth daily. 30 tablet 0   No facility-administered medications prior to visit.      Allergies:   Anticoagulant compound, Other, and Warfarin sodium   Social History   Socioeconomic History   Marital status: Married    Spouse name: [name]   Number of children: 0   Years of  education: [employer] education level: Not on file  Occupational History   Occupation: Retired    Fish farm manager: RETIRED    Comment: Acupuncturist strain: Not on file   Food insecurity    Worry: Not on file    Inability: Not on Lexicographer needs    Medical: Not on file    Non-medical: Not on file  Tobacco Use   Smoking status: Former Smoker    Packs/day: 1.00    Years: 45.00    Pack years: 45.00    Types: Cigarettes    Quit date: 2011    Years since quitting: 9.9   Smokeless tobacco: Never Used  Substance and Sexual Activity   Alcohol use: No    Alcohol/week: 0.0 standard drinks    Comment: 08/14/2013 "used to drink beer; quit:in 1982"   Drug use: No   Sexual activity: Yes  Lifestyle   Physical activity    Days per week: Not on file    Minutes per session: Not on file   Stress: Not on file  Relationships   Social connections    Talks on phone: Not on file    Gets together: Not on file    Attends religious service: Not on file    Active member of club or organization: Not on file    Attends meetings of clubs or organizations: Not on file    Relationship status: Not on file  Other Topics Concern   Not on file  Social History Narrative   Patient lives at home with his spouse.   Caffeine Use: none   Worked for the [employer]   Has two boys and a girl. All live local.      Family History:  The patient's family history includes Stomach cancer in his father; Stroke in his mother.   ROS:   Please see the history of present illness.    Review of Systems  All other systems reviewed and are negative.      PHYSICAL EXAM:   VS:  BP (!) 110/50    Pulse 74    Ht 6\' 1"  (1.854 m)    Wt 158 lb (71.7 kg)  SpO2 92%    BMI 20.85 kg/m    GEN: Elderly well nourished, well developed, in no acute distress  HEENT: normal , Oxygen in place Neck: no JVD, carotid bruits, or masses Cardiac: RRR; soft systolic murmur ,  no rubs, or gallops,no edema  Respiratory:  clear to auscultation bilaterally, normal work of breathing GI: soft, nontender, nondistended, + BS MS: no deformity or atrophy  Skin: warm and dry, no rash Neuro:  Alert and Oriented x 3, Strength and sensation are intact Psych: euthymic mood, full affect    Wt Readings from Last 3 Encounters:  06/14/19 158 lb (71.7 kg)  06/13/19 158 lb (71.7 kg)  06/10/19 153 lb 1.6 oz (69.4 kg)      Studies/Labs Reviewed:   EKG:  EKG is not ordered today.    Recent Labs: 04/12/2019: TSH 3.37 05/02/2019: Magnesium 2.4 06/07/2019: ALT 53; B Natriuretic Peptide 1,011.1 06/13/2019: BUN 21; Creatinine, Ser 1.02; Hemoglobin 10.9; Platelets 231.0; Potassium 4.6; Sodium 140   Lipid Panel    Component Value Date/Time   CHOL 85 04/12/2019 0901   TRIG 95.0 04/12/2019 0901   HDL 45.30 04/12/2019 0901   CHOLHDL 2 04/12/2019 0901   VLDL 19.0 04/12/2019 0901   LDLCALC 20 04/12/2019 0901   LDLDIRECT 80.2 03/31/2011 1411    Additional studies/ records that were reviewed today include:  Prior office notes reviewed, lab work reviewed  ECHO 09/06/16: - Left ventricle: Systolic function was mildly reduced. The   estimated ejection fraction was in the range of 45% to 50%. Wall   motion was normal; there were no regional wall motion   abnormalities. - Mitral valve: S/P MVR with bioprosthetic valve with 33 mm   Medtronic Mosaic porcine bioprosthesis. There is no central   mitral regurgitation or paravalvular leak. There is limited   motion of the posterior leaflet. Mean transmitral gradient is 7   mmHg.   Valve area by pressure half-time: 1.26 cm^2. - Left atrium: The atrium was massively dilated. No evidence of   thrombus in the atrial cavity or appendage but heavy smoke seen   in the left atrium. The appendage was multilobulated and large.   Emptying velocity was decreased. - Right atrium: The atrium was dilated. No evidence of thrombus in   the atrial  cavity or appendage.  Impressions:  - Aortic valve leaflets are structurally normal, with normal   coaptation and no regurgitation.   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 The pseudoaneurysm measures 32 x 25   mm. The communication is seen on both 2D (image 21, 22) and 3D   images (image 26).     S/P MVR with bioprosthetic valve with 33 mm Medtronic Mosaic   porcine bioprosthesis. There is no central mitral regurgitation   or paravalvular leak. There is limited motion of the posterior   leaflet however anterior leaflet opens very well. Mean   transmitral gradient is 7 mmHg that is most probably attributed   to prosthesis/patient mismatch. There is no significant stenosis.  ECHO 08/01/18: - Left ventricle: The cavity size was normal. There was mild   concentric hypertrophy. Systolic function was moderately reduced.   The estimated ejection fraction was in the range of 35% to 40%.   Diffuse hypokinesis. Akinesis of the apical myocardium. - Aortic valve: Transvalvular velocity was within the normal range.   There was no stenosis. There was mild regurgitation. - Aorta: There was dilation of  the sinus(es) of Valsalva. Aortic   root dimension: 47 mm (ED). Ascending aortic diameter: 42.1 mm   (S). - Mitral valve: A 52mm Medtronic Mosaic bioprosthesis was present.   Transvalvular velocity was within the normal range. There was no   evidence for stenosis. There was trivial regurgitation. Pressure   half-time: 170 ms. Mean gradient (D): 10 mm Hg. Valve area by   pressure half-time: 1.29 cm^2. Valve area by continuity equation   (using LVOT flow): 1.33 cm^2. - Left atrium: The atrium was severely dilated. - Right ventricle: The cavity size was normal. Wall thickness was   normal. Systolic function was moderately reduced. - Right atrium: The atrium was severely dilated. - Tricuspid valve: There was trivial regurgitation. - Pulmonary  arteries: Systolic pressure was mildly to moderately   increased. PA peak pressure: 49 mm Hg (S).  Impressions:  - Bioprosthetic mitral valve mean gradient has increased from 7   mmHg in 08/2016 to 10 mmHg. Based on the DVI and PHT, findings are   most consistent with patient prosthesis mismatch. There is an   aortic root aneurysm that is unchanged from 07/2016.   ECHO 06/07/19:    1. Left ventricular ejection fraction, by visual estimation, is 25 to 30%. The left ventricle has severely decreased function. There is no left ventricular hypertrophy.  2. Aortic root aneurysm present up to 62 mm with dilated of the R sinus of valsalva. Appears to be stable from most recent cardiac CTA.  3. A 33 mm metronic mosaic bioprosthetic mitral valve is present. The mean gradient across the valve is 5 mmHg at 74 bpm. EOA by VTI is 1.4 cm2. There is trace regurgitation which is likely paravalvular. Gradients stable from prior study.  4. Abnormal septal motion consistent with post-operative status.  5. Left ventricular diastolic function could not be evaluated.  6. Mildly dilated left ventricular internal cavity size.  7. The left ventricle demonstrates regional wall motion abnormalities.  8. Apical akinesis present.  9. Global right ventricle has severely reduced systolic function.The right ventricular size is mildly enlarged. No increase in right ventricular wall thickness. 10. Left atrial size was severely dilated. 11. Right atrial size was severely dilated. 12. The mitral valve has been repaired/replaced. Trace mitral valve regurgitation. 13. The tricuspid valve is grossly normal. Tricuspid valve regurgitation is trivial. 14. The aortic valve is tricuspid. Aortic valve regurgitation is not visualized. No evidence of aortic valve sclerosis or stenosis. 15. The pulmonic valve was grossly normal. Pulmonic valve regurgitation is not visualized. 16. Aneurysm of the aortic root, measuring 62 mm. 17.  Aortic dilatation noted. 18. TR signal is inadequate for assessing pulmonary artery systolic pressure. 19. A pacer wire is visualized in the RA and RV. 20. The inferior vena cava is dilated in size with <50% respiratory variability, suggesting right atrial pressure of 15 mmHg.   Cardiac CT Cardiac CTA  MEDICATIONS: Sub lingual nitro. 4mg  and lopressor 5mg   TECHNIQUE: The patient was scanned on a Siemens Force 277 slice scanner. Gantry rotation speed was 250 msecs. Collimation was .6 mm. A 100 kV prospective scan was triggered in the ascending thoracic aorta at 140 HU's Full mA was used between 35% and 75% of the R-R interval. Average HR during the scan was 74 bpm. The 3D data set was interpreted on a dedicated work station using MPR, MIP and VRT modes. A total of 80 cc of contrast was used.  FINDINGS: Aorta: Normal aortic root with bovine arch.  STJ:  33 mm  Aortic Root: 34 mm  Arch: 29 mm  Descending Thoracic aorta: 25 mm  There is a large right sinus of Valsalva aneurysm with impingement of the RVOT. The RCA ostium is above the aneurysm and displaced cephalad  Right Sinus: 61 mm  Left Sinus: 45 mm  Non Sinus: 49 mm  The patient is s/o MVR with 33 mm bioprosthetic Mosaic Medtronic valve. Sewing ring intact  The LV apex is thinned and aneurysmal with no mural thrombus  Pacing wires are seen in the RA/RV  There is severe biatrial enlargement with no LAA thrombus  IMPRESSION: 1. Large right sinus of Valsalva aneurysm measuring 61 mm and impinging on RVOT. RCA emanates above aneurysm and is displaced cephaled  2. Severe bi atrial enlargement with no LAA thrombus  3. MVR Medtronic 33 mm Mosaic bioprosthesis with intact sewing ring  4. Thinned and aneurysmal LV apex no mural thrombus  5. Pacing wires in RA/RV  6. Normal aortic root with bovine arch no dissection noted  Findings are similar to CT done on 10/17/17  [name]  ASSESSMENT:    1. Chronic combined systolic and diastolic CHF (congestive heart failure) ([place])   2. COPD GOLD II   3. Aortic root aneurysm ([place])   4. ICD (implantable cardioverter-defibrillator), biventricular, in situ      PLAN:  In order of problems listed above:  1. Sinus of Valsalva aneurysm, right, no changes on CT scan.  Reviewed.  Once again, would be high risk for any surgical procedure.  At this time continue with palliative care management.  Continue to monitor, appreciate Dr. Roxy [name].  2. Permament atrial fibrillation-Overall no symptoms. Unfortunately cannot anticoagulate secondary to history of intracranial hemorrhage, however had LAA clipped with surgery. Had rapid atrial fibrillation with inferior MI in October 2010 rate control was always an issue therefore he went AV nodal ablation with Bi-V-ICD placement. He is pacemaker dependent.  We will not turn off defibrillation therapy.  Apparently this has been discussed with palliative care team in the hospital setting. 3. Chronic systolic heart failure-ejection fraction 35%. Doing well, no changes. Meds reviewed.  No changes made.  Unable to add any further medication such as ARB.  Continue with Lasix.  Blood pressure has been soft in the past. 4. Orthostatic hypotension-hospitalized previously for this. Improved. No recurrence.  Watch blood pressure. 5. Knows to take antibiotics prior to dental work. Had previous bacterial endocarditis, mitral valve replacement as above coronary arteries were normal prior to valve surgery. 2010. No changes 6. Prior intracranial bleed occurred in 2009 with INR of 2.3. After that point, he was no longer anticoagulated. Once again LAA clipped.  No changes 7. Has a history of stage IA non-small cell lung carcinoma left wedge resection. Home oxygen.  Wearing under mask 8. Solar purpura - asa. Noted on exam.  No changes 9. Elevated bilirubin-has been evaluated.  Stable.    Medication  Adjustments/Labs and Tests Ordered: Current medicines are reviewed at length with the patient today.  Concerns regarding medicines are outlined above.  Medication changes, Labs and Tests ordered today are listed in the Patient Instructions below. Patient Instructions  Medication Instructions:    Your physician recommends that you continue on your current medications as directed. Please refer to the Current Medication list given to you today.  *If you need a refill on your cardiac medications before your next appointment, please call your pharmacy*    Follow-Up:  Your physician wants you to  follow-up in: [place] NP You will receive a reminder letter in the mail two months in advance. If you don't receive a letter, please call our office to schedule the follow-up appointment.   At [facility], you and your health needs are our priority.  As part of our continuing mission to provide you with exceptional heart care, we have created designated Provider Care Teams.  These Care Teams include your primary Cardiologist (physician) and Advanced Practice Providers (APPs -  Physician Assistants and Nurse Practitioners) who all work together to provide you with the care you need, when you need it.  Your next appointment:   12 month(s)  The format for your next appointment:   In Person  Provider:   Candee Furbish, MD         Signed, Charles Furbish, MD  06/14/2019 12:17 PM    [name], [place], [place]  [address] Phone: [phone]; Fax: [phone]

## 2019-06-14 NOTE — Patient Instructions (Signed)
Medication Instructions:    Your physician recommends that you continue on your current medications as directed. Please refer to the Current Medication list given to you today.  *If you need a refill on your cardiac medications before your next appointment, please call your pharmacy*    Follow-Up:  Your physician wants you to follow-up in: [place] NP You will receive a reminder letter in the mail two months in advance. If you don't receive a letter, please call our office to schedule the follow-up appointment.   At [facility], you and your health needs are our priority.  As part of our continuing mission to provide you with exceptional heart care, we have created designated Provider Care Teams.  These Care Teams include your primary Cardiologist (physician) and Advanced Practice Providers (APPs -  Physician Assistants and Nurse Practitioners) who all work together to provide you with the care you need, when you need it.  Your next appointment:   12 month(s)  The format for your next appointment:   In Person  Provider:   Candee Furbish, MD

## 2019-06-19 ENCOUNTER — Other Ambulatory Visit: Payer: Self-pay | Admitting: Adult Health

## 2019-06-19 DIAGNOSIS — I10 Essential (primary) hypertension: Secondary | ICD-10-CM

## 2019-06-19 DIAGNOSIS — E119 Type 2 diabetes mellitus without complications: Secondary | ICD-10-CM

## 2019-06-20 NOTE — Telephone Encounter (Signed)
Valsartan was d/c during a recent hospital admission. He should not be taking this    Ok for Metformin x 3 months

## 2019-06-20 NOTE — Telephone Encounter (Signed)
Message sent to the pharmacy to d/c valsartan.  Metformin filled for 3 months.

## 2019-06-24 ENCOUNTER — Other Ambulatory Visit: Payer: Self-pay | Admitting: Adult Health

## 2019-06-24 NOTE — Telephone Encounter (Signed)
Requested medication (s) are due for refill today: yes  Requested medication (s) are on the active medication list: yes  Last refill:  06/11/2019  Future visit scheduled: No  Notes to clinic:  last ordered after hospital encounter Dr Tyrell Antonio    Requested Prescriptions  Pending Prescriptions Disp Refills   potassium chloride SA (KLOR-CON) 20 MEQ tablet 30 tablet 0    Sig: Take 2 tablets (40 mEq total) by mouth daily.      Endocrinology:  Minerals - Potassium Supplementation Passed - 06/24/2019  2:05 PM      Passed - K in normal range and within 360 days    Potassium  Date Value Ref Range Status  06/13/2019 4.6 3.5 - 5.1 mEq/L Final  12/20/2016 5.1 3.5 - 5.1 mEq/L Final          Passed - Cr in normal range and within 360 days    Creatinine  Date Value Ref Range Status  03/25/2019 1.24 0.61 - 1.24 mg/dL Final  12/20/2016 1.2 0.7 - 1.3 mg/dL Final   Creatinine, Ser  Date Value Ref Range Status  06/13/2019 1.02 0.40 - 1.50 mg/dL Final   Creatinine,U  Date Value Ref Range Status  01/22/2015 68.2 mg/dL Final          Passed - Valid encounter within last 12 months    Recent Outpatient Visits           1 week ago Chronic combined systolic and diastolic CHF (congestive heart failure) ([place])   Therapist, music at [facility], NP   1 month ago Chronic combined systolic and diastolic CHF (congestive heart failure) ([place])   Therapist, music at [place], NP   2 months ago Type 2 diabetes mellitus without complication, without long-term current use of insulin ([place])   Therapist, music at [place], NP   5 months ago Type 2 diabetes mellitus without complication, without long-term current use of insulin ([place])   Therapist, music at [place], NP   10 months ago Pneumonia of left lower lobe due to infectious organism ([place])   Therapist, music at [facility], [place], NP       Future Appointments           In 1 month Gerhardt, Marlane Hatcher, NP [place], [facility]

## 2019-06-24 NOTE — Telephone Encounter (Signed)
Medication Refill - Medication:  potassium chloride SA (KLOR-CON) 20 MEQ tablet  Has the patient contacted their pharmacy? Yes advised to call office.   Preferred Pharmacy (with phone number or street name):  [id number] - [place], [place] Phone:  [phone]  Fax:  [phone]     Agent: Please be advised that RX refills may take up to 3 business days. We ask that you follow-up with your pharmacy.

## 2019-06-25 ENCOUNTER — Other Ambulatory Visit: Payer: Self-pay | Admitting: Adult Health

## 2019-06-25 NOTE — Telephone Encounter (Signed)
potassium chloride SA (KLOR-CON) 20 MEQ tablet  [id number] [place],  - [place] Phone:  [phone]  Fax:  [phone]     Pt needs this asap out, had hospital visit FU on 12/3 states he really needs this ... script written while in hospital, if can not FU with pt pls

## 2019-06-25 NOTE — Telephone Encounter (Signed)
Requested medication (s) are due for refill today: yes  Requested medication (s) are on the active medication list: yes  Last refill:  05/31/2019  Future visit scheduled: no  Notes to clinic: patient was prescribed medication in the hospital and need refill asap   Requested Prescriptions  Pending Prescriptions Disp Refills   potassium chloride SA (KLOR-CON) 20 MEQ tablet 30 tablet 0    Sig: Take 2 tablets (40 mEq total) by mouth daily.      Endocrinology:  Minerals - Potassium Supplementation Passed - 06/25/2019 10:03 AM      Passed - K in normal range and within 360 days    Potassium  Date Value Ref Range Status  06/13/2019 4.6 3.5 - 5.1 mEq/L Final  12/20/2016 5.1 3.5 - 5.1 mEq/L Final          Passed - Cr in normal range and within 360 days    Creatinine  Date Value Ref Range Status  03/25/2019 1.24 0.61 - 1.24 mg/dL Final  12/20/2016 1.2 0.7 - 1.3 mg/dL Final   Creatinine, Ser  Date Value Ref Range Status  06/13/2019 1.02 0.40 - 1.50 mg/dL Final   Creatinine,U  Date Value Ref Range Status  01/22/2015 68.2 mg/dL Final          Passed - Valid encounter within last 12 months    Recent Outpatient Visits           1 week ago Chronic combined systolic and diastolic CHF (congestive heart failure) ([place])   Therapist, music at [place], NP   1 month ago Chronic combined systolic and diastolic CHF (congestive heart failure) ([place])   Therapist, music at [place], NP   2 months ago Type 2 diabetes mellitus without complication, without long-term current use of insulin ([place])   Therapist, music at [place], NP   5 months ago Type 2 diabetes mellitus without complication, without long-term current use of insulin ([place])   Therapist, music at [place], NP   10 months ago Pneumonia of left lower lobe due to infectious organism ([place])   Therapist, music at [employer], [place], NP       Future  Appointments             In 1 month Gerhardt, Marlane Hatcher, NP [place], [facility]

## 2019-06-26 MED ORDER — POTASSIUM CHLORIDE CRYS ER 20 MEQ PO TBCR: 40.0000 meq | EXTENDED_RELEASE_TABLET | Freq: Every day | ORAL | 1 refills | Status: DC

## 2019-06-26 NOTE — Progress Notes (Signed)
Remote ICD transmission.   

## 2019-06-26 NOTE — Telephone Encounter (Signed)
FILLED TODAY IN A DIFFERENT ENCOUNTER.

## 2019-07-23 IMAGING — CT CT CHEST W/ CM
2 of 4 series · 15 of 36 positions shown, 18 images · IV contrast (omnipaque)
Comparison: CT chest 12/21/2016.

CLINICAL DATA: Restaging lung cancer.

EXAM:
CT CHEST WITH CONTRAST
TECHNIQUE: Multidetector CT imaging of the chest was performed during
intravenous contrast administration.
CONTRAST:  75mL OMNIPAQUE IOHEXOL 300 MG/ML  SOLN

[Series 2: axial st · axial · 0.88mm/px · z∈[-428,-116]mm · 12 of 182 slices shown, 15 images]
[im 13/182  mediastinal]
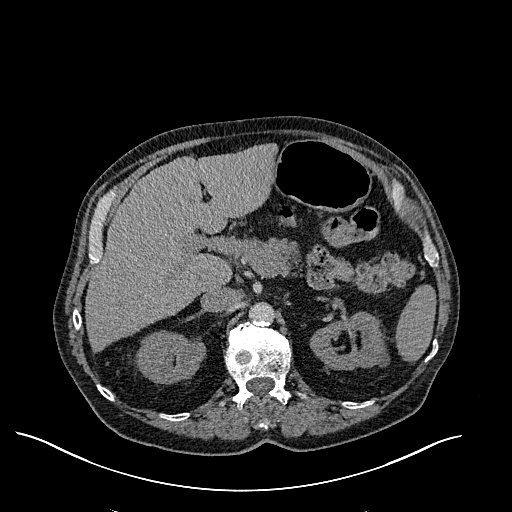
[im 13/182  lung]
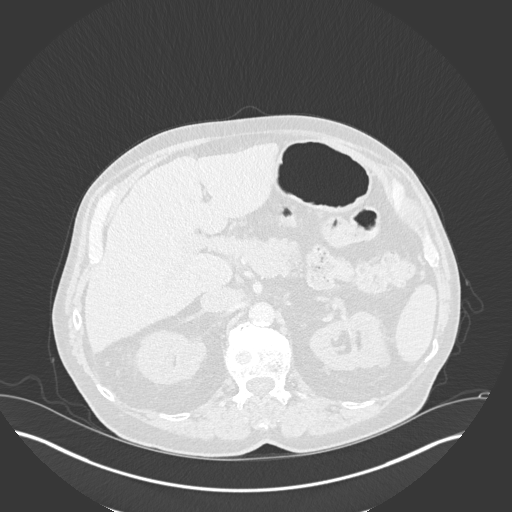
[im 26/182  lung]
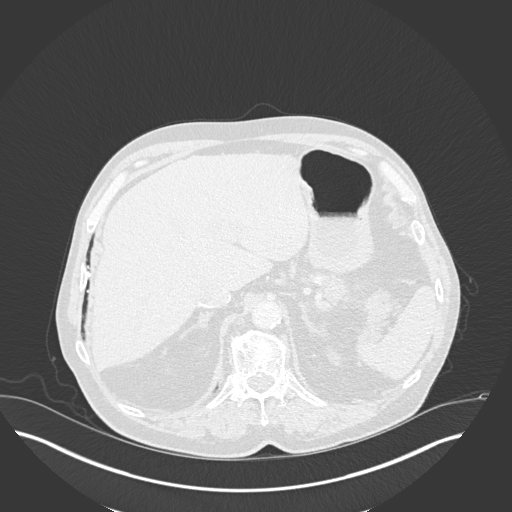
[im 39/182  lung]
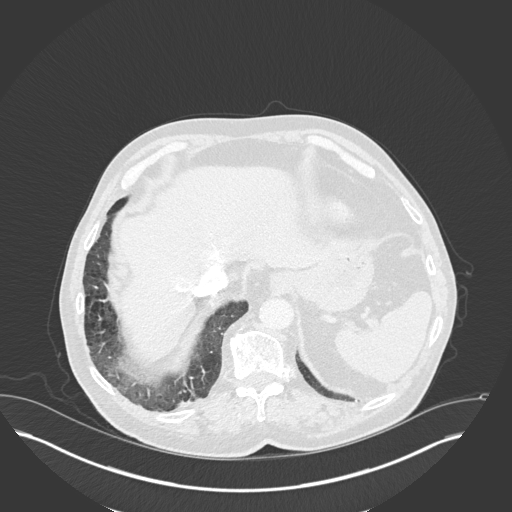
[im 52/182  lung]
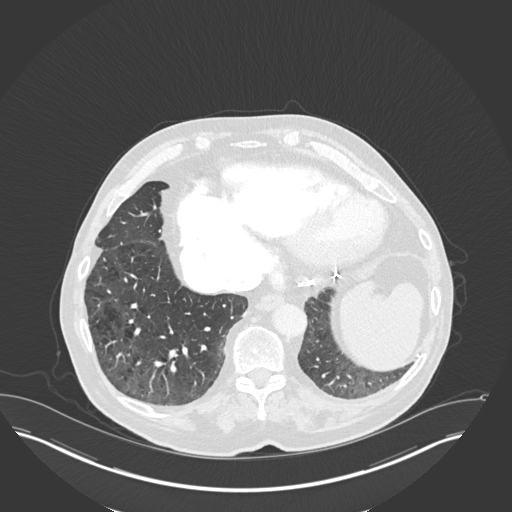
[im 65/182  mediastinal]
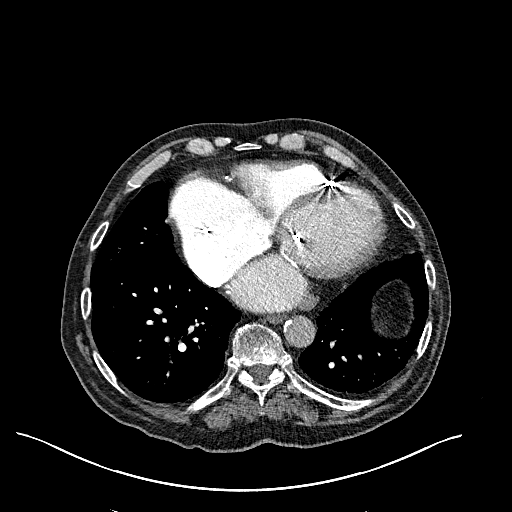
[im 65/182  lung]
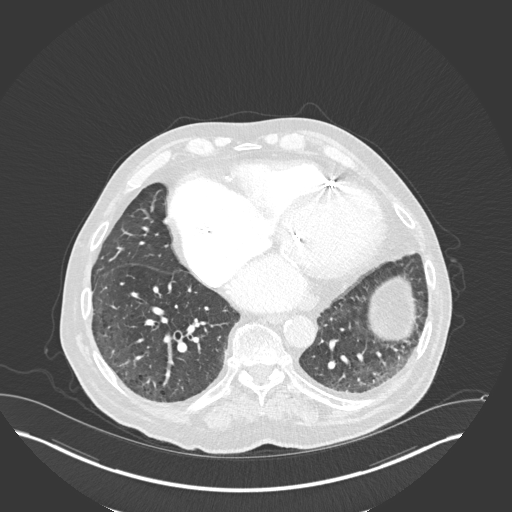
[im 78/182  lung]
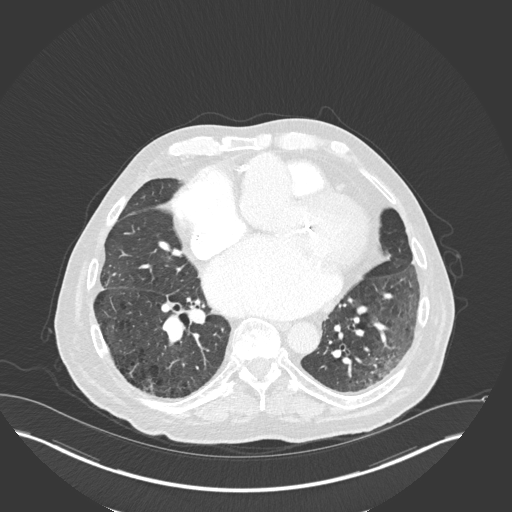
[im 104/182  lung]
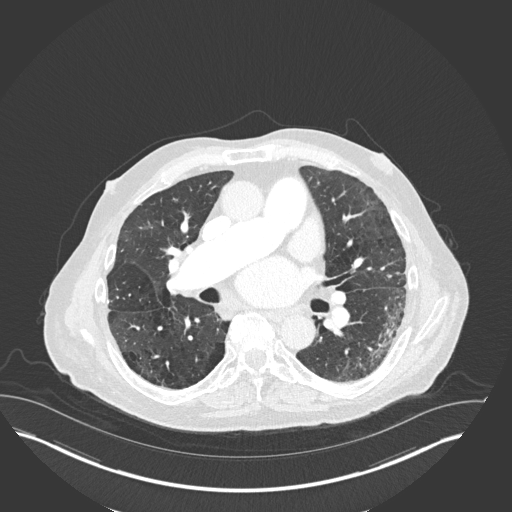
[im 117/182  lung]
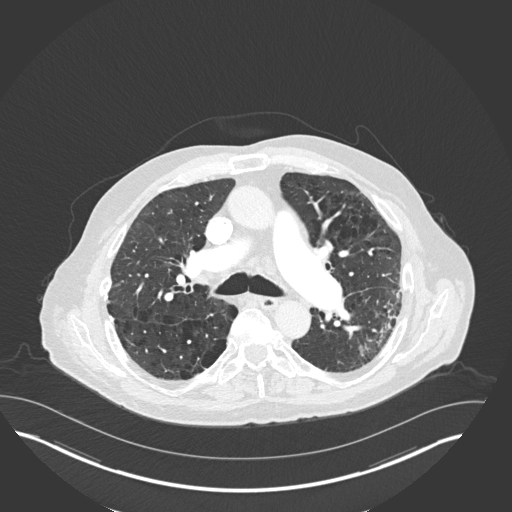
[im 130/182  mediastinal]
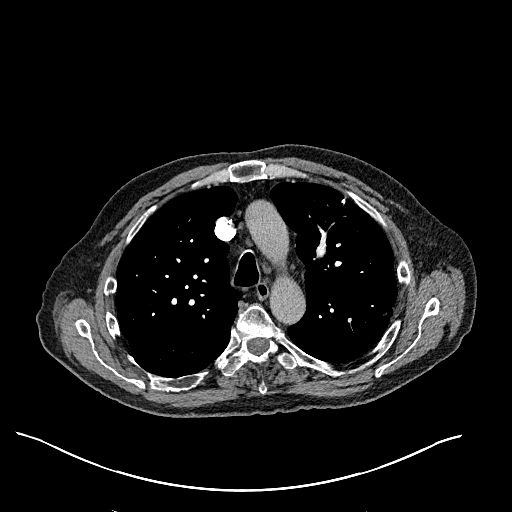
[im 130/182  lung]
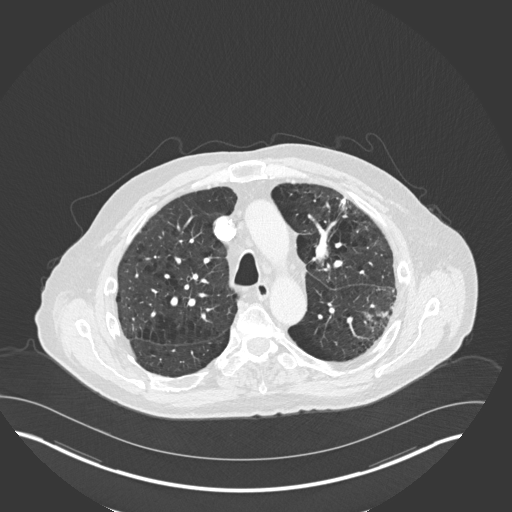
[im 143/182  lung]
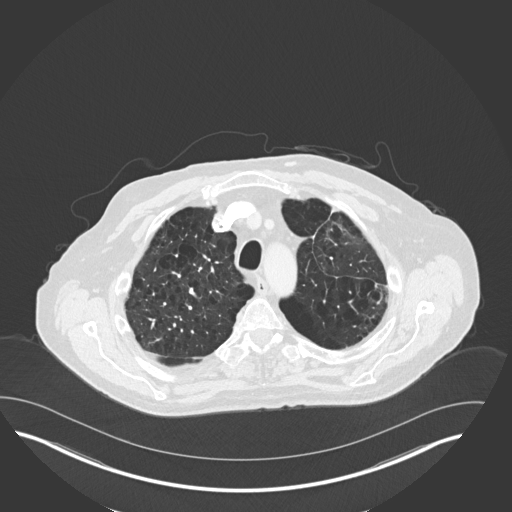
[im 156/182  lung]
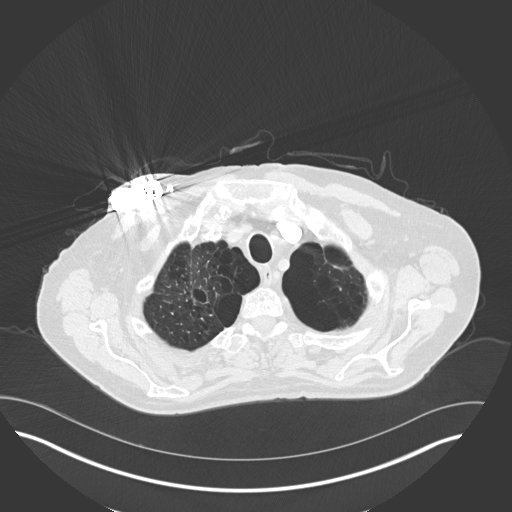
[im 169/182  lung]
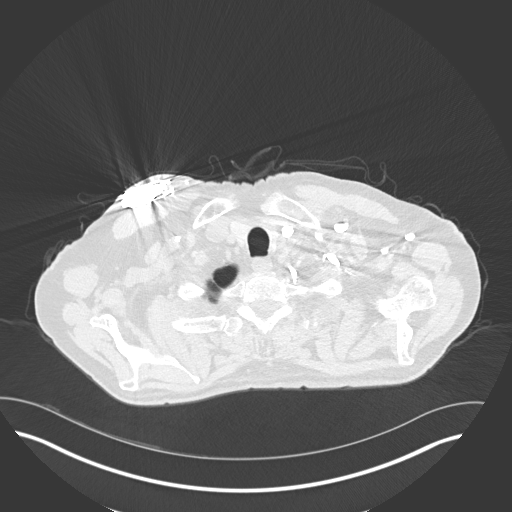

[Series 5: coronal · coronal · 0.79mm/px · 3 of 135 slices shown]
[im 27/135  lung]
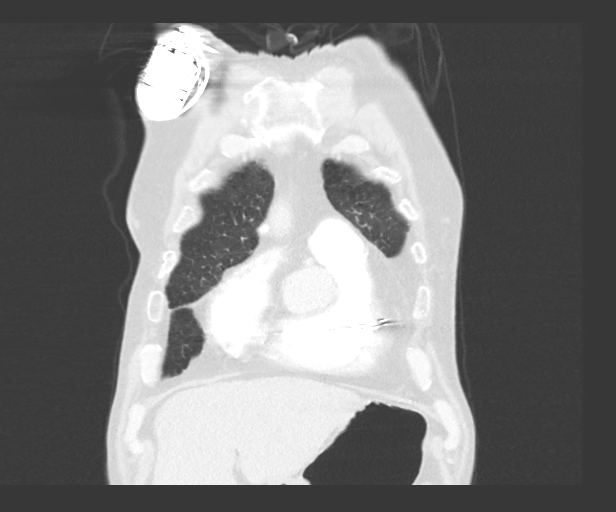
[im 54/135  lung]
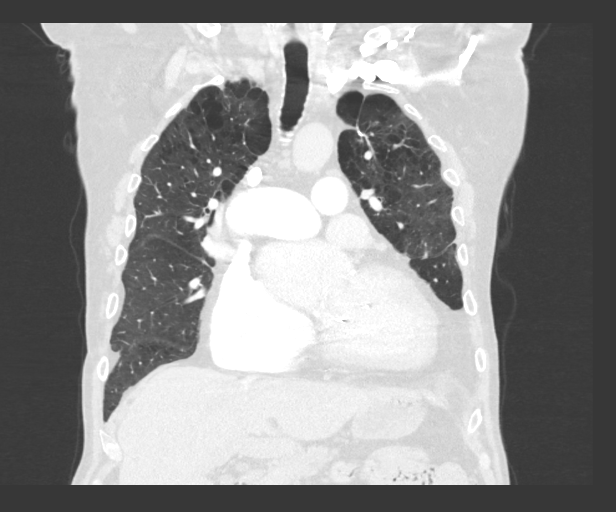
[im 81/135  lung]
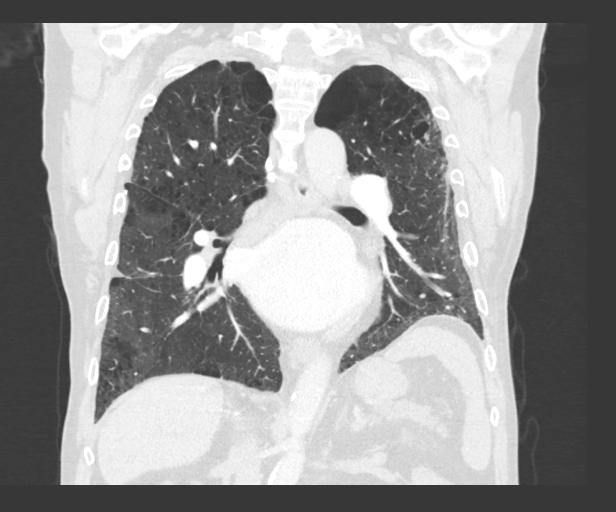

[15 of 36 positions shown; findings below may reference images not displayed]

FINDINGS: Cardiovascular: Cardiac enlargement noted. Calcifications in the
LAD, left circumflex, left main and RCA coronary arteries noted.
Aortic atherosclerosis identified. Enlargement of the pulmonary
arteries again noted suggestive of PA hypertension.

Mediastinum/Nodes: No enlarged hilar, or axillary lymph nodes.
Stable sub- carinal lymph node measuring 1.7 cm, image 74/2. Right
hilar lymph node measures 1.4 cm, image 75/2. Unchanged from
previous exam. Thyroid gland, trachea, and esophagus demonstrate no
significant findings.

Lungs/Pleura: Bullous emphysema identified. No pleural effusions,
airspace consolidation or pneumothorax. Status post prior partial
lobectomy/wedge resection from the left upper lobe. Stable 3 mm left
upper lobe lung nodule, image 46/2. Scattered calcified granulomas
identified in the left lower lobe. Right upper lobe and right middle
lobe granulomas again noted. No suspicious pulmonary nodules or
masses identified.

Upper Abdomen: There the contour the liver appears irregular. The
lateral segment of left lobe of liver appears slightly enlarged. No
focal scratch set no acute abnormality noted within the upper
abdomen.

Musculoskeletal: Multi level spondylosis identified within the
thoracic spine. No suspicious bone lesions identified.
IMPRESSION: 1. Stable exam. No evidence for recurrent tumor or metastatic
disease.
2. Stable borderline enlarged mediastinal and right hilar lymph
nodes.
3. Aortic Atherosclerosis (1IND5-1KJ.J) and Emphysema (1IND5-E8C.4).
4. Coronary artery calcifications
5. Morphologic features of liver suggestive of cirrhosis.
6. Cardiac enlargement
7. Enlarged pulmonary artery suggestive of PA hypertension.

## 2019-07-24 ENCOUNTER — Ambulatory Visit: Payer: Medicare Other

## 2019-07-30 ENCOUNTER — Ambulatory Visit (HOSPITAL_COMMUNITY)
Admission: RE | Admit: 2019-07-30 | Discharge: 2019-07-30 | Disposition: A | Discharge status: Home / Self Care | Payer: Medicare Other | Source: Ambulatory Visit | Attending: Internal Medicine | Admitting: Internal Medicine

## 2019-07-30 ENCOUNTER — Inpatient Hospital Stay: Payer: Medicare Other | Attending: Physician Assistant

## 2019-07-30 ENCOUNTER — Encounter (HOSPITAL_COMMUNITY): Payer: Self-pay

## 2019-07-30 ENCOUNTER — Other Ambulatory Visit: Payer: Self-pay

## 2019-07-30 DIAGNOSIS — Z7984 Long term (current) use of oral hypoglycemic drugs: Secondary | ICD-10-CM | POA: Insufficient documentation

## 2019-07-30 DIAGNOSIS — Z79899 Other long term (current) drug therapy: Secondary | ICD-10-CM | POA: Diagnosis not present

## 2019-07-30 DIAGNOSIS — Z950 Presence of cardiac pacemaker: Secondary | ICD-10-CM | POA: Insufficient documentation

## 2019-07-30 DIAGNOSIS — I10 Essential (primary) hypertension: Secondary | ICD-10-CM | POA: Insufficient documentation

## 2019-07-30 DIAGNOSIS — E785 Hyperlipidemia, unspecified: Secondary | ICD-10-CM | POA: Diagnosis not present

## 2019-07-30 DIAGNOSIS — Z85118 Personal history of other malignant neoplasm of bronchus and lung: Secondary | ICD-10-CM | POA: Diagnosis not present

## 2019-07-30 DIAGNOSIS — C3490 Malignant neoplasm of unspecified part of unspecified bronchus or lung: Secondary | ICD-10-CM

## 2019-07-30 DIAGNOSIS — E119 Type 2 diabetes mellitus without complications: Secondary | ICD-10-CM | POA: Insufficient documentation

## 2019-07-30 DIAGNOSIS — Z7951 Long term (current) use of inhaled steroids: Secondary | ICD-10-CM | POA: Insufficient documentation

## 2019-07-30 DIAGNOSIS — R590 Localized enlarged lymph nodes: Secondary | ICD-10-CM | POA: Diagnosis not present

## 2019-07-30 DIAGNOSIS — I4821 Permanent atrial fibrillation: Secondary | ICD-10-CM | POA: Diagnosis not present

## 2019-07-30 DIAGNOSIS — Z7982 Long term (current) use of aspirin: Secondary | ICD-10-CM | POA: Insufficient documentation

## 2019-07-30 DIAGNOSIS — Z8673 Personal history of transient ischemic attack (TIA), and cerebral infarction without residual deficits: Secondary | ICD-10-CM | POA: Diagnosis not present

## 2019-07-30 DIAGNOSIS — I252 Old myocardial infarction: Secondary | ICD-10-CM | POA: Diagnosis not present

## 2019-07-30 DIAGNOSIS — J449 Chronic obstructive pulmonary disease, unspecified: Secondary | ICD-10-CM | POA: Insufficient documentation

## 2019-07-30 LAB — CBC WITH DIFFERENTIAL (CANCER CENTER ONLY)
Abs Immature Granulocytes: 0.03 10*3/uL (ref 0.00–0.07)
Basophils Absolute: 0.1 10*3/uL (ref 0.0–0.1)
Basophils Relative: 1 %
Eosinophils Absolute: 0.2 10*3/uL (ref 0.0–0.5)
Eosinophils Relative: 2 %
HCT: 38.3 % — ABNORMAL LOW (ref 39.0–52.0)
Hemoglobin: 11 g/dL — ABNORMAL LOW (ref 13.0–17.0)
Immature Granulocytes: 0 %
Lymphocytes Relative: 6 %
Lymphs Abs: 0.5 10*3/uL — ABNORMAL LOW (ref 0.7–4.0)
MCH: 22.8 pg — ABNORMAL LOW (ref 26.0–34.0)
MCHC: 28.7 g/dL — ABNORMAL LOW (ref 30.0–36.0)
MCV: 79.3 fL — ABNORMAL LOW (ref 80.0–100.0)
Monocytes Absolute: 1 10*3/uL (ref 0.1–1.0)
Monocytes Relative: 11 %
Neutro Abs: 7.4 10*3/uL (ref 1.7–7.7)
Neutrophils Relative %: 80 %
Platelet Count: 314 10*3/uL (ref 150–400)
RBC: 4.83 MIL/uL (ref 4.22–5.81)
RDW: 18.7 % — ABNORMAL HIGH (ref 11.5–15.5)
WBC Count: 9.2 10*3/uL (ref 4.0–10.5)
nRBC: 0 % (ref 0.0–0.2)

## 2019-07-30 LAB — CMP (CANCER CENTER ONLY)
ALT: <6 U/L (ref 0–44)
AST: 7 U/L — ABNORMAL LOW (ref 15–41)
Albumin: 4 g/dL (ref 3.5–5.0)
Alkaline Phosphatase: 71 U/L (ref 38–126)
Anion gap: 15 (ref 5–15)
BUN: 24 mg/dL — ABNORMAL HIGH (ref 8–23)
CO2: 24 mmol/L (ref 22–32)
Calcium: 8.9 mg/dL (ref 8.9–10.3)
Chloride: 100 mmol/L (ref 98–111)
Creatinine: 1.15 mg/dL (ref 0.61–1.24)
GFR, Est AFR Am: >60 mL/min (ref >60)
GFR, Estimated: >60 mL/min (ref >60)
Glucose, Bld: 195 mg/dL — ABNORMAL HIGH (ref 70–99)
Potassium: 4.9 mmol/L (ref 3.5–5.1)
Sodium: 139 mmol/L (ref 135–145)
Total Bilirubin: 2 mg/dL — ABNORMAL HIGH (ref 0.3–1.2)
Total Protein: 7.2 g/dL (ref 6.5–8.1)

## 2019-07-30 MED ORDER — IOHEXOL 300 MG/ML  SOLN
75.0000 mL | Freq: Once | INTRAMUSCULAR | Status: AC | PRN
  Administered 2019-07-30: 11:00:00 75 mL via INTRAVENOUS

## 2019-07-30 MED ORDER — SODIUM CHLORIDE (PF) 0.9 % IJ SOLN
INTRAMUSCULAR | Status: AC
  Filled 2019-07-30: qty 50

## 2019-08-01 NOTE — Progress Notes (Signed)
[facility] OFFICE PROGRESS NOTE  [name], NP [place] [address]  DIAGNOSIS: Stage IA (T1a, N0, M0) non-small cell lung cancer, well-differentiated adenocarcinoma of the left upper lobe diagnosed in February of 2015  PRIOR THERAPY: Status post wedge resection of the left upper lobe under the care of Dr. Roxy Manns on 11/29/2013.  CURRENT THERAPY: Observation  INTERVAL HISTORY: [name] 74 y.o. male returns to the clinic for a follow up visit. The patient is feeling well today without any concerning complaints except for baseline shortness of breath for which he is on 4L of home oxygen via nasal cannula. Denies any fever, chills, night sweats, or weight loss. Denies any chest pain, cough, or hemoptysis. Denies any nausea, vomiting, diarrhea, or constipation. Denies any headache or visual changes. The patient recently had a restaging CT scan performed. The patient is here today for evaluation and to review his scan results.   MEDICAL HISTORY: Past Medical History:  Diagnosis Date  . Atrial septal defect    Closed with surgery January, 2010  . Automatic implantable cardioverter-defibrillator in situ    LV dysfunction and pacer needed for AV node lesion  . Chronic combined systolic and diastolic CHF (congestive heart failure) ([place])   . Colon polyps   . COPD (chronic obstructive pulmonary disease) (HCC)    O2- 2 liters, nasal cannula, q night   . CVA (cerebral vascular accident) ([place]) 2009   denies residual on 08/14/2013  . Diabetes mellitus without complication ([place])    type 2  . Dyslipidemia   . Endocarditis    Bacterial, 2009  . Hypertension   . Intracranial hemorrhage (HCC)    Coumadin cannot be used because of the history of his bleed  . Lung cancer ([place]) 11/29/2013   T1N0 Stage Ia non-small cell carcinoma left lung treated with wedge resection  . Myocardial infarction ([place]) 2010  . Pacemaker    combo pacer and icd  . Permanent  atrial fibrillation    Originally Coumadin use for atrial fibrillation  //   he had intracerebral hemorrhage with an INR of 2.3 June, 2009. Anticoagulation could no longer be used.  //  Rapid atrial fibrillation after inferior MI October, 2010..........Marland Kitchen AV node ablation done at that time with ICD pacemaker placed (EF 35%).   //   Left atrial appendage tied off at the time of mitral valve surgery January, 2010 (maze pro  . Pneumonia 07/2018  . Pulmonary hypertension (HCC)    Moderate  . Renal artery stenosis (HCC)    Mild by history  . 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 Spontaneous pneumothorax    right thoracotomy - distant past  . Status post minimally invasive mitral valve replacement with bioprosthetic valve    33 mm Medtronic Mosaic porcine bioprosthesis placed via right mini thoracotomy for bacterial endocarditis complicated by severe MR and CHF   . Thoracic aortic aneurysm ([place]) 08/11/2016   a - Chest CTA 1/18:  Aneurysmal dilatation of aortic root is noted at 5.1 cm.     ALLERGIES:  is allergic to anticoagulant compound; other; and warfarin sodium.  MEDICATIONS:  Current Outpatient Medications  Medication Sig Dispense Refill  . acetaminophen (TYLENOL) 500 MG tablet Take 500-1,000 mg by mouth every 8 (eight) hours as needed for moderate pain or headache.     . albuterol (PROVENTIL HFA) 108 (90 Base) MCG/ACT inhaler Inhale 2 puffs into the lungs every 6 (six) hours as needed for wheezing  or shortness of breath.    Marland Kitchen aspirin EC 81 MG tablet Take 1 tablet (81 mg total) by mouth daily. 90 tablet 3  . atorvastatin (LIPITOR) 10 MG tablet Take 10 mg by mouth daily.    . budesonide-formoterol (SYMBICORT) 160-4.5 MCG/ACT inhaler INHALE 2 PUFFS INTO THE LUNGS TWICE A DAY 10.2 Inhaler 6  . furosemide (LASIX) 40 MG tablet Take 1 tablet (40 mg total) by mouth 2 (two) times daily. 60 tablet 3  . metFORMIN (GLUCOPHAGE) 1000 MG tablet TAKE 1 TABLET BY MOUTH TWICE A DAY WITH MEALS 180 tablet 0   . metoprolol succinate (TOPROL-XL) 25 MG 24 hr tablet Take 0.5 tablets (12.5 mg total) by mouth daily. 30 tablet 0  . OXYGEN Inhale 2-3 L/min into the lungs continuous.     . potassium chloride SA (KLOR-CON) 20 MEQ tablet Take 2 tablets (40 mEq total) by mouth daily. 180 tablet 1  . nitroGLYCERIN (NITROSTAT) 0.4 MG SL tablet Place 0.4 mg under the tongue every 5 (five) minutes x 3 doses as needed for chest pain.      No current facility-administered medications for this visit.    SURGICAL HISTORY:  Past Surgical History:  Procedure Laterality Date  . APPENDECTOMY    . ASD REPAIR, SECUNDUM  07/17/2008   pericardial patch closure of ASD  . AV NODE ABLATION  07/2008   for rapid atrial fib  . CARDIAC CATHETERIZATION    . CARDIAC DEFIBRILLATOR PLACEMENT  ~ [address] [place]  . CARDIAC VALVE REPLACEMENT    . CATARACT EXTRACTION W/ INTRAOCULAR LENS  IMPLANT, BILATERAL Bilateral   . ESOPHAGOGASTRODUODENOSCOPY (EGD) WITH PROPOFOL N/A 09/29/2016   Procedure: ESOPHAGOGASTRODUODENOSCOPY (EGD) WITH PROPOFOL;  Surgeon: [name], MD;  Location: [facility] ENDOSCOPY;  Service: Endoscopy;  Laterality: N/A;  . HERNIA REPAIR    . IMPLANTABLE CARDIOVERTER DEFIBRILLATOR (ICD) GENERATOR CHANGE N/A 02/06/2012   Procedure: ICD GENERATOR CHANGE;  Surgeon: [name], MD;  Location: [facility] CATH LAB;  Service: Cardiovascular;  Laterality: N/A;  . INSERT / REPLACE / REMOVE PACEMAKER    . MASS BIOPSY Left    neck mass  . MITRAL VALVE REPLACEMENT Right 07/17/2008   75mm Medtronic Mosaic porcine bioprosthesis  . PENILE PROSTHESIS IMPLANT    . RIGHT HEART CATHETERIZATION N/A 08/16/2013   Procedure: RIGHT HEART CATH;  Surgeon: Josue Hector, MD;  Location: [facility] CATH LAB;  Service: Cardiovascular;  Laterality: N/A;  . TEE WITHOUT CARDIOVERSION N/A 09/06/2016   Procedure: TRANSESOPHAGEAL ECHOCARDIOGRAM (TEE);  Surgeon: Dorothy Spark, MD;  Location: [facility] ENDOSCOPY;  Service: Cardiovascular;  Laterality: N/A;  . THORACOTOMY  Right 1970's   spontaneous pneumothorax - while in the [place]  . TOE SURGERY     left foot hammer toe  . TONSILLECTOMY    . VIDEO ASSISTED THORACOSCOPY (VATS)/WEDGE RESECTION Left 11/29/2013   Procedure: Video assisted thoracoscopy for wedge resection; mini thoracotomy;  Surgeon: [name], MD;  Location: [facility];  Service: Thoracic;  Laterality: Left;    REVIEW OF SYSTEMS:   Review of Systems  Constitutional: Negative for appetite change, chills, fatigue, fever and unexpected weight change.  HENT: Negative for mouth sores, nosebleeds, sore throat and trouble swallowing.   Eyes: Negative for eye problems and icterus.  Respiratory: Positive for baseline shortness of breath. Negative for cough, hemoptysis, and wheezing.  Cardiovascular: Negative for chest pain and leg swelling.  Gastrointestinal: Negative for abdominal pain, constipation, diarrhea, nausea and vomiting.  Genitourinary: Negative for bladder  incontinence, difficulty urinating, dysuria, frequency and hematuria.   Musculoskeletal: Negative for back pain, gait problem, neck pain and neck stiffness.  Skin: Negative for itching and rash.  Neurological: Negative for dizziness, extremity weakness, gait problem, headaches, light-headedness and seizures.  Hematological: Negative for adenopathy. Does not bruise/bleed easily.  Psychiatric/Behavioral: Negative for confusion, depression and sleep disturbance. The patient is not nervous/anxious.     PHYSICAL EXAMINATION:  Blood pressure (!) 100/46, pulse 75, temperature 98.5 F (36.9 C), temperature source Temporal, resp. rate 18, height 6\' 1"  (1.854 m), weight 155 lb 1.6 oz (70.4 kg), SpO2 100 %, peak flow (!) 4 L/min.  ECOG PERFORMANCE STATUS: 1 - Symptomatic but completely ambulatory  Physical Exam  Constitutional: Oriented to person, place, and time and thin-appearing male and in no distress.  HENT:  Head: Normocephalic and atraumatic.  Mouth/Throat: Oropharynx is clear and  moist. No oropharyngeal exudate.  Eyes: Conjunctivae are normal. Right eye exhibits no discharge. Left eye exhibits no discharge. No scleral icterus.  Neck: Normal range of motion. Neck supple.  Cardiovascular: Normal rate, regular rhythm, normal heart sounds and intact distal pulses.   Pulmonary/Chest: Effort normal and breath sounds normal. No respiratory distress. No wheezes. No rales.  Abdominal: Soft. Bowel sounds are normal. Exhibits no distension and no mass. There is no tenderness.  Musculoskeletal: Normal range of motion. Exhibits no edema.  Lymphadenopathy:    No cervical adenopathy.  Neurological: Alert and oriented to person, place, and time. Exhibits normal muscle tone. Gait normal. Coordination normal.  Skin: Skin is warm and dry. No rash noted. Not diaphoretic. No erythema. No pallor.  Psychiatric: Mood, memory and judgment normal.  Vitals reviewed.  LABORATORY DATA: Lab Results  Component Value Date   WBC 9.2 07/30/2019   HGB 11.0 (L) 07/30/2019   HCT 38.3 (L) 07/30/2019   MCV 79.3 (L) 07/30/2019   PLT 314 07/30/2019      Chemistry      Component Value Date/Time   NA 139 07/30/2019 1003   NA 140 12/20/2016 0746   K 4.9 07/30/2019 1003   K 5.1 12/20/2016 0746   CL 100 07/30/2019 1003   CO2 24 07/30/2019 1003   CO2 29 12/20/2016 0746   BUN 24 (H) 07/30/2019 1003   BUN 20.6 12/20/2016 0746   CREATININE 1.15 07/30/2019 1003   CREATININE 1.2 12/20/2016 0746      Component Value Date/Time   CALCIUM 8.9 07/30/2019 1003   CALCIUM 10.0 12/20/2016 0746   ALKPHOS 71 07/30/2019 1003   ALKPHOS 64 12/20/2016 0746   AST 7 (L) 07/30/2019 1003   AST 11 12/20/2016 0746   ALT <6 07/30/2019 1003   ALT 8 12/20/2016 0746   BILITOT 2.0 (H) 07/30/2019 1003   BILITOT 3.01 (H) 12/20/2016 0746       RADIOGRAPHIC STUDIES:  CT Chest W Contrast  Result Date: 07/30/2019 CLINICAL DATA:  Restaging lung cancer. EXAM: CT CHEST WITH CONTRAST TECHNIQUE: Multidetector CT imaging  of the chest was performed during intravenous contrast administration. CONTRAST:  60mL OMNIPAQUE IOHEXOL 300 MG/ML  SOLN COMPARISON:  06/10/2019 FINDINGS: Cardiovascular: The heart size is markedly enlarged. No pericardial effusion identified. Aortic atherosclerosis. Three vessel coronary artery calcifications. Mediastinum/Nodes: Index AP window lymph node measures 1.5 cm, image 66/2. Previously 1.6 cm. Index azygoesophageal recess lymph node measures 1.7 cm, image 92/2. Previously 2.1 cm. Left supraclavicular lymph node measures 1.2 cm, image 16/2. Previously 1.6 cm. Normal appearance of the thyroid gland. The trachea appears patent and is  midline. Normal appearance of the esophagus. Lungs/Pleura: Advanced changes of centrilobular and paraseptal emphysema. Pleuroparenchymal scarring and thickening within the posterior right lung base is unchanged. Bilateral peripheral lower lung zone predominant ground-glass attenuation and interstitial reticulation is noted. Postop change from left upper lobe wedge resection noted with associated volume loss. No specific finding to suggest residual or recurrence of tumor along the suture line. Calcified granulomas identified within the anterolateral right upper lobe. Upper Abdomen: No acute abnormality. Aortic atherosclerosis. Multiple left kidney cysts. Musculoskeletal: Spondylosis identified. The bones appear osteopenic. No suspicious bone lesions IMPRESSION: 1. Interval decrease in size of mediastinal and left supraclavicular lymph nodes. 2. No suspicious pulmonary nodule or mass identified. 3. Emphysema and aortic atherosclerosis. 4. Marked cardiac enlargement. Three vessel coronary artery calcifications. Aortic Atherosclerosis (ICD10-I70.0) and Emphysema (ICD10-J43.9). Electronically Signed   By: Kerby Moors M.D.   On: 07/30/2019 12:50     ASSESSMENT/PLAN:  This is a very pleasant 74 year old Caucasian male with stage IA non-small cell lung cancer, well differentiated  adenocarcinoma. He presented with a left upper lobe nodule in 2015. He is status post wedge resection of this lesion and is currently on observation and feeling fine.   The patient recently had a restaging CT scan. Dr. Julien Nordmann personally and independently reviewed the scan and discussed the results with the patient today. The scan did not show any evidence of disease recurrence. The patient had stable enlarged mediastinal lymphadenopathy. The adenopathy has decreased in size in the interval since his last scan.   Dr. Julien Nordmann recommends that the patient continue on observation.   We will see him back in 6 months for evaluation and a repeat CT scan of the chest.   The patient was advised to call immediately if he has any concerning symptoms in the interval. The patient voices understanding of current disease status and treatment options and is in agreement with the current care plan. All questions were answered. The patient knows to call the clinic with any problems, questions or concerns. We can certainly see the patient much sooner if necessary.  Orders Placed This Encounter  Procedures  . CT Chest W Contrast    Standing Status:   Future    Standing Expiration Date:   07/31/2020    Order Specific Question:   ** REASON FOR EXAM (FREE TEXT)    Answer:   Restaging Lung Cancer. Hx stage IA 2015    Order Specific Question:   If indicated for the ordered procedure, I authorize the administration of contrast media per Radiology protocol    Answer:   Yes    Order Specific Question:   Preferred imaging location?    Answer:   [facility]    Order Specific Question:   Radiology Contrast Protocol - do NOT remove file path    Answer:   [email or web address]  . CBC with Differential ([place] Only)    Standing Status:   Future    Standing Expiration Date:   08/01/2020  . CMP ([place] only)    Standing Status:   Future    Standing Expiration Date:   08/01/2020      Charles Sos Leinani Lisbon, PA-C 08/02/19  ADDENDUM: Hematology/Oncology Attending: I had a face-to-face encounter with the patient today.  I recommended his care plan.  This is a very pleasant 74 years old white male with stage Ia non-small cell lung cancer, adenocarcinoma status post wedge resection of the lesion and currently on observation since 2015.  The patient was found on previous imaging studies to have mediastinal lymphadenopathy and we have been monitoring him closely.  He also has severe COPD and currently on home oxygen 4 L/minute nasal cannula. The patient had repeat CT scan of the chest performed recently.  I personally and independently reviewed the scan and discussed the results with the patient today. His scan showed no concerning findings for disease recurrence or metastasis. I recommended for him to continue on observation with repeat CT scan of the chest in 6 months. He was advised to call immediately if he has any concerning symptoms in the interval.  Disclaimer: This note was dictated with voice recognition software. Similar sounding words can inadvertently be transcribed and may be missed upon review. Eilleen Kempf, MD 08/02/19

## 2019-08-02 ENCOUNTER — Inpatient Hospital Stay (HOSPITAL_BASED_OUTPATIENT_CLINIC_OR_DEPARTMENT_OTHER): Payer: Medicare Other | Admitting: Physician Assistant

## 2019-08-02 ENCOUNTER — Encounter: Payer: Self-pay | Admitting: Physician Assistant

## 2019-08-02 ENCOUNTER — Other Ambulatory Visit: Payer: Self-pay

## 2019-08-02 ENCOUNTER — Other Ambulatory Visit: Payer: Medicare Other

## 2019-08-02 VITALS — BP 100/46 | HR 75 | Temp 98.5°F | Resp 18 | Ht 73.0 in | Wt 155.1 lb

## 2019-08-02 DIAGNOSIS — R590 Localized enlarged lymph nodes: Secondary | ICD-10-CM | POA: Diagnosis not present

## 2019-08-02 DIAGNOSIS — C3412 Malignant neoplasm of upper lobe, left bronchus or lung: Secondary | ICD-10-CM | POA: Diagnosis not present

## 2019-08-02 DIAGNOSIS — E785 Hyperlipidemia, unspecified: Secondary | ICD-10-CM | POA: Diagnosis not present

## 2019-08-02 DIAGNOSIS — C3490 Malignant neoplasm of unspecified part of unspecified bronchus or lung: Secondary | ICD-10-CM | POA: Diagnosis not present

## 2019-08-02 DIAGNOSIS — E119 Type 2 diabetes mellitus without complications: Secondary | ICD-10-CM | POA: Diagnosis not present

## 2019-08-02 DIAGNOSIS — I10 Essential (primary) hypertension: Secondary | ICD-10-CM | POA: Diagnosis not present

## 2019-08-02 DIAGNOSIS — Z85118 Personal history of other malignant neoplasm of bronchus and lung: Secondary | ICD-10-CM | POA: Diagnosis not present

## 2019-08-02 DIAGNOSIS — I252 Old myocardial infarction: Secondary | ICD-10-CM | POA: Diagnosis not present

## 2019-08-12 ENCOUNTER — Ambulatory Visit (INDEPENDENT_AMBULATORY_CARE_PROVIDER_SITE_OTHER): Payer: Medicare Other

## 2019-08-12 ENCOUNTER — Other Ambulatory Visit: Payer: Self-pay | Admitting: Adult Health

## 2019-08-12 VITALS — Ht 73.0 in | Wt 155.0 lb

## 2019-08-12 DIAGNOSIS — Z0000 Encounter for general adult medical examination without abnormal findings: Secondary | ICD-10-CM | POA: Diagnosis not present

## 2019-08-12 MED ORDER — POTASSIUM CHLORIDE CRYS ER 20 MEQ PO TBCR: 40.0000 meq | EXTENDED_RELEASE_TABLET | Freq: Every day | ORAL | 1 refills | Status: AC

## 2019-08-12 NOTE — Progress Notes (Signed)
This visit is being conducted via phone call due to the COVID-19 pandemic. This patient has given me verbal consent via phone to conduct this visit, patient states they are participating from their home address. Some vital [name] may be absent or patient reported.   Patient identification: identified by name, DOB, and current address.  Location provider: [facility], Office Persons participating in the virtual visit: [name] and Charles Forts, LPN.    Subjective:   [name] is a 74 y.o. male who presents for Medicare Annual/Subsequent preventive examination.  [name] reports falling inside home on 08/07/2019 and outside in yard 08/09/2019. He was not injured. He is not sure what caused him to fall. Encouraged him to use a cane with ambulation and to take portable oxygen with him outside when he walks to the mailbox. He is still driving and drives to his daughter's house 3 x week. He does not have any complaints today.  Review of Systems:  No ROS; Annual Medicare Wellness subsequent visit  Cardiac Risk Factors include: advanced age (>20men, >14 women);male gender;hypertension;diabetes mellitus;dyslipidemia;sedentary lifestyle     Objective:    Vitals: Ht 6\' 1"  (1.854 m)   Wt 155 lb (70.3 kg)   BMI 20.45 kg/m   Body mass index is 20.45 kg/m.  Advanced Directives 08/12/2019 06/08/2019 06/07/2019 04/27/2019 04/27/2019 04/25/2019 08/27/2018  Does Patient Have a Medical Advance Directive? Yes Yes Yes Yes Yes Yes Yes  Type of Paramedic of [place];Living will Living will - [place];Living will [place];Living will [place];Living will [place];Living will  Does patient want to make changes to medical advance directive? No - Patient declined No - Patient declined No - Patient declined No - Patient declined - - -  Copy of [place] in Chart? No - copy  requested - - No - copy requested - - No - copy requested  Would patient like information on creating a medical advance directive? - - - - - - -    Tobacco Social History   Tobacco Use  Smoking Status Former Smoker  . Packs/day: 1.00  . Years: 45.00  . Pack years: 45.00  . Types: Cigarettes  . Quit date: 2011  . Years since quitting: 10.0  Smokeless Tobacco Never Used     Counseling given: Not Answered   Clinical Intake:     Pain : No/denies pain     BMI - recorded: 20.45 Nutritional Status: BMI of 19-24  Normal Nutritional Risks: Unintentional weight loss Diabetes: Yes CBG done?: No(does not check blood sugar at home) Did pt. bring in CBG monitor from home?: No  How often do you need to have someone help you when you read instructions, pamphlets, or other written materials from your doctor or pharmacy?: 1 - Never  Interpreter Needed?: No  Information entered by :: Charles Forts, LPN.  Past Medical History:  Diagnosis Date  . Atrial septal defect    Closed with surgery January, 2010  . Automatic implantable cardioverter-defibrillator in situ    LV dysfunction and pacer needed for AV node lesion  . Chronic combined systolic and diastolic CHF (congestive heart failure) ([place])   . Colon polyps   . COPD (chronic obstructive pulmonary disease) (HCC)    O2- 2 liters, nasal cannula, q night   . CVA (cerebral vascular accident) ([place]) 2009   denies residual on 08/14/2013  . Diabetes mellitus without complication ([place])  type 2  . Dyslipidemia   . Endocarditis    Bacterial, 2009  . Hypertension   . Intracranial hemorrhage (HCC)    Coumadin cannot be used because of the history of his bleed  . Lung cancer ([place]) 11/29/2013   T1N0 Stage Ia non-small cell carcinoma left lung treated with wedge resection  . Myocardial infarction ([place]) 2010  . Pacemaker    combo pacer and icd  . Permanent atrial fibrillation    Originally Coumadin use for atrial fibrillation  //    he had intracerebral hemorrhage with an INR of 2.3 June, 2009. Anticoagulation could no longer be used.  //  Rapid atrial fibrillation after inferior MI October, 2010..........Marland Kitchen AV node ablation done at that time with ICD pacemaker placed (EF 35%).   //   Left atrial appendage tied off at the time of mitral valve surgery January, 2010 (maze pro  . Pneumonia 07/2018  . Pulmonary hypertension (HCC)    Moderate  . Renal artery stenosis (HCC)    Mild by history  . 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 Spontaneous pneumothorax    right thoracotomy - distant past  . Status post minimally invasive mitral valve replacement with bioprosthetic valve    33 mm Medtronic Mosaic porcine bioprosthesis placed via right mini thoracotomy for bacterial endocarditis complicated by severe MR and CHF   . Thoracic aortic aneurysm ([place]) 08/11/2016   a - Chest CTA 1/18:  Aneurysmal dilatation of aortic root is noted at 5.1 cm.    Past Surgical History:  Procedure Laterality Date  . APPENDECTOMY    . ASD REPAIR, SECUNDUM  07/17/2008   pericardial patch closure of ASD  . AV NODE ABLATION  07/2008   for rapid atrial fib  . CARDIAC CATHETERIZATION    . CARDIAC DEFIBRILLATOR PLACEMENT  ~ [address]  . CARDIAC VALVE REPLACEMENT    . CATARACT EXTRACTION W/ INTRAOCULAR LENS  IMPLANT, BILATERAL Bilateral   . ESOPHAGOGASTRODUODENOSCOPY (EGD) WITH PROPOFOL N/A 09/29/2016   Procedure: ESOPHAGOGASTRODUODENOSCOPY (EGD) WITH PROPOFOL;  Surgeon: [name], MD;  Location: [facility] ENDOSCOPY;  Service: Endoscopy;  Laterality: N/A;  . HERNIA REPAIR    . IMPLANTABLE CARDIOVERTER DEFIBRILLATOR (ICD) GENERATOR CHANGE N/A 02/06/2012   Procedure: ICD GENERATOR CHANGE;  Surgeon: [name], MD;  Location: [facility] CATH LAB;  Service: Cardiovascular;  Laterality: N/A;  . INSERT / REPLACE / REMOVE PACEMAKER    . MASS BIOPSY Left    neck mass  . MITRAL VALVE REPLACEMENT Right 07/17/2008   39mm Medtronic Mosaic porcine bioprosthesis  .  PENILE PROSTHESIS IMPLANT    . RIGHT HEART CATHETERIZATION N/A 08/16/2013   Procedure: RIGHT HEART CATH;  Surgeon: Josue Hector, MD;  Location: [facility] CATH LAB;  Service: Cardiovascular;  Laterality: N/A;  . TEE WITHOUT CARDIOVERSION N/A 09/06/2016   Procedure: TRANSESOPHAGEAL ECHOCARDIOGRAM (TEE);  Surgeon: Dorothy Spark, MD;  Location: [facility] ENDOSCOPY;  Service: Cardiovascular;  Laterality: N/A;  . THORACOTOMY Right 1970's   spontaneous pneumothorax - while in the [place]  . TOE SURGERY     left foot hammer toe  . TONSILLECTOMY    . VIDEO ASSISTED THORACOSCOPY (VATS)/WEDGE RESECTION Left 11/29/2013   Procedure: Video assisted thoracoscopy for wedge resection; mini thoracotomy;  Surgeon: [name], MD;  Location: [facility] OR;  Service: Thoracic;  Laterality: Left;   Family History  Problem Relation Age of Onset  . Stomach cancer Father   . Stroke Mother    Social  History   Socioeconomic History  . Marital status: Married    Spouse name: [name]  . Number of children: 2  . Years of education: College  . Highest education level: Not on file  Occupational History  . 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 Smoking status: Former Smoker    Packs/day: 1.00    Years: 45.00    Pack years: 45.00    Types: Cigarettes    Quit date: 2011    Years since quitting: 10.0  . Smokeless tobacco: Never Used  Substance and Sexual Activity  . Alcohol use: No    Alcohol/week: 0.0 standard drinks    Comment: 08/14/2013 "used to drink beer; quit:in 1982"  . Drug use: No  . Sexual activity: Yes  Other Topics Concern  . Not on file  Social History Narrative   Patient lives at home with his spouse.   Worked for the post office   Has two boys and a girl. All live local.    1 Mining engineer   Social Determinants of Health   Financial Resource Strain: Low Risk   . Difficulty of Paying Living Expenses: Not hard at all  Food Insecurity: No Food  Insecurity  . Worried About Charity fundraiser in the Last Year: Never true  . Ran Out of Food in the Last Year: Never true  Transportation Needs: No Transportation Needs  . Lack of Transportation (Medical): No  . Lack of Transportation (Non-Medical): No  Physical Activity: Inactive  . Days of Exercise per Week: 0 days  . Minutes of Exercise per Session: 0 min  Stress: No Stress Concern Present  . Feeling of Stress : Only a little  Social Connections: Unknown  . Frequency of Communication with Friends and Family: More than three times a week  . Frequency of Social Gatherings with Friends and Family: Three times a week  . Attends Religious Services: Not on file  . Active Member of Clubs or Organizations: Not on file  . Attends Archivist Meetings: Not on file  . Marital Status: Married    Outpatient Encounter Medications as of 08/12/2019  Medication Sig  . acetaminophen (TYLENOL) 500 MG tablet Take 500-1,000 mg by mouth every 8 (eight) hours as needed for moderate pain or headache.   . albuterol (PROVENTIL HFA) 108 (90 Base) MCG/ACT inhaler Inhale 2 puffs into the lungs every 6 (six) hours as needed for wheezing or shortness of breath.  Marland Kitchen aspirin EC 81 MG tablet Take 1 tablet (81 mg total) by mouth daily.  Marland Kitchen atorvastatin (LIPITOR) 10 MG tablet Take 10 mg by mouth daily.  . budesonide-formoterol (SYMBICORT) 160-4.5 MCG/ACT inhaler INHALE 2 PUFFS INTO THE LUNGS TWICE A DAY  . furosemide (LASIX) 40 MG tablet Take 1 tablet (40 mg total) by mouth 2 (two) times daily.  . metFORMIN (GLUCOPHAGE) 1000 MG tablet TAKE 1 TABLET BY MOUTH TWICE A DAY WITH MEALS  . metoprolol succinate (TOPROL-XL) 25 MG 24 hr tablet Take 0.5 tablets (12.5 mg total) by mouth daily.  . nitroGLYCERIN (NITROSTAT) 0.4 MG SL tablet Place 0.4 mg under the tongue every 5 (five) minutes x 3 doses as needed for chest pain.   . OXYGEN Inhale 2-3 L/min into the lungs continuous.   . [DISCONTINUED] potassium chloride SA  (KLOR-CON) 20 MEQ tablet Take 2 tablets (40 mEq total) by mouth daily.   No facility-administered encounter medications  on file as of 08/12/2019.    Activities of Daily Living In your present state of health, do you have any difficulty performing the following activities: 08/12/2019 06/08/2019  Hearing? Tempie Donning  Vision? N N  Difficulty concentrating or making decisions? N N  Walking or climbing stairs? (No Data) Y  Comment doesn't go up stairs per patient -  Dressing or bathing? N N  Doing errands, shopping? N Y  Conservation officer, nature and eating ? N -  Using the Toilet? N -  In the past six months, have you accidently leaked urine? N -  Do you have problems with loss of bowel control? N -  Managing your Medications? N -  Managing your Finances? N -  Housekeeping or managing your Housekeeping? Y -  Some recent data might be hidden    Patient Care Team: [name], NP as PCP - General (Family Medicine) [name], MD as PCP - Cardiology (Cardiology) [name], MD as PCP - Electrophysiology (Cardiology) Rigoberto Noel, MD as Consulting Physician (Pulmonary Disease) Carlena Bjornstad, MD as Consulting Physician (Cardiology)   Assessment:   This is a routine wellness examination for [name].  Exercise Activities and Dietary recommendations Current Exercise Habits: The patient does not participate in regular exercise at present, Exercise limited by: cardiac condition(s);Other - see comments;respiratory conditions(s)(cancer)  Goals    . Prevent falls     Please use cane with ambulation and take portable oxygen with you to mailbox       Fall Risk Fall Risk  08/12/2019 04/25/2019 04/12/2019 10/25/2016 10/19/2015  Falls in the past year? 1 0 0 No No  Number falls in past yr: 1 - - - -  Injury with Fall? 0 - - - -  Risk for fall due to : History of fall(s);Medication side effect;Other (Comment) - - - -  Risk for fall due to: Comment cardiac condition and cancer - - - -  Follow up Falls  prevention discussed;Education provided;Falls evaluation completed - - - -   Is the patient's home free of loose throw rugs in walkways, pet beds, electrical cords, etc?   no; has oxygen tubing.      Grab bars in the bathroom? yes      Handrails on the stairs?   yes      Adequate lighting?   yes  Timed Get Up and Go Performed: N/A due to telephone visit  Depression Screen PHQ 2/9 Scores 08/12/2019 04/25/2019 04/12/2019 10/25/2016  PHQ - 2 Score 0 0 0 0    Cognitive Function     6CIT Screen 08/12/2019  What Year? 0 points  What month? 0 points  What time? 0 points  Count back from 20 0 points  Months in reverse 0 points  Repeat phrase 0 points  Total Score 0    Immunization History  Administered Date(s) Administered  . Fluad Quad(high Dose 65+) 04/12/2019  . Hep A / Hep B 09/25/2014, 10/02/2014, 10/24/2014, 10/02/2015  . Influenza Split 03/31/2011, 04/02/2012  . Influenza Whole 07/11/2000, 04/01/2009, 04/12/2010  . Influenza, High Dose Seasonal PF 05/01/2015, 04/26/2017, 04/06/2018  . Influenza,inj,Quad PF,6+ Mos 04/04/2013, 04/18/2014  . Influenza-Unspecified 03/23/2016  . Pneumococcal Conjugate-13 08/05/2013  . Pneumococcal Polysaccharide-23 07/11/2000, 04/01/2009, 04/04/2013  . Td 07/11/2000  . Tdap 08/03/2011    Qualifies for Shingles Vaccine? yes  Screening Tests Health Maintenance  Topic Date Due  . OPHTHALMOLOGY EXAM  11/03/2015  . COLONOSCOPY  12/15/2015  . URINE MICROALBUMIN  01/22/2016  . HEMOGLOBIN A1C  10/11/2019  . FOOT EXAM  04/11/2020  . TETANUS/TDAP  08/02/2021  . INFLUENZA VACCINE  Completed  . Hepatitis C Screening  Completed  . PNA vac Low Risk Adult  Completed   Cancer Screenings: Lung: Low Dose CT Chest recommended if Age 73-80 years, 30 pack-year currently smoking OR have quit w/in 15years. Patient does not qualify. Colorectal: yes  Additional Screenings:  Hepatitis C Screening: completed 08/05/2014.     Plan:   [name] declines to  have further colon screening tests. He states that cardiology told him he may not be "put to sleep" and he would decline any treatment in the event abnormalities were found. Shingrix vaccines can be obtained from his pharmacy for cheaper out of pocket costs. He is up to date with all other immunizations.   I have personally reviewed and noted the following in the patient's chart:   . Medical and social history . Use of alcohol, tobacco or illicit drugs  . Current medications and supplements . Functional ability and status . Nutritional status . Physical activity . Advanced directives . List of other physicians . Hospitalizations, surgeries, and ER visits in previous 12 months . Vitals . Screenings to include cognitive, depression, and falls . Referrals and appointments  In addition, I have reviewed and discussed with patient certain preventive protocols, quality metrics, and best practice recommendations. A written personalized care plan for preventive services as well as general preventive health recommendations were provided to patient.     Charles Forts, LPN  03/16/2228

## 2019-08-12 NOTE — Patient Instructions (Signed)
[name] , Thank you for taking time to participate in your Medicare Wellness Visit. I appreciate your ongoing commitment to your health goals. Please review the following plan we discussed and let me know if I can assist you in the future.   Screening recommendations/referrals: Colorectal Screening: patient declines to have further testing  Vision and Dental Exams: Recommended annual ophthalmology exams for early detection of glaucoma and other disorders of the eye. Patient reports he has not had a recent eye exam. Please schedule asap. Recommended annual dental exams for proper oral hygiene. Patient reports he has an appointment scheduled in next few weeks.  Diabetic Exams: Diabetic Eye Exam: past due Diabetic Foot Exam: 04/12/2019. Up to date.  Vaccinations: Influenza vaccine: completed 04/12/2019; Due again in Fall 2021.  Pneumococcal vaccine: completed 04/04/2013 & 08/05/2013. Up to date. Tdap vaccine: completed 08/03/2011; due again 08/02/2021.  Shingles vaccine: Please contact your pharmacy to determine your out of pocket expense for the Shingrix vaccine. You may receive this vaccine at your local pharmacy.  Advanced directives: Advance directives discussed with you today.Please bring a copy of your POA (Power of [place]) and/or Living Will to your next appointment.  Goals: Recommend to drink at least 6-8 8oz glasses of water per day.  Please use a cane and take your portable oxygen when you walk.  Recommend to remove any items from the home that may cause slips or trips.  Recommend to begin DASH diet as directed below  Next appointment: Medicare Annual Wellness 08/12/2020 10:30am  Preventive Care 65 Years and Older, Male Preventive care refers to lifestyle choices and visits with your health care provider that can promote health and wellness. What does preventive care include?  A yearly physical exam. This is also called an annual well check.  Dental exams once or twice  a year.  Routine eye exams. Ask your health care provider how often you should have your eyes checked.  Personal lifestyle choices, including:  Daily care of your teeth and gums.  Regular physical activity.  Eating a healthy diet.  Avoiding tobacco and drug use.  Limiting alcohol use.  Practicing safe sex.  Taking low doses of aspirin every day if recommended by your health care provider..  Taking vitamin and mineral supplements as recommended by your health care provider. What happens during an annual well check? The services and screenings done by your health care provider during your annual well check will depend on your age, overall health, lifestyle risk factors, and family history of disease. Counseling  Your health care provider may ask you questions about your:  Alcohol use.  Tobacco use.  Drug use.  Emotional well-being.  Home and relationship well-being.  Sexual activity.  Eating habits.  History of falls.  Memory and ability to understand (cognition).  Work and work Statistician. Screening  You may have the following tests or measurements:  Height, weight, and BMI.  Blood pressure.  Lipid and cholesterol levels. These may be checked every 5 years, or more frequently if you are over 62 years old.  Skin check.  Lung cancer screening. You may have this screening every year starting at age 85 if you have a 30-pack-year history of smoking and currently smoke or have quit within the past 15 years.  Fecal occult blood test (FOBT) of the stool. You may have this test every year starting at age 45.  Flexible sigmoidoscopy or colonoscopy. You may have a sigmoidoscopy every 5 years or a colonoscopy every 10 years  starting at age 74.  Prostate cancer screening. Recommendations will vary depending on your family history and other risks.  Hepatitis C blood test.  Hepatitis B blood test.  Sexually transmitted disease (STD) testing.  Diabetes screening.  This is done by checking your blood sugar (glucose) after you have not eaten for a while (fasting). You may have this done every 1-3 years.  Abdominal aortic aneurysm (AAA) screening. You may need this if you are a current or former smoker.  Osteoporosis. You may be screened starting at age 62 if you are at high risk. Talk with your health care provider about your test results, treatment options, and if necessary, the need for more tests. Vaccines  Your health care provider may recommend certain vaccines, such as:  Influenza vaccine. This is recommended every year.  Tetanus, diphtheria, and acellular pertussis (Tdap, Td) vaccine. You may need a Td booster every 10 years.  Zoster vaccine. You may need this after age 71.  Pneumococcal 13-valent conjugate (PCV13) vaccine. One dose is recommended after age 19.  Pneumococcal polysaccharide (PPSV23) vaccine. One dose is recommended after age 24. Talk to your health care provider about which screenings and vaccines you need and how often you need them. This information is not intended to replace advice given to you by your health care provider. Make sure you discuss any questions you have with your health care provider. Document Released: 07/24/2015 Document Revised: 03/16/2016 Document Reviewed: 04/28/2015 Elsevier Interactive Patient Education  2017 [place] Prevention in the Home Falls can cause injuries. They can happen to people of all ages. There are many things you can do to make your home safe and to help prevent falls. What can I do on the outside of my home?  Regularly fix the edges of walkways and driveways and fix any cracks.  Remove anything that might make you trip as you walk through a door, such as a raised step or threshold.  Trim any bushes or trees on the path to your home.  Use bright outdoor lighting.  Clear any walking paths of anything that might make someone trip, such as rocks or tools.  Regularly  check to see if handrails are loose or broken. Make sure that both sides of any steps have handrails.  Any raised decks and porches should have guardrails on the edges.  Have any leaves, snow, or ice cleared regularly.  Use sand or salt on walking paths during winter.  Clean up any spills in your garage right away. This includes oil or grease spills. What can I do in the bathroom?  Use night lights.  Install grab bars by the toilet and in the tub and shower. Do not use towel bars as grab bars.  Use non-skid mats or decals in the tub or shower.  If you need to sit down in the shower, use a plastic, non-slip stool.  Keep the floor dry. Clean up any water that spills on the floor as soon as it happens.  Remove soap buildup in the tub or shower regularly.  Attach bath mats securely with double-sided non-slip rug tape.  Do not have throw rugs and other things on the floor that can make you trip. What can I do in the bedroom?  Use night lights.  Make sure that you have a light by your bed that is easy to reach.  Do not use any sheets or blankets that are too big for your bed. They should not hang down  onto the floor.  Have a firm chair that has side arms. You can use this for support while you get dressed.  Do not have throw rugs and other things on the floor that can make you trip. What can I do in the kitchen?  Clean up any spills right away.  Avoid walking on wet floors.  Keep items that you use a lot in easy-to-reach places.  If you need to reach something above you, use a strong step stool that has a grab bar.  Keep electrical cords out of the way.  Do not use floor polish or wax that makes floors slippery. If you must use wax, use non-skid floor wax.  Do not have throw rugs and other things on the floor that can make you trip. What can I do with my stairs?  Do not leave any items on the stairs.  Make sure that there are handrails on both sides of the stairs and  use them. Fix handrails that are broken or loose. Make sure that handrails are as long as the stairways.  Check any carpeting to make sure that it is firmly attached to the stairs. Fix any carpet that is loose or worn.  Avoid having throw rugs at the top or bottom of the stairs. If you do have throw rugs, attach them to the floor with carpet tape.  Make sure that you have a light switch at the top of the stairs and the bottom of the stairs. If you do not have them, ask someone to add them for you. What else can I do to help prevent falls?  Wear shoes that:  Do not have high heels.  Have rubber bottoms.  Are comfortable and fit you well.  Are closed at the toe. Do not wear sandals.  If you use a stepladder:  Make sure that it is fully opened. Do not climb a closed stepladder.  Make sure that both sides of the stepladder are locked into place.  Ask someone to hold it for you, if possible.  Clearly mark and make sure that you can see:  Any grab bars or handrails.  First and last steps.  Where the edge of each step is.  Use tools that help you move around (mobility aids) if they are needed. These include:  Canes.  Walkers.  Scooters.  Crutches.  Turn on the lights when you go into a dark area. Replace any light bulbs as soon as they burn out.  Set up your furniture so you have a clear path. Avoid moving your furniture around.  If any of your floors are uneven, fix them.  If there are any pets around you, be aware of where they are.  Review your medicines with your doctor. Some medicines can make you feel dizzy. This can increase your chance of falling. Ask your doctor what other things that you can do to help prevent falls. This information is not intended to replace advice given to you by your health care provider. Make sure you discuss any questions you have with your health care provider. Document Released: 04/23/2009 Document Revised: 12/03/2015 Document  Reviewed: 08/01/2014 Elsevier Interactive Patient Education  2017 [employer].

## 2019-08-14 NOTE — Progress Notes (Signed)
CARDIOLOGY OFFICE NOTE  Date:  08/19/2019    [name] Date of Birth: Sep 04, 1945 Medical Record #[id number]  PCP:  [name], NP  Cardiologist:  Servando Snare [facility]   Chief Complaint  Patient presents with  . Follow-up    Seen for Dr. Marlou Porch    History of Present Illness: [name] is a 74 y.o. male who presents today for a 2 month check. Seen for Dr. Marlou Porch & Dr. Lovena Le.   He has a history of MV disease with prior bioprosthetic valve in the mitral position, persistent AF, prior AV nodal ablation (due to inability to rate control), underlying ICD/PPM and prior CNS bleed (not on anticoagulation). Has had prior left atrial appendage occlusion/tied. He has had chronic elevation of his bilirubin. He has developed lung cancer and is followed with Dr. Earlie Server - on oxygen. Other issues include HTN, HLD, COPD, DM and a very large sinus of valsalva aneurysm.   He saw Dr. Roxy Manns back in June of 2020 for his aneurysm and was felt to be stable - no plans for any type intervention due to his numerous co-morbidities and high risk status. Has chronic shortness of breath - on oxygen. He was admitted with heart failure exacerbation back in the fall. Was seen by palliative care - he is a DNR but wanted to continue with all his current therapies.   I last saw him in November - he was doing well - actually getting out and raking some leaves. BP chronically soft. He then saw Dr. Marlou Porch in December following another admission for acute heart failure.     The patient does not have symptoms concerning for COVID-19 infection (fever, chills, cough, or new shortness of breath).   Comes in today. Here alone. He says he is doing ok. He denies chest pain. Says his breathing is fine. Weight is down some. He is trying to watch his diet better. Trying to get an appointment for a vaccine. No problems with his medicines. Labs last month noted.   Past Medical History:  Diagnosis Date  . Atrial  septal defect    Closed with surgery January, 2010  . Automatic implantable cardioverter-defibrillator in situ    LV dysfunction and pacer needed for AV node lesion  . Chronic combined systolic and diastolic CHF (congestive heart failure) ([place])   . Colon polyps   . COPD (chronic obstructive pulmonary disease) (HCC)    O2- 2 liters, nasal cannula, q night   . CVA (cerebral vascular accident) ([place]) 2009   denies residual on 08/14/2013  . Diabetes mellitus without complication ([place])    type 2  . Dyslipidemia   . Endocarditis    Bacterial, 2009  . Hypertension   . Intracranial hemorrhage (HCC)    Coumadin cannot be used because of the history of his bleed  . Lung cancer ([place]) 11/29/2013   T1N0 Stage Ia non-small cell carcinoma left lung treated with wedge resection  . Myocardial infarction ([place]) 2010  . Pacemaker    combo pacer and icd  . Permanent atrial fibrillation    Originally Coumadin use for atrial fibrillation  //   he had intracerebral hemorrhage with an INR of 2.3 June, 2009. Anticoagulation could no longer be used.  //  Rapid atrial fibrillation after inferior MI October, 2010..........Marland Kitchen AV node ablation done at that time with ICD pacemaker placed (EF 35%).   //   Left atrial appendage tied off at the time of mitral valve  surgery January, 2010 (maze pro  . Pneumonia 07/2018  . Pulmonary hypertension (HCC)    Moderate  . Renal artery stenosis (HCC)    Mild by history  . 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 Spontaneous pneumothorax    right thoracotomy - distant past  . Status post minimally invasive mitral valve replacement with bioprosthetic valve    33 mm Medtronic Mosaic porcine bioprosthesis placed via right mini thoracotomy for bacterial endocarditis complicated by severe MR and CHF   . Thoracic aortic aneurysm ([place]) 08/11/2016   a - Chest CTA 1/18:  Aneurysmal dilatation of aortic root is noted at 5.1 cm.     Past Surgical History:  Procedure Laterality Date  .  APPENDECTOMY    . ASD REPAIR, SECUNDUM  07/17/2008   pericardial patch closure of ASD  . AV NODE ABLATION  07/2008   for rapid atrial fib  . CARDIAC CATHETERIZATION    . CARDIAC DEFIBRILLATOR PLACEMENT  ~ [address] [place]  . CARDIAC VALVE REPLACEMENT    . CATARACT EXTRACTION W/ INTRAOCULAR LENS  IMPLANT, BILATERAL Bilateral   . ESOPHAGOGASTRODUODENOSCOPY (EGD) WITH PROPOFOL N/A 09/29/2016   Procedure: ESOPHAGOGASTRODUODENOSCOPY (EGD) WITH PROPOFOL;  Surgeon: [name], MD;  Location: [facility] ENDOSCOPY;  Service: Endoscopy;  Laterality: N/A;  . HERNIA REPAIR    . IMPLANTABLE CARDIOVERTER DEFIBRILLATOR (ICD) GENERATOR CHANGE N/A 02/06/2012   Procedure: ICD GENERATOR CHANGE;  Surgeon: [name], MD;  Location: [facility] CATH LAB;  Service: Cardiovascular;  Laterality: N/A;  . INSERT / REPLACE / REMOVE PACEMAKER    . MASS BIOPSY Left    neck mass  . MITRAL VALVE REPLACEMENT Right 07/17/2008   33mm Medtronic Mosaic porcine bioprosthesis  . PENILE PROSTHESIS IMPLANT    . RIGHT HEART CATHETERIZATION N/A 08/16/2013   Procedure: RIGHT HEART CATH;  Surgeon: Josue Hector, MD;  Location: [facility] LAB;  Service: Cardiovascular;  Laterality: N/A;  . TEE WITHOUT CARDIOVERSION N/A 09/06/2016   Procedure: TRANSESOPHAGEAL ECHOCARDIOGRAM (TEE);  Surgeon: Dorothy Spark, MD;  Location: [facility] ENDOSCOPY;  Service: Cardiovascular;  Laterality: N/A;  . THORACOTOMY Right 1970's   spontaneous pneumothorax - while in the [facility]  . TOE SURGERY     left foot hammer toe  . TONSILLECTOMY    . VIDEO ASSISTED THORACOSCOPY (VATS)/WEDGE RESECTION Left 11/29/2013   Procedure: Video assisted thoracoscopy for wedge resection; mini thoracotomy;  Surgeon: [name], MD;  Location: [facility] OR;  Service: Thoracic;  Laterality: Left;     Medications: Current Meds  Medication Sig  . acetaminophen (TYLENOL) 500 MG tablet Take 500-1,000 mg by mouth every 8 (eight) hours as needed for moderate pain or headache.   . albuterol  (PROVENTIL HFA) 108 (90 Base) MCG/ACT inhaler Inhale 2 puffs into the lungs every 6 (six) hours as needed for wheezing or shortness of breath.  Marland Kitchen aspirin EC 81 MG tablet Take 1 tablet (81 mg total) by mouth daily.  Marland Kitchen atorvastatin (LIPITOR) 10 MG tablet Take 10 mg by mouth daily.  . budesonide-formoterol (SYMBICORT) 160-4.5 MCG/ACT inhaler INHALE 2 PUFFS INTO THE LUNGS TWICE A DAY  . furosemide (LASIX) 40 MG tablet Take 1 tablet (40 mg total) by mouth 2 (two) times daily.  . metFORMIN (GLUCOPHAGE) 1000 MG tablet TAKE 1 TABLET BY MOUTH TWICE A DAY WITH MEALS  . metoprolol succinate (TOPROL-XL) 25 MG 24 hr tablet Take 0.5 tablets (12.5 mg total) by mouth daily.  . nitroGLYCERIN (NITROSTAT) 0.4 MG SL tablet Place  0.4 mg under the tongue every 5 (five) minutes x 3 doses as needed for chest pain.   . potassium chloride SA (KLOR-CON) 20 MEQ tablet Take 2 tablets (40 mEq total) by mouth daily.     Allergies: Allergies  Allergen Reactions  . Anticoagulant Compound Other (See Comments)    Pt had intracranial bleed, therefore all anticoagulation is contraindicated per Dr. Ron Parker  . Other Other (See Comments)    Per Dr. Halford Chessman (Surgeon): stated that the patient cannot be put under for any surgery, as he has an enlarged aorta. He would stand only a 50/50 chance of surviving. He has lung issues, diminished lung tissue, COPD, and emphysema.  . Warfarin Sodium Other (See Comments)    Pt had intracranial bleed, therefore all anticoagulation is contraindicated per Dr. Ron Parker    Social History: The patient  reports that he quit smoking about 10 years ago. His smoking use included cigarettes. He has a 45.00 pack-year smoking history. He has never used smokeless tobacco. He reports that he does not drink alcohol or use drugs.   Family History: The patient's family history includes Stomach cancer in his father; Stroke in his mother.   Review of Systems: Please see the history of present illness.   All  other systems are reviewed and negative.   Physical Exam: VS:  BP 108/60   Pulse 72   Ht 6\' 1"  (1.854 m)   Wt 151 lb (68.5 kg)   BMI 19.92 kg/m  .  BMI Body mass index is 19.92 kg/m.  Wt Readings from Last 3 Encounters:  08/19/19 151 lb (68.5 kg)  08/12/19 155 lb (70.3 kg)  08/02/19 155 lb 1.6 oz (70.4 kg)    General: Pleasant. Alert and in no acute distress. He looks chronically ill. Color is sallow.  Cardiac: Very much regular rate and rhythm (presumed paced). No real murmur. No edema. Respiratory:  Lungs are fairly clear to auscultation bilaterally with normal work of breathing.  MS: No deformity or atrophy. Gait and ROM intact.  Skin: Warm and dry. Color is sallow.  Neuro:  Strength and sensation are intact and no gross focal deficits noted.  Psych: Alert, appropriate and with normal affect.   LABORATORY DATA:  EKG:  EKG is not ordered today.   Lab Results  Component Value Date   WBC 9.2 07/30/2019   HGB 11.0 (L) 07/30/2019   HCT 38.3 (L) 07/30/2019   PLT 314 07/30/2019   GLUCOSE 195 (H) 07/30/2019   CHOL 85 04/12/2019   TRIG 95.0 04/12/2019   HDL 45.30 04/12/2019   LDLDIRECT 80.2 03/31/2011   LDLCALC 20 04/12/2019   ALT <6 07/30/2019   AST 7 (L) 07/30/2019   NA 139 07/30/2019   K 4.9 07/30/2019   CL 100 07/30/2019   CREATININE 1.15 07/30/2019   BUN 24 (H) 07/30/2019   CO2 24 07/30/2019   TSH 3.37 04/12/2019   PSA 1.85 04/12/2019   INR 1.19 08/27/2018   HGBA1C 7.5 (H) 04/12/2019   MICROALBUR 3.2 (H) 01/22/2015     BNP (last 3 results) Recent Labs    04/27/19 1502 06/07/19 1007  BNP 630.0* 1,011.1*    ProBNP (last 3 results) No results for input(s): PROBNP in the last 8760 hours.   Other Studies Reviewed Today:  ECHO IMPRESSIONS 05/2019  1. Left ventricular ejection fraction, by visual estimation, is 25 to  30%. The left ventricle has severely decreased function. There is no left  ventricular hypertrophy.  2.  Aortic root aneurysm  present up to 62 mm with dilated of the R sinus  of valsalva. Appears to be stable from most recent cardiac CTA.  3. A 33 mm metronic mosaic bioprosthetic mitral valve is present. The  mean gradient across the valve is 5 mmHg at 74 bpm. EOA by VTI is 1.4 cm2.  There is trace regurgitation which is likely paravalvular. Gradients  stable from prior study.  4. Abnormal septal motion consistent with post-operative status.  5. Left ventricular diastolic function could not be evaluated.  6. Mildly dilated left ventricular internal cavity size.  7. The left ventricle demonstrates regional wall motion abnormalities.  8. Apical akinesis present.  9. Global right ventricle has severely reduced systolic function.The  right ventricular size is mildly enlarged. No increase in right  ventricular wall thickness.  10. Left atrial size was severely dilated.  11. Right atrial size was severely dilated.  12. The mitral valve has been repaired/replaced. Trace mitral valve  regurgitation.  13. The tricuspid valve is grossly normal. Tricuspid valve regurgitation  is trivial.  14. The aortic valve is tricuspid. Aortic valve regurgitation is not  visualized. No evidence of aortic valve sclerosis or stenosis.  15. The pulmonic valve was grossly normal. Pulmonic valve regurgitation is  not visualized.  16. Aneurysm of the aortic root, measuring 62 mm.  17. Aortic dilatation noted.  18. TR signal is inadequate for assessing pulmonary artery systolic  pressure.  19. A pacer wire is visualized in the RA and RV.  20. The inferior vena cava is dilated in size with <50% respiratory  variability, suggesting right atrial pressure of 15 mmHg.   In comparison to the previous echocardiogram(s): No significant change  from 04/28/2019.    ASSESSMENT & PLAN:   1. NICM - with chronic systolic HF - he is a DNR - he is felt to be end stage but overall seems to be holding his own. Very tenuous situation. Not on  ideal therapy given soft BP, orthostasis, etc.   2. Persistent AF - has underlying BiV/ICD in place - not a candidate for anticoagulation due to prior intracranial bleed from 2009 - he has had clipping of the LA appendage. HR ok today. He remains on low dose Toprol without issue. Will continue.   3. Prior MV replacement with bioprosthetic valve due to endocarditis - last echo noted.   4. 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 Roxy Manns - no plans for intervention.   5. COPD - he remains on chronic oxygen - he has this on today.   6. CAD - remote inferior MI 2010 - normal coronaries - no active chest pain noted. Would favor conservative management.   7. History of orthostatic hypotension   8. Non small cell lung cancer - s/p wedge resection - followed by Oncology - not discussed but adds to his overall tenuous situation.   9. Chronic bilirubin elevation - not discussed.   10. Underlying BiV/ICD - followed by EP - no shocks noted.   59. COVID-19 Education: The signs and symptoms of COVID-19 were discussed with the patient and how to seek care for testing (follow up with PCP or arrange E-visit).  The importance of social distancing, staying at home, hand hygiene and wearing a mask when out in public were discussed today.  Current medicines are reviewed with the patient today.  The patient does not have concerns regarding medicines other than  what has been noted above.  The following changes have been made:  See above.  Labs/ tests ordered today include:   No orders of the defined types were placed in this encounter.    Disposition:   FU with Dr. Marlou Porch in 2 months.   Patient is agreeable to this plan and will call if any problems develop in the interim.   SignedTruitt Merle, NP  08/19/2019 4:07 PM  [facility] [address] [place], [place]  [address] Phone: [phone] Fax: [phone]

## 2019-08-19 ENCOUNTER — Ambulatory Visit: Payer: Medicare Other | Admitting: Nurse Practitioner

## 2019-08-19 ENCOUNTER — Ambulatory Visit (INDEPENDENT_AMBULATORY_CARE_PROVIDER_SITE_OTHER): Payer: Medicare Other | Admitting: Nurse Practitioner

## 2019-08-19 ENCOUNTER — Encounter: Payer: Self-pay | Admitting: Nurse Practitioner

## 2019-08-19 ENCOUNTER — Other Ambulatory Visit: Payer: Self-pay

## 2019-08-19 VITALS — BP 108/60 | HR 72 | Ht 73.0 in | Wt 151.0 lb

## 2019-08-19 DIAGNOSIS — I259 Chronic ischemic heart disease, unspecified: Secondary | ICD-10-CM

## 2019-08-19 DIAGNOSIS — I482 Chronic atrial fibrillation, unspecified: Secondary | ICD-10-CM

## 2019-08-19 DIAGNOSIS — I719 Aortic aneurysm of unspecified site, without rupture: Secondary | ICD-10-CM | POA: Diagnosis not present

## 2019-08-19 DIAGNOSIS — Z7189 Other specified counseling: Secondary | ICD-10-CM

## 2019-08-19 DIAGNOSIS — I5042 Chronic combined systolic (congestive) and diastolic (congestive) heart failure: Secondary | ICD-10-CM | POA: Diagnosis not present

## 2019-08-19 DIAGNOSIS — I7121 Aneurysm of the ascending aorta, without rupture: Secondary | ICD-10-CM

## 2019-08-19 NOTE — Patient Instructions (Addendum)
After Visit Summary:  We will be checking the following labs today - NONE   Medication Instructions:    Continue with your current medicines.    If you need a refill on your cardiac medications before your next appointment, please call your pharmacy.     Testing/Procedures To Be Arranged:  N/A  Follow-Up:   See Dr. Marlou Porch in two months.    At [facility], you and your health needs are our priority.  As part of our continuing mission to provide you with exceptional heart care, we have created designated Provider Care Teams.  These Care Teams include your primary Cardiologist (physician) and Advanced Practice Providers (APPs -  Physician Assistants and Nurse Practitioners) who all work together to provide you with the care you need, when you need it.  Special Instructions:  . Stay safe, stay home, wash your hands for at least 20 seconds and wear a mask when out in public.  . It was good to talk with you today.    Call the [place] office at [phone] if you have any questions, problems or concerns.

## 2019-08-28 ENCOUNTER — Ambulatory Visit (INDEPENDENT_AMBULATORY_CARE_PROVIDER_SITE_OTHER): Payer: Medicare Other | Admitting: *Deleted

## 2019-08-28 DIAGNOSIS — I482 Chronic atrial fibrillation, unspecified: Secondary | ICD-10-CM | POA: Diagnosis not present

## 2019-08-28 LAB — CUP PACEART REMOTE DEVICE CHECK
Battery Remaining Longevity: 23 mo
Battery Remaining Percentage: 25 %
Battery Voltage: 2.81 V
Date Time Interrogation Session: 20210217040015
HighPow Impedance: 60 Ohm
HighPow Impedance: 60 Ohm
Implantable Lead Implant Date: 20100113
Implantable Lead Implant Date: 20100113
Implantable Lead Location: 753858
Implantable Lead Location: 753860
Implantable Lead Model: 7122
Implantable Pulse Generator Implant Date: 20130729
Lead Channel Impedance Value: 360 Ohm
Lead Channel Impedance Value: 680 Ohm
Lead Channel Pacing Threshold Amplitude: 0.625 V
Lead Channel Pacing Threshold Amplitude: 0.625 V
Lead Channel Pacing Threshold Pulse Width: 0.5 ms
Lead Channel Pacing Threshold Pulse Width: 0.5 ms
Lead Channel Sensing Intrinsic Amplitude: 10.2 mV
Lead Channel Setting Pacing Amplitude: 2 V
Lead Channel Setting Pacing Amplitude: 2 V
Lead Channel Setting Pacing Pulse Width: 0.5 ms
Lead Channel Setting Pacing Pulse Width: 0.5 ms
Lead Channel Setting Sensing Sensitivity: 0.5 mV
Pulse Gen Serial Number: 7053988

## 2019-08-28 NOTE — Progress Notes (Signed)
ICD Remote  

## 2019-09-02 ENCOUNTER — Other Ambulatory Visit: Payer: Self-pay | Admitting: Adult Health

## 2019-09-04 NOTE — Telephone Encounter (Signed)
Dosage changed during hospitalization stay on 06/07/2019.  Released on 06/10/2019.  Should pt continue new dose?

## 2019-09-04 NOTE — Telephone Encounter (Signed)
Sent to the pharmacy by e-scribe. 

## 2019-09-04 NOTE — Telephone Encounter (Signed)
Ok to refill current dose of 40 mg BID for one year

## 2019-09-06 ENCOUNTER — Other Ambulatory Visit: Payer: Self-pay

## 2019-09-06 ENCOUNTER — Ambulatory Visit (INDEPENDENT_AMBULATORY_CARE_PROVIDER_SITE_OTHER): Payer: Medicare Other | Admitting: Adult Health

## 2019-09-06 ENCOUNTER — Encounter: Payer: Self-pay | Admitting: Adult Health

## 2019-09-06 VITALS — Temp 98.3°F | Wt 150.0 lb

## 2019-09-06 DIAGNOSIS — I259 Chronic ischemic heart disease, unspecified: Secondary | ICD-10-CM

## 2019-09-06 DIAGNOSIS — R300 Dysuria: Secondary | ICD-10-CM

## 2019-09-06 MED ORDER — AMOXICILLIN-POT CLAVULANATE 875-125 MG PO TABS: 1.0000 | ORAL_TABLET | Freq: Two times a day (BID) | ORAL | 0 refills | Status: AC

## 2019-09-06 NOTE — Progress Notes (Signed)
Subjective:    Patient ID: [name], male    DOB: 11-Feb-1946, 74 y.o.   MRN: [id number]  HPI   74 year old male who  has a past medical history of Atrial septal defect, Automatic implantable cardioverter-defibrillator in situ, Chronic combined systolic and diastolic CHF (congestive heart failure) ([place]), Colon polyps, COPD (chronic obstructive pulmonary disease) ([place]), CVA (cerebral vascular accident) ([place]) (2009), Diabetes mellitus without complication ([place]), Dyslipidemia, Endocarditis, Hypertension, Intracranial hemorrhage ([place]), Lung cancer ([place]) (11/29/2013), Myocardial infarction ([place]) (2010), Pacemaker, Permanent atrial fibrillation, Pneumonia (07/2018), Pulmonary hypertension ([place]), Renal artery stenosis (HCC), 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  He presents to the clinic today for an acute issue of 2 to 3 days of hematuria.  Reports "it is really painful when I urinate".  Associated symptoms include frequency, urgency, and lower pelvic pain.  He denies fevers, chills, or blood in his urine.  He has not had any constipation or diarrhea  He does have a remote history of kidney stones   Review of Systems See HPI   Past Medical History:  Diagnosis Date  . Atrial septal defect    Closed with surgery January, 2010  . Automatic implantable cardioverter-defibrillator in situ    LV dysfunction and pacer needed for AV node lesion  . Chronic combined systolic and diastolic CHF (congestive heart failure) ([place])   . Colon polyps   . COPD (chronic obstructive pulmonary disease) (HCC)    O2- 2 liters, nasal cannula, q night   . CVA (cerebral vascular accident) ([place]) 2009   denies residual on 08/14/2013  . Diabetes mellitus without complication ([place])    type 2  . Dyslipidemia   . Endocarditis    Bacterial, 2009  . Hypertension   .  Intracranial hemorrhage (HCC)    Coumadin cannot be used because of the history of his bleed  . Lung cancer ([place]) 11/29/2013   T1N0 Stage Ia non-small cell carcinoma left lung treated with wedge resection  . Myocardial infarction ([place]) 2010  . Pacemaker    combo pacer and icd  . Permanent atrial fibrillation    Originally Coumadin use for atrial fibrillation  //   he had intracerebral hemorrhage with an INR of 2.3 June, 2009. Anticoagulation could no longer be used.  //  Rapid atrial fibrillation after inferior MI October, 2010..........Marland Kitchen AV node ablation done at that time with ICD pacemaker placed (EF 35%).   //   Left atrial appendage tied off at the time of mitral valve surgery January, 2010 (maze pro  . Pneumonia 07/2018  . Pulmonary hypertension (HCC)    Moderate  . Renal artery stenosis (HCC)    Mild by history  . 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 Spontaneous pneumothorax    right thoracotomy - distant past  . Status post minimally invasive mitral valve replacement with bioprosthetic valve    33 mm Medtronic Mosaic porcine bioprosthesis placed via right mini thoracotomy for bacterial endocarditis complicated by severe MR and CHF   . Thoracic aortic aneurysm ([place]) 08/11/2016   a - Chest CTA 1/18:  Aneurysmal dilatation of aortic root is noted at 5.1 cm.     Social History   Socioeconomic History  . Marital status: Married    Spouse name: [name]  . Number of children: 2  . Years of education: College  . Highest education level: Not on file  Occupational History  . 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 Smoking status: Former Smoker    Packs/day: 1.00    Years: 45.00    Pack years: 45.00    Types: Cigarettes    Quit date: 2011    Years since quitting: 10.1  . Smokeless tobacco: Never Used  Substance and Sexual Activity  . Alcohol use: No    Alcohol/week: 0.0 standard drinks    Comment: 08/14/2013 "used to drink beer;  quit:in 1982"  . Drug use: No  . Sexual activity: Yes  Other Topics Concern  . Not on file  Social History Narrative   Patient lives at home with his spouse.   Worked for the post office   Has two boys and a girl. All live local.    1 Mining engineer   Social Determinants of Health   Financial Resource Strain: Low Risk   . Difficulty of Paying Living Expenses: Not hard at all  Food Insecurity: No Food Insecurity  . Worried About Charity fundraiser in the Last Year: Never true  . Ran Out of Food in the Last Year: Never true  Transportation Needs: No Transportation Needs  . Lack of Transportation (Medical): No  . Lack of Transportation (Non-Medical): No  Physical Activity: Inactive  . Days of Exercise per Week: 0 days  . Minutes of Exercise per Session: 0 min  Stress: No Stress Concern Present  . Feeling of Stress : Only a little  Social Connections: Unknown  . Frequency of Communication with Friends and Family: More than three times a week  . Frequency of Social Gatherings with Friends and Family: Three times a week  . Attends Religious Services: Not on file  . Active Member of Clubs or Organizations: Not on file  . Attends Archivist Meetings: Not on file  . Marital Status: Married  Human resources officer Violence:   . Fear of Current or Ex-Partner: Not on file  . Emotionally Abused: Not on file  . Physically Abused: Not on file  . Sexually Abused: Not on file    Past Surgical History:  Procedure Laterality Date  . APPENDECTOMY    . ASD REPAIR, SECUNDUM  07/17/2008   pericardial patch closure of ASD  . AV NODE ABLATION  07/2008   for rapid atrial fib  . CARDIAC CATHETERIZATION    . CARDIAC DEFIBRILLATOR PLACEMENT  ~ [address] [place]  . CARDIAC VALVE REPLACEMENT    . CATARACT EXTRACTION W/ INTRAOCULAR LENS  IMPLANT, BILATERAL Bilateral   . ESOPHAGOGASTRODUODENOSCOPY (EGD) WITH PROPOFOL N/A 09/29/2016   Procedure: ESOPHAGOGASTRODUODENOSCOPY (EGD) WITH  PROPOFOL;  Surgeon: [name], MD;  Location: [facility] ENDOSCOPY;  Service: Endoscopy;  Laterality: N/A;  . HERNIA REPAIR    . IMPLANTABLE CARDIOVERTER DEFIBRILLATOR (ICD) GENERATOR CHANGE N/A 02/06/2012   Procedure: ICD GENERATOR CHANGE;  Surgeon: [name], MD;  Location: [facility] C[facility] LAB;  Service: Cardiovascular;  Laterality: N/A;  . INSERT / REPLACE / REMOVE PACEMAKER    . MASS BIOPSY Left    neck mass  . MITRAL VALVE REPLACEMENT Right 07/17/2008   16mm Medtronic Mosaic porcine bioprosthesis  . PENILE PROSTHESIS IMPLANT    . RIGHT HEART CATHETERIZATION N/A 08/16/2013   Procedure: RIGHT HEART CATH;  Surgeon: Josue Hector, MD;  Location: [facility] CATH LAB;  Service: Cardiovascular;  Laterality: N/A;  . TEE WITHOUT CARDIOVERSION N/A 09/06/2016  Procedure: TRANSESOPHAGEAL ECHOCARDIOGRAM (TEE);  Surgeon: Dorothy Spark, MD;  Location: [facility] ENDOSCOPY;  Service: Cardiovascular;  Laterality: N/A;  . THORACOTOMY Right 1970's   spontaneous pneumothorax - while in the [place]  . TOE SURGERY     left foot hammer toe  . TONSILLECTOMY    . VIDEO ASSISTED THORACOSCOPY (VATS)/WEDGE RESECTION Left 11/29/2013   Procedure: Video assisted thoracoscopy for wedge resection; mini thoracotomy;  Surgeon: [name], MD;  Location: [facility] OR;  Service: Thoracic;  Laterality: Left;    Family History  Problem Relation Age of Onset  . Stomach cancer Father   . Stroke Mother     Allergies  Allergen Reactions  . Anticoagulant Compound Other (See Comments)    Pt had intracranial bleed, therefore all anticoagulation is contraindicated per Dr. Ron Parker  . Other Other (See Comments)    Per Dr. Halford Chessman (Surgeon): stated that the patient cannot be put under for any surgery, as he has an enlarged aorta. He would stand only a 50/50 chance of surviving. He has lung issues, diminished lung tissue, COPD, and emphysema.  . Warfarin Sodium Other (See Comments)    Pt had intracranial bleed, therefore all anticoagulation is  contraindicated per Dr. Ron Parker    Current Outpatient Medications on File Prior to Visit  Medication Sig Dispense Refill  . acetaminophen (TYLENOL) 500 MG tablet Take 500-1,000 mg by mouth every 8 (eight) hours as needed for moderate pain or headache.     . albuterol (PROVENTIL HFA) 108 (90 Base) MCG/ACT inhaler Inhale 2 puffs into the lungs every 6 (six) hours as needed for wheezing or shortness of breath.    Marland Kitchen aspirin EC 81 MG tablet Take 1 tablet (81 mg total) by mouth daily. 90 tablet 3  . atorvastatin (LIPITOR) 10 MG tablet Take 10 mg by mouth daily.    . budesonide-formoterol (SYMBICORT) 160-4.5 MCG/ACT inhaler INHALE 2 PUFFS INTO THE LUNGS TWICE A DAY 10.2 Inhaler 6  . furosemide (LASIX) 40 MG tablet TAKE 1 TABLET BY MOUTH TWICE A DAY 180 tablet 3  . metFORMIN (GLUCOPHAGE) 1000 MG tablet TAKE 1 TABLET BY MOUTH TWICE A DAY WITH MEALS 180 tablet 0  . metoprolol succinate (TOPROL-XL) 25 MG 24 hr tablet Take 0.5 tablets (12.5 mg total) by mouth daily. 30 tablet 0  . nitroGLYCERIN (NITROSTAT) 0.4 MG SL tablet Place 0.4 mg under the tongue every 5 (five) minutes x 3 doses as needed for chest pain.     . OXYGEN Inhale 2-3 L/min into the lungs continuous.     . potassium chloride SA (KLOR-CON) 20 MEQ tablet Take 2 tablets (40 mEq total) by mouth daily. 180 tablet 1   No current facility-administered medications on file prior to visit.    Temp 98.3 F (36.8 C) (Temporal)   Wt 150 lb (68 kg)   BMI 19.79 kg/m       Objective:   Physical Exam Vitals and nursing note reviewed.  Constitutional:      Appearance: Normal appearance.  Cardiovascular:     Heart sounds: Normal heart sounds.  Abdominal:     General: Abdomen is flat. Bowel sounds are normal.     Palpations: Abdomen is soft.     Tenderness: There is abdominal tenderness in the suprapubic area. There is right CVA tenderness (mild). There is no left CVA tenderness.  Skin:    General: Skin is warm and dry.     Capillary Refill:  Capillary refill takes less than 2 seconds.  Neurological:     General: No focal deficit present.     Mental Status: He is alert and oriented to person, place, and time.  Psychiatric:        Mood and Affect: Mood normal.        Behavior: Behavior normal.        Thought Content: Thought content normal.        Judgment: Judgment normal.        Assessment & Plan:  1. Dysuria -Patient was unable to urinate in the office today.  Will prescribe Augmentin for likely UTI over the weekend.  He was advised to follow-up if no improvement in the next 2 to 3 days - amoxicillin-clavulanate (AUGMENTIN) 875-125 MG tablet; Take 1 tablet by mouth 2 (two) times daily for 5 days.  Dispense: 10 tablet; Refill: 0  [name], NP

## 2019-09-12 ENCOUNTER — Other Ambulatory Visit: Payer: Self-pay | Admitting: Adult Health

## 2019-09-12 DIAGNOSIS — E119 Type 2 diabetes mellitus without complications: Secondary | ICD-10-CM

## 2019-09-13 NOTE — Telephone Encounter (Signed)
6 months for him... he would be due to April

## 2019-09-16 ENCOUNTER — Ambulatory Visit: Payer: Medicare Other | Admitting: Cardiology

## 2019-10-16 ENCOUNTER — Ambulatory Visit (INDEPENDENT_AMBULATORY_CARE_PROVIDER_SITE_OTHER): Payer: Medicare Other | Admitting: Cardiology

## 2019-10-16 ENCOUNTER — Encounter: Payer: Self-pay | Admitting: Cardiology

## 2019-10-16 ENCOUNTER — Other Ambulatory Visit: Payer: Self-pay

## 2019-10-16 ENCOUNTER — Telehealth: Payer: Self-pay | Admitting: Family Medicine

## 2019-10-16 VITALS — BP 94/30 | HR 70 | Ht 73.0 in | Wt 149.0 lb

## 2019-10-16 DIAGNOSIS — I259 Chronic ischemic heart disease, unspecified: Secondary | ICD-10-CM | POA: Diagnosis not present

## 2019-10-16 DIAGNOSIS — Z95810 Presence of automatic (implantable) cardiac defibrillator: Secondary | ICD-10-CM

## 2019-10-16 DIAGNOSIS — I482 Chronic atrial fibrillation, unspecified: Secondary | ICD-10-CM

## 2019-10-16 DIAGNOSIS — J449 Chronic obstructive pulmonary disease, unspecified: Secondary | ICD-10-CM

## 2019-10-16 DIAGNOSIS — Q2549 Other congenital malformations of aorta: Secondary | ICD-10-CM | POA: Diagnosis not present

## 2019-10-16 DIAGNOSIS — I428 Other cardiomyopathies: Secondary | ICD-10-CM

## 2019-10-16 DIAGNOSIS — I5042 Chronic combined systolic (congestive) and diastolic (congestive) heart failure: Secondary | ICD-10-CM | POA: Diagnosis not present

## 2019-10-16 DIAGNOSIS — Q2543 Congenital aneurysm of aorta: Secondary | ICD-10-CM

## 2019-10-16 NOTE — Progress Notes (Signed)
Cardiology Office Note:    Date:  10/16/2019   ID:  [name], DOB Oct 10, 1945, MRN [id number]  PCP:  [name], NP  Cardiologist:  Candee Furbish, MD  Electrophysiologist:  Cristopher Peru, MD   Referring MD: [name], NP     History of Present Illness:    [name] is a 74 y.o. male here for follow-up of mitral valve replacement bioprosthetic valve persistent atrial fibrillation prior AV nodal ablation and ICD/pacemaker prior brain bleed not on anticoagulation.  Has left atrial appendage occlusion/tied.  Echo EF 25 to 30%.  Aortic root 62 mm  Chronic bilirubin elevation.  COPD diabetes large sinus of Valsalva aneurysm.  Saw Dr. Roxy Manns in June 2020 for his aneurysm felt to be stable no plans for any intervention given his numerous comorbidities.  He was also seen by palliative care, DNR but continue with current therapies.  Overall been doing fairly well.  No fevers chills nausea vomiting syncope bleeding.  Stable breathing.  Still enjoying driving.  No syncope no bleeding no orthopnea.  Past Medical History:  Diagnosis Date  . Atrial septal defect    Closed with surgery January, 2010  . Automatic implantable cardioverter-defibrillator in situ    LV dysfunction and pacer needed for AV node lesion  . Chronic combined systolic and diastolic CHF (congestive heart failure) ([place])   . Colon polyps   . COPD (chronic obstructive pulmonary disease) (HCC)    O2- 2 liters, nasal cannula, q night   . CVA (cerebral vascular accident) ([place]) 2009   denies residual on 08/14/2013  . Diabetes mellitus without complication ([place])    type 2  . Dyslipidemia   . Endocarditis    Bacterial, 2009  . Hypertension   . Intracranial hemorrhage (HCC)    Coumadin cannot be used because of the history of his bleed  . Lung cancer ([place]) 11/29/2013   T1N0 Stage Ia non-small cell carcinoma left lung treated with wedge resection  . Myocardial infarction ([place]) 2010  . Pacemaker    combo pacer and  icd  . Permanent atrial fibrillation    Originally Coumadin use for atrial fibrillation  //   he had intracerebral hemorrhage with an INR of 2.3 June, 2009. Anticoagulation could no longer be used.  //  Rapid atrial fibrillation after inferior MI October, 2010..........Marland Kitchen AV node ablation done at that time with ICD pacemaker placed (EF 35%).   //   Left atrial appendage tied off at the time of mitral valve surgery January, 2010 (maze pro  . Pneumonia 07/2018  . Pulmonary hypertension (HCC)    Moderate  . Renal artery stenosis (HCC)    Mild by history  . 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 Spontaneous pneumothorax    right thoracotomy - distant past  . Status post minimally invasive mitral valve replacement with bioprosthetic valve    33 mm Medtronic Mosaic porcine bioprosthesis placed via right mini thoracotomy for bacterial endocarditis complicated by severe MR and CHF   . Thoracic aortic aneurysm ([place]) 08/11/2016   a - Chest CTA 1/18:  Aneurysmal dilatation of aortic root is noted at 5.1 cm.     Past Surgical History:  Procedure Laterality Date  . APPENDECTOMY    . ASD REPAIR, SECUNDUM  07/17/2008   pericardial patch closure of ASD  . AV NODE ABLATION  07/2008   for rapid atrial fib  . CARDIAC CATHETERIZATION    . CARDIAC DEFIBRILLATOR PLACEMENT  ~ 2010  [employer] Jude  . CARDIAC VALVE REPLACEMENT    . CATARACT EXTRACTION W/ INTRAOCULAR LENS  IMPLANT, BILATERAL Bilateral   . ESOPHAGOGASTRODUODENOSCOPY (EGD) WITH PROPOFOL N/A 09/29/2016   Procedure: ESOPHAGOGASTRODUODENOSCOPY (EGD) WITH PROPOFOL;  Surgeon: [name], MD;  Location: [facility] ENDOSCOPY;  Service: Endoscopy;  Laterality: N/A;  . HERNIA REPAIR    . IMPLANTABLE CARDIOVERTER DEFIBRILLATOR (ICD) GENERATOR CHANGE N/A 02/06/2012   Procedure: ICD GENERATOR CHANGE;  Surgeon: [name], MD;  Location: [facility] CATH LAB;  Service: Cardiovascular;  Laterality: N/A;  . INSERT / REPLACE / REMOVE PACEMAKER    . MASS BIOPSY Left    neck mass   . MITRAL VALVE REPLACEMENT Right 07/17/2008   57mm Medtronic Mosaic porcine bioprosthesis  . PENILE PROSTHESIS IMPLANT    . RIGHT HEART CATHETERIZATION N/A 08/16/2013   Procedure: RIGHT HEART CATH;  Surgeon: Josue Hector, MD;  Location: [facility] CATH LAB;  Service: Cardiovascular;  Laterality: N/A;  . TEE WITHOUT CARDIOVERSION N/A 09/06/2016   Procedure: TRANSESOPHAGEAL ECHOCARDIOGRAM (TEE);  Surgeon: Dorothy Spark, MD;  Location: [facility] ENDOSCOPY;  Service: Cardiovascular;  Laterality: N/A;  . THORACOTOMY Right 1970's   spontaneous pneumothorax - while in the [place]  . TOE SURGERY     left foot hammer toe  . TONSILLECTOMY    . VIDEO ASSISTED THORACOSCOPY (VATS)/WEDGE RESECTION Left 11/29/2013   Procedure: Video assisted thoracoscopy for wedge resection; mini thoracotomy;  Surgeon: [name], MD;  Location: [facility] OR;  Service: Thoracic;  Laterality: Left;    Current Medications: Current Meds  Medication Sig  . acetaminophen (TYLENOL) 500 MG tablet Take 500-1,000 mg by mouth every 8 (eight) hours as needed for moderate pain or headache.   . albuterol (PROVENTIL HFA) 108 (90 Base) MCG/ACT inhaler Inhale 2 puffs into the lungs every 6 (six) hours as needed for wheezing or shortness of breath.  Marland Kitchen aspirin EC 81 MG tablet Take 1 tablet (81 mg total) by mouth daily.  Marland Kitchen atorvastatin (LIPITOR) 10 MG tablet Take 10 mg by mouth daily.  . budesonide-formoterol (SYMBICORT) 160-4.5 MCG/ACT inhaler INHALE 2 PUFFS INTO THE LUNGS TWICE A DAY  . furosemide (LASIX) 40 MG tablet TAKE 1 TABLET BY MOUTH TWICE A DAY  . metFORMIN (GLUCOPHAGE) 1000 MG tablet TAKE 1 TABLET BY MOUTH TWICE A DAY WITH MEALS  . metoprolol succinate (TOPROL-XL) 25 MG 24 hr tablet Take 0.5 tablets (12.5 mg total) by mouth daily.  . nitroGLYCERIN (NITROSTAT) 0.4 MG SL tablet Place 0.4 mg under the tongue every 5 (five) minutes x 3 doses as needed for chest pain.   . OXYGEN Inhale 2-3 L/min into the lungs continuous.   . potassium chloride  SA (KLOR-CON) 20 MEQ tablet Take 2 tablets (40 mEq total) by mouth daily.     Allergies:   Anticoagulant compound, Other, and Warfarin sodium   Social History   Socioeconomic History  . Marital status: Married    Spouse name: [name]  . Number of children: 2  . Years of education: College  . Highest education level: Not on file  Occupational History  . 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 Smoking status: Former Smoker    Packs/day: 1.00    Years: 45.00    Pack years: 45.00    Types: Cigarettes    Quit date: 2011    Years since quitting: 10.2  . Smokeless tobacco: Never Used  Substance and Sexual Activity  .  Alcohol use: No    Alcohol/week: 0.0 standard drinks    Comment: 08/14/2013 "used to drink beer; quit:in 1982"  . Drug use: No  . Sexual activity: Yes  Other Topics Concern  . Not on file  Social History Narrative   Patient lives at home with his spouse.   Worked for the post [employer]   Has two boys and a girl. All live local.    1 Mining engineer   Social Determinants of Health   Financial Resource Strain: Low Risk   . Difficulty of Paying Living Expenses: Not hard at all  Food Insecurity: No Food Insecurity  . Worried About Charity fundraiser in the Last Year: Never true  . Ran Out of Food in the Last Year: Never true  Transportation Needs: No Transportation Needs  . Lack of Transportation (Medical): No  . Lack of Transportation (Non-Medical): No  Physical Activity: Inactive  . Days of Exercise per Week: 0 days  . Minutes of Exercise per Session: 0 min  Stress: No Stress Concern Present  . Feeling of Stress : Only a little  Social Connections: Unknown  . Frequency of Communication with Friends and Family: More than three times a week  . Frequency of Social Gatherings with Friends and Family: Three times a week  . Attends Religious Services: Not on file  . Active Member of Clubs or Organizations: Not on  file  . Attends Archivist Meetings: Not on file  . Marital Status: Married     Family History: The patient's family history includes Stomach cancer in his father; Stroke in his mother.  ROS:   Please see the history of present illness.     All other systems reviewed and are negative.  EKGs/Labs/Other Studies Reviewed:    The following studies were reviewed today: Echo EF 25%  EKG:  EKG is not ordered today.   Recent Labs: 04/12/2019: TSH 3.37 05/02/2019: Magnesium 2.4 06/07/2019: B Natriuretic Peptide 1,011.1 07/30/2019: ALT <6; BUN 24; Creatinine 1.15; Hemoglobin 11.0; Platelet Count 314; Potassium 4.9; Sodium 139  Recent Lipid Panel    Component Value Date/Time   CHOL 85 04/12/2019 0901   TRIG 95.0 04/12/2019 0901   HDL 45.30 04/12/2019 0901   CHOLHDL 2 04/12/2019 0901   VLDL 19.0 04/12/2019 0901   LDLCALC 20 04/12/2019 0901   LDLDIRECT 80.2 03/31/2011 1411    Physical Exam:    VS:  BP (!) 94/30   Pulse 70   Ht 6\' 1"  (1.854 m)   Wt 149 lb (67.6 kg)   SpO2 (!) 84%   BMI 19.66 kg/m     Wt Readings from Last 3 Encounters:  10/16/19 149 lb (67.6 kg)  09/06/19 150 lb (68 kg)  08/19/19 151 lb (68.5 kg)     GEN: Ill-appearing, thin in no acute distress HEENT: Normal NECK: No JVD; No carotid bruits LYMPHATICS: No lymphadenopathy CARDIAC: RRR, no murmurs, rubs, gallops, ICD RESPIRATORY: Poor air movement bilaterally ABDOMEN: Soft, non-tender, non-distended MUSCULOSKELETAL:  No edema; No deformity  SKIN: Warm and dry somewhat jaundice appearance although bilirubin was 2 NEUROLOGIC:  Alert and oriented x 3 PSYCHIATRIC:  Normal affect   ASSESSMENT:    1. Cardiomyopathy, nonischemic ([place])   2. Chronic atrial fibrillation (HCC)   3. Chronic combined systolic and diastolic CHF (congestive heart failure) ([place])   4. Sinus of Valsalva aneurysm   5. ICD (implantable cardioverter-defibrillator), biventricular, in situ   6. COPD GOLD II  PLAN:    In  order of problems listed above:  Nonischemic cardiomyopathy -EF 25 to 30%.  DNR.  End-stage overall but doing reasonably well.  Soft blood pressures.  Orthostatics.  Continuing with Lasix.  Last creatinine was 1.15.  Potassium was 4.9.  Persistent atrial fibrillation -Has BiV ICD.  No anticoagulation.  Clipping left atrial appendage.  AV nodal ablation.  Low-dose Toprol for heart failure.  Mitral valve replacement bioprosthetic -Endocarditis history.  Dental prophylaxis.  Sinus Valsalva aneurysm 61 mm -No plans for intervention.  Dr. Roxy Manns.  Continue with palliative care.  Blood pressure under excellent control.  COPD -Chronic oxygen.  CAD remote inferior MI 2010 with normal coronaries.  Conservative management.  Non-small cell lung cancer -Wedge resection, oncology.  Hyperlipidemia Atorvastatin 10 mg-LDL 31.  Chronic bilirubin elevation -Stable. Jaundice appearing however previous total bilirubin was only 2.0.   DNR status.   Medication Adjustments/Labs and Tests Ordered: Current medicines are reviewed at length with the patient today.  Concerns regarding medicines are outlined above.  No orders of the defined types were placed in this encounter.  No orders of the defined types were placed in this encounter.   Patient Instructions  Medication Instructions:  Your physician recommends that you continue on your current medications as directed. Please refer to the Current Medication list given to you today.  *If you need a refill on your cardiac medications before your next appointment, please call your pharmacy*   Follow-Up: At [facility], you and your health needs are our priority.  As part of our continuing mission to provide you with exceptional heart care, we have created designated Provider Care Teams.  These Care Teams include your primary Cardiologist (physician) and Advanced Practice Providers (APPs -  Physician Assistants and Nurse Practitioners) who all work  together to provide you with the care you need, when you need it.  We recommend signing up for the patient portal called "MyChart".  Sign up information is provided on this After Visit Summary.  MyChart is used to connect with patients for Virtual Visits (Telemedicine).  Patients are able to view lab/test results, encounter notes, upcoming appointments, etc.  Non-urgent messages can be sent to your provider as well.   To learn more about what you can do with MyChart, go to [email or web address].    Your next appointment:   6 month(s)  The format for your next appointment:   In Person  Provider:   Truitt Merle, NP  Follow-Up with Dr. Marlou Porch in 1 year     Signed, Candee Furbish, MD  10/16/2019 1:45 PM    [place]

## 2019-10-16 NOTE — Telephone Encounter (Signed)
Left a message with patient's wife for a return call.

## 2019-10-16 NOTE — Patient Instructions (Signed)
Medication Instructions:  Your physician recommends that you continue on your current medications as directed. Please refer to the Current Medication list given to you today.  *If you need a refill on your cardiac medications before your next appointment, please call your pharmacy*   Follow-Up: At [facility], you and your health needs are our priority.  As part of our continuing mission to provide you with exceptional heart care, we have created designated Provider Care Teams.  These Care Teams include your primary Cardiologist (physician) and Advanced Practice Providers (APPs -  Physician Assistants and Nurse Practitioners) who all work together to provide you with the care you need, when you need it.  We recommend signing up for the patient portal called "MyChart".  Sign up information is provided on this After Visit Summary.  MyChart is used to connect with patients for Virtual Visits (Telemedicine).  Patients are able to view lab/test results, encounter notes, upcoming appointments, etc.  Non-urgent messages can be sent to your provider as well.   To learn more about what you can do with MyChart, go to [email or web address].    Your next appointment:   6 month(s)  The format for your next appointment:   In Person  Provider:   Truitt Merle, NP  Follow-Up with Dr. Marlou Porch in 1 year

## 2019-10-16 NOTE — Telephone Encounter (Signed)
-----   Message from Hulda Humphrey, [place] sent at 09/13/2019 11:52 AM EST ----- Regarding: A1C Pt needs A1C follow up

## 2019-10-16 NOTE — Telephone Encounter (Signed)
Pt scheduled for 10/23/2019.  Nothing further needed.

## 2019-10-22 ENCOUNTER — Other Ambulatory Visit: Payer: Self-pay

## 2019-10-23 ENCOUNTER — Ambulatory Visit: Payer: Medicare Other | Admitting: Adult Health

## 2019-10-23 NOTE — Progress Notes (Deleted)
Subjective:    Patient ID: [name], male    DOB: 11-14-1945, 74 y.o.   MRN: [id number]  HPI  73 year old male who  has a past medical history of Atrial septal defect, Automatic implantable cardioverter-defibrillator in situ, Chronic combined systolic and diastolic CHF (congestive heart failure) ([place]), Colon polyps, COPD (chronic obstructive pulmonary disease) ([place]), CVA (cerebral vascular accident) ([place]) (2009), Diabetes mellitus without complication ([place]), Dyslipidemia, Endocarditis, Hypertension, Intracranial hemorrhage ([place]), Lung cancer ([place]) (11/29/2013), Myocardial infarction ([place]) (2010), Pacemaker, Permanent atrial fibrillation, Pneumonia (07/2018), Pulmonary hypertension ([place]), Renal artery stenosis (HCC), 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   He presents to the office today for follow-up regarding diabetes mellitus.  He is currently maintained on what Metformin 1000 mg twice daily.  During his last visit his A1c had increased from 7.0 - 7.5   Lab Results  Component Value Date   HGBA1C 7.5 (H) 04/12/2019   Review of Systems  Constitutional: Negative.   HENT: Negative.   Eyes: Negative.   Respiratory: Negative.   Cardiovascular: Negative.   Gastrointestinal: Negative.   Endocrine: Negative.   Genitourinary: Negative.   Musculoskeletal: Negative.   Skin: Negative.   Allergic/Immunologic: Negative.   Neurological: Negative.   Hematological: Negative.   Psychiatric/Behavioral: Negative.   All other systems reviewed and are negative.  Past Medical History:  Diagnosis Date  . Atrial septal defect    Closed with surgery January, 2010  . Automatic implantable cardioverter-defibrillator in situ    LV dysfunction and pacer needed for AV node lesion  . Chronic combined systolic and diastolic CHF (congestive heart  failure) ([place])   . Colon polyps   . COPD (chronic obstructive pulmonary disease) (HCC)    O2- 2 liters, nasal cannula, q night   . CVA (cerebral vascular accident) ([place]) 2009   denies residual on 08/14/2013  . Diabetes mellitus without complication ([place])    type 2  . Dyslipidemia   . Endocarditis    Bacterial, 2009  . Hypertension   . Intracranial hemorrhage (HCC)    Coumadin cannot be used because of the history of his bleed  . Lung cancer ([place]) 11/29/2013   T1N0 Stage Ia non-small cell carcinoma left lung treated with wedge resection  . Myocardial infarction ([place]) 2010  . Pacemaker    combo pacer and icd  . Permanent atrial fibrillation    Originally Coumadin use for atrial fibrillation  //   he had intracerebral hemorrhage with an INR of 2.3 June, 2009. Anticoagulation could no longer be used.  //  Rapid atrial fibrillation after inferior MI October, 2010..........Marland Kitchen AV node ablation done at that time with ICD pacemaker placed (EF 35%).   //   Left atrial appendage tied off at the time of mitral valve surgery January, 2010 (maze pro  . Pneumonia 07/2018  . Pulmonary hypertension (HCC)    Moderate  . Renal artery stenosis (HCC)    Mild by history  . 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 Spontaneous pneumothorax    right thoracotomy - distant past  . Status post minimally invasive mitral valve replacement with bioprosthetic valve    33 mm Medtronic Mosaic porcine bioprosthesis placed via right mini thoracotomy for bacterial endocarditis complicated by severe MR and CHF   . Thoracic aortic aneurysm ([place]) 08/11/2016   a - Chest CTA 1/18:  Aneurysmal dilatation of aortic root is  noted at 5.1 cm.     Social History   Socioeconomic History  . Marital status: Married    Spouse name: [name]  . Number of children: 2  . Years of education: College  . Highest education level: Not on file  Occupational History  . 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 Smoking status: Former Smoker    Packs/day: 1.00    Years: 45.00    Pack years: 45.00    Types: Cigarettes    Quit date: 2011    Years since quitting: 10.2  . Smokeless tobacco: Never Used  Substance and Sexual Activity  . Alcohol use: No    Alcohol/week: 0.0 standard drinks    Comment: 08/14/2013 "used to drink beer; quit:in 1982"  . Drug use: No  . Sexual activity: Yes  Other Topics Concern  . Not on file  Social History Narrative   Patient lives at home with his spouse.   Worked for the post office   Has two boys and a girl. All live local.    1 Mining engineer   Social Determinants of Health   Financial Resource Strain: Low Risk   . Difficulty of Paying Living Expenses: Not hard at all  Food Insecurity: No Food Insecurity  . Worried About Charity fundraiser in the Last Year: Never true  . Ran Out of Food in the Last Year: Never true  Transportation Needs: No Transportation Needs  . Lack of Transportation (Medical): No  . Lack of Transportation (Non-Medical): No  Physical Activity: Inactive  . Days of Exercise per Week: 0 days  . Minutes of Exercise per Session: 0 min  Stress: No Stress Concern Present  . Feeling of Stress : Only a little  Social Connections: Unknown  . Frequency of Communication with Friends and Family: More than three times a week  . Frequency of Social Gatherings with Friends and Family: Three times a week  . Attends Religious Services: Not on file  . Active Member of Clubs or Organizations: Not on file  . Attends Archivist Meetings: Not on file  . Marital Status: Married  Human resources officer Violence:   . Fear of Current or Ex-Partner:   . Emotionally Abused:   Marland Kitchen Physically Abused:   . Sexually Abused:     Past Surgical History:  Procedure Laterality Date  . APPENDECTOMY    . ASD REPAIR, SECUNDUM  07/17/2008   pericardial patch closure of ASD  . AV NODE ABLATION  07/2008   for rapid atrial fib  . CARDIAC  CATHETERIZATION    . CARDIAC DEFIBRILLATOR PLACEMENT  ~ [address] [facility]  . CARDIAC VALVE REPLACEMENT    . CATARACT EXTRACTION W/ INTRAOCULAR LENS  IMPLANT, BILATERAL Bilateral   . ESOPHAGOGASTRODUODENOSCOPY (EGD) WITH PROPOFOL N/A 09/29/2016   Procedure: ESOPHAGOGASTRODUODENOSCOPY (EGD) WITH PROPOFOL;  Surgeon: [name], MD;  Location: [facility] ENDOSCOPY;  Service: Endoscopy;  Laterality: N/A;  . HERNIA REPAIR    . IMPLANTABLE CARDIOVERTER DEFIBRILLATOR (ICD) GENERATOR CHANGE N/A 02/06/2012   Procedure: ICD GENERATOR CHANGE;  Surgeon: [name], MD;  Location: [facility] CATH LAB;  Service: Cardiovascular;  Laterality: N/A;  . INSERT / REPLACE / REMOVE PACEMAKER    . MASS BIOPSY Left    neck mass  . MITRAL VALVE REPLACEMENT Right 07/17/2008   59mm Medtronic Mosaic porcine bioprosthesis  . PENILE PROSTHESIS IMPLANT    .  RIGHT HEART CATHETERIZATION N/A 08/16/2013   Procedure: RIGHT HEART CATH;  Surgeon: Josue Hector, MD;  Location: [facility] CATH LAB;  Service: Cardiovascular;  Laterality: N/A;  . TEE WITHOUT CARDIOVERSION N/A 09/06/2016   Procedure: TRANSESOPHAGEAL ECHOCARDIOGRAM (TEE);  Surgeon: Dorothy Spark, MD;  Location: [facility] ENDOSCOPY;  Service: Cardiovascular;  Laterality: N/A;  . THORACOTOMY Right 1970's   spontaneous pneumothorax - while in the [place]  . TOE SURGERY     left foot hammer toe  . TONSILLECTOMY    . VIDEO ASSISTED THORACOSCOPY (VATS)/WEDGE RESECTION Left 11/29/2013   Procedure: Video assisted thoracoscopy for wedge resection; mini thoracotomy;  Surgeon: [name], MD;  Location: [facility] OR;  Service: Thoracic;  Laterality: Left;    Family History  Problem Relation Age of Onset  . Stomach cancer Father   . Stroke Mother     Allergies  Allergen Reactions  . Anticoagulant Compound Other (See Comments)    Pt had intracranial bleed, therefore all anticoagulation is contraindicated per Dr. Ron Parker  . Other Other (See Comments)    Per Dr. Halford Chessman (Surgeon): stated that  the patient cannot be put under for any surgery, as he has an enlarged aorta. He would stand only a 50/50 chance of surviving. He has lung issues, diminished lung tissue, COPD, and emphysema.  . Warfarin Sodium Other (See Comments)    Pt had intracranial bleed, therefore all anticoagulation is contraindicated per Dr. Ron Parker    Current Outpatient Medications on File Prior to Visit  Medication Sig Dispense Refill  . acetaminophen (TYLENOL) 500 MG tablet Take 500-1,000 mg by mouth every 8 (eight) hours as needed for moderate pain or headache.     . albuterol (PROVENTIL HFA) 108 (90 Base) MCG/ACT inhaler Inhale 2 puffs into the lungs every 6 (six) hours as needed for wheezing or shortness of breath.    Marland Kitchen aspirin EC 81 MG tablet Take 1 tablet (81 mg total) by mouth daily. 90 tablet 3  . atorvastatin (LIPITOR) 10 MG tablet Take 10 mg by mouth daily.    . budesonide-formoterol (SYMBICORT) 160-4.5 MCG/ACT inhaler INHALE 2 PUFFS INTO THE LUNGS TWICE A DAY 10.2 Inhaler 6  . furosemide (LASIX) 40 MG tablet TAKE 1 TABLET BY MOUTH TWICE A DAY 180 tablet 3  . metFORMIN (GLUCOPHAGE) 1000 MG tablet TAKE 1 TABLET BY MOUTH TWICE A DAY WITH MEALS 180 tablet 0  . metoprolol succinate (TOPROL-XL) 25 MG 24 hr tablet Take 0.5 tablets (12.5 mg total) by mouth daily. 30 tablet 0  . nitroGLYCERIN (NITROSTAT) 0.4 MG SL tablet Place 0.4 mg under the tongue every 5 (five) minutes x 3 doses as needed for chest pain.     . OXYGEN Inhale 2-3 L/min into the lungs continuous.     . potassium chloride SA (KLOR-CON) 20 MEQ tablet Take 2 tablets (40 mEq total) by mouth daily. 180 tablet 1   No current facility-administered medications on file prior to visit.    There were no vitals taken for this visit.      Objective:   Physical Exam Vitals and nursing note reviewed.  Constitutional:      General: He is not in acute distress.    Appearance: Normal appearance. He is well-developed and normal weight.  HENT:     Head:  Normocephalic and atraumatic.     Right Ear: Tympanic membrane, ear canal and external ear normal. There is no impacted cerumen.     Left Ear: Tympanic membrane, ear canal and  external ear normal. There is no impacted cerumen.     Nose: Nose normal. No congestion or rhinorrhea.     Mouth/Throat:     Mouth: Mucous membranes are moist.     Pharynx: Oropharynx is clear. No oropharyngeal exudate or posterior oropharyngeal erythema.  Eyes:     General:        Right eye: No discharge.        Left eye: No discharge.     Extraocular Movements: Extraocular movements intact.     Conjunctiva/sclera: Conjunctivae normal.     Pupils: Pupils are equal, round, and reactive to light.  Neck:     Vascular: No carotid bruit.     Trachea: No tracheal deviation.  Cardiovascular:     Rate and Rhythm: Normal rate and regular rhythm.     Pulses: Normal pulses.     Heart sounds: Normal heart sounds. No murmur. No friction rub. No gallop.   Pulmonary:     Effort: Pulmonary effort is normal. No respiratory distress.     Breath sounds: Normal breath sounds. No stridor. No wheezing, rhonchi or rales.  Chest:     Chest wall: No tenderness.  Abdominal:     General: Bowel sounds are normal. There is no distension.     Palpations: Abdomen is soft. There is no mass.     Tenderness: There is no abdominal tenderness. There is no right CVA tenderness, left CVA tenderness, guarding or rebound.     Hernia: No hernia is present.  Musculoskeletal:        General: No swelling, tenderness, deformity or signs of injury. Normal range of motion.     Right lower leg: No edema.     Left lower leg: No edema.  Lymphadenopathy:     Cervical: No cervical adenopathy.  Skin:    General: Skin is warm and dry.     Capillary Refill: Capillary refill takes less than 2 seconds.     Coloration: Skin is not jaundiced or pale.     Findings: No bruising, erythema, lesion or rash.  Neurological:     General: No focal deficit present.      Mental Status: He is alert and oriented to person, place, and time.     Cranial Nerves: No cranial nerve deficit.     Sensory: No sensory deficit.     Motor: No weakness.     Coordination: Coordination normal.     Gait: Gait normal.     Deep Tendon Reflexes: Reflexes normal.  Psychiatric:        Mood and Affect: Mood normal.        Behavior: Behavior normal.        Thought Content: Thought content normal.        Judgment: Judgment normal.       Assessment & Plan:

## 2019-10-29 ENCOUNTER — Other Ambulatory Visit: Payer: Self-pay

## 2019-10-30 ENCOUNTER — Encounter: Payer: Self-pay | Admitting: Adult Health

## 2019-10-30 ENCOUNTER — Ambulatory Visit (INDEPENDENT_AMBULATORY_CARE_PROVIDER_SITE_OTHER): Payer: Medicare Other | Admitting: Adult Health

## 2019-10-30 VITALS — BP 110/54 | HR 62 | Temp 97.8°F | Wt 152.0 lb

## 2019-10-30 DIAGNOSIS — I259 Chronic ischemic heart disease, unspecified: Secondary | ICD-10-CM | POA: Diagnosis not present

## 2019-10-30 DIAGNOSIS — E119 Type 2 diabetes mellitus without complications: Secondary | ICD-10-CM | POA: Diagnosis not present

## 2019-10-30 NOTE — Progress Notes (Signed)
Subjective:    Patient ID: [name], male    DOB: 1945-12-14, 74 y.o.   MRN: [id number]  HPI   74 year old male who  has a past medical history of Atrial septal defect, Automatic implantable cardioverter-defibrillator in situ, Chronic combined systolic and diastolic CHF (congestive heart failure) ([place]), Colon polyps, COPD (chronic obstructive pulmonary disease) ([place]), CVA (cerebral vascular accident) ([place]) (2009), Diabetes mellitus without complication ([place]), Dyslipidemia, Endocarditis, Hypertension, Intracranial hemorrhage ([place]), Lung cancer ([place]) (11/29/2013), Myocardial infarction ([place]) (2010), Pacemaker, Permanent atrial fibrillation, Pneumonia (07/2018), Pulmonary hypertension ([place]), Renal artery stenosis (HCC), 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  He presents to the office today for follow-up regarding diabetes mellitus.  He is currently prescribed Metformin 1000 mg twice daily.  His last A1c in October 2020 showed an increase from his baseline of 6.5-7.5.  He does not monitor his blood sugars at home.  He denies any hypoglycemic events.  Was advised to watch his diet and there were no medication changed in October 2020.   Review of Systems See HPI   Past Medical History:  Diagnosis Date  . Atrial septal defect    Closed with surgery January, 2010  . Automatic implantable cardioverter-defibrillator in situ    LV dysfunction and pacer needed for AV node lesion  . Chronic combined systolic and diastolic CHF (congestive heart failure) ([place])   . Colon polyps   . COPD (chronic obstructive pulmonary disease) (HCC)    O2- 2 liters, nasal cannula, q night   . CVA (cerebral vascular accident) ([place]) 2009   denies residual on 08/14/2013  . Diabetes mellitus without complication ([place])    type 2  . Dyslipidemia   . Endocarditis    Bacterial, 2009  . Hypertension   . Intracranial hemorrhage (HCC)    Coumadin cannot be used because of the history of his bleed  . Lung cancer ([place]) 11/29/2013   T1N0 Stage Ia non-small cell carcinoma left lung treated with wedge resection  . Myocardial infarction ([place]) 2010  . Pacemaker    combo pacer and icd  . Permanent atrial fibrillation    Originally Coumadin use for atrial fibrillation  //   he had intracerebral hemorrhage with an INR of 2.3 June, 2009. Anticoagulation could no longer be used.  //  Rapid atrial fibrillation after inferior MI October, 2010..........Marland Kitchen AV node ablation done at that time with ICD pacemaker placed (EF 35%).   //   Left atrial appendage tied off at the time of mitral valve surgery January, 2010 (maze pro  . Pneumonia 07/2018  . Pulmonary hypertension (HCC)    Moderate  . Renal artery stenosis (HCC)    Mild by history  . 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 Spontaneous pneumothorax    right thoracotomy - distant past  . Status post minimally invasive mitral valve replacement with bioprosthetic valve    33 mm Medtronic Mosaic porcine bioprosthesis placed via right mini thoracotomy for bacterial endocarditis complicated by severe MR and CHF   . Thoracic aortic aneurysm ([place]) 08/11/2016   a - Chest CTA 1/18:  Aneurysmal dilatation of aortic root is noted at 5.1 cm.     Social History   Socioeconomic History  . Marital status: Married    Spouse name: [name]  . Number of children: 2  . Years of education: College  . Highest education level: Not on  file  Occupational History  . 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 Smoking status: Former Smoker    Packs/day: 1.00    Years: 45.00    Pack years: 45.00    Types: Cigarettes    Quit date: 2011    Years since quitting: 10.3  . Smokeless tobacco: Never Used  Substance and Sexual Activity  . Alcohol use: No    Alcohol/week: 0.0 standard drinks     Comment: 08/14/2013 "used to drink beer; quit:in 1982"  . Drug use: No  . Sexual activity: Yes  Other Topics Concern  . Not on file  Social History Narrative   Patient lives at home with his spouse.   Worked for the post office   Has two boys and a girl. All live local.    1 Mining engineer   Social Determinants of Health   Financial Resource Strain: Low Risk   . Difficulty of Paying Living Expenses: Not hard at all  Food Insecurity: No Food Insecurity  . Worried About Charity fundraiser in the Last Year: Never true  . Ran Out of Food in the Last Year: Never true  Transportation Needs: No Transportation Needs  . Lack of Transportation (Medical): No  . Lack of Transportation (Non-Medical): No  Physical Activity: Inactive  . Days of Exercise per Week: 0 days  . Minutes of Exercise per Session: 0 min  Stress: No Stress Concern Present  . Feeling of Stress : Only a little  Social Connections: Unknown  . Frequency of Communication with Friends and Family: More than three times a week  . Frequency of Social Gatherings with Friends and Family: Three times a week  . Attends Religious Services: Not on file  . Active Member of Clubs or Organizations: Not on file  . Attends Archivist Meetings: Not on file  . Marital Status: Married  Human resources officer Violence:   . Fear of Current or Ex-Partner:   . Emotionally Abused:   Marland Kitchen Physically Abused:   . Sexually Abused:     Past Surgical History:  Procedure Laterality Date  . APPENDECTOMY    . ASD REPAIR, SECUNDUM  07/17/2008   pericardial patch closure of ASD  . AV NODE ABLATION  07/2008   for rapid atrial fib  . CARDIAC CATHETERIZATION    . CARDIAC DEFIBRILLATOR PLACEMENT  ~ [address] [facility]  . CARDIAC VALVE REPLACEMENT    . CATARACT EXTRACTION W/ INTRAOCULAR LENS  IMPLANT, BILATERAL Bilateral   . ESOPHAGOGASTRODUODENOSCOPY (EGD) WITH PROPOFOL N/A 09/29/2016   Procedure: ESOPHAGOGASTRODUODENOSCOPY (EGD) WITH PROPOFOL;   Surgeon: [name], MD;  Location: [facility] ENDOSCOPY;  Service: Endoscopy;  Laterality: N/A;  . HERNIA REPAIR    . IMPLANTABLE CARDIOVERTER DEFIBRILLATOR (ICD) GENERATOR CHANGE N/A 02/06/2012   Procedure: ICD GENERATOR CHANGE;  Surgeon: [name], MD;  Location: [facility] CATH LAB;  Service: Cardiovascular;  Laterality: N/A;  . INSERT / REPLACE / REMOVE PACEMAKER    . MASS BIOPSY Left    neck mass  . MITRAL VALVE REPLACEMENT Right 07/17/2008   41mm Medtronic Mosaic porcine bioprosthesis  . PENILE PROSTHESIS IMPLANT    . RIGHT HEART CATHETERIZATION N/A 08/16/2013   Procedure: RIGHT HEART CATH;  Surgeon: Josue Hector, MD;  Location: [facility] CATH LAB;  Service: Cardiovascular;  Laterality: N/A;  . TEE WITHOUT CARDIOVERSION N/A 09/06/2016   Procedure: TRANSESOPHAGEAL ECHOCARDIOGRAM (TEE);  Surgeon: Dorothy Spark, MD;  Location: [facility] ENDOSCOPY;  Service: Cardiovascular;  Laterality: N/A;  . THORACOTOMY Right 1970's   spontaneous pneumothorax - while in the [place]  . TOE SURGERY     left foot hammer toe  . TONSILLECTOMY    . VIDEO ASSISTED THORACOSCOPY (VATS)/WEDGE RESECTION Left 11/29/2013   Procedure: Video assisted thoracoscopy for wedge resection; mini thoracotomy;  Surgeon: [name], MD;  Location: [facility] OR;  Service: Thoracic;  Laterality: Left;    Family History  Problem Relation Age of Onset  . Stomach cancer Father   . Stroke Mother     Allergies  Allergen Reactions  . Anticoagulant Compound Other (See Comments)    Pt had intracranial bleed, therefore all anticoagulation is contraindicated per Dr. Ron Parker  . Other Other (See Comments)    Per Dr. Halford Chessman (Surgeon): stated that the patient cannot be put under for any surgery, as he has an enlarged aorta. He would stand only a 50/50 chance of surviving. He has lung issues, diminished lung tissue, COPD, and emphysema.  . Warfarin Sodium Other (See Comments)    Pt had intracranial bleed, therefore all anticoagulation is  contraindicated per Dr. Ron Parker    Current Outpatient Medications on File Prior to Visit  Medication Sig Dispense Refill  . acetaminophen (TYLENOL) 500 MG tablet Take 500-1,000 mg by mouth every 8 (eight) hours as needed for moderate pain or headache.     . albuterol (PROVENTIL HFA) 108 (90 Base) MCG/ACT inhaler Inhale 2 puffs into the lungs every 6 (six) hours as needed for wheezing or shortness of breath.    Marland Kitchen aspirin EC 81 MG tablet Take 1 tablet (81 mg total) by mouth daily. 90 tablet 3  . atorvastatin (LIPITOR) 10 MG tablet Take 10 mg by mouth daily.    . budesonide-formoterol (SYMBICORT) 160-4.5 MCG/ACT inhaler INHALE 2 PUFFS INTO THE LUNGS TWICE A DAY 10.2 Inhaler 6  . furosemide (LASIX) 40 MG tablet TAKE 1 TABLET BY MOUTH TWICE A DAY 180 tablet 3  . metFORMIN (GLUCOPHAGE) 1000 MG tablet TAKE 1 TABLET BY MOUTH TWICE A DAY WITH MEALS 180 tablet 0  . metoprolol succinate (TOPROL-XL) 25 MG 24 hr tablet Take 0.5 tablets (12.5 mg total) by mouth daily. 30 tablet 0  . nitroGLYCERIN (NITROSTAT) 0.4 MG SL tablet Place 0.4 mg under the tongue every 5 (five) minutes x 3 doses as needed for chest pain.     . OXYGEN Inhale 2-3 L/min into the lungs continuous.     . potassium chloride SA (KLOR-CON) 20 MEQ tablet Take 2 tablets (40 mEq total) by mouth daily. 180 tablet 1   No current facility-administered medications on file prior to visit.    BP (!) 110/54 (BP Location: Left Arm, Patient Position: Sitting, Cuff Size: Normal)   Pulse 62   Temp 97.8 F (36.6 C) (Temporal)   Wt 152 lb (68.9 kg)   SpO2 (!) 88%   BMI 20.05 kg/m       Objective:   Physical Exam Vitals and nursing note reviewed.  Constitutional:      Appearance: Normal appearance.  Cardiovascular:     Rate and Rhythm: Normal rate and regular rhythm.     Heart sounds: Murmur present.  Pulmonary:     Effort: Pulmonary effort is normal.     Breath sounds: Decreased air movement present.     Comments: 2 L via  [place] Musculoskeletal:        General: Normal range  of motion.  Skin:    General: Skin is warm and dry.  Neurological:     General: No focal deficit present.     Mental Status: He is alert and oriented to person, place, and time.  Psychiatric:        Mood and Affect: Mood normal.        Behavior: Behavior normal.        Thought Content: Thought content normal.        Judgment: Judgment normal.        Assessment & Plan:  1. Type 2 diabetes mellitus without complication, without long-term current use of insulin (HCC) A1c today is 6.5.  No changes in medication at this point.  Follow-up in 6 months or sooner if needed  [name], NP

## 2019-10-30 NOTE — Patient Instructions (Addendum)
It was great seeing you today   Your A1c is back down to 6.5 - we do not need to change any medications   I will see you back in 6 months or sooner if needed

## 2019-11-05 ENCOUNTER — Telehealth: Payer: Self-pay | Admitting: Pulmonary Disease

## 2019-11-05 NOTE — Telephone Encounter (Signed)
Needs appt  Spoke with spouse and scheduled this for 11/08/19

## 2019-11-07 ENCOUNTER — Encounter (HOSPITAL_COMMUNITY): Payer: Self-pay | Admitting: Emergency Medicine

## 2019-11-07 ENCOUNTER — Emergency Department (HOSPITAL_COMMUNITY): Payer: Medicare Other

## 2019-11-07 ENCOUNTER — Other Ambulatory Visit: Payer: Self-pay

## 2019-11-07 ENCOUNTER — Emergency Department (HOSPITAL_COMMUNITY)
Admission: EM | Admit: 2019-11-07 | Discharge: 2019-11-07 | Disposition: A | Discharge status: Home / Self Care | Payer: Medicare Other | Attending: Emergency Medicine | Admitting: Emergency Medicine

## 2019-11-07 DIAGNOSIS — Z79899 Other long term (current) drug therapy: Secondary | ICD-10-CM | POA: Diagnosis not present

## 2019-11-07 DIAGNOSIS — I110 Hypertensive heart disease with heart failure: Secondary | ICD-10-CM | POA: Insufficient documentation

## 2019-11-07 DIAGNOSIS — R001 Bradycardia, unspecified: Secondary | ICD-10-CM | POA: Diagnosis not present

## 2019-11-07 DIAGNOSIS — Z85118 Personal history of other malignant neoplasm of bronchus and lung: Secondary | ICD-10-CM | POA: Insufficient documentation

## 2019-11-07 DIAGNOSIS — J449 Chronic obstructive pulmonary disease, unspecified: Secondary | ICD-10-CM | POA: Insufficient documentation

## 2019-11-07 DIAGNOSIS — I4891 Unspecified atrial fibrillation: Secondary | ICD-10-CM | POA: Diagnosis not present

## 2019-11-07 DIAGNOSIS — Z7982 Long term (current) use of aspirin: Secondary | ICD-10-CM | POA: Insufficient documentation

## 2019-11-07 DIAGNOSIS — R079 Chest pain, unspecified: Secondary | ICD-10-CM

## 2019-11-07 DIAGNOSIS — D649 Anemia, unspecified: Secondary | ICD-10-CM | POA: Insufficient documentation

## 2019-11-07 DIAGNOSIS — I5042 Chronic combined systolic (congestive) and diastolic (congestive) heart failure: Secondary | ICD-10-CM | POA: Diagnosis not present

## 2019-11-07 DIAGNOSIS — R0789 Other chest pain: Secondary | ICD-10-CM | POA: Diagnosis not present

## 2019-11-07 DIAGNOSIS — Z95811 Presence of heart assist device: Secondary | ICD-10-CM | POA: Insufficient documentation

## 2019-11-07 DIAGNOSIS — Z7984 Long term (current) use of oral hypoglycemic drugs: Secondary | ICD-10-CM | POA: Diagnosis not present

## 2019-11-07 DIAGNOSIS — R0602 Shortness of breath: Secondary | ICD-10-CM | POA: Diagnosis not present

## 2019-11-07 DIAGNOSIS — E119 Type 2 diabetes mellitus without complications: Secondary | ICD-10-CM | POA: Diagnosis not present

## 2019-11-07 DIAGNOSIS — I959 Hypotension, unspecified: Secondary | ICD-10-CM | POA: Diagnosis not present

## 2019-11-07 DIAGNOSIS — R0902 Hypoxemia: Secondary | ICD-10-CM | POA: Diagnosis not present

## 2019-11-07 DIAGNOSIS — I77810 Thoracic aortic ectasia: Secondary | ICD-10-CM | POA: Diagnosis not present

## 2019-11-07 DIAGNOSIS — R000 Tachycardia, unspecified: Secondary | ICD-10-CM | POA: Diagnosis not present

## 2019-11-07 LAB — BASIC METABOLIC PANEL WITH GFR
Anion gap: 16 — ABNORMAL HIGH (ref 5–15)
BUN: 27 mg/dL — ABNORMAL HIGH (ref 8–23)
CO2: 19 mmol/L — ABNORMAL LOW (ref 22–32)
Calcium: 8.6 mg/dL — ABNORMAL LOW (ref 8.9–10.3)
Chloride: 104 mmol/L (ref 98–111)
Creatinine, Ser: 1.12 mg/dL (ref 0.61–1.24)
GFR calc Af Amer: >60 mL/min
GFR calc non Af Amer: >60 mL/min
Glucose, Bld: 209 mg/dL — ABNORMAL HIGH (ref 70–99)
Potassium: 4.8 mmol/L (ref 3.5–5.1)
Sodium: 139 mmol/L (ref 135–145)

## 2019-11-07 LAB — CBC
HCT: 31.9 % — ABNORMAL LOW (ref 39.0–52.0)
Hemoglobin: 8.7 g/dL — ABNORMAL LOW (ref 13.0–17.0)
MCH: 21 pg — ABNORMAL LOW (ref 26.0–34.0)
MCHC: 27.3 g/dL — ABNORMAL LOW (ref 30.0–36.0)
MCV: 76.9 fL — ABNORMAL LOW (ref 80.0–100.0)
Platelets: 268 10*3/uL (ref 150–400)
RBC: 4.15 MIL/uL — ABNORMAL LOW (ref 4.22–5.81)
RDW: 18.8 % — ABNORMAL HIGH (ref 11.5–15.5)
WBC: 8.4 10*3/uL (ref 4.0–10.5)
nRBC: 0.2 % (ref 0.0–0.2)

## 2019-11-07 LAB — TROPONIN I (HIGH SENSITIVITY)
Troponin I (High Sensitivity): 19 ng/L — ABNORMAL HIGH
Troponin I (High Sensitivity): 18 ng/L — ABNORMAL HIGH

## 2019-11-07 LAB — POC OCCULT BLOOD, ED: Fecal Occult Bld: NEGATIVE

## 2019-11-07 MED ORDER — SODIUM CHLORIDE 0.9% FLUSH: 3.0000 mL | Freq: Once | INTRAVENOUS | Status: DC

## 2019-11-07 MED ORDER — IOHEXOL 350 MG/ML SOLN
100.0000 mL | Freq: Once | INTRAVENOUS | Status: AC | PRN
  Administered 2019-11-07: 20:00:00 100 mL via INTRAVENOUS

## 2019-11-07 NOTE — ED Notes (Signed)
Pt ambulated in room with 4L of O2. Pulse ox remained between 90-95%.

## 2019-11-07 NOTE — ED Provider Notes (Signed)
[facility] EMERGENCY DEPARTMENT Provider Note   CSN: [id number] Arrival date & time: 11/07/19  1407     History Chief Complaint  Patient presents with   Shortness of Breath   AICD Problem   Chest Pain    [name] is a 74 y.o. male.  Very complex past medical history including heart failure with improved ejection fraction, pulmonary hypertension, COPD, A. Fib, mitral valve replacement bioprosthetic valve, formerly on anticoagulation but stopped due to brain bleed, lung cancer, large thoracic aortic aneurysm not candidate for surgery due to extensive medical history.  Patient presented to ER after having an episode of chest pain.  Reports earlier today felt a funny feeling across his chest, moderate pain, seem to radiate to his back.  Occurred at rest, resolved prior to arrival here.  No recent falls, denies any injuries to his back that he is aware of.  Chronically wears 4 L nasal cannula.  HPI     Past Medical History:  Diagnosis Date   Atrial septal defect    Closed with surgery January, 2010   Automatic implantable cardioverter-defibrillator in situ    LV dysfunction and pacer needed for AV node lesion   Chronic combined systolic and diastolic CHF (congestive heart failure) (HCC)    Colon polyps    COPD (chronic obstructive pulmonary disease) (HCC)    O2- 2 liters, nasal cannula, q night    CVA (cerebral vascular accident) ([place]) 2009   denies residual on 08/14/2013   Diabetes mellitus without complication ([place])    type 2   Dyslipidemia    Endocarditis    Bacterial, 2009   Hypertension    Intracranial hemorrhage (HCC)    Coumadin cannot be used because of the history of his bleed   Lung cancer ([place]) 11/29/2013   T1N0 Stage Ia non-small cell carcinoma left lung treated with wedge resection   Myocardial infarction [facility]) 2010   Pacemaker    combo pacer and icd   Permanent atrial fibrillation    Originally Coumadin use for atrial  fibrillation  //   he had intracerebral hemorrhage with an INR of 2.3 June, 2009. Anticoagulation could no longer be used.  //  Rapid atrial fibrillation after inferior MI October, 2010..........Marland Kitchen AV node ablation done at that time with ICD pacemaker placed (EF 35%).   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   Atherosclerosis of aorta (HCC) 08/11/2016   Orthostatic hypotension 09/26/2015   Near syncope 09/24/2015   Syncope 09/24/2015   Hepatic cirrhosis ([place]) 09/25/2014   History of colonic polyps 09/25/2014   Serum total bilirubin elevated 06/30/2014   Follow-up examination, following unspecified surgery 01/27/2014   Lung cancer ([place]) 11/29/2013   Prosthetic valve dysfunction    Chronic combined systolic and diastolic CHF  (congestive heart failure) ([place])    Diabetes ([place]) 11/04/2013   Myocardial infarction ([place]) 11/01/2013   Cardiomyopathy, nonischemic ([place]) 11/01/2013   Acute on chronic respiratory failure with hypoxia ([place]) 08/15/2013   H/O intracranial hemorrhage 08/13/2013   H/O endocarditis 08/13/2013   H/O: CVA (cerebrovascular accident) 08/13/2013   Community acquired pneumonia 08/13/2013   Chronic respiratory failure assoc with chf/ PAH 08/12/2013   H/O atrioventricular nodal ablation    S/P MVR (mitral valve replacement)    Hypertension    Dyslipidemia    Permanent atrial fibrillation    Ejection fraction < 50%    ICD (implantable cardioverter-defibrillator), biventricular, in situ    Renal artery stenosis ([place])    Pulmonary hypertension ([place])    Primary cancer of left upper lobe of lung ([place]) 04/20/2009   COLONIC POLYPS, ADENOMATOUS 03/22/2007   COPD GOLD II 01/11/2007   GERD 01/11/2007    Past Surgical History:  Procedure Laterality Date   APPENDECTOMY     ASD REPAIR, SECUNDUM  07/17/2008   pericardial patch closure of ASD   AV NODE ABLATION  07/2008   for rapid atrial fib   CARDIAC CATHETERIZATION     CARDIAC DEFIBRILLATOR PLACEMENT  ~ 2010   [employer]   CARDIAC VALVE REPLACEMENT     CATARACT EXTRACTION W/ INTRAOCULAR LENS  IMPLANT, BILATERAL Bilateral    ESOPHAGOGASTRODUODENOSCOPY (EGD) WITH PROPOFOL N/A 09/29/2016   Procedure: ESOPHAGOGASTRODUODENOSCOPY (EGD) WITH PROPOFOL;  Surgeon: [name], MD;  Location: [facility] ENDOSCOPY;  Service: Endoscopy;  Laterality: N/A;   HERNIA REPAIR     IMPLANTABLE CARDIOVERTER DEFIBRILLATOR (ICD) GENERATOR CHANGE N/A 02/06/2012   Procedure: ICD GENERATOR CHANGE;  Surgeon: [name], MD;  Location: [facility] CATH LAB;  Service: Cardiovascular;  Laterality: N/A;   INSERT / REPLACE / REMOVE PACEMAKER     MASS BIOPSY Left    neck mass   MITRAL VALVE REPLACEMENT Right 07/17/2008   65mm Medtronic Mosaic porcine  bioprosthesis   PENILE PROSTHESIS IMPLANT     RIGHT HEART CATHETERIZATION N/A 08/16/2013   Procedure: RIGHT HEART CATH;  Surgeon: Josue Hector, MD;  Location: [facility] CATH LAB;  Service: Cardiovascular;  Laterality: N/A;   TEE WITHOUT CARDIOVERSION N/A 09/06/2016   Procedure: TRANSESOPHAGEAL ECHOCARDIOGRAM (TEE);  Surgeon: Dorothy Spark, MD;  Location: [facility] ENDOSCOPY;  Service: Cardiovascular;  Laterality: N/A;   THORACOTOMY Right 1970's   spontaneous pneumothorax - while in the [place]     left foot hammer toe   TONSILLECTOMY     VIDEO ASSISTED THORACOSCOPY (VATS)/WEDGE RESECTION Left 11/29/2013   Procedure: Video assisted thoracoscopy for wedge resection; mini thoracotomy;  Surgeon: [name], MD;  Location: [facility];  Service: Thoracic;  Laterality: Left;       Family History  Problem Relation Age of Onset   Stomach cancer Father    Stroke Mother     Social History   Tobacco Use  Smoking status: Former Smoker    Packs/day: 1.00    Years: 45.00    Pack years: 45.00    Types: Cigarettes    Quit date: 2011    Years since quitting: 10.3   Smokeless tobacco: Never Used  Substance Use Topics   Alcohol use: No    Alcohol/week: 0.0 standard drinks    Comment: 08/14/2013 "used to drink beer; quit:in 1982"   Drug use: No    Home Medications Prior to Admission medications   Medication Sig Start Date End Date Taking? Authorizing Provider  aspirin EC 81 MG tablet Take 1 tablet (81 mg total) by mouth daily. 12/07/12  Yes Carlena Bjornstad, MD  atorvastatin (LIPITOR) 10 MG tablet Take 10 mg by mouth daily.   Yes [provider]  budesonide-formoterol (SYMBICORT) 160-4.5 MCG/ACT inhaler INHALE 2 PUFFS INTO THE LUNGS TWICE A DAY Patient taking differently: Inhale 2 puffs into the lungs in the morning and at bedtime.  05/22/19  Yes Lauraine Rinne, NP  furosemide (LASIX) 40 MG tablet TAKE 1 TABLET BY MOUTH TWICE A DAY Patient taking differently: Take 40  mg by mouth 2 (two) times daily.  09/04/19  Yes Nafziger, Tommi Rumps, NP  metFORMIN (GLUCOPHAGE) 1000 MG tablet TAKE 1 TABLET BY MOUTH TWICE A DAY WITH MEALS Patient taking differently: Take 1,000 mg by mouth 2 (two) times daily with a meal.  09/13/19  Yes Nafziger, Tommi Rumps, NP  metoprolol succinate (TOPROL-XL) 25 MG 24 hr tablet Take 0.5 tablets (12.5 mg total) by mouth daily. 06/11/19  Yes Regalado, Belkys A, MD  nitroGLYCERIN (NITROSTAT) 0.4 MG SL tablet Place 0.4 mg under the tongue every 5 (five) minutes x 3 doses as needed for chest pain.    Yes [provider]  OXYGEN Inhale 2-3 L/min into the lungs continuous.    Yes [provider]  potassium chloride SA (KLOR-CON) 20 MEQ tablet Take 2 tablets (40 mEq total) by mouth daily. 08/12/19 11/10/19 Yes Nafziger, Tommi Rumps, NP  Budeson-Glycopyrrol-Formoterol (BREZTRI AEROSPHERE) 160-9-4.8 MCG/ACT AERO Inhale 2 puffs into the lungs in the morning and at bedtime. 11/08/19   Martyn Ehrich, NP    Allergies    Anticoagulant compound, Other, and Warfarin sodium  Review of Systems   Review of Systems  Constitutional: Negative for chills and fever.  HENT: Negative for ear pain and sore throat.   Eyes: Negative for pain and visual disturbance.  Respiratory: Negative for cough and shortness of breath.   Cardiovascular: Positive for chest pain. Negative for palpitations.  Gastrointestinal: Negative for abdominal pain and vomiting.  Genitourinary: Negative for dysuria and hematuria.  Musculoskeletal: Positive for back pain. Negative for arthralgias.  Skin: Negative for color change and rash.  Neurological: Negative for seizures and syncope.  All other systems reviewed and are negative.   Physical Exam Updated Vital Signs BP (!) 115/59    Pulse 77    Temp 97.9 F (36.6 C)    Resp 20    SpO2 90%   Physical Exam Vitals and nursing note reviewed.  Constitutional:      Comments: Chronically ill appearing but no distress  HENT:     Head:  Normocephalic and atraumatic.  Eyes:     Conjunctiva/sclera: Conjunctivae normal.  Cardiovascular:     Rate and Rhythm: Normal rate and regular rhythm.     Heart sounds: No murmur.  Pulmonary:     Effort: Pulmonary effort is normal. No respiratory distress.     Breath sounds: Normal  breath sounds.  Abdominal:     Palpations: Abdomen is soft.     Tenderness: There is no abdominal tenderness.  Genitourinary:    Rectum: Guaiac result negative.     Comments: Brown stool, no hemorrhoid or fissure Musculoskeletal:     Cervical back: Neck supple.     Right lower leg: No edema.     Left lower leg: No edema.  Skin:    General: Skin is warm and dry.     ED Results / Procedures / Treatments   Labs (all labs ordered are listed, but only abnormal results are displayed) Labs Reviewed  BASIC METABOLIC PANEL - Abnormal; Notable for the following components:      Result Value   CO2 19 (*)    Glucose, Bld 209 (*)    BUN 27 (*)    Calcium 8.6 (*)    Anion gap 16 (*)    All other components within normal limits  CBC - Abnormal; Notable for the following components:   RBC 4.15 (*)    Hemoglobin 8.7 (*)    HCT 31.9 (*)    MCV 76.9 (*)    MCH 21.0 (*)    MCHC 27.3 (*)    RDW 18.8 (*)    All other components within normal limits  TROPONIN I (HIGH SENSITIVITY) - Abnormal; Notable for the following components:   Troponin I (High Sensitivity) 19 (*)    All other components within normal limits  TROPONIN I (HIGH SENSITIVITY) - Abnormal; Notable for the following components:   Troponin I (High Sensitivity) 18 (*)    All other components within normal limits  POC OCCULT BLOOD, ED    EKG EKG Interpretation  Date/Time:  Thursday November 07 2019 14:25:20 EDT Ventricular Rate:  70 PR Interval:    QRS Duration: 140 QT Interval:  448 QTC Calculation: 483 R Axis:   -107 Text Interpretation: Ventricular-paced rhythm Biventricular pacemaker detected Abnormal ECG Confirmed by Madalyn Rob  [id number]) on 11/07/2019 6:41:15 PM   Radiology DG Chest 2 View  Result Date: 11/07/2019 CLINICAL DATA:  Shortness of breath. Central chest pain started around noon today. History of atrial septal defect. EXAM: CHEST - 2 VIEW COMPARISON:  06/07/2019 FINDINGS: Heart is enlarged and stable in configuration. Patient has a RIGHT-sided pacemaker with leads to the RIGHT ventricle and coronary sinus. There are apical bullous changes, LEFT greater than RIGHT. Chronic lung opacity identified at the RIGHT lung base. There is stable elevation of LEFT hemidiaphragm. IMPRESSION: Stable cardiomegaly. Stable emphysematous changes and pulmonary opacities. Electronically Signed   By: Nolon Nations M.D.   On: 11/07/2019 15:00   CT Angio Chest/Abd/Pel for Dissection W and/or Wo Contrast  Result Date: 11/07/2019 CLINICAL DATA:  Chest pain or back pain, aortic dissection suspected chest pain, back pain, known thoracic aortic root dilation, aneurysm, concern for acute aortic dissection or other acute aortic pathology EXAM: CT ANGIOGRAPHY CHEST, ABDOMEN AND PELVIS TECHNIQUE: Non-contrast CT of the chest was initially obtained. Multidetector CT imaging through the chest, abdomen and pelvis was performed using the standard protocol during bolus administration of intravenous contrast. Multiplanar reconstructed images and MIPs were obtained and reviewed to evaluate the vascular anatomy. CONTRAST:  136mL OMNIPAQUE IOHEXOL 350 MG/ML SOLN COMPARISON:  Radiograph earlier this day. Chest CT 07/30/2019, additional priors. 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 This measures approximately 5.1 cm when measured on coronal reformats series 8, image 43. No evidence of aneurysm rupture,  stranding, or inflammation. Remainder of the thoracic aorta is normal in caliber. There is no ascending, transverse, or descending aortic aneurysm. Mild atherosclerosis. No aortic dissection, acute  aortic syndrome, or aortic hematoma on noncontrast exam. Scattered coronary artery calcifications. Cardiomegaly with marked left atrial dilatation. Borders of the left atrial appendage are diffusely lobulated and thickened, potentially left atrial thrombus measuring 12 mm. There is a prosthetic mitral valve. Right-sided pacemaker in place. No pericardial effusion. Limited assessment for pulmonary embolism, no filling defects within the central pulmonary arteries. Marked pulmonary artery enlargement involving the right greater than left main pulmonary artery. Mediastinum/Nodes: Mediastinal adenopathy with index nodes as follows, correlating with prior exam. AP window node measures 1.5 cm, series 5, image 42, unchanged. Azagoesophageal recess node measures 1.7 cm, series 5, image 60, unchanged. Left supraclavicular node measures 1.4 cm, series 5, image 11, previously 1.2 cm. No definite new or progressive adenopathy. Decompressed esophagus. No visualized thyroid nodule. Lungs/Pleura: Advanced emphysema. Lobulated right pleural thickening/fluid has slightly progressed from prior exam. There is new fluid or thickening involving the right major fissure. Chain sutures noted in the left upper lung. Ground-glass and interstitial attenuation involving the dependent lower lobes with seen on prior exam, slightly progressed. There is central bronchial thickening with areas of mucous plugging in the lower lobes. Calcified granuloma in the right lung. Musculoskeletal: Bones are diffusely under mineralized. No focal bone lesion. Remote right rib fractures, postsurgical change involving right ribs. Review of the MIP images confirms the above findings. CTA ABDOMEN AND PELVIS FINDINGS VASCULAR Aorta: Moderate atherosclerosis without aneurysm, dissection, vasculitis or acute aortic finding. Celiac: Patent without evidence of aneurysm, dissection, vasculitis or significant stenosis. SMA: Patent without evidence of aneurysm, dissection,  vasculitis or significant stenosis. There is a replaced hepatic artery arises from the SMA. Renals: Both renal arteries are patent without evidence of aneurysm, dissection, vasculitis, fibromuscular dysplasia or significant stenosis. There are accessory bilateral renal arteries to the lower poles which are also patent. IMA: Patent without evidence of aneurysm, dissection, vasculitis or significant stenosis. Inflow: Advanced calcified plaque without aneurysm, dissection, severe stenosis or vasculitis. Veins: No obvious venous abnormality within the limitations of this arterial phase study. Review of the MIP images confirms the above findings. NON-VASCULAR Hepatobiliary: Evaluation for focal lesion is limited on arterial phase imaging. There are nodular hepatic contours. No evidence of focal lesion. Partially distended gallbladder without calcified gallstone. Pancreas: No ductal dilatation or inflammation. Spleen: Normal in size and arterial enhancement. Adrenals/Urinary Tract: No adrenal nodule. No hydronephrosis. No perinephric edema. Bilateral renal cortical cysts of varying sizes. Mild bladder distention without wall thickening. Stomach/Bowel: Bowel evaluation is limited in the absence of enteric contrast, arterial phase imaging, and paucity of intra-abdominal fat. Stomach is decompressed. No bowel dilatation or obstruction. No bowel inflammation. There are areas of mesenteric swirling but no evidence of ischemia or vascular compromise. The appendix is not definitively visualized. Lymphatic: No abdominopelvic adenopathy. Reproductive: Enlarged prostate gland causes mass effect on the bladder base. Penile prosthesis. Other: No ascites or free air. Musculoskeletal: Mild T12 and L1 superior endplate compression fractures. Mild L4 superior endplate compression fracture. Multilevel degenerative change in the lumbar spine with prominent degenerative disc disease and facet hypertrophy. Grade 1 anterolisthesis of L4 on L5  and retrolisthesis of L3 on L4 likely degenerative and facet mediated. No evidence of focal bone lesion. Bones are diffusely under mineralized. Review of the MIP images confirms the above findings. IMPRESSION: 1. No aortic dissection, acute aortic syndrome, or acute aortic  abnormality. 2. 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 3. Cardiomegaly with marked left atrial dilatation. Borders of the left atrial appendage are diffusely lobulated and thickened, potentially left atrial appendage thrombus measuring 12 mm. Recommend correlation with echocardiogram. 4. Advanced emphysema. 5. Small right pleural effusion which is partially lobulated, now with fluid in the major fissure. Trace left pleural effusion. 6. Mediastinal adenopathy stable from prior exam. Left supraclavicular node has slightly increased in size. Recommend attention at follow-up. 7. Enlarged prostate gland causes mass effect on the bladder base. 8. Mild T12 and L1 superior endplate compression fractures. Mild L4 superior endplate compression fracture. These fractures are age indeterminate. Aortic Atherosclerosis (ICD10-I70.0) and Emphysema (ICD10-J43.9). Electronically Signed   By: Keith Rake M.D.   On: 11/07/2019 20:56    Procedures Procedures (including critical care time)  Medications Ordered in ED Medications  iohexol (OMNIPAQUE) 350 MG/ML injection 100 mL (100 mLs Intravenous Contrast Given 11/07/19 2012)    ED Course  I have reviewed the triage vital signs and the nursing notes.  Pertinent labs & imaging results that were available during my care of the patient were reviewed by me and considered in my medical decision making (see chart for details).    MDM Rules/Calculators/A&P                      74 year old male with extensive past medical history, presented to ER after an episode of chest pain and back pain.  Here no ongoing symptoms. EKG without ischemic change, tropx2 grossly wnl and no delta. Doubt ACS. Hgb  slightly lower than baseline anemia at 8.7. Pt denied any bleeding issues and had heme negative stool. Will defer further work up to his primary.  Has history of significant dilated aortic root, Sinus of Valsalva aneurysm and was deemed not a surgical candidate for repair.  Given description of symptoms, obtain CTA to rule out rupture or other acute aortic pathology.  The study was negative for acute vascular issue. Radiologist did comment on a couple mild endplate compression fractures. Patient does not have any point tendernss over this region, no deformity and denies any recent falls, suspect this is a chronic finding. Noted radiologist additional findings of potentially left atrial appendage thrombus measuring 12 mm as well as slightly enlarged lymph nodes.Previously deemed not a candidate for anticoagulation, has no stroke symptoms today, will defer any further decisions/work up to his primary doctors - PCP, cardiolgy and pulmonology. Pt has f/u appt with pulmnology tomorrow. Patient ambulated well without issue in room. Vital signs remain stable and pt remains asmyptomatic. Given work up today, believe patient can be discharged and managed in the out pt setting. I stressed need for close f/u with his primary doctors and need to return for any worsening symptoms.     After the discussed management above, the patient was determined to be safe for discharge.  The patient was in agreement with this plan and all questions regarding their care were answered.  ED return precautions were discussed and the patient will return to the ED with any significant worsening of condition.   Final Clinical Impression(s) / ED Diagnoses Final diagnoses:  Chest pain, unspecified type  Anemia, unspecified type    Rx / DC Orders ED Discharge Orders    None       Lucrezia Starch, MD 11/08/19 1401

## 2019-11-07 NOTE — ED Triage Notes (Signed)
Per EMS- pt here from home,  has lung cancer COPD, wears 4L O2 at baseline. Has not been maintaining O2 sats. On 6L w EMS. Pt also reports pacemaker causing funny feeling, states chest pain. EKg w EMS showed paced rhythm. 18 G PIv to LAC

## 2019-11-07 NOTE — ED Notes (Signed)
Pt transported to CT ?

## 2019-11-07 NOTE — Discharge Instructions (Signed)
Recommend scheduling follow-up both with your primary doctor as well as your cardiology team.  If you feel that your breathing worsens, you have recurrent chest pain or other new concerning symptom, please return to the emergency room for reassessment.  Your hemoglobin levels were noted to be somewhat lower than normal.  Please discuss this finding with your primary doctor and asked that your blood counts be rechecked within the next week.

## 2019-11-08 ENCOUNTER — Ambulatory Visit (INDEPENDENT_AMBULATORY_CARE_PROVIDER_SITE_OTHER): Payer: Medicare Other | Admitting: Primary Care

## 2019-11-08 ENCOUNTER — Encounter: Payer: Self-pay | Admitting: Primary Care

## 2019-11-08 ENCOUNTER — Telehealth: Payer: Self-pay | Admitting: Primary Care

## 2019-11-08 DIAGNOSIS — J449 Chronic obstructive pulmonary disease, unspecified: Secondary | ICD-10-CM | POA: Diagnosis not present

## 2019-11-08 MED ORDER — BREZTRI AEROSPHERE 160-9-4.8 MCG/ACT IN AERO: 2.0000 | INHALATION_SPRAY | Freq: Two times a day (BID) | RESPIRATORY_TRACT | 0 refills | Status: AC

## 2019-11-08 NOTE — Telephone Encounter (Signed)
Patient has [employer] insurance:  Breztri- $87.90 for 1 month supply after electronic copay card picked up $200 of copay  Trelegy is preferred- $30.00 copay for 1 month supply. There is a copay card available for Trelegy to make copay zero.

## 2019-11-08 NOTE — Telephone Encounter (Signed)
We don't have any physical copay cards in the office. I went online and entered patient's information and received a response that patient was ineligible despite not having Medicare prescription plan. Patients 69 or older are deemed Medicare prescription plan eligible and are unable to register for [place] copay cards.

## 2019-11-08 NOTE — Patient Instructions (Addendum)
Orders: Renew oxygen _3-4____ L  on exertoin  COPD: STOP Symbicort START Breztri- take two puffs in morning and evening   Follow-up: 2 weeks with Dr. Elsworth Soho or APP

## 2019-11-08 NOTE — Telephone Encounter (Signed)
That is wonderful, thank you. Can I get that card from you? He has a follow-up in two weeks so I will change him over to Trelegy then

## 2019-11-08 NOTE — Telephone Encounter (Signed)
Patient is on Symbicort. I gave him a sample of Breztri. Can you do a benefit investigation for Breztri.

## 2019-11-08 NOTE — Telephone Encounter (Signed)
Sending staff message instead

## 2019-11-08 NOTE — Progress Notes (Signed)
@Patient  ID: [name], male    DOB: 09/03/1945, 74 y.o.   MRN: [id number]  Chief Complaint  Patient presents with  . Follow-up    request POC    Referring provider: [name], NP  HPI: 74 year old male, former smoker. PMH significant for COPD GOLD II, lung cancer, pulmonary edema. Patient of Dr. Elsworth Soho, last seen by pulmonary NP October 2020.   11/08/2019 Patient presents today to re-qualify for oxygen and he is interested in a POC. Breathing has been fine. He does have to stop to catch his breath, states that when he gets tired he will find a place to sit down. Reports that it doesn't take him long to recover. He has been using his symbicort twice daily as prescribed but feels he could use something better. CTA showed advanced emphysema. Last PFTs in 2018 showed moderate obstruction.    Allergies  Allergen Reactions  . Anticoagulant Compound Other (See Comments)    Pt had intracranial bleed, therefore all anticoagulation is contraindicated per Dr. Ron Parker  . Other Other (See Comments)    Per Dr. Halford Chessman (Surgeon): stated that the patient cannot be put under for any surgery, as he has an enlarged aorta. He would stand only a 50/50 chance of surviving. He has lung issues, diminished lung tissue, COPD, and emphysema.  . Warfarin Sodium Other (See Comments)    Pt had intracranial bleed, therefore all anticoagulation is contraindicated per Dr. Ron Parker    Immunization History  Administered Date(s) Administered  . Fluad Quad(high Dose 65+) 04/12/2019  . Hep A / Hep B 09/25/2014, 10/02/2014, 10/24/2014, 10/02/2015  . Influenza Split 03/31/2011, 04/02/2012  . Influenza Whole 07/11/2000, 04/01/2009, 04/12/2010  . Influenza, High Dose Seasonal PF 05/01/2015, 04/26/2017, 04/06/2018  . Influenza,inj,Quad PF,6+ Mos 04/04/2013, 04/18/2014  . Influenza-Unspecified 03/23/2016  . Pneumococcal Conjugate-13 08/05/2013  . Pneumococcal Polysaccharide-23 07/11/2000, 04/01/2009, 04/04/2013    . Td 07/11/2000  . Tdap 08/03/2011    Past Medical History:  Diagnosis Date  . Atrial septal defect    Closed with surgery January, 2010  . Automatic implantable cardioverter-defibrillator in situ    LV dysfunction and pacer needed for AV node lesion  . Chronic combined systolic and diastolic CHF (congestive heart failure) ([place])   . Colon polyps   . COPD (chronic obstructive pulmonary disease) (HCC)    O2- 2 liters, nasal cannula, q night   . CVA (cerebral vascular accident) ([place]) 2009   denies residual on 08/14/2013  . Diabetes mellitus without complication ([place])    type 2  . Dyslipidemia   . Endocarditis    Bacterial, 2009  . Hypertension   . Intracranial hemorrhage (HCC)    Coumadin cannot be used because of the history of his bleed  . Lung cancer ([place]) 11/29/2013   T1N0 Stage Ia non-small cell carcinoma left lung treated with wedge resection  . Myocardial infarction ([place]) 2010  . Pacemaker    combo pacer and icd  . Permanent atrial fibrillation    Originally Coumadin use for atrial fibrillation  //   he had intracerebral hemorrhage with an INR of 2.3 June, 2009. Anticoagulation could no longer be used.  //  Rapid atrial fibrillation after inferior MI October, 2010..........Marland Kitchen AV node ablation done at that time with ICD pacemaker placed (EF 35%).   //   Left atrial appendage tied off at the time of mitral valve surgery January, 2010 (maze pro  . Pneumonia 07/2018  . Pulmonary hypertension ([place])  Moderate  . Renal artery stenosis (HCC)    Mild by history  . 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 Spontaneous pneumothorax    right thoracotomy - distant past  . Status post minimally invasive mitral valve replacement with bioprosthetic valve    33 mm Medtronic Mosaic porcine bioprosthesis placed via right mini thoracotomy for bacterial endocarditis complicated by severe MR and CHF   . Thoracic aortic aneurysm ([place]) 08/11/2016   a - Chest CTA 1/18:  Aneurysmal dilatation of  aortic root is noted at 5.1 cm.     Tobacco History: Social History   Tobacco Use  Smoking Status Former Smoker  . Packs/day: 1.00  . Years: 45.00  . Pack years: 45.00  . Types: Cigarettes  . Quit date: 2011  . Years since quitting: 10.3  Smokeless Tobacco Never Used   Counseling given: Not Answered   Outpatient Medications Prior to Visit  Medication Sig Dispense Refill  . aspirin EC 81 MG tablet Take 1 tablet (81 mg total) by mouth daily. 90 tablet 3  . atorvastatin (LIPITOR) 10 MG tablet Take 10 mg by mouth daily.    . budesonide-formoterol (SYMBICORT) 160-4.5 MCG/ACT inhaler INHALE 2 PUFFS INTO THE LUNGS TWICE A DAY (Patient taking differently: Inhale 2 puffs into the lungs in the morning and at bedtime. ) 10.2 Inhaler 6  . furosemide (LASIX) 40 MG tablet TAKE 1 TABLET BY MOUTH TWICE A DAY (Patient taking differently: Take 40 mg by mouth 2 (two) times daily. ) 180 tablet 3  . metFORMIN (GLUCOPHAGE) 1000 MG tablet TAKE 1 TABLET BY MOUTH TWICE A DAY WITH MEALS (Patient taking differently: Take 1,000 mg by mouth 2 (two) times daily with a meal. ) 180 tablet 0  . metoprolol succinate (TOPROL-XL) 25 MG 24 hr tablet Take 0.5 tablets (12.5 mg total) by mouth daily. 30 tablet 0  . nitroGLYCERIN (NITROSTAT) 0.4 MG SL tablet Place 0.4 mg under the tongue every 5 (five) minutes x 3 doses as needed for chest pain.     . OXYGEN Inhale 2-3 L/min into the lungs continuous.     . potassium chloride SA (KLOR-CON) 20 MEQ tablet Take 2 tablets (40 mEq total) by mouth daily. 180 tablet 1   No facility-administered medications prior to visit.   Review of Systems  Review of Systems  Constitutional: Negative.   Respiratory: Positive for shortness of breath. Negative for cough, chest tightness and wheezing.   Cardiovascular: Negative for chest pain, palpitations and leg swelling.   Physical Exam  BP (!) 120/58 (BP Location: Left Arm, Cuff Size: Normal)   Pulse 71   Temp 97.9 F (36.6 C)  (Temporal)   Ht 6\' 1"  (1.854 m)   Wt 158 lb 6.4 oz (71.8 kg)   SpO2 90%   BMI 20.90 kg/m  Physical Exam Constitutional:      Comments: Thin  Pulmonary:     Effort: Pulmonary effort is normal.     Breath sounds: Rales present.  Neurological:     General: No focal deficit present.  Psychiatric:        Mood and Affect: Mood normal.        Behavior: Behavior normal.        Judgment: Judgment normal.      Lab Results:  CBC    Component Value Date/Time   WBC 8.4 11/07/2019 1450   RBC 4.15 (L) 11/07/2019 1450   HGB 8.7 (L) 11/07/2019 1450   HGB 11.0 (L) 07/30/2019 1003  HGB 15.6 12/20/2016 0746   HCT 31.9 (L) 11/07/2019 1450   HCT 49.3 12/20/2016 0746   PLT 268 11/07/2019 1450   PLT 314 07/30/2019 1003   PLT 151 12/20/2016 0746   MCV 76.9 (L) 11/07/2019 1450   MCV 96.5 12/20/2016 0746   MCH 21.0 (L) 11/07/2019 1450   MCHC 27.3 (L) 11/07/2019 1450   RDW 18.8 (H) 11/07/2019 1450   RDW 15.2 (H) 12/20/2016 0746   LYMPHSABS 0.5 (L) 07/30/2019 1003   LYMPHSABS 0.8 (L) 12/20/2016 0746   MONOABS 1.0 07/30/2019 1003   MONOABS 0.7 12/20/2016 0746   EOSABS 0.2 07/30/2019 1003   EOSABS 0.2 12/20/2016 0746   BASOSABS 0.1 07/30/2019 1003   BASOSABS 0.0 12/20/2016 0746    BMET    Component Value Date/Time   NA 139 11/07/2019 1450   NA 140 12/20/2016 0746   K 4.8 11/07/2019 1450   K 5.1 12/20/2016 0746   CL 104 11/07/2019 1450   CO2 19 (L) 11/07/2019 1450   CO2 29 12/20/2016 0746   GLUCOSE 209 (H) 11/07/2019 1450   GLUCOSE 166 (H) 12/20/2016 0746   GLUCOSE 100 (H) 07/19/2006 1101   BUN 27 (H) 11/07/2019 1450   BUN 20.6 12/20/2016 0746   CREATININE 1.12 11/07/2019 1450   CREATININE 1.15 07/30/2019 1003   CREATININE 1.2 12/20/2016 0746   CALCIUM 8.6 (L) 11/07/2019 1450   CALCIUM 10.0 12/20/2016 0746   GFRNONAA >60 11/07/2019 1450   GFRNONAA >60 07/30/2019 1003   GFRAA >60 11/07/2019 1450   GFRAA >60 07/30/2019 1003    BNP    Component Value Date/Time   BNP  1,011.1 (H) 06/07/2019 1007    ProBNP    Component Value Date/Time   PROBNP 1,738.0 (H) 08/15/2013 1405    Imaging: DG Chest 2 View  Result Date: 11/07/2019 CLINICAL DATA:  Shortness of breath. Central chest pain started around noon today. History of atrial septal defect. EXAM: CHEST - 2 VIEW COMPARISON:  06/07/2019 FINDINGS: Heart is enlarged and stable in configuration. Patient has a RIGHT-sided pacemaker with leads to the RIGHT ventricle and coronary sinus. There are apical bullous changes, LEFT greater than RIGHT. Chronic lung opacity identified at the RIGHT lung base. There is stable elevation of LEFT hemidiaphragm. IMPRESSION: Stable cardiomegaly. Stable emphysematous changes and pulmonary opacities. Electronically Signed   By: Nolon Nations M.D.   On: 11/07/2019 15:00   CT Angio Chest/Abd/Pel for Dissection W and/or Wo Contrast  Result Date: 11/07/2019 CLINICAL DATA:  Chest pain or back pain, aortic dissection suspected chest pain, back pain, known thoracic aortic root dilation, aneurysm, concern for acute aortic dissection or other acute aortic pathology EXAM: CT ANGIOGRAPHY CHEST, ABDOMEN AND PELVIS TECHNIQUE: Non-contrast CT of the chest was initially obtained. Multidetector CT imaging through the chest, abdomen and pelvis was performed using the standard protocol during bolus administration of intravenous contrast. Multiplanar reconstructed images and MIPs were obtained and reviewed to evaluate the vascular anatomy. CONTRAST:  113mL OMNIPAQUE IOHEXOL 350 MG/ML SOLN COMPARISON:  Radiograph earlier this day. Chest CT 07/30/2019, additional priors. 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 This measures approximately 5.1 cm when measured on coronal reformats series 8, image 43. No evidence of aneurysm rupture, stranding, or inflammation. Remainder of the thoracic aorta is normal in caliber. There is no ascending, transverse, or  descending aortic aneurysm. Mild atherosclerosis. No aortic dissection, acute aortic syndrome, or aortic hematoma on noncontrast exam. Scattered coronary artery calcifications.  Cardiomegaly with marked left atrial dilatation. Borders of the left atrial appendage are diffusely lobulated and thickened, potentially left atrial thrombus measuring 12 mm. There is a prosthetic mitral valve. Right-sided pacemaker in place. No pericardial effusion. Limited assessment for pulmonary embolism, no filling defects within the central pulmonary arteries. Marked pulmonary artery enlargement involving the right greater than left main pulmonary artery. Mediastinum/Nodes: Mediastinal adenopathy with index nodes as follows, correlating with prior exam. AP window node measures 1.5 cm, series 5, image 42, unchanged. Azagoesophageal recess node measures 1.7 cm, series 5, image 60, unchanged. Left supraclavicular node measures 1.4 cm, series 5, image 11, previously 1.2 cm. No definite new or progressive adenopathy. Decompressed esophagus. No visualized thyroid nodule. Lungs/Pleura: Advanced emphysema. Lobulated right pleural thickening/fluid has slightly progressed from prior exam. There is new fluid or thickening involving the right major fissure. Chain sutures noted in the left upper lung. Ground-glass and interstitial attenuation involving the dependent lower lobes with seen on prior exam, slightly progressed. There is central bronchial thickening with areas of mucous plugging in the lower lobes. Calcified granuloma in the right lung. Musculoskeletal: Bones are diffusely under mineralized. No focal bone lesion. Remote right rib fractures, postsurgical change involving right ribs. Review of the MIP images confirms the above findings. CTA ABDOMEN AND PELVIS FINDINGS VASCULAR Aorta: Moderate atherosclerosis without aneurysm, dissection, vasculitis or acute aortic finding. Celiac: Patent without evidence of aneurysm, dissection, vasculitis  or significant stenosis. SMA: Patent without evidence of aneurysm, dissection, vasculitis or significant stenosis. There is a replaced hepatic artery arises from the SMA. Renals: Both renal arteries are patent without evidence of aneurysm, dissection, vasculitis, fibromuscular dysplasia or significant stenosis. There are accessory bilateral renal arteries to the lower poles which are also patent. IMA: Patent without evidence of aneurysm, dissection, vasculitis or significant stenosis. Inflow: Advanced calcified plaque without aneurysm, dissection, severe stenosis or vasculitis. Veins: No obvious venous abnormality within the limitations of this arterial phase study. Review of the MIP images confirms the above findings. NON-VASCULAR Hepatobiliary: Evaluation for focal lesion is limited on arterial phase imaging. There are nodular hepatic contours. No evidence of focal lesion. Partially distended gallbladder without calcified gallstone. Pancreas: No ductal dilatation or inflammation. Spleen: Normal in size and arterial enhancement. Adrenals/Urinary Tract: No adrenal nodule. No hydronephrosis. No perinephric edema. Bilateral renal cortical cysts of varying sizes. Mild bladder distention without wall thickening. Stomach/Bowel: Bowel evaluation is limited in the absence of enteric contrast, arterial phase imaging, and paucity of intra-abdominal fat. Stomach is decompressed. No bowel dilatation or obstruction. No bowel inflammation. There are areas of mesenteric swirling but no evidence of ischemia or vascular compromise. The appendix is not definitively visualized. Lymphatic: No abdominopelvic adenopathy. Reproductive: Enlarged prostate gland causes mass effect on the bladder base. Penile prosthesis. Other: No ascites or free air. Musculoskeletal: Mild T12 and L1 superior endplate compression fractures. Mild L4 superior endplate compression fracture. Multilevel degenerative change in the lumbar spine with prominent  degenerative disc disease and facet hypertrophy. Grade 1 anterolisthesis of L4 on L5 and retrolisthesis of L3 on L4 likely degenerative and facet mediated. No evidence of focal bone lesion. Bones are diffusely under mineralized. Review of the MIP images confirms the above findings. IMPRESSION: 1. No aortic dissection, acute aortic syndrome, or acute aortic abnormality. 2. 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 3. Cardiomegaly with marked left atrial dilatation. Borders of the left atrial appendage are diffusely lobulated and thickened, potentially left atrial appendage thrombus measuring 12 mm. Recommend correlation with echocardiogram.  4. Advanced emphysema. 5. Small right pleural effusion which is partially lobulated, now with fluid in the major fissure. Trace left pleural effusion. 6. Mediastinal adenopathy stable from prior exam. Left supraclavicular node has slightly increased in size. Recommend attention at follow-up. 7. Enlarged prostate gland causes mass effect on the bladder base. 8. Mild T12 and L1 superior endplate compression fractures. Mild L4 superior endplate compression fracture. These fractures are age indeterminate. Aortic Atherosclerosis (ICD10-I70.0) and Emphysema (ICD10-J43.9). Electronically Signed   By: Keith Rake M.D.   On: 11/07/2019 20:56     Assessment & Plan:   COPD GOLD II - Breathings appears labored with light activity. No signs of acute exacerbation. Re-qualified for oxygen today. He needs 3-4L on exertion to keep O2 >88-90%. He is interested in [place] but recommend seeing if this would be an option at next visit after starting LAMA  - Given sample of Breztri today. Per pharmacy Trelegy will be 30 dollars. Will discuss changing at next visit.    Martyn Ehrich, NP 11/09/2019

## 2019-11-09 ENCOUNTER — Encounter: Payer: Self-pay | Admitting: Primary Care

## 2019-11-09 NOTE — Assessment & Plan Note (Signed)
-   Breathings appears labored with light activity. No signs of acute exacerbation. Re-qualified for oxygen today. He needs 3-4L on exertion to keep O2 >88-90%. He is interested in [place] but recommend seeing if this would be an option at next visit after starting LAMA  - Given sample of Breztri today. Per pharmacy Trelegy will be 30 dollars. Will discuss changing at next visit.

## 2019-11-13 ENCOUNTER — Emergency Department (HOSPITAL_COMMUNITY): Payer: Medicare Other

## 2019-11-13 ENCOUNTER — Ambulatory Visit (INDEPENDENT_AMBULATORY_CARE_PROVIDER_SITE_OTHER): Payer: Medicare Other | Admitting: Adult Health

## 2019-11-13 ENCOUNTER — Encounter: Payer: Self-pay | Admitting: Adult Health

## 2019-11-13 ENCOUNTER — Inpatient Hospital Stay (HOSPITAL_COMMUNITY)
Admission: EM | Admit: 2019-11-13 | Discharge: 2019-11-25 | DRG: 291 | Disposition: A | Discharge status: Home Health | Payer: Medicare Other | Attending: Student | Admitting: Student

## 2019-11-13 ENCOUNTER — Other Ambulatory Visit: Payer: Self-pay

## 2019-11-13 ENCOUNTER — Emergency Department (HOSPITAL_BASED_OUTPATIENT_CLINIC_OR_DEPARTMENT_OTHER): Payer: Medicare Other

## 2019-11-13 ENCOUNTER — Encounter (HOSPITAL_COMMUNITY): Payer: Self-pay | Admitting: Emergency Medicine

## 2019-11-13 VITALS — BP 110/60 | HR 73 | Temp 98.0°F

## 2019-11-13 DIAGNOSIS — J449 Chronic obstructive pulmonary disease, unspecified: Secondary | ICD-10-CM | POA: Diagnosis present

## 2019-11-13 DIAGNOSIS — Z9981 Dependence on supplemental oxygen: Secondary | ICD-10-CM

## 2019-11-13 DIAGNOSIS — I712 Thoracic aortic aneurysm, without rupture: Secondary | ICD-10-CM | POA: Diagnosis present

## 2019-11-13 DIAGNOSIS — Z515 Encounter for palliative care: Secondary | ICD-10-CM

## 2019-11-13 DIAGNOSIS — M25512 Pain in left shoulder: Secondary | ICD-10-CM | POA: Diagnosis not present

## 2019-11-13 DIAGNOSIS — E1165 Type 2 diabetes mellitus with hyperglycemia: Secondary | ICD-10-CM | POA: Diagnosis not present

## 2019-11-13 DIAGNOSIS — Z95810 Presence of automatic (implantable) cardiac defibrillator: Secondary | ICD-10-CM

## 2019-11-13 DIAGNOSIS — Z79899 Other long term (current) drug therapy: Secondary | ICD-10-CM

## 2019-11-13 DIAGNOSIS — R55 Syncope and collapse: Secondary | ICD-10-CM | POA: Diagnosis not present

## 2019-11-13 DIAGNOSIS — R0602 Shortness of breath: Secondary | ICD-10-CM

## 2019-11-13 DIAGNOSIS — R17 Unspecified jaundice: Secondary | ICD-10-CM | POA: Diagnosis present

## 2019-11-13 DIAGNOSIS — Z20822 Contact with and (suspected) exposure to covid-19: Secondary | ICD-10-CM | POA: Diagnosis present

## 2019-11-13 DIAGNOSIS — Z7984 Long term (current) use of oral hypoglycemic drugs: Secondary | ICD-10-CM

## 2019-11-13 DIAGNOSIS — I959 Hypotension, unspecified: Secondary | ICD-10-CM | POA: Diagnosis not present

## 2019-11-13 DIAGNOSIS — R338 Other retention of urine: Secondary | ICD-10-CM | POA: Diagnosis present

## 2019-11-13 DIAGNOSIS — R5381 Other malaise: Secondary | ICD-10-CM | POA: Diagnosis not present

## 2019-11-13 DIAGNOSIS — I252 Old myocardial infarction: Secondary | ICD-10-CM

## 2019-11-13 DIAGNOSIS — Z7982 Long term (current) use of aspirin: Secondary | ICD-10-CM

## 2019-11-13 DIAGNOSIS — I5022 Chronic systolic (congestive) heart failure: Secondary | ICD-10-CM | POA: Diagnosis not present

## 2019-11-13 DIAGNOSIS — M545 Low back pain, unspecified: Secondary | ICD-10-CM

## 2019-11-13 DIAGNOSIS — I5021 Acute systolic (congestive) heart failure: Secondary | ICD-10-CM | POA: Diagnosis not present

## 2019-11-13 DIAGNOSIS — M4856XA Collapsed vertebra, not elsewhere classified, lumbar region, initial encounter for fracture: Secondary | ICD-10-CM | POA: Diagnosis present

## 2019-11-13 DIAGNOSIS — I509 Heart failure, unspecified: Secondary | ICD-10-CM

## 2019-11-13 DIAGNOSIS — Z8719 Personal history of other diseases of the digestive system: Secondary | ICD-10-CM

## 2019-11-13 DIAGNOSIS — R000 Tachycardia, unspecified: Secondary | ICD-10-CM | POA: Diagnosis not present

## 2019-11-13 DIAGNOSIS — I259 Chronic ischemic heart disease, unspecified: Secondary | ICD-10-CM

## 2019-11-13 DIAGNOSIS — I4821 Permanent atrial fibrillation: Secondary | ICD-10-CM | POA: Diagnosis present

## 2019-11-13 DIAGNOSIS — I428 Other cardiomyopathies: Secondary | ICD-10-CM | POA: Diagnosis present

## 2019-11-13 DIAGNOSIS — R0902 Hypoxemia: Secondary | ICD-10-CM | POA: Diagnosis not present

## 2019-11-13 DIAGNOSIS — E785 Hyperlipidemia, unspecified: Secondary | ICD-10-CM | POA: Diagnosis present

## 2019-11-13 DIAGNOSIS — I5043 Acute on chronic combined systolic (congestive) and diastolic (congestive) heart failure: Secondary | ICD-10-CM | POA: Diagnosis present

## 2019-11-13 DIAGNOSIS — Z823 Family history of stroke: Secondary | ICD-10-CM

## 2019-11-13 DIAGNOSIS — K219 Gastro-esophageal reflux disease without esophagitis: Secondary | ICD-10-CM | POA: Diagnosis present

## 2019-11-13 DIAGNOSIS — R54 Age-related physical debility: Secondary | ICD-10-CM | POA: Diagnosis present

## 2019-11-13 DIAGNOSIS — Z789 Other specified health status: Secondary | ICD-10-CM | POA: Diagnosis not present

## 2019-11-13 DIAGNOSIS — Z888 Allergy status to other drugs, medicaments and biological substances status: Secondary | ICD-10-CM

## 2019-11-13 DIAGNOSIS — R079 Chest pain, unspecified: Secondary | ICD-10-CM

## 2019-11-13 DIAGNOSIS — Q2549 Other congenital malformations of aorta: Secondary | ICD-10-CM | POA: Diagnosis not present

## 2019-11-13 DIAGNOSIS — R531 Weakness: Secondary | ICD-10-CM

## 2019-11-13 DIAGNOSIS — Z7189 Other specified counseling: Secondary | ICD-10-CM

## 2019-11-13 DIAGNOSIS — R0789 Other chest pain: Secondary | ICD-10-CM | POA: Diagnosis not present

## 2019-11-13 DIAGNOSIS — Z87891 Personal history of nicotine dependence: Secondary | ICD-10-CM

## 2019-11-13 DIAGNOSIS — Z7951 Long term (current) use of inhaled steroids: Secondary | ICD-10-CM

## 2019-11-13 DIAGNOSIS — M19012 Primary osteoarthritis, left shoulder: Secondary | ICD-10-CM | POA: Diagnosis not present

## 2019-11-13 DIAGNOSIS — Z8673 Personal history of transient ischemic attack (TIA), and cerebral infarction without residual deficits: Secondary | ICD-10-CM

## 2019-11-13 DIAGNOSIS — I700 Atherosclerosis of aorta: Secondary | ICD-10-CM | POA: Diagnosis present

## 2019-11-13 DIAGNOSIS — J9601 Acute respiratory failure with hypoxia: Secondary | ICD-10-CM | POA: Diagnosis not present

## 2019-11-13 DIAGNOSIS — Z953 Presence of xenogenic heart valve: Secondary | ICD-10-CM

## 2019-11-13 DIAGNOSIS — R600 Localized edema: Secondary | ICD-10-CM | POA: Diagnosis not present

## 2019-11-13 DIAGNOSIS — D509 Iron deficiency anemia, unspecified: Secondary | ICD-10-CM | POA: Diagnosis not present

## 2019-11-13 DIAGNOSIS — I2721 Secondary pulmonary arterial hypertension: Secondary | ICD-10-CM | POA: Diagnosis not present

## 2019-11-13 DIAGNOSIS — M5136 Other intervertebral disc degeneration, lumbar region: Secondary | ICD-10-CM | POA: Diagnosis not present

## 2019-11-13 DIAGNOSIS — I272 Pulmonary hypertension, unspecified: Secondary | ICD-10-CM | POA: Diagnosis present

## 2019-11-13 DIAGNOSIS — R609 Edema, unspecified: Secondary | ICD-10-CM

## 2019-11-13 DIAGNOSIS — Z66 Do not resuscitate: Secondary | ICD-10-CM | POA: Diagnosis present

## 2019-11-13 DIAGNOSIS — Z85118 Personal history of other malignant neoplasm of bronchus and lung: Secondary | ICD-10-CM

## 2019-11-13 DIAGNOSIS — I701 Atherosclerosis of renal artery: Secondary | ICD-10-CM | POA: Diagnosis present

## 2019-11-13 DIAGNOSIS — G894 Chronic pain syndrome: Secondary | ICD-10-CM | POA: Diagnosis not present

## 2019-11-13 DIAGNOSIS — I110 Hypertensive heart disease with heart failure: Principal | ICD-10-CM | POA: Diagnosis present

## 2019-11-13 DIAGNOSIS — J9621 Acute and chronic respiratory failure with hypoxia: Secondary | ICD-10-CM | POA: Diagnosis present

## 2019-11-13 DIAGNOSIS — Z8619 Personal history of other infectious and parasitic diseases: Secondary | ICD-10-CM

## 2019-11-13 DIAGNOSIS — E1169 Type 2 diabetes mellitus with other specified complication: Secondary | ICD-10-CM

## 2019-11-13 DIAGNOSIS — I5023 Acute on chronic systolic (congestive) heart failure: Secondary | ICD-10-CM | POA: Diagnosis not present

## 2019-11-13 DIAGNOSIS — R296 Repeated falls: Secondary | ICD-10-CM | POA: Diagnosis present

## 2019-11-13 LAB — CBC WITH DIFFERENTIAL/PLATELET
Abs Immature Granulocytes: 0.04 10*3/uL (ref 0.00–0.07)
Basophils Absolute: 0.1 10*3/uL (ref 0.0–0.1)
Basophils Relative: 1 %
Eosinophils Absolute: 0.1 10*3/uL (ref 0.0–0.5)
Eosinophils Relative: 1 %
HCT: 34.3 % — ABNORMAL LOW (ref 39.0–52.0)
Hemoglobin: 9.3 g/dL — ABNORMAL LOW (ref 13.0–17.0)
Immature Granulocytes: 1 %
Lymphocytes Relative: 7 %
Lymphs Abs: 0.6 10*3/uL — ABNORMAL LOW (ref 0.7–4.0)
MCH: 20.9 pg — ABNORMAL LOW (ref 26.0–34.0)
MCHC: 27.1 g/dL — ABNORMAL LOW (ref 30.0–36.0)
MCV: 77.3 fL — ABNORMAL LOW (ref 80.0–100.0)
Monocytes Absolute: 1 10*3/uL (ref 0.1–1.0)
Monocytes Relative: 11 %
Neutro Abs: 6.9 10*3/uL (ref 1.7–7.7)
Neutrophils Relative %: 79 %
Platelets: 228 10*3/uL (ref 150–400)
RBC: 4.44 MIL/uL (ref 4.22–5.81)
RDW: 19.1 % — ABNORMAL HIGH (ref 11.5–15.5)
WBC: 8.6 10*3/uL (ref 4.0–10.5)
nRBC: 0.2 % (ref 0.0–0.2)

## 2019-11-13 LAB — RESPIRATORY PANEL BY RT PCR (FLU A&B, COVID)
Influenza A by PCR: NEGATIVE
Influenza B by PCR: NEGATIVE
SARS Coronavirus 2 by RT PCR: NEGATIVE

## 2019-11-13 LAB — BASIC METABOLIC PANEL
Anion gap: 12 (ref 5–15)
BUN: 24 mg/dL — ABNORMAL HIGH (ref 8–23)
CO2: 20 mmol/L — ABNORMAL LOW (ref 22–32)
Calcium: 8.2 mg/dL — ABNORMAL LOW (ref 8.9–10.3)
Chloride: 105 mmol/L (ref 98–111)
Creatinine, Ser: 0.98 mg/dL (ref 0.61–1.24)
GFR calc Af Amer: >60 mL/min (ref >60)
GFR calc non Af Amer: >60 mL/min (ref >60)
Glucose, Bld: 122 mg/dL — ABNORMAL HIGH (ref 70–99)
Potassium: 4.5 mmol/L (ref 3.5–5.1)
Sodium: 137 mmol/L (ref 135–145)

## 2019-11-13 LAB — BRAIN NATRIURETIC PEPTIDE: B Natriuretic Peptide: 1448.6 pg/mL — ABNORMAL HIGH (ref 0.0–100.0)

## 2019-11-13 LAB — TROPONIN I (HIGH SENSITIVITY)
Troponin I (High Sensitivity): 18 ng/L — ABNORMAL HIGH (ref <18)
Troponin I (High Sensitivity): 20 ng/L — ABNORMAL HIGH (ref <18)

## 2019-11-13 LAB — CBG MONITORING, ED: Glucose-Capillary: 195 mg/dL — ABNORMAL HIGH (ref 70–99)

## 2019-11-13 MED ORDER — INSULIN ASPART 100 UNIT/ML ~~LOC~~ SOLN
0.0000 [IU] | Freq: Three times a day (TID) | SUBCUTANEOUS | Status: DC
  Administered 2019-11-14: 09:00:00 3 [IU] via SUBCUTANEOUS
  Administered 2019-11-14: 2 [IU] via SUBCUTANEOUS
  Administered 2019-11-15: 17:00:00 1 [IU] via SUBCUTANEOUS
  Administered 2019-11-15: 12:00:00 2 [IU] via SUBCUTANEOUS
  Administered 2019-11-15 – 2019-11-16 (×2): 1 [IU] via SUBCUTANEOUS
  Administered 2019-11-16 (×2): 2 [IU] via SUBCUTANEOUS
  Administered 2019-11-17: 18:00:00 1 [IU] via SUBCUTANEOUS
  Administered 2019-11-17 – 2019-11-18 (×4): 2 [IU] via SUBCUTANEOUS
  Administered 2019-11-19: 06:00:00 1 [IU] via SUBCUTANEOUS
  Administered 2019-11-19: 12:00:00 2 [IU] via SUBCUTANEOUS
  Administered 2019-11-19: 5 [IU] via SUBCUTANEOUS
  Administered 2019-11-20: 12:00:00 2 [IU] via SUBCUTANEOUS
  Administered 2019-11-20 (×2): 1 [IU] via SUBCUTANEOUS
  Administered 2019-11-21: 2 [IU] via SUBCUTANEOUS
  Administered 2019-11-21: 1 [IU] via SUBCUTANEOUS
  Administered 2019-11-22 – 2019-11-23 (×2): 2 [IU] via SUBCUTANEOUS
  Administered 2019-11-24: 1 [IU] via SUBCUTANEOUS
  Administered 2019-11-24: 2 [IU] via SUBCUTANEOUS
  Administered 2019-11-24 – 2019-11-25 (×2): 1 [IU] via SUBCUTANEOUS

## 2019-11-13 MED ORDER — FUROSEMIDE 10 MG/ML IJ SOLN
40.0000 mg | Freq: Once | INTRAMUSCULAR | Status: AC
  Administered 2019-11-13: 16:00:00 40 mg via INTRAVENOUS
  Filled 2019-11-13: qty 4

## 2019-11-13 MED ORDER — ASPIRIN EC 81 MG PO TBEC
81.0000 mg | DELAYED_RELEASE_TABLET | Freq: Every day | ORAL | Status: DC
  Administered 2019-11-13 – 2019-11-25 (×13): 81 mg via ORAL
  Filled 2019-11-13 (×13): qty 1

## 2019-11-13 MED ORDER — SENNOSIDES-DOCUSATE SODIUM 8.6-50 MG PO TABS: 1.0000 | ORAL_TABLET | Freq: Every evening | ORAL | Status: DC | PRN

## 2019-11-13 MED ORDER — FUROSEMIDE 10 MG/ML IJ SOLN
40.0000 mg | Freq: Two times a day (BID) | INTRAMUSCULAR | Status: DC
  Administered 2019-11-14 – 2019-11-16 (×5): 40 mg via INTRAVENOUS
  Filled 2019-11-13 (×5): qty 4

## 2019-11-13 MED ORDER — INSULIN ASPART 100 UNIT/ML ~~LOC~~ SOLN
0.0000 [IU] | Freq: Every day | SUBCUTANEOUS | Status: DC
  Administered 2019-11-18 – 2019-11-19 (×2): 2 [IU] via SUBCUTANEOUS

## 2019-11-13 MED ORDER — ALBUTEROL SULFATE (2.5 MG/3ML) 0.083% IN NEBU: 2.5000 mg | INHALATION_SOLUTION | RESPIRATORY_TRACT | Status: DC | PRN

## 2019-11-13 MED ORDER — ACETAMINOPHEN 650 MG RE SUPP: 650.0000 mg | Freq: Four times a day (QID) | RECTAL | Status: DC | PRN

## 2019-11-13 MED ORDER — MOMETASONE FURO-FORMOTEROL FUM 200-5 MCG/ACT IN AERO
2.0000 | INHALATION_SPRAY | Freq: Two times a day (BID) | RESPIRATORY_TRACT | Status: DC
  Administered 2019-11-14 – 2019-11-25 (×22): 2 via RESPIRATORY_TRACT
  Filled 2019-11-13: qty 8.8

## 2019-11-13 MED ORDER — NITROGLYCERIN 0.4 MG SL SUBL: 0.4000 mg | SUBLINGUAL_TABLET | SUBLINGUAL | Status: DC | PRN

## 2019-11-13 MED ORDER — ACETAMINOPHEN 325 MG PO TABS
650.0000 mg | ORAL_TABLET | Freq: Four times a day (QID) | ORAL | Status: DC | PRN
  Administered 2019-11-15 – 2019-11-17 (×2): 650 mg via ORAL
  Filled 2019-11-13 (×2): qty 2

## 2019-11-13 MED ORDER — ATORVASTATIN CALCIUM 10 MG PO TABS
10.0000 mg | ORAL_TABLET | Freq: Every day | ORAL | Status: DC
  Administered 2019-11-13 – 2019-11-25 (×13): 10 mg via ORAL
  Filled 2019-11-13 (×13): qty 1

## 2019-11-13 NOTE — ED Provider Notes (Signed)
[facility] EMERGENCY DEPARTMENT Provider Note   CSN: [id number] Arrival date & time: 11/13/19  1442     History Chief Complaint  Patient presents with  . Shortness of Breath    [name] is a 74 y.o. male.  Pt presents to the ED today with sob.  The pt said he's been sob for the past few days with leg swelling.  The pt initially went to his pcp and was sent here because his O2 sats were 84% on 4L.  The pt is normally on 4L.  The pt has had both of his Covid vaccines.        Past Medical History:  Diagnosis Date  . Atrial septal defect    Closed with surgery January, 2010  . Automatic implantable cardioverter-defibrillator in situ    LV dysfunction and pacer needed for AV node lesion  . Chronic combined systolic and diastolic CHF (congestive heart failure) ([place])   . Colon polyps   . COPD (chronic obstructive pulmonary disease) (HCC)    O2- 2 liters, nasal cannula, q night   . CVA (cerebral vascular accident) ([place]) 2009   denies residual on 08/14/2013  . Diabetes mellitus without complication ([place])    type 2  . Dyslipidemia   . Endocarditis    Bacterial, 2009  . Hypertension   . Intracranial hemorrhage (HCC)    Coumadin cannot be used because of the history of his bleed  . Lung cancer ([place]) 11/29/2013   T1N0 Stage Ia non-small cell carcinoma left lung treated with wedge resection  . Myocardial infarction ([place]) 2010  . Pacemaker    combo pacer and icd  . Permanent atrial fibrillation    Originally Coumadin use for atrial fibrillation  //   he had intracerebral hemorrhage with an INR of 2.3 June, 2009. Anticoagulation could no longer be used.  //  Rapid atrial fibrillation after inferior MI October, 2010..........Marland Kitchen AV node ablation done at that time with ICD pacemaker placed (EF 35%).   //   Left atrial appendage tied off at the time of mitral valve surgery January, 2010 (maze pro  . Pneumonia 07/2018  . Pulmonary hypertension (HCC)    Moderate  .  Renal artery stenosis (HCC)    Mild by history  . 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 Spontaneous pneumothorax    right thoracotomy - distant past  . Status post minimally invasive mitral valve replacement with bioprosthetic valve    33 mm Medtronic Mosaic porcine bioprosthesis placed via right mini thoracotomy for bacterial endocarditis complicated by severe MR and CHF   . Thoracic aortic aneurysm ([place]) 08/11/2016   a - Chest CTA 1/18:  Aneurysmal dilatation of aortic root is noted at 5.1 cm.     Patient Active Problem List   Diagnosis Date Noted  . Elevated troponin   . Chronic atrial fibrillation ([place])   . Weakness generalized   . DNR (do not resuscitate)   . Acute on chronic combined systolic (congestive) and diastolic (congestive) heart failure ([place]) 04/27/2019  . COPD with acute exacerbation ([place]) 08/01/2018  . Pulmonary edema 07/30/2018  . Health care maintenance 05/09/2018  . Gastritis and gastroduodenitis   . Diverticulum of duodenum   . Aortic root dilatation ([place])   . 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 Atherosclerosis of aorta ([place]) 08/11/2016  . Orthostatic hypotension 09/26/2015  . Near syncope 09/24/2015  . Syncope 09/24/2015  . Hepatic cirrhosis ([place]) 09/25/2014  . History of  colonic polyps 09/25/2014  . Serum total bilirubin elevated 06/30/2014  . Follow-up examination, following unspecified surgery 01/27/2014  . Lung cancer ([place]) 11/29/2013  . Prosthetic valve dysfunction   . Chronic combined systolic and diastolic CHF (congestive heart failure) ([place])   . Diabetes ([place]) 11/04/2013  . Myocardial infarction ([place]) 11/01/2013  . Cardiomyopathy, nonischemic ([place]) 11/01/2013  . Acute on chronic respiratory failure with hypoxia ([place]) 08/15/2013  . H/O intracranial hemorrhage 08/13/2013  . H/O endocarditis 08/13/2013  . H/O: CVA (cerebrovascular accident) 08/13/2013  . Community acquired pneumonia 08/13/2013  . Chronic respiratory failure assoc with chf/  PAH 08/12/2013  . H/O atrioventricular nodal ablation   . S/P MVR (mitral valve replacement)   . Hypertension   . Dyslipidemia   . Permanent atrial fibrillation   . Ejection fraction < 50%   . ICD (implantable cardioverter-defibrillator), biventricular, in situ   . Renal artery stenosis ([place])   . Pulmonary hypertension ([place])   . Primary cancer of left upper lobe of lung ([place]) 04/20/2009  . COLONIC POLYPS, ADENOMATOUS 03/22/2007  . COPD GOLD II 01/11/2007  . GERD 01/11/2007    Past Surgical History:  Procedure Laterality Date  . APPENDECTOMY    . ASD REPAIR, SECUNDUM  07/17/2008   pericardial patch closure of ASD  . AV NODE ABLATION  07/2008   for rapid atrial fib  . CARDIAC CATHETERIZATION    . CARDIAC DEFIBRILLATOR PLACEMENT  ~ [address] [employer]  . CARDIAC VALVE REPLACEMENT    . CATARACT EXTRACTION W/ INTRAOCULAR LENS  IMPLANT, BILATERAL Bilateral   . ESOPHAGOGASTRODUODENOSCOPY (EGD) WITH PROPOFOL N/A 09/29/2016   Procedure: ESOPHAGOGASTRODUODENOSCOPY (EGD) WITH PROPOFOL;  Surgeon: [name], MD;  Location: [facility] ENDOSCOPY;  Service: Endoscopy;  Laterality: N/A;  . HERNIA REPAIR    . IMPLANTABLE CARDIOVERTER DEFIBRILLATOR (ICD) GENERATOR CHANGE N/A 02/06/2012   Procedure: ICD GENERATOR CHANGE;  Surgeon: [name], MD;  Location: [facility] CATH LAB;  Service: Cardiovascular;  Laterality: N/A;  . INSERT / REPLACE / REMOVE PACEMAKER    . MASS BIOPSY Left    neck mass  . MITRAL VALVE REPLACEMENT Right 07/17/2008   38mm Medtronic Mosaic porcine bioprosthesis  . PENILE PROSTHESIS IMPLANT    . RIGHT HEART CATHETERIZATION N/A 08/16/2013   Procedure: RIGHT HEART CATH;  Surgeon: Josue Hector, MD;  Location: [facility] CATH LAB;  Service: Cardiovascular;  Laterality: N/A;  . TEE WITHOUT CARDIOVERSION N/A 09/06/2016   Procedure: TRANSESOPHAGEAL ECHOCARDIOGRAM (TEE);  Surgeon: Dorothy Spark, MD;  Location: [facility] ENDOSCOPY;  Service: Cardiovascular;  Laterality: N/A;  . THORACOTOMY Right 1970's    spontaneous pneumothorax - while in the [place]  . TOE SURGERY     left foot hammer toe  . TONSILLECTOMY    . VIDEO ASSISTED THORACOSCOPY (VATS)/WEDGE RESECTION Left 11/29/2013   Procedure: Video assisted thoracoscopy for wedge resection; mini thoracotomy;  Surgeon: [name], MD;  Location: [facility] OR;  Service: Thoracic;  Laterality: Left;       Family History  Problem Relation Age of Onset  . Stomach cancer Father   . Stroke Mother     Social History   Tobacco Use  . Smoking status: Former Smoker    Packs/day: 1.00    Years: 45.00    Pack years: 45.00    Types: Cigarettes    Quit date: 2011    Years since quitting: 10.3  . Smokeless tobacco: Never Used  Substance Use Topics  . Alcohol use: No  Alcohol/week: 0.0 standard drinks    Comment: 08/14/2013 "used to drink beer; quit:in 1982"  . Drug use: No    Home Medications Prior to Admission medications   Medication Sig Start Date End Date Taking? Authorizing Provider  amoxicillin (AMOXIL) 500 MG capsule Take 2,000 mg by mouth See admin instructions. Take 2,000 mg by mouth one hour prior to dental appointments   Yes [provider]  aspirin EC 81 MG tablet Take 1 tablet (81 mg total) by mouth daily. 12/07/12  Yes Carlena Bjornstad, MD  atorvastatin (LIPITOR) 10 MG tablet Take 10 mg by mouth daily.   Yes [provider]  Budeson-Glycopyrrol-Formoterol (BREZTRI AEROSPHERE) 160-9-4.8 MCG/ACT AERO Inhale 2 puffs into the lungs in the morning and at bedtime. 11/08/19  Yes Martyn Ehrich, NP  furosemide (LASIX) 40 MG tablet TAKE 1 TABLET BY MOUTH TWICE A DAY Patient taking differently: Take 40 mg by mouth daily.  09/04/19  Yes Nafziger, Tommi Rumps, NP  metFORMIN (GLUCOPHAGE) 1000 MG tablet TAKE 1 TABLET BY MOUTH TWICE A DAY WITH MEALS Patient taking differently: Take 1,000 mg by mouth 2 (two) times daily with a meal.  09/13/19  Yes Nafziger, Tommi Rumps, NP  nitroGLYCERIN (NITROSTAT) 0.4 MG SL tablet Place 0.4 mg under the  tongue every 5 (five) minutes x 3 doses as needed for chest pain.    Yes [provider]  OXYGEN Inhale 4 L/min into the lungs continuous.    Yes [provider]  potassium chloride SA (KLOR-CON) 20 MEQ tablet Take 2 tablets (40 mEq total) by mouth daily. 08/12/19 11/13/19 Yes Nafziger, Tommi Rumps, NP  budesonide-formoterol (SYMBICORT) 160-4.5 MCG/ACT inhaler INHALE 2 PUFFS INTO THE LUNGS TWICE A DAY Patient not taking: Reported on 11/13/2019 05/22/19   Lauraine Rinne, NP  metoprolol succinate (TOPROL-XL) 25 MG 24 hr tablet Take 0.5 tablets (12.5 mg total) by mouth daily. Patient not taking: Reported on 11/13/2019 06/11/19   Niel Hummer A, MD    Allergies    Anticoagulant compound, Other, and Warfarin sodium  Review of Systems   Review of Systems  Respiratory: Positive for shortness of breath.   Cardiovascular: Positive for leg swelling.  All other systems reviewed and are negative.   Physical Exam Updated Vital Signs BP 115/75   Pulse 73   Temp 97.8 F (36.6 C) (Oral)   Resp 15   SpO2 100%   Physical Exam Vitals and nursing note reviewed.  Constitutional:      Comments: Chronically ill appearing  HENT:     Head: Normocephalic and atraumatic.     Mouth/Throat:     Mouth: Mucous membranes are moist.     Pharynx: Oropharynx is clear.  Eyes:     Extraocular Movements: Extraocular movements intact.     Pupils: Pupils are equal, round, and reactive to light.  Cardiovascular:     Rate and Rhythm: Normal rate and regular rhythm.  Pulmonary:     Effort: Pulmonary effort is normal.     Breath sounds: Rhonchi present.  Abdominal:     General: Bowel sounds are normal.     Palpations: Abdomen is soft.  Musculoskeletal:        General: Normal range of motion.     Cervical back: Normal range of motion and neck supple.     Right lower leg: Edema present.     Left lower leg: Edema present.  Skin:    General: Skin is warm.     Capillary Refill: Capillary refill takes  less  than 2 seconds.  Neurological:     General: No focal deficit present.     Mental Status: He is alert and oriented to person, place, and time.  Psychiatric:        Behavior: Behavior normal.     ED Results / Procedures / Treatments   Labs (all labs ordered are listed, but only abnormal results are displayed) Labs Reviewed  BASIC METABOLIC PANEL - Abnormal; Notable for the following components:      Result Value   CO2 20 (*)    Glucose, Bld 122 (*)    BUN 24 (*)    Calcium 8.2 (*)    All other components within normal limits  CBC WITH DIFFERENTIAL/PLATELET - Abnormal; Notable for the following components:   Hemoglobin 9.3 (*)    HCT 34.3 (*)    MCV 77.3 (*)    MCH 20.9 (*)    MCHC 27.1 (*)    RDW 19.1 (*)    Lymphs Abs 0.6 (*)    All other components within normal limits  BRAIN NATRIURETIC PEPTIDE - Abnormal; Notable for the following components:   B Natriuretic Peptide 1,448.6 (*)    All other components within normal limits  TROPONIN I (HIGH SENSITIVITY) - Abnormal; Notable for the following components:   Troponin I (High Sensitivity) 18 (*)    All other components within normal limits  TROPONIN I (HIGH SENSITIVITY) - Abnormal; Notable for the following components:   Troponin I (High Sensitivity) 20 (*)    All other components within normal limits  RESPIRATORY PANEL BY RT PCR (FLU A&B, COVID)    EKG EKG Interpretation  Date/Time:  Wednesday Nov 13 2019 14:44:08 EDT Ventricular Rate:  73 PR Interval:    QRS Duration: 150 QT Interval:  460 QTC Calculation: 507 R Axis:   -115 Text Interpretation: paced rhythm Right bundle branch block Inferior infarct, old Confirmed by Isla Pence [phone]) on 11/13/2019 3:31:43 PM   Radiology DG Chest 2 View  Result Date: 11/13/2019 CLINICAL DATA:  Shortness of breath. EXAM: CHEST - 2 VIEW COMPARISON:  November 07, 2019. FINDINGS: Stable cardiomegaly. Right-sided pacemaker is unchanged in position. No pneumothorax is noted. Stable  bilateral perihilar and basilar lung opacities are noted concerning for possible edema or atypical inflammation with associated pleural effusions. Bony thorax is unremarkable. IMPRESSION: Stable bilateral perihilar and basilar lung opacities are noted concerning for possible edema or atypical inflammation with associated pleural effusions. Electronically Signed   By: Marijo Conception M.D.   On: 11/13/2019 15:38   VAS [place] LOWER EXTREMITY VENOUS (DVT) (ONLY [facility] & [facility])  Result Date: 11/13/2019  Lower Venous DVTStudy Indications: Edema.  Performing Technologist: June Leap RDMS, RVT  Examination Guidelines: A complete evaluation includes B-mode imaging, spectral Doppler, color Doppler, and power Doppler as needed of all accessible portions of each vessel. Bilateral testing is considered an integral part of a complete examination. Limited examinations for reoccurring indications may be performed as noted. The reflux portion of the exam is performed with the patient in reverse Trendelenburg.  +---------+---------------+---------+-----------+----------+--------------+ RIGHT    CompressibilityPhasicitySpontaneityPropertiesThrombus Aging +---------+---------------+---------+-----------+----------+--------------+ CFV      Full           Yes      Yes                                 +---------+---------------+---------+-----------+----------+--------------+ SFJ      Full                                                        +---------+---------------+---------+-----------+----------+--------------+  FV Prox  Full                                                        +---------+---------------+---------+-----------+----------+--------------+ FV Mid   Full                                                        +---------+---------------+---------+-----------+----------+--------------+ FV DistalFull                                                         +---------+---------------+---------+-----------+----------+--------------+ PFV      Full                                                        +---------+---------------+---------+-----------+----------+--------------+ POP      Full           Yes      Yes                                 +---------+---------------+---------+-----------+----------+--------------+ PTV      Full                                                        +---------+---------------+---------+-----------+----------+--------------+ PERO     Full                                                        +---------+---------------+---------+-----------+----------+--------------+   +---------+---------------+---------+-----------+----------+--------------+ LEFT     CompressibilityPhasicitySpontaneityPropertiesThrombus Aging +---------+---------------+---------+-----------+----------+--------------+ CFV      Full           Yes      Yes                                 +---------+---------------+---------+-----------+----------+--------------+ SFJ      Full                                                        +---------+---------------+---------+-----------+----------+--------------+ FV Prox  Full                                                        +---------+---------------+---------+-----------+----------+--------------+  FV Mid   Full                                                        +---------+---------------+---------+-----------+----------+--------------+ FV DistalFull                                                        +---------+---------------+---------+-----------+----------+--------------+ PFV      Full                                                        +---------+---------------+---------+-----------+----------+--------------+ POP      Full           Yes      Yes                                  +---------+---------------+---------+-----------+----------+--------------+ PTV      Full                                                        +---------+---------------+---------+-----------+----------+--------------+ PERO     Full                                                        +---------+---------------+---------+-----------+----------+--------------+     Summary: RIGHT: - There is no evidence of deep vein thrombosis in the lower extremity.  - No cystic structure found in the popliteal fossa.  LEFT: - There is no evidence of deep vein thrombosis in the lower extremity.  - No cystic structure found in the popliteal fossa.  *See table(s) above for measurements and observations.    Preliminary     Procedures Procedures (including critical care time)  Medications Ordered in ED Medications  furosemide (LASIX) injection 40 mg (40 mg Intravenous Given 11/13/19 1613)    ED Course  I have reviewed the triage vital signs and the nursing notes.  Pertinent labs & imaging results that were available during my care of the patient were reviewed by me and considered in my medical decision making (see chart for details).    MDM Rules/Calculators/A&P                      With any kind of movement, Pt's O2 sats drop to the mid-80s.  I increased his oxygen up to 5L.  Pt was given 40 mg lasix IV for his pulmonary and peripheral edema.  BP dropped to the 90s.   Pt d/w Dr. Andria Frames (triad) for admission.  CRITICAL CARE Performed by: Isla Pence   Total critical care time: 30 minutes  Critical care time  was exclusive of separately billable procedures and treating other patients.  Critical care was necessary to treat or prevent imminent or life-threatening deterioration.  Critical care was time spent personally by me on the following activities: development of treatment plan with patient and/or surrogate as well as nursing, discussions with consultants, evaluation of patient's  response to treatment, examination of patient, obtaining history from patient or surrogate, ordering and performing treatments and interventions, ordering and review of laboratory studies, ordering and review of radiographic studies, pulse oximetry and re-evaluation of patient's condition.  Final Clinical Impression(s) / ED Diagnoses Final diagnoses:  Acute on chronic congestive heart failure, unspecified heart failure type ([place])  Acute respiratory failure with hypoxia [facility])    Rx / DC Orders ED Discharge Orders    None       Isla Pence, MD 11/13/19 2018

## 2019-11-13 NOTE — Progress Notes (Signed)
Subjective:    Patient ID: [name], male    DOB: 1946/01/05, 74 y.o.   MRN: [id number]  HPI 74 year old male who  has a past medical history of Atrial septal defect, Automatic implantable cardioverter-defibrillator in situ, Chronic combined systolic and diastolic CHF (congestive heart failure) ([place]), Colon polyps, COPD (chronic obstructive pulmonary disease) ([place]), CVA (cerebral vascular accident) ([place]) (2009), Diabetes mellitus without complication ([place]), Dyslipidemia, Endocarditis, Hypertension, Intracranial hemorrhage ([place]), Lung cancer ([place]) (11/29/2013), Myocardial infarction ([place]) (2010), Pacemaker, Permanent atrial fibrillation, Pneumonia (07/2018), Pulmonary hypertension ([place]), Renal artery stenosis (HCC), 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  He presents to the office today for bilateral lower extremity edema.  Is a patient that normally walks in under his own power but today had to be brought in to the room in a wheelchair due to difficulty walking due to low back pain, lower extremity edema, worsening shortness of breath, and generalized weakness.  He was seen in the emergency room on 11/07/2019 with chest pain.  His work-up showed an EKG without ischemic changes, negative troponin x2, hemoglobin at 8.7 which is slightly lower than baseline.  His CTA  was negative but the radiologist did mention a couple of mild endplate compression fractures as well as a potential left atrial appendage thrombus measuring 12 mm and slightly enlarged lymph nodes.  He is not a candidate for anticoagulation due to CVA in the past  Today he reports "this is the worst of ever felt".  As mentioned above he is complaining of lower extremity edema that has been present for 2 days bilaterally despite taking his Lasix, lower pelvic pain that has been present  for approximately 1 week, low back pain, generalized weakness, and worsening shortness of breath.  In the office CP appears to be a little bit more pale than normal.  Oxygen saturation is at 88% on 4 L via nasal cannula-which is his baseline.  He does have significant lower extremity edema that is new for him.  He does report that on Monday he took his daughter to have foot surgery and waited in his truck for 5 hours why she was having the surgery performed.  He denies dark tarry stools  Review of Systems See HPI   Past Medical History:  Diagnosis Date  . Atrial septal defect    Closed with surgery January, 2010  . Automatic implantable cardioverter-defibrillator in situ    LV dysfunction and pacer needed for AV node lesion  . Chronic combined systolic and diastolic CHF (congestive heart failure) ([place])   . Colon polyps   . COPD (chronic obstructive pulmonary disease) (HCC)    O2- 2 liters, nasal cannula, q night   . CVA (cerebral vascular accident) ([place]) 2009   denies residual on 08/14/2013  . Diabetes mellitus without complication ([place])    type 2  . Dyslipidemia   . Endocarditis    Bacterial, 2009  . Hypertension   . Intracranial hemorrhage (HCC)    Coumadin cannot be used because of the history of his bleed  . Lung cancer ([place]) 11/29/2013   T1N0 Stage Ia non-small cell carcinoma left lung treated with wedge resection  . Myocardial infarction ([place]) 2010  . Pacemaker    combo pacer and icd  . Permanent atrial fibrillation    Originally Coumadin use for atrial fibrillation  //   he had intracerebral hemorrhage with  an INR of 2.3 June, 2009. Anticoagulation could no longer be used.  //  Rapid atrial fibrillation after inferior MI October, 2010..........Marland Kitchen AV node ablation done at that time with ICD pacemaker placed (EF 35%).   //   Left atrial appendage tied off at the time of mitral valve surgery January, 2010 (maze pro  . Pneumonia 07/2018  . Pulmonary hypertension (HCC)     Moderate  . Renal artery stenosis (HCC)    Mild by history  . 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 Spontaneous pneumothorax    right thoracotomy - distant past  . Status post minimally invasive mitral valve replacement with bioprosthetic valve    33 mm Medtronic Mosaic porcine bioprosthesis placed via right mini thoracotomy for bacterial endocarditis complicated by severe MR and CHF   . Thoracic aortic aneurysm ([place]) 08/11/2016   a - Chest CTA 1/18:  Aneurysmal dilatation of aortic root is noted at 5.1 cm.     Social History   Socioeconomic History  . Marital status: Married    Spouse name: [name]  . Number of children: 2  . Years of education: College  . Highest education level: Not on file  Occupational History  . 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 Smoking status: Former Smoker    Packs/day: 1.00    Years: 45.00    Pack years: 45.00    Types: Cigarettes    Quit date: 2011    Years since quitting: 10.3  . Smokeless tobacco: Never Used  Substance and Sexual Activity  . Alcohol use: No    Alcohol/week: 0.0 standard drinks    Comment: 08/14/2013 "used to drink beer; quit:in 1982"  . Drug use: No  . Sexual activity: Yes  Other Topics Concern  . Not on file  Social History Narrative   Patient lives at home with his spouse.   Worked for the post office   Has two boys and a girl. All live local.    1 Mining engineer   Social Determinants of Health   Financial Resource Strain: Low Risk   . Difficulty of Paying Living Expenses: Not hard at all  Food Insecurity: No Food Insecurity  . Worried About Charity fundraiser in the Last Year: Never true  . Ran Out of Food in the Last Year: Never true  Transportation Needs: No Transportation Needs  . Lack of Transportation (Medical): No  . Lack of Transportation (Non-Medical): No  Physical Activity: Inactive  . Days of Exercise per Week: 0 days  . Minutes of Exercise per  Session: 0 min  Stress: No Stress Concern Present  . Feeling of Stress : Only a little  Social Connections: Unknown  . Frequency of Communication with Friends and Family: More than three times a week  . Frequency of Social Gatherings with Friends and Family: Three times a week  . Attends Religious Services: Not on file  . Active Member of Clubs or Organizations: Not on file  . Attends Archivist Meetings: Not on file  . Marital Status: Married  Human resources officer Violence:   . Fear of Current or Ex-Partner:   . Emotionally Abused:   Marland Kitchen Physically Abused:   . Sexually Abused:     Past Surgical History:  Procedure Laterality Date  . APPENDECTOMY    . ASD REPAIR, SECUNDUM  07/17/2008   pericardial patch closure of ASD  . AV  NODE ABLATION  07/2008   for rapid atrial fib  . CARDIAC CATHETERIZATION    . CARDIAC DEFIBRILLATOR PLACEMENT  ~ [address] [place]  . CARDIAC VALVE REPLACEMENT    . CATARACT EXTRACTION W/ INTRAOCULAR LENS  IMPLANT, BILATERAL Bilateral   . ESOPHAGOGASTRODUODENOSCOPY (EGD) WITH PROPOFOL N/A 09/29/2016   Procedure: ESOPHAGOGASTRODUODENOSCOPY (EGD) WITH PROPOFOL;  Surgeon: [name], MD;  Location: [facility] ENDOSCOPY;  Service: Endoscopy;  Laterality: N/A;  . HERNIA REPAIR    . IMPLANTABLE CARDIOVERTER DEFIBRILLATOR (ICD) GENERATOR CHANGE N/A 02/06/2012   Procedure: ICD GENERATOR CHANGE;  Surgeon: [name], MD;  Location[facility] CATH LAB;  Service: Cardiovascular;  Laterality: N/A;  . INSERT / REPLACE / REMOVE PACEMAKER    . MASS BIOPSY Left    neck mass  . MITRAL VALVE REPLACEMENT Right 07/17/2008   74mm Medtronic Mosaic porcine bioprosthesis  . PENILE PROSTHESIS IMPLANT    . RIGHT HEART CATHETERIZATION N/A 08/16/2013   Procedure: RIGHT HEART CATH;  Surgeon: Josue Hector, MD;  Location[facility] C[facility] LAB;  Service: Cardiovascular;  Laterality: N/A;  . TEE WITHOUT CARDIOVERSION N/A 09/06/2016   Procedure: TRANSESOPHAGEAL ECHOCARDIOGRAM (TEE);  Surgeon: Dorothy Spark, MD;  Location: [facility] ENDOSCOPY;  Service: Cardiovascular;  Laterality: N/A;  . THORACOTOMY Right 1970's   spontaneous pneumothorax - while in the [place]  . TOE SURGERY     left foot hammer toe  . TONSILLECTOMY    . VIDEO ASSISTED THORACOSCOPY (VATS)/WEDGE RESECTION Left 11/29/2013   Procedure: Video assisted thoracoscopy for wedge resection; mini thoracotomy;  Surgeon: [name], MD;  Location: [facility] OR;  Service: Thoracic;  Laterality: Left;    Family History  Problem Relation Age of Onset  . Stomach cancer Father   . Stroke Mother     Allergies  Allergen Reactions  . Anticoagulant Compound Other (See Comments)    Pt had intracranial bleed, therefore all anticoagulation is contraindicated per Dr. Ron Parker  . Other Other (See Comments)    Per Dr. Halford Chessman (Surgeon): stated that the patient cannot be put under for any surgery, as he has an enlarged aorta. He would stand only a 50/50 chance of surviving. He has lung issues, diminished lung tissue, COPD, and emphysema.  . Warfarin Sodium Other (See Comments)    Pt had intracranial bleed, therefore all anticoagulation is contraindicated per Dr. Ron Parker    No current facility-administered medications on file prior to visit.   Current Outpatient Medications on File Prior to Visit  Medication Sig Dispense Refill  . aspirin EC 81 MG tablet Take 1 tablet (81 mg total) by mouth daily. 90 tablet 3  . atorvastatin (LIPITOR) 10 MG tablet Take 10 mg by mouth daily.    . Budeson-Glycopyrrol-Formoterol (BREZTRI AEROSPHERE) 160-9-4.8 MCG/ACT AERO Inhale 2 puffs into the lungs in the morning and at bedtime. 5.9 g 0  . budesonide-formoterol (SYMBICORT) 160-4.5 MCG/ACT inhaler INHALE 2 PUFFS INTO THE LUNGS TWICE A DAY (Patient taking differently: Inhale 2 puffs into the lungs in the morning and at bedtime. ) 10.2 Inhaler 6  . furosemide (LASIX) 40 MG tablet TAKE 1 TABLET BY MOUTH TWICE A DAY (Patient taking differently: Take 40 mg by mouth 2  (two) times daily. ) 180 tablet 3  . metFORMIN (GLUCOPHAGE) 1000 MG tablet TAKE 1 TABLET BY MOUTH TWICE A DAY WITH MEALS (Patient taking differently: Take 1,000 mg by mouth 2 (two) times daily with a meal. ) 180 tablet 0  . metoprolol succinate (TOPROL-XL)  25 MG 24 hr tablet Take 0.5 tablets (12.5 mg total) by mouth daily. 30 tablet 0  . nitroGLYCERIN (NITROSTAT) 0.4 MG SL tablet Place 0.4 mg under the tongue every 5 (five) minutes x 3 doses as needed for chest pain.     . OXYGEN Inhale 2-3 L/min into the lungs continuous.     . potassium chloride SA (KLOR-CON) 20 MEQ tablet Take 2 tablets (40 mEq total) by mouth daily. 180 tablet 1    BP 110/60   Pulse 73   Temp 98 F (36.7 C)   SpO2 (!) 88% Comment: 4 L via [place]      Objective:   Physical Exam Vitals and nursing note reviewed.  Constitutional:      Appearance: He is underweight. He is ill-appearing.  Cardiovascular:     Rate and Rhythm: Rhythm irregularly irregular.     Pulses: Normal pulses.     Heart sounds: No murmur. No friction rub. No gallop.   Pulmonary:     Effort: Pulmonary effort is normal.     Breath sounds: Normal breath sounds. Decreased air movement present.  Abdominal:     General: Abdomen is flat. Bowel sounds are normal.     Palpations: Abdomen is soft.  Musculoskeletal:     Right lower leg: 3+ Pitting Edema present.     Left lower leg: 3+ Pitting Edema present.  Skin:    General: Skin is warm and dry.  Neurological:     General: No focal deficit present.     Mental Status: He is alert and oriented to person, place, and time.  Psychiatric:        Mood and Affect: Mood normal.        Behavior: Behavior normal.        Thought Content: Thought content normal.        Judgment: Judgment normal.       Assessment & Plan:  Due to complaints of sudden onset lower extremity edema, lower abdominal pain, low back pain, generalized fatigue, and worsening shortness of breath, I am concerned for DVT/PE.  I feel  patient is in need of further evaluation in the emergency room and he is in agreement.  Will transport via EMS to [place], NP

## 2019-11-13 NOTE — H&P (Signed)
History and Physical    [name] UYQ:[id number] DOB: [birth year] DOA: 11/13/2019  PCP: [name], NP  Patient coming from: Home  I have personally briefly reviewed patient's old medical records in [place]  Chief Complaint: SOB  HPI: [name] is a 74 y.o. male with medical history significant of chronic hypoxemic respiratory failure on 4 L of home oxygen, systolic heart failure, history of CVA, ICH, HTN, HLD, T2 DM, lung cancer stage Ia, thoracic aortic aneurysm, permanent atrial fibrillation s/p ICD, LAA closure who presents with worsening shortness of breath and edema.  Patient reports he is not sure why but has noticed worsening shortness of breath and edema in the past week.  He states he has a very poor exercise tolerance at baseline but it has notably become worse.  Per chart review he was brought into his exam room via wheelchair due to weakness and shortness of breath.  Patient reports he has been taking all of his medications as prescribed.  He denies any chest pain.  He denies any palpitations.  He denies any fevers, chills.  He has not noticed any new orthopnea.  He denies any nausea/vomiting.  He was recently evaluated in the emergency room on April 29.  He underwent CTA at that time which did not reveal any PEs.  He did mention patient has mild endplate compression fractures as well as a questionable left atrial appendage thrombus though per chart he has had closure for this in the past.  Patient is a former smoker, denies any alcohol or drug use.  He lives at home with his wife.  He reports he is relatively independent in ADLs and denies using any equipment for ambulation.  Review of Systems: As per HPI otherwise 10 point review of systems negative.   Past Medical History:  Diagnosis Date  . Atrial septal defect    Closed with surgery January, 2010  . Automatic implantable cardioverter-defibrillator in situ    LV dysfunction and pacer needed for AV node  lesion  . Chronic combined systolic and diastolic CHF (congestive heart failure) ([place])   . Colon polyps   . COPD (chronic obstructive pulmonary disease) (HCC)    O2- 2 liters, nasal cannula, q night   . CVA (cerebral vascular accident) ([place]) 2009   denies residual on 08/14/2013  . Diabetes mellitus without complication ([place])    type 2  . Dyslipidemia   . Endocarditis    Bacterial, 2009  . Hypertension   . Intracranial hemorrhage (HCC)    Coumadin cannot be used because of the history of his bleed  . Lung cancer ([place]) 11/29/2013   T1N0 Stage Ia non-small cell carcinoma left lung treated with wedge resection  . Myocardial infarction ([place]) 2010  . Pacemaker    combo pacer and icd  . Permanent atrial fibrillation    Originally Coumadin use for atrial fibrillation  //   he had intracerebral hemorrhage with an INR of 2.3 June, 2009. Anticoagulation could no longer be used.  //  Rapid atrial fibrillation after inferior MI October, 2010..........Marland Kitchen AV node ablation done at that time with ICD pacemaker placed (EF 35%).   //   Left atrial appendage tied off at the time of mitral valve surgery January, 2010 (maze pro  . Pneumonia 07/2018  . Pulmonary hypertension (HCC)    Moderate  . Renal artery stenosis (HCC)    Mild by history  . 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 Spontaneous pneumothorax  right thoracotomy - distant past  . Status post minimally invasive mitral valve replacement with bioprosthetic valve    33 mm Medtronic Mosaic porcine bioprosthesis placed via right mini thoracotomy for bacterial endocarditis complicated by severe MR and CHF   . Thoracic aortic aneurysm ([place]) 08/11/2016   a - Chest CTA 1/18:  Aneurysmal dilatation of aortic root is noted at 5.1 cm.     Past Surgical History:  Procedure Laterality Date  . APPENDECTOMY    . ASD REPAIR, SECUNDUM  07/17/2008   pericardial patch closure of ASD  . AV NODE ABLATION  07/2008   for rapid atrial fib  . CARDIAC  CATHETERIZATION    . CARDIAC DEFIBRILLATOR PLACEMENT  ~ [address] [place]  . CARDIAC VALVE REPLACEMENT    . CATARACT EXTRACTION W/ INTRAOCULAR LENS  IMPLANT, BILATERAL Bilateral   . ESOPHAGOGASTRODUODENOSCOPY (EGD) WITH PROPOFOL N/A 09/29/2016   Procedure: ESOPHAGOGASTRODUODENOSCOPY (EGD) WITH PROPOFOL;  Surgeon: [name], MD;  Location: [facility] ENDOSCOPY;  Service: Endoscopy;  Laterality: N/A;  . HERNIA REPAIR    . IMPLANTABLE CARDIOVERTER DEFIBRILLATOR (ICD) GENERATOR CHANGE N/A 02/06/2012   Procedure: ICD GENERATOR CHANGE;  Surgeon: [name], MD;  Location: [facility] CATH LAB;  Service: Cardiovascular;  Laterality: N/A;  . INSERT / REPLACE / REMOVE PACEMAKER    . MASS BIOPSY Left    neck mass  . MITRAL VALVE REPLACEMENT Right 07/17/2008   61mm Medtronic Mosaic porcine bioprosthesis  . PENILE PROSTHESIS IMPLANT    . RIGHT HEART CATHETERIZATION N/A 08/16/2013   Procedure: RIGHT HEART CATH;  Surgeon: Josue Hector, MD;  Location: [facility] CATH LAB;  Service: Cardiovascular;  Laterality: N/A;  . TEE WITHOUT CARDIOVERSION N/A 09/06/2016   Procedure: TRANSESOPHAGEAL ECHOCARDIOGRAM (TEE);  Surgeon: Dorothy Spark, MD;  Location: [facility] ENDOSCOPY;  Service: Cardiovascular;  Laterality: N/A;  . THORACOTOMY Right 1970's   spontaneous pneumothorax - while in the [place]  . TOE SURGERY     left foot hammer toe  . TONSILLECTOMY    . VIDEO ASSISTED THORACOSCOPY (VATS)/WEDGE RESECTION Left 11/29/2013   Procedure: Video assisted thoracoscopy for wedge resection; mini thoracotomy;  Surgeon: [name], MD;  Location: [facility];  Service: Thoracic;  Laterality: Left;     reports that he quit smoking about 10 years ago. His smoking use included cigarettes. He has a 45.00 pack-year smoking history. He has never used smokeless tobacco. He reports that he does not drink alcohol or use drugs.  Allergies  Allergen Reactions  . Anticoagulant Compound Other (See Comments)    Pt had intracranial bleed, therefore all  anticoagulation is contraindicated per Dr. Ron Parker  . Other Other (See Comments)    Per Dr. Halford Chessman (Surgeon): stated that the patient cannot be put under for any surgery, as he has an enlarged aorta. He would stand only a 50/50 chance of surviving. He has lung issues, diminished lung tissue, COPD, and emphysema.  . Warfarin Sodium Other (See Comments)    Pt had intracranial bleed, therefore all anticoagulation is contraindicated per Dr. Ron Parker    Family History  Problem Relation Age of Onset  . Stomach cancer Father   . Stroke Mother      Prior to Admission medications   Medication Sig Start Date End Date Taking? Authorizing Provider  aspirin EC 81 MG tablet Take 1 tablet (81 mg total) by mouth daily. 12/07/12   Carlena Bjornstad, MD  atorvastatin (LIPITOR) 10 MG tablet Take 10 mg by mouth  daily.    [provider]  Budeson-Glycopyrrol-Formoterol (BREZTRI AEROSPHERE) 160-9-4.8 MCG/ACT AERO Inhale 2 puffs into the lungs in the morning and at bedtime. 11/08/19   Martyn Ehrich, NP  budesonide-formoterol (SYMBICORT) 160-4.5 MCG/ACT inhaler INHALE 2 PUFFS INTO THE LUNGS TWICE A DAY Patient taking differently: Inhale 2 puffs into the lungs in the morning and at bedtime.  05/22/19   Lauraine Rinne, NP  furosemide (LASIX) 40 MG tablet TAKE 1 TABLET BY MOUTH TWICE A DAY Patient taking differently: Take 40 mg by mouth 2 (two) times daily.  09/04/19   Nafziger, Tommi Rumps, NP  metFORMIN (GLUCOPHAGE) 1000 MG tablet TAKE 1 TABLET BY MOUTH TWICE A DAY WITH MEALS Patient taking differently: Take 1,000 mg by mouth 2 (two) times daily with a meal.  09/13/19   Nafziger, Tommi Rumps, NP  metoprolol succinate (TOPROL-XL) 25 MG 24 hr tablet Take 0.5 tablets (12.5 mg total) by mouth daily. 06/11/19   Regalado, Belkys A, MD  nitroGLYCERIN (NITROSTAT) 0.4 MG SL tablet Place 0.4 mg under the tongue every 5 (five) minutes x 3 doses as needed for chest pain.     [provider]  OXYGEN Inhale 2-3 L/min into the  lungs continuous.     [provider]  potassium chloride SA (KLOR-CON) 20 MEQ tablet Take 2 tablets (40 mEq total) by mouth daily. 08/12/19 11/10/19  [name], NP    Physical Exam: Vitals:   11/13/19 1500 11/13/19 1515 11/13/19 1600 11/13/19 1615  BP: 117/65 113/67    Pulse: 75 72 73 72  Resp:   (!) 24 17  Temp:      TempSrc:      SpO2: 94% 91% 100% 93%    Vitals:   11/13/19 1500 11/13/19 1515 11/13/19 1600 11/13/19 1615  BP: 117/65 113/67    Pulse: 75 72 73 72  Resp:   (!) 24 17  Temp:      TempSrc:      SpO2: 94% 91% 100% 93%     Constitutional: No acute distress but appears chronically ill Eyes: EOMI, anicteric sclera ENMT: Dry mucous membranes, atraumatic, hard of hearing Neck: normal, supple, no masses, no thyromegaly Respiratory: Wearing nasal cannula, bilateral crackles, no wheezing Cardiovascular: Regular rate and rhythm, no murmurs / rubs / gallops.  2+ bilateral lower extremity edema. 2+ pedal pulses. No carotid bruits.  Abdomen: no tenderness, no masses palpated. No hepatosplenomegaly. Bowel sounds positive.  Musculoskeletal: Normal tone, range of motion no deformity Skin: no rashes, lesions, ulcers. No induration Neurologic: CN 2-12 grossly intact.  No new gross motor or sensory deficits appreciated Psychiatric: Normal judgment and insight. Alert and oriented x 3. Normal mood.   Labs on Admission: I have personally reviewed following labs and imaging studies  CBC: Recent Labs  Lab 11/07/19 1450 11/13/19 1550  WBC 8.4 8.6  NEUTROABS  --  6.9  HGB 8.7* 9.3*  HCT 31.9* 34.3*  MCV 76.9* 77.3*  PLT 268 782   Basic Metabolic Panel: Recent Labs  Lab 11/07/19 1450 11/13/19 1550  NA 139 137  K 4.8 4.5  CL 104 105  CO2 19* 20*  GLUCOSE 209* 122*  BUN 27* 24*  CREATININE 1.12 0.98  CALCIUM 8.6* 8.2*   GFR: Estimated Creatinine Clearance: 69.3 mL/min (by C-G formula based on SCr of 0.98 mg/dL). Liver Function Tests: No results for  input(s): AST, ALT, ALKPHOS, BILITOT, PROT, ALBUMIN in the last 168 hours. No results for input(s): LIPASE, AMYLASE in the last 168  hours. No results for input(s): AMMONIA in the last 168 hours. Coagulation Profile: No results for input(s): INR, PROTIME in the last 168 hours. Cardiac Enzymes: No results for input(s): CKTOTAL, CKMB, CKMBINDEX, TROPONINI in the last 168 hours. BNP (last 3 results) No results for input(s): PROBNP in the last 8760 hours. HbA1C: No results for input(s): HGBA1C in the last 72 hours. CBG: No results for input(s): GLUCAP in the last 168 hours. Lipid Profile: No results for input(s): CHOL, HDL, LDLCALC, TRIG, CHOLHDL, LDLDIRECT in the last 72 hours. Thyroid Function Tests: No results for input(s): TSH, T4TOTAL, FREET4, T3FREE, THYROIDAB in the last 72 hours. Anemia Panel: No results for input(s): VITAMINB12, FOLATE, FERRITIN, TIBC, IRON, RETICCTPCT in the last 72 hours. Urine analysis:    Component Value Date/Time   COLORURINE YELLOW 06/07/2019 1231   APPEARANCEUR CLOUDY (A) 06/07/2019 1231   LABSPEC 1.013 06/07/2019 1231   PHURINE 5.0 06/07/2019 1231   GLUCOSEU 150 (A) 06/07/2019 1231   HGBUR SMALL (A) 06/07/2019 1231   HGBUR negative 04/05/2010 0837   BILIRUBINUR NEGATIVE 06/07/2019 1231   BILIRUBINUR n 03/27/2012 1323   KETONESUR NEGATIVE 06/07/2019 1231   PROTEINUR NEGATIVE 06/07/2019 1231   UROBILINOGEN 1.0 11/28/2013 1000   NITRITE NEGATIVE 06/07/2019 1231   LEUKOCYTESUR LARGE (A) 06/07/2019 1231    Radiological Exams on Admission: DG Chest 2 View  Result Date: 11/13/2019 CLINICAL DATA:  Shortness of breath. EXAM: CHEST - 2 VIEW COMPARISON:  November 07, 2019. FINDINGS: Stable cardiomegaly. Right-sided pacemaker is unchanged in position. No pneumothorax is noted. Stable bilateral perihilar and basilar lung opacities are noted concerning for possible edema or atypical inflammation with associated pleural effusions. Bony thorax is unremarkable.  IMPRESSION: Stable bilateral perihilar and basilar lung opacities are noted concerning for possible edema or atypical inflammation with associated pleural effusions. Electronically Signed   By: Marijo Conception M.D.   On: 11/13/2019 15:38   VAS [place] LOWER EXTREMITY VENOUS (DVT) (ONLY [facility] & [facility])  Result Date: 11/13/2019  Lower Venous DVTStudy Indications: Edema.  Performing Technologist: June Leap RDMS, RVT  Examination Guidelines: A complete evaluation includes B-mode imaging, spectral Doppler, color Doppler, and power Doppler as needed of all accessible portions of each vessel. Bilateral testing is considered an integral part of a complete examination. Limited examinations for reoccurring indications may be performed as noted. The reflux portion of the exam is performed with the patient in reverse Trendelenburg.  +---------+---------------+---------+-----------+----------+--------------+ RIGHT    CompressibilityPhasicitySpontaneityPropertiesThrombus Aging +---------+---------------+---------+-----------+----------+--------------+ CFV      Full           Yes      Yes                                 +---------+---------------+---------+-----------+----------+--------------+ SFJ      Full                                                        +---------+---------------+---------+-----------+----------+--------------+ FV Prox  Full                                                        +---------+---------------+---------+-----------+----------+--------------+  FV Mid   Full                                                        +---------+---------------+---------+-----------+----------+--------------+ FV DistalFull                                                        +---------+---------------+---------+-----------+----------+--------------+ PFV      Full                                                         +---------+---------------+---------+-----------+----------+--------------+ POP      Full           Yes      Yes                                 +---------+---------------+---------+-----------+----------+--------------+ PTV      Full                                                        +---------+---------------+---------+-----------+----------+--------------+ PERO     Full                                                        +---------+---------------+---------+-----------+----------+--------------+   +---------+---------------+---------+-----------+----------+--------------+ LEFT     CompressibilityPhasicitySpontaneityPropertiesThrombus Aging +---------+---------------+---------+-----------+----------+--------------+ CFV      Full           Yes      Yes                                 +---------+---------------+---------+-----------+----------+--------------+ SFJ      Full                                                        +---------+---------------+---------+-----------+----------+--------------+ FV Prox  Full                                                        +---------+---------------+---------+-----------+----------+--------------+ FV Mid   Full                                                        +---------+---------------+---------+-----------+----------+--------------+  FV DistalFull                                                        +---------+---------------+---------+-----------+----------+--------------+ PFV      Full                                                        +---------+---------------+---------+-----------+----------+--------------+ POP      Full           Yes      Yes                                 +---------+---------------+---------+-----------+----------+--------------+ PTV      Full                                                         +---------+---------------+---------+-----------+----------+--------------+ PERO     Full                                                        +---------+---------------+---------+-----------+----------+--------------+     Summary: RIGHT: - There is no evidence of deep vein thrombosis in the lower extremity.  - No cystic structure found in the popliteal fossa.  LEFT: - There is no evidence of deep vein thrombosis in the lower extremity.  - No cystic structure found in the popliteal fossa.  *See table(s) above for measurements and observations.    Preliminary     EKG: Independently reviewed.  Assessment/Plan [name] is a 74 y.o. male with medical history significant of chronic hypoxemic respiratory failure on 4 L of home oxygen, systolic heart failure, history of CVA, ICH, HTN, HLD, T2 DM, lung cancer stage Ia, thoracic aortic aneurysm, permanent atrial fibrillation s/p ICD, LAA closure who presents with worsening shortness of breath and edema concerning for acute CHF exacerbation  #Acute on chronic systolic CHF exacerbation/NICM #Pulmonary hypertension #Acute on chronic hypoxemic respiratory failure -Patient with oxygen saturations in the mid 80s on baseline 4 L.  His BNP is 1448 which is elevated from baseline.  His most recent echo is from November and does show depressed EF of 20 to 25%.  CTA one week prior did reveal signs of volume overload. -unclear but patient does not appear to be on GDMT without ACEi/ARB and has not been taking his BB recently (possibly due to BP tolerance or goals of care?) -for now will continue with diuresis with IV lasix 40 mg BID - venous dopplers were obtained in ER and negative for DVT -ordered Echo (also to eval LAA thrombus, which  -strict I/O, monitor daily weights -monitor K and Mg  # Acute on Chronic Hypoxemic resp failure # COPD Gold II # Pulmonary HTN - patient on baseline 4L of oxygen, suspect acute  requirement is d/t pulmonary edema and  should improve to baseline with diuresis - continue inhalers (reports not taking) - follows with pulmonology  # Permanent Atrial Fibrillation s/p AVN Ablation # Hx of MVR # LAA closure - patient's recent CTA made note of LAA thrombus, but had prior closure - he has not been on any anticoagulation due to hx of ICH - he reported he has not been taking metoprolol - has BiV ICD - follows with Dr. Marlou Porch, cardiology  # Microcytic Anemia - chronic, no reported blood loss - stable, monitor  # T2DM - ISS and CBG  # HLD - continue atorvastatin 10 mg  # Sinus Valsalva aneurysm - no plans for intervention - follows with Dr. Roxy Manns  # Hx of Non-small cell Lung Ca,Stage 1A - well differentiated adenocarcinoma - hx of wedge resection, no acute issues  # ?able BPH - patient noted to have enlarged prostate on recent imaging, but denies typical symptoms/ no hx of prostate cancer  DVT prophylaxis: SCDs/though anticipate can have prophylaxis (just not therapeutic AC if prolonged stay) Code Status: DNR/DNI; has followed with palliative care Disposition Plan: TBD Admission status: Cardiac Tele   Truddie Hidden MD [facility] Pager [phone]  If 7PM-7AM, please contact night-coverage [email or web address] Password [facility]  11/13/2019, 6:44 PM

## 2019-11-13 NOTE — ED Notes (Signed)
Pt's O2 was 95% at baseline while laying in bed. While walking, pt's O2 dropped to 88%. When sitting back down O2 went up to 98%.

## 2019-11-13 NOTE — ED Triage Notes (Signed)
Pt arrives to ED from his PCP office with complaints of worsening shortness of breath and leg swelling over the last two days. Per EMS patients O2 saturation at the office was 84% on 4L.

## 2019-11-14 ENCOUNTER — Encounter (HOSPITAL_COMMUNITY): Payer: Self-pay | Admitting: Internal Medicine

## 2019-11-14 ENCOUNTER — Inpatient Hospital Stay (HOSPITAL_COMMUNITY): Payer: Medicare Other

## 2019-11-14 DIAGNOSIS — I5021 Acute systolic (congestive) heart failure: Secondary | ICD-10-CM

## 2019-11-14 LAB — GLUCOSE, CAPILLARY
Glucose-Capillary: 159 mg/dL — ABNORMAL HIGH (ref 70–99)
Glucose-Capillary: 160 mg/dL — ABNORMAL HIGH (ref 70–99)

## 2019-11-14 LAB — CBC
HCT: 32.9 % — ABNORMAL LOW (ref 39.0–52.0)
Hemoglobin: 8.9 g/dL — ABNORMAL LOW (ref 13.0–17.0)
MCH: 20.6 pg — ABNORMAL LOW (ref 26.0–34.0)
MCHC: 27.1 g/dL — ABNORMAL LOW (ref 30.0–36.0)
MCV: 76.3 fL — ABNORMAL LOW (ref 80.0–100.0)
Platelets: 219 10*3/uL (ref 150–400)
RBC: 4.31 MIL/uL (ref 4.22–5.81)
RDW: 19.1 % — ABNORMAL HIGH (ref 11.5–15.5)
WBC: 8.3 10*3/uL (ref 4.0–10.5)
nRBC: 0.4 % — ABNORMAL HIGH (ref 0.0–0.2)

## 2019-11-14 LAB — CBG MONITORING, ED
Glucose-Capillary: 219 mg/dL — ABNORMAL HIGH (ref 70–99)
Glucose-Capillary: 96 mg/dL (ref 70–99)

## 2019-11-14 LAB — HEMOGLOBIN A1C
Hgb A1c MFr Bld: 7.4 % — ABNORMAL HIGH (ref 4.8–5.6)
Mean Plasma Glucose: 165.68 mg/dL

## 2019-11-14 LAB — ECHOCARDIOGRAM COMPLETE
Height: 73 in
Weight: 2545.6 oz

## 2019-11-14 MED ORDER — ORAL CARE MOUTH RINSE
15.0000 mL | Freq: Two times a day (BID) | OROMUCOSAL | Status: DC
  Administered 2019-11-14 – 2019-11-25 (×18): 15 mL via OROMUCOSAL

## 2019-11-14 MED ORDER — PERFLUTREN LIPID MICROSPHERE
1.0000 mL | INTRAVENOUS | Status: AC | PRN
  Administered 2019-11-14: 14:00:00 2 mL via INTRAVENOUS
  Filled 2019-11-14: qty 10

## 2019-11-14 NOTE — ED Notes (Signed)
Lunch Tray Ordered @ 1042. 

## 2019-11-14 NOTE — Plan of Care (Signed)
  Problem: Education: Goal: Ability to demonstrate management of disease process will improve Outcome: Progressing  Educated on Scd's and VTE   Problem: Activity: Goal: Capacity to carry out activities will improve Outcome: Progressing

## 2019-11-14 NOTE — Progress Notes (Signed)
PT Cancellation Note  Patient Details Name: [name] MRN: [id number] DOB: 1945/11/09   Cancelled Treatment:    Reason Eval/Treat Not Completed: Medical issues which prohibited therapy Pt reporting his legs are too swollen and that he is not going to walk today. Requesting to come back tomorrow. Will follow up as schedule allows.   Lou Miner, DPT  Acute Rehabilitation Services  Pager: [phone] Office: [phone]    Rudean Hitt 11/14/2019, 4:50 PM

## 2019-11-14 NOTE — Progress Notes (Signed)
[facility] Progress Note  Patient: [name]    ZOX:[id number]  DOA: 11/13/2019     Date of Service: the patient was seen and examined on 11/14/2019  Chief Complaint  Patient presents with  . Shortness of Breath   Brief hospital course: Past medical history of CVA, ICH, HTN, HLD, type II DM, stage I lung cancer, aortic aneurysm, A. fib, chronic systolic CHF SP ICD implant, LAA closure, chronic hypoxic respiratory failure on 4 LPM.  Presented with complaints of shortness of breath found to have acute on chronic systolic CHF Currently further plan is continue diuresis.  Assessment and Plan: 1.  Acute on chronic systolic CHF. Pulmonary hypertension. Acute on chronic hypoxic respiratory failure On baseline 4 L of oxygen. Requiring more oxygen right now. EF 20 to 25% in the past. Currently on IV Lasix 40 mg twice daily. Dopplers are negative for DVT. Echocardiogram currently pending. Daily weight and ins and out. Once improving will need to resume patient's beta-blocker which was held outpatient as well as add ACE ARB.  2.  History of COPD Baseline 4 LPM. Currently no evidence of exacerbation. Monitor. Outpatient follow-up with pulmonology.  3.  Permanent A. fib. SP AV ablation and ICD implant. History of MVR as well as LAA closure. Not on any anticoagulation due to history of ICH. Not taking any rate control medication for now. Monitor on telemetry.  4.  Chronic microcytic anemia. No acute blood loss reported. Monitor H&H.  5.  Type 2 diabetes mellitus, uncontrolled with hyperglycemia. Currently on insulin sliding scale insulin.  Monitor.  6.  Hyperlipidemia Continue Lipitor.  7.  History of non-small cell lung cancer. SP wedge resection. Monitor.  Diet: Cardiac diet DVT Prophylaxis: SCD, pharmacological prophylaxis contraindicated due to Anemia   Advance goals of care discussion: DNR  Family Communication: no family was present at bedside, at the time of  interview.   Disposition:  Status is: Inpatient  Remains inpatient appropriate because:IV treatments appropriate due to intensity of illness or inability to take PO and Inpatient level of care appropriate due to severity of illness   Dispo: The patient is from: Home              Anticipated d/c is to: SNF              Anticipated d/c date is: 2 days              Patient currently is not medically stable to d/c.  Subjective: Continues report shortness of breath no nausea no vomiting.  No chest pain or chest tightness.  No fever no chills.  No diarrhea.  No active bleeding reported.  Physical Exam: General:  alert oriented to time, place, and person.  Appear in moderate distress, affect appropriate Eyes: PERRL ENT: Oral Mucosa Clear, moist  Neck: no JVD,  Cardiovascular: S1 and S2 Present, no Murmur,  Respiratory: increased respiratory effort, Bilateral Air entry equal and Decreased, bilateral  Crackles, no wheezes Abdomen: Bowel Sound present, Soft and no tenderness,  Skin: no rash Extremities: bilateral  Pedal edema, no calf tenderness Neurologic: without any new focal findings Gait not checked due to patient safety concerns  Vitals:   11/14/19 1115 11/14/19 1130 11/14/19 1205 11/14/19 1252  BP: (!) 100/58 108/61 123/76 119/67  Pulse: 73 73 73 67  Resp:   20 18  Temp:   97.6 F (36.4 C) 97.6 F (36.4 C)  TempSrc:   Oral Oral  SpO2: 94% 97% 100%  92%  Weight:    72.2 kg  Height:    6\' 1"  (1.854 m)    Intake/Output Summary (Last 24 hours) at 11/14/2019 1902 Last data filed at 11/14/2019 1755 Gross per 24 hour  Intake 660 ml  Output 1050 ml  Net -390 ml   Filed Weights   11/14/19 1252  Weight: 72.2 kg    Data Reviewed: I have personally reviewed and interpreted daily labs, tele strips, imagings as discussed above. I reviewed all nursing notes, pharmacy notes, vitals, pertinent old records I have discussed plan of care as described above with RN and  patient/family.  CBC: Recent Labs  Lab 11/13/19 1550 11/14/19 0453  WBC 8.6 8.3  NEUTROABS 6.9  --   HGB 9.3* 8.9*  HCT 34.3* 32.9*  MCV 77.3* 76.3*  PLT 228 585   Basic Metabolic Panel: Recent Labs  Lab 11/13/19 1550  NA 137  K 4.5  CL 105  CO2 20*  GLUCOSE 122*  BUN 24*  CREATININE 0.98  CALCIUM 8.2*    Studies: ECHOCARDIOGRAM COMPLETE  Result Date: 11/14/2019    ECHOCARDIOGRAM REPORT   Patient Name:   [name] Date of Exam: 11/14/2019 Medical Rec #:  [id number]     Height:       73.0 in Accession #:    [id number]    Weight:       159.1 lb Date of Birth:  1945-07-26    BSA:          1.952 m Patient Age:    31 years      BP:           119/67 mmHg Patient Gender: M             HR:           67 bpm. Exam Location:  Inpatient Procedure: 2D Echo and Intracardiac Opacification Agent Indications:    CHF-Acute Systolic 086.76 / P95.09  History:        Patient has prior history of Echocardiogram examinations, most                 recent 06/07/2019. Cardiomyopathy, ICD (implantable                 cardioverter-defibrillator), S/P MVR (mitral valve replacement),                 COPD, Stroke and Pulmonary HTN; Risk Factors:Hypertension and                 Dyslipidemia. Elevated troponin.  Sonographer:    Vikki Ports Turrentine Referring Phys: [id number] Truddie Hidden  Sonographer Comments: Technically difficult study due to poor echo windows. IMPRESSIONS  1. LVEF is severely depressed with diffuse hypokinesis, worse in the septal and apical walls Compared to report from Nov 2020 echo, LVEF is worse (apical views foreshortened in 2020 exam which makes assessment difficult). Left ventricular ejection fraction, by estimation, is 25 to 30%. The left ventricle has severely decreased function. The left ventricle demonstrates global hypokinesis. There is mild left ventricular hypertrophy. Left ventricular diastolic parameters are indeterminate.  2. Right ventricular systolic function is moderately  reduced. The right ventricular size is severely enlarged. There is moderately elevated pulmonary artery systolic pressure.  3. Left atrial size was severely dilated.  4. Right atrial size was severely dilated.  5. A 33 mm Medtronic Mosai bioprosthesisiis present Peak and mean gradients through the vave are 15 and 6 mm Hg respectively, both lower than previous  echo report in 2020.Marland Kitchen The mitral valve has been repaired/replaced. Trivial mitral valve regurgitation.  6. The aortic valve is abnormal. Aortic valve regurgitation is not visualized. Mild to moderate aortic valve sclerosis/calcification is present, without any evidence of aortic stenosis.  7. Aortic root is 51 mm Previous echo measured echo above this; current exam did not image that high.. Aortic dilatation noted. Aneurysm of the aortic root. There is moderate to severe dilatation of the aortic root measuring 51 mm.  8. The inferior vena cava is dilated in size with <50% respiratory variability, suggesting right atrial pressure of 15 mmHg. FINDINGS  Left Ventricle: LVEF is severely depressed with diffuse hypokinesis, worse in the septal and apical walls Compared to report from Nov 2020 echo, LVEF is worse (apical views foreshortened in 2020 exam which makes assessment difficult). Left ventricular ejection fraction, by estimation, is 25 to 30%. The left ventricle has severely decreased function. The left ventricle demonstrates global hypokinesis. Definity contrast agent was given IV to delineate the left ventricular endocardial borders. The left ventricular internal cavity size was normal in size. There is mild left ventricular hypertrophy. Left ventricular diastolic parameters are indeterminate. Right Ventricle: The right ventricular size is severely enlarged. Right vetricular wall thickness was not assessed. Right ventricular systolic function is moderately reduced. There is moderately elevated pulmonary artery systolic pressure. The tricuspid regurgitant  velocity is 3.04 m/s, and with an assumed right atrial pressure of 10 mmHg, the estimated right ventricular systolic pressure is 70.2 mmHg. Left Atrium: Left atrial size was severely dilated. Right Atrium: Right atrial size was severely dilated. Pericardium: There is no evidence of pericardial effusion. Mitral Valve: A 33 mm Medtronic Mosai bioprosthesisiis present Peak and mean gradients through the vave are 15 and 6 mm Hg respectively, both lower than previous echo report in 2020. The mitral valve has been repaired/replaced. Trivial mitral valve regurgitation. MV peak gradient, 15.1 mmHg. The mean mitral valve gradient is 6.0 mmHg. Tricuspid Valve: The tricuspid valve is normal in structure. Tricuspid valve regurgitation is trivial. Aortic Valve: The aortic valve is abnormal. Aortic valve regurgitation is not visualized. Mild to moderate aortic valve sclerosis/calcification is present, without any evidence of aortic stenosis. Pulmonic Valve: The pulmonic valve was not well visualized. Pulmonic valve regurgitation is not visualized. Aorta: Aortic root is 51 mm Previous echo measured echo above this; current exam did not image that high. Aortic dilatation noted. There is moderate to severe dilatation of the aortic root measuring 51 mm. There is an aneurysm involving the aortic root. Venous: The inferior vena cava is dilated in size with less than 50% respiratory variability, suggesting right atrial pressure of 15 mmHg. IAS/Shunts: The interatrial septum was not assessed. Additional Comments: A pacer wire is visualized.  LEFT VENTRICLE PLAX 2D LVIDd:         5.10 cm LVIDs:         4.30 cm LV PW:         1.30 cm LV IVS:        1.30 cm LVOT diam:     2.30 cm LV SV:         50 LV SV Index:   26 LVOT Area:     4.15 cm  RIGHT VENTRICLE RV S prime:     4.61 cm/s TAPSE (M-mode): 0.6 cm LEFT ATRIUM            Index        RIGHT ATRIUM  Index LA diam:      6.50 cm  3.33 cm/m   RA Area:     30.00 cm LA Vol (A2C):  195.0 ml 99.88 ml/m  RA Volume:   97.60 ml  49.99 ml/m LA Vol (A4C): 257.0 ml 131.64 ml/m  AORTIC VALVE LVOT Vmax:   77.40 cm/s LVOT Vmean:  56.900 cm/s LVOT VTI:    0.120 m  AORTA Ao Root diam: 5.00 cm MITRAL VALVE             TRICUSPID VALVE MV Peak grad: 15.1 mmHg  TR Peak grad:   37.0 mmHg MV Mean grad: 6.0 mmHg   TR Vmax:        304.00 cm/s MV Vmax:      1.94 m/s MV Vmean:     108.0 cm/s SHUNTS                          Systemic VTI:  0.12 m                          Systemic Diam: 2.30 cm Dorris Carnes MD Electronically signed by Dorris Carnes MD Signature Date/Time: 11/14/2019/6:50:18 PM    Final     Scheduled Meds: . aspirin EC  81 mg Oral Daily  . atorvastatin  10 mg Oral Daily  . furosemide  40 mg Intravenous BID  . insulin aspart  0-5 Units Subcutaneous QHS  . insulin aspart  0-9 Units Subcutaneous TID WC  . mouth rinse  15 mL Mouth Rinse BID  . mometasone-formoterol  2 puff Inhalation BID   Continuous Infusions: PRN Meds: acetaminophen **OR** acetaminophen, albuterol, nitroGLYCERIN, senna-docusate  Time spent: 35 minutes  Author: Berle Mull, MD [facility] 11/14/2019 7:02 PM  To reach On-call, see care teams to locate the attending and reach out to them via [email or web address]. If 7PM-7AM, please contact night-coverage If you still have difficulty reaching the attending provider, please page the [facility] (Director on Call) for [facility] on amion for assistance.

## 2019-11-14 NOTE — Progress Notes (Signed)
  Echocardiogram 2D Echocardiogram has been performed.  [name] [name] 11/14/2019, 2:31 PM

## 2019-11-14 NOTE — Plan of Care (Signed)
  Problem: Education: Goal: Knowledge of General Education information will improve Description: Including pain rating scale, medication(s)/side effects and non-pharmacologic comfort measures Outcome: Progressing   Problem: Education: Goal: Ability to demonstrate management of disease process will improve Outcome: Progressing Goal: Ability to verbalize understanding of medication therapies will improve Outcome: Progressing Goal: Individualized Educational Video(s) Outcome: Progressing   Problem: Activity: Goal: Capacity to carry out activities will improve Outcome: Progressing   Problem: Cardiac: Goal: Ability to achieve and maintain adequate cardiopulmonary perfusion will improve Outcome: Progressing   

## 2019-11-15 ENCOUNTER — Inpatient Hospital Stay (HOSPITAL_COMMUNITY): Payer: Medicare Other

## 2019-11-15 LAB — CBC
HCT: 31.3 % — ABNORMAL LOW (ref 39.0–52.0)
Hemoglobin: 8.8 g/dL — ABNORMAL LOW (ref 13.0–17.0)
MCH: 20.9 pg — ABNORMAL LOW (ref 26.0–34.0)
MCHC: 28.1 g/dL — ABNORMAL LOW (ref 30.0–36.0)
MCV: 74.2 fL — ABNORMAL LOW (ref 80.0–100.0)
Platelets: 190 10*3/uL (ref 150–400)
RBC: 4.22 MIL/uL (ref 4.22–5.81)
RDW: 19.1 % — ABNORMAL HIGH (ref 11.5–15.5)
WBC: 7.3 10*3/uL (ref 4.0–10.5)
nRBC: 0.4 % — ABNORMAL HIGH (ref 0.0–0.2)

## 2019-11-15 LAB — BASIC METABOLIC PANEL
Anion gap: 7 (ref 5–15)
BUN: 18 mg/dL (ref 8–23)
CO2: 27 mmol/L (ref 22–32)
Calcium: 7.9 mg/dL — ABNORMAL LOW (ref 8.9–10.3)
Chloride: 100 mmol/L (ref 98–111)
Creatinine, Ser: 0.91 mg/dL (ref 0.61–1.24)
GFR calc Af Amer: >60 mL/min (ref >60)
GFR calc non Af Amer: >60 mL/min (ref >60)
Glucose, Bld: 125 mg/dL — ABNORMAL HIGH (ref 70–99)
Potassium: 3.5 mmol/L (ref 3.5–5.1)
Sodium: 134 mmol/L — ABNORMAL LOW (ref 135–145)

## 2019-11-15 LAB — TROPONIN I (HIGH SENSITIVITY)
Troponin I (High Sensitivity): 19 ng/L — ABNORMAL HIGH (ref <18)
Troponin I (High Sensitivity): 18 ng/L — ABNORMAL HIGH (ref <18)
Troponin I (High Sensitivity): 21 ng/L — ABNORMAL HIGH (ref <18)

## 2019-11-15 LAB — GLUCOSE, CAPILLARY
Glucose-Capillary: 139 mg/dL — ABNORMAL HIGH (ref 70–99)
Glucose-Capillary: 153 mg/dL — ABNORMAL HIGH (ref 70–99)
Glucose-Capillary: 149 mg/dL — ABNORMAL HIGH (ref 70–99)
Glucose-Capillary: 163 mg/dL — ABNORMAL HIGH (ref 70–99)

## 2019-11-15 LAB — D-DIMER, QUANTITATIVE: D-Dimer, Quant: 1.44 ug/mL-FEU — ABNORMAL HIGH (ref 0.00–0.50)

## 2019-11-15 MED ORDER — ISOSORBIDE MONONITRATE ER 30 MG PO TB24
15.0000 mg | ORAL_TABLET | Freq: Every day | ORAL | Status: DC
  Administered 2019-11-15 – 2019-11-19 (×5): 15 mg via ORAL
  Filled 2019-11-15 (×5): qty 1

## 2019-11-15 MED ORDER — POTASSIUM CHLORIDE CRYS ER 20 MEQ PO TBCR
40.0000 meq | EXTENDED_RELEASE_TABLET | Freq: Once | ORAL | Status: AC
  Administered 2019-11-15: 11:00:00 40 meq via ORAL
  Filled 2019-11-15: qty 2

## 2019-11-15 MED ORDER — ALUM & MAG HYDROXIDE-SIMETH 200-200-20 MG/5ML PO SUSP
30.0000 mL | Freq: Two times a day (BID) | ORAL | Status: DC
  Administered 2019-11-15: 30 mL via ORAL
  Filled 2019-11-15 (×18): qty 30

## 2019-11-15 MED ORDER — TRAMADOL HCL 50 MG PO TABS
50.0000 mg | ORAL_TABLET | Freq: Four times a day (QID) | ORAL | Status: DC | PRN
  Administered 2019-11-17 – 2019-11-19 (×8): 50 mg via ORAL
  Filled 2019-11-15 (×9): qty 1

## 2019-11-15 MED ORDER — MORPHINE SULFATE (PF) 2 MG/ML IV SOLN: 2.0000 mg | INTRAVENOUS | Status: DC | PRN

## 2019-11-15 MED ORDER — LIDOCAINE HCL URETHRAL/MUCOSAL 2 % EX PRSY
1.0000 "application " | PREFILLED_SYRINGE | Freq: Once | CUTANEOUS | Status: DC
  Filled 2019-11-15: qty 20

## 2019-11-15 MED ORDER — TAMSULOSIN HCL 0.4 MG PO CAPS
0.4000 mg | ORAL_CAPSULE | Freq: Every day | ORAL | Status: DC
  Administered 2019-11-15 – 2019-11-24 (×10): 0.4 mg via ORAL
  Filled 2019-11-15 (×10): qty 1

## 2019-11-15 NOTE — Progress Notes (Signed)
PT Cancellation Note  Patient Details Name: [name] MRN: [id number] DOB: 10-02-1945   Cancelled Treatment:    Reason Eval/Treat Not Completed: Patient declined, no reason specified Pt reports he is not walking because his legs are hurting and reports he has already walked. Despite max encouragement, pt continued to refuse. Will follow up as schedule allows.  Reuel Derby, PT, DPT  Acute Rehabilitation Services  Pager: [phone] Office: [phone]    Rudean Hitt 11/15/2019, 2:12 PM

## 2019-11-15 NOTE — Progress Notes (Signed)
[facility] Progress Note  Patient: [name]    UEA:[id number]  DOA: 11/13/2019     Date of Service: the patient was seen and examined on 11/15/2019  Chief Complaint  Patient presents with  . Shortness of Breath   Brief hospital course: Past medical history of CVA, ICH, HTN, HLD, type II DM, stage I lung cancer, aortic aneurysm, A. fib, chronic systolic CHF SP ICD implant, LAA closure, chronic hypoxic respiratory failure on 4 LPM.  Presented with complaints of shortness of breath found to have acute on chronic systolic CHF Currently further plan is continue diuresis.  Assessment and Plan: 1.  Acute on chronic systolic CHF. Pulmonary hypertension. Acute on chronic hypoxic respiratory failure On baseline 4 L of oxygen. Requiring more oxygen right now. EF 20 to 25% in the past. Currently on IV Lasix 40 mg twice daily. Dopplers are negative for DVT. Echocardiogram shows 25% EF. Daily weight and ins and out. Once improving will need to resume patient's beta-blocker which was held outpatient as well as add ACE ARB.  2.  History of COPD Baseline 4 LPM. Currently no evidence of exacerbation. Monitor. Outpatient follow-up with pulmonology.  3.  Permanent A. fib. SP AV ablation and ICD implant. History of MVR as well as LAA closure. Not on any anticoagulation due to history of ICH. Not taking any rate control medication for now. Monitor on telemetry.  4.  Chronic microcytic anemia. No acute blood loss reported. Monitor H&H.  5.  Type 2 diabetes mellitus, uncontrolled with hyperglycemia. Currently on insulin sliding scale insulin.  Monitor.  6.  Hyperlipidemia Continue Lipitor.  7.  History of non-small cell lung cancer. SP wedge resection. Monitor.  8.  Suprapubic tenderness. Urinary retention. Add Flomax. Suspect BPH. Currently able to urinate may require coud catheter.  9.  Chest pain. Troponin x3 not consistent with ACS. EKG  nonischemic. Monitor.  Diet: Cardiac diet DVT Prophylaxis: SCD, pharmacological prophylaxis contraindicated due to Anemia   Advance goals of care discussion: DNR  Family Communication: no family was present at bedside, at the time of interview.   Disposition:  Status is: Inpatient  Remains inpatient appropriate because:IV treatments appropriate due to intensity of illness or inability to take PO and Inpatient level of care appropriate due to severity of illness   Dispo: The patient is from: Home              Anticipated d/c is to: SNF              Anticipated d/c date is: 2 days              Patient currently is not medically stable to d/c.  Subjective: Continues to have shortness of breath.  Continues to have suprapubic tenderness.  Reported chest pain  Physical Exam: General:  alert oriented to time, place, and person.  Appear in moderate distress, affect appropriate Eyes: PERRL ENT: Oral Mucosa Clear, moist  Neck: no JVD,  Cardiovascular: S1 and S2 Present, no Murmur,  Respiratory: increased respiratory effort, Bilateral Air entry equal and Decreased, bilateral  Crackles, no wheezes Abdomen: Bowel Sound present, Soft and no tenderness,  Skin: no rash Extremities: bilateral  Pedal edema, no calf tenderness Neurologic: without any new focal findings Gait not checked due to patient safety concerns  Vitals:   11/15/19 0855 11/15/19 1205 11/15/19 1641 11/15/19 1641  BP: 108/68 103/71 102/67 102/67  Pulse: 77 71 78 78  Resp: 18 18    Temp: 97.7  F (36.5 C) (!) 100.4 F (38 C)    TempSrc: Oral Oral    SpO2: 94% 98% 95% 95%  Weight:      Height:        Intake/Output Summary (Last 24 hours) at 11/15/2019 1812 Last data filed at 11/15/2019 1800 Gross per 24 hour  Intake 1320 ml  Output 3075 ml  Net -1755 ml   Filed Weights   11/14/19 1252 11/15/19 0433  Weight: 72.2 kg 71.3 kg    Data Reviewed: I have personally reviewed and interpreted daily labs, tele strips,  imagings as discussed above. I reviewed all nursing notes, pharmacy notes, vitals, pertinent old records I have discussed plan of care as described above with RN and patient/family.  CBC: Recent Labs  Lab 11/13/19 1550 11/14/19 0453 11/15/19 0724  WBC 8.6 8.3 7.3  NEUTROABS 6.9  --   --   HGB 9.3* 8.9* 8.8*  HCT 34.3* 32.9* 31.3*  MCV 77.3* 76.3* 74.2*  PLT 228 219 676   Basic Metabolic Panel: Recent Labs  Lab 11/13/19 1550 11/15/19 0724  NA 137 134*  K 4.5 3.5  CL 105 100  CO2 20* 27  GLUCOSE 122* 125*  BUN 24* 18  CREATININE 0.98 0.91  CALCIUM 8.2* 7.9*    Studies: DG CHEST PORT 1 VIEW  Result Date: 11/15/2019 CLINICAL DATA:  Shortness of breath.  Chest pain. EXAM: PORTABLE CHEST 1 VIEW COMPARISON:  11/13/2019.  CT 11/07/2019. FINDINGS: AICD noted stable position. Cardiomegaly. Surgical sutures left lung. Stable changes of bullous COPD. Diffuse bilateral pulmonary infiltrates/edema. Small right pleural effusion. Stable elevation left hemidiaphragm. IMPRESSION: 1.  AICD noted in stable position. 2. Cardiomegaly. Diffuse bilateral pulmonary infiltrates/edema again noted. Small right pleural effusion. 3. Stable changes of bullous COPD. Stable elevation left hemidiaphragm. Electronically Signed   By: [place]   On: 11/15/2019 11:59    Scheduled Meds: . alum & mag hydroxide-simeth  30 mL Oral BID  . aspirin EC  81 mg Oral Daily  . atorvastatin  10 mg Oral Daily  . furosemide  40 mg Intravenous BID  . insulin aspart  0-5 Units Subcutaneous QHS  . insulin aspart  0-9 Units Subcutaneous TID WC  . isosorbide mononitrate  15 mg Oral Daily  . lidocaine  1 application Urethral Once  . mouth rinse  15 mL Mouth Rinse BID  . mometasone-formoterol  2 puff Inhalation BID  . tamsulosin  0.4 mg Oral QPC supper   Continuous Infusions: PRN Meds: acetaminophen **OR** acetaminophen, albuterol, morphine injection, nitroGLYCERIN, senna-docusate, traMADol  Time spent: 35  minutes  Author: Berle Mull, MD [facility] 11/15/2019 6:12 PM  To reach On-call, see care teams to locate the attending and reach out to them via [email or web address]. If 7PM-7AM, please contact night-coverage If you still have difficulty reaching the attending provider, please page the [facility] (Director on Call) for [facility] on amion for assistance.

## 2019-11-15 NOTE — Progress Notes (Signed)
Patient complains of difficulty urinating. Bladder scan showed a volume of 505. Unsuccessful in and out cath with charged RN at the bedside MD paged.

## 2019-11-15 NOTE — Evaluation (Signed)
Occupational Therapy Evaluation Patient Details Name: [name] MRN: [id number] DOB: 09-30-1945 Today's Date: 11/15/2019    History of Present Illness [name] is a 74 y.o. male with medical history significant of chronic hypoxemic respiratory failure on 4 L of home oxygen, systolic heart failure, history of CVA, ICH, HTN, HLD, T2 DM, lung cancer stage Ia, thoracic aortic aneurysm, permanent atrial fibrillation s/p ICD, LAA closure who presents with worsening shortness of breath and edema.   Clinical Impression   PTA pt reports he was independent with BADL/IADL and living with spouse. Pt wears 4L O2 at baseline. At time of eval, pt completed bed mobility at min guard level and sit <> stand with min A and RW. Pt completed household level of functional mobility with min A and RW. Pt with DOE 3/4, needing immediate rest break. SpO2 noted to be 85% once seated, increased to 5L where pt recovered after ~3 minutes to 93% and SpO2 placed back to 4L. Recommend HHOT at d/c to continue to progress BADL activity tolerance. Will continue to follow per POC listed below.     Follow Up Recommendations  Home health OT;Supervision/Assistance - 24 hour    Equipment Recommendations  3 in 1 bedside commode    Recommendations for Other Services       Precautions / Restrictions Precautions Precautions: Fall Precaution Comments: watch SpO2 Restrictions Weight Bearing Restrictions: No      Mobility Bed Mobility Overal bed mobility: Needs Assistance Bed Mobility: Supine to Sit     Supine to sit: Min guard     General bed mobility comments: increased time and effort  Transfers Overall transfer level: Needs assistance Equipment used: Rolling walker (2 wheeled) Transfers: Sit to/from Stand Sit to Stand: Min assist         General transfer comment: increased effort to rise into standing    Balance Overall balance assessment: Needs assistance Sitting-balance support: No upper  extremity supported;Feet supported Sitting balance-Leahy Scale: Good     Standing balance support: Bilateral upper extremity supported;During functional activity Standing balance-Leahy Scale: Poor Standing balance comment: reliant on external support                           ADL either performed or assessed with clinical judgement   ADL Overall ADL's : Needs assistance/impaired Eating/Feeding: Set up;Sitting   Grooming: Min guard;Minimal assistance;Standing Grooming Details (indicate cue type and reason): decreased activity tolerance for standing tasks Upper Body Bathing: Set up;Sitting   Lower Body Bathing: Minimal assistance;Sit to/from stand;Sitting/lateral leans   Upper Body Dressing : Set up;Sitting   Lower Body Dressing: Minimal assistance;Sit to/from stand;Sitting/lateral leans   Toilet Transfer: Minimal assistance;RW;Ambulation   Toileting- Clothing Manipulation and Hygiene: Minimal assistance;Sitting/lateral lean   Tub/ Shower Transfer: Minimal assistance;Ambulation;Shower seat;Rolling walker   Functional mobility during ADLs: Minimal assistance;Rolling walker       Vision Baseline Vision/History: Wears glasses Wears Glasses: At all times Patient Visual Report: No change from baseline       Perception     Praxis      Pertinent Vitals/Pain Pain Assessment: No/denies pain     Hand Dominance     Extremity/Trunk Assessment Upper Extremity Assessment Upper Extremity Assessment: Generalized weakness   Lower Extremity Assessment Lower Extremity Assessment: Generalized weakness(Bil LE edema)       Communication Communication Communication: HOH   Cognition Arousal/Alertness: Awake/alert Behavior During Therapy: WFL for tasks assessed/performed Overall Cognitive Status: Within Functional  Limits for tasks assessed                                     General Comments       Exercises     Shoulder Instructions      Home  Living Family/patient expects to be discharged to:: Private residence Living Arrangements: Spouse/significant other Available Help at Discharge: Family Type of Home: House Home Access: Stairs to enter Technical brewer of Steps: 3 Entrance Stairs-Rails: Can reach both Home Layout: One level     Bathroom Shower/Tub: Teacher, early years/pre: Standard Bathroom Accessibility: Yes   Home Equipment: None   Additional Comments: oxygen concentrator (3 liters at home)      Prior Functioning/Environment Level of Independence: Independent        Comments: states he is still driving, no DME usage        OT Problem List: Decreased strength;Decreased knowledge of use of DME or AE;Decreased activity tolerance;Cardiopulmonary status limiting activity;Increased edema;Impaired balance (sitting and/or standing)      OT Treatment/Interventions: Self-care/ADL training;Therapeutic exercise;Patient/family education;Balance training;Energy conservation;Therapeutic activities;DME and/or AE instruction    OT Goals(Current goals can be found in the care plan section) Acute Rehab OT Goals Patient Stated Goal: return home OT Goal Formulation: With patient Time For Goal Achievement: 11/29/19 Potential to Achieve Goals: Good  OT Frequency: Min 2X/week   Barriers to D/C:            Co-evaluation              AM-PAC OT "6 Clicks" Daily Activity     Outcome Measure Help from another person eating meals?: None Help from another person taking care of personal grooming?: A Little Help from another person toileting, which includes using toliet, bedpan, or urinal?: A Little Help from another person bathing (including washing, rinsing, drying)?: A Little Help from another person to put on and taking off regular upper body clothing?: None Help from another person to put on and taking off regular lower body clothing?: A Little 6 Click Score: 20   End of Session Equipment Utilized  During Treatment: Gait belt;Rolling walker Nurse Communication: Mobility status  Activity Tolerance: Patient tolerated treatment well Patient left: in bed;with call bell/phone within reach  OT Visit Diagnosis: Unsteadiness on feet (R26.81);Other abnormalities of gait and mobility (R26.89);Muscle weakness (generalized) (M62.81)                Time: 0939-1000 OT Time Calculation (min): 21 min Charges:  OT General Charges $OT Visit: 1 Visit OT Evaluation $OT Eval Moderate Complexity: [place], MSOT, OTR/L [place] [facility] Office Number: [phone] Pager: [phone]  Zenovia Jarred 11/15/2019, 12:53 PM

## 2019-11-16 ENCOUNTER — Inpatient Hospital Stay (HOSPITAL_COMMUNITY): Payer: Medicare Other

## 2019-11-16 LAB — CBC
HCT: 29.6 % — ABNORMAL LOW (ref 39.0–52.0)
Hemoglobin: 8.2 g/dL — ABNORMAL LOW (ref 13.0–17.0)
MCH: 20.7 pg — ABNORMAL LOW (ref 26.0–34.0)
MCHC: 27.7 g/dL — ABNORMAL LOW (ref 30.0–36.0)
MCV: 74.6 fL — ABNORMAL LOW (ref 80.0–100.0)
Platelets: 180 10*3/uL (ref 150–400)
RBC: 3.97 MIL/uL — ABNORMAL LOW (ref 4.22–5.81)
RDW: 18.7 % — ABNORMAL HIGH (ref 11.5–15.5)
WBC: 6.5 10*3/uL (ref 4.0–10.5)
nRBC: 0.3 % — ABNORMAL HIGH (ref 0.0–0.2)

## 2019-11-16 LAB — GLUCOSE, CAPILLARY
Glucose-Capillary: 153 mg/dL — ABNORMAL HIGH (ref 70–99)
Glucose-Capillary: 166 mg/dL — ABNORMAL HIGH (ref 70–99)
Glucose-Capillary: 149 mg/dL — ABNORMAL HIGH (ref 70–99)
Glucose-Capillary: 183 mg/dL — ABNORMAL HIGH (ref 70–99)

## 2019-11-16 LAB — BASIC METABOLIC PANEL
Anion gap: 7 (ref 5–15)
BUN: 16 mg/dL (ref 8–23)
CO2: 28 mmol/L (ref 22–32)
Calcium: 8 mg/dL — ABNORMAL LOW (ref 8.9–10.3)
Chloride: 100 mmol/L (ref 98–111)
Creatinine, Ser: 0.9 mg/dL (ref 0.61–1.24)
GFR calc Af Amer: >60 mL/min (ref >60)
GFR calc non Af Amer: >60 mL/min (ref >60)
Glucose, Bld: 120 mg/dL — ABNORMAL HIGH (ref 70–99)
Potassium: 3.6 mmol/L (ref 3.5–5.1)
Sodium: 135 mmol/L (ref 135–145)

## 2019-11-16 LAB — IRON AND TIBC
Iron: 12 ug/dL — ABNORMAL LOW (ref 45–182)
Saturation Ratios: 3 % — ABNORMAL LOW (ref 17.9–39.5)
TIBC: 371 ug/dL (ref 250–450)
UIBC: 359 ug/dL

## 2019-11-16 LAB — FOLATE: Folate: 13.1 ng/mL (ref >5.9)

## 2019-11-16 LAB — FERRITIN: Ferritin: 10 ng/mL — ABNORMAL LOW (ref 24–336)

## 2019-11-16 LAB — VITAMIN B12: Vitamin B-12: 247 pg/mL (ref 180–914)

## 2019-11-16 MED ORDER — FUROSEMIDE 10 MG/ML IJ SOLN
40.0000 mg | Freq: Every day | INTRAMUSCULAR | Status: DC
  Administered 2019-11-17 – 2019-11-19 (×3): 40 mg via INTRAVENOUS
  Filled 2019-11-16 (×3): qty 4

## 2019-11-16 MED ORDER — VITAMIN B-12 1000 MCG PO TABS
1000.0000 ug | ORAL_TABLET | Freq: Every day | ORAL | Status: DC
  Administered 2019-11-17 – 2019-11-25 (×9): 1000 ug via ORAL
  Filled 2019-11-16 (×9): qty 1

## 2019-11-16 MED ORDER — SODIUM CHLORIDE 0.9 % IV SOLN
510.0000 mg | Freq: Once | INTRAVENOUS | Status: AC
  Administered 2019-11-16: 13:00:00 510 mg via INTRAVENOUS
  Filled 2019-11-16: qty 17

## 2019-11-16 MED ORDER — CYANOCOBALAMIN 1000 MCG/ML IJ SOLN
1000.0000 ug | Freq: Once | INTRAMUSCULAR | Status: AC
  Administered 2019-11-16: 1000 ug via SUBCUTANEOUS
  Filled 2019-11-16: qty 1

## 2019-11-16 NOTE — Progress Notes (Signed)
[facility] Progress Note  Patient: [name]    UVO:[id number]  DOA: 11/13/2019     Date of Service: the patient was seen and examined on 11/16/2019  Chief Complaint  Patient presents with  . Shortness of Breath   Brief hospital course: Past medical history of CVA, ICH, HTN, HLD, type II DM, stage I lung cancer, aortic aneurysm, A. fib, chronic systolic CHF SP ICD implant, LAA closure, chronic hypoxic respiratory failure on 4 LPM.  Presented with complaints of shortness of breath found to have acute on chronic systolic CHF Currently further plan is continue diuresis.  Assessment and Plan: 1.  Acute on chronic systolic CHF. Pulmonary hypertension. Acute on chronic hypoxic respiratory failure On baseline 4 L of oxygen. Requiring more oxygen right now. EF 20 to 25% in the past. Currently on IV Lasix 40 mg daily. Dopplers are negative for DVT. Echocardiogram shows 25% EF. Daily weight and ins and out. Once improving will need to resume patient's beta-blocker which was held outpatient as well as add ACE ARB.  2.  History of COPD Baseline 4 LPM. Currently no evidence of exacerbation. Monitor. Outpatient follow-up with pulmonology.  3.  Permanent A. fib. SP AV ablation and ICD implant. History of MVR as well as LAA closure. Not on any anticoagulation due to history of ICH. Not taking any rate control medication for now. Monitor on telemetry.  4.  Chronic microcytic anemia. No acute blood loss reported. Monitor H&H.  5.  Type 2 diabetes mellitus, uncontrolled with hyperglycemia. Currently on insulin sliding scale insulin.  Monitor.  6.  Hyperlipidemia Continue Lipitor.  7.  History of non-small cell lung cancer. SP wedge resection. Monitor.  8.  Suprapubic tenderness. Acute urinary retention. Add Flomax. Suspect BPH. Currently able to urinate, but may require coud catheter.  9.  Chest pain. Troponin x3 not consistent with ACS. EKG  nonischemic. Monitor.  10.  Low back pain. Check x-ray of spine.  Diet: Cardiac diet DVT Prophylaxis: SCD, pharmacological prophylaxis contraindicated due to Anemia   Advance goals of care discussion: DNR  Family Communication: no family was present at bedside, at the time of interview.   Disposition:  Status is: Inpatient  Remains inpatient appropriate because:IV treatments appropriate due to intensity of illness or inability to take PO and Inpatient level of care appropriate due to severity of illness   Dispo: The patient is from: Home              Anticipated d/c is to: Home with home health              Anticipated d/c date is: 2 days              Patient currently is not medically stable to d/c.  Subjective: Reports more back pain.  As well as leg pain.  No nausea no vomiting.  No fever no chills.  No suprapubic pain.  No chest pain.  Physical Exam: General:  alert oriented to time, place, and person.  Appear in moderate distress, affect appropriate Eyes: PERRL ENT: Oral Mucosa Clear, moist  Neck: no JVD,  Cardiovascular: S1 and S2 Present, no Murmur,  Respiratory: increased respiratory effort, Bilateral Air entry equal and Decreased, bilateral  Crackles, no wheezes Abdomen: Bowel Sound present, Soft and no tenderness,  Skin: no rash Extremities: bilateral  Pedal edema, no calf tenderness Neurologic: without any new focal findings Gait not checked due to patient safety concerns  Vitals:   11/16/19 0739 11/16/19  0820 11/16/19 0821 11/16/19 1050  BP:  100/61 (!) 101/58 105/63  Pulse:  73 73 73  Resp:  16  18  Temp:  (!) 97.3 F (36.3 C)  (!) 97.3 F (36.3 C)  TempSrc:  Oral  Oral  SpO2: 98% 92% 92% 97%  Weight:      Height:        Intake/Output Summary (Last 24 hours) at 11/16/2019 1800 Last data filed at 11/16/2019 1539 Gross per 24 hour  Intake 2255.6 ml  Output 2201 ml  Net 54.6 ml   Filed Weights   11/14/19 1252 11/15/19 0433 11/16/19 0319  Weight:  72.2 kg 71.3 kg 71.3 kg    Data Reviewed: I have personally reviewed and interpreted daily labs, tele strips, imagings as discussed above. I reviewed all nursing notes, pharmacy notes, vitals, pertinent old records I have discussed plan of care as described above with RN and patient/family.  CBC: Recent Labs  Lab 11/13/19 1550 11/14/19 0453 11/15/19 0724 11/16/19 0818  WBC 8.6 8.3 7.3 6.5  NEUTROABS 6.9  --   --   --   HGB 9.3* 8.9* 8.8* 8.2*  HCT 34.3* 32.9* 31.3* 29.6*  MCV 77.3* 76.3* 74.2* 74.6*  PLT 228 219 190 646   Basic Metabolic Panel: Recent Labs  Lab 11/13/19 1550 11/15/19 0724 11/16/19 0818  NA 137 134* 135  K 4.5 3.5 3.6  CL 105 100 100  CO2 20* 27 28  GLUCOSE 122* 125* 120*  BUN 24* 18 16  CREATININE 0.98 0.91 0.90  CALCIUM 8.2* 7.9* 8.0*    Studies: No results found.  Scheduled Meds: . alum & mag hydroxide-simeth  30 mL Oral BID  . aspirin EC  81 mg Oral Daily  . atorvastatin  10 mg Oral Daily  . [START ON 11/17/2019] furosemide  40 mg Intravenous Daily  . insulin aspart  0-5 Units Subcutaneous QHS  . insulin aspart  0-9 Units Subcutaneous TID WC  . isosorbide mononitrate  15 mg Oral Daily  . lidocaine  1 application Urethral Once  . mouth rinse  15 mL Mouth Rinse BID  . mometasone-formoterol  2 puff Inhalation BID  . tamsulosin  0.4 mg Oral QPC supper  . [START ON 11/17/2019] vitamin B-12  1,000 mcg Oral Daily   Continuous Infusions: PRN Meds: acetaminophen **OR** acetaminophen, albuterol, morphine injection, nitroGLYCERIN, senna-docusate, traMADol  Time spent: 35 minutes  Author: Berle Mull, MD [facility] 11/16/2019 6:00 PM  To reach On-call, see care teams to locate the attending and reach out to them via [email or web address]. If 7PM-7AM, please contact night-coverage If you still have difficulty reaching the attending provider, please page the [facility] (Director on Call) for [facility] on amion for assistance.

## 2019-11-16 NOTE — Progress Notes (Signed)
Bladder scanned patient after report of lower abdominal pain. Obtained a value of 850mLs of urine retained. Educated patient on urinary retention. Patient refused coude catheter. Charge RN educated patient on coude catheterization and urinary retention. Patient refused coude catheterization again. MD notified. Will continue to monitor.

## 2019-11-16 NOTE — Progress Notes (Addendum)
Patient had a near syncope episode while leaving the bathroom. Patient says "my legs are locking" as I grabbed the patient. Transport tech and myself lowered patient to the floor and told the patient to deep breathe. Patient did not lose consciousness and is alert and oriented x3.Patient expresses no pain and there is no abrasions or skin tears on patient's knees/legs. Patient BP is 97/56 while sitting in the chair. Patient explains that he did strain while trying to have a bowel movement. MD paged and notified. Transport tech is currently with patient to radiology.

## 2019-11-16 NOTE — Progress Notes (Signed)
PT Cancellation Note  Patient Details Name: [name] MRN: [id number] DOB: Mar 08, 1946   Cancelled Treatment:    Reason Eval/Treat Not Completed: Patient declined, no reason specified. Pt refusing despite encouragement from PT and spouse. Pt stating that his legs were painful today and did not want to participate. PT will continue to f/u with pt acutely as available.    [place] 11/16/2019, 12:37 PM

## 2019-11-16 NOTE — Plan of Care (Signed)
  Problem: Education: Goal: Knowledge of General Education information will improve Description: Including pain rating scale, medication(s)/side effects and non-pharmacologic comfort measures Outcome: Progressing   Problem: Education: Goal: Ability to demonstrate management of disease process will improve Outcome: Progressing Goal: Ability to verbalize understanding of medication therapies will improve Outcome: Progressing Goal: Individualized Educational Video(s) Outcome: Progressing   Problem: Activity: Goal: Capacity to carry out activities will improve Outcome: Progressing   Problem: Cardiac: Goal: Ability to achieve and maintain adequate cardiopulmonary perfusion will improve Outcome: Progressing   

## 2019-11-17 ENCOUNTER — Inpatient Hospital Stay (HOSPITAL_COMMUNITY): Payer: Medicare Other

## 2019-11-17 LAB — CBC
HCT: 28.2 % — ABNORMAL LOW (ref 39.0–52.0)
Hemoglobin: 7.9 g/dL — ABNORMAL LOW (ref 13.0–17.0)
MCH: 20.7 pg — ABNORMAL LOW (ref 26.0–34.0)
MCHC: 28 g/dL — ABNORMAL LOW (ref 30.0–36.0)
MCV: 74 fL — ABNORMAL LOW (ref 80.0–100.0)
Platelets: 163 10*3/uL (ref 150–400)
RBC: 3.81 MIL/uL — ABNORMAL LOW (ref 4.22–5.81)
RDW: 18.7 % — ABNORMAL HIGH (ref 11.5–15.5)
WBC: 7.8 10*3/uL (ref 4.0–10.5)
nRBC: 0.4 % — ABNORMAL HIGH (ref 0.0–0.2)

## 2019-11-17 LAB — BASIC METABOLIC PANEL
Anion gap: 5 (ref 5–15)
BUN: 15 mg/dL (ref 8–23)
CO2: 27 mmol/L (ref 22–32)
Calcium: 8.1 mg/dL — ABNORMAL LOW (ref 8.9–10.3)
Chloride: 99 mmol/L (ref 98–111)
Creatinine, Ser: 0.95 mg/dL (ref 0.61–1.24)
GFR calc Af Amer: >60 mL/min (ref >60)
GFR calc non Af Amer: >60 mL/min (ref >60)
Glucose, Bld: 156 mg/dL — ABNORMAL HIGH (ref 70–99)
Potassium: 3.4 mmol/L — ABNORMAL LOW (ref 3.5–5.1)
Sodium: 131 mmol/L — ABNORMAL LOW (ref 135–145)

## 2019-11-17 LAB — GLUCOSE, CAPILLARY
Glucose-Capillary: 161 mg/dL — ABNORMAL HIGH (ref 70–99)
Glucose-Capillary: 197 mg/dL — ABNORMAL HIGH (ref 70–99)
Glucose-Capillary: 134 mg/dL — ABNORMAL HIGH (ref 70–99)
Glucose-Capillary: 192 mg/dL — ABNORMAL HIGH (ref 70–99)

## 2019-11-17 MED ORDER — ALBUMIN HUMAN 25 % IV SOLN
12.5000 g | Freq: Once | INTRAVENOUS | Status: AC
  Administered 2019-11-17: 12.5 g via INTRAVENOUS
  Filled 2019-11-17: qty 50

## 2019-11-17 MED ORDER — POTASSIUM CHLORIDE CRYS ER 20 MEQ PO TBCR
40.0000 meq | EXTENDED_RELEASE_TABLET | Freq: Once | ORAL | Status: AC
  Administered 2019-11-17: 11:00:00 40 meq via ORAL
  Filled 2019-11-17: qty 2

## 2019-11-17 MED ORDER — METHOCARBAMOL 500 MG PO TABS
500.0000 mg | ORAL_TABLET | Freq: Three times a day (TID) | ORAL | Status: DC
  Administered 2019-11-17 – 2019-11-20 (×10): 500 mg via ORAL
  Filled 2019-11-17 (×10): qty 1

## 2019-11-17 NOTE — Evaluation (Signed)
Physical Therapy Evaluation & Discharge Patient Details Name: [name] MRN: [id number] DOB: 11-15-1945 Today's Date: 11/17/2019   History of Present Illness  [name] is a 74 y.o. male with PMH of chronic hypoxemic respiratory failure on 4 L of home oxygen, systolic heart failure, CVA, ICH, HTN, HLD, T2 DM, lung cancer stage Ia, thoracic aortic aneurysm, permanent atrial fibrillation s/p ICD, who presents 11/13/19 with worsening SOB and edema. Workup for CHF exacerbation.    Clinical Impression  Patient evaluated by Physical Therapy with no further acute PT needs identified. PTA, pt independent, drives and lives with family. Today, pt performing limited mobility at supervision-level, declined ambulation beyond EOB secondary to fatigue. Pt with drop in BP upon standing (see values below), reports asymptomatic. SpO2 85-91% on 4L O2 [name] with activity. Pt would benefit from continued acute PT services to maximize functional mobility and independence, but pt declining continued acute PT services and requests to be taken off caseload. Therefore, acute PT is signing off. Thank you for this referral.  Orthostatic BPs Sitting 103/63, HR 75  Standing 90/50, HR 73  Standing after 2 min 96/48, HR 80      Follow Up Recommendations Home health PT;Supervision for mobility/OOB(pt declined follow-up)    Equipment Recommendations  None recommended by PT(pt declined)    Recommendations for Other Services       Precautions / Restrictions Precautions Precautions: Fall;Other (comment) Precaution Comments: Watch SpO2 and BP Restrictions Weight Bearing Restrictions: No      Mobility  Bed Mobility               General bed mobility comments: Received sitting EOB  Transfers Overall transfer level: Needs assistance Equipment used: None Transfers: Sit to/from Stand Sit to Stand: Supervision         General transfer comment: No physical assist provided, pt reaching to table for UE  support upon rising; SpO2 down to 86% on 4L  Ambulation/Gait Ambulation/Gait assistance: Supervision   Assistive device: None       General Gait Details: Declined ambulation, able to march in place initially reaching for UE support, but able to repeat without UE support; supervision for safety  Stairs            Wheelchair Mobility    Modified Rankin (Stroke Patients Only)       Balance Overall balance assessment: Needs assistance Sitting-balance support: No upper extremity supported;Feet supported;Feet unsupported Sitting balance-Leahy Scale: Good     Standing balance support: No upper extremity supported;During functional activity Standing balance-Leahy Scale: Fair Standing balance comment: Prolonged static standing without UE support for orthostatic BP measurements; able to march in place without UE support                             Pertinent Vitals/Pain Pain Assessment: Faces Faces Pain Scale: Hurts even more Pain Location: L pectoralis area with LUE ROM (reports since controlled lower to fall after pre-syncopal episode yesterday) Pain Descriptors / Indicators: Discomfort;Grimacing Pain Intervention(s): Monitored during session;Limited activity within patient's tolerance    Home Living Family/patient expects to be discharged to:: Private residence Living Arrangements: Spouse/significant other Available Help at Discharge: Family Type of Home: House Home Access: Stairs to enter Entrance Stairs-Rails: Can reach both Entrance Stairs-Number of Steps: 3 Home Layout: One level Home Equipment: None Additional Comments: Wears 4L O2 at home    Prior Function Level of Independence: Independent  Comments: Independent, 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)     Hand Dominance        Extremity/Trunk Assessment   Upper Extremity Assessment Upper Extremity Assessment: LUE deficits/detail LUE Deficits / Details: L shoulder flex/abd  limited by c/o pain in pectoralis region LUE: Unable to fully assess due to pain    Lower Extremity Assessment Lower Extremity Assessment: Generalized weakness       Communication   Communication: HOH  Cognition Arousal/Alertness: Awake/alert Behavior During Therapy: WFL for tasks assessed/performed Overall Cognitive Status: Within Functional Limits for tasks assessed                                 General Comments: WFL for simple tasks, not formally assessed      General Comments General comments (skin integrity, edema, etc.): SpO2 85-91% on 4L O2 [name], pt able to state need for deep breathing but required cues to do so. BP down to 90/50 with standing, pt asymptomatic    Exercises     Assessment/Plan    PT Assessment Patent does not need any further PT services(declined PT, request to be taken off caseload)  PT Problem List         PT Treatment Interventions      PT Goals (Current goals can be found in the Care Plan section)  Acute Rehab PT Goals Patient Stated Goal: Return home; not interested in staying on acute PT caseload or any PT follow-up PT Goal Formulation: All assessment and education complete, DC therapy    Frequency     Barriers to discharge        Co-evaluation               AM-PAC PT "6 Clicks" Mobility  Outcome Measure Help needed turning from your back to your side while in a flat bed without using bedrails?: None Help needed moving from lying on your back to sitting on the side of a flat bed without using bedrails?: None Help needed moving to and from a bed to a chair (including a wheelchair)?: None Help needed standing up from a chair using your arms (e.g., wheelchair or bedside chair)?: None Help needed to walk in hospital room?: A Little Help needed climbing 3-5 steps with a railing? : A Little 6 Click Score: 22    End of Session Equipment Utilized During Treatment: Oxygen Activity Tolerance: Patient tolerated  treatment well;Patient limited by fatigue Patient left: in bed;with call bell/phone within reach;with bed alarm set Nurse Communication: Mobility status PT Visit Diagnosis: Other abnormalities of gait and mobility (R26.89)    Time: 9407-6808 PT Time Calculation (min) (ACUTE ONLY): 13 min   Charges:   PT Evaluation $PT Eval Moderate Complexity: 1 Mod     Mabeline Caras, PT, DPT Acute Rehabilitation Services  Pager [phone] Office [place] 11/17/2019, 10:00 AM

## 2019-11-17 NOTE — Plan of Care (Signed)
  Problem: Education: Goal: Knowledge of General Education information will improve Description: Including pain rating scale, medication(s)/side effects and non-pharmacologic comfort measures Outcome: Progressing   Problem: Education: Goal: Ability to demonstrate management of disease process will improve Outcome: Progressing   

## 2019-11-17 NOTE — Progress Notes (Signed)
[facility] Progress Note  Patient: [name]    VVO:[id number]  DOA: 11/13/2019     Date of Service: the patient was seen and examined on 11/17/2019  Chief Complaint  Patient presents with  . Shortness of Breath   Brief hospital course: Past medical history of CVA, ICH, HTN, HLD, type II DM, stage I lung cancer, aortic aneurysm, A. fib, chronic systolic CHF SP ICD implant, LAA closure, chronic hypoxic respiratory failure on 4 LPM.  Presented with complaints of shortness of breath found to have acute on chronic systolic CHF Currently further plan is continue diuresis.  Assessment and Plan: 1.  Acute on chronic systolic CHF. Pulmonary hypertension. Acute on chronic hypoxic respiratory failure On baseline 4 L of oxygen. Requiring more oxygen right now. EF 20 to 25% in the past. Currently on IV Lasix 40 mg daily. Dopplers are negative for DVT. Echocardiogram shows 25% EF. Daily weight and ins and out. Once improving will need to resume patient's beta-blocker which was held outpatient as well as add ACE ARB. Repeat chest x-ray on 11/17/2019 shows moderate interstitial pulmonary edema.  2.  History of COPD Baseline 4 LPM. Currently no evidence of exacerbation. Monitor. Outpatient follow-up with pulmonology.  3.  Permanent A. fib. SP AV ablation and ICD implant. History of bioprosthetic MVR as well as LAA closure. Not on any anticoagulation due to history of ICH. Not taking any rate control medication for now. Monitor on telemetry.  4.  Chronic microcytic anemia. No acute blood loss reported. Monitor H&H.  5.  Type 2 diabetes mellitus, uncontrolled with hyperglycemia. Currently on insulin sliding scale insulin.  Monitor.  6.  Hyperlipidemia Continue Lipitor.  7.  History of non-small cell lung cancer. SP wedge resection. Monitor.  8.  Suprapubic tenderness. Acute urinary retention. Add Flomax. Suspect BPH. Currently able to urinate, but may require coud  catheter.  9.  Chest pain. Troponin x3 not consistent with ACS. EKG nonischemic. Monitor.  10.  Low back pain. X-ray lumbar spine shows evidence of arthritis.  No acute fracture.  11.  Left shoulder pain. Patient reported pain after being repositioned in the bed after chest x-ray. X-ray chest on 11/17/2019 shows evidence of no acute osseous abnormality. Robaxin added.  12.  Near syncope. On 11/16/2019 patient had an episode of near syncope. Likely in the setting of vasovagal response while he was trying to strain for bowel movement as well as being on Flomax. Monitor on telemetry.  Diet: Cardiac diet DVT Prophylaxis: SCD, pharmacological prophylaxis contraindicated due to Anemia   Advance goals of care discussion: DNR  Family Communication: no family was present at bedside, at the time of interview.   Disposition:  Status is: Inpatient  Remains inpatient appropriate because:IV treatments appropriate due to intensity of illness or inability to take PO and Inpatient level of care appropriate due to severity of illness   Dispo: The patient is from: Home              Anticipated d/c is to: Home with home health              Anticipated d/c date is: 2 days              Patient currently is not medically stable to d/c.  Subjective: Continues to report back pain as well as shoulder pain.  No nausea no vomiting no fever no chills.  Had a near syncopal event last night.  Physical Exam: General:  alert oriented to  time, place, and person.  Appear in moderate distress, affect appropriate Eyes: PERRL ENT: Oral Mucosa Clear, moist  Neck: no JVD,  Cardiovascular: S1 and S2 Present, no Murmur,  Respiratory: increased respiratory effort, Bilateral Air entry equal and Decreased, persistent bilateral crackles, no wheezes Abdomen: Bowel Sound present, Soft and no tenderness,  Skin: no rash Extremities: Improving pedal edema, no calf tenderness Neurologic: without any new focal  findings Gait not checked due to patient safety concerns  Vitals:   11/17/19 0524 11/17/19 0746 11/17/19 1043 11/17/19 1207  BP: 104/65  (!) 107/56 108/62  Pulse: 78  75 74  Resp: 18   16  Temp: (!) 97.5 F (36.4 C)   98.2 F (36.8 C)  TempSrc: Oral   Oral  SpO2: (!) 87% 90%  93%  Weight: 72.2 kg     Height:        Intake/Output Summary (Last 24 hours) at 11/17/2019 1251 Last data filed at 11/17/2019 0948 Gross per 24 hour  Intake 1915.6 ml  Output 1075 ml  Net 840.6 ml   Filed Weights   11/15/19 0433 11/16/19 0319 11/17/19 0524  Weight: 71.3 kg 71.3 kg 72.2 kg    Data Reviewed: I have personally reviewed and interpreted daily labs, tele strips, imagings as discussed above. I reviewed all nursing notes, pharmacy notes, vitals, pertinent old records I have discussed plan of care as described above with RN and patient/family.  CBC: Recent Labs  Lab 11/13/19 1550 11/14/19 0453 11/15/19 0724 11/16/19 0818 11/17/19 0432  WBC 8.6 8.3 7.3 6.5 7.8  NEUTROABS 6.9  --   --   --   --   HGB 9.3* 8.9* 8.8* 8.2* 7.9*  HCT 34.3* 32.9* 31.3* 29.6* 28.2*  MCV 77.3* 76.3* 74.2* 74.6* 74.0*  PLT 228 219 190 180 093   Basic Metabolic Panel: Recent Labs  Lab 11/13/19 1550 11/15/19 0724 11/16/19 0818 11/17/19 0432  NA 137 134* 135 131*  K 4.5 3.5 3.6 3.4*  CL 105 100 100 99  CO2 20* 27 28 27   GLUCOSE 122* 125* 120* 156*  BUN 24* 18 16 15   CREATININE 0.98 0.91 0.90 0.95  CALCIUM 8.2* 7.9* 8.0* 8.1*    Studies: DG Lumbar Spine 2-3 Views  Result Date: 11/16/2019 CLINICAL DATA:  Lower back pain. EXAM: LUMBAR SPINE - 2-3 VIEW COMPARISON:  None. FINDINGS: There is no evidence of an acute lumbar spine fracture. Chronic loss of vertebral body height is seen at the levels of T12 and L1. Approximately 2 mm retrolisthesis of the L3 vertebral body is noted on L4. Approximately 2 mm anterolisthesis of the L4 vertebral body is seen on L5. There is moderate severity multilevel endplate  sclerosis with moderate severity multilevel intervertebral disc space narrowing. Marked severity calcification of the abdominal aorta and bilateral common iliac arteries is seen. IMPRESSION: 1. Chronic loss of vertebral body height at T12 and L1. 2. Moderate severity multilevel degenerative disc disease. 3. Marked severity atherosclerotic calcification of the abdominal aorta and bilateral common iliac arteries. Electronically Signed   By: Virgina Norfolk M.D.   On: 11/16/2019 19:13   DG Chest Port 1 View  Result Date: 11/17/2019 CLINICAL DATA:  Shortness of breath. EXAM: PORTABLE CHEST 1 VIEW COMPARISON:  Nov 15, 2019 FINDINGS: Right-sided cardiac pacemaker/defibrillator is in stable position. The cardiac silhouette is stably enlarged. Moderately increased interstitial markings. Small bilateral pleural effusions. Bullous emphysematous changes in the upper lobes again noted. Osseous structures are without acute abnormality. Soft tissues  are grossly normal. IMPRESSION: 1. Stably enlarged cardiac silhouette. 2. Moderate interstitial pulmonary edema with small bilateral pleural effusions. Electronically Signed   By: Fidela Salisbury M.D.   On: 11/17/2019 09:28    Scheduled Meds: . alum & mag hydroxide-simeth  30 mL Oral BID  . aspirin EC  81 mg Oral Daily  . atorvastatin  10 mg Oral Daily  . furosemide  40 mg Intravenous Daily  . insulin aspart  0-5 Units Subcutaneous QHS  . insulin aspart  0-9 Units Subcutaneous TID WC  . isosorbide mononitrate  15 mg Oral Daily  . lidocaine  1 application Urethral Once  . mouth rinse  15 mL Mouth Rinse BID  . methocarbamol  500 mg Oral TID  . mometasone-formoterol  2 puff Inhalation BID  . tamsulosin  0.4 mg Oral QPC supper  . vitamin B-12  1,000 mcg Oral Daily   Continuous Infusions: PRN Meds: acetaminophen **OR** acetaminophen, albuterol, morphine injection, nitroGLYCERIN, senna-docusate, traMADol  Time spent: 35 minutes  Author: Berle Mull, MD [facility] 11/17/2019 12:51 PM  To reach On-call, see care teams to locate the attending and reach out to them via [email or web address]. If 7PM-7AM, please contact night-coverage If you still have difficulty reaching the attending provider, please page the [facility] (Director on Call) for [facility] on amion for assistance.

## 2019-11-18 ENCOUNTER — Inpatient Hospital Stay (HOSPITAL_COMMUNITY): Payer: Medicare Other

## 2019-11-18 LAB — BASIC METABOLIC PANEL
Anion gap: 9 (ref 5–15)
BUN: 14 mg/dL (ref 8–23)
CO2: 26 mmol/L (ref 22–32)
Calcium: 8.3 mg/dL — ABNORMAL LOW (ref 8.9–10.3)
Chloride: 99 mmol/L (ref 98–111)
Creatinine, Ser: 1.04 mg/dL (ref 0.61–1.24)
GFR calc Af Amer: >60 mL/min (ref >60)
GFR calc non Af Amer: >60 mL/min (ref >60)
Glucose, Bld: 140 mg/dL — ABNORMAL HIGH (ref 70–99)
Potassium: 3.8 mmol/L (ref 3.5–5.1)
Sodium: 134 mmol/L — ABNORMAL LOW (ref 135–145)

## 2019-11-18 LAB — CBC
HCT: 29.8 % — ABNORMAL LOW (ref 39.0–52.0)
Hemoglobin: 8.2 g/dL — ABNORMAL LOW (ref 13.0–17.0)
MCH: 20.8 pg — ABNORMAL LOW (ref 26.0–34.0)
MCHC: 27.5 g/dL — ABNORMAL LOW (ref 30.0–36.0)
MCV: 75.4 fL — ABNORMAL LOW (ref 80.0–100.0)
Platelets: 179 10*3/uL (ref 150–400)
RBC: 3.95 MIL/uL — ABNORMAL LOW (ref 4.22–5.81)
RDW: 19.1 % — ABNORMAL HIGH (ref 11.5–15.5)
WBC: 6.6 10*3/uL (ref 4.0–10.5)
nRBC: 0.5 % — ABNORMAL HIGH (ref 0.0–0.2)

## 2019-11-18 LAB — GLUCOSE, CAPILLARY
Glucose-Capillary: 155 mg/dL — ABNORMAL HIGH (ref 70–99)
Glucose-Capillary: 152 mg/dL — ABNORMAL HIGH (ref 70–99)
Glucose-Capillary: 112 mg/dL — ABNORMAL HIGH (ref 70–99)
Glucose-Capillary: 207 mg/dL — ABNORMAL HIGH (ref 70–99)

## 2019-11-18 NOTE — Progress Notes (Signed)
[facility] Progress Note  Patient: [name]    NLG:[id number]  DOA: 11/13/2019     Date of Service: the patient was seen and examined on 11/18/2019  Chief Complaint  Patient presents with  . Shortness of Breath   Brief hospital course: Past medical history of CVA, ICH, HTN, HLD, type II DM, stage I lung cancer, aortic aneurysm, A. fib, chronic systolic CHF SP ICD implant, LAA closure, chronic hypoxic respiratory failure on 4 LPM.  Presented with complaints of shortness of breath found to have acute on chronic systolic CHF Currently further plan is continue diuresis.  Assessment and Plan: 1.  Acute on chronic systolic CHF. Pulmonary hypertension. Acute on chronic hypoxic respiratory failure On baseline 4 L of oxygen. Requiring more oxygen right now. EF 20 to 25% in the past. Currently on IV Lasix 40 mg.  Also received albumin Dopplers are negative for DVT. Echocardiogram shows 25% EF. Daily weight and ins and out. Once improving will need to resume patient's beta-blocker which was held outpatient as well as add ACE ARB. Repeat chest x-ray on 11/17/2019 shows moderate interstitial pulmonary edema. add Unna boots  2.  History of COPD Baseline 4 LPM. Currently no evidence of exacerbation. Monitor. Outpatient follow-up with pulmonology.  3.  Permanent A. fib. SP AV ablation and ICD implant. History of bioprosthetic MVR as well as LAA closure. Not on any anticoagulation due to history of ICH. Not taking any rate control medication for now. Monitor on telemetry.  4.  Chronic microcytic anemia. No acute blood loss reported. Monitor H&H.  5.  Type 2 diabetes mellitus, uncontrolled with hyperglycemia. Currently on insulin sliding scale insulin.  Monitor.  6.  Hyperlipidemia Continue Lipitor.  7.  History of non-small cell lung cancer. SP wedge resection. Monitor.  8.  Suprapubic tenderness. Acute urinary retention. Add Flomax. Suspect BPH. Currently able to  urinate, but may require coud catheter.  9.  Chest pain. Troponin x3 not consistent with ACS. EKG nonischemic. Monitor.  10.  Low back pain. X-ray lumbar spine shows evidence of arthritis.  No acute fracture.  11.  Left shoulder pain. Patient reported pain after being repositioned in the bed after chest x-ray. X-ray chest on 11/17/2019 shows evidence of no acute osseous abnormality. Robaxin added. X-ray clavicle negative. X-ray shoulder negative.  12.  Near syncope. On 11/16/2019 patient had an episode of near syncope. Likely in the setting of vasovagal response while he was trying to strain for bowel movement as well as being on Flomax. Monitor on telemetry.  Diet: Cardiac diet DVT Prophylaxis: SCD, pharmacological prophylaxis contraindicated due to Anemia   Advance goals of care discussion: DNR  Family Communication: no family was present at bedside, at the time of interview.   Disposition:  Status is: Inpatient  Remains inpatient appropriate because:IV treatments appropriate due to intensity of illness or inability to take PO and Inpatient level of care appropriate due to severity of illness   Dispo: The patient is from: Home              Anticipated d/c is to: Home with home health              Anticipated d/c date is: 2 days              Patient currently is not medically stable to d/c.  Subjective: Continues to have shortness of breath.  Required sutures of oxygen early this morning.  No nausea no vomiting.  No fever no  chills.  Continues to report left shoulder pain.  Physical Exam: General:  alert oriented to time, place, and person.  Appear in moderate distress, affect appropriate Eyes: PERRL ENT: Oral Mucosa Clear, moist  Neck: no JVD,  Cardiovascular: S1 and S2 Present, no Murmur,  Respiratory: increased respiratory effort, Bilateral Air entry equal and Decreased, persistent bilateral crackles, no wheezes Abdomen: Bowel Sound present, Soft and no tenderness,   Skin: no rash Extremities: Improving pedal edema, no calf tenderness Neurologic: without any new focal findings Gait not checked due to patient safety concerns  Vitals:   11/18/19 0917 11/18/19 1223 11/18/19 1622 11/18/19 2021  BP: (!) 106/54 104/63 106/64 107/66  Pulse: 72 74 73 74  Resp: 19 16 17 19   Temp: 97.8 F (36.6 C) 98.5 F (36.9 C) 98.9 F (37.2 C) 98.1 F (36.7 C)  TempSrc: Oral Oral Oral Oral  SpO2: 92% (!) 87% 93% 90%  Weight:      Height:        Intake/Output Summary (Last 24 hours) at 11/18/2019 2024 Last data filed at 11/18/2019 1700 Gross per 24 hour  Intake 1080 ml  Output 625 ml  Net 455 ml   Filed Weights   11/16/19 0319 11/17/19 0524 11/18/19 0437  Weight: 71.3 kg 72.2 kg 72.7 kg    Data Reviewed: I have personally reviewed and interpreted daily labs, tele strips, imagings as discussed above. I reviewed all nursing notes, pharmacy notes, vitals, pertinent old records I have discussed plan of care as described above with RN and patient/family.  CBC: Recent Labs  Lab 11/13/19 1550 11/13/19 1550 11/14/19 0453 11/15/19 0724 11/16/19 0818 11/17/19 0432 11/18/19 0548  WBC 8.6   < > 8.3 7.3 6.5 7.8 6.6  NEUTROABS 6.9  --   --   --   --   --   --   HGB 9.3*   < > 8.9* 8.8* 8.2* 7.9* 8.2*  HCT 34.3*   < > 32.9* 31.3* 29.6* 28.2* 29.8*  MCV 77.3*   < > 76.3* 74.2* 74.6* 74.0* 75.4*  PLT 228   < > 219 190 180 163 179   < > = values in this interval not displayed.   Basic Metabolic Panel: Recent Labs  Lab 11/13/19 1550 11/15/19 0724 11/16/19 0818 11/17/19 0432 11/18/19 0548  NA 137 134* 135 131* 134*  K 4.5 3.5 3.6 3.4* 3.8  CL 105 100 100 99 99  CO2 20* 27 28 27 26   GLUCOSE 122* 125* 120* 156* 140*  BUN 24* 18 16 15 14   CREATININE 0.98 0.91 0.90 0.95 1.04  CALCIUM 8.2* 7.9* 8.0* 8.1* 8.3*    Studies: DG Clavicle Left  Result Date: 11/18/2019 CLINICAL DATA:  Left shoulder pain EXAM: LEFT CLAVICLE - 2+ VIEWS COMPARISON:  None.  FINDINGS: Negative for fracture AC degenerative change in spurring. Narrowing of the acromial humeral distance compatible with rotator cuff disease. Mild degenerative change in the shoulder joint. Staples in the left upper lobe. IMPRESSION: Rotator cuff disease.  Negative for fracture. Electronically Signed   By: Franchot Gallo M.D.   On: 11/18/2019 12:25   DG Shoulder Left  Result Date: 11/18/2019 CLINICAL DATA:  Left shoulder pain EXAM: LEFT SHOULDER - 2+ VIEW COMPARISON:  None. FINDINGS: Negative for fracture or dislocation. Degenerative change in spurring in the [facility] joint. Irregularity of the greater tuberosity compatible with rotator cuff disease. Narrowing of the acromial humeral distance compatible with rotator cuff disease. Left upper lobe lobectomy clips are  present. IMPRESSION: Degenerative change in the left shoulder with rotator cuff disease. No acute fracture. Electronically Signed   By: Franchot Gallo M.D.   On: 11/18/2019 12:24    Scheduled Meds: . alum & mag hydroxide-simeth  30 mL Oral BID  . aspirin EC  81 mg Oral Daily  . atorvastatin  10 mg Oral Daily  . furosemide  40 mg Intravenous Daily  . insulin aspart  0-5 Units Subcutaneous QHS  . insulin aspart  0-9 Units Subcutaneous TID WC  . isosorbide mononitrate  15 mg Oral Daily  . lidocaine  1 application Urethral Once  . mouth rinse  15 mL Mouth Rinse BID  . methocarbamol  500 mg Oral TID  . mometasone-formoterol  2 puff Inhalation BID  . tamsulosin  0.4 mg Oral QPC supper  . vitamin B-12  1,000 mcg Oral Daily   Continuous Infusions: PRN Meds: acetaminophen **OR** acetaminophen, albuterol, morphine injection, nitroGLYCERIN, senna-docusate, traMADol  Time spent: 35 minutes  Author: Berle Mull, MD [facility] 11/18/2019 8:24 PM  To reach On-call, see care teams to locate the attending and reach out to them via [email or web address]. If 7PM-7AM, please contact night-coverage If you still have difficulty reaching the  attending provider, please page the [facility] (Director on Call) for [facility] on amion for assistance.

## 2019-11-18 NOTE — Progress Notes (Signed)
Occupational Therapy Treatment Patient Details Name: [name] MRN: [id number] DOB: 08-Oct-1945 Today's Date: 11/18/2019    History of present illness [name] is a 74 y.o. male with PMH of chronic hypoxemic respiratory failure on 4 L of home oxygen, systolic heart failure, CVA, ICH, HTN, HLD, T2 DM, lung cancer stage Ia, thoracic aortic aneurysm, permanent atrial fibrillation s/p ICD, who presents 11/13/19 with worsening SOB and edema. Workup for CHF exacerbation.   OT comments  Pt progressing with EOB LB ADL with figure 4 technique and energy conservation education performed.  Pt able to state 2 energy conservation techniques to use at home to use shower chair and use of RW when standing. Pt supervisionA for mobility in room a short household distance total. Pt c/o soreness in back, L shoulder pain, decreased strength, pt with decreased ability to care for self and poor activity tolerance. Pt easily irritable and not wanting additional self care tasks to complete. OT to continue to follow up for additional ADL tasks. OT following acutely.  Pt SpO2>90% after 2 minute rest break with 7L O2; pt requiring 6L O2 at rest today- sitting EOB. RN aware of O2 needs. BP stable at EOB.    Follow Up Recommendations  Home health OT;Supervision/Assistance - 24 hour    Equipment Recommendations  3 in 1 bedside commode    Recommendations for Other Services      Precautions / Restrictions Precautions Precautions: Fall;Other (comment) Precaution Comments: Watch SpO2 and BP Restrictions Weight Bearing Restrictions: No       Mobility Bed Mobility Overal bed mobility: Needs Assistance Bed Mobility: Supine to Sit           General bed mobility comments: Received sitting EOB  Transfers Overall transfer level: Needs assistance Equipment used: None Transfers: Sit to/from [facility] Sit to Stand: Supervision Stand pivot transfers: Min guard       General transfer  comment: [email or web address] to avoid plopping    Balance Overall balance assessment: Needs assistance Sitting-balance support: No upper extremity supported;Feet supported;Feet unsupported Sitting balance-Leahy Scale: Good     Standing balance support: No upper extremity supported;During functional activity Standing balance-Leahy Scale: Fair Standing balance comment: No dizziness reported; pt static standing momentarily before sitting EOB.                           ADL either performed or assessed with clinical judgement   ADL Overall ADL's : Needs assistance/impaired     Grooming: Set up;Sitting Grooming Details (indicate cue type and reason): Pt simulation of washing face/             Lower Body Dressing: Supervision/safety;Sitting/lateral leans Lower Body Dressing Details (indicate cue type and reason): sitting EOB performing by figure 4 technique Toilet Transfer: Minimal assistance;Ambulation Toilet Transfer Details (indicate cue type and reason): Assist to avoid plopping         Functional mobility during ADLs: Min guard;Cueing for safety General ADL Comments: Pt c/o soreness in back, L shoulder pain, decreased strength, pt with decreased ability to care for self and poor activity tolerance.     Vision   Vision Assessment?: No apparent visual deficits   Perception     Praxis      Cognition Arousal/Alertness: Awake/alert Behavior During Therapy: WFL for tasks assessed/performed Overall Cognitive Status: Within Functional Limits for tasks assessed  General Comments: Pt easily irritated this session. Pt agreeable to therapy, but slamming table away from him, dropping phone and then briefly raising voice about PLB techniques stating "I AM BREATHING LIKE THAT!"        Exercises Exercises: Other exercises   Shoulder Instructions       General Comments SpO2>90% after 2 minute rest break with 7L O2; pt requiring  6L O2 at rest today- sitting EOB.    Pertinent Vitals/ Pain       Pain Assessment: Faces Faces Pain Scale: Hurts little more Pain Location: L pectoralis area with LUE ROM (reports since controlled lower to fall after pre-syncopal episode yesterday) Pain Descriptors / Indicators: Discomfort;Grimacing Pain Intervention(s): Monitored during session  Home Living                                          Prior Functioning/Environment              Frequency  Min 2X/week        Progress Toward Goals  OT Goals(current goals can now be found in the care plan section)  Progress towards OT goals: Progressing toward goals  Acute Rehab OT Goals Patient Stated Goal: return home OT Goal Formulation: With patient Time For Goal Achievement: 11/29/19 Potential to Achieve Goals: Good ADL Goals Pt Will Perform Lower Body Bathing: with modified independence;sitting/lateral leans;sit to/from stand Pt Will Perform Lower Body Dressing: with modified independence;sitting/lateral leans;sit to/from stand Pt Will Transfer to Toilet: with modified independence;ambulating;regular height toilet;grab bars Additional ADL Goal #1: Pt will self monitor and maintain SpO2 sats above 88% with BADL activity Additional ADL Goal #2: Pt will recall and apply 3-5 ECS strategies to BADL activity  Plan Discharge plan remains appropriate    Co-evaluation                 AM-PAC OT "6 Clicks" Daily Activity     Outcome Measure   Help from another person eating meals?: None Help from another person taking care of personal grooming?: A Little Help from another person toileting, which includes using toliet, bedpan, or urinal?: A Little Help from another person bathing (including washing, rinsing, drying)?: A Little Help from another person to put on and taking off regular upper body clothing?: None Help from another person to put on and taking off regular lower body clothing?: A Little 6  Click Score: 20    End of Session    OT Visit Diagnosis: Unsteadiness on feet (R26.81);Other abnormalities of gait and mobility (R26.89);Muscle weakness (generalized) (M62.81)   Activity Tolerance Patient tolerated treatment well   Patient Left in bed;with call bell/phone within reach   Nurse Communication Mobility status        Time: 0940-7680 OT Time Calculation (min): 17 min  Charges: OT General Charges $OT Visit: 1 Visit OT Treatments $Self Care/Home Management : 8-22 mins  Jefferey Pica, OTR/L Acute Rehabilitation Services Pager: [phone] Office: [phone]  Nalee Lightle C 11/18/2019, 1:54 PM

## 2019-11-18 NOTE — Plan of Care (Signed)
  Problem: Education: Goal: Knowledge of General Education information will improve Description: Including pain rating scale, medication(s)/side effects and non-pharmacologic comfort measures Outcome: Progressing   Problem: Education: Goal: Ability to demonstrate management of disease process will improve Outcome: Progressing   Problem: Activity: Goal: Capacity to carry out activities will improve Outcome: Progressing

## 2019-11-19 LAB — BASIC METABOLIC PANEL
Anion gap: 13 (ref 5–15)
BUN: 18 mg/dL (ref 8–23)
CO2: 24 mmol/L (ref 22–32)
Calcium: 8.4 mg/dL — ABNORMAL LOW (ref 8.9–10.3)
Chloride: 95 mmol/L — ABNORMAL LOW (ref 98–111)
Creatinine, Ser: 0.87 mg/dL (ref 0.61–1.24)
GFR calc Af Amer: >60 mL/min (ref >60)
GFR calc non Af Amer: >60 mL/min (ref >60)
Glucose, Bld: 131 mg/dL — ABNORMAL HIGH (ref 70–99)
Potassium: 3.8 mmol/L (ref 3.5–5.1)
Sodium: 132 mmol/L — ABNORMAL LOW (ref 135–145)

## 2019-11-19 LAB — CBC
HCT: 30.7 % — ABNORMAL LOW (ref 39.0–52.0)
Hemoglobin: 8.6 g/dL — ABNORMAL LOW (ref 13.0–17.0)
MCH: 21.2 pg — ABNORMAL LOW (ref 26.0–34.0)
MCHC: 28 g/dL — ABNORMAL LOW (ref 30.0–36.0)
MCV: 75.6 fL — ABNORMAL LOW (ref 80.0–100.0)
Platelets: 184 10*3/uL (ref 150–400)
RBC: 4.06 MIL/uL — ABNORMAL LOW (ref 4.22–5.81)
RDW: 19.7 % — ABNORMAL HIGH (ref 11.5–15.5)
WBC: 6.8 10*3/uL (ref 4.0–10.5)
nRBC: 0.3 % — ABNORMAL HIGH (ref 0.0–0.2)

## 2019-11-19 LAB — GLUCOSE, CAPILLARY
Glucose-Capillary: 122 mg/dL — ABNORMAL HIGH (ref 70–99)
Glucose-Capillary: 177 mg/dL — ABNORMAL HIGH (ref 70–99)
Glucose-Capillary: 259 mg/dL — ABNORMAL HIGH (ref 70–99)
Glucose-Capillary: 203 mg/dL — ABNORMAL HIGH (ref 70–99)

## 2019-11-19 MED ORDER — FUROSEMIDE 10 MG/ML IJ SOLN
40.0000 mg | Freq: Four times a day (QID) | INTRAMUSCULAR | Status: AC
  Administered 2019-11-19 (×3): 40 mg via INTRAVENOUS
  Filled 2019-11-19 (×3): qty 4

## 2019-11-19 MED ORDER — FUROSEMIDE 40 MG PO TABS
40.0000 mg | ORAL_TABLET | Freq: Two times a day (BID) | ORAL | Status: DC
  Administered 2019-11-20: 40 mg via ORAL
  Filled 2019-11-19: qty 1

## 2019-11-19 NOTE — Progress Notes (Signed)
Heart Failure Stewardship Pharmacist Progress Note   PCP: [name], NP PCP-Cardiologist: Candee Furbish, MD    HPI:  74 yo M with PMH significant of chronic hypoxemic respiratory failure on 4 L of home oxygen, systolic heart failure, history of CVA, ICH, HTN, HLD, T2 DM, lung cancer stage Ia, thoracic aortic aneurysm, permanent atrial fibrillation s/p ICD, LAA closure who presented to ED from PCP office on 5/5 with worsening shortness of breath and edema.  ECHO on 11/14/19 show LVEF of 25-30% with worsening diffuse hypokinesis.   Patient has been receiving IV diuresis throughout admission.   Current HF Medications: Furosemide 40 mg IV q6h x 3 doses, then 40 mg PO BID  Prior to admission HF Medications: Furosemide 40 mg daily Metoprolol succinate - not taking   Pertinent Lab Values: . Serum creatinine 0.87, BUN 18, Potassium 3.8, Sodium 132, BNP 1448.6  Vital Signs: . Weight: 162 lb (estimated dry weight: ~155 lbs) . Blood pressure: 100/60s  . Heart rate: 70s  Medication Assistance / Insurance Benefits Check: Does the patient have prescription insurance?   yes Type of insurance plan: Medicare + [place]  Does the patient qualify for medication assistance through manufacturers or grants?   Pending household income information  Eligible grants and/or patient assistance programs: pending  Medication assistance applications in progress: none  Medication assistance applications approved: none   Approved medication assistance renewals will be completed by: Dr. Marlou Porch office  Outpatient Pharmacy:  [facility] Is the patient willing to use [place] at discharge?  TBD Is the patient willing to transition their outpatient pharmacy to utilize a [facility] outpatient pharmacy?  TBD    Assessment: 1. Chronic systolic CHF (EF 12-75%). NYHA class II/III symptoms.  - Continue furosemide IV q6h x 3 doses, then  reduce to 40 mg PO BID as above - Restart metoprolol succinate when able - Consider low dose ACE/ARB - limited in the past due to orthostatic hypotension - Consider adding spironolactone    Plan: 1) Medication changes recommended at this time: - Add spironolactone 12.5 mg daily, increase to 25 mg daily as able pending BP tolerability  2) Patient assistance application(s): - None at this time  3)  Education  - To be completed prior to discharge  Vertis Kelch, PharmD, BCPS Heart Failure Stewardship Pharmacist Phone [phone] 11/19/2019       2:00 PM

## 2019-11-19 NOTE — Care Management Important Message (Signed)
Important Message  Patient Details  Name: RASHARD RYLE MRN: [id number] Date of Birth: 09/30/1945   Medicare Important Message Given:  Yes     Shelda Altes 11/19/2019, 9:04 AM

## 2019-11-19 NOTE — TOC Initial Note (Signed)
Transition of Care [facility] - Initial/Assessment Note    Patient Details  Name: [name] MRN: [id number] Date of Birth: 04/06/1946  Transition of [facility]) CM/SW Contact:    Zenon Mayo, RN Phone Number: 11/19/2019, 9:01 AM  Clinical Narrative:                 NCM spoke with patient, offered to set up HHPT  And DME for patient , he is declining both, NCM asked if could call wife , he said yes but she is not going to change his mind.  NCM contacted wife, she states it is no need to set up [facility] services because he will not work with them, it will be a waste of their time.  [facility] [address]  He will have transport at dc.   Expected Discharge Plan: Home/Self Care Barriers to Discharge: Continued Medical Work up   Patient Goals and CMS Choice Patient states their goals for this hospitalization and ongoing recovery are:: get better CMS Medicare.gov Compare Post Acute Care list provided to:: Patient Choice offered to / list presented to : Patient  Expected Discharge Plan and Services Expected Discharge Plan: Home/Self Care   Discharge Planning Services: CM Consult   Living arrangements for the past 2 months: Single Family Home                   DME Agency: NA       HH Arranged: PT, Refused HH          Prior Living Arrangements/Services Living arrangements for the past 2 months: Single Family Home Lives with:: Spouse Patient language and need for interpreter reviewed:: Yes Do you feel safe going back to the place where you live?: Yes      Need for Family Participation in Patient Care: Yes (Comment) Care giver support system in place?: Yes (comment)   Criminal Activity/Legal Involvement Pertinent to Current Situation/Hospitalization: No - Comment as needed  Activities of Daily Living Home Assistive Devices/Equipment: Eyeglasses, CBG Meter ADL Screening (condition at time of admission) Patient's cognitive ability adequate to safely complete daily  activities?: Yes Is the patient deaf or have difficulty hearing?: Yes Does the patient have difficulty seeing, even when wearing glasses/contacts?: No Does the patient have difficulty concentrating, remembering, or making decisions?: No Patient able to express need for assistance with ADLs?: Yes Does the patient have difficulty dressing or bathing?: No Independently performs ADLs?: Yes (appropriate for developmental age) Does the patient have difficulty walking or climbing stairs?: No Weakness of Legs: None Weakness of Arms/Hands: None  Permission Sought/Granted                  Emotional Assessment Appearance:: Appears stated age Attitude/Demeanor/Rapport: Guarded Affect (typically observed): Guarded Orientation: : Oriented to Self, Oriented to Place, Oriented to  Time, Oriented to Situation   Psych Involvement: No (comment)  Admission diagnosis:  Acute exacerbation of CHF (congestive heart failure) (HCC) [I50.9] Acute respiratory failure with hypoxia ([place]) [J96.01] Acute on chronic congestive heart failure, unspecified heart failure type ([place]) [I50.9] Patient Active Problem List   Diagnosis Date Noted  . Acute exacerbation of CHF (congestive heart failure) ([place]) 11/13/2019  . Elevated troponin   . Chronic atrial fibrillation ([place])   . Weakness generalized   . DNR (do not resuscitate)   . Acute on chronic combined systolic (congestive) and diastolic (congestive) heart failure ([place]) 04/27/2019  . COPD with acute exacerbation ([place]) 08/01/2018  . Pulmonary  edema 07/30/2018  . Health care maintenance 05/09/2018  . Gastritis and gastroduodenitis   . Diverticulum of duodenum   . Aortic root dilatation ([place])   . 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 Atherosclerosis of aorta ([place]) 08/11/2016  . Orthostatic hypotension 09/26/2015  . Near syncope 09/24/2015  . Syncope 09/24/2015  . Hepatic cirrhosis ([place]) 09/25/2014  . History of colonic polyps 09/25/2014  . Serum total  bilirubin elevated 06/30/2014  . Follow-up examination, following unspecified surgery 01/27/2014  . Lung cancer ([place]) 11/29/2013  . Prosthetic valve dysfunction   . Chronic combined systolic and diastolic CHF (congestive heart failure) ([place])   . Diabetes ([place]) 11/04/2013  . Myocardial infarction ([place]) 11/01/2013  . Cardiomyopathy, nonischemic ([place]) 11/01/2013  . Acute on chronic respiratory failure with hypoxia ([place]) 08/15/2013  . H/O intracranial hemorrhage 08/13/2013  . H/O endocarditis 08/13/2013  . H/O: CVA (cerebrovascular accident) 08/13/2013  . Community acquired pneumonia 08/13/2013  . Chronic respiratory failure assoc with chf/ PAH 08/12/2013  . H/O atrioventricular nodal ablation   . S/P MVR (mitral valve replacement)   . Hypertension   . Dyslipidemia   . Permanent atrial fibrillation   . Ejection fraction < 50%   . ICD (implantable cardioverter-defibrillator), biventricular, in situ   . Renal artery stenosis ([place])   . Pulmonary hypertension ([place])   . Primary cancer of left upper lobe of lung ([place]) 04/20/2009  . COLONIC POLYPS, ADENOMATOUS 03/22/2007  . COPD GOLD II 01/11/2007  . GERD 01/11/2007   PCP:  [name], NP [id number] [place] [place] [address] Phone: [phone] Fax: [phone]     Social Determinants of Health (SDOH) Interventions    Readmission Risk Interventions Readmission Risk Prevention Plan 11/19/2019  Transportation Screening Complete  PCP or Specialist Appt within 3-5 Days Complete  HRI or [place] Complete  Social Work Consult for [place] Planning/Counseling Complete  Palliative Care Screening Complete  Medication Review Press photographer) Complete  Some recent data might be hidden

## 2019-11-19 NOTE — Progress Notes (Signed)
[facility] Progress Note  Patient: [name]    FVC:[id number]  DOA: 11/13/2019     Date of Service: the patient was seen and examined on 11/19/2019  Chief Complaint  Patient presents with  . Shortness of Breath   Brief hospital course: Past medical history of CVA, ICH, HTN, HLD, type II DM, stage I lung cancer, aortic aneurysm, A. fib, chronic systolic CHF SP ICD implant, LAA closure, chronic hypoxic respiratory failure on 4 LPM.  Presented with complaints of shortness of breath found to have acute on chronic systolic CHF Currently further plan is continue diuresis.  Assessment and Plan: 1.  Acute on chronic systolic CHF. Pulmonary hypertension. Acute on chronic hypoxic respiratory failure On baseline 4 L of oxygen. Requiring more oxygen right now. EF 20 to 25% in the past. Currently on IV Lasix 40 mg.  Will increase the dose to 3 times daily. Also received albumin Dopplers are negative for DVT. Echocardiogram shows 25% EF. Daily weight and ins and out. Once improving will need to resume patient's beta-blocker which was held outpatient as well as add ACE ARB. Repeat chest x-ray on 11/17/2019 shows moderate interstitial pulmonary edema. add TED stockings  2.  History of COPD Baseline 4 LPM. Currently no evidence of exacerbation. Monitor. Outpatient follow-up with pulmonology.  3.  Permanent A. fib. SP AV ablation and ICD implant. History of bioprosthetic MVR as well as LAA closure. Not on any anticoagulation due to history of ICH. Not taking any rate control medication for now. Monitor on telemetry.  4.  Chronic microcytic anemia. No acute blood loss reported. Monitor H&H.  5.  Type 2 diabetes mellitus, uncontrolled with hyperglycemia. Currently on insulin sliding scale insulin.  Monitor.  6.  Hyperlipidemia Continue Lipitor.  7.  History of non-small cell lung cancer. SP wedge resection. Monitor.  8.  Suprapubic tenderness. Acute urinary  retention. Add Flomax. Suspect BPH. Currently able to urinate, but may require coud catheter. Patient has 400 cc although not retention post voiding on 11/19/2019.  Refused catheter.  9.  Chest pain. Troponin x3 not consistent with ACS. EKG nonischemic. Monitor.  10.  Low back pain. X-ray lumbar spine shows evidence of arthritis.  No acute fracture.  11.  Left shoulder pain. Patient reported pain after being repositioned in the bed after chest x-ray. X-ray chest on 11/17/2019 shows evidence of no acute osseous abnormality. Robaxin added. X-ray clavicle negative. X-ray shoulder negative.  12.  Near syncope. On 11/16/2019 patient had an episode of near syncope. Likely in the setting of vasovagal response while he was trying to strain for bowel movement as well as being on Flomax. Monitor on telemetry.  Diet: Cardiac diet DVT Prophylaxis: SCD, pharmacological prophylaxis contraindicated due to Anemia   Advance goals of care discussion: DNR  Family Communication: no family was present at bedside, at the time of interview.   Disposition:  Status is: Inpatient  Remains inpatient appropriate because:IV treatments appropriate due to intensity of illness or inability to take PO and Inpatient level of care appropriate due to severity of illness   Dispo: The patient is from: Home              Anticipated d/c is to: Home with home health              Anticipated d/c date is: 1 day              Patient currently is not medically stable to d/c.  Subjective: Breathing  improving.  Primary complaint is left shoulder pain.  No nausea no vomiting.  Continues to have swelling in the leg.  Physical Exam: General:  alert oriented to time, place, and person.  Appear in moderate distress, affect appropriate Eyes: PERRL ENT: Oral Mucosa Clear, moist  Neck: no JVD,  Cardiovascular: S1 and S2 Present, no Murmur,  Respiratory: increased respiratory effort, Bilateral Air entry equal and  Decreased, persistent bilateral crackles, no wheezes Abdomen: Bowel Sound present, Soft and no tenderness,  Skin: no rash Extremities: Improving pedal edema, no calf tenderness Neurologic: without any new focal findings Gait not checked due to patient safety concerns  Vitals:   11/19/19 0437 11/19/19 0836 11/19/19 0923 11/19/19 2019  BP: 101/63 (!) 107/56  119/63  Pulse: 74 75  73  Resp: 19 20  18   Temp: 97.6 F (36.4 C) 97.8 F (36.6 C)  (!) 97.5 F (36.4 C)  TempSrc: Oral Oral  Oral  SpO2: 91% 90% 91% 94%  Weight:      Height:        Intake/Output Summary (Last 24 hours) at 11/19/2019 2047 Last data filed at 11/19/2019 2039 Gross per 24 hour  Intake 1740 ml  Output 1900 ml  Net -160 ml   Filed Weights   11/17/19 0524 11/18/19 0437 11/19/19 0130  Weight: 72.2 kg 72.7 kg 73.7 kg    Data Reviewed: I have personally reviewed and interpreted daily labs, tele strips, imagings as discussed above. I reviewed all nursing notes, pharmacy notes, vitals, pertinent old records I have discussed plan of care as described above with RN and patient/family.  CBC: Recent Labs  Lab 11/13/19 1550 11/14/19 0453 11/15/19 0724 11/16/19 0818 11/17/19 0432 11/18/19 0548 11/19/19 0507  WBC 8.6   < > 7.3 6.5 7.8 6.6 6.8  NEUTROABS 6.9  --   --   --   --   --   --   HGB 9.3*   < > 8.8* 8.2* 7.9* 8.2* 8.6*  HCT 34.3*   < > 31.3* 29.6* 28.2* 29.8* 30.7*  MCV 77.3*   < > 74.2* 74.6* 74.0* 75.4* 75.6*  PLT 228   < > 190 180 163 179 184   < > = values in this interval not displayed.   Basic Metabolic Panel: Recent Labs  Lab 11/15/19 0724 11/16/19 0818 11/17/19 0432 11/18/19 0548 11/19/19 0507  NA 134* 135 131* 134* 132*  K 3.5 3.6 3.4* 3.8 3.8  CL 100 100 99 99 95*  CO2 27 28 27 26 24   GLUCOSE 125* 120* 156* 140* 131*  BUN 18 16 15 14 18   CREATININE 0.91 0.90 0.95 1.04 0.87  CALCIUM 7.9* 8.0* 8.1* 8.3* 8.4*    Studies: No results found.  Scheduled Meds: . alum & mag  hydroxide-simeth  30 mL Oral BID  . aspirin EC  81 mg Oral Daily  . atorvastatin  10 mg Oral Daily  . furosemide  40 mg Intravenous QID  . [START ON 11/20/2019] furosemide  40 mg Oral BID  . insulin aspart  0-5 Units Subcutaneous QHS  . insulin aspart  0-9 Units Subcutaneous TID WC  . lidocaine  1 application Urethral Once  . mouth rinse  15 mL Mouth Rinse BID  . methocarbamol  500 mg Oral TID  . mometasone-formoterol  2 puff Inhalation BID  . tamsulosin  0.4 mg Oral QPC supper  . vitamin B-12  1,000 mcg Oral Daily   Continuous Infusions: PRN Meds: acetaminophen **OR** acetaminophen,  albuterol, morphine injection, nitroGLYCERIN, senna-docusate, traMADol  Time spent: 35 minutes  Author: Berle Mull, MD [facility] 11/19/2019 8:47 PM  To reach On-call, see care teams to locate the attending and reach out to them via [email or web address]. If 7PM-7AM, please contact night-coverage If you still have difficulty reaching the attending provider, please page the [facility] (Director on Call) for [facility] on amion for assistance.

## 2019-11-19 NOTE — Progress Notes (Signed)
Patient has decreased output tonight, bladder scan done >700, RN explained in and out cath and pt stated he wold tryn to urinate. Patient urinated 250cc. Will explain and try in and out cath again.   BLE edema +3.

## 2019-11-19 NOTE — Consult Note (Signed)
   [facility] CM Inpatient Consult   11/19/2019  MANRAJ YEO 14-Jul-1945 [id number]   Patient was assessed for high risk scores for unplanned readmission of 23% in the [place] with Medicare Next Gen.   Patient has been previously active with [place] [facility].  Called the patient at bedside phone regarding being restarted with [facility] services. Patient verified HIPAA.  Patient states he is likely to go home tomorrow.  Explained the restart of services and patient verified it would be alright to call and speak with his wife.  Plan:  Will follow for progress and assign to [place] for post hospital needs.  Of note, [facility] Care Management services does not replace or interfere with any services that are arranged by inpatient [facility] care management team.   For additional questions or referrals please contact:  Natividad Brood, RN BSN [facility] Liaison  [phone] business mobile phone Toll free office [phone]  Fax number: [phone] [email or web address]

## 2019-11-20 DIAGNOSIS — I712 Thoracic aortic aneurysm, without rupture: Secondary | ICD-10-CM

## 2019-11-20 DIAGNOSIS — R338 Other retention of urine: Secondary | ICD-10-CM

## 2019-11-20 DIAGNOSIS — Z66 Do not resuscitate: Secondary | ICD-10-CM

## 2019-11-20 DIAGNOSIS — G894 Chronic pain syndrome: Secondary | ICD-10-CM

## 2019-11-20 DIAGNOSIS — D509 Iron deficiency anemia, unspecified: Secondary | ICD-10-CM

## 2019-11-20 DIAGNOSIS — I509 Heart failure, unspecified: Secondary | ICD-10-CM

## 2019-11-20 DIAGNOSIS — R5381 Other malaise: Secondary | ICD-10-CM

## 2019-11-20 DIAGNOSIS — E1165 Type 2 diabetes mellitus with hyperglycemia: Secondary | ICD-10-CM

## 2019-11-20 LAB — COMPREHENSIVE METABOLIC PANEL
ALT: 15 U/L (ref 0–44)
AST: 12 U/L — ABNORMAL LOW (ref 15–41)
Albumin: 2.7 g/dL — ABNORMAL LOW (ref 3.5–5.0)
Alkaline Phosphatase: 64 U/L (ref 38–126)
Anion gap: 10 (ref 5–15)
BUN: 17 mg/dL (ref 8–23)
CO2: 27 mmol/L (ref 22–32)
Calcium: 8.2 mg/dL — ABNORMAL LOW (ref 8.9–10.3)
Chloride: 96 mmol/L — ABNORMAL LOW (ref 98–111)
Creatinine, Ser: 0.99 mg/dL (ref 0.61–1.24)
GFR calc Af Amer: >60 mL/min (ref >60)
GFR calc non Af Amer: >60 mL/min (ref >60)
Glucose, Bld: 212 mg/dL — ABNORMAL HIGH (ref 70–99)
Potassium: 3.3 mmol/L — ABNORMAL LOW (ref 3.5–5.1)
Sodium: 133 mmol/L — ABNORMAL LOW (ref 135–145)
Total Bilirubin: 1.7 mg/dL — ABNORMAL HIGH (ref 0.3–1.2)
Total Protein: 5.6 g/dL — ABNORMAL LOW (ref 6.5–8.1)

## 2019-11-20 LAB — CBC WITH DIFFERENTIAL/PLATELET
Abs Immature Granulocytes: 0.04 10*3/uL (ref 0.00–0.07)
Basophils Absolute: 0.1 10*3/uL (ref 0.0–0.1)
Basophils Relative: 1 %
Eosinophils Absolute: 0.1 10*3/uL (ref 0.0–0.5)
Eosinophils Relative: 1 %
HCT: 31.2 % — ABNORMAL LOW (ref 39.0–52.0)
Hemoglobin: 8.5 g/dL — ABNORMAL LOW (ref 13.0–17.0)
Immature Granulocytes: 1 %
Lymphocytes Relative: 6 %
Lymphs Abs: 0.4 10*3/uL — ABNORMAL LOW (ref 0.7–4.0)
MCH: 20.9 pg — ABNORMAL LOW (ref 26.0–34.0)
MCHC: 27.2 g/dL — ABNORMAL LOW (ref 30.0–36.0)
MCV: 76.8 fL — ABNORMAL LOW (ref 80.0–100.0)
Monocytes Absolute: 0.7 10*3/uL (ref 0.1–1.0)
Monocytes Relative: 9 %
Neutro Abs: 5.9 10*3/uL (ref 1.7–7.7)
Neutrophils Relative %: 82 %
Platelets: 198 10*3/uL (ref 150–400)
RBC: 4.06 MIL/uL — ABNORMAL LOW (ref 4.22–5.81)
RDW: 21 % — ABNORMAL HIGH (ref 11.5–15.5)
WBC: 7.2 10*3/uL (ref 4.0–10.5)
nRBC: 0 % (ref 0.0–0.2)

## 2019-11-20 LAB — GLUCOSE, CAPILLARY
Glucose-Capillary: 136 mg/dL — ABNORMAL HIGH (ref 70–99)
Glucose-Capillary: 128 mg/dL — ABNORMAL HIGH (ref 70–99)
Glucose-Capillary: 142 mg/dL — ABNORMAL HIGH (ref 70–99)
Glucose-Capillary: 157 mg/dL — ABNORMAL HIGH (ref 70–99)

## 2019-11-20 LAB — MAGNESIUM: Magnesium: 1.8 mg/dL (ref 1.7–2.4)

## 2019-11-20 MED ORDER — BETHANECHOL CHLORIDE 25 MG PO TABS
25.0000 mg | ORAL_TABLET | Freq: Three times a day (TID) | ORAL | Status: DC
  Administered 2019-11-20 – 2019-11-22 (×6): 25 mg via ORAL
  Filled 2019-11-20 (×8): qty 1

## 2019-11-20 MED ORDER — POTASSIUM CHLORIDE CRYS ER 20 MEQ PO TBCR
40.0000 meq | EXTENDED_RELEASE_TABLET | ORAL | Status: AC
  Administered 2019-11-20 (×2): 40 meq via ORAL
  Filled 2019-11-20 (×2): qty 2

## 2019-11-20 MED ORDER — TRAMADOL HCL 50 MG PO TABS: 50.0000 mg | ORAL_TABLET | Freq: Three times a day (TID) | ORAL | Status: DC | PRN

## 2019-11-20 MED ORDER — BETHANECHOL CHLORIDE 10 MG PO TABS
10.0000 mg | ORAL_TABLET | Freq: Three times a day (TID) | ORAL | Status: DC
  Filled 2019-11-20 (×2): qty 1

## 2019-11-20 MED ORDER — FUROSEMIDE 10 MG/ML IJ SOLN
40.0000 mg | Freq: Two times a day (BID) | INTRAMUSCULAR | Status: DC
  Administered 2019-11-20 – 2019-11-23 (×7): 40 mg via INTRAVENOUS
  Filled 2019-11-20 (×7): qty 4

## 2019-11-20 MED ORDER — SODIUM CHLORIDE 0.9 % IV SOLN
510.0000 mg | Freq: Once | INTRAVENOUS | Status: AC
  Administered 2019-11-20: 18:00:00 510 mg via INTRAVENOUS
  Filled 2019-11-20: qty 17

## 2019-11-20 MED ORDER — ACETAMINOPHEN 500 MG PO TABS
1000.0000 mg | ORAL_TABLET | Freq: Three times a day (TID) | ORAL | Status: DC
  Administered 2019-11-20 – 2019-11-25 (×14): 1000 mg via ORAL
  Filled 2019-11-20 (×16): qty 2

## 2019-11-20 MED ORDER — INSULIN GLARGINE 100 UNIT/ML ~~LOC~~ SOLN
5.0000 [IU] | Freq: Every day | SUBCUTANEOUS | Status: DC
  Administered 2019-11-20 – 2019-11-25 (×6): 5 [IU] via SUBCUTANEOUS
  Filled 2019-11-20 (×6): qty 0.05

## 2019-11-20 NOTE — Progress Notes (Signed)
Pt current bladder scan >638cc pt refused to have foley cath Updated Daughter on plan of care which is Iv Lasix BID. Will have MD call with Prognosis.

## 2019-11-20 NOTE — Progress Notes (Signed)
Physical Therapy Discharge Patient Details Name: [name] MRN: [id number] DOB: Feb 10, 1946 Today's Date: 11/20/2019 Time:  -     Patient discharged from PT services secondary to pt reports he has no need for skilled therapy and does not want to work with therapy. Pt educated on role of acute care PT and importance of mobility during hospitalization. Pt reports he can walk "just fine" in his room and feels that his mobility is at his baseline.   Please see prior PT Evaluation (5/9) for current level of functioning, mobility, and d/c recommendations.   Progress and discharge plan discussed with patient and/or caregiver: Patient/Caregiver agrees with plan   Karma Ganja, PT, DPT   Acute Rehabilitation Department Pager #: [phone]   Otho Bellows 11/20/2019, 4:08 PM

## 2019-11-20 NOTE — Progress Notes (Signed)
Heart Failure Stewardship Pharmacist Progress Note   PCP: [name], NP PCP-Cardiologist: Candee Furbish, MD    HPI:  74 yo M with PMH significant of chronic hypoxemic respiratory failure on 4 L of home oxygen, systolic heart failure, history of CVA, ICH, HTN, HLD, T2 DM, lung cancer stage Ia, thoracic aortic aneurysm, permanent atrial fibrillation s/p ICD, LAA closure who presented to ED from PCP office on 5/5 with worsening shortness of breath and edema.  ECHO on 11/14/19 show LVEF of 25-30% with worsening diffuse hypokinesis.   Patient has been receiving IV diuresis throughout admission.   Current HF Medications: Furosemide 40 mg PO BID  Prior to admission HF Medications: Furosemide 40 mg daily Metoprolol succinate - not taking   Pertinent Lab Values: . Serum creatinine 0.99, BUN 17, Potassium 3.3, Sodium 133, BNP 1448.6, Magnesium 1.8  Vital Signs: . Weight: 160 lb (estimated dry weight: ~155 lbs) . Blood pressure: 110/60s  . Heart rate: 70s  Medication Assistance / Insurance Benefits Check: Does the patient have prescription insurance?   yes Type of insurance plan: Medicare + [place]  Does the patient qualify for medication assistance through manufacturers or grants?   Pending household income information  Eligible grants and/or patient assistance programs: pending  Medication assistance applications in progress: none  Medication assistance applications approved: none   Approved medication assistance renewals will be completed by: Dr. Marlou Porch office  Outpatient Pharmacy:  [facility] Is [facility] patient willing to use [place] at discharge?  TBD Is the patient willing to transition their outpatient pharmacy to utilize a [facility] outpatient pharmacy?  TBD    Assessment: 1. Chronic systolic CHF (EF 10-25%). NYHA class II/III symptoms.  - Continue furosemide 40 mg PO BID - Restart metoprolol  succinate when able - Consider low dose ACE/ARB - limited in the past due to orthostatic hypotension - Consider adding spironolactone    Plan: 1) Medication changes recommended at this time: - Add spironolactone 12.5 mg daily now that IV diuresis has been completed - can increase to 25 mg daily as able pending BP tolerability  2) Patient assistance application(s): - None at this time  3)  Education  - To be completed prior to discharge  Vertis Kelch, PharmD, BCPS Heart Failure Stewardship Pharmacist Phone [phone] 11/20/2019       10:53 AM

## 2019-11-20 NOTE — Progress Notes (Addendum)
Pt bladder scan showed residual of 950.  Pt refused in and out catheter to remove residual urine.

## 2019-11-20 NOTE — Progress Notes (Signed)
To the best of my knowledge, the student's charting is accurate.  

## 2019-11-20 NOTE — Progress Notes (Signed)
PROGRESS NOTE  [name] MOQ:[id number] DOB: 1945-08-24   PCP: [name], NP  Patient is from: Home  DOA: 11/13/2019 LOS: 7  Brief Narrative / Interim history: 74 year old male with history of CVA, ICH, HTN, HLD, type II DM, stage I lung cancer, aortic aneurysm, A. fib, chronic systolic CHF/ICD implant, LAA closure, chronic hypoxic respiratory failure on 4 LPM.  Presented with complaints of shortness of breath found to have acute on chronic systolic CHF.  Started on IV Lasix. Eventually, cardiology consulted  Subjective: Seen and examined earlier this morning.  No major events overnight of this morning.  No complaints but not a great historian.  He denies chest pain, dyspnea, GI or UTI symptoms.  Objective: Vitals:   11/20/19 0758 11/20/19 0800 11/20/19 0905 11/20/19 1200  BP:  111/62 115/73 110/72  Pulse:  70 66 75  Resp:  18 20 16   Temp:  97.8 F (36.6 C) 97.7 F (36.5 C) 97.6 F (36.4 C)  TempSrc:  Oral Oral Oral  SpO2: 93% 91% 93% 92%  Weight:      Height:        Intake/Output Summary (Last 24 hours) at 11/20/2019 1436 Last data filed at 11/20/2019 1300 Gross per 24 hour  Intake 1557 ml  Output 3850 ml  Net -2293 ml   Filed Weights   11/18/19 0437 11/19/19 0130 11/20/19 0436  Weight: 72.7 kg 73.7 kg 72.8 kg    Examination:  GENERAL: Frail but no apparent distress.  Nontoxic. HEENT: MMM.  Vision and hearing grossly intact.  NECK: Supple.  No apparent JVD.  RESP: On 5 L by [place].  No IWOB.  Fair aeration with bibasilar crackles. CVS:  RRR. Heart sounds normal.  ABD/GI/GU: BS+. Abd soft, NTND.  MSK/EXT:  Moves extremities.  2+ pitting edema.  TED hose in place. SKIN: no apparent skin lesion or wound NEURO: Awake, alert and oriented fairly.  No apparent focal neuro deficit. PSYCH: Calm. Normal affect.   Procedures:  None  Microbiology summarized: 5/5-COVID-19 PCR negative. 5/5-influenza PCR negative.  Assessment & Plan: Acute on chronic hypoxic  respiratory failure-multifactorial including CHF exacerbation, pHTN and underlying COPD.  On 4 L at baseline.  Requiring 5 L now. -Treat treatable causes -Wean oxygen as able -Incentive spirometry/OOB  Acute on chronic systolic CHF/NICM s/p BiV-ICD/pHTN: Echo with EF of 25 to 30%, diffuse hypokinesis, severe LAE and RAE, moderately reduced RVSF and severely enlarged RV.  Fair response to IV Lasix.  3.5 L UOP/24 hours.  Net -3.2 L.  Creatinine slightly up trended. -Consult cardiology for guidance -Continue IV Lasix 40 mg twice daily -GDMT-Per cardiology. -Monitor fluid status, renal function and electrolytes.  Chronic COPD: Not in exacerbation. -Continue home meds  -Oxygen as above.  Permanent A. Fib. S/p AV ablation History of bioprosthetic MVR as well as LAA closure. -Not on any anticoagulation due to history of ICH. -Not taking any rate control medication for now. -Continue monitoring on telemetry  Thoracic aortic aneurysm-followed by Dr. Roxy Manns.  Felt to be stable with no plan for surgical intervention given comorbidities -Outpatient follow-up  Iron deficiency anemia: Baseline Hgb 10-11>8.7 (4/29)> 9.3 (admit)>> 8.5.  Iron sat 3 with low ferritin and elevated TIBC. -IV Feraheme on 5/12.  Additional dose in 3 to 4 days -Monitor H&H  Uncontrolled DM-2 with hyperglycemia and hyperlipidemia: A1c 7.4%. Recent Labs    11/19/19 2130 11/20/19 0650 11/20/19 0733  GLUCAP 203* 136* 128*  -Continue SSI and statin.  History of non-small cell  lung cancer. -SP wedge resection. -Outpatient for lap  Suprapubic tenderness/Acute urinary retention: per urinates about  200cc at a time but bladder scan ~600ccc.  Patient refuses indwelling Foley catheter placement -Discontinue Robaxin and oxycodone -Continue Flomax. Add Urecholine. -Pelvic ultrasound   Atypical chest pain: Denies chest pain now.  Troponin x3 not consistent with ACS. EKG nonischemic. -Symptomatic pain management with  scheduled Tylenol and as needed tramadol  Low back pain/left shoulder pain-lumbar spine, shoulder, clavicle and chest film negative for acute finding -X-ray lumbar spine shows evidence of arthritis.  No acute fracture. -Scheduled Tylenol 1 g 3 times daily   Near syncope-some concern about vasovagal -Continue monitoring -Check orthostatic vitals.            DVT prophylaxis: SCD due to"severe allergies to anticoagulants" Code Status: DNR/DNI Family Communication: Updated patient's wife over the phone Status is: Inpatient  Remains inpatient appropriate because:Persistent severe electrolyte disturbances, IV treatments appropriate due to intensity of illness or inability to take PO and Inpatient level of care appropriate due to severity of illness   Dispo: The patient is from: Home              Anticipated d/c is to: To be determined              Anticipated d/c date is: 3 days              Patient currently is not medically stable to d/c.        Consultants:  Cardiology   Sch Meds:  Scheduled Meds: . alum & mag hydroxide-simeth  30 mL Oral BID  . aspirin EC  81 mg Oral Daily  . atorvastatin  10 mg Oral Daily  . furosemide  40 mg Intravenous BID  . insulin aspart  0-5 Units Subcutaneous QHS  . insulin aspart  0-9 Units Subcutaneous TID WC  . insulin glargine  5 Units Subcutaneous Daily  . lidocaine  1 application Urethral Once  . mouth rinse  15 mL Mouth Rinse BID  . methocarbamol  500 mg Oral TID  . mometasone-formoterol  2 puff Inhalation BID  . potassium chloride  40 mEq Oral Q4H  . tamsulosin  0.4 mg Oral QPC supper  . vitamin B-12  1,000 mcg Oral Daily   Continuous Infusions: PRN Meds:.acetaminophen **OR** acetaminophen, albuterol, morphine injection, nitroGLYCERIN, senna-docusate, traMADol  Antimicrobials: Anti-infectives (From admission, onward)   None      I have personally reviewed the following labs and images: CBC: Recent Labs  Lab  11/13/19 1550 11/14/19 0453 11/16/19 0818 11/17/19 0432 11/18/19 0548 11/19/19 0507 11/20/19 0820  WBC 8.6   < > 6.5 7.8 6.6 6.8 7.2  NEUTROABS 6.9  --   --   --   --   --  5.9  HGB 9.3*   < > 8.2* 7.9* 8.2* 8.6* 8.5*  HCT 34.3*   < > 29.6* 28.2* 29.8* 30.7* 31.2*  MCV 77.3*   < > 74.6* 74.0* 75.4* 75.6* 76.8*  PLT 228   < > 180 163 179 184 198   < > = values in this interval not displayed.   BMP &GFR Recent Labs  Lab 11/16/19 0818 11/17/19 0432 11/18/19 0548 11/19/19 0507 11/20/19 0820  NA 135 131* 134* 132* 133*  K 3.6 3.4* 3.8 3.8 3.3*  CL 100 99 99 95* 96*  CO2 28 27 26 24 27   GLUCOSE 120* 156* 140* 131* 212*  BUN 16 15 14 18 17   CREATININE  0.90 0.95 1.04 0.87 0.99  CALCIUM 8.0* 8.1* 8.3* 8.4* 8.2*  MG  --   --   --   --  1.8   Estimated Creatinine Clearance: 69.5 mL/min (by C-G formula based on SCr of 0.99 mg/dL). Liver & Pancreas: Recent Labs  Lab 11/20/19 0820  AST 12*  ALT 15  ALKPHOS 64  BILITOT 1.7*  PROT 5.6*  ALBUMIN 2.7*   No results for input(s): LIPASE, AMYLASE in the last 168 hours. No results for input(s): AMMONIA in the last 168 hours. Diabetic: No results for input(s): HGBA1C in the last 72 hours. Recent Labs  Lab 11/19/19 1151 11/19/19 1558 11/19/19 2130 11/20/19 0650 11/20/19 0733  GLUCAP 177* 259* 203* 136* 128*   Cardiac Enzymes: No results for input(s): CKTOTAL, CKMB, CKMBINDEX, TROPONINI in the last 168 hours. No results for input(s): PROBNP in the last 8760 hours. Coagulation Profile: No results for input(s): INR, PROTIME in the last 168 hours. Thyroid Function Tests: No results for input(s): TSH, T4TOTAL, FREET4, T3FREE, THYROIDAB in the last 72 hours. Lipid Profile: No results for input(s): CHOL, HDL, LDLCALC, TRIG, CHOLHDL, LDLDIRECT in the last 72 hours. Anemia Panel: No results for input(s): VITAMINB12, FOLATE, FERRITIN, TIBC, IRON, RETICCTPCT in the last 72 hours. Urine analysis:    Component Value Date/Time    COLORURINE YELLOW 06/07/2019 1231   APPEARANCEUR CLOUDY (A) 06/07/2019 1231   LABSPEC 1.013 06/07/2019 1231   PHURINE 5.0 06/07/2019 1231   GLUCOSEU 150 (A) 06/07/2019 1231   HGBUR SMALL (A) 06/07/2019 1231   HGBUR negative 04/05/2010 0837   BILIRUBINUR NEGATIVE 06/07/2019 1231   BILIRUBINUR n 03/27/2012 1323   KETONESUR NEGATIVE 06/07/2019 1231   PROTEINUR NEGATIVE 06/07/2019 1231   UROBILINOGEN 1.0 11/28/2013 1000   NITRITE NEGATIVE 06/07/2019 1231   LEUKOCYTESUR LARGE (A) 06/07/2019 1231   Sepsis Labs: Invalid input(s): PROCALCITONIN, [place]  Microbiology: Recent Results (from the past 240 hour(s))  Respiratory Panel by RT PCR (Flu A&B, Covid) - Nasopharyngeal Swab     Status: None   Collection Time: 11/13/19  6:27 PM   Specimen: Nasopharyngeal Swab  Result Value Ref Range Status   SARS Coronavirus 2 by RT PCR NEGATIVE NEGATIVE Final    Comment: (NOTE) SARS-CoV-2 target nucleic acids are NOT DETECTED. The SARS-CoV-2 RNA is generally detectable in upper respiratoy specimens during the acute phase of infection. The lowest concentration of SARS-CoV-2 viral copies this assay can detect is 131 copies/mL. A negative result does not preclude SARS-Cov-2 infection and should not be used as the sole basis for treatment or other patient management decisions. A negative result may occur with  improper specimen collection/handling, submission of specimen other than nasopharyngeal swab, presence of viral mutation(s) within the areas targeted by this assay, and inadequate number of viral copies (<131 copies/mL). A negative result must be combined with clinical observations, patient history, and epidemiological information. The expected result is Negative. Fact Sheet for Patients:  PinkCheek.be Fact Sheet for Healthcare Providers:  GravelBags.it This test is not yet ap proved or cleared by the [place] FDA and  has been  authorized for detection and/or diagnosis of SARS-CoV-2 by FDA under an Emergency Use Authorization (EUA). This EUA will remain  in effect (meaning this test can be used) for the duration of the COVID-19 declaration under Section 564(b)(1) of the Act, 21 U.S.C. section 360bbb-3(b)(1), unless the authorization is terminated or revoked sooner.    Influenza A by PCR NEGATIVE NEGATIVE Final   Influenza B by PCR  NEGATIVE NEGATIVE Final    Comment: (NOTE) The Xpert Xpress SARS-CoV-2/FLU/RSV assay is intended as an aid in  the diagnosis of influenza from Nasopharyngeal swab specimens and  should not be used as a sole basis for treatment. Nasal washings and  aspirates are unacceptable for Xpert Xpress SARS-CoV-2/FLU/RSV  testing. Fact Sheet for Patients: PinkCheek.be Fact Sheet for Healthcare Providers: GravelBags.it This test is not yet approved or cleared by the [place] FDA and  has been authorized for detection and/or diagnosis of SARS-CoV-2 by  FDA under an Emergency Use Authorization (EUA). This EUA will remain  in effect (meaning this test can be used) for the duration of the  Covid-19 declaration under Section 564(b)(1) of the Act, 21  U.S.C. section 360bbb-3(b)(1), unless the authorization is  terminated or revoked. Performed at [facility], [place] [address], [place], [place] [address]     Radiology Studies: No results found.     Sada Mazzoni T. Loaza  If 7PM-7AM, please contact night-coverage [email or web address] Password [facility] 11/20/2019, 2:36 PM

## 2019-11-20 NOTE — Progress Notes (Signed)
Pt bladder scan showed 857.  Pt refused in and out cath.  Encouraged pt to use urinal.

## 2019-11-20 NOTE — Progress Notes (Addendum)
Pt bladder scan showed 697.  Pt refused in and out cath.  Encouraged pt to use urinal.

## 2019-11-20 NOTE — Plan of Care (Signed)
  Problem: Education: Goal: Ability to demonstrate management of disease process will improve Outcome: Progressing  Bladder scan performed [id number] retention has made improvement

## 2019-11-20 NOTE — Progress Notes (Addendum)
Cardiology Consultation:   Patient ID: [name]; [id number]; 27-Dec-1945   Admit date: 11/13/2019 Date of Consult: 11/20/2019  Primary Care Provider: [name], NP Primary Cardiologist: Dr. Candee Furbish, MD Primary Electrophysiologist: Dr. Cristopher Peru, MD  Patient Profile:   [name] is a 74 y.o. male with a hx of MV endocarditis c/b severe MR s/p minimally invasive bioprosthetic MVR 2018, moderate pulmonary HTN, non-ischemic cardiomyopathy, aifb s/p nodal ablation (not able to on [facility] due to [place]),  s/p BI -V-ICD, lung cancer, sinus valsalva aneurysm, HTN, HLD, DM, CVA (2009), COPD on 3L [place] O2, chronic combined CHF with AICD placement who is being seen today for the evaluation of CHF at the request of Dr. Cyndia Skeeters  History of Present Illness:   [name] is a 74 year old male with a history as stated above who presented to [facility] on [facility]/13/2019 with shortness of breath and increased lower extremity edema for one week prior to presentation. He reported noticing increasing dyspnea with exertion however is very sedentary at baseline due to multiple comorbid conditions. On my interview, he reports that he was feeling fine with no concern other than LE edema.  Given his progressive symptoms, he presented to his PCPs office for evaluation at which time he was found to have an O2 saturation of 84% on 4 L (wears 4 L at baseline ). Given this, he was transferred to the ED for further work-up.  He was given IV Lasix for pulmonary and peripheral edema. BNP was markedly elevated at 1448.  He was admitted to hospitalist service for acute on chronic systolic CHF exacerbation with acute on chronic hypoxic respiratory failure.  Has been followed by Dr. Marlou Porch and Dr. Lovena Le for his cardiology care. His last ischemic evaluation was in 2009 which revealed 30% distal LAD stenosis, otherwise normal coronaries. He had follow-up coronary CTA studies which mentioned significant coronary artery calcifications of  the three major vessels, but does not classify them further. He has a h/o of afib s/p ablation, but has not been on anticoagulation due to h/o ICH. He has a h/o of sinus valsalva aneurysm follows by Dr. Roxy Manns with annual cardiac CTA, felt to be stable on last check 6/202 with no plans for intervention at that time due to numerous comorbid conditions and high risk status. Most recent echo in 11/2019 with an EF at 25-30% with global hypokinesis.   He had a hospitalization for CHF 05/2019 at which time cardiology was consulted. At sign off on 06/10/2019 plan was to 81, atorvastatin 10, furosemide 40 twice daily with as needed dosing, metoprolol succinate 12.5 with no room for ARB/ARN I secondary to low normal BP.  Baseline weight at that time was noted to be around 150lb. He was most recently seen in follow-up on 10/16/2019 by Dr. Marlou Porch at which time he was felt to be stable with no significant changes. Cardiomyopathy felt to be end-stage as he was followed by palliative care medicine.  Today he has no specific complaints. He denies chest pain, palpitations, orthopnea, dizziness, or syncope.   Past Medical History:  Diagnosis Date  . Atrial septal defect    Closed with surgery January, 2010  . Automatic implantable cardioverter-defibrillator in situ    LV dysfunction and pacer needed for AV node lesion  . Chronic combined systolic and diastolic CHF (congestive heart failure) ([place])   . Colon polyps   . COPD (chronic obstructive pulmonary disease) (HCC)    O2- 2 liters, nasal cannula,  q night   . CVA (cerebral vascular accident) ([place]) 2009   denies residual on 08/14/2013  . Diabetes mellitus without complication ([place])    type 2  . Dyslipidemia   . Endocarditis    Bacterial, 2009  . Hypertension   . Intracranial hemorrhage (HCC)    Coumadin cannot be used because of the history of his bleed  . Lung cancer ([place]) 11/29/2013   T1N0 Stage Ia non-small cell carcinoma left lung treated with wedge resection   . Myocardial infarction ([place]) 2010  . Pacemaker    combo pacer and icd  . Permanent atrial fibrillation    Originally Coumadin use for atrial fibrillation  //   he had intracerebral hemorrhage with an INR of 2.3 June, 2009. Anticoagulation could no longer be used.  //  Rapid atrial fibrillation after inferior MI October, 2010..........Marland Kitchen AV node ablation done at that time with ICD pacemaker placed (EF 35%).   //   Left atrial appendage tied off at the time of mitral valve surgery January, 2010 (maze pro  . Pneumonia 07/2018  . Pulmonary hypertension (HCC)    Moderate  . Renal artery stenosis (HCC)    Mild by history  . 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 Spontaneous pneumothorax    right thoracotomy - distant past  . Status post minimally invasive mitral valve replacement with bioprosthetic valve    33 mm Medtronic Mosaic porcine bioprosthesis placed via right mini thoracotomy for bacterial endocarditis complicated by severe MR and CHF   . Thoracic aortic aneurysm ([place]) 08/11/2016   a - Chest CTA 1/18:  Aneurysmal dilatation of aortic root is noted at 5.1 cm.     Past Surgical History:  Procedure Laterality Date  . APPENDECTOMY    . ASD REPAIR, SECUNDUM  07/17/2008   pericardial patch closure of ASD  . AV NODE ABLATION  07/2008   for rapid atrial fib  . CARDIAC CATHETERIZATION    . CARDIAC DEFIBRILLATOR PLACEMENT  ~ [address] [place]  . CARDIAC VALVE REPLACEMENT    . CATARACT EXTRACTION W/ INTRAOCULAR LENS  IMPLANT, BILATERAL Bilateral   . ESOPHAGOGASTRODUODENOSCOPY (EGD) WITH PROPOFOL N/A 09/29/2016   Procedure: ESOPHAGOGASTRODUODENOSCOPY (EGD) WITH PROPOFOL;  Surgeon: [name], MD;  Location: [facility] ENDOSCOPY;  Service: Endoscopy;  Laterality: N/A;  . HERNIA REPAIR    . IMPLANTABLE CARDIOVERTER DEFIBRILLATOR (ICD) GENERATOR CHANGE N/A 02/06/2012   Procedure: ICD GENERATOR CHANGE;  Surgeon: [name], MD;  Location[facility] CATH LAB;  Service: Cardiovascular;  Laterality: N/A;  .  INSERT / REPLACE / REMOVE PACEMAKER    . MASS BIOPSY Left    neck mass  . MITRAL VALVE REPLACEMENT Right 07/17/2008   61mm Medtronic Mosaic porcine bioprosthesis  . PENILE PROSTHESIS IMPLANT    . RIGHT HEART CATHETERIZATION N/A 08/16/2013   Procedure: RIGHT HEART CATH;  Surgeon: Josue Hector, MD;  Location: [facility] CATH LAB;  Service: Cardiovascular;  Laterality: N/A;  . TEE WITHOUT CARDIOVERSION N/A 09/06/2016   Procedure: TRANSESOPHAGEAL ECHOCARDIOGRAM (TEE);  Surgeon: Dorothy Spark, MD;  Location: [facility] ENDOSCOPY;  Service: Cardiovascular;  Laterality: N/A;  . THORACOTOMY Right 1970's   spontaneous pneumothorax - while in the [place]  . TOE SURGERY     left foot hammer toe  . TONSILLECTOMY    . VIDEO ASSISTED THORACOSCOPY (VATS)/WEDGE RESECTION Left 11/29/2013   Procedure: Video assisted thoracoscopy for wedge resection; mini thoracotomy;  Surgeon: [name], MD;  Location: [facility];  Service:  Thoracic;  Laterality: Left;     Prior to Admission medications   Medication Sig Start Date End Date Taking? Authorizing Provider  amoxicillin (AMOXIL) 500 MG capsule Take 2,000 mg by mouth See admin instructions. Take 2,000 mg by mouth one hour prior to dental appointments   Yes [provider]  aspirin EC 81 MG tablet Take 1 tablet (81 mg total) by mouth daily. 12/07/12  Yes Carlena Bjornstad, MD  atorvastatin (LIPITOR) 10 MG tablet Take 10 mg by mouth daily.   Yes [provider]  Budeson-Glycopyrrol-Formoterol (BREZTRI AEROSPHERE) 160-9-4.8 MCG/ACT AERO Inhale 2 puffs into the lungs in the morning and at bedtime. 11/08/19  Yes Martyn Ehrich, NP  furosemide (LASIX) 40 MG tablet TAKE 1 TABLET BY MOUTH TWICE A DAY Patient taking differently: Take 40 mg by mouth daily.  09/04/19  Yes Nafziger, Tommi Rumps, NP  metFORMIN (GLUCOPHAGE) 1000 MG tablet TAKE 1 TABLET BY MOUTH TWICE A DAY WITH MEALS Patient taking differently: Take 1,000 mg by mouth 2 (two) times daily with a meal.  09/13/19  Yes  Nafziger, Tommi Rumps, NP  nitroGLYCERIN (NITROSTAT) 0.4 MG SL tablet Place 0.4 mg under the tongue every 5 (five) minutes x 3 doses as needed for chest pain.    Yes [provider]  OXYGEN Inhale 4 L/min into the lungs continuous.    Yes [provider]  potassium chloride SA (KLOR-CON) 20 MEQ tablet Take 2 tablets (40 mEq total) by mouth daily. 08/12/19 11/13/19 Yes Nafziger, Tommi Rumps, NP  budesonide-formoterol (SYMBICORT) 160-4.5 MCG/ACT inhaler INHALE 2 PUFFS INTO THE LUNGS TWICE A DAY Patient not taking: Reported on 11/13/2019 05/22/19   Lauraine Rinne, NP  metoprolol succinate (TOPROL-XL) 25 MG 24 hr tablet Take 0.5 tablets (12.5 mg total) by mouth daily. Patient not taking: Reported on 11/13/2019 06/11/19   Elmarie Shiley, MD    Inpatient Medications: Scheduled Meds: . alum & mag hydroxide-simeth  30 mL Oral BID  . aspirin EC  81 mg Oral Daily  . atorvastatin  10 mg Oral Daily  . furosemide  40 mg Oral BID  . insulin aspart  0-5 Units Subcutaneous QHS  . insulin aspart  0-9 Units Subcutaneous TID WC  . insulin glargine  5 Units Subcutaneous Daily  . lidocaine  1 application Urethral Once  . mouth rinse  15 mL Mouth Rinse BID  . methocarbamol  500 mg Oral TID  . mometasone-formoterol  2 puff Inhalation BID  . tamsulosin  0.4 mg Oral QPC supper  . vitamin B-12  1,000 mcg Oral Daily   Continuous Infusions:  PRN Meds: acetaminophen **OR** acetaminophen, albuterol, morphine injection, nitroGLYCERIN, senna-docusate, traMADol  Allergies:    Allergies  Allergen Reactions  . Anticoagulant Compound Other (See Comments)    Pt had intracranial bleed, therefore all anticoagulation is contraindicated per Dr. Ron Parker  . Other Other (See Comments)    Per Dr. Halford Chessman (Surgeon): stated that the patient cannot be put under for any surgery, as he has an enlarged aorta. He would stand only a 50/50 chance of surviving. He has lung issues, diminished lung tissue, COPD, and emphysema.  .  Warfarin Sodium Other (See Comments)    Pt had intracranial bleed, therefore all anticoagulation is contraindicated per Dr. Ron Parker    Social History:   Social History   Socioeconomic History  . Marital status: Married    Spouse name: [name]  . Number of children: 2  . Years of education: College  . Highest  education level: Not on file  Occupational History  . 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 Smoking status: Former Smoker    Packs/day: 1.00    Years: 45.00    Pack years: 45.00    Types: Cigarettes    Quit date: 2011    Years since quitting: 10.3  . Smokeless tobacco: Never Used  Substance and Sexual Activity  . Alcohol use: No    Alcohol/week: 0.0 standard drinks    Comment: 08/14/2013 "used to drink beer; quit:in 1982"  . Drug use: No  . Sexual activity: Yes  Other Topics Concern  . Not on file  Social History Narrative   Patient lives at home with his spouse.   Worked for the post office   Has two boys and a girl. All live local.    1 Mining engineer   Social Determinants of Health   Financial Resource Strain: Low Risk   . Difficulty of Paying Living Expenses: Not hard at all  Food Insecurity: No Food Insecurity  . Worried About Charity fundraiser in the Last Year: Never true  . Ran Out of Food in the Last Year: Never true  Transportation Needs: No Transportation Needs  . Lack of Transportation (Medical): No  . Lack of Transportation (Non-Medical): No  Physical Activity: Inactive  . Days of Exercise per Week: 0 days  . Minutes of Exercise per Session: 0 min  Stress: No Stress Concern Present  . Feeling of Stress : Only a little  Social Connections: Unknown  . Frequency of Communication with Friends and Family: More than three times a week  . Frequency of Social Gatherings with Friends and Family: Three times a week  . Attends Religious Services: Not on file  . Active Member of Clubs or Organizations: Not  on file  . Attends Archivist Meetings: Not on file  . Marital Status: Married  Human resources officer Violence:   . Fear of Current or Ex-Partner:   . Emotionally Abused:   Marland Kitchen Physically Abused:   . Sexually Abused:     Family History:   Family History  Problem Relation Age of Onset  . Stomach cancer Father   . Stroke Mother    Family Status:  Family Status  Relation Name Status  . Father  Deceased at age 19  . Mother  Deceased at age 32  . MGM  Deceased  . MGF  Deceased  . PGM  Deceased  . PGF  Deceased    ROS:  Please see the history of present illness.  All other ROS reviewed and negative.     Physical Exam/Data:   Vitals:   11/20/19 0436 11/20/19 0758 11/20/19 0800 11/20/19 0905  BP: (!) 109/54  111/62 115/73  Pulse: 73  70 66  Resp: 16  18 20   Temp: 98.2 F (36.8 C)  97.8 F (36.6 C) 97.7 F (36.5 C)  TempSrc: Oral  Oral Oral  SpO2: 91% 93% 91% 93%  Weight: 72.8 kg     Height:        Intake/Output Summary (Last 24 hours) at 11/20/2019 0957 Last data filed at 11/20/2019 0900 Gross per 24 hour  Intake 1460 ml  Output 3675 ml  Net -2215 ml   Filed Weights   11/18/19 0437 11/19/19 0130 11/20/19 0436  Weight: 72.7 kg 73.7 kg 72.8 kg   Body mass index is 21.16 kg/m.  General: Frail, NAD Neck: Negative for carotid bruits. No JVD Lungs: Diminished bilaterally.Breathing is unlabored on 4L [place] Cardiovascular: Irregularly irregular with S1 S2. + murmurs.  Abdomen: Soft, non-tender, non-distended. No obvious abdominal masses. Extremities: 2+ BLE. Radial pulses 2+ bilaterally Neuro: Alert and oriented. HOH. MAE spontaneously. Psych: Responds to questions appropriately with normal affect.     EKG:  The EKG was personally reviewed and demonstrates:  11/15/19 NSR with V pacing  Telemetry:  Telemetry was personally reviewed and demonstrates:   Relevant CV Studies:  ECHO: 11/14/2019:  1. LVEF is severely depressed with diffuse hypokinesis, worse in the    septal and apical walls Compared to report from Nov 2020 echo, LVEF is  worse (apical views foreshortened in 2020 exam which makes assessment  difficult). Left ventricular ejection  fraction, by estimation, is 25 to 30%. The left ventricle has severely  decreased function. The left ventricle demonstrates global hypokinesis.  There is mild left ventricular hypertrophy. Left ventricular diastolic  parameters are indeterminate.  2. Right ventricular systolic function is moderately reduced. The right  ventricular size is severely enlarged. There is moderately elevated  pulmonary artery systolic pressure.  3. Left atrial size was severely dilated.  4. Right atrial size was severely dilated.  5. A 33 mm Medtronic Mosai bioprosthesisiis present Peak and mean  gradients through the vave are 15 and 6 mm Hg respectively, both lower  than previous echo report in 2020.Marland Kitchen The mitral valve has been  repaired/replaced. Trivial mitral valve regurgitation.  6. The aortic valve is abnormal. Aortic valve regurgitation is not  visualized. Mild to moderate aortic valve sclerosis/calcification is  present, without any evidence of aortic stenosis.  7. Aortic root is 51 mm Previous echo measured echo above this; current  exam did not image that high.. Aortic dilatation noted. Aneurysm of the  aortic root. There is moderate to severe dilatation of the aortic root  measuring 51 mm.  8. The inferior vena cava is dilated in size with <50% respiratory  variability, suggesting right atrial pressure of 15 mmHg.   Echo 06/07/2019:  1. Left ventricular ejection fraction, by visual estimation, is 25 to  30%. The left ventricle has severely decreased function. There is no left  ventricular hypertrophy.  2. Aortic root aneurysm present up to 62 mm with dilated of the R sinus  of valsalva. Appears to be stable from most recent cardiac CTA.  3. A 33 mm metronic mosaic bioprosthetic mitral valve is present. The   mean gradient across the valve is 5 mmHg at 74 bpm. EOA by VTI is 1.4 cm2.  There is trace regurgitation which is likely paravalvular. Gradients  stable from prior study.  4. Abnormal septal motion consistent with post-operative status.  5. Left ventricular diastolic function could not be evaluated.  6. Mildly dilated left ventricular internal cavity size.  7. The left ventricle demonstrates regional wall motion abnormalities.  8. Apical akinesis present.  9. Global right ventricle has severely reduced systolic function.The  right ventricular size is mildly enlarged. No increase in right  ventricular wall thickness.  10. Left atrial size was severely dilated.  11. Right atrial size was severely dilated.  12. The mitral valve has been repaired/replaced. Trace mitral valve  regurgitation.  13. The tricuspid valve is grossly normal. Tricuspid valve regurgitation  is trivial.  14. The aortic valve is tricuspid. Aortic valve regurgitation is not  visualized. No evidence of aortic valve sclerosis or stenosis.  15. The pulmonic valve was  grossly normal. Pulmonic valve regurgitation is  not visualized.  16. Aneurysm of the aortic root, measuring 62 mm.  17. Aortic dilatation noted.  18. TR signal is inadequate for assessing pulmonary artery systolic  pressure.  19. A pacer wire is visualized in the RA and RV.  20. The inferior vena cava is dilated in size with <50% respiratory  variability, suggesting right atrial pressure of 15 mmHg.    Echo 04/28/19 1. Left ventricular ejection fraction, by visual estimation, is 25 to 30%. The left ventricle has moderate to severely decreased function. Mildly increased left ventricular size. There is mildly increased left ventricular hypertrophy. 2. Multiple segmental abnormalities exist. See findings. 3. Left ventricular diastolic Doppler parameters are indeterminate pattern of LV diastolic filling. 4. Global right ventricle has moderately  reduced systolic function.The right ventricular size is normal. No increase in right ventricular wall thickness. 5. Left atrial size was severely dilated. 6. Right atrial size was severely dilated. 7. Mild aortic valve annular calcification. 8. The mitral valve has been repaired/replaced. There is a 33 mm Medtronic Mosiac prosthesis in position. Trace mitral valve regurgitation. Mean gradient of 5 mmHg suggest mildly stenotic flow. 9. The tricuspid valve is grossly normal. Tricuspid valve regurgitation is mild. 10. The aortic valve is tricuspid Aortic valve regurgitation is trivial by color flow Doppler. 11. The pulmonic valve was grossly normal. Pulmonic valve regurgitation is not visualized by color flow Doppler. 12. The aortic root was not well visualized. 13. Proximal aorta and arch are not well defined. The aortic root appears dilated. CTA would define course better if clinically indicated. 14. Moderately elevated pulmonary artery systolic pressure. 15. A device wire is visualized. 16. The inferior vena cava is dilated in size with <50% respiratory variability, suggesting right atrial pressure of 15 mmHg. 17. The tricuspid regurgitant velocity is 3.19 m/s, and with an assumed right atrial pressure of 15 mmHg, the estimated right ventricular systolic pressure is moderately elevated at 55.7 mmHg.   Laboratory Data:  Chemistry Recent Labs  Lab 11/17/19 0432 11/18/19 0548 11/19/19 0507  NA 131* 134* 132*  K 3.4* 3.8 3.8  CL 99 99 95*  CO2 27 26 24   GLUCOSE 156* 140* 131*  BUN 15 14 18   CREATININE 0.95 1.04 0.87  CALCIUM 8.1* 8.3* 8.4*  GFRNONAA >60 >60 >60  GFRAA >60 >60 >60  ANIONGAP 5 9 13     Total Protein  Date Value Ref Range Status  07/30/2019 7.2 6.5 - 8.1 g/dL Final  12/20/2016 7.1 6.4 - 8.3 g/dL Final   Albumin  Date Value Ref Range Status  07/30/2019 4.0 3.5 - 5.0 g/dL Final  12/20/2016 4.1 3.5 - 5.0 g/dL Final   AST  Date Value Ref Range Status    07/30/2019 7 (L) 15 - 41 U/L Final  12/20/2016 11 5 - 34 U/L Final   ALT  Date Value Ref Range Status  07/30/2019 <6 0 - 44 U/L Final  12/20/2016 8 0 - 55 U/L Final   Alkaline Phosphatase  Date Value Ref Range Status  07/30/2019 71 38 - 126 U/L Final  12/20/2016 64 40 - 150 U/L Final   Total Bilirubin  Date Value Ref Range Status  07/30/2019 2.0 (H) 0.3 - 1.2 mg/dL Final  12/20/2016 3.01 (H) 0.20 - 1.20 mg/dL Final   Hematology Recent Labs  Lab 11/17/19 0432 11/18/19 0548 11/19/19 0507  WBC 7.8 6.6 6.8  RBC 3.81* 3.95* 4.06*  HGB 7.9* 8.2* 8.6*  HCT 28.2* 29.8*  30.7*  MCV 74.0* 75.4* 75.6*  MCH 20.7* 20.8* 21.2*  MCHC 28.0* 27.5* 28.0*  RDW 18.7* 19.1* 19.7*  PLT 163 179 184   Cardiac EnzymesNo results for input(s): TROPONINI in the last 168 hours. No results for input(s): TROPIPOC in the last 168 hours.  BNP Recent Labs  Lab 11/13/19 1550  BNP 1,448.6*    DDimer  Recent Labs  Lab 11/15/19 1123  DDIMER 1.44*   TSH:  Lab Results  Component Value Date   TSH 3.37 04/12/2019   Lipids: Lab Results  Component Value Date   CHOL 85 04/12/2019   HDL 45.30 04/12/2019   LDLCALC 20 04/12/2019   LDLDIRECT 80.2 03/31/2011   TRIG 95.0 04/12/2019   CHOLHDL 2 04/12/2019   HgbA1c: Lab Results  Component Value Date   HGBA1C 7.4 (H) 11/14/2019    Radiology/Studies:  DG Lumbar Spine 2-3 Views  Result Date: 11/16/2019 CLINICAL DATA:  Lower back pain. EXAM: LUMBAR SPINE - 2-3 VIEW COMPARISON:  None. FINDINGS: There is no evidence of an acute lumbar spine fracture. Chronic loss of vertebral body height is seen at the levels of T12 and L1. Approximately 2 mm retrolisthesis of the L3 vertebral body is noted on L4. Approximately 2 mm anterolisthesis of the L4 vertebral body is seen on L5. There is moderate severity multilevel endplate sclerosis with moderate severity multilevel intervertebral disc space narrowing. Marked severity calcification of the abdominal aorta and  bilateral common iliac arteries is seen. IMPRESSION: 1. Chronic loss of vertebral body height at T12 and L1. 2. Moderate severity multilevel degenerative disc disease. 3. Marked severity atherosclerotic calcification of the abdominal aorta and bilateral common iliac arteries. Electronically Signed   By: Virgina Norfolk M.D.   On: 11/16/2019 19:13   DG Clavicle Left  Result Date: 11/18/2019 CLINICAL DATA:  Left shoulder pain EXAM: LEFT CLAVICLE - 2+ VIEWS COMPARISON:  None. FINDINGS: Negative for fracture AC degenerative change in spurring. Narrowing of the acromial humeral distance compatible with rotator cuff disease. Mild degenerative change in the shoulder joint. Staples in the left upper lobe. IMPRESSION: Rotator cuff disease.  Negative for fracture. Electronically Signed   By: Franchot Gallo M.D.   On: 11/18/2019 12:25   DG Chest Port 1 View  Result Date: 11/17/2019 CLINICAL DATA:  Shortness of breath. EXAM: PORTABLE CHEST 1 VIEW COMPARISON:  Nov 15, 2019 FINDINGS: Right-sided cardiac pacemaker/defibrillator is in stable position. The cardiac silhouette is stably enlarged. Moderately increased interstitial markings. Small bilateral pleural effusions. Bullous emphysematous changes in the upper lobes again noted. Osseous structures are without acute abnormality. Soft tissues are grossly normal. IMPRESSION: 1. Stably enlarged cardiac silhouette. 2. Moderate interstitial pulmonary edema with small bilateral pleural effusions. Electronically Signed   By: Fidela Salisbury M.D.   On: 11/17/2019 09:28   DG Shoulder Left  Result Date: 11/18/2019 CLINICAL DATA:  Left shoulder pain EXAM: LEFT SHOULDER - 2+ VIEW COMPARISON:  None. FINDINGS: Negative for fracture or dislocation. Degenerative change in spurring in the [facility] joint. Irregularity of the greater tuberosity compatible with rotator cuff disease. Narrowing of the acromial humeral distance compatible with rotator cuff disease. Left upper lobe lobectomy  clips are present. IMPRESSION: Degenerative change in the left shoulder with rotator cuff disease. No acute fracture. Electronically Signed   By: Franchot Gallo M.D.   On: 11/18/2019 12:24   Assessment and Plan:   1.  Acute on chronic systolic CHF exacerbation: -Patient presented with worsening shortness of breath and edema concerning for  acute CHF exacerbation found to have a BNP and CXR consistent with fluid volume overload -He was initially started on IV Lasix 40 mg twice daily with adequate response -Echocardiogram this hospital admission 11/14/2019 with diffuse hypokinesis worse in the septal and apical walls compared to 05/2019 study.  LVEF noted to be 25 to 30%, aortic root measuring 51 mm -Weight, -3.2 L -I&O, 160lb today with an admission weight of 159lb>>>weight on 10/16/19 was 149lb.  -Does have fluid overload in his LE>>would increase IV Lasix to 60mg  BID and follow response. Edema also could be secondary to hypoalbuminemia.    2.  Acute on chronic hypoxic respiratory failure/COPD: -Wears home supplemental O2 4 L nasal cannula. On PCP presentation, O2 saturations found to be in the mid 80s therefore he was transported to the ED for further evaluation -Plan was for PTA inhalers -Management per primary team  3.  Permanent atrial fibrillation status post AVN ablation: -Long history of permanent atrial fibrillation, status post AV and ablation/atrial appendage clipping per EP not on anticoagulation secondary to [place] -PTA metoprolol for rate control>>>currenlty on hold secondary to low normal BP  4.  Nonischemic cardiomyopathy s/p BiV ICD: -Previous echocardiogram 05/2019 with EF at 20 to 25% with most recent echocardiogram this admission -ast echo in 11/2019 with an EF at 25-30% with global hypokinesis.  - On PTA ASA 81, atorvastatin 10, furosemide 40 twice daily with as needed dosing, metoprolol succinate 12.5 with no room for ARB/ARN I secondary to low normal BP.   -Cardiomyopathy felt  to be end-stage as he was followed by palliative care medicine.  5.  History of MVR: -In the setting of mitral valve endocarditis found to be stable per last echocardiogram  6.  Thoracic aortic aneurysm: -Followed closely by Dr. Roxy Manns felt to be stable on last imaging with no plans for surgical intervention due to comorbid conditions and high risk status.  7.  History of non-small cell lung CA: -Status post wedge resection with no acute issues   For questions or updates, please contact [place] Please consult [email or web address] for contact info under Cardiology/STEMI.   Lyndel Safe NP-C [facility] Pager: [phone] 11/20/2019 9:57 AM  Patient seen and examined with Kathyrn Drown NP-C.  Agree as above, with the following exceptions and changes as noted below. Patient states that he feels well and back to baseline, feels like he would like to go home soon. Gen: Frail, NAD, CV: irregular, soft systolic murmur, Lungs: crackles in bases, Abd: soft, Extrem: Warm, 2+ edema with weeping, Neuro/Psych: alert and oriented x 3, normal mood and affect. All available labs, radiology testing, previous records reviewed. Admitted with volume overload, now on day 7 of hospital stay. Cardiology consulted for ongoing management of heart failure. Agree with recommendation above to increase lasix and monitor for response, patient feels back to baseline however and feels he is ready for dismissal soon.   Elouise Munroe, MD 11/20/19 6:14 PM

## 2019-11-21 LAB — COMPREHENSIVE METABOLIC PANEL
ALT: 16 U/L (ref 0–44)
AST: 14 U/L — ABNORMAL LOW (ref 15–41)
Albumin: 3 g/dL — ABNORMAL LOW (ref 3.5–5.0)
Alkaline Phosphatase: 66 U/L (ref 38–126)
Anion gap: 10 (ref 5–15)
BUN: 17 mg/dL (ref 8–23)
CO2: 28 mmol/L (ref 22–32)
Calcium: 8.7 mg/dL — ABNORMAL LOW (ref 8.9–10.3)
Chloride: 100 mmol/L (ref 98–111)
Creatinine, Ser: 1.09 mg/dL (ref 0.61–1.24)
GFR calc Af Amer: >60 mL/min (ref >60)
GFR calc non Af Amer: >60 mL/min (ref >60)
Glucose, Bld: 153 mg/dL — ABNORMAL HIGH (ref 70–99)
Potassium: 3.8 mmol/L (ref 3.5–5.1)
Sodium: 138 mmol/L (ref 135–145)
Total Bilirubin: 2.1 mg/dL — ABNORMAL HIGH (ref 0.3–1.2)
Total Protein: 6.2 g/dL — ABNORMAL LOW (ref 6.5–8.1)

## 2019-11-21 LAB — GLUCOSE, CAPILLARY
Glucose-Capillary: 158 mg/dL — ABNORMAL HIGH (ref 70–99)
Glucose-Capillary: 131 mg/dL — ABNORMAL HIGH (ref 70–99)
Glucose-Capillary: 171 mg/dL — ABNORMAL HIGH (ref 70–99)
Glucose-Capillary: 112 mg/dL — ABNORMAL HIGH (ref 70–99)
Glucose-Capillary: 148 mg/dL — ABNORMAL HIGH (ref 70–99)

## 2019-11-21 LAB — MAGNESIUM: Magnesium: 1.9 mg/dL (ref 1.7–2.4)

## 2019-11-21 LAB — PHOSPHORUS: Phosphorus: 3.3 mg/dL (ref 2.5–4.6)

## 2019-11-21 NOTE — Progress Notes (Signed)
Heart Failure Stewardship Pharmacist Progress Note   PCP: [name], NP PCP-Cardiologist: Candee Furbish, MD    HPI:  74 yo M with PMH significant of chronic hypoxemic respiratory failure on 4 L of home oxygen, systolic heart failure, history of CVA, ICH, HTN, HLD, T2 DM, lung cancer stage Ia, thoracic aortic aneurysm, permanent atrial fibrillation s/p ICD, LAA closure who presented to ED from PCP office on 5/5 with worsening shortness of breath and edema.  ECHO on 11/14/19 show LVEF of 25-30% with worsening diffuse hypokinesis.   Patient has been receiving IV diuresis throughout admission. Good urine output yesterday. Weight down 2 lbs.    Current HF Medications: Furosemide 40 mg IV BID  Prior to admission HF Medications: Furosemide 40 mg daily Metoprolol succinate - not taking   Pertinent Lab Values: . Serum creatinine 1.09, BUN 17, Potassium 3.8, Sodium 138, BNP 1448.6, Magnesium 1.9  Vital Signs: . Weight: 158 lb (estimated dry weight: ~155 lbs) . Blood pressure: 110/60s  . Heart rate: 70s  Medication Assistance / Insurance Benefits Check: Does the patient have prescription insurance?   yes Type of insurance plan: Medicare + [place]  Does the patient qualify for medication assistance through manufacturers or grants?   Pending household income information  Eligible grants and/or patient assistance programs: pending  Medication assistance applications in progress: none  Medication assistance applications approved: none   Approved medication assistance renewals will be completed by: Dr. Marlou Porch office  Outpatient Pharmacy:  Prior to admission outpatient pharmacy: [facility] Is the patient willing to use [facility] at discharge?  TBD Is the patient willing to transition their outpatient pharmacy to utilize a [facility] outpatient pharmacy?  TBD    Assessment: 1. Chronic systolic CHF (EF 77-82%). NYHA class II/III symptoms. [place] volume  overloaded with 2+ pitting edema on MD exam. - Continue furosemide 40 mg IV BID - diuresed well yesterday - Restart metoprolol succinate when able - Consider low dose ACE/ARB - limited in the past due to orthostatic hypotension - Consider adding spironolactone  - Feraheme x 1 given on 5/12   Plan: 1) Medication changes recommended at this time: - None - BP soft on current therapy  2) Patient assistance application(s): - None at this time  3)  Education  - To be completed prior to discharge  Vertis Kelch, PharmD, BCPS Heart Failure Stewardship Pharmacist Phone [phone] 11/21/2019       9:55 AM

## 2019-11-21 NOTE — Progress Notes (Signed)
Progress Note  Patient Name: [name] Date of Encounter: 11/21/2019  Primary Cardiologist: Candee Furbish, MD   Subjective   No chest pain. Sitting on side of bed.  Inpatient Medications    Scheduled Meds: . acetaminophen  1,000 mg Oral Q8H  . alum & mag hydroxide-simeth  30 mL Oral BID  . aspirin EC  81 mg Oral Daily  . atorvastatin  10 mg Oral Daily  . bethanechol  25 mg Oral TID  . furosemide  40 mg Intravenous BID  . insulin aspart  0-5 Units Subcutaneous QHS  . insulin aspart  0-9 Units Subcutaneous TID WC  . insulin glargine  5 Units Subcutaneous Daily  . lidocaine  1 application Urethral Once  . mouth rinse  15 mL Mouth Rinse BID  . mometasone-formoterol  2 puff Inhalation BID  . tamsulosin  0.4 mg Oral QPC supper  . vitamin B-12  1,000 mcg Oral Daily   Continuous Infusions:  PRN Meds: albuterol, nitroGLYCERIN, senna-docusate, traMADol   Vital Signs    Vitals:   11/21/19 0416 11/21/19 0732 11/21/19 0836 11/21/19 1114  BP: 111/67  106/60 104/64  Pulse: 73  74 74  Resp: 18  18 20   Temp: 98 F (36.7 C)   97.6 F (36.4 C)  TempSrc: Oral   Oral  SpO2: 93% 95% (!) 89% 90%  Weight: 71.8 kg     Height:        Intake/Output Summary (Last 24 hours) at 11/21/2019 1509 Last data filed at 11/21/2019 1321 Gross per 24 hour  Intake 955 ml  Output 2275 ml  Net -1320 ml   Last 3 Weights 11/21/2019 11/20/2019 11/19/2019  Weight (lbs) 158 lb 3.2 oz 160 lb 6.4 oz 162 lb 6.4 oz  Weight (kg) 71.759 kg 72.757 kg 73.664 kg      Telemetry    Paced rhythm with intermittent NSVT - Personally Reviewed  ECG    No new - Personally Reviewed  Physical Exam   GEN: No acute distress.   Neck: No JVD Cardiac: RRR, soft systolic murmur, no rubs, or gallops.  Respiratory: Clear to auscultation bilaterally. GI: Soft, nontender, non-distended  MS: 1+edema; No deformity. Neuro:  Nonfocal  Psych: Normal affect   Labs    High Sensitivity Troponin:   Recent Labs  Lab  11/13/19 1550 11/13/19 1737 11/15/19 1123 11/15/19 1238 11/15/19 1636  TROPONINIHS 18* 20* 19* 18* 21*      Chemistry Recent Labs  Lab 11/19/19 0507 11/20/19 0820 11/21/19 0735  NA 132* 133* 138  K 3.8 3.3* 3.8  CL 95* 96* 100  CO2 24 27 28   GLUCOSE 131* 212* 153*  BUN 18 17 17   CREATININE 0.87 0.99 1.09  CALCIUM 8.4* 8.2* 8.7*  PROT  --  5.6* 6.2*  ALBUMIN  --  2.7* 3.0*  AST  --  12* 14*  ALT  --  15 16  ALKPHOS  --  64 66  BILITOT  --  1.7* 2.1*  GFRNONAA >60 >60 >60  GFRAA >60 >60 >60  ANIONGAP 13 10 10      Hematology Recent Labs  Lab 11/18/19 0548 11/19/19 0507 11/20/19 0820  WBC 6.6 6.8 7.2  RBC 3.95* 4.06* 4.06*  HGB 8.2* 8.6* 8.5*  HCT 29.8* 30.7* 31.2*  MCV 75.4* 75.6* 76.8*  MCH 20.8* 21.2* 20.9*  MCHC 27.5* 28.0* 27.2*  RDW 19.1* 19.7* 21.0*  PLT 179 184 198    BNPNo results for input(s): BNP, PROBNP  in the last 168 hours.   DDimer  Recent Labs  Lab 11/15/19 1123  DDIMER 1.44*     Radiology    No results found.  Cardiac Studies    Patient Profile      Assessment & Plan    1.  Acute on chronic systolic CHF exacerbation: - he was continued on lasix 40 mg IV BID with good response. Consider transition back to oral tomorrow.  2.  Acute on chronic hypoxic respiratory failure/COPD: -Wears home supplemental O2 4 L nasal cannula. On PCP presentation, O2 saturations found to be in the mid 80s therefore he was transported to the ED for further evaluation -Plan was for PTA inhalers -Management per primary team  3.  Permanent atrial fibrillation status post AVN ablation: -Long history of permanent atrial fibrillation, status post AV and ablation/atrial appendage clipping per EP not on anticoagulation secondary to [place] -PTA metoprolol for rate control>>>currenlty on hold secondary to low normal BP  4.  Nonischemic cardiomyopathy s/p BiV ICD: -Previous echocardiogram 05/2019 with EF at 20 to 25% with most recent echocardiogram this  admission -ast echo in 11/2019 with an EF at 25-30% withglobal hypokinesis.  - On PTA ASA 81, atorvastatin 10, furosemide 40 twice daily with as needed dosing, metoprolol succinate 12.5 with no room for ARB/ARN I secondary to low normal BP.   -Cardiomyopathy felt to be end-stage as he was followed by palliative care medicine.  5.  History of MVR: -In the setting of mitral valve endocarditis found to be stable per last echocardiogram  6.  Thoracic aortic aneurysm: -Followed closely by Dr. Roxy Manns felt to be stable on last imaging with no plans for surgical intervention due to comorbid conditions and high risk status.  7.  History of non-small cell lung CA: -Status post wedge resection with no acute issues    For questions or updates, please contact [place] Please consult [email or web address] for contact info under        Signed, Elouise Munroe, MD  11/21/2019, 3:09 PM

## 2019-11-21 NOTE — Progress Notes (Signed)
PROGRESS NOTE  [name] KGU:[id number] DOB: 1946-06-14   PCP: [name], NP  Patient is from: Home  DOA: 11/13/2019 LOS: 8  Brief Narrative / Interim history: 74 year old male with history of CVA, ICH, HTN, HLD, type II DM, stage I lung cancer, aortic aneurysm, A. fib, chronic systolic CHF/ICD implant, LAA closure, chronic hypoxic respiratory failure on 4 LPM.  Presented with complaints of shortness of breath found to have acute on chronic systolic CHF.  Started on IV Lasix. Cardiology consulted later in the course.  Increased IV Lasix to 60 mg twice daily.   Subjective: Seen and examined earlier this morning.  No major events overnight of this morning.  No complaints.  He denies chest pain, dyspnea, GI or UTI symptoms.  Not a great historian although he is oriented x4.   Objective: Vitals:   11/21/19 0416 11/21/19 0732 11/21/19 0836 11/21/19 1114  BP: 111/67  106/60 104/64  Pulse: 73  74 74  Resp: 18  18 20   Temp: 98 F (36.7 C)   97.6 F (36.4 C)  TempSrc: Oral   Oral  SpO2: 93% 95% (!) 89% 90%  Weight: 71.8 kg     Height:        Intake/Output Summary (Last 24 hours) at 11/21/2019 1242 Last data filed at 11/21/2019 1151 Gross per 24 hour  Intake 1052 ml  Output 2450 ml  Net -1398 ml   Filed Weights   11/19/19 0130 11/20/19 0436 11/21/19 0416  Weight: 73.7 kg 72.8 kg 71.8 kg    Examination:  GENERAL: No apparent distress.  Nontoxic. HEENT: MMM.  Vision grossly intact.  Diminished hearing. NECK: Supple.  No apparent JVD.  RESP: 95% on 6 L.  No IWOB.  Fair aeration with bibasilar crackles. CVS:  RRR. Heart sounds normal.  ABD/GI/GU: BS+. Abd soft, NTND.  MSK/EXT:  Moves extremities.  2+ pitting edema.  TED hose in place. SKIN: no apparent skin lesion or wound NEURO: Awake, alert and oriented x4.  No apparent focal neuro deficit. PSYCH: Calm. Normal affect.   Procedures:  None  Microbiology summarized: 5/5-COVID-19 PCR negative. 5/5-influenza PCR  negative.  Assessment & Plan: Acute on chronic hypoxic respiratory failure-multifactorial including CHF exacerbation, pHTN and underlying COPD.  On 4 L at baseline.  Requiring 6 L -Treat treatable causes -Wean oxygen as able.  Minimum oxygen to keep saturation above 88%.  Discussed with RN. -Incentive spirometry/OOB  Acute on chronic systolic CHF/NICM s/p BiV-ICD/pHTN: Echo with EF of 25 to 30%, diffuse hypokinesis, severe LAE and RAE, moderately reduced RVSF and severely enlarged RV.  Fair response to IV Lasix.  2.1 L UOP/24 hours.  Net -4.3. Cr slightly up. -Appreciate cardiology input-on IV Lasix 60 mg twice daily -GDMT-Per cardiology. -Monitor fluid status, renal function and electrolytes.  Chronic COPD: Not in exacerbation. -Continue home meds  -Oxygen as above.  Permanent A. Fib. S/p AV ablation History of bioprosthetic MVR as well as LAA closure. -Not on any anticoagulation due to history of ICH. -Not taking any rate control medication for now. -Continue monitoring on telemetry  Thoracic aortic aneurysm-followed by Dr. Roxy Manns.  Felt to be stable with no plan for surgical intervention given comorbidities -Outpatient follow-up  Hyperbilirubinemia: T bili 2.1. -Fractionate  Iron deficiency anemia: Baseline Hgb 10-11>8.7 (4/29)> 9.3 (admit)>> 8.5.  Iron sat 3 with low ferritin and elevated TIBC. -IV Feraheme on 5/12.  Additional dose in 3 to 4 days -Monitor H&H  Uncontrolled DM-2 with hyperglycemia and hyperlipidemia: A1c  7.4%. Recent Labs    11/20/19 2126 11/21/19 0621 11/21/19 1113  GLUCAP 157* 131* 171*  -Continue SSI and statin.  History of non-small cell lung cancer. -SP wedge resection. -Outpatient follow-up  Concern about acute urinary retention on bladder scan: Patient refused in and out cath but seems to be voiding okay.  No suprapubic tenderness. -Discontinued Robaxin and oxycodone and scheduled Tylenol for pain -Added Flomax and bethanechol -Pelvic  ultrasound if remains a concern  Atypical chest pain: Denies chest pain now.  Troponin x3 not consistent with ACS. EKG nonischemic.  Denies chest pain today. -Continue scheduled Tylenol and as needed tramadol  Low back pain/left shoulder pain-lumbar spine, shoulder, clavicle and chest film negative for acute finding -X-ray lumbar spine shows evidence of arthritis.  No acute fracture. -Scheduled Tylenol 1 g 3 times daily   Near syncope-some concern about vasovagal -Continue monitoring -Check orthostatic vitals.  Debility/physical deconditioning: Wife concerned about providing care at home but patient is not willing to go to SNF. -Therapy recommended home health PT/OT with DME.   Goal of care/DNR/DNI-appropriate.           DVT prophylaxis: SCD due to"severe allergies to anticoagulants" Code Status: DNR/DNI Family Communication: Updated patient's wife over the phone on 5/13 Status is: Inpatient  Remains inpatient appropriate because:IV treatments appropriate due to intensity of illness or inability to take PO, Inpatient level of care appropriate due to severity of illness and Increased O2 requirement above baseline   Dispo: The patient is from: Home              Anticipated d/c is to: Home              Anticipated d/c date is: 2 days              Patient currently is not medically stable to d/c.        Consultants:  Cardiology   Sch Meds:  Scheduled Meds: . acetaminophen  1,000 mg Oral Q8H  . alum & mag hydroxide-simeth  30 mL Oral BID  . aspirin EC  81 mg Oral Daily  . atorvastatin  10 mg Oral Daily  . bethanechol  25 mg Oral TID  . furosemide  40 mg Intravenous BID  . insulin aspart  0-5 Units Subcutaneous QHS  . insulin aspart  0-9 Units Subcutaneous TID WC  . insulin glargine  5 Units Subcutaneous Daily  . lidocaine  1 application Urethral Once  . mouth rinse  15 mL Mouth Rinse BID  . mometasone-formoterol  2 puff Inhalation BID  . tamsulosin  0.4 mg  Oral QPC supper  . vitamin B-12  1,000 mcg Oral Daily   Continuous Infusions: PRN Meds:.albuterol, nitroGLYCERIN, senna-docusate, traMADol  Antimicrobials: Anti-infectives (From admission, onward)   None      I have personally reviewed the following labs and images: CBC: Recent Labs  Lab 11/16/19 0818 11/17/19 0432 11/18/19 0548 11/19/19 0507 11/20/19 0820  WBC 6.5 7.8 6.6 6.8 7.2  NEUTROABS  --   --   --   --  5.9  HGB 8.2* 7.9* 8.2* 8.6* 8.5*  HCT 29.6* 28.2* 29.8* 30.7* 31.2*  MCV 74.6* 74.0* 75.4* 75.6* 76.8*  PLT 180 163 179 184 198   BMP &GFR Recent Labs  Lab 11/17/19 0432 11/18/19 0548 11/19/19 0507 11/20/19 0820 11/21/19 0735  NA 131* 134* 132* 133* 138  K 3.4* 3.8 3.8 3.3* 3.8  CL 99 99 95* 96* 100  CO2 27 26 24  27 28  GLUCOSE 156* 140* 131* 212* 153*  BUN 15 14 18 17 17   CREATININE 0.95 1.04 0.87 0.99 1.09  CALCIUM 8.1* 8.3* 8.4* 8.2* 8.7*  MG  --   --   --  1.8 1.9  PHOS  --   --   --   --  3.3   Estimated Creatinine Clearance: 62.2 mL/min (by C-G formula based on SCr of 1.09 mg/dL). Liver & Pancreas: Recent Labs  Lab 11/20/19 0820 11/21/19 0735  AST 12* 14*  ALT 15 16  ALKPHOS 64 66  BILITOT 1.7* 2.1*  PROT 5.6* 6.2*  ALBUMIN 2.7* 3.0*   No results for input(s): LIPASE, AMYLASE in the last 168 hours. No results for input(s): AMMONIA in the last 168 hours. Diabetic: No results for input(s): HGBA1C in the last 72 hours. Recent Labs  Lab 11/20/19 1154 11/20/19 1630 11/20/19 2126 11/21/19 0621 11/21/19 1113  GLUCAP 158* 142* 157* 131* 171*   Cardiac Enzymes: No results for input(s): CKTOTAL, CKMB, CKMBINDEX, TROPONINI in the last 168 hours. No results for input(s): PROBNP in the last 8760 hours. Coagulation Profile: No results for input(s): INR, PROTIME in the last 168 hours. Thyroid Function Tests: No results for input(s): TSH, T4TOTAL, FREET4, T3FREE, THYROIDAB in the last 72 hours. Lipid Profile: No results for input(s):  CHOL, HDL, LDLCALC, TRIG, CHOLHDL, LDLDIRECT in the last 72 hours. Anemia Panel: No results for input(s): VITAMINB12, FOLATE, FERRITIN, TIBC, IRON, RETICCTPCT in the last 72 hours. Urine analysis:    Component Value Date/Time   COLORURINE YELLOW 06/07/2019 1231   APPEARANCEUR CLOUDY (A) 06/07/2019 1231   LABSPEC 1.013 06/07/2019 1231   PHURINE 5.0 06/07/2019 1231   GLUCOSEU 150 (A) 06/07/2019 1231   HGBUR SMALL (A) 06/07/2019 1231   HGBUR negative 04/05/2010 0837   BILIRUBINUR NEGATIVE 06/07/2019 1231   BILIRUBINUR n 03/27/2012 1323   KETONESUR NEGATIVE 06/07/2019 1231   PROTEINUR NEGATIVE 06/07/2019 1231   UROBILINOGEN 1.0 11/28/2013 1000   NITRITE NEGATIVE 06/07/2019 1231   LEUKOCYTESUR LARGE (A) 06/07/2019 1231   Sepsis Labs: Invalid input(s): PROCALCITONIN, [place]  Microbiology: Recent Results (from the past 240 hour(s))  Respiratory Panel by RT PCR (Flu A&B, Covid) - Nasopharyngeal Swab     Status: None   Collection Time: 11/13/19  6:27 PM   Specimen: Nasopharyngeal Swab  Result Value Ref Range Status   SARS Coronavirus 2 by RT PCR NEGATIVE NEGATIVE Final    Comment: (NOTE) SARS-CoV-2 target nucleic acids are NOT DETECTED. The SARS-CoV-2 RNA is generally detectable in upper respiratoy specimens during the acute phase of infection. The lowest concentration of SARS-CoV-2 viral copies this assay can detect is 131 copies/mL. A negative result does not preclude SARS-Cov-2 infection and should not be used as the sole basis for treatment or other patient management decisions. A negative result may occur with  improper specimen collection/handling, submission of specimen other than nasopharyngeal swab, presence of viral mutation(s) within the areas targeted by this assay, and inadequate number of viral copies (<131 copies/mL). A negative result must be combined with clinical observations, patient history, and epidemiological information. The expected result is  Negative. Fact Sheet for Patients:  PinkCheek.be Fact Sheet for Healthcare Providers:  GravelBags.it This test is not yet ap proved or cleared by the [place] FDA and  has been authorized for detection and/or diagnosis of SARS-CoV-2 by FDA under an Emergency Use Authorization (EUA). This EUA will remain  in effect (meaning this test can be used) for the  duration of the COVID-19 declaration under Section 564(b)(1) of the Act, 21 U.S.C. section 360bbb-3(b)(1), unless the authorization is terminated or revoked sooner.    Influenza A by PCR NEGATIVE NEGATIVE Final   Influenza B by PCR NEGATIVE NEGATIVE Final    Comment: (NOTE) The Xpert Xpress SARS-CoV-2/FLU/RSV assay is intended as an aid in  the diagnosis of influenza from Nasopharyngeal swab specimens and  should not be used as a sole basis for treatment. Nasal washings and  aspirates are unacceptable for Xpert Xpress SARS-CoV-2/FLU/RSV  testing. Fact Sheet for Patients: PinkCheek.be Fact Sheet for Healthcare Providers: GravelBags.it This test is not yet approved or cleared by the [place] FDA and  has been authorized for detection and/or diagnosis of SARS-CoV-2 by  FDA under an Emergency Use Authorization (EUA). This EUA will remain  in effect (meaning this test can be used) for the duration of the  Covid-19 declaration under Section 564(b)(1) of the Act, 21  U.S.C. section 360bbb-3(b)(1), unless the authorization is  terminated or revoked. Performed at [facility], [place] [address], [place], [place] [address]     Radiology Studies: No results found.     Jaevin Medearis T. [place]  If 7PM-7AM, please contact night-coverage [email or web address] Password [facility] 11/21/2019, 12:42 PM

## 2019-11-22 ENCOUNTER — Ambulatory Visit: Payer: Medicare Other | Admitting: Primary Care

## 2019-11-22 LAB — RENAL FUNCTION PANEL
Albumin: 2.5 g/dL — ABNORMAL LOW (ref 3.5–5.0)
Anion gap: 8 (ref 5–15)
BUN: 16 mg/dL (ref 8–23)
CO2: 27 mmol/L (ref 22–32)
Calcium: 8.4 mg/dL — ABNORMAL LOW (ref 8.9–10.3)
Chloride: 102 mmol/L (ref 98–111)
Creatinine, Ser: 0.86 mg/dL (ref 0.61–1.24)
GFR calc Af Amer: >60 mL/min (ref >60)
GFR calc non Af Amer: >60 mL/min (ref >60)
Glucose, Bld: 118 mg/dL — ABNORMAL HIGH (ref 70–99)
Phosphorus: 3.5 mg/dL (ref 2.5–4.6)
Potassium: 3.6 mmol/L (ref 3.5–5.1)
Sodium: 137 mmol/L (ref 135–145)

## 2019-11-22 LAB — GLUCOSE, CAPILLARY
Glucose-Capillary: 118 mg/dL — ABNORMAL HIGH (ref 70–99)
Glucose-Capillary: 164 mg/dL — ABNORMAL HIGH (ref 70–99)
Glucose-Capillary: 94 mg/dL (ref 70–99)
Glucose-Capillary: 150 mg/dL — ABNORMAL HIGH (ref 70–99)

## 2019-11-22 LAB — BILIRUBIN, FRACTIONATED(TOT/DIR/INDIR)
Bilirubin, Direct: 0.3 mg/dL — ABNORMAL HIGH (ref 0.0–0.2)
Indirect Bilirubin: 1.2 mg/dL — ABNORMAL HIGH (ref 0.3–0.9)
Total Bilirubin: 1.5 mg/dL — ABNORMAL HIGH (ref 0.3–1.2)

## 2019-11-22 LAB — MAGNESIUM: Magnesium: 2.1 mg/dL (ref 1.7–2.4)

## 2019-11-22 MED ORDER — POTASSIUM CHLORIDE CRYS ER 20 MEQ PO TBCR
40.0000 meq | EXTENDED_RELEASE_TABLET | Freq: Once | ORAL | Status: AC
  Administered 2019-11-22: 40 meq via ORAL
  Filled 2019-11-22: qty 2

## 2019-11-22 MED ORDER — BETHANECHOL CHLORIDE 10 MG PO TABS
10.0000 mg | ORAL_TABLET | Freq: Three times a day (TID) | ORAL | Status: DC
  Administered 2019-11-22 – 2019-11-25 (×9): 10 mg via ORAL
  Filled 2019-11-22 (×11): qty 1

## 2019-11-22 NOTE — Progress Notes (Signed)
Attempted to wean patient O2 down. Patient resting in bed, 96% on 6 Liters.  Turned O2 down to 5L, patient desaturated to 87% at rest. Helped patient to change position, encouraged pursed lip breathing with no change. Patient's meal arrived and patient began to eat with desaturation to 86%. Turned O2 back to 6L, saturations returning to 91%. Patient denying SOB or chest pain at this time.

## 2019-11-22 NOTE — Progress Notes (Signed)
PROGRESS NOTE  [name] DGU:[id number] DOB: [birth year]   PCP: [name], NP  Patient is from: Home  DOA: 11/13/2019 LOS: 36  Brief Narrative / Interim history: 74 year old male with history of CVA, ICH, HTN, HLD, type II DM, stage I lung cancer, aortic aneurysm, A. fib, chronic systolic CHF/ICD implant, LAA closure, chronic hypoxic respiratory failure on 4 LPM.  Presented with complaints of shortness of breath found to have acute on chronic systolic CHF.  Started on IV Lasix. Cardiology consulted later in the course.  Increased IV Lasix to 60 mg twice daily.  Now on IV Lasix 40 mg twice daily.  Subjective: Seen and examined earlier this morning.  No major events overnight or this morning.  He has no complaints but not a great historian.  He denies chest pain, dyspnea, GI or UTI symptoms.  He questions when he could go home.  He continues to require 5 to 6 L to maintain saturation above 90%.  Objective: Vitals:   11/22/19 0500 11/22/19 0754 11/22/19 0948 11/22/19 1150  BP:   108/67 117/71  Pulse:   73 75  Resp:   16 18  Temp:   (!) 97.5 F (36.4 C) 98.2 F (36.8 C)  TempSrc:   Oral Oral  SpO2:  93% 92% 93%  Weight: 71.4 kg     Height:        Intake/Output Summary (Last 24 hours) at 11/22/2019 1220 Last data filed at 11/22/2019 1153 Gross per 24 hour  Intake 1120 ml  Output 2075 ml  Net -955 ml   Filed Weights   11/20/19 0436 11/21/19 0416 11/22/19 0500  Weight: 72.8 kg 71.8 kg 71.4 kg    Examination:  GENERAL: No apparent distress.  Nontoxic. HEENT: MMM.  Vision and hearing grossly intact.  NECK: Supple.  No apparent JVD.  RESP: 92% on 6 L.  No IWOB.  Fair aeration.  Bibasilar crackles. CVS:  RRR. Heart sounds normal.  ABD/GI/GU: BS+. Abd soft, NTND.  MSK/EXT:  Moves extremities.  2+ pitting edema.  TED hose in place. SKIN: no apparent skin lesion or wound NEURO: Awake, alert and oriented fairly.  No apparent focal neuro deficit. PSYCH: Calm. Normal affect.   Limited insight.  Procedures:  None  Microbiology summarized: 5/5-COVID-19 PCR negative. 5/5-influenza PCR negative.  Assessment & Plan: Acute on chronic hypoxic respiratory failure-multifactorial including CHF exacerbation, pHTN and underlying COPD.  On 4 L at baseline.  Requiring 6 L -Treat treatable causes -Wean oxygen as able.  Minimum oxygen to keep saturation above 88%.  Discussed with RN. -Incentive spirometry/OOB/PT/OT  Acute on chronic systolic CHF/NICM s/p BiV-ICD/pHTN: Echo with EF of 25 to 30%, diffuse hypokinesis, severe LAE and RAE, moderately reduced RVSF and severely enlarged RV.  Fair response to IV Lasix.  UOP 2 L / 24 hours.  Net -5.5 L. Cr stable.  Still with high O2 requirement and significant edema. -Continue IV Lasix 40 mg twice daily -GDMT-Per cardiology. -Monitor fluid status, renal function and electrolytes.  Chronic COPD: Not in exacerbation. -Continue home meds  -Oxygen as above.  Permanent A. Fib. S/p AV ablation History of bioprosthetic MVR as well as LAA closure. -Not on any anticoagulation due to history of ICH. -Not taking any rate control medication for now. -Continue monitoring on telemetry  Thoracic aortic aneurysm-followed by Dr. Roxy Manns.  Felt to be stable with no plan for surgical intervention given comorbidities -Outpatient follow-up  Hyperbilirubinemia: T bili 2.1> 1.5.  Indirect 1.2.  H&H stable. -  Continue monitoring  Iron deficiency anemia: Baseline Hgb 10-11>8.7 (4/29)> 9.3 (admit)>> 8.5.  Iron sat 3 with low ferritin and elevated TIBC. -IV Feraheme on 5/12.  Additional dose on 5/15 -Monitor H&H  Uncontrolled DM-2 with hyperglycemia and hyperlipidemia: A1c 7.4%. Recent Labs    11/21/19 2143 11/22/19 0618 11/22/19 1152  GLUCAP 148* 118* 164*  -Continue SSI and statin.  History of non-small cell lung cancer. -SP wedge resection. -Outpatient follow-up  Concern about acute urinary retention on bladder scan: Patient  refused in and out cath but seems to be voiding okay.  No suprapubic tenderness. -Discontinued Robaxin and oxycodone and scheduled Tylenol for pain -Added Flomax and bethanechol-reduce bethanechol to 10 mg 3 times daily -Pelvic ultrasound if remains a concern  Atypical chest pain: Denies chest pain now.  Troponin x3 not consistent with ACS. EKG nonischemic.  Denies chest pain today. -Continue scheduled Tylenol and as needed tramadol  Low back pain/left shoulder pain-lumbar spine, shoulder, clavicle and chest film negative for acute finding -X-ray lumbar spine shows evidence of arthritis.  No acute fracture. -Scheduled Tylenol 1 g 3 times daily  Near syncope-some concern about vasovagal -Continue monitoring -Check orthostatic vitals. -Continue TED hose  Debility/physical deconditioning: Wife concerned about providing care at home but patient is not willing to go to SNF. -Therapy recommended home health PT/OT with DME.   Goal of care/DNR/DNI-appropriate.           DVT prophylaxis: SCD due to"severe allergies to anticoagulants" Code Status: DNR/DNI Family Communication: Updated patient's wife over the phone on 5/13 Status is: Inpatient  Remains inpatient appropriate because:IV treatments appropriate due to intensity of illness or inability to take PO, Inpatient level of care appropriate due to severity of illness and Increased O2 requirement above baseline   Dispo: The patient is from: Home              Anticipated d/c is to: Home              Anticipated d/c date is: 1 day              Patient currently is not medically stable to d/c.        Consultants:  Cardiology   Sch Meds:  Scheduled Meds: . acetaminophen  1,000 mg Oral Q8H  . alum & mag hydroxide-simeth  30 mL Oral BID  . aspirin EC  81 mg Oral Daily  . atorvastatin  10 mg Oral Daily  . bethanechol  25 mg Oral TID  . furosemide  40 mg Intravenous BID  . insulin aspart  0-5 Units Subcutaneous QHS  .  insulin aspart  0-9 Units Subcutaneous TID WC  . insulin glargine  5 Units Subcutaneous Daily  . lidocaine  1 application Urethral Once  . mouth rinse  15 mL Mouth Rinse BID  . mometasone-formoterol  2 puff Inhalation BID  . tamsulosin  0.4 mg Oral QPC supper  . vitamin B-12  1,000 mcg Oral Daily   Continuous Infusions: PRN Meds:.albuterol, nitroGLYCERIN, senna-docusate, traMADol  Antimicrobials: Anti-infectives (From admission, onward)   None      I have personally reviewed the following labs and images: CBC: Recent Labs  Lab 11/16/19 0818 11/17/19 0432 11/18/19 0548 11/19/19 0507 11/20/19 0820  WBC 6.5 7.8 6.6 6.8 7.2  NEUTROABS  --   --   --   --  5.9  HGB 8.2* 7.9* 8.2* 8.6* 8.5*  HCT 29.6* 28.2* 29.8* 30.7* 31.2*  MCV 74.6* 74.0* 75.4* 75.6* 76.8*  PLT 180 163 179 184 198   BMP &GFR Recent Labs  Lab 11/18/19 0548 11/19/19 0507 11/20/19 0820 11/21/19 0735 11/22/19 0625  NA 134* 132* 133* 138 137  K 3.8 3.8 3.3* 3.8 3.6  CL 99 95* 96* 100 102  CO2 26 24 27 28 27   GLUCOSE 140* 131* 212* 153* 118*  BUN 14 18 17 17 16   CREATININE 1.04 0.87 0.99 1.09 0.86  CALCIUM 8.3* 8.4* 8.2* 8.7* 8.4*  MG  --   --  1.8 1.9 2.1  PHOS  --   --   --  3.3 3.5   Estimated Creatinine Clearance: 78.4 mL/min (by C-G formula based on SCr of 0.86 mg/dL). Liver & Pancreas: Recent Labs  Lab 11/20/19 0820 11/21/19 0735 11/22/19 0625  AST 12* 14*  --   ALT 15 16  --   ALKPHOS 64 66  --   BILITOT 1.7* 2.1* 1.5*  PROT 5.6* 6.2*  --   ALBUMIN 2.7* 3.0* 2.5*   No results for input(s): LIPASE, AMYLASE in the last 168 hours. No results for input(s): AMMONIA in the last 168 hours. Diabetic: No results for input(s): HGBA1C in the last 72 hours. Recent Labs  Lab 11/21/19 1113 11/21/19 1620 11/21/19 2143 11/22/19 0618 11/22/19 1152  GLUCAP 171* 112* 148* 118* 164*   Cardiac Enzymes: No results for input(s): CKTOTAL, CKMB, CKMBINDEX, TROPONINI in the last 168 hours. No  results for input(s): PROBNP in the last 8760 hours. Coagulation Profile: No results for input(s): INR, PROTIME in the last 168 hours. Thyroid Function Tests: No results for input(s): TSH, T4TOTAL, FREET4, T3FREE, THYROIDAB in the last 72 hours. Lipid Profile: No results for input(s): CHOL, HDL, LDLCALC, TRIG, CHOLHDL, LDLDIRECT in the last 72 hours. Anemia Panel: No results for input(s): VITAMINB12, FOLATE, FERRITIN, TIBC, IRON, RETICCTPCT in the last 72 hours. Urine analysis:    Component Value Date/Time   COLORURINE YELLOW 06/07/2019 1231   APPEARANCEUR CLOUDY (A) 06/07/2019 1231   LABSPEC 1.013 06/07/2019 1231   PHURINE 5.0 06/07/2019 1231   GLUCOSEU 150 (A) 06/07/2019 1231   HGBUR SMALL (A) 06/07/2019 1231   HGBUR negative 04/05/2010 0837   BILIRUBINUR NEGATIVE 06/07/2019 1231   BILIRUBINUR n 03/27/2012 1323   KETONESUR NEGATIVE 06/07/2019 1231   PROTEINUR NEGATIVE 06/07/2019 1231   UROBILINOGEN 1.0 11/28/2013 1000   NITRITE NEGATIVE 06/07/2019 1231   LEUKOCYTESUR LARGE (A) 06/07/2019 1231   Sepsis Labs: Invalid input(s): PROCALCITONIN, [place]  Microbiology: Recent Results (from the past 240 hour(s))  Respiratory Panel by RT PCR (Flu A&B, Covid) - Nasopharyngeal Swab     Status: None   Collection Time: 11/13/19  6:27 PM   Specimen: Nasopharyngeal Swab  Result Value Ref Range Status   SARS Coronavirus 2 by RT PCR NEGATIVE NEGATIVE Final    Comment: (NOTE) SARS-CoV-2 target nucleic acids are NOT DETECTED. The SARS-CoV-2 RNA is generally detectable in upper respiratoy specimens during the acute phase of infection. The lowest concentration of SARS-CoV-2 viral copies this assay can detect is 131 copies/mL. A negative result does not preclude SARS-Cov-2 infection and should not be used as the sole basis for treatment or other patient management decisions. A negative result may occur with  improper specimen collection/handling, submission of specimen other than  nasopharyngeal swab, presence of viral mutation(s) within the areas targeted by this assay, and inadequate number of viral copies (<131 copies/mL). A negative result must be combined with clinical observations, patient history, and epidemiological information. The expected result is  Negative. Fact Sheet for Patients:  PinkCheek.be Fact Sheet for Healthcare Providers:  GravelBags.it This test is not yet ap proved or cleared by the [place] FDA and  has been authorized for detection and/or diagnosis of SARS-CoV-2 by FDA under an Emergency Use Authorization (EUA). This EUA will remain  in effect (meaning this test can be used) for the duration of the COVID-19 declaration under Section 564(b)(1) of the Act, 21 U.S.C. section 360bbb-3(b)(1), unless the authorization is terminated or revoked sooner.    Influenza A by PCR NEGATIVE NEGATIVE Final   Influenza B by PCR NEGATIVE NEGATIVE Final    Comment: (NOTE) The Xpert Xpress SARS-CoV-2/FLU/RSV assay is intended as an aid in  the diagnosis of influenza from Nasopharyngeal swab specimens and  should not be used as a sole basis for treatment. Nasal washings and  aspirates are unacceptable for Xpert Xpress SARS-CoV-2/FLU/RSV  testing. Fact Sheet for Patients: PinkCheek.be Fact Sheet for Healthcare Providers: GravelBags.it This test is not yet approved or cleared by the [place] FDA and  has been authorized for detection and/or diagnosis of SARS-CoV-2 by  FDA under an Emergency Use Authorization (EUA). This EUA will remain  in effect (meaning this test can be used) for the duration of the  Covid-19 declaration under Section 564(b)(1) of the Act, 21  U.S.C. section 360bbb-3(b)(1), unless the authorization is  terminated or revoked. Performed at [facility], [place] [address], [place], [place] [address]      Radiology Studies: No results found.     Taye T. [place]  If 7PM-7AM, please contact night-coverage [email or web address] Password [facility] 11/22/2019, 12:20 PM

## 2019-11-22 NOTE — Plan of Care (Signed)
  Problem: Education: Goal: Knowledge of General Education information will improve Description: Including pain rating scale, medication(s)/side effects and non-pharmacologic comfort measures Outcome: Progressing   Problem: Education: Goal: Ability to demonstrate management of disease process will improve Outcome: Progressing Goal: Ability to verbalize understanding of medication therapies will improve Outcome: Progressing  Patient turned down to 5L O2, saturating at 89-90%, denies distress or SOB. O2 turned up to 6L, saturations improving to 91-92 %.

## 2019-11-22 NOTE — Progress Notes (Signed)
Discussed with Dr. Cyndia Skeeters 2 attempts to wean patient down on O2 and episode of 11 beats vtach reported by tele.

## 2019-11-22 NOTE — Progress Notes (Addendum)
Heart Failure Stewardship Pharmacist Progress Note   PCP: [name], NP PCP-Cardiologist: Candee Furbish, MD    HPI:  74 yo M with PMH significant of chronic hypoxemic respiratory failure on 4 L of home oxygen, systolic heart failure, history of CVA, ICH, HTN, HLD, T2 DM, lung cancer stage Ia, thoracic aortic aneurysm, permanent atrial fibrillation s/p ICD, LAA closure who presented to ED from PCP office on 5/5 with worsening shortness of breath and edema.  ECHO on 11/14/19 show LVEF of 25-30% with worsening diffuse hypokinesis.   Patient has been receiving IV diuresis throughout admission. Good urine output yesterday. Weight down another 1 lb.    Current HF Medications: Furosemide 40 mg IV BID  Prior to admission HF Medications: Furosemide 40 mg daily Metoprolol succinate - not taking   Pertinent Lab Values: . Serum creatinine 0.86, BUN 16, Potassium 3.6, Sodium 137, BNP 1448.6, Magnesium 2.1  Vital Signs: . Weight: 157 lb (estimated dry weight: ~155 lbs) . Blood pressure: 110/60s  . Heart rate: 70s  Medication Assistance / Insurance Benefits Check: Does the patient have prescription insurance?   yes Type of insurance plan: Medicare + [place]  Does the patient qualify for medication assistance through manufacturers or grants?   Pending household income information  Eligible grants and/or patient assistance programs: pending  Medication assistance applications in progress: none  Medication assistance applications approved: none   Approved medication assistance renewals will be completed by: Dr. Marlou Porch office  Outpatient Pharmacy:  Prior to admission outpatient pharmacy: [facility] Is the patient willing to use [place] at discharge?  No Is the patient willing to transition their outpatient pharmacy to utilize a [facility] outpatient pharmacy?  No    Assessment: 1. Chronic systolic CHF (EF 37-34%). NYHA class II/III symptoms. [place]  volume overloaded with 2+ pitting edema on MD exam. - Continue furosemide 40 mg IV BID - diuresed well yesterday - Restart metoprolol succinate when able - Consider low dose ACE/ARB - limited in the past due to orthostatic hypotension - Consider adding spironolactone  - Feraheme x 1 given on 5/12   Plan: 1) Medication changes recommended at this time: - None - BP soft with IV diuresis. Can trial low dose ACE/ARB or spironolactone once off IV furosemide.  2) Patient assistance application(s): - None at this time  3)  Education  - Patient has been educated on current HF medications: furosemide + anticipated medications at discharge or TOC follow up - ACEI/ARB/ARNI, spironolactone, BB, SGLT2i -Patient verbalizes understanding that over the next few months, these medication doses may change and more medications may be added to optimize HF regimen -Patient has been educated on basic disease state pathophysiology and goals of therapy -Time spent (98min)   Vertis Kelch, PharmD, BCPS Heart Failure Stewardship Pharmacist Phone [phone] 11/22/2019       8:35 AM

## 2019-11-23 DIAGNOSIS — I4821 Permanent atrial fibrillation: Secondary | ICD-10-CM

## 2019-11-23 DIAGNOSIS — I5023 Acute on chronic systolic (congestive) heart failure: Secondary | ICD-10-CM

## 2019-11-23 DIAGNOSIS — J9621 Acute and chronic respiratory failure with hypoxia: Secondary | ICD-10-CM

## 2019-11-23 LAB — RENAL FUNCTION PANEL
Albumin: 2.6 g/dL — ABNORMAL LOW (ref 3.5–5.0)
Anion gap: 7 (ref 5–15)
BUN: 14 mg/dL (ref 8–23)
CO2: 30 mmol/L (ref 22–32)
Calcium: 8.4 mg/dL — ABNORMAL LOW (ref 8.9–10.3)
Chloride: 99 mmol/L (ref 98–111)
Creatinine, Ser: 0.83 mg/dL (ref 0.61–1.24)
GFR calc Af Amer: >60 mL/min (ref >60)
GFR calc non Af Amer: >60 mL/min (ref >60)
Glucose, Bld: 111 mg/dL — ABNORMAL HIGH (ref 70–99)
Phosphorus: 3.7 mg/dL (ref 2.5–4.6)
Potassium: 4.1 mmol/L (ref 3.5–5.1)
Sodium: 136 mmol/L (ref 135–145)

## 2019-11-23 LAB — CBC
HCT: 32.3 % — ABNORMAL LOW (ref 39.0–52.0)
Hemoglobin: 8.7 g/dL — ABNORMAL LOW (ref 13.0–17.0)
MCH: 21.4 pg — ABNORMAL LOW (ref 26.0–34.0)
MCHC: 26.9 g/dL — ABNORMAL LOW (ref 30.0–36.0)
MCV: 79.6 fL — ABNORMAL LOW (ref 80.0–100.0)
Platelets: 222 10*3/uL (ref 150–400)
RBC: 4.06 MIL/uL — ABNORMAL LOW (ref 4.22–5.81)
RDW: 24 % — ABNORMAL HIGH (ref 11.5–15.5)
WBC: 5.9 10*3/uL (ref 4.0–10.5)
nRBC: 0 % (ref 0.0–0.2)

## 2019-11-23 LAB — GLUCOSE, CAPILLARY
Glucose-Capillary: 113 mg/dL — ABNORMAL HIGH (ref 70–99)
Glucose-Capillary: 195 mg/dL — ABNORMAL HIGH (ref 70–99)
Glucose-Capillary: 98 mg/dL (ref 70–99)
Glucose-Capillary: 141 mg/dL — ABNORMAL HIGH (ref 70–99)

## 2019-11-23 LAB — MAGNESIUM: Magnesium: 2.1 mg/dL (ref 1.7–2.4)

## 2019-11-23 MED ORDER — SODIUM CHLORIDE 0.9 % IV SOLN: 510.0000 mg | Freq: Once | INTRAVENOUS | Status: DC

## 2019-11-23 NOTE — Plan of Care (Signed)
  Problem: Education: Goal: Knowledge of General Education information will improve Description: Including pain rating scale, medication(s)/side effects and non-pharmacologic comfort measures Outcome: Progressing   Problem: Education: Goal: Ability to demonstrate management of disease process will improve Outcome: Progressing   Problem: Activity: Goal: Capacity to carry out activities will improve Outcome: Progressing  Patient requiring 6L O2, denies SOB when up to bathroom. Patient states he is not sure if his O2 equipment at home can provide 6 L, it may only provide 5L maximum.

## 2019-11-23 NOTE — Progress Notes (Signed)
Charles NOTE  CEBERT DETTMANN VOZ:[id number] DOB: [birth year]   PCP: Dorothyann [name], [name]  Brief Narrative / Interim history: 74 year old male with history of CVA, ICH, HTN, HLD, type II DM, stage I lung cancer, aortic aneurysm, A. fib, chronic systolic CHF/ICD implant, LAA closure, chronic hypoxic respiratory failure on [name] LPM.  Presented with complaints of shortness of breath found to have acute on chronic systolic CHF.  Started on IV Lasix. Cardiology consulted later in the course.  Increased IV Lasix to 60 mg twice daily.  Now on IV Lasix 40 mg twice daily.  Cardiology following and recommended palliative care consult given poor prognosis.  Subjective: Seen and examined earlier this morning.  No major events overnight of this morning.  No complaints but not a great historian.  He denies chest pain, shortness of breath, GI or UTI symptoms.  Objective: Vitals:   11/22/19 2013 11/23/19 0534 11/23/19 0830 11/23/19 1148  BP: (!) 91/52 (!) 104/59 (!) 106/55 116/73  Pulse: 71 75  73  Resp: 18 18 20 18   Temp: 98.3 F (36.8 C) 97.7 F (36.5 C) 98.2 F (36.8 C) 97.7 F (36.5 C)  TempSrc: Oral Oral Oral Oral  SpO2: 93% 95% 93% 99%  Weight:  71.3 kg    Height:        Intake/Output Summary (Last 24 hours) at 11/23/2019 1212 Last data filed at 11/23/2019 1000 Gross per 24 hour  Intake 2120 ml  Output 2100 ml  Net 20 ml   Filed Weights   11/21/19 0416 11/22/19 0500 11/23/19 0534  Weight: 71.8 kg 71.[name] kg 71.3 kg    Examination:  GENERAL: No apparent distress.  Nontoxic. HEENT: MMM.  Vision grossly intact.  Diminished hearing NECK: Supple.  No apparent JVD.  RESP: 95% on 6 L no IWOB.  Fair aeration with rhonchi. CVS:  RRR. Heart sounds normal.  ABD/GI/GU: BS+. Abd soft, NTND.  MSK/EXT:  Moves extremities.  2+ pitting edema through TED hose SKIN: no apparent skin lesion or wound NEURO: Awake, alert and oriented appropriately.  No  apparent focal neuro deficit. PSYCH: Calm. Normal affect.  Limited insight.  Procedures:  None  Microbiology summarized: 5/5-COVID-19 PCR negative. 5/5-influenza PCR negative.  Assessment & Plan: Acute on chronic hypoxic respiratory failure-multifactorial including CHF exacerbation, pHTN and underlying COPD.  On [name] L at baseline.  Requiring 6 L.  Difficult to wean. -Wean oxygen as able.  Minimum oxygen to keep saturation above 88%.  Discussed with RN. -Incentive spirometry/OOB/PT/OT -Palliative care consulted  Acute on chronic systolic CHF/NICM s/p BiV-ICD/pHTN: Echo with EF of 25 to 30%, diffuse hypokinesis, severe LAE and RAE, moderately reduced RVSF and severely enlarged RV.  Good UOP with IV Lasix, 2.8 L / 24 hours, -6 L.  BP and creatinine is stable but continues to require 6 L.  Still with significant edema. -Appreciate cardiology's help -Continue IV Lasix 40 mg twice daily -GDMT-Per cardiology. -Monitor fluid status, renal function and electrolytes.  Advanced COPD: Chronic.  Seems to be playing a huge role in his O2 requirement. -Continue home meds  -Oxygen as above.  Permanent A. Fib. S/p AV ablation History of bioprosthetic MVR as well as LAA closure. -Not on any anticoagulation due to history of ICH. -Not taking any rate control medication for now. -Continue monitoring on telemetry  Thoracic aortic aneurysm-followed by Dr. Roxy Manns.  Felt to be stable with no plan for surgical intervention given comorbidities -Outpatient follow-up  Hyperbilirubinemia: T bili 2.1> 1.5.  Indirect 1.2.  H&H stable. -Continue monitoring  Iron deficiency anemia: Baseline Hgb 10-11>8.7 ([name]/29)> 9.3 (admit)>> 8.5.  Iron sat 3 with low ferritin and elevated TIBC. -IV Feraheme on 5/8 and 5/12. -Monitor H&H  Uncontrolled DM-2 with hyperglycemia and hyperlipidemia: A1c 7.[name]%. Recent Labs    11/22/19 2124 11/23/19 0604 11/23/19 1145  GLUCAP 150* 113* 195*  -Continue SSI and  statin.  History of non-small cell lung cancer. -SP wedge resection. -Outpatient follow-up  Concern about acute urinary retention on bladder scan: refused in and out cath but seems to be voiding okay.  No suprapubic tenderness. -Discontinued Robaxin and oxycodone and scheduled Tylenol for pain -Continue Flomax and bethanechol  Atypical chest pain: Resolved.  Troponin x3 not consistent with ACS. EKG nonischemic.  Denies chest pain today. -Continue scheduled Tylenol and as needed tramadol  Low back pain/left shoulder pain-lumbar spine, shoulder, clavicle and chest film negative for acute finding.  Patient denies pain now. -X-ray lumbar spine shows evidence of arthritis.  No acute fracture.  -Scheduled Tylenol 1 g 3 times daily  Near syncope-some concern about vasovagal -Continue monitoring -Continue TED hose  Debility/physical deconditioning: Wife concerned about providing care at home -Therapy recommended home health PT/OT with DME. -Palliative care consulted   Goal of care/DNR/DNI-patient with significant cardiopulmonary comorbidities.  Poor prognosis.  Could benefit from hospice. -Consult palliative care           DVT prophylaxis: SCD due to"severe allergies to anticoagulants" Code Status: DNR/DNI Family Communication: Updated patient's wife over the phone on 5/13 Status is: Inpatient  Remains inpatient appropriate because:IV treatments appropriate due to intensity of illness or inability to take PO, Inpatient level of care appropriate due to severity of illness and Increased O2 requirement above baseline   Dispo: The patient is from: Home              Anticipated d/c is to: Home              Anticipated d/c date is: 2 days              Patient currently is not medically stable to d/c.        Consultants:  Cardiology   Sch Meds:  Scheduled Meds: . acetaminophen  1,000 mg Oral Q8H  . alum & mag hydroxide-simeth  30 mL Oral BID  . aspirin EC  81 mg Oral  Daily  . atorvastatin  10 mg Oral Daily  . bethanechol  10 mg Oral TID  . furosemide  40 mg Intravenous BID  . insulin aspart  0-5 Units Subcutaneous QHS  . insulin aspart  0-9 Units Subcutaneous TID WC  . insulin glargine  5 Units Subcutaneous Daily  . lidocaine  1 application Urethral Once  . mouth rinse  15 mL Mouth Rinse BID  . mometasone-formoterol  2 puff Inhalation BID  . tamsulosin  0.[name] mg Oral QPC supper  . vitamin B-12  1,000 mcg Oral Daily   Continuous Infusions: PRN Meds:.albuterol, nitroGLYCERIN, senna-docusate, traMADol  Antimicrobials: Anti-infectives (From admission, onward)   None      I have personally reviewed the following labs and images: CBC: Recent Labs  Lab 11/17/19 0432 11/18/19 0548 11/19/19 0507 11/20/19 0820 11/23/19 0633  WBC 7.8 6.6 6.8 7.2 5.9  NEUTROABS  --   --   --  5.9  --   HGB 7.9* 8.2* 8.6* 8.5* 8.7*  HCT 28.2* 29.8* 30.7* 31.2* 32.3*  MCV 74.0* 75.[name]*  75.6* 76.8* 79.6*  PLT 163 179 184 198 222   BMP &GFR Recent Labs  Lab 11/19/19 0507 11/20/19 0820 11/21/19 0735 11/22/19 0625 11/23/19 0633  NA 132* 133* 138 137 136  K 3.8 3.3* 3.8 3.6 [name].1  CL 95* 96* 100 102 99  CO2 24 27 28 27 30   GLUCOSE 131* 212* 153* 118* 111*  BUN 18 17 17 16 14   CREATININE 0.87 0.99 1.09 0.86 0.83  CALCIUM 8.[name]* 8.2* 8.7* 8.[name]* 8.[name]*  MG  --  1.8 1.9 2.1 2.1  PHOS  --   --  3.3 3.5 3.7   Estimated Creatinine Clearance: 81.1 mL/min (by C-G formula based on SCr of 0.83 mg/dL). Liver & Pancreas: Recent Labs  Lab 11/20/19 0820 11/21/19 0735 11/22/19 0625 11/23/19 0633  AST 12* 14*  --   --   ALT 15 16  --   --   ALKPHOS 64 66  --   --   BILITOT 1.7* 2.1* 1.5*  --   PROT 5.6* 6.2*  --   --   ALBUMIN 2.7* 3.0* 2.5* 2.6*   No results for input(s): LIPASE, AMYLASE in the last 168 hours. No results for input(s): AMMONIA in the last 168 hours. Diabetic: No results for input(s): HGBA1C in the last 72 hours. Recent Labs  Lab 11/22/19 1152  11/22/19 1618 11/22/19 2124 11/23/19 0604 11/23/19 1145  GLUCAP 164* 94 150* 113* 195*   Cardiac Enzymes: No results for input(s): CKTOTAL, CKMB, CKMBINDEX, TROPONINI in the last 168 hours. No results for input(s): PROBNP in the last 8760 hours. Coagulation Profile: No results for input(s): INR, PROTIME in the last 168 hours. Thyroid Function Tests: No results for input(s): TSH, T4TOTAL, FREET4, T3FREE, THYROIDAB in the last 72 hours. Lipid Profile: No results for input(s): CHOL, HDL, LDLCALC, TRIG, CHOLHDL, LDLDIRECT in the last 72 hours. Anemia Panel: No results for input(s): VITAMINB12, FOLATE, FERRITIN, TIBC, IRON, RETICCTPCT in the last 72 hours. Urine analysis:    Component Value Date/Time   COLORURINE YELLOW 06/07/2019 1231   APPEARANCEUR CLOUDY (A) 06/07/2019 1231   LABSPEC 1.013 06/07/2019 1231   PHURINE 5.0 06/07/2019 1231   GLUCOSEU 150 (A) 06/07/2019 1231   HGBUR SMALL (A) 06/07/2019 1231   HGBUR negative 04/05/2010 0837   BILIRUBINUR NEGATIVE 06/07/2019 1231   BILIRUBINUR n 03/27/2012 1323   KETONESUR NEGATIVE 06/07/2019 1231   PROTEINUR NEGATIVE 06/07/2019 1231   UROBILINOGEN 1.0 11/28/2013 1000   NITRITE NEGATIVE 06/07/2019 1231   LEUKOCYTESUR LARGE (A) 06/07/2019 1231   Sepsis Labs: Invalid input(s): PROCALCITONIN, [place]  Microbiology: Recent Results (from the past 240 hour(s))  Respiratory Panel by RT PCR (Flu A&B, Covid) - Nasopharyngeal Swab     Status: None   Collection Time: 11/13/19  6:27 PM   Specimen: Nasopharyngeal Swab  Result Value Ref Range Status   SARS Coronavirus 2 by RT PCR NEGATIVE NEGATIVE Final    Comment: (NOTE) SARS-CoV-2 target nucleic acids are NOT DETECTED. The SARS-CoV-2 RNA is generally detectable in upper respiratoy specimens during the acute phase of infection. The lowest concentration of SARS-CoV-2 viral copies this assay can detect is 131 copies/mL. A negative result does not preclude SARS-Cov-2 infection and  should not be used as the sole basis for treatment or other patient management decisions. A negative result may occur with  improper specimen collection/handling, submission of specimen other than nasopharyngeal swab, presence of viral mutation(s) within the areas targeted by this assay, and inadequate number of viral copies (<131 copies/mL).  A negative result must be combined with clinical observations, patient history, and epidemiological information. The expected result is Negative. Fact Sheet for Patients:  PinkCheek.be Fact Sheet for Healthcare Providers:  GravelBags.it This test is not yet ap proved or cleared by the [place] FDA and  has been authorized for detection and/or diagnosis of SARS-CoV-2 by FDA under an Emergency Use Authorization (EUA). This EUA will remain  in effect (meaning this test can be used) for the duration of the COVID-19 declaration under Section 564(b)(1) of the Act, 21 U.S.C. section 360bbb-3(b)(1), unless the authorization is terminated or revoked sooner.    Influenza A by PCR NEGATIVE NEGATIVE Final   Influenza B by PCR NEGATIVE NEGATIVE Final    Comment: (NOTE) The Xpert Xpress SARS-CoV-2/FLU/RSV assay is intended as an aid in  the diagnosis of influenza from Nasopharyngeal swab specimens and  should not be used as a sole basis for treatment. Nasal washings and  aspirates are unacceptable for Xpert Xpress SARS-CoV-2/FLU/RSV  testing. Fact Sheet for Patients: PinkCheek.be Fact Sheet for Healthcare Providers: GravelBags.it This test is not yet approved or cleared by the [place] FDA and  has been authorized for detection and/or diagnosis of SARS-CoV-2 by  FDA under an Emergency Use Authorization (EUA). This EUA will remain  in effect (meaning this test can be used) for the duration of the  Covid-19 declaration under Section  564(b)(1) of the Act, 21  U.S.C. section 360bbb-3(b)(1), unless the authorization is  terminated or revoked. Performed at [facility], [place] [address], [place], [place] [address]     Radiology Studies: No results found.     Fawzi Melman T. [place]  If 7PM-7AM, please contact night-coverage [email or web address] Password [email or web address] 11/23/2019, 12:12 PM

## 2019-11-23 NOTE — Progress Notes (Signed)
Progress Note   Subjective   Doing well today, the patient denies CP.  SOB is stable.  He still desats with ambulation on 6L.  No new concerns  Inpatient Medications    Scheduled Meds: . acetaminophen  1,000 mg Oral Q8H  . alum & mag hydroxide-simeth  30 mL Oral BID  . aspirin EC  81 mg Oral Daily  . atorvastatin  10 mg Oral Daily  . bethanechol  10 mg Oral TID  . furosemide  40 mg Intravenous BID  . insulin aspart  0-5 Units Subcutaneous QHS  . insulin aspart  0-9 Units Subcutaneous TID WC  . insulin glargine  5 Units Subcutaneous Daily  . lidocaine  1 application Urethral Once  . mouth rinse  15 mL Mouth Rinse BID  . mometasone-formoterol  2 puff Inhalation BID  . tamsulosin  0.4 mg Oral QPC supper  . vitamin B-12  1,000 mcg Oral Daily   Continuous Infusions:  PRN Meds: albuterol, nitroGLYCERIN, senna-docusate, traMADol   Vital Signs    Vitals:   11/22/19 1900 11/22/19 2013 11/23/19 0534 11/23/19 0830  BP:  (!) 91/52 (!) 104/59 (!) 106/55  Pulse:  71 75   Resp:  18 18 20   Temp:  98.3 F (36.8 C) 97.7 F (36.5 C) 98.2 F (36.8 C)  TempSrc:  Oral Oral Oral  SpO2: 92% 93% 95% 93%  Weight:   71.3 kg   Height:        Intake/Output Summary (Last 24 hours) at 11/23/2019 1109 Last data filed at 11/23/2019 1000 Gross per 24 hour  Intake 2120 ml  Output 2750 ml  Net -630 ml   Filed Weights   11/21/19 0416 11/22/19 0500 11/23/19 0534  Weight: 71.8 kg 71.4 kg 71.3 kg    Telemetry    V paced, rare PVCs, no prolonged VT - Personally Reviewed  Physical Exam   GEN- The patient is chronically ill appearing, alert and oriented x 3 today.   Head- normocephalic, atraumatic Eyes-  Sclera clear, conjunctiva pink Ears- hearing intact Oropharynx- clear Neck- supple, Lungs-  Decreased air movement  On 6L Heart- Regular rate and rhythm  (paced) GI- soft  Extremities- no clubbing, cyanosis, + dependant edema  MS- diffuse atrophy Skin- no rash or lesion Psych-  euthymic mood, full affect Neuro- strength and sensation are intact   Labs    Chemistry Recent Labs  Lab 11/20/19 0820 11/20/19 0820 11/21/19 0735 11/22/19 0625 11/23/19 0633  NA 133*   < > 138 137 136  K 3.3*   < > 3.8 3.6 4.1  CL 96*   < > 100 102 99  CO2 27   < > 28 27 30   GLUCOSE 212*   < > 153* 118* 111*  BUN 17   < > 17 16 14   CREATININE 0.99   < > 1.09 0.86 0.83  CALCIUM 8.2*   < > 8.7* 8.4* 8.4*  PROT 5.6*  --  6.2*  --   --   ALBUMIN 2.7*   < > 3.0* 2.5* 2.6*  AST 12*  --  14*  --   --   ALT 15  --  16  --   --   ALKPHOS 64  --  66  --   --   BILITOT 1.7*  --  2.1* 1.5*  --   GFRNONAA >60   < > >60 >60 >60  GFRAA >60   < > >60 >60 >60  ANIONGAP  10   < > 10 8 7    < > = values in this interval not displayed.     Hematology Recent Labs  Lab 11/19/19 0507 11/20/19 0820 11/23/19 0633  WBC 6.8 7.2 5.9  RBC 4.06* 4.06* 4.06*  HGB 8.6* 8.5* 8.7*  HCT 30.7* 31.2* 32.3*  MCV 75.6* 76.8* 79.6*  MCH 21.2* 20.9* 21.4*  MCHC 28.0* 27.2* 26.9*  RDW 19.7* 21.0* 24.0*  PLT 184 198 222     Assessment & Plan    1.  Acute on chronic hypoxic respiratory failure Chronically on 4 L O2 and followed by pulmonary.  His wife feels that he is near baseline, though he currently desats on 6L I suspect his advanced COPD is driving this primarily.  He has pulmonary hypertension. Will need close outpatient follow-up with pulmonary team. Would consider palliative care  2. Acute on chronic systolic dysfunction Clinically improved with diuresis Could probably convert to oral lasix soon  3. Permanent afib Not on anticoagulation due to [place] S/p AV nodal ablation  4. Debility/ deconditioning His wife is worried about his falls at home and her ability to care for him. Pt not willing to consider SNF.  I think home hospice could potentially offer them the support that they need.  Prognosis is poor Consider palliative care consultation as above for goals of care  conversation  Thompson Grayer MD, [facility] 11/23/2019 11:09 AM

## 2019-11-23 NOTE — Progress Notes (Signed)
Occupational Therapy Treatment Patient Details Name: [name] MRN: [id number] DOB: 06-27-1946 Today's Date: 11/23/2019    History of present illness SAKETH DAUBERT is a 74 y.o. male with PMH of chronic hypoxemic respiratory failure on 4 L of home oxygen, systolic heart failure, CVA, ICH, HTN, HLD, T2 DM, lung cancer stage Ia, thoracic aortic aneurysm, permanent atrial fibrillation s/p ICD, who presents 11/13/19 with worsening SOB and edema. Workup for CHF exacerbation.   OT comments  Pt admitted with above. Pt currently with functional limitations due to the deficits listed below (see OT Problem List). Pt at this time was able to complete supine to sitting and sitting to supine and standing with supervision to min guard. At this time monitoring o2 with activity and noted bellow. Spoke to nursing staff about changes in o2 reading at this time and concern about completion of functional activity and on 4L via [place] as with static standing decline to 85% o2. Pt was educated about how they are asymptomatic with changes in o2 readings at this time and about monitoring levels with activity.  Pt will benefit from skilled OT to increase their safety and independence with ADL and functional mobility for ADL to facilitate discharge to venue listed below.   Pt at 6L via [place]: supine 96% sitting 95% standing for about 5 mins 92-97% Pt at 5L via [place]: sitting 91-95%  standing for about 4 mins 90-94% Pt at 4L via [place]: sitting for about 5 mins 88-92% standing 87-91% with less then 4 mins of static standing and started to decline further but asymptomatic. Pt then placed on 5L o2 via [place] and became stable in sitting at 94% o2 and HR 74-73.    Follow Up Recommendations  Home health OT;Supervision/Assistance - 24 hour    Equipment Recommendations  3 in 1 bedside commode    Recommendations for Other Services      Precautions / Restrictions Precautions Precautions: Fall;Other (comment) Precaution Comments: Watch SpO2  and BP Restrictions Weight Bearing Restrictions: No       Mobility Bed Mobility Overal bed mobility: Modified Independent Bed Mobility: Supine to Sit     Supine to sit: Modified independent (Device/Increase time)        Transfers Overall transfer level: Needs assistance Equipment used: Rolling walker (2 wheeled) Transfers: Sit to/from Stand Sit to Stand: Supervision         General transfer comment: cue for pacing self    Balance Overall balance assessment: Needs assistance Sitting-balance support: No upper extremity supported;Feet supported;Feet unsupported Sitting balance-Leahy Scale: Good     Standing balance support: No upper extremity supported;During functional activity Standing balance-Leahy Scale: Fair Standing balance comment: No dizziness reported; pt static standing momentarily before sitting EOB.                           ADL either performed or assessed with clinical judgement   ADL Overall ADL's : Needs assistance/impaired Eating/Feeding: Independent;Sitting   Grooming: Wash/dry hands;Wash/dry face;Sitting   Upper Body Bathing: Set up;Sitting   Lower Body Bathing: Min guard;Cueing for safety;Cueing for sequencing;Sit to/from stand   Upper Body Dressing : Supervision/safety;Cueing for safety;Cueing for sequencing;Sitting   Lower Body Dressing: Supervision/safety;Sit to/from stand;Cueing for safety;Cueing for sequencing   Toilet Transfer: Minimal assistance;Ambulation Toilet Transfer Details (indicate cue type and reason): Assist to avoid plopping Toileting- Clothing Manipulation and Hygiene: Minimal assistance;Sitting/lateral lean   Tub/ Shower Transfer: Minimal assistance;Ambulation;Shower seat;Rolling walker  Functional mobility during ADLs: Min guard;Cueing for safety General ADL Comments: Pt limited activity as decrease wanting to participate in therapy session     Vision   Vision Assessment?: No apparent visual deficits    Perception     Praxis      Cognition Arousal/Alertness: Awake/alert Behavior During Therapy: WFL for tasks assessed/performed Overall Cognitive Status: Within Functional Limits for tasks assessed                                 General Comments: Pt was very irritated in session but as progressed did say thank you by the end of the session        Exercises     Shoulder Instructions       General Comments      Pertinent Vitals/ Pain       Pain Assessment: Faces Faces Pain Scale: No hurt  Home Living                                          Prior Functioning/Environment              Frequency  Min 2X/week        Progress Toward Goals  OT Goals(current goals can now be found in the care plan section)  Progress towards OT goals: Progressing toward goals  Acute Rehab OT Goals Patient Stated Goal: return home OT Goal Formulation: With patient Time For Goal Achievement: 11/29/19 Potential to Achieve Goals: Good ADL Goals Pt Will Perform Lower Body Bathing: with modified independence;sitting/lateral leans;sit to/from stand Pt Will Perform Lower Body Dressing: with modified independence;sitting/lateral leans;sit to/from stand Pt Will Transfer to Toilet: with modified independence;ambulating;regular height toilet;grab bars Additional ADL Goal #1: Pt will self monitor and maintain SpO2 sats above 88% with BADL activity Additional ADL Goal #2: Pt will recall and apply 3-5 ECS strategies to BADL activity  Plan Discharge plan remains appropriate    Co-evaluation                 AM-PAC OT "6 Clicks" Daily Activity     Outcome Measure   Help from another person eating meals?: None Help from another person taking care of personal grooming?: A Little Help from another person toileting, which includes using toliet, bedpan, or urinal?: A Little Help from another person bathing (including washing, rinsing, drying)?: A  Little Help from another person to put on and taking off regular upper body clothing?: None Help from another person to put on and taking off regular lower body clothing?: A Little 6 Click Score: 20    End of Session Equipment Utilized During Treatment: Gait belt;Rolling walker  OT Visit Diagnosis: Unsteadiness on feet (R26.81);Other abnormalities of gait and mobility (R26.89);Muscle weakness (generalized) (M62.81)   Activity Tolerance Patient tolerated treatment well;Other (comment)(pt limited due to decrease willingness to participate and li)   Patient Left in bed;with call bell/phone within reach   Nurse Communication Mobility status;Other (comment)(o2 readings)        Time: 5573-2202 OT Time Calculation (min): 54 min  Charges: OT General Charges $OT Visit: 1 Visit OT Treatments $Self Care/Home Management : 53-67 mins  Joeseph Amor OTR/L  Acute Rehab Services  [phone] office number [phone] pager number    Joeseph Amor 11/23/2019, 1:51 PM

## 2019-11-24 DIAGNOSIS — Z7189 Other specified counseling: Secondary | ICD-10-CM

## 2019-11-24 DIAGNOSIS — Z515 Encounter for palliative care: Secondary | ICD-10-CM

## 2019-11-24 DIAGNOSIS — J9601 Acute respiratory failure with hypoxia: Secondary | ICD-10-CM

## 2019-11-24 DIAGNOSIS — I5043 Acute on chronic combined systolic (congestive) and diastolic (congestive) heart failure: Secondary | ICD-10-CM

## 2019-11-24 LAB — RENAL FUNCTION PANEL
Albumin: 2.8 g/dL — ABNORMAL LOW (ref 3.5–5.0)
Anion gap: 9 (ref 5–15)
BUN: 16 mg/dL (ref 8–23)
CO2: 28 mmol/L (ref 22–32)
Calcium: 8.5 mg/dL — ABNORMAL LOW (ref 8.9–10.3)
Chloride: 99 mmol/L (ref 98–111)
Creatinine, Ser: 0.83 mg/dL (ref 0.61–1.24)
GFR calc Af Amer: >60 mL/min (ref >60)
GFR calc non Af Amer: >60 mL/min (ref >60)
Glucose, Bld: 126 mg/dL — ABNORMAL HIGH (ref 70–99)
Phosphorus: 4 mg/dL (ref 2.5–4.6)
Potassium: 3.7 mmol/L (ref 3.5–5.1)
Sodium: 136 mmol/L (ref 135–145)

## 2019-11-24 LAB — GLUCOSE, CAPILLARY
Glucose-Capillary: 128 mg/dL — ABNORMAL HIGH (ref 70–99)
Glucose-Capillary: 127 mg/dL — ABNORMAL HIGH (ref 70–99)
Glucose-Capillary: 181 mg/dL — ABNORMAL HIGH (ref 70–99)
Glucose-Capillary: 184 mg/dL — ABNORMAL HIGH (ref 70–99)

## 2019-11-24 LAB — MAGNESIUM: Magnesium: 2 mg/dL (ref 1.7–2.4)

## 2019-11-24 MED ORDER — FUROSEMIDE 40 MG PO TABS: 60.0000 mg | ORAL_TABLET | Freq: Two times a day (BID) | ORAL | Status: DC

## 2019-11-24 MED ORDER — FUROSEMIDE 40 MG PO TABS: 40.0000 mg | ORAL_TABLET | Freq: Two times a day (BID) | ORAL | Status: DC

## 2019-11-24 MED ORDER — TORSEMIDE 20 MG PO TABS: 40.0000 mg | ORAL_TABLET | Freq: Two times a day (BID) | ORAL | Status: DC

## 2019-11-24 MED ORDER — FUROSEMIDE 20 MG PO TABS
60.0000 mg | ORAL_TABLET | Freq: Two times a day (BID) | ORAL | Status: DC
  Administered 2019-11-24 – 2019-11-25 (×2): 60 mg via ORAL
  Filled 2019-11-24 (×2): qty 3

## 2019-11-24 MED ORDER — FUROSEMIDE 40 MG PO TABS
60.0000 mg | ORAL_TABLET | Freq: Two times a day (BID) | ORAL | Status: DC
  Administered 2019-11-24: 60 mg via ORAL
  Filled 2019-11-24 (×2): qty 1

## 2019-11-24 NOTE — Progress Notes (Signed)
Progress Note   Subjective   Doing well today, the patient denies CP.  He feels that his SOB is better.  No new concerns  Inpatient Medications    Scheduled Meds: . acetaminophen  1,000 mg Oral Q8H  . alum & mag hydroxide-simeth  30 mL Oral BID  . aspirin EC  81 mg Oral Daily  . atorvastatin  10 mg Oral Daily  . bethanechol  10 mg Oral TID  . furosemide  60 mg Oral BID  . insulin aspart  0-5 Units Subcutaneous QHS  . insulin aspart  0-9 Units Subcutaneous TID WC  . insulin glargine  5 Units Subcutaneous Daily  . lidocaine  1 application Urethral Once  . mouth rinse  15 mL Mouth Rinse BID  . mometasone-formoterol  2 puff Inhalation BID  . tamsulosin  0.4 mg Oral QPC supper  . vitamin B-12  1,000 mcg Oral Daily   Continuous Infusions:  PRN Meds: albuterol, nitroGLYCERIN, senna-docusate, traMADol   Vital Signs    Vitals:   11/23/19 1943 11/23/19 2017 11/24/19 0458 11/24/19 0823  BP:  97/63 105/66   Pulse: 72 73 73   Resp: 18 18 16    Temp:  (!) 97.5 F (36.4 C) 98.4 F (36.9 C)   TempSrc:  Oral Oral   SpO2: 92% 99% 94% 96%  Weight:   71.4 kg   Height:        Intake/Output Summary (Last 24 hours) at 11/24/2019 0936 Last data filed at 11/24/2019 0388 Gross per 24 hour  Intake 960 ml  Output 1950 ml  Net -990 ml   Filed Weights   11/22/19 0500 11/23/19 0534 11/24/19 0458  Weight: 71.4 kg 71.3 kg 71.4 kg    Telemetry    Afib, V paced - Personally Reviewed  Physical Exam   GEN- The patient is elderly and frail appearing, alert and oriented x 3 today.   Head- normocephalic, atraumatic Eyes-  Sclera clear, conjunctiva pink Ears- hearing intact Oropharynx- clear Neck- supple, Lungs-  normal work of breathing Heart- Regular rate and rhythm  (paced) GI- soft  Extremities- no clubbing, cyanosis, trace edema  MS-diffuse atrophy Skin- no rash or lesion Psych- euthymic mood, full affect Neuro- strength and sensation are intact   Labs     Chemistry Recent Labs  Lab 11/20/19 0820 11/20/19 0820 11/21/19 0735 11/21/19 0735 11/22/19 0625 11/23/19 0633 11/24/19 0324  NA 133*   < > 138   < > 137 136 136  K 3.3*   < > 3.8   < > 3.6 4.1 3.7  CL 96*   < > 100   < > 102 99 99  CO2 27   < > 28   < > 27 30 28   GLUCOSE 212*   < > 153*   < > 118* 111* 126*  BUN 17   < > 17   < > 16 14 16   CREATININE 0.99   < > 1.09   < > 0.86 0.83 0.83  CALCIUM 8.2*   < > 8.7*   < > 8.4* 8.4* 8.5*  PROT 5.6*  --  6.2*  --   --   --   --   ALBUMIN 2.7*   < > 3.0*   < > 2.5* 2.6* 2.8*  AST 12*  --  14*  --   --   --   --   ALT 15  --  16  --   --   --   --  ALKPHOS 64  --  66  --   --   --   --   BILITOT 1.7*  --  2.1*  --  1.5*  --   --   GFRNONAA >60   < > >60   < > >60 >60 >60  GFRAA >60   < > >60   < > >60 >60 >60  ANIONGAP 10   < > 10   < > 8 7 9    < > = values in this interval not displayed.     Hematology Recent Labs  Lab 11/19/19 0507 11/20/19 0820 11/23/19 0633  WBC 6.8 7.2 5.9  RBC 4.06* 4.06* 4.06*  HGB 8.6* 8.5* 8.7*  HCT 30.7* 31.2* 32.3*  MCV 75.6* 76.8* 79.6*  MCH 21.2* 20.9* 21.4*  MCHC 28.0* 27.2* 26.9*  RDW 19.7* 21.0* 24.0*  PLT 184 198 222     Patient ID  74 yo male admitted with acute on chronic hypoxic respiratory failure with severe COPD on home O2.  He also has acute on chronic systolic dysfunction, permanent afib s/p AV nodal ablation, and h/o MVR for mitral valve endocarditis.  Assessment & Plan    1.  Acute on chronic hypoxic respiratory failure Chronically on 4L. Continues to have high O2 requirement but feels improved from yesterday.  He has advanced COPD as well as pulmonary hypertension.  Palliative consultation has been placed, which I think is very appropriate to discuss goals of care with patient and spouse.  2. Acute on chronic systolic and diastolic dysfunction with biventricular failure Continue diuresis could switch to oral lasix soon  3. Permanent afib Not on anticoagulation due to  prior ICH S/p AV nodal ablation  4. Debility/ deconditioning Wife worries about his frequent falls and her ability to care for him. He is not willing to consider SNF.  Could consider home hospice as an option  Prognosis is poor He is DNI/DNR Palliative care has been consulted  Thompson Grayer MD, [facility] 11/24/2019 9:36 AM

## 2019-11-24 NOTE — Progress Notes (Signed)
PROGRESS NOTE  [name]:[id number] DOB: 11/08/1945   PCP: [name], NP  Patient is from: Home  DOA: 11/13/2019 LOS: 69  Brief Narrative / Interim history: 74 year old male with history of CVA, ICH, HTN, HLD, type II DM, stage I lung cancer, aortic aneurysm, A. fib, chronic systolic CHF/ICD implant, LAA closure, chronic hypoxic respiratory failure on 4 LPM.  Presented with complaints of shortness of breath found to have acute on chronic systolic CHF.  Started on IV Lasix. Cardiology consulted later in the course.  Increased IV Lasix to 60 mg twice daily.  Now on IV Lasix 40 mg twice daily.  Cardiology following. Wife worries about his frequent falls and ability to care for him.  Patient is not willing to consider SNF. Palliative care consult given poor prognosis.    Subjective: Seen and examined earlier this morning.  No major events overnight or this morning.  Denies chest pain, dyspnea, GI or UTI symptoms.  O2 requirement slightly improved from yesterday.  Continues to have good urine output on Lasix.  Objective: Vitals:   11/23/19 2017 11/24/19 0458 11/24/19 0823 11/24/19 1136  BP: 97/63 105/66  (!) 142/64  Pulse: 73 73  74  Resp: 18 16  18   Temp: (!) 97.5 F (36.4 C) 98.4 F (36.9 C)  97.7 F (36.5 C)  TempSrc: Oral Oral  Oral  SpO2: 99% 94% 96% 99%  Weight:  71.4 kg    Height:        Intake/Output Summary (Last 24 hours) at 11/24/2019 1150 Last data filed at 11/24/2019 0904 Gross per 24 hour  Intake 960 ml  Output 1500 ml  Net -540 ml   Filed Weights   11/22/19 0500 11/23/19 0534 11/24/19 0458  Weight: 71.4 kg 71.3 kg 71.4 kg    Examination:  GENERAL: No apparent distress.  Nontoxic. HEENT: MMM.  Vision grossly intact.  Diminished hearing. NECK: Supple.  No apparent JVD.  RESP: 94% on 4 L.  No IWOB.  Fair aeration with bibasilar crackles. CVS:  RRR. Heart sounds normal.  ABD/GI/GU: BS+. Abd soft, NTND.  MSK/EXT:  Moves extremities.  2+ pitting  edema through TED hose. SKIN: no apparent skin lesion or wound NEURO: Awake, alert and oriented appropriately.  No apparent focal neuro deficit. PSYCH: Calm. Normal affect.  Procedures:  None  Microbiology summarized: 5/5-COVID-19 PCR negative. 5/5-influenza PCR negative.  Assessment & Plan: Acute on chronic hypoxic respiratory failure-multifactorial including CHF exacerbation, pHTN and underlying COPD.  On 4 L at baseline.  Requiring 5 L.  Difficult to wean due to significant desaturations. -Wean oxygen as able.  Minimum oxygen to keep saturation above 88%.  -Incentive spirometry/OOB/PT/OT -Palliative care consulted  Acute on chronic systolic CHF/NICM s/p BiV-ICD/pHTN: Echo with EF of 25 to 30%, diffuse hypokinesis, severe LAE and RAE, moderately reduced RVSF and severely enlarged RV.  Good UOP with IV Lasix, 1.7 L / 24 hours.  Net -6.3 L.  BP and creatinine is stable. Still with significant edema. -Appreciate cardiology's help -Transitioned to p.o. Lasix 60 mg twice daily -GDMT-Per cardiology. -TED hose-thigh-high. -Monitor fluid status, renal function and electrolytes.  Advanced COPD: Chronic.  Seems to be playing a huge role in his O2 requirement. -Continue home meds  -Oxygen as above.  Permanent A. Fib. S/p AV ablation History of bioprosthetic MVR as well as LAA closure. -Not on any anticoagulation due to history of ICH. -Not taking any rate control medication for now. -Continue monitoring on telemetry  Thoracic aortic  aneurysm-followed by Dr. Roxy Manns.  Felt to be stable with no plan for surgical intervention given comorbidities -Outpatient follow-up  Hyperbilirubinemia: T bili 2.1> 1.5.  Indirect 1.2.  H&H stable. -Continue monitoring  Iron deficiency anemia: Baseline Hgb 10-11>8.7 (4/29)> 9.3 (admit)>> 8.5.  Iron sat 3 with low ferritin and elevated TIBC. -IV Feraheme on 5/8 and 5/12. -Monitor H&H  Uncontrolled DM-2 with hyperglycemia and hyperlipidemia: A1c  7.4%. Recent Labs    11/23/19 2137 11/24/19 0625 11/24/19 1134  GLUCAP 141* 128* 127*  -Continue SSI and statin.  History of non-small cell lung cancer. -s/p wedge resection. -Outpatient follow-up  Concern about acute urinary retention on bladder scan: refused in and out cath but seems to be voiding okay.  No suprapubic tenderness. -Discontinued Robaxin and oxycodone, and scheduled Tylenol for pain -Continue Flomax and bethanechol  Atypical chest pain: Resolved.  Troponin x3 not consistent with ACS. EKG nonischemic.  -Continue scheduled Tylenol and as needed tramadol  Low back pain/left shoulder pain-lumbar spine, shoulder, clavicle and chest film negative for acute finding.  Pain seems to have resolved. -X-ray lumbar spine shows evidence of arthritis.  No acute fracture.  -Continue Tylenol 1 g 3 times daily  Near syncope-some concern about vasovagal -Continue monitoring -Continue TED hose-change to thigh-high.  Debility/physical deconditioning: Wife concerned about providing care at home -Therapy recommended home health PT/OT with DME. -Palliative care consulted   Goal of care/DNR/DNI-patient with significant cardiopulmonary comorbidities.  Poor prognosis.  Could benefit from hospice. -Consult palliative care           DVT prophylaxis: SCD due to"severe allergies to anticoagulants" Code Status: DNR/DNI Family Communication: Attempted to call patient's wife for update but no answer. Status is: Inpatient  Remains inpatient appropriate because:Inpatient level of care appropriate due to severity of illness and Increased O2 requirement above baseline   Dispo: The patient is from: Home              Anticipated d/c is to: Home              Anticipated d/c date is: 1 day              Patient currently is not medically stable to d/c.        Consultants:  Cardiology PMT   Sch Meds:  Scheduled Meds: . acetaminophen  1,000 mg Oral Q8H  . alum & mag  hydroxide-simeth  30 mL Oral BID  . aspirin EC  81 mg Oral Daily  . atorvastatin  10 mg Oral Daily  . bethanechol  10 mg Oral TID  . furosemide  60 mg Oral BID  . insulin aspart  0-5 Units Subcutaneous QHS  . insulin aspart  0-9 Units Subcutaneous TID WC  . insulin glargine  5 Units Subcutaneous Daily  . lidocaine  1 application Urethral Once  . mouth rinse  15 mL Mouth Rinse BID  . mometasone-formoterol  2 puff Inhalation BID  . tamsulosin  0.4 mg Oral QPC supper  . vitamin B-12  1,000 mcg Oral Daily   Continuous Infusions: PRN Meds:.albuterol, nitroGLYCERIN, senna-docusate, traMADol  Antimicrobials: Anti-infectives (From admission, onward)   None      I have personally reviewed the following labs and images: CBC: Recent Labs  Lab 11/18/19 0548 11/19/19 0507 11/20/19 0820 11/23/19 0633  WBC 6.6 6.8 7.2 5.9  NEUTROABS  --   --  5.9  --   HGB 8.2* 8.6* 8.5* 8.7*  HCT 29.8* 30.7* 31.2* 32.3*  MCV 75.4*  75.6* 76.8* 79.6*  PLT 179 184 198 222   BMP &GFR Recent Labs  Lab 11/20/19 0820 11/21/19 0735 11/22/19 0625 11/23/19 0633 11/24/19 0324  NA 133* 138 137 136 136  K 3.3* 3.8 3.6 4.1 3.7  CL 96* 100 102 99 99  CO2 27 28 27 30 28   GLUCOSE 212* 153* 118* 111* 126*  BUN 17 17 16 14 16   CREATININE 0.99 1.09 0.86 0.83 0.83  CALCIUM 8.2* 8.7* 8.4* 8.4* 8.5*  MG 1.8 1.9 2.1 2.1 2.0  PHOS  --  3.3 3.5 3.7 4.0   Estimated Creatinine Clearance: 81.2 mL/min (by C-G formula based on SCr of 0.83 mg/dL). Liver & Pancreas: Recent Labs  Lab 11/20/19 0820 11/21/19 0735 11/22/19 0625 11/23/19 0633 11/24/19 0324  AST 12* 14*  --   --   --   ALT 15 16  --   --   --   ALKPHOS 64 66  --   --   --   BILITOT 1.7* 2.1* 1.5*  --   --   PROT 5.6* 6.2*  --   --   --   ALBUMIN 2.7* 3.0* 2.5* 2.6* 2.8*   No results for input(s): LIPASE, AMYLASE in the last 168 hours. No results for input(s): AMMONIA in the last 168 hours. Diabetic: No results for input(s): HGBA1C in the last  72 hours. Recent Labs  Lab 11/23/19 1145 11/23/19 1647 11/23/19 2137 11/24/19 0625 11/24/19 1134  GLUCAP 195* 98 141* 128* 127*   Cardiac Enzymes: No results for input(s): CKTOTAL, CKMB, CKMBINDEX, TROPONINI in the last 168 hours. No results for input(s): PROBNP in the last 8760 hours. Coagulation Profile: No results for input(s): INR, PROTIME in the last 168 hours. Thyroid Function Tests: No results for input(s): TSH, T4TOTAL, FREET4, T3FREE, THYROIDAB in the last 72 hours. Lipid Profile: No results for input(s): CHOL, HDL, LDLCALC, TRIG, CHOLHDL, LDLDIRECT in the last 72 hours. Anemia Panel: No results for input(s): VITAMINB12, FOLATE, FERRITIN, TIBC, IRON, RETICCTPCT in the last 72 hours. Urine analysis:    Component Value Date/Time   COLORURINE YELLOW 06/07/2019 1231   APPEARANCEUR CLOUDY (A) 06/07/2019 1231   LABSPEC 1.013 06/07/2019 1231   PHURINE 5.0 06/07/2019 1231   GLUCOSEU 150 (A) 06/07/2019 1231   HGBUR SMALL (A) 06/07/2019 1231   HGBUR negative 04/05/2010 0837   BILIRUBINUR NEGATIVE 06/07/2019 1231   BILIRUBINUR n 03/27/2012 1323   KETONESUR NEGATIVE 06/07/2019 1231   PROTEINUR NEGATIVE 06/07/2019 1231   UROBILINOGEN 1.0 11/28/2013 1000   NITRITE NEGATIVE 06/07/2019 1231   LEUKOCYTESUR LARGE (A) 06/07/2019 1231   Sepsis Labs: Invalid input(s): PROCALCITONIN, [place]  Microbiology: No results found for this or any previous visit (from the past 240 hour(s)).  Radiology Studies: No results found.     Mikaele Stecher T. [place]  If 7PM-7AM, please contact night-coverage [email or web address] Password [facility] 11/24/2019, 11:50 AM

## 2019-11-24 NOTE — Consult Note (Signed)
Consultation Note Date: 11/24/2019   Patient Name: [name]  DOB: Oct 23, 1945  MRN: [id number]  Age / Sex: 74 y.o., male  PCP: Charles Peng, NP Referring Physician: Mercy Riding, MD  Reason for Consultation: Establishing goals of care  HPI/Patient Profile: 74 y.o. male  with past medical history of CHF with ICD, COPD on home oxygen, stage I lung cancer, CVA, a fib, HLD, and HTN admitted on 11/13/2019 with shortness of breath. Diagnosed with acute on chronic CHF and started on IV lasix.  Now requiring 5 L oxygen. Per chart review, patient refusing SNF and wife concerned about caring for him at home. Per cardiology, patient with poor prognosis and could benefit from hospice. PMT consulted for [name].  Clinical Assessment and Goals of Care: I have reviewed medical records including EPIC notes, labs and imaging, received report from RN, assessed the patient and then met with patient  to discuss diagnosis prognosis, GOC, EOL wishes, disposition and options.  Palliative met with this patient in Oct 2020 and he was not interested in discussing goals of care. During my visit today he expresses disinterest as well. We briefly discussed care and then he began speaking on the phone and I excused myself from the room with intent to speak to family. Prior to his phone call he did confirm that his wife serves as his decision maker if he is unable. He gave me permission to call her. He told me his main goal was to get home. Tells me he is doing well, feels well, and has no complaints. Denies dyspnea. He tells me he does not need any support at home.   I called patient's spouse, [name].  I introduced Palliative Medicine as specialized medical care for people living with serious illness. It focuses on providing relief from the symptoms and stress of a serious illness. The goal is to improve quality of life for both the patient and the family.  As far as functional  and nutritional status, she tells me patient is typically very mobile at home. He  Has a great appetite. She does report frequent falls after hospitalization in October and this is the reason for her concern about him coming home this time.    We discussed patient's current illness and what it means in the larger context of patient's on-going co-morbidities. We discussed heart and lung disease and concern about how [name] will do moving forward.   I attempted to elicit values and goals of care important to the patient.  We both acknowledge that what is most important to [name] is being at home.   We discuss concern that [name] may need some extra support at home - [name] agrees but tells me he refuses all help at home - refuses home health. He also refuses rehab placement. We discuss that [name] is likely eligible for hospice support at home - discuss what this entails - [name] shares he would most certainly refuse this as well.   [name] asks that I call [name] daughter, [name], to discuss the above.   I shared above with [name] - shared concerns about support for [name] at home. Discussed likely eligible for hospice care. [name] agrees he needs support at home but also states he will refuse it all. She plans to call him and attempt to discuss further but is not hopeful that he will agree.   I did discuss with wife and daughter concerns about poor prognosis.  Discussed with patient/family the importance of continued conversation with family and the medical providers regarding overall plan of care and treatment options, ensuring decisions are within the context of the patient's values and GOCs.    Questions and concerns were addressed. The family was encouraged to call with questions or concerns.   Primary Decision Maker PATIENT  wife [name] would be HCPOA if patient unable  SUMMARY OF RECOMMENDATIONS   - patient not interested in discussing care, dismissed me after a few minutes -  patient refuses support at home - home health and hospice both, refuses rehab placement - family educated about concern of poor prognosis - family educated patient likely eligible for hospice support if becomes interested  Code Status/Advance Care Planning:  DNR   Symptom Management:   Currently denies dyspnea, If patient c/o shortness of breath despite current regimen and oxygen, would recommend low dose (1-2 mg) liquid morphine by mouth as needed for dyspnea  Prognosis:   Unable to determine  Discharge Planning: home - refusing home health      Primary Diagnoses: Present on Admission: **None**   I have reviewed the medical record, interviewed the patient and family, and examined the patient. The following aspects are pertinent.  Past Medical History:  Diagnosis Date  . Atrial septal defect    Closed with surgery January, 2010  . Automatic implantable cardioverter-defibrillator in situ    LV dysfunction and pacer needed for AV node lesion  . Chronic combined systolic and diastolic CHF (congestive heart failure) ([place])   . Colon polyps   . COPD (chronic obstructive pulmonary disease) (HCC)    O2- 2 liters, nasal cannula, q night   . CVA (cerebral vascular accident) ([place]) 2009   denies residual on 08/14/2013  . Diabetes mellitus without complication ([place])    type 2  . Dyslipidemia   . Endocarditis    Bacterial, 2009  . Hypertension   . Intracranial hemorrhage (HCC)    Coumadin cannot be used because of the history of his bleed  . Lung cancer ([place]) 11/29/2013   T1N0 Stage Ia non-small cell carcinoma left lung treated with wedge resection  . Myocardial infarction ([place]) 2010  . Pacemaker    combo pacer and icd  . Permanent atrial fibrillation    Originally Coumadin use for atrial fibrillation  //   he had intracerebral hemorrhage with an INR of 2.3 June, 2009. Anticoagulation could no longer be used.  //  Rapid atrial fibrillation after inferior MI October,  2010..........Marland Kitchen AV node ablation done at that time with ICD pacemaker placed (EF 35%).   //   Left atrial appendage tied off at the time of mitral valve surgery January, 2010 (maze pro  . Pneumonia 07/2018  . Pulmonary hypertension (HCC)    Moderate  . Renal artery stenosis (HCC)    Mild by history  . 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 Spontaneous pneumothorax    right thoracotomy - distant past  . Status post minimally invasive mitral valve replacement with bioprosthetic valve    33 mm Medtronic Mosaic porcine bioprosthesis placed via right mini thoracotomy for bacterial endocarditis complicated by severe MR and CHF   . Thoracic aortic aneurysm ([place]) 08/11/2016   a - Chest CTA 1/18:  Aneurysmal dilatation of aortic root is noted at 5.1 cm.    Social History   Socioeconomic History  . Marital status: Married    Spouse name: [name]  . Number of children: 2  . Years of  education: College  . Highest education level: Not on file  Occupational History  . 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 Smoking status: Former Smoker    Packs/day: 1.00    Years: 45.00    Pack years: 45.00    Types: Cigarettes    Quit date: 2011    Years since quitting: 10.3  . Smokeless tobacco: Never Used  Substance and Sexual Activity  . Alcohol use: No    Alcohol/week: 0.0 standard drinks    Comment: 08/14/2013 "used to drink beer; quit:in 1982"  . Drug use: No  . Sexual activity: Yes  Other Topics Concern  . Not on file  Social History Narrative   Patient lives at home with his spouse.   Worked for the post [employer]   Has two boys and a girl. All live local.    1 Mining engineer   Social Determinants of Health   Financial Resource Strain: Low Risk   . Difficulty of Paying Living Expenses: Not hard at all  Food Insecurity: No Food Insecurity  . Worried About Charity fundraiser in the Last Year: Never true  . Ran Out of Food in the Last Year:  Never true  Transportation Needs: No Transportation Needs  . Lack of Transportation (Medical): No  . Lack of Transportation (Non-Medical): No  Physical Activity: Inactive  . Days of Exercise per Week: 0 days  . Minutes of Exercise per Session: 0 min  Stress: No Stress Concern Present  . Feeling of Stress : Only a little  Social Connections: Unknown  . Frequency of Communication with Friends and Family: More than three times a week  . Frequency of Social Gatherings with Friends and Family: Three times a week  . Attends Religious Services: Not on file  . Active Member of Clubs or Organizations: Not on file  . Attends Archivist Meetings: Not on file  . Marital Status: Married   Family History  Problem Relation Age of Onset  . Stomach cancer Father   . Stroke Mother    Scheduled Meds: . acetaminophen  1,000 mg Oral Q8H  . alum & mag hydroxide-simeth  30 mL Oral BID  . aspirin EC  81 mg Oral Daily  . atorvastatin  10 mg Oral Daily  . bethanechol  10 mg Oral TID  . furosemide  60 mg Oral BID  . insulin aspart  0-5 Units Subcutaneous QHS  . insulin aspart  0-9 Units Subcutaneous TID WC  . insulin glargine  5 Units Subcutaneous Daily  . lidocaine  1 application Urethral Once  . mouth rinse  15 mL Mouth Rinse BID  . mometasone-formoterol  2 puff Inhalation BID  . tamsulosin  0.4 mg Oral QPC supper  . vitamin B-12  1,000 mcg Oral Daily   Continuous Infusions: PRN Meds:.albuterol, nitroGLYCERIN, senna-docusate, traMADol Allergies  Allergen Reactions  . Anticoagulant Compound Other (See Comments)    Pt had intracranial bleed, therefore all anticoagulation is contraindicated per Dr. Ron Parker  . Other Other (See Comments)    Per Dr. Halford Chessman (Surgeon): stated that the patient cannot be put under for any surgery, as he has an enlarged aorta. He would stand only a 50/50 chance of surviving. He has lung issues, diminished lung tissue, COPD, and emphysema.  . Warfarin Sodium  Other (See Comments)    Pt had intracranial bleed, therefore all anticoagulation is contraindicated per  Dr. Ron Parker   Review of Systems  Respiratory: Negative for shortness of breath.   All other systems reviewed and are negative.   Physical Exam Constitutional:      General: He is not in acute distress. Pulmonary:     Effort: Pulmonary effort is normal.     Comments: 5 L nasal cannula Musculoskeletal:     Right lower leg: No edema.     Left lower leg: No edema.  Skin:    General: Skin is warm and dry.  Neurological:     Mental Status: He is alert and oriented to person, place, and time.  Psychiatric:        Behavior: Behavior is withdrawn.     Vital Signs: BP (!) 142/64 (BP Location: Right Arm)   Pulse 74   Temp 97.7 F (36.5 C) (Oral)   Resp 18   Ht _0  (1.854 m)   Wt 71.4 kg   SpO2 99%   BMI 20.77 kg/m  Pain Scale: 0-10   Pain Score: 0-No pain   SpO2: SpO2: 99 % O2 Device:SpO2: 99 % O2 Flow Rate: .O2 Flow Rate (L/min): 5 L/min  IO: Intake/output summary:   Intake/Output Summary (Last 24 hours) at 11/24/2019 1450 Last data filed at 11/24/2019 1356 Gross per 24 hour  Intake 1080 ml  Output 2550 ml  Net -1470 ml    LBM: Last BM Date: 11/24/19 Baseline Weight: Weight: 72.2 kg(scale a) Most recent weight: Weight: 71.4 kg     Palliative Assessment/Data: PPS 60%    Time Total: 70 minutes Greater than 50%  of this time was spent counseling and coordinating care related to the above assessment and plan.  Juel Burrow, DNP, AGNP-C Palliative Medicine Team [phone] Pager: [phone]

## 2019-11-25 ENCOUNTER — Telehealth: Payer: Self-pay | Admitting: Adult Health

## 2019-11-25 DIAGNOSIS — Z789 Other specified health status: Secondary | ICD-10-CM

## 2019-11-25 DIAGNOSIS — J9601 Acute respiratory failure with hypoxia: Secondary | ICD-10-CM

## 2019-11-25 DIAGNOSIS — J449 Chronic obstructive pulmonary disease, unspecified: Secondary | ICD-10-CM

## 2019-11-25 DIAGNOSIS — I2721 Secondary pulmonary arterial hypertension: Secondary | ICD-10-CM

## 2019-11-25 LAB — HEMOGLOBIN AND HEMATOCRIT, BLOOD
HCT: 33.8 % — ABNORMAL LOW (ref 39.0–52.0)
Hemoglobin: 9.5 g/dL — ABNORMAL LOW (ref 13.0–17.0)

## 2019-11-25 LAB — RENAL FUNCTION PANEL
Albumin: 2.6 g/dL — ABNORMAL LOW (ref 3.5–5.0)
Anion gap: 8 (ref 5–15)
BUN: 17 mg/dL (ref 8–23)
CO2: 30 mmol/L (ref 22–32)
Calcium: 8.4 mg/dL — ABNORMAL LOW (ref 8.9–10.3)
Chloride: 100 mmol/L (ref 98–111)
Creatinine, Ser: 0.99 mg/dL (ref 0.61–1.24)
GFR calc Af Amer: >60 mL/min (ref >60)
GFR calc non Af Amer: >60 mL/min (ref >60)
Glucose, Bld: 136 mg/dL — ABNORMAL HIGH (ref 70–99)
Phosphorus: 4 mg/dL (ref 2.5–4.6)
Potassium: 4 mmol/L (ref 3.5–5.1)
Sodium: 138 mmol/L (ref 135–145)

## 2019-11-25 LAB — GLUCOSE, CAPILLARY
Glucose-Capillary: 130 mg/dL — ABNORMAL HIGH (ref 70–99)
Glucose-Capillary: 119 mg/dL — ABNORMAL HIGH (ref 70–99)

## 2019-11-25 LAB — MAGNESIUM: Magnesium: 2 mg/dL (ref 1.7–2.4)

## 2019-11-25 MED ORDER — ACETAMINOPHEN 500 MG PO TABS
1000.0000 mg | ORAL_TABLET | Freq: Three times a day (TID) | ORAL | 1 refills | Status: AC
Start: 2019-11-25 — End: 2020-01-24

## 2019-11-25 MED ORDER — BETHANECHOL CHLORIDE 10 MG PO TABS: 10.0000 mg | ORAL_TABLET | Freq: Three times a day (TID) | ORAL | 0 refills | Status: DC

## 2019-11-25 MED ORDER — FUROSEMIDE 20 MG PO TABS: 60.0000 mg | ORAL_TABLET | Freq: Two times a day (BID) | ORAL | 1 refills | Status: DC

## 2019-11-25 MED ORDER — CYANOCOBALAMIN 1000 MCG PO TABS: 1000.0000 ug | ORAL_TABLET | Freq: Every day | ORAL | 1 refills | Status: AC

## 2019-11-25 MED ORDER — TAMSULOSIN HCL 0.4 MG PO CAPS: 0.4000 mg | ORAL_CAPSULE | Freq: Every day | ORAL | 1 refills | Status: DC

## 2019-11-25 MED FILL — TAMSULOSIN HCL 0.4 MG CAP: 0.4 | 30 days supply | Qty: 30 | Fill #0 | Status: TO

## 2019-11-25 MED FILL — FUROSEMIDE 20 MG TAB: 20 | 90 days supply | Qty: 180 | Fill #0 | Status: TO

## 2019-11-25 MED FILL — BETHANECHOL 10 MG TABLET: 10 | 10 days supply | Qty: 30 | Fill #0

## 2019-11-25 NOTE — Progress Notes (Signed)
Discharge education and medication education given to patient and spouse with teach back. Education on when to call MD, low sodium heart healthy diet, and increasing activity slowly given. All questions and concerns answered. Peripheral IV and telemetry leads removed. All patient belongings given to patient. Oxygen tank and TOC medications delivered to patient's room. Patient transported to main entrance by nurse via wheelchair.

## 2019-11-25 NOTE — Discharge Summary (Signed)
Physician Discharge Summary  [name] MWU:[id number] DOB: 1945-12-15 DOA: 11/13/2019  PCP: [name], NP  Admit date: 11/13/2019 Discharge date: 11/25/2019  Admitted From: Home. Disposition: Home  Recommendations for Outpatient Follow-up:  1. Follow ups as below. 2. Please obtain CBC/BMP/Mag at follow up 3. Please follow up on the following pending results: None  Home Health: PT/OT/RN Equipment/Devices: Oxygen and 3 in 1 commode  Discharge Condition: Stable but poor prognosis CODE STATUS: DNR/DNI  Follow-up Information    [name], NP. Go on 12/03/2019.   Specialty: Family Medicine Why: '@2'$ :00pm Contact information: [place] [address] [phone]        [name], MD. Schedule an appointment as soon as possible for a visit on 12/11/2019.   Specialty: Cardiology Why: @ 10:20 AM Contact information: [address]  [place] [address] [facility].   Specialty: Home Health Services Why: HHRN arranged- they will contact you to set up home visits           Hospital Course: 74 year old male with history of COPD, pHTN, chronic RF on 4 L, CVA, ICH, HTN, HLD, type II DM, stage I lung cancer, aortic aneurysm, A. fib, chronic systolic CHF/ICD implant and LAA closure, Presented with complaints of shortness of breath found to have acute on chronic systolic CHF.  Started on IV Lasix. Cardiology consulted later in the course.  Increased IV Lasix to 60 mg twice daily.  Now on IV Lasix eventually transition to p.o. Lasix.  Despite excellent diuresis, he continued to require higher than baseline oxygen, 5 L (on 4 L at baseline) mainly due to his underlying chronic lung disease/COPD.    Family raised concern about ability to care for patient at home due to his frequent falls.   However, patient is not willing to consider SNF. Palliative care consulted given poor  prognosis but patient not interested in palliative care service or hospice.  Patient was discharged on oral Lasix 60 mg twice daily and 5 L oxygen.  Home health PT/OT/RN ordered on discharge.  See individual problem list below for more hospital course.  Discharge Diagnoses:  Acute on chronic hypoxic respiratory failure-multifactorial including CHF exacerbation, pHTN and underlying COPD.  On 4 L at baseline.  Requiring 5 L.  -Discharged on 5 L  Acute on chronic systolic CHF/NICM s/p BiV-ICD/pHTN: Echo with EF of 25 to 30%, diffuse hypokinesis, severe LAE and RAE, moderately reduced RVSF and severely enlarged RV.  Good UOP on p.o. Lasix 60 mg twice daily,   2.3 L / 24 hours and net -8 L.  BP and creatinine is stable.  -Appreciate cardiology's help -Discharged on p.o. Lasix 60 mg twice a day -TED hose  Advanced COPD: Chronic.  Seems to be playing a huge role in his O2 requirement. -Continue home meds  -Oxygen as above.  Permanent A. Fib. S/p AV ablation History of bioprosthetic MVR as well as LAA closure. -Not on any anticoagulation due to history of ICH. -Not taking any rate control medication for now.  Thoracic aortic aneurysm-followed by Dr. Roxy Manns.  Felt to be stable with no plan for surgical intervention given comorbidities -Outpatient follow-up  Hyperbilirubinemia: T bili 2.1> 1.5.  Indirect 1.2.  H&H stable.  Iron deficiency anemia: Baseline Hgb 10-11>8.7 (4/29)> 9.3 (admit)>> 8.5>>9.5.  Iron sat 3% with low ferritin and elevated TIBC. -IV Feraheme on 5/8 and 5/12. -Recheck CBC  at follow-up  Uncontrolled DM-2 with hyperglycemia and hyperlipidemia: A1c 7.4%. Recent Labs    11/24/19 2222 11/25/19 0620 11/25/19 1210  GLUCAP 184* 130* 119*  -Discharged on home medications  History of non-small cell lung cancer. -s/p wedge resection. -Outpatient follow-up  Concern about acute urinary retention on bladder scan: refused in and out cath but seems to be voiding okay.   No suprapubic tenderness. -Discontinued Robaxin and oxycodone, and scheduled Tylenol for pain -Continue Flomax and bethanechol  Atypical chest pain: Resolved.  Troponin x3 not consistent with ACS. EKG nonischemic.  -Continue scheduled Tylenol  Low back pain/left shoulder pain-lumbar spine, shoulder, clavicle and chest film negative for acute finding.  Pain seems to have resolved. -X-ray lumbar spine shows evidence of arthritis. No acute fracture.  -Continue Tylenol 1 g 3 times daily  Near syncope-some concern about vasovagal -Continue monitoring -Continue TED hose-change to thigh-high.  Debility/physical deconditioning: Wife concerned about providing care at home -Home health PT/OT with DME ordered   Goal of care/DNR/DNI-patient with significant cardiopulmonary comorbidities.  Poor prognosis.  Met with palliative care but not interested in palliative care or hospice service.                Discharge Exam: Vitals:   11/25/19 0441 11/25/19 1208  BP: 103/64 109/62  Pulse: 73 73  Resp: 18 15  Temp: 98 F (36.7 C) 98.5 F (36.9 C)  SpO2: 92% 91%    GENERAL: No apparent distress.  Nontoxic. HEENT: MMM.  Vision and hearing grossly intact.  NECK: Supple.  No apparent JVD.  RESP: On 5 L no IWOB.  Fair aeration with bibasilar crackles and rhonchi. CVS:  RRR. Heart sounds normal.  ABD/GI/GU: Bowel sounds present. Soft. Non tender.  MSK/EXT:  Moves extremities. No apparent deformity.  1+ edema bilaterally SKIN: no apparent skin lesion or wound NEURO: Awake, alert and oriented appropriately.  No apparent focal neuro deficit. PSYCH: Calm. Normal affect.  Discharge Instructions  Discharge Instructions    (HEART FAILURE PATIENTS) Call MD:  Anytime you have any of the following symptoms: 1) 3 pound weight gain in 24 hours or 5 pounds in 1 week 2) shortness of breath, with or without a dry hacking cough 3) swelling in the hands, feet or stomach 4) if you have to sleep on  extra pillows at night in order to breathe.   Complete by: As directed    Call MD for:  difficulty breathing, headache or visual disturbances   Complete by: As directed    Call MD for:  extreme fatigue   Complete by: As directed    Call MD for:  persistant dizziness or light-headedness   Complete by: As directed    Diet - low sodium heart healthy   Complete by: As directed    Diet Carb Modified   Complete by: As directed    Discharge instructions   Complete by: As directed    It has been a pleasure taking care of you! You were hospitalized for shortness of breath likely due to heart failure exacerbation and COPD.  You were treated for both conditions with improvement in his symptoms to the point we think it is safe to let you go home and follow-up with your doctors.  We made some adjustment to your home medications during this hospitalization. Please review your new medication list and the directions before you take your medications.  Please follow-up with your primary care doctor, cardiologist the lung doctor in 1 to 2 weeks.  Take care,   Increase activity slowly   Complete by: As directed      Allergies as of 11/25/2019      Reactions   Anticoagulant Compound Other (See Comments)   Pt had intracranial bleed, therefore all anticoagulation is contraindicated per Dr. Ron Parker   Other Other (See Comments)   Per Dr. Halford Chessman (Surgeon): stated that the patient cannot be put under for any surgery, as he has an enlarged aorta. He would stand only a 50/50 chance of surviving. He has lung issues, diminished lung tissue, COPD, and emphysema.   Warfarin Sodium Other (See Comments)   Pt had intracranial bleed, therefore all anticoagulation is contraindicated per Dr. Ron Parker      Medication List    STOP taking these medications   amoxicillin 500 MG capsule Commonly known as: AMOXIL   budesonide-formoterol 160-4.5 MCG/ACT inhaler Commonly known as: Symbicort   OXYGEN     TAKE these  medications   acetaminophen 500 MG tablet Commonly known as: TYLENOL Take 2 tablets (1,000 mg total) by mouth every 8 (eight) hours. Notes to patient: **NEW** For pain. Do not take more than '3000mg'$  per day   aspirin EC 81 MG tablet Take 1 tablet (81 mg total) by mouth daily.   atorvastatin 10 MG tablet Commonly known as: LIPITOR Take 10 mg by mouth daily.   bethanechol 10 MG tablet Commonly known as: URECHOLINE Take 1 tablet (10 mg total) by mouth 3 (three) times daily. Notes to patient: **NEW** To help with urine retention. May cause drowsiness and dizziness   Breztri Aerosphere 160-9-4.8 MCG/ACT Aero Generic drug: Budeson-Glycopyrrol-Formoterol Inhale 2 puffs into the lungs in the morning and at bedtime.   cyanocobalamin 1000 MCG tablet Take 1 tablet (1,000 mcg total) by mouth daily. Notes to patient: **NEW** Vitamin B12 supplement   furosemide 20 MG tablet Commonly known as: Lasix Take 3 tablets (60 mg total) by mouth 2 (two) times daily. What changed:   medication strength  how much to take  when to take this Notes to patient: **INCREASED** To prevent fluid accumulation   metFORMIN 1000 MG tablet Commonly known as: GLUCOPHAGE TAKE 1 TABLET BY MOUTH TWICE A DAY WITH MEALS   metoprolol succinate 25 MG 24 hr tablet Commonly known as: TOPROL-XL Take 0.5 tablets (12.5 mg total) by mouth daily.   nitroGLYCERIN 0.4 MG SL tablet Commonly known as: NITROSTAT Place 0.4 mg under the tongue every 5 (five) minutes x 3 doses as needed for chest pain.   potassium chloride SA 20 MEQ tablet Commonly known as: KLOR-CON Take 2 tablets (40 mEq total) by mouth daily.   tamsulosin 0.4 MG Caps capsule Commonly known as: FLOMAX Take 1 capsule (0.4 mg total) by mouth daily after supper. Notes to patient: **NEW** To help with urine retention. May cause dizziness and drowsiness            Durable Medical Equipment  (From admission, onward)         Start     Ordered    11/25/19 0736  For home use only DME Bedside commode  Once    Comments: 3 in 1 commode  Question:  Patient needs a bedside commode to treat with the following condition  Answer:  Unsteady gait   11/25/19 0735   11/25/19 0736  For home use only DME oxygen  Once    Question Answer Comment  Length of Need Lifetime   Mode or (Route) Nasal cannula   Liters per Minute  5   Frequency Continuous (stationary and portable oxygen unit needed)   Oxygen conserving device Yes   Oxygen delivery system Gas      11/25/19 0735          Consultations:  Cardiology  Palliative care medicine  Procedures/Studies:  TTE on 11/14/2019 1. LVEF is severely depressed with diffuse hypokinesis, worse in the  septal and apical walls Compared to report from Nov 2020 echo, LVEF is  worse (apical views foreshortened in 2020 exam which makes assessment  difficult). Left ventricular ejection  fraction, by estimation, is 25 to 30%. The left ventricle has severely  decreased function. The left ventricle demonstrates global hypokinesis.  There is mild left ventricular hypertrophy. Left ventricular diastolic  parameters are indeterminate.  2. Right ventricular systolic function is moderately reduced. The right  ventricular size is severely enlarged. There is moderately elevated  pulmonary artery systolic pressure.  3. Left atrial size was severely dilated.  4. Right atrial size was severely dilated.  5. A 33 mm Medtronic Mosai bioprosthesisiis present Peak and mean  gradients through the vave are 15 and 6 mm Hg respectively, both lower  than previous echo report in 2020.Marland Kitchen The mitral valve has been  repaired/replaced. Trivial mitral valve regurgitation.  6. The aortic valve is abnormal. Aortic valve regurgitation is not  visualized. Mild to moderate aortic valve sclerosis/calcification is  present, without any evidence of aortic stenosis.  7. Aortic root is 51 mm Previous echo measured echo above this;  current  exam did not image that high.. Aortic dilatation noted. Aneurysm of the  aortic root. There is moderate to severe dilatation of the aortic root  measuring 51 mm.  8. The inferior vena cava is dilated in size with <50% respiratory  variability, suggesting right atrial pressure of 15 mmHg.   DG Chest 2 View  Result Date: 11/13/2019 CLINICAL DATA:  Shortness of breath. EXAM: CHEST - 2 VIEW COMPARISON:  November 07, 2019. FINDINGS: Stable cardiomegaly. Right-sided pacemaker is unchanged in position. No pneumothorax is noted. Stable bilateral perihilar and basilar lung opacities are noted concerning for possible edema or atypical inflammation with associated pleural effusions. Bony thorax is unremarkable. IMPRESSION: Stable bilateral perihilar and basilar lung opacities are noted concerning for possible edema or atypical inflammation with associated pleural effusions. Electronically Signed   By: Marijo Conception M.D.   On: 11/13/2019 15:38   DG Chest 2 View  Result Date: 11/07/2019 CLINICAL DATA:  Shortness of breath. Central chest pain started around noon today. History of atrial septal defect. EXAM: CHEST - 2 VIEW COMPARISON:  06/07/2019 FINDINGS: Heart is enlarged and stable in configuration. Patient has a RIGHT-sided pacemaker with leads to the RIGHT ventricle and coronary sinus. There are apical bullous changes, LEFT greater than RIGHT. Chronic lung opacity identified at the RIGHT lung base. There is stable elevation of LEFT hemidiaphragm. IMPRESSION: Stable cardiomegaly. Stable emphysematous changes and pulmonary opacities. Electronically Signed   By: Nolon Nations M.D.   On: 11/07/2019 15:00   DG Lumbar Spine 2-3 Views  Result Date: 11/16/2019 CLINICAL DATA:  Lower back pain. EXAM: LUMBAR SPINE - 2-3 VIEW COMPARISON:  None. FINDINGS: There is no evidence of an acute lumbar spine fracture. Chronic loss of vertebral body height is seen at the levels of T12 and L1. Approximately 2 mm  retrolisthesis of the L3 vertebral body is noted on L4. Approximately 2 mm anterolisthesis of the L4 vertebral body is seen on L5. There is moderate severity multilevel  endplate sclerosis with moderate severity multilevel intervertebral disc space narrowing. Marked severity calcification of the abdominal aorta and bilateral common iliac arteries is seen. IMPRESSION: 1. Chronic loss of vertebral body height at T12 and L1. 2. Moderate severity multilevel degenerative disc disease. 3. Marked severity atherosclerotic calcification of the abdominal aorta and bilateral common iliac arteries. Electronically Signed   By: Virgina Norfolk M.D.   On: 11/16/2019 19:13   DG Clavicle Left  Result Date: 11/18/2019 CLINICAL DATA:  Left shoulder pain EXAM: LEFT CLAVICLE - 2+ VIEWS COMPARISON:  None. FINDINGS: Negative for fracture AC degenerative change in spurring. Narrowing of the acromial humeral distance compatible with rotator cuff disease. Mild degenerative change in the shoulder joint. Staples in the left upper lobe. IMPRESSION: Rotator cuff disease.  Negative for fracture. Electronically Signed   By: Franchot Gallo M.D.   On: 11/18/2019 12:25   DG Chest Port 1 View  Result Date: 11/17/2019 CLINICAL DATA:  Shortness of breath. EXAM: PORTABLE CHEST 1 VIEW COMPARISON:  Nov 15, 2019 FINDINGS: Right-sided cardiac pacemaker/defibrillator is in stable position. The cardiac silhouette is stably enlarged. Moderately increased interstitial markings. Small bilateral pleural effusions. Bullous emphysematous changes in the upper lobes again noted. Osseous structures are without acute abnormality. Soft tissues are grossly normal. IMPRESSION: 1. Stably enlarged cardiac silhouette. 2. Moderate interstitial pulmonary edema with small bilateral pleural effusions. Electronically Signed   By: Fidela Salisbury M.D.   On: 11/17/2019 09:28   DG CHEST PORT 1 VIEW  Result Date: 11/15/2019 CLINICAL DATA:  Shortness of breath.  Chest  pain. EXAM: PORTABLE CHEST 1 VIEW COMPARISON:  11/13/2019.  CT 11/07/2019. FINDINGS: AICD noted stable position. Cardiomegaly. Surgical sutures left lung. Stable changes of bullous COPD. Diffuse bilateral pulmonary infiltrates/edema. Small right pleural effusion. Stable elevation left hemidiaphragm. IMPRESSION: 1.  AICD noted in stable position. 2. Cardiomegaly. Diffuse bilateral pulmonary infiltrates/edema again noted. Small right pleural effusion. 3. Stable changes of bullous COPD. Stable elevation left hemidiaphragm. Electronically Signed   By: Marcello Moores  Register   On: 11/15/2019 11:59   DG Shoulder Left  Result Date: 11/18/2019 CLINICAL DATA:  Left shoulder pain EXAM: LEFT SHOULDER - 2+ VIEW COMPARISON:  None. FINDINGS: Negative for fracture or dislocation. Degenerative change in spurring in the [facility] joint. Irregularity of the greater tuberosity compatible with rotator cuff disease. Narrowing of the acromial humeral distance compatible with rotator cuff disease. Left upper lobe lobectomy clips are present. IMPRESSION: Degenerative change in the left shoulder with rotator cuff disease. No acute fracture. Electronically Signed   By: Franchot Gallo M.D.   On: 11/18/2019 12:24   ECHOCARDIOGRAM COMPLETE  Result Date: 11/14/2019    ECHOCARDIOGRAM REPORT   Patient Name:   [name] Date of Exam: 11/14/2019 Medical Rec #:  [id number]     Height:       73.0 in Accession #:    [id number]    Weight:       159.1 lb Date of Birth:  05-Oct-1945    BSA:          1.952 m Patient Age:    28 years      BP:           119/67 mmHg Patient Gender: M             HR:           67 bpm. Exam Location:  Inpatient Procedure: 2D Echo and Intracardiac Opacification Agent Indications:    CHF-Acute Systolic 683.41 / D62.22  History:        Patient has prior history of Echocardiogram examinations, most                 recent 06/07/2019. Cardiomyopathy, ICD (implantable                 cardioverter-defibrillator), S/P MVR (mitral valve  replacement),                 COPD, Stroke and Pulmonary HTN; Risk Factors:Hypertension and                 Dyslipidemia. Elevated troponin.  Sonographer:    Vikki Ports Turrentine Referring Phys: [id number] Truddie Hidden  Sonographer Comments: Technically difficult study due to poor echo windows. IMPRESSIONS  1. LVEF is severely depressed with diffuse hypokinesis, worse in the septal and apical walls Compared to report from Nov 2020 echo, LVEF is worse (apical views foreshortened in 2020 exam which makes assessment difficult). Left ventricular ejection fraction, by estimation, is 25 to 30%. The left ventricle has severely decreased function. The left ventricle demonstrates global hypokinesis. There is mild left ventricular hypertrophy. Left ventricular diastolic parameters are indeterminate.  2. Right ventricular systolic function is moderately reduced. The right ventricular size is severely enlarged. There is moderately elevated pulmonary artery systolic pressure.  3. Left atrial size was severely dilated.  4. Right atrial size was severely dilated.  5. A 33 mm Medtronic Mosai bioprosthesisiis present Peak and mean gradients through the vave are 15 and 6 mm Hg respectively, both lower than previous echo report in 2020.Marland Kitchen The mitral valve has been repaired/replaced. Trivial mitral valve regurgitation.  6. The aortic valve is abnormal. Aortic valve regurgitation is not visualized. Mild to moderate aortic valve sclerosis/calcification is present, without any evidence of aortic stenosis.  7. Aortic root is 51 mm Previous echo measured echo above this; current exam did not image that high.. Aortic dilatation noted. Aneurysm of the aortic root. There is moderate to severe dilatation of the aortic root measuring 51 mm.  8. The inferior vena cava is dilated in size with <50% respiratory variability, suggesting right atrial pressure of 15 mmHg. FINDINGS  Left Ventricle: LVEF is severely depressed with diffuse hypokinesis, worse  in the septal and apical walls Compared to report from Nov 2020 echo, LVEF is worse (apical views foreshortened in 2020 exam which makes assessment difficult). Left ventricular ejection fraction, by estimation, is 25 to 30%. The left ventricle has severely decreased function. The left ventricle demonstrates global hypokinesis. Definity contrast agent was given IV to delineate the left ventricular endocardial borders. The left ventricular internal cavity size was normal in size. There is mild left ventricular hypertrophy. Left ventricular diastolic parameters are indeterminate. Right Ventricle: The right ventricular size is severely enlarged. Right vetricular wall thickness was not assessed. Right ventricular systolic function is moderately reduced. There is moderately elevated pulmonary artery systolic pressure. The tricuspid regurgitant velocity is 3.04 m/s, and with an assumed right atrial pressure of 10 mmHg, the estimated right ventricular systolic pressure is 19.6 mmHg. Left Atrium: Left atrial size was severely dilated. Right Atrium: Right atrial size was severely dilated. Pericardium: There is no evidence of pericardial effusion. Mitral Valve: A 33 mm Medtronic Mosai bioprosthesisiis present Peak and mean gradients through the vave are 15 and 6 mm Hg respectively, both lower than previous echo report in 2020. The mitral valve has been repaired/replaced. Trivial mitral valve regurgitation. MV peak gradient, 15.1 mmHg. The mean mitral valve gradient is 6.0 mmHg.  Tricuspid Valve: The tricuspid valve is normal in structure. Tricuspid valve regurgitation is trivial. Aortic Valve: The aortic valve is abnormal. Aortic valve regurgitation is not visualized. Mild to moderate aortic valve sclerosis/calcification is present, without any evidence of aortic stenosis. Pulmonic Valve: The pulmonic valve was not well visualized. Pulmonic valve regurgitation is not visualized. Aorta: Aortic root is 51 mm Previous echo  measured echo above this; current exam did not image that high. Aortic dilatation noted. There is moderate to severe dilatation of the aortic root measuring 51 mm. There is an aneurysm involving the aortic root. Venous: The inferior vena cava is dilated in size with less than 50% respiratory variability, suggesting right atrial pressure of 15 mmHg. IAS/Shunts: The interatrial septum was not assessed. Additional Comments: A pacer wire is visualized.  LEFT VENTRICLE PLAX 2D LVIDd:         5.10 cm LVIDs:         4.30 cm LV PW:         1.30 cm LV IVS:        1.30 cm LVOT diam:     2.30 cm LV SV:         50 LV SV Index:   26 LVOT Area:     4.15 cm  RIGHT VENTRICLE RV S prime:     4.61 cm/s TAPSE (M-mode): 0.6 cm LEFT ATRIUM            Index        RIGHT ATRIUM           Index LA diam:      6.50 cm  3.33 cm/m   RA Area:     30.00 cm LA Vol (A2C): 195.0 ml 99.88 ml/m  RA Volume:   97.60 ml  49.99 ml/m LA Vol (A4C): 257.0 ml 131.64 ml/m  AORTIC VALVE LVOT Vmax:   77.40 cm/s LVOT Vmean:  56.900 cm/s LVOT VTI:    0.120 m  AORTA Ao Root diam: 5.00 cm MITRAL VALVE             TRICUSPID VALVE MV Peak grad: 15.1 mmHg  TR Peak grad:   37.0 mmHg MV Mean grad: 6.0 mmHg   TR Vmax:        304.00 cm/s MV Vmax:      1.94 m/s MV Vmean:     108.0 cm/s SHUNTS                          Systemic VTI:  0.12 m                          Systemic Diam: 2.30 cm Dorris Carnes MD Electronically signed by Dorris Carnes MD Signature Date/Time: 11/14/2019/6:50:18 PM    Final    CT Angio Chest/Abd/Pel for Dissection W and/or Wo Contrast  Result Date: 11/07/2019 CLINICAL DATA:  Chest pain or back pain, aortic dissection suspected chest pain, back pain, known thoracic aortic root dilation, aneurysm, concern for acute aortic dissection or other acute aortic pathology EXAM: CT ANGIOGRAPHY CHEST, ABDOMEN AND PELVIS TECHNIQUE: Non-contrast CT of the chest was initially obtained. Multidetector CT imaging through the chest, abdomen and pelvis was performed  using the standard protocol during bolus administration of intravenous contrast. Multiplanar reconstructed images and MIPs were obtained and reviewed to evaluate the vascular anatomy. CONTRAST:  143m OMNIPAQUE IOHEXOL 350 MG/ML SOLN COMPARISON:  Radiograph earlier this day. Chest CT 07/30/2019,  additional priors. 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 This measures approximately 5.1 cm when measured on coronal reformats series 8, image 43. No evidence of aneurysm rupture, stranding, or inflammation. Remainder of the thoracic aorta is normal in caliber. There is no ascending, transverse, or descending aortic aneurysm. Mild atherosclerosis. No aortic dissection, acute aortic syndrome, or aortic hematoma on noncontrast exam. Scattered coronary artery calcifications. Cardiomegaly with marked left atrial dilatation. Borders of the left atrial appendage are diffusely lobulated and thickened, potentially left atrial thrombus measuring 12 mm. There is a prosthetic mitral valve. Right-sided pacemaker in place. No pericardial effusion. Limited assessment for pulmonary embolism, no filling defects within the central pulmonary arteries. Marked pulmonary artery enlargement involving the right greater than left main pulmonary artery. Mediastinum/Nodes: Mediastinal adenopathy with index nodes as follows, correlating with prior exam. AP window node measures 1.5 cm, series 5, image 42, unchanged. Azagoesophageal recess node measures 1.7 cm, series 5, image 60, unchanged. Left supraclavicular node measures 1.4 cm, series 5, image 11, previously 1.2 cm. No definite new or progressive adenopathy. Decompressed esophagus. No visualized thyroid nodule. Lungs/Pleura: Advanced emphysema. Lobulated right pleural thickening/fluid has slightly progressed from prior exam. There is new fluid or thickening involving the right major fissure. Chain sutures noted in the left upper  lung. Ground-glass and interstitial attenuation involving the dependent lower lobes with seen on prior exam, slightly progressed. There is central bronchial thickening with areas of mucous plugging in the lower lobes. Calcified granuloma in the right lung. Musculoskeletal: Bones are diffusely under mineralized. No focal bone lesion. Remote right rib fractures, postsurgical change involving right ribs. Review of the MIP images confirms the above findings. CTA ABDOMEN AND PELVIS FINDINGS VASCULAR Aorta: Moderate atherosclerosis without aneurysm, dissection, vasculitis or acute aortic finding. Celiac: Patent without evidence of aneurysm, dissection, vasculitis or significant stenosis. SMA: Patent without evidence of aneurysm, dissection, vasculitis or significant stenosis. There is a replaced hepatic artery arises from the SMA. Renals: Both renal arteries are patent without evidence of aneurysm, dissection, vasculitis, fibromuscular dysplasia or significant stenosis. There are accessory bilateral renal arteries to the lower poles which are also patent. IMA: Patent without evidence of aneurysm, dissection, vasculitis or significant stenosis. Inflow: Advanced calcified plaque without aneurysm, dissection, severe stenosis or vasculitis. Veins: No obvious venous abnormality within the limitations of this arterial phase study. Review of the MIP images confirms the above findings. NON-VASCULAR Hepatobiliary: Evaluation for focal lesion is limited on arterial phase imaging. There are nodular hepatic contours. No evidence of focal lesion. Partially distended gallbladder without calcified gallstone. Pancreas: No ductal dilatation or inflammation. Spleen: Normal in size and arterial enhancement. Adrenals/Urinary Tract: No adrenal nodule. No hydronephrosis. No perinephric edema. Bilateral renal cortical cysts of varying sizes. Mild bladder distention without wall thickening. Stomach/Bowel: Bowel evaluation is limited in the  absence of enteric contrast, arterial phase imaging, and paucity of intra-abdominal fat. Stomach is decompressed. No bowel dilatation or obstruction. No bowel inflammation. There are areas of mesenteric swirling but no evidence of ischemia or vascular compromise. The appendix is not definitively visualized. Lymphatic: No abdominopelvic adenopathy. Reproductive: Enlarged prostate gland causes mass effect on the bladder base. Penile prosthesis. Other: No ascites or free air. Musculoskeletal: Mild T12 and L1 superior endplate compression fractures. Mild L4 superior endplate compression fracture. Multilevel degenerative change in the lumbar spine with prominent degenerative disc disease and facet hypertrophy. Grade 1 anterolisthesis of L4 on L5 and retrolisthesis of L3 on L4 likely degenerative  and facet mediated. No evidence of focal bone lesion. Bones are diffusely under mineralized. Review of the MIP images confirms the above findings. IMPRESSION: 1. No aortic dissection, acute aortic syndrome, or acute aortic abnormality. 2. 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 3. Cardiomegaly with marked left atrial dilatation. Borders of the left atrial appendage are diffusely lobulated and thickened, potentially left atrial appendage thrombus measuring 12 mm. Recommend correlation with echocardiogram. 4. Advanced emphysema. 5. Small right pleural effusion which is partially lobulated, now with fluid in the major fissure. Trace left pleural effusion. 6. Mediastinal adenopathy stable from prior exam. Left supraclavicular node has slightly increased in size. Recommend attention at follow-up. 7. Enlarged prostate gland causes mass effect on the bladder base. 8. Mild T12 and L1 superior endplate compression fractures. Mild L4 superior endplate compression fracture. These fractures are age indeterminate. Aortic Atherosclerosis (ICD10-I70.0) and Emphysema (ICD10-J43.9). Electronically Signed   By: Keith Rake M.D.    On: 11/07/2019 20:56   VAS [place] LOWER EXTREMITY VENOUS (DVT) (ONLY [facility] & [facility])  Result Date: 11/13/2019  Lower Venous DVTStudy Indications: Edema.  Performing Technologist: June Leap RDMS, RVT  Examination Guidelines: A complete evaluation includes B-mode imaging, spectral Doppler, color Doppler, and power Doppler as needed of all accessible portions of each vessel. Bilateral testing is considered an integral part of a complete examination. Limited examinations for reoccurring indications may be performed as noted. The reflux portion of the exam is performed with the patient in reverse Trendelenburg.  +---------+---------------+---------+-----------+----------+--------------+ RIGHT    CompressibilityPhasicitySpontaneityPropertiesThrombus Aging +---------+---------------+---------+-----------+----------+--------------+ CFV      Full           Yes      Yes                                 +---------+---------------+---------+-----------+----------+--------------+ SFJ      Full                                                        +---------+---------------+---------+-----------+----------+--------------+ FV Prox  Full                                                        +---------+---------------+---------+-----------+----------+--------------+ FV Mid   Full                                                        +---------+---------------+---------+-----------+----------+--------------+ FV DistalFull                                                        +---------+---------------+---------+-----------+----------+--------------+ PFV      Full                                                        +---------+---------------+---------+-----------+----------+--------------+  POP      Full           Yes      Yes                                 +---------+---------------+---------+-----------+----------+--------------+ PTV      Full                                                         +---------+---------------+---------+-----------+----------+--------------+ PERO     Full                                                        +---------+---------------+---------+-----------+----------+--------------+   +---------+---------------+---------+-----------+----------+--------------+ LEFT     CompressibilityPhasicitySpontaneityPropertiesThrombus Aging +---------+---------------+---------+-----------+----------+--------------+ CFV      Full           Yes      Yes                                 +---------+---------------+---------+-----------+----------+--------------+ SFJ      Full                                                        +---------+---------------+---------+-----------+----------+--------------+ FV Prox  Full                                                        +---------+---------------+---------+-----------+----------+--------------+ FV Mid   Full                                                        +---------+---------------+---------+-----------+----------+--------------+ FV DistalFull                                                        +---------+---------------+---------+-----------+----------+--------------+ PFV      Full                                                        +---------+---------------+---------+-----------+----------+--------------+ POP      Full           Yes      Yes                                 +---------+---------------+---------+-----------+----------+--------------+  PTV      Full                                                        +---------+---------------+---------+-----------+----------+--------------+ PERO     Full                                                        +---------+---------------+---------+-----------+----------+--------------+     Summary: RIGHT: - There is no evidence of deep vein thrombosis in the lower extremity.  -  No cystic structure found in the popliteal fossa.  LEFT: - There is no evidence of deep vein thrombosis in the lower extremity.  - No cystic structure found in the popliteal fossa.  *See table(s) above for measurements and observations. Electronically signed by Harold Barban MD on 11/13/2019 at 8:39:17 PM.    Final         The results of significant diagnostics from this hospitalization (including imaging, microbiology, ancillary and laboratory) are listed below for reference.     Microbiology: No results found for this or any previous visit (from the past 240 hour(s)).   Labs: BNP (last 3 results) Recent Labs    04/27/19 1502 06/07/19 1007 11/13/19 1550  BNP 630.0* 1,011.1* 2,725.3*   Basic Metabolic Panel: Recent Labs  Lab 11/21/19 0735 11/22/19 0625 11/23/19 0633 11/24/19 0324 11/25/19 0449  NA 138 137 136 136 138  K 3.8 3.6 4.1 3.7 4.0  CL 100 102 99 99 100  CO2 '28 27 30 28 30  '$ GLUCOSE 153* 118* 111* 126* 136*  BUN '17 16 14 16 17  '$ CREATININE 1.09 0.86 0.83 0.83 0.99  CALCIUM 8.7* 8.4* 8.4* 8.5* 8.4*  MG 1.9 2.1 2.1 2.0 2.0  PHOS 3.3 3.5 3.7 4.0 4.0   Liver Function Tests: Recent Labs  Lab 11/20/19 0820 11/20/19 0820 11/21/19 0735 11/22/19 0625 11/23/19 0633 11/24/19 0324 11/25/19 0449  AST 12*  --  14*  --   --   --   --   ALT 15  --  16  --   --   --   --   ALKPHOS 64  --  66  --   --   --   --   BILITOT 1.7*  --  2.1* 1.5*  --   --   --   PROT 5.6*  --  6.2*  --   --   --   --   ALBUMIN 2.7*   < > 3.0* 2.5* 2.6* 2.8* 2.6*   < > = values in this interval not displayed.   No results for input(s): LIPASE, AMYLASE in the last 168 hours. No results for input(s): AMMONIA in the last 168 hours. CBC: Recent Labs  Lab 11/19/19 0507 11/20/19 0820 11/23/19 0633 11/25/19 0449  WBC 6.8 7.2 5.9  --   NEUTROABS  --  5.9  --   --   HGB 8.6* 8.5* 8.7* 9.5*  HCT 30.7* 31.2* 32.3* 33.8*  MCV 75.6* 76.8* 79.6*  --   PLT 184 198 222  --    Cardiac  Enzymes: No results for input(s): CKTOTAL, CKMB, CKMBINDEX, TROPONINI in the last  168 hours. BNP: Invalid input(s): POCBNP CBG: Recent Labs  Lab 11/24/19 1134 11/24/19 1644 11/24/19 2222 11/25/19 0620 11/25/19 1210  GLUCAP 127* 181* 184* 130* 119*   D-Dimer No results for input(s): DDIMER in the last 72 hours. Hgb A1c No results for input(s): HGBA1C in the last 72 hours. Lipid Profile No results for input(s): CHOL, HDL, LDLCALC, TRIG, CHOLHDL, LDLDIRECT in the last 72 hours. Thyroid function studies No results for input(s): TSH, T4TOTAL, T3FREE, THYROIDAB in the last 72 hours.  Invalid input(s): FREET3 Anemia work up No results for input(s): VITAMINB12, FOLATE, FERRITIN, TIBC, IRON, RETICCTPCT in the last 72 hours. Urinalysis    Component Value Date/Time   COLORURINE YELLOW 06/07/2019 1231   APPEARANCEUR CLOUDY (A) 06/07/2019 1231   LABSPEC 1.013 06/07/2019 1231   PHURINE 5.0 06/07/2019 1231   GLUCOSEU 150 (A) 06/07/2019 1231   HGBUR SMALL (A) 06/07/2019 1231   HGBUR negative 04/05/2010 0837   BILIRUBINUR NEGATIVE 06/07/2019 1231   BILIRUBINUR n 03/27/2012 1323   KETONESUR NEGATIVE 06/07/2019 1231   PROTEINUR NEGATIVE 06/07/2019 1231   UROBILINOGEN 1.0 11/28/2013 1000   NITRITE NEGATIVE 06/07/2019 1231   LEUKOCYTESUR LARGE (A) 06/07/2019 1231   Sepsis Labs Invalid input(s): PROCALCITONIN,  WBC,  LACTICIDVEN   Time coordinating discharge: 45 minutes  SIGNED:  Mercy Riding, MD  [facility] 11/25/2019, 3:31 PM  If 7PM-7AM, please contact night-coverage [email or web address] Password [email or web address]

## 2019-11-25 NOTE — Telephone Encounter (Signed)
Pt was released from hospital and failed to get Rx metoprolol succinate 25 MG and wanted to know if this can be called in.  Pts wife is aware that the provider and CMA is not in the office today and will call back on tomorrow.  Pt has 40 MG of furosemide on hand and would like to see if they should throw it away since it has been increased or what should they do. Pts oxygen level has been increased to 5 from 4.  Pt and wife would like to have a call back to discuss.

## 2019-11-25 NOTE — Care Management Important Message (Signed)
Important Message  Patient Details  Name: TIMTOHY BROSKI MRN: [id number] Date of Birth: 1945/10/17   Medicare Important Message Given:  Yes     Shelda Altes 11/25/2019, 10:36 AM

## 2019-11-25 NOTE — Progress Notes (Signed)
[facility] ([facility])  Hospital Liaison: RN note         Notified by TOC manager of patient/family request for [facility] Palliative services at home after discharge.                  [facility] Palliative team will follow up with patient after discharge.         Please call with any hospice or palliative related questions.         Thank you for this referral.         Mary Anne Robertson, RN, CCM  [facility] Hospital Liaison (listed on AMION under [facility]ospice[facility])    [phone]   

## 2019-11-25 NOTE — Progress Notes (Signed)
Heart Failure Stewardship Pharmacist Progress Note   PCP: [name], NP PCP-Cardiologist: Candee Furbish, MD    HPI:  74 yo M with PMH significant of chronic hypoxemic respiratory failure on 4 L of home oxygen, systolic heart failure, history of CVA, ICH, HTN, HLD, T2 DM, lung cancer stage Ia, thoracic aortic aneurysm, permanent atrial fibrillation s/p ICD, LAA closure who presented to ED from PCP office on 5/5 with worsening shortness of breath and edema.  ECHO on 11/14/19 show LVEF of 25-30% with worsening diffuse hypokinesis.   Patient has been receiving IV diuresis throughout admission. Transitioned to PO furosemide on 5/16. Good urine output yesterday.   Current HF Medications: Furosemide 60 mg PO BID  Prior to admission HF Medications: Furosemide 40 mg daily Metoprolol succinate - not taking   Pertinent Lab Values: . Serum creatinine 0.99, BUN 17, Potassium 4.0, Sodium 138, BNP 1448.6, Magnesium 2.0  Vital Signs: . Weight: 158 lb (estimated dry weight: ~155 lbs) . Blood pressure: 110/60s  . Heart rate: 70s  Medication Assistance / Insurance Benefits Check: Does the patient have prescription insurance?   yes Type of insurance plan: Medicare + [place]  Does the patient qualify for medication assistance through manufacturers or grants?   Pending household income information  Eligible grants and/or patient assistance programs: none with current HF regimen  Medication assistance applications in progress: none  Medication assistance applications approved: none   Approved medication assistance renewals will be completed by: Dr. Marlou Porch office  Outpatient Pharmacy:  Prior to admission outpatient pharmacy: [facility] Is the patient willing to use [facility] at discharge? No Is the patient willing to transition their outpatient pharmacy to utilize a [facility] outpatient pharmacy? No    Assessment: 1. Chronic systolic CHF (EF 62-83%). NYHA  class II/III symptoms. Trace edema noted on MD exam.  - Continue furosemide 60 mg PO BID - Restart metoprolol succinate now off IV diuretics - Consider low dose ACE/ARB - limited in the past due to orthostatic hypotension - Consider adding spironolactone  - Feraheme given on 5/8 and 5/12   Plan: 1) Medication changes recommended at this time: - Resume metoprolol succinate at discharge - Consider low dose ACE/ARB +/- spironolactone at hospital follow up appointment  2) Patient assistance application(s): - None at this time  3) Education  - Patient has been educated on current HF medications: furosemide + anticipated medications at discharge or TOC follow up - ACEI/ARB/ARNI, spironolactone, BB, SGLT2i -Patient verbalizes understanding that over the next few months, these medication doses may change and more medications may be added to optimize HF regimen -Patient has been educated on basic disease state pathophysiology and goals of therapy -Time spent (39min)  Vertis Kelch, PharmD, BCPS Heart Failure Stewardship Pharmacist Phone [phone] 11/25/2019       9:04 AM

## 2019-11-25 NOTE — TOC Transition Note (Signed)
Transition of Care [facility]) - CM/SW Discharge Note Marvetta Gibbons RN, BSN Transitions of Care Unit 4E- RN Case Manager [phone] Cross Coverage for [place]   Patient Details  Name: [name] MRN: [id number] Date of Birth: 04-29-1946  Transition of Care [facility]) CM/SW Contact:  Dawayne Patricia, RN Phone Number: 11/25/2019, 12:30 PM   Clinical Narrative:    Pt stable for transition home today, f/u done with pt regarding [place] and DME needs, per pt he is now agreeable to [place] (pt states he only wants HHRN- he does not want HH therapies- declines HHPT/OT)- list provided to pt for [facility] choice Per CMS guidelines from medicare.gov website with star ratings (copy placed in shadow chart)- pt voices he wants to use [facility] for [facility] nursing needs. He uses [employer] for his home 02 needs. Pt declines needing BSC.  Pt states he will have transportation home.  Call made to [facility] with [facility] for [facility] referral- referral has been accepted.  Call also made to [facility] with [facility] for home 02 needs- to be sure they have updated home 02 order in system.    Final next level of care: [place] Barriers to Discharge: No Barriers Identified   Patient Goals and CMS Choice Patient states their goals for this hospitalization and ongoing recovery are:: to go home CMS Medicare.gov Compare Post Acute Care list provided to:: Patient Choice offered to / list presented to : Patient  Discharge Placement               Home with [facility]        Discharge Plan and Services   Discharge Planning Services: CM Consult Post Acute Care Choice: Home Health, Durable Medical Equipment          DME Arranged: Bedside commode DME Agency: (pt declined DME)       HH Arranged: RN, PT, OT HH Agency: [place] ([facility]) Date HH Agency Contacted: 11/25/19 Time [place]: 1165 Representative spoke with at [place]: [place] ([place]) Interventions     Readmission  Risk Interventions Readmission Risk Prevention Plan 11/25/2019 11/19/2019  Transportation Screening Complete Complete  PCP or Specialist Appt within 3-5 Days Complete Complete  HRI or [place] Complete Complete  Social Work Consult for [place] Planning/Counseling Complete Complete  Palliative Care Screening Complete Complete  Medication Review [employer] photographer) Complete Complete  Some recent data might be hidden

## 2019-11-25 NOTE — TOC Transition Note (Signed)
Transition of Care [facility]) - CM/SW Discharge Note   Patient Details  Name: DOIS JUARBE MRN: [id number] Date of Birth: August 14, 1945  Transition of Care [facility]) CM/SW Contact:  Alberteen Sam, LCSW Phone Number: 11/25/2019, 8:42 AM   Clinical Narrative:     Patient to dc home today. Patient as declined all home health services, DME, and home palliative/hospice services at this time. Patient reports having transportation home.   CSW signing off.   Final next level of care: Home/Self Care Barriers to Discharge: No Barriers Identified   Patient Goals and CMS Choice Patient states their goals for this hospitalization and ongoing recovery are:: to go home CMS Medicare.gov Compare Post Acute Care list provided to:: Patient Choice offered to / list presented to : Patient  Discharge Placement                       Discharge Plan and Services   Discharge Planning Services: CM Consult              DME Agency: NA       HH Arranged: PT, Refused HH          Social Determinants of Health (SDOH) Interventions     Readmission Risk Interventions Readmission Risk Prevention Plan 11/19/2019  Transportation Screening Complete  PCP or Specialist Appt within 3-5 Days Complete  HRI or [place] Complete  Social Work Consult for [place] Planning/Counseling Complete  Palliative Care Screening Complete  Medication Review Press photographer) Complete  Some recent data might be hidden

## 2019-11-25 NOTE — Progress Notes (Signed)
Cardiology progress Note  Cardiologist:   Candee Furbish, MD (former Dr. Ron Parker)  Subjective   Doing well today, excited to go home today. SOB has improved.  Inpatient Medications    Scheduled Meds: . acetaminophen  1,000 mg Oral Q8H  . alum & mag hydroxide-simeth  30 mL Oral BID  . aspirin EC  81 mg Oral Daily  . atorvastatin  10 mg Oral Daily  . bethanechol  10 mg Oral TID  . furosemide  60 mg Oral BID  . insulin aspart  0-5 Units Subcutaneous QHS  . insulin aspart  0-9 Units Subcutaneous TID WC  . insulin glargine  5 Units Subcutaneous Daily  . lidocaine  1 application Urethral Once  . mouth rinse  15 mL Mouth Rinse BID  . mometasone-formoterol  2 puff Inhalation BID  . tamsulosin  0.4 mg Oral QPC supper  . vitamin B-12  1,000 mcg Oral Daily   Continuous Infusions:  PRN Meds: albuterol, nitroGLYCERIN, senna-docusate, traMADol   Vital Signs    Vitals:   11/24/19 1957 11/24/19 2036 11/25/19 0441 11/25/19 0446  BP:  (!) 101/59 103/64   Pulse:  74 73   Resp:  17 18   Temp:  98.1 F (36.7 C) 98 F (36.7 C)   TempSrc:  Oral Oral   SpO2: 90% 93% 92%   Weight:    72 kg  Height:        Intake/Output Summary (Last 24 hours) at 11/25/2019 0934 Last data filed at 11/25/2019 2671 Gross per 24 hour  Intake 840 ml  Output 1825 ml  Net -985 ml   Filed Weights   11/23/19 0534 11/24/19 0458 11/25/19 0446  Weight: 71.3 kg 71.4 kg 72 kg    Telemetry    Afib, V paced - Personally Reviewed  Physical Exam   GEN- The patient is elderly and frail appearing, alert and oriented x 3 today.   Head- normocephalic, atraumatic Eyes-  Sclera clear, conjunctiva pink Ears- hearing intact Oropharynx- clear Neck- supple, Lungs-  normal work of breathing Heart- Regular rate and rhythm  (paced) GI- soft  Extremities- no clubbing, cyanosis, trace edema  MS-diffuse atrophy Skin- no rash or lesion Psych- euthymic mood, full affect Neuro- strength and sensation are  intact   Labs    Chemistry Recent Labs  Lab 11/20/19 0820 11/20/19 0820 11/21/19 0735 11/21/19 0735 11/22/19 2458 11/22/19 0998 11/23/19 3382 11/24/19 0324 11/25/19 0449  NA 133*   < > 138   < > 137   < > 136 136 138  K 3.3*   < > 3.8   < > 3.6   < > 4.1 3.7 4.0  CL 96*   < > 100   < > 102   < > 99 99 100  CO2 27   < > 28   < > 27   < > 30 28 30   GLUCOSE 212*   < > 153*   < > 118*   < > 111* 126* 136*  BUN 17   < > 17   < > 16   < > 14 16 17   CREATININE 0.99   < > 1.09   < > 0.86   < > 0.83 0.83 0.99  CALCIUM 8.2*   < > 8.7*   < > 8.4*   < > 8.4* 8.5* 8.4*  PROT 5.6*  --  6.2*  --   --   --   --   --   --  ALBUMIN 2.7*   < > 3.0*   < > 2.5*   < > 2.6* 2.8* 2.6*  AST 12*  --  14*  --   --   --   --   --   --   ALT 15  --  16  --   --   --   --   --   --   ALKPHOS 64  --  66  --   --   --   --   --   --   BILITOT 1.7*  --  2.1*  --  1.5*  --   --   --   --   GFRNONAA >60   < > >60   < > >60   < > >60 >60 >60  GFRAA >60   < > >60   < > >60   < > >60 >60 >60  ANIONGAP 10   < > 10   < > 8   < > 7 9 8    < > = values in this interval not displayed.     Hematology Recent Labs  Lab 11/19/19 0507 11/19/19 0507 11/20/19 0820 11/23/19 0633 11/25/19 0449  WBC 6.8  --  7.2 5.9  --   RBC 4.06*  --  4.06* 4.06*  --   HGB 8.6*   < > 8.5* 8.7* 9.5*  HCT 30.7*   < > 31.2* 32.3* 33.8*  MCV 75.6*  --  76.8* 79.6*  --   MCH 21.2*  --  20.9* 21.4*  --   MCHC 28.0*  --  27.2* 26.9*  --   RDW 19.7*  --  21.0* 24.0*  --   PLT 184  --  198 222  --    < > = values in this interval not displayed.     Patient ID  74 yo male admitted with acute on chronic hypoxic respiratory failure with severe COPD on home O2.  He also has acute on chronic systolic dysfunction, permanent afib s/p AV nodal ablation, and h/o MVR for mitral valve endocarditis.  Assessment & Plan    1.  Acute on chronic hypoxic respiratory failure Chronically on 4L. Continues to have high O2 requirement but feels  improved from yesterday.  He has advanced COPD as well as pulmonary hypertension.  Palliative consultation was performed yesterday however the patient is not interested.    2. Acute on chronic systolic and diastolic dysfunction with biventricular failure He diuresed 1 L overnight, he feels that his breathing has improved, discharged home on Lasix 60 mg p.o. twice daily, previously 40 mg daily.  We will arrange for follow-up in our clinic.  3. Permanent afib Not on anticoagulation due to prior ICH S/p AV nodal ablation  4. Debility/ deconditioning Wife worries about his frequent falls and her ability to care for him. He is not willing to consider SNF.  Could consider home hospice as an option  Prognosis is poor He is DNI/DNR Palliative care has been consulted  Thompson Grayer MD, [facility] 11/25/2019 9:34 AM

## 2019-11-25 NOTE — Progress Notes (Signed)
Occupational Therapy Treatment Patient Details Name: RANA ADORNO MRN: [id number] DOB: Feb 24, 1946 Today's Date: 11/25/2019    History of present illness SCOTTY WEIGELT is a 74 y.o. male with PMH of chronic hypoxemic respiratory failure on 4 L of home oxygen, systolic heart failure, CVA, ICH, HTN, HLD, T2 DM, lung cancer stage Ia, thoracic aortic aneurysm, permanent atrial fibrillation s/p ICD, who presents 11/13/19 with worsening SOB and edema. Workup for CHF exacerbation.   OT comments  Pt making steady progress towards OT goals this session. Session focus on household distance functional mobility with no AD and ECS related to BADL participation. Overall, pt requires gross supervision for room level functional mobility. Pt on 5L O2 throughout session with O2 WNL. Continued education on ECS related to BADL participation. Pt perseverating on when he can return home during session. DC plan remains appropriate although pt will likely refuse services. Will continue to follow acutely per POC.   Follow Up Recommendations  Home health OT;Supervision/Assistance - 24 hour;Other (comment)(will likely refuse services)    Equipment Recommendations  3 in 1 bedside commode    Recommendations for Other Services      Precautions / Restrictions Precautions Precautions: Fall;Other (comment) Precaution Comments: Watch SpO2 and BP Restrictions Weight Bearing Restrictions: No       Mobility Bed Mobility Overal bed mobility: Modified Independent Bed Mobility: Supine to Sit;Sit to Supine     Supine to sit: Modified independent (Device/Increase time) Sit to supine: Modified independent (Device/Increase time)      Transfers Overall transfer level: Needs assistance Equipment used: None Transfers: Sit to/from Stand Sit to Stand: Supervision         General transfer comment: gross supervision; no AD    Balance Overall balance assessment: Needs assistance Sitting-balance support: No upper  extremity supported;Feet supported;Feet unsupported Sitting balance-Leahy Scale: Good     Standing balance support: No upper extremity supported;During functional activity Standing balance-Leahy Scale: Fair                             ADL either performed or assessed with clinical judgement   ADL Overall ADL's : Needs assistance/impaired             Lower Body Bathing: Min guard;Cueing for safety;Cueing for sequencing;Sit to/from stand Lower Body Bathing Details (indicate cue type and reason): simulated from EOB     Lower Body Dressing: Supervision/safety;Sitting/lateral leans Lower Body Dressing Details (indicate cue type and reason): simulated EOB while pulling up socks Toilet Transfer: Supervision/safety;Ambulation Toilet Transfer Details (indicate cue type and reason): simulated via functional mobility with no AD and gross supervision overall       Tub/Shower Transfer Details (indicate cue type and reason): reprots tub shower with seat Functional mobility during ADLs: Supervision/safety General ADL Comments: session focus on household distance functional mobility with no AD and LB dressing tasks EOB. Pt requires gross supervision overall. issued pt ECS handout to increase carryover for ECS related to BADLs     Vision       Perception     Praxis      Cognition Arousal/Alertness: Awake/alert Behavior During Therapy: WFL for tasks assessed/performed Overall Cognitive Status: Within Functional Limits for tasks assessed                                 General Comments: pt perseverating on waiting for MD so he  can go home        Exercises     Shoulder Instructions       General Comments pt on 5L O2 throughout session with O2 >94% during session; issed pt ECS handout and provided education    Pertinent Vitals/ Pain       Pain Assessment: No/denies pain  Home Living                                          Prior  Functioning/Environment              Frequency  Min 2X/week        Progress Toward Goals  OT Goals(current goals can now be found in the care plan section)  Progress towards OT goals: Progressing toward goals  Acute Rehab OT Goals Patient Stated Goal: return home OT Goal Formulation: With patient Time For Goal Achievement: 11/29/19 Potential to Achieve Goals: Good  Plan Discharge plan remains appropriate    Co-evaluation                 AM-PAC OT "6 Clicks" Daily Activity     Outcome Measure   Help from another person eating meals?: None Help from another person taking care of personal grooming?: A Little Help from another person toileting, which includes using toliet, bedpan, or urinal?: A Little Help from another person bathing (including washing, rinsing, drying)?: A Little Help from another person to put on and taking off regular upper body clothing?: None Help from another person to put on and taking off regular lower body clothing?: A Little 6 Click Score: 20    End of Session Equipment Utilized During Treatment: Oxygen;Other (comment)(5L O2)  OT Visit Diagnosis: Unsteadiness on feet (R26.81);Other abnormalities of gait and mobility (R26.89);Muscle weakness (generalized) (M62.81)   Activity Tolerance Patient tolerated treatment well   Patient Left in bed;with call bell/phone within reach(sitting EOB)   Nurse Communication          Time: 7116-5790 OT Time Calculation (min): 8 min  Charges: OT General Charges $OT Visit: 1 Visit OT Treatments $Self Care/Home Management : 8-22 mins Lanier Clam., COTA/L Acute Rehabilitation Services [phone] [place] 11/25/2019, 8:45 AM

## 2019-11-26 ENCOUNTER — Telehealth: Payer: Self-pay | Admitting: *Deleted

## 2019-11-26 ENCOUNTER — Telehealth: Payer: Self-pay | Admitting: Cardiology

## 2019-11-26 MED ORDER — METOPROLOL SUCCINATE ER 25 MG PO TB24: 12.5000 mg | ORAL_TABLET | Freq: Every day | ORAL | 3 refills | Status: DC

## 2019-11-26 NOTE — Telephone Encounter (Signed)
Follow up   Patient's wife states that she is having difficulty in cutting metoprolol succinate (TOPROL-XL) 25 MG 24 hr tablet in half. Please call to discuss.

## 2019-11-26 NOTE — Telephone Encounter (Signed)
Returned call to patient's wife and advised that our records indicate that patient should be taking metoprolol succinate 12.5 mg daily. Wife states patient has not taken the medication in several months and she has just discovered this. I advised that I will send a new Rx to patient's pharmacy and for patient to resume taking. Patient's wife verbalized understanding and thanked me for returning the call.

## 2019-11-26 NOTE — Telephone Encounter (Signed)
Furosemide increased to 60 mg.  Do you want to order three 20 mg tabs or 1.5 of the 40 mg tabs?

## 2019-11-26 NOTE — Telephone Encounter (Signed)
Wife, [name], is returning your call from earlier today. [name], can be reached at [phone]. Thanks

## 2019-11-26 NOTE — Telephone Encounter (Signed)
Returned call to [name]. She states that she contacted the pharmacy and that they told her that they didn't want to cut the pill in 1/2.  She stated that she has tried before and it turned to powder. Advised her to get a pill cutter from the pharmacy and try it again.. If it turned to powder, to call back and notify us. She verbalized understanding.

## 2019-11-26 NOTE — Telephone Encounter (Signed)
Spoke to [name] and advised that the hospital sent in refills of the Lasix.  Additional refills are not needed at this time.  Also advised her to call cardiology to inquire about the metoprolol.  Nothing further needed.

## 2019-11-26 NOTE — Telephone Encounter (Signed)
Transition Care Management Follow-up Telephone Call   Date discharged? 11/25/2019   How have you been since you were released from the hospital? Per wife " he has been eating good"    Do you understand why you were in the hospital? yes   Do you understand the discharge instructions? yes   Where were you discharged to? Home    Items Reviewed:  Medications reviewed: yes  Allergies reviewed: yes  Dietary changes reviewed: yes  Referrals reviewed: yes   Functional Questionnaire:   Activities of Daily Living (ADLs):   states they are independent in the following: ambulation, feeding, continence, grooming, toileting and dressing States they require assistance with the following: bathing and hygiene   Any transportation issues/concerns?: no   Any patient concerns? Yes. Concerned about going over med list and pharmacy they do not have Toprol XL on list. Do you want to restart patinet on this medication it was rx'd by Dr.Regalado. Also wants to know if you keep him on the other medications he was sent home on ( Tylenol, Bethanechol, Furosemide (increased dose), Tamsulosin, and Vitamin B-12?    Confirmed importance and date/time of follow-up visits scheduled yes  Provider Appointment booked with Cry Nafziger, NP  12/03/2019 at [facility]  Confirmed with patient if condition begins to worsen call PCP or go to the ER.  Patient was given the office number and encouraged to call back with question or concerns.  : yes

## 2019-11-26 NOTE — Telephone Encounter (Signed)
Pt c/o medication issue:  1. Name of Medication: metoprolol succinate (TOPROL-XL) 25 MG 24 hr tablet  2. How are you currently taking this medication (dosage and times per day)? N/a   3. Are you having a reaction (difficulty breathing--STAT)? no  4. What is your medication issue? Patients wife wants to make sure that her husband is not supposed to be taking this medication. She states her husband was recently discharged from the hospital and this medication was listed on his discharge papers. She states she called his PCP and they told him it was prescribed by Dr. Tyrell Antonio but that it was heart related. She is unsure of who Dr. Tyrell Antonio is but beings that it is heart related she called our office to ask. She states her husband is not taking this medication.

## 2019-11-26 NOTE — Telephone Encounter (Signed)
His metoprolol is prescribed by cardiology, with the increase in Lasix I would follow-up with cardiology to make sure that they still want to take this medication.  We can send in 3 tabs of 20 mg tabs of Lasix

## 2019-11-26 NOTE — Telephone Encounter (Signed)
Attempted to contact patient. No answer. LVM for patient to return call.  

## 2019-11-27 ENCOUNTER — Ambulatory Visit (INDEPENDENT_AMBULATORY_CARE_PROVIDER_SITE_OTHER): Payer: Medicare Other | Admitting: *Deleted

## 2019-11-27 DIAGNOSIS — Z8673 Personal history of transient ischemic attack (TIA), and cerebral infarction without residual deficits: Secondary | ICD-10-CM | POA: Diagnosis not present

## 2019-11-27 DIAGNOSIS — I701 Atherosclerosis of renal artery: Secondary | ICD-10-CM | POA: Diagnosis not present

## 2019-11-27 DIAGNOSIS — Z950 Presence of cardiac pacemaker: Secondary | ICD-10-CM | POA: Diagnosis not present

## 2019-11-27 DIAGNOSIS — I110 Hypertensive heart disease with heart failure: Secondary | ICD-10-CM | POA: Diagnosis not present

## 2019-11-27 DIAGNOSIS — N400 Enlarged prostate without lower urinary tract symptoms: Secondary | ICD-10-CM | POA: Diagnosis not present

## 2019-11-27 DIAGNOSIS — I428 Other cardiomyopathies: Secondary | ICD-10-CM

## 2019-11-27 DIAGNOSIS — E1165 Type 2 diabetes mellitus with hyperglycemia: Secondary | ICD-10-CM | POA: Diagnosis not present

## 2019-11-27 DIAGNOSIS — Z9049 Acquired absence of other specified parts of digestive tract: Secondary | ICD-10-CM | POA: Diagnosis not present

## 2019-11-27 DIAGNOSIS — Z87891 Personal history of nicotine dependence: Secondary | ICD-10-CM | POA: Diagnosis not present

## 2019-11-27 DIAGNOSIS — I712 Thoracic aortic aneurysm, without rupture: Secondary | ICD-10-CM | POA: Diagnosis not present

## 2019-11-27 DIAGNOSIS — I429 Cardiomyopathy, unspecified: Secondary | ICD-10-CM | POA: Diagnosis not present

## 2019-11-27 DIAGNOSIS — D126 Benign neoplasm of colon, unspecified: Secondary | ICD-10-CM | POA: Diagnosis not present

## 2019-11-27 DIAGNOSIS — Z7984 Long term (current) use of oral hypoglycemic drugs: Secondary | ICD-10-CM | POA: Diagnosis not present

## 2019-11-27 DIAGNOSIS — I5032 Chronic diastolic (congestive) heart failure: Secondary | ICD-10-CM | POA: Diagnosis not present

## 2019-11-27 DIAGNOSIS — Z952 Presence of prosthetic heart valve: Secondary | ICD-10-CM | POA: Diagnosis not present

## 2019-11-27 DIAGNOSIS — E1169 Type 2 diabetes mellitus with other specified complication: Secondary | ICD-10-CM | POA: Diagnosis not present

## 2019-11-27 DIAGNOSIS — I252 Old myocardial infarction: Secondary | ICD-10-CM | POA: Diagnosis not present

## 2019-11-27 DIAGNOSIS — Z95810 Presence of automatic (implantable) cardiac defibrillator: Secondary | ICD-10-CM | POA: Diagnosis not present

## 2019-11-27 DIAGNOSIS — I272 Pulmonary hypertension, unspecified: Secondary | ICD-10-CM | POA: Diagnosis not present

## 2019-11-27 DIAGNOSIS — D509 Iron deficiency anemia, unspecified: Secondary | ICD-10-CM | POA: Diagnosis not present

## 2019-11-27 DIAGNOSIS — I4821 Permanent atrial fibrillation: Secondary | ICD-10-CM | POA: Diagnosis not present

## 2019-11-27 DIAGNOSIS — E785 Hyperlipidemia, unspecified: Secondary | ICD-10-CM | POA: Diagnosis not present

## 2019-11-27 DIAGNOSIS — I5043 Acute on chronic combined systolic (congestive) and diastolic (congestive) heart failure: Secondary | ICD-10-CM

## 2019-11-27 DIAGNOSIS — J449 Chronic obstructive pulmonary disease, unspecified: Secondary | ICD-10-CM | POA: Diagnosis not present

## 2019-11-27 DIAGNOSIS — Z8701 Personal history of pneumonia (recurrent): Secondary | ICD-10-CM | POA: Diagnosis not present

## 2019-11-27 DIAGNOSIS — J9621 Acute and chronic respiratory failure with hypoxia: Secondary | ICD-10-CM | POA: Diagnosis not present

## 2019-11-27 DIAGNOSIS — I5023 Acute on chronic systolic (congestive) heart failure: Secondary | ICD-10-CM | POA: Diagnosis not present

## 2019-11-27 LAB — CUP PACEART REMOTE DEVICE CHECK
Battery Remaining Longevity: 16 mo
Battery Remaining Percentage: 22 %
Battery Voltage: 2.78 V
Date Time Interrogation Session: 20210519040017
HighPow Impedance: 48 Ohm
HighPow Impedance: 48 Ohm
Implantable Lead Implant Date: 20100113
Implantable Lead Implant Date: 20100113
Implantable Lead Location: 753858
Implantable Lead Location: 753860
Implantable Lead Model: 7122
Implantable Pulse Generator Implant Date: 20130729
Lead Channel Impedance Value: 340 Ohm
Lead Channel Impedance Value: 410 Ohm
Lead Channel Pacing Threshold Amplitude: 0.75 V
Lead Channel Pacing Threshold Amplitude: 0.75 V
Lead Channel Pacing Threshold Pulse Width: 0.5 ms
Lead Channel Pacing Threshold Pulse Width: 0.5 ms
Lead Channel Sensing Intrinsic Amplitude: 7.4 mV
Lead Channel Setting Pacing Amplitude: 2 V
Lead Channel Setting Pacing Amplitude: 2 V
Lead Channel Setting Pacing Pulse Width: 0.5 ms
Lead Channel Setting Pacing Pulse Width: 0.5 ms
Lead Channel Setting Sensing Sensitivity: 0.5 mV
Pulse Gen Serial Number: 7053988

## 2019-11-28 ENCOUNTER — Other Ambulatory Visit: Payer: Self-pay | Admitting: Thoracic Surgery (Cardiothoracic Vascular Surgery)

## 2019-11-28 ENCOUNTER — Telehealth: Payer: Self-pay | Admitting: Adult Health

## 2019-11-28 ENCOUNTER — Other Ambulatory Visit: Payer: Self-pay | Admitting: Pulmonary Disease

## 2019-11-28 DIAGNOSIS — I4891 Unspecified atrial fibrillation: Secondary | ICD-10-CM

## 2019-11-28 NOTE — Progress Notes (Unsigned)
ct 

## 2019-11-28 NOTE — Progress Notes (Signed)
Remote ICD transmission.   

## 2019-11-28 NOTE — Telephone Encounter (Signed)
Donita  Odom with [facility] is calling needing verbal orders for skilled nursing 2x's a week for 9 weeks.

## 2019-11-28 NOTE — Telephone Encounter (Signed)
Donita advised to proceed with orders. Will forward to [facility] as [place].

## 2019-11-29 ENCOUNTER — Telehealth: Payer: Self-pay | Admitting: Cardiology

## 2019-11-29 DIAGNOSIS — I110 Hypertensive heart disease with heart failure: Secondary | ICD-10-CM | POA: Diagnosis not present

## 2019-11-29 DIAGNOSIS — I252 Old myocardial infarction: Secondary | ICD-10-CM | POA: Diagnosis not present

## 2019-11-29 DIAGNOSIS — I5032 Chronic diastolic (congestive) heart failure: Secondary | ICD-10-CM | POA: Diagnosis not present

## 2019-11-29 DIAGNOSIS — J449 Chronic obstructive pulmonary disease, unspecified: Secondary | ICD-10-CM | POA: Diagnosis not present

## 2019-11-29 DIAGNOSIS — I5023 Acute on chronic systolic (congestive) heart failure: Secondary | ICD-10-CM | POA: Diagnosis not present

## 2019-11-29 DIAGNOSIS — J9621 Acute and chronic respiratory failure with hypoxia: Secondary | ICD-10-CM | POA: Diagnosis not present

## 2019-11-29 NOTE — Telephone Encounter (Signed)
° °  Pt's wife calling, she said they just got home from hospital and pt been very weak, when he stand up he is very shaky and couldn't hold his O2 tank, she said it's going to be hard for her to take him on his appt on 06/02 and wondering if Dr. Marlou Porch recommend transportation service to help pt or if appt can be virtual  Please advise

## 2019-11-29 NOTE — Telephone Encounter (Signed)
Wife aware nurse Pam will follow up with them next week about their request. She appreciates me letting her know.

## 2019-12-01 DIAGNOSIS — I110 Hypertensive heart disease with heart failure: Secondary | ICD-10-CM | POA: Diagnosis not present

## 2019-12-01 DIAGNOSIS — I5023 Acute on chronic systolic (congestive) heart failure: Secondary | ICD-10-CM | POA: Diagnosis not present

## 2019-12-01 DIAGNOSIS — J449 Chronic obstructive pulmonary disease, unspecified: Secondary | ICD-10-CM | POA: Diagnosis not present

## 2019-12-01 DIAGNOSIS — I5032 Chronic diastolic (congestive) heart failure: Secondary | ICD-10-CM | POA: Diagnosis not present

## 2019-12-01 DIAGNOSIS — J9621 Acute and chronic respiratory failure with hypoxia: Secondary | ICD-10-CM | POA: Diagnosis not present

## 2019-12-01 DIAGNOSIS — I252 Old myocardial infarction: Secondary | ICD-10-CM | POA: Diagnosis not present

## 2019-12-02 ENCOUNTER — Telehealth: Payer: Self-pay | Admitting: Adult Health

## 2019-12-02 NOTE — Telephone Encounter (Signed)
Pt's spouse, Labron Bloodgood, stated that the pt was discharge from [facility] hospital a week ago from today, and the pharmacy sent him home with a prescription Bethanechol. When she called the pharmacy to see if it refillable and was informed to contact his PCP for refills.   Medication Refill:  Bethanechol 10 mg tablets  Pharmacy: [facility] [place] [place]: [phone]

## 2019-12-03 ENCOUNTER — Other Ambulatory Visit: Payer: Self-pay

## 2019-12-03 ENCOUNTER — Encounter: Payer: Self-pay | Admitting: Adult Health

## 2019-12-03 ENCOUNTER — Ambulatory Visit (INDEPENDENT_AMBULATORY_CARE_PROVIDER_SITE_OTHER): Payer: Medicare Other | Admitting: Adult Health

## 2019-12-03 ENCOUNTER — Telehealth: Payer: Self-pay | Admitting: Adult Health

## 2019-12-03 VITALS — BP 110/80 | Wt 157.0 lb

## 2019-12-03 DIAGNOSIS — R339 Retention of urine, unspecified: Secondary | ICD-10-CM

## 2019-12-03 DIAGNOSIS — J9621 Acute and chronic respiratory failure with hypoxia: Secondary | ICD-10-CM | POA: Diagnosis not present

## 2019-12-03 DIAGNOSIS — I4821 Permanent atrial fibrillation: Secondary | ICD-10-CM

## 2019-12-03 DIAGNOSIS — J441 Chronic obstructive pulmonary disease with (acute) exacerbation: Secondary | ICD-10-CM | POA: Diagnosis not present

## 2019-12-03 DIAGNOSIS — I5023 Acute on chronic systolic (congestive) heart failure: Secondary | ICD-10-CM | POA: Diagnosis not present

## 2019-12-03 DIAGNOSIS — J449 Chronic obstructive pulmonary disease, unspecified: Secondary | ICD-10-CM | POA: Diagnosis not present

## 2019-12-03 DIAGNOSIS — I5022 Chronic systolic (congestive) heart failure: Secondary | ICD-10-CM

## 2019-12-03 DIAGNOSIS — I252 Old myocardial infarction: Secondary | ICD-10-CM | POA: Diagnosis not present

## 2019-12-03 DIAGNOSIS — I5032 Chronic diastolic (congestive) heart failure: Secondary | ICD-10-CM | POA: Diagnosis not present

## 2019-12-03 DIAGNOSIS — E119 Type 2 diabetes mellitus without complications: Secondary | ICD-10-CM | POA: Diagnosis not present

## 2019-12-03 DIAGNOSIS — I110 Hypertensive heart disease with heart failure: Secondary | ICD-10-CM | POA: Diagnosis not present

## 2019-12-03 MED ORDER — AMOXICILLIN 500 MG PO CAPS
2000.0000 mg | ORAL_CAPSULE | Freq: Once | ORAL | 0 refills | Status: AC
Start: 2019-12-03 — End: 2019-12-03

## 2019-12-03 MED ORDER — BETHANECHOL CHLORIDE 10 MG PO TABS: 10.0000 mg | ORAL_TABLET | Freq: Three times a day (TID) | ORAL | 0 refills | Status: AC

## 2019-12-03 MED ORDER — TAMSULOSIN HCL 0.4 MG PO CAPS: 0.4000 mg | ORAL_CAPSULE | Freq: Every day | ORAL | 1 refills | Status: AC

## 2019-12-03 MED ORDER — FUROSEMIDE 20 MG PO TABS: 60.0000 mg | ORAL_TABLET | Freq: Two times a day (BID) | ORAL | 1 refills | Status: DC

## 2019-12-03 NOTE — Telephone Encounter (Signed)
Spoke to [place] and advised to proceed with orders.  Nothing further needed.

## 2019-12-03 NOTE — Telephone Encounter (Signed)
Verbal orders for plan of care  2 x week for 3 weeks  1 x week for 6 weeks  His BP was really low when she was at the patients house 95/60  nurse is going out twice a week to monitor him  [place] [phone]

## 2019-12-03 NOTE — Telephone Encounter (Signed)
Wife is unable to get pt into the building in pt's current state of physical health.  She requests his appt be changed to a telephone visit.  She reports having to do this now with most all his DR.  appt changed and phone # verified.   Patient Consent for Virtual Visit         [name] has provided verbal consent on 12/03/2019 for a virtual visit (video or telephone).   CONSENT FOR VIRTUAL VISIT FOR:  [name]  By participating in this virtual visit I agree to the following:  I hereby voluntarily request, consent and authorize [place] and its employed or contracted physicians, physician assistants, nurse practitioners or other licensed health care professionals (the Practitioner), to provide me with telemedicine health care services (the "Services") as deemed necessary by the treating Practitioner. I acknowledge and consent to receive the Services by the Practitioner via telemedicine. I understand that the telemedicine visit will involve communicating with the Practitioner through live audiovisual communication technology and the disclosure of certain medical information by electronic transmission. I acknowledge that I have been given the opportunity to request an in-person assessment or other available alternative prior to the telemedicine visit and am voluntarily participating in the telemedicine visit.  I understand that I have the right to withhold or withdraw my consent to the use of telemedicine in the course of my care at any time, without affecting my right to future care or treatment, and that the Practitioner or I may terminate the telemedicine visit at any time. I understand that I have the right to inspect all information obtained and/or recorded in the course of the telemedicine visit and may receive copies of available information for a reasonable fee.  I understand that some of the potential risks of receiving the Services via telemedicine include:  [place] Kitchen Delay or  interruption in medical evaluation due to technological equipment failure or disruption; . Information transmitted may not be sufficient (e.g. poor resolution of images) to allow for appropriate medical decision making by the Practitioner; and/or  . In rare instances, security protocols could fail, causing a breach of personal health information.  Furthermore, I acknowledge that it is my responsibility to provide information about my medical history, conditions and care that is complete and accurate to the best of my ability. I acknowledge that Practitioner's advice, recommendations, and/or decision may be based on factors not within their control, such as incomplete or inaccurate data provided by me or distortions of diagnostic images or specimens that may result from electronic transmissions. I understand that the practice of medicine is not an exact science and that Practitioner makes no warranties or guarantees regarding treatment outcomes. I acknowledge that a copy of this consent can be made available to me via my patient portal ([facility]), or I can request a printed copy by calling the office of [name].    I understand that my insurance will be billed for this visit.   I have read or had this consent read to me. . I understand the contents of this consent, which adequately explains the benefits and risks of the Services being provided via telemedicine.  . I have been provided ample opportunity to ask questions regarding this consent and the Services and have had my questions answered to my satisfaction. . I give my informed consent for the services to be provided through the use of telemedicine in my medical care

## 2019-12-03 NOTE — Progress Notes (Signed)
Virtual Visit via Telephone Note  I connected with [name] on 12/03/19 at  2:00 PM EDT by telephone and verified that I am speaking with the correct person using two identifiers.   I discussed the limitations, risks, security and privacy concerns of performing an evaluation and management service by telephone and the availability of in person appointments. I also discussed with the patient that there may be a patient responsible charge related to this service. The patient expressed understanding and agreed to proceed.  Location patient: home Location provider: work or home office Participants present for the call: patient, provider Patient did not have a visit in the prior 7 days to address this/these issue(s).   History of Present Illness: 74 year old male who  has a past medical history of Atrial septal defect, Automatic implantable cardioverter-defibrillator in situ, Chronic combined systolic and diastolic CHF (congestive heart failure) ([place]), Colon polyps, COPD (chronic obstructive pulmonary disease) ([place]), CVA (cerebral vascular accident) ([place]) (2009), Diabetes mellitus without complication ([place]), Dyslipidemia, Endocarditis, Hypertension, Intracranial hemorrhage ([place]), Lung cancer ([place]) (11/29/2013), Myocardial infarction ([place]) (2010), Pacemaker, Permanent atrial fibrillation, Pneumonia (07/2018), Pulmonary hypertension ([place]), Renal artery stenosis (HCC), 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   Hospital follow-up  Admit Date: 11/13/2019 Discharge Date: 11/25/2019  He presented to the emergency room via EMS for complaints of shortness of breath and was found to have acute on chronic systolic CHF.  He was started on IV Lasix.  Despite excellent diuresis, he continued to require higher than baseline oxygen, 5 L in which she was on 4 L at  baseline mainly due to his underlying chronic disease/COPD.  His family raise concern during this hospitalization about the ability to care for the patient at home due to frequent falls.  Patient was unwilling though to consider skilled nursing facility.  Palliative care was consulted given his poor prognosis and again patient was not interested in palliative care or hospice.  Hospital Course  1.  Acute on chronic hypoxic respiratory failure -Multifactorial including CHF exacerbation, pulmonary hypertension, and underlying COPD.  Was discharged home on 5 L of oxygen therapy.  2.  Acute on chronic systolic CHF/N ICM status post BiV-ICD/pulmonary hypertension -Echo showed an EF of 25 to 30%, diffuse hypokinesis, severe LAE and RAE, mildly reduced RV SF, and severely enlarged RV. -Good UOP on p.o. Lasix 60 mg twice daily.  He had 2.3 L output per 24 hours none none net loss of 8 L.  His BP and creatinine stayed stable. -She was discharged on p.o. Lasix 60 mg twice a day and TED hose.  3.  Advanced COPD -Chronic.  Seems to be playing a huge role in his O2 requirement.  Advised to continue home meds  4. Permanent A. Fib. S/p AV ablation. History of bioprostetic MCR as well as LAA closure  -He is not on any anticoagulation due to history of ICH.  Not taking any rate control medication for now.  5.  Thoracic aortic aneurysm-followed by Dr. Roxy Manns.  Felt to be stable with no plans of surgical intervention given comorbidities.  Advise follow-up as outpatient  6.  Hyperbilirubinemia.  -Bili 2.1 greater than 1.5.  Indirect bilirubin was 1.2.  H&H stable  7.  Iron deficiency anemia -Baseline hemoglobin 10-11 was 9.3 on admission.  Iron sat 3% with low ferritin and elevated TIBC. -IV Feraheme on 5/8 and 5/12 -Advised recheck  a CBC at follow-up  8. uncontrolled diabetes type 2. -A1c 7.4. -Discharged on home medication  9.-history of non-small cell lung cancer. - s/p wedge resection - outpatient  follow up   10.  Concerned about acute urinary retention on bladder scan -He refused in and out cath but seem to be voiding okay.  He had no suprapubic tenderness. -Discontinued Robaxin and oxycodone and scheduled Tylenol for pain. -Continue Flomax and bethanechol was added  11.  Acute chest pain -Resolved.  Troponin x3 not consistent with ACS.  EKG is nonischemic  12, near syncope-some concern about vasovagal -Continue TED hose but were changed to thigh-high  13, debility/physical deconditioning.   -Home health PT/OT with DME ordered   Today he reports that he is " doing much better". He has home health PT/OT coming out to the house and reports that he is starting to get his energy back.  Does report that his appetite is good and has not developed fevers, chills, nausea, vomiting or lower extremity edema.  He is continuing on 5 L via nasal cannula and feels less short of breath than he did prior to admission.  His wife reports that he is getting to walk around pretty well but he has not left the house since being discharged.  He is taking his medications as directed and needs a few of them refilled.  He has no acute issues that he would like to discuss.   Observations/Objective: Patient sounds cheerful and well on the phone. I do not appreciate any SOB. Speech and thought processing are grossly intact. Patient reported vitals:  Assessment and Plan: 1. Chronic systolic CHF (congestive heart failure) (HCC) -We reviewed his hospital discharge notes, labs, and imaging.  All questions asked by himself and his wife were answered to the best of my ability.  He was advised to continue with medications upon discharge and return precautions were reviewed. - furosemide (LASIX) 20 MG tablet; Take 3 tablets (60 mg total) by mouth 2 (two) times daily.  Dispense: 180 tablet; Refill: 1  2. Permanent atrial fibrillation (HCC) - Continue metoprolol 12.5 mg daily.   3. Type 2 diabetes mellitus without  complication, without long-term current use of insulin ([place]) - Continue home medications   4. COPD with acute exacerbation ([place]) -Continue with home medication and 5 L via nasal cannula.  He did not sound short of breath on the phone today.  5. Urinary retention  - bethanechol (URECHOLINE) 10 MG tablet; Take 1 tablet (10 mg total) by mouth 3 (three) times daily.  Dispense: 30 tablet; Refill: 0 - tamsulosin (FLOMAX) 0.4 MG CAPS capsule; Take 1 capsule (0.4 mg total) by mouth daily after supper.  Dispense: 30 capsule; Refill: 1   Follow Up Instructions:  I did not refer this patient for an OV in the next 24 hours for this/these issue(s).  I discussed the assessment and treatment plan with the patient. The patient was provided an opportunity to ask questions and all were answered. The patient agreed with the plan and demonstrated an understanding of the instructions.   The patient was advised to call back or seek an in-person evaluation if the symptoms worsen or if the condition fails to improve as anticipated.  I provided 28  minutes of non-face-to-face time during this encounter.   [name], NP

## 2019-12-03 NOTE — Telephone Encounter (Signed)
[place] for verbal orders.   His BP is always low

## 2019-12-04 ENCOUNTER — Telehealth: Payer: Self-pay | Admitting: Adult Health

## 2019-12-04 ENCOUNTER — Ambulatory Visit (HOSPITAL_COMMUNITY): Admission: RE | Admit: 2019-12-04 | Payer: Medicare Other | Source: Ambulatory Visit

## 2019-12-04 DIAGNOSIS — S91209A Unspecified open wound of unspecified toe(s) with damage to nail, initial encounter: Secondary | ICD-10-CM

## 2019-12-04 NOTE — Telephone Encounter (Signed)
S91.209A

## 2019-12-04 NOTE — Telephone Encounter (Signed)
[name] with [facility] stated that pts spouse called and stated that the pt had a fall on last night and the EMS was called to help him up out of the floor and pt refused assesement by the EMS.  And pt ripped off several toenails spouse did not witness the fall and pt told her that he is fine.  Judson Roch wanted to know if the podiatrist referral was placed for patient.

## 2019-12-04 NOTE — Addendum Note (Signed)
Addended by: Miles Costain T on: 12/04/2019 04:34 PM   Modules accepted: Orders

## 2019-12-04 NOTE — Telephone Encounter (Signed)
Ok for referral?

## 2019-12-04 NOTE — Telephone Encounter (Signed)
Left a message on [name]'s confidential voicemail informing her that referral has been placed.  Asked that she call back if any questions.  Nothing further needed.  Will close encounter.

## 2019-12-05 ENCOUNTER — Other Ambulatory Visit: Payer: Federal, State, Local not specified - PPO

## 2019-12-05 ENCOUNTER — Other Ambulatory Visit: Payer: Self-pay | Admitting: Adult Health

## 2019-12-05 ENCOUNTER — Other Ambulatory Visit: Payer: Self-pay

## 2019-12-05 ENCOUNTER — Telehealth (HOSPITAL_COMMUNITY): Payer: Self-pay

## 2019-12-05 DIAGNOSIS — I5032 Chronic diastolic (congestive) heart failure: Secondary | ICD-10-CM | POA: Diagnosis not present

## 2019-12-05 DIAGNOSIS — E119 Type 2 diabetes mellitus without complications: Secondary | ICD-10-CM

## 2019-12-05 DIAGNOSIS — Z515 Encounter for palliative care: Secondary | ICD-10-CM

## 2019-12-05 DIAGNOSIS — I5023 Acute on chronic systolic (congestive) heart failure: Secondary | ICD-10-CM | POA: Diagnosis not present

## 2019-12-05 DIAGNOSIS — I110 Hypertensive heart disease with heart failure: Secondary | ICD-10-CM | POA: Diagnosis not present

## 2019-12-05 DIAGNOSIS — J449 Chronic obstructive pulmonary disease, unspecified: Secondary | ICD-10-CM | POA: Diagnosis not present

## 2019-12-05 DIAGNOSIS — J9621 Acute and chronic respiratory failure with hypoxia: Secondary | ICD-10-CM | POA: Diagnosis not present

## 2019-12-05 DIAGNOSIS — I252 Old myocardial infarction: Secondary | ICD-10-CM | POA: Diagnosis not present

## 2019-12-05 NOTE — Telephone Encounter (Signed)
Heart Failure Stewardship Pharmacist  Transitions of Care Follow-up Call   PCP: [name], NP PCP-Cardiologist: Candee Furbish, MD    HPI and Hospital Course:  74 yo M with PMH significant ofchronic hypoxemic respiratory failure on 4 L of home oxygen, systolic heart failure, history of CVA, ICH, HTN, HLD, T2 DM, lung cancer stage Ia, thoracic aortic aneurysm, permanent atrial fibrillation s/pICD, LAA closurewho presented to ED from PCP office on 11/13/19 with worsening shortness of breath and edema. ECHO on 11/14/19 showed LVEF of 25-30% with worsening diffuse hypokinesis. He received IV diuresis throughout admission and was transitioned to PO diuretics on 11/24/19. He was then discharged from [facility] on 11/25/19.   The patient's wife was able to speak with me regarding that patient's wellbeing since discharge. She says he has good days and bad. He has fallen 3 times since discharge, refuses to leave the house, and does not cooperate with physical therapy. The home health nurses are able to check vitals and report BP around 100/60s. She is unsure if he is experiencing any dizziness or lightheadedness, especially prior to falls. Denies chest pain or palpitations. She says his breathing is much better since before this last hospitalization - he is on 5L of oxygen at home and denies shortness of breath. He is able to complete ADLs but remains very inactive at home. He weighs himself at home daily and his wife says he remains stable at 156-157 lbs. He takes furosemide 60 mg BID. She says he has minimal LE edema (R>L) and the nurses have asked him to elevate his legs to help with this. Denies PND or orthopnea. His appetite has been ok but he still eats 3 meals per day. Compliant with low sodium diet and takes all medications as prescribed.    Pertinent Lab Values:  Serum creatinine 0.99, BUN 17, Potassium 4, Sodium 138, BNP 1448.6, Magnesium 2  HF Medications: Furosemide 60 mg BID KCl 40 mEq  daily Metoprolol 12.5 mg daily   Did the pt receive and understand the discharge instructions provided? yes  Have you obtained your medications from the pharmacy after your hospital discharge? yes  Are you experiencing any side effects to your medications: no  Medication Assistance / Insurance Benefits Check: Does the patient have prescription insurance?   yes Type of insurance plan: Medicare + [place]  Does the patient qualify for medication assistance through manufacturers or grants?   Pending household income information  Eligible grants and/or patient assistance programs: None with current HF regimen  Medication assistance applications in progress: none  Medication assistance applications approved: none  Approved medication assistance renewals will be completed by: Dr. Kingsley Plan office   Assessment: 1. Chronicsystolic CHF (EF 78-29%). NYHA class II/IIIsymptoms. - Continue furosemide 60 mg PO BID with KCl 40 mEq daily - Continue metoprolol succinate 12.5 mg daily - Consider adding low dose ACE/ARB in the future - limited in the past due to orthostatic hypotension - Feraheme given on 5/8 and 5/12    Plan: 1) Medication changes recommended at this time: - None - patient has had 3 falls since hospital discharge. Wife is unsure if this could be BP related or another cause. I have asked his wife to ask the patient what his symptoms are before the falls (dizziness, lightheadedness, fatigue, etc). -Provider contacted: Dr. Marlou Porch  2) Patient assistance application(s): - None  3) Education - Patient's wife has been educated on current HF medications (furosemide, metoprolol) and potential additions to HF medication  regimen as BP allows (ACEI/ARB/ARNI, spironolactone, SGLT2i) - Patient's wife verbalizes understanding that over the next few months, these medication doses may change and more medications may be added to optimize HF regimen - Patient's wife has been  educated on basic disease state pathophysiology and goals of therapy - Time spent (30 mins)   Vertis Kelch, PharmD, BCPS HF Stewardship Pharmacist Phone [phone] 12/05/2019       10:34 AM

## 2019-12-06 ENCOUNTER — Emergency Department (HOSPITAL_COMMUNITY): Payer: Medicare Other

## 2019-12-06 ENCOUNTER — Inpatient Hospital Stay (HOSPITAL_COMMUNITY)
Admission: EM | Admit: 2019-12-06 | Discharge: 2019-12-12 | DRG: 291 | Disposition: A | Discharge status: Skilled Nursing Facility | Payer: Medicare Other | Attending: Internal Medicine | Admitting: Internal Medicine

## 2019-12-06 ENCOUNTER — Telehealth: Payer: Self-pay | Admitting: *Deleted

## 2019-12-06 ENCOUNTER — Telehealth: Payer: Self-pay | Admitting: Adult Health

## 2019-12-06 ENCOUNTER — Other Ambulatory Visit: Payer: Self-pay

## 2019-12-06 ENCOUNTER — Telehealth: Payer: Self-pay | Admitting: Primary Care

## 2019-12-06 ENCOUNTER — Encounter (HOSPITAL_COMMUNITY): Payer: Self-pay

## 2019-12-06 DIAGNOSIS — R42 Dizziness and giddiness: Secondary | ICD-10-CM | POA: Diagnosis not present

## 2019-12-06 DIAGNOSIS — E1169 Type 2 diabetes mellitus with other specified complication: Secondary | ICD-10-CM | POA: Diagnosis not present

## 2019-12-06 DIAGNOSIS — R54 Age-related physical debility: Secondary | ICD-10-CM | POA: Diagnosis present

## 2019-12-06 DIAGNOSIS — S0990XA Unspecified injury of head, initial encounter: Secondary | ICD-10-CM | POA: Diagnosis not present

## 2019-12-06 DIAGNOSIS — I152 Hypertension secondary to endocrine disorders: Secondary | ICD-10-CM | POA: Diagnosis present

## 2019-12-06 DIAGNOSIS — I252 Old myocardial infarction: Secondary | ICD-10-CM | POA: Diagnosis not present

## 2019-12-06 DIAGNOSIS — Z87891 Personal history of nicotine dependence: Secondary | ICD-10-CM

## 2019-12-06 DIAGNOSIS — J439 Emphysema, unspecified: Secondary | ICD-10-CM | POA: Diagnosis present

## 2019-12-06 DIAGNOSIS — Z85118 Personal history of other malignant neoplasm of bronchus and lung: Secondary | ICD-10-CM

## 2019-12-06 DIAGNOSIS — Z20822 Contact with and (suspected) exposure to covid-19: Secondary | ICD-10-CM | POA: Diagnosis present

## 2019-12-06 DIAGNOSIS — J9621 Acute and chronic respiratory failure with hypoxia: Secondary | ICD-10-CM | POA: Diagnosis present

## 2019-12-06 DIAGNOSIS — I4821 Permanent atrial fibrillation: Secondary | ICD-10-CM | POA: Diagnosis not present

## 2019-12-06 DIAGNOSIS — R55 Syncope and collapse: Secondary | ICD-10-CM | POA: Diagnosis present

## 2019-12-06 DIAGNOSIS — Z7401 Bed confinement status: Secondary | ICD-10-CM | POA: Diagnosis not present

## 2019-12-06 DIAGNOSIS — Z95810 Presence of automatic (implantable) cardiac defibrillator: Secondary | ICD-10-CM | POA: Diagnosis not present

## 2019-12-06 DIAGNOSIS — I428 Other cardiomyopathies: Secondary | ICD-10-CM | POA: Diagnosis present

## 2019-12-06 DIAGNOSIS — R296 Repeated falls: Secondary | ICD-10-CM | POA: Diagnosis present

## 2019-12-06 DIAGNOSIS — M2550 Pain in unspecified joint: Secondary | ICD-10-CM | POA: Diagnosis not present

## 2019-12-06 DIAGNOSIS — Z8701 Personal history of pneumonia (recurrent): Secondary | ICD-10-CM

## 2019-12-06 DIAGNOSIS — Z79899 Other long term (current) drug therapy: Secondary | ICD-10-CM

## 2019-12-06 DIAGNOSIS — Z66 Do not resuscitate: Secondary | ICD-10-CM | POA: Diagnosis not present

## 2019-12-06 DIAGNOSIS — E119 Type 2 diabetes mellitus without complications: Secondary | ICD-10-CM

## 2019-12-06 DIAGNOSIS — L89151 Pressure ulcer of sacral region, stage 1: Secondary | ICD-10-CM | POA: Diagnosis present

## 2019-12-06 DIAGNOSIS — Z8673 Personal history of transient ischemic attack (TIA), and cerebral infarction without residual deficits: Secondary | ICD-10-CM

## 2019-12-06 DIAGNOSIS — R0789 Other chest pain: Secondary | ICD-10-CM | POA: Diagnosis present

## 2019-12-06 DIAGNOSIS — I5023 Acute on chronic systolic (congestive) heart failure: Secondary | ICD-10-CM | POA: Diagnosis not present

## 2019-12-06 DIAGNOSIS — Z9981 Dependence on supplemental oxygen: Secondary | ICD-10-CM | POA: Diagnosis not present

## 2019-12-06 DIAGNOSIS — R64 Cachexia: Secondary | ICD-10-CM | POA: Diagnosis present

## 2019-12-06 DIAGNOSIS — R079 Chest pain, unspecified: Secondary | ICD-10-CM | POA: Diagnosis not present

## 2019-12-06 DIAGNOSIS — I10 Essential (primary) hypertension: Secondary | ICD-10-CM | POA: Diagnosis not present

## 2019-12-06 DIAGNOSIS — J449 Chronic obstructive pulmonary disease, unspecified: Secondary | ICD-10-CM | POA: Diagnosis not present

## 2019-12-06 DIAGNOSIS — Z7984 Long term (current) use of oral hypoglycemic drugs: Secondary | ICD-10-CM

## 2019-12-06 DIAGNOSIS — I5043 Acute on chronic combined systolic (congestive) and diastolic (congestive) heart failure: Secondary | ICD-10-CM | POA: Diagnosis present

## 2019-12-06 DIAGNOSIS — R0600 Dyspnea, unspecified: Secondary | ICD-10-CM | POA: Diagnosis not present

## 2019-12-06 DIAGNOSIS — R0902 Hypoxemia: Secondary | ICD-10-CM | POA: Diagnosis not present

## 2019-12-06 DIAGNOSIS — I5084 End stage heart failure: Secondary | ICD-10-CM | POA: Diagnosis present

## 2019-12-06 DIAGNOSIS — I5032 Chronic diastolic (congestive) heart failure: Secondary | ICD-10-CM | POA: Diagnosis not present

## 2019-12-06 DIAGNOSIS — E785 Hyperlipidemia, unspecified: Secondary | ICD-10-CM | POA: Diagnosis present

## 2019-12-06 DIAGNOSIS — Z7901 Long term (current) use of anticoagulants: Secondary | ICD-10-CM | POA: Diagnosis not present

## 2019-12-06 DIAGNOSIS — R0602 Shortness of breath: Secondary | ICD-10-CM | POA: Diagnosis not present

## 2019-12-06 DIAGNOSIS — I272 Pulmonary hypertension, unspecified: Secondary | ICD-10-CM | POA: Diagnosis present

## 2019-12-06 DIAGNOSIS — K639 Disease of intestine, unspecified: Secondary | ICD-10-CM | POA: Diagnosis present

## 2019-12-06 DIAGNOSIS — E1159 Type 2 diabetes mellitus with other circulatory complications: Secondary | ICD-10-CM | POA: Diagnosis not present

## 2019-12-06 DIAGNOSIS — I5042 Chronic combined systolic (congestive) and diastolic (congestive) heart failure: Secondary | ICD-10-CM | POA: Diagnosis not present

## 2019-12-06 DIAGNOSIS — M545 Low back pain: Secondary | ICD-10-CM | POA: Diagnosis not present

## 2019-12-06 DIAGNOSIS — Z823 Family history of stroke: Secondary | ICD-10-CM

## 2019-12-06 DIAGNOSIS — I110 Hypertensive heart disease with heart failure: Secondary | ICD-10-CM | POA: Diagnosis not present

## 2019-12-06 DIAGNOSIS — Z515 Encounter for palliative care: Secondary | ICD-10-CM | POA: Diagnosis not present

## 2019-12-06 DIAGNOSIS — F339 Major depressive disorder, recurrent, unspecified: Secondary | ICD-10-CM | POA: Diagnosis not present

## 2019-12-06 DIAGNOSIS — Z7982 Long term (current) use of aspirin: Secondary | ICD-10-CM

## 2019-12-06 DIAGNOSIS — F329 Major depressive disorder, single episode, unspecified: Secondary | ICD-10-CM | POA: Diagnosis present

## 2019-12-06 DIAGNOSIS — Z8679 Personal history of other diseases of the circulatory system: Secondary | ICD-10-CM

## 2019-12-06 DIAGNOSIS — M546 Pain in thoracic spine: Secondary | ICD-10-CM | POA: Diagnosis not present

## 2019-12-06 DIAGNOSIS — Z888 Allergy status to other drugs, medicaments and biological substances status: Secondary | ICD-10-CM

## 2019-12-06 DIAGNOSIS — Z7189 Other specified counseling: Secondary | ICD-10-CM | POA: Diagnosis not present

## 2019-12-06 DIAGNOSIS — Z953 Presence of xenogenic heart valve: Secondary | ICD-10-CM

## 2019-12-06 DIAGNOSIS — L8990 Pressure ulcer of unspecified site, unspecified stage: Secondary | ICD-10-CM | POA: Insufficient documentation

## 2019-12-06 LAB — CBC
HCT: 36.6 % — ABNORMAL LOW (ref 39.0–52.0)
Hemoglobin: 10.2 g/dL — ABNORMAL LOW (ref 13.0–17.0)
MCH: 23.8 pg — ABNORMAL LOW (ref 26.0–34.0)
MCHC: 27.9 g/dL — ABNORMAL LOW (ref 30.0–36.0)
MCV: 85.3 fL (ref 80.0–100.0)
Platelets: 295 10*3/uL (ref 150–400)
RBC: 4.29 MIL/uL (ref 4.22–5.81)
RDW: 27.4 % — ABNORMAL HIGH (ref 11.5–15.5)
WBC: 6.8 10*3/uL (ref 4.0–10.5)
nRBC: 0 % (ref 0.0–0.2)

## 2019-12-06 LAB — BASIC METABOLIC PANEL
Anion gap: 11 (ref 5–15)
BUN: 22 mg/dL (ref 8–23)
CO2: 29 mmol/L (ref 22–32)
Calcium: 8.6 mg/dL — ABNORMAL LOW (ref 8.9–10.3)
Chloride: 102 mmol/L (ref 98–111)
Creatinine, Ser: 1.18 mg/dL (ref 0.61–1.24)
GFR calc Af Amer: >60 mL/min (ref >60)
GFR calc non Af Amer: >60 mL/min (ref >60)
Glucose, Bld: 120 mg/dL — ABNORMAL HIGH (ref 70–99)
Potassium: 4.9 mmol/L (ref 3.5–5.1)
Sodium: 142 mmol/L (ref 135–145)

## 2019-12-06 LAB — SARS CORONAVIRUS 2 BY RT PCR (HOSPITAL ORDER, PERFORMED IN ~~LOC~~ HOSPITAL LAB): SARS Coronavirus 2: NEGATIVE

## 2019-12-06 LAB — TROPONIN I (HIGH SENSITIVITY)
Troponin I (High Sensitivity): 19 ng/L — ABNORMAL HIGH (ref <18)
Troponin I (High Sensitivity): 19 ng/L — ABNORMAL HIGH (ref <18)

## 2019-12-06 LAB — MAGNESIUM: Magnesium: 1.9 mg/dL (ref 1.7–2.4)

## 2019-12-06 MED ORDER — BUDESON-GLYCOPYRROL-FORMOTEROL 160-9-4.8 MCG/ACT IN AERO: 2.0000 | INHALATION_SPRAY | Freq: Two times a day (BID) | RESPIRATORY_TRACT | Status: DC

## 2019-12-06 MED ORDER — BETHANECHOL CHLORIDE 10 MG PO TABS
10.0000 mg | ORAL_TABLET | Freq: Three times a day (TID) | ORAL | Status: DC
  Administered 2019-12-07 – 2019-12-12 (×18): 10 mg via ORAL
  Filled 2019-12-06 (×19): qty 1

## 2019-12-06 MED ORDER — TAMSULOSIN HCL 0.4 MG PO CAPS
0.4000 mg | ORAL_CAPSULE | Freq: Every day | ORAL | Status: DC
  Administered 2019-12-07 – 2019-12-12 (×6): 0.4 mg via ORAL
  Filled 2019-12-06 (×6): qty 1

## 2019-12-06 MED ORDER — IOHEXOL 350 MG/ML SOLN
100.0000 mL | Freq: Once | INTRAVENOUS | Status: AC | PRN
  Administered 2019-12-06: 100 mL via INTRAVENOUS

## 2019-12-06 MED ORDER — ALBUTEROL SULFATE (2.5 MG/3ML) 0.083% IN NEBU: 2.5000 mg | INHALATION_SOLUTION | RESPIRATORY_TRACT | Status: DC | PRN

## 2019-12-06 MED ORDER — SODIUM CHLORIDE 0.9% FLUSH
3.0000 mL | Freq: Two times a day (BID) | INTRAVENOUS | Status: DC
  Administered 2019-12-07 – 2019-12-11 (×11): 3 mL via INTRAVENOUS

## 2019-12-06 MED ORDER — ONDANSETRON HCL 4 MG/2ML IJ SOLN: 4.0000 mg | Freq: Four times a day (QID) | INTRAMUSCULAR | Status: DC | PRN

## 2019-12-06 MED ORDER — ACETAMINOPHEN 325 MG PO TABS: 650.0000 mg | ORAL_TABLET | Freq: Four times a day (QID) | ORAL | Status: DC | PRN

## 2019-12-06 MED ORDER — ACETAMINOPHEN 650 MG RE SUPP: 650.0000 mg | Freq: Four times a day (QID) | RECTAL | Status: DC | PRN

## 2019-12-06 MED ORDER — ONDANSETRON HCL 4 MG PO TABS: 4.0000 mg | ORAL_TABLET | Freq: Four times a day (QID) | ORAL | Status: DC | PRN

## 2019-12-06 MED ORDER — ATORVASTATIN CALCIUM 10 MG PO TABS
10.0000 mg | ORAL_TABLET | Freq: Every day | ORAL | Status: DC
  Administered 2019-12-07 – 2019-12-12 (×6): 10 mg via ORAL
  Filled 2019-12-06 (×6): qty 1

## 2019-12-06 MED ORDER — ASPIRIN EC 81 MG PO TBEC
81.0000 mg | DELAYED_RELEASE_TABLET | Freq: Every day | ORAL | Status: DC
  Administered 2019-12-07 – 2019-12-12 (×6): 81 mg via ORAL
  Filled 2019-12-06 (×6): qty 1

## 2019-12-06 NOTE — Telephone Encounter (Signed)
Thank you, agree with plan. Pressure may be playing a role in his falls. No room for ARB. Candee Furbish, MD

## 2019-12-06 NOTE — Telephone Encounter (Signed)
Please arrange for office visit to assess. Please ask him to check O2 saturations If he was on pulse O2, then he may need continuous O2

## 2019-12-06 NOTE — Telephone Encounter (Signed)
Called and spoke with sarah from [facility]. She stated when she went to go visit pt, when she checked his O2 sats, pt was satting 78-80 on his 5L O2. She stated she noticed that pt was forgetting that he needed to breathe through his nose so he could be able to get the benefit from the O2 as he was mouth breathing. Judson Roch did remind pt that he needed to breathe through his nose and once he began to do that, after about 83min,  Pt's sats came up to the 90s.  Judson Roch stated that pt was alert and oriented, no complaints of dizziness, no SOB, no complaints at all.  Stated to her that I would route this to [facility] and Dr. Elsworth Soho as an FYI to see if they had any recs and she verbalized understanding.

## 2019-12-06 NOTE — Telephone Encounter (Signed)
Ok. Continue to monitor. If O2 sustaining <90% on 5L needs to represent to ED.

## 2019-12-06 NOTE — ED Notes (Signed)
Pt placed on 6L [place]. Pt oxygen saturation currently 100%. Will continue to monitor.

## 2019-12-06 NOTE — Telephone Encounter (Signed)
Message Routed to PCP for review. 

## 2019-12-06 NOTE — Telephone Encounter (Signed)
Judson Roch was called and stated that the pts spouse called in and said that the pt had a fall and complained about having chest pain so she called 911 and they are in route to the hospital.  Judson Roch was wanting to let the office know if any question you can call her at [phone].

## 2019-12-06 NOTE — Telephone Encounter (Signed)
Received a call from [place] LPN with [facility]. She reports patient has BP 106/58, O2 ranging from 78-88% on 5L nasal cannula. Patient is not short of breath at the moment, denies dizziness. Patient is A&Ox4. Reports patient like to breath out his mouth but when breathes in through his nose that O2 comes up to 90%. Informed LPN that his BP has been running 110s/60s last two OV. Also informed her that patient has pulmonology as well. Advised LPN to advise patient that if he gets short of breath or dizzy he will need to go to ED as our office will be closed after 5 and will not reopen until Tuesday. LPN verbalized understanding and reports she will let pulmonology know as well.

## 2019-12-06 NOTE — Progress Notes (Signed)
COMMUNITY PALLIATIVE CARE SW NOTE  PATIENT NAME: [name] DOB: [birth year] MRN: [id number]  PRIMARY CARE PROVIDER: [name], NP  RESPONSIBLE PARTY:  Acct ID - Guarantor Home Phone Work Phone Relationship Acct Type  [id number] TIMITHY, [phone]  Self P/F     [address], [place], [place] [address]   Due to the COVID-19, this visit was done via telephone from my office and it was initiated and consent by this patient's wife when trying to schedule a visit.    PLAN OF CARE and INTERVENTIONS:             1. GOALS OF CARE/ ADVANCE CARE PLANNING:  Goal is for patient to remain in his home with his wife. Patient is a DNR.  2. SOCIAL/EMOTIONAL/SPIRITUAL ASSESSMENT/ INTERVENTIONS:  SW completed a telephonic visit with patient's wife per her request. She express that she was not available to do a home visit for a few weeks due their scheduled appointments. She requested that telephonic visit be done instead. Mrs. Debrosse expressed that she was overwhelmed with all the services recently added for patient and all the calls associated with it. She is sole caretaker for patient. He is hard of hearing. Patient ambulates independently in their home, but is a high fall risk and has had several falls as recent as Monday. When patient falls she has to call EMS to get him up as she is unable to. Patient is on continuous 02 at 5 liters. Patient started physical therapy with [facility] this week and they will continue to see him 2x a week. Mrs. Jasinski is worried as she has her own health issues (increased blood pressure, panic attacks, back issues and cataracts). SW discussed hiring sitters or consider placing patient. She stated that she could not afford to hire anyone or place patient. SW encouraged her to solicit help from their children, but feels that they are unable to do their work schedules and personal responsibilities. Patient has been married to his second wife for 34 years. He has a  daughter, wife has two sons. Patient has a living will, DNR and his wife serve as his POA/HCPOA. SW provided education/information regarding the palliative care program, role of team and visit frequencies, reassurance of support, assessed needs and comfort of patient and coping of PCG, obtained status update on patient.  3. PATIENT/CAREGIVER EDUCATION/ COPING:  Education provided regarding palliative care program/services and resources for in-home and facility care. Wife is overwhelmed with patient's care and her own health issues. She described that she has very limited support which includes a neighbor who help with transportation to and from appointments.  4. PERSONAL EMERGENCY PLAN: 911 has been activated when patient falls, but can also be activated for other emergencies.  5. COMMUNITY RESOURCES COORDINATION/ HEALTH CARE NAVIGATION:  Patient is currently receiving physical therapy from [facility].  6. FINANCIAL/LEGAL CONCERNS/INTERVENTIONS:  Patient's wife expressed financial concerns as to why she is unable to consider hiring in-home care or place patient into facility care      SOCIAL HX:  Social History   Tobacco Use  . Smoking status: Former Smoker    Packs/day: 1.00    Years: 45.00    Pack years: 45.00    Types: Cigarettes    Quit date: 2011    Years since quitting: 10.4  . Smokeless tobacco: Never Used  Substance Use Topics  . Alcohol use: No    Alcohol/week: 0.0 standard drinks    Comment: 08/14/2013 "  used to drink beer; quit:in 1982"    CODE STATUS:  DNR  ADVANCED DIRECTIVES: Yes MOST FORM COMPLETE:  No HOSPICE EDUCATION PROVIDED: No  PPS: Patient is hard of hearing. He ambulates independently as is at risk for falls.   Duration of telephonic visit and documentation: 30 minutes      Katheren Puller, LCSW

## 2019-12-06 NOTE — ED Triage Notes (Signed)
Pt BIB [facility] from home c/o fall. Pt has home health and was told when going to the bathroom try to ambulate as many times as you can in the hallway. Pt decided to try this while ambulating to the bathroom today. Pt became dizzy and stated he fell on his behind. Pt denies hitting his head or any LOC. Pt wears 5L [place] at home. When EMS arrived pt was cyanotic and oxygen saturation was 74-80% on 5L. Pt placed on 15L [facility] now sating 100%.

## 2019-12-06 NOTE — Telephone Encounter (Signed)
Called and spoke with pt's wife letting her know the info sated by RA. She stated that pt is currently too weak to be able to come in for an OV after recently being in the hospital. The home health nurse will be coming back next week to evaluate pt and she stated after that, if sats were still low, they would determine what needed to be done and if an OV was to be scheduled how they could get pt to office. She stated pt had no complaints other than what was noticed of low O2 sats as pt was not breathing through nose but once mouth was closed, sats came back up to 92%. Pt is on 5L continuous. Nothing further needed.

## 2019-12-06 NOTE — H&P (Signed)
History and Physical    [name] ZOX:[id number] DOB: April 05, 1946 DOA: 12/06/2019  PCP: [name], NP  Patient coming from: Home  I have personally briefly reviewed patient's old medical records in [place]  Chief Complaint: Chest pain  HPI: [name] is a 74 y.o. male with medical history significant for end-stage chronic combined systolic and diastolic CHF (EF 81-19%) s/p BiV ICD, chronic severe COPD/emphysema with chronic respiratory failure on 5 L of home O2 via [place], permanent A. fib on aspirin alone (Hx of ICH while on Coumadin) s/p AV nodal ablation, severe mitral regurgitation s/p bioprosthetic mitral valve replacement, pulmonary hypertension, non-small cell left lung cancer s/p wedge resection, type 2 diabetes, hypertension, and hyperlipidemia who presents to the ED for evaluation of chest pain.  Patient recently hospitalized from 11/13/2019-11/25/2019 for acute on chronic CHF.  He was diuresed while in hospital.  Therapy evaluation recommended SNF however patient declined and he was discharged to home with home health and increase O2 requirement to 5 L via [place].  He was discharged on Lasix 60 mg twice daily.  Patient states around 46 AM earlier today (12/06/2019) he developed significant chest pain across his chest when he was walking down the hall.  He went into the kitchen and says he blacked out and fell to the ground.  He denied any significant injury.  He has been having continued chest pain since.  He came to the ED for further evaluation.  He reports continued swelling in both of his lower extremities.  He denies any significant change in his breathing.  He denies any cough.  He denies any subjective fevers, chills, diaphoresis, abdominal pain, or dysuria.  ED Course:  Initial vitals showed BP 105/61, pulse 70, RR 18, temp 97.2 Fahrenheit, SPO2 96% on 15 L NRB.  Patient was weaned down to 6 L O2 via [place].  Labs are notable for WBC 6.8, hemoglobin 10.2, platelets  295,000, sodium 142, potassium 4.9, bicarb 29, BUN 22, creatinine 1.18, serum glucose 120, magnesium 1.9, high-sensitivity troponin I 19.  EKG shows V paced rhythm.  SARS-CoV-2 PCR is negative.  2 view chest x-ray shows stable cardiomegaly, diffuse interstitial infiltrates, small bilateral pleural effusions, and increased left basilar atelectasis versus consolidation.  Thoracic spine x-ray is negative for acute findings.  Lumbar spine x-ray is negative for acute/traumatic lumbar spine pathology.  CT head and cervical spine without contrast are negative for acute intracranial hemorrhage or acute/traumatic cervical spine pathology.  Acute or possibly subacute fracture of the left clavicular head is noted.  CT chest/abdomen/pelvis dissection study is negative for evidence of acute vascular abnormality, thoracic aortic dissection, or PE.  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  Small bilateral pleural effusions with partially loculated right-sided pleural effusion noted.  Masslike appearance of the cecum with multiple small mildly enlarged regional lymph nodes noted.  Distended urinary bladder and findings of suspected underlying cirrhosis are seen.  Severe emphysematous changes with infectious or reactive bronchiolitis at the lung bases also reported.  The hospitalist service was consulted to me for further evaluation and management.  Review of Systems: All systems reviewed and are negative except as documented in history of present illness above.   Past Medical History:  Diagnosis Date  . Atrial septal defect    Closed with surgery January, 2010  . Automatic implantable cardioverter-defibrillator in situ    LV dysfunction and pacer needed for AV node lesion  . Chronic combined systolic and diastolic CHF (congestive heart  failure) ([place])   . Colon polyps   . COPD (chronic obstructive pulmonary disease) (HCC)    O2- 2 liters, nasal cannula, q night   . CVA (cerebral vascular accident) ([place])  2009   denies residual on 08/14/2013  . Diabetes mellitus without complication ([place])    type 2  . Dyslipidemia   . Endocarditis    Bacterial, 2009  . Hypertension   . Intracranial hemorrhage (HCC)    Coumadin cannot be used because of the history of his bleed  . Lung cancer ([place]) 11/29/2013   T1N0 Stage Ia non-small cell carcinoma left lung treated with wedge resection  . Myocardial infarction ([place]) 2010  . Pacemaker    combo pacer and icd  . Permanent atrial fibrillation    Originally Coumadin use for atrial fibrillation  //   he had intracerebral hemorrhage with an INR of 2.3 June, 2009. Anticoagulation could no longer be used.  //  Rapid atrial fibrillation after inferior MI October, 2010..........Marland Kitchen AV node ablation done at that time with ICD pacemaker placed (EF 35%).   //   Left atrial appendage tied off at the time of mitral valve surgery January, 2010 (maze pro  . Pneumonia 07/2018  . Pulmonary hypertension (HCC)    Moderate  . Renal artery stenosis (HCC)    Mild by history  . 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 Spontaneous pneumothorax    right thoracotomy - distant past  . Status post minimally invasive mitral valve replacement with bioprosthetic valve    33 mm Medtronic Mosaic porcine bioprosthesis placed via right mini thoracotomy for bacterial endocarditis complicated by severe MR and CHF   . Thoracic aortic aneurysm ([place]) 08/11/2016   a - Chest CTA 1/18:  Aneurysmal dilatation of aortic root is noted at 5.1 cm.     Past Surgical History:  Procedure Laterality Date  . APPENDECTOMY    . ASD REPAIR, SECUNDUM  07/17/2008   pericardial patch closure of ASD  . AV NODE ABLATION  07/2008   for rapid atrial fib  . CARDIAC CATHETERIZATION    . CARDIAC DEFIBRILLATOR PLACEMENT  ~ [address] [place]  . CARDIAC VALVE REPLACEMENT    . CATARACT EXTRACTION W/ INTRAOCULAR LENS  IMPLANT, BILATERAL Bilateral   . ESOPHAGOGASTRODUODENOSCOPY (EGD) WITH PROPOFOL N/A 09/29/2016   Procedure:  ESOPHAGOGASTRODUODENOSCOPY (EGD) WITH PROPOFOL;  Surgeon: [name], MD;  Location: [facility] ENDOSCOPY;  Service: Endoscopy;  Laterality: N/A;  . HERNIA REPAIR    . IMPLANTABLE CARDIOVERTER DEFIBRILLATOR (ICD) GENERATOR CHANGE N/A 02/06/2012   Procedure: ICD GENERATOR CHANGE;  Surgeon: [name], MD;  Location: [facility] CATH LAB;  Service: Cardiovascular;  Laterality: N/A;  . INSERT / REPLACE / REMOVE PACEMAKER    . MASS BIOPSY Left    neck mass  . MITRAL VALVE REPLACEMENT Right 07/17/2008   14mm Medtronic Mosaic porcine bioprosthesis  . PENILE PROSTHESIS IMPLANT    . RIGHT HEART CATHETERIZATION N/A 08/16/2013   Procedure: RIGHT HEART CATH;  Surgeon: Josue Hector, MD;  Location[facility] C[facility] LAB;  Service: Cardiovascular;  Laterality: N/A;  . TEE WITHOUT CARDIOVERSION N/A 09/06/2016   Procedure: TRANSESOPHAGEAL ECHOCARDIOGRAM (TEE);  Surgeon: Dorothy Spark, MD;  Location: [facility] ENDOSCOPY;  Service: Cardiovascular;  Laterality: N/A;  . THORACOTOMY Right 1970's   spontaneous pneumothorax - while in the [place]  . TOE SURGERY     left foot hammer toe  . TONSILLECTOMY    . VIDEO ASSISTED THORACOSCOPY (VATS)/WEDGE RESECTION Left  11/29/2013   Procedure: Video assisted thoracoscopy for wedge resection; mini thoracotomy;  Surgeon: [name], MD;  Location: [place];  Service: Thoracic;  Laterality: Left;    Social History:  reports that he quit smoking about 10 years ago. His smoking use included cigarettes. He has a 45.00 pack-year smoking history. He has never used smokeless tobacco. He reports that he does not drink alcohol or use drugs.  Allergies  Allergen Reactions  . Anticoagulant Compound Other (See Comments)    Pt had intracranial bleed, therefore all anticoagulation is contraindicated per Dr. Ron Parker  . Other Other (See Comments)    Per Dr. Halford Chessman (Surgeon): stated that the patient cannot be put under for any surgery, as he has an enlarged aorta. He would stand only a 50/50 chance of  surviving. He has lung issues, diminished lung tissue, COPD, and emphysema.  . Warfarin Sodium Other (See Comments)    Pt had intracranial bleed, therefore all anticoagulation is contraindicated per Dr. Ron Parker    Family History  Problem Relation Age of Onset  . Stomach cancer Father   . Stroke Mother      Prior to Admission medications   Medication Sig Start Date End Date Taking? Authorizing Provider  acetaminophen (TYLENOL) 500 MG tablet Take 2 tablets (1,000 mg total) by mouth every 8 (eight) hours. 11/25/19 01/24/20  Mercy Riding, MD  aspirin EC 81 MG tablet Take 1 tablet (81 mg total) by mouth daily. 12/07/12   Carlena Bjornstad, MD  atorvastatin (LIPITOR) 10 MG tablet Take 10 mg by mouth daily.    [provider]  bethanechol (URECHOLINE) 10 MG tablet Take 1 tablet (10 mg total) by mouth 3 (three) times daily. 12/03/19   Nafziger, Tommi Rumps, NP  Budeson-Glycopyrrol-Formoterol (BREZTRI AEROSPHERE) 160-9-4.8 MCG/ACT AERO Inhale 2 puffs into the lungs in the morning and at bedtime. 11/08/19   Martyn Ehrich, NP  furosemide (LASIX) 20 MG tablet Take 3 tablets (60 mg total) by mouth 2 (two) times daily. 12/03/19   Nafziger, Tommi Rumps, NP  metFORMIN (GLUCOPHAGE) 1000 MG tablet TAKE 1 TABLET BY MOUTH TWICE A DAY WITH MEALS 12/05/19   Nafziger, Tommi Rumps, NP  metoprolol succinate (TOPROL-XL) 25 MG 24 hr tablet Take 0.5 tablets (12.5 mg total) by mouth daily. 11/26/19   [name], MD  nitroGLYCERIN (NITROSTAT) 0.4 MG SL tablet Place 0.4 mg under the tongue every 5 (five) minutes x 3 doses as needed for chest pain.     [provider]  potassium chloride SA (KLOR-CON) 20 MEQ tablet Take 2 tablets (40 mEq total) by mouth daily. 08/12/19 11/13/19  Nafziger, Tommi Rumps, NP  tamsulosin (FLOMAX) 0.4 MG CAPS capsule Take 1 capsule (0.4 mg total) by mouth daily after supper. 12/03/19   Nafziger, Tommi Rumps, NP  vitamin B-12 1000 MCG tablet Take 1 tablet (1,000 mcg total) by mouth daily. 11/25/19   Mercy Riding, MD     Physical Exam: Vitals:   12/06/19 2000 12/06/19 2218 12/06/19 2358 12/07/19 0021  BP: 109/62 110/62 (!) 106/56 118/61  Pulse: 74 72 75 72  Resp: 14 16 15 17   Temp:   (!) 97.4 F (36.3 C) 97.9 F (36.6 C)  TempSrc:   Oral Oral  SpO2: 92% 97% 94% 94%   Constitutional: Chronically ill-appearing man sitting up in bed, cachectic appearance, NAD, calm, comfortable Eyes: PERRL, lids and conjunctivae normal ENMT: Mucous membranes are moist. Posterior pharynx clear of any exudate or lesions.hard of hearing. Neck: normal, supple,  no masses. Respiratory: Distant breath sounds diffusely. Normal respiratory effort. No accessory muscle use.  Cardiovascular: Regular rate and rhythm, no murmurs / rubs / gallops.  Trace to +1 bilateral lower extremity edema.  ICD in place right chest wall. Abdomen: no tenderness, no masses palpated. No hepatosplenomegaly. Bowel sounds positive.  Musculoskeletal: no clubbing / cyanosis. No joint deformity upper and lower extremities. Good ROM, no contractures. Normal muscle tone.  No tenderness along left clavicle or diminished range of motion at left shoulder. Skin: Bruising bilateral upper extremities.  No rashes, ulcers. No induration Neurologic: CN 2-12 grossly intact. Sensation intact, Strength 5/5 in all 4.  Psychiatric: Alert and oriented x 3. Normal mood.   Labs on Admission: I have personally reviewed following labs and imaging studies  CBC: Recent Labs  Lab 12/06/19 1824  WBC 6.8  HGB 10.2*  HCT 36.6*  MCV 85.3  PLT 254   Basic Metabolic Panel: Recent Labs  Lab 12/06/19 1824  NA 142  K 4.9  CL 102  CO2 29  GLUCOSE 120*  BUN 22  CREATININE 1.18  CALCIUM 8.6*  MG 1.9   GFR: Estimated Creatinine Clearance: 57 mL/min (by C-G formula based on SCr of 1.18 mg/dL). Liver Function Tests: No results for input(s): AST, ALT, ALKPHOS, BILITOT, PROT, ALBUMIN in the last 168 hours. No results for input(s): LIPASE, AMYLASE in the last 168  hours. No results for input(s): AMMONIA in the last 168 hours. Coagulation Profile: No results for input(s): INR, PROTIME in the last 168 hours. Cardiac Enzymes: No results for input(s): CKTOTAL, CKMB, CKMBINDEX, TROPONINI in the last 168 hours. BNP (last 3 results) No results for input(s): PROBNP in the last 8760 hours. HbA1C: No results for input(s): HGBA1C in the last 72 hours. CBG: No results for input(s): GLUCAP in the last 168 hours. Lipid Profile: No results for input(s): CHOL, HDL, LDLCALC, TRIG, CHOLHDL, LDLDIRECT in the last 72 hours. Thyroid Function Tests: No results for input(s): TSH, T4TOTAL, FREET4, T3FREE, THYROIDAB in the last 72 hours. Anemia Panel: No results for input(s): VITAMINB12, FOLATE, FERRITIN, TIBC, IRON, RETICCTPCT in the last 72 hours. Urine analysis:    Component Value Date/Time   COLORURINE YELLOW 06/07/2019 1231   APPEARANCEUR CLOUDY (A) 06/07/2019 1231   LABSPEC 1.013 06/07/2019 1231   PHURINE 5.0 06/07/2019 1231   GLUCOSEU 150 (A) 06/07/2019 1231   HGBUR SMALL (A) 06/07/2019 1231   HGBUR negative 04/05/2010 0837   BILIRUBINUR NEGATIVE 06/07/2019 1231   BILIRUBINUR n 03/27/2012 1323   KETONESUR NEGATIVE 06/07/2019 1231   PROTEINUR NEGATIVE 06/07/2019 1231   UROBILINOGEN 1.0 11/28/2013 1000   NITRITE NEGATIVE 06/07/2019 1231   LEUKOCYTESUR LARGE (A) 06/07/2019 1231    Radiological Exams on Admission: DG Chest 2 View  Result Date: 12/06/2019 CLINICAL DATA:  Chest pain and shortness of breath. Previous myocardial infarct congestive heart failure. EXAM: CHEST - 2 VIEW COMPARISON:  11/17/2019 FINDINGS: Moderate cardiac enlargement shows no significant change. Transvenous pacemaker remains in appropriate position. Pulmonary emphysema again noted. Increased atelectasis or consolidation is seen in the retrocardiac left lower lobe since prior study. Small bilateral pleural effusions show no significant change. Diffuse interstitial infiltrates are also  stable, consistent with diffuse interstitial edema. IMPRESSION: Increased left basilar atelectasis versus consolidation. Stable cardiomegaly, diffuse interstitial infiltrates, and small bilateral pleural effusions, consistent with congestive heart failure. Electronically Signed   By: Marlaine Hind M.D.   On: 12/06/2019 18:55   DG Thoracic Spine 2 View  Result Date: 12/06/2019 CLINICAL  DATA:  Fall.  Thoracic back pain.  Initial encounter. EXAM: THORACIC SPINE 2 VIEWS COMPARISON:  None. FINDINGS: There is no evidence of thoracic spine fracture. Alignment is normal. No focal lytic or sclerotic bone lesions identified. Mild degenerative disc disease seen in the mid and lower thoracic spine, and cervical spine. Generalized osteopenia also noted. IMPRESSION: No acute findings. Degenerative spondylosis, as described above.  Osteopenia. Electronically Signed   By: Marlaine Hind M.D.   On: 12/06/2019 19:40   DG Lumbar Spine Complete  Result Date: 12/06/2019 CLINICAL DATA:  74 year old male with fall and back pain. EXAM: LUMBAR SPINE - COMPLETE 4+ VIEW COMPARISON:  Lumbar spine radiograph dated 11/16/2019. FINDINGS: Five lumbar type vertebra. There is no acute fracture or subluxation of the lumbar spine. Multilevel mild chronic compression injury most prominent involving L1 and L4. Multilevel degenerative changes with bone spurring. Multilevel disc desiccation and vacuum phenomena. Grade 1 L3-L4 retrolisthesis and L4-L5 anterolisthesis. Atherosclerotic calcification of the abdominal aorta. IMPRESSION: No acute/traumatic lumbar spine pathology. Electronically Signed   By: Anner Crete M.D.   On: 12/06/2019 19:39   CT Head Wo Contrast  Result Date: 12/06/2019 CLINICAL DATA:  74 year old male with head trauma. EXAM: CT HEAD WITHOUT CONTRAST CT CERVICAL SPINE WITHOUT CONTRAST TECHNIQUE: Multidetector CT imaging of the head and cervical spine was performed following the standard protocol without intravenous contrast.  Multiplanar CT image reconstructions of the cervical spine were also generated. COMPARISON:  Head CT dated 09/24/2015. FINDINGS: CT HEAD FINDINGS Brain: Moderate age-related atrophy and chronic microvascular ischemic changes. 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 There is no acute intracranial hemorrhage. No mass effect midline shift. No extra-axial fluid collection. Vascular: No hyperdense vessel or unexpected calcification. Skull: Normal. Negative for fracture or focal lesion. Sinuses/Orbits: No acute finding. Other: None CT CERVICAL SPINE FINDINGS Alignment: No acute subluxation. Skull base and vertebrae: No acute fracture. Osteopenia. Soft tissues and spinal canal: No prevertebral fluid or swelling. No visible canal hematoma. Disc levels:  Multilevel degenerative changes. Upper chest: Emphysema. Partially visualized moderate to large left pleural effusion or hemothorax. Lobulated right pleural effusion or thickening. Increase in the size of the pleural effusions compared to the CT of 11/07/2019. Other: Fracture of the left clavicular head, acute or possibly subacute new since the prior CT of 11/07/2019. IMPRESSION: 1. No acute intracranial hemorrhage. 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 2.  acute/traumatic cervical spine pathology. 3. Fracture of the left clavicular head, acute or possibly subacute new since the prior CT of 11/07/2019. 4. Partially visualized moderate to large left pleural effusion or hemothorax. Increase in the size of the pleural effusions compared to the CT of 11/07/2019. Electronically Signed   By: Anner Crete M.D.   On: 12/06/2019 20:02   CT Cervical Spine Wo Contrast  Result Date: 12/06/2019 CLINICAL DATA:  74 year old male with head trauma. EXAM: CT HEAD WITHOUT CONTRAST CT CERVICAL SPINE WITHOUT CONTRAST TECHNIQUE: Multidetector CT imaging of the head and cervical spine was performed following the standard protocol without  intravenous contrast. Multiplanar CT image reconstructions of the cervical spine were also generated. COMPARISON:  Head CT dated 09/24/2015. FINDINGS: CT HEAD FINDINGS Brain: Moderate age-related atrophy and chronic microvascular ischemic changes. 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 There is no acute intracranial hemorrhage. No mass effect midline shift. No extra-axial fluid collection. Vascular: No hyperdense vessel or unexpected calcification. Skull: Normal. Negative for fracture or focal lesion. Sinuses/Orbits: No acute finding. Other: None CT CERVICAL SPINE FINDINGS Alignment: No acute  subluxation. Skull base and vertebrae: No acute fracture. Osteopenia. Soft tissues and spinal canal: No prevertebral fluid or swelling. No visible canal hematoma. Disc levels:  Multilevel degenerative changes. Upper chest: Emphysema. Partially visualized moderate to large left pleural effusion or hemothorax. Lobulated right pleural effusion or thickening. Increase in the size of the pleural effusions compared to the CT of 11/07/2019. Other: Fracture of the left clavicular head, acute or possibly subacute new since the prior CT of 11/07/2019. IMPRESSION: 1. No acute intracranial hemorrhage. 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 2.  acute/traumatic cervical spine pathology. 3. Fracture of the left clavicular head, acute or possibly subacute new since the prior CT of 11/07/2019. 4. Partially visualized moderate to large left pleural effusion or hemothorax. Increase in the size of the pleural effusions compared to the CT of 11/07/2019. Electronically Signed   By: Anner Crete M.D.   On: 12/06/2019 20:02   CT Angio Chest/Abd/Pel for Dissection W and/or Wo Contrast  Result Date: 12/06/2019 CLINICAL DATA:  Chest pain.  Concern for aortic dissection. EXAM: CT ANGIOGRAPHY CHEST, ABDOMEN AND PELVIS TECHNIQUE: Non-contrast CT of the chest was initially obtained. Multidetector CT imaging  through the chest, abdomen and pelvis was performed using the standard protocol during bolus administration of intravenous contrast. Multiplanar reconstructed images and MIPs were obtained and reviewed to evaluate the vascular anatomy. CONTRAST:  165mL OMNIPAQUE IOHEXOL 350 MG/ML SOLN COMPARISON:  11/07/2019 FINDINGS: CTA CHEST FINDINGS Cardiovascular: There is significant cardiac enlargement. The main pulmonary artery is dilated without evidence for an acute pulmonary embolism. There is no evidence for thoracic aortic dissection. Coronary artery calcifications are noted. The arch vessels are grossly patent. 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 This is essentially unchanged from prior study dated 11/07/2019. Mediastinum/Nodes: --there is mild mediastinal adenopathy. This is essentially stable from prior study. --No axillary lymphadenopathy. --No supraclavicular lymphadenopathy. --Normal thyroid gland. --The esophagus is unremarkable Lungs/Pleura: There are moderate severe emphysematous changes bilaterally. There is interlobular septal thickening. There are moderate-sized bilateral pleural effusions, left greater than right. The right-sided pleural effusion appears to be at least partially loculated. There is bronchial wall thickening and mild mucous plugging at the lung bases bilaterally. There are postsurgical changes in the left upper lobe likely related to prior wedge resection. Musculoskeletal: No chest wall abnormality. No acute or significant osseous findings. Review of the MIP images confirms the above findings. CTA ABDOMEN AND PELVIS FINDINGS VASCULAR Aorta: Normal caliber aorta without aneurysm, dissection, vasculitis or significant stenosis. Celiac: There is a mild loop shaped curvature of the celiac axis with associated narrowing. This can be seen in patients with median arcuate ligament syndrome. SMA: There is variant hepatic arterial  anatomy with a replaced common hepatic artery arising from the SMA. Renals: Multiple bilateral renal arteries are noted, all of which appear to be grossly patent. IMA: Patent without evidence of aneurysm, dissection, vasculitis or significant stenosis. Inflow: Patent without evidence of aneurysm, dissection, vasculitis or significant stenosis. Veins: No obvious venous abnormality within the limitations of this arterial phase study. Review of the MIP images confirms the above findings. NON-VASCULAR Hepatobiliary: The liver appears cirrhotic. Normal gallbladder.There is no biliary ductal dilation. Pancreas: Normal contours without ductal dilatation. No peripancreatic fluid collection. Spleen: Unremarkable. Adrenals/Urinary Tract: --Adrenal glands: Unremarkable. --Right kidney/ureter: No hydronephrosis or radiopaque kidney stones. --Left kidney/ureter: No hydronephrosis or radiopaque kidney stones. --Urinary bladder: The urinary bladder is significantly distended.  Stomach/Bowel: --Stomach/Duodenum: No hiatal hernia or other gastric abnormality. Normal duodenal course and caliber. --Small bowel: Unremarkable. --Colon: There is an masslike appearance of the cecum with multiple small mildly enlarged regional lymph nodes. --Appendix: Normal. Lymphatic: --No retroperitoneal lymphadenopathy. --No mesenteric lymphadenopathy. --No pelvic or inguinal lymphadenopathy. Reproductive: Prostate gland is enlarged. Other: There is a mild amount presacral free fluid. There is no free air. There is mild body wall edema. Musculoskeletal. Old compression fractures are noted throughout the lumbar spine with multilevel degenerative changes. These are essentially stable from prior study. Review of the MIP images confirms the above findings. IMPRESSION: 1. No evidence for an acute vascular abnormality. There is no evidence for a thoracic aortic dissection. No evidence for an acute pulmonary embolism. 2. Relatively stable appearance of the  previously demonstrated sinus of Valsalva aneurysm. 3. Profound cardiomegaly with findings of volume overload including small bilateral pleural effusions and body wall edema. The right-sided pleural effusion appears to be at least partially loculated. 4. Findings are concerning for a cecal mass as detailed above. Follow-up with nonemergent colonoscopy is recommended. 5. Significantly distended urinary bladder. 6. Findings suspicious for underlying cirrhosis. 7. Relatively stable mediastinal and hilar adenopathy of unknown clinical significance. 8. Severe emphysematous changes with findings consistent with infectious or reactive bronchiolitis at the lung bases. Aortic Atherosclerosis (ICD10-I70.0) and Emphysema (ICD10-J43.9). Electronically Signed   By: Constance Holster M.D.   On: 12/06/2019 20:08    EKG: Independently reviewed.  V-paced rhythm.  Assessment/Plan Principal Problem:   Syncope Active Problems:   COPD GOLD II   Hypertension associated with diabetes ([place])   Hyperlipidemia associated with type 2 diabetes mellitus (HCC)   Permanent atrial fibrillation   ICD (implantable cardioverter-defibrillator), biventricular, in situ   H/O intracranial hemorrhage   Acute on chronic respiratory failure with hypoxia (HCC)   Type 2 diabetes mellitus (HCC)   Chronic combined systolic and diastolic CHF (congestive heart failure) ([place])  [name] is a 74 y.o. male with medical history significant for end-stage chronic combined systolic and diastolic CHF (EF 25-85%) s/p BiV ICD, chronic severe COPD/emphysema with chronic respiratory failure on 5 L of home O2 via [place], permanent A. fib on aspirin alone (Hx of ICH while on Coumadin) s/p AV nodal ablation, severe mitral regurgitation s/p bioprosthetic mitral valve replacement, pulmonary hypertension, non-small cell left lung cancer s/p wedge resection, type 2 diabetes, hypertension, and hyperlipidemia who is admitted with syncope and acute on chronic  respiratory failure with hypoxia.  Syncope with fall at home Questionable acute or subacute fracture of the left clavicular head seen on CT imaging: Patient reports syncopal episode at home from standing position after walking a short distance.  He denies any significant injury.  There is question of left clavicular head fracture on CT imaging however patient has no tenderness to palpation over his left clavicle, diminished range of motion, or reproducible pain with movement against resistance.  He says he currently has home health physical therapy. -Continue PT/OT while in hospital -Would ultimately benefit most from SNF but not sure patient willing to go anywhere other than home at this time -Obtain orthostatic vital signs -Continue to monitor on telemetry -Hold home Toprol-XL and Lasix for now  Acute on chronic respiratory failure with hypoxia Chronic combined systolic and diastolic CHF s/p AICD Bilateral pleural effusions Pulmonary hypertension: Reported hypoxia 74-80% on home 5 L O2 on EMS arrival.  At time of admission, saturation has improved while on 5 L O2.  He does have  end-stage CHF and COPD/emphysema with CT findings of pulmonary edema and bilateral pleural effusions.  Given his soft blood pressures and suspected orthostatic syncope at home, I am holding diuresis and his beta-blocker for now.  After last admission his home Lasix was increased from 40 mg twice daily to 60 mg twice daily, may need to back down again. -Holding Lasix and Toprol-XL for now -Consider resuming Lasix at lower dose -Strict I/O's and daily weights  Atypical chest pain:  Reports chest pain across his chest beginning after ambulating however has been consistent since.  High-sensitivity troponin is 19 x 2 and not significantly changed from previous 3 weeks ago.  EKG shows V paced rhythm.  I discussed with on-call cardiology who felt there was low suspicion for ACS given these findings and similar presentation on  prior admission.  Will continue home aspirin 81 mg and atorvastatin.  No further work-up at this point.  Severe COPD/emphysema with chronic respiratory failure: Chronic and appears stable at this point.   -Continue Breztri twice daily and as needed albuterol -Continue supplemental oxygen, 5 L O2 via [place] to maintain O2 saturations between 88-92%  Permanent atrial fibrillation s/p AV nodal ablation S/p bioprosthetic mitral valve replacement with LAA closure: On aspirin alone due to history of ICH when previously on Coumadin.  Currently V paced rhythm. -Continue aspirin 81 mg daily -Toprol-XL on hold for now as above  Type 2 diabetes: Holding home Metformin, placed on sensitive SSI while in hospital.  Hypertension: Holding home Toprol-XL and Lasix due to possible orthostatic hypotension/syncope as above.  Hyperlipidemia: Continue atorvastatin.  History of stage I non-small cell carcinoma left lung: S/p wedge resection.  No acute issue.  Masslike appearance of cecum seen on CT imaging: Discussed finding with patient.  Can consider further nonemergent evaluation with colonoscopy however given his severe cardiac and pulmonary disease and recent involvement with palliative care I do not believe there will be significant benefit at this point.  DVT prophylaxis: SCDs Code Status: DNR, confirmed with patient Family Communication: Discussed with patient, he has discussed with his spouse Disposition Plan: From home, discharge to home versus SNF pending patient preference Consults called: Discussed with on-call cardiology, not formally consulted Admission status:  Status is: Inpatient  Remains inpatient appropriate because:Hemodynamically unstable and Unsafe d/c plan   Dispo: The patient is from: Home              Anticipated d/c is to: Home unless patient willing to go to SNF              Anticipated d/c date is: 2 days              Patient currently is not medically stable to  d/c.  Zada Finders MD [facility]  If 7PM-7AM, please contact night-coverage [email or web address]  12/07/2019, 12:33 AM

## 2019-12-06 NOTE — ED Provider Notes (Signed)
[facility] EMERGENCY DEPARTMENT Provider Note   CSN: [id number] Arrival date & time: 12/06/19  1618     History Chief Complaint  Patient presents with  . Fall  . Shortness of Breath    [name] is a 74 y.o. male.  HPI  74 year old male with a history of COPD, pulmonary hypertension, chronic A. Fib--on aspirin only anticoagulation due to intracranial hemorrhage history, COPD that is severe gold level 2 with rest room air pulse ox baseline of 85%.  Severe CHF with ICD biventricular pacemaker and defibrillator in place due to extremely low EF. Pulmonary hypertension, nonischemic cardiomyopathy, combined systolic diastolic heart failure, history of thoracic aortic aneurysm without rupture, history of MI, DM, heart valve replacement, hepatic cirrhosis, history of stroke with no residual deficits.   Patient was brought in by EMS from home after fall that occurred prior to arrival due to patient falling to the ground.  He states that he had been walking up and down the hallway at the instruction of his nurse aide who visits him every other day when he started having sudden onset chest pain and lightheadedness.  He states it was a crushing chest pain that radiated to his back felt similar to a heart attack that he had in the past.  He denies loss of consciousness and denies any has had initially with triage however he tells me that he fell backwards and banged his head on the ground.  He states that he feels weak, short of breath-but denies any new shortness of breath and states that this is a chronic issue-and states that his chest pain has continued at 8/10 crushing chest pain that radiates to his back.  He denies any nausea or vomiting.  States that he did experience some diaphoresis earlier during the episode as well.     Past Medical History:  Diagnosis Date  . Atrial septal defect    Closed with surgery January, 2010  . Automatic implantable  cardioverter-defibrillator in situ    LV dysfunction and pacer needed for AV node lesion  . Chronic combined systolic and diastolic CHF (congestive heart failure) ([place])   . Colon polyps   . COPD (chronic obstructive pulmonary disease) (HCC)    O2- 2 liters, nasal cannula, q night   . CVA (cerebral vascular accident) ([place]) 2009   denies residual on 08/14/2013  . Diabetes mellitus without complication ([place])    type 2  . Dyslipidemia   . Endocarditis    Bacterial, 2009  . Hypertension   . Intracranial hemorrhage (HCC)    Coumadin cannot be used because of the history of his bleed  . Lung cancer ([place]) 11/29/2013   T1N0 Stage Ia non-small cell carcinoma left lung treated with wedge resection  . Myocardial infarction ([place]) 2010  . Pacemaker    combo pacer and icd  . Permanent atrial fibrillation    Originally Coumadin use for atrial fibrillation  //   he had intracerebral hemorrhage with an INR of 2.3 June, 2009. Anticoagulation could no longer be used.  //  Rapid atrial fibrillation after inferior MI October, 2010..........Marland Kitchen AV node ablation done at that time with ICD pacemaker placed (EF 35%).   //   Left atrial appendage tied off at the time of mitral valve surgery January, 2010 (maze pro  . Pneumonia 07/2018  . Pulmonary hypertension (HCC)    Moderate  . Renal artery stenosis (HCC)    Mild by history  . 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 Spontaneous pneumothorax    right thoracotomy - distant past  . Status post minimally invasive mitral valve replacement with bioprosthetic valve    33 mm Medtronic Mosaic porcine bioprosthesis placed via right mini thoracotomy for bacterial endocarditis complicated by severe MR and CHF   . Thoracic aortic aneurysm ([place]) 08/11/2016   a - Chest CTA 1/18:  Aneurysmal dilatation of aortic root is noted at 5.1 cm.     Patient Active Problem List   Diagnosis Date Noted  . Acute respiratory failure with hypoxia ([place])   . Goals of care,  counseling/discussion   . Palliative care by specialist   . Acute exacerbation of CHF (congestive heart failure) ([place]) 11/13/2019  . Elevated troponin   . Chronic atrial fibrillation ([place])   . Weakness generalized   . DNR (do not resuscitate)   . Acute on chronic combined systolic (congestive) and diastolic (congestive) heart failure ([place]) 04/27/2019  . COPD with acute exacerbation ([place]) 08/01/2018  . Pulmonary edema 07/30/2018  . Health care maintenance 05/09/2018  . Gastritis and gastroduodenitis   . Diverticulum of duodenum   . Aortic root dilatation ([place])   . 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 Atherosclerosis of aorta ([place]) 08/11/2016  . Orthostatic hypotension 09/26/2015  . Near syncope 09/24/2015  . Syncope 09/24/2015  . Hepatic cirrhosis ([place]) 09/25/2014  . History of colonic polyps 09/25/2014  . Serum total bilirubin elevated 06/30/2014  . Follow-up examination, following unspecified surgery 01/27/2014  . Lung cancer ([place]) 11/29/2013  . Prosthetic valve dysfunction   . Chronic combined systolic and diastolic CHF (congestive heart failure) ([place])   . Diabetes ([place]) 11/04/2013  . Myocardial infarction ([place]) 11/01/2013  . Cardiomyopathy, nonischemic ([place]) 11/01/2013  . Acute on chronic respiratory failure with hypoxia ([place]) 08/15/2013  . H/O intracranial hemorrhage 08/13/2013  . H/O endocarditis 08/13/2013  . H/O: CVA (cerebrovascular accident) 08/13/2013  . Community acquired pneumonia 08/13/2013  . Chronic respiratory failure assoc with chf/ PAH 08/12/2013  . H/O atrioventricular nodal ablation   . S/P MVR (mitral valve replacement)   . Hypertension   . Dyslipidemia   . Permanent atrial fibrillation   . Ejection fraction < 50%   . ICD (implantable cardioverter-defibrillator), biventricular, in situ   . Renal artery stenosis ([place])   . Pulmonary hypertension ([place])   . Primary cancer of left upper lobe of lung ([place]) 04/20/2009  . COLONIC POLYPS, ADENOMATOUS  03/22/2007  . COPD GOLD II 01/11/2007  . GERD 01/11/2007    Past Surgical History:  Procedure Laterality Date  . APPENDECTOMY    . ASD REPAIR, SECUNDUM  07/17/2008   pericardial patch closure of ASD  . AV NODE ABLATION  07/2008   for rapid atrial fib  . CARDIAC CATHETERIZATION    . CARDIAC DEFIBRILLATOR PLACEMENT  ~ [address] [facility]  . CARDIAC VALVE REPLACEMENT    . CATARACT EXTRACTION W/ INTRAOCULAR LENS  IMPLANT, BILATERAL Bilateral   . ESOPHAGOGASTRODUODENOSCOPY (EGD) WITH PROPOFOL N/A 09/29/2016   Procedure: ESOPHAGOGASTRODUODENOSCOPY (EGD) WITH PROPOFOL;  Surgeon: [name], MD;  Location: [facility] ENDOSCOPY;  Service: Endoscopy;  Laterality: N/A;  . HERNIA REPAIR    . IMPLANTABLE CARDIOVERTER DEFIBRILLATOR (ICD) GENERATOR CHANGE N/A 02/06/2012   Procedure: ICD GENERATOR CHANGE;  Surgeon: [name], MD;  Location: [facility] CATH LAB;  Service: Cardiovascular;  Laterality: N/A;  . INSERT / REPLACE / REMOVE PACEMAKER    . MASS BIOPSY Left    neck mass  .  MITRAL VALVE REPLACEMENT Right 07/17/2008   64mm Medtronic Mosaic porcine bioprosthesis  . PENILE PROSTHESIS IMPLANT    . RIGHT HEART CATHETERIZATION N/A 08/16/2013   Procedure: RIGHT HEART CATH;  Surgeon: Josue Hector, MD;  Location: [facility] CATH LAB;  Service: Cardiovascular;  Laterality: N/A;  . TEE WITHOUT CARDIOVERSION N/A 09/06/2016   Procedure: TRANSESOPHAGEAL ECHOCARDIOGRAM (TEE);  Surgeon: Dorothy Spark, MD;  Location[facility] ENDOSCOPY;  Service: Cardiovascular;  Laterality: N/A;  . THORACOTOMY Right 1970's   spontaneous pneumothorax - while in the [place]  . TOE SURGERY     left foot hammer toe  . TONSILLECTOMY    . VIDEO ASSISTED THORACOSCOPY (VATS)/WEDGE RESECTION Left 11/29/2013   Procedure: Video assisted thoracoscopy for wedge resection; mini thoracotomy;  Surgeon: [name], MD;  Location: [facility] OR;  Service: Thoracic;  Laterality: Left;       Family History  Problem Relation Age of Onset  . Stomach cancer Father     . Stroke Mother     Social History   Tobacco Use  . Smoking status: Former Smoker    Packs/day: 1.00    Years: 45.00    Pack years: 45.00    Types: Cigarettes    Quit date: 2011    Years since quitting: 10.4  . Smokeless tobacco: Never Used  Substance Use Topics  . Alcohol use: No    Alcohol/week: 0.0 standard drinks    Comment: 08/14/2013 "used to drink beer; quit:in 1982"  . Drug use: No    Home Medications Prior to Admission medications   Medication Sig Start Date End Date Taking? Authorizing Provider  acetaminophen (TYLENOL) 500 MG tablet Take 2 tablets (1,000 mg total) by mouth every 8 (eight) hours. 11/25/19 01/24/20  Mercy Riding, MD  aspirin EC 81 MG tablet Take 1 tablet (81 mg total) by mouth daily. 12/07/12   Carlena Bjornstad, MD  atorvastatin (LIPITOR) 10 MG tablet Take 10 mg by mouth daily.    [provider]  bethanechol (URECHOLINE) 10 MG tablet Take 1 tablet (10 mg total) by mouth 3 (three) times daily. 12/03/19   Nafziger, Tommi Rumps, NP  Budeson-Glycopyrrol-Formoterol (BREZTRI AEROSPHERE) 160-9-4.8 MCG/ACT AERO Inhale 2 puffs into the lungs in the morning and at bedtime. 11/08/19   Martyn Ehrich, NP  furosemide (LASIX) 20 MG tablet Take 3 tablets (60 mg total) by mouth 2 (two) times daily. 12/03/19   Nafziger, Tommi Rumps, NP  metFORMIN (GLUCOPHAGE) 1000 MG tablet TAKE 1 TABLET BY MOUTH TWICE A DAY WITH MEALS 12/05/19   Nafziger, Tommi Rumps, NP  metoprolol succinate (TOPROL-XL) 25 MG 24 hr tablet Take 0.5 tablets (12.5 mg total) by mouth daily. 11/26/19   [name], MD  nitroGLYCERIN (NITROSTAT) 0.4 MG SL tablet Place 0.4 mg under the tongue every 5 (five) minutes x 3 doses as needed for chest pain.     [provider]  potassium chloride SA (KLOR-CON) 20 MEQ tablet Take 2 tablets (40 mEq total) by mouth daily. 08/12/19 11/13/19  Nafziger, Tommi Rumps, NP  tamsulosin (FLOMAX) 0.4 MG CAPS capsule Take 1 capsule (0.4 mg total) by mouth daily after supper. 12/03/19   Nafziger,  Tommi Rumps, NP  vitamin B-12 1000 MCG tablet Take 1 tablet (1,000 mcg total) by mouth daily. 11/25/19   Mercy Riding, MD    Allergies    Anticoagulant compound, Other, and Warfarin sodium  Review of Systems   Review of Systems  Constitutional: Positive for fatigue. Negative for chills and fever.  HENT: Negative for congestion.   Eyes: Negative for pain.  Respiratory: Negative for cough and shortness of breath.   Cardiovascular: Negative for chest pain and leg swelling.  Gastrointestinal: Negative for abdominal pain and vomiting.  Genitourinary: Negative for dysuria.  Musculoskeletal: Negative for myalgias.  Skin: Negative for rash.  Neurological: Positive for light-headedness. Negative for dizziness and headaches.    Physical Exam Updated Vital Signs BP 110/62 (BP Location: Right Arm)   Pulse 72   Temp (!) 97.2 F (36.2 C) (Axillary)   Resp 16   SpO2 97%   Physical Exam Vitals and nursing note reviewed.  Constitutional:      General: He is not in acute distress.    Comments: Extremely thin, chronically ill appearing 74 year old male.  In no acute distress.  Pleasant, able answer questions appropriately follow commands.  Severely hard of hearing.  HENT:     Head: Normocephalic and atraumatic.     Nose: Nose normal.  Eyes:     General: No scleral icterus. Cardiovascular:     Rate and Rhythm: Normal rate and regular rhythm.     Pulses: Normal pulses.     Heart sounds: Normal heart sounds.  Pulmonary:     Effort: Pulmonary effort is normal. No respiratory distress.     Breath sounds: No wheezing.     Comments: No anterior wall tenderness palpation of chest.  There is biventricular pacemaker/defibrillator in right side of chest wall. Abdominal:     Palpations: Abdomen is soft.     Tenderness: There is no abdominal tenderness. There is no guarding or rebound.  Musculoskeletal:     Cervical back: Normal range of motion.     Right lower leg: No edema.     Left lower leg: No  edema.     Comments: No lower extremity swelling or calf tenderness.  Skin:    General: Skin is warm and dry.     Capillary Refill: Capillary refill takes less than 2 seconds.  Neurological:     Mental Status: He is alert. Mental status is at baseline.  Psychiatric:        Mood and Affect: Mood normal.        Behavior: Behavior normal.     ED Results / Procedures / Treatments   Labs (all labs ordered are listed, but only abnormal results are displayed) Labs Reviewed  BASIC METABOLIC PANEL - Abnormal; Notable for the following components:      Result Value   Glucose, Bld 120 (*)    Calcium 8.6 (*)    All other components within normal limits  CBC - Abnormal; Notable for the following components:   Hemoglobin 10.2 (*)    HCT 36.6 (*)    MCH 23.8 (*)    MCHC 27.9 (*)    RDW 27.4 (*)    All other components within normal limits  TROPONIN I (HIGH SENSITIVITY) - Abnormal; Notable for the following components:   Troponin I (High Sensitivity) 19 (*)    All other components within normal limits  SARS CORONAVIRUS 2 BY RT PCR (HOSPITAL ORDER, [facility])  MAGNESIUM  CBG MONITORING, ED  TROPONIN I (HIGH SENSITIVITY)    EKG EKG Interpretation  Date/Time:  Friday Dec 06 2019 16:26:33 EDT Ventricular Rate:  73 PR Interval:    QRS Duration: 147 QT Interval:  448 QTC Calculation: 494 R Axis:   -115 Text Interpretation: Ventricular-paced rhythm No further analysis attempted due to  paced rhythm No significant change since last tracing Confirmed by Deno Etienne [phone]) on 12/06/2019 5:52:57 PM   Radiology DG Chest 2 View  Result Date: 12/06/2019 CLINICAL DATA:  Chest pain and shortness of breath. Previous myocardial infarct congestive heart failure. EXAM: CHEST - 2 VIEW COMPARISON:  11/17/2019 FINDINGS: Moderate cardiac enlargement shows no significant change. Transvenous pacemaker remains in appropriate position. Pulmonary emphysema again noted. Increased  atelectasis or consolidation is seen in the retrocardiac left lower lobe since prior study. Small bilateral pleural effusions show no significant change. Diffuse interstitial infiltrates are also stable, consistent with diffuse interstitial edema. IMPRESSION: Increased left basilar atelectasis versus consolidation. Stable cardiomegaly, diffuse interstitial infiltrates, and small bilateral pleural effusions, consistent with congestive heart failure. Electronically Signed   By: Marlaine Hind M.D.   On: 12/06/2019 18:55   DG Thoracic Spine 2 View  Result Date: 12/06/2019 CLINICAL DATA:  Fall.  Thoracic back pain.  Initial encounter. EXAM: THORACIC SPINE 2 VIEWS COMPARISON:  None. FINDINGS: There is no evidence of thoracic spine fracture. Alignment is normal. No focal lytic or sclerotic bone lesions identified. Mild degenerative disc disease seen in the mid and lower thoracic spine, and cervical spine. Generalized osteopenia also noted. IMPRESSION: No acute findings. Degenerative spondylosis, as described above.  Osteopenia. Electronically Signed   By: Marlaine Hind M.D.   On: 12/06/2019 19:40   DG Lumbar Spine Complete  Result Date: 12/06/2019 CLINICAL DATA:  73 year old male with fall and back pain. EXAM: LUMBAR SPINE - COMPLETE 4+ VIEW COMPARISON:  Lumbar spine radiograph dated 11/16/2019. FINDINGS: Five lumbar type vertebra. There is no acute fracture or subluxation of the lumbar spine. Multilevel mild chronic compression injury most prominent involving L1 and L4. Multilevel degenerative changes with bone spurring. Multilevel disc desiccation and vacuum phenomena. Grade 1 L3-L4 retrolisthesis and L4-L5 anterolisthesis. Atherosclerotic calcification of the abdominal aorta. IMPRESSION: No acute/traumatic lumbar spine pathology. Electronically Signed   By: Anner Crete M.D.   On: 12/06/2019 19:39   CT Head Wo Contrast  Result Date: 12/06/2019 CLINICAL DATA:  74 year old male with head trauma. EXAM: CT  HEAD WITHOUT CONTRAST CT CERVICAL SPINE WITHOUT CONTRAST TECHNIQUE: Multidetector CT imaging of the head and cervical spine was performed following the standard protocol without intravenous contrast. Multiplanar CT image reconstructions of the cervical spine were also generated. COMPARISON:  Head CT dated 09/24/2015. FINDINGS: CT HEAD FINDINGS Brain: Moderate age-related atrophy and chronic microvascular ischemic changes. 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 There is no acute intracranial hemorrhage. No mass effect midline shift. No extra-axial fluid collection. Vascular: No hyperdense vessel or unexpected calcification. Skull: Normal. Negative for fracture or focal lesion. Sinuses/Orbits: No acute finding. Other: None CT CERVICAL SPINE FINDINGS Alignment: No acute subluxation. Skull base and vertebrae: No acute fracture. Osteopenia. Soft tissues and spinal canal: No prevertebral fluid or swelling. No visible canal hematoma. Disc levels:  Multilevel degenerative changes. Upper chest: Emphysema. Partially visualized moderate to large left pleural effusion or hemothorax. Lobulated right pleural effusion or thickening. Increase in the size of the pleural effusions compared to the CT of 11/07/2019. Other: Fracture of the left clavicular head, acute or possibly subacute new since the prior CT of 11/07/2019. IMPRESSION: 1. No acute intracranial hemorrhage. 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 2.  acute/traumatic cervical spine pathology. 3. Fracture of the left clavicular head, acute or possibly subacute new since the prior CT of 11/07/2019. 4. Partially visualized moderate to large left pleural effusion or hemothorax. Increase in  the size of the pleural effusions compared to the CT of 11/07/2019. Electronically Signed   By: Anner Crete M.D.   On: 12/06/2019 20:02   CT Cervical Spine Wo Contrast  Result Date: 12/06/2019 CLINICAL DATA:  74 year old male with head  trauma. EXAM: CT HEAD WITHOUT CONTRAST CT CERVICAL SPINE WITHOUT CONTRAST TECHNIQUE: Multidetector CT imaging of the head and cervical spine was performed following the standard protocol without intravenous contrast. Multiplanar CT image reconstructions of the cervical spine were also generated. COMPARISON:  Head CT dated 09/24/2015. FINDINGS: CT HEAD FINDINGS Brain: Moderate age-related atrophy and chronic microvascular ischemic changes. 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 There is no acute intracranial hemorrhage. No mass effect midline shift. No extra-axial fluid collection. Vascular: No hyperdense vessel or unexpected calcification. Skull: Normal. Negative for fracture or focal lesion. Sinuses/Orbits: No acute finding. Other: None CT CERVICAL SPINE FINDINGS Alignment: No acute subluxation. Skull base and vertebrae: No acute fracture. Osteopenia. Soft tissues and spinal canal: No prevertebral fluid or swelling. No visible canal hematoma. Disc levels:  Multilevel degenerative changes. Upper chest: Emphysema. Partially visualized moderate to large left pleural effusion or hemothorax. Lobulated right pleural effusion or thickening. Increase in the size of the pleural effusions compared to the CT of 11/07/2019. Other: Fracture of the left clavicular head, acute or possibly subacute new since the prior CT of 11/07/2019. IMPRESSION: 1. No acute intracranial hemorrhage. 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 2.  acute/traumatic cervical spine pathology. 3. Fracture of the left clavicular head, acute or possibly subacute new since the prior CT of 11/07/2019. 4. Partially visualized moderate to large left pleural effusion or hemothorax. Increase in the size of the pleural effusions compared to the CT of 11/07/2019. Electronically Signed   By: Anner Crete M.D.   On: 12/06/2019 20:02   CT Angio Chest/Abd/Pel for Dissection W and/or Wo Contrast  Result Date:  12/06/2019 CLINICAL DATA:  Chest pain.  Concern for aortic dissection. EXAM: CT ANGIOGRAPHY CHEST, ABDOMEN AND PELVIS TECHNIQUE: Non-contrast CT of the chest was initially obtained. Multidetector CT imaging through the chest, abdomen and pelvis was performed using the standard protocol during bolus administration of intravenous contrast. Multiplanar reconstructed images and MIPs were obtained and reviewed to evaluate the vascular anatomy. CONTRAST:  160mL OMNIPAQUE IOHEXOL 350 MG/ML SOLN COMPARISON:  11/07/2019 FINDINGS: CTA CHEST FINDINGS Cardiovascular: There is significant cardiac enlargement. The main pulmonary artery is dilated without evidence for an acute pulmonary embolism. There is no evidence for thoracic aortic dissection. Coronary artery calcifications are noted. The arch vessels are grossly patent. 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 This is essentially unchanged from prior study dated 11/07/2019. Mediastinum/Nodes: --there is mild mediastinal adenopathy. This is essentially stable from prior study. --No axillary lymphadenopathy. --No supraclavicular lymphadenopathy. --Normal thyroid gland. --The esophagus is unremarkable Lungs/Pleura: There are moderate severe emphysematous changes bilaterally. There is interlobular septal thickening. There are moderate-sized bilateral pleural effusions, left greater than right. The right-sided pleural effusion appears to be at least partially loculated. There is bronchial wall thickening and mild mucous plugging at the lung bases bilaterally. There are postsurgical changes in the left upper lobe likely related to prior wedge resection. Musculoskeletal: No chest wall abnormality. No acute or significant osseous findings. Review of the MIP images confirms the above findings. CTA ABDOMEN AND PELVIS FINDINGS VASCULAR Aorta: Normal caliber aorta without aneurysm, dissection, vasculitis or significant  stenosis. Celiac: There is a  mild loop shaped curvature of the celiac axis with associated narrowing. This can be seen in patients with median arcuate ligament syndrome. SMA: There is variant hepatic arterial anatomy with a replaced common hepatic artery arising from the SMA. Renals: Multiple bilateral renal arteries are noted, all of which appear to be grossly patent. IMA: Patent without evidence of aneurysm, dissection, vasculitis or significant stenosis. Inflow: Patent without evidence of aneurysm, dissection, vasculitis or significant stenosis. Veins: No obvious venous abnormality within the limitations of this arterial phase study. Review of the MIP images confirms the above findings. NON-VASCULAR Hepatobiliary: The liver appears cirrhotic. Normal gallbladder.There is no biliary ductal dilation. Pancreas: Normal contours without ductal dilatation. No peripancreatic fluid collection. Spleen: Unremarkable. Adrenals/Urinary Tract: --Adrenal glands: Unremarkable. --Right kidney/ureter: No hydronephrosis or radiopaque kidney stones. --Left kidney/ureter: No hydronephrosis or radiopaque kidney stones. --Urinary bladder: The urinary bladder is significantly distended. Stomach/Bowel: --Stomach/Duodenum: No hiatal hernia or other gastric abnormality. Normal duodenal course and caliber. --Small bowel: Unremarkable. --Colon: There is an masslike appearance of the cecum with multiple small mildly enlarged regional lymph nodes. --Appendix: Normal. Lymphatic: --No retroperitoneal lymphadenopathy. --No mesenteric lymphadenopathy. --No pelvic or inguinal lymphadenopathy. Reproductive: Prostate gland is enlarged. Other: There is a mild amount presacral free fluid. There is no free air. There is mild body wall edema. Musculoskeletal. Old compression fractures are noted throughout the lumbar spine with multilevel degenerative changes. These are essentially stable from prior study. Review of the MIP images confirms the above  findings. IMPRESSION: 1. No evidence for an acute vascular abnormality. There is no evidence for a thoracic aortic dissection. No evidence for an acute pulmonary embolism. 2. 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 3. Profound cardiomegaly with findings of volume overload including small bilateral pleural effusions and body wall edema. The right-sided pleural effusion appears to be at least partially loculated. 4. Findings are concerning for a cecal mass as detailed above. Follow-up with nonemergent colonoscopy is recommended. 5. Significantly distended urinary bladder. 6. Findings suspicious for underlying cirrhosis. 7. Relatively stable mediastinal and hilar adenopathy of unknown clinical significance. 8. Severe emphysematous changes with findings consistent with infectious or reactive bronchiolitis at the lung bases. Aortic Atherosclerosis (ICD10-I70.0) and Emphysema (ICD10-J43.9). Electronically Signed   By: Constance Holster M.D.   On: 12/06/2019 20:08    Procedures .Critical Care Performed by: Tedd Sias, PA Authorized by: Tedd Sias, PA   Critical care provider statement:    Critical care time (minutes):  35   Critical care time was exclusive of:  Separately billable procedures and treating other patients and teaching time   Critical care was necessary to treat or prevent imminent or life-threatening deterioration of the following conditions: Chest pain, high risk patient, exertional syncope.   Critical care was time spent personally by me on the following activities:  Discussions with consultants, evaluation of patient's response to treatment, examination of patient, review of old charts, re-evaluation of patient's condition, pulse oximetry, ordering and review of radiographic studies, ordering and review of laboratory studies and ordering and performing treatments and interventions   I assumed direction of critical care for this  patient from another provider in my specialty: no     (including critical care time)  Medications Ordered in ED Medications  iohexol (OMNIPAQUE) 350 MG/ML injection 100 mL (100 mLs Intravenous Contrast Given 12/06/19 1921)    ED Course  I have reviewed the triage vital signs and the nursing notes.  Pertinent labs & imaging results  that were available during my care of the patient were reviewed by me and considered in my medical decision making (see chart for details).  Patient 74 year old male with extensive past medical history detailed above presented today for near syncopal episode that occurred with exertion today with associated severe crushing chest pain and fall.  CT head, C-spine, CT angio chest abdomen pelvis, thoracic and lumbar x-rays were obtained.  Lab work, delta troponins, EKG, chest x-ray.  Initial concern for thoracic artery dissection given patient's description and history.  Clinical Course as of Dec 06 2243  Fri Dec 06, 2019  2114 CT head / C spine IMPRESSION: 1. No acute intracranial hemorrhage. 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 2. acute/traumatic cervical spine pathology. 3. Fracture of the left clavicular head, acute or possibly subacute new since the prior CT of 11/07/2019. 4. Partially visualized moderate to large left pleural effusion or hemothorax. Increase in the size of the pleural effusions compared to the CT of 11/07/2019.   [WF]  2114 CT angio chest/abd/pelvis IMPRESSION: 1. No evidence for an acute vascular abnormality. There is no evidence for a thoracic aortic dissection. No evidence for an acute pulmonary embolism. 2. 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 3. Profound cardiomegaly with findings of volume overload including small bilateral pleural effusions and body wall edema. The right-sided pleural effusion appears to be at least  partially loculated. 4. Findings are concerning for a cecal mass as detailed above. Follow-up with nonemergent colonoscopy is recommended. 5. Significantly distended urinary bladder. 6. Findings suspicious for underlying cirrhosis. 7. Relatively stable mediastinal and hilar adenopathy of unknown clinical significance. 8. Severe emphysematous changes with findings consistent with infectious or reactive bronchiolitis at the lung bases.   [WF]  2209 DG thoracic spineIMPRESSION: No acute findings.  Degenerative spondylosis, as described above. Osteopenia.     [WF]  2209 DG lumbar wtihout abnormality    [WF]    Clinical Course User Index [WF] Tedd Sias, [place]   Imaging negative for thoracic aortic dissection.  No evident pulmonary embolisms.  Doubt [place].  Suspicion for ACS VS arrhythmia.  Patient is high risk for both.  I discussed this case with my attending physician who cosigned this note including patient's presenting symptoms, physical exam, and planned diagnostics and interventions. Attending physician stated agreement with plan or made changes to plan which were implemented.   Attending physician assessed patient at bedside.   Admit to hospitalist service for chest pain rule out and further evaluation.  MDM Rules/Calculators/A&P                      10:30 PM discussed with hospitalist who will admit patient for chest pain rule out.  Requested cardiology consultation.  Cardiology consulted.  Dr. Tyrone Nine will discuss with them as it is end of shift.   Final Clinical Impression(s) / ED Diagnoses Final diagnoses:  Chest pain, unspecified type  Near syncope    Rx / DC Orders ED Discharge Orders    None       Tedd Sias, [place] 12/06/19 [place], [place], DO 12/06/19 2329

## 2019-12-07 DIAGNOSIS — Z7189 Other specified counseling: Secondary | ICD-10-CM

## 2019-12-07 DIAGNOSIS — Z66 Do not resuscitate: Secondary | ICD-10-CM

## 2019-12-07 DIAGNOSIS — I4821 Permanent atrial fibrillation: Secondary | ICD-10-CM

## 2019-12-07 DIAGNOSIS — I10 Essential (primary) hypertension: Secondary | ICD-10-CM

## 2019-12-07 DIAGNOSIS — J9621 Acute and chronic respiratory failure with hypoxia: Secondary | ICD-10-CM

## 2019-12-07 DIAGNOSIS — E1159 Type 2 diabetes mellitus with other circulatory complications: Secondary | ICD-10-CM

## 2019-12-07 DIAGNOSIS — F339 Major depressive disorder, recurrent, unspecified: Secondary | ICD-10-CM

## 2019-12-07 DIAGNOSIS — J449 Chronic obstructive pulmonary disease, unspecified: Secondary | ICD-10-CM

## 2019-12-07 DIAGNOSIS — E785 Hyperlipidemia, unspecified: Secondary | ICD-10-CM

## 2019-12-07 DIAGNOSIS — E119 Type 2 diabetes mellitus without complications: Secondary | ICD-10-CM

## 2019-12-07 DIAGNOSIS — Z95810 Presence of automatic (implantable) cardiac defibrillator: Secondary | ICD-10-CM

## 2019-12-07 DIAGNOSIS — E1169 Type 2 diabetes mellitus with other specified complication: Secondary | ICD-10-CM

## 2019-12-07 DIAGNOSIS — Z515 Encounter for palliative care: Secondary | ICD-10-CM

## 2019-12-07 DIAGNOSIS — I5042 Chronic combined systolic (congestive) and diastolic (congestive) heart failure: Secondary | ICD-10-CM

## 2019-12-07 LAB — CBC
HCT: 37.5 % — ABNORMAL LOW (ref 39.0–52.0)
Hemoglobin: 10.3 g/dL — ABNORMAL LOW (ref 13.0–17.0)
MCH: 23.3 pg — ABNORMAL LOW (ref 26.0–34.0)
MCHC: 27.5 g/dL — ABNORMAL LOW (ref 30.0–36.0)
MCV: 84.7 fL (ref 80.0–100.0)
Platelets: 305 10*3/uL (ref 150–400)
RBC: 4.43 MIL/uL (ref 4.22–5.81)
RDW: 27.3 % — ABNORMAL HIGH (ref 11.5–15.5)
WBC: 6.2 10*3/uL (ref 4.0–10.5)
nRBC: 0 % (ref 0.0–0.2)

## 2019-12-07 LAB — BASIC METABOLIC PANEL
Anion gap: 11 (ref 5–15)
BUN: 20 mg/dL (ref 8–23)
CO2: 30 mmol/L (ref 22–32)
Calcium: 8.5 mg/dL — ABNORMAL LOW (ref 8.9–10.3)
Chloride: 99 mmol/L (ref 98–111)
Creatinine, Ser: 1.1 mg/dL (ref 0.61–1.24)
GFR calc Af Amer: >60 mL/min (ref >60)
GFR calc non Af Amer: >60 mL/min (ref >60)
Glucose, Bld: 173 mg/dL — ABNORMAL HIGH (ref 70–99)
Potassium: 3.7 mmol/L (ref 3.5–5.1)
Sodium: 140 mmol/L (ref 135–145)

## 2019-12-07 LAB — GLUCOSE, CAPILLARY
Glucose-Capillary: 120 mg/dL — ABNORMAL HIGH (ref 70–99)
Glucose-Capillary: 163 mg/dL — ABNORMAL HIGH (ref 70–99)
Glucose-Capillary: 164 mg/dL — ABNORMAL HIGH (ref 70–99)

## 2019-12-07 LAB — BRAIN NATRIURETIC PEPTIDE: B Natriuretic Peptide: 1365.1 pg/mL — ABNORMAL HIGH (ref 0.0–100.0)

## 2019-12-07 MED ORDER — PANTOPRAZOLE SODIUM 40 MG PO TBEC
40.0000 mg | DELAYED_RELEASE_TABLET | Freq: Every day | ORAL | Status: DC
  Administered 2019-12-07 – 2019-12-12 (×6): 40 mg via ORAL
  Filled 2019-12-07 (×6): qty 1

## 2019-12-07 MED ORDER — CITALOPRAM HYDROBROMIDE 10 MG PO TABS
10.0000 mg | ORAL_TABLET | Freq: Every day | ORAL | Status: DC
  Administered 2019-12-08 – 2019-12-12 (×5): 10 mg via ORAL
  Filled 2019-12-07 (×5): qty 1

## 2019-12-07 MED ORDER — FUROSEMIDE 10 MG/ML IJ SOLN
80.0000 mg | Freq: Two times a day (BID) | INTRAMUSCULAR | Status: DC
  Administered 2019-12-07 – 2019-12-11 (×9): 80 mg via INTRAVENOUS
  Filled 2019-12-07 (×9): qty 8

## 2019-12-07 MED ORDER — INSULIN ASPART 100 UNIT/ML ~~LOC~~ SOLN
0.0000 [IU] | Freq: Three times a day (TID) | SUBCUTANEOUS | Status: DC
  Administered 2019-12-07 (×2): 2 [IU] via SUBCUTANEOUS
  Administered 2019-12-08 (×2): 1 [IU] via SUBCUTANEOUS
  Administered 2019-12-08: 2 [IU] via SUBCUTANEOUS
  Administered 2019-12-09 (×2): 1 [IU] via SUBCUTANEOUS
  Administered 2019-12-09: 2 [IU] via SUBCUTANEOUS
  Administered 2019-12-10 (×2): 1 [IU] via SUBCUTANEOUS
  Administered 2019-12-10 – 2019-12-12 (×5): 2 [IU] via SUBCUTANEOUS
  Administered 2019-12-12: 3 [IU] via SUBCUTANEOUS
  Administered 2019-12-12: 2 [IU] via SUBCUTANEOUS

## 2019-12-07 MED ORDER — ARFORMOTEROL TARTRATE 15 MCG/2ML IN NEBU
15.0000 ug | INHALATION_SOLUTION | Freq: Two times a day (BID) | RESPIRATORY_TRACT | Status: DC
  Administered 2019-12-07 – 2019-12-08 (×4): 15 ug via RESPIRATORY_TRACT
  Filled 2019-12-07 (×4): qty 2

## 2019-12-07 MED ORDER — UMECLIDINIUM BROMIDE 62.5 MCG/INH IN AEPB
1.0000 | INHALATION_SPRAY | Freq: Two times a day (BID) | RESPIRATORY_TRACT | Status: DC
  Administered 2019-12-09: 1 via RESPIRATORY_TRACT
  Filled 2019-12-07: qty 7

## 2019-12-07 NOTE — Consult Note (Signed)
Cardiology Consultation:  Patient ID: [name] MRN: [id number]; DOB: [birth year]  Admit date: 12/06/2019 Date of Consult: 12/07/2019  Primary Care Provider: [name], NP Primary Cardiologist: Candee Furbish, MD  Primary Electrophysiologist:  Cristopher Peru, MD   Patient Profile:  [name] is a 74 y.o. male with a hx of congestive heart failure, severe COPD, permanent atrial fibrillation status post AV nodal ablation, severe are status post mitral valve replacement who is being seen today for the evaluation of fall at the request of Irine Seal, MD.  Problem List 1. CHF, EF 25% -s/p BiV ICD 2. Severe COPD 3. Permanent Afib -s/p AVN ablation  -no AC -LAA clip 4. Severe MR -s/p MV replacement with prosthetic valve  5. PHTN 6. Frailty/Falls  History of Present Illness:  [name] presents with fall after leaving the bathroom. Reports yesterday he was coming out of the bathroom and got dizzy. He reports he fell on the ground. No LOC.  I did confirm this with he and his wife.  He reports has had intermittent sharp chest pain for the past 2 to 3 days.  His EKG showed a paced rhythm and troponins are negative.  He reports he is not had any more lightheadedness or dizziness.  Apparently that was the main symptom when he came out of the bathroom yesterday.  Again no loss of consciousness was reported.  [name] was just admitted to the hospital after a fall.  He had a heart failure exacerbation.  He was diuresed and then discharged home.  Apparently he did refuse SNF placement.  I did discuss with his wife that they are unable to take care of him at home.  He is profoundly short of breath with exertion.  He has to wear 5 L of oxygen 24/7.  She reports that she cannot care for him.  She reports he has been falling frequently.  He has been unable to tolerate heart failure medications due to hypotension.  He is not a candidate for advanced therapies given his severe COPD.  I did approach  both of them about palliative and hospice care.  They are likely more interested now.  It also may be prudent to discuss SNF placement.  On arrival to the emergency room he was noted to have soft blood pressures 91/58.  Heart rates were in the 70s.  Lab work demonstrates stable kidney function creatinine 1.10 high-sensitivity troponins are flat 19 and 19 on repeat.  BNP elevated around 1300.  CBC demonstrates stable anemia, hemoglobin 10.3.  He did undergo a CT a for dissection protocol.  He does not have an aortic dissection.  He has a stable sinus of Valsalva aneurysm.  He has severe COPD and likely underlying cirrhosis on that scan.  There is also a possible cecal mass.  Heart Pathway Score:       Past Medical History: Past Medical History:  Diagnosis Date  . Atrial septal defect    Closed with surgery January, 2010  . Automatic implantable cardioverter-defibrillator in situ    LV dysfunction and pacer needed for AV node lesion  . Chronic combined systolic and diastolic CHF (congestive heart failure) ([place])   . Colon polyps   . COPD (chronic obstructive pulmonary disease) (HCC)    O2- 2 liters, nasal cannula, q night   . CVA (cerebral vascular accident) ([place]) 2009   denies residual on 08/14/2013  . Diabetes mellitus without complication ([place])    type 2  . Dyslipidemia   .  Endocarditis    Bacterial, 2009  . Hypertension   . Intracranial hemorrhage (HCC)    Coumadin cannot be used because of the history of his bleed  . Lung cancer ([place]) 11/29/2013   T1N0 Stage Ia non-small cell carcinoma left lung treated with wedge resection  . Myocardial infarction ([place]) 2010  . Pacemaker    combo pacer and icd  . Permanent atrial fibrillation    Originally Coumadin use for atrial fibrillation  //   he had intracerebral hemorrhage with an INR of 2.3 June, 2009. Anticoagulation could no longer be used.  //  Rapid atrial fibrillation after inferior MI October, 2010..........Marland Kitchen AV node ablation done at  that time with ICD pacemaker placed (EF 35%).   //   Left atrial appendage tied off at the time of mitral valve surgery January, 2010 (maze pro  . Pneumonia 07/2018  . Pulmonary hypertension (HCC)    Moderate  . Renal artery stenosis (HCC)    Mild by history  . 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 Spontaneous pneumothorax    right thoracotomy - distant past  . Status post minimally invasive mitral valve replacement with bioprosthetic valve    33 mm Medtronic Mosaic porcine bioprosthesis placed via right mini thoracotomy for bacterial endocarditis complicated by severe MR and CHF   . Thoracic aortic aneurysm ([place]) 08/11/2016   a - Chest CTA 1/18:  Aneurysmal dilatation of aortic root is noted at 5.1 cm.     Past Surgical History: Past Surgical History:  Procedure Laterality Date  . APPENDECTOMY    . ASD REPAIR, SECUNDUM  07/17/2008   pericardial patch closure of ASD  . AV NODE ABLATION  07/2008   for rapid atrial fib  . CARDIAC CATHETERIZATION    . CARDIAC DEFIBRILLATOR PLACEMENT  ~ [address] [place]  . CARDIAC VALVE REPLACEMENT    . CATARACT EXTRACTION W/ INTRAOCULAR LENS  IMPLANT, BILATERAL Bilateral   . ESOPHAGOGASTRODUODENOSCOPY (EGD) WITH PROPOFOL N/A 09/29/2016   Procedure: ESOPHAGOGASTRODUODENOSCOPY (EGD) WITH PROPOFOL;  Surgeon: [name], MD;  Location: [facility] ENDOSCOPY;  Service: Endoscopy;  Laterality: N/A;  . HERNIA REPAIR    . IMPLANTABLE CARDIOVERTER DEFIBRILLATOR (ICD) GENERATOR CHANGE N/A 02/06/2012   Procedure: ICD GENERATOR CHANGE;  Surgeon: [name], MD;  Location: [facility] CATH LAB;  Service: Cardiovascular;  Laterality: N/A;  . INSERT / REPLACE / REMOVE PACEMAKER    . MASS BIOPSY Left    neck mass  . MITRAL VALVE REPLACEMENT Right 07/17/2008   54mm Medtronic Mosaic porcine bioprosthesis  . PENILE PROSTHESIS IMPLANT    . RIGHT HEART CATHETERIZATION N/A 08/16/2013   Procedure: RIGHT HEART CATH;  Surgeon: Josue Hector, MD;  Location: [facility] CATH LAB;  Service:  Cardiovascular;  Laterality: N/A;  . TEE WITHOUT CARDIOVERSION N/A 09/06/2016   Procedure: TRANSESOPHAGEAL ECHOCARDIOGRAM (TEE);  Surgeon: Dorothy Spark, MD;  Location: [facility] ENDOSCOPY;  Service: Cardiovascular;  Laterality: N/A;  . THORACOTOMY Right 1970's   spontaneous pneumothorax - while in the [place]  . TOE SURGERY     left foot hammer toe  . TONSILLECTOMY    . VIDEO ASSISTED THORACOSCOPY (VATS)/WEDGE RESECTION Left 11/29/2013   Procedure: Video assisted thoracoscopy for wedge resection; mini thoracotomy;  Surgeon: [name], MD;  Location: [facility];  Service: Thoracic;  Laterality: Left;     Home Medications:  Prior to Admission medications   Medication Sig Start Date End Date Taking? Authorizing Provider  acetaminophen (TYLENOL) 500 MG tablet  Take 2 tablets (1,000 mg total) by mouth every 8 (eight) hours. 11/25/19 01/24/20  Mercy Riding, MD  aspirin EC 81 MG tablet Take 1 tablet (81 mg total) by mouth daily. 12/07/12   Carlena Bjornstad, MD  atorvastatin (LIPITOR) 10 MG tablet Take 10 mg by mouth daily.    [provider]  bethanechol (URECHOLINE) 10 MG tablet Take 1 tablet (10 mg total) by mouth 3 (three) times daily. 12/03/19   Nafziger, Tommi Rumps, NP  Budeson-Glycopyrrol-Formoterol (BREZTRI AEROSPHERE) 160-9-4.8 MCG/ACT AERO Inhale 2 puffs into the lungs in the morning and at bedtime. 11/08/19   Martyn Ehrich, NP  furosemide (LASIX) 20 MG tablet Take 3 tablets (60 mg total) by mouth 2 (two) times daily. 12/03/19   Nafziger, Tommi Rumps, NP  metFORMIN (GLUCOPHAGE) 1000 MG tablet TAKE 1 TABLET BY MOUTH TWICE A DAY WITH MEALS 12/05/19   Nafziger, Tommi Rumps, NP  metoprolol succinate (TOPROL-XL) 25 MG 24 hr tablet Take 0.5 tablets (12.5 mg total) by mouth daily. 11/26/19   [name], MD  nitroGLYCERIN (NITROSTAT) 0.4 MG SL tablet Place 0.4 mg under the tongue every 5 (five) minutes x 3 doses as needed for chest pain.     [provider]  potassium chloride SA (KLOR-CON) 20 MEQ  tablet Take 2 tablets (40 mEq total) by mouth daily. 08/12/19 11/13/19  Nafziger, Tommi Rumps, NP  tamsulosin (FLOMAX) 0.4 MG CAPS capsule Take 1 capsule (0.4 mg total) by mouth daily after supper. 12/03/19   Nafziger, Tommi Rumps, NP  vitamin B-12 1000 MCG tablet Take 1 tablet (1,000 mcg total) by mouth daily. 11/25/19   Mercy Riding, MD    Inpatient Medications: Scheduled Meds: . arformoterol  15 mcg Nebulization BID   Or  . umeclidinium bromide  1 puff Inhalation BID  . aspirin EC  81 mg Oral Daily  . atorvastatin  10 mg Oral Daily  . bethanechol  10 mg Oral TID  . insulin aspart  0-9 Units Subcutaneous TID WC  . pantoprazole  40 mg Oral Q0600  . sodium chloride flush  3 mL Intravenous Q12H  . tamsulosin  0.4 mg Oral QPC supper   Continuous Infusions:  PRN Meds: acetaminophen **OR** acetaminophen, albuterol, ondansetron **OR** ondansetron (ZOFRAN) IV  Allergies:    Allergies  Allergen Reactions  . Anticoagulant Compound Other (See Comments)    Pt had intracranial bleed, therefore all anticoagulation is contraindicated per Dr. Ron Parker  . Other Other (See Comments)    Per Dr. Halford Chessman (Surgeon): stated that the patient cannot be put under for any surgery, as he has an enlarged aorta. He would stand only a 50/50 chance of surviving. He has lung issues, diminished lung tissue, COPD, and emphysema.  . Warfarin Sodium Other (See Comments)    Pt had intracranial bleed, therefore all anticoagulation is contraindicated per Dr. Ron Parker    Social History:   Social History   Socioeconomic History  . Marital status: Married    Spouse name: [name]  . Number of children: 2  . Years of education: College  . Highest education level: Not on file  Occupational History  . 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 Smoking status: Former Smoker    Packs/day: 1.00    Years: 45.00    Pack years: 45.00    Types: Cigarettes    Quit date: 2011    Years since  quitting: 10.4  . Smokeless tobacco: Never  Used  Substance and Sexual Activity  . Alcohol use: No    Alcohol/week: 0.0 standard drinks    Comment: 08/14/2013 "used to drink beer; quit:in 1982"  . Drug use: No  . Sexual activity: Yes  Other Topics Concern  . Not on file  Social History Narrative   Patient lives at home with his spouse.   Worked for the post office   Has two boys and a girl. All live local.    1 Mining engineer   Social Determinants of Health   Financial Resource Strain: Low Risk   . Difficulty of Paying Living Expenses: Not hard at all  Food Insecurity: No Food Insecurity  . Worried About Charity fundraiser in the Last Year: Never true  . Ran Out of Food in the Last Year: Never true  Transportation Needs: No Transportation Needs  . Lack of Transportation (Medical): No  . Lack of Transportation (Non-Medical): No  Physical Activity: Inactive  . Days of Exercise per Week: 0 days  . Minutes of Exercise per Session: 0 min  Stress: No Stress Concern Present  . Feeling of Stress : Only a little  Social Connections: Unknown  . Frequency of Communication with Friends and Family: More than three times a week  . Frequency of Social Gatherings with Friends and Family: Three times a week  . Attends Religious Services: Not on file  . Active Member of Clubs or Organizations: Not on file  . Attends Archivist Meetings: Not on file  . Marital Status: Married  Human resources officer Violence:   . Fear of Current or Ex-Partner:   . Emotionally Abused:   Marland Kitchen Physically Abused:   . Sexually Abused:      Family History:    Family History  Problem Relation Age of Onset  . Stomach cancer Father   . Stroke Mother      ROS:  All other ROS reviewed and negative. Pertinent positives noted in the HPI.     Physical Exam/Data:   Vitals:   12/07/19 0021 12/07/19 0351 12/07/19 0744 12/07/19 0750  BP: 118/61  108/65   Pulse: 72  73   Resp: 17  20   Temp: 97.9 F  (36.6 C) 97.7 F (36.5 C) 97.9 F (36.6 C)   TempSrc: Oral Oral Oral   SpO2: 94% 90% 96% 98%  Weight:  70.6 kg    Height:  6\' 1"  (1.854 m)       Intake/Output Summary (Last 24 hours) at 12/07/2019 0939 Last data filed at 12/07/2019 0500 Gross per 24 hour  Intake --  Output 200 ml  Net -200 ml    Last 3 Weights 12/07/2019 12/03/2019 11/25/2019  Weight (lbs) 155 lb 10.3 oz 157 lb 158 lb 12.8 oz  Weight (kg) 70.6 kg 71.215 kg 72.031 kg    Body mass index is 20.53 kg/m.  General: Frail, ill-appearing, cachexia noted Head: Atraumatic, normal size  Eyes: PEERLA, EOMI  Neck: JVD noted 6-7 cmH2O, positive HJR Endocrine: No thryomegaly Cardiac: Normal S1, S2; RRR; no murmurs, rubs, or gallops Lungs: Rales in rhonchi noted bilaterally Abd: Soft, nontender, no hepatomegaly  Ext: 2+ pitting edema Musculoskeletal: No deformities, BUE and BLE strength normal and equal Skin: Warm and dry, no rashes   Neuro: Alert and oriented to person, place, time, and situation, CNII-XII grossly intact, no focal deficits  Psych: Normal mood and affect   EKG:  The EKG was personally reviewed and demonstrates: V  paced rhythm with heart rate 73 Telemetry:  Telemetry was personally reviewed and demonstrates: V paced rhythm with heart rate in the 70s  Relevant CV Studies: TTE 11/14/2019 1. LVEF is severely depressed with diffuse hypokinesis, worse in the  septal and apical walls Compared to report from Nov 2020 echo, LVEF is  worse (apical views foreshortened in 2020 exam which makes assessment  difficult). Left ventricular ejection  fraction, by estimation, is 25 to 30%. The left ventricle has severely  decreased function. The left ventricle demonstrates global hypokinesis.  There is mild left ventricular hypertrophy. Left ventricular diastolic  parameters are indeterminate.  2. Right ventricular systolic function is moderately reduced. The right  ventricular size is severely enlarged. There is moderately  elevated  pulmonary artery systolic pressure.  3. Left atrial size was severely dilated.  4. Right atrial size was severely dilated.  5. A 33 mm Medtronic Mosai bioprosthesisiis present Peak and mean  gradients through the vave are 15 and 6 mm Hg respectively, both lower  than previous echo report in 2020.Marland Kitchen The mitral valve has been  repaired/replaced. Trivial mitral valve regurgitation.  6. The aortic valve is abnormal. Aortic valve regurgitation is not  visualized. Mild to moderate aortic valve sclerosis/calcification is  present, without any evidence of aortic stenosis.  7. Aortic root is 51 mm Previous echo measured echo above this; current  exam did not image that high.. Aortic dilatation noted. Aneurysm of the  aortic root. There is moderate to severe dilatation of the aortic root  measuring 51 mm.  8. The inferior vena cava is dilated in size with <50% respiratory  variability, suggesting right atrial pressure of 15 mmHg.   CTA 12/06/2019 IMPRESSION: 1. No evidence for an acute vascular abnormality. There is no evidence for a thoracic aortic dissection. No evidence for an acute pulmonary embolism. 2. 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 3. Profound cardiomegaly with findings of volume overload including small bilateral pleural effusions and body wall edema. The right-sided pleural effusion appears to be at least partially loculated. 4. Findings are concerning for a cecal mass as detailed above. Follow-up with nonemergent colonoscopy is recommended. 5. Significantly distended urinary bladder. 6. Findings suspicious for underlying cirrhosis. 7. Relatively stable mediastinal and hilar adenopathy of unknown clinical significance. 8. Severe emphysematous changes with findings consistent with infectious or reactive bronchiolitis at the lung bases.  Laboratory Data: High Sensitivity Troponin:   Recent Labs  Lab  11/15/19 1123 11/15/19 1238 11/15/19 1636 12/06/19 1824 12/06/19 2140  TROPONINIHS 19* 18* 21* 19* 19*     Cardiac EnzymesNo results for input(s): TROPONINI in the last 168 hours. No results for input(s): TROPIPOC in the last 168 hours.  Chemistry Recent Labs  Lab 12/06/19 1824 12/07/19 0029  NA 142 140  K 4.9 3.7  CL 102 99  CO2 29 30  GLUCOSE 120* 173*  BUN 22 20  CREATININE 1.18 1.10  CALCIUM 8.6* 8.5*  GFRNONAA >60 >60  GFRAA >60 >60  ANIONGAP 11 11    No results for input(s): PROT, ALBUMIN, AST, ALT, ALKPHOS, BILITOT in the last 168 hours. Hematology Recent Labs  Lab 12/06/19 1824 12/07/19 0029  WBC 6.8 6.2  RBC 4.29 4.43  HGB 10.2* 10.3*  HCT 36.6* 37.5*  MCV 85.3 84.7  MCH 23.8* 23.3*  MCHC 27.9* 27.5*  RDW 27.4* 27.3*  PLT 295 305   BNP Recent Labs  Lab 12/07/19 0029  BNP 1,365.1*    DDimer  No results for input(s): DDIMER in the last 168 hours.  Radiology/Studies:  DG Chest 2 View  Result Date: 12/06/2019 CLINICAL DATA:  Chest pain and shortness of breath. Previous myocardial infarct congestive heart failure. EXAM: CHEST - 2 VIEW COMPARISON:  11/17/2019 FINDINGS: Moderate cardiac enlargement shows no significant change. Transvenous pacemaker remains in appropriate position. Pulmonary emphysema again noted. Increased atelectasis or consolidation is seen in the retrocardiac left lower lobe since prior study. Small bilateral pleural effusions show no significant change. Diffuse interstitial infiltrates are also stable, consistent with diffuse interstitial edema. IMPRESSION: Increased left basilar atelectasis versus consolidation. Stable cardiomegaly, diffuse interstitial infiltrates, and small bilateral pleural effusions, consistent with congestive heart failure. Electronically Signed   By: Marlaine Hind M.D.   On: 12/06/2019 18:55   DG Thoracic Spine 2 View  Result Date: 12/06/2019 CLINICAL DATA:  Fall.  Thoracic back pain.  Initial encounter. EXAM:  THORACIC SPINE 2 VIEWS COMPARISON:  None. FINDINGS: There is no evidence of thoracic spine fracture. Alignment is normal. No focal lytic or sclerotic bone lesions identified. Mild degenerative disc disease seen in the mid and lower thoracic spine, and cervical spine. Generalized osteopenia also noted. IMPRESSION: No acute findings. Degenerative spondylosis, as described above.  Osteopenia. Electronically Signed   By: Marlaine Hind M.D.   On: 12/06/2019 19:40   DG Lumbar Spine Complete  Result Date: 12/06/2019 CLINICAL DATA:  74 year old male with fall and back pain. EXAM: LUMBAR SPINE - COMPLETE 4+ VIEW COMPARISON:  Lumbar spine radiograph dated 11/16/2019. FINDINGS: Five lumbar type vertebra. There is no acute fracture or subluxation of the lumbar spine. Multilevel mild chronic compression injury most prominent involving L1 and L4. Multilevel degenerative changes with bone spurring. Multilevel disc desiccation and vacuum phenomena. Grade 1 L3-L4 retrolisthesis and L4-L5 anterolisthesis. Atherosclerotic calcification of the abdominal aorta. IMPRESSION: No acute/traumatic lumbar spine pathology. Electronically Signed   By: Anner Crete M.D.   On: 12/06/2019 19:39   CT Head Wo Contrast  Result Date: 12/06/2019 CLINICAL DATA:  74 year old male with head trauma. EXAM: CT HEAD WITHOUT CONTRAST CT CERVICAL SPINE WITHOUT CONTRAST TECHNIQUE: Multidetector CT imaging of the head and cervical spine was performed following the standard protocol without intravenous contrast. Multiplanar CT image reconstructions of the cervical spine were also generated. COMPARISON:  Head CT dated 09/24/2015. FINDINGS: CT HEAD FINDINGS Brain: Moderate age-related atrophy and chronic microvascular ischemic changes. 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 There is no acute intracranial hemorrhage. No mass effect midline shift. No extra-axial fluid collection. Vascular: No hyperdense vessel or unexpected calcification. Skull: Normal. Negative for  fracture or focal lesion. Sinuses/Orbits: No acute finding. Other: None CT CERVICAL SPINE FINDINGS Alignment: No acute subluxation. Skull base and vertebrae: No acute fracture. Osteopenia. Soft tissues and spinal canal: No prevertebral fluid or swelling. No visible canal hematoma. Disc levels:  Multilevel degenerative changes. Upper chest: Emphysema. Partially visualized moderate to large left pleural effusion or hemothorax. Lobulated right pleural effusion or thickening. Increase in the size of the pleural effusions compared to the CT of 11/07/2019. Other: Fracture of the left clavicular head, acute or possibly subacute new since the prior CT of 11/07/2019. IMPRESSION: 1. No acute intracranial hemorrhage. 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 2.  acute/traumatic cervical spine pathology. 3. Fracture of the left clavicular head, acute or possibly subacute new since the prior CT of 11/07/2019. 4. Partially visualized moderate to large left pleural effusion or hemothorax. Increase in the size of the pleural effusions compared  to the CT of 11/07/2019. Electronically Signed   By: Anner Crete M.D.   On: 12/06/2019 20:02   CT Cervical Spine Wo Contrast  Result Date: 12/06/2019 CLINICAL DATA:  74 year old male with head trauma. EXAM: CT HEAD WITHOUT CONTRAST CT CERVICAL SPINE WITHOUT CONTRAST TECHNIQUE: Multidetector CT imaging of the head and cervical spine was performed following the standard protocol without intravenous contrast. Multiplanar CT image reconstructions of the cervical spine were also generated. COMPARISON:  Head CT dated 09/24/2015. FINDINGS: CT HEAD FINDINGS Brain: Moderate age-related atrophy and chronic microvascular ischemic changes. 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 There is no acute intracranial hemorrhage. No mass effect midline shift. No extra-axial fluid collection. Vascular: No hyperdense vessel or unexpected calcification. Skull: Normal.  Negative for fracture or focal lesion. Sinuses/Orbits: No acute finding. Other: None CT CERVICAL SPINE FINDINGS Alignment: No acute subluxation. Skull base and vertebrae: No acute fracture. Osteopenia. Soft tissues and spinal canal: No prevertebral fluid or swelling. No visible canal hematoma. Disc levels:  Multilevel degenerative changes. Upper chest: Emphysema. Partially visualized moderate to large left pleural effusion or hemothorax. Lobulated right pleural effusion or thickening. Increase in the size of the pleural effusions compared to the CT of 11/07/2019. Other: Fracture of the left clavicular head, acute or possibly subacute new since the prior CT of 11/07/2019. IMPRESSION: 1. No acute intracranial hemorrhage. 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 2.  acute/traumatic cervical spine pathology. 3. Fracture of the left clavicular head, acute or possibly subacute new since the prior CT of 11/07/2019. 4. Partially visualized moderate to large left pleural effusion or hemothorax. Increase in the size of the pleural effusions compared to the CT of 11/07/2019. Electronically Signed   By: Anner Crete M.D.   On: 12/06/2019 20:02   CT Angio Chest/Abd/Pel for Dissection W and/or Wo Contrast  Result Date: 12/06/2019 CLINICAL DATA:  Chest pain.  Concern for aortic dissection. EXAM: CT ANGIOGRAPHY CHEST, ABDOMEN AND PELVIS TECHNIQUE: Non-contrast CT of the chest was initially obtained. Multidetector CT imaging through the chest, abdomen and pelvis was performed using the standard protocol during bolus administration of intravenous contrast. Multiplanar reconstructed images and MIPs were obtained and reviewed to evaluate the vascular anatomy. CONTRAST:  153mL OMNIPAQUE IOHEXOL 350 MG/ML SOLN COMPARISON:  11/07/2019 FINDINGS: CTA CHEST FINDINGS Cardiovascular: There is significant cardiac enlargement. The main pulmonary artery is dilated without evidence for  an acute pulmonary embolism. There is no evidence for thoracic aortic dissection. Coronary artery calcifications are noted. The arch vessels are grossly patent. 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 This is essentially unchanged from prior study dated 11/07/2019. Mediastinum/Nodes: --there is mild mediastinal adenopathy. This is essentially stable from prior study. --No axillary lymphadenopathy. --No supraclavicular lymphadenopathy. --Normal thyroid gland. --The esophagus is unremarkable Lungs/Pleura: There are moderate severe emphysematous changes bilaterally. There is interlobular septal thickening. There are moderate-sized bilateral pleural effusions, left greater than right. The right-sided pleural effusion appears to be at least partially loculated. There is bronchial wall thickening and mild mucous plugging at the lung bases bilaterally. There are postsurgical changes in the left upper lobe likely related to prior wedge resection. Musculoskeletal: No chest wall abnormality. No acute or significant osseous findings. Review of the MIP images confirms the above findings. CTA ABDOMEN AND PELVIS FINDINGS VASCULAR Aorta: Normal caliber aorta without aneurysm, dissection, vasculitis or significant stenosis. Celiac: There is a mild loop shaped curvature of the celiac axis  with associated narrowing. This can be seen in patients with median arcuate ligament syndrome. SMA: There is variant hepatic arterial anatomy with a replaced common hepatic artery arising from the SMA. Renals: Multiple bilateral renal arteries are noted, all of which appear to be grossly patent. IMA: Patent without evidence of aneurysm, dissection, vasculitis or significant stenosis. Inflow: Patent without evidence of aneurysm, dissection, vasculitis or significant stenosis. Veins: No obvious venous abnormality within the limitations of this arterial phase study. Review of the MIP  images confirms the above findings. NON-VASCULAR Hepatobiliary: The liver appears cirrhotic. Normal gallbladder.There is no biliary ductal dilation. Pancreas: Normal contours without ductal dilatation. No peripancreatic fluid collection. Spleen: Unremarkable. Adrenals/Urinary Tract: --Adrenal glands: Unremarkable. --Right kidney/ureter: No hydronephrosis or radiopaque kidney stones. --Left kidney/ureter: No hydronephrosis or radiopaque kidney stones. --Urinary bladder: The urinary bladder is significantly distended. Stomach/Bowel: --Stomach/Duodenum: No hiatal hernia or other gastric abnormality. Normal duodenal course and caliber. --Small bowel: Unremarkable. --Colon: There is an masslike appearance of the cecum with multiple small mildly enlarged regional lymph nodes. --Appendix: Normal. Lymphatic: --No retroperitoneal lymphadenopathy. --No mesenteric lymphadenopathy. --No pelvic or inguinal lymphadenopathy. Reproductive: Prostate gland is enlarged. Other: There is a mild amount presacral free fluid. There is no free air. There is mild body wall edema. Musculoskeletal. Old compression fractures are noted throughout the lumbar spine with multilevel degenerative changes. These are essentially stable from prior study. Review of the MIP images confirms the above findings. IMPRESSION: 1. No evidence for an acute vascular abnormality. There is no evidence for a thoracic aortic dissection. No evidence for an acute pulmonary embolism. 2. 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 3. Profound cardiomegaly with findings of volume overload including small bilateral pleural effusions and body wall edema. The right-sided pleural effusion appears to be at least partially loculated. 4. Findings are concerning for a cecal mass as detailed above. Follow-up with nonemergent colonoscopy is recommended. 5. Significantly distended urinary bladder. 6. Findings suspicious for underlying  cirrhosis. 7. Relatively stable mediastinal and hilar adenopathy of unknown clinical significance. 8. Severe emphysematous changes with findings consistent with infectious or reactive bronchiolitis at the lung bases. Aortic Atherosclerosis (ICD10-I70.0) and Emphysema (ICD10-J43.9). Electronically Signed   By: Constance Holster M.D.   On: 12/06/2019 20:08    Assessment and Plan:  1. Fall: Fall after leaving the bathroom.  This is likely orthostatic hypotension versus a vasovagal episode.  Troponins are negative.  EKG with paced rhythm.  No major change in symptoms.  He does report some chest pain but this is noncardiac sharp pain.  I have a low suspicion for acute coronary syndrome.  He does have an advanced cardiomyopathy and has end-stage congestive heart failure.  He is not been able to tolerate medications due to hypotension.  He is not a candidate for advanced therapies.  Right now, I would not recommend any further cardiac work-up.  I would recommend diuresis as discussed below.  I would also recommend hospice and palliative care consult.  I did discuss this with him and his wife.  They are more interested.  He will least need SNF placement. 2. Systolic heart failure, ejection fraction 25%, inability to tolerate guideline directed medical therapy: He has an end-stage cardiomyopathy.  He has volume overload on exam today.  I will go ahead and diurese him 80 mg IV twice daily.  He has been unable to tolerate ACE/ARB.  He was on metoprolol at home.  This appears to be the only medication.  Due to his severe COPD and now a cecal mass he is clearly not a candidate for advanced therapies.  I have recommended palliative care and hospice evaluation.  We will not get much benefit from aggressive therapy at this time.  I did discuss this with his wife [name].  She is in agreement.  She appears to just need help at home.  They are quite frustrated as he frequently falls and is quite frail. 3. Permanent atrial  fibrillation status post AV nodal ablation: No issues.  I do not suspect his syncope was an arrhythmia.  He reports no ICD shocks.  This does not need further evaluation.  He is not anticoagulation.  He is status post left atrial appendage clipping.  He has had prior hemorrhagic bleeding as well.  For questions or updates, please contact [place] Please consult [email or web address] for contact info under   Signed, Lake Bells T. Audie Box, [place]  12/07/2019 9:39 AM

## 2019-12-07 NOTE — Progress Notes (Signed)
[facility] Documentation    Pt is a current pt in [facility] Palliative program. Liaison to follow pt through course of hospital stay to ensure follow up with Palliative NP upon discharge.     Please outreach with any questions.     Thank you,   Jennifer Love, RN  [facility] Hospital Liaison  [phone]   

## 2019-12-07 NOTE — Care Management (Addendum)
Patient is new to palliative services with [facility].  Per palliative, patient wishes to go to SNF and wife requests information regarding hospice services when returns home. Message relayed to [facility] with [place]. She will make sure palliative will review with patient's wife.   Patient awaiting therapy evals- last recommendation is for [facility].

## 2019-12-07 NOTE — Consult Note (Signed)
Palliative Medicine Inpatient Consult Note  Reason for consult:  Goals of Care  HPI:  Per intake H&P --> [name] is a 74 y.o. 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 fib on aspirin alone (Hx of ICH while on Coumadin) s/p AV nodal ablation, severe mitral regurgitation s/p bioprosthetic mitral valve replacement, pulmonary hypertension, non-small cell left lung cancer s/p wedge resection, type 2 diabetes, hypertension, and hyperlipidemia who presents to the ED for evaluation of chest pain.  Palliative care was asked to get involved to help address goals of care. Per chart review patient had recently been seen by OP Palliative Care  Clinical Assessment/Goals of Care: I have reviewed medical records including EPIC notes, labs and imaging, received report from bedside RN, assessed the patient.    I met with [name] and called [name]  to further discuss diagnosis prognosis, GOC, EOL wishes, disposition and options.   I introduced Palliative Medicine as specialized medical care for people living with serious illness. It focuses on providing relief from the symptoms and stress of a serious illness. The goal is to improve quality of life for both the patient and the family.  I asked [name] to tell me about himself. He said that he is from [place], [place]. He was drafted in the [employer] during the [place] era. He did not serve in [place] and got medically released due to a pneumothorax. He later went on to work at the [employer] which he did for twenty years. He has been married twice and has a daughter from his first marriage. He has been married to his second wife, [name] for the last thirty eight years. They shared a 73 year old [name] who they had to put to sleep in October. Since then [name]  has declined rapidly per [name]. They are members of [employer].   At home, [name] is able to participate in all bADL's. He is still driving.   I asked [name] what he understands about his heart failure. He said that he has had this for the last eight to ten years. I told him that heart failure is a chronic disease. Described the spectrum of heart failure to him in accordance with NYHA criteria. We reviewed heart failure classes as they relate to his current condition. He seemed to understand that this was a large contributor to his generalized weakness.   A detailed discussion was had today regarding advanced directives, there are non on file therefore I will request chaplain support for advanced directive completion.   A MOST form was introduced. Concepts specific to code status, artifical feeding and hydration, continued IV antibiotics and rehospitalization was had.  The patient and family outlined their wishes for the following treatment decisions:  Cardiopulmonary Resuscitation: Do Not Attempt Resuscitation (DNR/No CPR)  Medical Interventions: Limited Additional Interventions: Use medical treatment, IV fluids and cardiac monitoring as indicated, DO NOT USE intubation or mechanical ventilation. May consider use of less invasive airway support such as BiPAP or CPAP. Also provide comfort measures. Transfer to the hospital if indicated. Avoid intensive care.   Antibiotics: Determine use of limitation of antibiotics when infection occurs  IV Fluids: IV fluids for a defined trial period  Feeding Tube: No feeding tube   The difference between a aggressive medical intervention path  and  a palliative comfort care path for this patient at this time was had. We discussed hospice which is a service used to ensure dignity and quality at the end of life. [name] said that he does not want hospice at this point in time. He asked about rehabilitation. I shared with him that realistically this is  the safest way he would be able to get back home. We discussed strength optimization which he would like to pursue.  [name] is agreeable to OP Palliative services being continued.  Per [name] has been a huge worry of hers. She shares that there are no financial resources to provide additional caregivers and she is constantly worried at home that he may fall. She feels that he has been very depressed since losing their dog and he has not been expressing these feelings. She also feels that going to SNF is the best way to stabilize [name]. [name] is interested in learning more about hospice services for the future, I informed the CM team of this.   Discussed the importance of continued conversation with family and their  medical providers regarding overall plan of care and treatment options, ensuring decisions are within the context of the patients values and GOCs.  Decision Maker: Patient can make decisions for himself. His wife, [name] would be his decisions maker if he were unable to do so.   SUMMARY OF RECOMMENDATIONS   DNAR/DNI  Treat what is treatable  MOST complete --> Placed in chart  DNR completed --> Placed in chart  Advanced Directive --> Chaplain consult  TOC --> Patient would like to go to SNF. Patients wife, [name] is interested in learning more about hospice (for the future) though presently likes advanced home care and OP Palliative care.  Patients wife is interested in getting a rollator walker for home  Code Status/Advance Care Planning: DNAR/DNI   Symptom Management:  Muscular Weakness:                 - Physical Therapy Evaluation                 - Occupational Therapy Evaluation  Dyspnea:  - Consider sgarting  liquid morphine 10m PO Q3H PRN     Depression:  - Start citalopram 123mPO QDay   Palliative Prophylaxis:   Pain, constipation, delirium  Additional Recommendations (Limitations, Scope, Preferences):  Treat what is treatable     Psycho-social/Spiritual:   Desire for further Chaplaincy support: Yes  Additional Recommendations: Education on Hospice   Prognosis: Poor, < 6 months  Discharge Planning: Discharge to SNF with OP Palliative Care  PPS: 60%   This conversation/these recommendations were discussed with patient primary care team, Dr. ThGrandville SilosTime In: 1300 Time Out: 1415 Total Time: 75 Greater than 50%  of this time was spent counseling and coordinating care related to the above assessment and plan.  [facility] Team Cell Phone: [phone] utilize secure chat with additional questions, if there is no response within 30 minutes please call the above phone number  Palliative Medicine Team providers are available by phone from 7am to 7pm daily and can be reached through the team cell phone.  Should this patient require assistance outside of these hours, please call the patient's attending physician.

## 2019-12-07 NOTE — Progress Notes (Signed)
PROGRESS NOTE    [name]  IDP:[id number] DOB: [birth year] DOA: 12/06/2019 PCP: [name], NP    Chief Complaint  Patient presents with  . Fall  . Shortness of Breath    Brief Narrative: (Start on day 1 of progress note - keep it brief and live) HPI per Dr. Garrel Ridgel is a 74 y.o. male with medical history significant for end-stage chronic combined systolic and diastolic CHF (EF 44-31%) s/p BiV ICD, chronic severe COPD/emphysema with chronic respiratory failure on 5 L of home O2 via [place], permanent A. fib on aspirin alone (Hx of ICH while on Coumadin) s/p AV nodal ablation, severe mitral regurgitation s/p bioprosthetic mitral valve replacement, pulmonary hypertension, non-small cell left lung cancer s/p wedge resection, type 2 diabetes, hypertension, and hyperlipidemia who presents to the ED for evaluation of chest pain.  Patient recently hospitalized from 11/13/2019-11/25/2019 for acute on chronic CHF.  He was diuresed while in hospital.  Therapy evaluation recommended SNF however patient declined and he was discharged to home with home health and increase O2 requirement to 5 L via [place].  He was discharged on Lasix 60 mg twice daily.  Patient states around 34 AM earlier today (12/06/2019) he developed significant chest pain across his chest when he was walking down the hall.  He went into the kitchen and says he blacked out and fell to the ground.  He denied any significant injury.  He has been having continued chest pain since.  He came to the ED for further evaluation.  He reports continued swelling in both of his lower extremities.  He denies any significant change in his breathing.  He denies any cough.  He denies any subjective fevers, chills, diaphoresis, abdominal pain, or dysuria.  ED Course:  Initial vitals showed BP 105/61, pulse 70, RR 18, temp 97.2 Fahrenheit, SPO2 96% on 15 L NRB.  Patient was weaned down to 6 L O2 via [place].  Labs are notable for WBC 6.8, hemoglobin  10.2, platelets 295,000, sodium 142, potassium 4.9, bicarb 29, BUN 22, creatinine 1.18, serum glucose 120, magnesium 1.9, high-sensitivity troponin I 19.  EKG shows V paced rhythm.  SARS-CoV-2 PCR is negative.  2 view chest x-ray shows stable cardiomegaly, diffuse interstitial infiltrates, small bilateral pleural effusions, and increased left basilar atelectasis versus consolidation.  Thoracic spine x-ray is negative for acute findings.  Lumbar spine x-ray is negative for acute/traumatic lumbar spine pathology.  CT head and cervical spine without contrast are negative for acute intracranial hemorrhage or acute/traumatic cervical spine pathology.  Acute or possibly subacute fracture of the left clavicular head is noted.  CT chest/abdomen/pelvis dissection study is negative for evidence of acute vascular abnormality, thoracic aortic dissection, or PE.  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  Small bilateral pleural effusions with partially loculated right-sided pleural effusion noted.  Masslike appearance of the cecum with multiple small mildly enlarged regional lymph nodes noted.  Distended urinary bladder and findings of suspected underlying cirrhosis are seen.  Severe emphysematous changes with infectious or reactive bronchiolitis at the lung bases also reported.  The hospitalist service was consulted to me for further evaluation and management.   Assessment & Plan:   Principal Problem:   Syncope Active Problems:   COPD GOLD II   Hypertension associated with diabetes ([place])   Hyperlipidemia associated with type 2 diabetes mellitus (HCC)   Permanent atrial fibrillation   ICD (implantable cardioverter-defibrillator), biventricular, in situ   H/O intracranial hemorrhage   Acute  on chronic respiratory failure with hypoxia (HCC)   Type 2 diabetes mellitus (HCC)   Chronic combined systolic and diastolic CHF (congestive heart failure) ([place])   1  Fall at home/??   Syncope/orthostasis Patient reported to admitting physician that he had a syncopal episode at home from standing position after walking a short distance however denies any significant injury.  Patient did complain of lightheadedness and dizziness prior to the fall and patient noted to have increasing falls recently.  Patient however denied syncopal episode to me as well as to the cardiologist.  CT head done negative for acute intracranial hemorrhage.  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  Acute/traumatic cervical spine pathology.  Fracture of left clavicular head, acute or possibly subacute new since prior CT of 11/07/2019.  Partially visualized moderate to large left pleural effusion or hemothorax.  Due to concern for possible syncopal episode in the setting of complicated cardiac history cardiology was consulted and assessed the patient.  It was felt patient's fall at home likely orthostatic versus vasovagal.  Orthostatic vital signs ordered but not done.  Patient noted on presentation to the ED to have soft blood pressures with systolics in the 60A to 54U.  Patient diuretics held on admission as well as Toprol-XL.  Patient denies any dizzy spells.  Blood pressure improved.  Appreciate cardiology input and recommendations.  PT/OT.  2.  Acute on chronic respiratory failure with hypoxia likely secondary to acute on chronic combined systolic and diastolic CHF/end-stage CHF/status post AICD/bilateral pleural effusions/pulmonary hypertension/chronic COPD Patient noted to be hypoxic at home of 74 to 80% on 5 L O2 on EMS arrival.  At time of admission oxygen sats had improved on 5 L O2.  Patient noted with end-stage CHF and severe COPD/emphysema with CT findings of pulmonary edema bilateral effusions.  Due to borderline blood pressure concern for orthostatic etiology for falls.  Diuretics held on admission as well as beta-blocker.  Patient seen in consultation  by cardiology.  Patient noted to be volume overloaded on examination.  Patient started on Lasix 80 mg IV every 12 hours per cardiology recommendations.  Patient noted not to be able to tolerate ACE inhibitors/ARB in the past.  Beta-blocker on hold.  Patient noted not to be a candidate for advanced therapies.  Palliative care has also been consulted for goals of care.  Patient likely will need SNF with palliative care following versus transitioning to hospice.  Cardiology following and appreciate input and recommendations.  3.  Permanent A. fib status post AV nodal ablation Per cardiology questionable syncope likely not secondary to an arrhythmia.  Patient status post left atrial appendage clipping.  Heart rate currently rate controlled.  Beta-blocker on hold due to concern for orthostasis.  Patient not a anticoagulation candidate due to prior history of intracerebral hemorrhage.  Aspirin 81 mg daily.  Appreciate cardiology input and recommendations.  4.  Severe COPD with chronic respiratory failure Continue Brovana nebs, Incruse Ellipta.  Albuterol nebs as needed.  Continue O2.  Patient noted to be on 5 L nasal cannula prior to admission.  5.  Diabetes mellitus type 2 Hemoglobin A1c 7.4 on 11/14/2019.  CBG of 120 this morning.  Hold oral hypoglycemic agents.  Place on sliding scale insulin.  Follow.  6.  Hyperlipidemia Continue statin.  7.  Hypertension Due to concerns that falls likely due to orthostasis Toprol-XL on hold.  Patient's diuretics resumed per cardiology.  Follow.  8.  History of stage  I non-small cell lung cancer left Status post wedge resection.  Outpatient follow-up.  9.  Masslike appearance of the cecum Per CT imaging.  Admitting physician discussed with patient.  May consider further nonemergent evaluation with colonoscopy however given patient's severe cardiac and pulmonary chronic medical problems with recent involvement of palliative care likely not to be beneficial at this  point in time.  Palliative care has been consulted.   DVT prophylaxis: SCDs Code Status: DNR Family Communication: Updated patient and wife at bedside. Disposition:   Status is: Inpatient    Dispo: The patient is from: Home              Anticipated d/c is to: SNF with palliative care following.              Anticipated d/c date is: In about 2 to 3 days.              Patient currently on IV diuretics, presenting with fall felt likely secondary to orthostasis, likely needing placement as patient with multiple falls at home and wife unable to take care of patient.  Patient likely needs palliative care following.        Consultants:   Cardiology: Dr. Audie Box 12/07/2019  Palliative care  Procedures:  CT head CT C-spine 12/06/2019  CT angiogram chest and abdomen 12/06/2019  Plain films of the L-spine 12/06/2019  Plain films of the T-spine 12/06/2019  Antimicrobials:   None   Subjective: Patient sitting up on the side of the bed.  Wife at bedside.  Denies any chest pain.  States lightheadedness and dizziness have improved since admission.  Denies any syncopal episodes.  Objective: Vitals:   12/07/19 0351 12/07/19 0744 12/07/19 0750 12/07/19 1157  BP:  108/65 120/74 113/63  Pulse:  73 74 75  Resp:  20 16 18   Temp: 97.7 F (36.5 C) 97.9 F (36.6 C)  98.2 F (36.8 C)  TempSrc: Oral Oral  Oral  SpO2: 90% 96% 98% 90%  Weight: 70.6 kg     Height: 6\' 1"  (1.854 m)       Intake/Output Summary (Last 24 hours) at 12/07/2019 1223 Last data filed at 12/07/2019 1211 Gross per 24 hour  Intake 745 ml  Output 775 ml  Net -30 ml   Filed Weights   12/07/19 0351  Weight: 70.6 kg    Examination:  General exam: Appears calm and comfortable.  Chronically ill-appearing. Respiratory system: Coarse breath sounds bibasilar.  No wheezing.  Speaking in full sentences.  Normal respiratory effort.  Cardiovascular system: S1 & S2 heard, RRR.  Positive JVD.  1-2+ bilateral lower  extremity edema.   Gastrointestinal system: Abdomen is nondistended, soft and nontender. No organomegaly or masses felt. Normal bowel sounds heard. Central nervous system: Alert and oriented. No focal neurological deficits. Extremities: Symmetric 5 x 5 power. Skin: No rashes, lesions or ulcers Psychiatry: Judgement and insight appear normal. Mood & affect appropriate.     Data Reviewed: I have personally reviewed following labs and imaging studies  CBC: Recent Labs  Lab 12/06/19 1824 12/07/19 0029  WBC 6.8 6.2  HGB 10.2* 10.3*  HCT 36.6* 37.5*  MCV 85.3 84.7  PLT 295 193    Basic Metabolic Panel: Recent Labs  Lab 12/06/19 1824 12/07/19 0029  NA 142 140  K 4.9 3.7  CL 102 99  CO2 29 30  GLUCOSE 120* 173*  BUN 22 20  CREATININE 1.18 1.10  CALCIUM 8.6* 8.5*  MG 1.9  --  GFR: Estimated Creatinine Clearance: 60.6 mL/min (by C-G formula based on SCr of 1.1 mg/dL).  Liver Function Tests: No results for input(s): AST, ALT, ALKPHOS, BILITOT, PROT, ALBUMIN in the last 168 hours.  CBG: Recent Labs  Lab 12/07/19 0743 12/07/19 1153  GLUCAP 120* 163*     Recent Results (from the past 240 hour(s))  SARS Coronavirus 2 by RT PCR (hospital order, performed in [facility] hospital lab) Nasopharyngeal Nasopharyngeal Swab     Status: None   Collection Time: 12/06/19  6:24 PM   Specimen: Nasopharyngeal Swab  Result Value Ref Range Status   SARS Coronavirus 2 NEGATIVE NEGATIVE Final    Comment: (NOTE) SARS-CoV-2 target nucleic acids are NOT DETECTED. The SARS-CoV-2 RNA is generally detectable in upper and lower respiratory specimens during the acute phase of infection. The lowest concentration of SARS-CoV-2 viral copies this assay can detect is 250 copies / mL. A negative result does not preclude SARS-CoV-2 infection and should not be used as the sole basis for treatment or other patient management decisions.  A negative result may occur with improper specimen  collection / handling, submission of specimen other than nasopharyngeal swab, presence of viral mutation(s) within the areas targeted by this assay, and inadequate number of viral copies (<250 copies / mL). A negative result must be combined with clinical observations, patient history, and epidemiological information. Fact Sheet for Patients:   StrictlyIdeas.no Fact Sheet for Healthcare Providers: BankingDealers.co.za This test is not yet approved or cleared  by the [place] FDA and has been authorized for detection and/or diagnosis of SARS-CoV-2 by FDA under an Emergency Use Authorization (EUA).  This EUA will remain in effect (meaning this test can be used) for the duration of the COVID-19 declaration under Section 564(b)(1) of the Act, 21 U.S.C. section 360bbb-3(b)(1), unless the authorization is terminated or revoked sooner. Performed at [facility], [place] [address] [place], [place] [address]          Radiology Studies: DG Chest 2 View  Result Date: 12/06/2019 CLINICAL DATA:  Chest pain and shortness of breath. Previous myocardial infarct congestive heart failure. EXAM: CHEST - 2 VIEW COMPARISON:  11/17/2019 FINDINGS: Moderate cardiac enlargement shows no significant change. Transvenous pacemaker remains in appropriate position. Pulmonary emphysema again noted. Increased atelectasis or consolidation is seen in the retrocardiac left lower lobe since prior study. Small bilateral pleural effusions show no significant change. Diffuse interstitial infiltrates are also stable, consistent with diffuse interstitial edema. IMPRESSION: Increased left basilar atelectasis versus consolidation. Stable cardiomegaly, diffuse interstitial infiltrates, and small bilateral pleural effusions, consistent with congestive heart failure. Electronically Signed   By: Marlaine Hind M.D.   On: 12/06/2019 18:55   DG Thoracic Spine 2 View  Result Date:  12/06/2019 CLINICAL DATA:  Fall.  Thoracic back pain.  Initial encounter. EXAM: THORACIC SPINE 2 VIEWS COMPARISON:  None. FINDINGS: There is no evidence of thoracic spine fracture. Alignment is normal. No focal lytic or sclerotic bone lesions identified. Mild degenerative disc disease seen in the mid and lower thoracic spine, and cervical spine. Generalized osteopenia also noted. IMPRESSION: No acute findings. Degenerative spondylosis, as described above.  Osteopenia. Electronically Signed   By: Marlaine Hind M.D.   On: 12/06/2019 19:40   DG Lumbar Spine Complete  Result Date: 12/06/2019 CLINICAL DATA:  74 year old male with fall and back pain. EXAM: LUMBAR SPINE - COMPLETE 4+ VIEW COMPARISON:  Lumbar spine radiograph dated 11/16/2019. FINDINGS: Five lumbar type vertebra. There is no acute fracture  or subluxation of the lumbar spine. Multilevel mild chronic compression injury most prominent involving L1 and L4. Multilevel degenerative changes with bone spurring. Multilevel disc desiccation and vacuum phenomena. Grade 1 L3-L4 retrolisthesis and L4-L5 anterolisthesis. Atherosclerotic calcification of the abdominal aorta. IMPRESSION: No acute/traumatic lumbar spine pathology. Electronically Signed   By: Anner Crete M.D.   On: 12/06/2019 19:39   CT Head Wo Contrast  Result Date: 12/06/2019 CLINICAL DATA:  74 year old male with head trauma. EXAM: CT HEAD WITHOUT CONTRAST CT CERVICAL SPINE WITHOUT CONTRAST TECHNIQUE: Multidetector CT imaging of the head and cervical spine was performed following the standard protocol without intravenous contrast. Multiplanar CT image reconstructions of the cervical spine were also generated. COMPARISON:  Head CT dated 09/24/2015. FINDINGS: CT HEAD FINDINGS Brain: Moderate age-related atrophy and chronic microvascular ischemic changes. 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 There is no acute intracranial hemorrhage. No mass effect midline shift. No extra-axial fluid collection. Vascular:  No hyperdense vessel or unexpected calcification. Skull: Normal. Negative for fracture or focal lesion. Sinuses/Orbits: No acute finding. Other: None CT CERVICAL SPINE FINDINGS Alignment: No acute subluxation. Skull base and vertebrae: No acute fracture. Osteopenia. Soft tissues and spinal canal: No prevertebral fluid or swelling. No visible canal hematoma. Disc levels:  Multilevel degenerative changes. Upper chest: Emphysema. Partially visualized moderate to large left pleural effusion or hemothorax. Lobulated right pleural effusion or thickening. Increase in the size of the pleural effusions compared to the CT of 11/07/2019. Other: Fracture of the left clavicular head, acute or possibly subacute new since the prior CT of 11/07/2019. IMPRESSION: 1. No acute intracranial hemorrhage. 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 2.  acute/traumatic cervical spine pathology. 3. Fracture of the left clavicular head, acute or possibly subacute new since the prior CT of 11/07/2019. 4. Partially visualized moderate to large left pleural effusion or hemothorax. Increase in the size of the pleural effusions compared to the CT of 11/07/2019. Electronically Signed   By: Anner Crete M.D.   On: 12/06/2019 20:02   CT Cervical Spine Wo Contrast  Result Date: 12/06/2019 CLINICAL DATA:  74 year old male with head trauma. EXAM: CT HEAD WITHOUT CONTRAST CT CERVICAL SPINE WITHOUT CONTRAST TECHNIQUE: Multidetector CT imaging of the head and cervical spine was performed following the standard protocol without intravenous contrast. Multiplanar CT image reconstructions of the cervical spine were also generated. COMPARISON:  Head CT dated 09/24/2015. FINDINGS: CT HEAD FINDINGS Brain: Moderate age-related atrophy and chronic microvascular ischemic changes. 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 There is no acute intracranial hemorrhage. No mass effect midline shift. No extra-axial fluid  collection. Vascular: No hyperdense vessel or unexpected calcification. Skull: Normal. Negative for fracture or focal lesion. Sinuses/Orbits: No acute finding. Other: None CT CERVICAL SPINE FINDINGS Alignment: No acute subluxation. Skull base and vertebrae: No acute fracture. Osteopenia. Soft tissues and spinal canal: No prevertebral fluid or swelling. No visible canal hematoma. Disc levels:  Multilevel degenerative changes. Upper chest: Emphysema. Partially visualized moderate to large left pleural effusion or hemothorax. Lobulated right pleural effusion or thickening. Increase in the size of the pleural effusions compared to the CT of 11/07/2019. Other: Fracture of the left clavicular head, acute or possibly subacute new since the prior CT of 11/07/2019. IMPRESSION: 1. No acute intracranial hemorrhage. 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 2.  acute/traumatic cervical spine pathology. 3. Fracture of the left clavicular head, acute or possibly subacute new since the prior CT of 11/07/2019. 4. Partially visualized moderate  to large left pleural effusion or hemothorax. Increase in the size of the pleural effusions compared to the CT of 11/07/2019. Electronically Signed   By: Anner Crete M.D.   On: 12/06/2019 20:02   CT Angio Chest/Abd/Pel for Dissection W and/or Wo Contrast  Result Date: 12/06/2019 CLINICAL DATA:  Chest pain.  Concern for aortic dissection. EXAM: CT ANGIOGRAPHY CHEST, ABDOMEN AND PELVIS TECHNIQUE: Non-contrast CT of the chest was initially obtained. Multidetector CT imaging through the chest, abdomen and pelvis was performed using the standard protocol during bolus administration of intravenous contrast. Multiplanar reconstructed images and MIPs were obtained and reviewed to evaluate the vascular anatomy. CONTRAST:  111mL OMNIPAQUE IOHEXOL 350 MG/ML SOLN COMPARISON:  11/07/2019 FINDINGS: CTA CHEST FINDINGS Cardiovascular: There is  significant cardiac enlargement. The main pulmonary artery is dilated without evidence for an acute pulmonary embolism. There is no evidence for thoracic aortic dissection. Coronary artery calcifications are noted. The arch vessels are grossly patent. 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 This is essentially unchanged from prior study dated 11/07/2019. Mediastinum/Nodes: --there is mild mediastinal adenopathy. This is essentially stable from prior study. --No axillary lymphadenopathy. --No supraclavicular lymphadenopathy. --Normal thyroid gland. --The esophagus is unremarkable Lungs/Pleura: There are moderate severe emphysematous changes bilaterally. There is interlobular septal thickening. There are moderate-sized bilateral pleural effusions, left greater than right. The right-sided pleural effusion appears to be at least partially loculated. There is bronchial wall thickening and mild mucous plugging at the lung bases bilaterally. There are postsurgical changes in the left upper lobe likely related to prior wedge resection. Musculoskeletal: No chest wall abnormality. No acute or significant osseous findings. Review of the MIP images confirms the above findings. CTA ABDOMEN AND PELVIS FINDINGS VASCULAR Aorta: Normal caliber aorta without aneurysm, dissection, vasculitis or significant stenosis. Celiac: There is a mild loop shaped curvature of the celiac axis with associated narrowing. This can be seen in patients with median arcuate ligament syndrome. SMA: There is variant hepatic arterial anatomy with a replaced common hepatic artery arising from the SMA. Renals: Multiple bilateral renal arteries are noted, all of which appear to be grossly patent. IMA: Patent without evidence of aneurysm, dissection, vasculitis or significant stenosis. Inflow: Patent without evidence of aneurysm, dissection, vasculitis or significant stenosis. Veins: No obvious  venous abnormality within the limitations of this arterial phase study. Review of the MIP images confirms the above findings. NON-VASCULAR Hepatobiliary: The liver appears cirrhotic. Normal gallbladder.There is no biliary ductal dilation. Pancreas: Normal contours without ductal dilatation. No peripancreatic fluid collection. Spleen: Unremarkable. Adrenals/Urinary Tract: --Adrenal glands: Unremarkable. --Right kidney/ureter: No hydronephrosis or radiopaque kidney stones. --Left kidney/ureter: No hydronephrosis or radiopaque kidney stones. --Urinary bladder: The urinary bladder is significantly distended. Stomach/Bowel: --Stomach/Duodenum: No hiatal hernia or other gastric abnormality. Normal duodenal course and caliber. --Small bowel: Unremarkable. --Colon: There is an masslike appearance of the cecum with multiple small mildly enlarged regional lymph nodes. --Appendix: Normal. Lymphatic: --No retroperitoneal lymphadenopathy. --No mesenteric lymphadenopathy. --No pelvic or inguinal lymphadenopathy. Reproductive: Prostate gland is enlarged. Other: There is a mild amount presacral free fluid. There is no free air. There is mild body wall edema. Musculoskeletal. Old compression fractures are noted throughout the lumbar spine with multilevel degenerative changes. These are essentially stable from prior study. Review of the MIP images confirms the above findings. IMPRESSION: 1. No evidence for an acute vascular abnormality. There is no evidence for a thoracic aortic dissection. No evidence for an acute  pulmonary embolism. 2. 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 3. Profound cardiomegaly with findings of volume overload including small bilateral pleural effusions and body wall edema. The right-sided pleural effusion appears to be at least partially loculated. 4. Findings are concerning for a cecal mass as detailed above. Follow-up with nonemergent colonoscopy is  recommended. 5. Significantly distended urinary bladder. 6. Findings suspicious for underlying cirrhosis. 7. Relatively stable mediastinal and hilar adenopathy of unknown clinical significance. 8. Severe emphysematous changes with findings consistent with infectious or reactive bronchiolitis at the lung bases. Aortic Atherosclerosis (ICD10-I70.0) and Emphysema (ICD10-J43.9). Electronically Signed   By: Constance Holster M.D.   On: 12/06/2019 20:08        Scheduled Meds: . arformoterol  15 mcg Nebulization BID   Or  . umeclidinium bromide  1 puff Inhalation BID  . aspirin EC  81 mg Oral Daily  . atorvastatin  10 mg Oral Daily  . bethanechol  10 mg Oral TID  . furosemide  80 mg Intravenous BID  . insulin aspart  0-9 Units Subcutaneous TID WC  . pantoprazole  40 mg Oral Q0600  . sodium chloride flush  3 mL Intravenous Q12H  . tamsulosin  0.4 mg Oral QPC supper   Continuous Infusions:   LOS: 1 day    Time spent: 40 minutes    Irine Seal, MD [facility]   To contact the attending provider between 7A-7P or the covering provider during after hours 7P-7A, please log into the web site [email or web address] and access using universal [place] password for that web site. If you do not have the password, please call the hospital operator.  12/07/2019, 12:23 PM

## 2019-12-08 DIAGNOSIS — R55 Syncope and collapse: Secondary | ICD-10-CM

## 2019-12-08 DIAGNOSIS — L8990 Pressure ulcer of unspecified site, unspecified stage: Secondary | ICD-10-CM | POA: Insufficient documentation

## 2019-12-08 LAB — CBC WITH DIFFERENTIAL/PLATELET
Abs Immature Granulocytes: 0.03 10*3/uL (ref 0.00–0.07)
Basophils Absolute: 0.1 10*3/uL (ref 0.0–0.1)
Basophils Relative: 1 %
Eosinophils Absolute: 0.1 10*3/uL (ref 0.0–0.5)
Eosinophils Relative: 1 %
HCT: 35.6 % — ABNORMAL LOW (ref 39.0–52.0)
Hemoglobin: 10.2 g/dL — ABNORMAL LOW (ref 13.0–17.0)
Immature Granulocytes: 0 %
Lymphocytes Relative: 8 %
Lymphs Abs: 0.6 10*3/uL — ABNORMAL LOW (ref 0.7–4.0)
MCH: 23.9 pg — ABNORMAL LOW (ref 26.0–34.0)
MCHC: 28.7 g/dL — ABNORMAL LOW (ref 30.0–36.0)
MCV: 83.6 fL (ref 80.0–100.0)
Monocytes Absolute: 0.9 10*3/uL (ref 0.1–1.0)
Monocytes Relative: 12 %
Neutro Abs: 5.6 10*3/uL (ref 1.7–7.7)
Neutrophils Relative %: 78 %
Platelets: 286 10*3/uL (ref 150–400)
RBC: 4.26 MIL/uL (ref 4.22–5.81)
RDW: 26.6 % — ABNORMAL HIGH (ref 11.5–15.5)
WBC: 7.2 10*3/uL (ref 4.0–10.5)
nRBC: 0 % (ref 0.0–0.2)

## 2019-12-08 LAB — BASIC METABOLIC PANEL
Anion gap: 9 (ref 5–15)
BUN: 17 mg/dL (ref 8–23)
CO2: 31 mmol/L (ref 22–32)
Calcium: 8.3 mg/dL — ABNORMAL LOW (ref 8.9–10.3)
Chloride: 99 mmol/L (ref 98–111)
Creatinine, Ser: 1.02 mg/dL (ref 0.61–1.24)
GFR calc Af Amer: >60 mL/min (ref >60)
GFR calc non Af Amer: >60 mL/min (ref >60)
Glucose, Bld: 151 mg/dL — ABNORMAL HIGH (ref 70–99)
Potassium: 3.5 mmol/L (ref 3.5–5.1)
Sodium: 139 mmol/L (ref 135–145)

## 2019-12-08 LAB — GLUCOSE, CAPILLARY
Glucose-Capillary: 147 mg/dL — ABNORMAL HIGH (ref 70–99)
Glucose-Capillary: 159 mg/dL — ABNORMAL HIGH (ref 70–99)
Glucose-Capillary: 141 mg/dL — ABNORMAL HIGH (ref 70–99)
Glucose-Capillary: 164 mg/dL — ABNORMAL HIGH (ref 70–99)

## 2019-12-08 LAB — MAGNESIUM: Magnesium: 1.8 mg/dL (ref 1.7–2.4)

## 2019-12-08 MED ORDER — CARVEDILOL 3.125 MG PO TABS
3.1250 mg | ORAL_TABLET | Freq: Two times a day (BID) | ORAL | Status: DC
  Administered 2019-12-08 (×2): 3.125 mg via ORAL
  Filled 2019-12-08 (×2): qty 1

## 2019-12-08 MED ORDER — IBUPROFEN 200 MG PO TABS
400.0000 mg | ORAL_TABLET | Freq: Once | ORAL | Status: AC
  Administered 2019-12-08: 400 mg via ORAL
  Filled 2019-12-08: qty 2

## 2019-12-08 MED ORDER — ACETAMINOPHEN 500 MG PO TABS
500.0000 mg | ORAL_TABLET | Freq: Three times a day (TID) | ORAL | Status: DC
  Administered 2019-12-08 – 2019-12-12 (×14): 500 mg via ORAL
  Filled 2019-12-08 (×14): qty 1

## 2019-12-08 MED ORDER — POTASSIUM CHLORIDE CRYS ER 20 MEQ PO TBCR
40.0000 meq | EXTENDED_RELEASE_TABLET | Freq: Two times a day (BID) | ORAL | Status: AC
  Administered 2019-12-08 (×2): 40 meq via ORAL
  Filled 2019-12-08 (×3): qty 2

## 2019-12-08 MED ORDER — MAGNESIUM SULFATE 2 GM/50ML IV SOLN
2.0000 g | Freq: Once | INTRAVENOUS | Status: AC
  Administered 2019-12-08: 2 g via INTRAVENOUS
  Filled 2019-12-08: qty 50

## 2019-12-08 NOTE — Progress Notes (Signed)
Cardiology Progress Note  Patient ID: [name] MRN: [id number] DOB: [birth year] Date of Encounter: 12/08/2019  Primary Cardiologist: Candee Furbish, MD  Subjective   Chief Complaint: SOB  HPI: Reports he still feels weak. Still SOB. No further falls. Still has intermittent central sharp chest pain. Troponin negative. Good urine output.   ROS:  All other ROS reviewed and negative. Pertinent positives noted in the HPI.     Inpatient Medications  Scheduled Meds: . arformoterol  15 mcg Nebulization BID   Or  . umeclidinium bromide  1 puff Inhalation BID  . aspirin EC  81 mg Oral Daily  . atorvastatin  10 mg Oral Daily  . bethanechol  10 mg Oral TID  . carvedilol  3.125 mg Oral BID WC  . citalopram  10 mg Oral Daily  . furosemide  80 mg Intravenous BID  . insulin aspart  0-9 Units Subcutaneous TID WC  . pantoprazole  40 mg Oral Q0600  . potassium chloride  40 mEq Oral BID  . sodium chloride flush  3 mL Intravenous Q12H  . tamsulosin  0.4 mg Oral QPC supper   Continuous Infusions: . magnesium sulfate bolus IVPB     PRN Meds: acetaminophen **OR** acetaminophen, albuterol, ondansetron **OR** ondansetron (ZOFRAN) IV   Vital Signs   Vitals:   12/08/19 0545 12/08/19 0729 12/08/19 0747 12/08/19 0803  BP: 101/62 105/63    Pulse: 73 74 77   Resp: 15 18    Temp: 98.7 F (37.1 C) 97.9 F (36.6 C)    TempSrc: Oral Oral    SpO2: 91% 96%  95%  Weight: 69.7 kg     Height:        Intake/Output Summary (Last 24 hours) at 12/08/2019 0806 Last data filed at 12/08/2019 0700 Gross per 24 hour  Intake 1525 ml  Output 3505 ml  Net -1980 ml   Last 3 Weights 12/08/2019 12/07/2019 12/03/2019  Weight (lbs) 153 lb 9.6 oz 155 lb 10.3 oz 157 lb  Weight (kg) 69.673 kg 70.6 kg 71.215 kg      Telemetry  Overnight telemetry shows V paced ~75 bpm, which I personally reviewed.   ECG  The most recent ECG shows BiV paced 73 bpm, which I personally reviewed.   Physical Exam   Vitals:    12/08/19 0545 12/08/19 0729 12/08/19 0747 12/08/19 0803  BP: 101/62 105/63    Pulse: 73 74 77   Resp: 15 18    Temp: 98.7 F (37.1 C) 97.9 F (36.6 C)    TempSrc: Oral Oral    SpO2: 91% 96%  95%  Weight: 69.7 kg     Height:         Intake/Output Summary (Last 24 hours) at 12/08/2019 0806 Last data filed at 12/08/2019 0700 Gross per 24 hour  Intake 1525 ml  Output 3505 ml  Net -1980 ml    Last 3 Weights 12/08/2019 12/07/2019 12/03/2019  Weight (lbs) 153 lb 9.6 oz 155 lb 10.3 oz 157 lb  Weight (kg) 69.673 kg 70.6 kg 71.215 kg    Body mass index is 20.27 kg/m.   General: frail Head: Atraumatic, normal size  Eyes: PEERLA, EOMI  Neck: Supple, +JVD Endocrine: No thryomegaly Cardiac: Normal S1, S2; RRR; no murmurs, rubs, or gallops Lungs: rales at lung bases  Abd: Soft, nontender, no hepatomegaly  Ext: 2+ pitting edema  Musculoskeletal: No deformities, BUE and BLE strength normal and equal Skin: Warm and dry, no rashes   Neuro:  Alert and oriented to person, place, time, and situation, CNII-XII grossly intact, no focal deficits  Psych: Normal mood and affect   Labs  High Sensitivity Troponin:   Recent Labs  Lab 11/15/19 1123 11/15/19 1238 11/15/19 1636 12/06/19 1824 12/06/19 2140  TROPONINIHS 19* 18* 21* 19* 19*     Cardiac EnzymesNo results for input(s): TROPONINI in the last 168 hours. No results for input(s): TROPIPOC in the last 168 hours.  Chemistry Recent Labs  Lab 12/06/19 1824 12/07/19 0029 12/08/19 0415  NA 142 140 139  K 4.9 3.7 3.5  CL 102 99 99  CO2 29 30 31   GLUCOSE 120* 173* 151*  BUN 22 20 17   CREATININE 1.18 1.10 1.02  CALCIUM 8.6* 8.5* 8.3*  GFRNONAA >60 >60 >60  GFRAA >60 >60 >60  ANIONGAP 11 11 9     Hematology Recent Labs  Lab 12/06/19 1824 12/07/19 0029 12/08/19 0415  WBC 6.8 6.2 7.2  RBC 4.29 4.43 4.26  HGB 10.2* 10.3* 10.2*  HCT 36.6* 37.5* 35.6*  MCV 85.3 84.7 83.6  MCH 23.8* 23.3* 23.9*  MCHC 27.9* 27.5* 28.7*  RDW 27.4*  27.3* 26.6*  PLT 295 305 286   BNP Recent Labs  Lab 12/07/19 0029  BNP 1,365.1*    DDimer No results for input(s): DDIMER in the last 168 hours.   Radiology  DG Chest 2 View  Result Date: 12/06/2019 CLINICAL DATA:  Chest pain and shortness of breath. Previous myocardial infarct congestive heart failure. EXAM: CHEST - 2 VIEW COMPARISON:  11/17/2019 FINDINGS: Moderate cardiac enlargement shows no significant change. Transvenous pacemaker remains in appropriate position. Pulmonary emphysema again noted. Increased atelectasis or consolidation is seen in the retrocardiac left lower lobe since prior study. Small bilateral pleural effusions show no significant change. Diffuse interstitial infiltrates are also stable, consistent with diffuse interstitial edema. IMPRESSION: Increased left basilar atelectasis versus consolidation. Stable cardiomegaly, diffuse interstitial infiltrates, and small bilateral pleural effusions, consistent with congestive heart failure. Electronically Signed   By: Marlaine Hind M.D.   On: 12/06/2019 18:55   DG Thoracic Spine 2 View  Result Date: 12/06/2019 CLINICAL DATA:  Fall.  Thoracic back pain.  Initial encounter. EXAM: THORACIC SPINE 2 VIEWS COMPARISON:  None. FINDINGS: There is no evidence of thoracic spine fracture. Alignment is normal. No focal lytic or sclerotic bone lesions identified. Mild degenerative disc disease seen in the mid and lower thoracic spine, and cervical spine. Generalized osteopenia also noted. IMPRESSION: No acute findings. Degenerative spondylosis, as described above.  Osteopenia. Electronically Signed   By: Marlaine Hind M.D.   On: 12/06/2019 19:40   DG Lumbar Spine Complete  Result Date: 12/06/2019 CLINICAL DATA:  74 year old male with fall and back pain. EXAM: LUMBAR SPINE - COMPLETE 4+ VIEW COMPARISON:  Lumbar spine radiograph dated 11/16/2019. FINDINGS: Five lumbar type vertebra. There is no acute fracture or subluxation of the lumbar spine.  Multilevel mild chronic compression injury most prominent involving L1 and L4. Multilevel degenerative changes with bone spurring. Multilevel disc desiccation and vacuum phenomena. Grade 1 L3-L4 retrolisthesis and L4-L5 anterolisthesis. Atherosclerotic calcification of the abdominal aorta. IMPRESSION: No acute/traumatic lumbar spine pathology. Electronically Signed   By: Anner Crete M.D.   On: 12/06/2019 19:39   CT Head Wo Contrast  Result Date: 12/06/2019 CLINICAL DATA:  74 year old male with head trauma. EXAM: CT HEAD WITHOUT CONTRAST CT CERVICAL SPINE WITHOUT CONTRAST TECHNIQUE: Multidetector CT imaging of the head and cervical spine was performed following the standard protocol without intravenous contrast.  Multiplanar CT image reconstructions of the cervical spine were also generated. COMPARISON:  Head CT dated 09/24/2015. FINDINGS: CT HEAD FINDINGS Brain: Moderate age-related atrophy and chronic microvascular ischemic changes. 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 There is no acute intracranial hemorrhage. No mass effect midline shift. No extra-axial fluid collection. Vascular: No hyperdense vessel or unexpected calcification. Skull: Normal. Negative for fracture or focal lesion. Sinuses/Orbits: No acute finding. Other: None CT CERVICAL SPINE FINDINGS Alignment: No acute subluxation. Skull base and vertebrae: No acute fracture. Osteopenia. Soft tissues and spinal canal: No prevertebral fluid or swelling. No visible canal hematoma. Disc levels:  Multilevel degenerative changes. Upper chest: Emphysema. Partially visualized moderate to large left pleural effusion or hemothorax. Lobulated right pleural effusion or thickening. Increase in the size of the pleural effusions compared to the CT of 11/07/2019. Other: Fracture of the left clavicular head, acute or possibly subacute new since the prior CT of 11/07/2019. IMPRESSION: 1. No acute intracranial hemorrhage. Age-related atrophy and chronic microvascular ischemic  changes and old left temporal infarct. 2.  acute/traumatic cervical spine pathology. 3. Fracture of the left clavicular head, acute or possibly subacute new since the prior CT of 11/07/2019. 4. Partially visualized moderate to large left pleural effusion or hemothorax. Increase in the size of the pleural effusions compared to the CT of 11/07/2019. Electronically Signed   By: Anner Crete M.D.   On: 12/06/2019 20:02   CT Cervical Spine Wo Contrast  Result Date: 12/06/2019 CLINICAL DATA:  74 year old male with head trauma. EXAM: CT HEAD WITHOUT CONTRAST CT CERVICAL SPINE WITHOUT CONTRAST TECHNIQUE: Multidetector CT imaging of the head and cervical spine was performed following the standard protocol without intravenous contrast. Multiplanar CT image reconstructions of the cervical spine were also generated. COMPARISON:  Head CT dated 09/24/2015. FINDINGS: CT HEAD FINDINGS Brain: Moderate age-related atrophy and chronic microvascular ischemic changes. 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 There is no acute intracranial hemorrhage. No mass effect midline shift. No extra-axial fluid collection. Vascular: No hyperdense vessel or unexpected calcification. Skull: Normal. Negative for fracture or focal lesion. Sinuses/Orbits: No acute finding. Other: None CT CERVICAL SPINE FINDINGS Alignment: No acute subluxation. Skull base and vertebrae: No acute fracture. Osteopenia. Soft tissues and spinal canal: No prevertebral fluid or swelling. No visible canal hematoma. Disc levels:  Multilevel degenerative changes. Upper chest: Emphysema. Partially visualized moderate to large left pleural effusion or hemothorax. Lobulated right pleural effusion or thickening. Increase in the size of the pleural effusions compared to the CT of 11/07/2019. Other: Fracture of the left clavicular head, acute or possibly subacute new since the prior CT of 11/07/2019. IMPRESSION: 1. No acute intracranial hemorrhage. Age-related atrophy and chronic  microvascular ischemic changes and old left temporal infarct. 2.  acute/traumatic cervical spine pathology. 3. Fracture of the left clavicular head, acute or possibly subacute new since the prior CT of 11/07/2019. 4. Partially visualized moderate to large left pleural effusion or hemothorax. Increase in the size of the pleural effusions compared to the CT of 11/07/2019. Electronically Signed   By: Anner Crete M.D.   On: 12/06/2019 20:02   CT Angio Chest/Abd/Pel for Dissection W and/or Wo Contrast  Result Date: 12/06/2019 CLINICAL DATA:  Chest pain.  Concern for aortic dissection. EXAM: CT ANGIOGRAPHY CHEST, ABDOMEN AND PELVIS TECHNIQUE: Non-contrast CT of the chest was initially obtained. Multidetector CT imaging through the chest, abdomen and pelvis was performed using the standard protocol during bolus administration of intravenous contrast. Multiplanar reconstructed images and MIPs were obtained and reviewed to evaluate the  vascular anatomy. CONTRAST:  146mL OMNIPAQUE IOHEXOL 350 MG/ML SOLN COMPARISON:  11/07/2019 FINDINGS: CTA CHEST FINDINGS Cardiovascular: There is significant cardiac enlargement. The main pulmonary artery is dilated without evidence for an acute pulmonary embolism. There is no evidence for thoracic aortic dissection. Coronary artery calcifications are noted. The arch vessels are grossly patent. 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 This is essentially unchanged from prior study dated 11/07/2019. Mediastinum/Nodes: --there is mild mediastinal adenopathy. This is essentially stable from prior study. --No axillary lymphadenopathy. --No supraclavicular lymphadenopathy. --Normal thyroid gland. --The esophagus is unremarkable Lungs/Pleura: There are moderate severe emphysematous changes bilaterally. There is interlobular septal thickening. There are moderate-sized bilateral pleural effusions, left greater than right. The  right-sided pleural effusion appears to be at least partially loculated. There is bronchial wall thickening and mild mucous plugging at the lung bases bilaterally. There are postsurgical changes in the left upper lobe likely related to prior wedge resection. Musculoskeletal: No chest wall abnormality. No acute or significant osseous findings. Review of the MIP images confirms the above findings. CTA ABDOMEN AND PELVIS FINDINGS VASCULAR Aorta: Normal caliber aorta without aneurysm, dissection, vasculitis or significant stenosis. Celiac: There is a mild loop shaped curvature of the celiac axis with associated narrowing. This can be seen in patients with median arcuate ligament syndrome. SMA: There is variant hepatic arterial anatomy with a replaced common hepatic artery arising from the SMA. Renals: Multiple bilateral renal arteries are noted, all of which appear to be grossly patent. IMA: Patent without evidence of aneurysm, dissection, vasculitis or significant stenosis. Inflow: Patent without evidence of aneurysm, dissection, vasculitis or significant stenosis. Veins: No obvious venous abnormality within the limitations of this arterial phase study. Review of the MIP images confirms the above findings. NON-VASCULAR Hepatobiliary: The liver appears cirrhotic. Normal gallbladder.There is no biliary ductal dilation. Pancreas: Normal contours without ductal dilatation. No peripancreatic fluid collection. Spleen: Unremarkable. Adrenals/Urinary Tract: --Adrenal glands: Unremarkable. --Right kidney/ureter: No hydronephrosis or radiopaque kidney stones. --Left kidney/ureter: No hydronephrosis or radiopaque kidney stones. --Urinary bladder: The urinary bladder is significantly distended. Stomach/Bowel: --Stomach/Duodenum: No hiatal hernia or other gastric abnormality. Normal duodenal course and caliber. --Small bowel: Unremarkable. --Colon: There is an masslike appearance of the cecum with multiple small mildly enlarged  regional lymph nodes. --Appendix: Normal. Lymphatic: --No retroperitoneal lymphadenopathy. --No mesenteric lymphadenopathy. --No pelvic or inguinal lymphadenopathy. Reproductive: Prostate gland is enlarged. Other: There is a mild amount presacral free fluid. There is no free air. There is mild body wall edema. Musculoskeletal. Old compression fractures are noted throughout the lumbar spine with multilevel degenerative changes. These are essentially stable from prior study. Review of the MIP images confirms the above findings. IMPRESSION: 1. No evidence for an acute vascular abnormality. There is no evidence for a thoracic aortic dissection. No evidence for an acute pulmonary embolism. 2. 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 3. Profound cardiomegaly with findings of volume overload including small bilateral pleural effusions and body wall edema. The right-sided pleural effusion appears to be at least partially loculated. 4. Findings are concerning for a cecal mass as detailed above. Follow-up with nonemergent colonoscopy is recommended. 5. Significantly distended urinary bladder. 6. Findings suspicious for underlying cirrhosis. 7. Relatively stable mediastinal and hilar adenopathy of unknown clinical significance. 8. Severe emphysematous changes with findings consistent with infectious or reactive bronchiolitis at the lung bases. Aortic Atherosclerosis (ICD10-I70.0) and Emphysema (ICD10-J43.9). Electronically Signed   By: Harrell Gave  Green M.D.   On: 12/06/2019 20:08    Cardiac Studies  TTE 11/14/2019 [name] ECHO COMPLETE WO IMAGING ENHANCING AGENT Order# [id number] Reading physician: Fay Records, MD Ordering physician: Truddie Hidden, MD Study date: 11/14/19  ECHOCARDIOGRAM COMPLETE (Accession [id number]) (Order [id number]) Echocardiography Date: 11/13/2019 Department: [facility] Released By/Authorizing: Truddie Hidden, MD  (auto-released)  ECHOCARDIOGRAM COMPLETE Order #: [id number] Accession #: [id number] Patient Info  Patient name: [name]  MRN: [id number]  Age: 74 y.o.  Sex: male  MyChart Results Release  MyChart Status: Pending Results Release  Vitals  BP Height Weight BSA (Calculated - sq m)  110/80 6\' 1"  (1.854 m) 71.2 kg 1.93 sq meters  Order-Level Documents:  Scan on 11/14/2019 6:50 PM by Default, Provider, MD     Study Result    ECHOCARDIOGRAM REPORT       Patient Name:  [name] Date of Exam: 11/14/2019  Medical Rec #: [id number]   Height:    73.0 in  Accession #:  [id number]  Weight:    159.1 lb  Date of Birth: Mar 03, 1946  BSA:     1.952 m  Patient Age:  74 years   BP:      119/67 mmHg  Patient Gender: M       HR:      67 bpm.  Exam Location: Inpatient   Procedure: 2D Echo and Intracardiac Opacification Agent   Indications:  CHF-Acute Systolic 660.60 / O45.99    History:    Patient has prior history of Echocardiogram examinations,  most         recent 06/07/2019. Cardiomyopathy, ICD (implantable         cardioverter-defibrillator), S/P MVR (mitral valve  replacement),         COPD, Stroke and Pulmonary HTN; Risk Factors:Hypertension  and         Dyslipidemia. Elevated troponin.    Sonographer:  Vikki Ports Turrentine  Referring Phys: [id number] Truddie Hidden     Sonographer Comments: Technically difficult study due to poor echo  windows.  IMPRESSIONS    1. LVEF is severely depressed with diffuse hypokinesis, worse in the  septal and apical walls Compared to report from Nov 2020 echo, LVEF is  worse (apical views foreshortened in 2020 exam which makes assessment  difficult). Left ventricular ejection  fraction, by estimation, is 25 to 30%. The left ventricle has severely  decreased function. The left ventricle demonstrates global hypokinesis.  There is mild left  ventricular hypertrophy. Left ventricular diastolic  parameters are indeterminate.  2. Right ventricular systolic function is moderately reduced. The right  ventricular size is severely enlarged. There is moderately elevated  pulmonary artery systolic pressure.  3. Left atrial size was severely dilated.  4. Right atrial size was severely dilated.  5. A 33 mm Medtronic Mosai bioprosthesisiis present Peak and mean  gradients through the vave are 15 and 6 mm Hg respectively, both lower  than previous echo report in 2020.Marland Kitchen The mitral valve has been  repaired/replaced. Trivial mitral valve regurgitation.  6. The aortic valve is abnormal. Aortic valve regurgitation is not  visualized. Mild to moderate aortic valve sclerosis/calcification is  present, without any evidence of aortic stenosis.  7. Aortic root is 51 mm Previous echo measured echo above this; current  exam did not image that high.. Aortic dilatation noted. Aneurysm of the  aortic root. There is moderate to severe dilatation of the aortic root  measuring 51 mm.  8. The inferior vena cava is dilated in size with <50% respiratory  variability, suggesting right atrial pressure of 15 mmHg.    CTA 12/06/2019 IMPRESSION: 1. No evidence for an acute vascular abnormality. There is no evidence for a thoracic aortic dissection. No evidence for an acute pulmonary embolism. 2. 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 3. Profound cardiomegaly with findings of volume overload including small bilateral pleural effusions and body wall edema. The right-sided pleural effusion appears to be at least partially loculated. 4. Findings are concerning for a cecal mass as detailed above. Follow-up with nonemergent colonoscopy is recommended. 5. Significantly distended urinary bladder. 6. Findings suspicious for underlying cirrhosis. 7. Relatively stable mediastinal and hilar adenopathy of unknown  clinical significance. 8. Severe emphysematous changes with findings consistent with infectious or reactive bronchiolitis at the lung bases.  Patient Profile  [name] is a 74 y.o. male with CHF (EF 25%, end-stage per clinic notes as not tolerating meds), Afib s/p AVN ablation and BiV ICD, severe MR, LAA clipping, severe COPD, frailty/falls admitted 12/06/2019 after a fall that was likely orthostatic. No LOC reported.   Assessment & Plan   1. Fall -fell after leaving bathroom. Got dizzy and blurred vision. Did not have actual syncope. Troponin negative. EKG with BiV paced rhythm. CTA without dissection.  -no further work-up.  -fall have been more frequent and wife cannot take care of him at home -palliative/hospice care consults recommended  2. Systolic HF, EF 87%, End-Staged Cardiomyopathy/Severe MR s/p MVR -well documented he has not been tolerating GDMT due to hypotension. Not candidate for advanced therapies due to severe COPD/frailty. Also now with a cecal mass on CT scan.  -volume up; continue lasix 80 mg BID. Had good urine output yesterday. -will try to add coreg 3.125 mg BID today -would recommend palliative/hospice consults. I discussed this with wife yesterday and she is interested as she cannot care for him at home per her report.   3. Chest pain -sharp intermittent. Atypical. Troponin negative. Non-cardiac and no further work-up.   4. Permanent Afib s/p AVN ablation with BiV ppm -no issues -no AC due to prior cerebral hemorrhage and s.p LAA clip   For questions or updates, please contact [place] Please consult [email or web address] for contact info under   Time Spent with Patient: I have spent a total of 25 minutes with patient reviewing hospital notes, telemetry, EKGs, labs and examining the patient as well as establishing an assessment and plan that was discussed with the patient.  > 50% of time was spent in direct patient care.    Signed, Addison Naegeli. Audie Box, [place]  12/08/2019 8:06 AM

## 2019-12-08 NOTE — Evaluation (Signed)
Physical Therapy Evaluation Patient Details Name: [name] MRN: [id number] DOB: [birth year] Today's Date: 12/08/2019   History of Present Illness  [name] is a 74 y.o. male with medical history significant for end-stage chronic combined systolic and diastolic CHF (EF 70-35%) s/p BiV ICD, chronic severe COPD/emphysema with chronic respiratory failure on 5 L of home O2 via [place], permanent A. fib on aspirin alone (Hx of ICH while on Coumadin) s/p AV nodal ablation, severe mitral regurgitation s/p bioprosthetic mitral valve replacement, pulmonary hypertension, non-small cell left lung cancer s/p wedge resection, type 2 diabetes, hypertension presenting to the ED on 12/06/19 due to complaints of chest pain and syncope - admitted for further work-up.     Clinical Impression  Patient received in supine motivated to participate with PT. Patient reports Mod I at home for ADLs and mobility without required use of DME. Patient today on 4L O2 with sats ranging from 85-95% with activity. Patient requiring up to Min A to min guard for safety and balance with all dynamic mobility activities. Patient with limited tolerance to activity with patient fatiguing quickly in session. Currently, due to limited caregiver support and reduced functional mobility, will recommend SNF - if patient declines will need HHPT. PT to follow acutely.     Follow Up Recommendations SNF;Supervision for mobility/OOB(if patient declines will need HHPT)    Equipment Recommendations  (defer)    Recommendations for Other Services       Precautions / Restrictions Precautions Precautions: Fall;Other (comment) Precaution Comments: watch SPO2 Restrictions Weight Bearing Restrictions: No      Mobility  Bed Mobility Overal bed mobility: Needs Assistance Bed Mobility: Supine to Sit     Supine to sit: Supervision;Min guard     General bed mobility comments: increased time and effort  Transfers Overall transfer level:  Needs assistance Equipment used: None Transfers: Sit to/from Stand Sit to Stand: Min assist         General transfer comment: light Min A to stand at bedside  Ambulation/Gait Ambulation/Gait assistance: Min guard Gait Distance (Feet): 100 Feet Assistive device: None Gait Pattern/deviations: Step-to pattern;Step-through pattern;Decreased stride length;Trunk flexed;Drifts right/left Gait velocity: decreased   General Gait Details: reports mild dizziness with ambulation; cueing throughout for safety and obstacle navigation  Stairs            Wheelchair Mobility    Modified Rankin (Stroke Patients Only)       Balance Overall balance assessment: Needs assistance Sitting-balance support: No upper extremity supported;Feet supported;Feet unsupported Sitting balance-Leahy Scale: Good     Standing balance support: No upper extremity supported;During functional activity Standing balance-Leahy Scale: Fair                               Pertinent Vitals/Pain Pain Assessment: Faces Faces Pain Scale: Hurts a little bit Pain Location: R pec region Pain Descriptors / Indicators: Aching;Discomfort Pain Intervention(s): Limited activity within patient's tolerance;Monitored during session;Repositioned    Home Living Family/patient expects to be discharged to:: Unsure Living Arrangements: Spouse/significant other Available Help at Discharge: Family Type of Home: House Home Access: Stairs to enter Entrance Stairs-Rails: Can reach both Entrance Stairs-Number of Steps: 3 Home Layout: One level Home Equipment: None Additional Comments: Wears 5L O2 at home    Prior Function Level of Independence: Independent         Comments: Independent, 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)     Hand  Dominance   Dominant Hand: Left    Extremity/Trunk Assessment   Upper Extremity Assessment Upper Extremity Assessment: Defer to OT evaluation    Lower Extremity  Assessment Lower Extremity Assessment: Generalized weakness    Cervical / Trunk Assessment Cervical / Trunk Assessment: Normal  Communication   Communication: HOH  Cognition Arousal/Alertness: Awake/alert Behavior During Therapy: WFL for tasks assessed/performed Overall Cognitive Status: Within Functional Limits for tasks assessed                                        General Comments General comments (skin integrity, edema, etc.): BP remaining stable throughout session; patient reporting mild dizziness; SpO2 85-95% on 4L O2 with education to breathe in through nose    Exercises     Assessment/Plan    PT Assessment Patient needs continued PT services  PT Problem List Decreased strength;Decreased activity tolerance;Decreased balance;Decreased mobility;Decreased knowledge of use of DME;Decreased safety awareness       PT Treatment Interventions DME instruction;Gait training;Stair training;Functional mobility training;Therapeutic activities;Therapeutic exercise;Balance training;Neuromuscular re-education;Patient/family education    PT Goals (Current goals can be found in the Care Plan section)  Acute Rehab PT Goals Patient Stated Goal: none stated PT Goal Formulation: With patient Time For Goal Achievement: 12/22/19 Potential to Achieve Goals: Good    Frequency Min 2X/week   Barriers to discharge Decreased caregiver support      Co-evaluation               AM-PAC PT "6 Clicks" Mobility  Outcome Measure Help needed turning from your back to your side while in a flat bed without using bedrails?: A Little Help needed moving from lying on your back to sitting on the side of a flat bed without using bedrails?: A Little Help needed moving to and from a bed to a chair (including a wheelchair)?: A Little Help needed standing up from a chair using your arms (e.g., wheelchair or bedside chair)?: A Little Help needed to walk in hospital room?: A Little Help  needed climbing 3-5 steps with a railing? : A Lot 6 Click Score: 17    End of Session Equipment Utilized During Treatment: Gait belt;Oxygen Activity Tolerance: Patient tolerated treatment well;Patient limited by fatigue Patient left: in chair;with call bell/phone within reach;with chair alarm set Nurse Communication: Mobility status PT Visit Diagnosis: Unsteadiness on feet (R26.81);Other abnormalities of gait and mobility (R26.89);Muscle weakness (generalized) (M62.81)    Time: 1040-1106 PT Time Calculation (min) (ACUTE ONLY): 26 min   Charges:   PT Evaluation $PT Eval High Complexity: 1 High PT Treatments $Gait Training: 8-22 mins        Milana Na, PT, DPT Supplemental Physical Therapist 12/08/19 12:12 PM Office: [phone]

## 2019-12-08 NOTE — Progress Notes (Signed)
Patient ambulated to BR with oxygen at 5L [place], oxygen saturation decreased to 60%. Patient denies SOB, resp rate 20.  Returned to bed, oxygen increased to 6L [place],  Oxygen saturation slowly increased to 88-90%.  Will continue to monitor.

## 2019-12-08 NOTE — Progress Notes (Signed)
PROGRESS NOTE    [name]  CBJ:[id number] DOB: [birth year] DOA: 12/06/2019 PCP: [name], NP    Chief Complaint  Patient presents with  . Fall  . Shortness of Breath    Brief Narrative: (Start on day 1 of progress note - keep it brief and live) HPI per Dr. Garrel Ridgel is a 74 y.o. male with medical history significant for end-stage chronic combined systolic and diastolic CHF (EF 16-07%) s/p BiV ICD, chronic severe COPD/emphysema with chronic respiratory failure on 5 L of home O2 via [place], permanent A. fib on aspirin alone (Hx of ICH while on Coumadin) s/p AV nodal ablation, severe mitral regurgitation s/p bioprosthetic mitral valve replacement, pulmonary hypertension, non-small cell left lung cancer s/p wedge resection, type 2 diabetes, hypertension, and hyperlipidemia who presents to the ED for evaluation of chest pain.  Patient recently hospitalized from 11/13/2019-11/25/2019 for acute on chronic CHF.  He was diuresed while in hospital.  Therapy evaluation recommended SNF however patient declined and he was discharged to home with home health and increase O2 requirement to 5 L via [place].  He was discharged on Lasix 60 mg twice daily.  Patient states around 74 AM earlier today (12/06/2019) he developed significant chest pain across his chest when he was walking down the hall.  He went into the kitchen and says he blacked out and fell to the ground.  He denied any significant injury.  He has been having continued chest pain since.  He came to the ED for further evaluation.  He reports continued swelling in both of his lower extremities.  He denies any significant change in his breathing.  He denies any cough.  He denies any subjective fevers, chills, diaphoresis, abdominal pain, or dysuria.  ED Course:  Initial vitals showed BP 105/61, pulse 70, RR 18, temp 97.2 Fahrenheit, SPO2 96% on 15 L NRB.  Patient was weaned down to 6 L O2 via .  Labs are notable for WBC 6.8, hemoglobin  10.2, platelets 295,000, sodium 142, potassium 4.9, bicarb 29, BUN 22, creatinine 1.18, serum glucose 120, magnesium 1.9, high-sensitivity troponin I 19.  EKG shows V paced rhythm.  SARS-CoV-2 PCR is negative.  2 view chest x-ray shows stable cardiomegaly, diffuse interstitial infiltrates, small bilateral pleural effusions, and increased left basilar atelectasis versus consolidation.  Thoracic spine x-ray is negative for acute findings.  Lumbar spine x-ray is negative for acute/traumatic lumbar spine pathology.  CT head and cervical spine without contrast are negative for acute intracranial hemorrhage or acute/traumatic cervical spine pathology.  Acute or possibly subacute fracture of the left clavicular head is noted.  CT chest/abdomen/pelvis dissection study is negative for evidence of acute vascular abnormality, thoracic aortic dissection, or PE.  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  Small bilateral pleural effusions with partially loculated right-sided pleural effusion noted.  Masslike appearance of the cecum with multiple small mildly enlarged regional lymph nodes noted.  Distended urinary bladder and findings of suspected underlying cirrhosis are seen.  Severe emphysematous changes with infectious or reactive bronchiolitis at the lung bases also reported.  The hospitalist service was consulted to me for further evaluation and management.   Assessment & Plan:   Principal Problem:   Syncope Active Problems:   COPD GOLD II   Hypertension associated with diabetes ([place])   Hyperlipidemia associated with type 2 diabetes mellitus (HCC)   Permanent atrial fibrillation   ICD (implantable cardioverter-defibrillator), biventricular, in situ   H/O intracranial hemorrhage   Acute  on chronic respiratory failure with hypoxia (HCC)   Type 2 diabetes mellitus (HCC)   Chronic combined systolic and diastolic CHF (congestive heart failure) (HCC)   Depression, recurrent ([place])   1  Fall at  home/??  Syncope/orthostasis Patient reported to admitting physician that he had a syncopal episode at home from standing position after walking a short distance however denies any significant injury.  Patient did complain of lightheadedness and dizziness prior to the fall and patient noted to have increasing falls recently.  Patient however denied syncopal episode to me as well as to the cardiologist.  CT head done negative for acute intracranial hemorrhage.  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  Acute/traumatic cervical spine pathology.  Fracture of left clavicular head, acute or possibly subacute new since prior CT of 11/07/2019.  Partially visualized moderate to large left pleural effusion or hemothorax.  Due to concern for possible syncopal episode in the setting of complicated cardiac history cardiology was consulted and assessed the patient.  It was felt patient's fall at home likely orthostatic versus vasovagal.  Orthostatic vital signs ordered but not done.  Patient noted on presentation to the ED to have soft blood pressures with systolics in the 88C to 16S.  Patient diuretics held on admission as well as Toprol-XL.  Patient denies any dizzy spells.  Blood pressure improved.  Patient on diuretics and low dose Coreg started per cardiology.  Monitor closely.  Appreciate cardiology input and recommendations.  PT/OT.  2.  Acute on chronic respiratory failure with hypoxia likely secondary to acute on chronic combined systolic and diastolic CHF/end-stage CHF/status post AICD/bilateral pleural effusions/pulmonary hypertension/chronic COPD Patient noted to be hypoxic at home of 74 to 80% on 5 L O2 on EMS arrival.  At time of admission oxygen sats had improved on 5 L O2.  Patient noted with end-stage CHF and severe COPD/emphysema with CT findings of pulmonary edema bilateral effusions.  Due to borderline blood pressure concern for orthostatic etiology for  falls.  Diuretics held on admission as well as beta-blocker.  Patient seen in consultation by cardiology.  Patient noted to be volume overloaded on examination.  Patient started on Lasix 80 mg IV every 12 hours per cardiology recommendations.  Urine output of 3.5 L over the past 24 hours.  Patient noted not to be able to tolerate ACE inhibitors/ARB in the past.  Beta-blocker on hold.  Patient started on low-dose Coreg by cardiology today.  Patient noted not to be a candidate for advanced therapies.  Palliative care has also been consulted for goals of care.  Patient likely will need SNF with palliative care following versus transitioning to hospice.  Cardiology following and appreciate input and recommendations.  3.  Permanent A. fib status post AV nodal ablation Per cardiology questionable syncope likely not secondary to an arrhythmia.  Patient status post left atrial appendage clipping.  Heart rate currently rate controlled.  Beta-blocker on hold due to concern for orthostasis.  Patient not a anticoagulation candidate due to prior history of intracerebral hemorrhage.  Aspirin 81 mg daily.  Appreciate cardiology input and recommendations.  4.  Severe COPD with chronic respiratory failure Continue Brovana nebs, Incruse Ellipta.  Albuterol nebs as needed.  Continue O2.  Patient noted to be on 5 L nasal cannula prior to admission.  5.  Diabetes mellitus type 2 Hemoglobin A1c 7.4 on 11/14/2019.  CBG of 147 this morning.  Continue to hold oral hypoglycemic agents.  Sliding scale  insulin.    6.  Hyperlipidemia Continue statin.  7.  Hypertension Due to concerns that falls likely due to orthostasis Toprol-XL has been held.  Patient on diuretics.  Patient started on Coreg 3.125 mg twice daily per cardiology.  Follow.  8.  History of stage I non-small cell lung cancer left Status post wedge resection.  Outpatient follow-up.  9.  Masslike appearance of the cecum Per CT imaging.  Admitting physician  discussed with patient.  May consider further nonemergent evaluation with colonoscopy however given patient's severe cardiac and pulmonary chronic medical problems with recent involvement of palliative care likely not to be beneficial at this point in time.  Palliative care has been consulted.   DVT prophylaxis: SCDs Code Status: DNR Family Communication: Updated patient.  No family at bedside. Disposition:   Status is: Inpatient    Dispo: The patient is from: Home              Anticipated d/c is to: SNF with palliative care following.              Anticipated d/c date is: In about 2 to 3 days.              Patient currently on IV diuretics, presenting with fall felt likely secondary to orthostasis, likely needing placement as patient with multiple falls at home and wife unable to take care of patient.  Patient likely needs palliative care following.        Consultants:   Cardiology: Dr. Audie Box 12/07/2019  Palliative care  Procedures:  CT head CT C-spine 12/06/2019  CT angiogram chest and abdomen 12/06/2019  Plain films of the L-spine 12/06/2019  Plain films of the T-spine 12/06/2019  Antimicrobials:   None   Subjective: Patient laying in bed.  Denies any further dizziness or lightheadedness.  States improvement with shortness of breath.  Complaining of some right-sided chest pain.  No further syncopal episodes.    Objective: Vitals:   12/08/19 0545 12/08/19 0729 12/08/19 0747 12/08/19 0803  BP: 101/62 105/63    Pulse: 73 74 77   Resp: 15 18    Temp: 98.7 F (37.1 C) 97.9 F (36.6 C)    TempSrc: Oral Oral    SpO2: 91% 96%  95%  Weight: 69.7 kg     Height:        Intake/Output Summary (Last 24 hours) at 12/08/2019 1028 Last data filed at 12/08/2019 0901 Gross per 24 hour  Intake 1525 ml  Output 2880 ml  Net -1355 ml   Filed Weights   12/07/19 0351 12/08/19 0545  Weight: 70.6 kg 69.7 kg    Examination:  General exam: Chronically  ill-appearing. Respiratory system: Decreased coarse breath sounds at the bases.  No wheezing.  Speaking in full sentences.  Normal respiratory effort.  Cardiovascular system: Regular rate rhythm S1 & S2 heard, positive JVD 1+ bilateral lower extremity edema.  Right-sided chest wall tender to palpation. Gastrointestinal system: Abdomen is soft, nontender, nondistended, positive bowel sounds.  No rebound.  No guarding. Central nervous system: Alert and oriented. No focal neurological deficits. Extremities: Symmetric 5 x 5 power. Skin: No rashes, lesions or ulcers Psychiatry: Judgement and insight appear normal. Mood & affect appropriate.     Data Reviewed: I have personally reviewed following labs and imaging studies  CBC: Recent Labs  Lab 12/06/19 1824 12/07/19 0029 12/08/19 0415  WBC 6.8 6.2 7.2  NEUTROABS  --   --  5.6  HGB 10.2* 10.3* 10.2*  HCT 36.6* 37.5* 35.6*  MCV 85.3 84.7 83.6  PLT 295 305 235    Basic Metabolic Panel: Recent Labs  Lab 12/06/19 1824 12/07/19 0029 12/08/19 0415  NA 142 140 139  K 4.9 3.7 3.5  CL 102 99 99  CO2 29 30 31   GLUCOSE 120* 173* 151*  BUN 22 20 17   CREATININE 1.18 1.10 1.02  CALCIUM 8.6* 8.5* 8.3*  MG 1.9  --  1.8    GFR: Estimated Creatinine Clearance: 64.5 mL/min (by C-G formula based on SCr of 1.02 mg/dL).  Liver Function Tests: No results for input(s): AST, ALT, ALKPHOS, BILITOT, PROT, ALBUMIN in the last 168 hours.  CBG: Recent Labs  Lab 12/07/19 0743 12/07/19 1153 12/07/19 1639 12/08/19 0727  GLUCAP 120* 163* 164* 147*     Recent Results (from the past 240 hour(s))  SARS Coronavirus 2 by RT PCR (hospital order, performed in [facility] hospital lab) Nasopharyngeal Nasopharyngeal Swab     Status: None   Collection Time: 12/06/19  6:24 PM   Specimen: Nasopharyngeal Swab  Result Value Ref Range Status   SARS Coronavirus 2 NEGATIVE NEGATIVE Final    Comment: (NOTE) SARS-CoV-2 target nucleic acids are NOT  DETECTED. The SARS-CoV-2 RNA is generally detectable in upper and lower respiratory specimens during the acute phase of infection. The lowest concentration of SARS-CoV-2 viral copies this assay can detect is 250 copies / mL. A negative result does not preclude SARS-CoV-2 infection and should not be used as the sole basis for treatment or other patient management decisions.  A negative result may occur with improper specimen collection / handling, submission of specimen other than nasopharyngeal swab, presence of viral mutation(s) within the areas targeted by this assay, and inadequate number of viral copies (<250 copies / mL). A negative result must be combined with clinical observations, patient history, and epidemiological information. Fact Sheet for Patients:   StrictlyIdeas.no Fact Sheet for Healthcare Providers: BankingDealers.co.za This test is not yet approved or cleared  by the [place] FDA and has been authorized for detection and/or diagnosis of SARS-CoV-2 by FDA under an Emergency Use Authorization (EUA).  This EUA will remain in effect (meaning this test can be used) for the duration of the COVID-19 declaration under Section 564(b)(1) of the Act, 21 U.S.C. section 360bbb-3(b)(1), unless the authorization is terminated or revoked sooner. Performed at [facility], [place] [address], [place], [place] [address]          Radiology Studies: DG Chest 2 View  Result Date: 12/06/2019 CLINICAL DATA:  Chest pain and shortness of breath. Previous myocardial infarct congestive heart failure. EXAM: CHEST - 2 VIEW COMPARISON:  11/17/2019 FINDINGS: Moderate cardiac enlargement shows no significant change. Transvenous pacemaker remains in appropriate position. Pulmonary emphysema again noted. Increased atelectasis or consolidation is seen in the retrocardiac left lower lobe since prior study. Small bilateral pleural effusions show  no significant change. Diffuse interstitial infiltrates are also stable, consistent with diffuse interstitial edema. IMPRESSION: Increased left basilar atelectasis versus consolidation. Stable cardiomegaly, diffuse interstitial infiltrates, and small bilateral pleural effusions, consistent with congestive heart failure. Electronically Signed   By: Marlaine Hind M.D.   On: 12/06/2019 18:55   DG Thoracic Spine 2 View  Result Date: 12/06/2019 CLINICAL DATA:  Fall.  Thoracic back pain.  Initial encounter. EXAM: THORACIC SPINE 2 VIEWS COMPARISON:  None. FINDINGS: There is no evidence of thoracic spine fracture. Alignment is normal. No focal lytic or sclerotic bone lesions identified. Mild degenerative  disc disease seen in the mid and lower thoracic spine, and cervical spine. Generalized osteopenia also noted. IMPRESSION: No acute findings. Degenerative spondylosis, as described above.  Osteopenia. Electronically Signed   By: Marlaine Hind M.D.   On: 12/06/2019 19:40   DG Lumbar Spine Complete  Result Date: 12/06/2019 CLINICAL DATA:  74 year old male with fall and back pain. EXAM: LUMBAR SPINE - COMPLETE 4+ VIEW COMPARISON:  Lumbar spine radiograph dated 11/16/2019. FINDINGS: Five lumbar type vertebra. There is no acute fracture or subluxation of the lumbar spine. Multilevel mild chronic compression injury most prominent involving L1 and L4. Multilevel degenerative changes with bone spurring. Multilevel disc desiccation and vacuum phenomena. Grade 1 L3-L4 retrolisthesis and L4-L5 anterolisthesis. Atherosclerotic calcification of the abdominal aorta. IMPRESSION: No acute/traumatic lumbar spine pathology. Electronically Signed   By: Anner Crete M.D.   On: 12/06/2019 19:39   CT Head Wo Contrast  Result Date: 12/06/2019 CLINICAL DATA:  74 year old male with head trauma. EXAM: CT HEAD WITHOUT CONTRAST CT CERVICAL SPINE WITHOUT CONTRAST TECHNIQUE: Multidetector CT imaging of the head and cervical spine was  performed following the standard protocol without intravenous contrast. Multiplanar CT image reconstructions of the cervical spine were also generated. COMPARISON:  Head CT dated 09/24/2015. FINDINGS: CT HEAD FINDINGS Brain: Moderate age-related atrophy and chronic microvascular ischemic changes. 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 There is no acute intracranial hemorrhage. No mass effect midline shift. No extra-axial fluid collection. Vascular: No hyperdense vessel or unexpected calcification. Skull: Normal. Negative for fracture or focal lesion. Sinuses/Orbits: No acute finding. Other: None CT CERVICAL SPINE FINDINGS Alignment: No acute subluxation. Skull base and vertebrae: No acute fracture. Osteopenia. Soft tissues and spinal canal: No prevertebral fluid or swelling. No visible canal hematoma. Disc levels:  Multilevel degenerative changes. Upper chest: Emphysema. Partially visualized moderate to large left pleural effusion or hemothorax. Lobulated right pleural effusion or thickening. Increase in the size of the pleural effusions compared to the CT of 11/07/2019. Other: Fracture of the left clavicular head, acute or possibly subacute new since the prior CT of 11/07/2019. IMPRESSION: 1. No acute intracranial hemorrhage. 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 2.  acute/traumatic cervical spine pathology. 3. Fracture of the left clavicular head, acute or possibly subacute new since the prior CT of 11/07/2019. 4. Partially visualized moderate to large left pleural effusion or hemothorax. Increase in the size of the pleural effusions compared to the CT of 11/07/2019. Electronically Signed   By: Anner Crete M.D.   On: 12/06/2019 20:02   CT Cervical Spine Wo Contrast  Result Date: 12/06/2019 CLINICAL DATA:  74 year old male with head trauma. EXAM: CT HEAD WITHOUT CONTRAST CT CERVICAL SPINE WITHOUT CONTRAST TECHNIQUE: Multidetector CT imaging of the head and  cervical spine was performed following the standard protocol without intravenous contrast. Multiplanar CT image reconstructions of the cervical spine were also generated. COMPARISON:  Head CT dated 09/24/2015. FINDINGS: CT HEAD FINDINGS Brain: Moderate age-related atrophy and chronic microvascular ischemic changes. 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 There is no acute intracranial hemorrhage. No mass effect midline shift. No extra-axial fluid collection. Vascular: No hyperdense vessel or unexpected calcification. Skull: Normal. Negative for fracture or focal lesion. Sinuses/Orbits: No acute finding. Other: None CT CERVICAL SPINE FINDINGS Alignment: No acute subluxation. Skull base and vertebrae: No acute fracture. Osteopenia. Soft tissues and spinal canal: No prevertebral fluid or swelling. No visible canal hematoma. Disc levels:  Multilevel degenerative changes. Upper chest: Emphysema. Partially visualized moderate to large left pleural effusion  or hemothorax. Lobulated right pleural effusion or thickening. Increase in the size of the pleural effusions compared to the CT of 11/07/2019. Other: Fracture of the left clavicular head, acute or possibly subacute new since the prior CT of 11/07/2019. IMPRESSION: 1. No acute intracranial hemorrhage. 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 2.  acute/traumatic cervical spine pathology. 3. Fracture of the left clavicular head, acute or possibly subacute new since the prior CT of 11/07/2019. 4. Partially visualized moderate to large left pleural effusion or hemothorax. Increase in the size of the pleural effusions compared to the CT of 11/07/2019. Electronically Signed   By: Anner Crete M.D.   On: 12/06/2019 20:02   CT Angio Chest/Abd/Pel for Dissection W and/or Wo Contrast  Result Date: 12/06/2019 CLINICAL DATA:  Chest pain.  Concern for aortic dissection. EXAM: CT ANGIOGRAPHY CHEST, ABDOMEN AND PELVIS TECHNIQUE:  Non-contrast CT of the chest was initially obtained. Multidetector CT imaging through the chest, abdomen and pelvis was performed using the standard protocol during bolus administration of intravenous contrast. Multiplanar reconstructed images and MIPs were obtained and reviewed to evaluate the vascular anatomy. CONTRAST:  149mL OMNIPAQUE IOHEXOL 350 MG/ML SOLN COMPARISON:  11/07/2019 FINDINGS: CTA CHEST FINDINGS Cardiovascular: There is significant cardiac enlargement. The main pulmonary artery is dilated without evidence for an acute pulmonary embolism. There is no evidence for thoracic aortic dissection. Coronary artery calcifications are noted. The arch vessels are grossly patent. 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 This is essentially unchanged from prior study dated 11/07/2019. Mediastinum/Nodes: --there is mild mediastinal adenopathy. This is essentially stable from prior study. --No axillary lymphadenopathy. --No supraclavicular lymphadenopathy. --Normal thyroid gland. --The esophagus is unremarkable Lungs/Pleura: There are moderate severe emphysematous changes bilaterally. There is interlobular septal thickening. There are moderate-sized bilateral pleural effusions, left greater than right. The right-sided pleural effusion appears to be at least partially loculated. There is bronchial wall thickening and mild mucous plugging at the lung bases bilaterally. There are postsurgical changes in the left upper lobe likely related to prior wedge resection. Musculoskeletal: No chest wall abnormality. No acute or significant osseous findings. Review of the MIP images confirms the above findings. CTA ABDOMEN AND PELVIS FINDINGS VASCULAR Aorta: Normal caliber aorta without aneurysm, dissection, vasculitis or significant stenosis. Celiac: There is a mild loop shaped curvature of the celiac axis with associated narrowing. This can be seen in patients  with median arcuate ligament syndrome. SMA: There is variant hepatic arterial anatomy with a replaced common hepatic artery arising from the SMA. Renals: Multiple bilateral renal arteries are noted, all of which appear to be grossly patent. IMA: Patent without evidence of aneurysm, dissection, vasculitis or significant stenosis. Inflow: Patent without evidence of aneurysm, dissection, vasculitis or significant stenosis. Veins: No obvious venous abnormality within the limitations of this arterial phase study. Review of the MIP images confirms the above findings. NON-VASCULAR Hepatobiliary: The liver appears cirrhotic. Normal gallbladder.There is no biliary ductal dilation. Pancreas: Normal contours without ductal dilatation. No peripancreatic fluid collection. Spleen: Unremarkable. Adrenals/Urinary Tract: --Adrenal glands: Unremarkable. --Right kidney/ureter: No hydronephrosis or radiopaque kidney stones. --Left kidney/ureter: No hydronephrosis or radiopaque kidney stones. --Urinary bladder: The urinary bladder is significantly distended. Stomach/Bowel: --Stomach/Duodenum: No hiatal hernia or other gastric abnormality. Normal duodenal course and caliber. --Small bowel: Unremarkable. --Colon: There is an masslike appearance of the cecum with multiple small mildly enlarged regional lymph nodes. --Appendix: Normal. Lymphatic: --No retroperitoneal lymphadenopathy. --  No mesenteric lymphadenopathy. --No pelvic or inguinal lymphadenopathy. Reproductive: Prostate gland is enlarged. Other: There is a mild amount presacral free fluid. There is no free air. There is mild body wall edema. Musculoskeletal. Old compression fractures are noted throughout the lumbar spine with multilevel degenerative changes. These are essentially stable from prior study. Review of the MIP images confirms the above findings. IMPRESSION: 1. No evidence for an acute vascular abnormality. There is no evidence for a thoracic aortic dissection. No  evidence for an acute pulmonary embolism. 2. 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 3. Profound cardiomegaly with findings of volume overload including small bilateral pleural effusions and body wall edema. The right-sided pleural effusion appears to be at least partially loculated. 4. Findings are concerning for a cecal mass as detailed above. Follow-up with nonemergent colonoscopy is recommended. 5. Significantly distended urinary bladder. 6. Findings suspicious for underlying cirrhosis. 7. Relatively stable mediastinal and hilar adenopathy of unknown clinical significance. 8. Severe emphysematous changes with findings consistent with infectious or reactive bronchiolitis at the lung bases. Aortic Atherosclerosis (ICD10-I70.0) and Emphysema (ICD10-J43.9). Electronically Signed   By: Constance Holster M.D.   On: 12/06/2019 20:08        Scheduled Meds: . arformoterol  15 mcg Nebulization BID   Or  . umeclidinium bromide  1 puff Inhalation BID  . aspirin EC  81 mg Oral Daily  . atorvastatin  10 mg Oral Daily  . bethanechol  10 mg Oral TID  . carvedilol  3.125 mg Oral BID WC  . citalopram  10 mg Oral Daily  . furosemide  80 mg Intravenous BID  . ibuprofen  400 mg Oral Once  . insulin aspart  0-9 Units Subcutaneous TID WC  . pantoprazole  40 mg Oral Q0600  . potassium chloride  40 mEq Oral BID  . sodium chloride flush  3 mL Intravenous Q12H  . tamsulosin  0.4 mg Oral QPC supper   Continuous Infusions: . magnesium sulfate bolus IVPB 2 g (12/08/19 1003)     LOS: 2 days    Time spent: 40 minutes    Irine Seal, MD [facility]   To contact the attending provider between 7A-7P or the covering provider during after hours 7P-7A, please log into the web site [email or web address] and access using universal [place] password for that web site. If you do not have the password, please call the hospital operator.  12/08/2019, 10:28 AM

## 2019-12-09 LAB — BASIC METABOLIC PANEL
Anion gap: 10 (ref 5–15)
BUN: 20 mg/dL (ref 8–23)
CO2: 30 mmol/L (ref 22–32)
Calcium: 8.4 mg/dL — ABNORMAL LOW (ref 8.9–10.3)
Chloride: 97 mmol/L — ABNORMAL LOW (ref 98–111)
Creatinine, Ser: 1.01 mg/dL (ref 0.61–1.24)
GFR calc Af Amer: >60 mL/min (ref >60)
GFR calc non Af Amer: >60 mL/min (ref >60)
Glucose, Bld: 138 mg/dL — ABNORMAL HIGH (ref 70–99)
Potassium: 4.1 mmol/L (ref 3.5–5.1)
Sodium: 137 mmol/L (ref 135–145)

## 2019-12-09 LAB — GLUCOSE, CAPILLARY
Glucose-Capillary: 155 mg/dL — ABNORMAL HIGH (ref 70–99)
Glucose-Capillary: 144 mg/dL — ABNORMAL HIGH (ref 70–99)
Glucose-Capillary: 131 mg/dL — ABNORMAL HIGH (ref 70–99)
Glucose-Capillary: 206 mg/dL — ABNORMAL HIGH (ref 70–99)

## 2019-12-09 LAB — MAGNESIUM: Magnesium: 2.1 mg/dL (ref 1.7–2.4)

## 2019-12-09 MED ORDER — DIGOXIN 125 MCG PO TABS
0.1250 mg | ORAL_TABLET | Freq: Every day | ORAL | Status: DC
  Administered 2019-12-09 – 2019-12-12 (×4): 0.125 mg via ORAL
  Filled 2019-12-09 (×4): qty 1

## 2019-12-09 MED ORDER — UMECLIDINIUM BROMIDE 62.5 MCG/INH IN AEPB
1.0000 | INHALATION_SPRAY | Freq: Every day | RESPIRATORY_TRACT | Status: DC
  Administered 2019-12-10 – 2019-12-12 (×3): 1 via RESPIRATORY_TRACT

## 2019-12-09 MED ORDER — ARFORMOTEROL TARTRATE 15 MCG/2ML IN NEBU: 15.0000 ug | INHALATION_SOLUTION | Freq: Every day | RESPIRATORY_TRACT | Status: DC

## 2019-12-09 NOTE — TOC Initial Note (Signed)
Transition of Care [facility]) - Initial/Assessment Note    Patient Details  Name: [name] MRN: [id number] Date of Birth: October 07, 1945  Transition of Care [facility]) CM/SW Contact:    Trula Ore, [place] Phone Number: 12/09/2019, 2:12 PM  Clinical Narrative:                   CSW spoke with patient at bedside. Patient is agreeable to SNF placement. Patient gave CSW permission to fax out initial referral to [place] area. Patient gave CSW permission to discuss his care with his spouse [name].   Pending bed offers.PASSR is under review.  CSW will continue to follow.  Expected Discharge Plan: Skilled Nursing Facility Barriers to Discharge: Continued Medical Work up   Patient Goals and CMS Choice Patient states their goals for this hospitalization and ongoing recovery are:: SNF CMS Medicare.gov Compare Post Acute Care list provided to:: Patient Choice offered to / list presented to : Patient  Expected Discharge Plan and Services Expected Discharge Plan: [place]       Living arrangements for the past 2 months: Single Family Home                                      Prior Living Arrangements/Services Living arrangements for the past 2 months: Single Family Home Lives with:: Self, Spouse Patient language and need for interpreter reviewed:: Yes Do you feel safe going back to the place where you live?: No   SNF  Need for Family Participation in Patient Care: Yes (Comment) Care giver support system in place?: Yes (comment)   Criminal Activity/Legal Involvement Pertinent to Current Situation/Hospitalization: No - Comment as needed  Activities of Daily Living Home Assistive Devices/Equipment: Eyeglasses, CBG Meter ADL Screening (condition at time of admission) Patient's cognitive ability adequate to safely complete daily activities?: Yes Is the patient deaf or have difficulty hearing?: Yes Does the patient have difficulty seeing, even when wearing  glasses/contacts?: No Does the patient have difficulty concentrating, remembering, or making decisions?: No Patient able to express need for assistance with ADLs?: Yes Does the patient have difficulty dressing or bathing?: No Independently performs ADLs?: Yes (appropriate for developmental age) Does the patient have difficulty walking or climbing stairs?: Yes Weakness of Legs: Both Weakness of Arms/Hands: None  Permission Sought/Granted Permission sought to share information with : Case Manager, Family Supports, Chartered certified accountant granted to share information with : Yes, Verbal Permission Granted  Share Information with NAME: [name]  Permission granted to share info w AGENCY: SNF  Permission granted to share info w Relationship: Spouse  Permission granted to share info w Contact Information: [name] [phone]  Emotional Assessment Appearance:: Appears stated age Attitude/Demeanor/Rapport: Gracious Affect (typically observed): Calm Orientation: : Oriented to Self, Oriented to Place, Oriented to  Time, Oriented to Situation Alcohol / Substance Use: Not Applicable Psych Involvement: No (comment)  Admission diagnosis:  Near syncope [R55] Acute on chronic respiratory failure with hypoxia (HCC) [J96.21] Chest pain, unspecified type [R07.9] Patient Active Problem List   Diagnosis Date Noted  . Pressure injury of skin 12/08/2019  . Depression, recurrent ([place])   . Acute respiratory failure with hypoxia ([place])   . Goals of care, counseling/discussion   . Palliative care by specialist   . Acute exacerbation of CHF (congestive heart failure) ([place]) 11/13/2019  . Elevated troponin   . Chronic atrial fibrillation ([place])   .  Weakness generalized   . DNR (do not resuscitate)   . Acute on chronic combined systolic (congestive) and diastolic (congestive) heart failure ([place]) 04/27/2019  . COPD with acute exacerbation ([place]) 08/01/2018  . Pulmonary edema 07/30/2018  . Health  care maintenance 05/09/2018  . Gastritis and gastroduodenitis   . Diverticulum of duodenum   . Aortic root dilatation ([place])   . 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 Atherosclerosis of aorta ([place]) 08/11/2016  . Orthostatic hypotension 09/26/2015  . Near syncope 09/24/2015  . Syncope 09/24/2015  . Hepatic cirrhosis ([place]) 09/25/2014  . History of colonic polyps 09/25/2014  . Serum total bilirubin elevated 06/30/2014  . Follow-up examination, following unspecified surgery 01/27/2014  . Lung cancer ([place]) 11/29/2013  . Prosthetic valve dysfunction   . Chronic combined systolic and diastolic CHF (congestive heart failure) ([place])   . Type 2 diabetes mellitus ([place]) 11/04/2013  . Myocardial infarction ([place]) 11/01/2013  . Cardiomyopathy, nonischemic ([place]) 11/01/2013  . Acute on chronic respiratory failure with hypoxia ([place]) 08/15/2013  . H/O intracranial hemorrhage 08/13/2013  . H/O endocarditis 08/13/2013  . H/O: CVA (cerebrovascular accident) 08/13/2013  . Community acquired pneumonia 08/13/2013  . Chronic respiratory failure assoc with chf/ PAH 08/12/2013  . H/O atrioventricular nodal ablation   . S/P MVR (mitral valve replacement)   . Hypertension associated with diabetes ([place])   . Hyperlipidemia associated with type 2 diabetes mellitus ([place])   . Permanent atrial fibrillation   . Ejection fraction < 50%   . ICD (implantable cardioverter-defibrillator), biventricular, in situ   . Renal artery stenosis ([place])   . Pulmonary hypertension ([place])   . Primary cancer of left upper lobe of lung ([place]) 04/20/2009  . COLONIC POLYPS, ADENOMATOUS 03/22/2007  . COPD GOLD II 01/11/2007  . GERD 01/11/2007   PCP:  [name], NP Pharmacy:   [id number] - , [place] [address] Phone: [phone] Fax: [phone]  [facility] [place] [place] - [address] [place] [address] Phone: [phone] Fax: [phone]     Social Determinants of Health (SDOH) Interventions    Readmission Risk Interventions Readmission Risk Prevention Plan 11/25/2019 11/19/2019  Transportation Screening Complete Complete  PCP or Specialist Appt within 3-5 Days Complete Complete  HRI or [place] Complete Complete  Social Work Consult for [place] Planning/Counseling Complete Complete  Palliative Care Screening Complete Complete  Medication Review Press photographer) Complete Complete  Some recent data might be hidden

## 2019-12-09 NOTE — Social Work (Signed)
  RE:  [name]      Date of Birth: [birth year]:    Date: 12/09/2019       To Whom It May Concern:  Please be advised that the above-named patient will require a short-term nursing home stay - anticipated 30 days or less for rehabilitation and strengthening.  The plan is for return home.                 MD signature                Date

## 2019-12-09 NOTE — Progress Notes (Signed)
Daily Progress Note   Patient Name: [name]       Date: 12/09/2019 DOB: October 30, 1945  Age: 74 y.o. MRN#: [id number] Attending Physician: Eugenie Filler, MD Primary Care Physician: [name], NP Admit Date: 12/06/2019  Reason for Consultation/Follow-up: Establishing goals of care  Subjective: Patient awake, alert, oriented. Sitting on side of bed eating lunch. Dyspnea at rest. Denies pain or discomfort. On 6L [place]. Denies questions and requests I call and give his wife an update. PMT contact information left at bedside.   GOC:  Spoke with wife, [name] via telephone to discuss diagnoses, interventions, plan of care. Discussed disease trajectory of his condition and expectations moving forward. Discussed plan for SNF rehab to regain strength but also fear that his functional status is steadily declining due to progression of his heart failure. Discussed cardiology and attending recommendations for palliative involvement with terminal condition.   [name] is interested in outpatient palliative services but only if there is no financial burden with this because they do not have additional funds to cover frequent co-pays with palliative services. Reassured [name] that I would reach out to outpatient [facility] liaison to document this and do what they can to provide palliative resources once he is discharged. [name] is very concerned about this and does not want outpatient palliative if this will cause financial burden.   Provided education on difference between outpatient palliative services and hospice services including hospice philosophy.   Confirmed that my colleague completed MOST form and durable DNR with [facility] over the weekend. [name] confirms his wishes for DNR/DNI and that he would not wish  for heroic measures as he nears EOL. Discussed ongoing medical management, medical optimization, and plan for SNF rehab when optimized. Reassured [name] that SW will contact her with rehab bed offers.  Answered questions and concerns. PMT contact information given.   Length of Stay: 3  Current Medications: Scheduled Meds:  . acetaminophen  500 mg Oral TID  . arformoterol  15 mcg Nebulization BID   Or  . umeclidinium bromide  1 puff Inhalation BID  . aspirin EC  81 mg Oral Daily  . atorvastatin  10 mg Oral Daily  . bethanechol  10 mg Oral TID  . citalopram  10 mg Oral Daily  . digoxin  0.125 mg Oral Daily  .  furosemide  80 mg Intravenous BID  . insulin aspart  0-9 Units Subcutaneous TID WC  . pantoprazole  40 mg Oral Q0600  . sodium chloride flush  3 mL Intravenous Q12H  . tamsulosin  0.4 mg Oral QPC supper    Continuous Infusions:   PRN Meds: acetaminophen **OR** acetaminophen, albuterol, ondansetron **OR** ondansetron (ZOFRAN) IV  Physical Exam Vitals and nursing note reviewed.  Constitutional:      General: He is awake.     Appearance: He is ill-appearing.  HENT:     Head: Normocephalic and atraumatic.  Cardiovascular:     Rate and Rhythm: Rhythm irregularly irregular.  Pulmonary:     Comments: Dyspnea at rest. 6L [place] Abdominal:     Tenderness: There is no abdominal tenderness.  Skin:    General: Skin is warm and dry.  Neurological:     Mental Status: He is alert and oriented to person, place, and time.            Vital Signs: BP (!) 96/57 (BP Location: Left Arm)   Pulse 72   Temp (!) 97.5 F (36.4 C) (Axillary)   Resp 16   Ht 6\' 1"  (1.854 m)   Wt 69.8 kg   SpO2 94%   BMI 20.30 kg/m  SpO2: SpO2: 94 % O2 Device: O2 Device: Nasal Cannula O2 Flow Rate: O2 Flow Rate (L/min): 6 L/min  Intake/output summary:   Intake/Output Summary (Last 24 hours) at 12/09/2019 1215 Last data filed at 12/09/2019 0800 Gross per 24 hour  Intake 966 ml  Output 550 ml  Net 416  ml   LBM: Last BM Date: 12/08/19 Baseline Weight: Weight: 70.6 kg Most recent weight: Weight: 69.8 kg       Palliative Assessment/Data: PPS 40%      Patient Active Problem List   Diagnosis Date Noted  . Pressure injury of skin 12/08/2019  . Depression, recurrent ([place])   . Acute respiratory failure with hypoxia ([place])   . Goals of care, counseling/discussion   . Palliative care by specialist   . Acute exacerbation of CHF (congestive heart failure) ([place]) 11/13/2019  . Elevated troponin   . Chronic atrial fibrillation ([place])   . Weakness generalized   . DNR (do not resuscitate)   . Acute on chronic combined systolic (congestive) and diastolic (congestive) heart failure ([place]) 04/27/2019  . COPD with acute exacerbation ([place]) 08/01/2018  . Pulmonary edema 07/30/2018  . Health care maintenance 05/09/2018  . Gastritis and gastroduodenitis   . Diverticulum of duodenum   . Aortic root dilatation ([place])   . 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 Atherosclerosis of aorta ([place]) 08/11/2016  . Orthostatic hypotension 09/26/2015  . Near syncope 09/24/2015  . Syncope 09/24/2015  . Hepatic cirrhosis ([place]) 09/25/2014  . History of colonic polyps 09/25/2014  . Serum total bilirubin elevated 06/30/2014  . Follow-up examination, following unspecified surgery 01/27/2014  . Lung cancer ([place]) 11/29/2013  . Prosthetic valve dysfunction   . Chronic combined systolic and diastolic CHF (congestive heart failure) ([place])   . Type 2 diabetes mellitus ([place]) 11/04/2013  . Myocardial infarction ([place]) 11/01/2013  . Cardiomyopathy, nonischemic ([place]) 11/01/2013  . Acute on chronic respiratory failure with hypoxia ([place]) 08/15/2013  . H/O intracranial hemorrhage 08/13/2013  . H/O endocarditis 08/13/2013  . H/O: CVA (cerebrovascular accident) 08/13/2013  . Community acquired pneumonia 08/13/2013  . Chronic respiratory failure assoc with chf/ PAH 08/12/2013  . H/O atrioventricular nodal ablation   . S/P  MVR (  mitral valve replacement)   . Hypertension associated with diabetes ([place])   . Hyperlipidemia associated with type 2 diabetes mellitus ([place])   . Permanent atrial fibrillation   . Ejection fraction < 50%   . ICD (implantable cardioverter-defibrillator), biventricular, in situ   . Renal artery stenosis ([place])   . Pulmonary hypertension ([place])   . Primary cancer of left upper lobe of lung ([place]) 04/20/2009  . COLONIC POLYPS, ADENOMATOUS 03/22/2007  . COPD GOLD II 01/11/2007  . GERD 01/11/2007    Palliative Care Assessment & Plan   Patient Profile: Per intake H&P --> Joevanni A Cookis a 74 y.o.malewith medical history significant forend-stage chronic combined systolic and diastolic CHF (EF 23-55%) s/pBiVICD, chronic severe COPD/emphysema with chronic respiratory failure on 5 L of home O2 via [place], permanent A. fib on aspirin alone (Hxof ICH while on Coumadin) s/p AV nodal ablation, severe mitral regurgitation s/p bioprosthetic mitral valve replacement, pulmonary hypertension, non-small cell left lung cancer s/p wedge resection, type 2 diabetes, hypertension, and hyperlipidemia who presents to the ED for evaluation of chest pain.  Palliative care was asked to get involved to help address goals of care. Per chart review patient had recently been seen by OP Palliative Care  Assessment: Fall Syncope/orthostasis Acute on chronic combined CHF/end-stage CHF S/p AICD Acute on chronic respiratory failure with hypoxia Bilateral pleural effusions Pulmonary hypertension Chronic COPD Permanent afib Mass-like appearance of the cecum DM type 2 Hx of stage I non-small cell lung cancer left  Recommendations/Plan:   Initial PMT consult 12/07/19. MOST form completed. Patient's wishes include DNR/DNI, re-hospitalization if indicated to treat the treatable, determine use or limitation of ABX, IVF for trial period, NO feeding tube.  Continue current plan of care and medical management.   Medical  optimization inpatient with discharge plan SNF rehab.    Outpatient palliative referral. [facility] liaison following.   Code Status: DNR/DNI   Code Status Orders  (From admission, onward)         Start     Ordered   12/06/19 2333  Do not attempt resuscitation (DNR)  Continuous    Question Answer Comment  In the event of cardiac or respiratory ARREST Do not call a "code blue"   In the event of cardiac or respiratory ARREST Do not perform Intubation, CPR, defibrillation or ACLS   In the event of cardiac or respiratory ARREST Use medication by any route, position, wound care, and other measures to relive pain and suffering. May use oxygen, suction and manual treatment of airway obstruction as needed for comfort.      12/06/19 2335        Code Status History    Date Active Date Inactive Code Status Order ID Comments User Context   11/13/2019 2118 11/25/2019 1857 DNR [id number]  Truddie Hidden, MD ED   11/13/2019 2114 11/13/2019 2118 Full Code [id number]  Truddie Hidden, MD ED   06/07/2019 1321 06/10/2019 2108 DNR [id number]  Karmen Bongo, MD ED   04/27/2019 1937 05/02/2019 1915 DNR [id number]  Lenore Cordia, MD ED   07/30/2018 1722 08/02/2018 1814 DNR [id number]  Cristal Deer, MD Inpatient   09/24/2015 1730 09/27/2015 1751 Full Code [id number]  Kelvin Cellar, MD Inpatient   11/29/2013 1523 12/03/2013 1401 Full Code [id number]  [name], MD Inpatient   08/12/2013 1804 08/18/2013 1820 Full Code [id number]  Barrett, Evelene Croon, PA-C Inpatient   Advance Care Planning Activity       Prognosis:  Poor long-term prognosis  Discharge  Planning:  [place] for rehab with Palliative care service follow-up  Care plan was discussed with patient, wife [name]), [place] outpatient liaison Delsa Sale Love)  Thank you for allowing the Palliative Medicine Team to assist in the care of this patient.   Time In: 1200 Time Out: 1230 Total Time 30 Prolonged Time Billed no       Greater than 50%  of this time was spent counseling and coordinating care related to the above assessment and plan.  Ihor Dow, DNP, FNP-C Palliative Medicine Team  Phone: [phone] Fax: [phone]  Please contact Palliative Medicine Team phone at [phone] for questions and concerns.

## 2019-12-09 NOTE — Progress Notes (Signed)
PROGRESS NOTE    [name]  VQQ:[id number] DOB: 1945/07/17 DOA: 12/06/2019 PCP: [name], NP    Chief Complaint  Patient presents with  . Fall  . Shortness of Breath    Brief Narrative:  HPI per Dr. Garrel Ridgel is a 74 y.o. male with medical history significant for end-stage chronic combined systolic and diastolic CHF (EF 43-32%) s/p BiV ICD, chronic severe COPD/emphysema with chronic respiratory failure on 5 L of home O2 via [employer], permanent A. fib on aspirin alone (Hx of ICH while on Coumadin) s/p AV nodal ablation, severe mitral regurgitation s/p bioprosthetic mitral valve replacement, pulmonary hypertension, non-small cell left lung cancer s/p wedge resection, type 2 diabetes, hypertension, and hyperlipidemia who presents to the ED for evaluation of chest pain.  Patient recently hospitalized from 11/13/2019-11/25/2019 for acute on chronic CHF.  He was diuresed while in hospital.  Therapy evaluation recommended SNF however patient declined and he was discharged to home with home health and increase O2 requirement to 5 L via [place].  He was discharged on Lasix 60 mg twice daily.  Patient states around 78 AM earlier today (12/06/2019) he developed significant chest pain across his chest when he was walking down the hall.  He went into the kitchen and says he blacked out and fell to the ground.  He denied any significant injury.  He has been having continued chest pain since.  He came to the ED for further evaluation.  He reports continued swelling in both of his lower extremities.  He denies any significant change in his breathing.  He denies any cough.  He denies any subjective fevers, chills, diaphoresis, abdominal pain, or dysuria.  ED Course:  Initial vitals showed BP 105/61, pulse 70, RR 18, temp 97.2 Fahrenheit, SPO2 96% on 15 L NRB.  Patient was weaned down to 6 L O2 via [place].  Labs are notable for WBC 6.8, hemoglobin 10.2, platelets 295,000, sodium 142, potassium 4.9,  bicarb 29, BUN 22, creatinine 1.18, serum glucose 120, magnesium 1.9, high-sensitivity troponin I 19.  EKG shows V paced rhythm.  SARS-CoV-2 PCR is negative.  2 view chest x-ray shows stable cardiomegaly, diffuse interstitial infiltrates, small bilateral pleural effusions, and increased left basilar atelectasis versus consolidation.  Thoracic spine x-ray is negative for acute findings.  Lumbar spine x-ray is negative for acute/traumatic lumbar spine pathology.  CT head and cervical spine without contrast are negative for acute intracranial hemorrhage or acute/traumatic cervical spine pathology.  Acute or possibly subacute fracture of the left clavicular head is noted.  CT chest/abdomen/pelvis dissection study is negative for evidence of acute vascular abnormality, thoracic aortic dissection, or PE.  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  Small bilateral pleural effusions with partially loculated right-sided pleural effusion noted.  Masslike appearance of the cecum with multiple small mildly enlarged regional lymph nodes noted.  Distended urinary bladder and findings of suspected underlying cirrhosis are seen.  Severe emphysematous changes with infectious or reactive bronchiolitis at the lung bases also reported.  The hospitalist service was consulted to me for further evaluation and management.   Assessment & Plan:   Principal Problem:   Syncope Active Problems:   COPD GOLD II   Hypertension associated with diabetes ([facility])   Hyperlipidemia associated with type 2 diabetes mellitus (HCC)   Permanent atrial fibrillation   ICD (implantable cardioverter-defibrillator), biventricular, in situ   H/O intracranial hemorrhage   Acute on chronic respiratory failure with hypoxia (HCC)   Type 2 diabetes  mellitus (HCC)   Chronic combined systolic and diastolic CHF (congestive heart failure) (HCC)   Depression, recurrent (HCC)   Pressure injury of skin   1  Fall at home/  preSyncope/orthostasis Patient reported to admitting physician that he had a syncopal episode at home from standing position after walking a short distance however denies any significant injury.  Patient did complain of lightheadedness and dizziness prior to the fall and patient noted to have increasing falls recently.  Patient however denied syncopal episode to me as well as to the cardiologist.  CT head done negative for acute intracranial hemorrhage.  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  Acute/traumatic cervical spine pathology.  Fracture of left clavicular head, acute or possibly subacute new since prior CT of 11/07/2019.  Partially visualized moderate to large left pleural effusion or hemothorax.  Due to concern for possible syncopal episode in the setting of complicated cardiac history cardiology was consulted and assessed the patient.  It was felt patient's fall at home likely orthostatic versus vasovagal.  Orthostatic vital signs ordered but not done.  Patient noted on presentation to the ED to have soft blood pressures with systolics in the 00Q to 67Y.  Patient diuretics held on admission as well as Toprol-XL.  Patient denies any dizzy spells.  Blood pressure improved.  Patient on diuretics and was rechallenged with low-dose Coreg per cardiology however patient developed hypotension and low-dose low dose Coreg discontinued.  Digoxin started.  Monitor closely.  Per cardiology could likely transition to oral diuretics tomorrow.  Appreciate cardiology input and recommendations.  PT/OT.  2.  Acute on chronic respiratory failure with hypoxia likely secondary to acute on chronic combined systolic and diastolic CHF/end-stage CHF/status post AICD/bilateral pleural effusions/pulmonary hypertension/chronic COPD Patient noted to be hypoxic at home of 74 to 80% on 5 L O2 on EMS arrival.  At time of admission oxygen sats had improved on 5 L O2.  Patient noted  with end-stage CHF and severe COPD/emphysema with CT findings of pulmonary edema bilateral effusions.  Due to borderline blood pressure concern for orthostatic etiology for falls.  Diuretics held on admission as well as beta-blocker.  Patient seen in consultation by cardiology.  Patient noted to be volume overloaded on examination.  Patient started on Lasix 80 mg IV every 12 hours per cardiology recommendations.  Urine output of 0.875 L over the past 24 hours.  Patient noted not to be able to tolerate ACE inhibitors/ARB in the past.  Patient rechallenged with Coreg yesterday however patient developed hypotension and as such Coreg has been discontinued per cardiology.  Patient started on digoxin. Patient noted not to be a candidate for advanced therapies.  Palliative care has also been consulted for goals of care.  Patient likely will need SNF with palliative care following versus transitioning to hospice.  Cardiology following and appreciate input and recommendations.  3.  Permanent A. fib status post AV nodal ablation Per cardiology questionable syncope likely not secondary to an arrhythmia.  Patient status post left atrial appendage clipping.  Heart rate currently rate controlled.  Beta-blocker initially held due to signs of orthostasis.  Patient was challenged again with Coreg yesterday however developed hypotension and as such Coreg has been patient started on digoxin.  Patient started on digoxin per cardiology.  Patient not a anticoagulation candidate due to prior history of intracerebral hemorrhage.  Aspirin 81 mg daily.  Appreciate cardiology input and recommendations.  4.  Severe COPD with chronic respiratory  failure Continue Brovana nebs, Incruse Ellipta.  Albuterol nebs as needed.  Continue O2.  Patient noted to be on 5 L nasal cannula prior to admission.  5.  Diabetes mellitus type 2 Hemoglobin A1c 7.4 on 11/14/2019.  CBG of 155 this morning.  Continue to hold oral hypoglycemic agents.  Sliding  scale insulin.    6.  Hyperlipidemia Statin.  7.  Hypertension Due to concerns that falls likely due to orthostasis Toprol-XL has been held.  Patient on diuretics.  Patient started on Coreg 3.125 mg twice daily per cardiology on 12/08/2019 however patient noted to have hypotensive bouts and as such Coreg has been discontinued per cardiology..  Follow.  8.  History of stage I non-small cell lung cancer left Status post wedge resection.  Outpatient follow-up.  9.  Masslike appearance of the cecum Per CT imaging.  Admitting physician discussed with patient.  May consider further nonemergent evaluation with colonoscopy however given patient's severe cardiac and pulmonary chronic medical problems with recent involvement of palliative care likely not to be beneficial at this point in time.  Palliative care has been consulted.   DVT prophylaxis: SCDs Code Status: DNR Family Communication: Updated patient.  No family at bedside. Disposition:   Status is: Inpatient    Dispo: The patient is from: Home              Anticipated d/c is to: SNF with palliative care following.              Anticipated d/c date is: In about 2 to 3 days.              Patient currently on IV diuretics, presenting with fall felt likely secondary to orthostasis, likely needing placement as patient with multiple falls at home and wife unable to take care of patient.  Patient likely needs palliative care following.        Consultants:   Cardiology: Dr. Audie Box 12/07/2019  Palliative care  Procedures:  CT head CT C-spine 12/06/2019  CT angiogram chest and abdomen 12/06/2019  Plain films of the L-spine 12/06/2019  Plain films of the T-spine 12/06/2019  Antimicrobials:   None   Subjective: Patient laying in bed.  Shortness of breath improving.  Complaining of right-sided chest pain that he states slowly improving after being started on scheduled Tylenol.  Patient noted to be hypotensive with initiation of  beta-blocker yesterday which have subsequently been discontinued.  Denies any lightheadedness or dizziness.    Objective: Vitals:   12/09/19 0403 12/09/19 0806 12/09/19 1028 12/09/19 1100  BP: (!) 87/47 96/71  (!) 96/57  Pulse: 78 74 82 72  Resp: 18 16  16   Temp: 97.9 F (36.6 C) 97.6 F (36.4 C)  (!) 97.5 F (36.4 C)  TempSrc: Oral Oral  Axillary  SpO2: 92% (!) 88%  94%  Weight: 69.8 kg     Height:        Intake/Output Summary (Last 24 hours) at 12/09/2019 1123 Last data filed at 12/09/2019 0404 Gross per 24 hour  Intake 1010 ml  Output 750 ml  Net 260 ml   Filed Weights   12/07/19 0351 12/08/19 0545 12/09/19 0403  Weight: 70.6 kg 69.7 kg 69.8 kg    Examination:  General exam: Chronically ill-appearing. Respiratory system: Some decreased coarse breath sounds in the bases.  No wheezing.  Speaking in full sentences.  Normal respiratory effort.  Cardiovascular system: Regular rate rhythm no murmurs rubs or gallops.  Positive JVD.  1+  bilateral lower extremity edema.  Right chest wall tender to palpation.  Gastrointestinal system: Abdomen is nontender, nondistended, soft, positive bowel sounds.  No rebound.  No guarding.   Central nervous system: Alert and oriented. No focal neurological deficits. Extremities: Symmetric 5 x 5 power. Skin: No rashes, lesions or ulcers Psychiatry: Judgement and insight appear normal. Mood & affect appropriate.     Data Reviewed: I have personally reviewed following labs and imaging studies  CBC: Recent Labs  Lab 12/06/19 1824 12/07/19 0029 12/08/19 0415  WBC 6.8 6.2 7.2  NEUTROABS  --   --  5.6  HGB 10.2* 10.3* 10.2*  HCT 36.6* 37.5* 35.6*  MCV 85.3 84.7 83.6  PLT 295 305 767    Basic Metabolic Panel: Recent Labs  Lab 12/06/19 1824 12/07/19 0029 12/08/19 0415 12/09/19 0401  NA 142 140 139 137  K 4.9 3.7 3.5 4.1  CL 102 99 99 97*  CO2 29 30 31 30   GLUCOSE 120* 173* 151* 138*  BUN 22 20 17 20   CREATININE 1.18 1.10 1.02  1.01  CALCIUM 8.6* 8.5* 8.3* 8.4*  MG 1.9  --  1.8 2.1    GFR: Estimated Creatinine Clearance: 65.3 mL/min (by C-G formula based on SCr of 1.01 mg/dL).  Liver Function Tests: No results for input(s): AST, ALT, ALKPHOS, BILITOT, PROT, ALBUMIN in the last 168 hours.  CBG: Recent Labs  Lab 12/08/19 1145 12/08/19 1625 12/08/19 2119 12/09/19 0805 12/09/19 1100  GLUCAP 159* 141* 164* 155* 144*     Recent Results (from the past 240 hour(s))  SARS Coronavirus 2 by RT PCR (hospital order, performed in [facility] hospital lab) Nasopharyngeal Nasopharyngeal Swab     Status: None   Collection Time: 12/06/19  6:24 PM   Specimen: Nasopharyngeal Swab  Result Value Ref Range Status   SARS Coronavirus 2 NEGATIVE NEGATIVE Final    Comment: (NOTE) SARS-CoV-2 target nucleic acids are NOT DETECTED. The SARS-CoV-2 RNA is generally detectable in upper and lower respiratory specimens during the acute phase of infection. The lowest concentration of SARS-CoV-2 viral copies this assay can detect is 250 copies / mL. A negative result does not preclude SARS-CoV-2 infection and should not be used as the sole basis for treatment or other patient management decisions.  A negative result may occur with improper specimen collection / handling, submission of specimen other than nasopharyngeal swab, presence of viral mutation(s) within the areas targeted by this assay, and inadequate number of viral copies (<250 copies / mL). A negative result must be combined with clinical observations, patient history, and epidemiological information. Fact Sheet for Patients:   StrictlyIdeas.no Fact Sheet for Healthcare Providers: BankingDealers.co.za This test is not yet approved or cleared  by the [place] FDA and has been authorized for detection and/or diagnosis of SARS-CoV-2 by FDA under an Emergency Use Authorization (EUA).  This EUA will remain in effect  (meaning this test can be used) for the duration of the COVID-19 declaration under Section 564(b)(1) of the Act, 21 U.S.C. section 360bbb-3(b)(1), unless the authorization is terminated or revoked sooner. Performed at [facility], [place] [address] [place], [place] [address]          Radiology Studies: No results found.      Scheduled Meds: . acetaminophen  500 mg Oral TID  . arformoterol  15 mcg Nebulization BID   Or  . umeclidinium bromide  1 puff Inhalation BID  . aspirin EC  81 mg Oral Daily  .  atorvastatin  10 mg Oral Daily  . bethanechol  10 mg Oral TID  . citalopram  10 mg Oral Daily  . digoxin  0.125 mg Oral Daily  . furosemide  80 mg Intravenous BID  . insulin aspart  0-9 Units Subcutaneous TID WC  . pantoprazole  40 mg Oral Q0600  . sodium chloride flush  3 mL Intravenous Q12H  . tamsulosin  0.4 mg Oral QPC supper   Continuous Infusions:    LOS: 3 days    Time spent: 40 minutes    Irine Seal, MD [facility]   To contact the attending provider between 7A-7P or the covering provider during after hours 7P-7A, please log into the web site [email or web address] and access using universal [place] password for that web site. If you do not have the password, please call the hospital operator.  12/09/2019, 11:23 AM

## 2019-12-09 NOTE — Progress Notes (Signed)
Cardiology Progress Note  Patient ID: [name] MRN: [id number] DOB: 02/20/1946 Date of Encounter: 12/09/2019  Primary Cardiologist: Candee Furbish, MD  Subjective   Chief Complaint: SOB  HPI: Still SOB. Did not tolerate BB. Hypotensive. Digoxin added this AM.   ROS:  All other ROS reviewed and negative. Pertinent positives noted in the HPI.     Inpatient Medications  Scheduled Meds: . acetaminophen  500 mg Oral TID  . arformoterol  15 mcg Nebulization BID   Or  . umeclidinium bromide  1 puff Inhalation BID  . aspirin EC  81 mg Oral Daily  . atorvastatin  10 mg Oral Daily  . bethanechol  10 mg Oral TID  . citalopram  10 mg Oral Daily  . digoxin  0.125 mg Oral Daily  . furosemide  80 mg Intravenous BID  . insulin aspart  0-9 Units Subcutaneous TID WC  . pantoprazole  40 mg Oral Q0600  . sodium chloride flush  3 mL Intravenous Q12H  . tamsulosin  0.4 mg Oral QPC supper   Continuous Infusions:  PRN Meds: acetaminophen **OR** acetaminophen, albuterol, ondansetron **OR** ondansetron (ZOFRAN) IV   Vital Signs   Vitals:   12/08/19 2009 12/09/19 0031 12/09/19 0035 12/09/19 0403  BP:  (!) 80/51 90/69 (!) 87/47  Pulse:  73 74 78  Resp:  13 14 18   Temp:  97.6 F (36.4 C)  97.9 F (36.6 C)  TempSrc:  Axillary  Oral  SpO2: 95% 95% 94% 92%  Weight:    69.8 kg  Height:        Intake/Output Summary (Last 24 hours) at 12/09/2019 0759 Last data filed at 12/09/2019 0404 Gross per 24 hour  Intake 1010 ml  Output 875 ml  Net 135 ml   Last 3 Weights 12/09/2019 12/08/2019 12/07/2019  Weight (lbs) 153 lb 14.4 oz 153 lb 9.6 oz 155 lb 10.3 oz  Weight (kg) 69.809 kg 69.673 kg 70.6 kg      Telemetry  Overnight telemetry shows V paced rhythm, which I personally reviewed.   ECG  The most recent ECG shows BiV paced rhythm 73 bpm, which I personally reviewed.   Physical Exam   Vitals:   12/08/19 2009 12/09/19 0031 12/09/19 0035 12/09/19 0403  BP:  (!) 80/51 90/69 (!) 87/47    Pulse:  73 74 78  Resp:  13 14 18   Temp:  97.6 F (36.4 C)  97.9 F (36.6 C)  TempSrc:  Axillary  Oral  SpO2: 95% 95% 94% 92%  Weight:    69.8 kg  Height:         Intake/Output Summary (Last 24 hours) at 12/09/2019 0759 Last data filed at 12/09/2019 0404 Gross per 24 hour  Intake 1010 ml  Output 875 ml  Net 135 ml    Last 3 Weights 12/09/2019 12/08/2019 12/07/2019  Weight (lbs) 153 lb 14.4 oz 153 lb 9.6 oz 155 lb 10.3 oz  Weight (kg) 69.809 kg 69.673 kg 70.6 kg    Body mass index is 20.3 kg/m.   General: Well nourished, well developed, in no acute distress Head: Atraumatic, normal size  Eyes: PEERLA, EOMI  Neck: Supple, +JVD Endocrine: No thryomegaly Cardiac: Normal S1, S2; RRR; no murmurs, rubs, or gallops Lungs: +rales Abd: Soft, nontender, no hepatomegaly  Ext: 2+ pitting edema  Musculoskeletal: No deformities, BUE and BLE strength normal and equal Skin: Warm and dry, no rashes   Neuro: Alert and oriented to person, place, time, and situation,  CNII-XII grossly intact, no focal deficits  Psych: Normal mood and affect   Labs  High Sensitivity Troponin:   Recent Labs  Lab 11/15/19 1123 11/15/19 1238 11/15/19 1636 12/06/19 1824 12/06/19 2140  TROPONINIHS 19* 18* 21* 19* 19*     Cardiac EnzymesNo results for input(s): TROPONINI in the last 168 hours. No results for input(s): TROPIPOC in the last 168 hours.  Chemistry Recent Labs  Lab 12/07/19 0029 12/08/19 0415 12/09/19 0401  NA 140 139 137  K 3.7 3.5 4.1  CL 99 99 97*  CO2 30 31 30   GLUCOSE 173* 151* 138*  BUN 20 17 20   CREATININE 1.10 1.02 1.01  CALCIUM 8.5* 8.3* 8.4*  GFRNONAA >60 >60 >60  GFRAA >60 >60 >60  ANIONGAP 11 9 10     Hematology Recent Labs  Lab 12/06/19 1824 12/07/19 0029 12/08/19 0415  WBC 6.8 6.2 7.2  RBC 4.29 4.43 4.26  HGB 10.2* 10.3* 10.2*  HCT 36.6* 37.5* 35.6*  MCV 85.3 84.7 83.6  MCH 23.8* 23.3* 23.9*  MCHC 27.9* 27.5* 28.7*  RDW 27.4* 27.3* 26.6*  PLT 295 305 286    BNP Recent Labs  Lab 12/07/19 0029  BNP 1,365.1*    DDimer No results for input(s): DDIMER in the last 168 hours.   Radiology  No results found.  Cardiac Studies  TTE 11/14/2019 1. LVEF is severely depressed with diffuse hypokinesis, worse in the  septal and apical walls Compared to report from Nov 2020 echo, LVEF is  worse (apical views foreshortened in 2020 exam which makes assessment  difficult). Left ventricular ejection  fraction, by estimation, is 25 to 30%. The left ventricle has severely  decreased function. The left ventricle demonstrates global hypokinesis.  There is mild left ventricular hypertrophy. Left ventricular diastolic  parameters are indeterminate.  2. Right ventricular systolic function is moderately reduced. The right  ventricular size is severely enlarged. There is moderately elevated  pulmonary artery systolic pressure.  3. Left atrial size was severely dilated.  4. Right atrial size was severely dilated.  5. A 33 mm Medtronic Mosai bioprosthesisiis present Peak and mean  gradients through the vave are 15 and 6 mm Hg respectively, both lower  than previous echo report in 2020.Marland Kitchen The mitral valve has been  repaired/replaced. Trivial mitral valve regurgitation.  6. The aortic valve is abnormal. Aortic valve regurgitation is not  visualized. Mild to moderate aortic valve sclerosis/calcification is  present, without any evidence of aortic stenosis.  7. Aortic root is 51 mm Previous echo measured echo above this; current  exam did not image that high.. Aortic dilatation noted. Aneurysm of the  aortic root. There is moderate to severe dilatation of the aortic root  measuring 51 mm.  8. The inferior vena cava is dilated in size with <50% respiratory  variability, suggesting right atrial pressure of 15 mmHg.   Patient Profile  [name] is a 74 y.o. male with CHF (25% with end stage CM), Afib s/p AVN ablation with BiV ICD, severe MR s/p MVR, LAA  clipping, severe COPD, frailty admitted 12/06/2019 after a fall that was likely orthostatic per report.   Assessment & Plan   1. Fall -fell after leaving bathroom. Got dizzy/blurred vision but did not pass out. Troponin negative. EKG with BiV paced rhythm. CTA without dissection.  -suspect related to hypotension and end-stage CM; not tolerating GDMT.   2. Systolic HF, EF 51%, End-Stage CM, Severe MR s/p MVR/Hypotension -well documented to not tolerate GDMT  due to hypotension. Not candidate for advanced therapies due to COPD/frailty/cecal mass.  -did not tolerate coreg. Will stop. Will add digoxin 0.125 mcg daily. Digoxin level in AM.  -can try to add other GDMT if BP improves with digoxin.  -will continue with diuresis today. Hopefully will improve with digoxin.   3. Chest pain -non-cardiac. Troponin negative. No further cardiac work up.   4. Permanent Afib s/p AVN ablation with BiV PPM -no issues  -no AC due to prior cerebral hemorrhage. S/p LAA clipping.   For questions or updates, please contact [place] Please consult [email or web address] for contact info under   Time Spent with Patient: I have spent a total of 25 minutes with patient reviewing hospital notes, telemetry, EKGs, labs and examining the patient as well as establishing an assessment and plan that was discussed with the patient.  > 50% of time was spent in direct patient care.    Signed, Addison Naegeli. Audie Box, [place]  12/09/2019 7:59 AM

## 2019-12-09 NOTE — Progress Notes (Signed)
Occupational Therapy Evaluation Patient Details Name: [name] MRN: [id number] DOB: February 01, 1946 Today's Date: 12/09/2019    History of Present Illness [name] is a 74 y.o. male with medical history significant for end-stage chronic combined systolic and diastolic CHF (EF 82-99%) s/p BiV ICD, chronic severe COPD/emphysema with chronic respiratory failure on 5 L of home O2 via [place], permanent A. fib on aspirin alone (Hx of ICH while on Coumadin) s/p AV nodal ablation, severe mitral regurgitation s/p bioprosthetic mitral valve replacement, pulmonary hypertension, non-small cell left lung cancer s/p wedge resection, type 2 diabetes, hypertension presenting to the ED on 12/06/19 due to complaints of chest pain and syncope - admitted for further work-up.    Clinical Impression   Prior to hospitalization, pt was independent with BADLs, dependent on wife for IADLs (able to assist however if needed), and independent with functional mobility/transfers. Pt still drives. Pt admitted for above and limited by increased pain, decreased safety awareness/balance/activity tolerance/and strength. Today, pt agreeable to OT evaluation. Pt is HOH, repetition of questions required. Pt somewhat agitated and impulsive, standing up before therapist ready with lines and gait belt. Monitored SPO2 throughout, ranging from low 90s to high 70s with functional activity on 5-6L of O2. Instructed pt in deep breathing, allowing 2-3 minutes for recovery in semi-reclined position. Assessed balance, toileting, grooming, and safety awareness. Pt requires supervision-min guard for functional mobility and line management, supervision for grooming while standing at sink, and min guard for transferring on/off toilet using grab bar for support. Recommending SNF secondary to SPO2 limitations and decreased safety awareness. Will continue to follow acutely as able.     Follow Up Recommendations  SNF;Home health OT;Supervision/Assistance - 24  hour;Other (comment)(if pt declines SNF recommend HHOT for safety )    Equipment Recommendations       Recommendations for Other Services       Precautions / Restrictions Precautions Precautions: ICD/Pacemaker;Fall;Other (comment) Precaution Comments: watch SPO2 Restrictions Weight Bearing Restrictions: No      Mobility Bed Mobility Overal bed mobility: Modified Independent Bed Mobility: Supine to Sit;Sit to Supine     Supine to sit: Supervision;HOB elevated Sit to supine: Supervision;HOB elevated   General bed mobility comments: increased time and effort secondary to chest pain and line management  Transfers Overall transfer level: Needs assistance Equipment used: None Transfers: Sit to/from Stand Sit to Stand: Supervision         General transfer comment: pt impulsive and agitated; quickly standing up before therapist ready and situated with lines    Balance Overall balance assessment: Needs assistance Sitting-balance support: No upper extremity supported;Feet supported Sitting balance-Leahy Scale: Good     Standing balance support: No upper extremity supported;During functional activity Standing balance-Leahy Scale: Fair Standing balance comment: no dizziness; decreased safety awareness; impulsive                           ADL either performed or assessed with clinical judgement   ADL Overall ADL's : Needs assistance/impaired Eating/Feeding: Independent;Sitting   Grooming: Wash/dry hands;Supervision/safety;Standing   Upper Body Bathing: Set up;Sitting   Lower Body Bathing: Min guard;Cueing for safety;Sitting/lateral leans;Sit to/from stand   Upper Body Dressing : Set up;Sitting   Lower Body Dressing: Min guard;Sitting/lateral leans;Sit to/from stand   Toilet Transfer: Supervision/safety;Ambulation;Cueing for safety;Regular Toilet;Grab bars   Toileting- Clothing Manipulation and Hygiene: Min guard;Sit to/from stand;Sitting/lateral lean    Tub/ Shower Transfer: Min guard;Cueing for safety;Ambulation;Shower seat;Grab bars  Functional mobility during ADLs: Supervision/safety;Min guard General ADL Comments: evaluated functional transfers with supervision using no mobility device and BADLs including toileting and grooming standing at sink. Requires supervision-min guard for safety secondary to line management and impulsiveness.     Vision Baseline Vision/History: Wears glasses Wears Glasses: At all times Patient Visual Report: No change from baseline Vision Assessment?: No apparent visual deficits     Perception     Praxis      Pertinent Vitals/Pain Pain Assessment: 0-10 Pain Score: 7  Pain Location: R pec region Pain Descriptors / Indicators: Aching;Discomfort;Shooting Pain Intervention(s): Monitored during session;Repositioned     Hand Dominance Left   Extremity/Trunk Assessment Upper Extremity Assessment Upper Extremity Assessment: Overall WFL for tasks assessed LUE Deficits / Details: L shoulder ROM to 90*, limited due to pain- mostly pec muscles   Lower Extremity Assessment Lower Extremity Assessment: Defer to PT evaluation   Cervical / Trunk Assessment Cervical / Trunk Assessment: Kyphotic   Communication Communication Communication: HOH   Cognition Arousal/Alertness: Awake/alert Behavior During Therapy: WFL for tasks assessed/performed;Impulsive Overall Cognitive Status: Within Functional Limits for tasks assessed                                 General Comments: impulsive and slightly agitated; quickly stood up and ambulated throughout room before therapist ready    General Comments  monitored SPO2 throughout session; on 5L of O2; pt's O2 started in the low 90s and dropped to the high 70s after functional activity; educated pt on deep breathing; took about 3 minutes for O2 level to return to low 90s    Exercises     Shoulder Instructions      Home Living   Living  Arrangements: Spouse/significant other Available Help at Discharge: Family Type of Home: House Home Access: Stairs to enter Technical brewer of Steps: 3 Entrance Stairs-Rails: Can reach both Home Layout: One level     Bathroom Shower/Tub: Teacher, early years/pre: Standard Bathroom Accessibility: Yes   Home Equipment: Shower seat;Grab bars - tub/shower;Grab bars - toilet   Additional Comments: Wears 5L O2 at home      Prior Functioning/Environment Level of Independence: Independent        Comments: Independent, 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)        OT Problem List: Decreased strength;Decreased activity tolerance;Impaired balance (sitting and/or standing);Decreased safety awareness;Cardiopulmonary status limiting activity;Pain      OT Treatment/Interventions: Self-care/ADL training;Therapeutic exercise;Energy conservation;DME and/or AE instruction;Therapeutic activities;Balance training    OT Goals(Current goals can be found in the care plan section) Acute Rehab OT Goals Patient Stated Goal: to go home  OT Frequency: Min 2X/week   Barriers to D/C:            Co-evaluation              AM-PAC OT "6 Clicks" Daily Activity     Outcome Measure Help from another person eating meals?: None Help from another person taking care of personal grooming?: None Help from another person toileting, which includes using toliet, bedpan, or urinal?: A Little Help from another person bathing (including washing, rinsing, drying)?: A Little Help from another person to put on and taking off regular upper body clothing?: None Help from another person to put on and taking off regular lower body clothing?: A Little 6 Click Score: 21   End of Session Equipment Utilized During Treatment:  Gait belt;Oxygen Nurse Communication: Mobility status  Activity Tolerance: Patient tolerated treatment well;Treatment limited secondary to agitation Patient left: in bed;with  call bell/phone within reach  OT Visit Diagnosis: Unsteadiness on feet (R26.81);Other abnormalities of gait and mobility (R26.89);Muscle weakness (generalized) (M62.81)                Time: 0104-0459 OT Time Calculation (min): 25 min Charges:  OT General Charges $OT Visit: 1 Visit OT Evaluation $OT Eval Moderate Complexity: 1 Mod  Michel Bickers, OTR/L [facility] [phone]  Francesca Jewett 12/09/2019, 3:26 PM

## 2019-12-09 NOTE — NC FL2 (Signed)
[place] LEVEL OF CARE SCREENING TOOL     IDENTIFICATION  Patient Name: [name] Birthdate: 1946-02-25 Sex: male Admission Date (Current Location): 12/06/2019  [facility] and [place] Number:  Herbalist and Address:  The . [facility], [place] [address], [place], [place] [address]      Provider Number: [id number]  Attending Physician Name and Address:  Eugenie Filler, MD  Relative Name and Phone Number:       Current Level of Care: Hospital Recommended Level of Care: [place] Prior Approval Number:    Date Approved/Denied:   PASRR Number: pending(Simultaneous filing. User may not have seen previous data.)  Discharge Plan: SNF    Current Diagnoses: Patient Active Problem List   Diagnosis Date Noted  . Pressure injury of skin 12/08/2019  . Depression, recurrent ([place])   . Acute respiratory failure with hypoxia ([place])   . Goals of care, counseling/discussion   . Palliative care by specialist   . Acute exacerbation of CHF (congestive heart failure) ([place]) 11/13/2019  . Elevated troponin   . Chronic atrial fibrillation ([place])   . Weakness generalized   . DNR (do not resuscitate)   . Acute on chronic combined systolic (congestive) and diastolic (congestive) heart failure ([place]) 04/27/2019  . COPD with acute exacerbation ([place]) 08/01/2018  . Pulmonary edema 07/30/2018  . Health care maintenance 05/09/2018  . Gastritis and gastroduodenitis   . Diverticulum of duodenum   . Aortic root dilatation ([place])   . 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 Atherosclerosis of aorta ([place]) 08/11/2016  . Orthostatic hypotension 09/26/2015  . Near syncope 09/24/2015  . Syncope 09/24/2015  . Hepatic cirrhosis ([place]) 09/25/2014  . History of colonic polyps 09/25/2014  . Serum total bilirubin elevated 06/30/2014  . Follow-up examination, following unspecified surgery 01/27/2014  . Lung cancer ([place]) 11/29/2013  . Prosthetic valve  dysfunction   . Chronic combined systolic and diastolic CHF (congestive heart failure) ([place])   . Type 2 diabetes mellitus ([place]) 11/04/2013  . Myocardial infarction ([place]) 11/01/2013  . Cardiomyopathy, nonischemic ([place]) 11/01/2013  . Acute on chronic respiratory failure with hypoxia ([place]) 08/15/2013  . H/O intracranial hemorrhage 08/13/2013  . H/O endocarditis 08/13/2013  . H/O: CVA (cerebrovascular accident) 08/13/2013  . Community acquired pneumonia 08/13/2013  . Chronic respiratory failure assoc with chf/ PAH 08/12/2013  . H/O atrioventricular nodal ablation   . S/P MVR (mitral valve replacement)   . Hypertension associated with diabetes ([place])   . Hyperlipidemia associated with type 2 diabetes mellitus ([place])   . Permanent atrial fibrillation   . Ejection fraction < 50%   . ICD (implantable cardioverter-defibrillator), biventricular, in situ   . Renal artery stenosis ([place])   . Pulmonary hypertension ([place])   . Primary cancer of left upper lobe of lung ([place]) 04/20/2009  . COLONIC POLYPS, ADENOMATOUS 03/22/2007  . COPD GOLD II 01/11/2007  . GERD 01/11/2007    Orientation RESPIRATION BLADDER Height & Weight     Self, Time, Situation, Place  O2(6 nasal cannula) Continent Weight: 153 lb 14.4 oz (69.8 kg) Height:  6\' 1"  (185.4 cm)  BEHAVIORAL SYMPTOMS/MOOD NEUROLOGICAL BOWEL NUTRITION STATUS      Continent Diet(See Discharge Summary)  AMBULATORY STATUS COMMUNICATION OF NEEDS Skin   Limited Assist Verbally Skin abrasions, Other (Comment)(Approp for ethnicity,dry, abrasion eccymosis rash toe right ecchymosis location arm leg right left pressure injury coccyx mid right left stage 1)  Personal Care Assistance Level of Assistance  Bathing, Feeding, Dressing Bathing Assistance: Limited assistance Feeding assistance: Independent(able to feed self carb modified cardiac) Dressing Assistance: Limited assistance     Functional Limitations Info  Sight, Hearing,  Speech Sight Info: Impaired Hearing Info: Impaired Speech Info: Adequate    SPECIAL CARE FACTORS FREQUENCY  PT (By licensed PT), OT (By licensed OT)     PT Frequency: 5x min weekly OT Frequency: 5x min weekly            Contractures Contractures Info: Not present    Additional Factors Info  Allergies, Code Status Code Status Info: DNR Allergies Info: Warfarin Sodium,Anticoagulant Compound           Current Medications (12/09/2019):  This is the current hospital active medication list Current Facility-Administered Medications  Medication Dose Route Frequency Provider Last Rate Last Admin  . acetaminophen (TYLENOL) tablet 650 mg  650 mg Oral Q6H PRN Lenore Cordia, MD       Or  . acetaminophen (TYLENOL) suppository 650 mg  650 mg Rectal Q6H PRN Zada Finders R, MD      . acetaminophen (TYLENOL) tablet 500 mg  500 mg Oral TID Eugenie Filler, MD   500 mg at 12/09/19 1029  . albuterol (PROVENTIL) (2.5 MG/3ML) 0.083% nebulizer solution 2.5 mg  2.5 mg Nebulization Q2H PRN Zada Finders R, MD      . arformoterol (BROVANA) nebulizer solution 15 mcg  15 mcg Nebulization BID Zada Finders R, MD   15 mcg at 12/08/19 2008   Or  . umeclidinium bromide (INCRUSE ELLIPTA) 62.5 MCG/INH 1 puff  1 puff Inhalation BID Lenore Cordia, MD   1 puff at 12/09/19 [phone]  . aspirin EC tablet 81 mg  81 mg Oral Daily Lenore Cordia, MD   81 mg at 12/09/19 1028  . atorvastatin (LIPITOR) tablet 10 mg  10 mg Oral Daily Zada Finders R, MD   10 mg at 12/09/19 1029  . bethanechol (URECHOLINE) tablet 10 mg  10 mg Oral TID Lenore Cordia, MD   10 mg at 12/09/19 1029  . citalopram (CELEXA) tablet 10 mg  10 mg Oral Daily Rosezella Rumpf, NP   10 mg at 12/09/19 1028  . digoxin (LANOXIN) tablet 0.125 mg  0.125 mg Oral Daily O'Neal, Cassie Freer, MD   0.125 mg at 12/09/19 1028  . furosemide (LASIX) injection 80 mg  80 mg Intravenous BID Geralynn Rile, MD   80 mg at 12/09/19 9924  . insulin  aspart (novoLOG) injection 0-9 Units  0-9 Units Subcutaneous TID WC Eugenie Filler, MD   1 Units at 12/09/19 1208  . ondansetron (ZOFRAN) tablet 4 mg  4 mg Oral Q6H PRN Lenore Cordia, MD       Or  . ondansetron (ZOFRAN) injection 4 mg  4 mg Intravenous Q6H PRN Zada Finders R, MD      . pantoprazole (PROTONIX) EC tablet 40 mg  40 mg Oral Q0600 Eugenie Filler, MD   40 mg at 12/09/19 0645  . sodium chloride flush (NS) 0.9 % injection 3 mL  3 mL Intravenous Q12H Zada Finders R, MD   3 mL at 12/09/19 0800  . tamsulosin (FLOMAX) capsule 0.4 mg  0.4 mg Oral QPC supper Lenore Cordia, MD   0.4 mg at 12/08/19 1701     Discharge Medications: Please see discharge summary for a list of discharge medications.  Relevant Imaging Results:  Relevant  Lab Results:   Additional Information [id number]  Trula Ore, LCSWA

## 2019-12-09 NOTE — Progress Notes (Signed)
Pt O2 went down to 84-88% on O2 6L via [place] at rest.  Educated him to take a deep breath through his nose, and O2 sat went up to 99% on 6L.  Will continue to monitor.  Idolina Primer, RN

## 2019-12-10 DIAGNOSIS — I5043 Acute on chronic combined systolic (congestive) and diastolic (congestive) heart failure: Secondary | ICD-10-CM

## 2019-12-10 LAB — BASIC METABOLIC PANEL
Anion gap: 12 (ref 5–15)
BUN: 21 mg/dL (ref 8–23)
CO2: 30 mmol/L (ref 22–32)
Calcium: 8.3 mg/dL — ABNORMAL LOW (ref 8.9–10.3)
Chloride: 95 mmol/L — ABNORMAL LOW (ref 98–111)
Creatinine, Ser: 1.03 mg/dL (ref 0.61–1.24)
GFR calc Af Amer: >60 mL/min (ref >60)
GFR calc non Af Amer: >60 mL/min (ref >60)
Glucose, Bld: 137 mg/dL — ABNORMAL HIGH (ref 70–99)
Potassium: 3.9 mmol/L (ref 3.5–5.1)
Sodium: 137 mmol/L (ref 135–145)

## 2019-12-10 LAB — MAGNESIUM: Magnesium: 2.1 mg/dL (ref 1.7–2.4)

## 2019-12-10 LAB — GLUCOSE, CAPILLARY
Glucose-Capillary: 136 mg/dL — ABNORMAL HIGH (ref 70–99)
Glucose-Capillary: 163 mg/dL — ABNORMAL HIGH (ref 70–99)
Glucose-Capillary: 137 mg/dL — ABNORMAL HIGH (ref 70–99)
Glucose-Capillary: 150 mg/dL — ABNORMAL HIGH (ref 70–99)

## 2019-12-10 LAB — DIGOXIN LEVEL: Digoxin Level: <0.2 ng/mL — ABNORMAL LOW (ref 0.8–2.0)

## 2019-12-10 MED ORDER — LEVALBUTEROL HCL 0.63 MG/3ML IN NEBU: 0.6300 mg | INHALATION_SOLUTION | Freq: Three times a day (TID) | RESPIRATORY_TRACT | Status: DC

## 2019-12-10 MED ORDER — LEVALBUTEROL HCL 0.63 MG/3ML IN NEBU
0.6300 mg | INHALATION_SOLUTION | Freq: Four times a day (QID) | RESPIRATORY_TRACT | Status: DC
  Administered 2019-12-10: 0.63 mg via RESPIRATORY_TRACT
  Filled 2019-12-10: qty 3

## 2019-12-10 MED ORDER — MOMETASONE FURO-FORMOTEROL FUM 100-5 MCG/ACT IN AERO
2.0000 | INHALATION_SPRAY | Freq: Two times a day (BID) | RESPIRATORY_TRACT | Status: DC
  Administered 2019-12-10 – 2019-12-12 (×4): 2 via RESPIRATORY_TRACT
  Filled 2019-12-10: qty 8.8

## 2019-12-10 NOTE — Plan of Care (Signed)
  Problem: Pain Managment: Goal: General experience of comfort will improve Outcome: Progressing   Problem: Safety: Goal: Ability to remain free from injury will improve Outcome: Progressing   

## 2019-12-10 NOTE — TOC Progression Note (Addendum)
Transition of Care [facility]) - Progression Note    Patient Details  Name: TAUHEED MCFAYDEN MRN: [id number] Date of Birth: Sep 10, 1945  Transition of Care [facility]) CM/SW [place], [place] Phone Number: 12/10/2019, 3:58 PM  Clinical Narrative:     CSW spoke with patient at bedside who chose [facility] for SNF placement. CSW followed up with Rober Minion who agreed to accept patient for placement. CSW spoke with patients wife who is agreeable to SNF placement at [facility]. PASSR number is pending.  PASSR number is pending.Patient has bed at [facility] for SNF placement when medically ready for discharge.  CSW will continue to follow.  Expected Discharge Plan: [place] Barriers to Discharge: Continued Medical Work up  Expected Discharge Plan and Services Expected Discharge Plan: [place] arrangements for the past 2 months: Single Family Home                                       Social Determinants of Health (SDOH) Interventions    Readmission Risk Interventions Readmission Risk Prevention Plan 11/25/2019 11/19/2019  Transportation Screening Complete Complete  PCP or Specialist Appt within 3-5 Days Complete Complete  HRI or [place] Complete Complete  Social Work Consult for [place] Planning/Counseling Complete Complete  Palliative Care Screening Complete Complete  Medication Review Press photographer) Complete Complete  Some recent data might be hidden

## 2019-12-10 NOTE — Plan of Care (Signed)
  Problem: Education: Goal: Understanding of cardiac disease, CV risk reduction, and recovery process will improve Outcome: Progressing   Problem: Activity: Goal: Ability to tolerate increased activity will improve Outcome: Progressing   Problem: Cardiac: Goal: Ability to achieve and maintain adequate cardiovascular perfusion will improve Outcome: Progressing   Problem: Health Behavior/Discharge Planning: Goal: Ability to safely manage health-related needs after discharge will improve Outcome: Progressing   Problem: Safety: Goal: Ability to remain free from injury will improve Outcome: Adequate for Discharge

## 2019-12-10 NOTE — Progress Notes (Signed)
Physical Therapy Treatment Patient Details Name: [name] MRN: [id number] DOB: [birth year] Today's Date: 12/10/2019    History of Present Illness [name] is a 74 y.o. male with medical history significant for end-stage chronic combined systolic and diastolic CHF (EF 58-85%) s/p BiV ICD, chronic severe COPD/emphysema with chronic respiratory failure on 5 L of home O2 via [place], permanent A. fib on aspirin alone (Hx of ICH while on Coumadin) s/p AV nodal ablation, severe mitral regurgitation s/p bioprosthetic mitral valve replacement, pulmonary hypertension, non-small cell left lung cancer s/p wedge resection, type 2 diabetes, hypertension presenting to the ED on 12/06/19 due to complaints of chest pain and syncope - admitted for further work-up.     PT Comments    Pt admitted with above diagnosis. Pt was able to ambulate with RW with min guard assist and cues.  Pt desat to mid to low 70's and took 5 minutes to improve O2 sats to low 90's.  Pt fatigues quickly.  Needs rest breaks as well.   Pt currently with functional limitations due to endurance deficits. Pt will benefit from skilled PT to increase their independence and safety with mobility to allow discharge to the venue listed below.     Follow Up Recommendations  SNF;Supervision for mobility/OOB(if patient declines will need HHPT)     Equipment Recommendations  (defer)    Recommendations for Other Services       Precautions / Restrictions Precautions Precautions: ICD/Pacemaker;Fall;Other (comment) Precaution Comments: watch SPO2 Restrictions Weight Bearing Restrictions: No    Mobility  Bed Mobility Overal bed mobility: Modified Independent Bed Mobility: Supine to Sit;Sit to Supine     Supine to sit: Supervision;HOB elevated Sit to supine: Supervision;HOB elevated   General bed mobility comments: increased time and effort   Transfers Overall transfer level: Needs assistance Equipment used: None Transfers: Sit  to/from Stand Sit to Stand: Min guard Stand pivot transfers: Min guard       General transfer comment: pt impulsive and agitated; quickly standing up before therapist ready and situated with lines  Ambulation/Gait Ambulation/Gait assistance: Min guard;Min assist Gait Distance (Feet): 200 Feet Assistive device: Rolling walker (2 wheeled) Gait Pattern/deviations: Step-to pattern;Step-through pattern;Decreased stride length;Trunk flexed;Drifts right/left Gait velocity: decreased   General Gait Details: reports mild dizziness with ambulation; cueing throughout for safety and obstacle navigation   Stairs             Wheelchair Mobility    Modified Rankin (Stroke Patients Only)       Balance Overall balance assessment: Needs assistance Sitting-balance support: No upper extremity supported;Feet supported Sitting balance-Leahy Scale: Good     Standing balance support: No upper extremity supported;During functional activity Standing balance-Leahy Scale: Fair Standing balance comment: no dizziness; decreased safety awareness; impulsive                            Cognition Arousal/Alertness: Awake/alert Behavior During Therapy: WFL for tasks assessed/performed;Impulsive Overall Cognitive Status: Within Functional Limits for tasks assessed                                 General Comments: impulsive and slightly agitated      Exercises      General Comments General comments (skin integrity, edema, etc.): monitored SPO2 throughout session; on 6L of O2; pt's O2 started in the low 90s and dropped to the high 70s after  functional activity; educated pt on deep breathing; took about 5 minutes for O2 level to return to low 90s      Pertinent Vitals/Pain Pain Assessment: 0-10 Faces Pain Scale: Hurts a little bit Pain Location: R pec region Pain Descriptors / Indicators: Aching;Discomfort;Shooting Pain Intervention(s): Limited activity within  patient's tolerance;Monitored during session;Repositioned    Home Living                      Prior Function            PT Goals (current goals can now be found in the care plan section) Acute Rehab PT Goals Patient Stated Goal: to go home Progress towards PT goals: Progressing toward goals    Frequency    Min 2X/week      PT Plan Current plan remains appropriate    Co-evaluation              AM-PAC PT "6 Clicks" Mobility   Outcome Measure  Help needed turning from your back to your side while in a flat bed without using bedrails?: A Little Help needed moving from lying on your back to sitting on the side of a flat bed without using bedrails?: A Little Help needed moving to and from a bed to a chair (including a wheelchair)?: A Little Help needed standing up from a chair using your arms (e.g., wheelchair or bedside chair)?: A Little Help needed to walk in hospital room?: A Little Help needed climbing 3-5 steps with a railing? : A Lot 6 Click Score: 17    End of Session Equipment Utilized During Treatment: Gait belt;Oxygen Activity Tolerance: Patient tolerated treatment well;Patient limited by fatigue Patient left: with call bell/phone within reach;in bed;with bed alarm set Nurse Communication: Mobility status PT Visit Diagnosis: Unsteadiness on feet (R26.81);Other abnormalities of gait and mobility (R26.89);Muscle weakness (generalized) (M62.81)     Time: 0630-1601 PT Time Calculation (min) (ACUTE ONLY): 12 min  Charges:  $Gait Training: 8-22 mins                     Christie Copley W,PT [place] Pager:  [phone]  Office:  [place] 12/10/2019, 11:36 AM

## 2019-12-10 NOTE — Plan of Care (Signed)
  Problem: Activity: Goal: Ability to tolerate increased activity will improve Outcome: Progressing   Problem: Cardiac: Goal: Ability to achieve and maintain adequate cardiovascular perfusion will improve Outcome: Progressing   Problem: Pain Managment: Goal: General experience of comfort will improve Outcome: Progressing

## 2019-12-10 NOTE — Progress Notes (Signed)
Palliative Medicine RN Note: Rec'd a call from Mrs Vandevoort [phone]), who had questions and concerns about payment to SNF/insurance authorization. I explained the process as best I could, and we agreed that it would be a good plan for me to call the [facility] liaison on for Mr Hereford so she could answer Mrs Hatlestad's questions.  Spoke with Ebony Hail w TOC. She will call Mrs Cabello. Confirmed that the number is correct in the demographics.  Marjie Skiff Rondell Frick, RN, BSN, [facility] Palliative Medicine Team 12/10/2019 1:37 PM Office [phone]

## 2019-12-10 NOTE — Progress Notes (Addendum)
Progress Note  Patient Name: [name] Date of Encounter: 12/10/2019  Primary Cardiologist: Candee Furbish, MD   Subjective   Patient put out 1.5L urine overnight. Pressures still soft this AM. Denies chest pain. On 6L O2.   Inpatient Medications    Scheduled Meds: . acetaminophen  500 mg Oral TID  . aspirin EC  81 mg Oral Daily  . atorvastatin  10 mg Oral Daily  . bethanechol  10 mg Oral TID  . citalopram  10 mg Oral Daily  . digoxin  0.125 mg Oral Daily  . furosemide  80 mg Intravenous BID  . insulin aspart  0-9 Units Subcutaneous TID WC  . pantoprazole  40 mg Oral Q0600  . sodium chloride flush  3 mL Intravenous Q12H  . tamsulosin  0.4 mg Oral QPC supper  . umeclidinium bromide  1 puff Inhalation Daily   Continuous Infusions:  PRN Meds: acetaminophen **OR** acetaminophen, albuterol, ondansetron **OR** ondansetron (ZOFRAN) IV   Vital Signs    Vitals:   12/09/19 1628 12/09/19 1958 12/10/19 0042 12/10/19 0625  BP: 105/68 (!) 104/59 (!) 93/56 (!) 90/52  Pulse: 70 70 75 74  Resp: 16 17 15 19   Temp: 97.7 F (36.5 C) 98.6 F (37 C) 97.6 F (36.4 C) 97.8 F (36.6 C)  TempSrc: Oral Oral Oral Oral  SpO2: 95% 92% 90% 90%  Weight:    69.8 kg  Height:        Intake/Output Summary (Last 24 hours) at 12/10/2019 4709 Last data filed at 12/09/2019 2128 Gross per 24 hour  Intake 706 ml  Output 1550 ml  Net -844 ml   Last 3 Weights 12/10/2019 12/09/2019 12/08/2019  Weight (lbs) 153 lb 14.4 oz 153 lb 14.4 oz 153 lb 9.6 oz  Weight (kg) 69.809 kg 69.809 kg 69.673 kg      Telemetry    V-paced rhythm, HR 70s, PVCs - Personally Reviewed  ECG    No new - Personally Reviewed  Physical Exam   GEN: No acute distress.   Neck: + JVD Cardiac: RRR, no murmurs, rubs, or gallops.  Respiratory: rhonchi; 6L O2 GI: Soft, nontender, non-distended  MS: 2+ LL edema; No deformity. Neuro:  Nonfocal  Psych: Normal affect   Labs    High Sensitivity Troponin:   Recent Labs  Lab  11/15/19 1123 11/15/19 1238 11/15/19 1636 12/06/19 1824 12/06/19 2140  TROPONINIHS 19* 18* 21* 19* 19*      Chemistry Recent Labs  Lab 12/07/19 0029 12/08/19 0415 12/09/19 0401  NA 140 139 137  K 3.7 3.5 4.1  CL 99 99 97*  CO2 30 31 30   GLUCOSE 173* 151* 138*  BUN 20 17 20   CREATININE 1.10 1.02 1.01  CALCIUM 8.5* 8.3* 8.4*  GFRNONAA >60 >60 >60  GFRAA >60 >60 >60  ANIONGAP 11 9 10      Hematology Recent Labs  Lab 12/06/19 1824 12/07/19 0029 12/08/19 0415  WBC 6.8 6.2 7.2  RBC 4.29 4.43 4.26  HGB 10.2* 10.3* 10.2*  HCT 36.6* 37.5* 35.6*  MCV 85.3 84.7 83.6  MCH 23.8* 23.3* 23.9*  MCHC 27.9* 27.5* 28.7*  RDW 27.4* 27.3* 26.6*  PLT 295 305 286    BNP Recent Labs  Lab 12/07/19 0029  BNP 1,365.1*     DDimer No results for input(s): DDIMER in the last 168 hours.   Radiology    No results found.  Cardiac Studies   TTE 11/14/2019 1. LVEF is severely depressed with  diffuse hypokinesis, worse in the  septal and apical walls Compared to report from Nov 2020 echo, LVEF is  worse (apical views foreshortened in 2020 exam which makes assessment  difficult). Left ventricular ejection  fraction, by estimation, is 25 to 30%. The left ventricle has severely  decreased function. The left ventricle demonstrates global hypokinesis.  There is mild left ventricular hypertrophy. Left ventricular diastolic  parameters are indeterminate.  2. Right ventricular systolic function is moderately reduced. The right  ventricular size is severely enlarged. There is moderately elevated  pulmonary artery systolic pressure.  3. Left atrial size was severely dilated.  4. Right atrial size was severely dilated.  5. A 33 mm [employer] Mosai bioprosthesisiis present Peak and mean  gradients through the vave are 15 and 6 mm Hg respectively, both lower  than previous echo report in 2020.Marland Kitchen The mitral valve has been  repaired/replaced. Trivial mitral valve regurgitation.  6. The  aortic valve is abnormal. Aortic valve regurgitation is not  visualized. Mild to moderate aortic valve sclerosis/calcification is  present, without any evidence of aortic stenosis.  7. Aortic root is 51 mm Previous echo measured echo above this; current  exam did not image that high.. Aortic dilatation noted. Aneurysm of the  aortic root. There is moderate to severe dilatation of the aortic root  measuring 51 mm.  8. The inferior vena cava is dilated in size with <50% respiratory  variability, suggesting right atrial pressure of 15 mmHg.   Patient Profile     74 y.o. male with CHF (25% with end stage CM), Afib s/p AVN ablation with BiV ICD, severe MR s/p MVR, LAA clipping, severe COPD, frailty admitted 12/06/2019 after a fall that was likely orthostatic per report  Assessment & Plan    Fall - Sounds orthostatic in nature. He fell after leaving the bathroom. He felt dzzy with blurred vision but no syncope. Suspect fall due to hypotension from medications - Troponin negative - EKG with BiV paced rhythm - CTA without dissection - Orthostatics negative - PT/OT - Palliative care. Plan at d/c SNF for rehab  Systolic heart failure, EF 25%/End stage CM/severe MR s/p MVR - On admission CT with pulmonary edema. Due to borderline pressure diuretics initially held. When seen by cardiology noted to be volume up and he was started on IV lasix BID. BNP came back at 1,365 - It was well documented that patient does not tolerate multiple medications due to hypotension and he is not a candidate for advanced therapy due to multiple comorbidities - Has not tolerate Ace/ARB in the past - Coreg caused hypotension so this was stopped and Digoxin was added.  - Digoxin level this am - He is still on IV lasix 80mg  BID. He put out 1.5L urine overnight and is net -2.8L since admission - Weight down 2lbs - kidney function stable - He is still volume up. Would continue with diuresis.  Atypical Chest pain - Hs  troponin peaked at 21 - No further cardiac work-up  Permanent Afib s/p SVN ablation with BiV PPM - s/p LAA clipping - Not on a/c due to history of intracerebral hemorrhage - Aspirin 81mg  daily  COPD on 5L O2 at home - per IM  HTN - BP this AM 90/52  HLD - atorvastatin  For questions or updates, please contact [facility] Please consult [email or web address] for contact info under     Signed, Cadence Ninfa Meeker, PA-C  12/10/2019, 6:33 AM    History and all data  above reviewed.  Patient examined.  I agree with the findings as above.  The patient exam reveals COR:RRR  ,  Lungs: Few basilar crackles  ,  Abd: Positive bowel sounds, no rebound no guarding, Ext Mild edema.   .  All available labs, radiology testing, previous records reviewed. Agree with documented assessment and plan. Continue IV diuresis likely for one more day.  Need to balance overall volume overload against his significant symptoms related to orthostasis.    Jeneen Rinks Lynnelle Mesmer  10:20 AM  12/10/2019

## 2019-12-10 NOTE — Progress Notes (Signed)
PROGRESS NOTE    [name]  JWJ:[id number] DOB: [birth year] DOA: 12/06/2019 PCP: [name], NP    Chief Complaint  Patient presents with  . Fall  . Shortness of Breath    Brief Narrative:  HPI per Dr. Garrel Ridgel is a 74 y.o. male with medical history significant for end-stage chronic combined systolic and diastolic CHF (EF 62-13%) s/p BiV ICD, chronic severe COPD/emphysema with chronic respiratory failure on 5 L of home O2 via [place], permanent A. fib on aspirin alone (Hx of ICH while on Coumadin) s/p AV nodal ablation, severe mitral regurgitation s/p bioprosthetic mitral valve replacement, pulmonary hypertension, non-small cell left lung cancer s/p wedge resection, type 2 diabetes, hypertension, and hyperlipidemia who presents to the ED for evaluation of chest pain.  Patient recently hospitalized from 11/13/2019-11/25/2019 for acute on chronic CHF.  He was diuresed while in hospital.  Therapy evaluation recommended SNF however patient declined and he was discharged to home with home health and increase O2 requirement to 5 L via [place].  He was discharged on Lasix 60 mg twice daily.  Patient states around 44 AM earlier today (12/06/2019) he developed significant chest pain across his chest when he was walking down the hall.  He went into the kitchen and says he blacked out and fell to the ground.  He denied any significant injury.  He has been having continued chest pain since.  He came to the ED for further evaluation.  He reports continued swelling in both of his lower extremities.  He denies any significant change in his breathing.  He denies any cough.  He denies any subjective fevers, chills, diaphoresis, abdominal pain, or dysuria.  ED Course:  Initial vitals showed BP 105/61, pulse 70, RR 18, temp 97.2 Fahrenheit, SPO2 96% on 15 L NRB.  Patient was weaned down to 6 L O2 via [place].  Labs are notable for WBC 6.8, hemoglobin 10.2, platelets 295,000, sodium 142, potassium 4.9,  bicarb 29, BUN 22, creatinine 1.18, serum glucose 120, magnesium 1.9, high-sensitivity troponin I 19.  EKG shows V paced rhythm.  SARS-CoV-2 PCR is negative.  2 view chest x-ray shows stable cardiomegaly, diffuse interstitial infiltrates, small bilateral pleural effusions, and increased left basilar atelectasis versus consolidation.  Thoracic spine x-ray is negative for acute findings.  Lumbar spine x-ray is negative for acute/traumatic lumbar spine pathology.  CT head and cervical spine without contrast are negative for acute intracranial hemorrhage or acute/traumatic cervical spine pathology.  Acute or possibly subacute fracture of the left clavicular head is noted.  CT chest/abdomen/pelvis dissection study is negative for evidence of acute vascular abnormality, thoracic aortic dissection, or PE.  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  Small bilateral pleural effusions with partially loculated right-sided pleural effusion noted.  Masslike appearance of the cecum with multiple small mildly enlarged regional lymph nodes noted.  Distended urinary bladder and findings of suspected underlying cirrhosis are seen.  Severe emphysematous changes with infectious or reactive bronchiolitis at the lung bases also reported.  The hospitalist service was consulted to me for further evaluation and management.   Assessment & Plan:   Principal Problem:   Syncope Active Problems:   COPD GOLD II   Hypertension associated with diabetes ([place])   Hyperlipidemia associated with type 2 diabetes mellitus (HCC)   Permanent atrial fibrillation   ICD (implantable cardioverter-defibrillator), biventricular, in situ   H/O intracranial hemorrhage   Acute on chronic respiratory failure with hypoxia (HCC)   Type 2 diabetes  mellitus (HCC)   Chronic combined systolic and diastolic CHF (congestive heart failure) (HCC)   Depression, recurrent (HCC)   Pressure injury of skin   1  Fall at home/  preSyncope/orthostasis Patient reported to admitting physician that he had a syncopal episode at home from standing position after walking a short distance however denies any significant injury.  Patient did complain of lightheadedness and dizziness prior to the fall and patient noted to have increasing falls recently.  Patient however denied syncopal episode to me as well as to the cardiologist.  CT head done negative for acute intracranial hemorrhage.  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  Acute/traumatic cervical spine pathology.  Fracture of left clavicular head, acute or possibly subacute new since prior CT of 11/07/2019.  Partially visualized moderate to large left pleural effusion or hemothorax.  Due to concern for possible syncopal episode in the setting of complicated cardiac history cardiology was consulted and assessed the patient.  It was felt patient's fall at home likely orthostatic versus vasovagal.  Orthostatic vital signs ordered but not done.  Patient noted on presentation to the ED to have soft blood pressures with systolics in the 25D to 66Y.  Patient diuretics held on admission as well as Toprol-XL.  Patient denies any dizzy spells.  Blood pressure improved.  Patient on diuretics and was rechallenged with low-dose Coreg per cardiology however patient developed hypotension and low-dose low dose Coreg discontinued.  Digoxin started.  Monitor closely.  Cardiology recommending another day of IV diuretics and could likely transition to oral diuretics tomorrow.  PT/OT.  2.  Acute on chronic respiratory failure with hypoxia likely secondary to acute on chronic combined systolic and diastolic CHF/end-stage CHF/status post AICD/bilateral pleural effusions/pulmonary hypertension/chronic COPD Patient noted to be hypoxic at home of 74 to 80% on 5 L O2 on EMS arrival.  At time of admission oxygen sats had improved on 5 L O2.  Patient noted with  end-stage CHF and severe COPD/emphysema with CT findings of pulmonary edema bilateral effusions.  Due to borderline blood pressure concern for orthostatic etiology for falls.  Diuretics held on admission as well as beta-blocker.  Patient seen in consultation by cardiology.  Patient noted to be volume overloaded on examination.  Patient started on Lasix 80 mg IV every 12 hours per cardiology recommendations.  Urine output of 1.5 L over the past 24 hours.  Patient noted not to be able to tolerate ACE inhibitors/ARB in the past.  Patient rechallenged with Coreg (12/08/2019) however patient developed hypotension and as such Coreg has been discontinued per cardiology.  Patient started on digoxin.  Patient will need 1 more day of IV diuretics per cardiology recommendations.  Patient noted not to be a candidate for advanced therapies.  Palliative care has also been consulted for goals of care.  Patient will need to go to SNF with palliative care following.  Cardiology following and appreciate input and recommendations.  3.  Permanent A. fib status post AV nodal ablation Per cardiology questionable syncope likely not secondary to an arrhythmia.  Patient status post left atrial appendage clipping.  Heart rate currently rate controlled.  Beta-blocker initially held due to signs of orthostasis.  Patient was challenged again with Coreg on 12/08/2019, however developed hypotension and as such Coreg was discontinued and patient placed on digoxin.  Patient not an anticoagulation candidate due to prior history of intracerebral hemorrhage.  Aspirin 81 mg daily.  Appreciate cardiology input and recommendations.  4.  Severe COPD with chronic respiratory failure Continue Incruse Ellipta.  Albuterol nebs as needed.  Continue O2.  Patient noted to be on 6 L nasal cannula prior to admission.  Place on scheduled Xopenex.  [place].  5.  Diabetes mellitus type 2 Hemoglobin A1c 7.4 on 11/14/2019.  CBG of 136 this morning.  Continue to  hold oral hypoglycemic agents.  Sliding scale insulin.    6.  Hyperlipidemia Continue statin.  7.  Hypertension Due to concerns that falls likely due to orthostasis Toprol-XL has been held.  Patient on diuretics.  Patient started on Coreg 3.125 mg twice daily per cardiology on 12/08/2019 however patient noted to have hypotensive bouts and as such Coreg has been discontinued per cardiology..  Follow.  8.  History of stage I non-small cell lung cancer left Status post wedge resection.  Outpatient follow-up.  9.  Masslike appearance of the cecum Per CT imaging.  Admitting physician discussed with patient.  May consider further nonemergent evaluation with colonoscopy however given patient's severe cardiac and pulmonary chronic medical problems with recent involvement of palliative care likely not to be beneficial at this point in time.  Palliative care has been consulted.   DVT prophylaxis: SCDs Code Status: DNR Family Communication: Updated patient.  No family at bedside. Disposition:   Status is: Inpatient    Dispo: The patient is from: Home              Anticipated d/c is to: SNF with palliative care following.              Anticipated d/c date is: Hopefully tomorrow once cleared by cardiology.              Patient currently on IV diuretics, presenting with fall felt likely secondary to orthostasis, likely needing placement as patient with multiple falls at home and wife unable to take care of patient.  Patient likely needs palliative care following at facility..        Consultants:   Cardiology: Dr. Audie Box 12/07/2019  Palliative care  Procedures:  CT head CT C-spine 12/06/2019  CT angiogram chest and abdomen 12/06/2019  Plain films of the L-spine 12/06/2019  Plain films of the T-spine 12/06/2019  Antimicrobials:   None   Subjective: Patient sitting up on the side of the bed.  Has any significant shortness of breath.  States her right-sided chest pain improving.   Denies any lightheadedness or dizziness.  Patient noted to have improvement in sats when he takes in a deep breath or with coughing.    Objective: Vitals:   12/10/19 0804 12/10/19 0816 12/10/19 0853 12/10/19 1114  BP: 107/60   111/74  Pulse: 76  75 74  Resp: 18   18  Temp: 97.7 F (36.5 C)   97.6 F (36.4 C)  TempSrc: Oral   Oral  SpO2: 93% 92%  97%  Weight:      Height:        Intake/Output Summary (Last 24 hours) at 12/10/2019 1136 Last data filed at 12/10/2019 0804 Gross per 24 hour  Intake 822 ml  Output 1100 ml  Net -278 ml   Filed Weights   12/08/19 0545 12/09/19 0403 12/10/19 0625  Weight: 69.7 kg 69.8 kg 69.8 kg    Examination:  General exam: Chronically ill-appearing. Respiratory system: Decreased breath sounds in the bases.  Some scattered coarse breath sounds.  No wheezing.  Speaking in full sentences.  Normal respiratory effort. Cardiovascular system: RRR no murmurs rubs  or gallops.  Positive JVD.  1+ bilateral lower extremity edema.  Right chest wall is tender to palpation.  Gastrointestinal system: Abdomen is soft, nontender, nondistended, positive bowel sounds.  No rebound.  No guarding.  Central nervous system: Alert and oriented. No focal neurological deficits. Extremities: Symmetric 5 x 5 power. Skin: No rashes, lesions or ulcers Psychiatry: Judgement and insight appear normal. Mood & affect appropriate.     Data Reviewed: I have personally reviewed following labs and imaging studies  CBC: Recent Labs  Lab 12/06/19 1824 12/07/19 0029 12/08/19 0415  WBC 6.8 6.2 7.2  NEUTROABS  --   --  5.6  HGB 10.2* 10.3* 10.2*  HCT 36.6* 37.5* 35.6*  MCV 85.3 84.7 83.6  PLT 295 305 536    Basic Metabolic Panel: Recent Labs  Lab 12/06/19 1824 12/07/19 0029 12/08/19 0415 12/09/19 0401 12/10/19 0617  NA 142 140 139 137 137  K 4.9 3.7 3.5 4.1 3.9  CL 102 99 99 97* 95*  CO2 29 30 31 30 30   GLUCOSE 120* 173* 151* 138* 137*  BUN 22 20 17 20 21     CREATININE 1.18 1.10 1.02 1.01 1.03  CALCIUM 8.6* 8.5* 8.3* 8.4* 8.3*  MG 1.9  --  1.8 2.1  --     GFR: Estimated Creatinine Clearance: 64 mL/min (by C-G formula based on SCr of 1.03 mg/dL).  Liver Function Tests: No results for input(s): AST, ALT, ALKPHOS, BILITOT, PROT, ALBUMIN in the last 168 hours.  CBG: Recent Labs  Lab 12/09/19 1100 12/09/19 1627 12/09/19 2126 12/10/19 0801 12/10/19 1111  GLUCAP 144* 131* 206* 136* 163*     Recent Results (from the past 240 hour(s))  SARS Coronavirus 2 by RT PCR (hospital order, performed in [facility] hospital lab) Nasopharyngeal Nasopharyngeal Swab     Status: None   Collection Time: 12/06/19  6:24 PM   Specimen: Nasopharyngeal Swab  Result Value Ref Range Status   SARS Coronavirus 2 NEGATIVE NEGATIVE Final    Comment: (NOTE) SARS-CoV-2 target nucleic acids are NOT DETECTED. The SARS-CoV-2 RNA is generally detectable in upper and lower respiratory specimens during the acute phase of infection. The lowest concentration of SARS-CoV-2 viral copies this assay can detect is 250 copies / mL. A negative result does not preclude SARS-CoV-2 infection and should not be used as the sole basis for treatment or other patient management decisions.  A negative result may occur with improper specimen collection / handling, submission of specimen other than nasopharyngeal swab, presence of viral mutation(s) within the areas targeted by this assay, and inadequate number of viral copies (<250 copies / mL). A negative result must be combined with clinical observations, patient history, and epidemiological information. Fact Sheet for Patients:   StrictlyIdeas.no Fact Sheet for Healthcare Providers: BankingDealers.co.za This test is not yet approved or cleared  by the [place] FDA and has been authorized for detection and/or diagnosis of SARS-CoV-2 by FDA under an Emergency Use Authorization  (EUA).  This EUA will remain in effect (meaning this test can be used) for the duration of the COVID-19 declaration under Section 564(b)(1) of the Act, 21 U.S.C. section 360bbb-3(b)(1), unless the authorization is terminated or revoked sooner. Performed at [facility], [place] [address] [place], [place] [address]          Radiology Studies: No results found.      Scheduled Meds: . acetaminophen  500 mg Oral TID  . aspirin EC  81 mg Oral Daily  .  atorvastatin  10 mg Oral Daily  . bethanechol  10 mg Oral TID  . citalopram  10 mg Oral Daily  . digoxin  0.125 mg Oral Daily  . furosemide  80 mg Intravenous BID  . insulin aspart  0-9 Units Subcutaneous TID WC  . pantoprazole  40 mg Oral Q0600  . sodium chloride flush  3 mL Intravenous Q12H  . tamsulosin  0.4 mg Oral QPC supper  . umeclidinium bromide  1 puff Inhalation Daily   Continuous Infusions:    LOS: 4 days    Time spent: 40 minutes    Irine Seal, MD [facility]   To contact the attending provider between 7A-7P or the covering provider during after hours 7P-7A, please log into the web site [email or web address] and access using universal [place] password for that web site. If you do not have the password, please call the hospital operator.  12/10/2019, 11:36 AM

## 2019-12-11 ENCOUNTER — Telehealth: Payer: Medicare Other | Admitting: Cardiology

## 2019-12-11 LAB — BASIC METABOLIC PANEL
Anion gap: 13 (ref 5–15)
BUN: 21 mg/dL (ref 8–23)
CO2: 29 mmol/L (ref 22–32)
Calcium: 8.5 mg/dL — ABNORMAL LOW (ref 8.9–10.3)
Chloride: 93 mmol/L — ABNORMAL LOW (ref 98–111)
Creatinine, Ser: 1.2 mg/dL (ref 0.61–1.24)
GFR calc Af Amer: >60 mL/min (ref >60)
GFR calc non Af Amer: >60 mL/min (ref >60)
Glucose, Bld: 149 mg/dL — ABNORMAL HIGH (ref 70–99)
Potassium: 3.9 mmol/L (ref 3.5–5.1)
Sodium: 135 mmol/L (ref 135–145)

## 2019-12-11 LAB — SARS CORONAVIRUS 2 (TAT 6-24 HRS): SARS Coronavirus 2: NEGATIVE

## 2019-12-11 LAB — GLUCOSE, CAPILLARY
Glucose-Capillary: 174 mg/dL — ABNORMAL HIGH (ref 70–99)
Glucose-Capillary: 157 mg/dL — ABNORMAL HIGH (ref 70–99)
Glucose-Capillary: 161 mg/dL — ABNORMAL HIGH (ref 70–99)
Glucose-Capillary: 174 mg/dL — ABNORMAL HIGH (ref 70–99)

## 2019-12-11 MED ORDER — LEVALBUTEROL HCL 0.63 MG/3ML IN NEBU
0.6300 mg | INHALATION_SOLUTION | Freq: Two times a day (BID) | RESPIRATORY_TRACT | Status: DC
  Administered 2019-12-11 – 2019-12-12 (×3): 0.63 mg via RESPIRATORY_TRACT
  Filled 2019-12-11 (×3): qty 3

## 2019-12-11 MED ORDER — FUROSEMIDE 80 MG PO TABS
80.0000 mg | ORAL_TABLET | Freq: Two times a day (BID) | ORAL | Status: DC
  Administered 2019-12-11 – 2019-12-12 (×3): 80 mg via ORAL
  Filled 2019-12-11 (×3): qty 1

## 2019-12-11 NOTE — Progress Notes (Signed)
This chaplain responded to PMT consult for Pt. [name].  The chaplain understands from communication with the Pt., the Pt. wife-[name] will act in the role of HCPOA for the Pt. without documentation.  The Pt. shared with the chaplain no further needs at this time.

## 2019-12-11 NOTE — Plan of Care (Signed)
  Problem: Activity: Goal: Ability to tolerate increased activity will improve Outcome: Progressing   Problem: Pain Managment: Goal: General experience of comfort will improve Outcome: Progressing   Problem: Safety: Goal: Ability to remain free from injury will improve Outcome: Progressing

## 2019-12-11 NOTE — Discharge Summary (Signed)
Physician Discharge Summary  [name] HBZ:[id number] DOB: Mar 06, 1946 DOA: 12/06/2019  PCP: [name], NP  Admit date: 12/06/2019 Discharge date: 12/12/2019  Admitted From: home Disposition:  SNF  Recommendations for Outpatient Follow-up:  1. Follow up with PCP in 1-2 weeks 2. Please obtain BMP/CBC in one week 3. Please follow up on the following pending results:  Home Health:No  Equipment/Devices: None  Discharge Condition: Stable Code Status: DNR Diet recommendation:  Diet Order            Diet - low sodium heart healthy        Diet heart healthy/carb modified Room service appropriate? Yes; Fluid consistency: Thin  Diet effective now               Brief/Interim Summary:  74 y.o.malewith medical history significant forend-stage chronic combined systolic and diastolic CHF (EF 10-17%) s/pBiVICD, chronic severe COPD/emphysema with chronic respiratory failure on 5 L of home O2 via [place], permanent A. fib on aspirin alone (Hxof ICH while on Coumadin) s/p AV nodal ablation, severe mitral regurgitation s/p bioprosthetic mitral valve replacement, pulmonary hypertension, non-small cell left lung cancer s/p wedge resection, type 2 diabetes, hypertension, and hyperlipidemia who presents to the ED for evaluation of chest pain.  Was recently hospitalized from 11/13/2019-11/25/2019 for acute on chronic CHF ,PT recommended SNF however patient declined and was discharged to home w/ HH on 5l [place] and Lasix 60 mg twice daily.  On 5/28 developed developed significant chest pain while walking.  Seen in the ED initially hypoxic needing 15 L nonrebreather went down to 6 L. EKG shows V paced rhythm. SARS-CoV-2 PCR is negative.  Chest x-ray showed diffuse interstitial infiltrates, small bilateral pleural effusion, cardiomegaly and increased left basilar atelectasis versus consolidation thoracic spine x-ray no acute finding lumbar spine x-ray no acute/traumatic lumbar spine pathology CT Head and  C-spine stable. CT chest/abdomen/pelvis dissection study is negative for evidence of acute vascular abnormality, thoracic aortic dissection, or PE.Masslike appearance of the cecum with multiple small mildly enlarged regional lymph nodes noted. Distended urinary bladder and findings of suspected underlying cirrhosis are seen. Severe emphysematous changes with infectious or reactive bronchiolitis at the lung bases also reported Patient seen by cardiology has been diuresed with IV Lasix. Patient had issue with hypotension did not tolerate ACE ARB beta-blocker was discontinued placed on digoxin.  Troponin peaked at 21 no further ischemic work-up by cardiology. Seen by PT OT and recommended skilled nursing facility.  Discharge Diagnoses:  Principal Problem:   Syncope Active Problems:   COPD GOLD II   Hypertension associated with diabetes ([place])   Hyperlipidemia associated with type 2 diabetes mellitus ([place])   Permanent atrial fibrillation   ICD (implantable cardioverter-defibrillator), biventricular, in situ   H/O intracranial hemorrhage   Acute on chronic respiratory failure with hypoxia ([facility])   Type 2 diabetes mellitus ([facility])   Chronic combined systolic and diastolic CHF (congestive heart failure) (H[facility])   Depression, recurrent ([facility])   Pressure injury of skin  Fall/suspected presyncope/orthostasis: Reportedly fell from a standing position after walking a short distance and but no injury.  Patient had extensive imaging with chest x-ray, x-ray thoracic and lumbar spine, CT head and cervical spine no acute fracture.  Orthostasis versus vasovagal, blood pressure soft and Coreg discontinued.  He has been diuresed for his CHF.  Now on oral Lasix and tolerating.  He is awaiting for placement.   Acute on chronic hypoxic respiratory failure/acute on chronic combined systolic and diastolic CHF in the  setting of end-stage CHF/AICD in place/pulmonary hypertension/chronic COPD: Normally on 5 nasal cannula,  currently at home oxygen setting, aggressively diuresed now on oral Lasix, continue the same.  Adding beta-blocker due to hypotension.  Overall prognosis guarded to poor.  Permanent atrial fibrillation status post AV nodal ablation unable to-tolerate beta-blocker.  Severe COPD with chronic hypoxic respiratory failure on 5 to nasal cannula continue bronchodilators  Type 2 diabetes mellitus A1c 7.4 recently, sugar is stable holding p.o. meds, continue sliding scale  HLD continue statin  Hypertension beta-blocker discontinued due to hypotension.  Blood pressure stable  History of stage I non-small cell lung cancer left status post wedge resection outpatient follow-up  Masslike appearance of the cecum per CT imaging, admitting physician discussed with the patient could consider further nonemergent evaluation with colonoscopy however GIVEN patient's severe cardiac and pulmonary chronic medical problem may not be ideal to pursue as inpatient, if patient interested follow-up outpatient, seen by palliative care.  He will need to see PCP and  Pressure Ulcer: Pressure Injury 12/08/19 Coccyx Mid;Right;Left Stage 1 -  Intact skin with non-blanchable redness of a localized area usually over a bony prominence. redness or discoloration on coccyx (Active)  12/08/19 0700  Location: Coccyx  Location Orientation: Mid;Right;Left  Staging: Stage 1 -  Intact skin with non-blanchable redness of a localized area usually over a bony prominence.  Wound Description (Comments): redness or discoloration on coccyx  Present on Admission: Yes    Consults: CARDIOLOGY  Subjective: Resting, on home o2 setting, no nausea or vomiting. No new complaints Discharge Exam: Vitals:   12/12/19 1122 12/12/19 1250  BP:  (!) 96/55  Pulse: 74 75  Resp:  18  Temp:    SpO2:  90%   General:  AAOX3, heard of hearing, on 5l [place] home setting Cardiovascular: RRR, S1/S2 +, no rubs, no gallops Respiratory: CTA b/l non tender,  no crackles Abdominal: Soft, NT, ND, bowel sounds + Extremities: no edema, no cyanosis  Discharge Instructions  Discharge Instructions    (HEART FAILURE PATIENTS) Call MD:  Anytime you have any of the following symptoms: 1) 3 pound weight gain in 24 hours or 5 pounds in 1 week 2) shortness of breath, with or without a dry hacking cough 3) swelling in the hands, feet or stomach 4) if you have to sleep on extra pillows at night in order to breathe.   Complete by: As directed    Diet - low sodium heart healthy   Complete by: As directed    Discharge instructions   Complete by: As directed    Please call call MD or return to ER for similar or worsening recurring problem that brought you to hospital or if any fever,nausea/vomiting,abdominal pain, uncontrolled pain, chest pain,  shortness of breath or any other alarming symptoms.  Please follow-up your doctor as instructed in a week time and call the office for appointment.  Please avoid alcohol, smoking, or any other illicit substance and maintain healthy habits including taking your regular medications as prescribed.  You were cared for by a hospitalist during your hospital stay. If you have any questions about your discharge medications or the care you received while you were in the hospital after you are discharged, you can call the unit and ask to speak with the hospitalist on call if the hospitalist that took care of you is not available.  Once you are discharged, your primary care physician will handle any further medical issues. Please note that NO REFILLS  for any discharge medications will be authorized once you are discharged, as it is imperative that you return to your primary care physician (or establish a relationship with a primary care physician if you do not have one) for your aftercare needs so that they can reassess your need for medications and monitor your lab values   Increase activity slowly   Complete by: As directed       Allergies as of 12/12/2019      Reactions   Anticoagulant Compound Other (See Comments)   Pt had intracranial bleed, therefore all anticoagulation is contraindicated per Dr. Ron Parker   Other Other (See Comments)   Per Dr. Halford Chessman (Surgeon): stated that the patient cannot be put under for any surgery, as he has an enlarged aorta. He would stand only a 50/50 chance of surviving. He has lung issues, diminished lung tissue, COPD, and emphysema.   Warfarin Sodium Other (See Comments)   Pt had intracranial bleed, therefore all anticoagulation is contraindicated per Dr. Ron Parker      Medication List    STOP taking these medications   amoxicillin 500 MG capsule Commonly known as: AMOXIL   metoprolol succinate 25 MG 24 hr tablet Commonly known as: TOPROL-XL     TAKE these medications   acetaminophen 500 MG tablet Commonly known as: TYLENOL Take 2 tablets (1,000 mg total) by mouth every 8 (eight) hours. What changed: when to take this   aspirin EC 81 MG tablet Take 1 tablet (81 mg total) by mouth daily.   atorvastatin 10 MG tablet Commonly known as: LIPITOR Take 10 mg by mouth daily.   bethanechol 10 MG tablet Commonly known as: URECHOLINE Take 1 tablet (10 mg total) by mouth 3 (three) times daily.   Breztri Aerosphere 160-9-4.8 MCG/ACT Aero Generic drug: Budeson-Glycopyrrol-Formoterol Inhale 2 puffs into the lungs in the morning and at bedtime.   citalopram 10 MG tablet Commonly known as: CELEXA Take 1 tablet (10 mg total) by mouth daily.   cyanocobalamin 1000 MCG tablet Take 1 tablet (1,000 mcg total) by mouth daily.   digoxin 0.125 MG tablet Commonly known as: LANOXIN Take 1 tablet (0.125 mg total) by mouth daily.   furosemide 80 MG tablet Commonly known as: LASIX Take 1 tablet (80 mg total) by mouth 2 (two) times daily. What changed:   medication strength  how much to take   levalbuterol 0.63 MG/3ML nebulizer solution Commonly known as: XOPENEX Take 3 mLs  (0.63 mg total) by nebulization 2 (two) times daily.   metFORMIN 1000 MG tablet Commonly known as: GLUCOPHAGE TAKE 1 TABLET BY MOUTH TWICE A DAY WITH MEALS   nitroGLYCERIN 0.4 MG SL tablet Commonly known as: NITROSTAT Place 0.4 mg under the tongue every 5 (five) minutes x 3 doses as needed for chest pain.   pantoprazole 40 MG tablet Commonly known as: PROTONIX Take 1 tablet (40 mg total) by mouth daily at 6 (six) AM.   potassium chloride SA 20 MEQ tablet Commonly known as: KLOR-CON Take 2 tablets (40 mEq total) by mouth daily.   tamsulosin 0.4 MG Caps capsule Commonly known as: FLOMAX Take 1 capsule (0.4 mg total) by mouth daily after supper.       Contact information for follow-up providers    Nafziger, Tommi Rumps, NP Follow up in 1 week(s).   Specialty: Family Medicine Contact information: [place] [address] [phone]        [name], MD .   Specialty: Cardiology  Contact information: [address] [place] [address] [phone]        [name], MD .   Specialty: Cardiology Contact information: [phone] [place] [address] [phone]            Contact information for after-discharge care    Destination    [facility] Preferred SNF .   Service: Skilled Nursing Contact information: [place] [phone]                 Allergies  Allergen Reactions  . Anticoagulant Compound Other (See Comments)    Pt had intracranial bleed, therefore all anticoagulation is contraindicated per Dr. Ron Parker  . Other Other (See Comments)    Per Dr. Halford Chessman (Surgeon): stated that the patient cannot be put under for any surgery, as he has an enlarged aorta. He would stand only a 50/50 chance of surviving. He has lung issues, diminished lung tissue, COPD, and emphysema.  . Warfarin Sodium Other (See Comments)    Pt  had intracranial bleed, therefore all anticoagulation is contraindicated per Dr. Ron Parker    The results of significant diagnostics from this hospitalization (including imaging, microbiology, ancillary and laboratory) are listed below for reference.    Microbiology: Recent Results (from the past 240 hour(s))  SARS Coronavirus 2 by RT PCR (hospital order, performed in [facility] hospital lab) Nasopharyngeal Nasopharyngeal Swab     Status: None   Collection Time: 12/06/19  6:24 PM   Specimen: Nasopharyngeal Swab  Result Value Ref Range Status   SARS Coronavirus 2 NEGATIVE NEGATIVE Final    Comment: (NOTE) SARS-CoV-2 target nucleic acids are NOT DETECTED. The SARS-CoV-2 RNA is generally detectable in upper and lower respiratory specimens during the acute phase of infection. The lowest concentration of SARS-CoV-2 viral copies this assay can detect is 250 copies / mL. A negative result does not preclude SARS-CoV-2 infection and should not be used as the sole basis for treatment or other patient management decisions.  A negative result may occur with improper specimen collection / handling, submission of specimen other than nasopharyngeal swab, presence of viral mutation(s) within the areas targeted by this assay, and inadequate number of viral copies (<250 copies / mL). A negative result must be combined with clinical observations, patient history, and epidemiological information. Fact Sheet for Patients:   StrictlyIdeas.no Fact Sheet for Healthcare Providers: BankingDealers.co.za This test is not yet approved or cleared  by the [place] FDA and has been authorized for detection and/or diagnosis of SARS-CoV-2 by FDA under an Emergency Use Authorization (EUA).  This EUA will remain in effect (meaning this test can be used) for the duration of the COVID-19 declaration under Section 564(b)(1) of the Act, 21 U.S.C. section 360bbb-3(b)(1),  unless the authorization is terminated or revoked sooner. Performed at [facility], [place] [address] [place], [place] [address]   SARS CORONAVIRUS 2 (TAT 6-24 HRS) Nasopharyngeal Nasopharyngeal Swab     Status: None   Collection Time: 12/10/19  5:54 PM   Specimen: Nasopharyngeal Swab  Result Value Ref Range Status   SARS Coronavirus 2 NEGATIVE NEGATIVE Final    Comment: (NOTE) SARS-CoV-2 target nucleic acids are NOT DETECTED. The SARS-CoV-2 RNA is generally detectable in upper and lower respiratory specimens during the acute phase of infection. Negative results do not preclude SARS-CoV-2 infection, do not rule out co-infections with other pathogens, and should not be used  as the sole basis for treatment or other patient management decisions. Negative results must be combined with clinical observations, patient history, and epidemiological information. The expected result is Negative. Fact Sheet for Patients: SugarRoll.be Fact Sheet for Healthcare Providers: https://www.woods-mathews.com/ This test is not yet approved or cleared by the [place] FDA and  has been authorized for detection and/or diagnosis of SARS-CoV-2 by FDA under an Emergency Use Authorization (EUA). This EUA will remain  in effect (meaning this test can be used) for the duration of the COVID-19 declaration under Section 56 4(b)(1) of the Act, 21 U.S.C. section 360bbb-3(b)(1), unless the authorization is terminated or revoked sooner. Performed at [facility], [place] [address], [place], [place] [address]     Procedures/Studies: DG Chest 2 View  Result Date: 12/06/2019 CLINICAL DATA:  Chest pain and shortness of breath. Previous myocardial infarct congestive heart failure. EXAM: CHEST - 2 VIEW COMPARISON:  11/17/2019 FINDINGS: Moderate cardiac enlargement shows no significant change. Transvenous pacemaker remains in appropriate position. Pulmonary emphysema  again noted. Increased atelectasis or consolidation is seen in the retrocardiac left lower lobe since prior study. Small bilateral pleural effusions show no significant change. Diffuse interstitial infiltrates are also stable, consistent with diffuse interstitial edema. IMPRESSION: Increased left basilar atelectasis versus consolidation. Stable cardiomegaly, diffuse interstitial infiltrates, and small bilateral pleural effusions, consistent with congestive heart failure. Electronically Signed   By: Marlaine Hind M.D.   On: 12/06/2019 18:55   DG Chest 2 View  Result Date: 11/13/2019 CLINICAL DATA:  Shortness of breath. EXAM: CHEST - 2 VIEW COMPARISON:  November 07, 2019. FINDINGS: Stable cardiomegaly. Right-sided pacemaker is unchanged in position. No pneumothorax is noted. Stable bilateral perihilar and basilar lung opacities are noted concerning for possible edema or atypical inflammation with associated pleural effusions. Bony thorax is unremarkable. IMPRESSION: Stable bilateral perihilar and basilar lung opacities are noted concerning for possible edema or atypical inflammation with associated pleural effusions. Electronically Signed   By: Marijo Conception M.D.   On: 11/13/2019 15:38   DG Thoracic Spine 2 View  Result Date: 12/06/2019 CLINICAL DATA:  Fall.  Thoracic back pain.  Initial encounter. EXAM: THORACIC SPINE 2 VIEWS COMPARISON:  None. FINDINGS: There is no evidence of thoracic spine fracture. Alignment is normal. No focal lytic or sclerotic bone lesions identified. Mild degenerative disc disease seen in the mid and lower thoracic spine, and cervical spine. Generalized osteopenia also noted. IMPRESSION: No acute findings. Degenerative spondylosis, as described above.  Osteopenia. Electronically Signed   By: Marlaine Hind M.D.   On: 12/06/2019 19:40   DG Lumbar Spine 2-3 Views  Result Date: 11/16/2019 CLINICAL DATA:  Lower back pain. EXAM: LUMBAR SPINE - 2-3 VIEW COMPARISON:  None. FINDINGS: There is  no evidence of an acute lumbar spine fracture. Chronic loss of vertebral body height is seen at the levels of T12 and L1. Approximately 2 mm retrolisthesis of the L3 vertebral body is noted on L4. Approximately 2 mm anterolisthesis of the L4 vertebral body is seen on L5. There is moderate severity multilevel endplate sclerosis with moderate severity multilevel intervertebral disc space narrowing. Marked severity calcification of the abdominal aorta and bilateral common iliac arteries is seen. IMPRESSION: 1. Chronic loss of vertebral body height at T12 and L1. 2. Moderate severity multilevel degenerative disc disease. 3. Marked severity atherosclerotic calcification of the abdominal aorta and bilateral common iliac arteries. Electronically Signed   By: Virgina Norfolk M.D.   On: 11/16/2019 19:13   DG  Lumbar Spine Complete  Result Date: 12/06/2019 CLINICAL DATA:  74 year old male with fall and back pain. EXAM: LUMBAR SPINE - COMPLETE 4+ VIEW COMPARISON:  Lumbar spine radiograph dated 11/16/2019. FINDINGS: Five lumbar type vertebra. There is no acute fracture or subluxation of the lumbar spine. Multilevel mild chronic compression injury most prominent involving L1 and L4. Multilevel degenerative changes with bone spurring. Multilevel disc desiccation and vacuum phenomena. Grade 1 L3-L4 retrolisthesis and L4-L5 anterolisthesis. Atherosclerotic calcification of the abdominal aorta. IMPRESSION: No acute/traumatic lumbar spine pathology. Electronically Signed   By: Anner Crete M.D.   On: 12/06/2019 19:39   DG Clavicle Left  Result Date: 11/18/2019 CLINICAL DATA:  Left shoulder pain EXAM: LEFT CLAVICLE - 2+ VIEWS COMPARISON:  None. FINDINGS: Negative for fracture AC degenerative change in spurring. Narrowing of the acromial humeral distance compatible with rotator cuff disease. Mild degenerative change in the shoulder joint. Staples in the left upper lobe. IMPRESSION: Rotator cuff disease.  Negative for  fracture. Electronically Signed   By: Franchot Gallo M.D.   On: 11/18/2019 12:25   CT Head Wo Contrast  Result Date: 12/06/2019 CLINICAL DATA:  75 year old male with head trauma. EXAM: CT HEAD WITHOUT CONTRAST CT CERVICAL SPINE WITHOUT CONTRAST TECHNIQUE: Multidetector CT imaging of the head and cervical spine was performed following the standard protocol without intravenous contrast. Multiplanar CT image reconstructions of the cervical spine were also generated. COMPARISON:  Head CT dated 09/24/2015. FINDINGS: CT HEAD FINDINGS Brain: Moderate age-related atrophy and chronic microvascular ischemic changes. 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 There is no acute intracranial hemorrhage. No mass effect midline shift. No extra-axial fluid collection. Vascular: No hyperdense vessel or unexpected calcification. Skull: Normal. Negative for fracture or focal lesion. Sinuses/Orbits: No acute finding. Other: None CT CERVICAL SPINE FINDINGS Alignment: No acute subluxation. Skull base and vertebrae: No acute fracture. Osteopenia. Soft tissues and spinal canal: No prevertebral fluid or swelling. No visible canal hematoma. Disc levels:  Multilevel degenerative changes. Upper chest: Emphysema. Partially visualized moderate to large left pleural effusion or hemothorax. Lobulated right pleural effusion or thickening. Increase in the size of the pleural effusions compared to the CT of 11/07/2019. Other: Fracture of the left clavicular head, acute or possibly subacute new since the prior CT of 11/07/2019. IMPRESSION: 1. No acute intracranial hemorrhage. 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 2.  acute/traumatic cervical spine pathology. 3. Fracture of the left clavicular head, acute or possibly subacute new since the prior CT of 11/07/2019. 4. Partially visualized moderate to large left pleural effusion or hemothorax. Increase in the size of the pleural effusions compared to the CT of  11/07/2019. Electronically Signed   By: Anner Crete M.D.   On: 12/06/2019 20:02   CT Cervical Spine Wo Contrast  Result Date: 12/06/2019 CLINICAL DATA:  74 year old male with head trauma. EXAM: CT HEAD WITHOUT CONTRAST CT CERVICAL SPINE WITHOUT CONTRAST TECHNIQUE: Multidetector CT imaging of the head and cervical spine was performed following the standard protocol without intravenous contrast. Multiplanar CT image reconstructions of the cervical spine were also generated. COMPARISON:  Head CT dated 09/24/2015. FINDINGS: CT HEAD FINDINGS Brain: Moderate age-related atrophy and chronic microvascular ischemic changes. 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 There is no acute intracranial hemorrhage. No mass effect midline shift. No extra-axial fluid collection. Vascular: No hyperdense vessel or unexpected calcification. Skull: Normal. Negative for fracture or focal lesion. Sinuses/Orbits: No acute finding. Other: None CT CERVICAL SPINE FINDINGS Alignment: No acute subluxation. Skull base and vertebrae: No  acute fracture. Osteopenia. Soft tissues and spinal canal: No prevertebral fluid or swelling. No visible canal hematoma. Disc levels:  Multilevel degenerative changes. Upper chest: Emphysema. Partially visualized moderate to large left pleural effusion or hemothorax. Lobulated right pleural effusion or thickening. Increase in the size of the pleural effusions compared to the CT of 11/07/2019. Other: Fracture of the left clavicular head, acute or possibly subacute new since the prior CT of 11/07/2019. IMPRESSION: 1. No acute intracranial hemorrhage. 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 2.  acute/traumatic cervical spine pathology. 3. Fracture of the left clavicular head, acute or possibly subacute new since the prior CT of 11/07/2019. 4. Partially visualized moderate to large left pleural effusion or hemothorax. Increase in the size of the pleural effusions compared  to the CT of 11/07/2019. Electronically Signed   By: Anner Crete M.D.   On: 12/06/2019 20:02   DG Chest Port 1 View  Result Date: 11/17/2019 CLINICAL DATA:  Shortness of breath. EXAM: PORTABLE CHEST 1 VIEW COMPARISON:  Nov 15, 2019 FINDINGS: Right-sided cardiac pacemaker/defibrillator is in stable position. The cardiac silhouette is stably enlarged. Moderately increased interstitial markings. Small bilateral pleural effusions. Bullous emphysematous changes in the upper lobes again noted. Osseous structures are without acute abnormality. Soft tissues are grossly normal. IMPRESSION: 1. Stably enlarged cardiac silhouette. 2. Moderate interstitial pulmonary edema with small bilateral pleural effusions. Electronically Signed   By: Fidela Salisbury M.D.   On: 11/17/2019 09:28   DG CHEST PORT 1 VIEW  Result Date: 11/15/2019 CLINICAL DATA:  Shortness of breath.  Chest pain. EXAM: PORTABLE CHEST 1 VIEW COMPARISON:  11/13/2019.  CT 11/07/2019. FINDINGS: AICD noted stable position. Cardiomegaly. Surgical sutures left lung. Stable changes of bullous COPD. Diffuse bilateral pulmonary infiltrates/edema. Small right pleural effusion. Stable elevation left hemidiaphragm. IMPRESSION: 1.  AICD noted in stable position. 2. Cardiomegaly. Diffuse bilateral pulmonary infiltrates/edema again noted. Small right pleural effusion. 3. Stable changes of bullous COPD. Stable elevation left hemidiaphragm. Electronically Signed   By: Marcello Moores  Register   On: 11/15/2019 11:59   DG Shoulder Left  Result Date: 11/18/2019 CLINICAL DATA:  Left shoulder pain EXAM: LEFT SHOULDER - 2+ VIEW COMPARISON:  None. FINDINGS: Negative for fracture or dislocation. Degenerative change in spurring in the [facility] joint. Irregularity of the greater tuberosity compatible with rotator cuff disease. Narrowing of the acromial humeral distance compatible with rotator cuff disease. Left upper lobe lobectomy clips are present. IMPRESSION: Degenerative change in  the left shoulder with rotator cuff disease. No acute fracture. Electronically Signed   By: Franchot Gallo M.D.   On: 11/18/2019 12:24   ECHOCARDIOGRAM COMPLETE  Result Date: 11/14/2019    ECHOCARDIOGRAM REPORT   Patient Name:   ROMEN YUTZY Date of Exam: 11/14/2019 Medical Rec #:  [id number]     Height:       73.0 in Accession #:    [id number]    Weight:       159.1 lb Date of Birth:  Mar 10, 1946    BSA:          1.952 m Patient Age:    32 years      BP:           119/67 mmHg Patient Gender: M             HR:           67 bpm. Exam Location:  Inpatient Procedure: 2D Echo and Intracardiac Opacification Agent Indications:  CHF-Acute Systolic 824.23 / N36.14  History:        Patient has prior history of Echocardiogram examinations, most                 recent 06/07/2019. Cardiomyopathy, ICD (implantable                 cardioverter-defibrillator), S/P MVR (mitral valve replacement),                 COPD, Stroke and Pulmonary HTN; Risk Factors:Hypertension and                 Dyslipidemia. Elevated troponin.  Sonographer:    Vikki Ports Turrentine Referring Phys: [id number] Truddie Hidden  Sonographer Comments: Technically difficult study due to poor echo windows. IMPRESSIONS  1. LVEF is severely depressed with diffuse hypokinesis, worse in the septal and apical walls Compared to report from Nov 2020 echo, LVEF is worse (apical views foreshortened in 2020 exam which makes assessment difficult). Left ventricular ejection fraction, by estimation, is 25 to 30%. The left ventricle has severely decreased function. The left ventricle demonstrates global hypokinesis. There is mild left ventricular hypertrophy. Left ventricular diastolic parameters are indeterminate.  2. Right ventricular systolic function is moderately reduced. The right ventricular size is severely enlarged. There is moderately elevated pulmonary artery systolic pressure.  3. Left atrial size was severely dilated.  4. Right atrial size was severely dilated.  5.  A 33 mm Medtronic Mosai bioprosthesisiis present Peak and mean gradients through the vave are 15 and 6 mm Hg respectively, both lower than previous echo report in 2020.Marland Kitchen The mitral valve has been repaired/replaced. Trivial mitral valve regurgitation.  6. The aortic valve is abnormal. Aortic valve regurgitation is not visualized. Mild to moderate aortic valve sclerosis/calcification is present, without any evidence of aortic stenosis.  7. Aortic root is 51 mm Previous echo measured echo above this; current exam did not image that high.. Aortic dilatation noted. Aneurysm of the aortic root. There is moderate to severe dilatation of the aortic root measuring 51 mm.  8. The inferior vena cava is dilated in size with <50% respiratory variability, suggesting right atrial pressure of 15 mmHg. FINDINGS  Left Ventricle: LVEF is severely depressed with diffuse hypokinesis, worse in the septal and apical walls Compared to report from Nov 2020 echo, LVEF is worse (apical views foreshortened in 2020 exam which makes assessment difficult). Left ventricular ejection fraction, by estimation, is 25 to 30%. The left ventricle has severely decreased function. The left ventricle demonstrates global hypokinesis. Definity contrast agent was given IV to delineate the left ventricular endocardial borders. The left ventricular internal cavity size was normal in size. There is mild left ventricular hypertrophy. Left ventricular diastolic parameters are indeterminate. Right Ventricle: The right ventricular size is severely enlarged. Right vetricular wall thickness was not assessed. Right ventricular systolic function is moderately reduced. There is moderately elevated pulmonary artery systolic pressure. The tricuspid regurgitant velocity is 3.04 m/s, and with an assumed right atrial pressure of 10 mmHg, the estimated right ventricular systolic pressure is 86.7 mmHg. Left Atrium: Left atrial size was severely dilated. Right Atrium: Right  atrial size was severely dilated. Pericardium: There is no evidence of pericardial effusion. Mitral Valve: A 33 mm Medtronic Mosai bioprosthesisiis present Peak and mean gradients through the vave are 15 and 6 mm Hg respectively, both lower than previous echo report in 2020. The mitral valve has been repaired/replaced. Trivial mitral valve regurgitation. MV peak gradient, 15.1 mmHg. The mean  mitral valve gradient is 6.0 mmHg. Tricuspid Valve: The tricuspid valve is normal in structure. Tricuspid valve regurgitation is trivial. Aortic Valve: The aortic valve is abnormal. Aortic valve regurgitation is not visualized. Mild to moderate aortic valve sclerosis/calcification is present, without any evidence of aortic stenosis. Pulmonic Valve: The pulmonic valve was not well visualized. Pulmonic valve regurgitation is not visualized. Aorta: Aortic root is 51 mm Previous echo measured echo above this; current exam did not image that high. Aortic dilatation noted. There is moderate to severe dilatation of the aortic root measuring 51 mm. There is an aneurysm involving the aortic root. Venous: The inferior vena cava is dilated in size with less than 50% respiratory variability, suggesting right atrial pressure of 15 mmHg. IAS/Shunts: The interatrial septum was not assessed. Additional Comments: A pacer wire is visualized.  LEFT VENTRICLE PLAX 2D LVIDd:         5.10 cm LVIDs:         4.30 cm LV PW:         1.30 cm LV IVS:        1.30 cm LVOT diam:     2.30 cm LV SV:         50 LV SV Index:   26 LVOT Area:     4.15 cm  RIGHT VENTRICLE RV S prime:     4.61 cm/s TAPSE (M-mode): 0.6 cm LEFT ATRIUM            Index        RIGHT ATRIUM           Index LA diam:      6.50 cm  3.33 cm/m   RA Area:     30.00 cm LA Vol (A2C): 195.0 ml 99.88 ml/m  RA Volume:   97.60 ml  49.99 ml/m LA Vol (A4C): 257.0 ml 131.64 ml/m  AORTIC VALVE LVOT Vmax:   77.40 cm/s LVOT Vmean:  56.900 cm/s LVOT VTI:    0.120 m  AORTA Ao Root diam: 5.00 cm MITRAL  VALVE             TRICUSPID VALVE MV Peak grad: 15.1 mmHg  TR Peak grad:   37.0 mmHg MV Mean grad: 6.0 mmHg   TR Vmax:        304.00 cm/s MV Vmax:      1.94 m/s MV Vmean:     108.0 cm/s SHUNTS                          Systemic VTI:  0.12 m                          Systemic Diam: 2.30 cm Dorris Carnes MD Electronically signed by Dorris Carnes MD Signature Date/Time: 11/14/2019/6:50:18 PM    Final    CUP PACEART REMOTE DEVICE CHECK  Result Date: 11/27/2019 Scheduled remote reviewed. Normal device function.  Next remote 91 days. Felisa Bonier, RN, MSN  CT Angio Chest/Abd/Pel for Dissection W and/or Wo Contrast  Result Date: 12/06/2019 CLINICAL DATA:  Chest pain.  Concern for aortic dissection. EXAM: CT ANGIOGRAPHY CHEST, ABDOMEN AND PELVIS TECHNIQUE: Non-contrast CT of the chest was initially obtained. Multidetector CT imaging through the chest, abdomen and pelvis was performed using the standard protocol during bolus administration of intravenous contrast. Multiplanar reconstructed images and MIPs were obtained and reviewed to evaluate the vascular anatomy. CONTRAST:  157mL OMNIPAQUE IOHEXOL 350 MG/ML SOLN  COMPARISON:  11/07/2019 FINDINGS: CTA CHEST FINDINGS Cardiovascular: There is significant cardiac enlargement. The main pulmonary artery is dilated without evidence for an acute pulmonary embolism. There is no evidence for thoracic aortic dissection. Coronary artery calcifications are noted. The arch vessels are grossly patent. 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 This is essentially unchanged from prior study dated 11/07/2019. Mediastinum/Nodes: --there is mild mediastinal adenopathy. This is essentially stable from prior study. --No axillary lymphadenopathy. --No supraclavicular lymphadenopathy. --Normal thyroid gland. --The esophagus is unremarkable Lungs/Pleura: There are moderate severe emphysematous changes bilaterally. There is interlobular  septal thickening. There are moderate-sized bilateral pleural effusions, left greater than right. The right-sided pleural effusion appears to be at least partially loculated. There is bronchial wall thickening and mild mucous plugging at the lung bases bilaterally. There are postsurgical changes in the left upper lobe likely related to prior wedge resection. Musculoskeletal: No chest wall abnormality. No acute or significant osseous findings. Review of the MIP images confirms the above findings. CTA ABDOMEN AND PELVIS FINDINGS VASCULAR Aorta: Normal caliber aorta without aneurysm, dissection, vasculitis or significant stenosis. Celiac: There is a mild loop shaped curvature of the celiac axis with associated narrowing. This can be seen in patients with median arcuate ligament syndrome. SMA: There is variant hepatic arterial anatomy with a replaced common hepatic artery arising from the SMA. Renals: Multiple bilateral renal arteries are noted, all of which appear to be grossly patent. IMA: Patent without evidence of aneurysm, dissection, vasculitis or significant stenosis. Inflow: Patent without evidence of aneurysm, dissection, vasculitis or significant stenosis. Veins: No obvious venous abnormality within the limitations of this arterial phase study. Review of the MIP images confirms the above findings. NON-VASCULAR Hepatobiliary: The liver appears cirrhotic. Normal gallbladder.There is no biliary ductal dilation. Pancreas: Normal contours without ductal dilatation. No peripancreatic fluid collection. Spleen: Unremarkable. Adrenals/Urinary Tract: --Adrenal glands: Unremarkable. --Right kidney/ureter: No hydronephrosis or radiopaque kidney stones. --Left kidney/ureter: No hydronephrosis or radiopaque kidney stones. --Urinary bladder: The urinary bladder is significantly distended. Stomach/Bowel: --Stomach/Duodenum: No hiatal hernia or other gastric abnormality. Normal duodenal course and caliber. --Small bowel:  Unremarkable. --Colon: There is an masslike appearance of the cecum with multiple small mildly enlarged regional lymph nodes. --Appendix: Normal. Lymphatic: --No retroperitoneal lymphadenopathy. --No mesenteric lymphadenopathy. --No pelvic or inguinal lymphadenopathy. Reproductive: Prostate gland is enlarged. Other: There is a mild amount presacral free fluid. There is no free air. There is mild body wall edema. Musculoskeletal. Old compression fractures are noted throughout the lumbar spine with multilevel degenerative changes. These are essentially stable from prior study. Review of the MIP images confirms the above findings. IMPRESSION: 1. No evidence for an acute vascular abnormality. There is no evidence for a thoracic aortic dissection. No evidence for an acute pulmonary embolism. 2. 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 3. Profound cardiomegaly with findings of volume overload including small bilateral pleural effusions and body wall edema. The right-sided pleural effusion appears to be at least partially loculated. 4. Findings are concerning for a cecal mass as detailed above. Follow-up with nonemergent colonoscopy is recommended. 5. Significantly distended urinary bladder. 6. Findings suspicious for underlying cirrhosis. 7. Relatively stable mediastinal and hilar adenopathy of unknown clinical significance. 8. Severe emphysematous changes with findings consistent with infectious or reactive bronchiolitis at the lung bases. Aortic Atherosclerosis (ICD10-I70.0) and Emphysema (ICD10-J43.9). Electronically Signed   By: Constance Holster M.D.   On: 12/06/2019 20:08  VAS [place] LOWER EXTREMITY VENOUS (DVT) (ONLY [facility] & [facility])  Result Date: 11/13/2019  Lower Venous DVTStudy Indications: Edema.  Performing Technologist: June Leap RDMS, RVT  Examination Guidelines: A complete evaluation includes B-mode imaging, spectral Doppler, color Doppler, and power Doppler as  needed of all accessible portions of each vessel. Bilateral testing is considered an integral part of a complete examination. Limited examinations for reoccurring indications may be performed as noted. The reflux portion of the exam is performed with the patient in reverse Trendelenburg.  +---------+---------------+---------+-----------+----------+--------------+ RIGHT    CompressibilityPhasicitySpontaneityPropertiesThrombus Aging +---------+---------------+---------+-----------+----------+--------------+ CFV      Full           Yes      Yes                                 +---------+---------------+---------+-----------+----------+--------------+ SFJ      Full                                                        +---------+---------------+---------+-----------+----------+--------------+ FV Prox  Full                                                        +---------+---------------+---------+-----------+----------+--------------+ FV Mid   Full                                                        +---------+---------------+---------+-----------+----------+--------------+ FV DistalFull                                                        +---------+---------------+---------+-----------+----------+--------------+ PFV      Full                                                        +---------+---------------+---------+-----------+----------+--------------+ POP      Full           Yes      Yes                                 +---------+---------------+---------+-----------+----------+--------------+ PTV      Full                                                        +---------+---------------+---------+-----------+----------+--------------+ PERO     Full                                                        +---------+---------------+---------+-----------+----------+--------------+    +---------+---------------+---------+-----------+----------+--------------+  LEFT     CompressibilityPhasicitySpontaneityPropertiesThrombus Aging +---------+---------------+---------+-----------+----------+--------------+ CFV      Full           Yes      Yes                                 +---------+---------------+---------+-----------+----------+--------------+ SFJ      Full                                                        +---------+---------------+---------+-----------+----------+--------------+ FV Prox  Full                                                        +---------+---------------+---------+-----------+----------+--------------+ FV Mid   Full                                                        +---------+---------------+---------+-----------+----------+--------------+ FV DistalFull                                                        +---------+---------------+---------+-----------+----------+--------------+ PFV      Full                                                        +---------+---------------+---------+-----------+----------+--------------+ POP      Full           Yes      Yes                                 +---------+---------------+---------+-----------+----------+--------------+ PTV      Full                                                        +---------+---------------+---------+-----------+----------+--------------+ PERO     Full                                                        +---------+---------------+---------+-----------+----------+--------------+     Summary: RIGHT: - There is no evidence of deep vein thrombosis in the lower extremity.  - No cystic structure found in the popliteal fossa.  LEFT: - There is no evidence of deep vein thrombosis in the lower extremity.  - No cystic  structure found in the popliteal fossa.  *See table(s) above for measurements and observations. Electronically signed  by Harold Barban MD on 11/13/2019 at 8:39:17 PM.    Final     Labs: BNP (last 3 results) Recent Labs    06/07/19 1007 11/13/19 1550 12/07/19 0029  BNP 1,011.1* 1,448.6* 6,433.2*   Basic Metabolic Panel: Recent Labs  Lab 12/06/19 1824 12/07/19 0029 12/08/19 0415 12/09/19 0401 12/10/19 0617 12/11/19 0639 12/12/19 0717  NA 142   < > 139 137 137 135 138  K 4.9   < > 3.5 4.1 3.9 3.9 3.6  CL 102   < > 99 97* 95* 93* 97*  CO2 29   < > 31 30 30 29  32  GLUCOSE 120*   < > 151* 138* 137* 149* 143*  BUN 22   < > 17 20 21 21 21   CREATININE 1.18   < > 1.02 1.01 1.03 1.20 0.89  CALCIUM 8.6*   < > 8.3* 8.4* 8.3* 8.5* 8.5*  MG 1.9  --  1.8 2.1 2.1  --   --    < > = values in this interval not displayed.   Liver Function Tests: No results for input(s): AST, ALT, ALKPHOS, BILITOT, PROT, ALBUMIN in the last 168 hours. No results for input(s): LIPASE, AMYLASE in the last 168 hours. No results for input(s): AMMONIA in the last 168 hours. CBC: Recent Labs  Lab 12/06/19 1824 12/07/19 0029 12/08/19 0415  WBC 6.8 6.2 7.2  NEUTROABS  --   --  5.6  HGB 10.2* 10.3* 10.2*  HCT 36.6* 37.5* 35.6*  MCV 85.3 84.7 83.6  PLT 295 305 286   Cardiac Enzymes: No results for input(s): CKTOTAL, CKMB, CKMBINDEX, TROPONINI in the last 168 hours. BNP: Invalid input(s): POCBNP CBG: Recent Labs  Lab 12/11/19 1134 12/11/19 1605 12/11/19 2126 12/12/19 0810 12/12/19 1103  GLUCAP 157* 161* 174* 208* 163*   D-Dimer No results for input(s): DDIMER in the last 72 hours. Hgb A1c No results for input(s): HGBA1C in the last 72 hours. Lipid Profile No results for input(s): CHOL, HDL, LDLCALC, TRIG, CHOLHDL, LDLDIRECT in the last 72 hours. Thyroid function studies No results for input(s): TSH, T4TOTAL, T3FREE, THYROIDAB in the last 72 hours.  Invalid input(s): FREET3 Anemia work up No results for input(s): VITAMINB12, FOLATE, FERRITIN, TIBC, IRON, RETICCTPCT in the last 72 hours. Urinalysis     Component Value Date/Time   COLORURINE YELLOW 06/07/2019 1231   APPEARANCEUR CLOUDY (A) 06/07/2019 1231   LABSPEC 1.013 06/07/2019 1231   PHURINE 5.0 06/07/2019 1231   GLUCOSEU 150 (A) 06/07/2019 1231   HGBUR SMALL (A) 06/07/2019 1231   HGBUR negative 04/05/2010 0837   BILIRUBINUR NEGATIVE 06/07/2019 1231   BILIRUBINUR n 03/27/2012 1323   KETONESUR NEGATIVE 06/07/2019 1231   PROTEINUR NEGATIVE 06/07/2019 1231   UROBILINOGEN 1.0 11/28/2013 1000   NITRITE NEGATIVE 06/07/2019 1231   LEUKOCYTESUR LARGE (A) 06/07/2019 1231   Sepsis Labs Invalid input(s): PROCALCITONIN,  WBC,  LACTICIDVEN Microbiology Recent Results (from the past 240 hour(s))  SARS Coronavirus 2 by RT PCR (hospital order, performed in [place] hospital lab) Nasopharyngeal Nasopharyngeal Swab     Status: None   Collection Time: 12/06/19  6:24 PM   Specimen: Nasopharyngeal Swab  Result Value Ref Range Status   SARS Coronavirus 2 NEGATIVE NEGATIVE Final    Comment: (NOTE) SARS-CoV-2 target nucleic acids are NOT DETECTED. The SARS-CoV-2 RNA is generally detectable in upper and lower respiratory specimens during  the acute phase of infection. The lowest concentration of SARS-CoV-2 viral copies this assay can detect is 250 copies / mL. A negative result does not preclude SARS-CoV-2 infection and should not be used as the sole basis for treatment or other patient management decisions.  A negative result may occur with improper specimen collection / handling, submission of specimen other than nasopharyngeal swab, presence of viral mutation(s) within the areas targeted by this assay, and inadequate number of viral copies (<250 copies / mL). A negative result must be combined with clinical observations, patient history, and epidemiological information. Fact Sheet for Patients:   StrictlyIdeas.no Fact Sheet for Healthcare Providers: BankingDealers.co.za This test is not yet  approved or cleared  by the [place] FDA and has been authorized for detection and/or diagnosis of SARS-CoV-2 by FDA under an Emergency Use Authorization (EUA).  This EUA will remain in effect (meaning this test can be used) for the duration of the COVID-19 declaration under Section 564(b)(1) of the Act, 21 U.S.C. section 360bbb-3(b)(1), unless the authorization is terminated or revoked sooner. Performed at [facility], [place] [address] [place], [place] [address]   SARS CORONAVIRUS 2 (TAT 6-24 HRS) Nasopharyngeal Nasopharyngeal Swab     Status: None   Collection Time: 12/10/19  5:54 PM   Specimen: Nasopharyngeal Swab  Result Value Ref Range Status   SARS Coronavirus 2 NEGATIVE NEGATIVE Final    Comment: (NOTE) SARS-CoV-2 target nucleic acids are NOT DETECTED. The SARS-CoV-2 RNA is generally detectable in upper and lower respiratory specimens during the acute phase of infection. Negative results do not preclude SARS-CoV-2 infection, do not rule out co-infections with other pathogens, and should not be used as the sole basis for treatment or other patient management decisions. Negative results must be combined with clinical observations, patient history, and epidemiological information. The expected result is Negative. Fact Sheet for Patients: SugarRoll.be Fact Sheet for Healthcare Providers: https://www.woods-mathews.com/ This test is not yet approved or cleared by the [place] FDA and  has been authorized for detection and/or diagnosis of SARS-CoV-2 by FDA under an Emergency Use Authorization (EUA). This EUA will remain  in effect (meaning this test can be used) for the duration of the COVID-19 declaration under Section 56 4(b)(1) of the Act, 21 U.S.C. section 360bbb-3(b)(1), unless the authorization is terminated or revoked sooner. Performed at [facility], [place] [address], [place], [place] [address]      Time  coordinating discharge: 25 minutes  SIGNED: Antonieta Pert, MD  [facility] 12/12/2019, 3:37 PM  If 7PM-7AM, please contact night-coverage [email or web address]

## 2019-12-11 NOTE — Progress Notes (Signed)
PROGRESS NOTE    [name]  JOI:[id number] DOB: 05/27/1946 DOA: 12/06/2019 PCP: [name], NP   Chef Complaints: chest pain  Brief Narrative:  74 y.o.malewith medical history significant forend-stage chronic combined systolic and diastolic CHF (EF 47-09%) s/pBiVICD, chronic severe COPD/emphysema with chronic respiratory failure on 5 L of home O2 via [place], permanent A. fib on aspirin alone (Hxof ICH while on Coumadin) s/p AV nodal ablation, severe mitral regurgitation s/p bioprosthetic mitral valve replacement, pulmonary hypertension, non-small cell left lung cancer s/p wedge resection, type 2 diabetes, hypertension, and hyperlipidemia who presents to the ED for evaluation of chest pain.  Was recently hospitalized from 11/13/2019-11/25/2019 for acute on chronic CHF ,PT recommended SNF however patient declined and was discharged to home w/ HH on 5l [place] and Lasix 60 mg twice daily.  On 5/28 developed developed significant chest pain while walking.  Seen in the ED initially hypoxic needing 15 L nonrebreather went down to 6 L. EKG shows V paced rhythm. SARS-CoV-2 PCR is negative.  Chest x-ray showed diffuse interstitial infiltrates, small bilateral pleural effusion, cardiomegaly and increased left basilar atelectasis versus consolidation thoracic spine x-ray no acute finding lumbar spine x-ray no acute/traumatic lumbar spine pathology CT Head and C-spine stable. CT chest/abdomen/pelvis dissection study is negative for evidence of acute vascular abnormality, thoracic aortic dissection, or PE.Masslike appearance of the cecum with multiple small mildly enlarged regional lymph nodes noted. Distended urinary bladder and findings of suspected underlying cirrhosis are seen. Severe emphysematous changes with infectious or reactive bronchiolitis at the lung bases also reported Patient seen by cardiology has been diuresed with IV Lasix. Patient had issue with hypotension did not tolerate ACE ARB  beta-blocker was discontinued placed on digoxin.  Troponin peaked at 21 no further ischemic work-up by cardiology. Seen by PT OT and recommended skilled nursing facility.   Subjective: Feeing little better. Leg swelling is little better. On 6l [place]. Leg edematous b/l 1+ Not in distress, voiding on urinal. Lives with wife and looking forward to snf- hs bed at [facility]:  Fall: Suspected presyncope/orthostasis.  Reportedly fell from a standing position after walking a short distance and but no injury.  Patient had extensive imaging with chest x-ray, x-ray thoracic and lumbar spine, CT head and cervical spine no acute fracture.  Orthostasis versus vasovagal, blood pressure soft and Coreg discontinued.  He has been diuresed for his CHF.  To discharge to skilled nursing facility awaiting for [facility].  Acute on chronic respiratory failure with hypoxia due to acute on chronic combined systolic and diastolic CHF/end-stage CHF/status post AICD with bilateral pleural effusion/pulmonary hypertension/chronic COPD: Normally on 5 nasal cannula, on 6 L this morning somewhat dizzy feeling.  Continue diuretics as per cardiology noted plan to switch to oral diuretics today.  Did not tolerate ACE or ARB and Coreg was discontinued due to hypotension.  Seen by palliative care given his poor overall condition.  Permanent A. fib status post AV nodal ablation :  Severe COPD with chronic hypoxic respiratory failure on 5 to nasal cannula continue bronchodilators  Type 2 diabetes mellitus A1c 7.4 recently, sugar is stable holding p.o. meds, continue sliding scale  HLD continue statin  Hypertension beta-blocker discontinued due to hypotension.  Blood pressure stable  History of stage I non-small cell lung cancer left status post wedge resection outpatient follow-up  Masslike appearance of the cecum per CT imaging, admitting physician discussed with the patient could consider further nonemergent  evaluation with colonoscopy  however GIVEN patient's severe cardiac and pulmonary chronic medical problem may not be ideal to pursue as inpatient, if patient interested follow-up outpatient, seen by palliative care.  He will need to see PCP and discuss more.  DVT prophylaxis:SCD Code Status:DNR Family Communication: plan of care discussed with patient at bedside.  Status is: Inpatient Remains inpatient appropriate because:IV treatments appropriate due to intensity of illness or inability to take PO, Inpatient level of care appropriate due to severity of illness and For treatment of his acute on chronic CHF, and further disposition to skilled nursing facility  Dispo: The patient is from: Home              Anticipated d/c is to: SNF              Anticipated d/c date is: 1 day              Patient currently is not medically stable to d/c.  Monitor on oral Lasix and consider discharge tomorrow if SNF available Diet Order            Diet heart healthy/carb modified Room service appropriate? Yes; Fluid consistency: Thin  Diet effective now             Body mass index is 20.15 kg/m. Pressure Ulcer: Pressure Injury 12/08/19 Coccyx Mid;Right;Left Stage 1 -  Intact skin with non-blanchable redness of a localized area usually over a bony prominence. redness or discoloration on coccyx (Active)  12/08/19 0700  Location: Coccyx  Location Orientation: Mid;Right;Left  Staging: Stage 1 -  Intact skin with non-blanchable redness of a localized area usually over a bony prominence.  Wound Description (Comments): redness or discoloration on coccyx  Present on Admission: Yes   Consultants:see note  Procedures:see note Microbiology:see note  Medications: Scheduled Meds: . acetaminophen  500 mg Oral TID  . aspirin EC  81 mg Oral Daily  . atorvastatin  10 mg Oral Daily  . bethanechol  10 mg Oral TID  . citalopram  10 mg Oral Daily  . digoxin  0.125 mg Oral Daily  . furosemide  80 mg Intravenous BID    . insulin aspart  0-9 Units Subcutaneous TID WC  . levalbuterol  0.63 mg Nebulization BID  . mometasone-formoterol  2 puff Inhalation BID  . pantoprazole  40 mg Oral Q0600  . sodium chloride flush  3 mL Intravenous Q12H  . tamsulosin  0.4 mg Oral QPC supper  . umeclidinium bromide  1 puff Inhalation Daily   Continuous Infusions:  Antimicrobials: Anti-infectives (From admission, onward)   None     Objective: Vitals: Today's Vitals   12/11/19 0739 12/11/19 0742 12/11/19 0743 12/11/19 0816  BP:      Pulse:    85  Resp:      Temp:      TempSrc:      SpO2: 95% 94% (!) 84%   Weight:      Height:      PainSc:        Intake/Output Summary (Last 24 hours) at 12/11/2019 0848 Last data filed at 12/11/2019 0618 Gross per 24 hour  Intake 723 ml  Output 740 ml  Net -17 ml   Filed Weights   12/09/19 0403 12/10/19 0625 12/11/19 0531  Weight: 69.8 kg 69.8 kg 69.3 kg   Weight change: -0.544 kg   Intake/Output from previous day: 06/01 0701 - 06/02 0700 In: 963 [P.O.:960; I.V.:3] Out: 740 [Urine:740] Intake/Output this shift: No intake/output data recorded.  Examination:  General exam: AAOx3, weak, frail,NAD, weak appearing. HEENT:Oral mucosa moist, Ear/Nose WNL grossly,dentition normal. Respiratory system: bilaterally diminished,no wheezing or crackles,no use of accessory muscle, non tender. Cardiovascular system: S1 & S2 +, regular, No JVD. Gastrointestinal system: Abdomen soft, NT,ND, BS+. Nervous System:Alert, awake, moving extremities and grossly nonfocal Extremities: b/l ankle edema, distal peripheral pulses palpable.  Skin: No rashes,no icterus. MSK: Normal muscle bulk,tone, power  Data Reviewed: I have personally reviewed following labs and imaging studies CBC: Recent Labs  Lab 12/06/19 1824 12/07/19 0029 12/08/19 0415  WBC 6.8 6.2 7.2  NEUTROABS  --   --  5.6  HGB 10.2* 10.3* 10.2*  HCT 36.6* 37.5* 35.6*  MCV 85.3 84.7 83.6  PLT 295 305 355   Basic  Metabolic Panel: Recent Labs  Lab 12/06/19 1824 12/06/19 1824 12/07/19 0029 12/08/19 0415 12/09/19 0401 12/10/19 0617 12/11/19 0639  NA 142   < > 140 139 137 137 135  K 4.9   < > 3.7 3.5 4.1 3.9 3.9  CL 102   < > 99 99 97* 95* 93*  CO2 29   < > 30 31 30 30 29   GLUCOSE 120*   < > 173* 151* 138* 137* 149*  BUN 22   < > 20 17 20 21 21   CREATININE 1.18   < > 1.10 1.02 1.01 1.03 1.20  CALCIUM 8.6*   < > 8.5* 8.3* 8.4* 8.3* 8.5*  MG 1.9  --   --  1.8 2.1 2.1  --    < > = values in this interval not displayed.   GFR: Estimated Creatinine Clearance: 54.5 mL/min (by C-G formula based on SCr of 1.2 mg/dL). Liver Function Tests: No results for input(s): AST, ALT, ALKPHOS, BILITOT, PROT, ALBUMIN in the last 168 hours. No results for input(s): LIPASE, AMYLASE in the last 168 hours. No results for input(s): AMMONIA in the last 168 hours. Coagulation Profile: No results for input(s): INR, PROTIME in the last 168 hours. Cardiac Enzymes: No results for input(s): CKTOTAL, CKMB, CKMBINDEX, TROPONINI in the last 168 hours. BNP (last 3 results) No results for input(s): PROBNP in the last 8760 hours. HbA1C: No results for input(s): HGBA1C in the last 72 hours. CBG: Recent Labs  Lab 12/10/19 0801 12/10/19 1111 12/10/19 1619 12/10/19 2247 12/11/19 0745  GLUCAP 136* 163* 137* 150* 174*   Lipid Profile: No results for input(s): CHOL, HDL, LDLCALC, TRIG, CHOLHDL, LDLDIRECT in the last 72 hours. Thyroid Function Tests: No results for input(s): TSH, T4TOTAL, FREET4, T3FREE, THYROIDAB in the last 72 hours. Anemia Panel: No results for input(s): VITAMINB12, FOLATE, FERRITIN, TIBC, IRON, RETICCTPCT in the last 72 hours. Sepsis Labs: No results for input(s): PROCALCITON, LATICACIDVEN in the last 168 hours.  Recent Results (from the past 240 hour(s))  SARS Coronavirus 2 by RT PCR (hospital order, performed in [facility] hospital lab) Nasopharyngeal Nasopharyngeal Swab     Status: None    Collection Time: 12/06/19  6:24 PM   Specimen: Nasopharyngeal Swab  Result Value Ref Range Status   SARS Coronavirus 2 NEGATIVE NEGATIVE Final    Comment: (NOTE) SARS-CoV-2 target nucleic acids are NOT DETECTED. The SARS-CoV-2 RNA is generally detectable in upper and lower respiratory specimens during the acute phase of infection. The lowest concentration of SARS-CoV-2 viral copies this assay can detect is 250 copies / mL. A negative result does not preclude SARS-CoV-2 infection and should not be used as the sole basis for treatment or other patient management decisions.  A negative result may occur with improper specimen collection / handling, submission of specimen other than nasopharyngeal swab, presence of viral mutation(s) within the areas targeted by this assay, and inadequate number of viral copies (<250 copies / mL). A negative result must be combined with clinical observations, patient history, and epidemiological information. Fact Sheet for Patients:   StrictlyIdeas.no Fact Sheet for Healthcare Providers: BankingDealers.co.za This test is not yet approved or cleared  by the [place] FDA and has been authorized for detection and/or diagnosis of SARS-CoV-2 by FDA under an Emergency Use Authorization (EUA).  This EUA will remain in effect (meaning this test can be used) for the duration of the COVID-19 declaration under Section 564(b)(1) of the Act, 21 U.S.C. section 360bbb-3(b)(1), unless the authorization is terminated or revoked sooner. Performed at [facility], [place] [address], [place], [place] [address]   SARS CORONAVIRUS 2 (TAT 6-24 HRS) Nasopharyngeal Nasopharyngeal Swab     Status: None   Collection Time: 12/10/19  5:54 PM   Specimen: Nasopharyngeal Swab  Result Value Ref Range Status   SARS Coronavirus 2 NEGATIVE NEGATIVE Final    Comment: (NOTE) SARS-CoV-2 target nucleic acids are NOT DETECTED. The  SARS-CoV-2 RNA is generally detectable in upper and lower respiratory specimens during the acute phase of infection. Negative results do not preclude SARS-CoV-2 infection, do not rule out co-infections with other pathogens, and should not be used as the sole basis for treatment or other patient management decisions. Negative results must be combined with clinical observations, patient history, and epidemiological information. The expected result is Negative. Fact Sheet for Patients: SugarRoll.be Fact Sheet for Healthcare Providers: https://www.woods-mathews.com/ This test is not yet approved or cleared by the [place] FDA and  has been authorized for detection and/or diagnosis of SARS-CoV-2 by FDA under an Emergency Use Authorization (EUA). This EUA will remain  in effect (meaning this test can be used) for the duration of the COVID-19 declaration under Section 56 4(b)(1) of the Act, 21 U.S.C. section 360bbb-3(b)(1), unless the authorization is terminated or revoked sooner. Performed at [facility], [place] [address] [place], [place] [address]       Radiology Studies: No results found.   LOS: 5 days   Antonieta Pert, MD [facility]  12/11/2019, 8:48 AM

## 2019-12-11 NOTE — Care Management Important Message (Signed)
Important Message  Patient Details  Name: [name] MRN: [id number] Date of Birth: 10-Jun-1946   Medicare Important Message Given:  Yes     Shelda Altes 12/11/2019, 4:03 PM

## 2019-12-11 NOTE — Progress Notes (Signed)
Physical Therapy Treatment Patient Details Name: [name] MRN: [id number] DOB: February 25, 1946 Today's Date: 12/11/2019    History of Present Illness [name] is a 74 y.o. male with medical history significant for end-stage chronic combined systolic and diastolic CHF (EF 24-23%) s/p BiV ICD, chronic severe COPD/emphysema with chronic respiratory failure on 5 L of home O2 via [place], permanent A. fib on aspirin alone (Hx of ICH while on Coumadin) s/p AV nodal ablation, severe mitral regurgitation s/p bioprosthetic mitral valve replacement, pulmonary hypertension, non-small cell left lung cancer s/p wedge resection, type 2 diabetes, hypertension presenting to the ED on 12/06/19 due to complaints of chest pain and syncope - admitted for further work-up.     PT Comments    Patient's wife came out to hall asking for assistance after she arrived and found patient standing in bathroom independently. I entered room to assist as nursing was not nearby. Assisted patient from bathroom back to bed. Patient ambulated with min guard, and required assistance for lines/leads. He was able to get himself back into bed in supine position without assist. His O2 saturations dropped with ambulation down to low 80%s. Cued patient to perform pursed lip breathing to improve saturations. He will continue to benefit from skilled PT while here to improve activity tolerance and safety.      Follow Up Recommendations  Home health PT;Supervision for mobility/OOB     Equipment Recommendations       Recommendations for Other Services       Precautions / Restrictions Precautions Precautions: ICD/Pacemaker;Fall;Other (comment) Precaution Comments: watch SPO2 Restrictions Weight Bearing Restrictions: No    Mobility  Bed Mobility Overal bed mobility: Modified Independent Bed Mobility: Sit to Supine           General bed mobility comments: increased time and effort   Transfers Overall transfer level: Modified  independent   Transfers: Sit to/from Stand Sit to Stand: Modified independent (Device/Increase time)         General transfer comment: patient standing in bathroom alone when wife arrived to room. Wife came out to hall asking if someone could help him.  Ambulation/Gait Ambulation/Gait assistance: Min guard Gait Distance (Feet): 25 Feet Assistive device: 1 person hand held assist Gait Pattern/deviations: Step-through pattern Gait velocity: WFL   General Gait Details: no reports of difficulty, benefits from min guard   Stairs             Wheelchair Mobility    Modified Rankin (Stroke Patients Only)       Balance Overall balance assessment: Needs assistance         Standing balance support: Single extremity supported;During functional activity Standing balance-Leahy Scale: Fair                              Cognition Arousal/Alertness: Awake/alert Behavior During Therapy: WFL for tasks assessed/performed Overall Cognitive Status: Within Functional Limits for tasks assessed                                        Exercises      General Comments        Pertinent Vitals/Pain Pain Assessment: No/denies pain    Home Living                      Prior Function  PT Goals (current goals can now be found in the care plan section) Acute Rehab PT Goals Patient Stated Goal: to go home PT Goal Formulation: With patient Time For Goal Achievement: 12/22/19 Potential to Achieve Goals: Good Progress towards PT goals: Progressing toward goals    Frequency    Min 2X/week      PT Plan Discharge plan needs to be updated    Co-evaluation              AM-PAC PT "6 Clicks" Mobility   Outcome Measure  Help needed turning from your back to your side while in a flat bed without using bedrails?: None Help needed moving from lying on your back to sitting on the side of a flat bed without using bedrails?:  None Help needed moving to and from a bed to a chair (including a wheelchair)?: A Little Help needed standing up from a chair using your arms (e.g., wheelchair or bedside chair)?: A Little Help needed to walk in hospital room?: A Little Help needed climbing 3-5 steps with a railing? : A Little 6 Click Score: 20    End of Session Equipment Utilized During Treatment: Oxygen Activity Tolerance: Patient tolerated treatment well Patient left: with call bell/phone within reach;in bed;with bed alarm set;with family/visitor present Nurse Communication: Mobility status PT Visit Diagnosis: Unsteadiness on feet (R26.81);Other abnormalities of gait and mobility (R26.89);Muscle weakness (generalized) (M62.81)     Time: 5916-3846 PT Time Calculation (min) (ACUTE ONLY): 8 min  Charges:  $Gait Training: 8-22 mins                     Tylynn Braniff, PT, GCS 12/11/19,11:33 AM

## 2019-12-11 NOTE — Progress Notes (Addendum)
Progress Note  Patient Name: [name] Date of Encounter: 12/11/2019  Primary Cardiologist: Candee Furbish, MD   Subjective   Patient put out 774mL urine overnight. Still on 6L O2. No chest pain. He felt dizzy this morning while laying in the bed.   Inpatient Medications    Scheduled Meds: . acetaminophen  500 mg Oral TID  . aspirin EC  81 mg Oral Daily  . atorvastatin  10 mg Oral Daily  . bethanechol  10 mg Oral TID  . citalopram  10 mg Oral Daily  . digoxin  0.125 mg Oral Daily  . furosemide  80 mg Intravenous BID  . insulin aspart  0-9 Units Subcutaneous TID WC  . levalbuterol  0.63 mg Nebulization BID  . mometasone-formoterol  2 puff Inhalation BID  . pantoprazole  40 mg Oral Q0600  . sodium chloride flush  3 mL Intravenous Q12H  . tamsulosin  0.4 mg Oral QPC supper  . umeclidinium bromide  1 puff Inhalation Daily   Continuous Infusions:  PRN Meds: acetaminophen **OR** acetaminophen, albuterol, ondansetron **OR** ondansetron (ZOFRAN) IV   Vital Signs    Vitals:   12/10/19 2005 12/10/19 2030 12/11/19 0034 12/11/19 0531  BP: (!) 90/57  (!) 93/55 (!) 101/59  Pulse: 73  74 76  Resp: 17  14 18   Temp: 98.1 F (36.7 C)  98.3 F (36.8 C) 97.9 F (36.6 C)  TempSrc: Oral  Oral Oral  SpO2: (!) 85% 96% 95% 90%  Weight:    69.3 kg  Height:        Intake/Output Summary (Last 24 hours) at 12/11/2019 0737 Last data filed at 12/11/2019 0618 Gross per 24 hour  Intake 963 ml  Output 740 ml  Net 223 ml   Last 3 Weights 12/11/2019 12/10/2019 12/09/2019  Weight (lbs) 152 lb 11.2 oz 153 lb 14.4 oz 153 lb 14.4 oz  Weight (kg) 69.264 kg 69.809 kg 69.809 kg      Telemetry    BiV pacing, 8 beats NSVT - Personally Reviewed  ECG    No new - Personally Reviewed  Physical Exam   GEN: No acute distress.   Neck: No JVD Cardiac: RRR, no murmurs, rubs, or gallops.  Respiratory: crackles LLL GI: Soft, nontender, non-distended  MS: 1+ edema; No deformity. Neuro:  Nonfocal    Psych: Normal affect   Labs    High Sensitivity Troponin:   Recent Labs  Lab 11/15/19 1123 11/15/19 1238 11/15/19 1636 12/06/19 1824 12/06/19 2140  TROPONINIHS 19* 18* 21* 19* 19*      Chemistry Recent Labs  Lab 12/09/19 0401 12/10/19 0617 12/11/19 0639  NA 137 137 135  K 4.1 3.9 3.9  CL 97* 95* 93*  CO2 30 30 29   GLUCOSE 138* 137* 149*  BUN 20 21 21   CREATININE 1.01 1.03 1.20  CALCIUM 8.4* 8.3* 8.5*  GFRNONAA >60 >60 >60  GFRAA >60 >60 >60  ANIONGAP 10 12 13      Hematology Recent Labs  Lab 12/06/19 1824 12/07/19 0029 12/08/19 0415  WBC 6.8 6.2 7.2  RBC 4.29 4.43 4.26  HGB 10.2* 10.3* 10.2*  HCT 36.6* 37.5* 35.6*  MCV 85.3 84.7 83.6  MCH 23.8* 23.3* 23.9*  MCHC 27.9* 27.5* 28.7*  RDW 27.4* 27.3* 26.6*  PLT 295 305 286    BNP Recent Labs  Lab 12/07/19 0029  BNP 1,365.1*     DDimer No results for input(s): DDIMER in the last 168 hours.   Radiology  No results found.  Cardiac Studies   TTE 11/14/2019 1. LVEF is severely depressed with diffuse hypokinesis, worse in the  septal and apical walls Compared to report from Nov 2020 echo, LVEF is  worse (apical views foreshortened in 2020 exam which makes assessment  difficult). Left ventricular ejection  fraction, by estimation, is 25 to 30%. The left ventricle has severely  decreased function. The left ventricle demonstrates global hypokinesis.  There is mild left ventricular hypertrophy. Left ventricular diastolic  parameters are indeterminate.  2. Right ventricular systolic function is moderately reduced. The right  ventricular size is severely enlarged. There is moderately elevated  pulmonary artery systolic pressure.  3. Left atrial size was severely dilated.  4. Right atrial size was severely dilated.  5. A 33 mm Medtronic Mosai bioprosthesisiis present Peak and mean  gradients through the vave are 15 and 6 mm Hg respectively, both lower  than previous echo report in 2020.Marland Kitchen The  mitral valve has been  repaired/replaced. Trivial mitral valve regurgitation.  6. The aortic valve is abnormal. Aortic valve regurgitation is not  visualized. Mild to moderate aortic valve sclerosis/calcification is  present, without any evidence of aortic stenosis.  7. Aortic root is 51 mm Previous echo measured echo above this; current  exam did not image that high.. Aortic dilatation noted. Aneurysm of the  aortic root. There is moderate to severe dilatation of the aortic root  measuring 51 mm.  8. The inferior vena cava is dilated in size with <50% respiratory  variability, suggesting right atrial pressure of 15 mmHg.   Patient Profile     74 y.o. male with CHF (25% with end stage CM), Afib s/p AVN ablation with BiV ICD, severe MR s/p MVR, LAA clipping, severe COPD, frailty admitted 12/06/2019 after a fall that was likely orthostatic per report  Assessment & Plan   Fall - Sounds orthostatic in nature. He fell after leaving the bathroom. He felt dzzy with blurred vision but no syncope. Suspect fall due to hypotension from medications - Troponin negative - EKG with BiV paced rhythm - CTA without dissection - Orthostatics negative - PT/OT - Palliative care. Plan at d/c SNF for rehab  Systolic heart failure, EF 25%/End stage CM/severe MR s/p MVR - On admission CT with pulmonary edema. Due to borderline pressure diuretics initially held. When seen by cardiology noted to be volume up and he was started on IV lasix BID. BNP came back at 1,365 - It was well documented that patient does not tolerate multiple medications due to hypotension and he is not a candidate for advanced therapy due to multiple comorbidities - Has not tolerate Ace/ARB in the past - Coreg caused hypotension so this was stopped and Digoxin was added.  - Digoxin level <0.2 - He is still on IV lasix 80mg  BID. He put out 744mL urine overnight and is net -2.6L since admission - Weight down 3lbs - kidney function  stable - Volume status slowly improving. Can switch to oral lasix  Atypical Chest pain - Hs troponin peaked at 21 - No further cardiac work-up  Permanent Afib s/p SVN ablation with BiV PPM - s/p LAA clipping - Not on a/c due to history of intracerebral hemorrhage - Aspirin 81mg  daily  COPD on 5L O2 at home - per IM  HTN - BP this AM 101/59  HLD - atorvastatin  For questions or updates, please contact [facility] Please consult [email or web address] for contact info under  Signed, Cadence Ninfa Meeker, PA-C  12/11/2019, 7:37 AM    History and all data above reviewed.  Patient examined.  I agree with the findings as above.  The patient exam reveals COR:RRR  ,  Lungs: Decreased breath sounds  ,  Abd: Positive bowel sounds, no rebound no guarding, Ext No edema  .  All available labs, radiology testing, previous records reviewed. Agree with documented assessment and plan. Acute on chronic systolic HF:  Plan to change to PO Lasix today.    Jeneen Rinks [facility]  9:47 AM  12/11/2019

## 2019-12-12 DIAGNOSIS — I5023 Acute on chronic systolic (congestive) heart failure: Secondary | ICD-10-CM

## 2019-12-12 LAB — BASIC METABOLIC PANEL
Anion gap: 9 (ref 5–15)
BUN: 21 mg/dL (ref 8–23)
CO2: 32 mmol/L (ref 22–32)
Calcium: 8.5 mg/dL — ABNORMAL LOW (ref 8.9–10.3)
Chloride: 97 mmol/L — ABNORMAL LOW (ref 98–111)
Creatinine, Ser: 0.89 mg/dL (ref 0.61–1.24)
GFR calc Af Amer: >60 mL/min (ref >60)
GFR calc non Af Amer: >60 mL/min (ref >60)
Glucose, Bld: 143 mg/dL — ABNORMAL HIGH (ref 70–99)
Potassium: 3.6 mmol/L (ref 3.5–5.1)
Sodium: 138 mmol/L (ref 135–145)

## 2019-12-12 LAB — GLUCOSE, CAPILLARY
Glucose-Capillary: 208 mg/dL — ABNORMAL HIGH (ref 70–99)
Glucose-Capillary: 163 mg/dL — ABNORMAL HIGH (ref 70–99)
Glucose-Capillary: 183 mg/dL — ABNORMAL HIGH (ref 70–99)

## 2019-12-12 MED ORDER — FUROSEMIDE 80 MG PO TABS: 80.0000 mg | ORAL_TABLET | Freq: Two times a day (BID) | ORAL | Status: AC

## 2019-12-12 MED ORDER — CITALOPRAM HYDROBROMIDE 10 MG PO TABS: 10.0000 mg | ORAL_TABLET | Freq: Every day | ORAL | Status: AC

## 2019-12-12 MED ORDER — PANTOPRAZOLE SODIUM 40 MG PO TBEC: 40.0000 mg | DELAYED_RELEASE_TABLET | Freq: Every day | ORAL | Status: AC

## 2019-12-12 MED ORDER — LEVALBUTEROL HCL 0.63 MG/3ML IN NEBU: 0.6300 mg | INHALATION_SOLUTION | Freq: Two times a day (BID) | RESPIRATORY_TRACT | 12 refills | Status: AC

## 2019-12-12 MED ORDER — DIGOXIN 125 MCG PO TABS: 0.1250 mg | ORAL_TABLET | Freq: Every day | ORAL | Status: AC

## 2019-12-12 NOTE — Progress Notes (Addendum)
Progress Note  Patient Name: [name] Date of Encounter: 12/12/2019  Primary Cardiologist: Candee Furbish, MD   Subjective   No urine output recorded overnight however weight is down another 1lb.  Denies chest pain. Remains on 6L O2.   Inpatient Medications    Scheduled Meds: . acetaminophen  500 mg Oral TID  . aspirin EC  81 mg Oral Daily  . atorvastatin  10 mg Oral Daily  . bethanechol  10 mg Oral TID  . citalopram  10 mg Oral Daily  . digoxin  0.125 mg Oral Daily  . furosemide  80 mg Oral BID  . insulin aspart  0-9 Units Subcutaneous TID WC  . levalbuterol  0.63 mg Nebulization BID  . mometasone-formoterol  2 puff Inhalation BID  . pantoprazole  40 mg Oral Q0600  . sodium chloride flush  3 mL Intravenous Q12H  . tamsulosin  0.4 mg Oral QPC supper  . umeclidinium bromide  1 puff Inhalation Daily   Continuous Infusions:  PRN Meds: acetaminophen **OR** acetaminophen, albuterol, ondansetron **OR** ondansetron (ZOFRAN) IV   Vital Signs    Vitals:   12/11/19 2055 12/11/19 2056 12/12/19 0048 12/12/19 0605  BP:   100/60 (!) 92/56  Pulse:   77 73  Resp:   15 15  Temp:   97.9 F (36.6 C) 98 F (36.7 C)  TempSrc:   Oral Oral  SpO2: 95% 96% (!) 89%   Weight:    68.5 kg  Height:        Intake/Output Summary (Last 24 hours) at 12/12/2019 0635 Last data filed at 12/11/2019 2112 Gross per 24 hour  Intake 945 ml  Output --  Net 945 ml   Last 3 Weights 12/12/2019 12/11/2019 12/10/2019  Weight (lbs) 151 lb 1.6 oz 152 lb 11.2 oz 153 lb 14.4 oz  Weight (kg) 68.539 kg 69.264 kg 69.809 kg      Telemetry    BIV pacing, PVCs, HR 70s - Personally Reviewed  ECG    No new - Personally Reviewed  Physical Exam   GEN: No acute distress.   Neck: mild JVD Cardiac: RRR, no murmurs, rubs, or gallops.  Respiratory: Rhonchii GI: Soft, nontender, non-distended  MS: 1+ edema; No deformity. Neuro:  Nonfocal  Psych: Normal affect   Labs    High Sensitivity Troponin:   Recent  Labs  Lab 11/15/19 1123 11/15/19 1238 11/15/19 1636 12/06/19 1824 12/06/19 2140  TROPONINIHS 19* 18* 21* 19* 19*      Chemistry Recent Labs  Lab 12/09/19 0401 12/10/19 0617 12/11/19 0639  NA 137 137 135  K 4.1 3.9 3.9  CL 97* 95* 93*  CO2 30 30 29   GLUCOSE 138* 137* 149*  BUN 20 21 21   CREATININE 1.01 1.03 1.20  CALCIUM 8.4* 8.3* 8.5*  GFRNONAA >60 >60 >60  GFRAA >60 >60 >60  ANIONGAP 10 12 13      Hematology Recent Labs  Lab 12/06/19 1824 12/07/19 0029 12/08/19 0415  WBC 6.8 6.2 7.2  RBC 4.29 4.43 4.26  HGB 10.2* 10.3* 10.2*  HCT 36.6* 37.5* 35.6*  MCV 85.3 84.7 83.6  MCH 23.8* 23.3* 23.9*  MCHC 27.9* 27.5* 28.7*  RDW 27.4* 27.3* 26.6*  PLT 295 305 286    BNP Recent Labs  Lab 12/07/19 0029  BNP 1,365.1*     DDimer No results for input(s): DDIMER in the last 168 hours.   Radiology    No results found.  Cardiac Studies   TTE  11/14/2019 1. LVEF is severely depressed with diffuse hypokinesis, worse in the  septal and apical walls Compared to report from Nov 2020 echo, LVEF is  worse (apical views foreshortened in 2020 exam which makes assessment  difficult). Left ventricular ejection  fraction, by estimation, is 25 to 30%. The left ventricle has severely  decreased function. The left ventricle demonstrates global hypokinesis.  There is mild left ventricular hypertrophy. Left ventricular diastolic  parameters are indeterminate.  2. Right ventricular systolic function is moderately reduced. The right  ventricular size is severely enlarged. There is moderately elevated  pulmonary artery systolic pressure.  3. Left atrial size was severely dilated.  4. Right atrial size was severely dilated.  5. A 33 mm Medtronic Mosai bioprosthesisiis present Peak and mean  gradients through the vave are 15 and 6 mm Hg respectively, both lower  than previous echo report in 2020.Marland Kitchen The mitral valve has been  repaired/replaced. Trivial mitral valve regurgitation.   6. The aortic valve is abnormal. Aortic valve regurgitation is not  visualized. Mild to moderate aortic valve sclerosis/calcification is  present, without any evidence of aortic stenosis.  7. Aortic root is 51 mm Previous echo measured echo above this; current  exam did not image that high.. Aortic dilatation noted. Aneurysm of the  aortic root. There is moderate to severe dilatation of the aortic root  measuring 51 mm.  8. The inferior vena cava is dilated in size with <50% respiratory  variability, suggesting right atrial pressure of 15 mmHg.   Patient Profile     74 y.o. male with CHF (25% with end stage CM), Afib s/p AVN ablation with BiV ICD, severe MR s/p MVR, LAA clipping, severe COPD, frailty admitted 12/06/2019 after a fall that was likely orthostatic per report.   Assessment & Plan    Fall - Sounds orthostatic in nature. Hefellafter leaving the bathroom. He felt dzzy with blurred vision but no syncope.Suspect fall due to hypotensionfrom medications - Troponin negative - EKG with BiV pacedrhythm - CTA without dissection - Orthostaticsnegative - PT/OT - Palliative care. Plan at d/c SNF for rehab  Systolic heart failure, EF 25%/End stage CM/severe MR s/p MVR - On admission CT with pulmonary edema. Due to borderline pressure diuretics initially held. When seen by cardiology noted to be volume up and he was started on IV lasix BID. BNP came back at [place] documented that patient does not tolerate multiple medications due to hypotension and he is not a candidate for advanced therapy due to multiple comorbidities - Has not tolerate Ace/ARB in the past - Coreg caused hypotension so this was stopped and Digoxin was added.  - Digoxin level <0.2 - He is still IV lasix was transitioned to oral lasix. I/Os do not appear accurate. Weights are down 4 lbs.  - AM labs pending - continue with oral diuretics  Atypical Chest pain - Hs troponin peaked at 21 - No  further cardiac work-up  Permanent Afib s/p SVN ablation with BiV PPM - s/p LAA clipping - Not on a/c due to history of intracerebral hemorrhage - Aspirin 81mg  daily  COPD on 5L O2 at home - per IM  HTN - BP this AM 92/56  HLD - atorvastatin   For questions or updates, please contact [facility] Please consult [email or web address] for contact info under        Signed, Cadence Ninfa Meeker, PA-C  12/12/2019, 6:35 AM    History and all data above reviewed.  Patient examined.  I agree with the findings as above. No chest pain.  Breathing is still short but better than on admission.    The patient exam reveals [id number]  ,  Lungs: Decreased breath sounds at the bases  ,  Abd: Positive bowel sounds, no rebound no guarding, Ext  Trace ankle edema  .  All available labs, radiology testing, previous records reviewed. Agree with documented assessment and plan. Acute on chronic systolic HF.  Agree with current therapy.  We will see as needed.   Jeneen Rinks [facility]  11:04 AM  12/12/2019

## 2019-12-12 NOTE — TOC Transition Note (Addendum)
Transition of Care [facility]) - CM/SW Discharge Note   Patient Details  Name: [name] MRN: [id number] Date of Birth: 01/13/1946  [facility] Care [facility]) CM/SW Contact:  Trula Ore, [place] Phone Number: 12/12/2019, 4:03 PM   Clinical Narrative:     Patient will DC to: [facility] date: 12/12/2019  Family notified: [name]  Transport by: Corey Harold  ?  Per MD patient ready for DC to [facility]. RN, patient, patient's Lance Muss with [place], and facility notified of DC. Discharge Summary sent to facility. RN given number for report tele# [phone] RM#[phone]. DC packet on chart. Ambulance transport requested for patient.  CSW signing off. Final next level of care: Skilled Nursing Facility Barriers to Discharge: No Barriers Identified   Patient Goals and CMS Choice Patient states their goals for this hospitalization and ongoing recovery are:: SNF CMS Medicare.gov Compare Post Acute Care list provided to:: Patient Choice offered to / list presented to : Patient  Discharge Placement              Patient chooses bed at: [facility] Patient to be transferred to facility by: [facility] Name of family member notified: [name] [phone] Patient and family notified of of transfer: 12/12/19  Discharge Plan and Services                                     Social Determinants of Health (SDOH) Interventions     Readmission Risk Interventions Readmission Risk Prevention Plan 11/25/2019 11/19/2019  Transportation Screening Complete Complete  PCP or Specialist Appt within 3-5 Days Complete Complete  HRI or Home Care Consult Complete Complete  Social Work Consult for [place] Planning/Counseling Complete Complete  Palliative Care Screening Complete Complete  Medication Review Press photographer) Complete Complete  Some recent data might be hidden

## 2019-12-12 NOTE — TOC Progression Note (Signed)
Transition of Care [facility]) - Progression Note    Patient Details  Name: ARZELL MCGEEHAN MRN: [id number] Date of Birth: 23-Sep-1945  Transition of Care [facility]) CM/SW [place], [place] Phone Number: 12/12/2019, 3:01 PM  Clinical Narrative:     CSW received PASSR number approved. Patient has bed at [facility] rehab when medically ready for discharge.  Patient has bed at [facility]. PASSR number is approved.  Expected Discharge Plan: [place] Barriers to Discharge: Continued Medical Work up  Expected Discharge Plan and Services Expected Discharge Plan: [place] arrangements for the past 2 months: Single Family Home                                       Social Determinants of Health (SDOH) Interventions    Readmission Risk Interventions Readmission Risk Prevention Plan 11/25/2019 11/19/2019  Transportation Screening Complete Complete  PCP or Specialist Appt within 3-5 Days Complete Complete  HRI or [place] Complete Complete  Social Work Consult for [place] Planning/Counseling Complete Complete  Palliative Care Screening Complete Complete  Medication Review Press photographer) Complete Complete  Some recent data might be hidden

## 2019-12-12 NOTE — Plan of Care (Signed)
  Problem: Education: Goal: Understanding of cardiac disease, CV risk reduction, and recovery process will improve Outcome: Progressing   Problem: Activity: Goal: Ability to tolerate increased activity will improve Outcome: Progressing   Problem: Cardiac: Goal: Ability to achieve and maintain adequate cardiovascular perfusion will improve Outcome: Progressing   Problem: Safety: Goal: Ability to remain free from injury will improve Outcome: Adequate for Discharge

## 2019-12-12 NOTE — Progress Notes (Signed)
Report given to receiving RN at [facility]. All personal belongings and discharge instructions sent with pt to facility.   Lenna Sciara, RN, BSN

## 2019-12-12 NOTE — Progress Notes (Signed)
PROGRESS NOTE    [name]  QQI:[id number] DOB: 12-07-1945 DOA: 12/06/2019 PCP: [name], NP   Chef Complaints: chest pain  Brief Narrative:  74 y.o.malewith medical history significant forend-stage chronic combined systolic and diastolic CHF (EF 94-17%) s/pBiVICD, chronic severe COPD/emphysema with chronic respiratory failure on 5 L of home O2 via [place], permanent A. fib on aspirin alone (Hxof ICH while on Coumadin) s/p AV nodal ablation, severe mitral regurgitation s/p bioprosthetic mitral valve replacement, pulmonary hypertension, non-small cell left lung cancer s/p wedge resection, type 2 diabetes, hypertension, and hyperlipidemia who presents to the ED for evaluation of chest pain.  Was recently hospitalized from 11/13/2019-11/25/2019 for acute on chronic CHF ,PT recommended SNF however patient declined and was discharged to home w/ HH on 5l [place] and Lasix 60 mg twice daily.  On 5/28 developed developed significant chest pain while walking.  Seen in the ED initially hypoxic needing 15 L nonrebreather went down to 6 L. EKG shows V paced rhythm. SARS-CoV-2 PCR is negative.  Chest x-ray showed diffuse interstitial infiltrates, small bilateral pleural effusion, cardiomegaly and increased left basilar atelectasis versus consolidation thoracic spine x-ray no acute finding lumbar spine x-ray no acute/traumatic lumbar spine pathology CT Head and C-spine stable. CT chest/abdomen/pelvis dissection study is negative for evidence of acute vascular abnormality, thoracic aortic dissection, or PE.Masslike appearance of the cecum with multiple small mildly enlarged regional lymph nodes noted. Distended urinary bladder and findings of suspected underlying cirrhosis are seen. Severe emphysematous changes with infectious or reactive bronchiolitis at the lung bases also reported Patient seen by cardiology has been diuresed with IV Lasix. Patient had issue with hypotension did not tolerate ACE ARB  beta-blocker was discontinued placed on digoxin.  Troponin peaked at 21 no further ischemic work-up by cardiology. Seen by PT OT and recommended skilled nursing facility.   Subjective: Seen this morning alert awake not in acute distress, 1 pound weight loss.  On 5l nasal cannula. No dizziness.  No new complaints.  Assessment & Plan:  Fall/suspected presyncope/orthostasis: Reportedly fell from a standing position after walking a short distance and but no injury.  Patient had extensive imaging with chest x-ray, x-ray thoracic and lumbar spine, CT head and cervical spine no acute fracture.  Orthostasis versus vasovagal, blood pressure soft and Coreg discontinued.  He has been diuresed for his CHF.  Now on oral Lasix and tolerating.  He is awaiting for placement.   Acute on chronic hypoxic respiratory failure/acute on chronic combined systolic and diastolic CHF in the setting of end-stage CHF/AICD in place/pulmonary hypertension/chronic COPD: Normally on 5 nasal cannula, currently at home oxygen setting, aggressively diuresed now on oral Lasix, continue the same.  Adding beta-blocker due to hypotension.  Overall prognosis guarded to poor.  Permanent atrial fibrillation status post AV nodal ablation unable to-tolerate beta-blocker.  Severe COPD with chronic hypoxic respiratory failure on 5 to nasal cannula continue bronchodilators  Type 2 diabetes mellitus A1c 7.4 recently, sugar is stable holding p.o. meds, continue sliding scale  HLD continue statin  Hypertension beta-blocker discontinued due to hypotension.  Blood pressure stable  History of stage I non-small cell lung cancer left status post wedge resection outpatient follow-up  Masslike appearance of the cecum per CT imaging, admitting physician discussed with the patient could consider further nonemergent evaluation with colonoscopy however GIVEN patient's severe cardiac and pulmonary chronic medical problem may not be ideal to pursue as  inpatient, if patient interested follow-up outpatient, seen by palliative care.  He will  need to see PCP and discuss more.  DVT prophylaxis:SCD Code Status:DNR Family Communication: plan of care discussed with patient at bedside.  Status is: Inpatient Remains inpatient appropriate because:IV treatments appropriate due to intensity of illness or inability to take PO, Inpatient level of care appropriate due to severity of illness and For treatment of his acute on chronic CHF, and further disposition to skilled nursing facility  Dispo: The patient is from: Home              Anticipated d/c is to: SNF              Anticipated d/c date is: Once SNF approved              Patient currently medically stable for discharge. Diet Order            Diet heart healthy/carb modified Room service appropriate? Yes; Fluid consistency: Thin  Diet effective now             Body mass index is 19.94 kg/m. Pressure Ulcer: Pressure Injury 12/08/19 Coccyx Mid;Right;Left Stage 1 -  Intact skin with non-blanchable redness of a localized area usually over a bony prominence. redness or discoloration on coccyx (Active)  12/08/19 0700  Location: Coccyx  Location Orientation: Mid;Right;Left  Staging: Stage 1 -  Intact skin with non-blanchable redness of a localized area usually over a bony prominence.  Wound Description (Comments): redness or discoloration on coccyx  Present on Admission: Yes   Consultants:see note  Procedures:see note Microbiology:see note  Medications: Scheduled Meds: . acetaminophen  500 mg Oral TID  . aspirin EC  81 mg Oral Daily  . atorvastatin  10 mg Oral Daily  . bethanechol  10 mg Oral TID  . citalopram  10 mg Oral Daily  . digoxin  0.125 mg Oral Daily  . furosemide  80 mg Oral BID  . insulin aspart  0-9 Units Subcutaneous TID WC  . levalbuterol  0.63 mg Nebulization BID  . mometasone-formoterol  2 puff Inhalation BID  . pantoprazole  40 mg Oral Q0600  . sodium chloride flush   3 mL Intravenous Q12H  . tamsulosin  0.4 mg Oral QPC supper  . umeclidinium bromide  1 puff Inhalation Daily   Continuous Infusions:  Antimicrobials: Anti-infectives (From admission, onward)   None     Objective: Vitals: Today's Vitals   12/12/19 0841 12/12/19 0913 12/12/19 1122 12/12/19 1250  BP:  104/63  (!) 96/55  Pulse: 73 72 74 75  Resp: 19 19  18   Temp:  97.7 F (36.5 C)    TempSrc:  Oral    SpO2: 93% 95%  90%  Weight:      Height:      PainSc:        Intake/Output Summary (Last 24 hours) at 12/12/2019 1348 Last data filed at 12/11/2019 2112 Gross per 24 hour  Intake 363 ml  Output --  Net 363 ml   Filed Weights   12/10/19 0625 12/11/19 0531 12/12/19 0605  Weight: 69.8 kg 69.3 kg 68.5 kg   Weight change: -0.726 kg   Intake/Output from previous day: 06/02 0701 - 06/03 0700 In: 945 [P.O.:942; I.V.:3] Out: -  Intake/Output this shift: No intake/output data recorded.  Examination:  General exam: AAO, hard of hearing, ill looking, frail HEENT:Oral mucosa moist, Ear/Nose WNL grossly, dentition normal. Respiratory system: bilaterally clear,no wheezing or crackles,no use of accessory muscle Cardiovascular system: S1 & S2 +, No JVD,. Gastrointestinal system: Abdomen soft,  NT,ND, BS+ Nervous System:Alert, awake, moving extremities and grossly nonfocal Extremities: Bilateral ankle edema present 1+, much improved from before as per patient.  Distal peripheral pulses palpable.  Skin: No rashes,no icterus. MSK: Normal muscle bulk,tone, power  Data Reviewed: I have personally reviewed following labs and imaging studies CBC: Recent Labs  Lab 12/06/19 1824 12/07/19 0029 12/08/19 0415  WBC 6.8 6.2 7.2  NEUTROABS  --   --  5.6  HGB 10.2* 10.3* 10.2*  HCT 36.6* 37.5* 35.6*  MCV 85.3 84.7 83.6  PLT 295 305 277   Basic Metabolic Panel: Recent Labs  Lab 12/06/19 1824 12/07/19 0029 12/08/19 0415 12/09/19 0401 12/10/19 0617 12/11/19 0639 12/12/19 0717  NA  142   < > 139 137 137 135 138  K 4.9   < > 3.5 4.1 3.9 3.9 3.6  CL 102   < > 99 97* 95* 93* 97*  CO2 29   < > 31 30 30 29  32  GLUCOSE 120*   < > 151* 138* 137* 149* 143*  BUN 22   < > 17 20 21 21 21   CREATININE 1.18   < > 1.02 1.01 1.03 1.20 0.89  CALCIUM 8.6*   < > 8.3* 8.4* 8.3* 8.5* 8.5*  MG 1.9  --  1.8 2.1 2.1  --   --    < > = values in this interval not displayed.   GFR: Estimated Creatinine Clearance: 72.7 mL/min (by C-G formula based on SCr of 0.89 mg/dL). Liver Function Tests: No results for input(s): AST, ALT, ALKPHOS, BILITOT, PROT, ALBUMIN in the last 168 hours. No results for input(s): LIPASE, AMYLASE in the last 168 hours. No results for input(s): AMMONIA in the last 168 hours. Coagulation Profile: No results for input(s): INR, PROTIME in the last 168 hours. Cardiac Enzymes: No results for input(s): CKTOTAL, CKMB, CKMBINDEX, TROPONINI in the last 168 hours. BNP (last 3 results) No results for input(s): PROBNP in the last 8760 hours. HbA1C: No results for input(s): HGBA1C in the last 72 hours. CBG: Recent Labs  Lab 12/11/19 1134 12/11/19 1605 12/11/19 2126 12/12/19 0810 12/12/19 1103  GLUCAP 157* 161* 174* 208* 163*   Lipid Profile: No results for input(s): CHOL, HDL, LDLCALC, TRIG, CHOLHDL, LDLDIRECT in the last 72 hours. Thyroid Function Tests: No results for input(s): TSH, T4TOTAL, FREET4, T3FREE, THYROIDAB in the last 72 hours. Anemia Panel: No results for input(s): VITAMINB12, FOLATE, FERRITIN, TIBC, IRON, RETICCTPCT in the last 72 hours. Sepsis Labs: No results for input(s): PROCALCITON, LATICACIDVEN in the last 168 hours.  Recent Results (from the past 240 hour(s))  SARS Coronavirus 2 by RT PCR (hospital order, performed in [facility] hospital lab) Nasopharyngeal Nasopharyngeal Swab     Status: None   Collection Time: 12/06/19  6:24 PM   Specimen: Nasopharyngeal Swab  Result Value Ref Range Status   SARS Coronavirus 2 NEGATIVE NEGATIVE Final     Comment: (NOTE) SARS-CoV-2 target nucleic acids are NOT DETECTED. The SARS-CoV-2 RNA is generally detectable in upper and lower respiratory specimens during the acute phase of infection. The lowest concentration of SARS-CoV-2 viral copies this assay can detect is 250 copies / mL. A negative result does not preclude SARS-CoV-2 infection and should not be used as the sole basis for treatment or other patient management decisions.  A negative result may occur with improper specimen collection / handling, submission of specimen other than nasopharyngeal swab, presence of viral mutation(s) within the areas targeted by this assay, and inadequate  number of viral copies (<250 copies / mL). A negative result must be combined with clinical observations, patient history, and epidemiological information. Fact Sheet for Patients:   StrictlyIdeas.no Fact Sheet for Healthcare Providers: BankingDealers.co.za This test is not yet approved or cleared  by the [place] FDA and has been authorized for detection and/or diagnosis of SARS-CoV-2 by FDA under an Emergency Use Authorization (EUA).  This EUA will remain in effect (meaning this test can be used) for the duration of the COVID-19 declaration under Section 564(b)(1) of the Act, 21 U.S.C. section 360bbb-3(b)(1), unless the authorization is terminated or revoked sooner. Performed at [facility], [place] [address] [place], [place] [address]   SARS CORONAVIRUS 2 (TAT 6-24 HRS) Nasopharyngeal Nasopharyngeal Swab     Status: None   Collection Time: 12/10/19  5:54 PM   Specimen: Nasopharyngeal Swab  Result Value Ref Range Status   SARS Coronavirus 2 NEGATIVE NEGATIVE Final    Comment: (NOTE) SARS-CoV-2 target nucleic acids are NOT DETECTED. The SARS-CoV-2 RNA is generally detectable in upper and lower respiratory specimens during the acute phase of infection. Negative results do not preclude  SARS-CoV-2 infection, do not rule out co-infections with other pathogens, and should not be used as the sole basis for treatment or other patient management decisions. Negative results must be combined with clinical observations, patient history, and epidemiological information. The expected result is Negative. Fact Sheet for Patients: SugarRoll.be Fact Sheet for Healthcare Providers: https://www.woods-mathews.com/ This test is not yet approved or cleared by the [place] FDA and  has been authorized for detection and/or diagnosis of SARS-CoV-2 by FDA under an Emergency Use Authorization (EUA). This EUA will remain  in effect (meaning this test can be used) for the duration of the COVID-19 declaration under Section 56 4(b)(1) of the Act, 21 U.S.C. section 360bbb-3(b)(1), unless the authorization is terminated or revoked sooner. Performed at [facility], [place] [address] [place], [place] [address]       Radiology Studies: No results found.   LOS: 6 days   Antonieta Pert, MD [facility]  12/12/2019, 1:48 PM

## 2019-12-13 ENCOUNTER — Other Ambulatory Visit: Payer: Self-pay

## 2019-12-13 ENCOUNTER — Non-Acute Institutional Stay: Payer: Medicare Other

## 2019-12-13 DIAGNOSIS — Z515 Encounter for palliative care: Secondary | ICD-10-CM

## 2019-12-13 NOTE — Consult Note (Signed)
   [facility] CM Inpatient Consult   12/13/2019  [name] October 13, 1945 [id number]  .  Patient will be followed by [facility] PAC RN with [employer].  Patient was identified to have [name]SNF] as the facility of transfer.   Thank you.  Natividad Brood, RN BSN [facility] Liaison  [phone] business mobile phone Toll free office [phone]  Fax number: [phone] [email or web address]

## 2019-12-15 NOTE — Progress Notes (Signed)
COMMUNITY PALLIATIVE CARE SW NOTE  PATIENT NAME: [name] DOB: 01/26/1946 MRN: [id number]  PRIMARY CARE PROVIDER: Gayland Curry, DO  RESPONSIBLE PARTY:  Acct ID - Guarantor Home Phone Work Phone Relationship Acct Type  [id number] - [name],[name]  Self P/F     [address], [place], [place] [address]     PLAN OF CARE and INTERVENTIONS:             1. GOALS OF CARE/ ADVANCE CARE PLANNING:  Goal is for patient to receive therapy and return home. Advance directives to be clarified with wife. 2. SOCIAL/EMOTIONAL/SPIRITUAL ASSESSMENT/ INTERVENTIONS: SW completed a face-to-face visit with patient. SW provided introduction to Education officer, museum and palliative care team, role in care and visit frequency.   He was in bed, awake and alert x3. He denied pain. Patient is currently quarantined due to recent hospitalization. He is receiving PT/OT daily and he feels that it is going well. He report that he is eating well and he generally has no pain. He verbalized that his goal is to walk without falling and to return home. He was able to provide social history on himself. Patient has been married for over 30 years. He has a daughter. He retired from the post [employer]. He reports that he loves watching TV. He is a DNR. His wife serve as his POA/HCPOA. SW consulted with his facility nurse-[facility]. She reported that patient is eating well and he is participating in PT/OT and progressing adequately. SW left a message for his wife updating her on the visit and extending support to her.  3. PATIENT/CAREGIVER EDUCATION/ COPING:  Patient appears to be adjusting well to the facility. He seemed to be in a good mood and utilizes humor as a coping mechanism. His wife is supportive.  4. PERSONAL EMERGENCY PLAN:  Per facility protocol 5. COMMUNITY RESOURCES COORDINATION/ HEALTH CARE NAVIGATION: Patient is currently receiving PT/OT services.  6. FINANCIAL/LEGAL CONCERNS/INTERVENTIONS:  No financial or legal  interventions.      SOCIAL HX:  Social History   Tobacco Use  . Smoking status: Former Smoker    Packs/day: 1.00    Years: 45.00    Pack years: 45.00    Types: Cigarettes    Quit date: 2011    Years since quitting: 10.4  . Smokeless tobacco: Never Used  Substance Use Topics  . Alcohol use: No    Alcohol/week: 0.0 standard drinks    Comment: 08/14/2013 "used to drink beer; quit:in 1982"    CODE STATUS: DNR ADVANCED DIRECTIVES: N MOST FORM COMPLETE: No HOSPICE EDUCATION PROVIDE: NO  PPS: Patient is receiving PT/OT and is progressing well. He is alert and oriented x3.       [address] [place], 

## 2019-12-16 ENCOUNTER — Non-Acute Institutional Stay (SKILLED_NURSING_FACILITY): Payer: Medicare Other | Admitting: Internal Medicine

## 2019-12-16 DIAGNOSIS — F339 Major depressive disorder, recurrent, unspecified: Secondary | ICD-10-CM

## 2019-12-16 DIAGNOSIS — J449 Chronic obstructive pulmonary disease, unspecified: Secondary | ICD-10-CM

## 2019-12-16 DIAGNOSIS — E119 Type 2 diabetes mellitus without complications: Secondary | ICD-10-CM | POA: Diagnosis not present

## 2019-12-16 DIAGNOSIS — I5042 Chronic combined systolic (congestive) and diastolic (congestive) heart failure: Secondary | ICD-10-CM

## 2019-12-16 DIAGNOSIS — I4821 Permanent atrial fibrillation: Secondary | ICD-10-CM

## 2019-12-16 DIAGNOSIS — J069 Acute upper respiratory infection, unspecified: Secondary | ICD-10-CM | POA: Diagnosis not present

## 2019-12-16 LAB — CBC AND DIFFERENTIAL
HCT: 31 — AB (ref 41–53)
Hemoglobin: 9.7 — AB (ref 13.5–17.5)
Platelets: 200 (ref 150–399)
WBC: 6.5

## 2019-12-16 LAB — BASIC METABOLIC PANEL
BUN: 33 — AB (ref 4–21)
CO2: 30 — AB (ref 13–22)
Chloride: 101 (ref 99–108)
Glucose: 126
Potassium: 3.8 (ref 3.4–5.3)
Sodium: 144 (ref 137–147)

## 2019-12-16 LAB — CBC: RBC: 4.01 (ref 3.87–5.11)

## 2019-12-16 LAB — COMPREHENSIVE METABOLIC PANEL: Calcium: 9.2 (ref 8.7–10.7)

## 2019-12-16 NOTE — Progress Notes (Signed)
This is an acute visit.  Level of care skilled.  [facility]ility [facility]  Chief complaint acute visit status post hospitalization for for syncope/CHF exacerbation-.  History of present illness.  Patient is a pleasant 74 year old male who is being admitted to skilled nursing for rehab after hospitalization after experiencing what was thought to be a suspected presyncopal episode.  Apparently he fell from standing position after walking a short distance.  Work-up did not really show any acute process or fracture.  It was thought this was orthostatic versus vasovagal-blood pressure was low and Coreg was discontinued.  He also was treated for CHF exacerbation with diuresis.  He continues on oral Lasix.  He does have a history of combined systolic and diastolic CHF thought to be in the end stages he also has history of an AICD.  And significant COPD on chronic oxygen.  He is not on a beta-blocker secondary to hypotension concerns.  He also has a history of atrial fibrillation status post AV nodule ablation he cannot tolerate a beta-blocker it appears.   He also has a history of type 2 diabetes hemoglobin A1c was 7.4 most recent-he continues on Glucophage 1000 mg twice daily as well as sliding scale insulin his CBG was 150 this morning   He also has a history of stage I non-small cell lung cancer status post left wedge resection.  In addition they found a masslike appearance of the cecum on CT imaging in the hospital.  He was thought not to being a candidate for aggressive work-up at this time with his comorbidities.  He has been seen by palliative care.  Currently he is sitting on the side of the bed comfortably does not really complain of any chest pain or shortness of breath-vital signs appear to be stable.      Past Medical History:  Diagnosis Date  . Atrial septal defect    Closed with surgery January, 2010  . Automatic implantable cardioverter-defibrillator in  situ    LV dysfunction and pacer needed for AV node lesion  . Chronic combined systolic and diastolic CHF (congestive heart failure) ([place])   . Colon polyps   . COPD (chronic obstructive pulmonary disease) (HCC)    O2- 2 liters, nasal cannula, q night   . CVA (cerebral vascular accident) ([place]) 2009   denies residual on 08/14/2013  . Diabetes mellitus without complication ([place])    type 2  . Dyslipidemia   . Endocarditis    Bacterial, 2009  . Hypertension   . Intracranial hemorrhage (HCC)    Coumadin cannot be used because of the history of his bleed  . Lung cancer ([place]) 11/29/2013   T1N0 Stage Ia non-small cell carcinoma left lung treated with wedge resection  . Myocardial infarction ([place]) 2010  . Pacemaker    combo pacer and icd  . Permanent atrial fibrillation    Originally Coumadin use for atrial fibrillation // he had intracerebral hemorrhage with an INR of 2.3 June, 2009. Anticoagulation could no longer be used. // Rapid atrial fibrillation after inferior MI October, 2010..........Marland Kitchen AV node ablation done at that time with ICD pacemaker placed (EF 35%). // Left atrial appendage tied off at the time of mitral valve surgery January, 2010 (maze pro  . Pneumonia 07/2018  . Pulmonary hypertension (HCC)    Moderate  . Renal artery stenosis (HCC)    Mild by history  . 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 Spontaneous pneumothorax    right thoracotomy -  distant past  . Status post minimally invasive mitral valve replacement with bioprosthetic valve    33 mm Medtronic Mosaic porcine bioprosthesis placed via right mini thoracotomy for bacterial endocarditis complicated by severe MR and CHF   . Thoracic aortic aneurysm ([place]) 08/11/2016   a - Chest CTA 1/18: Aneurysmal dilatation of aortic root is noted at 5.1 cm.         Past Surgical History:  Procedure Laterality Date  . APPENDECTOMY    . ASD REPAIR, SECUNDUM  07/17/2008   pericardial patch closure of ASD  . AV NODE ABLATION  07/2008    for rapid atrial fib  . CARDIAC CATHETERIZATION    . CARDIAC DEFIBRILLATOR PLACEMENT  ~ [address] [place]  . CARDIAC VALVE REPLACEMENT    . CATARACT EXTRACTION W/ INTRAOCULAR LENS IMPLANT, BILATERAL Bilateral   . ESOPHAGOGASTRODUODENOSCOPY (EGD) WITH PROPOFOL N/A 09/29/2016   Procedure: ESOPHAGOGASTRODUODENOSCOPY (EGD) WITH PROPOFOL; Surgeon: [name], MD; Location: [facility] ENDOSCOPY; Service: Endoscopy; Laterality: N/A;  . HERNIA REPAIR    . IMPLANTABLE CARDIOVERTER DEFIBRILLATOR (ICD) GENERATOR CHANGE N/A 02/06/2012   Procedure: ICD GENERATOR CHANGE; Surgeon: [name], MD; Location: [facility] CATH LAB; Service: Cardiovascular; Laterality: N/A;  . INSERT / REPLACE / REMOVE PACEMAKER    . MASS BIOPSY Left    neck mass  . MITRAL VALVE REPLACEMENT Right 07/17/2008   58mm Medtronic Mosaic porcine bioprosthesis  . PENILE PROSTHESIS IMPLANT    . RIGHT HEART CATHETERIZATION N/A 08/16/2013   Procedure: RIGHT HEART CATH; Surgeon: Josue Hector, MD; Location: [facility] CATH LAB; Service: Cardiovascular; Laterality: N/A;  . TEE WITHOUT CARDIOVERSION N/A 09/06/2016   Procedure: TRANSESOPHAGEAL ECHOCARDIOGRAM (TEE); Surgeon: Dorothy Spark, MD; Location: [facility] ENDOSCOPY; Service: Cardiovascular; Laterality: N/A;  . THORACOTOMY Right 1970's   spontaneous pneumothorax - while in the [place]  . TOE SURGERY     left foot hammer toe  . TONSILLECTOMY    . VIDEO ASSISTED THORACOSCOPY (VATS)/WEDGE RESECTION Left 11/29/2013   Procedure: Video assisted thoracoscopy for wedge resection; mini thoracotomy; Surgeon: [name], MD; Location: [place]; Service: Thoracic; Laterality: Left;   Social History:  reports that he quit smoking about 10 years ago. His smoking use included cigarettes. He has a 45.00 pack-year smoking history. He has never used smokeless tobacco. He reports that he does not drink alcohol or use drugs.       Allergies  Allergen Reactions  . Anticoagulant Compound Other (See Comments)    Pt had  intracranial bleed, therefore all anticoagulation is contraindicated per Dr. Ron Parker  . Other Other (See Comments)    Per Dr. Halford Chessman (Surgeon): stated that the patient cannot be put under for any surgery, as he has an enlarged aorta. He would stand only a 50/50 chance of surviving. He has lung issues, diminished lung tissue, COPD, and emphysema.  . Warfarin Sodium Other (See Comments)    Pt had intracranial bleed, therefore all anticoagulation is contraindicated per Dr. Ron Parker        Family History  Problem Relation Age of Onset  . Stomach cancer Father   . Stroke Mother               MEDICATIONS      acetaminophen 500 MG tablet Commonly known as: TYLENOL Take 2 tablets (1,000 mg total) by mouth every 8 (eight) hours. What changed: when to take this   aspirin EC 81 MG tablet Take 1 tablet (81 mg total) by mouth daily.  atorvastatin 10 MG tablet Commonly known as: LIPITOR Take 10 mg by mouth daily.   bethanechol 10 MG tablet Commonly known as: URECHOLINE Take 1 tablet (10 mg total) by mouth 3 (three) times daily.   Breztri Aerosphere 160-9-4.8 MCG/ACT Aero Generic drug: Budeson-Glycopyrrol-Formoterol Inhale 2 puffs into the lungs in the morning and at bedtime.   citalopram 10 MG tablet Commonly known as: CELEXA Take 1 tablet (10 mg total) by mouth daily.   cyanocobalamin 1000 MCG tablet Take 1 tablet (1,000 mcg total) by mouth daily.   digoxin 0.125 MG tablet Commonly known as: LANOXIN Take 1 tablet (0.125 mg total) by mouth daily.   furosemide 80 MG tablet Commonly known as: LASIX Take 1 tablet (80 mg total) by mouth 2 (two) times daily. What changed:   medication strength  how much to take   levalbuterol 0.63 MG/3ML nebulizer solution Commonly known as: XOPENEX Take 3 mLs (0.63 mg total) by nebulization 2 (two) times daily.   metFORMIN 1000 MG tablet Commonly known as: GLUCOPHAGE TAKE 1 TABLET BY MOUTH TWICE A DAY WITH MEALS     nitroGLYCERIN 0.4 MG SL tablet Commonly known as: NITROSTAT Place 0.4 mg under the tongue every 5 (five) minutes x 3 doses as needed for chest pain.   pantoprazole 40 MG tablet Commonly known as: PROTONIX Take 1 tablet (40 mg total) by mouth daily at 6 (six) AM.   potassium chloride SA 20 MEQ tablet Commonly known as: KLOR-CON Take 2 tablets (40 mEq total) by mouth daily.   tamsulosin 0.4 MG Caps capsule Commonly known as: FLOMAX Take 1 capsule (0.4 mg total) by mouth daily after supper.         Review of systems.  In general is not complaining of any fever or chills-says he feels relatively well.  Skin does not complain of rashes or itching.  He does have a history of pressure ulcer to his coccyx which is followed by wound care.  Head ears eyes nose mouth and throat not complain of visual changes or sore throat.  Respiratory is on chronic oxygen but denies any increased shortness of breath beyond baseline or increasing cough.  Cardiac does not complain of chest pain or increasing edema from baseline.  GI is not complaining of abdominal pain nausea vomiting diarrhea constipation.  GU does not complain of dysuria.  Musculoskeletal does have weakness but is not complaining of joint pain at this time.  Neurologic positive for weakness does not at this point complain of dizziness headache.  Psych does not complain of being depressed or anxious   Physical exam.  Temperature is 98.4 pulse 72 respirations 18 blood pressure 112/60.  In general this is a pleasant frail-appearing elderly male in no distress.  His skin is warm and dry.  Eyes visual acuity appears to be intact sclera and conjunctive are clear he has prescription lenses.  Oropharynx is clear mucous membranes appear fairly moist.  Chest is clear to auscultation with shallow air entry could not appreciate any labored breathing.  Heart is irregular irregular rate and rhythm he has trace lower  extremity edema.  Abdomen is soft nontender with positive bowel sounds.  Musculoskeletal has general frailty but is able to move all extremities x4.  Psych he is pleasant and appropriate largely alert and oriented.  Labs.  December 16, 2019.  WBC 6.5 hemoglobin 9.7.  Platelets 260  Sodium 144 potassium 3.8 BUN 33.3 creatinine 0.90.  December 12, 2019.  Sodium 136 potassium 3.6 BUN  21 creatinine 0.89.  Dec 08, 2019.  WBC 7.2 hemoglobin 10.2 platelets 286.   Assessment and plan.  1.  History of fall suspect presyncopal-orthostasis-work-up was negative for acute process this was thought to be orthostatic versus vasovagal.  Coreg was DC'd secondary to soft blood pressures.  2.  History of combined systolic and diastolic CHF complicated with COPD has a listed ejection fraction of 25 to 30% with a history of AICD.  He continues on Lasix 80 mg twice daily and potassium 40 mEq a day at this point we will continue to monitor clinically he appears relatively stable but is at risk for exacerbation.  3.-History of atrial fibrillation he is not on Coumadin with a history of intracranial hemorrhage in the past-he is not on a beta-blocker because of hypotension concerns he is status post AV node nodule ablation.  He does continue on aspirin 81 mg a day as well as digoxin 0.125 mg a day.  4.  History of COPD thought to be severe he continues on oxygen and bronchodilators includingBrestri-aerosphere twice a day and Xopenex twice daily.  He continues on oxygen at this point continue supportive care and monitor.  5.  History of type 2 diabetes he continues on Glucophage 1000 mg twice daily CBG was 150 this a.m. at this point will monitor.  6.  History of hypertension again beta-blocker was discontinued because of soft blood pressures blood pressure is 112/60 at this point will monitor.  7.  History of hyperlipidemia not stated as uncontrolled he continues on a statin-continues on Lipitor 10 mg a  day.  8.  History of stage I non-small lung cancer status post left wedge resection-suggestion for possible outpatient follow-up.  9.  History of depression continues on Celexa 10 mg a day not stated as uncontrolled will monitor.  10.  History of gout he is on Urecholine 10 mg 3 times daily at this point appears to be stable.  11.  History of masslike appearance of cecum on CT imaging-apparently an incidental finding-suggestion for possible outpatient work-up thought not at this point to be a candidate for aggressive work-up-he has been seen by palliative care and will be followed by palliative care here as well.  12.  History of pressure ulcer coccyx could not be evaluated today because of patient positioning this will be followed by wound care and was noted in the hospital.  [id number]-of note greater than 35 minutes spent on this discharge summary-greater than 50% of time spent coordinating formulating a plan of care for numerous diagnoses-

## 2019-12-17 ENCOUNTER — Encounter: Payer: Self-pay | Admitting: Internal Medicine

## 2019-12-17 ENCOUNTER — Non-Acute Institutional Stay (SKILLED_NURSING_FACILITY): Payer: Medicare Other | Admitting: Internal Medicine

## 2019-12-17 DIAGNOSIS — J449 Chronic obstructive pulmonary disease, unspecified: Secondary | ICD-10-CM | POA: Diagnosis not present

## 2019-12-17 DIAGNOSIS — I4821 Permanent atrial fibrillation: Secondary | ICD-10-CM | POA: Diagnosis not present

## 2019-12-17 DIAGNOSIS — F339 Major depressive disorder, recurrent, unspecified: Secondary | ICD-10-CM | POA: Diagnosis not present

## 2019-12-17 DIAGNOSIS — Z66 Do not resuscitate: Secondary | ICD-10-CM

## 2019-12-17 DIAGNOSIS — E119 Type 2 diabetes mellitus without complications: Secondary | ICD-10-CM

## 2019-12-17 DIAGNOSIS — I5042 Chronic combined systolic (congestive) and diastolic (congestive) heart failure: Secondary | ICD-10-CM | POA: Diagnosis not present

## 2019-12-17 DIAGNOSIS — K6389 Other specified diseases of intestine: Secondary | ICD-10-CM

## 2019-12-17 NOTE — Progress Notes (Signed)
Provider:  Rexene Edison. Mariea Clonts, D.O., C.M.D. Location:  [place] of Service:  SNF (31)  PCP: Gayland Curry, DO Patient Care Team: Gayland Curry, DO as PCP - General (Geriatric Medicine) [name], MD as PCP - Cardiology (Cardiology) [name], MD as PCP - Electrophysiology (Cardiology) Rigoberto Noel, MD as Consulting Physician (Pulmonary Disease) Carlena Bjornstad, MD as Consulting Physician (Cardiology)  Extended Emergency Contact Information Primary Emergency Contact: Pore,Judy Address: [address]          [place], [place] [address] Johnnette Litter of [place] Phone: [phone] Relation: Spouse Secondary Emergency Contact: Taitano,tammy Mobile Phone: [phone] Relation: Daughter  Code Status: DNR, MOST Goals of Care: Advanced Directive information Advanced Directives 12/24/2019  Does Patient Have a Medical Advance Directive? Yes  Type of Advance Directive Out of facility DNR (pink MOST or yellow form)  Does patient want to make changes to medical advance directive? No - Patient declined  Copy of [place] in Chart? -  Would patient like information on creating a medical advance directive? No - Patient declined  Pre-existing out of facility DNR order (yellow form or pink MOST form) Pink MOST form placed in chart (order not valid for inpatient use)      Chief Complaint  Patient presents with  . New Admit To SNF    New admission to SNF     HPI: Patient is a 74 y.o. male seen today for admission to [facility] for [place] s/p hospitalization with chest pain, syncope.  He has a h/o COPD GOLD II, htn,intracranial hemorrhage, hyperlipidemia, DMII, permanent afib, ICD, h/o ischemic cardiomyopathy, respiratory failure with hypoxia, chronic combined chf with EF 25-30%, AV node ablation, bioprosthetic mitral valve replacement for severe MR, pulmonary htn.  Please see acute visit note from [place]  for full admission details.  During his hospitalization from 5/28-6/3 at [facility], he was found to have a left clavicular head fracture and a possible cecal mass on his imaging.  Palliative care has been ordered as per hospital recommendations and goals of care being comfort-based.    When seen, he had no complaints besides being thirsty on his fluid restriction--was requesting a "cold beverage".  Upon discharge home, he should return to his PCP outpatient.  He is temporarily under the care of [place] during his time at [facility] Past Medical History:  Diagnosis Date  . Atrial septal defect    Closed with surgery January, 2010  . Automatic implantable cardioverter-defibrillator in situ    LV dysfunction and pacer needed for AV node lesion  . Chronic combined systolic and diastolic CHF (congestive heart failure) ([place])   . Colon polyps   . COPD (chronic obstructive pulmonary disease) (HCC)    O2- 2 liters, nasal cannula, q night   . CVA (cerebral vascular accident) ([place]) 2009   denies residual on 08/14/2013  . Diabetes mellitus without complication ([place])    type 2  . Dyslipidemia   . Endocarditis    Bacterial, 2009  . Hypertension   . Intracranial hemorrhage (HCC)    Coumadin cannot be used because of the history of his bleed  . Lung cancer ([place]) 11/29/2013   T1N0 Stage Ia non-small cell carcinoma left lung treated with wedge resection  . Myocardial infarction ([place]) 2010  . Pacemaker    combo pacer and icd  . Permanent atrial fibrillation    Originally Coumadin  use for atrial fibrillation  //   he had intracerebral hemorrhage with an INR of 2.3 June, 2009. Anticoagulation could no longer be used.  //  Rapid atrial fibrillation after inferior MI October, 2010..........Marland Kitchen AV node ablation done at that time with ICD pacemaker placed (EF 35%).   //   Left atrial appendage tied off at the time of mitral valve surgery January, 2010 (maze pro  . Pneumonia 07/2018  . Pulmonary hypertension (HCC)     Moderate  . Renal artery stenosis (HCC)    Mild by history  . 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 Spontaneous pneumothorax    right thoracotomy - distant past  . Status post minimally invasive mitral valve replacement with bioprosthetic valve    33 mm Medtronic Mosaic porcine bioprosthesis placed via right mini thoracotomy for bacterial endocarditis complicated by severe MR and CHF   . Thoracic aortic aneurysm ([place]) 08/11/2016   a - Chest CTA 1/18:  Aneurysmal dilatation of aortic root is noted at 5.1 cm.    Past Surgical History:  Procedure Laterality Date  . APPENDECTOMY    . ASD REPAIR, SECUNDUM  07/17/2008   pericardial patch closure of ASD  . AV NODE ABLATION  07/2008   for rapid atrial fib  . CARDIAC CATHETERIZATION    . CARDIAC DEFIBRILLATOR PLACEMENT  ~ [address] [place]  . CARDIAC VALVE REPLACEMENT    . CATARACT EXTRACTION W/ INTRAOCULAR LENS  IMPLANT, BILATERAL Bilateral   . ESOPHAGOGASTRODUODENOSCOPY (EGD) WITH PROPOFOL N/A 09/29/2016   Procedure: ESOPHAGOGASTRODUODENOSCOPY (EGD) WITH PROPOFOL;  Surgeon: [name], MD;  Location: [facility] ENDOSCOPY;  Service: Endoscopy;  Laterality: N/A;  . HERNIA REPAIR    . IMPLANTABLE CARDIOVERTER DEFIBRILLATOR (ICD) GENERATOR CHANGE N/A 02/06/2012   Procedure: ICD GENERATOR CHANGE;  Surgeon: [name], MD;  Location: [facility] CATH LAB;  Service: Cardiovascular;  Laterality: N/A;  . INSERT / REPLACE / REMOVE PACEMAKER    . MASS BIOPSY Left    neck mass  . MITRAL VALVE REPLACEMENT Right 07/17/2008   39mm Medtronic Mosaic porcine bioprosthesis  . PENILE PROSTHESIS IMPLANT    . RIGHT HEART CATHETERIZATION N/A 08/16/2013   Procedure: RIGHT HEART CATH;  Surgeon: Josue Hector, MD;  Location: [facility] CATH LAB;  Service: Cardiovascular;  Laterality: N/A;  . TEE WITHOUT CARDIOVERSION N/A 09/06/2016   Procedure: TRANSESOPHAGEAL ECHOCARDIOGRAM (TEE);  Surgeon: Dorothy Spark, MD;  Location: [facility] ENDOSCOPY;  Service: Cardiovascular;  Laterality: N/A;  .  THORACOTOMY Right 1970's   spontaneous pneumothorax - while in the [employer]  . TOE SURGERY     left foot hammer toe  . TONSILLECTOMY    . VIDEO ASSISTED THORACOSCOPY (VATS)/WEDGE RESECTION Left 11/29/2013   Procedure: Video assisted thoracoscopy for wedge resection; mini thoracotomy;  Surgeon: [name], MD;  Location: [place];  Service: Thoracic;  Laterality: Left;    Social History   Socioeconomic History  . Marital status: Married    Spouse name: [name]  . Number of children: 2  . Years of education: College  . Highest education level: Not on file  Occupational History  . 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 Smoking status: Former Smoker    Packs/day: 1.00    Years: 45.00    Pack years: 45.00    Types: Cigarettes    Quit date: 2011    Years since quitting: 10.4  . Smokeless tobacco: Never Used  Vaping Use  . Vaping Use: Never used  Substance and Sexual Activity  . Alcohol use: No    Alcohol/week: 0.0 standard drinks    Comment: 08/14/2013 "used to drink beer; quit:in 1982"  . Drug use: No  . Sexual activity: Yes  Other Topics Concern  . Not on file  Social History Narrative   Patient lives at home with his spouse.   Worked for the post office   Has two boys and a girl. All live local.    1 Mining engineer   Social Determinants of Health   Financial Resource Strain: Low Risk   . Difficulty of Paying Living Expenses: Not hard at all  Food Insecurity: No Food Insecurity  . Worried About Charity fundraiser in the Last Year: Never true  . Ran Out of Food in the Last Year: Never true  Transportation Needs: No Transportation Needs  . Lack of Transportation (Medical): No  . Lack of Transportation (Non-Medical): No  Physical Activity: Inactive  . Days of Exercise per Week: 0 days  . Minutes of Exercise per Session: 0 min  Stress: No Stress Concern Present  . Feeling of Stress : Only a little  Social  Connections: Unknown  . Frequency of Communication with Friends and Family: More than three times a week  . Frequency of Social Gatherings with Friends and Family: Three times a week  . Attends Religious Services: Not on file  . Active Member of Clubs or Organizations: Not on file  . Attends Archivist Meetings: Not on file  . Marital Status: Married    reports that he quit smoking about 10 years ago. His smoking use included cigarettes. He has a 45.00 pack-year smoking history. He has never used smokeless tobacco. He reports that he does not drink alcohol and does not use drugs.  Functional Status Survey:    Family History  Problem Relation Age of Onset  . Stomach cancer Father   . Stroke Mother     Health Maintenance  Topic Date Due  . COVID-19 Vaccine (1) Never done  . OPHTHALMOLOGY EXAM  11/03/2015  . COLONOSCOPY  12/15/2015  . URINE MICROALBUMIN  01/22/2016  . INFLUENZA VACCINE  02/09/2020  . FOOT EXAM  04/11/2020  . HEMOGLOBIN A1C  05/16/2020  . TETANUS/TDAP  08/02/2021  . Hepatitis C Screening  Completed  . PNA vac Low Risk Adult  Completed    Allergies  Allergen Reactions  . Anticoagulant Compound Other (See Comments)    Pt had intracranial bleed, therefore all anticoagulation is contraindicated per Dr. Ron Parker  . Other Other (See Comments)    Per Dr. Halford Chessman (Surgeon): stated that the patient cannot be put under for any surgery, as he has an enlarged aorta. He would stand only a 50/50 chance of surviving. He has lung issues, diminished lung tissue, COPD, and emphysema.  . Warfarin Sodium Other (See Comments)    Pt had intracranial bleed, therefore all anticoagulation is contraindicated per Dr. Ron Parker    No facility-administered encounter medications on file as of 12/17/2019.   Outpatient Encounter Medications as of 12/17/2019  Medication Sig  . acetaminophen (TYLENOL) 500 MG tablet Take 2 tablets (1,000 mg total) by mouth every 8 (eight) hours.  Marland Kitchen  aspirin EC 81 MG tablet Take 1 tablet (81 mg total) by mouth daily.  Marland Kitchen atorvastatin (LIPITOR) 10 MG tablet Take 10 mg by mouth daily.  . Budeson-Glycopyrrol-Formoterol (BREZTRI AEROSPHERE) 160-9-4.8 MCG/ACT AERO Inhale  2 puffs into the lungs in the morning and at bedtime.  . citalopram (CELEXA) 10 MG tablet Take 1 tablet (10 mg total) by mouth daily.  . digoxin (LANOXIN) 0.125 MG tablet Take 1 tablet (0.125 mg total) by mouth daily.  . furosemide (LASIX) 80 MG tablet Take 1 tablet (80 mg total) by mouth 2 (two) times daily.  Marland Kitchen levalbuterol (XOPENEX) 0.63 MG/3ML nebulizer solution Take 3 mLs (0.63 mg total) by nebulization 2 (two) times daily.  . metFORMIN (GLUCOPHAGE) 1000 MG tablet TAKE 1 TABLET BY MOUTH TWICE A DAY WITH MEALS  . nitroGLYCERIN (NITROSTAT) 0.4 MG SL tablet Place 0.4 mg under the tongue every 5 (five) minutes x 3 doses as needed for chest pain.   . pantoprazole (PROTONIX) 40 MG tablet Take 1 tablet (40 mg total) by mouth daily at 6 (six) AM.  . tamsulosin (FLOMAX) 0.4 MG CAPS capsule Take 1 capsule (0.4 mg total) by mouth daily after supper.  . vitamin B-12 1000 MCG tablet Take 1 tablet (1,000 mcg total) by mouth daily.  . bethanechol (URECHOLINE) 10 MG tablet Take 1 tablet (10 mg total) by mouth 3 (three) times daily.  . potassium chloride SA (KLOR-CON) 20 MEQ tablet Take 2 tablets (40 mEq total) by mouth daily.    Review of Systems  Constitutional: Positive for malaise/fatigue. Negative for chills and fever.  HENT: Negative for congestion and sore throat.   Eyes: Negative for blurred vision.       Glasses  Respiratory: Positive for shortness of breath.   Cardiovascular: Positive for orthopnea and PND. Negative for chest pain, palpitations and leg swelling.  Gastrointestinal: Negative for abdominal pain, blood in stool, constipation and melena.  Genitourinary: Negative for dysuria.  Musculoskeletal: Negative for falls.  Neurological: Positive for weakness. Negative for loss  of consciousness.  Endo/Heme/Allergies: Bruises/bleeds easily.  Psychiatric/Behavioral: Negative for depression.    Vitals:   12/17/19 1415  BP: 98/62  Weight: 151 lb 1.6 oz (68.5 kg)  Height: 6\' 1"  (1.854 m)   Body mass index is 19.94 kg/m. Physical Exam Vitals reviewed.  HENT:     Right Ear: External ear normal.     Left Ear: External ear normal.     Nose: Nose normal.     Mouth/Throat:     Pharynx: Oropharynx is clear.  Eyes:     Extraocular Movements: Extraocular movements intact.     Pupils: Pupils are equal, round, and reactive to light.     Comments: glasses  Cardiovascular:     Rate and Rhythm: Rhythm irregular.     Heart sounds: Murmur heard.   Pulmonary:     Effort: Pulmonary effort is normal.     Breath sounds: Rhonchi present. No rales.  Abdominal:     General: Bowel sounds are normal. There is no distension.     Palpations: Abdomen is soft.     Tenderness: There is no abdominal tenderness. There is no guarding or rebound.  Musculoskeletal:        General: Normal range of motion.     Cervical back: Neck supple.     Right lower leg: No edema.     Left lower leg: No edema.  Skin:    General: Skin is warm and dry.  Neurological:     General: No focal deficit present.     Mental Status: He is alert. Mental status is at baseline.     Motor: Weakness present.  Psychiatric:  Mood and Affect: Mood normal.     Labs reviewed: Basic Metabolic Panel: Recent Labs    11/23/19 0633 11/23/19 3664 11/24/19 0324 11/24/19 0324 11/25/19 0449 12/06/19 1824 12/08/19 0415 12/08/19 0415 12/09/19 0401 12/09/19 0401 12/10/19 0617 12/10/19 0617 12/11/19 4034 12/11/19 0639 12/12/19 0717 12/16/19 0000 12/24/19 0525  NA 136   < > 136   < > 138   < > 139   < > 137   < > 137   < > 135   < > 138 144 137  K 4.1   < > 3.7   < > 4.0   < > 3.5   < > 4.1   < > 3.9   < > 3.9   < > 3.6 3.8 3.6  CL 99   < > 99   < > 100   < > 99   < > 97*   < > 95*   < > 93*   < >  97* 101 104  CO2 30   < > 28   < > 30   < > 31   < > 30   < > 30   < > 29   < > 32 30* 26  GLUCOSE 111*   < > 126*   < > 136*   < > 151*   < > 138*   < > 137*   < > 149*  --  143*  --  186*  BUN 14   < > 16   < > 17   < > 17   < > 20   < > 21   < > 21   < > 21 33* 33*  CREATININE 0.83   < > 0.83   < > 0.99   < > 1.02   < > 1.01   < > 1.03   < > 1.20  --  0.89  --  0.65  CALCIUM 8.4*   < > 8.5*   < > 8.4*   < > 8.3*   < > 8.4*   < > 8.3*   < > 8.5*   < > 8.5* 9.2 6.9*  MG 2.1   < > 2.0   < > 2.0   < > 1.8  --  2.1  --  2.1  --   --   --   --   --   --   PHOS 3.7  --  4.0  --  4.0  --   --   --   --   --   --   --   --   --   --   --   --    < > = values in this interval not displayed.   Liver Function Tests: Recent Labs    11/20/19 0820 11/20/19 0820 11/21/19 0735 11/21/19 0735 11/22/19 0625 11/23/19 7425 11/24/19 0324 11/25/19 0449 12/24/19 0525  AST 12*  --  14*  --   --   --   --   --  11*  ALT 15  --  16  --   --   --   --   --  8  ALKPHOS 64  --  66  --   --   --   --   --  53  BILITOT 1.7*   < > 2.1*  --  1.5*  --   --   --  0.5  PROT 5.6*  --  6.2*  --   --   --   --   --  4.7*  ALBUMIN 2.7*   < > 3.0*   < > 2.5*   < > 2.8* 2.6* 2.5*   < > = values in this interval not displayed.   Recent Labs    12/24/19 0525  LIPASE 25   No results for input(s): AMMONIA in the last 8760 hours. CBC: Recent Labs    11/20/19 0820 11/23/19 2694 12/07/19 0029 12/07/19 0029 12/08/19 0415 12/16/19 0000 12/24/19 0525  WBC 7.2   < > 6.2   < > 7.2 6.5 7.9  NEUTROABS 5.9  --   --   --  5.6  --  PENDING  HGB 8.5*   < > 10.3*   < > 10.2* 9.7* 6.5*  HCT 31.2*   < > 37.5*   < > 35.6* 31* 22.5*  MCV 76.8*   < > 84.7  --  83.6  --  83.6  PLT 198   < > 305   < > 286 200 177   < > = values in this interval not displayed.   Cardiac Enzymes: No results for input(s): CKTOTAL, CKMB, CKMBINDEX, TROPONINI in the last 8760 hours. BNP: Invalid input(s): POCBNP Lab Results  Component Value Date    HGBA1C 7.4 (H) 11/14/2019   Lab Results  Component Value Date   TSH 3.37 04/12/2019   Lab Results  Component Value Date   VITAMINB12 247 11/16/2019   Lab Results  Component Value Date   FOLATE 13.1 11/16/2019   Lab Results  Component Value Date   IRON 12 (L) 11/16/2019   TIBC 371 11/16/2019   FERRITIN 10 (L) 11/16/2019    Imaging and Procedures obtained prior to SNF admission: DG Chest 2 View  Result Date: 12/06/2019 CLINICAL DATA:  Chest pain and shortness of breath. Previous myocardial infarct congestive heart failure. EXAM: CHEST - 2 VIEW COMPARISON:  11/17/2019 FINDINGS: Moderate cardiac enlargement shows no significant change. Transvenous pacemaker remains in appropriate position. Pulmonary emphysema again noted. Increased atelectasis or consolidation is seen in the retrocardiac left lower lobe since prior study. Small bilateral pleural effusions show no significant change. Diffuse interstitial infiltrates are also stable, consistent with diffuse interstitial edema. IMPRESSION: Increased left basilar atelectasis versus consolidation. Stable cardiomegaly, diffuse interstitial infiltrates, and small bilateral pleural effusions, consistent with congestive heart failure. Electronically Signed   By: Marlaine Hind M.D.   On: 12/06/2019 18:55   DG Thoracic Spine 2 View  Result Date: 12/06/2019 CLINICAL DATA:  Fall.  Thoracic back pain.  Initial encounter. EXAM: THORACIC SPINE 2 VIEWS COMPARISON:  None. FINDINGS: There is no evidence of thoracic spine fracture. Alignment is normal. No focal lytic or sclerotic bone lesions identified. Mild degenerative disc disease seen in the mid and lower thoracic spine, and cervical spine. Generalized osteopenia also noted. IMPRESSION: No acute findings. Degenerative spondylosis, as described above.  Osteopenia. Electronically Signed   By: Marlaine Hind M.D.   On: 12/06/2019 19:40   DG Lumbar Spine Complete  Result Date: 12/06/2019 CLINICAL DATA:   74 year old male with fall and back pain. EXAM: LUMBAR SPINE - COMPLETE 4+ VIEW COMPARISON:  Lumbar spine radiograph dated 11/16/2019. FINDINGS: Five lumbar type vertebra. There is no acute fracture or subluxation of the lumbar spine. Multilevel mild chronic compression injury most prominent involving L1 and L4. Multilevel degenerative changes with bone spurring. Multilevel disc desiccation and vacuum phenomena. Grade 1 L3-L4 retrolisthesis and L4-L5 anterolisthesis. Atherosclerotic  calcification of the abdominal aorta. IMPRESSION: No acute/traumatic lumbar spine pathology. Electronically Signed   By: Anner Crete M.D.   On: 12/06/2019 19:39   CT Head Wo Contrast  Result Date: 12/06/2019 CLINICAL DATA:  74 year old male with head trauma. EXAM: CT HEAD WITHOUT CONTRAST CT CERVICAL SPINE WITHOUT CONTRAST TECHNIQUE: Multidetector CT imaging of the head and cervical spine was performed following the standard protocol without intravenous contrast. Multiplanar CT image reconstructions of the cervical spine were also generated. COMPARISON:  Head CT dated 09/24/2015. FINDINGS: CT HEAD FINDINGS Brain: Moderate age-related atrophy and chronic microvascular ischemic changes. 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 There is no acute intracranial hemorrhage. No mass effect midline shift. No extra-axial fluid collection. Vascular: No hyperdense vessel or unexpected calcification. Skull: Normal. Negative for fracture or focal lesion. Sinuses/Orbits: No acute finding. Other: None CT CERVICAL SPINE FINDINGS Alignment: No acute subluxation. Skull base and vertebrae: No acute fracture. Osteopenia. Soft tissues and spinal canal: No prevertebral fluid or swelling. No visible canal hematoma. Disc levels:  Multilevel degenerative changes. Upper chest: Emphysema. Partially visualized moderate to large left pleural effusion or hemothorax. Lobulated right pleural effusion or thickening. Increase in the size of the pleural effusions compared to  the CT of 11/07/2019. Other: Fracture of the left clavicular head, acute or possibly subacute new since the prior CT of 11/07/2019. IMPRESSION: 1. No acute intracranial hemorrhage. 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 2.  acute/traumatic cervical spine pathology. 3. Fracture of the left clavicular head, acute or possibly subacute new since the prior CT of 11/07/2019. 4. Partially visualized moderate to large left pleural effusion or hemothorax. Increase in the size of the pleural effusions compared to the CT of 11/07/2019. Electronically Signed   By: Anner Crete M.D.   On: 12/06/2019 20:02   CT Cervical Spine Wo Contrast  Result Date: 12/06/2019 CLINICAL DATA:  74 year old male with head trauma. EXAM: CT HEAD WITHOUT CONTRAST CT CERVICAL SPINE WITHOUT CONTRAST TECHNIQUE: Multidetector CT imaging of the head and cervical spine was performed following the standard protocol without intravenous contrast. Multiplanar CT image reconstructions of the cervical spine were also generated. COMPARISON:  Head CT dated 09/24/2015. FINDINGS: CT HEAD FINDINGS Brain: Moderate age-related atrophy and chronic microvascular ischemic changes. 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 There is no acute intracranial hemorrhage. No mass effect midline shift. No extra-axial fluid collection. Vascular: No hyperdense vessel or unexpected calcification. Skull: Normal. Negative for fracture or focal lesion. Sinuses/Orbits: No acute finding. Other: None CT CERVICAL SPINE FINDINGS Alignment: No acute subluxation. Skull base and vertebrae: No acute fracture. Osteopenia. Soft tissues and spinal canal: No prevertebral fluid or swelling. No visible canal hematoma. Disc levels:  Multilevel degenerative changes. Upper chest: Emphysema. Partially visualized moderate to large left pleural effusion or hemothorax. Lobulated right pleural effusion or thickening. Increase in the size of the pleural effusions  compared to the CT of 11/07/2019. Other: Fracture of the left clavicular head, acute or possibly subacute new since the prior CT of 11/07/2019. IMPRESSION: 1. No acute intracranial hemorrhage. 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 2.  acute/traumatic cervical spine pathology. 3. Fracture of the left clavicular head, acute or possibly subacute new since the prior CT of 11/07/2019. 4. Partially visualized moderate to large left pleural effusion or hemothorax. Increase in the size of the pleural effusions compared to the CT of 11/07/2019. Electronically Signed   By: Anner Crete M.D.   On: 12/06/2019 20:02  CT Angio Chest/Abd/Pel for Dissection W and/or Wo Contrast  Result Date: 12/06/2019 CLINICAL DATA:  Chest pain.  Concern for aortic dissection. EXAM: CT ANGIOGRAPHY CHEST, ABDOMEN AND PELVIS TECHNIQUE: Non-contrast CT of the chest was initially obtained. Multidetector CT imaging through the chest, abdomen and pelvis was performed using the standard protocol during bolus administration of intravenous contrast. Multiplanar reconstructed images and MIPs were obtained and reviewed to evaluate the vascular anatomy. CONTRAST:  146mL OMNIPAQUE IOHEXOL 350 MG/ML SOLN COMPARISON:  11/07/2019 FINDINGS: CTA CHEST FINDINGS Cardiovascular: There is significant cardiac enlargement. The main pulmonary artery is dilated without evidence for an acute pulmonary embolism. There is no evidence for thoracic aortic dissection. Coronary artery calcifications are noted. The arch vessels are grossly patent. 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 This is essentially unchanged from prior study dated 11/07/2019. Mediastinum/Nodes: --there is mild mediastinal adenopathy. This is essentially stable from prior study. --No axillary lymphadenopathy. --No supraclavicular lymphadenopathy. --Normal thyroid gland. --The  esophagus is unremarkable Lungs/Pleura: There are moderate severe emphysematous changes bilaterally. There is interlobular septal thickening. There are moderate-sized bilateral pleural effusions, left greater than right. The right-sided pleural effusion appears to be at least partially loculated. There is bronchial wall thickening and mild mucous plugging at the lung bases bilaterally. There are postsurgical changes in the left upper lobe likely related to prior wedge resection. Musculoskeletal: No chest wall abnormality. No acute or significant osseous findings. Review of the MIP images confirms the above findings. CTA ABDOMEN AND PELVIS FINDINGS VASCULAR Aorta: Normal caliber aorta without aneurysm, dissection, vasculitis or significant stenosis. Celiac: There is a mild loop shaped curvature of the celiac axis with associated narrowing. This can be seen in patients with median arcuate ligament syndrome. SMA: There is variant hepatic arterial anatomy with a replaced common hepatic artery arising from the SMA. Renals: Multiple bilateral renal arteries are noted, all of which appear to be grossly patent. IMA: Patent without evidence of aneurysm, dissection, vasculitis or significant stenosis. Inflow: Patent without evidence of aneurysm, dissection, vasculitis or significant stenosis. Veins: No obvious venous abnormality within the limitations of this arterial phase study. Review of the MIP images confirms the above findings. NON-VASCULAR Hepatobiliary: The liver appears cirrhotic. Normal gallbladder.There is no biliary ductal dilation. Pancreas: Normal contours without ductal dilatation. No peripancreatic fluid collection. Spleen: Unremarkable. Adrenals/Urinary Tract: --Adrenal glands: Unremarkable. --Right kidney/ureter: No hydronephrosis or radiopaque kidney stones. --Left kidney/ureter: No hydronephrosis or radiopaque kidney stones. --Urinary bladder: The urinary bladder is significantly distended. Stomach/Bowel:  --Stomach/Duodenum: No hiatal hernia or other gastric abnormality. Normal duodenal course and caliber. --Small bowel: Unremarkable. --Colon: There is an masslike appearance of the cecum with multiple small mildly enlarged regional lymph nodes. --Appendix: Normal. Lymphatic: --No retroperitoneal lymphadenopathy. --No mesenteric lymphadenopathy. --No pelvic or inguinal lymphadenopathy. Reproductive: Prostate gland is enlarged. Other: There is a mild amount presacral free fluid. There is no free air. There is mild body wall edema. Musculoskeletal. Old compression fractures are noted throughout the lumbar spine with multilevel degenerative changes. These are essentially stable from prior study. Review of the MIP images confirms the above findings. IMPRESSION: 1. No evidence for an acute vascular abnormality. There is no evidence for a thoracic aortic dissection. No evidence for an acute pulmonary embolism. 2. 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 3. Profound cardiomegaly with findings of volume overload including small bilateral pleural effusions and body wall edema. The right-sided pleural effusion appears to  be at least partially loculated. 4. Findings are concerning for a cecal mass as detailed above. Follow-up with nonemergent colonoscopy is recommended. 5. Significantly distended urinary bladder. 6. Findings suspicious for underlying cirrhosis. 7. Relatively stable mediastinal and hilar adenopathy of unknown clinical significance. 8. Severe emphysematous changes with findings consistent with infectious or reactive bronchiolitis at the lung bases. Aortic Atherosclerosis (ICD10-I70.0) and Emphysema (ICD10-J43.9). Electronically Signed   By: Constance Holster M.D.   On: 12/06/2019 20:08    Assessment/Plan 1. Chronic combined systolic and diastolic CHF (congestive heart failure) ([place]) -appears chronically ill -ICD in place with depressed EF -cont fluid  restriction, weights per facility protocol with notification of weight gain, cardiac diet -monitor for edema or increased dyspnea -cont lasix, monitor bmp -palliative care referral placed  2. Permanent atrial fibrillation -with prior AV node ablation, but remains in afib -cont baby asa, dig  3. COPD GOLD II -cont breztri, xopenex, O2 if needed   4. Type 2 diabetes mellitus without complication, without long-term current use of insulin (HCC) -cont metformin, monitor renal function, also on lipitor  5. Depression, recurrent ([place]) -cont celexa therapy--be alert for QT prolongation given recent syncope  6. Cecum mass -found incidentally during last admission--one of the reasons palliative care was recommended at hospital and has been ordered to help further address goals of care and comfort care  7. DNR (do not resuscitate) -order entered today (had already been determined at facility admission and previously on MOST)  Family/ staff Communication: discussed with snf nurse, PA  Labs/tests ordered:  Has f/u cbc, bmp  Maisen Schmit L. Joie Reamer, D.O. [facility] [address], [place] [address] Cell Phone (Mon-Fri 8am-5pm):  [phone] On Call:  [phone] & follow prompts after 5pm & weekends Office Phone:  [phone] Office Fax:  [phone]

## 2019-12-17 NOTE — Addendum Note (Signed)
Addended by: Wille Celeste on: 12/17/2019 11:30 AM   Modules accepted: Level of Service

## 2019-12-18 ENCOUNTER — Other Ambulatory Visit: Payer: Self-pay | Admitting: *Deleted

## 2019-12-18 NOTE — Patient Outreach (Signed)
Screened for potential [facility] Care Management needs as a benefit of  [employer].  Mr. Petrich is currently receiving skilled therapy at [facility].   Writer attended telephonic interdisciplinary team meeting to assess for disposition needs and transition plan for resident.   Facility reports member is from home with spouse. [facility] palliative is following as well. Facility reports attempts being made to schedule care plan meeting. Facilty SW reports she has been unable to reach spouse.  Telephone call made to Birl Lobello [phone]. Patient identifiers confirmed. Mrs. Bouillon states she is not exactly sure what the plan will be upon SNF discharge. States she is not sure if member will come home or go to residential hospice. Mrs. Vassell endorses she has had phone trouble with her land line earlier this week. States her home phone [phone] is still best contact. Advises to leave message if she does not answer.   Will pass information along to facility SW and [facility] palliative staff.   Will continue to follow for transition plans and potential [facility] needs while Mr. Mccarrick resides in SNF.   Marthenia Rolling, MSN-Ed, RN,BSN [place] Acute Care Coordinator [phone] [facility]) [phone]  (Toll free office)

## 2019-12-20 ENCOUNTER — Other Ambulatory Visit: Payer: Self-pay | Admitting: Adult Health

## 2019-12-20 ENCOUNTER — Telehealth: Payer: Self-pay

## 2019-12-20 DIAGNOSIS — R339 Retention of urine, unspecified: Secondary | ICD-10-CM

## 2019-12-20 NOTE — Telephone Encounter (Signed)
(  5:27pm) Palliative Care SW completed a follow-up call to patient's wife-Judy to ensure she got an update on recent visit with patient and follow-up on previous telephonic visit with her. [name] expressed concern about bringing patient home as he will be discharged from [facility] on 01/01/20. She state she can not afford to hire in-home care. She has looked into applying for medicaid and because of patient's retirement pension and [place] pension, he did not qualify. She is now in the process of applying for assistance with LTC placement or in-home care for patient through is [employer] benefits. SW provided clarification and education regarding how palliative care program was being paid for, as she was concern that this maybe a financial burden. SW also provided education on the difference in palliative care and hospice care eligibility and the services that are available under both. SW advised [name] that she is out of the office until the 21 st, but would follow-up with her when she returns. SW reminded her that the palliative care nurse is available for any questions or support. [name] thanked SW for the call and verbalized the clarify and understanding around the information provided.

## 2019-12-24 ENCOUNTER — Other Ambulatory Visit: Payer: Self-pay

## 2019-12-24 ENCOUNTER — Inpatient Hospital Stay (HOSPITAL_COMMUNITY)
Admission: EM | Admit: 2019-12-24 | Discharge: 2020-01-15 | DRG: 377 | Disposition: A | Discharge status: Other Acute Inpatient Hospital | Payer: Medicare Other | Attending: Family Medicine | Admitting: Family Medicine

## 2019-12-24 ENCOUNTER — Encounter (HOSPITAL_COMMUNITY): Payer: Self-pay | Admitting: Emergency Medicine

## 2019-12-24 ENCOUNTER — Emergency Department (HOSPITAL_COMMUNITY): Payer: Medicare Other

## 2019-12-24 ENCOUNTER — Telehealth: Payer: Self-pay

## 2019-12-24 DIAGNOSIS — R338 Other retention of urine: Secondary | ICD-10-CM | POA: Diagnosis present

## 2019-12-24 DIAGNOSIS — R8271 Bacteriuria: Secondary | ICD-10-CM | POA: Diagnosis present

## 2019-12-24 DIAGNOSIS — Z9981 Dependence on supplemental oxygen: Secondary | ICD-10-CM

## 2019-12-24 DIAGNOSIS — C3490 Malignant neoplasm of unspecified part of unspecified bronchus or lung: Secondary | ICD-10-CM | POA: Diagnosis present

## 2019-12-24 DIAGNOSIS — Z7982 Long term (current) use of aspirin: Secondary | ICD-10-CM

## 2019-12-24 DIAGNOSIS — I5043 Acute on chronic combined systolic (congestive) and diastolic (congestive) heart failure: Secondary | ICD-10-CM | POA: Diagnosis not present

## 2019-12-24 DIAGNOSIS — I482 Chronic atrial fibrillation, unspecified: Secondary | ICD-10-CM | POA: Diagnosis present

## 2019-12-24 DIAGNOSIS — J9621 Acute and chronic respiratory failure with hypoxia: Secondary | ICD-10-CM | POA: Diagnosis present

## 2019-12-24 DIAGNOSIS — Z66 Do not resuscitate: Secondary | ICD-10-CM | POA: Diagnosis present

## 2019-12-24 DIAGNOSIS — N401 Enlarged prostate with lower urinary tract symptoms: Secondary | ICD-10-CM | POA: Diagnosis present

## 2019-12-24 DIAGNOSIS — J439 Emphysema, unspecified: Secondary | ICD-10-CM | POA: Diagnosis present

## 2019-12-24 DIAGNOSIS — Z681 Body mass index (BMI) 19 or less, adult: Secondary | ICD-10-CM | POA: Diagnosis not present

## 2019-12-24 DIAGNOSIS — F329 Major depressive disorder, single episode, unspecified: Secondary | ICD-10-CM | POA: Diagnosis present

## 2019-12-24 DIAGNOSIS — J219 Acute bronchiolitis, unspecified: Secondary | ICD-10-CM | POA: Diagnosis present

## 2019-12-24 DIAGNOSIS — R269 Unspecified abnormalities of gait and mobility: Secondary | ICD-10-CM | POA: Diagnosis present

## 2019-12-24 DIAGNOSIS — Z7984 Long term (current) use of oral hypoglycemic drugs: Secondary | ICD-10-CM

## 2019-12-24 DIAGNOSIS — K7030 Alcoholic cirrhosis of liver without ascites: Secondary | ICD-10-CM | POA: Diagnosis present

## 2019-12-24 DIAGNOSIS — K921 Melena: Principal | ICD-10-CM | POA: Diagnosis present

## 2019-12-24 DIAGNOSIS — B952 Enterococcus as the cause of diseases classified elsewhere: Secondary | ICD-10-CM | POA: Diagnosis present

## 2019-12-24 DIAGNOSIS — Z7951 Long term (current) use of inhaled steroids: Secondary | ICD-10-CM

## 2019-12-24 DIAGNOSIS — E43 Unspecified severe protein-calorie malnutrition: Secondary | ICD-10-CM | POA: Insufficient documentation

## 2019-12-24 DIAGNOSIS — D62 Acute posthemorrhagic anemia: Secondary | ICD-10-CM

## 2019-12-24 DIAGNOSIS — I712 Thoracic aortic aneurysm, without rupture: Secondary | ICD-10-CM | POA: Diagnosis present

## 2019-12-24 DIAGNOSIS — Z95810 Presence of automatic (implantable) cardiac defibrillator: Secondary | ICD-10-CM | POA: Diagnosis not present

## 2019-12-24 DIAGNOSIS — Z8679 Personal history of other diseases of the circulatory system: Secondary | ICD-10-CM | POA: Diagnosis not present

## 2019-12-24 DIAGNOSIS — I5033 Acute on chronic diastolic (congestive) heart failure: Secondary | ICD-10-CM | POA: Diagnosis not present

## 2019-12-24 DIAGNOSIS — I2510 Atherosclerotic heart disease of native coronary artery without angina pectoris: Secondary | ICD-10-CM | POA: Diagnosis present

## 2019-12-24 DIAGNOSIS — Z86010 Personal history of colonic polyps: Secondary | ICD-10-CM

## 2019-12-24 DIAGNOSIS — I701 Atherosclerosis of renal artery: Secondary | ICD-10-CM | POA: Diagnosis present

## 2019-12-24 DIAGNOSIS — Z79899 Other long term (current) drug therapy: Secondary | ICD-10-CM

## 2019-12-24 DIAGNOSIS — Z8774 Personal history of (corrected) congenital malformations of heart and circulatory system: Secondary | ICD-10-CM

## 2019-12-24 DIAGNOSIS — R0602 Shortness of breath: Secondary | ICD-10-CM

## 2019-12-24 DIAGNOSIS — R578 Other shock: Secondary | ICD-10-CM

## 2019-12-24 DIAGNOSIS — I472 Ventricular tachycardia: Secondary | ICD-10-CM | POA: Diagnosis present

## 2019-12-24 DIAGNOSIS — E538 Deficiency of other specified B group vitamins: Secondary | ICD-10-CM | POA: Diagnosis present

## 2019-12-24 DIAGNOSIS — K922 Gastrointestinal hemorrhage, unspecified: Secondary | ICD-10-CM | POA: Diagnosis not present

## 2019-12-24 DIAGNOSIS — Z823 Family history of stroke: Secondary | ICD-10-CM

## 2019-12-24 DIAGNOSIS — E785 Hyperlipidemia, unspecified: Secondary | ICD-10-CM | POA: Diagnosis present

## 2019-12-24 DIAGNOSIS — I4821 Permanent atrial fibrillation: Secondary | ICD-10-CM | POA: Diagnosis present

## 2019-12-24 DIAGNOSIS — Z9842 Cataract extraction status, left eye: Secondary | ICD-10-CM

## 2019-12-24 DIAGNOSIS — Z8673 Personal history of transient ischemic attack (TIA), and cerebral infarction without residual deficits: Secondary | ICD-10-CM

## 2019-12-24 DIAGNOSIS — E1165 Type 2 diabetes mellitus with hyperglycemia: Secondary | ICD-10-CM | POA: Diagnosis present

## 2019-12-24 DIAGNOSIS — Z20822 Contact with and (suspected) exposure to covid-19: Secondary | ICD-10-CM | POA: Diagnosis present

## 2019-12-24 DIAGNOSIS — Z9841 Cataract extraction status, right eye: Secondary | ICD-10-CM

## 2019-12-24 DIAGNOSIS — R0902 Hypoxemia: Secondary | ICD-10-CM

## 2019-12-24 DIAGNOSIS — Z7189 Other specified counseling: Secondary | ICD-10-CM | POA: Diagnosis not present

## 2019-12-24 DIAGNOSIS — E871 Hypo-osmolality and hyponatremia: Secondary | ICD-10-CM | POA: Diagnosis present

## 2019-12-24 DIAGNOSIS — I252 Old myocardial infarction: Secondary | ICD-10-CM

## 2019-12-24 DIAGNOSIS — R52 Pain, unspecified: Secondary | ICD-10-CM | POA: Diagnosis not present

## 2019-12-24 DIAGNOSIS — K639 Disease of intestine, unspecified: Secondary | ICD-10-CM | POA: Diagnosis present

## 2019-12-24 DIAGNOSIS — Z515 Encounter for palliative care: Secondary | ICD-10-CM | POA: Diagnosis not present

## 2019-12-24 DIAGNOSIS — Q2543 Congenital aneurysm of aorta: Secondary | ICD-10-CM | POA: Diagnosis not present

## 2019-12-24 DIAGNOSIS — E876 Hypokalemia: Secondary | ICD-10-CM | POA: Diagnosis present

## 2019-12-24 DIAGNOSIS — Z87891 Personal history of nicotine dependence: Secondary | ICD-10-CM

## 2019-12-24 DIAGNOSIS — L8990 Pressure ulcer of unspecified site, unspecified stage: Secondary | ICD-10-CM | POA: Diagnosis present

## 2019-12-24 DIAGNOSIS — I272 Pulmonary hypertension, unspecified: Secondary | ICD-10-CM | POA: Diagnosis present

## 2019-12-24 DIAGNOSIS — I110 Hypertensive heart disease with heart failure: Secondary | ICD-10-CM | POA: Diagnosis present

## 2019-12-24 DIAGNOSIS — L89151 Pressure ulcer of sacral region, stage 1: Secondary | ICD-10-CM | POA: Diagnosis present

## 2019-12-24 DIAGNOSIS — Z888 Allergy status to other drugs, medicaments and biological substances status: Secondary | ICD-10-CM

## 2019-12-24 DIAGNOSIS — Z85118 Personal history of other malignant neoplasm of bronchus and lung: Secondary | ICD-10-CM

## 2019-12-24 DIAGNOSIS — I700 Atherosclerosis of aorta: Secondary | ICD-10-CM | POA: Diagnosis present

## 2019-12-24 DIAGNOSIS — R1030 Lower abdominal pain, unspecified: Secondary | ICD-10-CM | POA: Diagnosis present

## 2019-12-24 DIAGNOSIS — I509 Heart failure, unspecified: Secondary | ICD-10-CM

## 2019-12-24 DIAGNOSIS — I493 Ventricular premature depolarization: Secondary | ICD-10-CM | POA: Diagnosis present

## 2019-12-24 DIAGNOSIS — Z961 Presence of intraocular lens: Secondary | ICD-10-CM | POA: Diagnosis present

## 2019-12-24 DIAGNOSIS — R531 Weakness: Secondary | ICD-10-CM

## 2019-12-24 DIAGNOSIS — Z953 Presence of xenogenic heart valve: Secondary | ICD-10-CM

## 2019-12-24 DIAGNOSIS — F410 Panic disorder [episodic paroxysmal anxiety] without agoraphobia: Secondary | ICD-10-CM | POA: Diagnosis present

## 2019-12-24 LAB — GLUCOSE, CAPILLARY
Glucose-Capillary: 141 mg/dL — ABNORMAL HIGH (ref 70–99)
Glucose-Capillary: 163 mg/dL — ABNORMAL HIGH (ref 70–99)
Glucose-Capillary: 110 mg/dL — ABNORMAL HIGH (ref 70–99)
Glucose-Capillary: 118 mg/dL — ABNORMAL HIGH (ref 70–99)

## 2019-12-24 LAB — COMPREHENSIVE METABOLIC PANEL
ALT: 8 U/L (ref 0–44)
AST: 11 U/L — ABNORMAL LOW (ref 15–41)
Albumin: 2.5 g/dL — ABNORMAL LOW (ref 3.5–5.0)
Alkaline Phosphatase: 53 U/L (ref 38–126)
Anion gap: 7 (ref 5–15)
BUN: 33 mg/dL — ABNORMAL HIGH (ref 8–23)
CO2: 26 mmol/L (ref 22–32)
Calcium: 6.9 mg/dL — ABNORMAL LOW (ref 8.9–10.3)
Chloride: 104 mmol/L (ref 98–111)
Creatinine, Ser: 0.65 mg/dL (ref 0.61–1.24)
GFR calc Af Amer: >60 mL/min (ref >60)
GFR calc non Af Amer: >60 mL/min (ref >60)
Glucose, Bld: 186 mg/dL — ABNORMAL HIGH (ref 70–99)
Potassium: 3.6 mmol/L (ref 3.5–5.1)
Sodium: 137 mmol/L (ref 135–145)
Total Bilirubin: 0.5 mg/dL (ref 0.3–1.2)
Total Protein: 4.7 g/dL — ABNORMAL LOW (ref 6.5–8.1)

## 2019-12-24 LAB — CBC WITH DIFFERENTIAL/PLATELET
Abs Immature Granulocytes: 0.07 10*3/uL (ref 0.00–0.07)
Basophils Absolute: 0.1 10*3/uL (ref 0.0–0.1)
Basophils Relative: 1 %
Eosinophils Absolute: 0.1 10*3/uL (ref 0.0–0.5)
Eosinophils Relative: 1 %
HCT: 22.5 % — ABNORMAL LOW (ref 39.0–52.0)
Hemoglobin: 6.5 g/dL — CL (ref 13.0–17.0)
Immature Granulocytes: 1 %
Lymphocytes Relative: 5 %
Lymphs Abs: 0.4 10*3/uL — ABNORMAL LOW (ref 0.7–4.0)
MCH: 24.2 pg — ABNORMAL LOW (ref 26.0–34.0)
MCHC: 28.9 g/dL — ABNORMAL LOW (ref 30.0–36.0)
MCV: 83.6 fL (ref 80.0–100.0)
Monocytes Absolute: 0.6 10*3/uL (ref 0.1–1.0)
Monocytes Relative: 8 %
Neutro Abs: 6.7 10*3/uL (ref 1.7–7.7)
Neutrophils Relative %: 84 %
Platelets: 177 10*3/uL (ref 150–400)
RBC: 2.69 MIL/uL — ABNORMAL LOW (ref 4.22–5.81)
RDW: 23 % — ABNORMAL HIGH (ref 11.5–15.5)
WBC: 7.9 10*3/uL (ref 4.0–10.5)
nRBC: 0 % (ref 0.0–0.2)

## 2019-12-24 LAB — LIPASE, BLOOD: Lipase: 25 U/L (ref 11–51)

## 2019-12-24 LAB — DIGOXIN LEVEL: Digoxin Level: 0.5 ng/mL — ABNORMAL LOW (ref 0.8–2.0)

## 2019-12-24 LAB — PROTIME-INR
INR: 1.2 (ref 0.8–1.2)
Prothrombin Time: 14.5 seconds (ref 11.4–15.2)

## 2019-12-24 LAB — HEMOGLOBIN AND HEMATOCRIT, BLOOD
HCT: 26.6 % — ABNORMAL LOW (ref 39.0–52.0)
HCT: 25.4 % — ABNORMAL LOW (ref 39.0–52.0)
Hemoglobin: 8.1 g/dL — ABNORMAL LOW (ref 13.0–17.0)
Hemoglobin: 7.6 g/dL — ABNORMAL LOW (ref 13.0–17.0)

## 2019-12-24 LAB — LACTIC ACID, PLASMA: Lactic Acid, Venous: 2.6 mmol/L (ref 0.5–1.9)

## 2019-12-24 LAB — PREPARE RBC (CROSSMATCH)

## 2019-12-24 LAB — CBG MONITORING, ED
Glucose-Capillary: 204 mg/dL — ABNORMAL HIGH (ref 70–99)
Glucose-Capillary: 169 mg/dL — ABNORMAL HIGH (ref 70–99)

## 2019-12-24 LAB — MRSA PCR SCREENING: MRSA by PCR: NEGATIVE

## 2019-12-24 LAB — POC OCCULT BLOOD, ED: Fecal Occult Bld: POSITIVE — AB

## 2019-12-24 LAB — SARS CORONAVIRUS 2 BY RT PCR (HOSPITAL ORDER, PERFORMED IN ~~LOC~~ HOSPITAL LAB): SARS Coronavirus 2: NEGATIVE

## 2019-12-24 MED ORDER — ALBUTEROL SULFATE (2.5 MG/3ML) 0.083% IN NEBU: 2.5000 mg | INHALATION_SOLUTION | RESPIRATORY_TRACT | Status: DC | PRN

## 2019-12-24 MED ORDER — SODIUM CHLORIDE (PF) 0.9 % IJ SOLN
INTRAMUSCULAR | Status: AC
  Filled 2019-12-24: qty 50

## 2019-12-24 MED ORDER — INSULIN ASPART 100 UNIT/ML ~~LOC~~ SOLN
0.0000 [IU] | SUBCUTANEOUS | Status: DC
  Administered 2019-12-24: 1 [IU] via SUBCUTANEOUS
  Administered 2019-12-24 (×2): 2 [IU] via SUBCUTANEOUS
  Administered 2019-12-25: 1 [IU] via SUBCUTANEOUS
  Filled 2019-12-24: qty 0.09

## 2019-12-24 MED ORDER — SODIUM CHLORIDE 0.9% IV SOLUTION: Freq: Once | INTRAVENOUS | Status: AC

## 2019-12-24 MED ORDER — CHLORHEXIDINE GLUCONATE CLOTH 2 % EX PADS
6.0000 | MEDICATED_PAD | Freq: Every day | CUTANEOUS | Status: DC
  Administered 2019-12-24 – 2020-01-15 (×23): 6 via TOPICAL

## 2019-12-24 MED ORDER — SODIUM CHLORIDE 0.9 % IV BOLUS
500.0000 mL | Freq: Once | INTRAVENOUS | Status: AC
  Administered 2019-12-24: 500 mL via INTRAVENOUS

## 2019-12-24 MED ORDER — ORAL CARE MOUTH RINSE
15.0000 mL | Freq: Two times a day (BID) | OROMUCOSAL | Status: DC
  Administered 2019-12-24 – 2020-01-15 (×42): 15 mL via OROMUCOSAL

## 2019-12-24 MED ORDER — IOHEXOL 350 MG/ML SOLN
100.0000 mL | Freq: Once | INTRAVENOUS | Status: AC | PRN
  Administered 2019-12-24: 100 mL via INTRAVENOUS

## 2019-12-24 MED ORDER — SODIUM CHLORIDE 0.9 % IV SOLN
8.0000 mg/h | INTRAVENOUS | Status: DC
  Administered 2019-12-24 – 2019-12-25 (×4): 8 mg/h via INTRAVENOUS
  Filled 2019-12-24 (×5): qty 80

## 2019-12-24 MED ORDER — DIGOXIN 0.25 MG/ML IJ SOLN
0.1250 mg | Freq: Every day | INTRAMUSCULAR | Status: DC
  Administered 2019-12-24: 0.125 mg via INTRAVENOUS
  Filled 2019-12-24: qty 0.5

## 2019-12-24 MED ORDER — LACTATED RINGERS IV SOLN: INTRAVENOUS | Status: DC

## 2019-12-24 MED ORDER — SODIUM CHLORIDE 0.9 % IV SOLN
80.0000 mg | Freq: Once | INTRAVENOUS | Status: AC
  Administered 2019-12-24: 80 mg via INTRAVENOUS
  Filled 2019-12-24: qty 80

## 2019-12-24 MED ORDER — IPRATROPIUM-ALBUTEROL 0.5-2.5 (3) MG/3ML IN SOLN
3.0000 mL | Freq: Three times a day (TID) | RESPIRATORY_TRACT | Status: DC
  Administered 2019-12-24 – 2020-01-15 (×67): 3 mL via RESPIRATORY_TRACT
  Filled 2019-12-24 (×68): qty 3

## 2019-12-24 MED ORDER — ONDANSETRON HCL 4 MG/2ML IJ SOLN: 4.0000 mg | Freq: Once | INTRAMUSCULAR | Status: DC

## 2019-12-24 NOTE — H&P (Signed)
NAME:  [name], MRN:  [id number], DOB:  March 28, 1946, LOS: 0 ADMISSION DATE:  12/24/2019, CONSULTATION DATE:  12/24/2019 REFERRING MD:  Dr. Wyvonnia Dusky, ER, CHIEF COMPLAINT:  Lower GI bleeding   Brief History   74 yo male presented from rehab center with abdominal pain and maroon colored stool.  PCCM asked to admit to ICU.  He had CT abd/pelvis 12/06/19 that showed possible cecal mass.  History of present illness   Denies chest pain, dyspnea.  Has nausea but no vomiting.  Feels weak.  Never had this before.  He is DNR but agreeable to other medical therapies.  Past Medical History  Combined CHF with EF 25%, s/p AICD, Severe COPD/emphysema, chronic respiratory failure on 5 liters, Permanent A fib on ASA, ICH, mitral regurg s/p bioprosthetic MVR, NSCLC s/p wedge resection, DM type 2, HTN, HLD  Significant Hospital Events   6/15 Admit  Consults:  Gastroenterology  Procedures:    Significant Diagnostic Tests:  CT angio chest/abd/pelvis 12/06/19 >> severe centrilobular and paraseptal emphysema, mod b/l effusions, LUL wedge resection, changes of liver cirrhosis, mass like appearance in cecum CT angio abd/pelvis 12/24/19 >> no source of GI bleeding identified  Micro Data:  SARS CoV2 6/15 >> negative  Antimicrobials:     Interim history/subjective:    Objective   Blood pressure (!) 108/58, pulse 72, temperature (!) 97.5 F (36.4 C), temperature source Oral, resp. rate 15, SpO2 96 %.        Intake/Output Summary (Last 24 hours) at 12/24/2019 0836 Last data filed at 12/24/2019 2094 Gross per 24 hour  Intake 100 ml  Output --  Net 100 ml   There were no vitals filed for this visit.  Examination:  General - cachectic Eyes - pupils reactive ENT - no sinus tenderness, no stridor Cardiac - regular rate/rhythm, no murmur Chest - decreased BS, no wheeze/rales Abdomen - soft, mild tenderness in Lt and Rt lower quadrants, no rebound/guarding, decreased bowel sounds Extremities -  decreased muscle bulk Skin - no rashes Neuro - normal strength, moves extremities, follows commands Psych - normal mood and behavior  Resolved Hospital Problem list     Assessment & Plan:   Lower GI bleeding with concern for cecal mass noted on CT abd/pelvis 12/06/19. - GI consulted - f/u Hb and transfuse if < 7 or significant bleeding  Chronic combined CHF, permanent A fib, WHO group 2 and 3 pulmonary HTN, HLD. - hold outpt ASA, lipitor, lasix - digoxin 0.125 mg IV  Severe COPD/emphysema. Chronic hypoxic respiratory failure. - scheduled duoneb - goal SpO2 88 to 95% - hold outpt breztri  Moderate protein calorie malnutrition. - will need assessment by nutrition  DM type II poorly controlled with hyperglycemia. - SSI - hold metformin  Hx of Depression. - hold outpt celexa  Goals of care. - DNR  Best practice:  Diet: NPO DVT prophylaxis: SCDs GI prophylaxis: Protonix Mobility: bed rest Code Status: DNR Disposition: ICU  Labs   CBC: Recent Labs  Lab 12/24/19 0525  WBC 7.9  NEUTROABS 6.7  HGB 6.5*  HCT 22.5*  MCV 83.6  PLT 709    Basic Metabolic Panel: Recent Labs  Lab 12/24/19 0525  NA 137  K 3.6  CL 104  CO2 26  GLUCOSE 186*  BUN 33*  CREATININE 0.65  CALCIUM 6.9*   GFR: Estimated Creatinine Clearance: 80.9 mL/min (by C-G formula based on SCr of 0.65 mg/dL). Recent Labs  Lab 12/24/19 0525 12/24/19 0547  WBC  7.9  --   LATICACIDVEN  --  2.6*    Liver Function Tests: Recent Labs  Lab 12/24/19 0525  AST 11*  ALT 8  ALKPHOS 53  BILITOT 0.5  PROT 4.7*  ALBUMIN 2.5*   Recent Labs  Lab 12/24/19 0525  LIPASE 25   No results for input(s): AMMONIA in the last 168 hours.  ABG    Component Value Date/Time   PHART 7.414 11/30/2013 0439   PCO2ART 49.2 (H) 11/30/2013 0439   PO2ART 56.0 (L) 11/30/2013 0439   HCO3 31.7 (H) 11/30/2013 0439   TCO2 33 11/30/2013 0439   O2SAT 89.0 11/30/2013 0439     Coagulation Profile: Recent  Labs  Lab 12/24/19 0525  INR 1.2    Cardiac Enzymes: No results for input(s): CKTOTAL, CKMB, CKMBINDEX, TROPONINI in the last 168 hours.  HbA1C: Hgb A1c MFr Bld  Date/Time Value Ref Range Status  11/14/2019 04:53 AM 7.4 (H) 4.8 - 5.6 % Final    Comment:    (NOTE) Pre diabetes:          5.7%-6.4% Diabetes:              >6.4% Glycemic control for   <7.0% adults with diabetes   04/12/2019 09:01 AM 7.5 (H) 4.6 - 6.5 % Final    Comment:    Glycemic Control Guidelines for People with Diabetes:Non Diabetic:  <6%Goal of Therapy: <7%Additional Action Suggested:  >8%     CBG: Recent Labs  Lab 12/24/19 0552  GLUCAP 204*    Review of Systems:   Reviewed and negative  Past Medical History  He,  has a past medical history of Atrial septal defect, Automatic implantable cardioverter-defibrillator in situ, Chronic combined systolic and diastolic CHF (congestive heart failure) ([place]), Colon polyps, COPD (chronic obstructive pulmonary disease) ([place]), CVA (cerebral vascular accident) ([place]) (2009), Diabetes mellitus without complication ([place]), Dyslipidemia, Endocarditis, Hypertension, Intracranial hemorrhage ([place]), Lung cancer ([place]) (11/29/2013), Myocardial infarction ([place]) (2010), Pacemaker, Permanent atrial fibrillation, Pneumonia (07/2018), Pulmonary hypertension ([place]), Renal artery stenosis (HCC), 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   Surgical History    Past Surgical History:  Procedure Laterality Date  . APPENDECTOMY    . ASD REPAIR, SECUNDUM  07/17/2008   pericardial patch closure of ASD  . AV NODE ABLATION  07/2008   for rapid atrial fib  . CARDIAC CATHETERIZATION    . CARDIAC DEFIBRILLATOR PLACEMENT  ~ [address] [place]  . CARDIAC VALVE REPLACEMENT    . CATARACT EXTRACTION W/ INTRAOCULAR LENS  IMPLANT, BILATERAL Bilateral   .  ESOPHAGOGASTRODUODENOSCOPY (EGD) WITH PROPOFOL N/A 09/29/2016   Procedure: ESOPHAGOGASTRODUODENOSCOPY (EGD) WITH PROPOFOL;  Surgeon: [name], MD;  Location: [facility] ENDOSCOPY;  Service: Endoscopy;  Laterality: N/A;  . HERNIA REPAIR    . IMPLANTABLE CARDIOVERTER DEFIBRILLATOR (ICD) GENERATOR CHANGE N/A 02/06/2012   Procedure: ICD GENERATOR CHANGE;  Surgeon: [name], MD;  Location[facility] C[facility] LAB;  Service: Cardiovascular;  Laterality: N/A;  . INSERT / REPLACE / REMOVE PACEMAKER    . MASS BIOPSY Left    neck mass  . MITRAL VALVE REPLACEMENT Right 07/17/2008   7mm Medtronic Mosaic porcine bioprosthesis  . PENILE PROSTHESIS IMPLANT    . RIGHT HEART CATHETERIZATION N/A 08/16/2013   Procedure: RIGHT HEART CATH;  Surgeon: Josue Hector, MD;  Location: [facility] CATH LAB;  Service: Cardiovascular;  Laterality: N/A;  . TEE  WITHOUT CARDIOVERSION N/A 09/06/2016   Procedure: TRANSESOPHAGEAL ECHOCARDIOGRAM (TEE);  Surgeon: Dorothy Spark, MD;  Location: [facility] ENDOSCOPY;  Service: Cardiovascular;  Laterality: N/A;  . THORACOTOMY Right 1970's   spontaneous pneumothorax - while in the [place]  . TOE SURGERY     left foot hammer toe  . TONSILLECTOMY    . VIDEO ASSISTED THORACOSCOPY (VATS)/WEDGE RESECTION Left 11/29/2013   Procedure: Video assisted thoracoscopy for wedge resection; mini thoracotomy;  Surgeon: [name], MD;  Location: [place];  Service: Thoracic;  Laterality: Left;     Social History   reports that he quit smoking about 10 years ago. His smoking use included cigarettes. He has a 45.00 pack-year smoking history. He has never used smokeless tobacco. He reports that he does not drink alcohol and does not use drugs.   Family History   His family history includes Stomach cancer in his father; Stroke in his mother.   Allergies Allergies  Allergen Reactions  . Anticoagulant Compound Other (See Comments)    Pt had intracranial bleed, therefore all anticoagulation is contraindicated per Dr.  Ron Parker  . Other Other (See Comments)    Per Dr. Halford Chessman (Surgeon): stated that the patient cannot be put under for any surgery, as he has an enlarged aorta. He would stand only a 50/50 chance of surviving. He has lung issues, diminished lung tissue, COPD, and emphysema.  . Warfarin Sodium Other (See Comments)    Pt had intracranial bleed, therefore all anticoagulation is contraindicated per Dr. Ron Parker     Home Medications  Prior to Admission medications   Medication Sig Start Date End Date Taking? Authorizing Provider  acetaminophen (TYLENOL) 500 MG tablet Take 2 tablets (1,000 mg total) by mouth every 8 (eight) hours. 11/25/19 01/24/20 Yes Mercy Riding, MD  aspirin EC 81 MG tablet Take 1 tablet (81 mg total) by mouth daily. 12/07/12  Yes Carlena Bjornstad, MD  atorvastatin (LIPITOR) 10 MG tablet Take 10 mg by mouth daily.   Yes [provider]  bethanechol (URECHOLINE) 10 MG tablet Take 1 tablet (10 mg total) by mouth 3 (three) times daily. 12/03/19  Yes Nafziger, Tommi Rumps, NP  Budeson-Glycopyrrol-Formoterol (BREZTRI AEROSPHERE) 160-9-4.8 MCG/ACT AERO Inhale 2 puffs into the lungs in the morning and at bedtime. 11/08/19  Yes Martyn Ehrich, NP  citalopram (CELEXA) 10 MG tablet Take 1 tablet (10 mg total) by mouth daily. 12/12/19  Yes Antonieta Pert, MD  digoxin (LANOXIN) 0.125 MG tablet Take 1 tablet (0.125 mg total) by mouth daily. 12/12/19  Yes Antonieta Pert, MD  furosemide (LASIX) 80 MG tablet Take 1 tablet (80 mg total) by mouth 2 (two) times daily. 12/12/19  Yes Antonieta Pert, MD  levalbuterol (XOPENEX) 0.63 MG/3ML nebulizer solution Take 3 mLs (0.63 mg total) by nebulization 2 (two) times daily. 12/12/19  Yes Antonieta Pert, MD  metFORMIN (GLUCOPHAGE) 1000 MG tablet TAKE 1 TABLET BY MOUTH TWICE A DAY WITH MEALS Patient taking differently: Take 1,000 mg by mouth 2 (two) times daily with a meal.  12/05/19  Yes Nafziger, Tommi Rumps, NP  nitroGLYCERIN (NITROSTAT) 0.4 MG SL tablet Place 0.4 mg under the tongue every  5 (five) minutes x 3 doses as needed for chest pain.    Yes [provider]  pantoprazole (PROTONIX) 40 MG tablet Take 1 tablet (40 mg total) by mouth daily at 6 (six) AM. 12/12/19  Yes Kc, Ramesh, MD  potassium chloride SA (KLOR-CON) 20 MEQ tablet Take 2 tablets (40 mEq total)  by mouth daily. 08/12/19 12/24/19 Yes Nafziger, Tommi Rumps, NP  tamsulosin (FLOMAX) 0.4 MG CAPS capsule Take 1 capsule (0.4 mg total) by mouth daily after supper. 12/03/19  Yes Nafziger, Tommi Rumps, NP  vitamin B-12 1000 MCG tablet Take 1 tablet (1,000 mcg total) by mouth daily. 11/25/19  Yes Mercy Riding, MD     Critical care time: 36 minutes  Chesley Mires, MD [place] Pager - [phone] 12/24/2019, 8:41 AM

## 2019-12-24 NOTE — ED Notes (Signed)
To CT

## 2019-12-24 NOTE — ED Notes (Signed)
Date and time results received: 12/24/19 6:29 AM    Test: HGB Critical Value: 6.5  Name of Provider Notified: Dr Wyvonnia Dusky

## 2019-12-24 NOTE — ED Provider Notes (Signed)
[facility] DEPT Provider Note   CSN: [id number] Arrival date & time: 12/24/19  0515     History No chief complaint on file.   [name] is a 74 y.o. male.  74 y.o. male with medical history significant for end-stage chronic combined systolic and diastolic CHF (EF 68-34%) s/p BiV ICD, chronic severe COPD/emphysema with chronic respiratory failure on 5 L of home O2 via [place], permanent A. fib on aspirin alone (Hx of ICH while on Coumadin) s/p AV nodal ablation, severe mitral regurgitation s/p bioprosthetic mitral valve replacement, pulmonary hypertension, non-small cell left lung cancer s/p wedge resection, type 2 diabetes, hypertension, and hyperlipidemia presenting from his rehab facility with rectal bleeding that onset last night.  Reports 3-4 episodes of grossly bloody maroon stools.  Complains of lower abdominal pain that is recurrent issue for him ongoing across his lower abdomen.  He has had nausea but no vomiting.  No fever.  No chest pain or shortness of breath.  Recent hospitalization for CHF and COPD.  Does have DNR in place.  Notably was found to have a cecal mass on CT scan done 2 weeks ago which patient and family were not aware of. He was thought not to be a candidate for colonoscopy due to his comorbidities.  The history is provided by the patient, the EMS personnel and a relative. The history is limited by the condition of the patient.       Past Medical History:  Diagnosis Date  . Atrial septal defect    Closed with surgery January, 2010  . Automatic implantable cardioverter-defibrillator in situ    LV dysfunction and pacer needed for AV node lesion  . Chronic combined systolic and diastolic CHF (congestive heart failure) ([place])   . Colon polyps   . COPD (chronic obstructive pulmonary disease) (HCC)    O2- 2 liters, nasal cannula, q night   . CVA (cerebral vascular accident) ([place]) 2009   denies residual on 08/14/2013  . Diabetes  mellitus without complication ([place])    type 2  . Dyslipidemia   . Endocarditis    Bacterial, 2009  . Hypertension   . Intracranial hemorrhage (HCC)    Coumadin cannot be used because of the history of his bleed  . Lung cancer ([place]) 11/29/2013   T1N0 Stage Ia non-small cell carcinoma left lung treated with wedge resection  . Myocardial infarction ([place]) 2010  . Pacemaker    combo pacer and icd  . Permanent atrial fibrillation    Originally Coumadin use for atrial fibrillation  //   he had intracerebral hemorrhage with an INR of 2.3 June, 2009. Anticoagulation could no longer be used.  //  Rapid atrial fibrillation after inferior MI October, 2010..........Marland Kitchen AV node ablation done at that time with ICD pacemaker placed (EF 35%).   //   Left atrial appendage tied off at the time of mitral valve surgery January, 2010 (maze pro  . Pneumonia 07/2018  . Pulmonary hypertension (HCC)    Moderate  . Renal artery stenosis (HCC)    Mild by history  . 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 Spontaneous pneumothorax    right thoracotomy - distant past  . Status post minimally invasive mitral valve replacement with bioprosthetic valve    33 mm Medtronic Mosaic porcine bioprosthesis placed via right mini thoracotomy for bacterial endocarditis complicated by severe MR and CHF   . Thoracic aortic aneurysm ([place]) 08/11/2016   a - Chest CTA 1/18:  Aneurysmal dilatation of aortic root is noted at 5.1 cm.     Patient Active Problem List   Diagnosis Date Noted  . Pressure injury of skin 12/08/2019  . Depression, recurrent ([place])   . Acute respiratory failure with hypoxia ([place])   . Goals of care, counseling/discussion   . Palliative care by specialist   . Acute exacerbation of CHF (congestive heart failure) ([place]) 11/13/2019  . Elevated troponin   . Chronic atrial fibrillation ([place])   . Weakness generalized   . DNR (do not resuscitate)   . Acute on chronic combined systolic (congestive) and diastolic  (congestive) heart failure ([place]) 04/27/2019  . COPD with acute exacerbation ([place]) 08/01/2018  . Pulmonary edema 07/30/2018  . Health care maintenance 05/09/2018  . Gastritis and gastroduodenitis   . Diverticulum of duodenum   . Aortic root dilatation ([place])   . 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 Atherosclerosis of aorta ([place]) 08/11/2016  . Orthostatic hypotension 09/26/2015  . Near syncope 09/24/2015  . Syncope 09/24/2015  . Hepatic cirrhosis ([place]) 09/25/2014  . History of colonic polyps 09/25/2014  . Serum total bilirubin elevated 06/30/2014  . Follow-up examination, following unspecified surgery 01/27/2014  . Lung cancer ([place]) 11/29/2013  . Prosthetic valve dysfunction   . Chronic combined systolic and diastolic CHF (congestive heart failure) ([place])   . Type 2 diabetes mellitus ([place]) 11/04/2013  . Myocardial infarction ([place]) 11/01/2013  . Cardiomyopathy, nonischemic ([place]) 11/01/2013  . Acute on chronic respiratory failure with hypoxia ([place]) 08/15/2013  . H/O intracranial hemorrhage 08/13/2013  . H/O endocarditis 08/13/2013  . H/O: CVA (cerebrovascular accident) 08/13/2013  . Community acquired pneumonia 08/13/2013  . Chronic respiratory failure assoc with chf/ PAH 08/12/2013  . H/O atrioventricular nodal ablation   . S/P MVR (mitral valve replacement)   . Hypertension associated with diabetes ([place])   . Hyperlipidemia associated with type 2 diabetes mellitus ([place])   . Permanent atrial fibrillation   . Ejection fraction < 50%   . ICD (implantable cardioverter-defibrillator), biventricular, in situ   . Renal artery stenosis ([place])   . Pulmonary hypertension ([place])   . Primary cancer of left upper lobe of lung ([place]) 04/20/2009  . COLONIC POLYPS, ADENOMATOUS 03/22/2007  . COPD GOLD II 01/11/2007  . GERD 01/11/2007    Past Surgical History:  Procedure Laterality Date  . APPENDECTOMY    . ASD REPAIR, SECUNDUM  07/17/2008   pericardial patch closure of ASD  . AV NODE  ABLATION  07/2008   for rapid atrial fib  . CARDIAC CATHETERIZATION    . CARDIAC DEFIBRILLATOR PLACEMENT  ~ [address] [place]  . CARDIAC VALVE REPLACEMENT    . CATARACT EXTRACTION W/ INTRAOCULAR LENS  IMPLANT, BILATERAL Bilateral   . ESOPHAGOGASTRODUODENOSCOPY (EGD) WITH PROPOFOL N/A 09/29/2016   Procedure: ESOPHAGOGASTRODUODENOSCOPY (EGD) WITH PROPOFOL;  Surgeon: [name], MD;  Location: [facility] ENDOSCOPY;  Service: Endoscopy;  Laterality: N/A;  . HERNIA REPAIR    . IMPLANTABLE CARDIOVERTER DEFIBRILLATOR (ICD) GENERATOR CHANGE N/A 02/06/2012   Procedure: ICD GENERATOR CHANGE;  Surgeon: [name], MD;  Location: [facility] C[facility] LAB;  Service: Cardiovascular;  Laterality: N/A;  . INSERT / REPLACE / REMOVE PACEMAKER    . MASS BIOPSY Left    neck mass  . MITRAL VALVE REPLACEMENT Right 07/17/2008   54mm Medtronic Mosaic porcine bioprosthesis  . PENILE PROSTHESIS IMPLANT    . RIGHT HEART CATHETERIZATION N/A 08/16/2013   Procedure: RIGHT HEART CATH;  Surgeon: Josue Hector,  MD;  Location: [facility] CATH LAB;  Service: Cardiovascular;  Laterality: N/A;  . TEE WITHOUT CARDIOVERSION N/A 09/06/2016   Procedure: TRANSESOPHAGEAL ECHOCARDIOGRAM (TEE);  Surgeon: Dorothy Spark, MD;  Location[facility] END[facility]COPY;  Service: Cardiovascular;  Laterality: N/A;  . THORACOTOMY Right 1970's   spontaneous pneumothorax - while in the [place]  . TOE SURGERY     left foot hammer toe  . TONSILLECTOMY    . VIDEO ASSISTED THORACOSCOPY (VATS)/WEDGE RESECTION Left 11/29/2013   Procedure: Video assisted thoracoscopy for wedge resection; mini thoracotomy;  Surgeon: [name], MD;  Location: [facility] OR;  Service: Thoracic;  Laterality: Left;       Family History  Problem Relation Age of Onset  . Stomach cancer Father   . Stroke Mother     Social History   Tobacco Use  . Smoking status: Former Smoker    Packs/day: 1.00    Years: 45.00    Pack years: 45.00    Types: Cigarettes    Quit date: 2011    Years since quitting:  10.4  . Smokeless tobacco: Never Used  Vaping Use  . Vaping Use: Never used  Substance Use Topics  . Alcohol use: No    Alcohol/week: 0.0 standard drinks    Comment: 08/14/2013 "used to drink beer; quit:in 1982"  . Drug use: No    Home Medications Prior to Admission medications   Medication Sig Start Date End Date Taking? Authorizing Provider  acetaminophen (TYLENOL) 500 MG tablet Take 2 tablets (1,000 mg total) by mouth every 8 (eight) hours. 11/25/19 01/24/20  Mercy Riding, MD  aspirin EC 81 MG tablet Take 1 tablet (81 mg total) by mouth daily. 12/07/12   Carlena Bjornstad, MD  atorvastatin (LIPITOR) 10 MG tablet Take 10 mg by mouth daily.    [provider]  bethanechol (URECHOLINE) 10 MG tablet Take 1 tablet (10 mg total) by mouth 3 (three) times daily. 12/03/19   Nafziger, Tommi Rumps, NP  Budeson-Glycopyrrol-Formoterol (BREZTRI AEROSPHERE) 160-9-4.8 MCG/ACT AERO Inhale 2 puffs into the lungs in the morning and at bedtime. 11/08/19   Martyn Ehrich, NP  citalopram (CELEXA) 10 MG tablet Take 1 tablet (10 mg total) by mouth daily. 12/12/19   Antonieta Pert, MD  digoxin (LANOXIN) 0.125 MG tablet Take 1 tablet (0.125 mg total) by mouth daily. 12/12/19   Antonieta Pert, MD  furosemide (LASIX) 80 MG tablet Take 1 tablet (80 mg total) by mouth 2 (two) times daily. 12/12/19   Antonieta Pert, MD  levalbuterol (XOPENEX) 0.63 MG/3ML nebulizer solution Take 3 mLs (0.63 mg total) by nebulization 2 (two) times daily. 12/12/19   Antonieta Pert, MD  metFORMIN (GLUCOPHAGE) 1000 MG tablet TAKE 1 TABLET BY MOUTH TWICE A DAY WITH MEALS 12/05/19   Nafziger, Tommi Rumps, NP  nitroGLYCERIN (NITROSTAT) 0.4 MG SL tablet Place 0.4 mg under the tongue every 5 (five) minutes x 3 doses as needed for chest pain.     [provider]  pantoprazole (PROTONIX) 40 MG tablet Take 1 tablet (40 mg total) by mouth daily at 6 (six) AM. 12/12/19   Antonieta Pert, MD  potassium chloride SA (KLOR-CON) 20 MEQ tablet Take 2 tablets (40 mEq total) by mouth  daily. 08/12/19 11/13/19  Nafziger, Tommi Rumps, NP  tamsulosin (FLOMAX) 0.4 MG CAPS capsule Take 1 capsule (0.4 mg total) by mouth daily after supper. 12/03/19   Nafziger, Tommi Rumps, NP  vitamin B-12 1000 MCG tablet Take 1 tablet (1,000 mcg total) by mouth daily. 11/25/19  Mercy Riding, MD    Allergies    Anticoagulant compound, Other, and Warfarin sodium  Review of Systems   Review of Systems  Physical Exam Updated Vital Signs BP 111/61   Pulse 73   Temp (!) 97.5 F (36.4 C) (Oral)   Resp 18   SpO2 100%   Physical Exam Vitals and nursing note reviewed.  Constitutional:      General: He is not in acute distress.    Appearance: He is well-developed. He is ill-appearing.     Comments: Cachectic, chronically ill-appearing  HENT:     Head: Normocephalic and atraumatic.     Mouth/Throat:     Mouth: Mucous membranes are dry.     Pharynx: No oropharyngeal exudate.  Eyes:     Conjunctiva/sclera: Conjunctivae normal.     Pupils: Pupils are equal, round, and reactive to light.  Neck:     Comments: No meningismus. Cardiovascular:     Rate and Rhythm: Normal rate and regular rhythm.     Heart sounds: Normal heart sounds. No murmur heard.      Comments: Equal femoral pulses Pacemaker in place Pulmonary:     Effort: Respiratory distress present.     Breath sounds: Normal breath sounds.     Comments: Dyspnea with conversation, diminished breath sound Abdominal:     Palpations: Abdomen is soft.     Tenderness: There is abdominal tenderness. There is no guarding or rebound.     Comments: Diffuse tenderness worse periumbilically No guarding or rebound  Genitourinary:    Comments: Maroon stool on rectal exam Musculoskeletal:        General: No tenderness. Normal range of motion.     Cervical back: Normal range of motion and neck supple.  Skin:    General: Skin is warm.  Neurological:     Mental Status: He is alert.     Cranial Nerves: No cranial nerve deficit.     Motor: No abnormal muscle  tone.     Coordination: Coordination normal.     Comments: Oriented to person and place, follows commands, moves all extremities  Psychiatric:        Behavior: Behavior normal.     ED Results / Procedures / Treatments   Labs (all labs ordered are listed, but only abnormal results are displayed) Labs Reviewed  COMPREHENSIVE METABOLIC PANEL - Abnormal; Notable for the following components:      Result Value   Glucose, Bld 186 (*)    BUN 33 (*)    Calcium 6.9 (*)    Total Protein 4.7 (*)    Albumin 2.5 (*)    AST 11 (*)    All other components within normal limits  POC OCCULT BLOOD, ED - Abnormal; Notable for the following components:   Fecal Occult Bld POSITIVE (*)    All other components within normal limits  CBG MONITORING, ED - Abnormal; Notable for the following components:   Glucose-Capillary 204 (*)    All other components within normal limits  SARS CORONAVIRUS 2 BY RT PCR (HOSPITAL ORDER, [facility])  PROTIME-INR  LIPASE, BLOOD  CBC WITH DIFFERENTIAL/PLATELET  DIGOXIN LEVEL  LACTIC ACID, PLASMA  TYPE AND SCREEN    EKG EKG Interpretation  Date/Time:  Tuesday December 24 2019 05:19:37 EDT Ventricular Rate:  74 PR Interval:    QRS Duration: 139 QT Interval:  449 QTC Calculation: 499 R Axis:   -109 Text Interpretation: Sinus rhythm Right bundle branch block  Inferior infarct, old Lateral leads are also involved No significant change was found Confirmed by Ezequiel Essex [phone]) on 12/24/2019 5:55:40 AM   Radiology DG Chest Port 1 View  Result Date: 12/24/2019 CLINICAL DATA:  Weakness EXAM: PORTABLE CHEST 1 VIEW COMPARISON:  Dec 06, 2019 FINDINGS: The heart size and mediastinal contours are stable. A right-sided pacemaker seen with the lead tips in the right atrium right ventricle. Again noted is bilateral hilar adenopathy. Streaky atelectasis seen at the right lung base. Surgical suture at the left upper lung. No large airspace  consolidation. No acute osseous abnormality. IMPRESSION: No active disease. Electronically Signed   By: Prudencio Pair M.D.   On: 12/24/2019 06:17   DG Abd Portable 2 Views  Result Date: 12/24/2019 CLINICAL DATA:  Weakness EXAM: PORTABLE ABDOMEN - 2 VIEW COMPARISON:  None. FINDINGS: The bowel gas pattern is normal. There is no evidence of free air. No radio-opaque calculi or other significant radiographic abnormality is seen. IMPRESSION: Negative. Electronically Signed   By: Prudencio Pair M.D.   On: 12/24/2019 06:17    Procedures .Critical Care Performed by: Ezequiel Essex, MD Authorized by: Ezequiel Essex, MD   Critical care provider statement:    Critical care time (minutes):  60   Critical care was necessary to treat or prevent imminent or life-threatening deterioration of the following conditions:  Shock (GI bleed)   Critical care was time spent personally by me on the following activities:  Discussions with consultants, evaluation of patient's response to treatment, examination of patient, ordering and performing treatments and interventions, ordering and review of laboratory studies, ordering and review of radiographic studies, pulse oximetry, re-evaluation of patient's condition, obtaining history from patient or surrogate and review of old charts   (including critical care time)  Medications Ordered in ED Medications  pantoprazole (PROTONIX) 80 mg in sodium chloride 0.9 % 100 mL IVPB (has no administration in time range)  pantoprazole (PROTONIX) 80 mg in sodium chloride 0.9 % 100 mL (0.8 mg/mL) infusion (has no administration in time range)  ondansetron (ZOFRAN) injection 4 mg (has no administration in time range)    ED Course  I have reviewed the triage vital signs and the nursing notes.  Pertinent labs & imaging results that were available during my care of the patient were reviewed by me and considered in my medical decision making (see chart for details).    MDM  Rules/Calculators/A&P                         Patient with multiple medical comorbidities that are end-stage including CHF, COPD, pacemaker in place, lung cancer, mitral valve replacement, A. fib on aspirin presenting with rectal bleeding since yesterday with abdominal pain.  Recent hospitalization 2 weeks ago for chest pain and respiratory issues. CT angiogram at that time showed a questionable cecal mass the patient and family not aware of.  Initial hypotension in the field did improve on ED arrival.  Does have gross blood on rectal exam.  Patient initially stated he would not want a blood transfusion.  He has agreed to DNR status.  Discussed with patient's wife and daughter by phone.  Wife [name] is his healthcare power of attorney.  She agrees with DNR.  She states patient has not indicated he would not want a blood transfusion in the past.  After discussing with him again, patient is agreeable to blood transfusion as necessary.  Initiate gentle IV fluids, IV Protonix.  Blood  pressure has dropped to 77 when patient was attempted to be stood up. He was immediately placed back in bed.  He is given gentle IV fluids.  Hemoglobin has come back at 6.5.  Baseline is in the 10 range.  INR is normal and platelets are normal.  Patient with poor EF in the 20% range.  He will need blood product resuscitation.  Ongoing rectal bleeding discussed with Dr. Lyndel Safe of [facility] gastroenterology. He will consult on patient.  Agrees with blood product resuscitation and IV PPI.  Does recommend repeat CTA to assess for active site of bleed.  If this is positive patient may be a candidate for IR.  If negative he may need a colonoscopy but his underlying heart and lung issues make him a poor candidate for that.  Dr. Lyndel Safe states this may be a terminal event.  Patient mentating well.  Blood pressure has improved to 105/62.  Heart rate is 77 but may be inaccurate due to his pacemaker. Patient sent for CTA to rule out active  bleeding.  Admission discussed with critical care team and Dr. Oletta Darter.  Dr. Zenia Resides aware of patient holding in the ED at shift change awaiting critical care evaluation. Final Clinical Impression(s) / ED Diagnoses Final diagnoses:  Weakness  Gastrointestinal hemorrhage, unspecified gastrointestinal hemorrhage type  Hemorrhagic shock [facility])    Rx / DC Orders ED Discharge Orders    None       Charmelle Soh, Annie Main, MD 12/24/19 [phone]

## 2019-12-24 NOTE — Telephone Encounter (Signed)
Contacted patient's wife, left message for a return call.  Patient's wife stated that he is currently admitted to [facility] for what they believe is a mass on his colon.  She stated that she would not consent to a colonoscopy because Dr. Roxy Manns advised that he should not be put to sleep under any circumstance.  Left voicemail message for Mrs. Smeltzer to return call.

## 2019-12-24 NOTE — ED Notes (Signed)
Date and time results received: 12/24/19 7:01 AM   Test: Lactic Acid Critical Value: 2.6  Name of Provider Notified: Dr Wyvonnia Dusky

## 2019-12-24 NOTE — ED Notes (Signed)
Patient verbalized understanding of receiving blood transfusion. Daughter at bedside and verbalized understanding. Patient signed consent form.

## 2019-12-24 NOTE — Consult Note (Signed)
Referring Provider: No ref. provider found Primary Care Physician:  [name], NP Primary Gastroenterologist:  Dr. Owens Loffler   Reason for Consultation:  Hematochezia, Anemia   HPI: [name] is a 74 y.o. male with a past medical history significant for hypertension, hyperlipidemia, coronary artery disease, MI in 6767, systolic and diastolic CHF (EF 20-94%), status post defibrillator/pacemaker, atrial fibrillation not on anticoagulation due to having an intracranial bleed while on Coumadin in 2009, s/p AV nodal ablation, CVA 2015,  thoracic aortic aneurysm, diabetes mellitus type 2, non-small cell lung cancer diagnosed in 2015 with wedge resection, COPD on O2 5L at home, pulmonary hypertension, spontaneous pneumothorax s/p thoracotomy, renal artery stenosis, alcoholic cirrhosis (no alcohol for 12+ years) and colon polyps. Past ASD repair and MVR surgery 2010. He was last seen in our office by Dr. Ardis Hughs 10/0/2018 for EtOH cirrhosis follow up. Surveillance abdominal sono and labs recommended Q 6 months. He has not been seen in our office since that time.   He was admitted to the hospital 11/13/2019-11/25/2019 for acute on chronic CHF.  He was aggressively diuresed but his respiratory status was slow to improve. He was discharged home 11/25/2019 on Lasix 60 mg twice daily.  On 5/28 he developed significant chest pain with a syncopal episode and he was readmitted to the hospital.  He underwent a chest/abdominal/pelvic CT angiogram, no evidence of a thoracic aortic dissection or PE was identified. Severe emphysematous changes with reactive bronchiolitis as noted.  A masslike appearance to the cecum with multiple small mildly enlarged regional lymph nodes.  Due to his comorbidities, he was not assessed to be a surgical candidate.  He was discharged to [facility] on 12/12/2019.   He presented from [facility] to  [facility] emergency room on this morning due to having hematochezia.   His daughter Lynelle Smoke is at the bed side, she is assisting with obtaining his history. He awakened around 4am today with lower abdominal pain, her reported passing loose stool with red blood per the rectum. He stated the blood was red, not too dark, not bright. No melena. His labs in the ED showed a hemoglobin of 6.5.  BUN 33.  An abdominal/pelvic CT angiogram was negative for acute GI bleeding.  Two units of packed red blood cells have been ordered, not yet transfused. He denies having any chest pain or palpitations. He is on O2 5 L nasal cannula. He reported his appetite was fair at the rehab facility. He is on aspirin 81 mg daily. No other NSAIDs. No anticoagulants. His wife (POA) is not present at this time. He is a DNR. He does not wish to have any invasive procedure as he acknowledges he is at high risk for sedation and anesthesia. He wishes to discuss any having further endoscopic evaluation with his wife.   Laboratory studies 12/24/2019: Sodium 137.  Potassium 3.6.  Kos 186.  BUN 33.  Creatinine 0.65.  Alk phos 53.  Albumin 2.5.  Lipase 25.  AST 11.  ALT 8.  Total bili 0.5.  WBC 7.9.  Hemoglobin 6.5.  Hematocrit 22.5.  MCV 83.6.  Platelet 177.  INR 1.2. SARS coronavirus 2 ordered.   Abdominal/pelvic CT angiogram 12/24/2019: 1. No evidence of active GI bleed  or other acute findings. 2. Stable right pleural thickening or loculated effusion. 3. Coronary and aortoiliac  atherosclerosis   Chest/abd/pelvic CT angiogram 12/06/2019: 1. No evidence for an acute vascular abnormality. There is no evidence for a  thoracic aortic dissection. No evidence for an acute pulmonary embolism. 2. 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 3. Profound cardiomegaly with findings of volume overload including small bilateral pleural effusions and body wall edema. The right-sided pleural effusion appears to be at least partially loculated. 4. Findings are concerning for a  cecal mass as detailed above. Follow-up with nonemergent colonoscopy is recommended. 5. Significantly distended urinary bladder. 6. Findings suspicious for underlying cirrhosis. 7. Relatively stable mediastinal and hilar adenopathy of unknown clinical significance. 8. Severe emphysematous changes with findings consistent with infectious or reactive bronchiolitis at the lung bases. Aortic Atherosclerosis (ICD10-I70.0) and Emphysema   ECHO 11/14/2019: 1. LVEF is severely depressed with diffuse hypokinesis, worse in the septal and apical walls Compared to report from Nov 2020 echo, LVEF is worse (apical views foreshortened in 2020 exam which makes assessment difficult). Left ventricular ejection fraction, by estimation, is 25 to 30%. The left ventricle has severely decreased function. The left ventricle demonstrates global hypokinesis. There is mild left ventricular hypertrophy. Left ventricular diastolic parameters are indeterminate. 2. Right ventricular systolic function is moderately reduced. The right ventricular size is severely enlarged. There is moderately elevated pulmonary artery systolic pressure. 3. Left atrial size was severely dilated. 4. Right atrial size was severely dilated. 5. A 33 mm Medtronic Mosai bioprosthesisiis present Peak and mean gradients through the vave are 15 and 6 mm Hg respectively, both lower than previous echo report in 2020.Marland Kitchen The mitral valve has been repaired/replaced. Trivial mitral valve regurgitation. 6. The aortic valve is abnormal. Aortic valve regurgitation is not visualized. Mild to moderate aortic valve sclerosis/calcification is present, without any evidence of aortic stenosis. 7. Aortic root is 51 mm Previous echo measured echo above this; current exam did not image that high.. Aortic dilatation noted. Aneurysm of the aortic root. There is moderate to severe dilatation of the aortic root measuring 51 mm. 8. The inferior vena cava is dilated in  size with <50% respiratory variability, suggesting right atrial pressure of 15 mmHg  EGD 09/29/2016 by Dr. Ardis Hughs: - Z-line irregular but non-nodular. - Gastritis. Biopsied. - Non-bleeding duodenal diverticulum. - No signs of portal hypertension.  Past Medical History:  Diagnosis Date   Atrial septal defect    Closed with surgery January, 2010   Automatic implantable cardioverter-defibrillator in situ    LV dysfunction and pacer needed for AV node lesion   Chronic combined systolic and diastolic CHF (congestive heart failure) (HCC)    Colon polyps    COPD (chronic obstructive pulmonary disease) (HCC)    O2- 2 liters, nasal cannula, q night    CVA (cerebral vascular accident) ([place]) 2009   denies residual on 08/14/2013   Diabetes mellitus without complication ([place])    type 2   Dyslipidemia    Endocarditis    Bacterial, 2009   Hypertension    Intracranial hemorrhage (HCC)    Coumadin cannot be used because of the history of his bleed   Lung cancer ([place]) 11/29/2013   T1N0 Stage Ia non-small cell carcinoma left lung treated with wedge resection   Myocardial infarction [facility]) 2010   Pacemaker    combo pacer and icd   Permanent atrial fibrillation    Originally Coumadin use for atrial fibrillation  //   he had intracerebral hemorrhage with an INR of 2.3 June, 2009. Anticoagulation could no longer be used.  //  Rapid atrial fibrillation after inferior MI October, 2010..........Marland Kitchen AV node ablation done at that time with  ICD pacemaker placed (EF 35%).   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     Past Surgical History:  Procedure Laterality Date   APPENDECTOMY     ASD REPAIR, SECUNDUM  07/17/2008   pericardial patch closure of ASD   AV NODE ABLATION  07/2008   for rapid atrial fib   CARDIAC CATHETERIZATION     CARDIAC DEFIBRILLATOR PLACEMENT  ~ 2010   [place] REPLACEMENT     CATARACT EXTRACTION W/ INTRAOCULAR LENS  IMPLANT, BILATERAL Bilateral    ESOPHAGOGASTRODUODENOSCOPY (EGD) WITH PROPOFOL N/A 09/29/2016   Procedure: ESOPHAGOGASTRODUODENOSCOPY (EGD) WITH PROPOFOL;  Surgeon: [name], MD;  Location: [facility] ENDOSCOPY;  Service: Endoscopy;  Laterality: N/A;   HERNIA REPAIR     IMPLANTABLE CARDIOVERTER DEFIBRILLATOR (ICD) GENERATOR CHANGE N/A 02/06/2012   Procedure: ICD GENERATOR CHANGE;  Surgeon: [name], MD;  Location: [facility] CATH LAB;  Service: Cardiovascular;  Laterality: N/A;   INSERT / REPLACE / REMOVE PACEMAKER     MASS BIOPSY Left    neck mass   MITRAL VALVE REPLACEMENT Right 07/17/2008   20m Medtronic Mosaic porcine bioprosthesis   PENILE PROSTHESIS IMPLANT     RIGHT HEART CATHETERIZATION N/A 08/16/2013   Procedure: RIGHT HEART CATH;  Surgeon: PJosue Hector MD;  [facility];  Service: Cardiovascular;  Laterality: N/A;   TEE WITHOUT CARDIOVERSION N/A 09/06/2016   Procedure: TRANSESOPHAGEAL ECHOCARDIOGRAM (TEE);  Surgeon: KDorothy Spark MD;  [facility];  Service: Cardiovascular;  Laterality: N/A;   THORACOTOMY Right 1970's   spontaneous pneumothorax - while in the [place]    left foot hammer toe   TONSILLECTOMY     VIDEO ASSISTED THORACOSCOPY (VATS)/WEDGE RESECTION Left 11/29/2013   Procedure: Video assisted thoracoscopy for wedge resection; mini  thoracotomy;  Surgeon: CRexene Alberts MD;  [place] [facility]  Service: Thoracic;  Laterality: Left;    Prior to Admission medications   Medication Sig Start Date End Date Taking? Authorizing Provider  acetaminophen (TYLENOL) 500 MG tablet Take 2 tablets (1,000 mg total) by mouth every 8 (eight) hours. 11/25/19 01/24/20  GMercy Riding MD  aspirin EC 81 MG tablet Take 1 tablet (81 mg total) by mouth daily. 12/07/12   KCarlena Bjornstad MD  atorvastatin (LIPITOR) 10 MG tablet Take 10 mg by mouth daily.    [provider]  bethanechol (URECHOLINE) 10 MG tablet Take 1 tablet (10 mg total) by mouth 3 (three) times daily. 12/03/19   Nafziger, CTommi Rumps NP  Budeson-Glycopyrrol-Formoterol (BREZTRI AEROSPHERE) 160-9-4.8 MCG/ACT AERO Inhale 2 puffs into the lungs in the morning and at bedtime. 11/08/19   WMartyn Ehrich NP  citalopram (CELEXA) 10 MG tablet Take 1 tablet (10 mg total) by mouth daily. 12/12/19  Antonieta Pert, MD  digoxin (LANOXIN) 0.125 MG tablet Take 1 tablet (0.125 mg total) by mouth daily. 12/12/19   Antonieta Pert, MD  furosemide (LASIX) 80 MG tablet Take 1 tablet (80 mg total) by mouth 2 (two) times daily. 12/12/19   Antonieta Pert, MD  levalbuterol (XOPENEX) 0.63 MG/3ML nebulizer solution Take 3 mLs (0.63 mg total) by nebulization 2 (two) times daily. 12/12/19   Antonieta Pert, MD  metFORMIN (GLUCOPHAGE) 1000 MG tablet TAKE 1 TABLET BY MOUTH TWICE A DAY WITH MEALS 12/05/19   Nafziger, Tommi Rumps, NP  nitroGLYCERIN (NITROSTAT) 0.4 MG SL tablet Place 0.4 mg under the tongue every 5 (five) minutes x 3 doses as needed for chest pain.     [provider]  pantoprazole (PROTONIX) 40 MG tablet Take 1 tablet (40 mg total) by mouth daily at 6 (six) AM. 12/12/19   Antonieta Pert, MD  potassium chloride SA (KLOR-CON) 20 MEQ tablet Take 2 tablets (40 mEq total) by mouth daily. 08/12/19 11/13/19  Nafziger, Tommi Rumps, NP  tamsulosin (FLOMAX) 0.4 MG CAPS capsule Take 1 capsule (0.4 mg total) by mouth daily after supper. 12/03/19    Nafziger, Tommi Rumps, NP  vitamin B-12 1000 MCG tablet Take 1 tablet (1,000 mcg total) by mouth daily. 11/25/19   Mercy Riding, MD    Current Facility-Administered Medications  Medication Dose Route Frequency Provider Last Rate Last Admin   0.9 %  sodium chloride infusion (Manually program via Guardrails IV Fluids)   Intravenous Once Rancour, Annie Main, MD       ondansetron [facility]) injection 4 mg  4 mg Intravenous Once Rancour, Stephen, MD       pantoprazole (PROTONIX) 80 mg in sodium chloride 0.9 % 100 mL (0.8 mg/mL) infusion  8 mg/hr Intravenous Continuous Rancour, Stephen, MD 10 mL/hr at 12/24/19 0624 8 mg/hr at 12/24/19 0624   sodium chloride (PF) 0.9 % injection            Current Outpatient Medications  Medication Sig Dispense Refill   acetaminophen (TYLENOL) 500 MG tablet Take 2 tablets (1,000 mg total) by mouth every 8 (eight) hours. 180 tablet 1   aspirin EC 81 MG tablet Take 1 tablet (81 mg total) by mouth daily. 90 tablet 3   atorvastatin (LIPITOR) 10 MG tablet Take 10 mg by mouth daily.     bethanechol (URECHOLINE) 10 MG tablet Take 1 tablet (10 mg total) by mouth 3 (three) times daily. 30 tablet 0   Budeson-Glycopyrrol-Formoterol (BREZTRI AEROSPHERE) 160-9-4.8 MCG/ACT AERO Inhale 2 puffs into the lungs in the morning and at bedtime. 5.9 g 0   citalopram (CELEXA) 10 MG tablet Take 1 tablet (10 mg total) by mouth daily.     digoxin (LANOXIN) 0.125 MG tablet Take 1 tablet (0.125 mg total) by mouth daily.     furosemide (LASIX) 80 MG tablet Take 1 tablet (80 mg total) by mouth 2 (two) times daily.     levalbuterol (XOPENEX) 0.63 MG/3ML nebulizer solution Take 3 mLs (0.63 mg total) by nebulization 2 (two) times daily. 3 mL 12   metFORMIN (GLUCOPHAGE) 1000 MG tablet TAKE 1 TABLET BY MOUTH TWICE A DAY WITH MEALS 180 tablet 0   nitroGLYCERIN (NITROSTAT) 0.4 MG SL tablet Place 0.4 mg under the tongue every 5 (five) minutes x 3 doses as needed for chest pain.      pantoprazole  (PROTONIX) 40 MG tablet Take 1 tablet (40 mg total) by mouth daily at 6 (six) AM.     potassium chloride  SA (KLOR-CON) 20 MEQ tablet Take 2 tablets (40 mEq total) by mouth daily. 180 tablet 1   tamsulosin (FLOMAX) 0.4 MG CAPS capsule Take 1 capsule (0.4 mg total) by mouth daily after supper. 30 capsule 1   vitamin B-12 1000 MCG tablet Take 1 tablet (1,000 mcg total) by mouth daily. 90 tablet 1    Allergies as of 12/24/2019 - Review Complete 12/24/2019  Allergen Reaction Noted   Anticoagulant compound Other (See Comments) 08/12/2013   Other Other (See Comments) 07/30/2018   Warfarin sodium Other (See Comments)     Family History  Problem Relation Age of Onset   Stomach cancer Father    Stroke Mother     Social History   Socioeconomic History   Marital status: Married    Spouse name: [name]   Number of children: 2   Years of education: College   Highest education level: Not on file  Occupational History   Occupation: Retired    Fish farm manager: RETIRED    Comment: Tour manager  Tobacco Use   Smoking status: Former Smoker    Packs/day: 1.00    Years: 45.00    Pack years: 45.00    Types: Cigarettes    Quit date: 2011    Years since quitting: 10.4   Smokeless tobacco: Never Used  Scientific laboratory technician Use: Never used  Substance and Sexual Activity   Alcohol use: No    Alcohol/week: 0.0 standard drinks    Comment: 08/14/2013 "used to drink beer; quit:in 1982"   Drug use: No   Sexual activity: Yes  Other Topics Concern   Not on file  Social History Narrative   Patient lives at home with his spouse.   Worked for the post [employer]   Has two boys and a girl. All live local.    1 Mining engineer   Social Determinants of Health   Financial Resource Strain: Low Risk    Difficulty of Paying Living Expenses: Not hard at all  Food Insecurity: No Food Insecurity   Worried About Charity fundraiser in the Last Year: Never true   Arboriculturist in the  Last Year: Never true  Transportation Needs: No Transportation Needs   Lack of Transportation (Medical): No   Lack of Transportation (Non-Medical): No  Physical Activity: Inactive   Days of Exercise per Week: 0 days   Minutes of Exercise per Session: 0 min  Stress: No Stress Concern Present   Feeling of Stress : Only a little  Social Connections: Unknown   Frequency of Communication with Friends and Family: More than three times a week   Frequency of Social Gatherings with Friends and Family: Three times a week   Attends Religious Services: Not on file   Active Member of Clubs or Organizations: Not on file   Attends Archivist Meetings: Not on file   Marital Status: Married  Intimate Partner Violence:    Fear of Current or Ex-Partner:    Emotionally Abused:    Physically Abused:    Sexually Abused:     Review of Systems:  See HPI, all other systems reviewed and are negative.   Physical Exam: Vital signs in last 24 hours: Temp:  [97.5 F (36.4 C)] 97.5 F (36.4 C) (06/15 0524) Pulse Rate:  [73-77] 77 (06/15 0637) Resp:  [15-18] 15 (06/15 0637) BP: (105-111)/(61-62) 105/62 (06/15 0637) SpO2:  [100 %] 100 % (06/15 9191)   General: Fatigued appearing  74 year old male in no acute distress. Head:  Normocephalic and atraumatic. Eyes:  No scleral icterus. Conjunctiva pink. Ears:  Normal auditory acuity. Nose:  No deformity, discharge or lesions. Mouth: Poor dentition. No ulcers or lesions.  Neck:  Supple. No lymphadenopathy or thyromegaly.  Lungs: Diminished breath sounds throughout. Heart: Regular rate and rhythm, no murmurs. Abdomen: Soft, nondistended. Nontender. No masses or organomegaly. No HSM. Positive bowel sounds to all 4 quadrants. Rectal: Thick dark red blood in the rectum. Grossly enlarged prostate. Posterior external hemorrhoid without obvious inflammation or active bleeding.  Musculoskeletal:  Symmetrical without gross deformities.    Pulses:  Normal pulses noted. Extremities:  Without clubbing or edema. Right 3rd toe nail with dried blood. Neurologic:  Alert and  oriented x 2. No focal deficits.  Skin:  Intact without significant lesions or rashes. Psych:  Alert and cooperative. Normal mood and affect.  Intake/Output from previous day: 06/14 0701 - 06/15 0700 In: 100 [IV Piggyback:100] Out: -  Intake/Output this shift: No intake/output data recorded.  Lab Results: Recent Labs    12/24/19 0525  WBC 7.9  HGB 6.5*  HCT 22.5*  PLT 177   BMET Recent Labs    12/24/19 0525  NA 137  K 3.6  CL 104  CO2 26  GLUCOSE 186*  BUN 33*  CREATININE 0.65  CALCIUM 6.9*   LFT Recent Labs    12/24/19 0525  PROT 4.7*  ALBUMIN 2.5*  AST 11*  ALT 8  ALKPHOS 53  BILITOT 0.5   PT/INR Recent Labs    12/24/19 0525  LABPROT 14.5  INR 1.2   Hepatitis Panel No results for input(s): HEPBSAG, HCVAB, HEPAIGM, HEPBIGM in the last 72 hours.    Studies/Results: DG Chest Port 1 View  Result Date: 12/24/2019 CLINICAL DATA:  Weakness EXAM: PORTABLE CHEST 1 VIEW COMPARISON:  Dec 06, 2019 FINDINGS: The heart size and mediastinal contours are stable. A right-sided pacemaker seen with the lead tips in the right atrium right ventricle. Again noted is bilateral hilar adenopathy. Streaky atelectasis seen at the right lung base. Surgical suture at the left upper lung. No large airspace consolidation. No acute osseous abnormality. IMPRESSION: No active disease. Electronically Signed   By: Prudencio Pair M.D.   On: 12/24/2019 06:17   DG Abd Portable 2 Views  Result Date: 12/24/2019 CLINICAL DATA:  Weakness EXAM: PORTABLE ABDOMEN - 2 VIEW COMPARISON:  None. FINDINGS: The bowel gas pattern is normal. There is no evidence of free air. No radio-opaque calculi or other significant radiographic abnormality is seen. IMPRESSION: Negative. Electronically Signed   By: Prudencio Pair M.D.   On: 12/24/2019 06:17    IMPRESSION/PLAN:  11.   74 year old male with admitted with hematochezia and lower abdominal pain. CTAP 5/28 showed a possible cecal mass. Hg 6.5 ( Hg 9.7 on 12/16/2019). BUN 33 (BUN 21 on6/3).  2 units of PRBCs ordered, not yet transfused. No further hematochezia in the ED. He remains hemodynamically stable. He is a DNR. -NPO -IV fluid per the hospitalist -Await posttransfusion H&H -Transfuse to maintain hemoglobin > 8 -Patient is at high risk for sedation complications due to his multiple morbidities as listed in HPI.  I discussed a possible diagnostic colonoscopy.  He does not wish to pursue an endoscopic evaluation at this time but he would like to discuss this further with his wife who is also his power of attorney. -Pain management per the hospital  2. Atrial fibrillation on ASA, no anticoagulation due to past  ICH  3. CAD. Combined systolic and diastolic CHF (EF 50-56%). AICD/pacemaker.   4. History of non-small cell lung cancer s/p VATS, COPD on 5 L nasal cannula at home  5. Diabetes mellitus type 2  6. History of EtOH cirrhosis. No alcohol x 12 years.  LFTs, T. Bili and PLT counts are normal   Further recommendations per Dr.  Lorenda Cahill Dorathy Daft  12/24/2019, 09:30AM

## 2019-12-25 DIAGNOSIS — E43 Unspecified severe protein-calorie malnutrition: Secondary | ICD-10-CM | POA: Insufficient documentation

## 2019-12-25 LAB — CBC
HCT: 25.3 % — ABNORMAL LOW (ref 39.0–52.0)
Hemoglobin: 7.7 g/dL — ABNORMAL LOW (ref 13.0–17.0)
MCH: 26.1 pg (ref 26.0–34.0)
MCHC: 30.4 g/dL (ref 30.0–36.0)
MCV: 85.8 fL (ref 80.0–100.0)
Platelets: 181 10*3/uL (ref 150–400)
RBC: 2.95 MIL/uL — ABNORMAL LOW (ref 4.22–5.81)
RDW: 21.6 % — ABNORMAL HIGH (ref 11.5–15.5)
WBC: 7.6 10*3/uL (ref 4.0–10.5)
nRBC: 0 % (ref 0.0–0.2)

## 2019-12-25 LAB — GLUCOSE, CAPILLARY
Glucose-Capillary: 111 mg/dL — ABNORMAL HIGH (ref 70–99)
Glucose-Capillary: 140 mg/dL — ABNORMAL HIGH (ref 70–99)
Glucose-Capillary: 143 mg/dL — ABNORMAL HIGH (ref 70–99)
Glucose-Capillary: 147 mg/dL — ABNORMAL HIGH (ref 70–99)
Glucose-Capillary: 242 mg/dL — ABNORMAL HIGH (ref 70–99)
Glucose-Capillary: 272 mg/dL — ABNORMAL HIGH (ref 70–99)

## 2019-12-25 LAB — TYPE AND SCREEN
ABO/RH(D): O POS
Antibody Screen: NEGATIVE
Unit division: 0
Unit division: 0

## 2019-12-25 LAB — BPAM RBC
Blood Product Expiration Date: 202107082359
Blood Product Expiration Date: 202107142359
ISSUE DATE / TIME: 202106150945
ISSUE DATE / TIME: 202106151355
Unit Type and Rh: 5100
Unit Type and Rh: 5100

## 2019-12-25 LAB — BASIC METABOLIC PANEL
Anion gap: 9 (ref 5–15)
BUN: 20 mg/dL (ref 8–23)
CO2: 28 mmol/L (ref 22–32)
Calcium: 8.2 mg/dL — ABNORMAL LOW (ref 8.9–10.3)
Chloride: 97 mmol/L — ABNORMAL LOW (ref 98–111)
Creatinine, Ser: 0.5 mg/dL — ABNORMAL LOW (ref 0.61–1.24)
GFR calc Af Amer: >60 mL/min (ref >60)
GFR calc non Af Amer: >60 mL/min (ref >60)
Glucose, Bld: 151 mg/dL — ABNORMAL HIGH (ref 70–99)
Potassium: 4.9 mmol/L (ref 3.5–5.1)
Sodium: 134 mmol/L — ABNORMAL LOW (ref 135–145)

## 2019-12-25 MED ORDER — BOOST / RESOURCE BREEZE PO LIQD CUSTOM
1.0000 | ORAL | Status: DC
  Administered 2019-12-26 – 2019-12-27 (×2): 237 mL via ORAL
  Administered 2019-12-28 – 2019-12-30 (×3): 1 via ORAL

## 2019-12-25 MED ORDER — KATE FARMS STANDARD 1.4 PO LIQD
325.0000 mL | ORAL | Status: DC
  Administered 2019-12-25 – 2019-12-27 (×2): 325 mL via ORAL
  Filled 2019-12-25 (×6): qty 325

## 2019-12-25 MED ORDER — ATORVASTATIN CALCIUM 10 MG PO TABS
10.0000 mg | ORAL_TABLET | Freq: Every day | ORAL | Status: DC
  Administered 2019-12-25 – 2020-01-02 (×9): 10 mg via ORAL
  Filled 2019-12-25 (×10): qty 1

## 2019-12-25 MED ORDER — VITAMIN B-12 1000 MCG PO TABS
1000.0000 ug | ORAL_TABLET | Freq: Every day | ORAL | Status: DC
  Administered 2019-12-25 – 2020-01-15 (×22): 1000 ug via ORAL
  Filled 2019-12-25 (×22): qty 1

## 2019-12-25 MED ORDER — PANTOPRAZOLE SODIUM 40 MG PO TBEC
40.0000 mg | DELAYED_RELEASE_TABLET | Freq: Every day | ORAL | Status: DC
  Administered 2019-12-25 – 2020-01-15 (×22): 40 mg via ORAL
  Filled 2019-12-25 (×22): qty 1

## 2019-12-25 MED ORDER — TAMSULOSIN HCL 0.4 MG PO CAPS
0.4000 mg | ORAL_CAPSULE | Freq: Every day | ORAL | Status: DC
  Administered 2019-12-25 – 2020-01-15 (×21): 0.4 mg via ORAL
  Filled 2019-12-25 (×22): qty 1

## 2019-12-25 MED ORDER — DIGOXIN 125 MCG PO TABS
0.1250 mg | ORAL_TABLET | Freq: Every day | ORAL | Status: DC
  Administered 2019-12-25 – 2020-01-15 (×22): 0.125 mg via ORAL
  Filled 2019-12-25 (×22): qty 1

## 2019-12-25 MED ORDER — CITALOPRAM HYDROBROMIDE 10 MG PO TABS
10.0000 mg | ORAL_TABLET | Freq: Every day | ORAL | Status: DC
  Administered 2019-12-25 – 2020-01-15 (×22): 10 mg via ORAL
  Filled 2019-12-25 (×22): qty 1

## 2019-12-25 MED ORDER — INSULIN ASPART 100 UNIT/ML ~~LOC~~ SOLN
0.0000 [IU] | Freq: Every day | SUBCUTANEOUS | Status: DC
  Administered 2019-12-26 – 2020-01-09 (×5): 2 [IU] via SUBCUTANEOUS

## 2019-12-25 MED ORDER — ONDANSETRON HCL 4 MG/2ML IJ SOLN
4.0000 mg | Freq: Four times a day (QID) | INTRAMUSCULAR | Status: DC | PRN
  Administered 2019-12-26: 4 mg via INTRAVENOUS
  Filled 2019-12-25: qty 2

## 2019-12-25 MED ORDER — BETHANECHOL CHLORIDE 10 MG PO TABS
10.0000 mg | ORAL_TABLET | Freq: Three times a day (TID) | ORAL | Status: DC
  Administered 2019-12-25 – 2020-01-15 (×65): 10 mg via ORAL
  Filled 2019-12-25 (×66): qty 1

## 2019-12-25 MED ORDER — INSULIN ASPART 100 UNIT/ML ~~LOC~~ SOLN
0.0000 [IU] | Freq: Three times a day (TID) | SUBCUTANEOUS | Status: DC
  Administered 2019-12-25: 1 [IU] via SUBCUTANEOUS
  Administered 2019-12-25: 5 [IU] via SUBCUTANEOUS
  Administered 2019-12-26: 1 [IU] via SUBCUTANEOUS
  Administered 2019-12-26: 3 [IU] via SUBCUTANEOUS
  Administered 2019-12-26: 2 [IU] via SUBCUTANEOUS
  Administered 2019-12-27: 3 [IU] via SUBCUTANEOUS
  Administered 2019-12-27: 1 [IU] via SUBCUTANEOUS
  Administered 2019-12-27 – 2019-12-28 (×3): 2 [IU] via SUBCUTANEOUS
  Administered 2019-12-28: 3 [IU] via SUBCUTANEOUS
  Administered 2019-12-29: 1 [IU] via SUBCUTANEOUS
  Administered 2019-12-29 (×2): 2 [IU] via SUBCUTANEOUS
  Administered 2019-12-30: 3 [IU] via SUBCUTANEOUS
  Administered 2019-12-30: 5 [IU] via SUBCUTANEOUS
  Administered 2019-12-30 – 2019-12-31 (×3): 2 [IU] via SUBCUTANEOUS
  Administered 2019-12-31: 1 [IU] via SUBCUTANEOUS
  Administered 2020-01-01: 2 [IU] via SUBCUTANEOUS
  Administered 2020-01-01: 1 [IU] via SUBCUTANEOUS
  Administered 2020-01-01: 3 [IU] via SUBCUTANEOUS
  Administered 2020-01-02 (×2): 1 [IU] via SUBCUTANEOUS
  Administered 2020-01-02: 3 [IU] via SUBCUTANEOUS
  Administered 2020-01-03 (×2): 1 [IU] via SUBCUTANEOUS
  Administered 2020-01-03 – 2020-01-04 (×2): 2 [IU] via SUBCUTANEOUS
  Administered 2020-01-04: 5 [IU] via SUBCUTANEOUS
  Administered 2020-01-04: 2 [IU] via SUBCUTANEOUS
  Administered 2020-01-05: 3 [IU] via SUBCUTANEOUS
  Administered 2020-01-05 (×2): 2 [IU] via SUBCUTANEOUS
  Administered 2020-01-06 (×2): 1 [IU] via SUBCUTANEOUS
  Administered 2020-01-06: 3 [IU] via SUBCUTANEOUS
  Administered 2020-01-07: 2 [IU] via SUBCUTANEOUS
  Administered 2020-01-07 (×2): 1 [IU] via SUBCUTANEOUS
  Administered 2020-01-08: 2 [IU] via SUBCUTANEOUS
  Administered 2020-01-08: 3 [IU] via SUBCUTANEOUS
  Administered 2020-01-08 – 2020-01-09 (×2): 1 [IU] via SUBCUTANEOUS
  Administered 2020-01-09: 3 [IU] via SUBCUTANEOUS
  Administered 2020-01-09 – 2020-01-11 (×5): 2 [IU] via SUBCUTANEOUS
  Administered 2020-01-11: 3 [IU] via SUBCUTANEOUS
  Administered 2020-01-12 (×2): 2 [IU] via SUBCUTANEOUS
  Administered 2020-01-13: 3 [IU] via SUBCUTANEOUS
  Administered 2020-01-13 – 2020-01-14 (×3): 2 [IU] via SUBCUTANEOUS
  Administered 2020-01-14: 1 [IU] via SUBCUTANEOUS
  Administered 2020-01-15 (×2): 2 [IU] via SUBCUTANEOUS

## 2019-12-25 NOTE — Evaluation (Signed)
Physical Therapy Evaluation Patient Details Name: [name] MRN: [id number] DOB: September 25, 1945 Today's Date: 12/25/2019   History of Present Illness  74 yo male presented from rehab center with abdominal pain and maroon colored stool.  PCCM asked to admit to ICU.  Recent admission at [facility] where pt had CT abd/pelvis 12/06/19 that showed possible cecal mass. Pt DCed to [facility].   He is DNR/DNI.  Opted against any endoscopic procedures. PMH of CHF, COPD on 5L O2 baseline, a fib, lung cancer s/p wedge resection, DM2, HTN, HLD  Clinical Impression  Pt admitted with above diagnosis. Pt ambulated 200' with RW, SaO2 88-94% on 8L O2 HFNC, no dyspnea noted. Pt currently with functional limitations due to the deficits listed below (see PT Problem List). Pt will benefit from skilled PT to increase their independence and safety with mobility to allow discharge to the venue listed below.       Follow Up Recommendations Home health PT;Supervision for mobility/OOB    Equipment Recommendations  Rolling walker with 5" wheels    Recommendations for Other Services       Precautions / Restrictions Precautions Precautions: ICD/Pacemaker;Fall Precaution Comments: watch SPO2, on O2 at baseline Restrictions Weight Bearing Restrictions: No      Mobility  Bed Mobility Overal bed mobility: Needs Assistance Bed Mobility: Supine to Sit     Supine to sit: HOB elevated;Supervision     General bed mobility comments: used bedrail, HOB up  Transfers Overall transfer level: Needs assistance Equipment used: Rolling walker (2 wheeled) Transfers: Sit to/from Stand Sit to Stand: Min guard         General transfer comment: VCs for hand placement  Ambulation/Gait Ambulation/Gait assistance: Min guard Gait Distance (Feet): 200 Feet Assistive device: Rolling walker (2 wheeled) Gait Pattern/deviations: Step-through pattern;Decreased stride length;Trunk flexed Gait velocity: WFL   General Gait  Details: VCs to step closer to RW, no loss of balance, pt ambulated with 8L O2 on HFNC, SaO2 88-94% while ambulating, no dyspnea noted  Stairs            Wheelchair Mobility    Modified Rankin (Stroke Patients Only)       Balance   Sitting-balance support: Feet supported;No upper extremity supported Sitting balance-Leahy Scale: Good       Standing balance-Leahy Scale: Fair Standing balance comment: BUE support for dynamic standing balance                             Pertinent Vitals/Pain Pain Assessment: No/denies pain    Home Living Family/patient expects to be discharged to:: Private residence Living Arrangements: Spouse/significant other Available Help at Discharge: Family Type of Home: House Home Access: Stairs to enter Entrance Stairs-Rails: Can reach both Entrance Stairs-Number of Steps: 3 Home Layout: One level Home Equipment: Shower seat;Grab bars - tub/shower;Grab bars - toilet Additional Comments: Wears 5L O2 at home    Prior Function Level of Independence: Independent         Comments: Independent, 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)     Hand Dominance   Dominant Hand: Left    Extremity/Trunk Assessment   Upper Extremity Assessment Upper Extremity Assessment: Overall WFL for tasks assessed    Lower Extremity Assessment Lower Extremity Assessment: Overall WFL for tasks assessed    Cervical / Trunk Assessment Cervical / Trunk Assessment: Kyphotic  Communication   Communication: HOH  Cognition Arousal/Alertness: Awake/alert Behavior During Therapy: [facility]  for tasks assessed/performed Overall Cognitive Status: Within Functional Limits for tasks assessed                                        General Comments      Exercises     Assessment/Plan    PT Assessment Patient needs continued PT services  PT Problem List Decreased activity tolerance;Decreased balance;Decreased knowledge of use of DME;Decreased  mobility       PT Treatment Interventions DME instruction;Gait training;Stair training;Functional mobility training;Therapeutic activities;Therapeutic exercise;Balance training;Neuromuscular re-education;Patient/family education    PT Goals (Current goals can be found in the Care Plan section)  Acute Rehab PT Goals Patient Stated Goal: to go home PT Goal Formulation: With patient Time For Goal Achievement: 01/08/20 Potential to Achieve Goals: Good    Frequency Min 3X/week   Barriers to discharge        Co-evaluation               AM-PAC PT "6 Clicks" Mobility  Outcome Measure Help needed turning from your back to your side while in a flat bed without using bedrails?: None Help needed moving from lying on your back to sitting on the side of a flat bed without using bedrails?: None Help needed moving to and from a bed to a chair (including a wheelchair)?: A Little Help needed standing up from a chair using your arms (e.g., wheelchair or bedside chair)?: A Little Help needed to walk in hospital room?: A Little Help needed climbing 3-5 steps with a railing? : A Little 6 Click Score: 20    End of Session Equipment Utilized During Treatment: Oxygen;Gait belt Activity Tolerance: Patient tolerated treatment well Patient left: with call bell/phone within reach;in chair;with chair alarm set Nurse Communication: Mobility status PT Visit Diagnosis: Other abnormalities of gait and mobility (R26.89);Difficulty in walking, not elsewhere classified (R26.2)    Time: 4680-3212 PT Time Calculation (min) (ACUTE ONLY): 20 min   Charges:   PT Evaluation $PT Eval Low Complexity: 1 Low         Blondell Reveal Kistler PT 12/25/2019  Acute Rehabilitation Services Pager [phone] Office [phone]

## 2019-12-25 NOTE — Progress Notes (Signed)
RT called to assess patient for low O2 in the 70's. Patient is on chronic O2 at baseline - 5 L [place]. Upon arrival, O2 sats were 74%. Patient was not in distress at this time. Patient pulse ox changed by nursing and a secondary pulse ox used to confirm; O2 sats remained in the 70's. Patient had some cyanosis noted on his fingers and were not cold to the touch. During assessment patient remained in no distress. Patient [place] changed to HFNC @ 8 L; sats increased to 91%. VS otherwise stable during this encounter. Patient denies any issues with his breathing at this time. RT and RN will attempt to wean patient O2 as tolerated to 5 L and keep O2 goal 88-95% per physician order.

## 2019-12-25 NOTE — Progress Notes (Signed)
Initial Nutrition Assessment  DOCUMENTATION CODES:   Severe malnutrition in context of chronic illness, Underweight  INTERVENTION:  - will order Boost Breeze once/day, each supplement provides 250 kcal and 9 grams of protein. - will order [employer] once/day, each supplement provides 455 kcal and 20 grams protein. - will order Magic Cup with lunch meals, each supplement provides 290 kcal and 9 grams of protein.  NUTRITION DIAGNOSIS:   Severe Malnutrition related to chronic illness (COPD and lung cancer) as evidenced by moderate fat depletion, moderate muscle depletion, severe fat depletion, severe muscle depletion.  GOAL:   Patient will meet greater than or equal to 90% of their needs  MONITOR:   PO intake, Supplement acceptance, Labs, Weight trends  REASON FOR ASSESSMENT:   Other (Comment) (underweight BMI)  ASSESSMENT:   74 yo male with medical history of HTN, endocarditis, dyslipidemia, septal defect, pulmonary HTN, CVA  without residual deficits, afib, s/p mitral valve replacement, CHF, COPD, MI, DM, and lung cancer (NSCLC). He presented from rehab center with abdominal pain and maroon colored stool. He was admitted to ICU. CT abdomen/pelvis done on 5/28 showed possible cecal mass.  No intakes documented since admission. Patient reports that for breakfast he had 2 slices of french toast with syrup, bacon, oatmeal, and 2 cups of juice and that he consumed nearly 100% of the meal.   He denied any chewing or swallowing difficulty. He reports abdominal pain is about the same or slightly better from yesterday but not exacerbated by breakfast meal. Patient states RN ordered lunch for him; which had not arrived at the time of RD visit.   Patient was fairly hard of hearing which made communication difficult at times and limited information able to be gathered at this time. No family/visitors present during RD visit.  Per chart review, weight today is 136 lb and weight on 5/25 was 157  lb. This indicates 21 lb weight loss (13.4% body weight) in the past 3 weeks; significant. Will continue to monitor weight trends.    Labs reviewed; CBGs: 143 and 111 mg/dl, Ca: 8.2 mg/dl, Na: 134 mmol/l, Cl: 97 mmol/l, creatinine: 0.5 mg/dl. Medications reviewed; sliding scale novolog, 40 mg oral protonix/day, 1000 mcg oral cyanocobalamin/day.  IVF; LR @ 50 ml/hr.    NUTRITION - FOCUSED PHYSICAL EXAM:    Most Recent Value  Orbital Region Moderate depletion  Upper Arm Region Severe depletion  Thoracic and Lumbar Region Unable to assess  Buccal Region Moderate depletion  Temple Region Moderate depletion  Clavicle Bone Region Severe depletion  Clavicle and Acromion Bone Region Severe depletion  Scapular Bone Region Moderate depletion  Dorsal Hand Severe depletion  Patellar Region Severe depletion  Anterior Thigh Region Severe depletion  Posterior Calf Region Severe depletion  Edema (RD Assessment) None  Hair Reviewed  Eyes Reviewed  Mouth Reviewed  Skin Reviewed  Nails Reviewed       Diet Order:   Diet Order            Diet regular Room service appropriate? Yes; Fluid consistency: Thin  Diet effective now                 EDUCATION NEEDS:   No education needs have been identified at this time  Skin:  Skin Assessment: Skin Integrity Issues: Skin Integrity Issues:: Stage I Stage I: L + R coccyx  Last BM:  6/16  Height:   Ht Readings from Last 1 Encounters:  12/24/19 6\' 1"  (1.854 m)  Weight:   Wt Readings from Last 1 Encounters:  12/25/19 61.7 kg    Estimated Nutritional Needs:  Kcal:  1850-2040 kcal Protein:  90-105 grams Fluid:  >/= 2 L/day     Jarome Matin, MS, RD, LDN, CNSC Inpatient Clinical Dietitian RD pager # available in [place]  After hours/weekend pager # available in [facility]

## 2019-12-25 NOTE — Progress Notes (Addendum)
Pt sats 70's. Bp softened. Pt remains alert and oriented and in no distress. Pt calm and lying in bed.  Pt able to communicate without being SOB. RT and MD made aware. Oxygen increased. Will continue to monitor.

## 2019-12-25 NOTE — Progress Notes (Signed)
NAME:  [name], MRN:  [id number], DOB:  1945/12/25, LOS: 1 ADMISSION DATE:  12/24/2019, CONSULTATION DATE:  12/24/2019 REFERRING MD:  Dr. Wyvonnia Dusky, ER, CHIEF COMPLAINT:  Lower GI bleeding   Brief History   74 yo male presented from rehab center with abdominal pain and maroon colored stool.  PCCM asked to admit to ICU.  He had CT abd/pelvis 12/06/19 that showed possible cecal mass.  He is DNR/DNI.  Opted against any endoscopic procedures.  Past Medical History  Combined CHF with EF 25%, s/p AICD, Severe COPD/emphysema, chronic respiratory failure on 5 liters, Permanent A fib on ASA, ICH, mitral regurg s/p bioprosthetic MVR, NSCLC s/p wedge resection, DM type 2, HTN, HLD  Significant Hospital Events   6/15 Admit  Consults:  Gastroenterology >> s/o 6/15  Procedures:    Significant Diagnostic Tests:  CT angio chest/abd/pelvis 12/06/19 >> severe centrilobular and paraseptal emphysema, mod b/l effusions, LUL wedge resection, changes of liver cirrhosis, mass like appearance in cecum CT angio abd/pelvis 12/24/19 >> no source of GI bleeding identified  Micro Data:  SARS CoV2 6/15 >> negative  Antimicrobials:     Interim history/subjective:  Feels hungry.  Denies chest/abd pain.  Objective   Blood pressure (!) 93/44, pulse 73, temperature 98.1 F (36.7 C), temperature source Oral, resp. rate 13, height 6\' 1"  (1.854 m), weight 61.7 kg, SpO2 100 %.        Intake/Output Summary (Last 24 hours) at 12/25/2019 8250 Last data filed at 12/25/2019 0600 Gross per 24 hour  Intake 1538.5 ml  Output 1600 ml  Net -61.5 ml   Filed Weights   12/24/19 1052 12/25/19 0500  Weight: 61.7 kg 61.7 kg    Examination:  General - alert Eyes - pupils reactive ENT - no sinus tenderness, no stridor Cardiac - irregular, 2/6 SM Chest - equal breath sounds b/l, no wheezing or rales Abdomen - soft, non tender, + bowel sounds Extremities - decreased muscle bulk Skin - no rashes Neuro - normal  strength, moves extremities, follows commands Psych - normal mood and behavior  Resolved Hospital Problem list     Assessment & Plan:   Lower GI bleeding with concern for cecal mass noted on CT abd/pelvis 12/06/19. - he has decided not to pursue endoscopic procedures, and would rather focus on conservative management - f/u CBC - f/u Hb and transfuse if < 7 or significant bleeding - advance diet as tolerated  Chronic combined CHF, permanent A fib, WHO group 2 and 3 pulmonary HTN, HLD. - continue digoxin, lipitor - hold outpt ASA, lasix  Severe COPD/emphysema. Chronic hypoxic respiratory failure. - scheduled duoneb - goal SpO2 88 to 95% - hold outpt breztri  DM type II poorly controlled with hyperglycemia. - SSI - hold metformin  Hx of Depression. - resume celexa  BPH, urine retention. - bethanechol, tamsuolsin  Deconditioning. - PT/OT assessment  Goals of care. - DNR  Best practice:  Diet: regular diet DVT prophylaxis: SCDs GI prophylaxis: Protonix Mobility: OOB to chair Code Status: DNR Disposition: to telemetry 6/16  Transfer to [facility] 6/17 and PCCM off  Labs    CMP Latest Ref Rng & Units 12/25/2019 12/24/2019 12/16/2019  Glucose 70 - 99 mg/dL 151(H) 186(H) -  BUN 8 - 23 mg/dL 20 33(H) 33(A)  Creatinine 0.61 - 1.24 mg/dL 0.50(L) 0.65 -  Sodium 135 - 145 mmol/L 134(L) 137 144  Potassium 3.5 - 5.1 mmol/L 4.9 3.6 3.8  Chloride 98 - 111 mmol/L 97(L) 104  101  CO2 22 - 32 mmol/L 28 26 30(A)  Calcium 8.9 - 10.3 mg/dL 8.2(L) 6.9(L) 9.2  Total Protein 6.5 - 8.1 g/dL - 4.7(L) -  Total Bilirubin 0.3 - 1.2 mg/dL - 0.5 -  Alkaline Phos 38 - 126 U/L - 53 -  AST 15 - 41 U/L - 11(L) -  ALT 0 - 44 U/L - 8 -    CBC Latest Ref Rng & Units 12/25/2019 12/24/2019 12/24/2019  WBC 4.0 - 10.5 K/uL 7.6 - -  Hemoglobin 13.0 - 17.0 g/dL 7.7(L) 7.6(L) 8.1(L)  Hematocrit 39 - 52 % 25.3(L) 25.4(L) 26.6(L)  Platelets 150 - 400 K/uL 181 - -    CBG (last 3)  Recent Labs     12/24/19 1920 12/24/19 2320 12/25/19 0357  GLUCAP 110* 118* 143*    Signature:  Chesley Mires, MD [place] Pager - [phone] 12/25/2019, 8:08 AM

## 2019-12-26 LAB — MAGNESIUM: Magnesium: 2.1 mg/dL (ref 1.7–2.4)

## 2019-12-26 LAB — COMPREHENSIVE METABOLIC PANEL
ALT: 9 U/L (ref 0–44)
AST: 10 U/L — ABNORMAL LOW (ref 15–41)
Albumin: 2.9 g/dL — ABNORMAL LOW (ref 3.5–5.0)
Alkaline Phosphatase: 66 U/L (ref 38–126)
Anion gap: 6 (ref 5–15)
BUN: 20 mg/dL (ref 8–23)
CO2: 29 mmol/L (ref 22–32)
Calcium: 8.4 mg/dL — ABNORMAL LOW (ref 8.9–10.3)
Chloride: 99 mmol/L (ref 98–111)
Creatinine, Ser: 0.64 mg/dL (ref 0.61–1.24)
GFR calc Af Amer: >60 mL/min (ref >60)
GFR calc non Af Amer: >60 mL/min (ref >60)
Glucose, Bld: 166 mg/dL — ABNORMAL HIGH (ref 70–99)
Potassium: 4.3 mmol/L (ref 3.5–5.1)
Sodium: 134 mmol/L — ABNORMAL LOW (ref 135–145)
Total Bilirubin: 1.1 mg/dL (ref 0.3–1.2)
Total Protein: 5.4 g/dL — ABNORMAL LOW (ref 6.5–8.1)

## 2019-12-26 LAB — PHOSPHORUS: Phosphorus: 3.4 mg/dL (ref 2.5–4.6)

## 2019-12-26 LAB — CBC
HCT: 24.7 % — ABNORMAL LOW (ref 39.0–52.0)
Hemoglobin: 7.5 g/dL — ABNORMAL LOW (ref 13.0–17.0)
MCH: 26.5 pg (ref 26.0–34.0)
MCHC: 30.4 g/dL (ref 30.0–36.0)
MCV: 87.3 fL (ref 80.0–100.0)
Platelets: 186 10*3/uL (ref 150–400)
RBC: 2.83 MIL/uL — ABNORMAL LOW (ref 4.22–5.81)
RDW: 22.1 % — ABNORMAL HIGH (ref 11.5–15.5)
WBC: 7.6 10*3/uL (ref 4.0–10.5)
nRBC: 0 % (ref 0.0–0.2)

## 2019-12-26 LAB — GLUCOSE, CAPILLARY
Glucose-Capillary: 136 mg/dL — ABNORMAL HIGH (ref 70–99)
Glucose-Capillary: 192 mg/dL — ABNORMAL HIGH (ref 70–99)
Glucose-Capillary: 201 mg/dL — ABNORMAL HIGH (ref 70–99)
Glucose-Capillary: 223 mg/dL — ABNORMAL HIGH (ref 70–99)

## 2019-12-26 MED ORDER — LACTATED RINGERS IV SOLN: INTRAVENOUS | Status: AC

## 2019-12-26 MED ORDER — OXYCODONE-ACETAMINOPHEN 5-325 MG PO TABS
1.0000 | ORAL_TABLET | ORAL | Status: DC | PRN
  Administered 2019-12-30: 1 via ORAL
  Filled 2019-12-26: qty 1

## 2019-12-26 NOTE — Progress Notes (Signed)
PROGRESS NOTE    [name]  DJM:[id number] DOB: October 30, 1945 DOA: 12/24/2019 PCP: [name], NP   Brief Narrative:  HPI per Dr. Chesley Mires on 12/24/19 74 yo male presented from rehab center with abdominal pain and maroon colored stool.  PCCM asked to admit to ICU.  He had CT abd/pelvis 12/06/19 that showed possible cecal mass.  History of present illness   Denies chest pain, dyspnea.  Has nausea but no vomiting.  Feels weak.  Never had this before.  He is DNR but agreeable to other medical therapies.  Past Medical History  Combined CHF with EF 25%, s/p AICD, Severe COPD/emphysema, chronic respiratory failure on 5 liters, Permanent A fib on ASA, ICH, mitral regurg s/p bioprosthetic MVR, NSCLC s/p wedge resection, DM type 2, HTN, HLD  **Interim History Altered and he had a CT angio of the abdomen pelvis which showed no evidence of any GI bleeding identified.  He continues to have some maroon stools.  He received 2 units of PRBCs.  After discussion with GI patient and the family did now want to pursue colonoscopy and favored conservative management only.  GI signed off the case.  His O2 saturations continued to drop so he had to be bumped up on his oxygen requirement.  Palliative care was consulted for further evaluation recommendations  Assessment & Plan:   Active Problems:   Pressure ulcer   Gastrointestinal hemorrhage   Acute blood loss anemia   Protein-calorie malnutrition, severe  Lower GI bleeding with concern for cecal mass noted on CT abd/pelvis 12/06/19. -He has decided not to pursue endoscopic procedures, and would rather focus on conservative management -Continue  f/u CBC -F/u Hb and transfuse if < 7 or significant bleeding;' he is already transfused 2 2 units of PRBCs -Patient's hemoglobin/hematocrit went from 6.5/22.5 on admission up to 8.1/26.6 and now is trending back down to 7.5/24.7 -He continues to have bloody bowel movements and had one this morning -Advance  diet as tolerated he is now on a regular diet -Have consulted palliative care for further goals of care discussion  Chronic combined systolic and diastolic CHF with an EF of 25 to 30% status post AICD and pacemaker CAD Permanent A fib,  WHO group 2 and 3 pulmonary HTN HLD. -Continue digoxin 0.125 mg p.o. daily, atorvastatin 10 mg p.o. daily - hold outpt ASA, lasix -Currently getting LR at 50 mL's per hour but will start this evening -Continue to monitor on telemetry in the stepdown unit for now -No longer on anticoagulation due to a past ICH  Severe COPD/emphysema with at least 5 L of nasal cannula Acute on chronic hypoxic respiratory failure. History of non-small cell lung cancer status post VATS -C/w scheduled duoneb and with as needed albuterol nebs every 4 hours as needed wheezing - goal SpO2 88 to 95% -His O2 saturations worsened and he has been saturating in the low 80s so we will need to increase his oxygen requirement and monitor carefully - hold outpt breztri -Have consulted palliative care for further goals of care discussion  DM type II poorly controlled with hyperglycemia. - SSI - hold metformin -CBGs have been ranging from 136-242  Hx of Depression. - resume celexa  BPH, urine retention. -C/w bethanechol, tamsuolsin  Deconditioning. - PT/OT further evaluate and treat  History of alcoholic cirrhosis -Has not been drinking for over a decade -Continue monitor LFTs bilirubin  Goals of care. - DNR; Does not want further work up for his GIB -  Will consult Palliative Care for further evaluation and further GOC Discussion  DVT prophylaxis: SCDs given his GI bleeding Code Status: DO NOT RESUSCITATE  Family Communication: No family present at bedside Disposition Plan: Pending further clinical stabilization and improvement of his hypoxia and bleeding.  We have consulted palliative care for goals of care discussion  Status is: Inpatient  Remains inpatient  appropriate because:Unsafe d/c plan, IV treatments appropriate due to intensity of illness or inability to take PO and Inpatient level of care appropriate due to severity of illness   Dispo: The patient is from: Home              Anticipated d/c is to: TBD              Anticipated d/c date is: 2 days              Patient currently is not medically stable to d/c.  Consultants:   Gastroenterology  PCCM Transfer   Procedures: None   Antimicrobials:  Anti-infectives (From admission, onward)   None     Subjective: Seen and evaluated at bedside and he was intermittently desaturating down to the 70s and low 80s.  No nausea or vomiting.  Continues to have some maroon bloody bowel movements.  Felt okay.  Does not want to pursue colonoscopy.  Have called palliative for further goals of care discussion.  No other concerns or plans at this time will need to continue monitor his GI bleeding and ensure stability prior to safe discharge disposition as well as have PT OT evaluate him.  Objective: Vitals:   12/26/19 1200 12/26/19 1300 12/26/19 1400 12/26/19 1430  BP: (!) 129/43 (!) 145/67 (!) 110/47   Pulse: 73 73 74   Resp: (!) 23 (!) 24 16   Temp:  97.6 F (36.4 C)    TempSrc:  Oral    SpO2: (!) 83% (!) 81% 100% 95%  Weight:      Height:        Intake/Output Summary (Last 24 hours) at 12/26/2019 1651 Last data filed at 12/26/2019 0855 Gross per 24 hour  Intake 838.84 ml  Output 1200 ml  Net -361.16 ml   Filed Weights   12/24/19 1052 12/25/19 0500 12/26/19 0500  Weight: 61.7 kg 61.7 kg 61.7 kg   Examination: Physical Exam:  Constitutional: Thin elderly Caucasian male currently in, NAD and appears calm and comfortable sitting in chair bedside eating breakfast but intermittently desaturating Eyes: Lids and conjunctivae normal, sclerae anicteric  ENMT: External Ears, Nose appear normal. Grossly normal hearing.  Neck: Appears normal, supple, no cervical masses, normal ROM, no  appreciable thyromegaly; no JVD Respiratory: Diminished to auscultation bilaterally with coarse breath sounds, no wheezing, rales, rhonchi or crackles. Normal respiratory effort and patient is not tachypenic. No accessory muscle use.  Unlabored breathing but he is desaturating Cardiovascular: RRR, no murmurs / rubs / gallops. S1 and S2 auscultated.  Trace extremity edema Abdomen: Soft, non-tender, non-distended. Bowel sounds positive.  GU: Deferred. Musculoskeletal: No clubbing / cyanosis of digits/nails. No joint deformity upper and lower extremities.  Skin: No rashes, lesions, ulcers on limited skin evaluation. No induration; Warm and dry.  Neurologic: CN 2-12 grossly intact with no focal deficits. Romberg sign and cerebellar reflexes not assessed.  Psychiatric: Normal judgment and insight. Alert and oriented x 3. Normal mood and appropriate affect.   Data Reviewed: I have personally reviewed following labs and imaging studies  CBC: Recent Labs  Lab 12/24/19 0525 12/24/19 1705  12/24/19 2312 12/25/19 0500 12/26/19 0521  WBC 7.9  --   --  7.6 7.6  NEUTROABS 6.7  --   --   --   --   HGB 6.5* 8.1* 7.6* 7.7* 7.5*  HCT 22.5* 26.6* 25.4* 25.3* 24.7*  MCV 83.6  --   --  85.8 87.3  PLT 177  --   --  181 539   Basic Metabolic Panel: Recent Labs  Lab 12/24/19 0525 12/25/19 0500 12/26/19 0756  NA 137 134* 134*  K 3.6 4.9 4.3  CL 104 97* 99  CO2 26 28 29   GLUCOSE 186* 151* 166*  BUN 33* 20 20  CREATININE 0.65 0.50* 0.64  CALCIUM 6.9* 8.2* 8.4*  MG  --   --  2.1  PHOS  --   --  3.4   GFR: Estimated Creatinine Clearance: 72.8 mL/min (by C-G formula based on SCr of 0.64 mg/dL). Liver Function Tests: Recent Labs  Lab 12/24/19 0525 12/26/19 0756  AST 11* 10*  ALT 8 9  ALKPHOS 53 66  BILITOT 0.5 1.1  PROT 4.7* 5.4*  ALBUMIN 2.5* 2.9*   Recent Labs  Lab 12/24/19 0525  LIPASE 25   No results for input(s): AMMONIA in the last 168 hours. Coagulation Profile: Recent Labs    Lab 12/24/19 0525  INR 1.2   Cardiac Enzymes: No results for input(s): CKTOTAL, CKMB, CKMBINDEX, TROPONINI in the last 168 hours. BNP (last 3 results) No results for input(s): PROBNP in the last 8760 hours. HbA1C: No results for input(s): HGBA1C in the last 72 hours. CBG: Recent Labs  Lab 12/25/19 1949 12/25/19 2335 12/26/19 0745 12/26/19 1306 12/26/19 1639  GLUCAP 140* 242* 136* 192* 201*   Lipid Profile: No results for input(s): CHOL, HDL, LDLCALC, TRIG, CHOLHDL, LDLDIRECT in the last 72 hours. Thyroid Function Tests: No results for input(s): TSH, T4TOTAL, FREET4, T3FREE, THYROIDAB in the last 72 hours. Anemia Panel: No results for input(s): VITAMINB12, FOLATE, FERRITIN, TIBC, IRON, RETICCTPCT in the last 72 hours. Sepsis Labs: Recent Labs  Lab 12/24/19 0547  LATICACIDVEN 2.6*    Recent Results (from the past 240 hour(s))  SARS Coronavirus 2 by RT PCR (hospital order, performed in [facility] hospital lab) Nasopharyngeal Nasopharyngeal Swab     Status: None   Collection Time: 12/24/19  7:29 AM   Specimen: Nasopharyngeal Swab  Result Value Ref Range Status   SARS Coronavirus 2 NEGATIVE NEGATIVE Final    Comment: (NOTE) SARS-CoV-2 target nucleic acids are NOT DETECTED.  The SARS-CoV-2 RNA is generally detectable in upper and lower respiratory specimens during the acute phase of infection. The lowest concentration of SARS-CoV-2 viral copies this assay can detect is 250 copies / mL. A negative result does not preclude SARS-CoV-2 infection and should not be used as the sole basis for treatment or other patient management decisions.  A negative result may occur with improper specimen collection / handling, submission of specimen other than nasopharyngeal swab, presence of viral mutation(s) within the areas targeted by this assay, and inadequate number of viral copies (<250 copies / mL). A negative result must be combined with clinical observations, patient history,  and epidemiological information.  Fact Sheet for Patients:   StrictlyIdeas.no  Fact Sheet for Healthcare Providers: BankingDealers.co.za  This test is not yet approved or  cleared by the [place] FDA and has been authorized for detection and/or diagnosis of SARS-CoV-2 by FDA under an Emergency Use Authorization (EUA).  This EUA will remain in effect (meaning  this test can be used) for the duration of the COVID-19 declaration under Section 564(b)(1) of the Act, 21 U.S.C. section 360bbb-3(b)(1), unless the authorization is terminated or revoked sooner.  Performed at [facility], [place] [address], [place], [place] [address]   MRSA PCR Screening     Status: None   Collection Time: 12/24/19 11:05 AM   Specimen: Nasal Mucosa; Nasopharyngeal  Result Value Ref Range Status   MRSA by PCR NEGATIVE NEGATIVE Final    Comment:        The GeneXpert MRSA Assay (FDA approved for NASAL specimens only), is one component of a comprehensive MRSA colonization surveillance program. It is not intended to diagnose MRSA infection nor to guide or monitor treatment for MRSA infections. Performed at [facility], [place] [address] [place], [place] [address]      RN Pressure Injury Documentation: Pressure Injury 12/08/19 Coccyx Mid;Right;Left Stage 1 -  Intact skin with non-blanchable redness of a localized area usually over a bony prominence. redness or discoloration on coccyx (Active)  12/08/19 0700  Location: Coccyx  Location Orientation: Mid;Right;Left  Staging: Stage 1 -  Intact skin with non-blanchable redness of a localized area usually over a bony prominence.  Wound Description (Comments): redness or discoloration on coccyx  Present on Admission: Yes     Pressure Injury 12/24/19 Coccyx Left;Right Stage 1 -  Intact skin with non-blanchable redness of a localized area usually over a bony prominence. (Active)   12/24/19   Location: Coccyx  Location Orientation: Left;Right  Staging: Stage 1 -  Intact skin with non-blanchable redness of a localized area usually over a bony prominence.  Wound Description (Comments):   Present on Admission: Yes     Estimated body mass index is 17.95 kg/m as calculated from the following:   Height as of this encounter: 6\' 1"  (1.854 m).   Weight as of this encounter: 61.7 kg.  Malnutrition Type:  Nutrition Problem: Severe Malnutrition Etiology: chronic illness (COPD and lung cancer)   Malnutrition Characteristics:  Signs/Symptoms: moderate fat depletion, moderate muscle depletion, severe fat depletion, severe muscle depletion   Nutrition Interventions:  Interventions: Boost Breeze, Magic cup, MVI, Other (Comment) [employer])   Radiology Studies: No results found.  Scheduled Meds: . atorvastatin  10 mg Oral Daily  . bethanechol  10 mg Oral TID  . Chlorhexidine Gluconate Cloth  6 each Topical Daily  . citalopram  10 mg Oral Daily  . digoxin  0.125 mg Oral Daily  . feeding supplement  1 Container Oral Q24H  . feeding supplement ([employer] [place] STANDARD 1.4)  325 mL Oral Q24H  . insulin aspart  0-5 Units Subcutaneous QHS  . insulin aspart  0-9 Units Subcutaneous TID WC  . ipratropium-albuterol  3 mL Nebulization TID  . mouth rinse  15 mL Mouth Rinse BID  . pantoprazole  40 mg Oral Daily  . tamsulosin  0.4 mg Oral QPC supper  . cyanocobalamin  1,000 mcg Oral Daily   Continuous Infusions: . lactated ringers 50 mL/hr at 12/26/19 0855    LOS: 2 days   Kerney Elbe, DO [facility] PAGER is on AMION  If 7PM-7AM, please contact night-coverage [email or web address]

## 2019-12-26 NOTE — TOC Initial Note (Signed)
Transition of Care [facility]) - Initial/Assessment Note    Patient Details  Name: [name] MRN: [id number] Date of Birth: [birth year]  [facility] CM/SW Contact:    Leeroy Cha, RN Phone Number: 12/26/2019, 8:28 AM  Clinical Narrative:                 Fabienne Bruns bleed with maroon stools hgb 7.5 Plan to return to [facility] rehab. Expected Discharge Plan: Skilled Nursing Facility Barriers to Discharge: Continued Medical Work up   Patient Goals and CMS Choice Patient states their goals for this hospitalization and ongoing recovery are:: to go back to [facility]      Expected Discharge Plan and Services Expected Discharge Plan: [place]   Discharge Planning Services: CM Consult Post Acute Care Choice: [place] Living arrangements for the past 2 months: [place]                                      Prior Living Arrangements/Services Living arrangements for the past 2 months: [place] Lives with:: Facility Resident Patient language and need for interpreter reviewed:: Yes Do you feel safe going back to the place where you live?: Yes (to [facility])   snf  Need for Family Participation in Patient Care: Yes (Comment) Care giver support system in place?: Yes (comment)   Criminal Activity/Legal Involvement Pertinent to Current Situation/Hospitalization: No - Comment as needed  Activities of Daily Living Home Assistive Devices/Equipment: Eyeglasses, CBG Meter, Hearing aid (hearing aids but dont work) ADL Screening (condition at time of admission) Patient's cognitive ability adequate to safely complete daily activities?: Yes Is the patient deaf or have difficulty hearing?: Yes Does the patient have difficulty seeing, even when wearing glasses/contacts?: No Does the patient have difficulty concentrating, remembering, or making decisions?: No Patient able to express need for assistance with ADLs?:  Yes Does the patient have difficulty dressing or bathing?: Yes Independently performs ADLs?: No Communication: Independent Dressing (OT): Needs assistance Is this a change from baseline?: Pre-admission baseline Grooming: Needs assistance Is this a change from baseline?: Pre-admission baseline Feeding: Independent Bathing: Needs assistance Is this a change from baseline?: Pre-admission baseline Toileting: Needs assistance Is this a change from baseline?: Pre-admission baseline In/Out Bed: Needs assistance Is this a change from baseline?: Pre-admission baseline Walks in Home: Independent with device (comment), Needs assistance Is this a change from baseline?: Pre-admission baseline Does the patient have difficulty walking or climbing stairs?: Yes Weakness of Legs: Both Weakness of Arms/Hands: None  Permission Sought/Granted Permission sought to share information with : Case Manager             Permission granted to share info w Contact Information: judy [phone]  Emotional Assessment Appearance:: Appears stated age     Orientation: : Oriented to Self, Oriented to Place, Oriented to  Time, Oriented to Situation Alcohol / Substance Use: Not Applicable Psych Involvement: No (comment)  Admission diagnosis:  Hemorrhagic shock ([place]) [R57.8] Acute lower GI bleeding [K92.2] Weakness [R53.1] Gastrointestinal hemorrhage, unspecified gastrointestinal hemorrhage type [K92.2] Patient Active Problem List   Diagnosis Date Noted  . Protein-calorie malnutrition, severe 12/25/2019  . Gastrointestinal hemorrhage 12/24/2019  . Acute blood loss anemia   . Pressure ulcer 12/08/2019  . Depression, recurrent ([place])   . Acute respiratory failure with hypoxia ([place])   . Goals of care, counseling/discussion   . Palliative  care by specialist   . Acute exacerbation of CHF (congestive heart failure) ([place]) 11/13/2019  . Elevated troponin   . Chronic atrial fibrillation ([place])   . Weakness  generalized   . DNR (do not resuscitate)   . Acute on chronic combined systolic (congestive) and diastolic (congestive) heart failure ([place]) 04/27/2019  . COPD with acute exacerbation ([place]) 08/01/2018  . Pulmonary edema 07/30/2018  . Health care maintenance 05/09/2018  . Gastritis and gastroduodenitis   . Diverticulum of duodenum   . Aortic root dilatation ([place])   . 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 Atherosclerosis of aorta ([place]) 08/11/2016  . Orthostatic hypotension 09/26/2015  . Near syncope 09/24/2015  . Syncope 09/24/2015  . Hepatic cirrhosis ([place]) 09/25/2014  . History of colonic polyps 09/25/2014  . Serum total bilirubin elevated 06/30/2014  . Follow-up examination, following unspecified surgery 01/27/2014  . Lung cancer ([place]) 11/29/2013  . Prosthetic valve dysfunction   . Chronic combined systolic and diastolic CHF (congestive heart failure) ([place])   . Type 2 diabetes mellitus ([place]) 11/04/2013  . Myocardial infarction ([place]) 11/01/2013  . Cardiomyopathy, nonischemic ([place]) 11/01/2013  . Acute on chronic respiratory failure with hypoxia ([place]) 08/15/2013  . H/O intracranial hemorrhage 08/13/2013  . H/O endocarditis 08/13/2013  . H/O: CVA (cerebrovascular accident) 08/13/2013  . Community acquired pneumonia 08/13/2013  . Chronic respiratory failure assoc with chf/ PAH 08/12/2013  . H/O atrioventricular nodal ablation   . S/P MVR (mitral valve replacement)   . Hypertension associated with diabetes ([place])   . Hyperlipidemia associated with type 2 diabetes mellitus ([place])   . Permanent atrial fibrillation   . Ejection fraction < 50%   . ICD (implantable cardioverter-defibrillator), biventricular, in situ   . Renal artery stenosis ([place])   . Pulmonary hypertension ([place])   . Primary cancer of left upper lobe of lung ([place]) 04/20/2009  . COLONIC POLYPS, ADENOMATOUS 03/22/2007  . COPD GOLD II 01/11/2007  . GERD 01/11/2007   PCP:  [name], NP Pharmacy:   [id number] - [place], [place] [address] Phone: [phone] Fax: [phone]     Social Determinants of Health (SDOH) Interventions    Readmission Risk Interventions Readmission Risk Prevention Plan 11/25/2019 11/19/2019  Transportation Screening Complete Complete  PCP or Specialist Appt within 3-5 Days Complete Complete  HRI or [place] Complete Complete  Social Work Consult for [place] Planning/Counseling Complete Complete  Palliative Care Screening Complete Complete  Medication Review Press photographer) Complete Complete  Some recent data might be hidden

## 2019-12-26 NOTE — Evaluation (Signed)
Occupational Therapy Evaluation Patient Details Name: [name] MRN: [id number] DOB: 1946-02-15 Today's Date: 12/26/2019    History of Present Illness 74 yo male presented from rehab center with abdominal pain and maroon colored stool.  PCCM asked to admit to ICU.  Recent admission at [facility] where pt had CT abd/pelvis 12/06/19 that showed possible cecal mass. Pt DCed to [facility].   He is DNR/DNI.  Opted against any endoscopic procedures. PMH of CHF, COPD on 5L O2 baseline, a fib, lung cancer s/p wedge resection, DM2, HTN, HLD   Clinical Impression   [name] is a 74 year old man admitted for GI bleed and multiple medical issues currently on 4L HFNC. On evaluation patient presents with generalized weakness, decreased activity tolerance and mild balance impairments resulting in decreased independence to perform mobility and ADLs. Patient's HR WFL during evaluation but o2 sats dropping with minimal activity - though patient primarily unsymptomatic. RN increased HFNC to 6 liters after transfer to the chair. Patient will benefit from skilled OT services to improve deficits and return to PLOF. Patient admitted from SNF and wants to return to rehab at discharge.     Follow Up Recommendations  SNF    Equipment Recommendations   (TBD)    Recommendations for Other Services       Precautions / Restrictions Precautions Precautions: ICD/Pacemaker;Fall Precaution Comments: watch SPO2, on O2 at baseline Restrictions Weight Bearing Restrictions: No      Mobility Bed Mobility Overal bed mobility: Needs Assistance Bed Mobility: Rolling;Supine to Sit Rolling: Supervision   Supine to sit: HOB elevated;Min assist     General bed mobility comments: Min assist to assist with lower extremities to transfer to side of bed.  Transfers Overall transfer level: Needs assistance Equipment used: Rolling walker (2 wheeled) Transfers: Sit to/from Stand Sit to Stand: Min assist Stand pivot  transfers: Min assist       General transfer comment: Min assist with Rw to take steps to reclner. Limited by dropping o2 sats. Patient's o2 sats rangnig from low 90s and dropping to 60s-80s with good wave form on monitor but patient unsymptomatic and would increase to high 80s within seconds. RN oberving from outside of room and increased HFNC from 4L to 6L    Balance Overall balance assessment: Needs assistance Sitting-balance support: Feet supported;No upper extremity supported Sitting balance-Leahy Scale: Fair     Standing balance support: Single extremity supported;During functional activity Standing balance-Leahy Scale: Fair Standing balance comment: BUE on walker with standing.                           ADL either performed or assessed with clinical judgement   ADL   Eating/Feeding: Independent;Sitting   Grooming: Wash/dry hands;Supervision/safety;Sitting   Upper Body Bathing: Set up;Sitting   Lower Body Bathing: Minimal assistance;Sit to/from stand;Set up;Cueing for safety   Upper Body Dressing : Set up;Sitting   Lower Body Dressing: Moderate assistance;Sit to/from stand;Set up   Toilet Transfer: Minimal assistance;RW;Stand-pivot;BSC   Toileting- Clothing Manipulation and Hygiene: Maximal assistance;Sit to/from stand   Tub/ Banker:  (n/a)   Functional mobility during ADLs: Minimal assistance;Rolling walker General ADL Comments: Patient assisted with percare while in bed due to presence of BM when therapist entered the room and to limit mess with transfers. Dark bloody stool noted and RN notified. Patient demonstrates ability to perform sit to stand for toileting - limiting standing tolerance secondary to poor cardiopulmonary  endurance and max assist for managing clothing and pericare secondary to need to hold onto walker.     Vision Baseline Vision/History: Wears glasses Wears Glasses: At all times Patient Visual Report: No change from  baseline Vision Assessment?: No apparent visual deficits     Perception     Praxis      Pertinent Vitals/Pain Pain Assessment: No/denies pain     Hand Dominance Left   Extremity/Trunk Assessment Upper Extremity Assessment Upper Extremity Assessment: Overall WFL for tasks assessed   Lower Extremity Assessment Lower Extremity Assessment: Defer to PT evaluation   Cervical / Trunk Assessment Cervical / Trunk Assessment: Kyphotic   Communication Communication Communication: HOH   Cognition Arousal/Alertness: Awake/alert Behavior During Therapy: WFL for tasks assessed/performed Overall Cognitive Status: Within Functional Limits for tasks assessed                                     General Comments       Exercises     Shoulder Instructions      Home Living Family/patient expects to be discharged to:: Skilled nursing facility Living Arrangements: Spouse/significant other Available Help at Discharge: Family Type of Home: House Home Access: Stairs to enter Technical brewer of Steps: 3 Entrance Stairs-Rails: Can reach both Home Layout: One level     Bathroom Shower/Tub: Teacher, early years/pre: Standard Bathroom Accessibility: Yes   Home Equipment: Shower seat;Grab bars - tub/shower;Grab bars - toilet   Additional Comments: Wears 5L O2 at home      Prior Functioning/Environment Level of Independence: Needs assistance    ADL's / Homemaking Assistance Needed: Independent prior to needing to go to SNF.   Comments: Reports being at [facility] for rehab prior to admit. Expects to return to rehab at discharge.        OT Problem List: Decreased strength;Decreased activity tolerance;Impaired balance (sitting and/or standing);Decreased safety awareness;Cardiopulmonary status limiting activity;Pain      OT Treatment/Interventions: Self-care/ADL training;Therapeutic exercise;Energy conservation;DME and/or AE instruction;Therapeutic  activities;Balance training    OT Goals(Current goals can be found in the care plan section) Acute Rehab OT Goals Patient Stated Goal: Return to rehab OT Goal Formulation: With patient Time For Goal Achievement: 01/09/20 Potential to Achieve Goals: Fair  OT Frequency: Min 2X/week   Barriers to D/C:            Co-evaluation              AM-PAC OT "6 Clicks" Daily Activity     Outcome Measure Help from another person eating meals?: None Help from another person taking care of personal grooming?: A Little Help from another person toileting, which includes using toliet, bedpan, or urinal?: A Lot Help from another person bathing (including washing, rinsing, drying)?: A Little Help from another person to put on and taking off regular upper body clothing?: A Little Help from another person to put on and taking off regular lower body clothing?: A Lot 6 Click Score: 17   End of Session Equipment Utilized During Treatment: Gait belt;Oxygen;Rolling walker Nurse Communication: Mobility status (o2 sats)  Activity Tolerance: Patient tolerated treatment well;Other (comment) (cardiopumonary endurance.) Patient left: in chair;with call bell/phone within reach;with chair alarm set  OT Visit Diagnosis: Unsteadiness on feet (R26.81);Other abnormalities of gait and mobility (R26.89);Muscle weakness (generalized) (M62.81)                Time: 5885-0277 OT  Time Calculation (min): 22 min Charges:  OT Evaluation $OT Eval Moderate Complexity: 1 Mod  [name], OTR/L [place]  Office [phone] Pager: [phone]   [name] 12/26/2019, 10:03 AM

## 2019-12-27 ENCOUNTER — Inpatient Hospital Stay (HOSPITAL_COMMUNITY): Payer: Medicare Other

## 2019-12-27 LAB — GLUCOSE, CAPILLARY
Glucose-Capillary: 146 mg/dL — ABNORMAL HIGH (ref 70–99)
Glucose-Capillary: 199 mg/dL — ABNORMAL HIGH (ref 70–99)
Glucose-Capillary: 216 mg/dL — ABNORMAL HIGH (ref 70–99)
Glucose-Capillary: 171 mg/dL — ABNORMAL HIGH (ref 70–99)

## 2019-12-27 LAB — CBC WITH DIFFERENTIAL/PLATELET
Abs Immature Granulocytes: 0.07 10*3/uL (ref 0.00–0.07)
Basophils Absolute: 0 10*3/uL (ref 0.0–0.1)
Basophils Relative: 0 %
Eosinophils Absolute: 0.1 10*3/uL (ref 0.0–0.5)
Eosinophils Relative: 0 %
HCT: 26 % — ABNORMAL LOW (ref 39.0–52.0)
Hemoglobin: 7.8 g/dL — ABNORMAL LOW (ref 13.0–17.0)
Immature Granulocytes: 1 %
Lymphocytes Relative: 3 %
Lymphs Abs: 0.4 10*3/uL — ABNORMAL LOW (ref 0.7–4.0)
MCH: 26.4 pg (ref 26.0–34.0)
MCHC: 30 g/dL (ref 30.0–36.0)
MCV: 87.8 fL (ref 80.0–100.0)
Monocytes Absolute: 1.1 10*3/uL — ABNORMAL HIGH (ref 0.1–1.0)
Monocytes Relative: 10 %
Neutro Abs: 9.8 10*3/uL — ABNORMAL HIGH (ref 1.7–7.7)
Neutrophils Relative %: 86 %
Platelets: 185 10*3/uL (ref 150–400)
RBC: 2.96 MIL/uL — ABNORMAL LOW (ref 4.22–5.81)
RDW: 22.2 % — ABNORMAL HIGH (ref 11.5–15.5)
WBC: 11.4 10*3/uL — ABNORMAL HIGH (ref 4.0–10.5)
nRBC: 0 % (ref 0.0–0.2)

## 2019-12-27 LAB — COMPREHENSIVE METABOLIC PANEL
ALT: 7 U/L (ref 0–44)
AST: 9 U/L — ABNORMAL LOW (ref 15–41)
Albumin: 2.9 g/dL — ABNORMAL LOW (ref 3.5–5.0)
Alkaline Phosphatase: 71 U/L (ref 38–126)
Anion gap: 7 (ref 5–15)
BUN: 24 mg/dL — ABNORMAL HIGH (ref 8–23)
CO2: 28 mmol/L (ref 22–32)
Calcium: 8.5 mg/dL — ABNORMAL LOW (ref 8.9–10.3)
Chloride: 98 mmol/L (ref 98–111)
Creatinine, Ser: 0.88 mg/dL (ref 0.61–1.24)
GFR calc Af Amer: >60 mL/min (ref >60)
GFR calc non Af Amer: >60 mL/min (ref >60)
Glucose, Bld: 191 mg/dL — ABNORMAL HIGH (ref 70–99)
Potassium: 4.5 mmol/L (ref 3.5–5.1)
Sodium: 133 mmol/L — ABNORMAL LOW (ref 135–145)
Total Bilirubin: 0.9 mg/dL (ref 0.3–1.2)
Total Protein: 5.7 g/dL — ABNORMAL LOW (ref 6.5–8.1)

## 2019-12-27 LAB — PHOSPHORUS: Phosphorus: 4.4 mg/dL (ref 2.5–4.6)

## 2019-12-27 LAB — MAGNESIUM: Magnesium: 2.2 mg/dL (ref 1.7–2.4)

## 2019-12-27 NOTE — Progress Notes (Signed)
PROGRESS NOTE    [name]  DVV:[id number] DOB: October 22, 1945 DOA: 12/24/2019 PCP: [name], NP   Brief Narrative:  HPI per Dr. Chesley Mires on 12/24/19 74 yo male presented from rehab center with abdominal pain and maroon colored stool.  PCCM asked to admit to ICU.  He had CT abd/pelvis 12/06/19 that showed possible cecal mass.  History of present illness   Denies chest pain, dyspnea.  Has nausea but no vomiting.  Feels weak.  Never had this before.  He is DNR but agreeable to other medical therapies.  Past Medical History  Combined CHF with EF 25%, s/p AICD, Severe COPD/emphysema, chronic respiratory failure on 5 liters, Permanent A fib on ASA, ICH, mitral regurg s/p bioprosthetic MVR, NSCLC s/p wedge resection, DM type 2, HTN, HLD  **Interim History He had a CT angio of the abdomen pelvis which showed no evidence of any GI bleeding identified.  He continues to have some maroon stools.  He received 2 units of PRBCs.  After discussion with GI patient and the family did now want to pursue colonoscopy and favored conservative management only.  GI signed off the case.  His O2 saturations continued to drop so he had to be bumped up on his oxygen requirement and had to be placed on a NRB last night but now on 10 Liters.  Palliative care was consulted for further evaluation recommendations. He continues to have bloody bowel movements and had 5 BM yesterday. PT recommending SNF when stable to D/C.   Assessment & Plan:   Active Problems:   Pressure ulcer   Gastrointestinal hemorrhage   Acute blood loss anemia   Protein-calorie malnutrition, severe  Lower GI bleeding with concern for cecal mass noted on CT abd/pelvis 12/06/19. -He has decided not to pursue endoscopic procedures, and would rather focus on conservative management -Continue  f/u CBC as he continues to have 5 Bloody BM's  -F/u Hb and transfuse if < 7 or significant bleeding;' he is already transfused  2 units of  PRBCs -Patient's hemoglobin/hematocrit went from 6.5/22.5 on admission up to 8.1/26.6 and now is trending back down to 7.5/24.7 yesterday and today was 7.8/26.0 -He continues to have bloody bowel movements and has 5 since yesterday -Advance diet as tolerated he is now on a regular diet -Have consulted palliative care for further goals of care discussion and awaiting to see the patient. -Continue to Trend CBCs   Chronic combined systolic and diastolic CHF with an EF of 25 to 30% status post AICD and pacemaker CAD Permanent A fib,  WHO group 2 and 3 pulmonary HTN HLD. -Continue digoxin 0.125 mg p.o. daily, atorvastatin 10 mg p.o. daily -hold outpt ASA, lasix -Currently getting LR at 50 mL's per hour but will discontinue now  -Continue to monitor on telemetry in the stepdown unit for now -No longer on anticoagulation due to a past ICH  Severe COPD/emphysema with at least 5 L of nasal cannula Acute on chronic hypoxic respiratory failure, worsening History of non-small cell lung cancer status post VATS -C/w scheduled duoneb and with as needed albuterol nebs every 4 hours as needed wheezing - goal SpO2 88 to 95% -His O2 saturations worsened and he has been saturating in the low 80s so we will need to increase his oxygen requirement and monitor carefully - hold outpt breztri -Have consulted palliative care for further goals of care discussion  DM type II poorly controlled with hyperglycemia. - SSI - hold metformin -CBGs have been  ranging from 146-223  Hx of Depression. - resume Citalopram  BPH, urine retention. -C/w bethanechol, tamsuolsin  Deconditioning. - PT/OT further evaluate and treat and recommending SNF again   History of alcoholic cirrhosis -Has not been drinking for over a decade -Continue monitor LFTs and bilirubin and they are normal  Goals of care. - DNR; Does not want further work up for his GIB - Will consult Palliative Care for further evaluation and  further GOC Discussion  Hyponatremia -Mild at 133 -Continue to Monitor and Trend   Stage 1 Coccyx Ulcer, pOA -Continue Wound Care  DVT prophylaxis: SCDs given his GI bleeding Code Status: DO NOT RESUSCITATE  Family Communication: No family present at bedside Disposition Plan: Pending further clinical stabilization and improvement of his hypoxia and bleeding.  We have consulted palliative care for goals of care discussion and will need to ensure stability prior to D/C  Status is: Inpatient  Remains inpatient appropriate because:Unsafe d/c plan, IV treatments appropriate due to intensity of illness or inability to take PO and Inpatient level of care appropriate due to severity of illness   Dispo: The patient is from: Home              Anticipated d/c is to: TBD              Anticipated d/c date is: 2 days              Patient currently is not medically stable to d/c.  Consultants:   Gastroenterology  PCCM Transfer  Palliative Care    Procedures: None   Antimicrobials:  Anti-infectives (From admission, onward)   None     Subjective: Seen and evaluated at bedside and continues to desaturate and had to be placed on a nonrebreather yesterday.  Is now back on 10 L but continued to have bloody bowel movements and has had 5 since yesterday.  No chest pain, lightheadedness or dizziness.  Does not complain of dyspnea but oxygen saturations continued to drop.  Awaiting palliative care discussion.  Objective: Vitals:   12/27/19 0800 12/27/19 1002 12/27/19 1200 12/27/19 1600  BP: (!) 95/52     Pulse: 77 88    Resp: 11     Temp: 98.4 F (36.9 C)  98.1 F (36.7 C) 97.9 F (36.6 C)  TempSrc: Axillary  Oral Oral  SpO2: 97%     Weight:      Height:        Intake/Output Summary (Last 24 hours) at 12/27/2019 1825 Last data filed at 12/27/2019 0449 Gross per 24 hour  Intake --  Output 1050 ml  Net -1050 ml   Filed Weights   12/25/19 0500 12/26/19 0500 12/27/19 0500   Weight: 61.7 kg 61.7 kg 61.6 kg   Examination: Physical Exam:  Constitutional: Thin elderly Caucasian male currently in, NAD and appears calm recent in a chair at bedside continues to intermittently desaturate Eyes:  Lids and conjunctivae normal, sclerae anicteric  ENMT: External Ears, Nose appear normal. Grossly normal hearing.  Neck: Appears normal, supple, no cervical masses, normal ROM, no appreciable thyromegaly; no JVD Respiratory: Diminished to auscultation bilaterally with coarse breath sounds, no wheezing, rales, rhonchi or crackles. Normal respiratory effort and patient is not tachypenic. No accessory muscle use.  Unlabored breathing but he continues to desaturate and he is on 10 L Cardiovascular: Mildly tachycardic, no murmurs / rubs / gallops. S1 and S2 auscultated.  Slight extremity edema. Abdomen: Soft, non-tender, non-distended. Bowel sounds positive.  GU:  Deferred. Musculoskeletal: No clubbing / cyanosis of digits/nails. No joint deformity upper and lower extremities.  Skin: No rashes, lesions, ulcers on limited skin evaluation. No induration; Warm and dry.  Neurologic: CN 2-12 grossly intact with no focal deficits. Romberg sign and cerebellar reflexes not assessed.  Psychiatric: Normal judgment and insight. Alert and oriented x 3. Normal mood and appropriate affect.   Data Reviewed: I have personally reviewed following labs and imaging studies  CBC: Recent Labs  Lab 12/24/19 0525 12/24/19 0525 12/24/19 1705 12/24/19 2312 12/25/19 0500 12/26/19 0521 12/27/19 0203  WBC 7.9  --   --   --  7.6 7.6 11.4*  NEUTROABS 6.7  --   --   --   --   --  9.8*  HGB 6.5*   < > 8.1* 7.6* 7.7* 7.5* 7.8*  HCT 22.5*   < > 26.6* 25.4* 25.3* 24.7* 26.0*  MCV 83.6  --   --   --  85.8 87.3 87.8  PLT 177  --   --   --  181 186 185   < > = values in this interval not displayed.   Basic Metabolic Panel: Recent Labs  Lab 12/24/19 0525 12/25/19 0500 12/26/19 0756 12/27/19 0203  NA  137 134* 134* 133*  K 3.6 4.9 4.3 4.5  CL 104 97* 99 98  CO2 26 28 29 28   GLUCOSE 186* 151* 166* 191*  BUN 33* 20 20 24*  CREATININE 0.65 0.50* 0.64 0.88  CALCIUM 6.9* 8.2* 8.4* 8.5*  MG  --   --  2.1 2.2  PHOS  --   --  3.4 4.4   GFR: Estimated Creatinine Clearance: 66.1 mL/min (by C-G formula based on SCr of 0.88 mg/dL). Liver Function Tests: Recent Labs  Lab 12/24/19 0525 12/26/19 0756 12/27/19 0203  AST 11* 10* 9*  ALT 8 9 7   ALKPHOS 53 66 71  BILITOT 0.5 1.1 0.9  PROT 4.7* 5.4* 5.7*  ALBUMIN 2.5* 2.9* 2.9*   Recent Labs  Lab 12/24/19 0525  LIPASE 25   No results for input(s): AMMONIA in the last 168 hours. Coagulation Profile: Recent Labs  Lab 12/24/19 0525  INR 1.2   Cardiac Enzymes: No results for input(s): CKTOTAL, CKMB, CKMBINDEX, TROPONINI in the last 168 hours. BNP (last 3 results) No results for input(s): PROBNP in the last 8760 hours. HbA1C: No results for input(s): HGBA1C in the last 72 hours. CBG: Recent Labs  Lab 12/26/19 1639 12/26/19 2207 12/27/19 0834 12/27/19 1153 12/27/19 1639  GLUCAP 201* 223* 146* 199* 216*   Lipid Profile: No results for input(s): CHOL, HDL, LDLCALC, TRIG, CHOLHDL, LDLDIRECT in the last 72 hours. Thyroid Function Tests: No results for input(s): TSH, T4TOTAL, FREET4, T3FREE, THYROIDAB in the last 72 hours. Anemia Panel: No results for input(s): VITAMINB12, FOLATE, FERRITIN, TIBC, IRON, RETICCTPCT in the last 72 hours. Sepsis Labs: Recent Labs  Lab 12/24/19 0547  LATICACIDVEN 2.6*    Recent Results (from the past 240 hour(s))  SARS Coronavirus 2 by RT PCR (hospital order, performed in [facility] hospital lab) Nasopharyngeal Nasopharyngeal Swab     Status: None   Collection Time: 12/24/19  7:29 AM   Specimen: Nasopharyngeal Swab  Result Value Ref Range Status   SARS Coronavirus 2 NEGATIVE NEGATIVE Final    Comment: (NOTE) SARS-CoV-2 target nucleic acids are NOT DETECTED.  The SARS-CoV-2 RNA is generally  detectable in upper and lower respiratory specimens during the acute phase of infection. The lowest concentration of  SARS-CoV-2 viral copies this assay can detect is 250 copies / mL. A negative result does not preclude SARS-CoV-2 infection and should not be used as the sole basis for treatment or other patient management decisions.  A negative result may occur with improper specimen collection / handling, submission of specimen other than nasopharyngeal swab, presence of viral mutation(s) within the areas targeted by this assay, and inadequate number of viral copies (<250 copies / mL). A negative result must be combined with clinical observations, patient history, and epidemiological information.  Fact Sheet for Patients:   StrictlyIdeas.no  Fact Sheet for Healthcare Providers: BankingDealers.co.za  This test is not yet approved or  cleared by the [place] FDA and has been authorized for detection and/or diagnosis of SARS-CoV-2 by FDA under an Emergency Use Authorization (EUA).  This EUA will remain in effect (meaning this test can be used) for the duration of the COVID-19 declaration under Section 564(b)(1) of the Act, 21 U.S.C. section 360bbb-3(b)(1), unless the authorization is terminated or revoked sooner.  Performed at [facility], [place] [address], [place], [place] [address]   MRSA PCR Screening     Status: None   Collection Time: 12/24/19 11:05 AM   Specimen: Nasal Mucosa; Nasopharyngeal  Result Value Ref Range Status   MRSA by PCR NEGATIVE NEGATIVE Final    Comment:        The GeneXpert MRSA Assay (FDA approved for NASAL specimens only), is one component of a comprehensive MRSA colonization surveillance program. It is not intended to diagnose MRSA infection nor to guide or monitor treatment for MRSA infections. Performed at [facility], [place] [address], [place], [place]  [address]      RN Pressure Injury Documentation: Pressure Injury 12/08/19 Coccyx Mid;Right;Left Stage 1 -  Intact skin with non-blanchable redness of a localized area usually over a bony prominence. redness or discoloration on coccyx (Active)  12/08/19 0700  Location: Coccyx  Location Orientation: Mid;Right;Left  Staging: Stage 1 -  Intact skin with non-blanchable redness of a localized area usually over a bony prominence.  Wound Description (Comments): redness or discoloration on coccyx  Present on Admission: Yes     Pressure Injury 12/24/19 Coccyx Left;Right Stage 1 -  Intact skin with non-blanchable redness of a localized area usually over a bony prominence. (Active)  12/24/19   Location: Coccyx  Location Orientation: Left;Right  Staging: Stage 1 -  Intact skin with non-blanchable redness of a localized area usually over a bony prominence.  Wound Description (Comments):   Present on Admission: Yes     Estimated body mass index is 17.92 kg/m as calculated from the following:   Height as of this encounter: 6\' 1"  (1.854 m).   Weight as of this encounter: 61.6 kg.  Malnutrition Type:  Nutrition Problem: Severe Malnutrition Etiology: chronic illness (COPD and lung cancer)   Malnutrition Characteristics:  Signs/Symptoms: moderate fat depletion, moderate muscle depletion, severe fat depletion, severe muscle depletion   Nutrition Interventions:  Interventions: Boost Breeze, Magic cup, MVI, Other (Comment) [employer])   Radiology Studies: No results found.  Scheduled Meds: . atorvastatin  10 mg Oral Daily  . bethanechol  10 mg Oral TID  . Chlorhexidine Gluconate Cloth  6 each Topical Daily  . citalopram  10 mg Oral Daily  . digoxin  0.125 mg Oral Daily  . feeding supplement  1 Container Oral Q24H  . feeding supplement ([place] [place] STANDARD 1.4)  325 mL Oral Q24H  . insulin  aspart  0-5 Units Subcutaneous QHS  . insulin aspart  0-9 Units Subcutaneous TID WC  .  ipratropium-albuterol  3 mL Nebulization TID  . mouth rinse  15 mL Mouth Rinse BID  . pantoprazole  40 mg Oral Daily  . tamsulosin  0.4 mg Oral QPC supper  . cyanocobalamin  1,000 mcg Oral Daily   Continuous Infusions:   LOS: 3 days   Kerney Elbe, DO [facility] PAGER is on [place]  If 7PM-7AM, please contact night-coverage [email or web address]

## 2019-12-27 NOTE — Progress Notes (Signed)
Physical Therapy Treatment Patient Details Name: [name] MRN: [id number] DOB: 1945-09-23 Today's Date: 12/27/2019    History of Present Illness 74 yo male presented from rehab center with abdominal pain and maroon colored stool.  PCCM asked to admit to ICU.  Recent admission at [facility] where pt had CT abd/pelvis 12/06/19 that showed possible cecal mass. Pt DCed to [facility].   He is DNR/DNI.  Opted against any endoscopic procedures. PMH of CHF, COPD on 5L O2 baseline, a fib, lung cancer s/p wedge resection, DM2, HTN, HLD    PT Comments    Patient on 10 L HFNC. BP sup-118/44, after transfer-83/41, SPO2 90-100%, RR 23. Patient found with bloody BM. RN aware. Patient is much weaker than 2 days prior( amb. X 200'). Continue mobility with PT as tolerated.   Follow Up Recommendations  [facility] ([facility]     Equipment Recommendations  None recommended by PT    Recommendations for Other Services       Precautions / Restrictions Precautions Precautions: ICD/Pacemaker;Fall Precaution Comments: watch SPO2, on O2 at baseline, 10 L HFNC, bloody BM    Mobility  Bed Mobility Overal bed mobility: Needs Assistance Bed Mobility: Supine to Sit     Supine to sit: HOB elevated;Supervision     General bed mobility comments: no asisstance  Transfers Overall transfer level: Needs assistance Equipment used: Rolling walker (2 wheeled) Transfers: Sit to/from Stand   Stand pivot transfers: Mod assist       General transfer comment: steady assist to stand from bed, small shuffle steps to recliner on 10 L HFNC,  Ambulation/Gait                 Stairs             Wheelchair Mobility    Modified Rankin (Stroke Patients Only)       Balance Overall balance assessment: Needs assistance Sitting-balance support: Feet supported;No upper extremity supported Sitting balance-Leahy Scale: Fair     Standing balance support: Bilateral upper extremity supported;During  functional activity Standing balance-Leahy Scale: Poor Standing balance comment: BUE on walker with standing.                            Cognition   Behavior During Therapy: Flat affect;WFL for tasks assessed/performed;Impulsive Overall Cognitive Status: Difficult to assess                                 General Comments: answers with few words      Exercises      General Comments        Pertinent Vitals/Pain Pain Assessment: No/denies pain    Home Living                      Prior Function            PT Goals (current goals can now be found in the care plan section) Progress towards PT goals: Not progressing toward goals - comment (is actually weaker)    Frequency    Min 2X/week      PT Plan Frequency needs to be updated    Co-evaluation              AM-PAC PT "6 Clicks" Mobility   Outcome Measure  Help needed turning from your back to your side while in a  flat bed without using bedrails?: None Help needed moving from lying on your back to sitting on the side of a flat bed without using bedrails?: None Help needed moving to and from a bed to a chair (including a wheelchair)?: A Lot Help needed standing up from a chair using your arms (e.g., wheelchair or bedside chair)?: A Lot Help needed to walk in hospital room?: A Lot Help needed climbing 3-5 steps with a railing? : Total 6 Click Score: 15    End of Session Equipment Utilized During Treatment: Oxygen;Gait belt Activity Tolerance: Patient limited by fatigue;Treatment limited secondary to medical complications (Comment) Patient left: with call bell/phone within reach;in chair;with chair alarm set Nurse Communication: Mobility status PT Visit Diagnosis: Other abnormalities of gait and mobility (R26.89);Difficulty in walking, not elsewhere classified (R26.2)     Time: 4621-9471 PT Time Calculation (min) (ACUTE ONLY): 23 min  Charges:  $Therapeutic Activity:  23-37 mins                     Tresa Endo PT Acute Rehabilitation Services Pager [phone] Office [phone]    Claretha Cooper 12/27/2019, 9:13 AM

## 2019-12-27 NOTE — Consult Note (Signed)
Consultation Note Date: 12/27/2019   Patient Name: [name]  DOB: July 18, 1945  MRN: [id number]  Age / Sex: 74 y.o., male  PCP: [name], NP Referring Physician: Kerney Elbe, DO  Reason for Consultation: Establishing goals of care  HPI/Patient Profile: 74 y.o. male  with past medical history of combined CHF with EF 25%, status post AICD, severe COPD/emphysema, chronic respiratory failure on 5 L at skilled facility, permanent A. fib on ASA, intercranial hemorrhage, mitral regurg status post bioprosthetic valve replacement, non-small cell lung cancer status post wedge resection, DM type II, hypertension, hyperlipidemia admitted on 12/24/2019 with abdominal pain and maroon-colored stool.  Of note he recently had CT of abdomen pelvis that showed possible cecal mass.  He has declined consideration for colonoscopy as he been told in the past he would be high risk for any sort of sedating procedure.  Palliative consulted for goals of care..   I met today with [name]. He was awake, alert, and lying in bed in the ICU. He was wearing nasal cannula and was in no acute distress.  I talked with him regarding his clinical situation and his hopes moving forward for his care. We discussed his advanced heart failure, emphysema, increased oxygen requirement, and GI bleed with recent discovery of cecal mass. He reports being hopeful to recover enough to return to rehab to regain as much functional status as possible.  I also called and spoke with his wife, [name]. We discussed his clinical course this admission and over the past several months. She reaffirmed his desire to continue with current interventions and see how he does over the next couple of days. She also affirmed his desire to avoid procedures and they declined again today consideration for colonoscopy. She reports being appreciative of the update, however,  she is very concerned about receiving a bill related to palliative care services. This is something that has been a concern of theirs for some time per chart review. She reports she is open to further conversation with me and our team, but only if she would not have a co-pay associated with it.   SUMMARY OF RECOMMENDATIONS   - DNR/DNI - Continue current interventions.  He reports being hopeful to get well enough to go back to rehab to see how much functional strength can be regained. - Discussed also with his wife via phone regarding concern about how he will continue to do with rectal bleeding (although hemoglobin is stable today) and increased oxygen requirements.  She is planning to come to visit tomorrow and asked to sit down and discuss further at that point in time.  I plan to follow with Mr. Haque and his wife tomorrow between 72 and 77 AM.  Code Status/Advance Care Planning:  DNR  Prognosis:   Guarded  Discharge Planning: [place] for rehab with Palliative care service follow-up      Primary Diagnoses: Present on Admission: . Pressure ulcer   I have reviewed the medical record, interviewed the patient and family,  and examined the patient. The following aspects are pertinent.  Past Medical History:  Diagnosis Date  . Atrial septal defect    Closed with surgery January, 2010  . Automatic implantable cardioverter-defibrillator in situ    LV dysfunction and pacer needed for AV node lesion  . Chronic combined systolic and diastolic CHF (congestive heart failure) ([place])   . Colon polyps   . COPD (chronic obstructive pulmonary disease) (HCC)    O2- 2 liters, nasal cannula, q night   . CVA (cerebral vascular accident) ([place]) 2009   denies residual on 08/14/2013  . Diabetes mellitus without complication ([place])    type 2  . Dyslipidemia   . Endocarditis    Bacterial, 2009  . Hypertension   . Intracranial hemorrhage (HCC)    Coumadin cannot be used because of the  history of his bleed  . Lung cancer ([place]) 11/29/2013   T1N0 Stage Ia non-small cell carcinoma left lung treated with wedge resection  . Myocardial infarction ([place]) 2010  . Pacemaker    combo pacer and icd  . Permanent atrial fibrillation    Originally Coumadin use for atrial fibrillation  //   he had intracerebral hemorrhage with an INR of 2.3 June, 2009. Anticoagulation could no longer be used.  //  Rapid atrial fibrillation after inferior MI October, 2010..........Marland Kitchen AV node ablation done at that time with ICD pacemaker placed (EF 35%).   //   Left atrial appendage tied off at the time of mitral valve surgery January, 2010 (maze pro  . Pneumonia 07/2018  . Pulmonary hypertension (HCC)    Moderate  . Renal artery stenosis (HCC)    Mild by history  . 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 Spontaneous pneumothorax    right thoracotomy - distant past  . Status post minimally invasive mitral valve replacement with bioprosthetic valve    33 mm Medtronic Mosaic porcine bioprosthesis placed via right mini thoracotomy for bacterial endocarditis complicated by severe MR and CHF   . Thoracic aortic aneurysm ([place]) 08/11/2016   a - Chest CTA 1/18:  Aneurysmal dilatation of aortic root is noted at 5.1 cm.    Social History   Socioeconomic History  . Marital status: Married    Spouse name: [name]  . Number of children: 2  . Years of education: College  . Highest education level: Not on file  Occupational History  . 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 Smoking status: Former Smoker    Packs/day: 1.00    Years: 45.00    Pack years: 45.00    Types: Cigarettes    Quit date: 2011    Years since quitting: 10.4  . Smokeless tobacco: Never Used  Vaping Use  . Vaping Use: Never used  Substance and Sexual Activity  . Alcohol use: No    Alcohol/week: 0.0 standard drinks    Comment: 08/14/2013 "used to drink beer; quit:in 1982"  . Drug use: No  . Sexual  activity: Yes  Other Topics Concern  . Not on file  Social History Narrative   Patient lives at home with his spouse.   Worked for the post office   Has two boys and a girl. All live local.    1 Mining engineer   Social Determinants of Health   Financial Resource Strain: Low Risk   . Difficulty of Paying Living Expenses: Not hard at all  Food Insecurity:  No Food Insecurity  . Worried About Charity fundraiser in the Last Year: Never true  . Ran Out of Food in the Last Year: Never true  Transportation Needs: No Transportation Needs  . Lack of Transportation (Medical): No  . Lack of Transportation (Non-Medical): No  Physical Activity: Inactive  . Days of Exercise per Week: 0 days  . Minutes of Exercise per Session: 0 min  Stress: No Stress Concern Present  . Feeling of Stress : Only a little  Social Connections: Unknown  . Frequency of Communication with Friends and Family: More than three times a week  . Frequency of Social Gatherings with Friends and Family: Three times a week  . Attends Religious Services: Not on file  . Active Member of Clubs or Organizations: Not on file  . Attends Archivist Meetings: Not on file  . Marital Status: Married   Family History  Problem Relation Age of Onset  . Stomach cancer Father   . Stroke Mother    Scheduled Meds: . atorvastatin  10 mg Oral Daily  . bethanechol  10 mg Oral TID  . Chlorhexidine Gluconate Cloth  6 each Topical Daily  . citalopram  10 mg Oral Daily  . digoxin  0.125 mg Oral Daily  . feeding supplement  1 Container Oral Q24H  . feeding supplement (KATE FARMS STANDARD 1.4)  325 mL Oral Q24H  . insulin aspart  0-5 Units Subcutaneous QHS  . insulin aspart  0-9 Units Subcutaneous TID WC  . ipratropium-albuterol  3 mL Nebulization TID  . mouth rinse  15 mL Mouth Rinse BID  . pantoprazole  40 mg Oral Daily  . tamsulosin  0.4 mg Oral QPC supper  . cyanocobalamin  1,000 mcg Oral Daily   Continuous  Infusions: PRN Meds:.albuterol, ondansetron (ZOFRAN) IV, oxyCODONE-acetaminophen Medications Prior to Admission:  Prior to Admission medications   Medication Sig Start Date End Date Taking? Authorizing Provider  acetaminophen (TYLENOL) 500 MG tablet Take 2 tablets (1,000 mg total) by mouth every 8 (eight) hours. 11/25/19 01/24/20 Yes Mercy Riding, MD  aspirin EC 81 MG tablet Take 1 tablet (81 mg total) by mouth daily. 12/07/12  Yes Carlena Bjornstad, MD  atorvastatin (LIPITOR) 10 MG tablet Take 10 mg by mouth daily.   Yes [provider]  bethanechol (URECHOLINE) 10 MG tablet Take 1 tablet (10 mg total) by mouth 3 (three) times daily. 12/03/19  Yes Nafziger, Tommi Rumps, NP  Budeson-Glycopyrrol-Formoterol (BREZTRI AEROSPHERE) 160-9-4.8 MCG/ACT AERO Inhale 2 puffs into the lungs in the morning and at bedtime. 11/08/19  Yes Martyn Ehrich, NP  citalopram (CELEXA) 10 MG tablet Take 1 tablet (10 mg total) by mouth daily. 12/12/19  Yes Antonieta Pert, MD  digoxin (LANOXIN) 0.125 MG tablet Take 1 tablet (0.125 mg total) by mouth daily. 12/12/19  Yes Antonieta Pert, MD  furosemide (LASIX) 80 MG tablet Take 1 tablet (80 mg total) by mouth 2 (two) times daily. 12/12/19  Yes Antonieta Pert, MD  levalbuterol (XOPENEX) 0.63 MG/3ML nebulizer solution Take 3 mLs (0.63 mg total) by nebulization 2 (two) times daily. 12/12/19  Yes Antonieta Pert, MD  metFORMIN (GLUCOPHAGE) 1000 MG tablet TAKE 1 TABLET BY MOUTH TWICE A DAY WITH MEALS Patient taking differently: Take 1,000 mg by mouth 2 (two) times daily with a meal.  12/05/19  Yes Nafziger, Tommi Rumps, NP  nitroGLYCERIN (NITROSTAT) 0.4 MG SL tablet Place 0.4 mg under the tongue every 5 (five) minutes x 3 doses as needed  for chest pain.    Yes [provider]  pantoprazole (PROTONIX) 40 MG tablet Take 1 tablet (40 mg total) by mouth daily at 6 (six) AM. 12/12/19  Yes Kc, Ramesh, MD  potassium chloride SA (KLOR-CON) 20 MEQ tablet Take 2 tablets (40 mEq total) by mouth daily. 08/12/19 12/24/19  Yes Nafziger, Tommi Rumps, NP  tamsulosin (FLOMAX) 0.4 MG CAPS capsule Take 1 capsule (0.4 mg total) by mouth daily after supper. 12/03/19  Yes Nafziger, Tommi Rumps, NP  vitamin B-12 1000 MCG tablet Take 1 tablet (1,000 mcg total) by mouth daily. 11/25/19  Yes Mercy Riding, MD   Allergies  Allergen Reactions  . Anticoagulant Compound Other (See Comments)    Pt had intracranial bleed, therefore all anticoagulation is contraindicated per Dr. Ron Parker  . Other Other (See Comments)    Per Dr. Halford Chessman (Surgeon): stated that the patient cannot be put under for any surgery, as he has an enlarged aorta. He would stand only a 50/50 chance of surviving. He has lung issues, diminished lung tissue, COPD, and emphysema.  . Warfarin Sodium Other (See Comments)    Pt had intracranial bleed, therefore all anticoagulation is contraindicated per Dr. Ron Parker   Review of Systems  Constitutional: Positive for fatigue.  Gastrointestinal: Positive for blood in stool.  Neurological: Positive for weakness.  Psychiatric/Behavioral: Positive for sleep disturbance.   Physical Exam General: Alert, awake, in no acute distress.  HEENT: No bruits, no goiter, no JVD Heart: Regular rate and rhythm. No murmur appreciated. Lungs: Decreased air movement, coarse scattered  Abdomen: Soft, nontender, nondistended, positive bowel sounds.  Ext: No significant edema Skin: Warm and dry Neuro: Grossly intact, nonfocal.  Vital [name]: BP (!) 95/52   Pulse 88   Temp 97.9 F (36.6 C) (Oral)   Resp 11   Ht 6' 1"  (1.854 m)   Wt 61.6 kg   SpO2 97%   BMI 17.92 kg/m  Pain Scale: 0-10   Pain Score: 0-No pain   SpO2: SpO2: 97 % O2 Device:SpO2: 97 % O2 Flow Rate: .O2 Flow Rate (L/min): 10 L/min  IO: Intake/output summary:   Intake/Output Summary (Last 24 hours) at 12/27/2019 1808 Last data filed at 12/27/2019 0449 Gross per 24 hour  Intake --  Output 1050 ml  Net -1050 ml    LBM: Last BM Date: 12/27/19 Baseline Weight: Weight:  61.7 kg Most recent weight: Weight: 61.6 kg     Palliative Assessment/Data:    Time Total: 55 minutes Greater than 50%  of this time was spent counseling and coordinating care related to the above assessment and plan.  Signed by: Micheline Rough, MD   Please contact Palliative Medicine Team phone at [phone] for questions and concerns.  For individual provider: See Shea Evans

## 2019-12-28 DIAGNOSIS — Z515 Encounter for palliative care: Secondary | ICD-10-CM

## 2019-12-28 DIAGNOSIS — Z7189 Other specified counseling: Secondary | ICD-10-CM

## 2019-12-28 LAB — COMPREHENSIVE METABOLIC PANEL
ALT: 8 U/L (ref 0–44)
AST: 11 U/L — ABNORMAL LOW (ref 15–41)
Albumin: 2.7 g/dL — ABNORMAL LOW (ref 3.5–5.0)
Alkaline Phosphatase: 71 U/L (ref 38–126)
Anion gap: 4 — ABNORMAL LOW (ref 5–15)
BUN: 28 mg/dL — ABNORMAL HIGH (ref 8–23)
CO2: 30 mmol/L (ref 22–32)
Calcium: 8.2 mg/dL — ABNORMAL LOW (ref 8.9–10.3)
Chloride: 99 mmol/L (ref 98–111)
Creatinine, Ser: 0.78 mg/dL (ref 0.61–1.24)
GFR calc Af Amer: >60 mL/min (ref >60)
GFR calc non Af Amer: >60 mL/min (ref >60)
Glucose, Bld: 170 mg/dL — ABNORMAL HIGH (ref 70–99)
Potassium: 4.6 mmol/L (ref 3.5–5.1)
Sodium: 133 mmol/L — ABNORMAL LOW (ref 135–145)
Total Bilirubin: 0.8 mg/dL (ref 0.3–1.2)
Total Protein: 5.5 g/dL — ABNORMAL LOW (ref 6.5–8.1)

## 2019-12-28 LAB — CBC
HCT: 25.5 % — ABNORMAL LOW (ref 39.0–52.0)
Hemoglobin: 7.4 g/dL — ABNORMAL LOW (ref 13.0–17.0)
MCH: 25.9 pg — ABNORMAL LOW (ref 26.0–34.0)
MCHC: 29 g/dL — ABNORMAL LOW (ref 30.0–36.0)
MCV: 89.2 fL (ref 80.0–100.0)
Platelets: 195 10*3/uL (ref 150–400)
RBC: 2.86 MIL/uL — ABNORMAL LOW (ref 4.22–5.81)
RDW: 22.3 % — ABNORMAL HIGH (ref 11.5–15.5)
WBC: 10.4 10*3/uL (ref 4.0–10.5)
nRBC: 0 % (ref 0.0–0.2)

## 2019-12-28 LAB — CBC WITH DIFFERENTIAL/PLATELET
Abs Immature Granulocytes: 0.08 10*3/uL — ABNORMAL HIGH (ref 0.00–0.07)
Basophils Absolute: 0.1 10*3/uL (ref 0.0–0.1)
Basophils Relative: 1 %
Eosinophils Absolute: 0.1 10*3/uL (ref 0.0–0.5)
Eosinophils Relative: 1 %
HCT: 24.3 % — ABNORMAL LOW (ref 39.0–52.0)
Hemoglobin: 7.2 g/dL — ABNORMAL LOW (ref 13.0–17.0)
Immature Granulocytes: 1 %
Lymphocytes Relative: 4 %
Lymphs Abs: 0.4 10*3/uL — ABNORMAL LOW (ref 0.7–4.0)
MCH: 26.2 pg (ref 26.0–34.0)
MCHC: 29.6 g/dL — ABNORMAL LOW (ref 30.0–36.0)
MCV: 88.4 fL (ref 80.0–100.0)
Monocytes Absolute: 1.2 10*3/uL — ABNORMAL HIGH (ref 0.1–1.0)
Monocytes Relative: 11 %
Neutro Abs: 8.6 10*3/uL — ABNORMAL HIGH (ref 1.7–7.7)
Neutrophils Relative %: 82 %
Platelets: 191 10*3/uL (ref 150–400)
RBC: 2.75 MIL/uL — ABNORMAL LOW (ref 4.22–5.81)
RDW: 22.4 % — ABNORMAL HIGH (ref 11.5–15.5)
WBC: 10.5 10*3/uL (ref 4.0–10.5)
nRBC: 0 % (ref 0.0–0.2)

## 2019-12-28 LAB — GLUCOSE, CAPILLARY
Glucose-Capillary: 163 mg/dL — ABNORMAL HIGH (ref 70–99)
Glucose-Capillary: 190 mg/dL — ABNORMAL HIGH (ref 70–99)
Glucose-Capillary: 216 mg/dL — ABNORMAL HIGH (ref 70–99)
Glucose-Capillary: 158 mg/dL — ABNORMAL HIGH (ref 70–99)

## 2019-12-28 LAB — HEMOGLOBIN AND HEMATOCRIT, BLOOD
HCT: 27.8 % — ABNORMAL LOW (ref 39.0–52.0)
Hemoglobin: 8.3 g/dL — ABNORMAL LOW (ref 13.0–17.0)

## 2019-12-28 LAB — PHOSPHORUS: Phosphorus: 3.9 mg/dL (ref 2.5–4.6)

## 2019-12-28 LAB — MAGNESIUM: Magnesium: 2.1 mg/dL (ref 1.7–2.4)

## 2019-12-28 MED ORDER — FUROSEMIDE 10 MG/ML IJ SOLN
20.0000 mg | Freq: Once | INTRAMUSCULAR | Status: AC
  Administered 2019-12-28: 20 mg via INTRAVENOUS
  Filled 2019-12-28: qty 2

## 2019-12-28 MED ORDER — SODIUM CHLORIDE 0.9% IV SOLUTION: Freq: Once | INTRAVENOUS | Status: DC

## 2019-12-28 NOTE — Progress Notes (Signed)
Added sterile water to Salter 02 system- uneventful. Removed NRB and left [place] 02 at 10 LPM at this time - Sp02 was 97%.

## 2019-12-28 NOTE — Progress Notes (Signed)
PROGRESS NOTE    [name]  BPZ:[id number] DOB: [birth year] DOA: 12/24/2019 PCP: [name], NP   Brief Narrative:  HPI per Dr. Chesley Mires on 12/24/19 74 yo male presented from rehab center with abdominal pain and maroon colored stool.  PCCM asked to admit to ICU.  He had CT abd/pelvis 12/06/19 that showed possible cecal mass.  History of present illness   Denies chest pain, dyspnea.  Has nausea but no vomiting.  Feels weak.  Never had this before.  He is DNR but agreeable to other medical therapies.  Past Medical History  Combined CHF with EF 25%, s/p AICD, Severe COPD/emphysema, chronic respiratory failure on 5 liters, Permanent A fib on ASA, ICH, mitral regurg s/p bioprosthetic MVR, NSCLC s/p wedge resection, DM type 2, HTN, HLD  **Interim History He had a CT angio of the abdomen pelvis which showed no evidence of any GI bleeding identified.  He continues to have some maroon stools.  He received 2 units of PRBCs.  After discussion with GI patient and the family did now want to pursue colonoscopy and favored conservative management only.  GI signed off the case.  His O2 saturations continued to drop so he had to be bumped up on his oxygen requirement and had to be placed on a NRB last night but now on 10 Liters.  Palliative care was consulted for further evaluation recommendations. He continues to have bloody bowel movements and hemoglobin started dropping so he is transfused another unit of PRBCs.  He appeared to be volume overload and respiratory status is worsening so he was given IV Lasix 20 mg.  Have consulted cardiology for further evaluation of suspected acute on chronic combined systolic and diastolic CHF.  Palliative care consulted for goals of care discussion after today's discussion Dr. Domingo Cocking recommends to continue current interventions and follow his clinical course from the 24 to 48 hours and have further discussion based on his progress.  Assessment & Plan:   Active  Problems:   Pressure ulcer   Gastrointestinal hemorrhage   Acute blood loss anemia   Protein-calorie malnutrition, severe  Lower GI bleeding with concern for cecal mass noted on CT abd/pelvis 12/06/19. -He has decided not to pursue endoscopic procedures, and would rather focus on conservative management -Continue  f/u CBC as he continues bloody bowel movement and he had to after his H&H was drawn this morning -F/u Hb and transfuse if < 7 or significant bleeding;' he is already transfused  2 units of PRBCs: Total of 3 total -Patient's hemoglobin/hematocrit dropped and he was transfused another unit of PRBCs; H&H after his blood transfusion now 8.3/27.8 and would like to keep a closer to 10 given his history of heart failure -He continues to have bloody bowel movements  -Advance diet as tolerated he is now on a regular diet -Have consulted palliative care for further goals of care discussion and will continue current course -Continue to Trend CBCs   Acute on chronic combined systolic and diastolic CHF with an EF of 25 to 30% status post AICD and pacemaker CAD Permanent A fib,  WHO group 2 and 3 pulmonary HTN HLD. -Continue digoxin 0.125 mg p.o. daily, atorvastatin 10 mg p.o. daily -hold outpt ASA,  -We will need to resume Lasix but his blood pressure is marginal in the lower side to give him IV 20 mg this a.m. and will give another 20 this evening -IV fluid has now been discontinued did receive some volume from his  blood transfusion -Cardiology has been consulted for further evaluation and management and they will see the patient tomorrow -I's and O's and daily weights; I's and O's are not accurate -Continue to monitor on telemetry in the stepdown unit for now -No longer on anticoagulation due to a past ICH -Repeat chest x-ray in a.m.  Severe COPD/emphysema with at least 5 L of nasal cannula Acute on chronic hypoxic respiratory failure, worsening History of non-small cell lung cancer  status post VATS -C/w scheduled duoneb and with as needed albuterol nebs every 4 hours as needed wheezing - goal SpO2 88 to 95% -SpO2: 100 % O2 Flow Rate (L/min): 12 L/min -His O2 saturations worsened and he has been saturating in the low 80s so we will need to increase his oxygen requirement and monitor carefully - hold outpt breztri -Have consulted palliative care for further goals of care discussion and his respiratory status is likely worsening in the setting of above and likely from heart failure -Chest x-ray yesterday showed mild areas of atelectasis and/or infiltrate seen within the bilateral midlung field and right lung base as well as a small right pleural effusion however we will repeat the chest x-ray in a.m. -We will have cardiology assist with diuresis given that his blood pressure is on the lower side.  In the interim be given IV Lasix 20 mg  DM type II poorly controlled with hyperglycemia. - SSI - hold metformin -CBGs have been ranging from 190-260  Hx of Depression. - resume Citalopram  BPH, urine retention. -C/w bethanechol, tamsuolsin  Deconditioning. - PT/OT further evaluate and treat and recommending SNF again   History of alcoholic cirrhosis -Has not been drinking for over a decade -Continue monitor LFTs and bilirubin and they are normal  Goals of care. - DNR; Does not want further work up for his GIB - Will consult Palliative Care for further evaluation and further GOC Discussion we will continue current course and have consulted cardiology for further assistance given his worsening respiratory status and what appears to be a volume overload on his x-ray  Hyponatremia -Mild at 133 again today -Continue to Monitor and Trend   Stage 1 Coccyx Ulcer, pOA -Continue Wound Care  DVT prophylaxis: SCDs given his GI bleeding Code Status: DO NOT RESUSCITATE  Family Communication: No family present at bedside Disposition Plan: Pending further clinical  stabilization and improvement of his hypoxia and bleeding.  We have consulted palliative care for goals of care discussion and will need to ensure stability prior to D/C  Status is: Inpatient  Remains inpatient appropriate because:Unsafe d/c plan, IV treatments appropriate due to intensity of illness or inability to take PO and Inpatient level of care appropriate due to severity of illness   Dispo: The patient is from: Home              Anticipated d/c is to: TBD              Anticipated d/c date is: 2 days              Patient currently is not medically stable to d/c.  Consultants:   Gastroenterology  PCCM Transfer  Palliative Care   Cardiology   Procedures: None   Antimicrobials:  Anti-infectives (From admission, onward)   None     Subjective: Seen and evaluated at bedside and stated that he is feeling short of breath today and he is on a nonrebreather and 15 L.  Eventually he is weaned back to 70  L.  No chest pain.  Continues have some lower extremity swelling.  Also continues to have bloody bowel movements.  Awaiting to discuss with palliative.  No other concerns or complaints at this time.  Objective: Vitals:   12/28/19 1413 12/28/19 1500 12/28/19 1510 12/28/19 1653  BP:   (!) 119/49   Pulse:  76 74   Resp:  (!) 30 (!) 23   Temp:   98.2 F (36.8 C) 98.3 F (36.8 C)  TempSrc:   Oral Oral  SpO2: 91%  100%   Weight:      Height:        Intake/Output Summary (Last 24 hours) at 12/28/2019 1756 Last data filed at 12/28/2019 1510 Gross per 24 hour  Intake 552.33 ml  Output 1175 ml  Net -622.67 ml   Filed Weights   12/26/19 0500 12/27/19 0500 12/28/19 0437  Weight: 61.7 kg 61.6 kg 72.1 kg   Examination: Physical Exam:  Constitutional: The patient is a thin elderly Caucasian male currently in some respiratory distress and he appears to be a little short of breath and complaining of shortness of breath. Continues to desaturate and is now on 15 L percent  nonrebreather. Eyes: Lids and conjunctivae normal, sclerae anicteric  ENMT: External Ears, Nose appear normal. Grossly normal hearing.  Neck: Appears normal, supple, no cervical masses, normal ROM, no appreciable thyromegaly Respiratory: Diminished to auscultation bilaterally with coarse breath sounds and some crackles. No appreciable wheezing, rales. Has slightly labored breathing and he is now on 15 L nonrebreather this morning. Cardiovascular: Mildly tachycardic and regular rhythm, no murmurs / rubs / gallops. S1 and S2 auscultated. 1+ edema Abdomen: Soft, non-tender, non-distended.  Bowel sounds positive.  GU: Deferred. Musculoskeletal: No clubbing / cyanosis of digits/nails. No joint deformity upper and lower extremities.  Skin: No rashes, lesions, ulcers on limited skin evaluation. No induration; Warm and dry.  Neurologic: CN 2-12 grossly intact with no focal deficits. Romberg sign cerebellar reflexes not assessed.  Psychiatric: Normal judgment and insight. Alert and oriented x 3. Slightly anxious mood and appropriate affect.   Data Reviewed: I have personally reviewed following labs and imaging studies  CBC: Recent Labs  Lab 12/24/19 0525 12/24/19 1705 12/25/19 0500 12/25/19 0500 12/26/19 0521 12/27/19 0203 12/28/19 0135 12/28/19 1035 12/28/19 1634  WBC 7.9  --  7.6  --  7.6 11.4* 10.5 10.4  --   NEUTROABS 6.7  --   --   --   --  9.8* 8.6*  --   --   HGB 6.5*   < > 7.7*   < > 7.5* 7.8* 7.2* 7.4* 8.3*  HCT 22.5*   < > 25.3*   < > 24.7* 26.0* 24.3* 25.5* 27.8*  MCV 83.6  --  85.8  --  87.3 87.8 88.4 89.2  --   PLT 177  --  181  --  186 185 191 195  --    < > = values in this interval not displayed.   Basic Metabolic Panel: Recent Labs  Lab 12/24/19 0525 12/25/19 0500 12/26/19 0756 12/27/19 0203 12/28/19 0135  NA 137 134* 134* 133* 133*  K 3.6 4.9 4.3 4.5 4.6  CL 104 97* 99 98 99  CO2 26 28 29 28 30   GLUCOSE 186* 151* 166* 191* 170*  BUN 33* 20 20 24* 28*    CREATININE 0.65 0.50* 0.64 0.88 0.78  CALCIUM 6.9* 8.2* 8.4* 8.5* 8.2*  MG  --   --  2.1 2.2  2.1  PHOS  --   --  3.4 4.4 3.9   GFR: Estimated Creatinine Clearance: 85.1 mL/min (by C-G formula based on SCr of 0.78 mg/dL). Liver Function Tests: Recent Labs  Lab 12/24/19 0525 12/26/19 0756 12/27/19 0203 12/28/19 0135  AST 11* 10* 9* 11*  ALT 8 9 7 8   ALKPHOS 53 66 71 71  BILITOT 0.5 1.1 0.9 0.8  PROT 4.7* 5.4* 5.7* 5.5*  ALBUMIN 2.5* 2.9* 2.9* 2.7*   Recent Labs  Lab 12/24/19 0525  LIPASE 25   No results for input(s): AMMONIA in the last 168 hours. Coagulation Profile: Recent Labs  Lab 12/24/19 0525  INR 1.2   Cardiac Enzymes: No results for input(s): CKTOTAL, CKMB, CKMBINDEX, TROPONINI in the last 168 hours. BNP (last 3 results) No results for input(s): PROBNP in the last 8760 hours. HbA1C: No results for input(s): HGBA1C in the last 72 hours. CBG: Recent Labs  Lab 12/27/19 1639 12/27/19 2111 12/28/19 0812 12/28/19 1225 12/28/19 1618  GLUCAP 216* 171* 163* 190* 216*   Lipid Profile: No results for input(s): CHOL, HDL, LDLCALC, TRIG, CHOLHDL, LDLDIRECT in the last 72 hours. Thyroid Function Tests: No results for input(s): TSH, T4TOTAL, FREET4, T3FREE, THYROIDAB in the last 72 hours. Anemia Panel: No results for input(s): VITAMINB12, FOLATE, FERRITIN, TIBC, IRON, RETICCTPCT in the last 72 hours. Sepsis Labs: Recent Labs  Lab 12/24/19 0547  LATICACIDVEN 2.6*    Recent Results (from the past 240 hour(s))  SARS Coronavirus 2 by RT PCR (hospital order, performed in [facility] hospital lab) Nasopharyngeal Nasopharyngeal Swab     Status: None   Collection Time: 12/24/19  7:29 AM   Specimen: Nasopharyngeal Swab  Result Value Ref Range Status   SARS Coronavirus 2 NEGATIVE NEGATIVE Final    Comment: (NOTE) SARS-CoV-2 target nucleic acids are NOT DETECTED.  The SARS-CoV-2 RNA is generally detectable in upper and lower respiratory specimens during the acute  phase of infection. The lowest concentration of SARS-CoV-2 viral copies this assay can detect is 250 copies / mL. A negative result does not preclude SARS-CoV-2 infection and should not be used as the sole basis for treatment or other patient management decisions.  A negative result may occur with improper specimen collection / handling, submission of specimen other than nasopharyngeal swab, presence of viral mutation(s) within the areas targeted by this assay, and inadequate number of viral copies (<250 copies / mL). A negative result must be combined with clinical observations, patient history, and epidemiological information.  Fact Sheet for Patients:   StrictlyIdeas.no  Fact Sheet for Healthcare Providers: BankingDealers.co.za  This test is not yet approved or  cleared by the [place] FDA and has been authorized for detection and/or diagnosis of SARS-CoV-2 by FDA under an Emergency Use Authorization (EUA).  This EUA will remain in effect (meaning this test can be used) for the duration of the COVID-19 declaration under Section 564(b)(1) of the Act, 21 U.S.C. section 360bbb-3(b)(1), unless the authorization is terminated or revoked sooner.  Performed at [facility], [place] [address], [place], [place] [address]   MRSA PCR Screening     Status: None   Collection Time: 12/24/19 11:05 AM   Specimen: Nasal Mucosa; Nasopharyngeal  Result Value Ref Range Status   MRSA by PCR NEGATIVE NEGATIVE Final    Comment:        The GeneXpert MRSA Assay (FDA approved for NASAL specimens only), is one component of a comprehensive MRSA colonization surveillance program. It is  not intended to diagnose MRSA infection nor to guide or monitor treatment for MRSA infections. Performed at [facility], [place] [address] [place],  [address]      RN Pressure Injury Documentation: Pressure Injury 12/08/19  Coccyx Mid;Right;Left Stage 1 -  Intact skin with non-blanchable redness of a localized area usually over a bony prominence. redness or discoloration on coccyx (Active)  12/08/19 0700  Location: Coccyx  Location Orientation: Mid;Right;Left  Staging: Stage 1 -  Intact skin with non-blanchable redness of a localized area usually over a bony prominence.  Wound Description (Comments): redness or discoloration on coccyx  Present on Admission: Yes     Pressure Injury 12/24/19 Coccyx Left;Right Stage 1 -  Intact skin with non-blanchable redness of a localized area usually over a bony prominence. (Active)  12/24/19   Location: Coccyx  Location Orientation: Left;Right  Staging: Stage 1 -  Intact skin with non-blanchable redness of a localized area usually over a bony prominence.  Wound Description (Comments):   Present on Admission: Yes     Pressure Injury 12/28/19 Ear Left Stage 2 -  Partial thickness loss of dermis presenting as a shallow open injury with a red, pink wound bed without slough. back of L ear lobe, redness with broken skin (Active)  12/28/19 0000  Location: Ear  Location Orientation: Left  Staging: Stage 2 -  Partial thickness loss of dermis presenting as a shallow open injury with a red, pink wound bed without slough.  Wound Description (Comments): back of L ear lobe, redness with broken skin  Present on Admission: No     Estimated body mass index is 20.97 kg/m as calculated from the following:   Height as of this encounter: 6\' 1"  (1.854 m).   Weight as of this encounter: 72.1 kg.  Malnutrition Type:  Nutrition Problem: Severe Malnutrition Etiology: chronic illness (COPD and lung cancer)   Malnutrition Characteristics:  Signs/Symptoms: moderate fat depletion, moderate muscle depletion, severe fat depletion, severe muscle depletion   Nutrition Interventions:  Interventions: Boost Breeze, Magic cup, MVI, Other (Comment) Dillard [place])   Radiology Studies: DG CHEST  PORT 1 VIEW  Result Date: 12/27/2019 CLINICAL DATA:  Shortness of breath. EXAM: PORTABLE CHEST 1 VIEW COMPARISON:  December 24, 2019 FINDINGS: There is a dual lead AICD. Mild diffusely increased interstitial lung markings are seen with mild areas of atelectasis and/or infiltrate seen within the bilateral mid lung fields and right lung base. There is a small right pleural effusion. No pneumothorax is identified. The cardiac silhouette is moderately enlarged. The visualized skeletal structures are unremarkable. IMPRESSION: 1. Mild areas of atelectasis and/or infiltrate seen within the bilateral mid lung fields and right lung base. 2. Small right pleural effusion. Electronically Signed   By: Virgina Norfolk M.D.   On: 12/27/2019 20:27    Scheduled Meds: . sodium chloride   Intravenous Once  . atorvastatin  10 mg Oral Daily  . bethanechol  10 mg Oral TID  . Chlorhexidine Gluconate Cloth  6 each Topical Daily  . citalopram  10 mg Oral Daily  . digoxin  0.125 mg Oral Daily  . feeding supplement  1 Container Oral Q24H  . feeding supplement ([employer] STANDARD 1.4)  325 mL Oral Q24H  . insulin aspart  0-5 Units Subcutaneous QHS  . insulin aspart  0-9 Units Subcutaneous TID WC  . ipratropium-albuterol  3 mL Nebulization TID  . mouth rinse  15 mL Mouth Rinse BID  . pantoprazole  40  mg Oral Daily  . tamsulosin  0.4 mg Oral QPC supper  . cyanocobalamin  1,000 mcg Oral Daily   Continuous Infusions:   LOS: 4 days   Kerney Elbe, DO [facility] PAGER is on AMION  If 7PM-7AM, please contact night-coverage [email or web address]

## 2019-12-28 NOTE — Progress Notes (Signed)
Once NRB removed - gave ned tx at 10 LPM- Sp02 dropped to high 70's- placed PT back on NRB at 15 LPM and [place] to 12 LPM- current Sp02 92- RN aware.

## 2019-12-28 NOTE — Progress Notes (Signed)
Daily Progress Note   Patient Name: [name]       Date: 12/28/2019 DOB: [birth year]  Age: 74 y.o. MRN#: [id number] Attending Physician: Kerney Elbe, DO Primary Care Physician: [name], NP Admit Date: 12/24/2019  Reason for Consultation/Follow-up: Establishing goals of care  Subjective: I met today with [name] and his wife, [name].  We discussed his clinical course as well as continued concern regarding his multiple comorbid conditions that are working against each other.  He has rectal bleed and some downtrending in hemoglobin so plan for another unit of blood today.  We discussed how this would further impact his congestive heart failure with increase in volume, but he is also been having some hypotension which has been limiting ability to use diuretics.  Discussed that he continues to have increase in oxygen requirements which may be multifactorial including symptomatic anemia, fluid overload, and worsening of his underlying COPD.  I expressed to Mr. Vest and his wife my concern that he may not improve from his current situation and we talked about plan to continue to see how he does over the next day or 2 but if it appears that he continues to decline, we will need to determine what is the best way to serve him moving forward in light of what is likely irreversible decline.  We talked that in the best case scenario, he and his wife would like to see him transition back to skilled facility to complete course of rehab.  He has been there for 13 or 14 days and his wife says that they told her that he can be there for total 21 days prior to them needing to start paying for the services.  She states they do not have resources to pay for co-pay at skilled facility.  She is concerned  about caring for him at home whenever he returns following rehab.  We discussed that when the time comes that he does return home, he may benefit from considering election of his hospice benefits if his goal is to be at home with a goal of focusing on his comfort and avoiding further hospitalizations.  Discussed that at this point, I think we need to see how he does over the next day or 2 to help determine what our next step should be as  there is real possibility that he will continue to decline and we may be looking at a very short period of time.  They expressed understanding and were open to follow-up again tomorrow to continue conversation based upon how his clinical course progresses overnight.  I called and spoke with Dr. Alfredia Ferguson.  I reviewed my conversation with Mr. Bracken and his wife and we discussed difficulty in managing multiple comorbidities and concern about balancing his anemia with his congestive heart failure as he receives another unit of blood.  Length of Stay: 4  Current Medications: Scheduled Meds:  . sodium chloride   Intravenous Once  . atorvastatin  10 mg Oral Daily  . bethanechol  10 mg Oral TID  . Chlorhexidine Gluconate Cloth  6 each Topical Daily  . citalopram  10 mg Oral Daily  . digoxin  0.125 mg Oral Daily  . feeding supplement  1 Container Oral Q24H  . feeding supplement (K[employer]MS STANDARD 1.4)  325 mL Oral Q24H  . insulin aspart  0-5 Units Subcutaneous QHS  . insulin aspart  0-9 Units Subcutaneous TID WC  . ipratropium-albuterol  3 mL Nebulization TID  . mouth rinse  15 mL Mouth Rinse BID  . pantoprazole  40 mg Oral Daily  . tamsulosin  0.4 mg Oral QPC supper  . cyanocobalamin  1,000 mcg Oral Daily    Continuous Infusions:   PRN Meds: albuterol, ondansetron (ZOFRAN) IV, oxyCODONE-acetaminophen  Physical Exam        General: Alert, awake, in no acute distress. Wearing nasal cannula with nonrebreather on top of it.  Nonrebreather removed for period of  time during my encounter, however, he desaturated to 80% after around 10 minutes and I replaced it. HEENT: No bruits, no goiter, no JVD Heart: Regular rate and rhythm. No murmur appreciated. Lungs: Very poor air movement, difficult to auscultate due to poor air movement, however, appreciate some faint crackles Abdomen: Soft, nontender, nondistended, positive bowel sounds.  Ext: No significant edema Skin: Warm and dry Neuro: Grossly intact, nonfocal.    Vital Signs: BP (!) 120/52   Pulse 74   Temp 98.2 F (36.8 C) (Oral)   Resp 20   Ht 6' 1"  (1.854 m)   Wt 72.1 kg   SpO2 91%   BMI 20.97 kg/m  SpO2: SpO2: 91 % O2 Device: O2 Device: High Flow Nasal Cannula, NRB O2 Flow Rate: O2 Flow Rate (L/min):  ([place] 12 NRB 15)  Intake/output summary:   Intake/Output Summary (Last 24 hours) at 12/28/2019 1326 Last data filed at 12/28/2019 0309 Gross per 24 hour  Intake 100 ml  Output 1175 ml  Net -1075 ml   LBM: Last BM Date: 12/28/19 Baseline Weight: Weight: 61.7 kg Most recent weight: Weight: 72.1 kg       Palliative Assessment/Data:      Patient Active Problem List   Diagnosis Date Noted  . Protein-calorie malnutrition, severe 12/25/2019  . Gastrointestinal hemorrhage 12/24/2019  . Acute blood loss anemia   . Pressure ulcer 12/08/2019  . Depression, recurrent ([place])   . Acute respiratory failure with hypoxia ([place])   . Goals of care, counseling/discussion   . Palliative care by specialist   . Acute exacerbation of CHF (congestive heart failure) ([place]) 11/13/2019  . Elevated troponin   . Chronic atrial fibrillation ([place])   . Weakness generalized   . DNR (do not resuscitate)   . Acute on chronic combined systolic (congestive) and diastolic (congestive) heart failure ([place]) 04/27/2019  .  COPD with acute exacerbation ([place]) 08/01/2018  . Pulmonary edema 07/30/2018  . Health care maintenance 05/09/2018  . Gastritis and gastroduodenitis   . Diverticulum of duodenum   . Aortic  root dilatation ([place])   . 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 Atherosclerosis of aorta ([place]) 08/11/2016  . Orthostatic hypotension 09/26/2015  . Near syncope 09/24/2015  . Syncope 09/24/2015  . Hepatic cirrhosis ([place]) 09/25/2014  . History of colonic polyps 09/25/2014  . Serum total bilirubin elevated 06/30/2014  . Follow-up examination, following unspecified surgery 01/27/2014  . Lung cancer ([place]) 11/29/2013  . Prosthetic valve dysfunction   . Chronic combined systolic and diastolic CHF (congestive heart failure) ([place])   . Type 2 diabetes mellitus ([place]) 11/04/2013  . Myocardial infarction ([place]) 11/01/2013  . Cardiomyopathy, nonischemic ([place]) 11/01/2013  . Acute on chronic respiratory failure with hypoxia ([place]) 08/15/2013  . H/O intracranial hemorrhage 08/13/2013  . H/O endocarditis 08/13/2013  . H/O: CVA (cerebrovascular accident) 08/13/2013  . Community acquired pneumonia 08/13/2013  . Chronic respiratory failure assoc with chf/ PAH 08/12/2013  . H/O atrioventricular nodal ablation   . S/P MVR (mitral valve replacement)   . Hypertension associated with diabetes ([place])   . Hyperlipidemia associated with type 2 diabetes mellitus ([place])   . Permanent atrial fibrillation   . Ejection fraction < 50%   . ICD (implantable cardioverter-defibrillator), biventricular, in situ   . Renal artery stenosis ([place])   . Pulmonary hypertension ([place])   . Primary cancer of left upper lobe of lung ([place]) 04/20/2009  . COLONIC POLYPS, ADENOMATOUS 03/22/2007  . COPD GOLD II 01/11/2007  . GERD 01/11/2007    Palliative Care Assessment & Plan   Patient Profile: 74 y.o. male  with past medical history of combined CHF with EF 25%, status post AICD, severe COPD/emphysema, chronic respiratory failure on 5 L at skilled facility, permanent A. fib on ASA, intercranial hemorrhage, mitral regurg status post bioprosthetic valve replacement, non-small cell lung cancer status post wedge resection, DM type II,  hypertension, hyperlipidemia admitted on 12/24/2019 with abdominal pain and maroon-colored stool.  Of note he recently had CT of abdomen pelvis that showed possible cecal mass.  He has declined consideration for colonoscopy as he been told in the past he would be high risk for any sort of sedating procedure.  Also, he has had increasing oxygen needs and is wearing 12 L nasal cannula as well as intermittent nonrebreather which is up from his baseline of 5 L nasal cannula outside of the hospital. Palliative consulted for goals of care.  Assessment: Patient Active Problem List   Diagnosis Date Noted  . Protein-calorie malnutrition, severe 12/25/2019  . Gastrointestinal hemorrhage 12/24/2019  . Acute blood loss anemia   . Pressure ulcer 12/08/2019  . Depression, recurrent ([place])   . Acute respiratory failure with hypoxia ([place])   . Goals of care, counseling/discussion   . Palliative care by specialist   . Acute exacerbation of CHF (congestive heart failure) ([place]) 11/13/2019  . Elevated troponin   . Chronic atrial fibrillation ([place])   . Weakness generalized   . DNR (do not resuscitate)   . Acute on chronic combined systolic (congestive) and diastolic (congestive) heart failure ([place]) 04/27/2019  . COPD with acute exacerbation ([place]) 08/01/2018  . Pulmonary edema 07/30/2018  . Health care maintenance 05/09/2018  . Gastritis and gastroduodenitis   . Diverticulum of duodenum   . Aortic root dilatation ([place])   . 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 Atherosclerosis of  aorta ([place]) 08/11/2016  . Orthostatic hypotension 09/26/2015  . Near syncope 09/24/2015  . Syncope 09/24/2015  . Hepatic cirrhosis ([place]) 09/25/2014  . History of colonic polyps 09/25/2014  . Serum total bilirubin elevated 06/30/2014  . Follow-up examination, following unspecified surgery 01/27/2014  . Lung cancer ([place]) 11/29/2013  . Prosthetic valve dysfunction   . Chronic combined systolic and diastolic CHF (congestive heart  failure) ([place])   . Type 2 diabetes mellitus ([place]) 11/04/2013  . Myocardial infarction ([place]) 11/01/2013  . Cardiomyopathy, nonischemic ([place]) 11/01/2013  . Acute on chronic respiratory failure with hypoxia ([place]) 08/15/2013  . H/O intracranial hemorrhage 08/13/2013  . H/O endocarditis 08/13/2013  . H/O: CVA (cerebrovascular accident) 08/13/2013  . Community acquired pneumonia 08/13/2013  . Chronic respiratory failure assoc with chf/ PAH 08/12/2013  . H/O atrioventricular nodal ablation   . S/P MVR (mitral valve replacement)   . Hypertension associated with diabetes ([place])   . Hyperlipidemia associated with type 2 diabetes mellitus ([place])   . Permanent atrial fibrillation   . Ejection fraction < 50%   . ICD (implantable cardioverter-defibrillator), biventricular, in situ   . Renal artery stenosis ([place])   . Pulmonary hypertension ([place])   . Primary cancer of left upper lobe of lung ([place]) 04/20/2009  . COLONIC POLYPS, ADENOMATOUS 03/22/2007  . COPD GOLD II 01/11/2007  . GERD 01/11/2007   Recommendations/Plan: -DNR/DNI -Continue current interventions.  Discussed concerned that he has multiple comorbidities working against each other including rectal bleed, CHF, emphysema, and worsening hypoxia.  Plan to continue to follow his clinical course for another 24 to 48 hours, but I did express concern today that he may continue to decline regardless of interventions moving forward.  We did have preliminary discussion about how hospice may be his best option moving forward depending upon his clinical course over the weekend.   Code Status:    Code Status Orders  (From admission, onward)         Start     Ordered   12/24/19 0843  Do not attempt resuscitation (DNR)  Continuous       Question Answer Comment  In the event of cardiac or respiratory ARREST Do not call a "code blue"   In the event of cardiac or respiratory ARREST Do not perform Intubation, CPR, defibrillation or ACLS   In the event  of cardiac or respiratory ARREST Use medication by any route, position, wound care, and other measures to relive pain and suffering. May use oxygen, suction and manual treatment of airway obstruction as needed for comfort.      12/24/19 0842        Code Status History    Date Active Date Inactive Code Status Order ID Comments User Context   12/17/2019 1137 12/24/2019 0515 DNR [id number]  Gayland Curry, DO Outpatient   12/06/2019 2335 12/13/2019 0055 DNR [id number]  Lenore Cordia, MD ED   11/13/2019 2118 11/25/2019 1857 DNR [id number]  Truddie Hidden, MD ED   11/13/2019 2114 11/13/2019 2118 Full Code [id number]  Truddie Hidden, MD ED   06/07/2019 1321 06/10/2019 2108 DNR [id number]  Karmen Bongo, MD ED   04/27/2019 1937 05/02/2019 1915 DNR [id number]  Lenore Cordia, MD ED   07/30/2018 1722 08/02/2018 1814 DNR [id number]  Cristal Deer, MD Inpatient   09/24/2015 1730 09/27/2015 1751 Full Code [id number]  Kelvin Cellar, MD Inpatient   11/29/2013 1523 12/03/2013 1401 Full Code [id number]  [name], MD Inpatient   08/12/2013 1804 08/18/2013 1820  Full Code [id number]  Barrett, Evelene Croon, PA-C Inpatient   Advance Care Planning Activity    Advance Directive Documentation     Most Recent Value  Type of Advance Directive Out of facility DNR (pink MOST or yellow form)  Pre-existing out of facility DNR order (yellow form or pink MOST form) Pink MOST form placed in chart (order not valid for inpatient use)  "MOST" Form in Place? --       Prognosis:  Guarded  Discharge Planning:  To Be Determined based on clinical course.  His goal is to transition back to skilled nursing facility with palliative care follow-up.  Care plan was discussed with patient, wife, Dr. Theone Murdoch, and bedside RN  Thank you for allowing the Palliative Medicine Team to assist in the care of this patient.   Time In: 1050 Time Out: 1130 Total Time 40 Prolonged Time Billed No      Greater than 50%  of this time was spent  counseling and coordinating care related to the above assessment and plan.  Micheline Rough, MD  Please contact Palliative Medicine Team phone at [phone] for questions and concerns.

## 2019-12-29 ENCOUNTER — Inpatient Hospital Stay (HOSPITAL_COMMUNITY): Payer: Medicare Other

## 2019-12-29 DIAGNOSIS — I4821 Permanent atrial fibrillation: Secondary | ICD-10-CM

## 2019-12-29 DIAGNOSIS — I5043 Acute on chronic combined systolic (congestive) and diastolic (congestive) heart failure: Secondary | ICD-10-CM

## 2019-12-29 DIAGNOSIS — J9621 Acute and chronic respiratory failure with hypoxia: Secondary | ICD-10-CM

## 2019-12-29 LAB — COMPREHENSIVE METABOLIC PANEL
ALT: 8 U/L (ref 0–44)
AST: 8 U/L — ABNORMAL LOW (ref 15–41)
Albumin: 2.6 g/dL — ABNORMAL LOW (ref 3.5–5.0)
Alkaline Phosphatase: 70 U/L (ref 38–126)
Anion gap: 9 (ref 5–15)
BUN: 27 mg/dL — ABNORMAL HIGH (ref 8–23)
CO2: 30 mmol/L (ref 22–32)
Calcium: 8.1 mg/dL — ABNORMAL LOW (ref 8.9–10.3)
Chloride: 94 mmol/L — ABNORMAL LOW (ref 98–111)
Creatinine, Ser: 0.66 mg/dL (ref 0.61–1.24)
GFR calc Af Amer: >60 mL/min (ref >60)
GFR calc non Af Amer: >60 mL/min (ref >60)
Glucose, Bld: 210 mg/dL — ABNORMAL HIGH (ref 70–99)
Potassium: 4.9 mmol/L (ref 3.5–5.1)
Sodium: 133 mmol/L — ABNORMAL LOW (ref 135–145)
Total Bilirubin: 0.8 mg/dL (ref 0.3–1.2)
Total Protein: 5.5 g/dL — ABNORMAL LOW (ref 6.5–8.1)

## 2019-12-29 LAB — CBC WITH DIFFERENTIAL/PLATELET
Abs Immature Granulocytes: 0.07 10*3/uL (ref 0.00–0.07)
Basophils Absolute: 0 10*3/uL (ref 0.0–0.1)
Basophils Relative: 0 %
Eosinophils Absolute: 0.2 10*3/uL (ref 0.0–0.5)
Eosinophils Relative: 2 %
HCT: 26.1 % — ABNORMAL LOW (ref 39.0–52.0)
Hemoglobin: 7.7 g/dL — ABNORMAL LOW (ref 13.0–17.0)
Immature Granulocytes: 1 %
Lymphocytes Relative: 3 %
Lymphs Abs: 0.3 10*3/uL — ABNORMAL LOW (ref 0.7–4.0)
MCH: 26.4 pg (ref 26.0–34.0)
MCHC: 29.5 g/dL — ABNORMAL LOW (ref 30.0–36.0)
MCV: 89.4 fL (ref 80.0–100.0)
Monocytes Absolute: 1.4 10*3/uL — ABNORMAL HIGH (ref 0.1–1.0)
Monocytes Relative: 13 %
Neutro Abs: 8.6 10*3/uL — ABNORMAL HIGH (ref 1.7–7.7)
Neutrophils Relative %: 81 %
Platelets: 207 10*3/uL (ref 150–400)
RBC: 2.92 MIL/uL — ABNORMAL LOW (ref 4.22–5.81)
RDW: 21.4 % — ABNORMAL HIGH (ref 11.5–15.5)
WBC: 10.6 10*3/uL — ABNORMAL HIGH (ref 4.0–10.5)
nRBC: 0.2 % (ref 0.0–0.2)

## 2019-12-29 LAB — BPAM RBC
Blood Product Expiration Date: 202107202359
ISSUE DATE / TIME: 202106191216
Unit Type and Rh: 5100

## 2019-12-29 LAB — TYPE AND SCREEN
ABO/RH(D): O POS
Antibody Screen: NEGATIVE
Unit division: 0

## 2019-12-29 LAB — GLUCOSE, CAPILLARY
Glucose-Capillary: 173 mg/dL — ABNORMAL HIGH (ref 70–99)
Glucose-Capillary: 144 mg/dL — ABNORMAL HIGH (ref 70–99)
Glucose-Capillary: 165 mg/dL — ABNORMAL HIGH (ref 70–99)
Glucose-Capillary: 216 mg/dL — ABNORMAL HIGH (ref 70–99)

## 2019-12-29 LAB — PHOSPHORUS: Phosphorus: 3 mg/dL (ref 2.5–4.6)

## 2019-12-29 LAB — BRAIN NATRIURETIC PEPTIDE: B Natriuretic Peptide: 1473.9 pg/mL — ABNORMAL HIGH (ref 0.0–100.0)

## 2019-12-29 LAB — MAGNESIUM: Magnesium: 2.1 mg/dL (ref 1.7–2.4)

## 2019-12-29 MED ORDER — FUROSEMIDE 10 MG/ML IJ SOLN
80.0000 mg | Freq: Two times a day (BID) | INTRAMUSCULAR | Status: DC
  Administered 2019-12-29 – 2020-01-01 (×7): 80 mg via INTRAVENOUS
  Filled 2019-12-29 (×7): qty 8

## 2019-12-29 NOTE — Consult Note (Signed)
Cardiology Consultation:   Patient ID: ELLIE SPICKLER MRN: [id number]; DOB: January 22, 1946  Admit date: 12/24/2019 Date of Consult: 12/29/2019  Primary Care Provider: [name], NP [facility] Cardiologist: Candee Furbish, MD  [facility] Electrophysiologist:  Cristopher Peru, MD    Patient Profile:   EZECHIEL STOOKSBURY is a 74 y.o. 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  History of Present Illness:   Mr. Debow presented on 12/24/2019 with abdominal pain and maroon-colored stool.  He had a CT abdomen pelvis on 12/06/2019 that showed possible cecal mass.  CTA abdomen pelvis on 12/24/2019 showed no source of GI bleeding.  Patient declined any endoscopic procedures, elected to pursue conservative management.  He received 2 units PRBCs.  O2 sats worsened, had to be placed on NRB.  He was receiving IV fluids, was discontinued yesterday.  Palliative care was consulted for goals of care discussion.  He currently reports feeling short of breath.  Denies any chest pain.  Diuresed with IV lasix 20 mg BID yesterday, was net negative 850cc.  On 15L [place] this morning.  Continues to have bleeding.   Past Medical History:  Diagnosis Date  . Atrial septal defect    Closed with surgery January, 2010  . Automatic implantable cardioverter-defibrillator in situ    LV dysfunction and pacer needed for AV node lesion  . Chronic combined systolic and diastolic CHF (congestive heart failure) ([place])   . Colon polyps   . COPD (chronic obstructive pulmonary disease) (HCC)    O2- 2 liters, nasal cannula, q night   .  CVA (cerebral vascular accident) ([place]) 2009   denies residual on 08/14/2013  . Diabetes mellitus without complication ([place])    type 2  . Dyslipidemia   . Endocarditis    Bacterial, 2009  . Hypertension   . Intracranial hemorrhage (HCC)    Coumadin cannot be used because of the history of his bleed  . Lung cancer ([place]) 11/29/2013   T1N0 Stage Ia non-small cell carcinoma left lung treated with wedge resection  . Myocardial infarction ([place]) 2010  . Pacemaker    combo pacer and icd  . Permanent atrial fibrillation    Originally Coumadin use for atrial fibrillation  //   he had intracerebral hemorrhage with an INR of 2.3 June, 2009. Anticoagulation could no longer be used.  //  Rapid atrial fibrillation after inferior MI October, 2010..........Marland Kitchen AV node ablation done at that time with ICD pacemaker placed (EF 35%).   //   Left atrial appendage tied off at the time of mitral valve surgery January, 2010 (maze pro  . Pneumonia 07/2018  . Pulmonary hypertension (HCC)    Moderate  . Renal artery stenosis (HCC)    Mild by history  . 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 Spontaneous pneumothorax    right thoracotomy - distant past  . Status post minimally invasive mitral valve replacement with bioprosthetic valve    33 mm Medtronic Mosaic porcine bioprosthesis placed via right mini thoracotomy for bacterial endocarditis complicated by severe MR and CHF   . Thoracic aortic aneurysm ([place]) 08/11/2016   a - Chest  CTA 1/18:  Aneurysmal dilatation of aortic root is noted at 5.1 cm.     Past Surgical History:  Procedure Laterality Date  . APPENDECTOMY    . ASD REPAIR, SECUNDUM  07/17/2008   pericardial patch closure of ASD  . AV NODE ABLATION  07/2008   for rapid atrial fib  . CARDIAC CATHETERIZATION    . CARDIAC DEFIBRILLATOR PLACEMENT  ~ [address]  . CARDIAC VALVE REPLACEMENT    . CATARACT EXTRACTION W/ INTRAOCULAR LENS  IMPLANT, BILATERAL Bilateral   . ESOPHAGOGASTRODUODENOSCOPY (EGD) WITH  PROPOFOL N/A 09/29/2016   Procedure: ESOPHAGOGASTRODUODENOSCOPY (EGD) WITH PROPOFOL;  Surgeon: [name], MD;  Location: [facility] ENDOSCOPY;  Service: Endoscopy;  Laterality: N/A;  . HERNIA REPAIR    . IMPLANTABLE CARDIOVERTER DEFIBRILLATOR (ICD) GENERATOR CHANGE N/A 02/06/2012   Procedure: ICD GENERATOR CHANGE;  Surgeon: [name], MD;  Location: [facility] C[facility] LAB;  Service: Cardiovascular;  Laterality: N/A;  . INSERT / REPLACE / REMOVE PACEMAKER    . MASS BIOPSY Left    neck mass  . MITRAL VALVE REPLACEMENT Right 07/17/2008   34mm Medtronic Mosaic porcine bioprosthesis  . PENILE PROSTHESIS IMPLANT    . RIGHT HEART CATHETERIZATION N/A 08/16/2013   Procedure: RIGHT HEART CATH;  Surgeon: Josue Hector, MD;  Location: [facility] CATH LAB;  Service: Cardiovascular;  Laterality: N/A;  . TEE WITHOUT CARDIOVERSION N/A 09/06/2016   Procedure: TRANSESOPHAGEAL ECHOCARDIOGRAM (TEE);  Surgeon: Dorothy Spark, MD;  Location: [facility] ENDOSCOPY;  Service: Cardiovascular;  Laterality: N/A;  . THORACOTOMY Right 1970's   spontaneous pneumothorax - while in the [place]  . TOE SURGERY     left foot hammer toe  . TONSILLECTOMY    . VIDEO ASSISTED THORACOSCOPY (VATS)/WEDGE RESECTION Left 11/29/2013   Procedure: Video assisted thoracoscopy for wedge resection; mini thoracotomy;  Surgeon: [name], MD;  Location: [facility] OR;  Service: Thoracic;  Laterality: Left;      Inpatient Medications: Scheduled Meds: . sodium chloride   Intravenous Once  . atorvastatin  10 mg Oral Daily  . bethanechol  10 mg Oral TID  . Chlorhexidine Gluconate Cloth  6 each Topical Daily  . citalopram  10 mg Oral Daily  . digoxin  0.125 mg Oral Daily  . feeding supplement  1 Container Oral Q24H  . feeding supplement (KATE FARMS STANDARD 1.4)  325 mL Oral Q24H  . insulin aspart  0-5 Units Subcutaneous QHS  . insulin aspart  0-9 Units Subcutaneous TID WC  . ipratropium-albuterol  3 mL Nebulization TID  . mouth rinse  15 mL Mouth Rinse BID  .  pantoprazole  40 mg Oral Daily  . tamsulosin  0.4 mg Oral QPC supper  . cyanocobalamin  1,000 mcg Oral Daily   Continuous Infusions:  PRN Meds: albuterol, ondansetron (ZOFRAN) IV, oxyCODONE-acetaminophen  Allergies:    Allergies  Allergen Reactions  . Anticoagulant Compound Other (See Comments)    Pt had intracranial bleed, therefore all anticoagulation is contraindicated per Dr. Ron Parker  . Other Other (See Comments)    Per Dr. Halford Chessman (Surgeon): stated that the patient cannot be put under for any surgery, as he has an enlarged aorta. He would stand only a 50/50 chance of surviving. He has lung issues, diminished lung tissue, COPD, and emphysema.  . Warfarin Sodium Other (See Comments)    Pt had intracranial bleed, therefore all anticoagulation is contraindicated per Dr. Ron Parker    Social History:   Social History  Socioeconomic History  . Marital status: Married    Spouse name: [name]  . Number of children: 2  . Years of education: College  . Highest education level: Not on file  Occupational History  . 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 Smoking status: Former Smoker    Packs/day: 1.00    Years: 45.00    Pack years: 45.00    Types: Cigarettes    Quit date: 2011    Years since quitting: 10.4  . Smokeless tobacco: Never Used  Vaping Use  . Vaping Use: Never used  Substance and Sexual Activity  . Alcohol use: No    Alcohol/week: 0.0 standard drinks    Comment: 08/14/2013 "used to drink beer; quit:in 1982"  . Drug use: No  . Sexual activity: Yes  Other Topics Concern  . Not on file  Social History Narrative   Patient lives at home with his spouse.   Worked for the post office   Has two boys and a girl. All live local.    1 Mining engineer   Social Determinants of Health   Financial Resource Strain: Low Risk   . Difficulty of Paying Living Expenses: Not hard at all  Food Insecurity: No Food Insecurity  .  Worried About Charity fundraiser in the Last Year: Never true  . Ran Out of Food in the Last Year: Never true  Transportation Needs: No Transportation Needs  . Lack of Transportation (Medical): No  . Lack of Transportation (Non-Medical): No  Physical Activity: Inactive  . Days of Exercise per Week: 0 days  . Minutes of Exercise per Session: 0 min  Stress: No Stress Concern Present  . Feeling of Stress : Only a little  Social Connections: Unknown  . Frequency of Communication with Friends and Family: More than three times a week  . Frequency of Social Gatherings with Friends and Family: Three times a week  . Attends Religious Services: Not on file  . Active Member of Clubs or Organizations: Not on file  . Attends Archivist Meetings: Not on file  . Marital Status: Married  Human resources officer Violence:   . Fear of Current or Ex-Partner:   . Emotionally Abused:   Marland Kitchen Physically Abused:   . Sexually Abused:     Family History:    Family History  Problem Relation Age of Onset  . Stomach cancer Father   . Stroke Mother      ROS:  Please see the history of present illness.   All other ROS reviewed and negative.     Physical Exam/Data:   Vitals:   12/29/19 0200 12/29/19 0300 12/29/19 0400 12/29/19 0500  BP: (!) 112/50  (!) 127/49   Pulse: 70 74 73 74  Resp: 19 17 16 17   Temp:   98.4 F (36.9 C)   TempSrc:   Oral   SpO2: 94% 98% 90% 94%  Weight:    71.2 kg  Height:        Intake/Output Summary (Last 24 hours) at 12/29/2019 0640 Last data filed at 12/29/2019 0357 Gross per 24 hour  Intake 452.33 ml  Output 1300 ml  Net -847.67 ml   Last 3 Weights 12/29/2019 12/28/2019 12/27/2019  Weight (lbs) 156 lb 15.5 oz 158 lb 15.2 oz 135 lb 12.9 oz  Weight (kg) 71.2 kg 72.1 kg 61.6 kg     Body mass index is  20.71 kg/m.  General:  Chronically ill appearing HEENT: normal Neck: + JVD Cardiac:  normal S1, S2; RRR; no murmur  Lungs:  Bibasilar crackles Abd: soft Ext:  trace edema Musculoskeletal:  No deformities Skin: warm and dry  Neuro:  , no focal abnormalities noted Psych:  Normal affect   EKG:  The EKG was personally reviewed and demonstrates:  Atrial flutter, BiV paced, rate 74 Telemetry:  Telemetry was personally reviewed and demonstrates:  AFL, BiV paced, rate 70s.  5 beat run NSVT  Relevant CV Studies: TTE 11/14/19: 1. LVEF is severely depressed with diffuse hypokinesis, worse in the  septal and apical walls Compared to report from Nov 2020 echo, LVEF is  worse (apical views foreshortened in 2020 exam which makes assessment  difficult). Left ventricular ejection  fraction, by estimation, is 25 to 30%. The left ventricle has severely  decreased function. The left ventricle demonstrates global hypokinesis.  There is mild left ventricular hypertrophy. Left ventricular diastolic  parameters are indeterminate.  2. Right ventricular systolic function is moderately reduced. The right  ventricular size is severely enlarged. There is moderately elevated  pulmonary artery systolic pressure.  3. Left atrial size was severely dilated.  4. Right atrial size was severely dilated.  5. A 33 mm Medtronic Mosai bioprosthesisiis present Peak and mean  gradients through the vave are 15 and 6 mm Hg respectively, both lower  than previous echo report in 2020.Marland Kitchen The mitral valve has been  repaired/replaced. Trivial mitral valve regurgitation.  6. The aortic valve is abnormal. Aortic valve regurgitation is not  visualized. Mild to moderate aortic valve sclerosis/calcification is  present, without any evidence of aortic stenosis.  7. Aortic root is 51 mm Previous echo measured echo above this; current  exam did not image that high.. Aortic dilatation noted. Aneurysm of the  aortic root. There is moderate to severe dilatation of the aortic root  measuring 51 mm.  8. The inferior vena cava is dilated in size with <50% respiratory  variability, suggesting  right atrial pressure of 15 mmHg.   Laboratory Data:  High Sensitivity Troponin:   Recent Labs  Lab 12/06/19 1824 12/06/19 2140  TROPONINIHS 19* 19*     Chemistry Recent Labs  Lab 12/27/19 0203 12/28/19 0135 12/29/19 0211  NA 133* 133* 133*  K 4.5 4.6 4.9  CL 98 99 94*  CO2 28 30 30   GLUCOSE 191* 170* 210*  BUN 24* 28* 27*  CREATININE 0.88 0.78 0.66  CALCIUM 8.5* 8.2* 8.1*  GFRNONAA >60 >60 >60  GFRAA >60 >60 >60  ANIONGAP 7 4* 9    Recent Labs  Lab 12/27/19 0203 12/28/19 0135 12/29/19 0211  PROT 5.7* 5.5* 5.5*  ALBUMIN 2.9* 2.7* 2.6*  AST 9* 11* 8*  ALT 7 8 8   ALKPHOS 71 71 70  BILITOT 0.9 0.8 0.8   Hematology Recent Labs  Lab 12/28/19 0135 12/28/19 0135 12/28/19 1035 12/28/19 1634 12/29/19 0211  WBC 10.5  --  10.4  --  10.6*  RBC 2.75*  --  2.86*  --  2.92*  HGB 7.2*   < > 7.4* 8.3* 7.7*  HCT 24.3*   < > 25.5* 27.8* 26.1*  MCV 88.4  --  89.2  --  89.4  MCH 26.2  --  25.9*  --  26.4  MCHC 29.6*  --  29.0*  --  29.5*  RDW 22.4*  --  22.3*  --  21.4*  PLT 191  --  195  --  207   < > = values in this interval not displayed.   BNP Recent Labs  Lab 12/29/19 0211  BNP 1,473.9*    DDimer No results for input(s): DDIMER in the last 168 hours.   Radiology/Studies:  DG CHEST PORT 1 VIEW  Result Date: 12/27/2019 CLINICAL DATA:  Shortness of breath. EXAM: PORTABLE CHEST 1 VIEW COMPARISON:  December 24, 2019 FINDINGS: There is a dual lead AICD. Mild diffusely increased interstitial lung markings are seen with mild areas of atelectasis and/or infiltrate seen within the bilateral mid lung fields and right lung base. There is a small right pleural effusion. No pneumothorax is identified. The cardiac silhouette is moderately enlarged. The visualized skeletal structures are unremarkable. IMPRESSION: 1. Mild areas of atelectasis and/or infiltrate seen within the bilateral mid lung fields and right lung base. 2. Small right pleural effusion. Electronically Signed    By: Virgina Norfolk M.D.   On: 12/27/2019 20:27   {    Assessment and Plan:   Acute on Chronic combined systolic and diastolic heart failure: EF 25 to 30%.  Status post BiVICD. Started on IV lasix.  On digoxin 0.125 mg daily.  Has been unable to tolerate GDMT due to hypotension. - Recommend increasing IV lasix to 80 mg BID  Permanent atrial fibrillation: s/p AV nodal ablation and BiV ICD.  Not on anticoagulation given history of ICH and now with GI bleed.  S/p LAA clip.  Acute on chronic hypoxic respiratory failure: Severe COPD on baseline 5 L, currently up to 15 L HFNC.  Started on IV Lasix as above.  Sinus of Valsalva aneurysm: measures 64mm.  Has been seen by cardiothoracic surgery, not on operative candidate  GI bleed: Cecal mass on CT abdomen pelvis 12/06/2019.  Having ongoing bleeding.  Goals of care: agree with palliative medicine consult and ongoing goals of care discussion.   For questions or updates, please contact [place] Please consult [email or web address] for contact info under    Signed, Donato Heinz, MD  12/29/2019 6:40 AM

## 2019-12-29 NOTE — Progress Notes (Addendum)
PROGRESS NOTE    [name]  MGQ:[id number] DOB: [birth year] DOA: 12/24/2019 PCP: [name], NP   Brief Narrative:  HPI per Dr. Chesley Mires on 12/24/19 74 yo male presented from rehab center with abdominal pain and maroon colored stool.  PCCM asked to admit to ICU.  He had CT abd/pelvis 12/06/19 that showed possible cecal mass.  History of present illness   Denies chest pain, dyspnea.  Has nausea but no vomiting.  Feels weak.  Never had this before.  He is DNR but agreeable to other medical therapies.  Past Medical History  Combined CHF with EF 25%, s/p AICD, Severe COPD/emphysema, chronic respiratory failure on 5 liters, Permanent A fib on ASA, ICH, mitral regurg s/p bioprosthetic MVR, NSCLC s/p wedge resection, DM type 2, HTN, HLD  **Interim History He had a CT angio of the abdomen pelvis which showed no evidence of any GI bleeding identified.  He continues to have some maroon stools.  He received 2 units of PRBCs.  After discussion with GI patient and the family did now want to pursue colonoscopy and favored conservative management only.  GI signed off the case.  His O2 saturations continued to drop so he had to be bumped up on his oxygen requirement and had to be placed on a NRB last night but now on 10 Liters.  Palliative care was consulted for further evaluation recommendations. He continues to have bloody bowel movements and hemoglobin started dropping so he is transfused another unit of PRBCs.  He appeared to be volume overload and respiratory status is worsening so he was given IV Lasix 20 mg.  Have consulted cardiology for further evaluation of suspected acute on chronic combined systolic and diastolic CHF.  Palliative care consulted for goals of care discussion after today's discussion Dr. Domingo Cocking recommends to continue current interventions and follow his clinical course from the 24 to 48 hours and have further discussion based on his progress.  Cardiology evaluated the patient  today and they recommended IV Lasix 80 mg twice daily for now.  We will continue to watch his clinical progress and he continues to have GI bleeding.  Blood count has dropped slightly.  Assessment & Plan:   Active Problems:   Pressure ulcer   Gastrointestinal hemorrhage   Acute blood loss anemia   Protein-calorie malnutrition, severe  Lower GI bleeding with concern for cecal mass noted on CT abd/pelvis 12/06/19. -He has decided not to pursue endoscopic procedures, and would rather focus on conservative management -Continue  f/u CBC as he continues bloody bowel movement and he had to after his H&H was drawn this morning -F/u Hb and transfuse if < 7 or significant bleeding;' he is already transfused  2 units of PRBCs: Total of 3 total -Patient's hemoglobin/hematocrit dropped and he was transfused another unit of PRBCs; H&H after his blood transfusion now 8.3/27.8 and would like to keep a closer to 10 given his history of heart failure; now hemoglobin/hematocrit is 10.7/26.1 -He continues to have bloody bowel movements  -Advance diet as tolerated he is now on a regular diet -Have consulted palliative care for further goals of care discussion and will continue current course -Continue to Trend CBCs   Acute on chronic combined systolic and diastolic CHF with an EF of 25 to 30% status post AICD and pacemaker CAD Permanent A fib,  WHO group 2 and 3 pulmonary HTN HLD. -Continue digoxin 0.125 mg p.o. daily, atorvastatin 10 mg p.o. daily -hold outpt ASA,  -  We will need to resume Lasix but his blood pressure is marginal in the lower side to give him IV 20 mg this a.m. and will give another 20 this evening because of his hypotension; cardiology is now consulted and recommending IV Lasix 80 mg twice daily -IV fluid has now been discontinued did receive some volume from his blood transfusion -Cardiology has been consulted for further evaluation and management and appreciate Dr. Elder Love evaluation;  patient's BNP was 1473.9 -I's and O's and daily weights; he is -1.755 L since admission; his weight has gone up to 156 from 136 on admission likely in the setting of volume -Continue to monitor on telemetry in the [facility] unit for now -No longer on anticoagulation due to a past ICH -Repeat chest x-ray in a.m.  Severe COPD/emphysema with at least 5 L of nasal cannula Acute on chronic hypoxic respiratory failure, worsening History of non-small cell lung cancer status post VATS -C/w scheduled duoneb and with as needed albuterol nebs every 4 hours as needed wheezing - goal SpO2 88 to 95% -SpO2: 90 % O2 Flow Rate (L/min): 14 L/min (was 97% on 15 lpm- RN aware) -His O2 saturations worsened and he has been saturating in the low 80s so we will need to increase his oxygen requirement and monitor carefully -Continue to hold outpt breztri -Have consulted palliative care for further goals of care discussion and his respiratory status is likely worsening in the setting of above and likely from heart failure -We will have cardiology assist with diuresis given that his blood pressure is on the lower side.  In the interim be given IV Lasix 20 mg and now Cardiology has increased this to IV 80 mg BID -WBC went from 10.4 -> 10.6 but he remains Afebrile  -CXR this AM showed "Bilateral pulmonary infiltrates are stable on the left and mildly worsened in the right mid lung in the interval. Small right effusion. Stable cardiomegaly. No other changes."; Feel these are related to Heart failure   DM type II poorly controlled with hyperglycemia. - SSI - hold metformin -CBGs have been ranging from 144-173  Hx of Depression. - resume Citalopram  BPH, urine retention. -C/w Bethanechol, Tamsuolsin  Deconditioning. - PT/OT further evaluate and treat and recommending SNF again   History of alcoholic cirrhosis -Has not been drinking for over a decade -Continue monitor LFTs and bilirubin and they are  normal  Goals of care. - DNR; Does not want further work up for his GIB - Will consult Palliative Care for further evaluation and further GOC Discussion we will continue current course and have consulted cardiology for further assistance given his worsening respiratory status and what appears to be a volume overload on his x-ray vs having a PNA  Hyponatremia -Mild at 133 again today -Continue to Monitor and Trend   Stage 1 Coccyx Ulcer, pOA -Continue Wound Care  DVT prophylaxis: SCDs given his GI bleeding Code Status: DO NOT RESUSCITATE  Family Communication: Discussed with Daughter at bedside  Disposition Plan: Pending further clinical stabilization and improvement of his hypoxia and bleeding.  We have consulted palliative care for goals of care discussion and will need to ensure stability prior to D/C  Status is: Inpatient  Remains inpatient appropriate because:Unsafe d/c plan, IV treatments appropriate due to intensity of illness or inability to take PO and Inpatient level of care appropriate due to severity of illness   Dispo: The patient is from: Home  Anticipated d/c is to: TBD              Anticipated d/c date is: 2 days              Patient currently is not medically stable to d/c.  Consultants:   Gastroenterology  PCCM Transfer  Palliative Care   Cardiology   Procedures: None   Antimicrobials:  Anti-infectives (From admission, onward)   None     Subjective: Seen and evaluated at bedside and is to be short of breath but feels a little bit better.  Still having some bloody bowel movements.  No nausea or vomiting.  Cardiology recommending IV Lasix 80 mg twice daily.  Daughter at bedside.  Patient wants to continue to watch his clinical course and does not want to make a decision yet and does not want to go to an endoscopy or colonoscopy given that he does not believe put to sleep as he is a DNR and may not come off the vent given his chronic  comorbidities.  Objective: Vitals:   12/29/19 1000 12/29/19 1100 12/29/19 1200 12/29/19 1351  BP: (!) 122/51  (!) 120/58   Pulse: 74 74 73   Resp: 17 14 (!) 22   Temp:    98.2 F (36.8 C)  TempSrc:    Oral  SpO2: 90%     Weight:      Height:        Intake/Output Summary (Last 24 hours) at 12/29/2019 1642 Last data filed at 12/29/2019 1000 Gross per 24 hour  Intake --  Output 1450 ml  Net -1450 ml   Filed Weights   12/27/19 0500 12/28/19 0437 12/29/19 0500  Weight: 61.6 kg 72.1 kg 71.2 kg   Examination: Physical Exam:  Constitutional: The patient is a thin elderly Caucasian male currently requiring quite a bit of oxygen but in no respiratory distress feels his respiratory status is mildly stable to a little bit better. Eyes: Lids and conjunctivae normal, sclerae anicteric  ENMT: External Ears, Nose appear normal. Grossly normal hearing.  Neck: Appears normal, supple, no cervical masses, normal ROM, no appreciable thyromegaly; has some JVD Respiratory: Diminished to auscultation bilaterally with coarse breath sounds and some crackles; Normal respiratory effort and patient is not tachypenic. No accessory muscle use.  Cardiovascular: Tachycardic rate but irregularly irregular rhythm, has mild extremity edema. Abdomen: Soft, non-tender, non-distended. Bowel sounds positive.  GU: Deferred. Musculoskeletal: No clubbing / cyanosis of digits/nails. No joint deformity upper and lower extremities.  Skin: No rashes, lesions, ulcers on limited skin evaluation. No induration; Warm and dry.  Neurologic: CN 2-12 grossly intact with no focal deficits. Romberg sign and cerebellar reflexes not assessed.  Psychiatric: Normal judgment and insight. Alert and oriented x 3. Normal mood and appropriate affect.   Data Reviewed: I have personally reviewed following labs and imaging studies  CBC: Recent Labs  Lab 12/24/19 0525 12/24/19 1705 12/26/19 0521 12/26/19 0521 12/27/19 0203  12/28/19 0135 12/28/19 1035 12/28/19 1634 12/29/19 0211  WBC 7.9   < > 7.6  --  11.4* 10.5 10.4  --  10.6*  NEUTROABS 6.7  --   --   --  9.8* 8.6*  --   --  8.6*  HGB 6.5*   < > 7.5*   < > 7.8* 7.2* 7.4* 8.3* 7.7*  HCT 22.5*   < > 24.7*   < > 26.0* 24.3* 25.5* 27.8* 26.1*  MCV 83.6   < > 87.3  --  87.8  88.4 89.2  --  89.4  PLT 177   < > 186  --  185 191 195  --  207   < > = values in this interval not displayed.   Basic Metabolic Panel: Recent Labs  Lab 12/25/19 0500 12/26/19 0756 12/27/19 0203 12/28/19 0135 12/29/19 0211  NA 134* 134* 133* 133* 133*  K 4.9 4.3 4.5 4.6 4.9  CL 97* 99 98 99 94*  CO2 28 29 28 30 30   GLUCOSE 151* 166* 191* 170* 210*  BUN 20 20 24* 28* 27*  CREATININE 0.50* 0.64 0.88 0.78 0.66  CALCIUM 8.2* 8.4* 8.5* 8.2* 8.1*  MG  --  2.1 2.2 2.1 2.1  PHOS  --  3.4 4.4 3.9 3.0   GFR: Estimated Creatinine Clearance: 84.1 mL/min (by C-G formula based on SCr of 0.66 mg/dL). Liver Function Tests: Recent Labs  Lab 12/24/19 0525 12/26/19 0756 12/27/19 0203 12/28/19 0135 12/29/19 0211  AST 11* 10* 9* 11* 8*  ALT 8 9 7 8 8   ALKPHOS 53 66 71 71 70  BILITOT 0.5 1.1 0.9 0.8 0.8  PROT 4.7* 5.4* 5.7* 5.5* 5.5*  ALBUMIN 2.5* 2.9* 2.9* 2.7* 2.6*   Recent Labs  Lab 12/24/19 0525  LIPASE 25   No results for input(s): AMMONIA in the last 168 hours. Coagulation Profile: Recent Labs  Lab 12/24/19 0525  INR 1.2   Cardiac Enzymes: No results for input(s): CKTOTAL, CKMB, CKMBINDEX, TROPONINI in the last 168 hours. BNP (last 3 results) No results for input(s): PROBNP in the last 8760 hours. HbA1C: No results for input(s): HGBA1C in the last 72 hours. CBG: Recent Labs  Lab 12/28/19 1225 12/28/19 1618 12/28/19 2118 12/29/19 0731 12/29/19 1230  GLUCAP 190* 216* 158* 173* 144*   Lipid Profile: No results for input(s): CHOL, HDL, LDLCALC, TRIG, CHOLHDL, LDLDIRECT in the last 72 hours. Thyroid Function Tests: No results for input(s): TSH, T4TOTAL,  FREET4, T3FREE, THYROIDAB in the last 72 hours. Anemia Panel: No results for input(s): VITAMINB12, FOLATE, FERRITIN, TIBC, IRON, RETICCTPCT in the last 72 hours. Sepsis Labs: Recent Labs  Lab 12/24/19 0547  LATICACIDVEN 2.6*    Recent Results (from the past 240 hour(s))  SARS Coronavirus 2 by RT PCR (hospital order, performed in [facility] hospital lab) Nasopharyngeal Nasopharyngeal Swab     Status: None   Collection Time: 12/24/19  7:29 AM   Specimen: Nasopharyngeal Swab  Result Value Ref Range Status   SARS Coronavirus 2 NEGATIVE NEGATIVE Final    Comment: (NOTE) SARS-CoV-2 target nucleic acids are NOT DETECTED.  The SARS-CoV-2 RNA is generally detectable in upper and lower respiratory specimens during the acute phase of infection. The lowest concentration of SARS-CoV-2 viral copies this assay can detect is 250 copies / mL. A negative result does not preclude SARS-CoV-2 infection and should not be used as the sole basis for treatment or other patient management decisions.  A negative result may occur with improper specimen collection / handling, submission of specimen other than nasopharyngeal swab, presence of viral mutation(s) within the areas targeted by this assay, and inadequate number of viral copies (<250 copies / mL). A negative result must be combined with clinical observations, patient history, and epidemiological information.  Fact Sheet for Patients:   StrictlyIdeas.no  Fact Sheet for Healthcare Providers: BankingDealers.co.za  This test is not yet approved or  cleared by the [place] FDA and has been authorized for detection and/or diagnosis of SARS-CoV-2 by FDA under an Emergency Use Authorization (  EUA).  This EUA will remain in effect (meaning this test can be used) for the duration of the COVID-19 declaration under Section 564(b)(1) of the Act, 21 U.S.C. section 360bbb-3(b)(1), unless the authorization is  terminated or revoked sooner.  Performed at [facility], [place] [address], [place], [place] [address]   MRSA PCR Screening     Status: None   Collection Time: 12/24/19 11:05 AM   Specimen: Nasal Mucosa; Nasopharyngeal  Result Value Ref Range Status   MRSA by PCR NEGATIVE NEGATIVE Final    Comment:        The GeneXpert MRSA Assay (FDA approved for NASAL specimens only), is one component of a comprehensive MRSA colonization surveillance program. It is not intended to diagnose MRSA infection nor to guide or monitor treatment for MRSA infections. Performed at [facility], [place] [address], [place], [place] [address]      RN Pressure Injury Documentation: Pressure Injury 12/08/19 Coccyx Mid;Right;Left Stage 1 -  Intact skin with non-blanchable redness of a localized area usually over a bony prominence. redness or discoloration on coccyx (Active)  12/08/19 0700  Location: Coccyx  Location Orientation: Mid;Right;Left  Staging: Stage 1 -  Intact skin with non-blanchable redness of a localized area usually over a bony prominence.  Wound Description (Comments): redness or discoloration on coccyx  Present on Admission: Yes     Pressure Injury 12/24/19 Coccyx Left;Right Stage 1 -  Intact skin with non-blanchable redness of a localized area usually over a bony prominence. (Active)  12/24/19   Location: Coccyx  Location Orientation: Left;Right  Staging: Stage 1 -  Intact skin with non-blanchable redness of a localized area usually over a bony prominence.  Wound Description (Comments):   Present on Admission: Yes     Pressure Injury 12/28/19 Ear Left Stage 2 -  Partial thickness loss of dermis presenting as a shallow open injury with a red, pink wound bed without slough. back of L ear lobe, redness with broken skin (Active)  12/28/19 0000  Location: Ear  Location Orientation: Left  Staging: Stage 2 -  Partial thickness loss of dermis presenting as a  shallow open injury with a red, pink wound bed without slough.  Wound Description (Comments): back of L ear lobe, redness with broken skin  Present on Admission: No     Estimated body mass index is 20.71 kg/m as calculated from the following:   Height as of this encounter: 6\' 1"  (1.854 m).   Weight as of this encounter: 71.2 kg.  Malnutrition Type:  Nutrition Problem: Severe Malnutrition Etiology: chronic illness (COPD and lung cancer)   Malnutrition Characteristics:  Signs/Symptoms: moderate fat depletion, moderate muscle depletion, severe fat depletion, severe muscle depletion   Nutrition Interventions:  Interventions: Boost Breeze, Magic cup, MVI, Other (Comment) [employer])   Radiology Studies: DG CHEST PORT 1 VIEW  Result Date: 12/29/2019 CLINICAL DATA:  Shortness of breath EXAM: PORTABLE CHEST 1 VIEW COMPARISON:  December 27, 2019 FINDINGS: Bilateral pulmonary infiltrates are stable on the left and mildly worsened in the right mid lung in the interval. Small right effusion. Stable cardiomegaly. No other changes. IMPRESSION: Mild worsening of right-sided pulmonary infiltrate. Stable left infiltrate. No other changes. Electronically Signed   By: Dorise Bullion III M.D   On: 12/29/2019 11:12   DG CHEST PORT 1 VIEW  Result Date: 12/27/2019 CLINICAL DATA:  Shortness of breath. EXAM: PORTABLE CHEST 1 VIEW COMPARISON:  December 24, 2019 FINDINGS: There is a dual  lead AICD. Mild diffusely increased interstitial lung markings are seen with mild areas of atelectasis and/or infiltrate seen within the bilateral mid lung fields and right lung base. There is a small right pleural effusion. No pneumothorax is identified. The cardiac silhouette is moderately enlarged. The visualized skeletal structures are unremarkable. IMPRESSION: 1. Mild areas of atelectasis and/or infiltrate seen within the bilateral mid lung fields and right lung base. 2. Small right pleural effusion. Electronically Signed   By:  Virgina Norfolk M.D.   On: 12/27/2019 20:27    Scheduled Meds:  sodium chloride   Intravenous Once   atorvastatin  10 mg Oral Daily   bethanechol  10 mg Oral TID   Chlorhexidine Gluconate Cloth  6 each Topical Daily   citalopram  10 mg Oral Daily   digoxin  0.125 mg Oral Daily   feeding supplement  1 Container Oral Q24H   feeding supplement ([employer] STANDARD 1.4)  325 mL Oral Q24H   furosemide  80 mg Intravenous BID   insulin aspart  0-5 Units Subcutaneous QHS   insulin aspart  0-9 Units Subcutaneous TID WC   ipratropium-albuterol  3 mL Nebulization TID   mouth rinse  15 mL Mouth Rinse BID   pantoprazole  40 mg Oral Daily   tamsulosin  0.4 mg Oral QPC supper   cyanocobalamin  1,000 mcg Oral Daily   Continuous Infusions:   LOS: 5 days   Kerney Elbe, DO [facility] PAGER is on AMION  If 7PM-7AM, please contact night-coverage [email or web address]

## 2019-12-29 NOTE — Progress Notes (Signed)
Paged on call MD about 10 run vtach Pt experienced awaiting new orders if any.

## 2019-12-30 ENCOUNTER — Inpatient Hospital Stay (HOSPITAL_COMMUNITY): Payer: Medicare Other

## 2019-12-30 DIAGNOSIS — R0902 Hypoxemia: Secondary | ICD-10-CM

## 2019-12-30 DIAGNOSIS — R578 Other shock: Secondary | ICD-10-CM

## 2019-12-30 DIAGNOSIS — R0602 Shortness of breath: Secondary | ICD-10-CM

## 2019-12-30 LAB — COMPREHENSIVE METABOLIC PANEL
ALT: 8 U/L (ref 0–44)
AST: 8 U/L — ABNORMAL LOW (ref 15–41)
Albumin: 2.5 g/dL — ABNORMAL LOW (ref 3.5–5.0)
Alkaline Phosphatase: 67 U/L (ref 38–126)
Anion gap: 7 (ref 5–15)
BUN: 21 mg/dL (ref 8–23)
CO2: 33 mmol/L — ABNORMAL HIGH (ref 22–32)
Calcium: 7.9 mg/dL — ABNORMAL LOW (ref 8.9–10.3)
Chloride: 95 mmol/L — ABNORMAL LOW (ref 98–111)
Creatinine, Ser: 0.65 mg/dL (ref 0.61–1.24)
GFR calc Af Amer: >60 mL/min (ref >60)
GFR calc non Af Amer: >60 mL/min (ref >60)
Glucose, Bld: 148 mg/dL — ABNORMAL HIGH (ref 70–99)
Potassium: 4.7 mmol/L (ref 3.5–5.1)
Sodium: 135 mmol/L (ref 135–145)
Total Bilirubin: 1.4 mg/dL — ABNORMAL HIGH (ref 0.3–1.2)
Total Protein: 5.5 g/dL — ABNORMAL LOW (ref 6.5–8.1)

## 2019-12-30 LAB — MAGNESIUM: Magnesium: 1.9 mg/dL (ref 1.7–2.4)

## 2019-12-30 LAB — GLUCOSE, CAPILLARY
Glucose-Capillary: 162 mg/dL — ABNORMAL HIGH (ref 70–99)
Glucose-Capillary: 278 mg/dL — ABNORMAL HIGH (ref 70–99)
Glucose-Capillary: 232 mg/dL — ABNORMAL HIGH (ref 70–99)
Glucose-Capillary: 100 mg/dL — ABNORMAL HIGH (ref 70–99)
Glucose-Capillary: 94 mg/dL (ref 70–99)

## 2019-12-30 LAB — CBC WITH DIFFERENTIAL/PLATELET
Abs Immature Granulocytes: 0.05 10*3/uL (ref 0.00–0.07)
Basophils Absolute: 0 10*3/uL (ref 0.0–0.1)
Basophils Relative: 0 %
Eosinophils Absolute: 0.3 10*3/uL (ref 0.0–0.5)
Eosinophils Relative: 3 %
HCT: 26.8 % — ABNORMAL LOW (ref 39.0–52.0)
Hemoglobin: 8.2 g/dL — ABNORMAL LOW (ref 13.0–17.0)
Immature Granulocytes: 1 %
Lymphocytes Relative: 5 %
Lymphs Abs: 0.5 10*3/uL — ABNORMAL LOW (ref 0.7–4.0)
MCH: 26.7 pg (ref 26.0–34.0)
MCHC: 30.6 g/dL (ref 30.0–36.0)
MCV: 87.3 fL (ref 80.0–100.0)
Monocytes Absolute: 1.4 10*3/uL — ABNORMAL HIGH (ref 0.1–1.0)
Monocytes Relative: 14 %
Neutro Abs: 7.9 10*3/uL — ABNORMAL HIGH (ref 1.7–7.7)
Neutrophils Relative %: 77 %
Platelets: 233 10*3/uL (ref 150–400)
RBC: 3.07 MIL/uL — ABNORMAL LOW (ref 4.22–5.81)
RDW: 21.6 % — ABNORMAL HIGH (ref 11.5–15.5)
WBC: 10.2 10*3/uL (ref 4.0–10.5)
nRBC: 0 % (ref 0.0–0.2)

## 2019-12-30 LAB — PHOSPHORUS: Phosphorus: 3.1 mg/dL (ref 2.5–4.6)

## 2019-12-30 MED ORDER — OXYCODONE-ACETAMINOPHEN 5-325 MG PO TABS
1.0000 | ORAL_TABLET | ORAL | Status: DC | PRN
  Administered 2019-12-30: 1 via ORAL
  Filled 2019-12-30 (×2): qty 1

## 2019-12-30 NOTE — Progress Notes (Signed)
Daily Progress Note   Patient Name: [name]       Date: 12/30/2019 DOB: [birth year]  Age: 74 y.o. MRN#: [id number] Attending Physician: Kerney Elbe, DO Primary Care Physician: [name], NP Admit Date: 12/24/2019  Reason for Consultation/Follow-up: Establishing goals of care  Subjective: Received call from bedside RN that patient's daughter was present at the bedside and wished to discuss [name]'s case.  I met today with patient's daughter, [name].  I introduced palliative care as specialized medical care for people living with serious illness. It focuses on providing relief from the symptoms and stress of a serious illness. The goal is to improve quality of life for both the patient and the family.  We discussed [name] clinical condition including his multiple comorbidities that are contributing to his current state.  I reviewed with her concerns regarding what may be continued rectal bleeding (I am still not certain how much he is actively bleeding no plans for colonoscopy), emphysema, CHF with increasing fluid overload, and worsened hypoxia.  [name] had many questions regarding conversation that I had with [name] yesterday.  I reviewed my conversation with her and potential pathways moving forward including stabilization of [name] condition in which case goal moving forward would likely be back to skilled rehab to complete the number of rehab days that he has left.  [name] understands at that point in time there would either need to pay co-pay for him to stay at skilled facility or work to transition home.  We discussed that once he is medically maximized, [name] had expressed interest in either palliative care or hospice services at home.  He will need to regain  functional status and to go to rehab to assist with this prior to discharging home.  We also discussed that if his condition continues to decline, [name] may be best served by focusing on his comfort with consideration for transition to residential hospice.  Discussed that we may have a better idea about his overall trajectory of the next day or 2 as we work to [employer] him while also observing if he continues to bleed.  He reports that [name] had also been trying to get resources through the [place] for [name].  She reports that [name] was told that he would need to likely  move to [place] if he wanted to stay in any sort of long[facility]term [facility] facility.  They reports that she does not find this to be a realistic option as [name] would not be able to be visited by his family there.  Length of Stay: 6  Current Medications: Scheduled Meds:  . sodium chloride   Intravenous Once  . atorvastatin  10 mg Oral Daily  . bethanechol  10 mg Oral TID  . Chlorhexidine Gluconate Cloth  6 each Topical Daily  . citalopram  10 mg Oral Daily  . digoxin  0.125 mg Oral Daily  . feeding supplement  1 Container Oral Q24H  . feeding supplement (KATE FARMS STANDARD 1.4)  325 mL Oral Q24H  . furosemide  80 mg Intravenous BID  . insulin aspart  0-5 Units Subcutaneous QHS  . insulin aspart  0-9 Units Subcutaneous TID WC  . ipratropium-albuterol  3 mL Nebulization TID  . mouth rinse  15 mL Mouth Rinse BID  . pantoprazole  40 mg Oral Daily  . tamsulosin  0.4 mg Oral QPC supper  . cyanocobalamin  1,000 mcg Oral Daily    Continuous Infusions:   PRN Meds: albuterol, ondansetron (ZOFRAN) IV, oxyCODONE-acetaminophen  Physical Exam         General: Alert, awake, in no acute distress. On high flow nasal cannula Heart: Regular rate and rhythm. No murmur appreciated. Lungs: Decreased air movement, scattered crackles Abdomen: Soft, nontender, nondistended, positive bowel sounds.  Ext: Trace edema Skin: Warm and  dry Neuro: Grossly intact, nonfocal.   Vital Signs: BP (P) 113/73 (BP Location: Right Arm)   Pulse 74   Temp (!) 97.5 F (36.4 C) (Axillary)   Resp (!) 21   Ht 6' 1" (1.854 m)   Wt 71 kg   SpO2 94%   BMI 20.65 kg/m  SpO2: SpO2: 94 % O2 Device: O2 Device: (P) Nasal Cannula O2 Flow Rate: O2 Flow Rate (L/min): 14 L/min  Intake/output summary:   Intake/Output Summary (Last 24 hours) at 12/30/2019 2633 Last data filed at 12/30/2019 0600 Gross per 24 hour  Intake --  Output 2050 ml  Net -2050 ml   LBM: Last BM Date: 12/29/19 Baseline Weight: Weight: 61.7 kg Most recent weight: Weight: 71 kg       Palliative Assessment/Data:      Patient Active Problem List   Diagnosis Date Noted  . Protein-calorie malnutrition, severe 12/25/2019  . Gastrointestinal hemorrhage 12/24/2019  . Acute blood loss anemia   . Pressure ulcer 12/08/2019  . Depression, recurrent ([place])   . Acute respiratory failure with hypoxia ([place])   . Goals of care, counseling/discussion   . Palliative care by specialist   . Acute exacerbation of CHF (congestive heart failure) ([place]) 11/13/2019  . Elevated troponin   . Chronic atrial fibrillation ([place])   . Weakness generalized   . DNR (do not resuscitate)   . Acute on chronic combined systolic (congestive) and diastolic (congestive) heart failure ([place]) 04/27/2019  . COPD with acute exacerbation ([place]) 08/01/2018  . Pulmonary edema 07/30/2018  . Health care maintenance 05/09/2018  . Gastritis and gastroduodenitis   . Diverticulum of duodenum   . Aortic root dilatation ([place])   . 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 Atherosclerosis of aorta ([place]) 08/11/2016  . Orthostatic hypotension 09/26/2015  . Near syncope 09/24/2015  . Syncope 09/24/2015  . Hepatic cirrhosis ([place]) 09/25/2014  . History of colonic polyps 09/25/2014  . Serum total bilirubin elevated 06/30/2014  .  Follow-up examination, following unspecified surgery 01/27/2014  . Lung cancer ([place])  11/29/2013  . Prosthetic valve dysfunction   . Chronic combined systolic and diastolic CHF (congestive heart failure) ([place])   . Type 2 diabetes mellitus ([place]) 11/04/2013  . Myocardial infarction ([place]) 11/01/2013  . Cardiomyopathy, nonischemic ([place]) 11/01/2013  . Acute on chronic respiratory failure with hypoxia ([place]) 08/15/2013  . H/O intracranial hemorrhage 08/13/2013  . H/O endocarditis 08/13/2013  . H/O: CVA (cerebrovascular accident) 08/13/2013  . Community acquired pneumonia 08/13/2013  . Chronic respiratory failure assoc with chf/ PAH 08/12/2013  . H/O atrioventricular nodal ablation   . S/P MVR (mitral valve replacement)   . Hypertension associated with diabetes ([place])   . Hyperlipidemia associated with type 2 diabetes mellitus ([place])   . Permanent atrial fibrillation   . Ejection fraction < 50%   . ICD (implantable cardioverter-defibrillator), biventricular, in situ   . Renal artery stenosis ([place])   . Pulmonary hypertension ([place])   . Primary cancer of left upper lobe of lung ([place]) 04/20/2009  . COLONIC POLYPS, ADENOMATOUS 03/22/2007  . COPD GOLD II 01/11/2007  . GERD 01/11/2007    Palliative Care Assessment & Plan   Patient Profile: 74 y.o.malewith past medical history of combined CHF with EF 25%, status post AICD, severe COPD/emphysema, chronic respiratory failure on 5 L at skilled facility, permanent A. fib on ASA, intercranial hemorrhage, mitral regurg status post bioprosthetic valve replacement, non-small cell lung cancer status post wedge resection, DM type II, hypertension, hyperlipidemiaadmitted on 6/15/2021with abdominal pain and maroon-colored stool. Of note he recently had CT of abdomen pelvis that showed possible cecal mass. He has declined consideration for colonoscopy as he been told in the past he would be high risk for any sort of sedating procedure.  Also, he has had increasing oxygen needs and is wearing 12 L nasal cannula as well as intermittent  nonrebreather which is up from his baseline of 5 L nasal cannula outside of the hospital.Palliative consulted for goals of care.  Recommendations/Plan:  DNR/DNI  Continue current interventions.  Discussed with patient's daughter today and that he has multiple comorbidities working against him including rectal bleed, CHF, emphysema and worsening hypoxia.  Discussed plan to follow his clinical course for another day or 2 to best determine his likely prognosis moving forward.  His wife expressed yesterday that she is concerned about being able to meet his care needs at home.  I discussed with his daughter today likely pathways include transition back to rehab followed by transition home once his functional status is maximized (or he runs out of rehab days which is due to her current about a week) with consideration for palliative care or hospice to follow him once he transitions back to home.  Also discussed that if his condition continues to acutely worsen, he may be candidate for residential hospice with understanding prognosis will be likely less than 2 weeks.  Plan to follow-up again later this afternoon versus tomorrow with patient's wife  Code Status:    Code Status Orders  (From admission, onward)         Start     Ordered   12/24/19 0843  Do not attempt resuscitation (DNR)  Continuous       Question Answer Comment  In the event of cardiac or respiratory ARREST Do not call a "code blue"   In the event of cardiac or respiratory ARREST Do not perform Intubation, CPR, defibrillation or ACLS   In the event of cardiac  or respiratory ARREST Use medication by any route, position, wound care, and other measures to relive pain and suffering. May use oxygen, suction and manual treatment of airway obstruction as needed for comfort.      12/24/19 0842        Code Status History    Date Active Date Inactive Code Status Order ID Comments User Context   12/17/2019 1137 12/24/2019 0515 DNR [id number]   Gayland Curry, DO Outpatient   12/06/2019 2335 12/13/2019 0055 DNR [id number]  Lenore Cordia, MD ED   11/13/2019 2118 11/25/2019 1857 DNR [id number]  Truddie Hidden, MD ED   11/13/2019 2114 11/13/2019 2118 Full Code [id number]  Truddie Hidden, MD ED   06/07/2019 1321 06/10/2019 2108 DNR [id number]  Karmen Bongo, MD ED   04/27/2019 1937 05/02/2019 1915 DNR [id number]  Lenore Cordia, MD ED   07/30/2018 1722 08/02/2018 1814 DNR [id number]  Cristal Deer, MD Inpatient   09/24/2015 1730 09/27/2015 1751 Full Code [id number]  Kelvin Cellar, MD Inpatient   11/29/2013 1523 12/03/2013 1401 Full Code [id number]  [name], MD Inpatient   08/12/2013 1804 08/18/2013 1820 Full Code [id number]  Barrett, Evelene Croon, PA-C Inpatient   Advance Care Planning Activity    Advance Directive Documentation     Most Recent Value  Type of Advance Directive Out of facility DNR (pink MOST or yellow form)  Pre-existing out of facility DNR order (yellow form or pink MOST form) Pink MOST form placed in chart (order not valid for inpatient use)  "MOST" Form in Place? --       Prognosis:  Guarded  Discharge Planning:  To Be Determined  Care plan was discussed with daughter  Thank you for allowing the Palliative Medicine Team to assist in the care of this patient.   Time In: 0940 Time Out: 1020 Total Time 40 Prolonged Time Billed No      Greater than 50%  of this time was spent counseling and coordinating care related to the above assessment and plan.  Micheline Rough, MD  Please contact Palliative Medicine Team phone at [phone] for questions and concerns.

## 2019-12-30 NOTE — Progress Notes (Signed)
Daily Progress Note   Patient Name: [name]       Date: 12/30/2019 DOB: Mar 19, 1946  Age: 74 y.o. MRN#: [id number] Attending Physician: Kerney Elbe, DO Primary Care Physician: [name], NP Admit Date: 12/24/2019  Reason for Consultation/Follow-up: Establishing goals of care  Subjective: Received call from bedside RN that patient's wife was present at the bedside.  Unfortunately, she suffered from panic attack and had to leave.  I did meet with [name] today.  He is awake, alert, and wearing high flow nasal cannula.  He is eating [place] sandwich at the time of my encounter and reports that he is feeling well.  He states that he found the pain medication that he got earlier today to be helpful in helping both his back pain and shortness of breath.  We discussed his overall situation and concern with continued increase in oxygen requirements.  He reports understanding concern about his multiple comorbidities, but at the same time, he states that he is feeling okay and is therefore wanting to continue with current interventions.  We discussed plan for high flow nasal cannula and that the next step in progression if his condition continued to deteriorate will be for BiPAP.  He indicates to me that he is familiar with BiPAP and has had BiPAP therapy before in the past.  States that he would be okay with BiPAP therapy, "if it gets to the point where I need it."  He does confirm to me again today that he would not want intubation and mechanical ventilation.  I then called and spoke with his wife, [name].  She reports understanding his situation and plan and that if he has expressed wishes for continuation of current therapy, escalation of BiPAP if necessary, but no intubation that she  agrees this is the best plan moving forward.  Length of Stay: 6  Current Medications: Scheduled Meds:  . sodium chloride   Intravenous Once  . atorvastatin  10 mg Oral Daily  . bethanechol  10 mg Oral TID  . Chlorhexidine Gluconate Cloth  6 each Topical Daily  . citalopram  10 mg Oral Daily  . digoxin  0.125 mg Oral Daily  . feeding supplement  1 Container Oral Q24H  . feeding supplement (KATE FARMS STANDARD 1.4)  325 mL Oral Q24H  .  furosemide  80 mg Intravenous BID  . insulin aspart  0-5 Units Subcutaneous QHS  . insulin aspart  0-9 Units Subcutaneous TID WC  . ipratropium-albuterol  3 mL Nebulization TID  . mouth rinse  15 mL Mouth Rinse BID  . pantoprazole  40 mg Oral Daily  . tamsulosin  0.4 mg Oral QPC supper  . cyanocobalamin  1,000 mcg Oral Daily    Continuous Infusions:   PRN Meds: albuterol, ondansetron (ZOFRAN) IV, oxyCODONE-acetaminophen  Physical Exam         General: Alert, awake, in no acute distress. On high flow nasal cannula eating lunch at time of my exam Heart: Regular rate and rhythm. No murmur appreciated. Lungs: Decreased air movement, scattered crackles Abdomen: Soft, nontender, nondistended, positive bowel sounds.  Ext: Trace edema Skin: Warm and dry Neuro: Grossly intact, nonfocal.   Vital Signs: BP (!) 107/48 (BP Location: Right Arm)   Pulse 75   Temp 97.6 F (36.4 C) (Oral)   Resp 18   Ht 6\' 1"  (1.854 m)   Wt 71 kg   SpO2 90%   BMI 20.65 kg/m  SpO2: SpO2: 90 % O2 Device: O2 Device: High Flow Nasal Cannula, NRB O2 Flow Rate: O2 Flow Rate (L/min): 15 L/min  Intake/output summary:   Intake/Output Summary (Last 24 hours) at 12/30/2019 1230 Last data filed at 12/30/2019 0600 Gross per 24 hour  Intake --  Output 1900 ml  Net -1900 ml   LBM: Last BM Date: 12/29/19 Baseline Weight: Weight: 61.7 kg Most recent weight: Weight: 71 kg       Palliative Assessment/Data:      Patient Active Problem List   Diagnosis Date Noted  .  Protein-calorie malnutrition, severe 12/25/2019  . Gastrointestinal hemorrhage 12/24/2019  . Acute blood loss anemia   . Pressure ulcer 12/08/2019  . Depression, recurrent ([place])   . Acute respiratory failure with hypoxia ([place])   . Goals of care, counseling/discussion   . Palliative care by specialist   . Acute exacerbation of CHF (congestive heart failure) ([place]) 11/13/2019  . Elevated troponin   . Chronic atrial fibrillation ([place])   . Weakness generalized   . DNR (do not resuscitate)   . Acute on chronic combined systolic (congestive) and diastolic (congestive) heart failure ([place]) 04/27/2019  . COPD with acute exacerbation ([place]) 08/01/2018  . Pulmonary edema 07/30/2018  . Health care maintenance 05/09/2018  . Gastritis and gastroduodenitis   . Diverticulum of duodenum   . Aortic root dilatation ([place])   . 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 Atherosclerosis of aorta ([place]) 08/11/2016  . Orthostatic hypotension 09/26/2015  . Near syncope 09/24/2015  . Syncope 09/24/2015  . Hepatic cirrhosis ([place]) 09/25/2014  . History of colonic polyps 09/25/2014  . Serum total bilirubin elevated 06/30/2014  . Follow-up examination, following unspecified surgery 01/27/2014  . Lung cancer ([place]) 11/29/2013  . Prosthetic valve dysfunction   . Chronic combined systolic and diastolic CHF (congestive heart failure) ([place])   . Type 2 diabetes mellitus ([place]) 11/04/2013  . Myocardial infarction ([place]) 11/01/2013  . Cardiomyopathy, nonischemic ([place]) 11/01/2013  . Acute on chronic respiratory failure with hypoxia ([place]) 08/15/2013  . H/O intracranial hemorrhage 08/13/2013  . H/O endocarditis 08/13/2013  . H/O: CVA (cerebrovascular accident) 08/13/2013  . Community acquired pneumonia 08/13/2013  . Chronic respiratory failure assoc with chf/ PAH 08/12/2013  . H/O atrioventricular nodal ablation   . S/P MVR (mitral valve replacement)   . Hypertension associated with diabetes ([place])   .  Hyperlipidemia  associated with type 2 diabetes mellitus ([place])   . Permanent atrial fibrillation   . Ejection fraction < 50%   . ICD (implantable cardioverter-defibrillator), biventricular, in situ   . Renal artery stenosis ([place])   . Pulmonary hypertension ([place])   . Primary cancer of left upper lobe of lung ([place]) 04/20/2009  . COLONIC POLYPS, ADENOMATOUS 03/22/2007  . COPD GOLD II 01/11/2007  . GERD 01/11/2007    Palliative Care Assessment & Plan   Patient Profile: 74 y.o.malewith past medical history of combined CHF with EF 25%, status post AICD, severe COPD/emphysema, chronic respiratory failure on 5 L at skilled facility, permanent A. fib on ASA, intercranial hemorrhage, mitral regurg status post bioprosthetic valve replacement, non-small cell lung cancer status post wedge resection, DM type II, hypertension, hyperlipidemiaadmitted on 6/15/2021with abdominal pain and maroon-colored stool. Of note he recently had CT of abdomen pelvis that showed possible cecal mass. He has declined consideration for colonoscopy as he been told in the past he would be high risk for any sort of sedating procedure.  Also, he has had increasing oxygen needs and is wearing 12 L nasal cannula as well as intermittent nonrebreather which is up from his baseline of 5 L nasal cannula outside of the hospital.Palliative consulted for goals of care.  Recommendations/Plan:  DNR/DNI  He felt better from pain and shortness of breath standpoint after having oxycodone/acetaminophen 5/325.  Continue as needed for pain and can also use if needed for shortness of breath.  He and his wife expressed concern when mentioned about other opioids such as as needed morphine.  He expressed preference to continue with current medication of oxycodone/acetaminophen.  Continue current interventions.  I discussed with Mr. Vecchio today concern that he continues to have increase in his oxygen requirements.  He expressed understanding this but states that  he is not feeling poorly at this point and wants to continue with current interventions.  He is also familiar with and would want BiPAP therapy if necessary.  He indicated again that he would not want mechanical ventilation or intubation.  Palliative care team to continue to follow and progress conversation based upon his clinical course of the next few days.  I am concerned with his overall decline and if oxygenation does not begin to improve shortly with further diuresis, there is high likelihood this will be terminal event.  I discussed this with both Mr. Crookshanks and his wife.  Code Status:    Code Status Orders  (From admission, onward)         Start     Ordered   12/24/19 0843  Do not attempt resuscitation (DNR)  Continuous       Question Answer Comment  In the event of cardiac or respiratory ARREST Do not call a "code blue"   In the event of cardiac or respiratory ARREST Do not perform Intubation, CPR, defibrillation or ACLS   In the event of cardiac or respiratory ARREST Use medication by any route, position, wound care, and other measures to relive pain and suffering. May use oxygen, suction and manual treatment of airway obstruction as needed for comfort.      12/24/19 0842        Code Status History    Date Active Date Inactive Code Status Order ID Comments User Context   12/17/2019 1137 12/24/2019 0515 DNR [id number]  Gayland Curry, DO Outpatient   12/06/2019 2335 12/13/2019 0055 DNR [id number]  Lenore Cordia, MD ED  11/13/2019 2118 11/25/2019 1857 DNR [id number]  Truddie Hidden, MD ED   11/13/2019 2114 11/13/2019 2118 Full Code [id number]  Truddie Hidden, MD ED   06/07/2019 1321 06/10/2019 2108 DNR [id number]  Karmen Bongo, MD ED   04/27/2019 1937 05/02/2019 1915 DNR [id number]  Lenore Cordia, MD ED   07/30/2018 1722 08/02/2018 1814 DNR [id number]  Cristal Deer, MD Inpatient   09/24/2015 1730 09/27/2015 1751 Full Code [id number]  Kelvin Cellar, MD Inpatient   11/29/2013 1523  12/03/2013 1401 Full Code [id number]  [name], MD Inpatient   08/12/2013 1804 08/18/2013 1820 Full Code [id number]  Barrett, Evelene Croon, PA-C Inpatient   Advance Care Planning Activity    Advance Directive Documentation     Most Recent Value  Type of Advance Directive Out of facility DNR (pink MOST or yellow form)  Pre-existing out of facility DNR order (yellow form or pink MOST form) Pink MOST form placed in chart (order not valid for inpatient use)  "MOST" Form in Place? --       Prognosis:  Guarded  Discharge Planning:  To Be Determined  Care plan was discussed with daughter  Thank you for allowing the Palliative Medicine Team to assist in the care of this patient.   Time In: 1200 Time Out: 1240 Total Time 40 Prolonged Time Billed No      Greater than 50%  of this time was spent counseling and coordinating care related to the above assessment and plan.  Micheline Rough, MD  Please contact Palliative Medicine Team phone at [phone] for questions and concerns.

## 2019-12-30 NOTE — Progress Notes (Signed)
Progress Note  Patient Name: [name] Date of Encounter: 12/30/2019  [facility] Cardiologist: Candee Furbish, MD   Subjective   No chest pain, breathing about the same. Still with maroon stools he believes 3 today.  Inpatient Medications    Scheduled Meds: . sodium chloride   Intravenous Once  . atorvastatin  10 mg Oral Daily  . bethanechol  10 mg Oral TID  . Chlorhexidine Gluconate Cloth  6 each Topical Daily  . citalopram  10 mg Oral Daily  . digoxin  0.125 mg Oral Daily  . feeding supplement  1 Container Oral Q24H  . feeding supplement (K[employer] STANDARD 1.4)  325 mL Oral Q24H  . furosemide  80 mg Intravenous BID  . insulin aspart  0-5 Units Subcutaneous QHS  . insulin aspart  0-9 Units Subcutaneous TID WC  . ipratropium-albuterol  3 mL Nebulization TID  . mouth rinse  15 mL Mouth Rinse BID  . pantoprazole  40 mg Oral Daily  . tamsulosin  0.4 mg Oral QPC supper  . cyanocobalamin  1,000 mcg Oral Daily   Continuous Infusions:  PRN Meds: albuterol, ondansetron (ZOFRAN) IV, oxyCODONE-acetaminophen   Vital Signs    Vitals:   12/30/19 0818 12/30/19 1200 12/30/19 1239 12/30/19 1253  BP:   (!) 72/43 (!) 110/42  Pulse:   74 73  Resp:   (!) 21 18  Temp:  98.4 F (36.9 C)    TempSrc:  Axillary    SpO2: 90%  97% 93%  Weight:      Height:        Intake/Output Summary (Last 24 hours) at 12/30/2019 1523 Last data filed at 12/30/2019 0600 Gross per 24 hour  Intake --  Output 1900 ml  Net -1900 ml   Last 3 Weights 12/30/2019 12/29/2019 12/28/2019  Weight (lbs) 156 lb 8.4 oz 156 lb 15.5 oz 158 lb 15.2 oz  Weight (kg) 71 kg 71.2 kg 72.1 kg      Telemetry    A fib and NSVT yesterday  - Personally Reviewed  ECG    No new - Personally Reviewed  Physical Exam   GEN: No acute distress.   Neck: No JVD sitting up but did not want to lie back Cardiac: irreg irreg, no murmurs, rubs, or gallops.  Respiratory: Clear to auscultation bilaterally ant position. GI:  Soft, nontender, non-distended  MS: No edema; No deformity. SCD stockings in place Neuro:  Nonfocal  Psych: Normal affect   Labs    High Sensitivity Troponin:   Recent Labs  Lab 12/06/19 1824 12/06/19 2140  TROPONINIHS 19* 19*      Chemistry Recent Labs  Lab 12/28/19 0135 12/29/19 0211 12/30/19 0257  NA 133* 133* 135  K 4.6 4.9 4.7  CL 99 94* 95*  CO2 30 30 33*  GLUCOSE 170* 210* 148*  BUN 28* 27* 21  CREATININE 0.78 0.66 0.65  CALCIUM 8.2* 8.1* 7.9*  PROT 5.5* 5.5* 5.5*  ALBUMIN 2.7* 2.6* 2.5*  AST 11* 8* 8*  ALT 8 8 8   ALKPHOS 71 70 67  BILITOT 0.8 0.8 1.4*  GFRNONAA >60 >60 >60  GFRAA >60 >60 >60  ANIONGAP 4* 9 7     Hematology Recent Labs  Lab 12/28/19 1035 12/28/19 1035 12/28/19 1634 12/29/19 0211 12/30/19 0257  WBC 10.4  --   --  10.6* 10.2  RBC 2.86*  --   --  2.92* 3.07*  HGB 7.4*   < > 8.3* 7.7*  8.2*  HCT 25.5*   < > 27.8* 26.1* 26.8*  MCV 89.2  --   --  89.4 87.3  MCH 25.9*  --   --  26.4 26.7  MCHC 29.0*  --   --  29.5* 30.6  RDW 22.3*  --   --  21.4* 21.6*  PLT 195  --   --  207 233   < > = values in this interval not displayed.    BNP Recent Labs  Lab 12/29/19 0211  BNP 1,473.9*     DDimer No results for input(s): DDIMER in the last 168 hours.   Radiology    DG CHEST PORT 1 VIEW  Result Date: 12/30/2019 CLINICAL DATA:  Short of breath EXAM: PORTABLE CHEST 1 VIEW COMPARISON:  01/06/2020 FINDINGS: Bilateral airspace disease right greater than left. Mild improvement in airspace disease on the right. No change on the left. Small bilateral pleural effusions. Left lower lobe atelectasis unchanged. AICD unchanged. IMPRESSION: Diffuse bilateral airspace disease with mild improvement on the right probable edema. Electronically Signed   By: Franchot Gallo M.D.   On: 12/30/2019 08:22   DG CHEST PORT 1 VIEW  Result Date: 12/29/2019 CLINICAL DATA:  Shortness of breath EXAM: PORTABLE CHEST 1 VIEW COMPARISON:  December 27, 2019 FINDINGS:  Bilateral pulmonary infiltrates are stable on the left and mildly worsened in the right mid lung in the interval. Small right effusion. Stable cardiomegaly. No other changes. IMPRESSION: Mild worsening of right-sided pulmonary infiltrate. Stable left infiltrate. No other changes. Electronically Signed   By: Dorise Bullion III M.D   On: 12/29/2019 11:12    Cardiac Studies   TTE 11/14/19: 1. LVEF is severely depressed with diffuse hypokinesis, worse in the  septal and apical walls Compared to report from Nov 2020 echo, LVEF is  worse (apical views foreshortened in 2020 exam which makes assessment  difficult). Left ventricular ejection  fraction, by estimation, is 25 to 30%. The left ventricle has severely  decreased function. The left ventricle demonstrates global hypokinesis.  There is mild left ventricular hypertrophy. Left ventricular diastolic  parameters are indeterminate.  2. Right ventricular systolic function is moderately reduced. The right  ventricular size is severely enlarged. There is moderately elevated  pulmonary artery systolic pressure.  3. Left atrial size was severely dilated.  4. Right atrial size was severely dilated.  5. A 33 mm [employer] Mosai bioprosthesisiis present Peak and mean  gradients through the vave are 15 and 6 mm Hg respectively, both lower  than previous echo report in 2020.Marland Kitchen The mitral valve has been  repaired/replaced. Trivial mitral valve regurgitation.  6. The aortic valve is abnormal. Aortic valve regurgitation is not  visualized. Mild to moderate aortic valve sclerosis/calcification is  present, without any evidence of aortic stenosis.  7. Aortic root is 51 mm Previous echo measured echo above this; current  exam did not image that high.. Aortic dilatation noted. Aneurysm of the  aortic root. There is moderate to severe dilatation of the aortic root  measuring 51 mm.  8. The inferior vena cava is dilated in size with <50% respiratory    variability, suggesting right atrial pressure of 15 mmHg.    Patient Profile     74 y.o. 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    Assessment & Plan    Acute onChronic combined systolic and diastolic heart failure: EF 25 to 30%. Status post BiVICD. Started on IV lasix. On digoxin 0.125 mg daily.  Has been unable to tolerate GDMT due to hypotension. --BNP 1473 on the 20th.  - -IV lasix increased to 80 mg BID yesterday and now he is neg 3655 since admit and since yesterday neg 2050 of that with increased lasix.  Wt pk of 72 kg on the 19th and now 71 kg.  -- Cr 0.65 stable and Na up from 133 to 135.   Permanent atrial fibrillation: s/p AV nodal ablation and BiV ICD.  Not on anticoagulation given history of ICHand now with GI bleed.  S/p LAA clip.  Acute on chronic hypoxic respiratory failure: Severe COPD on baseline 5 L, currently up to 15L HFNC. Started on IV Lasix as above.  Sinus of Valsalva aneurysm: measures 73mm.  Has been seen by cardiothoracic surgery, not on operative candidate  GI bleed: Cecal mass on CT abdomen pelvis 12/06/2019.  Having ongoing bleeding. (Hgb 6.5 on admit) pt did not want colonoscopy   --hgb now 8.2  Has rec'd 3 units this admit.  --continues with maroon stools   Goals of care: agree with palliative medicine consult and ongoing goals of care discussion.     For questions or updates, please contact [place] Please consult [email or web address] for contact info under        Signed, Cecilie Kicks, NP  12/30/2019, 3:23 PM

## 2019-12-30 NOTE — Progress Notes (Signed)
PROGRESS NOTE    [name]  RCV:[id number] DOB: Jan 07, 1946 DOA: 12/24/2019 PCP: [name], NP   Brief Narrative:  HPI per Dr. Chesley Mires on 12/24/19 74 yo male presented from rehab center with abdominal pain and maroon colored stool.  PCCM asked to admit to ICU.  He had CT abd/pelvis 12/06/19 that showed possible cecal mass.  History of present illness   Denies chest pain, dyspnea.  Has nausea but no vomiting.  Feels weak.  Never had this before.  He is DNR but agreeable to other medical therapies.  Past Medical History  Combined CHF with EF 25%, s/p AICD, Severe COPD/emphysema, chronic respiratory failure on 5 liters, Permanent A fib on ASA, ICH, mitral regurg s/p bioprosthetic MVR, NSCLC s/p wedge resection, DM type 2, HTN, HLD  **Interim History He had a CT angio of the abdomen pelvis which showed no evidence of any GI bleeding identified.  He continues to have some maroon stools.  He received 2 units of PRBCs.  After discussion with GI patient and the family did now want to pursue colonoscopy and favored conservative management only.  GI signed off the case.  His O2 saturations continued to drop so he had to be bumped up on his oxygen requirement and had to be placed on a NRB last night but now on 10 Liters.  Palliative care was consulted for further evaluation recommendations. He continues to have bloody bowel movements and hemoglobin started dropping so he is transfused another unit of PRBCs.  He appeared to be volume overload and respiratory status is worsening so he was given IV Lasix 20 mg.  Have consulted cardiology for further evaluation of suspected acute on chronic combined systolic and diastolic CHF.  Palliative care consulted for goals of care discussion after today's discussion Dr. Domingo Cocking recommends to continue current interventions and follow his clinical course from the 24 to 48 hours and have further discussion based on his progress.  Cardiology evaluated the patient  today and they recommended IV Lasix 80 mg twice daily for now.  We will continue to watch his clinical progress and he continues to have GI bleeding.  Blood count has dropped slightly and his oxygen requirements are going up.  He is now on a heated high flow nasal cannula with 25 L and 60% FiO2.  Palliative continues to have goals of care discussion pain and dyspnea.  Patient is agreeable for BiPAP but does not want mechanical ventilation.  He continues to have concern for overall decline and if his oxygen does not improve with diuresis IV feels that this is likely a highly terminal event.  Cardiology recommending continue IV Lasix and pain control for air hunger..  Assessment & Plan:   Active Problems:   Pressure ulcer   Gastrointestinal hemorrhage   Acute blood loss anemia   Protein-calorie malnutrition, severe   Hemorrhagic shock (HCC)   SOB (shortness of breath)   Hypoxia  Lower GI bleeding with concern for cecal mass noted on CT abd/pelvis 12/06/19. -He has decided not to pursue endoscopic procedures, and would rather focus on conservative management -Continue  f/u CBC as he continues bloody bowel movement and he had to after his H&H was drawn this morning -F/u Hb and transfuse if < 7 or significant bleeding;' he is already transfused  2 units of PRBCs: Total of 3 total -Patient's hemoglobin/hematocrit dropped and he was transfused another unit of PRBCs; H&H after his blood transfusion now 8.3/27.8 and would like to keep  a closer to 10 given his history of heart failure; now hemoglobin/hematocrit is 8.2/26.8 -He continues to have bloody bowel movements  -Advance diet as tolerated he is now on a regular diet -Have consulted palliative care for further goals of care discussion and will continue current course -Continue to Trend CBCs   Acute on chronic combined systolic and diastolic CHF with an EF of 25 to 30% status post AICD and pacemaker CAD Permanent A fib,  WHO group 2 and 3  pulmonary HTN HLD. -Continue digoxin 0.125 mg p.o. daily, atorvastatin 10 mg p.o. daily -hold outpt ASA,  -Cardiology recommending continue IV Lasix 80 mg pending blood pressure -IV fluid has now been discontinued did receive some volume from his blood transfusion -Cardiology has been consulted for further evaluation and management and appreciate Dr. Elder Love evaluation; patient's BNP was 1473.9 -I's and O's and daily weights; he is - 4.355 L since admission; his weight has gone up to 156 from 136 on admission likely in the setting of volume -Continue to monitor on telemetry in the [facility] unit for now -No longer on anticoagulation due to a past [place] -Patient is agreeable to BiPAP if necessary but does not want mechanical ventilation -Repeat chest x-ray in a.m.  Severe COPD/emphysema with at least 5 L of nasal cannula, worsened now on heated high flow nasal cannula Acute on chronic hypoxic respiratory failure, worsening and on heated high flow nasal cannula History of non-small cell lung cancer status post VATS -C/w scheduled duoneb and with as needed albuterol nebs every 4 hours as needed wheezing - goal SpO2 88 to 95% -SpO2: (!) 89 % O2 Flow Rate (L/min): 25 L/min FiO2 (%): 35 % -His O2 saturations worsened and he has been saturating in the low 80s so we will need to increase his oxygen requirement and monitor carefully -Continue to hold outpt breztri -Have consulted palliative care for further goals of care discussion and his respiratory status is likely worsening in the setting of above and likely from heart failure -We will have cardiology assist with diuresis given that his blood pressure is on the lower side.  In the interim be given IV Lasix 20 mg and now Cardiology has increased this to IV 80 mg BID -WBC went from 10.4 -> 10.6 and today is 10.2 but he remains Afebrile  -CXR this AM showed "Diffuse bilateral airspace disease with mild improvement on the right probable  edema." -His respiratory status is worsening however continues to diurese adequately -Continue with oxycodone for air hunger provided recommendations -He is agreeable to BiPAP if necessary  DM type II poorly controlled with hyperglycemia. - SSI - hold metformin -CBGs have been ranging from 162-232  Hx of Depression. - resume Citalopram  BPH, urine retention. -C/w Bethanechol, Tamsuolsin  Deconditioning. - PT/OT further evaluate and treat and recommending SNF again   History of alcoholic cirrhosis -Has not been drinking for over a decade -Continue monitor LFTs and bilirubin and they are normal  Goals of care. - DNR; Does not want further work up for his GIB - Will consult Palliative Care for further evaluation and further GOC Discussion we will continue current course and have consulted cardiology for further assistance given his worsening respiratory status and what appears to be a volume overload on his x-ray vs having a PNA: Currently he is getting diuresed and he had more shortness of breath.  Oxycodone was started for his air hunger and cardiology continues to diurese him.  Palliative had another goals  of care and he is agreeable to BiPAP but does not want intubation.  Hyponatremia -Mild at 133 and improved to 135 -Continue to Monitor and Trend   Stage 1 Coccyx Ulcer, pOA -Continue Wound Care  DVT prophylaxis: SCDs given his GI bleeding Code Status: DO NOT RESUSCITATE  Family Communication: No family present at bedside Disposition Plan: Pending further clinical stabilization and improvement of his hypoxia and bleeding.  We have consulted palliative care for goals of care discussion and will need to ensure stability prior to D/C  Status is: Inpatient  Remains inpatient appropriate because:Unsafe d/c plan, IV treatments appropriate due to intensity of illness or inability to take PO and Inpatient level of care appropriate due to severity of illness   Dispo: The  patient is from: Home              Anticipated d/c is to: TBD              Anticipated d/c date is: 2 days              Patient currently is not medically stable to d/c.  Consultants:   Gastroenterology  PCCM Transfer  Palliative Care   Cardiology   Procedures: None   Antimicrobials:  Anti-infectives (From admission, onward)   None     Subjective: Seen and evaluated at bedside and his O2 saturations were continuing to climb and he had to have more oxygen requirements and was on 25 L of heated high flow nasal cannula with 60% FiO2.  He continued to have bloody bowel movements and had had 2 bloody bowel movements at least overnight.  No nausea or vomiting.  States that he felt short of breath today.  No other concerns or complaints at this time and is diuresing adequately.  Objective: Vitals:   12/30/19 1400 12/30/19 1550 12/30/19 1600 12/30/19 1922  BP: (!) 113/41     Pulse: (!) 47     Resp: 19     Temp:   (!) 97.5 F (36.4 C) 97.7 F (36.5 C)  TempSrc:   Oral Oral  SpO2: (!) 89% (!) 89%    Weight:      Height:        Intake/Output Summary (Last 24 hours) at 12/30/2019 1949 Last data filed at 12/30/2019 0818 Gross per 24 hour  Intake --  Output 1500 ml  Net -1500 ml   Filed Weights   12/28/19 0437 12/29/19 0500 12/30/19 0500  Weight: 72.1 kg 71.2 kg 71 kg   Examination: Physical Exam:  Constitutional: The patient is a thin elderly Caucasian male currently requiring more oxygen and in some respiratory distress today.  He does appear little uncomfortable. Eyes: Lids and conjunctivae normal, sclerae anicteric  ENMT: External Ears, Nose appear normal. Grossly normal hearing.  Neck: Appears normal, supple, no cervical masses, normal ROM, no appreciable thyromegaly; slight JVD Respiratory: Diminished to auscultation bilaterally with coarse breath sounds and some rhonchi and crackles bilaterally worse on the left slightly.  His increased respiratory effort and he is  about to be placed on heated high flow nasal cannula given his worsening saturations Cardiovascular: Irregularly irregular and slightly tachycardic, no murmurs / rubs / gallops. S1 and S2 auscultated. No extremity edema.  1+ Abdomen: Soft, non-tender, non-distended.  Bowel sounds positive.  GU: Deferred. Musculoskeletal: No clubbing / cyanosis of digits/nails. No joint deformity upper and lower extremities.  Skin: No rashes, lesions, ulcers on limited skin evaluation. No induration; Warm and dry.  Neurologic:  CN 2-12 grossly intact with no focal deficits.  Romberg sign cerebellar reflexes not assessed.  Psychiatric: Normal judgment and insight. Alert and oriented x 3.  Slightly anxious mood and appropriate affect.   Data Reviewed: I have personally reviewed following labs and imaging studies  CBC: Recent Labs  Lab 12/24/19 0525 12/24/19 1705 12/27/19 0203 12/27/19 0203 12/28/19 0135 12/28/19 1035 12/28/19 1634 12/29/19 0211 12/30/19 0257  WBC 7.9   < > 11.4*  --  10.5 10.4  --  10.6* 10.2  NEUTROABS 6.7  --  9.8*  --  8.6*  --   --  8.6* 7.9*  HGB 6.5*   < > 7.8*   < > 7.2* 7.4* 8.3* 7.7* 8.2*  HCT 22.5*   < > 26.0*   < > 24.3* 25.5* 27.8* 26.1* 26.8*  MCV 83.6   < > 87.8  --  88.4 89.2  --  89.4 87.3  PLT 177   < > 185  --  191 195  --  207 233   < > = values in this interval not displayed.   Basic Metabolic Panel: Recent Labs  Lab 12/26/19 0756 12/27/19 0203 12/28/19 0135 12/29/19 0211 12/30/19 0257  NA 134* 133* 133* 133* 135  K 4.3 4.5 4.6 4.9 4.7  CL 99 98 99 94* 95*  CO2 29 28 30 30  33*  GLUCOSE 166* 191* 170* 210* 148*  BUN 20 24* 28* 27* 21  CREATININE 0.64 0.88 0.78 0.66 0.65  CALCIUM 8.4* 8.5* 8.2* 8.1* 7.9*  MG 2.1 2.2 2.1 2.1 1.9  PHOS 3.4 4.4 3.9 3.0 3.1   GFR: Estimated Creatinine Clearance: 83.8 mL/min (by C-G formula based on SCr of 0.65 mg/dL). Liver Function Tests: Recent Labs  Lab 12/26/19 0756 12/27/19 0203 12/28/19 0135 12/29/19 0211  12/30/19 0257  AST 10* 9* 11* 8* 8*  ALT 9 7 8 8 8   ALKPHOS 66 71 71 70 67  BILITOT 1.1 0.9 0.8 0.8 1.4*  PROT 5.4* 5.7* 5.5* 5.5* 5.5*  ALBUMIN 2.9* 2.9* 2.7* 2.6* 2.5*   Recent Labs  Lab 12/24/19 0525  LIPASE 25   No results for input(s): AMMONIA in the last 168 hours. Coagulation Profile: Recent Labs  Lab 12/24/19 0525  INR 1.2   Cardiac Enzymes: No results for input(s): CKTOTAL, CKMB, CKMBINDEX, TROPONINI in the last 168 hours. BNP (last 3 results) No results for input(s): PROBNP in the last 8760 hours. HbA1C: No results for input(s): HGBA1C in the last 72 hours. CBG: Recent Labs  Lab 12/29/19 1653 12/29/19 2107 12/30/19 0808 12/30/19 1210 12/30/19 1657  GLUCAP 165* 216* 162* 278* 232*   Lipid Profile: No results for input(s): CHOL, HDL, LDLCALC, TRIG, CHOLHDL, LDLDIRECT in the last 72 hours. Thyroid Function Tests: No results for input(s): TSH, T4TOTAL, FREET4, T3FREE, THYROIDAB in the last 72 hours. Anemia Panel: No results for input(s): VITAMINB12, FOLATE, FERRITIN, TIBC, IRON, RETICCTPCT in the last 72 hours. Sepsis Labs: Recent Labs  Lab 12/24/19 0547  LATICACIDVEN 2.6*    Recent Results (from the past 240 hour(s))  SARS Coronavirus 2 by RT PCR (hospital order, performed in [facility] hospital lab) Nasopharyngeal Nasopharyngeal Swab     Status: None   Collection Time: 12/24/19  7:29 AM   Specimen: Nasopharyngeal Swab  Result Value Ref Range Status   SARS Coronavirus 2 NEGATIVE NEGATIVE Final    Comment: (NOTE) SARS-CoV-2 target nucleic acids are NOT DETECTED.  The SARS-CoV-2 RNA is generally detectable in upper and lower  respiratory specimens during the acute phase of infection. The lowest concentration of SARS-CoV-2 viral copies this assay can detect is 250 copies / mL. A negative result does not preclude SARS-CoV-2 infection and should not be used as the sole basis for treatment or other patient management decisions.  A negative result may  occur with improper specimen collection / handling, submission of specimen other than nasopharyngeal swab, presence of viral mutation(s) within the areas targeted by this assay, and inadequate number of viral copies (<250 copies / mL). A negative result must be combined with clinical observations, patient history, and epidemiological information.  Fact Sheet for Patients:   StrictlyIdeas.no  Fact Sheet for Healthcare Providers: BankingDealers.co.za  This test is not yet approved or  cleared by the [place] FDA and has been authorized for detection and/or diagnosis of SARS-CoV-2 by FDA under an Emergency Use Authorization (EUA).  This EUA will remain in effect (meaning this test can be used) for the duration of the COVID-19 declaration under Section 564(b)(1) of the Act, 21 U.S.C. section 360bbb-3(b)(1), unless the authorization is terminated or revoked sooner.  Performed at [facility], [place] [address] [place], [place] [address]   MRSA PCR Screening     Status: None   Collection Time: 12/24/19 11:05 AM   Specimen: Nasal Mucosa; Nasopharyngeal  Result Value Ref Range Status   MRSA by PCR NEGATIVE NEGATIVE Final    Comment:        The GeneXpert MRSA Assay (FDA approved for NASAL specimens only), is one component of a comprehensive MRSA colonization surveillance program. It is not intended to diagnose MRSA infection nor to guide or monitor treatment for MRSA infections. Performed at [facility], [place] [address], [place], [place] [address]      RN Pressure Injury Documentation: Pressure Injury 12/08/19 Coccyx Mid;Right;Left Stage 1 -  Intact skin with non-blanchable redness of a localized area usually over a bony prominence. redness or discoloration on coccyx (Active)  12/08/19 0700  Location: Coccyx  Location Orientation: Mid;Right;Left  Staging: Stage 1 -  Intact skin with  non-blanchable redness of a localized area usually over a bony prominence.  Wound Description (Comments): redness or discoloration on coccyx  Present on Admission: Yes     Pressure Injury 12/24/19 Coccyx Left;Right Stage 1 -  Intact skin with non-blanchable redness of a localized area usually over a bony prominence. (Active)  12/24/19   Location: Coccyx  Location Orientation: Left;Right  Staging: Stage 1 -  Intact skin with non-blanchable redness of a localized area usually over a bony prominence.  Wound Description (Comments):   Present on Admission: Yes     Pressure Injury 12/28/19 Ear Left Stage 2 -  Partial thickness loss of dermis presenting as a shallow open injury with a red, pink wound bed without slough. back of L ear lobe, redness with broken skin (Active)  12/28/19 0000  Location: Ear  Location Orientation: Left  Staging: Stage 2 -  Partial thickness loss of dermis presenting as a shallow open injury with a red, pink wound bed without slough.  Wound Description (Comments): back of L ear lobe, redness with broken skin  Present on Admission: No     Estimated body mass index is 20.65 kg/m as calculated from the following:   Height as of this encounter: 6\' 1"  (1.854 m).   Weight as of this encounter: 71 kg.  Malnutrition Type:  Nutrition Problem: Severe Malnutrition Etiology: chronic illness (COPD and lung cancer)  Malnutrition Characteristics:  Signs/Symptoms: moderate fat depletion, moderate muscle depletion, severe fat depletion, severe muscle depletion   Nutrition Interventions:  Interventions: Boost Breeze, Magic cup, MVI, Other (Comment) Dillard [place])   Radiology Studies: DG CHEST PORT 1 VIEW  Result Date: 12/30/2019 CLINICAL DATA:  Short of breath EXAM: PORTABLE CHEST 1 VIEW COMPARISON:  01/06/2020 FINDINGS: Bilateral airspace disease right greater than left. Mild improvement in airspace disease on the right. No change on the left. Small bilateral pleural  effusions. Left lower lobe atelectasis unchanged. AICD unchanged. IMPRESSION: Diffuse bilateral airspace disease with mild improvement on the right probable edema. Electronically Signed   By: Franchot Gallo M.D.   On: 12/30/2019 08:22   DG CHEST PORT 1 VIEW  Result Date: 12/29/2019 CLINICAL DATA:  Shortness of breath EXAM: PORTABLE CHEST 1 VIEW COMPARISON:  December 27, 2019 FINDINGS: Bilateral pulmonary infiltrates are stable on the left and mildly worsened in the right mid lung in the interval. Small right effusion. Stable cardiomegaly. No other changes. IMPRESSION: Mild worsening of right-sided pulmonary infiltrate. Stable left infiltrate. No other changes. Electronically Signed   By: Dorise Bullion III M.D   On: 12/29/2019 11:12    Scheduled Meds:  sodium chloride   Intravenous Once   atorvastatin  10 mg Oral Daily   bethanechol  10 mg Oral TID   Chlorhexidine Gluconate Cloth  6 each Topical Daily   citalopram  10 mg Oral Daily   digoxin  0.125 mg Oral Daily   feeding supplement  1 Container Oral Q24H   feeding supplement (KATE FARMS STANDARD 1.4)  325 mL Oral Q24H   furosemide  80 mg Intravenous BID   insulin aspart  0-5 Units Subcutaneous QHS   insulin aspart  0-9 Units Subcutaneous TID WC   ipratropium-albuterol  3 mL Nebulization TID   mouth rinse  15 mL Mouth Rinse BID   pantoprazole  40 mg Oral Daily   tamsulosin  0.4 mg Oral QPC supper   cyanocobalamin  1,000 mcg Oral Daily   Continuous Infusions:   LOS: 6 days   Kerney Elbe, DO [facility] PAGER is on AMION  If 7PM-7AM, please contact night-coverage [email or web address]

## 2019-12-30 NOTE — TOC Progression Note (Signed)
Transition of Care [facility]) - Progression Note    Patient Details  Name: [name] MRN: [id number] Date of Birth: 10-07-1945  Transition of Care [facility]) CM/SW Contact  Leeroy Cha, RN Phone Number: 12/30/2019, 9:57 AM  Clinical Narrative:    Continues to have bloody stools, has refused endo, palliative care seeing for goals of care and post hospital care.  Made a DNR. Will follow for toc needs. Expected Discharge Plan: [place] Barriers to Discharge: Continued Medical Work up  Expected Discharge Plan and Services Expected Discharge Plan: [place]   Discharge Planning Services: CM Consult Post Acute Care Choice: [place] Living arrangements for the past 2 months: [place]                                       Social Determinants of Health (SDOH) Interventions    Readmission Risk Interventions Readmission Risk Prevention Plan 11/25/2019 11/19/2019  Transportation Screening Complete Complete  PCP or Specialist Appt within 3-5 Days Complete Complete  HRI or [place] Complete Complete  Social Work Consult for [place] Planning/Counseling Complete Complete  Palliative Care Screening Complete Complete  Medication Review [employer] photographer) Complete Complete  Some recent data might be hidden

## 2019-12-31 ENCOUNTER — Inpatient Hospital Stay (HOSPITAL_COMMUNITY): Payer: Medicare Other

## 2019-12-31 ENCOUNTER — Encounter (HOSPITAL_COMMUNITY): Payer: Self-pay | Admitting: Pulmonary Disease

## 2019-12-31 DIAGNOSIS — R52 Pain, unspecified: Secondary | ICD-10-CM

## 2019-12-31 DIAGNOSIS — R0602 Shortness of breath: Secondary | ICD-10-CM

## 2019-12-31 DIAGNOSIS — R531 Weakness: Secondary | ICD-10-CM

## 2019-12-31 LAB — COMPREHENSIVE METABOLIC PANEL
ALT: 7 U/L (ref 0–44)
AST: 9 U/L — ABNORMAL LOW (ref 15–41)
Albumin: 2.5 g/dL — ABNORMAL LOW (ref 3.5–5.0)
Alkaline Phosphatase: 61 U/L (ref 38–126)
Anion gap: 11 (ref 5–15)
BUN: 23 mg/dL (ref 8–23)
CO2: 31 mmol/L (ref 22–32)
Calcium: 7.8 mg/dL — ABNORMAL LOW (ref 8.9–10.3)
Chloride: 93 mmol/L — ABNORMAL LOW (ref 98–111)
Creatinine, Ser: 0.69 mg/dL (ref 0.61–1.24)
GFR calc Af Amer: >60 mL/min (ref >60)
GFR calc non Af Amer: >60 mL/min (ref >60)
Glucose, Bld: 126 mg/dL — ABNORMAL HIGH (ref 70–99)
Potassium: 3.9 mmol/L (ref 3.5–5.1)
Sodium: 135 mmol/L (ref 135–145)
Total Bilirubin: 1.5 mg/dL — ABNORMAL HIGH (ref 0.3–1.2)
Total Protein: 5.5 g/dL — ABNORMAL LOW (ref 6.5–8.1)

## 2019-12-31 LAB — CBC WITH DIFFERENTIAL/PLATELET
Abs Immature Granulocytes: 0.04 10*3/uL (ref 0.00–0.07)
Basophils Absolute: 0.1 10*3/uL (ref 0.0–0.1)
Basophils Relative: 1 %
Eosinophils Absolute: 0.2 10*3/uL (ref 0.0–0.5)
Eosinophils Relative: 2 %
HCT: 26 % — ABNORMAL LOW (ref 39.0–52.0)
Hemoglobin: 8 g/dL — ABNORMAL LOW (ref 13.0–17.0)
Immature Granulocytes: 0 %
Lymphocytes Relative: 5 %
Lymphs Abs: 0.5 10*3/uL — ABNORMAL LOW (ref 0.7–4.0)
MCH: 26.5 pg (ref 26.0–34.0)
MCHC: 30.8 g/dL (ref 30.0–36.0)
MCV: 86.1 fL (ref 80.0–100.0)
Monocytes Absolute: 1.2 10*3/uL — ABNORMAL HIGH (ref 0.1–1.0)
Monocytes Relative: 12 %
Neutro Abs: 8 10*3/uL — ABNORMAL HIGH (ref 1.7–7.7)
Neutrophils Relative %: 80 %
Platelets: 242 10*3/uL (ref 150–400)
RBC: 3.02 MIL/uL — ABNORMAL LOW (ref 4.22–5.81)
RDW: 21.5 % — ABNORMAL HIGH (ref 11.5–15.5)
WBC: 10.1 10*3/uL (ref 4.0–10.5)
nRBC: 0 % (ref 0.0–0.2)

## 2019-12-31 LAB — GLUCOSE, CAPILLARY
Glucose-Capillary: 135 mg/dL — ABNORMAL HIGH (ref 70–99)
Glucose-Capillary: 179 mg/dL — ABNORMAL HIGH (ref 70–99)
Glucose-Capillary: 158 mg/dL — ABNORMAL HIGH (ref 70–99)
Glucose-Capillary: 216 mg/dL — ABNORMAL HIGH (ref 70–99)

## 2019-12-31 LAB — PHOSPHORUS: Phosphorus: 3.7 mg/dL (ref 2.5–4.6)

## 2019-12-31 LAB — MAGNESIUM: Magnesium: 1.9 mg/dL (ref 1.7–2.4)

## 2019-12-31 MED ORDER — BOOST / RESOURCE BREEZE PO LIQD CUSTOM
1.0000 | Freq: Two times a day (BID) | ORAL | Status: DC
  Administered 2019-12-31 – 2020-01-12 (×24): 1 via ORAL

## 2019-12-31 MED ORDER — SODIUM CHLORIDE (PF) 0.9 % IJ SOLN
INTRAMUSCULAR | Status: AC
  Filled 2019-12-31: qty 50

## 2019-12-31 MED ORDER — IOHEXOL 300 MG/ML  SOLN
75.0000 mL | Freq: Once | INTRAMUSCULAR | Status: AC | PRN
  Administered 2019-12-31: 75 mL via INTRAVENOUS

## 2019-12-31 NOTE — Progress Notes (Signed)
Daily Progress Note   Patient Name: [name]       Date: 12/31/2019 DOB: 11-22-1945  Age: 74 y.o. MRN#: [id number] Attending Physician: Kerney Elbe, DO Primary Care Physician: [name], NP Admit Date: 12/24/2019  Reason for Consultation/Follow-up: Establishing goals of care  Subjective: Awake alert, states that his day is going well, no significantly feeling shortness of breath today however, O2 needs have increased.      Length of Stay: 7  Current Medications: Scheduled Meds:  . sodium chloride   Intravenous Once  . atorvastatin  10 mg Oral Daily  . bethanechol  10 mg Oral TID  . Chlorhexidine Gluconate Cloth  6 each Topical Daily  . citalopram  10 mg Oral Daily  . digoxin  0.125 mg Oral Daily  . feeding supplement  1 Container Oral BID BM  . furosemide  80 mg Intravenous BID  . insulin aspart  0-5 Units Subcutaneous QHS  . insulin aspart  0-9 Units Subcutaneous TID WC  . ipratropium-albuterol  3 mL Nebulization TID  . mouth rinse  15 mL Mouth Rinse BID  . pantoprazole  40 mg Oral Daily  . tamsulosin  0.4 mg Oral QPC supper  . cyanocobalamin  1,000 mcg Oral Daily    Continuous Infusions:   PRN Meds: albuterol, ondansetron (ZOFRAN) IV, oxyCODONE-acetaminophen  Physical Exam         General: Alert, awake, in no acute distress. On high flow nasal cannula   Heart: Regular rate and rhythm. No murmur appreciated. Lungs: Decreased air movement, scattered crackles Abdomen: Soft, nontender, nondistended, positive bowel sounds.  Ext: Trace edema Skin: Warm and dry Neuro: Grossly intact, nonfocal.   Vital Signs: BP (!) 104/40   Pulse 74   Temp 97.6 F (36.4 C) (Oral)   Resp 15   Ht 6\' 1"  (1.854 m)   Wt 71 kg   SpO2 90%   BMI 20.65 kg/m  SpO2:  SpO2: 90 % O2 Device: O2 Device: High Flow Nasal Cannula O2 Flow Rate: O2 Flow Rate (L/min): 25 L/min  Intake/output summary:   Intake/Output Summary (Last 24 hours) at 12/31/2019 1443 Last data filed at 12/31/2019 1133 Gross per 24 hour  Intake 480 ml  Output 1875 ml  Net -1395 ml   LBM: Last BM Date: 12/31/19 Baseline Weight:  Weight: 61.7 kg Most recent weight: Weight: 71 kg       Palliative Assessment/Data:      Patient Active Problem List   Diagnosis Date Noted  . Hemorrhagic shock ([place])   . SOB (shortness of breath)   . Hypoxia   . Protein-calorie malnutrition, severe 12/25/2019  . Gastrointestinal hemorrhage 12/24/2019  . Acute blood loss anemia   . Pressure ulcer 12/08/2019  . Depression, recurrent ([place])   . Acute respiratory failure with hypoxia ([place])   . Goals of care, counseling/discussion   . Palliative care by specialist   . Acute exacerbation of CHF (congestive heart failure) ([place]) 11/13/2019  . Elevated troponin   . Chronic atrial fibrillation ([place])   . Weakness generalized   . DNR (do not resuscitate)   . Acute on chronic combined systolic (congestive) and diastolic (congestive) heart failure ([place]) 04/27/2019  . COPD with acute exacerbation ([place]) 08/01/2018  . Pulmonary edema 07/30/2018  . Health care maintenance 05/09/2018  . Gastritis and gastroduodenitis   . Diverticulum of duodenum   . Aortic root dilatation ([place])   . 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 Atherosclerosis of aorta ([place]) 08/11/2016  . Orthostatic hypotension 09/26/2015  . Near syncope 09/24/2015  . Syncope 09/24/2015  . Hepatic cirrhosis ([place]) 09/25/2014  . History of colonic polyps 09/25/2014  . Serum total bilirubin elevated 06/30/2014  . Follow-up examination, following unspecified surgery 01/27/2014  . Lung cancer ([place]) 11/29/2013  . Prosthetic valve dysfunction   . Chronic combined systolic and diastolic CHF (congestive heart failure) ([place])   . Type 2 diabetes  mellitus ([place]) 11/04/2013  . Myocardial infarction ([place]) 11/01/2013  . Cardiomyopathy, nonischemic ([place]) 11/01/2013  . Acute on chronic respiratory failure with hypoxia ([place]) 08/15/2013  . H/O intracranial hemorrhage 08/13/2013  . H/O endocarditis 08/13/2013  . H/O: CVA (cerebrovascular accident) 08/13/2013  . Community acquired pneumonia 08/13/2013  . Chronic respiratory failure assoc with chf/ PAH 08/12/2013  . H/O atrioventricular nodal ablation   . S/P MVR (mitral valve replacement)   . Hypertension associated with diabetes ([place])   . Hyperlipidemia associated with type 2 diabetes mellitus ([place])   . Permanent atrial fibrillation   . Ejection fraction < 50%   . ICD (implantable cardioverter-defibrillator), biventricular, in situ   . Renal artery stenosis ([place])   . Pulmonary hypertension ([place])   . Primary cancer of left upper lobe of lung ([place]) 04/20/2009  . COLONIC POLYPS, ADENOMATOUS 03/22/2007  . COPD GOLD II 01/11/2007  . GERD 01/11/2007    Palliative Care Assessment & Plan   Patient Profile: 74 y.o.malewith past medical history of combined CHF with EF 25%, status post AICD, severe COPD/emphysema, chronic respiratory failure on 5 L at skilled facility, permanent A. fib on ASA, intercranial hemorrhage, mitral regurg status post bioprosthetic valve replacement, non-small cell lung cancer status post wedge resection, DM type II, hypertension, hyperlipidemiaadmitted on 6/15/2021with abdominal pain and maroon-colored stool. Of note he recently had CT of abdomen pelvis that showed possible cecal mass. He has declined consideration for colonoscopy as he been told in the past he would be high risk for any sort of sedating procedure.  Also, he has had increasing oxygen needs and is wearing 12 L nasal cannula as well as intermittent nonrebreather which is up from his baseline of 5 L nasal cannula outside of the hospital.Palliative consulted for goals of  care.  Recommendations/Plan:  DNR/DNI  Continue as needed oxycodone/acetaminophen 5/325.    for pain and can also  use if needed for shortness of breath.   Continue current interventions.  Palliative care team to continue to follow and progress conversation based upon his clinical course of the next few days.  If there is ongoing decline and if oxygenation does not begin to improve shortly with further diuresis, there is high likelihood this will be terminal event.  Patient is aware of the serious nature of his condition.  Code Status:    Code Status Orders  (From admission, onward)         Start     Ordered   12/24/19 0843  Do not attempt resuscitation (DNR)  Continuous       Question Answer Comment  In the event of cardiac or respiratory ARREST Do not call a "code blue"   In the event of cardiac or respiratory ARREST Do not perform Intubation, CPR, defibrillation or ACLS   In the event of cardiac or respiratory ARREST Use medication by any route, position, wound care, and other measures to relive pain and suffering. May use oxygen, suction and manual treatment of airway obstruction as needed for comfort.      12/24/19 0842        Code Status History    Date Active Date Inactive Code Status Order ID Comments User Context   12/17/2019 1137 12/24/2019 0515 DNR [id number]  Gayland Curry, DO Outpatient   12/06/2019 2335 12/13/2019 0055 DNR [id number]  Lenore Cordia, MD ED   11/13/2019 2118 11/25/2019 1857 DNR [id number]  Truddie Hidden, MD ED   11/13/2019 2114 11/13/2019 2118 Full Code [id number]  Truddie Hidden, MD ED   06/07/2019 1321 06/10/2019 2108 DNR [id number]  Karmen Bongo, MD ED   04/27/2019 1937 05/02/2019 1915 DNR [id number]  Lenore Cordia, MD ED   07/30/2018 1722 08/02/2018 1814 DNR [id number]  Cristal Deer, MD Inpatient   09/24/2015 1730 09/27/2015 1751 Full Code [id number]  Kelvin Cellar, MD Inpatient   11/29/2013 1523 12/03/2013 1401 Full Code [id number]  [name], MD  Inpatient   08/12/2013 1804 08/18/2013 1820 Full Code [id number]  Barrett, Evelene Croon, PA-C Inpatient   Advance Care Planning Activity    Advance Directive Documentation     Most Recent Value  Type of Advance Directive Out of facility DNR (pink MOST or yellow form)  Pre-existing out of facility DNR order (yellow form or pink MOST form) Pink MOST form placed in chart (order not valid for inpatient use)  "MOST" Form in Place? --       Prognosis:  Guarded  Discharge Planning:  To Be Determined  Care plan was discussed with patient.   Thank you for allowing the Palliative Medicine Team to assist in the care of this patient.   Time In: 1300 Time Out: 1325 Total Time 25 Prolonged Time Billed No      Greater than 50%  of this time was spent counseling and coordinating care related to the above assessment and plan.  Loistine Chance, MD  Please contact Palliative Medicine Team phone at [phone] for questions and concerns.

## 2019-12-31 NOTE — Progress Notes (Signed)
OT Cancellation Note  Patient Details Name: [name] MRN: [id number] DOB: 1946/01/19   Cancelled Treatment:    Reason Eval/Treat Not Completed: Other (comment)  Medical issues which prohibited therapy. Per RN, increased O2 requiring HHFNC. Check back another day.   Kari Baars, OT Acute Rehabilitation Services [phone] Office- [phone], Edwena Felty D 12/31/2019, 1:01 PM

## 2019-12-31 NOTE — Progress Notes (Signed)
Nutrition Follow-up  RD working remotely.  DOCUMENTATION CODES:   Severe malnutrition in context of chronic illness, Underweight  INTERVENTION:  - will d/c [employer] [place]. - will increase Boost Breeze from once/day to BID. - continue Magic Cup once/day.  NUTRITION DIAGNOSIS:   Severe Malnutrition related to chronic illness (COPD and lung cancer) as evidenced by moderate fat depletion, moderate muscle depletion, severe fat depletion, severe muscle depletion. -ongoing  GOAL:   Patient will meet greater than or equal to 90% of their needs -minimally met on average  MONITOR:   PO intake, Supplement acceptance, Labs, Weight trends  ASSESSMENT:   74 yo male with medical history of HTN, endocarditis, dyslipidemia, septal defect, pulmonary HTN, CVA  without residual deficits, afib, s/p mitral valve replacement, CHF, COPD, MI, DM, and lung cancer (NSCLC). He presented from rehab center with abdominal pain and maroon colored stool. He was admitted to ICU. CT abdomen/pelvis done on 5/28 showed possible cecal mass.  Per flow sheet documentation, patient ate 100% of dinner on 6/19; no other meal completions documented. He has been accepting [employer] <25% of the time offered. RNs have educated several times about the importance of supplement but he has continued to refuse. He has been accepting Boost Breeze 100% of the time offered.   Weight stable 6/15-6/18 and then significantly up 6/18-6/19 and has remained stable since 6/19. Flow sheet documentation indicates mild BLE edema.   Patient is being followed by Palliative Care who last spoke with him and his wife yesterday. Guarded prognosis. DNR/DNI but wishes to continue current therapies with escalation to BiPAP, if needed.     Labs reviewed; CBG: 135 mg/dl, Cl: 93 mmol/l, Ca: 7.8 mg/dl. Medications reviewed; 80 mg IV lasix BID, sliding scale novolog, 1000 mcg oral cyanocobalamin/day.     Diet Order:   Diet Order            Diet regular  Room service appropriate? Yes; Fluid consistency: Thin  Diet effective now                 EDUCATION NEEDS:   No education needs have been identified at this time  Skin:  Skin Assessment: Skin Integrity Issues: Skin Integrity Issues:: Stage I, Stage II Stage I: L + R coccyx Stage II: L ear (newly documented 6/19)  Last BM:  6/22  Height:   Ht Readings from Last 1 Encounters:  12/24/19 6' 1"  (1.854 m)    Weight:   Wt Readings from Last 1 Encounters:  12/31/19 71 kg    Estimated Nutritional Needs:  Kcal:  1850-2040 kcal Protein:  90-105 grams Fluid:  >/= 2 L/day     Jarome Matin, MS, RD, LDN, CNSC Inpatient Clinical Dietitian RD pager # available in [facility]  After hours/weekend pager # available in [facility]

## 2019-12-31 NOTE — Progress Notes (Signed)
PT Cancellation Note  Patient Details Name: [name] MRN: [id number] DOB: 26-Jun-1946   Cancelled Treatment:    Reason Eval/Treat Not Completed: Medical issues which prohibited therapy. Per RN, increased O2 requiring HHFNC. Check back another day.    Claretha Cooper 12/31/2019, 11:40 AM [place] Pager [phone] Office [phone]

## 2019-12-31 NOTE — Progress Notes (Signed)
PROGRESS NOTE    [name]  FHL:[id number] DOB: 17-Apr-1946 DOA: 12/24/2019 PCP: [name], NP   Brief Narrative:  HPI per Dr. Chesley Mires on 12/24/19 74 yo male presented from rehab center with abdominal pain and maroon colored stool.  PCCM asked to admit to ICU.  He had CT abd/pelvis 12/06/19 that showed possible cecal mass.  History of present illness   Denies chest pain, dyspnea.  Has nausea but no vomiting.  Feels weak.  Never had this before.  He is DNR but agreeable to other medical therapies.  Past Medical History  Combined CHF with EF 25%, s/p AICD, Severe COPD/emphysema, chronic respiratory failure on 5 liters, Permanent A fib on ASA, ICH, mitral regurg s/p bioprosthetic MVR, NSCLC s/p wedge resection, DM type 2, HTN, HLD  **Interim History He had a CT angio of the abdomen pelvis which showed no evidence of any GI bleeding identified.  He continues to have some maroon stools.  He received 2 units of PRBCs.  After discussion with GI patient and the family did now want to pursue colonoscopy and favored conservative management only.  GI signed off the case.  His O2 saturations continued to drop so he had to be bumped up on his oxygen requirement and had to be placed on a NRB last night but now on 10 Liters.  Palliative care was consulted for further evaluation recommendations. He continues to have bloody bowel movements and hemoglobin started dropping so he is transfused another unit of PRBCs.  He appeared to be volume overload and respiratory status is worsening so he was given IV Lasix 20 mg.  Have consulted cardiology for further evaluation of suspected acute on chronic combined systolic and diastolic CHF.  Palliative care consulted for goals of care discussion after today's discussion Dr. Domingo Cocking recommends to continue current interventions and follow his clinical course from the 24 to 48 hours and have further discussion based on his progress.  Cardiology evaluated the patient  today and they recommended IV Lasix 80 mg twice daily for now.  We will continue to watch his clinical progress and he continues to have GI bleeding.  Blood count has dropped slightly and his oxygen requirements are going up.  He is now on a heated high flow nasal cannula with 25 L and 60% FiO2.  Palliative continues to have goals of care discussion pain and dyspnea.  Patient is agreeable for BiPAP but does not want mechanical ventilation.  He continues to have concern for overall decline and if his oxygen does not improve with diuresis IV feels that this is likely a highly terminal event.  Cardiology recommending continue IV Lasix and pain control for air hunger.  12/31/19: Patient continues to require significant monitor oxygen he is on 25 L with 80% FiO2 now on a heated high flow nasal cannula.  Cardiology still diuresing with IV Lasix.  Nutritionist came by.  PT OT still not able to see the patient given his high oxygen requirements.  Palliative care is following and recommending continuing current interventions and continue to follow progress conversation based on his clinical course over the next few days.  If he is declining and if his oxygenation does not begin to improve they feel this is likely be a terminal event.  Patient is agreeable to BiPAP still. Has not had bloody bowel movements today.    Assessment & Plan:   Active Problems:   Pressure ulcer   Gastrointestinal hemorrhage   Acute blood loss  anemia   Protein-calorie malnutrition, severe   Hemorrhagic shock (HCC)   SOB (shortness of breath)   Hypoxia   Generalized pain   General weakness   Shortness of breath  Lower GI bleeding with concern for cecal mass noted on CT abd/pelvis 12/06/19. -He has decided not to pursue endoscopic procedures, and would rather focus on conservative management -Continue  f/u CBC as he continues bloody bowel movement and he had to after his H&H was drawn this morning -F/u Hb and transfuse if < 7 or  significant bleeding;' status post transfusion of 3 units of PRBCs. -Patient is hemoglobin/hematocrit is relatively stable compared to yesterday did drop a little bit and is 8.0/26.0 -He continued to have bloody bowel movements had none today -Advance diet as tolerated he is now on a regular diet -Have consulted palliative care for further goals of care discussion and will continue current course -Continue to Trend CBCs   Acute on chronic combined systolic and diastolic CHF with an EF of 25 to 30% status post AICD and pacemaker CAD Permanent A fib,  WHO group 2 and 3 pulmonary HTN HLD. -Continue Digoxin 0.125 mg p.o. daily, atorvastatin 10 mg p.o. daily -hold outpt ASA given recent bleeding ,  -Cardiology recommending continue IV Lasix 80 mg BID pending blood pressure -IV fluid has now been discontinued did receive some volume from his blood transfusion which led to volume overload -Cardiology has been consulted for further evaluation and management and appreciate their evaluation; patient's BNP was 1473.9 -I's and O's and daily weights; he is -5.750 L since admission; his weight has gone up to 156 (Has been like this the last 3 checks) from 136 on admission likely in the setting of volume overload  -Continue to monitor on telemetry in the [facility] unit for now -No longer on anticoagulation due to a past [place] -Patient is agreeable to BiPAP if necessary but does not want mechanical ventilation -CXR this AM showed "Unchanged widespread bilateral airspace disease and small bilateral pleural effusions." -Repeat chest x-ray in a.m.  Severe COPD/emphysema with at least 5 L of nasal cannula, worsened now on heated high flow nasal cannula Acute on chronic hypoxic respiratory failure, worsening and on heated high flow nasal cannula History of non-small cell lung cancer status post VATS -C/w scheduled duoneb and with as needed albuterol nebs every 4 hours as needed wheezing - goal SpO2 88 to  95% -SpO2: 94 % O2 Flow Rate (L/min): 25 L/min FiO2 (%): 60 % -His O2 saturations worsened and he has been saturating in the low 80s so we will need to increase his oxygen requirement and monitor carefully -Continue to hold outpt breztri -Have consulted palliative care for further goals of care discussion and his respiratory status is likely worsening in the setting of above and likely from heart failure -We will have cardiology assist with diuresis given that his blood pressure is on the lower side.  In the interim be given IV Lasix 20 mg and now Cardiology has increased this to IV 80 mg BID -WBC went from 10.4 -> 10.6 and today is 10.1but he remains Afebrile  -CXR this AM showed "Unchanged widespread bilateral airspace disease and small bilateral pleural effusions." -His respiratory status is worsening however continues to diurese adequately -Continue with oxycodone for air hunger provided recommendations -He is agreeable to BiPAP if necessary -May consider Abx but he is afebrile and has no WBC -Consider obtaining a CT of the Chest in the AM   DM  type II poorly controlled with hyperglycemia. - SSI - hold metformin -CBGs have been ranging from 94-179  Hx of Depression. -Resume Citalopram 10 mg po Daily   BPH, urine retention. -C/w Bethanechol 10 mg po TID, Tamsuolsin 0.4 po Daily  Deconditioning. - PT/OT further evaluate and treat and recommending SNF again; They were unable to assess him today given his high O2 requirement  History of alcoholic cirrhosis -Has not been drinking for over a decade -Continue monitor LFTs and bilirubin; -T Bili was slightly elevated at 1.5  Goals of care. - DNR; Does not want further work up for his GIB - Will consult Palliative Care for further evaluation and further GOC Discussion we will continue current course and have consulted cardiology for further assistance given his worsening respiratory status and what appears to be a volume  overload on his x-ray vs having a PNA: Currently he is getting diuresed and he had more shortness of breath.  Oxycodone was started for his air hunger and cardiology continues to diurese him.  Palliative had another goals of care and he is agreeable to BiPAP but does not want intubation.  Hyponatremia -Mild at 133 and improved to 135 -Continue to Monitor and Trend   Stage 1 Coccyx Ulcer, pOA -Continue Wound Care  DVT prophylaxis: SCDs given his GI bleeding Code Status: DO NOT RESUSCITATE  Family Communication: No family present at bedside Disposition Plan: Pending further clinical stabilization and improvement of his hypoxia and bleeding.  We have consulted palliative care for goals of care discussion and will need to ensure stability prior to D/C given his extremely high O2 Requirement   Status is: Inpatient  Remains inpatient appropriate because:Unsafe d/c plan, IV treatments appropriate due to intensity of illness or inability to take PO and Inpatient level of care appropriate due to severity of illness   Dispo: The patient is from: Home              Anticipated d/c is to: TBD              Anticipated d/c date is: 2 days              Patient currently is not medically stable to d/c.  Consultants:   Gastroenterology  PCCM Transfer  Palliative Care   Cardiology   Procedures: None   Antimicrobials:  Anti-infectives (From admission, onward)   None     Subjective: Seen and evaluated at bedside he tells me that he is feeling a little short of breath today.  His O2 saturations are improved but he is now on 25 L of heated high flow nasal cannula with 80% FiO2.  Nursing states that he has had no bloody bowel movements today.  He continues to diurese well.  If his respiratory status does not improve we will reinvolve pulmonary and obtain CT scan of the chest.  Objective: Vitals:   12/31/19 0800 12/31/19 1000 12/31/19 1200 12/31/19 1400  BP: (!) 92/56 (!) 110/42 (!) 104/40  (!) 123/49  Pulse: 73 73 74 73  Resp: 18 (!) 21 15 (!) 23  Temp: 97.6 F (36.4 C)  98.3 F (36.8 C)   TempSrc: Oral  Oral   SpO2: 100% 100% 90% 94%  Weight:      Height:        Intake/Output Summary (Last 24 hours) at 12/31/2019 1546 Last data filed at 12/31/2019 1133 Gross per 24 hour  Intake 480 ml  Output 1875 ml  Net -1395 ml  Filed Weights   12/29/19 0500 12/30/19 0500 12/31/19 0400  Weight: 71.2 kg 71 kg 71 kg   Examination: Physical Exam:  Constitutional: Patient is a thin elderly Caucasian male currently in slight respiratory distress and he does appear a little uncomfortable.  He is wearing 25 L of heated high flow nasal cannula and states he still feels a little short of breath.  Saturations are 100% now.e Eyes: Lids and conjunctivae normal, sclerae anicteric  ENMT: External Ears, Nose appear normal. Grossly normal hearing.  Neck: Appears normal, supple, no cervical masses, normal ROM, no appreciable thyromegaly; mild JVD Respiratory: Diminished to auscultation bilaterally with coarse breath sounds and some rhonchi crackles worse in the left compared to right.  He has had slightly increased respiratory effort and he is on heated high flow nasal cannula at 25 L and 80% FiO2 this morning. Cardiovascular: Irregularly irregular slightly tachycardic, no murmurs / rubs / gallops. S1 and S2 auscultated.  1+ edema  Abdomen: Soft, non-tender, non-distended. Bowel sounds positive.  GU: Deferred. Musculoskeletal: No clubbing / cyanosis of digits/nails. No joint deformity upper and lower extremities.  Skin: No rashes, lesions, ulcers on limited skin evaluation. No induration; Warm and dry.  Neurologic: CN 2-12 grossly intact with no focal deficits. Romberg sign and cerebellar reflexes not assessed.  Psychiatric: Normal judgment and insight. Alert and oriented x 3.  Mildly anxious mood and appropriate affect.   Data Reviewed: I have personally reviewed following labs and imaging  studies  CBC: Recent Labs  Lab 12/27/19 0203 12/27/19 0203 12/28/19 0135 12/28/19 0135 12/28/19 1035 12/28/19 1634 12/29/19 0211 12/30/19 0257 12/31/19 0210  WBC 11.4*   < > 10.5  --  10.4  --  10.6* 10.2 10.1  NEUTROABS 9.8*  --  8.6*  --   --   --  8.6* 7.9* 8.0*  HGB 7.8*   < > 7.2*   < > 7.4* 8.3* 7.7* 8.2* 8.0*  HCT 26.0*   < > 24.3*   < > 25.5* 27.8* 26.1* 26.8* 26.0*  MCV 87.8   < > 88.4  --  89.2  --  89.4 87.3 86.1  PLT 185   < > 191  --  195  --  207 233 242   < > = values in this interval not displayed.   Basic Metabolic Panel: Recent Labs  Lab 12/27/19 0203 12/28/19 0135 12/29/19 0211 12/30/19 0257 12/31/19 0210  NA 133* 133* 133* 135 135  K 4.5 4.6 4.9 4.7 3.9  CL 98 99 94* 95* 93*  CO2 28 30 30  33* 31  GLUCOSE 191* 170* 210* 148* 126*  BUN 24* 28* 27* 21 23  CREATININE 0.88 0.78 0.66 0.65 0.69  CALCIUM 8.5* 8.2* 8.1* 7.9* 7.8*  MG 2.2 2.1 2.1 1.9 1.9  PHOS 4.4 3.9 3.0 3.1 3.7   GFR: Estimated Creatinine Clearance: 83.8 mL/min (by C-G formula based on SCr of 0.69 mg/dL). Liver Function Tests: Recent Labs  Lab 12/27/19 0203 12/28/19 0135 12/29/19 0211 12/30/19 0257 12/31/19 0210  AST 9* 11* 8* 8* 9*  ALT 7 8 8 8 7   ALKPHOS 71 71 70 67 61  BILITOT 0.9 0.8 0.8 1.4* 1.5*  PROT 5.7* 5.5* 5.5* 5.5* 5.5*  ALBUMIN 2.9* 2.7* 2.6* 2.5* 2.5*   No results for input(s): LIPASE, AMYLASE in the last 168 hours. No results for input(s): AMMONIA in the last 168 hours. Coagulation Profile: No results for input(s): INR, PROTIME in the last 168 hours. Cardiac Enzymes:  No results for input(s): CKTOTAL, CKMB, CKMBINDEX, TROPONINI in the last 168 hours. BNP (last 3 results) No results for input(s): PROBNP in the last 8760 hours. HbA1C: No results for input(s): HGBA1C in the last 72 hours. CBG: Recent Labs  Lab 12/30/19 1657 12/30/19 2135 12/30/19 2219 12/31/19 0814 12/31/19 1200  GLUCAP 232* 100* 94 135* 179*   Lipid Profile: No results for  input(s): CHOL, HDL, LDLCALC, TRIG, CHOLHDL, LDLDIRECT in the last 72 hours. Thyroid Function Tests: No results for input(s): TSH, T4TOTAL, FREET4, T3FREE, THYROIDAB in the last 72 hours. Anemia Panel: No results for input(s): VITAMINB12, FOLATE, FERRITIN, TIBC, IRON, RETICCTPCT in the last 72 hours. Sepsis Labs: No results for input(s): PROCALCITON, LATICACIDVEN in the last 168 hours.  Recent Results (from the past 240 hour(s))  SARS Coronavirus 2 by RT PCR (hospital order, performed in [facility] hospital lab) Nasopharyngeal Nasopharyngeal Swab     Status: None   Collection Time: 12/24/19  7:29 AM   Specimen: Nasopharyngeal Swab  Result Value Ref Range Status   SARS Coronavirus 2 NEGATIVE NEGATIVE Final    Comment: (NOTE) SARS-CoV-2 target nucleic acids are NOT DETECTED.  The SARS-CoV-2 RNA is generally detectable in upper and lower respiratory specimens during the acute phase of infection. The lowest concentration of SARS-CoV-2 viral copies this assay can detect is 250 copies / mL. A negative result does not preclude SARS-CoV-2 infection and should not be used as the sole basis for treatment or other patient management decisions.  A negative result may occur with improper specimen collection / handling, submission of specimen other than nasopharyngeal swab, presence of viral mutation(s) within the areas targeted by this assay, and inadequate number of viral copies (<250 copies / mL). A negative result must be combined with clinical observations, patient history, and epidemiological information.  Fact Sheet for Patients:   StrictlyIdeas.no  Fact Sheet for Healthcare Providers: BankingDealers.co.za  This test is not yet approved or  cleared by the [place] FDA and has been authorized for detection and/or diagnosis of SARS-CoV-2 by FDA under an Emergency Use Authorization (EUA).  This EUA will remain in effect (meaning this  test can be used) for the duration of the COVID-19 declaration under Section 564(b)(1) of the Act, 21 U.S.C. section 360bbb-3(b)(1), unless the authorization is terminated or revoked sooner.  Performed at [facility], [place] [address], [place], [place] [address]   MRSA PCR Screening     Status: None   Collection Time: 12/24/19 11:05 AM   Specimen: Nasal Mucosa; Nasopharyngeal  Result Value Ref Range Status   MRSA by PCR NEGATIVE NEGATIVE Final    Comment:        The GeneXpert MRSA Assay (FDA approved for NASAL specimens only), is one component of a comprehensive MRSA colonization surveillance program. It is not intended to diagnose MRSA infection nor to guide or monitor treatment for MRSA infections. Performed at [facility], [place] [address], [place], [place] [address]      RN Pressure Injury Documentation: Pressure Injury 12/08/19 Coccyx Mid;Right;Left Stage 1 -  Intact skin with non-blanchable redness of a localized area usually over a bony prominence. redness or discoloration on coccyx (Active)  12/08/19 0700  Location: Coccyx  Location Orientation: Mid;Right;Left  Staging: Stage 1 -  Intact skin with non-blanchable redness of a localized area usually over a bony prominence.  Wound Description (Comments): redness or discoloration on coccyx  Present on Admission: Yes     Pressure Injury 12/24/19  Coccyx Left;Right Stage 1 -  Intact skin with non-blanchable redness of a localized area usually over a bony prominence. (Active)  12/24/19   Location: Coccyx  Location Orientation: Left;Right  Staging: Stage 1 -  Intact skin with non-blanchable redness of a localized area usually over a bony prominence.  Wound Description (Comments):   Present on Admission: Yes     Pressure Injury 12/28/19 Ear Left Stage 2 -  Partial thickness loss of dermis presenting as a shallow open injury with a red, pink wound bed without slough. back of L ear lobe,  redness with broken skin (Active)  12/28/19 0000  Location: Ear  Location Orientation: Left  Staging: Stage 2 -  Partial thickness loss of dermis presenting as a shallow open injury with a red, pink wound bed without slough.  Wound Description (Comments): back of L ear lobe, redness with broken skin  Present on Admission: No     Estimated body mass index is 20.65 kg/m as calculated from the following:   Height as of this encounter: 6\' 1"  (1.854 m).   Weight as of this encounter: 71 kg.  Malnutrition Type:  Nutrition Problem: Severe Malnutrition Etiology: chronic illness (COPD and lung cancer)   Malnutrition Characteristics:  Signs/Symptoms: moderate fat depletion, moderate muscle depletion, severe fat depletion, severe muscle depletion   Nutrition Interventions:  Interventions: Boost Breeze, Magic cup, MVI, Other (Comment) Dillard [place])   Radiology Studies: DG CHEST PORT 1 VIEW  Result Date: 12/31/2019 CLINICAL DATA:  Shortness of breath. EXAM: PORTABLE CHEST 1 VIEW COMPARISON:  12/30/2019 FINDINGS: An ICD remains in place. The cardiac silhouette remains enlarged. Widespread airspace disease in the right greater than left lungs and asymmetric left basilar volume loss have not significantly changed. There are persistent small bilateral pleural effusions. No pneumothorax is identified. IMPRESSION: Unchanged widespread bilateral airspace disease and small bilateral pleural effusions. Electronically Signed   By: Logan Bores M.D.   On: 12/31/2019 09:17   DG CHEST PORT 1 VIEW  Result Date: 12/30/2019 CLINICAL DATA:  Short of breath EXAM: PORTABLE CHEST 1 VIEW COMPARISON:  01/06/2020 FINDINGS: Bilateral airspace disease right greater than left. Mild improvement in airspace disease on the right. No change on the left. Small bilateral pleural effusions. Left lower lobe atelectasis unchanged. AICD unchanged. IMPRESSION: Diffuse bilateral airspace disease with mild improvement on the right  probable edema. Electronically Signed   By: Franchot Gallo M.D.   On: 12/30/2019 08:22    Scheduled Meds: . sodium chloride   Intravenous Once  . atorvastatin  10 mg Oral Daily  . bethanechol  10 mg Oral TID  . Chlorhexidine Gluconate Cloth  6 each Topical Daily  . citalopram  10 mg Oral Daily  . digoxin  0.125 mg Oral Daily  . feeding supplement  1 Container Oral BID BM  . furosemide  80 mg Intravenous BID  . insulin aspart  0-5 Units Subcutaneous QHS  . insulin aspart  0-9 Units Subcutaneous TID WC  . ipratropium-albuterol  3 mL Nebulization TID  . mouth rinse  15 mL Mouth Rinse BID  . pantoprazole  40 mg Oral Daily  . tamsulosin  0.4 mg Oral QPC supper  . cyanocobalamin  1,000 mcg Oral Daily   Continuous Infusions:   LOS: 7 days   Kerney Elbe, DO [facility] PAGER is on AMION  If 7PM-7AM, please contact night-coverage [email or web address]

## 2019-12-31 NOTE — Progress Notes (Signed)
Pt. placed back to 80% Fi02 and 25L after desaturated down to 86% with no apparent distress noted.

## 2020-01-01 DIAGNOSIS — E43 Unspecified severe protein-calorie malnutrition: Secondary | ICD-10-CM

## 2020-01-01 DIAGNOSIS — I5033 Acute on chronic diastolic (congestive) heart failure: Secondary | ICD-10-CM

## 2020-01-01 DIAGNOSIS — K921 Melena: Principal | ICD-10-CM

## 2020-01-01 LAB — COMPREHENSIVE METABOLIC PANEL
ALT: 8 U/L (ref 0–44)
AST: 11 U/L — ABNORMAL LOW (ref 15–41)
Albumin: 2.4 g/dL — ABNORMAL LOW (ref 3.5–5.0)
Alkaline Phosphatase: 67 U/L (ref 38–126)
Anion gap: 11 (ref 5–15)
BUN: 25 mg/dL — ABNORMAL HIGH (ref 8–23)
CO2: 31 mmol/L (ref 22–32)
Calcium: 7.8 mg/dL — ABNORMAL LOW (ref 8.9–10.3)
Chloride: 91 mmol/L — ABNORMAL LOW (ref 98–111)
Creatinine, Ser: 0.81 mg/dL (ref 0.61–1.24)
GFR calc Af Amer: >60 mL/min (ref >60)
GFR calc non Af Amer: >60 mL/min (ref >60)
Glucose, Bld: 161 mg/dL — ABNORMAL HIGH (ref 70–99)
Potassium: 3.8 mmol/L (ref 3.5–5.1)
Sodium: 133 mmol/L — ABNORMAL LOW (ref 135–145)
Total Bilirubin: 1.7 mg/dL — ABNORMAL HIGH (ref 0.3–1.2)
Total Protein: 5.7 g/dL — ABNORMAL LOW (ref 6.5–8.1)

## 2020-01-01 LAB — CBC WITH DIFFERENTIAL/PLATELET
Abs Immature Granulocytes: 0.03 10*3/uL (ref 0.00–0.07)
Basophils Absolute: 0.1 10*3/uL (ref 0.0–0.1)
Basophils Relative: 1 %
Eosinophils Absolute: 0.1 10*3/uL (ref 0.0–0.5)
Eosinophils Relative: 1 %
HCT: 26.4 % — ABNORMAL LOW (ref 39.0–52.0)
Hemoglobin: 8 g/dL — ABNORMAL LOW (ref 13.0–17.0)
Immature Granulocytes: 0 %
Lymphocytes Relative: 5 %
Lymphs Abs: 0.5 10*3/uL — ABNORMAL LOW (ref 0.7–4.0)
MCH: 26.1 pg (ref 26.0–34.0)
MCHC: 30.3 g/dL (ref 30.0–36.0)
MCV: 86.3 fL (ref 80.0–100.0)
Monocytes Absolute: 1 10*3/uL (ref 0.1–1.0)
Monocytes Relative: 11 %
Neutro Abs: 7.9 10*3/uL — ABNORMAL HIGH (ref 1.7–7.7)
Neutrophils Relative %: 82 %
Platelets: 263 10*3/uL (ref 150–400)
RBC: 3.06 MIL/uL — ABNORMAL LOW (ref 4.22–5.81)
RDW: 21.5 % — ABNORMAL HIGH (ref 11.5–15.5)
WBC: 9.7 10*3/uL (ref 4.0–10.5)
nRBC: 0 % (ref 0.0–0.2)

## 2020-01-01 LAB — MAGNESIUM: Magnesium: 2 mg/dL (ref 1.7–2.4)

## 2020-01-01 LAB — GLUCOSE, CAPILLARY
Glucose-Capillary: 138 mg/dL — ABNORMAL HIGH (ref 70–99)
Glucose-Capillary: 246 mg/dL — ABNORMAL HIGH (ref 70–99)
Glucose-Capillary: 155 mg/dL — ABNORMAL HIGH (ref 70–99)
Glucose-Capillary: 170 mg/dL — ABNORMAL HIGH (ref 70–99)

## 2020-01-01 LAB — PHOSPHORUS: Phosphorus: 4 mg/dL (ref 2.5–4.6)

## 2020-01-01 MED ORDER — POLYETHYLENE GLYCOL 3350 17 G PO PACK: 17.0000 g | PACK | Freq: Every day | ORAL | Status: DC | PRN

## 2020-01-01 MED ORDER — POTASSIUM CHLORIDE CRYS ER 20 MEQ PO TBCR
40.0000 meq | EXTENDED_RELEASE_TABLET | Freq: Once | ORAL | Status: AC
  Administered 2020-01-01: 40 meq via ORAL
  Filled 2020-01-01: qty 2

## 2020-01-01 MED ORDER — FUROSEMIDE 40 MG PO TABS
80.0000 mg | ORAL_TABLET | Freq: Two times a day (BID) | ORAL | Status: DC
  Administered 2020-01-01 – 2020-01-04 (×7): 80 mg via ORAL
  Filled 2020-01-01 (×7): qty 2

## 2020-01-01 MED ORDER — BUDESONIDE 0.5 MG/2ML IN SUSP
0.5000 mg | Freq: Two times a day (BID) | RESPIRATORY_TRACT | Status: DC
  Administered 2020-01-01 – 2020-01-15 (×29): 0.5 mg via RESPIRATORY_TRACT
  Filled 2020-01-01 (×28): qty 2

## 2020-01-01 MED ORDER — SENNOSIDES-DOCUSATE SODIUM 8.6-50 MG PO TABS: 2.0000 | ORAL_TABLET | Freq: Every evening | ORAL | Status: DC | PRN

## 2020-01-01 MED ORDER — DM-GUAIFENESIN ER 30-600 MG PO TB12
1.0000 | ORAL_TABLET | Freq: Two times a day (BID) | ORAL | Status: DC
  Administered 2020-01-01 – 2020-01-15 (×28): 1 via ORAL
  Filled 2020-01-01 (×28): qty 1

## 2020-01-01 NOTE — Progress Notes (Signed)
Progress Note  Patient Name: [name] Date of Encounter: 01/01/2020  [facility] Cardiologist: Candee Furbish, MD   Subjective   Feeling short of breath.  Reports suprapubic pain which is new today.  Denies chest pain.  Inpatient Medications    Scheduled Meds: . sodium chloride   Intravenous Once  . atorvastatin  10 mg Oral Daily  . bethanechol  10 mg Oral TID  . Chlorhexidine Gluconate Cloth  6 each Topical Daily  . citalopram  10 mg Oral Daily  . digoxin  0.125 mg Oral Daily  . feeding supplement  1 Container Oral BID BM  . furosemide  80 mg Intravenous BID  . insulin aspart  0-5 Units Subcutaneous QHS  . insulin aspart  0-9 Units Subcutaneous TID WC  . ipratropium-albuterol  3 mL Nebulization TID  . mouth rinse  15 mL Mouth Rinse BID  . pantoprazole  40 mg Oral Daily  . tamsulosin  0.4 mg Oral QPC supper  . cyanocobalamin  1,000 mcg Oral Daily   Continuous Infusions:  PRN Meds: albuterol, ondansetron (ZOFRAN) IV, oxyCODONE-acetaminophen, polyethylene glycol, senna-docusate   Vital Signs    Vitals:   01/01/20 0500 01/01/20 0800 01/01/20 1000 01/01/20 1100  BP:  (!) 103/47 (!) 119/55   Pulse:  74 74 67  Resp:  20 17 19   Temp:  (!) 97.4 F (36.3 C)    TempSrc:  Oral    SpO2:  93% (!) 89% 93%  Weight: 71 kg     Height:        Intake/Output Summary (Last 24 hours) at 01/01/2020 1114 Last data filed at 01/01/2020 0530 Gross per 24 hour  Intake 240 ml  Output 1200 ml  Net -960 ml   Last 3 Weights 01/01/2020 12/31/2019 12/30/2019  Weight (lbs) 156 lb 8.4 oz 156 lb 8.4 oz 156 lb 8.4 oz  Weight (kg) 71 kg 71 kg 71 kg      Telemetry    Sinus rhythm.  Frequent PVCs and NSVT.  Ventricular bigeminy.- Personally Reviewed  ECG    n/a - Personally Reviewed  Physical Exam   VS:  BP (!) 119/55   Pulse 67   Temp (!) 97.4 F (36.3 C) (Oral)   Resp 19   Ht 6\' 1"  (1.854 m)   Wt 71 kg   SpO2 93%   BMI 20.65 kg/m  , BMI Body mass index is 20.65  kg/m. GENERAL: Critically ill-appearing.  Dyspneic.  Frail. HEENT: Pupils equal round and reactive, fundi not visualized, oral mucosa unremarkable NECK:  No jugular venous distention, waveform within normal limits, carotid upstroke brisk and symmetric, no bruits LUNGS: Coarse breath sounds throughout. HEART: Mostly regular with occasional ectopy.  PMI not displaced or sustained,S1 and S2 within normal limits, no S3, no S4, no clicks, no rubs, no murmurs ABD:  Flat, positive bowel sounds normal in frequency in pitch, no bruits, no rebound, no guarding, no midline pulsatile mass, no hepatomegaly, no splenomegaly EXT:  2 plus pulses throughout, no edema, no cyanosis no clubbing SKIN:  No rashes no nodules NEURO:  Cranial nerves II through XII grossly intact, motor grossly intact throughout PSYCH:  Cognitively intact, oriented to person place and time   Labs    High Sensitivity Troponin:   Recent Labs  Lab 12/06/19 1824 12/06/19 2140  TROPONINIHS 19* 19*      Chemistry Recent Labs  Lab 12/30/19 0257 12/31/19 0210 01/01/20 0201  NA 135 135 133*  K  4.7 3.9 3.8  CL 95* 93* 91*  CO2 33* 31 31  GLUCOSE 148* 126* 161*  BUN 21 23 25*  CREATININE 0.65 0.69 0.81  CALCIUM 7.9* 7.8* 7.8*  PROT 5.5* 5.5* 5.7*  ALBUMIN 2.5* 2.5* 2.4*  AST 8* 9* 11*  ALT 8 7 8   ALKPHOS 67 61 67  BILITOT 1.4* 1.5* 1.7*  GFRNONAA >60 >60 >60  GFRAA >60 >60 >60  ANIONGAP 7 11 11      Hematology Recent Labs  Lab 12/30/19 0257 12/31/19 0210 01/01/20 0201  WBC 10.2 10.1 9.7  RBC 3.07* 3.02* 3.06*  HGB 8.2* 8.0* 8.0*  HCT 26.8* 26.0* 26.4*  MCV 87.3 86.1 86.3  MCH 26.7 26.5 26.1  MCHC 30.6 30.8 30.3  RDW 21.6* 21.5* 21.5*  PLT 233 242 263    BNP Recent Labs  Lab 12/29/19 0211  BNP 1,473.9*     DDimer No results for input(s): DDIMER in the last 168 hours.   Radiology    CT CHEST W CONTRAST  Result Date: 12/31/2019 CLINICAL DATA:  Dyspnea. EXAM: CT CHEST WITH CONTRAST TECHNIQUE:  Multidetector CT imaging of the chest was performed during intravenous contrast administration. CONTRAST:  66mL OMNIPAQUE IOHEXOL 300 MG/ML  SOLN COMPARISON:  Dec 06, 2019 FINDINGS: Cardiovascular: There is a dual lead AICD. There is dilatation of the right pulmonary artery (3.7 cm). Stable marked severity cardiomegaly is noted. No pericardial effusion. 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 Marked severity coronary artery calcification is also seen. Mediastinum/Nodes: There is stable mild to moderate severity pretracheal and AP window lymphadenopathy. Lungs/Pleura: There is marked severity emphysematous lung disease. Stable mild to moderate severity areas of atelectasis and/or infiltrate are seen within the posterior aspects of the bilateral lower lobes. Stable bilateral moderate severity pleural effusions are seen with stable loculated components. There is no evidence of a pneumothorax. Upper Abdomen: Stable left renal cysts are noted. Musculoskeletal: Chronic right-sided rib deformities are seen with multilevel degenerative changes noted throughout the thoracic spine. IMPRESSION: 1. Stable mild to moderate severity posterior bilateral lower lobe atelectasis and/or infiltrate. 2. Stable bilateral moderate severity pleural effusions with stable loculated components. 3. Marked severity cardiomegaly. 4. Stable sinus of Valsalva aneurysm. 5. 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 6. Left renal cysts. 7. Emphysema. Emphysema (ICD10-J43.9). Electronically Signed   By: Virgina Norfolk M.D.   On: 12/31/2019 19:57   DG CHEST PORT 1 VIEW  Result Date: 12/31/2019 CLINICAL DATA:  Shortness of breath. EXAM: PORTABLE CHEST 1 VIEW COMPARISON:  12/30/2019 FINDINGS: An ICD remains in place. The cardiac silhouette remains enlarged. Widespread airspace disease in the right greater than left lungs and asymmetric left basilar volume loss have not significantly changed. There are persistent small  bilateral pleural effusions. No pneumothorax is identified. IMPRESSION: Unchanged widespread bilateral airspace disease and small bilateral pleural effusions. Electronically Signed   By: Logan Bores M.D.   On: 12/31/2019 09:17    Cardiac Studies   TTE 11/14/19: 1. LVEF is severely depressed with diffuse hypokinesis, worse in the  septal and apical walls Compared to report from Nov 2020 echo, LVEF is  worse (apical views foreshortened in 2020 exam which makes assessment  difficult). Left ventricular ejection  fraction, by estimation, is 25 to 30%. The left ventricle has severely  decreased function. The left ventricle demonstrates global hypokinesis.  There is mild left ventricular hypertrophy. Left ventricular diastolic  parameters are indeterminate.  2. Right ventricular systolic function is moderately reduced.  The right  ventricular size is severely enlarged. There is moderately elevated  pulmonary artery systolic pressure.  3. Left atrial size was severely dilated.  4. Right atrial size was severely dilated.  5. A 33 mm [employer]tr[employer] Mosai bioprosthesisiis present Peak and mean  gradients through the vave are 15 and 6 mm Hg respectively, both lower  than previous echo report in 2020.Marland Kitchen The mitral valve has been  repaired/replaced. Trivial mitral valve regurgitation.  6. The aortic valve is abnormal. Aortic valve regurgitation is not  visualized. Mild to moderate aortic valve sclerosis/calcification is  present, without any evidence of aortic stenosis.  7. Aortic root is 51 mm Previous echo measured echo above this; current  exam did not image that high.. Aortic dilatation noted. Aneurysm of the  aortic root. There is moderate to severe dilatation of the aortic root  measuring 51 mm.  8. The inferior vena cava is diated in size with <50% respiratory  variability, suggesting right atrial pressure of 15 mmHg.   Patient Profile     [name] is a 45M with chronic systolic and  diastolic heart failure (LVEF 25-30%), R ventricular failure, severe aneurysm of the aortic root, s/p MVR, COPD, and permanent atrial fibrillation admitted with acute on chronic hypoxic respiratory failure, GI bleed and obstructive cecal mass.  Assessment & Plan     #Acute on chronic systolic and diastolic heart failure: Volume status improving.  He has not seem to be volume overloaded to me on exam.  He is net -6.9 L this hospitalization.  BUN is starting to creep.  Chest CT yesterday was negative for pulmonary edema but did show moderate pleural effusions.  We will switch Lasix from IV to 80 mg p.o. twice daily.  Continue digoxin.  #NSVT, PVCs: Unable to add beta-blocker due to hypotension.      For questions or updates, please contact [place] Please consult [email or web address] for contact info under        Signed, Skeet Latch, MD  01/01/2020, 11:14 AM

## 2020-01-01 NOTE — Progress Notes (Signed)
PROGRESS NOTE    KRIS NO  IOE:[id number] DOB: 19-Jul-1945 DOA: 12/24/2019 PCP: [name], NP   Brief Narrative:  74 year old with history of CHF EF 25% status post BiV ICD, severe COPD, respiratory failure, permanent A. fib status post AV node ablation, ICH, bioprosthetic MVR, DM2, NSCLC s/p resection HTN admitted for GI bleed to have cecal mass complicated by CHF exacerbation. CTA abd= neg for GI bleed. Family doesn't want EGD or C scope. Currently PRN transfusions and lasix. Palliative team following for now.    Assessment & Plan:   Active Problems:   Pressure ulcer   Gastrointestinal hemorrhage   Acute blood loss anemia   Protein-calorie malnutrition, severe   Hemorrhagic shock (HCC)   SOB (shortness of breath)   Hypoxia   Generalized pain   General weakness   Shortness of breath  Acute on chronic combined systolic and diastolic CHF with an EF of 25 to 30% status post AICD and pacemaker CAD Permanent A fib,  WHO group 2 and 3 pulmonary HTN HLD. Currently rate controlled.  Not on anticoagulation due to bleeding risk and history of ICH Lasix 80 mg IV twice daily, monitor urine output.  Daily input and output BiPAP as necessary. CT chest shows bilateral pleural effusions.  Lower GI bleeding with concern for cecal mass noted on CT abd/pelvis 12/06/19. Does not want any endoscopic evaluation at this time.  Palliative care team following.  Supportive care, transfuse as needed.  Suprapubic pain -Advised nursing staff to bladder scan him.  Acute on chronic hypoxic respiratory failure, worsening and on heated high flow nasal cannula secondary to severe COPD/emphysema History of non-small cell lung cancer status post VATS Aggressive scheduled and as needed bronchodilators.  He is not wheezing therefore no need for steroids at the moment.  DM type II poorly controlled with hyperglycemia. Metformin on hold currently on insulin sliding scale and Accu-Chek.  Hx of  Depression. Citalopram 10 mg daily  BPH, urine retention. Bethanechol 10 mg po TID, Tamsuolsin 0.4 po Daily  Deconditioning. PT/OT once medically stable  History of alcoholic cirrhosis Supportive care.  Overall appears to be compensated from a hepatic standpoint  Goals of care. Palliative care team following, currently patient is DNR.  Continue management as planned above with as needed BiPAP.  Stage 1 Coccyx Ulcer, pOA -Continue Wound Care  Overall poor prognosis, patient understands this.  He is very poor/minimal pulmonary reserve now complicated by CHF exacerbation, and pleural effusions  DVT prophylaxis: Place and maintain sequential compression device Start: 12/24/19 0843   Code Status: DNR Family Communication: None  Status is: Inpatient  Remains inpatient appropriate because:Hemodynamically unstable   Dispo: The patient is from: Home              Anticipated d/c is to: To be determined              Anticipated d/c date is: > 3 days              Patient currently is not medically stable to d/c.  Still quite hypoxic on heated high flow     Subjective: Still having exertional dyspnea and remains on heated high flow.  Review of Systems Otherwise negative except as per HPI, including: General: Denies fever, chills, night sweats or unintended weight loss. Resp: Denies hemoptysis Cardiac: Denies chest pain, palpitations, orthopnea, paroxysmal nocturnal dyspnea. GI: Denies abdominal pain, nausea, vomiting, diarrhea or constipation GU: Denies dysuria, frequency, hesitancy or incontinence MS: Denies muscle aches, joint  pain or swelling Neuro: Denies headache, neurologic deficits (focal weakness, numbness, tingling), abnormal gait Psych: Denies anxiety, depression, SI/HI/AVH Skin: Denies new rashes or lesions ID: Denies sick contacts, exotic exposures, travel  Examination:  General exam: Appears calm and comfortable, heated high flow, bilateral temporal  wasting.  On heated high flow Respiratory system: Diffuse bilateral rhonchi Cardiovascular system: S1 & S2 heard, RRR. No JVD, murmurs, rubs, gallops or clicks. No pedal edema. Gastrointestinal system: Abdomen is nondistended, soft and nontender. No organomegaly or masses felt. Normal bowel sounds heard. Central nervous system: Alert and oriented. No focal neurological deficits. Extremities: Symmetric 5 x 5 power. Skin: No rashes, lesions or ulcers Psychiatry: Judgement and insight appear normal. Mood & affect appropriate.     Objective: Vitals:   01/01/20 0326 01/01/20 0342 01/01/20 0400 01/01/20 0500  BP: (!) 107/44  (!) 112/51   Pulse: 74  74   Resp: (!) 21  (!) 25   Temp:  98.5 F (36.9 C)    TempSrc:  Axillary    SpO2: 95%  97%   Weight:    71 kg  Height:        Intake/Output Summary (Last 24 hours) at 01/01/2020 0801 Last data filed at 01/01/2020 0530 Gross per 24 hour  Intake 480 ml  Output 1200 ml  Net -720 ml   Filed Weights   12/30/19 0500 12/31/19 0400 01/01/20 0500  Weight: 71 kg 71 kg 71 kg     Data Reviewed:   CBC: Recent Labs  Lab 12/28/19 0135 12/28/19 0135 12/28/19 1035 12/28/19 1035 12/28/19 1634 12/29/19 0211 12/30/19 0257 12/31/19 0210 01/01/20 0201  WBC 10.5   < > 10.4  --   --  10.6* 10.2 10.1 9.7  NEUTROABS 8.6*  --   --   --   --  8.6* 7.9* 8.0* 7.9*  HGB 7.2*   < > 7.4*   < > 8.3* 7.7* 8.2* 8.0* 8.0*  HCT 24.3*   < > 25.5*   < > 27.8* 26.1* 26.8* 26.0* 26.4*  MCV 88.4   < > 89.2  --   --  89.4 87.3 86.1 86.3  PLT 191   < > 195  --   --  207 233 242 263   < > = values in this interval not displayed.   Basic Metabolic Panel: Recent Labs  Lab 12/28/19 0135 12/29/19 0211 12/30/19 0257 12/31/19 0210 01/01/20 0201  NA 133* 133* 135 135 133*  K 4.6 4.9 4.7 3.9 3.8  CL 99 94* 95* 93* 91*  CO2 30 30 33* 31 31  GLUCOSE 170* 210* 148* 126* 161*  BUN 28* 27* 21 23 25*  CREATININE 0.78 0.66 0.65 0.69 0.81  CALCIUM 8.2* 8.1* 7.9*  7.8* 7.8*  MG 2.1 2.1 1.9 1.9 2.0  PHOS 3.9 3.0 3.1 3.7 4.0   GFR: Estimated Creatinine Clearance: 82.8 mL/min (by C-G formula based on SCr of 0.81 mg/dL). Liver Function Tests: Recent Labs  Lab 12/28/19 0135 12/29/19 0211 12/30/19 0257 12/31/19 0210 01/01/20 0201  AST 11* 8* 8* 9* 11*  ALT 8 8 8 7 8   ALKPHOS 71 70 67 61 67  BILITOT 0.8 0.8 1.4* 1.5* 1.7*  PROT 5.5* 5.5* 5.5* 5.5* 5.7*  ALBUMIN 2.7* 2.6* 2.5* 2.5* 2.4*   No results for input(s): LIPASE, AMYLASE in the last 168 hours. No results for input(s): AMMONIA in the last 168 hours. Coagulation Profile: No results for input(s): INR, PROTIME in the last 168 hours.  Cardiac Enzymes: No results for input(s): CKTOTAL, CKMB, CKMBINDEX, TROPONINI in the last 168 hours. BNP (last 3 results) No results for input(s): PROBNP in the last 8760 hours. HbA1C: No results for input(s): HGBA1C in the last 72 hours. CBG: Recent Labs  Lab 12/30/19 2219 12/31/19 0814 12/31/19 1200 12/31/19 1633 12/31/19 2114  GLUCAP 94 135* 179* 158* 216*   Lipid Profile: No results for input(s): CHOL, HDL, LDLCALC, TRIG, CHOLHDL, LDLDIRECT in the last 72 hours. Thyroid Function Tests: No results for input(s): TSH, T4TOTAL, FREET4, T3FREE, THYROIDAB in the last 72 hours. Anemia Panel: No results for input(s): VITAMINB12, FOLATE, FERRITIN, TIBC, IRON, RETICCTPCT in the last 72 hours. Sepsis Labs: No results for input(s): PROCALCITON, LATICACIDVEN in the last 168 hours.  Recent Results (from the past 240 hour(s))  SARS Coronavirus 2 by RT PCR (hospital order, performed in [facility] hospital lab) Nasopharyngeal Nasopharyngeal Swab     Status: None   Collection Time: 12/24/19  7:29 AM   Specimen: Nasopharyngeal Swab  Result Value Ref Range Status   SARS Coronavirus 2 NEGATIVE NEGATIVE Final    Comment: (NOTE) SARS-CoV-2 target nucleic acids are NOT DETECTED.  The SARS-CoV-2 RNA is generally detectable in upper and lower respiratory  specimens during the acute phase of infection. The lowest concentration of SARS-CoV-2 viral copies this assay can detect is 250 copies / mL. A negative result does not preclude SARS-CoV-2 infection and should not be used as the sole basis for treatment or other patient management decisions.  A negative result may occur with improper specimen collection / handling, submission of specimen other than nasopharyngeal swab, presence of viral mutation(s) within the areas targeted by this assay, and inadequate number of viral copies (<250 copies / mL). A negative result must be combined with clinical observations, patient history, and epidemiological information.  Fact Sheet for Patients:   StrictlyIdeas.no  Fact Sheet for Healthcare Providers: BankingDealers.co.za  This test is not yet approved or  cleared by the [place] FDA and has been authorized for detection and/or diagnosis of SARS-CoV-2 by FDA under an Emergency Use Authorization (EUA).  This EUA will remain in effect (meaning this test can be used) for the duration of the COVID-19 declaration under Section 564(b)(1) of the Act, 21 U.S.C. section 360bbb-3(b)(1), unless the authorization is terminated or revoked sooner.  Performed at [facility], [place] [address], [place], [place] [address]   MRSA PCR Screening     Status: None   Collection Time: 12/24/19 11:05 AM   Specimen: Nasal Mucosa; Nasopharyngeal  Result Value Ref Range Status   MRSA by PCR NEGATIVE NEGATIVE Final    Comment:        The GeneXpert MRSA Assay (FDA approved for NASAL specimens only), is one component of a comprehensive MRSA colonization surveillance program. It is not intended to diagnose MRSA infection nor to guide or monitor treatment for MRSA infections. Performed at [facility], [place] [address] [name], [place] [address]          Radiology Studies: CT  CHEST W CONTRAST  Result Date: 12/31/2019 CLINICAL DATA:  Dyspnea. EXAM: CT CHEST WITH CONTRAST TECHNIQUE: Multidetector CT imaging of the chest was performed during intravenous contrast administration. CONTRAST:  36mL OMNIPAQUE IOHEXOL 300 MG/ML  SOLN COMPARISON:  Dec 06, 2019 FINDINGS: Cardiovascular: There is a dual lead AICD. There is dilatation of the right pulmonary artery (3.7 cm). Stable marked severity cardiomegaly is noted. No pericardial effusion. A stable sinus of Valsalva  aneurysm is seen. Marked severity coronary artery calcification is also seen. Mediastinum/Nodes: There is stable mild to moderate severity pretracheal and AP window lymphadenopathy. Lungs/Pleura: There is marked severity emphysematous lung disease. Stable mild to moderate severity areas of atelectasis and/or infiltrate are seen within the posterior aspects of the bilateral lower lobes. Stable bilateral moderate severity pleural effusions are seen with stable loculated components. There is no evidence of a pneumothorax. Upper Abdomen: Stable left renal cysts are noted. Musculoskeletal: Chronic right-sided rib deformities are seen with multilevel degenerative changes noted throughout the thoracic spine. IMPRESSION: 1. Stable mild to moderate severity posterior bilateral lower lobe atelectasis and/or infiltrate. 2. Stable bilateral moderate severity pleural effusions with stable loculated components. 3. Marked severity cardiomegaly. 4. Stable sinus of Valsalva aneurysm. 5. 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 6. Left renal cysts. 7. Emphysema. Emphysema (ICD10-J43.9). Electronically Signed   By: Virgina Norfolk M.D.   On: 12/31/2019 19:57   DG CHEST PORT 1 VIEW  Result Date: 12/31/2019 CLINICAL DATA:  Shortness of breath. EXAM: PORTABLE CHEST 1 VIEW COMPARISON:  12/30/2019 FINDINGS: An ICD remains in place. The cardiac silhouette remains enlarged. Widespread airspace disease in the right greater than left  lungs and asymmetric left basilar volume loss have not significantly changed. There are persistent small bilateral pleural effusions. No pneumothorax is identified. IMPRESSION: Unchanged widespread bilateral airspace disease and small bilateral pleural effusions. Electronically Signed   By: Logan Bores M.D.   On: 12/31/2019 09:17        Scheduled Meds: . sodium chloride   Intravenous Once  . atorvastatin  10 mg Oral Daily  . bethanechol  10 mg Oral TID  . Chlorhexidine Gluconate Cloth  6 each Topical Daily  . citalopram  10 mg Oral Daily  . digoxin  0.125 mg Oral Daily  . feeding supplement  1 Container Oral BID BM  . furosemide  80 mg Intravenous BID  . insulin aspart  0-5 Units Subcutaneous QHS  . insulin aspart  0-9 Units Subcutaneous TID WC  . ipratropium-albuterol  3 mL Nebulization TID  . mouth rinse  15 mL Mouth Rinse BID  . pantoprazole  40 mg Oral Daily  . tamsulosin  0.4 mg Oral QPC supper  . cyanocobalamin  1,000 mcg Oral Daily   Continuous Infusions:   LOS: 8 days   Time spent= 35 mins    Cana Mignano Arsenio Loader, MD [facility]  If 7PM-7AM, please contact night-coverage  01/01/2020, 8:01 AM

## 2020-01-02 LAB — BASIC METABOLIC PANEL
Anion gap: 9 (ref 5–15)
BUN: 22 mg/dL (ref 8–23)
CO2: 31 mmol/L (ref 22–32)
Calcium: 7.9 mg/dL — ABNORMAL LOW (ref 8.9–10.3)
Chloride: 92 mmol/L — ABNORMAL LOW (ref 98–111)
Creatinine, Ser: 0.68 mg/dL (ref 0.61–1.24)
GFR calc Af Amer: >60 mL/min (ref >60)
GFR calc non Af Amer: >60 mL/min (ref >60)
Glucose, Bld: 168 mg/dL — ABNORMAL HIGH (ref 70–99)
Potassium: 3.4 mmol/L — ABNORMAL LOW (ref 3.5–5.1)
Sodium: 132 mmol/L — ABNORMAL LOW (ref 135–145)

## 2020-01-02 LAB — CBC
HCT: 25 % — ABNORMAL LOW (ref 39.0–52.0)
Hemoglobin: 7.6 g/dL — ABNORMAL LOW (ref 13.0–17.0)
MCH: 26.3 pg (ref 26.0–34.0)
MCHC: 30.4 g/dL (ref 30.0–36.0)
MCV: 86.5 fL (ref 80.0–100.0)
Platelets: 261 10*3/uL (ref 150–400)
RBC: 2.89 MIL/uL — ABNORMAL LOW (ref 4.22–5.81)
RDW: 21.2 % — ABNORMAL HIGH (ref 11.5–15.5)
WBC: 10.1 10*3/uL (ref 4.0–10.5)
nRBC: 0.2 % (ref 0.0–0.2)

## 2020-01-02 LAB — GLUCOSE, CAPILLARY
Glucose-Capillary: 140 mg/dL — ABNORMAL HIGH (ref 70–99)
Glucose-Capillary: 238 mg/dL — ABNORMAL HIGH (ref 70–99)
Glucose-Capillary: 136 mg/dL — ABNORMAL HIGH (ref 70–99)
Glucose-Capillary: 204 mg/dL — ABNORMAL HIGH (ref 70–99)

## 2020-01-02 LAB — PROCALCITONIN: Procalcitonin: <0.1 ng/mL

## 2020-01-02 LAB — BRAIN NATRIURETIC PEPTIDE: B Natriuretic Peptide: 673.2 pg/mL — ABNORMAL HIGH (ref 0.0–100.0)

## 2020-01-02 LAB — MAGNESIUM: Magnesium: 1.9 mg/dL (ref 1.7–2.4)

## 2020-01-02 MED ORDER — POTASSIUM CHLORIDE CRYS ER 10 MEQ PO TBCR
30.0000 meq | EXTENDED_RELEASE_TABLET | ORAL | Status: AC
  Administered 2020-01-02 (×2): 30 meq via ORAL
  Filled 2020-01-02 (×2): qty 3

## 2020-01-02 NOTE — Progress Notes (Signed)
PROGRESS NOTE    [name]  WER:[id number] DOB: March 21, 1946 DOA: 12/24/2019 PCP: [name], NP   Brief Narrative:  74 year old with history of CHF EF 25% status post BiV ICD, severe COPD, respiratory failure, permanent A. fib status post AV node ablation, ICH, bioprosthetic MVR, DM2, NSCLC s/p resection HTN admitted for GI bleed to have cecal mass complicated by CHF exacerbation. CTA abd= neg for GI bleed. Family doesn't want EGD or C scope. Currently PRN transfusions and lasix. Palliative team following for now.   Assessment & Plan:   Active Problems:   Pressure ulcer   Gastrointestinal hemorrhage   Acute blood loss anemia   Protein-calorie malnutrition, severe   Hemorrhagic shock (HCC)   SOB (shortness of breath)   Hypoxia   Generalized pain   General weakness   Shortness of breath  Acute on chronic combined systolic and diastolic CHF with an EF of 25 to 30% status post AICD and pacemaker CAD Permanent A fib,  WHO group 2 and 3 pulmonary HTN HLD. Currently rate controlled.  Not on anticoagulation due to bleeding risk and history of ICH Continue Lasix twice daily, monitor urine output.  Replete electrolytes as necessary Plans to deactivate defibrillator function, continue BiV pacing. BiPAP as necessary. CT chest shows bilateral pleural effusions.  Lower GI bleeding with concern for cecal mass noted on CT abd/pelvis 12/06/19. Does not want any endoscopic evaluation at this time.  Palliative care team following.  Transfuse as necessary  Suprapubic pain Resolved  Acute on chronic hypoxic respiratory failure, worsening and on heated high flow nasal cannula secondary to severe COPD/emphysema History of non-small cell lung cancer status post VATS Continues to be short of breath.  Continue aggressive scheduled and as needed bronchodilators.  I-S/flutter  DM type II poorly controlled with hyperglycemia. Metformin on hold currently on insulin sliding scale and  Accu-Chek.  Hx of Depression. Citalopram 10 mg daily  BPH, urine retention. Bethanechol 10 mg po TID, Tamsuolsin 0.4 po Daily  Deconditioning. PT/OT once medically stable  History of alcoholic cirrhosis Supportive care.  Overall appears to be compensated from a hepatic standpoint  Goals of care. Palliative care team following, currently patient is DNR.  Continue management as planned above with as needed BiPAP.  Stage 1 Coccyx Ulcer, pOA -Continue Wound Care  Overall poor prognosis, patient understands this.  He is very poor/minimal pulmonary reserve now complicated by CHF exacerbation, and pleural effusions  DVT prophylaxis: Place and maintain sequential compression device Start: 12/24/19 0843  Code Status: DNR Family Communication: Randel Books, his daughter. She is updated.   Status is: Inpatient  Remains inpatient appropriate because:Hemodynamically unstable   Dispo: The patient is from: Home              Anticipated d/c is to: To be determined              Anticipated d/c date is: > 3 days              Patient currently is not medically stable to d/c.  Still quite hypoxic on heated high flow  Subjective: Remains on heated high flow, no better than yesterday in terms of his shortness of breath despite of negative fluid balance.  Review of Systems Otherwise negative except as per HPI, including: General: Denies fever, chills, night sweats or unintended weight loss. Resp: Denies hemoptysis Cardiac: Denies chest pain, palpitations, orthopnea, paroxysmal nocturnal dyspnea. GI: Denies abdominal pain, nausea, vomiting, diarrhea or constipation GU: Denies dysuria, frequency,  hesitancy or incontinence MS: Denies muscle aches, joint pain or swelling Neuro: Denies headache, neurologic deficits (focal weakness, numbness, tingling), abnormal gait Psych: Denies anxiety, depression, SI/HI/AVH Skin: Denies new rashes or lesions ID: Denies sick contacts, exotic exposures,  travel Examination:   Constitutional: Remains on heated high flow, comfortable at rest.  Appears frail with bilateral temporal wasting Respiratory: Bilateral coarse breath sounds Cardiovascular: Normal sinus rhythm, no rubs Abdomen: Nontender nondistended good bowel sounds Musculoskeletal: No edema noted Skin: No rashes seen Neurologic: CN 2-12 grossly intact.  And nonfocal Psychiatric: Normal judgment and insight. Alert and oriented x 3. Normal mood.   Objective: Vitals:   01/02/20 0843 01/02/20 0845 01/02/20 0900 01/02/20 1000  BP:    (!) 115/43  Pulse:   (!) 58 78  Resp:   18 (!) 22  Temp: 97.9 F (36.6 C)     TempSrc: Oral     SpO2: (!) 89% 91%    Weight:      Height:        Intake/Output Summary (Last 24 hours) at 01/02/2020 1132 Last data filed at 01/02/2020 0900 Gross per 24 hour  Intake 600 ml  Output 1850 ml  Net -1250 ml   Filed Weights   12/31/19 0400 01/01/20 0500 01/02/20 0453  Weight: 71 kg 71 kg 67.9 kg     Data Reviewed:   CBC: Recent Labs  Lab 12/28/19 0135 12/28/19 1035 12/29/19 0211 12/30/19 0257 12/31/19 0210 01/01/20 0201 01/02/20 0150  WBC 10.5   < > 10.6* 10.2 10.1 9.7 10.1  NEUTROABS 8.6*  --  8.6* 7.9* 8.0* 7.9*  --   HGB 7.2*   < > 7.7* 8.2* 8.0* 8.0* 7.6*  HCT 24.3*   < > 26.1* 26.8* 26.0* 26.4* 25.0*  MCV 88.4   < > 89.4 87.3 86.1 86.3 86.5  PLT 191   < > 207 233 242 263 261   < > = values in this interval not displayed.   Basic Metabolic Panel: Recent Labs  Lab 12/28/19 0135 12/28/19 0135 12/29/19 0211 12/30/19 0257 12/31/19 0210 01/01/20 0201 01/02/20 0150  NA 133*   < > 133* 135 135 133* 132*  K 4.6   < > 4.9 4.7 3.9 3.8 3.4*  CL 99   < > 94* 95* 93* 91* 92*  CO2 30   < > 30 33* 31 31 31   GLUCOSE 170*   < > 210* 148* 126* 161* 168*  BUN 28*   < > 27* 21 23 25* 22  CREATININE 0.78   < > 0.66 0.65 0.69 0.81 0.68  CALCIUM 8.2*   < > 8.1* 7.9* 7.8* 7.8* 7.9*  MG 2.1   < > 2.1 1.9 1.9 2.0 1.9  PHOS 3.9  --  3.0 3.1  3.7 4.0  --    < > = values in this interval not displayed.   GFR: Estimated Creatinine Clearance: 80.2 mL/min (by C-G formula based on SCr of 0.68 mg/dL). Liver Function Tests: Recent Labs  Lab 12/28/19 0135 12/29/19 0211 12/30/19 0257 12/31/19 0210 01/01/20 0201  AST 11* 8* 8* 9* 11*  ALT 8 8 8 7 8   ALKPHOS 71 70 67 61 67  BILITOT 0.8 0.8 1.4* 1.5* 1.7*  PROT 5.5* 5.5* 5.5* 5.5* 5.7*  ALBUMIN 2.7* 2.6* 2.5* 2.5* 2.4*   No results for input(s): LIPASE, AMYLASE in the last 168 hours. No results for input(s): AMMONIA in the last 168 hours. Coagulation Profile: No results for input(s): INR,  PROTIME in the last 168 hours. Cardiac Enzymes: No results for input(s): CKTOTAL, CKMB, CKMBINDEX, TROPONINI in the last 168 hours. BNP (last 3 results) No results for input(s): PROBNP in the last 8760 hours. HbA1C: No results for input(s): HGBA1C in the last 72 hours. CBG: Recent Labs  Lab 01/01/20 0826 01/01/20 1159 01/01/20 1632 01/01/20 2155 01/02/20 0822  GLUCAP 138* 246* 155* 170* 140*   Lipid Profile: No results for input(s): CHOL, HDL, LDLCALC, TRIG, CHOLHDL, LDLDIRECT in the last 72 hours. Thyroid Function Tests: No results for input(s): TSH, T4TOTAL, FREET4, T3FREE, THYROIDAB in the last 72 hours. Anemia Panel: No results for input(s): VITAMINB12, FOLATE, FERRITIN, TIBC, IRON, RETICCTPCT in the last 72 hours. Sepsis Labs: Recent Labs  Lab 01/02/20 0150  PROCALCITON <0.10    Recent Results (from the past 240 hour(s))  SARS Coronavirus 2 by RT PCR (hospital order, performed in [facility] hospital lab) Nasopharyngeal Nasopharyngeal Swab     Status: None   Collection Time: 12/24/19  7:29 AM   Specimen: Nasopharyngeal Swab  Result Value Ref Range Status   SARS Coronavirus 2 NEGATIVE NEGATIVE Final    Comment: (NOTE) SARS-CoV-2 target nucleic acids are NOT DETECTED.  The SARS-CoV-2 RNA is generally detectable in upper and lower respiratory specimens during the  acute phase of infection. The lowest concentration of SARS-CoV-2 viral copies this assay can detect is 250 copies / mL. A negative result does not preclude SARS-CoV-2 infection and should not be used as the sole basis for treatment or other patient management decisions.  A negative result may occur with improper specimen collection / handling, submission of specimen other than nasopharyngeal swab, presence of viral mutation(s) within the areas targeted by this assay, and inadequate number of viral copies (<250 copies / mL). A negative result must be combined with clinical observations, patient history, and epidemiological information.  Fact Sheet for Patients:   StrictlyIdeas.no  Fact Sheet for Healthcare Providers: BankingDealers.co.za  This test is not yet approved or  cleared by the [place] FDA and has been authorized for detection and/or diagnosis of SARS-CoV-2 by FDA under an Emergency Use Authorization (EUA).  This EUA will remain in effect (meaning this test can be used) for the duration of the COVID-19 declaration under Section 564(b)(1) of the Act, 21 U.S.C. section 360bbb-3(b)(1), unless the authorization is terminated or revoked sooner.  Performed at [facility], [place] [address] [place], [place] [address]   MRSA PCR Screening     Status: None   Collection Time: 12/24/19 11:05 AM   Specimen: Nasal Mucosa; Nasopharyngeal  Result Value Ref Range Status   MRSA by PCR NEGATIVE NEGATIVE Final    Comment:        The GeneXpert MRSA Assay (FDA approved for NASAL specimens only), is one component of a comprehensive MRSA colonization surveillance program. It is not intended to diagnose MRSA infection nor to guide or monitor treatment for MRSA infections. Performed at [facility], [place] [address], [place],  [address]          Radiology Studies: CT CHEST W  CONTRAST  Result Date: 12/31/2019 CLINICAL DATA:  Dyspnea. EXAM: CT CHEST WITH CONTRAST TECHNIQUE: Multidetector CT imaging of the chest was performed during intravenous contrast administration. CONTRAST:  20mL OMNIPAQUE IOHEXOL 300 MG/ML  SOLN COMPARISON:  Dec 06, 2019 FINDINGS: Cardiovascular: There is a dual lead AICD. There is dilatation of the right pulmonary artery (3.7 cm). Stable marked severity cardiomegaly is noted. No pericardial  effusion. 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 Marked severity coronary artery calcification is also seen. Mediastinum/Nodes: There is stable mild to moderate severity pretracheal and AP window lymphadenopathy. Lungs/Pleura: There is marked severity emphysematous lung disease. Stable mild to moderate severity areas of atelectasis and/or infiltrate are seen within the posterior aspects of the bilateral lower lobes. Stable bilateral moderate severity pleural effusions are seen with stable loculated components. There is no evidence of a pneumothorax. Upper Abdomen: Stable left renal cysts are noted. Musculoskeletal: Chronic right-sided rib deformities are seen with multilevel degenerative changes noted throughout the thoracic spine. IMPRESSION: 1. Stable mild to moderate severity posterior bilateral lower lobe atelectasis and/or infiltrate. 2. Stable bilateral moderate severity pleural effusions with stable loculated components. 3. Marked severity cardiomegaly. 4. Stable sinus of Valsalva aneurysm. 5. 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 6. Left renal cysts. 7. Emphysema. Emphysema (ICD10-J43.9). Electronically Signed   By: Virgina Norfolk M.D.   On: 12/31/2019 19:57        Scheduled Meds: . sodium chloride   Intravenous Once  . atorvastatin  10 mg Oral Daily  . bethanechol  10 mg Oral TID  . budesonide (PULMICORT) nebulizer solution  0.5 mg Nebulization BID  . Chlorhexidine Gluconate Cloth  6 each Topical Daily  . citalopram  10 mg Oral  Daily  . dextromethorphan-guaiFENesin  1 tablet Oral BID  . digoxin  0.125 mg Oral Daily  . feeding supplement  1 Container Oral BID BM  . furosemide  80 mg Oral BID  . insulin aspart  0-5 Units Subcutaneous QHS  . insulin aspart  0-9 Units Subcutaneous TID WC  . ipratropium-albuterol  3 mL Nebulization TID  . mouth rinse  15 mL Mouth Rinse BID  . pantoprazole  40 mg Oral Daily  . potassium chloride  30 mEq Oral Q4H  . tamsulosin  0.4 mg Oral QPC supper  . cyanocobalamin  1,000 mcg Oral Daily   Continuous Infusions:   LOS: 9 days   Time spent= 35 mins    Esteban Kobashigawa Arsenio Loader, MD [facility]  If 7PM-7AM, please contact night-coverage  01/02/2020, 11:32 AM

## 2020-01-02 NOTE — Progress Notes (Signed)
PMT brief progress note  Patient seen briefly, still on high O2 requirements, chart reviewed.   Discussed with med tech liaison who was here to de activate ICD as per patient request. Agree completely.   BP (!) 115/43   Pulse 73   Temp 97.9 F (36.6 C) (Oral)   Resp 20   Ht 6\' 1"  (1.854 m)   Wt 67.9 kg   SpO2 99%   BMI 19.75 kg/m  Labs and imaging noted.  Cardiac medications are being titrated as patient is able to tolerate.   A time limited trial of current interventions is going on, how ever, patient with high O2 requirements, tenuous overall condition with acute on chronic combined systolic and diastolic CHF with an EF of 25 to 30%, ICD now de activated, recent Lower GI bleed, recent concern for cecal mass, ongoing acute on chronic hypoxic resp failure, hence, residential hospice at time of discharge appears to be the most prudent option. PMT to follow. Wife not at bedside at time of visit.   Greater than 50%  of this time was spent counseling and coordinating care related to the above assessment and plan. 15 minutes spent [facility] palliative [phone]

## 2020-01-02 NOTE — Progress Notes (Signed)
Physical Therapy Treatment Patient Details Name: [name] MRN: [id number] DOB: 1946-02-22 Today's Date: 01/02/2020    History of Present Illness 74 yo male presented from rehab center with abdominal pain and maroon colored stool.  PCCM asked to admit to ICU.  Recent admission at [facility] where pt had CT abd/pelvis 12/06/19 that showed possible cecal mass. Pt DCed to [facility].   He is DNR/DNI.  Opted against any endoscopic procedures. PMH of CHF, COPD on 5L O2 baseline, a fib, lung cancer s/p wedge resection, DM2, HTN, HLD    PT Comments    The patient appears feeling a little better, patient on 25L/30% HHFNC. Patient  Did mobilize to recliner, very weak, requires 2 assist to stand and pivot. Continue PT for mobility.SPO2 dropped from 89 % to 79% while mobilizing.  Follow Up Recommendations  SNF     Equipment Recommendations  None recommended by PT    Recommendations for Other Services       Precautions / Restrictions Precautions Precautions: Fall;ICD/Pacemaker Precaution Comments: watch SPO2,on HHFNC, 25L/50%, bloody BM, defibrillator deactivated today    Mobility  Bed Mobility   Bed Mobility: Sidelying to Sit Rolling: Min assist Sidelying to sit: Mod assist       General bed mobility comments: multimodal cues for mob=ving legs to bed edge, assist with trunk  Transfers Overall transfer level: Needs assistance Equipment used: 2 person hand held assist Transfers: Sit to/from Stand;Stand Pivot Transfers Sit to Stand: Mod assist;+2 physical assistance;+2 safety/equipment Stand pivot transfers: +2 physical assistance;+2 safety/equipment;Mod assist       General transfer comment: steady assist to stand from bed, small shuffle steps to recliner on HHFNC, dropped to 79%, gradual return tom89%  Ambulation/Gait                 Stairs             Wheelchair Mobility    Modified Rankin (Stroke Patients Only)       Balance                                             Cognition Arousal/Alertness: Awake/alert Behavior During Therapy: Flat affect;WFL for tasks assessed/performed Overall Cognitive Status: Difficult to assess                                 General Comments: answers with few words,      Exercises      General Comments        Pertinent Vitals/Pain Faces Pain Scale: No hurt    Home Living                      Prior Function            PT Goals (current goals can now be found in the care plan section) Progress towards PT goals: Not progressing toward goals - comment (really is weaker, on higher O2 nrequirements, desats)    Frequency    Min 2X/week      PT Plan Current plan remains appropriate    Co-evaluation              AM-PAC PT "6 Clicks" Mobility   Outcome Measure  Help needed turning from your back to your side while in a flat bed  without using bedrails?: A Lot Help needed moving from lying on your back to sitting on the side of a flat bed without using bedrails?: A Lot Help needed moving to and from a bed to a chair (including a wheelchair)?: A Lot Help needed standing up from a chair using your arms (e.g., wheelchair or bedside chair)?: A Lot Help needed to walk in hospital room?: A Lot Help needed climbing 3-5 steps with a railing? : Total 6 Click Score: 11    End of Session Equipment Utilized During Treatment: Oxygen;Gait belt Activity Tolerance: Treatment limited secondary to medical complications (Comment) Patient left: with call bell/phone within reach;in chair;with nursing/sitter in room Nurse Communication: Mobility status PT Visit Diagnosis: Other abnormalities of gait and mobility (R26.89);Difficulty in walking, not elsewhere classified (R26.2)     Time: 1661-9694 PT Time Calculation (min) (ACUTE ONLY): 24 min  Charges:  $Therapeutic Activity: 8-22 mins                    Tresa Endo PT Acute Rehabilitation Services Pager  [phone] Office [phone]   Claretha Cooper 01/02/2020, 12:46 PM

## 2020-01-02 NOTE — Progress Notes (Addendum)
Progress Note  Patient Name: [name] Date of Encounter: 01/02/2020  Primary Cardiologist: Candee Furbish, MD  Subjective   Feeling "about the same." Still SOB, dizzy at times, nauseated. No CP.  Inpatient Medications    Scheduled Meds: . sodium chloride   Intravenous Once  . atorvastatin  10 mg Oral Daily  . bethanechol  10 mg Oral TID  . budesonide (PULMICORT) nebulizer solution  0.5 mg Nebulization BID  . Chlorhexidine Gluconate Cloth  6 each Topical Daily  . citalopram  10 mg Oral Daily  . dextromethorphan-guaiFENesin  1 tablet Oral BID  . digoxin  0.125 mg Oral Daily  . feeding supplement  1 Container Oral BID BM  . furosemide  80 mg Oral BID  . insulin aspart  0-5 Units Subcutaneous QHS  . insulin aspart  0-9 Units Subcutaneous TID WC  . ipratropium-albuterol  3 mL Nebulization TID  . mouth rinse  15 mL Mouth Rinse BID  . pantoprazole  40 mg Oral Daily  . potassium chloride  30 mEq Oral Q4H  . tamsulosin  0.4 mg Oral QPC supper  . cyanocobalamin  1,000 mcg Oral Daily   Continuous Infusions:  PRN Meds: albuterol, ondansetron (ZOFRAN) IV, oxyCODONE-acetaminophen, polyethylene glycol, senna-docusate   Vital Signs    Vitals:   01/02/20 0453 01/02/20 0600 01/02/20 0843 01/02/20 0845  BP:  (!) 107/44    Pulse:  81    Resp:  (!) 21    Temp:   97.9 F (36.6 C)   TempSrc:   Oral   SpO2:   (!) 89% 91%  Weight: 67.9 kg     Height:        Intake/Output Summary (Last 24 hours) at 01/02/2020 0908 Last data filed at 01/02/2020 0426 Gross per 24 hour  Intake 240 ml  Output 1850 ml  Net -1610 ml   Last 3 Weights 01/02/2020 01/01/2020 12/31/2019  Weight (lbs) 149 lb 11.1 oz 156 lb 8.4 oz 156 lb 8.4 oz  Weight (kg) 67.9 kg 71 kg 71 kg     Telemetry    Paced rhythm, frequent PVCs, couplets, occasional bigeminy - Personally Reviewed  Physical Exam   GEN: Chronically ill appearing WM with sallow complexion HEENT: Normocephalic, atraumatic, sclera  non-icteric. Neck: No JVD or bruits. Cardiac: RRR no murmurs, rubs, or gallops.  Radials/DP/PT 1+ and equal bilaterally.  Respiratory: Diffusely coarse decreased BS throughout. Breathing is unlabored but O2 sat hovering 88% on supplementation. GI: Soft, nontender, non-distended, BS +x 4. MS: no deformity. Extremities: No clubbing or cyanosis. No edema. Distal pedal pulses are 2+ and equal bilaterally. Neuro:  AAOx3. Follows commands. Psych:  Responds to questions appropriately with a normal affect.  Labs    High Sensitivity Troponin:   Recent Labs  Lab 12/06/19 1824 12/06/19 2140  TROPONINIHS 19* 19*      Cardiac EnzymesNo results for input(s): TROPONINI in the last 168 hours. No results for input(s): TROPIPOC in the last 168 hours.   Chemistry Recent Labs  Lab 12/30/19 0257 12/30/19 0257 12/31/19 0210 01/01/20 0201 01/02/20 0150  NA 135   < > 135 133* 132*  K 4.7   < > 3.9 3.8 3.4*  CL 95*   < > 93* 91* 92*  CO2 33*   < > 31 31 31   GLUCOSE 148*   < > 126* 161* 168*  BUN 21   < > 23 25* 22  CREATININE 0.65   < > 0.69 0.81 0.68  CALCIUM 7.9*   < > 7.8* 7.8* 7.9*  PROT 5.5*  --  5.5* 5.7*  --   ALBUMIN 2.5*  --  2.5* 2.4*  --   AST 8*  --  9* 11*  --   ALT 8  --  7 8  --   ALKPHOS 67  --  61 67  --   BILITOT 1.4*  --  1.5* 1.7*  --   GFRNONAA >60   < > >60 >60 >60  GFRAA >60   < > >60 >60 >60  ANIONGAP 7   < > 11 11 9    < > = values in this interval not displayed.     Hematology Recent Labs  Lab 12/31/19 0210 01/01/20 0201 01/02/20 0150  WBC 10.1 9.7 10.1  RBC 3.02* 3.06* 2.89*  HGB 8.0* 8.0* 7.6*  HCT 26.0* 26.4* 25.0*  MCV 86.1 86.3 86.5  MCH 26.5 26.1 26.3  MCHC 30.8 30.3 30.4  RDW 21.5* 21.5* 21.2*  PLT 242 263 261    BNP Recent Labs  Lab 12/29/19 0211 01/02/20 0150  BNP 1,473.9* 673.2*     DDimer No results for input(s): DDIMER in the last 168 hours.   Radiology    CT CHEST W CONTRAST  Result Date: 12/31/2019 CLINICAL DATA:   Dyspnea. EXAM: CT CHEST WITH CONTRAST TECHNIQUE: Multidetector CT imaging of the chest was performed during intravenous contrast administration. CONTRAST:  23mL OMNIPAQUE IOHEXOL 300 MG/ML  SOLN COMPARISON:  Dec 06, 2019 FINDINGS: Cardiovascular: There is a dual lead AICD. There is dilatation of the right pulmonary artery (3.7 cm). Stable marked severity cardiomegaly is noted. No pericardial effusion. 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 Marked severity coronary artery calcification is also seen. Mediastinum/Nodes: There is stable mild to moderate severity pretracheal and AP window lymphadenopathy. Lungs/Pleura: There is marked severity emphysematous lung disease. Stable mild to moderate severity areas of atelectasis and/or infiltrate are seen within the posterior aspects of the bilateral lower lobes. Stable bilateral moderate severity pleural effusions are seen with stable loculated components. There is no evidence of a pneumothorax. Upper Abdomen: Stable left renal cysts are noted. Musculoskeletal: Chronic right-sided rib deformities are seen with multilevel degenerative changes noted throughout the thoracic spine. IMPRESSION: 1. Stable mild to moderate severity posterior bilateral lower lobe atelectasis and/or infiltrate. 2. Stable bilateral moderate severity pleural effusions with stable loculated components. 3. Marked severity cardiomegaly. 4. Stable sinus of Valsalva aneurysm. 5. 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 6. Left renal cysts. 7. Emphysema. Emphysema (ICD10-J43.9). Electronically Signed   By: Virgina Norfolk M.D.   On: 12/31/2019 19:57    Cardiac Studies   2D echo 11/14/19 1. LVEF is severely depressed with diffuse hypokinesis, worse in the  septal and apical walls Compared to report from Nov 2020 echo, LVEF is  worse (apical views foreshortened in 2020 exam which makes assessment  difficult). Left ventricular ejection  fraction, by estimation, is 25 to 30%.  The left ventricle has severely  decreased function. The left ventricle demonstrates global hypokinesis.  There is mild left ventricular hypertrophy. Left ventricular diastolic  parameters are indeterminate.  2. Right ventricular systolic function is moderately reduced. The right  ventricular size is severely enlarged. There is moderately elevated  pulmonary artery systolic pressure.  3. Left atrial size was severely dilated.  4. Right atrial size was severely dilated.  5. A 33 mm [employer] Mosai bioprosthesisiis present Peak and mean  gradients through the vave  are 15 and 6 mm Hg respectively, both lower  than previous echo report in 2020.Marland Kitchen The mitral valve has been  repaired/replaced. Trivial mitral valve regurgitation.  6. The aortic valve is abnormal. Aortic valve regurgitation is not  visualized. Mild to moderate aortic valve sclerosis/calcification is  present, without any evidence of aortic stenosis.  7. Aortic root is 51 mm Previous echo measured echo above this; current  exam did not image that high.. Aortic dilatation noted. Aneurysm of the  aortic root. There is moderate to severe dilatation of the aortic root  measuring 51 mm.  8. The inferior vena cava is dilated in size with <50% respiratory  variability, suggesting right atrial pressure of 15 mmHg.   Patient Profile     73 y.o. 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 Has recently  been followed in outpatient setting for palliative care discussions. He presented 12/24/19 with abdominal pain and maroon colored stool with anemia. CT abdomen pelvis on 12/06/2019 that showed possible cecal mass. Patient declined endoscopic procedures, elected to pursue conservative management. Also developed acute on chronic respiratory failure requiring above-normal O2 supplementation. Was receiving IV fluids initially which were discontinued 6/19. Cardiology consulted 12/29/19 for CHF. Other issues include hyponatremia, hypokalemia, hypoalbuminemia with abnormal LFTs.  Assessment & Plan    1. Acute on chronic combined CHF - agree that patient does not appear overtly volume overloaded at present time. Admit weight listed as 136, peak weight 158.9, today's weight 149.7. BP remains soft, continue oral Lasix if BP tolerates, CHF likely end stage at this point and given his comorbidities he is not a candidate for advanced therapies. He is also on digoxin. He is a DNR but do not see prior discussion in place about ICD function of his device - this will shock him in the event of a VT/VF arrest. I discussed the mechanism of action with the patient and options with him. He would like defibrillator therapies de-activated. I have notified cardmaster to get industry rep to assist. His Bi-V pacemaker capabilities will continue; the device will just no longer deliver resuscitative shock in the event of sustained ventricular arrhythmia.  2. NSVT/PVCs - BP has been too soft for beta blocker therapy this admission. Can reconsider if BP comes consistently up. Keep K 4.0 or greater and Mg 2.0 or greater. KCl repletion ordered this AM.  3. H/o permanent atrial fib s/p LAA clipping and AVN ablation with BiV-ICD in place - not on anticoag due to h/o ICH. See above re: ICD.  4. Hypokalemia - repletion already ordered.  5. Continued acute on chronic hypoxic respiratory failure, cecal mass of undetermined etiology (patient  declines endoscopic workup) - per primary team, palliative care. O2 sats remain borderline this morning. Prognosis poor.  For questions or updates, please contact [facility] Please consult [email or web address] for contact info under Cardiology/STEMI.  Signed, Charlie Pitter, PA-C 01/02/2020, 9:08 AM

## 2020-01-03 DIAGNOSIS — R531 Weakness: Secondary | ICD-10-CM

## 2020-01-03 DIAGNOSIS — I482 Chronic atrial fibrillation, unspecified: Secondary | ICD-10-CM

## 2020-01-03 LAB — CBC
HCT: 26.1 % — ABNORMAL LOW (ref 39.0–52.0)
Hemoglobin: 7.8 g/dL — ABNORMAL LOW (ref 13.0–17.0)
MCH: 25.4 pg — ABNORMAL LOW (ref 26.0–34.0)
MCHC: 29.9 g/dL — ABNORMAL LOW (ref 30.0–36.0)
MCV: 85 fL (ref 80.0–100.0)
Platelets: 248 10*3/uL (ref 150–400)
RBC: 3.07 MIL/uL — ABNORMAL LOW (ref 4.22–5.81)
RDW: 21 % — ABNORMAL HIGH (ref 11.5–15.5)
WBC: 10.2 10*3/uL (ref 4.0–10.5)
nRBC: 0.2 % (ref 0.0–0.2)

## 2020-01-03 LAB — BASIC METABOLIC PANEL
Anion gap: 8 (ref 5–15)
BUN: 23 mg/dL (ref 8–23)
CO2: 32 mmol/L (ref 22–32)
Calcium: 7.9 mg/dL — ABNORMAL LOW (ref 8.9–10.3)
Chloride: 92 mmol/L — ABNORMAL LOW (ref 98–111)
Creatinine, Ser: 0.75 mg/dL (ref 0.61–1.24)
GFR calc Af Amer: >60 mL/min (ref >60)
GFR calc non Af Amer: >60 mL/min (ref >60)
Glucose, Bld: 176 mg/dL — ABNORMAL HIGH (ref 70–99)
Potassium: 4.1 mmol/L (ref 3.5–5.1)
Sodium: 132 mmol/L — ABNORMAL LOW (ref 135–145)

## 2020-01-03 LAB — GLUCOSE, CAPILLARY
Glucose-Capillary: 146 mg/dL — ABNORMAL HIGH (ref 70–99)
Glucose-Capillary: 186 mg/dL — ABNORMAL HIGH (ref 70–99)
Glucose-Capillary: 146 mg/dL — ABNORMAL HIGH (ref 70–99)
Glucose-Capillary: 197 mg/dL — ABNORMAL HIGH (ref 70–99)

## 2020-01-03 LAB — MAGNESIUM: Magnesium: 2.1 mg/dL (ref 1.7–2.4)

## 2020-01-03 NOTE — Progress Notes (Signed)
Daily Progress Note   Patient Name: [name]       Date: 01/03/2020 DOB: [birth year]  Age: 74 y.o. MRN#: [id number] Attending Physician: Damita Lack, MD Primary Care Physician: [name], NP Admit Date: 12/24/2019  Reason for Consultation/Follow-up: Establishing goals of care  Subjective: Awake alert, sitting in a chair, continues to require high O2, worked with PT some this am. I discussed with him about goals of care, next steps and disposition options see below.    Length of Stay: 10  Current Medications: Scheduled Meds:  . sodium chloride   Intravenous Once  . bethanechol  10 mg Oral TID  . budesonide (PULMICORT) nebulizer solution  0.5 mg Nebulization BID  . Chlorhexidine Gluconate Cloth  6 each Topical Daily  . citalopram  10 mg Oral Daily  . dextromethorphan-guaiFENesin  1 tablet Oral BID  . digoxin  0.125 mg Oral Daily  . feeding supplement  1 Container Oral BID BM  . furosemide  80 mg Oral BID  . insulin aspart  0-5 Units Subcutaneous QHS  . insulin aspart  0-9 Units Subcutaneous TID WC  . ipratropium-albuterol  3 mL Nebulization TID  . mouth rinse  15 mL Mouth Rinse BID  . pantoprazole  40 mg Oral Daily  . tamsulosin  0.4 mg Oral QPC supper  . cyanocobalamin  1,000 mcg Oral Daily    Continuous Infusions:   PRN Meds: albuterol, ondansetron (ZOFRAN) IV, oxyCODONE-acetaminophen, polyethylene glycol, senna-docusate  Physical Exam         General: Alert, awake, in no acute distress. On high flow nasal cannula   Heart: Regular rate and rhythm. No murmur appreciated. Lungs: Decreased air movement, scattered crackles Abdomen: Soft, nontender, nondistended, positive bowel sounds.  Ext: Trace edema Skin: Warm and dry Neuro: Grossly intact,  nonfocal.   Vital Signs: BP (!) 110/49 (BP Location: Right Arm)   Pulse 73   Temp 98.1 F (36.7 C) (Oral)   Resp 18   Ht 6\' 1"  (1.854 m)   Wt 68.3 kg   SpO2 91%   BMI 19.87 kg/m  SpO2: SpO2: 91 % O2 Device: O2 Device: High Flow Nasal Cannula O2 Flow Rate: O2 Flow Rate (L/min): 20 L/min  Intake/output summary:   Intake/Output Summary (Last 24 hours) at 01/03/2020 1211 Last data filed at  01/03/2020 0406 Gross per 24 hour  Intake --  Output 2150 ml  Net -2150 ml   LBM: Last BM Date: 01/02/20 Baseline Weight: Weight: 61.7 kg Most recent weight: Weight: 68.3 kg       Palliative Assessment/Data:      Patient Active Problem List   Diagnosis Date Noted  . Generalized pain   . General weakness   . Shortness of breath   . Hemorrhagic shock ([place])   . SOB (shortness of breath)   . Hypoxia   . Protein-calorie malnutrition, severe 12/25/2019  . Gastrointestinal hemorrhage 12/24/2019  . Acute blood loss anemia   . Pressure ulcer 12/08/2019  . Depression, recurrent ([place])   . Acute respiratory failure with hypoxia ([place])   . Goals of care, counseling/discussion   . Palliative care by specialist   . Acute exacerbation of CHF (congestive heart failure) ([place]) 11/13/2019  . Elevated troponin   . Chronic atrial fibrillation ([place])   . Weakness generalized   . DNR (do not resuscitate)   . Acute on chronic combined systolic (congestive) and diastolic (congestive) heart failure ([place]) 04/27/2019  . COPD with acute exacerbation ([place]) 08/01/2018  . Pulmonary edema 07/30/2018  . Health care maintenance 05/09/2018  . Gastritis and gastroduodenitis   . Diverticulum of duodenum   . Aortic root dilatation ([place])   . 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 Atherosclerosis of aorta ([place]) 08/11/2016  . Orthostatic hypotension 09/26/2015  . Near syncope 09/24/2015  . Syncope 09/24/2015  . Hepatic cirrhosis ([place]) 09/25/2014  . History of colonic polyps 09/25/2014  . Serum total  bilirubin elevated 06/30/2014  . Follow-up examination, following unspecified surgery 01/27/2014  . Lung cancer ([place]) 11/29/2013  . Prosthetic valve dysfunction   . Chronic combined systolic and diastolic CHF (congestive heart failure) ([place])   . Type 2 diabetes mellitus ([place]) 11/04/2013  . Myocardial infarction ([place]) 11/01/2013  . Cardiomyopathy, nonischemic ([place]) 11/01/2013  . Acute on chronic respiratory failure with hypoxia ([place]) 08/15/2013  . H/O intracranial hemorrhage 08/13/2013  . H/O endocarditis 08/13/2013  . H/O: CVA (cerebrovascular accident) 08/13/2013  . Community acquired pneumonia 08/13/2013  . Chronic respiratory failure assoc with chf/ PAH 08/12/2013  . H/O atrioventricular nodal ablation   . S/P MVR (mitral valve replacement)   . Hypertension associated with diabetes ([place])   . Hyperlipidemia associated with type 2 diabetes mellitus ([place])   . Permanent atrial fibrillation   . Ejection fraction < 50%   . ICD (implantable cardioverter-defibrillator), biventricular, in situ   . Renal artery stenosis ([place])   . Pulmonary hypertension ([place])   . Primary cancer of left upper lobe of lung ([place]) 04/20/2009  . COLONIC POLYPS, ADENOMATOUS 03/22/2007  . COPD GOLD II 01/11/2007  . GERD 01/11/2007    Palliative Care Assessment & Plan   Patient Profile: 74 y.o.malewith past medical history of combined CHF with EF 25%, status post AICD, severe COPD/emphysema, chronic respiratory failure on 5 L at skilled facility, permanent A. fib on ASA, intercranial hemorrhage, mitral regurg status post bioprosthetic valve replacement, non-small cell lung cancer status post wedge resection, DM type II, hypertension, hyperlipidemiaadmitted on 6/15/2021with abdominal pain and maroon-colored stool. Of note he recently had CT of abdomen pelvis that showed possible cecal mass. He has declined consideration for colonoscopy as he been told in the past he would be high risk for any sort of sedating  procedure.  Also, he has had increasing oxygen needs and is wearing 12 L nasal cannula as  well as intermittent nonrebreather which is up from his baseline of 5 L nasal cannula outside of the hospital.Palliative consulted for goals of care.  Recommendations/Plan:  DNR/DNI  Continue as needed oxycodone/acetaminophen 5/325.    for pain and can also use if needed for shortness of breath.   Continue current interventions.    Patient remains aware of the serious nature of his condition, including his high O2 requirements, ongoing functional decline and frailty. Hence, I discussed with him about the concept of comfort measures and residential hospice. Patient has asked for his ICD to be de activated and this has been done. Patient states that he would like to discuss this with his wife and daughter, how ever, patient's wife is having cataract surgery today, as per patient. PMT to follow.    Code Status:    Code Status Orders  (From admission, onward)         Start     Ordered   12/24/19 0843  Do not attempt resuscitation (DNR)  Continuous       Question Answer Comment  In the event of cardiac or respiratory ARREST Do not call a "code blue"   In the event of cardiac or respiratory ARREST Do not perform Intubation, CPR, defibrillation or ACLS   In the event of cardiac or respiratory ARREST Use medication by any route, position, wound care, and other measures to relive pain and suffering. May use oxygen, suction and manual treatment of airway obstruction as needed for comfort.      12/24/19 0842        Code Status History    Date Active Date Inactive Code Status Order ID Comments User Context   12/17/2019 1137 12/24/2019 0515 DNR [id number]  Gayland Curry, DO Outpatient   12/06/2019 2335 12/13/2019 0055 DNR [id number]  Lenore Cordia, MD ED   11/13/2019 2118 11/25/2019 1857 DNR [id number]  Truddie Hidden, MD ED   11/13/2019 2114 11/13/2019 2118 Full Code [id number]  Truddie Hidden, MD ED   06/07/2019  1321 06/10/2019 2108 DNR [id number]  Karmen Bongo, MD ED   04/27/2019 1937 05/02/2019 1915 DNR [id number]  Lenore Cordia, MD ED   07/30/2018 1722 08/02/2018 1814 DNR [id number]  Cristal Deer, MD Inpatient   09/24/2015 1730 09/27/2015 1751 Full Code [id number]  Kelvin Cellar, MD Inpatient   11/29/2013 1523 12/03/2013 1401 Full Code [id number]  [name], MD Inpatient   08/12/2013 1804 08/18/2013 1820 Full Code [id number]  Barrett, Evelene Croon, PA-C Inpatient   Advance Care Planning Activity    Advance Directive Documentation     Most Recent Value  Type of Advance Directive Out of facility DNR (pink MOST or yellow form)  Pre-existing out of facility DNR order (yellow form or pink MOST form) Pink MOST form placed in chart (order not valid for inpatient use)  "MOST" Form in Place? --       Prognosis:  Guarded  Discharge Planning:  To Be Determined  Care plan was discussed with patient.   Thank you for allowing the Palliative Medicine Team to assist in the care of this patient.   Time In: 11 Time Out: 11.25 Total Time 25 Prolonged Time Billed No      Greater than 50%  of this time was spent counseling and coordinating care related to the above assessment and plan.  Loistine Chance, MD  Please contact Palliative Medicine Team phone at [phone] for questions and concerns.

## 2020-01-03 NOTE — Progress Notes (Signed)
PROGRESS NOTE    [name]  JIR:[id number] DOB: 01-Oct-1945 DOA: 12/24/2019 PCP: [name], NP   Brief Narrative:  74 year old with history of CHF EF 25% status post BiV ICD, severe COPD, respiratory failure, permanent A. fib status post AV node ablation, ICH, bioprosthetic MVR, DM2, NSCLC s/p resection HTN admitted for GI bleed to have cecal mass complicated by CHF exacerbation. CTA abd= neg for GI bleed. Family doesn't want EGD or C scope. Currently PRN transfusions and lasix. Palliative team following for now.   Assessment & Plan:   Principal Problem:   SOB (shortness of breath) Active Problems:   Lung cancer (HCC)   Chronic atrial fibrillation (HCC)   Acute exacerbation of CHF (congestive heart failure) (HCC)   Pressure ulcer   Gastrointestinal hemorrhage   Acute blood loss anemia   Protein-calorie malnutrition, severe   Hemorrhagic shock (HCC)   Hypoxia   Generalized pain   General weakness   Shortness of breath  Acute on chronic combined systolic and diastolic CHF with an EF of 25 to 30% status post AICD and pacemaker CAD Permanent A fib,  WHO group 2 and 3 pulmonary HTN HLD. -Not much improvement in his breathing.  Currently rate controlled.  Not on anticoagulation due to bleeding risk and history of ICH P.o. Lasix 80 mg today and tomorrow thereafter daily, monitor urine output.  Replete electrolytes as necessary Plans to deactivate defibrillator function, continue BiV pacing. BiPAP as necessary. CT chest shows bilateral pleural effusions.  Lower GI bleeding with concern for cecal mass noted on CT abd/pelvis 12/06/19. Does not want any endoscopic evaluation at this time.  Palliative care team following.  Transfuse as necessary  Suprapubic pain Resolved  Acute on chronic hypoxic respiratory failure, worsening and on heated high flow nasal cannula secondary to severe COPD/emphysema History of non-small cell lung cancer status post VATS Continues to be short  of breath, minimal improvement.  Continue aggressive scheduled and as needed bronchodilators.  I-S/flutter  DM type II poorly controlled with hyperglycemia. Metformin on hold currently on insulin sliding scale and Accu-Chek.  Hx of Depression. Citalopram 10 mg daily  BPH, urine retention. Bethanechol 10 mg po TID, Tamsuolsin 0.4 po Daily  Deconditioning. PT/OT once medically stable  History of alcoholic cirrhosis Supportive care.  Overall appears to be compensated from a hepatic standpoint  Goals of care. Palliative care team following, currently patient is DNR.  Continue management as planned above with as needed BiPAP.  Stage 1 Coccyx Ulcer, pOA -Continue Wound Care  Overall poor prognosis, patient understands this.  He is very poor/minimal pulmonary reserve now complicated by CHF exacerbation, and pleural effusions  DVT prophylaxis: Place and maintain sequential compression device Start: 12/24/19 0843  Code Status: DNR Family Communication: Tammy Updated  Status is: Inpatient  Remains inpatient appropriate because:Hemodynamically unstable   Dispo: The patient is from: Home              Anticipated d/c is to: To be determined              Anticipated d/c date is: > 3 days              Patient currently is not medically stable to d/c.  Still quite hypoxic on heated high flow  Subjective: Still short of breath. Minimal improvement.   Review of Systems Otherwise negative except as per HPI, including: General = no fevers, chills, dizziness,  fatigue HEENT/EYES = negative for loss of vision, double vision, blurred  vision,  sore throa Cardiovascular= negative for chest pain, palpitation Respiratory/lungs= negative for shortness of breath, cough, wheezing; hemoptysis,  Gastrointestinal= negative for nausea, vomiting, abdominal pain Genitourinary= negative for Dysuria MSK = Negative for arthralgia, myalgias Neurology= Negative for headache, numbness, tingling    Psychiatry= Negative for suicidal and homocidal ideation Skin= Negative for Rash  Examination: Constitutional: remains heated high flow, comfortable at rest. Appears frail without bilateral temp wasting.  Respiratory: b/l Coarse BS Cardiovascular: Normal sinus rhythm, no rubs Abdomen: Nontender nondistended good bowel sounds Musculoskeletal: No edema noted Skin: No rashes seen Neurologic: CN 2-12 grossly intact.  And nonfocal Psychiatric: Normal judgment and insight. Alert and oriented x 3. Normal mood.     Objective: Vitals:   01/03/20 0700 01/03/20 0800 01/03/20 0802 01/03/20 0900  BP:  (!) 110/49    Pulse: 78 79  73  Resp: 15 (!) 22  18  Temp:  98.1 F (36.7 C)    TempSrc:  Oral    SpO2:  96% 91% (!) 87%  Weight:      Height:        Intake/Output Summary (Last 24 hours) at 01/03/2020 1026 Last data filed at 01/03/2020 0406 Gross per 24 hour  Intake --  Output 2150 ml  Net -2150 ml   Filed Weights   01/01/20 0500 01/02/20 0453 01/03/20 0406  Weight: 71 kg 67.9 kg 68.3 kg     Data Reviewed:   CBC: Recent Labs  Lab 12/28/19 0135 12/28/19 1035 12/29/19 0211 12/29/19 0211 12/30/19 0257 12/31/19 0210 01/01/20 0201 01/02/20 0150 01/03/20 0217  WBC 10.5   < > 10.6*   < > 10.2 10.1 9.7 10.1 10.2  NEUTROABS 8.6*  --  8.6*  --  7.9* 8.0* 7.9*  --   --   HGB 7.2*   < > 7.7*   < > 8.2* 8.0* 8.0* 7.6* 7.8*  HCT 24.3*   < > 26.1*   < > 26.8* 26.0* 26.4* 25.0* 26.1*  MCV 88.4   < > 89.4   < > 87.3 86.1 86.3 86.5 85.0  PLT 191   < > 207   < > 233 242 263 261 248   < > = values in this interval not displayed.   Basic Metabolic Panel: Recent Labs  Lab 12/28/19 0135 12/28/19 0135 12/29/19 0211 12/29/19 0211 12/30/19 0257 12/31/19 0210 01/01/20 0201 01/02/20 0150 01/03/20 0217  NA 133*   < > 133*   < > 135 135 133* 132* 132*  K 4.6   < > 4.9   < > 4.7 3.9 3.8 3.4* 4.1  CL 99   < > 94*   < > 95* 93* 91* 92* 92*  CO2 30   < > 30   < > 33* 31 31 31  32   GLUCOSE 170*   < > 210*   < > 148* 126* 161* 168* 176*  BUN 28*   < > 27*   < > 21 23 25* 22 23  CREATININE 0.78   < > 0.66   < > 0.65 0.69 0.81 0.68 0.75  CALCIUM 8.2*   < > 8.1*   < > 7.9* 7.8* 7.8* 7.9* 7.9*  MG 2.1   < > 2.1   < > 1.9 1.9 2.0 1.9 2.1  PHOS 3.9  --  3.0  --  3.1 3.7 4.0  --   --    < > = values in this interval not displayed.   GFR:  Estimated Creatinine Clearance: 80.6 mL/min (by C-G formula based on SCr of 0.75 mg/dL). Liver Function Tests: Recent Labs  Lab 12/28/19 0135 12/29/19 0211 12/30/19 0257 12/31/19 0210 01/01/20 0201  AST 11* 8* 8* 9* 11*  ALT 8 8 8 7 8   ALKPHOS 71 70 67 61 67  BILITOT 0.8 0.8 1.4* 1.5* 1.7*  PROT 5.5* 5.5* 5.5* 5.5* 5.7*  ALBUMIN 2.7* 2.6* 2.5* 2.5* 2.4*   No results for input(s): LIPASE, AMYLASE in the last 168 hours. No results for input(s): AMMONIA in the last 168 hours. Coagulation Profile: No results for input(s): INR, PROTIME in the last 168 hours. Cardiac Enzymes: No results for input(s): CKTOTAL, CKMB, CKMBINDEX, TROPONINI in the last 168 hours. BNP (last 3 results) No results for input(s): PROBNP in the last 8760 hours. HbA1C: No results for input(s): HGBA1C in the last 72 hours. CBG: Recent Labs  Lab 01/02/20 0822 01/02/20 1312 01/02/20 1629 01/02/20 2136 01/03/20 0733  GLUCAP 140* 238* 136* 204* 146*   Lipid Profile: No results for input(s): CHOL, HDL, LDLCALC, TRIG, CHOLHDL, LDLDIRECT in the last 72 hours. Thyroid Function Tests: No results for input(s): TSH, T4TOTAL, FREET4, T3FREE, THYROIDAB in the last 72 hours. Anemia Panel: No results for input(s): VITAMINB12, FOLATE, FERRITIN, TIBC, IRON, RETICCTPCT in the last 72 hours. Sepsis Labs: Recent Labs  Lab 01/02/20 0150  PROCALCITON <0.10    Recent Results (from the past 240 hour(s))  MRSA PCR Screening     Status: None   Collection Time: 12/24/19 11:05 AM   Specimen: Nasal Mucosa; Nasopharyngeal  Result Value Ref Range Status   MRSA by PCR  NEGATIVE NEGATIVE Final    Comment:        The GeneXpert MRSA Assay (FDA approved for NASAL specimens only), is one component of a comprehensive MRSA colonization surveillance program. It is not intended to diagnose MRSA infection nor to guide or monitor treatment for MRSA infections. Performed at [facility], [place] [address], [place], [place] [address]          Radiology Studies: No results found.      Scheduled Meds: . sodium chloride   Intravenous Once  . bethanechol  10 mg Oral TID  . budesonide (PULMICORT) nebulizer solution  0.5 mg Nebulization BID  . Chlorhexidine Gluconate Cloth  6 each Topical Daily  . citalopram  10 mg Oral Daily  . dextromethorphan-guaiFENesin  1 tablet Oral BID  . digoxin  0.125 mg Oral Daily  . feeding supplement  1 Container Oral BID BM  . furosemide  80 mg Oral BID  . insulin aspart  0-5 Units Subcutaneous QHS  . insulin aspart  0-9 Units Subcutaneous TID WC  . ipratropium-albuterol  3 mL Nebulization TID  . mouth rinse  15 mL Mouth Rinse BID  . pantoprazole  40 mg Oral Daily  . tamsulosin  0.4 mg Oral QPC supper  . cyanocobalamin  1,000 mcg Oral Daily   Continuous Infusions:   LOS: 10 days   Time spent= 35 mins    Daven Pinckney Arsenio Loader, MD [facility]  If 7PM-7AM, please contact night-coverage  01/03/2020, 10:26 AM

## 2020-01-03 NOTE — Progress Notes (Signed)
Physical Therapy Treatment Patient Details Name: [name] MRN: [id number] DOB: 12-Aug-1945 Today's Date: 01/03/2020    History of Present Illness 74 yo male presented from rehab center with abdominal pain and maroon colored stool.  PCCM asked to admit to ICU.  Recent admission at [facility] where pt had CT abd/pelvis 12/06/19 that showed possible cecal mass. Pt DCed to [facility].   He is DNR/DNI.  Opted against any endoscopic procedures. PMH of CHF, COPD on 5L O2 baseline, a fib, lung cancer s/p wedge resection, DM2, HTN, HLD    PT Comments    The patient  Assisted to [facility], very unsteady, then to recliner. Patient on 30L/40% HHFNC. Patient noted to  Drop to 70's? When mobilizing. At rest 85-95%. Continue mobility with PT as tolerated.    Follow Up Recommendations  SNF     Equipment Recommendations  None recommended by PT    Recommendations for Other Services       Precautions / Restrictions Precautions Precautions: Fall Precaution Comments: HFNC 30L 40%, watch sats, frequent BM's Restrictions Weight Bearing Restrictions: No    Mobility  Bed Mobility     Rolling: Min assist Sidelying to sit: Mod assist       General bed mobility comments: Patient assisted with transfer to side of the bed - pulling on bed rails.  Transfers Overall transfer level: Needs assistance Equipment used: Rolling walker (2 wheeled) Transfers: Sit to/from [facility] Sit to Stand: Mod assist;+2 physical assistance;+2 safety/equipment Stand pivot transfers: +2 physical assistance;+2 safety/equipment;Mod assist       General transfer comment: Stood at side of bed holding onto walker for pericare. Transferred to [facility]. Again stood for pericare/toileting clean up with total assist. steps to recliner, very unsteady.  Ambulation/Gait                 Stairs             Wheelchair Mobility    Modified Rankin (Stroke Patients Only)       Balance Overall balance  assessment: Needs assistance Sitting-balance support: Feet supported;No upper extremity supported Sitting balance-Leahy Scale: Fair     Standing balance support: Bilateral upper extremity supported;During functional activity Standing balance-Leahy Scale: Poor Standing balance comment: BUE on walker with standing.                            Cognition Arousal/Alertness: Awake/alert Behavior During Therapy: Flat affect Overall Cognitive Status: Difficult to assess                                 General Comments: answers with few words, minimal engagement      Exercises      General Comments        Pertinent Vitals/Pain Pain Assessment: Faces Faces Pain Scale: Hurts even more Pain Location: rectume/buttocks Pain Descriptors / Indicators: Grimacing Pain Intervention(s): Monitored during session;Limited activity within patient's tolerance    Home Living                      Prior Function            PT Goals (current goals can now be found in the care plan section) Progress towards PT goals: Progressing toward goals    Frequency    Min 2X/week      PT Plan Current plan remains appropriate  Co-evaluation PT/OT/SLP Co-Evaluation/Treatment: Yes Reason for Co-Treatment: For patient/therapist safety;To address functional/ADL transfers PT goals addressed during session: Mobility/safety with mobility OT goals addressed during session: ADL's and self-care      AM-PAC PT "6 Clicks" Mobility   Outcome Measure  Help needed turning from your back to your side while in a flat bed without using bedrails?: A Lot Help needed moving from lying on your back to sitting on the side of a flat bed without using bedrails?: A Lot Help needed moving to and from a bed to a chair (including a wheelchair)?: A Lot Help needed standing up from a chair using your arms (e.g., wheelchair or bedside chair)?: A Lot Help needed to walk in hospital room?:  Total Help needed climbing 3-5 steps with a railing? : Total 6 Click Score: 10    End of Session Equipment Utilized During Treatment: Oxygen;Gait belt Activity Tolerance: Patient limited by fatigue Patient left: with call bell/phone within reach;in chair;with nursing/sitter in room;with chair alarm set Nurse Communication: Mobility status PT Visit Diagnosis: Other abnormalities of gait and mobility (R26.89);Difficulty in walking, not elsewhere classified (R26.2)     Time: 2774-1287 PT Time Calculation (min) (ACUTE ONLY): 25 min  Charges:  $Therapeutic Activity: 8-22 mins                     Tresa Endo PT Acute Rehabilitation Services Pager [phone] Office [phone]    Claretha Cooper 01/03/2020, 12:36 PM

## 2020-01-03 NOTE — Progress Notes (Addendum)
Progress Note  Patient Name: [name] Date of Encounter: 01/03/2020  [facility] Cardiologist: Candee Furbish, MD   Subjective   Breathing unchanged.  Denies pain.  Inpatient Medications    Scheduled Meds: . sodium chloride   Intravenous Once  . atorvastatin  10 mg Oral Daily  . bethanechol  10 mg Oral TID  . budesonide (PULMICORT) nebulizer solution  0.5 mg Nebulization BID  . Chlorhexidine Gluconate Cloth  6 each Topical Daily  . citalopram  10 mg Oral Daily  . dextromethorphan-guaiFENesin  1 tablet Oral BID  . digoxin  0.125 mg Oral Daily  . feeding supplement  1 Container Oral BID BM  . furosemide  80 mg Oral BID  . insulin aspart  0-5 Units Subcutaneous QHS  . insulin aspart  0-9 Units Subcutaneous TID WC  . ipratropium-albuterol  3 mL Nebulization TID  . mouth rinse  15 mL Mouth Rinse BID  . pantoprazole  40 mg Oral Daily  . tamsulosin  0.4 mg Oral QPC supper  . cyanocobalamin  1,000 mcg Oral Daily   Continuous Infusions:  PRN Meds: albuterol, ondansetron (ZOFRAN) IV, oxyCODONE-acetaminophen, polyethylene glycol, senna-docusate   Vital Signs    Vitals:   01/03/20 0600 01/03/20 0700 01/03/20 0800 01/03/20 0802  BP: (!) 95/47     Pulse: 74 78    Resp: 18 15    Temp:   98.1 F (36.7 C)   TempSrc:   Oral   SpO2:    91%  Weight:      Height:        Intake/Output Summary (Last 24 hours) at 01/03/2020 0846 Last data filed at 01/03/2020 0406 Gross per 24 hour  Intake 360 ml  Output 2150 ml  Net -1790 ml   Last 3 Weights 01/03/2020 01/02/2020 01/01/2020  Weight (lbs) 150 lb 9.2 oz 149 lb 11.1 oz 156 lb 8.4 oz  Weight (kg) 68.3 kg 67.9 kg 71 kg      Telemetry    Sinus rhythm.  Frequent PVCs and NSVT.  Ventricular bigeminy.- Personally Reviewed  ECG    n/a - Personally Reviewed  Physical Exam   VS:  BP (!) 95/47   Pulse 78   Temp 98.1 F (36.7 C) (Oral)   Resp 15   Ht 6\' 1"  (1.854 m)   Wt 68.3 kg   SpO2 91%   BMI 19.87 kg/m  , BMI Body  mass index is 19.87 kg/m. GENERAL: Critically ill-appearing.  Dyspneic.  Frail. HEENT: Pupils equal round and reactive, fundi not visualized, oral mucosa unremarkable NECK:  No jugular venous distention, waveform within normal limits, carotid upstroke brisk and symmetric, no bruits LUNGS: Coarse breath sounds throughout. HEART: Mostly regular with occasional ectopy.  PMI not displaced or sustained,S1 and S2 within normal limits, no S3, no S4, no clicks, no rubs, no murmurs ABD:  Flat, positive bowel sounds normal in frequency in pitch, no bruits, no rebound, no guarding, no midline pulsatile mass, no hepatomegaly, no splenomegaly EXT:  2 plus pulses throughout, no edema, no cyanosis no clubbing SKIN:  No rashes no nodules NEURO:  Cranial nerves II through XII grossly intact, motor grossly intact throughout PSYCH:  Cognitively intact, oriented to person place and time   Labs    High Sensitivity Troponin:   Recent Labs  Lab 12/06/19 1824 12/06/19 2140  TROPONINIHS 19* 19*      Chemistry Recent Labs  Lab 12/30/19 0257 12/30/19 0257 12/31/19 0210 12/31/19 0210 01/01/20 0201  01/02/20 0150 01/03/20 0217  NA 135   < > 135   < > 133* 132* 132*  K 4.7   < > 3.9   < > 3.8 3.4* 4.1  CL 95*   < > 93*   < > 91* 92* 92*  CO2 33*   < > 31   < > 31 31 32  GLUCOSE 148*   < > 126*   < > 161* 168* 176*  BUN 21   < > 23   < > 25* 22 23  CREATININE 0.65   < > 0.69   < > 0.81 0.68 0.75  CALCIUM 7.9*   < > 7.8*   < > 7.8* 7.9* 7.9*  PROT 5.5*  --  5.5*  --  5.7*  --   --   ALBUMIN 2.5*  --  2.5*  --  2.4*  --   --   AST 8*  --  9*  --  11*  --   --   ALT 8  --  7  --  8  --   --   ALKPHOS 67  --  61  --  67  --   --   BILITOT 1.4*  --  1.5*  --  1.7*  --   --   GFRNONAA >60   < > >60   < > >60 >60 >60  GFRAA >60   < > >60   < > >60 >60 >60  ANIONGAP 7   < > 11   < > 11 9 8    < > = values in this interval not displayed.     Hematology Recent Labs  Lab 01/01/20 0201 01/02/20 0150  01/03/20 0217  WBC 9.7 10.1 10.2  RBC 3.06* 2.89* 3.07*  HGB 8.0* 7.6* 7.8*  HCT 26.4* 25.0* 26.1*  MCV 86.3 86.5 85.0  MCH 26.1 26.3 25.4*  MCHC 30.3 30.4 29.9*  RDW 21.5* 21.2* 21.0*  PLT 263 261 248    BNP Recent Labs  Lab 12/29/19 0211 01/02/20 0150  BNP 1,473.9* 673.2*     DDimer No results for input(s): DDIMER in the last 168 hours.   Radiology    No results found.  Cardiac Studies   TTE 11/14/19: 1. LVEF is severely depressed with diffuse hypokinesis, worse in the  septal and apical walls Compared to report from Nov 2020 echo, LVEF is  worse (apical views foreshortened in 2020 exam which makes assessment  difficult). Left ventricular ejection  fraction, by estimation, is 25 to 30%. The left ventricle has severely  decreased function. The left ventricle demonstrates global hypokinesis.  There is mild left ventricular hypertrophy. Left ventricular diastolic  parameters are indeterminate.  2. Right ventricular systolic function is moderately reduced. The right  ventricular size is severely enlarged. There is moderately elevated  pulmonary artery systolic pressure.  3. Left atrial size was severely dilated.  4. Right atrial size was severely dilated.  5. A 33 mm [employer] Mosai bioprosthesisiis present Peak and mean  gradients through the vave are 15 and 6 mm Hg respectively, both lower  than previous echo report in 2020.Marland Kitchen The mitral valve has been  repaired/replaced. Trivial mitral valve regurgitation.  6. The aortic valve is abnormal. Aortic valve regurgitation is not  visualized. Mild to moderate aortic valve sclerosis/calcification is  present, without any evidence of aortic stenosis.  7. Aortic root is 51 mm Previous echo measured echo above this; current  exam did not  image that high.. Aortic dilatation noted. Aneurysm of the  aortic root. There is moderate to severe dilatation of the aortic root  measuring 51 mm.  8. The inferior vena cava is  diated in size with <50% respiratory  variability, suggesting right atrial pressure of 15 mmHg.   Patient Profile     Mr. Strahm is a 52M with chronic systolic and diastolic heart failure (LVEF 25-30%), R ventricular failure, severe aneurysm of the aortic root, s/p MVR, COPD, and permanent atrial fibrillation admitted with acute on chronic hypoxic respiratory failure, GI bleed and obstructive cecal mass.  Assessment & Plan     # Acute on chronic systolic and diastolic heart failure:  Volume status stable.he continues to maintain a negative fluid balance on oral diuretics.  Renal function is stable.  Yesterday he was net -1.7 L.  Oral intake is limited.  We will plan to continue Lasix twice daily for another couple days and then will reduce to daily on 6/27.  Continue digoxin.  # NSVT, PVCs:  Unable to add beta-blocker due to hypotension.  Defibrillator deactivated as he is going out on hospice.  # Hyperlipidemia:  We will discontinue atorvastatin, as this is not consistent discharged on hospice.  [facility] will sign off.   Medication Recommendations:  Lasix 80mg  bid today and 6/26.  Then 80mg  daily Other recommendations (labs, testing, etc):  none Follow up as an outpatient:  As needed   For questions or updates, please contact [place] Please consult [email or web address] for contact info under        Signed, Skeet Latch, MD  01/03/2020, 8:46 AM

## 2020-01-03 NOTE — Progress Notes (Signed)
Occupational Therapy Treatment Patient Details Name: [name] MRN: [id number] DOB: [birth year] Today's Date: 01/03/2020    History of present illness 74 yo male presented from rehab center with abdominal pain and maroon colored stool.  PCCM asked to admit to ICU.  Recent admission at [facility] where pt had CT abd/pelvis 12/06/19 that showed possible cecal mass. Pt DCed to [facility]   He is DNR/DNI.  Opted against any endoscopic procedures. PMH of CHF, COPD on 5L O2 baseline, a fib, lung cancer s/p wedge resection, DM2, HTN, HLD   OT comments  Treatment focused on participating in functional mobility and self care tasks to improve activity tolerance. Patient able to stand and transfer to [facility] and then to recliner. Patient total assist for toileting - needing to hold onto walker during task. Patient on 30 liters HFNC w/ 40% FIO2 and sats dropping to 70s with minimal activity. Patient however doesn't appear to be any respiratory distress during tasks just generally ill appearing and weak.   Follow Up Recommendations  SNF    Equipment Recommendations       Recommendations for Other Services      Precautions / Restrictions Precautions Precautions: Fall Precaution Comments: HFNC 30L 40%, watch sats, frequent BM's Restrictions Weight Bearing Restrictions: No       Mobility Bed Mobility     Rolling: Min assist Sidelying to sit: Mod assist       General bed mobility comments: Patient assisted with transfer to side of the bed - pulling on bed rails.  Transfers Overall transfer level: Needs assistance Equipment used: Rolling walker (2 wheeled) Transfers: Sit to/from [facility] Sit to Stand: Mod assist;+2 physical assistance;+2 safety/equipment Stand pivot transfers: +2 physical assistance;+2 safety/equipment;Mod assist       General transfer comment: Stood at side of bed holding onto walker for pericare. Transferred to [facility]. Again stood for pericare/toileting  clean up with total assist. steps to recliner, very unsteady.    Balance Overall balance assessment: Needs assistance Sitting-balance support: Feet supported;No upper extremity supported Sitting balance-Leahy Scale: Fair     Standing balance support: Bilateral upper extremity supported;During functional activity Standing balance-Leahy Scale: Poor Standing balance comment: BUE on walker with standing.                           ADL either performed or assessed with clinical judgement   ADL                           Toilet Transfer: Minimal assistance;RW;Stand-pivot;BSC;+2 for safety/equipment (+2 to manage lines and oxygen requirements)   Toileting- Clothing Manipulation and Hygiene: Total assistance;Sit to/from stand (Total assist for toileting (BM x 2). Patient standing and holding onto walker.)               Vision       Perception     Praxis      Cognition Arousal/Alertness: Awake/alert Behavior During Therapy: Flat affect Overall Cognitive Status: Difficult to assess                                 General Comments: answers with few words, minimal engagement        Exercises     Shoulder Instructions       General Comments      Pertinent Vitals/ Pain  Pain Assessment: Faces Faces Pain Scale: Hurts even more Pain Location: rectume/buttocks Pain Descriptors / Indicators: Grimacing Pain Intervention(s): Monitored during session;Limited activity within patient's tolerance  Home Living                                          Prior Functioning/Environment              Frequency  Min 2X/week        Progress Toward Goals  OT Goals(current goals can now be found in the care plan section)  Progress towards OT goals: Not progressing toward goals - comment (patient continues to require excessive oxygen needs. Limited activity tolerance secondary to dropping o2 sats.)     Plan Discharge  plan remains appropriate    Co-evaluation    PT/OT/SLP Co-Evaluation/Treatment: Yes Reason for Co-Treatment: For patient/therapist safety;To address functional/ADL transfers PT goals addressed during session: Mobility/safety with mobility OT goals addressed during session: ADL's and self-care      AM-PAC OT "6 Clicks" Daily Activity     Outcome Measure     Help from another person taking care of personal grooming?: A Little Help from another person toileting, which includes using toliet, bedpan, or urinal?: Total Help from another person bathing (including washing, rinsing, drying)?: A Lot Help from another person to put on and taking off regular upper body clothing?: A Little Help from another person to put on and taking off regular lower body clothing?: A Lot 6 Click Score: 11    End of Session Equipment Utilized During Treatment: Gait belt;Oxygen;Rolling walker  OT Visit Diagnosis: Unsteadiness on feet (R26.81);Other abnormalities of gait and mobility (R26.89);Muscle weakness (generalized) (M62.81)   Activity Tolerance Patient tolerated treatment well;Other (comment)   Patient Left in chair;with call bell/phone within reach;with chair alarm set   Nurse Communication Mobility status        Time: 7124-5809 OT Time Calculation (min): 23 min  Charges: OT Treatments $Self Care/Home Management : 8-22 mins  Derl Barrow, OTR/L [place]  Office [phone] Pager: [place] 01/03/2020, 12:35 PM

## 2020-01-03 NOTE — Progress Notes (Signed)
Confirmed with Dionne Milo, [employer] rep that defibrillator function of device was deactivated. Chenika Nevils PA-C

## 2020-01-04 LAB — CBC
HCT: 26.9 % — ABNORMAL LOW (ref 39.0–52.0)
Hemoglobin: 8.1 g/dL — ABNORMAL LOW (ref 13.0–17.0)
MCH: 26 pg (ref 26.0–34.0)
MCHC: 30.1 g/dL (ref 30.0–36.0)
MCV: 86.5 fL (ref 80.0–100.0)
Platelets: 259 K/uL (ref 150–400)
RBC: 3.11 MIL/uL — ABNORMAL LOW (ref 4.22–5.81)
RDW: 20.9 % — ABNORMAL HIGH (ref 11.5–15.5)
WBC: 9.8 K/uL (ref 4.0–10.5)
nRBC: 0 % (ref 0.0–0.2)

## 2020-01-04 LAB — GLUCOSE, CAPILLARY
Glucose-Capillary: 159 mg/dL — ABNORMAL HIGH (ref 70–99)
Glucose-Capillary: 190 mg/dL — ABNORMAL HIGH (ref 70–99)
Glucose-Capillary: 277 mg/dL — ABNORMAL HIGH (ref 70–99)
Glucose-Capillary: 138 mg/dL — ABNORMAL HIGH (ref 70–99)

## 2020-01-04 LAB — BASIC METABOLIC PANEL
Anion gap: 11 (ref 5–15)
BUN: 23 mg/dL (ref 8–23)
CO2: 30 mmol/L (ref 22–32)
Calcium: 7.8 mg/dL — ABNORMAL LOW (ref 8.9–10.3)
Chloride: 90 mmol/L — ABNORMAL LOW (ref 98–111)
Creatinine, Ser: 0.8 mg/dL (ref 0.61–1.24)
GFR calc Af Amer: >60 mL/min (ref >60)
GFR calc non Af Amer: >60 mL/min (ref >60)
Glucose, Bld: 180 mg/dL — ABNORMAL HIGH (ref 70–99)
Potassium: 4.4 mmol/L (ref 3.5–5.1)
Sodium: 131 mmol/L — ABNORMAL LOW (ref 135–145)

## 2020-01-04 LAB — MAGNESIUM: Magnesium: 2.1 mg/dL (ref 1.7–2.4)

## 2020-01-04 NOTE — Progress Notes (Signed)
PROGRESS NOTE    [name]  WUJ:[id number] DOB: Mar 14, 1946 DOA: 12/24/2019 PCP: [name], NP   Brief Narrative:  74 year old with history of CHF EF 25% status post BiV ICD, severe COPD, respiratory failure, permanent A. fib status post AV node ablation, ICH, bioprosthetic MVR, DM2, NSCLC s/p resection HTN admitted for GI bleed to have cecal mass complicated by CHF exacerbation. CTA abd= neg for GI bleed. Family doesn't want EGD or C scope. Currently PRN transfusions and lasix. Palliative team following for now.   Assessment & Plan:   Principal Problem:   SOB (shortness of breath) Active Problems:   Lung cancer (HCC)   Chronic atrial fibrillation (HCC)   Acute exacerbation of CHF (congestive heart failure) (HCC)   Pressure ulcer   Gastrointestinal hemorrhage   Acute blood loss anemia   Protein-calorie malnutrition, severe   Hemorrhagic shock (HCC)   Hypoxia   Generalized pain   General weakness   Shortness of breath  Acute on chronic combined systolic and diastolic CHF with an EF of 25 to 30% status post AICD and pacemaker CAD Permanent A fib,  WHO group 2 and 3 pulmonary HTN HLD. Minimal to no improvement compared to yesterday.  Currently rate controlled.  Not on anticoagulation due to bleeding risk and history of ICH Lasix 80 mg twice daily today, daily starting tomorrow. Deactivated defibrillator function, continue BiV pacing. BiPAP as necessary. CT chest shows bilateral pleural effusions.  Lower GI bleeding with concern for cecal mass noted on CT abd/pelvis 12/06/19. Does not want any endoscopic evaluation at this time.  Palliative care team following.  Transfuse as necessary  Suprapubic pain Resolved  Acute on chronic hypoxic respiratory failure, worsening and on heated high flow nasal cannula secondary to severe COPD/emphysema History of non-small cell lung cancer status post VATS Continues to be short of breath, minimal improvement.  Continue aggressive  scheduled and as needed bronchodilators.  I-S/flutter  DM type II poorly controlled with hyperglycemia. Metformin on hold currently on insulin sliding scale and Accu-Chek.  Hx of Depression. Citalopram 10 mg daily  BPH, urine retention. Bethanechol 10 mg po TID, Tamsuolsin 0.4 po Daily  Deconditioning. PT/OT once medically stable  History of alcoholic cirrhosis Supportive care.  Overall appears to be compensated from a hepatic standpoint  Goals of care. Palliative care team following, currently patient is DNR.  Continue management as planned above with as needed BiPAP.  Stage 1 Coccyx Ulcer, pOA -Continue Wound Care  Overall poor prognosis, patient understands this.  He is very poor/minimal pulmonary reserve now complicated by CHF exacerbation, and pleural effusions  DVT prophylaxis: Place and maintain sequential compression device Start: 12/24/19 0843  Code Status: DNR Family Communication: None at bedside  Status is: Inpatient  Remains inpatient appropriate because:Hemodynamically unstable   Dispo: The patient is from: Home              Anticipated d/c is to: To be determined              Anticipated d/c date is: > 3 days              Patient currently is not medically stable to d/c.  Still quite hypoxic on heated high flow.  Maintains in stepdown unit  Subjective: Still short of breath with minimal movement and exertion.  Review of Systems Otherwise negative except as per HPI, including: General: Denies fever, chills, night sweats or unintended weight loss. Resp: Denies cough, wheezing, shortness of breath. Cardiac: Denies  chest pain, palpitations, orthopnea, paroxysmal nocturnal dyspnea. GI: Denies abdominal pain, nausea, vomiting, diarrhea or constipation GU: Denies dysuria, frequency, hesitancy or incontinence MS: Denies muscle aches, joint pain or swelling Neuro: Denies headache, neurologic deficits (focal weakness, numbness, tingling), abnormal  gait Psych: Denies anxiety, depression, SI/HI/AVH Skin: Denies new rashes or lesions ID: Denies sick contacts, exotic exposures, travel  Examination: Constitutional: Remains on heated high flow, appears chronically ill Respiratory: Diffuse coarse breath sounds Cardiovascular: Normal sinus rhythm, no rubs Abdomen: Nontender nondistended good bowel sounds Musculoskeletal: No edema noted Skin: No rashes seen Neurologic: CN 2-12 grossly intact.  And nonfocal Psychiatric: Normal judgment and insight. Alert and oriented x 3. Normal mood.  Objective: Vitals:   01/04/20 0500 01/04/20 0600 01/04/20 0700 01/04/20 0812  BP: (!) 125/103 (!) 113/59 (!) 118/97   Pulse: 74 79 74   Resp: 18 (!) 21 16   Temp:      TempSrc:      SpO2: 91% 93% 91% 100%  Weight:      Height:        Intake/Output Summary (Last 24 hours) at 01/04/2020 0937 Last data filed at 01/04/2020 0630 Gross per 24 hour  Intake 800 ml  Output 2175 ml  Net -1375 ml   Filed Weights   01/01/20 0500 01/02/20 0453 01/03/20 0406  Weight: 71 kg 67.9 kg 68.3 kg     Data Reviewed:   CBC: Recent Labs  Lab 12/29/19 0211 12/29/19 0211 12/30/19 0257 12/30/19 0257 12/31/19 0210 01/01/20 0201 01/02/20 0150 01/03/20 0217 01/04/20 0259  WBC 10.6*   < > 10.2   < > 10.1 9.7 10.1 10.2 9.8  NEUTROABS 8.6*  --  7.9*  --  8.0* 7.9*  --   --   --   HGB 7.7*   < > 8.2*   < > 8.0* 8.0* 7.6* 7.8* 8.1*  HCT 26.1*   < > 26.8*   < > 26.0* 26.4* 25.0* 26.1* 26.9*  MCV 89.4   < > 87.3   < > 86.1 86.3 86.5 85.0 86.5  PLT 207   < > 233   < > 242 263 261 248 259   < > = values in this interval not displayed.   Basic Metabolic Panel: Recent Labs  Lab 12/29/19 0211 12/29/19 0211 12/30/19 0257 12/30/19 0257 12/31/19 0210 01/01/20 0201 01/02/20 0150 01/03/20 0217 01/04/20 0259  NA 133*   < > 135   < > 135 133* 132* 132* 131*  K 4.9   < > 4.7   < > 3.9 3.8 3.4* 4.1 4.4  CL 94*   < > 95*   < > 93* 91* 92* 92* 90*  CO2 30   < > 33*    < > 31 31 31  32 30  GLUCOSE 210*   < > 148*   < > 126* 161* 168* 176* 180*  BUN 27*   < > 21   < > 23 25* 22 23 23   CREATININE 0.66   < > 0.65   < > 0.69 0.81 0.68 0.75 0.80  CALCIUM 8.1*   < > 7.9*   < > 7.8* 7.8* 7.9* 7.9* 7.8*  MG 2.1   < > 1.9   < > 1.9 2.0 1.9 2.1 2.1  PHOS 3.0  --  3.1  --  3.7 4.0  --   --   --    < > = values in this interval not displayed.   GFR: Estimated  Creatinine Clearance: 80.6 mL/min (by C-G formula based on SCr of 0.8 mg/dL). Liver Function Tests: Recent Labs  Lab 12/29/19 0211 12/30/19 0257 12/31/19 0210 01/01/20 0201  AST 8* 8* 9* 11*  ALT 8 8 7 8   ALKPHOS 70 67 61 67  BILITOT 0.8 1.4* 1.5* 1.7*  PROT 5.5* 5.5* 5.5* 5.7*  ALBUMIN 2.6* 2.5* 2.5* 2.4*   No results for input(s): LIPASE, AMYLASE in the last 168 hours. No results for input(s): AMMONIA in the last 168 hours. Coagulation Profile: No results for input(s): INR, PROTIME in the last 168 hours. Cardiac Enzymes: No results for input(s): CKTOTAL, CKMB, CKMBINDEX, TROPONINI in the last 168 hours. BNP (last 3 results) No results for input(s): PROBNP in the last 8760 hours. HbA1C: No results for input(s): HGBA1C in the last 72 hours. CBG: Recent Labs  Lab 01/03/20 0733 01/03/20 1154 01/03/20 1629 01/03/20 2158 01/04/20 0735  GLUCAP 146* 186* 146* 197* 159*   Lipid Profile: No results for input(s): CHOL, HDL, LDLCALC, TRIG, CHOLHDL, LDLDIRECT in the last 72 hours. Thyroid Function Tests: No results for input(s): TSH, T4TOTAL, FREET4, T3FREE, THYROIDAB in the last 72 hours. Anemia Panel: No results for input(s): VITAMINB12, FOLATE, FERRITIN, TIBC, IRON, RETICCTPCT in the last 72 hours. Sepsis Labs: Recent Labs  Lab 01/02/20 0150  PROCALCITON <0.10    No results found for this or any previous visit (from the past 240 hour(s)).       Radiology Studies: No results found.      Scheduled Meds: . sodium chloride   Intravenous Once  . bethanechol  10 mg Oral TID  .  budesonide (PULMICORT) nebulizer solution  0.5 mg Nebulization BID  . Chlorhexidine Gluconate Cloth  6 each Topical Daily  . citalopram  10 mg Oral Daily  . dextromethorphan-guaiFENesin  1 tablet Oral BID  . digoxin  0.125 mg Oral Daily  . feeding supplement  1 Container Oral BID BM  . furosemide  80 mg Oral BID  . insulin aspart  0-5 Units Subcutaneous QHS  . insulin aspart  0-9 Units Subcutaneous TID WC  . ipratropium-albuterol  3 mL Nebulization TID  . mouth rinse  15 mL Mouth Rinse BID  . pantoprazole  40 mg Oral Daily  . tamsulosin  0.4 mg Oral QPC supper  . cyanocobalamin  1,000 mcg Oral Daily   Continuous Infusions:   LOS: 11 days   Time spent= 35 mins    Waleska Buttery Arsenio Loader, MD [facility]  If 7PM-7AM, please contact night-coverage  01/04/2020, 9:37 AM

## 2020-01-05 LAB — CBC
HCT: 27.8 % — ABNORMAL LOW (ref 39.0–52.0)
Hemoglobin: 8.5 g/dL — ABNORMAL LOW (ref 13.0–17.0)
MCH: 26.7 pg (ref 26.0–34.0)
MCHC: 30.6 g/dL (ref 30.0–36.0)
MCV: 87.4 fL (ref 80.0–100.0)
Platelets: 264 10*3/uL (ref 150–400)
RBC: 3.18 MIL/uL — ABNORMAL LOW (ref 4.22–5.81)
RDW: 20.7 % — ABNORMAL HIGH (ref 11.5–15.5)
WBC: 10.5 10*3/uL (ref 4.0–10.5)
nRBC: 0 % (ref 0.0–0.2)

## 2020-01-05 LAB — MAGNESIUM: Magnesium: 2.1 mg/dL (ref 1.7–2.4)

## 2020-01-05 LAB — BASIC METABOLIC PANEL
Anion gap: 6 (ref 5–15)
BUN: 25 mg/dL — ABNORMAL HIGH (ref 8–23)
CO2: 34 mmol/L — ABNORMAL HIGH (ref 22–32)
Calcium: 8 mg/dL — ABNORMAL LOW (ref 8.9–10.3)
Chloride: 92 mmol/L — ABNORMAL LOW (ref 98–111)
Creatinine, Ser: 0.82 mg/dL (ref 0.61–1.24)
GFR calc Af Amer: >60 mL/min (ref >60)
GFR calc non Af Amer: >60 mL/min (ref >60)
Glucose, Bld: 179 mg/dL — ABNORMAL HIGH (ref 70–99)
Potassium: 4.1 mmol/L (ref 3.5–5.1)
Sodium: 132 mmol/L — ABNORMAL LOW (ref 135–145)

## 2020-01-05 LAB — GLUCOSE, CAPILLARY
Glucose-Capillary: 158 mg/dL — ABNORMAL HIGH (ref 70–99)
Glucose-Capillary: 213 mg/dL — ABNORMAL HIGH (ref 70–99)
Glucose-Capillary: 179 mg/dL — ABNORMAL HIGH (ref 70–99)
Glucose-Capillary: 146 mg/dL — ABNORMAL HIGH (ref 70–99)

## 2020-01-05 MED ORDER — FUROSEMIDE 40 MG PO TABS
80.0000 mg | ORAL_TABLET | Freq: Every day | ORAL | Status: DC
  Administered 2020-01-05 – 2020-01-15 (×11): 80 mg via ORAL
  Filled 2020-01-05 (×11): qty 2

## 2020-01-05 NOTE — Progress Notes (Signed)
PROGRESS NOTE    [name]  IRC:[id number] DOB: 1945/08/02 DOA: 12/24/2019 PCP: [name], NP   Brief Narrative:  74 year old with history of CHF EF 25% status post BiV ICD, severe COPD, respiratory failure, permanent A. fib status post AV node ablation, ICH, bioprosthetic MVR, DM2, NSCLC s/p resection HTN admitted for GI bleed to have cecal mass complicated by CHF exacerbation. CTA abd= neg for GI bleed. Family doesn't want EGD or C scope. Currently PRN transfusions and lasix. Palliative team following for now.   Assessment & Plan:   Principal Problem:   SOB (shortness of breath) Active Problems:   Lung cancer (HCC)   Chronic atrial fibrillation (HCC)   Acute exacerbation of CHF (congestive heart failure) (HCC)   Pressure ulcer   Gastrointestinal hemorrhage   Acute blood loss anemia   Protein-calorie malnutrition, severe   Hemorrhagic shock (HCC)   Hypoxia   Generalized pain   General weakness   Shortness of breath  Acute on chronic combined systolic and diastolic CHF with an EF of 25 to 30% status post AICD and pacemaker CAD Permanent A fib,  WHO group 2 and 3 pulmonary HTN HLD. Not much improvement.  Still high oxygen requirements.  Currently rate controlled.  Not on anticoagulation due to bleeding risk and history of ICH Lasix changed to 80 mg daily Deactivated defibrillator function, continue BiV pacing. BiPAP as necessary. CT chest shows bilateral pleural effusions.  Lower GI bleeding with concern for cecal mass noted on CT abd/pelvis 12/06/19. Does not want any endoscopic evaluation at this time.  Palliative care team following.  Transfuse as necessary  Suprapubic pain Resolved  Acute on chronic hypoxic respiratory failure, worsening and on heated high flow nasal cannula secondary to severe COPD/emphysema History of non-small cell lung cancer status post VATS Continues to be short of breath, minimal improvement.  Continue aggressive scheduled and as  needed bronchodilators.  I-S/flutter  DM type II poorly controlled with hyperglycemia. Metformin on hold currently on insulin sliding scale and Accu-Chek.  Hx of Depression. Citalopram 10 mg daily  BPH, urine retention. Bethanechol 10 mg po TID, Tamsuolsin 0.4 po Daily  Deconditioning. PT/OT once medically stable  History of alcoholic cirrhosis Supportive care.  Overall appears to be compensated from a hepatic standpoint  Goals of care. Palliative care team following, currently patient is DNR.  Continue management as planned above with as needed BiPAP.  Stage 1 Coccyx Ulcer, pOA -Continue Wound Care  Overall poor prognosis, patient understands this.  He is very poor/minimal pulmonary reserve now complicated by CHF exacerbation, and pleural effusions.  Explained to patient and the family that at this time he has 2 options either home with hospice or LTAC.  Patient prefers to go home eventually.  DVT prophylaxis: Place and maintain sequential compression device Start: 12/24/19 0843  Code Status: DNR Family Communication: Tammy at bedside  Status is: Inpatient  Remains inpatient appropriate because:Hemodynamically unstable   Dispo: The patient is from: Home              Anticipated d/c is to: To be determined              Anticipated d/c date is: > 3 days              Patient currently is not medically stable to d/c.  Still quite hypoxic on heated high flow.  Maintains in stepdown unit  Subjective: Still short of breath with minimal exertion requiring heated high flow.  Try  to turn down a little bit and immediately desaturates.  Review of Systems Otherwise negative except as per HPI, including: General: Denies fever, chills, night sweats or unintended weight loss. Resp: Denies hemoptysis Cardiac: Denies chest pain, palpitations, orthopnea, paroxysmal nocturnal dyspnea. GI: Denies abdominal pain, nausea, vomiting, diarrhea or constipation GU: Denies dysuria,  frequency, hesitancy or incontinence MS: Denies muscle aches, joint pain or swelling Neuro: Denies headache, neurologic deficits (focal weakness, numbness, tingling), abnormal gait Psych: Denies anxiety, depression, SI/HI/AVH Skin: Denies new rashes or lesions ID: Denies sick contacts, exotic exposures, travel  Examination: Constitutional: Cachectic frail chronically ill-appearing.  Bilateral temporal wasting.  On heated high flow. Respiratory: Diffuse bilateral coarse breath sounds Cardiovascular: Normal sinus rhythm, no rubs  Abdomen: Nontender nondistended good bowel sounds Musculoskeletal: No edema noted Skin: No rashes seen Neurologic: CN 2-12 grossly intact.  And nonfocal Psychiatric: Normal judgment and insight. Alert and oriented x 3. Normal mood.  Objective: Vitals:   01/05/20 0400 01/05/20 0500 01/05/20 0800 01/05/20 0816  BP: (!) 119/47 (!) 94/53    Pulse: 74 73    Resp: 20 13    Temp:   98 F (36.7 C)   TempSrc:   Oral   SpO2: 96% 93%  94%  Weight:      Height:        Intake/Output Summary (Last 24 hours) at 01/05/2020 0914 Last data filed at 01/05/2020 0500 Gross per 24 hour  Intake 950 ml  Output 3490 ml  Net -2540 ml   Filed Weights   01/01/20 0500 01/02/20 0453 01/03/20 0406  Weight: 71 kg 67.9 kg 68.3 kg     Data Reviewed:   CBC: Recent Labs  Lab 12/30/19 0257 12/30/19 0257 12/31/19 0210 12/31/19 0210 01/01/20 0201 01/02/20 0150 01/03/20 0217 01/04/20 0259 01/05/20 0347  WBC 10.2   < > 10.1   < > 9.7 10.1 10.2 9.8 10.5  NEUTROABS 7.9*  --  8.0*  --  7.9*  --   --   --   --   HGB 8.2*   < > 8.0*   < > 8.0* 7.6* 7.8* 8.1* 8.5*  HCT 26.8*   < > 26.0*   < > 26.4* 25.0* 26.1* 26.9* 27.8*  MCV 87.3   < > 86.1   < > 86.3 86.5 85.0 86.5 87.4  PLT 233   < > 242   < > 263 261 248 259 264   < > = values in this interval not displayed.   Basic Metabolic Panel: Recent Labs  Lab 12/30/19 0257 12/30/19 0257 12/31/19 0210 12/31/19 0210  01/01/20 0201 01/02/20 0150 01/03/20 0217 01/04/20 0259 01/05/20 0347  NA 135   < > 135   < > 133* 132* 132* 131* 132*  K 4.7   < > 3.9   < > 3.8 3.4* 4.1 4.4 4.1  CL 95*   < > 93*   < > 91* 92* 92* 90* 92*  CO2 33*   < > 31   < > 31 31 32 30 34*  GLUCOSE 148*   < > 126*   < > 161* 168* 176* 180* 179*  BUN 21   < > 23   < > 25* 22 23 23  25*  CREATININE 0.65   < > 0.69   < > 0.81 0.68 0.75 0.80 0.82  CALCIUM 7.9*   < > 7.8*   < > 7.8* 7.9* 7.9* 7.8* 8.0*  MG 1.9   < > 1.9   < >  2.0 1.9 2.1 2.1 2.1  PHOS 3.1  --  3.7  --  4.0  --   --   --   --    < > = values in this interval not displayed.   GFR: Estimated Creatinine Clearance: 78.7 mL/min (by C-G formula based on SCr of 0.82 mg/dL). Liver Function Tests: Recent Labs  Lab 12/30/19 0257 12/31/19 0210 01/01/20 0201  AST 8* 9* 11*  ALT 8 7 8   ALKPHOS 67 61 67  BILITOT 1.4* 1.5* 1.7*  PROT 5.5* 5.5* 5.7*  ALBUMIN 2.5* 2.5* 2.4*   No results for input(s): LIPASE, AMYLASE in the last 168 hours. No results for input(s): AMMONIA in the last 168 hours. Coagulation Profile: No results for input(s): INR, PROTIME in the last 168 hours. Cardiac Enzymes: No results for input(s): CKTOTAL, CKMB, CKMBINDEX, TROPONINI in the last 168 hours. BNP (last 3 results) No results for input(s): PROBNP in the last 8760 hours. HbA1C: No results for input(s): HGBA1C in the last 72 hours. CBG: Recent Labs  Lab 01/04/20 0735 01/04/20 1218 01/04/20 1627 01/04/20 2107 01/05/20 0817  GLUCAP 159* 190* 277* 138* 158*   Lipid Profile: No results for input(s): CHOL, HDL, LDLCALC, TRIG, CHOLHDL, LDLDIRECT in the last 72 hours. Thyroid Function Tests: No results for input(s): TSH, T4TOTAL, FREET4, T3FREE, THYROIDAB in the last 72 hours. Anemia Panel: No results for input(s): VITAMINB12, FOLATE, FERRITIN, TIBC, IRON, RETICCTPCT in the last 72 hours. Sepsis Labs: Recent Labs  Lab 01/02/20 0150  PROCALCITON <0.10    No results found for this or  any previous visit (from the past 240 hour(s)).       Radiology Studies: No results found.      Scheduled Meds: . sodium chloride   Intravenous Once  . bethanechol  10 mg Oral TID  . budesonide (PULMICORT) nebulizer solution  0.5 mg Nebulization BID  . Chlorhexidine Gluconate Cloth  6 each Topical Daily  . citalopram  10 mg Oral Daily  . dextromethorphan-guaiFENesin  1 tablet Oral BID  . digoxin  0.125 mg Oral Daily  . feeding supplement  1 Container Oral BID BM  . furosemide  80 mg Oral Daily  . insulin aspart  0-5 Units Subcutaneous QHS  . insulin aspart  0-9 Units Subcutaneous TID WC  . ipratropium-albuterol  3 mL Nebulization TID  . mouth rinse  15 mL Mouth Rinse BID  . pantoprazole  40 mg Oral Daily  . tamsulosin  0.4 mg Oral QPC supper  . cyanocobalamin  1,000 mcg Oral Daily   Continuous Infusions:   LOS: 12 days   Time spent= 35 mins    Yarah Fuente Arsenio Loader, MD [facility]  If 7PM-7AM, please contact night-coverage  01/05/2020, 9:14 AM

## 2020-01-05 NOTE — Progress Notes (Signed)
Daily Progress Note   Patient Name: [name]       Date: 01/05/2020 DOB: [birth year]  Age: 74 y.o. MRN#: [id number] Attending Physician: Damita Lack, MD Primary Care Physician: [name], NP Admit Date: 12/24/2019  Reason for Consultation/Follow-up: Establishing goals of care  Subjective: Awake alert, resting in bed, no family at bedside at time of visit, discussed with Dr Reesa Chew about next steps, recommended disposition options.   med history noted, chart reviewed, O2 requirements noted.    Length of Stay: 12  Current Medications: Scheduled Meds:  . sodium chloride   Intravenous Once  . bethanechol  10 mg Oral TID  . budesonide (PULMICORT) nebulizer solution  0.5 mg Nebulization BID  . Chlorhexidine Gluconate Cloth  6 each Topical Daily  . citalopram  10 mg Oral Daily  . dextromethorphan-guaiFENesin  1 tablet Oral BID  . digoxin  0.125 mg Oral Daily  . feeding supplement  1 Container Oral BID BM  . furosemide  80 mg Oral Daily  . insulin aspart  0-5 Units Subcutaneous QHS  . insulin aspart  0-9 Units Subcutaneous TID WC  . ipratropium-albuterol  3 mL Nebulization TID  . mouth rinse  15 mL Mouth Rinse BID  . pantoprazole  40 mg Oral Daily  . tamsulosin  0.4 mg Oral QPC supper  . cyanocobalamin  1,000 mcg Oral Daily    Continuous Infusions:   PRN Meds: albuterol, ondansetron (ZOFRAN) IV, oxyCODONE-acetaminophen, polyethylene glycol, senna-docusate  Physical Exam         General: Alert, awake, in no acute distress. On high flow nasal cannula   Heart: Regular rate and rhythm. No murmur appreciated. Lungs: Decreased air movement, scattered crackles Abdomen: Soft, nontender, nondistended, positive bowel sounds.  Ext: Trace edema Skin: Warm and dry Neuro:  Grossly intact, nonfocal.   Vital Signs: BP (!) 118/36 (BP Location: Right Arm)   Pulse (!) 57   Temp 98.1 F (36.7 C) (Oral)   Resp 20   Ht 6\' 1"  (1.854 m)   Wt 68.3 kg   SpO2 93%   BMI 19.87 kg/m  SpO2: SpO2: 93 % O2 Device: O2 Device: High Flow Nasal Cannula O2 Flow Rate: O2 Flow Rate (L/min): 30 L/min  Intake/output summary:   Intake/Output Summary (Last 24 hours) at 01/05/2020 1335 Last data  filed at 01/05/2020 1236 Gross per 24 hour  Intake 1550 ml  Output 3490 ml  Net -1940 ml   LBM: Last BM Date: 01/04/20 Baseline Weight: Weight: 61.7 kg Most recent weight: Weight: 68.3 kg       Palliative Assessment/Data:      Patient Active Problem List   Diagnosis Date Noted  . Generalized pain   . General weakness   . Shortness of breath   . Hemorrhagic shock ([place])   . SOB (shortness of breath)   . Hypoxia   . Protein-calorie malnutrition, severe 12/25/2019  . Gastrointestinal hemorrhage 12/24/2019  . Acute blood loss anemia   . Pressure ulcer 12/08/2019  . Depression, recurrent ([place])   . Acute respiratory failure with hypoxia ([place])   . Goals of care, counseling/discussion   . Palliative care by specialist   . Acute exacerbation of CHF (congestive heart failure) ([place]) 11/13/2019  . Elevated troponin   . Chronic atrial fibrillation ([place])   . Weakness generalized   . DNR (do not resuscitate)   . Acute on chronic combined systolic (congestive) and diastolic (congestive) heart failure ([place]) 04/27/2019  . COPD with acute exacerbation ([place]) 08/01/2018  . Pulmonary edema 07/30/2018  . Health care maintenance 05/09/2018  . Gastritis and gastroduodenitis   . Diverticulum of duodenum   . Aortic root dilatation ([place])   . 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 Atherosclerosis of aorta ([place]) 08/11/2016  . Orthostatic hypotension 09/26/2015  . Near syncope 09/24/2015  . Syncope 09/24/2015  . Hepatic cirrhosis ([place]) 09/25/2014  . History of colonic polyps 09/25/2014   . Serum total bilirubin elevated 06/30/2014  . Follow-up examination, following unspecified surgery 01/27/2014  . Lung cancer ([place]) 11/29/2013  . Prosthetic valve dysfunction   . Chronic combined systolic and diastolic CHF (congestive heart failure) ([place])   . Type 2 diabetes mellitus ([place]) 11/04/2013  . Myocardial infarction ([place]) 11/01/2013  . Cardiomyopathy, nonischemic ([place]) 11/01/2013  . Acute on chronic respiratory failure with hypoxia ([place]) 08/15/2013  . H/O intracranial hemorrhage 08/13/2013  . H/O endocarditis 08/13/2013  . H/O: CVA (cerebrovascular accident) 08/13/2013  . Community acquired pneumonia 08/13/2013  . Chronic respiratory failure assoc with chf/ PAH 08/12/2013  . H/O atrioventricular nodal ablation   . S/P MVR (mitral valve replacement)   . Hypertension associated with diabetes ([place])   . Hyperlipidemia associated with type 2 diabetes mellitus ([place])   . Permanent atrial fibrillation   . Ejection fraction < 50%   . ICD (implantable cardioverter-defibrillator), biventricular, in situ   . Renal artery stenosis ([place])   . Pulmonary hypertension ([place])   . Primary cancer of left upper lobe of lung ([place]) 04/20/2009  . COLONIC POLYPS, ADENOMATOUS 03/22/2007  . COPD GOLD II 01/11/2007  . GERD 01/11/2007    Palliative Care Assessment & Plan   Patient Profile: 74 y.o.malewith past medical history of combined CHF with EF 25%, status post AICD, severe COPD/emphysema, chronic respiratory failure on 5 L at skilled facility, permanent A. fib on ASA, intercranial hemorrhage, mitral regurg status post bioprosthetic valve replacement, non-small cell lung cancer status post wedge resection, DM type II, hypertension, hyperlipidemiaadmitted on 6/15/2021with abdominal pain and maroon-colored stool. Of note he recently had CT of abdomen pelvis that showed possible cecal mass. He has declined consideration for colonoscopy as he been told in the past he would be high risk for any  sort of sedating procedure.  Also, he has had increasing oxygen needs and is wearing 12 L  nasal cannula as well as intermittent nonrebreather which is up from his baseline of 5 L nasal cannula outside of the hospital.Palliative consulted for goals of care.  Recommendations/Plan:  DNR/DNI  Continue as needed oxycodone/acetaminophen 5/325.    for pain and can also use if needed for shortness of breath.   Continue current interventions.    Patient remains aware of the serious nature of his condition, including his high O2 requirements, ongoing functional decline and frailty.   Recommend LTACH with continuation of current therapies, versus home with hospice and more focus on symptom management. Patient will discuss with family.     Code Status:    Code Status Orders  (From admission, onward)         Start     Ordered   12/24/19 0843  Do not attempt resuscitation (DNR)  Continuous       Question Answer Comment  In the event of cardiac or respiratory ARREST Do not call a "code blue"   In the event of cardiac or respiratory ARREST Do not perform Intubation, CPR, defibrillation or ACLS   In the event of cardiac or respiratory ARREST Use medication by any route, position, wound care, and other measures to relive pain and suffering. May use oxygen, suction and manual treatment of airway obstruction as needed for comfort.      12/24/19 0842        Code Status History    Date Active Date Inactive Code Status Order ID Comments User Context   12/17/2019 1137 12/24/2019 0515 DNR [id number]  Gayland Curry, DO Outpatient   12/06/2019 2335 12/13/2019 0055 DNR [id number]  Lenore Cordia, MD ED   11/13/2019 2118 11/25/2019 1857 DNR [id number]  Truddie Hidden, MD ED   11/13/2019 2114 11/13/2019 2118 Full Code [id number]  Truddie Hidden, MD ED   06/07/2019 1321 06/10/2019 2108 DNR [id number]  Karmen Bongo, MD ED   04/27/2019 1937 05/02/2019 1915 DNR [id number]  Lenore Cordia, MD ED   07/30/2018 1722  08/02/2018 1814 DNR [id number]  Cristal Deer, MD Inpatient   09/24/2015 1730 09/27/2015 1751 Full Code [id number]  Kelvin Cellar, MD Inpatient   11/29/2013 1523 12/03/2013 1401 Full Code [id number]  [name], MD Inpatient   08/12/2013 1804 08/18/2013 1820 Full Code [id number]  Barrett, Evelene Croon, PA-C Inpatient   Advance Care Planning Activity    Advance Directive Documentation     Most Recent Value  Type of Advance Directive Out of facility DNR (pink MOST or yellow form)  Pre-existing out of facility DNR order (yellow form or pink MOST form) Pink MOST form placed in chart (order not valid for inpatient use)  "MOST" Form in Place? --       Prognosis:  Guarded  Discharge Planning:  To Be Determined  Care plan was discussed with IDT  Thank you for allowing the Palliative Medicine Team to assist in the care of this patient.   Time In: 11 Time Out: 11.15 Total Time 15 Prolonged Time Billed No      Greater than 50%  of this time was spent counseling and coordinating care related to the above assessment and plan.  Loistine Chance, MD  Please contact Palliative Medicine Team phone at [phone] for questions and concerns.

## 2020-01-06 ENCOUNTER — Ambulatory Visit: Payer: Medicare Other | Admitting: Thoracic Surgery (Cardiothoracic Vascular Surgery)

## 2020-01-06 LAB — GLUCOSE, CAPILLARY
Glucose-Capillary: 149 mg/dL — ABNORMAL HIGH (ref 70–99)
Glucose-Capillary: 206 mg/dL — ABNORMAL HIGH (ref 70–99)
Glucose-Capillary: 127 mg/dL — ABNORMAL HIGH (ref 70–99)
Glucose-Capillary: 148 mg/dL — ABNORMAL HIGH (ref 70–99)

## 2020-01-06 LAB — CBC
HCT: 25.5 % — ABNORMAL LOW (ref 39.0–52.0)
Hemoglobin: 7.5 g/dL — ABNORMAL LOW (ref 13.0–17.0)
MCH: 25.1 pg — ABNORMAL LOW (ref 26.0–34.0)
MCHC: 29.4 g/dL — ABNORMAL LOW (ref 30.0–36.0)
MCV: 85.3 fL (ref 80.0–100.0)
Platelets: 233 10*3/uL (ref 150–400)
RBC: 2.99 MIL/uL — ABNORMAL LOW (ref 4.22–5.81)
RDW: 20.3 % — ABNORMAL HIGH (ref 11.5–15.5)
WBC: 10.7 10*3/uL — ABNORMAL HIGH (ref 4.0–10.5)
nRBC: 0 % (ref 0.0–0.2)

## 2020-01-06 LAB — BASIC METABOLIC PANEL
Anion gap: 8 (ref 5–15)
BUN: 20 mg/dL (ref 8–23)
CO2: 32 mmol/L (ref 22–32)
Calcium: 7.7 mg/dL — ABNORMAL LOW (ref 8.9–10.3)
Chloride: 93 mmol/L — ABNORMAL LOW (ref 98–111)
Creatinine, Ser: 0.73 mg/dL (ref 0.61–1.24)
GFR calc Af Amer: >60 mL/min (ref >60)
GFR calc non Af Amer: >60 mL/min (ref >60)
Glucose, Bld: 166 mg/dL — ABNORMAL HIGH (ref 70–99)
Potassium: 3.6 mmol/L (ref 3.5–5.1)
Sodium: 133 mmol/L — ABNORMAL LOW (ref 135–145)

## 2020-01-06 LAB — URINE CULTURE
Culture: >=100000 — AB
Special Requests: NORMAL

## 2020-01-06 LAB — MAGNESIUM: Magnesium: 2.2 mg/dL (ref 1.7–2.4)

## 2020-01-06 MED ORDER — LIP MEDEX EX OINT: TOPICAL_OINTMENT | CUTANEOUS | Status: DC | PRN

## 2020-01-06 MED ORDER — POTASSIUM CHLORIDE CRYS ER 20 MEQ PO TBCR
40.0000 meq | EXTENDED_RELEASE_TABLET | Freq: Once | ORAL | Status: AC
  Administered 2020-01-06: 40 meq via ORAL
  Filled 2020-01-06: qty 2

## 2020-01-06 MED ORDER — ZINC OXIDE 40 % EX OINT
TOPICAL_OINTMENT | CUTANEOUS | Status: DC | PRN
  Filled 2020-01-06: qty 57

## 2020-01-06 NOTE — Progress Notes (Signed)
PT Cancellation Note  Patient Details Name: DONZELL COLLER MRN: [id number] DOB: [birth year]   Cancelled Treatment:    Reason Eval/Treat Not Completed: Patient declined, states that he doesn't feel well today .Claretha Cooper 01/06/2020, 10:47 AM  [facility] Pager [phone] Office [phone]

## 2020-01-06 NOTE — Progress Notes (Signed)
Daily Progress Note   Patient Name: [name]       Date: 01/06/2020 DOB: 06/13/1946  Age: 74 y.o. MRN#: [id number] Attending Physician: Damita Lack, MD Primary Care Physician: [name], NP Admit Date: 12/24/2019  Reason for Consultation/Follow-up: Establishing goals of care  Subjective: Patient is awake, alert, and able to participate in conversation. No family at bedside. He still has high O2 requirements. Discussed with Dr Reesa Chew about next steps - he spoke with family and patient and they were in agreement with LTAC with outpatient palliative care.   Med history noted - no PRN medications have been administered for pain/dyspnea. Chart reviewed, O2 requirements noted.    Length of Stay: 13  Current Medications: Scheduled Meds:  . sodium chloride   Intravenous Once  . bethanechol  10 mg Oral TID  . budesonide (PULMICORT) nebulizer solution  0.5 mg Nebulization BID  . Chlorhexidine Gluconate Cloth  6 each Topical Daily  . citalopram  10 mg Oral Daily  . dextromethorphan-guaiFENesin  1 tablet Oral BID  . digoxin  0.125 mg Oral Daily  . feeding supplement  1 Container Oral BID BM  . furosemide  80 mg Oral Daily  . insulin aspart  0-5 Units Subcutaneous QHS  . insulin aspart  0-9 Units Subcutaneous TID WC  . ipratropium-albuterol  3 mL Nebulization TID  . mouth rinse  15 mL Mouth Rinse BID  . pantoprazole  40 mg Oral Daily  . tamsulosin  0.4 mg Oral QPC supper  . cyanocobalamin  1,000 mcg Oral Daily    Continuous Infusions:   PRN Meds: albuterol, lip balm, liver oil-zinc oxide, ondansetron (ZOFRAN) IV, oxyCODONE-acetaminophen, polyethylene glycol, senna-docusate  Physical Exam         General: Alert, awake, in no acute distress. On high flow nasal cannula     Heart: Regular rate and rhythm. No murmur appreciated. Lungs: Decreased air movement, scattered crackles Abdomen: Soft, nontender, nondistended, positive bowel sounds.  Ext: Trace edema Skin: Warm and dry Neuro: Grossly intact, nonfocal.   Vital Signs: BP 123/74   Pulse 73   Temp 99.7 F (37.6 C) (Axillary)   Resp (!) 22   Ht 6\' 1"  (1.854 m)   Wt 64.5 kg   SpO2 99%   BMI 18.76 kg/m  SpO2: SpO2: 99 %  O2 Device: O2 Device: High Flow Nasal Cannula O2 Flow Rate: O2 Flow Rate (L/min): 30 L/min  Intake/output summary:   Intake/Output Summary (Last 24 hours) at 01/06/2020 1430 Last data filed at 01/06/2020 1300 Gross per 24 hour  Intake 1290 ml  Output 800 ml  Net 490 ml   LBM: Last BM Date: 01/05/20 Baseline Weight: Weight: 61.7 kg Most recent weight: Weight: 64.5 kg       Palliative Assessment/Data: PPS 40%      Patient Active Problem List   Diagnosis Date Noted  . Generalized pain   . General weakness   . Shortness of breath   . Hemorrhagic shock ([place])   . SOB (shortness of breath)   . Hypoxia   . Protein-calorie malnutrition, severe 12/25/2019  . Gastrointestinal hemorrhage 12/24/2019  . Acute blood loss anemia   . Pressure ulcer 12/08/2019  . Depression, recurrent ([place])   . Acute respiratory failure with hypoxia ([place])   . Goals of care, counseling/discussion   . Palliative care by specialist   . Acute exacerbation of CHF (congestive heart failure) ([place]) 11/13/2019  . Elevated troponin   . Chronic atrial fibrillation ([place])   . Weakness generalized   . DNR (do not resuscitate)   . Acute on chronic combined systolic (congestive) and diastolic (congestive) heart failure ([place]) 04/27/2019  . COPD with acute exacerbation ([place]) 08/01/2018  . Pulmonary edema 07/30/2018  . Health care maintenance 05/09/2018  . Gastritis and gastroduodenitis   . Diverticulum of duodenum   . Aortic root dilatation ([place])   . 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  Atherosclerosis of aorta ([place]) 08/11/2016  . Orthostatic hypotension 09/26/2015  . Near syncope 09/24/2015  . Syncope 09/24/2015  . Hepatic cirrhosis ([place]) 09/25/2014  . History of colonic polyps 09/25/2014  . Serum total bilirubin elevated 06/30/2014  . Follow-up examination, following unspecified surgery 01/27/2014  . Lung cancer ([place]) 11/29/2013  . Prosthetic valve dysfunction   . Chronic combined systolic and diastolic CHF (congestive heart failure) ([place])   . Type 2 diabetes mellitus ([place]) 11/04/2013  . Myocardial infarction ([place]) 11/01/2013  . Cardiomyopathy, nonischemic ([place]) 11/01/2013  . Acute on chronic respiratory failure with hypoxia ([place]) 08/15/2013  . H/O intracranial hemorrhage 08/13/2013  . H/O endocarditis 08/13/2013  . H/O: CVA (cerebrovascular accident) 08/13/2013  . Community acquired pneumonia 08/13/2013  . Chronic respiratory failure assoc with chf/ PAH 08/12/2013  . H/O atrioventricular nodal ablation   . S/P MVR (mitral valve replacement)   . Hypertension associated with diabetes ([place])   . Hyperlipidemia associated with type 2 diabetes mellitus ([place])   . Permanent atrial fibrillation   . Ejection fraction < 50%   . ICD (implantable cardioverter-defibrillator), biventricular, in situ   . Renal artery stenosis ([place])   . Pulmonary hypertension ([place])   . Primary cancer of left upper lobe of lung ([place]) 04/20/2009  . COLONIC POLYPS, ADENOMATOUS 03/22/2007  . COPD GOLD II 01/11/2007  . GERD 01/11/2007    Palliative Care Assessment & Plan   Patient Profile: 74 y.o.malewith past medical history of combined CHF with EF 25%, status post AICD, severe COPD/emphysema, chronic respiratory failure on 5 L at skilled facility, permanent A. fib on ASA, intercranial hemorrhage, mitral regurg status post bioprosthetic valve replacement, non-small cell lung cancer status post wedge resection, DM type II, hypertension, hyperlipidemiaadmitted on 6/15/2021with abdominal pain  and maroon-colored stool. Of note he recently had CT of abdomen pelvis that showed possible cecal mass. He has declined  consideration for colonoscopy as he been told in the past he would be high risk for any sort of sedating procedure.  Also, he has had increasing oxygen needs and is wearing 12 L nasal cannula as well as intermittent nonrebreather which is up from his baseline of 5 L nasal cannula outside of the hospital.Palliative consulted for goals of care.  Recommendations/Plan:  Continue to optimize current medical treatment  Continue DNR/DNI  Continue as needed oxycodone/acetaminophen 5/325 for pain and can also use if needed for shortness of breath. Continue current interventions.   Family and patient are in agreement for discharge to [facility] with outpatient Palliative Care to follow - TOC consulted by Dr. Reesa Chew     Code Status:    Code Status Orders  (From admission, onward)         Start     Ordered   12/24/19 0843  Do not attempt resuscitation (DNR)  Continuous       Question Answer Comment  In the event of cardiac or respiratory ARREST Do not call a "code blue"   In the event of cardiac or respiratory ARREST Do not perform Intubation, CPR, defibrillation or ACLS   In the event of cardiac or respiratory ARREST Use medication by any route, position, wound care, and other measures to relive pain and suffering. May use oxygen, suction and manual treatment of airway obstruction as needed for comfort.      12/24/19 0842        Code Status History    Date Active Date Inactive Code Status Order ID Comments User Context   12/17/2019 1137 12/24/2019 0515 DNR [id number]  Gayland Curry, DO Outpatient   12/06/2019 2335 12/13/2019 0055 DNR [id number]  Lenore Cordia, MD ED   11/13/2019 2118 11/25/2019 1857 DNR [id number]  Truddie Hidden, MD ED   11/13/2019 2114 11/13/2019 2118 Full Code [id number]  Truddie Hidden, MD ED   06/07/2019 1321 06/10/2019 2108 DNR [id number]  Karmen Bongo, MD ED     04/27/2019 1937 05/02/2019 1915 DNR [id number]  Lenore Cordia, MD ED   07/30/2018 1722 08/02/2018 1814 DNR [id number]  Cristal Deer, MD Inpatient   09/24/2015 1730 09/27/2015 1751 Full Code [id number]  Kelvin Cellar, MD Inpatient   11/29/2013 1523 12/03/2013 1401 Full Code [id number]  [name], MD Inpatient   08/12/2013 1804 08/18/2013 1820 Full Code [id number]  Barrett, Evelene Croon, PA-C Inpatient   Advance Care Planning Activity    Advance Directive Documentation     Most Recent Value  Type of Advance Directive Out of facility DNR (pink MOST or yellow form)  Pre-existing out of facility DNR order (yellow form or pink MOST form) Pink MOST form placed in chart (order not valid for inpatient use)  "MOST" Form in Place? --       Prognosis:  Guarded, unable to determine  Discharge Planning:  LTAC with outpatient Palliative Care to follow  Care plan was discussed with IDT  Thank you for allowing the Palliative Medicine Team to assist in the care of this patient.   Time In: 5462 Time Out: 0100 Total Time 15 minutes Prolonged Time Billed No      Greater than 50%  of this time was spent counseling and coordinating care related to the above assessment and plan.  Amber Koren Bound, NP  Loistine Chance MD  Please contact Palliative Medicine Team phone at [phone] for questions and concerns.

## 2020-01-06 NOTE — Plan of Care (Signed)
Care plan resolved.

## 2020-01-06 NOTE — TOC Progression Note (Signed)
Transition of Care [facility]) - Progression Note    Patient Details  Name: [name] MRN: [id number] Date of Birth: 1945/08/03  [facility]) CM/SW Contact  Lennart Pall, LCSW Phone Number: 01/06/2020, 2:37 PM  Clinical Narrative:   Spoke with pt and wife today about MD recommendation for LTAC.  Both agreeable.  Have spoken with Jennette Kathee Delton) and [facility] rep Doristine Johns) who will review and follow up with TOC if they can offer.      Expected Discharge Plan: [place] Barriers to Discharge: Continued Medical Work up  Expected Discharge Plan and Services Expected Discharge Plan: [place]   Discharge Planning Services: CM Consult Post Acute Care Choice: [place] Living arrangements for the past 2 months: [place]                                       Social Determinants of Health (SDOH) Interventions    Readmission Risk Interventions Readmission Risk Prevention Plan 11/25/2019 11/19/2019  Transportation Screening Complete Complete  PCP or Specialist Appt within 3-5 Days Complete Complete  HRI or [place] Complete Complete  Social Work Consult for [place] Planning/Counseling Complete Complete  Palliative Care Screening Complete Complete  Medication Review Press photographer) Complete Complete  Some recent data might be hidden

## 2020-01-06 NOTE — Progress Notes (Signed)
PROGRESS NOTE    [name]  YQI:[id number] DOB: 09/06/1945 DOA: 12/24/2019 PCP: [name], NP   Brief Narrative:  74 year old with history of CHF EF 25% status post BiV ICD, severe COPD, respiratory failure, permanent A. fib status post AV node ablation, ICH, bioprosthetic MVR, DM2, NSCLC s/p resection HTN admitted for GI bleed to have cecal mass complicated by CHF exacerbation. CTA abd= neg for GI bleed. Family doesn't want EGD or C scope. Currently PRN transfusions and lasix. Palliative team following for now.   Assessment & Plan:   Principal Problem:   SOB (shortness of breath) Active Problems:   Lung cancer (HCC)   Chronic atrial fibrillation (HCC)   Acute exacerbation of CHF (congestive heart failure) (HCC)   Pressure ulcer   Gastrointestinal hemorrhage   Acute blood loss anemia   Protein-calorie malnutrition, severe   Hemorrhagic shock (HCC)   Hypoxia   Generalized pain   General weakness   Shortness of breath  Acute on chronic combined systolic and diastolic CHF with an EF of 25 to 30% status post AICD and pacemaker CAD Permanent A fib,  WHO group 2 and 3 pulmonary HTN HLD. Minimal improvement still on high oxygen.  Currently rate controlled.  Not on anticoagulation due to bleeding risk and history of ICH Lasix 80 mg daily p.o. Deactivated defibrillator function, continue BiV pacing. BiPAP as necessary. CT chest shows bilateral pleural effusions.  Lower GI bleeding with concern for cecal mass noted on CT abd/pelvis 12/06/19. Does not want any endoscopic evaluation at this time.  Palliative care team following.  Transfuse as necessary  Suprapubic pain Resolved  Urine cultures positive for Enterococcus faecalis -Denies any urinary symptoms.  We will hold off on antibiotic treatment and clinically monitor  Acute on chronic hypoxic respiratory failure, worsening and on heated high flow nasal cannula secondary to severe COPD/emphysema History of non-small  cell lung cancer status post VATS Continues to be short of breath, minimal improvement.  Continue aggressive scheduled and as needed bronchodilators.  I-S/flutter  DM type II poorly controlled with hyperglycemia. Metformin on hold currently on insulin sliding scale and Accu-Chek.  Hx of Depression. Citalopram 10 mg daily  BPH, urine retention. Bethanechol 10 mg po TID, Tamsuolsin 0.4 po Daily  Deconditioning. PT/OT once medically stable  History of alcoholic cirrhosis Supportive care.  Overall appears to be compensated from a hepatic standpoint  Goals of care. Palliative care team following, currently patient is DNR.  Continue management as planned above with as needed BiPAP.  Stage 1 Coccyx Ulcer, pOA -Continue Wound Care  Overall poor prognosis, patient understands this.  Very minimal improvement with poor pulmonary reserve.  Spoke with the patient and his wife, they are agreeable for LTAC.  DVT prophylaxis: Place and maintain sequential compression device Start: 12/24/19 0843  Code Status: DNR Family Communication: Tammy at bedside  Status is: Inpatient  Remains inpatient appropriate because:Hemodynamically unstable   Dispo: The patient is from: Home              Anticipated d/c is to: To be determined              Anticipated d/c date is: > 3 days              Patient currently is not medically stable to d/c.  Still quite hypoxic on heated high flow.  Maintains in stepdown unit.  Will have TOC team to look into LTAC facilities  Subjective: Still feeling short of breath with  minimal exertion with frequent desaturation with minimal movements.  Denies any urinary symptoms.  Remains afebrile  Review of Systems Otherwise negative except as per HPI, including: General: Denies fever, chills, night sweats or unintended weight loss. Resp: Denies hemoptysis Cardiac: Denies chest pain, palpitations, orthopnea, paroxysmal nocturnal dyspnea. GI: Denies abdominal pain,  nausea, vomiting, diarrhea or constipation GU: Denies dysuria, frequency, hesitancy or incontinence MS: Denies muscle aches, joint pain or swelling Neuro: Denies headache, neurologic deficits (focal weakness, numbness, tingling), abnormal gait Psych: Denies anxiety, depression, SI/HI/AVH Skin: Denies new rashes or lesions ID: Denies sick contacts, exotic exposures, travel  Examination: Constitutional: Cachectic elderly frail with bilateral temporal wasting.  On heated high flow. Respiratory: Diffuse coarse breath sounds bilaterally Cardiovascular: Normal sinus rhythm, no rubs Abdomen: Nontender nondistended good bowel sounds Musculoskeletal: No edema noted Skin: No rashes seen Neurologic: CN 2-12 grossly intact.  And nonfocal Psychiatric: Normal judgment and insight. Alert and oriented x 3. Normal mood.   Objective: Vitals:   01/06/20 0800 01/06/20 0823 01/06/20 0900 01/06/20 0944  BP: (!) 119/27  (!) 86/26   Pulse: 78  (!) 49   Resp: (!) 21  (!) 26   Temp: 97.9 F (36.6 C)     TempSrc: Oral     SpO2: 96% 95% (!) 87% 90%  Weight:      Height:        Intake/Output Summary (Last 24 hours) at 01/06/2020 1038 Last data filed at 01/06/2020 0900 Gross per 24 hour  Intake 1530 ml  Output 800 ml  Net 730 ml   Filed Weights   01/02/20 0453 01/03/20 0406 01/06/20 0500  Weight: 67.9 kg 68.3 kg 64.5 kg     Data Reviewed:   CBC: Recent Labs  Lab 12/31/19 0210 12/31/19 0210 01/01/20 0201 01/01/20 0201 01/02/20 0150 01/03/20 0217 01/04/20 0259 01/05/20 0347 01/06/20 0335  WBC 10.1   < > 9.7   < > 10.1 10.2 9.8 10.5 10.7*  NEUTROABS 8.0*  --  7.9*  --   --   --   --   --   --   HGB 8.0*   < > 8.0*   < > 7.6* 7.8* 8.1* 8.5* 7.5*  HCT 26.0*   < > 26.4*   < > 25.0* 26.1* 26.9* 27.8* 25.5*  MCV 86.1   < > 86.3   < > 86.5 85.0 86.5 87.4 85.3  PLT 242   < > 263   < > 261 248 259 264 233   < > = values in this interval not displayed.   Basic Metabolic Panel: Recent Labs    Lab 12/31/19 0210 12/31/19 0210 01/01/20 0201 01/01/20 0201 01/02/20 0150 01/03/20 0217 01/04/20 0259 01/05/20 0347 01/06/20 0335  NA 135   < > 133*   < > 132* 132* 131* 132* 133*  K 3.9   < > 3.8   < > 3.4* 4.1 4.4 4.1 3.6  CL 93*   < > 91*   < > 92* 92* 90* 92* 93*  CO2 31   < > 31   < > 31 32 30 34* 32  GLUCOSE 126*   < > 161*   < > 168* 176* 180* 179* 166*  BUN 23   < > 25*   < > 22 23 23  25* 20  CREATININE 0.69   < > 0.81   < > 0.68 0.75 0.80 0.82 0.73  CALCIUM 7.8*   < > 7.8*   < >  7.9* 7.9* 7.8* 8.0* 7.7*  MG 1.9   < > 2.0   < > 1.9 2.1 2.1 2.1 2.2  PHOS 3.7  --  4.0  --   --   --   --   --   --    < > = values in this interval not displayed.   GFR: Estimated Creatinine Clearance: 76.1 mL/min (by C-G formula based on SCr of 0.73 mg/dL). Liver Function Tests: Recent Labs  Lab 12/31/19 0210 01/01/20 0201  AST 9* 11*  ALT 7 8  ALKPHOS 61 67  BILITOT 1.5* 1.7*  PROT 5.5* 5.7*  ALBUMIN 2.5* 2.4*   No results for input(s): LIPASE, AMYLASE in the last 168 hours. No results for input(s): AMMONIA in the last 168 hours. Coagulation Profile: No results for input(s): INR, PROTIME in the last 168 hours. Cardiac Enzymes: No results for input(s): CKTOTAL, CKMB, CKMBINDEX, TROPONINI in the last 168 hours. BNP (last 3 results) No results for input(s): PROBNP in the last 8760 hours. HbA1C: No results for input(s): HGBA1C in the last 72 hours. CBG: Recent Labs  Lab 01/05/20 0817 01/05/20 1154 01/05/20 1637 01/05/20 2149 01/06/20 0749  GLUCAP 158* 213* 179* 146* 149*   Lipid Profile: No results for input(s): CHOL, HDL, LDLCALC, TRIG, CHOLHDL, LDLDIRECT in the last 72 hours. Thyroid Function Tests: No results for input(s): TSH, T4TOTAL, FREET4, T3FREE, THYROIDAB in the last 72 hours. Anemia Panel: No results for input(s): VITAMINB12, FOLATE, FERRITIN, TIBC, IRON, RETICCTPCT in the last 72 hours. Sepsis Labs: Recent Labs  Lab 01/02/20 0150  PROCALCITON <0.10     Recent Results (from the past 240 hour(s))  Culture, Urine     Status: Abnormal   Collection Time: 01/04/20  2:33 PM   Specimen: Urine, Catheterized  Result Value Ref Range Status   Specimen Description   Final    URINE, CATHETERIZED Performed at [facility] [address], [place], [place] [address]    Special Requests   Final    Normal Performed at [facility], [place] [address], [place], [place] [address]    Culture >=100,000 COLONIES/mL ENTEROCOCCUS FAECALIS (A)  Final   Report Status 01/06/2020 FINAL  Final   Organism ID, Bacteria ENTEROCOCCUS FAECALIS (A)  Final      Susceptibility   Enterococcus faecalis - MIC*    AMPICILLIN <=2 SENSITIVE Sensitive     NITROFURANTOIN <=16 SENSITIVE Sensitive     VANCOMYCIN 2 SENSITIVE Sensitive     * >=100,000 COLONIES/mL ENTEROCOCCUS FAECALIS         Radiology Studies: No results found.      Scheduled Meds: . sodium chloride   Intravenous Once  . bethanechol  10 mg Oral TID  . budesonide (PULMICORT) nebulizer solution  0.5 mg Nebulization BID  . Chlorhexidine Gluconate Cloth  6 each Topical Daily  . citalopram  10 mg Oral Daily  . dextromethorphan-guaiFENesin  1 tablet Oral BID  . digoxin  0.125 mg Oral Daily  . feeding supplement  1 Container Oral BID BM  . furosemide  80 mg Oral Daily  . insulin aspart  0-5 Units Subcutaneous QHS  . insulin aspart  0-9 Units Subcutaneous TID WC  . ipratropium-albuterol  3 mL Nebulization TID  . mouth rinse  15 mL Mouth Rinse BID  . pantoprazole  40 mg Oral Daily  . tamsulosin  0.4 mg Oral QPC supper  . cyanocobalamin  1,000 mcg Oral Daily   Continuous Infusions:  LOS: 13 days   Time spent= 35 mins    Jacques Willingham Arsenio Loader, MD [facility]  If 7PM-7AM, please contact night-coverage  01/06/2020, 10:38 AM

## 2020-01-06 NOTE — Consult Note (Signed)
   [facility] CM Inpatient Consult   01/06/2020  [name] [birth year] [id number]  Patient chart has been reviewed for readmissions less than 30 days and for high risk score for unplanned readmissions.  Patient assessed for community [place] [facility] follow up needs.  Chart review reveals current disposition plan discussion is for LTAC. No [facility] CM identifiable needs at this time.  Netta Cedars, MSN, [facility] Liaison Nurse Mobile Phone [phone]  Toll free office [phone]

## 2020-01-07 ENCOUNTER — Ambulatory Visit: Payer: Medicare Other | Admitting: Podiatry

## 2020-01-07 LAB — GLUCOSE, CAPILLARY
Glucose-Capillary: 149 mg/dL — ABNORMAL HIGH (ref 70–99)
Glucose-Capillary: 148 mg/dL — ABNORMAL HIGH (ref 70–99)
Glucose-Capillary: 183 mg/dL — ABNORMAL HIGH (ref 70–99)
Glucose-Capillary: 169 mg/dL — ABNORMAL HIGH (ref 70–99)

## 2020-01-07 LAB — BASIC METABOLIC PANEL
Anion gap: 10 (ref 5–15)
BUN: 22 mg/dL (ref 8–23)
CO2: 29 mmol/L (ref 22–32)
Calcium: 7.9 mg/dL — ABNORMAL LOW (ref 8.9–10.3)
Chloride: 91 mmol/L — ABNORMAL LOW (ref 98–111)
Creatinine, Ser: 0.78 mg/dL (ref 0.61–1.24)
GFR calc Af Amer: >60 mL/min (ref >60)
GFR calc non Af Amer: >60 mL/min (ref >60)
Glucose, Bld: 172 mg/dL — ABNORMAL HIGH (ref 70–99)
Potassium: 4.3 mmol/L (ref 3.5–5.1)
Sodium: 130 mmol/L — ABNORMAL LOW (ref 135–145)

## 2020-01-07 LAB — MAGNESIUM: Magnesium: 2 mg/dL (ref 1.7–2.4)

## 2020-01-07 NOTE — Progress Notes (Signed)
Nutrition Follow-up  DOCUMENTATION CODES:   Severe malnutrition in context of chronic illness, Underweight  INTERVENTION:   -Boost Breeze po BID, each supplement provides 250 kcal and 9 grams of protein -Magic cup once with meals, each supplement provides 290 kcal and 9 grams of protein  NUTRITION DIAGNOSIS:   Severe Malnutrition related to chronic illness (COPD and lung cancer) as evidenced by moderate fat depletion, moderate muscle depletion, severe fat depletion, severe muscle depletion.  Ongoing.  GOAL:   Patient will meet greater than or equal to 90% of their needs  Progressing.  MONITOR:   PO intake, Supplement acceptance, Labs, Weight trends   ASSESSMENT:   74 yo male with medical history of HTN, endocarditis, dyslipidemia, septal defect, pulmonary HTN, CVA  without residual deficits, afib, s/p mitral valve replacement, CHF, COPD, MI, DM, and lung cancer (NSCLC). He presented from rehab center with abdominal pain and maroon colored stool. He was admitted to ICU. CT abdomen/pelvis done on 5/28 showed possible cecal mass.  Patient continues to have SOB. Last documented intake was 100% of breakfast on 6/24. Pt is drinking Boost Breeze supplements.   Admission weight: 136 lbs. Current weight: 147 lbs.   I/Os: -14L since admit UOP: 1650 ml x 24 hrs  Medications: Lasix, Vitamin B-12 Labs reviewed: CBGs; 148-149 Low Na  Diet Order:   Diet Order            Diet Heart Room service appropriate? Yes; Fluid consistency: Thin; Fluid restriction: 1800 mL Fluid  Diet effective now                 EDUCATION NEEDS:   No education needs have been identified at this time  Skin:  Skin Assessment: Skin Integrity Issues: Skin Integrity Issues:: Stage I, Stage II Stage I: L + R coccyx Stage II: L ear (newly documented 6/19)  Last BM:  6/22  Height:   Ht Readings from Last 1 Encounters:  12/24/19 6\' 1"  (1.854 m)    Weight:   Wt Readings from Last 1 Encounters:   01/07/20 67 kg   BMI:  Body mass index is 19.49 kg/m.  Estimated Nutritional Needs:   Kcal:  1850-2040 kcal  Protein:  90-105 grams  Fluid:  >/= 2 L/day   Clayton Bibles, MS, RD, LDN Inpatient Clinical Dietitian Contact information available via Amion

## 2020-01-07 NOTE — TOC Progression Note (Addendum)
Transition of Care [facility]) - Progression Note    Patient Details  Name: [name] MRN: [id number] Date of Birth: 1946-02-06  Transition of Care [facility]) CM/SW Contact  Leeroy Cha, RN Phone Number: 01/07/2020, 9:40 AM  Clinical Narrative:    Wife has selected [facility] for ltach, per Barrett Shell will have a bed on 063021/MD Amin notified at 1335. Both ltach's are engaged and working up case . Continues to require hfnc at 35l/min, urine culture + enterococcus faecalis.   Expected Discharge Plan: Long Term Acute Care (LTAC) Barriers to Discharge: Continued Medical Work up  Expected Discharge Plan and Services Expected Discharge Plan: [place] (LTAC)   Discharge Planning Services: CM Consult Post Acute Care Choice: [place] Living arrangements for the past 2 months: [employer]                                       Social Determinants of Health (SDOH) Interventions    Readmission Risk Interventions Readmission Risk Prevention Plan 11/25/2019 11/19/2019  Transportation Screening Complete Complete  PCP or Specialist Appt within 3-5 Days Complete Complete  HRI or Home Care Consult Complete Complete  Social Work Consult for [place] Planning/Counseling Complete Complete  Palliative Care Screening Complete Complete  Medication Review [employer]) Complete Complete  Some recent data might be hidden

## 2020-01-07 NOTE — Progress Notes (Signed)
CRITICAL VALUE ALERT  Critical Value:  Na 130  Date & Time Notied:  01-07-20 @ 4276  Provider Notified: Dr. Nichola Sizer  Orders Received/Actions taken: No new orders yet

## 2020-01-07 NOTE — Progress Notes (Signed)
PROGRESS NOTE    [name]  ZOX:[id number] DOB: [birth year] DOA: 12/24/2019 PCP: [name], NP   Brief Narrative:  74 year old with history of CHF EF 25% status post BiV ICD, severe COPD, respiratory failure, permanent A. fib status post AV node ablation, ICH, bioprosthetic MVR, DM2, NSCLC s/p resection HTN admitted for GI bleed to have cecal mass complicated by CHF exacerbation. CTA abd= neg for GI bleed. Family doesn't want EGD or C scope. Currently PRN transfusions and lasix. Palliative team following for now.  Due to failure of much improvement, patient was offered home hospice versus LTAC.  He has decided to go to [facility].  Case has been referred to the facility.  Assessment & Plan:   Principal Problem:   SOB (shortness of breath) Active Problems:   Lung cancer (HCC)   Chronic atrial fibrillation (HCC)   Acute exacerbation of CHF (congestive heart failure) (HCC)   Pressure ulcer   Gastrointestinal hemorrhage   Acute blood loss anemia   Protein-calorie malnutrition, severe   Hemorrhagic shock (HCC)   Hypoxia   Generalized pain   General weakness   Shortness of breath  Acute on chronic combined systolic and diastolic CHF with an EF of 25 to 30% status post AICD and pacemaker CAD Permanent A fib,  WHO group 2 and 3 pulmonary HTN HLD. No improvement in oxygen requirement.  Currently rate controlled.  Not on anticoagulation due to bleeding risk and history of ICH Continue daily 80 mg Lasix p.o. Deactivated defibrillator function, continue BiV pacing. BiPAP as necessary. CT chest shows bilateral pleural effusions.  Lower GI bleeding with concern for cecal mass noted on CT abd/pelvis 12/06/19. Does not want any endoscopic evaluation at this time.  Palliative care team following.  Transfuse as needed  Suprapubic pain Resolved  Urine cultures positive for Enterococcus faecalis -Denies any urinary symptoms.  We will hold off on antibiotic treatment and clinically  monitor  Acute on chronic hypoxic respiratory failure, worsening and on heated high flow nasal cannula secondary to severe COPD/emphysema History of non-small cell lung cancer status post VATS Continues to be short of breath, minimal improvement.  Continue aggressive scheduled and as needed bronchodilators.  I-S/flutter  DM type II poorly controlled with hyperglycemia. Metformin on hold currently on insulin sliding scale and Accu-Chek.  Hx of Depression. Citalopram 10 mg daily  BPH, urine retention. Bethanechol 10 mg po TID, Tamsuolsin 0.4 po Daily  Deconditioning. PT/OT once medically stable  History of alcoholic cirrhosis Supportive care.  Overall appears to be compensated from a hepatic standpoint  Goals of care. Palliative care team following, currently patient is DNR.  Continue management as planned above with as needed BiPAP.  Stage 1 Coccyx Ulcer, pOA -Continue Wound Care  Overall poor prognosis, patient understands this.  He has very poor pulmonary reserve.  After discussing with the patient and wife-decided to go to LTAC.  DVT prophylaxis: Place and maintain sequential compression device Start: 12/24/19 0843  Code Status: DNR Family Communication: None at bedside  Status is: Inpatient  Remains inpatient appropriate because:Hemodynamically unstable   Dispo: The patient is from: Home              Anticipated d/c is to: LTAC              Anticipated d/c date is: 1 day              Patient currently is not medically stable to d/c.  Still remains on heated high flow.  Will need LTAC, pending case review  Subjective: Patient tells me does not feel any better than yesterday still has exertional shortness of breath.  He understands his prognosis overall poor  Review of Systems Otherwise negative except as per HPI, including: General: Denies fever, chills, night sweats or unintended weight loss. Resp: Denies hemoptysis Cardiac: Denies chest pain,  palpitations, orthopnea, paroxysmal nocturnal dyspnea. GI: Denies abdominal pain, nausea, vomiting, diarrhea or constipation GU: Denies dysuria, frequency, hesitancy or incontinence MS: Denies muscle aches, joint pain or swelling Neuro: Denies headache, neurologic deficits (focal weakness, numbness, tingling), abnormal gait Psych: Denies anxiety, depression, SI/HI/AVH Skin: Denies new rashes or lesions ID: Denies sick contacts, exotic exposures, travel  Examination: Constitutional: Cachectic elderly frail with bilateral temporal wasting.  On heated high flow. Respiratory: Diffuse coarse breath sounds bilaterally Cardiovascular: Normal sinus rhythm, no rubs Abdomen: Nontender nondistended good bowel sounds Musculoskeletal: No edema noted Skin: No rashes seen Neurologic: CN 2-12 grossly intact.  And nonfocal Psychiatric: Normal judgment and insight. Alert and oriented x 3. Normal mood.   Objective: Vitals:   01/07/20 0400 01/07/20 0700 01/07/20 0800 01/07/20 0822  BP: (!) 124/48     Pulse: 74 73 74   Resp: 12 (!) 22 19   Temp:   99.4 F (37.4 C)   TempSrc:   Axillary   SpO2: 94% 92% (!) 83% (!) 89%  Weight:      Height:        Intake/Output Summary (Last 24 hours) at 01/07/2020 1007 Last data filed at 01/07/2020 0600 Gross per 24 hour  Intake 1080 ml  Output 1650 ml  Net -570 ml   Filed Weights   01/03/20 0406 01/06/20 0500 01/07/20 0356  Weight: 68.3 kg 64.5 kg 67 kg     Data Reviewed:   CBC: Recent Labs  Lab 01/01/20 0201 01/01/20 0201 01/02/20 0150 01/03/20 0217 01/04/20 0259 01/05/20 0347 01/06/20 0335  WBC 9.7   < > 10.1 10.2 9.8 10.5 10.7*  NEUTROABS 7.9*  --   --   --   --   --   --   HGB 8.0*   < > 7.6* 7.8* 8.1* 8.5* 7.5*  HCT 26.4*   < > 25.0* 26.1* 26.9* 27.8* 25.5*  MCV 86.3   < > 86.5 85.0 86.5 87.4 85.3  PLT 263   < > 261 248 259 264 233   < > = values in this interval not displayed.   Basic Metabolic Panel: Recent Labs  Lab  01/01/20 0201 01/02/20 0150 01/03/20 0217 01/04/20 0259 01/05/20 0347 01/06/20 0335 01/07/20 0301  NA 133*   < > 132* 131* 132* 133* 130*  K 3.8   < > 4.1 4.4 4.1 3.6 4.3  CL 91*   < > 92* 90* 92* 93* 91*  CO2 31   < > 32 30 34* 32 29  GLUCOSE 161*   < > 176* 180* 179* 166* 172*  BUN 25*   < > 23 23 25* 20 22  CREATININE 0.81   < > 0.75 0.80 0.82 0.73 0.78  CALCIUM 7.8*   < > 7.9* 7.8* 8.0* 7.7* 7.9*  MG 2.0   < > 2.1 2.1 2.1 2.2 2.0  PHOS 4.0  --   --   --   --   --   --    < > = values in this interval not displayed.   GFR: Estimated Creatinine Clearance: 79.1 mL/min (by C-G formula based on SCr of 0.78  mg/dL). Liver Function Tests: Recent Labs  Lab 01/01/20 0201  AST 11*  ALT 8  ALKPHOS 67  BILITOT 1.7*  PROT 5.7*  ALBUMIN 2.4*   No results for input(s): LIPASE, AMYLASE in the last 168 hours. No results for input(s): AMMONIA in the last 168 hours. Coagulation Profile: No results for input(s): INR, PROTIME in the last 168 hours. Cardiac Enzymes: No results for input(s): CKTOTAL, CKMB, CKMBINDEX, TROPONINI in the last 168 hours. BNP (last 3 results) No results for input(s): PROBNP in the last 8760 hours. HbA1C: No results for input(s): HGBA1C in the last 72 hours. CBG: Recent Labs  Lab 01/06/20 0749 01/06/20 1230 01/06/20 1712 01/06/20 2128 01/07/20 0833  GLUCAP 149* 206* 127* 148* 149*   Lipid Profile: No results for input(s): CHOL, HDL, LDLCALC, TRIG, CHOLHDL, LDLDIRECT in the last 72 hours. Thyroid Function Tests: No results for input(s): TSH, T4TOTAL, FREET4, T3FREE, THYROIDAB in the last 72 hours. Anemia Panel: No results for input(s): VITAMINB12, FOLATE, FERRITIN, TIBC, IRON, RETICCTPCT in the last 72 hours. Sepsis Labs: Recent Labs  Lab 01/02/20 0150  PROCALCITON <0.10    Recent Results (from the past 240 hour(s))  Culture, Urine     Status: Abnormal   Collection Time: 01/04/20  2:33 PM   Specimen: Urine, Catheterized  Result Value Ref  Range Status   Specimen Description   Final    URINE, CATHETERIZED Performed at [facility] [address], [place], [place] [address]    Special Requests   Final    Normal Performed at [facility], [place] [address], [place], [place] [address]    Culture >=100,000 COLONIES/mL ENTEROCOCCUS FAECALIS (A)  Final   Report Status 01/06/2020 FINAL  Final   Organism ID, Bacteria ENTEROCOCCUS FAECALIS (A)  Final      Susceptibility   Enterococcus faecalis - MIC*    AMPICILLIN <=2 SENSITIVE Sensitive     NITROFURANTOIN <=16 SENSITIVE Sensitive     VANCOMYCIN 2 SENSITIVE Sensitive     * >=100,000 COLONIES/mL ENTEROCOCCUS FAECALIS         Radiology Studies: No results found.      Scheduled Meds: . bethanechol  10 mg Oral TID  . budesonide (PULMICORT) nebulizer solution  0.5 mg Nebulization BID  . Chlorhexidine Gluconate Cloth  6 each Topical Daily  . citalopram  10 mg Oral Daily  . dextromethorphan-guaiFENesin  1 tablet Oral BID  . digoxin  0.125 mg Oral Daily  . feeding supplement  1 Container Oral BID BM  . furosemide  80 mg Oral Daily  . insulin aspart  0-5 Units Subcutaneous QHS  . insulin aspart  0-9 Units Subcutaneous TID WC  . ipratropium-albuterol  3 mL Nebulization TID  . mouth rinse  15 mL Mouth Rinse BID  . pantoprazole  40 mg Oral Daily  . tamsulosin  0.4 mg Oral QPC supper  . cyanocobalamin  1,000 mcg Oral Daily   Continuous Infusions:   LOS: 14 days   Time spent= 25 mins    Ayham Word Arsenio Loader, MD [facility]  If 7PM-7AM, please contact night-coverage  01/07/2020, 10:07 AM

## 2020-01-08 DIAGNOSIS — C3490 Malignant neoplasm of unspecified part of unspecified bronchus or lung: Secondary | ICD-10-CM

## 2020-01-08 LAB — GLUCOSE, CAPILLARY
Glucose-Capillary: 144 mg/dL — ABNORMAL HIGH (ref 70–99)
Glucose-Capillary: 242 mg/dL — ABNORMAL HIGH (ref 70–99)
Glucose-Capillary: 165 mg/dL — ABNORMAL HIGH (ref 70–99)
Glucose-Capillary: 153 mg/dL — ABNORMAL HIGH (ref 70–99)

## 2020-01-08 LAB — CBC
HCT: 25.8 % — ABNORMAL LOW (ref 39.0–52.0)
Hemoglobin: 7.7 g/dL — ABNORMAL LOW (ref 13.0–17.0)
MCH: 25.5 pg — ABNORMAL LOW (ref 26.0–34.0)
MCHC: 29.8 g/dL — ABNORMAL LOW (ref 30.0–36.0)
MCV: 85.4 fL (ref 80.0–100.0)
Platelets: 258 10*3/uL (ref 150–400)
RBC: 3.02 MIL/uL — ABNORMAL LOW (ref 4.22–5.81)
RDW: 19.7 % — ABNORMAL HIGH (ref 11.5–15.5)
WBC: 12.1 10*3/uL — ABNORMAL HIGH (ref 4.0–10.5)
nRBC: 0 % (ref 0.0–0.2)

## 2020-01-08 LAB — BASIC METABOLIC PANEL
Anion gap: 9 (ref 5–15)
BUN: 19 mg/dL (ref 8–23)
CO2: 32 mmol/L (ref 22–32)
Calcium: 8.1 mg/dL — ABNORMAL LOW (ref 8.9–10.3)
Chloride: 92 mmol/L — ABNORMAL LOW (ref 98–111)
Creatinine, Ser: 0.68 mg/dL (ref 0.61–1.24)
GFR calc Af Amer: >60 mL/min (ref >60)
GFR calc non Af Amer: >60 mL/min (ref >60)
Glucose, Bld: 161 mg/dL — ABNORMAL HIGH (ref 70–99)
Potassium: 4.1 mmol/L (ref 3.5–5.1)
Sodium: 133 mmol/L — ABNORMAL LOW (ref 135–145)

## 2020-01-08 LAB — MAGNESIUM: Magnesium: 2.1 mg/dL (ref 1.7–2.4)

## 2020-01-08 NOTE — Progress Notes (Addendum)
Daily Progress Note   Patient Name: [name]       Date: 01/08/2020 DOB: [birth year]  Age: 74 y.o. MRN#: [id number] Attending Physician: Little Ishikawa, MD Primary Care Physician: [name], NP Admit Date: 12/24/2019  Reason for Consultation/Follow-up: Establishing goals of care  Subjective: I met today with [name].  He reports that his wife has not been able to visit recently after having cataract surgery.    We discussed clinical course since I last saw him around a week ago.  Goals for this hospitalization and options for discharge discussed.  We discussed difference between an aggressive medical intervention path and a palliative, comfort focused care path.  Discussed options moving forward including LTACH vs consideration for residential hospice.  He reports that he knows he may not get better, but he wants to transition to [facility] to see if he improves.  He is concerned also with giving his wife time to heal to be able to return to visiting with him.  Questions and concerns addressed.   PMT will continue to support holistically.  Length of Stay: 15  Current Medications: Scheduled Meds:  . bethanechol  10 mg Oral TID  . budesonide (PULMICORT) nebulizer solution  0.5 mg Nebulization BID  . Chlorhexidine Gluconate Cloth  6 each Topical Daily  . citalopram  10 mg Oral Daily  . dextromethorphan-guaiFENesin  1 tablet Oral BID  . digoxin  0.125 mg Oral Daily  . feeding supplement  1 Container Oral BID BM  . furosemide  80 mg Oral Daily  . insulin aspart  0-5 Units Subcutaneous QHS  . insulin aspart  0-9 Units Subcutaneous TID WC  . ipratropium-albuterol  3 mL Nebulization TID  . mouth rinse  15 mL Mouth Rinse BID  . pantoprazole  40 mg Oral Daily  . tamsulosin  0.4 mg  Oral QPC supper  . cyanocobalamin  1,000 mcg Oral Daily    Continuous Infusions:   PRN Meds: albuterol, lip balm, liver oil-zinc oxide, ondansetron (ZOFRAN) IV, oxyCODONE-acetaminophen, polyethylene glycol, senna-docusate  Physical Exam         General: Awake, alert, NAD, on high flow [place]  Heart: Regular rate and rhythm. No murmur appreciated. Lungs: Decreased, scattered crackles  Ext: Trace edema Skin: Warm and dry Neuro: Grossly intact, nonfocal.   Vital  Signs: BP (!) 123/51   Pulse 76   Temp 98 F (36.7 C) (Oral)   Resp 17   Ht 6' 1"  (1.854 m)   Wt 66.4 kg   SpO2 (!) 85%   BMI 19.31 kg/m  SpO2: SpO2: (!) 85 % O2 Device: O2 Device: High Flow Nasal Cannula O2 Flow Rate: O2 Flow Rate (L/min): 30 L/min  Intake/output summary:   Intake/Output Summary (Last 24 hours) at 01/08/2020 1618 Last data filed at 01/08/2020 0230 Gross per 24 hour  Intake 1560 ml  Output 1875 ml  Net -315 ml   LBM: Last BM Date: 01/07/20 Baseline Weight: Weight: 61.7 kg Most recent weight: Weight: 66.4 kg       Palliative Assessment/Data: PPS 40%    Flowsheet Rows     Most Recent Value  Intake Tab  Referral Department Hospitalist  Unit at Time of Referral ICU  Date Notified 12/24/19  Palliative Care Type Return patient Palliative Care  Reason for referral Clarify Goals of Care  Date of Admission 12/24/19  Date first seen by Palliative Care 12/27/19  # of days Palliative referral response time 3 Day(s)  # of days IP prior to Palliative referral 0  Clinical Assessment  Psychosocial & Spiritual Assessment  Palliative Care Outcomes      Patient Active Problem List   Diagnosis Date Noted  . Generalized pain   . General weakness   . Shortness of breath   . Hemorrhagic shock ([place])   . SOB (shortness of breath)   . Hypoxia   . Protein-calorie malnutrition, severe 12/25/2019  . Gastrointestinal hemorrhage 12/24/2019  . Acute blood loss anemia   . Pressure ulcer 12/08/2019  .  Depression, recurrent ([place])   . Acute respiratory failure with hypoxia ([place])   . Goals of care, counseling/discussion   . Palliative care by specialist   . Acute exacerbation of CHF (congestive heart failure) ([place]) 11/13/2019  . Elevated troponin   . Chronic atrial fibrillation ([place])   . Weakness generalized   . DNR (do not resuscitate)   . Acute on chronic combined systolic (congestive) and diastolic (congestive) heart failure ([place]) 04/27/2019  . COPD with acute exacerbation ([place]) 08/01/2018  . Pulmonary edema 07/30/2018  . Health care maintenance 05/09/2018  . Gastritis and gastroduodenitis   . Diverticulum of duodenum   . Aortic root dilatation ([place])   . 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 Atherosclerosis of aorta ([place]) 08/11/2016  . Orthostatic hypotension 09/26/2015  . Near syncope 09/24/2015  . Syncope 09/24/2015  . Hepatic cirrhosis ([place]) 09/25/2014  . History of colonic polyps 09/25/2014  . Serum total bilirubin elevated 06/30/2014  . Follow-up examination, following unspecified surgery 01/27/2014  . Lung cancer ([place]) 11/29/2013  . Prosthetic valve dysfunction   . Chronic combined systolic and diastolic CHF (congestive heart failure) ([place])   . Type 2 diabetes mellitus ([place]) 11/04/2013  . Myocardial infarction ([place]) 11/01/2013  . Cardiomyopathy, nonischemic ([place]) 11/01/2013  . Acute on chronic respiratory failure with hypoxia ([place]) 08/15/2013  . H/O intracranial hemorrhage 08/13/2013  . H/O endocarditis 08/13/2013  . H/O: CVA (cerebrovascular accident) 08/13/2013  . Community acquired pneumonia 08/13/2013  . Chronic respiratory failure assoc with chf/ PAH 08/12/2013  . H/O atrioventricular nodal ablation   . S/P MVR (mitral valve replacement)   . Hypertension associated with diabetes ([place])   . Hyperlipidemia associated with type 2 diabetes mellitus ([place])   . Permanent atrial fibrillation   . Ejection fraction < 50%   .  ICD (implantable  cardioverter-defibrillator), biventricular, in situ   . Renal artery stenosis ([place])   . Pulmonary hypertension ([place])   . Primary cancer of left upper lobe of lung ([place]) 04/20/2009  . COLONIC POLYPS, ADENOMATOUS 03/22/2007  . COPD GOLD II 01/11/2007  . GERD 01/11/2007    Palliative Care Assessment & Plan   Patient Profile: 74 y.o.malewith past medical history of combined CHF with EF 25%, status post AICD, severe COPD/emphysema, chronic respiratory failure on 5 L at skilled facility, permanent A. fib on ASA, intercranial hemorrhage, mitral regurg status post bioprosthetic valve replacement, non-small cell lung cancer status post wedge resection, DM type II, hypertension, hyperlipidemiaadmitted on 6/15/2021with abdominal pain and maroon-colored stool. Of note he recently had CT of abdomen pelvis that showed possible cecal mass. He has declined consideration for colonoscopy as he been told in the past he would be high risk for any sort of sedating procedure.  Has continued to have increased O2 requirement and now on HFNC.  Palliative asked to revisit to discuss further regarding GOC and plan for LTACH vs transition to hospice services.  Recommendations/Plan:  Continue DNR/DNI  Continue current interventions including low dose opioids for pain or shortness of breath.  Discussed again options for discharge including continued evaluation for LTACH vs consideration for hospice services.  Following discussion today, he remains invested in plan for transition to [facility].    Code Status:    Code Status Orders  (From admission, onward)         Start     Ordered   12/24/19 0843  Do not attempt resuscitation (DNR)  Continuous       Question Answer Comment  In the event of cardiac or respiratory ARREST Do not call a "code blue"   In the event of cardiac or respiratory ARREST Do not perform Intubation, CPR, defibrillation or ACLS   In the event of cardiac or respiratory ARREST Use medication  by any route, position, wound care, and other measures to relive pain and suffering. May use oxygen, suction and manual treatment of airway obstruction as needed for comfort.      12/24/19 0842        Code Status History    Date Active Date Inactive Code Status Order ID Comments User Context   12/17/2019 1137 12/24/2019 0515 DNR [id number]  Gayland Curry, DO Outpatient   12/06/2019 2335 12/13/2019 0055 DNR [id number]  Lenore Cordia, MD ED   11/13/2019 2118 11/25/2019 1857 DNR [id number]  Truddie Hidden, MD ED   11/13/2019 2114 11/13/2019 2118 Full Code [id number]  Truddie Hidden, MD ED   06/07/2019 1321 06/10/2019 2108 DNR [id number]  Karmen Bongo, MD ED   04/27/2019 1937 05/02/2019 1915 DNR [id number]  Lenore Cordia, MD ED   07/30/2018 1722 08/02/2018 1814 DNR [id number]  Cristal Deer, MD Inpatient   09/24/2015 1730 09/27/2015 1751 Full Code [id number]  Kelvin Cellar, MD Inpatient   11/29/2013 1523 12/03/2013 1401 Full Code [id number]  [name], MD Inpatient   08/12/2013 1804 08/18/2013 1820 Full Code [id number]  Barrett, Evelene Croon, PA-C Inpatient   Advance Care Planning Activity    Advance Directive Documentation     Most Recent Value  Type of Advance Directive Out of facility DNR (pink MOST or yellow form)  Pre-existing out of facility DNR order (yellow form or pink MOST form) Pink MOST form placed in chart (order not valid for inpatient use)  "MOST" Form in Place? --  Prognosis:  Guarded, unable to determine  Discharge Planning:  LTAC with outpatient Palliative Care to follow  Care plan was discussed with IDT  Thank you for allowing the Palliative Medicine Team to assist in the care of this patient.   Time In: 1430 Time Out: 1500 Total Time 30 Prolonged Time Billed No      Greater than 50%  of this time was spent counseling and coordinating care related to the above assessment and plan.  Micheline Rough, MD    Please contact Palliative Medicine Team phone at [phone]  for questions and concerns.

## 2020-01-08 NOTE — TOC Progression Note (Signed)
Transition of Care [facility]) - Progression Note    Patient Details  Name: JALIN ALICEA MRN: [id number] Date of Birth: 05-26-1946  Transition of Care [facility]) CM/SW Contact  Leeroy Cha, RN Phone Number: 01/08/2020, 11:04 AM  Clinical Narrative:    Awaiting bed placement at [facility].   Expected Discharge Plan: Long Term Acute Care (LTAC) Barriers to Discharge: Continued Medical Work up  Expected Discharge Plan and Services Expected Discharge Plan: [place] (LTAC)   Discharge Planning Services: CM Consult Post Acute Care Choice: [place] Living arrangements for the past 2 months: [place]                                       Social Determinants of Health (SDOH) Interventions    Readmission Risk Interventions Readmission Risk Prevention Plan 11/25/2019 11/19/2019  Transportation Screening Complete Complete  PCP or Specialist Appt within 3-5 Days Complete Complete  HRI or Home Care Consult Complete Complete  Social Work Consult for [place] Planning/Counseling Complete Complete  Palliative Care Screening Complete Complete  Medication Review Press photographer) Complete Complete  Some recent data might be hidden

## 2020-01-08 NOTE — Progress Notes (Signed)
PROGRESS NOTE    [name]  EXH:[id number] DOB: 02-Oct-1945 DOA: 12/24/2019 PCP: [name], NP   Brief Narrative:  74 year old with history of CHF EF 25% status post BiV ICD, severe COPD, respiratory failure, permanent A. fib status post AV node ablation, ICH, bioprosthetic MVR, DM2, NSCLC s/p resection HTN admitted for GI bleed to have cecal mass complicated by CHF exacerbation. CTA abd= neg for GI bleed. Family doesn't want EGD or C scope. Currently PRN transfusions and lasix. Palliative team following for now.  Due to failure of much improvement, patient was offered home hospice versus LTAC.  He has decided to go to [facility].  Assessment & Plan:   Principal Problem:   SOB (shortness of breath) Active Problems:   Lung cancer (HCC)   Chronic atrial fibrillation (HCC)   Acute exacerbation of CHF (congestive heart failure) (HCC)   Pressure ulcer   Gastrointestinal hemorrhage   Acute blood loss anemia   Protein-calorie malnutrition, severe   Hemorrhagic shock (HCC)   Hypoxia   Generalized pain   General weakness   Shortness of breath  Palliative care/goals of care - Patient continues to be open to further discussion with palliative care, understands that given prognosis as below without ongoing treatment he will only continue to worsen. -Palliative care consult pending, ultimate disposition at this point appears to be LTAC although certainly would not be unreasonable to entertain hospice house if patient can be made comfortable on minimal to no oxygen  Acute on chronic combined systolic and diastolic CHF with an EF of 25 to 30% status post AICD and pacemaker, CAD, Permanent A fib, WHO group 2 and 3 pulmonary HTN, HLD. He needs to be dependent on heated high flow with sats in the low 80s today but asymptomatic.   Not on anticoagulation due to bleeding risk and history of ICH Continue home diuretics: 80 mg Lasix p.o. daily Deactivated defibrillator function, continue  bi-ventricular pacing. BiPAP as necessary.  Lower GI bleeding with concern for cecal mass noted on CT abd/pelvis 12/06/19. Does not want any endoscopic evaluation at this time.  Palliative care team following.  Suprapubic pain Resolved  Urine cultures positive for Enterococcus faecalis, UTI ruled out Denies any urinary symptoms and thus does not meet criteria for treatment per IDSA guidelines.   We will hold off on antibiotic treatment and clinically monitor  Acute on chronic hypoxic respiratory failure, worsening and on heated high flow nasal cannula secondary to severe COPD/emphysema History of non-small cell lung cancer status post VATS No improvement over the past 48 hours, continues to require high flow oxygen  Continue aggressive scheduled and as needed bronchodilators.  I-S/flutter SpO2: 93 % O2 Flow Rate (L/min): 30 L/min FiO2 (%): 50 %  DM type II uncontrolled with hyperglycemia. Metformin on hold currently on insulin sliding scale and Accu-Chek.  Hx of Depression. Citalopram 10 mg daily  BPH, urine retention. Bethanechol 10 mg po TID, Tamsuolsin 0.4 po Daily  Deconditioning with ongoing ambulatory dysfunction. PT/OT once medically stable  History of alcoholic cirrhosis Supportive care.  Overall appears to be compensated from a hepatic standpoint  Goals of care. Palliative care team following, currently patient is DNR.  Continue management as planned above with as needed BiPAP.  Stage 1 Coccyx Ulcer, pOA -Continue Wound Care  Overall poor prognosis, patient understands this.  He has very poor pulmonary reserve.  After discussing with the patient and wife-decided to go to LTAC.  DVT prophylaxis: Place and maintain sequential compression device  Start: 12/24/19 0843  Code Status: DNR Family Communication: None at bedside  Status is: Inpatient  Remains inpatient appropriate because:Hemodynamically unstable   Dispo: The patient is from: Home               Anticipated d/c is to: LTAC              Anticipated d/c date is: 1-2 days              Patient currently is not medically stable to d/c.  Still remains on heated high flow.  Will need LTAC, pending case review  Subjective: Continues to feel quite poor in general, no specific complaints although does admit to ongoing shortness of breath fatigue and weakness.  Attempting to eat breakfast this morning with some difficulty, denies chest pain, nausea, vomiting, diarrhea, constipation, headache, fevers, chills.  Examination: General: Cachectic elderly frail with bilateral temporal wasting. Appears fatigued and lethargic but A/O x4 Respiratory: Diffuse coarse breath sounds bilaterally without accessory muscle use, on heated high flow Cardiovascular: Irregularly irregular Abdomen: Nontender nondistended good bowel sounds Musculoskeletal: No edema noted Skin: No rashes seen Neurologic: CN 2-12 grossly intact.  And nonfocal Psychiatric: Normal judgment and insight. Normal mood/affect.  Objective: Vitals:   01/08/20 0305 01/08/20 0400 01/08/20 0500 01/08/20 0600  BP: 127/64 (!) 116/92    Pulse: 74 74  76  Resp: 17 18  16   Temp:  97.7 F (36.5 C)    TempSrc:  Oral    SpO2: 97% 95%  92%  Weight:   66.4 kg   Height:        Intake/Output Summary (Last 24 hours) at 01/08/2020 0658 Last data filed at 01/08/2020 0230 Gross per 24 hour  Intake 1560 ml  Output 1875 ml  Net -315 ml   Filed Weights   01/06/20 0500 01/07/20 0356 01/08/20 0500  Weight: 64.5 kg 67 kg 66.4 kg     Data Reviewed:   CBC: Recent Labs  Lab 01/03/20 0217 01/04/20 0259 01/05/20 0347 01/06/20 0335 01/08/20 0135  WBC 10.2 9.8 10.5 10.7* 12.1*  HGB 7.8* 8.1* 8.5* 7.5* 7.7*  HCT 26.1* 26.9* 27.8* 25.5* 25.8*  MCV 85.0 86.5 87.4 85.3 85.4  PLT 248 259 264 233 195   Basic Metabolic Panel: Recent Labs  Lab 01/04/20 0259 01/05/20 0347 01/06/20 0335 01/07/20 0301 01/08/20 0135  NA 131* 132* 133* 130* 133*    K 4.4 4.1 3.6 4.3 4.1  CL 90* 92* 93* 91* 92*  CO2 30 34* 32 29 32  GLUCOSE 180* 179* 166* 172* 161*  BUN 23 25* 20 22 19   CREATININE 0.80 0.82 0.73 0.78 0.68  CALCIUM 7.8* 8.0* 7.7* 7.9* 8.1*  MG 2.1 2.1 2.2 2.0 2.1   GFR: Estimated Creatinine Clearance: 78.4 mL/min (by C-G formula based on SCr of 0.68 mg/dL). Liver Function Tests: No results for input(s): AST, ALT, ALKPHOS, BILITOT, PROT, ALBUMIN in the last 168 hours. No results for input(s): LIPASE, AMYLASE in the last 168 hours. No results for input(s): AMMONIA in the last 168 hours. Coagulation Profile: No results for input(s): INR, PROTIME in the last 168 hours. Cardiac Enzymes: No results for input(s): CKTOTAL, CKMB, CKMBINDEX, TROPONINI in the last 168 hours. BNP (last 3 results) No results for input(s): PROBNP in the last 8760 hours. HbA1C: No results for input(s): HGBA1C in the last 72 hours. CBG: Recent Labs  Lab 01/06/20 2128 01/07/20 0833 01/07/20 1216 01/07/20 1756 01/07/20 2225  GLUCAP 148* 149* 148* 183*  169*   Lipid Profile: No results for input(s): CHOL, HDL, LDLCALC, TRIG, CHOLHDL, LDLDIRECT in the last 72 hours. Thyroid Function Tests: No results for input(s): TSH, T4TOTAL, FREET4, T3FREE, THYROIDAB in the last 72 hours. Anemia Panel: No results for input(s): VITAMINB12, FOLATE, FERRITIN, TIBC, IRON, RETICCTPCT in the last 72 hours. Sepsis Labs: Recent Labs  Lab 01/02/20 0150  PROCALCITON <0.10    Recent Results (from the past 240 hour(s))  Culture, Urine     Status: Abnormal   Collection Time: 01/04/20  2:33 PM   Specimen: Urine, Catheterized  Result Value Ref Range Status   Specimen Description   Final    URINE, CATHETERIZED Performed at [facility] [address] [place], [place] [address]    Special Requests   Final    Normal Performed at [facility], [place] [address], [place], [place] [address]    Culture >=100,000 COLONIES/mL ENTEROCOCCUS  FAECALIS (A)  Final   Report Status 01/06/2020 FINAL  Final   Organism ID, Bacteria ENTEROCOCCUS FAECALIS (A)  Final      Susceptibility   Enterococcus faecalis - MIC*    AMPICILLIN <=2 SENSITIVE Sensitive     NITROFURANTOIN <=16 SENSITIVE Sensitive     VANCOMYCIN 2 SENSITIVE Sensitive     * >=100,000 COLONIES/mL ENTEROCOCCUS FAECALIS    Radiology Studies: No results found.  Scheduled Meds: . bethanechol  10 mg Oral TID  . budesonide (PULMICORT) nebulizer solution  0.5 mg Nebulization BID  . Chlorhexidine Gluconate Cloth  6 each Topical Daily  . citalopram  10 mg Oral Daily  . dextromethorphan-guaiFENesin  1 tablet Oral BID  . digoxin  0.125 mg Oral Daily  . feeding supplement  1 Container Oral BID BM  . furosemide  80 mg Oral Daily  . insulin aspart  0-5 Units Subcutaneous QHS  . insulin aspart  0-9 Units Subcutaneous TID WC  . ipratropium-albuterol  3 mL Nebulization TID  . mouth rinse  15 mL Mouth Rinse BID  . pantoprazole  40 mg Oral Daily  . tamsulosin  0.4 mg Oral QPC supper  . cyanocobalamin  1,000 mcg Oral Daily   Continuous Infusions:   LOS: 15 days   Time spent: 35 mins   Earleen Newport [facility]  If 7PM-7AM, please contact night-coverage  01/08/2020, 6:58 AM

## 2020-01-08 NOTE — Progress Notes (Addendum)
Physical Therapy Treatment Patient Details Name: [name] MRN: [id number] DOB: 11-17-1945 Today's Date: 01/08/2020    History of Present Illness 74 yo male presented from rehab center with abdominal pain and maroon colored stool.  PCCM asked to admit to ICU.  Recent admission at [facility] where pt had CT abd/pelvis 12/06/19 that showed possible cecal mass. Pt DCed to [facility].   He is DNR/DNI.  Opted against any endoscopic procedures. PMH of CHF, COPD on 5L O2 baseline, a fib, lung cancer s/p wedge resection, DM2, HTN, HLD    PT Comments    Patient  On HHFNC, 30 L, 60% FiO2. Patient  SPO2 90%. Patient incontinent of BM. While rolllng  To get washed up. Noted desaturation down to 74% and  sustaining. Added partial NRB to get saturation back to 90 %. Did not sit on bed edge as patient continued to have BM. Will assess PT goals next visit as patient has significantly declined and is very limited in activity.  Follow Up Recommendations  SNF;LTACH     Equipment Recommendations  None recommended by PT    Recommendations for Other Services       Precautions / Restrictions Precautions Precautions: Fall Precaution Comments: HFNC 30L 60%, required addidtional Partial NRB to maintain sats  <>80%, frequent BM'S check before mobilizing    Mobility  Bed Mobility   Bed Mobility: Rolling Rolling: Min assist         General bed mobility comments: Did not get to sit up on bed edge due to BM, then desatting to 74% on HFNCm, added partial NRB to get sats to 90%  Transfers                 General transfer comment: NT  Ambulation/Gait                 Stairs             Wheelchair Mobility    Modified Rankin (Stroke Patients Only)       Balance                                            Cognition   Behavior During Therapy: Flat affect                                   General Comments: answers with few words, minimal  engagement,states " niot really" to getting up.      Exercises      General Comments        Pertinent Vitals/Pain Faces Pain Scale: Hurts even more Pain Location: rectume/buttocks Pain Descriptors / Indicators: Grimacing Pain Intervention(s): Monitored during session;Limited activity within patient's tolerance    Home Living                      Prior Function            PT Goals (current goals can now be found in the care plan section) Progress towards PT goals: Not progressing toward goals - comment;PT to reassess next treatment (patient's respiratory status has progressively worsened. ?will assesss need for further PT in acute.)    Frequency    Min 2X/week      PT Plan Current plan remains appropriate    Co-evaluation  AM-PAC PT "6 Clicks" Mobility   Outcome Measure  Help needed turning from your back to your side while in a flat bed without using bedrails?: A Lot Help needed moving from lying on your back to sitting on the side of a flat bed without using bedrails?: A Lot Help needed moving to and from a bed to a chair (including a wheelchair)?: A Lot Help needed standing up from a chair using your arms (e.g., wheelchair or bedside chair)?: A Lot Help needed to walk in hospital room?: Total Help needed climbing 3-5 steps with a railing? : Total 6 Click Score: 10    End of Session Equipment Utilized During Treatment: Oxygen Activity Tolerance: Treatment limited secondary to medical complications (Comment) Patient left: in bed;with call bell/phone within reach;with nursing/sitter in room Nurse Communication: Mobility status PT Visit Diagnosis: Other abnormalities of gait and mobility (R26.89);Difficulty in walking, not elsewhere classified (R26.2)     Time: 3435-6861 PT Time Calculation (min) (ACUTE ONLY): 15 min  Charges:  $Therapeutic Activity: 8-22 mins                     Tresa Endo PT Acute Rehabilitation  Services Pager [phone] Office [phone]    Claretha Cooper 01/08/2020, 3:19 PM

## 2020-01-08 NOTE — Plan of Care (Signed)
Patient able to wean slightly on heated high flow [place] oxygen. Patient denies pain or feelings of respiratory distress; no active bleeding seen.   Problem: Education: Goal: Knowledge of General Education information will improve Description: Including pain rating scale, medication(s)/side effects and non-pharmacologic comfort measures Outcome: Progressing   Problem: Health Behavior/Discharge Planning: Goal: Ability to manage health-related needs will improve Outcome: Progressing   Problem: Clinical Measurements: Goal: Ability to maintain clinical measurements within normal limits will improve Outcome: Progressing Goal: Will remain free from infection Outcome: Progressing Goal: Diagnostic test results will improve Outcome: Progressing Goal: Respiratory complications will improve Outcome: Progressing Goal: Cardiovascular complication will be avoided Outcome: Progressing   Problem: Activity: Goal: Risk for activity intolerance will decrease Outcome: Progressing   Problem: Nutrition: Goal: Adequate nutrition will be maintained Outcome: Progressing   Problem: Coping: Goal: Level of anxiety will decrease Outcome: Progressing   Problem: Elimination: Goal: Will not experience complications related to bowel motility Outcome: Progressing Goal: Will not experience complications related to urinary retention Outcome: Progressing   Problem: Pain Managment: Goal: General experience of comfort will improve Outcome: Progressing   Problem: Safety: Goal: Ability to remain free from injury will improve Outcome: Progressing   Problem: Skin Integrity: Goal: Risk for impaired skin integrity will decrease Outcome: Progressing

## 2020-01-09 LAB — GLUCOSE, CAPILLARY
Glucose-Capillary: 144 mg/dL — ABNORMAL HIGH (ref 70–99)
Glucose-Capillary: 244 mg/dL — ABNORMAL HIGH (ref 70–99)
Glucose-Capillary: 122 mg/dL — ABNORMAL HIGH (ref 70–99)
Glucose-Capillary: 204 mg/dL — ABNORMAL HIGH (ref 70–99)

## 2020-01-09 NOTE — Progress Notes (Signed)
OT Cancellation Note  Patient Details Name: [name] MRN: [id number] DOB: [birth year]   Cancelled Treatment:    Reason Eval/Treat Not Completed: Patient declined, no reason specified. Patient states "I just don't feel good." Will check back as schedule permits.   Delbert Phenix OT Pager: [place] 01/09/2020, 2:34 PM

## 2020-01-09 NOTE — Progress Notes (Signed)
PROGRESS NOTE    [name]  OEU:[id number] DOB: January 21, 1946 DOA: 12/24/2019 PCP: [name], NP   Brief Narrative:  74 year old with history of CHF EF 25% status post BiV ICD, severe COPD, respiratory failure, permanent A. fib status post AV node ablation, ICH, bioprosthetic MVR, DM2, NSCLC s/p resection HTN admitted for GI bleed to have cecal mass complicated by CHF exacerbation. CTA abd= neg for GI bleed. Family doesn't want EGD or C scope. Currently PRN transfusions and lasix. Palliative team following for now.  Due to failure of much improvement, patient was offered home hospice versus LTAC.  He has decided to go to [facility].  Assessment & Plan:   Principal Problem:   SOB (shortness of breath) Active Problems:   Lung cancer (HCC)   Chronic atrial fibrillation (HCC)   Acute exacerbation of CHF (congestive heart failure) (HCC)   Pressure ulcer   Gastrointestinal hemorrhage   Acute blood loss anemia   Protein-calorie malnutrition, severe   Hemorrhagic shock (HCC)   Hypoxia   Generalized pain   General weakness   Shortness of breath   Acute on chronic hypoxic respiratory failure, worsening and on heated high flow nasal cannula secondary to severe COPD/emphysema History of non-small cell lung cancer status post VATS Respiratory status continues to decline Continue aggressive scheduled and as needed bronchodilators.  I-S/flutter SpO2: 100 % O2 Flow Rate (L/min): 30 L/min (plus 15 lpm NRB) FiO2 (%): 90 %  Acute on chronic combined systolic and diastolic CHF with an EF of 25 to 30% status post AICD and pacemaker, CAD, Permanent A fib, WHO group 2 and 3 pulmonary HTN, HLD. He needs to be dependent on heated high flow with sats in the low 80s today but asymptomatic.   Not on anticoagulation due to bleeding risk and history of ICH Continue home diuretics: 80 mg Lasix p.o. daily Deactivated defibrillator function, continue bi-ventricular pacing. BiPAP as necessary.  Lower GI  bleeding with concern for cecal mass noted on CT abd/pelvis 12/06/19. Does not want any endoscopic evaluation at this time.  Palliative care team following.  Suprapubic pain Resolved  Urine cultures positive for Enterococcus faecalis, UTI ruled out Denies any urinary symptoms and thus does not meet criteria for treatment per IDSA guidelines.   We will hold off on antibiotic treatment and clinically monitor  DM type II uncontrolled with hyperglycemia. Metformin on hold currently on insulin sliding scale and Accu-Chek.  Hx of Depression. Citalopram 10 mg daily  BPH, urine retention. Bethanechol 10 mg po TID, Tamsuolsin 0.4 po Daily  Deconditioning with ongoing ambulatory dysfunction. PT/OT once medically stable  History of alcoholic cirrhosis Supportive care.  Overall appears to be compensated from a hepatic standpoint  Palliative care/goals of care -Patient was reevaluated by palliative care team, patient continues to hold off on advancing towards palliative care or hospice despite his worsening respiratory status.  Patient would like to transfer to [facility] for ongoing treatment and hopeful weaning of oxygen despite our discussion that his oxygen levels have gotten worse over the past 48 hours.  His wife recently had surgery and he is awaiting for her to be cleared to visit for further discussion in person.  Stage 1 Coccyx Ulcer, pOA -Continue Wound Care  Overall poor prognosis, patient understands this.  He has very poor pulmonary reserve.  After discussing with the patient and wife-decided to go to LTAC.  DVT prophylaxis: Place and maintain sequential compression device Start: 12/24/19 0843  Code Status: DNR Family Communication: None  at bedside  Status is: Inpatient  Remains inpatient appropriate because:Hemodynamically unstable and Inpatient level of care appropriate due to severity of illness   Dispo: The patient is from: Home              Anticipated d/c is to:  LTAC              Anticipated d/c date is: 1-2 days              Patient currently is not medically stable to d/c.  Still remains on heated high flow.  Will need LTAC, pending case review  Subjective: No acute issues or events overnight, patient feels moderately improved on higher oxygen levels this morning.  Denies nausea, vomiting, diarrhea, constipation, headache, fevers, chills.  Examination: General: Cachectic elderly frail with bilateral temporal wasting. Appears fatigued and lethargic but A/O x4 Respiratory: Diffuse coarse breath sounds bilaterally without accessory muscle use, on heated high flow Cardiovascular: Regular rate rhythm without murmurs rubs or gallops Abdomen: Nontender nondistended good bowel sounds Musculoskeletal: No edema noted Skin: No rashes seen Neurologic: CN 2-12 grossly intact.  And nonfocal Psychiatric: Normal judgment and insight. Normal mood/affect.  Objective: Vitals:   01/09/20 0500 01/09/20 0600 01/09/20 0800 01/09/20 0829  BP:   (!) 109/45   Pulse: 75 74 77   Resp: (!) 21 17 20    Temp:   98.2 F (36.8 C)   TempSrc:   Axillary   SpO2: (!) 79% 100% 90% 100%  Weight:      Height:        Intake/Output Summary (Last 24 hours) at 01/09/2020 1146 Last data filed at 01/09/2020 0439 Gross per 24 hour  Intake 240 ml  Output 1600 ml  Net -1360 ml   Filed Weights   01/07/20 0356 01/08/20 0500 01/09/20 0438  Weight: 67 kg 66.4 kg 67.5 kg     Data Reviewed:   CBC: Recent Labs  Lab 01/03/20 0217 01/04/20 0259 01/05/20 0347 01/06/20 0335 01/08/20 0135  WBC 10.2 9.8 10.5 10.7* 12.1*  HGB 7.8* 8.1* 8.5* 7.5* 7.7*  HCT 26.1* 26.9* 27.8* 25.5* 25.8*  MCV 85.0 86.5 87.4 85.3 85.4  PLT 248 259 264 233 401   Basic Metabolic Panel: Recent Labs  Lab 01/04/20 0259 01/05/20 0347 01/06/20 0335 01/07/20 0301 01/08/20 0135  NA 131* 132* 133* 130* 133*  K 4.4 4.1 3.6 4.3 4.1  CL 90* 92* 93* 91* 92*  CO2 30 34* 32 29 32  GLUCOSE 180* 179* 166*  172* 161*  BUN 23 25* 20 22 19   CREATININE 0.80 0.82 0.73 0.78 0.68  CALCIUM 7.8* 8.0* 7.7* 7.9* 8.1*  MG 2.1 2.1 2.2 2.0 2.1   GFR: Estimated Creatinine Clearance: 79.7 mL/min (by C-G formula based on SCr of 0.68 mg/dL). Liver Function Tests: No results for input(s): AST, ALT, ALKPHOS, BILITOT, PROT, ALBUMIN in the last 168 hours. No results for input(s): LIPASE, AMYLASE in the last 168 hours. No results for input(s): AMMONIA in the last 168 hours. Coagulation Profile: No results for input(s): INR, PROTIME in the last 168 hours. Cardiac Enzymes: No results for input(s): CKTOTAL, CKMB, CKMBINDEX, TROPONINI in the last 168 hours. BNP (last 3 results) No results for input(s): PROBNP in the last 8760 hours. HbA1C: No results for input(s): HGBA1C in the last 72 hours. CBG: Recent Labs  Lab 01/08/20 1213 01/08/20 1709 01/08/20 2133 01/09/20 0734 01/09/20 1142  GLUCAP 242* 165* 153* 144* 244*   Lipid Profile: No results for input(s):  CHOL, HDL, LDLCALC, TRIG, CHOLHDL, LDLDIRECT in the last 72 hours. Thyroid Function Tests: No results for input(s): TSH, T4TOTAL, FREET4, T3FREE, THYROIDAB in the last 72 hours. Anemia Panel: No results for input(s): VITAMINB12, FOLATE, FERRITIN, TIBC, IRON, RETICCTPCT in the last 72 hours. Sepsis Labs: No results for input(s): PROCALCITON, LATICACIDVEN in the last 168 hours.  Recent Results (from the past 240 hour(s))  Culture, Urine     Status: Abnormal   Collection Time: 01/04/20  2:33 PM   Specimen: Urine, Catheterized  Result Value Ref Range Status   Specimen Description   Final    URINE, CATHETERIZED Performed at [facility] [address], [place], [place] [address]    Special Requests   Final    Normal Performed at [facility], [place] [address] [place], [place] [address]    Culture >=100,000 COLONIES/mL ENTEROCOCCUS FAECALIS (A)  Final   Report Status 01/06/2020 FINAL  Final   Organism ID,  Bacteria ENTEROCOCCUS FAECALIS (A)  Final      Susceptibility   Enterococcus faecalis - MIC*    AMPICILLIN <=2 SENSITIVE Sensitive     NITROFURANTOIN <=16 SENSITIVE Sensitive     VANCOMYCIN 2 SENSITIVE Sensitive     * >=100,000 COLONIES/mL ENTEROCOCCUS FAECALIS    Radiology Studies: No results found.  Scheduled Meds: . bethanechol  10 mg Oral TID  . budesonide (PULMICORT) nebulizer solution  0.5 mg Nebulization BID  . Chlorhexidine Gluconate Cloth  6 each Topical Daily  . citalopram  10 mg Oral Daily  . dextromethorphan-guaiFENesin  1 tablet Oral BID  . digoxin  0.125 mg Oral Daily  . feeding supplement  1 Container Oral BID BM  . furosemide  80 mg Oral Daily  . insulin aspart  0-5 Units Subcutaneous QHS  . insulin aspart  0-9 Units Subcutaneous TID WC  . ipratropium-albuterol  3 mL Nebulization TID  . mouth rinse  15 mL Mouth Rinse BID  . pantoprazole  40 mg Oral Daily  . tamsulosin  0.4 mg Oral QPC supper  . cyanocobalamin  1,000 mcg Oral Daily   Continuous Infusions:   LOS: 16 days   Time spent: 35 mins   Earleen Newport [facility]  If 7PM-7AM, please contact night-coverage  01/09/2020, 11:46 AM

## 2020-01-10 LAB — GLUCOSE, CAPILLARY
Glucose-Capillary: 153 mg/dL — ABNORMAL HIGH (ref 70–99)
Glucose-Capillary: 165 mg/dL — ABNORMAL HIGH (ref 70–99)
Glucose-Capillary: 197 mg/dL — ABNORMAL HIGH (ref 70–99)
Glucose-Capillary: 122 mg/dL — ABNORMAL HIGH (ref 70–99)

## 2020-01-10 NOTE — TOC Progression Note (Addendum)
Transition of Care [facility]) - Progression Note    Patient Details  Name: [name] MRN: [id number] Date of Birth: 1946-02-09  Transition [facility] CM/SW Contact  Leeroy Cha, RN Phone Number: 01/10/2020, 8:07 AM  Clinical Narrative:    Tyson Dense and awaiting ltach bed at [facility] tcf-Eric [facility] with [facility].  No beds available today will keep checking over the weekend.  Patient made aware of this.  Expected Discharge Plan: Long Term Acute Care (LTAC) Barriers to Discharge: Continued Medical Work up  Expected Discharge Plan and Services Expected Discharge Plan: [place] (LTAC)   Discharge Planning Services: CM Consult Post Acute Care Choice: [place] Living arrangements for the past 2 months: [place]                                       Social Determinants of Health (SDOH) Interventions    Readmission Risk Interventions Readmission Risk Prevention Plan 11/25/2019 11/19/2019  Transportation Screening Complete Complete  PCP or Specialist Appt within 3-5 Days Complete Complete  HRI or Home Care Consult Complete Complete  Social Work Consult for [place] Planning/Counseling Complete Complete  Palliative Care Screening Complete Complete  Medication Review Press photographer) Complete Complete  Some recent data might be hidden

## 2020-01-10 NOTE — Progress Notes (Signed)
PROGRESS NOTE    [name]  ZOX:[id number] DOB: Jul 15, 1945 DOA: 12/24/2019 PCP: [name], NP   Brief Narrative:  74 year old with history of CHF EF 25% status post BiV ICD, severe COPD, respiratory failure, permanent A. fib status post AV node ablation, ICH, bioprosthetic MVR, DM2, NSCLC s/p resection HTN admitted for GI bleed to have cecal mass complicated by CHF exacerbation. CTA abd= neg for GI bleed. Family doesn't want EGD or C scope. Currently PRN transfusions and lasix. Palliative team following for now.  Due to failure of much improvement, patient was offered home hospice versus LTAC.  He has decided to go to [facility] currently no beds available.  Assessment & Plan:   Principal Problem:   SOB (shortness of breath) Active Problems:   Lung cancer (HCC)   Chronic atrial fibrillation (HCC)   Acute exacerbation of CHF (congestive heart failure) (HCC)   Pressure ulcer   Gastrointestinal hemorrhage   Acute blood loss anemia   Protein-calorie malnutrition, severe   Hemorrhagic shock (HCC)   Hypoxia   Generalized pain   General weakness   Shortness of breath   Acute on chronic hypoxic respiratory failure, worsening and on heated high flow nasal cannula secondary to severe COPD/emphysema History of non-small cell lung cancer status post VATS Respiratory status continues to decline Continue aggressive scheduled and as needed bronchodilators.  I-S/flutter SpO2: 94 % O2 Flow Rate (L/min): 25 L/min FiO2 (%): 60 %  Acute on chronic combined systolic and diastolic CHF with an EF of 25 to 30% status post AICD and pacemaker, CAD, Permanent A fib, WHO group 2 and 3 pulmonary HTN, HLD. He continues to be dependent on heated high flow with sats in the low 90s today Not on anticoagulation due to bleeding risk and history of ICH Continue home diuretics: 80 mg Lasix p.o. daily Deactivated defibrillator function, continue bi-ventricular pacing. BiPAP as necessary.  Lower GI bleeding  with concern for cecal mass noted on CT abd/pelvis 12/06/19. Does not want any endoscopic evaluation at this time.  Palliative care team following.  Suprapubic pain Resolved  Urine cultures positive for Enterococcus faecalis, UTI ruled out Denies any urinary symptoms and thus does not meet criteria for treatment per IDSA guidelines.   We will hold off on antibiotic treatment and clinically monitor  DM type II uncontrolled with hyperglycemia. Metformin on hold currently on insulin sliding scale and Accu-Chek.  Hx of Depression. Citalopram 10 mg daily  BPH, urine retention. Bethanechol 10 mg po TID, Tamsuolsin 0.4 po Daily  Deconditioning with ongoing ambulatory dysfunction. PT/OT once medically stable  History of alcoholic cirrhosis Supportive care.  Overall appears to be compensated from a hepatic standpoint  Palliative care/goals of care -Patient was reevaluated by palliative care team, patient continues to hold off on advancing towards palliative care or hospice despite his worsening respiratory status.  Patient would like to transfer to [facility] for ongoing treatment and hopeful weaning of oxygen despite our discussion that his oxygen levels have gotten worse over the past 48 hours.  His wife recently had surgery and he is awaiting for her to be cleared to visit for further discussion in person.  Stage 1 Coccyx Ulcer, pOA -Continue Wound Care  Overall poor prognosis, patient understands this.  He has very poor pulmonary reserve.  After discussing with the patient and wife-decided to go to LTAC.  DVT prophylaxis: Place and maintain sequential compression device Start: 12/24/19 0843  Code Status: DNR Family Communication: None at bedside  Status  is: Inpatient  Remains inpatient appropriate because:Hemodynamically unstable and Inpatient level of care appropriate due to severity of illness   Dispo: The patient is from: Home              Anticipated d/c is to: LTAC               Anticipated d/c date is: imminent pending LTAC bed availability              Patient currently is not medically stable to d/c.  Still remains on heated high flow.  Will need LTAC, pending case review and bed availability  Subjective: No acute issues or events overnight, patient feels moderately improved on higher oxygen levels this morning.  Denies nausea, vomiting, diarrhea, constipation, headache, fevers, chills.  Examination: General: Cachectic elderly frail with bilateral temporal wasting. Appears fatigued and lethargic but A/O x4 Respiratory: Diffuse coarse breath sounds bilaterally without accessory muscle use, on heated high flow Cardiovascular: Regular rate rhythm without murmurs rubs or gallops Abdomen: Nontender nondistended good bowel sounds Musculoskeletal: No edema noted Skin: No rashes seen Neurologic: CN 2-12 grossly intact.  And nonfocal Psychiatric: Normal judgment and insight. Normal mood/affect.  Objective: Vitals:   01/10/20 0946 01/10/20 1000 01/10/20 1100 01/10/20 1123  BP:    111/63  Pulse: 72 74 76 74  Resp:  18 17 18   Temp:      TempSrc:      SpO2:  90% 91% 94%  Weight:      Height:        Intake/Output Summary (Last 24 hours) at 01/10/2020 1204 Last data filed at 01/10/2020 0500 Gross per 24 hour  Intake --  Output 1700 ml  Net -1700 ml   Filed Weights   01/08/20 0500 01/09/20 0438 01/10/20 0500  Weight: 66.4 kg 67.5 kg 67.1 kg     Data Reviewed:   CBC: Recent Labs  Lab 01/04/20 0259 01/05/20 0347 01/06/20 0335 01/08/20 0135  WBC 9.8 10.5 10.7* 12.1*  HGB 8.1* 8.5* 7.5* 7.7*  HCT 26.9* 27.8* 25.5* 25.8*  MCV 86.5 87.4 85.3 85.4  PLT 259 264 233 195   Basic Metabolic Panel: Recent Labs  Lab 01/04/20 0259 01/05/20 0347 01/06/20 0335 01/07/20 0301 01/08/20 0135  NA 131* 132* 133* 130* 133*  K 4.4 4.1 3.6 4.3 4.1  CL 90* 92* 93* 91* 92*  CO2 30 34* 32 29 32  GLUCOSE 180* 179* 166* 172* 161*  BUN 23 25* 20 22 19   CREATININE  0.80 0.82 0.73 0.78 0.68  CALCIUM 7.8* 8.0* 7.7* 7.9* 8.1*  MG 2.1 2.1 2.2 2.0 2.1   GFR: Estimated Creatinine Clearance: 79.2 mL/min (by C-G formula based on SCr of 0.68 mg/dL). Liver Function Tests: No results for input(s): AST, ALT, ALKPHOS, BILITOT, PROT, ALBUMIN in the last 168 hours. No results for input(s): LIPASE, AMYLASE in the last 168 hours. No results for input(s): AMMONIA in the last 168 hours. Coagulation Profile: No results for input(s): INR, PROTIME in the last 168 hours. Cardiac Enzymes: No results for input(s): CKTOTAL, CKMB, CKMBINDEX, TROPONINI in the last 168 hours. BNP (last 3 results) No results for input(s): PROBNP in the last 8760 hours. HbA1C: No results for input(s): HGBA1C in the last 72 hours. CBG: Recent Labs  Lab 01/09/20 0734 01/09/20 1142 01/09/20 1641 01/09/20 2134 01/10/20 0808  GLUCAP 144* 244* 122* 204* 153*   Lipid Profile: No results for input(s): CHOL, HDL, LDLCALC, TRIG, CHOLHDL, LDLDIRECT in the last 72 hours. Thyroid  Function Tests: No results for input(s): TSH, T4TOTAL, FREET4, T3FREE, THYROIDAB in the last 72 hours. Anemia Panel: No results for input(s): VITAMINB12, FOLATE, FERRITIN, TIBC, IRON, RETICCTPCT in the last 72 hours. Sepsis Labs: No results for input(s): PROCALCITON, LATICACIDVEN in the last 168 hours.  Recent Results (from the past 240 hour(s))  Culture, Urine     Status: Abnormal   Collection Time: 01/04/20  2:33 PM   Specimen: Urine, Catheterized  Result Value Ref Range Status   Specimen Description   Final    URINE, CATHETERIZED Performed at [facility] [address] [place], [place] [address]    Special Requests   Final    Normal Performed at [facility], [place] [address] [place], [place] [address]    Culture >=100,000 COLONIES/mL ENTEROCOCCUS FAECALIS (A)  Final   Report Status 01/06/2020 FINAL  Final   Organism ID, Bacteria ENTEROCOCCUS FAECALIS (A)  Final       Susceptibility   Enterococcus faecalis - MIC*    AMPICILLIN <=2 SENSITIVE Sensitive     NITROFURANTOIN <=16 SENSITIVE Sensitive     VANCOMYCIN 2 SENSITIVE Sensitive     * >=100,000 COLONIES/mL ENTEROCOCCUS FAECALIS    Radiology Studies: No results found.  Scheduled Meds: . bethanechol  10 mg Oral TID  . budesonide (PULMICORT) nebulizer solution  0.5 mg Nebulization BID  . Chlorhexidine Gluconate Cloth  6 each Topical Daily  . citalopram  10 mg Oral Daily  . dextromethorphan-guaiFENesin  1 tablet Oral BID  . digoxin  0.125 mg Oral Daily  . feeding supplement  1 Container Oral BID BM  . furosemide  80 mg Oral Daily  . insulin aspart  0-5 Units Subcutaneous QHS  . insulin aspart  0-9 Units Subcutaneous TID WC  . ipratropium-albuterol  3 mL Nebulization TID  . mouth rinse  15 mL Mouth Rinse BID  . pantoprazole  40 mg Oral Daily  . tamsulosin  0.4 mg Oral QPC supper  . cyanocobalamin  1,000 mcg Oral Daily   Continuous Infusions:   LOS: 17 days   Time spent: 35 mins   Earleen Newport [facility]  If 7PM-7AM, please contact night-coverage  01/10/2020, 12:04 PM

## 2020-01-11 LAB — CBC
HCT: 27.3 % — ABNORMAL LOW (ref 39.0–52.0)
Hemoglobin: 8.1 g/dL — ABNORMAL LOW (ref 13.0–17.0)
MCH: 25.2 pg — ABNORMAL LOW (ref 26.0–34.0)
MCHC: 29.7 g/dL — ABNORMAL LOW (ref 30.0–36.0)
MCV: 84.8 fL (ref 80.0–100.0)
Platelets: 303 10*3/uL (ref 150–400)
RBC: 3.22 MIL/uL — ABNORMAL LOW (ref 4.22–5.81)
RDW: 19.1 % — ABNORMAL HIGH (ref 11.5–15.5)
WBC: 10.8 10*3/uL — ABNORMAL HIGH (ref 4.0–10.5)
nRBC: 0 % (ref 0.0–0.2)

## 2020-01-11 LAB — BASIC METABOLIC PANEL
Anion gap: 9 (ref 5–15)
BUN: 23 mg/dL (ref 8–23)
CO2: 31 mmol/L (ref 22–32)
Calcium: 7.8 mg/dL — ABNORMAL LOW (ref 8.9–10.3)
Chloride: 90 mmol/L — ABNORMAL LOW (ref 98–111)
Creatinine, Ser: 0.69 mg/dL (ref 0.61–1.24)
GFR calc Af Amer: >60 mL/min (ref >60)
GFR calc non Af Amer: >60 mL/min (ref >60)
Glucose, Bld: 169 mg/dL — ABNORMAL HIGH (ref 70–99)
Potassium: 3.8 mmol/L (ref 3.5–5.1)
Sodium: 130 mmol/L — ABNORMAL LOW (ref 135–145)

## 2020-01-11 LAB — GLUCOSE, CAPILLARY
Glucose-Capillary: 153 mg/dL — ABNORMAL HIGH (ref 70–99)
Glucose-Capillary: 137 mg/dL — ABNORMAL HIGH (ref 70–99)
Glucose-Capillary: 214 mg/dL — ABNORMAL HIGH (ref 70–99)
Glucose-Capillary: 128 mg/dL — ABNORMAL HIGH (ref 70–99)

## 2020-01-11 NOTE — Progress Notes (Signed)
PROGRESS NOTE    [name]  WVP:[id number] DOB: 01-18-1946 DOA: 12/24/2019 PCP: [name], NP   Brief Narrative:  74 year old with history of CHF EF 25% status post BiV ICD, severe COPD, respiratory failure, permanent A. fib status post AV node ablation, ICH, bioprosthetic MVR, DM2, NSCLC s/p resection HTN admitted for GI bleed to have cecal mass complicated by CHF exacerbation. CTA abd= neg for GI bleed. Family doesn't want EGD or C scope. Currently PRN transfusions and lasix. Palliative team following for now.  Due to failure of much improvement, patient was offered home hospice versus LTAC.  He has decided to go to [facility] currently no beds available.  Assessment & Plan:   Principal Problem:   SOB (shortness of breath) Active Problems:   Lung cancer (HCC)   Chronic atrial fibrillation (HCC)   Acute exacerbation of CHF (congestive heart failure) (HCC)   Pressure ulcer   Gastrointestinal hemorrhage   Acute blood loss anemia   Protein-calorie malnutrition, severe   Hemorrhagic shock (HCC)   Hypoxia   Generalized pain   General weakness   Shortness of breath   Acute on chronic hypoxic respiratory failure, worsening and on heated high flow nasal cannula secondary to severe COPD/emphysema History of non-small cell lung cancer status post VATS Respiratory status stable over the past 48h, tends to require increasing oxygen overnight but able to wean subsequently the next morning. Allow for permissive hypoxia to 84% given his baseline lung function Continue aggressive scheduled and as needed bronchodilators.  I-S/flutter SpO2: 100 % O2 Flow Rate (L/min): 30 L/min FiO2 (%): (S) 80 % (weaned to 75%)  Acute on chronic combined systolic and diastolic CHF with an EF of 25 to 30% status post AICD and pacemaker, CAD, Permanent A fib, WHO group 2 and 3 pulmonary HTN, HLD. He continues to be dependent on heated high flow with sats in the mid 90s today Not on anticoagulation due to  bleeding risk and history of ICH Continue home diuretics: 80 mg Lasix p.o. daily Deactivated defibrillator function, continue bi-ventricular pacing. BiPAP as necessary.  Lower GI bleeding with concern for cecal mass noted on CT abd/pelvis 12/06/19. Does not want any endoscopic evaluation at this time.  Palliative care team following.  Suprapubic pain Resolved  Urine cultures positive for Enterococcus faecalis, UTI ruled out Denies any urinary symptoms and thus does not meet criteria for treatment per IDSA guidelines.   We will hold off on antibiotic treatment and clinically monitor  DM type II uncontrolled with hyperglycemia. Metformin on hold currently on insulin sliding scale and Accu-Chek.  Hx of Depression. Citalopram 10 mg daily  BPH, urine retention. Bethanechol 10 mg po TID, Tamsuolsin 0.4 po Daily  Deconditioning with ongoing ambulatory dysfunction. PT/OT once medically stable  History of alcoholic cirrhosis Supportive care.  Overall appears to be compensated from a hepatic standpoint  Palliative care/goals of care -Patient was reevaluated by palliative care team, patient continues to hold off on advancing towards palliative care or hospice despite his worsening respiratory status.  Patient would like to transfer to [facility] for ongoing treatment and hopeful weaning of oxygen despite our discussion that his oxygen levels have gotten worse over the past 48 hours.  His wife recently had surgery and he is awaiting for her to be cleared to visit for further discussion in person.  Stage 1 Coccyx Ulcer, pOA -Continue Wound Care  Overall poor prognosis, patient understands this.  He has very poor pulmonary reserve.  After discussing with  the patient and wife-decided to go to LTAC.  DVT prophylaxis: Place and maintain sequential compression device Start: 12/24/19 0843  Code Status: DNR Family Communication: None at bedside  Status is: Inpatient  Remains inpatient  appropriate because:Hemodynamically unstable and Inpatient level of care appropriate due to severity of illness   Dispo: The patient is from: Home              Anticipated d/c is to: LTAC              Anticipated d/c date is: imminent pending LTAC bed availability              Patient currently is medically stable to d/c.  Awaiting LTAC bed availability  Subjective: No acute issues or events overnight, patient feels moderately improved on higher oxygen levels this morning.  Denies nausea, vomiting, diarrhea, constipation, headache, fevers, chills.  Examination: General: Cachectic elderly frail with bilateral temporal wasting. Appears fatigued and lethargic but A/O x4 Respiratory: Diffuse coarse breath sounds bilaterally without accessory muscle use, on heated high flow Cardiovascular: Regular rate rhythm without murmurs rubs or gallops Abdomen: Nontender nondistended good bowel sounds Musculoskeletal: No edema noted Skin: No rashes seen Neurologic: CN 2-12 grossly intact.  And nonfocal Psychiatric: Normal judgment and insight. Normal mood/affect.  Objective: Vitals:   01/11/20 0727 01/11/20 0800 01/11/20 0900 01/11/20 1116  BP:  (!) 123/55    Pulse:  74    Resp:  18    Temp:   (!) 97.5 F (36.4 C)   TempSrc:   Oral   SpO2: 95% 100%  100%  Weight:      Height:        Intake/Output Summary (Last 24 hours) at 01/11/2020 1157 Last data filed at 01/11/2020 0900 Gross per 24 hour  Intake 720 ml  Output 1300 ml  Net -580 ml   Filed Weights   01/09/20 0438 01/10/20 0500 01/11/20 0525  Weight: 67.5 kg 67.1 kg 68.3 kg   Data Reviewed:   CBC: Recent Labs  Lab 01/05/20 0347 01/06/20 0335 01/08/20 0135 01/11/20 0521  WBC 10.5 10.7* 12.1* 10.8*  HGB 8.5* 7.5* 7.7* 8.1*  HCT 27.8* 25.5* 25.8* 27.3*  MCV 87.4 85.3 85.4 84.8  PLT 264 233 258 308   Basic Metabolic Panel: Recent Labs  Lab 01/05/20 0347 01/06/20 0335 01/07/20 0301 01/08/20 0135 01/11/20 0521  NA 132* 133*  130* 133* 130*  K 4.1 3.6 4.3 4.1 3.8  CL 92* 93* 91* 92* 90*  CO2 34* 32 29 32 31  GLUCOSE 179* 166* 172* 161* 169*  BUN 25* 20 22 19 23   CREATININE 0.82 0.73 0.78 0.68 0.69  CALCIUM 8.0* 7.7* 7.9* 8.1* 7.8*  MG 2.1 2.2 2.0 2.1  --    GFR: Estimated Creatinine Clearance: 80.6 mL/min (by C-G formula based on SCr of 0.69 mg/dL). Liver Function Tests: No results for input(s): AST, ALT, ALKPHOS, BILITOT, PROT, ALBUMIN in the last 168 hours. No results for input(s): LIPASE, AMYLASE in the last 168 hours. No results for input(s): AMMONIA in the last 168 hours. Coagulation Profile: No results for input(s): INR, PROTIME in the last 168 hours. Cardiac Enzymes: No results for input(s): CKTOTAL, CKMB, CKMBINDEX, TROPONINI in the last 168 hours. BNP (last 3 results) No results for input(s): PROBNP in the last 8760 hours. HbA1C: No results for input(s): HGBA1C in the last 72 hours. CBG: Recent Labs  Lab 01/10/20 0808 01/10/20 1229 01/10/20 1703 01/10/20 2135 01/11/20 0825  GLUCAP 153*  165* 197* 122* 153*   Lipid Profile: No results for input(s): CHOL, HDL, LDLCALC, TRIG, CHOLHDL, LDLDIRECT in the last 72 hours. Thyroid Function Tests: No results for input(s): TSH, T4TOTAL, FREET4, T3FREE, THYROIDAB in the last 72 hours. Anemia Panel: No results for input(s): VITAMINB12, FOLATE, FERRITIN, TIBC, IRON, RETICCTPCT in the last 72 hours. Sepsis Labs: No results for input(s): PROCALCITON, LATICACIDVEN in the last 168 hours.  Recent Results (from the past 240 hour(s))  Culture, Urine     Status: Abnormal   Collection Time: 01/04/20  2:33 PM   Specimen: Urine, Catheterized  Result Value Ref Range Status   Specimen Description   Final    URINE, CATHETERIZED Performed at [facility] [address], [place], [place] [address]    Special Requests   Final    Normal Performed at [facility], [place] [address], [place], [place] [address]    Culture  >=100,000 COLONIES/mL ENTEROCOCCUS FAECALIS (A)  Final   Report Status 01/06/2020 FINAL  Final   Organism ID, Bacteria ENTEROCOCCUS FAECALIS (A)  Final      Susceptibility   Enterococcus faecalis - MIC*    AMPICILLIN <=2 SENSITIVE Sensitive     NITROFURANTOIN <=16 SENSITIVE Sensitive     VANCOMYCIN 2 SENSITIVE Sensitive     * >=100,000 COLONIES/mL ENTEROCOCCUS FAECALIS    Radiology Studies: No results found.  Scheduled Meds: . bethanechol  10 mg Oral TID  . budesonide (PULMICORT) nebulizer solution  0.5 mg Nebulization BID  . Chlorhexidine Gluconate Cloth  6 each Topical Daily  . citalopram  10 mg Oral Daily  . dextromethorphan-guaiFENesin  1 tablet Oral BID  . digoxin  0.125 mg Oral Daily  . feeding supplement  1 Container Oral BID BM  . furosemide  80 mg Oral Daily  . insulin aspart  0-5 Units Subcutaneous QHS  . insulin aspart  0-9 Units Subcutaneous TID WC  . ipratropium-albuterol  3 mL Nebulization TID  . mouth rinse  15 mL Mouth Rinse BID  . pantoprazole  40 mg Oral Daily  . tamsulosin  0.4 mg Oral QPC supper  . cyanocobalamin  1,000 mcg Oral Daily   Continuous Infusions:   LOS: 18 days   Time spent: 35 mins  Earleen Newport [facility]  If 7PM-7AM, please contact night-coverage  01/11/2020, 11:57 AM

## 2020-01-11 NOTE — Progress Notes (Signed)
PT Cancellation Note  Patient Details Name: [name] MRN: [id number] DOB: [birth year]   Cancelled Treatment:    Reason Eval/Treat Not Completed: Medical issues which prohibited therapy. Spoke with RN who recommended we hold PT today. Will check back early next week to see if pt is still appropriate for PT services. Thanks.    ,

## 2020-01-12 LAB — GLUCOSE, CAPILLARY
Glucose-Capillary: 156 mg/dL — ABNORMAL HIGH (ref 70–99)
Glucose-Capillary: 129 mg/dL — ABNORMAL HIGH (ref 70–99)
Glucose-Capillary: 177 mg/dL — ABNORMAL HIGH (ref 70–99)

## 2020-01-12 NOTE — Progress Notes (Signed)
OT Cancellation Note  Patient Details Name: [name] MRN: [id number] DOB: Mar 20, 1946   Cancelled Treatment:    Reason Eval/Treat Not Completed: Patient declined, no reason specified. Patient declined therapy today despite encouragement to participate and education in regards to therapeutic process.   Camie Hauss L Dory Demont 01/12/2020, 3:58 PM

## 2020-01-12 NOTE — Progress Notes (Addendum)
PROGRESS NOTE    [name]  XTG:[id number] DOB: [birth year] DOA: 12/24/2019 PCP: [name], NP   Brief Narrative:  74 year old with history of CHF EF 25% status post BiV ICD, severe COPD, respiratory failure, permanent A. fib status post AV node ablation, ICH, bioprosthetic MVR, DM2, NSCLC s/p resection HTN admitted for GI bleed to have cecal mass complicated by CHF exacerbation. CTA abd= neg for GI bleed. Family doesn't want EGD or C scope. Currently PRN transfusions and lasix. Palliative team following for now.  Due to failure of much improvement, patient was offered home hospice versus LTAC.  He has decided to go to [facility] currently no beds available.  Assessment & Plan:   Principal Problem:   SOB (shortness of breath) Active Problems:   Lung cancer (HCC)   Chronic atrial fibrillation (HCC)   Acute exacerbation of CHF (congestive heart failure) (HCC)   Pressure ulcer   Gastrointestinal hemorrhage   Acute blood loss anemia   Protein-calorie malnutrition, severe   Hemorrhagic shock (HCC)   Hypoxia   Generalized pain   General weakness   Shortness of breath   Acute on chronic hypoxic respiratory failure, worsening and on heated high flow nasal cannula secondary to severe COPD/emphysema History of non-small cell lung cancer status post VATS Respiratory status minimally improving per pt over past 48h Allow for permissive hypoxia to 84% given his baseline lung function Continue aggressive scheduled and as needed bronchodilators.  I-S/flutter as tolerated SpO2: 90 % O2 Flow Rate (L/min): 25 L/min FiO2 (%): 60 %  Acute on chronic combined systolic and diastolic CHF with an EF of 25 to 30% status post AICD and pacemaker, CAD, Permanent A fib, WHO group 2 and 3 pulmonary HTN, HLD. He continues to be dependent on heated high flow with sats in the mid 90s today Not on anticoagulation due to bleeding risk and history of ICH Continue home diuretics: 80 mg Lasix p.o.  daily Deactivated defibrillator function, continue bi-ventricular pacing. BiPAP as necessary.  Lower GI bleeding with concern for cecal mass noted on CT abd/pelvis 12/06/19. Does not want any endoscopic evaluation at this time.  Palliative care team following.  Suprapubic pain Resolved  Urine cultures positive for Enterococcus faecalis, UTI ruled out Denies any urinary symptoms and thus does not meet criteria for treatment per IDSA guidelines.   We will hold off on antibiotic treatment and clinically monitor  DM type II uncontrolled with hyperglycemia. Metformin on hold currently on insulin sliding scale and Accu-Chek.  Hx of Depression. Citalopram 10 mg daily  BPH, urine retention. Bethanechol 10 mg po TID, Tamsuolsin 0.4 po Daily  Deconditioning with ongoing ambulatory dysfunction. PT/OT once medically stable  History of alcoholic cirrhosis Supportive care.  Overall appears to be compensated from a hepatic standpoint  Palliative care/goals of care -Patient was reevaluated by palliative care team, patient continues to hold off on advancing towards palliative care or hospice despite his worsening respiratory status.  Patient would like to transfer to [facility] for ongoing treatment and hopeful weaning of oxygen despite our discussion that his oxygen levels have gotten worse over the past 48 hours.  His wife recently had surgery and he is awaiting for her to be cleared to visit for further discussion in person.  Stage 1 Coccyx Ulcer, pOA -Continue Wound Care  Overall poor prognosis, patient understands this.  He has very poor pulmonary reserve.  After discussing with the patient and wife-decided to go to LTAC.  DVT prophylaxis: Place and maintain  sequential compression device Start: 12/24/19 0843  Code Status: DNR Family Communication: None at bedside  Status is: Inpatient  Remains inpatient appropriate because:Hemodynamically unstable and Inpatient level of care  appropriate due to severity of illness  Dispo: The patient is from: Home              Anticipated d/c is to: LTAC              Anticipated d/c date is: imminent pending LTAC bed availability              Patient currently is medically stable to d/c.  Awaiting LTAC bed availability.  Subjective: No acute issues or events overnight, patient feels moderately improved even with permissive hypoxia.  Denies nausea, vomiting, diarrhea, constipation, headache, fevers, chills.  Examination: General: Cachectic elderly frail with bilateral temporal wasting. Appears fatigued and lethargic but A/O x4 Respiratory: Diffuse coarse breath sounds bilaterally without accessory muscle use, on heated high flow Cardiovascular: Regular rate rhythm without murmurs rubs or gallops Abdomen: Nontender nondistended good bowel sounds Musculoskeletal: No edema noted Skin: No rashes seen Neurologic: CN 2-12 grossly intact.  And nonfocal Psychiatric: Normal judgment and insight. Normal mood/affect.  Objective: Vitals:   01/12/20 0400 01/12/20 0438 01/12/20 0449 01/12/20 0800  BP: (!) 110/48 (!) 110/48    Pulse: 74 73    Resp: 19 (!) 21    Temp: 97.8 F (36.6 C)   98.3 F (36.8 C)  TempSrc: Oral   Oral  SpO2: 90% 90%    Weight:   68.3 kg   Height:        Intake/Output Summary (Last 24 hours) at 01/12/2020 1115 Last data filed at 01/12/2020 0700 Gross per 24 hour  Intake 360 ml  Output 1475 ml  Net -1115 ml   Filed Weights   01/10/20 0500 01/11/20 0525 01/12/20 0449  Weight: 67.1 kg 68.3 kg 68.3 kg   Data Reviewed:   CBC: Recent Labs  Lab 01/06/20 0335 01/08/20 0135 01/11/20 0521  WBC 10.7* 12.1* 10.8*  HGB 7.5* 7.7* 8.1*  HCT 25.5* 25.8* 27.3*  MCV 85.3 85.4 84.8  PLT 233 258 735   Basic Metabolic Panel: Recent Labs  Lab 01/06/20 0335 01/07/20 0301 01/08/20 0135 01/11/20 0521  NA 133* 130* 133* 130*  K 3.6 4.3 4.1 3.8  CL 93* 91* 92* 90*  CO2 32 29 32 31  GLUCOSE 166* 172* 161* 169*   BUN 20 22 19 23   CREATININE 0.73 0.78 0.68 0.69  CALCIUM 7.7* 7.9* 8.1* 7.8*  MG 2.2 2.0 2.1  --    GFR: Estimated Creatinine Clearance: 80.6 mL/min (by C-G formula based on SCr of 0.69 mg/dL). Liver Function Tests: No results for input(s): AST, ALT, ALKPHOS, BILITOT, PROT, ALBUMIN in the last 168 hours. No results for input(s): LIPASE, AMYLASE in the last 168 hours. No results for input(s): AMMONIA in the last 168 hours. Coagulation Profile: No results for input(s): INR, PROTIME in the last 168 hours. Cardiac Enzymes: No results for input(s): CKTOTAL, CKMB, CKMBINDEX, TROPONINI in the last 168 hours. BNP (last 3 results) No results for input(s): PROBNP in the last 8760 hours. HbA1C: No results for input(s): HGBA1C in the last 72 hours. CBG: Recent Labs  Lab 01/11/20 0825 01/11/20 1227 01/11/20 1626 01/11/20 2155 01/12/20 0756  GLUCAP 153* 137* 214* 128* 156*   Lipid Profile: No results for input(s): CHOL, HDL, LDLCALC, TRIG, CHOLHDL, LDLDIRECT in the last 72 hours. Thyroid Function Tests: No results for input(s):  TSH, T4TOTAL, FREET4, T3FREE, THYROIDAB in the last 72 hours. Anemia Panel: No results for input(s): VITAMINB12, FOLATE, FERRITIN, TIBC, IRON, RETICCTPCT in the last 72 hours. Sepsis Labs: No results for input(s): PROCALCITON, LATICACIDVEN in the last 168 hours.  Recent Results (from the past 240 hour(s))  Culture, Urine     Status: Abnormal   Collection Time: 01/04/20  2:33 PM   Specimen: Urine, Catheterized  Result Value Ref Range Status   Specimen Description   Final    URINE, CATHETERIZED Performed at [facility] [address], [place], [place] [address]    Special Requests   Final    Normal Performed at [facility], [place] [address] [place], [place] [address]    Culture >=100,000 COLONIES/mL ENTEROCOCCUS FAECALIS (A)  Final   Report Status 01/06/2020 FINAL  Final   Organism ID, Bacteria ENTEROCOCCUS  FAECALIS (A)  Final      Susceptibility   Enterococcus faecalis - MIC*    AMPICILLIN <=2 SENSITIVE Sensitive     NITROFURANTOIN <=16 SENSITIVE Sensitive     VANCOMYCIN 2 SENSITIVE Sensitive     * >=100,000 COLONIES/mL ENTEROCOCCUS FAECALIS    Radiology Studies: No results found.  Scheduled Meds: . bethanechol  10 mg Oral TID  . budesonide (PULMICORT) nebulizer solution  0.5 mg Nebulization BID  . Chlorhexidine Gluconate Cloth  6 each Topical Daily  . citalopram  10 mg Oral Daily  . dextromethorphan-guaiFENesin  1 tablet Oral BID  . digoxin  0.125 mg Oral Daily  . feeding supplement  1 Container Oral BID BM  . furosemide  80 mg Oral Daily  . insulin aspart  0-5 Units Subcutaneous QHS  . insulin aspart  0-9 Units Subcutaneous TID WC  . ipratropium-albuterol  3 mL Nebulization TID  . mouth rinse  15 mL Mouth Rinse BID  . pantoprazole  40 mg Oral Daily  . tamsulosin  0.4 mg Oral QPC supper  . cyanocobalamin  1,000 mcg Oral Daily   Continuous Infusions:   LOS: 19 days   Time spent: 35 mins  Earleen Newport [facility]  If 7PM-7AM, please contact night-coverage  01/12/2020, 11:15 AM

## 2020-01-13 LAB — GLUCOSE, CAPILLARY
Glucose-Capillary: 148 mg/dL — ABNORMAL HIGH (ref 70–99)
Glucose-Capillary: 184 mg/dL — ABNORMAL HIGH (ref 70–99)
Glucose-Capillary: 220 mg/dL — ABNORMAL HIGH (ref 70–99)
Glucose-Capillary: 86 mg/dL (ref 70–99)
Glucose-Capillary: 198 mg/dL — ABNORMAL HIGH (ref 70–99)

## 2020-01-13 MED ORDER — BOOST / RESOURCE BREEZE PO LIQD CUSTOM
1.0000 | Freq: Three times a day (TID) | ORAL | Status: DC
  Administered 2020-01-13 – 2020-01-15 (×7): 1 via ORAL

## 2020-01-13 NOTE — Progress Notes (Signed)
Nutrition Follow-up  DOCUMENTATION CODES:   Severe malnutrition in context of chronic illness, Underweight  INTERVENTION:  - will increase Boost Breeze from BID to TID. - continue Magic Cup once/day.   NUTRITION DIAGNOSIS:   Severe Malnutrition related to chronic illness (COPD and lung cancer) as evidenced by moderate fat depletion, moderate muscle depletion, severe fat depletion, severe muscle depletion. -ongoing  GOAL:   Patient will meet greater than or equal to 90% of their needs -met on average  MONITOR:   PO intake, Supplement acceptance, Labs, Weight trends  ASSESSMENT:   74 yo male with medical history of HTN, endocarditis, dyslipidemia, septal defect, pulmonary HTN, CVA  without residual deficits, afib, s/p mitral valve replacement, CHF, COPD, MI, DM, and lung cancer (NSCLC). He presented from rehab center with abdominal pain and maroon colored stool. He was admitted to ICU. CT abdomen/pelvis done on 5/28 showed possible cecal mass.  Inconsistent documentation of meal intakes. Of intakes documented, he is mainly eating 100% of meals. He has been accepting Boost Breeze ~100% of the time offered.   Weight has been stable over the past week. Patient working with PT at the time of visit. He has not received breakfast tray--will call to check on this. [facility] informed RD that patient has not placed an order; will ask patient what he would like and order breakfast shortly.   Per notes: - lower GIB with no desired for endoscopic eval - acute on chronic CHF - BPH with urinary retention  - deconditioning with ongoing ambulatory  - plan for LTAC at the time of d/c.    Labs reviewed; CBG: 184 mg/dl, Na: 130 mmol/l, Cl: 94 mmol/l, Ca: 7.8 mg/dl. Medications reviewed; 80 mg oral lasix/day, sliding scale novolog, 1000 mcg oral cyanocobalamin/day.    Diet Order:   Diet Order            Diet Heart Room service appropriate? Yes; Fluid consistency: Thin; Fluid restriction: 1800 mL  Fluid  Diet effective now                 EDUCATION NEEDS:   No education needs have been identified at this time  Skin:  Skin Assessment: Skin Integrity Issues: Skin Integrity Issues:: Stage I, Stage II Stage I: L + R coccyx Stage II: L ear; bilateral coccyx  Last BM:  7/4  Height:   Ht Readings from Last 1 Encounters:  12/24/19 6' 1"  (1.854 m)    Weight:   Wt Readings from Last 1 Encounters:  01/13/20 67.3 kg    Estimated Nutritional Needs:  Kcal:  1850-2040 kcal Protein:  90-105 grams Fluid:  >/= 2 L/day     Jarome Matin, MS, RD, LDN, CNSC Inpatient Clinical Dietitian RD pager # available in [facility]  After hours/weekend pager # available in [facility]

## 2020-01-13 NOTE — Progress Notes (Signed)
Daily Progress Note   Patient Name: [name]       Date: 01/13/2020 DOB: 12-Aug-1945  Age: 74 y.o. MRN#: [id number] Attending Physician: Little Ishikawa, MD Primary Care Physician: [name], NP Admit Date: 12/24/2019  Reason for Consultation/Follow-up: Establishing goals of care  Subjective: I met today with [name].   He was sitting in bedside chair on my arrival.  He denied any complaints including denying any pain or shortness of breath.  Discussed again regarding options for care moving forward.  He remains invested in plan for continuation of high flow nasal cannula at this point in time.  He reports he is awaiting transfer to [facility] when bed is available.  He is still not interested in discussion of residential hospice.  Length of Stay: 20  Current Medications: Scheduled Meds:  . bethanechol  10 mg Oral TID  . budesonide (PULMICORT) nebulizer solution  0.5 mg Nebulization BID  . Chlorhexidine Gluconate Cloth  6 each Topical Daily  . citalopram  10 mg Oral Daily  . dextromethorphan-guaiFENesin  1 tablet Oral BID  . digoxin  0.125 mg Oral Daily  . feeding supplement  1 Container Oral TID BM  . furosemide  80 mg Oral Daily  . insulin aspart  0-5 Units Subcutaneous QHS  . insulin aspart  0-9 Units Subcutaneous TID WC  . ipratropium-albuterol  3 mL Nebulization TID  . mouth rinse  15 mL Mouth Rinse BID  . pantoprazole  40 mg Oral Daily  . tamsulosin  0.4 mg Oral QPC supper  . cyanocobalamin  1,000 mcg Oral Daily    Continuous Infusions:   PRN Meds: albuterol, lip balm, liver oil-zinc oxide, ondansetron (ZOFRAN) IV, oxyCODONE-acetaminophen, polyethylene glycol, senna-docusate  Physical Exam         General: Awake, alert, NAD, on high flow [place], sitting in  bedside chair Heart: Regular rate and rhythm. No murmur appreciated. Lungs: Decreased, scattered crackles  Ext: Trace edema Skin: Warm and dry Neuro: Grossly intact, nonfocal.   Vital Signs: BP (!) 121/47 (BP Location: Right Arm)   Pulse 74   Temp 98 F (36.7 C) (Oral)   Resp (!) 21   Ht _0  (1.854 m)   Wt 67.3 kg   SpO2 (!) 88%   BMI 19.57 kg/m  SpO2: SpO2: Mar[place] Kitchen)  88 % O2 Device: O2 Device: High Flow Nasal Cannula O2 Flow Rate: O2 Flow Rate (L/min): 30 L/min  Intake/output summary:   Intake/Output Summary (Last 24 hours) at 01/13/2020 1948 Last data filed at 01/13/2020 0600 Gross per 24 hour  Intake --  Output 250 ml  Net -250 ml   LBM: Last BM Date: 01/12/20 Baseline Weight: Weight: 61.7 kg Most recent weight: Weight: 67.3 kg       Palliative Assessment/Data: PPS 40%    Flowsheet Rows     Most Recent Value  Intake Tab  Referral Department Hospitalist  Unit at Time of Referral ICU  Date Notified 12/24/19  Palliative Care Type Return patient Palliative Care  Reason for referral Clarify Goals of Care  Date of Admission 12/24/19  Date first seen by Palliative Care 12/27/19  # of days Palliative referral response time 3 Day(s)  # of days IP prior to Palliative referral 0  Clinical Assessment  Psychosocial & Spiritual Assessment  Palliative Care Outcomes      Patient Active Problem List   Diagnosis Date Noted  . Generalized pain   . General weakness   . Shortness of breath   . Hemorrhagic shock ([place])   . SOB (shortness of breath)   . Hypoxia   . Protein-calorie malnutrition, severe 12/25/2019  . Gastrointestinal hemorrhage 12/24/2019  . Acute blood loss anemia   . Pressure ulcer 12/08/2019  . Depression, recurrent ([place])   . Acute respiratory failure with hypoxia ([place])   . Goals of care, counseling/discussion   . Palliative care by specialist   . Acute exacerbation of CHF (congestive heart failure) ([place]) 11/13/2019  . Elevated troponin   . Chronic  atrial fibrillation ([place])   . Weakness generalized   . DNR (do not resuscitate)   . Acute on chronic combined systolic (congestive) and diastolic (congestive) heart failure ([place]) 04/27/2019  . COPD with acute exacerbation ([place]) 08/01/2018  . Pulmonary edema 07/30/2018  . Health care maintenance 05/09/2018  . Gastritis and gastroduodenitis   . Diverticulum of duodenum   . Aortic root dilatation ([place])   . 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 Atherosclerosis of aorta ([place]) 08/11/2016  . Orthostatic hypotension 09/26/2015  . Near syncope 09/24/2015  . Syncope 09/24/2015  . Hepatic cirrhosis ([place]) 09/25/2014  . History of colonic polyps 09/25/2014  . Serum total bilirubin elevated 06/30/2014  . Follow-up examination, following unspecified surgery 01/27/2014  . Lung cancer ([place]) 11/29/2013  . Prosthetic valve dysfunction   . Chronic combined systolic and diastolic CHF (congestive heart failure) ([place])   . Type 2 diabetes mellitus ([place]) 11/04/2013  . Myocardial infarction ([place]) 11/01/2013  . Cardiomyopathy, nonischemic ([place]) 11/01/2013  . Acute on chronic respiratory failure with hypoxia ([place]) 08/15/2013  . H/O intracranial hemorrhage 08/13/2013  . H/O endocarditis 08/13/2013  . H/O: CVA (cerebrovascular accident) 08/13/2013  . Community acquired pneumonia 08/13/2013  . Chronic respiratory failure assoc with chf/ PAH 08/12/2013  . H/O atrioventricular nodal ablation   . S/P MVR (mitral valve replacement)   . Hypertension associated with diabetes ([place])   . Hyperlipidemia associated with type 2 diabetes mellitus ([place])   . Permanent atrial fibrillation   . Ejection fraction < 50%   . ICD (implantable cardioverter-defibrillator), biventricular, in situ   . Renal artery stenosis ([place])   . Pulmonary hypertension ([place])   . Primary cancer of left upper lobe of lung ([place]) 04/20/2009  . COLONIC POLYPS, ADENOMATOUS 03/22/2007  . COPD GOLD II 01/11/2007  .  GERD 01/11/2007    Palliative  Care Assessment & Plan   Patient Profile: 74 y.o.malewith past medical history of combined CHF with EF 25%, status post AICD, severe COPD/emphysema, chronic respiratory failure on 5 L at skilled facility, permanent A. fib on ASA, intercranial hemorrhage, mitral regurg status post bioprosthetic valve replacement, non-small cell lung cancer status post wedge resection, DM type II, hypertension, hyperlipidemiaadmitted on 6/15/2021with abdominal pain and maroon-colored stool. Of note he recently had CT of abdomen pelvis that showed possible cecal mass. He has declined consideration for colonoscopy as he been told in the past he would be high risk for any sort of sedating procedure.  Has continued to have increased O2 requirement and now on HFNC.  Palliative asked to revisit to discuss further regarding GOC and plan for LTACH vs transition to hospice services.  Recommendations/Plan:  Continue DNR/DNI  Continue current interventions including low dose opioids for pain or shortness of breath.  Reviewed again options for discharge including LTAC versus residential hospice.  He remains invested in plan to transition to LTAC.  As disposition goal is clear, palliative care will sign off.  However, we always are happy to reengage with [name] care if we can be of further assistance.  Please reconsult or call if we can be of further assistance.  Discussed with Dr. Avon Gully.    Code Status:    Code Status Orders  (From admission, onward)         Start     Ordered   12/24/19 0843  Do not attempt resuscitation (DNR)  Continuous       Question Answer Comment  In the event of cardiac or respiratory ARREST Do not call a "code blue"   In the event of cardiac or respiratory ARREST Do not perform Intubation, CPR, defibrillation or ACLS   In the event of cardiac or respiratory ARREST Use medication by any route, position, wound care, and other measures to relive pain and suffering. May use oxygen,  suction and manual treatment of airway obstruction as needed for comfort.      12/24/19 0842        Code Status History    Date Active Date Inactive Code Status Order ID Comments User Context   12/17/2019 1137 12/24/2019 0515 DNR [id number]  Gayland Curry, DO Outpatient   12/06/2019 2335 12/13/2019 0055 DNR [id number]  Lenore Cordia, MD ED   11/13/2019 2118 11/25/2019 1857 DNR [id number]  Truddie Hidden, MD ED   11/13/2019 2114 11/13/2019 2118 Full Code [id number]  Truddie Hidden, MD ED   06/07/2019 1321 06/10/2019 2108 DNR [id number]  Karmen Bongo, MD ED   04/27/2019 1937 05/02/2019 1915 DNR [id number]  Lenore Cordia, MD ED   07/30/2018 1722 08/02/2018 1814 DNR [id number]  Cristal Deer, MD Inpatient   09/24/2015 1730 09/27/2015 1751 Full Code [id number]  Kelvin Cellar, MD Inpatient   11/29/2013 1523 12/03/2013 1401 Full Code [id number]  [name], MD Inpatient   08/12/2013 1804 08/18/2013 1820 Full Code [id number]  Barrett, Evelene Croon, PA-C Inpatient   Advance Care Planning Activity    Advance Directive Documentation     Most Recent Value  Type of Advance Directive Out of facility DNR (pink MOST or yellow form)  Pre-existing out of facility DNR order (yellow form or pink MOST form) Pink MOST form placed in chart (order not valid for inpatient use)  "MOST" Form in Place? --       Prognosis:  Guarded, unable to determine  Discharge Planning:  LTAC with outpatient Palliative Care to follow  Thank you for allowing the Palliative Medicine Team to assist in the care of this patient.   Total Time 20 Prolonged Time Billed No   Greater than 50%  of this time was spent counseling and coordinating care related to the above assessment and plan.  Micheline Rough, MD    Please contact Palliative Medicine Team phone at [phone] for questions and concerns.

## 2020-01-13 NOTE — Progress Notes (Signed)
PROGRESS NOTE    [name]  XNA:[id number] DOB: [birth year] DOA: 12/24/2019 PCP: [name], NP   Brief Narrative:  74 year old with history of CHF EF 25% status post BiV ICD, severe COPD, respiratory failure, permanent A. fib status post AV node ablation, ICH, bioprosthetic MVR, DM2, NSCLC s/p resection HTN admitted for GI bleed to have cecal mass complicated by CHF exacerbation. CTA abd= neg for GI bleed. Family doesn't want EGD or C scope. Currently PRN transfusions and lasix. Palliative team following for now.  Due to failure of much improvement, patient was offered home hospice versus LTAC.  He has decided to go to [facility] currently no beds available.  Assessment & Plan:   Principal Problem:   SOB (shortness of breath) Active Problems:   Lung cancer (HCC)   Chronic atrial fibrillation (HCC)   Acute exacerbation of CHF (congestive heart failure) (HCC)   Pressure ulcer   Gastrointestinal hemorrhage   Acute blood loss anemia   Protein-calorie malnutrition, severe   Hemorrhagic shock (HCC)   Hypoxia   Generalized pain   General weakness   Shortness of breath   Acute on chronic hypoxic respiratory failure, worsening and on heated high flow nasal cannula secondary to severe COPD/emphysema History of non-small cell lung cancer status post VATS Respiratory status minimally improving per pt over past 48h Allow for permissive hypoxia to 84% given his baseline lung function Continue aggressive scheduled and as needed bronchodilators.  I-S/flutter as tolerated SpO2: 99 % O2 Flow Rate (L/min): 35 L/min FiO2 (%): 70 %  Acute on chronic combined systolic and diastolic CHF with an EF of 25 to 30% status post AICD and pacemaker, CAD, Permanent A fib, WHO group 2 and 3 pulmonary HTN, HLD. He continues to be dependent on heated high flow with sats in the mid 90s today Not on anticoagulation due to bleeding risk and history of ICH Continue home diuretics: 80 mg Lasix p.o.  daily Deactivated defibrillator function, continue bi-ventricular pacing. BiPAP as necessary.  Lower GI bleeding with concern for cecal mass noted on CT abd/pelvis 12/06/19. Does not want any endoscopic evaluation at this time.  Palliative care team following.  Suprapubic pain Resolved  Urine cultures positive for Enterococcus faecalis, UTI ruled out Denies any urinary symptoms and thus does not meet criteria for treatment per IDSA guidelines.   We will hold off on antibiotic treatment and clinically monitor  DM type II uncontrolled with hyperglycemia. Metformin on hold currently on insulin sliding scale and Accu-Chek.  Hx of Depression. Citalopram 10 mg daily  BPH, urine retention. Bethanechol 10 mg po TID, Tamsuolsin 0.4 po Daily  Deconditioning with ongoing ambulatory dysfunction. PT/OT once medically stable  History of alcoholic cirrhosis Supportive care.  Overall appears to be compensated from a hepatic standpoint  Palliative care/goals of care -Patient was reevaluated by palliative care team, patient continues to hold off on advancing towards palliative care or hospice despite his worsening respiratory status.  Patient would like to transfer to [facility] for ongoing treatment and hopeful weaning of oxygen despite our discussion that his oxygen levels have gotten worse over the past 48 hours.  His wife recently had surgery and he is awaiting for her to be cleared to visit for further discussion in person.  Stage 1 Coccyx Ulcer, pOA -Continue Wound Care  Overall poor prognosis, patient understands this.  He has very poor pulmonary reserve.  After discussing with the patient and wife-decided to go to LTAC.  DVT prophylaxis: Place and maintain  sequential compression device Start: 12/24/19 0843  Code Status: DNR Family Communication: None at bedside  Status is: Inpatient  Remains inpatient appropriate because:Hemodynamically unstable and Inpatient level of care  appropriate due to severity of illness  Dispo: The patient is from: Home              Anticipated d/c is to: LTAC              Anticipated d/c date is: imminent pending LTAC bed availability              Patient currently is medically stable to d/c.  Awaiting LTAC bed availability.  Subjective: No acute issues or events overnight, patient feels moderately improved even with permissive hypoxia.  Denies nausea, vomiting, diarrhea, constipation, headache, fevers, chills.  Examination: General: Cachectic elderly frail with bilateral temporal wasting. Appears fatigued and lethargic but A/Ox4 Respiratory: Diffuse coarse breath sounds bilaterally without accessory muscle use, on heated high flow Cardiovascular: Regular rate rhythm without murmurs rubs or gallops Abdomen: Nontender nondistended good bowel sounds Musculoskeletal: No edema noted Skin: No rashes seen Neurologic: CN 2-12 grossly intact and nonfocal  Objective: Vitals:   01/13/20 0803 01/13/20 0900 01/13/20 1000 01/13/20 1100  BP:      Pulse:  76 78 74  Resp:  20 20 17   Temp:      TempSrc:      SpO2: 99%     Weight:      Height:        Intake/Output Summary (Last 24 hours) at 01/13/2020 1127 Last data filed at 01/13/2020 0600 Gross per 24 hour  Intake --  Output 1650 ml  Net -1650 ml   Filed Weights   01/11/20 0525 01/12/20 0449 01/13/20 0500  Weight: 68.3 kg 68.3 kg 67.3 kg   Data Reviewed:   CBC: Recent Labs  Lab 01/08/20 0135 01/11/20 0521  WBC 12.1* 10.8*  HGB 7.7* 8.1*  HCT 25.8* 27.3*  MCV 85.4 84.8  PLT 258 952   Basic Metabolic Panel: Recent Labs  Lab 01/07/20 0301 01/08/20 0135 01/11/20 0521  NA 130* 133* 130*  K 4.3 4.1 3.8  CL 91* 92* 90*  CO2 29 32 31  GLUCOSE 172* 161* 169*  BUN 22 19 23   CREATININE 0.78 0.68 0.69  CALCIUM 7.9* 8.1* 7.8*  MG 2.0 2.1  --    GFR: Estimated Creatinine Clearance: 79.5 mL/min (by C-G formula based on SCr of 0.69 mg/dL). Liver Function Tests: No  results for input(s): AST, ALT, ALKPHOS, BILITOT, PROT, ALBUMIN in the last 168 hours. No results for input(s): LIPASE, AMYLASE in the last 168 hours. No results for input(s): AMMONIA in the last 168 hours. Coagulation Profile: No results for input(s): INR, PROTIME in the last 168 hours. Cardiac Enzymes: No results for input(s): CKTOTAL, CKMB, CKMBINDEX, TROPONINI in the last 168 hours. BNP (last 3 results) No results for input(s): PROBNP in the last 8760 hours. HbA1C: No results for input(s): HGBA1C in the last 72 hours. CBG: Recent Labs  Lab 01/12/20 0756 01/12/20 1212 01/12/20 1642 01/12/20 2152 01/13/20 0801  GLUCAP 156* 129* 177* 148* 184*   Lipid Profile: No results for input(s): CHOL, HDL, LDLCALC, TRIG, CHOLHDL, LDLDIRECT in the last 72 hours. Thyroid Function Tests: No results for input(s): TSH, T4TOTAL, FREET4, T3FREE, THYROIDAB in the last 72 hours. Anemia Panel: No results for input(s): VITAMINB12, FOLATE, FERRITIN, TIBC, IRON, RETICCTPCT in the last 72 hours. Sepsis Labs: No results for input(s): PROCALCITON, LATICACIDVEN in the last  168 hours.  Recent Results (from the past 240 hour(s))  Culture, Urine     Status: Abnormal   Collection Time: 01/04/20  2:33 PM   Specimen: Urine, Catheterized  Result Value Ref Range Status   Specimen Description   Final    URINE, CATHETERIZED Performed at [facility] [address] [place], [place] [address]    Special Requests   Final    Normal Performed at [facility], [place] [address], [place], [place] [address]    Culture >=100,000 COLONIES/mL ENTEROCOCCUS FAECALIS (A)  Final   Report Status 01/06/2020 FINAL  Final   Organism ID, Bacteria ENTEROCOCCUS FAECALIS (A)  Final      Susceptibility   Enterococcus faecalis - MIC*    AMPICILLIN <=2 SENSITIVE Sensitive     NITROFURANTOIN <=16 SENSITIVE Sensitive     VANCOMYCIN 2 SENSITIVE Sensitive     * >=100,000 COLONIES/mL ENTEROCOCCUS  FAECALIS    Radiology Studies: No results found.  Scheduled Meds: . bethanechol  10 mg Oral TID  . budesonide (PULMICORT) nebulizer solution  0.5 mg Nebulization BID  . Chlorhexidine Gluconate Cloth  6 each Topical Daily  . citalopram  10 mg Oral Daily  . dextromethorphan-guaiFENesin  1 tablet Oral BID  . digoxin  0.125 mg Oral Daily  . feeding supplement  1 Container Oral TID BM  . furosemide  80 mg Oral Daily  . insulin aspart  0-5 Units Subcutaneous QHS  . insulin aspart  0-9 Units Subcutaneous TID WC  . ipratropium-albuterol  3 mL Nebulization TID  . mouth rinse  15 mL Mouth Rinse BID  . pantoprazole  40 mg Oral Daily  . tamsulosin  0.4 mg Oral QPC supper  . cyanocobalamin  1,000 mcg Oral Daily   Continuous Infusions:   LOS: 20 days   Time spent: 25 mins  [facility]  If 7PM-7AM, please contact night-coverage  01/13/2020, 11:27 AM

## 2020-01-13 NOTE — Plan of Care (Signed)

## 2020-01-13 NOTE — Progress Notes (Signed)
Physical Therapy Treatment Patient Details Name: [name] MRN: [id number] DOB: 02-May-1946 Today's Date: 01/13/2020    History of Present Illness 74 yo male presented from rehab center with abdominal pain and maroon colored stool.  PCCM asked to admit to ICU.  Recent admission at [facility] where pt had CT abd/pelvis 12/06/19 that showed possible cecal mass. Pt DCed to [facility].   He is DNR/DNI.  Opted against any endoscopic procedures. PMH of CHF, COPD on 5L O2 baseline, a fib, lung cancer s/p wedge resection, DM2, HTN, HLD    PT Comments    The patient has progressively become weaker, mod assistance to Mobilize to bed edge and  Stand partially to pivot to recliner. Incontinent of BM. Patient on 35L/.70% HHFNC. Spo2 dropped to 74%. Back to 89% with rest in recliner. Continue PT for mobility.  Follow Up Recommendations  LTACH;SNF     Equipment Recommendations  None recommended by PT    Recommendations for Other Services       Precautions / Restrictions Precautions Precaution Comments: HHFNC 35 L 70%, incontinent of BM    Mobility  Bed Mobility         Supine to sit: HOB elevated;Mod assist     General bed mobility comments: assist with legs and trunk, use of bed rail  Transfers Overall transfer level: Needs assistance Equipment used: 1 person hand held assist Transfers: Sit to/from Stand;Stand Pivot Transfers Sit to Stand: Mod assist         General transfer comment: mod assist to rise from bed after multiple attempts. Finally partially stood to pivot to recliner reaching for armrest. Incontinent of BM.  Ambulation/Gait                 Stairs             Wheelchair Mobility    Modified Rankin (Stroke Patients Only)       Balance   Sitting-balance support: Feet supported;No upper extremity supported Sitting balance-Leahy Scale: Fair     Standing balance support: Bilateral upper extremity supported;During functional activity Standing  balance-Leahy Scale: Poor                              Cognition Arousal/Alertness: Awake/alert Behavior During Therapy: Flat affect;Agitated Overall Cognitive Status: Difficult to assess                                 General Comments: answers with few words, minimal engagement,states " . somewhat agitated when asked about getting up to recliner. Patient states he is waiting on breakfast(10:15). quickly pulled covers back and stated" OK. ".      Exercises      General Comments        Pertinent Vitals/Pain Faces Pain Scale: Hurts even more Pain Location: rectume/buttocks Pain Descriptors / Indicators: Grimacing Pain Intervention(s): Monitored during session;Repositioned    Home Living                      Prior Function            PT Goals (current goals can now be found in the care plan section) Acute Rehab PT Goals Patient Stated Goal: agreed to in reclier PT Goal Formulation: With patient Time For Goal Achievement: 02/12/20 Potential to Achieve Goals: Fair Progress towards PT goals: Not progressing toward goals -  comment (goals updated)    Frequency    Min 2X/week      PT Plan Current plan remains appropriate    Co-evaluation              AM-PAC PT "6 Clicks" Mobility   Outcome Measure  Help needed turning from your back to your side while in a flat bed without using bedrails?: A Lot Help needed moving from lying on your back to sitting on the side of a flat bed without using bedrails?: A Lot Help needed moving to and from a bed to a chair (including a wheelchair)?: Total Help needed standing up from a chair using your arms (e.g., wheelchair or bedside chair)?: Total Help needed to walk in hospital room?: Total Help needed climbing 3-5 steps with a railing? : Total 6 Click Score: 8    End of Session Equipment Utilized During Treatment: Oxygen Activity Tolerance: Patient limited by fatigue Patient left: in  chair;with call bell/phone within reach Nurse Communication: Mobility status PT Visit Diagnosis: Other abnormalities of gait and mobility (R26.89);Difficulty in walking, not elsewhere classified (R26.2)     Time: 6047-9987 PT Time Calculation (min) (ACUTE ONLY): 17 min  Charges:  $Therapeutic Activity: 8-22 mins                     Tresa Endo PT Acute Rehabilitation Services Pager [phone] Office [phone]    Claretha Cooper 01/13/2020, 2:14 PM

## 2020-01-14 LAB — CBC
HCT: 27 % — ABNORMAL LOW (ref 39.0–52.0)
Hemoglobin: 8 g/dL — ABNORMAL LOW (ref 13.0–17.0)
MCH: 24.9 pg — ABNORMAL LOW (ref 26.0–34.0)
MCHC: 29.6 g/dL — ABNORMAL LOW (ref 30.0–36.0)
MCV: 84.1 fL (ref 80.0–100.0)
Platelets: 299 10*3/uL (ref 150–400)
RBC: 3.21 MIL/uL — ABNORMAL LOW (ref 4.22–5.81)
RDW: 18.8 % — ABNORMAL HIGH (ref 11.5–15.5)
WBC: 9.4 10*3/uL (ref 4.0–10.5)
nRBC: 0 % (ref 0.0–0.2)

## 2020-01-14 LAB — GLUCOSE, CAPILLARY
Glucose-Capillary: 151 mg/dL — ABNORMAL HIGH (ref 70–99)
Glucose-Capillary: 130 mg/dL — ABNORMAL HIGH (ref 70–99)
Glucose-Capillary: 178 mg/dL — ABNORMAL HIGH (ref 70–99)
Glucose-Capillary: 176 mg/dL — ABNORMAL HIGH (ref 70–99)

## 2020-01-14 LAB — BASIC METABOLIC PANEL
Anion gap: 11 (ref 5–15)
BUN: 23 mg/dL (ref 8–23)
CO2: 32 mmol/L (ref 22–32)
Calcium: 8.2 mg/dL — ABNORMAL LOW (ref 8.9–10.3)
Chloride: 91 mmol/L — ABNORMAL LOW (ref 98–111)
Creatinine, Ser: 0.73 mg/dL (ref 0.61–1.24)
GFR calc Af Amer: >60 mL/min (ref >60)
GFR calc non Af Amer: >60 mL/min (ref >60)
Glucose, Bld: 151 mg/dL — ABNORMAL HIGH (ref 70–99)
Potassium: 3.8 mmol/L (ref 3.5–5.1)
Sodium: 134 mmol/L — ABNORMAL LOW (ref 135–145)

## 2020-01-14 NOTE — Progress Notes (Signed)
OT Cancellation Note  Patient Details Name: [name] MRN: [id number] DOB: 1946-02-17   Cancelled Treatment:    Reason Eval/Treat Not Completed: Other (comment) upon arrival to pt's room, being washed up in bed with NA's. Will re-attempt as schedule permits.  Delbert Phenix OT Pager: [phone] 01/14/2020, 11:02 AM

## 2020-01-14 NOTE — Progress Notes (Signed)
PROGRESS NOTE    [name]  XBM:[id number] DOB: 08-09-1945 DOA: 12/24/2019 PCP: [name], NP   Brief Narrative:  74 year old with history of CHF EF 25% status post BiV ICD, severe COPD, respiratory failure, permanent A. fib status post AV node ablation, ICH, bioprosthetic MVR, DM2, NSCLC s/p resection HTN admitted for GI bleed to have cecal mass complicated by CHF exacerbation. CTA abd= neg for GI bleed. Family doesn't want EGD or C scope. Currently PRN transfusions and lasix. Palliative team following for now.  Due to failure of much improvement, patient was offered home hospice versus LTAC.  He has decided to go to [facility] currently no beds available.  Assessment & Plan:   Principal Problem:   SOB (shortness of breath) Active Problems:   Lung cancer (HCC)   Chronic atrial fibrillation (HCC)   Acute exacerbation of CHF (congestive heart failure) (HCC)   Pressure ulcer   Gastrointestinal hemorrhage   Acute blood loss anemia   Protein-calorie malnutrition, severe   Hemorrhagic shock (HCC)   Hypoxia   Generalized pain   General weakness   Shortness of breath   Goals of care  Lengthy discussion daily with patient, family at bedside about prognosis, palliative care following, we continue to discuss possible need for palliative care or hospice in the near future given severe respiratory depression, family and patient indicate that he would like to follow with LTAC for ongoing therapy and oxygen weaning as tolerated.  Family at bedside did indicate today that if patient was able to wean to oxygen sufficient enough for discharge home they would then consider hospice at home, but do not want to discuss further hospice care at facility.  Acute on chronic hypoxic respiratory failure, worsening and on heated high flow nasal cannula secondary to severe COPD/emphysema History of non-small cell lung cancer status post VATS Respiratory status minimally improving per pt over past 48h Allow  for permissive hypoxia to 84% given his baseline lung function Continue aggressive scheduled and as needed bronchodilators.  I-S/flutter as tolerated SpO2: 91 % O2 Flow Rate (L/min): 30 L/min FiO2 (%): 65 %  Acute on chronic combined systolic and diastolic CHF with an EF of 25 to 30% status post AICD and pacemaker, CAD, Permanent A fib, WHO group 2 and 3 pulmonary HTN, HLD. He continues to be dependent on heated high flow with sats in the mid 90s today Not on anticoagulation due to bleeding risk and history of ICH Continue home diuretics: 80 mg Lasix p.o. daily Deactivated defibrillator function, continue bi-ventricular pacing. BiPAP as necessary.  Lower GI bleeding with concern for cecal mass noted on CT abd/pelvis 12/06/19. Does not want any endoscopic evaluation at this time.  Palliative care team following.  Suprapubic pain Resolved  Urine cultures positive for Enterococcus faecalis, UTI ruled out Denies any urinary symptoms and thus does not meet criteria for treatment per IDSA guidelines.   We will hold off on antibiotic treatment and clinically monitor  DM type II uncontrolled with hyperglycemia. Metformin on hold currently on insulin sliding scale and Accu-Chek.  Hx of Depression. Citalopram 10 mg daily  BPH, urine retention. Bethanechol 10 mg po TID, Tamsuolsin 0.4 po Daily  Deconditioning with ongoing ambulatory dysfunction. PT/OT once medically stable  History of alcoholic cirrhosis Supportive care.  Overall appears to be compensated from a hepatic standpoint  Palliative care/goals of care -Patient was reevaluated by palliative care team, patient continues to hold off on advancing towards palliative care or hospice despite his worsening  respiratory status.  Patient would like to transfer to [facility] for ongoing treatment and hopeful weaning of oxygen despite our discussion that his oxygen levels have gotten worse over the past 48 hours.  His wife recently had  surgery and he is awaiting for her to be cleared to visit for further discussion in person.  Stage 1 Coccyx Ulcer, pOA -Continue Wound Care  Overall poor prognosis, patient understands this.  He has very poor pulmonary reserve.  After discussing with the patient and wife-decided to go to LTAC.  DVT prophylaxis: Place and maintain sequential compression device Start: 12/24/19 0843  Code Status: DNR Family Communication: None at bedside  Status is: Inpatient  Dispo: The patient is from: Home              Anticipated d/c is to: LTAC              Anticipated d/c date is: imminent pending LTAC bed availability              Patient currently is medically stable to d/c. to LTAC -   Awaiting LTAC bed availability. Remains unsafe dispo elsewhere due to high levels of oxygen required.  Subjective: No acute issues or events overnight, patient tolerating p.o. this morning, eating 100% of breakfast no nausea, vomiting, diarrhea, constipation, headache, fevers, chills, chest pain.  Examination: General: Cachectic elderly frail with bilateral temporal wasting. Appears fatigued and lethargic but A/Ox4 Respiratory: Diffuse coarse breath sounds bilaterally without accessory muscle use, on heated high flow Cardiovascular: Regular rate rhythm without murmurs rubs or gallops Abdomen: Nontender nondistended good bowel sounds Musculoskeletal: No edema noted Skin: No rashes seen Neurologic: CN 2-12 grossly intact and nonfocal  Objective: Vitals:   01/14/20 0500 01/14/20 0751 01/14/20 0800 01/14/20 1120  BP:    130/68  Pulse:    76  Resp:    (!) 21  Temp:   (!) 97.4 F (36.3 C)   TempSrc:   Oral   SpO2:  92%  91%  Weight: 66.3 kg     Height:        Intake/Output Summary (Last 24 hours) at 01/14/2020 1134 Last data filed at 01/14/2020 0400 Gross per 24 hour  Intake 60 ml  Output 550 ml  Net -490 ml   Filed Weights   01/12/20 0449 01/13/20 0500 01/14/20 0500  Weight: 68.3 kg 67.3 kg 66.3 kg    Data Reviewed:   CBC: Recent Labs  Lab 01/08/20 0135 01/11/20 0521 01/14/20 0523  WBC 12.1* 10.8* 9.4  HGB 7.7* 8.1* 8.0*  HCT 25.8* 27.3* 27.0*  MCV 85.4 84.8 84.1  PLT 258 303 332   Basic Metabolic Panel: Recent Labs  Lab 01/08/20 0135 01/11/20 0521 01/14/20 0523  NA 133* 130* 134*  K 4.1 3.8 3.8  CL 92* 90* 91*  CO2 32 31 32  GLUCOSE 161* 169* 151*  BUN 19 23 23   CREATININE 0.68 0.69 0.73  CALCIUM 8.1* 7.8* 8.2*  MG 2.1  --   --    GFR: Estimated Creatinine Clearance: 78.3 mL/min (by C-G formula based on SCr of 0.73 mg/dL). Liver Function Tests: No results for input(s): AST, ALT, ALKPHOS, BILITOT, PROT, ALBUMIN in the last 168 hours. No results for input(s): LIPASE, AMYLASE in the last 168 hours. No results for input(s): AMMONIA in the last 168 hours. Coagulation Profile: No results for input(s): INR, PROTIME in the last 168 hours. Cardiac Enzymes: No results for input(s): CKTOTAL, CKMB, CKMBINDEX, TROPONINI in the last 168  hours. BNP (last 3 results) No results for input(s): PROBNP in the last 8760 hours. HbA1C: No results for input(s): HGBA1C in the last 72 hours. CBG: Recent Labs  Lab 01/13/20 1216 01/13/20 1701 01/13/20 2107 01/14/20 0835 01/14/20 1121  GLUCAP 220* 86 198* 151* 130*   Lipid Profile: No results for input(s): CHOL, HDL, LDLCALC, TRIG, CHOLHDL, LDLDIRECT in the last 72 hours. Thyroid Function Tests: No results for input(s): TSH, T4TOTAL, FREET4, T3FREE, THYROIDAB in the last 72 hours. Anemia Panel: No results for input(s): VITAMINB12, FOLATE, FERRITIN, TIBC, IRON, RETICCTPCT in the last 72 hours. Sepsis Labs: No results for input(s): PROCALCITON, LATICACIDVEN in the last 168 hours.  Recent Results (from the past 240 hour(s))  Culture, Urine     Status: Abnormal   Collection Time: 01/04/20  2:33 PM   Specimen: Urine, Catheterized  Result Value Ref Range Status   Specimen Description   Final    URINE, CATHETERIZED Performed  at [facility] [address], [place], [place] [address]    Special Requests   Final    Normal Performed at [facility], [place] [address] [place], [place] [address]    Culture >=100,000 COLONIES/mL ENTEROCOCCUS FAECALIS (A)  Final   Report Status 01/06/2020 FINAL  Final   Organism ID, Bacteria ENTEROCOCCUS FAECALIS (A)  Final      Susceptibility   Enterococcus faecalis - MIC*    AMPICILLIN <=2 SENSITIVE Sensitive     NITROFURANTOIN <=16 SENSITIVE Sensitive     VANCOMYCIN 2 SENSITIVE Sensitive     * >=100,000 COLONIES/mL ENTEROCOCCUS FAECALIS    Radiology Studies: No results found.  Scheduled Meds: . bethanechol  10 mg Oral TID  . budesonide (PULMICORT) nebulizer solution  0.5 mg Nebulization BID  . Chlorhexidine Gluconate Cloth  6 each Topical Daily  . citalopram  10 mg Oral Daily  . dextromethorphan-guaiFENesin  1 tablet Oral BID  . digoxin  0.125 mg Oral Daily  . feeding supplement  1 Container Oral TID BM  . furosemide  80 mg Oral Daily  . insulin aspart  0-5 Units Subcutaneous QHS  . insulin aspart  0-9 Units Subcutaneous TID WC  . ipratropium-albuterol  3 mL Nebulization TID  . mouth rinse  15 mL Mouth Rinse BID  . pantoprazole  40 mg Oral Daily  . tamsulosin  0.4 mg Oral QPC supper  . cyanocobalamin  1,000 mcg Oral Daily   Continuous Infusions:   LOS: 21 days   Time spent: 25 mins  [facility]  If 7PM-7AM, please contact night-coverage  01/14/2020, 11:34 AM

## 2020-01-14 NOTE — TOC Progression Note (Signed)
Transition of Care [facility]) - Progression Note    Patient Details  Name: [name] MRN: [id number] Date of Birth: 02-Jul-1946  Transition [facility] CM/SW Contact  Leeroy Cha, RN Phone Number: 01/14/2020, 8:15 AM  Clinical Narrative:    Still awaiting [facility] bed at [facility].   Expected Discharge Plan: Long Term Acute Care (LTAC) Barriers to Discharge: Continued Medical Work up  Expected Discharge Plan and Services Expected Discharge Plan: [place] (LTAC)   Discharge Planning Services: CM Consult Post Acute Care Choice: [place] Living arrangements for the past 2 months: [place]                                       Social Determinants of Health (SDOH) Interventions    Readmission Risk Interventions Readmission Risk Prevention Plan 11/25/2019 11/19/2019  Transportation Screening Complete Complete  PCP or Specialist Appt within 3-5 Days Complete Complete  HRI or Home Care Consult Complete Complete  Social Work Consult for [place] Planning/Counseling Complete Complete  Palliative Care Screening Complete Complete  Medication Review [employer]) Complete Complete  Some recent data might be hidden

## 2020-01-15 ENCOUNTER — Inpatient Hospital Stay
Admission: RE | Admit: 2020-01-15 | Discharge: 2020-02-09 | Disposition: E | Payer: Medicare Other | Source: Ambulatory Visit | Attending: Internal Medicine | Admitting: Internal Medicine

## 2020-01-15 DIAGNOSIS — C3490 Malignant neoplasm of unspecified part of unspecified bronchus or lung: Secondary | ICD-10-CM | POA: Diagnosis present

## 2020-01-15 DIAGNOSIS — F329 Major depressive disorder, single episode, unspecified: Secondary | ICD-10-CM | POA: Diagnosis present

## 2020-01-15 DIAGNOSIS — I482 Chronic atrial fibrillation, unspecified: Secondary | ICD-10-CM | POA: Diagnosis present

## 2020-01-15 DIAGNOSIS — I472 Ventricular tachycardia: Secondary | ICD-10-CM | POA: Diagnosis not present

## 2020-01-15 DIAGNOSIS — J9690 Respiratory failure, unspecified, unspecified whether with hypoxia or hypercapnia: Secondary | ICD-10-CM

## 2020-01-15 DIAGNOSIS — J9620 Acute and chronic respiratory failure, unspecified whether with hypoxia or hypercapnia: Secondary | ICD-10-CM | POA: Diagnosis not present

## 2020-01-15 DIAGNOSIS — R1310 Dysphagia, unspecified: Secondary | ICD-10-CM | POA: Diagnosis present

## 2020-01-15 DIAGNOSIS — J9621 Acute and chronic respiratory failure with hypoxia: Secondary | ICD-10-CM | POA: Diagnosis present

## 2020-01-15 DIAGNOSIS — K921 Melena: Secondary | ICD-10-CM | POA: Diagnosis not present

## 2020-01-15 DIAGNOSIS — J439 Emphysema, unspecified: Secondary | ICD-10-CM | POA: Diagnosis not present

## 2020-01-15 DIAGNOSIS — I110 Hypertensive heart disease with heart failure: Secondary | ICD-10-CM | POA: Diagnosis present

## 2020-01-15 DIAGNOSIS — J189 Pneumonia, unspecified organism: Secondary | ICD-10-CM | POA: Diagnosis not present

## 2020-01-15 DIAGNOSIS — R8271 Bacteriuria: Secondary | ICD-10-CM | POA: Diagnosis not present

## 2020-01-15 DIAGNOSIS — Z8679 Personal history of other diseases of the circulatory system: Secondary | ICD-10-CM | POA: Diagnosis not present

## 2020-01-15 DIAGNOSIS — E538 Deficiency of other specified B group vitamins: Secondary | ICD-10-CM | POA: Diagnosis not present

## 2020-01-15 DIAGNOSIS — R531 Weakness: Secondary | ICD-10-CM | POA: Diagnosis present

## 2020-01-15 DIAGNOSIS — I4891 Unspecified atrial fibrillation: Secondary | ICD-10-CM | POA: Diagnosis not present

## 2020-01-15 DIAGNOSIS — I5022 Chronic systolic (congestive) heart failure: Secondary | ICD-10-CM | POA: Diagnosis present

## 2020-01-15 DIAGNOSIS — E119 Type 2 diabetes mellitus without complications: Secondary | ICD-10-CM | POA: Diagnosis present

## 2020-01-15 DIAGNOSIS — R0989 Other specified symptoms and signs involving the circulatory and respiratory systems: Secondary | ICD-10-CM

## 2020-01-15 DIAGNOSIS — I252 Old myocardial infarction: Secondary | ICD-10-CM | POA: Diagnosis not present

## 2020-01-15 DIAGNOSIS — D62 Acute posthemorrhagic anemia: Secondary | ICD-10-CM | POA: Diagnosis not present

## 2020-01-15 DIAGNOSIS — I5043 Acute on chronic combined systolic (congestive) and diastolic (congestive) heart failure: Secondary | ICD-10-CM | POA: Diagnosis not present

## 2020-01-15 DIAGNOSIS — Z681 Body mass index (BMI) 19 or less, adult: Secondary | ICD-10-CM | POA: Diagnosis not present

## 2020-01-15 DIAGNOSIS — I5042 Chronic combined systolic (congestive) and diastolic (congestive) heart failure: Secondary | ICD-10-CM | POA: Diagnosis present

## 2020-01-15 DIAGNOSIS — Z952 Presence of prosthetic heart valve: Secondary | ICD-10-CM | POA: Diagnosis not present

## 2020-01-15 DIAGNOSIS — B964 Proteus (mirabilis) (morganii) as the cause of diseases classified elsewhere: Secondary | ICD-10-CM | POA: Diagnosis not present

## 2020-01-15 DIAGNOSIS — K219 Gastro-esophageal reflux disease without esophagitis: Secondary | ICD-10-CM | POA: Diagnosis present

## 2020-01-15 DIAGNOSIS — L89156 Pressure-induced deep tissue damage of sacral region: Secondary | ICD-10-CM | POA: Diagnosis present

## 2020-01-15 DIAGNOSIS — E785 Hyperlipidemia, unspecified: Secondary | ICD-10-CM | POA: Diagnosis not present

## 2020-01-15 DIAGNOSIS — E7800 Pure hypercholesterolemia, unspecified: Secondary | ICD-10-CM | POA: Diagnosis present

## 2020-01-15 DIAGNOSIS — I272 Pulmonary hypertension, unspecified: Secondary | ICD-10-CM | POA: Diagnosis not present

## 2020-01-15 DIAGNOSIS — J219 Acute bronchiolitis, unspecified: Secondary | ICD-10-CM | POA: Diagnosis not present

## 2020-01-15 DIAGNOSIS — K922 Gastrointestinal hemorrhage, unspecified: Secondary | ICD-10-CM | POA: Diagnosis present

## 2020-01-15 DIAGNOSIS — I2510 Atherosclerotic heart disease of native coronary artery without angina pectoris: Secondary | ICD-10-CM | POA: Diagnosis present

## 2020-01-15 DIAGNOSIS — E43 Unspecified severe protein-calorie malnutrition: Secondary | ICD-10-CM | POA: Diagnosis not present

## 2020-01-15 DIAGNOSIS — Z95810 Presence of automatic (implantable) cardiac defibrillator: Secondary | ICD-10-CM | POA: Diagnosis not present

## 2020-01-15 DIAGNOSIS — Z20822 Contact with and (suspected) exposure to covid-19: Secondary | ICD-10-CM | POA: Diagnosis not present

## 2020-01-15 DIAGNOSIS — Q2543 Congenital aneurysm of aorta: Secondary | ICD-10-CM | POA: Diagnosis not present

## 2020-01-15 DIAGNOSIS — R1030 Lower abdominal pain, unspecified: Secondary | ICD-10-CM | POA: Diagnosis present

## 2020-01-15 DIAGNOSIS — I4821 Permanent atrial fibrillation: Secondary | ICD-10-CM | POA: Diagnosis not present

## 2020-01-15 DIAGNOSIS — I69191 Dysphagia following nontraumatic intracerebral hemorrhage: Secondary | ICD-10-CM | POA: Diagnosis not present

## 2020-01-15 DIAGNOSIS — R0602 Shortness of breath: Secondary | ICD-10-CM | POA: Diagnosis not present

## 2020-01-15 DIAGNOSIS — J9610 Chronic respiratory failure, unspecified whether with hypoxia or hypercapnia: Secondary | ICD-10-CM | POA: Diagnosis not present

## 2020-01-15 DIAGNOSIS — C785 Secondary malignant neoplasm of large intestine and rectum: Secondary | ICD-10-CM | POA: Diagnosis present

## 2020-01-15 DIAGNOSIS — N390 Urinary tract infection, site not specified: Secondary | ICD-10-CM | POA: Diagnosis not present

## 2020-01-15 DIAGNOSIS — J90 Pleural effusion, not elsewhere classified: Secondary | ICD-10-CM | POA: Diagnosis not present

## 2020-01-15 DIAGNOSIS — E871 Hypo-osmolality and hyponatremia: Secondary | ICD-10-CM | POA: Diagnosis not present

## 2020-01-15 DIAGNOSIS — Z950 Presence of cardiac pacemaker: Secondary | ICD-10-CM | POA: Diagnosis not present

## 2020-01-15 DIAGNOSIS — J449 Chronic obstructive pulmonary disease, unspecified: Secondary | ICD-10-CM | POA: Diagnosis present

## 2020-01-15 DIAGNOSIS — L89810 Pressure ulcer of head, unstageable: Secondary | ICD-10-CM | POA: Diagnosis present

## 2020-01-15 DIAGNOSIS — K639 Disease of intestine, unspecified: Secondary | ICD-10-CM | POA: Diagnosis not present

## 2020-01-15 DIAGNOSIS — Z66 Do not resuscitate: Secondary | ICD-10-CM | POA: Diagnosis present

## 2020-01-15 DIAGNOSIS — L89151 Pressure ulcer of sacral region, stage 1: Secondary | ICD-10-CM | POA: Diagnosis not present

## 2020-01-15 DIAGNOSIS — E46 Unspecified protein-calorie malnutrition: Secondary | ICD-10-CM | POA: Diagnosis present

## 2020-01-15 HISTORY — DX: Acute and chronic respiratory failure with hypoxia: J96.21

## 2020-01-15 HISTORY — DX: Chronic systolic (congestive) heart failure: I50.22

## 2020-01-15 HISTORY — DX: Chronic atrial fibrillation, unspecified: I48.20

## 2020-01-15 HISTORY — DX: Malignant neoplasm of unspecified part of unspecified bronchus or lung: C34.90

## 2020-01-15 HISTORY — DX: Gastrointestinal hemorrhage, unspecified: K92.2

## 2020-01-15 LAB — GLUCOSE, CAPILLARY
Glucose-Capillary: 163 mg/dL — ABNORMAL HIGH (ref 70–99)
Glucose-Capillary: 160 mg/dL — ABNORMAL HIGH (ref 70–99)
Glucose-Capillary: 116 mg/dL — ABNORMAL HIGH (ref 70–99)

## 2020-01-15 NOTE — Discharge Summary (Signed)
Physician Discharge Summary  [name] KXF:[id number] DOB: 1946/03/14 DOA: 12/24/2019  PCP: [name], NP  Admit date: 12/24/2019 Discharge date: 01/13/2020  Admitted From: Home Disposition: LTAC   Recommendations for Outpatient Follow-up:  1. Follow up with PCP in 1-2 weeks 2. Follow up with cardiology 3. Follow up with pulmonology 4. Continue monitoring CBC and volume status. Currently appearing euvolemic.  Home Health: N/A Equipment/Devices: Per LTACH Discharge Condition: Stable CODE STATUS: DNR Diet recommendation: Heart healthy  Brief/Interim Summary: [name] is a 74 year old with history of permanent AFib w/p ablation and PPM, CHF EF 25% s/p ICD, severe COPD on chronic 5L O2, ICH, bioprosthetic MVR, DM2, NSCLC s/p resection, and HTN admitted for GI bleed to have cecal mass complicated by CHF exacerbation. CTA abd was neg for GI bleed. Patient and family declined EGD or colonoscopy due to his high risk for cardiopulmonary complications. Transfusions have been provided with stability in anemia. Hypoxemia has progressed and he has consistently required heated high flow oxygen throughout stay (~3 weeks) without improvement. Palliative care has been following, though full scope of care is desired by patient and family. They wish to pursue continued care at [facility] which was pursued. He is stable for discharge to [facility] on 02/05/2020.   Discharge Diagnoses:  Principal Problem:   SOB (shortness of breath) Active Problems:   Lung cancer (HCC)   Chronic atrial fibrillation (HCC)   Acute exacerbation of CHF (congestive heart failure) (HCC)   Pressure ulcer   Gastrointestinal hemorrhage   Acute blood loss anemia   Protein-calorie malnutrition, severe   Hemorrhagic shock (HCC)   Hypoxia   Generalized pain   General weakness   Shortness of breath  Acute on chronic hypoxic respiratory failure, worsening and on heated high flow nasal cannula secondary to severe  COPD/emphysema History of non-small cell lung cancer status post VATS w/wedge resection: - Respiratory status remains precarious, but not worsening currently. Recommend allowing for permissive hypoxia to 84% given his baseline lung function - Continue aggressive scheduled and as needed bronchodilators. Continue po diuretic as below.  - BiPAP as necessary. - IS/flutter as tolerated  Acute on chronic combined systolic and diastolic CHF with an EF of 25 to 30% status post AICD, permanent AFib s/p ablation and pacemaker, CAD, WHO group 2 and 3 pulmonary HTN, HLD: - Continue home diuretics: 80 mg Lasix p.o. daily - Deactivated defibrillator function, continue bi-ventricular pacing. -  Holding aspirin as below. - Continue lipitor, digoxin, prn NTG (no angina currently)  History of ICH:  - Avoid anticoagulation.   Lower GI bleeding with concern for cecal mass noted on CT abd/pelvis 12/06/19: Declines endoscopic evaluation at this time.  - Palliative care team following. - Continue PPI  Acute blood loss anemia: Due to GI bleeding as above. s/p 3u PRBC (6/15 x2, 6/19 x1) with stable hgb since that time.  - Holding aspirin given transfusion-dependent bleeding.   Suprapubic pain: Resolved  Vitamin B12 deficiency:  - Continue supplementation  Asymptomatic bacteriuria: Enterococcus faecalis on Cx, UTI ruled out: Denies any urinary symptoms and thus does not meet criteria for treatment per IDSA guidelines.   - We will hold off on antibiotic treatment and clinically monitor  DM type II uncontrolled with hyperglycemia. - Has been on sliding scale insulin with good control. This is recommended to continue at [facility].  - Can continue metformin as well  Depression: No SI - Continue citalopram 10 mg daily  BPH, urinary retention:  - Continue bethanechol10  mg po TID, tamsuolsin0.4 po Daily  Deconditioning with ongoing ambulatory dysfunction. - Continue PT/OT at [facility]  History of  alcoholic cirrhosis - Currently compensated.  Palliative care/goals of care: Poor prognosis due to severe and progressive pulmonary dysfunction has been discussed exhaustively with the patient.  - Patient was reevaluated by palliative care team, patient continues to hold off on advancing towards palliative care or hospice despite his worsening respiratory status.  Patient would like to transfer to [facility] for ongoing treatment and hopeful weaning of oxygen despite our discussion that his oxygen requirement may not improve. His wife recently had surgery and he is awaiting for her to be cleared to visit for further discussion in person. - Per prior hospitalist team:  Family at bedside did indicate today that if patient was able to wean to oxygen sufficient enough for discharge home they would then consider hospice at home, but do not want to discuss further hospice care at facility.  RN Pressure Injury Documentation: Pressure Injury 12/24/19 Coccyx Left;Right Stage 2 -  Partial thickness loss of dermis presenting as a shallow open injury with a red, pink wound bed without slough. (Active)  12/24/19   Location: Coccyx  Location Orientation: Left;Right  Staging: Stage 2 -  Partial thickness loss of dermis presenting as a shallow open injury with a red, pink wound bed without slough.  Wound Description (Comments):   Present on Admission: Yes     Pressure Injury 12/28/19 Ear Left Stage 2 -  Partial thickness loss of dermis presenting as a shallow open injury with a red, pink wound bed without slough. back of L ear lobe, redness with broken skin (Active)  12/28/19 0000  Location: Ear  Location Orientation: Left  Staging: Stage 2 -  Partial thickness loss of dermis presenting as a shallow open injury with a red, pink wound bed without slough.  Wound Description (Comments): back of L ear lobe, redness with broken skin  Present on Admission: No    Discharge Instructions  Allergies as of 01/14/2020       Reactions   Anticoagulant Compound Other (See Comments)   Pt had intracranial bleed, therefore all anticoagulation is contraindicated per Dr. Ron Parker   Other Other (See Comments)   Per Dr. Halford Chessman (Surgeon): stated that the patient cannot be put under for any surgery, as he has an enlarged aorta. He would stand only a 50/50 chance of surviving. He has lung issues, diminished lung tissue, COPD, and emphysema.   Warfarin Sodium Other (See Comments)   Pt had intracranial bleed, therefore all anticoagulation is contraindicated per Dr. Ron Parker      Medication List    STOP taking these medications   aspirin EC 81 MG tablet     TAKE these medications   acetaminophen 500 MG tablet Commonly known as: TYLENOL Take 2 tablets (1,000 mg total) by mouth every 8 (eight) hours.   atorvastatin 10 MG tablet Commonly known as: LIPITOR Take 10 mg by mouth daily.   bethanechol 10 MG tablet Commonly known as: URECHOLINE Take 1 tablet (10 mg total) by mouth 3 (three) times daily.   Breztri Aerosphere 160-9-4.8 MCG/ACT Aero Generic drug: Budeson-Glycopyrrol-Formoterol Inhale 2 puffs into the lungs in the morning and at bedtime.   citalopram 10 MG tablet Commonly known as: CELEXA Take 1 tablet (10 mg total) by mouth daily.   cyanocobalamin 1000 MCG tablet Take 1 tablet (1,000 mcg total) by mouth daily.   digoxin 0.125 MG tablet Commonly known as: LANOXIN  Take 1 tablet (0.125 mg total) by mouth daily.   furosemide 80 MG tablet Commonly known as: LASIX Take 1 tablet (80 mg total) by mouth 2 (two) times daily.   levalbuterol 0.63 MG/3ML nebulizer solution Commonly known as: XOPENEX Take 3 mLs (0.63 mg total) by nebulization 2 (two) times daily.   metFORMIN 1000 MG tablet Commonly known as: GLUCOPHAGE TAKE 1 TABLET BY MOUTH TWICE A DAY WITH MEALS   nitroGLYCERIN 0.4 MG SL tablet Commonly known as: NITROSTAT Place 0.4 mg under the tongue every 5 (five) minutes x 3 doses as needed for  chest pain.   pantoprazole 40 MG tablet Commonly known as: PROTONIX Take 1 tablet (40 mg total) by mouth daily at 6 (six) AM.   potassium chloride SA 20 MEQ tablet Commonly known as: KLOR-CON Take 2 tablets (40 mEq total) by mouth daily.   tamsulosin 0.4 MG Caps capsule Commonly known as: FLOMAX Take 1 capsule (0.4 mg total) by mouth daily after supper.       Follow-up Information    Nafziger, Tommi Rumps, NP Follow up.   Specialty: Family Medicine Contact information: [place] [address] [phone]        [name], MD .   Specialty: Cardiology Contact information: [phone] [place] [address] [phone]        [name], MD .   Specialty: Cardiology Contact information: [phone] [place] [address] [phone]              Allergies  Allergen Reactions  . Anticoagulant Compound Other (See Comments)    Pt had intracranial bleed, therefore all anticoagulation is contraindicated per Dr. Ron Parker  . Other Other (See Comments)    Per Dr. Halford Chessman (Surgeon): stated that the patient cannot be put under for any surgery, as he has an enlarged aorta. He would stand only a 50/50 chance of surviving. He has lung issues, diminished lung tissue, COPD, and emphysema.  . Warfarin Sodium Other (See Comments)    Pt had intracranial bleed, therefore all anticoagulation is contraindicated per Dr. Ron Parker    Consultations:  Cardiology  GI  Palliative care  Procedures/Studies: CT CHEST W CONTRAST  Result Date: 12/31/2019 CLINICAL DATA:  Dyspnea. EXAM: CT CHEST WITH CONTRAST TECHNIQUE: Multidetector CT imaging of the chest was performed during intravenous contrast administration. CONTRAST:  85mL OMNIPAQUE IOHEXOL 300 MG/ML  SOLN COMPARISON:  Dec 06, 2019 FINDINGS: Cardiovascular: There is a dual lead AICD. There is dilatation of the right pulmonary artery (3.7 cm). Stable marked severity  cardiomegaly is noted. No pericardial effusion. 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 Marked severity coronary artery calcification is also seen. Mediastinum/Nodes: There is stable mild to moderate severity pretracheal and AP window lymphadenopathy. Lungs/Pleura: There is marked severity emphysematous lung disease. Stable mild to moderate severity areas of atelectasis and/or infiltrate are seen within the posterior aspects of the bilateral lower lobes. Stable bilateral moderate severity pleural effusions are seen with stable loculated components. There is no evidence of a pneumothorax. Upper Abdomen: Stable left renal cysts are noted. Musculoskeletal: Chronic right-sided rib deformities are seen with multilevel degenerative changes noted throughout the thoracic spine. IMPRESSION: 1. Stable mild to moderate severity posterior bilateral lower lobe atelectasis and/or infiltrate. 2. Stable bilateral moderate severity pleural effusions with stable loculated components. 3. Marked severity cardiomegaly. 4. Stable sinus of Valsalva aneurysm. 5. 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 6.  Left renal cysts. 7. Emphysema. Emphysema (ICD10-J43.9). Electronically Signed   By: Virgina Norfolk M.D.   On: 12/31/2019 19:57   DG CHEST PORT 1 VIEW  Result Date: 12/31/2019 CLINICAL DATA:  Shortness of breath. EXAM: PORTABLE CHEST 1 VIEW COMPARISON:  12/30/2019 FINDINGS: An ICD remains in place. The cardiac silhouette remains enlarged. Widespread airspace disease in the right greater than left lungs and asymmetric left basilar volume loss have not significantly changed. There are persistent small bilateral pleural effusions. No pneumothorax is identified. IMPRESSION: Unchanged widespread bilateral airspace disease and small bilateral pleural effusions. Electronically Signed   By: Logan Bores M.D.   On: 12/31/2019 09:17   DG CHEST PORT 1 VIEW  Result Date: 12/30/2019 CLINICAL DATA:  Short of breath  EXAM: PORTABLE CHEST 1 VIEW COMPARISON:  01/06/2020 FINDINGS: Bilateral airspace disease right greater than left. Mild improvement in airspace disease on the right. No change on the left. Small bilateral pleural effusions. Left lower lobe atelectasis unchanged. AICD unchanged. IMPRESSION: Diffuse bilateral airspace disease with mild improvement on the right probable edema. Electronically Signed   By: Franchot Gallo M.D.   On: 12/30/2019 08:22   DG CHEST PORT 1 VIEW  Result Date: 12/29/2019 CLINICAL DATA:  Shortness of breath EXAM: PORTABLE CHEST 1 VIEW COMPARISON:  December 27, 2019 FINDINGS: Bilateral pulmonary infiltrates are stable on the left and mildly worsened in the right mid lung in the interval. Small right effusion. Stable cardiomegaly. No other changes. IMPRESSION: Mild worsening of right-sided pulmonary infiltrate. Stable left infiltrate. No other changes. Electronically Signed   By: Dorise Bullion III M.D   On: 12/29/2019 11:12   DG CHEST PORT 1 VIEW  Result Date: 12/27/2019 CLINICAL DATA:  Shortness of breath. EXAM: PORTABLE CHEST 1 VIEW COMPARISON:  December 24, 2019 FINDINGS: There is a dual lead AICD. Mild diffusely increased interstitial lung markings are seen with mild areas of atelectasis and/or infiltrate seen within the bilateral mid lung fields and right lung base. There is a small right pleural effusion. No pneumothorax is identified. The cardiac silhouette is moderately enlarged. The visualized skeletal structures are unremarkable. IMPRESSION: 1. Mild areas of atelectasis and/or infiltrate seen within the bilateral mid lung fields and right lung base. 2. Small right pleural effusion. Electronically Signed   By: Virgina Norfolk M.D.   On: 12/27/2019 20:27   DG Chest Port 1 View  Result Date: 12/24/2019 CLINICAL DATA:  Weakness EXAM: PORTABLE CHEST 1 VIEW COMPARISON:  Dec 06, 2019 FINDINGS: The heart size and mediastinal contours are stable. A right-sided pacemaker seen with the lead  tips in the right atrium right ventricle. Again noted is bilateral hilar adenopathy. Streaky atelectasis seen at the right lung base. Surgical suture at the left upper lung. No large airspace consolidation. No acute osseous abnormality. IMPRESSION: No active disease. Electronically Signed   By: Prudencio Pair M.D.   On: 12/24/2019 06:17   DG Abd Portable 2 Views  Result Date: 12/24/2019 CLINICAL DATA:  Weakness EXAM: PORTABLE ABDOMEN - 2 VIEW COMPARISON:  None. FINDINGS: The bowel gas pattern is normal. There is no evidence of free air. No radio-opaque calculi or other significant radiographic abnormality is seen. IMPRESSION: Negative. Electronically Signed   By: Prudencio Pair M.D.   On: 12/24/2019 06:17   CT Angio Abd/Pel W and/or Wo Contrast  Result Date: 12/24/2019 CLINICAL DATA:  GI bleed, positive fecal occult blood. EXAM: CTA ABDOMEN AND PELVIS WITHOUT AND WITH CONTRAST TECHNIQUE: Multidetector CT imaging of the abdomen  and pelvis was performed using the standard protocol during bolus administration of intravenous contrast. Multiplanar reconstructed images and MIPs were obtained and reviewed to evaluate the vascular anatomy. CONTRAST:  138mL OMNIPAQUE IOHEXOL 350 MG/ML SOLN COMPARISON:  CTA 12/06/2019 FINDINGS: VASCULAR Aorta: Heavy atheromatous calcifications. No aneurysm, dissection, or stenosis. Celiac: Short segment mild narrowing at the level of the median arcuate ligament of the diaphragm, patent distally supplying left gastric and splenic arteries. SMA: Patent, with replaced hepatic arterial supply, an anatomic variant. Renals: Duplicated left, superior dominant, both patent. Duplicated right, superior dominant, both patent. IMA: Patent Inflow: Coarse calcified atheromatous plaque throughout the iliac arterial system without high-grade stenosis or occlusion. No aneurysm or dissection. Proximal Outflow: Bilateral common femoral and visualized portions of the superficial and profunda femoral arteries  are patent without evidence of aneurysm, dissection, vasculitis or significant stenosis. Veins: Patent hepatic veins, portal vein, SMV, splenic vein, bilateral renal veins. Iliac venous system and IVC unremarkable. No venous pathology identified. Review of the MIP images confirms the above findings. NON-VASCULAR Lower chest: AICD leads partially visualized. Mild cardiomegaly. No pericardial effusion. Coronary calcifications. Coarse peripheral interstitial opacities and honeycombing in the lung bases. Stable small possible loculated right pleural effusion or thickening. Hepatobiliary: No focal liver abnormality is seen. No gallstones, gallbladder wall thickening, or biliary dilatation. Pancreas: Unremarkable. No pancreatic ductal dilatation or surrounding inflammatory changes. Spleen: Normal in size without focal abnormality. Adrenals/Urinary Tract: Adrenal glands unremarkable. Multiple low-attenuation bilateral renal lesions, some measurable of simple fluid attenuation, most present on prior studies dating back to 10/29/2013 consistent with benign lesions. No hydronephrosis or urolithiasis. Urinary bladder is distended with mild wall thickening. Stomach/Bowel: No evidence of active extravasation. The stomach is decompressed. Small bowel is nondistended. Appendix surgically absent. The colon is fluid distended without wall thickening or evident adjacent inflammatory change. Lymphatic: No abdominal or pelvic adenopathy. Reproductive: Mild prostate enlargement. Other: No ascites.  No free air. Musculoskeletal: Multilevel lumbar spondylitic change. Chronic T12 and L1 compression deformities. No definite acute fracture or worrisome bone lesion. IMPRESSION: 1. No evidence of active GI bleed  or other acute findings. 2. Stable right pleural thickening or loculated effusion. 3. Coronary and aortoiliac  atherosclerosis (ICD10-I70.0). Electronically Signed   By: Lucrezia Europe M.D.   On: 12/24/2019 07:46      Subjective: Feels about stable. Had nonproductive cough this morning which is about stable, no sputum, no fever, no chest pain or abdominal pain/N/V/D. No bleeding noted. Amenable to going to [facility] ASAP.  Discharge Exam: Vitals:   01/13/2020 0600 01/09/2020 0800  BP: (!) 119/95 112/77  Pulse: (!) 43 85  Resp: 16 16  Temp:  (!) 95.6 F (35.3 C)  SpO2:     General: Cachectic male in no distress Cardiovascular: Irreg irreg w/hyperdynamic precordium, S1, S2, no murmur or gallop. No JVD, trace pitting dependent edema.  Respiratory: Nonlabored on HHFNC, diminished throughout with no crackles or wheezing.  Abdominal: Soft, NT, ND, bowel sounds +  Labs: BNP (last 3 results) Recent Labs    12/07/19 0029 12/29/19 0211 01/02/20 0150  BNP 1,365.1* 1,473.9* 427.0*   Basic Metabolic Panel: Recent Labs  Lab 01/11/20 0521 01/14/20 0523  NA 130* 134*  K 3.8 3.8  CL 90* 91*  CO2 31 32  GLUCOSE 169* 151*  BUN 23 23  CREATININE 0.69 0.73  CALCIUM 7.8* 8.2*   Liver Function Tests: No results for input(s): AST, ALT, ALKPHOS, BILITOT, PROT, ALBUMIN in the last 168 hours. No results for input(s):  LIPASE, AMYLASE in the last 168 hours. No results for input(s): AMMONIA in the last 168 hours. CBC: Recent Labs  Lab 01/11/20 0521 01/14/20 0523  WBC 10.8* 9.4  HGB 8.1* 8.0*  HCT 27.3* 27.0*  MCV 84.8 84.1  PLT 303 299   Cardiac Enzymes: No results for input(s): CKTOTAL, CKMB, CKMBINDEX, TROPONINI in the last 168 hours. BNP: Invalid input(s): POCBNP CBG: Recent Labs  Lab 01/13/20 2107 01/14/20 0835 01/14/20 1121 01/14/20 1550 01/14/20 2116  GLUCAP 198* 151* 130* 178* 176*   D-Dimer No results for input(s): DDIMER in the last 72 hours. Hgb A1c No results for input(s): HGBA1C in the last 72 hours. Lipid Profile No results for input(s): CHOL, HDL, LDLCALC, TRIG, CHOLHDL, LDLDIRECT in the last 72 hours. Thyroid function studies No results for input(s): TSH, T4TOTAL,  T3FREE, THYROIDAB in the last 72 hours.  Invalid input(s): FREET3 Anemia work up No results for input(s): VITAMINB12, FOLATE, FERRITIN, TIBC, IRON, RETICCTPCT in the last 72 hours. Urinalysis    Component Value Date/Time   COLORURINE YELLOW 06/07/2019 1231   APPEARANCEUR CLOUDY (A) 06/07/2019 1231   LABSPEC 1.013 06/07/2019 1231   PHURINE 5.0 06/07/2019 1231   GLUCOSEU 150 (A) 06/07/2019 1231   HGBUR SMALL (A) 06/07/2019 1231   HGBUR negative 04/05/2010 0837   BILIRUBINUR NEGATIVE 06/07/2019 1231   BILIRUBINUR n 03/27/2012 1323   KETONESUR NEGATIVE 06/07/2019 1231   PROTEINUR NEGATIVE 06/07/2019 1231   UROBILINOGEN 1.0 11/28/2013 1000   NITRITE NEGATIVE 06/07/2019 1231   LEUKOCYTESUR LARGE (A) 06/07/2019 1231    Microbiology No results found for this or any previous visit (from the past 240 hour(s)).  Time coordinating discharge: Approximately 40 minutes  Patrecia Pour, MD  [facility] 01/29/2020, 10:30 AM

## 2020-01-15 NOTE — Progress Notes (Signed)
OT Cancellation Note  Patient Details Name: KHANH TANORI MRN: [id number] DOB: [birth year]   Cancelled Treatment:    Reason Eval/Treat Not Completed: Other (comment) Rn reports patient to discharge to [facility] today and reports patient not feeling well. Will f/u as able especially if patient does not discharge as planned.  Lenward Chancellor 01/26/2020, 4:38 PM

## 2020-01-16 LAB — CBC
HCT: 28.7 % — ABNORMAL LOW (ref 39.0–52.0)
Hemoglobin: 8.1 g/dL — ABNORMAL LOW (ref 13.0–17.0)
MCH: 23.7 pg — ABNORMAL LOW (ref 26.0–34.0)
MCHC: 28.2 g/dL — ABNORMAL LOW (ref 30.0–36.0)
MCV: 83.9 fL (ref 80.0–100.0)
Platelets: 300 10*3/uL (ref 150–400)
RBC: 3.42 MIL/uL — ABNORMAL LOW (ref 4.22–5.81)
RDW: 18.6 % — ABNORMAL HIGH (ref 11.5–15.5)
WBC: 8.9 10*3/uL (ref 4.0–10.5)
nRBC: 0 % (ref 0.0–0.2)

## 2020-01-16 LAB — BASIC METABOLIC PANEL
Anion gap: 6 (ref 5–15)
BUN: 13 mg/dL (ref 8–23)
CO2: 36 mmol/L — ABNORMAL HIGH (ref 22–32)
Calcium: 8.3 mg/dL — ABNORMAL LOW (ref 8.9–10.3)
Chloride: 93 mmol/L — ABNORMAL LOW (ref 98–111)
Creatinine, Ser: 0.67 mg/dL (ref 0.61–1.24)
GFR calc Af Amer: >60 mL/min (ref >60)
GFR calc non Af Amer: >60 mL/min (ref >60)
Glucose, Bld: 141 mg/dL — ABNORMAL HIGH (ref 70–99)
Potassium: 3.3 mmol/L — ABNORMAL LOW (ref 3.5–5.1)
Sodium: 135 mmol/L (ref 135–145)

## 2020-01-19 LAB — URINALYSIS, ROUTINE W REFLEX MICROSCOPIC
Bilirubin Urine: NEGATIVE
Glucose, UA: NEGATIVE mg/dL
Ketones, ur: NEGATIVE mg/dL
Nitrite: NEGATIVE
Protein, ur: NEGATIVE mg/dL
Specific Gravity, Urine: 1.011 (ref 1.005–1.030)
WBC, UA: >50 WBC/hpf — ABNORMAL HIGH (ref 0–5)
pH: 7 (ref 5.0–8.0)

## 2020-01-19 LAB — CBC
HCT: 27.6 % — ABNORMAL LOW (ref 39.0–52.0)
Hemoglobin: 7.9 g/dL — ABNORMAL LOW (ref 13.0–17.0)
MCH: 24.1 pg — ABNORMAL LOW (ref 26.0–34.0)
MCHC: 28.6 g/dL — ABNORMAL LOW (ref 30.0–36.0)
MCV: 84.1 fL (ref 80.0–100.0)
Platelets: 274 10*3/uL (ref 150–400)
RBC: 3.28 MIL/uL — ABNORMAL LOW (ref 4.22–5.81)
RDW: 18.6 % — ABNORMAL HIGH (ref 11.5–15.5)
WBC: 12.1 10*3/uL — ABNORMAL HIGH (ref 4.0–10.5)
nRBC: 0 % (ref 0.0–0.2)

## 2020-01-19 LAB — BASIC METABOLIC PANEL
Anion gap: 7 (ref 5–15)
BUN: 25 mg/dL — ABNORMAL HIGH (ref 8–23)
CO2: 36 mmol/L — ABNORMAL HIGH (ref 22–32)
Calcium: 8.5 mg/dL — ABNORMAL LOW (ref 8.9–10.3)
Chloride: 92 mmol/L — ABNORMAL LOW (ref 98–111)
Creatinine, Ser: 0.71 mg/dL (ref 0.61–1.24)
GFR calc Af Amer: >60 mL/min (ref >60)
GFR calc non Af Amer: >60 mL/min (ref >60)
Glucose, Bld: 147 mg/dL — ABNORMAL HIGH (ref 70–99)
Potassium: 4.5 mmol/L (ref 3.5–5.1)
Sodium: 135 mmol/L (ref 135–145)

## 2020-01-19 LAB — MAGNESIUM: Magnesium: 2.1 mg/dL (ref 1.7–2.4)

## 2020-01-21 ENCOUNTER — Other Ambulatory Visit (HOSPITAL_COMMUNITY): Payer: Medicare Other

## 2020-01-21 ENCOUNTER — Encounter: Payer: Self-pay | Admitting: Internal Medicine

## 2020-01-21 DIAGNOSIS — C3490 Malignant neoplasm of unspecified part of unspecified bronchus or lung: Secondary | ICD-10-CM | POA: Diagnosis present

## 2020-01-21 DIAGNOSIS — J9621 Acute and chronic respiratory failure with hypoxia: Secondary | ICD-10-CM

## 2020-01-21 DIAGNOSIS — K922 Gastrointestinal hemorrhage, unspecified: Secondary | ICD-10-CM | POA: Diagnosis present

## 2020-01-21 DIAGNOSIS — I5022 Chronic systolic (congestive) heart failure: Secondary | ICD-10-CM | POA: Diagnosis present

## 2020-01-21 DIAGNOSIS — I482 Chronic atrial fibrillation, unspecified: Secondary | ICD-10-CM | POA: Diagnosis present

## 2020-01-21 LAB — URINE CULTURE: Culture: >=100000 — AB

## 2020-01-21 NOTE — Consult Note (Signed)
Pulmonary Critical Care Medicine [place]  PULMONARY SERVICE  Date of Service: 01/21/2020  PULMONARY CRITICAL CARE CONSULT   QUEST TAVENNER  CZY:[id number]  DOB: [birth year]   DOA: 01/11/2020  Referring Physician: Merton Border, MD  HPI: [name] is a 74 y.o. male seen for follow up of Acute on Chronic Respiratory Failure.  Patient has multiple medical problems apparently was at the rehab center.  Had development of abdominal pain and also GI bleed.  Patient was admitted to the ICU for further management.  His work-up included echocardiogram which showed an ejection fraction of 25% in the past.  Patient also does have chronic COPD and has been on chronic oxygen on 5 L.  Patient also has a history of atrial fibrillation.  Patient also has a diagnosis of non-small cell lung cancer status post resection.  The family at the time declined having an EGD for cough and a colonoscopy because of cardiopulmonary risk.  Patient was transfused.  Patient eventually ended up on high flow oxygen and he was on the high flow oxygen for 3 weeks and he currently is still on high flow oxygen 35 L flow rate with an FiO2 of 85%.  Patient was transferred here to the [facility] for further management on presentation he is disheveled looking has had significant wasting based on his previous pictures in the chart.  He was on high flow oxygen  Review of Systems:  ROS performed and is unremarkable other than noted above.  Past Medical History:  Diagnosis Date  . Atrial septal defect    Closed with surgery January, 2010  . Automatic implantable cardioverter-defibrillator in situ    LV dysfunction and pacer needed for AV node lesion  . Chronic combined systolic and diastolic CHF (congestive heart failure) ([place])   . Colon polyps   . COPD (chronic obstructive pulmonary disease) (HCC)    O2- 2 liters, nasal cannula, q night   . CVA (cerebral vascular accident) ([place]) 2009   denies residual on 08/14/2013  .  Diabetes mellitus without complication ([place])    type 2  . Dyslipidemia   . Endocarditis    Bacterial, 2009  . Hypertension   . Intracranial hemorrhage (HCC)    Coumadin cannot be used because of the history of his bleed  . Lung cancer ([place]) 11/29/2013   T1N0 Stage Ia non-small cell carcinoma left lung treated with wedge resection  . Myocardial infarction ([place]) 2010  . Pacemaker    combo pacer and icd  . Permanent atrial fibrillation    Originally Coumadin use for atrial fibrillation  //   he had intracerebral hemorrhage with an INR of 2.3 June, 2009. Anticoagulation could no longer be used.  //  Rapid atrial fibrillation after inferior MI October, 2010..........Marland Kitchen AV node ablation done at that time with ICD pacemaker placed (EF 35%).   //   Left atrial appendage tied off at the time of mitral valve surgery January, 2010 (maze pro  . Pneumonia 07/2018  . Pulmonary hypertension (HCC)    Moderate  . Renal artery stenosis (HCC)    Mild by history  . 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 Spontaneous pneumothorax    right thoracotomy - distant past  . Status post minimally invasive mitral valve replacement with bioprosthetic valve    33 mm Medtronic Mosaic porcine bioprosthesis placed via right mini thoracotomy for bacterial endocarditis complicated by severe MR and CHF   . Thoracic aortic aneurysm ([place]) 08/11/2016  a - Chest CTA 1/18:  Aneurysmal dilatation of aortic root is noted at 5.1 cm.     Past Surgical History:  Procedure Laterality Date  . APPENDECTOMY    . ASD REPAIR, SECUNDUM  07/17/2008   pericardial patch closure of ASD  . AV NODE ABLATION  07/2008   for rapid atrial fib  . CARDIAC CATHETERIZATION    . CARDIAC DEFIBRILLATOR PLACEMENT  ~ [address] [place]  . CARDIAC VALVE REPLACEMENT    . CATARACT EXTRACTION W/ INTRAOCULAR LENS  IMPLANT, BILATERAL Bilateral   . ESOPHAGOGASTRODUODENOSCOPY (EGD) WITH PROPOFOL N/A 09/29/2016   Procedure: ESOPHAGOGASTRODUODENOSCOPY (EGD) WITH  PROPOFOL;  Surgeon: [name], MD;  Location: [facility] ENDOSCOPY;  Service: Endoscopy;  Laterality: N/A;  . HERNIA REPAIR    . IMPLANTABLE CARDIOVERTER DEFIBRILLATOR (ICD) GENERATOR CHANGE N/A 02/06/2012   Procedure: ICD GENERATOR CHANGE;  Surgeon: [name], MD;  Location: [facility] CATH LAB;  Service: Cardiovascular;  Laterality: N/A;  . INSERT / REPLACE / REMOVE PACEMAKER    . MASS BIOPSY Left    neck mass  . MITRAL VALVE REPLACEMENT Right 07/17/2008   59mm Medtronic Mosaic porcine bioprosthesis  . PENILE PROSTHESIS IMPLANT    . RIGHT HEART CATHETERIZATION N/A 08/16/2013   Procedure: RIGHT HEART CATH;  Surgeon: Josue Hector, MD;  Location: [facility] CATH LAB;  Service: Cardiovascular;  Laterality: N/A;  . TEE WITHOUT CARDIOVERSION N/A 09/06/2016   Procedure: TRANSESOPHAGEAL ECHOCARDIOGRAM (TEE);  Surgeon: Dorothy Spark, MD;  Location: [facility] ENDOSCOPY;  Service: Cardiovascular;  Laterality: N/A;  . THORACOTOMY Right 1970's   spontaneous pneumothorax - while in the [place]  . TOE SURGERY     left foot hammer toe  . TONSILLECTOMY    . VIDEO ASSISTED THORACOSCOPY (VATS)/WEDGE RESECTION Left 11/29/2013   Procedure: Video assisted thoracoscopy for wedge resection; mini thoracotomy;  Surgeon: [name], MD;  Location: [place];  Service: Thoracic;  Laterality: Left;    Social History:    reports that he quit smoking about 10 years ago. His smoking use included cigarettes. He has a 45.00 pack-year smoking history. He has never used smokeless tobacco. He reports that he does not drink alcohol and does not use drugs.  Family History: Non-Contributory to the present illness  Allergies  Allergen Reactions  . Anticoagulant Compound Other (See Comments)    Pt had intracranial bleed, therefore all anticoagulation is contraindicated per Dr. Ron Parker  . Other Other (See Comments)    Per Dr. Halford Chessman (Surgeon): stated that the patient cannot be put under for any surgery, as he has an enlarged aorta. He  would stand only a 50/50 chance of surviving. He has lung issues, diminished lung tissue, COPD, and emphysema.  . Warfarin Sodium Other (See Comments)    Pt had intracranial bleed, therefore all anticoagulation is contraindicated per Dr. Ron Parker    Medications: Reviewed on Rounds  Physical Exam:  Vitals: Temperature is 96.5 pulse 76 respiratory rate 24 blood pressure 95/59 saturations 98%  Ventilator Settings heated high flow FiO2 85% 35 L flow rate  . General: Comfortable at this time . Eyes: Grossly normal lids, irises & conjunctiva . ENT: grossly tongue is normal . Neck: no obvious mass . Cardiovascular: S1-S2 normal no gallop or rub . Respiratory: No rhonchi coarse breath sounds . Abdomen: Soft and nontender . Skin: no rash seen on limited exam . Musculoskeletal: not rigid . Psychiatric:unable to assess . Neurologic: no seizure no involuntary movements  Labs on Admission:  Basic Metabolic Panel: Recent Labs  Lab 01/16/20 0818 01/19/20 0500  NA 135 135  K 3.3* 4.5  CL 93* 92*  CO2 36* 36*  GLUCOSE 141* 147*  BUN 13 25*  CREATININE 0.67 0.71  CALCIUM 8.3* 8.5*  MG  --  2.1    No results for input(s): PHART, PCO2ART, PO2ART, HCO3, O2SAT in the last 168 hours.  Liver Function Tests: No results for input(s): AST, ALT, ALKPHOS, BILITOT, PROT, ALBUMIN in the last 168 hours. No results for input(s): LIPASE, AMYLASE in the last 168 hours. No results for input(s): AMMONIA in the last 168 hours.  CBC: Recent Labs  Lab 01/16/20 0818 01/19/20 0500  WBC 8.9 12.1*  HGB 8.1* 7.9*  HCT 28.7* 27.6*  MCV 83.9 84.1  PLT 300 274    Cardiac Enzymes: No results for input(s): CKTOTAL, CKMB, CKMBINDEX, TROPONINI in the last 168 hours.  BNP (last 3 results) Recent Labs    12/07/19 0029 12/29/19 0211 01/02/20 0150  BNP 1,365.1* 1,473.9* 673.2*    ProBNP (last 3 results) No results for input(s): PROBNP in the last 8760 hours.   Radiological Exams on  Admission: No results found.  Assessment/Plan Active Problems:   Acute on chronic respiratory failure with hypoxia (HCC)   Chronic HFrEF (heart failure with reduced ejection fraction) (HCC)   Chronic atrial fibrillation (HCC)   Non-small cell lung cancer (NSCLC) (HCC)   Acute GI bleeding   1. Acute on chronic respiratory failure hypoxia patient is on high flow oxygen patient is DNR not to be intubated.  Overall his prognosis is poor he has multiple medical issues including COPD history and non-small cell lung cancer with the resection.  I did review his PFTs from about 3 years ago which revealed his FEV1 to be 52%.  Suspect that since that time he has had significant decline.  We will continue with supportive care at this time 2. Chronic heart failure reduced ejection fraction monitor fluid status diuretics as needed.  We will continue to monitor. 3. Chronic atrial fibrillation rate now rate controlled we will continue with supportive care. 4. Non-small cell lung cancer history unspecified patient status post resection we will continue to follow. 5. Acute GI bleed right now appears to be still showing some decline in his hemoglobin which has drifted down to 7.9 we will continue to monitor closely.  I have personally seen and evaluated the patient, evaluated laboratory and imaging results, formulated the assessment and plan and placed orders. The Patient requires high complexity decision making with multiple systems involvement.  Case was discussed on Rounds with the Respiratory Therapy Director and the Respiratory staff Time Spent 94minutes  Fuquan Wilson A Clay Menser, MD [facility] Pulmonary Critical Care Medicine Sleep Medicine

## 2020-01-22 LAB — BASIC METABOLIC PANEL
Anion gap: 9 (ref 5–15)
BUN: 27 mg/dL — ABNORMAL HIGH (ref 8–23)
CO2: 36 mmol/L — ABNORMAL HIGH (ref 22–32)
Calcium: 8.3 mg/dL — ABNORMAL LOW (ref 8.9–10.3)
Chloride: 88 mmol/L — ABNORMAL LOW (ref 98–111)
Creatinine, Ser: 0.69 mg/dL (ref 0.61–1.24)
GFR calc Af Amer: >60 mL/min (ref >60)
GFR calc non Af Amer: >60 mL/min (ref >60)
Glucose, Bld: 209 mg/dL — ABNORMAL HIGH (ref 70–99)
Potassium: 4.1 mmol/L (ref 3.5–5.1)
Sodium: 133 mmol/L — ABNORMAL LOW (ref 135–145)

## 2020-01-22 LAB — CBC
HCT: 27.1 % — ABNORMAL LOW (ref 39.0–52.0)
Hemoglobin: 7.6 g/dL — ABNORMAL LOW (ref 13.0–17.0)
MCH: 23.8 pg — ABNORMAL LOW (ref 26.0–34.0)
MCHC: 28 g/dL — ABNORMAL LOW (ref 30.0–36.0)
MCV: 84.7 fL (ref 80.0–100.0)
Platelets: 305 10*3/uL (ref 150–400)
RBC: 3.2 MIL/uL — ABNORMAL LOW (ref 4.22–5.81)
RDW: 18.3 % — ABNORMAL HIGH (ref 11.5–15.5)
WBC: 8.1 10*3/uL (ref 4.0–10.5)
nRBC: 0 % (ref 0.0–0.2)

## 2020-01-22 NOTE — Progress Notes (Addendum)
Pulmonary Critical Care Medicine [place]   PULMONARY CRITICAL CARE SERVICE  PROGRESS NOTE  Date of Service: 01/22/2020  [name]  YIF:[id number]  DOB: 05-04-1946   DOA: 02/07/2020  Referring Physician: Merton Border, MD  HPI: [name] is a 74 y.o. male seen for follow up of Acute on Chronic Respiratory Failure.  Patient remains on heated high flow nasal cannula 35 L and 80% at this time satting well with no distress.  Medications: Reviewed on Rounds  Physical Exam:  Vitals: Pulse 80 respirations 22 BP 122/56 O2 sat 100% temp 98.0  Ventilator Settings heated high flow 35 L and 80%  . General: Comfortable at this time . Eyes: Grossly normal lids, irises & conjunctiva . ENT: grossly tongue is normal . Neck: no obvious mass . Cardiovascular: S1 S2 normal no gallop . Respiratory: No rales or rhonchi noted . Abdomen: soft . Skin: no rash seen on limited exam . Musculoskeletal: not rigid . Psychiatric:unable to assess . Neurologic: no seizure no involuntary movements         Lab Data:   Basic Metabolic Panel: Recent Labs  Lab 01/16/20 0818 01/19/20 0500 01/22/20 0705  NA 135 135 133*  K 3.3* 4.5 4.1  CL 93* 92* 88*  CO2 36* 36* 36*  GLUCOSE 141* 147* 209*  BUN 13 25* 27*  CREATININE 0.67 0.71 0.69  CALCIUM 8.3* 8.5* 8.3*  MG  --  2.1  --     ABG: No results for input(s): PHART, PCO2ART, PO2ART, HCO3, O2SAT in the last 168 hours.  Liver Function Tests: No results for input(s): AST, ALT, ALKPHOS, BILITOT, PROT, ALBUMIN in the last 168 hours. No results for input(s): LIPASE, AMYLASE in the last 168 hours. No results for input(s): AMMONIA in the last 168 hours.  CBC: Recent Labs  Lab 01/16/20 0818 01/19/20 0500 01/22/20 0705  WBC 8.9 12.1* 8.1  HGB 8.1* 7.9* 7.6*  HCT 28.7* 27.6* 27.1*  MCV 83.9 84.1 84.7  PLT 300 274 305    Cardiac Enzymes: No results for input(s): CKTOTAL, CKMB, CKMBINDEX, TROPONINI in the last 168  hours.  BNP (last 3 results) Recent Labs    12/07/19 0029 12/29/19 0211 01/02/20 0150  BNP 1,365.1* 1,473.9* 673.2*    ProBNP (last 3 results) No results for input(s): PROBNP in the last 8760 hours.  Radiological Exams: DG Chest Port 1 View  Result Date: 01/21/2020 CLINICAL DATA:  Increased rhonchi EXAM: PORTABLE CHEST 1 VIEW COMPARISON:  Radiograph 12/31/2019 FINDINGS: Stable enlarged cardiac silhouette. There is increased bibasilar airspace disease as well as bilateral pleural effusions. Airspace disease is extending in the upper lobes. IMPRESSION: Worsening multifocal pneumonia with a bibasilar predominance. Small effusions. These results will be called to the ordering clinician or representative by the Radiologist Assistant, and communication documented in the PACS or [employer]. Electronically Signed   By: Suzy Bouchard M.D.   On: 01/21/2020 11:35    Assessment/Plan Active Problems:   Acute on chronic respiratory failure with hypoxia (HCC)   Chronic HFrEF (heart failure with reduced ejection fraction) (HCC)   Chronic atrial fibrillation (HCC)   Non-small cell lung cancer (NSCLC) (HCC)   Acute GI bleeding   1. Acute on chronic respiratory failure hypoxia patient is on high flow oxygen patient is DNR not to be intubated.    Prognosis remains poor patient's FiO2 requirement is currently 80% we will continue aggressive pulmonary toilet supportive measures. 2. Chronic heart failure reduced ejection fraction monitor  fluid status diuretics as needed.  We will continue to monitor. 3. Chronic atrial fibrillation rate now rate controlled we will continue with supportive care. 4. Non-small cell lung cancer history unspecified patient status post resection we will continue to follow. 5. Acute GI bleed right now appears to be still showing some decline in his hemoglobin which has drifted down to 7.9 we will continue to monitor closely.   I have personally seen and evaluated the  patient, evaluated laboratory and imaging results, formulated the assessment and plan and placed orders. The Patient requires high complexity decision making with multiple systems involvement.  Rounds were done with the Respiratory Therapy Director and Staff therapists and discussed with nursing staff also.  Allyne Gee, MD [facility] Pulmonary Critical Care Medicine Sleep Medicine

## 2020-01-23 LAB — VANCOMYCIN, TROUGH: Vancomycin Tr: 12 ug/mL — ABNORMAL LOW (ref 15–20)

## 2020-01-23 NOTE — Progress Notes (Signed)
Pulmonary Critical Care Medicine [place]   PULMONARY CRITICAL CARE SERVICE  PROGRESS NOTE  Date of Service: 01/23/2020  [name]  SWN:[id number]  DOB: Dec 01, 1945   DOA: 01/10/2020  Referring Physician: Merton Border, MD  HPI: [name] is a 74 y.o. male seen for follow up of Acute on Chronic Respiratory Failure.  Patient currently is on high flow nasal cannula has been on 35 L of oxygen 80% FiO2.  Medications: Reviewed on Rounds  Physical Exam:  Vitals: Temperature is 98.2 pulse is 75 respiratory rate 19 blood pressure is 130/62 saturations 98%  Ventilator Settings on high flow oxygen off of the ventilator at this time  . General: Comfortable at this time . Eyes: Grossly normal lids, irises & conjunctiva . ENT: grossly tongue is normal . Neck: no obvious mass . Cardiovascular: S1 S2 normal no gallop . Respiratory: Coarse breath sounds with a few scattered rhonchi . Abdomen: soft . Skin: no rash seen on limited exam . Musculoskeletal: not rigid . Psychiatric:unable to assess . Neurologic: no seizure no involuntary movements         Lab Data:   Basic Metabolic Panel: Recent Labs  Lab 01/19/20 0500 01/22/20 0705  NA 135 133*  K 4.5 4.1  CL 92* 88*  CO2 36* 36*  GLUCOSE 147* 209*  BUN 25* 27*  CREATININE 0.71 0.69  CALCIUM 8.5* 8.3*  MG 2.1  --     ABG: No results for input(s): PHART, PCO2ART, PO2ART, HCO3, O2SAT in the last 168 hours.  Liver Function Tests: No results for input(s): AST, ALT, ALKPHOS, BILITOT, PROT, ALBUMIN in the last 168 hours. No results for input(s): LIPASE, AMYLASE in the last 168 hours. No results for input(s): AMMONIA in the last 168 hours.  CBC: Recent Labs  Lab 01/19/20 0500 01/22/20 0705  WBC 12.1* 8.1  HGB 7.9* 7.6*  HCT 27.6* 27.1*  MCV 84.1 84.7  PLT 274 305    Cardiac Enzymes: No results for input(s): CKTOTAL, CKMB, CKMBINDEX, TROPONINI in the last 168 hours.  BNP (last 3  results) Recent Labs    12/07/19 0029 12/29/19 0211 01/02/20 0150  BNP 1,365.1* 1,473.9* 673.2*    ProBNP (last 3 results) No results for input(s): PROBNP in the last 8760 hours.  Radiological Exams: No results found.  Assessment/Plan Active Problems:   Acute on chronic respiratory failure with hypoxia (HCC)   Chronic HFrEF (heart failure with reduced ejection fraction) (HCC)   Chronic atrial fibrillation (HCC)   Non-small cell lung cancer (NSCLC) (HCC)   Acute GI bleeding   1. Acute on chronic respiratory failure hypoxia plan is to continue with high flow oxygen therapy titrate as tolerated continue secretion management care.  Continue aggressive pulmonary toilet. 2. Chronic heart failure reduced ejection fraction no changes noted at this time we will continue to follow 3. Chronic atrial fibrillation rate controlled 4. Non-small cell cancer of the lung at baseline 5. Acute GI bleed no change we will continue to follow along.   I have personally seen and evaluated the patient, evaluated laboratory and imaging results, formulated the assessment and plan and placed orders. The Patient requires high complexity decision making with multiple systems involvement.  Rounds were done with the Respiratory Therapy Director and Staff therapists and discussed with nursing staff also.  Allyne Gee, MD [facility] Pulmonary Critical Care Medicine Sleep Medicine

## 2020-01-24 LAB — CBC
HCT: 28.4 % — ABNORMAL LOW (ref 39.0–52.0)
Hemoglobin: 7.8 g/dL — ABNORMAL LOW (ref 13.0–17.0)
MCH: 23 pg — ABNORMAL LOW (ref 26.0–34.0)
MCHC: 27.5 g/dL — ABNORMAL LOW (ref 30.0–36.0)
MCV: 83.8 fL (ref 80.0–100.0)
Platelets: 331 10*3/uL (ref 150–400)
RBC: 3.39 MIL/uL — ABNORMAL LOW (ref 4.22–5.81)
RDW: 18.2 % — ABNORMAL HIGH (ref 11.5–15.5)
WBC: 8.7 10*3/uL (ref 4.0–10.5)
nRBC: 0 % (ref 0.0–0.2)

## 2020-01-24 LAB — BASIC METABOLIC PANEL
Anion gap: 8 (ref 5–15)
BUN: 31 mg/dL — ABNORMAL HIGH (ref 8–23)
CO2: 36 mmol/L — ABNORMAL HIGH (ref 22–32)
Calcium: 8.5 mg/dL — ABNORMAL LOW (ref 8.9–10.3)
Chloride: 92 mmol/L — ABNORMAL LOW (ref 98–111)
Creatinine, Ser: 0.83 mg/dL (ref 0.61–1.24)
GFR calc Af Amer: >60 mL/min (ref >60)
GFR calc non Af Amer: >60 mL/min (ref >60)
Glucose, Bld: 226 mg/dL — ABNORMAL HIGH (ref 70–99)
Potassium: 4.8 mmol/L (ref 3.5–5.1)
Sodium: 136 mmol/L (ref 135–145)

## 2020-01-24 NOTE — Progress Notes (Addendum)
Pulmonary Critical Care Medicine [place]   PULMONARY CRITICAL CARE SERVICE  PROGRESS NOTE  Date of Service: 01/24/2020  [name]  TMA:[id number]  DOB: May 13, 1946   DOA: 01/25/2020  Referring Physician: Merton Border, MD  HPI: [name] is a 74 y.o. male seen for follow up of Acute on Chronic Respiratory Failure.  Remains on heated high flow nasal cannula 40 L 95% FiO2 currently satting well no distress.  Medications: Reviewed on Rounds  Physical Exam:  Vitals: Pulse 74 respirations 19 BP 106/47 O2 sat 97% temp 97.3  Ventilator Settings heated high flow 85% 4 L  . General: Comfortable at this time . Eyes: Grossly normal lids, irises & conjunctiva . ENT: grossly tongue is normal . Neck: no obvious mass . Cardiovascular: S1 S2 normal no gallop . Respiratory: No rales or rhonchi noted . Abdomen: soft . Skin: no rash seen on limited exam . Musculoskeletal: not rigid . Psychiatric:unable to assess . Neurologic: no seizure no involuntary movements         Lab Data:   Basic Metabolic Panel: Recent Labs  Lab 01/19/20 0500 01/22/20 0705 01/24/20 0618  NA 135 133* 136  K 4.5 4.1 4.8  CL 92* 88* 92*  CO2 36* 36* 36*  GLUCOSE 147* 209* 226*  BUN 25* 27* 31*  CREATININE 0.71 0.69 0.83  CALCIUM 8.5* 8.3* 8.5*  MG 2.1  --   --     ABG: No results for input(s): PHART, PCO2ART, PO2ART, HCO3, O2SAT in the last 168 hours.  Liver Function Tests: No results for input(s): AST, ALT, ALKPHOS, BILITOT, PROT, ALBUMIN in the last 168 hours. No results for input(s): LIPASE, AMYLASE in the last 168 hours. No results for input(s): AMMONIA in the last 168 hours.  CBC: Recent Labs  Lab 01/19/20 0500 01/22/20 0705 01/24/20 0618  WBC 12.1* 8.1 8.7  HGB 7.9* 7.6* 7.8*  HCT 27.6* 27.1* 28.4*  MCV 84.1 84.7 83.8  PLT 274 305 331    Cardiac Enzymes: No results for input(s): CKTOTAL, CKMB, CKMBINDEX, TROPONINI in the last 168 hours.  BNP (last 3  results) Recent Labs    12/07/19 0029 12/29/19 0211 01/02/20 0150  BNP 1,365.1* 1,473.9* 673.2*    ProBNP (last 3 results) No results for input(s): PROBNP in the last 8760 hours.  Radiological Exams: No results found.  Assessment/Plan Active Problems:   Acute on chronic respiratory failure with hypoxia (HCC)   Chronic HFrEF (heart failure with reduced ejection fraction) (HCC)   Chronic atrial fibrillation (HCC)   Non-small cell lung cancer (NSCLC) (HCC)   Acute GI bleeding   1. Acute on chronic respiratory failure hypoxia plan is to continue with high flow oxygen therapy titrate as tolerated continue secretion management care.  Continue aggressive pulmonary toilet. 2. Chronic heart failure reduced ejection fraction no changes noted at this time we will continue to follow 3. Chronic atrial fibrillation rate controlled 4. Non-small cell cancer of the lung at baseline 5. Acute GI bleed no change we will continue to follow along.   I have personally seen and evaluated the patient, evaluated laboratory and imaging results, formulated the assessment and plan and placed orders. The Patient requires high complexity decision making with multiple systems involvement.  Rounds were done with the Respiratory Therapy Director and Staff therapists and discussed with nursing staff also.  Allyne Gee, MD [facility] Pulmonary Critical Care Medicine Sleep Medicine

## 2020-01-25 NOTE — Progress Notes (Signed)
Pulmonary Critical Care Medicine [place]   PULMONARY CRITICAL CARE SERVICE  PROGRESS NOTE  Date of Service: 01/25/2020  [name]  UEA:[id number]  DOB: 25-Apr-1946   DOA: 02/07/2020  Referring Physician: Merton Border, MD  HPI: [name] is a 74 y.o. male seen for follow up of Acute on Chronic Respiratory Failure.  Patient currently is on heated high flow oxygen still remains on higher oxygen rates that I would like to see currently is on 85% FiO2 and 40 L  Medications: Reviewed on Rounds  Physical Exam:  Vitals: Temperature is 97.9 pulse 74 respiratory 21 blood pressure is 137/56 saturations 95%  Ventilator Settings on high flow nasal cannula FiO2 85% flow rate 40 L  . General: Comfortable at this time . Eyes: Grossly normal lids, irises & conjunctiva . ENT: grossly tongue is normal . Neck: no obvious mass . Cardiovascular: S1 S2 normal no gallop . Respiratory: No rhonchi coarse breath sounds . Abdomen: soft . Skin: no rash seen on limited exam . Musculoskeletal: not rigid . Psychiatric:unable to assess . Neurologic: no seizure no involuntary movements         Lab Data:   Basic Metabolic Panel: Recent Labs  Lab 01/19/20 0500 01/22/20 0705 01/24/20 0618  NA 135 133* 136  K 4.5 4.1 4.8  CL 92* 88* 92*  CO2 36* 36* 36*  GLUCOSE 147* 209* 226*  BUN 25* 27* 31*  CREATININE 0.71 0.69 0.83  CALCIUM 8.5* 8.3* 8.5*  MG 2.1  --   --     ABG: No results for input(s): PHART, PCO2ART, PO2ART, HCO3, O2SAT in the last 168 hours.  Liver Function Tests: No results for input(s): AST, ALT, ALKPHOS, BILITOT, PROT, ALBUMIN in the last 168 hours. No results for input(s): LIPASE, AMYLASE in the last 168 hours. No results for input(s): AMMONIA in the last 168 hours.  CBC: Recent Labs  Lab 01/19/20 0500 01/22/20 0705 01/24/20 0618  WBC 12.1* 8.1 8.7  HGB 7.9* 7.6* 7.8*  HCT 27.6* 27.1* 28.4*  MCV 84.1 84.7 83.8  PLT 274 305 331    Cardiac  Enzymes: No results for input(s): CKTOTAL, CKMB, CKMBINDEX, TROPONINI in the last 168 hours.  BNP (last 3 results) Recent Labs    12/07/19 0029 12/29/19 0211 01/02/20 0150  BNP 1,365.1* 1,473.9* 673.2*    ProBNP (last 3 results) No results for input(s): PROBNP in the last 8760 hours.  Radiological Exams: No results found.  Assessment/Plan Active Problems:   Acute on chronic respiratory failure with hypoxia (HCC)   Chronic HFrEF (heart failure with reduced ejection fraction) (HCC)   Chronic atrial fibrillation (HCC)   Non-small cell lung cancer (NSCLC) (HCC)   Acute GI bleeding   1. Acute on chronic respiratory failure hypoxia plan continue with try to titrate oxygen down as tolerated continue secretion management supportive care. 2. Chronic heart failure reduced ejection fraction at baseline we will continue to monitor 3. Chronic atrial fibrillation rate controlled 4. Non-small cell cancer of the lung at baseline 5. Acute GI bleed no active bleeding is noted   I have personally seen and evaluated the patient, evaluated laboratory and imaging results, formulated the assessment and plan and placed orders. The Patient requires high complexity decision making with multiple systems involvement.  Rounds were done with the Respiratory Therapy Director and Staff therapists and discussed with nursing staff also.  Allyne Gee, MD [facility] Pulmonary Critical Care Medicine Sleep Medicine

## 2020-01-26 ENCOUNTER — Other Ambulatory Visit (HOSPITAL_COMMUNITY): Payer: Medicare Other

## 2020-01-26 NOTE — Progress Notes (Signed)
Pulmonary Critical Care Medicine [place]   PULMONARY CRITICAL CARE SERVICE  PROGRESS NOTE  Date of Service: 01/26/2020  [name]  EGB:[id number]  DOB: 04/24/1946   DOA: 02/03/2020  Referring Physician: Merton Border, MD  HPI: [name] is a 74 y.o. male seen for follow up of Acute on Chronic Respiratory Failure.  Patient remains on 100% FiO2 heated high flow nasal cannula at 40 L also has a nonrebreather intermittently using.  Prognosis remains poor.  Medications: Reviewed on Rounds  Physical Exam:  Vitals: Pulse 74 respirations 24 BP 130/63 O2 sat 96% temp 98.5  Ventilator Settings ventilator mode heated high flow nasal cannula 100% FiO2 and 40 L  General: Comfortable at this time Eyes: Grossly normal lids, irises & conjunctiva ENT: grossly tongue is normal Neck: no obvious mass Cardiovascular: S1 S2 normal no gallop Respiratory: Coarse breath sounds Abdomen: soft Skin: no rash seen on limited exam Musculoskeletal: not rigid Psychiatric:unable to assess Neurologic: no seizure no involuntary movements         Lab Data:   Basic Metabolic Panel: Recent Labs  Lab 01/22/20 0705 01/24/20 0618  NA 133* 136  K 4.1 4.8  CL 88* 92*  CO2 36* 36*  GLUCOSE 209* 226*  BUN 27* 31*  CREATININE 0.69 0.83  CALCIUM 8.3* 8.5*    ABG: No results for input(s): PHART, PCO2ART, PO2ART, HCO3, O2SAT in the last 168 hours.  Liver Function Tests: No results for input(s): AST, ALT, ALKPHOS, BILITOT, PROT, ALBUMIN in the last 168 hours. No results for input(s): LIPASE, AMYLASE in the last 168 hours. No results for input(s): AMMONIA in the last 168 hours.  CBC: Recent Labs  Lab 01/22/20 0705 01/24/20 0618  WBC 8.1 8.7  HGB 7.6* 7.8*  HCT 27.1* 28.4*  MCV 84.7 83.8  PLT 305 331    Cardiac Enzymes: No results for input(s): CKTOTAL, CKMB, CKMBINDEX, TROPONINI in the last 168 hours.  BNP (last 3 results) Recent Labs    12/07/19 0029  12/29/19 0211 01/02/20 0150  BNP 1,365.1* 1,473.9* 673.2*    ProBNP (last 3 results) No results for input(s): PROBNP in the last 8760 hours.  Radiological Exams: DG CHEST PORT 1 VIEW  Result Date: 01/26/2020 CLINICAL DATA:  Respiratory failure EXAM: PORTABLE CHEST 1 VIEW COMPARISON:  01/21/2020 FINDINGS: Severe diffuse bilateral airspace disease appears unchanged. Small bilateral effusions. Pacemaker unchanged in position. IMPRESSION: Severe diffuse bilateral airspace disease appears unchanged. Electronically Signed   By: Franchot Gallo M.D.   On: 01/26/2020 08:38    Assessment/Plan Active Problems:   Acute on chronic respiratory failure with hypoxia (HCC)   Chronic HFrEF (heart failure with reduced ejection fraction) (HCC)   Chronic atrial fibrillation (HCC)   Non-small cell lung cancer (NSCLC) (HCC)   Acute GI bleeding   Acute on chronic respiratory failure hypoxia patient will continue on heated high flow nasal cannula currently 100% FiO2 with a poor prognosis.  We will continue supportive measures at this time. Chronic heart failure reduced ejection fraction at baseline we will continue to monitor Chronic atrial fibrillation rate controlled Non-small cell cancer of the lung at baseline Acute GI bleed no active bleeding is noted   I have personally seen and evaluated the patient, evaluated laboratory and imaging results, formulated the assessment and plan and placed orders. The Patient requires high complexity decision making with multiple systems involvement.  Rounds were done with the Respiratory Therapy Director and Staff therapists and discussed with nursing staff  also.  Allyne Gee, MD [facility] Pulmonary Critical Care Medicine Sleep Medicine

## 2020-01-30 ENCOUNTER — Inpatient Hospital Stay: Payer: Medicare Other

## 2020-02-01 IMAGING — US US ABDOMEN COMPLETE
1 series · 13 of 25 positions shown · non-contrast
Comparison: Abdominal ultrasound dated April 13, 2017

CLINICAL DATA: Hepatic cirrhosis, hepatoma screening.

EXAM:
ABDOMEN ULTRASOUND COMPLETE

[Series 1: us abdomen complete · 0.22mm/px · 13 of 123 slices shown]
[im 1/123]
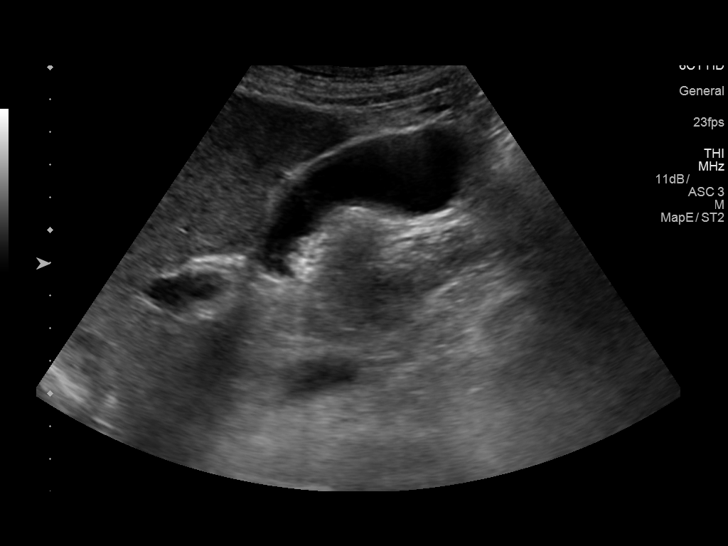
[im 11/123]
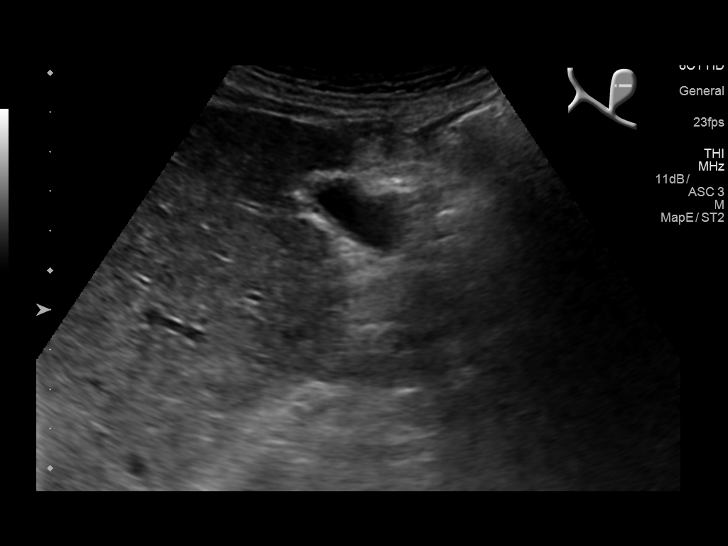
[im 21/123]
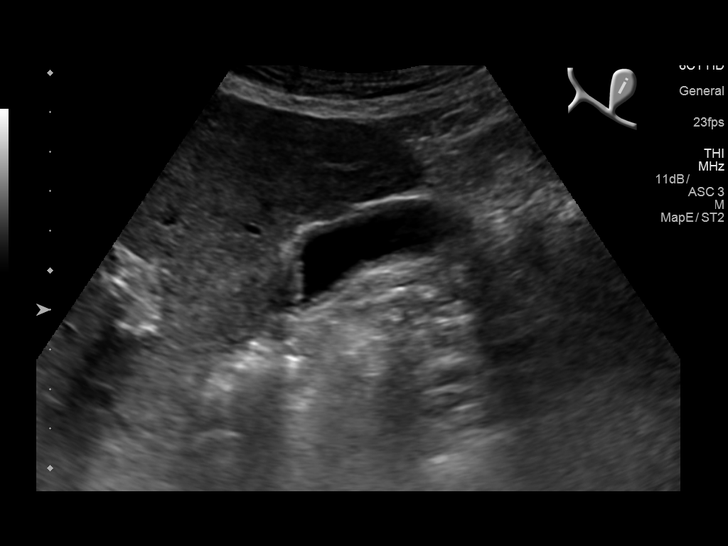
[im 31/123]
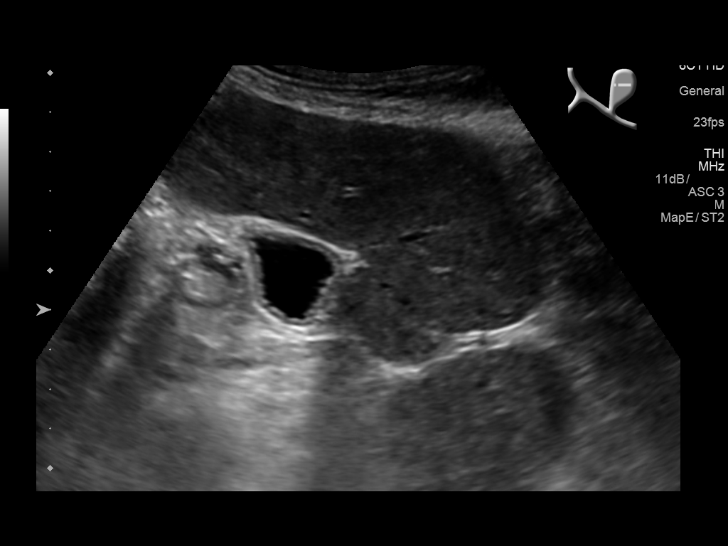
[im 41/123]
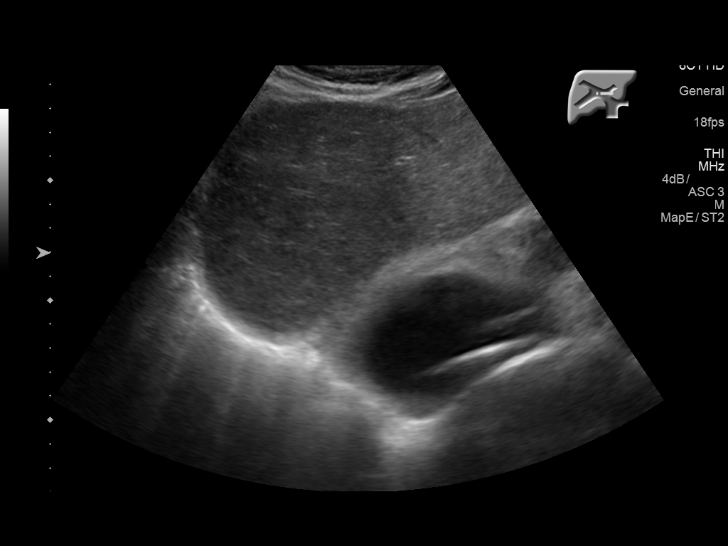
[im 51/123]
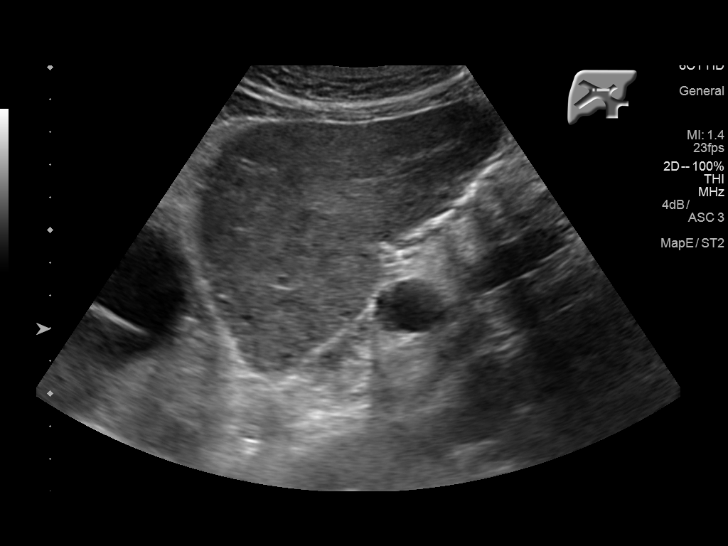
[im 62/123]
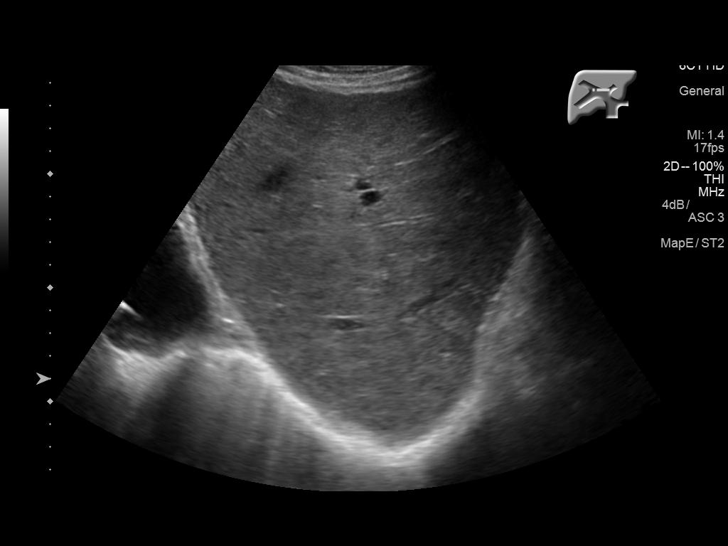
[im 72/123]
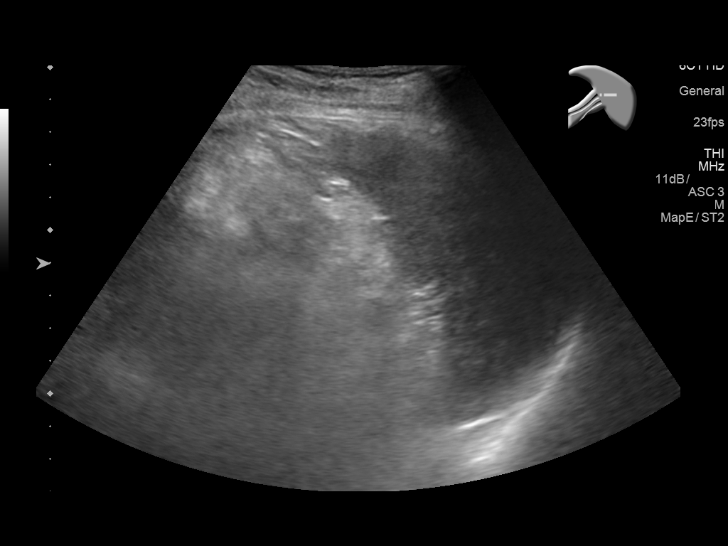
[im 82/123]
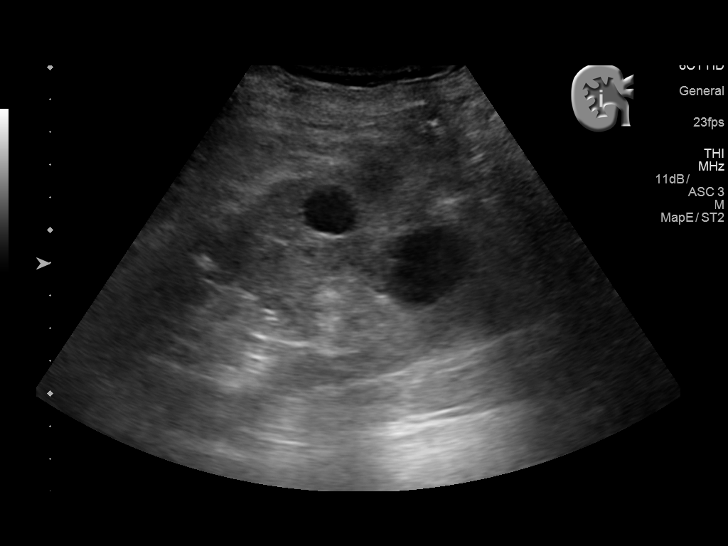
[im 92/123]
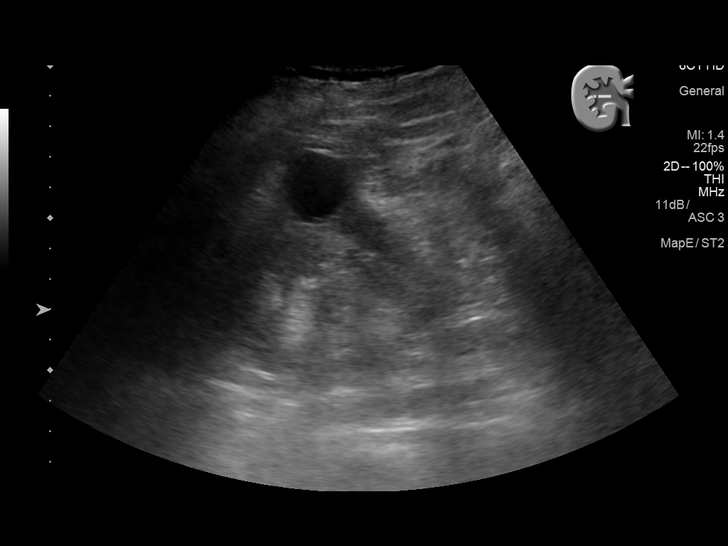
[im 102/123]
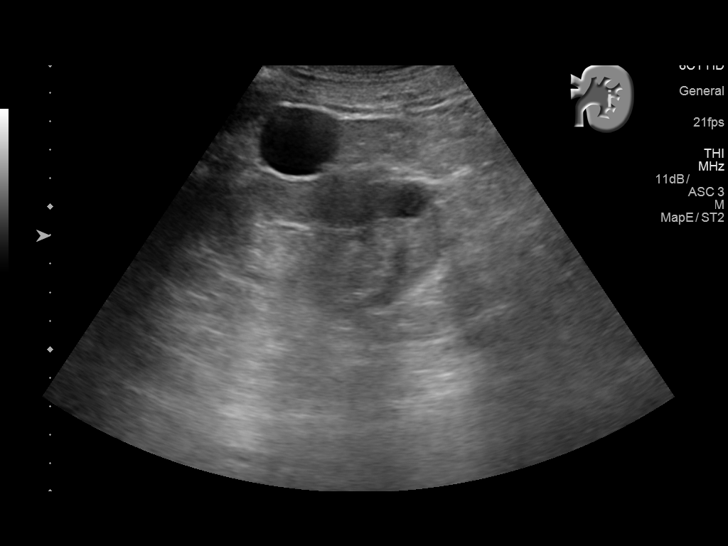
[im 112/123]
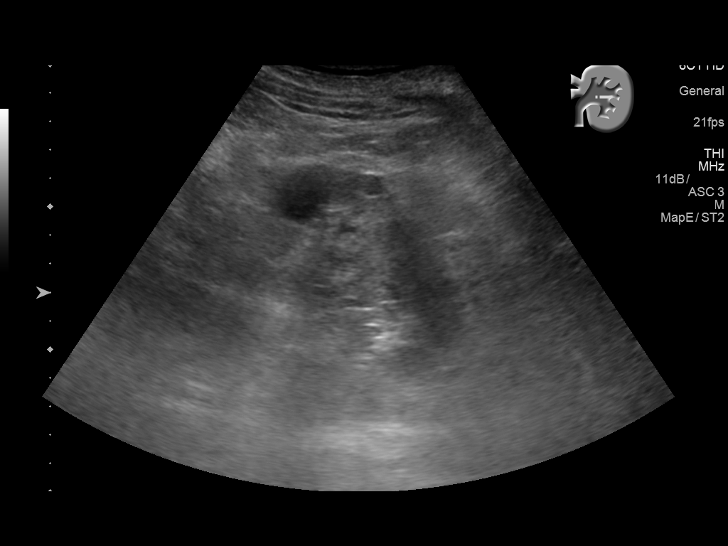
[im 123/123]
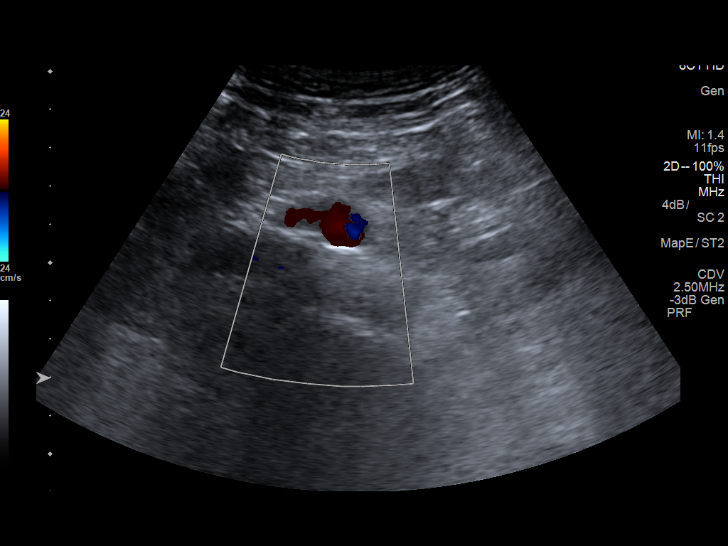

[13 of 25 positions shown; findings below may reference images not displayed]

FINDINGS: Gallbladder: The gallbladder is adequately distended. There is
subjective mild thickening of the gallbladder wall but the maximal
thickness is only 3 mm. No stones or sludge are observed. There is
no pericholecystic fluid. There is no positive sonographic Murphy's
sign.

Common bile duct: Diameter: 3.2 mm where visualized.

Liver: The hepatic echotexture is increased. The surface contour is
mildly nodular. There is no discrete mass or ductal dilation. Portal
vein is patent on color Doppler imaging with normal direction of
blood flow towards the liver.

IVC: No abnormality visualized.

Pancreas: Visualized portion unremarkable.

Spleen: Size and appearance within normal limits.

Right Kidney: Length: 13.3 cm. There is renal cortical thinning
diffusely. The echotexture remains lower than that of the liver.
There are 2 lower pole cortical cysts measuring 3.2 and 2.3 cm in
greatest dimension. No hydronephrosis.

Left Kidney: Length: 13.9 cm. The cortical echotexture is similar to
that on the right and the cortex exhibits some thinning. There are 2
lower pole cysts measuring 3.2 and 2.2 cm in greatest dimension.
There is no hydronephrosis.

Abdominal aorta: 2.2 cm

Other findings: There is no ascites.
IMPRESSION: Increased hepatic echotexture with surface contour irregularity
consistent with cirrhosis. No suspicious masses are observed. No
splenomegaly.

Mild gallbladder wall irregularity and borderline thickening without
stones or sludge.

Bilateral renal cortical thinning and simple appearing cortical
cysts. No hydronephrosis.

## 2020-02-03 ENCOUNTER — Ambulatory Visit: Payer: Medicare Other | Admitting: Internal Medicine

## 2020-02-07 ENCOUNTER — Ambulatory Visit: Payer: Medicare Other | Admitting: Cardiology

## 2020-02-09 DEATH — deceased

## 2020-02-24 IMAGING — DX DG CHEST 2V
2 series · 2 of 2 positions shown · non-contrast
Comparison: CT 12/22/2017.  Chest x-ray 09/25/2015.

CLINICAL DATA: Cough.

EXAM:
CHEST - 2 VIEW

[chest pa]
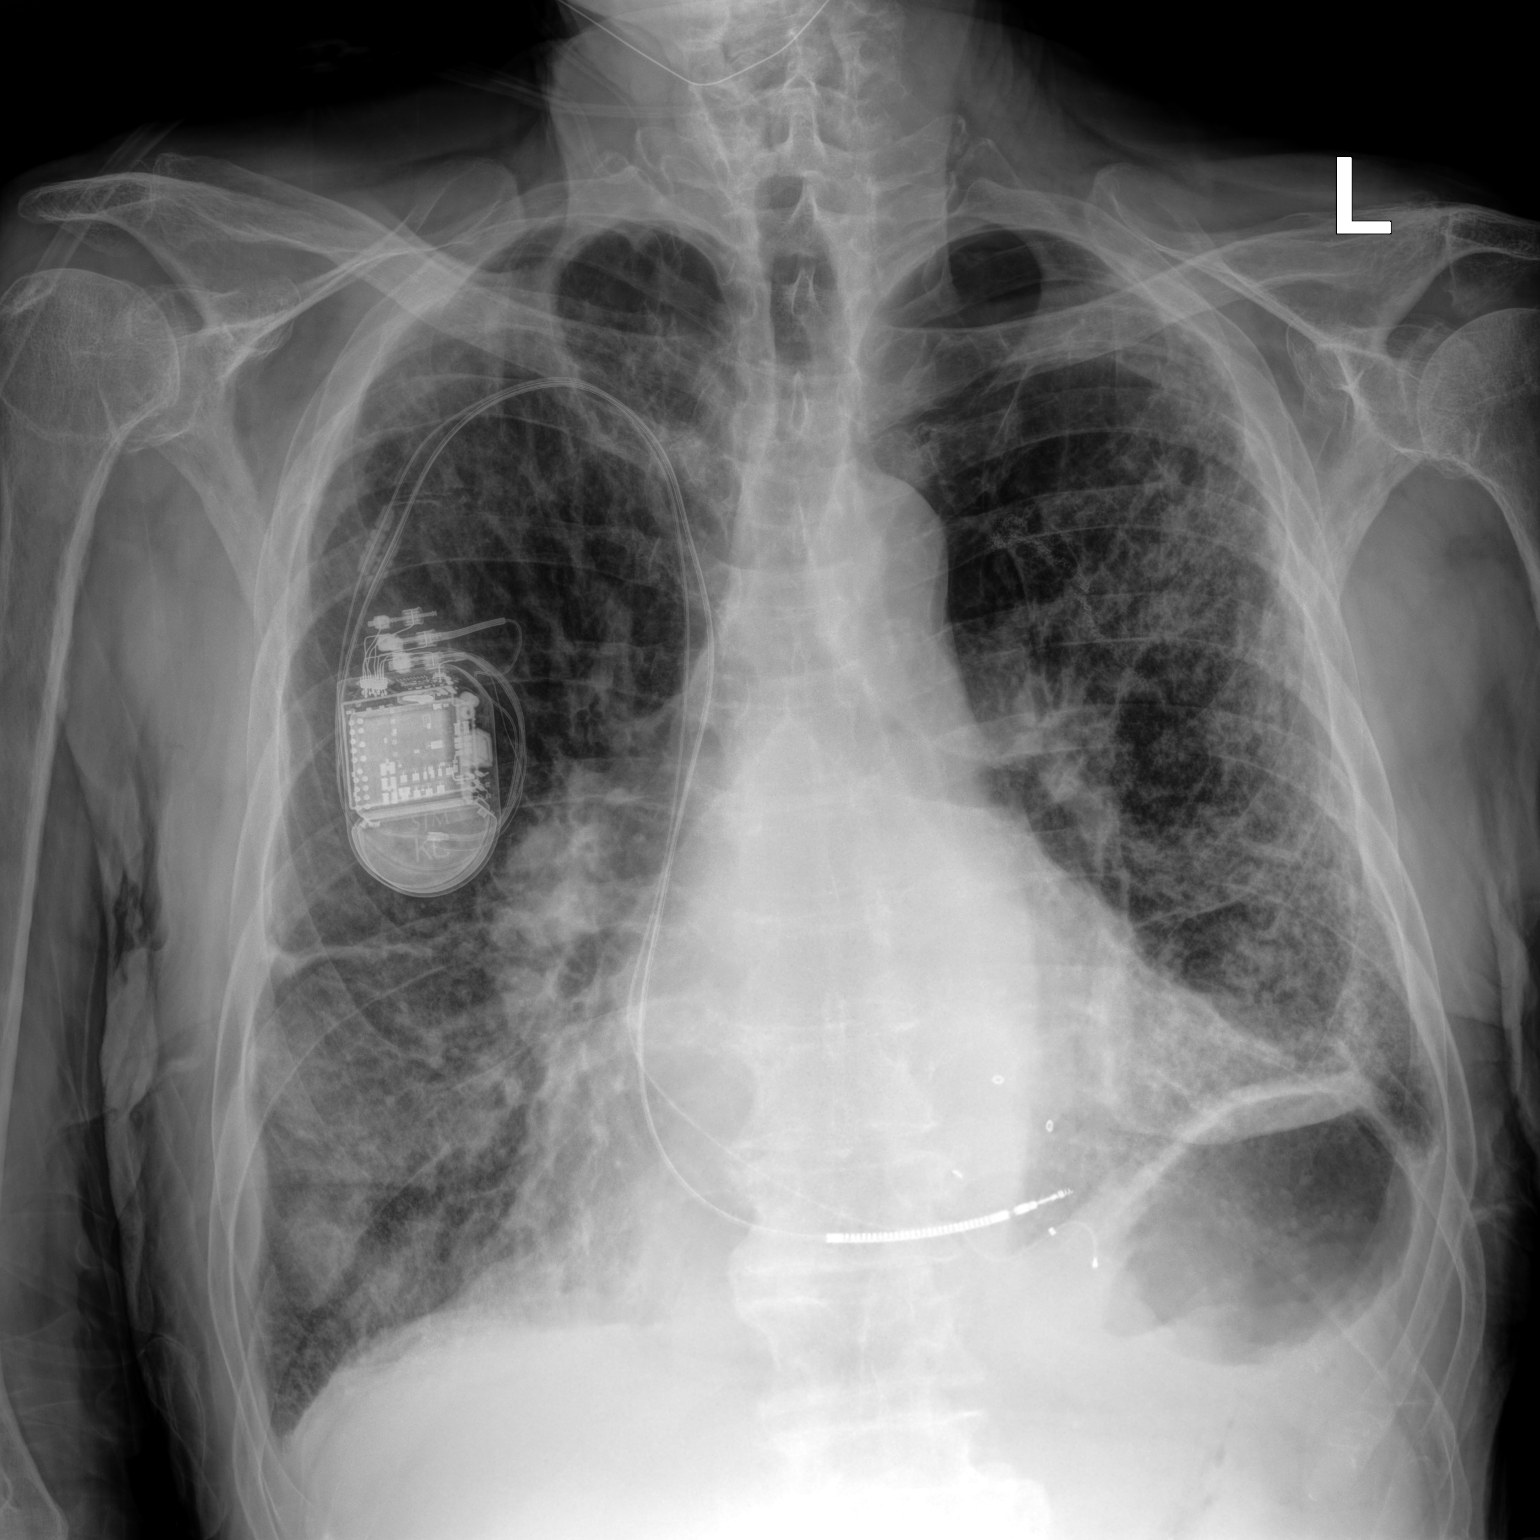

[chest lat]
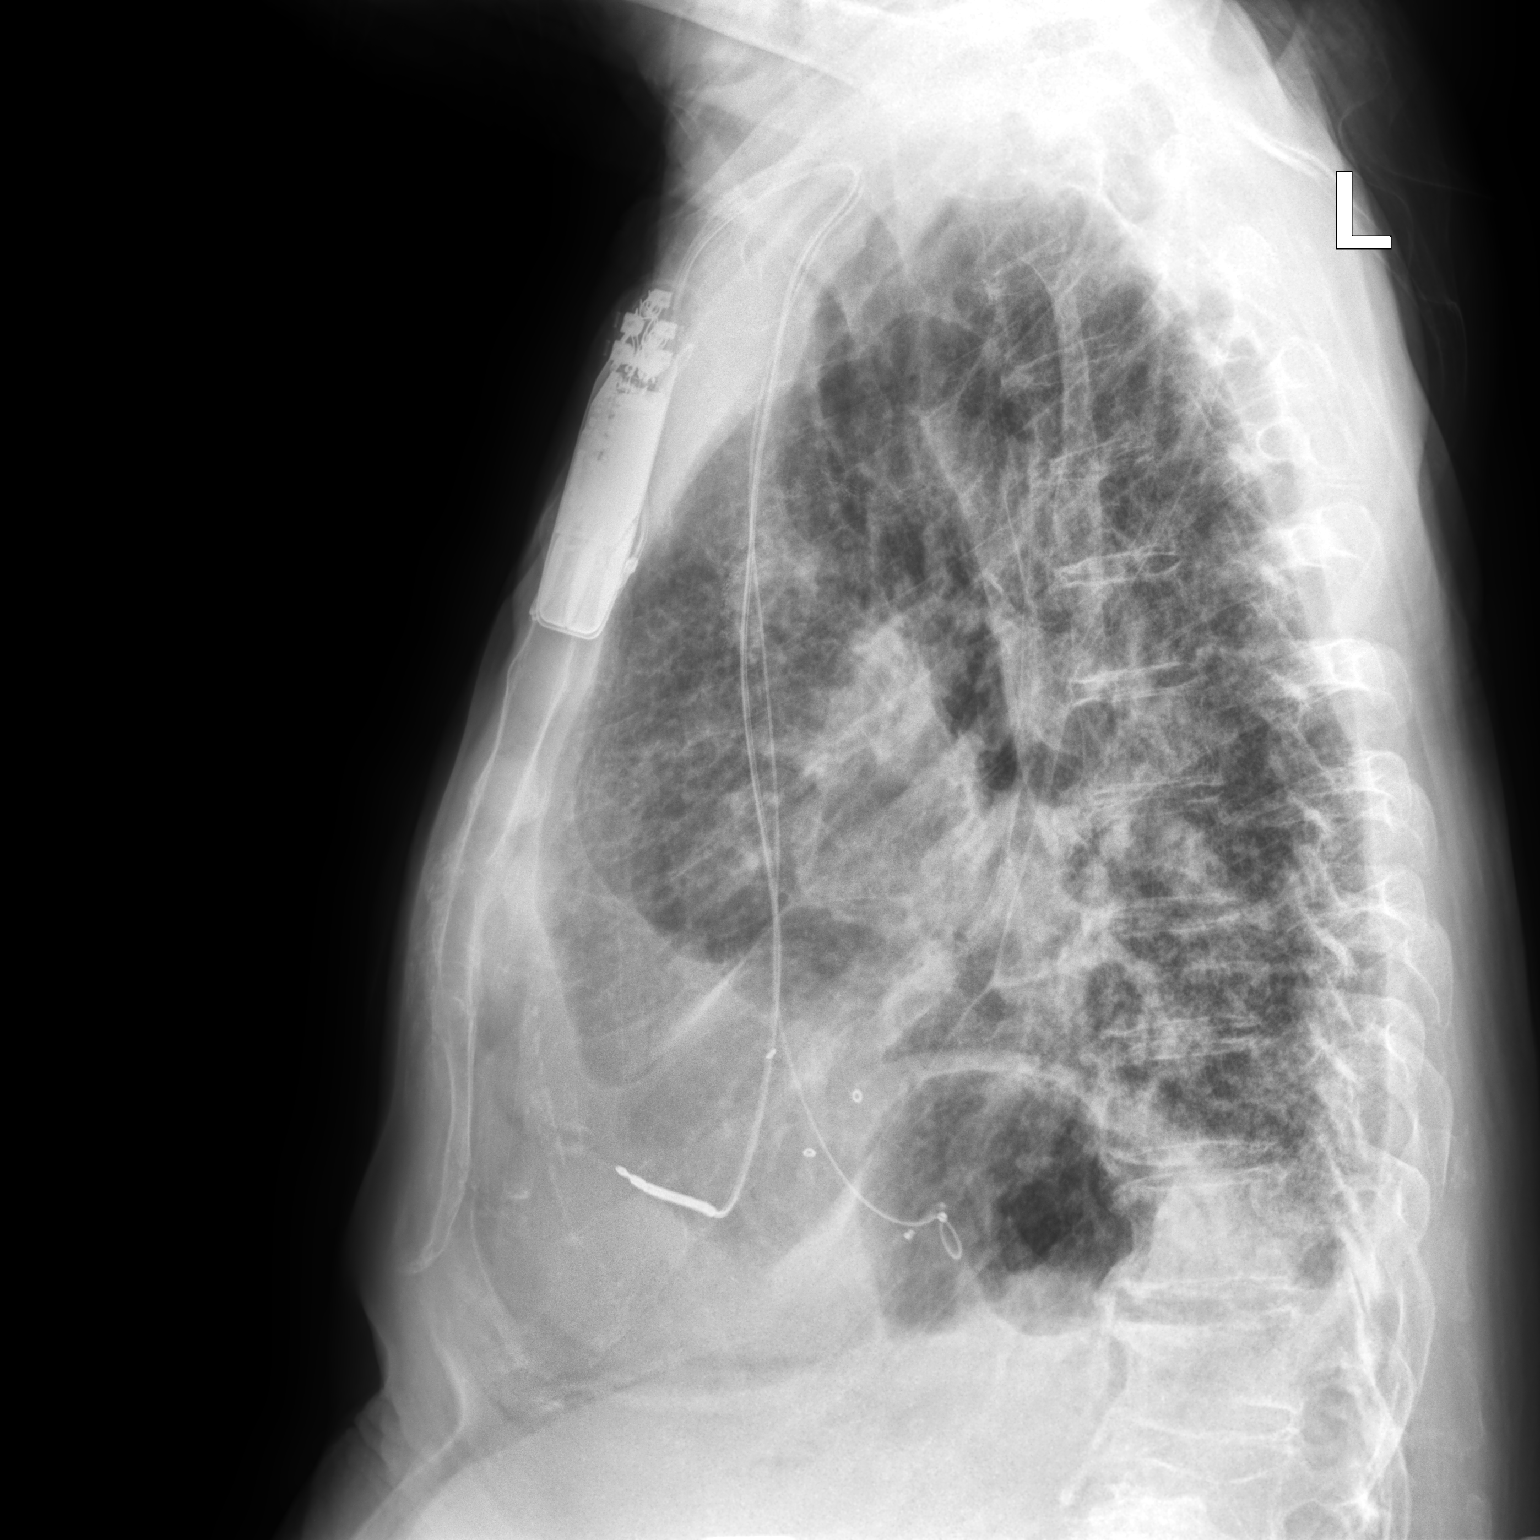

[2 of 2 positions shown; findings below may reference images not displayed]

FINDINGS: Mediastinum and hilar structures are stable. AICD in stable
position. Stable cardiomegaly. COPD. Diffuse bilateral pulmonary
interstitial prominence. Although a component these changes may be
chronic active interstitial process including
pneumonitis/interstitial edema can not be excluded. Tiny left
pleural effusion. Small bilateral pleural effusions. Stable
elevation left hemidiaphragm. Mild scoliosis thoracic spine.
Osteopenia and degenerative changes thoracic spine. Lower thoracic
upper lumbar spine compression fracture, age undetermined.
IMPRESSION: 1.  AICD in stable position.  Stable cardiomegaly.

2. COPD. Diffuse bilateral pulmonary interstitial prominence.
Although a component these changes may be chronic, an active
interstitial process including pneumonitis/interstitial edema can
not be excluded. Tiny bilateral pleural effusions.

3. Diffuse thoracic spine osteopenia degenerative change. Lower
thoracic/upper lumbar spine compression fracture, age undetermined.

## 2020-03-17 IMAGING — DX DG CHEST 2V
2 series · 2 of 2 positions shown · non-contrast
Comparison: 07/30/2018

CLINICAL DATA: Followup pneumonia.

EXAM:
CHEST - 2 VIEW

[chest pa]
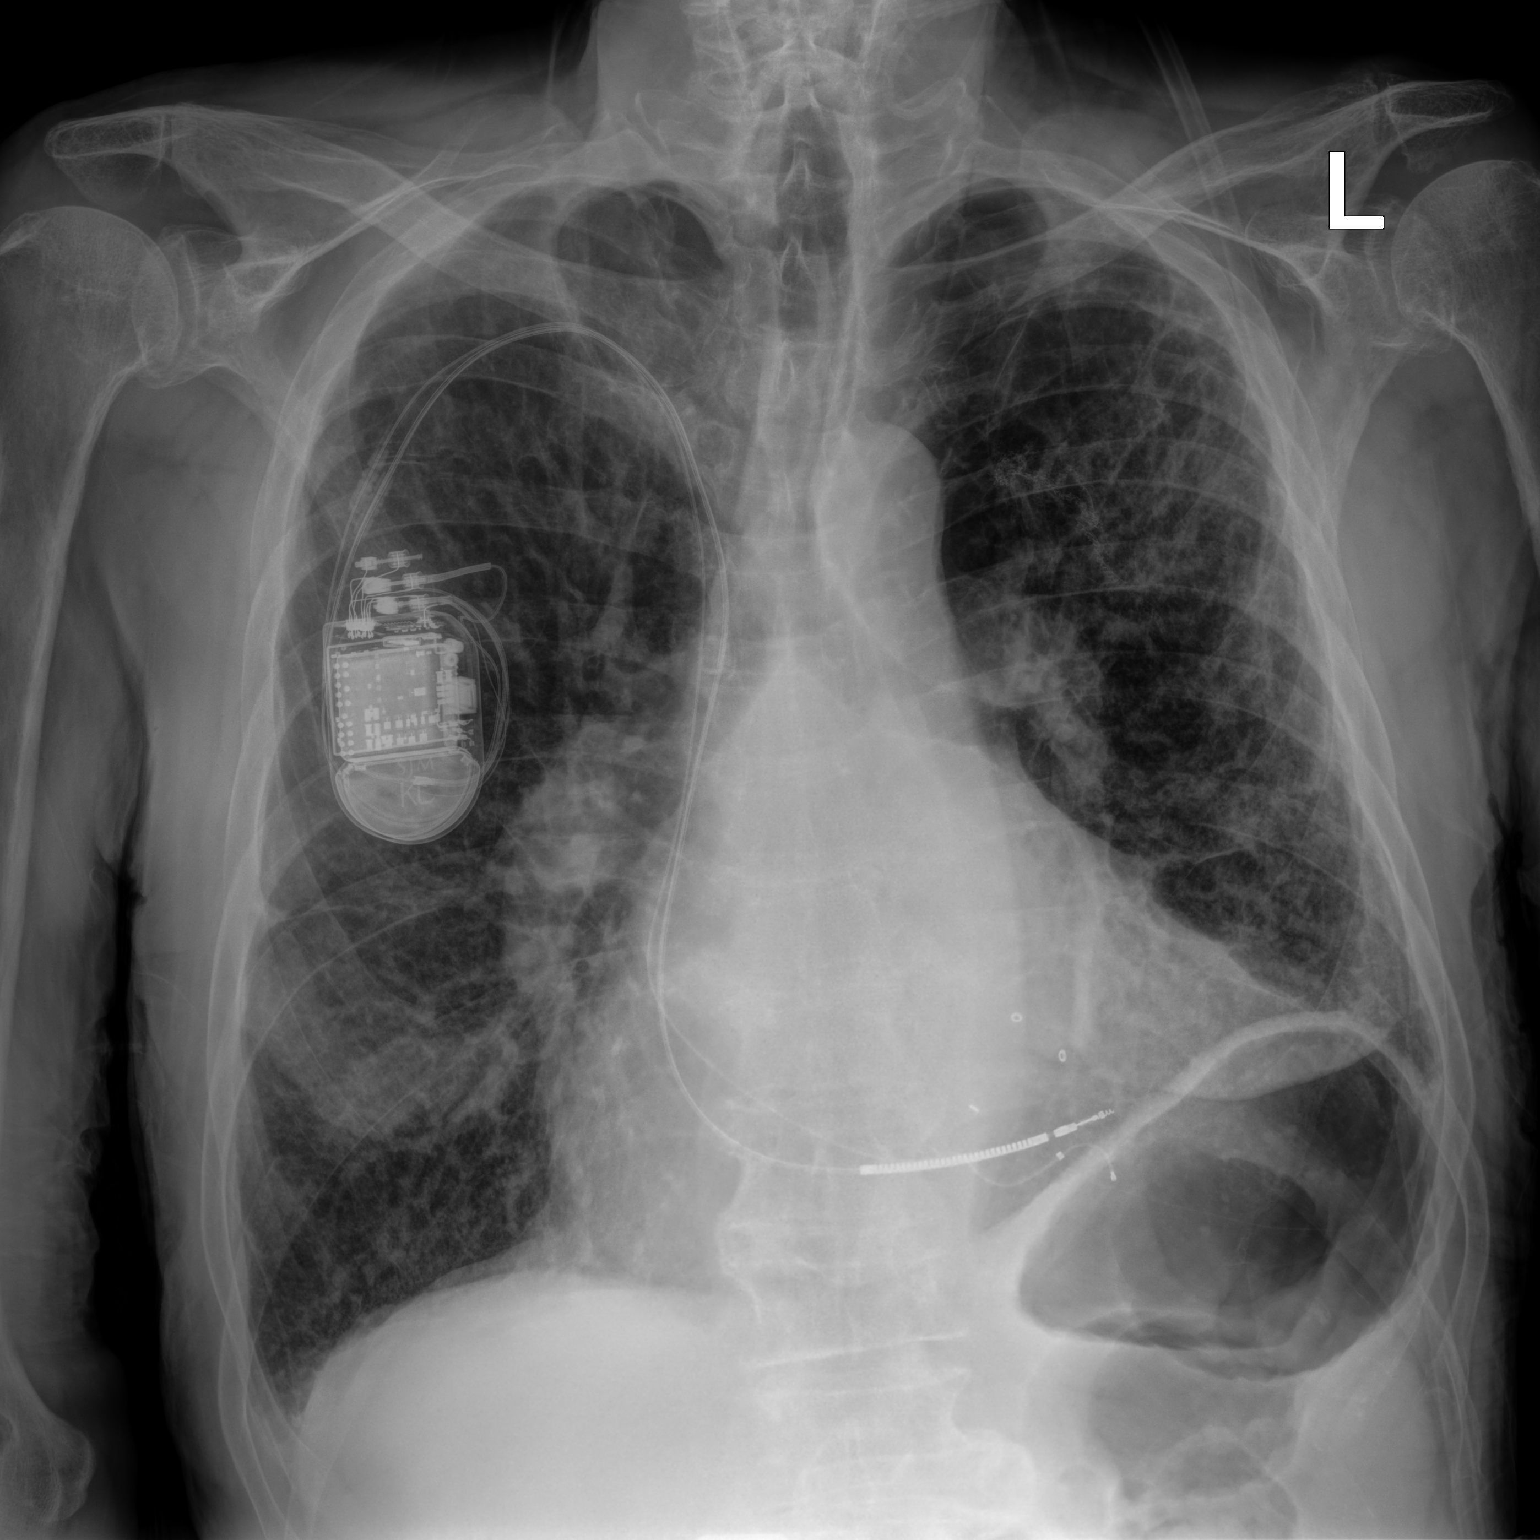

[chest lat]
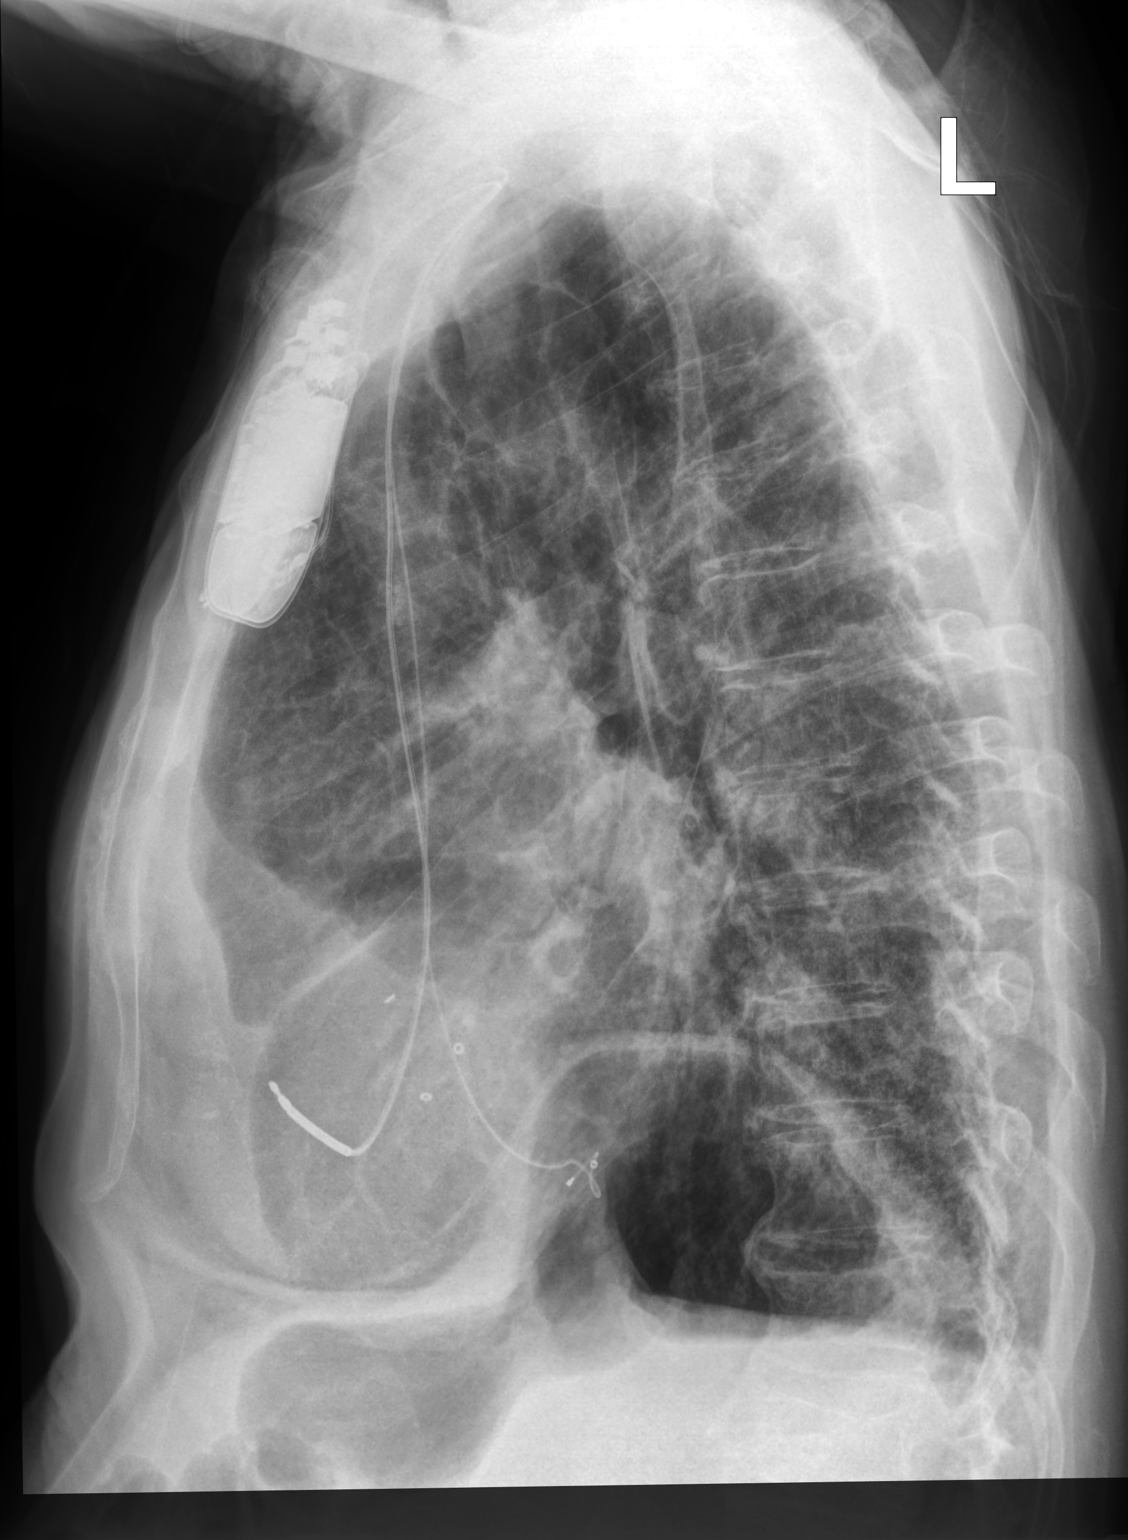

[2 of 2 positions shown; findings below may reference images not displayed]

FINDINGS: Cardiac silhouette is mildly enlarged. No mediastinal or hilar
masses. No evidence of adenopathy.

Previously seen interstitial airspace opacities have resolved. There
are bilaterally thickened bronchovascular and interstitial markings
that are chronic. Emphysematous changes are noted in the upper lobes
mostly at the apices. Lungs are hyperexpanded. Anastomosis staples
in the left upper lobe from previous lung surgery, stable.

No pleural effusion or pneumothorax.

Right anterior chest wall pacemaker is stable.

Skeletal structures demineralized.  No acute skeletal abnormality.
IMPRESSION: 1. Previously seen lung opacities have resolved.
2. There is now no acute cardiopulmonary disease.
3. Cardiomegaly with findings of COPD and chronically thickened
bronchovascular and interstitial markings.

## 2020-03-27 IMAGING — CT CT ANGIO NECK
2 of 13 series · 7 of 46 positions shown, 12 images · IV contrast (OMNI)
Comparison: CT head 09/24/2015

CLINICAL DATA: Acute headache.  Near syncope

EXAM:
CT ANGIOGRAPHY HEAD AND NECK
TECHNIQUE: Multidetector CT imaging of the head and neck was performed using
the standard protocol during bolus administration of intravenous
contrast. Multiplanar CT image reconstructions and MIPs were
obtained to evaluate the vascular anatomy. Carotid stenosis
measurements (when applicable) are obtained utilizing NASCET
criteria, using the distal internal carotid diameter as the
denominator.
CONTRAST:  75 mL Isovue 370 IV

[Series 7: carotid/brain 2.0 i30f 3 · axial · 0.49mm/px · z∈[-279,+1]mm · 5 of 212 slices shown, 10 images]
[im 36/212  soft-tissue]
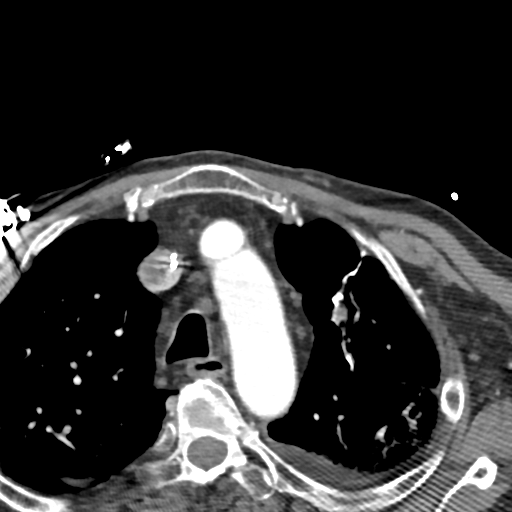
[im 36/212  bone]
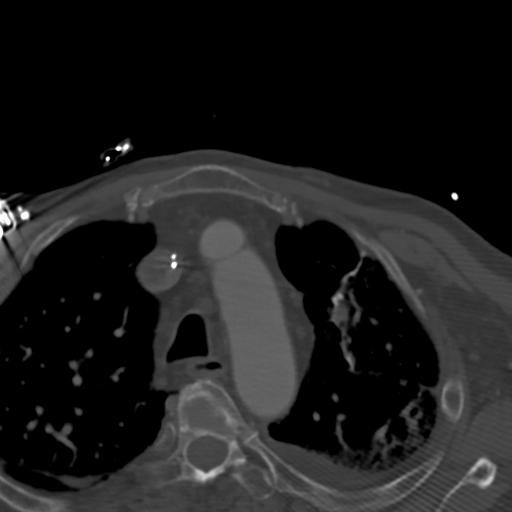
[im 71/212  soft-tissue]
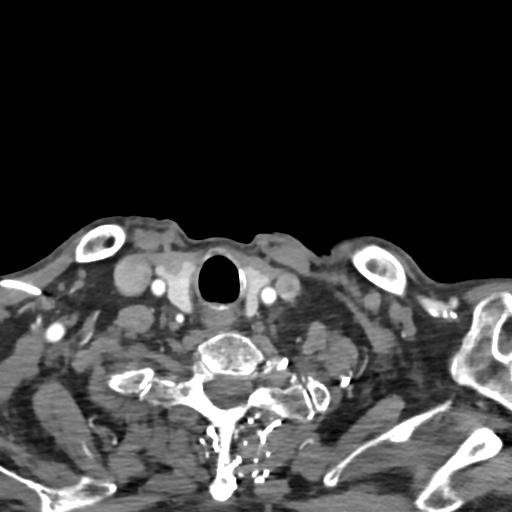
[im 71/212  lung]
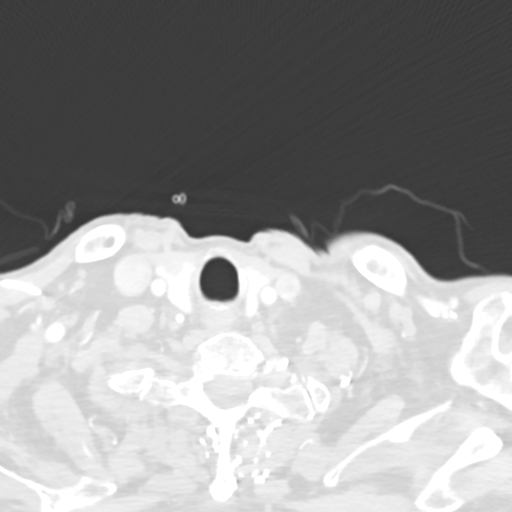
[im 106/212  soft-tissue]
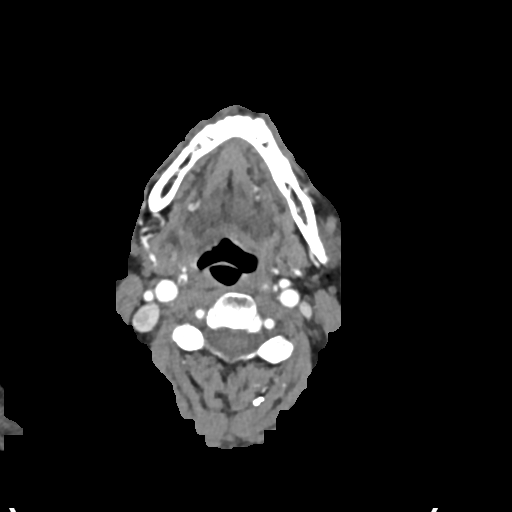
[im 106/212  lung]
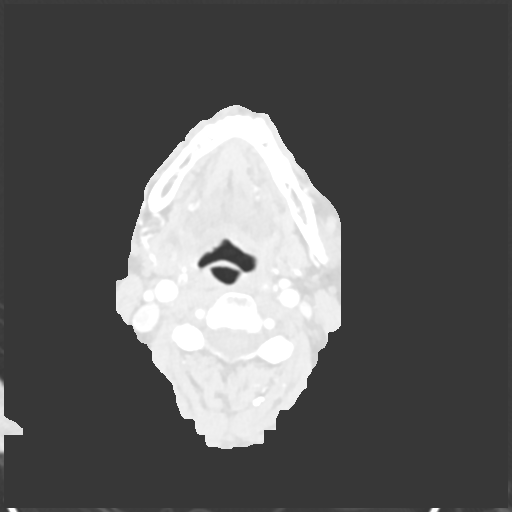
[im 141/212  soft-tissue]
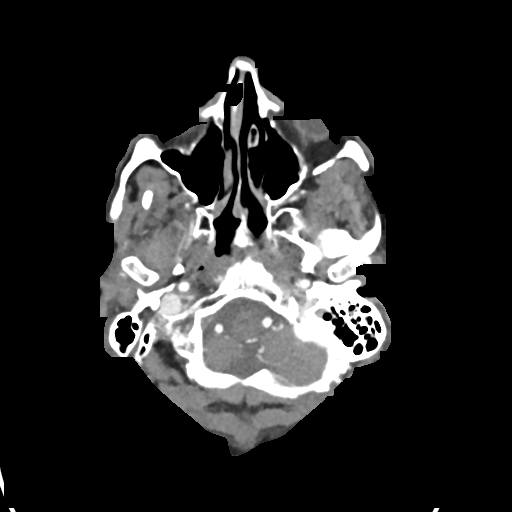
[im 141/212  lung]
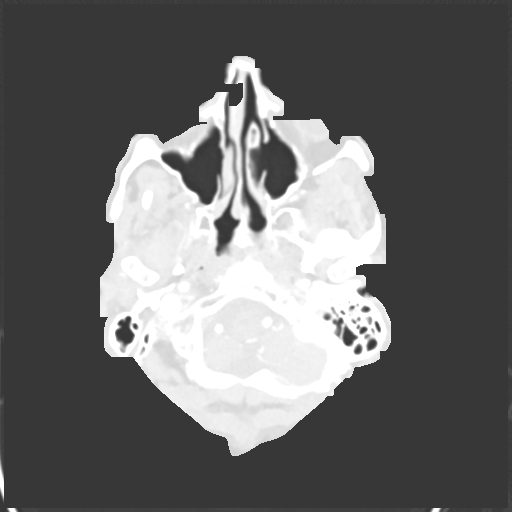
[im 176/212  soft-tissue]
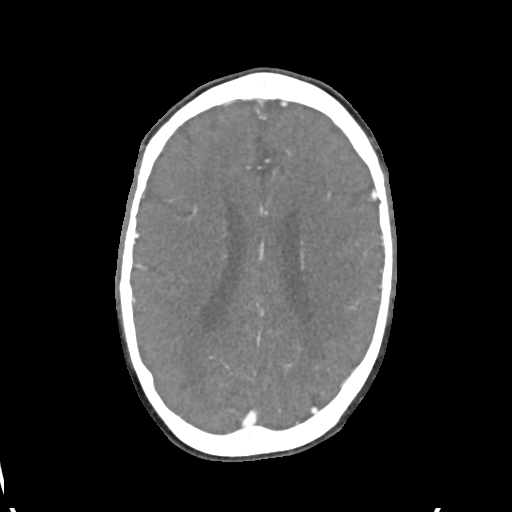
[im 176/212  lung]
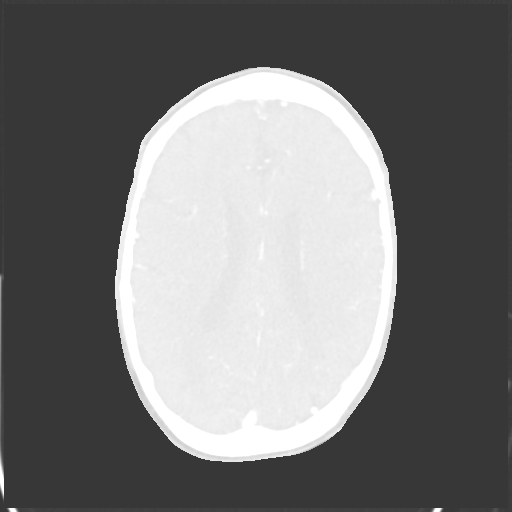

[Series 14: carotid mips (id) · axial · 0.49mm/px · z∈[-206,-56]mm · 2 of 90 slices shown]
[im 30/90  soft-tissue]
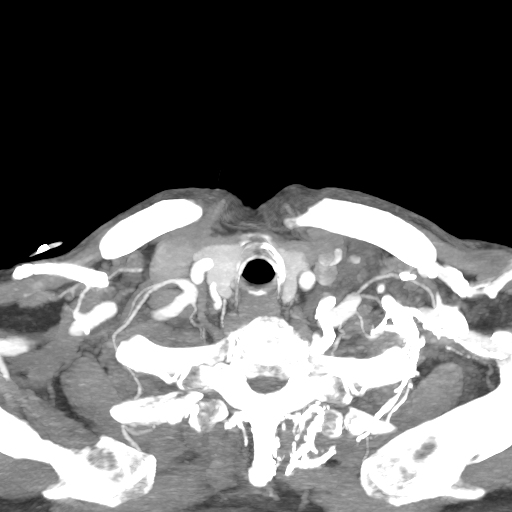
[im 60/90  soft-tissue]
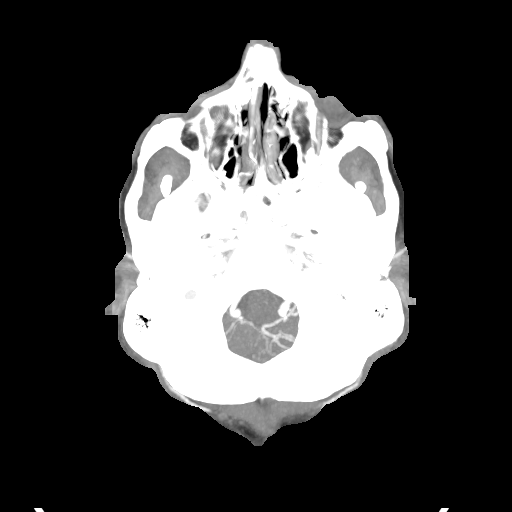

[7 of 46 positions shown; findings below may reference images not displayed]

FINDINGS: CT HEAD FINDINGS

Brain: Generalized atrophy. Chronic microvascular ischemia in the
white matter. Chronic infarcts in the left posterior
temporal/parietal lobe and high right parietal lobe. Negative for
acute infarct, hemorrhage, mass.

Vascular: Atherosclerotic calcification. Negative for hyperdense
vessel

Skull: Negative

Sinuses: Negative

Orbits: Negative for mass.  Bilateral cataract surgery

Review of the MIP images confirms the above findings

CTA NECK FINDINGS

Aortic arch: Mild atherosclerotic disease in the aortic arch. No
aneurysm or dissection.

Right carotid system: Widely patent without stenosis or irregularity

Left carotid system: Widely patent without stenosis. Mild
atherosclerotic calcification left carotid bulb.

Vertebral arteries: Both vertebral arteries patent to the basilar
without stenosis. Left vertebral artery origin from the aortic arch.

Skeleton: No acute abnormality.

Other neck: 12 mm right thyroid nodule. No adenopathy or mass in the
neck.

Upper chest: Apical emphysema and scarring bilaterally. Small left
effusion and left upper lobe infiltrate or atelectasis posteriorly.

Review of the MIP images confirms the above findings

CTA HEAD FINDINGS

Anterior circulation: Atherosclerotic calcification in the cavernous
carotid bilaterally with mild stenosis. Anterior and middle cerebral
arteries patent bilaterally without significant stenosis or large
vessel occlusion.

Posterior circulation: Both vertebral arteries patent to the
basilar. PICA patent bilaterally. Basilar widely patent. Superior
cerebellar and posterior cerebral arteries patent bilaterally
without stenosis.

Venous sinuses: Patent

Anatomic variants: Negative for cerebral aneurysm.

Delayed phase: Normal enhancement on delayed imaging

Review of the MIP images confirms the above findings
IMPRESSION: 1. Atrophy and chronic ischemic change.  No acute abnormality
2. No significant carotid or vertebral artery stenosis in the neck
3. Negative for emergent large vessel occlusion. No significant
intracranial stenosis.

## 2020-05-06 ENCOUNTER — Other Ambulatory Visit: Payer: Self-pay

## 2020-07-21 IMAGING — CT CT CHEST WITH CONTRAST
2 of 3 series · 15 of 36 positions shown, 18 images · IV contrast (OMNIPAQUE)
Comparison: 12/22/2017

CLINICAL DATA: Follow-up lung cancer

EXAM:
CT CHEST WITH CONTRAST
TECHNIQUE: Multidetector CT imaging of the chest was performed during
intravenous contrast administration.
CONTRAST:  75mL OMNIPAQUE IOHEXOL 300 MG/ML  SOLN

[Series 2: axial st · axial · 0.75mm/px · z∈[+1220,+1508]mm · 12 of 170 slices shown, 15 images]
[im 13/170  mediastinal]
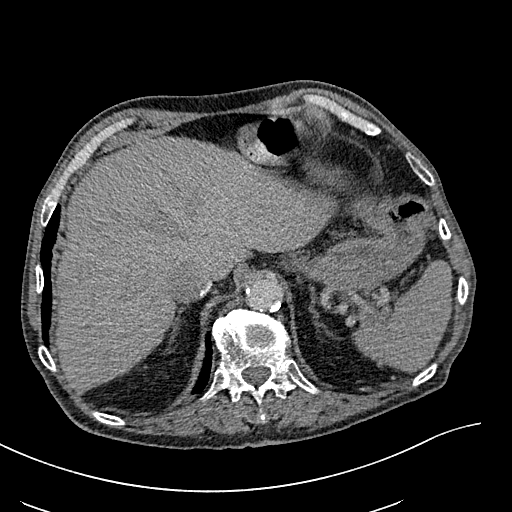
[im 13/170  lung]
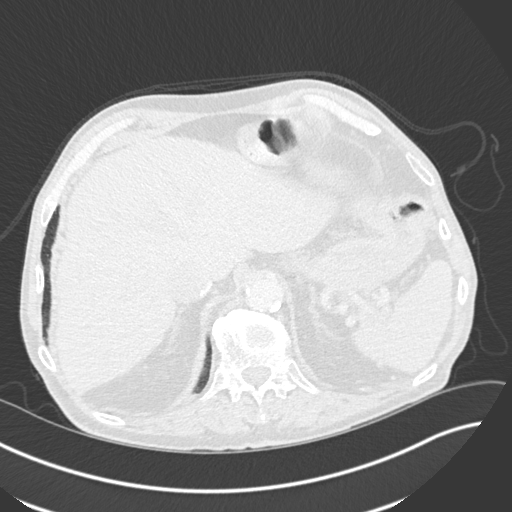
[im 26/170  lung]
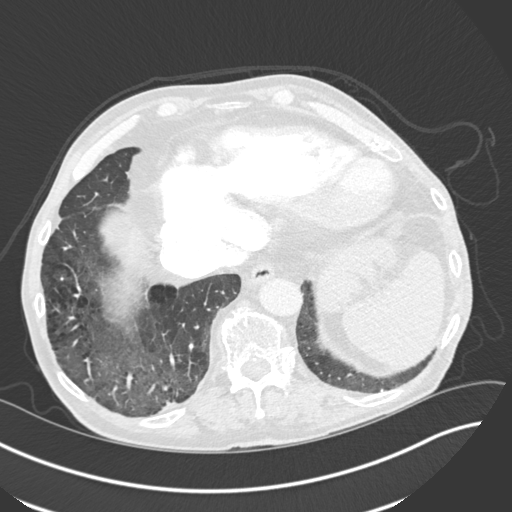
[im 38/170  lung]
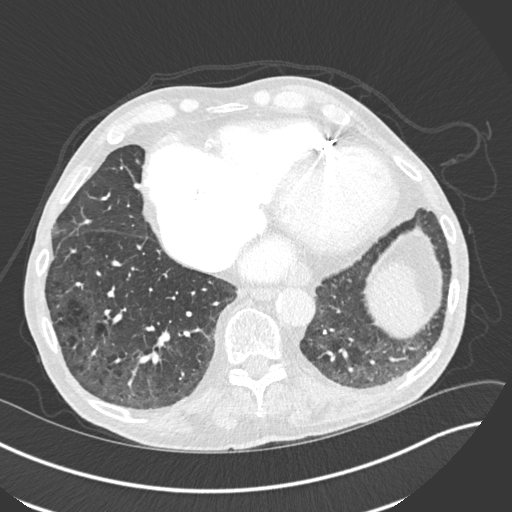
[im 51/170  lung]
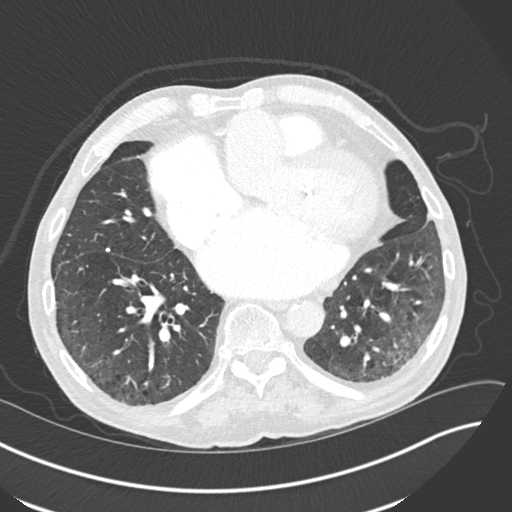
[im 63/170  mediastinal]
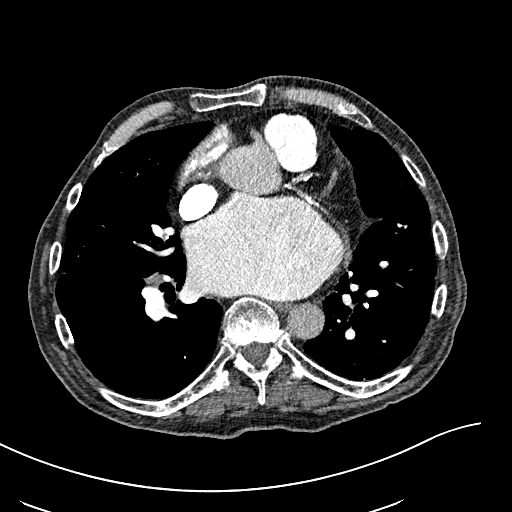
[im 63/170  lung]
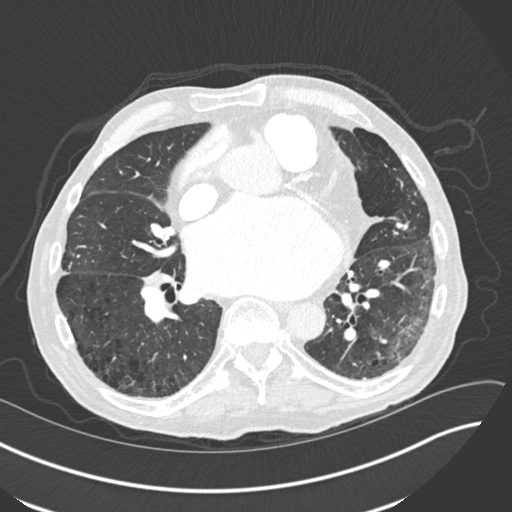
[im 76/170  lung]
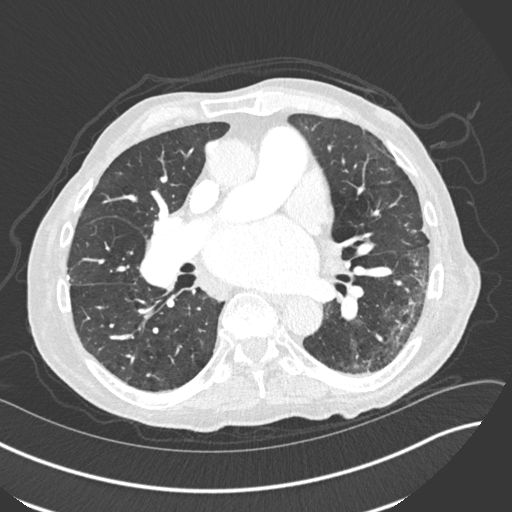
[im 94/170  lung]
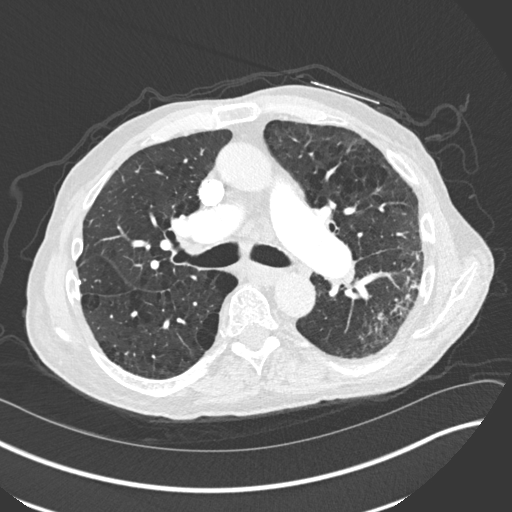
[im 107/170  lung]
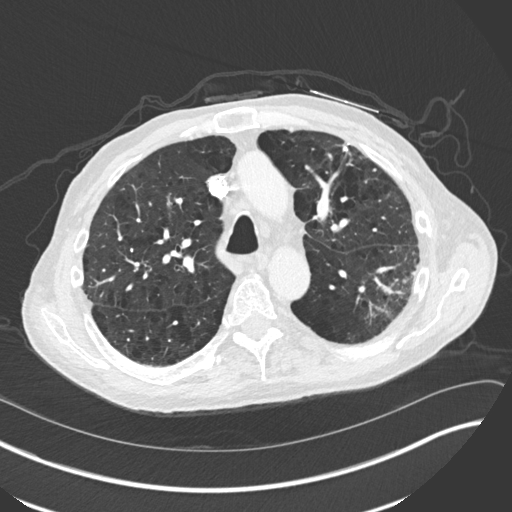
[im 119/170  mediastinal]
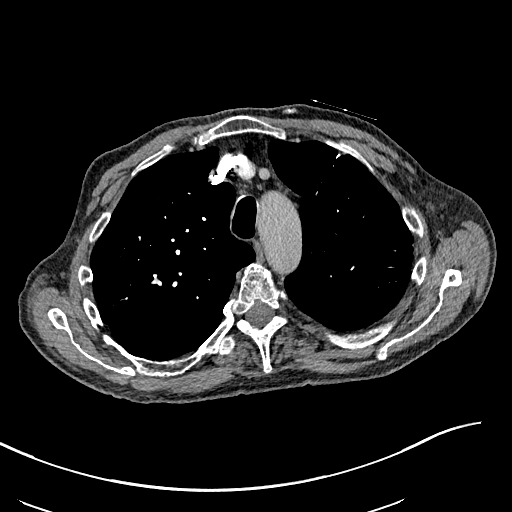
[im 119/170  lung]
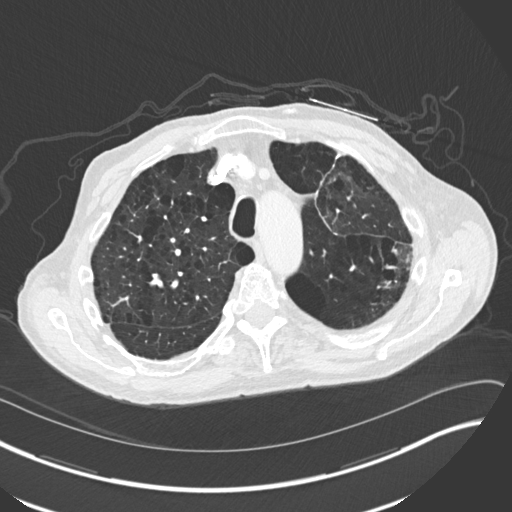
[im 132/170  lung]
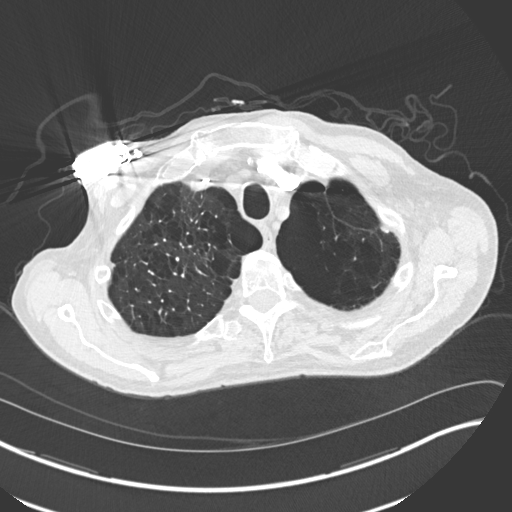
[im 144/170  lung]
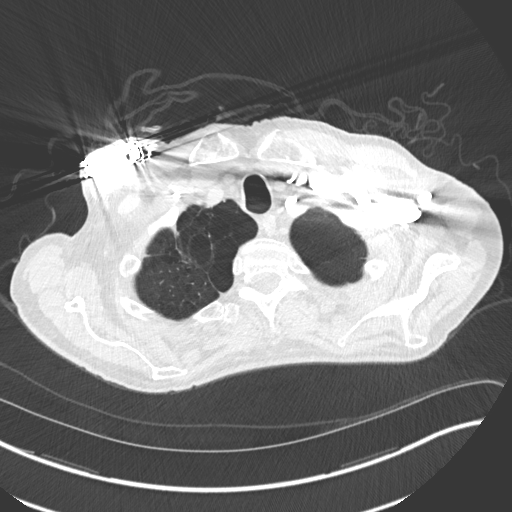
[im 157/170  lung]
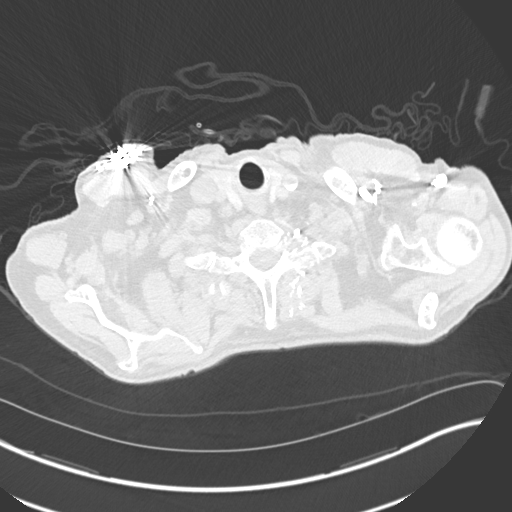

[Series 6: coronal · coronal · 0.63mm/px · 3 of 134 slices shown]
[im 27/134  lung]
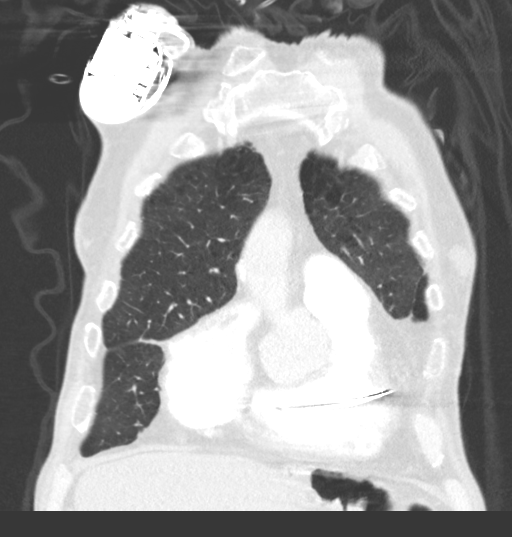
[im 54/134  lung]
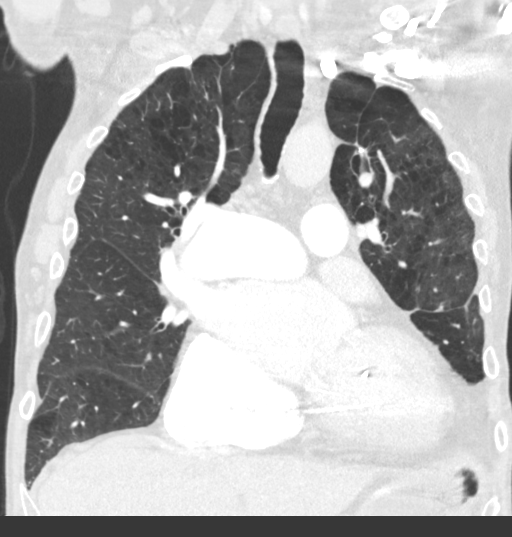
[im 80/134  lung]
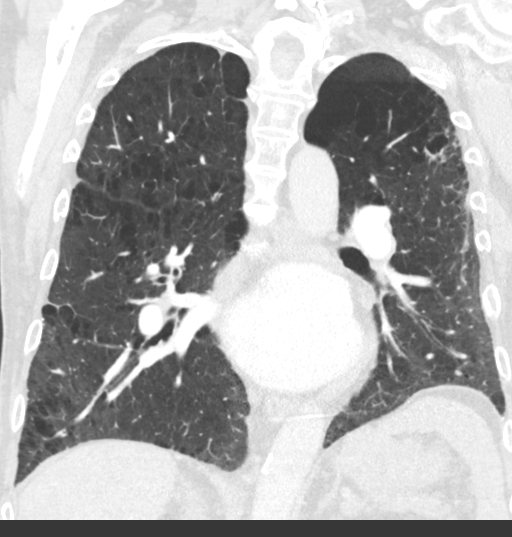

[15 of 36 positions shown; findings below may reference images not displayed]

FINDINGS: Cardiovascular: Cardiomegaly with left atrial enlargement.

No evidence of thoracic aortic aneurysm. Mild atherosclerotic
calcifications of the aortic arch.

Three vessel coronary atherosclerosis.

Mediastinum/Nodes: Small mediastinal nodes, including a 14 mm short
axis AP window node, previously 12 mm. Dominant 19 mm short axis
subcarinal node (series 2/image 81), previously 16 mm. 13 mm short
axis right hilar node (series 2/image 83), unchanged.

Visualized thyroid is unremarkable.

Lungs/Pleura: Status post left upper lobe wedge resection.

Moderate centrilobular and paraseptal emphysematous changes, upper
lung predominant.

3 mm subpleural left upper lobe nodule (series 5/image 64),
unchanged. No new/suspicious pulmonary nodules.

Scattered calcified granulomata, left lower lobe predominant,
benign.

Mild dependent atelectasis in the bilateral lower lobes. No focal
consolidation.

Trace pleural fluid in the right lung base.  No pneumothorax.

Upper Abdomen: Visualized upper abdomen is grossly unremarkable,
noting vascular calcifications.

Musculoskeletal: Mild degenerative changes of the visualized
thoracolumbar spine.
IMPRESSION: Status post left upper lobe wedge resection.

Mild thoracic lymphadenopathy, including a 19 mm short axis
subcarinal node, mildly progressive. Nodal metastases are not
excluded. Continued attention on follow-up is suggested.

Otherwise, no evidence of metastatic disease.

Aortic Atherosclerosis (5ZBLU-G2Q.Q) and Emphysema (5ZBLU-21I.G).

## 2020-07-25 IMAGING — CT CT HEAR MORPH WITH CTA COR WITH SCORE WITH CA WITH CONTRAST AND
4 of 7 series · 8 of 20 positions shown, 9 images · IV contrast (APPLIED)
Comparison: 12/21/2018

Addendum:
CLINICAL DATA: Sinus of Valsalva Aneurysm

EXAM:
Cardiac CTA
MEDICATIONS:
Sub lingual nitro. 4mg and lopressor 5mg
TECHNIQUE: The patient was scanned on a Siemens Force [REDACTED]ice scanner. Gantry
rotation speed was 250 msecs. Collimation was .6 mm. A 100 kV
prospective scan was triggered in the ascending thoracic aorta at
140 HU's Full mA was used between 35% and 75% of the R-R interval.
Average HR during the scan was 74 bpm. The 3D data set was
interpreted on a dedicated work station using MPR, MIP and VRT
modes. A total of 80 cc of contrast was used.

[Series 6: best diast 76 % · axial · 0.42mm/px · z∈[+1195,+1271]mm · 2 of 573 slices shown, 3 images]
[im 191/573  vessel]
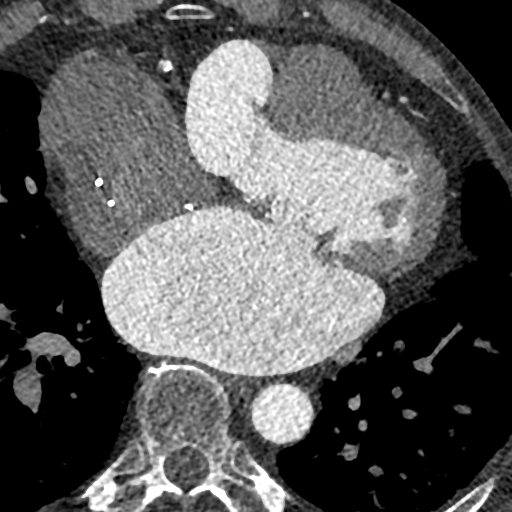
[im 191/573  lung]
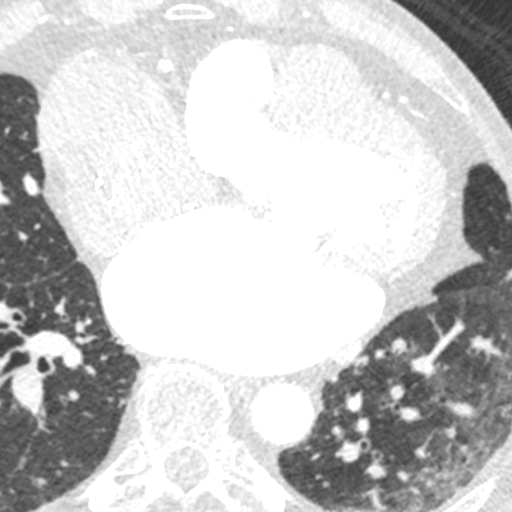
[im 382/573  vessel]
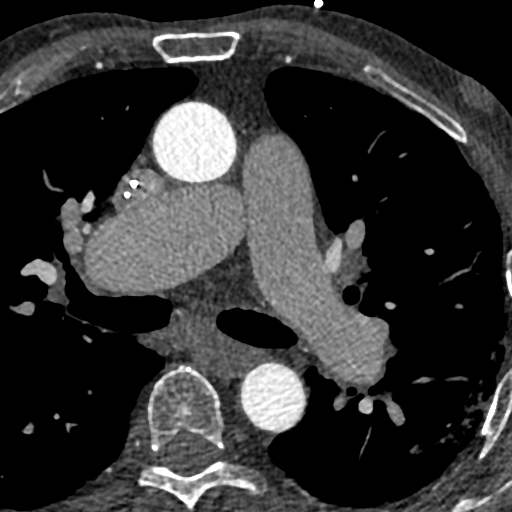

[Series 7: best syst 34 % · axial · 0.42mm/px · z∈[+1195,+1271]mm · 2 of 573 slices shown]
[im 191/573  vessel]
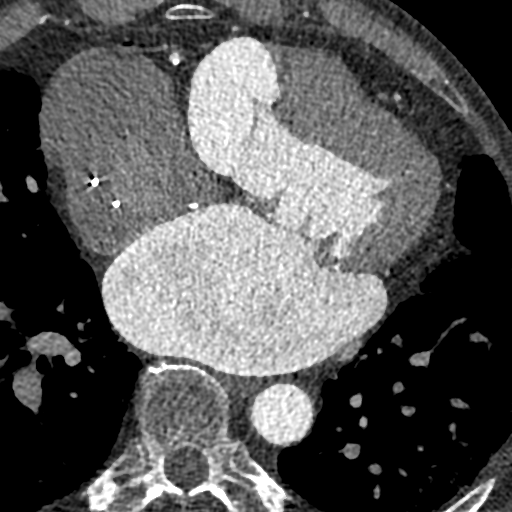
[im 382/573  vessel]
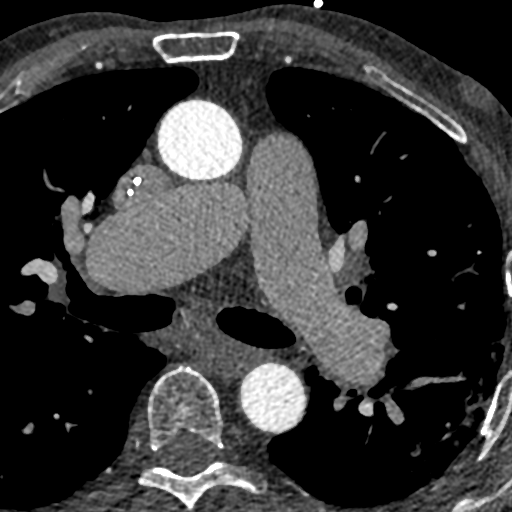

[Series 8: ts diast sharp 76 % · axial · 0.42mm/px · z∈[+1195,+1271]mm · 2 of 573 slices shown]
[im 191/573  lung]
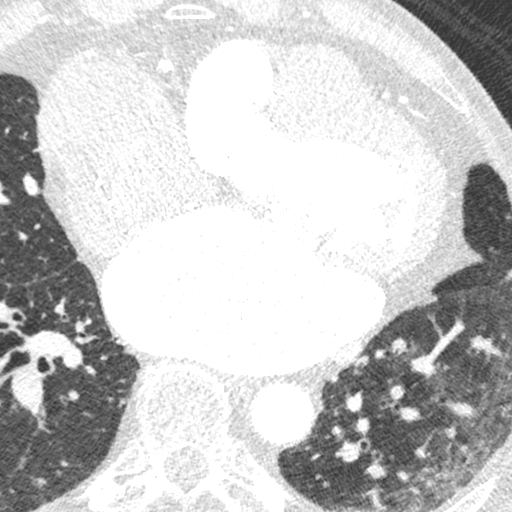
[im 382/573  lung]
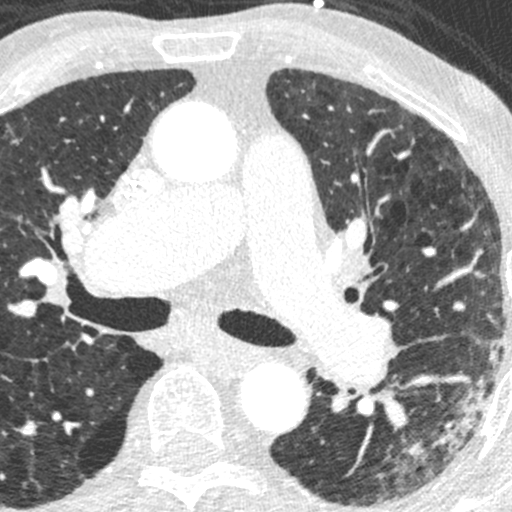

[Series 9: ts syst sharp 34 % · axial · 0.42mm/px · z∈[+1195,+1271]mm · 2 of 573 slices shown]
[im 191/573  lung]
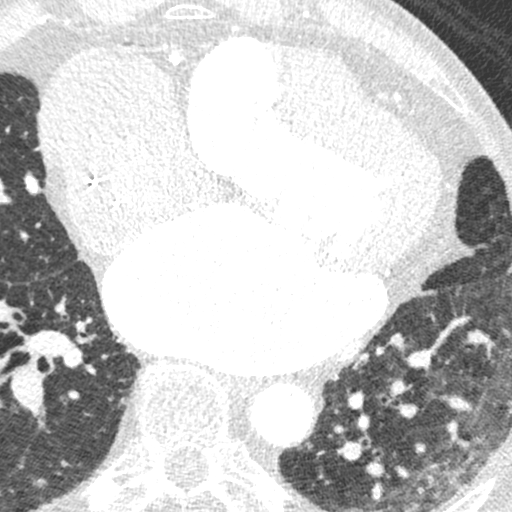
[im 382/573  lung]
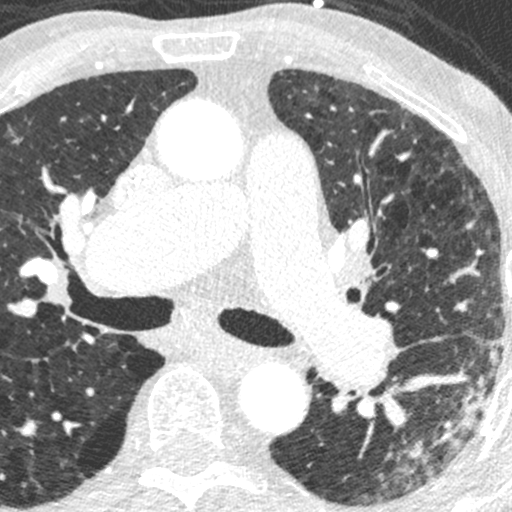

[8 of 20 positions shown; findings below may reference images not displayed]

FINDINGS: Aorta: Normal aortic root with bovine arch.

STJ: 33 mm

Aortic Root: 34 mm

Arch: 29 mm

Descending Thoracic aorta: 25 mm

There is a large right sinus of Valsalva aneurysm with impingement
of the RVOT. The RCA ostium is above the aneurysm and displaced
cephalad

Right Sinus: 61 mm

Left Sinus: 45 mm

Non Sinus: 49 mm

The patient is s/o MVR with 33 mm bioprosthetic Mosaic [REDACTED]
valve. Sewing ring intact

The LV apex is thinned and aneurysmal with no mural thrombus

Pacing wires are seen in the RA/RV

There is severe biatrial enlargement with no CIDUGULLCITY thrombus
IMPRESSION: 1. Large right sinus of Valsalva aneurysm measuring 61 mm and
impinging on RVOT. RCA emanates above aneurysm and is displaced
cephaled

2.  Severe bi atrial enlargement with no CIDUGULLCITY thrombus

3.  MVR [REDACTED] 33 mm Mosaic bioprosthesis with intact sewing ring

4.  Thinned and aneurysmal LV apex no mural thrombus

5.  Pacing wires in RA/RV

6.  Normal aortic root with bovine arch no dissection noted

Findings are similar to CT done on 10/17/17

Giorgi Jumper

EXAM:
OVER-READ INTERPRETATION  CT CHEST

The following report is an over-read performed by radiologist Dr.
over-read does not include interpretation of cardiac or coronary
anatomy or pathology. The coronary CTA interpretation by the
cardiologist is attached.
FINDINGS: Cardiac enlargement. The main pulmonary artery measures 3.6 cm.
Aortic atherosclerosis noted.

Visualized portions of the trachea are unremarkable. The a esophagus
is unremarkable. Left paratracheal lymph node measures 1.4 cm, image
[DATE]. Unchanged. Subcarinal lymph node measures 1.8 cm, image
[DATE]. Also unchanged. No hilar adenopathy.

Previous left upper lobe wedge resection. Trace left pleural
effusion appears new. There are advanced changes of emphysema
identified. Left upper lobe scarring. Dependent changes noted within
the periphery of the left lower lobe. Scattered calcified
granulomas.

No acute abnormality within the visualized portions of the upper
abdomen.

Visualized osseous structures are unremarkable.
IMPRESSION: 1. Stable mild subcarinal and left paratracheal adenopathy.
2. Aortic Atherosclerosis (QOR38-QKK.K) and Emphysema ([id number].Z).
3. New small left pleural effusion

*** End of Addendum ***
FINDINGS: Aorta: Normal aortic root with bovine arch.

STJ: 33 mm

Aortic Root: 34 mm

Arch: 29 mm

Descending Thoracic aorta: 25 mm

There is a large right sinus of Valsalva aneurysm with impingement
of the RVOT. The RCA ostium is above the aneurysm and displaced
cephalad

Right Sinus: 61 mm

Left Sinus: 45 mm

Non Sinus: 49 mm

The patient is s/o MVR with 33 mm bioprosthetic Mosaic [REDACTED]
valve. Sewing ring intact

The LV apex is thinned and aneurysmal with no mural thrombus

Pacing wires are seen in the RA/RV

There is severe biatrial enlargement with no CIDUGULLCITY thrombus
IMPRESSION: 1. Large right sinus of Valsalva aneurysm measuring 61 mm and
impinging on RVOT. RCA emanates above aneurysm and is displaced
cephaled

2.  Severe bi atrial enlargement with no CIDUGULLCITY thrombus

3.  MVR [REDACTED] 33 mm Mosaic bioprosthesis with intact sewing ring

4.  Thinned and aneurysmal LV apex no mural thrombus

5.  Pacing wires in RA/RV

6.  Normal aortic root with bovine arch no dissection noted

Findings are similar to CT done on 10/17/17

Giorgi Jumper

## 2020-08-12 ENCOUNTER — Ambulatory Visit: Payer: Self-pay

## 2020-10-23 IMAGING — CT CT CHEST W/ CM
2 of 3 series · 15 of 36 positions shown, 18 images · IV contrast (omnipaque)
Comparison: 12/21/2018, 12/22/2017

CLINICAL DATA: Lung cancer staging, stage IA left upper lobe status
post wedge resection

EXAM:
CT CHEST WITH CONTRAST
TECHNIQUE: Multidetector CT imaging of the chest was performed during
intravenous contrast administration.
CONTRAST:  75mL OMNIPAQUE IOHEXOL 300 MG/ML  SOLN

[Series 2: axial st · axial · 0.78mm/px · z∈[+1352,+1652]mm · 12 of 176 slices shown, 15 images]
[im 13/176  mediastinal]
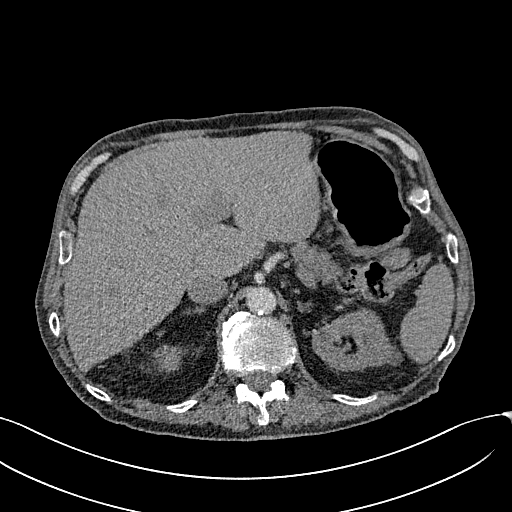
[im 13/176  lung]
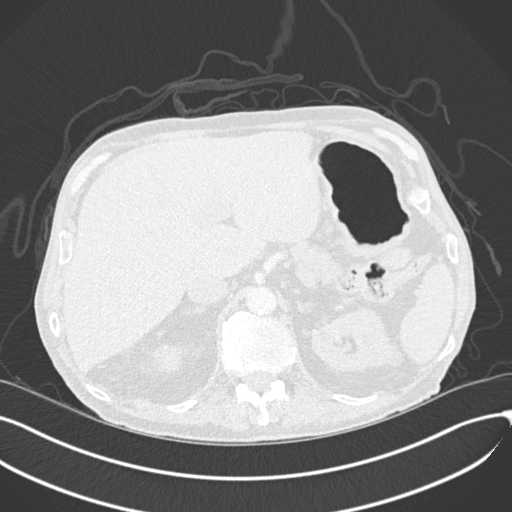
[im 26/176  lung]
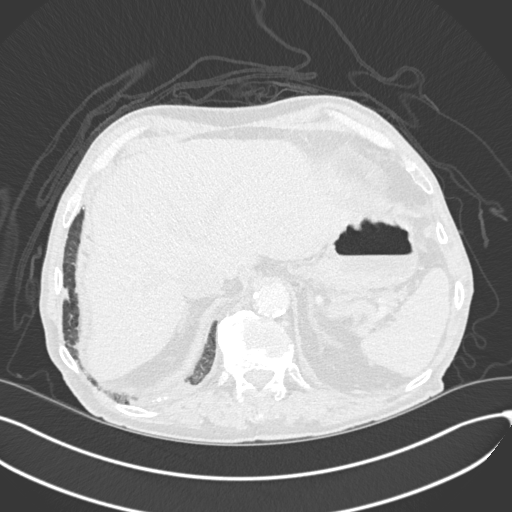
[im 39/176  lung]
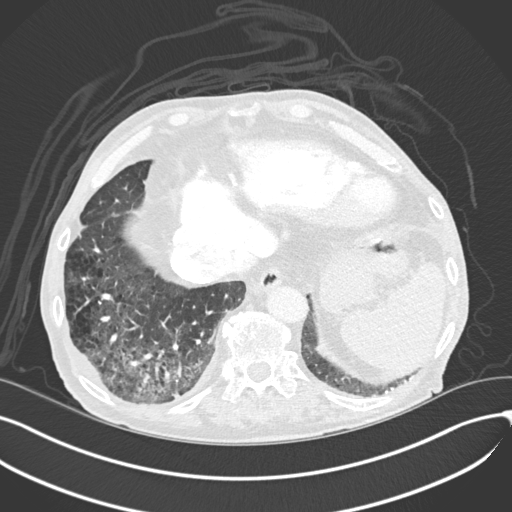
[im 52/176  lung]
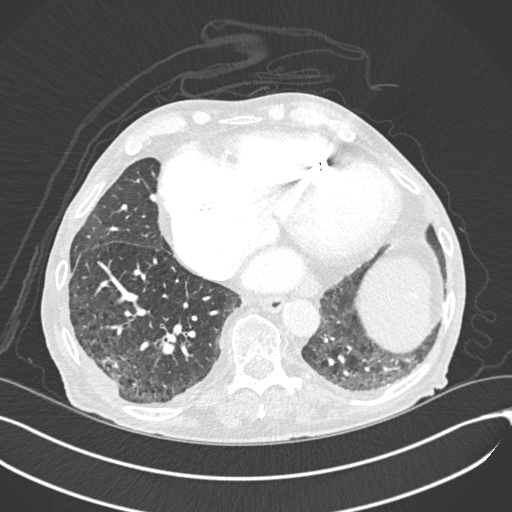
[im 65/176  mediastinal]
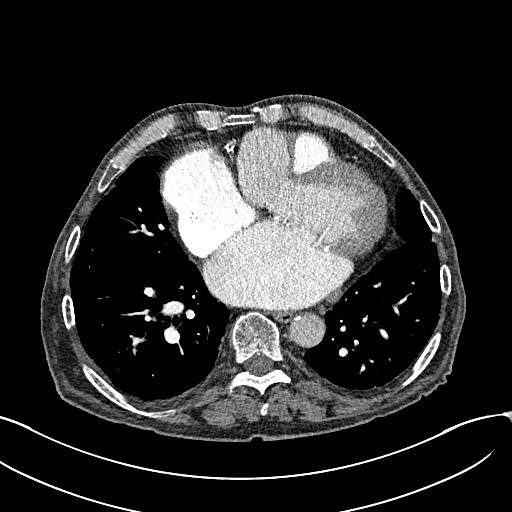
[im 65/176  lung]
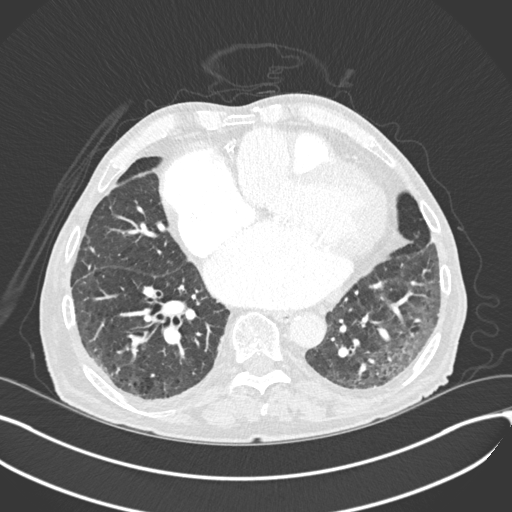
[im 78/176  lung]
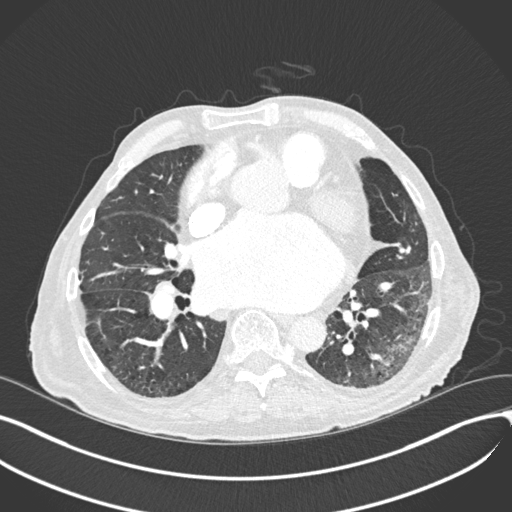
[im 98/176  lung]
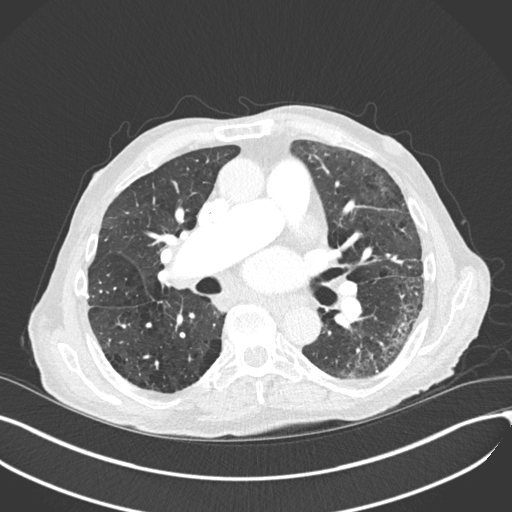
[im 111/176  lung]
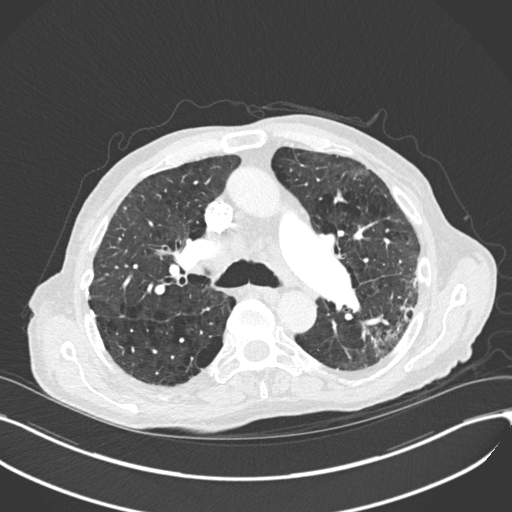
[im 124/176  mediastinal]
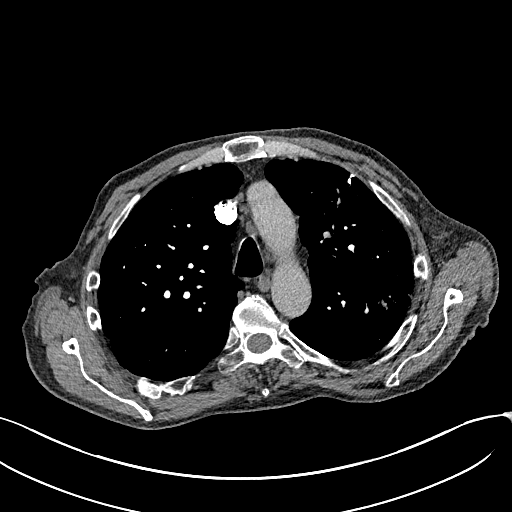
[im 124/176  lung]
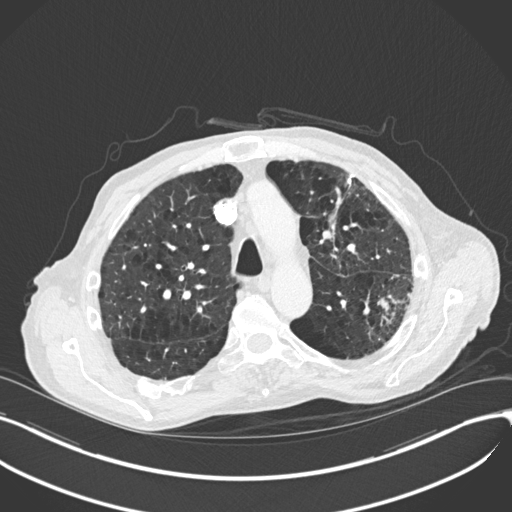
[im 137/176  lung]
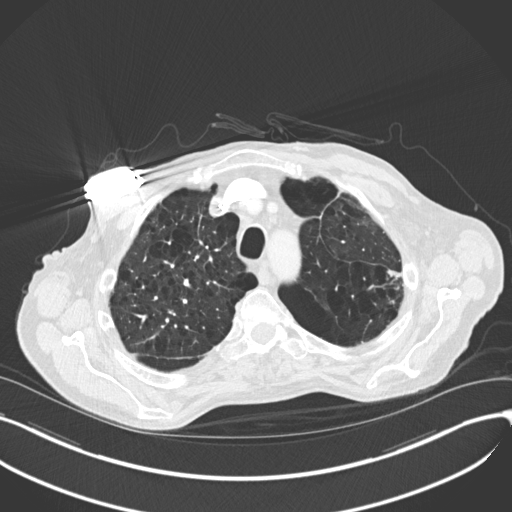
[im 150/176  lung]
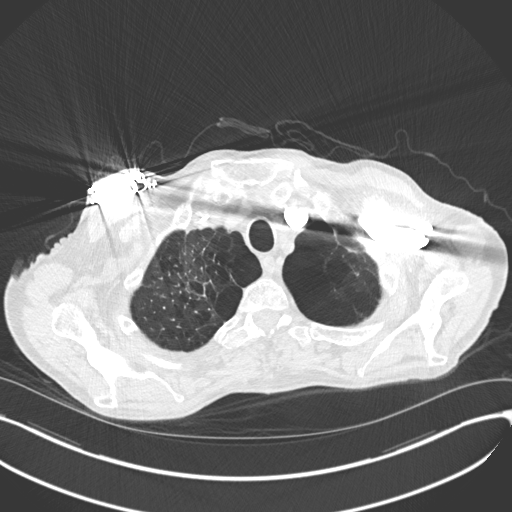
[im 163/176  lung]
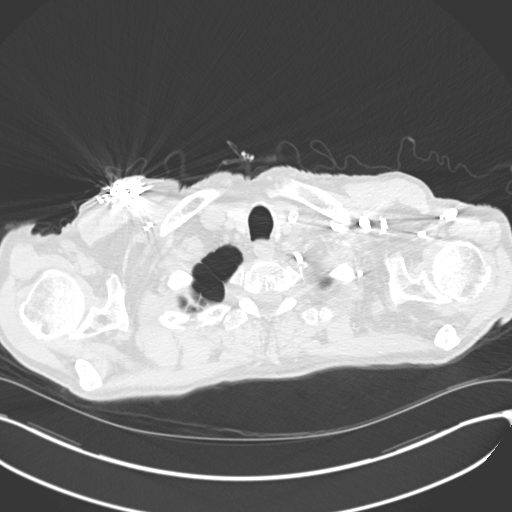

[Series 6: coronal · coronal · 0.72mm/px · 3 of 129 slices shown]
[im 26/129  lung]
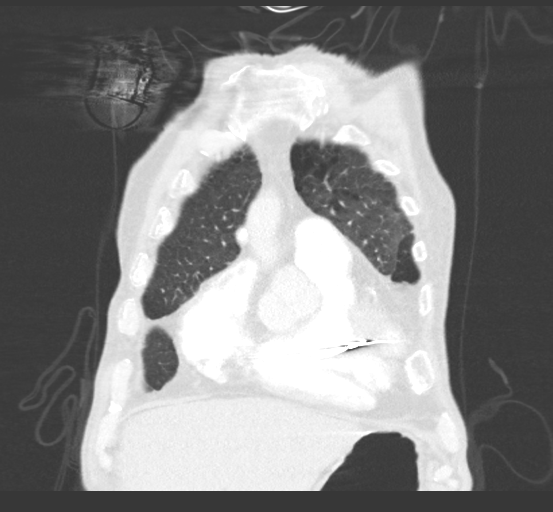
[im 52/129  lung]
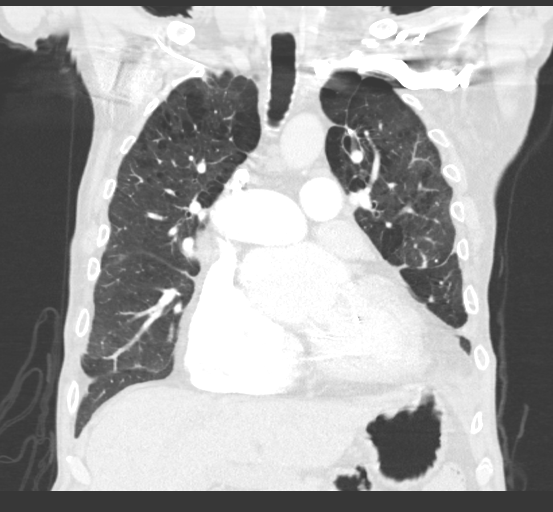
[im 77/129  lung]
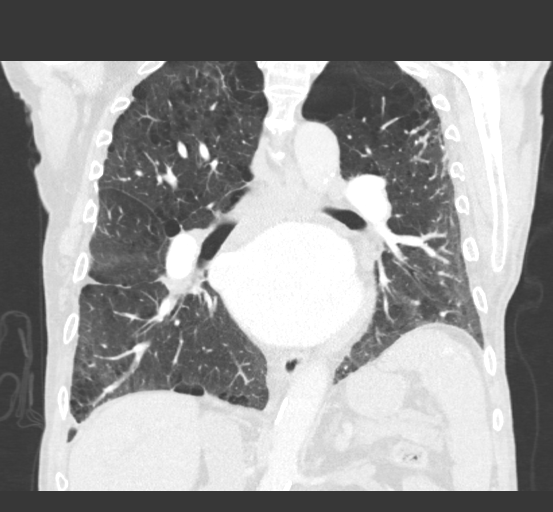

[15 of 36 positions shown; findings below may reference images not displayed]

FINDINGS: Cardiovascular: Scattered aortic atherosclerosis. Gross cardiomegaly
with severe enlargement of the atria. Three-vessel coronary artery
calcifications. Right chest multi lead pacer defibrillator. No
pericardial effusion. Enlargement of the pulmonary arteries to
cm.

Mediastinum/Nodes: Redemonstrated enlargement of numerous
mediastinal and hilar lymph nodes, the largest left hilar node
measuring 1.5 cm in short axis (series 2, image 62) and subcarinal
nodes measuring 2.0 cm in short axis (series 2, image 25). Thyroid
gland, trachea, and esophagus demonstrate no significant findings.

Lungs/Pleura: Severe emphysema. Status post left upper lobe wedge
resection. Dependent bibasilar scarring with numerous tiny benign
calcified nodules of the dependent lungs. Very small, loculated
right pleural effusion, new compared to prior examination.

Upper Abdomen: No acute abnormality.

Musculoskeletal: No chest wall mass or suspicious bone lesions
identified. Unchanged superior endplate deformity of T12.
IMPRESSION: 1. Redemonstrated postoperative findings of left upper lobe wedge
resection. No evidence of local malignant recurrence.

2. Redemonstrated enlargement of numerous mediastinal and hilar
lymph nodes, the largest left hilar node measuring 1.5 cm in short
axis (series 2, image 62) and subcarinal nodes measuring 2.0 cm in
short axis (series 2, image 25). These are not significantly changed
compared to recent CT dated 12/21/2018 although are slightly
enlarged in comparison to examinations dating back to 12/22/2017.

3. Very small, loculated right pleural effusion, new compared to
prior examination.

4.  Cardiomegaly and coronary artery disease.

5. Aortic Atherosclerosis (AE6VW-RTH.H) and Emphysema (AE6VW-7ND.8).

## 2020-11-25 IMAGING — DX DG CHEST 1V PORT
2 series · 2 of 2 positions shown · non-contrast
Comparison: Chest x-ray 08/17/2018.

CLINICAL DATA: 72-year-old male with history of chest pain.
Dyspnea.

EXAM:
PORTABLE CHEST 1 VIEW

[chest ap (1 of 2)]
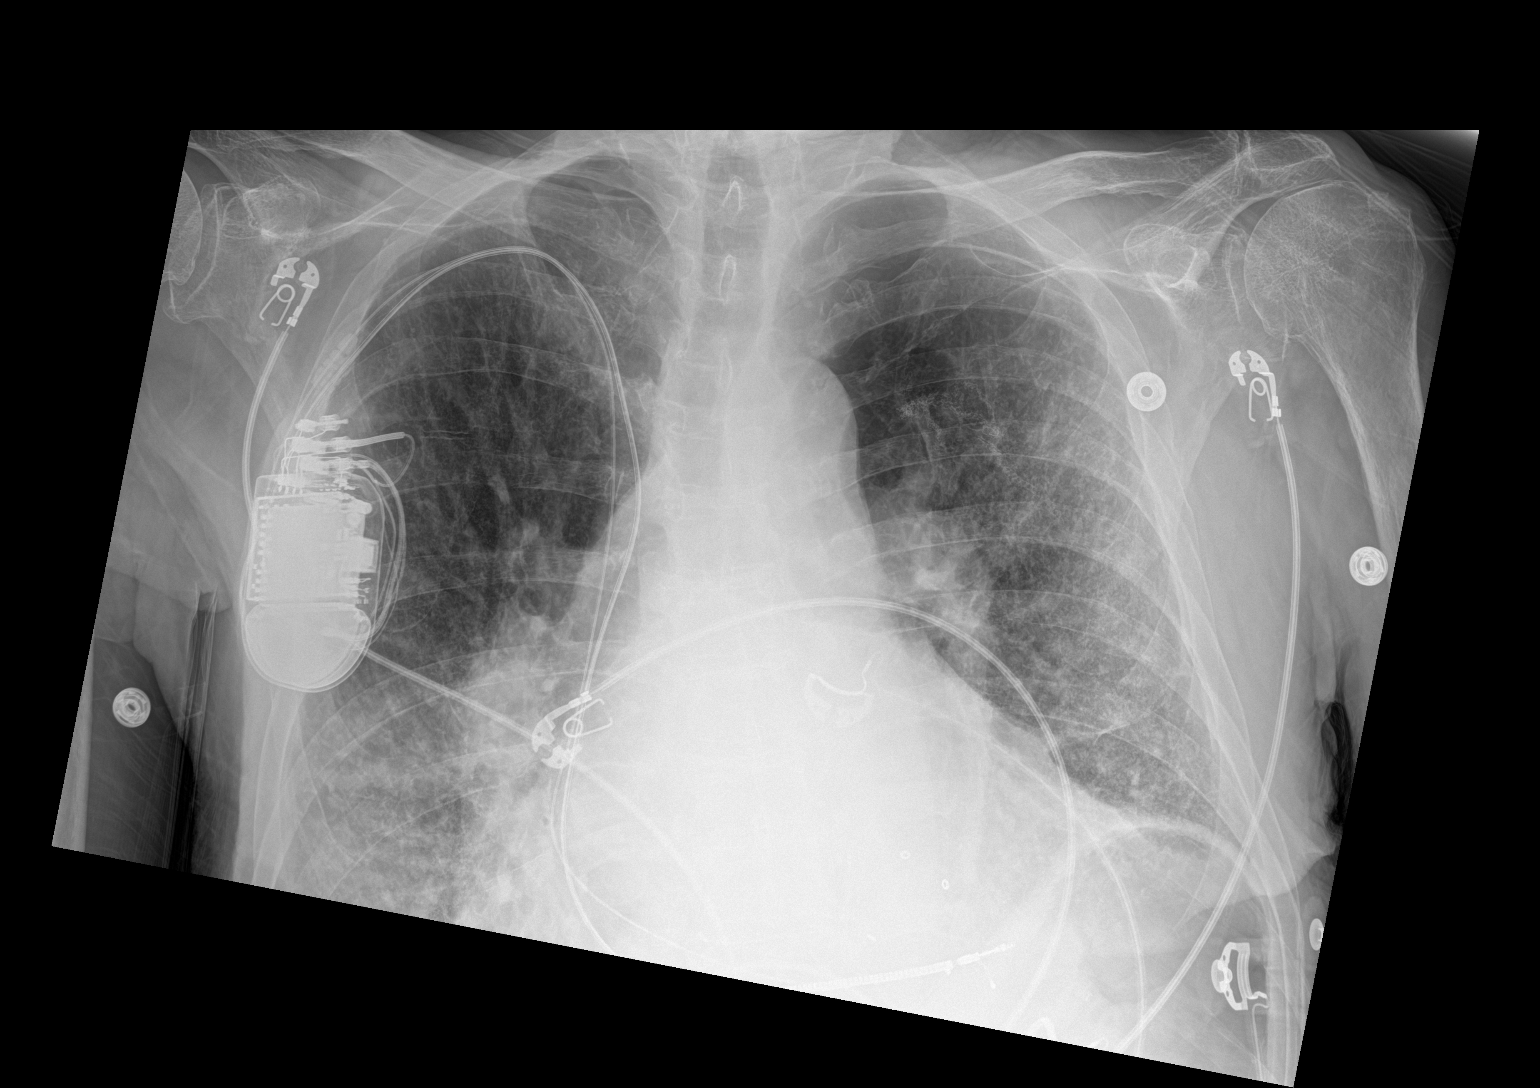

[chest ap (2 of 2)]
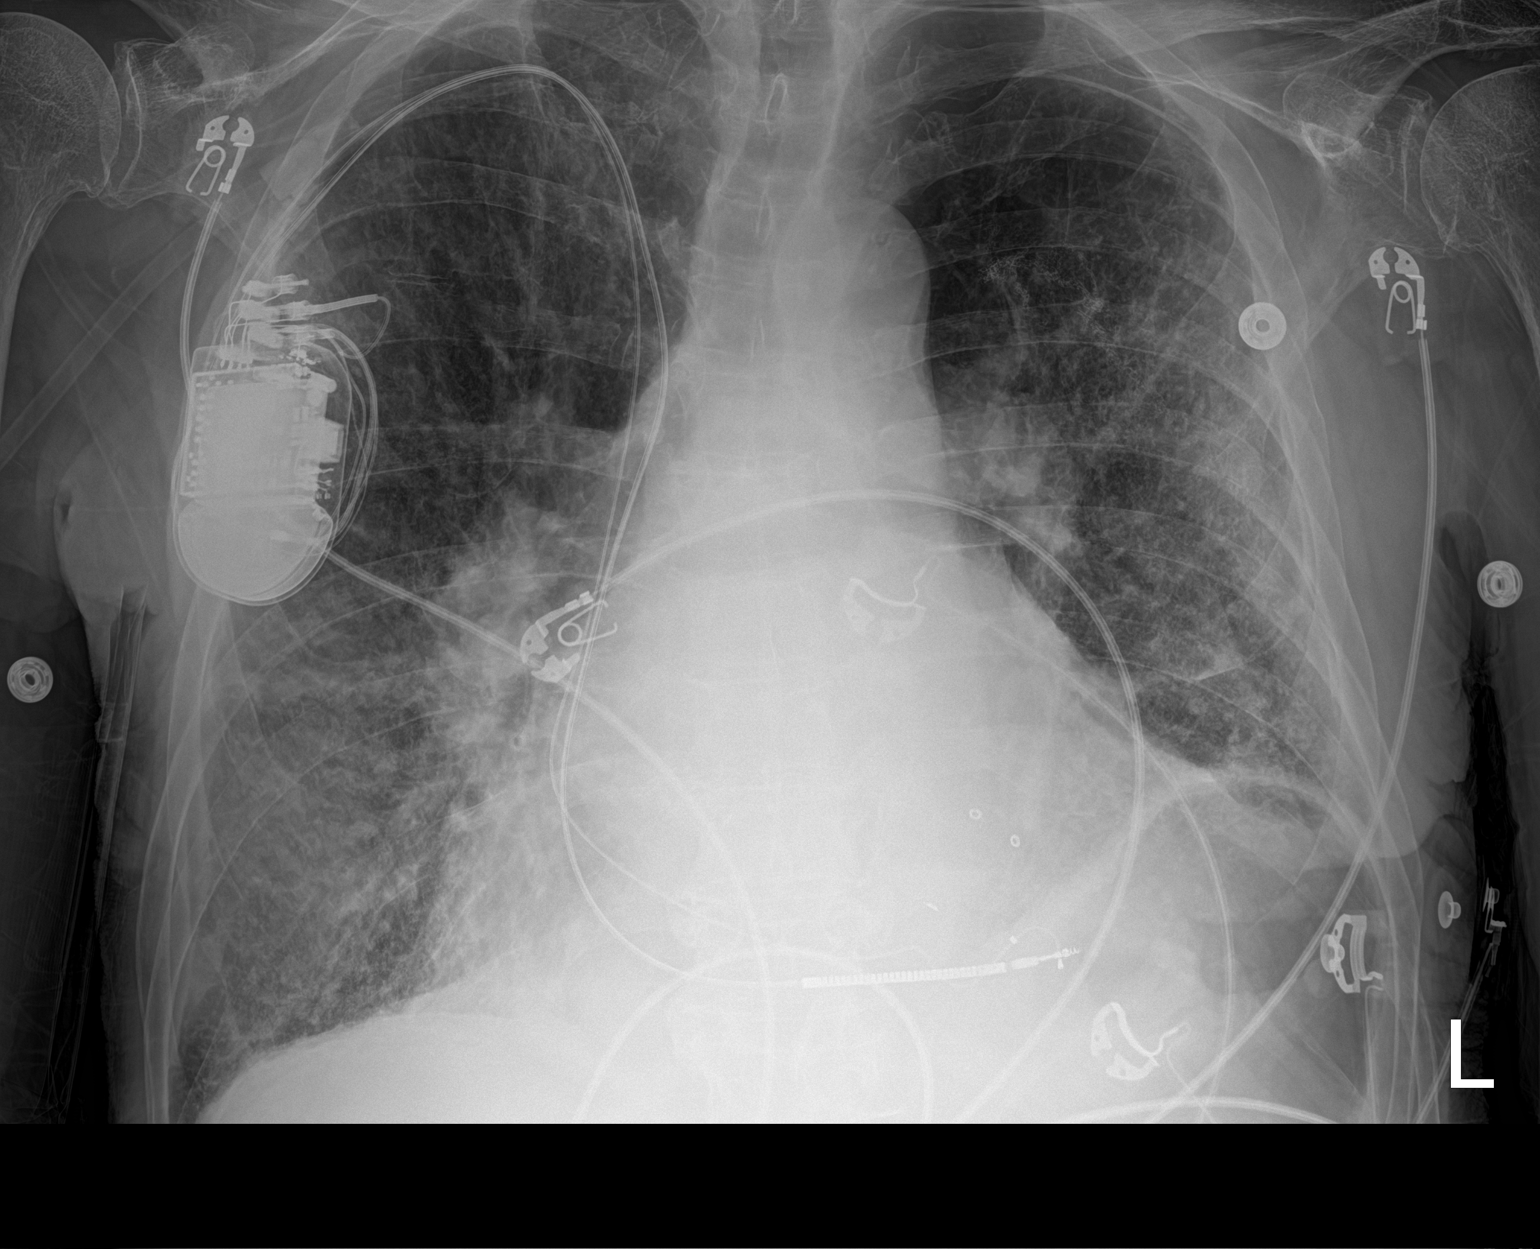

[2 of 2 positions shown; findings below may reference images not displayed]

FINDINGS: Chronic elevation of the left hemidiaphragm. There is cephalization
of the pulmonary vasculature, indistinctness of the interstitial
markings, and patchy airspace disease throughout the lungs
bilaterally suggestive of moderate pulmonary edema. No definite
pleural effusions. Mild cardiomegaly. Upper mediastinal contours are
within normal limits. Right-sided biventricular pacemaker/AICD with
lead tips projecting over the right ventricle and lateral wall the
left ventricle via the coronary sinus and coronary veins.
IMPRESSION: 1. The appearance the chest suggests congestive heart failure, as
above.

## 2021-01-05 IMAGING — DX DG CHEST 1V PORT
1 series · 1 of 1 positions shown · non-contrast
Comparison: 04/27/2019

CLINICAL DATA: Shortness of breath, feeling bad for 2 days.

EXAM:
PORTABLE CHEST 1 VIEW

[chest ap]
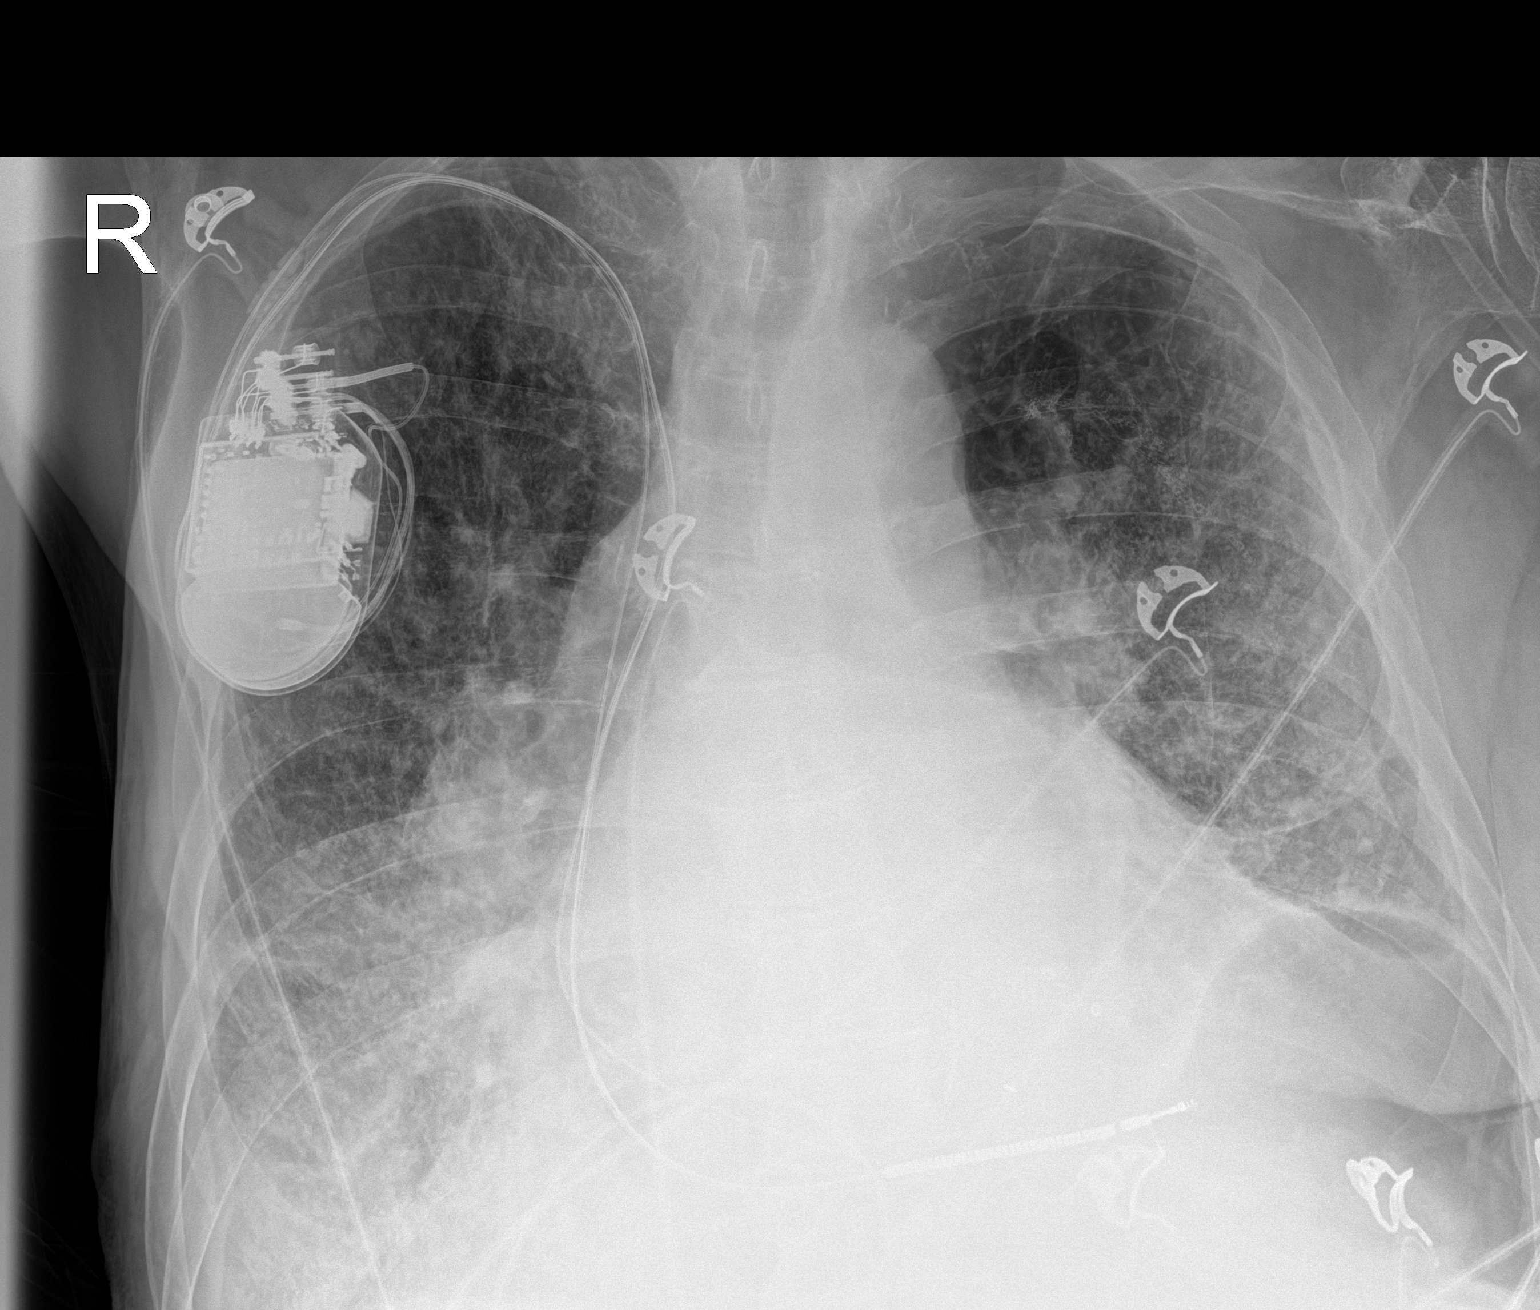

[1 of 1 positions shown; findings below may reference images not displayed]

FINDINGS: Cardiomediastinal contours remain enlarged. Signs of right-sided
dual lead pacer defibrillator are unchanged.

Signs of increased interstitial markings are increased from the
prior exam. Postoperative changes in the left lung are again noted.
Background pulmonary emphysema as before.

Graded opacity at the right lung base suggests pleural effusion.

No acute bone findings.
IMPRESSION: 1. Worsening pulmonary edema.
2. Graded opacity at the right lung base suggests pleural effusion.
3. Background pulmonary emphysema.
4. Stable enlargement of cardiomediastinal contours.

## 2021-02-27 IMAGING — CT CT CHEST W/ CM
2 of 4 series · 15 of 36 positions shown, 18 images · IV contrast (OMNIPAQUE)
Comparison: 06/10/2019

CLINICAL DATA: Restaging lung cancer.

EXAM:
CT CHEST WITH CONTRAST
TECHNIQUE: Multidetector CT imaging of the chest was performed during
intravenous contrast administration.
CONTRAST:  75mL OMNIPAQUE IOHEXOL 300 MG/ML  SOLN

[Series 2: axial st · axial · 0.74mm/px · z∈[-341,-25]mm · 12 of 186 slices shown, 15 images]
[im 14/186  mediastinal]
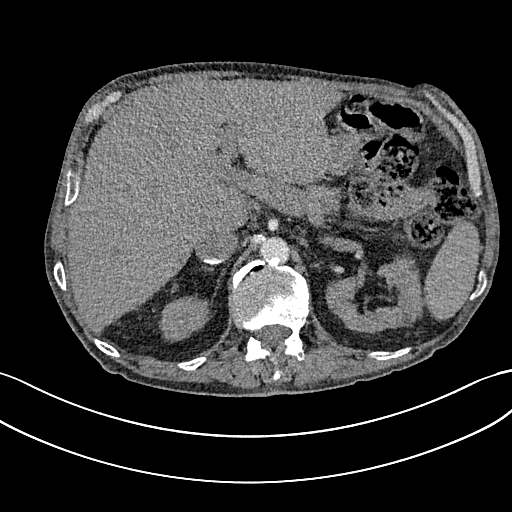
[im 14/186  lung]
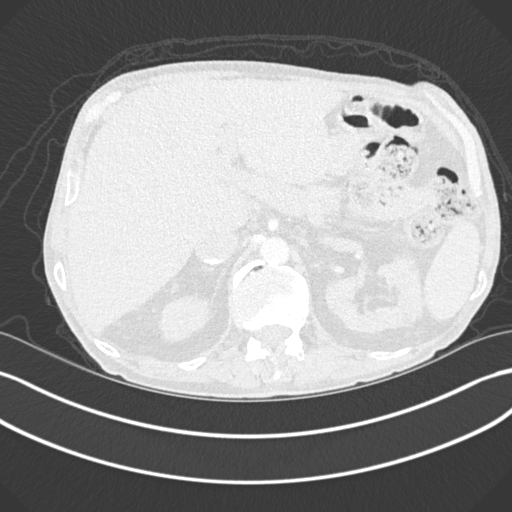
[im 27/186  lung]
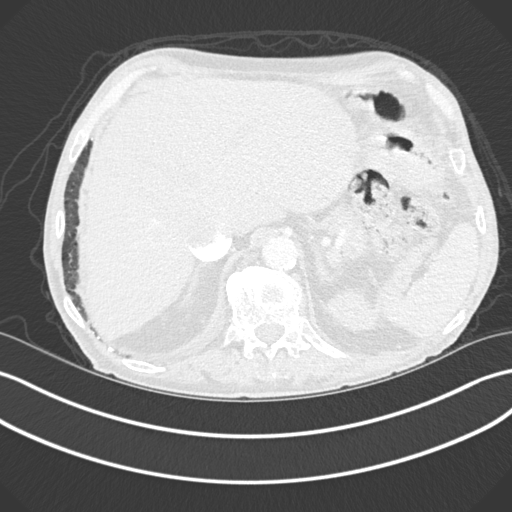
[im 40/186  lung]
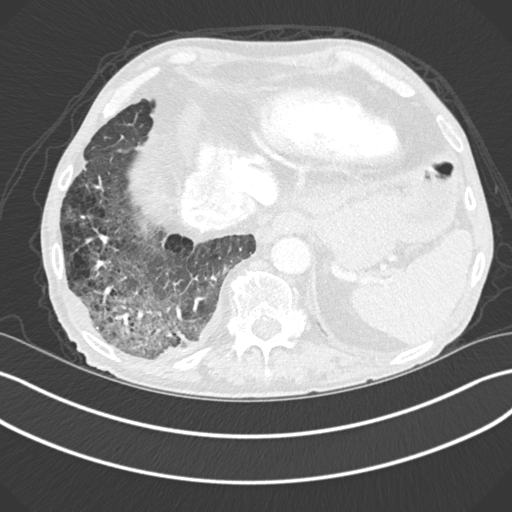
[im 53/186  lung]
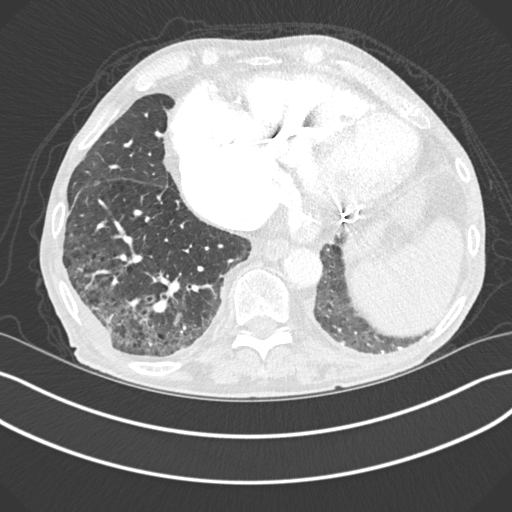
[im 67/186  mediastinal]
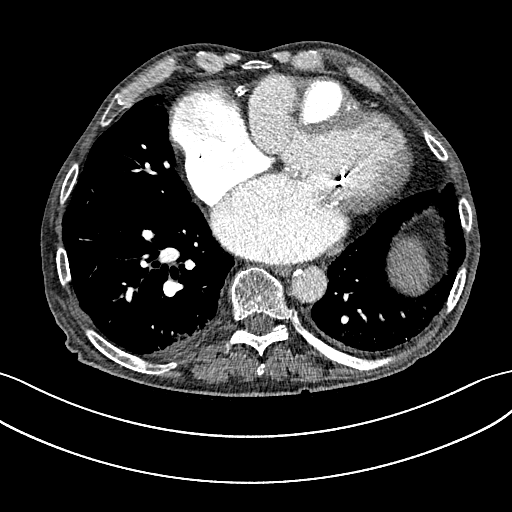
[im 67/186  lung]
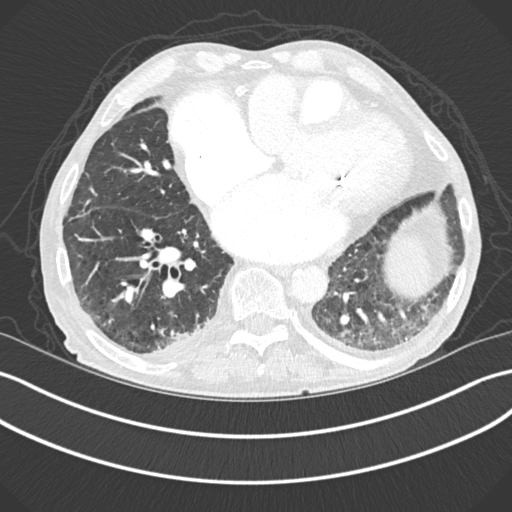
[im 80/186  lung]
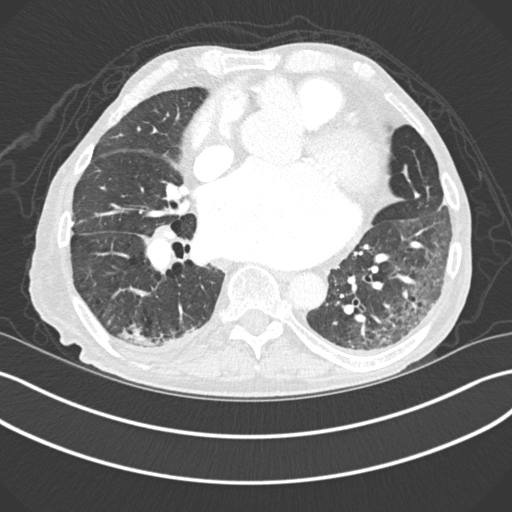
[im 106/186  lung]
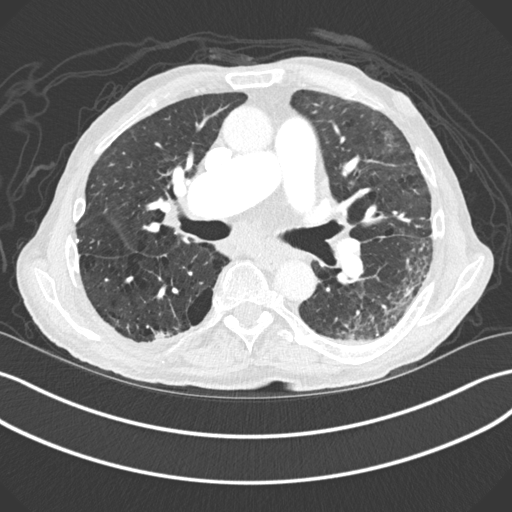
[im 119/186  lung]
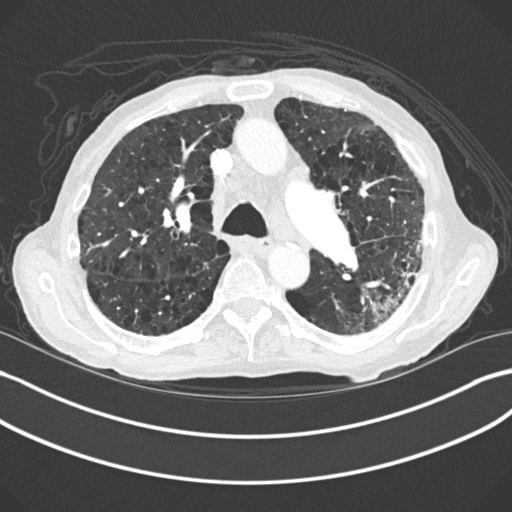
[im 133/186  mediastinal]
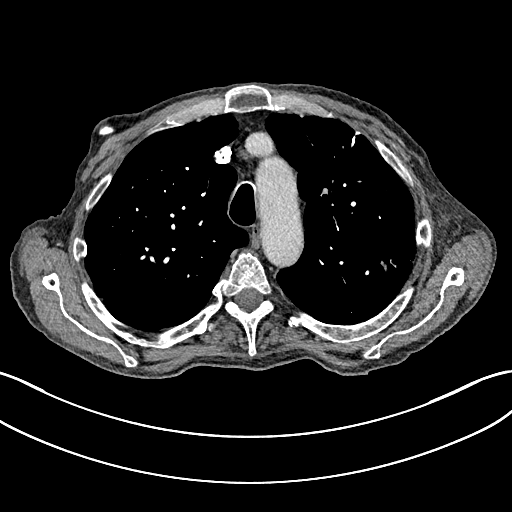
[im 133/186  lung]
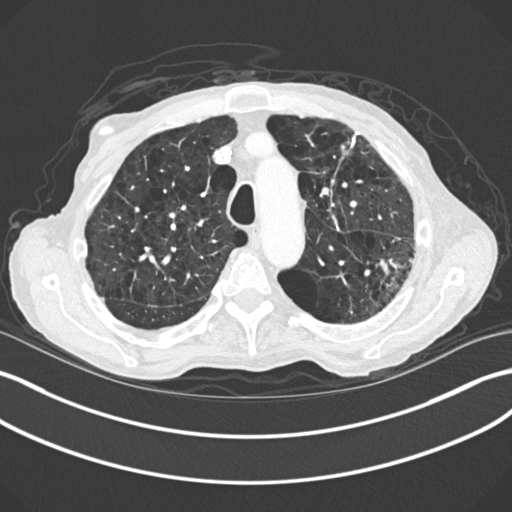
[im 146/186  lung]
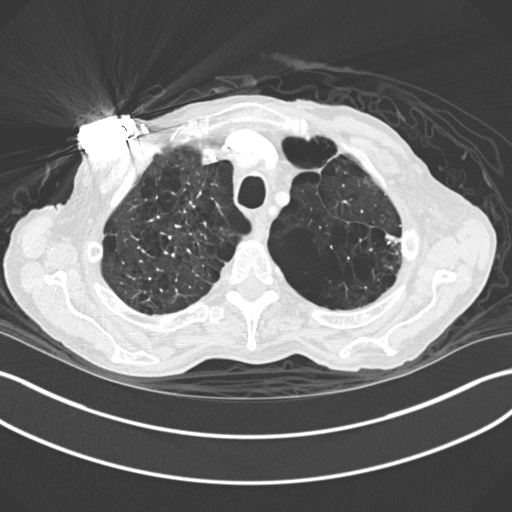
[im 159/186  lung]
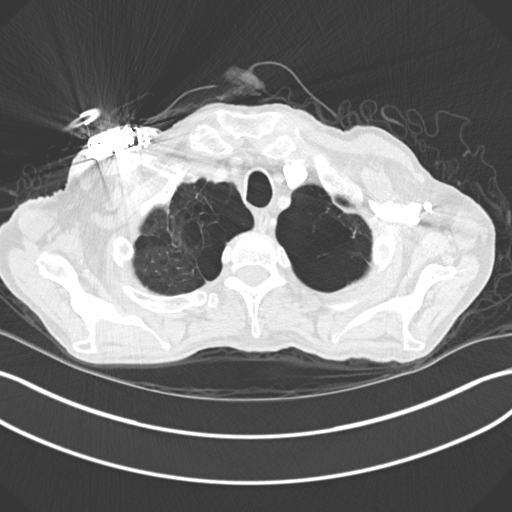
[im 172/186  lung]
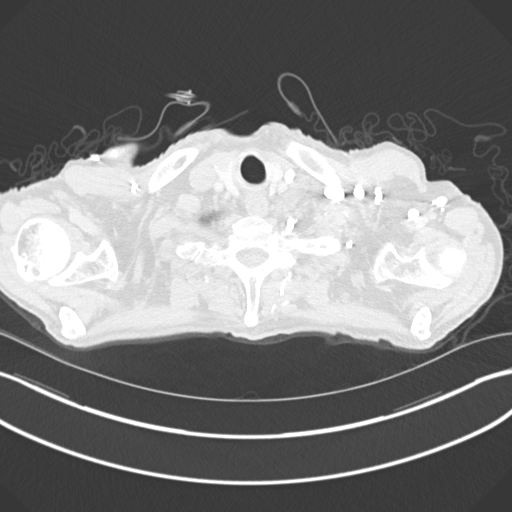

[Series 5: coronal · coronal · 0.74mm/px · 3 of 138 slices shown]
[im 28/138  lung]
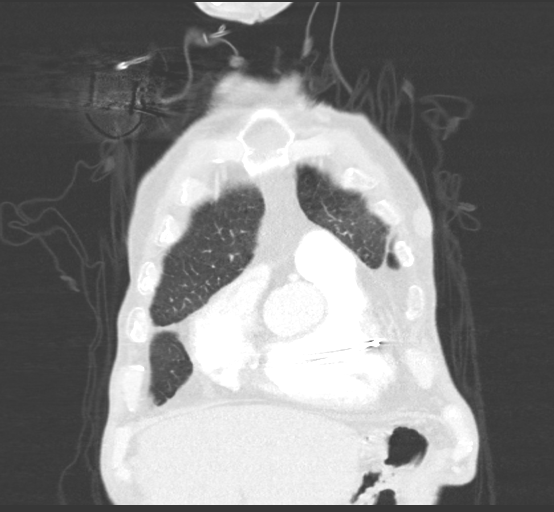
[im 55/138  lung]
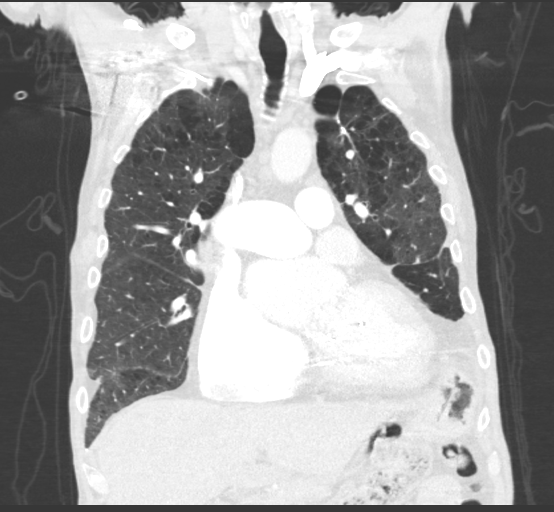
[im 83/138  lung]
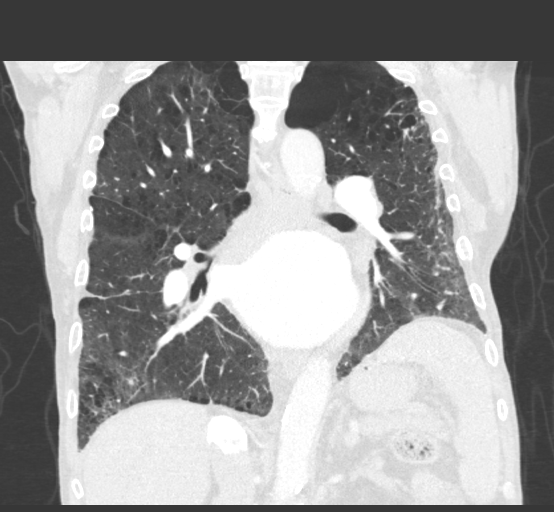

[15 of 36 positions shown; findings below may reference images not displayed]

FINDINGS: Cardiovascular: The heart size is markedly enlarged. No pericardial
effusion identified. Aortic atherosclerosis. Three vessel coronary
artery calcifications.

Mediastinum/Nodes: Index AP window lymph node measures 1.5 cm, image
66/2. Previously 1.6 cm.

Index azygoesophageal recess lymph node measures 1.7 cm, image 92/2.
Previously 2.1 cm.

Left supraclavicular lymph node measures 1.2 cm, image [DATE].
Previously 1.6 cm.

Normal appearance of the thyroid gland. The trachea appears patent
and is midline. Normal appearance of the esophagus.

Lungs/Pleura: Advanced changes of centrilobular and paraseptal
emphysema. Pleuroparenchymal scarring and thickening within the
posterior right lung base is unchanged. Bilateral peripheral lower
lung zone predominant ground-glass attenuation and interstitial
reticulation is noted. Postop change from left upper lobe wedge
resection noted with associated volume loss. No specific finding to
suggest residual or recurrence of tumor along the suture line.
Calcified granulomas identified within the anterolateral right upper
lobe.

Upper Abdomen: No acute abnormality. Aortic atherosclerosis.
Multiple left kidney cysts.

Musculoskeletal: Spondylosis identified. The bones appear
osteopenic. No suspicious bone lesions
IMPRESSION: 1. Interval decrease in size of mediastinal and left supraclavicular
lymph nodes.
2. No suspicious pulmonary nodule or mass identified.
3. Emphysema and aortic atherosclerosis.
4. Marked cardiac enlargement. Three vessel coronary artery
calcifications.

Aortic Atherosclerosis (FXEO0-KJ9.9) and Emphysema ([id number].E).

## 2021-06-07 IMAGING — CT CT ANGIO CHEST-ABD-PELV FOR DISSECTION W/ AND WO/W CM
2 of 7 series · 11 of 46 positions shown, 12 images · IV contrast (OMNI)
Comparison: Radiograph earlier this day. Chest CT 07/30/2019,
additional priors.

CLINICAL DATA: Chest pain or back pain, aortic dissection suspected
chest pain, back pain, known thoracic aortic root dilation,
aneurysm, concern for acute aortic dissection or other acute aortic
pathology

EXAM:
CT ANGIOGRAPHY CHEST, ABDOMEN AND PELVIS
TECHNIQUE: Non-contrast CT of the chest was initially obtained. Multidetector
CT imaging through the chest, abdomen and pelvis was performed using
the standard protocol during bolus administration of intravenous
contrast. Multiplanar reconstructed images and MIPs were obtained
and reviewed to evaluate the vascular anatomy.
CONTRAST:  100mL OMNIPAQUE IOHEXOL 350 MG/ML SOLN

[Series 5: dissection 3.0 i30f 3 · axial · 0.74mm/px · z∈[+821,+1424]mm · 8 of 245 slices shown, 9 images]
[im 29/245  soft-tissue]
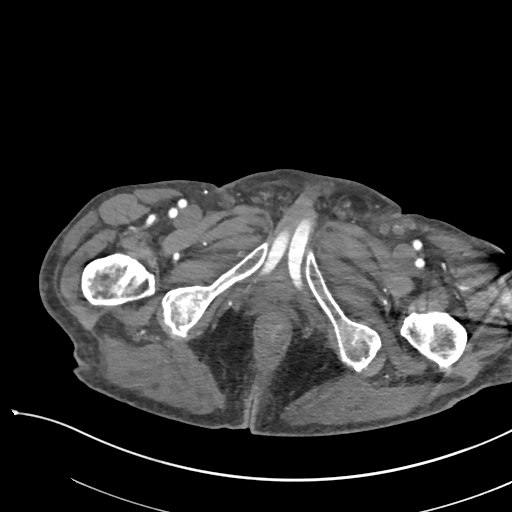
[im 29/245  bone]
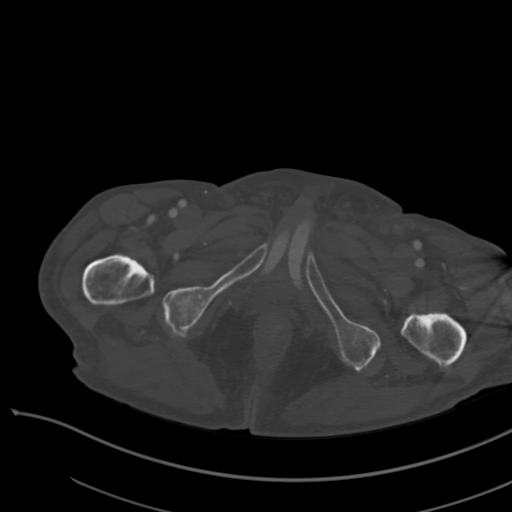
[im 58/245  soft-tissue]
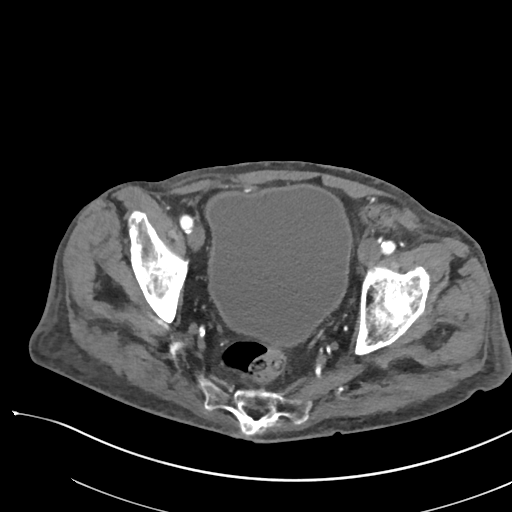
[im 87/245  soft-tissue]
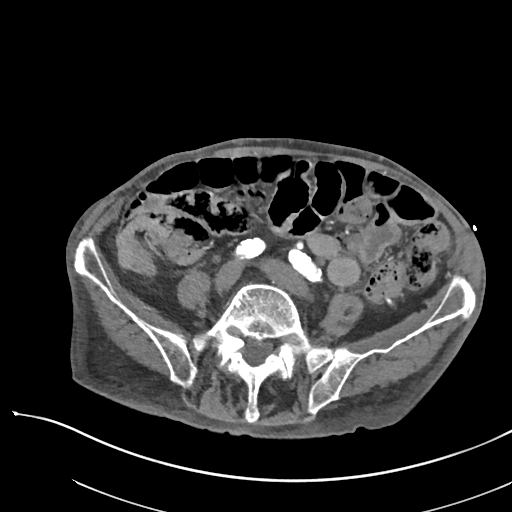
[im 115/245  soft-tissue]
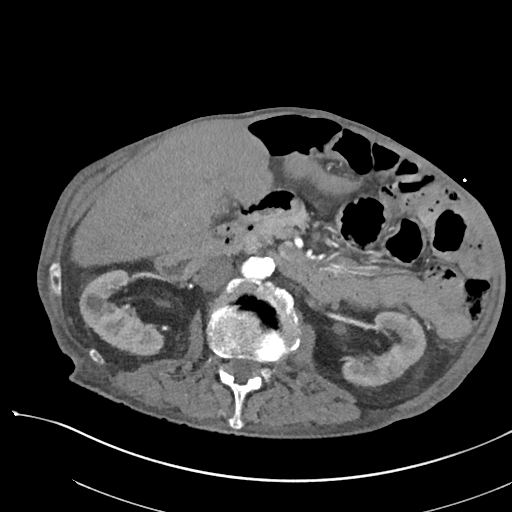
[im 144/245  soft-tissue]
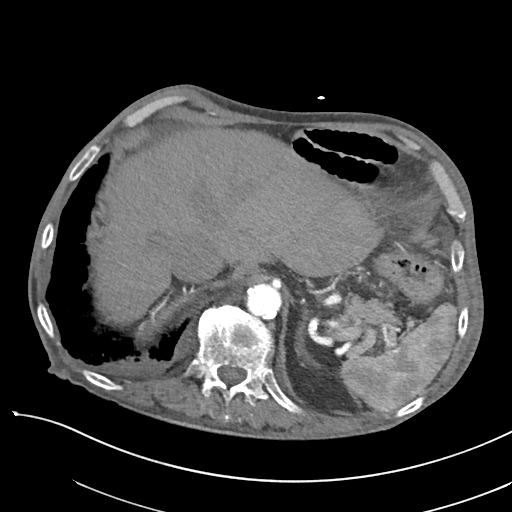
[im 173/245  soft-tissue]
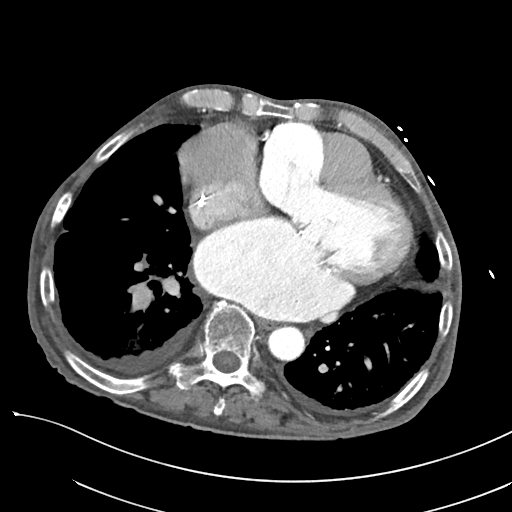
[im 201/245  soft-tissue]
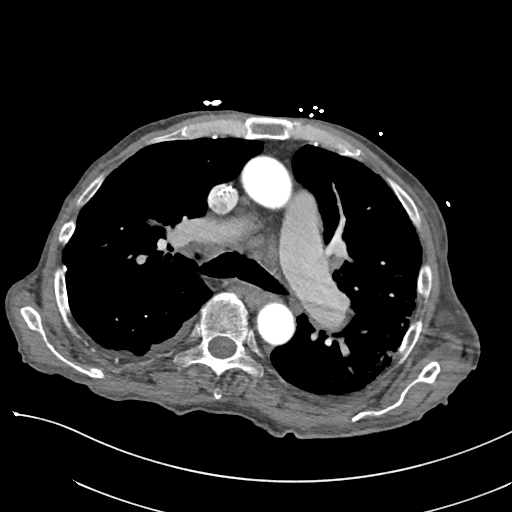
[im 230/245  soft-tissue]
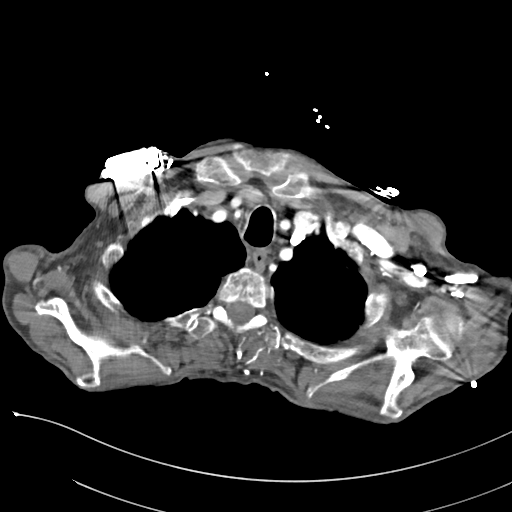

[Series 8: coronals · coronal · 0.80mm/px · 3 of 151 slices shown]
[im 38/151  soft-tissue]
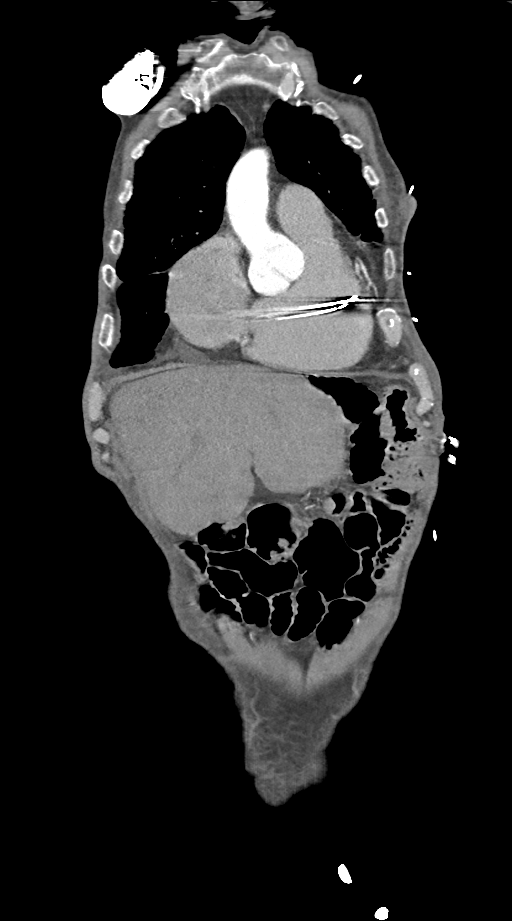
[im 76/151  soft-tissue]
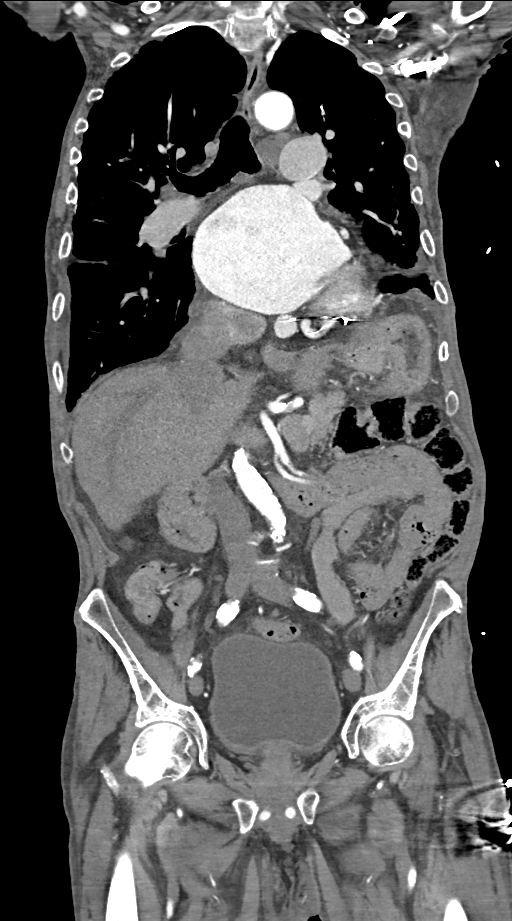
[im 113/151  soft-tissue]
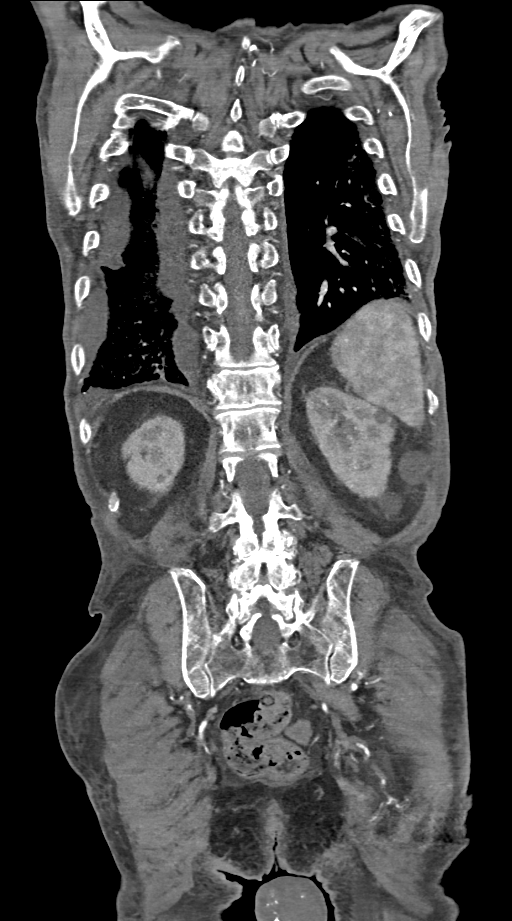

[11 of 46 positions shown; findings below may reference images not displayed]

FINDINGS: CTA CHEST FINDINGS

Cardiovascular: Sinus of Valsalva aneurysm is unchanged from prior
imaging. This measures approximately 5.1 cm when measured on coronal
reformats series 8, image 43. No evidence of aneurysm rupture,
stranding, or inflammation. Remainder of the thoracic aorta is
normal in caliber. There is no ascending, transverse, or descending
aortic aneurysm. Mild atherosclerosis. No aortic dissection, acute
aortic syndrome, or aortic hematoma on noncontrast exam. Scattered
coronary artery calcifications. Cardiomegaly with marked left atrial
dilatation. Borders of the left atrial appendage are diffusely
lobulated and thickened, potentially left atrial thrombus measuring
12 mm. There is a prosthetic mitral valve. Right-sided pacemaker in
place. No pericardial effusion. Limited assessment for pulmonary
embolism, no filling defects within the central pulmonary arteries.
Marked pulmonary artery enlargement involving the right greater than
left main pulmonary artery.

Mediastinum/Nodes: Mediastinal adenopathy with index nodes as
follows, correlating with prior exam.

AP window node measures 1.5 cm, series 5, image 42, unchanged.

Azagoesophageal recess node measures 1.7 cm, series 5, image 60,
unchanged.

Left supraclavicular node measures 1.4 cm, series 5, image 11,
previously 1.2 cm.

No definite new or progressive adenopathy. Decompressed esophagus.
No visualized thyroid nodule.

Lungs/Pleura: Advanced emphysema. Lobulated right pleural
thickening/fluid has slightly progressed from prior exam. There is
new fluid or thickening involving the right major fissure. Chain
sutures noted in the left upper lung. Ground-glass and interstitial
attenuation involving the dependent lower lobes with seen on prior
exam, slightly progressed. There is central bronchial thickening
with areas of mucous plugging in the lower lobes. Calcified
granuloma in the right lung.

Musculoskeletal: Bones are diffusely under mineralized. No focal
bone lesion. Remote right rib fractures, postsurgical change
involving right ribs.

Review of the MIP images confirms the above findings.

CTA ABDOMEN AND PELVIS FINDINGS

VASCULAR

Aorta: Moderate atherosclerosis without aneurysm, dissection,
vasculitis or acute aortic finding.

Celiac: Patent without evidence of aneurysm, dissection, vasculitis
or significant stenosis.

SMA: Patent without evidence of aneurysm, dissection, vasculitis or
significant stenosis. There is a replaced hepatic artery arises from
the SMA.

Renals: Both renal arteries are patent without evidence of aneurysm,
dissection, vasculitis, fibromuscular dysplasia or significant
stenosis. There are accessory bilateral renal arteries to the lower
poles which are also patent.

IMA: Patent without evidence of aneurysm, dissection, vasculitis or
significant stenosis.

Inflow: Advanced calcified plaque without aneurysm, dissection,
severe stenosis or vasculitis.

Veins: No obvious venous abnormality within the limitations of this
arterial phase study.

Review of the MIP images confirms the above findings.

NON-VASCULAR

Hepatobiliary: Evaluation for focal lesion is limited on arterial
phase imaging. There are nodular hepatic contours. No evidence of
focal lesion. Partially distended gallbladder without calcified
gallstone.

Pancreas: No ductal dilatation or inflammation.

Spleen: Normal in size and arterial enhancement.

Adrenals/Urinary Tract: No adrenal nodule. No hydronephrosis. No
perinephric edema. Bilateral renal cortical cysts of varying sizes.
Mild bladder distention without wall thickening.

Stomach/Bowel: Bowel evaluation is limited in the absence of enteric
contrast, arterial phase imaging, and paucity of intra-abdominal
fat. Stomach is decompressed. No bowel dilatation or obstruction. No
bowel inflammation. There are areas of mesenteric swirling but no
evidence of ischemia or vascular compromise. The appendix is not
definitively visualized.

Lymphatic: No abdominopelvic adenopathy.

Reproductive: Enlarged prostate gland causes mass effect on the
bladder base. Penile prosthesis.

Other: No ascites or free air.

Musculoskeletal: Mild T12 and L1 superior endplate compression
fractures. Mild L4 superior endplate compression fracture.
Multilevel degenerative change in the lumbar spine with prominent
degenerative disc disease and facet hypertrophy. Grade 1
anterolisthesis of L4 on L5 and retrolisthesis of L3 on L4 likely
degenerative and facet mediated. No evidence of focal bone lesion.
Bones are diffusely under mineralized.

Review of the MIP images confirms the above findings.
IMPRESSION: 1. No aortic dissection, acute aortic syndrome, or acute aortic
abnormality.
2. Unchanged size of the sinus of Valsalva aneurysm.
3. Cardiomegaly with marked left atrial dilatation. Borders of the
left atrial appendage are diffusely lobulated and thickened,
potentially left atrial appendage thrombus measuring 12 mm.
Recommend correlation with echocardiogram.
4. Advanced emphysema.
5. Small right pleural effusion which is partially lobulated, now
with fluid in the major fissure. Trace left pleural effusion.
6. Mediastinal adenopathy stable from prior exam. Left
supraclavicular node has slightly increased in size. Recommend
attention at follow-up.
7. Enlarged prostate gland causes mass effect on the bladder base.
8. Mild T12 and L1 superior endplate compression fractures. Mild L4
superior endplate compression fracture. These fractures are age
indeterminate.

Aortic Atherosclerosis (JC7Y8-5I1.1) and Emphysema (JC7Y8-MS0.5).

## 2021-06-07 IMAGING — CR DG CHEST 2V
3 series · 3 of 3 positions shown · non-contrast
Comparison: 06/07/2019

CLINICAL DATA: Shortness of breath. Central chest pain started
around noon today. History of atrial septal defect.

EXAM:
CHEST - 2 VIEW

[chest lat]
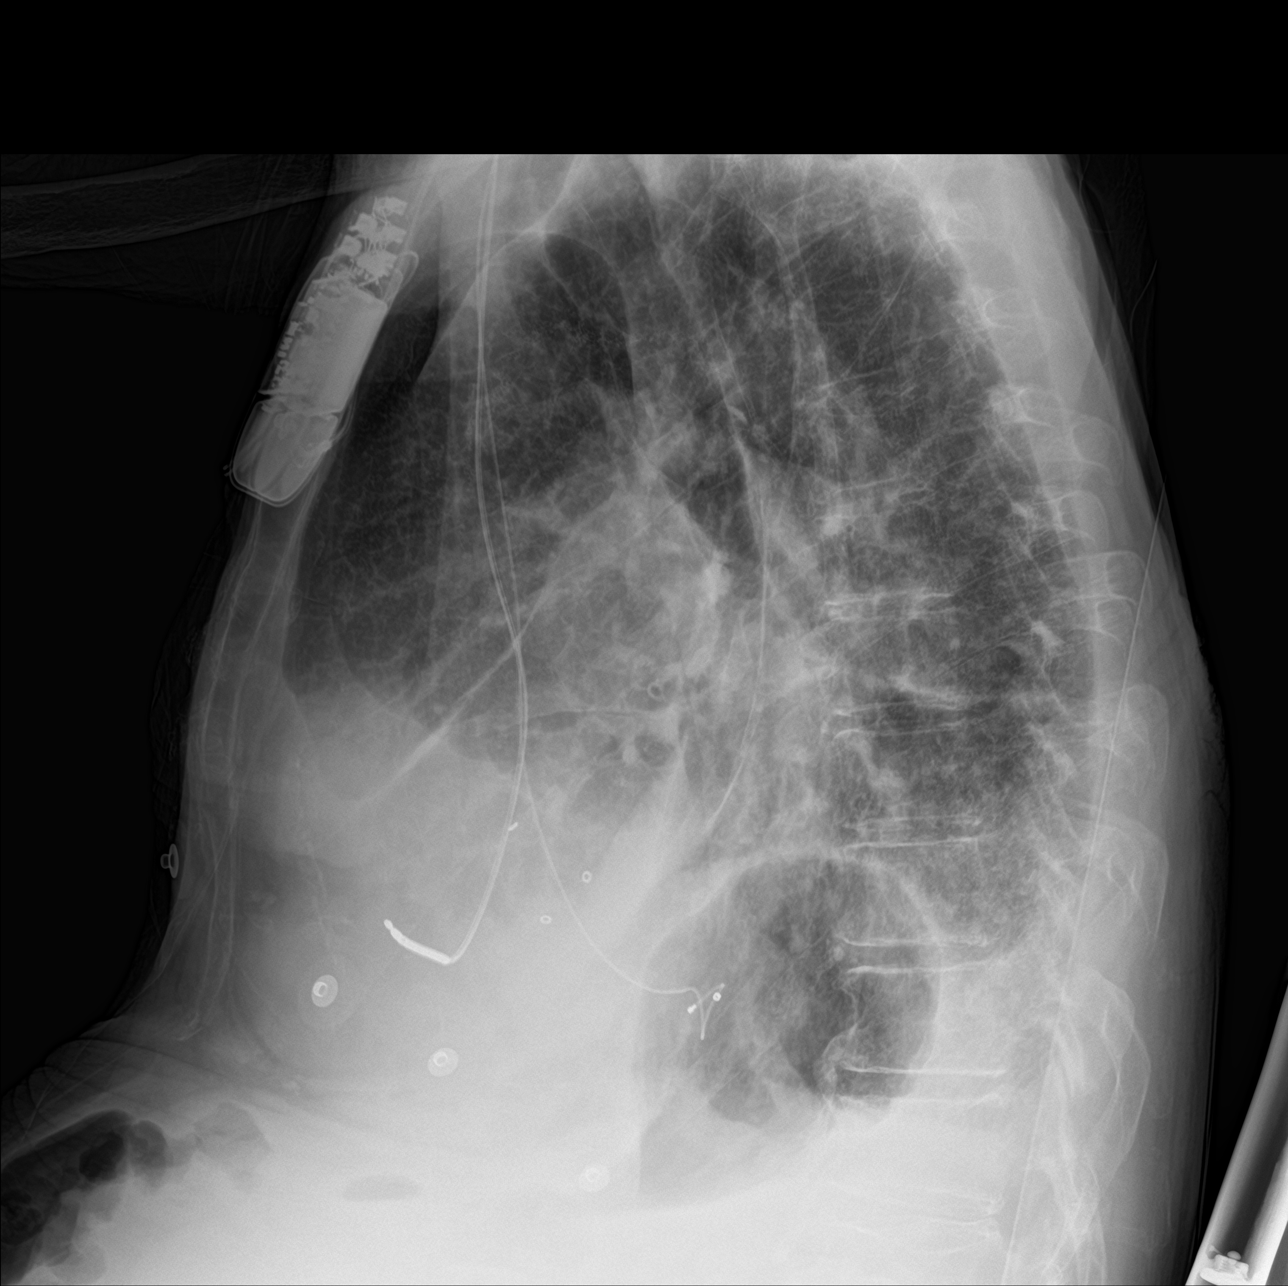

[chest ap (1 of 2)]
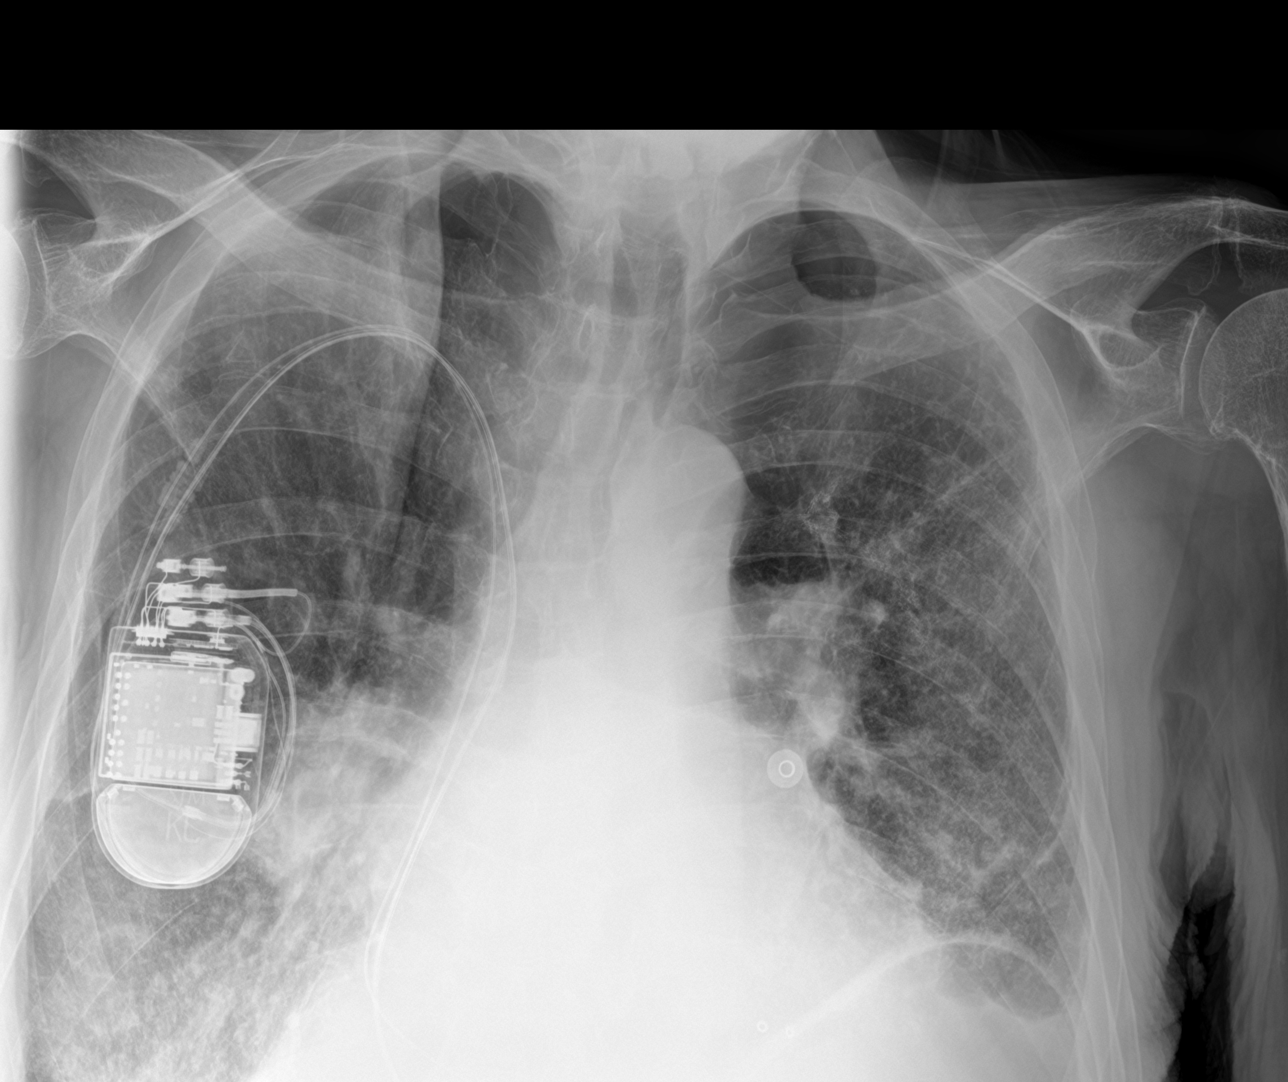

[chest ap (2 of 2)]
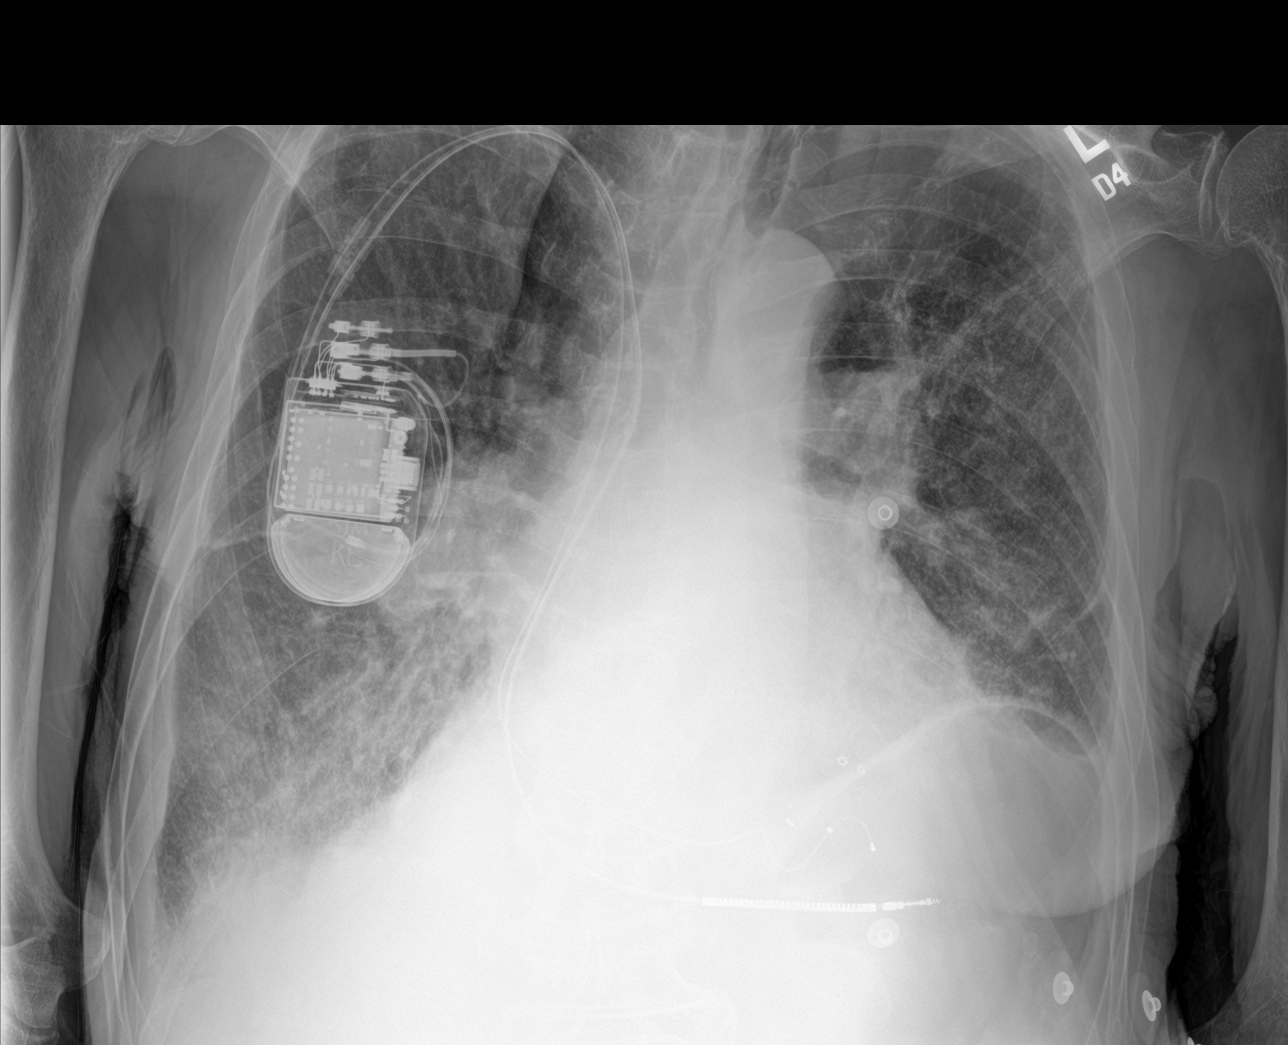

[3 of 3 positions shown; findings below may reference images not displayed]

FINDINGS: Heart is enlarged and stable in configuration. Patient has a
RIGHT-sided pacemaker with leads to the RIGHT ventricle and coronary
sinus. There are apical bullous changes, LEFT greater than RIGHT.
Chronic lung opacity identified at the RIGHT lung base. There is
stable elevation of LEFT hemidiaphragm.
IMPRESSION: Stable cardiomegaly. Stable emphysematous changes and pulmonary
opacities.

## 2021-06-15 IMAGING — DX DG CHEST 1V PORT
1 series · 1 of 1 positions shown · non-contrast
Comparison: 11/13/2019.  CT 11/07/2019.

CLINICAL DATA: Shortness of breath.  Chest pain.

EXAM:
PORTABLE CHEST 1 VIEW

[chest]
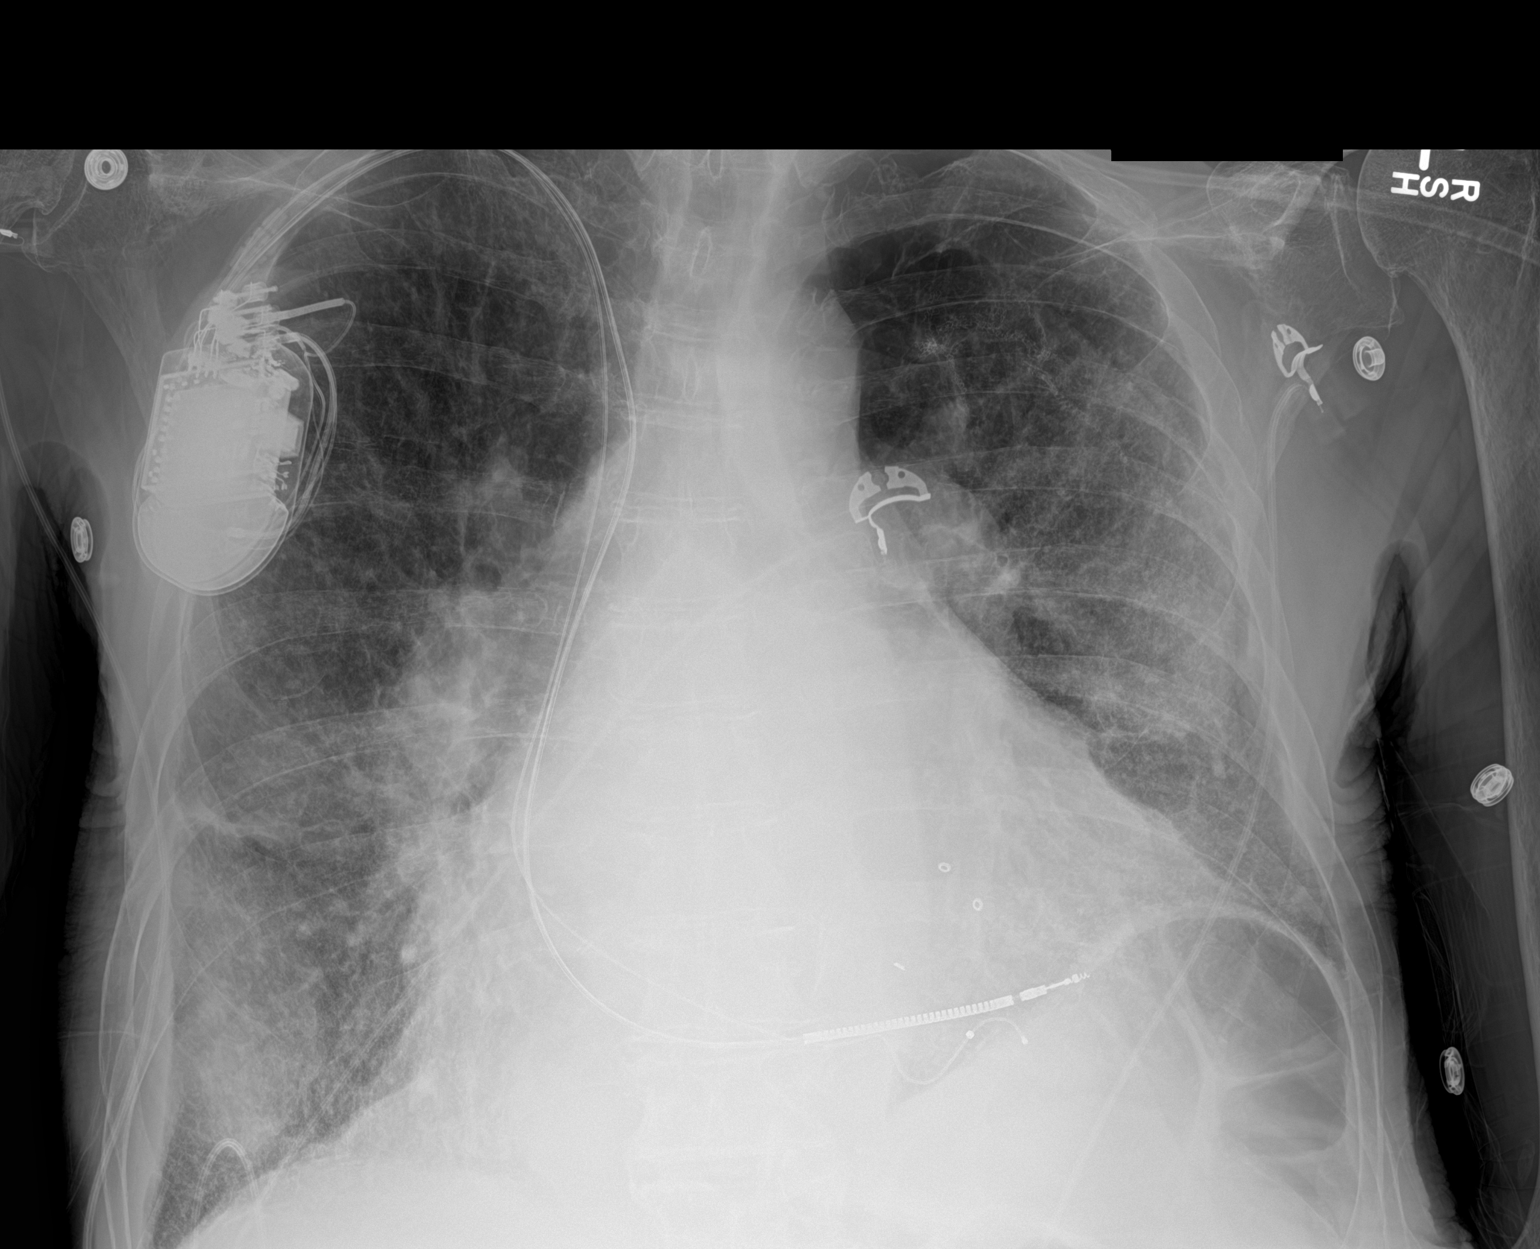

[1 of 1 positions shown; findings below may reference images not displayed]

FINDINGS: AICD noted stable position. Cardiomegaly. Surgical sutures left
lung. Stable changes of bullous COPD. Diffuse bilateral pulmonary
infiltrates/edema. Small right pleural effusion. Stable elevation
left hemidiaphragm.
IMPRESSION: 1.  AICD noted in stable position.

2. Cardiomegaly. Diffuse bilateral pulmonary infiltrates/edema again
noted. Small right pleural effusion.

3. Stable changes of bullous COPD. Stable elevation left
hemidiaphragm.

## 2021-06-16 IMAGING — DX DG LUMBAR SPINE 2-3V
3 series · 3 of 3 positions shown · non-contrast
Comparison: None.

CLINICAL DATA: Lower back pain.

EXAM:
LUMBAR SPINE - 2-3 VIEW

[l-spine ap]
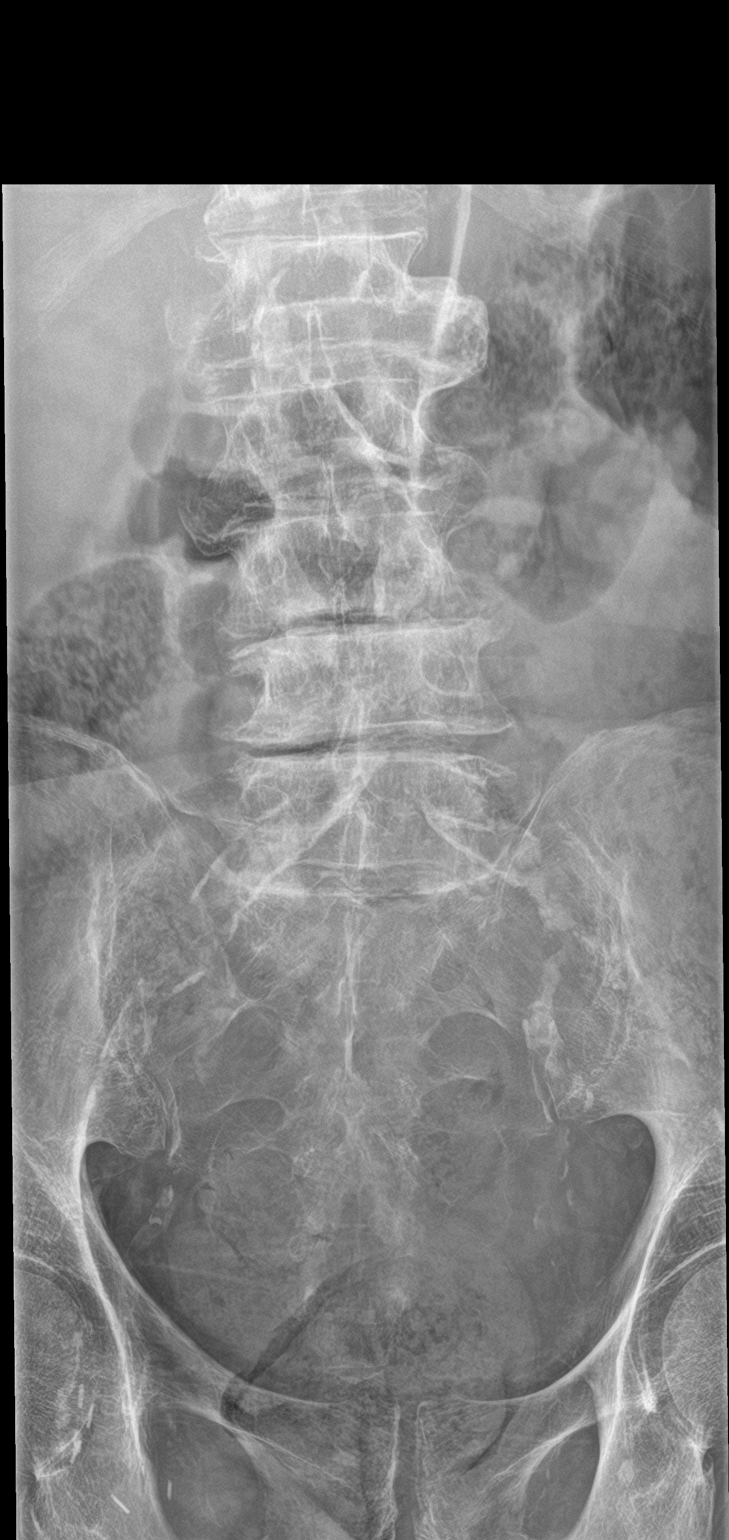

[l-spine lat]
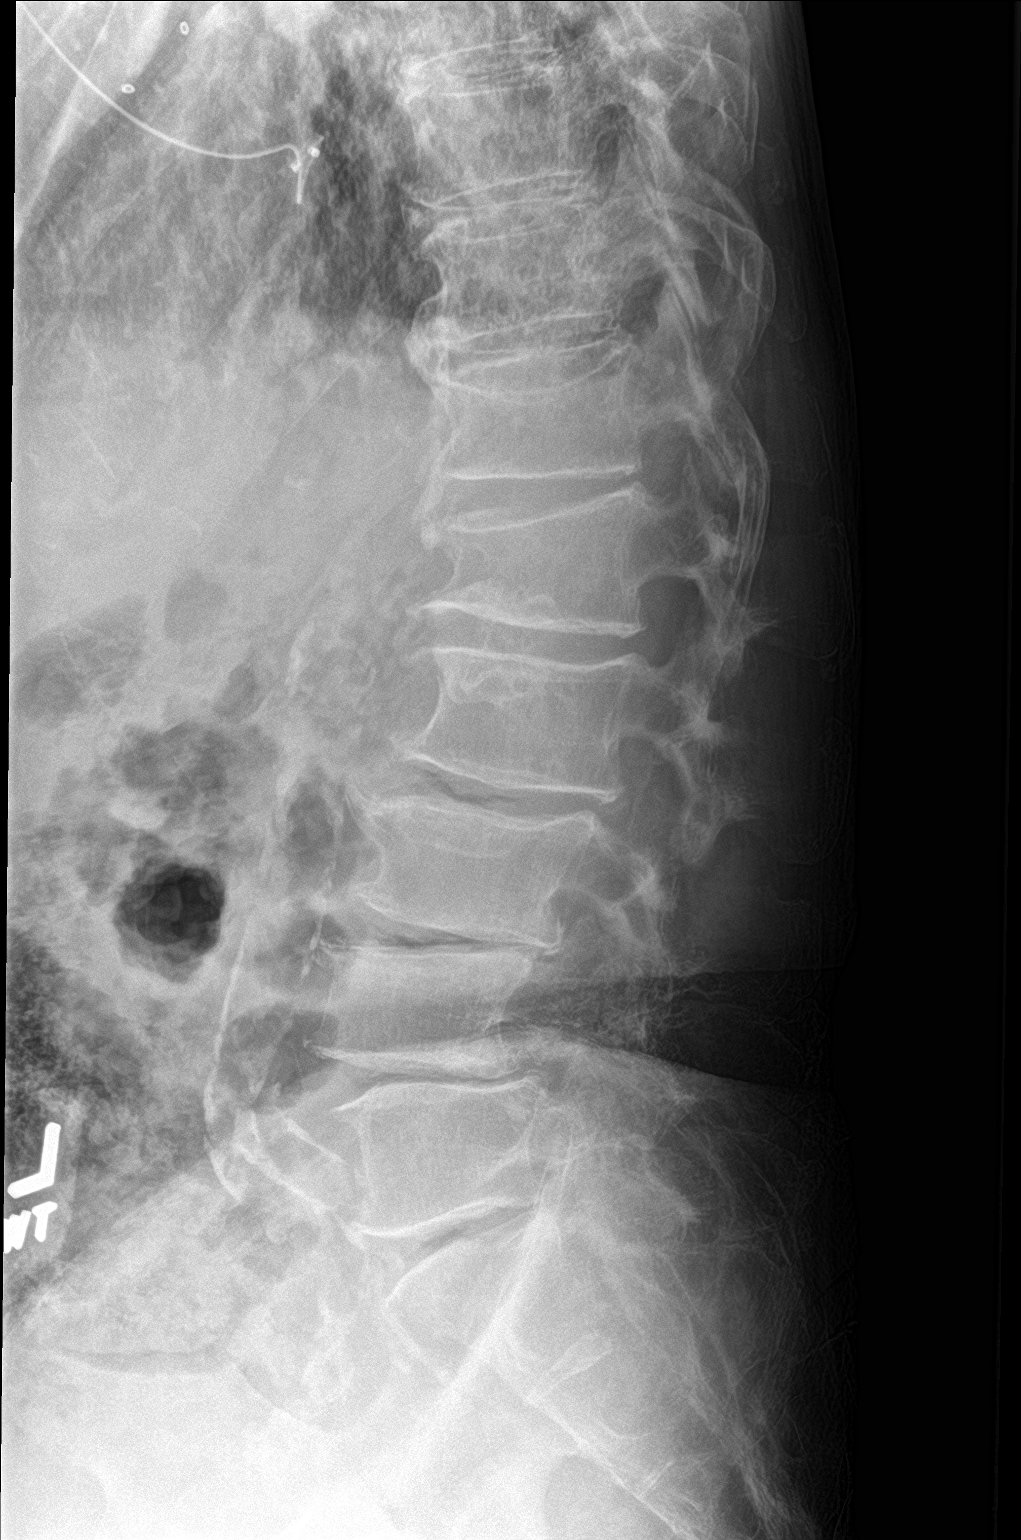

[l-spine spot]
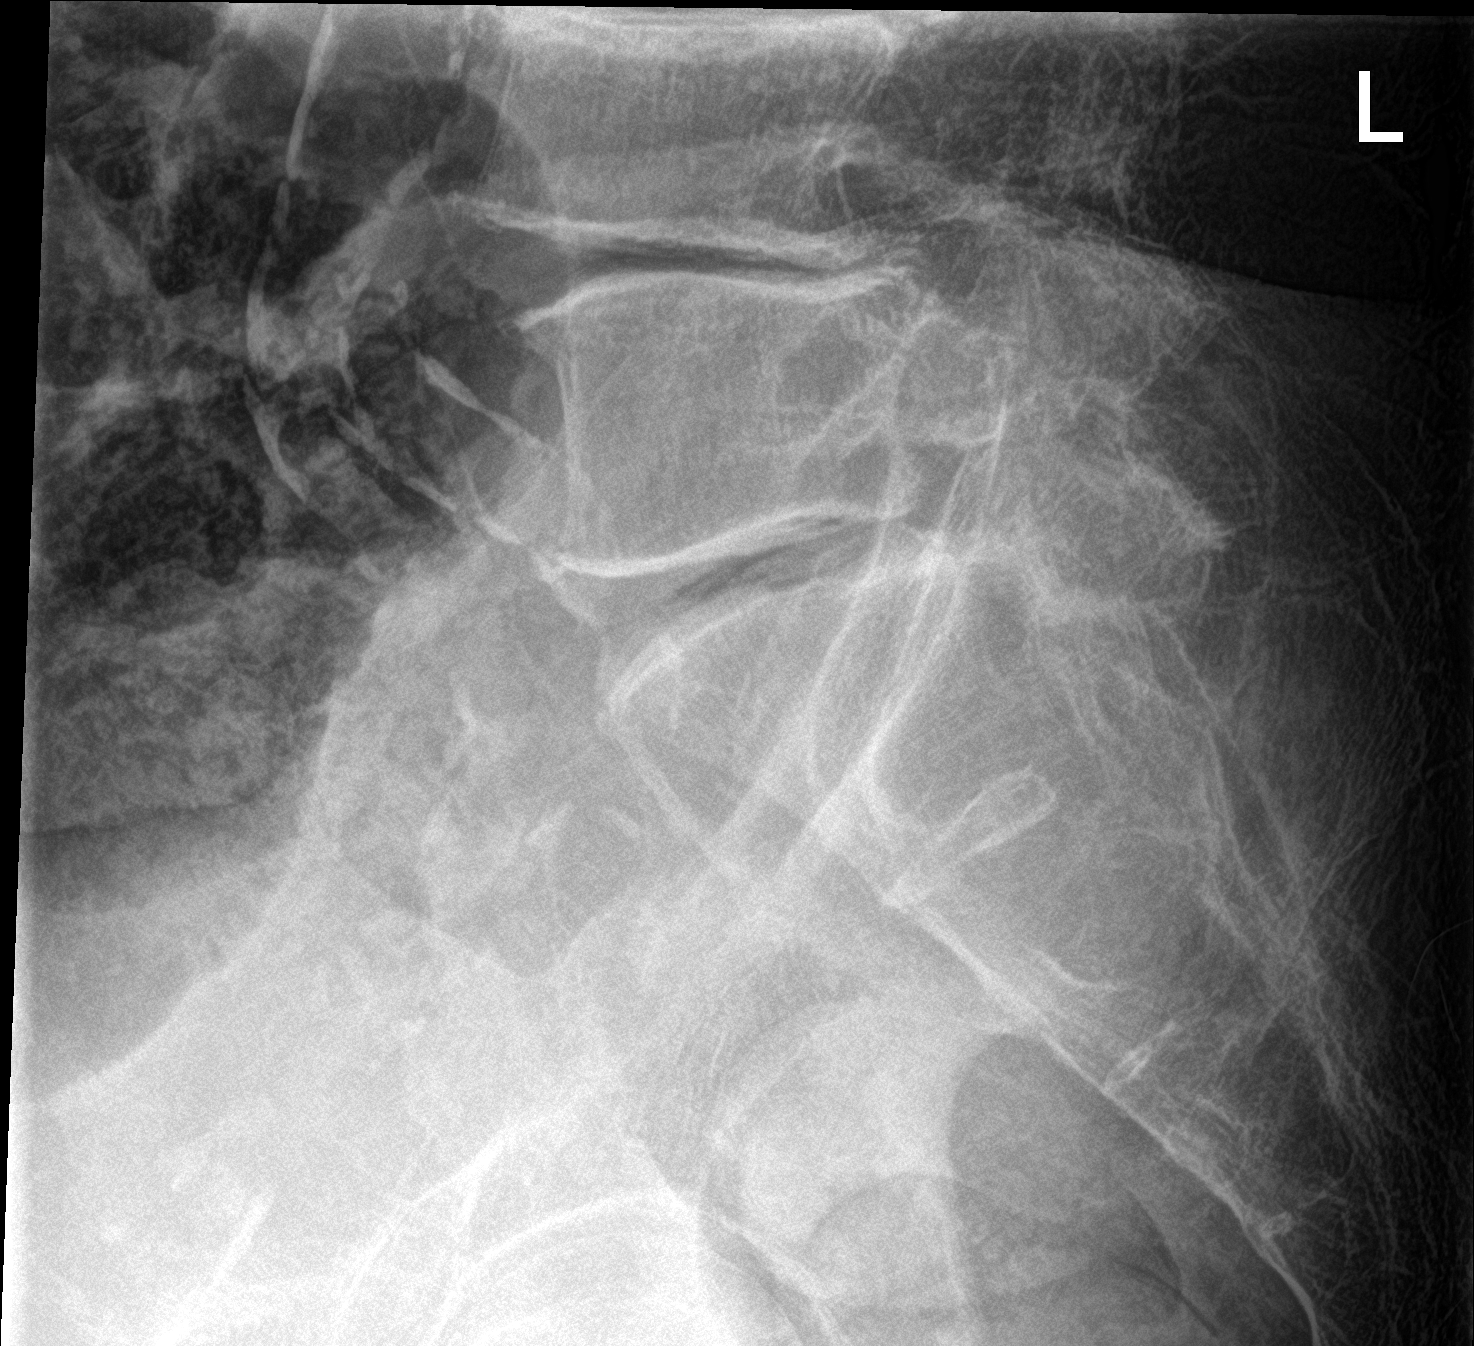

[3 of 3 positions shown; findings below may reference images not displayed]

FINDINGS: There is no evidence of an acute lumbar spine fracture. Chronic loss
of vertebral body height is seen at the levels of T12 and L1.
Approximately 2 mm retrolisthesis of the L3 vertebral body is noted
on L4. Approximately 2 mm anterolisthesis of the L4 vertebral body
is seen on L5. There is moderate severity multilevel endplate
sclerosis with moderate severity multilevel intervertebral disc
space narrowing. Marked severity calcification of the abdominal
aorta and bilateral common iliac arteries is seen.
IMPRESSION: 1. Chronic loss of vertebral body height at T12 and L1.
2. Moderate severity multilevel degenerative disc disease.
3. Marked severity atherosclerotic calcification of the abdominal
aorta and bilateral common iliac arteries.

## 2021-06-17 IMAGING — DX DG CHEST 1V PORT
1 series · 1 of 1 positions shown · non-contrast
Comparison: November 15, 2019

CLINICAL DATA: Shortness of breath.

EXAM:
PORTABLE CHEST 1 VIEW

[chest ap]
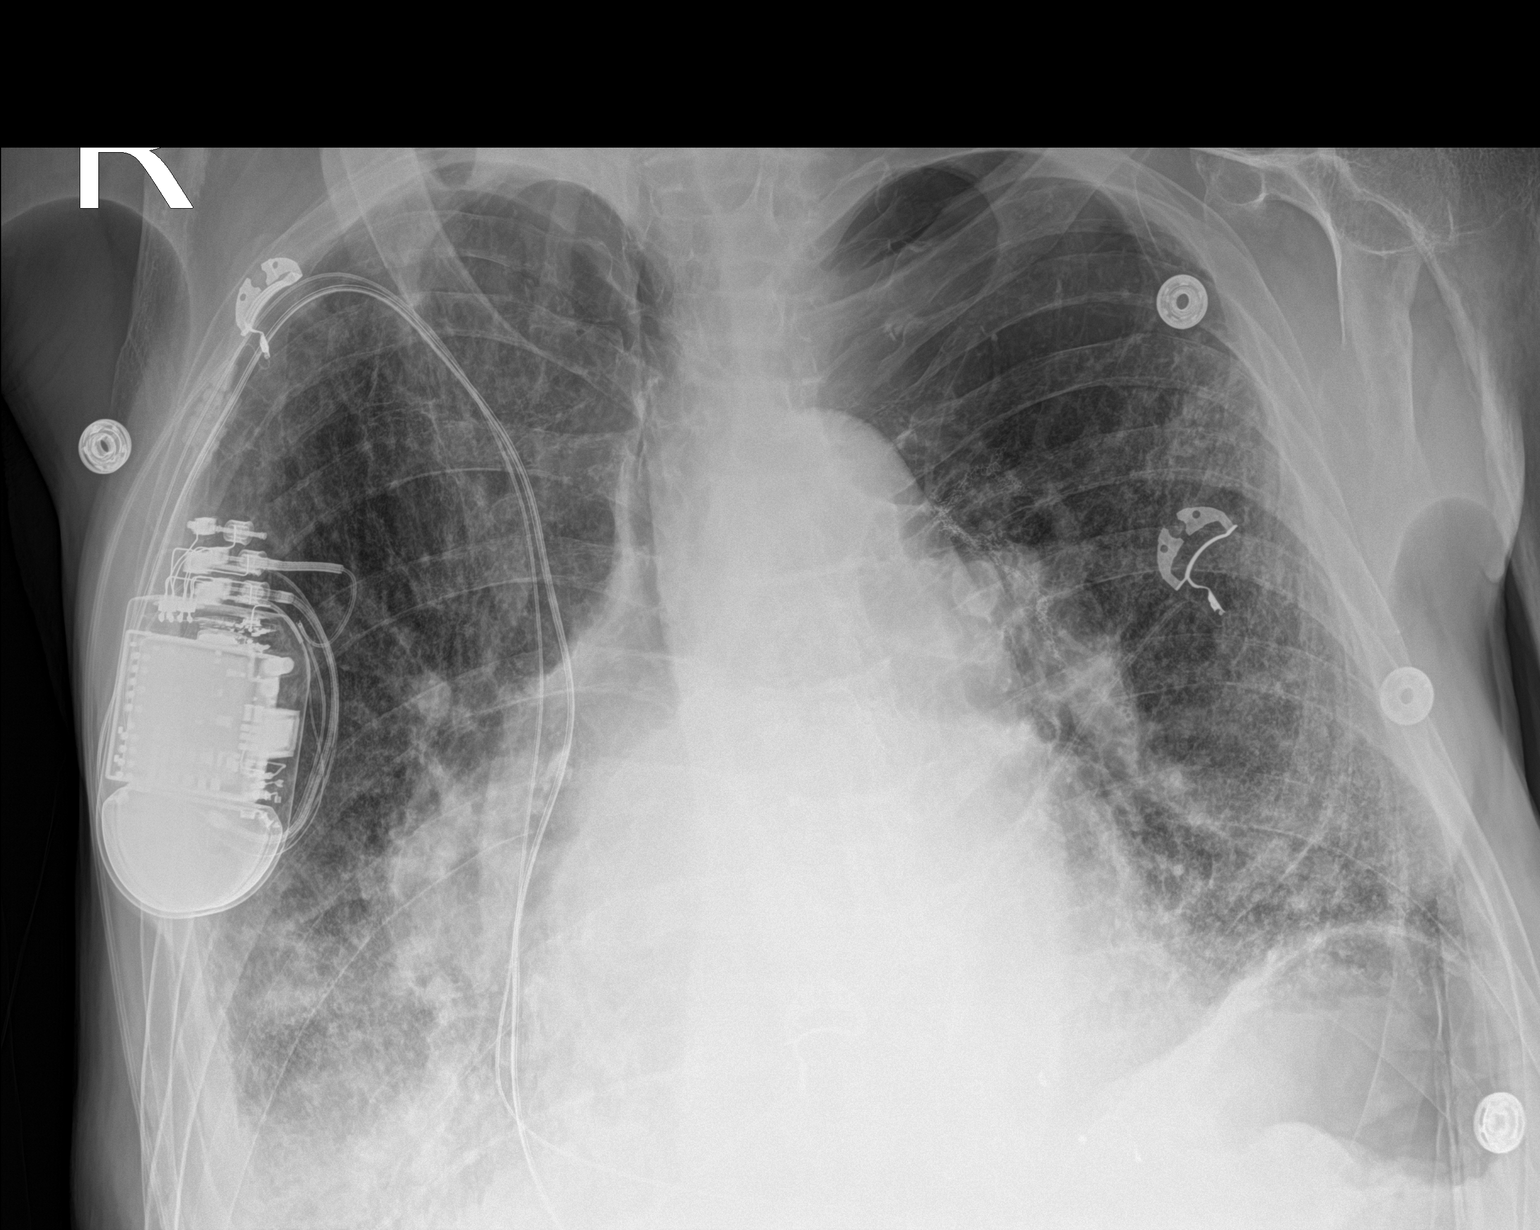

[1 of 1 positions shown; findings below may reference images not displayed]

FINDINGS: Right-sided cardiac pacemaker/defibrillator is in stable position.

The cardiac silhouette is stably enlarged.

Moderately increased interstitial markings. Small bilateral pleural
effusions. Bullous emphysematous changes in the upper lobes again
noted.

Osseous structures are without acute abnormality. Soft tissues are
grossly normal.
IMPRESSION: 1. Stably enlarged cardiac silhouette.
2. Moderate interstitial pulmonary edema with small bilateral
pleural effusions.

## 2021-06-18 IMAGING — CR DG CLAVICLE*L*
2 series · 2 of 2 positions shown · non-contrast
Comparison: None.

CLINICAL DATA: Left shoulder pain

EXAM:
LEFT CLAVICLE - 2+ VIEWS

[clavicle ap]
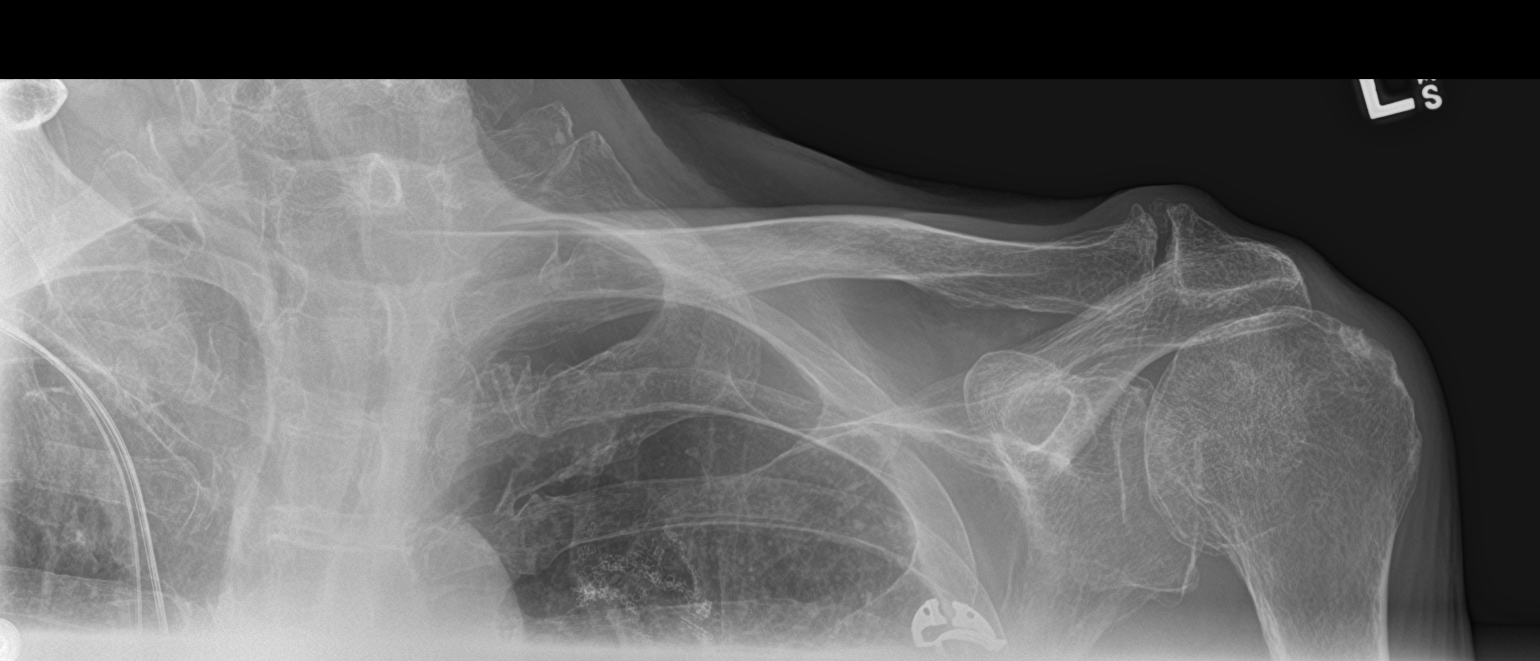

[clavicle axial]
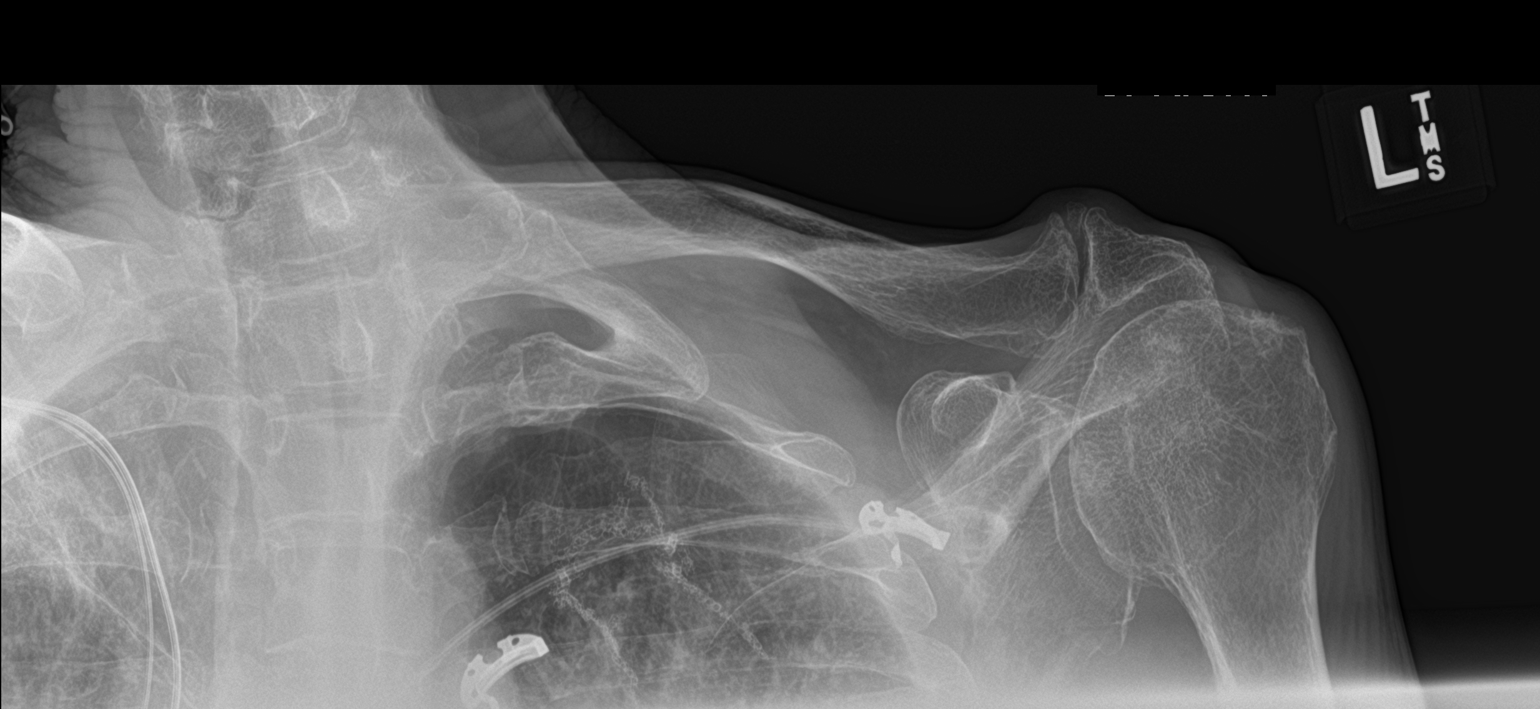

[2 of 2 positions shown; findings below may reference images not displayed]

FINDINGS: Negative for fracture

AC degenerative change in spurring. Narrowing of the acromial
humeral distance compatible with rotator cuff disease. Mild
degenerative change in the shoulder joint.

Staples in the left upper lobe.
IMPRESSION: Rotator cuff disease.  Negative for fracture.

## 2021-07-06 IMAGING — DX DG CHEST 2V
1 series · 1 of 1 positions shown · non-contrast
Comparison: 11/17/2019

CLINICAL DATA: Chest pain and shortness of breath. Previous
myocardial infarct congestive heart failure.

EXAM:
CHEST - 2 VIEW

[chest lat]
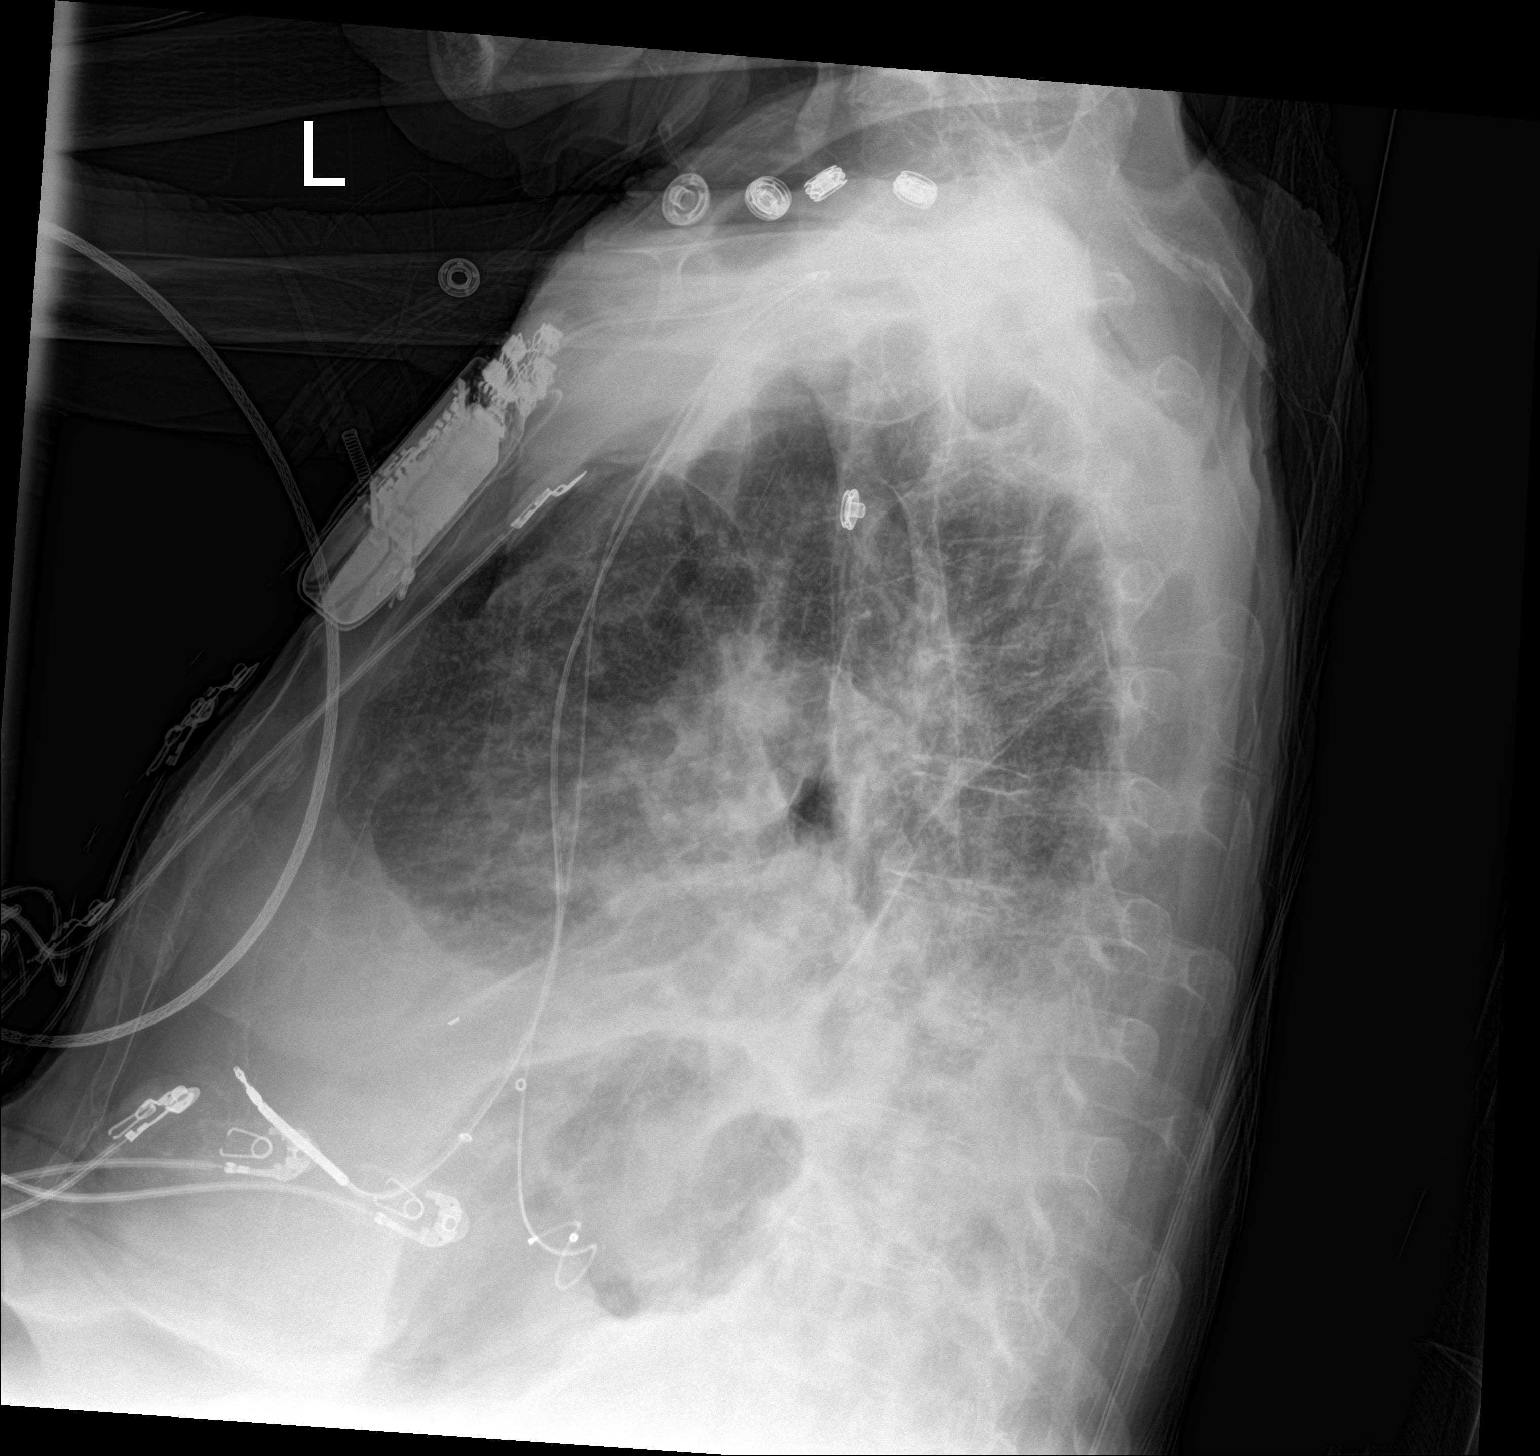

[1 of 1 positions shown; findings below may reference images not displayed]

FINDINGS: Moderate cardiac enlargement shows no significant change.
Transvenous pacemaker remains in appropriate position.

Pulmonary emphysema again noted. Increased atelectasis or
consolidation is seen in the retrocardiac left lower lobe since
prior study. Small bilateral pleural effusions show no significant
change. Diffuse interstitial infiltrates are also stable, consistent
with diffuse interstitial edema.
IMPRESSION: Increased left basilar atelectasis versus consolidation.

Stable cardiomegaly, diffuse interstitial infiltrates, and small
bilateral pleural effusions, consistent with congestive heart
failure.

## 2021-07-06 IMAGING — CT CT CERVICAL SPINE W/O CM
4 series · 16 of 36 positions shown, 18 images · non-contrast
Comparison: Head CT dated 09/24/2015.

CLINICAL DATA: 73-year-old male with head trauma.

EXAM:
CT HEAD WITHOUT CONTRAST
CT CERVICAL SPINE WITHOUT CONTRAST
TECHNIQUE: Multidetector CT imaging of the head and cervical spine was
performed following the standard protocol without intravenous
contrast. Multiplanar CT image reconstructions of the cervical spine
were also generated.

[Series 5: c spine soft · axial · 0.44mm/px · z∈[-307,-267]mm · 2 of 121 slices shown]
[im 21/121  soft-tissue]
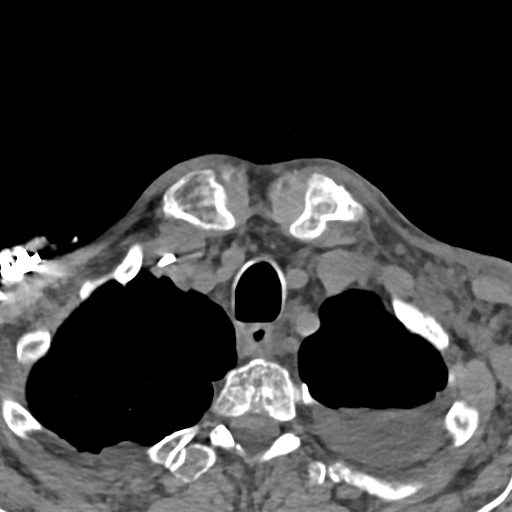
[im 41/121  soft-tissue]
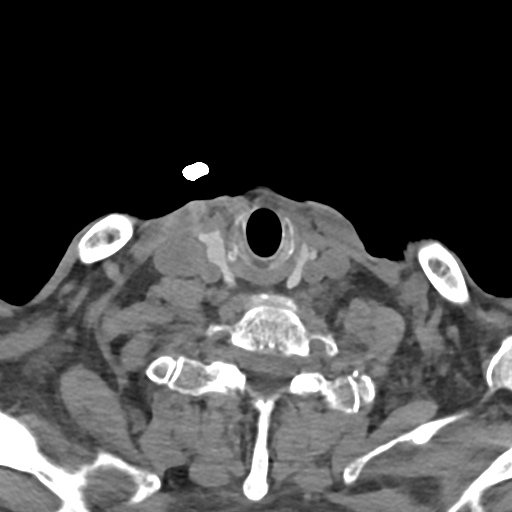

[Series 8: sag bone · sagittal · 0.53mm/px · 6 of 104 slices shown]
[im 35/104  bone]
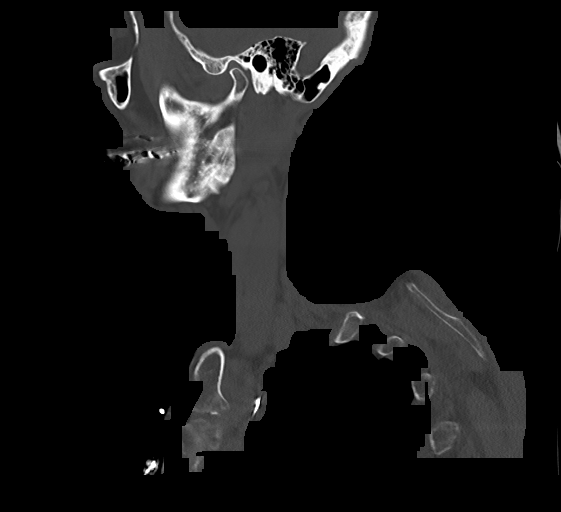
[im 43/104  bone]
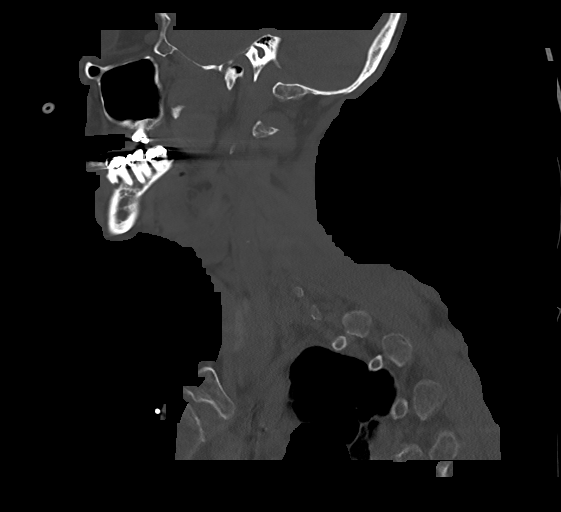
[im 52/104  bone]
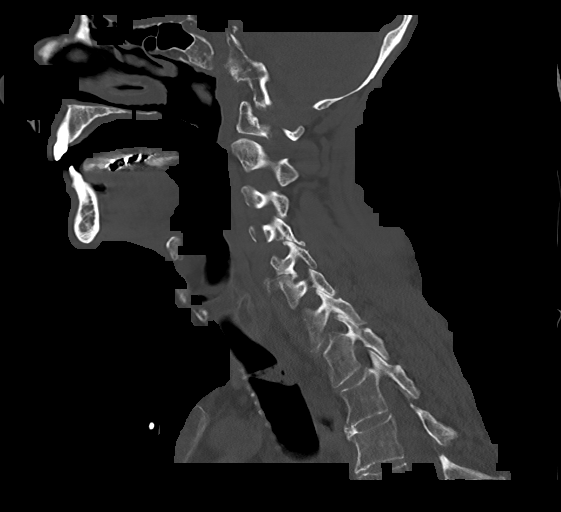
[im 55/104  soft-tissue]
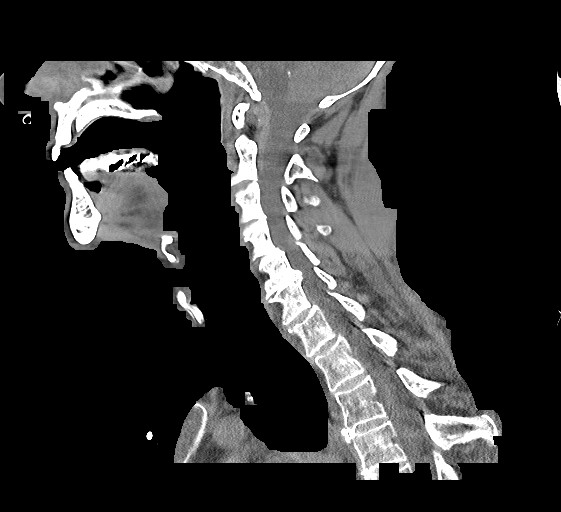
[im 61/104  bone]
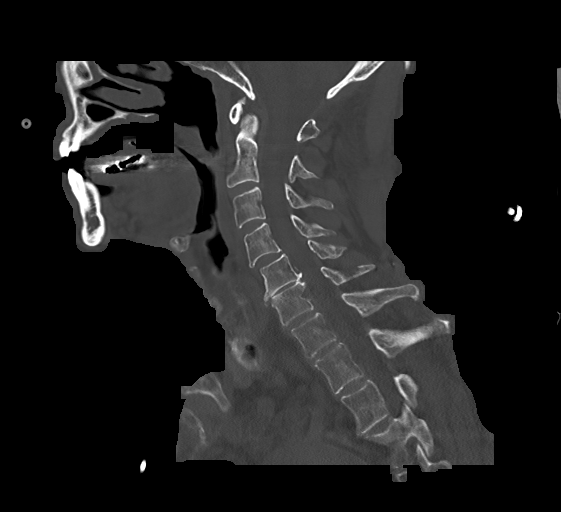
[im 69/104  bone]
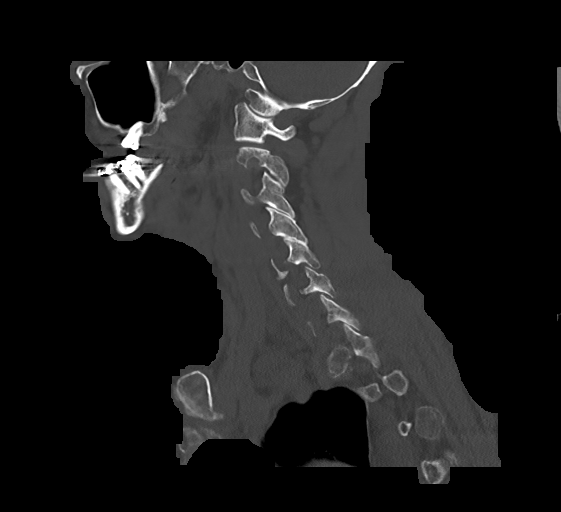

[Series 9: cor bone · coronal · 0.50mm/px · 3 of 142 slices shown]
[im 29/142  bone]
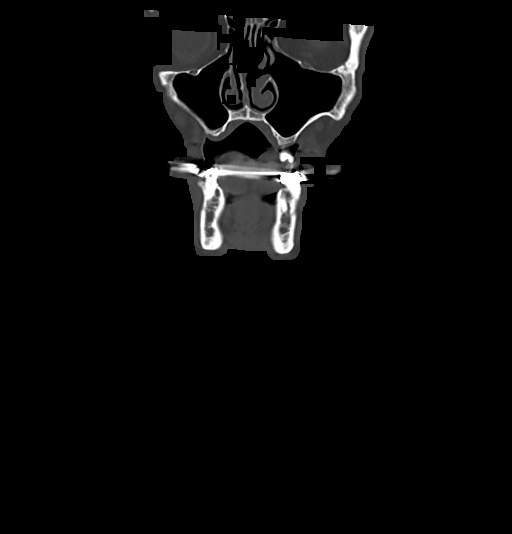
[im 57/142  bone]
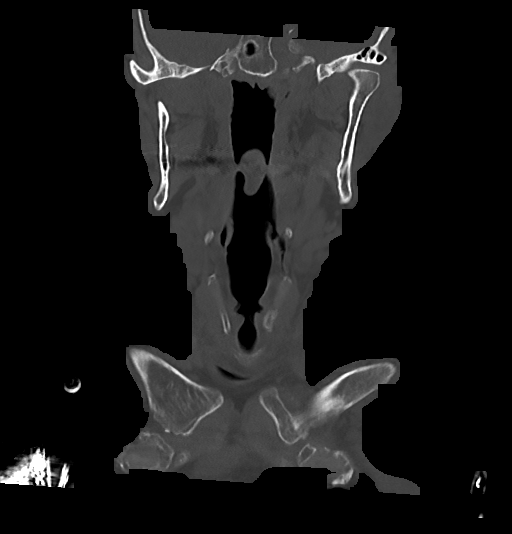
[im 85/142  bone]
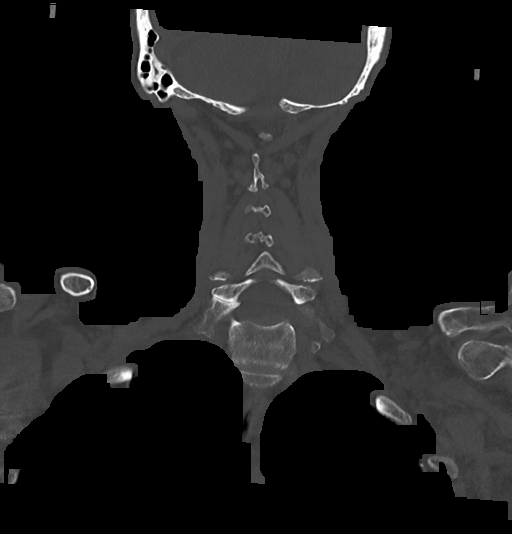

[Series 10: orthogonal axials · axial · 0.21mm/px · z∈[-315,-181]mm · 5 of 115 slices shown, 7 images]
[im 20/115  soft-tissue]
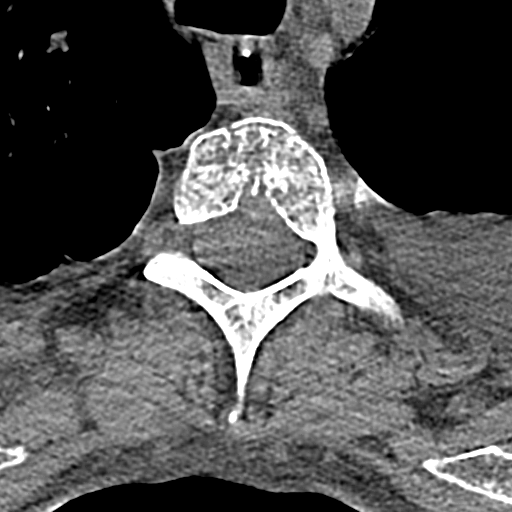
[im 20/115  bone]
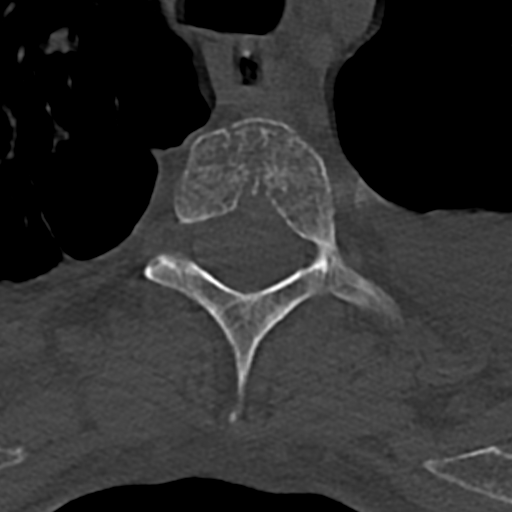
[im 39/115  bone]
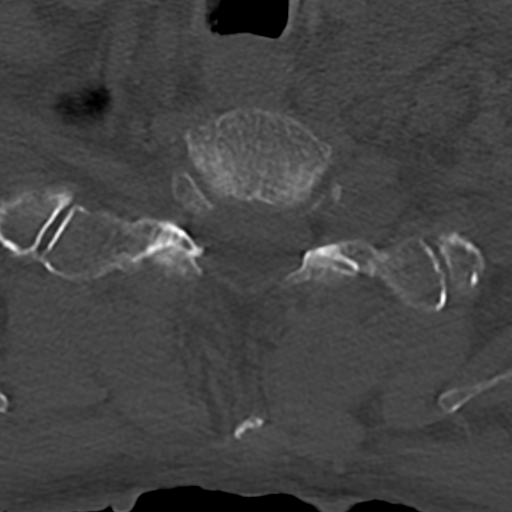
[im 58/115  bone]
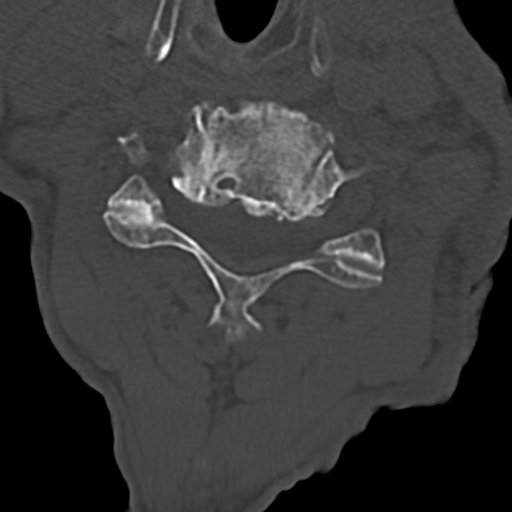
[im 77/115  bone]
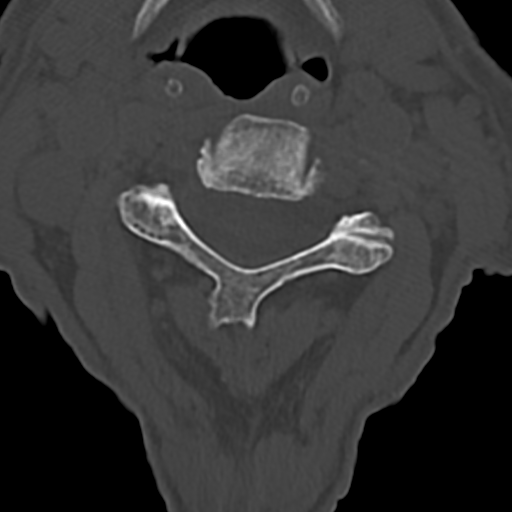
[im 96/115  soft-tissue]
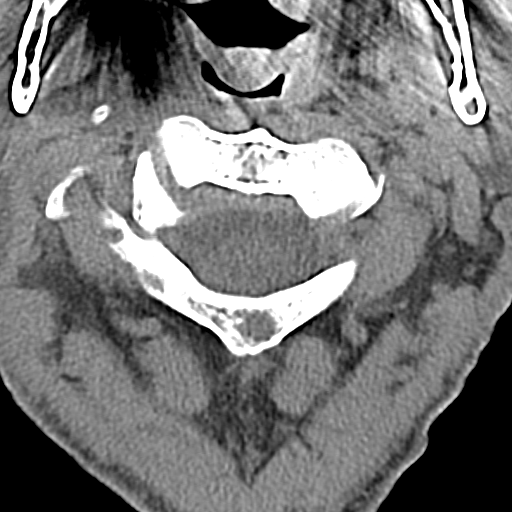
[im 96/115  bone]
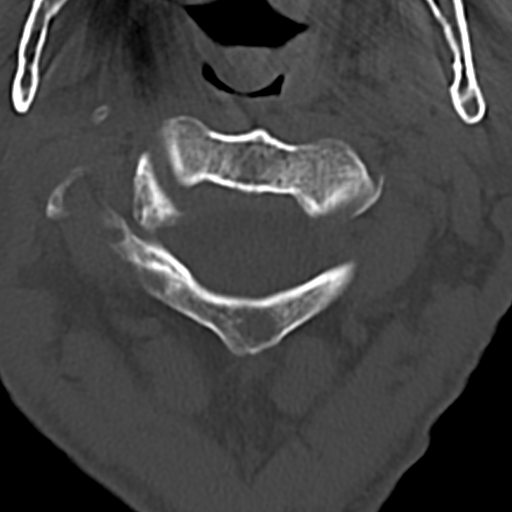

[16 of 36 positions shown; findings below may reference images not displayed]

FINDINGS: CT HEAD FINDINGS

Brain: Moderate age-related atrophy and chronic microvascular
ischemic changes. Left temporal old infarct. There is no acute
intracranial hemorrhage. No mass effect midline shift. No
extra-axial fluid collection.

Vascular: No hyperdense vessel or unexpected calcification.

Skull: Normal. Negative for fracture or focal lesion.

Sinuses/Orbits: No acute finding.

Other: None

CT CERVICAL SPINE FINDINGS

Alignment: No acute subluxation.

Skull base and vertebrae: No acute fracture. Osteopenia.

Soft tissues and spinal canal: No prevertebral fluid or swelling. No
visible canal hematoma.

Disc levels:  Multilevel degenerative changes.

Upper chest: Emphysema. Partially visualized moderate to large left
pleural effusion or hemothorax. Lobulated right pleural effusion or
thickening. Increase in the size of the pleural effusions compared
to the CT of 11/07/2019.

Other: Fracture of the left clavicular head, acute or possibly
subacute new since the prior CT of 11/07/2019.
IMPRESSION: 1. No acute intracranial hemorrhage. Age-related atrophy and chronic
microvascular ischemic changes and old left temporal infarct.
2.  acute/traumatic cervical spine pathology.
3. Fracture of the left clavicular head, acute or possibly subacute
new since the prior CT of 11/07/2019.
[DATE]. Partially visualized moderate to large left pleural effusion or
hemothorax. Increase in the size of the pleural effusions compared
to the CT of 11/07/2019.

## 2021-07-06 IMAGING — CT CT ANGIO CHEST-ABD-PELV FOR DISSECTION W/ AND WO/W CM
2 of 7 series · 10 of 36 positions shown, 15 images · IV contrast (APPLIED)
Comparison: 11/07/2019

CLINICAL DATA: Chest pain.  Concern for aortic dissection.

EXAM:
CT ANGIOGRAPHY CHEST, ABDOMEN AND PELVIS
TECHNIQUE: Non-contrast CT of the chest was initially obtained.

[Series 6: arterial · axial · arterial · 0.93mm/px · z∈[-932,-302]mm · 9 of 379 slices shown, 13 images]
[im 32/379  mediastinal]
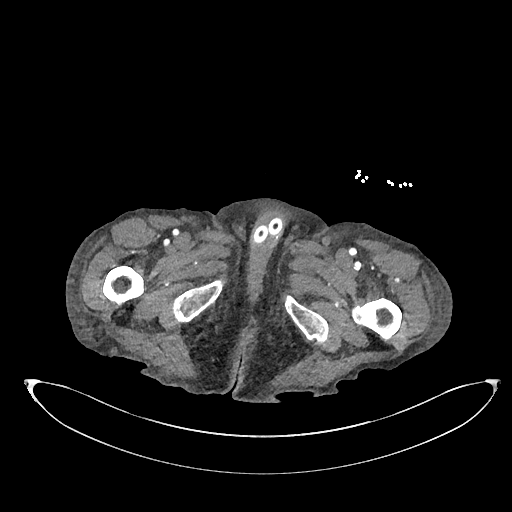
[im 32/379  bone]
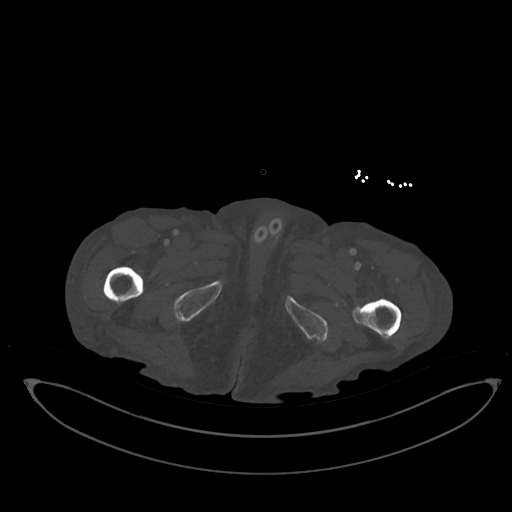
[im 95/379  mediastinal]
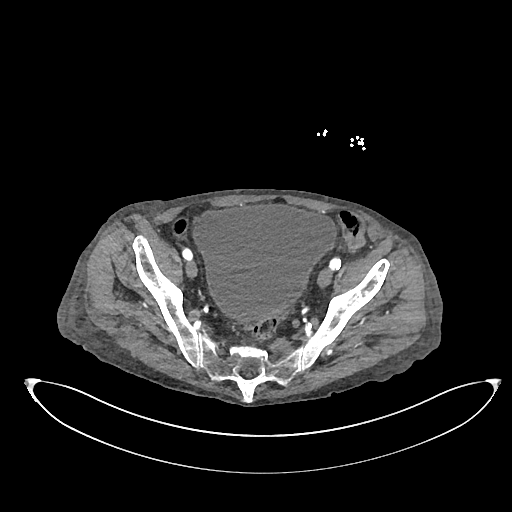
[im 127/379  mediastinal]
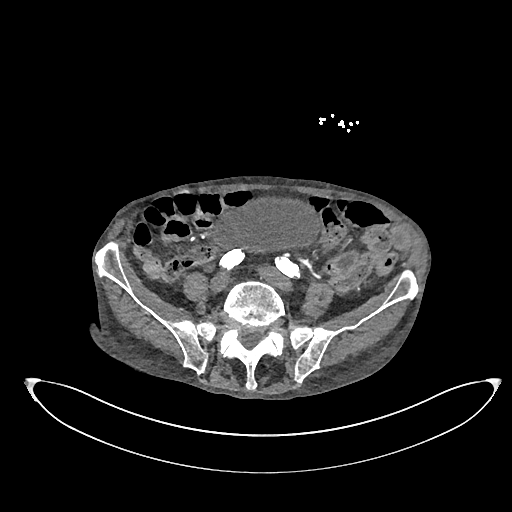
[im 158/379  mediastinal]
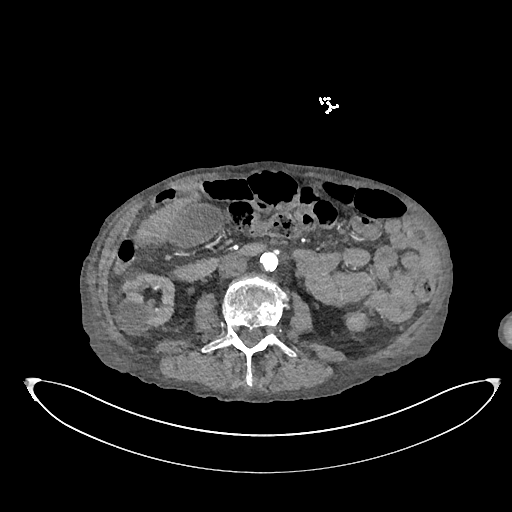
[im 221/379  mediastinal]
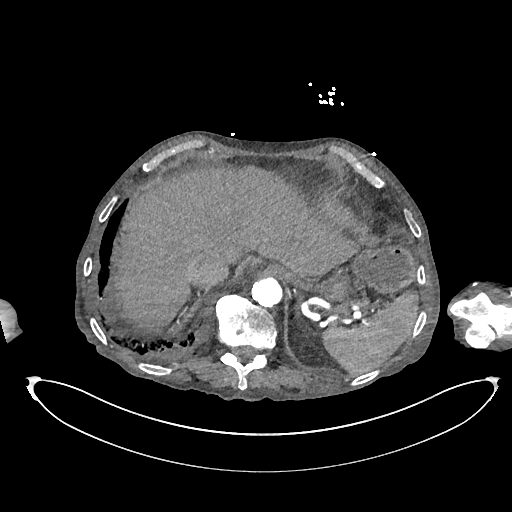
[im 253/379  mediastinal]
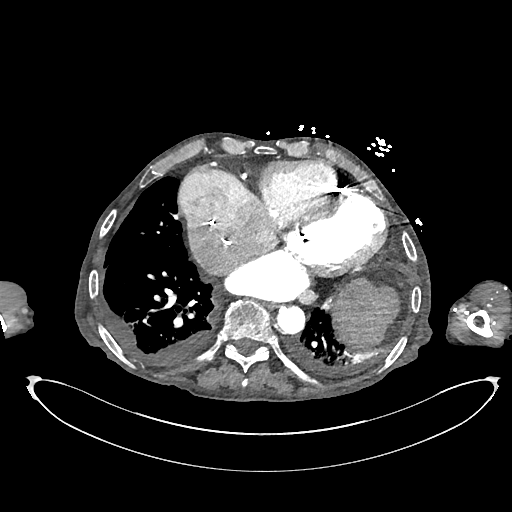
[im 253/379  lung]
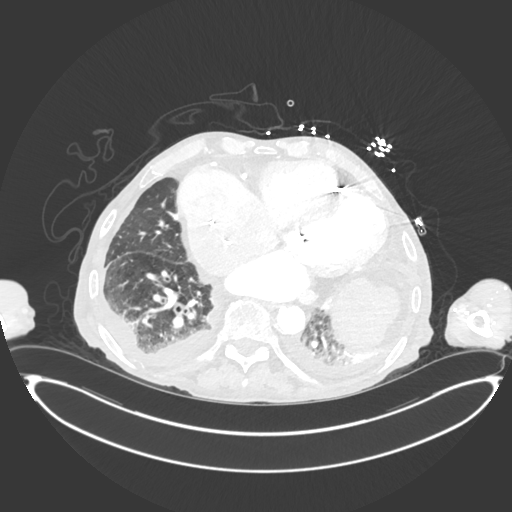
[im 284/379  mediastinal]
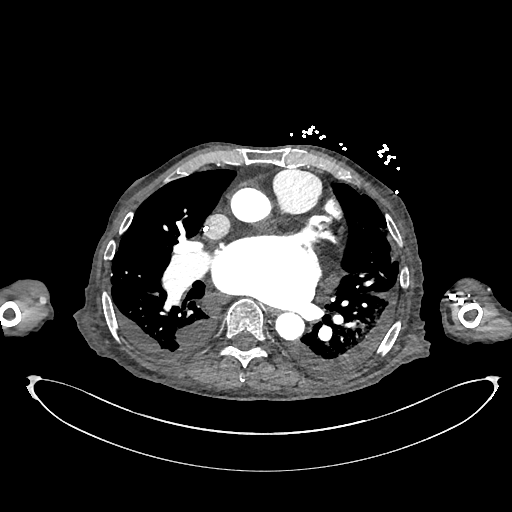
[im 284/379  lung]
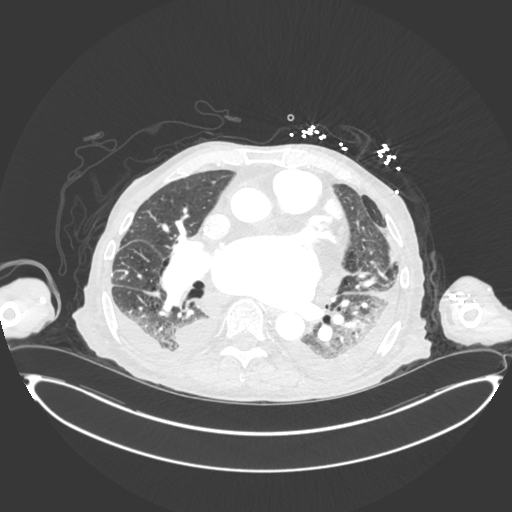
[im 316/379  lung]
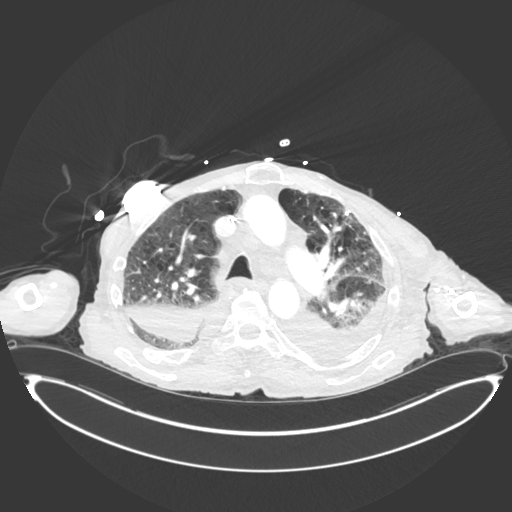
[im 347/379  mediastinal]
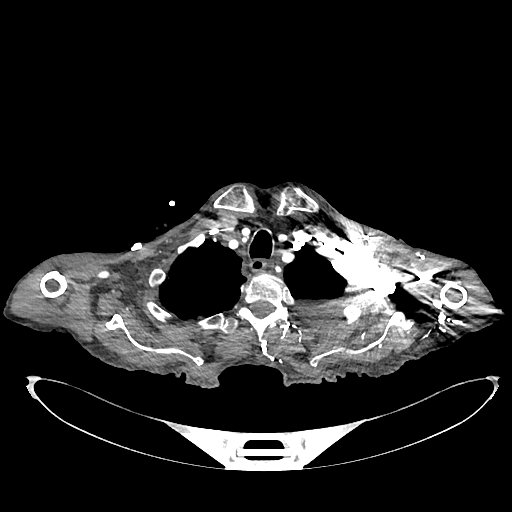
[im 347/379  lung]
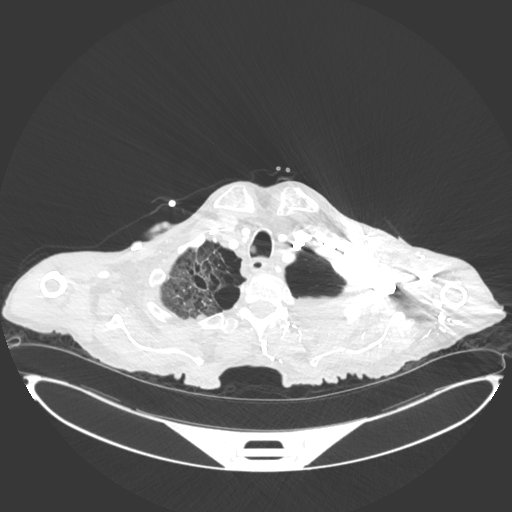

[Series 9: cor · coronal · 1.15mm/px · 1 of 151 slices shown, 2 images]
[im 76/151  mediastinal]
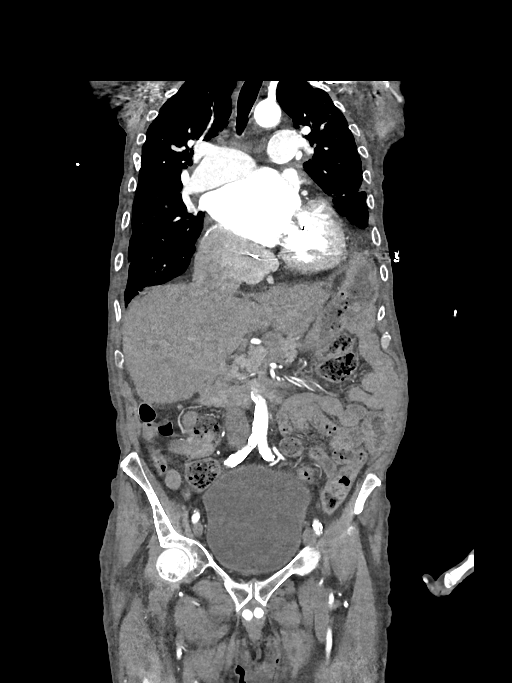
[im 76/151  bone]
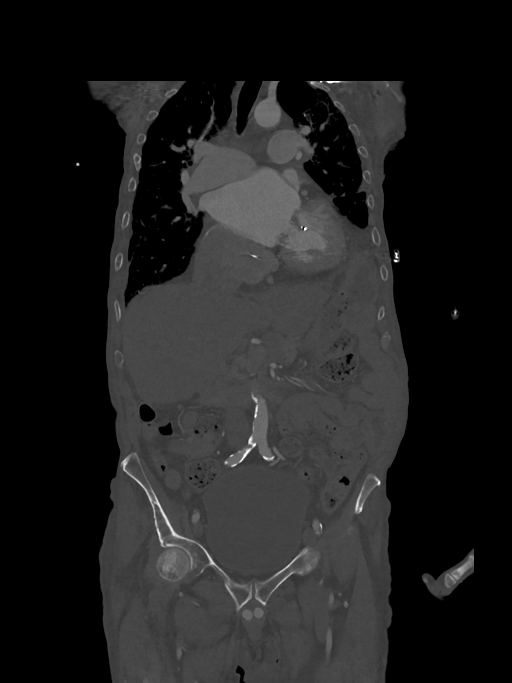

[10 of 36 positions shown; findings below may reference images not displayed]

Multidetector CT imaging through the chest, abdomen and pelvis was
performed using the standard protocol during bolus administration of
intravenous contrast. Multiplanar reconstructed images and MIPs were
obtained and reviewed to evaluate the vascular anatomy.

CONTRAST:  100mL OMNIPAQUE IOHEXOL 350 MG/ML SOLN
FINDINGS: CTA CHEST FINDINGS

Cardiovascular: There is significant cardiac enlargement. The main
pulmonary artery is dilated without evidence for an acute pulmonary
embolism. There is no evidence for thoracic aortic dissection.
Coronary artery calcifications are noted. The arch vessels are
grossly patent. Again noted is a sinus of Valsalva aneurysm
measuring approximately 6.9 cm (sagittal series 10, image 117). This
is essentially unchanged from prior study dated 11/07/2019.

Mediastinum/Nodes:

--there is mild mediastinal adenopathy. This is essentially stable
from prior study.

--No axillary lymphadenopathy.

--No supraclavicular lymphadenopathy.

--Normal thyroid gland.

--The esophagus is unremarkable

Lungs/Pleura: There are moderate severe emphysematous changes
bilaterally. There is interlobular septal thickening. There are
moderate-sized bilateral pleural effusions, left greater than right.
The right-sided pleural effusion appears to be at least partially
loculated. There is bronchial wall thickening and mild mucous
plugging at the lung bases bilaterally. There are postsurgical
changes in the left upper lobe likely related to prior wedge
resection.

Musculoskeletal: No chest wall abnormality. No acute or significant
osseous findings.

Review of the MIP images confirms the above findings.

CTA ABDOMEN AND PELVIS FINDINGS

VASCULAR

Aorta: Normal caliber aorta without aneurysm, dissection, vasculitis
or significant stenosis.

Celiac: There is a mild loop shaped curvature of the celiac axis
with associated narrowing. This can be seen in patients with median
arcuate ligament syndrome.

SMA: There is variant hepatic arterial anatomy with a replaced
common hepatic artery arising from the SMA.

Renals: Multiple bilateral renal arteries are noted, all of which
appear to be grossly patent.

IMA: Patent without evidence of aneurysm, dissection, vasculitis or
significant stenosis.

Inflow: Patent without evidence of aneurysm, dissection, vasculitis
or significant stenosis.

Veins: No obvious venous abnormality within the limitations of this
arterial phase study.

Review of the MIP images confirms the above findings.

NON-VASCULAR

Hepatobiliary: The liver appears cirrhotic. Normal gallbladder.There
is no biliary ductal dilation.

Pancreas: Normal contours without ductal dilatation. No
peripancreatic fluid collection.

Spleen: Unremarkable.

Adrenals/Urinary Tract:

--Adrenal glands: Unremarkable.

--Right kidney/ureter: No hydronephrosis or radiopaque kidney
stones.

--Left kidney/ureter: No hydronephrosis or radiopaque kidney stones.

--Urinary bladder: The urinary bladder is significantly distended.

Stomach/Bowel:

--Stomach/Duodenum: No hiatal hernia or other gastric abnormality.
Normal duodenal course and caliber.

--Small bowel: Unremarkable.

--Colon: There is an masslike appearance of the cecum with multiple
small mildly enlarged regional lymph nodes.

--Appendix: Normal.

Lymphatic:

--No retroperitoneal lymphadenopathy.

--No mesenteric lymphadenopathy.

--No pelvic or inguinal lymphadenopathy.

Reproductive: Prostate gland is enlarged.

Other: There is a mild amount presacral free fluid. There is no free
air. There is mild body wall edema.

Musculoskeletal. Old compression fractures are noted throughout the
lumbar spine with multilevel degenerative changes. These are
essentially stable from prior study.

Review of the MIP images confirms the above findings.
IMPRESSION: 1. No evidence for an acute vascular abnormality. There is no
evidence for a thoracic aortic dissection. No evidence for an acute
pulmonary embolism.
2. Relatively stable appearance of the previously demonstrated sinus
of Valsalva aneurysm.
3. Profound cardiomegaly with findings of volume overload including
small bilateral pleural effusions and body wall edema. The
right-sided pleural effusion appears to be at least partially
loculated.
4. Findings are concerning for a cecal mass as detailed above.
Follow-up with nonemergent colonoscopy is recommended.
5. Significantly distended urinary bladder.
6. Findings suspicious for underlying cirrhosis.
7. Relatively stable mediastinal and hilar adenopathy of unknown
clinical significance.
8. Severe emphysematous changes with findings consistent with
infectious or reactive bronchiolitis at the lung bases.

Aortic Atherosclerosis ([id number].W) and Emphysema ([id number].A).

## 2021-07-06 IMAGING — CR DG THORACIC SPINE 2V
3 series · 3 of 3 positions shown · non-contrast
Comparison: None.

CLINICAL DATA: Fall.  Thoracic back pain.  Initial encounter.

EXAM:
THORACIC SPINE 2 VIEWS

[t-spine ap]
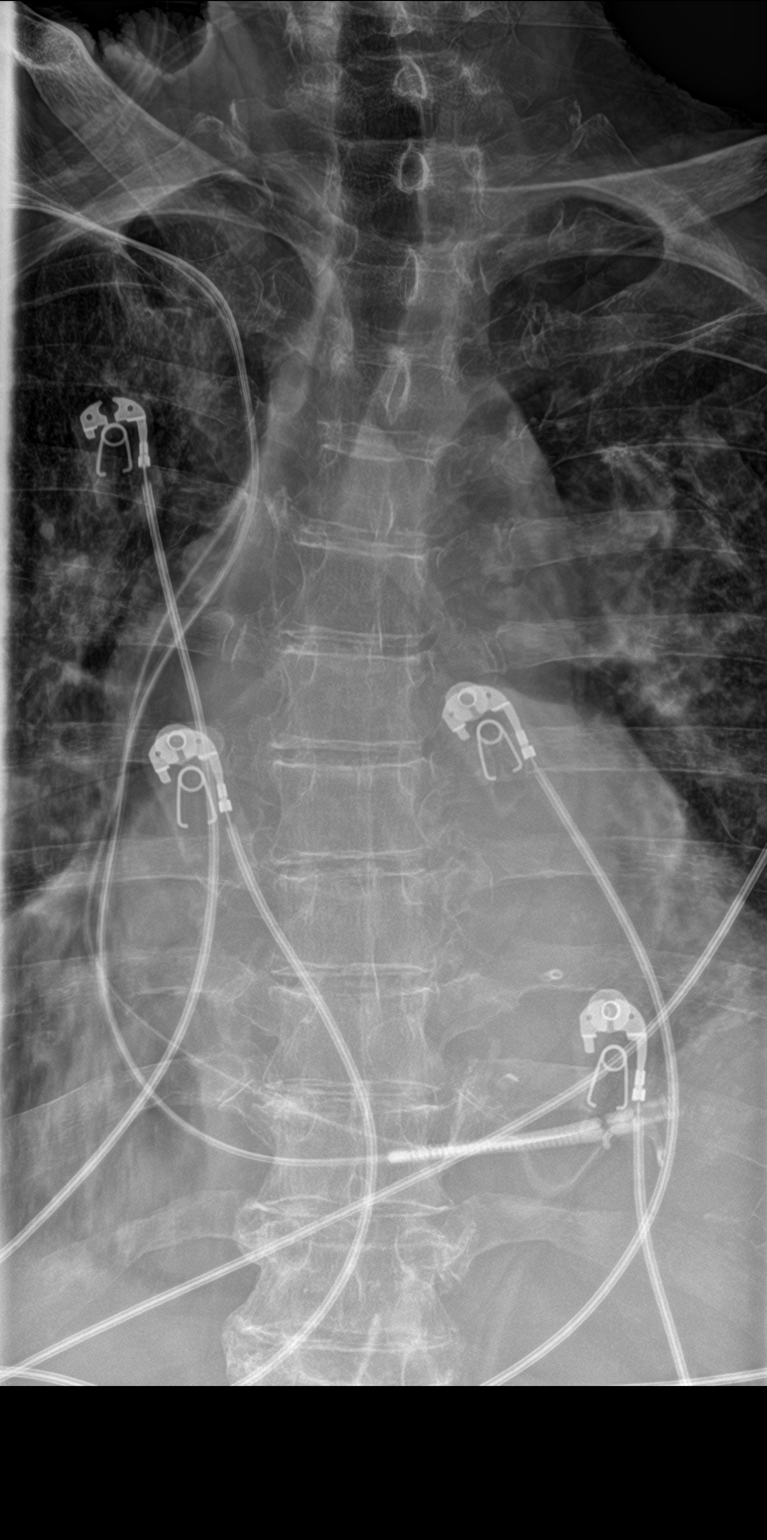

[t-spine lat]
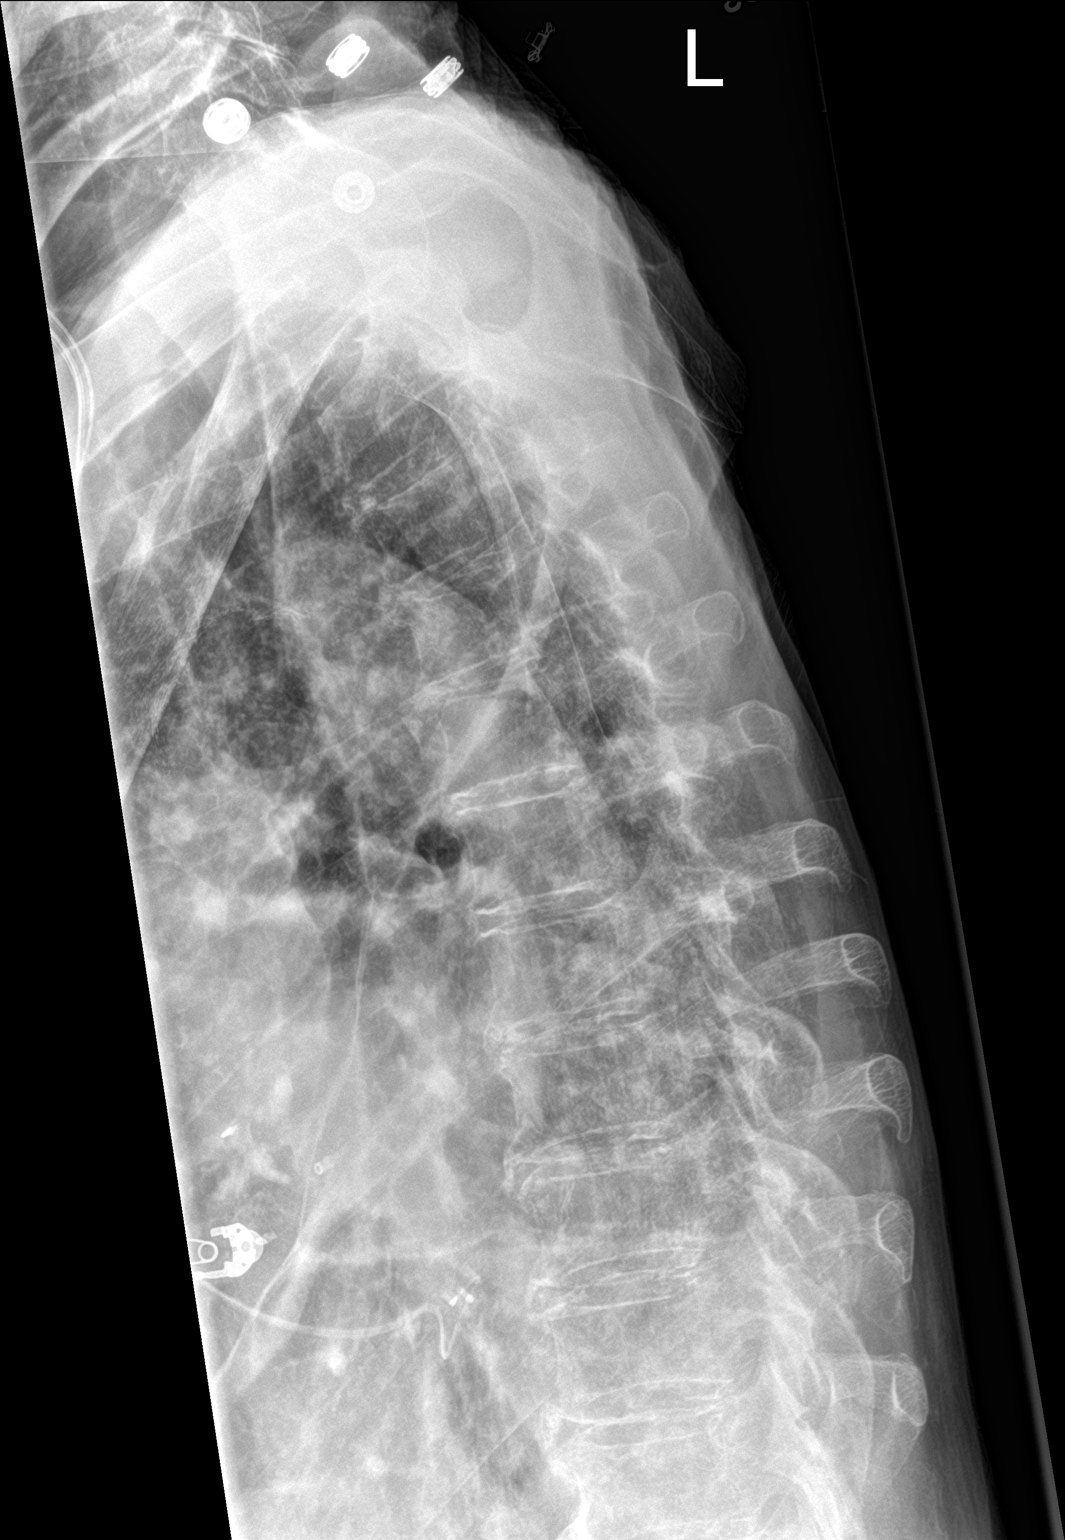

[t-spine swimmers]
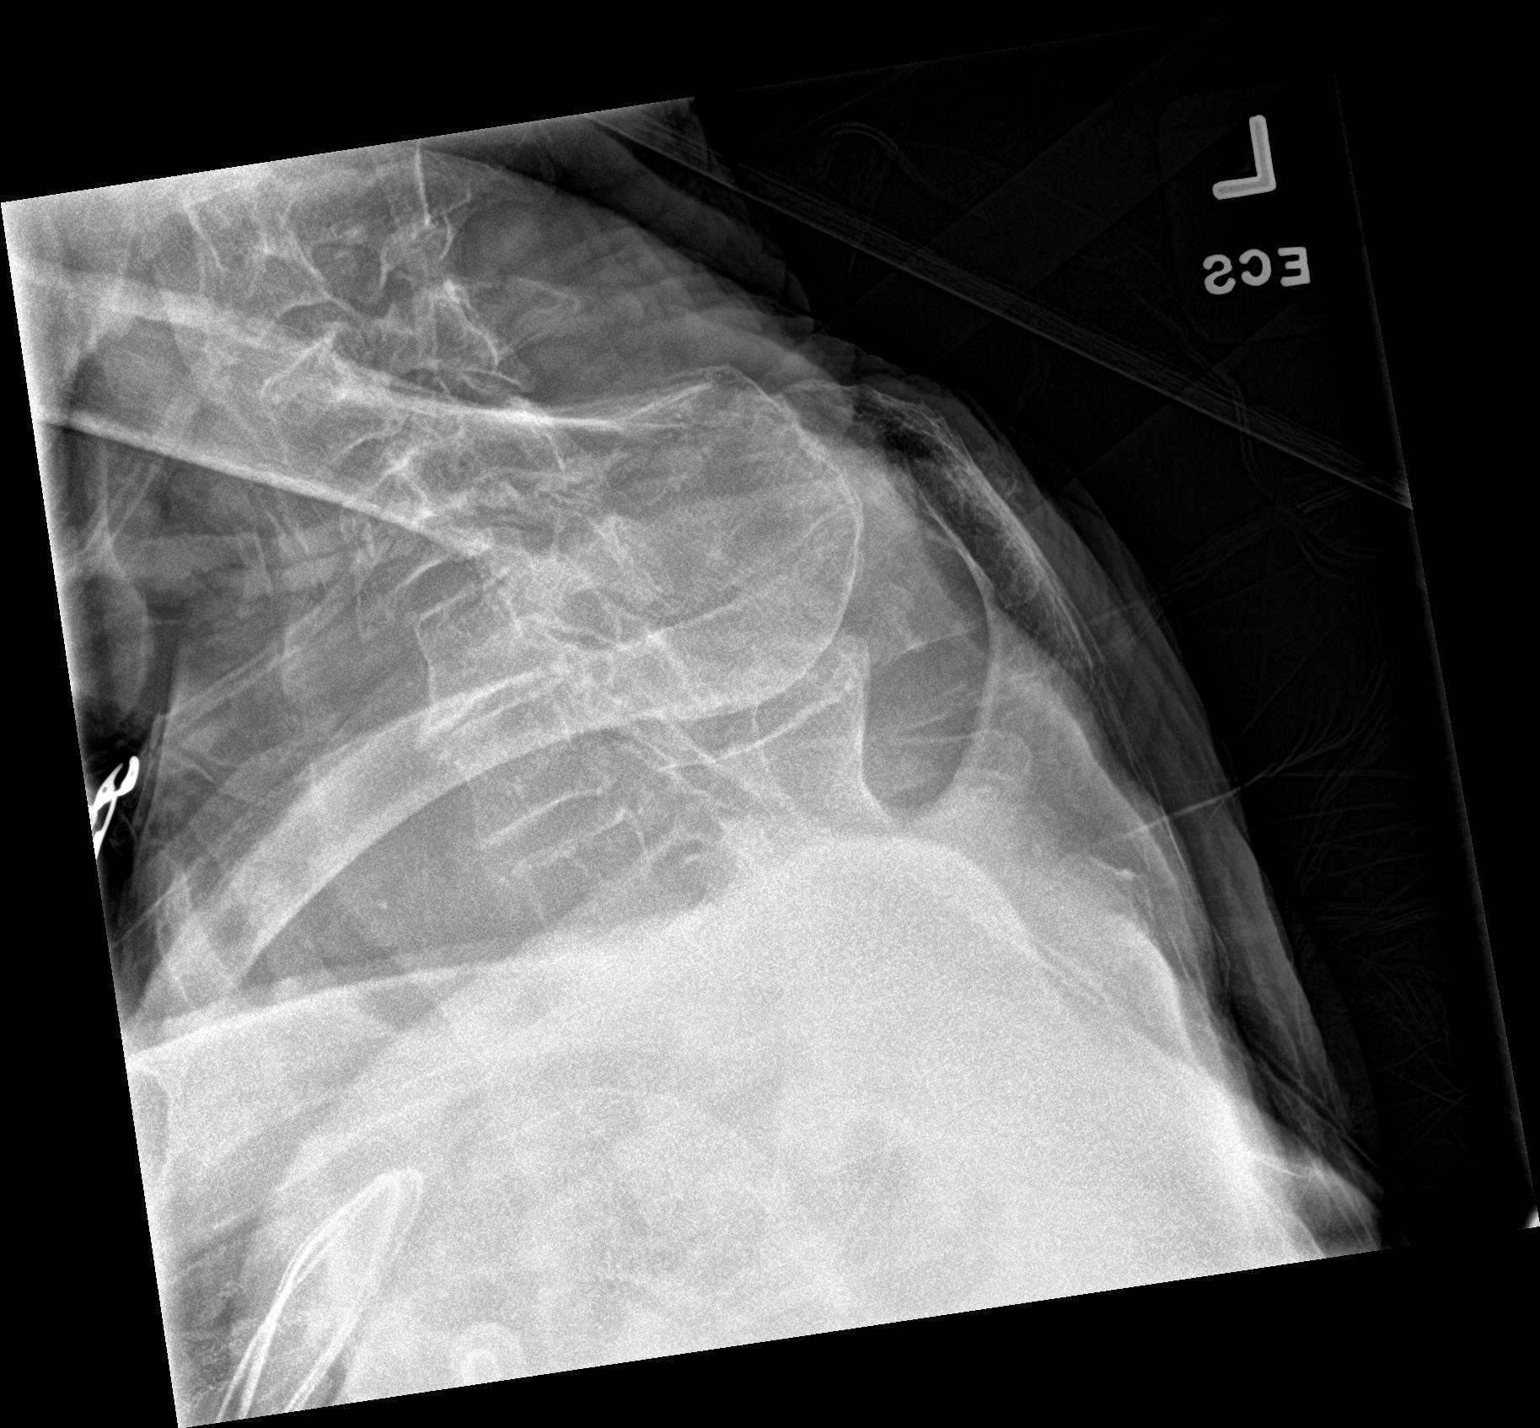

[3 of 3 positions shown; findings below may reference images not displayed]

FINDINGS: There is no evidence of thoracic spine fracture. Alignment is
normal. No focal lytic or sclerotic bone lesions identified. Mild
degenerative disc disease seen in the mid and lower thoracic spine,
and cervical spine. Generalized osteopenia also noted.
IMPRESSION: No acute findings.

Degenerative spondylosis, as described above.  Osteopenia.

## 2021-07-06 IMAGING — CR DG LUMBAR SPINE COMPLETE 4+V
5 series · 5 of 5 positions shown · non-contrast
Comparison: Lumbar spine radiograph dated 11/16/2019.

CLINICAL DATA: 73-year-old male with fall and back pain.

EXAM:
LUMBAR SPINE - COMPLETE 4+ VIEW

[l-spine ap]
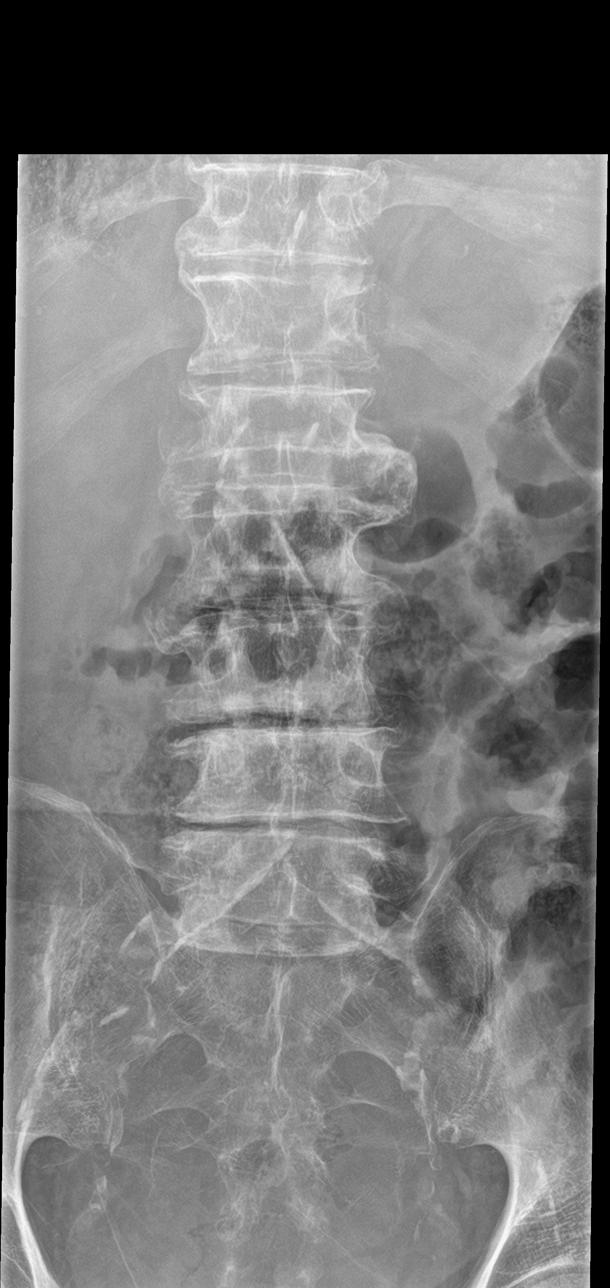

[l-spine obl (1 of 2)]
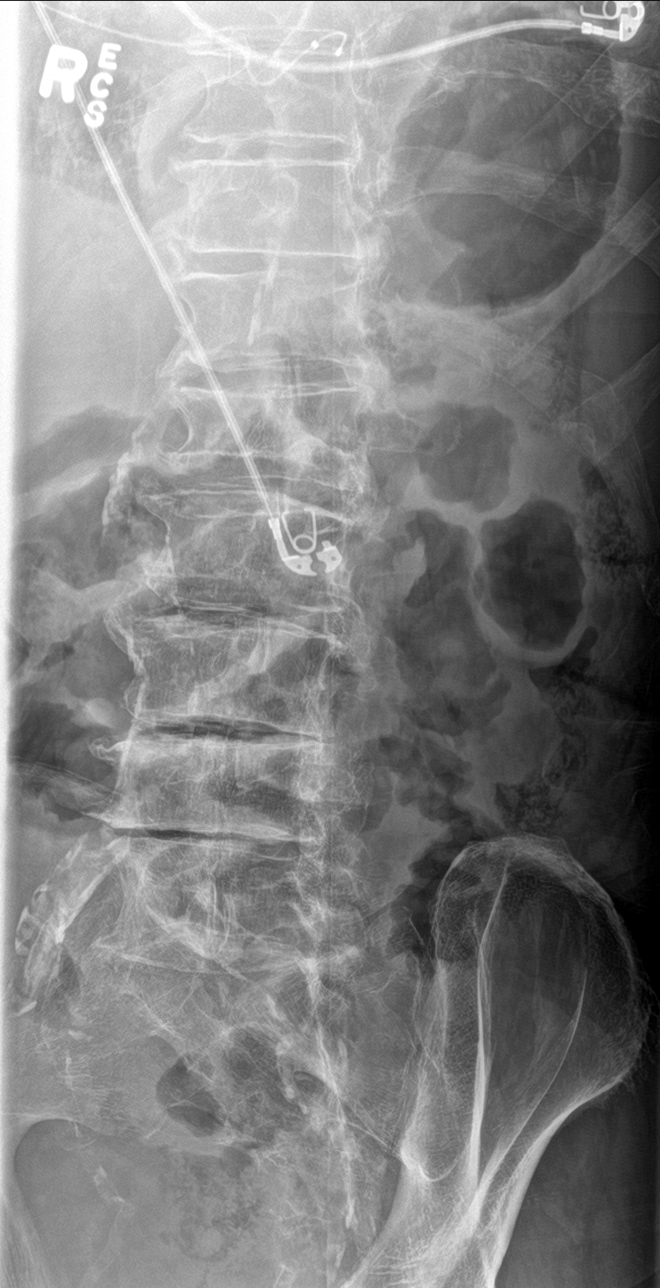

[l-spine obl (2 of 2)]
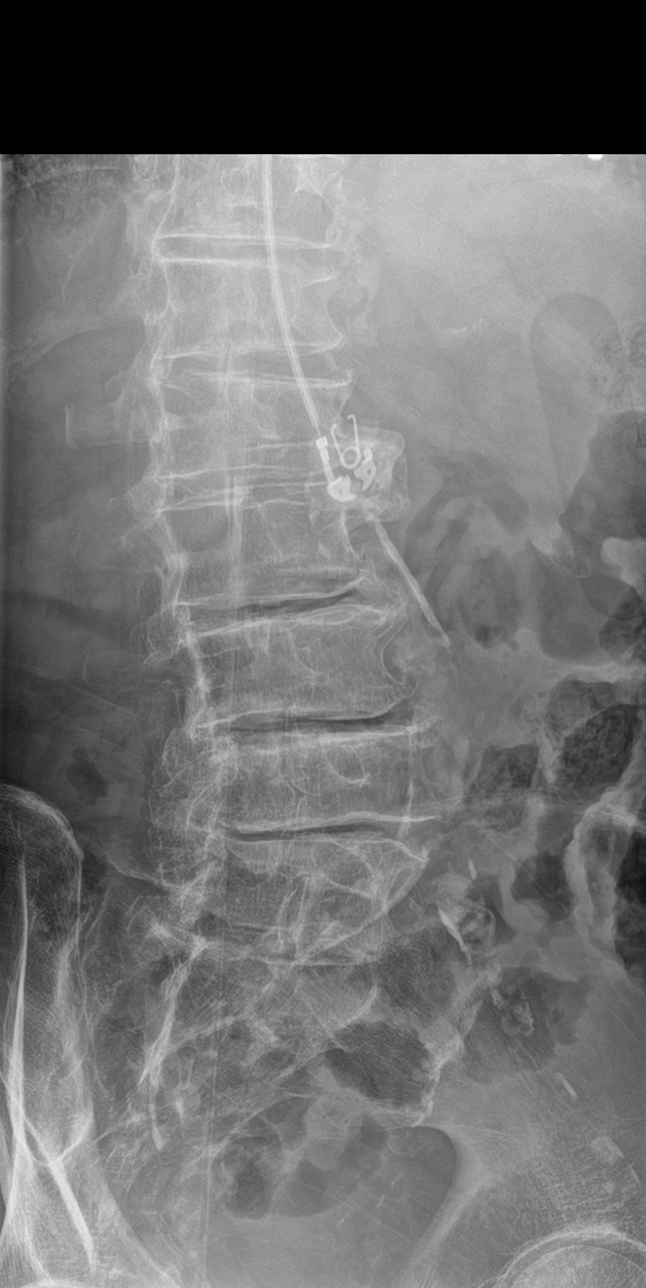

[l-spine lat]
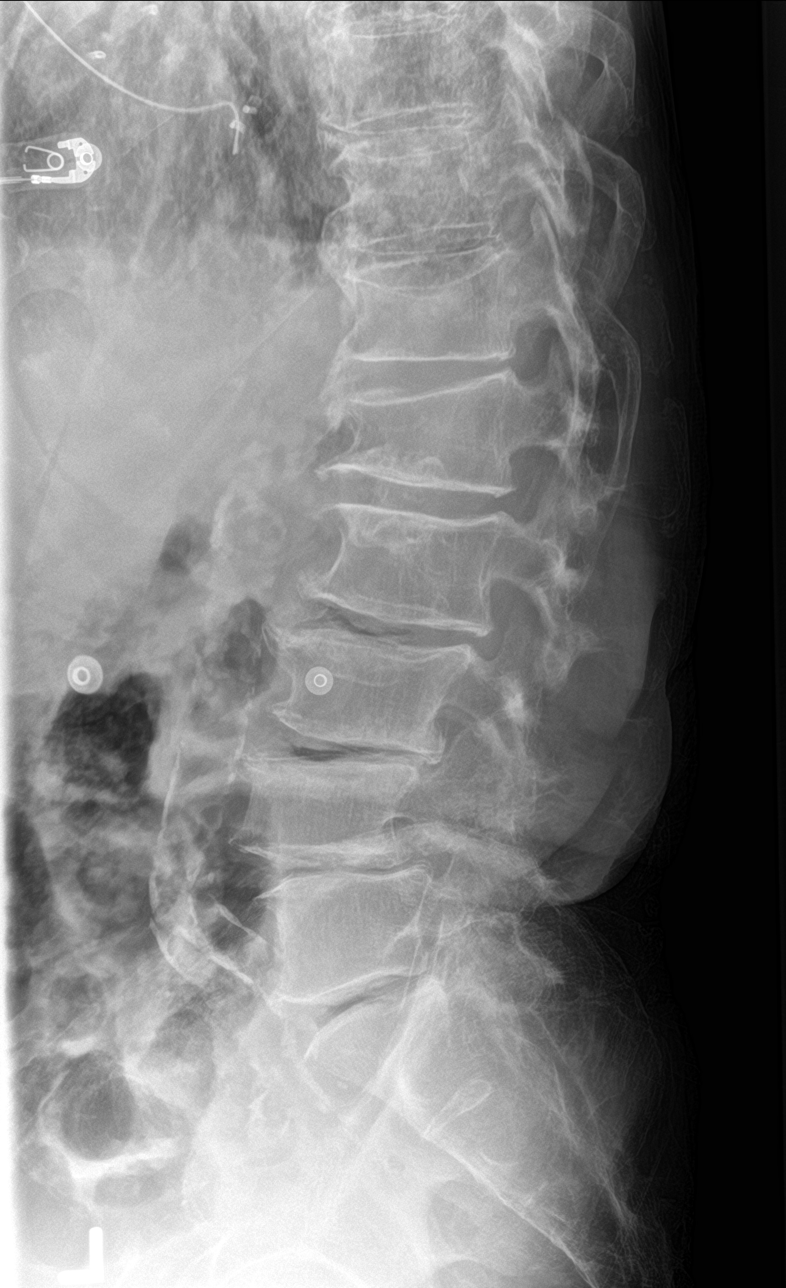

[l-spine spot]
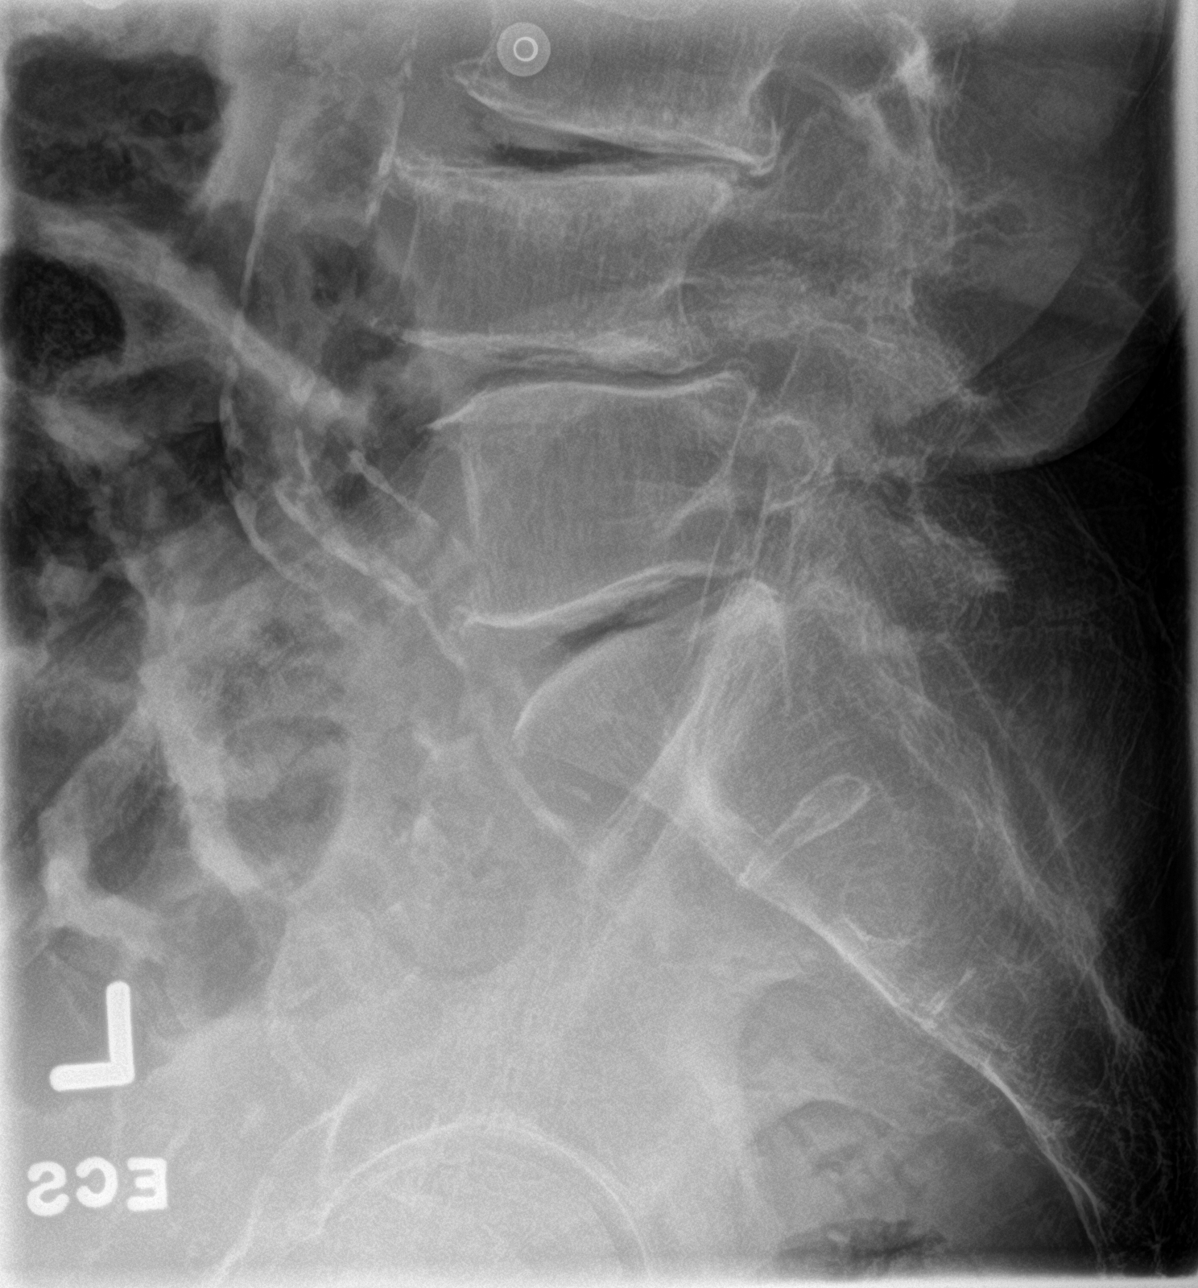

[5 of 5 positions shown; findings below may reference images not displayed]

FINDINGS: Five lumbar type vertebra. There is no acute fracture or subluxation
of the lumbar spine. Multilevel mild chronic compression injury most
prominent involving L1 and L4. Multilevel degenerative changes with
bone spurring. Multilevel disc desiccation and vacuum phenomena.
Grade 1 L3-L4 retrolisthesis and L4-L5 anterolisthesis.
Atherosclerotic calcification of the abdominal aorta.
IMPRESSION: No acute/traumatic lumbar spine pathology.

## 2021-07-06 IMAGING — CT CT HEAD W/O CM
4 series · 15 of 47 positions shown, 17 images · non-contrast
Comparison: Head CT dated 09/24/2015.

CLINICAL DATA: 73-year-old male with head trauma.

EXAM:
CT HEAD WITHOUT CONTRAST
CT CERVICAL SPINE WITHOUT CONTRAST
TECHNIQUE: Multidetector CT imaging of the head and cervical spine was
performed following the standard protocol without intravenous
contrast. Multiplanar CT image reconstructions of the cervical spine
were also generated.

[Series 3: head wo · axial · 0.49mm/px · z∈[-148,-13]mm · 7 of 37 slices shown, 9 images]
[im 5/37  brain]
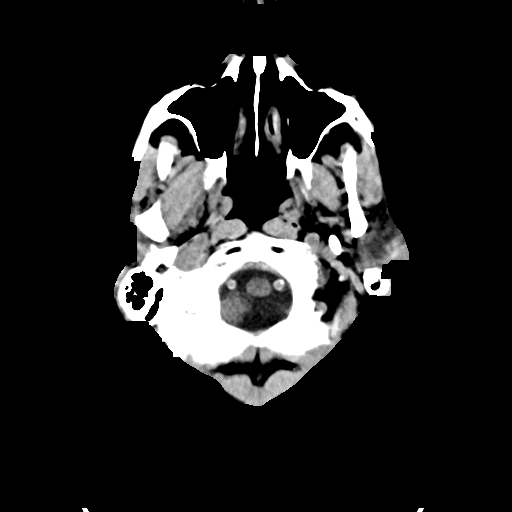
[im 5/37  bone]
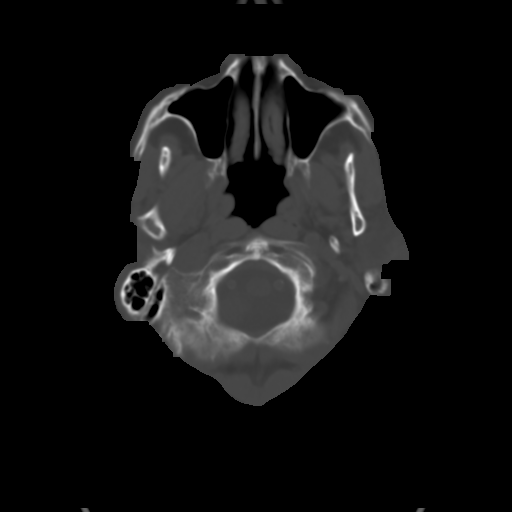
[im 10/37  brain]
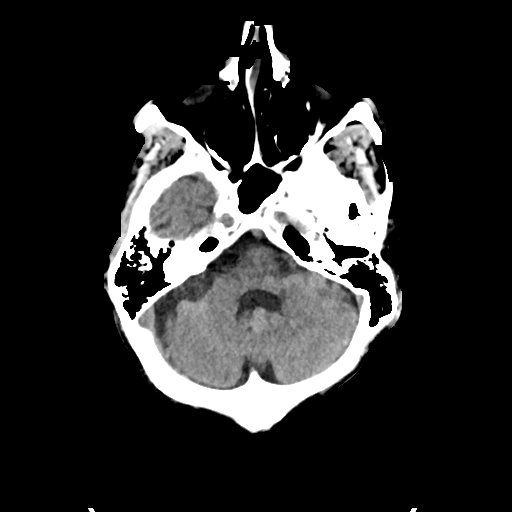
[im 14/37  brain]
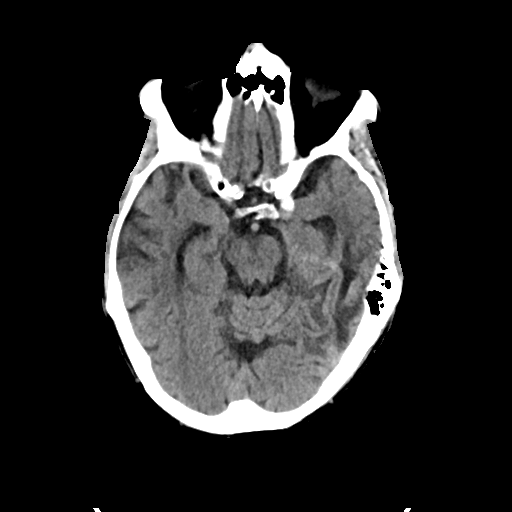
[im 19/37  brain]
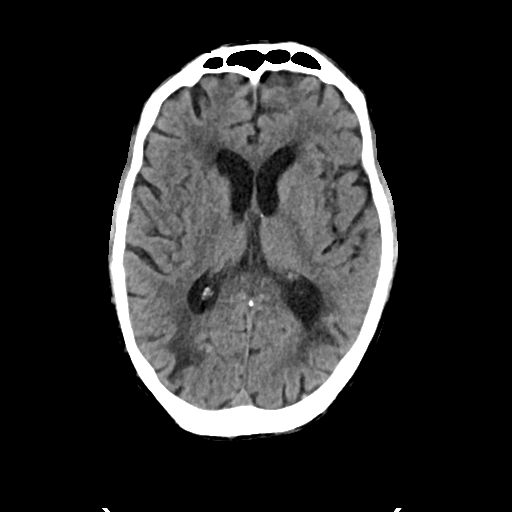
[im 23/37  brain]
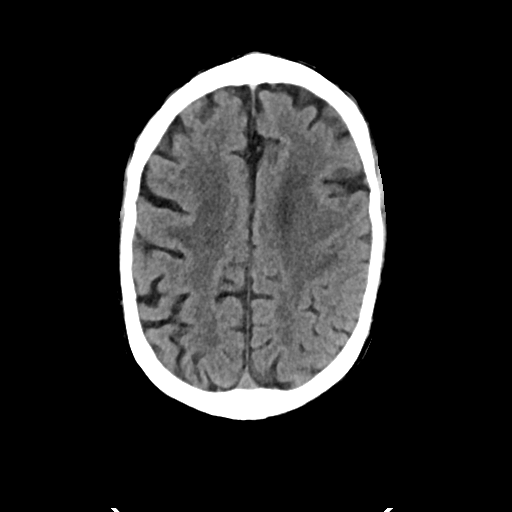
[im 23/37  bone]
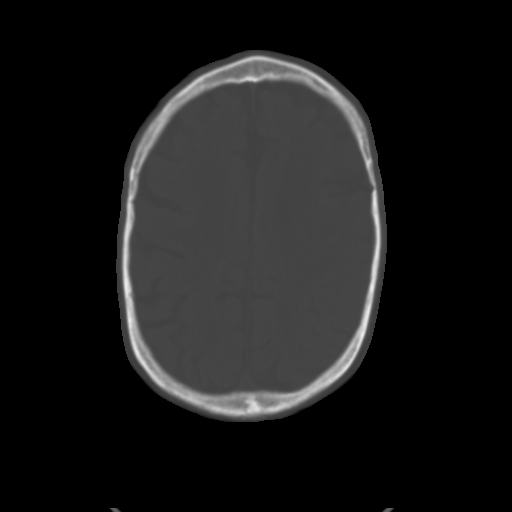
[im 28/37  brain]
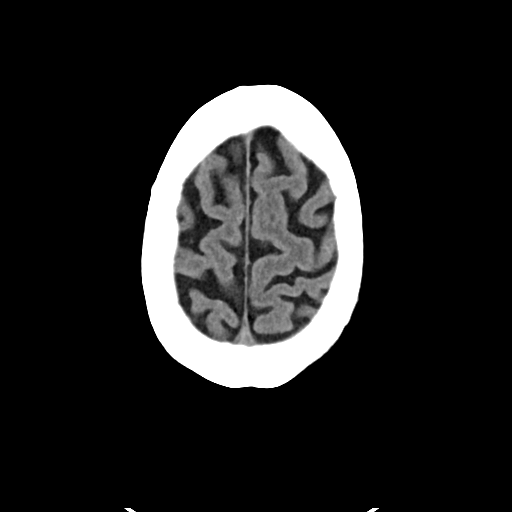
[im 32/37  brain]
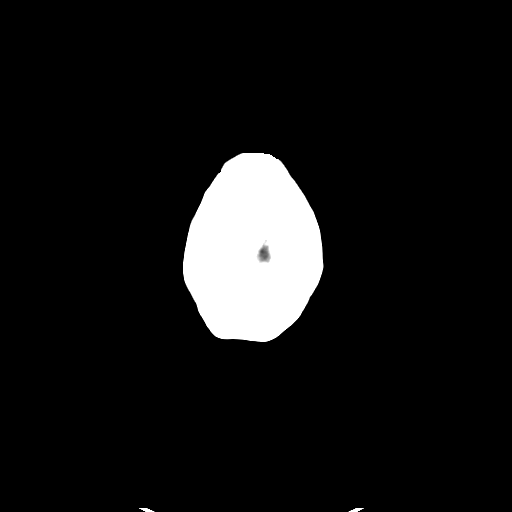

[Series 4: head bone · axial · 0.49mm/px · z∈[-150,-132]mm · 2 of 91 slices shown]
[im 10/91  bone]
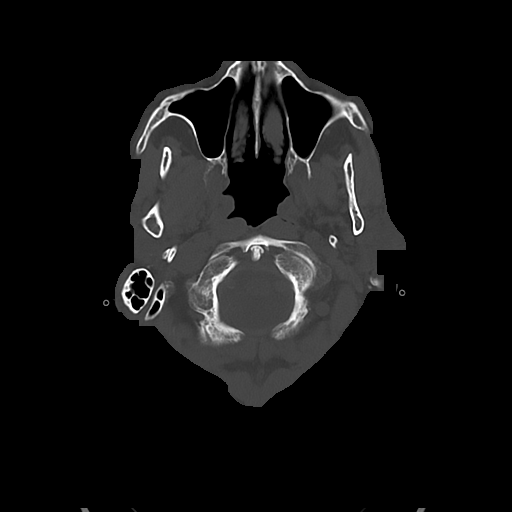
[im 19/91  bone]
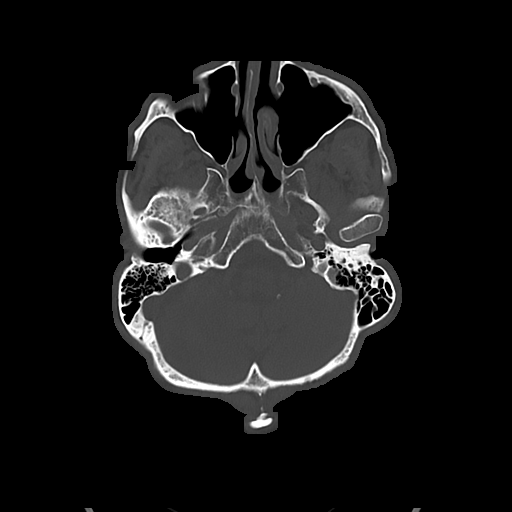

[Series 5: cor soft · coronal · 0.34mm/px · 3 of 78 slices shown]
[im 26/78  brain]
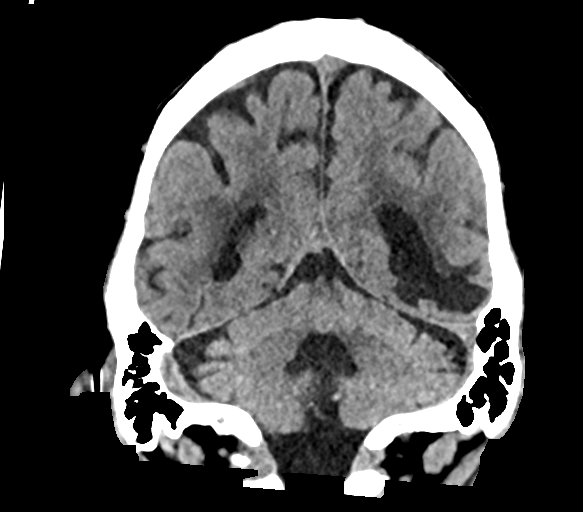
[im 35/78  brain]
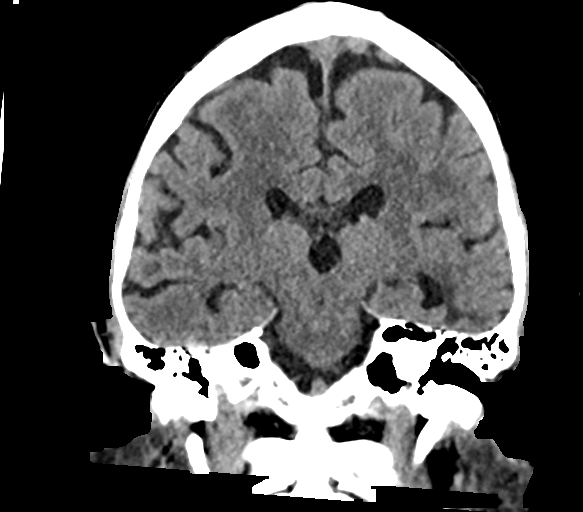
[im 43/78  brain]
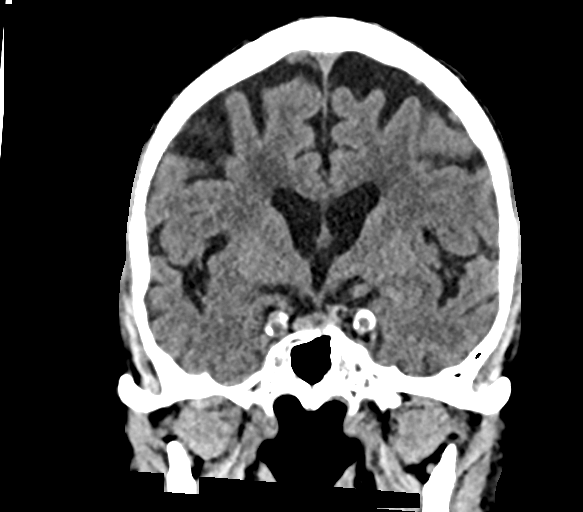

[Series 6: sag soft · sagittal · 0.41mm/px · 3 of 66 slices shown]
[im 22/66  brain]
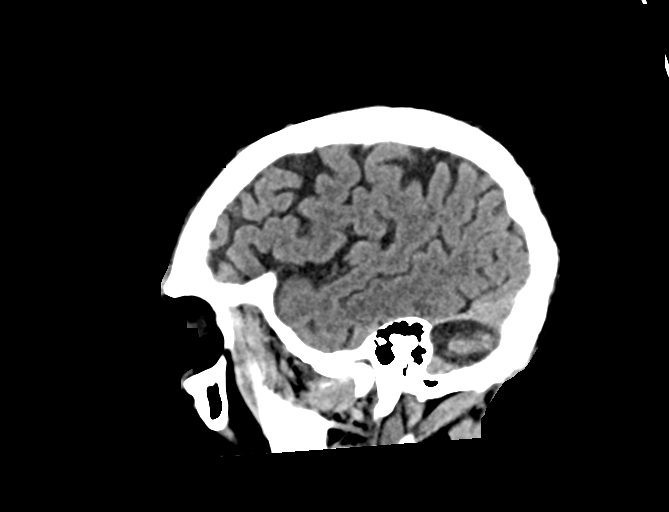
[im 33/66  brain]
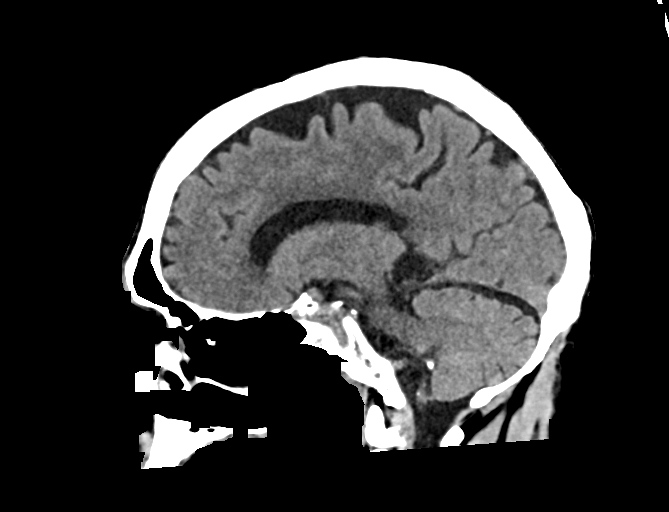
[im 44/66  brain]
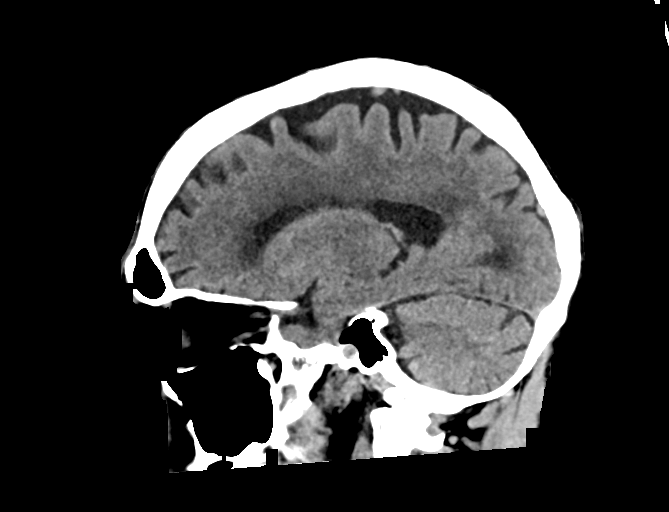

[15 of 47 positions shown; findings below may reference images not displayed]

FINDINGS: CT HEAD FINDINGS

Brain: Moderate age-related atrophy and chronic microvascular
ischemic changes. Left temporal old infarct. There is no acute
intracranial hemorrhage. No mass effect midline shift. No
extra-axial fluid collection.

Vascular: No hyperdense vessel or unexpected calcification.

Skull: Normal. Negative for fracture or focal lesion.

Sinuses/Orbits: No acute finding.

Other: None

CT CERVICAL SPINE FINDINGS

Alignment: No acute subluxation.

Skull base and vertebrae: No acute fracture. Osteopenia.

Soft tissues and spinal canal: No prevertebral fluid or swelling. No
visible canal hematoma.

Disc levels:  Multilevel degenerative changes.

Upper chest: Emphysema. Partially visualized moderate to large left
pleural effusion or hemothorax. Lobulated right pleural effusion or
thickening. Increase in the size of the pleural effusions compared
to the CT of 11/07/2019.

Other: Fracture of the left clavicular head, acute or possibly
subacute new since the prior CT of 11/07/2019.
IMPRESSION: 1. No acute intracranial hemorrhage. Age-related atrophy and chronic
microvascular ischemic changes and old left temporal infarct.
2.  acute/traumatic cervical spine pathology.
3. Fracture of the left clavicular head, acute or possibly subacute
new since the prior CT of 11/07/2019.
[DATE]. Partially visualized moderate to large left pleural effusion or
hemothorax. Increase in the size of the pleural effusions compared
to the CT of 11/07/2019.

## 2021-07-24 IMAGING — DX DG CHEST 1V PORT
2 series · 2 of 2 positions shown · non-contrast
Comparison: December 06, 2019

CLINICAL DATA: Weakness

EXAM:
PORTABLE CHEST 1 VIEW

[chest ap (1 of 2)]
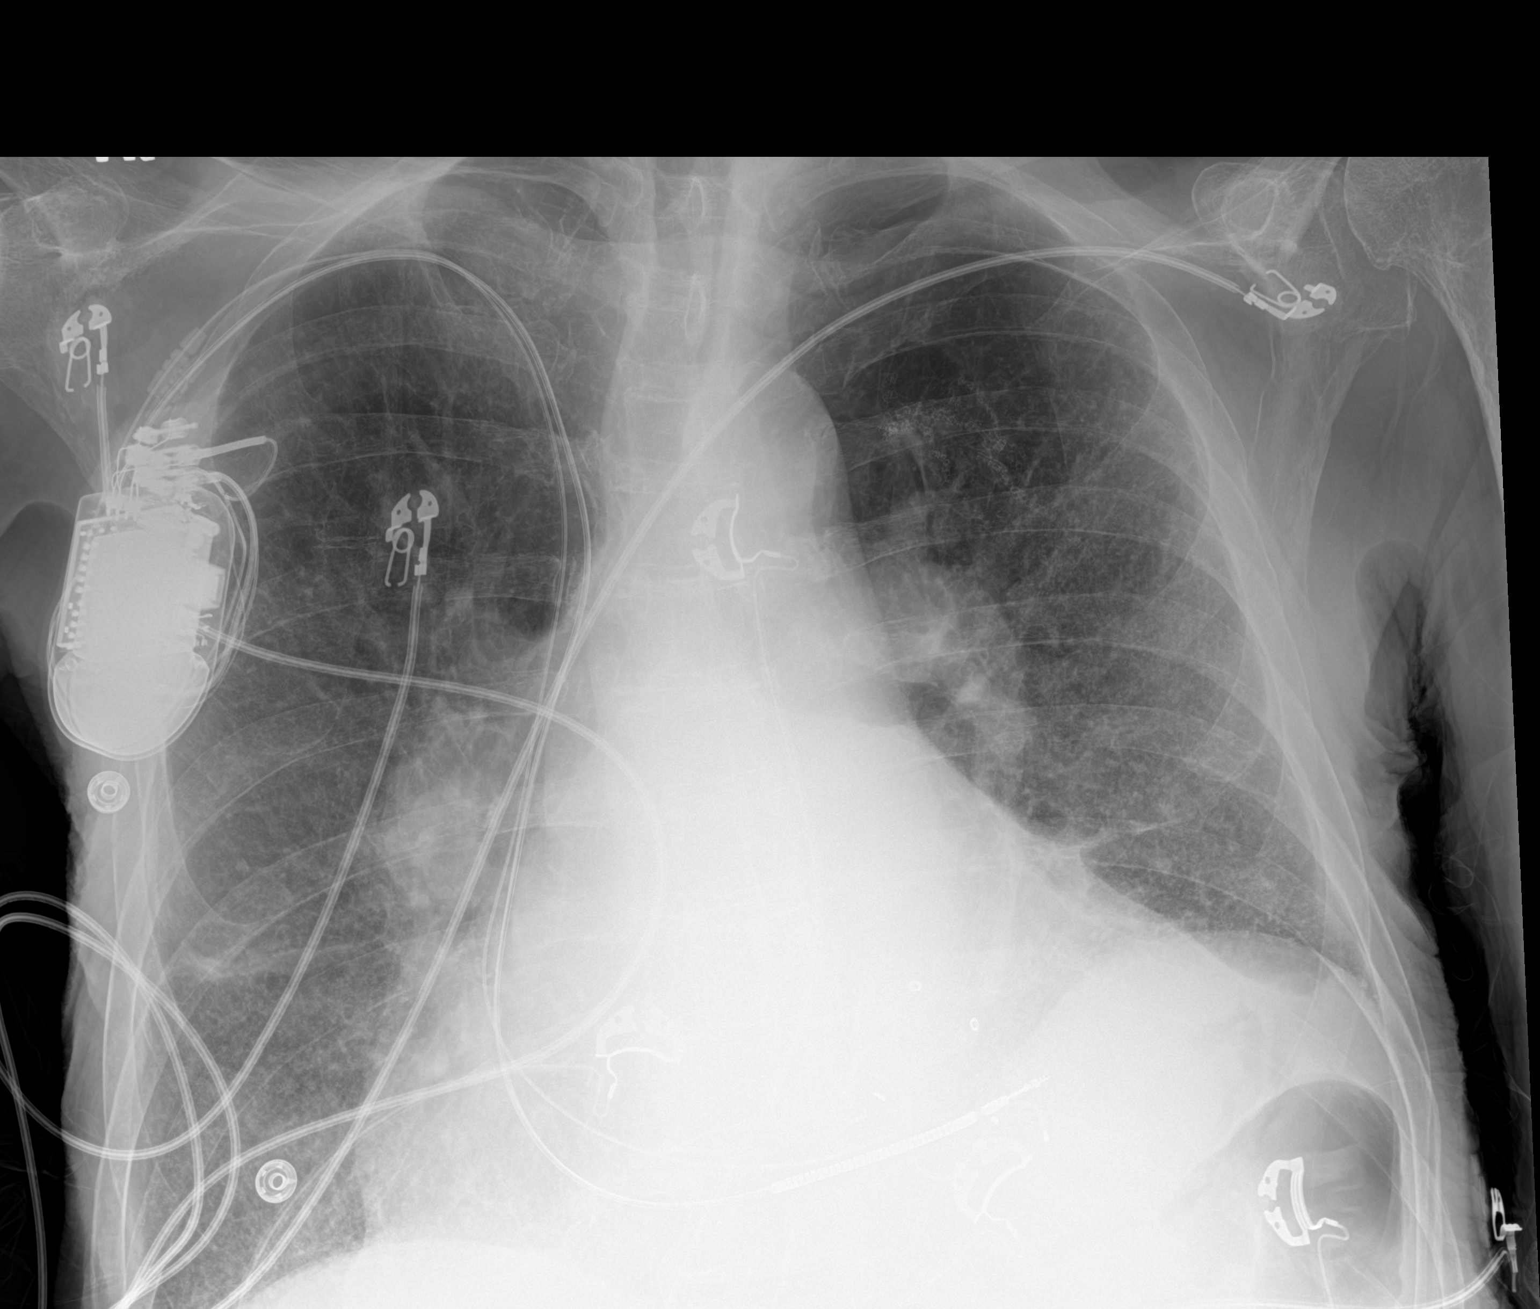

[chest ap (2 of 2)]
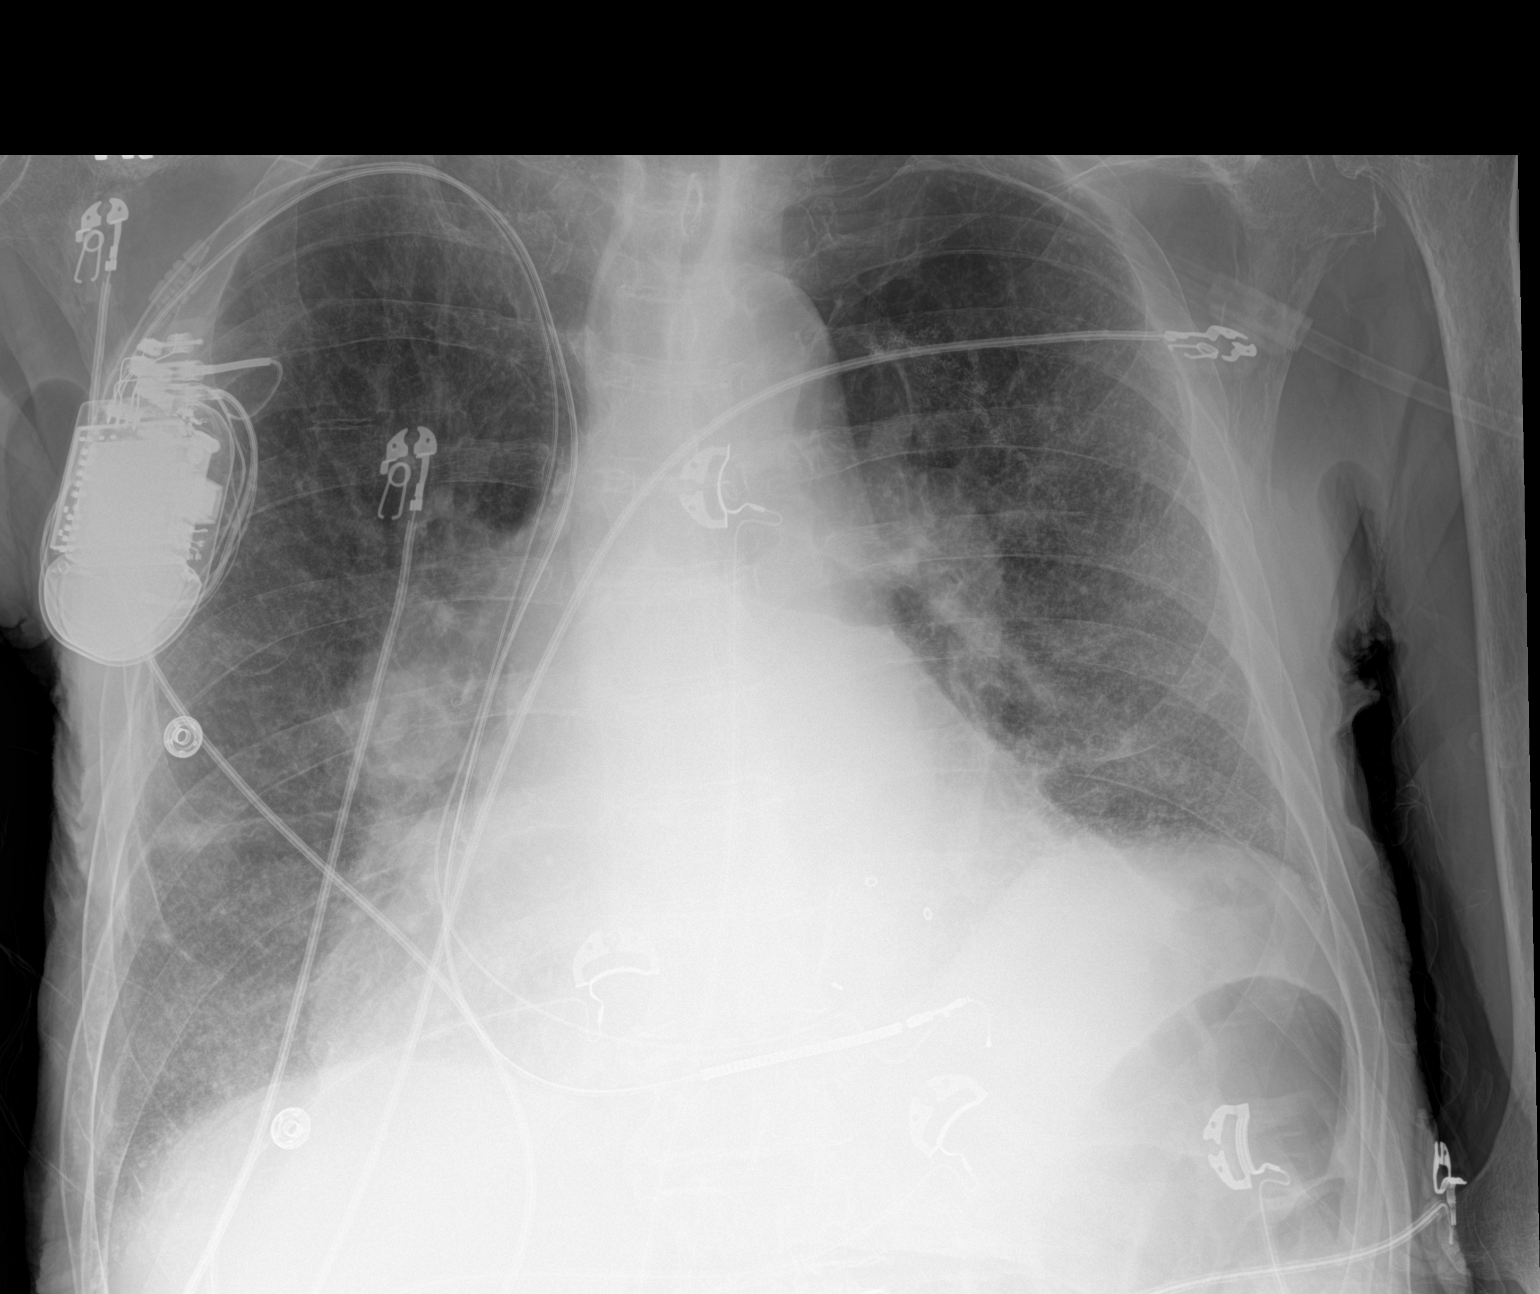

[2 of 2 positions shown; findings below may reference images not displayed]

FINDINGS: The heart size and mediastinal contours are stable. A right-sided
pacemaker seen with the lead tips in the right atrium right
ventricle. Again noted is bilateral hilar adenopathy. Streaky
atelectasis seen at the right lung base. Surgical suture at the left
upper lung. No large airspace consolidation. No acute osseous
abnormality.
IMPRESSION: No active disease.

## 2021-07-24 IMAGING — DX DG ABD PORTABLE 2V
1 series · 1 of 1 positions shown · non-contrast
Comparison: None.

CLINICAL DATA: Weakness

EXAM:
PORTABLE ABDOMEN - 2 VIEW

[abdomen kub]
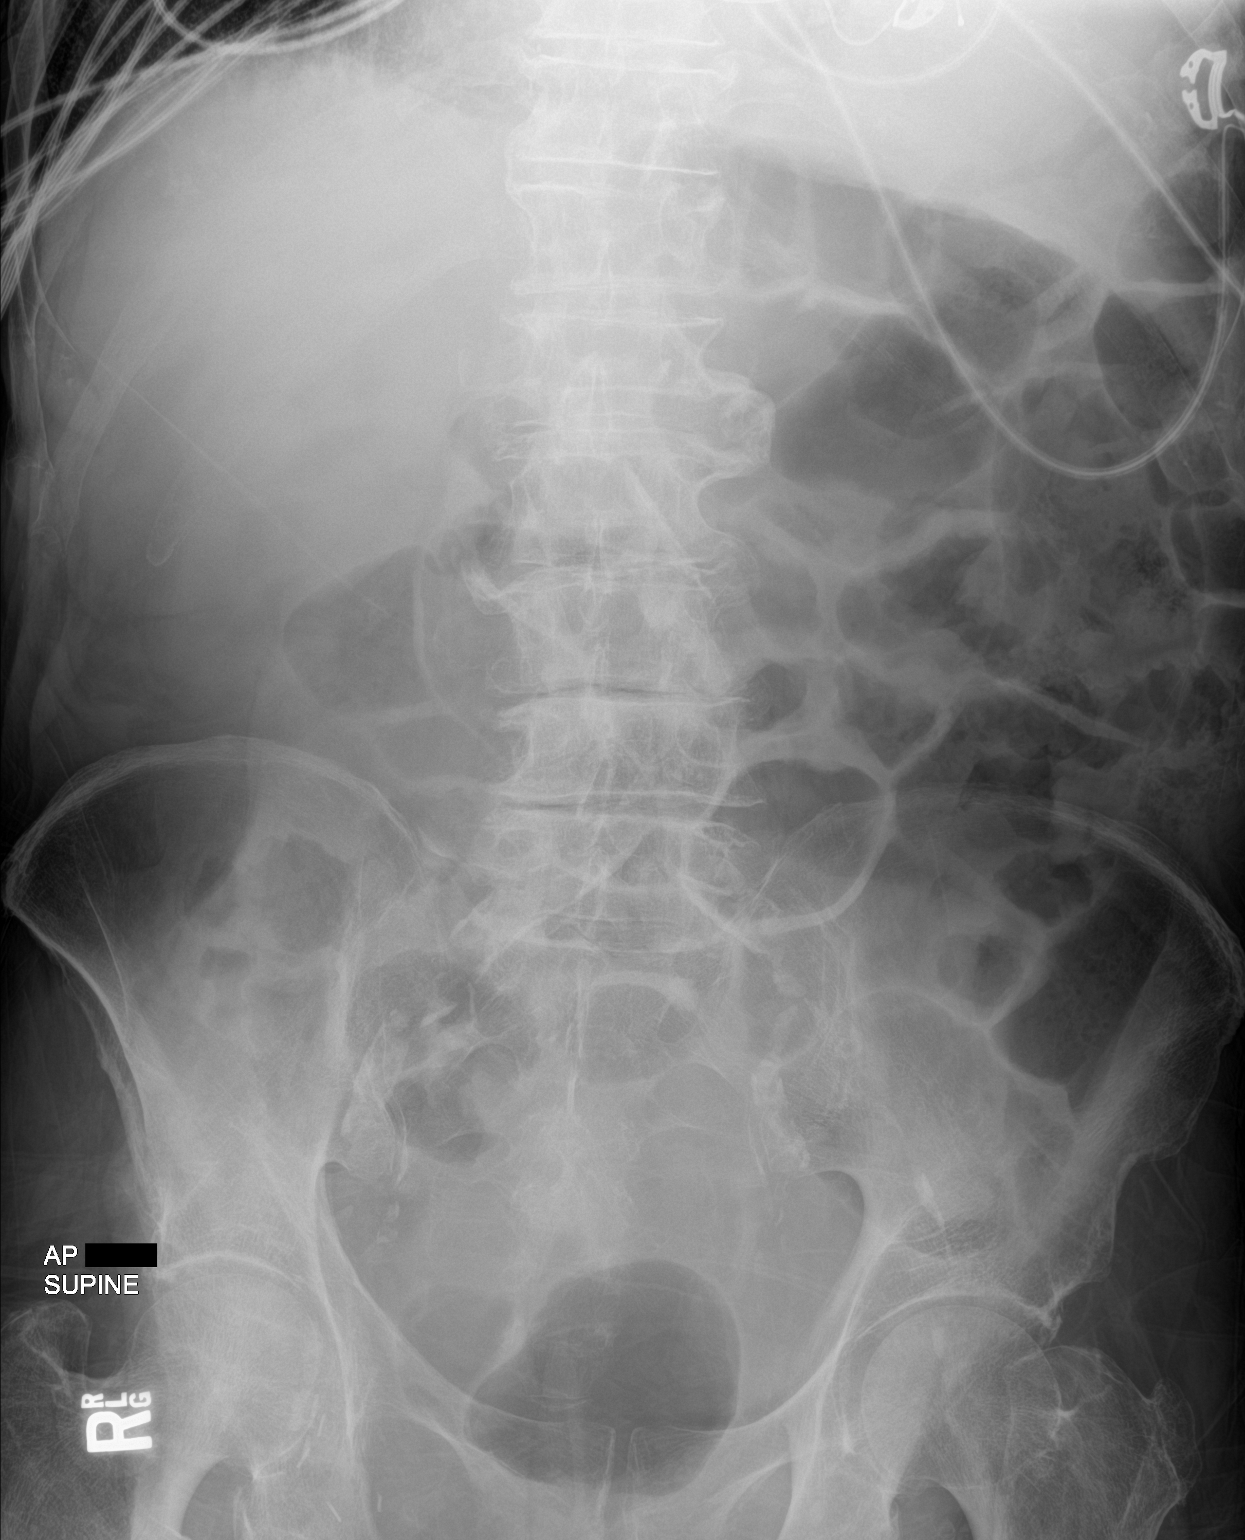

[1 of 1 positions shown; findings below may reference images not displayed]

FINDINGS: The bowel gas pattern is normal. There is no evidence of free air.
No radio-opaque calculi or other significant radiographic
abnormality is seen.
IMPRESSION: Negative.

## 2021-07-27 IMAGING — DX DG CHEST 1V PORT
2 series · 2 of 2 positions shown · non-contrast
Comparison: December 24, 2019

CLINICAL DATA: Shortness of breath.

EXAM:
PORTABLE CHEST 1 VIEW

[chest ap (1 of 2)]
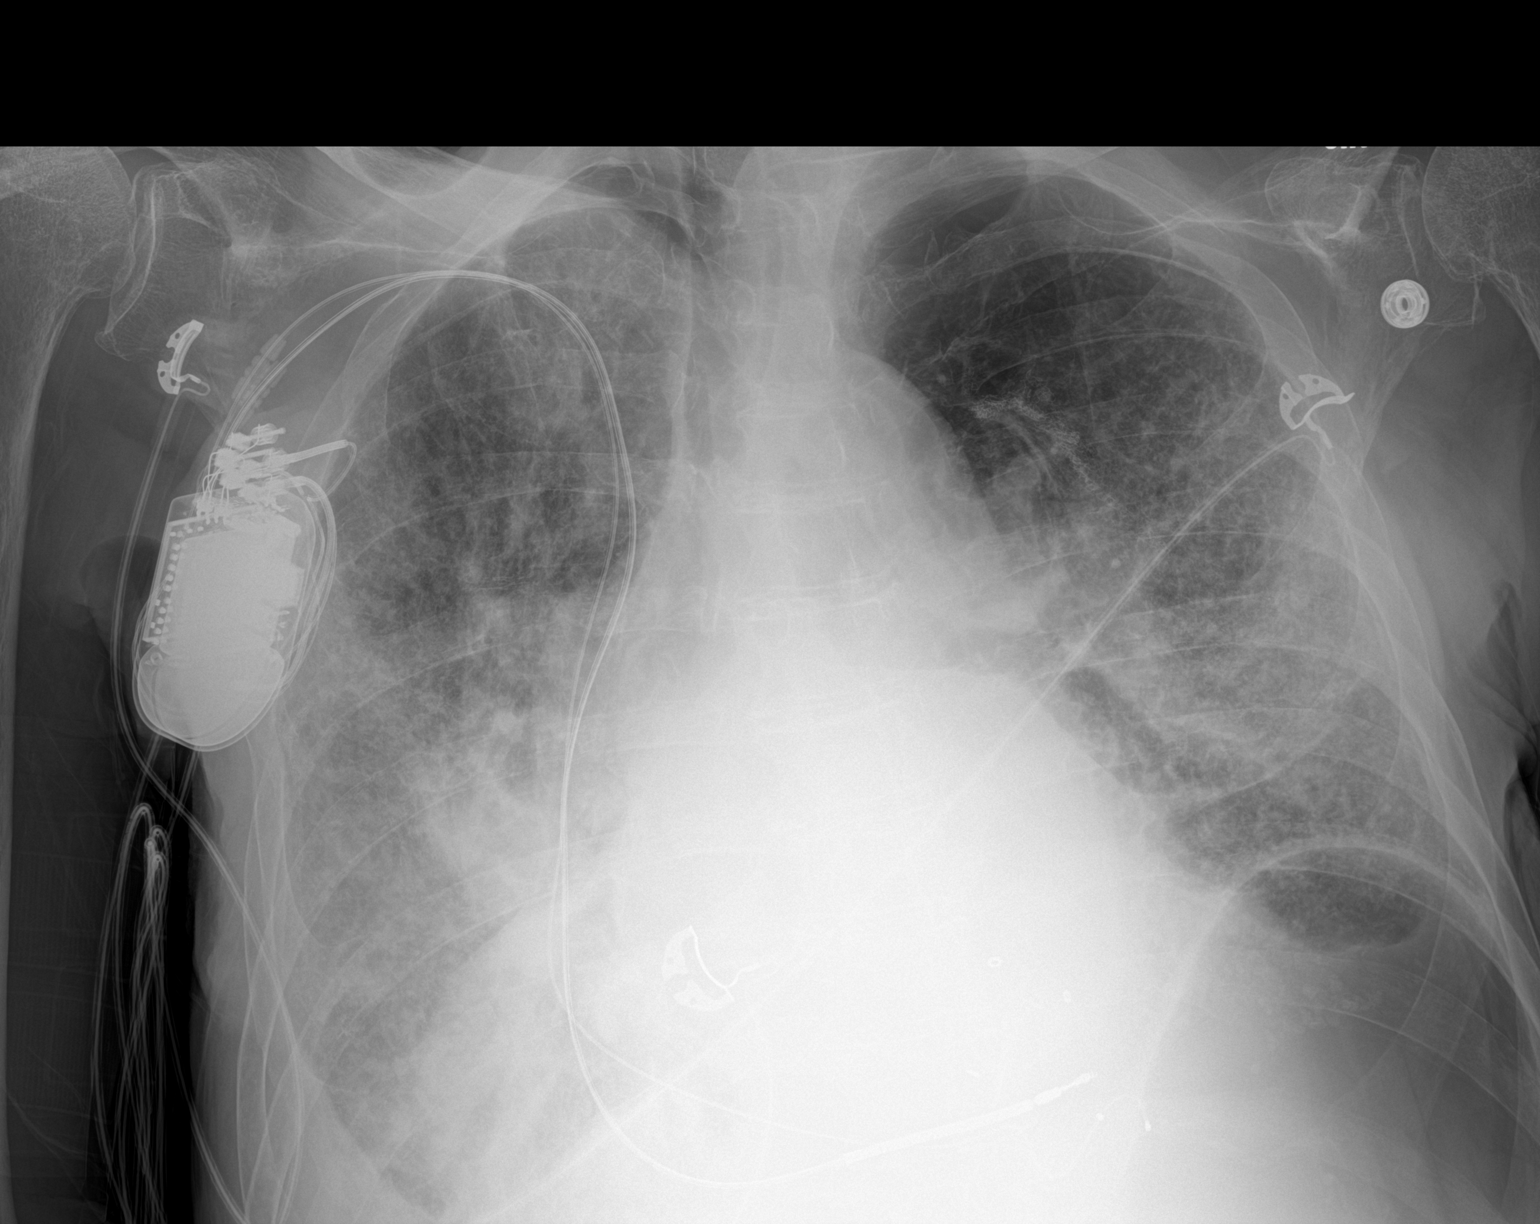

[chest ap (2 of 2)]
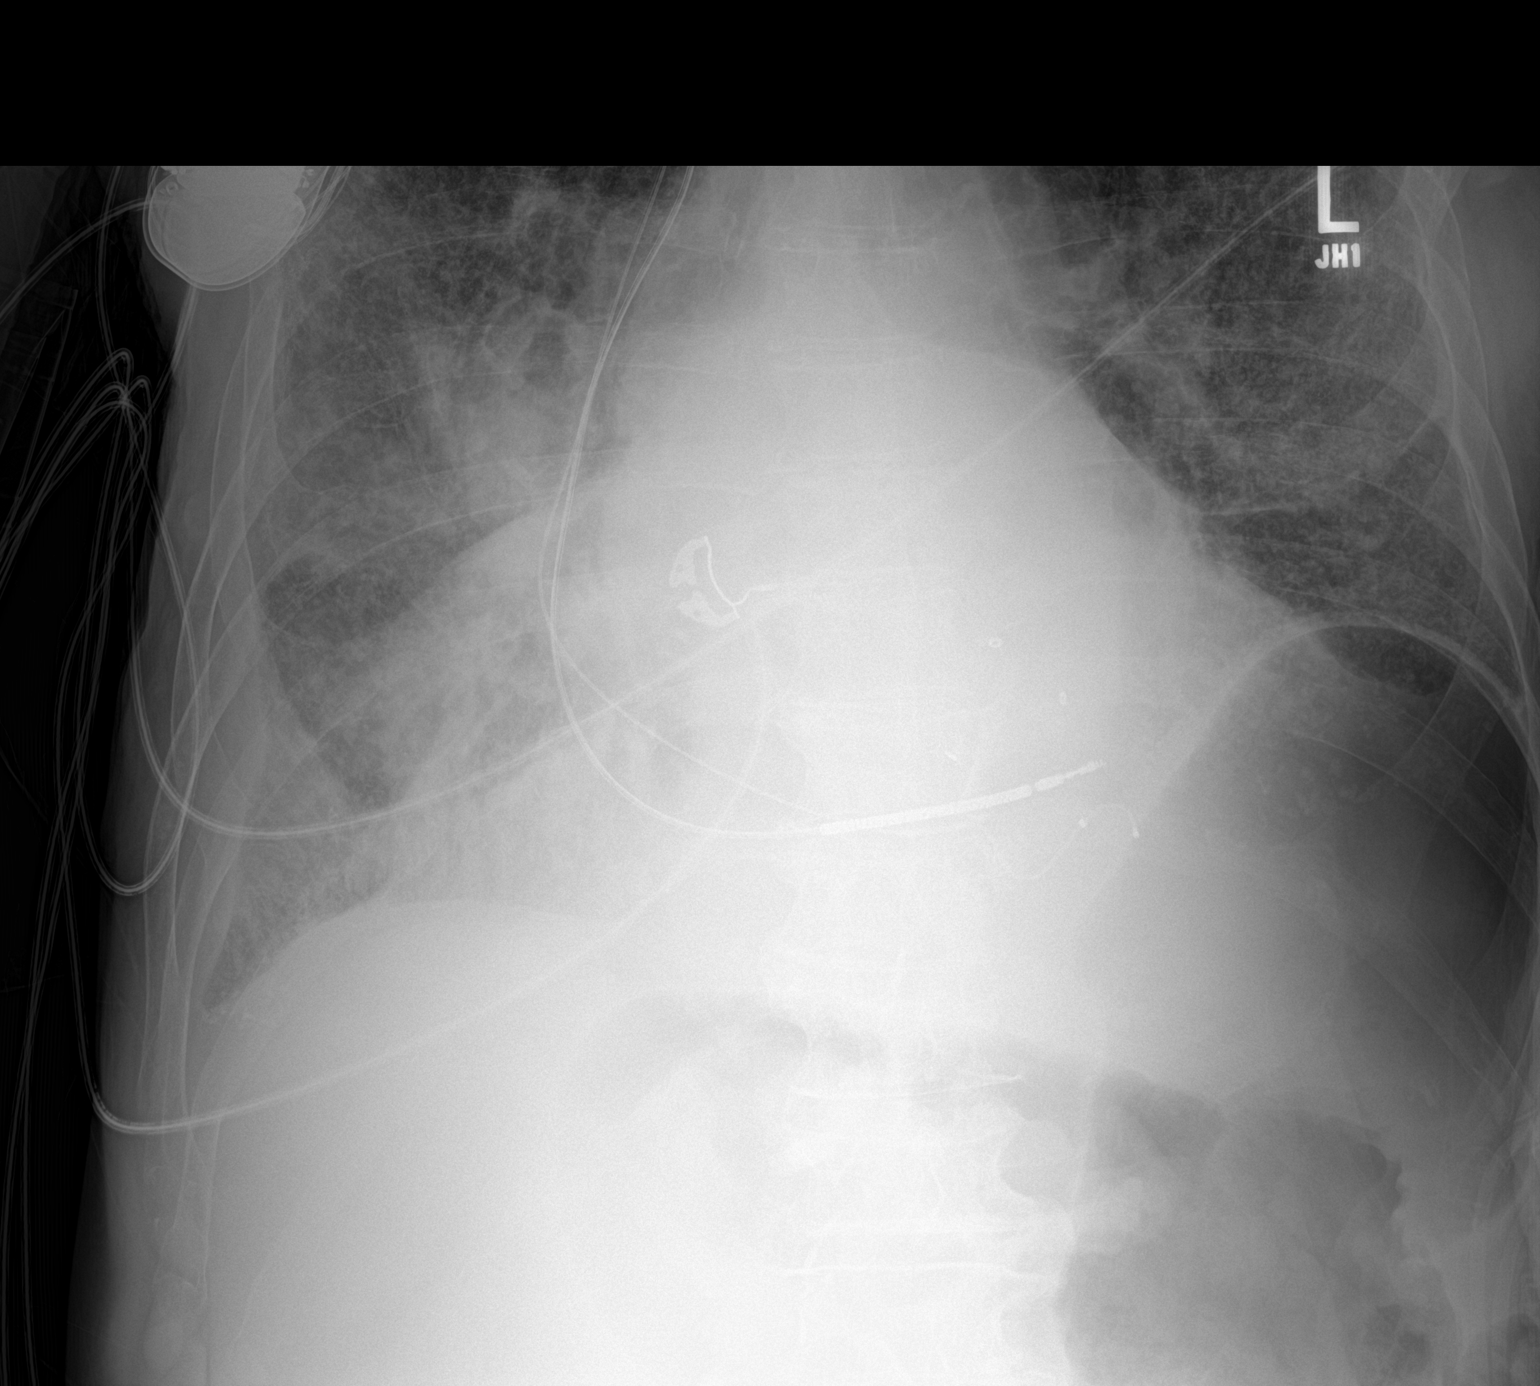

[2 of 2 positions shown; findings below may reference images not displayed]

FINDINGS: There is a dual lead AICD. Mild diffusely increased interstitial
lung markings are seen with mild areas of atelectasis and/or
infiltrate seen within the bilateral mid lung fields and right lung
base. There is a small right pleural effusion. No pneumothorax is
identified. The cardiac silhouette is moderately enlarged. The
visualized skeletal structures are unremarkable.
IMPRESSION: 1. Mild areas of atelectasis and/or infiltrate seen within the
bilateral mid lung fields and right lung base.
2. Small right pleural effusion.

## 2021-07-29 IMAGING — DX DG CHEST 1V PORT
1 series · 2 of 2 positions shown · non-contrast
Comparison: December 27, 2019

CLINICAL DATA: Shortness of breath

EXAM:
PORTABLE CHEST 1 VIEW

[Series 1: chest ap · 0.14mm/px · 2 of 2 slices shown]
[im 1/2]
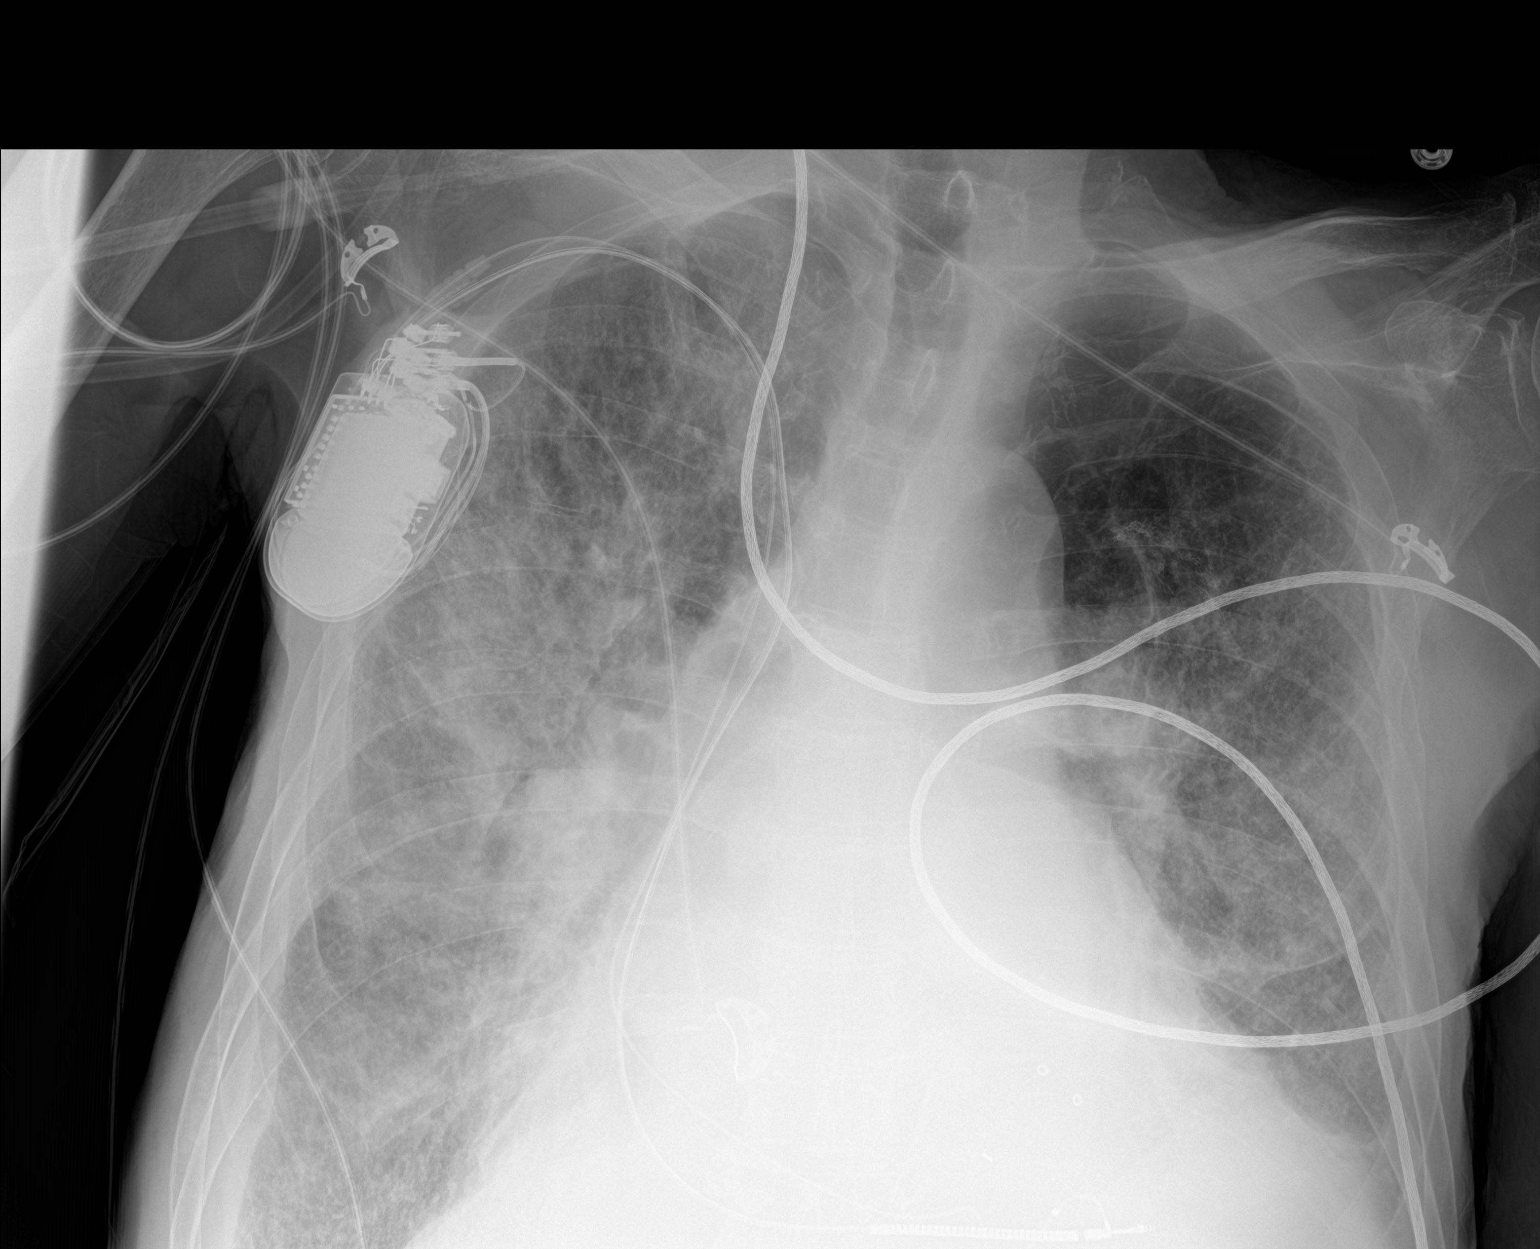
[im 2/2]
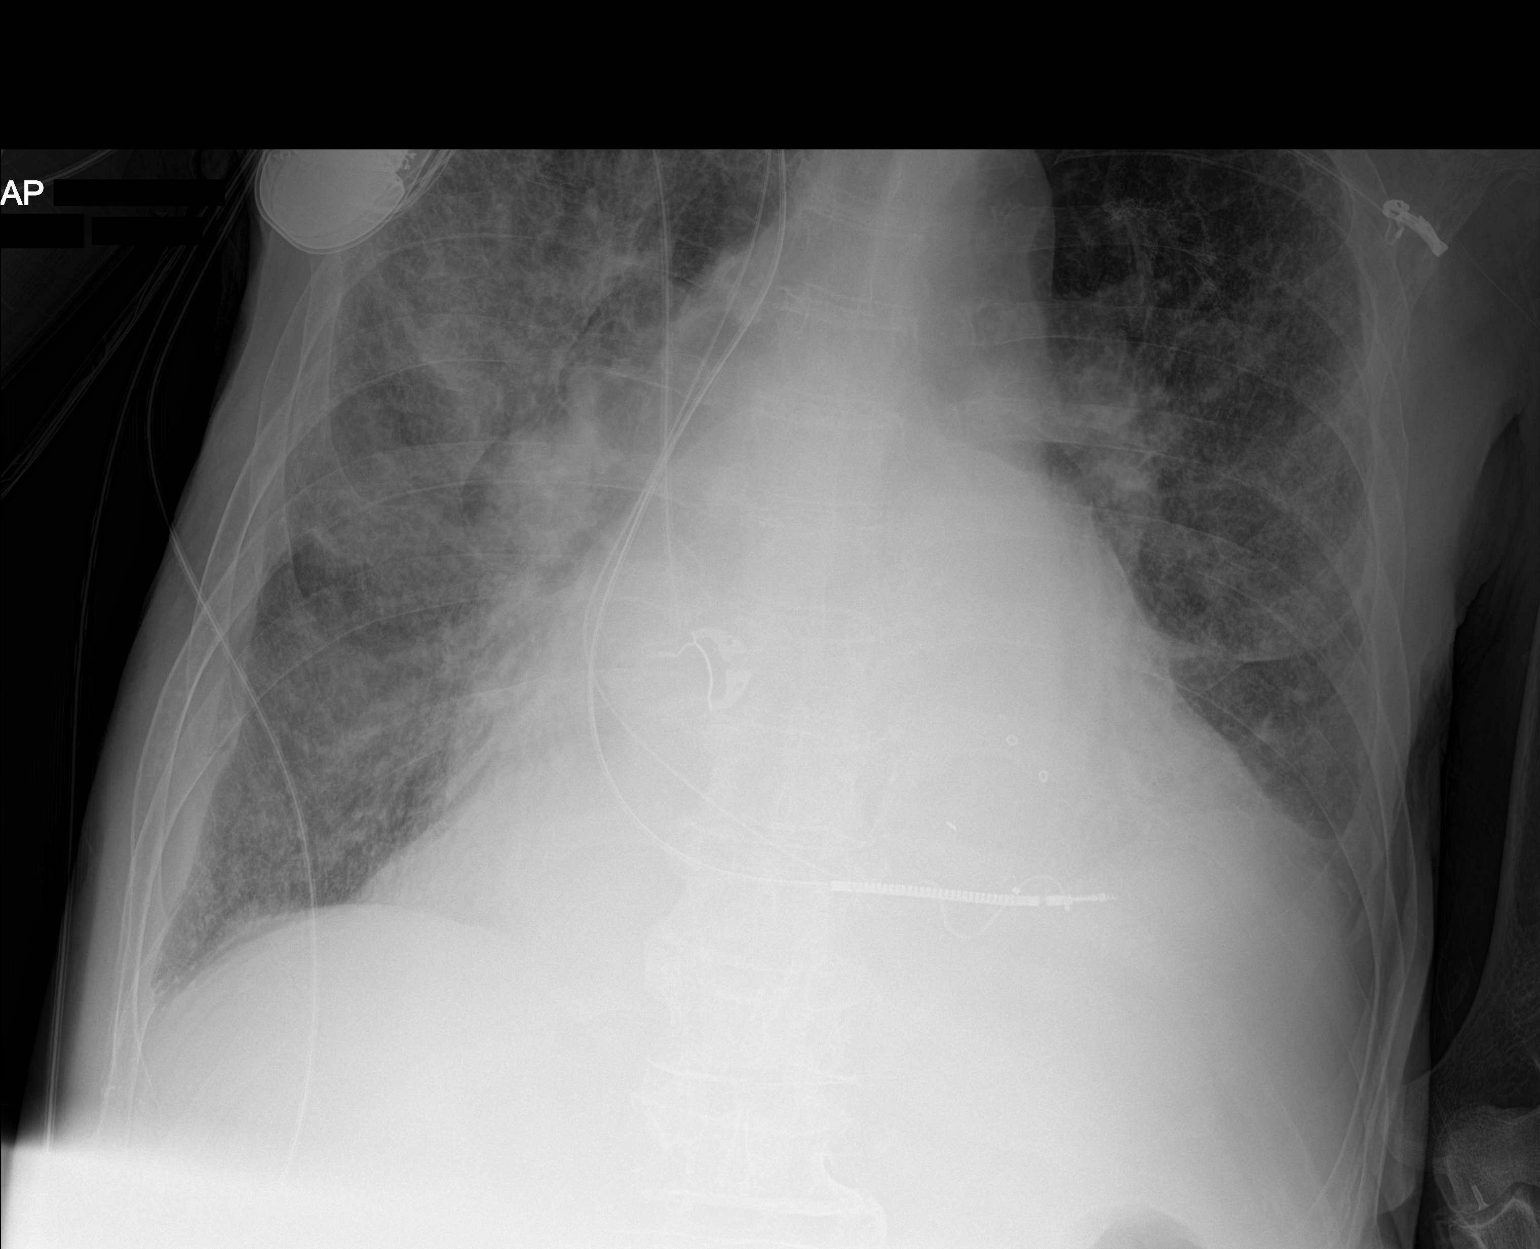

[2 of 2 positions shown; findings below may reference images not displayed]

FINDINGS: Bilateral pulmonary infiltrates are stable on the left and mildly
worsened in the right mid lung in the interval. Small right
effusion. Stable cardiomegaly. No other changes.
IMPRESSION: Mild worsening of right-sided pulmonary infiltrate. Stable left
infiltrate. No other changes.

## 2021-07-30 IMAGING — DX DG CHEST 1V PORT
1 series · 1 of 1 positions shown · non-contrast
Comparison: 01/06/2020

CLINICAL DATA: Short of breath

EXAM:
PORTABLE CHEST 1 VIEW

[chest ap]
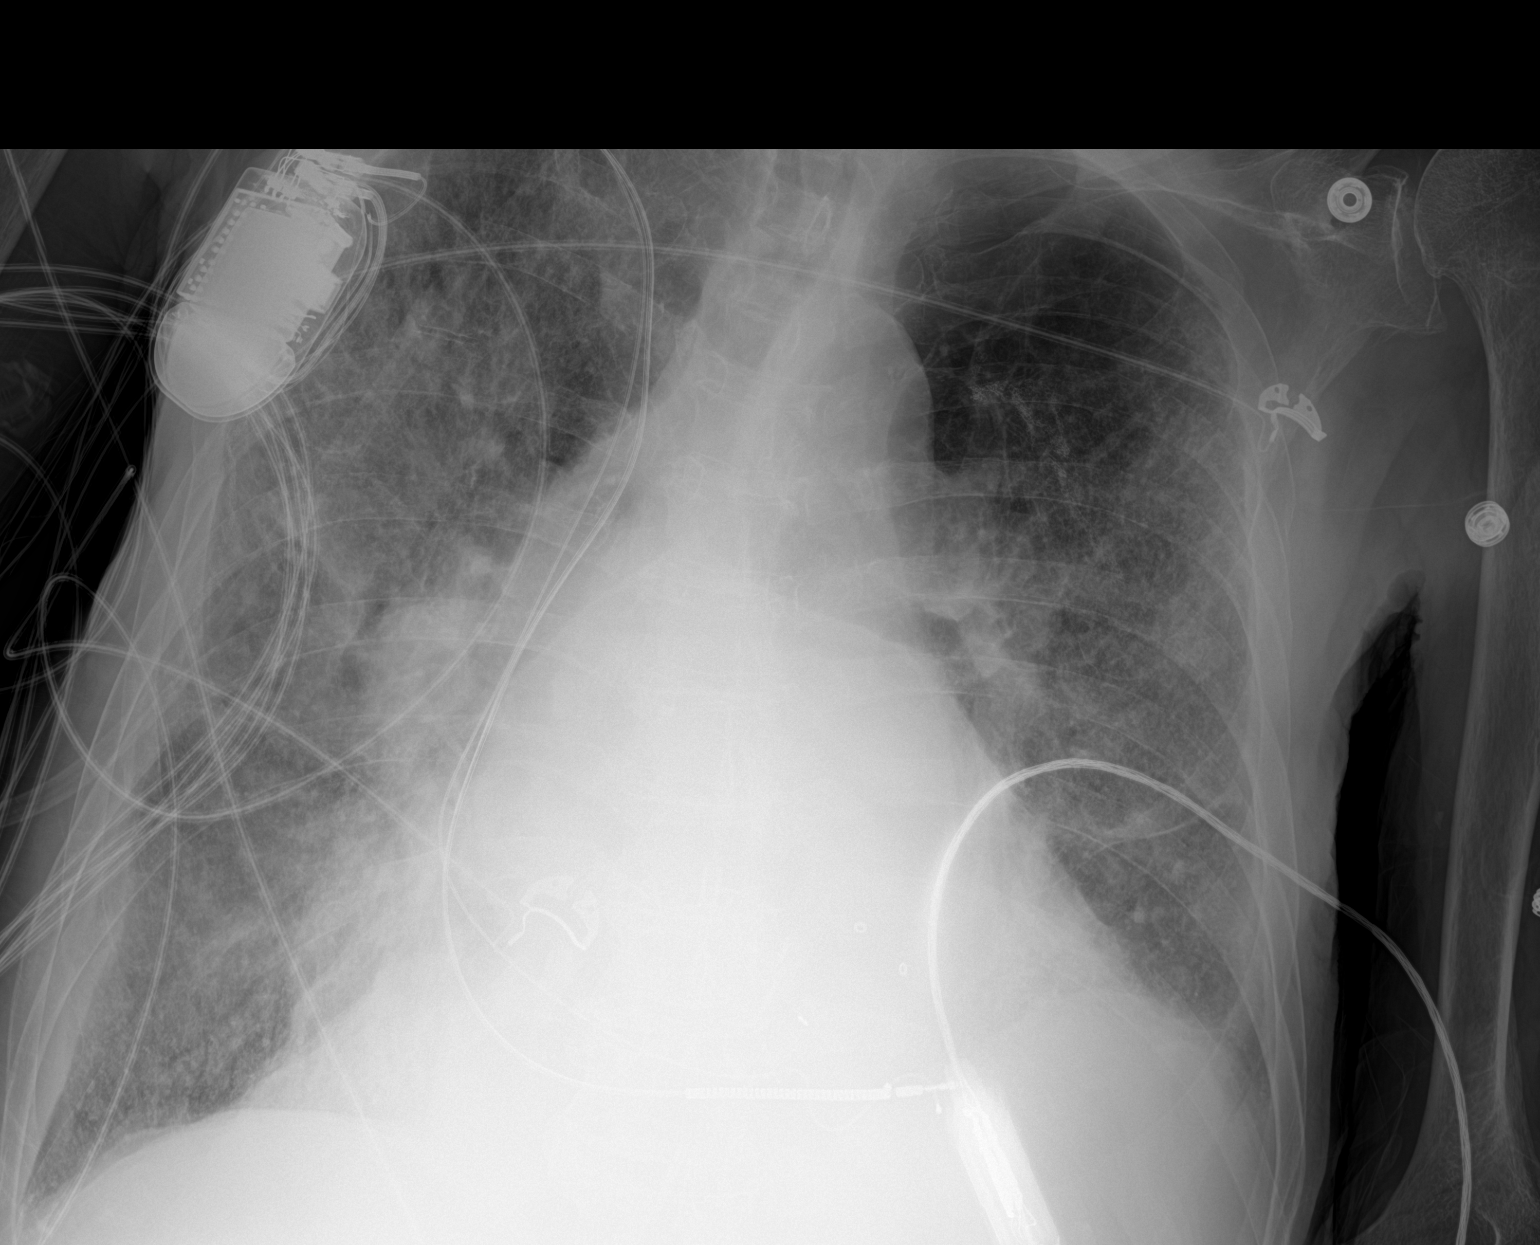

[1 of 1 positions shown; findings below may reference images not displayed]

FINDINGS: Bilateral airspace disease right greater than left. Mild improvement
in airspace disease on the right. No change on the left. Small
bilateral pleural effusions. Left lower lobe atelectasis unchanged.
AICD unchanged.
IMPRESSION: Diffuse bilateral airspace disease with mild improvement on the
right probable edema.

## 2021-07-31 IMAGING — CT CT CHEST W/ CM
2 of 3 series · 15 of 36 positions shown, 18 images · IV contrast (omnipaque)
Comparison: December 06, 2019

CLINICAL DATA: Dyspnea.

EXAM:
CT CHEST WITH CONTRAST
TECHNIQUE: Multidetector CT imaging of the chest was performed during
intravenous contrast administration.
CONTRAST:  75mL OMNIPAQUE IOHEXOL 300 MG/ML  SOLN

[Series 2: axial st · axial · 0.84mm/px · z∈[-369,-53]mm · 12 of 186 slices shown, 15 images]
[im 14/186  mediastinal]
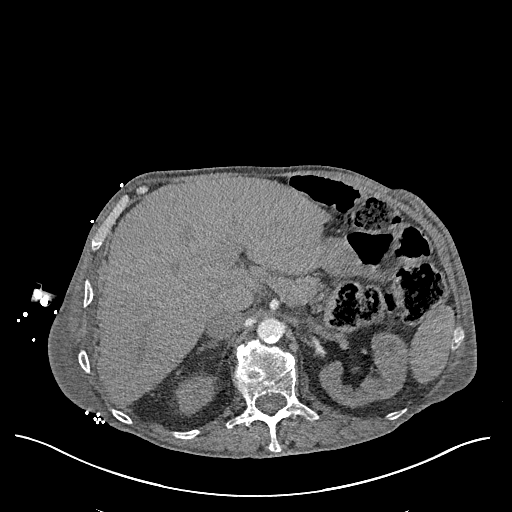
[im 14/186  lung]
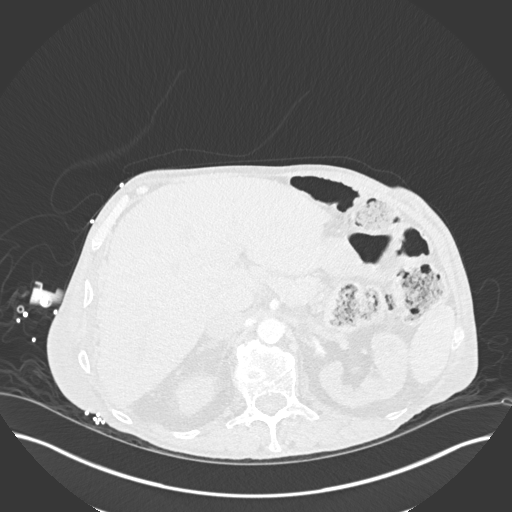
[im 28/186  lung]
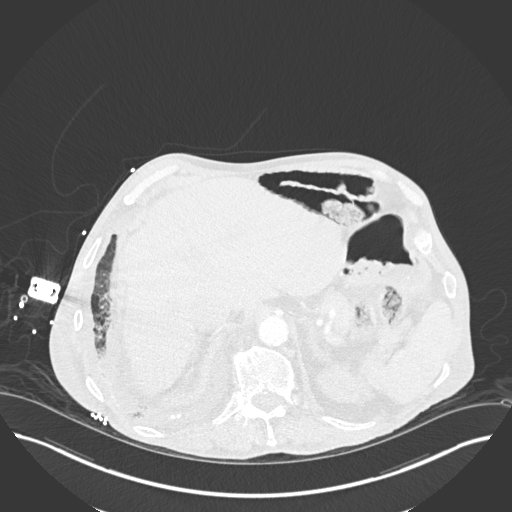
[im 42/186  lung]
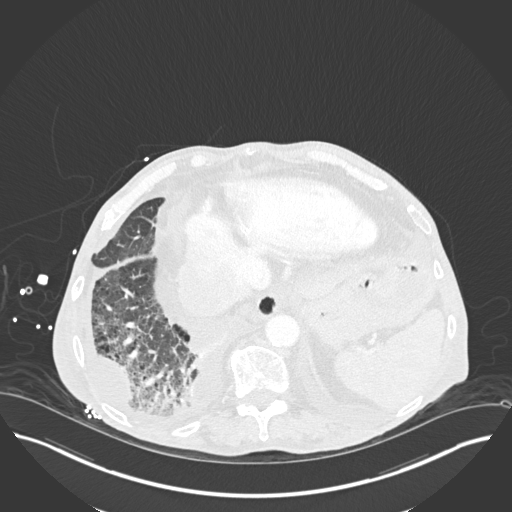
[im 55/186  lung]
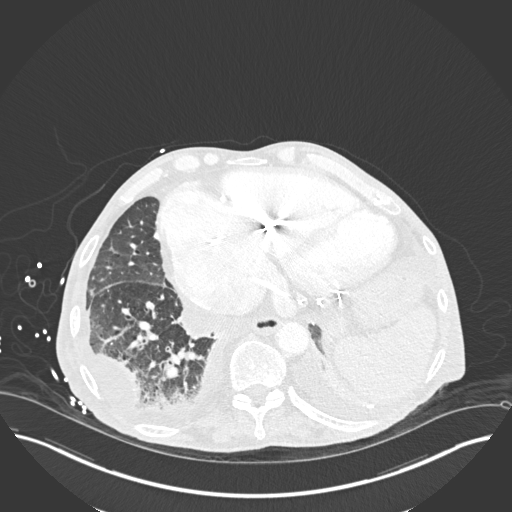
[im 69/186  mediastinal]
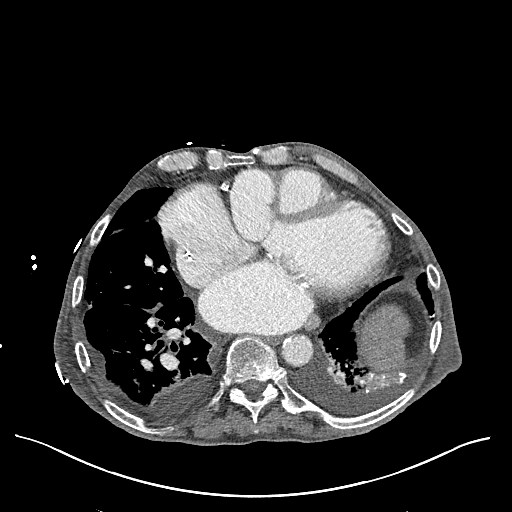
[im 69/186  lung]
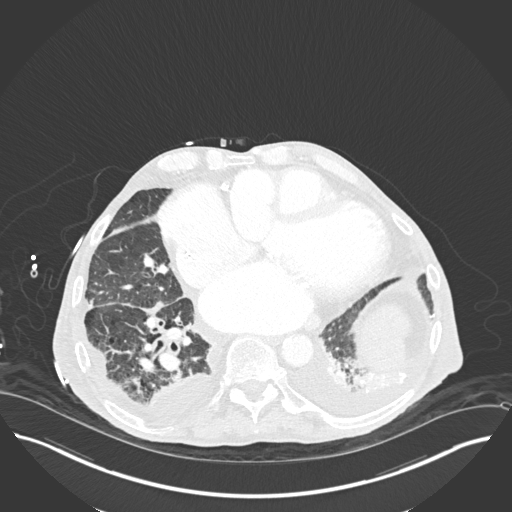
[im 83/186  lung]
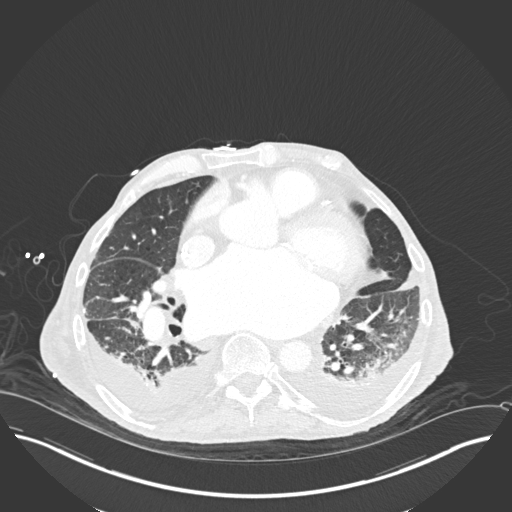
[im 103/186  lung]
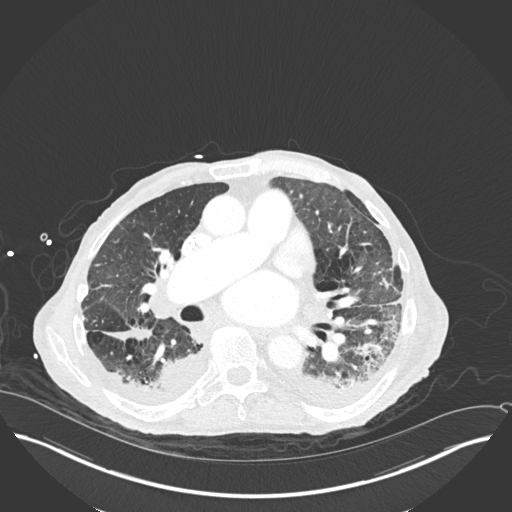
[im 117/186  lung]
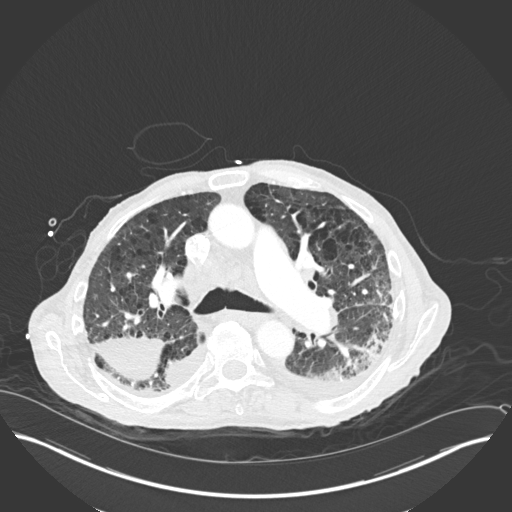
[im 131/186  mediastinal]
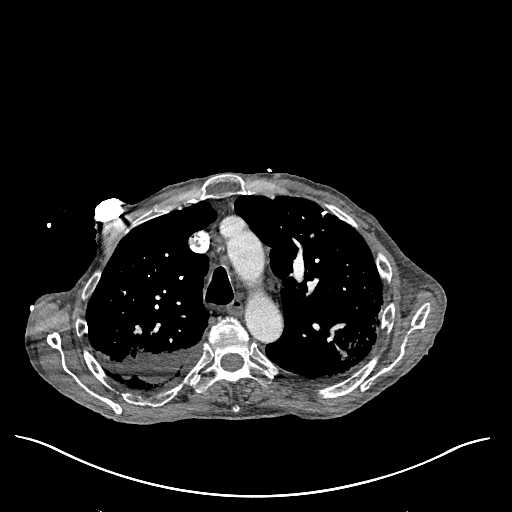
[im 131/186  lung]
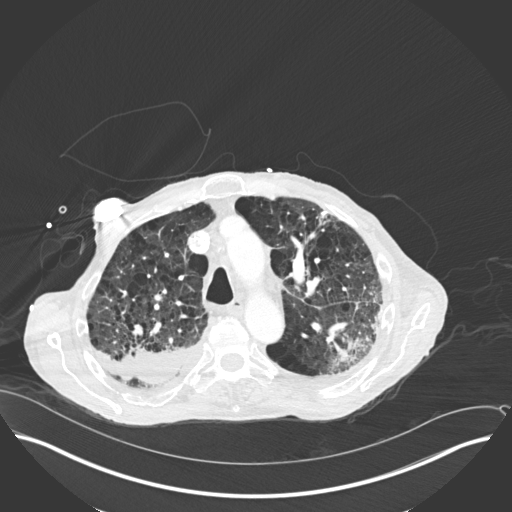
[im 144/186  lung]
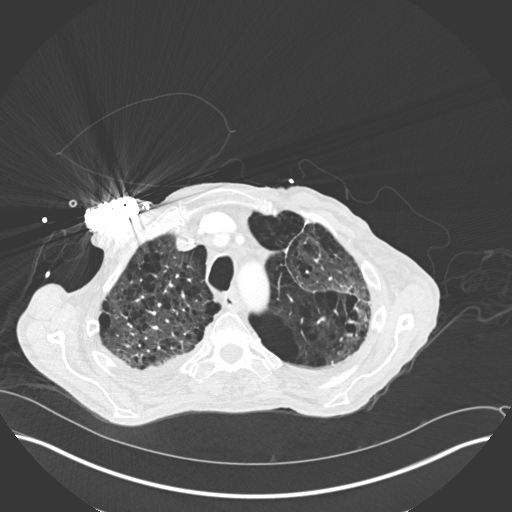
[im 158/186  lung]
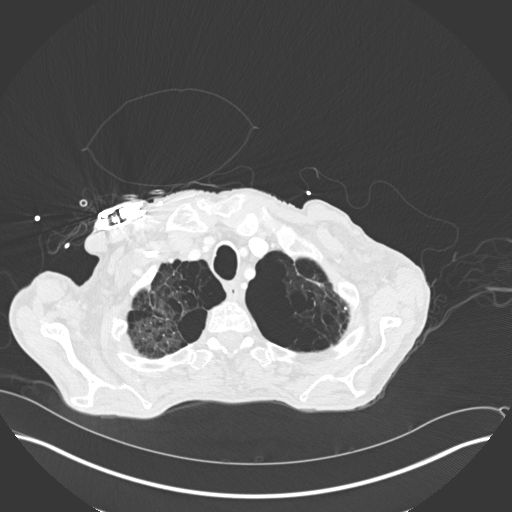
[im 172/186  lung]
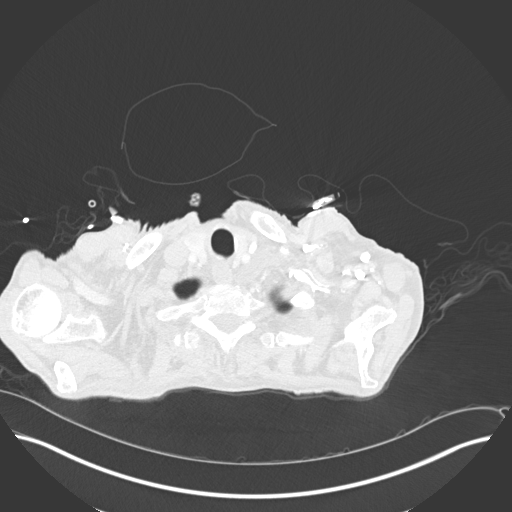

[Series 5: coronal · coronal · 0.71mm/px · 3 of 133 slices shown]
[im 27/133  lung]
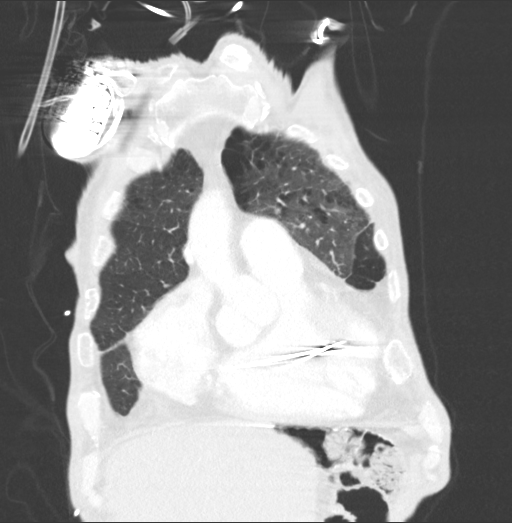
[im 53/133  lung]
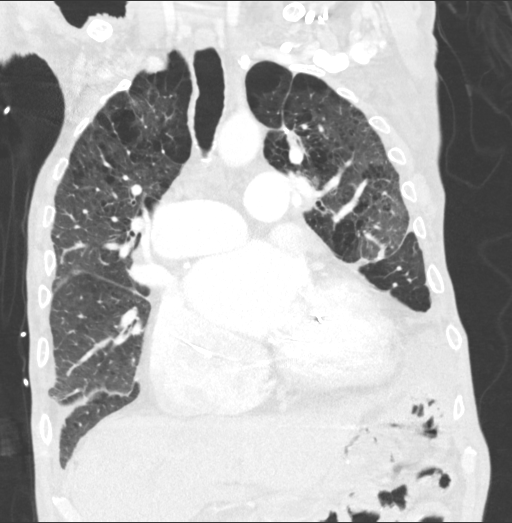
[im 80/133  lung]
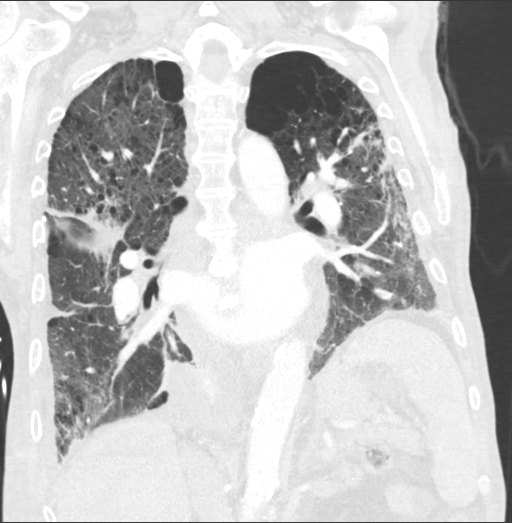

[15 of 36 positions shown; findings below may reference images not displayed]

FINDINGS: Cardiovascular: There is a dual lead AICD. There is dilatation of
the right pulmonary artery (3.7 cm). Stable marked severity
cardiomegaly is noted. No pericardial effusion. A stable sinus of
Valsalva aneurysm is seen. Marked severity coronary artery
calcification is also seen.

Mediastinum/Nodes: There is stable mild to moderate severity
pretracheal and AP window lymphadenopathy.

Lungs/Pleura: There is marked severity emphysematous lung disease.

Stable mild to moderate severity areas of atelectasis and/or
infiltrate are seen within the posterior aspects of the bilateral
lower lobes.

Stable bilateral moderate severity pleural effusions are seen with
stable loculated components.

There is no evidence of a pneumothorax.

Upper Abdomen: Stable left renal cysts are noted.

Musculoskeletal: Chronic right-sided rib deformities are seen with
multilevel degenerative changes noted throughout the thoracic spine.
IMPRESSION: 1. Stable mild to moderate severity posterior bilateral lower lobe
atelectasis and/or infiltrate.
2. Stable bilateral moderate severity pleural effusions with stable
loculated components.
3. Marked severity cardiomegaly.
4. Stable sinus of Valsalva aneurysm.
5. Stable dilatation of the right pulmonary artery (3.7 cm).
6. Left renal cysts.
7. Emphysema.

Emphysema ([id number]).

## 2021-07-31 IMAGING — DX DG CHEST 1V PORT
1 series · 2 of 2 positions shown · non-contrast
Comparison: 12/30/2019

CLINICAL DATA: Shortness of breath.

EXAM:
PORTABLE CHEST 1 VIEW

[Series 1: chest ap · 0.14mm/px · 2 of 2 slices shown]
[im 1/2]
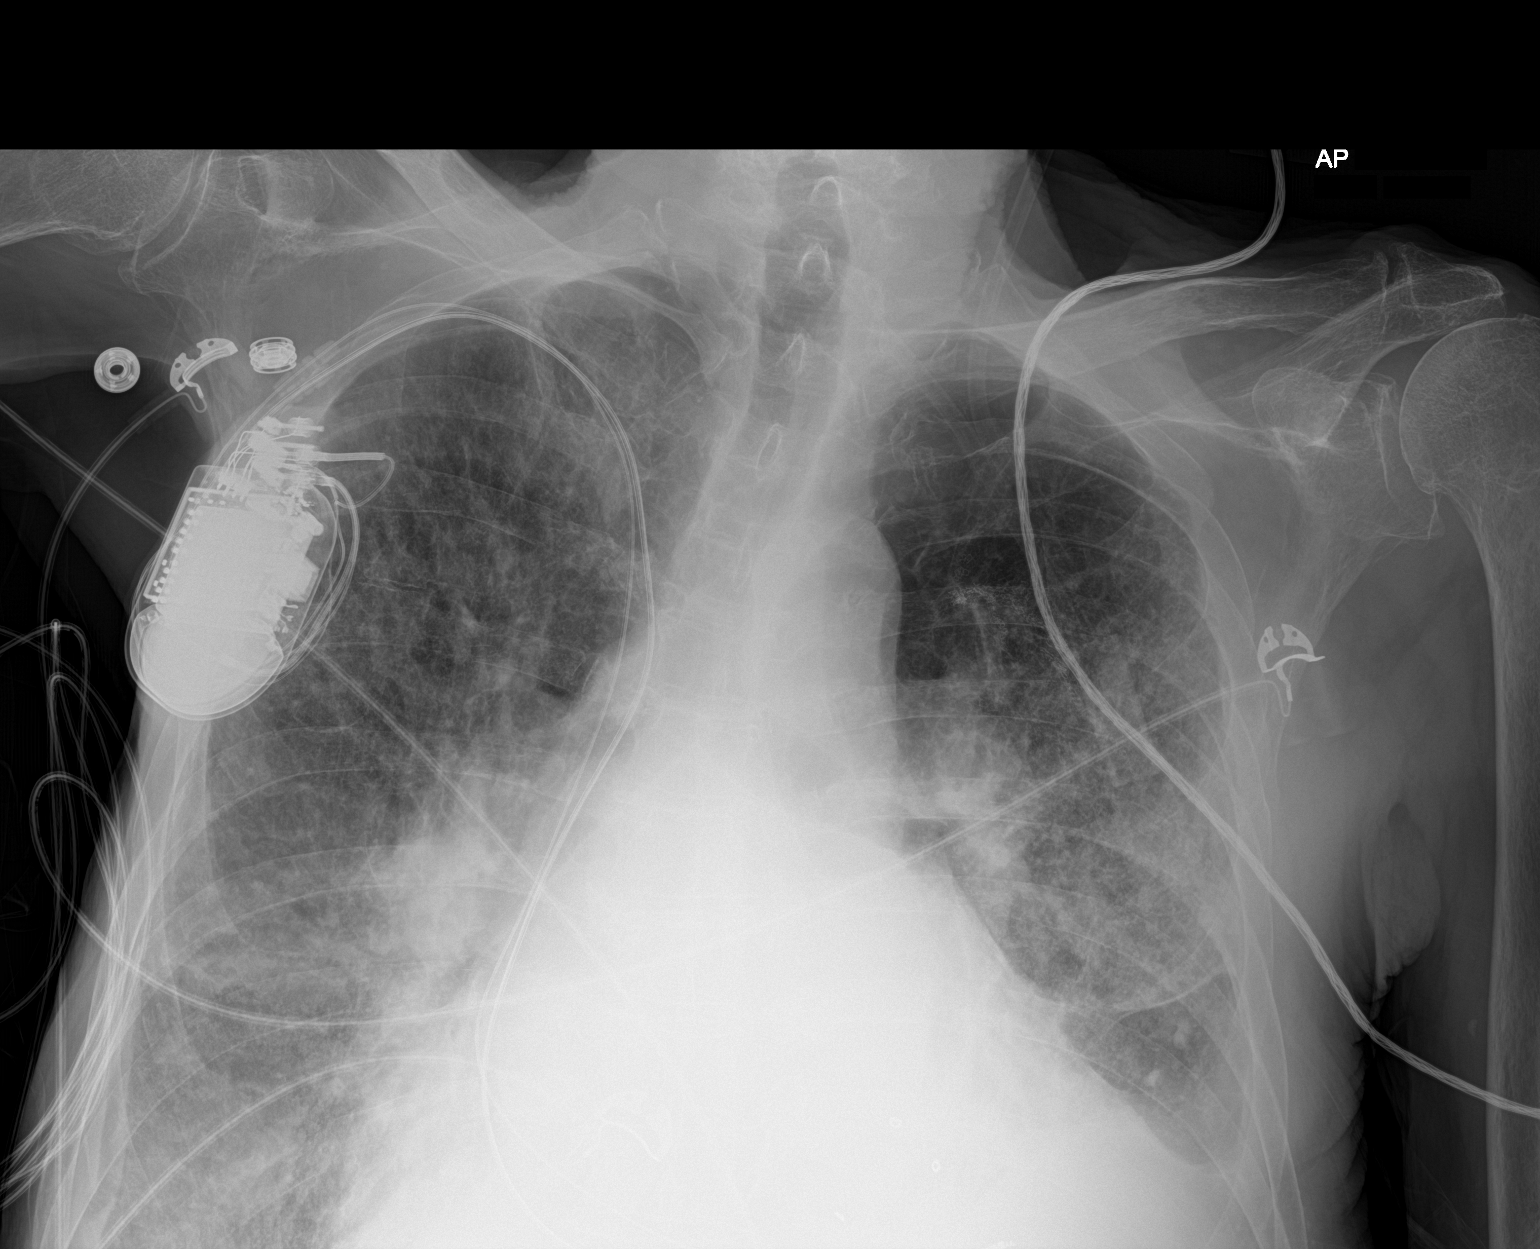
[im 2/2]
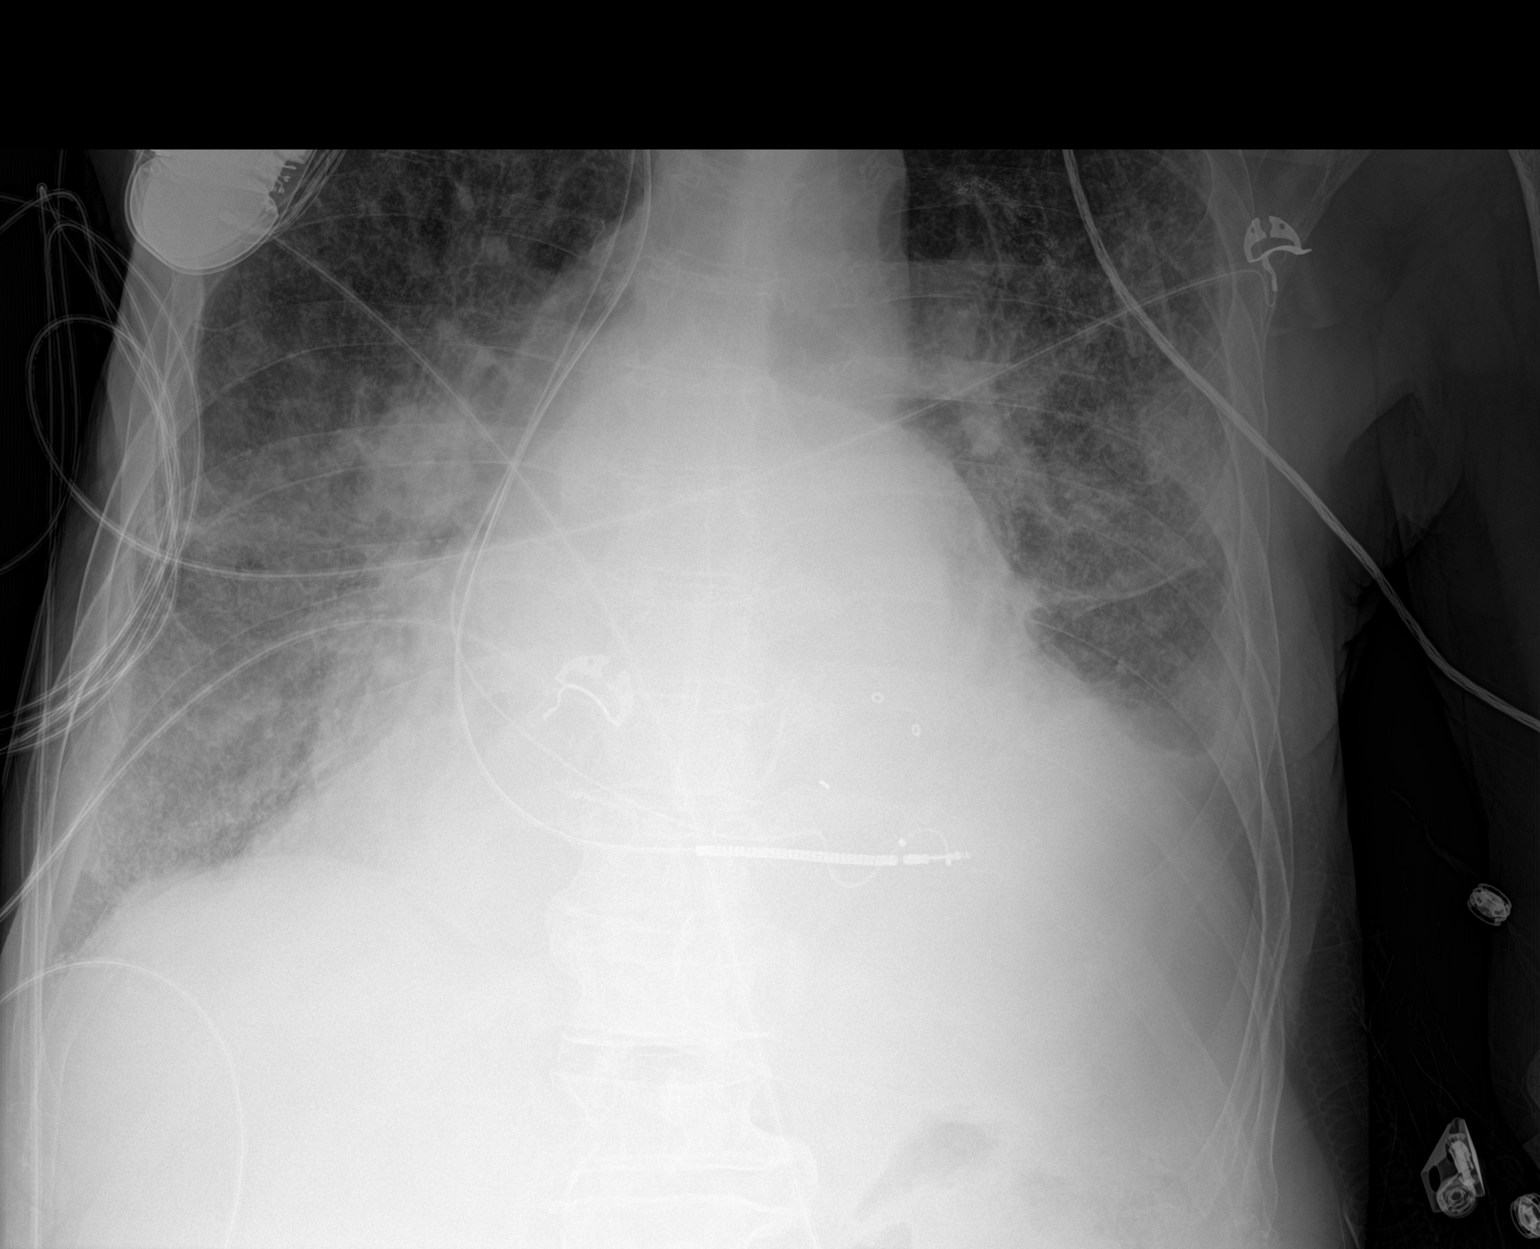

[2 of 2 positions shown; findings below may reference images not displayed]

FINDINGS: An ICD remains in place. The cardiac silhouette remains enlarged.
Widespread airspace disease in the right greater than left lungs and
asymmetric left basilar volume loss have not significantly changed.
There are persistent small bilateral pleural effusions. No
pneumothorax is identified.
IMPRESSION: Unchanged widespread bilateral airspace disease and small bilateral
pleural effusions.

## 2021-08-21 IMAGING — CR DG CHEST 1V PORT
1 series · 1 of 1 positions shown · non-contrast
Comparison: Radiograph 12/31/2019

CLINICAL DATA: Increased rhonchi

EXAM:
PORTABLE CHEST 1 VIEW

[AP]
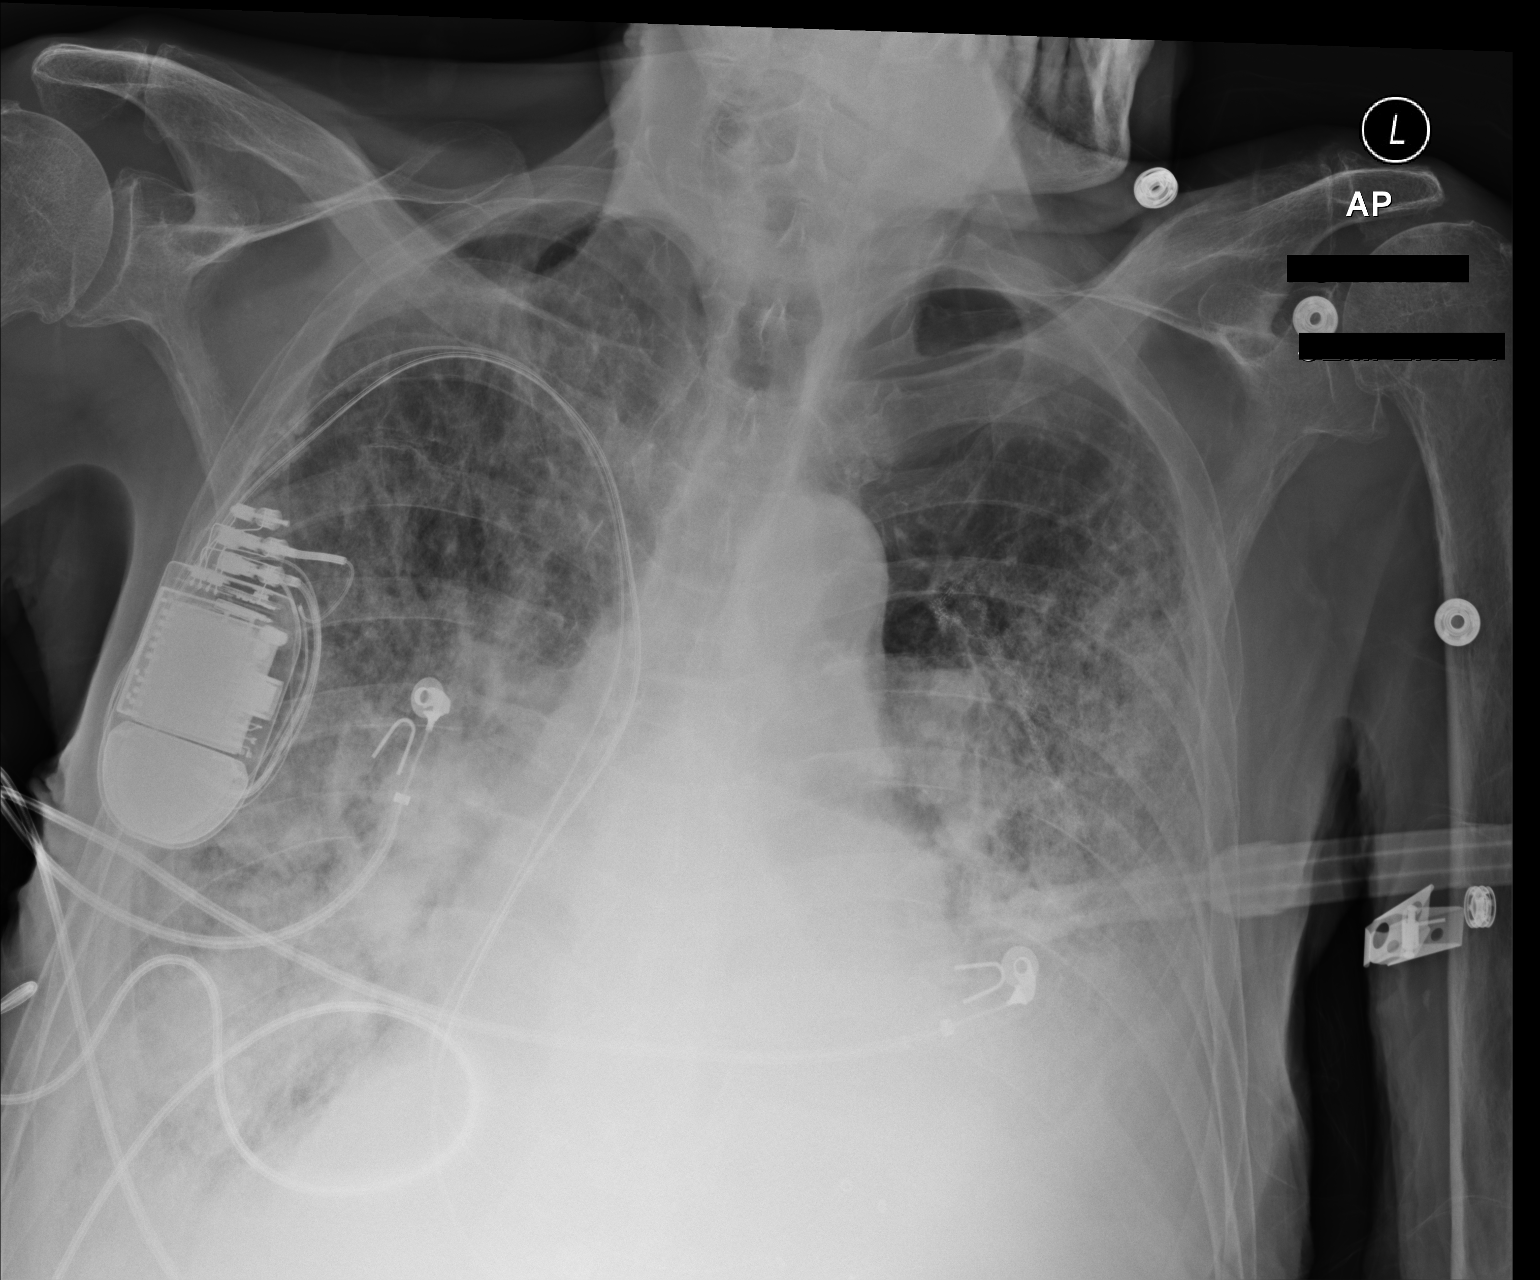

[1 of 1 positions shown; findings below may reference images not displayed]

FINDINGS: Stable enlarged cardiac silhouette. There is increased bibasilar
airspace disease as well as bilateral pleural effusions. Airspace
disease is extending in the upper lobes.
IMPRESSION: Worsening multifocal pneumonia with a bibasilar predominance.

Small effusions.

These results will be called to the ordering clinician or
representative by the Radiologist Assistant, and communication
documented in the PACS or [[email or web address]].

## 2021-08-26 IMAGING — DX DG CHEST 1V PORT
1 series · 4 of 4 positions shown · non-contrast
Comparison: 01/21/2020

CLINICAL DATA: Respiratory failure

EXAM:
PORTABLE CHEST 1 VIEW

[Series 1: chest · 0.14mm/px · 4 of 4 slices shown]
[im 1/4]
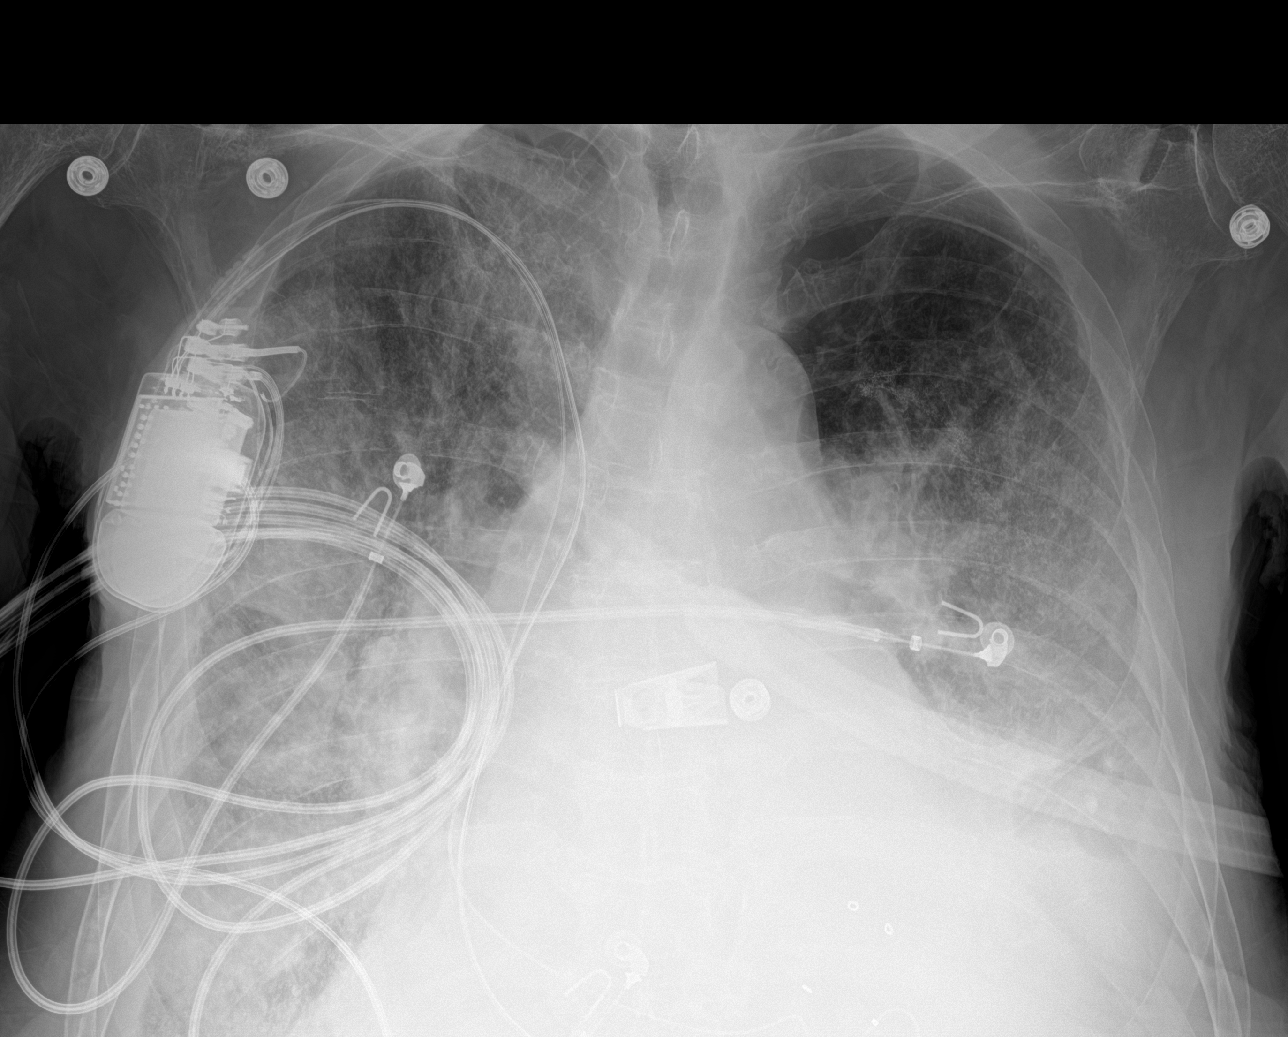
[im 2/4]
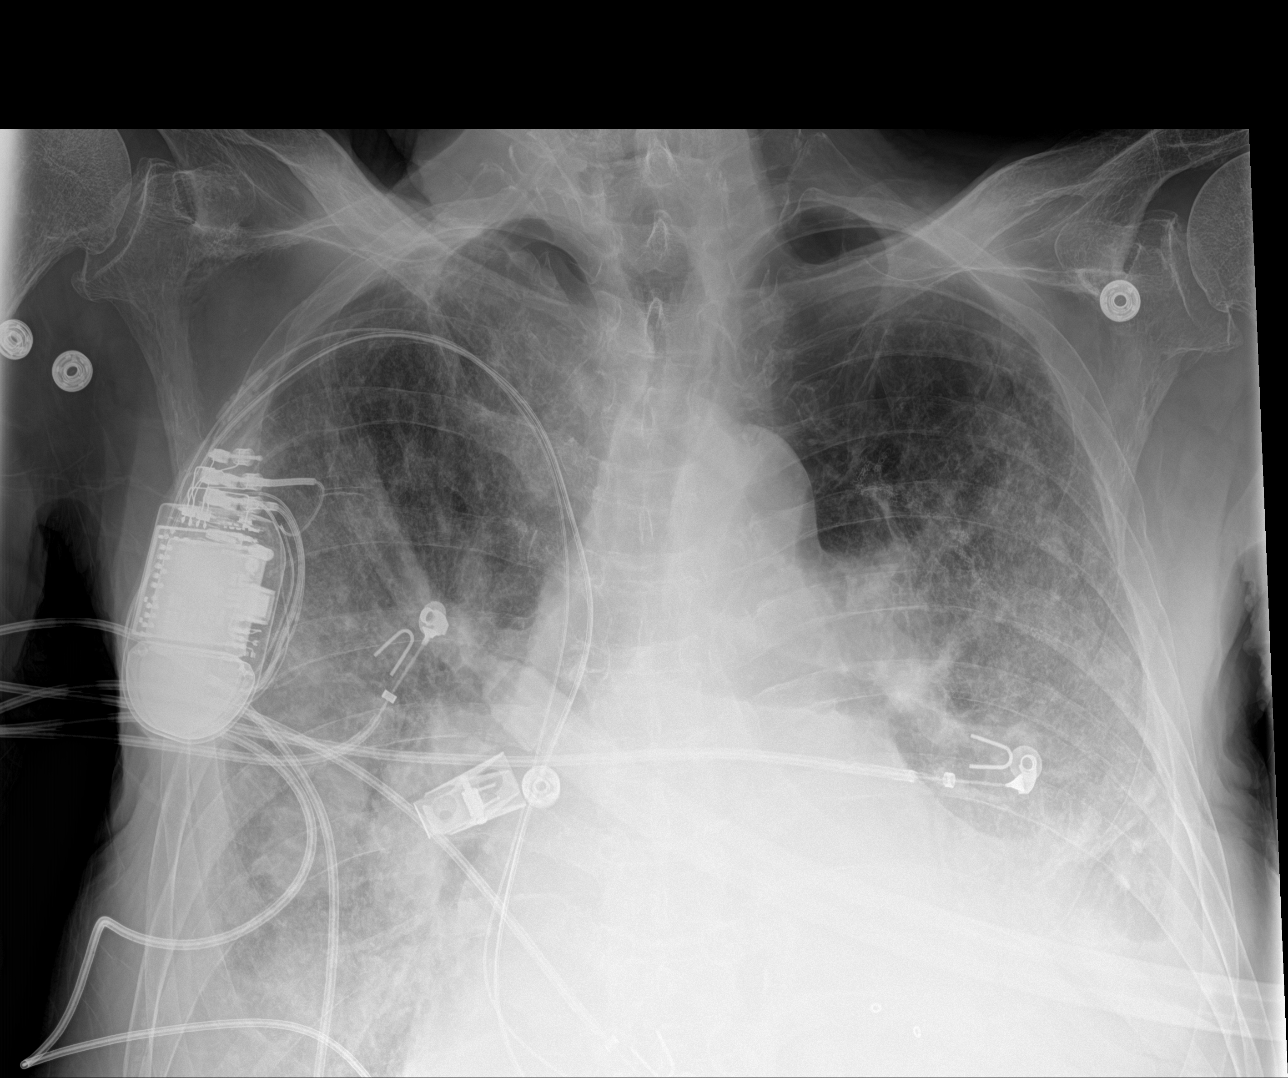
[im 3/4]
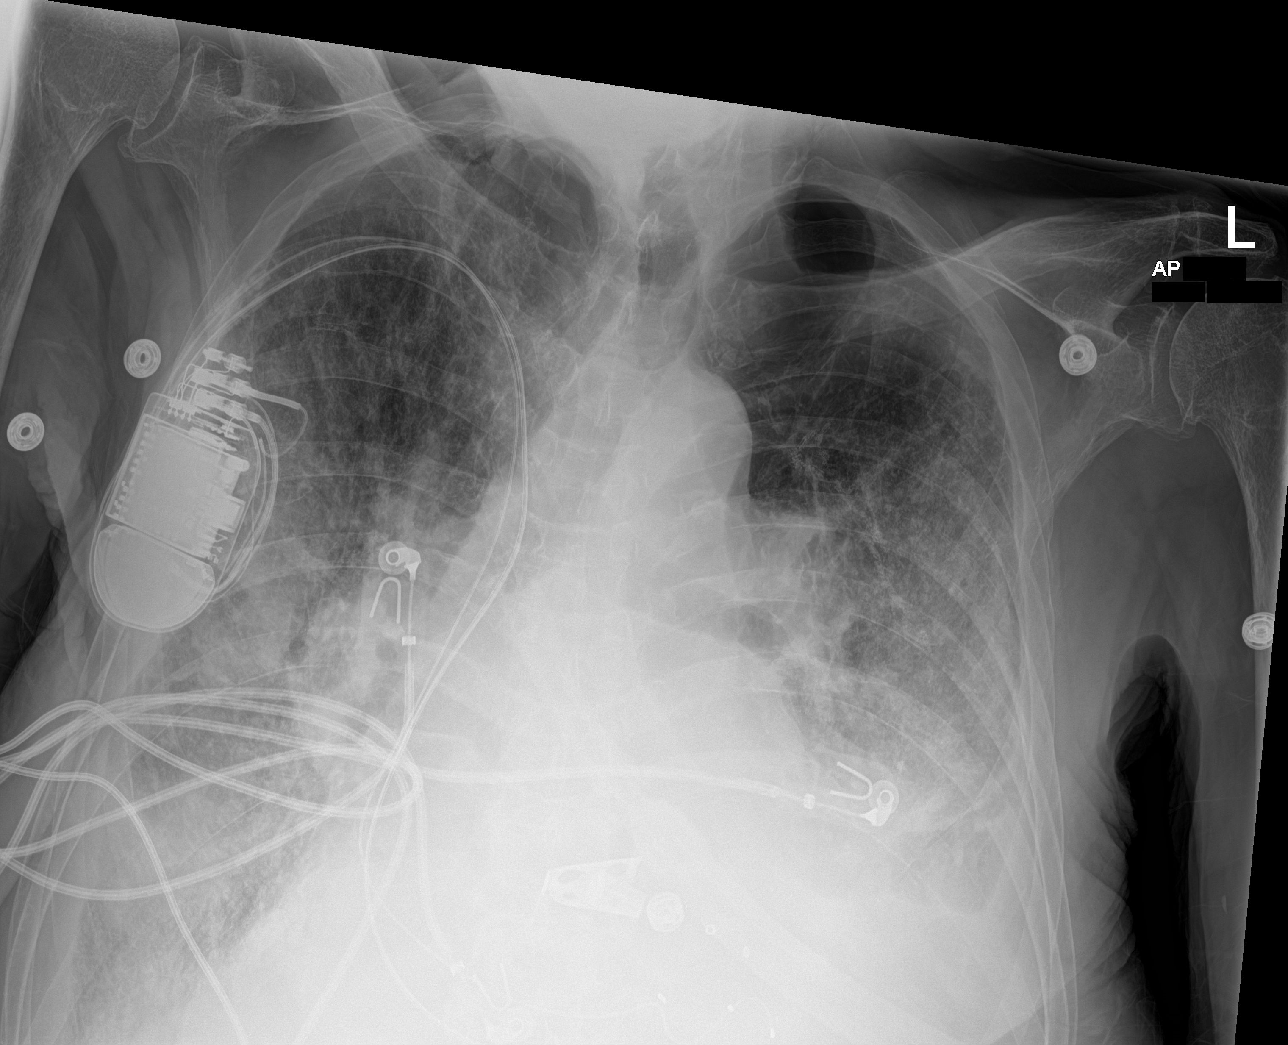
[im 4/4]
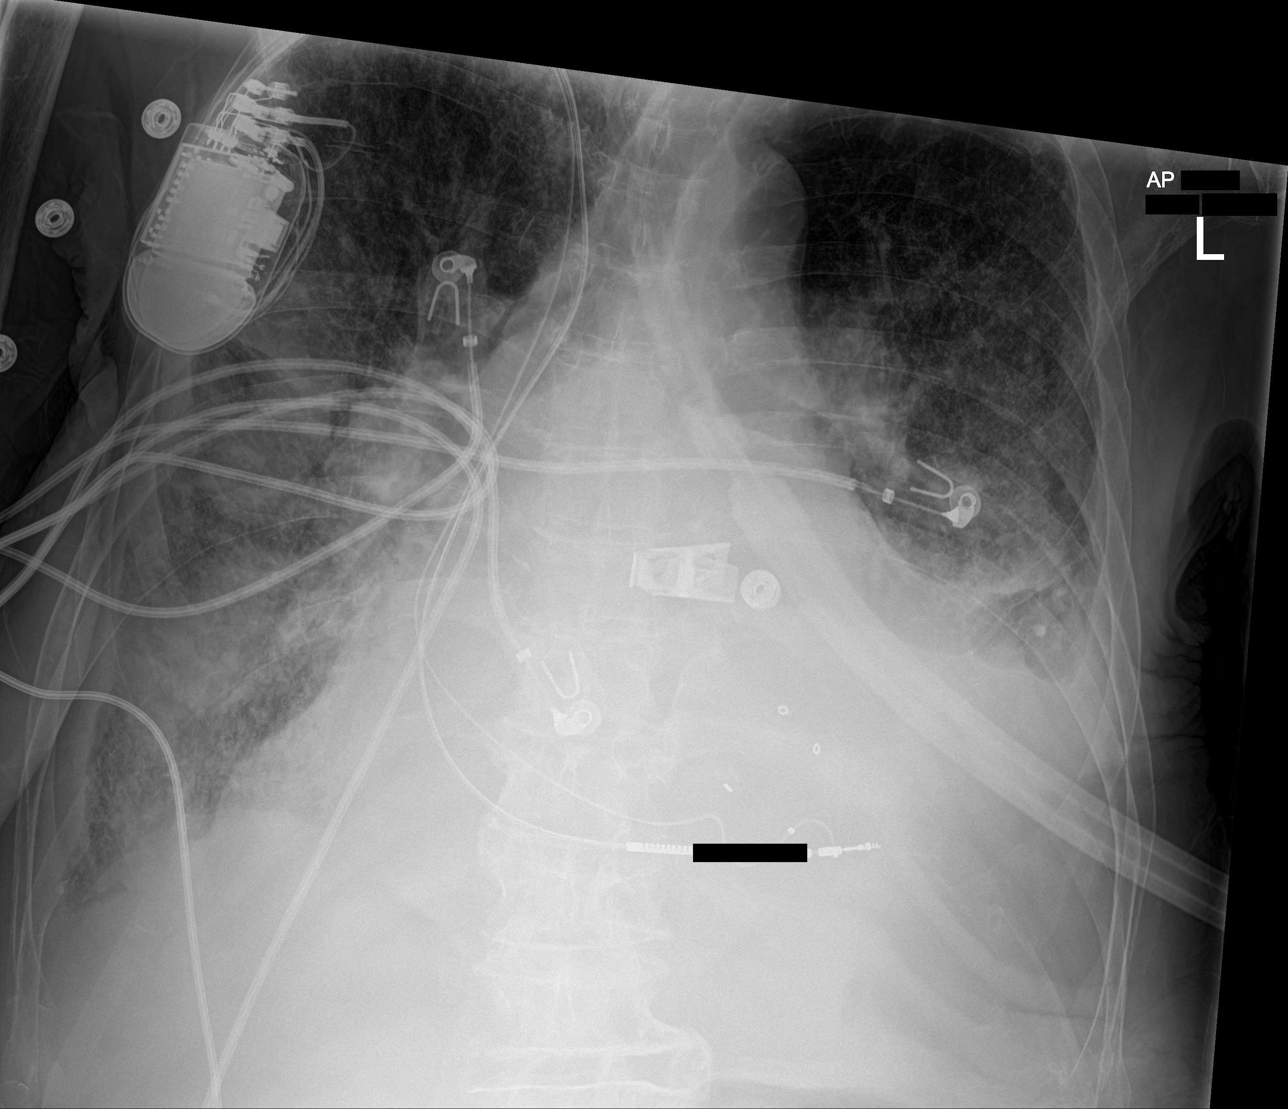

[4 of 4 positions shown; findings below may reference images not displayed]

FINDINGS: Severe diffuse bilateral airspace disease appears unchanged. Small
bilateral effusions.

Pacemaker unchanged in position.
IMPRESSION: Severe diffuse bilateral airspace disease appears unchanged.
